# Patient Record
Sex: Male | Born: 1940
Health system: Southern US, Community
[De-identification: ages and names within clinical notes are randomized; demographics above are authoritative.]

## PROBLEM LIST (undated history)

## (undated) DIAGNOSIS — J189 Pneumonia, unspecified organism: Secondary | ICD-10-CM

## (undated) DIAGNOSIS — H9190 Unspecified hearing loss, unspecified ear: Secondary | ICD-10-CM

## (undated) DIAGNOSIS — C61 Malignant neoplasm of prostate: Secondary | ICD-10-CM

## (undated) DIAGNOSIS — G8929 Other chronic pain: Secondary | ICD-10-CM

## (undated) DIAGNOSIS — G4733 Obstructive sleep apnea (adult) (pediatric): Secondary | ICD-10-CM

## (undated) DIAGNOSIS — Z7901 Long term (current) use of anticoagulants: Secondary | ICD-10-CM

## (undated) DIAGNOSIS — I712 Thoracic aortic aneurysm, without rupture, unspecified: Secondary | ICD-10-CM

## (undated) DIAGNOSIS — Z9989 Dependence on other enabling machines and devices: Secondary | ICD-10-CM

## (undated) DIAGNOSIS — M1990 Unspecified osteoarthritis, unspecified site: Secondary | ICD-10-CM

## (undated) DIAGNOSIS — I10 Essential (primary) hypertension: Secondary | ICD-10-CM

## (undated) DIAGNOSIS — I5189 Other ill-defined heart diseases: Secondary | ICD-10-CM

## (undated) DIAGNOSIS — R519 Headache, unspecified: Secondary | ICD-10-CM

## (undated) DIAGNOSIS — J449 Chronic obstructive pulmonary disease, unspecified: Secondary | ICD-10-CM

## (undated) DIAGNOSIS — I509 Heart failure, unspecified: Secondary | ICD-10-CM

## (undated) DIAGNOSIS — K589 Irritable bowel syndrome without diarrhea: Secondary | ICD-10-CM

## (undated) DIAGNOSIS — I82409 Acute embolism and thrombosis of unspecified deep veins of unspecified lower extremity: Secondary | ICD-10-CM

## (undated) DIAGNOSIS — I2699 Other pulmonary embolism without acute cor pulmonale: Secondary | ICD-10-CM

## (undated) DIAGNOSIS — Z460 Encounter for fitting and adjustment of spectacles and contact lenses: Secondary | ICD-10-CM

## (undated) DIAGNOSIS — I480 Paroxysmal atrial fibrillation: Secondary | ICD-10-CM

## (undated) DIAGNOSIS — R0602 Shortness of breath: Secondary | ICD-10-CM

## (undated) DIAGNOSIS — K219 Gastro-esophageal reflux disease without esophagitis: Secondary | ICD-10-CM

## (undated) DIAGNOSIS — M549 Dorsalgia, unspecified: Secondary | ICD-10-CM

## (undated) DIAGNOSIS — Z8711 Personal history of peptic ulcer disease: Secondary | ICD-10-CM

## (undated) DIAGNOSIS — I2510 Atherosclerotic heart disease of native coronary artery without angina pectoris: Secondary | ICD-10-CM

## (undated) DIAGNOSIS — J4520 Mild intermittent asthma, uncomplicated: Secondary | ICD-10-CM

## (undated) DIAGNOSIS — I7121 Aneurysm of the ascending aorta, without rupture: Secondary | ICD-10-CM

## (undated) DIAGNOSIS — I517 Cardiomegaly: Secondary | ICD-10-CM

## (undated) DIAGNOSIS — K635 Polyp of colon: Secondary | ICD-10-CM

## (undated) DIAGNOSIS — E119 Type 2 diabetes mellitus without complications: Secondary | ICD-10-CM

## (undated) DIAGNOSIS — I7789 Other specified disorders of arteries and arterioles: Secondary | ICD-10-CM

## (undated) DIAGNOSIS — R51 Headache: Secondary | ICD-10-CM

## (undated) DIAGNOSIS — Z8719 Personal history of other diseases of the digestive system: Secondary | ICD-10-CM

## (undated) HISTORY — PX: TONSILLECTOMY AND ADENOIDECTOMY: SUR1326

## (undated) HISTORY — DX: Aneurysm of the ascending aorta, without rupture: I71.21

## (undated) HISTORY — DX: Gastro-esophageal reflux disease without esophagitis: K21.9

## (undated) HISTORY — DX: Other specified disorders of arteries and arterioles: I77.89

## (undated) HISTORY — DX: Malignant neoplasm of prostate: C61

## (undated) HISTORY — PX: SHOULDER OPEN ROTATOR CUFF REPAIR: SHX2407

## (undated) HISTORY — DX: Thoracic aortic aneurysm, without rupture, unspecified: I71.20

## (undated) HISTORY — PX: HEMORROIDECTOMY: SUR656

## (undated) HISTORY — PX: CARPAL TUNNEL RELEASE: SHX101

## (undated) HISTORY — DX: Long term (current) use of anticoagulants: Z79.01

## (undated) HISTORY — DX: Other ill-defined heart diseases: I51.89

## (undated) HISTORY — DX: Atherosclerotic heart disease of native coronary artery without angina pectoris: I25.10

## (undated) HISTORY — PX: CATARACT EXTRACTION W/ INTRAOCULAR LENS  IMPLANT, BILATERAL: SHX1307

## (undated) HISTORY — DX: Unspecified osteoarthritis, unspecified site: M19.90

## (undated) HISTORY — PX: BACK SURGERY: SHX140

## (undated) HISTORY — PX: UVULOPALATOPHARYNGOPLASTY: SHX827

## (undated) HISTORY — PX: ACHILLES TENDON REPAIR: SUR1153

## (undated) HISTORY — PX: TRIGGER FINGER RELEASE: SHX641

## (undated) HISTORY — DX: Obstructive sleep apnea (adult) (pediatric): G47.33

## (undated) HISTORY — DX: Thoracic aortic aneurysm, without rupture: I71.2

## (undated) HISTORY — DX: Headache, unspecified: R51.9

## (undated) HISTORY — PX: LAPAROSCOPIC CHOLECYSTECTOMY: SUR755

## (undated) HISTORY — DX: Morbid (severe) obesity due to excess calories: E66.01

## (undated) HISTORY — DX: Mild intermittent asthma, uncomplicated: J45.20

## (undated) HISTORY — DX: Irritable bowel syndrome, unspecified: K58.9

## (undated) HISTORY — DX: Headache: R51

## (undated) HISTORY — DX: Paroxysmal atrial fibrillation: I48.0

## (undated) HISTORY — DX: Other pulmonary embolism without acute cor pulmonale: I26.99

## (undated) HISTORY — DX: Polyp of colon: K63.5

## (undated) HISTORY — DX: Cardiomegaly: I51.7

## (undated) HISTORY — PX: APPENDECTOMY: SHX54

## (undated) HISTORY — PX: VASECTOMY: SHX75

## (undated) HISTORY — DX: Shortness of breath: R06.02

## (undated) HISTORY — DX: Encounter for fitting and adjustment of spectacles and contact lenses: Z46.0

## (undated) HISTORY — PX: LAPAROSCOPIC GASTRIC BANDING: SHX1100

---

## 1998-07-02 ENCOUNTER — Ambulatory Visit (HOSPITAL_COMMUNITY): Admission: RE | Admit: 1998-07-02 | Discharge: 1998-07-02 | Payer: Self-pay | Admitting: Neurosurgery

## 1998-07-02 ENCOUNTER — Encounter: Payer: Self-pay | Admitting: Neurosurgery

## 1998-07-13 ENCOUNTER — Ambulatory Visit (HOSPITAL_COMMUNITY): Admission: RE | Admit: 1998-07-13 | Discharge: 1998-07-13 | Payer: Self-pay | Admitting: Neurosurgery

## 1998-07-13 ENCOUNTER — Encounter: Payer: Self-pay | Admitting: Neurosurgery

## 1998-07-28 ENCOUNTER — Ambulatory Visit (HOSPITAL_COMMUNITY): Admission: RE | Admit: 1998-07-28 | Discharge: 1998-07-28 | Payer: Self-pay | Admitting: Neurosurgery

## 1998-07-28 ENCOUNTER — Encounter: Payer: Self-pay | Admitting: Neurosurgery

## 1998-08-11 ENCOUNTER — Ambulatory Visit (HOSPITAL_COMMUNITY): Admission: RE | Admit: 1998-08-11 | Discharge: 1998-08-11 | Payer: Self-pay | Admitting: Neurosurgery

## 1998-08-11 ENCOUNTER — Encounter: Payer: Self-pay | Admitting: Neurosurgery

## 1998-09-20 ENCOUNTER — Inpatient Hospital Stay (HOSPITAL_COMMUNITY): Admission: EM | Admit: 1998-09-20 | Discharge: 1998-09-25 | Payer: Self-pay | Admitting: Emergency Medicine

## 1998-09-20 ENCOUNTER — Encounter: Payer: Self-pay | Admitting: Emergency Medicine

## 1998-09-21 ENCOUNTER — Encounter: Payer: Self-pay | Admitting: Internal Medicine

## 1998-09-22 ENCOUNTER — Encounter: Payer: Self-pay | Admitting: Internal Medicine

## 1998-10-25 ENCOUNTER — Encounter: Admission: RE | Admit: 1998-10-25 | Discharge: 1999-01-23 | Payer: Self-pay | Admitting: Emergency Medicine

## 1998-12-21 ENCOUNTER — Inpatient Hospital Stay (HOSPITAL_COMMUNITY): Admission: RE | Admit: 1998-12-21 | Discharge: 1998-12-23 | Payer: Self-pay | Admitting: Neurosurgery

## 1998-12-21 ENCOUNTER — Encounter: Payer: Self-pay | Admitting: Neurosurgery

## 1999-07-07 ENCOUNTER — Observation Stay (HOSPITAL_COMMUNITY): Admission: EM | Admit: 1999-07-07 | Discharge: 1999-07-08 | Payer: Self-pay | Admitting: Emergency Medicine

## 1999-07-07 ENCOUNTER — Encounter: Payer: Self-pay | Admitting: Emergency Medicine

## 1999-07-08 ENCOUNTER — Encounter: Payer: Self-pay | Admitting: Internal Medicine

## 1999-07-13 ENCOUNTER — Ambulatory Visit (HOSPITAL_COMMUNITY): Admission: RE | Admit: 1999-07-13 | Discharge: 1999-07-13 | Payer: Self-pay | Admitting: Gastroenterology

## 1999-10-06 ENCOUNTER — Encounter: Admission: RE | Admit: 1999-10-06 | Discharge: 1999-10-06 | Payer: Self-pay | Admitting: Neurosurgery

## 1999-10-06 ENCOUNTER — Encounter: Payer: Self-pay | Admitting: Neurosurgery

## 2000-01-30 ENCOUNTER — Encounter: Payer: Self-pay | Admitting: Neurosurgery

## 2000-01-30 ENCOUNTER — Encounter: Admission: RE | Admit: 2000-01-30 | Discharge: 2000-01-30 | Payer: Self-pay | Admitting: Neurosurgery

## 2000-02-02 ENCOUNTER — Encounter: Admission: RE | Admit: 2000-02-02 | Discharge: 2000-02-02 | Payer: Self-pay | Admitting: Neurosurgery

## 2000-02-02 ENCOUNTER — Encounter: Payer: Self-pay | Admitting: Neurosurgery

## 2000-02-03 ENCOUNTER — Encounter: Payer: Self-pay | Admitting: Neurosurgery

## 2000-02-07 ENCOUNTER — Inpatient Hospital Stay (HOSPITAL_COMMUNITY): Admission: RE | Admit: 2000-02-07 | Discharge: 2000-02-13 | Payer: Self-pay | Admitting: Neurosurgery

## 2000-02-07 ENCOUNTER — Encounter: Payer: Self-pay | Admitting: Neurosurgery

## 2000-02-08 ENCOUNTER — Encounter: Payer: Self-pay | Admitting: Neurosurgery

## 2000-03-13 ENCOUNTER — Encounter: Payer: Self-pay | Admitting: Emergency Medicine

## 2000-03-13 ENCOUNTER — Encounter: Admission: RE | Admit: 2000-03-13 | Discharge: 2000-03-13 | Payer: Self-pay | Admitting: Emergency Medicine

## 2000-06-19 ENCOUNTER — Encounter: Admission: RE | Admit: 2000-06-19 | Discharge: 2000-08-22 | Payer: Self-pay | Admitting: Emergency Medicine

## 2000-12-24 ENCOUNTER — Encounter (HOSPITAL_COMMUNITY): Admission: RE | Admit: 2000-12-24 | Discharge: 2001-03-24 | Payer: Self-pay | Admitting: Cardiology

## 2001-02-19 ENCOUNTER — Encounter: Payer: Self-pay | Admitting: Orthopedic Surgery

## 2001-02-19 ENCOUNTER — Encounter: Admission: RE | Admit: 2001-02-19 | Discharge: 2001-02-19 | Payer: Self-pay | Admitting: Orthopedic Surgery

## 2001-10-31 ENCOUNTER — Ambulatory Visit (HOSPITAL_COMMUNITY): Admission: RE | Admit: 2001-10-31 | Discharge: 2001-10-31 | Payer: Self-pay | Admitting: Gastroenterology

## 2001-10-31 ENCOUNTER — Encounter (INDEPENDENT_AMBULATORY_CARE_PROVIDER_SITE_OTHER): Payer: Self-pay | Admitting: Specialist

## 2002-03-06 ENCOUNTER — Ambulatory Visit (HOSPITAL_COMMUNITY): Admission: RE | Admit: 2002-03-06 | Discharge: 2002-03-06 | Payer: Self-pay | Admitting: Gastroenterology

## 2002-03-06 ENCOUNTER — Encounter (INDEPENDENT_AMBULATORY_CARE_PROVIDER_SITE_OTHER): Payer: Self-pay | Admitting: *Deleted

## 2002-06-06 ENCOUNTER — Encounter: Payer: Self-pay | Admitting: Emergency Medicine

## 2002-06-06 ENCOUNTER — Emergency Department (HOSPITAL_COMMUNITY): Admission: EM | Admit: 2002-06-06 | Discharge: 2002-06-06 | Payer: Self-pay | Admitting: Podiatry

## 2002-07-15 ENCOUNTER — Encounter: Payer: Self-pay | Admitting: Orthopedic Surgery

## 2002-07-15 ENCOUNTER — Encounter: Admission: RE | Admit: 2002-07-15 | Discharge: 2002-07-15 | Payer: Self-pay | Admitting: Orthopedic Surgery

## 2003-01-19 ENCOUNTER — Encounter: Admission: RE | Admit: 2003-01-19 | Discharge: 2003-01-19 | Payer: Self-pay | Admitting: Orthopedic Surgery

## 2003-05-11 ENCOUNTER — Encounter: Admission: RE | Admit: 2003-05-11 | Discharge: 2003-05-11 | Payer: Self-pay | Admitting: Neurosurgery

## 2003-06-01 ENCOUNTER — Encounter: Admission: RE | Admit: 2003-06-01 | Discharge: 2003-06-01 | Payer: Self-pay | Admitting: Neurosurgery

## 2003-06-24 ENCOUNTER — Encounter: Admission: RE | Admit: 2003-06-24 | Discharge: 2003-07-22 | Payer: Self-pay | Admitting: Neurosurgery

## 2004-01-24 HISTORY — PX: CARDIAC CATHETERIZATION: SHX172

## 2004-02-12 ENCOUNTER — Inpatient Hospital Stay (HOSPITAL_BASED_OUTPATIENT_CLINIC_OR_DEPARTMENT_OTHER): Admission: RE | Admit: 2004-02-12 | Discharge: 2004-02-12 | Payer: Self-pay | Admitting: Cardiology

## 2004-12-23 ENCOUNTER — Encounter: Admission: RE | Admit: 2004-12-23 | Discharge: 2004-12-23 | Payer: Self-pay | Admitting: Cardiology

## 2005-01-13 ENCOUNTER — Encounter: Admission: RE | Admit: 2005-01-13 | Discharge: 2005-01-13 | Payer: Self-pay | Admitting: Orthopedic Surgery

## 2005-01-24 ENCOUNTER — Encounter: Admission: RE | Admit: 2005-01-24 | Discharge: 2005-01-24 | Payer: Self-pay | Admitting: Orthopedic Surgery

## 2005-07-04 ENCOUNTER — Ambulatory Visit (HOSPITAL_COMMUNITY): Admission: RE | Admit: 2005-07-04 | Discharge: 2005-07-04 | Payer: Self-pay | Admitting: Neurosurgery

## 2005-07-13 ENCOUNTER — Encounter: Admission: RE | Admit: 2005-07-13 | Discharge: 2005-07-13 | Payer: Self-pay | Admitting: Neurosurgery

## 2005-08-03 ENCOUNTER — Encounter: Admission: RE | Admit: 2005-08-03 | Discharge: 2005-08-03 | Payer: Self-pay | Admitting: Neurosurgery

## 2006-01-23 DIAGNOSIS — I2699 Other pulmonary embolism without acute cor pulmonale: Secondary | ICD-10-CM

## 2006-01-23 HISTORY — DX: Other pulmonary embolism without acute cor pulmonale: I26.99

## 2006-03-08 ENCOUNTER — Inpatient Hospital Stay (HOSPITAL_COMMUNITY): Admission: EM | Admit: 2006-03-08 | Discharge: 2006-03-14 | Payer: Self-pay | Admitting: Emergency Medicine

## 2006-05-05 ENCOUNTER — Encounter: Admission: RE | Admit: 2006-05-05 | Discharge: 2006-05-05 | Payer: Self-pay | Admitting: Orthopedic Surgery

## 2006-08-17 ENCOUNTER — Inpatient Hospital Stay (HOSPITAL_COMMUNITY): Admission: EM | Admit: 2006-08-17 | Discharge: 2006-08-25 | Payer: Self-pay | Admitting: Emergency Medicine

## 2006-08-24 ENCOUNTER — Encounter (INDEPENDENT_AMBULATORY_CARE_PROVIDER_SITE_OTHER): Payer: Self-pay | Admitting: Gastroenterology

## 2006-08-31 ENCOUNTER — Observation Stay (HOSPITAL_COMMUNITY): Admission: EM | Admit: 2006-08-31 | Discharge: 2006-08-31 | Payer: Self-pay | Admitting: Emergency Medicine

## 2006-11-08 ENCOUNTER — Encounter: Admission: RE | Admit: 2006-11-08 | Discharge: 2006-11-08 | Payer: Self-pay | Admitting: Cardiology

## 2006-11-21 ENCOUNTER — Ambulatory Visit (HOSPITAL_COMMUNITY): Admission: RE | Admit: 2006-11-21 | Discharge: 2006-11-21 | Payer: Self-pay | Admitting: Surgery

## 2006-11-27 ENCOUNTER — Encounter: Admission: RE | Admit: 2006-11-27 | Discharge: 2006-11-27 | Payer: Self-pay | Admitting: Surgery

## 2007-02-11 ENCOUNTER — Encounter: Admission: RE | Admit: 2007-02-11 | Discharge: 2007-02-11 | Payer: Self-pay | Admitting: Cardiology

## 2007-05-16 ENCOUNTER — Encounter: Admission: RE | Admit: 2007-05-16 | Discharge: 2007-08-14 | Payer: Self-pay | Admitting: Surgery

## 2007-05-24 ENCOUNTER — Ambulatory Visit: Payer: Self-pay | Admitting: Surgery

## 2007-05-24 ENCOUNTER — Ambulatory Visit (HOSPITAL_COMMUNITY): Admission: RE | Admit: 2007-05-24 | Discharge: 2007-05-24 | Payer: Self-pay | Admitting: Surgery

## 2007-05-27 ENCOUNTER — Ambulatory Visit (HOSPITAL_COMMUNITY): Admission: RE | Admit: 2007-05-27 | Discharge: 2007-05-28 | Payer: Self-pay | Admitting: Surgery

## 2007-07-01 ENCOUNTER — Ambulatory Visit: Payer: Self-pay | Admitting: Surgery

## 2007-07-14 ENCOUNTER — Inpatient Hospital Stay (HOSPITAL_COMMUNITY): Admission: EM | Admit: 2007-07-14 | Discharge: 2007-07-16 | Payer: Self-pay | Admitting: Emergency Medicine

## 2007-08-15 ENCOUNTER — Ambulatory Visit (HOSPITAL_COMMUNITY): Admission: RE | Admit: 2007-08-15 | Discharge: 2007-08-15 | Payer: Self-pay | Admitting: Surgery

## 2007-08-15 ENCOUNTER — Ambulatory Visit: Payer: Self-pay | Admitting: Surgery

## 2007-11-27 ENCOUNTER — Encounter: Admission: RE | Admit: 2007-11-27 | Discharge: 2007-11-27 | Payer: Self-pay | Admitting: Neurosurgery

## 2007-12-11 ENCOUNTER — Inpatient Hospital Stay (HOSPITAL_COMMUNITY): Admission: RE | Admit: 2007-12-11 | Discharge: 2007-12-15 | Payer: Self-pay | Admitting: Neurosurgery

## 2008-08-06 ENCOUNTER — Ambulatory Visit: Payer: Self-pay | Admitting: Pulmonary Disease

## 2008-08-06 DIAGNOSIS — Z8679 Personal history of other diseases of the circulatory system: Secondary | ICD-10-CM | POA: Insufficient documentation

## 2008-08-06 DIAGNOSIS — Z8672 Personal history of thrombophlebitis: Secondary | ICD-10-CM | POA: Insufficient documentation

## 2008-08-06 DIAGNOSIS — I509 Heart failure, unspecified: Secondary | ICD-10-CM | POA: Insufficient documentation

## 2008-08-06 DIAGNOSIS — R51 Headache: Secondary | ICD-10-CM | POA: Insufficient documentation

## 2008-08-06 DIAGNOSIS — G4733 Obstructive sleep apnea (adult) (pediatric): Secondary | ICD-10-CM | POA: Insufficient documentation

## 2008-08-06 DIAGNOSIS — I10 Essential (primary) hypertension: Secondary | ICD-10-CM | POA: Insufficient documentation

## 2008-08-06 DIAGNOSIS — Z86718 Personal history of other venous thrombosis and embolism: Secondary | ICD-10-CM | POA: Insufficient documentation

## 2008-08-06 DIAGNOSIS — R519 Headache, unspecified: Secondary | ICD-10-CM | POA: Insufficient documentation

## 2008-08-11 ENCOUNTER — Encounter: Payer: Self-pay | Admitting: Pulmonary Disease

## 2008-08-24 ENCOUNTER — Encounter: Payer: Self-pay | Admitting: Pulmonary Disease

## 2008-09-11 ENCOUNTER — Encounter: Payer: Self-pay | Admitting: Pulmonary Disease

## 2008-09-16 ENCOUNTER — Encounter: Payer: Self-pay | Admitting: Pulmonary Disease

## 2008-10-14 ENCOUNTER — Encounter: Payer: Self-pay | Admitting: Pulmonary Disease

## 2008-10-19 ENCOUNTER — Encounter: Payer: Self-pay | Admitting: Pulmonary Disease

## 2008-10-21 ENCOUNTER — Ambulatory Visit: Payer: Self-pay | Admitting: Pulmonary Disease

## 2008-10-21 DIAGNOSIS — J4530 Mild persistent asthma, uncomplicated: Secondary | ICD-10-CM | POA: Insufficient documentation

## 2009-04-19 ENCOUNTER — Ambulatory Visit: Payer: Self-pay | Admitting: Pulmonary Disease

## 2009-05-26 ENCOUNTER — Encounter: Admission: RE | Admit: 2009-05-26 | Discharge: 2009-05-26 | Payer: Self-pay | Admitting: Cardiology

## 2009-05-27 ENCOUNTER — Encounter: Admission: RE | Admit: 2009-05-27 | Discharge: 2009-05-27 | Payer: Self-pay | Admitting: Neurosurgery

## 2009-06-01 ENCOUNTER — Encounter: Admission: RE | Admit: 2009-06-01 | Discharge: 2009-06-01 | Payer: Self-pay | Admitting: Neurosurgery

## 2009-06-17 ENCOUNTER — Ambulatory Visit: Payer: Self-pay | Admitting: Cardiothoracic Surgery

## 2009-06-29 ENCOUNTER — Encounter: Admission: RE | Admit: 2009-06-29 | Discharge: 2009-06-29 | Payer: Self-pay | Admitting: Internal Medicine

## 2009-08-10 ENCOUNTER — Encounter: Admission: RE | Admit: 2009-08-10 | Discharge: 2009-08-10 | Payer: Self-pay | Admitting: Neurosurgery

## 2009-08-31 ENCOUNTER — Encounter: Admission: RE | Admit: 2009-08-31 | Discharge: 2009-08-31 | Payer: Self-pay | Admitting: Neurosurgery

## 2009-09-06 ENCOUNTER — Ambulatory Visit: Payer: Self-pay | Admitting: Cardiology

## 2009-09-29 ENCOUNTER — Ambulatory Visit: Payer: Self-pay | Admitting: Cardiology

## 2009-09-29 ENCOUNTER — Ambulatory Visit (HOSPITAL_COMMUNITY): Admission: RE | Admit: 2009-09-29 | Discharge: 2009-09-29 | Payer: Self-pay | Admitting: Cardiology

## 2009-10-01 ENCOUNTER — Encounter: Admission: RE | Admit: 2009-10-01 | Discharge: 2009-10-01 | Payer: Self-pay | Admitting: Cardiology

## 2009-10-11 ENCOUNTER — Ambulatory Visit: Payer: Self-pay | Admitting: Cardiology

## 2009-10-19 ENCOUNTER — Ambulatory Visit: Payer: Self-pay | Admitting: Pulmonary Disease

## 2009-10-22 ENCOUNTER — Encounter: Admission: RE | Admit: 2009-10-22 | Discharge: 2009-10-22 | Payer: Self-pay | Admitting: Surgery

## 2009-10-29 ENCOUNTER — Ambulatory Visit: Payer: Self-pay | Admitting: Cardiology

## 2009-11-15 ENCOUNTER — Encounter: Payer: Self-pay | Admitting: Pulmonary Disease

## 2009-11-16 ENCOUNTER — Encounter
Admission: RE | Admit: 2009-11-16 | Discharge: 2009-11-16 | Payer: Self-pay | Source: Home / Self Care | Attending: Surgery | Admitting: Surgery

## 2009-12-01 ENCOUNTER — Encounter: Payer: Self-pay | Admitting: Pulmonary Disease

## 2009-12-02 ENCOUNTER — Ambulatory Visit: Payer: Self-pay | Admitting: Cardiothoracic Surgery

## 2009-12-07 ENCOUNTER — Ambulatory Visit: Payer: Self-pay | Admitting: Pulmonary Disease

## 2009-12-20 ENCOUNTER — Encounter: Admission: RE | Admit: 2009-12-20 | Discharge: 2009-12-20 | Payer: Self-pay | Admitting: Neurosurgery

## 2010-02-13 ENCOUNTER — Encounter: Payer: Self-pay | Admitting: Neurosurgery

## 2010-02-24 NOTE — Assessment & Plan Note (Signed)
Summary: 6 months/apc   Copy to:  Self Referral- Re- Establish Primary Provider/Referring Provider:  [name]  CC:  OSA follow-up.  The patient states he is doing well on CPAP. He has not been able to wear it since having chest and nasal congestion. He c/o cough with thick yellow mucus..  History of Present Illness: 70 yo male f/u for OSA on BPAP 12/9, and obesity s/l lap band surgery in 2009.  He went to [place] on March 4 for a church mission.  He developed an upper respiratory infection while there, and has not fully recovered.  He has been on two different courses of antibiotics.  He does feel like he is slowly improving.  He continues to have sinus drainage.  He has been using flonase.  He has also been having some wheeze, and chest congestion.  He denies fever, sweats, or hemoptysis.  He has not been able to use his BPAP for the past several weeks due to his sinus problems.  He is sleeping okay.  He is not sure if he is snoring at night.  He feels okay during the day.  He has lost about 10 lbs since his last visit.  Current Medications (verified): 1)  Atenolol 25 Mg Tabs (Atenolol) .... Take 1 Tablet By Mouth Once A Day 2)  Diovan 80 Mg Tabs (Valsartan) .... Take 1 Tablet By Mouth Once A Day 3)  Lasix 80 Mg Tabs (Furosemide) .... Take 2 Tabs By Mouth Daily 4)  Potassium Chloride .... Take 2 Tabs By Mouth Daily 5)  Flomax 0.4 Mg Xr24h-Cap (Tamsulosin Hcl) .... Take 1 Tablet By Mouth Once A Day 6)  Cipro 500 Mg Tabs (Ciprofloxacin Hcl) .Marland Kitchen.. 1 By Mouth Two Times A Day 7)  Claritin-D 12 Hour 5-120 Mg Xr12h-Tab (Loratadine-Pseudoephedrine) .Marland Kitchen.. 1 By Mouth Daily  Allergies (verified): 1)  ! Morphine 2)  ! Adhesive Tape  Past History:  Past Medical History: Reviewed history from 10/21/2008 and no changes required. CURRENT PROBLEMS:  Obstructive sleep apnea      - PSG 03/30/97 AHI 21      - BPAP 12/9 Chronic rhinitis  Chronic hoarseness Coronary artery  disease Hypertension Diastolic heart failure DVT, PE Pneumonia GERD Peptic ulcer disease Esophageal stricture Hiatal hernia Irritable bowel syndrome Chronic headaches Osteoarthritis Cervical spine disease  Past Surgical History: Reviewed history from 08/06/2008 and no changes required. hemorrhoidectomy in 1963 appendectomy in 1967 tonsillectomy at age 40 Posterior cervical diskectomy 1989 bilateral rotator cuff repair 1995 Left elbow spur 1995 carpal tunnel repair, right hand Cholecystectomy 1994 status post vasectomy Uvulopharygoplasty in 1990's L4-L5 laminectomy 2000 L3-S1 surgery 2002, 2009 Lap band 2009  Vital Signs:  Patient profile:   70 year old male Height:      67 inches (170.18 cm) Weight:      270 pounds (122.73 kg) BMI:     42.44 O2 Sat:      97 % on Room air Temp:     97.7 degrees F (36.50 degrees C) oral Pulse rate:   74 / minute BP sitting:   122 / 76  (left arm) Cuff size:   large  Vitals Entered By: Michel Bickers CMA (April 19, 2009 1:28 PM)  O2 Sat at Rest %:  97 O2 Flow:  Room air  Physical Exam  Nose:  boggy, clear, non tender Mouth:  MP 3, no oral lesion, triangular uvula Neck:  no JVD, no thyromegaly Lungs:  b/l wheeze Lt > Rt at bases  clear partially with cough Heart:  regular rate and rhythm, S1, S2 without murmurs, rubs, gallops, or clicks Extremities:  no clubbing, cyanosis, edema, or deformity noted Cervical Nodes:  no significant adenopathy   Impression & Recommendations:  Problem # 1:  OBSTRUCTIVE SLEEP APNEA (ICD-327.23)  He is to resume BPAP once his sinus problems improve.  I have advised him to continue with his weight loss.  He may need to have this re-evaluated if he continues to lose weight.   Problem # 2:  DYSPNEA (ICD-786.05)  He may have a component of asthma exacerbated by recent upper airway infection.  I have given him a sample and script for xopenex.  He may need additional therapy for asthma, depending on his  response.  Advised him to follow up with his primary physician, or he could call for further pulmonary assessment of this.  Medications Added to Medication List This Visit: 1)  Xopenex Hfa 45 Mcg/act Aero (Levalbuterol tartrate) .... Two puffs up to four times per day as needed 2)  Claritin-d 12 Hour 5-120 Mg Xr12h-tab (Loratadine-pseudoephedrine) .Marland Kitchen.. 1 by mouth daily 3)  Flonase 50 Mcg/act Susp (Fluticasone propionate) .... Two sprays once daily as needed 4)  Cipro 500 Mg Tabs (Ciprofloxacin hcl) .Marland Kitchen.. 1 by mouth two times a day  Complete Medication List: 1)  Atenolol 25 Mg Tabs (Atenolol) .... Take 1 tablet by mouth once a day 2)  Diovan 80 Mg Tabs (Valsartan) .... Take 1 tablet by mouth once a day 3)  Lasix 80 Mg Tabs (Furosemide) .... Take 2 tabs by mouth daily 4)  Potassium Chloride  .... Take 2 tabs by mouth daily 5)  Flomax 0.4 Mg Xr24h-cap (Tamsulosin hcl) .... Take 1 tablet by mouth once a day 6)  Xopenex Hfa 45 Mcg/act Aero (Levalbuterol tartrate) .... Two puffs up to four times per day as needed 7)  Claritin-d 12 Hour 5-120 Mg Xr12h-tab (Loratadine-pseudoephedrine) .Marland Kitchen.. 1 by mouth daily 8)  Flonase 50 Mcg/act Susp (Fluticasone propionate) .... Two sprays once daily as needed 9)  Cipro 500 Mg Tabs (Ciprofloxacin hcl) .Marland Kitchen.. 1 by mouth two times a day  Other Orders: Est. Patient Level III (04540)  Patient Instructions: 1)  Xopenex two puffs up to four times per day as needed  2)  Follow up in 6 months Prescriptions: XOPENEX HFA 45 MCG/ACT AERO (LEVALBUTEROL TARTRATE) two puffs up to four times per day as needed  #1 x 2   Entered and Authorized by:   Coralyn Helling MD   Signed by:   Coralyn Helling MD on 04/19/2009   Method used:   Print then Give to Patient   RxID:   [id number]

## 2010-02-24 NOTE — Assessment & Plan Note (Signed)
Summary: 2-3 months/apc   Copy to:    Primary Provider/Referring Provider:  Lajean Manes  CC:  2-3 month OSA follow-up.  Pt says he is doing well on Bipap.  Wakes up feeling fatigued on rare occasions..  History of Present Illness: 70 yo male f/u for OSA on BPAP 12/9, and obesity s/l lap band surgery in 2009.  He has been doing well with BPAP.  He has a full face mask.  He gets mouth dryness at night, but this was present even before he started BPAP.  He sleeps well, and has more energy level.  He is continuing with his weight loss regimen.  [place]  He was at about 4000 [place] elevation.  He has some trouble with his breathing while walking up stairs.  He did not have this problem after returning to lower elevation.    Current Medications (verified): 1)  Atenolol 25 Mg Tabs (Atenolol) .... Take 1 Tablet By Mouth Once A Day 2)  Diovan 80 Mg Tabs (Valsartan) .... Take 1 Tablet By Mouth Once A Day 3)  Lasix 80 Mg Tabs (Furosemide) .... Take 2 Tabs By Mouth Daily 4)  Potassium Chloride .... Take 2 Tabs By Mouth Daily 5)  Flomax 0.4 Mg Xr24h-Cap (Tamsulosin Hcl) .... Take 1 Tablet By Mouth Once A Day  Allergies (verified): 1)  ! Morphine 2)  ! Adhesive Tape  Past History:  Past Medical History: CURRENT PROBLEMS:  Obstructive sleep apnea      - PSG 03/30/97 AHI 21      - BPAP 12/9 Chronic rhinitis  Chronic hoarseness Coronary artery disease Hypertension Diastolic heart failure DVT, PE Pneumonia GERD Peptic ulcer disease Esophageal stricture Hiatal hernia Irritable bowel syndrome Chronic headaches Osteoarthritis Cervical spine disease  Past Surgical History: Reviewed history from 08/06/2008 and no changes required. hemorrhoidectomy in 1963 appendectomy in 1967 tonsillectomy at age 32 Posterior cervical diskectomy 1989 bilateral rotator cuff repair 1995 Left elbow spur 1995 carpal tunnel repair, right hand Cholecystectomy 1994  status post vasectomy Uvulopharygoplasty in 1990's L4-L5 laminectomy 2000 L3-S1 surgery 2002, 2009 Lap band 2009  Family History: Reviewed history from 08/06/2008 and no changes required. emphysema-father (also had brown lung)  heart disease-sister  cancer-brother(started in bladder) sister (unsure what kind)   Social History: Reviewed history from 08/06/2008 and no changes required. Married.  Has 3 sons.  Retired from [employer].  Smoked 2.5 to 3 packs per day, started at age 80 and quit in 89.  Vital Signs:  Patient profile:   70 year old male Height:      67 inches (170.18 cm) Weight:      276 pounds (125.45 kg) BMI:     43.38 O2 Sat:      94 % on Room air Temp:     97.7 degrees F (36.50 degrees C) oral Pulse rate:   58 / minute BP sitting:   118 / 66  (left arm) Cuff size:   large  Vitals Entered By: Michel Bickers CMA (October 21, 2008 1:45 PM)  O2 Flow:  Room air  Physical Exam  General:  obese.   Nose:  no deformity, discharge, inflammation, or lesions Mouth:  MP 3, no oral lesion Neck:  no JVD, no thyromegaly Lungs:  clear bilaterally to auscultation and percussion Heart:  regular rate and rhythm, S1, S2 without murmurs, rubs, gallops, or clicks Abdomen:  bowel sounds positive; abdomen soft and non-tender without masses, or organomegaly Extremities:  no  clubbing, cyanosis, edema, or deformity noted Cervical Nodes:  no significant adenopathy   Impression & Recommendations:  Problem # 1:  OBSTRUCTIVE SLEEP APNEA (ICD-327.23)  Continue on BPAP.  He is to continue with his weight loss regimen  Problem # 2:  DYSPNEA (ICD-786.05) My suspicion is this was from being at elevation coupled with a degree of deconditioning.  I have advised him to contact his PCP if this recurs.  Complete Medication List: 1)  Atenolol 25 Mg Tabs (Atenolol) .... Take 1 tablet by mouth once a day 2)  Diovan 80 Mg Tabs (Valsartan) .... Take 1 tablet by mouth once a day 3)   Lasix 80 Mg Tabs (Furosemide) .... Take 2 tabs by mouth daily 4)  Potassium Chloride  .... Take 2 tabs by mouth daily 5)  Flomax 0.4 Mg Xr24h-cap (Tamsulosin hcl) .... Take 1 tablet by mouth once a day  Other Orders: Est. Patient Level II (29562)  Patient Instructions: 1)  Follow up in 6 months

## 2010-02-24 NOTE — Miscellaneous (Signed)
Summary: BPAP 12/9 download 10/27/09 to 11/15/09  Clinical Lists Changes Used on 19 of 20 nights with average 7hrs 12 min.    Average AHI 4.3 with minimal airleak with BPAP 12/9.    Will have my nurse call to inform pt that BPAP report looked good, and sleep apnea is controlled well with BPAP.  He should continue to use BPAP on a regular basis.  Appended Document: BPAP 12/9 download 10/27/09 to 11/15/09 LMOMTCB.  Appended Document: BPAP 12/9 download 10/27/09 to 11/15/09 PAtient is aware bpap report was good and he should continue wearing on a regular basis per VS.

## 2010-02-24 NOTE — Miscellaneous (Signed)
Summary: Auto-BPAP download 08/11/08 to 08/24/08.  Clinical Lists Changes Used on 13 of 14 days with average 6 hrs 9 min.  Optimal pressure 12/9 cm H2O.  Will set at this pressure, get repeat download on set pressure, and have ONO done. Orders: Added new Referral order of DME Referral (DME) - Signed

## 2010-02-24 NOTE — Procedures (Signed)
Summary: Oximetry/[facility]  Oximetry/[facility]   Imported By: Sherian Rein 10/07/2008 09:12:24  _____________________________________________________________________  External Attachment:    Type:   Image     Comment:   External Document

## 2010-02-24 NOTE — Assessment & Plan Note (Signed)
Summary: 2 MONTHS/ MBW   Visit Type:  Follow-up Copy to:  Hilda Lias, Roger Shelter, Sheliah Plane Primary Provider/Referring Provider:  Johnella Moloney  CC:  OSA follow-up...review vpap download...pt c/o moisture inside his mask.  History of Present Illness: 70 yo male f/u for OSA on BPAP 13/9, and obesity s/l lap band surgery in 2009.  He has been sleeping better.  He is using his BPAP about 6 hours per night.  He goes to bed at 11pm, and wakes up at 5am.  He stays in bed until 6am.  He gets sleepy in the afternoon and will take a brief nap.  He has been getting trouble with water build up in his mask, but has been working with his DME.  He is using a full face mask and this has been working okay.  BPAP download from 11/16/09 to 12/01/09: Used on 11 of 16 nights.  Average 4hrs 28 min per night.  Optimal pressure 13/9 with average AHI 6.  Minimal airleak.  Current Medications (verified): 1)  Atenolol 25 Mg Tabs (Atenolol) .... Take 1 Tablet By Mouth Once A Day 2)  Valturna 150-160 Mg Tabs (Aliskiren-Valsartan) .Marland Kitchen.. 1 By Mouth Two Times A Day 3)  Lasix 80 Mg Tabs (Furosemide) .... Take 2 Tabs By Mouth Daily 4)  Klor-Con M20 20 Meq Cr-Tabs (Potassium Chloride Crys Cr) .... 2 By Mouth Two Times A Day 5)  Flomax 0.4 Mg Xr24h-Cap (Tamsulosin Hcl) .... Take 1 Tablet By Mouth Once A Day 6)  Xopenex Hfa 45 Mcg/act Aero (Levalbuterol Tartrate) .... Two Puffs Up To Four Times Per Day As Needed 7)  Flonase 50 Mcg/act Susp (Fluticasone Propionate) .... Two Sprays Once Daily As Needed 8)  Amlodipine Besylate 10 Mg Tabs (Amlodipine Besylate) .Marland Kitchen.. 1 By Mouth Daily 9)  Pantoprazole Sodium 40 Mg Tbec (Pantoprazole Sodium) .Marland Kitchen.. 1 By Mouth Daily As Needed 10)  Finasteride 5 Mg Tabs (Finasteride) .Marland Kitchen.. 1 By Mouth Daily  Allergies (verified): 1)  ! Morphine 2)  ! Adhesive Tape  Past History:  Past Medical History: CURRENT PROBLEMS:  Obstructive sleep apnea      - PSG 03/30/97 AHI 21      -  BPAP 13/9 Chronic rhinitis  Chronic hoarseness Coronary artery disease Hypertension Diastolic heart failure DVT, PE Ascending thoracic aortic aneurysm>>Dr. [name] Pneumonia GERD Peptic ulcer disease Esophageal stricture Hiatal hernia Irritable bowel syndrome Chronic headaches Osteoarthritis Cervical spine disease  Past Surgical History: Reviewed history from 08/06/2008 and no changes required. hemorrhoidectomy in 1963 appendectomy in 1967 tonsillectomy at age 12 Posterior cervical diskectomy 1989 bilateral rotator cuff repair 1995 Left elbow spur 1995 carpal tunnel repair, right hand Cholecystectomy 1994 status post vasectomy Uvulopharygoplasty in 1990's L4-L5 laminectomy 2000 L3-S1 surgery 2002, 2009 Lap band 2009  Vital Signs:  Patient profile:   70 year old male Height:      67 inches (170.18 cm) Weight:      276 pounds (125.45 kg) BMI:     43.38 O2 Sat:      95 % on Room air Temp:     98.1 degrees F (36.72 degrees C) oral Pulse rate:   56 / minute BP sitting:   120 / 76  (left arm) Cuff size:   large  Vitals Entered By: Michel Bickers CMA (December 07, 2009 3:33 PM)  O2 Sat at Rest %:  95 O2 Flow:  Room air CC: OSA follow-up...review vpap download...pt c/o moisture inside his mask Comments Medications reviewed with patient Michel Bickers  CMA  December 07, 2009 3:34 PM   Physical Exam  General:  obese.   Nose:  clear, non tender Mouth:  MP 3, no oral lesion, triangular uvula Neck:  no JVD.   Lungs:  clear bilaterally to auscultation and percussion Heart:  regular rate and rhythm, S1, S2 without murmurs, rubs, gallops, or clicks Extremities:  no clubbing, cyanosis, edema, or deformity noted Neurologic:  normal CN II-XII and strength normal.   Cervical Nodes:  no significant adenopathy Psych:  alert and cooperative; normal mood and affect; normal attention span and concentration   Impression & Recommendations:  Problem # 1:  OBSTRUCTIVE SLEEP APNEA  (ICD-327.23)  He has done better with BPAP.  His recent download shows good control.  Will increase his settings to 13/9.  He has been working with his DME about adjusting rain out in his mask.  He is being evaluated for back surgery.  I don't think his sleep apnea would prohibit him from having surgery.  He will need to continue BPAP during the post-operative period.  He will need to have close monitoring of his oxygen during the post-operative period.  Pulmonary service can be available as needed in the post-operative period.  Medications Added to Medication List This Visit: 1)  Klor-con M20 20 Meq Cr-tabs (Potassium chloride crys cr) .... 2 by mouth two times a day 2)  Pantoprazole Sodium 40 Mg Tbec (Pantoprazole sodium) .Marland Kitchen.. 1 by mouth daily as needed 3)  Finasteride 5 Mg Tabs (Finasteride) .Marland Kitchen.. 1 by mouth daily  Complete Medication List: 1)  Atenolol 25 Mg Tabs (Atenolol) .... Take 1 tablet by mouth once a day 2)  Valturna 150-160 Mg Tabs (Aliskiren-valsartan) .Marland Kitchen.. 1 by mouth two times a day 3)  Lasix 80 Mg Tabs (Furosemide) .... Take 2 tabs by mouth daily 4)  Klor-con M20 20 Meq Cr-tabs (Potassium chloride crys cr) .... 2 by mouth two times a day 5)  Flomax 0.4 Mg Xr24h-cap (Tamsulosin hcl) .... Take 1 tablet by mouth once a day 6)  Xopenex Hfa 45 Mcg/act Aero (Levalbuterol tartrate) .... Two puffs up to four times per day as needed 7)  Flonase 50 Mcg/act Susp (Fluticasone propionate) .... Two sprays once daily as needed 8)  Amlodipine Besylate 10 Mg Tabs (Amlodipine besylate) .Marland Kitchen.. 1 by mouth daily 9)  Pantoprazole Sodium 40 Mg Tbec (Pantoprazole sodium) .Marland Kitchen.. 1 by mouth daily as needed 10)  Finasteride 5 Mg Tabs (Finasteride) .Marland Kitchen.. 1 by mouth daily  Other Orders: Est. Patient Level III (11914) DME Referral (DME)  Patient Instructions: 1)  Follow up in 6 months   Immunization History:  Influenza Immunization History:    Influenza:  historical (09/23/2009)

## 2010-02-24 NOTE — Assessment & Plan Note (Signed)
Summary: rov 6 months///kp   Visit Type:  Follow-up Copy to:  Roger Shelter, Sheliah Plane Primary Provider/Referring Provider:  Johnella Moloney  CC:  OSA 6 month follow-up...has not worn cpap in 2 months...he says it dries him out too bad...c/o SOB.  History of Present Illness: 70 yo male f/u for OSA on BPAP 12/9, and obesity s/l lap band surgery in 2009.  He has not used his BPAP for the past two months.  He was getting dryness in his mouth.  He has a full face mask, and has tried adjusting the humidity on his machine.  He did not notice problem with air leak.  His sleep has gotten worse and he is more tired during the day since he stopped using BPAP.  He was told that he has an aneurysm in his chest, and he was started on extra blood pressure medications recently.  Current Medications (verified): 1)  Atenolol 25 Mg Tabs (Atenolol) .... Take 1 Tablet By Mouth Once A Day 2)  Valturna 150-160 Mg Tabs (Aliskiren-Valsartan) .Marland Kitchen.. 1 By Mouth Two Times A Day 3)  Lasix 80 Mg Tabs (Furosemide) .... Take 2 Tabs By Mouth Daily 4)  Potassium Chloride .... Take 2 Tabs By Mouth Daily 5)  Flomax 0.4 Mg Xr24h-Cap (Tamsulosin Hcl) .... Take 1 Tablet By Mouth Once A Day 6)  Xopenex Hfa 45 Mcg/act Aero (Levalbuterol Tartrate) .... Two Puffs Up To Four Times Per Day As Needed 7)  Flonase 50 Mcg/act Susp (Fluticasone Propionate) .... Two Sprays Once Daily As Needed 8)  Amlodipine Besylate 10 Mg Tabs (Amlodipine Besylate) .Marland Kitchen.. 1 By Mouth Daily  Allergies (verified): 1)  ! Morphine 2)  ! Adhesive Tape  Past History:  Past Medical History: CURRENT PROBLEMS:  Obstructive sleep apnea      - PSG 03/30/97 AHI 21      - BPAP 12/9 Chronic rhinitis  Chronic hoarseness Coronary artery disease Hypertension Diastolic heart failure DVT, PE Ascending thoracic aortic aneurysm>>Dr. [name] Pneumonia GERD Peptic ulcer disease Esophageal stricture Hiatal hernia Irritable bowel syndrome Chronic  headaches Osteoarthritis Cervical spine disease  Past Surgical History: Reviewed history from 08/06/2008 and no changes required. hemorrhoidectomy in 1963 appendectomy in 1967 tonsillectomy at age 44 Posterior cervical diskectomy 1989 bilateral rotator cuff repair 1995 Left elbow spur 1995 carpal tunnel repair, right hand Cholecystectomy 1994 status post vasectomy Uvulopharygoplasty in 1990's L4-L5 laminectomy 2000 L3-S1 surgery 2002, 2009 Lap band 2009  Vital Signs:  Patient profile:   70 year old male Height:      67 inches (170.18 cm) Weight:      275 pounds (125.00 kg) BMI:     43.23 O2 Sat:      93 % on Room air Temp:     97.6 degrees F (36.44 degrees C) oral Pulse rate:   71 / minute BP sitting:   122 / 80  (left arm) Cuff size:   large  Vitals Entered By: Michel Bickers CMA (October 19, 2009 1:28 PM)  O2 Sat at Rest %:  93 O2 Flow:  Room air CC: OSA 6 month follow-up...has not worn cpap in 2 months...he says it dries him out too bad...c/o SOB Comments Medications reviewed with patient Daytime phone verified. Michel Bickers [facility]  October 19, 2009 1:29 PM   Physical Exam  General:  obese.   Nose:  boggy, clear, non tender Mouth:  MP 3, no oral lesion, triangular uvula Neck:  no JVD.   Lungs:  clear bilaterally to auscultation  and percussion Heart:  regular rate and rhythm, S1, S2 without murmurs, rubs, gallops, or clicks Extremities:  no clubbing, cyanosis, edema, or deformity noted Neurologic:  normal CN II-XII and strength normal.   Cervical Nodes:  no significant adenopathy Psych:  alert and cooperative; normal mood and affect; normal attention span and concentration   Impression & Recommendations:  Problem # 1:  OBSTRUCTIVE SLEEP APNEA (ICD-327.23) I would like to try him again on BPAP.  Will arrange for auto titration at home to see if he requires different pressure setting or if he has mask leak.  If he is not able to tolerate PAP therapy, then may  need to consider oral appliance.  I explained how untreated OSA can affect his hypertension and thoracic aortic aneurysm.  Medications Added to Medication List This Visit: 1)  Valturna 150-160 Mg Tabs (Aliskiren-valsartan) .Marland Kitchen.. 1 by mouth two times a day 2)  Amlodipine Besylate 10 Mg Tabs (Amlodipine besylate) .Marland Kitchen.. 1 by mouth daily  Complete Medication List: 1)  Atenolol 25 Mg Tabs (Atenolol) .... Take 1 tablet by mouth once a day 2)  Valturna 150-160 Mg Tabs (Aliskiren-valsartan) .Marland Kitchen.. 1 by mouth two times a day 3)  Lasix 80 Mg Tabs (Furosemide) .... Take 2 tabs by mouth daily 4)  Potassium Chloride  .... Take 2 tabs by mouth daily 5)  Flomax 0.4 Mg Xr24h-cap (Tamsulosin hcl) .... Take 1 tablet by mouth once a day 6)  Xopenex Hfa 45 Mcg/act Aero (Levalbuterol tartrate) .... Two puffs up to four times per day as needed 7)  Flonase 50 Mcg/act Susp (Fluticasone propionate) .... Two sprays once daily as needed 8)  Amlodipine Besylate 10 Mg Tabs (Amlodipine besylate) .Marland Kitchen.. 1 by mouth daily  Other Orders: Est. Patient Level III (10272) DME Referral (DME)  Patient Instructions: 1)  Will set up test for BPAP machine at home 2)  Follow up in 2 to 3 months

## 2010-02-26 ENCOUNTER — Emergency Department (HOSPITAL_COMMUNITY): Payer: 59

## 2010-02-26 ENCOUNTER — Inpatient Hospital Stay (HOSPITAL_COMMUNITY)
Admission: EM | Admit: 2010-02-26 | Discharge: 2010-02-28 | DRG: 310 | Disposition: A | Discharge status: Home / Self Care | Payer: 59 | Attending: Cardiology | Admitting: Cardiology

## 2010-02-26 DIAGNOSIS — I10 Essential (primary) hypertension: Secondary | ICD-10-CM | POA: Diagnosis present

## 2010-02-26 DIAGNOSIS — Z86718 Personal history of other venous thrombosis and embolism: Secondary | ICD-10-CM

## 2010-02-26 DIAGNOSIS — I714 Abdominal aortic aneurysm, without rupture, unspecified: Secondary | ICD-10-CM | POA: Diagnosis present

## 2010-02-26 DIAGNOSIS — Z886 Allergy status to analgesic agent status: Secondary | ICD-10-CM

## 2010-02-26 DIAGNOSIS — Z860100 Personal history of colon polyps, unspecified: Secondary | ICD-10-CM

## 2010-02-26 DIAGNOSIS — G4730 Sleep apnea, unspecified: Secondary | ICD-10-CM | POA: Diagnosis present

## 2010-02-26 DIAGNOSIS — R0789 Other chest pain: Secondary | ICD-10-CM

## 2010-02-26 DIAGNOSIS — I4891 Unspecified atrial fibrillation: Principal | ICD-10-CM | POA: Diagnosis present

## 2010-02-26 DIAGNOSIS — Z9884 Bariatric surgery status: Secondary | ICD-10-CM

## 2010-02-26 DIAGNOSIS — R002 Palpitations: Secondary | ICD-10-CM

## 2010-02-26 DIAGNOSIS — Z7901 Long term (current) use of anticoagulants: Secondary | ICD-10-CM

## 2010-02-26 HISTORY — DX: Essential (primary) hypertension: I10

## 2010-02-26 LAB — POCT I-STAT, CHEM 8
BUN: 14 mg/dL (ref 6–23)
Calcium, Ion: 1.09 mmol/L — ABNORMAL LOW (ref 1.12–1.32)
Chloride: 106 mEq/L (ref 96–112)
Creatinine, Ser: 0.9 mg/dL (ref 0.4–1.5)
Glucose, Bld: 114 mg/dL — ABNORMAL HIGH (ref 70–99)
HCT: 45 % (ref 39.0–52.0)
Hemoglobin: 15.3 g/dL (ref 13.0–17.0)
Potassium: 3.3 mEq/L — ABNORMAL LOW (ref 3.5–5.1)
Sodium: 142 mEq/L (ref 135–145)
TCO2: 25 mmol/L (ref 0–100)

## 2010-02-27 ENCOUNTER — Emergency Department (HOSPITAL_COMMUNITY): Payer: 59

## 2010-02-27 ENCOUNTER — Encounter (HOSPITAL_COMMUNITY): Payer: Self-pay | Admitting: Radiology

## 2010-02-27 LAB — DIFFERENTIAL
Basophils Absolute: 0.1 10*3/uL (ref 0.0–0.1)
Basophils Relative: 1 % (ref 0–1)
Eosinophils Absolute: 0.2 10*3/uL (ref 0.0–0.7)
Eosinophils Relative: 2 % (ref 0–5)
Lymphocytes Relative: 33 % (ref 12–46)
Lymphs Abs: 2.7 10*3/uL (ref 0.7–4.0)
Monocytes Absolute: 0.7 10*3/uL (ref 0.1–1.0)
Monocytes Relative: 9 % (ref 3–12)
Neutro Abs: 4.4 10*3/uL (ref 1.7–7.7)
Neutrophils Relative %: 55 % (ref 43–77)

## 2010-02-27 LAB — PROTIME-INR
INR: 1.02 (ref 0.00–1.49)
Prothrombin Time: 13.6 seconds (ref 11.6–15.2)

## 2010-02-27 LAB — CARDIAC PANEL(CRET KIN+CKTOT+MB+TROPI)
CK, MB: 2.4 ng/mL (ref 0.3–4.0)
CK, MB: 2.6 ng/mL (ref 0.3–4.0)
Relative Index: 2.2 (ref 0.0–2.5)
Relative Index: 1.9 (ref 0.0–2.5)
Total CK: 111 U/L (ref 7–232)
Total CK: 134 U/L (ref 7–232)
Troponin I: 0.01 ng/mL (ref 0.00–0.06)
Troponin I: 0.01 ng/mL (ref 0.00–0.06)

## 2010-02-27 LAB — CBC
HCT: 43.8 % (ref 39.0–52.0)
Hemoglobin: 15 g/dL (ref 13.0–17.0)
MCH: 29.1 pg (ref 26.0–34.0)
MCHC: 34.2 g/dL (ref 30.0–36.0)
MCV: 84.9 fL (ref 78.0–100.0)
Platelets: 157 10*3/uL (ref 150–400)
RBC: 5.16 MIL/uL (ref 4.22–5.81)
RDW: 12.9 % (ref 11.5–15.5)
WBC: 8.1 10*3/uL (ref 4.0–10.5)

## 2010-02-27 LAB — CK TOTAL AND CKMB (NOT AT ARMC)
CK, MB: 2.7 ng/mL (ref 0.3–4.0)
Relative Index: 2.1 (ref 0.0–2.5)
Total CK: 130 U/L (ref 7–232)

## 2010-02-27 LAB — ABO/RH: ABO/RH(D): O POS

## 2010-02-27 LAB — TYPE AND SCREEN
ABO/RH(D): O POS
Antibody Screen: NEGATIVE

## 2010-02-27 LAB — POCT CARDIAC MARKERS
CKMB, poc: 1.6 ng/mL (ref 1.0–8.0)
Myoglobin, poc: 102 ng/mL (ref 12–200)
Troponin i, poc: <0.05 ng/mL (ref 0.00–0.09)

## 2010-02-27 LAB — BASIC METABOLIC PANEL
BUN: 11 mg/dL (ref 6–23)
BUN: 12 mg/dL (ref 6–23)
CO2: 25 mEq/L (ref 19–32)
CO2: 25 mEq/L (ref 19–32)
Calcium: 8.8 mg/dL (ref 8.4–10.5)
Calcium: 9 mg/dL (ref 8.4–10.5)
Chloride: 103 mEq/L (ref 96–112)
Chloride: 104 mEq/L (ref 96–112)
Creatinine, Ser: 0.87 mg/dL (ref 0.4–1.5)
Creatinine, Ser: 0.88 mg/dL (ref 0.4–1.5)
GFR calc Af Amer: >60 mL/min (ref >60)
GFR calc Af Amer: >60 mL/min (ref >60)
GFR calc non Af Amer: >60 mL/min (ref >60)
GFR calc non Af Amer: >60 mL/min (ref >60)
Glucose, Bld: 115 mg/dL — ABNORMAL HIGH (ref 70–99)
Glucose, Bld: 116 mg/dL — ABNORMAL HIGH (ref 70–99)
Potassium: 3.5 mEq/L (ref 3.5–5.1)
Potassium: 3.5 mEq/L (ref 3.5–5.1)
Sodium: 139 mEq/L (ref 135–145)
Sodium: 140 mEq/L (ref 135–145)

## 2010-02-27 LAB — TSH
TSH: 3.556 u[IU]/mL (ref 0.350–4.500)
TSH: 1.356 u[IU]/mL (ref 0.350–4.500)

## 2010-02-27 LAB — HEPARIN LEVEL (UNFRACTIONATED)
Heparin Unfractionated: 0.11 IU/mL — ABNORMAL LOW (ref 0.30–0.70)
Heparin Unfractionated: 0.28 IU/mL — ABNORMAL LOW (ref 0.30–0.70)

## 2010-02-27 LAB — TROPONIN I: Troponin I: 0.02 ng/mL (ref 0.00–0.06)

## 2010-02-27 MED ORDER — IOHEXOL 350 MG/ML SOLN
100.0000 mL | Freq: Once | INTRAVENOUS | Status: AC | PRN
  Administered 2010-02-27: 100 mL via INTRAVENOUS

## 2010-02-28 DIAGNOSIS — I4891 Unspecified atrial fibrillation: Secondary | ICD-10-CM

## 2010-02-28 LAB — CBC
HCT: 42.2 % (ref 39.0–52.0)
Hemoglobin: 13.9 g/dL (ref 13.0–17.0)
MCH: 28.1 pg (ref 26.0–34.0)
MCHC: 32.9 g/dL (ref 30.0–36.0)
MCV: 85.3 fL (ref 78.0–100.0)
Platelets: 136 10*3/uL — ABNORMAL LOW (ref 150–400)
RBC: 4.95 MIL/uL (ref 4.22–5.81)
RDW: 13 % (ref 11.5–15.5)
WBC: 6.8 10*3/uL (ref 4.0–10.5)

## 2010-02-28 LAB — BASIC METABOLIC PANEL
BUN: 11 mg/dL (ref 6–23)
CO2: 29 mEq/L (ref 19–32)
Calcium: 9.1 mg/dL (ref 8.4–10.5)
Chloride: 104 mEq/L (ref 96–112)
Creatinine, Ser: 0.94 mg/dL (ref 0.4–1.5)
GFR calc Af Amer: >60 mL/min (ref >60)
GFR calc non Af Amer: >60 mL/min (ref >60)
Glucose, Bld: 149 mg/dL — ABNORMAL HIGH (ref 70–99)
Potassium: 4 mEq/L (ref 3.5–5.1)
Sodium: 141 mEq/L (ref 135–145)

## 2010-02-28 LAB — CARDIAC PANEL(CRET KIN+CKTOT+MB+TROPI)
CK, MB: 2.2 ng/mL (ref 0.3–4.0)
Relative Index: 1.5 (ref 0.0–2.5)
Total CK: 149 U/L (ref 7–232)
Troponin I: <0.01 ng/mL (ref 0.00–0.06)

## 2010-02-28 LAB — HEPARIN LEVEL (UNFRACTIONATED): Heparin Unfractionated: 0.36 IU/mL (ref 0.30–0.70)

## 2010-02-28 LAB — PROTIME-INR
INR: 1.04 (ref 0.00–1.49)
Prothrombin Time: 13.8 seconds (ref 11.6–15.2)

## 2010-03-02 ENCOUNTER — Other Ambulatory Visit (INDEPENDENT_AMBULATORY_CARE_PROVIDER_SITE_OTHER): Payer: 59

## 2010-03-02 DIAGNOSIS — I712 Thoracic aortic aneurysm, without rupture, unspecified: Secondary | ICD-10-CM

## 2010-03-02 DIAGNOSIS — Z7901 Long term (current) use of anticoagulants: Secondary | ICD-10-CM

## 2010-03-04 ENCOUNTER — Other Ambulatory Visit (INDEPENDENT_AMBULATORY_CARE_PROVIDER_SITE_OTHER): Payer: 59

## 2010-03-04 DIAGNOSIS — Z7901 Long term (current) use of anticoagulants: Secondary | ICD-10-CM

## 2010-03-04 DIAGNOSIS — I712 Thoracic aortic aneurysm, without rupture: Secondary | ICD-10-CM

## 2010-03-08 ENCOUNTER — Other Ambulatory Visit (INDEPENDENT_AMBULATORY_CARE_PROVIDER_SITE_OTHER): Payer: 59

## 2010-03-08 DIAGNOSIS — Z7901 Long term (current) use of anticoagulants: Secondary | ICD-10-CM

## 2010-03-08 DIAGNOSIS — I712 Thoracic aortic aneurysm, without rupture: Secondary | ICD-10-CM

## 2010-03-14 NOTE — Discharge Summary (Signed)
NAME:  Carlos Dawson, SHEVCHENKO NO.:  [id number]  MEDICAL RECORD NO.:  [id number]           PATIENT TYPE:  I  LOCATION:  3731                         FACILITY:  [facility]  PHYSICIAN:  Duke Salvia, MD, FACCDATE OF BIRTH:  10/05/1940  DATE OF ADMISSION:  02/26/2010 DATE OF DISCHARGE:  02/28/2010                              DISCHARGE SUMMARY   PRIMARY CARDIOLOGIST:  Colleen Can. Deborah Chalk, MD  PRIMARY CARE PROVIDER:  Candyce Churn, MD  DISCHARGE DIAGNOSIS:  Atrial fibrillation/flutter with rapid ventricular response.  SECONDARY DIAGNOSES: 1. History of atypical chest pain with negative Myoview. 2. Stable abdominal aortic aneurysm, followed by Dr. [name]. 3. Hypertension. 4. History of pulmonary embolus. 5. History of colonic polyps. 6. Sleep apnea, treated with BiPAP. 7. Small soft tissue attenuating nodule in the small bowel mesentery     noted on CT scan this admission, may represent a lymph node. 8. Status post gastric bypass surgery. 9. Status post lumbar laminectomy. 10.Status post cervical fusion. 11.Status post bilateral shoulder surgeries.  ALLERGIES:  MORPHINE.  PROCEDURES:  None.  HISTORY OF PRESENT ILLNESS:  A 70 year old male with above problem list. He does have a history of chest pain with prior nonobstructive catheterization in 2006 and more recently negative Myoview performed in the outpatient setting about 6 months ago.  The patient was in his usual state of health until the evening of February 27, 2010, when he was sitting and watching TV and had sudden onset of tachy palpitations associated with moderate chest discomfort, radiating to his back, associated with nausea and mild dyspnea.  He presented to the [facility] ED where he was felt to be in 2:1 atrial flutter with rapid response in the 130s which subsequently converted spontaneously.  The patient's point-of-care markers were negative x1.  Given prior history of PE, a CT of his  chest was performed which showed no evidence of PE along with a stable thoracic aneurysm.  There was a new 1.5-cm soft tissue attenuation nodule in the small bowel mesentery which was felt to potentially represent a lymph node.  The patient was admitted for the evaluation.  HOSPITAL COURSE:  Cardiac enzymes have been negative.  The patient has had no recurrence of atrial arrhythmias or chest pain.  With a CHADS VASC score of 2 for age and hypertension, with newly diagnosed atrial arrhythmias, it was felt to be appropriate to initiate anticoagulation with Coumadin.  The patient had mild dyspnea and volume overload on exam and was treated with IV Lasix this morning.  His home dose of Lasix will be resumed and he will likely be discharged home later this afternoon.  DISCHARGE LABS:  Hemoglobin 13.0, hematocrit 42.2, WBC 6.8, platelets 136.  Sodium 141, potassium 4.0, chloride 104, CO2 29, BUN 11, creatinine 0.94, glucose 149, calcium 9.1.  CK 129, MB 2.2, troponin I less than 0.01.  TSH 1.356.  DISPOSITION:  The patient will be discharged home today in good condition.  FOLLOWUP PLANS AND APPOINTMENTS:  We have arranged followup in the Coumadin Clinic at [facility] on March 03, 2010, at 11 a.m. Follow up with Dr. Deborah Chalk  on March 21, 2010, 10:45 a.m.  DISCHARGE MEDICATIONS: 1. Coumadin 7.5 mg at bedtime. 2. Atenolol 25 mg daily. 3. Finasteride 5 mg daily. 4. Flomax 0.4 mg at bedtime. 5. Fluticasone (nasal) 50 mcg 2 sprays at bedtime. 6. Furosemide 80 mg b.i.d. 7. Klor-Con 20 mEq 2 tabs b.i.d. 8. Protonix 40 mg daily. 9. Systane over the counter 2 drops both eyes daily p.r.n. 10.Valturna 150/160 mg b.i.d. 11.Vitamin B12 daily. 12.Vitamin D 2000 units 2 tabs daily.  OUTSTANDING LAB STUDIES:  Follow up INR on March 03, 2010.  DURATION OF DISCHARGE ENCOUNTER:  60 minutes including physician time.     Nicolasa Ducking,  ANP   ______________________________ Duke Salvia, MD, [facility]    CB/MEDQ  D:  02/28/2010  T:  03/01/2010  Job:  [id number]  cc:   Candyce Churn, M.D.  Electronically Signed by Nicolasa Ducking ANP on 03/07/2010 12:03:58 PM Electronically Signed by Sherryl Manges MD [facility] on 03/14/2010 09:58:14 PM

## 2010-03-16 ENCOUNTER — Other Ambulatory Visit (INDEPENDENT_AMBULATORY_CARE_PROVIDER_SITE_OTHER): Payer: 59

## 2010-03-16 DIAGNOSIS — Z7901 Long term (current) use of anticoagulants: Secondary | ICD-10-CM

## 2010-03-16 DIAGNOSIS — I4891 Unspecified atrial fibrillation: Secondary | ICD-10-CM

## 2010-03-16 DIAGNOSIS — I712 Thoracic aortic aneurysm, without rupture: Secondary | ICD-10-CM

## 2010-03-21 ENCOUNTER — Ambulatory Visit (INDEPENDENT_AMBULATORY_CARE_PROVIDER_SITE_OTHER): Payer: 59 | Admitting: Cardiology

## 2010-03-21 DIAGNOSIS — Z7901 Long term (current) use of anticoagulants: Secondary | ICD-10-CM

## 2010-03-21 DIAGNOSIS — I4891 Unspecified atrial fibrillation: Secondary | ICD-10-CM

## 2010-03-28 ENCOUNTER — Encounter (INDEPENDENT_AMBULATORY_CARE_PROVIDER_SITE_OTHER): Payer: 59

## 2010-03-28 DIAGNOSIS — Z7901 Long term (current) use of anticoagulants: Secondary | ICD-10-CM

## 2010-03-28 DIAGNOSIS — I4891 Unspecified atrial fibrillation: Secondary | ICD-10-CM

## 2010-04-04 ENCOUNTER — Ambulatory Visit: Payer: 59 | Admitting: Nurse Practitioner

## 2010-04-06 ENCOUNTER — Other Ambulatory Visit: Payer: 59

## 2010-04-07 ENCOUNTER — Other Ambulatory Visit (INDEPENDENT_AMBULATORY_CARE_PROVIDER_SITE_OTHER): Payer: 59

## 2010-04-07 DIAGNOSIS — Z7901 Long term (current) use of anticoagulants: Secondary | ICD-10-CM

## 2010-04-07 DIAGNOSIS — I4891 Unspecified atrial fibrillation: Secondary | ICD-10-CM

## 2010-04-07 NOTE — H&P (Signed)
NAME:  Carlos Dawson, Carlos Dawson NO.:  [id number]  MEDICAL RECORD NO.:  [id number]           PATIENT TYPE:  LOCATION:                                 FACILITY:  PHYSICIAN:  Rollene Rotunda, MD, FACCDATE OF BIRTH:  [birth year]  DATE OF ADMISSION: DATE OF DISCHARGE:                             HISTORY & PHYSICAL   PRIMARY CARE PROVIDER:  Candyce Churn, MD  CARDIOLOGIST:  Colleen Can. Deborah Chalk, MD  REASON FOR PRESENTATION:  Evaluate the patient with chest discomfort and palpitations.  HISTORY OF PRESENT ILLNESS:  The patient is a 70 year old white gentleman with a past history of chest discomfort.  He reports two cardiac catheterizations in the past, by his report normal coronaries. He says he has had stress tests with Dr. Deborah Chalk, last one being about 6 months ago, that were negative.  He has had no arrhythmias.  Yesterday evening, he was sitting, watching TV with his wife.  He developed some chest discomfort.  He also felt his heart beating rapidly in his chest. He took his heart rate and it was in the 130s.  The chest discomfort was similar to previous pain that he has had.  It was somewhat moderate, radiated through to his back, it was substernal.  He was slightly nauseated and short of breath.  He presented to the emergency room via private vehicle.  He had been noted to have 2:1 atrial flutter which converted spontaneously.  He is currently pain free.  He has, otherwise, been relatively sedentary though he does some light house chores.  With this, he does not get any chest pressure, neck, or arm discomfort.  He has had some dyspnea with exertion but this has been chronic and related to his sedentary lifestyle and his weight.  He has had no PND or orthopnea.  He has, otherwise, had no palpitations, presyncope, or syncope.  He has had no weight gain or edema.  He has had no fevers, chills, or cough. Of note, the patient's point-of-care markers x1 were negative.   He did have a CT of his chest that demonstrated a stable thoracic aneurysm, no dissection, no pulmonary embolism.  There was a new 1.5-cm mesenteric lymph node noted.  PAST MEDICAL HISTORY: 1. Ascending thoracic aortic aneurysm (followed by Thoracic Surgery     and stable at 4.7 x 4.8). 2. Hypertension. 3. Pulmonary embolism in the past. 4. Colonic polyps. 5. Sleep apnea, treated with BiPAP.  PAST SURGICAL HISTORY: 1. Gastric bypass surgery. 2. Lumbar laminectomy. 3. Cervical fusion. 4. Bilateral shoulder surgery.  ALLERGIES/INTOLERANCES:  MORPHINE.  MEDICATIONS:  Fluticasone, Systane, vitamin B12, vitamin D, finasteride 5 mg nightly, Flomax 0.4 mg nightly, pantoprazole 40 mg daily, Valturna 150/160 b.i.d., atenolol 25 mg q.a.m., furosemide 80 mg b.i.d., Klor-Con 40 mEq b.i.d.  SOCIAL HISTORY:  The patient is married.  He quit smoking 19 years ago. He does not drink alcohol.  FAMILY HISTORY:  Contributory for his sister dying suddenly of myocardial infarction at age 59.  He has a son with an ASD repair as achild and a bicuspid aortic valve.  REVIEW OF SYSTEMS:  Positive for chronic  back pain.  Otherwise, as stated in the HPI, negative for all other systems.  PHYSICAL EXAMINATION:  GENERAL:  The patient is in no distress. VITAL SIGNS:  Blood pressure 118/83, heart rate 70 and regular, respiratory rate 16, afebrile. HEENT:  Eyelids unremarkable, pupils equal, round, and reactive to light.  Fundi not visualized.  Oral mucosa unremarkable. NECK:  No jugular venous distention at 45 degrees, carotid upstroke brisk and symmetric, no bruits, no thyromegaly. LYMPHATICS:  No cervical, axillary, or inguinal adenopathy. LUNGS:  Clear to auscultation bilaterally. BACK:  No costovertebral angle tenderness. CHEST:  Unremarkable. HEART:  PMI not displaced or sustained, S1 and S2 within normal limits, no S3, no S4, no clicks, no rubs, no murmurs. ABDOMEN:  Obese, positive bowel sounds,  normal in frequency and pitch, no bruits, no rebound, no guarding, no midline pulsatile mass, no hepatomegaly, no splenomegaly. SKIN:  No rashes, no nodules. EXTREMITIES:  A 2+ pulses throughout, no edema, no cyanosis, no clubbing. NEUROLOGIC:  Oriented to person, place, and time, cranial nerves II through XII grossly intact, motor grossly intact.  LABORATORY DATA:  Sodium 139, potassium 3.5, BUN 11, creatinine 0.88. Point-of-care markers negative x1.  WBC 8.1, hemoglobin 15.0, platelets 157.  EKG; atrial flutter with 2:1 conduction, axis within normal, intervals within normal limits, no acute ST-T wave changes.  ASSESSMENT AND PLAN: 1. Atrial flutter.  The patient had 2:1 atrial flutter that was self-     limited.  For now, I will start him on heparin.  He will continue     his low-dose beta-blocker and this might be titrated if his blood     pressure allows.  I would like to check an echocardiogram.  With     the chest discomfort he had, I will cycle cardiac enzymes as well.     If these are negative and he truly has had a recent negative stress     test, I would not pursue further coronary artery disease workup.  I     will check a TSH. 2. Mesenteric lymph node.  The patient will need followup of this as     this is a new finding. 3. Sleep apnea.  I will have him continue his home BiPAP. 4. Thoracic aneurysm.  This was followed today and is stable.     Rollene Rotunda, MD, [facility]     JH/MEDQ  D:  02/27/2010  T:  02/27/2010  Job:  [id number]  Electronically Signed by Rollene Rotunda MD [facility] on 04/07/2010 09:54:08 AM

## 2010-04-08 ENCOUNTER — Other Ambulatory Visit: Payer: Self-pay | Admitting: Neurosurgery

## 2010-04-08 DIAGNOSIS — M549 Dorsalgia, unspecified: Secondary | ICD-10-CM

## 2010-04-11 ENCOUNTER — Encounter: Payer: Self-pay | Admitting: Nurse Practitioner

## 2010-04-11 ENCOUNTER — Ambulatory Visit
Admission: RE | Admit: 2010-04-11 | Discharge: 2010-04-11 | Disposition: A | Discharge status: Home / Self Care | Payer: 59 | Source: Ambulatory Visit | Attending: Neurosurgery | Admitting: Neurosurgery

## 2010-04-11 ENCOUNTER — Ambulatory Visit (INDEPENDENT_AMBULATORY_CARE_PROVIDER_SITE_OTHER): Payer: 59 | Admitting: Nurse Practitioner

## 2010-04-11 DIAGNOSIS — M169 Osteoarthritis of hip, unspecified: Secondary | ICD-10-CM | POA: Insufficient documentation

## 2010-04-11 DIAGNOSIS — R079 Chest pain, unspecified: Secondary | ICD-10-CM

## 2010-04-11 DIAGNOSIS — I712 Thoracic aortic aneurysm, without rupture: Secondary | ICD-10-CM | POA: Insufficient documentation

## 2010-04-11 DIAGNOSIS — I10 Essential (primary) hypertension: Secondary | ICD-10-CM | POA: Insufficient documentation

## 2010-04-11 DIAGNOSIS — I2699 Other pulmonary embolism without acute cor pulmonale: Secondary | ICD-10-CM

## 2010-04-11 DIAGNOSIS — M549 Dorsalgia, unspecified: Secondary | ICD-10-CM

## 2010-04-11 DIAGNOSIS — I517 Cardiomegaly: Secondary | ICD-10-CM

## 2010-04-11 DIAGNOSIS — I480 Paroxysmal atrial fibrillation: Secondary | ICD-10-CM | POA: Insufficient documentation

## 2010-04-11 DIAGNOSIS — Z7901 Long term (current) use of anticoagulants: Secondary | ICD-10-CM

## 2010-04-11 DIAGNOSIS — R0602 Shortness of breath: Secondary | ICD-10-CM

## 2010-04-11 DIAGNOSIS — G4730 Sleep apnea, unspecified: Secondary | ICD-10-CM

## 2010-04-11 DIAGNOSIS — M1990 Unspecified osteoarthritis, unspecified site: Secondary | ICD-10-CM | POA: Insufficient documentation

## 2010-04-11 DIAGNOSIS — I2510 Atherosclerotic heart disease of native coronary artery without angina pectoris: Secondary | ICD-10-CM | POA: Insufficient documentation

## 2010-04-22 ENCOUNTER — Other Ambulatory Visit: Payer: Self-pay | Admitting: Cardiology

## 2010-04-25 ENCOUNTER — Encounter: Payer: 59 | Admitting: *Deleted

## 2010-05-09 ENCOUNTER — Ambulatory Visit (INDEPENDENT_AMBULATORY_CARE_PROVIDER_SITE_OTHER): Payer: 59 | Admitting: *Deleted

## 2010-05-09 DIAGNOSIS — Z7901 Long term (current) use of anticoagulants: Secondary | ICD-10-CM

## 2010-05-09 DIAGNOSIS — I2699 Other pulmonary embolism without acute cor pulmonale: Secondary | ICD-10-CM

## 2010-05-09 LAB — POCT INR: INR: 3.2

## 2010-05-11 ENCOUNTER — Telehealth: Payer: Self-pay | Admitting: Cardiology

## 2010-05-11 NOTE — Telephone Encounter (Signed)
RN set pt up for INR.  Pt aware.

## 2010-05-11 NOTE — Telephone Encounter (Signed)
Needs to get INR checked due to having surg. Out of town on 5/10. When to stop coumadin

## 2010-05-18 ENCOUNTER — Ambulatory Visit: Payer: 59 | Admitting: Cardiology

## 2010-05-20 ENCOUNTER — Other Ambulatory Visit (INDEPENDENT_AMBULATORY_CARE_PROVIDER_SITE_OTHER): Payer: 59 | Admitting: *Deleted

## 2010-05-20 ENCOUNTER — Ambulatory Visit (INDEPENDENT_AMBULATORY_CARE_PROVIDER_SITE_OTHER): Payer: 59 | Admitting: *Deleted

## 2010-05-20 DIAGNOSIS — D689 Coagulation defect, unspecified: Secondary | ICD-10-CM

## 2010-05-20 DIAGNOSIS — Z01812 Encounter for preprocedural laboratory examination: Secondary | ICD-10-CM

## 2010-05-20 DIAGNOSIS — Z7901 Long term (current) use of anticoagulants: Secondary | ICD-10-CM

## 2010-05-20 DIAGNOSIS — I2699 Other pulmonary embolism without acute cor pulmonale: Secondary | ICD-10-CM

## 2010-05-20 DIAGNOSIS — Z79899 Other long term (current) drug therapy: Secondary | ICD-10-CM

## 2010-05-20 LAB — CBC WITH DIFFERENTIAL/PLATELET
Basophils Absolute: 0 10*3/uL (ref 0.0–0.1)
Basophils Relative: 0.4 % (ref 0.0–3.0)
Eosinophils Absolute: 0.1 10*3/uL (ref 0.0–0.7)
Eosinophils Relative: 1.6 % (ref 0.0–5.0)
HCT: 41.2 % (ref 39.0–52.0)
Hemoglobin: 14.3 g/dL (ref 13.0–17.0)
Lymphocytes Relative: 27.7 % (ref 12.0–46.0)
Lymphs Abs: 1.8 10*3/uL (ref 0.7–4.0)
MCHC: 34.5 g/dL (ref 30.0–36.0)
MCV: 88.5 fl (ref 78.0–100.0)
Monocytes Absolute: 0.6 10*3/uL (ref 0.1–1.0)
Monocytes Relative: 8.4 % (ref 3.0–12.0)
Neutro Abs: 4.1 10*3/uL (ref 1.4–7.7)
Neutrophils Relative %: 61.9 % (ref 43.0–77.0)
Platelets: 157 10*3/uL (ref 150.0–400.0)
RBC: 4.66 Mil/uL (ref 4.22–5.81)
RDW: 13.9 % (ref 11.5–14.6)
WBC: 6.6 10*3/uL (ref 4.5–10.5)

## 2010-05-20 LAB — HEPATIC FUNCTION PANEL
ALT: 20 U/L (ref 0–53)
AST: 20 U/L (ref 0–37)
Albumin: 3.8 g/dL (ref 3.5–5.2)
Alkaline Phosphatase: 95 U/L (ref 39–117)
Bilirubin, Direct: 0.1 mg/dL (ref 0.0–0.3)
Total Bilirubin: 0.8 mg/dL (ref 0.3–1.2)
Total Protein: 6.5 g/dL (ref 6.0–8.3)

## 2010-05-20 LAB — POCT INR: INR: 3

## 2010-05-20 LAB — BASIC METABOLIC PANEL
BUN: 15 mg/dL (ref 6–23)
CO2: 29 mEq/L (ref 19–32)
Calcium: 8.8 mg/dL (ref 8.4–10.5)
Chloride: 107 mEq/L (ref 96–112)
Creatinine, Ser: 0.7 mg/dL (ref 0.4–1.5)
GFR: 111.59 mL/min (ref >60.00)
Glucose, Bld: 113 mg/dL — ABNORMAL HIGH (ref 70–99)
Potassium: 3.7 mEq/L (ref 3.5–5.1)
Sodium: 144 mEq/L (ref 135–145)

## 2010-05-20 LAB — APTT: aPTT: 63.2 s — ABNORMAL HIGH (ref 21.7–28.8)

## 2010-05-24 ENCOUNTER — Telehealth: Payer: Self-pay | Admitting: Cardiology

## 2010-05-24 NOTE — Telephone Encounter (Signed)
Pt notified of lab results and to continue same medications.  RN faxed labs to [facility] at fax #[phone] per pt's request.

## 2010-05-24 NOTE — Telephone Encounter (Signed)
Wanted to know if you faxed his test results to someone in [place].  He would not go into any detail.

## 2010-05-24 NOTE — Telephone Encounter (Signed)
Message copied by Barnetta Hammersmith on Tue May 24, 2010  1:14 PM ------      Message from: Roger Shelter      Created: Fri May 20, 2010  3:04 PM       OK and      And

## 2010-05-25 ENCOUNTER — Other Ambulatory Visit (INDEPENDENT_AMBULATORY_CARE_PROVIDER_SITE_OTHER): Payer: 59 | Admitting: *Deleted

## 2010-05-25 DIAGNOSIS — Z7901 Long term (current) use of anticoagulants: Secondary | ICD-10-CM

## 2010-05-25 DIAGNOSIS — I2699 Other pulmonary embolism without acute cor pulmonale: Secondary | ICD-10-CM

## 2010-05-25 LAB — POCT INR: INR: 3.1

## 2010-05-26 ENCOUNTER — Ambulatory Visit: Payer: Self-pay | Admitting: *Deleted

## 2010-05-26 NOTE — Patient Instructions (Signed)
Pt notified of Coumadin instructions and verbalized to RN understanding of instructions.  Pt will call back after back surgery to set up day and time for next INR.

## 2010-05-30 ENCOUNTER — Encounter: Payer: 59 | Admitting: *Deleted

## 2010-06-02 HISTORY — PX: CERVICAL SPINE SURGERY: SHX589

## 2010-06-06 ENCOUNTER — Telehealth: Payer: Self-pay | Admitting: *Deleted

## 2010-06-06 ENCOUNTER — Ambulatory Visit: Payer: 59 | Admitting: Cardiology

## 2010-06-06 NOTE — Telephone Encounter (Signed)
Pt called, would like to know when to resume Coumadin after back surgery on 06/02/10.  Pt states he had complications which caused significant spinal fluid to leak out.   Pt would like to know when to resume Coumadin.  Norma Fredrickson NP notified and instructed RN to have pt ask his back surgeon when to resume Coumadin.  Pt notified of Norma Fredrickson NP instructions and will call his Surgeon's office ASAP.

## 2010-06-07 NOTE — Consult Note (Signed)
NAME:  Carlos Dawson, Carlos Dawson NO.:  [id number]   MEDICAL RECORD NO.:  [id number]          PATIENT TYPE:  INP   LOCATION:  2013                         FACILITY:  [facility]   PHYSICIAN:  Graylin Shiver, M.D.   DATE OF BIRTH:  04-Mar-1940   DATE OF CONSULTATION:  08/22/2006  DATE OF DISCHARGE:                                 CONSULTATION   REASON FOR CONSULTATION:  The patient is a 70 year old male with a  history of recent bilateral pulmonary emboli.  He was treated for that  and was put on Coumadin.   HISTORY OF PRESENT ILLNESS:  His pulmonary embolus occurred in February  of this year and he had been taking Coumadin up until about 10 days  prior to admission.  The Coumadin at that time was stopped for him to  have a prostate biopsy.  The patient reports that he did well after his  prostate biopsy and the tissue was benign.   He came into the hospital on August 17, 2006 with shortness of breath.  There was concern that he might have recurrence of pulmonary emboli, but  this did not pan out.  The patient was put back on Coumadin two days  ago; but last night he had a couple of episodes of rectal bleeding.  Dr.  Dartha Lodge called me in consultation because of the rectal bleeding.  He  wanted to make sure there was nothing significant in the colon to keep  the patient from being on chronic anticoagulation.   PAST MEDICAL HISTORY:  1. Obstructive sleep apnea.  2. Congestive heart failure.  3. Hypertension.  4. Spinal stenosis.  5. GE reflux.  6. Pulmonary embolus.  7. History of esophageal stricture with dilation in the past.  8. Hiatal hernia.  9. Depression.  10.Allergic rhinitis.   PAST SURGICAL HISTORY:  1. Appendectomy.  2. Cholecystectomy.  3. Cervical diskectomy.  4. L4-L5 laminectomy.  5. Rotator cuff.  6. Foot surgery.  7. Elbow surgery.   SOCIAL HISTORY:  He does not smoke, or drink alcohol.   REVIEW OF SYSTEMS:  Currently no chest pain, shortness of  breath.   PHYSICAL EXAMINATION:  GENERAL APPEARANCE:  In no distress, nonicteric.  HEART:  Regular rhythm.  LUNGS:  Clear.  ABDOMEN:  Soft, nontender.  No obvious hepatosplenomegaly.   IMPRESSION:  Recent rectal bleeding; rule out colon lesion.  The patient  could have bled from the prostate biopsy site.   PLAN:  I will schedule him for a colonoscopy a couple of days after  preparation.  We want to make sure that there is nothing significant in  the colon to keep the patient from being on his Coumadin.           ______________________________  Graylin Shiver, M.D.     SFG/MEDQ  D:  08/22/2006  T:  08/23/2006  Job:  [id number]   cc:   Barnetta Chapel, MD

## 2010-06-07 NOTE — H&P (Signed)
NAME:  Carlos Dawson, Carlos Dawson NO.:  [id number]   MEDICAL RECORD NO.:  [id number]          PATIENT TYPE:  INP   LOCATION:  3302                         FACILITY:  [facility]   PHYSICIAN:  Corinna L. Lendell Caprice, MDDATE OF BIRTH:  02/15/1940   DATE OF ADMISSION:  08/17/2006  DATE OF DISCHARGE:                              HISTORY & PHYSICAL   CHIEF COMPLAINT:  Shortness of breath.   HISTORY OF PRESENT ILLNESS:  [name] is a pleasant 70 year old white  male patient of Dr. Georgina Pillion who presented to the emergency room with  respiratory distress.  Apparently he was initially put on BiPAP.  He  does take BiPAP nightly for obstructive sleep apnea.  He denied any  cough. In the emergency room, he was found to have a temperature of 103.  He had a CAT scan which was negative for pulmonary embolus, and they  gave him a dose of Cipro. The ED physician was going to send the patient  home.  However, he got up to use the restroom and became acutely short  of breath again and very weak.  Therefore, I am asked to admit for  further evaluation and management. Currently the patient denies any  shortness of breath.  He had a prostate biopsy yesterday.  He denies any  dysuria or hematuria.  He has had a rash in the groin area for the past  month, but he has no other new rashes.  He has had no sick contacts.  He  has had no nausea, vomiting or diarrhea.  He has a history of congestive  heart failure secondary to diastolic dysfunction.  There is some mention  on previous dictations that he has a history of coronary artery disease.  His last cardiac catheterization in January 2006 shows no significant  coronary artery disease but a markedly enlarged aortic root with a  deformed aortic valve but no aortic insufficiency.   PAST MEDICAL HISTORY:  1. Obstructive sleep apnea.  2. History of congestive heart failure secondary to diastolic      dysfunction.  3. Hypertension.  4. Spinal stenosis for which  he received a steroid injection a month      ago by Dr. Jeral Fruit.  5. History of left foot surgery with subsequent infection requiring IV      antibiotics.  6. Gastroesophageal reflux disease.  7. Pulmonary embolus in February 2006.  His Coumadin had been stopped      10 days ago prior to the prostate biopsy,  8. History of esophageal strictures with dilatation.  9. Hiatal hernia.  10.History of depression.  11.History of allergic rhinitis   PAST SURGICAL HISTORY:  1. Appendectomy.  2. Cholecystectomy.  3. Cervical diskectomy.  4. L4-L5 laminectomy.  5. Bilateral rotator cuff surgery.  6. Foot surgery.  7. Elbow surgery.   SOCIAL HISTORY:  Patient is married.  He does not smoke or drink.   MEDICATIONS:  1. BiPAP nightly.  2. Atenolol 50 mg twice a day.  3. Lasix 160 mg twice a day.  4. Prilosec 20 mg a day.  5. KCl 60 mEq  twice a day.  6. Coumadin was stopped 10 days ago but had been taking 5 mg a day.   ALLERGIES:  Reports an allergy to MORPHINE which causes itching and  rash.   FAMILY HISTORY:  Is noncontributory.   REVIEW OF SYSTEMS:  CONSTITUTIONAL:  He had pills today.  No weight  loss, no weight gain.  HEENT:  No headache, no sinus congestion or pain.  No sore throat.  No rhinorrhea.  RESPIRATORY:  As above.  CARDIOVASCULAR:  No chest pain or palpitations.  GI:  As above.  GU:  As  above.  MUSCULOSKELETAL:  He reports that over the past few days he has  had aches in his thighs and proximal arms with no joint pains.  SKIN:  As above.  Apparently he has been treated for what sounds like  intertrigo with Nystatin cream, but apparently this has failed to clear  up the rash in his groin.  Apparently he had some rash in his axillae,  and this has cleared.  This is being followed by Dr. Terri Piedra.  HEMATOLOGIC:  As above.  PSYCHIATRIC:  As above.  NEUROLOGIC:  No  history of stroke or seizure.  ENDOCRINE:  No diabetes or thyroid  issues.   PHYSICAL EXAMINATION:  VITAL  SIGNS:  Temperature was 103.2, blood  pressure 120/57, pulse 91.  Respiratory rate 25, oxygen saturation 100%.  GENERAL:  The patient is an obese white male in no acute distress on  nasal cannula oxygen.  He was able to speak in full sentences.  HEENT:  Normocephalic, atraumatic.  Pupils equal, round, and reactive to  light.  Moist mucous membranes.  Oropharynx is without erythema or  exudate.  Tympanic membranes clear.  NECK:  Thick, supple.  No lymphadenopathy.  LUNGS:  Clear to auscultation bilaterally without wheezes, rhonchi or  rales.  CARDIOVASCULAR:  Regular rate and rhythm without murmurs, gallops or  rubs.  ABDOMEN:  Obese, soft, nontender.  GU AND RECTAL:  Were deferred.  EXTREMITIES:  No clubbing or cyanosis.  He has 1+ pitting edema.  Pulses  are intact.  NEUROLOGIC:  Alert and oriented.  Cranial nerves and sensorimotor exam  intact.  PSYCHIATRIC:  Normal affect.  SKIN: He has erythematous plaques in the groin area.  MUSCULOSKELETAL:  Joint exam unremarkable.  BACK:  He has no point tenderness along his spine.  No CVA tenderness.   LABORATORY DATA:  White blood cell count is 10.9 with 90% neutrophils,  8% lymphocytes, otherwise unremarkable.  INR is 1.1. D-dimer 6.9.  Basic  metabolic panel significant for glucose of 178, otherwise unremarkable.  Myoglobin 245 with CPK-MB and troponin normal.  BNP is 76.  Urinalysis  shows urine glucose of 250, specific gravity 1.011, pH 5.5, negative  bilirubin, negative ketones, small blood, negative protein, negative  nitrite, negative leukocyte esterase, 0-2 white cells, 3-6 red cells.   Chest x-ray shows cardiomegaly with bibasilar edema or infiltrate.   CT angiogram of the chest showed no acute pulmonary embolus but  suboptimal evaluation for peripheral emboli due to early bolus timing,  left greater than right lower lobe atelectasis versus scarring, stable  ascending aortic aneurysm without dissection.   EKG shows normal  sinus rhythm and nonspecific changes.   ASSESSMENT/PLAN:  1. Dyspnea:  This has resolved.  The etiology is not entirely clear.      He has been started on a heparin drip.  I will convert to Lovenox      and will  resume his Coumadin. Will continue oxygen as needed.      Certainly he appears to be in no respiratory distress at this time.      Continue Lasix.Marland Kitchen  He does not appear to have significant heart      failure at this time.  2. Fever:  Etiology is unclear.  I will start empiric antibiotics and      follow up culture results.  3. Obesity.  4. Obstructive sleep apnea.  5. Hypertension.  6. Recent prostate biopsy.  7. Gastroesophageal reflux disease.  8. Intertrigo.  9. Spinal stenosis.      Corinna L. Lendell Caprice, MD  Electronically Signed     CLS/MEDQ  D:  08/18/2006  T:  08/19/2006  Job:  [id number]   cc:   Oley Balm. Georgina Pillion, M.D.  Ronald L. Earlene Plater, M.D.  Colleen Can. Deborah Chalk, M.D.  Barbaraann Share, MD,FCCP  Hilda Lias, M.D.  Frederick A. Worthy Rancher, M.D.

## 2010-06-07 NOTE — Discharge Summary (Signed)
NAME:  Carlos Dawson, Carlos Dawson              ACCOUNT NO.:  [id number]   MEDICAL RECORD NO.:  [id number]          PATIENT TYPE:  INP   LOCATION:  1611                         FACILITY:  [facility]   PHYSICIAN:  Corinna L. Lendell Caprice, MDDATE OF BIRTH:  1940/05/29   DATE OF ADMISSION:  08/30/2006  DATE OF DISCHARGE:  08/31/2006                               DISCHARGE SUMMARY   DISCHARGE DIAGNOSES:  1. Prostatitis.  2. Hypertension.  3. Obesity  4. History of pulmonary embolus in February 2008, suspect may be able      to discontinue Coumadin soon but defer to primary care physician.  5. Obstructive sleep apnea  6. Recent admission for fever and systemic inflammatory response      syndrome.  7. History of congestive heart failure secondary to diastolic      dysfunction.  8. Spinal stenosis.  9. Gastroesophageal reflux disease.  10.History of esophageal stricture with dilatation.  11.History of depression.  12.History of apparently severe intertrigo.  13.Prostate biopsy on August 16, 2006.  Results not in the computer but,      according to Dr. Earlene Plater, no neoplasm  14.History of hematochezia.  Colonoscopy on August 24, 2006, by Dr.      Evette Cristal showed polyps, redness, and ecchymoses at the rectum from      prior prostate biopsy. No evidence of active bleeding.   DISCHARGE MEDICATIONS:  1. Flomax 0.4 mg nightly.  2. Ciprofloxacin 500 mg p.o. b.i.d. for 2 weeks or as per Dr. Earlene Plater.  3. Peridium 200 mg 3 times a day after meals for 6 doses.  Continue the rest of his outpatient medications which include.  1. Coumadin 5 mg nightly.  2. Omeprazole 20 mg nightly.  3. Nefazodone.  4. Potassium chloride 60 mEq p.o.3 times a day.  5. Valsartan 80 mg a day.  6. Lasix 160 mg twice a day.  7. Atenolol 25 mg a day.  8. Nystatin cream and powder to groin twice a day.  9. Dilaudid 2-4 mg every 4 hours as needed p.o. p.r.n. pain, 10      dispensed.   Follow up with Dr. Earlene Plater next week.  Follow up with Dr. Georgina Pillion  next week to monitor Coumadin and INR as he is  on antibiotic, and this may affect his INR; also to check blood pressure  as the patient reports that he had been on the higher dose of his  antihypertensives in the past. Please note that his blood pressure  currently is 112/60 on his current regimen.   DIET:  Should be low-salt.   ACTIVITY:  No heavy exertion.   CONDITION:  Stable.   CONSULTATIONS:  None.   PROCEDURES:  None.   PERTINENT LABORATORY DATA:  Urine culture is at this time pending.  INR  is 1.7.  Basic metabolic panel significant for a glucose of 148.  CBC  significant for white blood cell count of 10.6 with 83% neutrophils, 10%  lymphocytes, otherwise unremarkable.  Urinalysis showed trace blood,  negative protein, negative ketone, negative bilirubin, small leukocyte  esterase, negative nitrite, 0-2 white cells, 0-2 red cells,  rare  bacteria.   SPECIAL STUDIES IN RADIOLOGY:  Bladder scan showed 15 mL of urine.   HISTORY AND HOSPITAL COURSE:  Mr. Kullman is a very pleasant 70 year old  white male with multiple medical problems including a prostate biopsy a  few weeks ago after which time he required admission to the hospital for  febrile illness and systemic inflammatory response syndrome.  The cause  of his fever and other symptoms at that time were unclear, but he did  have a course of empiric antibiotics.  He presented last night with  severe suprapubic pain and dysuria.  He felt that he had to urinate, but  he was unable as there was a lot of pressure. He denied any fevers or  chills.  Apparently a Foley catheter was placed, and there was no urine  flow; but apparently a bladder scan was done which did not show any  significant residual.  The patient was placed on observation.  His Foley  catheter is at this time out, and he is urinating fine.  His pain has  improved.  He was started on Cipro to and IV pain medications. He feels  better, and his vital signs have  been stable.  He has some suprapubic  tenderness.  Otherwise, please see H&P for complete admission details.Marland Kitchen   He was afebrile and had a heart rate of 106, blood pressure 142/92,  otherwise unremarkable vital signs.  He is feeling better and has stable  vital signs.  I have discussed the case with Dr. Gaynelle Arabian, his  urologist, who recommends discharging home on Flomax, Cipro, and  outpatient followup.  I have also given him a prescription for peridium  and Dilaudid due to the significant pain he is experiencing   The patient's INR was slightly subtherapeutic during his  hospitalization. In my review of the records, it looks as if his  pulmonary embolus was diagnosed March 08, 2006, which makes this  almost 6 months of Coumadin.  have asked that he follow up with Dr.  Georgina Pillion for INR check, as he is on Cipro, and also to evaluate the length  of treatment of Coumadin, and I will defer to his primary care  physician.   The patient also had concerns about his blood pressure medication being  lower upon his last discharge than what he normally takes at home. At  this point, I have recommended that he not increase his dose as he has  remained  normotensive on his current regimen; but, again, this will be need to be  checked by his primary care physician and adjustments made if necessary.   His other medical problems remained stable.      Corinna L. Lendell Caprice, MD  Electronically Signed     CLS/MEDQ  D:  08/31/2006  T:  08/31/2006  Job:  [id number]   cc:   Windy Fast L. Earlene Plater, M.D.  Fax: [phone]   Oley Balm. Georgina Pillion, M.D.  Fax: [phone]

## 2010-06-07 NOTE — H&P (Signed)
NAME:  RONDA, KAZMI NO.:  [id number]   MEDICAL RECORD NO.:  [id number]          PATIENT TYPE:  INP   LOCATION:  1611                         FACILITY:  [facility]   PHYSICIAN:  Hollice Espy, M.D.DATE OF BIRTH:  12-01-1940   DATE OF ADMISSION:  08/30/2006  DATE OF DISCHARGE:                              HISTORY & PHYSICAL   PRIMARY CARE PHYSICIAN:  Oley Balm. Georgina Pillion, M.D.  Urologist, Dr. Earlene Plater.   CHIEF COMPLAINT:  Suprapubic pain.   HISTORY OF PRESENT ILLNESS:  The patient is a 70 year old, white male  who was recently discharged from the [facility] on August 25, 2006, after a 1 week stay for fever and systemic illness response for  unknown cause.  However, prior to his previous hospitalization, he had a  prostate biopsy of which ended up being negative.  He had an indwelling  Foley catheter during his hospitalization and was discharged home on  outpatient antibiotics.  He had been doing relatively well adhering to  his medicine and completed the antibiotic home medicine regimen, but  then on August 30, 2006, started having severe suprapubic pain,  nonradiating.  He had no hematuria, but also had a feeling of pressure  and felt as if he could not urinate.  He came into the emergency room  and had a Foley placed.  After placement of Foley, while it was unable  to elicit any urine despite being irrigated several times, he started  having severe pain and spasms without relief.  These symptoms persisted  and the ER attending felt likely the patient had prostatitis.  The  patient was given Dilaudid glycopyrrolate and Cipro.  Currently, he is  feeling a little bit better, although feels the pressure in his  suprapubic area is returning.  He denies any other complaints such as  headaches, vision changes, dysphagia, chest pain, palpitations,  shortness of breath, wheeze or cough.  He does complain of suprapubic  pain, but denies any other type of abdominal  pain.  He feels like he has  to void, but is unable to actually void.  Denies any hematuria.  He  denies any constipation or diarrhea.  No focal extremity numbness,  weakness or pain.  Review of systems otherwise negative.   PAST MEDICAL HISTORY:  1. PE on chronic anticoagulation.  2. Recent fever and systemic inflammatory response syndrome.  3. Obstructive sleep apnea.  4. History of CHF.  5. Hypertension.  6. Morbid obesity.  7. Colonic polyps, status post resection.   MEDICATIONS:  1. Valsartan 80 p.o. daily.  2. Coumadin 5 p.o. daily.  3. Atenolol 25 p.o. daily.  4. Lasix 160 p.o. b.i.d.  5. Omeprazole 20 p.o. daily.  6. KCl 60 mEq p.o. t.i.d.  7. Just finished a course of Flagyl yesterday.   ALLERGIES:  MORPHINE.   SOCIAL HISTORY:  No tobacco, alcohol or drug use.   FAMILY HISTORY:  Noncontributory.   PHYSICAL EXAMINATION:  VITAL SIGNS:  In the emergency room on admission,  temperature 97.9, heart rate 106, blood pressure 142/92, respirations  18, O2 saturations 95% on  room air.  GENERAL:  The patient appears to be in some mild distress secondary to  the pain of the Foley catheter being placed as well as a generalized  discomfort.  HEENT:  Normocephalic, atraumatic.  Mucous membranes are moist.  NECK:  No carotid bruits.  HEART:  Regular rate and rhythm, S1, S2.  LUNGS:  Clear to auscultation bilaterally.  ABDOMEN:  Soft, obese.  Minimal tenderness in the suprapubic area,  otherwise unremarkable.  EXTREMITIES:  No clubbing, cyanosis with trace pitting edema.   LABORATORY DATA AND X-RAY FINDINGS:  UA just notes a small leukocyte  esterase and trace hemoglobin with 0-2 wbc's and rbc's.  A BMET has been  ordered, but for some reason is still pending in the computer.  His INR  is 1.7.  White count 10.6, H&H 13.1/30, platelet count 264, MCV 85, 83%  neutrophils.   ASSESSMENT/PLAN:  1. Prostatitis.  Will put the patient on intravenous Cipro as well as      pain  control.  Will also check a bladder scan.  If he is shown to      have some significant urinary retention, will ask urology to see      for possible suprapubic placement or further recommendations.  2. Recent systemic inflammatory response and appears otherwise stable.      Put on intravenous Coumadin.  3. Hypertension.  Continue Valsartan and atenolol.  4. History of pulmonary embolus, slightly subtherapeutic on Coumadin.      Will continue.      Hollice Espy, M.D.  Electronically Signed     SKK/MEDQ  D:  08/31/2006  T:  08/31/2006  Job:  [id number]   cc:   Oley Balm. Georgina Pillion, M.D.  Fax: [phone]

## 2010-06-07 NOTE — H&P (Signed)
NAME:  Carlos Dawson, Carlos Dawson NO.:  [id number]   MEDICAL RECORD NO.:  [id number]          PATIENT TYPE:  INP   LOCATION:                               FACILITY:  [facility]   PHYSICIAN:  Hilda Lias, M.D.   DATE OF BIRTH:  Jul 04, 1940   DATE OF ADMISSION:  12/11/2007  DATE OF DISCHARGE:                              HISTORY & PHYSICAL   [name] is a gentleman who in the past underwent lumbar laminectomy  because of lumbar stenosis.  I have been following him in my office on  several occasions, and he underwent fusion of the L5-6.  Now, he  complains of back pain with radiation to both legs.  Since I saw him, he  has had a bypass surgery and now he has lost quite a bit of weight.  He  feels the pain in the lower back, although it is getting worse  especially with walking.  He had failed conservative treatment.  The  pain is getting worse up to the point that walking makes its quite  difficult.   PAST MEDICAL HISTORY:  1. Gastric bypass surgery.  2. Lumbar laminectomy.  3. Cervical fusion.   FAMILY HISTORY:  Positive for high blood pressure.   REVIEW OF SYSTEMS:  Positive for high blood pressure, swelling of the  feet, shallow breath, nasal congestion, lower back pain.   PHYSICAL EXAMINATION:  HEAD, EARS, NOSE, AND THROAT:  Normal.  NECK:  There is a scar anteriorly with some decreased flexibility.  LUNGS:  There is some mild rhonchi bilaterally.  CARDIOVASCULAR:  Normal.  ABDOMEN:  I can feel a mass.  EXTREMITIES:  Normal.  NEUROLOGIC:  He has weakness of dorsiflexion of both legs, straight leg  raising is positive 60 degrees.  He has lumbar scar.  Femoral stretch  maneuver is positive bilaterally.  The lumbar spine myelogram showed  that he has a severe stenosis at the level of at the level of L2-3 down  to L4-5.   IMPRESSION:  Lumbar stenosis.   RECOMMENDATIONS:  The patient is being admitted for surgery.  Procedure  will be bilateral 3, 4, 5 laminectomy to  decompress the thecal sac.  We  are going to use the above for postop arthrodesis.  The patient knows  about the risks such as infection, CSF leak, worsening pain, paralysis,  need of further surgery.           ______________________________  Hilda Lias, M.D.     EB/MEDQ  D:  12/11/2007  T:  12/12/2007  Job:  [id number]

## 2010-06-07 NOTE — Op Note (Signed)
NAME:  Carlos Dawson, Carlos Dawson              ACCOUNT NO.:  [id number]   MEDICAL RECORD NO.:  [id number]          PATIENT TYPE:  AMB   LOCATION:  SDS                          FACILITY:  [facility]   PHYSICIAN:  Juleen China IV, MDDATE OF BIRTH:  May 08, 1940   DATE OF PROCEDURE:  08/15/2007  DATE OF DISCHARGE:                               OPERATIVE REPORT   PREOPERATIVE DIAGNOSIS:  Inferior vena cava filter.   PROCEDURES PERFORMED:  1. Ultrasound access, right internal jugular vein.  2. Catheter in inferior vena cava.  3. Inferior vena cavogram.  4. Removal of inferior vena cava filter.   INDICATIONS:  This is a 70 year old gentleman with history of pulmonary  emboli who had a filter placed prior to morbid obesity surgery.  He  comes back in for filter removal.  He has had an ultrasound in the  office that showed no evidence of DVT.  Risks and benefits were  discussed, informed consent was signed.   PROCEDURE:  The patient was identified in the holding area, taken to  room 8, and he was placed supine on the table.  Right neck was prepped  and draped in a standard sterile fashion.  Using ultrasound, the right  internal jugular vein was evaluated, found to be easily compressible and  widely patent.  Lidocaine 1% was used for local anesthesia.  Using  ultrasound, the right internal jugular vein was accessed with a  micropuncture needle.  A mandrel wire was then advanced into the right  atrium under fluoroscopic visualization.  Next, a micropuncture sheath  was placed.  Through the sheath, a Rosen wire was advanced into the  inferior vena cava.  Over the wire, Omni flush catheter was placed to  the IVC bifurcation.  An inferior vena cavogram was obtained.  This  showed no evidence of IVC thrombus.  Next, a 7-French sheath was placed.  A 15-mm snare was then advanced through the sheath and used to grasp the  hook of the G2X filter.  The sheath was then advanced down over the top  of the filter  and the filter was then removed.  It was inspected on the  back table.  All the filter was intact.  Sheath was removed.  Pressure  was held for hemostasis.  There were no complications.   IMPRESSION:  Successful removal of inferior vena cava filter.           ______________________________  V. Charlena Cross, MD  Electronically Signed     VWB/MEDQ  D:  08/15/2007  T:  08/16/2007  Job:  [id number]

## 2010-06-07 NOTE — Assessment & Plan Note (Signed)
OFFICE VISIT   Carlos Dawson, Carlos Dawson  DOB:  1940/06/17                                       07/01/2007  EAVWU#:[id number]   REASON FOR VISIT:  IVC filter.   HISTORY:  This is a 70 year old gentleman with a history of DVT as well  as pulmonary embolism who was scheduled to undergo morbid obesity  surgery on May 4th, 2009.  Due to his history, it was recommended that  an IVC filter be placed prior to his operation.  On 05/24/2007, the  patient was taken to the angiogram suite and a Bard G2X filter was  placed, at the level of the renal vein confluence from the left common  femoral vein approach.  His IVC measures 15 mm.  This was done without  incident.  The patient was discharged home.  He subsequently had his  morbid obesity surgery and comes back in today for a possible filter  removal.  He still continues to be on his Coumadin, although his recent  lab checks revealed his INR was subtherapeutic at 1.4.  He says he lost  approximately 40 pounds.   PHYSICAL EXAMINATION:  Blood pressure 122/78, pulse 52.  General:  Well-  appearing in no acute distress.  His abdomen is soft and nontender.  His  incisions are healing nicely.   DIAGNOSTIC STUDIES:  The patient had duplex evaluation today that shows  no evidence of DVT bilaterally.   ASSESSMENT/PLAN:  History of deep vein thrombosis/pulmonary embolism  with an IVC filter.   PLAN:  At this point it is suspected that the patient's DVT developed in  the setting of a prolonged immobility following operation.  To my  knowledge he did not have a hypercoagulable state.  I feel it would be  safe to proceed with removing his filter.  This will be scheduled to be  done June 16th or 18th.  He will need to be off his Coumadin for 5 days  prior.  The details of the procedure were discussed with the patient and  he understands that there is a possibility that we may not be able to  retrieve it.  There is a small  possibility of bleeding.  Again, this  will be scheduled for June 16th or June 18th.   Jorge Ny, MD  Electronically Signed   VWB/MEDQ  D:  07/01/2007  T:  07/02/2007  Job:  [id number]   cc:   Colleen Can. Deborah Chalk, M.D.  [place] Daphine Deutscher, MD

## 2010-06-07 NOTE — Discharge Summary (Signed)
NAME:  Carlos Dawson, Carlos Dawson NO.:  [id number]   MEDICAL RECORD NO.:  [id number]          PATIENT TYPE:  INP   LOCATION:  2013                         FACILITY:  [facility]   PHYSICIAN:  Barnetta Chapel, MDDATE OF BIRTH:  [birth year]   DATE OF ADMISSION:  08/17/2006  DATE OF DISCHARGE:  08/25/2006                               DISCHARGE SUMMARY   PRIMARY CARE Aveah Castell:  Dr. Lajean Manes   DISCHARGE DIAGNOSES:  1. Pyrexia of unknown origin, resolved.  2. Systemic inflammatory response syndrome.  3. Diarrhea, resolved.  4. Hematochezia, resolved.  5. Colonic polyps status post resection.  6. Obstructive sleep apnea.  7. History of congestive heart failure.  8. Hypertension, controlled.  9. History of pulmonary embolism.  10.Morbid obesity.   DISCHARGE MEDICATIONS:  1. Flagyl 500 mg p.o. q. 8 hourly x5 days.  2. Valsartan 80 mg p.o. once daily.  3. Coumadin 5 mg p.o. once daily.  4. Atenolol 25 mg p.o. once daily.  5. Lasix 160 mg p.o. twice daily.  6. Omeprazole 20 mg p.o. once daily.  7. KCl 60 mEq p.o. t.i.d.  8. Nefazodone 100 mg p.o. t.i.d.   CONSULTATIONS:  GI consult.  The patient was seen by Dr. Evette Cristal for blood  in the stool.  The patient eventually had a colonoscopy which revealed  redness from the rectum and 3 polyps.  The polyps were resected and  biopsied.  The plan is for the primary care Dabney Dever to please follow up  biopsy results.   BRIEF HISTORY AND HOSPITAL COURSE:  Please refer to the history and  physical done on August 17, 2006.  The patient is a 70 year old male with  multiple medical problems.  The patient presented to the ER with a  temperature of 103 after having had the prostate biopsy.  He developed  sudden onset of shortness of breath while in the ER.   The patient was admitted to ICU for further evaluation and management.  CT of the chest was negative for PE.  The patient was worked up for  possible sepsis, but all the cultures came  back negative.  He was  initially treated with IV vancomycin and Zosyn.  During his stay in the  hospital, he developed diarrhea.  He was worked up for C. difficile by  the culture.  The C. difficile test came back negative.  The patient was  managed with Cipro and Flagyl.  The fever resolved.  The diarrhea  resolved.  However, he developed blood in his stool.  Considering a  prior history of PE a few months ago, blood in the stool, a GI consult  was called.  He had a colonoscopy (please see above).  The patient has  remained stable.  The blood in his stool has resolved.  He is no longer  having fever.  His shortness of breath has also resolved.  The patient  has done well and will be discharged back home today to the care of the  primary care Tallia Moehring.   DISCHARGE PLANS:  1. Discharge the patient home today.  2. Follow up  with the primary care Ashle Stief, Dr. Lajean Manes in a      week.  3. Check INR in 2 days in Dr. Ronnald Nian office (his cardiologist).  4. Follow up with the cardiologist also.  5. Cardiac diet.  6. Activity as tolerated.      Barnetta Chapel, MD  Electronically Signed     SIO/MEDQ  D:  08/25/2006  T:  08/25/2006  Job:  [id number]   cc:   Oley Balm. Georgina Pillion, M.D.  Colleen Can. Deborah Chalk, M.D.

## 2010-06-07 NOTE — Op Note (Signed)
NAME:  Carlos Dawson, Carlos Dawson NO.:  [id number]   MEDICAL RECORD NO.:  [id number]          PATIENT TYPE:  INP   LOCATION:  2013                         FACILITY:  [facility]   PHYSICIAN:  Graylin Shiver, M.D.   DATE OF BIRTH:  1940/02/16   DATE OF PROCEDURE:  DATE OF DISCHARGE:                               OPERATIVE REPORT   COLONOSCOPY WITH POLYPECTOMY AND BIOPSY:   INDICATION:  Rectal bleeding.   Informed consent was obtained after explanation of the risks of  bleeding, infection and perforation.   PREMEDICATION:  See medication sheet on chart.   PROCEDURE:  With the patient in the left lateral decubitus position,a  rectal exam was performed.  No masses were felt.  The Pentax colonoscope  was inserted into the rectum and advanced around the colon to the cecum.  Cecal landmarks were identified.  In the cecum there were three polyps.  One was 3 mm in size,biopsied off.  The other was 4 mm in size, biopsied  off, and one was 1 cm in size, which was sessile and snared and removed  by snare cautery technique.  The ascending colon looked normal.  The  transverse colon looked normal.  The descending colon and sigmoid looked  normal.  In the distal rectum there was redness and ecchymosis focally,  where I suspect the patient had his prostate biopsy done last week.  There was no evidence of active bleeding at this time.  He tolerated the  procedure well without complications.   IMPRESSION:  1. Three colon polyps in the cecum, which were removed.  2. Redness and ecchymosis focally in the rectum from prior prostate      biopsy.  There is no evidence currently of active bleeding.           ______________________________  Graylin Shiver, M.D.     SFG/MEDQ  D:  08/24/2006  T:  08/25/2006  Job:  [id number]   cc:   Corinna L. Lendell Caprice, MD

## 2010-06-07 NOTE — Consult Note (Signed)
NEW PATIENT CONSULTATION   VEER, ELAMIN  DOB:  [birth year]                                        Jun 17, 2009  CHART #:  [id number]   PRIMARY CARE PHYSICIAN:  Candyce Churn, MD   Cardiology, Colleen Can.Deborah Chalk M.D.   REASON FOR CONSULTATION:  Ascending thoracic aneurysm.   HISTORY OF PRESENT ILLNESS:  The patient is a 70 year old male who had  previous history of a dilated ascending aorta initially found on a CT  scan in 2008, most immediate concern was that the patient was seen in  the Cardiology office when he awoke one morning noted that he felt bad  but no more specific than that, somewhat flushed and checked his blood  pressure and found that it was 195/110.  He was seen by Cardiology and  had his blood pressure medications adjusted and now notes that he is  under better blood pressure control.  At the same time, because of his  previous history of a dilated ascending aorta, a repeat CT scan was  performed and he is referred for cardiac surgery to review his CT scan  and make recommendations as far as his known dilated ascending aorta.   Initially because of episode of chest pain and shortness of breath, he  had a CT scan of the chest done in 2008.  At that time, he was diagnosed  with pulmonary embolus and was maintained on Coumadin until 2009.  Previously he had a cardiac catheterization in January 2006.  His most  recent stress test was in 2009 and showed no ischemia, because a repeat  CT scan was performed in 2009, which showed cardiac enlargement with an  ascending aorta 4.7 to 4.8 cm in the mid ascending aorta.  Cardiac risk  factors include hypertension and hyperlipidemia.  He denies diabetes.  He is a remote smoker, quit in 1983.  He has had no previous stroke.  No  claudication.  No renal insufficiency.   PAST MEDICAL HISTORY:  Significant for history of pulmonary embolus, off  Coumadin since July 2009, history of colon polyps.   PREVIOUS SURGICAL HISTORY:  1. Gastric bypass 2009 for obesity.  2. Inferior vena caval filter placed.  This was placed prior to his      gastric bypass surgery and several months after his gastric bypass      surgery was removed.  By his history, he no longer has a caval      filter in place.  3. History of appendectomy.  4. History of hemorrhoidectomy.  5. History of vasectomy.  6. History of operation on his foot 2008, was unable to ambulate for 6      months probably the inciting event causing the pulmonary embolus.  7. History of cholecystectomy.  8. Sleep apnea study.  9. Bilateral rotator cuff repairs.  10.Three previous back surgeries.  11.One previous neck surgery.   SOCIAL HISTORY:  The patient is married, retired as an Careers adviser.   FAMILY HISTORY:  The patient's father died at age 65 of emphysema.  The  patient's mother is 20 years old and alive.  He had one sister who died  suddenly at age 30 of what the family was told a myocardial infarction,  though there is no documentation of this and prior to her death, there  was no history of coronary artery disease.  He had one brother died at  age 44 of bladder cancer.  His son had a spinal stenosis, ASD, repaired  at age 62 and has a known bicuspid aortic valve, size of his aorta is  unknown.  In 1965, he had one uncle who had sudden death in his 45s.  He  has one sister who is 29 and alive without any known cardiac disease.   CURRENT MEDICATIONS:  Potassium 6 mEq twice a day, Lasix 80 mg b.i.d.,  atenolol 25 mg a day, Flomax 0.4 mg a day, tramadol p.r.n., Norvasc 5 mg  twice a day, valsartan 80 mg twice a day.   ALLERGIES:  Morphine causes itching, paper tape causes itching.   REVIEW OF SYSTEMS:  CARDIAC:  Negative for chest pain, resting shortness  of breath.  He does have exertional shortness of breath.  Denies  syncope.  Denies rare palpitations.  Does have lower extremity edema  especially when he skips doses of  Lasix.  Denies presyncope, orthopnea.  GENERAL:  Currently has no constitutional symptoms.  Denies fever,  chills.  He does note several weeks ago with feeling bad which  precipitated his visit to Cardiology.  Denies amaurosis or TIA's.  Has  noticed definite visual difficulties.  He does have arthritis involves  her joints.  Does note some slow urinary flow.  No nocturia.  No  hematuria.  Notes that his PSA is elevated to 16 currently.  Being  evaluated by Urology for this.  Denies polyuria, polydipsia.   PHYSICAL EXAMINATION:  Vital Signs:  Today blood pressures 129/82, pulse  64, respiratory rate is 18, O2 sats 92%.  He is 5 feet 7 inches tall,  weighs 260 pounds.    DICTATION ENDS HERE.   Sheliah Plane, MD  Electronically Signed   EG/MEDQ  D:  06/17/2009  T:  06/18/2009  Job:  [id number]   cc:   Colleen Can. Deborah Chalk, M.D.  Candyce Churn, M.D.

## 2010-06-07 NOTE — Op Note (Signed)
NAME:  Carlos Dawson, Carlos Dawson              ACCOUNT NO.:  [id number]   MEDICAL RECORD NO.:  [id number]          PATIENT TYPE:  AMB   LOCATION:  SDS                          FACILITY:  [facility]   PHYSICIAN:  Juleen China IV, MDDATE OF BIRTH:  [birth year]   DATE OF PROCEDURE:  05/24/2007  DATE OF DISCHARGE:                               OPERATIVE REPORT   PREOPERATIVE DIAGNOSES:  1. Deep vein thrombosis.  2. Pulmonary embolism.  3. Preoperative morbid obesity surgery.   PROCEDURES PERFORMED:  1. Ultrasound access of the left common femoral vein.  2. Catheter in inferior vena cava.  3. Inferior venacavogram.  4. Inferior vena cava filter placement.   INDICATIONS:  This is a 70 year old gentleman who is scheduled to  undergo morbid obesity surgery on Monday, May 27, 2007.  He has a history  of a DVT, as well as pulmonary embolism for which he is currently  anticoagulated for.  He comes in for a preoperative placement of the IVC  filter.  Risks and benefits were discussed and informed consent was  signed.   PROCEDURE:  The patient was identified in the holding area and taken to  room #8.  He was placed supine on the table.  Bilateral groins were  prepped and draped in standard sterile fashion.  Using ultrasound, the  left common femoral vein was evaluated, found to be easily compressible  and widely patent.  Lidocaine 1% was used for local anesthesia.  Using  an 18-gauge needle, the left common femoral vein was accessed under  ultrasound guidance.  An 0.35 wire was then advanced in the retrograde  fashion into the inferior vena cava.  Next day, the filter introducing  sheath was advanced to the caval bifurcation.  At this point in time,  the inferior venacavogram was performed.  There were no aberrant renal  veins.  There was no IVC thrombus.  The diameter of the IVC measured 15  mm.  Next, a Bard G2X removable filter was advanced through the sheath.  It was successfully deployed below the  renal vein confluence.  A  fluoroscopic spot image was then performed which revealed a tubular  filter at the bottom of the L2 vertebral body.  At this point, the  sheath was removed and manual compression was held for hemostasis.  The  patient tolerated the procedure well.  There are no complications.   IMPRESSION:  1. Successful placement of a Bard G2X removable inferior vena cava      filter.  Should removal of this filter be necessary, it should be      done within the next 3 months.  2. Normal inferior venacavogram.          ______________________________  V. Charlena Cross, MD  Electronically Signed    VWB/MEDQ  D:  05/25/2007  T:  05/26/2007  Job:  [id number]

## 2010-06-07 NOTE — Discharge Summary (Signed)
NAME:  Carlos Dawson, PURSIFULL NO.:  [id number]   MEDICAL RECORD NO.:  [id number]          PATIENT TYPE:  INP   LOCATION:  1536                         FACILITY:  [facility]   PHYSICIAN:  Wilmon Arms. Corliss Skains, M.D. DATE OF BIRTH:  24-Jun-1940   DATE OF ADMISSION:  07/14/2007  DATE OF DISCHARGE:  07/16/2007                               DISCHARGE SUMMARY   ADMISSION DIAGNOSES:  1. Abdominal pain, distention.  2. Diarrhea.   DISCHARGE DIAGNOSES:  1. Chronic diarrhea, resolved.  2. Abdominal distention.   BRIEF HISTORY:  The patient is a 70 year old male who is recently postop  from a lap band procedure.  He has had diarrhea for some time, even  before surgery.  He presented on July 14, 2007, with abdominal  distention, pain and nausea.  He continues to have diarrhea.  Plain  films showed dilated small bowel and colon with air-fluid levels.  It  was admitted to the hospital for symptomatic control.  CT scan showed no  clear sign of obstruction, but did show some dilated loops of distal  small bowel.  The patient's pain resolved over the first couple days.  Clostridium difficile studies were negative.  Repeat plain films showed  that contrast had entered the colon so there is no bowel obstruction.  Today the patient feels much better.  He has been tolerating a regular  diet.  He continues to have diarrhea.   DISCHARGE INSTRUCTIONS:  The patient is to follow up with Dr. Luretha Murphy, his bariatric surgeon, within the next couple of weeks.  We will  refer him to Dr. Danise Edge, GI, for evaluation of his chronic  diarrhea.  Resume all this preop medications.  No new medications.      Wilmon Arms. Tsuei, M.D.  Electronically Signed     MKT/MEDQ  D:  07/16/2007  T:  07/16/2007  Job:  [id number]

## 2010-06-07 NOTE — Discharge Summary (Signed)
NAME:  BALDO, HUFNAGLE NO.:  [id number]   MEDICAL RECORD NO.:  [id number]          PATIENT TYPE:  INP   LOCATION:  3041                         FACILITY:  [facility]   PHYSICIAN:  Hilda Lias, M.D.   DATE OF BIRTH:  May 13, 1940   DATE OF ADMISSION:  12/11/2007  DATE OF DISCHARGE:  12/15/2007                               DISCHARGE SUMMARY   ADMISSION DIAGNOSIS:  Lumbar stenosis, L2-L3, L4-L5.   FINAL DIAGNOSIS:  Lumbar stenosis, L2-L3, L4-L5.   CLINICAL HISTORY:  Mr. Sheek is an obese gentleman who underwent  gastric bypass.  In the past, he has lumbar decompression.  Now, he had  been complaining of back pain radiation to both legs.  X-ray showed  severe stenosis at the level of L2-L3 and L4-L5.  The patient want to  proceed with surgery.   LABORATORY DATA:  Normal.   COURSE IN THE HOSPITAL:  The patient was taken to surgery and partial L2  and partial L5 plus total L3-L4 laminectomy with arthrodesis was done.  Today, he is ambulating.  He has minimal discomfort and he feel that he  is ready to go home.   CONDITION ON DISCHARGE:  Improving.   MEDICATIONS:  Percocet and diazepam.   DIET:  He is going to continue with a weight loss diet.   ACTIVITY:  Not to drive for at least 2 weeks.   FOLLOWUP:  To be seen by me in 3 weeks to have the staples out.           ______________________________  Hilda Lias, M.D.     EB/MEDQ  D:  12/15/2007  T:  12/15/2007  Job:  [id number]

## 2010-06-07 NOTE — Op Note (Signed)
NAME:  Carlos Dawson, Carlos Dawson NO.:  [id number]   MEDICAL RECORD NO.:  [id number]          PATIENT TYPE:  AMB   LOCATION:  DAY                          FACILITY:  [facility]   PHYSICIAN:  Thornton Park. Daphine Deutscher, MD  DATE OF BIRTH:  07-Nov-1940   DATE OF PROCEDURE:  05/27/2007  DATE OF DISCHARGE:                               OPERATIVE REPORT   PREOPERATIVE DIAGNOSES:  Morbid obesity, body mass index 46.   POSTOPERATIVE DIAGNOSES:  Morbid obesity, body mass index 46.   PROCEDURE:  Lap band APL.   SURGEON:  Thornton Park. Daphine Deutscher, MD   ASSISTANT:  Jaclynn Guarneri, MD_________   ANESTHESIA:  General endotracheal.   DESCRIPTION OF PROCEDURE:  [name] was taken to room 11 on May 27, 2007 at [facility], given general anesthesia.  The abdomen was prepped  with Techni-Care and draped sterilely.  Access to the abdomen was gained  through the left upper quadrant using OptiVu technique without  difficulty.  Abdomen was insufflated and standard trocars were placed  with the exception of an extra five on the left side.  With the trocars  in and the liver retractor in place, the patient was placed up in head  up.  Because of the amount of fat in his foregut and the gastrohepatic  window was quite impregnated with fat, we decided put an APL band in  him.  The APL tubing was passed, blown up to 15 mL and pulled back and  there was restriction indicating that it was not able to slide above the  hiatus.  I felt like we would not need to do a hiatal hernia repair.  Upper GI showed maybe a small sliding hiatal hernia with minimal reflux.  I went ahead and passed the band passer from the right crus stripe and  it came through easily.  The APL band was then threaded on the band  passer and brought around, snapped in place.  Anterior plication was  performed with three sutures, the stomach to the proximal pouch with tie  knots using three sutures of 2-0 Surgitek.   The band was then brought out  through the lower port on the right which  was expanded about a two inch in length incision and because the depth  of the fat was so still I was able to suture the fascia and to inject  that with Marcaine  and then irrigate and close with 4-0 Vicryl with Benzoin and Steri-  Strips.  Remaining portion of the incisions were also injected with  Marcaine and closed with 4-0 Vicryl.  The patient tolerated the  procedure well, was taken to recovery room in satisfactory condition.      Thornton Park Daphine Deutscher, MD  Electronically Signed     MBM/MEDQ  D:  05/27/2007  T:  05/27/2007  Job:  [id number]   cc:   Oley Balm. Georgina Pillion, M.D.  Fax: [phone]   Colleen Can. Deborah Chalk, M.D.  Fax: [phone]   V. Charlena Cross, MD  [address]  [place] [address]

## 2010-06-07 NOTE — Procedures (Signed)
DUPLEX DEEP VENOUS EXAM - LOWER EXTREMITY   INDICATION:  History of DVT and PE.   HISTORY:  Edema:  No.  Trauma/Surgery:  Lap band surgery on 05/27/07.  IVC placement.  Pain:  No.  PE:  Patient had PE after Achilles tendon surgery approximately one year  ago.  Previous DVT:  History of DVT, status post surgery one year ago.  Anticoagulants:  Coumadin.  Other:   DUPLEX EXAM:                CFV   SFV   PopV  PTV    GSV                R  L  R  L  R  L  R   L  R  L  Thrombosis    o  o  o  o  o  o  o   o  o  o  Spontaneous   +  +  +  +  +  +  +   +  +  +  Phasic        +  +  +  +  +  +  +   +  +  +  Augmentation  +  +  +  +  +  +  +   +  +  +  Compressible  +  +  +  +  +  +  +   +  +  +  Competent   Legend:  + - yes  o - no  p - partial  D - decreased   IMPRESSION:  1. No evidence of deep venous thrombosis or superficial venous      thrombosis bilaterally.  2. Bifid system of SFA noted bilaterally, which shows no evidence of      deep venous thrombosis.    _____________________________  V. Charlena Cross, MD   PB/MEDQ  D:  07/01/2007  T:  07/01/2007  Job:  [id number]

## 2010-06-07 NOTE — Op Note (Signed)
NAME:  ELLWYN, ERGLE NO.:  [id number]   MEDICAL RECORD NO.:  [id number]          PATIENT TYPE:  INP   LOCATION:  3041                         FACILITY:  [facility]   PHYSICIAN:  Hilda Lias, M.D.   DATE OF BIRTH:  Jun 03, 1940   DATE OF PROCEDURE:  12/11/2007  DATE OF DISCHARGE:                               OPERATIVE REPORT   PREOPERATIVE DIAGNOSIS:  Lumbar stenosis with neurogenic claudication L2-  3, L4-5 borderline L3-4.  Status post previous laminectomy.   POSTOPERATIVE DIAGNOSIS:  Lumbar stenosis with neurogenic claudication  L2-3, L4-5 borderline L3-4.  Status post previous laminectomy.   PROCEDURE:  Bilateral L3-L4 laminectomy, partial L2-L5 laminectomy,  posterolateral arthrodesis, foraminotomy.   SURGEON:  Hilda Lias, MD   ASSISTANT:  Hewitt Shorts, MD   CLINICAL HISTORY:  Mr. Kersh is a gentleman who is 70 years old,  morbidly obese who had gastric bypass.  Lately, he had been complaining  of back pain worse to both legs.  He had failed with every single  conservative treatment.  X-rays showed stenosis at the level of L4-5 and  at the level of L2-3, which he has never had surgery at that level.  He  was borderline between L3-4.  Surgery was advised.  The risks were  explained to him with the history and physical including possibility of  no improvement whatsoever, CSF leak, infection, and all the risk  associated with his obesity.   PROCEDURE:  The patient was taken to the OR and he was positioned in  prone manner.  The back was cleaned with DuraPrep.  Midline incision  from L2-3 down to the second area was done.  Incision was carried out  through the thick adipose tissue, fat tissue down to the spinous process  of L3.  We retracted the muscle at the level of L2-3 and then we went  down the midline.  We started at the level of L2-3 where we removed the  spinous process 3 and partial L2.  With the drill we did. Bilateral  laminectomy at  the level of 3 and partial at the level of 2.  The  ligament was quite thick.  Decompression of the thecal sac was done.  Then with working our way down into the L5 space.  That area has quite a  bit of adhesion where the most difficult part of the procedure was doing  lysis of adhesions to mobilize the thecal sac.  At the end, we had good  decompression of the thecal sac at the level of L3-4 and partial at the  level of L5.  Nerve probe on the L5 showed that there was space below.  Foraminotomy was achieved bilaterally.  From then on, we went laterally  and we drilled the transverse process and lateral aspect of the facet of  L2-3, L3-4, L4-5, and autograft was used for arthrodesis.  The area was  irrigated.  Hemostasis was accomplished.  Gelfoam was attached to the  dura matter to prevent any slippage of the bone into the canal.  Then,  the wound was closed with different  layers using a 2-0 Vicryl and staple  to the skin.          ______________________________  Hilda Lias, M.D.    EB/MEDQ  D:  12/11/2007  T:  12/12/2007  Job:  [id number]

## 2010-06-07 NOTE — Assessment & Plan Note (Signed)
OFFICE VISIT   PRESSLEY, TADESSE  DOB:  [birth year]                                        December 02, 2009  CHART #:  [id number]   The patient returns to the office in follow up for his ascending  thoracic aneurysm.  The patient is a 70 year old male with known dilated  ascending aorta found initially on a CT scan in 2008.  Subsequent CT  scans in 2009 and a CT scan in May 2011 shows ascending aorta dilated  from 4.7 to 4.8.  He again had a CT scan done in September 2011 that was  unchanged from 6 months previously.  He comes in today for a followup  appointment.  Since he is last seen, his blood pressure appears better  controlled.  He has noted some blood pressure medicine changes.  He  still has shortness of breath when he climbs stairs.  He has had a  recent gastric bypass in 2009 but does not appear to have lost much  weight.  His past medical history is noted above.  He has history of  pulmonary embolus, off Coumadin since 2009, history of colon polyps,  history of appendectomy, history of hypertension, sleep apnea, multiple  back surgeries.  Family history is significant for his father dying of  emphysema at 52, sister died suddenly at age 49 of myocardial  infarction, the details of this are unknown, she had no history of  dilated aorta.  He has a son with ASD repaired at age 61 and known  bicuspid aortic valve.   On exam, today his blood pressure is much better than his previous visit  122/75, pulse 52, respiratory rate 16, O2 sats 94%.  His lungs are  clear.  Cardiac exam reveals a regular rate and rhythm.  I do not  appreciate any murmur of aortic insufficiency.  He remains with  significant obesity.  He has no calf tenderness or swelling.   His medications include potassium 60 mEq b.i.d., Lasix 80 mg b.i.d.,  atenolol 25 a day, Flomax, tramadol p.r.n., Norvasc 5 mg b.i.d.,  finasteride 5 mg a day, and Valturna 150/160 once a day.   A CT  scan from September 2011 is reviewed and appears unchanged from 6  months previously.   At this point with the aorta slightly less than 5 cm in a patient with  history of pulmonary emboli and morbid obesity, I would be reluctant to  recommend elective resection of his ascending aorta.  At this point, I  have discussed the risks of surgery and the risks of dissection with  him.  The usual criteria would be elective replacement at 5.5 cm.  We  will plan to see the patient back in 9-10 months with a followup CT scan  which will be about a year after his last CT.   Sheliah Plane, MD  Electronically Signed   EG/MEDQ  D:  12/02/2009  T:  12/03/2009  Job:  [id number]   cc:   Candyce Churn, M.D.  Colleen Can. Deborah Chalk, M.D.

## 2010-06-07 NOTE — Consult Note (Signed)
NEW PATIENT CONSULTATION   Carlos Dawson, Carlos Dawson  DOB:  [birth year]                                        Jun 17, 2009  CHART #:  [id number]   ADDENDUM   PHYSICAL EXAMINATION:  General:  The patient's BMI is 41, obesity scale  3.  Patient is awake, alert, and neurologically intact.  HEENT:  Pupils  are equal, round, and reactive to light.  Neck:  Without carotid bruits.  He has no jugular venous distention.  He has a 2/6 early systolic  murmur.  Abdomen:  Obese abdomen with small port incisions from his  previous surgery and is unable to palpate any abdominal aneurysm.  Extremities:  He has 2+ pedal edema bilaterally, +1 DP and PT pulses  bilaterally.   DIAGNOSTIC TESTS:  The patient's CT scans are reviewed.  He has a CT  scan in 2008, a CT scan in 2009, and a CT scan in 2011.  The most recent  scan May 26, 2009 shows by my measurement approximately 4.7-4.8 cm  ascending aortic aneurysm.  This maybe very slightly increased, less  than 2 mm enlargement over 2 years.  I have not reviewed the full  report, but he has an echo in August 2009 that noted to have mild aortic  root enlargement.  No mention of aortic insufficiency or aortic  stenosis.   IMPRESSION:  The patient with significant hypertension, morbid obesity,  and aortic root enlargement without other manifestations of Marfan's.  I  have discussed with the patient in detail the need for good blood  pressure control and to avoid heavy physical exertion especially  involving Valsalva maneuver.  We discussed the risks and options of  ascending aorta replacement with or without aortic valve replacement.  At this point, with the patient's other comorbidities and lack of  definite connective tissue disorder, I have recommended to the patient  that usual increased risk of acute problem is after the aorta reaches  5.5.  In this particular patient,  I think that is an appropriate thought.  We will plan on continued  tight  blood pressure control and we will obtain a repeat CT scan in 8 months.   Sheliah Plane, MD    EG/MEDQ  D:  06/17/2009  T:  06/18/2009  Job:  [id number]   cc:   Colleen Can. Deborah Chalk, M.D.  Candyce Churn, M.D.  Hilda Lias, M.D.  Ronald L. Earlene Plater, M.D.

## 2010-06-08 ENCOUNTER — Inpatient Hospital Stay (HOSPITAL_COMMUNITY)
Admission: EM | Admit: 2010-06-08 | Discharge: 2010-06-10 | DRG: 552 | Disposition: A | Discharge status: Home / Self Care | Payer: 59 | Attending: Internal Medicine | Admitting: Internal Medicine

## 2010-06-08 ENCOUNTER — Emergency Department (HOSPITAL_COMMUNITY): Payer: 59

## 2010-06-08 DIAGNOSIS — Z7901 Long term (current) use of anticoagulants: Secondary | ICD-10-CM

## 2010-06-08 DIAGNOSIS — M51379 Other intervertebral disc degeneration, lumbosacral region without mention of lumbar back pain or lower extremity pain: Principal | ICD-10-CM | POA: Diagnosis present

## 2010-06-08 DIAGNOSIS — Z86718 Personal history of other venous thrombosis and embolism: Secondary | ICD-10-CM

## 2010-06-08 DIAGNOSIS — I4891 Unspecified atrial fibrillation: Secondary | ICD-10-CM | POA: Diagnosis present

## 2010-06-08 DIAGNOSIS — G4733 Obstructive sleep apnea (adult) (pediatric): Secondary | ICD-10-CM | POA: Diagnosis present

## 2010-06-08 DIAGNOSIS — I4892 Unspecified atrial flutter: Secondary | ICD-10-CM | POA: Diagnosis present

## 2010-06-08 DIAGNOSIS — K219 Gastro-esophageal reflux disease without esophagitis: Secondary | ICD-10-CM | POA: Diagnosis present

## 2010-06-08 DIAGNOSIS — Z981 Arthrodesis status: Secondary | ICD-10-CM

## 2010-06-08 DIAGNOSIS — M5137 Other intervertebral disc degeneration, lumbosacral region: Principal | ICD-10-CM | POA: Diagnosis present

## 2010-06-08 DIAGNOSIS — I10 Essential (primary) hypertension: Secondary | ICD-10-CM | POA: Diagnosis present

## 2010-06-08 DIAGNOSIS — I509 Heart failure, unspecified: Secondary | ICD-10-CM | POA: Diagnosis present

## 2010-06-08 LAB — DIFFERENTIAL
Basophils Absolute: 0 10*3/uL (ref 0.0–0.1)
Basophils Relative: 1 % (ref 0–1)
Eosinophils Absolute: 0.2 10*3/uL (ref 0.0–0.7)
Eosinophils Relative: 3 % (ref 0–5)
Lymphocytes Relative: 27 % (ref 12–46)
Lymphs Abs: 1.6 10*3/uL (ref 0.7–4.0)
Monocytes Absolute: 0.4 10*3/uL (ref 0.1–1.0)
Monocytes Relative: 7 % (ref 3–12)
Neutro Abs: 3.7 10*3/uL (ref 1.7–7.7)
Neutrophils Relative %: 62 % (ref 43–77)

## 2010-06-08 LAB — CBC
HCT: 40.2 % (ref 39.0–52.0)
Hemoglobin: 13.6 g/dL (ref 13.0–17.0)
MCH: 29.2 pg (ref 26.0–34.0)
MCHC: 33.8 g/dL (ref 30.0–36.0)
MCV: 86.3 fL (ref 78.0–100.0)
Platelets: 152 10*3/uL (ref 150–400)
RBC: 4.66 MIL/uL (ref 4.22–5.81)
RDW: 13.4 % (ref 11.5–15.5)
WBC: 5.9 10*3/uL (ref 4.0–10.5)

## 2010-06-08 LAB — BASIC METABOLIC PANEL
BUN: 13 mg/dL (ref 6–23)
CO2: 29 mEq/L (ref 19–32)
Calcium: 9.1 mg/dL (ref 8.4–10.5)
Chloride: 102 mEq/L (ref 96–112)
Creatinine, Ser: 0.98 mg/dL (ref 0.4–1.5)
GFR calc Af Amer: >60 mL/min (ref >60)
GFR calc non Af Amer: >60 mL/min (ref >60)
Glucose, Bld: 108 mg/dL — ABNORMAL HIGH (ref 70–99)
Potassium: 3.6 mEq/L (ref 3.5–5.1)
Sodium: 140 mEq/L (ref 135–145)

## 2010-06-08 MED ORDER — GADOBENATE DIMEGLUMINE 529 MG/ML IV SOLN
20.0000 mL | Freq: Once | INTRAVENOUS | Status: AC | PRN
  Administered 2010-06-08: 20 mL via INTRAVENOUS

## 2010-06-09 LAB — CK TOTAL AND CKMB (NOT AT ARMC)
CK, MB: 1.8 ng/mL (ref 0.3–4.0)
CK, MB: 1.8 ng/mL (ref 0.3–4.0)
CK, MB: 2.1 ng/mL (ref 0.3–4.0)
Relative Index: INVALID (ref 0.0–2.5)
Relative Index: INVALID (ref 0.0–2.5)
Relative Index: 2.1 (ref 0.0–2.5)
Total CK: 90 U/L (ref 7–232)
Total CK: 92 U/L (ref 7–232)
Total CK: 100 U/L (ref 7–232)

## 2010-06-09 LAB — TROPONIN I
Troponin I: <0.3 ng/mL (ref <0.30)
Troponin I: <0.3 ng/mL (ref <0.30)
Troponin I: <0.3 ng/mL (ref <0.30)

## 2010-06-09 LAB — TSH: TSH: 1.731 u[IU]/mL (ref 0.350–4.500)

## 2010-06-10 LAB — CBC
HCT: 35.6 % — ABNORMAL LOW (ref 39.0–52.0)
Hemoglobin: 11.7 g/dL — ABNORMAL LOW (ref 13.0–17.0)
MCH: 28.7 pg (ref 26.0–34.0)
MCHC: 32.9 g/dL (ref 30.0–36.0)
MCV: 87.3 fL (ref 78.0–100.0)
Platelets: 128 10*3/uL — ABNORMAL LOW (ref 150–400)
RBC: 4.08 MIL/uL — ABNORMAL LOW (ref 4.22–5.81)
RDW: 13.1 % (ref 11.5–15.5)
WBC: 4.6 10*3/uL (ref 4.0–10.5)

## 2010-06-10 LAB — BASIC METABOLIC PANEL
BUN: 9 mg/dL (ref 6–23)
CO2: 32 mEq/L (ref 19–32)
Calcium: 8.5 mg/dL (ref 8.4–10.5)
Chloride: 105 mEq/L (ref 96–112)
Creatinine, Ser: 0.84 mg/dL (ref 0.4–1.5)
GFR calc Af Amer: >60 mL/min (ref >60)
GFR calc non Af Amer: >60 mL/min (ref >60)
Glucose, Bld: 106 mg/dL — ABNORMAL HIGH (ref 70–99)
Potassium: 3.9 mEq/L (ref 3.5–5.1)
Sodium: 142 mEq/L (ref 135–145)

## 2010-06-10 NOTE — Consult Note (Signed)
[place]. [facility]  Patient:    Carlos Dawson, Carlos Dawson                     MRN: [id number] Proc. Date: 02/10/00 Adm. Date:  [id number] Attending:  Danella Penton                          Consultation Report  DATE OF BIRTH:  24-Apr-1940  PRIMARY CARE PHYSICIAN:  Oley Balm. Georgina Pillion, M.D.  CARDIOLOGIST:  Colleen Can. Deborah Chalk, M.D.  ADMITTING DOCTOR AND NEUROSURGEON:  Tanya Nones. Jeral Fruit, M.D.  REASON FOR CONSULTATION:  Postoperative fever.  HISTORY OF PRESENT ILLNESS:  [name] is a 70 year old Caucasian male with a detailed medical history as listed below who was admitted to [facility] on February 07, 2000 by Dr. Hilda Lias to undergo an L3-L4 diskectomy with an L3/L4 and L4/L5 foraminotomy.  The procedure was carried out without difficulty on February 07, 2000 and the patient for the most part has progressed well in the postoperative period.  He has been ambulating without difficulty lately.  ______ suffered, however, has been a postoperative fever.  First elevated temperature was appreciated on February 08, 2000 at 102.5.  Subsequently, the patient has two additional measurements intermittently on the level of 101.0 and 102.2.  Additionally, the patient is noted to have a moderate degree of hypoxia with O2 saturations intermittently ranging into the 80-85 range on room air and returning to the mid to low 90s on 1-2 L of nasal cannula oxygen.  Patient does report that he has had a significant degree of green sputum production and intermittent cough.  REVIEW OF SYSTEMS:  Patient denies nasal congestion or postnasal drip.  He denies chest pain or pleuritic type pain.  He denies unilateral lower extremity edema or calf pain.  Positive respiratory symptoms are as per history of present illness.  Denies palpitations, decreased appetite, abdominal pain, nausea, vomiting, dysuria, polyuria, hematuria, hematochezia, diarrhea.  He does endure  some mild amount of constipation in the postoperative period.  There has been no focal weakness, paralysis, or paraesthesia.  There has been no abrupt mood swings or severe headaches.  PAST MEDICAL HISTORY:  1. History of esophageal stricture status post dilatation 1996.  2. Gastroesophageal reflux disease with hiatal hernia.  3. Diastolic dysfunction, left ventricular hypertrophy.     a. Cardiac catheterization September 2000 showing 50% mid LAD lesion, but        otherwise unremarkable.     b. Cardiolite in June 2001 without evidence of ischemia with ejection        fraction of 68%.  4. Obesity hypoventilation syndrome/sleep apnea status post palate surgery     with nightly BiPAP at 12/7.  5. Morbid obesity with negative workup for Cushings disease in August 2000.  6. Hypertension.  7. Irritable bowel syndrome.  8. Allergic rhinitis.  9. Hypertriglyceridemia. 10. Depression. 11. Status post appendectomy 1969. 12. Status post cholecystectomy 1994. 13. Status post prior L4/L5 laminectomy November 2000. 14. Status post bilateral rotator cuff repairs 1995. 15. Status post left elbow spur removal 1995. 16. Status post intolerance to MORPHINE secondary to itching. 17. Status post flu shot 2001. 18. Status post pneumovax three to four years prior to this admission.  INPATIENT MEDICATIONS:  1. Restoril 30 mg q.h.s.  2. Valium 5 mg t.i.d.  3. Cozaar 50 mg b.i.d.  4. Tenormin 50 mg b.i.d.  5. Lasix  80 mg q.d.  6. Isosorbide mononitrate 15 mg q.d.  7. Lasix 40 mg q.d.  8. Serzone 100 mg b.i.d.  9. Cardura 1 mg q.h.s. 10. Protonix 40 mg q.h.s. 11. Potassium chloride 20 mEq q.d. 12. Albuterol 2.5 mg nebulizer q.4h. 13. Restoril 30 mg q.h.s. 14. Ancef 1 g IV q.8h. 15. Tylenol p.r.n. 16. Percocet p.r.n.  ALLERGIES:  Patient has an intolerance to MORPHINE due to itching.  FAMILY HISTORY:  Noncontributory.  SOCIAL HISTORY:  Patient lives in [place].  He is married.  He has  three children.  He has a history of smoking one and a half packs per day x 37 years, but discontinued smoking years ago.  He denies use of alcohol.  He is unemployed but he does have a prescription plan that provides assistance with him obtaining his medications.  PHYSICAL EXAMINATION:  GENERAL:  Morbidly obese 70 year old male who appears stated age lying in bed in no acute distress.  VITAL SIGNS:  Temperature currently 98.1, temperature max 102.2, pulse 94, blood pressure 123/72, respiratory rate 20, O2 saturation 91% on 1 L via nasal cannula.  HEENT:  Normocephalic, atraumatic.  Pupils are equal, round and reactive to light and accommodation.  Extraocular movements are intact bilaterally. OC/OP:  Clear.  NECK:  No lymphadenopathy or thyromegaly.  LUNGS:  Decreased breath sounds bilateral bases left greater than right with no wheezes or crackles appreciated.  CARDIOVASCULAR:  Heart sounds distant, but regular rate and rhythm with 2/6 systolic murmur, but no S3 or S4.  ABDOMEN:  Morbidly obese, nontender, nondistended, soft.  Bowel sounds present.  No hepatosplenomegaly.  No ascites appreciable.  EXTREMITIES:  Trace edema bilateral lower extremity with 1+ dorsalis pedis pulses bilaterally.  No cyanosis.  SKIN:  Postoperative scar in midline of lumbar region back without surrounding erythema or discharge.  NEUROLOGIC:  Cranial nerves 2-12 intact bilaterally.  Intact sensation to touch throughout.  Strength 5/5 throughout bilateral upper and lower extremities.  Alert and oriented x 4.  LABORATORIES:  From February 08, 2000:  Urinalysis which is negative.  Urine culture which is without growth at this point.  Hemoglobin 11.2, platelets 189,000, MCV 87.4, white blood cell count 16.2, ANC 12.5.  From February 03, 2000:  Sodium 143, potassium 3.8, chloride 103, CO2 37, BUN 21, creatinine 1.1, glucose 159, calcium 8.9.  From February 08, 2000:  Chest x-ray showing increased  markings at bilateral bases, most prominent in the left base, atelectasis versus infiltrate.  IMPRESSION AND PLAN:  1. Postoperative fever.  The most likely etiology at this time is atelectasis    versus bronchitis or pneumonia.  Given the patients elevated white count    as well as his sputum production, I favor the latter.  It is unclear as to    whether this represents a community acquired pneumonia that has simply    progressed with the acute stress of surgery or a true nosocomial pneumonia.    I will begin coverage with Rocephin and follow symptoms clinically.  I will    attempt to obtain sputum cultures to identify the exact pathogen.  Chest    x-ray as well as O2 saturation and clinical examination will be followed    for signs of improvement.  I doubt this represents a direct complication of    the procedure as the patients wound is intact without erythema or    discharge.  I will encourage ongoing incentive spirometry to address the    issue of atelectasis.  Additionally, given the patients audible murmur, I    will obtain blood cultures to assure that there is no element of    bacteremia.  My service will continue to follow. 2. Sleep apnea.  I will resume the patients BiPAP regimen as dosed at home    with 12 cm of water on inspiration and 7 on expiration.  We will follow O2    saturations closely. 3. Hypertension.  Continue current medicines as listed above and follow.  At    this time blood pressure is well controlled. 4. History of diastolic dysfunction.  At this time there appears to be no    element of exacerbation of the patients diastolic dysfunction.  Will    continue to follow and treat exacerbations as necessary. 5. Systolic murmur.  Review of previous records reveals a history of a    systolic murmur and with echocardiogram previously performed there has been    no evidence of valvular disorder and therefore I will simply continue to    follow and not pursue further  workup at this time.  Thank you for the consultation on this pleasant gentleman.  We will continue to follow and leave further recommendations as necessary. DD:  02/10/00 TD:  02/11/00 Job: [id number]

## 2010-06-10 NOTE — H&P (Signed)
NAME:  Carlos Dawson, LIMBACH NO.:  [id number]   MEDICAL RECORD NO.:  [id number]          PATIENT TYPE:  AMB   LOCATION:                               FACILITY:  [facility]   PHYSICIAN:  Colleen Can. Deborah Chalk, M.D.DATE OF BIRTH:  17-Jun-1940   DATE OF ADMISSION:  02/12/2004  DATE OF DISCHARGE:                                HISTORY & PHYSICAL   CHIEF COMPLAINT:  Chest pain.   HISTORY OF PRESENT ILLNESS:  Mr. Steinborn is a pleasant, 70 year old white  male who has a known history of ischemic heart disease with known single  vessel disease.  His other problems include morbid obesity, diastolic  dysfunction, hypertension as well as sleep apnea.  He presented to the  office as a work-in appointment on January 13th with complaints of chest  tightness that began last Thursday.  It seemingly is related to his  activities and is relieved with rest.   However, his most concerning symptom was that of worsening shortness of  breath over the course of the weekend while he was visiting and [place]  He has had no real symptoms at rest.  His last Cardiolite study dates back to May 2005 at which time it was  satisfactory.  Unfortunately, he has not been successful with weight loss.  In light of his symptomatology, as well as known history of coronary  disease, he is now referred for repeat cardiac catheterization.   PAST MEDICAL HISTORY:  1.  Known atherosclerotic cardiovascular disease.  He has a history of      cardiac catheterization dating back to September 2000, showing normal LV      function, elevated left ventricular filling pressures, moderate 40-50%      stenosis in the mid-portion of the LAD with a large dominant left      circumflex, and a known diminutive right coronary artery that was not      able to be cannulated.  2.  Hypertensive heart disease.  3.  Known diastolic dysfunction.  Last 2-D echocardiogram was in December      '02.  4.   Morbid obesity.  5.  History of sleep apnea.  6.  History of headaches.  7.  History of hypokalemia.  8.  Gastroesophageal reflux disease.  9.  History for esophageal strictures with previous dilatation.  10. Hiatal hernia.  11. Irritable bowel syndrome.  12. Allergic rhinitis.  13. History of depression.   PAST SURGICAL HISTORY:  Appendectomy, cholecystectomy, posterior cervical  diskectomy, L4-5 laminectomy, bilateral rotator cuff surgery, previous left  elbow surgery for spurs.   ALLERGIES:  None, however, he notes that morphine causes itching.   FAMILY HISTORY:  His sister died at 63 with a heart attack. Mother died at  age 52 with a heart attack as well.   SOCIAL HISTORY:  He is married.  He has a remote history of tobacco use. He  has no alcohol use.   REVIEW OF SYSTEMS:  Basically as noted above and is otherwise unremarkable.   PHYSICAL EXAMINATION:  VITAL SIGNS:  Blood pressure 130/80 sitting, 150/90  standing, heart rate 56 and regular, respirations 18, afebrile, weight 291  pounds.  GENERAL:  He is a pleasant, middle-aged white male who is obese.  He is in  no acute distress.  SKIN:  Warm and dry, color unremarkable.  LUNGS:  Basically clear.  CARDIOVASCULAR:  Regular rhythm.  Heart tones are somewhat distant due to  his body habitus.  ABDOMEN:  Obese, soft, positive bowel sounds, nontender.  EXTREMITIES:  Without edema.  NEUROLOGIC:  Intact.  There are no gross focal deficits.   LABORATORY DATA:  Pertinent labs are pending.   OVERALL IMPRESSION:  1.  Chest pain.  2.  Known ischemic heart disease with single vessel disease in the left      anterior descending artery.  3.  Morbid obesity.  4.  Hypertension.  5.  History of sleep apnea.   PLAN:  Will proceed on with repeat cardiac catheterization.  The procedure  has been reviewed in full detail, including the risks and benefits and he is  willing to proceed on Friday, February 12, 2004.        ________________________________________  Sharlee Blew, N.P.  ___________________________________________  Colleen Can. Deborah Chalk, M.D.    LC/MEDQ  D:  02/05/2004  T:  02/05/2004  Job:  [id number]

## 2010-06-10 NOTE — H&P (Signed)
[place]. [facility]  Patient:    Carlos Dawson, Carlos Dawson                     MRN: [id number] Adm. Date:  [id number] Attending:  Lorre Nick Dictator:   Rosanne Sack, M.D. CC:         Oley Balm. Georgina Pillion, M.D.             Colleen Can. Deborah Chalk, M.D.             Charolett Bumpers III, M.D.             Barbaraann Share, M.D. [facility]             Tanya Nones. Jeral Fruit, M.D.                         History and Physical  DATE OF BIRTH:  June 29, 2040  PROBLEM LIST:  1. Chest pain, rule out myocardial infarction.  2. Progressive weight gain with lower extremity edema.  Question right-sided     heart failure.  3. Progressive dysphagia.  History of esophageal strictures, last dilation     about five years ago.  Gastroesophageal reflux disease.  Hiatal hernia.  4. History of congestive heart failure, diastolic dysfunction with     hypertensive heart disease.  A 2-D echocardiogram (8/00) showed ejection     fraction 50 to 60%, moderate left ventricular hypertrophy, no signs of     pulmonary hypertension.  Cardiac catheterization (09/24/98) normal left     main, left anterior descending artery with a mid 50% lesion, normal     circumflex, small right coronary artery, unable to be cannulated.  5. Obesity/sleep apnea.  Status post palate surgery.  CPAP q.h.s.     noncompliance due to intolerance to mask.  6. Central morbid obesity, moon face, buffalo hump.  Negative workup for     Cushings in 8/00.  7. Hypertension.  8. Slight prerenal azotemia.  9. Irritable bowel syndrome. 10. History of Giardia infection in the past. 11. Allergic rhinitis. 12. Elevated triglycerides. 13. Depression. 14. Status post appendectomy in 1969, status post cholecystectomy in 1994,     status post posterior cervical diskectomy in 1989, status post L4-L5     laminectomy in 11/00, status post bilateral rotator cuff surgery in 1995,     status post left elbow spur removal in 1995.  CHIEF COMPLAINT:  Chest  pain, shortness of breath.  HISTORY OF PRESENT ILLNESS:  Carlos Dawson is a 70 year old male who woke up this morning with substernal chest pressure and shortness of breath.  This pain was radiating to the left side of the neck.  When he arrived to the emergency department, this pain was about 7/10.  After his first nitroglycerin sublingually, this pain resolved.  The patient denies diaphoresis, nausea, vomiting, palpitations associated with the chest symptoms.  He took about four baby aspirin this morning.  The patient describes somewhat similar symptoms for at least a year.  This morning the symptoms were more intense, and that was the reason for which he came to the emergency department.  In 9/00, the patient was cathed by Dr. Roger Shelter, showing just a 50% mid left anterior descending artery, otherwise no significant findings.  The patient denies cough, hemoptysis, hematemesis, orthopnea, paroxysmal nocturnal dyspnea.  He describes rhinorrhea, increased weight about 50 pounds over the last two months.  His Lasix was increased to double from 80  to 160 by Dr. Lajean Manes without success.  He also describes somewhat chronic lower extremity edema.  No leg pain, no claudication syndrome.  No syncope.  No fever, no chills, no diarrhea, no constipation.  No melena, no tarry stools, bright red blood per rectum.  No urinary symptoms.  No focal weakness.  The patient describes progressive dysphagia, mostly for solids.  PAST MEDICAL HISTORY:  As problem list.  ALLERGIES:  No known drug allergies.  MEDICATIONS:  1. Lasix 160 mg p.o. q.d.  2. Cozaar 50 mg b.i.d.  3. Atenolol 50 mg b.i.d.  4. Baby aspirin one tablet p.o. q.d.  5. Serzone 100 mg b.i.d.  6. Claritin 10 mg p.o. q.d.  7. Vioxx 25 mg p.o. q.d.  8. K-Dur 20 mEq p.o. b.i.d.  9. Occasional Prevacid as needed. 10. Rhinocort one puff to each nostril b.i.d.  FAMILY HISTORY:  Significant for early coronary artery disease.  The  patients sister died at age 39 from an acute myocardial infarction.  His mother had an myocardial infarction at the age of 36.  His mother also had diabetes and hypertension.  His sister has hypertension.  No strokes in the family.  His brother died of bladder cancer recently.  SOCIAL HISTORY:  The patient is married.  He works as a Medical illustrator.  He quit smoking about seven years ago.  He does not drink.  He has three children, ages of 90, 63, and 48.  REVIEW OF SYSTEMS:  As HPI.  No synovitis, no skin rash, no focal weakness, no amaurosis fugax, no abdominal cramping or diarrhea.  PHYSICAL EXAMINATION:  VITAL SIGNS:  Temperature 98.0, heart rate 53, respirations 20, blood pressure 151/90, follow up blood pressure 86/49 after nitroglycerin sublingual.  Oxygen saturation 95% on room air.  HEENT:  Normocephalic, atraumatic.  Nonicteric sclerae.  Conjunctivae within normal limits.  PERRLA.  Extraocular movements intact.  Funduscopic examination negative for papal edema or hemorrhages.  Tympanic membranes within normal limits.  Slight dry mucus membranes.  Some signs of postnasal drip, otherwise clear oropharynx.  NECK:  Supple, no JVD, no bruits, no adenopathy, no thyromegaly.  LUNGS:  Clear to auscultation bilaterally without crackles or wheezes.  Fair to poor air movement bilaterally.  CARDIAC:  Regular rate and rhythm, slightly bradycardic though.  A 1/6 systolic ejection murmur at the left lower sternal border.  No S3, no S4, no rubs.  ABDOMEN:  Obese, nontender, nondistended, bowel sounds were present, no hepatosplenomegaly.  No masses, no rebound, no bruits, no guarding.  GASTROURINARY:  Within normal limits.  RECTAL:  Normal prostatic size.  Empty vault.  Normal sphincter tone.  EXTREMITIES:  There is 2+ pitting edema of both lower extremities.  Pulses 1+ bilaterally.  No cyanosis or clubbing.   NEUROLOGIC:  Alert and oriented x 3.  Strength 5/5 in all extremities.   Deep tendon reflexes 3/5 in all extremities.  Cranial nerves II-XII grossly intact. Sensory intact.  Plantar reflexes downgoing bilaterally.  LABORATORY DATA:  EKG shows sinus bradycardia with a heart rate of 48. Unchanged EKG since 9/00.  No acute findings.  Chest x-ray showed cardiomegaly, no pulmonary edema, no infiltrates.  CK 135, CK-MB 1.1, troponin-I pending.  Sodium 140, potassium 3.6, chloride 105, CO2 27, BUN 22, creatinine 0.9, glucose 135.  Liver function tests within normal limits. Hemoglobin 14.0, MCV 82, white blood cell count 6.6, platelets 188.  ASSESSMENT AND PLAN: 1. Chest pain, rule out myocardial infarction.  The presentation of this chest  pain episodes seems to be somewhat atypical for a cardiac source.  Given    the patients cardiac risk factors, we are to rule out myocardial    infarction.  The EKG is unchanged since 9/00.  The first set of cardiac    enzymes are negative.  Currently, the patient is chest pain free.  Noted is    the borderline hypertension induced by the nitroglycerin given    sublingually.  No evidence of posterior wall ischemia by EKG, though    hypertension induced by nitroglycerin and bradycardia are typical symptoms    of right coronary artery ischemia.  Noticed that back in 9/00, the cardiac    catheterization performed by Dr. Deborah Chalk was unable to cannulate the right    coronary artery due to its small size.  I spoke with Dr. [name], on call    for Dr. Deborah Chalk, who agrees with the current assessment and treatment plan.    The patient is being admitted to a telemetry bed.  Aspirin, beta blocker,    oxygen will be provided for cardiac protection.  For now, we will hold the    heparin, though we will use Lovenox for deep venous thrombosis prophylaxis.    The cardiac enzymes and EKG series will be obtained throughout this    hospital stay.  An Adenosine Cardiolite was scheduled for tomorrow morning.    If the cardiac workup were to be  negative, an esophagogastroduodenoscopy as    an outpatient will be arranged to rule out recurrent esophageal strictures. 2. Progressive weight gain with lower extremity edema.  Noted that despite    Lasix was doubled about two months ago, the patients weight continues to    increase.  There was no evidence of Cushings by the workup performed in    8/00.  Also noted the lower extremity edema.  The patients albumin is    normal at 3.9. Of significance is the fact that Carlos Dawson is not using his    CPAP at night because he is not able to tolerate the mask.  Despite the    lack of evidence of right-sided heart failure by the 2-D echocardiogram    obtained in 8/00, this weight gain and lower extremity edema could be    related to right-sided heart failure due to secondary pulmonary    hypertension.  We will go ahead and check lower extremity venous Dopplers    to rule out deep venous thrombosis.  The arterial blood gases is at    baseline, the arterial blood gas on room air is 7.44, 43, and 76.    Pulmonary emboli is less likely.  A 2-D echocardiogram should be considered    in the near future to re-evaluate the right-sided chambers and right-sided    heart pressures. 3. Hypertension.  Noted a borderline hypertension after the nitroglycerin    given sublingually.  Also of remark is the prerenal azotemia.  I believe    that this patient may have some degree of intravascular volume depletion.    For now, we will decrease the patients Lasix back to 80 mg q.d.  The fluid    balance will be monitored.  Also, daily weights will be obtained.  We will    follow up the patients blood pressure response to these new changes. 4. Obesity/sleep apnea.  We will ask the respiratory tech to use a new type    of face mask that Carlos Dawson may tolerate better than  the one given about    1-1/2 years ago.  If any problems were to arise from the sleep apnea    standpoint, we will obtain a consult with Dr. Marcelyn Bruins. DD:  07/07/99 TD:  07/07/99 Job: [id number]

## 2010-06-10 NOTE — H&P (Signed)
NAME:  Carlos Dawson, Carlos Dawson NO.:  [id number]   MEDICAL RECORD NO.:  [id number]          PATIENT TYPE:  INP   LOCATION:  1424                         FACILITY:  [facility]   PHYSICIAN:  Jackie Plum, M.D.DATE OF BIRTH:  29-Mar-1940   DATE OF ADMISSION:  03/07/2006  DATE OF DISCHARGE:                              HISTORY & PHYSICAL   CHIEF COMPLAINT:  Back pain.   HISTORY OF PRESENT ILLNESS:  A 70 year old gentleman who presented with  a 2-day history of back pain involving the right upper area.  Pain was  said to be severe, worsened by coughing and deep breathing.  He has had  some associated chest pain with shortness of breath, but denied any  history of fever, chills, nausea or vomiting.   __________  workup revealed elevated D-dimer.  Followup CT scan of the  chest revealed bilateral PE.  Patient was admitted for further  evaluation and management of her pulmonary embolism.   PAST MEDICAL HISTORY:  Patient recently had a left ankle surgery.  He  has history of hypertension.   SOCIAL HISTORY:  Patient does not smoke cigarettes, nor drink alcohol.   ALLERGIES:  PATIENT IS ALLERGIC TO MORPHINE.   CURRENT MEDICATIONS:  1. Atenolol.  2. Lasix.  3. Omeprazole.  4. Potassium chloride.   __________  negative for thromboembolism.   PHYSICAL EXAMINATION:  VITAL SIGNS:  Stable.  GENERAL:  Obese Caucasian gentleman, lying in bed, not in acute  cardiopulmonary distress.  HEENT:  Normocephalic, atraumatic.  Pupils equal, round, reactive to  light.  Extraocular movements intact.  Oropharynx moist.  LUNGS:  Clear to auscultation.  No crackles or wheezes.  CARDIAC:  Regular, no gallops.  ABDOMEN:  Soft, nontender, bowel sounds present.  EXTREMITIES:  No cyanosis.  CNS:  Patient is alert and appropriate.   LABORATORY:  CBC:  Patient had a white count of 10,600, otherwise CBC  was unremarkable.  D-dimer was elevated as listed above, with D-dimer of  1.6, sodium of 136,  potassium 3.5, glucose 135.  Otherwise, basic  metabolic panel was unremarkable.  Point of care cardiac markers  negative.   IMPRESSION:  Bilateral pulmonary edema.  Patient admitted for  anticoagulation.  Patient has had recent surgery, and this recent  surgery may be __________ .  I have opted not to pursue any  hypercoagulable workup.  At this time,  patient __________ for about 6 months.  __________ platelet count and  hematologic indices.  Patient will need education on Coumadin treatment.  Patient with known history of atherosclerotic cardiovascular disease.  Will need to keep his hemoglobin around 10.0.      Jackie Plum, M.D.  Electronically Signed     GO/MEDQ  D:  03/08/2006  T:  03/08/2006  Job:  [id number]

## 2010-06-10 NOTE — Cardiovascular Report (Signed)
NAME:  JAWAUN, CELMER NO.:  [id number]   MEDICAL RECORD NO.:  [id number]          PATIENT TYPE:  OIB   LOCATION:  6501                         FACILITY:  [facility]   PHYSICIAN:  Colleen Can. Deborah Chalk, M.D.DATE OF BIRTH:  1940-05-29   DATE OF PROCEDURE:  02/12/2004  DATE OF DISCHARGE:                              CARDIAC CATHETERIZATION   Mr. Fouse presents with atypical chest pain.  He has known hypertensive  heart disease and known mild coronary atherosclerosis in the left anterior  descending with a dominant left circumflex coronary anatomy.  He is referred  now for cardiac catheterization and coronary arteriograms.   PROCEDURES:  1.  Left heart catheterization with selective coronary angiogram.  2.  Attempted left ventricular angiography.  3.  Aortic root angiography with multiple attempts to cross the aortic      valve.   TYPE AND SITE OF ENTRY:  Percutaneous right femoral artery.   CATHETERS:  1.  A 6 curved Judkins 5 French left coronary catheter.  2.  A 4 French Judkins right coronary catheter 4 curved.  3.  A 5 French pigtail ventriculographic catheter.   CONTRAST MATERIAL:  Omnipaque.   MEDICATIONS GIVEN PRIOR TO PROCEDURE:  Valium 7 mg p.o.   MEDICATIONS GIVEN DURING PROCEDURE:  versed 2 mg IV.   COMMENTS:  The procedure was difficult because of a very large aortic root.  We subsequently had to use 6 French catheters in order to engage the left  coronary ostium.  It was a dual ostium, which also made the procedure  difficult.  The aortic root was enlarged.  While it appeared that there was  satisfactory opening of the aortic valve, we were unable to cross the aortic  valve with a pigtail catheter, with the J-tipped guide wire or the straight  guide wire or using the Amplatz guide.  It was felt that followup  echocardiograms would be most helpful, especially in light of the relatively  normal coronary anatomy.   HEMODYNAMIC DATA:  To aortic  pressure was 145/88.   ANGIOGRAPHIC DATA:  1.  The right coronary artery was small and nondominant.  2.  The left main coronary artery is very short and essentially a dual      ostium.  3.  The left circumflex is a very large vessel.  There are minimal      irregularities.  It is dominant.  It is normal.  4.  The left anterior descending has a large septal perforating branch.  It      is of only moderate size and ends prior to the apex.  The apical vessels      come from the left circumflex.  The left anterior descending has minimal      atherosclerosis of less than 20%.  5.  The aortic root is markedly enlarged.  There is no aortic insufficiency      noted.   OVERALL IMPRESSION:  1.  Marked enlargement of the aortic root with deformed aortic valve without      aortic insufficiency.  Unable to cross the aortic valve on multiple  attempts.  2.  Left dominant coronary system, but with minimal to no significant      coronary artery disease.   DISCUSSION:  Will need to arrange for Mr. Earll to have a 2-D  echocardiogram for evaluation of left ventricular function and possible  gradient across the aortic valve since we were unable to cross with multiple  guide wires and catheters.  Weight loss and management of his blood pressure  will be imperative.       SNT/MEDQ  D:  02/12/2004  T:  02/12/2004  Job:  [id number]

## 2010-06-10 NOTE — Discharge Summary (Signed)
[place]. [facility]  Patient:    ARISTOTLE, LIEB                     MRN: [id number] Adm. Date:  [id number] Disc. Date: [id number] Attending:  Danella Penton                           Discharge Summary  DISCHARGE DIAGNOSES:   HISTORY OF PRESENT ILLNESS:  The patient is a 70 year old gentleman who was admitted to the hospital by Dr. Hilda Lias for treatment of left L3-4 herniated disk and a 4-5 foraminal stenosis.  HOSPITAL COURSE:  He was admitted and underwent a left L3-4 and 4-5 foraminotomy and diskectomy, and L5-S1 foraminotomy and decompression of the L4-5 and S1 nerve roots on the left side. Postoperatively, the patient started to have problems with fever. It was felt he had a left lower lobe pneumonia. He was seen by the hospitalist for this reason. He will be discharged home with a prescription for erythromycin, Azithromycin and with Percocet for his back pain. At the time of discharge he has a small amount of drainage from a portion of his wound. He is tolerating a regular diet without difficulty and is afebrile at discharge.  FOLLOWUP: I will see him on Friday for wound check. DD:  02/13/00 TD:  02/14/00 Job: [id number]

## 2010-06-10 NOTE — Discharge Summary (Signed)
[name]. [facility]  Patient:    Carlos Dawson                      MRN: [id number] Adm. Date:  [id number] Disc. Date: 12/23/98 Attending:  Lonell Face                           Discharge Summary  ADMITTING DIAGNOSES: 1. Lumbar stenosis. 2. Cervical spondylosis. 3. Obesity. 4. Diabetes. 5. Hypertension.  FINAL DIAGNOSES: 1. Lumbar stenosis. 2. Cervical spondylosis. 3. Obesity. 4. Diabetes. 5. Hypertension.  HISTORY OF PRESENT ILLNESS:  The patient in the past underwent posterior cervical diskectomy.  Right now, I have been following this patient for several months because of back and bilateral leg pain.  We did a myelogram back in July 2000, which showed stenosis at the level 4-5.  Surgery was advised.  LABORATORY DATA:  Grossly normal except for increase in PT and PTT.  The patient had been taking aspirin.  HOSPITAL COURSE:  The patient was taken to surgery, and bilateral L4-5 laminectomy and decompression as well as foraminotomy were achieved.  After surgery, the gentleman needed a Hemovac because of the bleeding secondary to aspirin. Nevertheless, it was removed 24 hours later, and today he is doing great; walking, ambulating, no weakness, and the pain has improved.  He is being discharged to e followed by me.  CONDITION ON DISCHARGE:  Improvement.  DISCHARGE MEDICATIONS:  Demerol and diazepam.  DIET:  He is to continue with diabetic and low salt diet.  ACTIVITY:  He is not to drive until I see him.  FOLLOW-UP:  He is to be seen by me in a week. DD:  12/23/98 TD:  12/24/98 Job: [id number]

## 2010-06-10 NOTE — Discharge Summary (Signed)
NAME:  Carlos Dawson, Carlos Dawson              ACCOUNT NO.:  [id number]   MEDICAL RECORD NO.:  [id number]          PATIENT TYPE:  INP   LOCATION:  1424                         FACILITY:  [facility]   PHYSICIAN:  Kela Millin, M.D.DATE OF BIRTH:  1940/05/03   DATE OF ADMISSION:  03/08/2006  DATE OF DISCHARGE:  03/14/2006                               DISCHARGE SUMMARY   DISCHARGE DIAGNOSES:  1. Pulmonary embolus, bilateral.  2. Hypertension.  3. History of left ankle surgery.   PROCEDURES AND STUDIES:  CT angiogram of chest - bilateral acute  pulmonary embolism localizing to the lower lobe with higher clot burden  on the right.  Stable aneurysmal disease of the ascending thoracic aorta  reaching the maximal caliber of 4.5 to 4.6 cm.  No aortic dissection.   HISTORY:  The patient is a 70 year old white male who presented with a  complaint of upper back pain, right side.  He reported that the pain was  severe, worsened by cough and deep breathing.  He also stated that he  had had some associated chest pain with shortness of breath but denied  fevers, chills, nausea and no vomiting.  In the ER, workup revealed an  elevated D-dimer and a CT angiogram of the chest was done and the  results as stated above - bilateral pulmonary embolus.  The patient was  admitted for further evaluation and management.   PHYSICAL EXAM UPON ADMISSION:  As per Dr. Julio Sicks.  GENERAL:  An obese white male in no acute respiratory distress.  VITAL SIGNS:  Reported to be stable.  LUNGS:  Clear to auscultation bilaterally.  CARDIOVASCULAR:  Regular rate and rhythm.  The rest of the physical exam was also reported to be within normal  limits.   LABORATORY DATA:  White cell count 10.6, D-dimer 1.6, sodium of 136,  potassium 3.5, glucose 135.   HOSPITAL COURSE:  1. Bilateral pulmonary emboli - Upon admission the patient was started      on anticoagulation with Lovenox and Coumadin subsequently added.      His PT, INR  was monitored and once the INR was therapeutic it was      overlapped with Lovenox and it remained therapeutic again the next      hospital day.  Following that, the patient was discharged home on      Coumadin.  Patient's INR upon discharge 2.7 and he was discharged      home on Coumadin 12.5 mg q.p.m. and he was to follow up with Dr.      Deborah Chalk, as per his preference, for monitoring of his PT, INR and      the Coumadin dose to be adjusted as appropriate.  2. Hypertension - Patient was maintained on his outpatient      antihypertensive during his hospital stay.   DISCHARGE MEDICATIONS:  1. Coumadin 12.5 mg q.p.m.  2. Patient to continue atenolol, Lasix, Prilosec and KCL.   FOLLOWUP CARE:  1. Dr. Deborah Chalk as scheduled for PT, INR.  2. Primary care physician.      Kela Millin, M.D.  Electronically Signed  ACV/MEDQ  D:  05/11/2006  T:  05/11/2006  Job:  [id number]   cc:   Colleen Can. Deborah Chalk, M.D.  Fax: [phone]   Oley Balm. Georgina Pillion, M.D.  Fax: [phone]

## 2010-06-10 NOTE — Consult Note (Signed)
[place]. [facility]  Patient:    Carlos Dawson, Carlos Dawson                       MRN: [id number] Proc. Date: 02/07/00 Adm. Date:  02/07/00 Attending:  Tanya Nones. Jeral Fruit, M.D.                          Consultation Report  HISTORY OF PRESENT ILLNESS:  Carlos Dawson is a gentleman who underwent bilateral 4-5 laminectomy almost 1-1/2 years ago because of lumbar stenosis and radiculopathy.  Now he is complaining of back pain with radiation down to the left side all the way down to the left foot.  Conservative treatment has not been helpful.  We did an MRI and flexion/extension lumbar spine and because of the pain he wants to go ahead with surgery as soon as possible.  PAST MEDICAL HISTORY:  This gentleman has a history of difficulty swallowing, weight gain, chest pain, history of congestive heart failure, hypertension, irritable bowel syndrome, rhinitis, increase of triglyceride depression, appendectomy, cholecystectomy, cervical diskectomy, and 4-5 laminectomy, rotator cuff injury and a spur removed from the elbow in 1995.  FAMILY HISTORY:  Unremarkable.  ALLERGIES:  No known drug allergies.  MEDICATIONS: 1. Lasix. 2. Some medication for high blood pressure. 3. Some anti-inflammatories.  SOCIAL HISTORY:  The patient does not smoke or drink.  PHYSICAL EXAMINATION:  HEENT:  Normal.  NECK:  There is a scar from previous surgery.  He has ______ lumbar spine.  LUNGS:  Clear.  HEART SOUNDS:  Normal.  ABDOMEN:  No masses.  EXTREMITY:  Normal pulses, no edema.  NEUROLOGIC:  Mental status:  Normal.  Cranial nerves:  Normal.  Reflexes 2+, no Babinski, sensation normal.  Strength:  He is weak in the left foot on dorsiflexion and some mild weakness of the left quadriceps.  ______ is positive at the left side at about 45 degrees and on the right side about 80. Fabere testing and McMurray was positive on the left side and negative on the right side.  The MRI showed  that he has a herniated disk at the level of 3-4 on the left side, and foraminal stenosis at the level of 4-5.  He has an incidental degenerative disk disease at the level of right 5-1, which is completely asymptomatic.  CLINICAL IMPRESSION:  Left 3-4 herniated disk with a 4-5 foraminal stenosis.  RECOMMENDATION:  The patient is going to be admitted for surgery.  We are going to proceed with a 3-4 diskectomy and foraminotomy at the level of 3-4 and 4-5.  He knows about the risks such as infection, CSF leak, worsening of pain, paralysis, need for further surgery.  The patients lumbar spine showed some mild movement between 3-4, 4-5, with flexion extension but since the procedure is going to be unilateral there is no need for fusion.  There is no question and the the risks of the surgery are secondary to his obesity and the previous cardiovascular injury.  He knows that the risks are infection, CSF leak, worsening pain, need for further surgery which might require fusion. DD:  02/07/00 TD:  02/07/00 Job: [id number]

## 2010-06-10 NOTE — Op Note (Signed)
   NAME:  Carlos Dawson, Carlos Dawson NO.:  [id number]   MEDICAL RECORD NO.:  [id number]                   PATIENT TYPE:  AMB   LOCATION:  ENDO                                 FACILITY:  [facility]   PHYSICIAN:  Danise Edge, M.D.                DATE OF BIRTH:  September 10, 1940   DATE OF PROCEDURE:  10/31/2001  DATE OF DISCHARGE:                                 OPERATIVE REPORT   PROCEDURE INDICATION:  The patient is a 70 year old male born October 02, 1940.  He underwent a colonoscopy approximately 10 years ago.  He is  scheduled to undergo surveillance colonoscopy with polypectomy to prevent  colon cancer.  He is experiencing left lower quadrant abdominal discomfort,  unassociated with gastrointestinal bleeding.   ENDOSCOPIST:  Danise Edge, M.D.   PREMEDICATION:  Versed 10 mg, fentanyl 50 mcg.   ENDOSCOPE:  Olympus pediatric colonoscope.   PROCEDURE:  After obtaining an informed consent, the patient was placed in  the left lateral decubitus position.  I administered intravenous Versed and  intravenous fentanyl to achieve conscious sedation for the procedure.  The  patient's blood pressure, oxygen saturation, and cardiac rhythm were  monitored throughout the procedure and documented in the medical record.   Anal inspection was normal.  Digital rectal exam revealed a non-nodular  prostate. The Olympus pediatric video colonoscope was introduced into the  rectum and easily advanced to the cecum.  Colonic preparation for the exam  today was satisfactory.   RECTUM:  Normal.  SIGMOID COLON AND DESCENDING COLON:  Normal.  SPLENIC FLEXURE:  Normal.  TRANSVERSE COLON:  Normal.  HEPATIC FLEXURE:  Normal.  ASCENDING COLON:  Normal.  CECUM AND ILEOCECAL VALVE:  From the proximal cecum, a 2-3 mm sessile polyp  was removed with the electrocautery snare.  Only a portion of this polyp  could be retrieved for pathological evaluation.    ASSESSMENT:  A 2-3 mm sessile polyp  was removed from the proximal cecum;  otherwise, normal proctocolonoscopy to the cecum.  I see no pathology in the  left colon to account for the patient's left lower quadrant abdominal  discomfort.                                               Danise Edge, M.D.    MJ/MEDQ  D:  10/31/2001  T:  10/31/2001  Job:  [id number]   cc:   Oley Balm. Georgina Pillion, M.D.  [address]  [place]  [place] [address]  Fax: [phone]   Alfonse Ras, MD  [facility] [address], [place], [place]

## 2010-06-10 NOTE — Consult Note (Signed)
[place]. [facility]  Patient:    Carlos Dawson, Carlos Dawson                     MRN: [id number] Adm. Date:  [id number] Attending:  Lorre Nick CC:         Rosanne Sack, M.D.             Oley Balm Georgina Pillion, M.D.             Colleen Can. Deborah Chalk, M.D.             Charolett Bumpers III, M.D.             Barbaraann Share, M.D. [facility]                          Consultation Report  HISTORY OF PRESENT ILLNESS:  Mr. Applegate is a 70 year old white male who has multiple medical problems. He presented for evaluation of substernal chest pressure and shortness of breath this morning. The patient states he has had these symptoms off and on for several weeks. His symptoms are typically worse in the early morning. Once he gets up and moving around during the day, it improves but then gets worse again at night. His pain is described as midsternal fullness or tightness. Sometimes he has radiation into his neck. He does have difficulty swallowing. He denies any diaphoresis, nausea, vomiting, or palpitations. The patient has also noted that he tends to retain fluid more easily. The patient did undergo cardiac evaluation September of 2000. An echocardiogram showed an ejection fraction of 50 to 60% with moderate LVH. There were no signs of pulmonary hypertension at that time. He underwent cardiac catheterization which showed a 50% stenosis in the mid LAD. The right coronary is described as small but could not be directly cannulated. Recommendations were made for a follow-up Cardiolite study as an outpatient; but due to intervening back surgery, this was never completed; and he has had not follow-up since then. The patient does describe that he was able to lose approximately 30 pounds but has been steadily gaining weight back about 8 to 10 pounds. He does describe some increased lower extremity edema.  PAST MEDICAL HISTORY:  Significant for:  1. Esophageal strictures. Status post  dilatation with chronic     gastroesophageal reflux disease and hiatal hernia.  2. History of congestive heart failure due to diastolic dysfunction with LVH.  3. Obstructive sleep apnea.  4. Obesity.  5. Hypertension.  6. Irritable bowel syndrome.  7. Allergic rhinitis.  8. Elevated triglycerides.  9. Depression.  PAST SURGICAL HISTORY:  Include:  1. Appendectomy.  2. Cholecystectomy.  3. Posterior cervical diskectomy.  4. L4, L5 laminectomy.  5. Bilateral rotator cuff surgery.  6. Previous left elbow surgery for spur.  PREVIOUS MEDICATIONS:  1. Lasix 160 mg per day.  2. Cozaar 50 mg b.i.d.  3. Atenolol 50 mg b.i.d.  4. Baby aspirin daily.  5. Serzone 100 mg b.i.d.  6. Claritin 10 mg per day.  7. Vioxx 25 mg daily.  8. Potassium 20 mEq b.i.d.  9. Prevacid p.r.n. 10. Rhinocort b.i.d.  FAMILY HISTORY:  Significant for early coronary disease. His sister died at age 26 from a myocardial infarction. Mother died at age 36 from a myocardial infarction.  SOCIAL HISTORY:  He is married. He is a Medical illustrator. He quit smoking seven years ago. He denies alcohol use.  PHYSICAL EXAMINATION:  GENERAL:  The patient is a morbidly obese, white male in no apparent distress.  VITAL SIGNS:  He is afebrile, heart rate is 60, respirations 20, blood pressure is 150/90.  HEENT:  Unremarkable.  NECK:  He has a very thick neck with no obvious JVD or bruits.  LUNGS:  Clear.  CARDIAC:  Reveals regular rate and rhythm without murmurs, rubs, clicks, or gallops.  ABDOMEN:  Morbidly obese, soft, and nontender without masses or bruits.  EXTREMITIES:  Revealed 1+ pitting edema. Pulses are 2+ and symmetric. He has no cyanosis.  NEUROLOGICAL:  Grossly intact.  LABORATORY DATA:  ECG shows sinus bradycardia with no acute findings.  Chest x-ray shows cardiomegaly with no edema.  CPK, MB, and troponin are negative. Potassium is 3.6, other chemistries were normal. CBC is normal.  IMPRESSION:   1. Chest pain. I doubt this is cardiac related, but it does need to be     evaluated. The patient had borderline coronary disease by catheterization     in September of 2000.  2. Obstructive sleep apnea.  3. Morbid obesity.  4. Hypertension.  5. History of congestive heart failure related to diastolic dysfunction.  PLAN:  The patient is scheduled for a two-day rest/stress adenosine Cardiolite. If this shows no ischemia, would assume that his pain is not cardiac. DD:  07/07/99 TD:  07/07/99 Job: [id number]

## 2010-06-10 NOTE — Op Note (Signed)
NAME:  Carlos Dawson, Carlos Dawson NO.:  [id number]   MEDICAL RECORD NO.:  [id number]                   PATIENT TYPE:  AMB   LOCATION:  ENDO                                 FACILITY:  [facility]   PHYSICIAN:  Danise Edge, M.D.                DATE OF BIRTH:  [birth year]   DATE OF PROCEDURE:  03/06/2002  DATE OF DISCHARGE:                                 OPERATIVE REPORT   PROCEDURE INDICATION:  Mr. Nell Schrack is a 70 year old male, born  1940/03/31.  Mr. Mahon has chronic gastroesophageal reflux.  He is  undergoing esophagogastroduodenoscopy to evaluate a globus type sensation in  his throat and mild odynophagia, localized to the upper esophagus.  Mr.  Fulgham reminds me that I have performed two esophageal dilations in the  past.  Unfortunately, I don't have those records.  I do have a record from  his colonoscopy performed October 01, 2001 which resulted in the removal of  a 2 mm polyp from the cecum.   Mr. Xiang has also seen Dr. Flo Shanks.  When he was on Protonix twice a  day, his heart burn was controlled.  When he was switched from Protonix to  Aciphex once a day, he had breakthrough heart burn.  He is now on Prilosec  once a day and has breakthrough heart burn.   ENDOSCOPIST:  Dr. Danise Edge.   PREMEDICATION:  1. Versed 5 mg.  2. Demerol 50 mg.   ENDOSCOPE:  Olympus gastroscope.   DESCRIPTION OF PROCEDURE:  After obtaining informed consent, Mr. Chizmar was  placed in the left lateral decubitus position.  I administered intravenous  Demerol and intravenous Versed to achieve conscious sedation for the  procedure.  The patient's blood pressure, oxygen saturation and cardiac  rhythm were monitored throughout the procedure and documented in the medical  record.   The Olympus gastroscope was passed through the posterior hypopharynx into  the proximal esophagus without difficulty.  I did not visualize the vocal  cords.   Esophagoscopy:  The proximal, mid and lower segments of the esophagus  appeared completely normal.  There is no esophageal mucosal scarring,  esophageal mucosal stricture formation, esophageal obstruction, erosive  esophagitis, or signs of Barrett's esophagus.   Gastroscopy:  Retroflexed view of the gastric cardia and fundus was normal.  In the mid gastric body, anterior greater curvature aspect, there is a 2 mm  sessile polyp which was removed with the cold biopsy forceps.  There are  scattered erosions in the gastric antrum with a normal appearing pylorus.   Duodenoscopy:  The duodenal bulb and the descending duodenum appeared  normal.   ASSESSMENT:  1. Non-erosive gastroesophageal reflux without Barrett's esophagus and     without esophageal stricture formation.  I suspect Mr. Mcnab symptoms     are due to his gastroesophageal reflux with possibly an associated     esophageal  motility disorder.  2. A small polyp was removed from the gastric body with the cold biopsy     forceps.   RECOMMENDATIONS:  Prilosec 20 mg before breakfast and 20 mg before supper.                                               Danise Edge, M.D.    MJ/MEDQ  D:  03/06/2002  T:  03/06/2002  Job:  [id number]   cc:   Oley Balm. Georgina Pillion, M.D.  [address]  [place]  [place] [address]  Fax: [phone]

## 2010-06-10 NOTE — Op Note (Signed)
[place]  Patient:    Carlos Dawson, Carlos Dawson                     MRN: [id number] Proc. Date: 02/07/00 Adm. Date:  [id number] Attending:  Danella Penton                           Operative Report  PREOPERATIVE DIAGNOSES: 1. Left L4 and L5 radiculopathy secondary to a left 3-4 herniated disc and    foraminal stenosis of L4-5.  Incidental right L5-S1 herniated disc. 2. Obesity.  POSTOPERATIVE DIAGNOSES: 1. Left L4 and L5 radiculopathy secondary to a left 3-4 herniated disc and    foraminal stenosis of L4-5.  Incidental right L5-S1 herniated disc. 2. Obesity.  OPERATION:  Left L3-4 and L4-5 as well as L5-S1 foraminotomy, decompression of the L4, L5 and S1 nerve root.  Microscope.  SURGEON:  Tanya Nones. Jeral Fruit, M.D.  ASSISTANTMena Goes. Franky Macho, M.D.  CLINICAL HISTORY: Carlos Dawson is a gentleman who in the past underwent bilateral L4-5 laminectomy.  The patient had been complaining of back and left leg pain for several weeks which has failed conservative treatment.  MRI showed the possibility of a herniated disc at the level of L3-4 and foraminal stenosis at L4-5.  The patient wanted to go ahead with surgery.  The patient knew about the risks such as infection, CSF leak, worsening of pain, paralysis and need for further surgery.  The worry about Carlos Dawson is that he is quite obese and already the plain x-ray shows there are some changes of degenerative disc disease with the possibility of osteoporosis.  He had been encouraged in the past to lose weight as one way for him to prevent from having repeat surgical procedure.  DESCRIPTION OF PROCEDURE:  The patient was taken to the OR and he was positioned in a prone manner.  Because of the obesity, it was quite difficult to put any sand bags to prevent the increase of abdominal pressure to minimize the pressure.  After the previous scar was resected, we went through the skin, through a deep layer  of fat tissue until we were able to see the tip of the spinous process of L3 that we knew was intact.  Decompression and retraction of the muscle was done on the left side.  X-ray showed that indeed the tip of the probe was at the level of L2-3.  Using the drill, we the drilled the lamina of L3 until we did a hemilaminectomy at that level.  We removed scar tissue at the level of L4-5.  We brought the microscope into the area and we started doing dissection with quite a bit of lysis of adhesions.  At the end, we were able to visualize the L3-L4 interspace.  This gentlemans bone were really soft and quite friable up to the point that he had quite a bit of bleeding from the bone itself.  Hemostasis was done.  Because of the intra-abdominal pressure we had quite a bit of increase of the lumbar venous plexus and we had to do quite a bit of hemostasis.  We visualized the L3-L4 disc and indeed there was not any herniated disc.  What we found was thickening of the ligament and facet hypertrophy.  Foraminotomy was accomplished.  Foraminotomy at the L4-5 and L5-S1 was done.  Repeat x-rays indeed showed that we were in the right area.  Having good decompression and having no need to do any diskectomy, the area was irrigated.  Fentanyl was left in the dural space and the wound was closed with Vicryl and nylon.  The patient did well. DD:  02/08/00 TD:  02/08/00 Job: [id number]

## 2010-06-16 NOTE — Discharge Summary (Signed)
NAME:  Carlos Dawson, Carlos Dawson NO.:  [id number]  MEDICAL RECORD NO.:  [id number]           PATIENT TYPE:  O  LOCATION:  5532                         FACILITY:  [facility]  PHYSICIAN:  [name], M.D.DATE OF BIRTH:  [birth year]  DATE OF ADMISSION:  06/08/2010 DATE OF DISCHARGE:  06/10/2010                              DISCHARGE SUMMARY   DIAGNOSES: 1. Left lower extremity sciatica following neurosurgery last week in     [place] with apparent laminectomy via laser surgery in the lumbar     area.  He has a left laminectomy at L2 and a 3 cm fluid collection     on MRI scanning performed on Jun 08, 2010.  There was some mass     effect on the central canal.  Other changes revealed residual     central canal stenosis with lateral recess narrowing bilaterally at     L2-3 secondary to facet hypertrophy and disk bulging.  There was     also a stable central disk protrusion with lateral recess and     foraminal stenosis at L1-2.  Other findings are as per MRI from Jun 08, 2010. 2. History of cervical degenerative joint disease with cervical fusion     in the past. 3. History of gastric bypass surgery. 4. Hypertension. 5. History of congestive heart failure with echocardiogram performed     in February 2012 with an ejection fraction of 55%. 6. History of obstructive sleep apnea on BiPAP. 7. Morbid obesity. 8. Gastroesophageal reflux disease. 9. History of ascending thoracic aneurysm.  CONSULTATIONS:  Hilda Lias, MD, Jun 10, 2010.  ALLERGIES:  MORPHINE.  DISCHARGE MEDICATIONS: 1. Finasteride 5 mg daily. 2. MiraLax 17 grams in 8 ounces of water or juice daily. 3. Senokot-S 1 p.o. b.i.d. 4. Toradol 10 mg by mouth every 8 hours as needed for up to 5 days. 5. Warfarin 5 mg daily starting on Jun 13, 2010. 6. Amlodipine 5 mg daily. 7. Atenolol 25 mg daily. 8. Flomax 0.4 mg daily at bedtime. 9. Fluticasone nasal spray 50 mcg, 2 sprays in each nostril daily at  bedtime as needed. 10.Lasix 80 mg b.i.d. 11.Potassium chloride 20 mEq b.i.d. 12.Protonix 40 mg daily. 13.Percocet 5/325, 1 p.o. q.4 h. p.r.n. 14.Ranexa 500 mg b.i.d. 15.Valium 5 mg as needed q.4 h. 16.Valturna 150/160 mg 1 p.o. b.i.d. 17.Vitamin B12 over-the-counter daily as taken prior to admission and     vitamin D 2000 units, 2 tablets or gel caps daily.  DISCHARGE LABORATORY DATA: 1. MRI of the lumbar spine performed on Jun 09, 2010 revealed 3 cm     fluid collection at the left laminectomy site at L2 extending into     the epidural service and appeared to create some mass effect on the     central canal.  There was also residual central canal stenosis with     lateral recess narrowing bilaterally at L2-3.  There was stable     central disk protrusion with lateral recess and foraminal stenosis     at L1-2 and there was mild-to-moderate foraminal stenosis  bilaterally at L4-5 and moderate foraminal narrowing at L3-4.     There was also right foraminal stenosis at L5-S1 that was mild. 2. Head CT performed on Jun 08, 2010 revealed a 5-mm hypodensity in     the left putamen.  This is compatible with infarct of indeterminate     age.  Negative for hemorrhage.  DISCHARGE LABORATORIES:  From Jun 10, 2010, white count was 4600, hemoglobin 11.7, platelet count 128,000, and sodium 142, potassium 3.9, chloride 105, bicarb 32, glucose 106, BUN 9, creatinine 0.84, calcium 8.5.  Serial cardiac enzymes while admitted were all within normal limits with normal CK-MB and troponin-I.  HOSPITAL COURSE:  Carlos Dawson is a pleasant 70 year old male with a long history of cervical and lumbar degenerative joint disease.  He has had surgeries in the past in the lumbar spine per Dr. Hilda Lias. Because of persistent radicular symptoms of back pain, Carlos Dawson was operated in [place], [place] last week via a laser surgery procedure at the L2-3 level.  He apparently had a CSF leak of almost 200 mL  after the procedure and was discharged home just 2 days later.  He has had some headache that is actually worse when he is lying down.  He has had some persistent back pain and some left lower extremity radicular pain.  He has 4/5 strength in both lower extremities bilaterally.  His biggest complaint is that of radicular pain.  It has improved with Toradol therapy while admitted.  He was seen in consultation with by Dr. Jeral Fruit who mentioned that he would be happy to help him with his pain syndrome, but he also felt the need for the patient to be back in touch with his surgeons in [facility] for postoperative care for the first 90 days.  He does take Coumadin for history of atrial fibrillation and atrial flutter.  Followed by Dr. Roger Shelter and Dr. Sherryl Manges.  He is also followed by Dr. [name] his ascending thoracic aneurysm.  After a 24-hour admission with treatment with IV Dilaudid and then subsequently with Toradol, pain has improved.  He is able to ambulate without assistance in the hallways using  a rolling walker.  The plans will be to discharge home with Toradol for the next 4-5 days and to resume Coumadin therapy in 48 hours.  He does not have any symptoms to suggest severe neurologic compromise, hopefully he will continue to improve as an outpatient.  We will plan to refer to Pain Management through Dr. Harrell Gave and if he needs acute neurosurgical assistance in the [facility] area over the next 90 days, he will have to see a different neurosurgeon other than Dr. Jeral Fruit.  He was prompted to be in touch with his neurosurgeon in [place] as well after discharge and he will contact either me or Dr. Jeral Fruit for any further pain that is uncontrolled and be seen in consultation in the emergency room for any progressive weakness.  Again, he should also be in consultation with his physicians from [place], [place] as well.  CONDITION ON DISCHARGE:  Improved.  Time spent discussing  discharge with the patient reviewing the chart, performing discharge summary was 35 minutes.     [name], M.D.     RNG/MEDQ  D:  06/10/2010  T:  06/11/2010  Job:  [id number]  Electronically Signed by Marden Noble M.D. on 06/16/2010 06:09:38 PM

## 2010-06-22 NOTE — Consult Note (Signed)
NAME:  Carlos Dawson, FRISTOE NO.:  [id number]  MEDICAL RECORD NO.:  [id number]           PATIENT TYPE:  LOCATION:                                 FACILITY:  PHYSICIAN:  Hilda Lias, M.D.   DATE OF BIRTH:  1940/11/04  DATE OF CONSULTATION:  06/10/2010 DATE OF DISCHARGE:                                CONSULTATION   Carlos Dawson is a 70 year old gentleman, obese, status post gastric bypass with a history of a lumbar laminectomy and cervical fusion.  I have been following him in my office for several years.  Last time he came to my office and we talked about some surgery, we talked about the multiple- level degenerative disk disease and the possibility that he might need decompression and probably fusion.  Nevertheless, unknown to me he ended up in [place], where he went to a back clinic.  He had evaluation last week for 48 hours.  After that, he had surgery last Thursday.  According to him, the surgery lasted 2 hours.  There was like 200 mL of spinal fluid leak and he was discharged like 3 hours after the surgery.  He was followed the following Friday and he was sent home on Saturday because according to the patient they told him he was doing really well.  He developed quite a bit of back pain and left leg pain.  He called the back clinic in [place], and he was given another phone number and the advice was to take more pain medication.  According to Mr. Bordon, they told him that his cure, he has no follow up with them.  Since he came, he started having a lot of pain down to the left leg, discomfort.  He has some tingling sensation in both feet.  There is no evidence of any headache, although he was having some ear pain.  He came through the emergency room.  He had an MRI and he was admitted by the Hospitalist. Right now, he is lying in bed.  He was just finished seeing by physical therapy.  His blood pressure is 160/78 with a pulse of 66 and temperature 97.9.   Mentally, he is oriented.  The lumbar wound incision looks normal.  There is no evidence of any infection.  There is no fluid collection.  Reflexes are symmetrical.  Sensation, he complains of hyperesthesia in the left thigh with the pain going to the left leg. Although he has some restriction secondary to pain with the left leg, I cannot find a weakness.  The temperature in both the legs are normal.  I reviewed the MRI and this had shown subcutaneous fluid collection but there is no evidence of any CSF leak.  He has multiple-level degenerative disk disease with stenosis at the level of L1-L2, stenosis at the level of 2-3, 3-4, 4-5 with facet arthropathy.  CLINICAL IMPRESSION:  Status post left leg radiculopathy.  Surgical procedure in [place], [place], at the level of left L2-L3 eight days ago.  RECOMMENDATIONS:  I talked to Mr. Piper at length.  I told him that I will be more than happy to help  him right now his acute condition, but I do not have any formation whatsoever about what type of surgery he had done at [facility], what kind of tests, anything whatsoever.  I told him that once he is out of the hospital he needs to get hold of his insurance company or the surgeon in [place] in [place] for a followup appointment.  As I mentioned to him, I am more than happy to help him right now in his acute situation, but the problem needs to be corrected by the surgeon who did his procedure last week.          ______________________________ Hilda Lias, M.D.     EB/MEDQ  D:  06/10/2010  T:  06/10/2010  Job:  [id number]  Electronically Signed by Hilda Lias M.D. on 06/22/2010 07:10:01 PM

## 2010-06-23 NOTE — H&P (Signed)
NAME:  Carlos Dawson, Carlos Dawson NO.:  [id number]  MEDICAL RECORD NO.:  [id number]           PATIENT TYPE:  E  LOCATION:  [facility]                         FACILITY:  [facility]  PHYSICIAN:  Lonia Blood, M.D.      DATE OF BIRTH:  01-19-1941  DATE OF ADMISSION:  06/08/2010 DATE OF DISCHARGE:                             HISTORY & PHYSICAL   PRIMARY CARE PHYSICIAN:  [name], MD  PRESENTING COMPLAINT:  Low back pain.  HISTORY OF PRESENT ILLNESS:  The patient is a 70 year old gentleman with known history of degenerative disk disease who has had multiple surgeries in the past that apparently was been operated by Dr. Jeral Fruit. The patient recently went to [place] and had another type of surgery. That surgery was said to be Laser type surgery apparently something to do worldwide new standard surgery.  Per the patient after the surgery, he was told that they touched his spinal cord and there was leakage of fluids.  Since then, he has been having severe low back pain and weakness in the left lower extremity.  He has had pain shooting down his left leg and he has been unable to stay at home quietly.  He has had some tingling sensation as well as tinnitus in his left ear.  All of those was symptom that he was told could happen from the new procedure that they had done.  At the same period, his wife was struggling with breast cancer, so he has not been able to call Dr. Jeral Fruit or his primary care physician until today when thing got out of control, so he came to the ED.  Per the patient, his pain is currently about 10/10, persistent, worsened with any slight movement, and not relieved by anything.  Only slight relief he got was from pain medicine in the ED.  PAST MEDICAL HISTORY:  Significant for: 1. Degenerative disk disease status post multiple surgeries including     lumbar laminectomy and cervical fusion. 2. History of gastric bypass surgery. 3. Hypertension. 4. CHF. 5.  History of obstructive sleep apnea, on BiPAP. 6. Morbid obesity. 7. GERD. 8. History of ascending thoracic aortic aneurysm.  ALLERGIES:  MORPHINE.  CURRENT MEDICATIONS:  Amlodipine, atenolol, finasteride, Klor-Con, Lasix, oxycodone, acetaminophen, Valium, and Valturna.  SOCIAL HISTORY:  The patient is married and lives here in [place]. He denies tobacco, alcohol, or IV drug use.  FAMILY HISTORY:  Denied any significant family history.  REVIEW OF SYSTEMS:  All systems reviewed, currently are negative except per HPI .  PHYSICAL EXAMINATION:  VITAL SIGNS:  His temperature is 98.7, blood pressure 136/77, pulse 60, respiratory rate 18, and sats 95% on room air. GENERAL:  He is awake, alert, and oriented.  He is in no acute distress. HEENT:  PERRL.  EOMI.  No pallor, no jaundice, no rhinorrhea. NECK:  Supple.  No JVD, no lymphadenopathy. RESPIRATORY:  The patient has good air entry bilaterally.  No wheezes, no rales, no crackles. CARDIOVASCULAR:  He has S1 and S2.  No audible murmur. ABDOMEN:  Obese, soft, and nontender with positive bowel sounds. EXTREMITIES:  No edema, cyanosis,  or clubbing. MUSCULOSKELETAL:  He has limitation of extension and lateral rotation of his left lower extremity.  His lower back has surgical scar that is healing.  Also, it is slightly tender to touch, but looks clean. NEUROLOGIC:  Cranial nerves II through XII seem to be intact.  The patient has positive straight leg raising exam on the left.  Reflexes still seem to be 2+ bilaterally.  Normal muscle tone.  LABORATORY DATA:  White count is 5.9, hemoglobin 13.6, and platelet of 152.  Sodium 140, potassium 3.6, chloride 102, CO2 of 29, glucose 108, BUN 13, and creatinine 0.98.  Head CT without contrast showed 5-mm hypodensity in the left putamen compatible with an infarct of indeterminate age, but negative hemorrhage.  ASSESSMENT:  This is a 70 year old gentleman with sciatica status post surgery.   More than likely the patient's surgery is still healing and that is why he is continued to have this pain.  There is no evidence of active infection in the area, so I suspect this is all mainly pain.  The patient wants to have review by Dr. Jeral Fruit and we will admit him for such.  PLAN: 1. Sciatica.  We will admit the patient for pain control.  We will get     Neurosurgery to see him in the morning and reevaluate him in view     of his recent surgery.  We will get PT/OT also involved.  We will     do further care based on neurosurgical recommendations.  We will     consider giving some steroids also to decompress lower back,     especially with a history of recent surgery, but I will defer that     to Neurosurgery later today if they consult on the patient. 2. Hypertension.  Blood pressure is reasonable.  We will get his full     medication list and resume home medicine. 3. CHF.  He seemed to be compensated at this point.  No evidence of     fluid overload.  We will continue to monitor him closely. 4. GERD.  Continue his PPIs. 5. Obstructive sleep apnea.  I will order BiPAP for the patient. 6. History of PE.  The patient has no recent sign of PE.  We will     watch him closely and put him on Lovenox for DVT prophylaxis. 7. Morbid obesity.  Again, he will get PT/OT involved in his care.     Lonia Blood, M.D.     Verlin Grills  D:  06/09/2010  T:  06/09/2010  Job:  [id number]  Electronically Signed by Lonia Blood M.D. on 06/23/2010 11:47:01 AM

## 2010-06-24 ENCOUNTER — Ambulatory Visit (INDEPENDENT_AMBULATORY_CARE_PROVIDER_SITE_OTHER): Payer: 59 | Admitting: *Deleted

## 2010-06-24 DIAGNOSIS — I2699 Other pulmonary embolism without acute cor pulmonale: Secondary | ICD-10-CM

## 2010-06-24 DIAGNOSIS — Z7901 Long term (current) use of anticoagulants: Secondary | ICD-10-CM

## 2010-06-24 LAB — POCT INR: INR: 3

## 2010-06-27 ENCOUNTER — Telehealth: Payer: Self-pay | Admitting: Cardiology

## 2010-06-27 NOTE — Telephone Encounter (Signed)
Called wanting to discuss his Warfin schedule. Please call back.

## 2010-06-28 NOTE — Telephone Encounter (Signed)
Pt called back to clarify his Coumadin dose.  This is noted in the anticoag. sched.

## 2010-07-04 ENCOUNTER — Other Ambulatory Visit: Payer: Self-pay | Admitting: Cardiology

## 2010-07-04 MED ORDER — WARFARIN SODIUM 5 MG PO TABS: ORAL_TABLET | ORAL | Status: DC

## 2010-07-04 NOTE — Telephone Encounter (Signed)
Called in today wanting a prescription refill of Warfin (prescribed at the hospital by Dr. Excell Seltzer) at [facility] on [address] [phone]. Please call back.

## 2010-07-07 ENCOUNTER — Encounter: Payer: 59 | Admitting: *Deleted

## 2010-07-11 ENCOUNTER — Ambulatory Visit: Payer: 59 | Admitting: Cardiology

## 2010-07-15 ENCOUNTER — Ambulatory Visit (INDEPENDENT_AMBULATORY_CARE_PROVIDER_SITE_OTHER): Payer: 59 | Admitting: *Deleted

## 2010-07-15 ENCOUNTER — Encounter: Payer: Self-pay | Admitting: Cardiology

## 2010-07-15 ENCOUNTER — Ambulatory Visit (INDEPENDENT_AMBULATORY_CARE_PROVIDER_SITE_OTHER): Payer: 59 | Admitting: Cardiology

## 2010-07-15 DIAGNOSIS — I712 Thoracic aortic aneurysm, without rupture, unspecified: Secondary | ICD-10-CM

## 2010-07-15 DIAGNOSIS — G4733 Obstructive sleep apnea (adult) (pediatric): Secondary | ICD-10-CM

## 2010-07-15 DIAGNOSIS — Z7901 Long term (current) use of anticoagulants: Secondary | ICD-10-CM

## 2010-07-15 DIAGNOSIS — I2510 Atherosclerotic heart disease of native coronary artery without angina pectoris: Secondary | ICD-10-CM

## 2010-07-15 DIAGNOSIS — I2699 Other pulmonary embolism without acute cor pulmonale: Secondary | ICD-10-CM

## 2010-07-15 DIAGNOSIS — Z86718 Personal history of other venous thrombosis and embolism: Secondary | ICD-10-CM

## 2010-07-15 DIAGNOSIS — R079 Chest pain, unspecified: Secondary | ICD-10-CM

## 2010-07-15 LAB — POCT INR: INR: 3.5

## 2010-07-15 NOTE — Patient Instructions (Signed)
Work on your weight loss.  You need to exercise daily.

## 2010-07-16 DIAGNOSIS — R079 Chest pain, unspecified: Secondary | ICD-10-CM | POA: Insufficient documentation

## 2010-07-16 NOTE — Assessment & Plan Note (Signed)
Last measured diameter was 50 mm. He will continue with serial followup CT scans per Dr. [name].

## 2010-07-16 NOTE — Assessment & Plan Note (Signed)
Continue risk factor modification 

## 2010-07-16 NOTE — Assessment & Plan Note (Signed)
He is on home BiPAP therapy and is followed by Dr. Craige Cotta in pulmonary.

## 2010-07-16 NOTE — Assessment & Plan Note (Signed)
Clinically responsive to Ranexa. We will continue with this therapy.

## 2010-07-16 NOTE — Progress Notes (Signed)
Carlos Dawson Date of Birth: December 11, 1940   History of Present Illness: Carlos Dawson is a 70 year old white male who I am seeing for the first time to assume care from Dr. Deborah Chalk. He has a complex medical history. He has a history of morbid obesity with obstructive sleep apnea. He is on home BiPAP therapy. He has a history of pulmonary emboli and has been on chronic Coumadin therapy. He has nonobstructive coronary disease by cardiac catheterization in 2006. He has a history of thoracic aortic aneurysm measuring 50 mm. This is followed by Dr. [name]. He states the only episode of chest pain he has had recently lasted only a few seconds. He does have spells where he becomes very short of breath. He had a nuclear stress test in June of 2011 which was normal. He has had prior lap band surgery for his morbid obesity and is planning to have this adjusted.  Current Outpatient Prescriptions on File Prior to Visit  Medication Sig Dispense Refill  . Aliskiren-Valsartan (VALTURNA) 150-160 MG TABS Take 150-160 mg/day by mouth 2 (two) times daily.        Marland Kitchen amLODipine (NORVASC) 5 MG tablet Take 5 mg by mouth daily.        Marland Kitchen atenolol (TENORMIN) 25 MG tablet Take 25 mg by mouth daily.        . finasteride (PROSCAR) 5 MG tablet Take 5 mg by mouth daily.        . furosemide (LASIX) 80 MG tablet Take 80 mg by mouth 2 (two) times daily.        . pantoprazole (PROTONIX) 40 MG tablet TAKE 1 TABLET BY MOUTH EVERY DAY  30 tablet  4  . potassium chloride SA (K-DUR,KLOR-CON) 20 MEQ tablet Take 20 mEq by mouth 2 (two) times daily.        . predniSONE (DELTASONE) 20 MG tablet Take 20 mg by mouth daily. 20 mg for 5 days, 10mg  for next 5 days       . ranolazine (RANEXA) 500 MG 12 hr tablet Take 500 mg by mouth 2 (two) times daily.        . Tamsulosin HCl (FLOMAX) 0.4 MG CAPS Take 0.4 mg by mouth daily.        . traMADol (ULTRAM) 50 MG tablet Take 50 mg by mouth every 6 (six) hours as needed.        Marland Kitchen VITAMIN D,  CHOLECALCIFEROL, PO Take 4,000 mg/day by mouth daily.        Marland Kitchen warfarin (COUMADIN) 5 MG tablet 1 tablet po four days a week, 1 1/2 tablet the other three days, or as directed.  60 tablet  6    Allergies  Allergen Reactions  . Morphine     REACTION: itch    Past Medical History  Diagnosis Date  . Hypertension   . Obesities, morbid   . Pulmonary embolism   . Anticoagulant long-term use   . CAD (coronary artery disease)     s/p cath 2006 with minimal disease  . Thoracic aortic aneurysm     followed by Dr. [name]  . OA (osteoarthritis)   . LVH (left ventricular hypertrophy)   . Paroxysmal atrial fibrillation   . Colonic polyp   . Sleep apnea     Past Surgical History  Procedure Date  . Hemorroidectomy   . Appendectomy   . Vasectomy   . Cervical spine surgery   . Gastric bypass     2009  History  Smoking status  . Former Smoker  Smokeless tobacco  . Not on file  Comment: Stopped in 1983    History  Alcohol Use No    Family History  Problem Relation Age of Onset  . Heart disease Mother   . Emphysema Father 67  . Heart attack Sister     Review of Systems: The review of systems is positive for sudden hearing loss in his left ear. He has been treated with steroids with resultant weight gain. He underwent complex back surgery in [place] earlier this year and apparently there was some injury to the dura. This resulted in headaches. He reports his blood pressure at home has been under good control. All other systems were reviewed and are negative.  Physical Exam: BP 124/72  Pulse 65  Ht 5\' 7"  (1.702 m)  Wt 287 lb 9.6 oz (130.455 kg)  BMI 45.04 kg/m2 He is a morbidly obese white male in no acute distress. Normocephalic, atraumatic. Pupils are equal round and reactive to light and accommodation. Sclera clear. Oropharynx is clear. Neck is supple without JVD, adenopathy, thyromegaly, or bruits. Lungs are clear. Cardiac exam reveals a regular rate and rhythm he has a  soft outflow murmur. Abdomen is morbidly obese, soft, nontender. He has no masses. Extremities are without edema. Pedal pulses are palpable. He is alert and oriented x3. Cranial nerves II through XII are intact. LABORATORY DATA: ECG today is normal. INR is 3.5.  Assessment / Plan:

## 2010-07-16 NOTE — Assessment & Plan Note (Signed)
His INR is supratherapeutic today. His dose will be adjusted down and we will recheck again.

## 2010-07-18 ENCOUNTER — Encounter: Payer: Self-pay | Admitting: Cardiology

## 2010-07-22 ENCOUNTER — Encounter: Payer: 59 | Admitting: *Deleted

## 2010-07-25 ENCOUNTER — Ambulatory Visit (INDEPENDENT_AMBULATORY_CARE_PROVIDER_SITE_OTHER): Payer: 59 | Admitting: *Deleted

## 2010-07-25 DIAGNOSIS — I2699 Other pulmonary embolism without acute cor pulmonale: Secondary | ICD-10-CM

## 2010-07-25 DIAGNOSIS — Z7901 Long term (current) use of anticoagulants: Secondary | ICD-10-CM

## 2010-07-25 LAB — POCT INR: INR: 2.3

## 2010-08-10 ENCOUNTER — Other Ambulatory Visit: Payer: Self-pay | Admitting: Cardiothoracic Surgery

## 2010-08-10 DIAGNOSIS — I712 Thoracic aortic aneurysm, without rupture, unspecified: Secondary | ICD-10-CM

## 2010-08-16 ENCOUNTER — Encounter: Payer: Self-pay | Admitting: Pulmonary Disease

## 2010-08-17 ENCOUNTER — Telehealth: Payer: Self-pay | Admitting: Cardiology

## 2010-08-17 DIAGNOSIS — I119 Hypertensive heart disease without heart failure: Secondary | ICD-10-CM

## 2010-08-17 MED ORDER — VALSARTAN 160 MG PO TABS: 160.0000 mg | ORAL_TABLET | Freq: Every day | ORAL | Status: DC

## 2010-08-17 MED ORDER — AMLODIPINE BESYLATE 5 MG PO TABS: 5.0000 mg | ORAL_TABLET | Freq: Two times a day (BID) | ORAL | Status: DC

## 2010-08-17 NOTE — Telephone Encounter (Signed)
Received fax from pharmacy as well as a call from patient.  Valturna 150/160 mg longer available from manufacturer.  Will change to Valsartan 160 mg twice daily and increase his Norvasc to 5 mg twice daily. Advised patient, will monitor blood pressure at home and call back if poorly controlled.

## 2010-08-17 NOTE — Telephone Encounter (Signed)
Agree with plan 

## 2010-08-17 NOTE — Telephone Encounter (Signed)
Called because he had a question about Gemma Payor being taken off the market and what he needs to take to replace it. Please call back. I could not find the chart.

## 2010-08-18 ENCOUNTER — Ambulatory Visit (INDEPENDENT_AMBULATORY_CARE_PROVIDER_SITE_OTHER): Payer: 59 | Admitting: Pulmonary Disease

## 2010-08-18 ENCOUNTER — Encounter: Payer: Self-pay | Admitting: Pulmonary Disease

## 2010-08-18 DIAGNOSIS — G4733 Obstructive sleep apnea (adult) (pediatric): Secondary | ICD-10-CM

## 2010-08-18 DIAGNOSIS — R0602 Shortness of breath: Secondary | ICD-10-CM

## 2010-08-18 MED ORDER — ALBUTEROL SULFATE HFA 108 (90 BASE) MCG/ACT IN AERS: 2.0000 | INHALATION_SPRAY | Freq: Four times a day (QID) | RESPIRATORY_TRACT | Status: DC | PRN

## 2010-08-18 NOTE — Patient Instructions (Signed)
Follow-up in one year.

## 2010-08-18 NOTE — Assessment & Plan Note (Signed)
He is doing well on BPAP.  He is compliant and feels benefit from therapy.

## 2010-08-18 NOTE — Progress Notes (Signed)
Subjective:    Patient ID: Carlos Dawson, male    DOB: 01-30-1940, 70 y.o.   MRN: [id number]  HPI CC: Peter [name], Edward Gerhardt  70 yo male f/u for OSA on BPAP 13/9, and mild/intermittent asthma.  He is doing well with BPAP.  He uses a full face mask.  He sleeps for 7 hours per night, and uses his BPAP for the whole night.  He is not having any trouble with snoring.  He has good energy during the day.  He has noticed occasional wheezing with allergy exposures.  He has used inhalers in the past that helped him get through the season.  Past Medical History  Diagnosis Date  . Hypertension   . Obesities, morbid   . Pulmonary embolism   . Anticoagulant long-term use   . CAD (coronary artery disease)     s/p cath 2006 with minimal disease  . Thoracic aortic aneurysm     followed by Dr. [name]  . OA (osteoarthritis)   . LVH (left ventricular hypertrophy)   . Paroxysmal atrial fibrillation   . Colonic polyp   . OSA (obstructive sleep apnea)     PSG 03/30/97 AHI 21, BPAP 13/9  . Mild intermittent asthma     Review of Systems     Objective:   Physical Exam  BP 130/78  Pulse 63  Temp(Src) 98 F (36.7 C) (Oral)  Ht 5\' 7"  (1.702 m)  Wt 291 lb (131.997 kg)  BMI 45.58 kg/m2  SpO2 94%  General: obese.  Nose: clear, non tender  Mouth: MP 3, no oral lesion, triangular uvula  Neck: no JVD.  Lungs: clear bilaterally to auscultation and percussion  Heart: regular rate and rhythm, S1, S2 without murmurs, rubs, gallops, or clicks  Extremities: no clubbing, cyanosis, edema, or deformity noted  Neurologic: normal CN II-XII and strength normal.  Cervical Nodes: no significant adenopathy  Psych: alert and cooperative; normal mood and affect; normal attention span and concentration     Assessment & Plan:   OBSTRUCTIVE SLEEP APNEA He is doing well on BPAP.  He is compliant and feels benefit from therapy.  Mild intermittent asthma He has intermittent symptoms of wheezing  mostly in Spring and Summer.  He had good response to inhaler therapy.  Will refill prn albuterol.    Updated Medication List Outpatient Encounter Prescriptions as of 08/18/2010  Medication Sig Dispense Refill  . amLODipine (NORVASC) 5 MG tablet Take 1 tablet (5 mg total) by mouth 2 (two) times daily.  60 tablet  11  . atenolol (TENORMIN) 25 MG tablet Take 25 mg by mouth daily.        . finasteride (PROSCAR) 5 MG tablet Take 5 mg by mouth daily.        . fluticasone (FLONASE) 50 MCG/ACT nasal spray Place 2 sprays into the nose daily.       . furosemide (LASIX) 80 MG tablet Take 80 mg by mouth 2 (two) times daily.        . pantoprazole (PROTONIX) 40 MG tablet TAKE 1 TABLET BY MOUTH EVERY DAY  30 tablet  4  . potassium chloride SA (K-DUR,KLOR-CON) 20 MEQ tablet Take 20 mEq by mouth 2 (two) times daily.       . ranolazine (RANEXA) 500 MG 12 hr tablet Take 500 mg by mouth 2 (two) times daily.        . Tamsulosin HCl (FLOMAX) 0.4 MG CAPS Take 0.4 mg by mouth daily.        Marland Kitchen  traMADol (ULTRAM) 50 MG tablet Take 50 mg by mouth every 6 (six) hours as needed.        . valsartan (DIOVAN) 160 MG tablet Take 160 mg by mouth 2 (two) times daily.        Marland Kitchen VITAMIN D, CHOLECALCIFEROL, PO Take 4,000 mg/day by mouth daily.        Marland Kitchen warfarin (COUMADIN) 5 MG tablet Take as directed       . DISCONTD: warfarin (COUMADIN) 5 MG tablet 1 tablet po four days a week, 1 1/2 tablet the other three days, or as directed.  60 tablet  6  . albuterol (VENTOLIN HFA) 108 (90 BASE) MCG/ACT inhaler Inhale 2 puffs into the lungs every 6 (six) hours as needed for wheezing.  1 Inhaler  2  . DISCONTD: predniSONE (DELTASONE) 20 MG tablet Take 20 mg by mouth daily. 20 mg for 5 days, 10mg  for next 5 days

## 2010-08-18 NOTE — Assessment & Plan Note (Signed)
He has intermittent symptoms of wheezing mostly in Spring and Summer.  He had good response to inhaler therapy.  Will refill prn albuterol.

## 2010-08-19 ENCOUNTER — Ambulatory Visit (INDEPENDENT_AMBULATORY_CARE_PROVIDER_SITE_OTHER): Payer: 59 | Admitting: Surgery

## 2010-08-19 ENCOUNTER — Encounter (INDEPENDENT_AMBULATORY_CARE_PROVIDER_SITE_OTHER): Payer: Self-pay | Admitting: Surgery

## 2010-08-19 VITALS — BP 136/88 | Wt 287.6 lb

## 2010-08-19 DIAGNOSIS — Z9884 Bariatric surgery status: Secondary | ICD-10-CM

## 2010-08-19 DIAGNOSIS — Z4651 Encounter for fitting and adjustment of gastric lap band: Secondary | ICD-10-CM

## 2010-08-19 NOTE — Progress Notes (Signed)
Carlos Dawson came in today for followup in LAP-BAND adjustment. We saw again the last in January he was having some reflux issues and pain with taking potassium tablets. He has many medications he takes and is on Coumadin for atrial fibrillation. I think this is frustrated his weight loss which currently is only about 22 pounds since his surgery done in May of 09. I went ahead and added 0.25 cc to his band today. He was able to drink fluids after that. I will see him back in 6 weeks.  He is eating some carbs think that this is undermining his weight loss. I asked him to stop eating mac and cheese and potatoes.  Plan: Return 6 weeks

## 2010-08-22 ENCOUNTER — Encounter: Payer: 59 | Admitting: *Deleted

## 2010-08-24 ENCOUNTER — Ambulatory Visit (INDEPENDENT_AMBULATORY_CARE_PROVIDER_SITE_OTHER): Payer: 59 | Admitting: *Deleted

## 2010-08-24 DIAGNOSIS — I2699 Other pulmonary embolism without acute cor pulmonale: Secondary | ICD-10-CM

## 2010-08-24 DIAGNOSIS — Z7901 Long term (current) use of anticoagulants: Secondary | ICD-10-CM

## 2010-08-24 LAB — POCT INR: INR: 2.5

## 2010-08-26 ENCOUNTER — Ambulatory Visit: Payer: 59 | Admitting: Nurse Practitioner

## 2010-09-08 ENCOUNTER — Encounter (INDEPENDENT_AMBULATORY_CARE_PROVIDER_SITE_OTHER): Payer: 59 | Admitting: Cardiothoracic Surgery

## 2010-09-08 ENCOUNTER — Encounter: Payer: 59 | Admitting: Cardiothoracic Surgery

## 2010-09-08 ENCOUNTER — Ambulatory Visit
Admission: RE | Admit: 2010-09-08 | Discharge: 2010-09-08 | Disposition: A | Discharge status: Home / Self Care | Payer: 59 | Source: Ambulatory Visit | Attending: Cardiothoracic Surgery | Admitting: Cardiothoracic Surgery

## 2010-09-08 DIAGNOSIS — I712 Thoracic aortic aneurysm, without rupture: Secondary | ICD-10-CM

## 2010-09-08 MED ORDER — IOHEXOL 350 MG/ML SOLN
100.0000 mL | Freq: Once | INTRAVENOUS | Status: AC | PRN
  Administered 2010-09-08: 100 mL via INTRAVENOUS

## 2010-09-08 NOTE — Assessment & Plan Note (Addendum)
OFFICE VISIT  Carlos Dawson, Carlos Dawson DOB:  06-Sep-1940                                        September 08, 2010 CHART #:  [id number]  The patient is a patient that has been followed here since May 2011 when he presented with incidental finding of a dilated ascending aorta that has been noted at least since 2009.  The patient returns today for followup CT scan.  He has no specific symptoms related to his aorta, but does complain of significant increasing dyspnea on exertion, substernal discomfort with exertion, problem with sleeping at night, waking up short of breath that has been gradually worsening since he saw Dr. [name], 2 months ago.  He continues on potassium 60 mEq b.i.d., Lasix 80 b.i.d., atenolol 25 a day, Flomax and Norvasc twice a day, finasteride 5 a day, he is now on Diovan 160 p.o. b.i.d. and Protonix.  Coumadin as monitored by [facility].  On physical exam, his blood pressure in the right upper arm is 65/35 in the left upper arm 124/80, pulse is 66, respiratory rate is 20, O2 sats 98% on room air, his temperature is 97.6.  On exam, the patient has a markedly elevated BMI and weight of 263 pounds.  His breath sounds are distant but without wheezing.  Cardiac exam reveals a regular rate and rhythm.  He has been in atrial fibrillation in the past and on Coumadin for this, but currently does not appear to be in atrial fibrillation.  I do not appreciate any murmur of aortic insufficiency.  He has mild-to- moderate pedal edema.  Followup CT scan shows essentially unchanged ascending aorta since of the scan a year ago at just 4.95 cm.  At this point, the patients increasing symptoms of angina and/or congestive heart failure are seemed to be worsening, would not recommend proceeding with elective replacement of his ascending aorta.  At this point especially without significant enlargement of his ascending aorta and having not reached 5.5  cm.  This has been discussed with the patient.  We have called Dr. Elvis Coil office today to make a followup appointment with Dr. [name], for him.  Sheliah Plane, MD Electronically Signed  EG/MEDQ  D:  09/08/2010  T:  09/08/2010  Job:  [id number]  cc:   [name], M.D. [name]. [name], M.D.

## 2010-09-12 ENCOUNTER — Ambulatory Visit: Payer: 59 | Admitting: Nurse Practitioner

## 2010-09-12 ENCOUNTER — Other Ambulatory Visit: Payer: Self-pay | Admitting: *Deleted

## 2010-09-12 MED ORDER — PANTOPRAZOLE SODIUM 40 MG PO TBEC: 40.0000 mg | DELAYED_RELEASE_TABLET | Freq: Every day | ORAL | Status: DC

## 2010-09-12 NOTE — Telephone Encounter (Signed)
Fax received from pharmacy. Refill completed. Jodette Mahaila Tischer RN  

## 2010-09-15 ENCOUNTER — Encounter: Payer: Self-pay | Admitting: Nurse Practitioner

## 2010-09-15 ENCOUNTER — Ambulatory Visit (INDEPENDENT_AMBULATORY_CARE_PROVIDER_SITE_OTHER): Payer: 59 | Admitting: Nurse Practitioner

## 2010-09-15 ENCOUNTER — Encounter: Payer: Self-pay | Admitting: *Deleted

## 2010-09-15 ENCOUNTER — Ambulatory Visit (INDEPENDENT_AMBULATORY_CARE_PROVIDER_SITE_OTHER): Payer: Self-pay | Admitting: Internal Medicine

## 2010-09-15 DIAGNOSIS — I2699 Other pulmonary embolism without acute cor pulmonale: Secondary | ICD-10-CM

## 2010-09-15 DIAGNOSIS — I2510 Atherosclerotic heart disease of native coronary artery without angina pectoris: Secondary | ICD-10-CM

## 2010-09-15 DIAGNOSIS — R0600 Dyspnea, unspecified: Secondary | ICD-10-CM

## 2010-09-15 DIAGNOSIS — R0989 Other specified symptoms and signs involving the circulatory and respiratory systems: Secondary | ICD-10-CM

## 2010-09-15 DIAGNOSIS — R0609 Other forms of dyspnea: Secondary | ICD-10-CM

## 2010-09-15 DIAGNOSIS — R079 Chest pain, unspecified: Secondary | ICD-10-CM

## 2010-09-15 LAB — BASIC METABOLIC PANEL WITH GFR
BUN: 15 mg/dL (ref 6–23)
CO2: 29 meq/L (ref 19–32)
Calcium: 8.5 mg/dL (ref 8.4–10.5)
Chloride: 104 meq/L (ref 96–112)
Creatinine, Ser: 0.8 mg/dL (ref 0.4–1.5)
GFR: 97.65 mL/min
Glucose, Bld: 104 mg/dL — ABNORMAL HIGH (ref 70–99)
Potassium: 4 meq/L (ref 3.5–5.1)
Sodium: 139 meq/L (ref 135–145)

## 2010-09-15 LAB — CBC WITH DIFFERENTIAL/PLATELET
Basophils Absolute: 0 10*3/uL (ref 0.0–0.1)
Basophils Relative: 0.5 % (ref 0.0–3.0)
Eosinophils Absolute: 0.2 10*3/uL (ref 0.0–0.7)
Eosinophils Relative: 2.6 % (ref 0.0–5.0)
HCT: 39.9 % (ref 39.0–52.0)
Hemoglobin: 13.6 g/dL (ref 13.0–17.0)
Lymphocytes Relative: 26.9 % (ref 12.0–46.0)
Lymphs Abs: 1.6 10*3/uL (ref 0.7–4.0)
MCHC: 34 g/dL (ref 30.0–36.0)
MCV: 89.4 fl (ref 78.0–100.0)
Monocytes Absolute: 0.5 10*3/uL (ref 0.1–1.0)
Monocytes Relative: 8.8 % (ref 3.0–12.0)
Neutro Abs: 3.6 10*3/uL (ref 1.4–7.7)
Neutrophils Relative %: 61.2 % (ref 43.0–77.0)
Platelets: 142 10*3/uL — ABNORMAL LOW (ref 150.0–400.0)
RBC: 4.46 Mil/uL (ref 4.22–5.81)
RDW: 13.9 % (ref 11.5–14.6)
WBC: 5.8 10*3/uL (ref 4.5–10.5)

## 2010-09-15 LAB — BRAIN NATRIURETIC PEPTIDE: Pro B Natriuretic peptide (BNP): 38 pg/mL (ref 0.0–100.0)

## 2010-09-15 LAB — POCT INR: INR: 3

## 2010-09-15 MED ORDER — FUROSEMIDE 80 MG PO TABS: ORAL_TABLET | ORAL | Status: DC

## 2010-09-15 MED ORDER — RANOLAZINE ER 1000 MG PO TB12: 1000.0000 mg | ORAL_TABLET | Freq: Two times a day (BID) | ORAL | Status: DC

## 2010-09-15 NOTE — Assessment & Plan Note (Signed)
He remains on coumadin anticoagulation. INR is 3.0 today.

## 2010-09-15 NOTE — Progress Notes (Signed)
Carlos Dawson Date of Birth: May 12, 1940   History of Present Illness: Carlos Dawson is seen today for a work in visit. He is seen for Dr. [name] and at the request of Dr. [name]. He has multiple and complex issues which include nonobstructive CAD, known thoracic aneurysm and morbid obesity. He remains on chronic coumadin for history of pulmonary emboli. He saw Dr. [name] to establish cardiology follow up since Dr. Deborah Chalk retired back in June. He seemed to be doing ok at that visit. He saw Dr. [name] last week and was complaining of chest pain and shortness of breath. He says he cannot go up stairs without getting totally out of breath and feeling like his chest was going to explode. No real symptoms at rest. He is not able to exercise due to chronic back pain. He remains morbidly obese and has gotten his lap band tightened to no avail. He is having more swelling. He notes less response to his diuretic therapy. He is frustrated. His last nuclear study was in June of 2011 and last echo was in September of 2011 with remote cath in 2006. He had very minimal disease.   Current Outpatient Prescriptions on File Prior to Visit  Medication Sig Dispense Refill  . albuterol (VENTOLIN HFA) 108 (90 BASE) MCG/ACT inhaler Inhale 2 puffs into the lungs every 6 (six) hours as needed for wheezing.  1 Inhaler  2  . amLODipine (NORVASC) 5 MG tablet Take 1 tablet (5 mg total) by mouth 2 (two) times daily.  60 tablet  11  . atenolol (TENORMIN) 25 MG tablet Take 25 mg by mouth daily.        . finasteride (PROSCAR) 5 MG tablet Take 5 mg by mouth daily.        . fluticasone (FLONASE) 50 MCG/ACT nasal spray Place 2 sprays into the nose as needed.       . pantoprazole (PROTONIX) 40 MG tablet Take 1 tablet (40 mg total) by mouth daily.  30 tablet  5  . potassium chloride SA (K-DUR,KLOR-CON) 20 MEQ tablet Take 20 mEq by mouth 2 (two) times daily.       . Tamsulosin HCl (FLOMAX) 0.4 MG CAPS Take 0.4 mg by mouth daily.          . traMADol (ULTRAM) 50 MG tablet Take 50 mg by mouth every 6 (six) hours as needed.        . valsartan (DIOVAN) 160 MG tablet Take 160 mg by mouth 2 (two) times daily.        Marland Kitchen VITAMIN D, CHOLECALCIFEROL, PO Take 4,000 mg/day by mouth daily.        Marland Kitchen warfarin (COUMADIN) 5 MG tablet 5 mg five days a week and 7 and 1/2 two days a week      . DISCONTD: furosemide (LASIX) 80 MG tablet Take 80 mg by mouth 2 (two) times daily.        Marland Kitchen DISCONTD: ranolazine (RANEXA) 500 MG 12 hr tablet Take 500 mg by mouth 2 (two) times daily.        Marland Kitchen DISCONTD: valsartan (DIOVAN) 160 MG tablet Take 1 tablet (160 mg total) by mouth daily.  60 tablet  11    Allergies  Allergen Reactions  . Adhesive (Tape)   . Morphine     REACTION: itch    Past Medical History  Diagnosis Date  . Hypertension   . Obesities, morbid   . Pulmonary embolism   . Anticoagulant long-term use   .  CAD (coronary artery disease)     s/p cath 2006 with minimal disease  . Thoracic aortic aneurysm     followed by Dr. [name]  . OA (osteoarthritis)   . LVH (left ventricular hypertrophy)   . Paroxysmal atrial fibrillation   . Colonic polyp   . OSA (obstructive sleep apnea)     PSG 03/30/97 AHI 21, BPAP 13/9  . Mild intermittent asthma   . Hemorrhoids   . Generalized headaches   . SOB (shortness of breath)   . Diastolic dysfunction   . ASCVD (arteriosclerotic cardiovascular disease)     post cath   . Sleep apnea   . GERD (gastroesophageal reflux disease)   . IBS (irritable bowel syndrome)   . Aortic root enlargement   . Ascending aortic aneurysm     recent scan showing no change  . PAF (paroxysmal atrial fibrillation)     treated with Coumadin    Past Surgical History  Procedure Date  . Hemorroidectomy   . Appendectomy   . Vasectomy   . Cervical spine surgery 06/02/2010    lower back and neck  . Gastric bypass     2009  . Cholecystectomy   . Rotator cuff repair     both  . Achilles tendon repair   . Cardiac  catheterization 2006    History  Smoking status  . Former Smoker -- 1.5 packs/day for 30 years  . Quit date: 01/23/1994  Smokeless tobacco  . Not on file  Comment: Stopped in 1983    History  Alcohol Use No    Family History  Problem Relation Age of Onset  . Heart disease Mother   . Emphysema Father 77  . Heart attack Sister     Review of Systems: The review of systems is positive for worsening edema, chest pain and shortness of breath.  All other systems were reviewed and are negative.  Physical Exam: BP 140/80  Ht 5\' 7"  (1.702 m)  Wt 289 lb 12.8 oz (131.452 kg)  BMI 45.39 kg/m2 Patient is pleasant and in no acute distress. He is obese.  Skin is warm and dry. Color is normal.  HEENT is unremarkable. Normocephalic/atraumatic. PERRL. Sclera are nonicteric. Neck is supple. No masses. No JVD. Lungs are clear. Cardiac exam shows a regular rate and rhythm.  Very soft outflow murmur noted. Abdomen is obese and soft. Extremities are with 1 to 2+ edema bilaterally. Gait and ROM are intact. No gross neurologic deficits noted.   LABORATORY DATA:  EKG shows sinus brady.   Assessment / Plan:

## 2010-09-15 NOTE — Assessment & Plan Note (Signed)
This is probably the crux of his problems. He is not able to exercise. He has had his lap band adjusted recently.

## 2010-09-15 NOTE — Patient Instructions (Signed)
We are going to check your labs today We are going to arrange for a repeat stress test  Increase your Ranexan to 1000 mg two times a day Increase your Lasix to 1 1/2 tablets (120 mg) two times a day I will see you back in about 3 weeks.

## 2010-09-15 NOTE — Assessment & Plan Note (Signed)
He presents with worsening chest pain and shortness of breath. I suspect that most of this is weight related and due to deconditioning. We will go ahead and update the nuclear study. Labs are checked today to include BMET and BNP. I have increased his Lasix and Ranexa today. I will see him back in about 3 weeks after his studies are complete. Patient is agreeable to this plan and will call if any problems develop in the interim.

## 2010-09-16 NOTE — Progress Notes (Signed)
lm

## 2010-09-19 ENCOUNTER — Telehealth: Payer: Self-pay | Admitting: *Deleted

## 2010-09-19 ENCOUNTER — Encounter: Payer: 59 | Admitting: *Deleted

## 2010-09-19 NOTE — Telephone Encounter (Signed)
Notified of lab results. Will send copy to Dr. Kevan Ny

## 2010-09-19 NOTE — Telephone Encounter (Signed)
Message copied by Lorayne Bender on Mon Sep 19, 2010 11:26 AM ------      Message from: Rosalio Macadamia      Created: Fri Sep 16, 2010  7:46 AM       Ok to report. Labs are satisfactory. BNP is low. We will see how he does with med changes and see what the stress test shows.

## 2010-09-21 ENCOUNTER — Encounter: Payer: 59 | Admitting: *Deleted

## 2010-09-23 ENCOUNTER — Encounter: Payer: Self-pay | Admitting: *Deleted

## 2010-09-24 HISTORY — PX: CORONARY STENT PLACEMENT: SHX1402

## 2010-09-28 ENCOUNTER — Ambulatory Visit (HOSPITAL_COMMUNITY): Payer: 59 | Attending: Nurse Practitioner | Admitting: Radiology

## 2010-09-28 DIAGNOSIS — E119 Type 2 diabetes mellitus without complications: Secondary | ICD-10-CM

## 2010-09-28 DIAGNOSIS — I4891 Unspecified atrial fibrillation: Secondary | ICD-10-CM

## 2010-09-28 DIAGNOSIS — R0602 Shortness of breath: Secondary | ICD-10-CM

## 2010-09-28 DIAGNOSIS — R079 Chest pain, unspecified: Secondary | ICD-10-CM | POA: Insufficient documentation

## 2010-09-28 MED ORDER — REGADENOSON 0.4 MG/5ML IV SOLN
0.4000 mg | Freq: Once | INTRAVENOUS | Status: AC
  Administered 2010-09-28: 0.4 mg via INTRAVENOUS

## 2010-09-28 MED ORDER — TECHNETIUM TC 99M TETROFOSMIN IV KIT
33.0000 | PACK | Freq: Once | INTRAVENOUS | Status: AC | PRN
  Administered 2010-09-28: 33 via INTRAVENOUS

## 2010-09-28 NOTE — Progress Notes (Signed)
[facility] SITE 3 NUCLEAR MED [address] [place] [address] [phone]  Cardiology Nuclear Med Study  Carlos Dawson is a 70 y.o. male [id number] 12-31-1940   Nuclear Med Background Indication for Stress Test:  Evaluation for Ischemia History:  Asthma, 02/12 Echo: EF 55%, '06 Heart Catheterization: minimal DZ and 07/19/09 Myocardial Perfusion Study: EF 77% NL Cardiac Risk Factors: Family History - CAD, History of Smoking, Hypertension and Obesity  Symptoms:  Chest Pain and DOE   Nuclear Pre-Procedure Caffeine/Decaff Intake:  None NPO After: 7:00pm   Lungs:  clear IV 0.9% NS with Angio Cath:  22g  IV Site: R Hand  IV Started by:  Stanton Kidney, EMT-P  Chest Size (in):  54 Cup Size: n/a  Height: 5\' 7"  (1.702 m)  Weight:  288 lb (130.636 kg)  BMI:  Body mass index is 45.11 kg/(m^2). Tech Comments:  Held atenolol this am, per patient.    Nuclear Med Study 1 or 2 day study: 2 day  Stress Test Type:  Eugenie Birks  Reading MD: Charlton Haws MD  Order Authorizing Provider:  P.Jordan/L.Gerhardt NP  Resting Radionuclide: Technetium 91m Tetrofosmin  Resting Radionuclide Dose: 33 mCi   Stress Radionuclide:  Technetium 74m Tetrofosmin  Stress Radionuclide Dose: 33 mCi           Stress Protocol Rest HR: 76 Stress HR: 82  Rest BP: 122/62 Stress BP: 139/65  Exercise Time (min): n/a METS: n/a   Predicted Max HR: 152 bpm % Max HR: 53.95 bpm Rate Pressure Product: 46962   Dose of Adenosine (mg):  n/a Dose of Lexiscan: 0.4 mg  Dose of Atropine (mg): n/a Dose of Dobutamine: n/a mcg/kg/min (at max HR)  Stress Test Technologist: Milana Na, EMT-P  Nuclear Technologist:  Domenic Polite, CNMT     Rest Procedure:  Myocardial perfusion imaging was performed at rest 45 minutes following the intravenous administration of Technetium 92m Tetrofosmin. Rest ECG: Atrial Fibrilliation  Stress Procedure:  The patient received IV Lexiscan 0.4 mg over 15-seconds.   Technetium 28m Tetrofosmin injected at 30-seconds.  There were no significant changes, pvcs (short run vtach) with Lexiscan.  Quantitative spect images were obtained after a 45 minute delay. Stress ECG: Short run of idioventricular rhythm and PVC;s  QPS Raw Data Images:  Normal; no motion artifact; normal heart/lung ratio. Stress Images:  There is decreased uptake in the inferior wall. Rest Images:  There is decreased uptake in the inferior wall. Subtraction (SDS):  There is a fixed defect that is most consistent with a previous infarction. Transient Ischemic Dilatation (Normal <1.22):  .99 Lung/Heart Ratio (Normal <0.45):  .36  Quantitative Gated Spect Images QGS EDV:  131 ml QGS ESV:  36 ml QGS cine images:  Normal Wall Motion QGS EF: 73%  Impression Exercise Capacity:  Lexiscan with no exercise. BP Response:  Normal blood pressure response. Clinical Symptoms:  Mild chest pain/dyspnea. ECG Impression:  No significant ST segment change suggestive of ischemia. Comparison with Prior Nuclear Study: No images to compare  Overall Impression:  Small inferior wall infarct at mid and basal level with mild peri-infarct ischemia    Charlton Haws

## 2010-09-29 ENCOUNTER — Ambulatory Visit (HOSPITAL_COMMUNITY): Payer: 59 | Attending: Internal Medicine | Admitting: Radiology

## 2010-09-29 DIAGNOSIS — R0989 Other specified symptoms and signs involving the circulatory and respiratory systems: Secondary | ICD-10-CM

## 2010-09-29 MED ORDER — TECHNETIUM TC 99M TETROFOSMIN IV KIT
33.0000 | PACK | Freq: Once | INTRAVENOUS | Status: AC | PRN
  Administered 2010-09-29: 33 via INTRAVENOUS

## 2010-10-03 ENCOUNTER — Telehealth: Payer: Self-pay | Admitting: *Deleted

## 2010-10-03 NOTE — Telephone Encounter (Signed)
Message copied by Lorayne Bender on Mon Oct 03, 2010 10:00 AM ------      Message from: [name], [name]      Created: Fri Sep 30, 2010 12:50 PM       Inferior attenuation ? Small infarct vs attenuation artifact. Nuclear study in 2011 also showed inferobasal attenuation. It will be helpful to review images.EF is normal.      Peter [name]

## 2010-10-03 NOTE — Telephone Encounter (Signed)
Notified of stress test results. Will see Lawson Fiscal on Thurs this week w/Dr. [name] to discuss possible cath.

## 2010-10-06 ENCOUNTER — Ambulatory Visit (INDEPENDENT_AMBULATORY_CARE_PROVIDER_SITE_OTHER): Payer: 59 | Admitting: Nurse Practitioner

## 2010-10-06 ENCOUNTER — Encounter: Payer: Self-pay | Admitting: Nurse Practitioner

## 2010-10-06 ENCOUNTER — Ambulatory Visit (INDEPENDENT_AMBULATORY_CARE_PROVIDER_SITE_OTHER): Payer: Self-pay | Admitting: *Deleted

## 2010-10-06 VITALS — BP 140/78 | HR 66 | Ht 67.0 in | Wt 287.0 lb

## 2010-10-06 DIAGNOSIS — Z7901 Long term (current) use of anticoagulants: Secondary | ICD-10-CM

## 2010-10-06 DIAGNOSIS — I712 Thoracic aortic aneurysm, without rupture: Secondary | ICD-10-CM

## 2010-10-06 DIAGNOSIS — I2510 Atherosclerotic heart disease of native coronary artery without angina pectoris: Secondary | ICD-10-CM

## 2010-10-06 DIAGNOSIS — I2699 Other pulmonary embolism without acute cor pulmonale: Secondary | ICD-10-CM

## 2010-10-06 DIAGNOSIS — Z01810 Encounter for preprocedural cardiovascular examination: Secondary | ICD-10-CM

## 2010-10-06 LAB — CBC WITH DIFFERENTIAL/PLATELET
Basophils Absolute: 0 10*3/uL (ref 0.0–0.1)
Basophils Relative: 0.5 % (ref 0.0–3.0)
Eosinophils Absolute: 0.1 10*3/uL (ref 0.0–0.7)
Eosinophils Relative: 2.7 % (ref 0.0–5.0)
HCT: 41.6 % (ref 39.0–52.0)
Hemoglobin: 13.8 g/dL (ref 13.0–17.0)
Lymphocytes Relative: 24.9 % (ref 12.0–46.0)
Lymphs Abs: 1.3 10*3/uL (ref 0.7–4.0)
MCHC: 33.2 g/dL (ref 30.0–36.0)
MCV: 88.6 fl (ref 78.0–100.0)
Monocytes Absolute: 0.5 10*3/uL (ref 0.1–1.0)
Monocytes Relative: 10.2 % (ref 3.0–12.0)
Neutro Abs: 3.2 10*3/uL (ref 1.4–7.7)
Neutrophils Relative %: 61.7 % (ref 43.0–77.0)
Platelets: 152 10*3/uL (ref 150.0–400.0)
RBC: 4.7 Mil/uL (ref 4.22–5.81)
RDW: 14.7 % — ABNORMAL HIGH (ref 11.5–14.6)
WBC: 5.1 10*3/uL (ref 4.5–10.5)

## 2010-10-06 LAB — BASIC METABOLIC PANEL
BUN: 15 mg/dL (ref 6–23)
CO2: 30 mEq/L (ref 19–32)
Calcium: 8.9 mg/dL (ref 8.4–10.5)
Chloride: 107 mEq/L (ref 96–112)
Creatinine, Ser: 0.9 mg/dL (ref 0.4–1.5)
GFR: 88.92 mL/min (ref >60.00)
Glucose, Bld: 151 mg/dL — ABNORMAL HIGH (ref 70–99)
Potassium: 3.6 mEq/L (ref 3.5–5.1)
Sodium: 145 mEq/L (ref 135–145)

## 2010-10-06 LAB — APTT: aPTT: 68.6 s — ABNORMAL HIGH (ref 21.7–28.8)

## 2010-10-06 LAB — PROTIME-INR: INR: 3.6 — AB (ref <=1.1)

## 2010-10-06 NOTE — Assessment & Plan Note (Signed)
Followed by Dr. [name] with yearly scans.

## 2010-10-06 NOTE — Progress Notes (Signed)
Carlos Dawson Date of Birth: 06-07-1940   History of Present Illness: Carlos Dawson is seen back today for a follow up visit. He is seen for Dr. [name]. He has had worsening shortness of breath, chest pain and edema. His nuclear study is abnormal. It showed small inferior wall infarct at mid and basal level with mild peri infarct ischemia. There is the question of inferior attenuation. Images have been reviewed by Dr. [name]. Cardiac cath is recommended. He continues to have symptoms. He saw no real improvement with the increase in his diuretic or Ranexa. He is anxious to proceed with cardiac cath. He has been to the beach. Salt use is not clearly defined. He has had more edema since his trip. He is not able to exercise.   Current Outpatient Prescriptions on File Prior to Visit  Medication Sig Dispense Refill  . albuterol (VENTOLIN HFA) 108 (90 BASE) MCG/ACT inhaler Inhale 2 puffs into the lungs every 6 (six) hours as needed for wheezing.  1 Inhaler  2  . amLODipine (NORVASC) 5 MG tablet Take 1 tablet (5 mg total) by mouth 2 (two) times daily.  60 tablet  11  . atenolol (TENORMIN) 25 MG tablet Take 25 mg by mouth daily.        . finasteride (PROSCAR) 5 MG tablet Take 5 mg by mouth daily.        . fluticasone (FLONASE) 50 MCG/ACT nasal spray Place 2 sprays into the nose as needed.       . furosemide (LASIX) 80 MG tablet Take 1 1/2 tablets two times a day  90 tablet  11  . pantoprazole (PROTONIX) 40 MG tablet Take 1 tablet (40 mg total) by mouth daily.  30 tablet  5  . potassium chloride SA (K-DUR,KLOR-CON) 20 MEQ tablet Take 20 mEq by mouth 2 (two) times daily.       . ranolazine (RANEXA) 1000 MG SR tablet Take 1 tablet (1,000 mg total) by mouth 2 (two) times daily.      . Tamsulosin HCl (FLOMAX) 0.4 MG CAPS Take 0.4 mg by mouth daily.        . traMADol (ULTRAM) 50 MG tablet Take 50 mg by mouth every 6 (six) hours as needed.        . valsartan (DIOVAN) 160 MG tablet Take 160 mg by mouth 2 (two)  times daily.        Marland Kitchen VITAMIN D, CHOLECALCIFEROL, PO Take 4,000 mg/day by mouth daily.        Marland Kitchen warfarin (COUMADIN) 5 MG tablet 5 mg five days a week and 7 and 1/2 two days a week      . DISCONTD: valsartan (DIOVAN) 160 MG tablet Take 1 tablet (160 mg total) by mouth daily.  60 tablet  11    Allergies  Allergen Reactions  . Adhesive (Tape)   . Morphine     REACTION: itch    Past Medical History  Diagnosis Date  . Hypertension   . Obesities, morbid   . Pulmonary embolism   . Anticoagulant long-term use   . CAD (coronary artery disease)     s/p cath 2006 with minimal disease  . Thoracic aortic aneurysm     followed by Dr. [name]  . OA (osteoarthritis)   . LVH (left ventricular hypertrophy)   . Paroxysmal atrial fibrillation   . Colonic polyp   . OSA (obstructive sleep apnea)     PSG 03/30/97 AHI 21, BPAP 13/9  .  Mild intermittent asthma   . Hemorrhoids   . Generalized headaches   . SOB (shortness of breath)   . Diastolic dysfunction   . ASCVD (arteriosclerotic cardiovascular disease)     post cath   . Sleep apnea   . GERD (gastroesophageal reflux disease)   . IBS (irritable bowel syndrome)   . Aortic root enlargement   . Ascending aortic aneurysm     recent scan showing no change  . PAF (paroxysmal atrial fibrillation)     treated with Coumadin    Past Surgical History  Procedure Date  . Hemorroidectomy   . Appendectomy   . Vasectomy   . Cervical spine surgery 06/02/2010    lower back and neck  . Gastric bypass     2009  . Cholecystectomy   . Rotator cuff repair     both  . Achilles tendon repair   . Cardiac catheterization 2006    History  Smoking status  . Former Smoker -- 1.5 packs/day for 30 years  . Quit date: 01/23/1994  Smokeless tobacco  . Not on file  Comment: Stopped in 1983    History  Alcohol Use No    Family History  Problem Relation Age of Onset  . Heart disease Mother   . Emphysema Father 36  . Heart attack Sister      Review of Systems: The review of systems is positive for continued chest pain and shortness of breath. Has had more swelling. Salt use is hard to define.  All other systems were reviewed and are negative.  Physical Exam: BP 140/78  Pulse 66  Ht 5\' 7"  (1.702 m)  Wt 287 lb (130.182 kg)  BMI 44.95 kg/m2 Patient is morbidly obese but in no acute distress. Skin is warm and dry. Color is normal.  HEENT is unremarkable. Normocephalic/atraumatic. PERRL. Sclera are nonicteric. Neck is supple. No masses. No JVD. Lungs are clear. Cardiac exam shows a regular rate and rhythm. Abdomen is obese but soft. Extremities are with 1+edema. Gait and ROM are intact. No gross neurologic deficits noted.  LABORATORY DATA: INR today is 3.6. Other labs are pending   Assessment / Plan:

## 2010-10-06 NOTE — Assessment & Plan Note (Signed)
Cardiac cath has been recommended. He is willing to proceed. He is scheduled for next Thursday with Dr. [name]. This will be a radial case. The procedure, risks and benefits are reviewed and he is willing to proceed. His medicines will be continued. Patient is agreeable to this plan and will call if any problems develop in the interim.

## 2010-10-06 NOTE — Patient Instructions (Addendum)
Stay on your current medicines We are going to arrange for a cardiac cath next Thursday Sept 20th  Go to [facility] Thursday Sept 20th at 7am Your procedure is scheduled for 9am 2nd Floor Short Stay No food or drink after midnight on Wednesday We will tell you when to stop your coumadin  INR is 3.6 today. Take no coumadin today, 5 mg Fri & Sat, then stop for procedure on Thursday  Angiography Angiography is a procedure used to look at the blood vessels (arteries) which carry the blood to different parts of your body. In this procedure a dye is injected through a catheter (a long, hollow tube about the size of a piece of cooked spaghetti) into an artery and x-rays are taken. The x-rays will show if there is a blockage or problem in a blood vessel.  PREPARATION FOR THE PROCEDURE  Let your caregiver know if you have had an allergy to dyes used in x-ray if you have ever had kidney problems or failure.   Do not eat or drink starting from midnight up to the time of the procedure, or as directed.   You may drink enough water to take your medications the morning of the procedure if you were instructed to do so.   You should be at the hospital or outpatient facility where the procedure is to be done 2 hours prior to the procedure or as directed.  PROCEDURE: 1. You may be given a medication to help you relax before and during the procedure through an IV in your hand or arm.  2. A local anesthetic to make the area numb may be used before inserting the catheter.  3. You will be prepared for the procedure by washing and shaving the area where the catheter will be inserted. This is usually done in the groin but may be done in the fold of your arm by your elbow.  4. A specially trained doctor will insert the catheter with a guide wire into an artery. This is guided under a special type of x-ray (fluoroscopy) to the blood vessel being examined.  5. Special dye is then injected and x-rays are taken.  These will show where any narrowing or blockages are located.  AFTER THE PROCEDURE  After the procedure you will be kept in bed for several hours.   The access site will be watched and you will be checked frequently.   Blood tests, other x-rays and an EKG may be done.   You may stay in the hospital overnight for observation.  SEEK IMMEDIATE MEDICAL CARE IF:  You develop chest pain, shortness of breath, feel faint, or pass out.   There is bleeding, swelling, or drainage from the catheter insertion site.   You develop pain, discoloration, coldness, or severe bruising in the leg or arm, or area where the catheter was inserted.   An oral temperature above 102 develops.  Document Released: 10/19/2004 Document Re-Released: 05/11/2007 [facility] Patient Information 2011 [place], [place].

## 2010-10-06 NOTE — Assessment & Plan Note (Signed)
INR is 3.6 today. He will take no coumadin today, 5 mg on Friday and Saturday and then stop. He will need a repeat INR next Thursday at the hospital.

## 2010-10-06 NOTE — Assessment & Plan Note (Signed)
This continues to be the crux of his problems in my opinion.

## 2010-10-07 ENCOUNTER — Telehealth: Payer: Self-pay | Admitting: *Deleted

## 2010-10-07 NOTE — Telephone Encounter (Signed)
Notified of lab results. Good for Cath on 9/20. Will send copy to Dr. Kevan Ny

## 2010-10-07 NOTE — Telephone Encounter (Signed)
Message copied by Lorayne Bender on Fri Oct 07, 2010  8:57 AM ------      Message from: Rosalio Macadamia      Created: Fri Oct 07, 2010  7:49 AM       Ok to report. Labs are satisfactory. Ok for cath next week.

## 2010-10-12 ENCOUNTER — Encounter: Payer: Self-pay | Admitting: *Deleted

## 2010-10-13 ENCOUNTER — Encounter: Payer: 59 | Admitting: *Deleted

## 2010-10-13 ENCOUNTER — Ambulatory Visit (HOSPITAL_COMMUNITY)
Admission: RE | Admit: 2010-10-13 | Discharge: 2010-10-14 | Disposition: A | Discharge status: Home / Self Care | Payer: 59 | Source: Ambulatory Visit | Attending: Cardiology | Admitting: Cardiology

## 2010-10-13 DIAGNOSIS — I2510 Atherosclerotic heart disease of native coronary artery without angina pectoris: Secondary | ICD-10-CM | POA: Insufficient documentation

## 2010-10-13 DIAGNOSIS — I359 Nonrheumatic aortic valve disorder, unspecified: Secondary | ICD-10-CM

## 2010-10-13 DIAGNOSIS — G4733 Obstructive sleep apnea (adult) (pediatric): Secondary | ICD-10-CM | POA: Insufficient documentation

## 2010-10-13 DIAGNOSIS — I4891 Unspecified atrial fibrillation: Secondary | ICD-10-CM | POA: Insufficient documentation

## 2010-10-13 DIAGNOSIS — K219 Gastro-esophageal reflux disease without esophagitis: Secondary | ICD-10-CM | POA: Insufficient documentation

## 2010-10-13 DIAGNOSIS — E669 Obesity, unspecified: Secondary | ICD-10-CM | POA: Insufficient documentation

## 2010-10-13 DIAGNOSIS — I10 Essential (primary) hypertension: Secondary | ICD-10-CM | POA: Insufficient documentation

## 2010-10-13 LAB — TROPONIN I
Troponin I: <0.3 ng/mL (ref <0.30)
Troponin I: <0.3 ng/mL (ref <0.30)

## 2010-10-13 LAB — PROTIME-INR
INR: 1.2 (ref 0.00–1.49)
Prothrombin Time: 15.5 seconds — ABNORMAL HIGH (ref 11.6–15.2)

## 2010-10-13 LAB — POCT ACTIVATED CLOTTING TIME: Activated Clotting Time: 584 seconds

## 2010-10-14 LAB — BASIC METABOLIC PANEL
BUN: 12 mg/dL (ref 6–23)
CO2: 30 mEq/L (ref 19–32)
Calcium: 8.4 mg/dL (ref 8.4–10.5)
Chloride: 103 mEq/L (ref 96–112)
Creatinine, Ser: 0.86 mg/dL (ref 0.50–1.35)
GFR calc Af Amer: >60 mL/min (ref >60)
GFR calc non Af Amer: >60 mL/min (ref >60)
Glucose, Bld: 125 mg/dL — ABNORMAL HIGH (ref 70–99)
Potassium: 3.3 mEq/L — ABNORMAL LOW (ref 3.5–5.1)
Sodium: 139 mEq/L (ref 135–145)

## 2010-10-14 LAB — CBC
HCT: 37.7 % — ABNORMAL LOW (ref 39.0–52.0)
Hemoglobin: 12.8 g/dL — ABNORMAL LOW (ref 13.0–17.0)
MCH: 28.2 pg (ref 26.0–34.0)
MCHC: 34 g/dL (ref 30.0–36.0)
MCV: 83 fL (ref 78.0–100.0)
Platelets: 121 10*3/uL — ABNORMAL LOW (ref 150–400)
RBC: 4.54 MIL/uL (ref 4.22–5.81)
RDW: 13.5 % (ref 11.5–15.5)
WBC: 5.5 10*3/uL (ref 4.0–10.5)

## 2010-10-14 LAB — PLATELET INHIBITION P2Y12: Platelet Function  P2Y12: 179 [PRU] — ABNORMAL LOW (ref 194–418)

## 2010-10-15 NOTE — Discharge Summary (Addendum)
NAME:  Carlos Dawson, Carlos Dawson NO.:  [id number]  MEDICAL RECORD NO.:  [id number]  LOCATION:  2504                         FACILITY:  [facility]  PHYSICIAN:  Doniqua Saxby M. [name], M.D.  DATE OF BIRTH:  03-17-1940  DATE OF ADMISSION:  10/13/2010 DATE OF DISCHARGE:  10/14/2010                              DISCHARGE SUMMARY   DISCHARGE DIAGNOSES: 1. Coronary artery disease, status post bare-metal stent placement to     the second obtuse marginal artery.     a.     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 2. Hypertension. 3. Paroxysmal atrial fibrillation.     a.     Coumadin discontinued, in lieu of aspirin and Plavix for      stenting for 4-6 weeks. Plan is to eventually resume after      discontinuation of Plavix. 4. Obesity. 5. History of pulmonary embolism per record. 6. Thoracic aortic aneurysm, followed by Dr. [name]. 7. Osteoarthritis. 8. Prior colon polyps. 9. Obstructive sleep apnea. 10.Mild intermittent asthma. 11.Hemorrhoids. 12.Gastroesophageal reflux disease. 13.Irritable bowel syndrome.  HOSPITAL COURSE:  Carlos Dawson is a 70 year old gentleman who was seen in the office for chest pain, shortness of breath, and edema.  He previously had nonobstructive CAD in 2006 and had a nuclear scan recently showing small inferior wall infarct in the mid basal area with mild peri-infarct ischemia, question of inferior attenuation.  The patient had no improvement in his symptoms with diuretics or Ranexa and cardiac catheterization was recommended.  Coumadin was held in anticipation for this procedure.  He was brought in the hospital on October 13, 2010, for his cath which demonstrated single vessel obstructive CAD.  There was a separate coronary ostia for the LAD and left circumflex with significant distortion of the anatomy based  on the aortic root enlargement.  He ultimately had successful intracoronary stenting of the second OM with a bare-metal stent.  The patient tolerated the procedure well.  Dr. [name] recommended to continue aspirin and Plavix for 4-6 weeks, then discontinuing Plavix and resume Coumadin.  Coumadin will be held for now.  P2Y12 was checked showing a PRU of 179 which was felt adequate.  Ranexa was discontinued.  The patient was seen and examined by Dr. [name] and felt stable for discharge.  His potassium was also repleted prior to discharge.  DISCHARGE LABS:  Sodium 139, potassium 3.6, chloride 103, CO2 of 30, glucose 125, BUN 12, and creatinine 0.86.  Troponins were negative x2. P2Y12 was 179.  WBC 5.5, hemoglobin 12.8, hematocrit 37.7, and platelet count 121.  STUDIES:  Cardiac catheterization on October 13, 2010, please see full report for details as well as HPI for summary.  DISCHARGE MEDICATIONS: 1. Aspirin 81 mg daily. 2. Plavix 75 mg daily. 3. Nitroglycerin sublingual 0.4 mg every 5 minutes as needed up to 3 doses for chest pain. 4. Albuterol inhaler 1 puff inhaled q.4 h. p.r.n. shortness of breath. 5. Amlodipine 5 mg daily. 6. Atenolol 25 mg every morning. 7. Flomax 0.4 mg nightly. 8. Fluticasone  60 mcg nasally 2 spays each nostril daily at bedtime as     needed for congestion. 9. Furosemide 80 mg 1/2 tablet b.i.d. 10.Finasteride 5 mg daily. 11.Klor-Con 20 mEq 2 tablets b.i.d. 12.Protonix 40 mg every morning. 13.Valsartan 160 mg b.i.d. 14.Vitamin D 2000 units 3 tablets every morning. 15. Crestor 10mg  po qhs.  Please see above for discussion regarding Coumadin.  Ranexa was also discontinued this admission.  DISPOSITION:  Carlos Dawson will be discharged in stable condition to home. He is not to lift anything over 5 pounds for 1 week, drive for 2 days, or participate in sexual activity for 1 week.  He should follow a low- sodium, heart-healthy diet and call or return if he  notices any pain, swelling, bleeding, or pus at the cath site.  He will follow with Norma Fredrickson, NP, on October 28, 2010, at 9:15 a.m.  Please see above for discussion regarding eventual resumption of Coumadin.  Duration of discharge encounter is greater than 30 minutes including physician and PA time.     Ronie Spies, P.A.C.   ______________________________ Denaly Gatling M. [name], M.D.    DD/MEDQ  D:  10/14/2010  T:  10/14/2010  Job:  [id number]  cc:   Dante Gang, N.P.  Electronically Signed by Ronie Spies  on 10/15/2010 08:54:38 AM Electronically Signed by Tarryn Bogdan [name] M.D. on 10/18/2010 01:32:15 PM

## 2010-10-18 LAB — BASIC METABOLIC PANEL
BUN: 16
CO2: 28
Calcium: 9
Chloride: 104
Creatinine, Ser: 0.99
GFR calc Af Amer: >60
GFR calc non Af Amer: >60
Glucose, Bld: 114 — ABNORMAL HIGH
Potassium: 3.7
Sodium: 141

## 2010-10-18 LAB — HEMOGLOBIN AND HEMATOCRIT, BLOOD
HCT: 40.6
Hemoglobin: 13.8

## 2010-10-18 NOTE — Cardiovascular Report (Signed)
NAME:  OSWELL, SAY NO.:  [id number]  MEDICAL RECORD NO.:  [id number]  LOCATION:  2504                         FACILITY:  [facility]  PHYSICIAN:  [name]. [name], M.D.  DATE OF BIRTH:  Oct 26, 1940  DATE OF PROCEDURE: DATE OF DISCHARGE:                           CARDIAC CATHETERIZATION   INDICATIONS FOR PROCEDURE:  A 70 year old white male with history of morbid obesity, paroxysmal atrial fibrillation, obstructive sleep apnea, who presents with symptoms of increasing chest pain and dyspnea on exertion.  Stress Myoview study indicated evidence of inferior wall ischemia.  PROCEDURE:  Coronary angiography, aortic root angiography, and intracoronary stenting of the second obtuse marginal branch.  ACCESS:  We initially attempted right radial access.  Despite having excellent arterial flow on at least 3 occasions, we were unable to thread the wire more than 5 cm above the access site.  We abandoned radial approach and proceeded with a right femoral access.  This was also very limited by his morbid obesity and poor pulse.  Access was difficult and we had to use a Doppler needle to access the right femoral artery.  The procedure was also made difficult by his aortic root enlargement with significantly distorted coronary anatomy.  The left coronary artery was engaged with a 5-French 6-cm left Judkins catheter. The right coronary was engaged with a 5-French left Amplatz 1 catheter. We also were never able to cross the aortic valve due to the markedly distorted aortic root anatomy.  This despite using a pigtail catheter, multipurpose catheter, left Amplatz catheter and both J and straight wires.  We therefore never were able to do a left ventricular angiography or measure left ventricular pressures.  MEDICATIONS: 1. Versed total of 2 mg IV. 2. Fentanyl 25 mcg IV. 3. Angiomax bolus of 0.75 mg/kg followed by continuous infusion of     1.75 mg/kg per hour.  The patient was  also enrolled in our champion     [place] study and given bolus drug and infusion per protocol.  He     was given 4 capsules of oral study medication for antiplatelet     therapy, nitroglycerin 200 mcg intracoronary x2, contrast 225 mL of     Omnipaque.  ANGIOGRAPHIC DATA:  The left coronary artery arises with 2 separate ostia.  There is a broad ostia for the left circumflex coronary, which is a large dominant vessel.  It gives rise to a central trunk which gives rise to 2 large obtuse marginal vessels.  The left circumflex continues in the AV groove and gives rise to the PDA and posterolateral branches.  The second obtuse marginal vessel was a very large branch which has a 90% stenosis with significant central ulceration.  The remainder of the left circumflex coronary demonstrates only minimal wall irregularities.  The left anterior descending artery is a relatively small branch.  Has 30% disease in the midvessel.  There was also 30% disease involving the first diagonal branch.  The right coronary arises somewhat anteriorly.  It is a small nondominant vessel.  There is separate ostia for both the right coronary artery and the conus branch.  These vessels appear normal.  Aortic angiography was performed in the  RAO view.  This demonstrates diffuse aortic enlargement of at least moderate severity involving the sinotubular junction.  There is no significant aortic insufficiency.  We proceeded at this point with percutaneous intervention of the large second obtuse marginal vessel.  Using a 6-French left Voda 5 guide and an Asahi medium wire, we were able to cross the lesion without difficulty.  We predilated the lesion using a 2.5 x 15 mm Trek balloon to 8 atmospheres.  We then placed a 3.0 x 20-mm  Veriflex bare metal stent deploying this at 10 atmospheres across the first marginal branch. This was then postdilated with a 3.5 x 15-mm [place] Trek balloon to 16 atmospheres x2.  The more  proximal portion of the stent was postdilated with a 3.75 x 15-mm [place] Trek to 15 atmospheres.  This yielded a good angiographic result with 0% residual stenosis.  There was still some evidence of the ulceration cavity exterior to the stent but there was TIMI grade 3 flow.  FINAL INTERPRETATION: 1. Single vessel obstructive atherosclerotic coronary artery disease. 2. Separate coronary ostia for the LAD and left circumflex coronary     with significant distortion of anatomy based on the aortic root     enlargement. 3. Moderate aortic root enlargement. 4. Successful intracoronary stenting of the second obtuse marginal     vessel with a bare metal stent.          ______________________________ [name]. [name], M.D.     PMJ/MEDQ  D:  10/13/2010  T:  10/13/2010  Job:  [id number]  cc:   Sheliah Plane, MD  Electronically Signed by PETER [name] M.D. on 10/18/2010 01:32:08 PM

## 2010-10-19 LAB — CBC
HCT: 39.4
Hemoglobin: 13.3
MCHC: 33.8
MCV: 82.9
Platelets: 170
RBC: 4.75
RDW: 15
WBC: 5.3

## 2010-10-19 LAB — BASIC METABOLIC PANEL
BUN: 15
CO2: 24
Calcium: 8.9
Chloride: 106
Creatinine, Ser: 1.11
GFR calc Af Amer: >60
GFR calc non Af Amer: >60
Glucose, Bld: 110 — ABNORMAL HIGH
Potassium: 3.8
Sodium: 138

## 2010-10-19 LAB — APTT: aPTT: 58 — ABNORMAL HIGH

## 2010-10-19 LAB — PROTIME-INR
INR: 1.5
Prothrombin Time: 18.6 — ABNORMAL HIGH

## 2010-10-20 ENCOUNTER — Encounter: Payer: 59 | Admitting: Nurse Practitioner

## 2010-10-20 LAB — CLOSTRIDIUM DIFFICILE EIA: C difficile Toxins A+B, EIA: NEGATIVE

## 2010-10-20 LAB — BASIC METABOLIC PANEL
BUN: 7
CO2: 30
Calcium: 8.3 — ABNORMAL LOW
Chloride: 104
Creatinine, Ser: 0.97
GFR calc Af Amer: >60
GFR calc non Af Amer: >60
Glucose, Bld: 119 — ABNORMAL HIGH
Potassium: 3.2 — ABNORMAL LOW
Sodium: 139

## 2010-10-20 LAB — CBC
HCT: 37.9 — ABNORMAL LOW
HCT: 42.4
Hemoglobin: 12.6 — ABNORMAL LOW
Hemoglobin: 14.1
MCHC: 33.2
MCHC: 33.3
MCV: 83.7
MCV: 84.4
Platelets: 127 — ABNORMAL LOW
Platelets: 162
RBC: 4.49
RBC: 5.07
RDW: 14.1
RDW: 14.9
WBC: 5.3
WBC: 6.6

## 2010-10-20 LAB — URINALYSIS, ROUTINE W REFLEX MICROSCOPIC
Glucose, UA: NEGATIVE
Hgb urine dipstick: NEGATIVE
Ketones, ur: NEGATIVE
Nitrite: NEGATIVE
Protein, ur: NEGATIVE
Specific Gravity, Urine: 1.029
Urobilinogen, UA: 1
pH: 6

## 2010-10-20 LAB — COMPREHENSIVE METABOLIC PANEL
ALT: 17
AST: 25
Albumin: 3.6
Alkaline Phosphatase: 90
BUN: 10
CO2: 22
Calcium: 9.1
Chloride: 107
Creatinine, Ser: 0.94
GFR calc Af Amer: >60
GFR calc non Af Amer: >60
Glucose, Bld: 114 — ABNORMAL HIGH
Potassium: 4.5
Sodium: 138
Total Bilirubin: 1.7 — ABNORMAL HIGH
Total Protein: 6.3

## 2010-10-20 LAB — URINE MICROSCOPIC-ADD ON

## 2010-10-20 LAB — DIFFERENTIAL
Basophils Absolute: 0.1
Basophils Relative: 2 — ABNORMAL HIGH
Eosinophils Absolute: 0.1
Eosinophils Relative: 2
Lymphocytes Relative: 18
Lymphs Abs: 1.2
Monocytes Absolute: 0.5
Monocytes Relative: 8
Neutro Abs: 4.7
Neutrophils Relative %: 70

## 2010-10-20 LAB — LIPASE, BLOOD: Lipase: 11

## 2010-10-20 LAB — PROTIME-INR
INR: 1.7 — ABNORMAL HIGH
Prothrombin Time: 20.5 — ABNORMAL HIGH

## 2010-10-21 LAB — CBC
HCT: 40
Hemoglobin: 13.4
MCHC: 33.4
MCV: 86
Platelets: 148 — ABNORMAL LOW
RBC: 4.66
RDW: 14.4
WBC: 5.8

## 2010-10-21 LAB — PROTIME-INR
INR: 1.1
Prothrombin Time: 14.8

## 2010-10-21 LAB — COMPREHENSIVE METABOLIC PANEL
ALT: 18
AST: 17
Albumin: 3.8
Alkaline Phosphatase: 89
BUN: 12
CO2: 25
Calcium: 8.9
Chloride: 107
Creatinine, Ser: 0.86
GFR calc Af Amer: >60
GFR calc non Af Amer: >60
Glucose, Bld: 95
Potassium: 4
Sodium: 139
Total Bilirubin: 1
Total Protein: 6.5

## 2010-10-21 LAB — APTT: aPTT: 49 — ABNORMAL HIGH

## 2010-10-24 HISTORY — PX: CORONARY ANGIOPLASTY WITH STENT PLACEMENT: SHX49

## 2010-10-25 LAB — CBC
HCT: 43.6
Hemoglobin: 14.6
MCHC: 33.6
MCV: 88.1
Platelets: 154
RBC: 4.95
RDW: 13.4
WBC: 7.7

## 2010-10-25 LAB — BASIC METABOLIC PANEL
BUN: 15
CO2: 27
Calcium: 9
Chloride: 104
Creatinine, Ser: 0.94
GFR calc Af Amer: >60
GFR calc non Af Amer: >60
Glucose, Bld: 203 — ABNORMAL HIGH
Potassium: 3.9
Sodium: 142

## 2010-10-25 LAB — APTT: aPTT: 44 — ABNORMAL HIGH

## 2010-10-25 LAB — GLUCOSE, CAPILLARY: Glucose-Capillary: 97

## 2010-10-25 LAB — PROTIME-INR
INR: 1.1
Prothrombin Time: 14

## 2010-10-26 ENCOUNTER — Telehealth: Payer: Self-pay | Admitting: Cardiology

## 2010-10-26 NOTE — Telephone Encounter (Signed)
Advised that Dr. [name] reviewed.

## 2010-10-26 NOTE — Telephone Encounter (Signed)
Byrd Hesselbach with [facility] calling re status of fax she sent to anita yesterday on this pt

## 2010-10-28 ENCOUNTER — Telehealth: Payer: Self-pay | Admitting: *Deleted

## 2010-10-28 ENCOUNTER — Ambulatory Visit (INDEPENDENT_AMBULATORY_CARE_PROVIDER_SITE_OTHER): Payer: 59 | Admitting: Nurse Practitioner

## 2010-10-28 ENCOUNTER — Encounter: Payer: Self-pay | Admitting: Nurse Practitioner

## 2010-10-28 VITALS — BP 104/68 | HR 62 | Ht 67.0 in | Wt 283.0 lb

## 2010-10-28 DIAGNOSIS — Z955 Presence of coronary angioplasty implant and graft: Secondary | ICD-10-CM

## 2010-10-28 DIAGNOSIS — I4891 Unspecified atrial fibrillation: Secondary | ICD-10-CM

## 2010-10-28 DIAGNOSIS — I2510 Atherosclerotic heart disease of native coronary artery without angina pectoris: Secondary | ICD-10-CM

## 2010-10-28 DIAGNOSIS — I480 Paroxysmal atrial fibrillation: Secondary | ICD-10-CM

## 2010-10-28 DIAGNOSIS — Z9861 Coronary angioplasty status: Secondary | ICD-10-CM

## 2010-10-28 LAB — BASIC METABOLIC PANEL
BUN: 14 mg/dL (ref 6–23)
CO2: 28 mEq/L (ref 19–32)
Calcium: 8.6 mg/dL (ref 8.4–10.5)
Chloride: 104 mEq/L (ref 96–112)
Creatinine, Ser: 0.9 mg/dL (ref 0.4–1.5)
GFR: 93.7 mL/min (ref >60.00)
Glucose, Bld: 185 mg/dL — ABNORMAL HIGH (ref 70–99)
Potassium: 3.2 mEq/L — ABNORMAL LOW (ref 3.5–5.1)
Sodium: 140 mEq/L (ref 135–145)

## 2010-10-28 MED ORDER — POTASSIUM CHLORIDE CRYS ER 20 MEQ PO TBCR: EXTENDED_RELEASE_TABLET | ORAL | Status: DC

## 2010-10-28 NOTE — Assessment & Plan Note (Signed)
No recurrence. He will restart coumadin after 30 days of Plavix therapy.

## 2010-10-28 NOTE — Telephone Encounter (Signed)
Pt unable to take k+ three times a day, evening dose causes indigestion. Told it was ok to take three tablets in am and three tablets in afternoon. Pt aware will have lab draw on next visit.

## 2010-10-28 NOTE — Patient Instructions (Addendum)
You will take the Plavix for one month total. When finished, restart your coumadin at your prior dose. You will need a protime 1 week after restarting. Diet, exercise and weight loss are encouraged.  We are going to recheck your kidney function OK to start cardiac rehab.  I will see you in 4 weeks. Call for any problems.

## 2010-10-28 NOTE — Assessment & Plan Note (Signed)
He says he is some better but not really dramatically improved. He will give it some time. He is to finish his 30 days of Plavix and then restart his coumadin. May need to restart the Ranexa. Will see what Dr. Elvis Coil input is. I will see him back in 4 weeks. Patient is agreeable to this plan and will call if any problems develop in the interim.

## 2010-10-28 NOTE — Progress Notes (Signed)
Carlos Dawson Date of Birth: 1940/10/15   History of Present Illness: Carlos Dawson is seen back today for a post hospital visit. He is seen for Dr. [name]. He has had BMS to the 2nd OM. He is on Plavix for one month. His coumadin was stopped while he is on Plavix. He says he is doing better but really only feels about 10 to 15% better overall. He still has some chest pain. He was taken off of his Ranexa. He is not sure it ever helped him. He does not tolerate nitrates. His shortness of breath is better. He thinks his rhythm has been ok. He will be starting rehab next week.   Current Outpatient Prescriptions on File Prior to Visit  Medication Sig Dispense Refill  . albuterol (VENTOLIN HFA) 108 (90 BASE) MCG/ACT inhaler Inhale 2 puffs into the lungs every 6 (six) hours as needed for wheezing.  1 Inhaler  2  . amLODipine (NORVASC) 5 MG tablet Take 1 tablet (5 mg total) by mouth 2 (two) times daily.  60 tablet  11  . aspirin 81 MG tablet Take 81 mg by mouth daily.        Marland Kitchen atenolol (TENORMIN) 25 MG tablet Take 25 mg by mouth daily.        . clopidogrel (PLAVIX) 75 MG tablet Take 75 mg by mouth daily.        . finasteride (PROSCAR) 5 MG tablet Take 5 mg by mouth daily.        . fluticasone (FLONASE) 50 MCG/ACT nasal spray Place 2 sprays into the nose as needed.       . furosemide (LASIX) 80 MG tablet Take 1 1/2 tablets two times a day  90 tablet  11  . nitroGLYCERIN (NITROSTAT) 0.4 MG SL tablet Place 0.4 mg under the tongue every 5 (five) minutes as needed.        . pantoprazole (PROTONIX) 40 MG tablet Take 1 tablet (40 mg total) by mouth daily.  30 tablet  5  . potassium chloride SA (K-DUR,KLOR-CON) 20 MEQ tablet Take 20 mEq by mouth 2 (two) times daily.       . Rosuvastatin Calcium (CRESTOR PO) Take by mouth daily.        . Tamsulosin HCl (FLOMAX) 0.4 MG CAPS Take 0.4 mg by mouth daily.        . traMADol (ULTRAM) 50 MG tablet Take 50 mg by mouth every 6 (six) hours as needed.        . valsartan  (DIOVAN) 160 MG tablet Take 160 mg by mouth 2 (two) times daily.        Marland Kitchen VITAMIN D, CHOLECALCIFEROL, PO Take 4,000 mg/day by mouth daily.        Marland Kitchen DISCONTD: valsartan (DIOVAN) 160 MG tablet Take 1 tablet (160 mg total) by mouth daily.  60 tablet  11    Allergies  Allergen Reactions  . Adhesive (Tape)   . Morphine     REACTION: itch    Past Medical History  Diagnosis Date  . Hypertension   . Obesities, morbid   . Pulmonary embolism   . Anticoagulant long-term use   . CAD (coronary artery disease)     s/p BMS to 2nd OM in Sept 2012  . Thoracic aortic aneurysm     followed by Dr. [name]  . OA (osteoarthritis)   . LVH (left ventricular hypertrophy)   . Paroxysmal atrial fibrillation   . Colonic polyp   .  OSA (obstructive sleep apnea)     PSG 03/30/97 AHI 21, BPAP 13/9  . Mild intermittent asthma   . Hemorrhoids   . Generalized headaches   . SOB (shortness of breath)   . Diastolic dysfunction   . ASCVD (arteriosclerotic cardiovascular disease)     post cath   . Sleep apnea   . GERD (gastroesophageal reflux disease)   . IBS (irritable bowel syndrome)   . Aortic root enlargement   . Ascending aortic aneurysm     recent scan showing no change  . PAF (paroxysmal atrial fibrillation)     treated with Coumadin    Past Surgical History  Procedure Date  . Hemorroidectomy   . Appendectomy   . Vasectomy   . Cervical spine surgery 06/02/2010    lower back and neck  . Gastric bypass     2009  . Cholecystectomy   . Rotator cuff repair     both  . Achilles tendon repair   . Cardiac catheterization 2006    History  Smoking status  . Former Smoker -- 1.5 packs/day for 30 years  . Quit date: 01/23/1994  Smokeless tobacco  . Not on file  Comment: Stopped in 1983    History  Alcohol Use No    Family History  Problem Relation Age of Onset  . Heart disease Mother   . Emphysema Father 67  . Heart attack Sister     Review of Systems: The review of systems is  positive for continued bouts of chest pain and shortness of breath. Weight is down a little.  He is quite bruised. He has some soreness in the left upper thigh. His cath site on the right is ok. All other systems were reviewed and are negative.  Physical Exam: BP 104/68  Pulse 62  Ht 5\' 7"  (1.702 m)  Wt 283 lb (128.368 kg)  BMI 44.32 kg/m2 Patient is pleasant and in no acute distress. He is morbidly obese. Skin is warm and dry. Color is normal.  He is very ecchymotic. HEENT is unremarkable. Normocephalic/atraumatic. PERRL. Sclera are nonicteric. Neck is supple. No masses. No JVD. Lungs are clear with decreased breath sounds. Cardiac exam shows a regular rate and rhythm.Heart tones are distant. Abdomen is obese and soft. Extremities are without edema. Gait and ROM are intact. No gross neurologic deficits noted.   LABORATORY DATA:   Assessment / Plan:

## 2010-10-28 NOTE — Assessment & Plan Note (Signed)
Once again, he is encouraged to really work on his weight. This is felt to be the crux of his issues.

## 2010-10-30 NOTE — Telephone Encounter (Signed)
Noted  

## 2010-11-07 ENCOUNTER — Ambulatory Visit (HOSPITAL_COMMUNITY): Payer: 59

## 2010-11-07 ENCOUNTER — Encounter (HOSPITAL_COMMUNITY)
Admission: RE | Admit: 2010-11-07 | Discharge: 2010-11-07 | Disposition: A | Discharge status: Home / Self Care | Payer: 59 | Source: Ambulatory Visit | Attending: Cardiology | Admitting: Cardiology

## 2010-11-07 DIAGNOSIS — Z9884 Bariatric surgery status: Secondary | ICD-10-CM | POA: Insufficient documentation

## 2010-11-07 DIAGNOSIS — I10 Essential (primary) hypertension: Secondary | ICD-10-CM | POA: Insufficient documentation

## 2010-11-07 DIAGNOSIS — Z9861 Coronary angioplasty status: Secondary | ICD-10-CM | POA: Insufficient documentation

## 2010-11-07 DIAGNOSIS — I4891 Unspecified atrial fibrillation: Secondary | ICD-10-CM | POA: Insufficient documentation

## 2010-11-07 DIAGNOSIS — Z5189 Encounter for other specified aftercare: Secondary | ICD-10-CM | POA: Insufficient documentation

## 2010-11-07 DIAGNOSIS — Z86718 Personal history of other venous thrombosis and embolism: Secondary | ICD-10-CM | POA: Insufficient documentation

## 2010-11-07 DIAGNOSIS — Z7901 Long term (current) use of anticoagulants: Secondary | ICD-10-CM | POA: Insufficient documentation

## 2010-11-07 DIAGNOSIS — I714 Abdominal aortic aneurysm, without rupture, unspecified: Secondary | ICD-10-CM | POA: Insufficient documentation

## 2010-11-07 DIAGNOSIS — G4733 Obstructive sleep apnea (adult) (pediatric): Secondary | ICD-10-CM | POA: Insufficient documentation

## 2010-11-07 DIAGNOSIS — I2510 Atherosclerotic heart disease of native coronary artery without angina pectoris: Secondary | ICD-10-CM | POA: Insufficient documentation

## 2010-11-07 DIAGNOSIS — K219 Gastro-esophageal reflux disease without esophagitis: Secondary | ICD-10-CM | POA: Insufficient documentation

## 2010-11-07 LAB — BASIC METABOLIC PANEL
BUN: 16
BUN: 13
BUN: 7
BUN: 7
BUN: 8
BUN: 9
CO2: 24
CO2: 22
CO2: 23
CO2: 23
CO2: 23
CO2: 28
Calcium: 8.8
Calcium: 7.9 — ABNORMAL LOW
Calcium: 8.2 — ABNORMAL LOW
Calcium: 8.2 — ABNORMAL LOW
Calcium: 8.2 — ABNORMAL LOW
Calcium: 8.5
Chloride: 106
Chloride: 104
Chloride: 106
Chloride: 107
Chloride: 108
Chloride: 110
Creatinine, Ser: 0.99
Creatinine, Ser: 0.99
Creatinine, Ser: 0.99
Creatinine, Ser: 1.02
Creatinine, Ser: 1.2
Creatinine, Ser: 1.24
GFR calc Af Amer: >60
GFR calc Af Amer: >60
GFR calc Af Amer: >60
GFR calc Af Amer: >60
GFR calc Af Amer: >60
GFR calc Af Amer: >60
GFR calc non Af Amer: >60
GFR calc non Af Amer: 59 — ABNORMAL LOW
GFR calc non Af Amer: >60
GFR calc non Af Amer: >60
GFR calc non Af Amer: >60
GFR calc non Af Amer: >60
Glucose, Bld: 148 — ABNORMAL HIGH
Glucose, Bld: 120 — ABNORMAL HIGH
Glucose, Bld: 135 — ABNORMAL HIGH
Glucose, Bld: 144 — ABNORMAL HIGH
Glucose, Bld: 145 — ABNORMAL HIGH
Glucose, Bld: 183 — ABNORMAL HIGH
Potassium: 3.9
Potassium: 3.4 — ABNORMAL LOW
Potassium: 3.8
Potassium: 3.8
Potassium: 4
Potassium: 4.6
Sodium: 137
Sodium: 136
Sodium: 136
Sodium: 138
Sodium: 138
Sodium: 141

## 2010-11-07 LAB — HEPARIN LEVEL (UNFRACTIONATED)
Heparin Unfractionated: 0.19 — ABNORMAL LOW
Heparin Unfractionated: 0.23 — ABNORMAL LOW
Heparin Unfractionated: 0.3
Heparin Unfractionated: <0.1 — ABNORMAL LOW
Heparin Unfractionated: 0.13 — ABNORMAL LOW
Heparin Unfractionated: 0.38
Heparin Unfractionated: 1.17 — ABNORMAL HIGH

## 2010-11-07 LAB — POCT I-STAT 3, ART BLOOD GAS (G3+)
Acid-Base Excess: 2
Bicarbonate: 26 — ABNORMAL HIGH
O2 Saturation: 100
Operator id: 135351
Patient temperature: 98.7
TCO2: 27
pCO2 arterial: 37.9
pH, Arterial: 7.445
pO2, Arterial: 174 — ABNORMAL HIGH

## 2010-11-07 LAB — POCT CARDIAC MARKERS
CKMB, poc: 1.2
Myoglobin, poc: 245
Operator id: 279831
Troponin i, poc: <0.05

## 2010-11-07 LAB — DIFFERENTIAL
Basophils Absolute: 0
Basophils Absolute: 0
Basophils Absolute: 0
Basophils Absolute: 0
Basophils Relative: 0
Basophils Relative: 0
Basophils Relative: 0
Basophils Relative: 0
Eosinophils Absolute: 0.1
Eosinophils Absolute: 0
Eosinophils Absolute: 0
Eosinophils Absolute: 0.2
Eosinophils Relative: 1
Eosinophils Relative: 0
Eosinophils Relative: 0
Eosinophils Relative: 3
Lymphocytes Relative: 10 — ABNORMAL LOW
Lymphocytes Relative: 13
Lymphocytes Relative: 20
Lymphocytes Relative: 8 — ABNORMAL LOW
Lymphs Abs: 1.1
Lymphs Abs: 0.7
Lymphs Abs: 0.9
Lymphs Abs: 1.2
Monocytes Absolute: 0.7
Monocytes Absolute: 0.2
Monocytes Absolute: 0.6
Monocytes Absolute: 0.8 — ABNORMAL HIGH
Monocytes Relative: 6
Monocytes Relative: 10
Monocytes Relative: 13 — ABNORMAL HIGH
Monocytes Relative: 2 — ABNORMAL LOW
Neutro Abs: 8.8 — ABNORMAL HIGH
Neutro Abs: 3.9
Neutro Abs: 4.4
Neutro Abs: 9.7 — ABNORMAL HIGH
Neutrophils Relative %: 83 — ABNORMAL HIGH
Neutrophils Relative %: 64
Neutrophils Relative %: 77
Neutrophils Relative %: 90 — ABNORMAL HIGH

## 2010-11-07 LAB — PROTIME-INR
INR: 1.7 — ABNORMAL HIGH
INR: 1.7 — ABNORMAL HIGH
INR: 1.1
INR: 1.1
INR: 1.3
INR: 1.3
INR: 1.4
INR: 1.7 — ABNORMAL HIGH
Prothrombin Time: 20.6 — ABNORMAL HIGH
Prothrombin Time: 20.9 — ABNORMAL HIGH
Prothrombin Time: 14
Prothrombin Time: 14.9
Prothrombin Time: 16.3 — ABNORMAL HIGH
Prothrombin Time: 17 — ABNORMAL HIGH
Prothrombin Time: 17.2 — ABNORMAL HIGH
Prothrombin Time: 20.9 — ABNORMAL HIGH

## 2010-11-07 LAB — CBC
HCT: 33.8 — ABNORMAL LOW
HCT: 34.8 — ABNORMAL LOW
HCT: 38.2 — ABNORMAL LOW
HCT: 33.4 — ABNORMAL LOW
HCT: 34.6 — ABNORMAL LOW
HCT: 34.7 — ABNORMAL LOW
HCT: 34.8 — ABNORMAL LOW
HCT: 35 — ABNORMAL LOW
HCT: 36 — ABNORMAL LOW
HCT: 37.2 — ABNORMAL LOW
HCT: 42.8
Hemoglobin: 11.5 — ABNORMAL LOW
Hemoglobin: 11.7 — ABNORMAL LOW
Hemoglobin: 13.1
Hemoglobin: 11.3 — ABNORMAL LOW
Hemoglobin: 11.7 — ABNORMAL LOW
Hemoglobin: 11.7 — ABNORMAL LOW
Hemoglobin: 11.7 — ABNORMAL LOW
Hemoglobin: 12.3 — ABNORMAL LOW
Hemoglobin: 12.4 — ABNORMAL LOW
Hemoglobin: 12.5 — ABNORMAL LOW
Hemoglobin: 14.4
MCHC: 33.7
MCHC: 33.9
MCHC: 34.1
MCHC: 33.5
MCHC: 33.8
MCHC: 33.8
MCHC: 33.8
MCHC: 33.9
MCHC: 33.9
MCHC: 34.2
MCHC: 35.4
MCV: 84.5
MCV: 84.6
MCV: 84.8
MCV: 84.9
MCV: 85.2
MCV: 85.3
MCV: 85.3
MCV: 85.4
MCV: 85.9
MCV: 86.7
MCV: 88.3
Platelets: 207
Platelets: 263
Platelets: 264
Platelets: 116 — ABNORMAL LOW
Platelets: 122 — ABNORMAL LOW
Platelets: 125 — ABNORMAL LOW
Platelets: 161
Platelets: 163
Platelets: 84 — ABNORMAL LOW
Platelets: 85 — ABNORMAL LOW
Platelets: 93 — ABNORMAL LOW
RBC: 4.01 — ABNORMAL LOW
RBC: 4.12 — ABNORMAL LOW
RBC: 4.51
RBC: 3.89 — ABNORMAL LOW
RBC: 3.96 — ABNORMAL LOW
RBC: 3.99 — ABNORMAL LOW
RBC: 4.07 — ABNORMAL LOW
RBC: 4.08 — ABNORMAL LOW
RBC: 4.21 — ABNORMAL LOW
RBC: 4.36
RBC: 5.04
RDW: 15.3 — ABNORMAL HIGH
RDW: 15.4 — ABNORMAL HIGH
RDW: 15.7 — ABNORMAL HIGH
RDW: 14.4 — ABNORMAL HIGH
RDW: 14.6 — ABNORMAL HIGH
RDW: 14.7 — ABNORMAL HIGH
RDW: 14.7 — ABNORMAL HIGH
RDW: 14.8 — ABNORMAL HIGH
RDW: 14.8 — ABNORMAL HIGH
RDW: 14.9 — ABNORMAL HIGH
RDW: 15.1 — ABNORMAL HIGH
WBC: 6.4
WBC: 7.2
WBC: 10.6 — ABNORMAL HIGH
WBC: 10.9 — ABNORMAL HIGH
WBC: 13.8 — ABNORMAL HIGH
WBC: 4.9
WBC: 5.7
WBC: 5.8
WBC: 6
WBC: 6.1
WBC: 7.1

## 2010-11-07 LAB — URINALYSIS, ROUTINE W REFLEX MICROSCOPIC
Bilirubin Urine: NEGATIVE
Bilirubin Urine: NEGATIVE
Glucose, UA: NEGATIVE
Glucose, UA: 250 — AB
Ketones, ur: NEGATIVE
Ketones, ur: NEGATIVE
Leukocytes, UA: NEGATIVE
Nitrite: NEGATIVE
Nitrite: NEGATIVE
Protein, ur: NEGATIVE
Protein, ur: NEGATIVE
Specific Gravity, Urine: 1.02
Specific Gravity, Urine: 1.011
Urobilinogen, UA: 0.2
Urobilinogen, UA: 0.2
pH: 5.5
pH: 5.5

## 2010-11-07 LAB — VANCOMYCIN, TROUGH: Vancomycin Tr: 23.3 — ABNORMAL HIGH

## 2010-11-07 LAB — CULTURE, BLOOD (ROUTINE X 2)
Culture: NO GROWTH
Culture: NO GROWTH

## 2010-11-07 LAB — I-STAT 8, (EC8 V) (CONVERTED LAB)
Acid-Base Excess: 3 — ABNORMAL HIGH
BUN: 15
Bicarbonate: 26.3 — ABNORMAL HIGH
Chloride: 106
Glucose, Bld: 178 — ABNORMAL HIGH
HCT: 46
Hemoglobin: 15.6
Operator id: 279831
Potassium: 3.5
Sodium: 141
TCO2: 27
pCO2, Ven: 36.2 — ABNORMAL LOW
pH, Ven: 7.469 — ABNORMAL HIGH

## 2010-11-07 LAB — CLOSTRIDIUM DIFFICILE EIA
C difficile Toxins A+B, EIA: NEGATIVE
C difficile Toxins A+B, EIA: NEGATIVE

## 2010-11-07 LAB — CK TOTAL AND CKMB (NOT AT ARMC)
CK, MB: 8.5 — ABNORMAL HIGH
Relative Index: 0.8
Total CK: 1092 — ABNORMAL HIGH

## 2010-11-07 LAB — CARDIAC PANEL(CRET KIN+CKTOT+MB+TROPI)
CK, MB: 4
Relative Index: 0.4
Total CK: 1036 — ABNORMAL HIGH
Troponin I: 0.04

## 2010-11-07 LAB — URINE CULTURE
Colony Count: NO GROWTH
Colony Count: NO GROWTH
Culture: NO GROWTH
Culture: NO GROWTH

## 2010-11-07 LAB — COMPREHENSIVE METABOLIC PANEL
ALT: 24
AST: 45 — ABNORMAL HIGH
Albumin: 3 — ABNORMAL LOW
Alkaline Phosphatase: 69
BUN: 21
CO2: 26
Calcium: 8.1 — ABNORMAL LOW
Chloride: 104
Creatinine, Ser: 1.53 — ABNORMAL HIGH
GFR calc Af Amer: 56 — ABNORMAL LOW
GFR calc non Af Amer: 46 — ABNORMAL LOW
Glucose, Bld: 188 — ABNORMAL HIGH
Potassium: 3.9
Sodium: 138
Total Bilirubin: 1.4 — ABNORMAL HIGH
Total Protein: 5.8 — ABNORMAL LOW

## 2010-11-07 LAB — TECHNOLOGIST SMEAR REVIEW

## 2010-11-07 LAB — FECAL LACTOFERRIN, QUANT: Fecal Lactoferrin: POSITIVE

## 2010-11-07 LAB — URINE MICROSCOPIC-ADD ON

## 2010-11-07 LAB — STOOL CULTURE

## 2010-11-07 LAB — LACTATE DEHYDROGENASE, ISOENZYMES
LDH 1: 19 %Total (ref 14–27)
LDH 2: 30 %Total (ref 29–42)
LDH 3: 25 %Total — ABNORMAL HIGH (ref 16–22)
LDH 4: 13 %Total (ref 8–15)
LDH 5: 13 %Total (ref 6–23)
LDH Isoenzymes, Total: 345 U/L — ABNORMAL HIGH (ref 105–230)

## 2010-11-07 LAB — D-DIMER, QUANTITATIVE: D-Dimer, Quant: 6.9 — ABNORMAL HIGH

## 2010-11-07 LAB — FIBRINOGEN: Fibrinogen: 613 — ABNORMAL HIGH

## 2010-11-07 LAB — POCT I-STAT CREATININE
Creatinine, Ser: 1
Operator id: 279831

## 2010-11-07 LAB — TSH: TSH: 3.317

## 2010-11-07 LAB — B-NATRIURETIC PEPTIDE (CONVERTED LAB): Pro B Natriuretic peptide (BNP): 76

## 2010-11-07 LAB — HEPARIN ANTIBODY SCREEN: Heparin Antibody Screen: NEGATIVE

## 2010-11-07 LAB — MAGNESIUM: Magnesium: 1.7

## 2010-11-07 LAB — EHEC TOXIN BY EIA, STOOL: EHEC Toxin by EIA: NEGATIVE

## 2010-11-07 LAB — TROPONIN I: Troponin I: 0.04

## 2010-11-07 LAB — SEDIMENTATION RATE: Sed Rate: 15

## 2010-11-07 LAB — HEMOGLOBIN A1C: Hgb A1c MFr Bld: 6.7 — ABNORMAL HIGH

## 2010-11-09 ENCOUNTER — Encounter (HOSPITAL_COMMUNITY): Payer: 59

## 2010-11-09 ENCOUNTER — Ambulatory Visit (HOSPITAL_COMMUNITY): Payer: 59

## 2010-11-11 ENCOUNTER — Encounter: Payer: Self-pay | Admitting: Nurse Practitioner

## 2010-11-11 ENCOUNTER — Other Ambulatory Visit: Payer: Self-pay | Admitting: *Deleted

## 2010-11-11 ENCOUNTER — Ambulatory Visit (INDEPENDENT_AMBULATORY_CARE_PROVIDER_SITE_OTHER)
Admission: RE | Admit: 2010-11-11 | Discharge: 2010-11-11 | Disposition: A | Discharge status: Home / Self Care | Payer: 59 | Source: Ambulatory Visit | Attending: Nurse Practitioner | Admitting: Nurse Practitioner

## 2010-11-11 ENCOUNTER — Ambulatory Visit (HOSPITAL_COMMUNITY): Payer: 59

## 2010-11-11 ENCOUNTER — Ambulatory Visit (INDEPENDENT_AMBULATORY_CARE_PROVIDER_SITE_OTHER): Payer: 59 | Admitting: Nurse Practitioner

## 2010-11-11 ENCOUNTER — Encounter (HOSPITAL_COMMUNITY): Payer: 59

## 2010-11-11 VITALS — BP 128/78 | HR 58 | Ht 67.0 in | Wt 282.4 lb

## 2010-11-11 DIAGNOSIS — R079 Chest pain, unspecified: Secondary | ICD-10-CM

## 2010-11-11 DIAGNOSIS — R0600 Dyspnea, unspecified: Secondary | ICD-10-CM

## 2010-11-11 DIAGNOSIS — R0602 Shortness of breath: Secondary | ICD-10-CM

## 2010-11-11 DIAGNOSIS — I2510 Atherosclerotic heart disease of native coronary artery without angina pectoris: Secondary | ICD-10-CM

## 2010-11-11 LAB — BASIC METABOLIC PANEL
BUN: 14 mg/dL (ref 6–23)
CO2: 31 mEq/L (ref 19–32)
Calcium: 8.3 mg/dL — ABNORMAL LOW (ref 8.4–10.5)
Chloride: 102 mEq/L (ref 96–112)
Creatinine, Ser: 0.9 mg/dL (ref 0.4–1.5)
GFR: 90.05 mL/min (ref >60.00)
Glucose, Bld: 90 mg/dL (ref 70–99)
Potassium: 3.2 mEq/L — ABNORMAL LOW (ref 3.5–5.1)
Sodium: 141 mEq/L (ref 135–145)

## 2010-11-11 LAB — BRAIN NATRIURETIC PEPTIDE: Pro B Natriuretic peptide (BNP): 82 pg/mL (ref 0.0–100.0)

## 2010-11-11 MED ORDER — IOHEXOL 350 MG/ML SOLN
100.0000 mL | Freq: Once | INTRAVENOUS | Status: AC | PRN
  Administered 2010-11-11: 100 mL via INTRAVENOUS

## 2010-11-11 NOTE — Progress Notes (Signed)
Carlos Dawson Date of Birth: [birth year] Medical Record #[id number]  History of Present Illness: Carlos Dawson is seen back today for a work in visit. He is seen for Dr. [name]. He is not feeling well. He continues to have chest tightness and shortness of breath. He says he is really no better since the PCI. We have had calls from rehab twice this week. He is not really able to exercise without symptoms. Today he had to stop. Oxygen sat was checked and was 92%. They put some oxygen on. With his history it was felt that he needed to have repeat CT of the chest. He has about 5 or 6 days of Plavix left before stopping and going back on coumadin. He has a BMS in place to the second OM. His cath was quite difficult. He does not tolerate nitrates. He has been off of his Ranexa since his PCI.   Current Outpatient Prescriptions on File Prior to Visit  Medication Sig Dispense Refill  . albuterol (VENTOLIN HFA) 108 (90 BASE) MCG/ACT inhaler Inhale 2 puffs into the lungs every 6 (six) hours as needed for wheezing.  1 Inhaler  2  . amLODipine (NORVASC) 5 MG tablet Take 1 tablet (5 mg total) by mouth 2 (two) times daily.  60 tablet  11  . aspirin 81 MG tablet Take 81 mg by mouth daily.        Marland Kitchen atenolol (TENORMIN) 25 MG tablet Take 25 mg by mouth daily.        . clopidogrel (PLAVIX) 75 MG tablet Take 75 mg by mouth daily.        . finasteride (PROSCAR) 5 MG tablet Take 5 mg by mouth daily.        . fluticasone (FLONASE) 50 MCG/ACT nasal spray Place 2 sprays into the nose as needed.       . furosemide (LASIX) 80 MG tablet Take 1 1/2 tablets two times a day  90 tablet  11  . nitroGLYCERIN (NITROSTAT) 0.4 MG SL tablet Place 0.4 mg under the tongue every 5 (five) minutes as needed.        . pantoprazole (PROTONIX) 40 MG tablet Take 1 tablet (40 mg total) by mouth daily.  30 tablet  5  . potassium chloride SA (K-DUR,KLOR-CON) 20 MEQ tablet Three tablets two times daily      . Rosuvastatin Calcium (CRESTOR PO) Take 10  mg by mouth daily.       . Tamsulosin HCl (FLOMAX) 0.4 MG CAPS Take 0.4 mg by mouth daily.        . traMADol (ULTRAM) 50 MG tablet Take 50 mg by mouth every 6 (six) hours as needed.        . valsartan (DIOVAN) 160 MG tablet Take 160 mg by mouth 2 (two) times daily.        Marland Kitchen VITAMIN D, CHOLECALCIFEROL, PO Take 4,000 mg/day by mouth daily.        Marland Kitchen DISCONTD: valsartan (DIOVAN) 160 MG tablet Take 1 tablet (160 mg total) by mouth daily.  60 tablet  11   No current facility-administered medications on file prior to visit.    Allergies  Allergen Reactions  . Adhesive (Tape)   . Morphine     REACTION: itch    Past Medical History  Diagnosis Date  . Hypertension   . Obesities, morbid   . Pulmonary embolism   . Anticoagulant long-term use   . CAD (coronary artery disease)  s/p BMS to 2nd OM in Sept 2012  . Thoracic aortic aneurysm     followed by Dr. [name]  . OA (osteoarthritis)   . LVH (left ventricular hypertrophy)   . Paroxysmal atrial fibrillation   . Colonic polyp   . OSA (obstructive sleep apnea)     PSG 03/30/97 AHI 21, BPAP 13/9  . Mild intermittent asthma   . Hemorrhoids   . Generalized headaches   . SOB (shortness of breath)   . Diastolic dysfunction   . ASCVD (arteriosclerotic cardiovascular disease)     post cath   . Sleep apnea   . GERD (gastroesophageal reflux disease)   . IBS (irritable bowel syndrome)   . Aortic root enlargement   . Ascending aortic aneurysm     recent scan showing no change  . PAF (paroxysmal atrial fibrillation)     treated with Coumadin    Past Surgical History  Procedure Date  . Hemorroidectomy   . Appendectomy   . Vasectomy   . Cervical spine surgery 06/02/2010    lower back and neck  . Gastric bypass     2009  . Cholecystectomy   . Rotator cuff repair     both  . Achilles tendon repair   . Cardiac catheterization 2006  . Coronary stent placement Sept 2012    2nd OM with BMS    History  Smoking status  . Former  Smoker -- 1.5 packs/day for 30 years  . Quit date: 01/23/1994  Smokeless tobacco  . Not on file  Comment: Stopped in 1983    History  Alcohol Use No    Family History  Problem Relation Age of Onset  . Heart disease Mother   . Emphysema Father 50  . Heart attack Sister     Review of Systems: The review of systems is positive for chest tightness and shortness of breath. He continues to have swelling despite high doses of Lasix.  All other systems were reviewed and are negative.  Physical Exam: BP 128/78  Pulse 58  Ht 5\' 7"  (1.702 m)  Wt 282 lb 6.4 oz (128.096 kg)  BMI 44.23 kg/m2 Patient is in no acute distress. He is morbidly obese. Skin is warm and dry. Color is normal.  HEENT is unremarkable. Normocephalic/atraumatic. PERRL. Sclera are nonicteric. Neck is supple. No masses. No JVD. Lungs are clear. Cardiac exam shows a regular rate and rhythm. Abdomen is soft. Extremities are full with 1+ edema. Gait and ROM are intact. No gross neurologic deficits noted.  LABORATORY DATA: CT angio of the chest shows no PE. His thoracic aorta remains dilated but unchanged. No dissection.    Assessment / Plan:

## 2010-11-11 NOTE — Assessment & Plan Note (Addendum)
This is a quite difficult situation. His CT is basically unchanged. I have restarted his Ranexa at 500 mg BID. Samples are given. I will check some labs today as well. He has about 5 to 6 days more of Plavix. I will see him back on Tuesday with Dr. [name]. After his visit today I did talk with Dr. [name] by phone. We may need to proceed with repeat cath prior to transitioning back to Coumadin.

## 2010-11-11 NOTE — Patient Instructions (Addendum)
We are going to restart the Ranexa at 500 mg BID.  I need to see you next Tuesday with Dr. [name].

## 2010-11-14 ENCOUNTER — Encounter (HOSPITAL_COMMUNITY): Payer: 59

## 2010-11-14 ENCOUNTER — Ambulatory Visit: Payer: 59 | Admitting: Nurse Practitioner

## 2010-11-14 ENCOUNTER — Ambulatory Visit (HOSPITAL_COMMUNITY): Payer: 59

## 2010-11-14 ENCOUNTER — Telehealth: Payer: Self-pay | Admitting: *Deleted

## 2010-11-14 MED ORDER — SPIRONOLACTONE 25 MG PO TABS: 25.0000 mg | ORAL_TABLET | Freq: Every day | ORAL | Status: DC

## 2010-11-14 NOTE — Telephone Encounter (Signed)
Lm w/lab results. Will send Rx for Aldactone to Pharmacy. Has an app w/Lori in AM

## 2010-11-14 NOTE — Telephone Encounter (Signed)
Message copied by Lorayne Bender on Mon Nov 14, 2010  5:39 PM ------      Message from: Rosalio Macadamia      Created: Mon Nov 14, 2010  1:14 PM       Ok to report. Labs are satisfactory but potassium remains low. Have discussed with Dr. [name]. Lets start Aldactone 25 mg daily. Will need close follow up of BMET. Recheck in one week (but may be having pre cath labs later this week).

## 2010-11-15 ENCOUNTER — Encounter: Payer: Self-pay | Admitting: Nurse Practitioner

## 2010-11-15 ENCOUNTER — Ambulatory Visit (INDEPENDENT_AMBULATORY_CARE_PROVIDER_SITE_OTHER): Payer: 59 | Admitting: Nurse Practitioner

## 2010-11-15 VITALS — BP 112/72 | HR 56 | Ht 67.0 in | Wt 281.0 lb

## 2010-11-15 DIAGNOSIS — I2510 Atherosclerotic heart disease of native coronary artery without angina pectoris: Secondary | ICD-10-CM

## 2010-11-15 LAB — CBC WITH DIFFERENTIAL/PLATELET
Basophils Absolute: 0 10*3/uL (ref 0.0–0.1)
Basophils Relative: 0.6 % (ref 0.0–3.0)
Eosinophils Absolute: 0.2 10*3/uL (ref 0.0–0.7)
Eosinophils Relative: 3.8 % (ref 0.0–5.0)
HCT: 39.3 % (ref 39.0–52.0)
Hemoglobin: 13.5 g/dL (ref 13.0–17.0)
Lymphocytes Relative: 21.4 % (ref 12.0–46.0)
Lymphs Abs: 1.3 10*3/uL (ref 0.7–4.0)
MCHC: 34.4 g/dL (ref 30.0–36.0)
MCV: 89.1 fl (ref 78.0–100.0)
Monocytes Absolute: 0.6 10*3/uL (ref 0.1–1.0)
Monocytes Relative: 9.1 % (ref 3.0–12.0)
Neutro Abs: 4 10*3/uL (ref 1.4–7.7)
Neutrophils Relative %: 65.1 % (ref 43.0–77.0)
Platelets: 148 10*3/uL — ABNORMAL LOW (ref 150.0–400.0)
RBC: 4.41 Mil/uL (ref 4.22–5.81)
RDW: 14.6 % (ref 11.5–14.6)
WBC: 6.1 10*3/uL (ref 4.5–10.5)

## 2010-11-15 LAB — BASIC METABOLIC PANEL
BUN: 16 mg/dL (ref 6–23)
CO2: 28 mEq/L (ref 19–32)
Calcium: 8.7 mg/dL (ref 8.4–10.5)
Chloride: 105 mEq/L (ref 96–112)
Creatinine, Ser: 1 mg/dL (ref 0.4–1.5)
GFR: 76.94 mL/min (ref >60.00)
Glucose, Bld: 157 mg/dL — ABNORMAL HIGH (ref 70–99)
Potassium: 3.5 mEq/L (ref 3.5–5.1)
Sodium: 141 mEq/L (ref 135–145)

## 2010-11-15 LAB — PROTIME-INR
INR: 1.3 ratio — ABNORMAL HIGH (ref 0.8–1.0)
Prothrombin Time: 14.1 s — ABNORMAL HIGH (ref 10.2–12.4)

## 2010-11-15 LAB — APTT: aPTT: 53 s — ABNORMAL HIGH (ref 21.7–28.8)

## 2010-11-15 NOTE — Assessment & Plan Note (Addendum)
He continues with chest pain and shortness of breath. His CT from Friday showed no PE and his aortic aneurysm was unchanged. He is back on Ranexa with no real improvement. We wiill proceed on with repeat catheterization per Dr. [name]. The procedure was discussed again and he is willing to proceed. I will see him back on November 2nd as already scheduled. Aldactone has been started and he will need close follow up of his potassium levels. Patient is agreeable to this plan and will call if any problems develop in the interim.

## 2010-11-15 NOTE — Progress Notes (Signed)
Carlos Dawson Date of Birth: 1940/03/23 Medical Record #[id number]  History of Present Illness: Carlos Dawson comes back in today for a follow up visit. He is seen with Dr. [name]. He had his BMS to the 2nd OM about one month ago. He continues to have chest pain and shortness of breath. He has seen very little change in his symptoms since his PCI. He does note that he had a good day on Sunday but did not feel good yesterday. He is almost finished up with his Plavix. He will have his last dose on Wednesday. We have rescanned him looking for PE which was negative. His thoracic aorta was unchanged. He has been placed back on Ranexa. Potasium level remains low and we started Aldactone last night.   Current Outpatient Prescriptions on File Prior to Visit  Medication Sig Dispense Refill  . albuterol (VENTOLIN HFA) 108 (90 BASE) MCG/ACT inhaler Inhale 2 puffs into the lungs every 6 (six) hours as needed for wheezing.  1 Inhaler  2  . amLODipine (NORVASC) 5 MG tablet Take 1 tablet (5 mg total) by mouth 2 (two) times daily.  60 tablet  11  . aspirin 81 MG tablet Take 81 mg by mouth daily.        Marland Kitchen atenolol (TENORMIN) 25 MG tablet Take 25 mg by mouth daily.        . clopidogrel (PLAVIX) 75 MG tablet Take 75 mg by mouth daily.        . finasteride (PROSCAR) 5 MG tablet Take 5 mg by mouth daily.        . fluticasone (FLONASE) 50 MCG/ACT nasal spray Place 2 sprays into the nose as needed.       . furosemide (LASIX) 80 MG tablet Take 1 1/2 tablets two times a day  90 tablet  11  . nitroGLYCERIN (NITROSTAT) 0.4 MG SL tablet Place 0.4 mg under the tongue every 5 (five) minutes as needed.        . pantoprazole (PROTONIX) 40 MG tablet Take 1 tablet (40 mg total) by mouth daily.  30 tablet  5  . potassium chloride SA (K-DUR,KLOR-CON) 20 MEQ tablet Three tablets two times daily      . ranolazine (RANEXA) 500 MG 12 hr tablet Take 500 mg by mouth 2 (two) times daily.        . Rosuvastatin Calcium (CRESTOR PO) Take 10 mg  by mouth daily.       Marland Kitchen spironolactone (ALDACTONE) 25 MG tablet Take 1 tablet (25 mg total) by mouth daily.  30 tablet  5  . Tamsulosin HCl (FLOMAX) 0.4 MG CAPS Take 0.4 mg by mouth daily.        . traMADol (ULTRAM) 50 MG tablet Take 50 mg by mouth every 6 (six) hours as needed.        . valsartan (DIOVAN) 160 MG tablet Take 160 mg by mouth 2 (two) times daily.        Marland Kitchen VITAMIN D, CHOLECALCIFEROL, PO Take 4,000 mg/day by mouth daily.        . WARFARIN SODIUM PO Take by mouth. Take as directed.       Marland Kitchen DISCONTD: valsartan (DIOVAN) 160 MG tablet Take 1 tablet (160 mg total) by mouth daily.  60 tablet  11    Allergies  Allergen Reactions  . Adhesive (Tape)   . Morphine     REACTION: itch    Past Medical History  Diagnosis Date  . Hypertension   .  Obesities, morbid   . Pulmonary embolism   . Anticoagulant long-term use   . CAD (coronary artery disease)     s/p BMS to 2nd OM in Sept 2012  . Thoracic aortic aneurysm     followed by Dr. [name]  . OA (osteoarthritis)   . LVH (left ventricular hypertrophy)   . Paroxysmal atrial fibrillation   . Colonic polyp   . OSA (obstructive sleep apnea)     PSG 03/30/97 AHI 21, BPAP 13/9  . Mild intermittent asthma   . Hemorrhoids   . Generalized headaches   . SOB (shortness of breath)   . Diastolic dysfunction   . ASCVD (arteriosclerotic cardiovascular disease)     post cath   . Sleep apnea   . GERD (gastroesophageal reflux disease)   . IBS (irritable bowel syndrome)   . Aortic root enlargement   . Ascending aortic aneurysm     recent scan showing no change  . PAF (paroxysmal atrial fibrillation)     treated with Coumadin    Past Surgical History  Procedure Date  . Hemorroidectomy   . Appendectomy   . Vasectomy   . Cervical spine surgery 06/02/2010    lower back and neck  . Gastric bypass     2009  . Cholecystectomy   . Rotator cuff repair     both  . Achilles tendon repair   . Cardiac catheterization 2006  . Coronary  stent placement Sept 2012    2nd OM with BMS    History  Smoking status  . Former Smoker -- 1.5 packs/day for 30 years  . Quit date: 01/23/1994  Smokeless tobacco  . Not on file  Comment: Stopped in 1983    History  Alcohol Use No    Family History  Problem Relation Age of Onset  . Heart disease Mother   . Emphysema Father 59  . Heart attack Sister     Review of Systems: The review of systems is positive for edema, shortness of breath and chest pain.  All other systems were reviewed and are negative.  Physical Exam: Ht 5\' 7"  (1.702 m)  Wt 281 lb (127.461 kg)  BMI 44.01 kg/m2 Patient is pleasant and in no acute distress. He is morbidly obese. Skin is warm and dry. Color is normal.  HEENT is unremarkable. Normocephalic/atraumatic. PERRL. Sclera are nonicteric. Neck is supple. No masses. No JVD. Lungs are clear. Cardiac exam shows a regular rate and rhythm. Abdomen is obese and soft. Extremities are with 1+ edema. Gait and ROM are intact. No gross neurologic deficits noted.    Lab Results  Component Value Date   WBC 5.5 10/14/2010   HGB 12.8* 10/14/2010   HCT 37.7* 10/14/2010   PLT 121* 10/14/2010   GLUCOSE 90 11/11/2010   ALT 20 05/20/2010   AST 20 05/20/2010   NA 141 11/11/2010   K 3.2* 11/11/2010   CL 102 11/11/2010   CREATININE 0.9 11/11/2010   BUN 14 11/11/2010   CO2 31 11/11/2010   TSH 1.731 06/09/2010   INR 1.20 10/13/2010   HGBA1C  Value: 6.7 (NOTE)   The ADA recommends the following therapeutic goals for glycemic   control related to Hgb A1C measurement:   Goal of Therapy:   < 7.0% Hgb A1C   Action Suggested:  > 8.0% Hgb A1C   Ref:  Diabetes Care, 22, Suppl. 1, 1999* 08/19/2006      Assessment / Plan:

## 2010-11-15 NOTE — Patient Instructions (Signed)
Stay on your current medicines.  We are going to plan for a repeat catheterization with Dr. [name] for Thursday.  You are scheduled for a cardiac catheterization on Thursday with Dr. [name].  Go to [facility]nd [facility] Short Stay on Thursday at 9:30am. No food or drink after midnight on Wednesday. You may take your medications with a sip of water on the day of your procedure.   Coronary Angiography Coronary angiography is an X-ray procedure used to look at the arteries in the heart. In this procedure, a dye is injected through a long, hollow tube (catheter). The catheter is about the size of a piece of cooked spaghetti. The catheter injects a dye into an artery in your groin. X-rays are then taken to show if there is a blockage in the arteries of your heart. BEFORE THE PROCEDURE   Let your caregiver know if you have allergies to shellfish or contrast dye. Also let your caregiver know if you have kidney problems or failure.   Do not eat or drink starting from midnight up to the time of the procedure, or as directed.   You may drink enough water to take your medications the morning of the procedure if you were instructed to do so.   You should be at the hospital or outpatient facility where the procedure is to be done 60 minutes prior to the procedure or as directed.  PROCEDURE  You may be given an IV medication to help you relax before the procedure.   You will be prepared for the procedure by washing and shaving the area where the catheter will be inserted. This is usually done in the groin but may be done in the fold of your arm by your elbow.   A medicine will be given to numb your groin where the catheter will be inserted.   A specially trained doctor will insert the catheter into an artery in your groin. The catheter is guided by using a special type of X-ray (fluoroscopy) to the blood vessel being examined.   A special dye is then injected into the catheter and X-rays are taken.  The dye helps to show where any narrowing or blockages are located in the heart arteries.  AFTER THE PROCEDURE   After the procedure you will be kept in bed lying flat for several hours. You will be instructed to not bend or cross your legs.   The groin insertion site will be watched and checked frequently.   The pulse in your feet will be checked frequently.   Additional blood tests, X-rays and an EKG may be done.   You may stay in the hospital overnight for observation.  SEEK IMMEDIATE MEDICAL CARE IF:   You develop chest pain, shortness of breath, feel faint, or pass out.   There is bleeding, swelling, or drainage from the catheter insertion site.   You develop pain, discoloration, coldness, or severe bruising in the leg or area where the catheter was inserted.   You have a fever.  Document Released: 07/16/2002 Document Revised: 09/21/2010 Document Reviewed: 09/04/2007 [facility] Patient Information 2012 [place], [place].

## 2010-11-16 ENCOUNTER — Ambulatory Visit (HOSPITAL_COMMUNITY): Payer: 59

## 2010-11-16 ENCOUNTER — Encounter (HOSPITAL_COMMUNITY): Payer: 59

## 2010-11-17 ENCOUNTER — Encounter: Payer: Self-pay | Admitting: *Deleted

## 2010-11-17 ENCOUNTER — Ambulatory Visit (HOSPITAL_COMMUNITY)
Admission: RE | Admit: 2010-11-17 | Discharge: 2010-11-17 | Disposition: A | Discharge status: Home / Self Care | Payer: 59 | Source: Ambulatory Visit | Attending: Cardiology | Admitting: Cardiology

## 2010-11-17 DIAGNOSIS — I2510 Atherosclerotic heart disease of native coronary artery without angina pectoris: Secondary | ICD-10-CM | POA: Insufficient documentation

## 2010-11-17 DIAGNOSIS — Z9861 Coronary angioplasty status: Secondary | ICD-10-CM | POA: Insufficient documentation

## 2010-11-17 DIAGNOSIS — I712 Thoracic aortic aneurysm, without rupture, unspecified: Secondary | ICD-10-CM | POA: Insufficient documentation

## 2010-11-17 DIAGNOSIS — R0609 Other forms of dyspnea: Secondary | ICD-10-CM | POA: Insufficient documentation

## 2010-11-17 DIAGNOSIS — R079 Chest pain, unspecified: Secondary | ICD-10-CM | POA: Insufficient documentation

## 2010-11-17 DIAGNOSIS — R0989 Other specified symptoms and signs involving the circulatory and respiratory systems: Secondary | ICD-10-CM | POA: Insufficient documentation

## 2010-11-17 LAB — PLATELET INHIBITION P2Y12: Platelet Function  P2Y12: 236 [PRU] (ref 194–418)

## 2010-11-18 ENCOUNTER — Encounter (HOSPITAL_COMMUNITY): Payer: 59

## 2010-11-18 ENCOUNTER — Ambulatory Visit (HOSPITAL_COMMUNITY): Payer: 59

## 2010-11-18 ENCOUNTER — Telehealth: Payer: Self-pay | Admitting: Cardiology

## 2010-11-18 NOTE — Telephone Encounter (Signed)
Had heart cath yesterday and was not sure what medication he was resume. Per Dr. Elvis Coil cath note was to continue medical therapy. He doesn't know if he is to take Plavix or Coumadin. Advised Dr. [name] is on vac and Lawson Fiscal also is not here today. Will check with Lawson Fiscal on Monday. Advised him to continue Plavix until speak with Lawson Fiscal on Monday.

## 2010-11-18 NOTE — Telephone Encounter (Signed)
He had a heart cath done yesterday and he had a question about his med please call

## 2010-11-19 NOTE — Cardiovascular Report (Signed)
  NAME:  Carlos Dawson, Carlos Dawson NO.:  [id number]  MEDICAL RECORD NO.:  [id number]  LOCATION:  [facility]                         FACILITY:  [facility]  PHYSICIAN:  Arline Ketter M. [name], M.D.  DATE OF BIRTH:  [birth year]  DATE OF PROCEDURE:  11/17/2010 DATE OF DISCHARGE:                           CARDIAC CATHETERIZATION   INDICATION FOR PROCEDURE:  The patient is a 70 year old white male who has a known thoracic aortic aneurysm measuring approximately 5 cm.  He has had recent increase in anginal symptoms and underwent stenting of large intermediate branch in September 2012.  He has had persistent symptoms of chest pain and dyspnea on exertion.  Followup CT scan showed no evidence of pulmonary emboli, stable aortic aneurysm.  Because of his ongoing symptoms, we recommend repeat cardiac catheterization.  PROCEDURES:  Coronary angiography only.  ACCESS:  Via the right femoral artery using standard Seldinger technique.  EQUIPMENT:  A 5-French 6 cm left Judkins catheter, 5-French left Amplatz 1 catheter.  CONTRAST:  100 mL of Omnipaque.  HEMODYNAMIC DATA:  Aortic pressure is 117/69 with a mean of 88 mmHg.  MEDICATIONS:  Local anesthesia, 1% Xylocaine, Versed 1 mg IV, fentanyl 25 mcg.  ANGIOGRAPHIC DATA:  The left coronary artery arises somewhat superiorly. There are essentially separate ostia for the left circumflex coronary and LAD.  The left 6 Judkins catheter selected the LAD.  We tried multiple catheters in an attempt to selectively engage the left circumflex coronary without success.  These included a left Amplatz 3 catheter and EZ RAD catheter, multipurpose catheter.  We were able to image the left circumflex distribution with the 6 cm left Judkins catheter by flush injection.  The left anterior descending artery is a small-caliber vessel that ends in the mid anterior wall.  It has minor irregularities.  At its terminus, it is very small in caliber.  Left circumflex  coronary is a large dominant vessel.  It gives rise to a large intermediate branch which supplies the apex of the myocardium. The stent in the intermediate branch is widely patent.  The remainder of the left circumflex coronary is without significant disease.  The right coronary arises anteriorly.  It is also small in caliber and is at most a codominant vessel.  It is normal.  FINAL INTERPRETATION:  No significant obstructive coronary artery disease.  There is continued excellent patency of the stent in the obtuse marginal vessel.  PLAN:  Would recommend continued medical therapy.          ______________________________ Ordean Fouts M. [name], M.D.     PMJ/MEDQ  D:  11/17/2010  T:  11/17/2010  Job:  [id number]  cc:   Sheliah Plane, MD  Electronically Signed by Aristotelis Vilardi [name] M.D. on 11/19/2010 02:26:14 PM

## 2010-11-21 ENCOUNTER — Ambulatory Visit: Payer: 59 | Admitting: Nurse Practitioner

## 2010-11-21 ENCOUNTER — Ambulatory Visit (HOSPITAL_COMMUNITY): Payer: 59

## 2010-11-21 ENCOUNTER — Other Ambulatory Visit: Payer: Self-pay | Admitting: *Deleted

## 2010-11-21 ENCOUNTER — Telehealth: Payer: Self-pay | Admitting: *Deleted

## 2010-11-21 DIAGNOSIS — I10 Essential (primary) hypertension: Secondary | ICD-10-CM

## 2010-11-21 NOTE — Telephone Encounter (Signed)
Spoke w/pt after talking w/Lori. She wants him to start Coumadin tonight and stop Plavix on Thurs. Made him a post cath app on Tues 11/6 w/Lori and to get INR checked.

## 2010-11-22 ENCOUNTER — Other Ambulatory Visit: Payer: Self-pay | Admitting: *Deleted

## 2010-11-22 ENCOUNTER — Other Ambulatory Visit (INDEPENDENT_AMBULATORY_CARE_PROVIDER_SITE_OTHER): Payer: 59 | Admitting: *Deleted

## 2010-11-22 DIAGNOSIS — I10 Essential (primary) hypertension: Secondary | ICD-10-CM

## 2010-11-22 DIAGNOSIS — I2510 Atherosclerotic heart disease of native coronary artery without angina pectoris: Secondary | ICD-10-CM

## 2010-11-22 LAB — BASIC METABOLIC PANEL
BUN: 19 mg/dL (ref 6–23)
CO2: 25 mEq/L (ref 19–32)
Calcium: 8.9 mg/dL (ref 8.4–10.5)
Chloride: 107 mEq/L (ref 96–112)
Creatinine, Ser: 1.1 mg/dL (ref 0.4–1.5)
GFR: 73.6 mL/min (ref >60.00)
Glucose, Bld: 222 mg/dL — ABNORMAL HIGH (ref 70–99)
Potassium: 3.9 mEq/L (ref 3.5–5.1)
Sodium: 139 mEq/L (ref 135–145)

## 2010-11-22 MED ORDER — RANOLAZINE ER 500 MG PO TB12: 500.0000 mg | ORAL_TABLET | Freq: Two times a day (BID) | ORAL | Status: DC

## 2010-11-23 ENCOUNTER — Ambulatory Visit (HOSPITAL_COMMUNITY): Payer: 59

## 2010-11-23 ENCOUNTER — Encounter: Payer: Self-pay | Admitting: *Deleted

## 2010-11-23 ENCOUNTER — Encounter (HOSPITAL_COMMUNITY): Payer: 59

## 2010-11-25 ENCOUNTER — Ambulatory Visit (HOSPITAL_COMMUNITY): Payer: 59

## 2010-11-25 ENCOUNTER — Encounter (HOSPITAL_COMMUNITY): Payer: 59

## 2010-11-25 ENCOUNTER — Ambulatory Visit: Payer: Medicare Other | Admitting: Nurse Practitioner

## 2010-11-28 ENCOUNTER — Ambulatory Visit (HOSPITAL_COMMUNITY): Payer: 59

## 2010-11-28 ENCOUNTER — Ambulatory Visit: Payer: 59 | Admitting: Nurse Practitioner

## 2010-11-28 ENCOUNTER — Encounter: Payer: 59 | Admitting: *Deleted

## 2010-11-28 ENCOUNTER — Encounter (HOSPITAL_COMMUNITY): Payer: 59

## 2010-11-28 DIAGNOSIS — N529 Male erectile dysfunction, unspecified: Secondary | ICD-10-CM | POA: Insufficient documentation

## 2010-11-28 DIAGNOSIS — R972 Elevated prostate specific antigen [PSA]: Secondary | ICD-10-CM | POA: Insufficient documentation

## 2010-11-29 ENCOUNTER — Ambulatory Visit (INDEPENDENT_AMBULATORY_CARE_PROVIDER_SITE_OTHER): Payer: 59 | Admitting: Nurse Practitioner

## 2010-11-29 ENCOUNTER — Encounter: Payer: Self-pay | Admitting: Nurse Practitioner

## 2010-11-29 ENCOUNTER — Ambulatory Visit (INDEPENDENT_AMBULATORY_CARE_PROVIDER_SITE_OTHER): Payer: 59 | Admitting: *Deleted

## 2010-11-29 VITALS — BP 122/72 | HR 66 | Ht 67.0 in | Wt 284.0 lb

## 2010-11-29 DIAGNOSIS — Z8672 Personal history of thrombophlebitis: Secondary | ICD-10-CM

## 2010-11-29 DIAGNOSIS — I10 Essential (primary) hypertension: Secondary | ICD-10-CM

## 2010-11-29 DIAGNOSIS — I2510 Atherosclerotic heart disease of native coronary artery without angina pectoris: Secondary | ICD-10-CM

## 2010-11-29 DIAGNOSIS — I2699 Other pulmonary embolism without acute cor pulmonale: Secondary | ICD-10-CM

## 2010-11-29 LAB — POCT INR: INR: 2.7

## 2010-11-29 NOTE — Progress Notes (Signed)
Carlos Dawson Date of Birth: 24-May-1940 Medical Record #[id number]  History of Present Illness: Carlos Dawson is seen back today for a post cath visit. He is seen for Dr. [name]. He has had repeat cath due to continued complaints of chest pain and shortness of breath. His cath showed patency. He is actually feeling better. Not sure why. We have put him back on his coumadin for his history of PAF. For some reason, he has not stopped his Plavix. He will be seeing pulmonary for follow up soon. He has apparently had some low oxygen sats only with walking on the treadmill, but not with the stepper. We have also got him on Aldactone and are titrating his potassium down. He has had BMET earlier today. Coumadin is back therapeutic. He has no real complaint.   Current Outpatient Prescriptions on File Prior to Visit  Medication Sig Dispense Refill  . albuterol (VENTOLIN HFA) 108 (90 BASE) MCG/ACT inhaler Inhale 2 puffs into the lungs every 6 (six) hours as needed for wheezing.  1 Inhaler  2  . amLODipine (NORVASC) 5 MG tablet Take 5 mg by mouth daily.        Marland Kitchen aspirin 81 MG tablet Take 81 mg by mouth daily.        Marland Kitchen atenolol (TENORMIN) 25 MG tablet Take 25 mg by mouth daily.        . finasteride (PROSCAR) 5 MG tablet Take 5 mg by mouth daily.        . fluticasone (FLONASE) 50 MCG/ACT nasal spray Place 2 sprays into the nose as needed.       . furosemide (LASIX) 80 MG tablet Take 1 1/2 tablets two times a day  90 tablet  11  . nitroGLYCERIN (NITROSTAT) 0.4 MG SL tablet Place 0.4 mg under the tongue every 5 (five) minutes as needed.        . pantoprazole (PROTONIX) 40 MG tablet Take 1 tablet (40 mg total) by mouth daily.  30 tablet  5  . ranolazine (RANEXA) 500 MG 12 hr tablet Take 1 tablet (500 mg total) by mouth 2 (two) times daily.  60 tablet  6  . Rosuvastatin Calcium (CRESTOR PO) Take 10 mg by mouth daily.       Marland Kitchen spironolactone (ALDACTONE) 25 MG tablet Take 1 tablet (25 mg total) by mouth daily.  30  tablet  5  . Tamsulosin HCl (FLOMAX) 0.4 MG CAPS Take 0.4 mg by mouth daily.        . traMADol (ULTRAM) 50 MG tablet Take 50 mg by mouth every 6 (six) hours as needed.        . valsartan (DIOVAN) 160 MG tablet Take 160 mg by mouth 2 (two) times daily.        Marland Kitchen VITAMIN D, CHOLECALCIFEROL, PO Take 4,000 mg/day by mouth daily.        Marland Kitchen DISCONTD: potassium chloride SA (K-DUR,KLOR-CON) 20 MEQ tablet Three tablets two times daily      . DISCONTD: valsartan (DIOVAN) 160 MG tablet Take 1 tablet (160 mg total) by mouth daily.  60 tablet  11    Allergies  Allergen Reactions  . Adhesive (Tape)   . Morphine     REACTION: itch    Past Medical History  Diagnosis Date  . Hypertension   . Obesities, morbid   . Pulmonary embolism   . Anticoagulant long-term use   . CAD (coronary artery disease)     s/p BMS to 2nd  OM in Sept 2012  . Thoracic aortic aneurysm     followed by Dr. [name]; last CT November 11, 2010 and unchanged  . OA (osteoarthritis)   . LVH (left ventricular hypertrophy)   . Paroxysmal atrial fibrillation   . Colonic polyp   . OSA (obstructive sleep apnea)     PSG 03/30/97 AHI 21, BPAP 13/9  . Mild intermittent asthma   . Hemorrhoids   . Generalized headaches   . SOB (shortness of breath)   . Diastolic dysfunction   . ASCVD (arteriosclerotic cardiovascular disease)     Prior BMS to the 2nd OM in September 2012; with repeat cath in October showing patency  . Sleep apnea   . GERD (gastroesophageal reflux disease)   . IBS (irritable bowel syndrome)   . Aortic root enlargement   . Ascending aortic aneurysm     recent scan showing no change  . PAF (paroxysmal atrial fibrillation)     treated with Coumadin    Past Surgical History  Procedure Date  . Hemorroidectomy   . Appendectomy   . Vasectomy   . Cervical spine surgery 06/02/2010    lower back and neck  . Gastric bypass     2009  . Cholecystectomy   . Rotator cuff repair     both  . Achilles tendon repair   .  Cardiac catheterization 2006  . Coronary stent placement Sept 2012    2nd OM with BMS  . Cardiac catheterization October 2012    Stent patent    History  Smoking status  . Former Smoker -- 1.5 packs/day for 30 years  . Quit date: 01/23/1994  Smokeless tobacco  . Not on file  Comment: Stopped in 1983    History  Alcohol Use No    Family History  Problem Relation Age of Onset  . Heart disease Mother   . Emphysema Father 49  . Heart attack Sister     Review of Systems: The review of systems is per the HPI. His shortness of breath and chest pain have improved. No problems with his groin.  All other systems were reviewed and are negative.  Physical Exam: BP 122/72  Pulse 66  Ht 5\' 7"  (1.702 m)  Wt 284 lb (128.822 kg)  BMI 44.48 kg/m2 Patient is morbidly obese and in no acute distress. Skin is warm and dry. Color is normal.  HEENT is unremarkable. Normocephalic/atraumatic. PERRL. Sclera are nonicteric. Neck is supple. No masses. No JVD. Lungs are clear. Cardiac exam shows a regular rate and rhythm. Abdomen is obese but soft. Extremities are without edema. Gait and ROM are intact. No gross neurologic deficits noted.  LABORATORY DATA: BMET is pending. INR was 2.7 today.    Assessment / Plan:

## 2010-11-29 NOTE — Patient Instructions (Addendum)
We will call you with your labs.  Stop the Plavix. Continue the coumadin.   We will see you back in about 3 months. You may return to rehab.   Call for any problems.

## 2010-11-29 NOTE — Assessment & Plan Note (Signed)
He is doing better. He will follow up with pulmonary as scheduled. We will see him back in 3 months. No change in his medicines. Labs are checked today. Will review the labs to see if potassium supplements can be cut back. Exercise and weight loss are encouraged. He may return to cardiac rehab. Patient is agreeable to this plan and will call if any problems develop in the interim.

## 2010-11-30 ENCOUNTER — Encounter (HOSPITAL_COMMUNITY): Payer: 59

## 2010-11-30 ENCOUNTER — Ambulatory Visit (HOSPITAL_COMMUNITY): Payer: 59

## 2010-11-30 ENCOUNTER — Telehealth: Payer: Self-pay | Admitting: *Deleted

## 2010-11-30 LAB — BASIC METABOLIC PANEL
BUN: 17 mg/dL (ref 6–23)
CO2: 29 mEq/L (ref 19–32)
Calcium: 8.7 mg/dL (ref 8.4–10.5)
Chloride: 105 mEq/L (ref 96–112)
Creatinine, Ser: 1.1 mg/dL (ref 0.4–1.5)
GFR: 70.51 mL/min (ref >60.00)
Glucose, Bld: 182 mg/dL — ABNORMAL HIGH (ref 70–99)
Potassium: 4 mEq/L (ref 3.5–5.1)
Sodium: 139 mEq/L (ref 135–145)

## 2010-11-30 NOTE — Telephone Encounter (Signed)
Notified of lab results. Will send copy to Dr. Jerelyn Scott

## 2010-12-01 ENCOUNTER — Telehealth (HOSPITAL_COMMUNITY): Payer: Self-pay | Admitting: *Deleted

## 2010-12-01 NOTE — Telephone Encounter (Signed)
Pt called and notified he can return to exercise per Norma Fredrickson NP.  Mr Carlos Dawson said he plans to return after his office visit with Dr Craige Cotta on 12/08/10.

## 2010-12-02 ENCOUNTER — Ambulatory Visit (HOSPITAL_COMMUNITY): Payer: 59

## 2010-12-02 ENCOUNTER — Encounter (HOSPITAL_COMMUNITY): Payer: 59

## 2010-12-05 ENCOUNTER — Encounter (HOSPITAL_COMMUNITY): Payer: 59

## 2010-12-05 ENCOUNTER — Ambulatory Visit (HOSPITAL_COMMUNITY): Payer: 59

## 2010-12-06 ENCOUNTER — Ambulatory Visit (INDEPENDENT_AMBULATORY_CARE_PROVIDER_SITE_OTHER): Payer: 59 | Admitting: *Deleted

## 2010-12-06 ENCOUNTER — Other Ambulatory Visit: Payer: Self-pay | Admitting: Nurse Practitioner

## 2010-12-06 DIAGNOSIS — Z8672 Personal history of thrombophlebitis: Secondary | ICD-10-CM

## 2010-12-06 DIAGNOSIS — Z7901 Long term (current) use of anticoagulants: Secondary | ICD-10-CM

## 2010-12-06 DIAGNOSIS — I2699 Other pulmonary embolism without acute cor pulmonale: Secondary | ICD-10-CM

## 2010-12-06 LAB — POCT INR: INR: 2.6

## 2010-12-06 MED ORDER — FUROSEMIDE 80 MG PO TABS: ORAL_TABLET | ORAL | Status: DC

## 2010-12-07 ENCOUNTER — Ambulatory Visit (HOSPITAL_COMMUNITY): Payer: 59

## 2010-12-07 ENCOUNTER — Encounter (HOSPITAL_COMMUNITY): Payer: 59

## 2010-12-08 ENCOUNTER — Ambulatory Visit (INDEPENDENT_AMBULATORY_CARE_PROVIDER_SITE_OTHER): Payer: 59 | Admitting: Pulmonary Disease

## 2010-12-08 ENCOUNTER — Telehealth: Payer: Self-pay | Admitting: Pulmonary Disease

## 2010-12-08 ENCOUNTER — Encounter: Payer: Self-pay | Admitting: Pulmonary Disease

## 2010-12-08 VITALS — BP 118/62 | HR 60 | Temp 98.1°F | Ht 67.0 in | Wt 284.6 lb

## 2010-12-08 DIAGNOSIS — R0609 Other forms of dyspnea: Secondary | ICD-10-CM

## 2010-12-08 DIAGNOSIS — J4520 Mild intermittent asthma, uncomplicated: Secondary | ICD-10-CM

## 2010-12-08 DIAGNOSIS — J45909 Unspecified asthma, uncomplicated: Secondary | ICD-10-CM

## 2010-12-08 DIAGNOSIS — I2699 Other pulmonary embolism without acute cor pulmonale: Secondary | ICD-10-CM

## 2010-12-08 DIAGNOSIS — Z23 Encounter for immunization: Secondary | ICD-10-CM

## 2010-12-08 DIAGNOSIS — R0989 Other specified symptoms and signs involving the circulatory and respiratory systems: Secondary | ICD-10-CM

## 2010-12-08 DIAGNOSIS — I712 Thoracic aortic aneurysm, without rupture: Secondary | ICD-10-CM

## 2010-12-08 DIAGNOSIS — R0902 Hypoxemia: Secondary | ICD-10-CM | POA: Insufficient documentation

## 2010-12-08 DIAGNOSIS — G4733 Obstructive sleep apnea (adult) (pediatric): Secondary | ICD-10-CM

## 2010-12-08 DIAGNOSIS — R0600 Dyspnea, unspecified: Secondary | ICD-10-CM | POA: Insufficient documentation

## 2010-12-08 NOTE — Progress Notes (Signed)
Chief Complaint  Patient presents with  . Shortness of Breath    Pt c/o increase SOB w/ exertion. Pt states he notice he was more SOB during his cardiac rehab whe he had his stent placed  1 1/5 month ago. pt states his sats would drop down into low 80's. Pt c/o wheezing and chest tightness when he is SOB. Pt would like flu shot today   CC: Peter [name], Sheliah Plane  History of Present Illness: Carlos Dawson is a 70 y.o. male former smoker with dyspnea, OSA on BPAP 13/9, and mild/intermittent asthma.  He has been getting more short of breath with exertion.  He does have intermittent episodes of wheezing.  He has been using his albuterol inhaler, and this helps.  He had trouble with his oxygen dipping into the mid 80's while walking at cardiac rehab.  He has been using his BPAP on a regular basis.  He is getting more trouble with dry mouth.  Since his last visit he had a coronary stent inserted.  He was also seen by Dr. [name] after CT chest in October.  Past Medical History  Diagnosis Date  . Hypertension   . Obesities, morbid   . Pulmonary embolism   . Anticoagulant long-term use   . CAD (coronary artery disease)     s/p BMS to 2nd OM in Sept 2012  . Thoracic aortic aneurysm     followed by Dr. [name]; last CT November 11, 2010 and unchanged  . OA (osteoarthritis)   . LVH (left ventricular hypertrophy)   . Paroxysmal atrial fibrillation   . Colonic polyp   . OSA (obstructive sleep apnea)     PSG 03/30/97 AHI 21, BPAP 13/9  . Mild intermittent asthma   . Hemorrhoids   . Generalized headaches   . SOB (shortness of breath)   . Diastolic dysfunction   . ASCVD (arteriosclerotic cardiovascular disease)     Prior BMS to the 2nd OM in September 2012; with repeat cath in October showing patency  . Sleep apnea   . GERD (gastroesophageal reflux disease)   . IBS (irritable bowel syndrome)   . Aortic root enlargement   . Ascending aortic aneurysm     recent scan showing no  change  . PAF (paroxysmal atrial fibrillation)     treated with Coumadin    Past Surgical History  Procedure Date  . Hemorroidectomy   . Appendectomy   . Vasectomy   . Cervical spine surgery 06/02/2010    lower back and neck  . Gastric bypass     2009  . Cholecystectomy   . Rotator cuff repair     both  . Achilles tendon repair   . Cardiac catheterization 2006  . Coronary stent placement Sept 2012    2nd OM with BMS  . Cardiac catheterization October 2012    Stent patent    Current Outpatient Prescriptions on File Prior to Visit  Medication Sig Dispense Refill  . albuterol (VENTOLIN HFA) 108 (90 BASE) MCG/ACT inhaler Inhale 2 puffs into the lungs every 6 (six) hours as needed for wheezing.  1 Inhaler  2  . amLODipine (NORVASC) 5 MG tablet Take 5 mg by mouth daily.        Marland Kitchen aspirin 81 MG tablet Take 81 mg by mouth daily.        Marland Kitchen atenolol (TENORMIN) 25 MG tablet Take 25 mg by mouth daily.        . finasteride (PROSCAR) 5  MG tablet Take 5 mg by mouth daily.        . fluticasone (FLONASE) 50 MCG/ACT nasal spray Place 2 sprays into the nose as needed.       . furosemide (LASIX) 80 MG tablet Take 1 1/2 tablets two times a day  270 tablet  3  . nitroGLYCERIN (NITROSTAT) 0.4 MG SL tablet Place 0.4 mg under the tongue every 5 (five) minutes as needed.        . pantoprazole (PROTONIX) 40 MG tablet Take 1 tablet (40 mg total) by mouth daily.  30 tablet  5  . potassium chloride SA (K-DUR,KLOR-CON) 20 MEQ tablet Take 40 mEq by mouth 2 (two) times daily. TWO tablets two times daily       . ranolazine (RANEXA) 500 MG 12 hr tablet Take 1 tablet (500 mg total) by mouth 2 (two) times daily.  60 tablet  6  . Rosuvastatin Calcium (CRESTOR PO) Take 10 mg by mouth daily.       Marland Kitchen spironolactone (ALDACTONE) 25 MG tablet Take 1 tablet (25 mg total) by mouth daily.  30 tablet  5  . Tamsulosin HCl (FLOMAX) 0.4 MG CAPS Take 0.4 mg by mouth daily.        . traMADol (ULTRAM) 50 MG tablet Take 50 mg by  mouth every 6 (six) hours as needed.        . valsartan (DIOVAN) 160 MG tablet Take 160 mg by mouth 2 (two) times daily.        Marland Kitchen VITAMIN D, CHOLECALCIFEROL, PO Take 4,000 mg/day by mouth daily.        Marland Kitchen DISCONTD: valsartan (DIOVAN) 160 MG tablet Take 1 tablet (160 mg total) by mouth daily.  60 tablet  11    Allergies  Allergen Reactions  . Adhesive (Tape)   . Morphine     REACTION: itch    Physical Exam:  Blood pressure 118/62, pulse 60, temperature 98.1 F (36.7 C), temperature source Oral, height 5\' 7"  (1.702 m), weight 284 lb 9.6 oz (129.094 kg), SpO2 93.00%.  General - Obese HEENT - No sinus tenderness, no oral exudate, no LAN Cardiac - s1s2 regular, no murmur Chest - normal respiratory excursion, no wheeze/rales/dullness Abdomen - obese, soft Extremities - +1 ankle edema Skin - no rashes Neurologic - normal strength Psychiatric - normal mood, behavior  Ct Angio Chest W/cm &/or Wo Cm  11/11/2010  *RADIOLOGY REPORT*  Clinical Data:  Chest pain, shortness of breath  CT ANGIOGRAPHY CHEST WITH CONTRAST  Technique:  Multidetector CT imaging of the chest was performed using the standard protocol during bolus administration of intravenous contrast.  Multiplanar CT image reconstructions including MIPs were obtained to evaluate the vascular anatomy.  Contrast: OMNIPAQUE IOHEXOL 350 MG/ML IV SOLN  Comparison:  09/08/2010  Findings: Aneurysmal dilatation ascending thoracic aorta, 5.0 x 4.9 cm image 39. No evidence of aortic dissection. Coronary arterial calcifications noted. Pulmonary arteries patent. No evidence of pulmonary embolism. No thoracic adenopathy. Visualized portion of upper abdomen unremarkable. Post gastric laparoscopic banding surgery. Scattered end plate spur formation thoracic spine. Minimal bibasilar atelectasis. Lungs otherwise clear. No pleural effusion or pneumothorax.  Review of the MIP images confirms the above findings.  IMPRESSION: No evidence of pulmonary  embolism. Persistent aneurysmal dilatation of ascending thoracic aorta, 5.0 x 4.9 cm in size, not significantly change since previous exam. Minimal dependent atelectasis in both lungs.  Original Report Authenticated By: Lollie Marrow, M.D.   Spirometry 12/08/10>> FEV1 1.95(62 ),  FEV1% 77.   Assessment/Plan:  OBSTRUCTIVE SLEEP APNEA Will get download from his BPAP machine to determine if he needs adjustment to his settings.  Hypoxemia He had oxygen desaturation with exertion.  Will arrange for home oxygen at 2 liters with exertion.  He will need to have ONO done with BPAP to determine if he needs oxygen at night also.  Thoracic aortic aneurysm He is followed by [facility].  Pulmonary embolism Recent CT chest did not show evidence for PE.  Mild intermittent asthma Will continue prn albuterol.  Will assess whether he maintenance inhaler therapy after review of his PFT.  Dyspnea He has increased dyspnea.  Some of this is likely related to exertional hypoxemia.  He also has a component of deconditioning, and encouraged him to keep up with cardiac rehab.  His spirometry showed restrictive defect likely related to his weight.  His recent CT chest was relatively unremarkable for a cause of his dyspnea.  He is to continue his prn asthma medications for now.  Will further assess with PFT.     Outpatient Encounter Prescriptions as of 12/08/2010  Medication Sig Dispense Refill  . albuterol (VENTOLIN HFA) 108 (90 BASE) MCG/ACT inhaler Inhale 2 puffs into the lungs every 6 (six) hours as needed for wheezing.  1 Inhaler  2  . amLODipine (NORVASC) 5 MG tablet Take 5 mg by mouth daily.        Marland Kitchen aspirin 81 MG tablet Take 81 mg by mouth daily.        Marland Kitchen atenolol (TENORMIN) 25 MG tablet Take 25 mg by mouth daily.        . finasteride (PROSCAR) 5 MG tablet Take 5 mg by mouth daily.        . fluticasone (FLONASE) 50 MCG/ACT nasal spray Place 2 sprays into the nose as needed.       . furosemide (LASIX) 80 MG  tablet Take 1 1/2 tablets two times a day  270 tablet  3  . nitroGLYCERIN (NITROSTAT) 0.4 MG SL tablet Place 0.4 mg under the tongue every 5 (five) minutes as needed.        . pantoprazole (PROTONIX) 40 MG tablet Take 1 tablet (40 mg total) by mouth daily.  30 tablet  5  . potassium chloride SA (K-DUR,KLOR-CON) 20 MEQ tablet Take 40 mEq by mouth 2 (two) times daily. TWO tablets two times daily       . ranolazine (RANEXA) 500 MG 12 hr tablet Take 1 tablet (500 mg total) by mouth 2 (two) times daily.  60 tablet  6  . Rosuvastatin Calcium (CRESTOR PO) Take 10 mg by mouth daily.       Marland Kitchen spironolactone (ALDACTONE) 25 MG tablet Take 1 tablet (25 mg total) by mouth daily.  30 tablet  5  . Tamsulosin HCl (FLOMAX) 0.4 MG CAPS Take 0.4 mg by mouth daily.        . traMADol (ULTRAM) 50 MG tablet Take 50 mg by mouth every 6 (six) hours as needed.        . valsartan (DIOVAN) 160 MG tablet Take 160 mg by mouth 2 (two) times daily.        Marland Kitchen VITAMIN D, CHOLECALCIFEROL, PO Take 4,000 mg/day by mouth daily.          Carlos Dawson Pager:  [phone] 12/08/2010, 3:44 PM

## 2010-12-08 NOTE — Progress Notes (Signed)
Addended by: Tommie Sams on: 12/08/2010 03:52 PM   Modules accepted: Orders

## 2010-12-08 NOTE — Assessment & Plan Note (Signed)
Will get download from his BPAP machine to determine if he needs adjustment to his settings.

## 2010-12-08 NOTE — Assessment & Plan Note (Signed)
He has increased dyspnea.  Some of this is likely related to exertional hypoxemia.  He also has a component of deconditioning, and encouraged him to keep up with cardiac rehab.  His spirometry showed restrictive defect likely related to his weight.  His recent CT chest was relatively unremarkable for a cause of his dyspnea.  He is to continue his prn asthma medications for now.  Will further assess with PFT.

## 2010-12-08 NOTE — Patient Instructions (Signed)
Will schedule breathing test (PFT) Will get report from BPAP machine Will set up 2 liters oxygen with exertion Will arrange for oxygen test at night with BPAP Flu shot today Continue ventolin two puffs as needed for cough, wheeze, chest congestion, or shortness of breath Follow up in 4 weeks

## 2010-12-08 NOTE — Assessment & Plan Note (Signed)
Recent CT chest did not show evidence for PE.

## 2010-12-08 NOTE — Assessment & Plan Note (Signed)
He is followed by [facility].

## 2010-12-08 NOTE — Assessment & Plan Note (Signed)
Will continue prn albuterol.  Will assess whether he maintenance inhaler therapy after review of his PFT.

## 2010-12-08 NOTE — Assessment & Plan Note (Signed)
He had oxygen desaturation with exertion.  Will arrange for home oxygen at 2 liters with exertion.  He will need to have ONO done with BPAP to determine if he needs oxygen at night also.

## 2010-12-08 NOTE — Telephone Encounter (Signed)
Will forward to [facility]. 

## 2010-12-09 ENCOUNTER — Encounter (HOSPITAL_COMMUNITY): Payer: 59

## 2010-12-09 ENCOUNTER — Ambulatory Visit (HOSPITAL_COMMUNITY): Payer: 59

## 2010-12-09 NOTE — Telephone Encounter (Signed)
Info given to [email or web address]  to set 02 qualified

## 2010-12-12 ENCOUNTER — Encounter (HOSPITAL_COMMUNITY): Payer: 59

## 2010-12-12 ENCOUNTER — Ambulatory Visit (HOSPITAL_COMMUNITY): Payer: 59

## 2010-12-13 ENCOUNTER — Other Ambulatory Visit: Payer: Self-pay | Admitting: Cardiology

## 2010-12-14 ENCOUNTER — Encounter (HOSPITAL_COMMUNITY): Payer: 59

## 2010-12-14 ENCOUNTER — Ambulatory Visit (HOSPITAL_COMMUNITY): Payer: 59

## 2010-12-16 ENCOUNTER — Ambulatory Visit (HOSPITAL_COMMUNITY): Payer: 59

## 2010-12-16 ENCOUNTER — Ambulatory Visit (INDEPENDENT_AMBULATORY_CARE_PROVIDER_SITE_OTHER): Payer: 59 | Admitting: *Deleted

## 2010-12-16 DIAGNOSIS — Z8672 Personal history of thrombophlebitis: Secondary | ICD-10-CM

## 2010-12-16 DIAGNOSIS — I2699 Other pulmonary embolism without acute cor pulmonale: Secondary | ICD-10-CM

## 2010-12-16 DIAGNOSIS — Z7901 Long term (current) use of anticoagulants: Secondary | ICD-10-CM

## 2010-12-16 LAB — POCT INR: INR: 2.6

## 2010-12-19 ENCOUNTER — Ambulatory Visit (HOSPITAL_COMMUNITY): Payer: 59

## 2010-12-19 ENCOUNTER — Encounter (HOSPITAL_COMMUNITY): Payer: 59

## 2010-12-20 ENCOUNTER — Telehealth: Payer: Self-pay | Admitting: Pulmonary Disease

## 2010-12-20 ENCOUNTER — Ambulatory Visit (INDEPENDENT_AMBULATORY_CARE_PROVIDER_SITE_OTHER): Payer: 59 | Admitting: Pulmonary Disease

## 2010-12-20 ENCOUNTER — Encounter: Payer: Self-pay | Admitting: Pulmonary Disease

## 2010-12-20 DIAGNOSIS — R0989 Other specified symptoms and signs involving the circulatory and respiratory systems: Secondary | ICD-10-CM

## 2010-12-20 DIAGNOSIS — R0600 Dyspnea, unspecified: Secondary | ICD-10-CM

## 2010-12-20 DIAGNOSIS — R0609 Other forms of dyspnea: Secondary | ICD-10-CM

## 2010-12-20 LAB — PULMONARY FUNCTION TEST

## 2010-12-20 NOTE — Telephone Encounter (Signed)
ONO with BPAP and room air 12/12/10>>Test time 6 hrs 56 min.  Basal SpO2 93.1%, low SpO2 86%.  Spent 2.8 min with SpO2 < 88%.  Will have my nurse inform patient that ONO looked good while using BPAP.  He is to continue using oxygen with exertion during the day, but does not need to use oxygen while asleep provided he uses his BPAP.

## 2010-12-20 NOTE — Progress Notes (Signed)
PFT done today. 

## 2010-12-20 NOTE — Telephone Encounter (Signed)
[email or web address]  

## 2010-12-20 NOTE — Telephone Encounter (Signed)
I spoke with patient about results and he verbalized understanding and had no questions 

## 2010-12-20 NOTE — Telephone Encounter (Signed)
Duplicate message. 

## 2010-12-21 ENCOUNTER — Ambulatory Visit (HOSPITAL_COMMUNITY): Payer: 59

## 2010-12-21 ENCOUNTER — Encounter (HOSPITAL_COMMUNITY): Payer: 59

## 2010-12-22 ENCOUNTER — Telehealth: Payer: Self-pay | Admitting: Pulmonary Disease

## 2010-12-22 ENCOUNTER — Encounter: Payer: Self-pay | Admitting: Pulmonary Disease

## 2010-12-22 NOTE — Telephone Encounter (Signed)
PFT 12/20/10>>FEV1 2.13(79%), FEV1% 71, TLC 5.80(100%), DLCO 57%, no BD.  Will have my nurse inform patient that PFT shows mild, expected changes from history of asthma.  No change to current inhaler regimen.

## 2010-12-23 ENCOUNTER — Encounter (HOSPITAL_COMMUNITY): Payer: 59

## 2010-12-23 ENCOUNTER — Ambulatory Visit (HOSPITAL_COMMUNITY): Payer: 59

## 2010-12-23 ENCOUNTER — Encounter (INDEPENDENT_AMBULATORY_CARE_PROVIDER_SITE_OTHER): Payer: 59 | Admitting: Surgery

## 2010-12-23 NOTE — Telephone Encounter (Signed)
[email or web address] x1 

## 2010-12-23 NOTE — Telephone Encounter (Signed)
I spoke with pt and he is aware of his PFT results. Pt verbalized understanding and had no questions

## 2010-12-26 ENCOUNTER — Encounter (HOSPITAL_COMMUNITY)
Admission: RE | Admit: 2010-12-26 | Discharge: 2010-12-26 | Disposition: A | Discharge status: Home / Self Care | Payer: 59 | Source: Ambulatory Visit | Attending: Cardiology | Admitting: Cardiology

## 2010-12-26 ENCOUNTER — Ambulatory Visit (HOSPITAL_COMMUNITY): Payer: 59

## 2010-12-26 DIAGNOSIS — Z5189 Encounter for other specified aftercare: Secondary | ICD-10-CM | POA: Insufficient documentation

## 2010-12-26 DIAGNOSIS — Z9861 Coronary angioplasty status: Secondary | ICD-10-CM | POA: Insufficient documentation

## 2010-12-26 DIAGNOSIS — I714 Abdominal aortic aneurysm, without rupture, unspecified: Secondary | ICD-10-CM | POA: Insufficient documentation

## 2010-12-26 DIAGNOSIS — Z9884 Bariatric surgery status: Secondary | ICD-10-CM | POA: Insufficient documentation

## 2010-12-26 DIAGNOSIS — I2510 Atherosclerotic heart disease of native coronary artery without angina pectoris: Secondary | ICD-10-CM | POA: Insufficient documentation

## 2010-12-26 DIAGNOSIS — I10 Essential (primary) hypertension: Secondary | ICD-10-CM | POA: Insufficient documentation

## 2010-12-26 DIAGNOSIS — K219 Gastro-esophageal reflux disease without esophagitis: Secondary | ICD-10-CM | POA: Insufficient documentation

## 2010-12-26 DIAGNOSIS — I4891 Unspecified atrial fibrillation: Secondary | ICD-10-CM | POA: Insufficient documentation

## 2010-12-26 DIAGNOSIS — Z7901 Long term (current) use of anticoagulants: Secondary | ICD-10-CM | POA: Insufficient documentation

## 2010-12-26 DIAGNOSIS — G4733 Obstructive sleep apnea (adult) (pediatric): Secondary | ICD-10-CM | POA: Insufficient documentation

## 2010-12-26 DIAGNOSIS — Z86718 Personal history of other venous thrombosis and embolism: Secondary | ICD-10-CM | POA: Insufficient documentation

## 2010-12-28 ENCOUNTER — Ambulatory Visit (HOSPITAL_COMMUNITY): Payer: 59

## 2010-12-28 ENCOUNTER — Encounter (HOSPITAL_COMMUNITY)
Admission: RE | Admit: 2010-12-28 | Discharge: 2010-12-28 | Disposition: A | Discharge status: Home / Self Care | Payer: 59 | Source: Ambulatory Visit | Attending: Cardiology | Admitting: Cardiology

## 2010-12-28 LAB — GLUCOSE, CAPILLARY: Glucose-Capillary: 118 mg/dL — ABNORMAL HIGH (ref 70–99)

## 2010-12-30 ENCOUNTER — Ambulatory Visit (HOSPITAL_COMMUNITY): Payer: 59

## 2010-12-30 ENCOUNTER — Encounter (HOSPITAL_COMMUNITY)
Admission: RE | Admit: 2010-12-30 | Discharge: 2010-12-30 | Disposition: A | Discharge status: Home / Self Care | Payer: 59 | Source: Ambulatory Visit | Attending: Cardiology | Admitting: Cardiology

## 2011-01-02 ENCOUNTER — Encounter (HOSPITAL_COMMUNITY)
Admission: RE | Admit: 2011-01-02 | Discharge: 2011-01-02 | Disposition: A | Discharge status: Home / Self Care | Payer: 59 | Source: Ambulatory Visit | Attending: Cardiology | Admitting: Cardiology

## 2011-01-02 ENCOUNTER — Encounter: Payer: Self-pay | Admitting: Pulmonary Disease

## 2011-01-02 ENCOUNTER — Ambulatory Visit (HOSPITAL_COMMUNITY): Payer: 59

## 2011-01-04 ENCOUNTER — Encounter (HOSPITAL_COMMUNITY): Payer: 59

## 2011-01-04 ENCOUNTER — Ambulatory Visit (HOSPITAL_COMMUNITY): Payer: 59

## 2011-01-05 ENCOUNTER — Encounter: Payer: Self-pay | Admitting: Pulmonary Disease

## 2011-01-05 ENCOUNTER — Ambulatory Visit (INDEPENDENT_AMBULATORY_CARE_PROVIDER_SITE_OTHER): Payer: 59 | Admitting: Pulmonary Disease

## 2011-01-05 VITALS — BP 112/64 | HR 66 | Temp 97.9°F | Ht 67.0 in | Wt 288.0 lb

## 2011-01-05 DIAGNOSIS — J4520 Mild intermittent asthma, uncomplicated: Secondary | ICD-10-CM

## 2011-01-05 DIAGNOSIS — R0902 Hypoxemia: Secondary | ICD-10-CM

## 2011-01-05 DIAGNOSIS — J45909 Unspecified asthma, uncomplicated: Secondary | ICD-10-CM

## 2011-01-05 DIAGNOSIS — G4733 Obstructive sleep apnea (adult) (pediatric): Secondary | ICD-10-CM

## 2011-01-05 MED ORDER — BECLOMETHASONE DIPROPIONATE 80 MCG/ACT IN AERS: 2.0000 | INHALATION_SPRAY | Freq: Two times a day (BID) | RESPIRATORY_TRACT | Status: DC

## 2011-01-05 NOTE — Assessment & Plan Note (Signed)
He is to continue 2 liters oxygen with exertion.  Will re-assess oxygen needs at next visit.

## 2011-01-05 NOTE — Assessment & Plan Note (Signed)
He has increase in his symptoms with recent URI.  I don't think he needs antibiotics, prednisone, or chest xray at this time.  Will have him use Qvar two puffs bid for one week, and continue ventolin prn.  He is to call if he feels he needs to use Qvar longer.

## 2011-01-05 NOTE — Progress Notes (Signed)
Chief Complaint  Patient presents with  . Follow-up    Pt states her breathing is getting better, pt still wheeze in the mornings when he wakes up. pt c/o cough w/ yellow phlem, chest tightness, nasal congestion, PND, runny nose, sore throat (worse at night) x 2 weeks  . Sleep Apnea    Pt states he wears his bpap everynight x 6 hrs a night. Pt denies any problems ith mask/machine    History of Present Illness: Carlos Dawson is a 70 y.o. male former smoker with dyspnea, OSA on BPAP 13/9, and mild/intermittent asthma  He has developed a URI over the past two weeks.  He has been getting more cough, wheeze, and chest congestion.  He has sinus congestion with post-nasal drip.  As a result he has been using his ventolin 2 to 3 times per day.    Prior to this his breathing was okay, and he was not needing to use inhalers much.  ONO with BPAP and room air 12/12/10>>Test time 6 hrs 56 min. Basal SpO2 93.1%, low SpO2 86%. Spent 2.8 min with SpO2 < 88%.  PFT 12/20/10>>FEV1 2.13(79%), FEV1% 71, TLC 5.80(100%), DLCO 57%, no BD.  Past Medical History  Diagnosis Date  . Hypertension   . Obesities, morbid   . Pulmonary embolism   . Anticoagulant long-term use   . CAD (coronary artery disease)     s/p BMS to 2nd OM in Sept 2012  . Thoracic aortic aneurysm     followed by Dr. [name]; last CT November 11, 2010 and unchanged  . OA (osteoarthritis)   . LVH (left ventricular hypertrophy)   . Paroxysmal atrial fibrillation   . Colonic polyp   . OSA (obstructive sleep apnea)     PSG 03/30/97 AHI 21, BPAP 13/9  . Mild intermittent asthma   . Hemorrhoids   . Generalized headaches   . SOB (shortness of breath)   . Diastolic dysfunction   . ASCVD (arteriosclerotic cardiovascular disease)     Prior BMS to the 2nd OM in September 2012; with repeat cath in October showing patency  . Sleep apnea   . GERD (gastroesophageal reflux disease)   . IBS (irritable bowel syndrome)   . Aortic root enlargement    . Ascending aortic aneurysm     recent scan showing no change  . PAF (paroxysmal atrial fibrillation)     treated with Coumadin    Past Surgical History  Procedure Date  . Hemorroidectomy   . Appendectomy   . Vasectomy   . Cervical spine surgery 06/02/2010    lower back and neck  . Gastric bypass     2009  . Cholecystectomy   . Rotator cuff repair     both  . Achilles tendon repair   . Cardiac catheterization 2006  . Coronary stent placement Sept 2012    2nd OM with BMS  . Cardiac catheterization October 2012    Stent patent    Current Outpatient Prescriptions on File Prior to Visit  Medication Sig Dispense Refill  . albuterol (VENTOLIN HFA) 108 (90 BASE) MCG/ACT inhaler Inhale 2 puffs into the lungs every 6 (six) hours as needed for wheezing.  1 Inhaler  2  . amLODipine (NORVASC) 5 MG tablet Take 5 mg by mouth daily.        Marland Kitchen aspirin 81 MG tablet Take 81 mg by mouth daily.        Marland Kitchen atenolol (TENORMIN) 25 MG tablet Take 25 mg by  mouth daily.        . finasteride (PROSCAR) 5 MG tablet Take 5 mg by mouth daily.        . fluticasone (FLONASE) 50 MCG/ACT nasal spray Place 2 sprays into the nose as needed.       . furosemide (LASIX) 80 MG tablet Take 1 1/2 tablets two times a day  270 tablet  3  . KLOR-CON M20 20 MEQ tablet TAKE 2 TABLETS BY MOUTH TWICE A DAY  120 tablet  5  . nitroGLYCERIN (NITROSTAT) 0.4 MG SL tablet Place 0.4 mg under the tongue every 5 (five) minutes as needed.        . pantoprazole (PROTONIX) 40 MG tablet Take 1 tablet (40 mg total) by mouth daily.  30 tablet  5  . ranolazine (RANEXA) 500 MG 12 hr tablet Take 1 tablet (500 mg total) by mouth 2 (two) times daily.  60 tablet  6  . Rosuvastatin Calcium (CRESTOR PO) Take 10 mg by mouth daily.       Marland Kitchen spironolactone (ALDACTONE) 25 MG tablet Take 1 tablet (25 mg total) by mouth daily.  30 tablet  5  . Tamsulosin HCl (FLOMAX) 0.4 MG CAPS Take 0.4 mg by mouth daily.        . traMADol (ULTRAM) 50 MG tablet Take 50  mg by mouth every 6 (six) hours as needed.        . valsartan (DIOVAN) 160 MG tablet Take 160 mg by mouth 2 (two) times daily.        Marland Kitchen VITAMIN D, CHOLECALCIFEROL, PO Take 4,000 mg/day by mouth daily.        Marland Kitchen DISCONTD: valsartan (DIOVAN) 160 MG tablet Take 1 tablet (160 mg total) by mouth daily.  60 tablet  11    Allergies  Allergen Reactions  . Adhesive (Tape)   . Morphine     REACTION: itch    Physical Exam:  Blood pressure 112/64, pulse 66, temperature 97.9 F (36.6 C), temperature source Oral, height 5\' 7"  (1.702 m), weight 288 lb (130.636 kg), SpO2 96.00%.  General - Obese  HEENT - No sinus tenderness, no oral exudate, no LAN  Cardiac - s1s2 regular, no murmur  Chest - normal respiratory excursion, no wheeze/rales/dullness  Abdomen - obese, soft  Extremities - +1 ankle edema  Skin - no rashes  Neurologic - normal strength  Psychiatric - normal mood, behavior  Assessment/Plan:  Outpatient Encounter Prescriptions as of 01/05/2011  Medication Sig Dispense Refill  . albuterol (VENTOLIN HFA) 108 (90 BASE) MCG/ACT inhaler Inhale 2 puffs into the lungs every 6 (six) hours as needed for wheezing.  1 Inhaler  2  . amLODipine (NORVASC) 5 MG tablet Take 5 mg by mouth daily.        Marland Kitchen aspirin 81 MG tablet Take 81 mg by mouth daily.        Marland Kitchen atenolol (TENORMIN) 25 MG tablet Take 25 mg by mouth daily.        . finasteride (PROSCAR) 5 MG tablet Take 5 mg by mouth daily.        . fluticasone (FLONASE) 50 MCG/ACT nasal spray Place 2 sprays into the nose as needed.       . furosemide (LASIX) 80 MG tablet Take 1 1/2 tablets two times a day  270 tablet  3  . KLOR-CON M20 20 MEQ tablet TAKE 2 TABLETS BY MOUTH TWICE A DAY  120 tablet  5  . nitroGLYCERIN (NITROSTAT) 0.4 MG  SL tablet Place 0.4 mg under the tongue every 5 (five) minutes as needed.        . NON FORMULARY Nettie Pot morning and night       . pantoprazole (PROTONIX) 40 MG tablet Take 1 tablet (40 mg total) by mouth daily.  30  tablet  5  . ranolazine (RANEXA) 500 MG 12 hr tablet Take 1 tablet (500 mg total) by mouth 2 (two) times daily.  60 tablet  6  . Rosuvastatin Calcium (CRESTOR PO) Take 10 mg by mouth daily.       Marland Kitchen spironolactone (ALDACTONE) 25 MG tablet Take 1 tablet (25 mg total) by mouth daily.  30 tablet  5  . Tamsulosin HCl (FLOMAX) 0.4 MG CAPS Take 0.4 mg by mouth daily.        . traMADol (ULTRAM) 50 MG tablet Take 50 mg by mouth every 6 (six) hours as needed.        . valsartan (DIOVAN) 160 MG tablet Take 160 mg by mouth 2 (two) times daily.        Marland Kitchen VITAMIN D, CHOLECALCIFEROL, PO Take 4,000 mg/day by mouth daily.          Salar Molden Pager:  [phone] 01/05/2011, 2:34 PM

## 2011-01-05 NOTE — Assessment & Plan Note (Signed)
He is doing well with BPAP.  He is compliant and reports benefit.

## 2011-01-05 NOTE — Patient Instructions (Signed)
Qvar two puffs twice per day for one week, then as needed.  Rinse mouth after using Qvar Follow up in 6 months

## 2011-01-06 ENCOUNTER — Ambulatory Visit (INDEPENDENT_AMBULATORY_CARE_PROVIDER_SITE_OTHER): Payer: 59 | Admitting: *Deleted

## 2011-01-06 ENCOUNTER — Ambulatory Visit (HOSPITAL_COMMUNITY): Payer: 59

## 2011-01-06 ENCOUNTER — Encounter (INDEPENDENT_AMBULATORY_CARE_PROVIDER_SITE_OTHER): Payer: Self-pay | Admitting: Surgery

## 2011-01-06 ENCOUNTER — Encounter (INDEPENDENT_AMBULATORY_CARE_PROVIDER_SITE_OTHER): Payer: 59

## 2011-01-06 ENCOUNTER — Encounter (HOSPITAL_COMMUNITY): Payer: 59

## 2011-01-06 DIAGNOSIS — I2699 Other pulmonary embolism without acute cor pulmonale: Secondary | ICD-10-CM

## 2011-01-06 DIAGNOSIS — Z7901 Long term (current) use of anticoagulants: Secondary | ICD-10-CM

## 2011-01-06 DIAGNOSIS — Z8672 Personal history of thrombophlebitis: Secondary | ICD-10-CM

## 2011-01-06 LAB — POCT INR: INR: 3.6

## 2011-01-09 ENCOUNTER — Ambulatory Visit (HOSPITAL_COMMUNITY): Payer: 59

## 2011-01-09 ENCOUNTER — Encounter (HOSPITAL_COMMUNITY): Payer: 59

## 2011-01-11 ENCOUNTER — Ambulatory Visit (HOSPITAL_COMMUNITY): Payer: 59

## 2011-01-11 ENCOUNTER — Encounter (HOSPITAL_COMMUNITY): Payer: 59

## 2011-01-13 ENCOUNTER — Ambulatory Visit (HOSPITAL_COMMUNITY): Payer: 59

## 2011-01-13 ENCOUNTER — Telehealth: Payer: Self-pay | Admitting: Cardiology

## 2011-01-13 ENCOUNTER — Encounter (HOSPITAL_COMMUNITY): Payer: 59

## 2011-01-13 NOTE — Telephone Encounter (Signed)
New Problem:   Patient would like to have his last few OV's faxed over to the [place] (Dr. Rondel Baton fax: [phone])so they can accurately refill his prescription of furosemide (LASIX) 80 MG tablet.

## 2011-01-16 ENCOUNTER — Ambulatory Visit (HOSPITAL_COMMUNITY): Payer: 59

## 2011-01-18 ENCOUNTER — Ambulatory Visit (HOSPITAL_COMMUNITY): Payer: 59

## 2011-01-18 ENCOUNTER — Encounter (HOSPITAL_COMMUNITY): Payer: 59

## 2011-01-20 ENCOUNTER — Ambulatory Visit (HOSPITAL_COMMUNITY): Payer: 59

## 2011-01-20 ENCOUNTER — Encounter (HOSPITAL_COMMUNITY): Payer: 59

## 2011-01-23 ENCOUNTER — Ambulatory Visit (HOSPITAL_COMMUNITY): Payer: 59

## 2011-01-23 ENCOUNTER — Encounter (HOSPITAL_COMMUNITY): Payer: 59

## 2011-01-25 ENCOUNTER — Encounter (HOSPITAL_COMMUNITY): Payer: 59

## 2011-01-25 ENCOUNTER — Ambulatory Visit (HOSPITAL_COMMUNITY): Payer: 59

## 2011-01-27 ENCOUNTER — Encounter (HOSPITAL_COMMUNITY): Payer: 59

## 2011-01-27 ENCOUNTER — Other Ambulatory Visit: Payer: Self-pay | Admitting: *Deleted

## 2011-01-27 ENCOUNTER — Ambulatory Visit (HOSPITAL_COMMUNITY): Payer: 59

## 2011-01-27 ENCOUNTER — Ambulatory Visit (INDEPENDENT_AMBULATORY_CARE_PROVIDER_SITE_OTHER): Payer: 59 | Admitting: *Deleted

## 2011-01-27 DIAGNOSIS — I2699 Other pulmonary embolism without acute cor pulmonale: Secondary | ICD-10-CM | POA: Diagnosis not present

## 2011-01-27 DIAGNOSIS — Z7901 Long term (current) use of anticoagulants: Secondary | ICD-10-CM | POA: Diagnosis not present

## 2011-01-27 DIAGNOSIS — Z8672 Personal history of thrombophlebitis: Secondary | ICD-10-CM | POA: Diagnosis not present

## 2011-01-27 LAB — POCT INR: INR: 3.9

## 2011-01-27 MED ORDER — FUROSEMIDE 80 MG PO TABS: ORAL_TABLET | ORAL | Status: DC

## 2011-01-27 NOTE — Telephone Encounter (Signed)
Patient came for labs and requested Lasix be sent to [facility] until he gets from [place]

## 2011-01-30 ENCOUNTER — Ambulatory Visit (HOSPITAL_COMMUNITY): Payer: 59

## 2011-01-30 ENCOUNTER — Encounter (HOSPITAL_COMMUNITY): Payer: 59

## 2011-02-01 ENCOUNTER — Encounter (HOSPITAL_COMMUNITY): Admission: RE | Admit: 2011-02-01 | Payer: 59 | Source: Ambulatory Visit

## 2011-02-01 ENCOUNTER — Ambulatory Visit (HOSPITAL_COMMUNITY): Payer: 59

## 2011-02-02 DIAGNOSIS — L259 Unspecified contact dermatitis, unspecified cause: Secondary | ICD-10-CM | POA: Diagnosis not present

## 2011-02-02 DIAGNOSIS — D235 Other benign neoplasm of skin of trunk: Secondary | ICD-10-CM | POA: Diagnosis not present

## 2011-02-03 ENCOUNTER — Encounter (HOSPITAL_COMMUNITY): Payer: 59

## 2011-02-03 ENCOUNTER — Encounter (INDEPENDENT_AMBULATORY_CARE_PROVIDER_SITE_OTHER): Payer: 59

## 2011-02-03 ENCOUNTER — Ambulatory Visit (HOSPITAL_COMMUNITY): Payer: 59

## 2011-02-06 ENCOUNTER — Ambulatory Visit (HOSPITAL_COMMUNITY): Payer: 59

## 2011-02-06 ENCOUNTER — Encounter (HOSPITAL_COMMUNITY)
Admission: RE | Admit: 2011-02-06 | Discharge: 2011-02-06 | Disposition: A | Discharge status: Home / Self Care | Payer: 59 | Source: Ambulatory Visit | Attending: Cardiology | Admitting: Cardiology

## 2011-02-06 DIAGNOSIS — Z9861 Coronary angioplasty status: Secondary | ICD-10-CM | POA: Insufficient documentation

## 2011-02-06 DIAGNOSIS — I714 Abdominal aortic aneurysm, without rupture, unspecified: Secondary | ICD-10-CM | POA: Insufficient documentation

## 2011-02-06 DIAGNOSIS — I4891 Unspecified atrial fibrillation: Secondary | ICD-10-CM | POA: Insufficient documentation

## 2011-02-06 DIAGNOSIS — G4733 Obstructive sleep apnea (adult) (pediatric): Secondary | ICD-10-CM | POA: Insufficient documentation

## 2011-02-06 DIAGNOSIS — Z5189 Encounter for other specified aftercare: Secondary | ICD-10-CM | POA: Insufficient documentation

## 2011-02-06 DIAGNOSIS — K219 Gastro-esophageal reflux disease without esophagitis: Secondary | ICD-10-CM | POA: Insufficient documentation

## 2011-02-06 DIAGNOSIS — I10 Essential (primary) hypertension: Secondary | ICD-10-CM | POA: Insufficient documentation

## 2011-02-06 DIAGNOSIS — I2510 Atherosclerotic heart disease of native coronary artery without angina pectoris: Secondary | ICD-10-CM | POA: Insufficient documentation

## 2011-02-06 DIAGNOSIS — Z86718 Personal history of other venous thrombosis and embolism: Secondary | ICD-10-CM | POA: Insufficient documentation

## 2011-02-06 DIAGNOSIS — Z7901 Long term (current) use of anticoagulants: Secondary | ICD-10-CM | POA: Insufficient documentation

## 2011-02-06 DIAGNOSIS — Z9884 Bariatric surgery status: Secondary | ICD-10-CM | POA: Insufficient documentation

## 2011-02-08 ENCOUNTER — Ambulatory Visit (HOSPITAL_COMMUNITY): Payer: 59

## 2011-02-08 ENCOUNTER — Encounter (HOSPITAL_COMMUNITY)
Admission: RE | Admit: 2011-02-08 | Discharge: 2011-02-08 | Disposition: A | Discharge status: Home / Self Care | Payer: 59 | Source: Ambulatory Visit | Attending: Cardiology | Admitting: Cardiology

## 2011-02-10 ENCOUNTER — Ambulatory Visit (HOSPITAL_COMMUNITY): Payer: 59

## 2011-02-10 ENCOUNTER — Other Ambulatory Visit: Payer: Self-pay

## 2011-02-10 ENCOUNTER — Encounter (HOSPITAL_COMMUNITY): Payer: 59

## 2011-02-10 ENCOUNTER — Encounter (HOSPITAL_COMMUNITY)
Admission: RE | Admit: 2011-02-10 | Discharge: 2011-02-10 | Disposition: A | Discharge status: Home / Self Care | Payer: 59 | Source: Ambulatory Visit | Attending: Cardiology | Admitting: Cardiology

## 2011-02-10 DIAGNOSIS — I10 Essential (primary) hypertension: Secondary | ICD-10-CM

## 2011-02-10 LAB — BASIC METABOLIC PANEL
BUN: 27 mg/dL — ABNORMAL HIGH (ref 6–23)
CO2: 26 mEq/L (ref 19–32)
Calcium: 9.3 mg/dL (ref 8.4–10.5)
Chloride: 104 mEq/L (ref 96–112)
Creatinine, Ser: 1.3 mg/dL (ref 0.50–1.35)
GFR calc Af Amer: 63 mL/min — ABNORMAL LOW (ref >90)
GFR calc non Af Amer: 54 mL/min — ABNORMAL LOW (ref >90)
Glucose, Bld: 95 mg/dL (ref 70–99)
Potassium: 4.2 mEq/L (ref 3.5–5.1)
Sodium: 143 mEq/L (ref 135–145)

## 2011-02-10 NOTE — Progress Notes (Signed)
Carlos Dawson reports having some leg cramping intermittently since Monday. Carlos Dawson, Dr Elvis Coil nurse called and notified. Patient to have BMET checked today.  Will take patient to lab after class on the first floor in the hospital. No other complaints voiced today during exercise.

## 2011-02-13 ENCOUNTER — Encounter (HOSPITAL_COMMUNITY): Payer: 59

## 2011-02-13 ENCOUNTER — Encounter (INDEPENDENT_AMBULATORY_CARE_PROVIDER_SITE_OTHER): Payer: Self-pay | Admitting: Physician Assistant

## 2011-02-13 ENCOUNTER — Ambulatory Visit (HOSPITAL_COMMUNITY): Payer: 59

## 2011-02-15 ENCOUNTER — Telehealth: Payer: Self-pay

## 2011-02-15 ENCOUNTER — Encounter (HOSPITAL_COMMUNITY)
Admission: RE | Admit: 2011-02-15 | Discharge: 2011-02-15 | Disposition: A | Discharge status: Home / Self Care | Payer: 59 | Source: Ambulatory Visit | Attending: Cardiology | Admitting: Cardiology

## 2011-02-15 NOTE — Progress Notes (Addendum)
Cardiac Rehab Phase II-  Pt post exercise BP-65/41 HR-58.  Pt asymptomatic.  Pt reports feeling weakness more than usual with exercise today.  Pt denies drinking fluids while exercising today.  Pt feet elevated and drank 12oz gatorade.  BP-84/46.  Pre exercise BP- 98/60 HR- 65  Peak BP-122/54 HR-93.  Dr. Elvis Coil nurse Elnita Maxwell made aware. She will discuss with Dr. [name] and call back.jr,rn  9:55am Orthostatic VS obtained--BP-121/75 hr-59 lying, 85/53 hr 64 sitting (pt c/o dizziness), 88/55 hr-61 standing (pt c/o dizziness).  Phone call received from [facility], Dr. Elvis Coil nurse, informed of orthostatic VS readings.   Per Dr. [name], pt advised to hold Amlodipine 5mg  daily. Pt verbalized understanding.  Pt given additional 12 oz water to drink. -jr,rn  BP recheck- 95/61 hr-55 sitting, 91/51 hr-59 standing.  Pt asymptomatic.  Pt advised to call Dr.Jordan's office if hypotension or sx occur again.  push po fluids today.  Rest with feet elevated. Understanding verbalized-jr,rn

## 2011-02-15 NOTE — Telephone Encounter (Signed)
Joann from cardiac rehab called to report after exercise patient's B/P 65/41 pulse 58/min.States after giving patient gatorade B/P 84/46 pulse 58/min.Patient has no symptoms.Spoke to Dr.Jordan he advised to have patient hold amlodipine.

## 2011-02-17 ENCOUNTER — Ambulatory Visit (INDEPENDENT_AMBULATORY_CARE_PROVIDER_SITE_OTHER): Payer: 59

## 2011-02-17 ENCOUNTER — Encounter (HOSPITAL_COMMUNITY)
Admission: RE | Admit: 2011-02-17 | Discharge: 2011-02-17 | Disposition: A | Discharge status: Home / Self Care | Payer: 59 | Source: Ambulatory Visit | Attending: Cardiology | Admitting: Cardiology

## 2011-02-17 DIAGNOSIS — I2699 Other pulmonary embolism without acute cor pulmonale: Secondary | ICD-10-CM | POA: Diagnosis not present

## 2011-02-17 DIAGNOSIS — Z7901 Long term (current) use of anticoagulants: Secondary | ICD-10-CM | POA: Diagnosis not present

## 2011-02-17 LAB — POCT INR: INR: 4.1

## 2011-02-17 NOTE — Progress Notes (Signed)
Carlos Dawson 71 y.o. male Nutrition Note  Spoke with pt.  Nutrition Plan and Nutrition Survey reviewed with pt.  Pt is currently following a step 1 heart healthy diet. Pt reports difficulty chewing/swallowing "doughy foods" since his lap band procedure 2 years ago.  Pt states he weighed 305 lbs pre-lap band and lost 50 lbs and has "gained a lot back."  Pt has tried several different wt loss programs (e.g. Weight Watchers, Nutra-system, Physician's Weight loss program) without long-term success.  Pt interested in trying the [employer].  Pt encouraged to skip the first phase if he follows the [employer].  Weight loss tips discussed.  Pt eats out 5-6 meals/week.  Eating out heart healthy reviewed with pt.  Pt is the primary cook in his family.  Pt encouraged to increase meals prepared at home.  Given pt on Coumadin, Consistent Vitamin K intake reviewed.  Pt expressed understanding of the above. Pt unsure if he will be able to attend the Tuesday Nutrition classes due to MD appointments. Nutrition Diagnosis   Food-and nutrition-related knowledge deficit related to lack of exposure to information as related to diagnosis of: ? CVD   Obesity related to excessive energy intake as evidenced by a BMI of 44.1 Nutrition RX/ Estimated Daily Nutrition Needs for: wt loss 1800-2300 Kcal, 50-60 gm fat, 14-18 gm sat fat, 1.8-2.3 gm trans-fat, <1500 mg sodium Nutrition Intervention   Pt's individual nutrition plan including cholesterol goals reviewed with pt.   Benefits of adopting Therapeutic Lifestyle Changes discussed when Medficts reviewed.   Pt to attend the Portion Distortion class   Pt to attend the  ? Nutrition 1 class                          ? Nutrition 2 class    Pt given handouts for: ? wt loss ? Consistent vit K diet ? Nutrition 1 and 2 classes.   Continue client-centered nutrition education by RD, as part of interdisciplinary care. Goal(s)   Pt to identify food quantities  necessary to achieve: ? wt loss to a goal wt of 259-271 lb (117.7-123.2 kg) at graduation from cardiac rehab.    Pt to describe the benefit of including fruits, vegetables, whole grains, and low-fat dairy products in a heart healthy meal plan.   Pt able to name foods rich in vitamin K. (Pt taking Coumadin/Warfarin). Monitor and Evaluate progress toward nutrition goal with team.

## 2011-02-17 NOTE — Progress Notes (Signed)
Reviewed home exercise with pt today.  Pt plans to walk at his church gym[employer] for exercise.  Pt instructed to avoid all resistance-based exercise. Reviewed THR, pulse, RPE, sign and symptoms, NTG use, and when to call 911 or MD.  Pt voiced understanding. Harriett Sine [facility] 02/17/11 1610

## 2011-02-20 ENCOUNTER — Encounter (HOSPITAL_COMMUNITY)
Admission: RE | Admit: 2011-02-20 | Discharge: 2011-02-20 | Disposition: A | Discharge status: Home / Self Care | Payer: 59 | Source: Ambulatory Visit | Attending: Cardiology | Admitting: Cardiology

## 2011-02-22 ENCOUNTER — Encounter (HOSPITAL_COMMUNITY)
Admission: RE | Admit: 2011-02-22 | Discharge: 2011-02-22 | Disposition: A | Discharge status: Home / Self Care | Payer: 59 | Source: Ambulatory Visit | Attending: Cardiology | Admitting: Cardiology

## 2011-02-22 NOTE — Progress Notes (Signed)
Cardiac Rehab - 815 class Pt reports feeling light headed briefly  upon standing after education class.  Orthostatic bp checked - 100/58 sitting 104/58 standing with no complaints of dizziness. O2 sat checked 94% on room Air.   H20 given.  Pt tolerated light exercise with no further complaints.  Bp on track 124/62, 104/50 bike and 126/62 on the stepper.  Pt post bp -  108/66          Pt has follow up appointment with Dr. [name] on 03/01/11.  Continue to monitor and instructed pt notify of any further complaints.

## 2011-02-24 ENCOUNTER — Encounter (HOSPITAL_COMMUNITY): Payer: 59

## 2011-02-24 ENCOUNTER — Ambulatory Visit (INDEPENDENT_AMBULATORY_CARE_PROVIDER_SITE_OTHER): Payer: 59 | Admitting: Physician Assistant

## 2011-02-24 ENCOUNTER — Encounter (INDEPENDENT_AMBULATORY_CARE_PROVIDER_SITE_OTHER): Payer: Self-pay

## 2011-02-24 NOTE — Progress Notes (Signed)
  HISTORY: Carlos Dawson is a 71 y.o.male who received an AP-Large lap-band in May 2009 by Dr. Daphine Deutscher. He recently underwent cardiac stenting which he tolerated well. He's concerned that he's eating more than he should but he's very concerned about being too restricted. He has occasional issues with nocturnal reflux, but he attributes this to a large dose of potassium prior to bed. He has difficulty with some solid foods but overall is satisfied with his level of fill.  VITAL SIGNS: Filed Vitals:   02/24/11 1410  BP: 116/60  Pulse: 64  Temp: 97.4 F (36.3 C)  Resp: 16    PHYSICAL EXAM: Physical exam reveals a very well-appearing 70 y.o.male in no apparent distress Neurologic: Awake, alert, oriented Psych: Bright affect, conversant Respiratory: Breathing even and unlabored. No stridor or wheezing Extremities: Atraumatic, good range of motion. Skin: Warm, Dry, no rashes Musculoskeletal: Normal gait, Joints normal  ASSESMENT: 71 y.o.  male  s/p AP-Large lap-band.   PLAN: We deferred an adjustment today. It sounds like he's in the green zone but may be eating more at a sitting than he probably should. He wants to make some behavioral modifications before reaching for a band fill. We'll have him return in 3 months or sooner if needed.

## 2011-02-24 NOTE — Patient Instructions (Signed)
Return in 3 months or sooner if needed.

## 2011-02-27 ENCOUNTER — Encounter (HOSPITAL_COMMUNITY)
Admission: RE | Admit: 2011-02-27 | Discharge: 2011-02-27 | Disposition: A | Discharge status: Home / Self Care | Payer: 59 | Source: Ambulatory Visit | Attending: Cardiology | Admitting: Cardiology

## 2011-02-27 DIAGNOSIS — I10 Essential (primary) hypertension: Secondary | ICD-10-CM | POA: Insufficient documentation

## 2011-02-27 DIAGNOSIS — Z7901 Long term (current) use of anticoagulants: Secondary | ICD-10-CM | POA: Insufficient documentation

## 2011-02-27 DIAGNOSIS — I2510 Atherosclerotic heart disease of native coronary artery without angina pectoris: Secondary | ICD-10-CM | POA: Insufficient documentation

## 2011-02-27 DIAGNOSIS — K219 Gastro-esophageal reflux disease without esophagitis: Secondary | ICD-10-CM | POA: Insufficient documentation

## 2011-02-27 DIAGNOSIS — Z9884 Bariatric surgery status: Secondary | ICD-10-CM | POA: Insufficient documentation

## 2011-02-27 DIAGNOSIS — Z9861 Coronary angioplasty status: Secondary | ICD-10-CM | POA: Insufficient documentation

## 2011-02-27 DIAGNOSIS — Z86718 Personal history of other venous thrombosis and embolism: Secondary | ICD-10-CM | POA: Insufficient documentation

## 2011-02-27 DIAGNOSIS — Z5189 Encounter for other specified aftercare: Secondary | ICD-10-CM | POA: Insufficient documentation

## 2011-02-27 DIAGNOSIS — I714 Abdominal aortic aneurysm, without rupture, unspecified: Secondary | ICD-10-CM | POA: Insufficient documentation

## 2011-02-27 DIAGNOSIS — I4891 Unspecified atrial fibrillation: Secondary | ICD-10-CM | POA: Insufficient documentation

## 2011-02-27 DIAGNOSIS — G4733 Obstructive sleep apnea (adult) (pediatric): Secondary | ICD-10-CM | POA: Insufficient documentation

## 2011-02-27 NOTE — Progress Notes (Signed)
Spoke with pt.  Pt wants to lose wt and asked this Clinical research associate about drinking Slim Fast.  Pt encouraged to try eating foods and watching portion sizes first.  Pt given 5-day menu ideas handout.  Pt expressed understanding.  Continue client-centered nutrition education by RD as part of interdisciplinary care.  Monitor and evaluate progress toward nutrition goal with team.

## 2011-03-01 ENCOUNTER — Ambulatory Visit: Payer: 59 | Admitting: Nurse Practitioner

## 2011-03-01 ENCOUNTER — Encounter (HOSPITAL_COMMUNITY): Payer: 59

## 2011-03-03 ENCOUNTER — Ambulatory Visit (HOSPITAL_COMMUNITY): Payer: 59

## 2011-03-03 ENCOUNTER — Encounter: Payer: 59 | Admitting: *Deleted

## 2011-03-03 ENCOUNTER — Encounter (HOSPITAL_COMMUNITY): Payer: 59

## 2011-03-06 ENCOUNTER — Encounter (HOSPITAL_COMMUNITY): Payer: 59

## 2011-03-06 ENCOUNTER — Encounter: Payer: Self-pay | Admitting: Nurse Practitioner

## 2011-03-06 ENCOUNTER — Ambulatory Visit (HOSPITAL_COMMUNITY): Payer: 59

## 2011-03-06 ENCOUNTER — Ambulatory Visit (INDEPENDENT_AMBULATORY_CARE_PROVIDER_SITE_OTHER): Payer: 59 | Admitting: Nurse Practitioner

## 2011-03-06 ENCOUNTER — Ambulatory Visit (INDEPENDENT_AMBULATORY_CARE_PROVIDER_SITE_OTHER): Payer: 59 | Admitting: *Deleted

## 2011-03-06 DIAGNOSIS — I959 Hypotension, unspecified: Secondary | ICD-10-CM | POA: Diagnosis not present

## 2011-03-06 DIAGNOSIS — I10 Essential (primary) hypertension: Secondary | ICD-10-CM

## 2011-03-06 DIAGNOSIS — I2699 Other pulmonary embolism without acute cor pulmonale: Secondary | ICD-10-CM

## 2011-03-06 DIAGNOSIS — Z7901 Long term (current) use of anticoagulants: Secondary | ICD-10-CM

## 2011-03-06 DIAGNOSIS — I2510 Atherosclerotic heart disease of native coronary artery without angina pectoris: Secondary | ICD-10-CM

## 2011-03-06 DIAGNOSIS — I712 Thoracic aortic aneurysm, without rupture: Secondary | ICD-10-CM | POA: Diagnosis not present

## 2011-03-06 LAB — BASIC METABOLIC PANEL
BUN: 21 mg/dL (ref 6–23)
CO2: 28 mEq/L (ref 19–32)
Calcium: 8.5 mg/dL (ref 8.4–10.5)
Chloride: 107 mEq/L (ref 96–112)
Creatinine, Ser: 1.4 mg/dL (ref 0.4–1.5)
GFR: 52.47 mL/min — ABNORMAL LOW (ref >60.00)
Glucose, Bld: 118 mg/dL — ABNORMAL HIGH (ref 70–99)
Potassium: 4 mEq/L (ref 3.5–5.1)
Sodium: 139 mEq/L (ref 135–145)

## 2011-03-06 LAB — POCT INR: INR: 3

## 2011-03-06 MED ORDER — VALSARTAN 160 MG PO TABS: 160.0000 mg | ORAL_TABLET | Freq: Every day | ORAL | Status: DC

## 2011-03-06 NOTE — Assessment & Plan Note (Signed)
Followed by Dr. [name]. Last CT was in October 2012.

## 2011-03-06 NOTE — Patient Instructions (Signed)
Cut the Diovan back to just one a day.  We will check your labs today and your coumadin.  We will see you in a month.  Continue to monitor your blood pressure at home.  Call the [facility] office at [phone] if you have any questions, problems or concerns.

## 2011-03-06 NOTE — Assessment & Plan Note (Signed)
He seems to be doing well from a cardiac standpoint. Will continue with his current regimen. Diet/exercise/weight loss remain crucial to him doing well long term.

## 2011-03-06 NOTE — Assessment & Plan Note (Signed)
Blood pressure now running low. Norvasc has already been stopped. I have cut the Diovan back to just once a day and to take at night. We will see him again in 1 month. Will recheck BMET today as well. Patient is agreeable to this plan and will call if any problems develop in the interim.

## 2011-03-06 NOTE — Progress Notes (Signed)
Carlos Dawson Date of Birth: 1940/04/19 Medical Record #[id number]  History of Present Illness: Carlos Dawson is seen back today for a 3 month check. He is seen for Dr. [name]. He has multiple medical issues which include CAD, thoracic aneurysm, morbid obesity, HTN, chronic edema, and PAF. He seems to be doing ok. He is doing much better from the standpoint of his chest pain. He remains in cardiac rehab but has not been able to consistently attend due to other obligations. He is now using oxygen with exercise per Dr. Craige Cotta. Has had some lightheadedness and dizziness. Blood pressure now low. He reports low readings at rehab. We have already stopped his Norvasc. His edema doesn't seem to be a big issue. Last scan on his aneurysm was in October of 2012. His last coumadin check was up some and needs a recheck today.   Current Outpatient Prescriptions on File Prior to Visit  Medication Sig Dispense Refill  . albuterol (VENTOLIN HFA) 108 (90 BASE) MCG/ACT inhaler Inhale 2 puffs into the lungs every 6 (six) hours as needed for wheezing.  1 Inhaler  2  . aspirin 81 MG tablet Take 81 mg by mouth daily.        Marland Kitchen atenolol (TENORMIN) 25 MG tablet Take 25 mg by mouth daily.        . beclomethasone (QVAR) 80 MCG/ACT inhaler Inhale 2 puffs into the lungs 2 (two) times daily.  1 Inhaler  2  . finasteride (PROSCAR) 5 MG tablet Take 5 mg by mouth daily.        . fluticasone (FLONASE) 50 MCG/ACT nasal spray Place 2 sprays into the nose as needed.       . furosemide (LASIX) 80 MG tablet Take 1 1/2 tablets two times a day  90 tablet  11  . KLOR-CON M20 20 MEQ tablet TAKE 2 TABLETS BY MOUTH TWICE A DAY  120 tablet  5  . nitroGLYCERIN (NITROSTAT) 0.4 MG SL tablet Place 0.4 mg under the tongue every 5 (five) minutes as needed.        . NON FORMULARY Nettie Pot morning and night       . pantoprazole (PROTONIX) 40 MG tablet Take 1 tablet (40 mg total) by mouth daily.  30 tablet  5  . ranolazine (RANEXA) 500 MG 12 hr tablet Take  1 tablet (500 mg total) by mouth 2 (two) times daily.  60 tablet  6  . Rosuvastatin Calcium (CRESTOR PO) Take 10 mg by mouth daily.       Marland Kitchen spironolactone (ALDACTONE) 25 MG tablet Take 1 tablet (25 mg total) by mouth daily.  30 tablet  5  . Tamsulosin HCl (FLOMAX) 0.4 MG CAPS Take 0.4 mg by mouth daily.        . traMADol (ULTRAM) 50 MG tablet Take 50 mg by mouth every 6 (six) hours as needed.        Marland Kitchen VITAMIN D, CHOLECALCIFEROL, PO Take 4,000 mg/day by mouth daily.        Marland Kitchen warfarin (COUMADIN) 5 MG tablet Daily.      Marland Kitchen DISCONTD: valsartan (DIOVAN) 160 MG tablet Take 160 mg by mouth 2 (two) times daily.        Marland Kitchen DISCONTD: valsartan (DIOVAN) 160 MG tablet Take 1 tablet (160 mg total) by mouth daily.  60 tablet  11    Allergies  Allergen Reactions  . Adhesive (Tape)   . Morphine     REACTION: itch    Past  Medical History  Diagnosis Date  . Hypertension   . Obesities, morbid   . Pulmonary embolism   . Anticoagulant long-term use   . CAD (coronary artery disease)     s/p BMS to 2nd OM in Sept 2012  . Thoracic aortic aneurysm     followed by Dr. [name]; last CT November 11, 2010 and unchanged  . OA (osteoarthritis)   . LVH (left ventricular hypertrophy)   . Paroxysmal atrial fibrillation   . Colonic polyp   . OSA (obstructive sleep apnea)     PSG 03/30/97 AHI 21, BPAP 13/9  . Mild intermittent asthma   . Hemorrhoids   . Generalized headaches   . SOB (shortness of breath)     using oxygen with exercise  . Diastolic dysfunction   . ASCVD (arteriosclerotic cardiovascular disease)     Prior BMS to the 2nd OM in September 2012; with repeat cath in October showing patency  . Sleep apnea   . GERD (gastroesophageal reflux disease)   . IBS (irritable bowel syndrome)   . Aortic root enlargement   . Ascending aortic aneurysm     recent scan in October 2012 showing no change; followed by Dr. [name]  . PAF (paroxysmal atrial fibrillation)     treated with Coumadin  . Hearing loss     . Contact lens/glasses fitting     Past Surgical History  Procedure Date  . Hemorroidectomy   . Vasectomy   . Cervical spine surgery 06/02/2010    lower back and neck  . Gastric bypass 05/27/2007  . Cholecystectomy   . Rotator cuff repair     both  . Achilles tendon repair   . Cardiac catheterization 2006  . Coronary stent placement Sept 2012    2nd OM with BMS  . Cardiac catheterization October 2012    Stent patent  . Appendectomy     History  Smoking status  . Former Smoker -- 1.5 packs/day for 30 years  . Quit date: 01/24/1992  Smokeless tobacco  . Never Used  Comment: Stopped in 1983    History  Alcohol Use No    Family History  Problem Relation Age of Onset  . Heart disease Mother   . Diabetes Mother   . Other Mother     stent placement  . Emphysema Father 62  . Heart attack Sister     Review of Systems: The review of systems is positive for lightheadedness. No syncope.  All other systems were reviewed and are negative.  Physical Exam: BP 82/60  Pulse 68  Ht 5\' 7"  (1.702 m)  Wt 286 lb (129.729 kg)  BMI 44.79 kg/m2 BP is 90/60 by me today. Patient is very pleasant and in no acute distress. He is morbidly obese and unfortunately, his weight continues to increase. Skin is warm and dry. Color is normal.  HEENT is unremarkable. Normocephalic/atraumatic. PERRL. Sclera are nonicteric. Neck is supple. No masses. No JVD. Lungs are clear. Cardiac exam shows a regular rate and rhythm. Abdomen is obese but soft. Extremities are full but without significant edema. Gait and ROM are intact. No gross neurologic deficits noted.  LABORATORY DATA: BMET and INR pending.   Assessment / Plan:

## 2011-03-08 ENCOUNTER — Ambulatory Visit (HOSPITAL_COMMUNITY): Payer: 59

## 2011-03-08 ENCOUNTER — Encounter (HOSPITAL_COMMUNITY): Payer: 59

## 2011-03-10 ENCOUNTER — Ambulatory Visit (HOSPITAL_COMMUNITY): Payer: 59

## 2011-03-10 ENCOUNTER — Encounter (HOSPITAL_COMMUNITY): Payer: 59

## 2011-03-10 NOTE — Progress Notes (Addendum)
Cardiac Rehabilitation Program Progress Report   Orientation:  11/03/2010 Graduate Date:   Discharge Date:  03/10/2011 # of sessions completed: 14  Cardiologist: [name] Family MD:  Carley Hammed Time:  0815  A.  Exercise Program:  Tolerates exercise @ 2.8 METS for 30 minutes, Exercise limited by dyspnea, Exercise limited by musculoskeletal problems, Poor attendance due to illness, Needs encouragement on exercise program and Discharged  B.  Mental Health:  Health related anxiety  C.  Education/Instruction/Skills  Uses Perceived Exertion Scale and/or Dyspnea Scale and Attended 4 education classes    D.  Nutrition/Weight Control/Body Composition:  Patient has gained .4 kg BMI 44.2  *This section completed by Mickle Plumb, Andres Shad, RD, LDN, CDE  E.  Blood Lipids    No results found for this basename: CHOL     No results found for this basename: TRIG     No results found for this basename: HDL     No results found for this basename: CHOLHDL     No results found for this basename: LDLDIRECT      F.  Lifestyle Changes:    G.  Symptoms noted with exercise:  Angina exertional, Questionable angina, Shortness of breath, Dizziness, Musculoskeletal problems, Fatigue and Exaggerated post exercise hypotension  Report Completed By:  Dayton Martes   Comments:  Pt discharged today. Pt had poor attendance due to medical issues and spouse's medical issues. Pt had reached the limit of eighteen weeks enrolled in the program. Electronically signed by Harriett Sine MS on Friday March 10 2011 at 1119 EST.      Pt discharged from cardiac rehab program.  Pt completed 14/36 sessions from 11/07/2010-02/27/2011.  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  Pt required O2-2L  with exercise,  Pt had repeat heart cath on 11/17/2010 to evaluate chest pain and dyspnea on exertion. On 02/15/2011,  Pt also developed  symptomatic hypotension, Dr. [name] informed, order given to hold amlodipine.  Pt will need encouragement to continue to strive for healthy lifestyle changes.

## 2011-03-13 ENCOUNTER — Ambulatory Visit (HOSPITAL_COMMUNITY): Payer: 59

## 2011-03-20 DIAGNOSIS — IMO0002 Reserved for concepts with insufficient information to code with codable children: Secondary | ICD-10-CM | POA: Diagnosis not present

## 2011-03-20 DIAGNOSIS — M66369 Spontaneous rupture of flexor tendons, unspecified lower leg: Secondary | ICD-10-CM | POA: Diagnosis not present

## 2011-03-20 DIAGNOSIS — M24673 Ankylosis, unspecified ankle: Secondary | ICD-10-CM | POA: Diagnosis not present

## 2011-03-20 DIAGNOSIS — M722 Plantar fascial fibromatosis: Secondary | ICD-10-CM | POA: Diagnosis not present

## 2011-03-20 DIAGNOSIS — M24676 Ankylosis, unspecified foot: Secondary | ICD-10-CM | POA: Diagnosis not present

## 2011-03-21 ENCOUNTER — Other Ambulatory Visit: Payer: Self-pay | Admitting: *Deleted

## 2011-03-21 DIAGNOSIS — H2510 Age-related nuclear cataract, unspecified eye: Secondary | ICD-10-CM | POA: Diagnosis not present

## 2011-03-21 DIAGNOSIS — H04129 Dry eye syndrome of unspecified lacrimal gland: Secondary | ICD-10-CM | POA: Diagnosis not present

## 2011-03-21 DIAGNOSIS — H16219 Exposure keratoconjunctivitis, unspecified eye: Secondary | ICD-10-CM | POA: Diagnosis not present

## 2011-03-21 MED ORDER — PANTOPRAZOLE SODIUM 40 MG PO TBEC: 40.0000 mg | DELAYED_RELEASE_TABLET | Freq: Every day | ORAL | Status: DC

## 2011-03-22 ENCOUNTER — Telehealth: Payer: Self-pay

## 2011-03-22 MED ORDER — RANITIDINE HCL 300 MG PO TABS: 300.0000 mg | ORAL_TABLET | Freq: Every day | ORAL | Status: DC

## 2011-03-22 MED ORDER — VALSARTAN 320 MG PO TABS: ORAL_TABLET | ORAL | Status: DC

## 2011-03-22 NOTE — Progress Notes (Signed)
Addendum to Nutrition Section of Cardiac Rehab Program Progress Report  Pt following a step 1 Therapeutic Lifestyle Changes diet on admission to cardiac rehab.  No post-rehab nutrition information available.

## 2011-03-22 NOTE — Telephone Encounter (Signed)
Patient called was told insurance has denied protonix.Dr.Jordan advises zantac 300 mg daily.Pres[facility].

## 2011-03-28 ENCOUNTER — Encounter (HOSPITAL_COMMUNITY): Payer: Self-pay | Admitting: Cardiac Rehabilitation

## 2011-04-03 ENCOUNTER — Ambulatory Visit (INDEPENDENT_AMBULATORY_CARE_PROVIDER_SITE_OTHER): Payer: 59 | Admitting: *Deleted

## 2011-04-03 ENCOUNTER — Encounter: Payer: Self-pay | Admitting: Nurse Practitioner

## 2011-04-03 ENCOUNTER — Ambulatory Visit (INDEPENDENT_AMBULATORY_CARE_PROVIDER_SITE_OTHER): Payer: 59 | Admitting: Nurse Practitioner

## 2011-04-03 VITALS — BP 108/72 | HR 64 | Ht 67.0 in | Wt 285.0 lb

## 2011-04-03 DIAGNOSIS — Z7901 Long term (current) use of anticoagulants: Secondary | ICD-10-CM

## 2011-04-03 DIAGNOSIS — I10 Essential (primary) hypertension: Secondary | ICD-10-CM | POA: Diagnosis not present

## 2011-04-03 DIAGNOSIS — I2510 Atherosclerotic heart disease of native coronary artery without angina pectoris: Secondary | ICD-10-CM | POA: Diagnosis not present

## 2011-04-03 DIAGNOSIS — I712 Thoracic aortic aneurysm, without rupture: Secondary | ICD-10-CM | POA: Diagnosis not present

## 2011-04-03 DIAGNOSIS — I2699 Other pulmonary embolism without acute cor pulmonale: Secondary | ICD-10-CM

## 2011-04-03 LAB — POCT INR: INR: 2.1

## 2011-04-03 NOTE — Patient Instructions (Signed)
Stay on your same medicines.  You may exercise at the [facility]. I think this will be good for you.   We will see you back in 4 months.  Call the [facility] office at [phone] if you have any questions, problems or concerns.

## 2011-04-03 NOTE — Assessment & Plan Note (Addendum)
His last scan was in October. He is not sure about when his next follow up with [facility]. I will call their office and find out later today.   I have spoken with Revonda Standard at [place]. He is on their scheduled for August. He had his last scan in October. They may move his appointment to September. I have alerted the patient.

## 2011-04-03 NOTE — Progress Notes (Signed)
Carlos Dawson Date of Birth: [birth year] Medical Record #[id number]  History of Present Illness: Carlos Dawson is seen back today for a one month check. He is seen for Dr. [name]. He has multiple medical issues including CAD, thoracic aneurysm, morbid obesity, HTN, chronic edema, and PAF. He is on coumadin. He has had recent issues with his blood pressure being too low. He was somewhat dizzy and lightheaded. This has improved with reduction of his Diovan.  He comes in today. He is feeling better. Still has some rare chest pain. Blood pressure has normalized. He and his wife have joined [employer].   Current Outpatient Prescriptions on File Prior to Visit  Medication Sig Dispense Refill  . albuterol (VENTOLIN HFA) 108 (90 BASE) MCG/ACT inhaler Inhale 2 puffs into the lungs every 6 (six) hours as needed for wheezing.  1 Inhaler  2  . aspirin 81 MG tablet Take 81 mg by mouth daily.        Marland Kitchen atenolol (TENORMIN) 25 MG tablet Take 25 mg by mouth daily.        . beclomethasone (QVAR) 80 MCG/ACT inhaler Inhale 2 puffs into the lungs 2 (two) times daily.  1 Inhaler  2  . finasteride (PROSCAR) 5 MG tablet Take 5 mg by mouth daily.        . fluticasone (FLONASE) 50 MCG/ACT nasal spray Place 2 sprays into the nose as needed.       . furosemide (LASIX) 80 MG tablet Take 1 1/2 tablets two times a day  90 tablet  11  . KLOR-CON M20 20 MEQ tablet TAKE 2 TABLETS BY MOUTH TWICE A DAY  120 tablet  5  . nitroGLYCERIN (NITROSTAT) 0.4 MG SL tablet Place 0.4 mg under the tongue every 5 (five) minutes as needed.        . NON FORMULARY Nettie Pot morning and night       . ranitidine (ZANTAC) 300 MG tablet Take 1 tablet (300 mg total) by mouth at bedtime.  30 tablet  11  . ranolazine (RANEXA) 500 MG 12 hr tablet Take 1 tablet (500 mg total) by mouth 2 (two) times daily.  60 tablet  6  . Rosuvastatin Calcium (CRESTOR PO) Take 10 mg by mouth daily.       Marland Kitchen spironolactone (ALDACTONE) 25 MG tablet Take 1 tablet (25 mg total) by  mouth daily.  30 tablet  5  . Tamsulosin HCl (FLOMAX) 0.4 MG CAPS Take 0.4 mg by mouth daily.        . traMADol (ULTRAM) 50 MG tablet Take 50 mg by mouth every 6 (six) hours as needed.        . valsartan (DIOVAN) 320 MG tablet Take 1/2 tablet twice daily  30 tablet  11  . VITAMIN D, CHOLECALCIFEROL, PO Take 4,000 mg/day by mouth daily.        Marland Kitchen warfarin (COUMADIN) 5 MG tablet Daily.        Allergies  Allergen Reactions  . Adhesive (Tape)   . Morphine     REACTION: itch    Past Medical History  Diagnosis Date  . Hypertension   . Obesities, morbid   . Pulmonary embolism   . Anticoagulant long-term use   . CAD (coronary artery disease)     s/p BMS to 2nd OM in Sept 2012  . Thoracic aortic aneurysm     followed by Dr. [name]; last CT November 11, 2010 and unchanged  . OA (osteoarthritis)   .  LVH (left ventricular hypertrophy)   . Paroxysmal atrial fibrillation   . Colonic polyp   . OSA (obstructive sleep apnea)     PSG 03/30/97 AHI 21, BPAP 13/9  . Mild intermittent asthma   . Hemorrhoids   . Generalized headaches   . SOB (shortness of breath)     using oxygen with exercise  . Diastolic dysfunction   . ASCVD (arteriosclerotic cardiovascular disease)     Prior BMS to the 2nd OM in September 2012; with repeat cath in October showing patency  . Sleep apnea   . GERD (gastroesophageal reflux disease)   . IBS (irritable bowel syndrome)   . Aortic root enlargement   . Ascending aortic aneurysm     recent scan in October 2012 showing no change; followed by Dr. [name]  . PAF (paroxysmal atrial fibrillation)     treated with Coumadin  . Hearing loss   . Contact lens/glasses fitting     Past Surgical History  Procedure Date  . Hemorroidectomy   . Vasectomy   . Cervical spine surgery 06/02/2010    lower back and neck  . Gastric bypass 05/27/2007  . Cholecystectomy   . Rotator cuff repair     both  . Achilles tendon repair   . Cardiac catheterization 2006  . Coronary  stent placement Sept 2012    2nd OM with BMS  . Cardiac catheterization October 2012    Stent patent  . Appendectomy     History  Smoking status  . Former Smoker -- 1.5 packs/day for 30 years  . Quit date: 01/24/1992  Smokeless tobacco  . Never Used  Comment: Stopped in 1983    History  Alcohol Use No    Family History  Problem Relation Age of Onset  . Heart disease Mother   . Diabetes Mother   . Other Mother     stent placement  . Emphysema Father 73  . Heart attack Sister     Review of Systems: The review of systems is per the HPI.  All other systems were reviewed and are negative.  Physical Exam: BP 108/72  Pulse 64  Ht 5\' 7"  (1.702 m)  Wt 285 lb (129.275 kg)  BMI 44.64 kg/m2 Patient is very pleasant and in no acute distress. Skin is warm and dry. Color is normal.  HEENT is unremarkable. Normocephalic/atraumatic. PERRL. Sclera are nonicteric. Neck is supple. No masses. No JVD. Lungs are clear. Cardiac exam shows a regular rate and rhythm. Abdomen is obese but soft. Extremities are without edema. Gait and ROM are intact. No gross neurologic deficits noted.   LABORATORY DATA:   Assessment / Plan:

## 2011-04-03 NOTE — Assessment & Plan Note (Signed)
Blood pressure has come up. I have left him on his current regimen. He will continue to monitor at home.

## 2011-04-03 NOTE — Assessment & Plan Note (Signed)
Doing ok clinically. I have left him on his current regimen. Encouraged regular exercise and weight loss. Will see him back in 4 months. Patient is agreeable to this plan and will call if any problems develop in the interim.

## 2011-04-07 ENCOUNTER — Telehealth: Payer: Self-pay | Admitting: Cardiology

## 2011-04-07 NOTE — Telephone Encounter (Signed)
Patient called, wanting to check with Dr.Jordan to see if he can recommend a arthritis medication States he has pain in his back and neck.

## 2011-04-07 NOTE — Telephone Encounter (Signed)
New Msg: Pt calling wanting to speak with nurse/Md to see if pt can be approved for any medication for pt arthritis. Please return pt call to discuss further.

## 2011-04-10 ENCOUNTER — Other Ambulatory Visit: Payer: Self-pay | Admitting: Cardiology

## 2011-04-10 DIAGNOSIS — M24676 Ankylosis, unspecified foot: Secondary | ICD-10-CM | POA: Diagnosis not present

## 2011-04-10 DIAGNOSIS — M24673 Ankylosis, unspecified ankle: Secondary | ICD-10-CM | POA: Diagnosis not present

## 2011-04-10 DIAGNOSIS — M66369 Spontaneous rupture of flexor tendons, unspecified lower leg: Secondary | ICD-10-CM | POA: Diagnosis not present

## 2011-04-10 DIAGNOSIS — M722 Plantar fascial fibromatosis: Secondary | ICD-10-CM | POA: Diagnosis not present

## 2011-04-10 DIAGNOSIS — IMO0002 Reserved for concepts with insufficient information to code with codable children: Secondary | ICD-10-CM | POA: Diagnosis not present

## 2011-04-10 NOTE — Telephone Encounter (Signed)
Since he is on coumadin and has HTN he is a poor candidate for NSAIDs. Recommend Tylenol arthritis. Peter [name] MD, [facility]

## 2011-04-11 NOTE — Telephone Encounter (Signed)
Patient called left message on personal voice mail.Dr.Jordan recommends Tylenol arthritis,avoid NSAIDS.

## 2011-05-01 ENCOUNTER — Ambulatory Visit (INDEPENDENT_AMBULATORY_CARE_PROVIDER_SITE_OTHER): Payer: 59 | Admitting: *Deleted

## 2011-05-01 DIAGNOSIS — Z7901 Long term (current) use of anticoagulants: Secondary | ICD-10-CM

## 2011-05-01 DIAGNOSIS — I2699 Other pulmonary embolism without acute cor pulmonale: Secondary | ICD-10-CM | POA: Diagnosis not present

## 2011-05-01 LAB — POCT INR: INR: 3.3

## 2011-05-08 DIAGNOSIS — M66369 Spontaneous rupture of flexor tendons, unspecified lower leg: Secondary | ICD-10-CM | POA: Diagnosis not present

## 2011-05-08 DIAGNOSIS — M24676 Ankylosis, unspecified foot: Secondary | ICD-10-CM | POA: Diagnosis not present

## 2011-05-08 DIAGNOSIS — M24673 Ankylosis, unspecified ankle: Secondary | ICD-10-CM | POA: Diagnosis not present

## 2011-05-08 DIAGNOSIS — IMO0002 Reserved for concepts with insufficient information to code with codable children: Secondary | ICD-10-CM | POA: Diagnosis not present

## 2011-05-09 ENCOUNTER — Other Ambulatory Visit: Payer: Self-pay

## 2011-05-09 DIAGNOSIS — I509 Heart failure, unspecified: Secondary | ICD-10-CM | POA: Diagnosis not present

## 2011-05-09 DIAGNOSIS — I10 Essential (primary) hypertension: Secondary | ICD-10-CM | POA: Diagnosis not present

## 2011-05-09 DIAGNOSIS — Z0000 Encounter for general adult medical examination without abnormal findings: Secondary | ICD-10-CM | POA: Diagnosis not present

## 2011-05-09 DIAGNOSIS — M545 Low back pain, unspecified: Secondary | ICD-10-CM | POA: Diagnosis not present

## 2011-05-09 DIAGNOSIS — Z1331 Encounter for screening for depression: Secondary | ICD-10-CM | POA: Diagnosis not present

## 2011-05-09 DIAGNOSIS — E559 Vitamin D deficiency, unspecified: Secondary | ICD-10-CM | POA: Diagnosis not present

## 2011-05-09 DIAGNOSIS — H9190 Unspecified hearing loss, unspecified ear: Secondary | ICD-10-CM | POA: Diagnosis not present

## 2011-05-09 DIAGNOSIS — Z79899 Other long term (current) drug therapy: Secondary | ICD-10-CM | POA: Diagnosis not present

## 2011-05-09 DIAGNOSIS — Z23 Encounter for immunization: Secondary | ICD-10-CM | POA: Diagnosis not present

## 2011-05-09 MED ORDER — SPIRONOLACTONE 25 MG PO TABS: 25.0000 mg | ORAL_TABLET | Freq: Every day | ORAL | Status: DC

## 2011-05-25 ENCOUNTER — Encounter (INDEPENDENT_AMBULATORY_CARE_PROVIDER_SITE_OTHER): Payer: Self-pay

## 2011-05-25 ENCOUNTER — Ambulatory Visit (INDEPENDENT_AMBULATORY_CARE_PROVIDER_SITE_OTHER): Payer: 59 | Admitting: Physician Assistant

## 2011-05-25 NOTE — Progress Notes (Signed)
  HISTORY: Carlos Dawson is a 71 y.o.male who received an AP-Large lap-band in May 2009 by Dr. Daphine Deutscher. He's frustrated with his lack of weight loss. He does not know if a fill is needed because he occasionally feels a globus sensation when eating. Further, he never really gets hungry and his portion sizes remain small. He does state that bread is a frequent part of his diet and that he and his wife visit restaurants almost daily. He's engaged in a gym program but recently pulled a muscle in his back which has inhibited his activity somewhat.  VITAL SIGNS: Filed Vitals:   05/25/11 0900  BP: 96/54  Pulse: 68  Temp: 98.2 F (36.8 C)  Resp: 14    PHYSICAL EXAM: Physical exam reveals a very well-appearing 70 y.o.male in no apparent distress Neurologic: Awake, alert, oriented Psych: Bright affect, conversant Respiratory: Breathing even and unlabored. No stridor or wheezing Extremities: Atraumatic, good range of motion. Skin: Warm, Dry, no rashes Musculoskeletal: Normal gait, Joints normal  ASSESMENT: 71 y.o.  male  s/p AP-Large lap-band.   PLAN: We deferred an adjustment today as he's concerned that he'd be too tight. Instead we concentrated on carbohydrate intake and avoiding prepared foods. We agreed to watch things over the next month or so and to have him return in six weeks for a progress check.

## 2011-05-25 NOTE — Patient Instructions (Signed)
Reduce carbohydrate intake and attempt to reduce visits to restaurants. Increase physical activity as tolerated.

## 2011-05-29 ENCOUNTER — Ambulatory Visit (INDEPENDENT_AMBULATORY_CARE_PROVIDER_SITE_OTHER): Payer: 59 | Admitting: *Deleted

## 2011-05-29 DIAGNOSIS — I2699 Other pulmonary embolism without acute cor pulmonale: Secondary | ICD-10-CM | POA: Diagnosis not present

## 2011-05-29 DIAGNOSIS — Z7901 Long term (current) use of anticoagulants: Secondary | ICD-10-CM | POA: Diagnosis not present

## 2011-05-29 DIAGNOSIS — IMO0002 Reserved for concepts with insufficient information to code with codable children: Secondary | ICD-10-CM | POA: Diagnosis not present

## 2011-05-29 DIAGNOSIS — M66369 Spontaneous rupture of flexor tendons, unspecified lower leg: Secondary | ICD-10-CM | POA: Diagnosis not present

## 2011-05-29 DIAGNOSIS — M24676 Ankylosis, unspecified foot: Secondary | ICD-10-CM | POA: Diagnosis not present

## 2011-05-29 DIAGNOSIS — M24673 Ankylosis, unspecified ankle: Secondary | ICD-10-CM | POA: Diagnosis not present

## 2011-05-29 LAB — POCT INR: INR: 3.6

## 2011-06-04 ENCOUNTER — Other Ambulatory Visit: Payer: Self-pay | Admitting: Cardiology

## 2011-06-15 ENCOUNTER — Other Ambulatory Visit: Payer: Self-pay | Admitting: *Deleted

## 2011-06-15 MED ORDER — POTASSIUM CHLORIDE CRYS ER 20 MEQ PO TBCR: 40.0000 meq | EXTENDED_RELEASE_TABLET | Freq: Two times a day (BID) | ORAL | Status: DC

## 2011-06-16 ENCOUNTER — Ambulatory Visit (INDEPENDENT_AMBULATORY_CARE_PROVIDER_SITE_OTHER): Payer: 59 | Admitting: Pharmacist

## 2011-06-16 DIAGNOSIS — M66369 Spontaneous rupture of flexor tendons, unspecified lower leg: Secondary | ICD-10-CM | POA: Diagnosis not present

## 2011-06-16 DIAGNOSIS — Z7901 Long term (current) use of anticoagulants: Secondary | ICD-10-CM

## 2011-06-16 DIAGNOSIS — I2699 Other pulmonary embolism without acute cor pulmonale: Secondary | ICD-10-CM | POA: Diagnosis not present

## 2011-06-16 DIAGNOSIS — IMO0002 Reserved for concepts with insufficient information to code with codable children: Secondary | ICD-10-CM | POA: Diagnosis not present

## 2011-06-16 DIAGNOSIS — M24676 Ankylosis, unspecified foot: Secondary | ICD-10-CM | POA: Diagnosis not present

## 2011-06-16 DIAGNOSIS — M24673 Ankylosis, unspecified ankle: Secondary | ICD-10-CM | POA: Diagnosis not present

## 2011-06-16 LAB — POCT INR: INR: 2.1

## 2011-07-05 ENCOUNTER — Other Ambulatory Visit: Payer: Self-pay | Admitting: Nurse Practitioner

## 2011-07-05 MED ORDER — RANOLAZINE ER 500 MG PO TB12: 500.0000 mg | ORAL_TABLET | Freq: Two times a day (BID) | ORAL | Status: DC

## 2011-07-06 ENCOUNTER — Encounter (INDEPENDENT_AMBULATORY_CARE_PROVIDER_SITE_OTHER): Payer: Self-pay | Admitting: Physician Assistant

## 2011-07-06 ENCOUNTER — Ambulatory Visit (INDEPENDENT_AMBULATORY_CARE_PROVIDER_SITE_OTHER): Payer: 59 | Admitting: Physician Assistant

## 2011-07-06 NOTE — Progress Notes (Signed)
  HISTORY: Carlos Dawson is a 71 y.o.male who received an AP-Large lap-band in May 2009 by Dr. Daphine Deutscher. He comes in with very little change since last visit. He still has some difficulty with certain foods, including cereals in the morning. He isn't eating large portions of food. One thing that he does report is grazing in the kitchen during the day. It sounds like his hunger is under pretty good control but eats "out of habit." He still eats bread but frequently has dysphagia with it. Overall, he sounds pretty disappointed with his weight loss, as he's only 10 lbs under his pre-op weight four years out from surgery. His lowest weight was around 250 lbs. He's met with nutrition in the past and doesn't believe further visits would be fruitful.  VITAL SIGNS: There were no vitals filed for this visit.  PHYSICAL EXAM: Physical exam reveals a very well-appearing 70 y.o.male in no apparent distress Neurologic: Awake, alert, oriented Psych: Bright affect, conversant Respiratory: Breathing even and unlabored. No stridor or wheezing Extremities: Atraumatic, good range of motion. Skin: Warm, Dry, no rashes Musculoskeletal: Normal gait, Joints normal  ASSESMENT: 71 y.o.  male  s/p AP-Large lap-band.   PLAN: An upper GI has been ordered as his solid food dysphagia is a bit concerning. He reported that until Dr. Daphine Deutscher removed a small amount of fluid, his potassium tablet would get 'stuck' and cause trouble. I've asked him to be scheduled with Dr. Daphine Deutscher in the next month or two to more closely investigate his weight loss issues and his options.

## 2011-07-06 NOTE — Patient Instructions (Signed)
Obtain Upper GI. Return in 1-2 months with Dr. Daphine Deutscher.

## 2011-07-11 ENCOUNTER — Ambulatory Visit (INDEPENDENT_AMBULATORY_CARE_PROVIDER_SITE_OTHER): Payer: 59 | Admitting: *Deleted

## 2011-07-11 ENCOUNTER — Ambulatory Visit (INDEPENDENT_AMBULATORY_CARE_PROVIDER_SITE_OTHER): Payer: 59 | Admitting: Pulmonary Disease

## 2011-07-11 ENCOUNTER — Encounter: Payer: Self-pay | Admitting: Pulmonary Disease

## 2011-07-11 VITALS — BP 100/60 | HR 54 | Temp 97.6°F | Ht 67.0 in | Wt 286.2 lb

## 2011-07-11 DIAGNOSIS — J45909 Unspecified asthma, uncomplicated: Secondary | ICD-10-CM

## 2011-07-11 DIAGNOSIS — Z01811 Encounter for preprocedural respiratory examination: Secondary | ICD-10-CM

## 2011-07-11 DIAGNOSIS — Z7901 Long term (current) use of anticoagulants: Secondary | ICD-10-CM | POA: Diagnosis not present

## 2011-07-11 DIAGNOSIS — J4530 Mild persistent asthma, uncomplicated: Secondary | ICD-10-CM

## 2011-07-11 DIAGNOSIS — G4733 Obstructive sleep apnea (adult) (pediatric): Secondary | ICD-10-CM

## 2011-07-11 DIAGNOSIS — I2699 Other pulmonary embolism without acute cor pulmonale: Secondary | ICD-10-CM

## 2011-07-11 LAB — POCT INR: INR: 2.2

## 2011-07-11 NOTE — Patient Instructions (Signed)
Qvar two puffs twice per day, and rinse mouth after using Ventolin two puffs as needed for cough, wheeze, or chest congestion Follow up in 6 months (Will try to schedule for same day as your wife's follow up)

## 2011-07-11 NOTE — Progress Notes (Signed)
Chief Complaint  Patient presents with  . Follow-up    breathing has been fine. little wheezing in the AM's when he wakes up. denies any cough, chest ts  . Sleep Apnea    pt states he wears his bpap machine 4-5 night a week x 6.5 hrs a night. denies any problems w/ mask/machine--he just doesn't like wearng it    History of Present Illness: Carlos Dawson is a 71 y.o. male former smoker with dyspnea, OSA on BPAP 13/9, and mild/persistent asthma.  He has been using Qvar once per week.  However, he uses ventolin twice per day.  He gets wheezing in the morning and late evening.  This is better when he uses ventolin.  He is not having much cough, chest congestion, or sputum.  He uses BPAP on most nights.  He does not have any trouble with pressure from the machine.  He uses a full face mask.  His main difficulty is with his mask fit.  He is planning to have heel surgery with Dr. Lajoyce Corners in August.  Past Medical History  Diagnosis Date  . Hypertension   . Obesities, morbid   . Pulmonary embolism   . Anticoagulant long-term use   . CAD (coronary artery disease)     s/p BMS to 2nd OM in Sept 2012  . Thoracic aortic aneurysm     followed by Dr. [name]; last CT November 11, 2010 and unchanged  . OA (osteoarthritis)   . LVH (left ventricular hypertrophy)   . Paroxysmal atrial fibrillation   . Colonic polyp   . OSA (obstructive sleep apnea)     PSG 03/30/97 AHI 21, BPAP 13/9  . Mild intermittent asthma   . Hemorrhoids   . Generalized headaches   . SOB (shortness of breath)     using oxygen with exercise  . Diastolic dysfunction   . ASCVD (arteriosclerotic cardiovascular disease)     Prior BMS to the 2nd OM in September 2012; with repeat cath in October showing patency  . Sleep apnea   . GERD (gastroesophageal reflux disease)   . IBS (irritable bowel syndrome)   . Aortic root enlargement   . Ascending aortic aneurysm     recent scan in October 2012 showing no change; followed by Dr.  [name]  . PAF (paroxysmal atrial fibrillation)     treated with Coumadin  . Hearing loss   . Contact lens/glasses fitting     Past Surgical History  Procedure Date  . Hemorroidectomy   . Vasectomy   . Cervical spine surgery 06/02/2010    lower back and neck  . Gastric bypass 05/27/2007  . Cholecystectomy   . Rotator cuff repair     both  . Achilles tendon repair   . Cardiac catheterization 2006  . Coronary stent placement Sept 2012    2nd OM with BMS  . Cardiac catheterization October 2012    Stent patent  . Appendectomy     Current Outpatient Prescriptions on File Prior to Visit  Medication Sig Dispense Refill  . albuterol (VENTOLIN HFA) 108 (90 BASE) MCG/ACT inhaler Inhale 2 puffs into the lungs every 6 (six) hours as needed for wheezing.  1 Inhaler  2  . aspirin 81 MG tablet Take 81 mg by mouth daily.        Marland Kitchen atenolol (TENORMIN) 25 MG tablet Take 25 mg by mouth daily.        . beclomethasone (QVAR) 80 MCG/ACT inhaler Inhale 2 puffs into  the lungs 2 (two) times daily.  1 Inhaler  2  . CRESTOR 10 MG tablet TAKE 1 TABLET BY MOUTH AT BEDTIME  30 tablet  6  . finasteride (PROSCAR) 5 MG tablet Take 5 mg by mouth daily.        . fluticasone (FLONASE) 50 MCG/ACT nasal spray Place 2 sprays into the nose as needed.       . furosemide (LASIX) 80 MG tablet Take 1 1/2 tablets two times a day  90 tablet  11  . nitroGLYCERIN (NITROSTAT) 0.4 MG SL tablet Place 0.4 mg under the tongue every 5 (five) minutes as needed.        . NON FORMULARY Nettie Pot as needed      . pantoprazole (PROTONIX) 40 MG tablet Take 40 mg by mouth daily.       . potassium chloride SA (KLOR-CON M20) 20 MEQ tablet Take 2 tablets (40 mEq total) by mouth 2 (two) times daily.  120 tablet  5  . ranolazine (RANEXA) 500 MG 12 hr tablet Take 1 tablet (500 mg total) by mouth 2 (two) times daily.  60 tablet  5  . spironolactone (ALDACTONE) 25 MG tablet Take 1 tablet (25 mg total) by mouth daily.  30 tablet  5  . Tamsulosin  HCl (FLOMAX) 0.4 MG CAPS Take 0.4 mg by mouth daily.        . traMADol (ULTRAM) 50 MG tablet Take 50 mg by mouth every 6 (six) hours as needed.        Marland Kitchen VITAMIN D, CHOLECALCIFEROL, PO Take 4,000 mg/day by mouth daily.        Marland Kitchen warfarin (COUMADIN) 5 MG tablet Take as directed by Anticoagulation clinic.  30 tablet  3  . DISCONTD: valsartan (DIOVAN) 320 MG tablet Take 1/2 tablet twice daily  30 tablet  11  . DISCONTD: Rosuvastatin Calcium (CRESTOR PO) Take 10 mg by mouth daily.       Marland Kitchen DISCONTD: valsartan (DIOVAN) 160 MG tablet Take 1 tablet (160 mg total) by mouth daily.  60 tablet  11    Allergies  Allergen Reactions  . Morphine Itching  . Adhesive (Tape) Itching and Rash    Where applied.   History   Social History  . Marital Status: Married    Spouse Name: N/A    Number of Children: 3  . Years of Education: N/A   Occupational History  . Retired from [employer]    Social History Main Topics  . Smoking status: Former Smoker -- 1.5 packs/day for 30 years    Quit date: 01/24/1992  . Smokeless tobacco: Never Used   Comment: Stopped in 1983  . Alcohol Use: No  . Drug Use: No  . Sexually Active: Yes   Other Topics Concern  . Not on file   Social History Narrative  . No narrative on file   Family History  Problem Relation Age of Onset  . Heart disease Mother   . Diabetes Mother   . Other Mother     stent placement  . Emphysema Father 60  . Heart attack Sister      Physical Exam:  Blood pressure 100/60, pulse 54, temperature 97.6 F (36.4 C), temperature source Oral, height 5\' 7"  (1.702 m), weight 286 lb 3.2 oz (129.819 kg), SpO2 95.00%.  General - Obese  HEENT - No sinus tenderness, no oral exudate, no LAN  Cardiac - s1s2 regular, no murmur  Chest - normal respiratory excursion, no wheeze/rales/dullness  Abdomen - obese, soft  Extremities - +1 ankle edema  Skin - no rashes  Neurologic - normal strength  Psychiatric - normal mood, behavior  Lab Results    Component Value Date   WBC 6.1 11/15/2010   HGB 13.5 11/15/2010   HCT 39.3 11/15/2010   MCV 89.1 11/15/2010   PLT 148.0* 11/15/2010   Lab Results  Component Value Date   CREATININE 1.4 03/06/2011   BUN 21 03/06/2011   NA 139 03/06/2011   K 4.0 03/06/2011   CL 107 03/06/2011   CO2 28 03/06/2011   Lab Results  Component Value Date   ALT 20 05/20/2010   AST 20 05/20/2010   ALKPHOS 95 05/20/2010   BILITOT 0.8 05/20/2010   CT ANGIOGRAPHY CHEST WITH CONTRAST 11/11/10 Technique: Multidetector CT imaging of the chest was performed  using the standard protocol during bolus administration of  intravenous contrast. Multiplanar CT image reconstructions  including MIPs were obtained to evaluate the vascular anatomy.  Contrast: OMNIPAQUE IOHEXOL 350 MG/ML IV SOLN  Comparison: 09/08/2010  Findings:  Aneurysmal dilatation ascending thoracic aorta, 5.0 x 4.9 cm image  39.  No evidence of aortic dissection.  Coronary arterial calcifications noted.  Pulmonary arteries patent.  No evidence of pulmonary embolism.  No thoracic adenopathy.  Visualized portion of upper abdomen unremarkable.  Post gastric laparoscopic banding surgery.  Scattered end plate spur formation thoracic spine.  Minimal bibasilar atelectasis.  Lungs otherwise clear.  No pleural effusion or pneumothorax.  Review of the MIP images confirms the above findings.  IMPRESSION:  No evidence of pulmonary embolism.  Persistent aneurysmal dilatation of ascending thoracic aorta, 5.0 x  4.9 cm in size, not significantly change since previous exam.  Minimal dependent atelectasis in both lungs.  Original Report Authenticated By: Lollie Marrow, M.D.  PFT 12/20/10>>FEV1 2.13(79%), FEV1% 71, TLC 5.80(100%), DLCO 57%, no BD.  PSG 03/30/97 AHI 21  Assessment/Plan:  Outpatient Encounter Prescriptions as of 07/11/2011  Medication Sig Dispense Refill  . albuterol (VENTOLIN HFA) 108 (90 BASE) MCG/ACT inhaler Inhale 2 puffs into the  lungs every 6 (six) hours as needed for wheezing.  1 Inhaler  2  . aspirin 81 MG tablet Take 81 mg by mouth daily.        Marland Kitchen atenolol (TENORMIN) 25 MG tablet Take 25 mg by mouth daily.        . beclomethasone (QVAR) 80 MCG/ACT inhaler Inhale 2 puffs into the lungs 2 (two) times daily.  1 Inhaler  2  . CRESTOR 10 MG tablet TAKE 1 TABLET BY MOUTH AT BEDTIME  30 tablet  6  . finasteride (PROSCAR) 5 MG tablet Take 5 mg by mouth daily.        . fluticasone (FLONASE) 50 MCG/ACT nasal spray Place 2 sprays into the nose as needed.       . furosemide (LASIX) 80 MG tablet Take 1 1/2 tablets two times a day  90 tablet  11  . nitroGLYCERIN (NITROSTAT) 0.4 MG SL tablet Place 0.4 mg under the tongue every 5 (five) minutes as needed.        . NON FORMULARY Nettie Pot as needed      . pantoprazole (PROTONIX) 40 MG tablet Take 40 mg by mouth daily.       . potassium chloride SA (KLOR-CON M20) 20 MEQ tablet Take 2 tablets (40 mEq total) by mouth 2 (two) times daily.  120 tablet  5  . ranolazine (RANEXA) 500 MG 12 hr  tablet Take 1 tablet (500 mg total) by mouth 2 (two) times daily.  60 tablet  5  . spironolactone (ALDACTONE) 25 MG tablet Take 1 tablet (25 mg total) by mouth daily.  30 tablet  5  . Tamsulosin HCl (FLOMAX) 0.4 MG CAPS Take 0.4 mg by mouth daily.        . traMADol (ULTRAM) 50 MG tablet Take 50 mg by mouth every 6 (six) hours as needed.        . valsartan (DIOVAN) 320 MG tablet Once a day      . VITAMIN D, CHOLECALCIFEROL, PO Take 4,000 mg/day by mouth daily.        Marland Kitchen warfarin (COUMADIN) 5 MG tablet Take as directed by Anticoagulation clinic.  30 tablet  3  . DISCONTD: valsartan (DIOVAN) 320 MG tablet Take 1/2 tablet twice daily  30 tablet  11  . DISCONTD: ranitidine (ZANTAC) 300 MG tablet Take 1 tablet (300 mg total) by mouth at bedtime.  30 tablet  11  . DISCONTD: Rosuvastatin Calcium (CRESTOR PO) Take 10 mg by mouth daily.         Katora Fini Pager:  [phone] 07/11/2011, 9:53 AM

## 2011-07-11 NOTE — Assessment & Plan Note (Signed)
He was confused about his inhaler use.  Advised him to use Qvar two puffs bid, and ventolin as needed.

## 2011-07-11 NOTE — Assessment & Plan Note (Signed)
He is being evaluated for heel surgery.  I think he would be at low risk for peri-operative respiratory complications, and can proceed with surgery.  He should continue Qvar and Ventolin during the peri-operative period.  He will need to continue BPAP in the post-operative period.  He should have close monitoring of his oxygenation post-operatively until he is fully recovered from effects of sedation.  Pulmonary service can be available as needed after surgery.  Please call if there are additional questions.

## 2011-07-11 NOTE — Assessment & Plan Note (Signed)
He is compliant and reports benefit from therapy.  He is having trouble with his mask.  Advised him to look on line for options of alternative full face masks.  He is to call if he finds an alternative he is interested in.

## 2011-07-12 ENCOUNTER — Other Ambulatory Visit (INDEPENDENT_AMBULATORY_CARE_PROVIDER_SITE_OTHER): Payer: Self-pay | Admitting: Physician Assistant

## 2011-07-12 ENCOUNTER — Ambulatory Visit
Admission: RE | Admit: 2011-07-12 | Discharge: 2011-07-12 | Disposition: A | Discharge status: Home / Self Care | Payer: 59 | Source: Ambulatory Visit | Attending: Physician Assistant | Admitting: Physician Assistant

## 2011-07-13 ENCOUNTER — Ambulatory Visit
Admission: RE | Admit: 2011-07-13 | Discharge: 2011-07-13 | Disposition: A | Discharge status: Home / Self Care | Payer: 59 | Source: Ambulatory Visit | Attending: Physician Assistant | Admitting: Physician Assistant

## 2011-07-13 ENCOUNTER — Inpatient Hospital Stay: Admission: RE | Admit: 2011-07-13 | Payer: 59 | Source: Ambulatory Visit

## 2011-07-13 DIAGNOSIS — K2289 Other specified disease of esophagus: Secondary | ICD-10-CM | POA: Diagnosis not present

## 2011-07-13 DIAGNOSIS — K228 Other specified diseases of esophagus: Secondary | ICD-10-CM | POA: Diagnosis not present

## 2011-08-07 ENCOUNTER — Other Ambulatory Visit: Payer: Self-pay | Admitting: Cardiothoracic Surgery

## 2011-08-07 DIAGNOSIS — I712 Thoracic aortic aneurysm, without rupture, unspecified: Secondary | ICD-10-CM

## 2011-08-08 ENCOUNTER — Ambulatory Visit (INDEPENDENT_AMBULATORY_CARE_PROVIDER_SITE_OTHER): Payer: 59 | Admitting: *Deleted

## 2011-08-08 DIAGNOSIS — Z7901 Long term (current) use of anticoagulants: Secondary | ICD-10-CM | POA: Diagnosis not present

## 2011-08-08 DIAGNOSIS — I2699 Other pulmonary embolism without acute cor pulmonale: Secondary | ICD-10-CM

## 2011-08-08 LAB — POCT INR: INR: 1.3

## 2011-08-09 DIAGNOSIS — Z86010 Personal history of colonic polyps: Secondary | ICD-10-CM | POA: Diagnosis not present

## 2011-08-09 DIAGNOSIS — Z09 Encounter for follow-up examination after completed treatment for conditions other than malignant neoplasm: Secondary | ICD-10-CM | POA: Diagnosis not present

## 2011-08-14 ENCOUNTER — Telehealth: Payer: Self-pay | Admitting: *Deleted

## 2011-08-14 NOTE — Telephone Encounter (Signed)
Message copied by Carmela Hurt on Mon Aug 14, 2011 11:04 AM ------      Message from: [name], [name]      Created: Thu Aug 10, 2011  9:21 AM      Regarding: RE: procedures       May hold coumadin for 5 days prior to procedure without Lovenox bridging.      Peter [name] MD, [facility]                  ----- Message -----         From: Carmela Hurt, RN         Sent: 08/08/2011  10:00 AM           To: [name] [name], MD, Carmela Hurt, RN, #      Subject: procedures                                               Patient here today and informs [place] he is having colonoscopy tomorrow and was instructed by Dr Janeece Fitting [facility]) to hold coumadin 4 days prior to procedure on 08/09/11. He has done this. He is having another procedure on 08/25/2011 by Dr Lajoyce Corners, foot surgery, I need clearance to hold coumadin 5 days prior to procedure on 08/25/2011, ?lovenox bridging.             Thanks, Addison Lank, RN

## 2011-08-14 NOTE — Telephone Encounter (Signed)
Called and left message on patient answering machine, informed patient Dr [name] responded and instructed to hold coumadin 5 days prior to procedure patient is having on 08/25/11 by Dr Lajoyce Corners, pt has a rov to [facility] 08/16/11.

## 2011-08-16 ENCOUNTER — Ambulatory Visit (INDEPENDENT_AMBULATORY_CARE_PROVIDER_SITE_OTHER): Payer: 59 | Admitting: *Deleted

## 2011-08-16 DIAGNOSIS — Z7901 Long term (current) use of anticoagulants: Secondary | ICD-10-CM | POA: Diagnosis not present

## 2011-08-16 DIAGNOSIS — I2699 Other pulmonary embolism without acute cor pulmonale: Secondary | ICD-10-CM | POA: Diagnosis not present

## 2011-08-16 LAB — POCT INR: INR: 1.8

## 2011-08-24 DIAGNOSIS — M7660 Achilles tendinitis, unspecified leg: Secondary | ICD-10-CM | POA: Diagnosis not present

## 2011-08-24 DIAGNOSIS — G8918 Other acute postprocedural pain: Secondary | ICD-10-CM | POA: Diagnosis not present

## 2011-08-24 DIAGNOSIS — M19079 Primary osteoarthritis, unspecified ankle and foot: Secondary | ICD-10-CM | POA: Diagnosis not present

## 2011-08-24 DIAGNOSIS — M6240 Contracture of muscle, unspecified site: Secondary | ICD-10-CM | POA: Diagnosis not present

## 2011-08-24 DIAGNOSIS — M928 Other specified juvenile osteochondrosis: Secondary | ICD-10-CM | POA: Diagnosis not present

## 2011-09-04 ENCOUNTER — Ambulatory Visit (INDEPENDENT_AMBULATORY_CARE_PROVIDER_SITE_OTHER): Payer: 59 | Admitting: Pharmacist

## 2011-09-04 DIAGNOSIS — I2699 Other pulmonary embolism without acute cor pulmonale: Secondary | ICD-10-CM

## 2011-09-04 DIAGNOSIS — Z7901 Long term (current) use of anticoagulants: Secondary | ICD-10-CM | POA: Diagnosis not present

## 2011-09-04 LAB — POCT INR: INR: 1.8

## 2011-09-10 ENCOUNTER — Emergency Department (HOSPITAL_COMMUNITY)
Admission: EM | Admit: 2011-09-10 | Discharge: 2011-09-10 | Disposition: A | Discharge status: Home / Self Care | Payer: 59 | Attending: Emergency Medicine | Admitting: Emergency Medicine

## 2011-09-10 ENCOUNTER — Encounter (HOSPITAL_COMMUNITY): Payer: Self-pay | Admitting: *Deleted

## 2011-09-10 DIAGNOSIS — I10 Essential (primary) hypertension: Secondary | ICD-10-CM | POA: Insufficient documentation

## 2011-09-10 DIAGNOSIS — M7989 Other specified soft tissue disorders: Secondary | ICD-10-CM | POA: Diagnosis not present

## 2011-09-10 DIAGNOSIS — K219 Gastro-esophageal reflux disease without esophagitis: Secondary | ICD-10-CM | POA: Diagnosis not present

## 2011-09-10 DIAGNOSIS — Z86718 Personal history of other venous thrombosis and embolism: Secondary | ICD-10-CM | POA: Insufficient documentation

## 2011-09-10 DIAGNOSIS — M79609 Pain in unspecified limb: Secondary | ICD-10-CM | POA: Diagnosis not present

## 2011-09-10 DIAGNOSIS — R791 Abnormal coagulation profile: Secondary | ICD-10-CM

## 2011-09-10 DIAGNOSIS — Z7901 Long term (current) use of anticoagulants: Secondary | ICD-10-CM | POA: Insufficient documentation

## 2011-09-10 DIAGNOSIS — Z9884 Bariatric surgery status: Secondary | ICD-10-CM | POA: Diagnosis not present

## 2011-09-10 DIAGNOSIS — Z86711 Personal history of pulmonary embolism: Secondary | ICD-10-CM | POA: Diagnosis not present

## 2011-09-10 DIAGNOSIS — Z79899 Other long term (current) drug therapy: Secondary | ICD-10-CM | POA: Insufficient documentation

## 2011-09-10 DIAGNOSIS — I2510 Atherosclerotic heart disease of native coronary artery without angina pectoris: Secondary | ICD-10-CM | POA: Diagnosis not present

## 2011-09-10 DIAGNOSIS — I4891 Unspecified atrial fibrillation: Secondary | ICD-10-CM | POA: Diagnosis not present

## 2011-09-10 DIAGNOSIS — Z9861 Coronary angioplasty status: Secondary | ICD-10-CM | POA: Insufficient documentation

## 2011-09-10 DIAGNOSIS — Z9119 Patient's noncompliance with other medical treatment and regimen: Secondary | ICD-10-CM | POA: Diagnosis not present

## 2011-09-10 DIAGNOSIS — M25469 Effusion, unspecified knee: Secondary | ICD-10-CM | POA: Diagnosis not present

## 2011-09-10 DIAGNOSIS — R509 Fever, unspecified: Secondary | ICD-10-CM | POA: Diagnosis not present

## 2011-09-10 DIAGNOSIS — G8918 Other acute postprocedural pain: Secondary | ICD-10-CM

## 2011-09-10 DIAGNOSIS — K589 Irritable bowel syndrome without diarrhea: Secondary | ICD-10-CM | POA: Insufficient documentation

## 2011-09-10 DIAGNOSIS — Z87891 Personal history of nicotine dependence: Secondary | ICD-10-CM | POA: Diagnosis not present

## 2011-09-10 LAB — BASIC METABOLIC PANEL
BUN: 16 mg/dL (ref 6–23)
CO2: 23 mEq/L (ref 19–32)
Calcium: 9.1 mg/dL (ref 8.4–10.5)
Chloride: 100 mEq/L (ref 96–112)
Creatinine, Ser: 1.12 mg/dL (ref 0.50–1.35)
GFR calc Af Amer: 76 mL/min — ABNORMAL LOW (ref >90)
GFR calc non Af Amer: 65 mL/min — ABNORMAL LOW (ref >90)
Glucose, Bld: 156 mg/dL — ABNORMAL HIGH (ref 70–99)
Potassium: 3.3 mEq/L — ABNORMAL LOW (ref 3.5–5.1)
Sodium: 136 mEq/L (ref 135–145)

## 2011-09-10 LAB — CBC WITH DIFFERENTIAL/PLATELET
Basophils Absolute: 0 10*3/uL (ref 0.0–0.1)
Basophils Relative: 0 % (ref 0–1)
Eosinophils Absolute: 0 10*3/uL (ref 0.0–0.7)
Eosinophils Relative: 0 % (ref 0–5)
HCT: 40 % (ref 39.0–52.0)
Hemoglobin: 13.6 g/dL (ref 13.0–17.0)
Lymphocytes Relative: 10 % — ABNORMAL LOW (ref 12–46)
Lymphs Abs: 1.1 10*3/uL (ref 0.7–4.0)
MCH: 29.6 pg (ref 26.0–34.0)
MCHC: 34 g/dL (ref 30.0–36.0)
MCV: 87.1 fL (ref 78.0–100.0)
Monocytes Absolute: 0.5 10*3/uL (ref 0.1–1.0)
Monocytes Relative: 5 % (ref 3–12)
Neutro Abs: 9.1 10*3/uL — ABNORMAL HIGH (ref 1.7–7.7)
Neutrophils Relative %: 84 % — ABNORMAL HIGH (ref 43–77)
Platelets: 156 10*3/uL (ref 150–400)
RBC: 4.59 MIL/uL (ref 4.22–5.81)
RDW: 13.7 % (ref 11.5–15.5)
WBC: 10.8 10*3/uL — ABNORMAL HIGH (ref 4.0–10.5)

## 2011-09-10 LAB — PROTIME-INR
INR: 1.51 — ABNORMAL HIGH (ref 0.00–1.49)
Prothrombin Time: 18.5 seconds — ABNORMAL HIGH (ref 11.6–15.2)

## 2011-09-10 MED ORDER — HYDROMORPHONE HCL PF 1 MG/ML IJ SOLN
1.0000 mg | Freq: Once | INTRAMUSCULAR | Status: DC
  Filled 2011-09-10: qty 1

## 2011-09-10 MED ORDER — ONDANSETRON HCL 4 MG/2ML IJ SOLN: 4.0000 mg | Freq: Once | INTRAMUSCULAR | Status: DC

## 2011-09-10 MED ORDER — ONDANSETRON HCL 4 MG/2ML IJ SOLN
4.0000 mg | Freq: Once | INTRAMUSCULAR | Status: AC
  Administered 2011-09-10: 4 mg via INTRAMUSCULAR
  Filled 2011-09-10: qty 2

## 2011-09-10 MED ORDER — ONDANSETRON HCL 4 MG/2ML IJ SOLN
4.0000 mg | Freq: Once | INTRAMUSCULAR | Status: DC
  Filled 2011-09-10: qty 2

## 2011-09-10 MED ORDER — CEPHALEXIN 500 MG PO CAPS: 500.0000 mg | ORAL_CAPSULE | Freq: Four times a day (QID) | ORAL | Status: AC

## 2011-09-10 MED ORDER — ACETAMINOPHEN 500 MG PO TABS
1000.0000 mg | ORAL_TABLET | Freq: Once | ORAL | Status: AC
  Administered 2011-09-10: 1000 mg via ORAL
  Filled 2011-09-10 (×2): qty 2

## 2011-09-10 MED ORDER — HYDROMORPHONE HCL PF 1 MG/ML IJ SOLN
1.0000 mg | Freq: Once | INTRAMUSCULAR | Status: AC
  Administered 2011-09-10: 1 mg via INTRAMUSCULAR
  Filled 2011-09-10: qty 1

## 2011-09-10 NOTE — Progress Notes (Signed)
VASCULAR LAB PRELIMINARY  PRELIMINARY  PRELIMINARY  PRELIMINARY  Right lower extremity venous Doppler completed.    Preliminary report:  There is no DVT or SVT noted in the right lower extremity.    Carlos Dawson, 09/10/2011, 2:25 PM

## 2011-09-10 NOTE — ED Notes (Signed)
Unable to gain iv access x 2, iv team paged

## 2011-09-10 NOTE — Discharge Instructions (Signed)
Return to the ED with any concerns including increased swelling, increased redness or drainage from wound, decreased level of alertness/lethargy, or any other alarming symptoms  You should also be sure to increase your coumadin dose to 5mg  by mouth daily (7 days a week), and arrange to have your INR rechecked

## 2011-09-10 NOTE — ED Notes (Addendum)
Pt had stretching of achilles tendon and removal of bone spur the first of the month. Pt reports increased swelling, pain, and redness to right lower calf. Surgery site noted and area is discolored, calf tight. Pt reports mild fever this am, and pt took tylenol this am. Was 101.2. Pt sent to ED for further eval.

## 2011-09-11 ENCOUNTER — Other Ambulatory Visit: Payer: Self-pay | Admitting: Cardiothoracic Surgery

## 2011-09-11 DIAGNOSIS — I712 Thoracic aortic aneurysm, without rupture: Secondary | ICD-10-CM | POA: Diagnosis not present

## 2011-09-11 NOTE — ED Provider Notes (Signed)
History     CSN: [id number]  Arrival date & time 09/10/11  1149   First MD Initiated Contact with Patient 09/10/11 1302      Chief Complaint  Patient presents with  . Leg Pain  . Leg Swelling    (Consider location/radiation/quality/duration/timing/severity/associated sxs/prior treatment) HPI Patient presents with complaint of pain in his right lower extremity. He had a surgical procedure approximately 3 weeks ago involving the stretching of his Achilles tendon and removal of a bone spur. He states that since the time of the surgery he has had pain and swelling of his lower extremity. He was seen by Dr. Lajoyce Corners in followup 3 days ago and had stitches removed. He states that on this rechecked he was told that his postsurgical course the been going normally. He states that there's been no increase in pain today and no increase in redness or swelling. However he was tired of feeling the pain which prompted evaluation today. He also reports mild fever this morning of 1 at 1.2 at home. Pain is worse with movement and palpation. He denies cough burning with urination or other localizing symptoms of fever. He has a history of DVT and is currently taking Coumadin.  There are no other associated systemic symptoms, there are no other alleviating or modifying factors.   Past Medical History  Diagnosis Date  . Hypertension   . Obesities, morbid   . Pulmonary embolism   . Anticoagulant long-term use   . CAD (coronary artery disease)     s/p BMS to 2nd OM in Sept 2012  . Thoracic aortic aneurysm     followed by Dr. [name]; last CT November 11, 2010 and unchanged  . OA (osteoarthritis)   . LVH (left ventricular hypertrophy)   . Paroxysmal atrial fibrillation   . Colonic polyp   . OSA (obstructive sleep apnea)     PSG 03/30/97 AHI 21, BPAP 13/9  . Mild intermittent asthma   . Hemorrhoids   . Generalized headaches   . SOB (shortness of breath)     using oxygen with exercise  . Diastolic  dysfunction   . ASCVD (arteriosclerotic cardiovascular disease)     Prior BMS to the 2nd OM in September 2012; with repeat cath in October showing patency  . Sleep apnea   . GERD (gastroesophageal reflux disease)   . IBS (irritable bowel syndrome)   . Aortic root enlargement   . Ascending aortic aneurysm     recent scan in October 2012 showing no change; followed by Dr. [name]  . PAF (paroxysmal atrial fibrillation)     treated with Coumadin  . Hearing loss   . Contact lens/glasses fitting     Past Surgical History  Procedure Date  . Hemorroidectomy   . Vasectomy   . Cervical spine surgery 06/02/2010    lower back and neck  . Gastric bypass 05/27/2007  . Cholecystectomy   . Rotator cuff repair     both  . Achilles tendon repair   . Cardiac catheterization 2006  . Coronary stent placement Sept 2012    2nd OM with BMS  . Cardiac catheterization October 2012    Stent patent  . Appendectomy     Family History  Problem Relation Age of Onset  . Heart disease Mother   . Diabetes Mother   . Other Mother     stent placement  . Emphysema Father 64  . Heart attack Sister     History  Substance Use Topics  .  Smoking status: Former Smoker -- 1.5 packs/day for 30 years    Quit date: 01/24/1992  . Smokeless tobacco: Never Used   Comment: Stopped in 1983  . Alcohol Use: No      Review of Systems ROS reviewed and all otherwise negative except for mentioned in HPI  Allergies  Morphine and Adhesive  Home Medications   Current Outpatient Rx  Name Route Sig Dispense Refill  . ALBUTEROL SULFATE HFA 108 (90 BASE) MCG/ACT IN AERS Inhalation Inhale 2 puffs into the lungs every 6 (six) hours as needed. For shortness of breath    . ASPIRIN 81 MG PO TABS Oral Take 81 mg by mouth daily.     . ATENOLOL 25 MG PO TABS Oral Take 25 mg by mouth daily.      . BECLOMETHASONE DIPROPIONATE 80 MCG/ACT IN AERS Inhalation Inhale 2 puffs into the lungs 2 (two) times daily. 1 Inhaler 2  .  FINASTERIDE 5 MG PO TABS Oral Take 5 mg by mouth daily.      Marland Kitchen FLUTICASONE PROPIONATE 50 MCG/ACT NA SUSP Nasal Place 2 sprays into the nose as needed. For allergies    . FUROSEMIDE 80 MG PO TABS  Take 1 1/2 tablets two times a day 90 tablet 11  . PANTOPRAZOLE SODIUM 40 MG PO TBEC Oral Take 40 mg by mouth daily.     Marland Kitchen POTASSIUM CHLORIDE CRYS ER 20 MEQ PO TBCR Oral Take 2 tablets (40 mEq total) by mouth 2 (two) times daily. 120 tablet 5  . RANOLAZINE ER 500 MG PO TB12 Oral Take 1 tablet (500 mg total) by mouth 2 (two) times daily. 60 tablet 5    90 day supply is acceptable  . ROSUVASTATIN CALCIUM 10 MG PO TABS Oral Take 10 mg by mouth at bedtime.    . SPIRONOLACTONE 25 MG PO TABS Oral Take 1 tablet (25 mg total) by mouth daily. 30 tablet 5  . TAMSULOSIN HCL 0.4 MG PO CAPS Oral Take 0.4 mg by mouth daily.      . TRAMADOL HCL 50 MG PO TABS Oral Take 50 mg by mouth every 6 (six) hours as needed.      Marland Kitchen VALSARTAN 160 MG PO TABS Oral Take 160 mg by mouth daily.    Marland Kitchen VITAMIN D (CHOLECALCIFEROL) PO Oral Take 4,000 mg/day by mouth daily.      . WARFARIN SODIUM 5 MG PO TABS Oral Take 2.5-5 mg by mouth at bedtime. 5mg  every day except take 2.5mg  on Friday    . CEPHALEXIN 500 MG PO CAPS Oral Take 1 capsule (500 mg total) by mouth 4 (four) times daily. 28 capsule 0  . NITROGLYCERIN 0.4 MG SL SUBL Sublingual Place 0.4 mg under the tongue every 5 (five) minutes as needed. For chest pain      BP 109/72  Pulse 77  Temp 100 F (37.8 C) (Oral)  Resp 20  SpO2 95% Vitals reviewed Physical Exam Physical Examination: General appearance - alert, well appearing, and in no distress Mental status - alert, oriented to person, place, and time Mouth - mucous membranes moist, pharynx normal without lesions Chest - clear to auscultation, no wheezes, rales or rhonchi, symmetric air entry Heart - normal rate, regular rhythm, normal S1, S2, no murmurs, rubs, clicks or gallops Abdomen - soft, nontender, nondistended, no  masses or organomegaly Musculoskeletal - no joint tenderness or deformity Extremities - RLE with area of erythema overlying right medial distal calf with tenderness to palpation, no  pitting edema, extremities otherwise peripheral pulses normal, no pedal edema, no clubbing or cyanosis Skin - normal coloration and turgor, no rashes, no suspicious skin lesions noted  ED Course  Procedures (including critical care time)  Labs Reviewed  CBC WITH DIFFERENTIAL - Abnormal; Notable for the following:    WBC 10.8 (*)     Neutrophils Relative 84 (*)     Neutro Abs 9.1 (*)     Lymphocytes Relative 10 (*)     All other components within normal limits  BASIC METABOLIC PANEL - Abnormal; Notable for the following:    Potassium 3.3 (*)     Glucose, Bld 156 (*)     GFR calc non Af Amer 65 (*)     GFR calc Af Amer 76 (*)     All other components within normal limits  PROTIME-INR - Abnormal; Notable for the following:    Prothrombin Time 18.5 (*)     INR 1.51 (*)     All other components within normal limits  LAB REPORT - SCANNED   No results found.   1. Postoperative pain   2. Subtherapeutic international normalized ratio (INR)       MDM  Patient presents approximately 3 weeks after her orthopedic surgical procedure on distal right lower extremity. He presents with pain swelling and redness over the surgical site. He states this has been present since the surgery however the pain is persistent which prompted ED evaluation today. An ultrasound of the right lower extremity did not show any evidence of DVT. Patient is on Coumadin but his INR is subtherapeutic. He was advised to increase his dose to 5 mg every day. I have started him on oral antibiotics in case of possible cellulitis overlying the surgical site. The patient is overall nontoxic and well-hydrated in appearance. He is discharged with strict return precautions and will arrange for followup with orthopedics.        Ethelda Chick,  MD 09/11/11 [phone]

## 2011-09-12 LAB — CREATININE, SERUM: Creat: 1.78 mg/dL — ABNORMAL HIGH (ref 0.50–1.35)

## 2011-09-12 LAB — BUN: BUN: 25 mg/dL — ABNORMAL HIGH (ref 6–23)

## 2011-09-13 ENCOUNTER — Encounter (INDEPENDENT_AMBULATORY_CARE_PROVIDER_SITE_OTHER): Payer: 59 | Admitting: Surgery

## 2011-09-14 ENCOUNTER — Encounter: Payer: Self-pay | Admitting: Cardiothoracic Surgery

## 2011-09-14 ENCOUNTER — Ambulatory Visit (INDEPENDENT_AMBULATORY_CARE_PROVIDER_SITE_OTHER): Payer: 59 | Admitting: Cardiothoracic Surgery

## 2011-09-14 ENCOUNTER — Ambulatory Visit
Admission: RE | Admit: 2011-09-14 | Discharge: 2011-09-14 | Disposition: A | Discharge status: Home / Self Care | Payer: 59 | Source: Ambulatory Visit | Attending: Cardiothoracic Surgery | Admitting: Cardiothoracic Surgery

## 2011-09-14 VITALS — BP 107/64 | HR 65 | Resp 20 | Ht 67.0 in | Wt 282.0 lb

## 2011-09-14 DIAGNOSIS — I712 Thoracic aortic aneurysm, without rupture, unspecified: Secondary | ICD-10-CM

## 2011-09-14 DIAGNOSIS — J984 Other disorders of lung: Secondary | ICD-10-CM | POA: Diagnosis not present

## 2011-09-14 MED ORDER — IOHEXOL 350 MG/ML SOLN
80.0000 mL | Freq: Once | INTRAVENOUS | Status: AC | PRN
  Administered 2011-09-14: 80 mL via INTRAVENOUS

## 2011-09-14 NOTE — Progress Notes (Signed)
[address]            [name] [address]          [phone]      Carlos Dawson [facility] Medical Record #[id number] Date of Birth: 16-Mar-1940  Referring: [name], [name], MD Primary Care: [name], MD  Chief Complaint:    Chief Complaint  Patient presents with  . Follow-up    1 year F/U with CTA Chest, surveillance of ascending aortic aneurysm     History of Present Illness:    Patient returns today for followup CT scan one year after previous scan for evaluation of dilated descending aorta. On his last visit a year ago he was having anginal chest pain and shortness of breath and was referred back to Dr. Peter [name]. A stent was placed in his circumflex coronary artery at that time. He denies any angina at this time but does have some exertional shortness of breath. Recently had orthopedic surgery on the right lower leg which is his primary complaint today      Current Activity/ Functional Status: Patient is not independent with mobility/ambulation, transfers, ADL's, IADL's.   Past Medical History  Diagnosis Date  . Hypertension   . Obesities, morbid   . Pulmonary embolism   . Anticoagulant long-term use   . CAD (coronary artery disease)     s/p BMS to 2nd OM in Sept 2012  . Thoracic aortic aneurysm     followed by Dr. [name]; last CT November 11, 2010 and unchanged  . OA (osteoarthritis)   . LVH (left ventricular hypertrophy)   . Paroxysmal atrial fibrillation   . Colonic polyp   . OSA (obstructive sleep apnea)     PSG 03/30/97 AHI 21, BPAP 13/9  . Mild intermittent asthma   . Hemorrhoids   . Generalized headaches   . SOB (shortness of breath)     using oxygen with exercise  . Diastolic dysfunction   . ASCVD (arteriosclerotic cardiovascular disease)     Prior BMS to the 2nd OM in September 2012; with repeat cath in October showing patency  . Sleep apnea   . GERD (gastroesophageal reflux disease)   . IBS  (irritable bowel syndrome)   . Aortic root enlargement   . Ascending aortic aneurysm     recent scan in October 2012 showing no change; followed by Dr. [name]  . PAF (paroxysmal atrial fibrillation)     treated with Coumadin  . Hearing loss   . Contact lens/glasses fitting     Past Surgical History  Procedure Date  . Hemorroidectomy   . Vasectomy   . Cervical spine surgery 06/02/2010    lower back and neck  . Gastric bypass 05/27/2007  . Cholecystectomy   . Rotator cuff repair     both  . Achilles tendon repair   . Cardiac catheterization 2006  . Coronary stent placement Sept 2012    2nd OM with BMS  . Cardiac catheterization October 2012    Stent patent  . Appendectomy     Family History  Problem Relation Age of Onset  . Heart disease Mother   . Diabetes Mother   . Other Mother     stent placement  . Emphysema Father 2  . Heart attack Sister     History   Social History  . Marital Status: Married  Spouse Name: N/A    Number of Children: 3  . Years of Education: N/A   Occupational History  . Retired from [employer]    Social History Main Topics  . Smoking status: Former Smoker -- 1.5 packs/day for 30 years    Quit date: 01/24/1992  . Smokeless tobacco: Never Used   Comment: Stopped in 1983  . Alcohol Use: No  . Drug Use: No  . Sexually Active: Yes   Other Topics Concern  . Not on file   Social History Narrative  . No narrative on file    History  Smoking status  . Former Smoker -- 1.5 packs/day for 30 years  . Quit date: 01/24/1992  Smokeless tobacco  . Never Used  Comment: Stopped in 1983    History  Alcohol Use No     Allergies  Allergen Reactions  . Morphine Itching  . Adhesive (Tape) Itching and Rash    Where applied.    Current Outpatient Prescriptions  Medication Sig Dispense Refill  . albuterol (PROVENTIL HFA;VENTOLIN HFA) 108 (90 BASE) MCG/ACT inhaler Inhale 2 puffs into the lungs every 6 (six) hours as needed. For shortness  of breath      . aspirin 81 MG tablet Take 81 mg by mouth daily.       Marland Kitchen atenolol (TENORMIN) 25 MG tablet Take 25 mg by mouth daily.        . beclomethasone (QVAR) 80 MCG/ACT inhaler Inhale 2 puffs into the lungs 2 (two) times daily.  1 Inhaler  2  . cephALEXin (KEFLEX) 500 MG capsule Take 1 capsule (500 mg total) by mouth 4 (four) times daily.  28 capsule  0  . finasteride (PROSCAR) 5 MG tablet Take 5 mg by mouth daily.        . fluticasone (FLONASE) 50 MCG/ACT nasal spray Place 2 sprays into the nose as needed. For allergies      . furosemide (LASIX) 80 MG tablet Take 1 1/2 tablets two times a day  90 tablet  11  . nitroGLYCERIN (NITROSTAT) 0.4 MG SL tablet Place 0.4 mg under the tongue every 5 (five) minutes as needed. For chest pain      . pantoprazole (PROTONIX) 40 MG tablet Take 40 mg by mouth daily.       . potassium chloride SA (KLOR-CON M20) 20 MEQ tablet Take 2 tablets (40 mEq total) by mouth 2 (two) times daily.  120 tablet  5  . ranolazine (RANEXA) 500 MG 12 hr tablet Take 1 tablet (500 mg total) by mouth 2 (two) times daily.  60 tablet  5  . rosuvastatin (CRESTOR) 10 MG tablet Take 10 mg by mouth at bedtime.      Marland Kitchen spironolactone (ALDACTONE) 25 MG tablet Take 1 tablet (25 mg total) by mouth daily.  30 tablet  5  . Tamsulosin HCl (FLOMAX) 0.4 MG CAPS Take 0.4 mg by mouth daily.        . traMADol (ULTRAM) 50 MG tablet Take 50 mg by mouth every 6 (six) hours as needed.        . valsartan (DIOVAN) 160 MG tablet Take 160 mg by mouth daily.      Marland Kitchen VITAMIN D, CHOLECALCIFEROL, PO Take 4,000 mg/day by mouth daily.        Marland Kitchen warfarin (COUMADIN) 5 MG tablet Take 2.5-5 mg by mouth at bedtime. 5mg  every day except take 2.5mg  on Friday       No current facility-administered medications for this  visit.   Facility-Administered Medications Ordered in Other Visits  Medication Dose Route Frequency Provider Last Rate Last Dose  . iohexol (OMNIPAQUE) 350 MG/ML injection 80 mL  80 mL Intravenous Once  PRN Juline Patch, MD   80 mL at 09/14/11 1140       Review of Systems:     Cardiac Review of Systems: Y or N  Chest Pain [  n  ]  Resting SOB [n   ] Exertional SOB  Cove.Etienne  ]  Orthopnea [ n ]   Pedal Edema [  n ]    Palpitations [n  ] Syncope  [ n]   Presyncope [ n  ]  General Review of Systems: [Y] = yes [  ]=no Constitional: recent weight change [ y]; anorexia [  ]; fatigue [ y ]; nausea [n  ]; night sweats [ n ]; fever [ n ]; or chills [n  ];                                                                                                                                          Dental: poor dentition[  ];   Eye : blurred vision [  ]; diplopia [   ]; vision changes [  ];  Amaurosis fugax[ n ]; Resp: cough [  ];  wheezing[  ];  hemoptysis[  ]; shortness of breath[  ]; paroxysmal nocturnal dyspnea[  ]; dyspnea on exertion[  ]; or orthopnea[  ];  GI:  gallstones[  ], vomiting[  ];  dysphagia[  ]; melena[  ];  hematochezia [ n ]; heartburn[  ];   Hx of  Colonoscopy[  ]; GU: kidney stones [  ]; hematuria[  ];   dysuria [  ];  nocturia[  ];  history of     obstruction [  ];             Skin: rash, swelling[  ];, hair loss[  ];  peripheral edema[  ];  or itching[  ]; Musculosketetal: myalgias[  ];  joint swelling[  ];  joint erythema[  ];  joint pain[  ];  back pain[  ];  Heme/Lymph: bruising[  ];  bleeding[  ];  anemia[  ];  Neuro: TIA[  ];  headaches[  ];  stroke[  ];  vertigo[  ];  seizures[  ];   paresthesias[  ];  difficulty walking[  ];  Psych:depression[  ]; anxiety[  ];  Endocrine: diabetes[  ];  thyroid dysfunction[  ];  Immunizations: Flu Cove.Etienne  ]; Pneumococcal[ n ];  Other:  Physical Exam: BP 107/64  Pulse 65  Resp 20  Ht 5\' 7"  (1.702 m)  Wt 282 lb (127.914 kg)  BMI 44.17 kg/m2  SpO2 95%  General appearance: alert, cooperative, appears older than stated age and fatigued Neurologic: intact Heart: regular rate and rhythm, S1, S2 normal,  no murmur, click, rub or gallop and normal  apical impulse Lungs: clear to auscultation bilaterally and normal percussion bilaterally Abdomen: soft, non-tender; bowel sounds normal; no masses,  no organomegaly Extremities: Patient's right lower leg is swollen tender with a healing posterior incision from recent orthopedic surgery    Diagnostic Studies & Laboratory data:     Recent Radiology Findings:   Ct Angio Chest W/cm &/or Wo Cm  09/14/2011  *RADIOLOGY REPORT*  Clinical Data: Follow up thoracic aortic aneurysm  CT ANGIOGRAPHY CHEST  Technique:  Multidetector CT imaging of the chest using the standard protocol during bolus administration of intravenous contrast. Multiplanar reconstructed images including MIPs were obtained and reviewed to evaluate the vascular anatomy.  Contrast: 80mL OMNIPAQUE IOHEXOL 350 MG/ML SOLN  Comparison: 11/11/2010  Findings:  There are no enlarged axillary or supraclavicular lymph nodes.  No mediastinal or hilar adenopathy.  No pericardial or pleural effusion identified.  The main pulmonary artery appears increased in caliber measuring 4.1 cm.  The proximal ascending thoracic aorta measures 4.7 cm, image 60.  This is stable from previous exam.  At the level of the aortic arch the thoracic aorta measures 3.7 cm, image 32.  This is unchanged from previous exam.  The descending thoracic aorta has a maximum transverse dimension of 2.9 cm, image 54.  Also stable from previous exam.  No pericardial or pleural effusion noted.  Stents are noted within the left circumflex coronary artery.  Mild changes of interstitial emphysema.  Scar is noted within the right upper lobe.  There is no airspace consolidation identified.  Review of the visualized osseous structures is significant for thoracic degenerative disc disease.  Limited imaging through the upper abdomen shows changes of prior cholecystectomy and gastric bypass surgery.  The adrenal glands are visualized and appears normal.  IMPRESSION:  1.  Stable aneurysmal dilatation of  the ascending thoracic aorta. 2.  Increased caliber of the pulmonary arteries suggesting PA hypertension.   Original Report Authenticated By: Rosealee Albee, M.D.       Recent Lab Findings: Lab Results  Component Value Date   WBC 10.8* 09/10/2011   HGB 13.6 09/10/2011   HCT 40.0 09/10/2011   PLT 156 09/10/2011   GLUCOSE 156* 09/10/2011   ALT 20 05/20/2010   AST 20 05/20/2010   NA 136 09/10/2011   K 3.3* 09/10/2011   CL 100 09/10/2011   CREATININE 1.78* 09/11/2011   BUN 25* 09/11/2011   CO2 23 09/10/2011   TSH 1.731 06/09/2010   INR 1.51* 09/10/2011   HGBA1C  Value: 6.7 (NOTE)   The ADA recommends the following therapeutic goals for glycemic   control related to Hgb A1C measurement:   Goal of Therapy:   < 7.0% Hgb A1C   Action Suggested:  > 8.0% Hgb A1C   Ref:  Diabetes Care, 22, Suppl. 1, 1999* 08/19/2006      Assessment / Plan:      Patient with known dilated ascending aorta without evidence of aortic insufficiency. A CT scan today is a sitting aorta appears unchanged from last year at 4.7 cm. He does have some mild dilatation of this pulmonary artery but there is no evidence of pulmonary emboli.  With his age and comorbidities I would not recommend replacement of his the ascending aorta electively until it reached 5.5 cm.  I'll plan to see the patient back in one year with a followup CT scan of the chest.      Delight Ovens MD  Beeper [phone] Office [phone] 09/14/2011 1:37 PM

## 2011-09-18 DIAGNOSIS — N400 Enlarged prostate without lower urinary tract symptoms: Secondary | ICD-10-CM | POA: Diagnosis not present

## 2011-09-18 DIAGNOSIS — N320 Bladder-neck obstruction: Secondary | ICD-10-CM | POA: Diagnosis not present

## 2011-09-18 DIAGNOSIS — R972 Elevated prostate specific antigen [PSA]: Secondary | ICD-10-CM | POA: Diagnosis not present

## 2011-09-19 ENCOUNTER — Ambulatory Visit (INDEPENDENT_AMBULATORY_CARE_PROVIDER_SITE_OTHER): Payer: 59

## 2011-09-19 DIAGNOSIS — I2699 Other pulmonary embolism without acute cor pulmonale: Secondary | ICD-10-CM | POA: Diagnosis not present

## 2011-09-19 DIAGNOSIS — Z7901 Long term (current) use of anticoagulants: Secondary | ICD-10-CM

## 2011-09-19 LAB — POCT INR: INR: 3.7

## 2011-10-03 ENCOUNTER — Ambulatory Visit (INDEPENDENT_AMBULATORY_CARE_PROVIDER_SITE_OTHER): Payer: 59

## 2011-10-03 DIAGNOSIS — I2699 Other pulmonary embolism without acute cor pulmonale: Secondary | ICD-10-CM | POA: Diagnosis not present

## 2011-10-03 DIAGNOSIS — Z7901 Long term (current) use of anticoagulants: Secondary | ICD-10-CM

## 2011-10-03 LAB — POCT INR: INR: 3

## 2011-10-13 DIAGNOSIS — H2510 Age-related nuclear cataract, unspecified eye: Secondary | ICD-10-CM | POA: Diagnosis not present

## 2011-10-13 DIAGNOSIS — H18419 Arcus senilis, unspecified eye: Secondary | ICD-10-CM | POA: Diagnosis not present

## 2011-10-19 ENCOUNTER — Telehealth: Payer: Self-pay

## 2011-10-19 MED ORDER — PANTOPRAZOLE SODIUM 40 MG PO TBEC: 40.0000 mg | DELAYED_RELEASE_TABLET | Freq: Every day | ORAL | Status: DC

## 2011-10-19 NOTE — Telephone Encounter (Signed)
Patient called was told received a fax refill request from [facility] for pantoprazole 40 mg daily.Refill sent to pharmacy.Also needs appointment .Appointment scheduled with Norma Fredrickson NP 11/05/11.

## 2011-10-24 ENCOUNTER — Ambulatory Visit (INDEPENDENT_AMBULATORY_CARE_PROVIDER_SITE_OTHER): Payer: 59

## 2011-10-24 DIAGNOSIS — I2699 Other pulmonary embolism without acute cor pulmonale: Secondary | ICD-10-CM

## 2011-10-24 DIAGNOSIS — I719 Aortic aneurysm of unspecified site, without rupture: Secondary | ICD-10-CM | POA: Diagnosis not present

## 2011-10-24 DIAGNOSIS — M545 Low back pain, unspecified: Secondary | ICD-10-CM | POA: Diagnosis not present

## 2011-10-24 DIAGNOSIS — I509 Heart failure, unspecified: Secondary | ICD-10-CM | POA: Diagnosis not present

## 2011-10-24 DIAGNOSIS — N400 Enlarged prostate without lower urinary tract symptoms: Secondary | ICD-10-CM | POA: Diagnosis not present

## 2011-10-24 DIAGNOSIS — Z7901 Long term (current) use of anticoagulants: Secondary | ICD-10-CM

## 2011-10-24 DIAGNOSIS — Z23 Encounter for immunization: Secondary | ICD-10-CM | POA: Diagnosis not present

## 2011-10-24 DIAGNOSIS — E559 Vitamin D deficiency, unspecified: Secondary | ICD-10-CM | POA: Diagnosis not present

## 2011-10-24 DIAGNOSIS — H9190 Unspecified hearing loss, unspecified ear: Secondary | ICD-10-CM | POA: Diagnosis not present

## 2011-10-24 DIAGNOSIS — J3489 Other specified disorders of nose and nasal sinuses: Secondary | ICD-10-CM | POA: Diagnosis not present

## 2011-10-24 DIAGNOSIS — T81718A Complication of other artery following a procedure, not elsewhere classified, initial encounter: Secondary | ICD-10-CM | POA: Diagnosis not present

## 2011-10-24 LAB — POCT INR: INR: 3.6

## 2011-10-25 ENCOUNTER — Ambulatory Visit (INDEPENDENT_AMBULATORY_CARE_PROVIDER_SITE_OTHER): Payer: 59 | Admitting: Surgery

## 2011-10-25 ENCOUNTER — Encounter (INDEPENDENT_AMBULATORY_CARE_PROVIDER_SITE_OTHER): Payer: Self-pay | Admitting: Surgery

## 2011-10-25 VITALS — BP 118/64 | HR 72 | Temp 97.6°F | Resp 16 | Ht 67.0 in | Wt 279.5 lb

## 2011-10-25 DIAGNOSIS — Z9884 Bariatric surgery status: Secondary | ICD-10-CM | POA: Insufficient documentation

## 2011-10-25 NOTE — Patient Instructions (Signed)
1. Stay on liquids for the next 2 days as you adapt to your new fill volume.  Then resume your previous diet. 2. Decreasing your carbohydrate intake will hasten you weight loss.  Rely more on proteins for your meals.  Avoid condiments that contain sweets such as Honey Mustard and sugary salad dressings.   3. Stay in the "green zone".  If you are regurgitating with meals, having night time reflux, and find yourself eating soft comfort foods (mashed potatoes, potato chips)...realize that you are developing "maladaptive eating".  You will not lose weight this way and may regain weight.  The GREEN ZONE is eating smaller portions and not regurgitating.  Hence we may need to withdraw fluid from your band. 4. Build exercise into your daily routine.  Walking is the best way to start but do something every day if you can.     

## 2011-10-25 NOTE — Progress Notes (Signed)
[name] Body mass index is 43.78 kg/(m^2).  Having regurgitation:  Not as much.  Recent UGI showed no hiatus hernia.  Reviewed  Nocturnal reflux?  no  Amount of fill  0.3  Seeing Ed [name] for thoracic aneurysm.  Has struggled with sustained weight loss.   Will recheck in 7-8 weeks.

## 2011-10-28 ENCOUNTER — Other Ambulatory Visit: Payer: Self-pay | Admitting: Cardiology

## 2011-11-06 ENCOUNTER — Ambulatory Visit (INDEPENDENT_AMBULATORY_CARE_PROVIDER_SITE_OTHER): Payer: 59 | Admitting: Pharmacist

## 2011-11-06 ENCOUNTER — Ambulatory Visit (INDEPENDENT_AMBULATORY_CARE_PROVIDER_SITE_OTHER): Payer: 59 | Admitting: Nurse Practitioner

## 2011-11-06 ENCOUNTER — Encounter: Payer: Self-pay | Admitting: Nurse Practitioner

## 2011-11-06 VITALS — BP 110/64 | HR 60 | Ht 67.0 in | Wt 279.4 lb

## 2011-11-06 DIAGNOSIS — Z7901 Long term (current) use of anticoagulants: Secondary | ICD-10-CM

## 2011-11-06 DIAGNOSIS — I259 Chronic ischemic heart disease, unspecified: Secondary | ICD-10-CM

## 2011-11-06 DIAGNOSIS — I10 Essential (primary) hypertension: Secondary | ICD-10-CM

## 2011-11-06 DIAGNOSIS — I2699 Other pulmonary embolism without acute cor pulmonale: Secondary | ICD-10-CM

## 2011-11-06 DIAGNOSIS — E785 Hyperlipidemia, unspecified: Secondary | ICD-10-CM | POA: Diagnosis not present

## 2011-11-06 DIAGNOSIS — R0989 Other specified symptoms and signs involving the circulatory and respiratory systems: Secondary | ICD-10-CM

## 2011-11-06 LAB — POCT INR: INR: 2

## 2011-11-06 NOTE — Patient Instructions (Addendum)
Let's check your labs with your next INR  Stay on your medicines  Stay active  We will see you back in 6 months  Call the [facility] office at [phone] if you have any questions, problems or concerns.

## 2011-11-06 NOTE — Progress Notes (Signed)
Carlos Dawson Date of Birth: April 29, 1940 Medical Record #[id number]  History of Present Illness: Carlos Dawson is seen back today for his 6 month check. He is seen for Dr. [name]. He has multiple medical issues including CAD, thoracic aneurysm, pulmonary emboli, morbid obesity with prior lap banding, HTN, choric edema and PAF. He he is on coumadin. He has not had great success with weight loss from his lap banding. His aneurysm is followed yearly by Dr. [name] and he was last seen in August of this year.   He comes in today. He is here alone. He is doing ok. Has had some foot surgery back in the summer which has limited his ability to exercise. Has had a recent fill for his lap band and is down 6 pounds. No chest pain. Still with some SOB if he climbs steps but I think this will be a chronic issue for him. He would like to get off some of his medicines but I do not think that is possible. Not lightheaded or dizzy. Some bruising with his coumadin but no bleeding.   Current Outpatient Prescriptions on File Prior to Visit  Medication Sig Dispense Refill  . albuterol (PROVENTIL HFA;VENTOLIN HFA) 108 (90 BASE) MCG/ACT inhaler Inhale 2 puffs into the lungs every 6 (six) hours as needed. For shortness of breath      . aspirin 81 MG tablet Take 81 mg by mouth daily.       Marland Kitchen atenolol (TENORMIN) 25 MG tablet Take 25 mg by mouth daily.        . beclomethasone (QVAR) 80 MCG/ACT inhaler Inhale 2 puffs into the lungs 2 (two) times daily.  1 Inhaler  2  . finasteride (PROSCAR) 5 MG tablet Take 5 mg by mouth daily.        . fluticasone (FLONASE) 50 MCG/ACT nasal spray Place 2 sprays into the nose as needed. For allergies      . furosemide (LASIX) 80 MG tablet Take 1 1/2 tablets two times a day  90 tablet  11  . nitroGLYCERIN (NITROSTAT) 0.4 MG SL tablet Place 0.4 mg under the tongue every 5 (five) minutes as needed. For chest pain      . pantoprazole (PROTONIX) 40 MG tablet Take 1 tablet (40 mg total) by mouth daily.   30 tablet  6  . potassium chloride SA (KLOR-CON M20) 20 MEQ tablet Take 2 tablets (40 mEq total) by mouth 2 (two) times daily.  120 tablet  5  . ranolazine (RANEXA) 500 MG 12 hr tablet Take 1 tablet (500 mg total) by mouth 2 (two) times daily.  60 tablet  5  . rosuvastatin (CRESTOR) 10 MG tablet Take 10 mg by mouth at bedtime.      Marland Kitchen spironolactone (ALDACTONE) 25 MG tablet Take 1 tablet (25 mg total) by mouth daily.  30 tablet  5  . Tamsulosin HCl (FLOMAX) 0.4 MG CAPS Take 0.4 mg by mouth daily.        . traMADol (ULTRAM) 50 MG tablet Take 50 mg by mouth every 6 (six) hours as needed.        . valsartan (DIOVAN) 160 MG tablet Take 160 mg by mouth daily.      Marland Kitchen VITAMIN D, CHOLECALCIFEROL, PO Take 5,000 mg/day by mouth daily.       Marland Kitchen warfarin (COUMADIN) 5 MG tablet Take 2.5-5 mg by mouth at bedtime. 5mg  every day except take 2.5mg  on Friday        Allergies  Allergen Reactions  . Morphine Itching  . Adhesive (Tape) Itching and Rash    Where applied.    Past Medical History  Diagnosis Date  . Hypertension   . Obesities, morbid   . Pulmonary embolism   . Anticoagulant long-term use   . CAD (coronary artery disease)     s/p BMS to 2nd OM in Sept 2012  . Thoracic aortic aneurysm     followed by Dr. [name]; last CT November 11, 2010 and unchanged  . OA (osteoarthritis)   . LVH (left ventricular hypertrophy)   . Colonic polyp   . OSA (obstructive sleep apnea)     PSG 03/30/97 AHI 21, BPAP 13/9  . Mild intermittent asthma   . Hemorrhoids   . Generalized headaches   . SOB (shortness of breath)     using oxygen with exercise  . Diastolic dysfunction   . ASCVD (arteriosclerotic cardiovascular disease)     Prior BMS to the 2nd OM in September 2012; with repeat cath in October showing patency  . Sleep apnea   . GERD (gastroesophageal reflux disease)   . IBS (irritable bowel syndrome)   . Aortic root enlargement   . Ascending aortic aneurysm     recent scan in October 2012 showing  no change; followed by Dr. [name]  . PAF (paroxysmal atrial fibrillation)     treated with Coumadin  . Hearing loss   . Contact lens/glasses fitting     Past Surgical History  Procedure Date  . Hemorroidectomy   . Vasectomy   . Cervical spine surgery 06/02/2010    lower back and neck  . Gastric bypass 05/27/2007  . Cholecystectomy   . Rotator cuff repair     both  . Achilles tendon repair   . Cardiac catheterization 2006  . Coronary stent placement Sept 2012    2nd OM with BMS  . Cardiac catheterization October 2012    Stent patent  . Appendectomy     History  Smoking status  . Former Smoker -- 1.5 packs/day for 30 years  . Quit date: 01/24/1992  Smokeless tobacco  . Never Used  Comment: Stopped in 1983    History  Alcohol Use No    Family History  Problem Relation Age of Onset  . Heart disease Mother   . Diabetes Mother   . Other Mother     stent placement  . Emphysema Father 25  . Heart attack Sister     Review of Systems: The review of systems is per the HPI. All other systems were reviewed and are negative.  Physical Exam: BP 110/64  Pulse 60  Ht 5\' 7"  (1.702 m)  Wt 279 lb 6.4 oz (126.735 kg)  BMI 43.76 kg/m2 Patient is very pleasant and in no acute distress. He is morbidly obese. Skin is warm and dry. Color is normal.  HEENT is unremarkable. Normocephalic/atraumatic. PERRL. Sclera are nonicteric. Neck is supple. No masses. No JVD. Lungs are clear. Cardiac exam shows a regular rate and rhythm. Abdomen is obese but soft. Extremities are full but without significant edema. Gait and ROM are intact. No gross neurologic deficits noted.   LABORATORY DATA:  Lab Results  Component Value Date   WBC 10.8* 09/10/2011   HGB 13.6 09/10/2011   HCT 40.0 09/10/2011   PLT 156 09/10/2011   GLUCOSE 156* 09/10/2011   ALT 20 05/20/2010   AST 20 05/20/2010   NA 136 09/10/2011   K 3.3* 09/10/2011   CL  100 09/10/2011   CREATININE 1.78* 09/11/2011   BUN 25* 09/11/2011   CO2  23 09/10/2011   TSH 1.731 06/09/2010   INR 2.0 11/06/2011   HGBA1C  Value: 6.7 (NOTE)   The ADA recommends the following therapeutic goals for glycemic   control related to Hgb A1C measurement:   Goal of Therapy:   < 7.0% Hgb A1C   Action Suggested:  > 8.0% Hgb A1C   Ref:  Diabetes Care, 22, Suppl. 1, 1999* 08/19/2006   No results found for this basename: CHOL,  HDL,  LDLCALC,  LDLDIRECT,  TRIG,  CHOLHDL    Assessment / Plan:  1. CAD - prior stent in 2012. No recurrent chest pain. Will continue to follow. Hopefully he can get into an exercise program.   2. Diastolic dysfunction -  Looks fairly compensated. No change in his therapy.   3. HTN -  Blood pressure looks good. No change in his medicines today.   4. Obesity -  Has had prior lap banding - he is down 6 pounds.   5. Thoracic aneurysm -followed by Dr. [name] annually.   6. PAF - on coumadin - in sinus on exam today.   7. HLD - needs lipids. He is not fasting today. Will check his labs with his next INR.   Patient is agreeable to this plan and will call if any problems develop in the interim.

## 2011-11-07 ENCOUNTER — Other Ambulatory Visit: Payer: Self-pay | Admitting: Cardiology

## 2011-11-14 ENCOUNTER — Encounter: Payer: Self-pay | Admitting: Cardiology

## 2011-11-14 DIAGNOSIS — L538 Other specified erythematous conditions: Secondary | ICD-10-CM | POA: Diagnosis not present

## 2011-11-14 DIAGNOSIS — D235 Other benign neoplasm of skin of trunk: Secondary | ICD-10-CM | POA: Diagnosis not present

## 2011-11-16 ENCOUNTER — Other Ambulatory Visit: Payer: Self-pay | Admitting: Cardiology

## 2011-11-20 ENCOUNTER — Ambulatory Visit (INDEPENDENT_AMBULATORY_CARE_PROVIDER_SITE_OTHER): Payer: 59

## 2011-11-20 ENCOUNTER — Other Ambulatory Visit (INDEPENDENT_AMBULATORY_CARE_PROVIDER_SITE_OTHER): Payer: 59

## 2011-11-20 DIAGNOSIS — E785 Hyperlipidemia, unspecified: Secondary | ICD-10-CM | POA: Diagnosis not present

## 2011-11-20 DIAGNOSIS — Z7901 Long term (current) use of anticoagulants: Secondary | ICD-10-CM

## 2011-11-20 DIAGNOSIS — I10 Essential (primary) hypertension: Secondary | ICD-10-CM | POA: Diagnosis not present

## 2011-11-20 DIAGNOSIS — I2699 Other pulmonary embolism without acute cor pulmonale: Secondary | ICD-10-CM | POA: Diagnosis not present

## 2011-11-20 LAB — CBC WITH DIFFERENTIAL/PLATELET
Basophils Absolute: 0.1 10*3/uL (ref 0.0–0.1)
Basophils Relative: 0.9 % (ref 0.0–3.0)
Eosinophils Absolute: 0.1 10*3/uL (ref 0.0–0.7)
Eosinophils Relative: 2.5 % (ref 0.0–5.0)
HCT: 39.9 % (ref 39.0–52.0)
Hemoglobin: 13 g/dL (ref 13.0–17.0)
Lymphocytes Relative: 23.1 % (ref 12.0–46.0)
Lymphs Abs: 1.3 10*3/uL (ref 0.7–4.0)
MCHC: 32.7 g/dL (ref 30.0–36.0)
MCV: 89.1 fl (ref 78.0–100.0)
Monocytes Absolute: 0.5 10*3/uL (ref 0.1–1.0)
Monocytes Relative: 9.2 % (ref 3.0–12.0)
Neutro Abs: 3.6 10*3/uL (ref 1.4–7.7)
Neutrophils Relative %: 64.3 % (ref 43.0–77.0)
Platelets: 148 10*3/uL — ABNORMAL LOW (ref 150.0–400.0)
RBC: 4.48 Mil/uL (ref 4.22–5.81)
RDW: 14.8 % — ABNORMAL HIGH (ref 11.5–14.6)
WBC: 5.7 10*3/uL (ref 4.5–10.5)

## 2011-11-20 LAB — BASIC METABOLIC PANEL
BUN: 18 mg/dL (ref 6–23)
CO2: 29 mEq/L (ref 19–32)
Calcium: 8.5 mg/dL (ref 8.4–10.5)
Chloride: 101 mEq/L (ref 96–112)
Creatinine, Ser: 1.1 mg/dL (ref 0.4–1.5)
GFR: 68.86 mL/min (ref >60.00)
Glucose, Bld: 116 mg/dL — ABNORMAL HIGH (ref 70–99)
Potassium: 3.9 mEq/L (ref 3.5–5.1)
Sodium: 137 mEq/L (ref 135–145)

## 2011-11-20 LAB — LIPID PANEL
Cholesterol: 124 mg/dL (ref 0–200)
HDL: 41.1 mg/dL (ref >39.00)
LDL Cholesterol: 62 mg/dL (ref 0–99)
Total CHOL/HDL Ratio: 3
Triglycerides: 104 mg/dL (ref 0.0–149.0)
VLDL: 20.8 mg/dL (ref 0.0–40.0)

## 2011-11-20 LAB — HEPATIC FUNCTION PANEL
ALT: 11 U/L (ref 0–53)
AST: 19 U/L (ref 0–37)
Albumin: 3.5 g/dL (ref 3.5–5.2)
Alkaline Phosphatase: 71 U/L (ref 39–117)
Bilirubin, Direct: 0.1 mg/dL (ref 0.0–0.3)
Total Bilirubin: 0.9 mg/dL (ref 0.3–1.2)
Total Protein: 6.7 g/dL (ref 6.0–8.3)

## 2011-11-20 LAB — POCT INR: INR: 1.9

## 2011-11-22 ENCOUNTER — Encounter: Payer: Self-pay | Admitting: Pulmonary Disease

## 2011-11-22 ENCOUNTER — Ambulatory Visit (INDEPENDENT_AMBULATORY_CARE_PROVIDER_SITE_OTHER): Payer: 59 | Admitting: Pulmonary Disease

## 2011-11-22 VITALS — BP 90/60 | HR 58 | Temp 97.9°F | Ht 67.0 in | Wt 281.4 lb

## 2011-11-22 DIAGNOSIS — J4520 Mild intermittent asthma, uncomplicated: Secondary | ICD-10-CM

## 2011-11-22 DIAGNOSIS — G4733 Obstructive sleep apnea (adult) (pediatric): Secondary | ICD-10-CM

## 2011-11-22 DIAGNOSIS — Z9884 Bariatric surgery status: Secondary | ICD-10-CM

## 2011-11-22 DIAGNOSIS — R0902 Hypoxemia: Secondary | ICD-10-CM | POA: Diagnosis not present

## 2011-11-22 DIAGNOSIS — R0600 Dyspnea, unspecified: Secondary | ICD-10-CM

## 2011-11-22 DIAGNOSIS — J45909 Unspecified asthma, uncomplicated: Secondary | ICD-10-CM

## 2011-11-22 DIAGNOSIS — R0989 Other specified symptoms and signs involving the circulatory and respiratory systems: Secondary | ICD-10-CM

## 2011-11-22 DIAGNOSIS — R0609 Other forms of dyspnea: Secondary | ICD-10-CM

## 2011-11-22 DIAGNOSIS — J4530 Mild persistent asthma, uncomplicated: Secondary | ICD-10-CM

## 2011-11-22 MED ORDER — BECLOMETHASONE DIPROPIONATE 80 MCG/ACT IN AERS: 2.0000 | INHALATION_SPRAY | Freq: Two times a day (BID) | RESPIRATORY_TRACT | Status: DC

## 2011-11-22 MED ORDER — MONTELUKAST SODIUM 10 MG PO TABS: 10.0000 mg | ORAL_TABLET | Freq: Every day | ORAL | Status: DC

## 2011-11-22 NOTE — Assessment & Plan Note (Signed)
He has frequent use of albuterol.  He has increased sinus symptoms with post-nasal drip and sneezing.  Will continue Qvar, and add singulair.  He is to continue albuterol as needed.

## 2011-11-22 NOTE — Patient Instructions (Signed)
Singulair 10 mg pill>>take 1 pill nightly Will get copy of BPAP report and call with results Follow up in 6 months

## 2011-11-22 NOTE — Progress Notes (Signed)
Chief Complaint  Patient presents with  . Follow-up    breathing has been good so far, wheezing in the mornings. no cough, chest tx  . Sleep Apnea    c/o waking up with dry mouth/terrible taste in his mouth after using bpap machine x 3 months--adjusting humidity. wears bpap 5/7 nights. denies any problems with mask    History of Present Illness: Carlos Dawson is a 71 y.o. male former smoker with dyspnea, OSA on BPAP 13/9, and asthma.  He has been getting occasional cough and wheeze.  He is using his albuterol 6 hours per week.  He has noticed more sinus congestion and sneezing.  He is getting dry mouth from his BiPAP machine.    He has been getting a bitter taste in his mouth more often.  He has been clearing his throat frequently.   Tests: PSG 03/30/97 >> AHI 21 BPAP 11/16/09 to 12/01/09 >> Used on 11 of 16 nights. Average 4hrs 28 min per night. Optimal pressure 13/9 with average AHI 6. Minimal airleak ONO with BPAP and room air 12/12/10 >> Test time 6 hrs 56 min.  Basal SpO2 93.1%, low SpO2 86%.  Spent 2.8 min with SpO2 < 88%. PFT 12/20/10 >> FEV1 2.13(79%), FEV1% 71, TLC 5.80(100%), DLCO 57%, no BD CT chest 09/14/11 >> Stable dilatation of ascending thoracic aorta 4.7 cm, PA 4.1 cm   Past Medical History  Diagnosis Date  . Hypertension   . Obesities, morbid   . Pulmonary embolism   . Anticoagulant long-term use   . CAD (coronary artery disease)     s/p BMS to 2nd OM in Sept 2012  . Thoracic aortic aneurysm     followed by Dr. [name]; last CT November 11, 2010 and unchanged  . OA (osteoarthritis)   . LVH (left ventricular hypertrophy)   . Colonic polyp   . OSA (obstructive sleep apnea)     PSG 03/30/97 AHI 21, BPAP 13/9  . Mild intermittent asthma   . Hemorrhoids   . Generalized headaches   . SOB (shortness of breath)     using oxygen with exercise  . Diastolic dysfunction   . ASCVD (arteriosclerotic cardiovascular disease)     Prior BMS to the 2nd OM in September  2012; with repeat cath in October showing patency  . Sleep apnea   . GERD (gastroesophageal reflux disease)   . IBS (irritable bowel syndrome)   . Aortic root enlargement   . Ascending aortic aneurysm     recent scan in October 2012 showing no change; followed by Dr. [name]  . PAF (paroxysmal atrial fibrillation)     treated with Coumadin  . Hearing loss   . Contact lens/glasses fitting     Past Surgical History  Procedure Date  . Hemorroidectomy   . Vasectomy   . Cervical spine surgery 06/02/2010    lower back and neck  . Gastric bypass 05/27/2007  . Cholecystectomy   . Rotator cuff repair     both  . Achilles tendon repair   . Cardiac catheterization 2006  . Coronary stent placement Sept 2012    2nd OM with BMS  . Cardiac catheterization October 2012    Stent patent  . Appendectomy     Current Outpatient Prescriptions on File Prior to Visit  Medication Sig Dispense Refill  . albuterol (PROVENTIL HFA;VENTOLIN HFA) 108 (90 BASE) MCG/ACT inhaler Inhale 2 puffs into the lungs every 6 (six) hours as needed. For shortness of breath      .  aspirin 81 MG tablet Take 81 mg by mouth daily.       Marland Kitchen atenolol (TENORMIN) 25 MG tablet Take 25 mg by mouth daily.        . beclomethasone (QVAR) 80 MCG/ACT inhaler Inhale 2 puffs into the lungs 2 (two) times daily.  1 Inhaler  2  . finasteride (PROSCAR) 5 MG tablet Take 5 mg by mouth daily.        . fluticasone (FLONASE) 50 MCG/ACT nasal spray Place 2 sprays into the nose as needed. For allergies      . furosemide (LASIX) 80 MG tablet Take 1 1/2 tablets two times a day  90 tablet  11  . NITROSTAT 0.4 MG SL tablet TAKE 1 TABLET UNDER THE TONGUE EVERY 5 MINUTES AS NEEDED UP TO 3 DOSES  25 tablet  1  . pantoprazole (PROTONIX) 40 MG tablet Take 1 tablet (40 mg total) by mouth daily.  30 tablet  6  . potassium chloride SA (KLOR-CON M20) 20 MEQ tablet Take 2 tablets (40 mEq total) by mouth 2 (two) times daily.  120 tablet  5  . ranolazine (RANEXA)  500 MG 12 hr tablet Take 1 tablet (500 mg total) by mouth 2 (two) times daily.  60 tablet  5  . rosuvastatin (CRESTOR) 10 MG tablet Take 10 mg by mouth at bedtime.      Marland Kitchen spironolactone (ALDACTONE) 25 MG tablet TAKE 1 TABLET BY MOUTH EVERY DAY  30 tablet  5  . Tamsulosin HCl (FLOMAX) 0.4 MG CAPS Take 0.4 mg by mouth daily.        . traMADol (ULTRAM) 50 MG tablet Take 50 mg by mouth every 6 (six) hours as needed.        . valsartan (DIOVAN) 160 MG tablet Take 160 mg by mouth daily.      Marland Kitchen VITAMIN D, CHOLECALCIFEROL, PO Take 5,000 mg/day by mouth daily.       Marland Kitchen warfarin (COUMADIN) 5 MG tablet Take 2.5-5 mg by mouth at bedtime. 5mg  every day except take 2.5mg  on Friday        Allergies  Allergen Reactions  . Morphine Itching  . Adhesive (Tape) Itching and Rash    Where applied.   History   Social History  . Marital Status: Married    Spouse Name: N/A    Number of Children: 3  . Years of Education: N/A   Occupational History  . Retired from [employer]    Social History Main Topics  . Smoking status: Former Smoker -- 1.5 packs/day for 30 years    Quit date: 01/24/1992  . Smokeless tobacco: Never Used   Comment: Stopped in 1983  . Alcohol Use: No  . Drug Use: No  . Sexually Active: Yes   Other Topics Concern  . Not on file   Social History Narrative  . No narrative on file   Family History  Problem Relation Age of Onset  . Heart disease Mother   . Diabetes Mother   . Other Mother     stent placement  . Emphysema Father 60  . Heart attack Sister      Physical Exam: Filed Vitals:   11/22/11 1621  BP: 90/60  Pulse: 58  Temp: 97.9 F (36.6 C)  TempSrc: Oral  Height: 5\' 7"  (1.702 m)  Weight: 281 lb 6.4 oz (127.642 kg)  SpO2: 90%    Wt Readings from Last 3 Encounters:  11/22/11 281 lb 6.4 oz (127.642 kg)  11/06/11 279 lb 6.4 oz (126.735 kg)  10/25/11 279 lb 8 oz (126.78 kg)    Body mass index is 44.07 kg/(m^2).   General - Obese  HEENT - No sinus tenderness,  no oral exudate, no LAN  Cardiac - s1s2 regular, no murmur  Chest - normal respiratory excursion, no wheeze/rales/dullness  Abdomen - obese, soft  Extremities - +1 ankle edema  Skin - no rashes  Neurologic - normal strength  Psychiatric - normal mood, behavior   Assessment/Plan:  Coralyn Helling, MD [facility] Pulmonary/Critical Care 11/22/2011, 5:10 PM Pager:  [phone] After 3pm call: [phone]

## 2011-11-22 NOTE — Assessment & Plan Note (Signed)
He reports increased reflux causing bitter taste.  Advised him to discuss with Dr. Daphine Deutscher and Dr. Kevan Ny.

## 2011-11-22 NOTE — Assessment & Plan Note (Signed)
He has increased mouth dryness.  Will get copy of his BiPAP download, and then determine if he needs change to his set up.

## 2011-11-22 NOTE — Assessment & Plan Note (Signed)
He is to continue 2 liters oxygen with exertion.  Will re-assess oxygen needs at next visit.   

## 2011-11-25 ENCOUNTER — Other Ambulatory Visit: Payer: Self-pay | Admitting: Cardiology

## 2011-12-04 DIAGNOSIS — H269 Unspecified cataract: Secondary | ICD-10-CM | POA: Diagnosis not present

## 2011-12-04 DIAGNOSIS — H2510 Age-related nuclear cataract, unspecified eye: Secondary | ICD-10-CM | POA: Diagnosis not present

## 2011-12-04 DIAGNOSIS — J352 Hypertrophy of adenoids: Secondary | ICD-10-CM | POA: Diagnosis not present

## 2011-12-05 DIAGNOSIS — H2510 Age-related nuclear cataract, unspecified eye: Secondary | ICD-10-CM | POA: Diagnosis not present

## 2011-12-06 ENCOUNTER — Telehealth: Payer: Self-pay | Admitting: Pulmonary Disease

## 2011-12-06 DIAGNOSIS — G4733 Obstructive sleep apnea (adult) (pediatric): Secondary | ICD-10-CM

## 2011-12-06 DIAGNOSIS — H269 Unspecified cataract: Secondary | ICD-10-CM | POA: Diagnosis not present

## 2011-12-06 NOTE — Telephone Encounter (Signed)
BPAP 09/30/11 to 11/28/11>>Used on 29 of 60 nights with average 3 hrs 21 min.  Average AHI 3 with BiPAP 13/9 cm H2O.  Discussed with pt. Will decrease BiPAP settings to 12/8 cm H2O.  Advised he should call back if he continues to have trouble with BiPAP.

## 2011-12-11 ENCOUNTER — Ambulatory Visit (INDEPENDENT_AMBULATORY_CARE_PROVIDER_SITE_OTHER): Payer: 59 | Admitting: *Deleted

## 2011-12-11 DIAGNOSIS — I2699 Other pulmonary embolism without acute cor pulmonale: Secondary | ICD-10-CM

## 2011-12-11 DIAGNOSIS — Z7901 Long term (current) use of anticoagulants: Secondary | ICD-10-CM | POA: Diagnosis not present

## 2011-12-11 LAB — POCT INR: INR: 2.8

## 2011-12-13 ENCOUNTER — Encounter (INDEPENDENT_AMBULATORY_CARE_PROVIDER_SITE_OTHER): Payer: 59 | Admitting: Surgery

## 2011-12-15 ENCOUNTER — Other Ambulatory Visit: Payer: Self-pay

## 2011-12-15 MED ORDER — ROSUVASTATIN CALCIUM 10 MG PO TABS: 10.0000 mg | ORAL_TABLET | Freq: Every day | ORAL | Status: DC

## 2011-12-18 ENCOUNTER — Other Ambulatory Visit: Payer: Self-pay

## 2011-12-25 DIAGNOSIS — H269 Unspecified cataract: Secondary | ICD-10-CM | POA: Diagnosis not present

## 2011-12-25 DIAGNOSIS — H2510 Age-related nuclear cataract, unspecified eye: Secondary | ICD-10-CM | POA: Diagnosis not present

## 2011-12-27 ENCOUNTER — Other Ambulatory Visit: Payer: Self-pay | Admitting: Nurse Practitioner

## 2011-12-28 ENCOUNTER — Other Ambulatory Visit: Payer: Self-pay

## 2011-12-28 MED ORDER — WARFARIN SODIUM 5 MG PO TABS: ORAL_TABLET | ORAL | Status: DC

## 2011-12-29 DIAGNOSIS — M79609 Pain in unspecified limb: Secondary | ICD-10-CM | POA: Diagnosis not present

## 2011-12-29 DIAGNOSIS — R609 Edema, unspecified: Secondary | ICD-10-CM | POA: Diagnosis not present

## 2011-12-29 DIAGNOSIS — M7989 Other specified soft tissue disorders: Secondary | ICD-10-CM | POA: Diagnosis not present

## 2012-01-08 ENCOUNTER — Ambulatory Visit (INDEPENDENT_AMBULATORY_CARE_PROVIDER_SITE_OTHER): Payer: 59 | Admitting: Pharmacist

## 2012-01-08 DIAGNOSIS — I2699 Other pulmonary embolism without acute cor pulmonale: Secondary | ICD-10-CM | POA: Diagnosis not present

## 2012-01-08 DIAGNOSIS — Z7901 Long term (current) use of anticoagulants: Secondary | ICD-10-CM | POA: Diagnosis not present

## 2012-01-08 LAB — POCT INR: INR: 1.4

## 2012-01-30 ENCOUNTER — Ambulatory Visit (INDEPENDENT_AMBULATORY_CARE_PROVIDER_SITE_OTHER): Payer: 59 | Admitting: *Deleted

## 2012-01-30 DIAGNOSIS — I2699 Other pulmonary embolism without acute cor pulmonale: Secondary | ICD-10-CM

## 2012-01-30 DIAGNOSIS — Z7901 Long term (current) use of anticoagulants: Secondary | ICD-10-CM

## 2012-01-30 LAB — POCT INR: INR: 2.9

## 2012-02-01 ENCOUNTER — Ambulatory Visit (INDEPENDENT_AMBULATORY_CARE_PROVIDER_SITE_OTHER): Payer: 59 | Admitting: Surgery

## 2012-02-01 ENCOUNTER — Encounter (INDEPENDENT_AMBULATORY_CARE_PROVIDER_SITE_OTHER): Payer: Self-pay | Admitting: Surgery

## 2012-02-01 VITALS — BP 118/50 | HR 60 | Temp 97.5°F | Resp 16 | Ht 67.0 in | Wt 280.0 lb

## 2012-02-01 DIAGNOSIS — K602 Anal fissure, unspecified: Secondary | ICD-10-CM

## 2012-02-01 MED ORDER — AMBULATORY NON FORMULARY MEDICATION: 1.0000 "application " | Freq: Four times a day (QID) | Status: DC

## 2012-02-01 NOTE — Progress Notes (Signed)
[name] Body mass index is 43.85 kg/(m^2).  Having regurgitation:  Yes particularly with taking medications in night  Nocturnal reflux?  yes  Amount of fill  -0.2 cc  Carlos Dawson also complains of an anal fissure. His bleeding is magnified by the Coumadin that he takes. We discussed this and it is bright red bleeding with painful large bowel movements.  A prescription for diltiazem rectal cream was provided. I will see him again in 3 months.

## 2012-02-01 NOTE — Patient Instructions (Signed)
Anal Fissure, Adult An anal fissure is a small tear or crack in the skin around the anus. Bleeding from a fissure usually stops on its own within a few minutes. However, bleeding will often reoccur with each bowel movement until the crack heals.  CAUSES   Passing large, hard stools.  Frequent diarrheal stools.  Constipation.  Inflammatory bowel disease (Crohn's disease or ulcerative colitis).  Infections.  Anal sex. SYMPTOMS   Small amounts of blood seen on your stools, on toilet paper, or in the toilet after a bowel movement.  Rectal bleeding.  Painful bowel movements.  Itching or irritation around the anus. DIAGNOSIS Your caregiver will examine the anal area. An anal fissure can usually be seen with careful inspection. A rectal exam may be performed and a short tube (anoscope) may be used to examine the anal canal. TREATMENT   You may be instructed to take fiber supplements. These supplements can soften your stool to help make bowel movements easier.  Sitz baths may be recommended to help heal the tear. Do not use soap in the sitz baths.  Use the Diltiazem 2% cream and apply to anus 4 x a day to heal fissure  A medicated cream or ointment may be prescribed to lessen discomfort. HOME CARE INSTRUCTIONS   Maintain a diet high in fruits, whole grains, and vegetables. Avoid constipating foods like bananas and dairy products.  Take sitz baths as directed by your caregiver.  Drink enough fluids to keep your urine clear or pale yellow.  Only take over-the-counter or prescription medicines for pain, discomfort, or fever as directed by your caregiver. Do not take aspirin as this may increase bleeding.  Do not use ointments containing numbing medications (anesthetics) or hydrocortisone. They could slow healing. SEEK MEDICAL CARE IF:   Your fissure is not completely healed within 3 days.  You have further bleeding.  You have a fever.  You have diarrhea mixed with  blood.  You have pain.  Your problem is getting worse rather than better. MAKE SURE YOU:   Understand these instructions.  Will watch your condition.  Will get help right away if you are not doing well or get worse. Document Released: 01/09/2005 Document Revised: 04/03/2011 Document Reviewed: 06/26/2010 [facility] Patient Information 2013 [place], [place].

## 2012-02-05 ENCOUNTER — Telehealth: Payer: Self-pay | Admitting: Cardiology

## 2012-02-05 NOTE — Telephone Encounter (Signed)
New Problem:    Patient called in needing a 90 day refill of his atenolol (TENORMIN) 25 MG tablet called into the [facility] [address] phone- [phone].

## 2012-02-06 DIAGNOSIS — L538 Other specified erythematous conditions: Secondary | ICD-10-CM | POA: Diagnosis not present

## 2012-02-07 ENCOUNTER — Other Ambulatory Visit: Payer: Self-pay | Admitting: *Deleted

## 2012-02-07 MED ORDER — ATENOLOL 25 MG PO TABS: 25.0000 mg | ORAL_TABLET | Freq: Every day | ORAL | Status: DC

## 2012-02-07 NOTE — Telephone Encounter (Signed)
F/u    Atenolol  25 mg .    [facility] [address] [phone].

## 2012-02-08 ENCOUNTER — Telehealth: Payer: Self-pay

## 2012-02-08 ENCOUNTER — Other Ambulatory Visit: Payer: Self-pay

## 2012-02-08 MED ORDER — VALSARTAN 160 MG PO TABS: 160.0000 mg | ORAL_TABLET | Freq: Every day | ORAL | Status: DC

## 2012-02-08 MED ORDER — TAMSULOSIN HCL 0.4 MG PO CAPS: 0.4000 mg | ORAL_CAPSULE | Freq: Every day | ORAL | Status: DC

## 2012-02-08 MED ORDER — PANTOPRAZOLE SODIUM 40 MG PO TBEC: 40.0000 mg | DELAYED_RELEASE_TABLET | Freq: Every day | ORAL | Status: DC

## 2012-02-08 MED ORDER — POTASSIUM CHLORIDE CRYS ER 20 MEQ PO TBCR: 40.0000 meq | EXTENDED_RELEASE_TABLET | Freq: Two times a day (BID) | ORAL | Status: DC

## 2012-02-08 MED ORDER — ROSUVASTATIN CALCIUM 10 MG PO TABS: 10.0000 mg | ORAL_TABLET | Freq: Every day | ORAL | Status: DC

## 2012-02-08 MED ORDER — ATENOLOL 25 MG PO TABS: 25.0000 mg | ORAL_TABLET | Freq: Every day | ORAL | Status: DC

## 2012-02-08 MED ORDER — FINASTERIDE 5 MG PO TABS: 5.0000 mg | ORAL_TABLET | Freq: Every day | ORAL | Status: DC

## 2012-02-08 MED ORDER — SPIRONOLACTONE 25 MG PO TABS: 25.0000 mg | ORAL_TABLET | Freq: Every day | ORAL | Status: DC

## 2012-02-08 NOTE — Telephone Encounter (Signed)
Called and spoke to patient about atenolol (TENORMIN) 25 MG tablet refill sent to [facility] [address]. Pt stated he did pick up prescription yesterday 02/07/12 but had a question about his other prescriptions. Pt wants his medication to be filled at the [place] because he is losing his health insurance soon and wants his medications written out and signed by Dr. [name]. Told pt Dr. [name] will be here Wednesday 02/14/12 and if he has not heard from [place] by Wednesday afternoon to give [place] a call back.

## 2012-02-09 NOTE — Telephone Encounter (Signed)
Told pt Dr. [name] will be here next week

## 2012-02-14 MED ORDER — POTASSIUM CHLORIDE CRYS ER 20 MEQ PO TBCR: 40.0000 meq | EXTENDED_RELEASE_TABLET | Freq: Two times a day (BID) | ORAL | Status: DC

## 2012-02-14 MED ORDER — FINASTERIDE 5 MG PO TABS: 5.0000 mg | ORAL_TABLET | Freq: Every day | ORAL | Status: DC

## 2012-02-14 MED ORDER — PANTOPRAZOLE SODIUM 40 MG PO TBEC: 40.0000 mg | DELAYED_RELEASE_TABLET | Freq: Every day | ORAL | Status: DC

## 2012-02-14 MED ORDER — SPIRONOLACTONE 25 MG PO TABS: 25.0000 mg | ORAL_TABLET | Freq: Every day | ORAL | Status: DC

## 2012-02-14 MED ORDER — TAMSULOSIN HCL 0.4 MG PO CAPS: 0.4000 mg | ORAL_CAPSULE | Freq: Every day | ORAL | Status: DC

## 2012-02-14 MED ORDER — FUROSEMIDE 80 MG PO TABS: ORAL_TABLET | ORAL | Status: DC

## 2012-02-14 MED ORDER — ROSUVASTATIN CALCIUM 10 MG PO TABS: 10.0000 mg | ORAL_TABLET | Freq: Every day | ORAL | Status: DC

## 2012-02-14 MED ORDER — ATENOLOL 25 MG PO TABS: 25.0000 mg | ORAL_TABLET | Freq: Every day | ORAL | Status: DC

## 2012-02-14 MED ORDER — VALSARTAN 160 MG PO TABS: 160.0000 mg | ORAL_TABLET | Freq: Every day | ORAL | Status: DC

## 2012-02-14 NOTE — Addendum Note (Signed)
Addended by: Meda Klinefelter D on: 02/14/2012 12:53 PM   Modules accepted: Orders

## 2012-02-15 ENCOUNTER — Telehealth: Payer: Self-pay

## 2012-02-15 NOTE — Telephone Encounter (Signed)
90 day written prescriptions for atenolol,proscar,lasix,protonix,klor con,crestor,aldactone,flomax,diovan signed by Dr.Jordan, mailed to patient.

## 2012-02-20 ENCOUNTER — Ambulatory Visit (INDEPENDENT_AMBULATORY_CARE_PROVIDER_SITE_OTHER): Payer: PRIVATE HEALTH INSURANCE | Admitting: *Deleted

## 2012-02-20 DIAGNOSIS — I2699 Other pulmonary embolism without acute cor pulmonale: Secondary | ICD-10-CM

## 2012-02-20 DIAGNOSIS — Z7901 Long term (current) use of anticoagulants: Secondary | ICD-10-CM

## 2012-02-20 LAB — POCT INR: INR: 2.1

## 2012-03-09 ENCOUNTER — Encounter (HOSPITAL_BASED_OUTPATIENT_CLINIC_OR_DEPARTMENT_OTHER): Payer: Self-pay

## 2012-03-09 ENCOUNTER — Emergency Department (HOSPITAL_BASED_OUTPATIENT_CLINIC_OR_DEPARTMENT_OTHER)
Admission: EM | Admit: 2012-03-09 | Discharge: 2012-03-09 | Disposition: A | Discharge status: Home / Self Care | Payer: 59 | Attending: Emergency Medicine | Admitting: Emergency Medicine

## 2012-03-09 DIAGNOSIS — Z951 Presence of aortocoronary bypass graft: Secondary | ICD-10-CM | POA: Insufficient documentation

## 2012-03-09 DIAGNOSIS — Z860100 Personal history of colon polyps, unspecified: Secondary | ICD-10-CM | POA: Insufficient documentation

## 2012-03-09 DIAGNOSIS — J45901 Unspecified asthma with (acute) exacerbation: Secondary | ICD-10-CM | POA: Insufficient documentation

## 2012-03-09 DIAGNOSIS — M545 Low back pain, unspecified: Secondary | ICD-10-CM | POA: Diagnosis not present

## 2012-03-09 DIAGNOSIS — I2510 Atherosclerotic heart disease of native coronary artery without angina pectoris: Secondary | ICD-10-CM | POA: Insufficient documentation

## 2012-03-09 DIAGNOSIS — Z7982 Long term (current) use of aspirin: Secondary | ICD-10-CM | POA: Insufficient documentation

## 2012-03-09 DIAGNOSIS — Z8679 Personal history of other diseases of the circulatory system: Secondary | ICD-10-CM | POA: Insufficient documentation

## 2012-03-09 DIAGNOSIS — IMO0002 Reserved for concepts with insufficient information to code with codable children: Secondary | ICD-10-CM | POA: Insufficient documentation

## 2012-03-09 DIAGNOSIS — G4733 Obstructive sleep apnea (adult) (pediatric): Secondary | ICD-10-CM | POA: Insufficient documentation

## 2012-03-09 DIAGNOSIS — Z86711 Personal history of pulmonary embolism: Secondary | ICD-10-CM | POA: Insufficient documentation

## 2012-03-09 DIAGNOSIS — I10 Essential (primary) hypertension: Secondary | ICD-10-CM | POA: Insufficient documentation

## 2012-03-09 DIAGNOSIS — Z8719 Personal history of other diseases of the digestive system: Secondary | ICD-10-CM | POA: Insufficient documentation

## 2012-03-09 DIAGNOSIS — Z79899 Other long term (current) drug therapy: Secondary | ICD-10-CM | POA: Insufficient documentation

## 2012-03-09 DIAGNOSIS — R791 Abnormal coagulation profile: Secondary | ICD-10-CM

## 2012-03-09 DIAGNOSIS — K219 Gastro-esophageal reflux disease without esophagitis: Secondary | ICD-10-CM | POA: Insufficient documentation

## 2012-03-09 DIAGNOSIS — Z87891 Personal history of nicotine dependence: Secondary | ICD-10-CM | POA: Insufficient documentation

## 2012-03-09 DIAGNOSIS — Z7901 Long term (current) use of anticoagulants: Secondary | ICD-10-CM | POA: Insufficient documentation

## 2012-03-09 DIAGNOSIS — Z8739 Personal history of other diseases of the musculoskeletal system and connective tissue: Secondary | ICD-10-CM | POA: Insufficient documentation

## 2012-03-09 DIAGNOSIS — M549 Dorsalgia, unspecified: Secondary | ICD-10-CM

## 2012-03-09 LAB — CBC WITH DIFFERENTIAL/PLATELET
Basophils Absolute: 0 10*3/uL (ref 0.0–0.1)
Basophils Relative: 0 % (ref 0–1)
Eosinophils Absolute: 0.2 10*3/uL (ref 0.0–0.7)
Eosinophils Relative: 2 % (ref 0–5)
HCT: 40.1 % (ref 39.0–52.0)
Hemoglobin: 13.4 g/dL (ref 13.0–17.0)
Lymphocytes Relative: 19 % (ref 12–46)
Lymphs Abs: 1.7 10*3/uL (ref 0.7–4.0)
MCH: 29.6 pg (ref 26.0–34.0)
MCHC: 33.4 g/dL (ref 30.0–36.0)
MCV: 88.7 fL (ref 78.0–100.0)
Monocytes Absolute: 0.8 10*3/uL (ref 0.1–1.0)
Monocytes Relative: 9 % (ref 3–12)
Neutro Abs: 6.2 10*3/uL (ref 1.7–7.7)
Neutrophils Relative %: 70 % (ref 43–77)
Platelets: 146 10*3/uL — ABNORMAL LOW (ref 150–400)
RBC: 4.52 MIL/uL (ref 4.22–5.81)
RDW: 13.7 % (ref 11.5–15.5)
WBC: 8.9 10*3/uL (ref 4.0–10.5)

## 2012-03-09 LAB — BASIC METABOLIC PANEL
BUN: 19 mg/dL (ref 6–23)
CO2: 31 mEq/L (ref 19–32)
Calcium: 9.4 mg/dL (ref 8.4–10.5)
Chloride: 102 mEq/L (ref 96–112)
Creatinine, Ser: 1.3 mg/dL (ref 0.50–1.35)
GFR calc Af Amer: 63 mL/min — ABNORMAL LOW (ref >90)
GFR calc non Af Amer: 54 mL/min — ABNORMAL LOW (ref >90)
Glucose, Bld: 157 mg/dL — ABNORMAL HIGH (ref 70–99)
Potassium: 4.9 mEq/L (ref 3.5–5.1)
Sodium: 140 mEq/L (ref 135–145)

## 2012-03-09 LAB — PROTIME-INR
INR: 1.57 — ABNORMAL HIGH (ref 0.00–1.49)
Prothrombin Time: 18.3 seconds — ABNORMAL HIGH (ref 11.6–15.2)

## 2012-03-09 MED ORDER — HYDROMORPHONE HCL PF 2 MG/ML IJ SOLN
2.0000 mg | Freq: Once | INTRAMUSCULAR | Status: AC
  Administered 2012-03-09: 2 mg via INTRAMUSCULAR
  Filled 2012-03-09: qty 1

## 2012-03-09 MED ORDER — OXYCODONE-ACETAMINOPHEN 5-325 MG PO TABS: 1.0000 | ORAL_TABLET | Freq: Four times a day (QID) | ORAL | Status: DC | PRN

## 2012-03-09 NOTE — ED Provider Notes (Signed)
History     CSN: [id number]  Arrival date & time 03/09/12  1053   First MD Initiated Contact with Patient 03/09/12 1138      Chief Complaint  Patient presents with  . Back Pain    Patient is a 72 y.o. male presenting with back pain. The history is provided by the patient and the spouse.  Back Pain Location:  Lumbar spine Quality:  Aching Radiates to: bilateral thighs. Pain severity:  Severe Onset quality:  Gradual Timing:  Constant Progression:  Worsening Chronicity:  Recurrent Context: not falling   Relieved by:  Nothing Worsened by:  Ambulation Associated symptoms: no abdominal pain, no bladder incontinence, no chest pain, no dysuria, no fever, no leg pain, no numbness and no weakness   pt reports h/o back pain and previous surgery to low back He reports return for pain several weeks ago that is gradually worsening.  No falls.  No new weakness in his leg.  He does report pain in his thighs.  He can ambulate but it hurts.  No abd pain or fever.   He reports that he has no urinary incontinence.  He does report one episode of loose stool while urinating but reports he had no perianal numbness and he felt bowel movement but due to urinating he could not stop the BM Past Medical History  Diagnosis Date  . Hypertension   . Obesities, morbid   . Pulmonary embolism   . Anticoagulant long-term use   . CAD (coronary artery disease)     s/p BMS to 2nd OM in Sept 2012  . Thoracic aortic aneurysm     followed by Dr. [name]; last CT November 11, 2010 and unchanged  . OA (osteoarthritis)   . LVH (left ventricular hypertrophy)   . Colonic polyp   . OSA (obstructive sleep apnea)     PSG 03/30/97 AHI 21, BPAP 13/9  . Mild intermittent asthma   . Hemorrhoids   . Generalized headaches   . SOB (shortness of breath)     using oxygen with exercise  . Diastolic dysfunction   . ASCVD (arteriosclerotic cardiovascular disease)     Prior BMS to the 2nd OM in September 2012; with repeat  cath in October showing patency  . Sleep apnea   . GERD (gastroesophageal reflux disease)   . IBS (irritable bowel syndrome)   . Aortic root enlargement   . Ascending aortic aneurysm     recent scan in October 2012 showing no change; followed by Dr. [name]  . PAF (paroxysmal atrial fibrillation)     treated with Coumadin  . Hearing loss   . Contact lens/glasses fitting     Past Surgical History  Procedure Laterality Date  . Hemorroidectomy    . Vasectomy    . Cervical spine surgery  06/02/2010    lower back and neck  . Gastric bypass  05/27/2007  . Cholecystectomy    . Rotator cuff repair      both  . Achilles tendon repair    . Cardiac catheterization  2006  . Coronary stent placement  Sept 2012    2nd OM with BMS  . Cardiac catheterization  October 2012    Stent patent  . Appendectomy      Family History  Problem Relation Age of Onset  . Heart disease Mother   . Diabetes Mother   . Other Mother     stent placement  . Emphysema Father 77  . Heart attack Sister  History  Substance Use Topics  . Smoking status: Former Smoker -- 1.50 packs/day for 30 years    Quit date: 01/24/1992  . Smokeless tobacco: Never Used     Comment: Stopped in 1983  . Alcohol Use: No      Review of Systems  Constitutional: Negative for fever.  Respiratory: Negative for shortness of breath.   Cardiovascular: Negative for chest pain.  Gastrointestinal: Negative for abdominal pain.  Genitourinary: Negative for bladder incontinence, dysuria and difficulty urinating.  Musculoskeletal: Positive for back pain.  Neurological: Negative for weakness and numbness.  Psychiatric/Behavioral: Negative for agitation.  All other systems reviewed and are negative.    Allergies  Morphine and Adhesive  Home Medications   Current Outpatient Rx  Name  Route  Sig  Dispense  Refill  . albuterol (PROVENTIL HFA;VENTOLIN HFA) 108 (90 BASE) MCG/ACT inhaler   Inhalation   Inhale 2 puffs into  the lungs every 6 (six) hours as needed. For shortness of breath         . AMBULATORY NON FORMULARY MEDICATION   Rectal   Place 1 application rectally 4 (four) times daily. Diltiazem 2% compounded suspension.   1 Tube   2     Apply around anus for 3 weeks.  Call surgeon if no ...   . aspirin 81 MG tablet   Oral   Take 81 mg by mouth daily.          Marland Kitchen atenolol (TENORMIN) 25 MG tablet   Oral   Take 1 tablet (25 mg total) by mouth daily.   90 tablet   3   . beclomethasone (QVAR) 80 MCG/ACT inhaler   Inhalation   Inhale 2 puffs into the lungs 2 (two) times daily.   1 Inhaler   5   . finasteride (PROSCAR) 5 MG tablet   Oral   Take 1 tablet (5 mg total) by mouth daily.   90 tablet   3   . fluticasone (FLONASE) 50 MCG/ACT nasal spray   Nasal   Place 2 sprays into the nose as needed. For allergies         . furosemide (LASIX) 80 MG tablet      Take 1 1/2 tablets two times a day   90 tablet   3   . gabapentin (NEURONTIN) 100 MG capsule   Oral   Take 100 mg by mouth 3 (three) times daily. 200mg  for days 1 to 4.  400mg  for days 5 through 7. 600mg  day 7 onward.         . montelukast (SINGULAIR) 10 MG tablet   Oral   Take 1 tablet (10 mg total) by mouth at bedtime.   30 tablet   5   . pantoprazole (PROTONIX) 40 MG tablet   Oral   Take 1 tablet (40 mg total) by mouth daily.   90 tablet   3   . potassium chloride SA (KLOR-CON M20) 20 MEQ tablet   Oral   Take 2 tablets (40 mEq total) by mouth 2 (two) times daily.   360 tablet   3   . RANEXA 500 MG 12 hr tablet      TAKE 1 TABLET BY MOUTH TWICE A DAY   60 tablet   5   . rosuvastatin (CRESTOR) 10 MG tablet   Oral   Take 1 tablet (10 mg total) by mouth at bedtime.   90 tablet   3   . spironolactone (ALDACTONE) 25 MG tablet  Oral   Take 1 tablet (25 mg total) by mouth daily.   90 tablet   3     NEEDS REFILLS   . Tamsulosin HCl (FLOMAX) 0.4 MG CAPS   Oral   Take 1 capsule (0.4 mg total) by  mouth daily.   90 capsule   3   . traMADol (ULTRAM) 50 MG tablet   Oral   Take 50 mg by mouth every 6 (six) hours as needed.           . valsartan (DIOVAN) 160 MG tablet   Oral   Take 1 tablet (160 mg total) by mouth daily.   90 tablet   3   . VITAMIN D, CHOLECALCIFEROL, PO   Oral   Take 5,000 mg/day by mouth daily.          Marland Kitchen warfarin (COUMADIN) 5 MG tablet      Take as directed by anticoagulation clinic   60 tablet   2     60 day supply   . NITROSTAT 0.4 MG SL tablet      TAKE 1 TABLET UNDER THE TONGUE EVERY 5 MINUTES AS NEEDED UP TO 3 DOSES   25 tablet   1     BP 109/62  Pulse 64  Temp(Src) 97.9 F (36.6 C) (Oral)  Resp 18  Ht 5\' 7"  (1.702 m)  Wt 277 lb (125.646 kg)  BMI 43.37 kg/m2  SpO2 96%  Physical Exam CONSTITUTIONAL: Well developed/well nourished HEAD AND FACE: Normocephalic/atraumatic EYES: EOMI/PERRL ENMT: Mucous membranes moist NECK: supple no meningeal signs SPINE:lumbar tenderness.  Well healed scar noted.  No drainage/erythema noted, No bruising/crepitance/stepoffs noted to spine CV: S1/S2 noted, no murmurs/rubs/gallops noted LUNGS: Lungs are clear to auscultation bilaterally, no apparent distress ABDOMEN: soft, nontender, no rebound or guarding. obese GU:no cva tenderness Rectal - stool color normal, chaperone present NEURO: Awake/alert, no saddle anesthesia, rectal tone present (chaperone present), equal distal motor: hip flexion/knee flexion/extension, ankle dorsi/plantar flexion, great toe extension intact bilaterally, plantar reflex appropriate, no apparent sensory deficit in any dermatome.  Equal patellar/achilles reflex noted.  Pt is able to ambulate. EXTREMITIES: pulses normal, full ROM SKIN: warm, color normal PSYCH: no abnormalities of mood noted    ED Course  Procedures (including critical care time)  Labs Reviewed  CBC WITH DIFFERENTIAL - Abnormal; Notable for the following:    Platelets 146 (*)    All other  components within normal limits  BASIC METABOLIC PANEL  PROTIME-INR   78:29 PM Pt with multiple medical problems who presents with worsening back pain He reports it is similar to episodes of low back pain - he denies fever/chills/abd pain.  He reports that it is not improving unlike prior episodes.  Will check his INR, treat pain and reassess.  He has nsgy f/u later this month.  He reports minimal relief from home gabapentin   Pt improved after pain meds.  His INR was subtherapeutic, he will call his providers that manage his dosing.  He is in no distress.  He has no focal neuro deficits.  Reports similar type BP in the past.  Do not feel emergent imaging or further workup need.  We discussed strict return precautions.  He is aware of sedating nature of percocet and other meds.  He will f/u with nsgy later this month MDM  Nursing notes including past medical history and social history reviewed and considered in documentation Labs/vital reviewed and considered  Joya Gaskins, MD 03/09/12 [phone]

## 2012-03-09 NOTE — ED Notes (Signed)
Pt with chronic back pain in the lower back[facility] no relief per pt.  Pt states that he has Appt to see Neurosurgeon 2/25

## 2012-03-12 ENCOUNTER — Ambulatory Visit: Payer: Self-pay | Admitting: Cardiology

## 2012-03-12 DIAGNOSIS — IMO0002 Reserved for concepts with insufficient information to code with codable children: Secondary | ICD-10-CM | POA: Diagnosis not present

## 2012-03-12 DIAGNOSIS — I2699 Other pulmonary embolism without acute cor pulmonale: Secondary | ICD-10-CM

## 2012-03-15 ENCOUNTER — Ambulatory Visit (INDEPENDENT_AMBULATORY_CARE_PROVIDER_SITE_OTHER): Payer: PRIVATE HEALTH INSURANCE

## 2012-03-15 DIAGNOSIS — I2699 Other pulmonary embolism without acute cor pulmonale: Secondary | ICD-10-CM | POA: Diagnosis not present

## 2012-03-15 DIAGNOSIS — Z7901 Long term (current) use of anticoagulants: Secondary | ICD-10-CM

## 2012-03-15 LAB — POCT INR: INR: 2.4

## 2012-03-19 ENCOUNTER — Other Ambulatory Visit: Payer: Self-pay | Admitting: Neurological Surgery

## 2012-03-19 DIAGNOSIS — M549 Dorsalgia, unspecified: Secondary | ICD-10-CM

## 2012-03-21 ENCOUNTER — Telehealth: Payer: Self-pay | Admitting: Cardiology

## 2012-03-21 NOTE — Telephone Encounter (Signed)
Spoke to patient was told ok with Dr.Jordan to hold coumadin 4 days prior to lumbar myelogram.Permission form faxed back to [facility] fax # [phone].

## 2012-03-21 NOTE — Telephone Encounter (Addendum)
Pt calling re [facility] on [address] [phone], sending a request for him to be off coumadin for 4 days prior to having a myelogram, can we get this info back to them today, pt waiting to have this test done,  pls call pt when done [phone]

## 2012-03-25 ENCOUNTER — Ambulatory Visit
Admission: RE | Admit: 2012-03-25 | Discharge: 2012-03-25 | Disposition: A | Discharge status: Home / Self Care | Payer: 59 | Source: Ambulatory Visit | Attending: Neurological Surgery | Admitting: Neurological Surgery

## 2012-03-25 ENCOUNTER — Ambulatory Visit (INDEPENDENT_AMBULATORY_CARE_PROVIDER_SITE_OTHER): Payer: 59

## 2012-03-25 ENCOUNTER — Ambulatory Visit: Payer: 59

## 2012-03-25 DIAGNOSIS — Z7901 Long term (current) use of anticoagulants: Secondary | ICD-10-CM

## 2012-03-25 DIAGNOSIS — I2699 Other pulmonary embolism without acute cor pulmonale: Secondary | ICD-10-CM

## 2012-03-25 LAB — POCT INR: INR: 1.7

## 2012-03-26 ENCOUNTER — Ambulatory Visit (INDEPENDENT_AMBULATORY_CARE_PROVIDER_SITE_OTHER): Payer: PRIVATE HEALTH INSURANCE | Admitting: *Deleted

## 2012-03-26 DIAGNOSIS — I2699 Other pulmonary embolism without acute cor pulmonale: Secondary | ICD-10-CM | POA: Diagnosis not present

## 2012-03-26 DIAGNOSIS — Z7901 Long term (current) use of anticoagulants: Secondary | ICD-10-CM

## 2012-03-26 LAB — POCT INR: INR: 1.4

## 2012-03-27 ENCOUNTER — Ambulatory Visit
Admission: RE | Admit: 2012-03-27 | Discharge: 2012-03-27 | Disposition: A | Discharge status: Home / Self Care | Payer: 59 | Source: Ambulatory Visit | Attending: Neurological Surgery | Admitting: Neurological Surgery

## 2012-03-27 VITALS — BP 104/63 | HR 63

## 2012-03-27 DIAGNOSIS — M4310 Spondylolisthesis, site unspecified: Secondary | ICD-10-CM | POA: Diagnosis not present

## 2012-03-27 DIAGNOSIS — M549 Dorsalgia, unspecified: Secondary | ICD-10-CM

## 2012-03-27 DIAGNOSIS — M5126 Other intervertebral disc displacement, lumbar region: Secondary | ICD-10-CM | POA: Diagnosis not present

## 2012-03-27 MED ORDER — HYDROMORPHONE HCL PF 2 MG/ML IJ SOLN
2.0000 mg | Freq: Once | INTRAMUSCULAR | Status: AC
  Administered 2012-03-27: 2 mg via INTRAMUSCULAR

## 2012-03-27 MED ORDER — DIAZEPAM 5 MG PO TABS
5.0000 mg | ORAL_TABLET | Freq: Once | ORAL | Status: AC
  Administered 2012-03-27: 5 mg via ORAL

## 2012-03-27 MED ORDER — ONDANSETRON HCL 4 MG/2ML IJ SOLN
4.0000 mg | Freq: Once | INTRAMUSCULAR | Status: AC
  Administered 2012-03-27: 4 mg via INTRAMUSCULAR

## 2012-03-27 MED ORDER — MEPERIDINE HCL 100 MG/ML IJ SOLN
100.0000 mg | Freq: Once | INTRAMUSCULAR | Status: AC
  Administered 2012-03-27: 100 mg via INTRAMUSCULAR

## 2012-03-27 MED ORDER — IOHEXOL 180 MG/ML  SOLN
18.0000 mL | Freq: Once | INTRAMUSCULAR | Status: AC | PRN
  Administered 2012-03-27: 18 mL via INTRATHECAL

## 2012-03-27 NOTE — Progress Notes (Signed)
Pt has been off coumadin for 7 days and off tramadol for the past 4 days.

## 2012-04-01 DIAGNOSIS — N320 Bladder-neck obstruction: Secondary | ICD-10-CM | POA: Diagnosis not present

## 2012-04-01 DIAGNOSIS — R972 Elevated prostate specific antigen [PSA]: Secondary | ICD-10-CM | POA: Diagnosis not present

## 2012-04-01 DIAGNOSIS — N529 Male erectile dysfunction, unspecified: Secondary | ICD-10-CM | POA: Diagnosis not present

## 2012-04-02 DIAGNOSIS — IMO0002 Reserved for concepts with insufficient information to code with codable children: Secondary | ICD-10-CM | POA: Diagnosis not present

## 2012-04-04 ENCOUNTER — Ambulatory Visit (INDEPENDENT_AMBULATORY_CARE_PROVIDER_SITE_OTHER): Payer: PRIVATE HEALTH INSURANCE | Admitting: *Deleted

## 2012-04-04 DIAGNOSIS — I2699 Other pulmonary embolism without acute cor pulmonale: Secondary | ICD-10-CM

## 2012-04-04 DIAGNOSIS — Z7901 Long term (current) use of anticoagulants: Secondary | ICD-10-CM

## 2012-04-04 LAB — POCT INR: INR: 2

## 2012-04-05 ENCOUNTER — Telehealth: Payer: Self-pay | Admitting: *Deleted

## 2012-04-05 NOTE — Telephone Encounter (Signed)
Message copied by Carmela Hurt on Fri Apr 05, 2012  8:15 AM ------      Message from: [name], [name]      Created: Thu Apr 04, 2012  4:39 PM      Regarding: RE: surgical clearance       I would not bridge him. I would just hold coumadin 4-5 days before surgery.            Peter [name] MD, [facility]                  ----- Message -----         From: Carmela Hurt, RN         Sent: 04/04/2012   8:15 AM           To: [name] [name], MD, Carmela Hurt, RN, #      Subject: surgical clearance                                       Pt is having back surgery either first or second week in April by Dr Yetta Barre, he has appt with you 04/18/2012, please let me know if we need to bridge this pt with lovenox.            Thanks, Selena Batten       ------

## 2012-04-18 ENCOUNTER — Ambulatory Visit (INDEPENDENT_AMBULATORY_CARE_PROVIDER_SITE_OTHER): Payer: 59 | Admitting: Cardiology

## 2012-04-18 ENCOUNTER — Encounter: Payer: Self-pay | Admitting: Cardiology

## 2012-04-18 VITALS — BP 106/76 | HR 70 | Ht 67.0 in | Wt 277.4 lb

## 2012-04-18 DIAGNOSIS — I4891 Unspecified atrial fibrillation: Secondary | ICD-10-CM

## 2012-04-18 DIAGNOSIS — I2510 Atherosclerotic heart disease of native coronary artery without angina pectoris: Secondary | ICD-10-CM | POA: Diagnosis not present

## 2012-04-18 DIAGNOSIS — I2699 Other pulmonary embolism without acute cor pulmonale: Secondary | ICD-10-CM | POA: Diagnosis not present

## 2012-04-18 DIAGNOSIS — I480 Paroxysmal atrial fibrillation: Secondary | ICD-10-CM

## 2012-04-18 DIAGNOSIS — I712 Thoracic aortic aneurysm, without rupture, unspecified: Secondary | ICD-10-CM

## 2012-04-18 DIAGNOSIS — I10 Essential (primary) hypertension: Secondary | ICD-10-CM

## 2012-04-18 DIAGNOSIS — Z7901 Long term (current) use of anticoagulants: Secondary | ICD-10-CM

## 2012-04-18 NOTE — Patient Instructions (Signed)
Reduce your Diovan to 80 mg daily  Reduce Lasix to 80 mg twice a day. Watch closely for increased swelling or weight gain.  You are cleared for your back surgery. Stop coumadin 5 days before. I would consider switching you to Xarelto post op.

## 2012-04-18 NOTE — Progress Notes (Signed)
Carlos Dawson Date of Birth: Jun 09, 1940 Medical Record #[id number]  History of Present Illness: Carlos Dawson is seen today for preoperative evaluation for lumbar surgery. He has multiple medical issues including CAD, thoracic aneurysm, pulmonary emboli, morbid obesity with prior lap banding, HTN, chronic edema and PAF. He he is on coumadin. He underwent stenting of the obtuse marginal vessel in September of 2012. His episode of pulmonary embolus was following an Achilles tendon repair when he was immobilized for a long time. He did have followup venous Dopplers this past summer which were normal. He reports that he now has a herniated disc in his back and lumbar stenosis. He has neuropathic symptoms. Surgery has been recommended. The patient is still on BiPAP for his sleep apnea. He denies any significant shortness of breath or chest pain. He's had no increase in edema. His blood pressure has been doing very well and he wants to try and reduce his medications.  Current Outpatient Prescriptions on File Prior to Visit  Medication Sig Dispense Refill  . albuterol (PROVENTIL HFA;VENTOLIN HFA) 108 (90 BASE) MCG/ACT inhaler Inhale 2 puffs into the lungs every 6 (six) hours as needed. For shortness of breath      . AMBULATORY NON FORMULARY MEDICATION Place 1 application rectally 4 (four) times daily. Diltiazem 2% compounded suspension.  1 Tube  2  . aspirin 81 MG tablet Take 81 mg by mouth daily.       Marland Kitchen atenolol (TENORMIN) 25 MG tablet Take 1 tablet (25 mg total) by mouth daily.  90 tablet  3  . beclomethasone (QVAR) 80 MCG/ACT inhaler Inhale 2 puffs into the lungs 2 (two) times daily.  1 Inhaler  5  . finasteride (PROSCAR) 5 MG tablet Take 1 tablet (5 mg total) by mouth daily.  90 tablet  3  . fluticasone (FLONASE) 50 MCG/ACT nasal spray Place 2 sprays into the nose as needed. For allergies      . furosemide (LASIX) 80 MG tablet Take 1 1/2 tablets two times a day  90 tablet  3  . gabapentin (NEURONTIN) 100  MG capsule Take 100 mg by mouth 3 (three) times daily. 200mg  for days 1 to 4.  400mg  for days 5 through 7. 600mg  day 7 onward.      . montelukast (SINGULAIR) 10 MG tablet Take 1 tablet (10 mg total) by mouth at bedtime.  30 tablet  5  . NITROSTAT 0.4 MG SL tablet TAKE 1 TABLET UNDER THE TONGUE EVERY 5 MINUTES AS NEEDED UP TO 3 DOSES  25 tablet  1  . oxyCODONE-acetaminophen (PERCOCET/ROXICET) 5-325 MG per tablet Take 1 tablet by mouth every 6 (six) hours as needed for pain.  15 tablet  0  . pantoprazole (PROTONIX) 40 MG tablet Take 1 tablet (40 mg total) by mouth daily.  90 tablet  3  . potassium chloride SA (KLOR-CON M20) 20 MEQ tablet Take 2 tablets (40 mEq total) by mouth 2 (two) times daily.  360 tablet  3  . RANEXA 500 MG 12 hr tablet TAKE 1 TABLET BY MOUTH TWICE A DAY  60 tablet  5  . rosuvastatin (CRESTOR) 10 MG tablet Take 1 tablet (10 mg total) by mouth at bedtime.  90 tablet  3  . spironolactone (ALDACTONE) 25 MG tablet Take 1 tablet (25 mg total) by mouth daily.  90 tablet  3  . Tamsulosin HCl (FLOMAX) 0.4 MG CAPS Take 1 capsule (0.4 mg total) by mouth daily.  90 capsule  3  .  traMADol (ULTRAM) 50 MG tablet Take 50 mg by mouth every 6 (six) hours as needed.        . valsartan (DIOVAN) 160 MG tablet Take 1 tablet (160 mg total) by mouth daily.  90 tablet  3  . VITAMIN D, CHOLECALCIFEROL, PO Take 5,000 mg/day by mouth daily.       Marland Kitchen warfarin (COUMADIN) 5 MG tablet Take as directed by anticoagulation clinic  60 tablet  2   No current facility-administered medications on file prior to visit.    Allergies  Allergen Reactions  . Morphine Itching  . Adhesive (Tape) Itching and Rash    Where applied.    Past Medical History  Diagnosis Date  . Hypertension   . Obesities, morbid   . Pulmonary embolism     2008  . Anticoagulant long-term use   . CAD (coronary artery disease)     s/p BMS to 2nd OM in Sept 2012  . Thoracic aortic aneurysm     followed by Dr. [name]; last CT November 11, 2010 and unchanged  . OA (osteoarthritis)   . LVH (left ventricular hypertrophy)   . Colonic polyp   . OSA (obstructive sleep apnea)     PSG 03/30/97 AHI 21, BPAP 13/9  . Mild intermittent asthma   . Hemorrhoids   . Generalized headaches   . SOB (shortness of breath)     using oxygen with exercise  . Diastolic dysfunction   . ASCVD (arteriosclerotic cardiovascular disease)     Prior BMS to the 2nd OM in September 2012; with repeat cath in October showing patency  . Sleep apnea   . GERD (gastroesophageal reflux disease)   . IBS (irritable bowel syndrome)   . Aortic root enlargement   . Ascending aortic aneurysm     recent scan in October 2012 showing no change; followed by Dr. [name]  . PAF (paroxysmal atrial fibrillation)     treated with Coumadin  . Hearing loss   . Contact lens/glasses fitting     Past Surgical History  Procedure Laterality Date  . Hemorroidectomy    . Vasectomy    . Cervical spine surgery  06/02/2010    lower back and neck  . Gastric bypass  05/27/2007  . Cholecystectomy    . Rotator cuff repair      both  . Achilles tendon repair    . Cardiac catheterization  2006  . Coronary stent placement  Sept 2012    2nd OM with BMS  . Cardiac catheterization  October 2012    Stent patent  . Appendectomy      History  Smoking status  . Former Smoker -- 1.50 packs/day for 30 years  . Quit date: 01/24/1992  Smokeless tobacco  . Never Used    Comment: Stopped in 1983    History  Alcohol Use No    Family History  Problem Relation Age of Onset  . Heart disease Mother   . Diabetes Mother   . Other Mother     stent placement  . Emphysema Father 10  . Heart attack Sister     Review of Systems: The review of systems is per the HPI. All other systems were reviewed and are negative.  Physical Exam: BP 106/76  Pulse 70  Ht 5\' 7"  (1.702 m)  Wt 277 lb 6.4 oz (125.828 kg)  BMI 43.44 kg/m2 Patient is a pleasant white male no acute distress. He  is morbidly obese. Skin is warm  and dry. Color is normal.  HEENT is unremarkable. Normocephalic/atraumatic. PERRL. Sclera are nonicteric. Neck is supple. No masses. No JVD. Lungs are clear. Cardiac exam shows a regular rate and rhythm. No gallop or murmur. Abdomen is obese but soft. Bowel sounds are positive. Extremities are full but without significant edema. Gait and ROM are intact. No gross neurologic deficits noted.   LABORATORY DATA:  Lab Results  Component Value Date   WBC 8.9 03/09/2012   HGB 13.4 03/09/2012   HCT 40.1 03/09/2012   PLT 146* 03/09/2012   GLUCOSE 157* 03/09/2012   CHOL 124 11/20/2011   TRIG 104.0 11/20/2011   HDL 41.10 11/20/2011   LDLCALC 62 11/20/2011   ALT 11 11/20/2011   AST 19 11/20/2011   NA 140 03/09/2012   K 4.9 03/09/2012   CL 102 03/09/2012   CREATININE 1.30 03/09/2012   BUN 19 03/09/2012   CO2 31 03/09/2012   TSH 1.731 06/09/2010   INR 2.0 04/04/2012   HGBA1C  Value: 6.7 (NOTE)   The ADA recommends the following therapeutic goals for glycemic   control related to Hgb A1C measurement:   Goal of Therapy:   < 7.0% Hgb A1C   Action Suggested:  > 8.0% Hgb A1C   Ref:  Diabetes Care, 22, Suppl. 1, 1999* 08/19/2006   Lab Results  Component Value Date   CHOL 124 11/20/2011   ECG today demonstrates normal sinus rhythm with a first degree AV block and occasional PACs. It is otherwise normal.  Assessment / Plan:  1. CAD - prior stent in 2012. No recurrent chest pain.   2. Diastolic dysfunction -  Looks well compensated. I have suggested a reduction in his Lasix dose to 80 mg twice a day. He will monitor closely for increased swelling or weight gain. If these occur he could increase his dose back again. Continue sodium restriction.  3. HTN -  Blood pressure looks good. We will reduce his Diovan to 80 mg daily.  4. Morbid obesity  5. Thoracic aneurysm -followed by Dr. [name] annually. Last CT scan in July of 2013 showed stable appearance.  6. PAF - on coumadin -  in sinus on exam today. Patient would like to consider switching to one of the novel anticoagulants. I think he would be a good candidate for this. Since he has upcoming surgery will just wait until after his surgery and begin Xarelto 20 mg daily postoperatively.  7. HLD  8. Lumbar stenosis/herniated disc. Patient is cleared for surgery from a cardiac standpoint. I would recommend stopping his Coumadin 5 days before. I do not think he needs bridging Lovenox. Postoperatively I would start him on Xarelto 20 mg per day. I will followup again in 3 months.

## 2012-04-26 ENCOUNTER — Ambulatory Visit: Payer: 59 | Admitting: Cardiology

## 2012-05-07 ENCOUNTER — Other Ambulatory Visit: Payer: Self-pay | Admitting: Neurological Surgery

## 2012-05-07 DIAGNOSIS — L538 Other specified erythematous conditions: Secondary | ICD-10-CM | POA: Diagnosis not present

## 2012-05-14 ENCOUNTER — Other Ambulatory Visit: Payer: Self-pay | Admitting: Cardiology

## 2012-05-14 ENCOUNTER — Other Ambulatory Visit: Payer: Self-pay | Admitting: Pulmonary Disease

## 2012-05-14 NOTE — Telephone Encounter (Signed)
Called and spoke with Carlos Dawson about his refill for Spironolactone and he stated he has not gotten it through the [place] yet and does need a refill at this time. Refill sent into [facility].

## 2012-05-18 ENCOUNTER — Other Ambulatory Visit: Payer: Self-pay | Admitting: Cardiology

## 2012-05-20 ENCOUNTER — Encounter (HOSPITAL_COMMUNITY): Payer: Self-pay | Admitting: Pharmacy Technician

## 2012-05-21 ENCOUNTER — Ambulatory Visit (INDEPENDENT_AMBULATORY_CARE_PROVIDER_SITE_OTHER): Payer: 59 | Admitting: Pharmacist

## 2012-05-21 ENCOUNTER — Encounter (HOSPITAL_COMMUNITY)
Admission: RE | Admit: 2012-05-21 | Discharge: 2012-05-21 | Disposition: A | Discharge status: Home / Self Care | Payer: 59 | Source: Ambulatory Visit | Attending: Neurological Surgery | Admitting: Neurological Surgery

## 2012-05-21 ENCOUNTER — Encounter (HOSPITAL_COMMUNITY): Payer: Self-pay

## 2012-05-21 DIAGNOSIS — Z01818 Encounter for other preprocedural examination: Secondary | ICD-10-CM | POA: Diagnosis not present

## 2012-05-21 DIAGNOSIS — I10 Essential (primary) hypertension: Secondary | ICD-10-CM | POA: Diagnosis not present

## 2012-05-21 DIAGNOSIS — M479 Spondylosis, unspecified: Secondary | ICD-10-CM | POA: Diagnosis not present

## 2012-05-21 DIAGNOSIS — Z87891 Personal history of nicotine dependence: Secondary | ICD-10-CM | POA: Diagnosis not present

## 2012-05-21 DIAGNOSIS — I712 Thoracic aortic aneurysm, without rupture, unspecified: Secondary | ICD-10-CM | POA: Insufficient documentation

## 2012-05-21 DIAGNOSIS — I517 Cardiomegaly: Secondary | ICD-10-CM | POA: Insufficient documentation

## 2012-05-21 DIAGNOSIS — K219 Gastro-esophageal reflux disease without esophagitis: Secondary | ICD-10-CM | POA: Insufficient documentation

## 2012-05-21 DIAGNOSIS — G4730 Sleep apnea, unspecified: Secondary | ICD-10-CM | POA: Insufficient documentation

## 2012-05-21 DIAGNOSIS — Z01812 Encounter for preprocedural laboratory examination: Secondary | ICD-10-CM | POA: Diagnosis not present

## 2012-05-21 DIAGNOSIS — I2510 Atherosclerotic heart disease of native coronary artery without angina pectoris: Secondary | ICD-10-CM | POA: Insufficient documentation

## 2012-05-21 DIAGNOSIS — I2699 Other pulmonary embolism without acute cor pulmonale: Secondary | ICD-10-CM

## 2012-05-21 LAB — CBC WITH DIFFERENTIAL/PLATELET
Basophils Absolute: 0 10*3/uL (ref 0.0–0.1)
Basophils Relative: 1 % (ref 0–1)
Eosinophils Absolute: 0.1 10*3/uL (ref 0.0–0.7)
Eosinophils Relative: 2 % (ref 0–5)
HCT: 39.8 % (ref 39.0–52.0)
Hemoglobin: 13.5 g/dL (ref 13.0–17.0)
Lymphocytes Relative: 24 % (ref 12–46)
Lymphs Abs: 1.6 10*3/uL (ref 0.7–4.0)
MCH: 29.6 pg (ref 26.0–34.0)
MCHC: 33.9 g/dL (ref 30.0–36.0)
MCV: 87.3 fL (ref 78.0–100.0)
Monocytes Absolute: 0.5 10*3/uL (ref 0.1–1.0)
Monocytes Relative: 7 % (ref 3–12)
Neutro Abs: 4.4 10*3/uL (ref 1.7–7.7)
Neutrophils Relative %: 66 % (ref 43–77)
Platelets: 137 10*3/uL — ABNORMAL LOW (ref 150–400)
RBC: 4.56 MIL/uL (ref 4.22–5.81)
RDW: 13.6 % (ref 11.5–15.5)
WBC: 6.7 10*3/uL (ref 4.0–10.5)

## 2012-05-21 LAB — BASIC METABOLIC PANEL
BUN: 18 mg/dL (ref 6–23)
CO2: 27 mEq/L (ref 19–32)
Calcium: 9.2 mg/dL (ref 8.4–10.5)
Chloride: 102 mEq/L (ref 96–112)
Creatinine, Ser: 1.07 mg/dL (ref 0.50–1.35)
GFR calc Af Amer: 79 mL/min — ABNORMAL LOW (ref >90)
GFR calc non Af Amer: 68 mL/min — ABNORMAL LOW (ref >90)
Glucose, Bld: 270 mg/dL — ABNORMAL HIGH (ref 70–99)
Potassium: 3.9 mEq/L (ref 3.5–5.1)
Sodium: 138 mEq/L (ref 135–145)

## 2012-05-21 LAB — TYPE AND SCREEN
ABO/RH(D): O POS
Antibody Screen: NEGATIVE

## 2012-05-21 LAB — SURGICAL PCR SCREEN
MRSA, PCR: NEGATIVE
Staphylococcus aureus: NEGATIVE

## 2012-05-21 LAB — PROTIME-INR
INR: 2.01 — ABNORMAL HIGH (ref 0.00–1.49)
Prothrombin Time: 22 seconds — ABNORMAL HIGH (ref 11.6–15.2)

## 2012-05-21 LAB — APTT: aPTT: 50 s — ABNORMAL HIGH (ref 24–37)

## 2012-05-21 NOTE — Pre-Procedure Instructions (Signed)
DEUNTA BENEKE  05/21/2012   Your procedure is scheduled on:  May 30, 2012  Report to [facility] Short Stay Center at 5:30 AM (Use entrance A)  Call this number if you have problems the morning of surgery: [phone]   Remember:   Do not eat food or drink liquids after midnight.   Take these medicines the morning of surgery with A SIP OF WATER: inhaler, atenolol, flonase, nitroglycerin as needed, protonix, flomax, tramadol  STOP COUMADIN 5 DAYS PRIOR TO SURGERY   Do not wear jewelry, make-up or nail polish.  Do not wear lotions, powders, or perfumes. You may wear deodorant.  Do not shave 48 hours prior to surgery. Men may shave face and neck.  Do not bring valuables to the hospital.  Contacts, dentures or bridgework may not be worn into surgery.  Leave suitcase in the car. After surgery it may be brought to your room.  For patients admitted to the hospital, checkout time is 11:00 AM the day of  discharge.   Patients discharged the day of surgery will not be allowed to drive  home.  Name and phone number of your driver:   Special Instructions: Shower using CHG 2 nights before surgery and the night before surgery.  If you shower the day of surgery use CHG.  Use special wash - you have one bottle of CHG for all showers.  You should use approximately 1/3 of the bottle for each shower.   Please read over the following fact sheets that you were given: Pain Booklet, Coughing and Deep Breathing, Blood Transfusion Information and Surgical Site Infection Prevention

## 2012-05-21 NOTE — Progress Notes (Addendum)
Anesthesia Chart Review:  Patient is a 72 year old male posted for L4-5 laminectomy with posterolateral fusion and pedicle screws, possible L4-5 transforaminal lumbar interbody fusion by Dr. Marikay Alar on 05/30/12.    History includes former smoker, morbid obesity with history of prior gastric bypass/lap banding, HTN, CAD s/p OM2 stent (BMS) 09/2010, LVH, diastolic dysfunction, paroxsymal afib, post-operative PE '08, thoracic aortic aneurysm, OSA on BiPAP (Dr. Craige Cotta), GERD, asthma, osteoarthritis, headaches. PCP is Dr. Lyna Poser at [facility].  He also receives some care at the [facility].  Cardiologist is Dr. [name].  Patient was seen for a preoperative visit on 04/18/12.  EKG then showed SR with first degree AVB, PACs.  By notes, "Patient is cleared for surgery from a cardiac standpoint. I would recommend stopping his Coumadin 5 days before. I do not think he needs bridging Lovenox. Postoperatively I would start him on Xarelto 20 mg per day."    Cardiac cath on 11/17/10 showed LAD with minor irregularities and is very small at its terminus, patent OM stent otherwise no significant disease of the LCx, normal RCA.  Continued medical therapy was recommended.   Echo on 09/29/09 showed marked LVH with impaired LV relaxation with normal LV systolic function, mild biatrial enlargement, mild AS, trace MR/TR, moderate aortic root dilation.  EF 55-60%.  CTA of the chest on 09/14/11 showed: 1. Stable aneurysmal dilatation of the ascending thoracic aorta (4.7 cm proximal ascending thoracic aorta, 3.7 cm at aortic arch, 2.9 cm descending thoracic aorta).  2. Increased caliber of the pulmonary arteries (4.1 cm) suggesting PA hypertension.  CXR on 05/21/12 showed stable cardiomegaly without acute process.  Preoperative labs noted.  Non-fasting glucose was 270.  I called and spoke with Mr. Reas who reported that he ate Frosted Flakes cereal for breakfast this morning.  There is no documented history of DM2,  although he says his glucose readings have been "borderline" high in the past.  CBC WNL except PLT count was 137K (previously 146K 03/09/12).  PT/PTT elevated. (He is not scheduled to hold his Coumadin until 05/24/12.)  I spoke with Dr. Kevan Ny regarding patient's planned surgery and hyperglycemia.  He will have his nurse contact Mr. Lundberg for further evaluation/recommendations before surgery.  Unless, plans for surgery change following his evaluation by Dr. Kevan Ny, I will plan to repeat PT/PTT and get fasting CBG on arrival. I updated Erie Noe at Dr. Yetta Barre' office.  Velna Ochs [facility] Short Stay Center/Anesthesiology Phone [phone] 05/21/2012 2:39 PM  Addendum: 05/27/12 1315 Patient was seen by Dr. Kevan Ny on 05/22/12 and started on metformin 500 mg 1/2 tab BID.  He instructed patient to hold it the day prior to surgery and resume 48 hours post-operatively.  Patient's glucose was 97 and A1C was 6.2 on 05/22/12.

## 2012-05-22 DIAGNOSIS — E119 Type 2 diabetes mellitus without complications: Secondary | ICD-10-CM | POA: Diagnosis not present

## 2012-05-28 ENCOUNTER — Ambulatory Visit (INDEPENDENT_AMBULATORY_CARE_PROVIDER_SITE_OTHER): Payer: 59 | Admitting: Pulmonary Disease

## 2012-05-28 ENCOUNTER — Encounter: Payer: Self-pay | Admitting: Pulmonary Disease

## 2012-05-28 VITALS — BP 126/54 | HR 81 | Temp 98.1°F | Ht 67.0 in | Wt 285.8 lb

## 2012-05-28 DIAGNOSIS — Z01811 Encounter for preprocedural respiratory examination: Secondary | ICD-10-CM | POA: Diagnosis not present

## 2012-05-28 DIAGNOSIS — G4733 Obstructive sleep apnea (adult) (pediatric): Secondary | ICD-10-CM | POA: Diagnosis not present

## 2012-05-28 DIAGNOSIS — J45909 Unspecified asthma, uncomplicated: Secondary | ICD-10-CM

## 2012-05-28 DIAGNOSIS — J4530 Mild persistent asthma, uncomplicated: Secondary | ICD-10-CM

## 2012-05-28 NOTE — Assessment & Plan Note (Signed)
Better since the additional of singulair.

## 2012-05-28 NOTE — Progress Notes (Signed)
Chief Complaint  Patient presents with  . Asthma    breathing has been good. denies any cough. slight wheezing but no chest tx. pt is going to have surgery on thursday on lower back  . Sleep Apnea    Pt states he wears his CPAP 4/5 nights out of the week. Pt states it is drying his mouth out even with the humidifier.     History of Present Illness: CLEBURNE SAVINI is a 72 y.o. male former smoker with dyspnea, OSA on BPAP 12/8, and asthma.  His breathing has been doing better.  He is not having much cough, wheeze, or sputum.    He feels his BiPAP settings are adequate.  His main problem with his is related to mouth dryness.  He is having debilitating back pain, and is scheduled for surgery with Dr. Yetta Barre latter this month.  Tests: PSG 03/30/97 >> AHI 21 ONO with BPAP and room air 12/12/10 >> Test time 6 hrs 56 min.  Basal SpO2 93.1%, low SpO2 86%.  Spent 2.8 min with SpO2 < 88%. PFT 12/20/10 >> FEV1 2.13(79%), FEV1% 71, TLC 5.80(100%), DLCO 57%, no BD CT chest 09/14/11 >> Stable dilatation of ascending thoracic aorta 4.7 cm, PA 4.1 cm BPAP 09/30/11 to 11/28/11>>Used on 29 of 60 nights with average 3 hrs 21 min. Average AHI 3 with BiPAP 13/9 cm H2O.  He  has a past medical history of Hypertension; Obesities, morbid; Pulmonary embolism; Anticoagulant long-term use; CAD (coronary artery disease); Thoracic aortic aneurysm; OA (osteoarthritis); LVH (left ventricular hypertrophy); Colonic polyp; Mild intermittent asthma; Hemorrhoids; Generalized headaches; SOB (shortness of breath); Diastolic dysfunction; ASCVD (arteriosclerotic cardiovascular disease); GERD (gastroesophageal reflux disease); IBS (irritable bowel syndrome); Aortic root enlargement; Ascending aortic aneurysm; PAF (paroxysmal atrial fibrillation); Hearing loss; Contact lens/glasses fitting; OSA (obstructive sleep apnea); and Sleep apnea.  He  has past surgical history that includes Hemorroidectomy; Vasectomy; Cervical spine surgery  (06/02/2010); Gastric bypass (05/27/2007); Cholecystectomy; Rotator cuff repair; Achilles tendon repair; Cardiac catheterization (2006); Coronary stent placement (Sept 2012); Cardiac catheterization (October 2012); Appendectomy; and Eye surgery.   Current Outpatient Prescriptions on File Prior to Visit  Medication Sig Dispense Refill  . albuterol (PROVENTIL HFA;VENTOLIN HFA) 108 (90 BASE) MCG/ACT inhaler Inhale 2 puffs into the lungs every 6 (six) hours as needed for wheezing.      Marland Kitchen aspirin EC 81 MG tablet Take 81 mg by mouth daily.      Marland Kitchen atenolol (TENORMIN) 25 MG tablet Take 25 mg by mouth daily.      . Cholecalciferol (VITAMIN D) 2000 UNITS CAPS Take by mouth 2 (two) times daily.      . finasteride (PROSCAR) 5 MG tablet Take 5 mg by mouth daily.      . fluticasone (FLONASE) 50 MCG/ACT nasal spray Place 2 sprays into the nose daily as needed for rhinitis.      . furosemide (LASIX) 80 MG tablet Take 80 mg by mouth 2 (two) times daily.      . montelukast (SINGULAIR) 10 MG tablet Take 10 mg by mouth at bedtime.      . nitroGLYCERIN (NITROSTAT) 0.4 MG SL tablet Place 0.4 mg under the tongue every 5 (five) minutes as needed for chest pain.      . pantoprazole (PROTONIX) 40 MG tablet Take 40 mg by mouth daily.      . potassium chloride SA (K-DUR,KLOR-CON) 20 MEQ tablet Take 40 mEq by mouth 2 (two) times daily.      . ranolazine (  RANEXA) 500 MG 12 hr tablet Take 500 mg by mouth 2 (two) times daily.      . rosuvastatin (CRESTOR) 10 MG tablet Take 10 mg by mouth daily.      Marland Kitchen spironolactone (ALDACTONE) 25 MG tablet Take 25 mg by mouth daily.      . tamsulosin (FLOMAX) 0.4 MG CAPS Take 0.4 mg by mouth daily.      . traMADol (ULTRAM) 50 MG tablet Take 50 mg by mouth every 6 (six) hours as needed for pain.      . valsartan (DIOVAN) 160 MG tablet Take 160 mg by mouth daily.      Marland Kitchen warfarin (COUMADIN) 5 MG tablet Take 2.5-5 mg by mouth daily. Takes 2.5mg  every Monday and Friday.  Takes 5mg  on all remaining  days.   curretnly on hold       No current facility-administered medications on file prior to visit.    Allergies  Allergen Reactions  . Morphine Itching  . Adhesive (Tape) Itching and Rash    Where applied.   Physical Exam:  General - Obese  HEENT - No sinus tenderness, no oral exudate, no LAN  Cardiac - s1s2 regular, no murmur  Chest - normal respiratory excursion, no wheeze/rales/dullness  Abdomen - obese, soft  Extremities - +1 ankle edema  Skin - no rashes  Neurologic - normal strength  Psychiatric - normal mood, behavior   Assessment/Plan:  Coralyn Helling, MD [facility] Pulmonary/Critical Care 05/28/2012, 4:19 PM Pager:  [phone] After 3pm call: [phone]

## 2012-05-28 NOTE — Patient Instructions (Signed)
Follow up in 6 months 

## 2012-05-28 NOTE — Assessment & Plan Note (Signed)
He is scheduled for lumbar spinal fusion.  His pulmonary conditions will not prevent him from being able to have surgery.  He will need close monitoring of his oxygenation during the post-operative period.  He will need to continue BiPAP after surgery.  He may need nebulized albuterol treatments after surgery.    Pulmonary assistance can be available if needed after surgery while he is in hospital.

## 2012-05-28 NOTE — Assessment & Plan Note (Signed)
He is compliant with BiPAP and reports benefit.  He is to continue BiPAP 12/8 cm H2O.  Advised him to try increasing his humidifier setting to alleviate mouth dryness, and he can also try using biotene.

## 2012-05-29 MED ORDER — DEXTROSE 5 % IV SOLN
3.0000 g | INTRAVENOUS | Status: AC
  Administered 2012-05-30: 3 g via INTRAVENOUS
  Filled 2012-05-29: qty 3000

## 2012-05-29 MED ORDER — DEXAMETHASONE SODIUM PHOSPHATE 10 MG/ML IJ SOLN
10.0000 mg | INTRAMUSCULAR | Status: AC
  Administered 2012-05-30: 10 mg via INTRAVENOUS

## 2012-05-30 ENCOUNTER — Inpatient Hospital Stay (HOSPITAL_COMMUNITY): Payer: 59 | Admitting: Anesthesiology

## 2012-05-30 ENCOUNTER — Encounter (HOSPITAL_COMMUNITY): Payer: Self-pay | Admitting: Vascular Surgery

## 2012-05-30 ENCOUNTER — Encounter (HOSPITAL_COMMUNITY)
Admission: RE | Disposition: A | Discharge status: Home / Self Care | Payer: Self-pay | Source: Ambulatory Visit | Attending: Neurological Surgery

## 2012-05-30 ENCOUNTER — Inpatient Hospital Stay (HOSPITAL_COMMUNITY)
Admission: RE | Admit: 2012-05-30 | Discharge: 2012-06-01 | DRG: 460 | Disposition: A | Discharge status: Home / Self Care | Payer: 59 | Source: Ambulatory Visit | Attending: Neurological Surgery | Admitting: Neurological Surgery

## 2012-05-30 ENCOUNTER — Encounter (HOSPITAL_COMMUNITY): Payer: Self-pay | Admitting: Neurological Surgery

## 2012-05-30 ENCOUNTER — Inpatient Hospital Stay (HOSPITAL_COMMUNITY): Payer: 59

## 2012-05-30 DIAGNOSIS — I10 Essential (primary) hypertension: Secondary | ICD-10-CM | POA: Diagnosis present

## 2012-05-30 DIAGNOSIS — IMO0002 Reserved for concepts with insufficient information to code with codable children: Secondary | ICD-10-CM

## 2012-05-30 DIAGNOSIS — Q762 Congenital spondylolisthesis: Secondary | ICD-10-CM | POA: Diagnosis not present

## 2012-05-30 DIAGNOSIS — I517 Cardiomegaly: Secondary | ICD-10-CM | POA: Diagnosis present

## 2012-05-30 DIAGNOSIS — K219 Gastro-esophageal reflux disease without esophagitis: Secondary | ICD-10-CM | POA: Diagnosis present

## 2012-05-30 DIAGNOSIS — J45909 Unspecified asthma, uncomplicated: Secondary | ICD-10-CM | POA: Diagnosis present

## 2012-05-30 DIAGNOSIS — I2510 Atherosclerotic heart disease of native coronary artery without angina pectoris: Secondary | ICD-10-CM | POA: Diagnosis not present

## 2012-05-30 DIAGNOSIS — Z87891 Personal history of nicotine dependence: Secondary | ICD-10-CM

## 2012-05-30 DIAGNOSIS — M47817 Spondylosis without myelopathy or radiculopathy, lumbosacral region: Secondary | ICD-10-CM | POA: Diagnosis not present

## 2012-05-30 DIAGNOSIS — I4891 Unspecified atrial fibrillation: Secondary | ICD-10-CM | POA: Diagnosis present

## 2012-05-30 DIAGNOSIS — I519 Heart disease, unspecified: Secondary | ICD-10-CM | POA: Diagnosis present

## 2012-05-30 DIAGNOSIS — Z7982 Long term (current) use of aspirin: Secondary | ICD-10-CM | POA: Diagnosis not present

## 2012-05-30 DIAGNOSIS — M1990 Unspecified osteoarthritis, unspecified site: Secondary | ICD-10-CM | POA: Diagnosis present

## 2012-05-30 DIAGNOSIS — G4733 Obstructive sleep apnea (adult) (pediatric): Secondary | ICD-10-CM | POA: Diagnosis present

## 2012-05-30 DIAGNOSIS — H9190 Unspecified hearing loss, unspecified ear: Secondary | ICD-10-CM | POA: Diagnosis present

## 2012-05-30 HISTORY — PX: LAMINECTOMY: SHX219

## 2012-05-30 LAB — APTT: aPTT: 45 seconds — ABNORMAL HIGH (ref 24–37)

## 2012-05-30 LAB — GLUCOSE, CAPILLARY
Glucose-Capillary: 139 mg/dL — ABNORMAL HIGH (ref 70–99)
Glucose-Capillary: 220 mg/dL — ABNORMAL HIGH (ref 70–99)
Glucose-Capillary: 159 mg/dL — ABNORMAL HIGH (ref 70–99)

## 2012-05-30 LAB — PROTIME-INR
INR: 1.24 (ref 0.00–1.49)
Prothrombin Time: 15.4 seconds — ABNORMAL HIGH (ref 11.6–15.2)

## 2012-05-30 SURGERY — POSTERIOR LUMBAR FUSION 1 LEVEL
Anesthesia: General | Site: Back | Wound class: Clean

## 2012-05-30 MED ORDER — SENNA 8.6 MG PO TABS
1.0000 | ORAL_TABLET | Freq: Two times a day (BID) | ORAL | Status: DC
  Administered 2012-05-30 – 2012-05-31 (×4): 8.6 mg via ORAL
  Filled 2012-05-30 (×9): qty 1

## 2012-05-30 MED ORDER — SODIUM CHLORIDE 0.9 % IJ SOLN: 3.0000 mL | INTRAMUSCULAR | Status: DC | PRN

## 2012-05-30 MED ORDER — NITROGLYCERIN 0.4 MG SL SUBL: 0.4000 mg | SUBLINGUAL_TABLET | SUBLINGUAL | Status: DC | PRN

## 2012-05-30 MED ORDER — SODIUM CHLORIDE 0.9 % IJ SOLN
3.0000 mL | Freq: Two times a day (BID) | INTRAMUSCULAR | Status: DC
  Administered 2012-05-30 – 2012-05-31 (×2): 3 mL via INTRAVENOUS

## 2012-05-30 MED ORDER — MENTHOL 3 MG MT LOZG: 1.0000 | LOZENGE | OROMUCOSAL | Status: DC | PRN

## 2012-05-30 MED ORDER — POTASSIUM CHLORIDE IN NACL 20-0.9 MEQ/L-% IV SOLN
INTRAVENOUS | Status: DC
  Administered 2012-05-30: 1000 mL via INTRAVENOUS
  Administered 2012-05-31 – 2012-06-01 (×3): via INTRAVENOUS
  Filled 2012-05-30 (×6): qty 1000

## 2012-05-30 MED ORDER — ASPIRIN EC 81 MG PO TBEC
81.0000 mg | DELAYED_RELEASE_TABLET | Freq: Every day | ORAL | Status: DC
Start: 2012-05-30 — End: 2012-06-01
  Administered 2012-05-30 – 2012-06-01 (×3): 81 mg via ORAL
  Filled 2012-05-30 (×4): qty 1

## 2012-05-30 MED ORDER — PHENOL 1.4 % MT LIQD: 1.0000 | OROMUCOSAL | Status: DC | PRN

## 2012-05-30 MED ORDER — THROMBIN 5000 UNITS EX SOLR
OROMUCOSAL | Status: DC | PRN
  Administered 2012-05-30: 10:00:00 via TOPICAL

## 2012-05-30 MED ORDER — ATENOLOL 25 MG PO TABS
25.0000 mg | ORAL_TABLET | Freq: Every day | ORAL | Status: DC
  Administered 2012-05-30 – 2012-06-01 (×3): 25 mg via ORAL
  Filled 2012-05-30 (×4): qty 1

## 2012-05-30 MED ORDER — CEFAZOLIN SODIUM 1-5 GM-% IV SOLN
1.0000 g | Freq: Once | INTRAVENOUS | Status: AC
  Administered 2012-05-30: 1 g via INTRAVENOUS

## 2012-05-30 MED ORDER — ACETAMINOPHEN 650 MG RE SUPP: 650.0000 mg | RECTAL | Status: DC | PRN

## 2012-05-30 MED ORDER — IRBESARTAN 75 MG PO TABS
75.0000 mg | ORAL_TABLET | Freq: Every day | ORAL | Status: DC
  Administered 2012-05-30 – 2012-06-01 (×3): 75 mg via ORAL
  Filled 2012-05-30 (×4): qty 1

## 2012-05-30 MED ORDER — RANOLAZINE ER 500 MG PO TB12
500.0000 mg | ORAL_TABLET | Freq: Two times a day (BID) | ORAL | Status: DC
  Administered 2012-05-30 – 2012-06-01 (×4): 500 mg via ORAL
  Filled 2012-05-30 (×7): qty 1

## 2012-05-30 MED ORDER — PHENYLEPHRINE HCL 10 MG/ML IJ SOLN
10.0000 mg | INTRAVENOUS | Status: DC | PRN
  Administered 2012-05-30: 25 ug/min via INTRAVENOUS

## 2012-05-30 MED ORDER — LACTATED RINGERS IV SOLN
INTRAVENOUS | Status: DC | PRN
  Administered 2012-05-30 (×4): via INTRAVENOUS

## 2012-05-30 MED ORDER — METFORMIN HCL 500 MG PO TABS
250.0000 mg | ORAL_TABLET | Freq: Two times a day (BID) | ORAL | Status: DC
  Administered 2012-05-30 – 2012-06-01 (×4): 250 mg via ORAL
  Filled 2012-05-30 (×9): qty 1

## 2012-05-30 MED ORDER — LIDOCAINE HCL (CARDIAC) 20 MG/ML IV SOLN
INTRAVENOUS | Status: DC | PRN
  Administered 2012-05-30: 50 mg via INTRAVENOUS

## 2012-05-30 MED ORDER — LORATADINE 10 MG PO TABS
10.0000 mg | ORAL_TABLET | Freq: Every day | ORAL | Status: DC
  Administered 2012-05-30 – 2012-06-01 (×3): 10 mg via ORAL
  Filled 2012-05-30 (×4): qty 1

## 2012-05-30 MED ORDER — METHOCARBAMOL 100 MG/ML IJ SOLN
500.0000 mg | Freq: Four times a day (QID) | INTRAVENOUS | Status: DC | PRN
  Filled 2012-05-30: qty 5

## 2012-05-30 MED ORDER — SODIUM CHLORIDE 0.9 % IV SOLN: 250.0000 mL | INTRAVENOUS | Status: DC

## 2012-05-30 MED ORDER — FINASTERIDE 5 MG PO TABS
5.0000 mg | ORAL_TABLET | Freq: Every day | ORAL | Status: DC
  Administered 2012-05-30 – 2012-06-01 (×3): 5 mg via ORAL
  Filled 2012-05-30 (×4): qty 1

## 2012-05-30 MED ORDER — HYDROMORPHONE HCL PF 1 MG/ML IJ SOLN: 0.2500 mg | INTRAMUSCULAR | Status: DC | PRN

## 2012-05-30 MED ORDER — ONDANSETRON HCL 4 MG/2ML IJ SOLN
INTRAMUSCULAR | Status: DC | PRN
  Administered 2012-05-30: 4 mg via INTRAVENOUS

## 2012-05-30 MED ORDER — THROMBIN 20000 UNITS EX SOLR
CUTANEOUS | Status: DC | PRN
  Administered 2012-05-30: 08:00:00 via TOPICAL

## 2012-05-30 MED ORDER — OXYCODONE-ACETAMINOPHEN 5-325 MG PO TABS
1.0000 | ORAL_TABLET | ORAL | Status: DC | PRN
  Administered 2012-05-30: 2 via ORAL
  Administered 2012-05-31: 1 via ORAL
  Filled 2012-05-30 (×2): qty 2
  Filled 2012-05-30: qty 1

## 2012-05-30 MED ORDER — CEFAZOLIN SODIUM 1-5 GM-% IV SOLN
INTRAVENOUS | Status: AC
  Filled 2012-05-30: qty 50

## 2012-05-30 MED ORDER — BACITRACIN 50000 UNITS IM SOLR
INTRAMUSCULAR | Status: AC
  Filled 2012-05-30: qty 1

## 2012-05-30 MED ORDER — FENTANYL CITRATE 0.05 MG/ML IJ SOLN
INTRAMUSCULAR | Status: DC | PRN
  Administered 2012-05-30 (×2): 25 ug via INTRAVENOUS
  Administered 2012-05-30 (×2): 50 ug via INTRAVENOUS
  Administered 2012-05-30: 25 ug via INTRAVENOUS
  Administered 2012-05-30: 100 ug via INTRAVENOUS
  Administered 2012-05-30 (×2): 50 ug via INTRAVENOUS
  Administered 2012-05-30 (×3): 25 ug via INTRAVENOUS

## 2012-05-30 MED ORDER — METHOCARBAMOL 500 MG PO TABS
500.0000 mg | ORAL_TABLET | Freq: Four times a day (QID) | ORAL | Status: DC | PRN
  Administered 2012-05-30 – 2012-05-31 (×2): 500 mg via ORAL
  Filled 2012-05-30 (×2): qty 1

## 2012-05-30 MED ORDER — PANTOPRAZOLE SODIUM 40 MG PO TBEC
40.0000 mg | DELAYED_RELEASE_TABLET | Freq: Every day | ORAL | Status: DC
  Administered 2012-05-30 – 2012-06-01 (×3): 40 mg via ORAL
  Filled 2012-05-30 (×4): qty 1

## 2012-05-30 MED ORDER — ACETAMINOPHEN 10 MG/ML IV SOLN
INTRAVENOUS | Status: AC
  Administered 2012-05-30: 1000 mg via INTRAVENOUS
  Filled 2012-05-30: qty 100

## 2012-05-30 MED ORDER — SODIUM CHLORIDE 0.9 % IR SOLN
Status: DC | PRN
  Administered 2012-05-30: 08:00:00

## 2012-05-30 MED ORDER — 0.9 % SODIUM CHLORIDE (POUR BTL) OPTIME
TOPICAL | Status: DC | PRN
  Administered 2012-05-30: 1000 mL

## 2012-05-30 MED ORDER — CELECOXIB 200 MG PO CAPS
200.0000 mg | ORAL_CAPSULE | Freq: Two times a day (BID) | ORAL | Status: DC
  Administered 2012-05-30 – 2012-06-01 (×4): 200 mg via ORAL
  Filled 2012-05-30 (×7): qty 1

## 2012-05-30 MED ORDER — MIDAZOLAM HCL 5 MG/5ML IJ SOLN
INTRAMUSCULAR | Status: DC | PRN
  Administered 2012-05-30: 2 mg via INTRAVENOUS

## 2012-05-30 MED ORDER — POTASSIUM CHLORIDE CRYS ER 20 MEQ PO TBCR
40.0000 meq | EXTENDED_RELEASE_TABLET | Freq: Two times a day (BID) | ORAL | Status: DC
  Administered 2012-05-30 – 2012-06-01 (×4): 40 meq via ORAL
  Filled 2012-05-30 (×7): qty 2

## 2012-05-30 MED ORDER — MONTELUKAST SODIUM 10 MG PO TABS
10.0000 mg | ORAL_TABLET | Freq: Every day | ORAL | Status: DC
  Administered 2012-05-30 – 2012-05-31 (×2): 10 mg via ORAL
  Filled 2012-05-30 (×4): qty 1

## 2012-05-30 MED ORDER — ARTIFICIAL TEARS OP OINT
TOPICAL_OINTMENT | OPHTHALMIC | Status: DC | PRN
  Administered 2012-05-30: 1 via OPHTHALMIC

## 2012-05-30 MED ORDER — SUCCINYLCHOLINE CHLORIDE 20 MG/ML IJ SOLN
INTRAMUSCULAR | Status: DC | PRN
  Administered 2012-05-30: 120 mg via INTRAVENOUS

## 2012-05-30 MED ORDER — HYDROMORPHONE HCL PF 1 MG/ML IJ SOLN
INTRAMUSCULAR | Status: AC
  Filled 2012-05-30: qty 1

## 2012-05-30 MED ORDER — ONDANSETRON HCL 4 MG/2ML IJ SOLN: 4.0000 mg | INTRAMUSCULAR | Status: DC | PRN

## 2012-05-30 MED ORDER — ALBUTEROL SULFATE HFA 108 (90 BASE) MCG/ACT IN AERS
2.0000 | INHALATION_SPRAY | Freq: Four times a day (QID) | RESPIRATORY_TRACT | Status: DC | PRN
  Filled 2012-05-30: qty 6.7

## 2012-05-30 MED ORDER — ATORVASTATIN CALCIUM 10 MG PO TABS
10.0000 mg | ORAL_TABLET | Freq: Every day | ORAL | Status: DC
  Administered 2012-05-30 – 2012-05-31 (×2): 10 mg via ORAL
  Filled 2012-05-30 (×4): qty 1

## 2012-05-30 MED ORDER — ACETAMINOPHEN 10 MG/ML IV SOLN
1000.0000 mg | Freq: Four times a day (QID) | INTRAVENOUS | Status: DC
  Administered 2012-05-30 – 2012-05-31 (×2): 1000 mg via INTRAVENOUS
  Filled 2012-05-30 (×4): qty 100

## 2012-05-30 MED ORDER — OXYCODONE HCL 5 MG/5ML PO SOLN: 5.0000 mg | Freq: Once | ORAL | Status: DC | PRN

## 2012-05-30 MED ORDER — OXYCODONE HCL 5 MG PO TABS: 5.0000 mg | ORAL_TABLET | Freq: Once | ORAL | Status: DC | PRN

## 2012-05-30 MED ORDER — CEFAZOLIN SODIUM 1-5 GM-% IV SOLN
1.0000 g | Freq: Three times a day (TID) | INTRAVENOUS | Status: AC
  Administered 2012-05-30 – 2012-05-31 (×2): 1 g via INTRAVENOUS
  Filled 2012-05-30 (×2): qty 50

## 2012-05-30 MED ORDER — SODIUM CHLORIDE 0.9 % IV SOLN
INTRAVENOUS | Status: AC
  Filled 2012-05-30: qty 500

## 2012-05-30 MED ORDER — PROPOFOL 10 MG/ML IV BOLUS
INTRAVENOUS | Status: DC | PRN
  Administered 2012-05-30: 200 mg via INTRAVENOUS

## 2012-05-30 MED ORDER — BUPIVACAINE HCL (PF) 0.25 % IJ SOLN
INTRAMUSCULAR | Status: DC | PRN
  Administered 2012-05-30: 20 mL

## 2012-05-30 MED ORDER — LIDOCAINE HCL 4 % MT SOLN
OROMUCOSAL | Status: DC | PRN
  Administered 2012-05-30: 4 mL via TOPICAL

## 2012-05-30 MED ORDER — FUROSEMIDE 80 MG PO TABS
80.0000 mg | ORAL_TABLET | Freq: Two times a day (BID) | ORAL | Status: DC
  Administered 2012-05-30 – 2012-06-01 (×4): 80 mg via ORAL
  Filled 2012-05-30 (×8): qty 1

## 2012-05-30 MED ORDER — EPHEDRINE SULFATE 50 MG/ML IJ SOLN
INTRAMUSCULAR | Status: DC | PRN
  Administered 2012-05-30 (×3): 10 mg via INTRAVENOUS
  Administered 2012-05-30 (×2): 5 mg via INTRAVENOUS
  Administered 2012-05-30: 10 mg via INTRAVENOUS

## 2012-05-30 MED ORDER — HYDROMORPHONE HCL PF 1 MG/ML IJ SOLN: 0.5000 mg | INTRAMUSCULAR | Status: DC | PRN

## 2012-05-30 MED ORDER — FLUTICASONE PROPIONATE 50 MCG/ACT NA SUSP
2.0000 | Freq: Every day | NASAL | Status: DC | PRN
  Filled 2012-05-30: qty 16

## 2012-05-30 MED ORDER — ACETAMINOPHEN 325 MG PO TABS: 650.0000 mg | ORAL_TABLET | ORAL | Status: DC | PRN

## 2012-05-30 MED ORDER — ALBUMIN HUMAN 5 % IV SOLN
INTRAVENOUS | Status: DC | PRN
  Administered 2012-05-30: 08:00:00 via INTRAVENOUS

## 2012-05-30 MED ORDER — ONDANSETRON HCL 4 MG/2ML IJ SOLN: 4.0000 mg | Freq: Four times a day (QID) | INTRAMUSCULAR | Status: DC | PRN

## 2012-05-30 MED ORDER — TAMSULOSIN HCL 0.4 MG PO CAPS
0.4000 mg | ORAL_CAPSULE | Freq: Every day | ORAL | Status: DC
  Administered 2012-05-31 – 2012-06-01 (×2): 0.4 mg via ORAL
  Filled 2012-05-30 (×3): qty 1

## 2012-05-30 MED ORDER — SPIRONOLACTONE 25 MG PO TABS
25.0000 mg | ORAL_TABLET | Freq: Every day | ORAL | Status: DC
  Administered 2012-05-30 – 2012-06-01 (×3): 25 mg via ORAL
  Filled 2012-05-30 (×4): qty 1

## 2012-05-30 SURGICAL SUPPLY — 68 items
BAG DECANTER FOR FLEXI CONT (MISCELLANEOUS) ×2 IMPLANT
BENZOIN TINCTURE PRP APPL 2/3 (GAUZE/BANDAGES/DRESSINGS) ×2 IMPLANT
BLADE SURG ROTATE 9660 (MISCELLANEOUS) IMPLANT
BONE MATRIX OSTEOCEL PRO LRG (Bone Implant) ×2 IMPLANT
BUR MATCHSTICK NEURO 3.0 LAGG (BURR) ×2 IMPLANT
CANISTER SUCTION 2500CC (MISCELLANEOUS) ×2 IMPLANT
CLIP NEUROVISION LG (CLIP) ×2 IMPLANT
CLOTH BEACON ORANGE TIMEOUT ST (SAFETY) ×2 IMPLANT
CONT SPEC 4OZ CLIKSEAL STRL BL (MISCELLANEOUS) ×4 IMPLANT
COROENT LO 10X10X25MM (Cage) ×2 IMPLANT
COVER BACK TABLE 24X17X13 BIG (DRAPES) IMPLANT
COVER TABLE BACK 60X90 (DRAPES) ×2 IMPLANT
DRAPE C-ARM 42X72 X-RAY (DRAPES) ×2 IMPLANT
DRAPE C-ARMOR (DRAPES) ×2 IMPLANT
DRAPE LAPAROTOMY 100X72X124 (DRAPES) ×2 IMPLANT
DRAPE POUCH INSTRU U-SHP 10X18 (DRAPES) ×2 IMPLANT
DRAPE SURG 17X23 STRL (DRAPES) ×2 IMPLANT
DRESSING TELFA 8X3 (GAUZE/BANDAGES/DRESSINGS) ×2 IMPLANT
DRSG OPSITE 4X5.5 SM (GAUZE/BANDAGES/DRESSINGS) ×2 IMPLANT
DURAPREP 26ML APPLICATOR (WOUND CARE) ×2 IMPLANT
ELECT BLADE 4.0 EZ CLEAN MEGAD (MISCELLANEOUS) ×2
ELECT REM PT RETURN 9FT ADLT (ELECTROSURGICAL) ×2
ELECTRODE BLDE 4.0 EZ CLN MEGD (MISCELLANEOUS) ×1 IMPLANT
ELECTRODE REM PT RTRN 9FT ADLT (ELECTROSURGICAL) ×1 IMPLANT
EVACUATOR 1/8 PVC DRAIN (DRAIN) ×2 IMPLANT
GAUZE SPONGE 4X4 16PLY XRAY LF (GAUZE/BANDAGES/DRESSINGS) IMPLANT
GLOVE BIO SURGEON STRL SZ 6.5 (GLOVE) ×4 IMPLANT
GLOVE BIO SURGEON STRL SZ8 (GLOVE) ×2 IMPLANT
GLOVE BIOGEL M 8.0 STRL (GLOVE) ×4 IMPLANT
GLOVE BIOGEL PI IND STRL 7.0 (GLOVE) ×4 IMPLANT
GLOVE BIOGEL PI IND STRL 8.5 (GLOVE) ×1 IMPLANT
GLOVE BIOGEL PI INDICATOR 7.0 (GLOVE) ×4
GLOVE BIOGEL PI INDICATOR 8.5 (GLOVE) ×1
GLOVE SS BIOGEL STRL SZ 6.5 (GLOVE) ×4 IMPLANT
GLOVE SUPERSENSE BIOGEL SZ 6.5 (GLOVE) ×4
GOWN BRE IMP SLV AUR LG STRL (GOWN DISPOSABLE) ×4 IMPLANT
GOWN BRE IMP SLV AUR XL STRL (GOWN DISPOSABLE) ×6 IMPLANT
GOWN STRL REIN 2XL LVL4 (GOWN DISPOSABLE) IMPLANT
HEMOSTAT POWDER KIT SURGIFOAM (HEMOSTASIS) IMPLANT
KIT BASIN OR (CUSTOM PROCEDURE TRAY) ×2 IMPLANT
KIT NEEDLE NVM5 EMG ELECT (KITS) ×1 IMPLANT
KIT NEEDLE NVM5 EMG ELECTRODE (KITS) ×1
KIT ROOM TURNOVER OR (KITS) ×2 IMPLANT
MILL MEDIUM DISP (BLADE) ×2 IMPLANT
NEEDLE HYPO 25X1 1.5 SAFETY (NEEDLE) ×2 IMPLANT
NS IRRIG 1000ML POUR BTL (IV SOLUTION) ×2 IMPLANT
PACK LAMINECTOMY NEURO (CUSTOM PROCEDURE TRAY) ×2 IMPLANT
PAD ARMBOARD 7.5X6 YLW CONV (MISCELLANEOUS) ×6 IMPLANT
ROD 35MM (Rod) ×4 IMPLANT
SCREW LOCK (Screw) ×4 IMPLANT
SCREW LOCK FXNS SPNE MAS PL (Screw) ×4 IMPLANT
SCREW PLIF MAS 5.5X35 LUMBAR (Screw) ×4 IMPLANT
SCREW SHANK 5.0X35 (Screw) ×4 IMPLANT
SCREW TULIP 5.5 (Screw) ×4 IMPLANT
SPONGE LAP 4X18 X RAY DECT (DISPOSABLE) IMPLANT
SPONGE SURGIFOAM ABS GEL 100 (HEMOSTASIS) ×2 IMPLANT
STRIP CLOSURE SKIN 1/2X4 (GAUZE/BANDAGES/DRESSINGS) ×2 IMPLANT
SUT VIC AB 0 CT1 18XCR BRD8 (SUTURE) ×1 IMPLANT
SUT VIC AB 0 CT1 8-18 (SUTURE) ×1
SUT VIC AB 2-0 CP2 18 (SUTURE) ×2 IMPLANT
SUT VIC AB 3-0 SH 8-18 (SUTURE) ×4 IMPLANT
SYR 20ML ECCENTRIC (SYRINGE) ×2 IMPLANT
TOWEL OR 17X24 6PK STRL BLUE (TOWEL DISPOSABLE) ×2 IMPLANT
TOWEL OR 17X26 10 PK STRL BLUE (TOWEL DISPOSABLE) ×2 IMPLANT
TRAP SPECIMEN MUCOUS 40CC (MISCELLANEOUS) ×2 IMPLANT
TRAY FOLEY CATH 14FRSI W/METER (CATHETERS) ×2 IMPLANT
TUBE CONNECTING 12X1/4 (SUCTIONS) ×2 IMPLANT
WATER STERILE IRR 1000ML POUR (IV SOLUTION) ×2 IMPLANT

## 2012-05-30 NOTE — Anesthesia Postprocedure Evaluation (Signed)
Anesthesia Post Note  Patient: Carlos Dawson  Procedure(s) Performed: Procedure(s) (LRB): POSTERIOR LUMBAR FUSION 1 LEVEL (N/A)  Anesthesia type: General  Patient location: PACU  Post pain: Pain level controlled and Adequate analgesia  Post assessment: Post-op Vital signs reviewed, Patient's Cardiovascular Status Stable, Respiratory Function Stable, Patent Airway and Pain level controlled  Last Vitals:  Filed Vitals:   05/30/12 1245  BP: 120/65  Pulse: 82  Temp:   Resp: 17    Post vital signs: Reviewed and stable  Level of consciousness: awake, alert  and oriented  Complications: No apparent anesthesia complications

## 2012-05-30 NOTE — Op Note (Signed)
05/30/2012  12:39 PM  PATIENT:  Carlos Dawson  72 y.o. 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 Decompressive lumbar laminectomy L4-5 requiring more work than would be required of the typical PLIF procedure in order to adequately decompress the neural elements. Both the L4 and L5 nerve roots were involved in stenosis and needed decompression in excess of simple laminectomy to expose the disc for fusion 2. transforaminal lumbar interbody fusion L4-5 using a PEEK interbody cage packed with morcellized allograft and autograft   3. Posterior fixation L4-5 using cortical pedicle screws.  4. Intertransverse arthrodesis L4-5 using morcellized autograft and allograft.  SURGEON:  Marikay Alar, MD  ASSISTANTS: Dr. Venetia Maxon  ANESTHESIA:  General  EBL: 500 ml  Total I/O In: 3120 [I.V.:2700; Blood:170; IV Piggyback:250] Out: 640 [Urine:140; Blood:500]  BLOOD ADMINISTERED:200 CC PRBC  DRAINS: Hemovac   INDICATION FOR PROCEDURE: This patient underwent previous decompressive laminectomy from L3-L5 in the remote past.. presented with recurrent back pain radiation into his left more than his right lower extremity. Myelogram showed instability at L4-5 as well as recurrent spinal stenosis. I recommended repeat decompression and instrumented fusion to address both the spinal stenosis and instability. Patient understood the risks, benefits, and alternatives and potential outcomes and wished to proceed.  PROCEDURE DETAILS:  The patient was brought to the operating room. After induction of generalized endotracheal anesthesia the patient was rolled into the prone position on chest rolls and all pressure points were padded. The patient's lumbar region was cleaned  and then prepped with DuraPrep and draped in the usual sterile fashion. Anesthesia was injected and then a dorsal midline incision was made and carried down to the lumbosacral fascia. The fascia was opened and the paraspinous musculature was taken down in a subperiosteal fashion to expose L4-5 and L3-4. He had a previous laminectomy and therefore there were no spinous processes. Identified the lamina and facet complexes at L3-4 and L4-5. Intraoperative fluoroscopy confirmed my level, and I started with placement L4 cortical pedicle screws. I localized the pedicle screw entry utilizing surface landmarks and AP and lateral fluoroscopy. I drilled in an upward and outward direction utilizing fluoroscopy and EMG monitoring. I then tapped with a 50 tap and then placed 50 by 35 mm pedicle screws at L4 bilaterally. I then checked these with AP lateral fluoroscopy. I then turned my attention to the repeat decompression and  complete lumbar laminectomies, hemi- facetectomies, and foraminotomies were performed at L4-5 bilaterally. Her was severe scarring from the previous surgeries. I took considerable time to dissect between scar tissue and the bone in order to perform a decompression. No time did I see a CSF leak . The yellow ligament was removed to expose the underlying dura and nerve roots, and generous foraminotomies were performed to adequately decompress the neural elements. 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 Therefore decided I would do TLIF from the patient's right side. Once the decompression was complete, I turned my attention to the posterior lower lumbar interbody fusion. The epidural venous vasculature was coagulated and cut sharply. Disc space was incised on the right and the initial discectomy was performed with pituitary rongeurs. The disc space was  distracted with sequential distractors to a height of 10 mm. We then used a series of  scrapers and shavers to prepare the endplates for fusion. The midline was prepared with Epstein curettes. Once the complete discectomy was finished, we packed an appropriate sized peek interbody cage with local autograft and morcellized allograft, gently retracted the nerve root, and tapped the cage into position at L4-5 from a transforaminal approach from the right. The midline was packed with morselized autograft and allograft. We then turned our attention to the posterior fixation At L5. The pedicle screw entry zones were identified utilizing surface landmarks and fluoroscopy. We probed each pedicle with the pedicle probe and tapped each pedicle with the appropriate tap. We palpated with a ball probe to assure no break in the cortex. We then placed 5 5 x 35 mm pedicle screws into the pedicles bilaterally at L5. We then decorticated the transverse processes and laid a mixture of morcellized autograft and allograft out over these to perform intertransverse arthrodesis at L4-5. We then placed lordotic rods into the multiaxial screw heads of the pedicle screws and locked these in position with the locking caps and anti-torque device.  We then checked our construct with AP and lateral fluoroscopy. Irrigated with copious amounts of bacitracin-containing saline solution. Placed a medium Hemovac drain through separate stab incision. Inspected the nerve roots once again to assure adequate decompression, lined to the dura with Gelfoam, and closed the muscle and the fascia with 0 Vicryl. Closed the subcutaneous tissues with 2-0 Vicryl and subcuticular tissues with 3-0 Vicryl. The skin was closed with benzoin and Steri-Strips. Dressing was then applied, the patient was awakened from general anesthesia and transported to the recovery room in stable condition. At the end of the procedure all sponge, needle and instrument counts were correct.   PLAN OF CARE: Admit to inpatient   PATIENT DISPOSITION:  PACU - hemodynamically  stable.   Delay start of Pharmacological VTE agent (>24hrs) due to surgical blood loss or risk of bleeding:  yes

## 2012-05-30 NOTE — Transfer of Care (Signed)
Immediate Anesthesia Transfer of Care Note  Patient: Carlos Dawson  Procedure(s) Performed: Procedure(s) with comments: POSTERIOR LUMBAR FUSION 1 LEVEL (N/A) - Lumbar four-five Maximum Access Surgery Transforaminal Lumbar Interbody Fusion  Patient Location: PACU  Anesthesia Type:General  Level of Consciousness: awake and alert   Airway & Oxygen Therapy: Patient Spontanous Breathing and Patient connected to face mask oxygen  Post-op Assessment: Report given to PACU RN and Post -op Vital signs reviewed and stable  Post vital signs: Reviewed and stable  Complications: No apparent anesthesia complications

## 2012-05-30 NOTE — Anesthesia Preprocedure Evaluation (Addendum)
Anesthesia Evaluation  Patient identified by MRN, date of birth, ID band Patient awake    Reviewed: Allergy & Precautions, H&P , NPO status , Patient's Chart, lab work & pertinent test results  Airway Mallampati: II  Neck ROM: full    Dental  (+) Teeth Intact and Dental Advisory Given   Pulmonary shortness of breath, asthma , sleep apnea , former smoker,          Cardiovascular hypertension, + CAD and + Peripheral Vascular Disease + dysrhythmias Atrial Fibrillation     Neuro/Psych  Headaches,    GI/Hepatic GERD-  ,  Endo/Other  Morbid obesity  Renal/GU      Musculoskeletal   Abdominal   Peds  Hematology   Anesthesia Other Findings   Reproductive/Obstetrics                          Anesthesia Physical Anesthesia Plan  ASA: III  Anesthesia Plan: General   Post-op Pain Management:    Induction: Intravenous  Airway Management Planned: Oral ETT  Additional Equipment:   Intra-op Plan:   Post-operative Plan: Extubation in OR  Informed Consent: I have reviewed the patients History and Physical, chart, labs and discussed the procedure including the risks, benefits and alternatives for the proposed anesthesia with the patient or authorized representative who has indicated his/her understanding and acceptance.     Plan Discussed with: CRNA, Anesthesiologist and Surgeon  Anesthesia Plan Comments:         Anesthesia Quick Evaluation

## 2012-05-30 NOTE — Anesthesia Procedure Notes (Signed)
Procedure Name: Intubation Date/Time: 05/30/2012 7:52 AM Performed by: Luster Landsberg Pre-anesthesia Checklist: Patient identified, Emergency Drugs available, Suction available and Patient being monitored Patient Re-evaluated:Patient Re-evaluated prior to inductionOxygen Delivery Method: Circle system utilized Preoxygenation: Pre-oxygenation with 100% oxygen Intubation Type: IV induction Grade View: Grade I Tube type: Oral Tube size: 7.5 mm Number of attempts: 1 Airway Equipment and Method: Stylet,  Video-laryngoscopy and LTA kit utilized Placement Confirmation: ETT inserted through vocal cords under direct vision,  positive ETCO2 and breath sounds checked- equal and bilateral Secured at: 23 cm Tube secured with: Tape Dental Injury: Teeth and Oropharynx as per pre-operative assessment  Difficulty Due To: Difficulty was anticipated, Difficult Airway- due to large tongue, Difficult Airway- due to reduced neck mobility and Difficult Airway- due to limited oral opening Comments: Anectine used r/t potential for difficult airway and for monitoring of pt soon after induction.

## 2012-05-30 NOTE — Progress Notes (Signed)
UR COMPLETED  

## 2012-05-30 NOTE — H&P (Signed)
Subjective: Patient is a 72 y.o. male admitted for PLIF L4-5 for stenosis/ instability. Has had multiple previous back surgeries. Onset of symptoms was many months ago, gradually worse since that time.  The pain is rated severe, and is located at the low back and radiates to legs. The pain is described as aching and occurs all day. The symptoms have been progressive. Symptoms are exacerbated by activity. MRI or CT showed stenosis with instability at L4-5.   Past Medical History  Diagnosis Date  . Hypertension   . Obesities, morbid   . Pulmonary embolism     2008  . Anticoagulant long-term use   . CAD (coronary artery disease)     s/p BMS to 2nd OM in Sept 2012  . Thoracic aortic aneurysm     followed by Dr. [name]; last CT November 11, 2010 and unchanged  . OA (osteoarthritis)   . LVH (left ventricular hypertrophy)   . Colonic polyp   . Mild intermittent asthma   . Hemorrhoids   . Generalized headaches   . SOB (shortness of breath)     using oxygen with exercise  . Diastolic dysfunction   . ASCVD (arteriosclerotic cardiovascular disease)     Prior BMS to the 2nd OM in September 2012; with repeat cath in October showing patency  . GERD (gastroesophageal reflux disease)   . IBS (irritable bowel syndrome)   . Aortic root enlargement   . Ascending aortic aneurysm     recent scan in October 2012 showing no change; followed by Dr. [name]  . PAF (paroxysmal atrial fibrillation)     treated with Coumadin  . Hearing loss   . Contact lens/glasses fitting   . OSA (obstructive sleep apnea)     PSG 03/30/97 AHI 21, BPAP 13/9  . Sleep apnea     Past Surgical History  Procedure Laterality Date  . Hemorroidectomy    . Vasectomy    . Cervical spine surgery  06/02/2010    lower back and neck  . Gastric bypass  05/27/2007  . Cholecystectomy    . Rotator cuff repair      both  . Achilles tendon repair    . Cardiac catheterization  2006  . Coronary stent placement  Sept 2012    2nd OM  with BMS  . Cardiac catheterization  October 2012    Stent patent  . Appendectomy    . Eye surgery      bilateral cataract    Prior to Admission medications   Medication Sig Start Date End Date Taking? Authorizing Provider  albuterol (PROVENTIL HFA;VENTOLIN HFA) 108 (90 BASE) MCG/ACT inhaler Inhale 2 puffs into the lungs every 6 (six) hours as needed for wheezing.   Yes Historical Provider, MD  aspirin EC 81 MG tablet Take 81 mg by mouth daily.   Yes Historical Provider, MD  atenolol (TENORMIN) 25 MG tablet Take 25 mg by mouth daily.   Yes Historical Provider, MD  cetirizine (ZYRTEC) 10 MG tablet Take 10 mg by mouth daily.   Yes Historical Provider, MD  Cholecalciferol (VITAMIN D) 2000 UNITS CAPS Take by mouth 2 (two) times daily.   Yes Historical Provider, MD  finasteride (PROSCAR) 5 MG tablet Take 5 mg by mouth daily.   Yes Historical Provider, MD  fluticasone (FLONASE) 50 MCG/ACT nasal spray Place 2 sprays into the nose daily as needed for rhinitis.   Yes Historical Provider, MD  furosemide (LASIX) 80 MG tablet Take 80 mg by mouth 2 (  two) times daily.   Yes Historical Provider, MD  metFORMIN (GLUCOPHAGE) 500 MG tablet Take 250 mg by mouth 2 (two) times daily.   Yes Historical Provider, MD  montelukast (SINGULAIR) 10 MG tablet Take 10 mg by mouth at bedtime.   Yes Historical Provider, MD  nitroGLYCERIN (NITROSTAT) 0.4 MG SL tablet Place 0.4 mg under the tongue every 5 (five) minutes as needed for chest pain.   Yes Historical Provider, MD  pantoprazole (PROTONIX) 40 MG tablet Take 40 mg by mouth daily.   Yes Historical Provider, MD  potassium chloride SA (K-DUR,KLOR-CON) 20 MEQ tablet Take 40 mEq by mouth 2 (two) times daily.   Yes Historical Provider, MD  ranolazine (RANEXA) 500 MG 12 hr tablet Take 500 mg by mouth 2 (two) times daily.   Yes Historical Provider, MD  rosuvastatin (CRESTOR) 10 MG tablet Take 10 mg by mouth daily.   Yes Historical Provider, MD  spironolactone (ALDACTONE) 25 MG  tablet Take 25 mg by mouth daily.   Yes Historical Provider, MD  tamsulosin (FLOMAX) 0.4 MG CAPS Take 0.4 mg by mouth daily.   Yes Historical Provider, MD  traMADol (ULTRAM) 50 MG tablet Take 50 mg by mouth every 6 (six) hours as needed for pain.   Yes Historical Provider, MD  valsartan (DIOVAN) 160 MG tablet Take 160 mg by mouth daily.   Yes Historical Provider, MD  warfarin (COUMADIN) 5 MG tablet Take 2.5-5 mg by mouth daily. Takes 2.5mg  every Monday and Friday.  Takes 5mg  on all remaining days.   curretnly on hold   Yes Historical Provider, MD   Allergies  Allergen Reactions  . Morphine Itching  . Adhesive (Tape) Itching and Rash    Where applied.    History  Substance Use Topics  . Smoking status: Former Smoker -- 1.50 packs/day for 30 years    Quit date: 01/24/1992  . Smokeless tobacco: Never Used     Comment: Stopped in 1983  . Alcohol Use: No    Family History  Problem Relation Age of Onset  . Heart disease Mother   . Diabetes Mother   . Other Mother     stent placement  . Emphysema Father 55  . Heart attack Sister      Review of Systems  Positive ROS: neg  All other systems have been reviewed and were otherwise negative with the exception of those mentioned in the HPI and as above.  Objective: Vital signs in last 24 hours: Temp:  [97.3 F (36.3 C)] 97.3 F (36.3 C) (05/08 0605) Pulse Rate:  [61] 61 (05/08 0605) Resp:  [18] 18 (05/08 0605) BP: (108)/(69) 108/69 mmHg (05/08 0605) SpO2:  [96 %] 96 % (05/08 0605)  General Appearance: Alert, cooperative, no distress, appears stated age Head: Normocephalic, without obvious abnormality, atraumatic Eyes: PERRL, conjunctiva/corneas clear, EOM's intact     Neck: Supple, symmetrical, trachea midline Back: Symmetric, well healed  scars Lungs: respirations unlabored Heart: reg rhythm Abdomen: Soft, non-tender Extremities: Extremities normal, atraumatic, no cyanosis or edema Pulses: 2+ and symmetric all  extremities Skin: Skin color, texture, turgor normal, no rashes or lesions  NEUROLOGIC:   Mental status: Alert and oriented x4,  no aphasia, good attention span, fund of knowledge, and memory Motor Exam - grossly normal Sensory Exam - grossly normal Reflexes: 1+ Coordination - grossly normal Gait - not tested Balance - grossly normal Cranial Nerves: I: smell Not tested  II: visual acuity  OS: nl    OD: nl  II: visual fields Full to confrontation  II: pupils Equal, round, reactive to light  III,VII: ptosis None  III,IV,VI: extraocular muscles  Full ROM  V: mastication Normal  V: facial light touch sensation  Normal  V,VII: corneal reflex  Present  VII: facial muscle function - upper  Normal  VII: facial muscle function - lower Normal  VIII: hearing Not tested  IX: soft palate elevation  Normal  IX,X: gag reflex Present  XI: trapezius strength  5/5  XI: sternocleidomastoid strength 5/5  XI: neck flexion strength  5/5  XII: tongue strength  Normal    Data Review Lab Results  Component Value Date   WBC 6.7 05/21/2012   HGB 13.5 05/21/2012   HCT 39.8 05/21/2012   MCV 87.3 05/21/2012   PLT 137* 05/21/2012   Lab Results  Component Value Date   NA 138 05/21/2012   K 3.9 05/21/2012   CL 102 05/21/2012   CO2 27 05/21/2012   BUN 18 05/21/2012   CREATININE 1.07 05/21/2012   GLUCOSE 270* 05/21/2012   Lab Results  Component Value Date   INR 2.01* 05/21/2012    Assessment/Plan: Patient admitted for PLIF L4-5. Patient has failed conservative therapy. Plan early mobilization and resume anti-coagulation because of history of PE.   I explained the condition and procedure to the patient and answered any questions.  Patient wishes to proceed with procedure as planned. Understands risks/ benefits and typical outcomes of procedure.   Omarion Minnehan S 05/30/2012 6:28 AM

## 2012-05-31 LAB — GLUCOSE, CAPILLARY
Glucose-Capillary: 145 mg/dL — ABNORMAL HIGH (ref 70–99)
Glucose-Capillary: 181 mg/dL — ABNORMAL HIGH (ref 70–99)
Glucose-Capillary: 179 mg/dL — ABNORMAL HIGH (ref 70–99)
Glucose-Capillary: 154 mg/dL — ABNORMAL HIGH (ref 70–99)
Glucose-Capillary: 144 mg/dL — ABNORMAL HIGH (ref 70–99)

## 2012-05-31 MED ORDER — OXYCODONE HCL 5 MG PO TABS
5.0000 mg | ORAL_TABLET | ORAL | Status: DC | PRN
  Administered 2012-05-31: 10 mg via ORAL
  Administered 2012-06-01: 5 mg via ORAL
  Filled 2012-05-31: qty 2
  Filled 2012-05-31: qty 1

## 2012-05-31 MED FILL — Hydromorphone HCl Inj 1 MG/ML: INTRAMUSCULAR | Qty: 1 | Status: AC

## 2012-05-31 NOTE — Evaluation (Signed)
Occupational Therapy Evaluation Patient Details Name: Carlos Dawson MRN: [id number] DOB: 09-06-1940 Today's Date: 05/31/2012 Time: 8295-6213 OT Time Calculation (min): 35 min  OT Assessment / Plan / Recommendation Clinical Impression  Pt s/p posterior lumbar fusion 1 level.  Pt is overall supervision level with ADLs and functional mobility. Education completed. No further acute OT needs.    OT Assessment  Patient does not need any further OT services    Follow Up Recommendations  No OT follow up;Supervision/Assistance - 24 hour    Barriers to Discharge      Equipment Recommendations  None recommended by OT    Recommendations for Other Services    Frequency       Precautions / Restrictions Precautions Precautions: Back Precaution Comments: Educated pt and wife on back precautions. Required Braces or Orthoses: Spinal Brace Spinal Brace: Lumbar corset   Pertinent Vitals/Pain See vitals    ADL  Eating/Feeding: Performed;Independent Where Assessed - Eating/Feeding: Chair Upper Body Bathing: Simulated;Supervision/safety Where Assessed - Upper Body Bathing: Unsupported sitting Lower Body Bathing: Simulated;Supervision/safety Where Assessed - Lower Body Bathing: Unsupported sit to stand Upper Body Dressing: Performed;Supervision/safety Where Assessed - Upper Body Dressing: Unsupported sitting Lower Body Dressing: Performed;Supervision/safety Where Assessed - Lower Body Dressing: Unsupported sit to stand Toilet Transfer: Simulated;Supervision/safety Toilet Transfer Method: Sit to Barista:  (bed, ambulated in hallway, returned to chair) Toileting - Architect and Hygiene: Simulated;Supervision/safety Where Assessed - Engineer, mining and Hygiene: Standing Equipment Used: Back brace Transfers/Ambulation Related to ADLs: supervision ambulating in hallway ADL Comments: Pt and wife are familiar with AE as wife has had multiple  back sx.  Pt reports they have several reachers but no longer have a sock aid.  Pt also concerned about how to perform toileting hygiene after a BM.  Educated pt and wife on use of toilet aid (curved handle).  Educated pt and wife on acquisition and pricing of AE.  Wife plans to purchase AE from hospital gift shop.  Pt sitting EOB on OT arrival but able to verbalize log roll technique for bed mobility.      OT Diagnosis:    OT Problem List:   OT Treatment Interventions:     OT Goals    Visit Information  Last OT Received On: 05/31/12 Assistance Needed: +1    Subjective Data  Subjective: "I feel pretty good."   Prior Functioning     Home Living Lives With: Spouse Available Help at Discharge: Family Type of Home: House (townhouse) Home Access: Stairs to enter Secretary/administrator of Steps: 2 Home Layout: Two level Alternate Level Stairs-Number of Steps: has Engineer, maintenance (IT) Shower/Tub: Health visitor: Standard Home Adaptive Equipment: Reacher;Shower chair with back;Raised toilet seat with rails Prior Function Level of Independence: Independent Communication Communication: No difficulties Dominant Hand: Right         Vision/Perception     Cognition  Cognition Arousal/Alertness: Awake/alert Behavior During Therapy: WFL for tasks assessed/performed Overall Cognitive Status: Within Functional Limits for tasks assessed    Extremity/Trunk Assessment Right Upper Extremity Assessment RUE ROM/Strength/Tone: WFL for tasks assessed Left Upper Extremity Assessment LUE ROM/Strength/Tone: WFL for tasks assessed     Mobility Bed Mobility Bed Mobility: Not assessed (pt sitting EOB) Transfers Transfers: Sit to Stand;Stand to Sit Sit to Stand: 5: Supervision;From bed Stand to Sit: 5: Supervision;To chair/3-in-1 Details for Transfer Assistance: demo'd good safety awareness     Exercise     Balance     End of Session  OT - End of Session Equipment  Utilized During Treatment: Back brace Activity Tolerance: Patient tolerated treatment well Patient left: in chair;with call bell/phone within reach;with family/visitor present Nurse Communication: Mobility status  GO    05/31/2012 Cipriano Mile OTR/L Pager [phone] Office [phone]  Cipriano Mile 05/31/2012, 10:33 AM

## 2012-05-31 NOTE — Progress Notes (Signed)
   CARE MANAGEMENT NOTE 05/31/2012  Patient:  Carlos Dawson, Carlos Dawson   Account Number:  [id number]  Date Initiated:  05/31/2012  Documentation initiated by:  [facility]  Subjective/Objective Assessment:   POSTERIOR LUMBAR FUSION 1 LEVEL     Action/Plan:   lives at home with wife   Anticipated DC Date:  05/31/2012   Anticipated DC Plan:  HOME/SELF CARE      DC Planning Services  CM consult      Choice offered to / List presented to:             Status of service:  Completed, signed off Medicare Important Message given?   (If response is "NO", the following Medicare IM given date fields will be blank) Date Medicare IM given:   Date Additional Medicare IM given:    Discharge Disposition:  HOME/SELF CARE  Per UR Regulation:    If discussed at Long Length of Stay Meetings, dates discussed:    Comments:  05/31/2012 1500 NCM spoke to pt and has RW, cane and reacher at home. No PT/OT needs identified. Isidoro Donning RN CCM Case Mgmt phone [phone]

## 2012-05-31 NOTE — Progress Notes (Signed)
Patient ID: Carlos Dawson, male   DOB: 03/18/1940, 72 y.o.   MRN: [id number] Subjective: Patient reports appropriate back soreness, NO LEG PAIN or N/T/W.  Objective: Vital signs in last 24 hours: Temp:  [97.4 F (36.3 C)-99 F (37.2 C)] 97.8 F (36.6 C) (05/09 0600) Pulse Rate:  [57-109] 109 (05/09 0600) Resp:  [17-20] 18 (05/09 0600) BP: (92-138)/(42-105) 112/60 mmHg (05/09 0600) SpO2:  [96 %-100 %] 99 % (05/09 0600) Weight:  [134.5 kg (296 lb 8.3 oz)] 134.5 kg (296 lb 8.3 oz) (05/09 0100)  Intake/Output from previous day: 05/08 0701 - 05/09 0700 In: 3520 [I.V.:3000; Blood:170; IV Piggyback:250] Out: 2600 [Urine:1920; Drains:180; Blood:500] Intake/Output this shift:    Neurologic: Grossly normal  Lab Results: Lab Results  Component Value Date   WBC 6.7 05/21/2012   HGB 13.5 05/21/2012   HCT 39.8 05/21/2012   MCV 87.3 05/21/2012   PLT 137* 05/21/2012   Lab Results  Component Value Date   INR 1.24 05/30/2012   BMET Lab Results  Component Value Date   NA 138 05/21/2012   K 3.9 05/21/2012   CL 102 05/21/2012   CO2 27 05/21/2012   GLUCOSE 270* 05/21/2012   BUN 18 05/21/2012   CREATININE 1.07 05/21/2012   CALCIUM 9.2 05/21/2012    Studies/Results: Dg Lumbar Spine 2-3 Views  05/30/2012  *RADIOLOGY REPORT*  Clinical Data: L4-L5 fusion.  DG C-ARM GT 120 MIN,LUMBAR SPINE - 2-3 VIEW  Technique:  C-arm fluoroscopic images were obtained intraoperatively and submitted for postoperative interpretation. Please see the performing provider's procedural report for the fluoroscopy time utilized.  Comparison:  03/27/2012  Findings: Two intraoperative images were submitted.  Posterior fixation of L4-L5 with pedicle screws and rods.  Interbody fusion at L4-L5.  IMPRESSION: L4-L5 fusion.   Original Report Authenticated By: Richarda Overlie, M.D.    Dg C-arm Gt 120 Min  05/30/2012  *RADIOLOGY REPORT*  Clinical Data: L4-L5 fusion.  DG C-ARM GT 120 MIN,LUMBAR SPINE - 2-3 VIEW  Technique:  C-arm fluoroscopic  images were obtained intraoperatively and submitted for postoperative interpretation. Please see the performing provider's procedural report for the fluoroscopy time utilized.  Comparison:  03/27/2012  Findings: Two intraoperative images were submitted.  Posterior fixation of L4-L5 with pedicle screws and rods.  Interbody fusion at L4-L5.  IMPRESSION: L4-L5 fusion.   Original Report Authenticated By: Richarda Overlie, M.D.     Assessment/Plan: Doing great, home tomorrow   LOS: 1 day    Adreana Coull S 05/31/2012, 7:40 AM

## 2012-05-31 NOTE — Evaluation (Signed)
Agree with evaluation and plan for d/c from PT.  05/31/2012 Veda Canning, PT Pager: [phone]

## 2012-05-31 NOTE — Evaluation (Signed)
Physical Therapy Evaluation Patient Details Name: Carlos Dawson MRN: [id number] DOB: November 29, 1940 Today's Date: 05/31/2012 Time: 4098-1191 PT Time Calculation (min): 19 min  PT Assessment / Plan / Recommendation Clinical Impression  Pt admitted s/p Lumbar fusion. Pt able to perform transfers and ambulation demonstrating proper back precautions. With no assistive device, pt able to demonstrate balance WFL during dynamic gait activities. Educated on back precautions, timing of position changes, and ADLs. No further Acute PT recommended.     PT Assessment  Patent does not need any further PT services    Follow Up Recommendations  No PT follow up       Precautions / Restrictions Precautions Precautions: Back Precaution Booklet Issued: Yes (comment) Precaution Comments: Pt able to recall 3/3 precautions without cues. Required Braces or Orthoses: Spinal Brace Spinal Brace: Lumbar corset Restrictions Weight Bearing Restrictions: No   Pertinent Vitals/Pain 2/10 pain at rest localized to surgery site.  Slight increase with prolonged ambulation.      Mobility  Bed Mobility Bed Mobility: Not assessed (pt seated when entering room) Transfers Transfers: Sit to Stand;Stand to Sit Sit to Stand: 6: Modified independent (Device/Increase time);From chair/3-in-1 Stand to Sit: 6: Modified independent (Device/Increase time);To chair/3-in-1 Details for Transfer Assistance: Pt followed all back precautions appropriately and demonstrated strength and balance WFL.  Ambulation/Gait Ambulation/Gait Assistance: 7: Independent Ambulation Distance (Feet): 600 Feet Assistive device: None Ambulation/Gait Assistance Details: Pt complaining of mild shortness of breath at the end of ambulation. Gait Pattern: Decreased stance time - right Gait velocity: WFL General Gait Details: Otherwise WFL Stairs: Yes (Modified independent) Stair Management Technique: Two rails;Forwards;Step to pattern Number of  Stairs: 5       Visit Information  Last PT Received On: 05/31/12 Assistance Needed: +1    Subjective Data  Subjective: "I'm feeling pretty good" Patient Stated Goal: Go home   Prior Functioning  Home Living Lives With: Spouse Available Help at Discharge: Family Type of Home: House Home Access: Stairs to enter Secretary/administrator of Steps: 2 Entrance Stairs-Rails: Can reach both Home Layout: Two level Alternate Level Stairs-Number of Steps: has Engineer, maintenance (IT) Shower/Tub: Health visitor: Standard Home Adaptive Equipment: Reacher;Shower chair with back;Raised toilet seat with rails Prior Function Level of Independence: Independent Able to Take Stairs?: Yes Communication Communication: No difficulties Dominant Hand: Right    Cognition  Cognition Arousal/Alertness: Awake/alert Behavior During Therapy: WFL for tasks assessed/performed Overall Cognitive Status: Within Functional Limits for tasks assessed    Extremity/Trunk Assessment Right Upper Extremity Assessment RUE ROM/Strength/Tone: [facility] for tasks assessed Left Upper Extremity Assessment LUE ROM/Strength/Tone: WFL for tasks assessed Right Lower Extremity Assessment RLE ROM/Strength/Tone: [facility] for tasks assessed Left Lower Extremity Assessment LLE ROM/Strength/Tone: WFL for tasks assessed   Balance Balance Balance Assessed: Yes Standardized Balance Assessment Standardized Balance Assessment: Dynamic Gait Index Dynamic Gait Index Level Surface: Normal Change in Gait Speed: Normal Gait with Horizontal Head Turns: Normal Gait with Vertical Head Turns: Normal Gait and Pivot Turn: Mild Impairment Step Over Obstacle: Normal Step Around Obstacles: Normal Steps: Moderate Impairment Total Score: 21  End of Session PT - End of Session Equipment Utilized During Treatment: Gait belt;Back brace Activity Tolerance: Patient tolerated treatment well Patient left: in chair;with call bell/phone within  reach;with family/visitor present (educated on limited sitting time s/p surgery) Nurse Communication: Mobility status  GP     Payton Doughty 05/31/2012, 11:48 AM Payton Doughty, PT Student

## 2012-06-01 LAB — GLUCOSE, CAPILLARY: Glucose-Capillary: 97 mg/dL (ref 70–99)

## 2012-06-01 MED ORDER — OXYCODONE HCL 5 MG PO TABS: 5.0000 mg | ORAL_TABLET | Freq: Four times a day (QID) | ORAL | Status: DC | PRN

## 2012-06-01 NOTE — Discharge Summary (Signed)
Physician Discharge Summary  Patient ID: Carlos Dawson MRN: [id number] DOB/AGE: [birth year] 71 y.o.  Admit date: 05/30/2012 Discharge date: 06/01/2012  Admission Diagnoses:Spondylolisthesis with instability and recurrent stenosis L4-5 with compression of both L4 and the L5 nerve roots bilaterally, back and leg pain   Discharge Diagnoses: Spondylolisthesis with instability and recurrent stenosis L4-5 with compression of both L4 and the L5 nerve roots bilaterally, back and leg pain  Active Problems:   * No active hospital problems. *   Discharged Condition: good  Hospital Course: Carlos Dawson was admitted to the hospital and underwent 1. Decompressive lumbar laminectomy L4-5 requiring more work than would be required of the typical PLIF procedure in order to adequately decompress the neural elements. Both the L4 and L5 nerve roots were involved in stenosis and needed decompression in excess of simple laminectomy to expose the disc for fusion 2. transforaminal lumbar interbody fusion L4-5 using a PEEK interbody cage packed with morcellized allograft and autograft    3. Posterior fixation L4-5 using cortical pedicle screws.   4. Intertransverse arthrodesis L4-5 using morcellized autograft and allograft. Post op he has had no significant problems. Only complains of incisional pain. Moving lower extremities well. Wound is clean, dry, and without signs of infection. He is ambulating and voiding without difficulty.   Consults: None  Significant Diagnostic Studies: none  Treatments: surgery: 1. Decompressive lumbar laminectomy L4-5 requiring more work than would be required of the typical PLIF procedure in order to adequately decompress the neural elements. Both the L4 and L5 nerve roots were involved in stenosis and needed decompression in excess of simple laminectomy to expose the disc for fusion 2. transforaminal lumbar interbody fusion L4-5 using a PEEK interbody cage packed with morcellized  allograft and autograft    3. Posterior fixation L4-5 using cortical pedicle screws.   4. Intertransverse arthrodesis L4-5 using morcellized autograft and allograft.   Discharge Exam: Blood pressure 123/68, pulse 106, temperature 97.5 F (36.4 C), temperature source Oral, resp. rate 18, height 5\' 7"  (1.702 m), weight 134.5 kg (296 lb 8.3 oz), SpO2 95.00%. General appearance: alert, cooperative, appears stated age and no distress Neurologic: Alert and oriented X 3, normal strength and tone. Normal symmetric reflexes. Normal coordination and gait  Disposition: 01-Home or Self Care   Future Appointments Provider Department Dept Phone   07/19/2012 8:45 AM [name] [name], MD [facility] Main Office [place]) [phone]       Medication List    TAKE these medications       albuterol 108 (90 BASE) MCG/ACT inhaler  Commonly known as:  PROVENTIL HFA;VENTOLIN HFA  Inhale 2 puffs into the lungs every 6 (six) hours as needed for wheezing.     aspirin EC 81 MG tablet  Take 81 mg by mouth daily.     atenolol 25 MG tablet  Commonly known as:  TENORMIN  Take 25 mg by mouth daily.     cetirizine 10 MG tablet  Commonly known as:  ZYRTEC  Take 10 mg by mouth daily.     finasteride 5 MG tablet  Commonly known as:  PROSCAR  Take 5 mg by mouth daily.     fluticasone 50 MCG/ACT nasal spray  Commonly known as:  FLONASE  Place 2 sprays into the nose daily as needed for rhinitis.     furosemide 80 MG tablet  Commonly known as:  LASIX  Take 80 mg by mouth 2 (two) times daily.     metFORMIN 500 MG  tablet  Commonly known as:  GLUCOPHAGE  Take 250 mg by mouth 2 (two) times daily.     montelukast 10 MG tablet  Commonly known as:  SINGULAIR  Take 10 mg by mouth at bedtime.     nitroGLYCERIN 0.4 MG SL tablet  Commonly known as:  NITROSTAT  Place 0.4 mg under the tongue every 5 (five) minutes as needed for chest pain.     oxyCODONE 5 MG immediate release tablet  Commonly known as:   Oxy IR/ROXICODONE  Take 1-2 tablets (5-10 mg total) by mouth every 6 (six) hours as needed for pain.     pantoprazole 40 MG tablet  Commonly known as:  PROTONIX  Take 40 mg by mouth daily.     potassium chloride SA 20 MEQ tablet  Commonly known as:  K-DUR,KLOR-CON  Take 40 mEq by mouth 2 (two) times daily.     ranolazine 500 MG 12 hr tablet  Commonly known as:  RANEXA  Take 500 mg by mouth 2 (two) times daily.     rosuvastatin 10 MG tablet  Commonly known as:  CRESTOR  Take 10 mg by mouth daily.     spironolactone 25 MG tablet  Commonly known as:  ALDACTONE  Take 25 mg by mouth daily.     tamsulosin 0.4 MG Caps  Commonly known as:  FLOMAX  Take 0.4 mg by mouth daily.     traMADol 50 MG tablet  Commonly known as:  ULTRAM  Take 50 mg by mouth every 6 (six) hours as needed for pain.     valsartan 160 MG tablet  Commonly known as:  DIOVAN  Take 160 mg by mouth daily.     Vitamin D 2000 UNITS Caps  Take by mouth 2 (two) times daily.     warfarin 5 MG tablet  Commonly known as:  COUMADIN  Take 2.5-5 mg by mouth daily. Takes 2.5mg  every Monday and Friday.  Takes 5mg  on all remaining days.      curretnly on hold         Signed: Nadeem Romanoski L 06/01/2012, 9:11 AM

## 2012-06-03 MED FILL — Sodium Chloride IV Soln 0.9%: INTRAVENOUS | Qty: 2000 | Status: AC

## 2012-06-03 MED FILL — Heparin Sodium (Porcine) Inj 1000 Unit/ML: INTRAMUSCULAR | Qty: 30 | Status: AC

## 2012-06-06 ENCOUNTER — Encounter (INDEPENDENT_AMBULATORY_CARE_PROVIDER_SITE_OTHER): Payer: 59 | Admitting: Surgery

## 2012-06-06 ENCOUNTER — Telehealth: Payer: Self-pay

## 2012-06-06 MED ORDER — RIVAROXABAN 20 MG PO TABS: 20.0000 mg | ORAL_TABLET | Freq: Every day | ORAL | Status: DC

## 2012-06-06 NOTE — Telephone Encounter (Signed)
Spoke to patient he stated he is on xarelto 20 mg daily and needs a refill.Refill sent to pharmacy.

## 2012-06-16 ENCOUNTER — Other Ambulatory Visit: Payer: Self-pay | Admitting: Cardiology

## 2012-06-18 NOTE — Telephone Encounter (Signed)
pantoprazole (PROTONIX) 40 MG tablet  Take 1 tablet (40 mg total) by mouth daily.   90 tablet   3   Patient Instructions  Reduce your Diovan to 80 mg daily Reduce Lasix to 80 mg twice a day. Watch closely for increased swelling or weight gain. You are cleared for your back surgery. Stop coumadin 5 days before. I would consider switching you to Xarelto post op. Chart Reviewed By  Charna Elizabeth, LPN  on 1/61/0960  1:35 PM  Previous Visit  Provider Department Encounter #  04/05/2012  8:15 AM Peter [name], MD [facility] [id number]

## 2012-06-28 ENCOUNTER — Other Ambulatory Visit: Payer: Self-pay | Admitting: Cardiology

## 2012-07-04 ENCOUNTER — Other Ambulatory Visit: Payer: Self-pay | Admitting: Cardiology

## 2012-07-17 ENCOUNTER — Other Ambulatory Visit: Payer: Self-pay | Admitting: Nurse Practitioner

## 2012-07-19 ENCOUNTER — Encounter: Payer: Self-pay | Admitting: Cardiology

## 2012-07-19 ENCOUNTER — Ambulatory Visit (INDEPENDENT_AMBULATORY_CARE_PROVIDER_SITE_OTHER): Payer: 59 | Admitting: Cardiology

## 2012-07-19 VITALS — BP 112/78 | HR 68 | Ht 67.0 in | Wt 284.0 lb

## 2012-07-19 DIAGNOSIS — I2699 Other pulmonary embolism without acute cor pulmonale: Secondary | ICD-10-CM | POA: Diagnosis not present

## 2012-07-19 DIAGNOSIS — I4891 Unspecified atrial fibrillation: Secondary | ICD-10-CM

## 2012-07-19 DIAGNOSIS — I480 Paroxysmal atrial fibrillation: Secondary | ICD-10-CM

## 2012-07-19 DIAGNOSIS — Z7901 Long term (current) use of anticoagulants: Secondary | ICD-10-CM

## 2012-07-19 DIAGNOSIS — I2510 Atherosclerotic heart disease of native coronary artery without angina pectoris: Secondary | ICD-10-CM | POA: Diagnosis not present

## 2012-07-19 NOTE — Progress Notes (Signed)
Carlos Dawson Date of Birth: [birth year] Medical Record #[id number]  History of Present Illness: Carlos Dawson is seen today for followup. He has multiple medical issues including CAD, thoracic aneurysm, pulmonary emboli, morbid obesity with prior lap banding, HTN, chronic edema and PAF. He he is on coumadin. He underwent stenting of the obtuse marginal vessel in September of 2012. His episode of pulmonary embolus was following an Achilles tendon repair when he was immobilized for a long time. He did have followup venous Dopplers this past summer which were normal. He had extensive lumbar surgery last month and this went very well. He has no significant pain now. The patient is still on BiPAP for his sleep apnea. He denies any significant shortness of breath or chest pain. He's had no increase in edema. His blood pressure has been under good control even since we reduced his Diovan dose. He has had no increase in edema since we reduced his Lasix dose. He does note easy bruisability.  Current Outpatient Prescriptions on File Prior to Visit  Medication Sig Dispense Refill  . albuterol (PROVENTIL HFA;VENTOLIN HFA) 108 (90 BASE) MCG/ACT inhaler Inhale 2 puffs into the lungs every 6 (six) hours as needed for wheezing.      Marland Kitchen atenolol (TENORMIN) 25 MG tablet Take 25 mg by mouth daily.      . cetirizine (ZYRTEC) 10 MG tablet Take 10 mg by mouth daily.      . Cholecalciferol (VITAMIN D) 2000 UNITS CAPS Take by mouth 2 (two) times daily.      . finasteride (PROSCAR) 5 MG tablet Take 5 mg by mouth daily.      . fluticasone (FLONASE) 50 MCG/ACT nasal spray Place 2 sprays into the nose daily as needed for rhinitis.      . furosemide (LASIX) 80 MG tablet Take 80 mg by mouth 2 (two) times daily.      Marland Kitchen KLOR-CON M20 20 MEQ tablet TAKE 2 TABLETS BY MOUTH TWICE A DAY  120 tablet  5  . montelukast (SINGULAIR) 10 MG tablet Take 10 mg by mouth at bedtime.      . nitroGLYCERIN (NITROSTAT) 0.4 MG SL tablet Place 0.4 mg under  the tongue every 5 (five) minutes as needed for chest pain.      Marland Kitchen oxyCODONE (OXY IR/ROXICODONE) 5 MG immediate release tablet Take 1-2 tablets (5-10 mg total) by mouth every 6 (six) hours as needed for pain.  70 tablet  0  . pantoprazole (PROTONIX) 40 MG tablet TAKE 1 TABLET EVERY DAY  30 tablet  6  . ranolazine (RANEXA) 500 MG 12 hr tablet Take 500 mg by mouth 2 (two) times daily.      . Rivaroxaban (XARELTO) 20 MG TABS Take 1 tablet (20 mg total) by mouth daily.  90 tablet  3  . rosuvastatin (CRESTOR) 10 MG tablet Take 10 mg by mouth daily.      Marland Kitchen spironolactone (ALDACTONE) 25 MG tablet Take 25 mg by mouth daily.      . tamsulosin (FLOMAX) 0.4 MG CAPS Take 0.4 mg by mouth daily.      . traMADol (ULTRAM) 50 MG tablet Take 50 mg by mouth every 6 (six) hours as needed for pain.      . valsartan (DIOVAN) 160 MG tablet Take 160 mg by mouth daily.       No current facility-administered medications on file prior to visit.    Allergies  Allergen Reactions  . Morphine Itching  . Adhesive (Tape)  Itching and Rash    Where applied.    Past Medical History  Diagnosis Date  . Hypertension   . Obesities, morbid   . Pulmonary embolism     2008  . Anticoagulant long-term use   . CAD (coronary artery disease)     s/p BMS to 2nd OM in Sept 2012  . Thoracic aortic aneurysm     followed by Dr. [name]; last CT November 11, 2010 and unchanged  . OA (osteoarthritis)   . LVH (left ventricular hypertrophy)   . Colonic polyp   . Mild intermittent asthma   . Hemorrhoids   . Generalized headaches   . SOB (shortness of breath)     using oxygen with exercise  . Diastolic dysfunction   . ASCVD (arteriosclerotic cardiovascular disease)     Prior BMS to the 2nd OM in September 2012; with repeat cath in October showing patency  . GERD (gastroesophageal reflux disease)   . IBS (irritable bowel syndrome)   . Aortic root enlargement   . Ascending aortic aneurysm     recent scan in October 2012 showing  no change; followed by Dr. [name]  . PAF (paroxysmal atrial fibrillation)     treated with Coumadin  . Hearing loss   . Contact lens/glasses fitting   . OSA (obstructive sleep apnea)     PSG 03/30/97 AHI 21, BPAP 13/9  . Sleep apnea     Past Surgical History  Procedure Laterality Date  . Hemorroidectomy    . Vasectomy    . Cervical spine surgery  06/02/2010    lower back and neck  . Gastric bypass  05/27/2007  . Cholecystectomy    . Rotator cuff repair      both  . Achilles tendon repair    . Cardiac catheterization  2006  . Coronary stent placement  Sept 2012    2nd OM with BMS  . Cardiac catheterization  October 2012    Stent patent  . Appendectomy    . Eye surgery      bilateral cataract  . Laminectomy  05/30/2012    L 4 L5    History  Smoking status  . Former Smoker -- 1.50 packs/day for 30 years  . Quit date: 01/24/1992  Smokeless tobacco  . Never Used    Comment: Stopped in 1983    History  Alcohol Use No    Family History  Problem Relation Age of Onset  . Heart disease Mother   . Diabetes Mother   . Other Mother     stent placement  . Emphysema Father 61  . Heart attack Sister     Review of Systems: The review of systems is per the HPI. All other systems were reviewed and are negative.  Physical Exam: BP 112/78  Pulse 68  Ht 5\' 7"  (1.702 m)  Wt 284 lb (128.822 kg)  BMI 44.47 kg/m2  SpO2 95% Patient is a pleasant obese white male no acute distress. He is morbidly obese. Skin is warm and dry. Color is normal.  HEENT is unremarkable. Normocephalic/atraumatic. PERRL. Sclera are nonicteric. Neck is supple. No masses. No JVD. Lungs are clear. Cardiac exam shows a regular rate and rhythm. No gallop or murmur. Abdomen is obese but soft. Bowel sounds are positive. Extremities are full but without significant edema. Gait and ROM are intact. No gross neurologic deficits noted.   LABORATORY DATA:  Lab Results  Component Value Date   WBC 6.7 05/21/2012  HGB 13.5 05/21/2012   HCT 39.8 05/21/2012   PLT 137* 05/21/2012   GLUCOSE 270* 05/21/2012   CHOL 124 11/20/2011   TRIG 104.0 11/20/2011   HDL 41.10 11/20/2011   LDLCALC 62 11/20/2011   ALT 11 11/20/2011   AST 19 11/20/2011   NA 138 05/21/2012   K 3.9 05/21/2012   CL 102 05/21/2012   CREATININE 1.07 05/21/2012   BUN 18 05/21/2012   CO2 27 05/21/2012   TSH 1.731 06/09/2010   INR 1.24 05/30/2012   HGBA1C  Value: 6.7 (NOTE)   The ADA recommends the following therapeutic goals for glycemic   control related to Hgb A1C measurement:   Goal of Therapy:   < 7.0% Hgb A1C   Action Suggested:  > 8.0% Hgb A1C   Ref:  Diabetes Care, 22, Suppl. 1, 1999* 08/19/2006   Lab Results  Component Value Date   CHOL 124 11/20/2011     Assessment / Plan:  1. CAD - prior bare-metal stent of the OM in 2012. No recurrent chest pain.   2. Diastolic dysfunction -  Looks well compensated. Doing well on Lasix 80 mg twice a day. Continue sodium restriction.  3. HTN -  Blood pressure is well controlled.  4. Morbid obesity  5. Thoracic aneurysm -followed by Dr. [name] annually. Last CT scan in July of 2013 showed stable appearance. Planned followup later this summer.  6. PAF - now on Xarelto. Patient does note increased bruisability. We'll stop his aspirin therapy.  7. HLD  8. Lumbar stenosis/herniated disc. Status post successful surgery.

## 2012-07-19 NOTE — Patient Instructions (Signed)
Stop taking ASA  Continue your other therapy  I will see you in 6 months. 

## 2012-07-25 ENCOUNTER — Other Ambulatory Visit: Payer: Self-pay | Admitting: Cardiology

## 2012-07-30 NOTE — Telephone Encounter (Signed)
rosuvastatin (CRESTOR) 10 MG tablet  Take 10 mg by mouth daily.

## 2012-08-10 ENCOUNTER — Other Ambulatory Visit: Payer: Self-pay | Admitting: Nurse Practitioner

## 2012-08-16 ENCOUNTER — Telehealth: Payer: Self-pay | Admitting: Cardiology

## 2012-08-16 NOTE — Telephone Encounter (Signed)
New prob  Pt would like to speak with you regarding an appt.

## 2012-08-16 NOTE — Telephone Encounter (Signed)
Returned call to patient he stated since he restarted Xarelto he is having joint pain,lower back pain,lower abdominal pain.Stated would like to go back on Coumadin.Message sent to Dr.Jordan for advice.

## 2012-08-19 NOTE — Telephone Encounter (Signed)
OK to resume coumadin. Will need to coordinate this with whoever follows his coumadin.  Peter [name] MD, [facility]

## 2012-08-19 NOTE — Telephone Encounter (Signed)
Returned call to patient Dr.Jordan advised to resume coumadin.Message sent to coumadin clinic to manage.

## 2012-08-20 ENCOUNTER — Telehealth: Payer: Self-pay | Admitting: *Deleted

## 2012-08-20 NOTE — Telephone Encounter (Signed)
Talked with pt and instructed to restart coumadin today at same dose of Coumadin 5mg  daily except 2.5mg  on Mondays and  Fridays and instructed for next 2 days take extra 1/2 tablet ( total of 7.5mg ) and appt made to be seen in clinic on next Thursday and pt states understanding of these instructions

## 2012-08-20 NOTE — Telephone Encounter (Signed)
Message copied by Jeannine Kitten on Tue Aug 20, 2012 12:28 PM ------      Message from: [name], [name]      Created: Tue Aug 20, 2012 11:05 AM       No I don't think he needs bridging            Peter [name] MD, [facility]                  ----- Message -----         From: Jeannine Kitten, RN         Sent: 08/20/2012   8:43 AM           To: [name] [name], MD            Dr [name]       Do you want Mr Bobier bridged with Lovenox to return to coumadin       Thanks       Lelon Perla        ------

## 2012-08-26 ENCOUNTER — Other Ambulatory Visit: Payer: Self-pay | Admitting: *Deleted

## 2012-08-26 DIAGNOSIS — I712 Thoracic aortic aneurysm, without rupture: Secondary | ICD-10-CM

## 2012-08-29 ENCOUNTER — Ambulatory Visit (INDEPENDENT_AMBULATORY_CARE_PROVIDER_SITE_OTHER): Payer: 59

## 2012-08-29 DIAGNOSIS — I2699 Other pulmonary embolism without acute cor pulmonale: Secondary | ICD-10-CM

## 2012-08-29 DIAGNOSIS — Z7901 Long term (current) use of anticoagulants: Secondary | ICD-10-CM

## 2012-08-29 LAB — POCT INR: INR: 1.4

## 2012-09-02 ENCOUNTER — Other Ambulatory Visit: Payer: Self-pay | Admitting: Cardiothoracic Surgery

## 2012-09-02 LAB — BUN: BUN: 18 mg/dL (ref 6–23)

## 2012-09-02 LAB — CREATININE, SERUM: Creat: 1.04 mg/dL (ref 0.50–1.35)

## 2012-09-03 ENCOUNTER — Ambulatory Visit: Payer: 59 | Admitting: Cardiothoracic Surgery

## 2012-09-03 ENCOUNTER — Encounter: Payer: Self-pay | Admitting: Cardiothoracic Surgery

## 2012-09-03 ENCOUNTER — Ambulatory Visit (INDEPENDENT_AMBULATORY_CARE_PROVIDER_SITE_OTHER): Payer: 59 | Admitting: Cardiothoracic Surgery

## 2012-09-03 ENCOUNTER — Other Ambulatory Visit: Payer: Medicare Other

## 2012-09-03 ENCOUNTER — Ambulatory Visit
Admission: RE | Admit: 2012-09-03 | Discharge: 2012-09-03 | Disposition: A | Discharge status: Home / Self Care | Payer: 59 | Source: Ambulatory Visit | Attending: Cardiothoracic Surgery | Admitting: Cardiothoracic Surgery

## 2012-09-03 VITALS — BP 108/65 | HR 64 | Resp 16 | Ht 67.0 in | Wt 276.0 lb

## 2012-09-03 DIAGNOSIS — I712 Thoracic aortic aneurysm, without rupture, unspecified: Secondary | ICD-10-CM

## 2012-09-03 DIAGNOSIS — Z7189 Other specified counseling: Secondary | ICD-10-CM | POA: Diagnosis not present

## 2012-09-03 DIAGNOSIS — Z712 Person consulting for explanation of examination or test findings: Secondary | ICD-10-CM

## 2012-09-03 MED ORDER — IOHEXOL 350 MG/ML SOLN
100.0000 mL | Freq: Once | INTRAVENOUS | Status: AC | PRN
  Administered 2012-09-03: 100 mL via INTRAVENOUS

## 2012-09-03 NOTE — Progress Notes (Signed)
[address]       [place] [address]             [phone]                   Carlos Dawson [facility] Medical Record #[id number] Date of Birth: Jan 19, 1941  Referring: [name], [name], MD Primary Care: [name], MD  Chief Complaint:    Chief Complaint  Patient presents with  . Follow-up    1 yr with CTA CHEST for TAA surveillance    History of Present Illness:    Patient returns today for followup CT scan one year after previous scan for evaluation of dilated ascending  Aorta. He  has known CAD with  a stent was placed in his circumflex coronary artery in the past. He denies any angina at this time but does have some exertional shortness of breath. He has no angina as he had before coronary stent. Since last seen the patient has had back surgery which he recovered from well.    Current Activity/ Functional Status: Patient is not independent with mobility/ambulation, transfers, ADL's, IADL's.   Past Medical History  Diagnosis Date  . Hypertension   . Obesities, morbid   . Pulmonary embolism     2008  . Anticoagulant long-term use   . CAD (coronary artery disease)     s/p BMS to 2nd OM in Sept 2012  . Thoracic aortic aneurysm     followed by Dr. [name]; last CT November 11, 2010 and unchanged  . OA (osteoarthritis)   . LVH (left ventricular hypertrophy)   . Colonic polyp   . Mild intermittent asthma   . Hemorrhoids   . Generalized headaches   . SOB (shortness of breath)     using oxygen with exercise  . Diastolic dysfunction   . ASCVD (arteriosclerotic cardiovascular disease)     Prior BMS to the 2nd OM in September 2012; with repeat cath in October showing patency  . GERD (gastroesophageal reflux disease)   . IBS (irritable bowel syndrome)   . Aortic root enlargement   . Ascending aortic aneurysm     recent scan in October 2012 showing no change; followed by Dr. [name]  . PAF (paroxysmal atrial fibrillation)    treated with Coumadin  . Hearing loss   . Contact lens/glasses fitting   . OSA (obstructive sleep apnea)     PSG 03/30/97 AHI 21, BPAP 13/9  . Sleep apnea     Past Surgical History  Procedure Laterality Date  . Hemorroidectomy    . Vasectomy    . Cervical spine surgery  06/02/2010    lower back and neck  . Gastric bypass  05/27/2007  . Cholecystectomy    . Rotator cuff repair      both  . Achilles tendon repair    . Cardiac catheterization  2006  . Coronary stent placement  Sept 2012    2nd OM with BMS  . Cardiac catheterization  October 2012    Stent patent  . Appendectomy    . Eye surgery      bilateral cataract  . Laminectomy  05/30/2012    L 4 L5    Family History  Problem Relation Age of Onset  . Heart disease Mother   . Diabetes Mother   . Other Mother     stent placement  . Emphysema Father 23  . Heart attack  Sister     History   Social History  . Marital Status: Married    Spouse Name: N/A    Number of Children: 3  . Years of Education: N/A   Occupational History  . Retired from [employer]    Social History Main Topics  . Smoking status: Former Smoker -- 1.50 packs/day for 30 years    Quit date: 01/24/1992  . Smokeless tobacco: Never Used     Comment: Stopped in 1983  . Alcohol Use: No  . Drug Use: No  . Sexually Active: Yes   Other Topics Concern  . Not on file   Social History Narrative  . No narrative on file    History  Smoking status  . Former Smoker -- 1.50 packs/day for 30 years  . Quit date: 01/24/1992  Smokeless tobacco  . Never Used    Comment: Stopped in 1983    History  Alcohol Use No     Allergies  Allergen Reactions  . Morphine Itching  . Adhesive (Tape) Itching and Rash    Where applied.    Current Outpatient Prescriptions  Medication Sig Dispense Refill  . albuterol (PROVENTIL HFA;VENTOLIN HFA) 108 (90 BASE) MCG/ACT inhaler Inhale 2 puffs into the lungs every 6 (six) hours as needed for wheezing.      Marland Kitchen atenolol  (TENORMIN) 25 MG tablet Take 25 mg by mouth daily.      . cetirizine (ZYRTEC) 10 MG tablet Take 10 mg by mouth daily.      . Cholecalciferol (VITAMIN D) 2000 UNITS CAPS Take by mouth 2 (two) times daily.      . CRESTOR 10 MG tablet TAKE 1 TABLET BY MOUTH AT BEDTIME  90 tablet  3  . finasteride (PROSCAR) 5 MG tablet Take 5 mg by mouth daily.      . fluticasone (FLONASE) 50 MCG/ACT nasal spray Place 2 sprays into the nose daily as needed for rhinitis.      . furosemide (LASIX) 80 MG tablet Take 80 mg by mouth 2 (two) times daily.      Marland Kitchen KLOR-CON M20 20 MEQ tablet TAKE 2 TABLETS BY MOUTH TWICE A DAY  120 tablet  5  . montelukast (SINGULAIR) 10 MG tablet Take 10 mg by mouth at bedtime.      . nitroGLYCERIN (NITROSTAT) 0.4 MG SL tablet Place 0.4 mg under the tongue every 5 (five) minutes as needed for chest pain.      Marland Kitchen oxyCODONE (OXY IR/ROXICODONE) 5 MG immediate release tablet Take 1-2 tablets (5-10 mg total) by mouth every 6 (six) hours as needed for pain.  70 tablet  0  . pantoprazole (PROTONIX) 40 MG tablet TAKE 1 TABLET EVERY DAY  30 tablet  6  . RANEXA 500 MG 12 hr tablet TAKE 1 TABLET BY MOUTH TWICE A DAY  60 tablet  5  . rosuvastatin (CRESTOR) 10 MG tablet Take 10 mg by mouth daily.      Marland Kitchen spironolactone (ALDACTONE) 25 MG tablet Take 25 mg by mouth daily.      . tamsulosin (FLOMAX) 0.4 MG CAPS Take 0.4 mg by mouth daily.      . traMADol (ULTRAM) 50 MG tablet Take 50 mg by mouth every 6 (six) hours as needed for pain.      . valsartan (DIOVAN) 160 MG tablet Take 160 mg by mouth daily.      Marland Kitchen warfarin (COUMADIN) 5 MG tablet Take as directed by coumadin clinic  No current facility-administered medications for this visit.       Review of Systems:     Cardiac Review of Systems: Y or N  Chest Pain [  n  ]  Resting SOB [n   ] Exertional SOB  Cove.Etienne  ]  Orthopnea [ n ]   Pedal Edema [  n ]    Palpitations [n  ] Syncope  [ n]   Presyncope [ n  ]  General Review of Systems: [Y] = yes [   ]=no Constitional: recent weight change [ y]; anorexia [  ]; fatigue [ y ]; nausea [n  ]; night sweats [ n ]; fever [ n ]; or chills [n  ];                                                                                                                                          Dental: poor dentition[  ];   Eye : blurred vision [  ]; diplopia [   ]; vision changes [  ];  Amaurosis fugax[ n ]; Resp: cough [ n ];  wheezing[n  ];  hemoptysis[n  ]; shortness of breath[ y ]; paroxysmal nocturnal dyspnea[  ]; dyspnea on exertion[ y ]; or orthopnea[  ];  GI:  gallstones[  ], vomiting[  ];  dysphagia[  ]; melena[  ];  hematochezia [ n ]; heartburn[  ];   Hx of  Colonoscopy[ y ]; GU: kidney stones [  ]; hematuria[  ];   dysuria [  ];  nocturia[  ];  history of     obstruction [  ];             Skin: rash, swelling[  ];, hair loss[  ];  peripheral edema[  ];  or itching[  ]; Musculosketetal: myalgias[  ];  joint swelling[  ];  joint erythema[  ];  joint pain[  ];  back pain[  ];  Heme/Lymph: bruising[  ];  bleeding[  ];  anemia[  ];  Neuro: TIA[  ];  headaches[  ];  stroke[  ];  vertigo[  ];  seizures[  ];   paresthesias[  ];  difficulty walking[n  ];  Psych:depression[  ]; anxiety[  ];  Endocrine: diabetes[  ];  thyroid dysfunction[  ];  Immunizations: Flu Cove.Etienne  ]; Pneumococcal[ y];  Other:  Physical Exam: BP 108/65  Pulse 64  Resp 16  Ht 5\' 7"  (1.702 m)  Wt 276 lb (125.193 kg)  BMI 43.22 kg/m2  SpO2 97%  General appearance: alert, cooperative, appears older than stated age and fatigued Neurologic: intact Heart: Irregular irregular pulse -afib, S1, S2 normal, no murmur, click, rub or gallop and normal apical impulse Lungs: clear to auscultation bilaterally and normal percussion bilaterally Abdomen: soft, non-tender; bowel sounds normal; no masses,  no organomegaly Extremities: no edema at ankles currently  Diagnostic Studies & Laboratory data:     Recent Radiology Findings:   Ct Angio  Chest Aorta W/cm &/or Wo/cm  09/03/2012   *RADIOLOGY REPORT*  Clinical Data: Evaluate aortic aneurysm, history of smoking  CT ANGIOGRAPHY CHEST  Technique:  Multidetector CT imaging of the chest using the standard protocol during bolus administration of intravenous contrast. Multiplanar reconstructed images including MIPs were obtained and reviewed to evaluate the vascular anatomy.  Contrast: OMNIPAQUE IOHEXOL 350 MG/ML SOLN  Comparison: Chest CT - 09/14/2011; 11/11/2010; 02/27/2010  Vascular Findings:  Grossly unchanged fusiform aneurysmal dilatation of the descending thoracic aorta with measurements as follows.  The thoracic aorta tapers to a normal caliber at the level of the posterior aspect of the aortic arch.  The descending thoracic aorta is mildly tortuous but of normal caliber.  No thoracic aortic dissection or periaortic stranding.  Review of the precontrast images are negative for the development of an intramural hematoma.  Bovine configuration of the aortic arch is again incidentally noted. The major branch vessels of the aortic arch are widely patent throughout their imaged course.  Cardiomegaly.  Coronary artery calcifications.  No pericardial effusion.  There is unchanged enlargement the caliber of the main pulmonary artery, nonspecific but could be seen in the setting of pulmonary arterial hypertension.  Although this examination was not tailored for evaluation of the pulmonary arteries, there are no discrete filling defects within the pulmonary arterial tree to the level of the bilateral subsegmental pulmonary arteries to suggest pulmonary embolism.  Thoracic aortic measurements:  Sinotubular junction: 45 mm as measured in greatest oblique coronal dimension.  Proximal ascending aorta: 50 mm as measured in greatest oblique axial dimension at the level of the main pulmonary artery. Approximately 50 mm in greatest oblique coronal dimension.  Aortic arch aorta: 33 mm as measured in greatest  oblique sagittal dimension.  Proximal descending thoracic aorta: 32 mm as measured in greatest oblique axial dimension at the level of the main pulmonary artery.  Distal descending thoracic aorta: 25 mm as measured in greatest oblique axial dimension at the level of the diaphragmatic hiatus.  -----------------------------------------------------------  Nonvascular findings:  The approximately 4 mm noncalcified nodule within the right middle lobe (image 36) appears grossly unchanged since most recently performed chest CT to minimally enlarged since the 02/27/2010 examination at which time the nodule measured approximately 2 mm in diameter.  No new discrete pulmonary nodules.  There is persistent subsegmental atelectasis with the medial aspect the right upper lobe.  Grossly symmetric subpleural ground-glass atelectasis.  No focal airspace opacity.  No pleural effusion or pneumothorax.  The central pulmonary airways are patent.  No mediastinal, hilar or axillary lymphadenopathy.  Limited evaluation of the upper abdomen demonstrates the sequela of prior gastric banding surgery.  Post cholecystectomy.   Punctate calcification within the dome of the left lobe of the liver (image 99, series four), favored to be the sequela of prior granulomatous infection.  An isolated enlarged mesenteric lymph node has minimally increased in size in the interval, currently measuring 1.9 cm in greatest short axis diameter (image 150, series 4), previously 1.5 cm on the 02/27/2010 examination.  No acute or aggressive osseous abnormalities.  IMPRESSION:  1.  Grossly unchanged fusiform aneurysmal dilatation of the ascending thoracic aorta measuring 50 mm.  2.  Cardiomegaly.  Coronary artery calcifications.  Stable enlargement caliber of the main pulmonary artery, nonspecific but could be seen in the setting of pulmonary arterial hypertension.  3.  Interval increase in size  of the now approximately 4 mm noncalcified nodule within the right  middle lobe, unchanged since recently performed chest CTs, though minimally increased since the 02/2010 examination (at which time the nodule measured approximately 2 mm).  Continued attention on follow-up is recommended.  4.  Post cholecystectomy and gastric banding.  5.  Minimal increase in the now approximately 1.9 cm isolated enlarged mesenteric lymph node, previously measuring 1.5 cm on chest CT performed 02/2010 examination.  Given relatively slow interval growth this is most likely to be of benign etiology though continued attention on follow-up is recommended.   Original Report Authenticated By: Tacey Ruiz, MD      Recent Lab Findings: Lab Results  Component Value Date   WBC 6.7 05/21/2012   HGB 13.5 05/21/2012   HCT 39.8 05/21/2012   PLT 137* 05/21/2012   GLUCOSE 270* 05/21/2012   CHOL 124 11/20/2011   TRIG 104.0 11/20/2011   HDL 41.10 11/20/2011   LDLCALC 62 11/20/2011   ALT 11 11/20/2011   AST 19 11/20/2011   NA 138 05/21/2012   K 3.9 05/21/2012   CL 102 05/21/2012   CREATININE 1.04 09/02/2012   BUN 18 09/02/2012   CO2 27 05/21/2012   TSH 1.731 06/09/2010   INR 1.4 08/29/2012   HGBA1C  Value: 6.7 (NOTE)   The ADA recommends the following therapeutic goals for glycemic   control related to Hgb A1C measurement:   Goal of Therapy:   < 7.0% Hgb A1C   Action Suggested:  > 8.0% Hgb A1C   Ref:  Diabetes Care, 22, Suppl. 1, 1999* 08/19/2006   Aortic Size Index=     5.0    /Body surface area is 2.43 meters squared. = 2.05  < 2.75 cm/m2      4% risk per year 2.75 to 4.25          8% risk per year > 4.25 cm/m2    20% risk per year  cross sectional area of aorta cm2/height in meters  19.6/ 1.7= 11.5   Assessment / Plan:     The status of the patient's dilated ascending aorta appears stable at just under 5 cm in size. With his multiple comorbidities and current risk because of a dilated aorta at 4% per  Year I have discussed with him continued observation. He continues with a good blood  pressure control is monitored by Dr. [name]. We'll plan to see him back in one year with a followup CT A of the chest.     Delight Ovens MD  Beeper [phone] Office [phone] 09/03/2012 3:26 PM

## 2012-09-07 ENCOUNTER — Other Ambulatory Visit: Payer: Self-pay | Admitting: Pulmonary Disease

## 2012-09-09 ENCOUNTER — Ambulatory Visit (INDEPENDENT_AMBULATORY_CARE_PROVIDER_SITE_OTHER): Payer: 59 | Admitting: *Deleted

## 2012-09-09 DIAGNOSIS — Z7901 Long term (current) use of anticoagulants: Secondary | ICD-10-CM

## 2012-09-09 DIAGNOSIS — I2699 Other pulmonary embolism without acute cor pulmonale: Secondary | ICD-10-CM

## 2012-09-09 LAB — POCT INR: INR: 2.3

## 2012-09-17 ENCOUNTER — Other Ambulatory Visit: Payer: Self-pay | Admitting: Pulmonary Disease

## 2012-09-26 ENCOUNTER — Ambulatory Visit: Payer: 59 | Admitting: Cardiothoracic Surgery

## 2012-09-30 ENCOUNTER — Ambulatory Visit (INDEPENDENT_AMBULATORY_CARE_PROVIDER_SITE_OTHER): Payer: 59

## 2012-09-30 DIAGNOSIS — Z7901 Long term (current) use of anticoagulants: Secondary | ICD-10-CM

## 2012-09-30 DIAGNOSIS — I2699 Other pulmonary embolism without acute cor pulmonale: Secondary | ICD-10-CM | POA: Diagnosis not present

## 2012-09-30 LAB — POCT INR: INR: 1.6

## 2012-10-07 DIAGNOSIS — N3941 Urge incontinence: Secondary | ICD-10-CM | POA: Insufficient documentation

## 2012-10-21 ENCOUNTER — Ambulatory Visit (INDEPENDENT_AMBULATORY_CARE_PROVIDER_SITE_OTHER): Payer: 59

## 2012-10-21 DIAGNOSIS — Z7901 Long term (current) use of anticoagulants: Secondary | ICD-10-CM | POA: Diagnosis not present

## 2012-10-21 DIAGNOSIS — I2699 Other pulmonary embolism without acute cor pulmonale: Secondary | ICD-10-CM | POA: Diagnosis not present

## 2012-10-21 LAB — POCT INR: INR: 2.5

## 2012-11-05 ENCOUNTER — Other Ambulatory Visit: Payer: Self-pay | Admitting: Cardiology

## 2012-11-06 ENCOUNTER — Other Ambulatory Visit: Payer: Self-pay | Admitting: *Deleted

## 2012-11-08 ENCOUNTER — Telehealth: Payer: Self-pay | Admitting: Cardiology

## 2012-11-08 NOTE — Telephone Encounter (Signed)
Returned call to Lauren at [facility] Failure no answer.LMTC.

## 2012-11-08 NOTE — Telephone Encounter (Signed)
New problem   Want information on pt's last EF. Please call

## 2012-11-18 ENCOUNTER — Telehealth: Payer: Self-pay | Admitting: Cardiology

## 2012-11-18 ENCOUNTER — Ambulatory Visit (INDEPENDENT_AMBULATORY_CARE_PROVIDER_SITE_OTHER): Payer: 59 | Admitting: General Practice

## 2012-11-18 DIAGNOSIS — Z7901 Long term (current) use of anticoagulants: Secondary | ICD-10-CM

## 2012-11-18 DIAGNOSIS — I2699 Other pulmonary embolism without acute cor pulmonale: Secondary | ICD-10-CM

## 2012-11-18 LAB — POCT INR: INR: 2.5

## 2012-11-18 NOTE — Telephone Encounter (Signed)
New Patient  Carlos Dawson with [facility] heart failure program, she needs a copy of the last echo that was done

## 2012-11-18 NOTE — Telephone Encounter (Signed)
See previous 11/18/12 message.

## 2012-11-18 NOTE — Telephone Encounter (Signed)
Returned call to [employer] at [facility] no answer.LMTC.

## 2012-11-25 NOTE — Telephone Encounter (Signed)
Returned call to Lauren at [facility] no answer.LMTC. 

## 2012-11-26 NOTE — Telephone Encounter (Signed)
Lauren with [facility] never returned call.

## 2012-12-17 ENCOUNTER — Ambulatory Visit (INDEPENDENT_AMBULATORY_CARE_PROVIDER_SITE_OTHER): Payer: 59 | Admitting: *Deleted

## 2012-12-17 DIAGNOSIS — I2699 Other pulmonary embolism without acute cor pulmonale: Secondary | ICD-10-CM

## 2012-12-17 DIAGNOSIS — Z7901 Long term (current) use of anticoagulants: Secondary | ICD-10-CM

## 2012-12-17 LAB — POCT INR: INR: 3.4

## 2012-12-24 ENCOUNTER — Encounter: Payer: Self-pay | Admitting: Pulmonary Disease

## 2012-12-24 ENCOUNTER — Ambulatory Visit (INDEPENDENT_AMBULATORY_CARE_PROVIDER_SITE_OTHER): Payer: 59 | Admitting: Pulmonary Disease

## 2012-12-24 ENCOUNTER — Ambulatory Visit: Payer: 59 | Admitting: Pulmonary Disease

## 2012-12-24 VITALS — BP 120/78 | HR 59 | Ht 67.0 in | Wt 285.0 lb

## 2012-12-24 DIAGNOSIS — G4733 Obstructive sleep apnea (adult) (pediatric): Secondary | ICD-10-CM | POA: Diagnosis not present

## 2012-12-24 DIAGNOSIS — J309 Allergic rhinitis, unspecified: Secondary | ICD-10-CM | POA: Diagnosis not present

## 2012-12-24 DIAGNOSIS — J45909 Unspecified asthma, uncomplicated: Secondary | ICD-10-CM | POA: Diagnosis not present

## 2012-12-24 DIAGNOSIS — J4530 Mild persistent asthma, uncomplicated: Secondary | ICD-10-CM

## 2012-12-24 NOTE — Assessment & Plan Note (Signed)
Stable.  He is to continue flonase, zyrtec, and singulair.

## 2012-12-24 NOTE — Progress Notes (Signed)
Chief Complaint  Patient presents with  . Follow-up    Not wearing CPAP x2 months.  States in uncomfortable and stinks and leave bad taste in mouth    History of Present Illness: Carlos Dawson is a 72 y.o. male former smoker with dyspnea, OSA on BPAP 12/8, and asthma.  His BiPAP machine is > 20 years old.  He has full face mask, and this fits well.  He still has dry mouth.  He increases his humidifier temperature, but then gets water build up in his tube.  He has tube hugger.  He gets bad taste in his mouth when his mouth stays dry.  His breathing is doing well otherwise.  He is not longer using Qvar.  He still uses singulair, and flonase.    Tests: PSG 03/30/97 >> AHI 21 ONO with BPAP and room air 12/12/10 >> Test time 6 hrs 56 min.  Basal SpO2 93.1%, low SpO2 86%.  Spent 2.8 min with SpO2 < 88%. PFT 12/20/10 >> FEV1 2.13(79%), FEV1% 71, TLC 5.80(100%), DLCO 57%, no BD CT chest 09/14/11 >> Stable dilatation of ascending thoracic aorta 4.7 cm, PA 4.1 cm BPAP 09/30/11 to 11/28/11>>Used on 29 of 60 nights with average 3 hrs 21 min. Average AHI 3 with BiPAP 13/9 cm H2O.  He  has a past medical history of Hypertension; Obesities, morbid; Pulmonary embolism; Anticoagulant long-term use; CAD (coronary artery disease); Thoracic aortic aneurysm; OA (osteoarthritis); LVH (left ventricular hypertrophy); Colonic polyp; Mild intermittent asthma; Hemorrhoids; Generalized headaches; SOB (shortness of breath); Diastolic dysfunction; ASCVD (arteriosclerotic cardiovascular disease); GERD (gastroesophageal reflux disease); IBS (irritable bowel syndrome); Aortic root enlargement; Ascending aortic aneurysm; PAF (paroxysmal atrial fibrillation); Hearing loss; Contact lens/glasses fitting; OSA (obstructive sleep apnea); and Sleep apnea.  He  has past surgical history that includes Hemorroidectomy; Vasectomy; Cervical spine surgery (06/02/2010); Gastric bypass (05/27/2007); Cholecystectomy; Rotator cuff repair; Achilles  tendon repair; Cardiac catheterization (2006); Coronary stent placement (Sept 2012); Cardiac catheterization (October 2012); Appendectomy; Eye surgery; and Laminectomy (05/30/2012).   Current Outpatient Prescriptions on File Prior to Visit  Medication Sig Dispense Refill  . albuterol (PROVENTIL HFA;VENTOLIN HFA) 108 (90 BASE) MCG/ACT inhaler Inhale 2 puffs into the lungs every 6 (six) hours as needed for wheezing.      Marland Kitchen atenolol (TENORMIN) 25 MG tablet Take 25 mg by mouth daily.      . cetirizine (ZYRTEC) 10 MG tablet Take 10 mg by mouth daily.      . Cholecalciferol (VITAMIN D) 2000 UNITS CAPS Take by mouth 2 (two) times daily.      . CRESTOR 10 MG tablet TAKE 1 TABLET BY MOUTH AT BEDTIME  90 tablet  3  . finasteride (PROSCAR) 5 MG tablet Take 5 mg by mouth daily.      . fluticasone (FLONASE) 50 MCG/ACT nasal spray Place 2 sprays into the nose daily as needed for rhinitis.      . furosemide (LASIX) 80 MG tablet Take 80 mg by mouth 2 (two) times daily.      Marland Kitchen KLOR-CON M20 20 MEQ tablet TAKE 2 TABLETS BY MOUTH TWICE A DAY  120 tablet  5  . montelukast (SINGULAIR) 10 MG tablet Take 10 mg by mouth at bedtime.      . nitroGLYCERIN (NITROSTAT) 0.4 MG SL tablet Place 0.4 mg under the tongue every 5 (five) minutes as needed for chest pain.      . pantoprazole (PROTONIX) 40 MG tablet TAKE 1 TABLET EVERY DAY  30 tablet  6  . RANEXA 500 MG 12 hr tablet TAKE 1 TABLET BY MOUTH TWICE A DAY  60 tablet  5  . rosuvastatin (CRESTOR) 10 MG tablet Take 10 mg by mouth daily.      Marland Kitchen spironolactone (ALDACTONE) 25 MG tablet Take 25 mg by mouth daily.      . tamsulosin (FLOMAX) 0.4 MG CAPS Take 0.4 mg by mouth daily.      . traMADol (ULTRAM) 50 MG tablet Take 50 mg by mouth every 6 (six) hours as needed for pain.      . valsartan (DIOVAN) 160 MG tablet Take 160 mg by mouth daily.      . VENTOLIN HFA 108 (90 BASE) MCG/ACT inhaler INHALE 2 PUFFS INTO LUNGS EVERY 6 HOURS AS NEEDED FOR WHEEZING  18 each  2  . warfarin  (COUMADIN) 5 MG tablet TAKE AS DIRECTED BY ANTICOAGULATION CLINIC  60 tablet  2   No current facility-administered medications on file prior to visit.    Allergies  Allergen Reactions  . Morphine Itching  . Adhesive [Tape] Itching and Rash    Where applied.   Physical Exam:  General - Obese  HEENT - No sinus tenderness, no oral exudate, no LAN  Cardiac - s1s2 regular, no murmur  Chest - normal respiratory excursion, no wheeze/rales/dullness  Abdomen - obese, soft  Extremities - +1 ankle edema  Skin - no rashes  Neurologic - normal strength  Psychiatric - normal mood, behavior   Assessment/Plan:  Coralyn Helling, MD [facility] Pulmonary/Critical Care 12/24/2012, 4:27 PM Pager:  [phone] After 3pm call: [phone]

## 2012-12-24 NOTE — Assessment & Plan Note (Signed)
Will arrange for new BiPAP machine at 12/8 cm H2O.  Will get download after he uses his new machine for several weeks, and then determine if he needs additional changes to his set up.  Discussed techniques to limit development of rain out.

## 2012-12-24 NOTE — Patient Instructions (Signed)
Will arrange for new BiPAP machine Will call with results of BiPAP download report Follow up in 6 months

## 2012-12-24 NOTE — Assessment & Plan Note (Signed)
He is to continue singulair and prn albuterol

## 2012-12-25 ENCOUNTER — Telehealth: Payer: Self-pay | Admitting: Pulmonary Disease

## 2012-12-25 DIAGNOSIS — G4733 Obstructive sleep apnea (adult) (pediatric): Secondary | ICD-10-CM

## 2012-12-25 NOTE — Telephone Encounter (Signed)
lmomtcb x1 for [name].  

## 2012-12-26 ENCOUNTER — Encounter: Payer: Self-pay | Admitting: Physician Assistant

## 2012-12-26 ENCOUNTER — Other Ambulatory Visit: Payer: Self-pay | Admitting: Cardiology

## 2012-12-26 ENCOUNTER — Ambulatory Visit (INDEPENDENT_AMBULATORY_CARE_PROVIDER_SITE_OTHER): Payer: Medicare Other | Admitting: Physician Assistant

## 2012-12-26 VITALS — BP 122/70 | HR 57 | Ht 67.0 in | Wt 275.0 lb

## 2012-12-26 DIAGNOSIS — I2699 Other pulmonary embolism without acute cor pulmonale: Secondary | ICD-10-CM

## 2012-12-26 DIAGNOSIS — I480 Paroxysmal atrial fibrillation: Secondary | ICD-10-CM

## 2012-12-26 DIAGNOSIS — E785 Hyperlipidemia, unspecified: Secondary | ICD-10-CM | POA: Insufficient documentation

## 2012-12-26 DIAGNOSIS — I712 Thoracic aortic aneurysm, without rupture: Secondary | ICD-10-CM

## 2012-12-26 DIAGNOSIS — I4891 Unspecified atrial fibrillation: Secondary | ICD-10-CM

## 2012-12-26 DIAGNOSIS — R079 Chest pain, unspecified: Secondary | ICD-10-CM

## 2012-12-26 DIAGNOSIS — I10 Essential (primary) hypertension: Secondary | ICD-10-CM

## 2012-12-26 DIAGNOSIS — I5032 Chronic diastolic (congestive) heart failure: Secondary | ICD-10-CM | POA: Insufficient documentation

## 2012-12-26 DIAGNOSIS — I2510 Atherosclerotic heart disease of native coronary artery without angina pectoris: Secondary | ICD-10-CM

## 2012-12-26 NOTE — Telephone Encounter (Signed)
Carlos Dawson is aware that we will place the order for this. Order has been placed.

## 2012-12-26 NOTE — Telephone Encounter (Signed)
Okay to place order to assess and repair current machine.  Please arrange for ROV 2 months after machine repaired.

## 2012-12-26 NOTE — Telephone Encounter (Signed)
bipap order for replacement unit. Pt won't be in his 5 mark year until next year. He has medicare. In last OV it was documented he was not wearing the machine x 2 months. Efraim Kaufmann is wanting to know if we can send an order to see if his current CPAP machine can repaired or assess this? Please advise dr. Craige Cotta thanks

## 2012-12-26 NOTE — Progress Notes (Signed)
[address], [address] [place], [place]  [address] Phone: [phone] Fax:  [phone]  Date:  12/26/2012   ID:  DIALLO PONDER, DOB 09-Oct-1940, MRN [id number]  PCP:  [name], MD  Cardiologist:  Dr. Peter [name]     History of Present Illness: Carlos Dawson is a 72 y.o. male with a hx of CAD, s/p BMS to the OM2 in 09/2010, diastolic CHF, AFib, thoracic aortic aneurysm, prior pulmonary emboli (in setting of Achilles tendon repair and period of immobilization), morbid obesity s/p prior lap banding, HTN, HL, OSA.  Echo (02/2010):  Limited study, severe LVH, EF 55%, Ao root 5cm. LHC (10/2010):  Separate ostia for CFX and LAD, Int Br (OM) stent patent, no significant CAD elsewhere.  Chest CTA (08/2012):  Ascending thoracic aortic aneurysm 5 cm, RML 4 mm nodule, 1.9 cm mesenteric lymph node.  Last seen by Dr. [name] for f/u on thoracic aneurysm in 08/2012.  Last seen by Dr. Peter [name] 06/2012.    He continues to note chronic DOE.  This is fairly stable.  He is NYHA Class 2b.  He has noted exertional chest pressure over the last 3-4 mos.  It is not getting any worse.  No assoc symptoms aside from dyspnea.  He notes prompt resolution with rest.  He has not tried NTG.  He notes symptoms with more mod to extreme activities (probable CCS Class 2).  He denies syncope, orthopnea, PND.  LE edema is stable to improved.    Recent Labs: 05/21/2012: Hemoglobin 13.5; Potassium 3.9  09/02/2012: Creatinine 1.04   Wt Readings from Last 3 Encounters:  12/26/12 275 lb (124.739 kg)  12/24/12 285 lb (129.275 kg)  09/03/12 276 lb (125.193 kg)     Past Medical History  Diagnosis Date  . Hypertension   . Obesities, morbid   . Pulmonary embolism     2008  . Anticoagulant long-term use   . CAD (coronary artery disease)     s/p BMS to 2nd OM in Sept 2012  . Thoracic aortic aneurysm     followed by Dr. [name]; last CT November 11, 2010 and unchanged  . OA (osteoarthritis)   . LVH (left  ventricular hypertrophy)   . Colonic polyp   . Mild intermittent asthma   . Hemorrhoids   . Generalized headaches   . SOB (shortness of breath)     using oxygen with exercise  . Diastolic dysfunction   . ASCVD (arteriosclerotic cardiovascular disease)     Prior BMS to the 2nd OM in September 2012; with repeat cath in October showing patency  . GERD (gastroesophageal reflux disease)   . IBS (irritable bowel syndrome)   . Aortic root enlargement   . Ascending aortic aneurysm     recent scan in October 2012 showing no change; followed by Dr. [name]  . PAF (paroxysmal atrial fibrillation)     treated with Coumadin  . Hearing loss   . Contact lens/glasses fitting   . OSA (obstructive sleep apnea)     PSG 03/30/97 AHI 21, BPAP 13/9  . Sleep apnea     Current Outpatient Prescriptions  Medication Sig Dispense Refill  . albuterol (PROVENTIL HFA;VENTOLIN HFA) 108 (90 BASE) MCG/ACT inhaler Inhale 2 puffs into the lungs every 6 (six) hours as needed for wheezing.      Marland Kitchen atenolol (TENORMIN) 25 MG tablet Take 25 mg by mouth daily.      . cetirizine (ZYRTEC) 10 MG tablet Take 10  mg by mouth daily.      . Cholecalciferol (VITAMIN D) 2000 UNITS CAPS Take by mouth 2 (two) times daily.      . CRESTOR 10 MG tablet TAKE 1 TABLET BY MOUTH AT BEDTIME  90 tablet  3  . finasteride (PROSCAR) 5 MG tablet Take 5 mg by mouth daily.      . fluticasone (FLONASE) 50 MCG/ACT nasal spray Place 2 sprays into the nose daily as needed for rhinitis.      . furosemide (LASIX) 80 MG tablet Take 80 mg by mouth 2 (two) times daily.      Marland Kitchen KLOR-CON M20 20 MEQ tablet TAKE 2 TABLETS BY MOUTH TWICE A DAY  120 tablet  5  . montelukast (SINGULAIR) 10 MG tablet Take 10 mg by mouth at bedtime.      . nitroGLYCERIN (NITROSTAT) 0.4 MG SL tablet Place 0.4 mg under the tongue every 5 (five) minutes as needed for chest pain.      . pantoprazole (PROTONIX) 40 MG tablet TAKE 1 TABLET EVERY DAY  30 tablet  6  . RANEXA 500 MG 12 hr  tablet TAKE 1 TABLET BY MOUTH TWICE A DAY  60 tablet  5  . rosuvastatin (CRESTOR) 10 MG tablet Take 10 mg by mouth daily.      Marland Kitchen spironolactone (ALDACTONE) 25 MG tablet Take 25 mg by mouth daily.      . tamsulosin (FLOMAX) 0.4 MG CAPS Take 0.4 mg by mouth daily.      . traMADol (ULTRAM) 50 MG tablet Take 50 mg by mouth every 6 (six) hours as needed for pain.      . valsartan (DIOVAN) 160 MG tablet Take 160 mg by mouth daily.      . VENTOLIN HFA 108 (90 BASE) MCG/ACT inhaler INHALE 2 PUFFS INTO LUNGS EVERY 6 HOURS AS NEEDED FOR WHEEZING  18 each  2  . warfarin (COUMADIN) 5 MG tablet TAKE AS DIRECTED BY ANTICOAGULATION CLINIC  60 tablet  2   No current facility-administered medications for this visit.    Allergies:   Morphine and Adhesive   Social History:  The patient  reports that he quit smoking about 20 years ago. He has never used smokeless tobacco. He reports that he does not drink alcohol or use illicit drugs.   Family History:  The patient's family history includes Diabetes in his mother; Emphysema (age of onset: 25) in his father; Heart attack in his sister; Heart disease in his mother; Other in his mother.   ROS:  Please see the history of present illness.      All other systems reviewed and negative.   PHYSICAL EXAM: VS:  BP 122/70  Pulse 57  Ht 5\' 7"  (1.702 m)  Wt 275 lb (124.739 kg)  BMI 43.06 kg/m2 Well nourished, well developed, in no acute distress HEENT: normal Neck: no JVD Cardiac:  normal S1, S2; RRR; no murmur Lungs:  clear to auscultation bilaterally, no wheezing, rhonchi or rales Abd: soft, nontender, no hepatomegaly Ext: trace bilateral LE edema Skin: warm and dry Neuro:  CNs 2-12 intact, no focal abnormalities noted  EKG:  Sinus brady, HR 57, PACs, no change from prior tracing     ASSESSMENT AND PLAN:  1. CAD:  He has noted exertional chest pain recently.  He probably describes CCS Class 2 symptoms.  His DOE is overall stable.  It has been 2 years since  his last assessment for ischemia.  ECG is unchanged.  Continue statin.  He is not on ASA as he is on warfarin.  I will arrange a Lexiscan Myoview.  He has an up to date Rx for NTG. 2. Dyspnea:  This is chronic and stable.  It is likely multifactorial and related to obesity, diastolic dysfunction, asthma and deconditioning.   3. Chronic Diastolic CHF:  Volume stable.  Continue current.  Will request [facility] done this week at the [facility].  4. Paroxysmal Atrial Fibrillation:  Maintaining NSR.  He is tolerating coumadin.  This is managed at [facility]. 5. Ascending Thoracic Aortic Aneurysm:  F/u with Dr. [name] in 08/2013. 6. Hypertension:  Controlled. 7. Hyperlipidemia:  Continue statin. As noted, will get labs from [facility]  8. Disposition:  F/u with Dr. Peter [name] in 6 mos or sooner if myoview abnormal.    Signed, Tereso Newcomer, PA-C  12/26/2012 9:00 AM

## 2012-12-26 NOTE — Telephone Encounter (Signed)
[id number] with [name]. Carron Curie, CMA

## 2012-12-26 NOTE — Patient Instructions (Signed)
.  Your physician has requested that you have a lexiscan myoview. For further information please visit [email or web address] Please follow instruction sheet, as given.  Your physician wants you to follow-up in: 6 MONTHS WITH DR [name]  You will receive a reminder letter in the mail two months in advance. If you don't receive a letter, please call our office to schedule the follow-up appointment.  Send a copy of labs done this week at [facility] to our office [phone] Fax )

## 2012-12-26 NOTE — Telephone Encounter (Signed)
Also if we do this, pt will have to have an OV after wearing the CPAP for a while documenting he is compliant per medicare guidelines. Please advise thanks

## 2012-12-26 NOTE — Telephone Encounter (Signed)
[name] returned triage's call.  Holly D Pryor ° °

## 2013-01-03 ENCOUNTER — Telehealth: Payer: Self-pay | Admitting: Physician Assistant

## 2013-01-03 NOTE — Telephone Encounter (Signed)
pt states went to [facility] and had cxr which he states dr said CHF getting worse. pt states he was told to call his cardiologist to have lasix increased which pt is already on 81 mg bid or to be seen. I sch pt for 12/15 w/SW. PA. asked pt if sob, edema. Pt said these are ok. Pt states [place]. I advised pt to go to the hospital if he is getting worse. Pt said ok and thank you. I did state I will call [facility] to get cxr report faxed over for 12/15 appt. Pt also said he had recent lab work with the [place], I asked for pt to bring the lab work to 12/15 appt. Pt  Verbalized understanding to all information.

## 2013-01-03 NOTE — Telephone Encounter (Signed)
pt states went to [facility] and had cxr which he states dr said CHF getting worse. pt states he was told to call his cardiologist to have lasix increased which pt is already on 81 mg bid or to be seen. I sch pt for 12/15 w/SW. PA. asked pt if sob, edema. Pt said these are ok. Pt states he is [place] his son. I advised pt to go to the hospital if he is getting worse. Pt said ok and thank you. I did state I will call [facility] to get cxr report faxed over for 12/15 appt. Pt also said [facility], I asked for pt to bring the lab work to 12/15 appt. Pt  Verbalized understanding to all information. 

## 2013-01-03 NOTE — Telephone Encounter (Signed)
New Problem:  Pt states he had an x-ray done at [facility] today and it showed his CHF was getting worse. Pt states the doctor said he needed to contact his cardiologist to have them increase his lasix or either be seen. Pt would like a call back from the [facility] or his assistant.

## 2013-01-06 ENCOUNTER — Observation Stay (HOSPITAL_COMMUNITY)
Admission: AD | Admit: 2013-01-06 | Discharge: 2013-01-07 | Disposition: A | Discharge status: Home / Self Care | Payer: 59 | Source: Ambulatory Visit | Attending: Cardiology | Admitting: Cardiology

## 2013-01-06 ENCOUNTER — Encounter (HOSPITAL_COMMUNITY): Payer: Self-pay | Admitting: General Practice

## 2013-01-06 ENCOUNTER — Encounter: Payer: Self-pay | Admitting: Physician Assistant

## 2013-01-06 ENCOUNTER — Ambulatory Visit (INDEPENDENT_AMBULATORY_CARE_PROVIDER_SITE_OTHER): Payer: 59 | Admitting: General Practice

## 2013-01-06 ENCOUNTER — Observation Stay (HOSPITAL_COMMUNITY): Payer: 59

## 2013-01-06 ENCOUNTER — Ambulatory Visit (INDEPENDENT_AMBULATORY_CARE_PROVIDER_SITE_OTHER): Payer: 59 | Admitting: Physician Assistant

## 2013-01-06 VITALS — BP 142/74 | HR 65 | Ht 67.0 in | Wt 277.1 lb

## 2013-01-06 DIAGNOSIS — Z9861 Coronary angioplasty status: Secondary | ICD-10-CM | POA: Insufficient documentation

## 2013-01-06 DIAGNOSIS — K219 Gastro-esophageal reflux disease without esophagitis: Secondary | ICD-10-CM | POA: Insufficient documentation

## 2013-01-06 DIAGNOSIS — R079 Chest pain, unspecified: Principal | ICD-10-CM

## 2013-01-06 DIAGNOSIS — E785 Hyperlipidemia, unspecified: Secondary | ICD-10-CM

## 2013-01-06 DIAGNOSIS — Z860100 Personal history of colon polyps, unspecified: Secondary | ICD-10-CM | POA: Insufficient documentation

## 2013-01-06 DIAGNOSIS — Z9884 Bariatric surgery status: Secondary | ICD-10-CM

## 2013-01-06 DIAGNOSIS — Z7901 Long term (current) use of anticoagulants: Secondary | ICD-10-CM

## 2013-01-06 DIAGNOSIS — I2510 Atherosclerotic heart disease of native coronary artery without angina pectoris: Secondary | ICD-10-CM

## 2013-01-06 DIAGNOSIS — Z79899 Other long term (current) drug therapy: Secondary | ICD-10-CM | POA: Insufficient documentation

## 2013-01-06 DIAGNOSIS — I712 Thoracic aortic aneurysm, without rupture, unspecified: Secondary | ICD-10-CM | POA: Insufficient documentation

## 2013-01-06 DIAGNOSIS — I517 Cardiomegaly: Secondary | ICD-10-CM | POA: Insufficient documentation

## 2013-01-06 DIAGNOSIS — I5032 Chronic diastolic (congestive) heart failure: Secondary | ICD-10-CM

## 2013-01-06 DIAGNOSIS — Z9981 Dependence on supplemental oxygen: Secondary | ICD-10-CM | POA: Insufficient documentation

## 2013-01-06 DIAGNOSIS — J45909 Unspecified asthma, uncomplicated: Secondary | ICD-10-CM | POA: Insufficient documentation

## 2013-01-06 DIAGNOSIS — M1990 Unspecified osteoarthritis, unspecified site: Secondary | ICD-10-CM | POA: Insufficient documentation

## 2013-01-06 DIAGNOSIS — I5030 Unspecified diastolic (congestive) heart failure: Secondary | ICD-10-CM | POA: Insufficient documentation

## 2013-01-06 DIAGNOSIS — I4891 Unspecified atrial fibrillation: Secondary | ICD-10-CM | POA: Insufficient documentation

## 2013-01-06 DIAGNOSIS — I10 Essential (primary) hypertension: Secondary | ICD-10-CM

## 2013-01-06 DIAGNOSIS — H9190 Unspecified hearing loss, unspecified ear: Secondary | ICD-10-CM | POA: Insufficient documentation

## 2013-01-06 DIAGNOSIS — R0602 Shortness of breath: Secondary | ICD-10-CM | POA: Diagnosis not present

## 2013-01-06 DIAGNOSIS — IMO0002 Reserved for concepts with insufficient information to code with codable children: Secondary | ICD-10-CM | POA: Insufficient documentation

## 2013-01-06 DIAGNOSIS — Z87891 Personal history of nicotine dependence: Secondary | ICD-10-CM | POA: Insufficient documentation

## 2013-01-06 DIAGNOSIS — I480 Paroxysmal atrial fibrillation: Secondary | ICD-10-CM | POA: Diagnosis present

## 2013-01-06 DIAGNOSIS — G4733 Obstructive sleep apnea (adult) (pediatric): Secondary | ICD-10-CM | POA: Insufficient documentation

## 2013-01-06 DIAGNOSIS — I2699 Other pulmonary embolism without acute cor pulmonale: Secondary | ICD-10-CM

## 2013-01-06 LAB — CBC WITH DIFFERENTIAL/PLATELET
Basophils Absolute: 0 10*3/uL (ref 0.0–0.1)
Basophils Relative: 1 % (ref 0–1)
Eosinophils Absolute: 0.1 10*3/uL (ref 0.0–0.7)
Eosinophils Relative: 2 % (ref 0–5)
HCT: 41.8 % (ref 39.0–52.0)
Hemoglobin: 13.9 g/dL (ref 13.0–17.0)
Lymphocytes Relative: 30 % (ref 12–46)
Lymphs Abs: 2 10*3/uL (ref 0.7–4.0)
MCH: 28.7 pg (ref 26.0–34.0)
MCHC: 33.3 g/dL (ref 30.0–36.0)
MCV: 86.4 fL (ref 78.0–100.0)
Monocytes Absolute: 0.6 10*3/uL (ref 0.1–1.0)
Monocytes Relative: 9 % (ref 3–12)
Neutro Abs: 3.9 10*3/uL (ref 1.7–7.7)
Neutrophils Relative %: 58 % (ref 43–77)
Platelets: 172 10*3/uL (ref 150–400)
RBC: 4.84 MIL/uL (ref 4.22–5.81)
RDW: 15 % (ref 11.5–15.5)
WBC: 6.7 10*3/uL (ref 4.0–10.5)

## 2013-01-06 LAB — COMPREHENSIVE METABOLIC PANEL
ALT: 13 U/L (ref 0–53)
AST: 18 U/L (ref 0–37)
Albumin: 4 g/dL (ref 3.5–5.2)
Alkaline Phosphatase: 87 U/L (ref 39–117)
BUN: 18 mg/dL (ref 6–23)
CO2: 27 mEq/L (ref 19–32)
Calcium: 9.1 mg/dL (ref 8.4–10.5)
Chloride: 102 mEq/L (ref 96–112)
Creatinine, Ser: 1.16 mg/dL (ref 0.50–1.35)
GFR calc Af Amer: 71 mL/min — ABNORMAL LOW (ref >90)
GFR calc non Af Amer: 62 mL/min — ABNORMAL LOW (ref >90)
Glucose, Bld: 110 mg/dL — ABNORMAL HIGH (ref 70–99)
Potassium: 4 mEq/L (ref 3.5–5.1)
Sodium: 142 mEq/L (ref 135–145)
Total Bilirubin: 0.7 mg/dL (ref 0.3–1.2)
Total Protein: 7.4 g/dL (ref 6.0–8.3)

## 2013-01-06 LAB — TSH: TSH: 1.686 u[IU]/mL (ref 0.350–4.500)

## 2013-01-06 LAB — PROTIME-INR
INR: 2.04 — ABNORMAL HIGH (ref 0.00–1.49)
Prothrombin Time: 22.4 seconds — ABNORMAL HIGH (ref 11.6–15.2)

## 2013-01-06 LAB — PRO B NATRIURETIC PEPTIDE: Pro B Natriuretic peptide (BNP): 92 pg/mL (ref 0–125)

## 2013-01-06 LAB — POCT INR: INR: 2.4

## 2013-01-06 LAB — TROPONIN I
Troponin I: <0.3 ng/mL (ref <0.30)
Troponin I: <0.3 ng/mL (ref <0.30)

## 2013-01-06 LAB — APTT: aPTT: 57 seconds — ABNORMAL HIGH (ref 24–37)

## 2013-01-06 MED ORDER — POTASSIUM CHLORIDE CRYS ER 20 MEQ PO TBCR
40.0000 meq | EXTENDED_RELEASE_TABLET | Freq: Two times a day (BID) | ORAL | Status: DC
  Administered 2013-01-06 – 2013-01-07 (×2): 40 meq via ORAL
  Filled 2013-01-06 (×3): qty 2

## 2013-01-06 MED ORDER — ALBUTEROL SULFATE HFA 108 (90 BASE) MCG/ACT IN AERS: 2.0000 | INHALATION_SPRAY | Freq: Four times a day (QID) | RESPIRATORY_TRACT | Status: DC | PRN

## 2013-01-06 MED ORDER — ACETAMINOPHEN 325 MG PO TABS: 650.0000 mg | ORAL_TABLET | ORAL | Status: DC | PRN

## 2013-01-06 MED ORDER — ONDANSETRON HCL 4 MG/2ML IJ SOLN: 4.0000 mg | Freq: Four times a day (QID) | INTRAMUSCULAR | Status: DC | PRN

## 2013-01-06 MED ORDER — LORATADINE 10 MG PO TABS
10.0000 mg | ORAL_TABLET | Freq: Every day | ORAL | Status: DC | PRN
  Filled 2013-01-06: qty 1

## 2013-01-06 MED ORDER — SPIRONOLACTONE 25 MG PO TABS
25.0000 mg | ORAL_TABLET | Freq: Every day | ORAL | Status: DC
  Filled 2013-01-06: qty 1

## 2013-01-06 MED ORDER — ATENOLOL 25 MG PO TABS
25.0000 mg | ORAL_TABLET | Freq: Every day | ORAL | Status: DC
  Administered 2013-01-07: 25 mg via ORAL
  Filled 2013-01-06: qty 1

## 2013-01-06 MED ORDER — IOHEXOL 350 MG/ML SOLN
100.0000 mL | Freq: Once | INTRAVENOUS | Status: AC | PRN
  Administered 2013-01-06: 100 mL via INTRAVENOUS

## 2013-01-06 MED ORDER — ATORVASTATIN CALCIUM 20 MG PO TABS
20.0000 mg | ORAL_TABLET | Freq: Every day | ORAL | Status: DC
  Administered 2013-01-06: 20 mg via ORAL
  Filled 2013-01-06 (×2): qty 1

## 2013-01-06 MED ORDER — FLUTICASONE PROPIONATE 50 MCG/ACT NA SUSP: 2.0000 | Freq: Every day | NASAL | Status: DC | PRN

## 2013-01-06 MED ORDER — TAMSULOSIN HCL 0.4 MG PO CAPS
0.4000 mg | ORAL_CAPSULE | Freq: Every day | ORAL | Status: DC
  Filled 2013-01-06: qty 1

## 2013-01-06 MED ORDER — PANTOPRAZOLE SODIUM 40 MG PO TBEC
40.0000 mg | DELAYED_RELEASE_TABLET | Freq: Two times a day (BID) | ORAL | Status: DC
  Administered 2013-01-06 – 2013-01-07 (×2): 40 mg via ORAL
  Filled 2013-01-06 (×2): qty 1

## 2013-01-06 MED ORDER — RANOLAZINE ER 500 MG PO TB12
500.0000 mg | ORAL_TABLET | Freq: Two times a day (BID) | ORAL | Status: DC
  Administered 2013-01-06 – 2013-01-07 (×2): 500 mg via ORAL
  Filled 2013-01-06 (×3): qty 1

## 2013-01-06 MED ORDER — SODIUM CHLORIDE 0.9 % IJ SOLN
3.0000 mL | Freq: Two times a day (BID) | INTRAMUSCULAR | Status: DC
  Administered 2013-01-06 – 2013-01-07 (×2): 3 mL via INTRAVENOUS

## 2013-01-06 MED ORDER — NITROGLYCERIN 0.4 MG SL SUBL: 0.4000 mg | SUBLINGUAL_TABLET | SUBLINGUAL | Status: DC | PRN

## 2013-01-06 MED ORDER — MONTELUKAST SODIUM 10 MG PO TABS
10.0000 mg | ORAL_TABLET | Freq: Every day | ORAL | Status: DC
  Administered 2013-01-06: 10 mg via ORAL
  Filled 2013-01-06 (×2): qty 1

## 2013-01-06 MED ORDER — OXYCODONE-ACETAMINOPHEN 5-325 MG PO TABS
1.0000 | ORAL_TABLET | Freq: Four times a day (QID) | ORAL | Status: DC | PRN
  Administered 2013-01-06 – 2013-01-07 (×2): 2 via ORAL
  Filled 2013-01-06 (×2): qty 2

## 2013-01-06 MED ORDER — IRBESARTAN 150 MG PO TABS
150.0000 mg | ORAL_TABLET | Freq: Every day | ORAL | Status: DC
  Filled 2013-01-06: qty 1

## 2013-01-06 MED ORDER — SODIUM CHLORIDE 0.9 % IV SOLN: 250.0000 mL | INTRAVENOUS | Status: DC | PRN

## 2013-01-06 MED ORDER — SODIUM CHLORIDE 0.9 % IJ SOLN: 3.0000 mL | INTRAMUSCULAR | Status: DC | PRN

## 2013-01-06 MED ORDER — TRAMADOL HCL 50 MG PO TABS: 50.0000 mg | ORAL_TABLET | Freq: Four times a day (QID) | ORAL | Status: DC | PRN

## 2013-01-06 MED ORDER — FINASTERIDE 5 MG PO TABS
5.0000 mg | ORAL_TABLET | Freq: Every day | ORAL | Status: DC
  Administered 2013-01-07: 5 mg via ORAL
  Filled 2013-01-06: qty 1

## 2013-01-06 MED ORDER — FUROSEMIDE 80 MG PO TABS
80.0000 mg | ORAL_TABLET | Freq: Two times a day (BID) | ORAL | Status: DC
  Administered 2013-01-06 – 2013-01-07 (×2): 80 mg via ORAL
  Filled 2013-01-06 (×4): qty 1

## 2013-01-06 NOTE — Progress Notes (Signed)
[address] [place], [place]  [address] Phone: [phone] Fax:  [phone]  Date:  01/06/2013   ID:  GEORGI NAVARRETE, DOB 10/17/1940, MRN [id number]  PCP:  [name], MD  Cardiologist:  Dr. Peter [name]     History of Present Illness: Carlos Dawson is a 72 y.o. male with a hx of CAD, s/p BMS to the OM2 in 09/2010, diastolic CHF, AFib, thoracic aortic aneurysm, prior pulmonary emboli (in setting of Achilles tendon repair and period of immobilization), morbid obesity s/p prior lap banding, HTN, HL, OSA.  Echo (02/2010):  Limited study, severe LVH, EF 55%, Ao root 5cm. LHC (10/2010):  Separate ostia for CFX and LAD, Int Br (OM) stent patent, no significant CAD elsewhere.  Chest CTA (08/2012):  Ascending thoracic aortic aneurysm 5 cm, RML 4 mm nodule, 1.9 cm mesenteric lymph node.  Last seen by Dr. [name] for f/u on thoracic aneurysm in 08/2012.   I saw him earlier this month for follow up.  He complained of exertional chest pain and I recommended a LexiScan Myoview.  This has not been done yet.  He complained of fairly chronic and stable dyspnea.  Six month f/u was recommended.  He went to urgent care recently and was told he had evidence of CHF on a CXR.   His chest discomfort has gotten worse since last seen. He tells me that his CP became more frequent with less activity.  It is now present at rest.  It is made worse with certain movements, sneezing or deep breaths.  His pain radiates around under his right breast to his back.  He notes significant dyspnea with minimal activity.  He is now NYHA Class 3.  He denies syncope.  He denies orthopnea, PND.  His pedal edema is stable.  His weights are stable.  As noted, he went to urgent care last week.  There was a question of worsening CHF on a CXR.  We are waiting on those records.    Recent Labs: 05/21/2012: Hemoglobin 13.5; Potassium 3.9  09/02/2012: Creatinine 1.04 12/25/2012:  LDL 45, Hgb 13.1, K 4, creatinine 1.1, ALT  19 01/06/2013:  INR 2.5  Wt Readings from Last 3 Encounters:  01/06/13 277 lb 1.9 oz (125.701 kg)  12/26/12 275 lb (124.739 kg)  12/24/12 285 lb (129.275 kg)     Past Medical History  Diagnosis Date  . Hypertension   . Obesities, morbid   . Pulmonary embolism     2008  . Anticoagulant long-term use   . CAD (coronary artery disease)     s/p BMS to 2nd OM in Sept 2012  . Thoracic aortic aneurysm     followed by Dr. [name]; last CT November 11, 2010 and unchanged  . OA (osteoarthritis)   . LVH (left ventricular hypertrophy)   . Colonic polyp   . Mild intermittent asthma   . Hemorrhoids   . Generalized headaches   . SOB (shortness of breath)     using oxygen with exercise  . Diastolic dysfunction   . ASCVD (arteriosclerotic cardiovascular disease)     Prior BMS to the 2nd OM in September 2012; with repeat cath in October showing patency  . GERD (gastroesophageal reflux disease)   . IBS (irritable bowel syndrome)   . Aortic root enlargement   . Ascending aortic aneurysm     recent scan in October 2012 showing no change; followed by Dr. [name]  . PAF (paroxysmal atrial fibrillation)  treated with Coumadin  . Hearing loss   . Contact lens/glasses fitting   . OSA (obstructive sleep apnea)     PSG 03/30/97 AHI 21, BPAP 13/9  . Sleep apnea     Past Surgical History  Procedure Laterality Date  . Hemorroidectomy    . Vasectomy    . Cervical spine surgery  06/02/2010    lower back and neck  . Gastric bypass  05/27/2007  . Cholecystectomy    . Rotator cuff repair      both  . Achilles tendon repair    . Cardiac catheterization  2006  . Coronary stent placement  Sept 2012    2nd OM with BMS  . Cardiac catheterization  October 2012    Stent patent  . Appendectomy    . Eye surgery      bilateral cataract  . Laminectomy  05/30/2012    L 4 L5     Current Outpatient Prescriptions  Medication Sig Dispense Refill  . albuterol (PROVENTIL HFA;VENTOLIN HFA) 108 (90  BASE) MCG/ACT inhaler Inhale 2 puffs into the lungs every 6 (six) hours as needed for wheezing.      Marland Kitchen atenolol (TENORMIN) 25 MG tablet Take 25 mg by mouth daily.      . cetirizine (ZYRTEC) 10 MG tablet Take 10 mg by mouth daily.      . Cholecalciferol (VITAMIN D) 2000 UNITS CAPS Take by mouth 2 (two) times daily.      . finasteride (PROSCAR) 5 MG tablet Take 5 mg by mouth daily.      . fluticasone (FLONASE) 50 MCG/ACT nasal spray Place 2 sprays into the nose daily as needed for rhinitis.      . furosemide (LASIX) 80 MG tablet Take 80 mg by mouth 2 (two) times daily.      Marland Kitchen KLOR-CON M20 20 MEQ tablet TAKE 2 TABLETS BY MOUTH TWICE A DAY  120 tablet  5  . montelukast (SINGULAIR) 10 MG tablet Take 10 mg by mouth at bedtime.      . nitroGLYCERIN (NITROSTAT) 0.4 MG SL tablet Place 0.4 mg under the tongue every 5 (five) minutes as needed for chest pain.      . pantoprazole (PROTONIX) 40 MG tablet TAKE 1 TABLET BY MOUTH EVERY DAY  30 tablet  6  . RANEXA 500 MG 12 hr tablet TAKE 1 TABLET BY MOUTH TWICE A DAY  60 tablet  5  . rosuvastatin (CRESTOR) 10 MG tablet Take 10 mg by mouth daily.      Marland Kitchen spironolactone (ALDACTONE) 25 MG tablet Take 25 mg by mouth daily.      Marland Kitchen spironolactone (ALDACTONE) 25 MG tablet TAKE 1 TABLET BY MOUTH EVERY DAY  30 tablet  5  . tamsulosin (FLOMAX) 0.4 MG CAPS Take 0.4 mg by mouth daily.      . traMADol (ULTRAM) 50 MG tablet Take 50 mg by mouth every 6 (six) hours as needed for pain.      . valsartan (DIOVAN) 160 MG tablet Take 160 mg by mouth daily.      . VENTOLIN HFA 108 (90 BASE) MCG/ACT inhaler INHALE 2 PUFFS INTO LUNGS EVERY 6 HOURS AS NEEDED FOR WHEEZING  18 each  2  . warfarin (COUMADIN) 5 MG tablet TAKE AS DIRECTED BY ANTICOAGULATION CLINIC  60 tablet  2   No current facility-administered medications for this visit.    Allergies:   Morphine and Adhesive   Social History:  The patient  reports  that he quit smoking about 20 years ago. He has never used smokeless  tobacco. He reports that he does not drink alcohol or use illicit drugs.   Family History:  The patient's family history includes Diabetes in his mother; Emphysema (age of onset: 79) in his father; Heart attack in his sister; Heart disease in his mother; Other in his mother.   ROS:  Please see the history of present illness.  He has had some pain with swallowing. He's had difficulty with dysphagia since his LapBand procedure. There has been no change. He has been nauseated but denies vomiting. He's had some loose stools. He denies fevers, chills, cough.  All other systems reviewed and negative.   PHYSICAL EXAM: VS:  BP 142/74  Pulse 65  Ht 5\' 7"  (1.702 m)  Wt 277 lb 1.9 oz (125.701 kg)  BMI 43.39 kg/m2  SpO2 94% Well nourished, well developed, in no acute distress HEENT: normal Neck: no JVD Cardiac:  normal S1, S2; RRR; no murmur Chest:  No tenderness to palpation Lungs:  clear to auscultation bilaterally, no wheezing, rhonchi or rales Abd: soft, nontender, no hepatomegaly Ext: trace bilateral LE edema Skin: warm and dry; no rash on chest Neuro:  CNs 2-12 intact, no focal abnormalities noted  EKG:  NSR, HR 65, normal axis, PACs, no change from prior tracing  ASSESSMENT AND PLAN:  1. Chest Pain:  The patient presents to the office with constant 7-8/10 chest pain with typical and atypical features.  Possible etiologies for his symptoms include worsening CAD/ACS, pulmonary embolism, thoracic aneurysm leak, complications with LapBand, GERD, pneumonia, chest wall pain.  His INR is 2.5 today making it less likely he has had a recurrent PE.  He does not appear volume overloaded and therefore a/c CHF also seems less likely.  I have reviewed his case with Dr. Zackery Barefoot (DOD) who also saw the patient.  We will place him in observation.  Will plan on stat CTA of the chest and abdomen.  Hold coumadin in case he needs cardiac cath.  Obtain serial CEs.  Check BNP along with labs.  Increase PPI to  bid.  Consider asking general surgery to see him for his LapBand if cardiac workup is unremarkable.   2. CAD:  Admit as noted above and check serial CEs.  If CTA is negative for aneurysm leak, he will need IV heparin once INR < 2.  Further recommendations to follow. 3. Chronic Diastolic CHF:  Volume appears stable.  Will check BNP and give IV Lasix if needed.   4. Paroxysmal Atrial Fibrillation:  Maintaining NSR.  He is tolerating coumadin.  The coumadin will be held.  If CTA does not show leaking aneurysm, start IV heparin once INR < 2.   5. Ascending Thoracic Aortic Aneurysm:  Plan CTA of Chest and Abdomen as noted. 6. Hypertension:  Controlled. 7. Hyperlipidemia:  Continue statin. He gets labs from [facility].  8. Disposition:  He will be admitted to [facility] today.    Signed, Tereso Newcomer, PA-C  01/06/2013 9:50 AM

## 2013-01-06 NOTE — Telephone Encounter (Signed)
Admitted from office today. Tereso Newcomer, PA-C   01/06/2013 11:36 AM

## 2013-01-06 NOTE — H&P (Signed)
History and Physical   Date:  01/06/2013   ID:  Carlos Dawson, DOB [birth year], MRN [id number]  PCP:  [name], MD  Cardiologist:  Dr. Peter [name]     History of Present Illness: Carlos Dawson is a 72 y.o. male with a hx of CAD, s/p BMS to the OM2 in 09/2010, diastolic CHF, AFib, thoracic aortic aneurysm, prior pulmonary emboli (in setting of Achilles tendon repair and period of immobilization), morbid obesity s/p prior lap banding, HTN, HL, OSA.  Echo (02/2010):  Limited study, severe LVH, EF 55%, Ao root 5cm. LHC (10/2010):  Separate ostia for CFX and LAD, Int Br (OM) stent patent, no significant CAD elsewhere.  Chest CTA (08/2012):  Ascending thoracic aortic aneurysm 5 cm, RML 4 mm nodule, 1.9 cm mesenteric lymph node.  Last seen by Dr. [name] for f/u on thoracic aneurysm in 08/2012.   I saw him earlier this month for follow up.  He complained of exertional chest pain and I recommended a LexiScan Myoview.  This has not been done yet.  He complained of fairly chronic and stable dyspnea.  Six month f/u was recommended.  He went to urgent care recently and was told he had evidence of CHF on a CXR.   His chest discomfort has gotten worse since last seen. He tells me that his CP became more frequent with less activity.  It is now present at rest.  It is made worse with certain movements, sneezing or deep breaths.  His pain radiates around under his right breast to his back.  He notes significant dyspnea with minimal activity.  He is now NYHA Class 3.  He denies syncope.  He denies orthopnea, PND.  His pedal edema is stable.  His weights are stable.  As noted, he went to urgent care last week.  There was a question of worsening CHF on a CXR.  We are waiting on those records.    Recent Labs: 05/21/2012: Hemoglobin 13.5; Potassium 3.9  09/02/2012: Creatinine 1.04 12/25/2012:  LDL 45, Hgb 13.1, K 4, creatinine 1.1, ALT 19 01/06/2013:  INR 2.5  Wt Readings from Last 3 Encounters:   01/06/13 277 lb 1.9 oz (125.701 kg)  12/26/12 275 lb (124.739 kg)  12/24/12 285 lb (129.275 kg)     Past Medical History  Diagnosis Date  . Hypertension   . Obesities, morbid   . Pulmonary embolism     2008  . Anticoagulant long-term use   . CAD (coronary artery disease)     s/p BMS to 2nd OM in Sept 2012  . Thoracic aortic aneurysm     followed by Dr. [name]; last CT November 11, 2010 and unchanged  . OA (osteoarthritis)   . LVH (left ventricular hypertrophy)   . Colonic polyp   . Mild intermittent asthma   . Hemorrhoids   . Generalized headaches   . SOB (shortness of breath)     using oxygen with exercise  . Diastolic dysfunction   . ASCVD (arteriosclerotic cardiovascular disease)     Prior BMS to the 2nd OM in September 2012; with repeat cath in October showing patency  . GERD (gastroesophageal reflux disease)   . IBS (irritable bowel syndrome)   . Aortic root enlargement   . Ascending aortic aneurysm     recent scan in October 2012 showing no change; followed by Dr. [name]  . PAF (paroxysmal atrial fibrillation)     treated with Coumadin  . Hearing loss   .  Contact lens/glasses fitting   . OSA (obstructive sleep apnea)     PSG 03/30/97 AHI 21, BPAP 13/9  . Sleep apnea     Past Surgical History  Procedure Laterality Date  . Hemorroidectomy    . Vasectomy    . Cervical spine surgery  06/02/2010    lower back and neck  . Gastric bypass  05/27/2007  . Cholecystectomy    . Rotator cuff repair      both  . Achilles tendon repair    . Cardiac catheterization  2006  . Coronary stent placement  Sept 2012    2nd OM with BMS  . Cardiac catheterization  October 2012    Stent patent  . Appendectomy    . Eye surgery      bilateral cataract  . Laminectomy  05/30/2012    L 4 L5     Current Outpatient Prescriptions  Medication Sig Dispense Refill  . albuterol (PROVENTIL HFA;VENTOLIN HFA) 108 (90 BASE) MCG/ACT inhaler Inhale 2 puffs into the lungs every 6 (six)  hours as needed for wheezing.      Marland Kitchen atenolol (TENORMIN) 25 MG tablet Take 25 mg by mouth daily.      . cetirizine (ZYRTEC) 10 MG tablet Take 10 mg by mouth daily.      . Cholecalciferol (VITAMIN D) 2000 UNITS CAPS Take by mouth 2 (two) times daily.      . finasteride (PROSCAR) 5 MG tablet Take 5 mg by mouth daily.      . fluticasone (FLONASE) 50 MCG/ACT nasal spray Place 2 sprays into the nose daily as needed for rhinitis.      . furosemide (LASIX) 80 MG tablet Take 80 mg by mouth 2 (two) times daily.      Marland Kitchen KLOR-CON M20 20 MEQ tablet TAKE 2 TABLETS BY MOUTH TWICE A DAY  120 tablet  5  . montelukast (SINGULAIR) 10 MG tablet Take 10 mg by mouth at bedtime.      . nitroGLYCERIN (NITROSTAT) 0.4 MG SL tablet Place 0.4 mg under the tongue every 5 (five) minutes as needed for chest pain.      . pantoprazole (PROTONIX) 40 MG tablet TAKE 1 TABLET BY MOUTH EVERY DAY  30 tablet  6  . RANEXA 500 MG 12 hr tablet TAKE 1 TABLET BY MOUTH TWICE A DAY  60 tablet  5  . rosuvastatin (CRESTOR) 10 MG tablet Take 10 mg by mouth daily.      Marland Kitchen spironolactone (ALDACTONE) 25 MG tablet Take 25 mg by mouth daily.      Marland Kitchen spironolactone (ALDACTONE) 25 MG tablet TAKE 1 TABLET BY MOUTH EVERY DAY  30 tablet  5  . tamsulosin (FLOMAX) 0.4 MG CAPS Take 0.4 mg by mouth daily.      . traMADol (ULTRAM) 50 MG tablet Take 50 mg by mouth every 6 (six) hours as needed for pain.      . valsartan (DIOVAN) 160 MG tablet Take 160 mg by mouth daily.      . VENTOLIN HFA 108 (90 BASE) MCG/ACT inhaler INHALE 2 PUFFS INTO LUNGS EVERY 6 HOURS AS NEEDED FOR WHEEZING  18 each  2  . warfarin (COUMADIN) 5 MG tablet TAKE AS DIRECTED BY ANTICOAGULATION CLINIC  60 tablet  2   No current facility-administered medications for this visit.    Allergies:   Morphine and Adhesive   Social History:  The patient  reports that he quit smoking about 20 years ago. He has  never used smokeless tobacco. He reports that he does not drink alcohol or use illicit  drugs.   Family History:  The patient's family history includes Diabetes in his mother; Emphysema (age of onset: 69) in his father; Heart attack in his sister; Heart disease in his mother; Other in his mother.   ROS:  Please see the history of present illness.  He has had some pain with swallowing. He's had difficulty with dysphagia since his LapBand procedure. There has been no change. He has been nauseated but denies vomiting. He's had some loose stools. He denies fevers, chills, cough.  All other systems reviewed and negative.   PHYSICAL EXAM: VS:  BP 142/74  Pulse 65  Ht 5\' 7"  (1.702 m)  Wt 277 lb 1.9 oz (125.701 kg)  BMI 43.39 kg/m2  SpO2 94% Well nourished, well developed, in no acute distress HEENT: normal Neck: no JVD Cardiac:  normal S1, S2; RRR; no murmur Chest:  No tenderness to palpation Lungs:  clear to auscultation bilaterally, no wheezing, rhonchi or rales Abd: soft, nontender, no hepatomegaly Ext: trace bilateral LE edema Skin: warm and dry; no rash on chest Neuro:  CNs 2-12 intact, no focal abnormalities noted  EKG:  NSR, HR 65, normal axis, PACs, no change from prior tracing  ASSESSMENT AND PLAN:  1. Chest Pain:  The patient presents to the office with constant 7-8/10 chest pain with typical and atypical features.  Possible etiologies for his symptoms include worsening CAD/ACS, pulmonary embolism, thoracic aneurysm leak, complications with LapBand, GERD, pneumonia, chest wall pain.  His INR is 2.5 today making it less likely he has had a recurrent PE.  He does not appear volume overloaded and therefore a/c CHF also seems less likely.  I have reviewed his case with Dr. Zackery Barefoot (DOD) who also saw the patient.  We will place him in observation.  Will plan on stat CTA of the chest and abdomen.  Hold coumadin in case he needs cardiac cath.  Obtain serial CEs.  Check BNP along with labs.  Increase PPI to bid.  Consider asking general surgery to see him for his LapBand if  cardiac workup is unremarkable.   2. CAD:  Admit as noted above and check serial CEs.  If CTA is negative for aneurysm leak, he will need IV heparin once INR < 2.  Further recommendations to follow. 3. Chronic Diastolic CHF:  Volume appears stable.  Will check BNP and give IV Lasix if needed.   4. Paroxysmal Atrial Fibrillation:  Maintaining NSR.  He is tolerating coumadin.  The coumadin will be held.  If CTA does not show leaking aneurysm, start IV heparin once INR < 2.   5. Ascending Thoracic Aortic Aneurysm:  Plan CTA of Chest and Abdomen as noted. 6. Hypertension:  Controlled. 7. Hyperlipidemia:  Continue statin. He gets labs from [facility].  8. Disposition:  He will be admitted to [facility] today.    Signed, Tereso Newcomer, PA-C  01/06/2013 9:50 AM   Patient seen and examined. I agree with the assessment and plan as detailed above. See also my additional thoughts below.   I reviewed all of the information carefully with Carlos Dawson. I'm concerned about the patient's current symptoms. However it is not clear what is causing his major symptom. We need to be sure that there is no evidence of a leaking aneurysm. We have to consider whether his coronary disease is unstable. We have to decide if his lap band is  causing symptoms. It seems unlikely that he would have recurrent pulmonary emboli with full anticoagulation. He is fully anticoagulated with Coumadin at this time. It will be held on admission. He needs to be fully re\re anticoagulated unless we find some evidence of bleeding. His volume status will be watched carefully.   Willa Rough, MD, [facility] 01/06/2013 1:45 PM

## 2013-01-07 ENCOUNTER — Other Ambulatory Visit: Payer: Self-pay | Admitting: Physician Assistant

## 2013-01-07 DIAGNOSIS — Z7901 Long term (current) use of anticoagulants: Secondary | ICD-10-CM

## 2013-01-07 DIAGNOSIS — R935 Abnormal findings on diagnostic imaging of other abdominal regions, including retroperitoneum: Secondary | ICD-10-CM

## 2013-01-07 DIAGNOSIS — I712 Thoracic aortic aneurysm, without rupture, unspecified: Secondary | ICD-10-CM

## 2013-01-07 DIAGNOSIS — I2510 Atherosclerotic heart disease of native coronary artery without angina pectoris: Secondary | ICD-10-CM

## 2013-01-07 LAB — BASIC METABOLIC PANEL
BUN: 19 mg/dL (ref 6–23)
CO2: 28 mEq/L (ref 19–32)
Calcium: 8.6 mg/dL (ref 8.4–10.5)
Chloride: 104 mEq/L (ref 96–112)
Creatinine, Ser: 1.22 mg/dL (ref 0.50–1.35)
GFR calc Af Amer: 67 mL/min — ABNORMAL LOW (ref >90)
GFR calc non Af Amer: 58 mL/min — ABNORMAL LOW (ref >90)
Glucose, Bld: 99 mg/dL (ref 70–99)
Potassium: 3.8 mEq/L (ref 3.5–5.1)
Sodium: 140 mEq/L (ref 135–145)

## 2013-01-07 LAB — LIPID PANEL
Cholesterol: 118 mg/dL (ref 0–200)
HDL: 40 mg/dL (ref >39)
LDL Cholesterol: 51 mg/dL (ref 0–99)
Total CHOL/HDL Ratio: 3 RATIO
Triglycerides: 133 mg/dL (ref <150)
VLDL: 27 mg/dL (ref 0–40)

## 2013-01-07 LAB — TROPONIN I: Troponin I: <0.3 ng/mL (ref <0.30)

## 2013-01-07 MED ORDER — OXYCODONE-ACETAMINOPHEN 5-325 MG PO TABS: 1.0000 | ORAL_TABLET | Freq: Four times a day (QID) | ORAL | Status: DC | PRN

## 2013-01-07 MED ORDER — WARFARIN SODIUM 5 MG PO TABS
5.0000 mg | ORAL_TABLET | Freq: Once | ORAL | Status: DC
  Filled 2013-01-07: qty 1

## 2013-01-07 NOTE — Progress Notes (Addendum)
Patient Name: Carlos Dawson Date of Encounter: 01/07/2013  Active Problems:   Hypertension   Anticoagulant long-term use   CAD (coronary artery disease)   Thoracic aortic aneurysm   Paroxysmal atrial fibrillation   Lapband APL May 2009   Chronic diastolic heart failure   Chest pain at rest    SUBJECTIVE: Still c/o right chest pain. Has tenderness over the posterior/lateral right ribs. Breathing OK.  OBJECTIVE Filed Vitals:   01/06/13 1300 01/06/13 1911 01/06/13 1923 01/07/13 0517  BP: 128/69 84/54 114/72 102/72  Pulse: 49 65  69  Temp: 98.2 F (36.8 C) 97.9 F (36.6 C)  97.8 F (36.6 C)  TempSrc: Oral Oral  Oral  Resp: 15 16  18   Height: 5\' 7"  (1.702 m)     Weight: 276 lb (125.193 kg)   272 lb 11.2 oz (123.696 kg)  SpO2: 99% 97%  97%    Intake/Output Summary (Last 24 hours) at 01/07/13 0941 Last data filed at 01/07/13 0524  Gross per 24 hour  Intake    240 ml  Output   1150 ml  Net   -910 ml   Filed Weights   01/06/13 1300 01/07/13 0517  Weight: 276 lb (125.193 kg) 272 lb 11.2 oz (123.696 kg)    PHYSICAL EXAM General: Well developed, well nourished, male in no acute distress. Head: Normocephalic, atraumatic.  Neck: Supple without bruits, JVD. Lungs:  Resp regular and unlabored, CTA except for a few rales. Heart: RRR, S1, S2, no S3, S4, 2/6 murmur; no rub. Abdomen: Soft, non-tender, non-distended, BS + x 4.  Extremities: No clubbing, cyanosis, no edema.  Neuro: Alert and oriented X 3. Moves all extremities spontaneously. Psych: Normal affect.  LABS: CBC: Recent Labs  01/06/13 1215  WBC 6.7  NEUTROABS 3.9  HGB 13.9  HCT 41.8  MCV 86.4  PLT 172   INR: Recent Labs  01/06/13 1215  INR 2.04*   Basic Metabolic Panel: Recent Labs  01/06/13 1215 01/07/13 0410  NA 142 140  K 4.0 3.8  CL 102 104  CO2 27 28  GLUCOSE 110* 99  BUN 18 19  CREATININE 1.16 1.22  CALCIUM 9.1 8.6   Liver Function Tests: Recent Labs  01/06/13 1215    AST 18  ALT 13  ALKPHOS 87  BILITOT 0.7  PROT 7.4  ALBUMIN 4.0   Cardiac Enzymes: Recent Labs  01/06/13 1143 01/06/13 1723 01/06/13 2340  TROPONINI <0.30 <0.30 <0.30   BNP: Pro B Natriuretic peptide (BNP)  Date/Time Value Range Status  01/06/2013 11:43 AM 92.0  0 - 125 pg/mL Final  11/11/2010  3:12 PM 82.0  0.0 - 100.0 pg/mL Final   Fasting Lipid Panel: Recent Labs  01/07/13 0410  CHOL 118  HDL 40  LDLCALC 51  TRIG 133  CHOLHDL 3.0   Thyroid Function Tests: Recent Labs  01/06/13 1215  TSH 1.686   TELE:  SR, PACs      Radiology/Studies: Ct Angio Chest Aorta W/cm &/or Wo/cm 01/06/2013   CLINICAL DATA:  Aneurysm.  Pain.  EXAM: CT ANGIOGRAPHY CHEST, ABDOMEN AND PELVIS  TECHNIQUE: Multidetector CT imaging through the chest, abdomen and pelvis was performed using the standard protocol during bolus administration of intravenous contrast. Multiplanar reconstructed images including MIPs were obtained and reviewed to evaluate the vascular anatomy.  CONTRAST:  OMNIPAQUE IOHEXOL 350 MG/ML SOLN  COMPARISON:  CT 09/03/2012.  FINDINGS: CTA CHEST FINDINGS  Ascending aorta is again noted to be mildly  aneurysmal and measures 5 cm in maximum diameter. This is unchanged. Aortic arch and descending thoracic aorta are stable. No evidence of dissection. Great vessels are patent. There is a bovine arch. Heart size is stable. Coronary artery disease present. Pulmonary arteries are unremarkable. The mediastinum and hilar structures are normal with shotty lymph nodes. Thoracic esophagus is unremarkable. Large airways are patent. No focal pulmonary mass lesion noted. Interstitial changes noted most consistent with fibrosis. Pleural thickening is noted most consistent with scarring.  Review of the MIP images confirms the above findings.  CTA ABDOMEN AND PELVIS FINDINGS  Abdominal aorta is normal in caliber. Visceral vessels are patent. Iliac vasculature patent. No focal hepatic abnormality. The  spleen is normal. Pancreas is normal. Cholecystectomy. Adrenals are normal. 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 No hydronephrosis.Prior gastric banding surgery. No bowel distention. Previously identified 18.6 mm soft tissue density in the mid mesenteric region appears to have slightly increased to approximately 22 mm in diameter, image 176/series 4. To exclude a malignancy a PET-CT may prove useful. Degenerative changes noted thoracolumbar spine with mild degenerative retrolisthesis L1 on L2. Postsurgical changes lower lumbar spine.  Review of the MIP images confirms the above findings.  IMPRESSION: 1. Stable 5 cm ascending aortic aneurysm. This is stable from prior CT of 09/03/2012. No dissection. Coronary artery disease. 2. Previously identified soft tissue density in the mid mesenteric region may have increased slightly in size. A PET-CT may prove useful for further evaluation to exclude a malignant mesenteric lymph node or other etiology of mesenteric mass.   Electronically Signed   By: Maisie Fus  Register   On: 01/06/2013 16:42    Current Medications:  . atenolol  25 mg Oral Daily  . atorvastatin  20 mg Oral q1800  . finasteride  5 mg Oral Daily  . furosemide  80 mg Oral BID  . irbesartan  150 mg Oral Daily  . montelukast  10 mg Oral QHS  . pantoprazole  40 mg Oral BID  . potassium chloride SA  40 mEq Oral BID  . ranolazine  500 mg Oral BID  . sodium chloride  3 mL Intravenous Q12H  . spironolactone  25 mg Oral Daily  . tamsulosin  0.4 mg Oral Daily    ASSESSMENT AND PLAN:   Chest pain at rest - Atypical, ez negative for MI, rib tenderness noted. CTA without mention of PE and aortic size is stable. Reviewed with Dr. Register, no rib fractures noted, rib films not needed. Pt has a great deal of degenerative disease in his back. Add Voltaren gel and continue pain Rx. MD advise on NSAIDS.  Active Problems:   Hypertension - OK on current Rx    Anticoagulant long-term use - coumadin held last pm in case  invasive workup needed. ?resume?    CAD (coronary artery disease) - no acute ischemic symptoms    Thoracic aortic aneurysm - size unchanged    Paroxysmal atrial fibrillation - maintaining SR with ectopy, possible WAP, do not think afib.    Lapband APL May 2009 - no complication     Chronic diastolic heart failure - volume status is good, weight lower than previous values.        Hx PE  - reviewed images with Dr. Register, no PE    Mid-mesenteric soft tissue density - larger than on previous scan, Dr. Register strongly recommends further eval with a PET-CT   Signed, Theodore Demark , PA-C 9:41 AM 01/07/2013 Patient seen and examined and history reviewed.  Agree with above findings and plan. Patient continues to have pain in right posterior thorax and ribs. Relieved with Percocet. Was taking tramadol at home without relief. No anterior chest pain. I think his pain is musculoskeletal related to significant spinal arthritis. ? Radicular pain. Cardiac enzymes are all negative. CT results as noted. Aneurysm is unchanged. Mesenteric density needs further evaluation with PET CT as outpatient. He has had colonoscopy in last 2 years.  Will DC home today. He has a Myoview already scheduled on the 30th. Will give Percocet Rx for pain. I pain persists may need referral to pain clinic for ? Nerve block.  Theron Arista [facility] 01/07/2013 11:31 AM

## 2013-01-07 NOTE — Discharge Summary (Signed)
CARDIOLOGY DISCHARGE SUMMARY   Patient ID: Carlos Dawson MRN: [id number] DOB/AGE: 1940/04/29 72 y.o.  Admit date: 01/06/2013 Discharge date: 01/07/2013  PCP: [name], MD Primary Cardiologist: PJ  Primary Discharge Diagnosis:    Chest pain at rest  Secondary Discharge Diagnosis:  Active Problems:   Hypertension   Anticoagulant long-term use   CAD (coronary artery disease)   Thoracic aortic aneurysm   Paroxysmal atrial fibrillation   Lapband APL May 2009   Chronic diastolic heart failure  Procedures:  CTA chest  Hospital Course: Carlos Dawson is a 72 y.o. male with a hx of CAD, s/p BMS to the OM2 in 09/2010, diastolic CHF, AFib, thoracic aortic aneurysm, prior pulmonary emboli (in setting of Achilles tendon repair and period of immobilization), morbid obesity s/p prior lap banding, HTN, HL, OSA. He had chest pain and came to the hospital where he was admitted for further evaluation and treatment.  His cardiac enzymes were negative for MI. He has a history of diastolic CHF, but his volume status was good. Because of his aneurysm, a CTA of the chest/abdomen was performed. It showed no PE and the aneurysm was unchanged. There was a soft tissue density in the mesenteric area, with concern for enlargement. Dr. [name] discussed the results with the patient and his wife. A PET-CT will be performed as an outpatient for further evaluation.  On 12/16, [name] was seen by Dr. [name]. His pain was more consistent with rib pain, helped by narcotic pain medications. His CT showed a great deal of degenerative disease in his back, this may be responsible for his symptoms. [name] has a stress test scheduled in the office. His symptoms are low-risk and he is considered stable for discharge, to follow up as an outpatient.    Labs:   Lab Results  Component Value Date   WBC 6.7 01/06/2013   HGB 13.9 01/06/2013   HCT 41.8 01/06/2013   MCV 86.4 01/06/2013   PLT 172  01/06/2013     Recent Labs Lab 01/06/13 1215 01/07/13 0410  NA 142 140  K 4.0 3.8  CL 102 104  CO2 27 28  BUN 18 19  CREATININE 1.16 1.22  CALCIUM 9.1 8.6  PROT 7.4  --   BILITOT 0.7  --   ALKPHOS 87  --   ALT 13  --   AST 18  --   GLUCOSE 110* 99    Recent Labs  01/06/13 1143 01/06/13 1723 01/06/13 2340  TROPONINI <0.30 <0.30 <0.30   Lipid Panel     Component Value Date/Time   CHOL 118 01/07/2013 0410   TRIG 133 01/07/2013 0410   HDL 40 01/07/2013 0410   CHOLHDL 3.0 01/07/2013 0410   VLDL 27 01/07/2013 0410   LDLCALC 51 01/07/2013 0410    Pro B Natriuretic peptide (BNP)  Date/Time Value Range Status  01/06/2013 11:43 AM 92.0  0 - 125 pg/mL Final  11/11/2010  3:12 PM 82.0  0.0 - 100.0 pg/mL Final    Recent Labs  01/06/13 1215  INR 2.04*      Radiology: Ct Angio Chest Aorta W/cm &/or Wo/cm 01/06/2013 CLINICAL DATA: Aneurysm. Pain. EXAM: CT ANGIOGRAPHY CHEST, ABDOMEN AND PELVIS TECHNIQUE: Multidetector CT imaging through the chest, abdomen and pelvis was performed using the standard protocol during bolus administration of intravenous contrast. Multiplanar reconstructed images including MIPs were obtained and reviewed to evaluate the vascular anatomy. CONTRAST: OMNIPAQUE IOHEXOL 350 MG/ML SOLN COMPARISON: CT  09/03/2012. FINDINGS: CTA CHEST FINDINGS Ascending aorta is again noted to be mildly aneurysmal and measures 5 cm in maximum diameter. This is unchanged. Aortic arch and descending thoracic aorta are stable. No evidence of dissection. Great vessels are patent. There is a bovine arch. Heart size is stable. Coronary artery disease present. Pulmonary arteries are unremarkable. The mediastinum and hilar structures are normal with shotty lymph nodes. Thoracic esophagus is unremarkable. Large airways are patent. No focal pulmonary mass lesion noted. Interstitial changes noted most consistent with fibrosis. Pleural thickening is noted most consistent with  scarring. Review of the MIP images confirms the above findings. CTA ABDOMEN AND PELVIS FINDINGS Abdominal aorta is normal in caliber. Visceral vessels are patent. Iliac vasculature patent. No focal hepatic abnormality. The spleen is normal. Pancreas is normal. Cholecystectomy. Adrenals are normal. 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 No hydronephrosis.Prior gastric banding surgery. No bowel distention. Previously identified 18.6 mm soft tissue density in the mid mesenteric region appears to have slightly increased to approximately 22 mm in diameter, image 176/series 4. To exclude a malignancy a PET-CT may prove useful. Degenerative changes noted thoracolumbar spine with mild degenerative retrolisthesis L1 on L2. Postsurgical changes lower lumbar spine. Review of the MIP images confirms the above findings. IMPRESSION: 1. Stable 5 cm ascending aortic aneurysm. This is stable from prior CT of 09/03/2012. No dissection. Coronary artery disease. 2. Previously identified soft tissue density in the mid mesenteric region may have increased slightly in size. A PET-CT may prove useful for further evaluation to exclude a malignant mesenteric lymph node or other etiology of mesenteric mass. Electronically Signed By: Maisie Fus Register On: 01/06/2013 16:42   EKG:  01/07/2013 Sinus Bradycardia Vent. rate 59 BPM PR interval 248 ms QRS duration 118 ms QT/QTc 446/441 ms P-R-T axes * 19 52  FOLLOW UP PLANS AND APPOINTMENTS Allergies  Allergen Reactions  . Morphine Itching  . Adhesive [Tape] Itching and Rash    Where applied.     Medication List         albuterol 108 (90 BASE) MCG/ACT inhaler  Commonly known as:  PROVENTIL HFA;VENTOLIN HFA  Inhale 2 puffs into the lungs every 6 (six) hours as needed for wheezing.     atenolol 25 MG tablet  Commonly known as:  TENORMIN  Take 25 mg by mouth daily.     cetirizine 10 MG tablet  Commonly known as:  ZYRTEC  Take 10 mg by mouth daily as needed for allergies.      finasteride 5 MG tablet  Commonly known as:  PROSCAR  Take 5 mg by mouth daily.     fluticasone 50 MCG/ACT nasal spray  Commonly known as:  FLONASE  Place 2 sprays into the nose daily as needed for rhinitis.     furosemide 80 MG tablet  Commonly known as:  LASIX  Take 80 mg by mouth 2 (two) times daily.     montelukast 10 MG tablet  Commonly known as:  SINGULAIR  Take 10 mg by mouth at bedtime.     nitroGLYCERIN 0.4 MG SL tablet  Commonly known as:  NITROSTAT  Place 0.4 mg under the tongue every 5 (five) minutes as needed for chest pain.     oxyCODONE-acetaminophen 5-325 MG per tablet  Commonly known as:  PERCOCET/ROXICET  Take 1-2 tablets by mouth every 6 (six) hours as needed for severe pain.     pantoprazole 40 MG tablet  Commonly known as:  PROTONIX  TAKE 1 TABLET BY MOUTH EVERY DAY  potassium chloride SA 20 MEQ tablet  Commonly known as:  K-DUR,KLOR-CON  Take 40 mEq by mouth daily at 12 noon.     RANEXA 500 MG 12 hr tablet  Generic drug:  ranolazine  TAKE 1 TABLET BY MOUTH TWICE A DAY     rosuvastatin 10 MG tablet  Commonly known as:  CRESTOR  Take 10 mg by mouth daily.     spironolactone 25 MG tablet  Commonly known as:  ALDACTONE  Take 25 mg by mouth daily.     tamsulosin 0.4 MG Caps capsule  Commonly known as:  FLOMAX  Take 0.4 mg by mouth daily.     traMADol 50 MG tablet  Commonly known as:  ULTRAM  Take 50 mg by mouth every 6 (six) hours as needed for pain.     valsartan 160 MG tablet  Commonly known as:  DIOVAN  Take 160 mg by mouth daily.     Vitamin D-3 5000 UNITS Tabs  Take 5,000 Units by mouth daily.     warfarin 5 MG tablet  Commonly known as:  COUMADIN  Take 2.5-5 mg by mouth daily at 6 PM. 5 mg every day except 2.5 mg on fridays        Discharge Orders   Future Appointments Provider Department Dept Phone   01/21/2013 7:30 AM [facility] 2 Irven Shelling) [facility] SITE 3 NUCLEAR MED [phone]   01/27/2013  2:30 PM [name] [name], MD [facility] [place] Office [phone]   02/03/2013 1:00 PM [facility] [place] Office [phone]   Future Orders Complete By Expires   Diet - low sodium heart healthy  As directed    Increase activity slowly  As directed      Follow-up Information   Follow up with [facility] On 01/21/2013. (Stress test at 7:30 am. )    Contact information:   [address] [place] [address]       Follow up with Peter [name], MD On 01/27/2013. (at 2:30 pm)    Specialty:  Cardiology   Contact information:   [address] [place] [address] [phone]       BRING ALL MEDICATIONS WITH YOU TO FOLLOW UP APPOINTMENTS  Time spent with patient to include physician time: 35 min Signed: Theodore Demark, PA-C 01/07/2013, 12:08 PM Co-Sign MD

## 2013-01-07 NOTE — Progress Notes (Signed)
Reviewed discharge instructions with patient and wife, they stated their understanding.  Prescription for percocet given to patient.  Colman Cater

## 2013-01-07 NOTE — Discharge Summary (Signed)
Patient seen and examined and history reviewed. Agree with above findings and plan. See my earlier rounding note.  Theron Arista JordanMD 01/07/2013 1:02 PM

## 2013-01-09 ENCOUNTER — Telehealth: Payer: Self-pay | Admitting: Cardiology

## 2013-01-09 DIAGNOSIS — K6389 Other specified diseases of intestine: Secondary | ICD-10-CM

## 2013-01-09 NOTE — Telephone Encounter (Signed)
New Problem:  Pt is calling in to check on the status of his PET appt. Pt states Dr. [name] will know what he is talking about. Pt would like a call back regarding his appt time.

## 2013-01-09 NOTE — Telephone Encounter (Signed)
New Message  Per after hours Carlos Dawson) pt needs a PET CT scheduled// Orders are in the system... Please assist.

## 2013-01-10 NOTE — Telephone Encounter (Signed)
Returned call to patient schedulers will call back to schedule Pet CT of abdomen.

## 2013-01-15 ENCOUNTER — Other Ambulatory Visit: Payer: Self-pay | Admitting: Cardiology

## 2013-01-21 ENCOUNTER — Ambulatory Visit (HOSPITAL_COMMUNITY): Payer: 59 | Attending: Cardiovascular Disease | Admitting: Radiology

## 2013-01-21 ENCOUNTER — Other Ambulatory Visit: Payer: Self-pay | Admitting: Pulmonary Disease

## 2013-01-21 ENCOUNTER — Telehealth: Payer: Self-pay | Admitting: Cardiology

## 2013-01-21 ENCOUNTER — Other Ambulatory Visit: Payer: Self-pay | Admitting: Cardiology

## 2013-01-21 ENCOUNTER — Encounter: Payer: Self-pay | Admitting: Cardiovascular Disease

## 2013-01-21 VITALS — BP 104/69 | HR 56 | Ht 67.0 in | Wt 278.0 lb

## 2013-01-21 DIAGNOSIS — R0989 Other specified symptoms and signs involving the circulatory and respiratory systems: Secondary | ICD-10-CM | POA: Insufficient documentation

## 2013-01-21 DIAGNOSIS — Z9861 Coronary angioplasty status: Secondary | ICD-10-CM | POA: Insufficient documentation

## 2013-01-21 DIAGNOSIS — I712 Thoracic aortic aneurysm, without rupture, unspecified: Secondary | ICD-10-CM | POA: Insufficient documentation

## 2013-01-21 DIAGNOSIS — I10 Essential (primary) hypertension: Secondary | ICD-10-CM | POA: Insufficient documentation

## 2013-01-21 DIAGNOSIS — I4891 Unspecified atrial fibrillation: Secondary | ICD-10-CM | POA: Insufficient documentation

## 2013-01-21 DIAGNOSIS — R42 Dizziness and giddiness: Secondary | ICD-10-CM | POA: Insufficient documentation

## 2013-01-21 DIAGNOSIS — R079 Chest pain, unspecified: Secondary | ICD-10-CM | POA: Insufficient documentation

## 2013-01-21 DIAGNOSIS — I509 Heart failure, unspecified: Secondary | ICD-10-CM | POA: Insufficient documentation

## 2013-01-21 DIAGNOSIS — I2510 Atherosclerotic heart disease of native coronary artery without angina pectoris: Secondary | ICD-10-CM

## 2013-01-21 DIAGNOSIS — Z8249 Family history of ischemic heart disease and other diseases of the circulatory system: Secondary | ICD-10-CM | POA: Insufficient documentation

## 2013-01-21 DIAGNOSIS — Z87891 Personal history of nicotine dependence: Secondary | ICD-10-CM | POA: Insufficient documentation

## 2013-01-21 DIAGNOSIS — R0609 Other forms of dyspnea: Secondary | ICD-10-CM | POA: Insufficient documentation

## 2013-01-21 MED ORDER — REGADENOSON 0.4 MG/5ML IV SOLN
0.4000 mg | Freq: Once | INTRAVENOUS | Status: AC
  Administered 2013-01-21: 0.4 mg via INTRAVENOUS

## 2013-01-21 MED ORDER — TECHNETIUM TC 99M SESTAMIBI GENERIC - CARDIOLITE
30.0000 | Freq: Once | INTRAVENOUS | Status: AC | PRN
  Administered 2013-01-21: 30 via INTRAVENOUS

## 2013-01-21 MED ORDER — TECHNETIUM TC 99M SESTAMIBI GENERIC - CARDIOLITE
10.0000 | Freq: Once | INTRAVENOUS | Status: AC | PRN
  Administered 2013-01-21: 10 via INTRAVENOUS

## 2013-01-21 NOTE — Telephone Encounter (Signed)
New message   Pt did not want to disclose any info will speak to nurse when she calls back

## 2013-01-21 NOTE — Telephone Encounter (Signed)
Returned call to patient he stated he is scheduled for Pet Scan 01/24/13 and he wanted to make sure precert was done with his new insurance.Checked with Charmaine in our insurance dept.and she stated no precert required for Medicare.

## 2013-01-21 NOTE — Progress Notes (Signed)
[facility] SITE 3 NUCLEAR MED [address] [place], [place] [address] [phone]    Cardiology Nuclear Med Study  Carlos Dawson is a 72 y.o. male     MRN : [id number]     DOB: July 25, 1940  Procedure Date: 01/21/2013  Nuclear Med Background Indication for Stress Test:  Evaluation for Ischemia and Stent Patency History:  CAD; Stent-OM2; Cath 2012-patent stent; Afib; Echo 2012-EF 55%, severe LVH; MPI 2012-?small inferior infarct, EF 73%; CHF Cardiac Risk Factors: Family History - CAD, History of Smoking, Hypertension, Lipids and PVD (5.0cm thoracic aortic aneurysm)  Symptoms:  Chest Pain with Exertion, DOE and Light-Headedness   Nuclear Pre-Procedure Caffeine/Decaff Intake:  2:00pm NPO After: 7:00pm   Lungs:  clear O2 Sat: 97% on room air. IV 0.9% NS with Angio Cath:  20g  IV Site: R Hand  IV Started by:  Doyne Keel, CNMT  Chest Size (in):  52 Cup Size: n/a  Height: 5\' 7"  (1.702 m)  Weight:  278 lb (126.1 kg)  BMI:  Body mass index is 43.53 kg/(m^2). Tech Comments:  Took all am meds    Nuclear Med Study 1 or 2 day study: 1 day  Stress Test Type:  Lexiscan  Reading MD: Charlton Haws, MD  Order Authorizing Provider:  P. [name], MD  Resting Radionuclide: Technetium 69m Sestamibi  Resting Radionuclide Dose: 11.0 mCi   Stress Radionuclide:  Technetium 55m Sestamibi  Stress Radionuclide Dose: 33.0 mCi           Stress Protocol Rest HR: 56 Stress HR: 74  Rest BP: 104/69 Stress BP: 98/72  Exercise Time (min): n/a METS: n/a           Dose of Adenosine (mg):  n/a Dose of Lexiscan: 0.4 mg  Dose of Atropine (mg): n/a Dose of Dobutamine: n/a mcg/kg/min (at max HR)  Stress Test Technologist: Nelson Chimes, BS-ES  Nuclear Technologist:  Domenic Polite, CNMT     Rest Procedure:  Myocardial perfusion imaging was performed at rest 45 minutes following the intravenous administration of Technetium 87m Sestamibi. Rest ECG: No acute changes and NSR PAC;s   Stress  Procedure:  The patient received IV Lexiscan 0.4 mg over 15-seconds.  Technetium 52m Sestamibi injected at 30-seconds.  Quantitative spect images were obtained after a 45 minute delay.  During the infusion of Lexiscan, the patient became very SOB and had some chest tightness.  This all resolved in recovery.  Stress ECG: No significant change from baseline ECG  QPS Raw Data Images:  Bowel artifact and motion Stress Images:  There is decreased uptake in the inferior wall. Rest Images:  There is decreased uptake in the inferior wall. Subtraction (SDS):  Consistant with infarct/artifact Transient Ischemic Dilatation (Normal <1.22):  1.17 Lung/Heart Ratio (Normal <0.45):  0.46  Quantitative Gated Spect Images QGS EDV:  n/a QGS ESV:  n/a  Impression Exercise Capacity:  Lexiscan with no exercise. BP Response:  Normal blood pressure response. Clinical Symptoms:  Atypical chest pain. ECG Impression:  No significant ST segment change suggestive of ischemia. Comparison with Prior Nuclear Study: No images to compare  Overall Impression:  Low risk stress nuclear study Probable small inferior wall infarct. Bowel and motion make images difficult to interpret No ischemia  Images not gated due to PAC;s .  LV Ejection Fraction: Study not gated.  LV Wall Motion:  Study not gated  [employer]

## 2013-01-22 ENCOUNTER — Encounter: Payer: Self-pay | Admitting: Physician Assistant

## 2013-01-24 ENCOUNTER — Ambulatory Visit (HOSPITAL_COMMUNITY)
Admission: RE | Admit: 2013-01-24 | Discharge: 2013-01-24 | Disposition: A | Discharge status: Home / Self Care | Payer: Medicare Other | Source: Ambulatory Visit | Attending: Cardiology | Admitting: Cardiology

## 2013-01-24 DIAGNOSIS — K6389 Other specified diseases of intestine: Secondary | ICD-10-CM

## 2013-01-27 ENCOUNTER — Encounter: Payer: Self-pay | Admitting: Cardiology

## 2013-01-27 ENCOUNTER — Ambulatory Visit (INDEPENDENT_AMBULATORY_CARE_PROVIDER_SITE_OTHER): Payer: Medicare Other | Admitting: Cardiology

## 2013-01-27 VITALS — BP 123/65 | HR 60 | Ht 67.0 in | Wt 281.0 lb

## 2013-01-27 DIAGNOSIS — I10 Essential (primary) hypertension: Secondary | ICD-10-CM

## 2013-01-27 DIAGNOSIS — I712 Thoracic aortic aneurysm, without rupture, unspecified: Secondary | ICD-10-CM

## 2013-01-27 DIAGNOSIS — I2510 Atherosclerotic heart disease of native coronary artery without angina pectoris: Secondary | ICD-10-CM

## 2013-01-27 DIAGNOSIS — Z7901 Long term (current) use of anticoagulants: Secondary | ICD-10-CM

## 2013-01-27 NOTE — Progress Notes (Signed)
Carlos MondayKenneth C Dawson Date of Birth: [birth year] Medical Record #[id number]  History of Present Illness: Carlos LinkKen is seen today for followup. He has multiple medical issues including CAD, thoracic aneurysm, pulmonary emboli, morbid obesity with prior lap banding, HTN, chronic edema and PAF. He he is on coumadin. He underwent stenting of the obtuse marginal vessel in September of 2012. His episode of pulmonary embolus was following an Achilles tendon repair when he was immobilized for a long time. He was seen in December with atypical chest pain. His pain is more in the right rib cage and back. He was admitted and cardiac enzymes were all normal. CT of the chest showed no change in thoracic aortic aneurysm at 5 cm. There was an 18 mm soft tissue mass noted in the mid mesentary and he is scheduled for a PET scan.   Current Outpatient Prescriptions on File Prior to Visit  Medication Sig Dispense Refill  . albuterol (PROVENTIL HFA;VENTOLIN HFA) 108 (90 BASE) MCG/ACT inhaler Inhale 2 puffs into the lungs every 6 (six) hours as needed for wheezing.      Marland Kitchen. atenolol (TENORMIN) 25 MG tablet Take 25 mg by mouth daily.      . cetirizine (ZYRTEC) 10 MG tablet Take 10 mg by mouth daily as needed for allergies.      . Cholecalciferol (VITAMIN D-3) 5000 UNITS TABS Take 5,000 Units by mouth daily.      Marland Kitchen. DIOVAN 160 MG tablet TAKE 1 TABLET BY MOUTH TWICE A DAY  180 tablet  1  . finasteride (PROSCAR) 5 MG tablet Take 5 mg by mouth daily.      . fluticasone (FLONASE) 50 MCG/ACT nasal spray Place 2 sprays into the nose daily as needed for rhinitis.      . furosemide (LASIX) 80 MG tablet Take 80 mg by mouth 2 (two) times daily.      Marland Kitchen. KLOR-CON M20 20 MEQ tablet TAKE 2 TABLETS BY MOUTH TWICE A DAY  120 tablet  5  . montelukast (SINGULAIR) 10 MG tablet Take 10 mg by mouth at bedtime.      . nitroGLYCERIN (NITROSTAT) 0.4 MG SL tablet Place 0.4 mg under the tongue every 5 (five) minutes as needed for chest pain.      .  pantoprazole (PROTONIX) 40 MG tablet TAKE 1 TABLET BY MOUTH EVERY DAY  30 tablet  6  . RANEXA 500 MG 12 hr tablet TAKE 1 TABLET BY MOUTH TWICE A DAY  60 tablet  5  . rosuvastatin (CRESTOR) 10 MG tablet Take 10 mg by mouth daily.      . tamsulosin (FLOMAX) 0.4 MG CAPS Take 0.4 mg by mouth daily.      . traMADol (ULTRAM) 50 MG tablet Take 50 mg by mouth every 6 (six) hours as needed for pain.      Marland Kitchen. warfarin (COUMADIN) 5 MG tablet TAKE AS DIRECTED BY ANTICOAGULATION CLINIC  60 tablet  2   No current facility-administered medications on file prior to visit.    Allergies  Allergen Reactions  . Morphine Itching  . Adhesive [Tape] Itching and Rash    Where applied.    Past Medical History  Diagnosis Date  . Hypertension   . Obesities, morbid   . Pulmonary embolism     2008  . Anticoagulant long-term use   . CAD (coronary artery disease)     a. s/p BMS to 2nd OM in Sept 2012; b. LexiScan Myoview (12/2012):  Inf infarct; bowel and  motion artifact make study difficult to interpret; no ischemia; not gated; Low Risk  . Thoracic aortic aneurysm     followed by Dr. Servando Snare; last CT November 11, 2010 and unchanged  . OA (osteoarthritis)   . LVH (left ventricular hypertrophy)   . Colonic polyp   . Mild intermittent asthma   . Hemorrhoids   . Generalized headaches   . SOB (shortness of breath)     using oxygen with exercise  . Diastolic dysfunction   . ASCVD (arteriosclerotic cardiovascular disease)     Prior BMS to the 2nd OM in September 2012; with repeat cath in October showing patency  . GERD (gastroesophageal reflux disease)   . IBS (irritable bowel syndrome)   . Aortic root enlargement   . Ascending aortic aneurysm     recent scan in October 2012 showing no change; followed by Dr. Servando Snare  . PAF (paroxysmal atrial fibrillation)     treated with Coumadin  . Hearing loss   . Contact lens/glasses fitting   . OSA (obstructive sleep apnea)     PSG 03/30/97 AHI 21, BPAP 13/9  .  Sleep apnea     Past Surgical History  Procedure Laterality Date  . Hemorroidectomy    . Vasectomy    . Cervical spine surgery  06/02/2010    lower back and neck  . Gastric bypass  05/27/2007  . Cholecystectomy    . Rotator cuff repair      both  . Achilles tendon repair    . Cardiac catheterization  2006  . Coronary stent placement  Sept 2012    2nd OM with BMS  . Cardiac catheterization  October 2012    Stent patent  . Appendectomy    . Eye surgery      bilateral cataract  . Laminectomy  05/30/2012    L 4 L5    History  Smoking status  . Former Smoker -- 1.50 packs/day for 30 years  . Quit date: 01/24/1992  Smokeless tobacco  . Never Used    Comment: Stopped in 1983    History  Alcohol Use No    Family History  Problem Relation Age of Onset  . Heart disease Mother   . Diabetes Mother   . Other Mother     stent placement  . Emphysema Father 2  . Heart attack Sister     Review of Systems: The review of systems is per the HPI. All other systems were reviewed and are negative.  Physical Exam: BP 123/65  Pulse 60  Ht 5\' 7"  (1.702 m)  Wt 281 lb (127.461 kg)  BMI 44.00 kg/m2 Patient is a pleasant obese white male no acute distress. He is morbidly obese. Skin is warm and dry. Color is normal.  HEENT is unremarkable. Normocephalic/atraumatic. PERRL. Sclera are nonicteric. Neck is supple. No masses. No JVD. Lungs are clear. Cardiac exam shows a regular rate and rhythm. No gallop or murmur. Abdomen is obese but soft. Bowel sounds are positive. Extremities are full but without significant edema. Gait and ROM are intact. No gross neurologic deficits noted.   LABORATORY DATA:  Lab Results  Component Value Date   WBC 6.7 01/06/2013   HGB 13.9 01/06/2013   HCT 41.8 01/06/2013   PLT 172 01/06/2013   GLUCOSE 99 01/07/2013   CHOL 118 01/07/2013   TRIG 133 01/07/2013   HDL 40 01/07/2013   LDLCALC 51 01/07/2013   ALT 13 01/06/2013   AST 18 01/06/2013  NA 140  01/07/2013   K 3.8 01/07/2013   CL 104 01/07/2013   CREATININE 1.22 01/07/2013   BUN 19 01/07/2013   CO2 28 01/07/2013   TSH 1.686 01/06/2013   INR 2.04* 01/06/2013   HGBA1C  Value: 6.7 (NOTE)   The ADA recommends the following therapeutic goals for glycemic   control related to Hgb A1C measurement:   Goal of Therapy:   < 7.0% Hgb A1C   Action Suggested:  > 8.0% Hgb A1C   Ref:  Diabetes Care, 22, Suppl. 1, 1999* 08/19/2006   Lab Results  Component Value Date   CHOL 118 01/07/2013     Assessment / Plan:  1. CAD - prior bare-metal stent of the OM in 2012. No recurrent anginal pain.   2. Diastolic dysfunction -  Looks well compensated. Doing well on Lasix 80 mg twice a day. Continue sodium restriction.  3. HTN -  Blood pressure is well controlled.  4. Morbid obesity  5. Thoracic aneurysm -followed by Dr. Servando Snare annually. Last CT scan in Dec 2014 showed stable appearance.   6. PAF - now on Xarelto.  7. HLD  8. Right rib/back pain. I suspect this is more musculoskeletal. ? Radicular. Will follow up with Dr. Ronnald Ramp.  9. Mesenteric soft tissue mass- PET scan pending.

## 2013-01-27 NOTE — Patient Instructions (Signed)
Continue your current therapy  We will await the results of your PET scan.  I will see you  In 6 months.

## 2013-01-28 ENCOUNTER — Encounter (HOSPITAL_COMMUNITY)
Admission: RE | Admit: 2013-01-28 | Discharge: 2013-01-28 | Disposition: A | Discharge status: Home / Self Care | Payer: Medicare Other | Source: Ambulatory Visit | Attending: Cardiology | Admitting: Cardiology

## 2013-01-28 DIAGNOSIS — I700 Atherosclerosis of aorta: Secondary | ICD-10-CM | POA: Insufficient documentation

## 2013-01-28 DIAGNOSIS — J398 Other specified diseases of upper respiratory tract: Secondary | ICD-10-CM | POA: Insufficient documentation

## 2013-01-28 DIAGNOSIS — R1904 Left lower quadrant abdominal swelling, mass and lump: Secondary | ICD-10-CM | POA: Insufficient documentation

## 2013-01-28 DIAGNOSIS — K668 Other specified disorders of peritoneum: Secondary | ICD-10-CM | POA: Diagnosis not present

## 2013-01-28 DIAGNOSIS — I712 Thoracic aortic aneurysm, without rupture, unspecified: Secondary | ICD-10-CM | POA: Insufficient documentation

## 2013-01-28 DIAGNOSIS — J988 Other specified respiratory disorders: Secondary | ICD-10-CM

## 2013-01-28 DIAGNOSIS — I517 Cardiomegaly: Secondary | ICD-10-CM | POA: Insufficient documentation

## 2013-01-28 DIAGNOSIS — D490 Neoplasm of unspecified behavior of digestive system: Secondary | ICD-10-CM | POA: Insufficient documentation

## 2013-01-28 DIAGNOSIS — I2510 Atherosclerotic heart disease of native coronary artery without angina pectoris: Secondary | ICD-10-CM | POA: Insufficient documentation

## 2013-01-28 DIAGNOSIS — L989 Disorder of the skin and subcutaneous tissue, unspecified: Secondary | ICD-10-CM | POA: Insufficient documentation

## 2013-01-28 LAB — GLUCOSE, CAPILLARY: Glucose-Capillary: 95 mg/dL (ref 70–99)

## 2013-01-29 ENCOUNTER — Encounter (HOSPITAL_COMMUNITY): Payer: Self-pay

## 2013-01-29 DIAGNOSIS — I712 Thoracic aortic aneurysm, without rupture, unspecified: Secondary | ICD-10-CM | POA: Diagnosis not present

## 2013-01-29 MED ORDER — FLUDEOXYGLUCOSE F - 18 (FDG) INJECTION
17.2000 | Freq: Once | INTRAVENOUS | Status: AC | PRN
  Administered 2013-01-29: 17.2 via INTRAVENOUS

## 2013-01-31 DIAGNOSIS — J069 Acute upper respiratory infection, unspecified: Secondary | ICD-10-CM | POA: Diagnosis not present

## 2013-02-03 ENCOUNTER — Ambulatory Visit (INDEPENDENT_AMBULATORY_CARE_PROVIDER_SITE_OTHER): Payer: Medicare Other | Admitting: Pharmacist

## 2013-02-03 DIAGNOSIS — Z5181 Encounter for therapeutic drug level monitoring: Secondary | ICD-10-CM | POA: Diagnosis not present

## 2013-02-03 DIAGNOSIS — Z7901 Long term (current) use of anticoagulants: Secondary | ICD-10-CM | POA: Diagnosis not present

## 2013-02-03 DIAGNOSIS — I2699 Other pulmonary embolism without acute cor pulmonale: Secondary | ICD-10-CM | POA: Diagnosis not present

## 2013-02-03 LAB — POCT INR: INR: 2.6

## 2013-02-05 DIAGNOSIS — M76899 Other specified enthesopathies of unspecified lower limb, excluding foot: Secondary | ICD-10-CM | POA: Diagnosis not present

## 2013-02-17 ENCOUNTER — Other Ambulatory Visit: Payer: Self-pay | Admitting: Cardiology

## 2013-02-26 ENCOUNTER — Encounter (INDEPENDENT_AMBULATORY_CARE_PROVIDER_SITE_OTHER): Payer: Self-pay | Admitting: Surgery

## 2013-02-26 ENCOUNTER — Ambulatory Visit (INDEPENDENT_AMBULATORY_CARE_PROVIDER_SITE_OTHER): Payer: Medicare Other | Admitting: Surgery

## 2013-02-26 VITALS — BP 110/74 | HR 72 | Resp 14 | Ht 67.0 in | Wt 276.4 lb

## 2013-02-26 DIAGNOSIS — Z9884 Bariatric surgery status: Secondary | ICD-10-CM

## 2013-02-26 NOTE — Patient Instructions (Signed)
Thanks for your patience.  If you need further assistance after leaving the office, please call our office and speak with a [facility] nurse.  [phone].  If you want to leave a message for Dr. Ortha Metts, please call his office phone at [phone]. 

## 2013-02-26 NOTE — Progress Notes (Signed)
Lapband Fill Encounter Problem List:   Patient Active Problem List   Diagnosis Date Noted  . Chest pain at rest 01/06/2013  . Chronic diastolic heart failure 76/22/6333  . HLD (hyperlipidemia) 12/26/2012  . Allergic rhinitis 12/24/2012  . Long term (current) use of anticoagulants 08/29/2012  . Lapband APL May 2009 10/25/2011  . Hypoxemia 12/08/2010  . Dyspnea 12/08/2010  . Hypertension   . Obesities, morbid   . Pulmonary embolism   . Anticoagulant long-term use   . CAD (coronary artery disease)   . Thoracic aortic aneurysm   . OA (osteoarthritis)   . LVH (left ventricular hypertrophy)   . Paroxysmal atrial fibrillation   . Mild persistent asthma, well controlled 10/21/2008  . OBSTRUCTIVE SLEEP APNEA 08/06/2008    [name] Body mass index is 43.28 kg/(m^2). Weight loss since surgery  13.8  Having regurgitation?:  no  Feel that they need a fill?  no  Nocturnal reflux?  no  Amount of fill  0     Instructions given and weight loss goals discussed.    Mr. Rinck has not been seen in the year. When he checked out before he was asked to be seen in 6 months her office never got back with him. He was up here with his wife seeing Dr. Lucia Gaskins about her breast cancer undermined R. Staff that he needed to see me and so am seeing him and he year.  He is in a situation where I can't find him anymore or he will get reflux and obstruction. We talked about the things he should he and the things he should avoid. Opened anesthesia in 6 months and hopefully will see him on about July or August. I made him a no charge today because 1 he waited late to see me and I was late and also because we did not make this appointment in a timely fashion.  Matt B. Hassell Done, MD, FACS

## 2013-03-03 ENCOUNTER — Ambulatory Visit (INDEPENDENT_AMBULATORY_CARE_PROVIDER_SITE_OTHER): Payer: Medicare Other | Admitting: Pharmacist

## 2013-03-03 DIAGNOSIS — I2699 Other pulmonary embolism without acute cor pulmonale: Secondary | ICD-10-CM

## 2013-03-03 DIAGNOSIS — Z7901 Long term (current) use of anticoagulants: Secondary | ICD-10-CM | POA: Diagnosis not present

## 2013-03-03 DIAGNOSIS — Z5181 Encounter for therapeutic drug level monitoring: Secondary | ICD-10-CM | POA: Diagnosis not present

## 2013-03-03 LAB — POCT INR: INR: 2.9

## 2013-03-06 DIAGNOSIS — M25559 Pain in unspecified hip: Secondary | ICD-10-CM | POA: Diagnosis not present

## 2013-03-13 ENCOUNTER — Telehealth: Payer: Self-pay | Admitting: Pharmacist

## 2013-03-13 NOTE — Telephone Encounter (Signed)
Message copied by [name] on Thu Mar 13, 2013  4:15 PM ------      Message from: Dawson, Carlos M      Created: Mon Mar 03, 2013  1:28 PM       Yes thank you.            Carlos Martinique MD, [facility]                  ----- Message -----         From: [name], [facility]         Sent: 03/03/2013   1:10 PM           To: Carlos M Martinique, MD            Pt would like to switch from Coumadin back to Xarelto.  He stopped Xarelto due to ? Increase in muscle pains but this has not changed and he thinks more related to arthritis than medication.  Are you okay with this change?       ------

## 2013-03-13 NOTE — Telephone Encounter (Signed)
Spoke with pt.  He is aware okay to change back to Xarelto.  He already has a prescription at home.

## 2013-03-18 DIAGNOSIS — M4804 Spinal stenosis, thoracic region: Secondary | ICD-10-CM | POA: Diagnosis not present

## 2013-03-19 ENCOUNTER — Emergency Department (HOSPITAL_COMMUNITY): Payer: No Typology Code available for payment source

## 2013-03-19 ENCOUNTER — Emergency Department (HOSPITAL_COMMUNITY)
Admission: EM | Admit: 2013-03-19 | Discharge: 2013-03-19 | Disposition: A | Discharge status: Home / Self Care | Payer: No Typology Code available for payment source | Attending: Emergency Medicine | Admitting: Emergency Medicine

## 2013-03-19 ENCOUNTER — Encounter (HOSPITAL_COMMUNITY): Payer: Self-pay | Admitting: Emergency Medicine

## 2013-03-19 DIAGNOSIS — Z860100 Personal history of colon polyps, unspecified: Secondary | ICD-10-CM | POA: Insufficient documentation

## 2013-03-19 DIAGNOSIS — E785 Hyperlipidemia, unspecified: Secondary | ICD-10-CM | POA: Diagnosis not present

## 2013-03-19 DIAGNOSIS — S0180XA Unspecified open wound of other part of head, initial encounter: Secondary | ICD-10-CM | POA: Diagnosis not present

## 2013-03-19 DIAGNOSIS — Z9981 Dependence on supplemental oxygen: Secondary | ICD-10-CM | POA: Diagnosis not present

## 2013-03-19 DIAGNOSIS — Z86711 Personal history of pulmonary embolism: Secondary | ICD-10-CM | POA: Diagnosis not present

## 2013-03-19 DIAGNOSIS — M545 Low back pain, unspecified: Secondary | ICD-10-CM | POA: Diagnosis not present

## 2013-03-19 DIAGNOSIS — Z9884 Bariatric surgery status: Secondary | ICD-10-CM | POA: Insufficient documentation

## 2013-03-19 DIAGNOSIS — I10 Essential (primary) hypertension: Secondary | ICD-10-CM | POA: Diagnosis not present

## 2013-03-19 DIAGNOSIS — S139XXA Sprain of joints and ligaments of unspecified parts of neck, initial encounter: Secondary | ICD-10-CM | POA: Diagnosis not present

## 2013-03-19 DIAGNOSIS — E119 Type 2 diabetes mellitus without complications: Secondary | ICD-10-CM | POA: Diagnosis not present

## 2013-03-19 DIAGNOSIS — I4891 Unspecified atrial fibrillation: Secondary | ICD-10-CM | POA: Diagnosis not present

## 2013-03-19 DIAGNOSIS — Z79899 Other long term (current) drug therapy: Secondary | ICD-10-CM | POA: Insufficient documentation

## 2013-03-19 DIAGNOSIS — T1490XA Injury, unspecified, initial encounter: Secondary | ICD-10-CM | POA: Diagnosis not present

## 2013-03-19 DIAGNOSIS — Z87891 Personal history of nicotine dependence: Secondary | ICD-10-CM | POA: Insufficient documentation

## 2013-03-19 DIAGNOSIS — I2510 Atherosclerotic heart disease of native coronary artery without angina pectoris: Secondary | ICD-10-CM | POA: Diagnosis not present

## 2013-03-19 DIAGNOSIS — S0181XA Laceration without foreign body of other part of head, initial encounter: Secondary | ICD-10-CM

## 2013-03-19 DIAGNOSIS — Z789 Other specified health status: Secondary | ICD-10-CM | POA: Diagnosis not present

## 2013-03-19 DIAGNOSIS — IMO0002 Reserved for concepts with insufficient information to code with codable children: Secondary | ICD-10-CM | POA: Insufficient documentation

## 2013-03-19 DIAGNOSIS — S0990XA Unspecified injury of head, initial encounter: Secondary | ICD-10-CM | POA: Insufficient documentation

## 2013-03-19 DIAGNOSIS — S199XXA Unspecified injury of neck, initial encounter: Secondary | ICD-10-CM | POA: Diagnosis present

## 2013-03-19 DIAGNOSIS — M1990 Unspecified osteoarthritis, unspecified site: Secondary | ICD-10-CM | POA: Diagnosis not present

## 2013-03-19 DIAGNOSIS — H9190 Unspecified hearing loss, unspecified ear: Secondary | ICD-10-CM | POA: Insufficient documentation

## 2013-03-19 DIAGNOSIS — Y92410 Unspecified street and highway as the place of occurrence of the external cause: Secondary | ICD-10-CM | POA: Diagnosis not present

## 2013-03-19 DIAGNOSIS — Y9389 Activity, other specified: Secondary | ICD-10-CM | POA: Insufficient documentation

## 2013-03-19 DIAGNOSIS — Z7901 Long term (current) use of anticoagulants: Secondary | ICD-10-CM | POA: Diagnosis not present

## 2013-03-19 DIAGNOSIS — S0993XA Unspecified injury of face, initial encounter: Secondary | ICD-10-CM | POA: Diagnosis not present

## 2013-03-19 DIAGNOSIS — Z9889 Other specified postprocedural states: Secondary | ICD-10-CM | POA: Insufficient documentation

## 2013-03-19 DIAGNOSIS — K219 Gastro-esophageal reflux disease without esophagitis: Secondary | ICD-10-CM | POA: Insufficient documentation

## 2013-03-19 DIAGNOSIS — G4733 Obstructive sleep apnea (adult) (pediatric): Secondary | ICD-10-CM | POA: Insufficient documentation

## 2013-03-19 DIAGNOSIS — J45909 Unspecified asthma, uncomplicated: Secondary | ICD-10-CM | POA: Insufficient documentation

## 2013-03-19 DIAGNOSIS — I719 Aortic aneurysm of unspecified site, without rupture: Secondary | ICD-10-CM | POA: Diagnosis not present

## 2013-03-19 DIAGNOSIS — S0120XA Unspecified open wound of nose, initial encounter: Secondary | ICD-10-CM | POA: Diagnosis not present

## 2013-03-19 DIAGNOSIS — M542 Cervicalgia: Secondary | ICD-10-CM | POA: Diagnosis not present

## 2013-03-19 DIAGNOSIS — S161XXA Strain of muscle, fascia and tendon at neck level, initial encounter: Secondary | ICD-10-CM

## 2013-03-19 DIAGNOSIS — I509 Heart failure, unspecified: Secondary | ICD-10-CM | POA: Diagnosis not present

## 2013-03-19 DIAGNOSIS — Z9861 Coronary angioplasty status: Secondary | ICD-10-CM | POA: Insufficient documentation

## 2013-03-19 DIAGNOSIS — E559 Vitamin D deficiency, unspecified: Secondary | ICD-10-CM | POA: Diagnosis not present

## 2013-03-19 MED ORDER — DIAZEPAM 5 MG PO TABS: 5.0000 mg | ORAL_TABLET | Freq: Four times a day (QID) | ORAL | Status: DC | PRN

## 2013-03-19 MED ORDER — OXYCODONE-ACETAMINOPHEN 5-325 MG PO TABS
1.0000 | ORAL_TABLET | Freq: Once | ORAL | Status: AC
  Administered 2013-03-19: 1 via ORAL
  Filled 2013-03-19: qty 1

## 2013-03-19 MED ORDER — OXYCODONE-ACETAMINOPHEN 5-325 MG PO TABS: 1.0000 | ORAL_TABLET | Freq: Four times a day (QID) | ORAL | Status: DC | PRN
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
## 2013-03-19 NOTE — Discharge Instructions (Signed)
Cervical Sprain A cervical sprain is an injury in the neck in which the strong, fibrous tissues (ligaments) that connect your neck bones stretch or tear. Cervical sprains can range from mild to severe. Severe cervical sprains can cause the neck vertebrae to be unstable. This can lead to damage of the spinal cord and can result in serious nervous system problems. The amount of time it takes for a cervical sprain to get better depends on the cause and extent of the injury. Most cervical sprains heal in 1 to 3 weeks. CAUSES  Severe cervical sprains may be caused by:   Contact sport injuries (such as from football, rugby, wrestling, hockey, auto racing, gymnastics, diving, martial arts, or boxing).   Motor vehicle collisions.   Whiplash injuries. This is an injury from a sudden forward-and backward whipping movement of the head and neck.  Falls.  Mild cervical sprains may be caused by:   Being in an awkward position, such as while cradling a telephone between your ear and shoulder.   Sitting in a chair that does not offer proper support.   Working at a poorly designed computer station.   Looking up or down for long periods of time.  SYMPTOMS   Pain, soreness, stiffness, or a burning sensation in the front, back, or sides of the neck. This discomfort may develop immediately after the injury or slowly, 24 hours or more after the injury.   Pain or tenderness directly in the middle of the back of the neck.   Shoulder or upper back pain.   Limited ability to move the neck.   Headache.   Dizziness.   Weakness, numbness, or tingling in the hands or arms.   Muscle spasms.   Difficulty swallowing or chewing.   Tenderness and swelling of the neck.  DIAGNOSIS  Most of the time your health care provider can diagnose a cervical sprain by taking your history and doing a physical exam. Your health care provider will ask about previous neck injuries and any known neck  problems, such as arthritis in the neck. X-rays may be taken to find out if there are any other problems, such as with the bones of the neck. Other tests, such as a CT scan or MRI, may also be needed.  TREATMENT  Treatment depends on the severity of the cervical sprain. Mild sprains can be treated with rest, keeping the neck in place (immobilization), and pain medicines. Severe cervical sprains are immediately immobilized. Further treatment is done to help with pain, muscle spasms, and other symptoms and may include:  Medicines, such as pain relievers, numbing medicines, or muscle relaxants.   Physical therapy. This may involve stretching exercises, strengthening exercises, and posture training. Exercises and improved posture can help stabilize the neck, strengthen muscles, and help stop symptoms from returning.  HOME CARE INSTRUCTIONS   Put ice on the injured area.   Put ice in a plastic bag.   Place a towel between your skin and the bag.   Leave the ice on for 15 20 minutes, 3 4 times a day.   If your injury was severe, you may have been given a cervical collar to wear. A cervical collar is a two-piece collar designed to keep your neck from moving while it heals.  Do not remove the collar unless instructed by your health care provider.  If you have long hair, keep it outside of the collar.  Ask your health care provider before making any adjustments to your collar.   Minor adjustments may be required over time to improve comfort and reduce pressure on your chin or on the back of your head.  Ifyou are allowed to remove the collar for cleaning or bathing, follow your health care provider's instructions on how to do so safely.  Keep your collar clean by wiping it with mild soap and water and drying it completely. If the collar you have been given includes removable pads, remove them every 1 2 days and hand wash them with soap and water. Allow them to air dry. They should be completely  dry before you wear them in the collar.  If you are allowed to remove the collar for cleaning and bathing, wash and dry the skin of your neck. Check your skin for irritation or sores. If you see any, tell your health care provider.  Do not drive while wearing the collar.   Only take over-the-counter or prescription medicines for pain, discomfort, or fever as directed by your health care provider.   Keep all follow-up appointments as directed by your health care provider.   Keep all physical therapy appointments as directed by your health care provider.   Make any needed adjustments to your workstation to promote good posture.   Avoid positions and activities that make your symptoms worse.   Warm up and stretch before being active to help prevent problems.  SEEK MEDICAL CARE IF:   Your pain is not controlled with medicine.   You are unable to decrease your pain medicine over time as planned.   Your activity level is not improving as expected.  SEEK IMMEDIATE MEDICAL CARE IF:   You develop any bleeding.  You develop stomach upset.  You have signs of an allergic reaction to your medicine.   Your symptoms get worse.   You develop new, unexplained symptoms.   You have numbness, tingling, weakness, or paralysis in any part of your body.  MAKE SURE YOU:   Understand these instructions.  Will watch your condition.  Will get help right away if you are not doing well or get worse. Document Released: 11/06/2006 Document Revised: 10/30/2012 Document Reviewed: 07/17/2012 [employer] Patient Information 2014 [employer].  

## 2013-03-21 ENCOUNTER — Telehealth: Payer: Self-pay | Admitting: Cardiology

## 2013-03-21 NOTE — Telephone Encounter (Signed)
New messsage   Fax request x 2  Over to the office. Need to stop xarleto x 1 day for myelogram.

## 2013-03-24 ENCOUNTER — Other Ambulatory Visit: Payer: Self-pay | Admitting: Neurological Surgery

## 2013-03-24 DIAGNOSIS — M4800 Spinal stenosis, site unspecified: Secondary | ICD-10-CM

## 2013-03-24 NOTE — Telephone Encounter (Signed)
Returned call to [place] at [facility] no answer.Left message on voice mail Dr.Jordan advised ok to hold Xarelto 48 hours prior to myelogram.Message also faxed to [place] at fax # [phone].

## 2013-03-24 NOTE — Telephone Encounter (Signed)
Will route to Dr. Martinique and Malachy Mood Pugh,LPN

## 2013-03-24 NOTE — Telephone Encounter (Signed)
OK to hold Xarelto for myelogram. Would hold for 48 hours.

## 2013-03-26 ENCOUNTER — Ambulatory Visit
Admission: RE | Admit: 2013-03-26 | Discharge: 2013-03-26 | Disposition: A | Discharge status: Home / Self Care | Payer: Medicare Other | Source: Ambulatory Visit | Attending: Neurological Surgery | Admitting: Neurological Surgery

## 2013-03-26 VITALS — BP 158/76 | HR 84 | Ht 67.0 in | Wt 275.0 lb

## 2013-03-26 DIAGNOSIS — M4800 Spinal stenosis, site unspecified: Secondary | ICD-10-CM

## 2013-03-26 MED ORDER — DIAZEPAM 5 MG PO TABS
5.0000 mg | ORAL_TABLET | Freq: Once | ORAL | Status: AC
  Administered 2013-03-26: 5 mg via ORAL

## 2013-03-26 NOTE — Discharge Instructions (Signed)
Myelogram Discharge Instructions  1. Go home and rest quietly for the next 24 hours.  It is important to lie flat for the next 24 hours.  Get up only to go to the restroom.  You may lie in the bed or on a couch on your back, your stomach, your left side or your right side.  You may have one pillow under your head.  You may have pillows between your knees while you are on your side or under your knees while you are on your back.  2. DO NOT drive today.  Recline the seat as far back as it will go, while still wearing your seat belt, on the way home.  3. You may get up to go to the bathroom as needed.  You may sit up for 10 minutes to eat.  You may resume your normal diet and medications unless otherwise indicated.  Drink lots of extra fluids today and tomorrow.  4. The incidence of headache, nausea, or vomiting is about 5% (one in 20 patients).  If you develop a headache, lie flat and drink plenty of fluids until the headache goes away.  Caffeinated beverages may be helpful.  If you develop severe nausea and vomiting or a headache that does not go away with flat bed rest, call [phone].  5. You may resume normal activities after your 24 hours of bed rest is over; however, do not exert yourself strongly or do any heavy lifting tomorrow. If when you get up you have a headache when standing, go back to bed and force fluids for another 24 hours.  6. Call your physician for a follow-up appointment.  The results of your myelogram will be sent directly to your physician by the following day.  7. If you have any questions or if complications develop after you arrive home, please call [phone].  Discharge instructions have been explained to the patient.  The patient, or the person responsible for the patient, fully understands these instructions.      May resume Xarelto today.  May resume tramadol on March 27, 2013, after 11:00 am.

## 2013-03-26 NOTE — Progress Notes (Signed)
Patient states he has been off Xarelto and Tramadol for at least the past two days.  Brita Romp, RN

## 2013-03-26 NOTE — Progress Notes (Signed)
Pt will come back tomorrow at 8:00 am. since CT is down.

## 2013-03-27 ENCOUNTER — Ambulatory Visit
Admission: RE | Admit: 2013-03-27 | Discharge: 2013-03-27 | Disposition: A | Discharge status: Home / Self Care | Payer: Medicare Other | Source: Ambulatory Visit | Attending: Neurological Surgery | Admitting: Neurological Surgery

## 2013-03-27 VITALS — BP 104/56 | HR 52

## 2013-03-27 DIAGNOSIS — M5020 Other cervical disc displacement, unspecified cervical region: Secondary | ICD-10-CM | POA: Diagnosis not present

## 2013-03-27 DIAGNOSIS — M542 Cervicalgia: Secondary | ICD-10-CM | POA: Diagnosis not present

## 2013-03-27 DIAGNOSIS — M549 Dorsalgia, unspecified: Secondary | ICD-10-CM

## 2013-03-27 DIAGNOSIS — M546 Pain in thoracic spine: Secondary | ICD-10-CM | POA: Diagnosis not present

## 2013-03-27 DIAGNOSIS — M4802 Spinal stenosis, cervical region: Secondary | ICD-10-CM | POA: Diagnosis not present

## 2013-03-27 DIAGNOSIS — M4804 Spinal stenosis, thoracic region: Secondary | ICD-10-CM | POA: Diagnosis not present

## 2013-03-27 DIAGNOSIS — M545 Low back pain, unspecified: Secondary | ICD-10-CM | POA: Diagnosis not present

## 2013-03-27 DIAGNOSIS — M5124 Other intervertebral disc displacement, thoracic region: Secondary | ICD-10-CM | POA: Diagnosis not present

## 2013-03-27 DIAGNOSIS — M48061 Spinal stenosis, lumbar region without neurogenic claudication: Secondary | ICD-10-CM | POA: Diagnosis not present

## 2013-03-27 MED ORDER — IOHEXOL 300 MG/ML  SOLN
10.0000 mL | Freq: Once | INTRAMUSCULAR | Status: AC | PRN
  Administered 2013-03-27: 10 mL via INTRATHECAL

## 2013-03-27 MED ORDER — DIAZEPAM 5 MG PO TABS
5.0000 mg | ORAL_TABLET | Freq: Once | ORAL | Status: AC
  Administered 2013-03-27: 5 mg via ORAL

## 2013-03-27 NOTE — Progress Notes (Signed)
Pt is back today to have myelo since CT is now up and running.

## 2013-04-07 DIAGNOSIS — R350 Frequency of micturition: Secondary | ICD-10-CM | POA: Diagnosis not present

## 2013-04-07 DIAGNOSIS — R42 Dizziness and giddiness: Secondary | ICD-10-CM | POA: Diagnosis not present

## 2013-04-08 DIAGNOSIS — Z6841 Body Mass Index (BMI) 40.0 and over, adult: Secondary | ICD-10-CM | POA: Diagnosis not present

## 2013-04-08 DIAGNOSIS — M549 Dorsalgia, unspecified: Secondary | ICD-10-CM | POA: Diagnosis not present

## 2013-04-10 DIAGNOSIS — H905 Unspecified sensorineural hearing loss: Secondary | ICD-10-CM | POA: Diagnosis not present

## 2013-04-10 DIAGNOSIS — H8110 Benign paroxysmal vertigo, unspecified ear: Secondary | ICD-10-CM | POA: Diagnosis not present

## 2013-04-10 DIAGNOSIS — I4891 Unspecified atrial fibrillation: Secondary | ICD-10-CM | POA: Diagnosis not present

## 2013-04-15 DIAGNOSIS — L259 Unspecified contact dermatitis, unspecified cause: Secondary | ICD-10-CM | POA: Diagnosis not present

## 2013-04-15 DIAGNOSIS — L538 Other specified erythematous conditions: Secondary | ICD-10-CM | POA: Diagnosis not present

## 2013-04-17 ENCOUNTER — Ambulatory Visit: Payer: Medicare Other | Attending: Otolaryngology | Admitting: Physical Therapy

## 2013-04-17 DIAGNOSIS — R42 Dizziness and giddiness: Secondary | ICD-10-CM | POA: Insufficient documentation

## 2013-04-17 DIAGNOSIS — IMO0001 Reserved for inherently not codable concepts without codable children: Secondary | ICD-10-CM | POA: Insufficient documentation

## 2013-04-17 DIAGNOSIS — R269 Unspecified abnormalities of gait and mobility: Secondary | ICD-10-CM | POA: Insufficient documentation

## 2013-04-18 ENCOUNTER — Ambulatory Visit: Payer: Medicare Other | Admitting: Rehabilitative and Restorative Service Providers"

## 2013-04-18 ENCOUNTER — Telehealth: Payer: Self-pay | Admitting: Cardiology

## 2013-04-18 NOTE — Telephone Encounter (Signed)
Returned call to patient he stated he needs crestor changed to atorvastatin.Stated he can get atorvastatin at the [place].Stated he will need written prescriptions for atorvastatin and ranexa.Dr.Jordan will be in office on Monday 04/21/13, will get him to sign prescriptions and mail them to patient.

## 2013-04-18 NOTE — Telephone Encounter (Signed)
New message     Patient wants to discuss changing medication he on - atorvastatin.    Patient get crestor from [place] . They do not offer that medication only atorvastatin.

## 2013-04-21 MED ORDER — ATORVASTATIN CALCIUM 20 MG PO TABS
20.0000 mg | ORAL_TABLET | Freq: Every day | ORAL | Status: DC
Start: 2013-04-21 — End: 2015-12-03

## 2013-04-21 MED ORDER — RANOLAZINE ER 500 MG PO TB12: 500.0000 mg | ORAL_TABLET | Freq: Two times a day (BID) | ORAL | Status: DC

## 2013-04-21 NOTE — Telephone Encounter (Signed)
Received call from patient he stated he would like me to mail prescriptions to his home address.Prescriptions will be mailed 04/22/13.

## 2013-04-21 NOTE — Telephone Encounter (Signed)
Returned call to patient no answer.Left message on personal voice mail spoke to [place] he advised ok to stop crestor and start atorvastatin 20 mg daily.90 day prescriptions for atorvastatin and ranexa will be left at 3rd floor front desk for pick up 04/22/13.

## 2013-05-02 ENCOUNTER — Ambulatory Visit: Payer: Medicare Other | Admitting: Physical Therapy

## 2013-05-20 DIAGNOSIS — Z6841 Body Mass Index (BMI) 40.0 and over, adult: Secondary | ICD-10-CM | POA: Diagnosis not present

## 2013-05-20 DIAGNOSIS — M549 Dorsalgia, unspecified: Secondary | ICD-10-CM | POA: Diagnosis not present

## 2013-05-26 DIAGNOSIS — N320 Bladder-neck obstruction: Secondary | ICD-10-CM | POA: Diagnosis not present

## 2013-05-26 DIAGNOSIS — N400 Enlarged prostate without lower urinary tract symptoms: Secondary | ICD-10-CM | POA: Diagnosis not present

## 2013-05-26 DIAGNOSIS — N529 Male erectile dysfunction, unspecified: Secondary | ICD-10-CM | POA: Diagnosis not present

## 2013-05-26 DIAGNOSIS — R972 Elevated prostate specific antigen [PSA]: Secondary | ICD-10-CM | POA: Diagnosis not present

## 2013-06-10 ENCOUNTER — Telehealth: Payer: Self-pay | Admitting: *Deleted

## 2013-06-10 NOTE — Telephone Encounter (Signed)
error 

## 2013-06-11 ENCOUNTER — Ambulatory Visit: Payer: Medicare Other | Admitting: Pulmonary Disease

## 2013-06-30 ENCOUNTER — Ambulatory Visit: Payer: Medicare Other | Attending: Otolaryngology | Admitting: Physical Therapy

## 2013-06-30 DIAGNOSIS — IMO0001 Reserved for inherently not codable concepts without codable children: Secondary | ICD-10-CM | POA: Diagnosis not present

## 2013-06-30 DIAGNOSIS — R42 Dizziness and giddiness: Secondary | ICD-10-CM | POA: Insufficient documentation

## 2013-06-30 DIAGNOSIS — R269 Unspecified abnormalities of gait and mobility: Secondary | ICD-10-CM | POA: Diagnosis not present

## 2013-07-02 ENCOUNTER — Other Ambulatory Visit: Payer: Self-pay | Admitting: Cardiology

## 2013-07-03 ENCOUNTER — Ambulatory Visit: Payer: Medicare Other | Admitting: Physical Therapy

## 2013-07-03 ENCOUNTER — Other Ambulatory Visit: Payer: Self-pay

## 2013-07-03 DIAGNOSIS — IMO0001 Reserved for inherently not codable concepts without codable children: Secondary | ICD-10-CM | POA: Diagnosis not present

## 2013-07-03 DIAGNOSIS — R269 Unspecified abnormalities of gait and mobility: Secondary | ICD-10-CM | POA: Diagnosis not present

## 2013-07-03 DIAGNOSIS — R42 Dizziness and giddiness: Secondary | ICD-10-CM | POA: Diagnosis not present

## 2013-07-08 ENCOUNTER — Ambulatory Visit: Payer: Medicare Other | Admitting: Physical Therapy

## 2013-07-08 DIAGNOSIS — IMO0001 Reserved for inherently not codable concepts without codable children: Secondary | ICD-10-CM | POA: Diagnosis not present

## 2013-07-08 DIAGNOSIS — R269 Unspecified abnormalities of gait and mobility: Secondary | ICD-10-CM | POA: Diagnosis not present

## 2013-07-08 DIAGNOSIS — R42 Dizziness and giddiness: Secondary | ICD-10-CM | POA: Diagnosis not present

## 2013-07-14 ENCOUNTER — Other Ambulatory Visit: Payer: Self-pay | Admitting: *Deleted

## 2013-07-14 MED ORDER — RIVAROXABAN 20 MG PO TABS: ORAL_TABLET | ORAL | Status: DC

## 2013-07-14 NOTE — Telephone Encounter (Signed)
Patient requests printed rx for xarelto to pick up at the office.

## 2013-08-08 ENCOUNTER — Other Ambulatory Visit: Payer: Self-pay | Admitting: Cardiology

## 2013-08-11 ENCOUNTER — Other Ambulatory Visit: Payer: Self-pay

## 2013-08-11 DIAGNOSIS — I712 Thoracic aortic aneurysm, without rupture, unspecified: Secondary | ICD-10-CM

## 2013-08-12 ENCOUNTER — Encounter: Payer: Self-pay | Admitting: Adult Health

## 2013-08-12 ENCOUNTER — Ambulatory Visit (INDEPENDENT_AMBULATORY_CARE_PROVIDER_SITE_OTHER)
Admission: RE | Admit: 2013-08-12 | Discharge: 2013-08-12 | Disposition: A | Discharge status: Home / Self Care | Payer: Medicare Other | Source: Ambulatory Visit | Attending: Adult Health | Admitting: Adult Health

## 2013-08-12 ENCOUNTER — Ambulatory Visit: Payer: Medicare Other | Admitting: Adult Health

## 2013-08-12 VITALS — BP 102/58 | HR 59 | Temp 97.8°F | Ht 67.0 in | Wt 283.4 lb

## 2013-08-12 DIAGNOSIS — R0600 Dyspnea, unspecified: Secondary | ICD-10-CM

## 2013-08-12 DIAGNOSIS — J189 Pneumonia, unspecified organism: Secondary | ICD-10-CM

## 2013-08-12 DIAGNOSIS — R0609 Other forms of dyspnea: Secondary | ICD-10-CM

## 2013-08-12 DIAGNOSIS — J4530 Mild persistent asthma, uncomplicated: Secondary | ICD-10-CM

## 2013-08-12 DIAGNOSIS — R0989 Other specified symptoms and signs involving the circulatory and respiratory systems: Secondary | ICD-10-CM

## 2013-08-12 MED ORDER — LEVALBUTEROL HCL 0.63 MG/3ML IN NEBU
0.6300 mg | INHALATION_SOLUTION | Freq: Once | RESPIRATORY_TRACT | Status: AC
  Administered 2013-08-12: 0.63 mg via RESPIRATORY_TRACT

## 2013-08-12 MED ORDER — PREDNISONE 10 MG PO TABS: ORAL_TABLET | ORAL | Status: DC

## 2013-08-12 MED ORDER — HYDROCODONE-HOMATROPINE 5-1.5 MG/5ML PO SYRP: ORAL_SOLUTION | ORAL | Status: DC

## 2013-08-12 MED ORDER — MOXIFLOXACIN HCL 400 MG PO TABS: 400.0000 mg | ORAL_TABLET | Freq: Every day | ORAL | Status: DC

## 2013-08-12 MED ORDER — AZITHROMYCIN 250 MG PO TABS: ORAL_TABLET | ORAL | Status: DC

## 2013-08-12 NOTE — Progress Notes (Signed)
Reviewed and agree with assessment/plan. 

## 2013-08-12 NOTE — Assessment & Plan Note (Signed)
CXR returned with RLL PNA   Plan  Do not take zpack  Begin Avelox x 7 d  1 week follow up with CXR w/ Dr. Halford Chessman   Please contact office for sooner follow up if symptoms do not improve or worsen or seek emergency care  -pt called and aware of CXR results. And need for 1 week follow up and abx changes

## 2013-08-12 NOTE — Assessment & Plan Note (Signed)
Exacerbation w/ URI   Plan  Z-Pak, take as directed. Prednisone taper for the next week. Mucinex DM twice daily as needed. For cough and congestion. Fluids and rest. Tylenol as needed. For fever. Hydromet 1-2 teaspoons every 6 hours as needed. For cough, may make you sleepy Chest xray today  Follow Dr. Halford Chessman in 2 weeks and as needed  Please contact office for sooner follow up if symptoms do not improve or worsen or seek emergency care

## 2013-08-12 NOTE — Patient Instructions (Addendum)
Z-Pak, take as directed. Prednisone taper for the next week. Mucinex DM twice daily as needed. For cough and congestion. Fluids and rest. Tylenol as needed. For fever. Hydromet 1-2 teaspoons every 6 hours as needed. For cough, may make you sleepy Chest xray today  Follow Dr. Halford Chessman in 2 weeks and as needed  Please contact office for sooner follow up if symptoms do not improve or worsen or seek emergency care    Late Add : CXR shows RLL PNA  Void zpack  BEgin Avelox 400mg  daily for 7 days  1 week with follow up with cxr with Dr. Halford Chessman   Please contact office for sooner follow up if symptoms do not improve or worsen or seek emergency care  Pt aware

## 2013-08-12 NOTE — Progress Notes (Signed)
   Subjective:    Patient ID: Carlos Dawson, male    DOB: 1940-05-10, 73 y.o.   MRN: [id number]  HPI 73 yo former smoker with dyspnea, OSA on BPAP 12/8, and asthma.  08/12/2013 Acute OV  Complains of 3 days of productive cough, with thick, white yellow mucus, fever, up to 100.3, wheezing, chest tightness, and general malaise with body aches. Patient denies any hemoptysis, orthopnea, PND, or leg swelling. No recent antibiotics. Patient has traveled to the beach in the last few weeks. He denies any calf pain, or increased leg swelling. Patient has taken Tylenol with some help. Unable to wear BiPAP.  Due to cough    Tests: PSG 03/30/97 >> AHI 21 ONO with BPAP and room air 12/12/10 >> Test time 6 hrs 56 min.  Basal SpO2 93.1%, low SpO2 86%.  Spent 2.8 min with SpO2 < 88%. PFT 12/20/10 >> FEV1 2.13(79%), FEV1% 71, TLC 5.80(100%), DLCO 57%, no BD CT chest 09/14/11 >> Stable dilatation of ascending thoracic aorta 4.7 cm, PA 4.1 cm BPAP 09/30/11 to 11/28/11>>Used on 29 of 60 nights with average 3 hrs 21 min. Average AHI 3 with BiPAP 13/9 cm H2O.    Review of Systems Constitutional:   No  weight loss, night sweats,   +Fevers, chills, fatigue, or  lassitude.  HEENT:   No headaches,  Difficulty swallowing,  Tooth/dental problems, or  Sore throat,                No sneezing, itching, ear ache,  ++nasal congestion, post nasal drip,   CV:  No chest pain,  Orthopnea, PND, swelling in lower extremities, anasarca, dizziness, palpitations, syncope.   GI  No heartburn, indigestion, abdominal pain, nausea, vomiting, diarrhea, change in bowel habits, loss of appetite, bloody stools.   Resp:   No chest wall deformity  Skin: no rash or lesions.  GU: no dysuria, change in color of urine, no urgency or frequency.  No flank pain, no hematuria   MS:  No joint pain or swelling.  No decreased range of motion.  No back pain.  Psych:  No change in mood or affect. No depression or anxiety.  No memory  loss.          Objective:   Physical Exam GEN: A/Ox3; pleasant , NAD, elderly , w/ barking cough  HEENT:  [place]/AT,  EACs-clear, TMs-wnl, NOSE-clear, THROAT-clear, no lesions, no postnasal drip or exudate noted.   NECK:  Supple w/ fair ROM; no JVD; normal carotid impulses w/o bruits; no thyromegaly or nodules palpated; no lymphadenopathy.  RESP  Exp wheezing  no accessory muscle use, no dullness to percussion  CARD:  RRR, no m/r/g  , no peripheral edema, pulses intact, no cyanosis or clubbing.  GI:   Soft & nt; nml bowel sounds; no organomegaly or masses detected.  Musco: Warm bil, no deformities or joint swelling noted.   Neuro: alert, no focal deficits noted.    Skin: Warm, no lesions or rashes         Assessment & Plan:

## 2013-08-13 ENCOUNTER — Ambulatory Visit: Payer: Medicare Other | Admitting: Pulmonary Disease

## 2013-08-18 ENCOUNTER — Other Ambulatory Visit: Payer: Self-pay | Admitting: Adult Health

## 2013-08-21 ENCOUNTER — Ambulatory Visit (INDEPENDENT_AMBULATORY_CARE_PROVIDER_SITE_OTHER)
Admission: RE | Admit: 2013-08-21 | Discharge: 2013-08-21 | Disposition: A | Discharge status: Home / Self Care | Payer: Medicare Other | Source: Ambulatory Visit | Attending: Adult Health | Admitting: Adult Health

## 2013-08-21 ENCOUNTER — Encounter: Payer: Self-pay | Admitting: Adult Health

## 2013-08-21 ENCOUNTER — Ambulatory Visit: Payer: Medicare Other | Admitting: Adult Health

## 2013-08-21 VITALS — BP 94/64 | HR 61 | Temp 98.0°F | Ht 67.0 in | Wt 278.0 lb

## 2013-08-21 DIAGNOSIS — G4733 Obstructive sleep apnea (adult) (pediatric): Secondary | ICD-10-CM

## 2013-08-21 DIAGNOSIS — J189 Pneumonia, unspecified organism: Secondary | ICD-10-CM

## 2013-08-21 DIAGNOSIS — J4530 Mild persistent asthma, uncomplicated: Secondary | ICD-10-CM

## 2013-08-21 DIAGNOSIS — J45909 Unspecified asthma, uncomplicated: Secondary | ICD-10-CM | POA: Diagnosis not present

## 2013-08-21 MED ORDER — LEVALBUTEROL HCL 0.63 MG/3ML IN NEBU
0.6300 mg | INHALATION_SOLUTION | Freq: Once | RESPIRATORY_TRACT | Status: AC
  Administered 2013-08-21: 0.63 mg via RESPIRATORY_TRACT

## 2013-08-21 NOTE — Progress Notes (Signed)
Reviewed and agree with assessment/plan. 

## 2013-08-21 NOTE — Assessment & Plan Note (Signed)
Slow to resolve flare complicated by CAP -slowly improved  Hold on additional steroids  Xopenex neb given in office x 1   Plan   Begin Dulera 2 puffs Twice daily  Until sample is gone-brush/rinse and gargle after use.  Mucinex DM twice daily as needed. For cough and congestion. Fluids and rest. Saline nasal rinses As needed   Follow up Dr. Halford Chessman  In 3 months and .As needed   Please contact office for sooner follow up if symptoms do not improve or worsen or seek emergency care

## 2013-08-21 NOTE — Addendum Note (Signed)
Addended by: Parke Poisson E on: 08/21/2013 12:14 PM   Modules accepted: Orders

## 2013-08-21 NOTE — Patient Instructions (Signed)
Begin Dulera 2 puffs Twice daily  Until sample is gone-brush/rinse and gargle after use.  Mucinex DM twice daily as needed. For cough and congestion. Fluids and rest. Saline nasal rinses As needed   Wear  BIPAP At bedtime  .  Order sent for new BIPAP . follow up Dr. Halford Chessman  In 3 months and .As needed   Please contact office for sooner follow up if symptoms do not improve or worsen or seek emergency care

## 2013-08-21 NOTE — Assessment & Plan Note (Signed)
RLL PNA improved w/ clearance on cxr   Plan  Mucinex DM twice daily as needed. For cough and congestion. Fluids and rest. Saline nasal rinses As needed   follow up Dr. Halford Chessman  In 3 months and .As needed   Please contact office for sooner follow up if symptoms do not improve or worsen or seek emergency care

## 2013-08-21 NOTE — Progress Notes (Signed)
Subjective:    Patient ID: Carlos Dawson, male    DOB: 1940/03/15, 73 y.o.   MRN: [id number]  HPI  73 yo former smoker with dyspnea, OSA on BPAP 12/8, and asthma.  08/21/2013 Follow up  Patient returns for a one-week followup for pneumonia and asthma flare .  He was seen last week, with fever, cough, and congestion. Chest x-ray showed a right lower lobe pneumonia. He was treated with Avelox for 7 day and pred pack.  Patient returns today feeling better w/ less wheezing and cough . Patient still has some congestion. That is clear to yellow  With postnasal drip. Chest x-ray today shows a clearing of his right lower lobe pneumonia. Patient denies any hemoptysis, orthopnea, PND, or leg swelling. Patient has not been able to wear BIPAP as much d/t cough . We discussed using Mucinex DM and cough medicines, to help control. Cough symptoms. Patient encouraged to wear BiPAP. Each night.    Tests: PSG 03/30/97 >> AHI 21 ONO with BPAP and room air 12/12/10 >> Test time 6 hrs 56 min.  Basal SpO2 93.1%, low SpO2 86%.  Spent 2.8 min with SpO2 < 88%. PFT 12/20/10 >> FEV1 2.13(79%), FEV1% 71, TLC 5.80(100%), DLCO 57%, no BD CT chest 09/14/11 >> Stable dilatation of ascending thoracic aorta 4.7 cm, PA 4.1 cm BPAP 09/30/11 to 11/28/11>>Used on 29 of 60 nights with average 3 hrs 21 min. Average AHI 3 with BiPAP 13/9 cm H2O.    Review of Systems  Constitutional:   No  weight loss, night sweats,  Fevers, chills, fatigue, or  lassitude.  HEENT:   No headaches,  Difficulty swallowing,  Tooth/dental problems, or  Sore throat,                No sneezing, itching, ear ache,  ++nasal congestion, post nasal drip,   CV:  No chest pain,  Orthopnea, PND, swelling in lower extremities, anasarca, dizziness, palpitations, syncope.   GI  No heartburn, indigestion, abdominal pain, nausea, vomiting, diarrhea, change in bowel habits, loss of appetite, bloody stools.   Resp:   No chest wall deformity  Skin: no  rash or lesions.  GU: no dysuria, change in color of urine, no urgency or frequency.  No flank pain, no hematuria   MS:  No joint pain or swelling.  No decreased range of motion.  No back pain.  Psych:  No change in mood or affect. No depression or anxiety.  No memory loss.          Objective:   Physical Exam  GEN: A/Ox3; pleasant , NAD, elderly , w/ barking cough  HEENT:  [employer]/AT,  EACs-clear, TMs-wnl, NOSE-clear, THROAT-clear, no lesions, no postnasal drip or exudate noted.   NECK:  Supple w/ fair ROM; no JVD; normal carotid impulses w/o bruits; no thyromegaly or nodules palpated; no lymphadenopathy.  RESP  Improved aeration w/ less wheezing , few faint exp wheezes.  no accessory muscle use, no dullness to percussion  CARD:  RRR, no m/r/g  , no peripheral edema, pulses intact, no cyanosis or clubbing.  GI:   Soft & nt; nml bowel sounds; no organomegaly or masses detected.  Musco: Warm bil, no deformities or joint swelling noted.   Neuro: alert, no focal deficits noted.    Skin: Warm, no lesions or rashes   08/21/2013 CXR >Clearing of right lower lobe airspace disease/pneumonia. Cardiomegaly with chronic interstitial thickening, likely related to smoking or chronic bronchitis.  Assessment & Plan:

## 2013-08-26 ENCOUNTER — Ambulatory Visit: Payer: Medicare Other | Admitting: Adult Health

## 2013-09-04 ENCOUNTER — Other Ambulatory Visit: Payer: Self-pay | Admitting: Cardiology

## 2013-09-04 ENCOUNTER — Other Ambulatory Visit: Payer: Medicare Other

## 2013-09-04 ENCOUNTER — Ambulatory Visit: Payer: Medicare Other | Admitting: Cardiothoracic Surgery

## 2013-09-04 NOTE — Telephone Encounter (Signed)
E sentrx 

## 2013-09-11 ENCOUNTER — Ambulatory Visit
Admission: RE | Admit: 2013-09-11 | Discharge: 2013-09-11 | Disposition: A | Discharge status: Home / Self Care | Payer: Medicare Other | Source: Ambulatory Visit | Attending: Cardiothoracic Surgery | Admitting: Cardiothoracic Surgery

## 2013-09-11 ENCOUNTER — Telehealth: Payer: Self-pay | Admitting: Adult Health

## 2013-09-11 ENCOUNTER — Encounter: Payer: Self-pay | Admitting: Cardiothoracic Surgery

## 2013-09-11 ENCOUNTER — Ambulatory Visit (INDEPENDENT_AMBULATORY_CARE_PROVIDER_SITE_OTHER): Payer: Medicare Other | Admitting: Cardiothoracic Surgery

## 2013-09-11 VITALS — BP 122/73 | HR 69 | Resp 20 | Ht 67.0 in | Wt 287.0 lb

## 2013-09-11 DIAGNOSIS — I2510 Atherosclerotic heart disease of native coronary artery without angina pectoris: Secondary | ICD-10-CM

## 2013-09-11 DIAGNOSIS — I712 Thoracic aortic aneurysm, without rupture, unspecified: Secondary | ICD-10-CM

## 2013-09-11 LAB — CREATININE, SERUM: Creat: 1.1 mg/dL (ref 0.50–1.35)

## 2013-09-11 LAB — BUN: BUN: 18 mg/dL (ref 6–23)

## 2013-09-11 MED ORDER — IOHEXOL 350 MG/ML SOLN
80.0000 mL | Freq: Once | INTRAVENOUS | Status: AC | PRN
  Administered 2013-09-11: 80 mL via INTRAVENOUS

## 2013-09-11 NOTE — Telephone Encounter (Signed)
I sent a message to [facility] to see what the issue is with the new bipap order that was sent on 08-21-13. Pt aware we will let him know what we find out. [place] Bing, CMA

## 2013-09-11 NOTE — Progress Notes (Signed)
[address]       [place] [address]             [phone]                   Torie C Vanriper [place] Medical Record #[id number] Date of Birth: [birth year]  Referring: [place], [name], MD Primary Care: Henrine Screws, MD  Chief Complaint:    Chief Complaint  Patient presents with  . Follow-up    1 year f/u with CTA Chest    History of Present Illness:    Patient returns today for followup CT scan one year after previous scan for evaluation of dilated ascending  Aorta. He  has known CAD with  a stent was placed in his circumflex coronary artery in the past. He denies any angina at this time but does have some exertional shortness of breath. He has no angina as he had before coronary stent. The patient's respiratory status compromise is his overall physical activity. He was just seen by pulmonary several weeks ago for COPD exacerbation/pneumonia. He still might shortness of breath with activity , at times he gets short of breath laying down.    Current Activity/ Functional Status: Patient is independent with mobility/ambulation, transfers, ADL's, IADL's.   Past Medical History  Diagnosis Date  . Hypertension   . Obesities, morbid   . Pulmonary embolism     2008  . Anticoagulant long-term use   . CAD (coronary artery disease)     a. s/p BMS to 2nd OM in Sept 2012; b. LexiScan Myoview (12/2012):  Inf infarct; bowel and motion artifact make study difficult to interpret; no ischemia; not gated; Low Risk  . Thoracic aortic aneurysm     followed by Dr. Servando Snare; last CT November 11, 2010 and unchanged  . OA (osteoarthritis)   . LVH (left ventricular hypertrophy)   . Colonic polyp   . Mild intermittent asthma   . Hemorrhoids   . Generalized headaches   . SOB (shortness of breath)     using oxygen with exercise  . Diastolic dysfunction   . ASCVD (arteriosclerotic cardiovascular disease)     Prior BMS to the 2nd OM in September 2012; with  repeat cath in October showing patency  . GERD (gastroesophageal reflux disease)   . IBS (irritable bowel syndrome)   . Aortic root enlargement   . Ascending aortic aneurysm     recent scan in October 2012 showing no change; followed by Dr. Servando Snare  . PAF (paroxysmal atrial fibrillation)     treated with Coumadin  . Hearing loss   . Contact lens/glasses fitting   . OSA (obstructive sleep apnea)     PSG 03/30/97 AHI 21, BPAP 13/9  . Sleep apnea     Past Surgical History  Procedure Laterality Date  . Hemorroidectomy    . Vasectomy    . Cervical spine surgery  06/02/2010    lower back and neck  . Gastric bypass  05/27/2007  . Cholecystectomy    . Rotator cuff repair      both  . Achilles tendon repair    . Cardiac catheterization  2006  . Coronary stent placement  Sept 2012    2nd OM with BMS  . Cardiac catheterization  October 2012    Stent patent  . Appendectomy    . Eye surgery      bilateral cataract  . Laminectomy  05/30/2012    L 4 L5    Family History  Problem Relation Age of Onset  . Heart disease Mother   . Diabetes Mother   . Other Mother     stent placement  . Emphysema Father 44  . Heart attack Sister     History   Social History  . Marital Status: Married    Spouse Name: N/A    Number of Children: 3  . Years of Education: N/A   Occupational History  . Retired from [employer]    Social History Main Topics  . Smoking status: Former Smoker -- 1.50 packs/day for 30 years    Quit date: 01/24/1992  . Smokeless tobacco: Never Used     Comment: Stopped in 1983  . Alcohol Use: No  . Drug Use: No  . Sexual Activity: Yes   Other Topics Concern  . Not on file   Social History Narrative  . No narrative on file    History  Smoking status  . Former Smoker -- 1.50 packs/day for 30 years  . Quit date: 01/24/1992  Smokeless tobacco  . Never Used    Comment: Stopped in 1983    History  Alcohol Use No     Allergies  Allergen Reactions  .  Morphine Itching  . Adhesive [Tape] Itching and Rash    Where applied.    Current Outpatient Prescriptions  Medication Sig Dispense Refill  . acetaminophen (TYLENOL) 325 MG tablet Take 650 mg by mouth every 6 (six) hours as needed.      Marland Kitchen albuterol (PROVENTIL HFA;VENTOLIN HFA) 108 (90 BASE) MCG/ACT inhaler Inhale 2 puffs into the lungs every 6 (six) hours as needed for wheezing.      Marland Kitchen atenolol (TENORMIN) 25 MG tablet Take 25 mg by mouth daily.      Marland Kitchen atorvastatin (LIPITOR) 20 MG tablet Take 1 tablet (20 mg total) by mouth daily.  90 tablet  3  . cetirizine (ZYRTEC) 10 MG tablet Take 10 mg by mouth daily as needed for allergies.      . Cholecalciferol (VITAMIN D-3) 5000 UNITS TABS Take 5,000 Units by mouth daily.      . finasteride (PROSCAR) 5 MG tablet Take 5 mg by mouth daily.      . fluticasone (FLONASE) 50 MCG/ACT nasal spray Place 2 sprays into the nose daily as needed for rhinitis.      . furosemide (LASIX) 80 MG tablet Take 80 mg by mouth 2 (two) times daily.      Marland Kitchen HYDROcodone-homatropine (HYCODAN) 5-1.5 MG/5ML syrup 1-2 teaspoons every 6 hours as needed. For cough and congestion.  240 mL  0  . hydrOXYzine (VISTARIL) 25 MG capsule Take 25 mg by mouth as needed for itching. As needed      . KLOR-CON M20 20 MEQ tablet TAKE 2 TABLETS BY MOUTH TWICE DAILY  120 tablet  6  . montelukast (SINGULAIR) 10 MG tablet Take 10 mg by mouth at bedtime.      . nitroGLYCERIN (NITROSTAT) 0.4 MG SL tablet Place 0.4 mg under the tongue every 5 (five) minutes as needed for chest pain.      . pantoprazole (PROTONIX) 40 MG tablet Take 40 mg by mouth daily.      . ranolazine (RANEXA) 500 MG 12 hr tablet Take 1 tablet (500 mg total) by mouth 2 (two) times daily.  180 tablet  3  . rivaroxaban (XARELTO) 20 MG TABS tablet TAKE 1 TABLET BY MOUTH EVERY  DAY  90 tablet  1  . spironolactone (ALDACTONE) 25 MG tablet Take 1 tab daily      . tamsulosin (FLOMAX) 0.4 MG CAPS Take 0.4 mg by mouth daily.      . traMADol  (ULTRAM) 50 MG tablet Take 50 mg by mouth every 6 (six) hours as needed for pain.      . valsartan (DIOVAN) 80 MG tablet Take 80 mg by mouth every evening.       No current facility-administered medications for this visit.       Review of Systems:     Cardiac Review of Systems: Y or N  Chest Pain [  n  ]  Resting SOB [n   ] Exertional SOB  Blue.Reese  ]  Orthopnea [ n ]   Pedal Edema [  n ]    Palpitations [n  ] Syncope  [ n]   Presyncope [ n  ]  General Review of Systems: [Y] = yes [  ]=no Constitional: recent weight change [ y]; anorexia [  ]; fatigue [ y ]; nausea [n  ]; night sweats [ n ]; fever [ n ]; or chills [n  ];                                                                                                                                          Dental: poor dentition[  ];   Eye : blurred vision [  ]; diplopia [   ]; vision changes [  ];  Amaurosis fugax[ n ]; Resp: cough [ n ];  wheezing[n  ];  hemoptysis[n  ]; shortness of breath[ y ]; paroxysmal nocturnal dyspnea[  ]; dyspnea on exertion[ y ]; or orthopnea[  ];  GI:  gallstones[  ], vomiting[  ];  dysphagia[  ]; melena[  ];  hematochezia [ n ]; heartburn[  ];   Hx of  Colonoscopy[ y ]; GU: kidney stones [  ]; hematuria[  ];   dysuria [  ];  nocturia[  ];  history of     obstruction [  ];             Skin: rash, swelling[  ];, hair loss[  ];  peripheral edema[  ];  or itching[  ]; Musculosketetal: myalgias[  ];  joint swelling[  ];  joint erythema[  ];  joint pain[  ];  back pain[  ];  Heme/Lymph: bruising[  ];  bleeding[  ];  anemia[  ];  Neuro: TIA[  ];  headaches[  ];  stroke[  ];  vertigo[  ];  seizures[  ];   paresthesias[  ];  difficulty walking[n  ];  Psych:depression[  ]; anxiety[  ];  Endocrine: diabetes[  ];  thyroid dysfunction[  ];  Immunizations: Flu Blue.Reese  ]; Pneumococcal[ y];  Other:  Physical Exam: BP 122/73  Pulse 69  Resp 20  Ht 5\' 7"  (1.702 m)  Wt 287 lb (130.182 kg)  BMI 44.94 kg/m2  SpO2 96%  General  appearance: alert, cooperative, appears older than stated age and fatigued Neurologic: intact Heart: Irregular irregular pulse -afib, S1, S2 normal, no murmur, click, rub or gallop and normal apical impulse Lungs: clear to auscultation bilaterally and normal percussion bilaterally Abdomen: soft, non-tender; bowel sounds normal; no masses,  no organomegaly Extremities: no edema at ankles currently    Diagnostic Studies & Laboratory data:     Recent Radiology Findings:  Dg Chest 2 View  08/21/2013   CLINICAL DATA:  Followup of pneumonia. Productive cough. Congestion. Asthma. Ex-smoker.  EXAM: CHEST  2 VIEW  COMPARISON:  08/12/2013 and baseline radiograph of 05/21/2013  FINDINGS: Moderate thoracic spondylosis. Midline trachea. Moderate cardiomegaly. Pulmonary artery enlargement. Mediastinal contours otherwise within normal limits. No pleural effusion or pneumothorax. Lower lobe predominant interstitial thickening. Clearing of right lower lobe pneumonia.  IMPRESSION: Clearing of right lower lobe airspace disease/pneumonia.  Cardiomegaly with chronic interstitial thickening, likely related to smoking or chronic bronchitis.  Pulmonary artery enlargement suggests pulmonary arterial hypertension.   Electronically Signed   By: Abigail Miyamoto M.D.   On: 08/21/2013 09:47   Ct Angio Chest Aorta W/cm &/or Wo/cm  09/11/2013   CLINICAL DATA:  Thoracic aortic aneurysm.  EXAM: CT ANGIOGRAPHY CHEST WITH CONTRAST  TECHNIQUE: Multidetector CT imaging of the chest was performed using the standard protocol during bolus administration of intravenous contrast. Multiplanar CT image reconstructions and MIPs were obtained to evaluate the vascular anatomy.  CONTRAST:  98mL OMNIPAQUE IOHEXOL 350 MG/ML SOLN  COMPARISON:  01/06/2013 and 09/14/2011  FINDINGS: Lungs are adequately inflated without consolidation effusion. There is minimal linear scarring over the anterior right upper lobe unchanged. 4 mm nodular density adjacent the  minor fissure unchanged from 2013. There is borderline cardiomegaly. Continued minimal calcification over the left main, lateral circumflex and anterior descending coronary arteries. No change in a right subcarinal lymph node measuring 1 cm by short axis.  There is continued evidence of aneurysmal dilatation of the ascending thoracic aorta which measures 4.8 cm in greatest AP diameter proximally which is unchanged. The thoracic aorta measures 4 cm in diameter at the level of the proximal arch without significant change. There is a relative area of narrowing of the posterior aortic arch 1.9 cm distal to the take-off of the left subclavian artery as the aorta measures 2.1 cm in diameter focally and increases in diameter to 3.2 cm immediately distal to this narrowing. Main pulmonary artery measures 4 cm in transverse diameter unchanged. Remaining mediastinal structures are unremarkable.  Images through the upper abdomen demonstrate evidence of prior cholecystectomy as well as surgical change over the region of the gastroesophageal junction with lap band apparatus in place. There are degenerative changes of the spine.  Review of the MIP images confirms the above findings.  IMPRESSION: Stable aneurysmal dilatation of the ascending thoracic aorta measuring 4.8 cm in AP diameter. Note that there is a focal relative area of narrowing of the aortic distal to the take-off of the left subclavian artery measuring 2.1 cm in diameter likely representing a degree of coarctation unchanged. No evidence of dissection.  Stable borderline cardiomegaly.  Postsurgical changes as described.   Electronically Signed   By: Marin Olp M.D.   On: 09/11/2013 11:07    Recent Lab Findings: Lab Results  Component Value Date   WBC 6.7 01/06/2013   HGB 13.9 01/06/2013  HCT 41.8 01/06/2013   PLT 172 01/06/2013   GLUCOSE 99 01/07/2013   CHOL 118 01/07/2013   TRIG 133 01/07/2013   HDL 40 01/07/2013   LDLCALC 51 01/07/2013   ALT 13  01/06/2013   AST 18 01/06/2013   NA 140 01/07/2013   K 3.8 01/07/2013   CL 104 01/07/2013   CREATININE 1.10 09/11/2013   BUN 18 08/11/2013   CO2 28 01/07/2013   TSH 1.686 01/06/2013   INR 2.9 03/03/2013   HGBA1C  Value: 6.7 (NOTE)   The ADA recommends the following therapeutic goals for glycemic   control related to Hgb A1C measurement:   Goal of Therapy:   < 7.0% Hgb A1C   Action Suggested:  > 8.0% Hgb A1C   Ref:  Diabetes Care, 22, Suppl. 1, 1999* 08/19/2006   Aortic Size Index=     5.0    /Body surface area is 2.48 meters squared. = 2.05  < 2.75 cm/m2      4% risk per year 2.75 to 4.25          8% risk per year > 4.25 cm/m2    20% risk per year   Assessment / Plan:     Stable appearance of the ascending aorta dilatation. Still 4.7-5 cm. With the patient's overall poor medical condition I would not recommend elective repair at this point. We'll plan to see the patient back in one year with a followup CTA of the chest Grace Isaac MD  Beeper [phone] Office [phone] 09/11/2013 10:53 AM

## 2013-09-12 ENCOUNTER — Encounter (INDEPENDENT_AMBULATORY_CARE_PROVIDER_SITE_OTHER): Payer: Medicare Other | Admitting: Surgery

## 2013-09-12 DIAGNOSIS — E559 Vitamin D deficiency, unspecified: Secondary | ICD-10-CM | POA: Diagnosis not present

## 2013-09-12 DIAGNOSIS — Z0000 Encounter for general adult medical examination without abnormal findings: Secondary | ICD-10-CM | POA: Diagnosis not present

## 2013-09-12 DIAGNOSIS — E119 Type 2 diabetes mellitus without complications: Secondary | ICD-10-CM | POA: Diagnosis not present

## 2013-09-12 DIAGNOSIS — I10 Essential (primary) hypertension: Secondary | ICD-10-CM | POA: Diagnosis not present

## 2013-09-12 DIAGNOSIS — M545 Low back pain, unspecified: Secondary | ICD-10-CM | POA: Diagnosis not present

## 2013-09-12 DIAGNOSIS — I509 Heart failure, unspecified: Secondary | ICD-10-CM | POA: Diagnosis not present

## 2013-09-12 DIAGNOSIS — E785 Hyperlipidemia, unspecified: Secondary | ICD-10-CM | POA: Diagnosis not present

## 2013-09-12 DIAGNOSIS — Z1331 Encounter for screening for depression: Secondary | ICD-10-CM | POA: Diagnosis not present

## 2013-09-12 DIAGNOSIS — R350 Frequency of micturition: Secondary | ICD-10-CM | POA: Diagnosis not present

## 2013-09-12 DIAGNOSIS — I4891 Unspecified atrial fibrillation: Secondary | ICD-10-CM | POA: Diagnosis not present

## 2013-09-12 DIAGNOSIS — R195 Other fecal abnormalities: Secondary | ICD-10-CM | POA: Diagnosis not present

## 2013-09-12 NOTE — Telephone Encounter (Signed)
[name] called back. She reports she is working on pt order. She reports they are having trouble locating all of pt documentation. They can't find his sleep study showing he failed CPAP. She is needing me to order pt chart to see if it is it. Chart has been ordered Pt aware we are still working on this.

## 2013-09-15 NOTE — Telephone Encounter (Signed)
Chart received.  Sleep study copied and placed to be scanned into pt chart.  Spoke with Rolena Infante that this to be faxed  Fax # [phone] ATTN: Jiles Crocker  Pt aware as well. Nothing further needed.

## 2013-09-16 ENCOUNTER — Telehealth: Payer: Self-pay | Admitting: Pulmonary Disease

## 2013-09-16 DIAGNOSIS — G4733 Obstructive sleep apnea (adult) (pediatric): Secondary | ICD-10-CM

## 2013-09-16 NOTE — Telephone Encounter (Signed)
lmomtcb x 1 for [place] from [facility]

## 2013-09-16 NOTE — Telephone Encounter (Signed)
I spoke with the pt and he states he thought yesterday when he was speaking with a nurse he thought she said cpap and he uses a bipap. I advised that the order has been placed for a Bipap. Nothing further needed. [place] Bing, CMA

## 2013-09-17 NOTE — Telephone Encounter (Signed)
Per phone msg from 09/11/13, paper chart was pulled for pt's original sleep study.  [name] states they did receive these results, but the original study was for cpap not bipap.  [facility] will need documentation showing where pt failed cpap and was changed to bipap.  [facility] was unable to find this information in Epic and is requesting we pull pt's paper chart again to see if we can find this documentation.  If unable to find, pt will need to have the titration study.  Chart requested --- will await.

## 2013-09-19 ENCOUNTER — Encounter: Payer: Self-pay | Admitting: Cardiology

## 2013-09-19 ENCOUNTER — Ambulatory Visit (INDEPENDENT_AMBULATORY_CARE_PROVIDER_SITE_OTHER): Payer: Medicare Other | Admitting: Cardiology

## 2013-09-19 VITALS — BP 94/60 | HR 62 | Ht 67.0 in | Wt 277.5 lb

## 2013-09-19 DIAGNOSIS — I2510 Atherosclerotic heart disease of native coronary artery without angina pectoris: Secondary | ICD-10-CM | POA: Diagnosis not present

## 2013-09-19 DIAGNOSIS — I712 Thoracic aortic aneurysm, without rupture, unspecified: Secondary | ICD-10-CM | POA: Diagnosis not present

## 2013-09-19 DIAGNOSIS — I10 Essential (primary) hypertension: Secondary | ICD-10-CM | POA: Diagnosis not present

## 2013-09-19 DIAGNOSIS — I4891 Unspecified atrial fibrillation: Secondary | ICD-10-CM

## 2013-09-19 DIAGNOSIS — I480 Paroxysmal atrial fibrillation: Secondary | ICD-10-CM

## 2013-09-19 DIAGNOSIS — I5032 Chronic diastolic (congestive) heart failure: Secondary | ICD-10-CM

## 2013-09-19 NOTE — Patient Instructions (Signed)
Stop Diovan   Reduce Lasix to 80 mg daily  Monitor your blood pressure at home.  I will see you in 6 months.

## 2013-09-19 NOTE — Progress Notes (Signed)
Carlos Dawson Date of Birth: 05/22/1940 Medical Record #[id number]  History of Present Illness: Carlos Dawson is seen today for followup. He has multiple medical issues including CAD, thoracic aneurysm, pulmonary emboli, morbid obesity with prior lap banding, HTN, chronic edema and PAF. He he is on chronic Xarelto. He underwent stenting of the obtuse marginal vessel in September of 2012. On follow up today he reports he was treated for RLL PNA in July. Still has mild cough and SOB. No increase in edema. Denies dizziness or chest pain but does feel weak.  Current Outpatient Prescriptions on File Prior to Visit  Medication Sig Dispense Refill  . acetaminophen (TYLENOL) 325 MG tablet Take 650 mg by mouth every 6 (six) hours as needed.      Marland Kitchen albuterol (PROVENTIL HFA;VENTOLIN HFA) 108 (90 BASE) MCG/ACT inhaler Inhale 2 puffs into the lungs every 6 (six) hours as needed for wheezing.      Marland Kitchen atenolol (TENORMIN) 25 MG tablet Take 25 mg by mouth daily.      Marland Kitchen atorvastatin (LIPITOR) 20 MG tablet Take 1 tablet (20 mg total) by mouth daily.  90 tablet  3  . cetirizine (ZYRTEC) 10 MG tablet Take 10 mg by mouth daily as needed for allergies.      . Cholecalciferol (VITAMIN D-3) 5000 UNITS TABS Take 5,000 Units by mouth daily.      . finasteride (PROSCAR) 5 MG tablet Take 5 mg by mouth daily.      . fluticasone (FLONASE) 50 MCG/ACT nasal spray Place 2 sprays into the nose daily as needed for rhinitis.      . furosemide (LASIX) 80 MG tablet Take 80 mg by mouth 2 (two) times daily.      Marland Kitchen HYDROcodone-homatropine (HYCODAN) 5-1.5 MG/5ML syrup 1-2 teaspoons every 6 hours as needed. For cough and congestion.  240 mL  0  . hydrOXYzine (VISTARIL) 25 MG capsule Take 25 mg by mouth as needed for itching. As needed      . KLOR-CON M20 20 MEQ tablet TAKE 2 TABLETS BY MOUTH TWICE DAILY  120 tablet  6  . montelukast (SINGULAIR) 10 MG tablet Take 10 mg by mouth at bedtime.      . nitroGLYCERIN (NITROSTAT) 0.4 MG SL tablet Place  0.4 mg under the tongue every 5 (five) minutes as needed for chest pain.      . pantoprazole (PROTONIX) 40 MG tablet Take 40 mg by mouth daily.      . ranolazine (RANEXA) 500 MG 12 hr tablet Take 1 tablet (500 mg total) by mouth 2 (two) times daily.  180 tablet  3  . rivaroxaban (XARELTO) 20 MG TABS tablet TAKE 1 TABLET BY MOUTH EVERY DAY  90 tablet  1  . spironolactone (ALDACTONE) 25 MG tablet Take 1 tab daily      . tamsulosin (FLOMAX) 0.4 MG CAPS Take 0.4 mg by mouth daily.      . traMADol (ULTRAM) 50 MG tablet Take 50 mg by mouth every 6 (six) hours as needed for pain.       No current facility-administered medications on file prior to visit.    Allergies  Allergen Reactions  . Morphine Itching  . Adhesive [Tape] Itching and Rash    Where applied.    Past Medical History  Diagnosis Date  . Hypertension   . Obesities, morbid   . Pulmonary embolism     2008  . Anticoagulant long-term use   . CAD (coronary artery disease)  a. s/p BMS to 2nd OM in Sept 2012; b. LexiScan Myoview (12/2012):  Inf infarct; bowel and motion artifact make study difficult to interpret; no ischemia; not gated; Low Risk  . Thoracic aortic aneurysm     followed by Carlos Dawson; last CT November 11, 2010 and unchanged  . OA (osteoarthritis)   . LVH (left ventricular hypertrophy)   . Colonic polyp   . Mild intermittent asthma   . Hemorrhoids   . Generalized headaches   . SOB (shortness of breath)     using oxygen with exercise  . Diastolic dysfunction   . ASCVD (arteriosclerotic cardiovascular disease)     Prior BMS to the 2nd OM in September 2012; with repeat cath in October showing patency  . GERD (gastroesophageal reflux disease)   . IBS (irritable bowel syndrome)   . Aortic root enlargement   . Ascending aortic aneurysm     recent scan in October 2012 showing no change; followed by Carlos Dawson  . PAF (paroxysmal atrial fibrillation)     treated with Coumadin  . Hearing loss   . Contact  lens/glasses fitting   . OSA (obstructive sleep apnea)     PSG 03/30/97 AHI 21, BPAP 13/9  . Sleep apnea     Past Surgical History  Procedure Laterality Date  . Hemorroidectomy    . Vasectomy    . Cervical spine surgery  06/02/2010    lower back and neck  . Gastric bypass  05/27/2007  . Cholecystectomy    . Rotator cuff repair      both  . Achilles tendon repair    . Cardiac catheterization  2006  . Coronary stent placement  Sept 2012    2nd OM with BMS  . Cardiac catheterization  October 2012    Stent patent  . Appendectomy    . Eye surgery      bilateral cataract  . Laminectomy  05/30/2012    L 4 L5    History  Smoking status  . Former Smoker -- 1.50 packs/day for 30 years  . Quit date: 01/24/1992  Smokeless tobacco  . Never Used    Comment: Stopped in 1983    History  Alcohol Use No    Family History  Problem Relation Age of Onset  . Heart disease Mother   . Diabetes Mother   . Other Mother     stent placement  . Emphysema Father 76  . Heart attack Sister     Review of Systems: The review of systems is per the HPI. All other systems were reviewed and are negative.  Physical Exam: BP 94/60  Pulse 62  Ht 5\' 7"  (1.702 m)  Wt 277 lb 8 oz (125.873 kg)  BMI 43.45 kg/m2 I checked BP and it was 84/50 in the left arm Patient is a pleasant obese white male no acute distress. He is morbidly obese. Skin is warm and dry. Color is normal.  HEENT is unremarkable. Normocephalic/atraumatic. PERRL. Sclera are nonicteric. Neck is supple. No masses. No JVD. Lungs are clear. Cardiac exam shows a regular rate and rhythm. No gallop or murmur. Abdomen is obese but soft. Bowel sounds are positive. Extremities are full but without significant edema. Gait and ROM are intact. No gross neurologic deficits noted.   LABORATORY DATA:  Lab Results  Component Value Date   WBC 6.7 01/06/2013   HGB 13.9 01/06/2013   HCT 41.8 01/06/2013   PLT 172 01/06/2013   GLUCOSE 99 01/07/2013  CHOL 118 01/07/2013   TRIG 133 01/07/2013   HDL 40 01/07/2013   LDLCALC 51 01/07/2013   ALT 13 01/06/2013   AST 18 01/06/2013   NA 140 01/07/2013   K 3.8 01/07/2013   CL 104 01/07/2013   CREATININE 1.10 09/11/2013   BUN 18 08/11/2013   CO2 28 01/07/2013   TSH 1.686 01/06/2013   INR 2.9 03/03/2013   HGBA1C  Value: 6.7 (NOTE)   The ADA recommends the following therapeutic goals for glycemic   control related to Hgb A1C measurement:   Goal of Therapy:   < 7.0% Hgb A1C   Action Suggested:  > 8.0% Hgb A1C   Ref:  Diabetes Care, 22, Suppl. 1, 1999* 08/19/2006   Lab Results  Component Value Date   CHOL 118 01/07/2013    CT ANGIOGRAPHY CHEST WITH CONTRAST  TECHNIQUE:  Multidetector CT imaging of the chest was performed using the  standard protocol during bolus administration of intravenous  contrast. Multiplanar CT image reconstructions and MIPs were  obtained to evaluate the vascular anatomy.  CONTRAST: 5mL OMNIPAQUE IOHEXOL 350 MG/ML SOLN  COMPARISON: 01/06/2013 and 09/14/2011  FINDINGS:  Lungs are adequately inflated without consolidation effusion. There  is minimal linear scarring over the anterior right upper lobe  unchanged. 4 mm nodular density adjacent the minor fissure unchanged  from 2013. There is borderline cardiomegaly. Continued minimal  calcification over the left main, lateral circumflex and anterior  descending coronary arteries. No change in a right subcarinal lymph  node measuring 1 cm by short axis.  There is continued evidence of aneurysmal dilatation of the  ascending thoracic aorta which measures 4.8 cm in greatest AP  diameter proximally which is unchanged. The thoracic aorta measures  4 cm in diameter at the level of the proximal arch without  significant change. There is a relative area of narrowing of the  posterior aortic arch 1.9 cm distal to the take-off of the left  subclavian artery as the aorta measures 2.1 cm in diameter focally  and increases in  diameter to 3.2 cm immediately distal to this  narrowing. Main pulmonary artery measures 4 cm in transverse  diameter unchanged. Remaining mediastinal structures are  unremarkable.  Images through the upper abdomen demonstrate evidence of prior  cholecystectomy as well as surgical change over the region of the  gastroesophageal junction with lap band apparatus in place. There  are degenerative changes of the spine.  Review of the MIP images confirms the above findings.  IMPRESSION:  Stable aneurysmal dilatation of the ascending thoracic aorta  measuring 4.8 cm in AP diameter. Note that there is a focal relative  area of narrowing of the aortic distal to the take-off of the left  subclavian artery measuring 2.1 cm in diameter likely representing a  degree of coarctation unchanged. No evidence of dissection.  Stable borderline cardiomegaly.  Postsurgical changes as described.  Electronically Signed  By: Marin Olp M.D.  On: 09/11/2013 11:07  Assessment / Plan:  1. CAD - prior bare-metal stent of the OM in 2012. No recurrent anginal pain.   2. Diastolic dysfunction -  Looks well compensated. Will reduce Lasix 80 mg daily. Continue sodium restriction.  3. HTN -  Blood pressure is very low today. Will reduce lasix dose and hold Diovan. Patient to monitor BP at home.   4. Morbid obesity  5. Thoracic aneurysm -followed by Carlos Dawson annually. Recent CT showed stable appearance.  6. PAF - now on Xarelto.  7.  HLD

## 2013-09-24 NOTE — Telephone Encounter (Signed)
i have called and lmomtcb for melissa to discuss pt with her.

## 2013-09-25 NOTE — Telephone Encounter (Signed)
Spoke with [name] from [facility] and she stated that she spoke with the pt and he will be out of town from 62th -11th.  He will be back on 12th.  She stated that the pt confirmed that while he was in the hospital they changed him from cpap to bipap and sent him home with this machine.  He now has medicare for his insurance and they require that he be tested on the bipap.  [name] stated that he will have to go to the sleep lab for this testing.  VS please advise thanks.

## 2013-09-25 NOTE — Telephone Encounter (Signed)
[facility], can you please help with this? Is this a medicare requirement or an [facility] requirement? Has this pt been on bipap for years? Please help. Thanks. [place] Bing, CMA

## 2013-09-25 NOTE — Telephone Encounter (Signed)
I am not sure what [facility] is referring to.  According to my information pt should already be using BiPAP and has been using BiPAP for years.  As such I do not understand why they feel he needs a new sleep study.  Please confirm this is a real requirement to have repeat sleep study done, and not some made up rule from his DME.

## 2013-10-01 NOTE — Telephone Encounter (Signed)
Just do whatever the insurance company wants to pay for to get him set up for BiPAP.

## 2013-10-01 NOTE — Telephone Encounter (Signed)
[facility] closed (5:30pm) Will call back tomorrow

## 2013-10-01 NOTE — Telephone Encounter (Signed)
Pt had split night study back in 2010 per [facility], but did not meet the criteria and therefore, pt was not placed on cpap.  Pt stated that also back in 2010 when pt was in the hospital, the hospital placed him on a bipap at that time b/c the hospital didn't have a cpap available. Pt noted at that time, that he did better with the two pressures, however, patient never actually qualified for the bipap. Pt went home with the bipap from the hospital and was never billed for this per [facility].  Therefore for patient to obtain a bipap, he would need another sleep study to qualify for a bipap.  Please advise. Rhonda J Cobb

## 2013-10-02 NOTE — Telephone Encounter (Signed)
Spoke with Suanne Marker, [facility] who was misinformed by Lynnae Sandhoff. The pt never had a sleep study in 2010. The only study the pt had was in 1999, Lenna Sciara has this study already. Called and spoke to [facility] and informed her of the confusion. Per Suanne Marker the order will need to be placed as a split night and "if/when the pt fails cpap quickly transition pt to Bipap". Order placed. [name] aware. Pt aware.

## 2013-10-03 DIAGNOSIS — R946 Abnormal results of thyroid function studies: Secondary | ICD-10-CM | POA: Diagnosis not present

## 2013-10-09 ENCOUNTER — Encounter (INDEPENDENT_AMBULATORY_CARE_PROVIDER_SITE_OTHER): Payer: Medicare Other | Admitting: Surgery

## 2013-10-09 DIAGNOSIS — Z9884 Bariatric surgery status: Secondary | ICD-10-CM | POA: Diagnosis not present

## 2013-10-17 DIAGNOSIS — I10 Essential (primary) hypertension: Secondary | ICD-10-CM | POA: Diagnosis not present

## 2013-10-17 DIAGNOSIS — Z6841 Body Mass Index (BMI) 40.0 and over, adult: Secondary | ICD-10-CM | POA: Diagnosis not present

## 2013-10-17 DIAGNOSIS — M549 Dorsalgia, unspecified: Secondary | ICD-10-CM | POA: Diagnosis not present

## 2013-10-20 ENCOUNTER — Other Ambulatory Visit: Payer: Self-pay | Admitting: Neurological Surgery

## 2013-10-20 DIAGNOSIS — M545 Low back pain, unspecified: Secondary | ICD-10-CM

## 2013-10-21 ENCOUNTER — Ambulatory Visit (HOSPITAL_BASED_OUTPATIENT_CLINIC_OR_DEPARTMENT_OTHER): Payer: Medicare Other | Attending: Pulmonary Disease | Admitting: Radiology

## 2013-10-21 VITALS — Ht 67.0 in | Wt 275.0 lb

## 2013-10-21 DIAGNOSIS — G4733 Obstructive sleep apnea (adult) (pediatric): Secondary | ICD-10-CM | POA: Diagnosis not present

## 2013-10-21 DIAGNOSIS — I4729 Other ventricular tachycardia: Secondary | ICD-10-CM | POA: Insufficient documentation

## 2013-10-21 DIAGNOSIS — I472 Ventricular tachycardia, unspecified: Secondary | ICD-10-CM | POA: Insufficient documentation

## 2013-10-21 DIAGNOSIS — R0989 Other specified symptoms and signs involving the circulatory and respiratory systems: Secondary | ICD-10-CM | POA: Diagnosis present

## 2013-10-21 DIAGNOSIS — R0609 Other forms of dyspnea: Secondary | ICD-10-CM | POA: Diagnosis present

## 2013-10-21 DIAGNOSIS — G4710 Hypersomnia, unspecified: Secondary | ICD-10-CM | POA: Diagnosis present

## 2013-10-22 ENCOUNTER — Telehealth: Payer: Self-pay | Admitting: Pulmonary Disease

## 2013-10-22 DIAGNOSIS — G4733 Obstructive sleep apnea (adult) (pediatric): Secondary | ICD-10-CM

## 2013-10-22 NOTE — Telephone Encounter (Signed)
PSG 10/21/13 >> AHI 40.1, SaO2 80%, PLMI 75.6, 7 beat VT   Will have my nurse inform pt that sleep study confirms that he has severe sleep apnea.  He will need full night in lab titration study to determine if he needs BiPAP set up again or if he needs to change to CPAP.  This additional testing will be required for insurance coverage of sleep apnea therapy supplies.  Please order CPAP titration study if patient is agreeable to plan.

## 2013-10-22 NOTE — Sleep Study (Signed)
[place]  NAME: Carlos Dawson DATE OF BIRTH:  [birth year] MEDICAL RECORD NUMBER [id number]  LOCATION: [facility]  PHYSICIAN: Chesley Mires, M.D. DATE OF STUDY: 10/21/2013  SLEEP STUDY TYPE: Nocturnal polysomnogram               REFERRING PHYSICIAN: Chesley Mires, MD  INDICATION FOR STUDY:  Carlos Dawson is a 73 y.o. male who presents to the sleep lab for evaluation of hypersomnia with obstructive sleep apnea.  He reports snoring, sleep disruption, apnea, and daytime sleepiness.  He has history of obstructive sleep apnea (sleep study from 03/30/97 with AHI 21).  He had been on BiPAP therapy for more than five years.  He was informed by insurance provider and DME that he would need new sleep study prior to qualifying for new CPAP or BiPAP machine.  EPWORTH SLEEPINESS SCORE: 12. HEIGHT: 5\' 7"  (170.2 cm)  WEIGHT: 275 lb (124.739 kg)    Body mass index is 43.06 kg/(m^2).  NECK SIZE: 19 in.  MEDICATIONS:  Current Outpatient Prescriptions on File Prior to Visit  Medication Sig Dispense Refill  . acetaminophen (TYLENOL) 325 MG tablet Take 650 mg by mouth every 6 (six) hours as needed.      Marland Kitchen albuterol (PROVENTIL HFA;VENTOLIN HFA) 108 (90 BASE) MCG/ACT inhaler Inhale 2 puffs into the lungs every 6 (six) hours as needed for wheezing.      Marland Kitchen atenolol (TENORMIN) 25 MG tablet Take 25 mg by mouth daily.      Marland Kitchen atorvastatin (LIPITOR) 20 MG tablet Take 1 tablet (20 mg total) by mouth daily.  90 tablet  3  . cetirizine (ZYRTEC) 10 MG tablet Take 10 mg by mouth daily as needed for allergies.      . Cholecalciferol (VITAMIN D-3) 5000 UNITS TABS Take 5,000 Units by mouth daily.      . finasteride (PROSCAR) 5 MG tablet Take 5 mg by mouth daily.      . fluticasone (FLONASE) 50 MCG/ACT nasal spray Place 2 sprays into the nose daily as needed for rhinitis.      . furosemide (LASIX) 80 MG tablet Take 80 mg by mouth 2 (two) times daily.      Marland Kitchen  HYDROcodone-homatropine (HYCODAN) 5-1.5 MG/5ML syrup 1-2 teaspoons every 6 hours as needed. For cough and congestion.  240 mL  0  . hydrOXYzine (VISTARIL) 25 MG capsule Take 25 mg by mouth as needed for itching. As needed      . KLOR-CON M20 20 MEQ tablet TAKE 2 TABLETS BY MOUTH TWICE DAILY  120 tablet  6  . montelukast (SINGULAIR) 10 MG tablet Take 10 mg by mouth at bedtime.      . nitroGLYCERIN (NITROSTAT) 0.4 MG SL tablet Place 0.4 mg under the tongue every 5 (five) minutes as needed for chest pain.      . pantoprazole (PROTONIX) 40 MG tablet Take 40 mg by mouth daily.      . ranolazine (RANEXA) 500 MG 12 hr tablet Take 1 tablet (500 mg total) by mouth 2 (two) times daily.  180 tablet  3  . rivaroxaban (XARELTO) 20 MG TABS tablet TAKE 1 TABLET BY MOUTH EVERY DAY  90 tablet  1  . spironolactone (ALDACTONE) 25 MG tablet Take 1 tab daily      . tamsulosin (FLOMAX) 0.4 MG CAPS Take 0.4 mg by mouth daily.      . traMADol (ULTRAM) 50 MG tablet Take 50 mg by mouth  every 6 (six) hours as needed for pain.       No current facility-administered medications on file prior to visit.    SLEEP ARCHITECTURE:  Total recording time: 388 minutes.  Total sleep time was: 250 minutes.  Sleep efficiency: 64.4%.  Sleep latency: 21 minutes.  REM latency: 277.5 minutes.  Stage N1: 36%.  Stage N2: 52.4%.  Stage N3: 0%.  Stage R:  11.6%.  Supine sleep: 0 minutes.  Non-supine sleep: 250 minutes.  CARDIAC DATA:  Average heart rate: 67 beats per minute. Rhythm strip: sinus rhythm with PVC's and PAC's.  He also had 7 beat run of ventricular tachycardia in Epoch 385.  RESPIRATORY DATA: Average respiratory rate: 18. Snoring: loud. Average AHI: 40.1.   Apnea index: 4.6.  Hypopnea index: 35.5. Obstructive apnea index: 4.6.  Central apnea index: 0.  Mixed apnea index: 0. REM AHI: 35.2.  NREM AHI: 40.7. Supine AHI: N/A. Non-supine AHI: 40.1.  MOVEMENT/PARASOMNIA:  Periodic limb movement: 75.6.  Period limb movements  with arousals: 6.5. Restroom trips: none.  OXYGEN DATA:  Baseline oxygenation: 98%. Lowest SaO2: 80%. Time spent below SaO2 90%: 19.8 minutes. Supplemental oxygen used: none.  IMPRESSION/ RECOMMENDATION:   This study shows severe obstructive sleep apnea with an AHI of 40.1 and SaO2 low of 80%.  He will need in lab titration study starting with CPAP, and then transition to BiPAP +/- supplemental oxygen as needed.  He had increased periodic limb movement index.  Clinical correlation needed to determine significance of this.  He had short run of ventricular tachycardia noted in Epoch 385.  He is followed in cardiology clinic.  Chesley Mires, M.D. Diplomate, Tax adviser of Sleep Medicine  ELECTRONICALLY SIGNED ON:  10/22/2013, 10:13 AM [place] PH: [phone]   FX: [phone] [place]

## 2013-10-23 ENCOUNTER — Ambulatory Visit
Admission: RE | Admit: 2013-10-23 | Discharge: 2013-10-23 | Disposition: A | Discharge status: Home / Self Care | Payer: Medicare Other | Source: Ambulatory Visit | Attending: Neurological Surgery | Admitting: Neurological Surgery

## 2013-10-23 DIAGNOSIS — M545 Low back pain, unspecified: Secondary | ICD-10-CM

## 2013-10-23 MED ORDER — IOHEXOL 180 MG/ML  SOLN
1.0000 mL | Freq: Once | INTRAMUSCULAR | Status: AC | PRN
  Administered 2013-10-23: 1 mL via INTRA_ARTICULAR

## 2013-10-23 MED ORDER — METHYLPREDNISOLONE ACETATE 40 MG/ML INJ SUSP (RADIOLOG
120.0000 mg | Freq: Once | INTRAMUSCULAR | Status: AC
  Administered 2013-10-23: 120 mg via INTRA_ARTICULAR

## 2013-10-23 NOTE — Telephone Encounter (Signed)
Results have been explained to patient, pt expressed understanding.  Order placed for CPAP titration study. Nothing further needed.  

## 2013-10-23 NOTE — Telephone Encounter (Signed)
[email or web address] x1 

## 2013-10-23 NOTE — Addendum Note (Signed)
Addended by: Virl Cagey on: 10/23/2013 05:03 PM   Modules accepted: Orders

## 2013-10-23 NOTE — Telephone Encounter (Signed)
Pt returning call pt can be reached @ [phone].Carlos Dawson

## 2013-11-16 ENCOUNTER — Encounter (HOSPITAL_BASED_OUTPATIENT_CLINIC_OR_DEPARTMENT_OTHER): Payer: Medicare Other

## 2013-11-19 ENCOUNTER — Other Ambulatory Visit: Payer: Self-pay | Admitting: Neurological Surgery

## 2013-11-19 DIAGNOSIS — M545 Low back pain: Secondary | ICD-10-CM

## 2013-11-24 ENCOUNTER — Ambulatory Visit
Admission: RE | Admit: 2013-11-24 | Discharge: 2013-11-24 | Disposition: A | Discharge status: Home / Self Care | Payer: Medicare Other | Source: Ambulatory Visit | Attending: Neurological Surgery | Admitting: Neurological Surgery

## 2013-11-24 ENCOUNTER — Telehealth: Payer: Self-pay | Admitting: Cardiology

## 2013-11-24 ENCOUNTER — Other Ambulatory Visit: Payer: Self-pay | Admitting: Neurological Surgery

## 2013-11-24 DIAGNOSIS — N528 Other male erectile dysfunction: Secondary | ICD-10-CM | POA: Diagnosis not present

## 2013-11-24 DIAGNOSIS — R972 Elevated prostate specific antigen [PSA]: Secondary | ICD-10-CM | POA: Diagnosis not present

## 2013-11-24 DIAGNOSIS — N401 Enlarged prostate with lower urinary tract symptoms: Secondary | ICD-10-CM | POA: Diagnosis not present

## 2013-11-24 DIAGNOSIS — M545 Low back pain: Secondary | ICD-10-CM

## 2013-11-24 MED ORDER — VALSARTAN 80 MG PO TABS: ORAL_TABLET | ORAL | Status: DC

## 2013-11-24 MED ORDER — IOHEXOL 180 MG/ML  SOLN
1.0000 mL | Freq: Once | INTRAMUSCULAR | Status: AC | PRN
  Administered 2013-11-24: 1 mL via INTRA_ARTICULAR

## 2013-11-24 MED ORDER — METHYLPREDNISOLONE ACETATE 40 MG/ML INJ SUSP (RADIOLOG
120.0000 mg | Freq: Once | INTRAMUSCULAR | Status: AC
  Administered 2013-11-24: 120 mg via INTRA_ARTICULAR

## 2013-11-24 NOTE — Telephone Encounter (Signed)
New message          Pt bp is rising / should pt go back on bp medication he was on before?

## 2013-11-24 NOTE — Telephone Encounter (Signed)
Returned call to patient he stated his B/P has been elevated today 147/90.B/P ranging 130 to 140/80,pulse in 60's bpm.Stated last time he saw Dr.Jordan B/P too low and diovan was stopped.Stated he has a thoracic aneurysm and Dr.Jordan told him need to keep B/P down.Dr.Jordan out of office this week,spoke to DOD Dr.Harding he advised to take diovan 40 mg daily,continue all other medications.Advised to continue to monitor B/P and call back if continues to be elevated.

## 2013-12-04 ENCOUNTER — Other Ambulatory Visit: Payer: Self-pay | Admitting: Internal Medicine

## 2013-12-04 DIAGNOSIS — N644 Mastodynia: Secondary | ICD-10-CM | POA: Diagnosis not present

## 2013-12-04 DIAGNOSIS — Z23 Encounter for immunization: Secondary | ICD-10-CM | POA: Diagnosis not present

## 2013-12-22 ENCOUNTER — Ambulatory Visit
Admission: RE | Admit: 2013-12-22 | Discharge: 2013-12-22 | Disposition: A | Discharge status: Home / Self Care | Payer: Medicare Other | Source: Ambulatory Visit | Attending: Internal Medicine | Admitting: Internal Medicine

## 2013-12-22 ENCOUNTER — Other Ambulatory Visit: Payer: Self-pay | Admitting: Internal Medicine

## 2013-12-22 DIAGNOSIS — N62 Hypertrophy of breast: Secondary | ICD-10-CM | POA: Diagnosis not present

## 2013-12-22 DIAGNOSIS — Z6841 Body Mass Index (BMI) 40.0 and over, adult: Secondary | ICD-10-CM | POA: Diagnosis not present

## 2013-12-22 DIAGNOSIS — N644 Mastodynia: Secondary | ICD-10-CM

## 2013-12-22 DIAGNOSIS — M545 Low back pain: Secondary | ICD-10-CM | POA: Diagnosis not present

## 2013-12-23 DIAGNOSIS — M5416 Radiculopathy, lumbar region: Secondary | ICD-10-CM | POA: Diagnosis not present

## 2013-12-23 DIAGNOSIS — M545 Low back pain: Secondary | ICD-10-CM | POA: Diagnosis not present

## 2013-12-23 DIAGNOSIS — M2560 Stiffness of unspecified joint, not elsewhere classified: Secondary | ICD-10-CM | POA: Diagnosis not present

## 2013-12-23 DIAGNOSIS — R293 Abnormal posture: Secondary | ICD-10-CM | POA: Diagnosis not present

## 2013-12-24 DIAGNOSIS — M2560 Stiffness of unspecified joint, not elsewhere classified: Secondary | ICD-10-CM | POA: Diagnosis not present

## 2013-12-24 DIAGNOSIS — R293 Abnormal posture: Secondary | ICD-10-CM | POA: Diagnosis not present

## 2013-12-24 DIAGNOSIS — M5416 Radiculopathy, lumbar region: Secondary | ICD-10-CM | POA: Diagnosis not present

## 2013-12-24 DIAGNOSIS — M545 Low back pain: Secondary | ICD-10-CM | POA: Diagnosis not present

## 2013-12-28 ENCOUNTER — Ambulatory Visit (HOSPITAL_BASED_OUTPATIENT_CLINIC_OR_DEPARTMENT_OTHER): Payer: Medicare Other | Attending: Pulmonary Disease

## 2013-12-28 DIAGNOSIS — G4730 Sleep apnea, unspecified: Secondary | ICD-10-CM | POA: Diagnosis not present

## 2013-12-28 DIAGNOSIS — Z7901 Long term (current) use of anticoagulants: Secondary | ICD-10-CM | POA: Diagnosis not present

## 2013-12-28 DIAGNOSIS — Z6841 Body Mass Index (BMI) 40.0 and over, adult: Secondary | ICD-10-CM | POA: Diagnosis not present

## 2013-12-28 DIAGNOSIS — Z79899 Other long term (current) drug therapy: Secondary | ICD-10-CM | POA: Insufficient documentation

## 2013-12-28 DIAGNOSIS — G4733 Obstructive sleep apnea (adult) (pediatric): Secondary | ICD-10-CM

## 2014-01-01 ENCOUNTER — Telehealth: Payer: Self-pay | Admitting: Pulmonary Disease

## 2014-01-01 DIAGNOSIS — R0600 Dyspnea, unspecified: Secondary | ICD-10-CM

## 2014-01-01 DIAGNOSIS — G4733 Obstructive sleep apnea (adult) (pediatric): Secondary | ICD-10-CM | POA: Diagnosis not present

## 2014-01-01 NOTE — Telephone Encounter (Signed)
CPAP 12/28/13 >> CPAP 10 cm H2O >> AHI 1.5, +R   Will have my nurse inform pt that he did very well in CPAP study.  He did not need BiPAP or supplemental oxygen.  Please set him up with CPAP 10 cm H2O with heated humidity and mask of choice.  He will need ROV 2 months after CPAP set up.

## 2014-01-01 NOTE — Sleep Study (Signed)
[place]  NAME: BRAXTYN BOJARSKI DATE OF BIRTH:  1940-01-29 MEDICAL RECORD NUMBER [id number]  LOCATION: [facility]  PHYSICIAN: Chesley Mires, M.D. DATE OF STUDY: 12/28/2013  SLEEP STUDY TYPE: CPAP titration.               REFERRING PHYSICIAN: Chesley Mires, MD  INDICATION FOR STUDY:  Carlos Dawson is a 73 y.o. male with hx of severe sleep apnea.  He presents for a CPAP titration study.  EPWORTH SLEEPINESS SCORE: 12. HEIGHT: 5\' 7"  (170.2 cm)  WEIGHT: 274 lb (124.286 kg)    Body mass index is 42.9 kg/(m^2).  NECK SIZE: 19 in.  MEDICATIONS:  Current Outpatient Prescriptions on File Prior to Visit  Medication Sig Dispense Refill  . acetaminophen (TYLENOL) 325 MG tablet Take 650 mg by mouth every 6 (six) hours as needed.    Marland Kitchen albuterol (PROVENTIL HFA;VENTOLIN HFA) 108 (90 BASE) MCG/ACT inhaler Inhale 2 puffs into the lungs every 6 (six) hours as needed for wheezing.    Marland Kitchen atenolol (TENORMIN) 25 MG tablet Take 25 mg by mouth daily.    Marland Kitchen atorvastatin (LIPITOR) 20 MG tablet Take 1 tablet (20 mg total) by mouth daily. 90 tablet 3  . cetirizine (ZYRTEC) 10 MG tablet Take 10 mg by mouth daily as needed for allergies.    . Cholecalciferol (VITAMIN D-3) 5000 UNITS TABS Take 5,000 Units by mouth daily.    . finasteride (PROSCAR) 5 MG tablet Take 5 mg by mouth daily.    . fluticasone (FLONASE) 50 MCG/ACT nasal spray Place 2 sprays into the nose daily as needed for rhinitis.    . furosemide (LASIX) 80 MG tablet Take 80 mg by mouth 2 (two) times daily.    Marland Kitchen HYDROcodone-homatropine (HYCODAN) 5-1.5 MG/5ML syrup 1-2 teaspoons every 6 hours as needed. For cough and congestion. 240 mL 0  . hydrOXYzine (VISTARIL) 25 MG capsule Take 25 mg by mouth as needed for itching. As needed    . KLOR-CON M20 20 MEQ tablet TAKE 2 TABLETS BY MOUTH TWICE DAILY 120 tablet 6  . montelukast (SINGULAIR) 10 MG tablet Take 10 mg by mouth at bedtime.    . nitroGLYCERIN (NITROSTAT)  0.4 MG SL tablet Place 0.4 mg under the tongue every 5 (five) minutes as needed for chest pain.    . pantoprazole (PROTONIX) 40 MG tablet Take 40 mg by mouth daily.    . ranolazine (RANEXA) 500 MG 12 hr tablet Take 1 tablet (500 mg total) by mouth 2 (two) times daily. 180 tablet 3  . rivaroxaban (XARELTO) 20 MG TABS tablet TAKE 1 TABLET BY MOUTH EVERY DAY 90 tablet 1  . spironolactone (ALDACTONE) 25 MG tablet Take 1 tab daily    . tamsulosin (FLOMAX) 0.4 MG CAPS Take 0.4 mg by mouth daily.    . traMADol (ULTRAM) 50 MG tablet Take 50 mg by mouth every 6 (six) hours as needed for pain.    . valsartan (DIOVAN) 80 MG tablet Take 1/2 tablet daily 30 tablet 6   No current facility-administered medications on file prior to visit.    SLEEP ARCHITECTURE:  Total recording time: 366 minutes.  Total sleep time was: 303.5 minutes.  Sleep efficiency: 82.9%.  Sleep latency: 22 minutes.  REM latency: 256 minutes.  Stage N1: 10.7%.  Stage N2: 83.4%.  Stage N3: 0%.  Stage R:  5.9%.  Supine sleep: 0 minutes.  Non-supine sleep: 303.5 minutes.  CARDIAC DATA:  Average heart  rate: 64 beats per minute. Rhythm strip: sinus rhythm with PVCs.  RESPIRATORY DATA: Average respiratory rate: 14.  He was started on CPAP 5 and increased to 12 cm H2O.  With CPAP at 10 cm H2O his AHI was 1.5.  At this pressure he was observed in REM sleep.  MOVEMENT/PARASOMNIA:  Periodic limb movement: 147.5.  Period limb movements with arousals: 14.4. Restroom trips: none.  OXYGEN DATA:  Baseline oxygenation: 97%. Lowest SaO2: 89%. Time spent below SaO2 90%: 0.6 minutes. Supplemental oxygen used: none.  IMPRESSION/ RECOMMENDATION:   This was a successful titration study.  He did well with CPAP 10 cm H2O.  He was fitted with a Fisher Paykel medium size Simplus full face mask.  Additional therapies include weight loss, CPAP, oral appliance, or surgical evaluation.   Chesley Mires, M.D. Diplomate, Tax adviser of Sleep  Medicine  ELECTRONICALLY SIGNED ON:  01/01/2014, 4:13 PM [place] PH: [phone]   FX: [phone] [place]

## 2014-01-05 ENCOUNTER — Encounter (HOSPITAL_BASED_OUTPATIENT_CLINIC_OR_DEPARTMENT_OTHER): Payer: Medicare Other

## 2014-01-08 NOTE — Telephone Encounter (Signed)
lmtcb x1 

## 2014-01-09 NOTE — Telephone Encounter (Signed)
Per Janett Billow, pt aware of results. Pt aware that he can stop O2 once he receives CPAP and that order or O2 D/C will be sent. ROV recall entered.  Nothing further needed.

## 2014-01-12 DIAGNOSIS — M545 Low back pain: Secondary | ICD-10-CM | POA: Diagnosis not present

## 2014-01-12 DIAGNOSIS — R293 Abnormal posture: Secondary | ICD-10-CM | POA: Diagnosis not present

## 2014-01-12 DIAGNOSIS — M2560 Stiffness of unspecified joint, not elsewhere classified: Secondary | ICD-10-CM | POA: Diagnosis not present

## 2014-01-12 DIAGNOSIS — M5416 Radiculopathy, lumbar region: Secondary | ICD-10-CM | POA: Diagnosis not present

## 2014-01-13 DIAGNOSIS — M545 Low back pain: Secondary | ICD-10-CM | POA: Diagnosis not present

## 2014-01-13 DIAGNOSIS — R293 Abnormal posture: Secondary | ICD-10-CM | POA: Diagnosis not present

## 2014-01-13 DIAGNOSIS — M5416 Radiculopathy, lumbar region: Secondary | ICD-10-CM | POA: Diagnosis not present

## 2014-01-13 DIAGNOSIS — M2560 Stiffness of unspecified joint, not elsewhere classified: Secondary | ICD-10-CM | POA: Diagnosis not present

## 2014-01-22 ENCOUNTER — Ambulatory Visit: Payer: Medicare Other | Admitting: Pulmonary Disease

## 2014-01-26 DIAGNOSIS — M545 Low back pain: Secondary | ICD-10-CM | POA: Diagnosis not present

## 2014-01-26 DIAGNOSIS — M2560 Stiffness of unspecified joint, not elsewhere classified: Secondary | ICD-10-CM | POA: Diagnosis not present

## 2014-01-26 DIAGNOSIS — R293 Abnormal posture: Secondary | ICD-10-CM | POA: Diagnosis not present

## 2014-01-26 DIAGNOSIS — M5416 Radiculopathy, lumbar region: Secondary | ICD-10-CM | POA: Diagnosis not present

## 2014-01-27 DIAGNOSIS — R293 Abnormal posture: Secondary | ICD-10-CM | POA: Diagnosis not present

## 2014-01-27 DIAGNOSIS — M5416 Radiculopathy, lumbar region: Secondary | ICD-10-CM | POA: Diagnosis not present

## 2014-01-27 DIAGNOSIS — M2560 Stiffness of unspecified joint, not elsewhere classified: Secondary | ICD-10-CM | POA: Diagnosis not present

## 2014-01-27 DIAGNOSIS — M545 Low back pain: Secondary | ICD-10-CM | POA: Diagnosis not present

## 2014-01-29 DIAGNOSIS — M2560 Stiffness of unspecified joint, not elsewhere classified: Secondary | ICD-10-CM | POA: Diagnosis not present

## 2014-01-29 DIAGNOSIS — M545 Low back pain: Secondary | ICD-10-CM | POA: Diagnosis not present

## 2014-01-29 DIAGNOSIS — M5416 Radiculopathy, lumbar region: Secondary | ICD-10-CM | POA: Diagnosis not present

## 2014-01-29 DIAGNOSIS — R293 Abnormal posture: Secondary | ICD-10-CM | POA: Diagnosis not present

## 2014-02-02 DIAGNOSIS — M2560 Stiffness of unspecified joint, not elsewhere classified: Secondary | ICD-10-CM | POA: Diagnosis not present

## 2014-02-02 DIAGNOSIS — M545 Low back pain: Secondary | ICD-10-CM | POA: Diagnosis not present

## 2014-02-02 DIAGNOSIS — M5416 Radiculopathy, lumbar region: Secondary | ICD-10-CM | POA: Diagnosis not present

## 2014-02-02 DIAGNOSIS — R293 Abnormal posture: Secondary | ICD-10-CM | POA: Diagnosis not present

## 2014-02-03 DIAGNOSIS — M5416 Radiculopathy, lumbar region: Secondary | ICD-10-CM | POA: Diagnosis not present

## 2014-02-03 DIAGNOSIS — R293 Abnormal posture: Secondary | ICD-10-CM | POA: Diagnosis not present

## 2014-02-03 DIAGNOSIS — M545 Low back pain: Secondary | ICD-10-CM | POA: Diagnosis not present

## 2014-02-03 DIAGNOSIS — M2560 Stiffness of unspecified joint, not elsewhere classified: Secondary | ICD-10-CM | POA: Diagnosis not present

## 2014-02-05 DIAGNOSIS — M545 Low back pain: Secondary | ICD-10-CM | POA: Diagnosis not present

## 2014-02-05 DIAGNOSIS — M5416 Radiculopathy, lumbar region: Secondary | ICD-10-CM | POA: Diagnosis not present

## 2014-02-05 DIAGNOSIS — M2560 Stiffness of unspecified joint, not elsewhere classified: Secondary | ICD-10-CM | POA: Diagnosis not present

## 2014-02-05 DIAGNOSIS — R293 Abnormal posture: Secondary | ICD-10-CM | POA: Diagnosis not present

## 2014-02-09 DIAGNOSIS — M545 Low back pain: Secondary | ICD-10-CM | POA: Diagnosis not present

## 2014-02-09 DIAGNOSIS — M5416 Radiculopathy, lumbar region: Secondary | ICD-10-CM | POA: Diagnosis not present

## 2014-02-09 DIAGNOSIS — R293 Abnormal posture: Secondary | ICD-10-CM | POA: Diagnosis not present

## 2014-02-09 DIAGNOSIS — M2560 Stiffness of unspecified joint, not elsewhere classified: Secondary | ICD-10-CM | POA: Diagnosis not present

## 2014-02-10 DIAGNOSIS — R293 Abnormal posture: Secondary | ICD-10-CM | POA: Diagnosis not present

## 2014-02-10 DIAGNOSIS — M2560 Stiffness of unspecified joint, not elsewhere classified: Secondary | ICD-10-CM | POA: Diagnosis not present

## 2014-02-10 DIAGNOSIS — M5416 Radiculopathy, lumbar region: Secondary | ICD-10-CM | POA: Diagnosis not present

## 2014-02-10 DIAGNOSIS — M545 Low back pain: Secondary | ICD-10-CM | POA: Diagnosis not present

## 2014-02-16 DIAGNOSIS — R293 Abnormal posture: Secondary | ICD-10-CM | POA: Diagnosis not present

## 2014-02-16 DIAGNOSIS — M2560 Stiffness of unspecified joint, not elsewhere classified: Secondary | ICD-10-CM | POA: Diagnosis not present

## 2014-02-16 DIAGNOSIS — M5416 Radiculopathy, lumbar region: Secondary | ICD-10-CM | POA: Diagnosis not present

## 2014-02-16 DIAGNOSIS — M545 Low back pain: Secondary | ICD-10-CM | POA: Diagnosis not present

## 2014-02-17 DIAGNOSIS — R293 Abnormal posture: Secondary | ICD-10-CM | POA: Diagnosis not present

## 2014-02-17 DIAGNOSIS — M5416 Radiculopathy, lumbar region: Secondary | ICD-10-CM | POA: Diagnosis not present

## 2014-02-17 DIAGNOSIS — M545 Low back pain: Secondary | ICD-10-CM | POA: Diagnosis not present

## 2014-02-17 DIAGNOSIS — M2560 Stiffness of unspecified joint, not elsewhere classified: Secondary | ICD-10-CM | POA: Diagnosis not present

## 2014-02-23 DIAGNOSIS — M5416 Radiculopathy, lumbar region: Secondary | ICD-10-CM | POA: Diagnosis not present

## 2014-02-23 DIAGNOSIS — M545 Low back pain: Secondary | ICD-10-CM | POA: Diagnosis not present

## 2014-02-23 DIAGNOSIS — M2560 Stiffness of unspecified joint, not elsewhere classified: Secondary | ICD-10-CM | POA: Diagnosis not present

## 2014-02-23 DIAGNOSIS — R293 Abnormal posture: Secondary | ICD-10-CM | POA: Diagnosis not present

## 2014-02-25 DIAGNOSIS — R293 Abnormal posture: Secondary | ICD-10-CM | POA: Diagnosis not present

## 2014-02-25 DIAGNOSIS — M545 Low back pain: Secondary | ICD-10-CM | POA: Diagnosis not present

## 2014-02-25 DIAGNOSIS — M2560 Stiffness of unspecified joint, not elsewhere classified: Secondary | ICD-10-CM | POA: Diagnosis not present

## 2014-02-25 DIAGNOSIS — M5416 Radiculopathy, lumbar region: Secondary | ICD-10-CM | POA: Diagnosis not present

## 2014-03-01 ENCOUNTER — Other Ambulatory Visit: Payer: Self-pay

## 2014-03-01 MED ORDER — POTASSIUM CHLORIDE CRYS ER 20 MEQ PO TBCR: 40.0000 meq | EXTENDED_RELEASE_TABLET | Freq: Two times a day (BID) | ORAL | Status: DC

## 2014-03-02 DIAGNOSIS — R293 Abnormal posture: Secondary | ICD-10-CM | POA: Diagnosis not present

## 2014-03-02 DIAGNOSIS — M5416 Radiculopathy, lumbar region: Secondary | ICD-10-CM | POA: Diagnosis not present

## 2014-03-02 DIAGNOSIS — M2560 Stiffness of unspecified joint, not elsewhere classified: Secondary | ICD-10-CM | POA: Diagnosis not present

## 2014-03-02 DIAGNOSIS — M545 Low back pain: Secondary | ICD-10-CM | POA: Diagnosis not present

## 2014-03-04 ENCOUNTER — Encounter: Payer: Self-pay | Admitting: Pulmonary Disease

## 2014-03-04 ENCOUNTER — Ambulatory Visit (INDEPENDENT_AMBULATORY_CARE_PROVIDER_SITE_OTHER): Payer: Medicare Other | Admitting: Pulmonary Disease

## 2014-03-04 VITALS — BP 116/60 | HR 66 | Ht 67.0 in | Wt 283.2 lb

## 2014-03-04 DIAGNOSIS — G4733 Obstructive sleep apnea (adult) (pediatric): Secondary | ICD-10-CM

## 2014-03-04 DIAGNOSIS — M545 Low back pain: Secondary | ICD-10-CM | POA: Diagnosis not present

## 2014-03-04 DIAGNOSIS — J309 Allergic rhinitis, unspecified: Secondary | ICD-10-CM | POA: Diagnosis not present

## 2014-03-04 DIAGNOSIS — R0600 Dyspnea, unspecified: Secondary | ICD-10-CM | POA: Diagnosis not present

## 2014-03-04 DIAGNOSIS — J4530 Mild persistent asthma, uncomplicated: Secondary | ICD-10-CM | POA: Diagnosis not present

## 2014-03-04 DIAGNOSIS — M5416 Radiculopathy, lumbar region: Secondary | ICD-10-CM | POA: Diagnosis not present

## 2014-03-04 DIAGNOSIS — R293 Abnormal posture: Secondary | ICD-10-CM | POA: Diagnosis not present

## 2014-03-04 DIAGNOSIS — M2560 Stiffness of unspecified joint, not elsewhere classified: Secondary | ICD-10-CM | POA: Diagnosis not present

## 2014-03-04 NOTE — Patient Instructions (Signed)
Talk to your home care company about CPAP mask fit  Can look up following companies for different CPAP masks: [employer], [employer], [place]  Call to let us know which inhaler you got from the [place]  Follow up in 6 months

## 2014-03-04 NOTE — Progress Notes (Signed)
Chief Complaint  Patient presents with  . Follow-up    Wears CPAP nightly. Denies problems with mask. Pt feels pressure setting might be too low.     History of Present Illness: Carlos Dawson is a 74 y.o. male former smoker with dyspnea, OSA on BPAP 12/8, and asthma.  He had titration study done in December.  He was started on CPAP.  He has been doing well with CPAP, especially after he had his mask adjusted.  He is sleeping better, and feels more energy during the day.  He gets his inhalers from the [place].  He does not recall which inhaler he has.  He has not been using Qvar.  He has been using singulair.  He feels his breathing is okay overall, but does get trouble with cough and wheeze occasionally.  He has noticed feeling more full and like food is getting stuck in his esophagus.  He will make appointment with surgery to assess his gastric bypass.  Tests: PSG 03/30/97 >> AHI 21 ONO with BPAP and room air 12/12/10 >> Test time 6 hrs 56 min.  Basal SpO2 93.1%, low SpO2 86%.  Spent 2.8 min with SpO2 < 88%. PFT 12/20/10 >> FEV1 2.13(79%), FEV1% 71, TLC 5.80(100%), DLCO 57%, no BD CT chest 09/14/11 >> Stable dilatation of ascending thoracic aorta 4.7 cm, PA 4.1 cm PSG 10/21/13 >> AHI 40.1, SaO2 80%, PLMI 75.6, 7 beat VT CPAP 12/28/13 >> CPAP 10 cm H2O >> AHI 1.5, +R  CPAP 02/02/14 to 03/03/14 >> used on 28 of 30 nights with average 7 hrs and 30 min.  Average AHI is 2.1 with CPAP 10 cm H2O.  PMHx >> HTN, diastolic dysfx, CAD s/p stent, PAF, HLD, TAA, PE in 2008, HA, GERD, IBS, Obesity s/p gastric bypass in 2009  PSHx, Medications, Allergies, Fhx, Shx reviewed.  Physical Exam: Blood pressure 116/60, pulse 66, height 5\' 7"  (1.702 m), weight 283 lb 3.2 oz (128.459 kg), SpO2 97 %. Body mass index is 44.35 kg/(m^2).  General - Obese  HEENT - No sinus tenderness, no oral exudate, no LAN  Cardiac - s1s2 regular, no murmur  Chest - normal respiratory excursion, no wheeze/rales/dullness  Abdomen  - obese, soft  Extremities - +1 ankle edema  Skin - no rashes  Neurologic - normal strength  Psychiatric - normal mood, behavior   Assessment/Plan:  Obstructive sleep apnea. He is compliant with therapy and reports benefit from CPAP. Plan: - continue CPAP 10 cm H2O  Mild, persistent asthma. Plan: - he is to check which inhaler he is getting from the [place] and call office back - he might benefit from restarting maintenance inhaler therapy >> explained how asthma control can fluctuate, and as such his medical regimen can change also - continue singulair  Allergic rhinitis. Stable. Plan: - continue flonase, singulair, and claritin  Obesity s/p lap band surgery. Plan: - he is to f/u with surgery to assess whether he needs his lap band adjusted   Chesley Mires, MD [place] 03/04/2014, 2:59 PM Pager:  [phone] After 3pm call: [phone]

## 2014-03-05 ENCOUNTER — Telehealth: Payer: Self-pay | Admitting: Pulmonary Disease

## 2014-03-05 NOTE — Telephone Encounter (Signed)
Spoke with the pt  He states that the name of the inhaler he has from the [place] is combivent respimat  Please advise if you want him to start taking this, thanks

## 2014-03-05 NOTE — Telephone Encounter (Signed)
ATC pt, phone line did not ring. Tried calling again and same. WCB

## 2014-03-06 ENCOUNTER — Other Ambulatory Visit: Payer: Self-pay | Admitting: Pulmonary Disease

## 2014-03-06 ENCOUNTER — Telehealth: Payer: Self-pay | Admitting: Pulmonary Disease

## 2014-03-06 MED ORDER — BECLOMETHASONE DIPROPIONATE 80 MCG/ACT IN AERS: 2.0000 | INHALATION_SPRAY | Freq: Two times a day (BID) | RESPIRATORY_TRACT | Status: DC

## 2014-03-06 NOTE — Telephone Encounter (Signed)
Called and spoke to pt. Pt is requesting rx of QVAR sent to preferred pharmacy. Per phone note from 03/05/14 VS wanted to restart Qvar if symptoms are not being controlled. Rx sent to preferred pharmacy. Pt verbalized understanding and denied any further questions or concerns at this time.

## 2014-03-06 NOTE — Telephone Encounter (Signed)
Called spoke with pt. He reports he called his insurance and was told no alternative to QVAR but this is not covered under insurance. Pt wants a diff inhaler called in. Please advise VS thanks

## 2014-03-06 NOTE — Telephone Encounter (Signed)
Please explain to him that combivent is a rescue inhaler.  If he is using this more than 3 times per week, then he need would need to be on maintenance inhaler like Qvar.  He can call back to get script for Qvar if he feels like his symptoms are not controlled with intermittent use of combivent.  If he feels like he would want to restart Qvar, then send order for Qvar 80 mcg 2 puffs bid, with 5 refills.

## 2014-03-06 NOTE — Telephone Encounter (Signed)
Spoke with pt regarding below.  He verbalized understanding of this.  Pt feels symptoms are controlled now and has not needed to use the combivent respimat.  He would like to call office back for qvar rx if symptoms become uncontrollable with intermittent use of combivent or if he is needing to use it > 3 x per wk.  Pt verbalized understanding of discussion, is in agreement with plan, and voiced no further questions or concerns at this time.

## 2014-03-09 MED ORDER — FLUTICASONE PROPIONATE HFA 110 MCG/ACT IN AERO: 2.0000 | INHALATION_SPRAY | Freq: Two times a day (BID) | RESPIRATORY_TRACT | Status: DC

## 2014-03-09 NOTE — Telephone Encounter (Signed)
Spoke with patient-he is aware of change with inhalers and sent to [facility]. I have updated EPIC med list as well. Nothing more needed at this time.

## 2014-03-09 NOTE — Telephone Encounter (Signed)
Call in flovent 110 mcg two puffs bid.  Give one inhaler with 5 refills.

## 2014-03-17 DIAGNOSIS — R51 Headache: Secondary | ICD-10-CM | POA: Diagnosis not present

## 2014-03-17 DIAGNOSIS — R1032 Left lower quadrant pain: Secondary | ICD-10-CM | POA: Diagnosis not present

## 2014-03-24 ENCOUNTER — Ambulatory Visit: Payer: Self-pay | Admitting: Cardiology

## 2014-03-24 DIAGNOSIS — Z6841 Body Mass Index (BMI) 40.0 and over, adult: Secondary | ICD-10-CM | POA: Diagnosis not present

## 2014-03-24 DIAGNOSIS — Z5181 Encounter for therapeutic drug level monitoring: Secondary | ICD-10-CM

## 2014-03-24 DIAGNOSIS — I2699 Other pulmonary embolism without acute cor pulmonale: Secondary | ICD-10-CM

## 2014-03-24 DIAGNOSIS — M4804 Spinal stenosis, thoracic region: Secondary | ICD-10-CM | POA: Diagnosis not present

## 2014-03-24 DIAGNOSIS — M545 Low back pain: Secondary | ICD-10-CM | POA: Diagnosis not present

## 2014-04-01 ENCOUNTER — Other Ambulatory Visit: Payer: Self-pay | Admitting: Internal Medicine

## 2014-04-01 ENCOUNTER — Ambulatory Visit
Admission: RE | Admit: 2014-04-01 | Discharge: 2014-04-01 | Disposition: A | Discharge status: Home / Self Care | Payer: Medicare Other | Source: Ambulatory Visit | Attending: Internal Medicine | Admitting: Internal Medicine

## 2014-04-01 DIAGNOSIS — R1032 Left lower quadrant pain: Secondary | ICD-10-CM | POA: Diagnosis not present

## 2014-04-01 DIAGNOSIS — R109 Unspecified abdominal pain: Secondary | ICD-10-CM

## 2014-04-08 ENCOUNTER — Telehealth: Payer: Self-pay

## 2014-04-08 NOTE — Telephone Encounter (Signed)
Received call from patient he stated Ranexa 500 mg causing headaches.Stated he stopped for 1 week and did not have headache.Restarted back and headaches are back.Spoke to [place] he advised to stop Ranexa.Advised to keep appointment with Dr.Jordan 04/17/14 at 2:00 pm.

## 2014-04-17 ENCOUNTER — Encounter: Payer: Self-pay | Admitting: Cardiology

## 2014-04-17 ENCOUNTER — Ambulatory Visit (INDEPENDENT_AMBULATORY_CARE_PROVIDER_SITE_OTHER): Payer: Medicare Other | Admitting: Cardiology

## 2014-04-17 VITALS — BP 100/54 | Ht 67.0 in | Wt 284.4 lb

## 2014-04-17 DIAGNOSIS — I712 Thoracic aortic aneurysm, without rupture, unspecified: Secondary | ICD-10-CM

## 2014-04-17 DIAGNOSIS — Z7901 Long term (current) use of anticoagulants: Secondary | ICD-10-CM

## 2014-04-17 DIAGNOSIS — I5032 Chronic diastolic (congestive) heart failure: Secondary | ICD-10-CM | POA: Diagnosis not present

## 2014-04-17 DIAGNOSIS — I480 Paroxysmal atrial fibrillation: Secondary | ICD-10-CM

## 2014-04-17 DIAGNOSIS — G4733 Obstructive sleep apnea (adult) (pediatric): Secondary | ICD-10-CM

## 2014-04-17 DIAGNOSIS — I2510 Atherosclerotic heart disease of native coronary artery without angina pectoris: Secondary | ICD-10-CM

## 2014-04-17 NOTE — Progress Notes (Signed)
Carlos Dawson Date of Birth: 03-Mar-1940 Medical Record #[id number]  History of Present Illness: Carlos Dawson is seen today for evaluation of dyspnea and chest pain. He has multiple medical issues including CAD, thoracic aneurysm, history of pulmonary emboli, morbid obesity with prior lap banding, HTN, chronic edema and PAF. He he is on chronic Xarelto. He underwent stenting of the obtuse marginal vessel in September of 2012. On follow up today he reports a 4 month history of increased dyspnea on exertion associated with symptoms of chest pressure. It feels like someone stepping on his chest. Pulse ox has been 96-98%. HR has been normal. When he gets SOB he feels like he hyperventilates and almost like he cant get any air. He just completed a coarse of steroids for back pain. Last CT of the chest in August 2015 showed stable 4.8 cm aneurysm. He states he uses his inhalers very little. No increased swelling.   Current Outpatient Prescriptions on File Prior to Visit  Medication Sig Dispense Refill  . acetaminophen (TYLENOL) 325 MG tablet Take 650 mg by mouth every 6 (six) hours as needed.    Marland Kitchen atenolol (TENORMIN) 25 MG tablet Take 25 mg by mouth daily.    Marland Kitchen atorvastatin (LIPITOR) 20 MG tablet Take 1 tablet (20 mg total) by mouth daily. 90 tablet 3  . Cholecalciferol (VITAMIN D-3) 5000 UNITS TABS Take 5,000 Units by mouth daily.    . finasteride (PROSCAR) 5 MG tablet Take 5 mg by mouth daily.    . fluticasone (FLONASE) 50 MCG/ACT nasal spray Place 2 sprays into the nose daily as needed for rhinitis.    . fluticasone (FLOVENT HFA) 110 MCG/ACT inhaler Inhale 2 puffs into the lungs 2 (two) times daily. 1 Inhaler 5  . furosemide (LASIX) 80 MG tablet Take 80 mg by mouth 2 (two) times daily.    . hydrOXYzine (VISTARIL) 25 MG capsule Take 25 mg by mouth as needed for itching. As needed    . loratadine-pseudoephedrine (CLARITIN-D 12-HOUR) 5-120 MG per tablet Take 1 tablet by mouth 2 (two) times daily.    .  montelukast (SINGULAIR) 10 MG tablet Take 10 mg by mouth at bedtime.    . nitroGLYCERIN (NITROSTAT) 0.4 MG SL tablet Place 0.4 mg under the tongue every 5 (five) minutes as needed for chest pain.    . pantoprazole (PROTONIX) 40 MG tablet Take 40 mg by mouth daily.    . potassium chloride SA (KLOR-CON M20) 20 MEQ tablet Take 2 tablets (40 mEq total) by mouth 2 (two) times daily. 360 tablet 0  . PROAIR HFA 108 (90 BASE) MCG/ACT inhaler INHALE 2 PUFFS INTO LUNGS EVERY 6 HOURS AS NEEDED FOR WHEEZING 8.5 Inhaler 2  . rivaroxaban (XARELTO) 20 MG TABS tablet TAKE 1 TABLET BY MOUTH EVERY DAY 90 tablet 1  . spironolactone (ALDACTONE) 25 MG tablet Take 1 tab daily    . tamsulosin (FLOMAX) 0.4 MG CAPS Take 0.4 mg by mouth daily.    . traMADol (ULTRAM) 50 MG tablet Take 50 mg by mouth every 6 (six) hours as needed for pain.     No current facility-administered medications on file prior to visit.    Allergies  Allergen Reactions  . Morphine Itching  . Adhesive [Tape] Itching and Rash    Where applied.    Past Medical History  Diagnosis Date  . Hypertension   . Obesities, morbid   . Pulmonary embolism     2008  . Anticoagulant long-term use   .  CAD (coronary artery disease)     a. s/p BMS to 2nd OM in Sept 2012; b. LexiScan Myoview (12/2012):  Inf infarct; bowel and motion artifact make study difficult to interpret; no ischemia; not gated; Low Risk  . Thoracic aortic aneurysm     followed by Dr. Servando Snare; last CT November 11, 2010 and unchanged  . OA (osteoarthritis)   . LVH (left ventricular hypertrophy)   . Colonic polyp   . Mild intermittent asthma   . Hemorrhoids   . Generalized headaches   . SOB (shortness of breath)     using oxygen with exercise  . Diastolic dysfunction   . ASCVD (arteriosclerotic cardiovascular disease)     Prior BMS to the 2nd OM in September 2012; with repeat cath in October showing patency  . GERD (gastroesophageal reflux disease)   . IBS (irritable bowel  syndrome)   . Aortic root enlargement   . Ascending aortic aneurysm     recent scan in October 2012 showing no change; followed by Dr. Servando Snare  . PAF (paroxysmal atrial fibrillation)     treated with Coumadin  . Hearing loss   . Contact lens/glasses fitting   . OSA (obstructive sleep apnea)     PSG 03/30/97 AHI 21, BPAP 13/9  . Sleep apnea     Past Surgical History  Procedure Laterality Date  . Hemorroidectomy    . Vasectomy    . Cervical spine surgery  06/02/2010    lower back and neck  . Gastric bypass  05/27/2007  . Cholecystectomy    . Rotator cuff repair      both  . Achilles tendon repair    . Cardiac catheterization  2006  . Coronary stent placement  Sept 2012    2nd OM with BMS  . Cardiac catheterization  October 2012    Stent patent  . Appendectomy    . Eye surgery      bilateral cataract  . Laminectomy  05/30/2012    L 4 L5    History  Smoking status  . Former Smoker -- 1.50 packs/day for 30 years  . Quit date: 01/24/1992  Smokeless tobacco  . Never Used    Comment: Stopped in 1983    History  Alcohol Use No    Family History  Problem Relation Age of Onset  . Heart disease Mother   . Diabetes Mother   . Other Mother     stent placement  . Emphysema Father 40  . Heart attack Sister     Review of Systems: The review of systems is per the HPI. All other systems were reviewed and are negative.  Physical Exam: BP 100/54 mmHg  Ht 5\' 7"  (1.702 m)  Wt 284 lb 6.4 oz (129.003 kg)  BMI 44.53 kg/m2   Patient is a pleasant obese white male no acute distress. He is morbidly obese. Skin is warm and dry. Color is normal.  HEENT is unremarkable. Normocephalic/atraumatic. PERRL. Sclera are nonicteric. Neck is supple. No masses. No JVD. Lungs are clear. Cardiac exam shows a regular rate and rhythm. No gallop or murmur. Abdomen is obese but soft. Bowel sounds are positive. Extremities are full but with tr- 1+ edema. Gait and ROM are intact. No gross neurologic  deficits noted.   LABORATORY DATA:  Lab Results  Component Value Date   WBC 6.7 01/06/2013   HGB 13.9 01/06/2013   HCT 41.8 01/06/2013   PLT 172 01/06/2013   GLUCOSE 99 01/07/2013  CHOL 118 01/07/2013   TRIG 133 01/07/2013   HDL 40 01/07/2013   LDLCALC 51 01/07/2013   ALT 13 01/06/2013   AST 18 01/06/2013   NA 140 01/07/2013   K 3.8 01/07/2013   CL 104 01/07/2013   CREATININE 1.10 09/11/2013   BUN 18 08/11/2013   CO2 28 01/07/2013   TSH 1.686 01/06/2013   INR 2.9 03/03/2013   HGBA1C * 08/19/2006    6.7 (NOTE)   The ADA recommends the following therapeutic goals for glycemic   control related to Hgb A1C measurement:   Goal of Therapy:   < 7.0% Hgb A1C   Action Suggested:  > 8.0% Hgb A1C   Ref:  Diabetes Care, 22, Suppl. 1, 1999   Lab Results  Component Value Date   CHOL 118 01/07/2013    Ecg today shows NSR with frequent PACs with short 4 beat run. Nonspecific TWA. I have personally reviewed and interpreted this study.  Assessment / Plan:  1. CAD - prior bare-metal stent of the OM in 2012. Symptoms now of chest pressure and dyspnea on exertion concerning for angina. Myoview study in Dec 2014 was low risk. Multiple other potential etiologies including diastolic CHF, pulmonary HTN, asthma, OSA, obesity. Will check Echo today. I anticipate that he will need a right and left heart cath. Prior cardiac cath was difficult due to access and thoracic aneurysm. May consider a left radial approach if needed.   2. Diastolic dysfunction -  Looks well compensated. Continue lasix 80 mg bid. Continue sodium restriction.  3. HTN -  Controlled.  4. Morbid obesity with OSA on CPAP  5. Thoracic aneurysm -followed by Dr. Servando Snare annually. Last CT showed stable appearance.  6. PAF - now on Xarelto. No sustained episodes.   7. HLD  8. Asthma.

## 2014-04-17 NOTE — Patient Instructions (Signed)
We will schedule you for an Echocardiogram  I will call you with the results and we will determine whether or not to do a heart cath.

## 2014-04-24 DIAGNOSIS — I509 Heart failure, unspecified: Secondary | ICD-10-CM | POA: Diagnosis not present

## 2014-04-24 DIAGNOSIS — R1032 Left lower quadrant pain: Secondary | ICD-10-CM | POA: Diagnosis not present

## 2014-04-24 DIAGNOSIS — E785 Hyperlipidemia, unspecified: Secondary | ICD-10-CM | POA: Diagnosis not present

## 2014-04-24 DIAGNOSIS — E119 Type 2 diabetes mellitus without complications: Secondary | ICD-10-CM | POA: Diagnosis not present

## 2014-04-24 DIAGNOSIS — I10 Essential (primary) hypertension: Secondary | ICD-10-CM | POA: Diagnosis not present

## 2014-04-27 ENCOUNTER — Ambulatory Visit (HOSPITAL_COMMUNITY)
Admission: RE | Admit: 2014-04-27 | Discharge: 2014-04-27 | Disposition: A | Discharge status: Home / Self Care | Payer: Medicare Other | Source: Ambulatory Visit | Attending: Cardiology | Admitting: Cardiology

## 2014-04-27 DIAGNOSIS — G4733 Obstructive sleep apnea (adult) (pediatric): Secondary | ICD-10-CM | POA: Insufficient documentation

## 2014-04-27 DIAGNOSIS — I712 Thoracic aortic aneurysm, without rupture, unspecified: Secondary | ICD-10-CM

## 2014-04-27 DIAGNOSIS — I480 Paroxysmal atrial fibrillation: Secondary | ICD-10-CM | POA: Diagnosis not present

## 2014-04-27 DIAGNOSIS — Z7901 Long term (current) use of anticoagulants: Secondary | ICD-10-CM | POA: Insufficient documentation

## 2014-04-27 DIAGNOSIS — I5032 Chronic diastolic (congestive) heart failure: Secondary | ICD-10-CM | POA: Insufficient documentation

## 2014-04-27 DIAGNOSIS — I2510 Atherosclerotic heart disease of native coronary artery without angina pectoris: Secondary | ICD-10-CM | POA: Diagnosis not present

## 2014-04-27 NOTE — Progress Notes (Addendum)
2D Echocardiogram Complete.  04/27/2014   Lunetta Marina, RDCS  Technically Difficult study due to body habitus.

## 2014-04-28 ENCOUNTER — Other Ambulatory Visit: Payer: Self-pay

## 2014-04-28 DIAGNOSIS — R0789 Other chest pain: Secondary | ICD-10-CM

## 2014-04-28 DIAGNOSIS — R0602 Shortness of breath: Secondary | ICD-10-CM

## 2014-04-30 LAB — BASIC METABOLIC PANEL
BUN: 18 mg/dL (ref 6–23)
CO2: 23 mEq/L (ref 19–32)
Calcium: 8.6 mg/dL (ref 8.4–10.5)
Chloride: 103 mEq/L (ref 96–112)
Creat: 0.9 mg/dL (ref 0.50–1.35)
Glucose, Bld: 147 mg/dL — ABNORMAL HIGH (ref 70–99)
Potassium: 4.5 mEq/L (ref 3.5–5.3)
Sodium: 138 mEq/L (ref 135–145)

## 2014-04-30 LAB — CBC WITH DIFFERENTIAL/PLATELET
Basophils Absolute: 0.1 10*3/uL (ref 0.0–0.1)
Basophils Relative: 1 % (ref 0–1)
Eosinophils Absolute: 0.1 10*3/uL (ref 0.0–0.7)
Eosinophils Relative: 2 % (ref 0–5)
HCT: 38.4 % — ABNORMAL LOW (ref 39.0–52.0)
Hemoglobin: 13.2 g/dL (ref 13.0–17.0)
Lymphocytes Relative: 30 % (ref 12–46)
Lymphs Abs: 2 10*3/uL (ref 0.7–4.0)
MCH: 30.9 pg (ref 26.0–34.0)
MCHC: 34.4 g/dL (ref 30.0–36.0)
MCV: 89.9 fL (ref 78.0–100.0)
MPV: 9.9 fL (ref 8.6–12.4)
Monocytes Absolute: 0.6 10*3/uL (ref 0.1–1.0)
Monocytes Relative: 9 % (ref 3–12)
Neutro Abs: 3.8 10*3/uL (ref 1.7–7.7)
Neutrophils Relative %: 58 % (ref 43–77)
Platelets: 168 10*3/uL (ref 150–400)
RBC: 4.27 MIL/uL (ref 4.22–5.81)
RDW: 14.4 % (ref 11.5–15.5)
WBC: 6.6 10*3/uL (ref 4.0–10.5)

## 2014-05-01 ENCOUNTER — Other Ambulatory Visit: Payer: Self-pay | Admitting: Cardiology

## 2014-05-01 DIAGNOSIS — R0600 Dyspnea, unspecified: Secondary | ICD-10-CM

## 2014-05-01 LAB — PROTIME-INR
INR: 2.21 — ABNORMAL HIGH (ref <1.50)
Prothrombin Time: 24.5 seconds — ABNORMAL HIGH (ref 11.6–15.2)

## 2014-05-04 ENCOUNTER — Other Ambulatory Visit: Payer: Self-pay | Admitting: Neurological Surgery

## 2014-05-04 DIAGNOSIS — M4802 Spinal stenosis, cervical region: Secondary | ICD-10-CM

## 2014-05-05 ENCOUNTER — Telehealth: Payer: Self-pay

## 2014-05-05 NOTE — Telephone Encounter (Signed)
Received a call from [place] with [facility] 05/04/14 wanting to know if ok for patient to hold xarelto day before neck steroid injection.Spoke to [place] he advised patient scheduled for cardiac cath 05/07/14 and would be ok to hold xarelto, but would be better to receive injection before cath.Left message on Danielle's personal voice mail with Dr.Jordan's recommendations.  Patient called advised of Dr.Jordan's recommendations.Stated he will call Danielle 05/06/14 to schedule steroid injection.

## 2014-05-07 ENCOUNTER — Ambulatory Visit (HOSPITAL_COMMUNITY)
Admission: RE | Admit: 2014-05-07 | Discharge: 2014-05-07 | Disposition: A | Discharge status: Home / Self Care | Payer: Medicare Other | Attending: Cardiology | Admitting: Cardiology

## 2014-05-07 ENCOUNTER — Encounter (HOSPITAL_COMMUNITY)
Admission: RE | Disposition: A | Discharge status: Home / Self Care | Payer: Self-pay | Source: Home / Self Care | Attending: Cardiology

## 2014-05-07 ENCOUNTER — Encounter (HOSPITAL_COMMUNITY): Payer: Self-pay | Admitting: Cardiology

## 2014-05-07 DIAGNOSIS — Z87891 Personal history of nicotine dependence: Secondary | ICD-10-CM | POA: Insufficient documentation

## 2014-05-07 DIAGNOSIS — I5032 Chronic diastolic (congestive) heart failure: Secondary | ICD-10-CM | POA: Diagnosis present

## 2014-05-07 DIAGNOSIS — I272 Other secondary pulmonary hypertension: Secondary | ICD-10-CM | POA: Insufficient documentation

## 2014-05-07 DIAGNOSIS — Z9049 Acquired absence of other specified parts of digestive tract: Secondary | ICD-10-CM | POA: Diagnosis not present

## 2014-05-07 DIAGNOSIS — K219 Gastro-esophageal reflux disease without esophagitis: Secondary | ICD-10-CM | POA: Insufficient documentation

## 2014-05-07 DIAGNOSIS — J45909 Unspecified asthma, uncomplicated: Secondary | ICD-10-CM | POA: Insufficient documentation

## 2014-05-07 DIAGNOSIS — I480 Paroxysmal atrial fibrillation: Secondary | ICD-10-CM | POA: Insufficient documentation

## 2014-05-07 DIAGNOSIS — Z7901 Long term (current) use of anticoagulants: Secondary | ICD-10-CM | POA: Insufficient documentation

## 2014-05-07 DIAGNOSIS — Z86711 Personal history of pulmonary embolism: Secondary | ICD-10-CM | POA: Diagnosis not present

## 2014-05-07 DIAGNOSIS — E785 Hyperlipidemia, unspecified: Secondary | ICD-10-CM | POA: Diagnosis not present

## 2014-05-07 DIAGNOSIS — Z91048 Other nonmedicinal substance allergy status: Secondary | ICD-10-CM | POA: Insufficient documentation

## 2014-05-07 DIAGNOSIS — I712 Thoracic aortic aneurysm, without rupture, unspecified: Secondary | ICD-10-CM | POA: Diagnosis present

## 2014-05-07 DIAGNOSIS — I2510 Atherosclerotic heart disease of native coronary artery without angina pectoris: Secondary | ICD-10-CM | POA: Insufficient documentation

## 2014-05-07 DIAGNOSIS — R079 Chest pain, unspecified: Secondary | ICD-10-CM | POA: Diagnosis not present

## 2014-05-07 DIAGNOSIS — Z955 Presence of coronary angioplasty implant and graft: Secondary | ICD-10-CM | POA: Insufficient documentation

## 2014-05-07 DIAGNOSIS — Z86010 Personal history of colonic polyps: Secondary | ICD-10-CM | POA: Diagnosis not present

## 2014-05-07 DIAGNOSIS — M1990 Unspecified osteoarthritis, unspecified site: Secondary | ICD-10-CM | POA: Diagnosis not present

## 2014-05-07 DIAGNOSIS — I5030 Unspecified diastolic (congestive) heart failure: Secondary | ICD-10-CM | POA: Insufficient documentation

## 2014-05-07 DIAGNOSIS — G4733 Obstructive sleep apnea (adult) (pediatric): Secondary | ICD-10-CM | POA: Diagnosis not present

## 2014-05-07 DIAGNOSIS — I10 Essential (primary) hypertension: Secondary | ICD-10-CM | POA: Diagnosis not present

## 2014-05-07 DIAGNOSIS — R0600 Dyspnea, unspecified: Secondary | ICD-10-CM | POA: Diagnosis present

## 2014-05-07 HISTORY — PX: LEFT AND RIGHT HEART CATHETERIZATION WITH CORONARY ANGIOGRAM: SHX5449

## 2014-05-07 LAB — POCT I-STAT 3, VENOUS BLOOD GAS (G3P V)
Acid-base deficit: 3 mmol/L — ABNORMAL HIGH (ref 0.0–2.0)
Bicarbonate: 23 mEq/L (ref 20.0–24.0)
O2 Saturation: 61 %
TCO2: 24 mmol/L (ref 0–100)
pCO2, Ven: 44.8 mmHg — ABNORMAL LOW (ref 45.0–50.0)
pH, Ven: 7.318 — ABNORMAL HIGH (ref 7.250–7.300)
pO2, Ven: 34 mmHg (ref 30.0–45.0)

## 2014-05-07 LAB — POCT I-STAT 3, ART BLOOD GAS (G3+)
Acid-base deficit: 3 mmol/L — ABNORMAL HIGH (ref 0.0–2.0)
Bicarbonate: 22.6 mEq/L (ref 20.0–24.0)
O2 Saturation: 91 %
TCO2: 24 mmol/L (ref 0–100)
pCO2 arterial: 42.3 mmHg (ref 35.0–45.0)
pH, Arterial: 7.336 — ABNORMAL LOW (ref 7.350–7.450)
pO2, Arterial: 66 mmHg — ABNORMAL LOW (ref 80.0–100.0)

## 2014-05-07 LAB — GLUCOSE, CAPILLARY
Glucose-Capillary: 135 mg/dL — ABNORMAL HIGH (ref 70–99)
Glucose-Capillary: 113 mg/dL — ABNORMAL HIGH (ref 70–99)

## 2014-05-07 SURGERY — LEFT AND RIGHT HEART CATHETERIZATION WITH CORONARY ANGIOGRAM
Anesthesia: LOCAL

## 2014-05-07 MED ORDER — FUROSEMIDE 80 MG PO TABS
80.0000 mg | ORAL_TABLET | Freq: Every day | ORAL | Status: DC
Start: 2014-05-07 — End: 2016-06-06

## 2014-05-07 MED ORDER — SODIUM CHLORIDE 0.9 % IV SOLN: 250.0000 mL | INTRAVENOUS | Status: DC | PRN

## 2014-05-07 MED ORDER — ASPIRIN 81 MG PO CHEW
81.0000 mg | CHEWABLE_TABLET | ORAL | Status: AC
  Administered 2014-05-07: 81 mg via ORAL

## 2014-05-07 MED ORDER — LIDOCAINE HCL (PF) 1 % IJ SOLN
INTRAMUSCULAR | Status: AC
  Filled 2014-05-07: qty 30

## 2014-05-07 MED ORDER — SODIUM CHLORIDE 0.9 % IV SOLN
INTRAVENOUS | Status: DC
  Administered 2014-05-07: 08:00:00 via INTRAVENOUS

## 2014-05-07 MED ORDER — SODIUM CHLORIDE 0.9 % IJ SOLN: 3.0000 mL | Freq: Two times a day (BID) | INTRAMUSCULAR | Status: DC

## 2014-05-07 MED ORDER — MIDAZOLAM HCL 2 MG/2ML IJ SOLN
INTRAMUSCULAR | Status: AC
  Filled 2014-05-07: qty 2

## 2014-05-07 MED ORDER — HEPARIN SODIUM (PORCINE) 1000 UNIT/ML IJ SOLN
INTRAMUSCULAR | Status: AC
  Filled 2014-05-07: qty 1

## 2014-05-07 MED ORDER — VERAPAMIL HCL 2.5 MG/ML IV SOLN
INTRAVENOUS | Status: AC
  Filled 2014-05-07: qty 2

## 2014-05-07 MED ORDER — SODIUM CHLORIDE 0.9 % IV SOLN: 1.0000 mL/kg/h | INTRAVENOUS | Status: DC

## 2014-05-07 MED ORDER — ASPIRIN 81 MG PO CHEW
CHEWABLE_TABLET | ORAL | Status: AC
  Filled 2014-05-07: qty 1

## 2014-05-07 MED ORDER — HEPARIN (PORCINE) IN NACL 2-0.9 UNIT/ML-% IJ SOLN
INTRAMUSCULAR | Status: AC
  Filled 2014-05-07: qty 1000

## 2014-05-07 MED ORDER — FENTANYL CITRATE 0.05 MG/ML IJ SOLN
INTRAMUSCULAR | Status: AC
  Filled 2014-05-07: qty 2

## 2014-05-07 MED ORDER — SODIUM CHLORIDE 0.9 % IJ SOLN: 3.0000 mL | INTRAMUSCULAR | Status: DC | PRN

## 2014-05-07 NOTE — Interval H&P Note (Signed)
History and Physical Interval Note:  05/07/2014 8:44 AM  Carlos Dawson  has presented today for surgery, with the diagnosis of pulmonary pressure assessment, sob  The various methods of treatment have been discussed with the patient and family. After consideration of risks, benefits and other options for treatment, the patient has consented to  Procedure(s): LEFT AND RIGHT HEART CATHETERIZATION WITH CORONARY ANGIOGRAM (N/A) as a surgical intervention .  The patient's history has been reviewed, patient examined, no change in status, stable for surgery.  I have reviewed the patient's chart and labs.  Questions were answered to the patient's satisfaction.   Cath Lab Visit (complete for each Cath Lab visit)  Clinical Evaluation Leading to the Procedure:   ACS: No.  Non-ACS:    Anginal Classification: CCS III  Anti-ischemic medical therapy: Minimal Therapy (1 class of medications)  Non-Invasive Test Results: No non-invasive testing performed  Prior CABG: No previous CABG        Collier Salina [facility] 05/07/2014 8:46 AM

## 2014-05-07 NOTE — H&P (View-Only) (Signed)
Carlos Dawson Date of Birth: [birth year] Medical Record #[id number]  History of Present Illness: Carlos Dawson is seen today for evaluation of dyspnea and chest pain. He has multiple medical issues including CAD, thoracic aneurysm, history of pulmonary emboli, morbid obesity with prior lap banding, HTN, chronic edema and PAF. He he is on chronic Xarelto. He underwent stenting of the obtuse marginal vessel in September of 2012. On follow up today he reports a 4 month history of increased dyspnea on exertion associated with symptoms of chest pressure. It feels like someone stepping on his chest. Pulse ox has been 96-98%. HR has been normal. When he gets SOB he feels like he hyperventilates and almost like he cant get any air. He just completed a coarse of steroids for back pain. Last CT of the chest in August 2015 showed stable 4.8 cm aneurysm. He states he uses his inhalers very little. No increased swelling.   Current Outpatient Prescriptions on File Prior to Visit  Medication Sig Dispense Refill  . acetaminophen (TYLENOL) 325 MG tablet Take 650 mg by mouth every 6 (six) hours as needed.    Marland Kitchen atenolol (TENORMIN) 25 MG tablet Take 25 mg by mouth daily.    Marland Kitchen atorvastatin (LIPITOR) 20 MG tablet Take 1 tablet (20 mg total) by mouth daily. 90 tablet 3  . Cholecalciferol (VITAMIN D-3) 5000 UNITS TABS Take 5,000 Units by mouth daily.    . finasteride (PROSCAR) 5 MG tablet Take 5 mg by mouth daily.    . fluticasone (FLONASE) 50 MCG/ACT nasal spray Place 2 sprays into the nose daily as needed for rhinitis.    . fluticasone (FLOVENT HFA) 110 MCG/ACT inhaler Inhale 2 puffs into the lungs 2 (two) times daily. 1 Inhaler 5  . furosemide (LASIX) 80 MG tablet Take 80 mg by mouth 2 (two) times daily.    . hydrOXYzine (VISTARIL) 25 MG capsule Take 25 mg by mouth as needed for itching. As needed    . loratadine-pseudoephedrine (CLARITIN-D 12-HOUR) 5-120 MG per tablet Take 1 tablet by mouth 2 (two) times daily.    .  montelukast (SINGULAIR) 10 MG tablet Take 10 mg by mouth at bedtime.    . nitroGLYCERIN (NITROSTAT) 0.4 MG SL tablet Place 0.4 mg under the tongue every 5 (five) minutes as needed for chest pain.    . pantoprazole (PROTONIX) 40 MG tablet Take 40 mg by mouth daily.    . potassium chloride SA (KLOR-CON M20) 20 MEQ tablet Take 2 tablets (40 mEq total) by mouth 2 (two) times daily. 360 tablet 0  . PROAIR HFA 108 (90 BASE) MCG/ACT inhaler INHALE 2 PUFFS INTO LUNGS EVERY 6 HOURS AS NEEDED FOR WHEEZING 8.5 Inhaler 2  . rivaroxaban (XARELTO) 20 MG TABS tablet TAKE 1 TABLET BY MOUTH EVERY DAY 90 tablet 1  . spironolactone (ALDACTONE) 25 MG tablet Take 1 tab daily    . tamsulosin (FLOMAX) 0.4 MG CAPS Take 0.4 mg by mouth daily.    . traMADol (ULTRAM) 50 MG tablet Take 50 mg by mouth every 6 (six) hours as needed for pain.     No current facility-administered medications on file prior to visit.    Allergies  Allergen Reactions  . Morphine Itching  . Adhesive [Tape] Itching and Rash    Where applied.    Past Medical History  Diagnosis Date  . Hypertension   . Obesities, morbid   . Pulmonary embolism     2008  . Anticoagulant long-term use   .  CAD (coronary artery disease)     a. s/p BMS to 2nd OM in Sept 2012; b. LexiScan Myoview (12/2012):  Inf infarct; bowel and motion artifact make study difficult to interpret; no ischemia; not gated; Low Risk  . Thoracic aortic aneurysm     followed by Dr. Servando Snare; last CT November 11, 2010 and unchanged  . OA (osteoarthritis)   . LVH (left ventricular hypertrophy)   . Colonic polyp   . Mild intermittent asthma   . Hemorrhoids   . Generalized headaches   . SOB (shortness of breath)     using oxygen with exercise  . Diastolic dysfunction   . ASCVD (arteriosclerotic cardiovascular disease)     Prior BMS to the 2nd OM in September 2012; with repeat cath in October showing patency  . GERD (gastroesophageal reflux disease)   . IBS (irritable bowel  syndrome)   . Aortic root enlargement   . Ascending aortic aneurysm     recent scan in October 2012 showing no change; followed by Dr. Servando Snare  . PAF (paroxysmal atrial fibrillation)     treated with Coumadin  . Hearing loss   . Contact lens/glasses fitting   . OSA (obstructive sleep apnea)     PSG 03/30/97 AHI 21, BPAP 13/9  . Sleep apnea     Past Surgical History  Procedure Laterality Date  . Hemorroidectomy    . Vasectomy    . Cervical spine surgery  06/02/2010    lower back and neck  . Gastric bypass  05/27/2007  . Cholecystectomy    . Rotator cuff repair      both  . Achilles tendon repair    . Cardiac catheterization  2006  . Coronary stent placement  Sept 2012    2nd OM with BMS  . Cardiac catheterization  October 2012    Stent patent  . Appendectomy    . Eye surgery      bilateral cataract  . Laminectomy  05/30/2012    L 4 L5    History  Smoking status  . Former Smoker -- 1.50 packs/day for 30 years  . Quit date: 01/24/1992  Smokeless tobacco  . Never Used    Comment: Stopped in 1983    History  Alcohol Use No    Family History  Problem Relation Age of Onset  . Heart disease Mother   . Diabetes Mother   . Other Mother     stent placement  . Emphysema Father 29  . Heart attack Sister     Review of Systems: The review of systems is per the HPI. All other systems were reviewed and are negative.  Physical Exam: BP 100/54 mmHg  Ht 5\' 7"  (1.702 m)  Wt 284 lb 6.4 oz (129.003 kg)  BMI 44.53 kg/m2   Patient is a pleasant obese white male no acute distress. He is morbidly obese. Skin is warm and dry. Color is normal.  HEENT is unremarkable. Normocephalic/atraumatic. PERRL. Sclera are nonicteric. Neck is supple. No masses. No JVD. Lungs are clear. Cardiac exam shows a regular rate and rhythm. No gallop or murmur. Abdomen is obese but soft. Bowel sounds are positive. Extremities are full but with tr- 1+ edema. Gait and ROM are intact. No gross neurologic  deficits noted.   LABORATORY DATA:  Lab Results  Component Value Date   WBC 6.7 01/06/2013   HGB 13.9 01/06/2013   HCT 41.8 01/06/2013   PLT 172 01/06/2013   GLUCOSE 99 01/07/2013  CHOL 118 01/07/2013   TRIG 133 01/07/2013   HDL 40 01/07/2013   LDLCALC 51 01/07/2013   ALT 13 01/06/2013   AST 18 01/06/2013   NA 140 01/07/2013   K 3.8 01/07/2013   CL 104 01/07/2013   CREATININE 1.10 09/11/2013   BUN 18 08/11/2013   CO2 28 01/07/2013   TSH 1.686 01/06/2013   INR 2.9 03/03/2013   HGBA1C * 08/19/2006    6.7 (NOTE)   The ADA recommends the following therapeutic goals for glycemic   control related to Hgb A1C measurement:   Goal of Therapy:   < 7.0% Hgb A1C   Action Suggested:  > 8.0% Hgb A1C   Ref:  Diabetes Care, 22, Suppl. 1, 1999   Lab Results  Component Value Date   CHOL 118 01/07/2013    Ecg today shows NSR with frequent PACs with short 4 beat run. Nonspecific TWA. I have personally reviewed and interpreted this study.  Assessment / Plan:  1. CAD - prior bare-metal stent of the OM in 2012. Symptoms now of chest pressure and dyspnea on exertion concerning for angina. Myoview study in Dec 2014 was low risk. Multiple other potential etiologies including diastolic CHF, pulmonary HTN, asthma, OSA, obesity. Will check Echo today. I anticipate that he will need a right and left heart cath. Prior cardiac cath was difficult due to access and thoracic aneurysm. May consider a left radial approach if needed.   2. Diastolic dysfunction -  Looks well compensated. Continue lasix 80 mg bid. Continue sodium restriction.  3. HTN -  Controlled.  4. Morbid obesity with OSA on CPAP  5. Thoracic aneurysm -followed by Dr. Servando Snare annually. Last CT showed stable appearance.  6. PAF - now on Xarelto. No sustained episodes.   7. HLD  8. Asthma.

## 2014-05-07 NOTE — Progress Notes (Signed)
TR BAND REMOVAL  LOCATION:    left radial  DEFLATED PER PROTOCOL:    Yes.    TIME BAND OFF / DRESSING APPLIED:    1215   SITE UPON ARRIVAL:    Level 0  SITE AFTER BAND REMOVAL:    Level 0  CIRCULATION SENSATION AND MOVEMENT:    Within Normal Limits   Yes.    COMMENTS:   TRB REMOVED/ TEGADERM DSG APPLIED.

## 2014-05-07 NOTE — Discharge Instructions (Addendum)
Hold metformin for 48 hours. Resume 05/10/14.   Increase lasix to 80 mg in the am and 40 mg in the afternoon.  You may resume Xarelto this evening if no bleeding or bruising.    Radial Site Care Refer to this sheet in the next few weeks. These instructions provide you with information on caring for yourself after your procedure. Your caregiver may also give you more specific instructions. Your treatment has been planned according to current medical practices, but problems sometimes occur. Call your caregiver if you have any problems or questions after your procedure. HOME CARE INSTRUCTIONS  You may shower the day after the procedure.Remove the bandage (dressing) and gently wash the site with plain soap and water.Gently pat the site dry.  Do not apply powder or lotion to the site.  Do not submerge the affected site in water for 3 to 5 days.  Inspect the site at least twice daily.  Do not flex or bend the affected arm for 24 hours.  No lifting over 5 pounds (2.3 kg) for 5 days after your procedure.  Do not drive home if you are discharged the same day of the procedure. Have someone else drive you.  You may drive 24 hours after the procedure unless otherwise instructed by your caregiver.  Do not operate machinery or power tools for 24 hours.  A responsible adult should be with you for the first 24 hours after you arrive home. What to expect:  Any bruising will usually fade within 1 to 2 weeks.  Blood that collects in the tissue (hematoma) may be painful to the touch. It should usually decrease in size and tenderness within 1 to 2 weeks. SEEK IMMEDIATE MEDICAL CARE IF:  You have unusual pain at the radial site.  You have redness, warmth, swelling, or pain at the radial site.  You have drainage (other than a small amount of blood on the dressing).  You have chills.  You have a fever or persistent symptoms for more than 72 hours.  You have a fever and your symptoms suddenly  get worse.  Your arm becomes pale, cool, tingly, or numb.  You have heavy bleeding from the site. Hold pressure on the site. Document Released: 02/11/2010 Document Revised: 04/03/2011 Document Reviewed: 02/11/2010 [facility] Patient Information 2015 [place], [place]. This information is not intended to replace advice given to you by your health care provider. Make sure you discuss any questions you have with your health care provider.

## 2014-05-07 NOTE — CV Procedure (Signed)
    Cardiac Catheterization Procedure Note  Name: Carlos Dawson MRN: [id number] DOB: August 16, 1940  Procedure: Right Heart Cath, Left Heart Cath, Selective Coronary Angiography  Indication: 74 yo WM with obesity, diastolic CHF, Afib and prior stenting of the OM1 presents with increased dyspnea on exertion and chest pain.    Procedural Details: The left wrist was prepped, draped, and anesthetized with 1% lidocaine. Using the modified Seldinger technique a 6 Fr slender sheath was placed in the left radial artery. A 5 French sheath was placed in the right brachial vein. A Swan-Ganz catheter was used for the right heart catheterization. Standard protocol was followed for recording of right heart pressures and sampling of oxygen saturations. Fick cardiac output was calculated. A JL6 catheter was used to engage the LCA. A LA2 catheter was used to engage the RCA.  Due to distortion of the aortic root we were unable to cross the AV despite using a pigtail and LA2 catheters and J and straight wires.  There were no immediate procedural complications. The patient was transferred to the post catheterization recovery area for further monitoring.  Procedural Findings: Hemodynamics RA 16/17 mean 14 mm Hg RV 47/12/17 mm Hg PA 48/15 mean 31 mm Hg PCWP 25/24 mean 22 mm Hg LV N/A AO 103/63 mean 80 mm Hg  Oxygen saturations: PA 61% AO 91%  Cardiac Output (Fick) 5.7 L/min  Cardiac Index (Fick) 2.47 L/min/meter squared.   Coronary angiography: Coronary dominance: left  Left mainstem: The left main is a shared ostia for the LAD and LCx. It is normal.  Left anterior descending (LAD): The LAD is a relatively small branch giving off one diagonal branch. It is normal.  Left circumflex (LCx): The LCx is a large dominant vessel without significant disease. The stented segment of the first OM is widely patent.  Right coronary artery (RCA): The RCA is small and nondominant with a large conus branch. It is  normal.   Left ventriculography: Not done  Final Conclusions:   1. No obstructive CAD. The OM stent is widely patent.  2. Mild pulmonary HTN with elevated LV filling pressures.   Recommendations: will increase diuretic therapy. Otherwise continue medication. Resume anticoagulant therapy.    Sholonda Jobst [place], [place] 05/07/2014, 9:58 AM

## 2014-05-07 NOTE — Progress Notes (Signed)
Site area: right brachial  Site Prior to Removal:  Level 0  Pressure Applied For 10 MINUTES    Minutes Beginning at 1025  Manual:   Yes.    Patient Status During Pull:  stable  Post Pull Brachial Site:  Level 0  Post Pull Instructions Given:  Yes.    Post Pull Pulses Present:  Yes.    Dressing Applied:  Yes.     Bedrest Begins: 9390  Comments:  Pt tolerated right venous brachial sheath pull well.

## 2014-05-18 ENCOUNTER — Other Ambulatory Visit: Payer: Self-pay | Admitting: Gastroenterology

## 2014-05-18 DIAGNOSIS — M7581 Other shoulder lesions, right shoulder: Secondary | ICD-10-CM | POA: Diagnosis not present

## 2014-05-21 ENCOUNTER — Ambulatory Visit
Admission: RE | Admit: 2014-05-21 | Discharge: 2014-05-21 | Disposition: A | Discharge status: Home / Self Care | Payer: Medicare Other | Source: Ambulatory Visit | Attending: Neurological Surgery | Admitting: Neurological Surgery

## 2014-05-21 DIAGNOSIS — M4802 Spinal stenosis, cervical region: Secondary | ICD-10-CM

## 2014-05-21 DIAGNOSIS — M47812 Spondylosis without myelopathy or radiculopathy, cervical region: Secondary | ICD-10-CM | POA: Diagnosis not present

## 2014-05-21 DIAGNOSIS — M9971 Connective tissue and disc stenosis of intervertebral foramina of cervical region: Secondary | ICD-10-CM | POA: Diagnosis not present

## 2014-05-21 MED ORDER — TRIAMCINOLONE ACETONIDE 40 MG/ML IJ SUSP (RADIOLOGY)
60.0000 mg | Freq: Once | INTRAMUSCULAR | Status: AC
  Administered 2014-05-21: 60 mg via EPIDURAL

## 2014-05-21 MED ORDER — IOHEXOL 300 MG/ML  SOLN
1.0000 mL | Freq: Once | INTRAMUSCULAR | Status: AC | PRN
  Administered 2014-05-21: 1 mL via EPIDURAL

## 2014-05-21 NOTE — Progress Notes (Signed)
Patient states he has been off Xarelto for the past two days.

## 2014-05-22 ENCOUNTER — Telehealth: Payer: Self-pay

## 2014-05-22 NOTE — Telephone Encounter (Signed)
Returned call to [employer] with [place] left message on her personal voice mail Dr.Jordan advised ok for steroid injection if spine injection hold Xarelto 48 hours prior,if neck injection hold Xarelto 72 hours prior. Note faxed to [facility] at fax # [phone].

## 2014-05-25 DIAGNOSIS — R3912 Poor urinary stream: Secondary | ICD-10-CM | POA: Diagnosis not present

## 2014-05-25 DIAGNOSIS — N3941 Urge incontinence: Secondary | ICD-10-CM | POA: Diagnosis not present

## 2014-05-25 DIAGNOSIS — R350 Frequency of micturition: Secondary | ICD-10-CM | POA: Diagnosis not present

## 2014-05-25 DIAGNOSIS — R3915 Urgency of urination: Secondary | ICD-10-CM | POA: Diagnosis not present

## 2014-05-25 DIAGNOSIS — N528 Other male erectile dysfunction: Secondary | ICD-10-CM | POA: Diagnosis not present

## 2014-05-25 DIAGNOSIS — N401 Enlarged prostate with lower urinary tract symptoms: Secondary | ICD-10-CM | POA: Diagnosis not present

## 2014-05-25 DIAGNOSIS — R972 Elevated prostate specific antigen [PSA]: Secondary | ICD-10-CM | POA: Diagnosis not present

## 2014-06-01 ENCOUNTER — Ambulatory Visit: Payer: Medicare Other | Admitting: Cardiology

## 2014-06-08 ENCOUNTER — Ambulatory Visit (INDEPENDENT_AMBULATORY_CARE_PROVIDER_SITE_OTHER): Payer: Medicare Other | Admitting: Cardiology

## 2014-06-08 ENCOUNTER — Encounter: Payer: Self-pay | Admitting: Cardiology

## 2014-06-08 VITALS — BP 106/70 | HR 84 | Ht 67.0 in | Wt 281.9 lb

## 2014-06-08 DIAGNOSIS — I2510 Atherosclerotic heart disease of native coronary artery without angina pectoris: Secondary | ICD-10-CM | POA: Diagnosis not present

## 2014-06-08 DIAGNOSIS — I5032 Chronic diastolic (congestive) heart failure: Secondary | ICD-10-CM | POA: Diagnosis not present

## 2014-06-08 DIAGNOSIS — I10 Essential (primary) hypertension: Secondary | ICD-10-CM

## 2014-06-08 DIAGNOSIS — Z7901 Long term (current) use of anticoagulants: Secondary | ICD-10-CM

## 2014-06-08 DIAGNOSIS — I712 Thoracic aortic aneurysm, without rupture, unspecified: Secondary | ICD-10-CM

## 2014-06-08 DIAGNOSIS — I480 Paroxysmal atrial fibrillation: Secondary | ICD-10-CM

## 2014-06-08 NOTE — Progress Notes (Signed)
Carlos Dawson Date of Birth: January 16, 1941 Medical Record #[id number]  History of Present Illness: Carlos Dawson is seen today for follow up post cath. He has multiple medical issues including CAD, thoracic aneurysm, history of pulmonary emboli, morbid obesity with prior lap banding, HTN, chronic edema and PAF. He he is on chronic Xarelto. He underwent stenting of the obtuse marginal vessel in September of 2012.  Last CT of the chest in August 2015 showed stable 4.8 cm aneurysm. He was seen earlier this year with symptoms of increased dyspnea and chest pressure. Cardiac cath showed no significant CAD. LV function was good. LV filling pressures were high and he had mild pulmonary HTN. Lasix dose was increased to 80/40 mg and since then he has lost 3 lbs. Breathing is a little better. He is walking more. He had an epidural injection in his neck as well as an injection in his shoulder. He eats a high salt diet.  Current Outpatient Prescriptions on File Prior to Visit  Medication Sig Dispense Refill  . acetaminophen (TYLENOL) 325 MG tablet Take 650 mg by mouth every 6 (six) hours as needed.    Marland Kitchen atenolol (TENORMIN) 25 MG tablet Take 25 mg by mouth daily.    Marland Kitchen atorvastatin (LIPITOR) 20 MG tablet Take 1 tablet (20 mg total) by mouth daily. 90 tablet 3  . Cholecalciferol (VITAMIN D-3) 5000 UNITS TABS Take 5,000 Units by mouth daily.    . finasteride (PROSCAR) 5 MG tablet Take 5 mg by mouth daily.    . fluticasone (FLONASE) 50 MCG/ACT nasal spray Place 2 sprays into the nose daily as needed for rhinitis.    . fluticasone (FLOVENT HFA) 110 MCG/ACT inhaler Inhale 2 puffs into the lungs 2 (two) times daily. 1 Inhaler 5  . furosemide (LASIX) 80 MG tablet Take 1 tablet (80 mg total) by mouth daily. Take 80 mg in the morning and 40 mg in the afternoon.    . hydrOXYzine (VISTARIL) 25 MG capsule Take 25 mg by mouth as needed for itching. As needed    . loratadine-pseudoephedrine (CLARITIN-D 12-HOUR) 5-120 MG per tablet  Take 1 tablet by mouth 2 (two) times daily.    Marland Kitchen losartan (COZAAR) 100 MG tablet Take 50 mg by mouth daily.    . montelukast (SINGULAIR) 10 MG tablet Take 10 mg by mouth at bedtime.    . nitroGLYCERIN (NITROSTAT) 0.4 MG SL tablet Place 0.4 mg under the tongue every 5 (five) minutes as needed for chest pain.    . pantoprazole (PROTONIX) 40 MG tablet Take 40 mg by mouth daily.    . potassium chloride SA (KLOR-CON M20) 20 MEQ tablet Take 2 tablets (40 mEq total) by mouth 2 (two) times daily. 360 tablet 0  . PROAIR HFA 108 (90 BASE) MCG/ACT inhaler INHALE 2 PUFFS INTO LUNGS EVERY 6 HOURS AS NEEDED FOR WHEEZING 8.5 Inhaler 2  . rivaroxaban (XARELTO) 20 MG TABS tablet TAKE 1 TABLET BY MOUTH EVERY DAY 90 tablet 1  . spironolactone (ALDACTONE) 25 MG tablet Take 1 tab daily    . tamsulosin (FLOMAX) 0.4 MG CAPS Take 0.4 mg by mouth daily.    . traMADol (ULTRAM) 50 MG tablet Take 50 mg by mouth every 6 (six) hours as needed for pain.     No current facility-administered medications on file prior to visit.    Allergies  Allergen Reactions  . Morphine Itching  . Adhesive [Tape] Itching and Rash    Where applied.    Past  Medical History  Diagnosis Date  . Hypertension   . Obesities, morbid   . Pulmonary embolism     2008  . Anticoagulant long-term use   . CAD (coronary artery disease)     a. s/p BMS to 2nd OM in Sept 2012; b. LexiScan Myoview (12/2012):  Inf infarct; bowel and motion artifact make study difficult to interpret; no ischemia; not gated; Low Risk  . Thoracic aortic aneurysm     followed by Dr. Servando Snare; last CT November 11, 2010 and unchanged  . OA (osteoarthritis)   . LVH (left ventricular hypertrophy)   . Colonic polyp   . Mild intermittent asthma   . Hemorrhoids   . Generalized headaches   . SOB (shortness of breath)     using oxygen with exercise  . Diastolic dysfunction   . ASCVD (arteriosclerotic cardiovascular disease)     Prior BMS to the 2nd OM in September 2012;  with repeat cath in October showing patency  . GERD (gastroesophageal reflux disease)   . IBS (irritable bowel syndrome)   . Aortic root enlargement   . Ascending aortic aneurysm     recent scan in October 2012 showing no change; followed by Dr. Servando Snare  . PAF (paroxysmal atrial fibrillation)     treated with Coumadin  . Hearing loss   . Contact lens/glasses fitting   . OSA (obstructive sleep apnea)     PSG 03/30/97 AHI 21, BPAP 13/9  . Sleep apnea     Past Surgical History  Procedure Laterality Date  . Hemorroidectomy    . Vasectomy    . Cervical spine surgery  06/02/2010    lower back and neck  . Gastric bypass  05/27/2007  . Cholecystectomy    . Rotator cuff repair      both  . Achilles tendon repair    . Cardiac catheterization  2006  . Coronary stent placement  Sept 2012    2nd OM with BMS  . Cardiac catheterization  October 2012    Stent patent  . Appendectomy    . Eye surgery      bilateral cataract  . Laminectomy  05/30/2012    L 4 L5  . Left and right heart catheterization with coronary angiogram N/A 05/07/2014    Procedure: LEFT AND RIGHT HEART CATHETERIZATION WITH CORONARY ANGIOGRAM;  Surgeon: Legrand Lasser M [place], MD;  Location: [facility] CATH LAB;  Service: Cardiovascular;  Laterality: N/A;    History  Smoking status  . Former Smoker -- 1.50 packs/day for 30 years  . Quit date: 01/24/1992  Smokeless tobacco  . Never Used    Comment: Stopped in 1983    History  Alcohol Use No    Family History  Problem Relation Age of Onset  . Heart disease Mother   . Diabetes Mother   . Other Mother     stent placement  . Emphysema Father 84  . Heart attack Sister     Review of Systems: The review of systems is per the HPI. All other systems were reviewed and are negative.  Physical Exam: BP 106/70 mmHg  Pulse 84  Ht 5\' 7"  (1.702 m)  Wt 281 lb 14.4 oz (127.869 kg)  BMI 44.14 kg/m2   Patient is a pleasant obese white male no acute distress. He is morbidly obese.  Skin is warm and dry. Color is normal.  HEENT is unremarkable. Normocephalic/atraumatic. PERRL. Sclera are nonicteric. Neck is supple. No masses. No JVD. Lungs are clear. Cardiac exam  shows a regular rate and rhythm. No gallop or murmur. Abdomen is obese but soft. Bowel sounds are positive. Extremities are full but with tr- 1+ edema. Gait and ROM are intact. No gross neurologic deficits noted.   LABORATORY DATA:  Lab Results  Component Value Date   WBC 6.6 04/28/2014   HGB 13.2 04/28/2014   HCT 38.4* 04/28/2014   PLT 168 04/28/2014   GLUCOSE 147* 04/28/2014   CHOL 118 01/07/2013   TRIG 133 01/07/2013   HDL 40 01/07/2013   LDLCALC 51 01/07/2013   ALT 13 01/06/2013   AST 18 01/06/2013   NA 138 04/28/2014   K 4.5 04/28/2014   CL 103 04/28/2014   CREATININE 0.90 04/28/2014   BUN 18 04/28/2014   CO2 23 04/28/2014   TSH 1.686 01/06/2013   INR 2.21* 04/28/2014   HGBA1C * 08/19/2006    6.7 (NOTE)   The ADA recommends the following therapeutic goals for glycemic   control related to Hgb A1C measurement:   Goal of Therapy:   < 7.0% Hgb A1C   Action Suggested:  > 8.0% Hgb A1C   Ref:  Diabetes Care, 22, Suppl. 1, 1999   Lab Results  Component Value Date   CHOL 118 01/07/2013      Assessment / Plan:  1. CAD - prior bare-metal stent of the OM in 2012.  Myoview study in Dec 2014 was low risk. Recent Cath showed no obstructive disease. Continue risk factor modification.  2. Chronic Diastolic CHF -  Recent cath showed elevated LV filling pressures and mild pulmonary HTN. Lasix increased.  Continue lasix at current dose. Needs to restrict sodium better.  Encourage increased aerobic activity and weight loss.   3. HTN -  Controlled.  4. Morbid obesity with OSA on CPAP  5. Thoracic aneurysm -followed by Dr. Servando Snare annually. Last CT showed stable appearance.  6. PAF - now on Xarelto. No sustained episodes.   7. HLD  8. Asthma.

## 2014-06-08 NOTE — Patient Instructions (Signed)
You need to restrict your sodium intake to less than 2000 mg daily  Continue your current therapy  I will see you in 4 months

## 2014-06-10 DIAGNOSIS — Z9884 Bariatric surgery status: Secondary | ICD-10-CM | POA: Diagnosis not present

## 2014-06-30 ENCOUNTER — Other Ambulatory Visit: Payer: Self-pay | Admitting: Orthopedic Surgery

## 2014-06-30 DIAGNOSIS — M25511 Pain in right shoulder: Secondary | ICD-10-CM

## 2014-07-01 ENCOUNTER — Other Ambulatory Visit: Payer: Self-pay | Admitting: Gastroenterology

## 2014-07-06 ENCOUNTER — Other Ambulatory Visit: Payer: Self-pay | Admitting: Orthopedic Surgery

## 2014-07-06 DIAGNOSIS — Z6841 Body Mass Index (BMI) 40.0 and over, adult: Secondary | ICD-10-CM | POA: Diagnosis not present

## 2014-07-06 DIAGNOSIS — M4802 Spinal stenosis, cervical region: Secondary | ICD-10-CM | POA: Diagnosis not present

## 2014-07-06 DIAGNOSIS — M25511 Pain in right shoulder: Secondary | ICD-10-CM

## 2014-07-11 ENCOUNTER — Other Ambulatory Visit: Payer: Self-pay | Admitting: Cardiology

## 2014-07-13 ENCOUNTER — Other Ambulatory Visit: Payer: Self-pay

## 2014-07-13 MED ORDER — POTASSIUM CHLORIDE CRYS ER 20 MEQ PO TBCR: 40.0000 meq | EXTENDED_RELEASE_TABLET | Freq: Two times a day (BID) | ORAL | Status: DC

## 2014-07-14 ENCOUNTER — Other Ambulatory Visit: Payer: Medicare Other

## 2014-07-15 ENCOUNTER — Other Ambulatory Visit: Payer: Self-pay | Admitting: Neurological Surgery

## 2014-07-15 DIAGNOSIS — M4802 Spinal stenosis, cervical region: Secondary | ICD-10-CM

## 2014-07-15 DIAGNOSIS — M25511 Pain in right shoulder: Secondary | ICD-10-CM

## 2014-07-16 ENCOUNTER — Other Ambulatory Visit: Payer: Medicare Other

## 2014-07-16 ENCOUNTER — Ambulatory Visit
Admission: RE | Admit: 2014-07-16 | Discharge: 2014-07-16 | Disposition: A | Discharge status: Home / Self Care | Payer: Medicare Other | Source: Ambulatory Visit | Attending: Orthopedic Surgery | Admitting: Orthopedic Surgery

## 2014-07-16 DIAGNOSIS — M4802 Spinal stenosis, cervical region: Secondary | ICD-10-CM

## 2014-07-16 DIAGNOSIS — M5033 Other cervical disc degeneration, cervicothoracic region: Secondary | ICD-10-CM | POA: Diagnosis not present

## 2014-07-16 DIAGNOSIS — M25511 Pain in right shoulder: Secondary | ICD-10-CM

## 2014-07-16 DIAGNOSIS — M5032 Other cervical disc degeneration, mid-cervical region: Secondary | ICD-10-CM | POA: Diagnosis not present

## 2014-07-16 DIAGNOSIS — M47812 Spondylosis without myelopathy or radiculopathy, cervical region: Secondary | ICD-10-CM | POA: Diagnosis not present

## 2014-07-16 DIAGNOSIS — M7581 Other shoulder lesions, right shoulder: Secondary | ICD-10-CM | POA: Diagnosis not present

## 2014-07-16 DIAGNOSIS — M19011 Primary osteoarthritis, right shoulder: Secondary | ICD-10-CM | POA: Diagnosis not present

## 2014-07-16 DIAGNOSIS — Z9884 Bariatric surgery status: Secondary | ICD-10-CM | POA: Diagnosis not present

## 2014-08-03 DIAGNOSIS — R972 Elevated prostate specific antigen [PSA]: Secondary | ICD-10-CM | POA: Diagnosis not present

## 2014-08-03 DIAGNOSIS — R3915 Urgency of urination: Secondary | ICD-10-CM | POA: Diagnosis not present

## 2014-08-03 DIAGNOSIS — R350 Frequency of micturition: Secondary | ICD-10-CM | POA: Diagnosis not present

## 2014-08-03 DIAGNOSIS — N528 Other male erectile dysfunction: Secondary | ICD-10-CM | POA: Diagnosis not present

## 2014-08-03 DIAGNOSIS — N3941 Urge incontinence: Secondary | ICD-10-CM | POA: Diagnosis not present

## 2014-08-03 DIAGNOSIS — N401 Enlarged prostate with lower urinary tract symptoms: Secondary | ICD-10-CM | POA: Diagnosis not present

## 2014-08-06 ENCOUNTER — Other Ambulatory Visit: Payer: Medicare Other

## 2014-08-10 DIAGNOSIS — M25511 Pain in right shoulder: Secondary | ICD-10-CM | POA: Diagnosis not present

## 2014-08-25 DIAGNOSIS — Z6841 Body Mass Index (BMI) 40.0 and over, adult: Secondary | ICD-10-CM | POA: Diagnosis not present

## 2014-08-25 DIAGNOSIS — M4802 Spinal stenosis, cervical region: Secondary | ICD-10-CM | POA: Diagnosis not present

## 2014-08-27 ENCOUNTER — Other Ambulatory Visit: Payer: Self-pay | Admitting: *Deleted

## 2014-08-27 DIAGNOSIS — I712 Thoracic aortic aneurysm, without rupture, unspecified: Secondary | ICD-10-CM

## 2014-09-11 DIAGNOSIS — Z86711 Personal history of pulmonary embolism: Secondary | ICD-10-CM | POA: Diagnosis not present

## 2014-09-11 DIAGNOSIS — Z87891 Personal history of nicotine dependence: Secondary | ICD-10-CM | POA: Diagnosis not present

## 2014-09-11 DIAGNOSIS — I10 Essential (primary) hypertension: Secondary | ICD-10-CM | POA: Diagnosis not present

## 2014-09-11 DIAGNOSIS — Z7901 Long term (current) use of anticoagulants: Secondary | ICD-10-CM | POA: Diagnosis not present

## 2014-09-11 DIAGNOSIS — Z01810 Encounter for preprocedural cardiovascular examination: Secondary | ICD-10-CM | POA: Diagnosis not present

## 2014-09-11 DIAGNOSIS — E669 Obesity, unspecified: Secondary | ICD-10-CM | POA: Diagnosis not present

## 2014-09-11 DIAGNOSIS — E119 Type 2 diabetes mellitus without complications: Secondary | ICD-10-CM | POA: Diagnosis not present

## 2014-09-11 DIAGNOSIS — I2510 Atherosclerotic heart disease of native coronary artery without angina pectoris: Secondary | ICD-10-CM | POA: Diagnosis not present

## 2014-09-11 DIAGNOSIS — Z6841 Body Mass Index (BMI) 40.0 and over, adult: Secondary | ICD-10-CM | POA: Diagnosis not present

## 2014-09-11 DIAGNOSIS — I712 Thoracic aortic aneurysm, without rupture: Secondary | ICD-10-CM | POA: Diagnosis not present

## 2014-09-11 DIAGNOSIS — G4733 Obstructive sleep apnea (adult) (pediatric): Secondary | ICD-10-CM | POA: Diagnosis not present

## 2014-09-11 DIAGNOSIS — I480 Paroxysmal atrial fibrillation: Secondary | ICD-10-CM | POA: Diagnosis not present

## 2014-09-11 DIAGNOSIS — J4520 Mild intermittent asthma, uncomplicated: Secondary | ICD-10-CM | POA: Diagnosis not present

## 2014-09-11 DIAGNOSIS — R972 Elevated prostate specific antigen [PSA]: Secondary | ICD-10-CM | POA: Diagnosis not present

## 2014-09-11 DIAGNOSIS — I493 Ventricular premature depolarization: Secondary | ICD-10-CM | POA: Diagnosis not present

## 2014-09-15 DIAGNOSIS — D225 Melanocytic nevi of trunk: Secondary | ICD-10-CM | POA: Diagnosis not present

## 2014-09-15 DIAGNOSIS — I872 Venous insufficiency (chronic) (peripheral): Secondary | ICD-10-CM | POA: Diagnosis not present

## 2014-09-15 DIAGNOSIS — L820 Inflamed seborrheic keratosis: Secondary | ICD-10-CM | POA: Diagnosis not present

## 2014-09-15 DIAGNOSIS — L304 Erythema intertrigo: Secondary | ICD-10-CM | POA: Diagnosis not present

## 2014-09-17 ENCOUNTER — Other Ambulatory Visit: Payer: Medicare Other

## 2014-09-17 ENCOUNTER — Encounter: Payer: Medicare Other | Admitting: Cardiothoracic Surgery

## 2014-09-22 DIAGNOSIS — Z6841 Body Mass Index (BMI) 40.0 and over, adult: Secondary | ICD-10-CM | POA: Diagnosis not present

## 2014-09-22 DIAGNOSIS — R972 Elevated prostate specific antigen [PSA]: Secondary | ICD-10-CM | POA: Diagnosis not present

## 2014-09-22 DIAGNOSIS — C61 Malignant neoplasm of prostate: Secondary | ICD-10-CM | POA: Diagnosis not present

## 2014-09-22 DIAGNOSIS — I10 Essential (primary) hypertension: Secondary | ICD-10-CM | POA: Diagnosis not present

## 2014-09-22 DIAGNOSIS — I2510 Atherosclerotic heart disease of native coronary artery without angina pectoris: Secondary | ICD-10-CM | POA: Diagnosis not present

## 2014-09-22 DIAGNOSIS — G4733 Obstructive sleep apnea (adult) (pediatric): Secondary | ICD-10-CM | POA: Diagnosis not present

## 2014-09-22 DIAGNOSIS — E669 Obesity, unspecified: Secondary | ICD-10-CM | POA: Diagnosis not present

## 2014-09-29 DIAGNOSIS — I712 Thoracic aortic aneurysm, without rupture: Secondary | ICD-10-CM | POA: Diagnosis not present

## 2014-09-29 LAB — CREATININE, SERUM: Creat: 1 mg/dL (ref 0.70–1.18)

## 2014-10-01 ENCOUNTER — Encounter: Payer: Self-pay | Admitting: Cardiothoracic Surgery

## 2014-10-01 ENCOUNTER — Ambulatory Visit
Admission: RE | Admit: 2014-10-01 | Discharge: 2014-10-01 | Disposition: A | Discharge status: Home / Self Care | Payer: Medicare Other | Source: Ambulatory Visit | Attending: Cardiothoracic Surgery | Admitting: Cardiothoracic Surgery

## 2014-10-01 ENCOUNTER — Ambulatory Visit (INDEPENDENT_AMBULATORY_CARE_PROVIDER_SITE_OTHER): Payer: Medicare Other | Admitting: Cardiology

## 2014-10-01 ENCOUNTER — Ambulatory Visit (INDEPENDENT_AMBULATORY_CARE_PROVIDER_SITE_OTHER): Payer: Medicare Other | Admitting: Cardiothoracic Surgery

## 2014-10-01 ENCOUNTER — Encounter: Payer: Self-pay | Admitting: Cardiology

## 2014-10-01 VITALS — BP 113/67 | HR 76 | Resp 20 | Ht 67.0 in | Wt 280.0 lb

## 2014-10-01 VITALS — BP 130/76 | HR 62 | Ht 67.0 in | Wt 281.1 lb

## 2014-10-01 DIAGNOSIS — I712 Thoracic aortic aneurysm, without rupture, unspecified: Secondary | ICD-10-CM

## 2014-10-01 DIAGNOSIS — Z7901 Long term (current) use of anticoagulants: Secondary | ICD-10-CM | POA: Diagnosis not present

## 2014-10-01 DIAGNOSIS — I711 Thoracic aortic aneurysm, ruptured: Secondary | ICD-10-CM | POA: Diagnosis not present

## 2014-10-01 DIAGNOSIS — I5032 Chronic diastolic (congestive) heart failure: Secondary | ICD-10-CM

## 2014-10-01 DIAGNOSIS — I2510 Atherosclerotic heart disease of native coronary artery without angina pectoris: Secondary | ICD-10-CM | POA: Diagnosis not present

## 2014-10-01 DIAGNOSIS — I10 Essential (primary) hypertension: Secondary | ICD-10-CM

## 2014-10-01 DIAGNOSIS — I480 Paroxysmal atrial fibrillation: Secondary | ICD-10-CM

## 2014-10-01 DIAGNOSIS — I2699 Other pulmonary embolism without acute cor pulmonale: Secondary | ICD-10-CM

## 2014-10-01 MED ORDER — NITROGLYCERIN 0.4 MG SL SUBL: 0.4000 mg | SUBLINGUAL_TABLET | SUBLINGUAL | Status: DC | PRN

## 2014-10-01 MED ORDER — IOPAMIDOL (ISOVUE-370) INJECTION 76%
75.0000 mL | Freq: Once | INTRAVENOUS | Status: AC | PRN
  Administered 2014-10-01: 75 mL via INTRAVENOUS

## 2014-10-01 NOTE — Patient Instructions (Signed)
Continue your current therapy  I will see you in 6 months.   

## 2014-10-01 NOTE — Progress Notes (Signed)
[address]       [place] [address]             [phone]                   Bronislaw C Tobey [place] Medical Record #[id number] Date of Birth: Jun 04, 1940  Referring: Martinique, [name], MD Primary Care: Henrine Screws, MD  Chief Complaint:    Chief Complaint  Patient presents with  . Thoracic Aortic Aneurysm    1 year f/u with CTA Chest    History of Present Illness:    Patient returns today for followup CT scan one year after previous scan for evaluation of dilated ascending  Aorta. He  has known CAD with  a stent was placed in his circumflex coronary artery in the past. He denies any angina at this time but does have some exertional shortness of breath. He has no angina as he had before coronary stent. The patient's respiratory status compromise is his overall physical activity. He was just seen by pulmonary several weeks ago for COPD exacerbation/pneumonia. He still has shortness of breath with activity , at times he gets short of breath laying down.  Since last seen the patient's been diagnosed with prostate cancer in his currently undergoing evaluation at [facility].    Current Activity/ Functional Status: Patient is independent with mobility/ambulation, transfers, ADL's, IADL's.   Past Medical History  Diagnosis Date  . Hypertension   . Obesities, morbid   . Pulmonary embolism     2008  . Anticoagulant long-term use   . CAD (coronary artery disease)     a. s/p BMS to 2nd OM in Sept 2012; b. LexiScan Myoview (12/2012):  Inf infarct; bowel and motion artifact make study difficult to interpret; no ischemia; not gated; Low Risk  . OA (osteoarthritis)   . LVH (left ventricular hypertrophy)   . Colonic polyp   . Mild intermittent asthma   . Hemorrhoids   . Generalized headaches   . SOB (shortness of breath)     using oxygen with exercise  . Diastolic dysfunction   . ASCVD (arteriosclerotic cardiovascular disease)     Prior BMS  to the 2nd OM in September 2012; with repeat cath in October showing patency  . GERD (gastroesophageal reflux disease)   . IBS (irritable bowel syndrome)   . Aortic root enlargement   .        Marland Kitchen PAF (paroxysmal atrial fibrillation)     treated with Coumadin  . Hearing loss   . Contact lens/glasses fitting   . OSA (obstructive sleep apnea)     PSG 03/30/97 AHI 21, BPAP 13/9  . Sleep apnea   . Prostate ca   . Thoracic aortic aneurysm     Aortic Size Index=     5.0    /Body surface area is 2.43 meters squared. = 2.05  < 2.75 cm/m2      4% risk per year 2.75 to 4.25          8% risk per year > 4.25 cm/m2    20% risk per year   Stable aneurysmal dilation of the ascending aorta with maximum AP diameter of 4.8 cm. Stable area of narrowing of the proximal most portion of the descending aorta measuring 2 cm., previously identified as an area of coarctation. No evidence of aortic dissection.  Coronary artery disease.  Normal appearance of the lungs.  Electronically Signed   By: Fidela Salisbury M.D.   On: 10/01/2014 08:50      Past Surgical History  Procedure Laterality Date  . Hemorroidectomy    . Vasectomy    . Cervical spine surgery  06/02/2010    lower back and neck  . Gastric bypass  05/27/2007  . Cholecystectomy    . Rotator cuff repair      both  . Achilles tendon repair    . Cardiac catheterization  2006  . Coronary stent placement  Sept 2012    2nd OM with BMS  . Cardiac catheterization  October 2012    Stent patent  . Appendectomy    . Eye surgery      bilateral cataract  . Laminectomy  05/30/2012    L 4 L5  . Left and right heart catheterization with coronary angiogram N/A 05/07/2014    Procedure: LEFT AND RIGHT HEART CATHETERIZATION WITH CORONARY ANGIOGRAM;  Surgeon: [name] Martinique, MD;  Location: [facility] CATH LAB;  Service: Cardiovascular;  Laterality: N/A;    Family History  Problem Relation Age of Onset  . Heart disease Mother   . Diabetes Mother   . Other Mother      stent placement  . Emphysema Father 17  . Heart attack Sister     Social History   Social History  . Marital Status: Married    Spouse Name: N/A  . Number of Children: 3  . Years of Education: N/A   Occupational History  . Retired from [employer]    Social History Main Topics  . Smoking status: Former Smoker -- 1.50 packs/day for 30 years    Quit date: 01/24/1992  . Smokeless tobacco: Never Used     Comment: Stopped in 1983  . Alcohol Use: No  . Drug Use: No  . Sexual Activity: Yes   Other Topics Concern  . Not on file   Social History Narrative    History  Smoking status  . Former Smoker -- 1.50 packs/day for 30 years  . Quit date: 01/24/1992  Smokeless tobacco  . Never Used    Comment: Stopped in 1983    History  Alcohol Use No     Allergies  Allergen Reactions  . Morphine Itching  . Adhesive [Tape] Itching and Rash    Where applied.    Current Outpatient Prescriptions  Medication Sig Dispense Refill  . acetaminophen (TYLENOL) 325 MG tablet Take 650 mg by mouth every 6 (six) hours as needed.    Marland Kitchen atenolol (TENORMIN) 25 MG tablet Take 25 mg by mouth daily.    Marland Kitchen atorvastatin (LIPITOR) 20 MG tablet Take 1 tablet (20 mg total) by mouth daily. 90 tablet 3  . Cholecalciferol (VITAMIN D-3) 5000 UNITS TABS Take 5,000 Units by mouth daily.    . finasteride (PROSCAR) 5 MG tablet Take 5 mg by mouth daily.    . fluticasone (FLONASE) 50 MCG/ACT nasal spray Place 2 sprays into the nose daily as needed for rhinitis.    . fluticasone (FLOVENT HFA) 110 MCG/ACT inhaler Inhale 2 puffs into the lungs 2 (two) times daily. 1 Inhaler 5  . furosemide (LASIX) 80 MG tablet Take 1 tablet (80 mg total) by mouth daily. Take 80 mg in the morning and 40 mg in the afternoon.    Marland Kitchen losartan (COZAAR) 100 MG tablet Take 50 mg by mouth daily.    . metFORMIN (GLUCOPHAGE) 500 MG tablet Take 1 tablet by mouth daily.    Marland Kitchen  montelukast (SINGULAIR) 10 MG tablet Take 10 mg by mouth at bedtime.    .  nitroGLYCERIN (NITROSTAT) 0.4 MG SL tablet Place 1 tablet (0.4 mg total) under the tongue every 5 (five) minutes as needed for chest pain. 25 tablet 6  . pantoprazole (PROTONIX) 40 MG tablet Take 40 mg by mouth daily.    . potassium chloride SA (KLOR-CON M20) 20 MEQ tablet Take 2 tablets (40 mEq total) by mouth 2 (two) times daily. 360 tablet 0  . PROAIR HFA 108 (90 BASE) MCG/ACT inhaler INHALE 2 PUFFS INTO LUNGS EVERY 6 HOURS AS NEEDED FOR WHEEZING 8.5 Inhaler 2  . rivaroxaban (XARELTO) 20 MG TABS tablet TAKE 1 TABLET BY MOUTH EVERY DAY 90 tablet 1  . spironolactone (ALDACTONE) 25 MG tablet Take 1 tab daily    . tamsulosin (FLOMAX) 0.4 MG CAPS Take 0.4 mg by mouth daily.    . traMADol (ULTRAM) 50 MG tablet Take 50 mg by mouth every 6 (six) hours as needed for pain.     No current facility-administered medications for this visit.       Review of Systems:     Cardiac Review of Systems: Y or N  Chest Pain [  n  ]  Resting SOB [n   ] Exertional SOB  Blue.Reese  ]  Orthopnea [ n ]   Pedal Edema [  n ]    Palpitations [n  ] Syncope  [ n]   Presyncope [ n  ]  General Review of Systems: [Y] = yes [  ]=no Constitional: recent weight change [ y]; anorexia [  ]; fatigue [ y ]; nausea [n  ]; night sweats [ n ]; fever [ n ]; or chills [n  ];                                                                                                                                          Dental: poor dentition[  ];   Eye : blurred vision [  ]; diplopia [   ]; vision changes [  ];  Amaurosis fugax[ n ]; Resp: cough [ n ];  wheezing[n  ];  hemoptysis[n  ]; shortness of breath[ y ]; paroxysmal nocturnal dyspnea[  ]; dyspnea on exertion[ y ]; or orthopnea[  ];  GI:  gallstones[  ], vomiting[  ];  dysphagia[  ]; melena[  ];  hematochezia [ n ]; heartburn[  ];   Hx of  Colonoscopy[ y ]; GU: kidney stones [  ]; hematuria[  ];   dysuria [  ];  nocturia[  ];  history of     obstruction [  ];             Skin: rash, swelling[  ];,  hair loss[  ];  peripheral edema[  ];  or itching[  ]; Musculosketetal: myalgias[  ];  joint swelling[  ];  joint erythema[  ];  joint pain[  ];  back pain[  ];  Heme/Lymph: bruising[  ];  bleeding[  ];  anemia[  ];  Neuro: TIA[  ];  headaches[  ];  stroke[  ];  vertigo[  ];  seizures[  ];   paresthesias[  ];  difficulty walking[n  ];  Psych:depression[  ]; anxiety[  ];  Endocrine: diabetes[  ];  thyroid dysfunction[  ];  Immunizations: Flu Blue.Reese  ]; Pneumococcal[ y];  Other:  Physical Exam: BP 113/67 mmHg  Pulse 76  Resp 20  Ht 5\' 7"  (1.702 m)  Wt 280 lb (127.007 kg)  BMI 43.84 kg/m2  SpO2 97%  General appearance: alert, cooperative, appears older than stated age and fatigued Neurologic: intact Heart: Irregular irregular pulse -afib, S1, S2 normal, no murmur, click, rub or gallop and normal apical impulse Lungs: clear to auscultation bilaterally and normal percussion bilaterally Abdomen: soft, non-tender; bowel sounds normal; no masses,  no organomegaly Extremities: Moderate bilateral edema at ankles currently    Diagnostic Studies & Laboratory data:     Recent Radiology Findings:  Ct Angio Chest Aorta W/cm &/or Wo/cm  10/01/2014   CLINICAL DATA:  Follow up thoracic aortic aneurysm. History of stent for blockage. Recently diagnosed prostate cancer.  EXAM: CT ANGIOGRAPHY CHEST WITH CONTRAST  TECHNIQUE: Multidetector CT imaging of the chest was performed using the standard protocol during bolus administration of intravenous contrast. Multiplanar CT image reconstructions and MIPs were obtained to evaluate the vascular anatomy.  CONTRAST:  75 cc Isovue 370.  COMPARISON:  09/11/2013  FINDINGS: There stable anulus mild dilation of the ascending aorta with maximum AP diameter of 4.8 cm. The diameter of the thoracic aorta at the arch is 4.4 cm. There is also a stable narrowing of the proximal descending aorta, downstream to the takeoff of the left subclavian artery, measuring 2 cm., as  previously identified as possible area of coarctation. The remaining of the descending thoracic aorta is torturous but normal in caliber. There is bovine arch anatomic variation.  There is no evidence of pulmonary embolus.  The heart is normal in size. Conventional coronary artery anatomy is seen with atherosclerotic disease involving mostly the left system. There is no evidence of significant focal lung parenchymal consolidation, pleural effusion or pneumothorax. No masses are identified ; no abnormal focal contrast enhancement is seen.  No mediastinal or axillary lymphadenopathy is seen. The visualized portions of the thyroid gland are unremarkable in appearance.  The visualized portions of the liver and spleen are unremarkable.  The visualized portions of the pancreas, stomach, adrenal glands and kidneys are within normal limits. There has been a prior cholecystectomy.  No acute osseous abnormalities are seen. Thoracic spine diffuse idiopathic skeletal hyperostosis is noted.  Review of the MIP images confirms the above findings.  IMPRESSION: Stable aneurysmal dilation of the ascending aorta with maximum AP diameter of 4.8 cm. Stable area of narrowing of the proximal most portion of the descending aorta measuring 2 cm., previously identified as an area of coarctation. No evidence of aortic dissection.  Coronary artery disease.  Normal appearance of the lungs.   Electronically Signed   By: Fidela Salisbury M.D.   On: 10/01/2014 08:50   Dg Chest 2 View  08/21/2013   CLINICAL DATA:  Followup of pneumonia. Productive cough. Congestion. Asthma. Ex-smoker.  EXAM: CHEST  2 VIEW  COMPARISON:  08/12/2013 and baseline radiograph of 05/21/2013  FINDINGS: Moderate thoracic spondylosis. Midline trachea. Moderate cardiomegaly. Pulmonary artery  enlargement. Mediastinal contours otherwise within normal limits. No pleural effusion or pneumothorax. Lower lobe predominant interstitial thickening. Clearing of right lower lobe  pneumonia.  IMPRESSION: Clearing of right lower lobe airspace disease/pneumonia.  Cardiomegaly with chronic interstitial thickening, likely related to smoking or chronic bronchitis.  Pulmonary artery enlargement suggests pulmonary arterial hypertension.   Electronically Signed   By: Abigail Miyamoto M.D.   On: 08/21/2013 09:47   Ct Angio Chest Aorta W/cm &/or Wo/cm  09/11/2013   CLINICAL DATA:  Thoracic aortic aneurysm.  EXAM: CT ANGIOGRAPHY CHEST WITH CONTRAST  TECHNIQUE: Multidetector CT imaging of the chest was performed using the standard protocol during bolus administration of intravenous contrast. Multiplanar CT image reconstructions and MIPs were obtained to evaluate the vascular anatomy.  CONTRAST:  56mL OMNIPAQUE IOHEXOL 350 MG/ML SOLN  COMPARISON:  01/06/2013 and 09/14/2011  FINDINGS: Lungs are adequately inflated without consolidation effusion. There is minimal linear scarring over the anterior right upper lobe unchanged. 4 mm nodular density adjacent the minor fissure unchanged from 2013. There is borderline cardiomegaly. Continued minimal calcification over the left main, lateral circumflex and anterior descending coronary arteries. No change in a right subcarinal lymph node measuring 1 cm by short axis.  There is continued evidence of aneurysmal dilatation of the ascending thoracic aorta which measures 4.8 cm in greatest AP diameter proximally which is unchanged. The thoracic aorta measures 4 cm in diameter at the level of the proximal arch without significant change. There is a relative area of narrowing of the posterior aortic arch 1.9 cm distal to the take-off of the left subclavian artery as the aorta measures 2.1 cm in diameter focally and increases in diameter to 3.2 cm immediately distal to this narrowing. Main pulmonary artery measures 4 cm in transverse diameter unchanged. Remaining mediastinal structures are unremarkable.  Images through the upper abdomen demonstrate evidence of prior  cholecystectomy as well as surgical change over the region of the gastroesophageal junction with lap band apparatus in place. There are degenerative changes of the spine.  Review of the MIP images confirms the above findings.  IMPRESSION: Stable aneurysmal dilatation of the ascending thoracic aorta measuring 4.8 cm in AP diameter. Note that there is a focal relative area of narrowing of the aortic distal to the take-off of the left subclavian artery measuring 2.1 cm in diameter likely representing a degree of coarctation unchanged. No evidence of dissection.  Stable borderline cardiomegaly.  Postsurgical changes as described.   Electronically Signed   By: Marin Olp M.D.   On: 09/11/2013 11:07    Recent Lab Findings: Lab Results  Component Value Date   WBC 6.6 04/28/2014   HGB 13.2 04/28/2014   HCT 38.4* 04/28/2014   PLT 168 04/28/2014   GLUCOSE 147* 04/28/2014   CHOL 118 01/07/2013   TRIG 133 01/07/2013   HDL 40 01/07/2013   LDLCALC 51 01/07/2013   ALT 13 01/06/2013   AST 18 01/06/2013   NA 138 04/28/2014   K 4.5 04/28/2014   CL 103 04/28/2014   CREATININE 1.00 09/29/2014   BUN 18 04/28/2014   CO2 23 04/28/2014   TSH 1.686 01/06/2013   INR 2.21* 04/28/2014   HGBA1C * 08/19/2006    6.7 (NOTE)   The ADA recommends the following therapeutic goals for glycemic   control related to Hgb A1C measurement:   Goal of Therapy:   < 7.0% Hgb A1C   Action Suggested:  > 8.0% Hgb A1C   Ref:  Diabetes Care, 22, Suppl.  1, 1999   Aortic Size Index=    4.8    /Body surface area is 2.45 meters squared. = 1.95  < 2.75 cm/m2      4% risk per year 2.75 to 4.25          8% risk per year > 4.25 cm/m2    20% risk per year   Assessment / Plan:    Stable appearance of the ascending aorta dilatation. Still 4.8 cm. With the patient's overall poor medical condition I would not recommend elective repair at this point. We'll plan to see the patient back in one year with a followup CTA of the chest  Grace Isaac MD  Beeper [phone] Office [phone] 10/01/2014 1:42 PM

## 2014-10-01 NOTE — Progress Notes (Signed)
Carlos Dawson Date of Birth: [birth year] Medical Record #[id number]  History of Present Illness: Carlos Dawson is seen today for follow up CAD and diastolic CHF. He has multiple medical issues including CAD, thoracic aneurysm, history of pulmonary emboli, morbid obesity with prior lap banding, HTN, chronic edema and PAF. He he is on chronic Xarelto. He underwent stenting of the obtuse marginal vessel in September of 2012.  Last CT of the chest this am showed stable 4.8 cm aneurysm. He was seen earlier this year with symptoms of increased dyspnea and chest pressure. Cardiac cath showed no significant CAD. LV function was good. LV filling pressures were high and he had mild pulmonary HTN. Lasix dose was increased.  On follow up today he reports his breathing is doing better. Still notes it when climbing stairs or carrying weight.  He is monitoring his salt intake more. Lost some weight on Adkin's diet but then put it back on. Recently diagnosed with Prostate CA Gleason stage 7. Still being worked up and treatment not yet decided.   Current Outpatient Prescriptions on File Prior to Visit  Medication Sig Dispense Refill  . acetaminophen (TYLENOL) 325 MG tablet Take 650 mg by mouth every 6 (six) hours as needed.    Marland Kitchen atenolol (TENORMIN) 25 MG tablet Take 25 mg by mouth daily.    Marland Kitchen atorvastatin (LIPITOR) 20 MG tablet Take 1 tablet (20 mg total) by mouth daily. 90 tablet 3  . Cholecalciferol (VITAMIN D-3) 5000 UNITS TABS Take 5,000 Units by mouth daily.    . finasteride (PROSCAR) 5 MG tablet Take 5 mg by mouth daily.    . fluticasone (FLONASE) 50 MCG/ACT nasal spray Place 2 sprays into the nose daily as needed for rhinitis.    . fluticasone (FLOVENT HFA) 110 MCG/ACT inhaler Inhale 2 puffs into the lungs 2 (two) times daily. 1 Inhaler 5  . furosemide (LASIX) 80 MG tablet Take 1 tablet (80 mg total) by mouth daily. Take 80 mg in the morning and 40 mg in the afternoon.    Marland Kitchen losartan (COZAAR) 100 MG tablet Take 50  mg by mouth daily.    . metFORMIN (GLUCOPHAGE) 500 MG tablet Take 1 tablet by mouth daily.    . montelukast (SINGULAIR) 10 MG tablet Take 10 mg by mouth at bedtime.    . pantoprazole (PROTONIX) 40 MG tablet Take 40 mg by mouth daily.    . potassium chloride SA (KLOR-CON M20) 20 MEQ tablet Take 2 tablets (40 mEq total) by mouth 2 (two) times daily. 360 tablet 0  . PROAIR HFA 108 (90 BASE) MCG/ACT inhaler INHALE 2 PUFFS INTO LUNGS EVERY 6 HOURS AS NEEDED FOR WHEEZING 8.5 Inhaler 2  . rivaroxaban (XARELTO) 20 MG TABS tablet TAKE 1 TABLET BY MOUTH EVERY DAY 90 tablet 1  . spironolactone (ALDACTONE) 25 MG tablet Take 1 tab daily    . tamsulosin (FLOMAX) 0.4 MG CAPS Take 0.4 mg by mouth daily.    . traMADol (ULTRAM) 50 MG tablet Take 50 mg by mouth every 6 (six) hours as needed for pain.    . hydrOXYzine (VISTARIL) 25 MG capsule Take 25 mg by mouth as needed for itching. As needed    . loratadine-pseudoephedrine (CLARITIN-D 12-HOUR) 5-120 MG per tablet Take 1 tablet by mouth 2 (two) times daily.     No current facility-administered medications on file prior to visit.    Allergies  Allergen Reactions  . Morphine Itching  . Adhesive [Tape] Itching and Rash  Where applied.    Past Medical History  Diagnosis Date  . Hypertension   . Obesities, morbid   . Pulmonary embolism     2008  . Anticoagulant long-term use   . CAD (coronary artery disease)     a. s/p BMS to 2nd OM in Sept 2012; b. LexiScan Myoview (12/2012):  Inf infarct; bowel and motion artifact make study difficult to interpret; no ischemia; not gated; Low Risk  . Thoracic aortic aneurysm     followed by Dr. Servando Snare; last CT November 11, 2010 and unchanged  . OA (osteoarthritis)   . LVH (left ventricular hypertrophy)   . Colonic polyp   . Mild intermittent asthma   . Hemorrhoids   . Generalized headaches   . SOB (shortness of breath)     using oxygen with exercise  . Diastolic dysfunction   . ASCVD (arteriosclerotic  cardiovascular disease)     Prior BMS to the 2nd OM in September 2012; with repeat cath in October showing patency  . GERD (gastroesophageal reflux disease)   . IBS (irritable bowel syndrome)   . Aortic root enlargement   . Ascending aortic aneurysm     recent scan in October 2012 showing no change; followed by Dr. Servando Snare  . PAF (paroxysmal atrial fibrillation)     treated with Coumadin  . Hearing loss   . Contact lens/glasses fitting   . OSA (obstructive sleep apnea)     PSG 03/30/97 AHI 21, BPAP 13/9  . Sleep apnea   . Prostate ca     Past Surgical History  Procedure Laterality Date  . Hemorroidectomy    . Vasectomy    . Cervical spine surgery  06/02/2010    lower back and neck  . Gastric bypass  05/27/2007  . Cholecystectomy    . Rotator cuff repair      both  . Achilles tendon repair    . Cardiac catheterization  2006  . Coronary stent placement  Sept 2012    2nd OM with BMS  . Cardiac catheterization  October 2012    Stent patent  . Appendectomy    . Eye surgery      bilateral cataract  . Laminectomy  05/30/2012    L 4 L5  . Left and right heart catheterization with coronary angiogram N/A 05/07/2014    Procedure: LEFT AND RIGHT HEART CATHETERIZATION WITH CORONARY ANGIOGRAM;  Surgeon: [name] Martinique, MD;  Location: [facility] CATH LAB;  Service: Cardiovascular;  Laterality: N/A;    History  Smoking status  . Former Smoker -- 1.50 packs/day for 30 years  . Quit date: 01/24/1992  Smokeless tobacco  . Never Used    Comment: Stopped in 1983    History  Alcohol Use No    Family History  Problem Relation Age of Onset  . Heart disease Mother   . Diabetes Mother   . Other Mother     stent placement  . Emphysema Father 24  . Heart attack Sister     Review of Systems: The review of systems is per the HPI. All other systems were reviewed and are negative.  Physical Exam: BP 130/76 mmHg  Pulse 62  Ht 5\' 7"  (1.702 m)  Wt 127.489 kg (281 lb 1 oz)  BMI 44.01 kg/m2    Patient is a pleasant obese white male no acute distress. He is morbidly obese. Skin is warm and dry. Color is normal.  HEENT is unremarkable. Normocephalic/atraumatic. PERRL. Sclera are nonicteric. Neck  is supple. No masses. No JVD. Lungs are clear. Cardiac exam shows a regular rate and rhythm. No gallop or murmur. Abdomen is obese but soft. Bowel sounds are positive. Extremities are full but with tr edema. Gait and ROM are intact. No gross neurologic deficits noted.   LABORATORY DATA:  Lab Results  Component Value Date   WBC 6.6 04/28/2014   HGB 13.2 04/28/2014   HCT 38.4* 04/28/2014   PLT 168 04/28/2014   GLUCOSE 147* 04/28/2014   CHOL 118 01/07/2013   TRIG 133 01/07/2013   HDL 40 01/07/2013   LDLCALC 51 01/07/2013   ALT 13 01/06/2013   AST 18 01/06/2013   NA 138 04/28/2014   K 4.5 04/28/2014   CL 103 04/28/2014   CREATININE 1.00 09/29/2014   BUN 18 04/28/2014   CO2 23 04/28/2014   TSH 1.686 01/06/2013   INR 2.21* 04/28/2014   HGBA1C * 08/19/2006    6.7 (NOTE)   The ADA recommends the following therapeutic goals for glycemic   control related to Hgb A1C measurement:   Goal of Therapy:   < 7.0% Hgb A1C   Action Suggested:  > 8.0% Hgb A1C   Ref:  Diabetes Care, 22, Suppl. 1, 1999   Lab Results  Component Value Date   CHOL 118 01/07/2013      Assessment / Plan:  1. CAD - prior bare-metal stent of the OM in 2012.  Myoview study in Dec 2014 was low risk. Recent Cath April 2016 showed no obstructive disease. Continue current therapy and risk factor modification.  2. Chronic Diastolic CHF -  Recent cath showed elevated LV filling pressures and mild pulmonary HTN. Lasix increased.  Continue lasix at current dose. Needs to restrict sodium. Encourage increased aerobic activity and weight loss.   3. HTN -  Controlled.  4. Morbid obesity with OSA on CPAP  5. Thoracic aneurysm -followed by Dr. Servando Snare annually.  CT today showed stable appearance.  6. PAF - now on Xarelto.  No sustained episodes.   7. HLD  8. Asthma.  9. Prostate CA

## 2014-10-02 DIAGNOSIS — C61 Malignant neoplasm of prostate: Secondary | ICD-10-CM | POA: Diagnosis not present

## 2014-10-02 DIAGNOSIS — R948 Abnormal results of function studies of other organs and systems: Secondary | ICD-10-CM | POA: Diagnosis not present

## 2014-10-05 ENCOUNTER — Other Ambulatory Visit: Payer: Self-pay | Admitting: Cardiology

## 2014-10-05 MED ORDER — POTASSIUM CHLORIDE CRYS ER 20 MEQ PO TBCR: 40.0000 meq | EXTENDED_RELEASE_TABLET | Freq: Two times a day (BID) | ORAL | Status: DC

## 2014-10-07 DIAGNOSIS — C61 Malignant neoplasm of prostate: Secondary | ICD-10-CM | POA: Diagnosis not present

## 2014-10-08 DIAGNOSIS — E119 Type 2 diabetes mellitus without complications: Secondary | ICD-10-CM | POA: Diagnosis not present

## 2014-10-08 DIAGNOSIS — R972 Elevated prostate specific antigen [PSA]: Secondary | ICD-10-CM | POA: Diagnosis not present

## 2014-10-08 DIAGNOSIS — Z86711 Personal history of pulmonary embolism: Secondary | ICD-10-CM | POA: Diagnosis not present

## 2014-10-08 DIAGNOSIS — I712 Thoracic aortic aneurysm, without rupture: Secondary | ICD-10-CM | POA: Diagnosis not present

## 2014-10-08 DIAGNOSIS — I10 Essential (primary) hypertension: Secondary | ICD-10-CM | POA: Diagnosis not present

## 2014-10-08 DIAGNOSIS — E669 Obesity, unspecified: Secondary | ICD-10-CM | POA: Diagnosis not present

## 2014-10-08 DIAGNOSIS — I2510 Atherosclerotic heart disease of native coronary artery without angina pectoris: Secondary | ICD-10-CM | POA: Diagnosis not present

## 2014-10-08 DIAGNOSIS — I480 Paroxysmal atrial fibrillation: Secondary | ICD-10-CM | POA: Diagnosis not present

## 2014-10-08 DIAGNOSIS — C61 Malignant neoplasm of prostate: Secondary | ICD-10-CM | POA: Diagnosis not present

## 2014-10-13 ENCOUNTER — Encounter (HOSPITAL_COMMUNITY): Payer: Self-pay | Admitting: *Deleted

## 2014-10-16 ENCOUNTER — Other Ambulatory Visit: Payer: Self-pay | Admitting: Gastroenterology

## 2014-10-26 ENCOUNTER — Ambulatory Visit (HOSPITAL_COMMUNITY): Admit: 2014-10-26 | Payer: Self-pay | Admitting: Gastroenterology

## 2014-10-26 ENCOUNTER — Ambulatory Visit (HOSPITAL_COMMUNITY): Admission: RE | Admit: 2014-10-26 | Payer: Medicare Other | Source: Ambulatory Visit | Admitting: Gastroenterology

## 2014-10-26 ENCOUNTER — Encounter (HOSPITAL_COMMUNITY): Payer: Self-pay

## 2014-10-26 DIAGNOSIS — R972 Elevated prostate specific antigen [PSA]: Secondary | ICD-10-CM | POA: Diagnosis not present

## 2014-10-26 DIAGNOSIS — C61 Malignant neoplasm of prostate: Secondary | ICD-10-CM | POA: Diagnosis not present

## 2014-10-26 HISTORY — DX: Heart failure, unspecified: I50.9

## 2014-10-26 SURGERY — COLONOSCOPY WITH PROPOFOL
Anesthesia: Monitor Anesthesia Care

## 2014-11-02 DIAGNOSIS — C61 Malignant neoplasm of prostate: Secondary | ICD-10-CM | POA: Diagnosis not present

## 2014-11-20 DIAGNOSIS — I2510 Atherosclerotic heart disease of native coronary artery without angina pectoris: Secondary | ICD-10-CM | POA: Diagnosis not present

## 2014-11-20 DIAGNOSIS — M7580 Other shoulder lesions, unspecified shoulder: Secondary | ICD-10-CM | POA: Diagnosis not present

## 2014-11-20 DIAGNOSIS — E785 Hyperlipidemia, unspecified: Secondary | ICD-10-CM | POA: Diagnosis not present

## 2014-11-20 DIAGNOSIS — R1032 Left lower quadrant pain: Secondary | ICD-10-CM | POA: Diagnosis not present

## 2014-11-20 DIAGNOSIS — E559 Vitamin D deficiency, unspecified: Secondary | ICD-10-CM | POA: Diagnosis not present

## 2014-11-20 DIAGNOSIS — E119 Type 2 diabetes mellitus without complications: Secondary | ICD-10-CM | POA: Diagnosis not present

## 2014-11-20 DIAGNOSIS — I10 Essential (primary) hypertension: Secondary | ICD-10-CM | POA: Diagnosis not present

## 2014-11-20 DIAGNOSIS — I509 Heart failure, unspecified: Secondary | ICD-10-CM | POA: Diagnosis not present

## 2014-11-23 DIAGNOSIS — Z6841 Body Mass Index (BMI) 40.0 and over, adult: Secondary | ICD-10-CM | POA: Diagnosis not present

## 2014-11-23 DIAGNOSIS — M4802 Spinal stenosis, cervical region: Secondary | ICD-10-CM | POA: Diagnosis not present

## 2014-11-23 DIAGNOSIS — Z23 Encounter for immunization: Secondary | ICD-10-CM | POA: Diagnosis not present

## 2014-11-24 ENCOUNTER — Ambulatory Visit: Payer: Medicare Other | Admitting: Pulmonary Disease

## 2014-11-26 ENCOUNTER — Telehealth: Payer: Self-pay | Admitting: Cardiology

## 2014-11-26 DIAGNOSIS — M25511 Pain in right shoulder: Secondary | ICD-10-CM | POA: Diagnosis not present

## 2014-11-26 NOTE — Telephone Encounter (Signed)
New Message    Pt calling stating that he needs to tell Malachy Mood that a surgical clearance form is coming to our office from [employer] and it needs to be filled out and sent back to them as soon as possible. Please call back and advise.

## 2014-11-26 NOTE — Telephone Encounter (Signed)
Spoke with pt, aware paperwork has not been received yet. He is needing shoulder surgery and the okay to hold his xarelto for the procedure. He is needing asap because he starts radiation treatment for prostate cancer at the end of the month. Will forward for dr jordan's review.

## 2014-11-26 NOTE — Telephone Encounter (Signed)
Returned call to patient.He is scheduled for right shoulder surgery 12/09/14.He needs to know if ok to hold xarelto and how many days to hold before surgery.Advised Dr.Jordan out of office this week, will send message to him for advice.

## 2014-11-30 NOTE — Telephone Encounter (Signed)
Returned call to patient Dr.Jordan advised cleared for shoulder surgery.Advised to hold Xarelto 48 hours prior to surgery.Copy of note faxed to [facility] at fax # [phone].

## 2014-11-30 NOTE — Telephone Encounter (Signed)
Carlos Dawson is cleared for shoulder surgery. Should hold Xarelto for 48 hours prior to surgery.  Valin Massie Martinique MD, [facility]

## 2014-12-01 NOTE — Telephone Encounter (Signed)
Received surgical clearance form from Dr.Caffrey's office.Form signed by Dr.Jordan and faxed back to fax # [phone].

## 2014-12-09 DIAGNOSIS — M19011 Primary osteoarthritis, right shoulder: Secondary | ICD-10-CM | POA: Diagnosis not present

## 2014-12-09 DIAGNOSIS — M75111 Incomplete rotator cuff tear or rupture of right shoulder, not specified as traumatic: Secondary | ICD-10-CM | POA: Diagnosis not present

## 2014-12-09 DIAGNOSIS — M7551 Bursitis of right shoulder: Secondary | ICD-10-CM | POA: Diagnosis not present

## 2014-12-09 DIAGNOSIS — M7541 Impingement syndrome of right shoulder: Secondary | ICD-10-CM | POA: Diagnosis not present

## 2014-12-09 DIAGNOSIS — G8918 Other acute postprocedural pain: Secondary | ICD-10-CM | POA: Diagnosis not present

## 2014-12-09 DIAGNOSIS — M24111 Other articular cartilage disorders, right shoulder: Secondary | ICD-10-CM | POA: Diagnosis not present

## 2014-12-14 DIAGNOSIS — M19011 Primary osteoarthritis, right shoulder: Secondary | ICD-10-CM | POA: Diagnosis not present

## 2014-12-14 DIAGNOSIS — M25511 Pain in right shoulder: Secondary | ICD-10-CM | POA: Diagnosis not present

## 2014-12-14 DIAGNOSIS — M25611 Stiffness of right shoulder, not elsewhere classified: Secondary | ICD-10-CM | POA: Diagnosis not present

## 2014-12-14 DIAGNOSIS — M6281 Muscle weakness (generalized): Secondary | ICD-10-CM | POA: Diagnosis not present

## 2014-12-16 DIAGNOSIS — M19011 Primary osteoarthritis, right shoulder: Secondary | ICD-10-CM | POA: Diagnosis not present

## 2014-12-16 DIAGNOSIS — M25511 Pain in right shoulder: Secondary | ICD-10-CM | POA: Diagnosis not present

## 2014-12-16 DIAGNOSIS — M6281 Muscle weakness (generalized): Secondary | ICD-10-CM | POA: Diagnosis not present

## 2014-12-16 DIAGNOSIS — C61 Malignant neoplasm of prostate: Secondary | ICD-10-CM | POA: Diagnosis not present

## 2014-12-16 DIAGNOSIS — M25611 Stiffness of right shoulder, not elsewhere classified: Secondary | ICD-10-CM | POA: Diagnosis not present

## 2014-12-21 DIAGNOSIS — M25511 Pain in right shoulder: Secondary | ICD-10-CM | POA: Diagnosis not present

## 2014-12-21 DIAGNOSIS — C61 Malignant neoplasm of prostate: Secondary | ICD-10-CM | POA: Diagnosis not present

## 2014-12-21 DIAGNOSIS — Z510 Encounter for antineoplastic radiation therapy: Secondary | ICD-10-CM | POA: Diagnosis not present

## 2014-12-21 DIAGNOSIS — M19011 Primary osteoarthritis, right shoulder: Secondary | ICD-10-CM | POA: Diagnosis not present

## 2014-12-22 DIAGNOSIS — C61 Malignant neoplasm of prostate: Secondary | ICD-10-CM | POA: Diagnosis not present

## 2014-12-22 DIAGNOSIS — Z510 Encounter for antineoplastic radiation therapy: Secondary | ICD-10-CM | POA: Diagnosis not present

## 2014-12-23 DIAGNOSIS — M19011 Primary osteoarthritis, right shoulder: Secondary | ICD-10-CM | POA: Diagnosis not present

## 2014-12-23 DIAGNOSIS — M25611 Stiffness of right shoulder, not elsewhere classified: Secondary | ICD-10-CM | POA: Diagnosis not present

## 2014-12-23 DIAGNOSIS — M6281 Muscle weakness (generalized): Secondary | ICD-10-CM | POA: Diagnosis not present

## 2014-12-23 DIAGNOSIS — M25511 Pain in right shoulder: Secondary | ICD-10-CM | POA: Diagnosis not present

## 2014-12-24 NOTE — Anesthesia Preprocedure Evaluation (Addendum)
Anesthesia Evaluation  Patient identified by MRN, date of birth, ID band Patient awake    Reviewed: Allergy & Precautions, NPO status , Patient's Chart, lab work & pertinent test results  Airway Mallampati: II       Dental  (+) Teeth Intact, Dental Advisory Given   Pulmonary sleep apnea , former smoker (quit 1994 45 pack year),    breath sounds clear to auscultation       Cardiovascular hypertension, Pt. on medications + CAD and +CHF  + Valvular Problems/Murmurs AS  Rhythm:Regular  CATH 04/2014 no significant blockage, STENT from 2012 patent, ECHO 04/4014 EF [id number], Grade 1 diastolic dys., HX PAF, Mild AS, Stable thoracic aneurysm     Neuro/Psych  Headaches,    GI/Hepatic GERD  Medicated,  Endo/Other  diabetes, Poorly Controlled, Oral Hypoglycemic Agents  Renal/GU      Musculoskeletal   Abdominal (+)  Abdomen: soft.    Peds  Hematology   Anesthesia Other Findings   Reproductive/Obstetrics                           Anesthesia Physical Anesthesia Plan  ASA: III  Anesthesia Plan: MAC   Post-op Pain Management:    Induction: Intravenous  Airway Management Planned: Nasal Cannula  Additional Equipment:   Intra-op Plan:   Post-operative Plan:   Informed Consent: I have reviewed the patients History and Physical, chart, labs and discussed the procedure including the risks, benefits and alternatives for the proposed anesthesia with the patient or authorized representative who has indicated his/her understanding and acceptance.     Plan Discussed with:   Anesthesia Plan Comments:         Anesthesia Quick Evaluation

## 2014-12-25 DIAGNOSIS — M25611 Stiffness of right shoulder, not elsewhere classified: Secondary | ICD-10-CM | POA: Diagnosis not present

## 2014-12-25 DIAGNOSIS — M25511 Pain in right shoulder: Secondary | ICD-10-CM | POA: Diagnosis not present

## 2014-12-25 DIAGNOSIS — M19011 Primary osteoarthritis, right shoulder: Secondary | ICD-10-CM | POA: Diagnosis not present

## 2014-12-25 DIAGNOSIS — M6281 Muscle weakness (generalized): Secondary | ICD-10-CM | POA: Diagnosis not present

## 2014-12-28 DIAGNOSIS — C61 Malignant neoplasm of prostate: Secondary | ICD-10-CM | POA: Diagnosis not present

## 2014-12-28 DIAGNOSIS — N3289 Other specified disorders of bladder: Secondary | ICD-10-CM | POA: Diagnosis not present

## 2014-12-28 DIAGNOSIS — K6389 Other specified diseases of intestine: Secondary | ICD-10-CM | POA: Diagnosis not present

## 2014-12-28 DIAGNOSIS — Z510 Encounter for antineoplastic radiation therapy: Secondary | ICD-10-CM | POA: Diagnosis not present

## 2014-12-28 DIAGNOSIS — R300 Dysuria: Secondary | ICD-10-CM | POA: Diagnosis not present

## 2014-12-28 DIAGNOSIS — R5383 Other fatigue: Secondary | ICD-10-CM | POA: Diagnosis not present

## 2014-12-29 ENCOUNTER — Encounter (HOSPITAL_COMMUNITY)
Admission: RE | Disposition: A | Discharge status: Home / Self Care | Payer: Self-pay | Source: Ambulatory Visit | Attending: Gastroenterology

## 2014-12-29 ENCOUNTER — Ambulatory Visit (HOSPITAL_COMMUNITY)
Admission: RE | Admit: 2014-12-29 | Discharge: 2014-12-29 | Disposition: A | Discharge status: Home / Self Care | Payer: Medicare Other | Source: Ambulatory Visit | Attending: Gastroenterology | Admitting: Gastroenterology

## 2014-12-29 ENCOUNTER — Encounter (HOSPITAL_COMMUNITY): Payer: Self-pay

## 2014-12-29 ENCOUNTER — Ambulatory Visit (HOSPITAL_COMMUNITY): Payer: Medicare Other | Admitting: Anesthesiology

## 2014-12-29 DIAGNOSIS — I4891 Unspecified atrial fibrillation: Secondary | ICD-10-CM | POA: Insufficient documentation

## 2014-12-29 DIAGNOSIS — K219 Gastro-esophageal reflux disease without esophagitis: Secondary | ICD-10-CM | POA: Insufficient documentation

## 2014-12-29 DIAGNOSIS — Z510 Encounter for antineoplastic radiation therapy: Secondary | ICD-10-CM | POA: Diagnosis not present

## 2014-12-29 DIAGNOSIS — Z79899 Other long term (current) drug therapy: Secondary | ICD-10-CM | POA: Diagnosis not present

## 2014-12-29 DIAGNOSIS — C61 Malignant neoplasm of prostate: Secondary | ICD-10-CM | POA: Diagnosis not present

## 2014-12-29 DIAGNOSIS — N400 Enlarged prostate without lower urinary tract symptoms: Secondary | ICD-10-CM | POA: Insufficient documentation

## 2014-12-29 DIAGNOSIS — I509 Heart failure, unspecified: Secondary | ICD-10-CM | POA: Diagnosis not present

## 2014-12-29 DIAGNOSIS — Z1211 Encounter for screening for malignant neoplasm of colon: Secondary | ICD-10-CM | POA: Insufficient documentation

## 2014-12-29 DIAGNOSIS — I10 Essential (primary) hypertension: Secondary | ICD-10-CM | POA: Diagnosis not present

## 2014-12-29 DIAGNOSIS — G4733 Obstructive sleep apnea (adult) (pediatric): Secondary | ICD-10-CM | POA: Diagnosis not present

## 2014-12-29 DIAGNOSIS — J45909 Unspecified asthma, uncomplicated: Secondary | ICD-10-CM | POA: Diagnosis not present

## 2014-12-29 DIAGNOSIS — R5383 Other fatigue: Secondary | ICD-10-CM | POA: Diagnosis not present

## 2014-12-29 DIAGNOSIS — I2510 Atherosclerotic heart disease of native coronary artery without angina pectoris: Secondary | ICD-10-CM | POA: Diagnosis not present

## 2014-12-29 DIAGNOSIS — D124 Benign neoplasm of descending colon: Secondary | ICD-10-CM | POA: Diagnosis not present

## 2014-12-29 DIAGNOSIS — Z9884 Bariatric surgery status: Secondary | ICD-10-CM | POA: Insufficient documentation

## 2014-12-29 DIAGNOSIS — Z86010 Personal history of colonic polyps: Secondary | ICD-10-CM | POA: Diagnosis not present

## 2014-12-29 DIAGNOSIS — Z86711 Personal history of pulmonary embolism: Secondary | ICD-10-CM | POA: Insufficient documentation

## 2014-12-29 DIAGNOSIS — K635 Polyp of colon: Secondary | ICD-10-CM | POA: Diagnosis not present

## 2014-12-29 DIAGNOSIS — K6389 Other specified diseases of intestine: Secondary | ICD-10-CM | POA: Diagnosis not present

## 2014-12-29 DIAGNOSIS — I719 Aortic aneurysm of unspecified site, without rupture: Secondary | ICD-10-CM | POA: Diagnosis not present

## 2014-12-29 DIAGNOSIS — D122 Benign neoplasm of ascending colon: Secondary | ICD-10-CM | POA: Diagnosis not present

## 2014-12-29 DIAGNOSIS — R300 Dysuria: Secondary | ICD-10-CM | POA: Diagnosis not present

## 2014-12-29 DIAGNOSIS — F410 Panic disorder [episodic paroxysmal anxiety] without agoraphobia: Secondary | ICD-10-CM | POA: Insufficient documentation

## 2014-12-29 DIAGNOSIS — Z7901 Long term (current) use of anticoagulants: Secondary | ICD-10-CM | POA: Diagnosis not present

## 2014-12-29 DIAGNOSIS — N3289 Other specified disorders of bladder: Secondary | ICD-10-CM | POA: Diagnosis not present

## 2014-12-29 HISTORY — PX: COLONOSCOPY WITH PROPOFOL: SHX5780

## 2014-12-29 LAB — GLUCOSE, CAPILLARY: Glucose-Capillary: 116 mg/dL — ABNORMAL HIGH (ref 65–99)

## 2014-12-29 SURGERY — COLONOSCOPY WITH PROPOFOL
Anesthesia: Monitor Anesthesia Care

## 2014-12-29 MED ORDER — MEPERIDINE HCL 100 MG/ML IJ SOLN: 6.2500 mg | INTRAMUSCULAR | Status: DC | PRN

## 2014-12-29 MED ORDER — PHENYLEPHRINE HCL 10 MG/ML IJ SOLN
INTRAMUSCULAR | Status: DC | PRN
  Administered 2014-12-29: 80 ug via INTRAVENOUS

## 2014-12-29 MED ORDER — EPHEDRINE SULFATE 50 MG/ML IJ SOLN
INTRAMUSCULAR | Status: DC | PRN
  Administered 2014-12-29 (×2): 10 mg via INTRAVENOUS

## 2014-12-29 MED ORDER — LACTATED RINGERS IV SOLN
INTRAVENOUS | Status: DC
  Administered 2014-12-29: 10:00:00 via INTRAVENOUS

## 2014-12-29 MED ORDER — PROMETHAZINE HCL 25 MG/ML IJ SOLN: 6.2500 mg | INTRAMUSCULAR | Status: DC | PRN

## 2014-12-29 MED ORDER — FENTANYL CITRATE (PF) 100 MCG/2ML IJ SOLN: 25.0000 ug | INTRAMUSCULAR | Status: DC | PRN

## 2014-12-29 MED ORDER — PROPOFOL 500 MG/50ML IV EMUL
INTRAVENOUS | Status: DC | PRN
  Administered 2014-12-29: 125 ug/kg/min via INTRAVENOUS

## 2014-12-29 MED ORDER — SODIUM CHLORIDE 0.9 % IV SOLN: INTRAVENOUS | Status: DC

## 2014-12-29 MED ORDER — PROPOFOL 500 MG/50ML IV EMUL
INTRAVENOUS | Status: DC | PRN
Start: 2014-12-29 — End: 2014-12-29
  Administered 2014-12-29: 30 mg via INTRAVENOUS

## 2014-12-29 SURGICAL SUPPLY — 21 items

## 2014-12-29 NOTE — Discharge Instructions (Signed)
Colonoscopy, Care After These instructions give you information on caring for yourself after your procedure. Your doctor may also give you more specific instructions. Call your doctor if you have any problems or questions after your procedure. HOME CARE  Do not drive for 24 hours.  Do not sign important papers or use machinery for 24 hours.  You may shower.  You may go back to your usual activities, but go slower for the first 24 hours.  Take rest breaks often during the first 24 hours.  Walk around or use warm packs on your belly (abdomen) if you have belly cramping or gas.  Drink enough fluids to keep your pee (urine) clear or pale yellow.  Resume your normal diet. Avoid heavy or fried foods.  Avoid drinking alcohol for 24 hours or as told by your doctor.  Only take medicines as told by your doctor. If a tissue sample (biopsy) was taken during the procedure:   Do not take aspirin or blood thinners for 7 days, or as told by your doctor.Restart your blood thinner today  Eat soft foods for the first 24 hours. GET HELP IF: You still have a small amount of blood in your poop (stool) 2-3 days after the procedure. GET HELP RIGHT AWAY IF:  You have more than a small amount of blood in your poop.  You see clumps of tissue (blood clots) in your poop.  Your belly is puffy (swollen).  You feel sick to your stomach (nauseous) or throw up (vomit).  You have a fever.  You have belly pain that gets worse and medicine does not help. MAKE SURE YOU:  Understand these instructions.  Will watch your condition.  Will get help right away if you are not doing well or get worse.   This information is not intended to replace advice given to you by your health care provider. Make sure you discuss any questions you have with your health care provider.   Document Released: 02/11/2010 Document Revised: 01/14/2013 Document Reviewed: 09/16/2012 Elsevier Interactive Patient Education [employer]

## 2014-12-29 NOTE — H&P (Signed)
  Procedure: Surveillance colonoscopy. 03/17/2014 ciprofloxacin and metronidazole prescribed for left lower quadrant abdominal pain. 08/09/2011 normal surveillance colonoscopy performed but colon prep was poor. History of adenomatous colon polyps removed colonoscopically in the past  History: The patient is a 74 year old male born 04-Apr-1940. He is scheduled to undergo a surveillance colonoscopy today. He stopped taking xarelto 5 days ago.  Past medical history: Hypertension. Congestive heart failure. Gastroesophageal reflux. Obstructive sleep apnea syndrome. Seasonal asthma. Benign prostatic hypertrophy with elevated PSA. Non-obstructive coronary artery disease. Panic disorder. History of bilateral pulmonary embolism. Atrial fibrillation. Cervical disc disease. Ascending aortic aneurysm. Bilateral cataracts area benign positional vertigo. Pulmonary hypertension. Lap band surgery. Cholecystectomy. Uvulectomy. Multiple back surgeries. Achilles tendon surgery. Right shoulder surgery. Bilateral elbow surgery. Bilateral rotator cuff surgery.  Medication allergies: Morphine. ACE inhibitors. Codeine.  Exam: The patient is alert and lying comfortably on the endoscopy stretcher. Abdomen is soft and nontender to palpation. Lungs are clear to auscultation. Cardiac exam reveals a regular rhythm. Patient has paroxysmal atrial fibrillation.  Plan: Proceed with surveillance colonoscopy

## 2014-12-29 NOTE — Anesthesia Postprocedure Evaluation (Signed)
Anesthesia Post Note  Patient: Carlos Dawson  Procedure(s) Performed: Procedure(s) (LRB): COLONOSCOPY WITH PROPOFOL (N/A)  Patient location during evaluation: PACU Anesthesia Type: MAC Level of consciousness: awake and alert Pain management: pain level controlled Vital Signs Assessment: post-procedure vital signs reviewed and stable Respiratory status: spontaneous breathing, nonlabored ventilation, respiratory function stable and patient connected to nasal cannula oxygen Cardiovascular status: stable and blood pressure returned to baseline Anesthetic complications: no    Last Vitals:  Filed Vitals:   12/29/14 0951 12/29/14 1111  BP: 103/69   Pulse: 65 68  Temp: 37.1 C   Resp: 14 16    Last Pain:  Filed Vitals:   12/29/14 1112  PainSc: 3                  [name]

## 2014-12-29 NOTE — Op Note (Signed)
Procedure: Surveillance colonoscopy. Adenomatous colon polyps removed colonoscopically in the past  Endoscopist: Earle Gell  Premedication: Propofol administered by anesthesia  Procedure: The patient was placed in the left lateral decubitus position. Anal inspection and digital rectal exam were normal. The patient was recently diagnosed with prostate cancer and has started external beam radiation therapy to the prostate. The Pentax pediatric colonoscope was introduced into the rectum and advanced to the cecum. A normal-appearing appendiceal orifice and ileocecal valve were identified. Colonic preparation for the exam today was good. Withdrawal time was 31 minutes  Rectum. Normal. Retroflexed view of the distal rectum was normal  Sigmoid colon. Normal  Descending colon. Two 5 mm sessile polyps were removed with the cold snare. Endoclips were applied to the polypectomy site to prevent bleeding when the patient resumes taking his anticoagulant therapy.  Splenic flexure. Normal  Transverse colon. Normal  Hepatic flexure. Normal  Ascending colon. Two 5 mm sessile polyps were removed with the cold snare and endoclips applied to the polypectomy site to prevent bleeding when the patient resumes taking his anticoagulant therapy. A 6 mm sessile polyp was removed with the electrocautery snare and an Endo Clip applied to the polypectomy site to prevent bleeding. This polyp was not retrieved for pathological evaluation.  Cecum and ileocecal valve. Normal  Assessment: Two small polyps were removed from the descending colon and three small polyps were removed from the ascending colon. One polyp was not retrieved for pathological evaluation.  Recommendation: The patient will probably not require a repeat surveillance colonoscopy in the future.

## 2014-12-29 NOTE — Transfer of Care (Signed)
Immediate Anesthesia Transfer of Care Note  Patient: Carlos Dawson  Procedure(s) Performed: Procedure(s): COLONOSCOPY WITH PROPOFOL (N/A)  Patient Location: PACU  Anesthesia Type:MAC  Level of Consciousness: awake, alert  and oriented  Airway & Oxygen Therapy: Patient Spontanous Breathing and Patient connected to face mask oxygen  Post-op Assessment: Report given to RN and Post -op Vital signs reviewed and stable  Post vital signs: Reviewed and stable  Last Vitals:  Filed Vitals:   12/29/14 0951  BP: 103/69  Pulse: 65  Temp: 37.1 C  Resp: 14    Complications: No apparent anesthesia complications

## 2014-12-30 ENCOUNTER — Encounter (HOSPITAL_COMMUNITY): Payer: Self-pay | Admitting: Gastroenterology

## 2014-12-30 DIAGNOSIS — M19011 Primary osteoarthritis, right shoulder: Secondary | ICD-10-CM | POA: Diagnosis not present

## 2014-12-30 DIAGNOSIS — N3289 Other specified disorders of bladder: Secondary | ICD-10-CM | POA: Diagnosis not present

## 2014-12-30 DIAGNOSIS — M6281 Muscle weakness (generalized): Secondary | ICD-10-CM | POA: Diagnosis not present

## 2014-12-30 DIAGNOSIS — K6389 Other specified diseases of intestine: Secondary | ICD-10-CM | POA: Diagnosis not present

## 2014-12-30 DIAGNOSIS — R5383 Other fatigue: Secondary | ICD-10-CM | POA: Diagnosis not present

## 2014-12-30 DIAGNOSIS — C61 Malignant neoplasm of prostate: Secondary | ICD-10-CM | POA: Diagnosis not present

## 2014-12-30 DIAGNOSIS — M25611 Stiffness of right shoulder, not elsewhere classified: Secondary | ICD-10-CM | POA: Diagnosis not present

## 2014-12-30 DIAGNOSIS — M25511 Pain in right shoulder: Secondary | ICD-10-CM | POA: Diagnosis not present

## 2014-12-30 DIAGNOSIS — R300 Dysuria: Secondary | ICD-10-CM | POA: Diagnosis not present

## 2014-12-30 DIAGNOSIS — Z510 Encounter for antineoplastic radiation therapy: Secondary | ICD-10-CM | POA: Diagnosis not present

## 2014-12-31 DIAGNOSIS — K6389 Other specified diseases of intestine: Secondary | ICD-10-CM | POA: Diagnosis not present

## 2014-12-31 DIAGNOSIS — R300 Dysuria: Secondary | ICD-10-CM | POA: Diagnosis not present

## 2014-12-31 DIAGNOSIS — Z510 Encounter for antineoplastic radiation therapy: Secondary | ICD-10-CM | POA: Diagnosis not present

## 2014-12-31 DIAGNOSIS — R5383 Other fatigue: Secondary | ICD-10-CM | POA: Diagnosis not present

## 2014-12-31 DIAGNOSIS — N3289 Other specified disorders of bladder: Secondary | ICD-10-CM | POA: Diagnosis not present

## 2014-12-31 DIAGNOSIS — C61 Malignant neoplasm of prostate: Secondary | ICD-10-CM | POA: Diagnosis not present

## 2015-01-01 DIAGNOSIS — C61 Malignant neoplasm of prostate: Secondary | ICD-10-CM | POA: Diagnosis not present

## 2015-01-01 DIAGNOSIS — K6389 Other specified diseases of intestine: Secondary | ICD-10-CM | POA: Diagnosis not present

## 2015-01-01 DIAGNOSIS — N3289 Other specified disorders of bladder: Secondary | ICD-10-CM | POA: Diagnosis not present

## 2015-01-01 DIAGNOSIS — R300 Dysuria: Secondary | ICD-10-CM | POA: Diagnosis not present

## 2015-01-01 DIAGNOSIS — R5383 Other fatigue: Secondary | ICD-10-CM | POA: Diagnosis not present

## 2015-01-01 DIAGNOSIS — Z510 Encounter for antineoplastic radiation therapy: Secondary | ICD-10-CM | POA: Diagnosis not present

## 2015-01-04 DIAGNOSIS — M25511 Pain in right shoulder: Secondary | ICD-10-CM | POA: Diagnosis not present

## 2015-01-04 DIAGNOSIS — M6281 Muscle weakness (generalized): Secondary | ICD-10-CM | POA: Diagnosis not present

## 2015-01-04 DIAGNOSIS — R300 Dysuria: Secondary | ICD-10-CM | POA: Diagnosis not present

## 2015-01-04 DIAGNOSIS — N3289 Other specified disorders of bladder: Secondary | ICD-10-CM | POA: Diagnosis not present

## 2015-01-04 DIAGNOSIS — M25611 Stiffness of right shoulder, not elsewhere classified: Secondary | ICD-10-CM | POA: Diagnosis not present

## 2015-01-04 DIAGNOSIS — C61 Malignant neoplasm of prostate: Secondary | ICD-10-CM | POA: Diagnosis not present

## 2015-01-04 DIAGNOSIS — Z510 Encounter for antineoplastic radiation therapy: Secondary | ICD-10-CM | POA: Diagnosis not present

## 2015-01-04 DIAGNOSIS — K6389 Other specified diseases of intestine: Secondary | ICD-10-CM | POA: Diagnosis not present

## 2015-01-04 DIAGNOSIS — R5383 Other fatigue: Secondary | ICD-10-CM | POA: Diagnosis not present

## 2015-01-04 DIAGNOSIS — M19011 Primary osteoarthritis, right shoulder: Secondary | ICD-10-CM | POA: Diagnosis not present

## 2015-01-05 ENCOUNTER — Other Ambulatory Visit: Payer: Self-pay | Admitting: Cardiology

## 2015-01-05 DIAGNOSIS — C61 Malignant neoplasm of prostate: Secondary | ICD-10-CM | POA: Diagnosis not present

## 2015-01-05 DIAGNOSIS — Z510 Encounter for antineoplastic radiation therapy: Secondary | ICD-10-CM | POA: Diagnosis not present

## 2015-01-05 DIAGNOSIS — M4802 Spinal stenosis, cervical region: Secondary | ICD-10-CM | POA: Diagnosis not present

## 2015-01-05 DIAGNOSIS — R300 Dysuria: Secondary | ICD-10-CM | POA: Diagnosis not present

## 2015-01-05 DIAGNOSIS — R5383 Other fatigue: Secondary | ICD-10-CM | POA: Diagnosis not present

## 2015-01-05 DIAGNOSIS — N3289 Other specified disorders of bladder: Secondary | ICD-10-CM | POA: Diagnosis not present

## 2015-01-05 DIAGNOSIS — M4804 Spinal stenosis, thoracic region: Secondary | ICD-10-CM | POA: Diagnosis not present

## 2015-01-05 DIAGNOSIS — K6389 Other specified diseases of intestine: Secondary | ICD-10-CM | POA: Diagnosis not present

## 2015-01-06 DIAGNOSIS — Z510 Encounter for antineoplastic radiation therapy: Secondary | ICD-10-CM | POA: Diagnosis not present

## 2015-01-06 DIAGNOSIS — N3289 Other specified disorders of bladder: Secondary | ICD-10-CM | POA: Diagnosis not present

## 2015-01-06 DIAGNOSIS — K6389 Other specified diseases of intestine: Secondary | ICD-10-CM | POA: Diagnosis not present

## 2015-01-06 DIAGNOSIS — R300 Dysuria: Secondary | ICD-10-CM | POA: Diagnosis not present

## 2015-01-06 DIAGNOSIS — C61 Malignant neoplasm of prostate: Secondary | ICD-10-CM | POA: Diagnosis not present

## 2015-01-06 DIAGNOSIS — R5383 Other fatigue: Secondary | ICD-10-CM | POA: Diagnosis not present

## 2015-01-06 NOTE — Telephone Encounter (Signed)
Rx has been sent to the pharmacy electronically. ° °

## 2015-01-07 DIAGNOSIS — C61 Malignant neoplasm of prostate: Secondary | ICD-10-CM | POA: Diagnosis not present

## 2015-01-07 DIAGNOSIS — K6389 Other specified diseases of intestine: Secondary | ICD-10-CM | POA: Diagnosis not present

## 2015-01-07 DIAGNOSIS — N3289 Other specified disorders of bladder: Secondary | ICD-10-CM | POA: Diagnosis not present

## 2015-01-07 DIAGNOSIS — Z510 Encounter for antineoplastic radiation therapy: Secondary | ICD-10-CM | POA: Diagnosis not present

## 2015-01-07 DIAGNOSIS — R300 Dysuria: Secondary | ICD-10-CM | POA: Diagnosis not present

## 2015-01-07 DIAGNOSIS — R5383 Other fatigue: Secondary | ICD-10-CM | POA: Diagnosis not present

## 2015-01-08 DIAGNOSIS — Z510 Encounter for antineoplastic radiation therapy: Secondary | ICD-10-CM | POA: Diagnosis not present

## 2015-01-08 DIAGNOSIS — R300 Dysuria: Secondary | ICD-10-CM | POA: Diagnosis not present

## 2015-01-08 DIAGNOSIS — R5383 Other fatigue: Secondary | ICD-10-CM | POA: Diagnosis not present

## 2015-01-08 DIAGNOSIS — C61 Malignant neoplasm of prostate: Secondary | ICD-10-CM | POA: Diagnosis not present

## 2015-01-08 DIAGNOSIS — N3289 Other specified disorders of bladder: Secondary | ICD-10-CM | POA: Diagnosis not present

## 2015-01-08 DIAGNOSIS — K6389 Other specified diseases of intestine: Secondary | ICD-10-CM | POA: Diagnosis not present

## 2015-01-11 DIAGNOSIS — R300 Dysuria: Secondary | ICD-10-CM | POA: Diagnosis not present

## 2015-01-11 DIAGNOSIS — M4802 Spinal stenosis, cervical region: Secondary | ICD-10-CM | POA: Diagnosis not present

## 2015-01-11 DIAGNOSIS — N3289 Other specified disorders of bladder: Secondary | ICD-10-CM | POA: Diagnosis not present

## 2015-01-11 DIAGNOSIS — Z510 Encounter for antineoplastic radiation therapy: Secondary | ICD-10-CM | POA: Diagnosis not present

## 2015-01-11 DIAGNOSIS — C61 Malignant neoplasm of prostate: Secondary | ICD-10-CM | POA: Diagnosis not present

## 2015-01-11 DIAGNOSIS — R5383 Other fatigue: Secondary | ICD-10-CM | POA: Diagnosis not present

## 2015-01-11 DIAGNOSIS — K6389 Other specified diseases of intestine: Secondary | ICD-10-CM | POA: Diagnosis not present

## 2015-01-12 DIAGNOSIS — Z510 Encounter for antineoplastic radiation therapy: Secondary | ICD-10-CM | POA: Diagnosis not present

## 2015-01-12 DIAGNOSIS — R300 Dysuria: Secondary | ICD-10-CM | POA: Diagnosis not present

## 2015-01-12 DIAGNOSIS — N3289 Other specified disorders of bladder: Secondary | ICD-10-CM | POA: Diagnosis not present

## 2015-01-12 DIAGNOSIS — K6389 Other specified diseases of intestine: Secondary | ICD-10-CM | POA: Diagnosis not present

## 2015-01-12 DIAGNOSIS — C61 Malignant neoplasm of prostate: Secondary | ICD-10-CM | POA: Diagnosis not present

## 2015-01-12 DIAGNOSIS — R5383 Other fatigue: Secondary | ICD-10-CM | POA: Diagnosis not present

## 2015-01-13 DIAGNOSIS — R300 Dysuria: Secondary | ICD-10-CM | POA: Diagnosis not present

## 2015-01-13 DIAGNOSIS — C61 Malignant neoplasm of prostate: Secondary | ICD-10-CM | POA: Diagnosis not present

## 2015-01-13 DIAGNOSIS — R5383 Other fatigue: Secondary | ICD-10-CM | POA: Diagnosis not present

## 2015-01-13 DIAGNOSIS — Z510 Encounter for antineoplastic radiation therapy: Secondary | ICD-10-CM | POA: Diagnosis not present

## 2015-01-13 DIAGNOSIS — N3289 Other specified disorders of bladder: Secondary | ICD-10-CM | POA: Diagnosis not present

## 2015-01-13 DIAGNOSIS — K6389 Other specified diseases of intestine: Secondary | ICD-10-CM | POA: Diagnosis not present

## 2015-01-14 DIAGNOSIS — M25611 Stiffness of right shoulder, not elsewhere classified: Secondary | ICD-10-CM | POA: Diagnosis not present

## 2015-01-14 DIAGNOSIS — M25511 Pain in right shoulder: Secondary | ICD-10-CM | POA: Diagnosis not present

## 2015-01-14 DIAGNOSIS — K6389 Other specified diseases of intestine: Secondary | ICD-10-CM | POA: Diagnosis not present

## 2015-01-14 DIAGNOSIS — M19011 Primary osteoarthritis, right shoulder: Secondary | ICD-10-CM | POA: Diagnosis not present

## 2015-01-14 DIAGNOSIS — M6281 Muscle weakness (generalized): Secondary | ICD-10-CM | POA: Diagnosis not present

## 2015-01-14 DIAGNOSIS — R300 Dysuria: Secondary | ICD-10-CM | POA: Diagnosis not present

## 2015-01-14 DIAGNOSIS — C61 Malignant neoplasm of prostate: Secondary | ICD-10-CM | POA: Diagnosis not present

## 2015-01-14 DIAGNOSIS — N3289 Other specified disorders of bladder: Secondary | ICD-10-CM | POA: Diagnosis not present

## 2015-01-14 DIAGNOSIS — R5383 Other fatigue: Secondary | ICD-10-CM | POA: Diagnosis not present

## 2015-01-14 DIAGNOSIS — Z510 Encounter for antineoplastic radiation therapy: Secondary | ICD-10-CM | POA: Diagnosis not present

## 2015-01-15 DIAGNOSIS — R300 Dysuria: Secondary | ICD-10-CM | POA: Diagnosis not present

## 2015-01-15 DIAGNOSIS — R5383 Other fatigue: Secondary | ICD-10-CM | POA: Diagnosis not present

## 2015-01-15 DIAGNOSIS — Z510 Encounter for antineoplastic radiation therapy: Secondary | ICD-10-CM | POA: Diagnosis not present

## 2015-01-15 DIAGNOSIS — K6389 Other specified diseases of intestine: Secondary | ICD-10-CM | POA: Diagnosis not present

## 2015-01-15 DIAGNOSIS — N3289 Other specified disorders of bladder: Secondary | ICD-10-CM | POA: Diagnosis not present

## 2015-01-15 DIAGNOSIS — C61 Malignant neoplasm of prostate: Secondary | ICD-10-CM | POA: Diagnosis not present

## 2015-01-19 DIAGNOSIS — N3289 Other specified disorders of bladder: Secondary | ICD-10-CM | POA: Diagnosis not present

## 2015-01-19 DIAGNOSIS — M19011 Primary osteoarthritis, right shoulder: Secondary | ICD-10-CM | POA: Diagnosis not present

## 2015-01-19 DIAGNOSIS — R300 Dysuria: Secondary | ICD-10-CM | POA: Diagnosis not present

## 2015-01-19 DIAGNOSIS — C61 Malignant neoplasm of prostate: Secondary | ICD-10-CM | POA: Diagnosis not present

## 2015-01-19 DIAGNOSIS — K6389 Other specified diseases of intestine: Secondary | ICD-10-CM | POA: Diagnosis not present

## 2015-01-19 DIAGNOSIS — R5383 Other fatigue: Secondary | ICD-10-CM | POA: Diagnosis not present

## 2015-01-19 DIAGNOSIS — Z510 Encounter for antineoplastic radiation therapy: Secondary | ICD-10-CM | POA: Diagnosis not present

## 2015-01-20 DIAGNOSIS — R5383 Other fatigue: Secondary | ICD-10-CM | POA: Diagnosis not present

## 2015-01-20 DIAGNOSIS — N3289 Other specified disorders of bladder: Secondary | ICD-10-CM | POA: Diagnosis not present

## 2015-01-20 DIAGNOSIS — C61 Malignant neoplasm of prostate: Secondary | ICD-10-CM | POA: Diagnosis not present

## 2015-01-20 DIAGNOSIS — K6389 Other specified diseases of intestine: Secondary | ICD-10-CM | POA: Diagnosis not present

## 2015-01-20 DIAGNOSIS — Z510 Encounter for antineoplastic radiation therapy: Secondary | ICD-10-CM | POA: Diagnosis not present

## 2015-01-20 DIAGNOSIS — R300 Dysuria: Secondary | ICD-10-CM | POA: Diagnosis not present

## 2015-01-21 DIAGNOSIS — R5383 Other fatigue: Secondary | ICD-10-CM | POA: Diagnosis not present

## 2015-01-21 DIAGNOSIS — C61 Malignant neoplasm of prostate: Secondary | ICD-10-CM | POA: Diagnosis not present

## 2015-01-21 DIAGNOSIS — Z510 Encounter for antineoplastic radiation therapy: Secondary | ICD-10-CM | POA: Diagnosis not present

## 2015-01-21 DIAGNOSIS — K6389 Other specified diseases of intestine: Secondary | ICD-10-CM | POA: Diagnosis not present

## 2015-01-21 DIAGNOSIS — R300 Dysuria: Secondary | ICD-10-CM | POA: Diagnosis not present

## 2015-01-21 DIAGNOSIS — N3289 Other specified disorders of bladder: Secondary | ICD-10-CM | POA: Diagnosis not present

## 2015-01-22 DIAGNOSIS — R300 Dysuria: Secondary | ICD-10-CM | POA: Diagnosis not present

## 2015-01-22 DIAGNOSIS — K6389 Other specified diseases of intestine: Secondary | ICD-10-CM | POA: Diagnosis not present

## 2015-01-22 DIAGNOSIS — R5383 Other fatigue: Secondary | ICD-10-CM | POA: Diagnosis not present

## 2015-01-22 DIAGNOSIS — Z510 Encounter for antineoplastic radiation therapy: Secondary | ICD-10-CM | POA: Diagnosis not present

## 2015-01-22 DIAGNOSIS — C61 Malignant neoplasm of prostate: Secondary | ICD-10-CM | POA: Diagnosis not present

## 2015-01-22 DIAGNOSIS — N3289 Other specified disorders of bladder: Secondary | ICD-10-CM | POA: Diagnosis not present

## 2015-01-26 DIAGNOSIS — Z510 Encounter for antineoplastic radiation therapy: Secondary | ICD-10-CM | POA: Diagnosis not present

## 2015-01-26 DIAGNOSIS — C61 Malignant neoplasm of prostate: Secondary | ICD-10-CM | POA: Diagnosis not present

## 2015-01-27 DIAGNOSIS — C61 Malignant neoplasm of prostate: Secondary | ICD-10-CM | POA: Diagnosis not present

## 2015-01-27 DIAGNOSIS — Z510 Encounter for antineoplastic radiation therapy: Secondary | ICD-10-CM | POA: Diagnosis not present

## 2015-01-28 DIAGNOSIS — C61 Malignant neoplasm of prostate: Secondary | ICD-10-CM | POA: Diagnosis not present

## 2015-01-28 DIAGNOSIS — Z510 Encounter for antineoplastic radiation therapy: Secondary | ICD-10-CM | POA: Diagnosis not present

## 2015-01-29 DIAGNOSIS — Z510 Encounter for antineoplastic radiation therapy: Secondary | ICD-10-CM | POA: Diagnosis not present

## 2015-01-29 DIAGNOSIS — C61 Malignant neoplasm of prostate: Secondary | ICD-10-CM | POA: Diagnosis not present

## 2015-02-01 DIAGNOSIS — M545 Low back pain: Secondary | ICD-10-CM | POA: Diagnosis not present

## 2015-02-01 DIAGNOSIS — M4804 Spinal stenosis, thoracic region: Secondary | ICD-10-CM | POA: Diagnosis not present

## 2015-02-01 DIAGNOSIS — M4802 Spinal stenosis, cervical region: Secondary | ICD-10-CM | POA: Diagnosis not present

## 2015-02-01 DIAGNOSIS — C61 Malignant neoplasm of prostate: Secondary | ICD-10-CM | POA: Diagnosis not present

## 2015-02-01 DIAGNOSIS — Z510 Encounter for antineoplastic radiation therapy: Secondary | ICD-10-CM | POA: Diagnosis not present

## 2015-02-02 DIAGNOSIS — Z510 Encounter for antineoplastic radiation therapy: Secondary | ICD-10-CM | POA: Diagnosis not present

## 2015-02-02 DIAGNOSIS — C61 Malignant neoplasm of prostate: Secondary | ICD-10-CM | POA: Diagnosis not present

## 2015-02-03 DIAGNOSIS — Z510 Encounter for antineoplastic radiation therapy: Secondary | ICD-10-CM | POA: Diagnosis not present

## 2015-02-03 DIAGNOSIS — C61 Malignant neoplasm of prostate: Secondary | ICD-10-CM | POA: Diagnosis not present

## 2015-02-04 DIAGNOSIS — C61 Malignant neoplasm of prostate: Secondary | ICD-10-CM | POA: Diagnosis not present

## 2015-02-04 DIAGNOSIS — Z510 Encounter for antineoplastic radiation therapy: Secondary | ICD-10-CM | POA: Diagnosis not present

## 2015-02-05 DIAGNOSIS — C61 Malignant neoplasm of prostate: Secondary | ICD-10-CM | POA: Diagnosis not present

## 2015-02-05 DIAGNOSIS — Z510 Encounter for antineoplastic radiation therapy: Secondary | ICD-10-CM | POA: Diagnosis not present

## 2015-02-09 DIAGNOSIS — Z510 Encounter for antineoplastic radiation therapy: Secondary | ICD-10-CM | POA: Diagnosis not present

## 2015-02-09 DIAGNOSIS — C61 Malignant neoplasm of prostate: Secondary | ICD-10-CM | POA: Diagnosis not present

## 2015-03-01 DIAGNOSIS — M4804 Spinal stenosis, thoracic region: Secondary | ICD-10-CM | POA: Diagnosis not present

## 2015-03-08 DIAGNOSIS — S8001XA Contusion of right knee, initial encounter: Secondary | ICD-10-CM | POA: Diagnosis not present

## 2015-03-08 DIAGNOSIS — S59801A Other specified injuries of right elbow, initial encounter: Secondary | ICD-10-CM | POA: Diagnosis not present

## 2015-03-08 DIAGNOSIS — S50812A Abrasion of left forearm, initial encounter: Secondary | ICD-10-CM | POA: Diagnosis not present

## 2015-03-08 DIAGNOSIS — S5000XA Contusion of unspecified elbow, initial encounter: Secondary | ICD-10-CM | POA: Diagnosis not present

## 2015-03-08 DIAGNOSIS — M25521 Pain in right elbow: Secondary | ICD-10-CM | POA: Diagnosis not present

## 2015-03-11 ENCOUNTER — Encounter: Payer: Self-pay | Admitting: Pulmonary Disease

## 2015-03-11 ENCOUNTER — Ambulatory Visit (INDEPENDENT_AMBULATORY_CARE_PROVIDER_SITE_OTHER): Payer: Medicare Other | Admitting: Pulmonary Disease

## 2015-03-11 VITALS — BP 112/84 | HR 71 | Ht 67.0 in | Wt 272.0 lb

## 2015-03-11 DIAGNOSIS — J309 Allergic rhinitis, unspecified: Secondary | ICD-10-CM

## 2015-03-11 DIAGNOSIS — J4530 Mild persistent asthma, uncomplicated: Secondary | ICD-10-CM | POA: Diagnosis not present

## 2015-03-11 DIAGNOSIS — G4733 Obstructive sleep apnea (adult) (pediatric): Secondary | ICD-10-CM | POA: Diagnosis not present

## 2015-03-11 NOTE — Progress Notes (Signed)
Current Outpatient Prescriptions on File Prior to Visit  Medication Sig  . acetaminophen (TYLENOL) 325 MG tablet Take 325-650 mg by mouth every 6 (six) hours as needed for headache.   Marland Kitchen atenolol (TENORMIN) 25 MG tablet Take 25 mg by mouth daily.  Marland Kitchen atorvastatin (LIPITOR) 20 MG tablet Take 1 tablet (20 mg total) by mouth daily. (Patient taking differently: Take 10 mg by mouth daily. )  . calcium carbonate (OS-CAL - DOSED IN MG OF ELEMENTAL CALCIUM) 1250 (500 CA) MG tablet Take 1 tablet by mouth daily with breakfast.  . Cholecalciferol (VITAMIN D-3) 5000 UNITS TABS Take 5,000 Units by mouth daily.  . clobetasol cream (TEMOVATE) 0.05 % APPLY TO AFFECTED AREA ON LEGS UP TO TWICE DAILY AS NEEDED *NOT TO FACE, GROIN OR UNDERARMS*  . fluticasone (FLOVENT HFA) 110 MCG/ACT inhaler Inhale 2 puffs into the lungs 2 (two) times daily. (Patient taking differently: Inhale 2 puffs into the lungs 2 (two) times daily as needed (shortness of breath.). )  . furosemide (LASIX) 80 MG tablet Take 1 tablet (80 mg total) by mouth daily. Take 80 mg in the morning and 40 mg in the afternoon.  Marland Kitchen ipratropium (ATROVENT) 0.03 % nasal spray Place 2 sprays into both nostrils every 12 (twelve) hours as needed for rhinitis.  Marland Kitchen KLOR-CON M20 20 MEQ tablet TAKE 2 TABLETS (40 MEQ TOTAL) BY MOUTH 2 (TWO) TIMES DAILY.  Marland Kitchen losartan (COZAAR) 100 MG tablet Take 50 mg by mouth at bedtime.   . metFORMIN (GLUCOPHAGE) 500 MG tablet Take 500 mg by mouth daily with breakfast.   . montelukast (SINGULAIR) 10 MG tablet Take 10 mg by mouth at bedtime.  . nitroGLYCERIN (NITROSTAT) 0.4 MG SL tablet Place 1 tablet (0.4 mg total) under the tongue every 5 (five) minutes as needed for chest pain.  . pantoprazole (PROTONIX) 40 MG tablet Take 40 mg by mouth daily.  Marland Kitchen PROAIR HFA 108 (90 BASE) MCG/ACT inhaler INHALE 2 PUFFS INTO LUNGS EVERY 6 HOURS AS NEEDED FOR WHEEZING  . rivaroxaban (XARELTO) 20 MG TABS tablet TAKE 1 TABLET BY MOUTH EVERY DAY  . sildenafil  (VIAGRA) 100 MG tablet Take 100 mg by mouth daily as needed for erectile dysfunction.  Marland Kitchen spironolactone (ALDACTONE) 25 MG tablet Take 25 mg by mouth daily.   . tamsulosin (FLOMAX) 0.4 MG CAPS Take 0.4 mg by mouth daily.  . traMADol (ULTRAM) 50 MG tablet Take 100 mg by mouth at bedtime as needed for moderate pain.    No current facility-administered medications on file prior to visit.     Chief Complaint  Patient presents with  . Follow-up    Wears CPAP nightly x 7-8 hours. Denies problems with mask or pressure.  Pt reports having vertigo this morning.     Tests PSG 03/30/97 >> AHI 21 ONO with BPAP and room air 12/12/10 >> Test time 6 hrs 56 min. Basal SpO2 93.1%, low SpO2 86%. Spent 2.8 min with SpO2 < 88%. PFT 12/20/10 >> FEV1 2.13(79%), FEV1% 71, TLC 5.80(100%), DLCO 57%, no BD CT chest 09/14/11 >> Stable dilatation of ascending thoracic aorta 4.7 cm, PA 4.1 cm PSG 10/21/13 >> AHI 40.1, SaO2 80%, PLMI 75.6, 7 beat VT CPAP 12/28/13 >> CPAP 10 cm H2O >> AHI 1.5, +R  CPAP 12/10/14 to 03/09/15 >> used on 88 of 90 nights with average 7 hrs and 39 min. Average AHI is 3.3 with CPAP 10 cm H2O.  Past medical hx HTN, diastolic dysfx, CAD s/p stent, PAF,  HLD, TAA, PE in 2008, HA, GERD, IBS, Obesity s/p gastric bypass in 2009, benign positional vertigo  Past surgical hx, Allergies, Family hx, Social hx all reviewed.  Vital Signs BP 112/84 mmHg  Pulse 71  Ht 5\' 7"  (1.702 m)  Wt 272 lb (123.378 kg)  BMI 42.59 kg/m2  SpO2 95%  History of Present Illness Carlos Dawson is a 75 y.o. male with OSA, and asthma.  He [place] while [place] the beach and roughed up his arm and leg.  He has noticed more problems with vertigo.  This happens intermittently for yrs.  He has exercises to do for BPV, but hasn't started them yet.  He is using CPAP w/o difficulty.  His breathing has been stable.  He is not having cough, wheeze, or sputum.  He gets discomfort in his Rt chest and sternum.  This is  intermittent and has been happening for at least a year.  He had CT chest in September 2016 that was unremarkable.    Physical Exam  General - No distress ENT - No sinus tenderness, no oral exudate, no LAN Cardiac - s1s2 regular, no murmur Chest - No wheeze/rales/dullness Back - No focal tenderness Abd - Soft, non-tender Ext - No edema, bandage on Lt arm Neuro - Normal strength Skin - No rashes Psych - normal mood, and behavior   Assessment/Plan  Obstructive sleep apnea. He is compliant with therapy and reports benefit from CPAP. Plan: - continue CPAP 10 cm H2O  Mild, persistent asthma. Plan: - continue flovent, singulair and prn albuterol   Patient Instructions  Follow up in 1 year     Chesley Mires, MD [place] Pager:  [phone]

## 2015-03-11 NOTE — Patient Instructions (Signed)
Follow up in 1 year.

## 2015-03-29 DIAGNOSIS — M4802 Spinal stenosis, cervical region: Secondary | ICD-10-CM | POA: Diagnosis not present

## 2015-03-29 DIAGNOSIS — M545 Low back pain: Secondary | ICD-10-CM | POA: Diagnosis not present

## 2015-03-29 DIAGNOSIS — Z6841 Body Mass Index (BMI) 40.0 and over, adult: Secondary | ICD-10-CM | POA: Diagnosis not present

## 2015-03-29 DIAGNOSIS — M4804 Spinal stenosis, thoracic region: Secondary | ICD-10-CM | POA: Diagnosis not present

## 2015-03-30 ENCOUNTER — Ambulatory Visit (INDEPENDENT_AMBULATORY_CARE_PROVIDER_SITE_OTHER): Payer: Medicare Other | Admitting: Cardiology

## 2015-03-30 ENCOUNTER — Encounter: Payer: Self-pay | Admitting: Cardiology

## 2015-03-30 VITALS — BP 100/60 | HR 58 | Wt 271.2 lb

## 2015-03-30 DIAGNOSIS — Z7901 Long term (current) use of anticoagulants: Secondary | ICD-10-CM | POA: Diagnosis not present

## 2015-03-30 DIAGNOSIS — I5032 Chronic diastolic (congestive) heart failure: Secondary | ICD-10-CM | POA: Diagnosis not present

## 2015-03-30 DIAGNOSIS — Z9884 Bariatric surgery status: Secondary | ICD-10-CM | POA: Diagnosis not present

## 2015-03-30 DIAGNOSIS — I2510 Atherosclerotic heart disease of native coronary artery without angina pectoris: Secondary | ICD-10-CM | POA: Diagnosis not present

## 2015-03-30 DIAGNOSIS — I712 Thoracic aortic aneurysm, without rupture, unspecified: Secondary | ICD-10-CM

## 2015-03-30 DIAGNOSIS — I10 Essential (primary) hypertension: Secondary | ICD-10-CM

## 2015-03-30 DIAGNOSIS — I480 Paroxysmal atrial fibrillation: Secondary | ICD-10-CM

## 2015-03-30 NOTE — Patient Instructions (Signed)
Continue your current therapy  I will see you in 6 months.   

## 2015-03-31 NOTE — Progress Notes (Signed)
Carlos Dawson Date of Birth: 08-26-1940 Medical Record [id number]  History of Present Illness: Carlos Dawson is seen today for follow up CAD and diastolic CHF. He has multiple medical issues including CAD, thoracic aneurysm, history of pulmonary emboli, morbid obesity with prior lap banding, HTN, chronic edema and PAF. He he is on chronic Xarelto. He underwent stenting of the obtuse marginal vessel in September of 2012.  Last CT of the chest September 2016 showed stable 4.8 cm aneurysm. This is followed by Dr. Servando Snare.   Cardiac in April 2016 cath showed no significant CAD. LV function was good. LV filling pressures were high and he had mild pulmonary HTN. Lasix dose was increased.  On follow up today he reports his breathing is doing OK. Still notes SOB  when climbing stairs or walking up incline.   He is monitoring his salt intake.  Since last visit was treated for  Prostate CA Gleason stage 7 with RT treatments x 29. He is still using CPAP and followed by Dr. Halford Chessman.   Current Outpatient Prescriptions on File Prior to Visit  Medication Sig Dispense Refill  . acetaminophen (TYLENOL) 325 MG tablet Take 325-650 mg by mouth every 6 (six) hours as needed for headache.     Marland Kitchen atenolol (TENORMIN) 25 MG tablet Take 25 mg by mouth daily.    Marland Kitchen atorvastatin (LIPITOR) 20 MG tablet Take 1 tablet (20 mg total) by mouth daily. (Patient taking differently: Take 10 mg by mouth daily. ) 90 tablet 3  . calcium carbonate (OS-CAL - DOSED IN MG OF ELEMENTAL CALCIUM) 1250 (500 CA) MG tablet Take 1 tablet by mouth daily with breakfast.    . Cholecalciferol (VITAMIN D-3) 5000 UNITS TABS Take 5,000 Units by mouth daily.    . clobetasol cream (TEMOVATE) 0.05 % APPLY TO AFFECTED AREA ON LEGS UP TO TWICE DAILY AS NEEDED *NOT TO FACE, GROIN OR UNDERARMS*  3  . fluticasone (FLOVENT HFA) 110 MCG/ACT inhaler Inhale 2 puffs into the lungs 2 (two) times daily. (Patient taking differently: Inhale 2 puffs into the lungs 2 (two) times daily  as needed (shortness of breath.). ) 1 Inhaler 5  . furosemide (LASIX) 80 MG tablet Take 1 tablet (80 mg total) by mouth daily. Take 80 mg in the morning and 40 mg in the afternoon.    Marland Kitchen ipratropium (ATROVENT) 0.03 % nasal spray Place 2 sprays into both nostrils every 12 (twelve) hours as needed for rhinitis.    Marland Kitchen KLOR-CON M20 20 MEQ tablet TAKE 2 TABLETS (40 MEQ TOTAL) BY MOUTH 2 (TWO) TIMES DAILY. 360 tablet 1  . losartan (COZAAR) 100 MG tablet Take 50 mg by mouth at bedtime.     . metFORMIN (GLUCOPHAGE) 500 MG tablet Take 500 mg by mouth daily with breakfast.     . montelukast (SINGULAIR) 10 MG tablet Take 10 mg by mouth at bedtime.    . nitroGLYCERIN (NITROSTAT) 0.4 MG SL tablet Place 1 tablet (0.4 mg total) under the tongue every 5 (five) minutes as needed for chest pain. 25 tablet 6  . pantoprazole (PROTONIX) 40 MG tablet Take 40 mg by mouth daily.    Marland Kitchen PROAIR HFA 108 (90 BASE) MCG/ACT inhaler INHALE 2 PUFFS INTO LUNGS EVERY 6 HOURS AS NEEDED FOR WHEEZING 8.5 Inhaler 2  . rivaroxaban (XARELTO) 20 MG TABS tablet TAKE 1 TABLET BY MOUTH EVERY DAY 90 tablet 1  . sildenafil (VIAGRA) 100 MG tablet Take 100 mg by mouth daily as needed for erectile  dysfunction.    Marland Kitchen spironolactone (ALDACTONE) 25 MG tablet Take 25 mg by mouth daily.     . tamsulosin (FLOMAX) 0.4 MG CAPS Take 0.4 mg by mouth daily.    . traMADol (ULTRAM) 50 MG tablet Take 100 mg by mouth at bedtime as needed for moderate pain.      No current facility-administered medications on file prior to visit.    Allergies  Allergen Reactions  . Morphine Itching  . Adhesive [Tape] Itching and Rash    Past Medical History  Diagnosis Date  . Hypertension   . Obesities, morbid ([place])   . Pulmonary embolism ([place])     2008  . Anticoagulant long-term use   . OA (osteoarthritis)   . LVH (left ventricular hypertrophy)   . Colonic polyp   . Mild intermittent asthma   . Hemorrhoids   . Diastolic dysfunction   . ASCVD (arteriosclerotic  cardiovascular disease)     Prior BMS to the 2nd OM in September 2012; with repeat cath in October showing patency  . GERD (gastroesophageal reflux disease)   . IBS (irritable bowel syndrome)   . Aortic root enlargement ([place])   . Ascending aortic aneurysm [facility])     recent scan in October 2012 showing no change; followed by Dr. Servando Snare  . PAF (paroxysmal atrial fibrillation) ([place])     treated with Coumadin  . Hearing loss   . Contact lens/glasses fitting   . OSA (obstructive sleep apnea)     PSG 03/30/97 AHI 21, BPAP 13/9  . Sleep apnea   . Thoracic aortic aneurysm (H[facility])     Aortic Size Index=     5.0    /Body surface area is 2.43 meters squared. = 2.05  < 2.75 cm/m2      4% risk per year 2.75 to 4.25          8% risk per year > 4.25 cm/m2    20% risk per year   Stable aneurysmal dilation of the ascending aorta with maximum AP diameter of 4.8 cm. Stable area of narrowing of the proximal most portion of the descending aorta measuring 2 cm., previously identified as an area of coarctation. No evidence of aortic dissection.  Coronary artery disease.  Normal appearance of the lungs.   Electronically Signed   By: Fidela Salisbury M.D.   On: 10/01/2014 08:50    . CAD (coronary artery disease)     a. s/p BMS to 2nd OM in Sept 2012; b. LexiScan Myoview (12/2012):  Inf infarct; bowel and motion artifact make study difficult to interpret; no ischemia; not gated; Low Risk  . CHF (congestive heart failure) ([place])     no recent issues 10/13/14  . SOB (shortness of breath)     on excertion  . Diabetes mellitus without complication (HCC)     metphormin, average 154  . Generalized headaches     neck stenosis  . Prostate CA [facility])     Oncologist  DR. Daralene Milch [facility] 09/24/14, undetermined tx     Past Surgical History  Procedure Laterality Date  . Hemorroidectomy    . Vasectomy    . Cervical spine surgery  06/02/2010    lower back and neck  . Cholecystectomy    . Rotator cuff repair      both  .  Achilles tendon repair    . Cardiac catheterization  2006  . Coronary stent placement  Sept 2012    2nd OM with BMS  .  Cardiac catheterization  October 2012    Stent patent  . Appendectomy    . Eye surgery      bilateral cataract  . Laminectomy  05/30/2012    L 4 L5  . Left and right heart catheterization with coronary angiogram N/A 05/07/2014    Procedure: LEFT AND RIGHT HEART CATHETERIZATION WITH CORONARY ANGIOGRAM;  Surgeon: Mekaila Tarnow M Martinique, MD;  Location: [facility] CATH LAB;  Service: Cardiovascular;  Laterality: N/A;  . Laparoscopic gastric banding    . Uvulopalatopharyngoplasty    . Colonoscopy with propofol N/A 12/29/2014    Procedure: COLONOSCOPY WITH PROPOFOL;  Surgeon: Garlan Fair, MD;  Location: [facility] ENDOSCOPY;  Service: Endoscopy;  Laterality: N/A;    History  Smoking status  . Former Smoker -- 1.50 packs/day for 30 years  . Quit date: 01/24/1992  Smokeless tobacco  . Never Used    Comment: Stopped in 1983    History  Alcohol Use No    Family History  Problem Relation Age of Onset  . Heart disease Mother   . Diabetes Mother   . Other Mother     stent placement  . Emphysema Father 14  . Heart attack Sister     Review of Systems: The review of systems is per the HPI. All other systems were reviewed and are negative.  Physical Exam: BP 100/60 mmHg  Pulse 58  Wt 123.016 kg (271 lb 3.2 oz)   Patient is a pleasant obese white male no acute distress. He is morbidly obese. Skin is warm and dry. Color is normal.  HEENT is unremarkable. Normocephalic/atraumatic. PERRL. Sclera are nonicteric. Neck is supple. No masses. No JVD. Lungs are clear. Cardiac exam shows a regular rate and rhythm. No gallop or murmur. Normal S1-2. Abdomen is obese but soft. Bowel sounds are positive. Extremities are full but with no edema. Gait and ROM are intact. No gross neurologic deficits noted.   LABORATORY DATA:  Lab Results  Component Value Date   WBC 6.6 04/28/2014   HGB 13.2  04/28/2014   HCT 38.4* 04/28/2014   PLT 168 04/28/2014   GLUCOSE 147* 04/28/2014   CHOL 118 01/07/2013   TRIG 133 01/07/2013   HDL 40 01/07/2013   LDLCALC 51 01/07/2013   ALT 13 01/06/2013   AST 18 01/06/2013   NA 138 04/28/2014   K 4.5 04/28/2014   CL 103 04/28/2014   CREATININE 1.00 09/29/2014   BUN 18 04/28/2014   CO2 23 04/28/2014   TSH 1.686 01/06/2013   INR 2.21* 04/28/2014   HGBA1C * 08/19/2006    6.7 (NOTE)   The ADA recommends the following therapeutic goals for glycemic   control related to Hgb A1C measurement:   Goal of Therapy:   < 7.0% Hgb A1C   Action Suggested:  > 8.0% Hgb A1C   Ref:  Diabetes Care, 22, Suppl. 1, 1999   Lab Results  Component Value Date   CHOL 118 01/07/2013    Ecg today shows NSR rate 58. PACs with pattern of bigeminy. Nonspecific TWA. I have personally reviewed and interpreted this study.   Assessment / Plan:  1. CAD - prior bare-metal stent of the OM in 2012.  Myoview study in Dec 2014 was low risk.  Cath April 2016 showed no obstructive disease. Continue current therapy and risk factor modification.  2. Chronic Diastolic CHF -  Prior cath showed elevated LV filling pressures and mild pulmonary HTN. Lasix increased.  Continue lasix at current dose. Needs  to restrict sodium. Encourage increased aerobic activity and weight loss.   3. HTN -  Well controlled.  4. Morbid obesity with OSA on CPAP  5. Thoracic aneurysm -followed by Dr. Servando Snare annually.  CT September 2016 showed stable appearance.  6. PAF - now on Xarelto. No sustained episodes. Frequent PACs noted on Ecg but asymptomatic.   7. HLD  8. Asthma.  9. Prostate CA s/p RT

## 2015-04-19 ENCOUNTER — Encounter: Payer: Self-pay | Admitting: Adult Health

## 2015-04-19 ENCOUNTER — Ambulatory Visit (INDEPENDENT_AMBULATORY_CARE_PROVIDER_SITE_OTHER): Payer: Medicare Other | Admitting: Adult Health

## 2015-04-19 VITALS — BP 100/74 | HR 58 | Temp 98.7°F | Ht 67.0 in | Wt 276.0 lb

## 2015-04-19 DIAGNOSIS — J4530 Mild persistent asthma, uncomplicated: Secondary | ICD-10-CM | POA: Diagnosis not present

## 2015-04-19 DIAGNOSIS — I2510 Atherosclerotic heart disease of native coronary artery without angina pectoris: Secondary | ICD-10-CM | POA: Diagnosis not present

## 2015-04-19 MED ORDER — PREDNISONE 10 MG PO TABS: ORAL_TABLET | ORAL | Status: DC

## 2015-04-19 MED ORDER — CEFDINIR 300 MG PO CAPS: 300.0000 mg | ORAL_CAPSULE | Freq: Two times a day (BID) | ORAL | Status: DC

## 2015-04-19 MED ORDER — LEVALBUTEROL HCL 0.63 MG/3ML IN NEBU
0.6300 mg | INHALATION_SOLUTION | Freq: Once | RESPIRATORY_TRACT | Status: AC
Start: 2015-04-19 — End: 2015-04-19
  Administered 2015-04-19: 0.63 mg via RESPIRATORY_TRACT

## 2015-04-19 NOTE — Progress Notes (Signed)
Reviewed and agree with assessment/plan.  Chesley Mires, MD [facility] Pulmonary/Critical Care 04/19/2015, 11:09 AM Pager:  [phone]

## 2015-04-19 NOTE — Patient Instructions (Signed)
Omnicef 300mg  Twice daily  For 7 days -take with food.  Mucinex DM Twice daily  As needed  Cough/congestion  Prednisone taper over next week.  Fluids and rest  Please contact office for sooner follow up if symptoms do not improve or worsen or seek emergency care  follow up Dr. Halford Chessman  As planned and As needed

## 2015-04-19 NOTE — Assessment & Plan Note (Addendum)
Flare with bronchitis  xopenex neb x 1   Plan  Omnicef 300mg  Twice daily  For 7 days -take with food.  Mucinex DM Twice daily  As needed  Cough/congestion  Prednisone taper over next week.  Fluids and rest  Please contact office for sooner follow up if symptoms do not improve or worsen or seek emergency care  follow up Dr. Halford Chessman  As planned and As needed

## 2015-04-19 NOTE — Progress Notes (Signed)
Subjective:    Patient ID: Carlos Dawson, male    DOB: Jun 17, 1940, 76 y.o.   MRN: [id number]  HPI 75 yo male with OSA and Asthma followed by Dr. Halford Chessman    TEST  Tests PSG 03/30/97 >> AHI 21 ONO with BPAP and room air 12/12/10 >> Test time 6 hrs 56 min. Basal SpO2 93.1%, low SpO2 86%. Spent 2.8 min with SpO2 < 88%. PFT 12/20/10 >> FEV1 2.13(79%), FEV1% 71, TLC 5.80(100%), DLCO 57%, no BD CT chest 09/14/11 >> Stable dilatation of ascending thoracic aorta 4.7 cm, PA 4.1 cm PSG 10/21/13 >> AHI 40.1, SaO2 80%, PLMI 75.6, 7 beat VT CPAP 12/28/13 >> CPAP 10 cm H2O >> AHI 1.5, +R  CPAP 12/10/14 to 03/09/15 >> used on 88 of 90 nights with average 7 hrs and 39 min. Average AHI is 3.3 with CPAP 10 cm H2O.   04/19/2015 Acute OV  Pt presents for an acute office visit. Complains of non prod cough, chest tightness/congestion, watery eyes and ear pain. SOB, wheezing, fever of 100.0, and sinus headaches x 1 week. Denies sinus drainage, nausea or vomiting, hemoptysis , or chest pain.  Remains on Flovent Twice daily  And Singular.  Wife is sick .     Past Medical History  Diagnosis Date  . Hypertension   . Obesities, morbid ([place])   . Pulmonary embolism ([place])     2008  . Anticoagulant long-term use   . OA (osteoarthritis)   . LVH (left ventricular hypertrophy)   . Colonic polyp   . Mild intermittent asthma   . Hemorrhoids   . Diastolic dysfunction   . ASCVD (arteriosclerotic cardiovascular disease)     Prior BMS to the 2nd OM in September 2012; with repeat cath in October showing patency  . GERD (gastroesophageal reflux disease)   . IBS (irritable bowel syndrome)   . Aortic root enlargement ([place])   . Ascending aortic aneurysm [facility])     recent scan in October 2012 showing no change; followed by Dr. Servando Snare  . PAF (paroxysmal atrial fibrillation) ([place])     treated with Coumadin  . Hearing loss   . Contact lens/glasses fitting   . OSA (obstructive sleep apnea)     PSG 03/30/97 AHI 21,  BPAP 13/9  . Sleep apnea   . Thoracic aortic aneurysm (HCC)     Aortic Size Index=     5.0    /Body surface area is 2.43 meters squared. = 2.05  < 2.75 cm/m2      4% risk per year 2.75 to 4.25          8% risk per year > 4.25 cm/m2    20% risk per year   Stable aneurysmal dilation of the ascending aorta with maximum AP diameter of 4.8 cm. Stable area of narrowing of the proximal most portion of the descending aorta measuring 2 cm., previously identified as an area of coarctation. No evidence of aortic dissection.  Coronary artery disease.  Normal appearance of the lungs.   Electronically Signed   By: Fidela Salisbury M.D.   On: 10/01/2014 08:50    . CAD (coronary artery disease)     a. s/p BMS to 2nd OM in Sept 2012; b. LexiScan Myoview (12/2012):  Inf infarct; bowel and motion artifact make study difficult to interpret; no ischemia; not gated; Low Risk  . CHF (congestive heart failure) ([place])     no recent issues 10/13/14  . SOB (shortness of breath)  on excertion  . Diabetes mellitus without complication (HCC)     metphormin, average 154  . Generalized headaches     neck stenosis  . Prostate CA [facility])     Oncologist  DR. Daralene Milch [facility] dx 09/24/14, undetermined tx    Current Outpatient Prescriptions on File Prior to Visit  Medication Sig Dispense Refill  . acetaminophen (TYLENOL) 325 MG tablet Take 325-650 mg by mouth every 6 (six) hours as needed for headache.     Marland Kitchen atenolol (TENORMIN) 25 MG tablet Take 25 mg by mouth daily.    Marland Kitchen atorvastatin (LIPITOR) 20 MG tablet Take 1 tablet (20 mg total) by mouth daily. (Patient taking differently: Take 10 mg by mouth daily. ) 90 tablet 3  . calcium carbonate (OS-CAL - DOSED IN MG OF ELEMENTAL CALCIUM) 1250 (500 CA) MG tablet Take 1 tablet by mouth daily with breakfast.    . celecoxib (CELEBREX) 200 MG capsule Take 200 mg by mouth 2 (two) times daily.    . Cholecalciferol (VITAMIN D-3) 5000 UNITS TABS Take 5,000 Units by mouth daily.    .  clobetasol cream (TEMOVATE) 0.05 % APPLY TO AFFECTED AREA ON LEGS UP TO TWICE DAILY AS NEEDED *NOT TO FACE, GROIN OR UNDERARMS*  3  . fluticasone (FLOVENT HFA) 110 MCG/ACT inhaler Inhale 2 puffs into the lungs 2 (two) times daily. (Patient taking differently: Inhale 2 puffs into the lungs 2 (two) times daily as needed (shortness of breath.). ) 1 Inhaler 5  . furosemide (LASIX) 80 MG tablet Take 1 tablet (80 mg total) by mouth daily. Take 80 mg in the morning and 40 mg in the afternoon.    Marland Kitchen ipratropium (ATROVENT) 0.03 % nasal spray Place 2 sprays into both nostrils every 12 (twelve) hours as needed for rhinitis.    Marland Kitchen KLOR-CON M20 20 MEQ tablet TAKE 2 TABLETS (40 MEQ TOTAL) BY MOUTH 2 (TWO) TIMES DAILY. 360 tablet 1  . losartan (COZAAR) 100 MG tablet Take 50 mg by mouth at bedtime.     . metFORMIN (GLUCOPHAGE) 500 MG tablet Take 500 mg by mouth daily with breakfast.     . montelukast (SINGULAIR) 10 MG tablet Take 10 mg by mouth at bedtime.    . nitroGLYCERIN (NITROSTAT) 0.4 MG SL tablet Place 1 tablet (0.4 mg total) under the tongue every 5 (five) minutes as needed for chest pain. 25 tablet 6  . pantoprazole (PROTONIX) 40 MG tablet Take 40 mg by mouth daily.    Marland Kitchen PROAIR HFA 108 (90 BASE) MCG/ACT inhaler INHALE 2 PUFFS INTO LUNGS EVERY 6 HOURS AS NEEDED FOR WHEEZING 8.5 Inhaler 2  . rivaroxaban (XARELTO) 20 MG TABS tablet TAKE 1 TABLET BY MOUTH EVERY DAY 90 tablet 1  . sildenafil (VIAGRA) 100 MG tablet Take 100 mg by mouth daily as needed for erectile dysfunction.    Marland Kitchen spironolactone (ALDACTONE) 25 MG tablet Take 25 mg by mouth daily.     . tamsulosin (FLOMAX) 0.4 MG CAPS Take 0.4 mg by mouth daily.    Marland Kitchen topiramate (TOPAMAX) 25 MG capsule Take 25 mg by mouth daily.    . traMADol (ULTRAM) 50 MG tablet Take 100 mg by mouth at bedtime as needed for moderate pain.      No current facility-administered medications on file prior to visit.     Review of Systems Constitutional:   No  weight loss, night  sweats,  Fevers, chills, fatigue, or  lassitude.  HEENT:   No headaches,  Difficulty  swallowing,  Tooth/dental problems, or  Sore throat,                No sneezing, itching, ear ache, nasal congestion, post nasal drip,   CV:  No chest pain,  Orthopnea, PND, swelling in lower extremities, anasarca, dizziness, palpitations, syncope.   GI  No heartburn, indigestion, abdominal pain, nausea, vomiting, diarrhea, change in bowel habits, loss of appetite, bloody stools.   Resp: No shortness of breath with exertion or at rest.  No excess mucus, no productive cough,  No non-productive cough,  No coughing up of blood.  No change in color of mucus.  No wheezing.  No chest wall deformity  Skin: no rash or lesions.  GU: no dysuria, change in color of urine, no urgency or frequency.  No flank pain, no hematuria   MS:  No joint pain or swelling.  No decreased range of motion.  No back pain.  Psych:  No change in mood or affect. No depression or anxiety.  No memory loss.         Objective:   Physical Exam Filed Vitals:   04/19/15 0950  BP: 100/74  Pulse: 58  Temp: 98.7 F (37.1 C)  TempSrc: Oral  Height: 5\' 7"  (1.702 m)  Weight: 276 lb (125.193 kg)  SpO2: 95%   GEN: A/Ox3; pleasant , NAD, obese   HEENT:  [place]/AT,  EACs-clear, TMs-wnl, NOSE-clear drainage  THROAT-clear, no lesions, no postnasal drip or exudate noted.   NECK:  Supple w/ fair ROM; no JVD; normal carotid impulses w/o bruits; no thyromegaly or nodules palpated; no lymphadenopathy.  RESP  Few trace wheezes , .no accessory muscle use, no dullness to percussion  CARD:  RRR, no m/r/g  , tr  peripheral edema, pulses intact, no cyanosis or clubbing.  GI:   Soft & nt; nml bowel sounds; no organomegaly or masses detected.  Musco: Warm bil, no deformities or joint swelling noted.   Neuro: alert, no focal deficits noted.    Skin: Warm, no lesions or rashes  Mairely Foxworth NP-C   Pulmonary and Critical Care    04/19/2015        Assessment & Plan:

## 2015-04-19 NOTE — Addendum Note (Signed)
Addended by: Osa Craver on: 04/19/2015 11:04 AM   Modules accepted: Orders

## 2015-04-21 ENCOUNTER — Telehealth: Payer: Self-pay | Admitting: Pulmonary Disease

## 2015-04-21 NOTE — Telephone Encounter (Signed)
If not comfortable at rest p using up to 2 pffs of proair every 4 hours will need to go to ER or I could see in AM as add on at 830 am - whichever he prefers

## 2015-04-21 NOTE — Telephone Encounter (Signed)
Routing to MW. 

## 2015-04-21 NOTE — Telephone Encounter (Signed)
Patient notified of Dr. Gustavus Bryant recommendations. Scheduled to see Dr. Melvyn Novas in the morning at 8:30am. Nothing further needed.

## 2015-04-21 NOTE — Telephone Encounter (Signed)
Spoke with pt. He was here on Monday and saw TP. TP diagnosed him with bronchitis. Pt was given an antibiotic and prednisone taper. Reports that he is having chest tightness and chest congestion. Would like to come in for a breathing treatment if possible. We do not have any available appointments today. Pt would like VS's recommendations on what to do from here.  VS - please advise. Thanks.

## 2015-04-22 ENCOUNTER — Encounter: Payer: Self-pay | Admitting: Internal Medicine

## 2015-04-22 ENCOUNTER — Ambulatory Visit (INDEPENDENT_AMBULATORY_CARE_PROVIDER_SITE_OTHER)
Admission: RE | Admit: 2015-04-22 | Discharge: 2015-04-22 | Disposition: A | Discharge status: Home / Self Care | Payer: Medicare Other | Source: Ambulatory Visit | Attending: Internal Medicine | Admitting: Internal Medicine

## 2015-04-22 ENCOUNTER — Ambulatory Visit (INDEPENDENT_AMBULATORY_CARE_PROVIDER_SITE_OTHER): Payer: Medicare Other | Admitting: Internal Medicine

## 2015-04-22 VITALS — BP 112/60 | HR 66 | Temp 98.0°F | Ht 67.0 in | Wt 276.0 lb

## 2015-04-22 DIAGNOSIS — I2510 Atherosclerotic heart disease of native coronary artery without angina pectoris: Secondary | ICD-10-CM

## 2015-04-22 DIAGNOSIS — J4531 Mild persistent asthma with (acute) exacerbation: Secondary | ICD-10-CM

## 2015-04-22 DIAGNOSIS — R05 Cough: Secondary | ICD-10-CM | POA: Diagnosis not present

## 2015-04-22 DIAGNOSIS — J45901 Unspecified asthma with (acute) exacerbation: Secondary | ICD-10-CM | POA: Insufficient documentation

## 2015-04-22 MED ORDER — MOMETASONE FURO-FORMOTEROL FUM 100-5 MCG/ACT IN AERO: INHALATION_SPRAY | RESPIRATORY_TRACT | Status: DC

## 2015-04-22 MED ORDER — PREDNISONE 10 MG PO TABS: ORAL_TABLET | ORAL | Status: DC

## 2015-04-22 MED ORDER — ALBUTEROL SULFATE HFA 108 (90 BASE) MCG/ACT IN AERS: INHALATION_SPRAY | RESPIRATORY_TRACT | Status: DC

## 2015-04-22 NOTE — Patient Instructions (Addendum)
I emphasized that nasal steroids (flonase) have no immediate benefit in terms of improving symptoms.  To help them reached the target tissue, the patient should use Afrin two puffs every 12 hours applied one min before using the nasal steroids.  Afrin should be stopped after no more than 5 days.  If the symptoms worsen, Afrin can be restarted after 5 days off of therapy to prevent rebound congestion from overuse of Afrin.  I also emphasized that in no way are nasal steroids a concern in terms of "addiction".   Plan A = Automatic = Dulera 100 Take 2 puffs first thing in am and then another 2 puffs about 12 hours later and Flovent   Plan B = Backup Only use your albuterol (proair) as a rescue medication to be used if you can't catch your breath by resting or doing a relaxed purse lip breathing pattern.  - The less you use it, the better it will work when you need it. - Ok to use up to 2 puffs  every 4 hours if you must but call for appointment if use goes up over your usual need - Don't leave home without it !!  (think of it like the spare tire for your car)   For cough > mucinex dm up to 1200 mg every 12 hours and supplement with tramadol up every 4 hours   Try omeprazole 40 mg  Take 30-60 min before first meal of the day and Pepcid ac (famotidine) 20 mg one @  bedtime until return (over the counter)  Please remember to go to the x-ray department downstairs for your tests - we will call you with the results when they are available.  See Tammy NP w/in 2 weeks with all your medications, even over the counter meds to be sure med list is accurate

## 2015-04-22 NOTE — Progress Notes (Signed)
Subjective:     Patient ID: Carlos Dawson, male   DOB: 09-16-1940   MRN: [id number]  HPI  75 yo male with OSA and Asthma followed by Dr. Halford Chessman   TEST  Tests PSG 03/30/97 >> AHI 21 ONO with BPAP and room air 12/12/10 >> Test time 6 hrs 56 min. Basal SpO2 93.1%, low SpO2 86%. Spent 2.8 min with SpO2 < 88%. PFT 12/20/10 >> FEV1 2.13(79%), FEV1% 71, TLC 5.80(100%), DLCO 57%, no BD CT chest 09/14/11 >> Stable dilatation of ascending thoracic aorta 4.7 cm, PA 4.1 cm PSG 10/21/13 >> AHI 40.1, SaO2 80%, PLMI 75.6, 7 beat VT CPAP 12/28/13 >> CPAP 10 cm H2O >> AHI 1.5, +R  CPAP 12/10/14 to 03/09/15 >> used on 88 of 90 nights with average 7 hrs and 39 min. Average AHI is 3.3 with CPAP 10 cm H2O.   04/19/2015 Acute OV NP Pt presents for an acute office visit. Complains of non prod cough, chest tightness/congestion, watery eyes and ear pain. SOB, wheezing, fever of 100.0, and sinus headaches x 1 week.   Remains on Flovent Twice daily And Singular.  Wife is sick . rec Omnicef 300mg  Twice daily  For 7 days -take with food.  Mucinex DM Twice daily  As needed  Cough/congestion  Prednisone taper over next week.  Fluids and rest    04/22/2015 acute extended ov/Roshawna Colclasure re: ? aeAB on flovent 110/ singulair  maint / usually no need for saba at all / on ppi but not ac    Chief Complaint  Patient presents with  . Acute Visit    Pt seen 04/19/15 with cough, wheezing, chest tightness and SOB- not doing much better at all since then and now c/o sinus pressure/drainage. His cough is non prod. He was unable to sleep with BIPAP last night.   developed dry hacky cough while at beach some better initially p last ov then much worse last 24 h assoc with severe nasal congestion and now can't use his bipap at all, very poor insight into meds/ otcs/prns  No resting sob p saba   No obvious day to day or daytime variability or assoc  cp or  Or hb symptoms. No unusual exp hx or h/o childhood pna/ asthma or knowledge  of premature birth.   Also denies any obvious fluctuation of symptoms with weather or environmental changes or other aggravating or alleviating factors except as outlined above   Current Medications, Allergies, Complete Past Medical History, Past Surgical History, Family History, and Social History were reviewed in [employer] record.  ROS  The following are not active complaints unless bolded sore throat, dysphagia, dental problems, itching, sneezing,  nasal congestion or excess/ purulent secretions, ear ache,   fever, chills, sweats, unintended wt loss, classically pleuritic or exertional cp, hemoptysis,  orthopnea pnd or leg swelling, presyncope, palpitations, abdominal pain, anorexia, nausea, vomiting, diarrhea  or change in bowel or bladder habits, change in stools or urine, dysuria,hematuria,  rash, arthralgias, visual complaints, headache, numbness, weakness or ataxia or problems with walking or coordination,  change in mood/affect or memory.           Review of Systems     Objective:   Physical Exam    amb obese wm nad   Wt Readings from Last 3 Encounters:  04/22/15 276 lb (125.193 kg)  04/19/15 276 lb (125.193 kg)  03/30/15 271 lb 3.2 oz (123.016 kg)    Vital signs reviewed  HEENT: nl  dentition, and oropharynx. Nl external ear canals without cough reflex - moderate bilateral non-specific turbinate edema    NECK :  without JVD/Nodes/TM/ nl carotid upstrokes bilaterally   LUNGS: no acc muscle use,  Nl contour chest   With insp and exp rhonchi/ component of pseudowheeze as well   CV:  RRR  no s3 or murmur or increase in P2, no edema   ABD:  soft and nontender with nl inspiratory excursion in the supine position. No bruits or organomegaly, bowel sounds nl  MS:  Nl gait/ ext warm without deformities, calf tenderness, cyanosis or clubbing No obvious joint restrictions   SKIN: warm and dry without lesions    NEURO:  alert, approp, nl sensorium  with  no motor deficits     CXR PA and Lateral:   04/22/2015 :    I personally reviewed images and agree with radiology impression as follows:   1. Stable cardiomegaly. 2. Low lung volumes with mild bibasilar atelectasis and/or infiltrates.    Assessment:

## 2015-04-23 DIAGNOSIS — C61 Malignant neoplasm of prostate: Secondary | ICD-10-CM | POA: Diagnosis not present

## 2015-04-26 ENCOUNTER — Encounter: Payer: Self-pay | Admitting: Internal Medicine

## 2015-04-26 NOTE — Assessment & Plan Note (Addendum)
   DDX of  difficult airways management almost all start with A and  include Adherence, Ace Inhibitors, Acid Reflux, Active Sinus Disease, Alpha 1 Antitripsin deficiency, Anxiety masquerading as Airways dz,  ABPA,  Allergy(esp in young), Aspiration (esp in elderly), Adverse effects of meds,  Active smokers, A bunch of PE's (a small clot burden can't cause this syndrome unless there is already severe underlying pulm or vascular dz with poor reserve) plus two Bs  = Bronchiectasis and Beta blocker use..and one C= CHF   Adherence is always the initial "prime suspect" and is a multilayered concern that requires a "trust but verify" approach in every patient - starting with knowing how to use medications, especially inhalers, correctly, keeping up with refills and understanding the fundamental difference between maintenance and prns vs those medications only taken for a very short course and then stopped and not refilled.  - very poor insight into meds/ names - - The proper method of use, as well as anticipated side effects, of a metered-dose inhaler are discussed and demonstrated to the patient. Improved effectiveness after extensive coaching during this visit to a level of approximately 75 % from a baseline of 50  % > try duler a100 2bid instead of flovent since actively wheezing  ? Active / acute sinus dz > complete the omnicef then ct sinus if not better and rx for rhinitis more aggressively with topicals - see avs  ? Allergy > finish Pred rx / continue singulair  I had an extended discussion with the patient reviewing all relevant studies completed to date and  lasting 25 minutes of a 40 minute acute extended office  visit    Each maintenance medication was reviewed in detail including most importantly the difference between maintenance and prns and under what circumstances the prns are to be triggered using an action plan format that is not reflected in the computer generated alphabetically organized  AVS.    Please see instructions for details which were reviewed in writing and the patient given a copy highlighting the part that I personally wrote and discussed at today's ov.

## 2015-04-26 NOTE — Assessment & Plan Note (Signed)
Complicated by osa  Body mass index is 43.2   Lab Results  Component Value Date   TSH 1.686 01/06/2013     Contributing to gerd tendency/ doe/reviewed the need and the process to achieve and maintain neg calorie balance > defer f/u primary care including intermittently monitoring thyroid status

## 2015-05-11 ENCOUNTER — Other Ambulatory Visit (INDEPENDENT_AMBULATORY_CARE_PROVIDER_SITE_OTHER): Payer: Medicare Other

## 2015-05-11 ENCOUNTER — Encounter: Payer: Self-pay | Admitting: Adult Health

## 2015-05-11 ENCOUNTER — Ambulatory Visit (INDEPENDENT_AMBULATORY_CARE_PROVIDER_SITE_OTHER): Payer: Medicare Other | Admitting: Adult Health

## 2015-05-11 VITALS — BP 112/62 | HR 56 | Temp 97.8°F | Ht 67.0 in | Wt 280.0 lb

## 2015-05-11 DIAGNOSIS — J4541 Moderate persistent asthma with (acute) exacerbation: Secondary | ICD-10-CM | POA: Diagnosis not present

## 2015-05-11 DIAGNOSIS — R0600 Dyspnea, unspecified: Secondary | ICD-10-CM

## 2015-05-11 LAB — CBC WITH DIFFERENTIAL/PLATELET
Basophils Absolute: 0 10*3/uL (ref 0.0–0.1)
Basophils Relative: 0.5 % (ref 0.0–3.0)
Eosinophils Absolute: 0.1 10*3/uL (ref 0.0–0.7)
Eosinophils Relative: 2 % (ref 0.0–5.0)
HCT: 35.1 % — ABNORMAL LOW (ref 39.0–52.0)
Hemoglobin: 11.9 g/dL — ABNORMAL LOW (ref 13.0–17.0)
Lymphocytes Relative: 25.1 % (ref 12.0–46.0)
Lymphs Abs: 1.3 10*3/uL (ref 0.7–4.0)
MCHC: 34 g/dL (ref 30.0–36.0)
MCV: 91.2 fl (ref 78.0–100.0)
Monocytes Absolute: 0.5 10*3/uL (ref 0.1–1.0)
Monocytes Relative: 9.5 % (ref 3.0–12.0)
Neutro Abs: 3.2 10*3/uL (ref 1.4–7.7)
Neutrophils Relative %: 62.9 % (ref 43.0–77.0)
Platelets: 138 10*3/uL — ABNORMAL LOW (ref 150.0–400.0)
RBC: 3.85 Mil/uL — ABNORMAL LOW (ref 4.22–5.81)
RDW: 14.5 % (ref 11.5–15.5)
WBC: 5.1 10*3/uL (ref 4.0–10.5)

## 2015-05-11 LAB — BASIC METABOLIC PANEL
BUN: 21 mg/dL (ref 6–23)
CO2: 28 mEq/L (ref 19–32)
Calcium: 9 mg/dL (ref 8.4–10.5)
Chloride: 108 mEq/L (ref 96–112)
Creatinine, Ser: 1.1 mg/dL (ref 0.40–1.50)
GFR: 69.63 mL/min (ref >60.00)
Glucose, Bld: 212 mg/dL — ABNORMAL HIGH (ref 70–99)
Potassium: 3.8 mEq/L (ref 3.5–5.1)
Sodium: 142 mEq/L (ref 135–145)

## 2015-05-11 LAB — TSH: TSH: 1.29 u[IU]/mL (ref 0.35–4.50)

## 2015-05-11 LAB — BRAIN NATRIURETIC PEPTIDE: Pro B Natriuretic peptide (BNP): 96 pg/mL (ref 0.0–100.0)

## 2015-05-11 MED ORDER — MOMETASONE FURO-FORMOTEROL FUM 100-5 MCG/ACT IN AERO: INHALATION_SPRAY | RESPIRATORY_TRACT | Status: DC

## 2015-05-11 NOTE — Assessment & Plan Note (Signed)
Progressive DOE, suspect is multifactoral with recent AB flare, multiple comorbidities /polypharmacy, and morbid obesity in very sedentary lifestyle Spirometry shows drop in FEV1 , Dulera added last ov .  No desats with ambulation  Recent cxr w/ nad  Check labs with cbc /tsh/bmet /bnp   Plan  Continue on Dulera 2 puffs Twice daily  , rinse after use.  Saline nasal rinses As needed   Use flonase As needed  For nasal congestion .  Follow med calendar closely and bring to each visit.  Follow up with Dr. Halford Chessman  In 2-3 months and As needed   Please contact office for sooner follow up if symptoms do not improve or worsen or seek emergency care

## 2015-05-11 NOTE — Addendum Note (Signed)
Addended by: Osa Craver on: 05/11/2015 02:15 PM   Modules accepted: Orders, Medications

## 2015-05-11 NOTE — Patient Instructions (Addendum)
Continue on Dulera 2 puffs Twice daily  , rinse after use.  Saline nasal rinses As needed   Use flonase As needed  For nasal congestion .  Follow med calendar closely and bring to each visit.  Follow up with Dr. Halford Chessman  In 2-3 months and As needed   Please contact office for sooner follow up if symptoms do not improve or worsen or seek emergency care

## 2015-05-11 NOTE — Progress Notes (Signed)
Reviewed and agree with assessment/plan.  Chesley Mires, MD [facility] Pulmonary/Critical Care 05/11/2015, 3:48 PM Pager:  [phone]

## 2015-05-11 NOTE — Assessment & Plan Note (Addendum)
Slowly resolving AB flare  Spirometry does show a drop in FEV1 -would stay on Dulera .  No desat with walking.  Recent cxr with no acute process.  Control for triggers.  Patient's medications were reviewed today and patient education was given. Computerized medication calendar was adjusted/completed  Can consider repeat spirometry on return if needed.   Plan Continue on Dulera 2 puffs Twice daily  , rinse after use.  Saline nasal rinses As needed   Use flonase As needed  For nasal congestion .  Follow med calendar closely and bring to each visit.  Follow up with Dr. Halford Chessman  In 2-3 months and As needed   Please contact office for sooner follow up if symptoms do not improve or worsen or seek emergency care

## 2015-05-11 NOTE — Progress Notes (Signed)
Subjective:    Patient ID: Carlos Dawson, male    DOB: 25-Aug-1940, 75 y.o.   MRN: [id number]  HPI 75 yo male with OSA and Asthma followed by Dr. Halford Chessman    TEST  Tests PSG 03/30/97 >> AHI 21 ONO with BPAP and room air 12/12/10 >> Test time 6 hrs 56 min. Basal SpO2 93.1%, low SpO2 86%. Spent 2.8 min with SpO2 < 88%. PFT 12/20/10 >> FEV1 2.13(79%), FEV1% 71, TLC 5.80(100%), DLCO 57%, no BD CT chest 09/14/11 >> Stable dilatation of ascending thoracic aorta 4.7 cm, PA 4.1 cm PSG 10/21/13 >> AHI 40.1, SaO2 80%, PLMI 75.6, 7 beat VT CPAP 12/28/13 >> CPAP 10 cm H2O >> AHI 1.5, +R  CPAP 12/10/14 to 03/09/15 >> used on 88 of 90 nights with average 7 hrs and 39 min. Average AHI is 3.3 with CPAP 10 cm H2O.   05/11/2015 Follow up : Asthma  Pt presents for  follow up . Recently had a slow to resolve AB flare . Tx w/ Omnicef and pred taper 3 weeks ago. He did not feel much better and came back and seen Dr. Melvyn Novas  . Started on Kincaid.(from flovent) CXR last ov with no acute process.  He is feeling better but has some lingering drainage, and congestion .  Complains that he gets winded with actiivity especially with steps. This has been progressive over the years. Frustrated that he can not do as much as he did 33yrs ago.  Had a CT chest in Sept to follow TAA , there was no PE , adenopathy, or lung parenchymal abnormalities.  Walk test in office with no desaturations on room air.  Spirometry today shows decreased FEV1 from 2012 at 55%, ratio 69 , FVC 61%.  He is followed by cardiology with recent visit last month , previous Coronary stent in 2012 , low risk myoview in 2014, Cath in 2016 with no obstructive dz. Echo 216 with EF 55-60%, Gr 1 DD  He denies fever, chest pain, orthopnea, increased edema or n//v.d  He is very sedentary and does not exercise -none for >8-10 yr . Does not do yard work. Does light house work only.  We reviewed  all his meds and organized them into a med calendar. Appears to be  taking them correctly     Past Medical History  Diagnosis Date  . Hypertension   . Obesities, morbid ([place])   . Pulmonary embolism ([place])     2008  . Anticoagulant long-term use   . OA (osteoarthritis)   . LVH (left ventricular hypertrophy)   . Colonic polyp   . Mild intermittent asthma   . Hemorrhoids   . Diastolic dysfunction   . ASCVD (arteriosclerotic cardiovascular disease)     Prior BMS to the 2nd OM in September 2012; with repeat cath in October showing patency  . GERD (gastroesophageal reflux disease)   . IBS (irritable bowel syndrome)   . Aortic root enlargement ([place])   . Ascending aortic aneurysm [facility])     recent scan in October 2012 showing no change; followed by Dr. Servando Snare  . PAF (paroxysmal atrial fibrillation) ([place])     treated with Coumadin  . Hearing loss   . Contact lens/glasses fitting   . OSA (obstructive sleep apnea)     PSG 03/30/97 AHI 21, BPAP 13/9  . Sleep apnea   . Thoracic aortic aneurysm (HCC)     Aortic Size Index=     5.0    /  Body surface area is 2.43 meters squared. = 2.05  < 2.75 cm/m2      4% risk per year 2.75 to 4.25          8% risk per year > 4.25 cm/m2    20% risk per year   Stable aneurysmal dilation of the ascending aorta with maximum AP diameter of 4.8 cm. Stable area of narrowing of the proximal most portion of the descending aorta measuring 2 cm., previously identified as an area of coarctation. No evidence of aortic dissection.  Coronary artery disease.  Normal appearance of the lungs.   Electronically Signed   By: Fidela Salisbury M.D.   On: 10/01/2014 08:50    . CAD (coronary artery disease)     a. s/p BMS to 2nd OM in Sept 2012; b. LexiScan Myoview (12/2012):  Inf infarct; bowel and motion artifact make study difficult to interpret; no ischemia; not gated; Low Risk  . CHF (congestive heart failure) ([place])     no recent issues 10/13/14  . SOB (shortness of breath)     on excertion  . Diabetes mellitus without complication (HCC)      metphormin, average 154  . Generalized headaches     neck stenosis  . Prostate CA [facility])     Oncologist  DR. Daralene Milch [facility] dx 09/24/14, undetermined tx    Current Outpatient Prescriptions on File Prior to Visit  Medication Sig Dispense Refill  . acetaminophen (TYLENOL) 325 MG tablet Take 325-650 mg by mouth every 6 (six) hours as needed for headache.     . albuterol (PROAIR HFA) 108 (90 Base) MCG/ACT inhaler 2 puffs every 4 hours as needed only  if your can't catch your breath 1 Inhaler 11  . atenolol (TENORMIN) 25 MG tablet Take 25 mg by mouth daily.    Marland Kitchen atorvastatin (LIPITOR) 20 MG tablet Take 1 tablet (20 mg total) by mouth daily. (Patient taking differently: Take 10 mg by mouth daily. ) 90 tablet 3  . calcium carbonate (OS-CAL - DOSED IN MG OF ELEMENTAL CALCIUM) 1250 (500 CA) MG tablet Take 1 tablet by mouth daily with breakfast.    . cefdinir (OMNICEF) 300 MG capsule Take 1 capsule (300 mg total) by mouth 2 (two) times daily. 14 capsule 0  . celecoxib (CELEBREX) 200 MG capsule Take 200 mg by mouth 2 (two) times daily.    . Cholecalciferol (VITAMIN D-3) 5000 UNITS TABS Take 5,000 Units by mouth daily.    . clobetasol cream (TEMOVATE) 0.05 % APPLY TO AFFECTED AREA ON LEGS UP TO TWICE DAILY AS NEEDED *NOT TO FACE, GROIN OR UNDERARMS*  3  . dextromethorphan-guaiFENesin (MUCINEX DM) 30-600 MG 12hr tablet Take 2 tablets by mouth 2 (two) times daily as needed for cough.    . furosemide (LASIX) 80 MG tablet Take 1 tablet (80 mg total) by mouth daily. Take 80 mg in the morning and 40 mg in the afternoon.    Marland Kitchen ipratropium (ATROVENT) 0.03 % nasal spray Place 2 sprays into both nostrils every 12 (twelve) hours as needed for rhinitis.    Marland Kitchen KLOR-CON M20 20 MEQ tablet TAKE 2 TABLETS (40 MEQ TOTAL) BY MOUTH 2 (TWO) TIMES DAILY. 360 tablet 1  . Loratadine (CLARITIN) 10 MG CAPS Take 1 capsule by mouth daily as needed.    Marland Kitchen losartan (COZAAR) 100 MG tablet Take 50 mg by mouth at bedtime.     . metFORMIN  (GLUCOPHAGE) 500 MG tablet Take 500 mg by mouth daily  with breakfast.     . mometasone-formoterol (DULERA) 100-5 MCG/ACT AERO Take 2 puffs first thing in am and then another 2 puffs about 12 hours later. 1 Inhaler   . montelukast (SINGULAIR) 10 MG tablet Take 10 mg by mouth at bedtime.    . nitroGLYCERIN (NITROSTAT) 0.4 MG SL tablet Place 1 tablet (0.4 mg total) under the tongue every 5 (five) minutes as needed for chest pain. 25 tablet 6  . predniSONE (DELTASONE) 10 MG tablet 4 tabs for 2 days, then 3 tabs for 2 days, 2 tabs for 2 days, then 1 tab for 2 days, then stop 20 tablet 0  . predniSONE (DELTASONE) 10 MG tablet Take  4 each am x 2 days,   2 each am x 2 days,  1 each am x 2 days and stop 14 tablet 0  . rivaroxaban (XARELTO) 20 MG TABS tablet TAKE 1 TABLET BY MOUTH EVERY DAY 90 tablet 1  . sildenafil (VIAGRA) 100 MG tablet Take 100 mg by mouth daily as needed for erectile dysfunction.    Marland Kitchen spironolactone (ALDACTONE) 25 MG tablet Take 25 mg by mouth daily.     . tamsulosin (FLOMAX) 0.4 MG CAPS Take 0.4 mg by mouth daily.    Marland Kitchen topiramate (TOPAMAX) 25 MG capsule Take 25 mg by mouth daily.    . traMADol (ULTRAM) 50 MG tablet Take 100 mg by mouth at bedtime as needed for moderate pain.      No current facility-administered medications on file prior to visit.     Review of Systems Constitutional:   No  weight loss, night sweats,  Fevers, chills, + fatigue, or  lassitude.  HEENT:   No headaches,  Difficulty swallowing,  Tooth/dental problems, or  Sore throat,                No sneezing, itching, ear ache,  +nasal congestion, post nasal drip,   CV:  No chest pain,  Orthopnea, PND, swelling in lower extremities, anasarca, dizziness, palpitations, syncope.   GI  No heartburn, indigestion, abdominal pain, nausea, vomiting, diarrhea, change in bowel habits, loss of appetite, bloody stools.   Resp: No shortness of breath with exertion or at rest.  No excess mucus, no productive cough,  No  non-productive cough,  No coughing up of blood.  No change in color of mucus.  No wheezing.  No chest wall deformity  Skin: no rash or lesions.  GU: no dysuria, change in color of urine, no urgency or frequency.  No flank pain, no hematuria   MS:  No joint pain or swelling.  No decreased range of motion.  No back pain.  Psych:  No change in mood or affect. No depression or anxiety.  No memory loss.         Objective:   Physical Exam Filed Vitals:   05/11/15 1057  BP: 112/62  Pulse: 56  Temp: 97.8 F (36.6 C)  TempSrc: Oral  Height: 5\' 7"  (1.702 m)  Weight: 280 lb (127.007 kg)  SpO2: 96%  Body mass index is 43.84 kg/(m^2).   GEN: A/Ox3; pleasant , NAD, obese   HEENT:  [place]/AT,  EACs-clear, TMs-wnl, NOSE-clear drainage  THROAT-clear, no lesions, no postnasal drip or exudate noted.   NECK:  Supple w/ fair ROM; no JVD; normal carotid impulses w/o bruits; no thyromegaly or nodules palpated; no lymphadenopathy.  RESP  CTA w/ no wheezes , .no accessory muscle use, no dullness to percussion  CARD:  RRR, no  m/r/g  , tr  peripheral edema, pulses intact, no cyanosis or clubbing.  GI:   Soft & nt; nml bowel sounds; no organomegaly or masses detected.  Musco: Warm bil, no deformities or joint swelling noted.   Neuro: alert, no focal deficits noted.    Skin: Warm, no lesions or rashes  Evellyn Tuff NP-C  [facility]   05/11/2015        Assessment & Plan:

## 2015-05-12 ENCOUNTER — Other Ambulatory Visit: Payer: Self-pay | Admitting: Adult Health

## 2015-05-12 MED ORDER — MOMETASONE FURO-FORMOTEROL FUM 100-5 MCG/ACT IN AERO: INHALATION_SPRAY | RESPIRATORY_TRACT | Status: DC

## 2015-05-13 NOTE — Progress Notes (Signed)
Quick Note:  Called spoke with pt. Reviewed results and recs. Pt voiced understanding and had no further questions. ______ 

## 2015-05-17 DIAGNOSIS — M4802 Spinal stenosis, cervical region: Secondary | ICD-10-CM | POA: Diagnosis not present

## 2015-05-18 DIAGNOSIS — M65312 Trigger thumb, left thumb: Secondary | ICD-10-CM | POA: Diagnosis not present

## 2015-05-19 DIAGNOSIS — Z6841 Body Mass Index (BMI) 40.0 and over, adult: Secondary | ICD-10-CM | POA: Diagnosis not present

## 2015-05-19 DIAGNOSIS — M4802 Spinal stenosis, cervical region: Secondary | ICD-10-CM | POA: Diagnosis not present

## 2015-05-19 DIAGNOSIS — M545 Low back pain: Secondary | ICD-10-CM | POA: Diagnosis not present

## 2015-05-19 DIAGNOSIS — M4804 Spinal stenosis, thoracic region: Secondary | ICD-10-CM | POA: Diagnosis not present

## 2015-06-03 DIAGNOSIS — I10 Essential (primary) hypertension: Secondary | ICD-10-CM | POA: Diagnosis not present

## 2015-06-03 DIAGNOSIS — I480 Paroxysmal atrial fibrillation: Secondary | ICD-10-CM | POA: Diagnosis not present

## 2015-06-03 DIAGNOSIS — M7580 Other shoulder lesions, unspecified shoulder: Secondary | ICD-10-CM | POA: Diagnosis not present

## 2015-06-03 DIAGNOSIS — E559 Vitamin D deficiency, unspecified: Secondary | ICD-10-CM | POA: Diagnosis not present

## 2015-06-03 DIAGNOSIS — E785 Hyperlipidemia, unspecified: Secondary | ICD-10-CM | POA: Diagnosis not present

## 2015-06-03 DIAGNOSIS — Z1389 Encounter for screening for other disorder: Secondary | ICD-10-CM | POA: Diagnosis not present

## 2015-06-03 DIAGNOSIS — Z0000 Encounter for general adult medical examination without abnormal findings: Secondary | ICD-10-CM | POA: Diagnosis not present

## 2015-06-03 DIAGNOSIS — I509 Heart failure, unspecified: Secondary | ICD-10-CM | POA: Diagnosis not present

## 2015-06-03 DIAGNOSIS — Z79899 Other long term (current) drug therapy: Secondary | ICD-10-CM | POA: Diagnosis not present

## 2015-06-03 DIAGNOSIS — Z0001 Encounter for general adult medical examination with abnormal findings: Secondary | ICD-10-CM | POA: Diagnosis not present

## 2015-06-03 DIAGNOSIS — E119 Type 2 diabetes mellitus without complications: Secondary | ICD-10-CM | POA: Diagnosis not present

## 2015-06-03 DIAGNOSIS — Z6841 Body Mass Index (BMI) 40.0 and over, adult: Secondary | ICD-10-CM | POA: Diagnosis not present

## 2015-06-17 DIAGNOSIS — H52223 Regular astigmatism, bilateral: Secondary | ICD-10-CM | POA: Diagnosis not present

## 2015-06-17 DIAGNOSIS — E119 Type 2 diabetes mellitus without complications: Secondary | ICD-10-CM | POA: Diagnosis not present

## 2015-06-17 DIAGNOSIS — H524 Presbyopia: Secondary | ICD-10-CM | POA: Diagnosis not present

## 2015-06-18 ENCOUNTER — Other Ambulatory Visit: Payer: Self-pay | Admitting: Neurological Surgery

## 2015-06-18 DIAGNOSIS — M4802 Spinal stenosis, cervical region: Secondary | ICD-10-CM

## 2015-07-05 ENCOUNTER — Ambulatory Visit
Admission: RE | Admit: 2015-07-05 | Discharge: 2015-07-05 | Disposition: A | Discharge status: Home / Self Care | Payer: Medicare Other | Source: Ambulatory Visit | Attending: Neurological Surgery | Admitting: Neurological Surgery

## 2015-07-05 DIAGNOSIS — M47812 Spondylosis without myelopathy or radiculopathy, cervical region: Secondary | ICD-10-CM | POA: Diagnosis not present

## 2015-07-05 DIAGNOSIS — M4802 Spinal stenosis, cervical region: Secondary | ICD-10-CM

## 2015-07-10 ENCOUNTER — Other Ambulatory Visit: Payer: Self-pay | Admitting: Cardiology

## 2015-07-12 DIAGNOSIS — M4802 Spinal stenosis, cervical region: Secondary | ICD-10-CM | POA: Diagnosis not present

## 2015-07-12 DIAGNOSIS — Z6841 Body Mass Index (BMI) 40.0 and over, adult: Secondary | ICD-10-CM | POA: Diagnosis not present

## 2015-07-12 NOTE — Telephone Encounter (Signed)
Rx(s) sent to pharmacy electronically.  

## 2015-07-29 DIAGNOSIS — M4802 Spinal stenosis, cervical region: Secondary | ICD-10-CM | POA: Diagnosis not present

## 2015-07-29 DIAGNOSIS — G5600 Carpal tunnel syndrome, unspecified upper limb: Secondary | ICD-10-CM | POA: Insufficient documentation

## 2015-07-29 DIAGNOSIS — G5603 Carpal tunnel syndrome, bilateral upper limbs: Secondary | ICD-10-CM | POA: Diagnosis not present

## 2015-08-13 ENCOUNTER — Encounter: Payer: Self-pay | Admitting: Pulmonary Disease

## 2015-08-13 ENCOUNTER — Ambulatory Visit (INDEPENDENT_AMBULATORY_CARE_PROVIDER_SITE_OTHER): Payer: Medicare Other | Admitting: Pulmonary Disease

## 2015-08-13 VITALS — BP 118/64 | HR 60 | Ht 67.0 in | Wt 278.0 lb

## 2015-08-13 DIAGNOSIS — R5381 Other malaise: Secondary | ICD-10-CM | POA: Diagnosis not present

## 2015-08-13 DIAGNOSIS — J9611 Chronic respiratory failure with hypoxia: Secondary | ICD-10-CM

## 2015-08-13 DIAGNOSIS — G4733 Obstructive sleep apnea (adult) (pediatric): Secondary | ICD-10-CM | POA: Diagnosis not present

## 2015-08-13 DIAGNOSIS — Z923 Personal history of irradiation: Secondary | ICD-10-CM | POA: Diagnosis not present

## 2015-08-13 DIAGNOSIS — R0609 Other forms of dyspnea: Secondary | ICD-10-CM

## 2015-08-13 DIAGNOSIS — Z8546 Personal history of malignant neoplasm of prostate: Secondary | ICD-10-CM | POA: Diagnosis not present

## 2015-08-13 DIAGNOSIS — Z08 Encounter for follow-up examination after completed treatment for malignant neoplasm: Secondary | ICD-10-CM | POA: Diagnosis not present

## 2015-08-13 DIAGNOSIS — J4530 Mild persistent asthma, uncomplicated: Secondary | ICD-10-CM

## 2015-08-13 DIAGNOSIS — I2510 Atherosclerotic heart disease of native coronary artery without angina pectoris: Secondary | ICD-10-CM | POA: Diagnosis not present

## 2015-08-13 DIAGNOSIS — R0600 Dyspnea, unspecified: Secondary | ICD-10-CM

## 2015-08-13 DIAGNOSIS — Z6841 Body Mass Index (BMI) 40.0 and over, adult: Secondary | ICD-10-CM

## 2015-08-13 DIAGNOSIS — C61 Malignant neoplasm of prostate: Secondary | ICD-10-CM | POA: Diagnosis not present

## 2015-08-13 LAB — NITRIC OXIDE: Nitric Oxide: 13

## 2015-08-13 MED ORDER — BUDESONIDE-FORMOTEROL FUMARATE 160-4.5 MCG/ACT IN AERO: 2.0000 | INHALATION_SPRAY | Freq: Two times a day (BID) | RESPIRATORY_TRACT | Status: DC

## 2015-08-13 NOTE — Addendum Note (Signed)
Addended by: Virl Cagey on: 08/13/2015 04:49 PM   Modules accepted: Orders

## 2015-08-13 NOTE — Progress Notes (Signed)
Current Outpatient Prescriptions on File Prior to Visit  Medication Sig  . albuterol (PROAIR HFA) 108 (90 Base) MCG/ACT inhaler 2 puffs every 4 hours as needed only  if your can't catch your breath (Patient taking differently: 2 puffs every 4 hours as needed for wheezing and shortness of breath)  . atenolol (TENORMIN) 25 MG tablet Take 25 mg by mouth every morning.   Marland Kitchen atorvastatin (LIPITOR) 20 MG tablet Take 1 tablet (20 mg total) by mouth daily. (Patient taking differently: Take 10 mg by mouth daily. )  . celecoxib (CELEBREX) 200 MG capsule Take 200 mg by mouth 2 (two) times daily.  . fluticasone (FLONASE) 50 MCG/ACT nasal spray Place into both nostrils daily as needed (nasal congestion).  . furosemide (LASIX) 80 MG tablet Take 1 tablet (80 mg total) by mouth daily. Take 80 mg in the morning and 40 mg in the afternoon.  Marland Kitchen KLOR-CON M20 20 MEQ tablet TAKE 2 TABLETS (40 MEQ TOTAL) BY MOUTH 2 (TWO) TIMES DAILY.  Marland Kitchen losartan (COZAAR) 100 MG tablet Take 50 mg by mouth at bedtime.   . metFORMIN (GLUCOPHAGE) 500 MG tablet Take 500 mg by mouth daily with breakfast.   . montelukast (SINGULAIR) 10 MG tablet Take 10 mg by mouth at bedtime.  . nitroGLYCERIN (NITROSTAT) 0.4 MG SL tablet Place 1 tablet (0.4 mg total) under the tongue every 5 (five) minutes as needed for chest pain.  Marland Kitchen omeprazole (PRILOSEC) 20 MG capsule Take 20 mg by mouth daily before breakfast.  . OXYGEN Inhale into the lungs. Use CPAP at bedtime  . spironolactone (ALDACTONE) 25 MG tablet Take 25 mg by mouth at bedtime.   . tamsulosin (FLOMAX) 0.4 MG CAPS Take 0.4 mg by mouth daily.  Marland Kitchen topiramate (TOPAMAX) 25 MG capsule Take 2 tablets by mouth twice a day   No current facility-administered medications on file prior to visit.     Chief Complaint  Patient presents with  . Follow-up    Pt states that he is having SOB, muscle weakness and decreased endurance. Pt states that his has been having some muscle soreness which could be coming  from hormone injection taken with radiation. Pt states that he gives out with exertion.  Wears CPAP nightly.       Pulmonary tests PFT 12/20/10 >> FEV1 2.13(79%), FEV1% 71, TLC 5.80(100%), DLCO 57%, no BD CT chest 09/14/11 >> Stable dilatation of ascending thoracic aorta 4.7 cm, PA 4.1 cm Spirometry 4/181/7 >> FEV1 1.68 (55%), FEV1% 69 CT angio chest 10/01/14 >> aortic dilation FeNO 08/13/15 >> 13 Room air SpO2 with exertion 08/13/15 >> 87% - improved with 2 liters oxygen  Sleep tests PSG 03/30/97 >> AHI 21 ONO with BPAP and room air 12/12/10 >> Test time 6 hrs 56 min. Basal SpO2 93.1%, low SpO2 86%. Spent 2.8 min with SpO2 < 88%. PSG 10/21/13 >> AHI 40.1, SaO2 80%, PLMI 75.6, 7 beat VT CPAP 12/28/13 >> CPAP 10 cm H2O >> AHI 1.5, +R  CPAP 07/13/15 to 08/11/15 >> used on 20 of 30 nights with average 7 hrs 29 min.  Average AHI 3 with CPAP 10 cm H2O  Cardiac tests Echo 04/27/14 >> EF 55 to [id number], grade 1 diastolic CHF, dilated aortic root  Past medical hx HTN, diastolic dysfx, CAD s/p stent, PAF, HLD, TAA, PE in 2008, HA, GERD, IBS, Obesity s/p gastric bypass in 2009, benign positional vertigo, Prostate cancer  Past surgical hx, Allergies, Family hx, Social hx all reviewed.  Vital Signs  BP 118/64 mmHg  Pulse 60  Ht 5\' 7"  (1.702 m)  Wt 278 lb (126.1 kg)  BMI 43.53 kg/m2  SpO2 97%  History of Present Illness Carlos Dawson is a 75 y.o. male former smoker with OSA, and asthma.  He has been getting hormone and radiation therapy for prostate cancer at [facility].  He has finished hormone therapy.  He has noticed more trouble with dyspnea with exertion and muscle fatigue since being on therapy.  He is okay at rest, and recovers his breathing after resting for a few minutes.  He is not having cough, wheeze, sputum, or chest pain.  He denies leg swelling.  He has been using his CPAP.  He would like to switch from [place] to symbicort >> better coverage by insurance.  Physical  Exam  General - No distress ENT - No sinus tenderness, no oral exudate, no LAN Cardiac - s1s2 regular, no murmur Chest - No wheeze/rales/dullness Back - No focal tenderness Abd - Soft, non-tender Ext - No edema, bandage on Lt arm Neuro - Normal strength Skin - No rashes Psych - normal mood, and behavior   CBC Latest Ref Rng 05/11/2015 04/28/2014 01/06/2013  WBC 4.0 - 10.5 K/uL 5.1 6.6 6.7  Hemoglobin 13.0 - 17.0 g/dL 11.9(L) 13.2 13.9  Hematocrit 39.0 - 52.0 % 35.1(L) 38.4(L) 41.8  Platelets 150.0 - 400.0 K/uL 138.0(L) 168 172    CMP Latest Ref Rng 05/11/2015 09/29/2014 04/28/2014  Glucose 70 - 99 mg/dL 212(H) - 147(H)  BUN 6 - 23 mg/dL 21 - 18  Creatinine 0.40 - 1.50 mg/dL 1.10 1.00 0.90  Sodium 135 - 145 mEq/L 142 - 138  Potassium 3.5 - 5.1 mEq/L 3.8 - 4.5  Chloride 96 - 112 mEq/L 108 - 103  CO2 19 - 32 mEq/L 28 - 23  Calcium 8.4 - 10.5 mg/dL 9.0 - 8.6  Total Protein 6.0 - 8.3 g/dL - - -  Total Bilirubin 0.3 - 1.2 mg/dL - - -  Alkaline Phos 39 - 117 U/L - - -  AST 0 - 37 U/L - - -  ALT 0 - 53 U/L - - -    ABG    Component Value Date/Time   PHART 7.336* 05/07/2014 0923   PCO2ART 42.3 05/07/2014 0923   PO2ART 66.0* 05/07/2014 0923   HCO3 22.6 05/07/2014 0923   TCO2 24 05/07/2014 0923   ACIDBASEDEF 3.0* 05/07/2014 0923   O2SAT 91.0 05/07/2014 0923    Chest xray 04/22/15 - cardiomegaly, atelectasis  Assessment/Plan  Obstructive sleep apnea. - He is compliant with therapy and reports benefit from CPAP - continue CPAP 10 cm H2O  Mild, persistent asthma. - change to symbicort in place of dulera due to insurance coverage - continue singulair, and prn albuterol  Dyspnea on exertion. - likely related to exertional hypoxia, and deconditioning  Chronic hypoxic respiratory failure. - will have him use 2 liters oxygen during the day with exertion - will arrange for ONO with CPAP   Patient Instructions  Symbicort two puffs twice per day  Will arrange for 2 liters  oxygen during the day with exertion  Will arrange for overnight oxygen test with CPAP  Will arrange for referral to pulmonary rehab  Follow up in 6 to 8 weeks with Dr. Halford Chessman or Nurse Practitioner     Chesley Mires, MD [place] Pager:  [phone] 08/13/2015, 4:23 PM

## 2015-08-13 NOTE — Patient Instructions (Addendum)
Symbicort two puffs twice per day  Will arrange for 2 liters oxygen during the day with exertion  Will arrange for overnight oxygen test with CPAP  Will arrange for referral to pulmonary rehab  Follow up in 6 to 8 weeks with Dr. Halford Chessman or Nurse Practitioner

## 2015-08-13 NOTE — Addendum Note (Signed)
Addended by: Virl Cagey on: 08/13/2015 04:45 PM   Modules accepted: Orders

## 2015-08-19 ENCOUNTER — Telehealth: Payer: Self-pay | Admitting: Pulmonary Disease

## 2015-08-20 ENCOUNTER — Telehealth: Payer: Self-pay | Admitting: Pulmonary Disease

## 2015-08-20 NOTE — Telephone Encounter (Signed)
Pt requesting last OV note to be sent to Dr. Arletha Grippe at the [facility] at below verified fax #.  This has been sent.  Nothing further needed.

## 2015-08-20 NOTE — Telephone Encounter (Signed)
Spoke with pt. He received a letter from pulmonary rehab that he has questions about. Advised him that he would need to call pulmonary rehab directly about this. Pulmonary rehab's number has been given to the pt. Nothing further was needed.

## 2015-08-24 ENCOUNTER — Telehealth: Payer: Self-pay | Admitting: Pulmonary Disease

## 2015-08-24 NOTE — Telephone Encounter (Signed)
ONO with CPAP 08/19/15 >> test time 8 hrs 40 min.  Basal SpO2 92.3%, low SpO2 86%.  Spent 1.3 min with SpO2 < 88%.  Will have my nurse inform pt that ONO looked good.  He should continue CPAP while asleep, but doesn't need oxygen connected to CPAP.

## 2015-08-25 NOTE — Telephone Encounter (Signed)
Results have been explained to patient, pt expressed understanding. Nothing further needed.  

## 2015-09-13 ENCOUNTER — Encounter (HOSPITAL_COMMUNITY): Payer: Self-pay

## 2015-09-13 ENCOUNTER — Encounter: Payer: Self-pay | Admitting: Pulmonary Disease

## 2015-09-13 ENCOUNTER — Other Ambulatory Visit: Payer: Self-pay | Admitting: Pulmonary Disease

## 2015-09-13 ENCOUNTER — Encounter (HOSPITAL_COMMUNITY)
Admission: RE | Admit: 2015-09-13 | Discharge: 2015-09-13 | Disposition: A | Discharge status: Home / Self Care | Payer: Medicare Other | Source: Ambulatory Visit | Attending: Pulmonary Disease | Admitting: Pulmonary Disease

## 2015-09-13 VITALS — BP 109/54 | HR 61 | Resp 18 | Ht 66.5 in | Wt 275.6 lb

## 2015-09-13 DIAGNOSIS — R5381 Other malaise: Secondary | ICD-10-CM | POA: Diagnosis not present

## 2015-09-13 DIAGNOSIS — G4733 Obstructive sleep apnea (adult) (pediatric): Secondary | ICD-10-CM | POA: Diagnosis not present

## 2015-09-13 DIAGNOSIS — J9611 Chronic respiratory failure with hypoxia: Secondary | ICD-10-CM | POA: Diagnosis not present

## 2015-09-13 DIAGNOSIS — R0609 Other forms of dyspnea: Secondary | ICD-10-CM | POA: Diagnosis not present

## 2015-09-13 DIAGNOSIS — Z6841 Body Mass Index (BMI) 40.0 and over, adult: Secondary | ICD-10-CM | POA: Diagnosis not present

## 2015-09-13 DIAGNOSIS — J4530 Mild persistent asthma, uncomplicated: Secondary | ICD-10-CM | POA: Insufficient documentation

## 2015-09-13 NOTE — Progress Notes (Signed)
Carlos Dawson 75 y.o. male Pulmonary Rehab Orientation Note Patient arrived today in Card[facility] Pul[facility]hab for orientation to Pulmonary Rehab. He was transported from [employer] via wheel chair. He does carry portable oxygen. Per pt, he has just been prescribed oxygen to use on exertion and has just received his POC. He wears a CPAP at HS and his O2 "bleed-in" has been discontinued. Color good, skin warm and dry. Patient is oriented to time and place. Patient's medical history, psychosocial health, and medications reviewed. Psychosocial assessment reveals pt lives with their spouse. Pt is currently retired. Pt hobbies include traveling with his wife now that she is retired. The have a beach home and enjoy spending time there. Pt reports his stress level is low. Pt does not exhibit signs of depression. PHQ2/9 score 0/na. Pt shows good  coping skills with positive outlook. He was offered emotional support and reassurance. Physical assessment reveals heart rate is normal, irregular rhythm. Patient has a history of Afib. breath sounds clear to auscultation, no wheezes, rales, or rhonchi. Grip strength equal, strong. Distal pulses palpable. Patient reports he does take medications as prescribed. Patient states he follows a diabetic, low carb diet. The patient has been trying to lose weight through a healthy diet and exercise program.. Patient's weight will be monitored closely. Demonstration and practice of PLB using pulse oximeter. Patient able to return demonstration satisfactorily. Safety and hand hygiene in the exercise area reviewed with patient. Patient voices understanding of the information reviewed. Department expectations discussed with patient and achievable goals were set. The patient shows enthusiasm about attending the program and we look forward to working with this nice gentlemen. The patient is scheduled for a 6 min walk test on 8/22 and to begin exercise on 8/29.   45 minutes was spent  on a variety of activities such as assessment of the patient, obtaining baseline data including height, weight, BMI, and grip strength, verifying medical history, allergies, and current medications, and teaching patient strategies for performing tasks with less respiratory effort with emphasis on pursed lip breathing.

## 2015-09-14 ENCOUNTER — Encounter (HOSPITAL_COMMUNITY)
Admission: RE | Admit: 2015-09-14 | Discharge: 2015-09-14 | Disposition: A | Discharge status: Home / Self Care | Payer: Medicare Other | Source: Ambulatory Visit | Attending: Pulmonary Disease | Admitting: Pulmonary Disease

## 2015-09-14 DIAGNOSIS — G4733 Obstructive sleep apnea (adult) (pediatric): Secondary | ICD-10-CM | POA: Diagnosis not present

## 2015-09-14 DIAGNOSIS — Z6841 Body Mass Index (BMI) 40.0 and over, adult: Secondary | ICD-10-CM | POA: Diagnosis not present

## 2015-09-14 DIAGNOSIS — R0609 Other forms of dyspnea: Secondary | ICD-10-CM | POA: Diagnosis not present

## 2015-09-14 DIAGNOSIS — J4530 Mild persistent asthma, uncomplicated: Secondary | ICD-10-CM | POA: Diagnosis not present

## 2015-09-14 DIAGNOSIS — R5381 Other malaise: Secondary | ICD-10-CM | POA: Diagnosis not present

## 2015-09-14 DIAGNOSIS — J9611 Chronic respiratory failure with hypoxia: Secondary | ICD-10-CM

## 2015-09-14 NOTE — Progress Notes (Signed)
Pulmonary Individual Treatment Plan  Patient Details  Name: Carlos Dawson MRN: [id number] Date of Birth: 05-29-1940 Referring Provider:   [name] Pulmonary Rehab Walk Test from 09/14/2015 in [place]  Referring Provider  Dr. Halford Chessman      Initial Encounter Date:  Flowsheet Row Pulmonary Rehab Walk Test from 09/14/2015 in [place]  Date  09/14/15  Referring Provider  Dr. Halford Chessman      Visit Diagnosis: Chronic respiratory failure with hypoxia ([place])  Patient's Home Medications on Admission:   Current Outpatient Prescriptions:  .  acetaminophen (TYLENOL) 500 MG tablet, Take 500 mg by mouth as needed., Disp: , Rfl:  .  albuterol (PROAIR HFA) 108 (90 Base) MCG/ACT inhaler, 2 puffs every 4 hours as needed only  if your can't catch your breath (Patient taking differently: 2 puffs every 4 hours as needed for wheezing and shortness of breath), Disp: 1 Inhaler, Rfl: 11 .  atenolol (TENORMIN) 25 MG tablet, Take 25 mg by mouth every morning. , Disp: , Rfl:  .  atorvastatin (LIPITOR) 20 MG tablet, Take 1 tablet (20 mg total) by mouth daily. (Patient taking differently: Take 10 mg by mouth daily. ), Disp: 90 tablet, Rfl: 3 .  budesonide-formoterol (SYMBICORT) 160-4.5 MCG/ACT inhaler, Inhale 2 puffs into the lungs 2 (two) times daily., Disp: 1 Inhaler, Rfl: 6 .  celecoxib (CELEBREX) 200 MG capsule, Take 200 mg by mouth 2 (two) times daily., Disp: , Rfl:  .  dextromethorphan-guaiFENesin (MUCINEX DM) 30-600 MG 12hr tablet, Take by mouth as needed., Disp: , Rfl:  .  fluticasone (FLONASE) 50 MCG/ACT nasal spray, Place into both nostrils daily as needed (nasal congestion)., Disp: , Rfl:  .  fluticasone (FLOVENT HFA) 110 MCG/ACT inhaler, Inhale into the lungs., Disp: , Rfl:  .  furosemide (LASIX) 80 MG tablet, Take 1 tablet (80 mg total) by mouth daily. Take 80 mg in the morning and 40 mg in the afternoon., Disp: , Rfl:  .  KLOR-CON M20 20  MEQ tablet, TAKE 2 TABLETS (40 MEQ TOTAL) BY MOUTH 2 (TWO) TIMES DAILY., Disp: 360 tablet, Rfl: 2 .  losartan (COZAAR) 100 MG tablet, Take 50 mg by mouth at bedtime. , Disp: , Rfl:  .  metFORMIN (GLUCOPHAGE) 500 MG tablet, Take 500 mg by mouth daily with breakfast. , Disp: , Rfl:  .  montelukast (SINGULAIR) 10 MG tablet, Take 10 mg by mouth at bedtime., Disp: , Rfl:  .  nitroGLYCERIN (NITROSTAT) 0.4 MG SL tablet, Place 1 tablet (0.4 mg total) under the tongue every 5 (five) minutes as needed for chest pain., Disp: 25 tablet, Rfl: 6 .  omeprazole (PRILOSEC) 20 MG capsule, Take 20 mg by mouth daily before breakfast., Disp: , Rfl:  .  OXYGEN, Inhale into the lungs. Use CPAP at bedtime, Disp: , Rfl:  .  rivaroxaban (XARELTO) 20 MG TABS tablet, Take 20 mg by mouth., Disp: , Rfl:  .  sildenafil (VIAGRA) 100 MG tablet, Take 100 mg by mouth., Disp: , Rfl:  .  spironolactone (ALDACTONE) 25 MG tablet, Take 25 mg by mouth at bedtime. , Disp: , Rfl:  .  tamsulosin (FLOMAX) 0.4 MG CAPS, Take 0.4 mg by mouth daily., Disp: , Rfl:  .  tiZANidine (ZANAFLEX) 4 MG tablet, Take 4 mg by mouth 3 (three) times daily as needed., Disp: , Rfl: 2 .  topiramate (TOPAMAX) 25 MG capsule, Take 2 tablets by mouth twice a day, Disp: ,  Rfl:  .  traMADol (ULTRAM) 50 MG tablet, Take 50 mg by mouth., Disp: , Rfl:   Past Medical History: Past Medical History:  Diagnosis Date  . Anticoagulant long-term use   . Aortic root enlargement ([place])   . Ascending aortic aneurysm [facility])    recent scan in October 2012 showing no change; followed by Dr. Servando Snare  . ASCVD (arteriosclerotic cardiovascular disease)    Prior BMS to the 2nd OM in September 2012; with repeat cath in October showing patency  . CAD (coronary artery disease)    a. s/p BMS to 2nd OM in Sept 2012; b. LexiScan Myoview (12/2012):  Inf infarct; bowel and motion artifact make study difficult to interpret; no ischemia; not gated; Low Risk  . CHF (congestive heart failure)  ([place])    no recent issues 10/13/14  . Colonic polyp   . Contact lens/glasses fitting   . Diabetes mellitus without complication (HCC)    metphormin, average 154  . Diastolic dysfunction   . Generalized headaches    neck stenosis  . GERD (gastroesophageal reflux disease)   . Hearing loss   . Hemorrhoids   . Hypertension   . IBS (irritable bowel syndrome)   . LVH (left ventricular hypertrophy)   . Mild intermittent asthma   . OA (osteoarthritis)   . Obesities, morbid ([place])   . OSA (obstructive sleep apnea)    PSG 03/30/97 AHI 21, BPAP 13/9  . PAF (paroxysmal atrial fibrillation) ([place])    treated with Coumadin  . Prostate [facility])    Oncologist  DR. Daralene Mil[facility] 09/24/14, undetermined tx   . Pulmonary embolism ([place])    2008  . Sleep apnea   . SOB (shortness of breath)    on excertion  . Thoracic aortic aneurysm (HCC)    Aortic Size Index=     5.0    /Body surface area is 2.43 meters squared. = 2.05  < 2.75 cm/m2      4% risk per year 2.75 to 4.25          8% risk per year > 4.25 cm/m2    20% risk per year   Stable aneurysmal dilation of the ascending aorta with maximum AP diameter of 4.8 cm. Stable area of narrowing of the proximal most portion of the descending aorta measuring 2 cm., previously identified as an area of coarctation. No evidence of aortic dissection.  Coronary artery disease.  Normal appearance of the lungs.   Electronically Signed   By: Fidela Salisbury M.D.   On: 10/01/2014 08:50      Tobacco Use: History  Smoking Status  . Former Smoker  . Packs/day: 1.50  . Years: 30.00  . Quit date: 01/24/1992  Smokeless Tobacco  . Never Used    Comment: Stopped in 1983    Labs: Recent Review Flowsheet Data    Labs for ITP Cardiac and Pulmonary Rehab Latest Ref Rng & Units 02/26/2010 11/20/2011 01/07/2013 05/07/2014 05/07/2014   Cholestrol 0 - 200 mg/dL - 124 118 - -   LDLCALC 0 - 99 mg/dL - 62 51 - -   HDL >39 mg/dL - 41.10 40 - -   Trlycerides <150 mg/dL - 104.0  133 - -   PHART 7.350 - 7.450 - - - - 7.336(L)   PCO2ART 35.0 - 45.0 mmHg - - - - 42.3   HCO3 20.0 - 24.0 mEq/L - - - 23.0 22.6   TCO2 0 - 100 mmol/L 25 - -  24 24   ACIDBASEDEF 0.0 - 2.0 mmol/L - - - 3.0(H) 3.0(H)   O2SAT % - - - 61.0 91.0      Capillary Blood Glucose: Lab Results  Component Value Date   GLUCAP 116 (H) 12/29/2014   GLUCAP 113 (H) 05/07/2014   GLUCAP 135 (H) 05/07/2014   GLUCAP 95 01/28/2013   GLUCAP 97 06/01/2012     ADL UCSD:   Pulmonary Function Assessment:     Pulmonary Function Assessment - 09/13/15 1106      Breath   Bilateral Breath Sounds Clear      Exercise Target Goals: Date: 09/14/15  Exercise Program Goal: Individual exercise prescription set with THRR, safety & activity barriers. Participant demonstrates ability to understand and report RPE using BORG scale, to self-measure pulse accurately, and to acknowledge the importance of the exercise prescription.  Exercise Prescription Goal: Starting with aerobic activity 30 plus minutes a day, 3 days per week for initial exercise prescription. Provide home exercise prescription and guidelines that participant acknowledges understanding prior to discharge.  Activity Barriers & Risk Stratification:     Activity Barriers & Cardiac Risk Stratification - 09/13/15 1104      Activity Barriers & Cardiac Risk Stratification   Activity Barriers Deconditioning;Shortness of Breath;Back Problems;Neck/Spine Problems      6 Minute Walk:     6 Minute Walk    Row Name 09/14/15 1626         6 Minute Walk   Phase Initial     Distance 1210 feet     Walk Time 6 minutes     # of Rest Breaks 0     MPH 2.29     METS 2.68     RPE 13     Perceived Dyspnea  2     Symptoms Yes (comment)     Comments leg fatigue     Resting HR 75 bpm     Resting BP 120/60     Max Ex. HR 104 bpm     Max Ex. BP 148/62     2 Minute Post BP 126/60       Interval HR   Baseline HR 75     1 Minute HR 85     2 Minute  HR 92     3 Minute HR 93     4 Minute HR 97     5 Minute HR 104     6 Minute HR 94     2 Minute Post HR 85     Interval Heart Rate? Yes       Interval Oxygen   Interval Oxygen? Yes     Baseline Oxygen Saturation % 96 %     Baseline Liters of Oxygen 2 L     1 Minute Oxygen Saturation % 95 %     1 Minute Liters of Oxygen 2 L     2 Minute Oxygen Saturation % 93 %     2 Minute Liters of Oxygen 2 L     3 Minute Oxygen Saturation % 91 %     3 Minute Liters of Oxygen 2 L     4 Minute Oxygen Saturation % 91 %     4 Minute Liters of Oxygen 2 L     5 Minute Oxygen Saturation % 90 %     5 Minute Liters of Oxygen 2 L     6 Minute Oxygen Saturation % 90 %     6 Minute  Liters of Oxygen 2 L     2 Minute Post Oxygen Saturation % 93 %     2 Minute Post Liters of Oxygen 2 L        Initial Exercise Prescription:     Initial Exercise Prescription - 09/14/15 1600      Date of Initial Exercise RX and Referring Provider   Date 09/14/15   Referring Provider Dr. Halford Chessman     Oxygen   Oxygen Continuous   Liters 2     NuStep   Level 1   Minutes 17   METs 1.5     Arm Ergometer   Level 1   Minutes 17     Track   Laps 5   Minutes 17     Prescription Details   Frequency (times per week) 2   Duration Progress to 45 minutes of aerobic exercise without signs/symptoms of physical distress     Intensity   THRR 40-80% of Max Heartrate 59-118   Ratings of Perceived Exertion 11-13   Perceived Dyspnea 0-4     Progression   Progression Continue progressive overload as per policy without signs/symptoms or physical distress.     Resistance Training   Training Prescription Yes   Weight blue bands   Reps 10-12      Perform Capillary Blood Glucose checks as needed.  Exercise Prescription Changes:   Exercise Comments:   Discharge Exercise Prescription (Final Exercise Prescription Changes):    Nutrition:  Target Goals: Understanding of nutrition guidelines, daily intake of sodium  1500mg , cholesterol 200mg , calories 30% from fat and 7% or less from saturated fats, daily to have 5 or more servings of fruits and vegetables.  Biometrics:     Pre Biometrics - 09/13/15 1112      Pre Biometrics   Grip Strength 40 kg       Nutrition Therapy Plan and Nutrition Goals:   Nutrition Discharge: Rate Your Plate Scores:   Psychosocial: Target Goals: Acknowledge presence or absence of depression, maximize coping skills, provide positive support system. Participant is able to verbalize types and ability to use techniques and skills needed for reducing stress and depression.  Initial Review & Psychosocial Screening:     Initial Psych Review & Screening - 09/13/15 [place]? Yes     Barriers   Psychosocial barriers to participate in program There are no identifiable barriers or psychosocial needs.     Screening Interventions   Interventions Encouraged to exercise      Quality of Life Scores:   PHQ-9: Recent Review Flowsheet Data    Depression screen [facility] 2/9 09/13/2015   Decreased Interest 0   Down, Depressed, Hopeless 0   PHQ - 2 Score 0      Psychosocial Evaluation and Intervention:     Psychosocial Evaluation - 09/13/15 1110      Psychosocial Evaluation & Interventions   Interventions Encouraged to exercise with the program and follow exercise prescription      Psychosocial Re-Evaluation:  Education: Education Goals: Education classes will be provided on a weekly basis, covering required topics. Participant will state understanding/return demonstration of topics presented.  Learning Barriers/Preferences:     Learning Barriers/Preferences - 09/13/15 1105      Learning Barriers/Preferences   Learning Barriers None   Learning Preferences Written Material;Individual Instruction;Group Instruction;Computer/Internet      Education Topics: Risk Factor Reduction:  -Group instruction that is supported by  a  PowerPoint presentation. Instructor discusses the definition of a risk factor, different risk factors for pulmonary disease, and how the heart and lungs work together.     Nutrition for Pulmonary Patient:  -Group instruction provided by PowerPoint slides, verbal discussion, and written materials to support subject matter. The instructor gives an explanation and review of healthy diet recommendations, which includes a discussion on weight management, recommendations for fruit and vegetable consumption, as well as protein, fluid, caffeine, fiber, sodium, sugar, and alcohol. Tips for eating when patients are short of breath are discussed.   Pursed Lip Breathing:  -Group instruction that is supported by demonstration and informational handouts. Instructor discusses the benefits of pursed lip and diaphragmatic breathing and detailed demonstration on how to preform both.     Oxygen Safety:  -Group instruction provided by PowerPoint, verbal discussion, and written material to support subject matter. There is an overview of "What is Oxygen" and "Why do we need it".  Instructor also reviews how to create a safe environment for oxygen use, the importance of using oxygen as prescribed, and the risks of noncompliance. There is a brief discussion on traveling with oxygen and resources the patient may utilize.   Oxygen Equipment:  -Group instruction provided by [facility] Staff utilizing handouts, written materials, and equipment demonstrations.   Signs and Symptoms:  -Group instruction provided by written material and verbal discussion to support subject matter. Warning signs and symptoms of infection, stroke, and heart attack are reviewed and when to call the physician/911 reinforced. Tips for preventing the spread of infection discussed.   Advanced Directives:  -Group instruction provided by verbal instruction and written material to support subject matter. Instructor reviews Advanced Directive laws  and proper instruction for filling out document.   Pulmonary Video:  -Group video education that reviews the importance of medication and oxygen compliance, exercise, good nutrition, pulmonary hygiene, and pursed lip and diaphragmatic breathing for the pulmonary patient.   Exercise for the Pulmonary Patient:  -Group instruction that is supported by a PowerPoint presentation. Instructor discusses benefits of exercise, core components of exercise, frequency, duration, and intensity of an exercise routine, importance of utilizing pulse oximetry during exercise, safety while exercising, and options of places to exercise outside of rehab.     Pulmonary Medications:  -Verbally interactive group education provided by instructor with focus on inhaled medications and proper administration.   Anatomy and Physiology of the Respiratory System and Intimacy:  -Group instruction provided by PowerPoint, verbal discussion, and written material to support subject matter. Instructor reviews respiratory cycle and anatomical components of the respiratory system and their functions. Instructor also reviews differences in obstructive and restrictive respiratory diseases with examples of each. Intimacy, Sex, and Sexuality differences are reviewed with a discussion on how relationships can change when diagnosed with pulmonary disease. Common sexual concerns are reviewed.   Knowledge Questionnaire Score:   Core Components/Risk Factors/Patient Goals at Admission:     Personal Goals and Risk Factors at Admission - 09/13/15 1108      Core Components/Risk Factors/Patient Goals on Admission    Weight Management Yes;Obesity   Intervention Weight Management: Develop a combined nutrition and exercise program designed to reach desired caloric intake, while maintaining appropriate intake of nutrient and fiber, sodium and fats, and appropriate energy expenditure required for the weight goal.;Weight Management: Provide  education and appropriate resources to help participant work on and attain dietary goals.;Weight Management/Obesity: Establish reasonable short term and long term weight goals.;Obesity: Provide education and appropriate resources to help  participant work on and attain dietary goals.   Expected Outcomes Short Term: Continue to assess and modify interventions until short term weight is achieved;Weight Loss: Understanding of general recommendations for a balanced deficit meal plan, which promotes 1-2 lb weight loss per week and includes a negative energy balance of [phone] kcal/d;Understanding recommendations for meals to include 15-35% energy as protein, 25-35% energy from fat, 35-60% energy from carbohydrates, less than 200mg  of dietary cholesterol, 20-35 gm of total fiber daily;Understanding of distribution of calorie intake throughout the day with the consumption of 4-5 meals/snacks   Increase Strength and Stamina Yes   Intervention Provide advice, education, support and counseling about physical activity/exercise needs.;Develop an individualized exercise prescription for aerobic and resistive training based on initial evaluation findings, risk stratification, comorbidities and participant's personal goals.   Expected Outcomes Achievement of increased cardiorespiratory fitness and enhanced flexibility, muscular endurance and strength shown through measurements of functional capacity and personal statement of participant.   Improve shortness of breath with ADL's Yes   Intervention Provide education, individualized exercise plan and daily activity instruction to help decrease symptoms of SOB with activities of daily living.   Expected Outcomes Short Term: Achieves a reduction of symptoms when performing activities of daily living.   Develop more efficient breathing techniques such as purse lipped breathing and diaphragmatic breathing; and practicing self-pacing with activity Yes   Intervention Provide  education, demonstration and support about specific breathing techniuqes utilized for more efficient breathing. Include techniques such as pursed lipped breathing, diaphragmatic breathing and self-pacing activity.   Expected Outcomes Short Term: Participant will be able to demonstrate and use breathing techniques as needed throughout daily activities.      Core Components/Risk Factors/Patient Goals Review:    Core Components/Risk Factors/Patient Goals at Discharge (Final Review):    ITP Comments:   Comments:

## 2015-09-15 ENCOUNTER — Encounter (HOSPITAL_COMMUNITY): Payer: Self-pay | Admitting: *Deleted

## 2015-09-20 ENCOUNTER — Other Ambulatory Visit: Payer: Self-pay | Admitting: Cardiothoracic Surgery

## 2015-09-20 DIAGNOSIS — I712 Thoracic aortic aneurysm, without rupture, unspecified: Secondary | ICD-10-CM

## 2015-09-21 ENCOUNTER — Encounter (HOSPITAL_COMMUNITY)
Admission: RE | Admit: 2015-09-21 | Discharge: 2015-09-21 | Disposition: A | Discharge status: Home / Self Care | Payer: Medicare Other | Source: Ambulatory Visit | Attending: Pulmonary Disease | Admitting: Pulmonary Disease

## 2015-09-21 VITALS — Wt 277.1 lb

## 2015-09-21 DIAGNOSIS — G4733 Obstructive sleep apnea (adult) (pediatric): Secondary | ICD-10-CM | POA: Diagnosis not present

## 2015-09-21 DIAGNOSIS — J9611 Chronic respiratory failure with hypoxia: Secondary | ICD-10-CM

## 2015-09-21 DIAGNOSIS — R5381 Other malaise: Secondary | ICD-10-CM | POA: Diagnosis not present

## 2015-09-21 DIAGNOSIS — J4530 Mild persistent asthma, uncomplicated: Secondary | ICD-10-CM | POA: Diagnosis not present

## 2015-09-21 DIAGNOSIS — Z6841 Body Mass Index (BMI) 40.0 and over, adult: Secondary | ICD-10-CM | POA: Diagnosis not present

## 2015-09-21 DIAGNOSIS — R0609 Other forms of dyspnea: Secondary | ICD-10-CM | POA: Diagnosis not present

## 2015-09-21 NOTE — Progress Notes (Signed)
Daily Session Note  Patient Details  Name: KODEE RAVERT MRN: [id number] Date of Birth: 1940/07/15 Referring Provider:   [name] Pulmonary Rehab Walk Test from 09/14/2015 in [place]  Referring Provider  Dr. Halford Chessman      Encounter Date: 09/21/2015  Check In:     Session Check In - 09/21/15 1055      Check-In   Location [facility]   Staff Present Rosebud Poles, RN, BSN;Molly diVincenzo, MS, ACSM RCEP, Exercise Physiologist;Lisa Ysidro Evert, Felipe Drone, RN, MHA;Russie Gulledge Rollene Rotunda, RN, BSN   Supervising physician immediately available to respond to emergencies [facility] immediately available   Physician(s) Dr. Marily Memos   Medication changes reported     No   Fall or balance concerns reported    No   Warm-up and Cool-down Performed as group-led instruction   Resistance Training Performed Yes   VAD Patient? No     Pain Assessment   Currently in Pain? No/denies   Multiple Pain Sites No      Capillary Blood Glucose: No results found for this or any previous visit (from the past 24 hour(s)).     POCT Glucose - 09/21/15 1212      POCT Blood Glucose   Pre-Exercise 176 mg/dL   Post-Exercise 137 mg/dL         Exercise Prescription Changes - 09/21/15 1200      Response to Exercise   Blood Pressure (Admit) 102/66   Blood Pressure (Exercise) 132/100   Blood Pressure (Exit) 110/60   Heart Rate (Admit) 62 bpm   Heart Rate (Exercise) 89 bpm   Heart Rate (Exit) 70 bpm   Oxygen Saturation (Admit) 93 %   Oxygen Saturation (Exercise) 88 %   Oxygen Saturation (Exit) 95 %   Rating of Perceived Exertion (Exercise) 12   Perceived Dyspnea (Exercise) 1   Duration Progress to 45 minutes of aerobic exercise without signs/symptoms of physical distress   Intensity Other (comment)  HRR 40-80%     Progression   Progression Continue to progress workloads to maintain intensity without signs/symptoms of physical distress.      Resistance Training   Training Prescription Yes   Weight blue bands   Reps 10-12     Oxygen   Oxygen Continuous   Liters 2     NuStep   Level 2   Minutes 17   METs 1.7     Arm Ergometer   Level 1   Minutes 17     Track   Laps 13   Minutes 17     Goals Met:  Exercise tolerated well Queuing for purse lip breathing No report of cardiac concerns or symptoms Strength training completed today  Goals Unmet:  Not Applicable  Comments: Service time is from 1030 to 1200   Dr. Rush Farmer is Medical Director for Pulmonary Rehab at [facility]

## 2015-09-23 ENCOUNTER — Encounter (HOSPITAL_COMMUNITY)
Admission: RE | Admit: 2015-09-23 | Discharge: 2015-09-23 | Disposition: A | Discharge status: Home / Self Care | Payer: Medicare Other | Source: Ambulatory Visit | Attending: Pulmonary Disease | Admitting: Pulmonary Disease

## 2015-09-23 VITALS — Wt 279.8 lb

## 2015-09-23 DIAGNOSIS — R5381 Other malaise: Secondary | ICD-10-CM | POA: Diagnosis not present

## 2015-09-23 DIAGNOSIS — R0609 Other forms of dyspnea: Secondary | ICD-10-CM | POA: Diagnosis not present

## 2015-09-23 DIAGNOSIS — G4733 Obstructive sleep apnea (adult) (pediatric): Secondary | ICD-10-CM | POA: Diagnosis not present

## 2015-09-23 DIAGNOSIS — J9611 Chronic respiratory failure with hypoxia: Secondary | ICD-10-CM

## 2015-09-23 DIAGNOSIS — J4530 Mild persistent asthma, uncomplicated: Secondary | ICD-10-CM | POA: Diagnosis not present

## 2015-09-23 DIAGNOSIS — Z6841 Body Mass Index (BMI) 40.0 and over, adult: Secondary | ICD-10-CM | POA: Diagnosis not present

## 2015-09-23 NOTE — Progress Notes (Signed)
Daily Session Note  Patient Details  Name: Carlos Dawson MRN: [id number] Date of Birth: 26-Nov-1940 Referring Provider:   [name] Pulmonary Rehab Walk Test from 09/14/2015 in [place]  Referring Provider  Dr. Halford Chessman      Encounter Date: 09/23/2015  Check In:     Session Check In - 09/23/15 1018      Check-In   Location [facility]   Staff Present Su Hilt, MS, ACSM RCEP, Exercise Physiologist;Annedrea Stackhouse, RN, MHA;Portia Rollene Rotunda, RN, Maxcine Ham, RN, Roque Cash, RN   Supervising physician immediately available to respond to emergencies [facility] immediately available   Physician(s) Dr. Marily Memos   Medication changes reported     No   Fall or balance concerns reported    No   Warm-up and Cool-down Performed as group-led instruction   Resistance Training Performed Yes   VAD Patient? No     Pain Assessment   Currently in Pain? No/denies   Multiple Pain Sites No      Capillary Blood Glucose: No results found for this or any previous visit (from the past 24 hour(s)).     POCT Glucose - 09/23/15 1211      POCT Blood Glucose   Pre-Exercise 218 mg/dL   Post-Exercise 159 mg/dL         Exercise Prescription Changes - 09/23/15 1200      Exercise Review   Progression Yes     Response to Exercise   Blood Pressure (Admit) 110/52   Blood Pressure (Exercise) 112/54   Blood Pressure (Exit) 104/50   Heart Rate (Admit) 59 bpm   Heart Rate (Exercise) 79 bpm   Heart Rate (Exit) 65 bpm   Oxygen Saturation (Admit) 97 %   Oxygen Saturation (Exercise) 93 %   Oxygen Saturation (Exit) 93 %   Duration Progress to 45 minutes of aerobic exercise without signs/symptoms of physical distress   Intensity THRR unchanged     Progression   Progression Continue to progress workloads to maintain intensity without signs/symptoms of physical distress.     Resistance Training   Training Prescription Yes   Weight blue bands   Reps 10-12  10 minutes of strength training     Oxygen   Oxygen Continuous   Liters 2     Arm Ergometer   Level 2   Minutes 17     Track   Laps 11   Minutes 17     Goals Met:  Exercise tolerated well No report of cardiac concerns or symptoms Strength training completed today  Goals Unmet:  Not Applicable  Comments: Service time is from 1030 to 1200    Dr. Rush Farmer is Medical Director for Pulmonary Rehab at [facility].

## 2015-09-28 ENCOUNTER — Encounter (HOSPITAL_COMMUNITY)
Admission: RE | Admit: 2015-09-28 | Discharge: 2015-09-28 | Disposition: A | Discharge status: Home / Self Care | Payer: Medicare Other | Source: Ambulatory Visit | Attending: Pulmonary Disease | Admitting: Pulmonary Disease

## 2015-09-28 VITALS — Wt 278.2 lb

## 2015-09-28 DIAGNOSIS — J9611 Chronic respiratory failure with hypoxia: Secondary | ICD-10-CM | POA: Insufficient documentation

## 2015-09-28 DIAGNOSIS — J4530 Mild persistent asthma, uncomplicated: Secondary | ICD-10-CM | POA: Insufficient documentation

## 2015-09-28 DIAGNOSIS — R5381 Other malaise: Secondary | ICD-10-CM | POA: Insufficient documentation

## 2015-09-28 DIAGNOSIS — R0609 Other forms of dyspnea: Secondary | ICD-10-CM | POA: Insufficient documentation

## 2015-09-28 DIAGNOSIS — Z6841 Body Mass Index (BMI) 40.0 and over, adult: Secondary | ICD-10-CM | POA: Insufficient documentation

## 2015-09-28 DIAGNOSIS — G4733 Obstructive sleep apnea (adult) (pediatric): Secondary | ICD-10-CM | POA: Diagnosis not present

## 2015-09-28 NOTE — Progress Notes (Signed)
Daily Session Note  Patient Details  Name: Carlos Dawson MRN: [id number] Date of Birth: 1940/12/19 Referring Provider:   [name] Pulmonary Rehab Walk Test from 09/14/2015 in [place]  Referring Provider  Dr. Halford Chessman      Encounter Date: 09/28/2015  Check In:     Session Check In - 09/28/15 1031      Check-In   Location [facility]   Staff Present Rosebud Poles, RN, BSN;Lisa Ysidro Evert, Felipe Drone, RN, MHA;Portia Rollene Rotunda, RN, BSN   Supervising physician immediately available to respond to emergencies [facility] immediately available   Physician(s) Dr. Marily Memos   Medication changes reported     No   Fall or balance concerns reported    No   Warm-up and Cool-down Performed as group-led instruction   Resistance Training Performed Yes   VAD Patient? No     Pain Assessment   Currently in Pain? No/denies   Multiple Pain Sites No      Capillary Blood Glucose: No results found for this or any previous visit (from the past 24 hour(s)).     POCT Glucose - 09/28/15 1227      POCT Blood Glucose   Pre-Exercise 172 mg/dL   Post-Exercise 144 mg/dL         Exercise Prescription Changes - 09/28/15 1200      Response to Exercise   Blood Pressure (Admit) 104/56   Blood Pressure (Exercise) 120/62   Blood Pressure (Exit) 100/58   Heart Rate (Admit) 58 bpm   Heart Rate (Exercise) 88 bpm   Heart Rate (Exit) 66 bpm   Oxygen Saturation (Admit) 95 %   Oxygen Saturation (Exercise) 91 %   Oxygen Saturation (Exit) 92 %   Duration Progress to 45 minutes of aerobic exercise without signs/symptoms of physical distress   Intensity THRR unchanged     Progression   Progression Continue to progress workloads to maintain intensity without signs/symptoms of physical distress.     Resistance Training   Training Prescription Yes   Weight blue bands   Reps 10-12  10 minutes of strength training     Interval Training   Interval Training No     Oxygen   Oxygen Continuous   Liters 2     NuStep   Level 2   Minutes 17   METs 1.8     Arm Ergometer   Level 1   Minutes 17     Track   Laps 16   Minutes 17     Goals Met:  Exercise tolerated well Strength training completed today  Goals Unmet:  Not Applicable  Comments: Service time is from 1030 to 1215    Dr. Rush Farmer is Medical Director for Pulmonary Rehab at [facility].

## 2015-09-30 ENCOUNTER — Encounter (HOSPITAL_COMMUNITY)
Admission: RE | Admit: 2015-09-30 | Discharge: 2015-09-30 | Disposition: A | Discharge status: Home / Self Care | Payer: Medicare Other | Source: Ambulatory Visit | Attending: Pulmonary Disease | Admitting: Pulmonary Disease

## 2015-09-30 VITALS — Wt 279.5 lb

## 2015-09-30 DIAGNOSIS — R5381 Other malaise: Secondary | ICD-10-CM | POA: Diagnosis not present

## 2015-09-30 DIAGNOSIS — Z6841 Body Mass Index (BMI) 40.0 and over, adult: Secondary | ICD-10-CM | POA: Diagnosis not present

## 2015-09-30 DIAGNOSIS — R0609 Other forms of dyspnea: Secondary | ICD-10-CM | POA: Diagnosis not present

## 2015-09-30 DIAGNOSIS — J4530 Mild persistent asthma, uncomplicated: Secondary | ICD-10-CM | POA: Diagnosis not present

## 2015-09-30 DIAGNOSIS — J9611 Chronic respiratory failure with hypoxia: Secondary | ICD-10-CM

## 2015-09-30 DIAGNOSIS — G4733 Obstructive sleep apnea (adult) (pediatric): Secondary | ICD-10-CM | POA: Diagnosis not present

## 2015-09-30 NOTE — Progress Notes (Signed)
Daily Session Note  Patient Details  Name: Carlos Dawson MRN: [id number] Date of Birth: Mar 27, 1940 Referring Provider:   [name] Pulmonary Rehab Walk Test from 09/14/2015 in [place]  Referring Provider  Dr. Halford Chessman      Encounter Date: 09/30/2015  Check In:     Session Check In - 09/30/15 1027      Check-In   Location [facility]   Staff Present Su Hilt, MS, ACSM RCEP, Exercise Physiologist;Joan Leonia Reeves, Therapist, sports, BSN;Ramon Dredge, RN, MHA;Roxan Yamamoto Rollene Rotunda, RN, BSN   Supervising physician immediately available to respond to emergencies [facility] immediately available   Physician(s) Dr. Allyson Sabal   Medication changes reported     No   Fall or balance concerns reported    No   Warm-up and Cool-down Performed as group-led instruction   Resistance Training Performed Yes   VAD Patient? No     Pain Assessment   Currently in Pain? No/denies   Multiple Pain Sites No      Capillary Blood Glucose: No results found for this or any previous visit (from the past 24 hour(s)).      Exercise Prescription Changes - 09/30/15 1224      Response to Exercise   Blood Pressure (Admit) 100/60   Blood Pressure (Exercise) 110/60   Blood Pressure (Exit) 98/50   Heart Rate (Admit) 60 bpm   Heart Rate (Exercise) 78 bpm   Heart Rate (Exit) 62 bpm   Oxygen Saturation (Admit) 96 %   Oxygen Saturation (Exercise) 91 %   Oxygen Saturation (Exit) 95 %   Rating of Perceived Exertion (Exercise) 13   Perceived Dyspnea (Exercise) 1   Duration Progress to 45 minutes of aerobic exercise without signs/symptoms of physical distress   Intensity THRR unchanged     Progression   Progression Continue to progress workloads to maintain intensity without signs/symptoms of physical distress.     Resistance Training   Training Prescription Yes   Weight blue bands   Reps 10-12  10 minutes of strength training     Interval Training   Interval Training No     Oxygen   Oxygen Continuous   Liters 2     NuStep   Level 3   Minutes 17   METs 2     Track   Laps 15   Minutes 17     Goals Met:  Improved SOB with ADL's Using PLB without cueing & demonstrates good technique Exercise tolerated well No report of cardiac concerns or symptoms Strength training completed today  Goals Unmet:  Not Applicable  Comments: Service time is from 1030 to 1210   Dr. Rush Farmer is Medical Director for Pulmonary Rehab at [facility].

## 2015-10-05 ENCOUNTER — Telehealth: Payer: Self-pay | Admitting: Cardiology

## 2015-10-05 ENCOUNTER — Encounter (HOSPITAL_COMMUNITY)
Admission: RE | Admit: 2015-10-05 | Discharge: 2015-10-05 | Disposition: A | Discharge status: Home / Self Care | Payer: Medicare Other | Source: Ambulatory Visit | Attending: Pulmonary Disease | Admitting: Pulmonary Disease

## 2015-10-05 VITALS — Wt 283.1 lb

## 2015-10-05 DIAGNOSIS — Z6841 Body Mass Index (BMI) 40.0 and over, adult: Secondary | ICD-10-CM | POA: Diagnosis not present

## 2015-10-05 DIAGNOSIS — J9611 Chronic respiratory failure with hypoxia: Secondary | ICD-10-CM

## 2015-10-05 DIAGNOSIS — R0609 Other forms of dyspnea: Secondary | ICD-10-CM | POA: Diagnosis not present

## 2015-10-05 DIAGNOSIS — G4733 Obstructive sleep apnea (adult) (pediatric): Secondary | ICD-10-CM | POA: Diagnosis not present

## 2015-10-05 DIAGNOSIS — J4530 Mild persistent asthma, uncomplicated: Secondary | ICD-10-CM | POA: Diagnosis not present

## 2015-10-05 DIAGNOSIS — I5032 Chronic diastolic (congestive) heart failure: Secondary | ICD-10-CM

## 2015-10-05 DIAGNOSIS — R5381 Other malaise: Secondary | ICD-10-CM | POA: Diagnosis not present

## 2015-10-05 NOTE — Telephone Encounter (Signed)
New Message  Porsha from Cardiac Rehab voiced pt is in [facility] rehab with 2.5 kilo weght gain increase since thurs.  Porsha voiced pt has 3 crakles in bases and more sever sob around sat during lunch time.   Porsha voiced pt is taking meds as prescrib. 80 mg lasix morn. and 40 mg afternoon. 25 mg aldactone daily.   Porsha voiced pt is not going to continue exercising.  Porsha voiced to reach back out to her at [phone].  Please f/u with Porsha.

## 2015-10-05 NOTE — Telephone Encounter (Signed)
Late entry, call time of 12:00pm  Spoke to [facility] at [place]. Notes she saw patient in Pulmonary Rehab today, pt of Dr. Martinique. Pt experienced wt gain of 2.5kg since Thursday. On Saturday, he had a birthday, reports he ate hot dog, hamburger, vegetable soup (high sodium intake), but had noted to [facility] that he'd been experiencing some shortness of breat prior to that. She noted swelling, 3+ pitting edema in lower extremities, as well as non-pitting edema. Pt takes 80mg  lasix daily in AM, 40mg  in PM, and 25mg  aldactone.  She notes he is stable for discharge from [facility] today. Ambulatory, no DOE or fatigue noted. She has given patient instructions on when to seek emergent care.  I advised to make use of on-call services in hospital if needed, but if patient declined/wished to go home and she felt stable, OK to send home. Last BMET stable.  Advised additionally that patient should take extra 40mg  lasix this afternoon, repeat dose increase tomorrow, c Have patient call office tomorrow or Thursday. Will route to [facility] for additional consultation today, if required. O/w will get update from patient when he calls this week. Porsha to assess patient when he returns for scheduled Pulm Rehab visit for appropriate resumption of services.

## 2015-10-05 NOTE — Progress Notes (Signed)
Daily Session Note  Patient Details  Name: Carlos Dawson MRN: [id number] Date of Birth: 09/07/1940 Referring Provider:   Flowsheet Row Pulmonary Rehab Walk Test from 09/14/2015 in [facility]  Referring Provider  Dr. Sood      Encounter Date: 10/05/2015  Check In:     Session Check In - 10/05/15 1025      Check-In   Location [facility]   Staff Present Molly diVincenzo, MS, ACSM RCEP, Exercise Physiologist;Lisa Hughes, RN;Portia Payne, RN, BSN;Annedrea Stackhouse, RN, MHA   Supervising physician immediately available to respond to emergencies [facility] immediately available   Physician(s) Dr. Merrell   Medication changes reported     No   Fall or balance concerns reported    No   Warm-up and Cool-down Performed as group-led instruction   Resistance Training Performed Yes   VAD Patient? No     Pain Assessment   Currently in Pain? No/denies   Multiple Pain Sites No      Capillary Blood Glucose: No results found for this or any previous visit (from the past 24 hour(s)).      Exercise Prescription Changes - 10/05/15 1200      Response to Exercise   Blood Pressure (Admit) 110/60   Blood Pressure (Exit) 98/64   Heart Rate (Admit) 68 bpm   Heart Rate (Exit) 59 bpm   Oxygen Saturation (Admit) 97 %   Oxygen Saturation (Exit) 95 %   Rating of Perceived Exertion (Exercise) 13   Perceived Dyspnea (Exercise) 3     Oxygen   Oxygen Continuous   Liters 2     Track   Laps 11   Minutes 17     Goals Met:  Independence with exercise equipment Strength training completed today  Goals Unmet:  RPE PD  Comments: Service time is from 1030 to 1200. Patient complained of more severe dyspnea with walking today. Stated onset of worsening SOB began Saturday. Patient described high sodium diet since picnic Saturday. Compliant with all medications. Faint, fine crackles noted in base of lungs bilat. Exp wheeze left lower. 3+  pitting edema to left lower leg, non pitting to right. Spoke with Nathan, RN with cardiology. Orders given for patient to take an additional 40 mg of lasix today and tomorrow. Patient also instructed to check in with cards office on Thursday. HF education completed with patient and wife. Patient given low NA diet handout in addition to common foods with NA content. Patient verbalized understanding. Patient also instructed to take BP and if low or patient has symptoms of low BP he is to call MD tomorrow before he takes additional lasix. Will follow up with patient at Thursday's appointment.   Dr. Wesam G. Yacoub is Medical Director for Pulmonary Rehab at [facility]. 

## 2015-10-07 ENCOUNTER — Ambulatory Visit: Payer: Medicare Other | Admitting: Cardiothoracic Surgery

## 2015-10-07 ENCOUNTER — Encounter (HOSPITAL_COMMUNITY)
Admission: RE | Admit: 2015-10-07 | Discharge: 2015-10-07 | Disposition: A | Discharge status: Home / Self Care | Payer: Medicare Other | Source: Ambulatory Visit | Attending: Pulmonary Disease | Admitting: Pulmonary Disease

## 2015-10-07 VITALS — Wt 280.2 lb

## 2015-10-07 DIAGNOSIS — G4733 Obstructive sleep apnea (adult) (pediatric): Secondary | ICD-10-CM | POA: Diagnosis not present

## 2015-10-07 DIAGNOSIS — J4530 Mild persistent asthma, uncomplicated: Secondary | ICD-10-CM | POA: Diagnosis not present

## 2015-10-07 DIAGNOSIS — R0609 Other forms of dyspnea: Secondary | ICD-10-CM | POA: Diagnosis not present

## 2015-10-07 DIAGNOSIS — R5381 Other malaise: Secondary | ICD-10-CM | POA: Diagnosis not present

## 2015-10-07 DIAGNOSIS — J9611 Chronic respiratory failure with hypoxia: Secondary | ICD-10-CM

## 2015-10-07 DIAGNOSIS — Z6841 Body Mass Index (BMI) 40.0 and over, adult: Secondary | ICD-10-CM | POA: Diagnosis not present

## 2015-10-07 NOTE — Telephone Encounter (Signed)
Returned call to patient no answer.LMTC. 

## 2015-10-07 NOTE — Progress Notes (Signed)
Daily Session Note  Patient Details  Name: Carlos Dawson MRN: [id number] Date of Birth: 1940/04/23 Referring Provider:   [name] Pulmonary Rehab Walk Test from 09/14/2015 in [place]  Referring Provider  Dr. Halford Chessman      Encounter Date: 10/07/2015  Check In:     Session Check In - 10/07/15 1022      Check-In   Location [facility]   Staff Present Rosebud Poles, RN, BSN;Dimond Crotty, MS, ACSM RCEP, Exercise Physiologist;Lisa Ysidro Evert, Felipe Drone, RN, MHA;Portia Rollene Rotunda, RN, BSN   Supervising physician immediately available to respond to emergencies [facility] immediately available   Physician(s) Dr. Waldron Labs   Medication changes reported     No   Fall or balance concerns reported    No   Warm-up and Cool-down Performed as group-led instruction   Resistance Training Performed Yes   VAD Patient? No     Pain Assessment   Currently in Pain? No/denies   Multiple Pain Sites No      Capillary Blood Glucose: No results found for this or any previous visit (from the past 24 hour(s)).     POCT Glucose - 10/07/15 1210      POCT Blood Glucose   Pre-Exercise 231 mg/dL   Post-Exercise 208 mg/dL         Exercise Prescription Changes - 10/07/15 1200      Exercise Review   Progression Yes     Response to Exercise   Blood Pressure (Admit) 94/50   Blood Pressure (Exercise) 110/64   Blood Pressure (Exit) 98/64   Heart Rate (Admit) 57 bpm   Heart Rate (Exercise) 64 bpm   Heart Rate (Exit) 61 bpm   Oxygen Saturation (Admit) 96 %   Oxygen Saturation (Exercise) 89 %   Oxygen Saturation (Exit) 93 %   Rating of Perceived Exertion (Exercise) 12   Perceived Dyspnea (Exercise) 1   Duration Progress to 45 minutes of aerobic exercise without signs/symptoms of physical distress   Intensity THRR unchanged     Progression   Progression Continue progressive overload as per policy without signs/symptoms or  physical distress.     Resistance Training   Training Prescription Yes   Weight blue bands   Reps 10-12     Interval Training   Interval Training No     Oxygen   Oxygen Continuous   Liters 2     Arm Ergometer   Level 3   Minutes 17     Track   Laps 10   Minutes 17     Goals Met:  Exercise tolerated well No report of cardiac concerns or symptoms Strength training completed today  Goals Unmet:  Not Applicable  Comments: Service time is from 10:30am to 12:05pm    Dr. Rush Farmer is Medical Director for Pulmonary Rehab at [facility]

## 2015-10-07 NOTE — Telephone Encounter (Signed)
Received call back from patient.He stated he has took a extra 40 mg lasix for the past 2 days.Stated he lost 2 lbs.He still has some swelling in ankles and feet.Advised he can take another extra lasix 40 mg this afternoon and then resume normal dose.Stated sob better today.Advised to avoid salt.Advised needs to have bmet in 1 week.Stated he is on the way to the beach he will have done when he returns 10/14/15.

## 2015-10-07 NOTE — Telephone Encounter (Signed)
F/U  Patient calling for response to his call from yesterday

## 2015-10-07 NOTE — Progress Notes (Signed)
Pulmonary Individual Treatment Plan  Patient Details  Name: Carlos Dawson MRN: [id number] Date of Birth: 05-29-1940 Referring Provider:   [name] Pulmonary Rehab Walk Test from 09/14/2015 in [place]  Referring Provider  Dr. Halford Chessman      Initial Encounter Date:  Flowsheet Row Pulmonary Rehab Walk Test from 09/14/2015 in [place]  Date  09/14/15  Referring Provider  Dr. Halford Chessman      Visit Diagnosis: Chronic respiratory failure with hypoxia ([place])  Patient's Home Medications on Admission:   Current Outpatient Prescriptions:  .  acetaminophen (TYLENOL) 500 MG tablet, Take 500 mg by mouth as needed., Disp: , Rfl:  .  albuterol (PROAIR HFA) 108 (90 Base) MCG/ACT inhaler, 2 puffs every 4 hours as needed only  if your can't catch your breath (Patient taking differently: 2 puffs every 4 hours as needed for wheezing and shortness of breath), Disp: 1 Inhaler, Rfl: 11 .  atenolol (TENORMIN) 25 MG tablet, Take 25 mg by mouth every morning. , Disp: , Rfl:  .  atorvastatin (LIPITOR) 20 MG tablet, Take 1 tablet (20 mg total) by mouth daily. (Patient taking differently: Take 10 mg by mouth daily. ), Disp: 90 tablet, Rfl: 3 .  budesonide-formoterol (SYMBICORT) 160-4.5 MCG/ACT inhaler, Inhale 2 puffs into the lungs 2 (two) times daily., Disp: 1 Inhaler, Rfl: 6 .  celecoxib (CELEBREX) 200 MG capsule, Take 200 mg by mouth 2 (two) times daily., Disp: , Rfl:  .  dextromethorphan-guaiFENesin (MUCINEX DM) 30-600 MG 12hr tablet, Take by mouth as needed., Disp: , Rfl:  .  fluticasone (FLONASE) 50 MCG/ACT nasal spray, Place into both nostrils daily as needed (nasal congestion)., Disp: , Rfl:  .  fluticasone (FLOVENT HFA) 110 MCG/ACT inhaler, Inhale into the lungs., Disp: , Rfl:  .  furosemide (LASIX) 80 MG tablet, Take 1 tablet (80 mg total) by mouth daily. Take 80 mg in the morning and 40 mg in the afternoon., Disp: , Rfl:  .  KLOR-CON M20 20  MEQ tablet, TAKE 2 TABLETS (40 MEQ TOTAL) BY MOUTH 2 (TWO) TIMES DAILY., Disp: 360 tablet, Rfl: 2 .  losartan (COZAAR) 100 MG tablet, Take 50 mg by mouth at bedtime. , Disp: , Rfl:  .  metFORMIN (GLUCOPHAGE) 500 MG tablet, Take 500 mg by mouth daily with breakfast. , Disp: , Rfl:  .  montelukast (SINGULAIR) 10 MG tablet, Take 10 mg by mouth at bedtime., Disp: , Rfl:  .  nitroGLYCERIN (NITROSTAT) 0.4 MG SL tablet, Place 1 tablet (0.4 mg total) under the tongue every 5 (five) minutes as needed for chest pain., Disp: 25 tablet, Rfl: 6 .  omeprazole (PRILOSEC) 20 MG capsule, Take 20 mg by mouth daily before breakfast., Disp: , Rfl:  .  OXYGEN, Inhale into the lungs. Use CPAP at bedtime, Disp: , Rfl:  .  rivaroxaban (XARELTO) 20 MG TABS tablet, Take 20 mg by mouth., Disp: , Rfl:  .  sildenafil (VIAGRA) 100 MG tablet, Take 100 mg by mouth., Disp: , Rfl:  .  spironolactone (ALDACTONE) 25 MG tablet, Take 25 mg by mouth at bedtime. , Disp: , Rfl:  .  tamsulosin (FLOMAX) 0.4 MG CAPS, Take 0.4 mg by mouth daily., Disp: , Rfl:  .  tiZANidine (ZANAFLEX) 4 MG tablet, Take 4 mg by mouth 3 (three) times daily as needed., Disp: , Rfl: 2 .  topiramate (TOPAMAX) 25 MG capsule, Take 2 tablets by mouth twice a day, Disp: ,  Rfl:  .  traMADol (ULTRAM) 50 MG tablet, Take 50 mg by mouth., Disp: , Rfl:   Past Medical History: Past Medical History:  Diagnosis Date  . Anticoagulant long-term use   . Aortic root enlargement ([place])   . Ascending aortic aneurysm [facility])    recent scan in October 2012 showing no change; followed by Dr. Servando Snare  . ASCVD (arteriosclerotic cardiovascular disease)    Prior BMS to the 2nd OM in September 2012; with repeat cath in October showing patency  . CAD (coronary artery disease)    a. s/p BMS to 2nd OM in Sept 2012; b. LexiScan Myoview (12/2012):  Inf infarct; bowel and motion artifact make study difficult to interpret; no ischemia; not gated; Low Risk  . CHF (congestive heart failure)  ([place])    no recent issues 10/13/14  . Colonic polyp   . Contact lens/glasses fitting   . Diabetes mellitus without complication (HCC)    metphormin, average 154  . Diastolic dysfunction   . Generalized headaches    neck stenosis  . GERD (gastroesophageal reflux disease)   . Hearing loss   . Hemorrhoids   . Hypertension   . IBS (irritable bowel syndrome)   . LVH (left ventricular hypertrophy)   . Mild intermittent asthma   . OA (osteoarthritis)   . Obesities, morbid ([place])   . OSA (obstructive sleep apnea)    PSG 03/30/97 AHI 21, BPAP 13/9  . PAF (paroxysmal atrial fibrillation) ([place])    treated with Coumadin  . Prostate [facility])    Oncologist  DR. Daralene Mil[facility] 09/24/14, undetermined tx   . Pulmonary embolism ([place])    2008  . Sleep apnea   . SOB (shortness of breath)    on excertion  . Thoracic aortic aneurysm (HCC)    Aortic Size Index=     5.0    /Body surface area is 2.43 meters squared. = 2.05  < 2.75 cm/m2      4% risk per year 2.75 to 4.25          8% risk per year > 4.25 cm/m2    20% risk per year   Stable aneurysmal dilation of the ascending aorta with maximum AP diameter of 4.8 cm. Stable area of narrowing of the proximal most portion of the descending aorta measuring 2 cm., previously identified as an area of coarctation. No evidence of aortic dissection.  Coronary artery disease.  Normal appearance of the lungs.   Electronically Signed   By: Fidela Salisbury M.D.   On: 10/01/2014 08:50      Tobacco Use: History  Smoking Status  . Former Smoker  . Packs/day: 1.50  . Years: 30.00  . Quit date: 01/24/1992  Smokeless Tobacco  . Never Used    Comment: Stopped in 1983    Labs: Recent Review Flowsheet Data    Labs for ITP Cardiac and Pulmonary Rehab Latest Ref Rng & Units 02/26/2010 11/20/2011 01/07/2013 05/07/2014 05/07/2014   Cholestrol 0 - 200 mg/dL - 124 118 - -   LDLCALC 0 - 99 mg/dL - 62 51 - -   HDL >39 mg/dL - 41.10 40 - -   Trlycerides <150 mg/dL - 104.0  133 - -   PHART 7.350 - 7.450 - - - - 7.336(L)   PCO2ART 35.0 - 45.0 mmHg - - - - 42.3   HCO3 20.0 - 24.0 mEq/L - - - 23.0 22.6   TCO2 0 - 100 mmol/L 25 - -  24 24   ACIDBASEDEF 0.0 - 2.0 mmol/L - - - 3.0(H) 3.0(H)   O2SAT % - - - 61.0 91.0      Capillary Blood Glucose: Lab Results  Component Value Date   GLUCAP 116 (H) 12/29/2014   GLUCAP 113 (H) 05/07/2014   GLUCAP 135 (H) 05/07/2014   GLUCAP 95 01/28/2013   GLUCAP 97 06/01/2012       POCT Glucose    Row Name 09/21/15 1212 09/23/15 1211 09/28/15 1227 09/30/15 1227       POCT Blood Glucose   Pre-Exercise 176 mg/dL 218 mg/dL 172 mg/dL 212 mg/dL    Post-Exercise 137 mg/dL 159 mg/dL 144 mg/dL 148 mg/dL       ADL UCSD:     Pulmonary Assessment Scores    Row Name 09/15/15 1600         ADL UCSD   ADL Phase Entry     SOB Score total 41        Pulmonary Function Assessment:     Pulmonary Function Assessment - 09/13/15 1106      Breath   Bilateral Breath Sounds Clear      Exercise Target Goals:    Exercise Program Goal: Individual exercise prescription set with THRR, safety & activity barriers. Participant demonstrates ability to understand and report RPE using BORG scale, to self-measure pulse accurately, and to acknowledge the importance of the exercise prescription.  Exercise Prescription Goal: Starting with aerobic activity 30 plus minutes a day, 3 days per week for initial exercise prescription. Provide home exercise prescription and guidelines that participant acknowledges understanding prior to discharge.  Activity Barriers & Risk Stratification:     Activity Barriers & Cardiac Risk Stratification - 09/13/15 1104      Activity Barriers & Cardiac Risk Stratification   Activity Barriers Deconditioning;Shortness of Breath;Back Problems;Neck/Spine Problems      6 Minute Walk:     6 Minute Walk    Row Name 09/14/15 1626         6 Minute Walk   Phase Initial     Distance 1210 feet     Walk  Time 6 minutes     # of Rest Breaks 0     MPH 2.29     METS 2.68     RPE 13     Perceived Dyspnea  2     Symptoms Yes (comment)     Comments leg fatigue     Resting HR 75 bpm     Resting BP 120/60     Max Ex. HR 104 bpm     Max Ex. BP 148/62     2 Minute Post BP 126/60       Interval HR   Baseline HR 75     1 Minute HR 85     2 Minute HR 92     3 Minute HR 93     4 Minute HR 97     5 Minute HR 104     6 Minute HR 94     2 Minute Post HR 85     Interval Heart Rate? Yes       Interval Oxygen   Interval Oxygen? Yes     Baseline Oxygen Saturation % 96 %     Baseline Liters of Oxygen 2 L     1 Minute Oxygen Saturation % 95 %     1 Minute Liters of Oxygen 2 L     2 Minute Oxygen Saturation %  93 %     2 Minute Liters of Oxygen 2 L     3 Minute Oxygen Saturation % 91 %     3 Minute Liters of Oxygen 2 L     4 Minute Oxygen Saturation % 91 %     4 Minute Liters of Oxygen 2 L     5 Minute Oxygen Saturation % 90 %     5 Minute Liters of Oxygen 2 L     6 Minute Oxygen Saturation % 90 %     6 Minute Liters of Oxygen 2 L     2 Minute Post Oxygen Saturation % 93 %     2 Minute Post Liters of Oxygen 2 L        Initial Exercise Prescription:     Initial Exercise Prescription - 09/14/15 1600      Date of Initial Exercise RX and Referring Provider   Date 09/14/15   Referring Provider Dr. Halford Chessman     Oxygen   Oxygen Continuous   Liters 2     NuStep   Level 1   Minutes 17   METs 1.5     Arm Ergometer   Level 1   Minutes 17     Track   Laps 5   Minutes 17     Prescription Details   Frequency (times per week) 2   Duration Progress to 45 minutes of aerobic exercise without signs/symptoms of physical distress     Intensity   THRR 40-80% of Max Heartrate 59-118   Ratings of Perceived Exertion 11-13   Perceived Dyspnea 0-4     Progression   Progression Continue progressive overload as per policy without signs/symptoms or physical distress.     Resistance Training    Training Prescription Yes   Weight blue bands   Reps 10-12      Perform Capillary Blood Glucose checks as needed.  Exercise Prescription Changes:     Exercise Prescription Changes    Row Name 09/21/15 1200 09/23/15 1200 09/28/15 1200 09/30/15 1224 10/05/15 1200     Exercise Review   Progression  - Yes  -  -  -     Response to Exercise   Blood Pressure (Admit) 102/66 110/52 104/56 100/60 110/60   Blood Pressure (Exercise) 132/100 112/54 120/62 110/60  -   Blood Pressure (Exit) 110/60 104/50 1'00/58 98/50 98/64 '   Heart Rate (Admit) 62 bpm 59 bpm 58 bpm 60 bpm 68 bpm   Heart Rate (Exercise) 89 bpm 79 bpm 88 bpm 78 bpm  -   Heart Rate (Exit) 70 bpm 65 bpm 66 bpm 62 bpm 59 bpm   Oxygen Saturation (Admit) 93 % 97 % 95 % 96 % 97 %   Oxygen Saturation (Exercise) 88 % 93 % 91 % 91 %  -   Oxygen Saturation (Exit) 95 % 93 % 92 % 95 % 95 %   Rating of Perceived Exertion (Exercise) 12  -  - 13 13   Perceived Dyspnea (Exercise) 1  -  - 1 3   Duration Progress to 45 minutes of aerobic exercise without signs/symptoms of physical distress Progress to 45 minutes of aerobic exercise without signs/symptoms of physical distress Progress to 45 minutes of aerobic exercise without signs/symptoms of physical distress Progress to 45 minutes of aerobic exercise without signs/symptoms of physical distress  -   Intensity Other (comment)  HRR 40-80% THRR unchanged THRR unchanged THRR unchanged  -  Progression   Progression Continue to progress workloads to maintain intensity without signs/symptoms of physical distress. Continue to progress workloads to maintain intensity without signs/symptoms of physical distress. Continue to progress workloads to maintain intensity without signs/symptoms of physical distress. Continue to progress workloads to maintain intensity without signs/symptoms of physical distress.  -     Resistance Training   Training Prescription Yes Yes Yes Yes  -   Weight blue bands blue  bands blue bands blue bands  -   Reps 10-12 10-12  10 minutes of strength training 10-12  10 minutes of strength training 10-12  10 minutes of strength training  -     Interval Training   Interval Training  -  - No No  -     Oxygen   Oxygen Continuous Continuous Continuous Continuous Continuous   Liters '2 2 2 2 2     ' NuStep   Level 2  - 2 3  -   Minutes 17  - 17 17  -   METs 1.7  - 1.8 2  -     Arm Ergometer   Level '1 2 1  ' -  -   Minutes '17 17 17  ' -  -     Track   Laps '13 11 16 15 11   ' Minutes '17 17 17 17 17      ' Exercise Comments:     Exercise Comments    Row Name 10/04/15 1137           Exercise Comments Patient has completed 4 sessons. Two workload increases. Will cont. to monitor.           Discharge Exercise Prescription (Final Exercise Prescription Changes):     Exercise Prescription Changes - 10/05/15 1200      Response to Exercise   Blood Pressure (Admit) 110/60   Blood Pressure (Exit) 98/64   Heart Rate (Admit) 68 bpm   Heart Rate (Exit) 59 bpm   Oxygen Saturation (Admit) 97 %   Oxygen Saturation (Exit) 95 %   Rating of Perceived Exertion (Exercise) 13   Perceived Dyspnea (Exercise) 3     Oxygen   Oxygen Continuous   Liters 2     Track   Laps 11   Minutes 17       Nutrition:  Target Goals: Understanding of nutrition guidelines, daily intake of sodium <1536m, cholesterol <2045m calories 30% from fat and 7% or less from saturated fats, daily to have 5 or more servings of fruits and vegetables.  Biometrics:     Pre Biometrics - 09/13/15 1112      Pre Biometrics   Grip Strength 40 kg       Nutrition Therapy Plan and Nutrition Goals:   Nutrition Discharge: Rate Your Plate Scores:   Psychosocial: Target Goals: Acknowledge presence or absence of depression, maximize coping skills, provide positive support system. Participant is able to verbalize types and ability to use techniques and skills needed for reducing stress and  depression.  Initial Review & Psychosocial Screening:     Initial Psych Review & Screening - 09/13/15 [id number]     Barriers   Psychosocial barriers to participate in program There are no identifiable barriers or psychosocial needs.     Screening Interventions   Interventions Encouraged to exercise      Quality of Life Scores:     Quality of Life - 09/15/15 1601  Quality of Life Scores   Health/Function Pre 22 %   Socioeconomic Pre 28.29 %   Psych/Spiritual Pre 27.43 %   Family Pre 24 %   GLOBAL Pre 24.73 %      PHQ-9: Recent Review Flowsheet Data    Depression screen [facility] 2/9 09/13/2015   Decreased Interest 0   Down, Depressed, Hopeless 0   PHQ - 2 Score 0      Psychosocial Evaluation and Intervention:     Psychosocial Evaluation - 09/13/15 1110      Psychosocial Evaluation & Interventions   Interventions Encouraged to exercise with the program and follow exercise prescription      Psychosocial Re-Evaluation:     Psychosocial Re-Evaluation    [place] Name 10/05/15 0768             Psychosocial Re-Evaluation   Interventions Encouraged to attend Pulmonary Rehabilitation for the exercise       Comments there are no psychosocial barriers identified in the last 30 days         Education: Education Goals: Education classes will be provided on a weekly basis, covering required topics. Participant will state understanding/return demonstration of topics presented.  Learning Barriers/Preferences:     Learning Barriers/Preferences - 09/13/15 1105      Learning Barriers/Preferences   Learning Barriers None   Learning Preferences Written Material;Individual Instruction;Group Instruction;Computer/Internet      Education Topics: Risk Factor Reduction:  -Group instruction that is supported by a PowerPoint presentation. Instructor discusses the definition of a risk factor, different risk factors for pulmonary  disease, and how the heart and lungs work together.     Nutrition for Pulmonary Patient:  -Group instruction provided by PowerPoint slides, verbal discussion, and written materials to support subject matter. The instructor gives an explanation and review of healthy diet recommendations, which includes a discussion on weight management, recommendations for fruit and vegetable consumption, as well as protein, fluid, caffeine, fiber, sodium, sugar, and alcohol. Tips for eating when patients are short of breath are discussed.   Pursed Lip Breathing:  -Group instruction that is supported by demonstration and informational handouts. Instructor discusses the benefits of pursed lip and diaphragmatic breathing and detailed demonstration on how to preform both.   Flowsheet Row PULMONARY REHAB OTHER RESPIRATORY from 09/30/2015 in [place]  Date  09/30/15  Educator  RT  Instruction Review Code  2- meets goals/outcomes      Oxygen Safety:  -Group instruction provided by PowerPoint, verbal discussion, and written material to support subject matter. There is an overview of "What is Oxygen" and "Why do we need it".  Instructor also reviews how to create a safe environment for oxygen use, the importance of using oxygen as prescribed, and the risks of noncompliance. There is a brief discussion on traveling with oxygen and resources the patient may utilize.   Oxygen Equipment:  -Group instruction provided by [facility] Staff utilizing handouts, written materials, and equipment demonstrations.   Signs and Symptoms:  -Group instruction provided by written material and verbal discussion to support subject matter. Warning signs and symptoms of infection, stroke, and heart attack are reviewed and when to call the physician/911 reinforced. Tips for preventing the spread of infection discussed.   Advanced Directives:  -Group instruction provided by verbal instruction and written  material to support subject matter. Instructor reviews Advanced Directive laws and proper instruction for filling out document.   Pulmonary Video:  -Group video education that reviews the importance  of medication and oxygen compliance, exercise, good nutrition, pulmonary hygiene, and pursed lip and diaphragmatic breathing for the pulmonary patient. Flowsheet Row PULMONARY REHAB OTHER RESPIRATORY from 09/30/2015 in [place]  Date  09/23/15  Instruction Review Code  2- meets goals/outcomes      Exercise for the Pulmonary Patient:  -Group instruction that is supported by a PowerPoint presentation. Instructor discusses benefits of exercise, core components of exercise, frequency, duration, and intensity of an exercise routine, importance of utilizing pulse oximetry during exercise, safety while exercising, and options of places to exercise outside of rehab.     Pulmonary Medications:  -Verbally interactive group education provided by instructor with focus on inhaled medications and proper administration.   Anatomy and Physiology of the Respiratory System and Intimacy:  -Group instruction provided by PowerPoint, verbal discussion, and written material to support subject matter. Instructor reviews respiratory cycle and anatomical components of the respiratory system and their functions. Instructor also reviews differences in obstructive and restrictive respiratory diseases with examples of each. Intimacy, Sex, and Sexuality differences are reviewed with a discussion on how relationships can change when diagnosed with pulmonary disease. Common sexual concerns are reviewed.   Knowledge Questionnaire Score:     Knowledge Questionnaire Score - 09/15/15 1600      Knowledge Questionnaire Score   Pre Score 6/13      Core Components/Risk Factors/Patient Goals at Admission:     Personal Goals and Risk Factors at Admission - 09/13/15 1108      Core  Components/Risk Factors/Patient Goals on Admission    Weight Management Yes;Obesity   Intervention Weight Management: Develop a combined nutrition and exercise program designed to reach desired caloric intake, while maintaining appropriate intake of nutrient and fiber, sodium and fats, and appropriate energy expenditure required for the weight goal.;Weight Management: Provide education and appropriate resources to help participant work on and attain dietary goals.;Weight Management/Obesity: Establish reasonable short term and long term weight goals.;Obesity: Provide education and appropriate resources to help participant work on and attain dietary goals.   Expected Outcomes Short Term: Continue to assess and modify interventions until short term weight is achieved;Weight Loss: Understanding of general recommendations for a balanced deficit meal plan, which promotes 1-2 lb weight loss per week and includes a negative energy balance of [phone] kcal/d;Understanding recommendations for meals to include 15-35% energy as protein, 25-35% energy from fat, 35-60% energy from carbohydrates, less than 215m of dietary cholesterol, 20-35 gm of total fiber daily;Understanding of distribution of calorie intake throughout the day with the consumption of 4-5 meals/snacks   Increase Strength and Stamina Yes   Intervention Provide advice, education, support and counseling about physical activity/exercise needs.;Develop an individualized exercise prescription for aerobic and resistive training based on initial evaluation findings, risk stratification, comorbidities and participant's personal goals.   Expected Outcomes Achievement of increased cardiorespiratory fitness and enhanced flexibility, muscular endurance and strength shown through measurements of functional capacity and personal statement of participant.   Improve shortness of breath with ADL's Yes   Intervention Provide education, individualized exercise plan and  daily activity instruction to help decrease symptoms of SOB with activities of daily living.   Expected Outcomes Short Term: Achieves a reduction of symptoms when performing activities of daily living.   Develop more efficient breathing techniques such as purse lipped breathing and diaphragmatic breathing; and practicing self-pacing with activity Yes   Intervention Provide education, demonstration and support about specific breathing techniuqes utilized for more efficient breathing. Include techniques such  as pursed lipped breathing, diaphragmatic breathing and self-pacing activity.   Expected Outcomes Short Term: Participant will be able to demonstrate and use breathing techniques as needed throughout daily activities.      Core Components/Risk Factors/Patient Goals Review:      Goals and Risk Factor Review    Row Name 10/05/15 0831             Core Components/Risk Factors/Patient Goals Review   Personal Goals Review Weight Management/Obesity;Increase Strength and Stamina;Develop more efficient breathing techniques such as purse lipped breathing and diaphragmatic breathing and practicing self-pacing with activity.;Improve shortness of breath with ADL's       Review see comments section on ITP       Expected Outcomes see admission expected outcomes          Core Components/Risk Factors/Patient Goals at Discharge (Final Review):      Goals and Risk Factor Review - 10/05/15 0831      Core Components/Risk Factors/Patient Goals Review   Personal Goals Review Weight Management/Obesity;Increase Strength and Stamina;Develop more efficient breathing techniques such as purse lipped breathing and diaphragmatic breathing and practicing self-pacing with activity.;Improve shortness of breath with ADL's   Review see comments section on ITP   Expected Outcomes see admission expected outcomes      ITP Comments:   Comments: ITP REVIEW Pt is making expected progress toward pulmonary rehab  goals after completing 5 sessions. Recommend continued exercise, life style modification, education, and utilization of breathing techniques to increase stamina and strength and decrease shortness of breath with exertion.

## 2015-10-08 NOTE — Telephone Encounter (Signed)
agree

## 2015-10-12 ENCOUNTER — Encounter (HOSPITAL_COMMUNITY): Payer: Medicare Other

## 2015-10-14 ENCOUNTER — Ambulatory Visit
Admission: RE | Admit: 2015-10-14 | Discharge: 2015-10-14 | Disposition: A | Discharge status: Home / Self Care | Payer: Medicare Other | Source: Ambulatory Visit | Attending: Cardiothoracic Surgery | Admitting: Cardiothoracic Surgery

## 2015-10-14 ENCOUNTER — Encounter (HOSPITAL_COMMUNITY)
Admission: RE | Admit: 2015-10-14 | Discharge: 2015-10-14 | Disposition: A | Discharge status: Home / Self Care | Payer: Medicare Other | Source: Ambulatory Visit | Attending: Pulmonary Disease | Admitting: Pulmonary Disease

## 2015-10-14 ENCOUNTER — Ambulatory Visit: Payer: Medicare Other | Admitting: Cardiothoracic Surgery

## 2015-10-14 DIAGNOSIS — I712 Thoracic aortic aneurysm, without rupture, unspecified: Secondary | ICD-10-CM

## 2015-10-14 DIAGNOSIS — J9611 Chronic respiratory failure with hypoxia: Secondary | ICD-10-CM

## 2015-10-14 DIAGNOSIS — I5032 Chronic diastolic (congestive) heart failure: Secondary | ICD-10-CM | POA: Diagnosis not present

## 2015-10-14 MED ORDER — IOPAMIDOL (ISOVUE-370) INJECTION 76%
75.0000 mL | Freq: Once | INTRAVENOUS | Status: AC | PRN
  Administered 2015-10-14: 75 mL via INTRAVENOUS

## 2015-10-15 LAB — BASIC METABOLIC PANEL
BUN: 21 mg/dL (ref 7–25)
CO2: 22 mmol/L (ref 20–31)
Calcium: 9 mg/dL (ref 8.6–10.3)
Chloride: 107 mmol/L (ref 98–110)
Creat: 1.11 mg/dL (ref 0.70–1.18)
Glucose, Bld: 129 mg/dL — ABNORMAL HIGH (ref 65–99)
Potassium: 4 mmol/L (ref 3.5–5.3)
Sodium: 141 mmol/L (ref 135–146)

## 2015-10-18 DIAGNOSIS — R200 Anesthesia of skin: Secondary | ICD-10-CM | POA: Diagnosis not present

## 2015-10-18 DIAGNOSIS — G5603 Carpal tunnel syndrome, bilateral upper limbs: Secondary | ICD-10-CM | POA: Diagnosis not present

## 2015-10-18 DIAGNOSIS — M79641 Pain in right hand: Secondary | ICD-10-CM | POA: Diagnosis not present

## 2015-10-18 DIAGNOSIS — M79642 Pain in left hand: Secondary | ICD-10-CM | POA: Diagnosis not present

## 2015-10-19 ENCOUNTER — Encounter (HOSPITAL_COMMUNITY)
Admission: RE | Admit: 2015-10-19 | Discharge: 2015-10-19 | Disposition: A | Discharge status: Home / Self Care | Payer: Medicare Other | Source: Ambulatory Visit | Attending: Pulmonary Disease | Admitting: Pulmonary Disease

## 2015-10-19 VITALS — Wt 278.2 lb

## 2015-10-19 DIAGNOSIS — R5381 Other malaise: Secondary | ICD-10-CM | POA: Diagnosis not present

## 2015-10-19 DIAGNOSIS — R0609 Other forms of dyspnea: Secondary | ICD-10-CM | POA: Diagnosis not present

## 2015-10-19 DIAGNOSIS — G4733 Obstructive sleep apnea (adult) (pediatric): Secondary | ICD-10-CM | POA: Diagnosis not present

## 2015-10-19 DIAGNOSIS — Z6841 Body Mass Index (BMI) 40.0 and over, adult: Secondary | ICD-10-CM | POA: Diagnosis not present

## 2015-10-19 DIAGNOSIS — J9611 Chronic respiratory failure with hypoxia: Secondary | ICD-10-CM

## 2015-10-19 DIAGNOSIS — J4530 Mild persistent asthma, uncomplicated: Secondary | ICD-10-CM | POA: Diagnosis not present

## 2015-10-19 NOTE — Progress Notes (Signed)
Carlos Dawson 75 y.o. male Nutrition Note Spoke with pt. Pt is obese and wants to lose wt. Pt had lap band surgery in 2009. Pt reports his highest wt was 302 lb and lowest wt after lap band was ~250 lb. There are some ways the pt can make his eating habits healthier. Pt's Rate Your Plate results reviewed with pt. Pt does not avoid salty food; eats out 6-8 times/week and uses canned/ convenience food. The role of sodium in heart failure reviewed with pt. Per pt, he is a diet-controlled diabetic. Pt states he was taking Metformin but he had too many hypoglycemic episodes so Metformin discontinued.  Pt checks CBG's 1-2 times a day.  Fasting CBG's reportedly 138 -139 mg/dL before meals. Pt expressed understanding of the information reviewed via feedback method.    Nutrition Diagnosis ? Excessive sodium intake related to over consumption of processed food as evidenced by frequent consumption of convenience food/ canned vegetables and eating out frequently. ? Obesity related to excessive energy intake as evidenced by a BMI of 43.9 ?  Nutrition Intervention ? Pt's individual nutrition plan and goals reviewed with pt. ? Benefits of adopting healthy eating habits discussed when pt's Rate Your Plate reviewed. ? Handout given for Nutrition II: Making a Lifestyle Change class ? Pt to attend the Nutrition and Lung Disease class ? Continual client-centered nutrition education by RD, as part of interdisciplinary care. Goal(s) 1. Pt to identify and limit food sources of sodium. 2. Identify food quantities necessary to achieve wt loss of  -2# per week to a goal wt loss of 2.7-10.9 kg (6-24 lb) at graduation from pulmonary rehab. Monitor and Evaluate progress toward nutrition goal with team.   Derek Mound, M.Ed, RD, LDN, CDE 10/19/2015 12:03 PM

## 2015-10-19 NOTE — Progress Notes (Signed)
I have reviewed a Home Exercise Prescription with [name] . Carlos Dawson is not currently exercising at home.  The patient was advised to walk 2-3 days a week for 30 minutes.  [name] and I discussed how to progress their exercise prescription.  The patient stated that their goals were lose weight and have better oxygen saturations.  The patient stated that they understand the exercise prescription.  We reviewed exercise guidelines, target heart rate during exercise, oxygen use, weather, home pulse oximeter, endpoints for exercise, and goals.  Patient is encouraged to come to me with any questions. I will continue to follow up with the patient to assist them with progression and safety.

## 2015-10-19 NOTE — Progress Notes (Signed)
Daily Session Note  Patient Details  Name: Carlos Dawson MRN: [id number] Date of Birth: 06/12/1940 Referring Provider:   Flowsheet Row Pulmonary Rehab Walk Test from 09/14/2015 in [facility]  Referring Provider  Dr. Sood      Encounter Date: 10/19/2015  Check In:     Session Check In - 10/19/15 1020      Check-In   Location [facility]   Staff Present Molly diVincenzo, MS, ACSM RCEP, Exercise Physiologist;Joan Behrens, RN, BSN;Lisa Hughes, RN;Portia Payne, RN, BSN   Supervising physician immediately available to respond to emergencies [facility] immediately available   Physician(s) Dr. Madera   Medication changes reported     No   Fall or balance concerns reported    No   Warm-up and Cool-down Performed as group-led instruction   Resistance Training Performed Yes   VAD Patient? No     Pain Assessment   Currently in Pain? No/denies   Multiple Pain Sites No      Capillary Blood Glucose: No results found for this or any previous visit (from the past 24 hour(s)).     POCT Glucose - 10/19/15 1226      POCT Blood Glucose   Pre-Exercise 159 mg/dL   Post-Exercise 158 mg/dL         Exercise Prescription Changes - 10/19/15 1200      Exercise Review   Progression Yes     Response to Exercise   Blood Pressure (Admit) 88/50   Blood Pressure (Exercise) 98/68   Blood Pressure (Exit) 100/58   Heart Rate (Admit) 61 bpm   Heart Rate (Exercise) 70 bpm   Heart Rate (Exit) 57 bpm   Oxygen Saturation (Admit) 96 %   Oxygen Saturation (Exercise) 96 %   Oxygen Saturation (Exit) 57 %   Rating of Perceived Exertion (Exercise) 13   Perceived Dyspnea (Exercise) 3   Duration Progress to 45 minutes of aerobic exercise without signs/symptoms of physical distress   Intensity THRR unchanged     Progression   Progression Continue to progress workloads to maintain intensity without signs/symptoms of physical distress.     Resistance Training   Training Prescription Yes   Weight blue bands   Reps 10-12  10 min     Interval Training   Interval Training No     Oxygen   Oxygen Continuous   Liters 2     NuStep   Level 4   Minutes 17   METs 1.9     Arm Ergometer   Level 4   Minutes 17     Track   Laps 7  RD consult for 5 min   Minutes 10     Goals Met:  [place] with exercise equipment Improved SOB with ADL's Using PLB without cueing & demonstrates good technique Exercise tolerated well No report of cardiac concerns or symptoms Strength training completed today  Goals Unmet:  Not Applicable  Comments: Service time is from 1030 to 1210   Dr. Wesam G. Yacoub is Medical Director for Pulmonary Rehab at [facility]. 

## 2015-10-20 DIAGNOSIS — Z23 Encounter for immunization: Secondary | ICD-10-CM | POA: Diagnosis not present

## 2015-10-21 ENCOUNTER — Encounter (HOSPITAL_COMMUNITY)
Admission: RE | Admit: 2015-10-21 | Discharge: 2015-10-21 | Disposition: A | Discharge status: Home / Self Care | Payer: Medicare Other | Source: Ambulatory Visit | Attending: Pulmonary Disease | Admitting: Pulmonary Disease

## 2015-10-21 VITALS — Wt 279.1 lb

## 2015-10-21 DIAGNOSIS — Z6841 Body Mass Index (BMI) 40.0 and over, adult: Secondary | ICD-10-CM | POA: Diagnosis not present

## 2015-10-21 DIAGNOSIS — G4733 Obstructive sleep apnea (adult) (pediatric): Secondary | ICD-10-CM | POA: Diagnosis not present

## 2015-10-21 DIAGNOSIS — J4530 Mild persistent asthma, uncomplicated: Secondary | ICD-10-CM | POA: Diagnosis not present

## 2015-10-21 DIAGNOSIS — R0609 Other forms of dyspnea: Secondary | ICD-10-CM | POA: Diagnosis not present

## 2015-10-21 DIAGNOSIS — R5381 Other malaise: Secondary | ICD-10-CM | POA: Diagnosis not present

## 2015-10-21 DIAGNOSIS — J9611 Chronic respiratory failure with hypoxia: Secondary | ICD-10-CM

## 2015-10-21 NOTE — Progress Notes (Signed)
Daily Session Note  Patient Details  Name: Carlos Dawson MRN: [id number] Date of Birth: 12-31-1940 Referring Provider:   [name] Pulmonary Rehab Walk Test from 09/14/2015 in [place]  Referring Provider  Dr. Halford Chessman      Encounter Date: 10/21/2015  Check In:     Session Check In - 10/21/15 1227      Check-In   Location [facility]      Capillary Blood Glucose: No results found for this or any previous visit (from the past 24 hour(s)).      Exercise Prescription Changes - 10/21/15 1200      Response to Exercise   Blood Pressure (Admit) 90/46   Blood Pressure (Exercise) 132/74   Blood Pressure (Exit) 100/60   Heart Rate (Admit) 58 bpm   Heart Rate (Exercise) 74 bpm   Heart Rate (Exit) 58 bpm   Oxygen Saturation (Admit) 97 %   Oxygen Saturation (Exercise) 94 %   Oxygen Saturation (Exit) 94 %   Rating of Perceived Exertion (Exercise) 12   Perceived Dyspnea (Exercise) 3   Duration Progress to 45 minutes of aerobic exercise without signs/symptoms of physical distress   Intensity THRR unchanged     Progression   Progression Continue to progress workloads to maintain intensity without signs/symptoms of physical distress.     Resistance Training   Training Prescription Yes   Weight blue bands   Reps 10-12  10 min     Interval Training   Interval Training No     Oxygen   Oxygen Continuous   Liters 2     NuStep   Level 4   Minutes 17   METs 2     Track   Laps 12  RD consult for 5 min   Minutes 17     Goals Met:  Exercise tolerated well No report of cardiac concerns or symptoms Strength training completed today  Goals Unmet:  Not Applicable  Comments: Service time is from 10:30am to 12:20pm    Dr. Rush Farmer is Medical Director for Pulmonary Rehab at [facility].

## 2015-10-22 ENCOUNTER — Ambulatory Visit (INDEPENDENT_AMBULATORY_CARE_PROVIDER_SITE_OTHER): Payer: Medicare Other | Admitting: Pulmonary Disease

## 2015-10-22 ENCOUNTER — Encounter: Payer: Self-pay | Admitting: Pulmonary Disease

## 2015-10-22 ENCOUNTER — Telehealth: Payer: Self-pay

## 2015-10-22 VITALS — BP 102/52 | HR 49 | Ht 66.0 in | Wt 279.6 lb

## 2015-10-22 DIAGNOSIS — G4733 Obstructive sleep apnea (adult) (pediatric): Secondary | ICD-10-CM | POA: Diagnosis not present

## 2015-10-22 DIAGNOSIS — J9611 Chronic respiratory failure with hypoxia: Secondary | ICD-10-CM | POA: Diagnosis not present

## 2015-10-22 DIAGNOSIS — R0600 Dyspnea, unspecified: Secondary | ICD-10-CM

## 2015-10-22 DIAGNOSIS — Z6841 Body Mass Index (BMI) 40.0 and over, adult: Secondary | ICD-10-CM

## 2015-10-22 DIAGNOSIS — J4530 Mild persistent asthma, uncomplicated: Secondary | ICD-10-CM | POA: Diagnosis not present

## 2015-10-22 DIAGNOSIS — R0609 Other forms of dyspnea: Secondary | ICD-10-CM

## 2015-10-22 DIAGNOSIS — I2510 Atherosclerotic heart disease of native coronary artery without angina pectoris: Secondary | ICD-10-CM

## 2015-10-22 MED ORDER — LOSARTAN POTASSIUM 25 MG PO TABS: 25.0000 mg | ORAL_TABLET | Freq: Every day | ORAL | 3 refills | Status: DC

## 2015-10-22 NOTE — Telephone Encounter (Signed)
Received a call from patient.He stated he has been going to respiratory rehab and B/P has been low 88/50,90/58,100/60,105/60 pulse 50 to 60.Stated he was advised to call Dr.Jordan and see if medications need to be decreased.Spoke to [place] he advised to decrease losartan to 25 mg daily.Advised to call back if B/P continues to be low or if B/P becomes too high.

## 2015-10-22 NOTE — Progress Notes (Signed)
Current Outpatient Prescriptions on File Prior to Visit  Medication Sig  . acetaminophen (TYLENOL) 500 MG tablet Take 500 mg by mouth as needed.  Marland Kitchen albuterol (PROAIR HFA) 108 (90 Base) MCG/ACT inhaler 2 puffs every 4 hours as needed only  if your can't catch your breath (Patient taking differently: 2 puffs every 4 hours as needed for wheezing and shortness of breath)  . atenolol (TENORMIN) 25 MG tablet Take 25 mg by mouth every morning.   Marland Kitchen atorvastatin (LIPITOR) 20 MG tablet Take 1 tablet (20 mg total) by mouth daily. (Patient taking differently: Take 10 mg by mouth daily. )  . budesonide-formoterol (SYMBICORT) 160-4.5 MCG/ACT inhaler Inhale 2 puffs into the lungs 2 (two) times daily.  . celecoxib (CELEBREX) 200 MG capsule Take 200 mg by mouth 2 (two) times daily.  Marland Kitchen dextromethorphan-guaiFENesin (MUCINEX DM) 30-600 MG 12hr tablet Take by mouth as needed.  . fluticasone (FLONASE) 50 MCG/ACT nasal spray Place into both nostrils daily as needed (nasal congestion).  . fluticasone (FLOVENT HFA) 110 MCG/ACT inhaler Inhale into the lungs.  . furosemide (LASIX) 80 MG tablet Take 1 tablet (80 mg total) by mouth daily. Take 80 mg in the morning and 40 mg in the afternoon.  Marland Kitchen KLOR-CON M20 20 MEQ tablet TAKE 2 TABLETS (40 MEQ TOTAL) BY MOUTH 2 (TWO) TIMES DAILY.  Marland Kitchen losartan (COZAAR) 100 MG tablet Take 50 mg by mouth at bedtime.   . metFORMIN (GLUCOPHAGE) 500 MG tablet Take 500 mg by mouth daily with breakfast.   . montelukast (SINGULAIR) 10 MG tablet Take 10 mg by mouth at bedtime.  . nitroGLYCERIN (NITROSTAT) 0.4 MG SL tablet Place 1 tablet (0.4 mg total) under the tongue every 5 (five) minutes as needed for chest pain.  Marland Kitchen omeprazole (PRILOSEC) 20 MG capsule Take 20 mg by mouth daily before breakfast.  . OXYGEN Inhale into the lungs. Use CPAP at bedtime  . rivaroxaban (XARELTO) 20 MG TABS tablet Take 20 mg by mouth.  . sildenafil (VIAGRA) 100 MG tablet Take 100 mg by mouth.  . spironolactone (ALDACTONE)  25 MG tablet Take 25 mg by mouth at bedtime.   . tamsulosin (FLOMAX) 0.4 MG CAPS Take 0.4 mg by mouth daily.  Marland Kitchen tiZANidine (ZANAFLEX) 4 MG tablet Take 4 mg by mouth 3 (three) times daily as needed.  . topiramate (TOPAMAX) 25 MG capsule Take 2 tablets by mouth twice a day  . traMADol (ULTRAM) 50 MG tablet Take 50 mg by mouth.   No current facility-administered medications on file prior to visit.      Chief Complaint  Patient presents with  . Follow-up    Wears nightly. Denies problems with mask/pressure. DME: [facility] ; Still going to [facility] rehab. Semes to be benfiting from rehab. Discuss O2.      Pulmonary tests PFT 12/20/10 >> FEV1 2.13(79%), FEV1% 71, TLC 5.80(100%), DLCO 57%, no BD CT chest 09/14/11 >> Stable dilatation of ascending thoracic aorta 4.7 cm, PA 4.1 cm Spirometry 4/181/7 >> FEV1 1.68 (55%), FEV1% 69 CT angio chest 10/01/14 >> aortic dilation FeNO 08/13/15 >> 13 Room air SpO2 with exertion 08/13/15 >> 87% - improved with 2 liters oxygen  Sleep tests PSG 03/30/97 >> AHI 21 ONO with BPAP and room air 12/12/10 >> Test time 6 hrs 56 min. Basal SpO2 93.1%, low SpO2 86%. Spent 2.8 min with SpO2 < 88%. PSG 10/21/13 >> AHI 40.1, SaO2 80%, PLMI 75.6, 7 beat VT CPAP 12/28/13 >> CPAP 10 cm H2O >> AHI  1.5, +R  CPAP 09/21/15 to 10/20/15 >> used on 30 of 30 nights with average 7 hrs 49 min.  Average AHI 2.3 with CPAP 10 cm H2O ONO with CPAP 08/19/15 >> test time 8 hrs 40 min.  Basal SpO2 92.3%, low SpO2 86%.  Spent 1.3 min with SpO2 < 88%.  Cardiac tests Echo 04/27/14 >> EF 55 to 123456, grade 1 diastolic CHF, dilated aortic root  Past medical hx HTN, diastolic dysfx, CAD s/p stent, PAF, HLD, TAA, PE in 2008, HA, GERD, IBS, Obesity s/p gastric bypass in 2009, benign positional vertigo, Prostate cancer  Past surgical hx, Allergies, Family hx, Social hx all reviewed.  Vital Signs BP (!) 102/52 (BP Location: Left Arm, Cuff Size: Normal)   Pulse (!) 49   Ht 5\' 6"  (1.676 m)   Wt 279 lb 9.6  oz (126.8 kg)   SpO2 96%   BMI 45.13 kg/m   History of Present Illness Carlos Dawson is a 75 y.o. male former smoker with OSA, and asthma.  He has been doing better.  Going to pulmonary rehab.  Only issue is when he walks on treadmill his thighs burn, he feels more short of breath, and tight in his chest >> no wheeze, and told his oxygen level okay.  He does not get these symptoms when he does other exercises.  He is using CPAP at night w/o difficulty.  He uses oxygen with exertion and this helps.  He got his flu shot earlier this month.  He was told on two occasions that his blood pressure was in the 80's at rehab >> he was told to drink fluid, and then blood pressure increased.  Physical Exam  General - No distress ENT - No sinus tenderness, no oral exudate, no LAN Cardiac - s1s2 regular, no murmur Chest - No wheeze/rales/dullness Back - No focal tenderness Abd - Soft, non-tender Ext - No edema, bandage on Lt arm Neuro - Normal strength Skin - No rashes Psych - normal mood, and behavior  Assessment/Plan  Obstructive sleep apnea. - He is compliant with therapy and reports benefit from CPAP - continue CPAP 10 cm H2O  Mild, persistent asthma. - continue symbicort, singulair, and prn albuterol  Dyspnea on exertion. - likely related to exertional hypoxia, and deconditioning - advised him to contact PCP and cardiology about chest and leg discomfort while walking on treadmill, and to discuss about intermittent hypotension  Chronic hypoxic respiratory failure. - will have him use 2 liters oxygen during the day with exertion   Patient Instructions  Will have stationary oxygen concentrator removed from your home, but continue with portable oxygen concentrator  Follow up in 5 months    Chesley Mires, MD [place] [facility]/Sleep Pager:  [phone] 10/22/2015, 3:00 PM

## 2015-10-22 NOTE — Patient Instructions (Signed)
Will have stationary oxygen concentrator removed from your home, but continue with portable oxygen concentrator  Follow up in 5 months

## 2015-10-26 ENCOUNTER — Encounter (HOSPITAL_COMMUNITY)
Admission: RE | Admit: 2015-10-26 | Discharge: 2015-10-26 | Disposition: A | Discharge status: Home / Self Care | Payer: Medicare Other | Source: Ambulatory Visit | Attending: Pulmonary Disease | Admitting: Pulmonary Disease

## 2015-10-26 VITALS — Wt 277.8 lb

## 2015-10-26 DIAGNOSIS — G4733 Obstructive sleep apnea (adult) (pediatric): Secondary | ICD-10-CM | POA: Diagnosis not present

## 2015-10-26 DIAGNOSIS — R5381 Other malaise: Secondary | ICD-10-CM | POA: Diagnosis not present

## 2015-10-26 DIAGNOSIS — Z6841 Body Mass Index (BMI) 40.0 and over, adult: Secondary | ICD-10-CM | POA: Diagnosis not present

## 2015-10-26 DIAGNOSIS — J4530 Mild persistent asthma, uncomplicated: Secondary | ICD-10-CM | POA: Diagnosis not present

## 2015-10-26 DIAGNOSIS — R0609 Other forms of dyspnea: Secondary | ICD-10-CM | POA: Insufficient documentation

## 2015-10-26 DIAGNOSIS — J9611 Chronic respiratory failure with hypoxia: Secondary | ICD-10-CM | POA: Diagnosis not present

## 2015-10-26 NOTE — Progress Notes (Signed)
Daily Session Note  Patient Details  Name: RAYANSH HERBST MRN: [id number] Date of Birth: 1940-10-22 Referring Provider:   [name] Pulmonary Rehab Walk Test from 09/14/2015 in [place]  Referring Provider  Dr. Halford Chessman      Encounter Date: 10/26/2015  Check In:     Session Check In - 10/26/15 1032      Check-In   Location [facility]   Staff Present Su Hilt, MS, ACSM RCEP, Exercise Physiologist;Joan Leonia Reeves, RN, BSN;Sherise Geerdes, RN;Portia Rollene Rotunda, RN, BSN   Supervising physician immediately available to respond to emergencies [facility] immediately available   Physician(s) Dr. Maylene Roes   Medication changes reported     No   Fall or balance concerns reported    No   Warm-up and Cool-down Performed as group-led instruction   Resistance Training Performed Yes   VAD Patient? No     Pain Assessment   Currently in Pain? No/denies   Multiple Pain Sites No      Capillary Blood Glucose: No results found for this or any previous visit (from the past 24 hour(s)).     POCT Glucose - 10/26/15 1222      POCT Blood Glucose   Pre-Exercise 249 mg/dL   Post-Exercise 230 mg/dL         Exercise Prescription Changes - 10/26/15 1200      Exercise Review   Progression Yes     Response to Exercise   Blood Pressure (Admit) 110/56   Blood Pressure (Exercise) 104/60   Blood Pressure (Exit) 90/60  recheck BP after water 106/64   Heart Rate (Admit) 54 bpm   Heart Rate (Exercise) 84 bpm   Heart Rate (Exit) 60 bpm   Oxygen Saturation (Admit) 97 %   Oxygen Saturation (Exercise) 91 %   Oxygen Saturation (Exit) 94 %   Rating of Perceived Exertion (Exercise) 13   Perceived Dyspnea (Exercise) 2   Duration Progress to 45 minutes of aerobic exercise without signs/symptoms of physical distress   Intensity THRR unchanged     Progression   Progression Continue to progress workloads to maintain intensity without signs/symptoms  of physical distress.     Resistance Training   Training Prescription Yes   Weight blue bands   Reps 10-12  10 minutes of strength training     Interval Training   Interval Training No     Oxygen   Oxygen Continuous   Liters 2     NuStep   Level 4   Minutes 17   METs 2.2     Arm Ergometer   Level 5   Minutes 17     Track   Laps 11   Minutes 17     Goals Met:  Exercise tolerated well No report of cardiac concerns or symptoms Strength training completed today  Goals Unmet:  Not Applicable  Comments: Service time is from 1030 to 1210     Dr. Rush Farmer is Medical Director for Pulmonary Rehab at [facility].

## 2015-10-28 ENCOUNTER — Encounter (HOSPITAL_COMMUNITY)
Admission: RE | Admit: 2015-10-28 | Discharge: 2015-10-28 | Disposition: A | Discharge status: Home / Self Care | Payer: Medicare Other | Source: Ambulatory Visit | Attending: Pulmonary Disease | Admitting: Pulmonary Disease

## 2015-10-28 VITALS — Wt 279.8 lb

## 2015-10-28 DIAGNOSIS — Z6841 Body Mass Index (BMI) 40.0 and over, adult: Secondary | ICD-10-CM | POA: Diagnosis not present

## 2015-10-28 DIAGNOSIS — J9611 Chronic respiratory failure with hypoxia: Secondary | ICD-10-CM

## 2015-10-28 DIAGNOSIS — G4733 Obstructive sleep apnea (adult) (pediatric): Secondary | ICD-10-CM | POA: Diagnosis not present

## 2015-10-28 DIAGNOSIS — K521 Toxic gastroenteritis and colitis: Secondary | ICD-10-CM | POA: Diagnosis not present

## 2015-10-28 DIAGNOSIS — Z7189 Other specified counseling: Secondary | ICD-10-CM | POA: Diagnosis not present

## 2015-10-28 DIAGNOSIS — R0609 Other forms of dyspnea: Secondary | ICD-10-CM | POA: Diagnosis not present

## 2015-10-28 DIAGNOSIS — R5381 Other malaise: Secondary | ICD-10-CM | POA: Diagnosis not present

## 2015-10-28 DIAGNOSIS — J4530 Mild persistent asthma, uncomplicated: Secondary | ICD-10-CM | POA: Diagnosis not present

## 2015-10-28 NOTE — Progress Notes (Signed)
Daily Session Note  Patient Details  Name: Carlos Dawson MRN: [id number] Date of Birth: 05/15/1940 Referring Provider:   [name] Pulmonary Rehab Walk Test from 09/14/2015 in [place]  Referring Provider  Dr. Halford Chessman      Encounter Date: 10/28/2015  Check In:     Session Check In - 10/28/15 1021      Check-In   Location [facility]   Staff Present Su Hilt, MS, ACSM RCEP, Exercise Physiologist;Tomara Youngberg Leonia Reeves, RN, BSN;Lisa Hughes, RN;Portia Rollene Rotunda, RN, BSN   Supervising physician immediately available to respond to emergencies [facility] immediately available   Physician(s) Dr. [place] Pink   Medication changes reported     No   Fall or balance concerns reported    No   Warm-up and Cool-down Performed as group-led instruction   Resistance Training Performed Yes   VAD Patient? No     Pain Assessment   Currently in Pain? No/denies   Multiple Pain Sites No      Capillary Blood Glucose: No results found for this or any previous visit (from the past 24 hour(s)).     POCT Glucose - 10/28/15 1234      POCT Blood Glucose   Pre-Exercise 244 mg/dL   Post-Exercise 136 mg/dL         Exercise Prescription Changes - 10/28/15 1200      Response to Exercise   Blood Pressure (Admit) 100/56   Blood Pressure (Exercise) 142/70   Blood Pressure (Exit) 92/80   Heart Rate (Admit) 55 bpm   Heart Rate (Exercise) 88 bpm   Heart Rate (Exit) 62 bpm   Oxygen Saturation (Admit) 98 %   Oxygen Saturation (Exercise) 90 %   Oxygen Saturation (Exit) 94 %   Rating of Perceived Exertion (Exercise) 12   Perceived Dyspnea (Exercise) 1   Duration Progress to 45 minutes of aerobic exercise without signs/symptoms of physical distress   Intensity THRR unchanged     Progression   Progression Continue to progress workloads to maintain intensity without signs/symptoms of physical distress.     Resistance Training   Training  Prescription Yes   Weight blue bands   Reps 10-12  10 minutes of strength training     Interval Training   Interval Training No     Oxygen   Oxygen Continuous   Liters 2     Arm Ergometer   Level 5   Minutes 17     Track   Laps 14   Minutes 17     Goals Met:  Exercise tolerated well Strength training completed today  Goals Unmet:  Not Applicable  Comments: Service time is from 1030 to 1220    Dr. Rush Farmer is Medical Director for Pulmonary Rehab at [facility].

## 2015-11-01 DIAGNOSIS — C61 Malignant neoplasm of prostate: Secondary | ICD-10-CM | POA: Diagnosis not present

## 2015-11-02 ENCOUNTER — Encounter (HOSPITAL_COMMUNITY)
Admission: RE | Admit: 2015-11-02 | Discharge: 2015-11-02 | Disposition: A | Discharge status: Home / Self Care | Payer: Medicare Other | Source: Ambulatory Visit | Attending: Pulmonary Disease | Admitting: Pulmonary Disease

## 2015-11-02 DIAGNOSIS — J9611 Chronic respiratory failure with hypoxia: Secondary | ICD-10-CM

## 2015-11-02 NOTE — Progress Notes (Signed)
Pulmonary Individual Treatment Plan  Patient Details  Name: Carlos Dawson MRN: [id number] Date of Birth: Sep 17, 1940 Referring Provider:   [name] Pulmonary Rehab Walk Test from 09/14/2015 in [place]  Referring Provider  Dr. Halford Chessman      Initial Encounter Date:  Flowsheet Row Pulmonary Rehab Walk Test from 09/14/2015 in [place]  Date  09/14/15  Referring Provider  Dr. Halford Chessman      Visit Diagnosis: Chronic respiratory failure with hypoxia ([place])  Patient's Home Medications on Admission:   Current Outpatient Prescriptions:  .  acetaminophen (TYLENOL) 500 MG tablet, Take 500 mg by mouth as needed., Disp: , Rfl:  .  albuterol (PROAIR HFA) 108 (90 Base) MCG/ACT inhaler, 2 puffs every 4 hours as needed only  if your can't catch your breath (Patient taking differently: 2 puffs every 4 hours as needed for wheezing and shortness of breath), Disp: 1 Inhaler, Rfl: 11 .  atenolol (TENORMIN) 25 MG tablet, Take 25 mg by mouth every morning. , Disp: , Rfl:  .  atorvastatin (LIPITOR) 20 MG tablet, Take 1 tablet (20 mg total) by mouth daily. (Patient taking differently: Take 10 mg by mouth daily. ), Disp: 90 tablet, Rfl: 3 .  budesonide-formoterol (SYMBICORT) 160-4.5 MCG/ACT inhaler, Inhale 2 puffs into the lungs 2 (two) times daily., Disp: 1 Inhaler, Rfl: 6 .  celecoxib (CELEBREX) 200 MG capsule, Take 200 mg by mouth 2 (two) times daily., Disp: , Rfl:  .  dextromethorphan-guaiFENesin (MUCINEX DM) 30-600 MG 12hr tablet, Take by mouth as needed., Disp: , Rfl:  .  fluticasone (FLONASE) 50 MCG/ACT nasal spray, Place into both nostrils daily as needed (nasal congestion)., Disp: , Rfl:  .  fluticasone (FLOVENT HFA) 110 MCG/ACT inhaler, Inhale into the lungs., Disp: , Rfl:  .  furosemide (LASIX) 80 MG tablet, Take 1 tablet (80 mg total) by mouth daily. Take 80 mg in the morning and 40 mg in the afternoon., Disp: , Rfl:  .  KLOR-CON M20 20  MEQ tablet, TAKE 2 TABLETS (40 MEQ TOTAL) BY MOUTH 2 (TWO) TIMES DAILY., Disp: 360 tablet, Rfl: 2 .  losartan (COZAAR) 25 MG tablet, Take 1 tablet (25 mg total) by mouth daily., Disp: 90 tablet, Rfl: 3 .  metFORMIN (GLUCOPHAGE) 500 MG tablet, Take 500 mg by mouth daily with breakfast. , Disp: , Rfl:  .  montelukast (SINGULAIR) 10 MG tablet, Take 10 mg by mouth at bedtime., Disp: , Rfl:  .  nitroGLYCERIN (NITROSTAT) 0.4 MG SL tablet, Place 1 tablet (0.4 mg total) under the tongue every 5 (five) minutes as needed for chest pain., Disp: 25 tablet, Rfl: 6 .  omeprazole (PRILOSEC) 20 MG capsule, Take 20 mg by mouth daily before breakfast., Disp: , Rfl:  .  OXYGEN, Inhale into the lungs. Use CPAP at bedtime, Disp: , Rfl:  .  rivaroxaban (XARELTO) 20 MG TABS tablet, Take 20 mg by mouth., Disp: , Rfl:  .  sildenafil (VIAGRA) 100 MG tablet, Take 100 mg by mouth., Disp: , Rfl:  .  spironolactone (ALDACTONE) 25 MG tablet, Take 25 mg by mouth at bedtime. , Disp: , Rfl:  .  tamsulosin (FLOMAX) 0.4 MG CAPS, Take 0.4 mg by mouth daily., Disp: , Rfl:  .  tiZANidine (ZANAFLEX) 4 MG tablet, Take 4 mg by mouth 3 (three) times daily as needed., Disp: , Rfl: 2 .  topiramate (TOPAMAX) 25 MG capsule, Take 2 tablets by mouth twice a day,  Disp: , Rfl:  .  traMADol (ULTRAM) 50 MG tablet, Take 50 mg by mouth., Disp: , Rfl:   Past Medical History: Past Medical History:  Diagnosis Date  . Anticoagulant long-term use   . Aortic root enlargement ([place])   . Ascending aortic aneurysm [facility])    recent scan in October 2012 showing no change; followed by Dr. Servando Snare  . ASCVD (arteriosclerotic cardiovascular disease)    Prior BMS to the 2nd OM in September 2012; with repeat cath in October showing patency  . CAD (coronary artery disease)    a. s/p BMS to 2nd OM in Sept 2012; b. LexiScan Myoview (12/2012):  Inf infarct; bowel and motion artifact make study difficult to interpret; no ischemia; not gated; Low Risk  . CHF  (congestive heart failure) ([place])    no recent issues 10/13/14  . Colonic polyp   . Contact lens/glasses fitting   . Diabetes mellitus without complication (HCC)    metphormin, average 154  . Diastolic dysfunction   . Generalized headaches    neck stenosis  . GERD (gastroesophageal reflux disease)   . Hearing loss   . Hemorrhoids   . Hypertension   . IBS (irritable bowel syndrome)   . LVH (left ventricular hypertrophy)   . Mild intermittent asthma   . OA (osteoarthritis)   . Obesities, morbid ([place])   . OSA (obstructive sleep apnea)    PSG 03/30/97 AHI 21, BPAP 13/9  . PAF (paroxysmal atrial fibrillation) ([place])    treated with Coumadin  . Prostate CA [facility])    Oncologist  DR. Daralene Mil[facility] 09/24/14, undetermined tx   . Pulmonary embolism ([place])    2008  . Sleep apnea   . SOB (shortness of breath)    on excertion  . Thoracic aortic aneurysm (HCC)    Aortic Size Index=     5.0    /Body surface area is 2.43 meters squared. = 2.05  < 2.75 cm/m2      4% risk per year 2.75 to 4.25          8% risk per year > 4.25 cm/m2    20% risk per year   Stable aneurysmal dilation of the ascending aorta with maximum AP diameter of 4.8 cm. Stable area of narrowing of the proximal most portion of the descending aorta measuring 2 cm., previously identified as an area of coarctation. No evidence of aortic dissection.  Coronary artery disease.  Normal appearance of the lungs.   Electronically Signed   By: Fidela Salisbury M.D.   On: 10/01/2014 08:50      Tobacco Use: History  Smoking Status  . Former Smoker  . Packs/day: 1.50  . Years: 30.00  . Quit date: 01/24/1992  Smokeless Tobacco  . Never Used    Comment: Stopped in 1983    Labs: Recent Review Flowsheet Data    Labs for ITP Cardiac and Pulmonary Rehab Latest Ref Rng & Units 02/26/2010 11/20/2011 01/07/2013 05/07/2014 05/07/2014   Cholestrol 0 - 200 mg/dL - 124 118 - -   LDLCALC 0 - 99 mg/dL - 62 51 - -   HDL >39 mg/dL - 41.10 40 - -    Trlycerides <150 mg/dL - 104.0 133 - -   PHART 7.350 - 7.450 - - - - 7.336(L)   PCO2ART 35.0 - 45.0 mmHg - - - - 42.3   HCO3 20.0 - 24.0 mEq/L - - - 23.0 22.6   TCO2 0 - 100 mmol/L 25 - -  24 24   ACIDBASEDEF 0.0 - 2.0 mmol/L - - - 3.0(H) 3.0(H)   O2SAT % - - - 61.0 91.0      Capillary Blood Glucose: Lab Results  Component Value Date   GLUCAP 116 (H) 12/29/2014   GLUCAP 113 (H) 05/07/2014   GLUCAP 135 (H) 05/07/2014   GLUCAP 95 01/28/2013   GLUCAP 97 06/01/2012       POCT Glucose    Row Name 09/21/15 1212 09/23/15 1211 09/28/15 1227 09/30/15 1227 10/07/15 1210     POCT Blood Glucose   Pre-Exercise 176 mg/dL 218 mg/dL 172 mg/dL 212 mg/dL 231 mg/dL   Post-Exercise 137 mg/dL 159 mg/dL 144 mg/dL 148 mg/dL 208 mg/dL   Row Name 10/19/15 1226 10/26/15 1222 10/28/15 1234         POCT Blood Glucose   Pre-Exercise 159 mg/dL 249 mg/dL 244 mg/dL     Post-Exercise 158 mg/dL 230 mg/dL 136 mg/dL        ADL UCSD:     Pulmonary Assessment Scores    Row Name 09/15/15 1600         ADL UCSD   ADL Phase Entry     SOB Score total 41        Pulmonary Function Assessment:     Pulmonary Function Assessment - 09/13/15 1106      Breath   Bilateral Breath Sounds Clear      Exercise Target Goals:    Exercise Program Goal: Individual exercise prescription set with THRR, safety & activity barriers. Participant demonstrates ability to understand and report RPE using BORG scale, to self-measure pulse accurately, and to acknowledge the importance of the exercise prescription.  Exercise Prescription Goal: Starting with aerobic activity 30 plus minutes a day, 3 days per week for initial exercise prescription. Provide home exercise prescription and guidelines that participant acknowledges understanding prior to discharge.  Activity Barriers & Risk Stratification:     Activity Barriers & Cardiac Risk Stratification - 09/13/15 1104      Activity Barriers & Cardiac Risk  Stratification   Activity Barriers Deconditioning;Shortness of Breath;Back Problems;Neck/Spine Problems      6 Minute Walk:     6 Minute Walk    Row Name 09/14/15 1626         6 Minute Walk   Phase Initial     Distance 1210 feet     Walk Time 6 minutes     # of Rest Breaks 0     MPH 2.29     METS 2.68     RPE 13     Perceived Dyspnea  2     Symptoms Yes (comment)     Comments leg fatigue     Resting HR 75 bpm     Resting BP 120/60     Max Ex. HR 104 bpm     Max Ex. BP 148/62     2 Minute Post BP 126/60       Interval HR   Baseline HR 75     1 Minute HR 85     2 Minute HR 92     3 Minute HR 93     4 Minute HR 97     5 Minute HR 104     6 Minute HR 94     2 Minute Post HR 85     Interval Heart Rate? Yes       Interval Oxygen   Interval Oxygen? Yes     Baseline  Oxygen Saturation % 96 %     Baseline Liters of Oxygen 2 L     1 Minute Oxygen Saturation % 95 %     1 Minute Liters of Oxygen 2 L     2 Minute Oxygen Saturation % 93 %     2 Minute Liters of Oxygen 2 L     3 Minute Oxygen Saturation % 91 %     3 Minute Liters of Oxygen 2 L     4 Minute Oxygen Saturation % 91 %     4 Minute Liters of Oxygen 2 L     5 Minute Oxygen Saturation % 90 %     5 Minute Liters of Oxygen 2 L     6 Minute Oxygen Saturation % 90 %     6 Minute Liters of Oxygen 2 L     2 Minute Post Oxygen Saturation % 93 %     2 Minute Post Liters of Oxygen 2 L        Initial Exercise Prescription:     Initial Exercise Prescription - 09/14/15 1600      Date of Initial Exercise RX and Referring Provider   Date 09/14/15   Referring Provider Dr. Halford Chessman     Oxygen   Oxygen Continuous   Liters 2     NuStep   Level 1   Minutes 17   METs 1.5     Arm Ergometer   Level 1   Minutes 17     Track   Laps 5   Minutes 17     Prescription Details   Frequency (times per week) 2   Duration Progress to 45 minutes of aerobic exercise without signs/symptoms of physical distress      Intensity   THRR 40-80% of Max Heartrate 59-118   Ratings of Perceived Exertion 11-13   Perceived Dyspnea 0-4     Progression   Progression Continue progressive overload as per policy without signs/symptoms or physical distress.     Resistance Training   Training Prescription Yes   Weight blue bands   Reps 10-12      Perform Capillary Blood Glucose checks as needed.  Exercise Prescription Changes:     Exercise Prescription Changes    Row Name 09/21/15 1200 09/23/15 1200 09/28/15 1200 09/30/15 1224 10/05/15 1200     Exercise Review   Progression  - Yes  -  -  -     Response to Exercise   Blood Pressure (Admit) 102/66 110/52 104/56 100/60 110/60   Blood Pressure (Exercise) 132/100 112/54 120/62 110/60  -   Blood Pressure (Exit) 110/60 104/50 100/58 98/50 98/64   Heart Rate (Admit) 62 bpm 59 bpm 58 bpm 60 bpm 68 bpm   Heart Rate (Exercise) 89 bpm 79 bpm 88 bpm 78 bpm  -   Heart Rate (Exit) 70 bpm 65 bpm 66 bpm 62 bpm 59 bpm   Oxygen Saturation (Admit) 93 % 97 % 95 % 96 % 97 %   Oxygen Saturation (Exercise) 88 % 93 % 91 % 91 %  -   Oxygen Saturation (Exit) 95 % 93 % 92 % 95 % 95 %   Rating of Perceived Exertion (Exercise) 12  -  - 13 13   Perceived Dyspnea (Exercise) 1  -  - 1 3   Duration Progress to 45 minutes of aerobic exercise without signs/symptoms of physical distress Progress to 45 minutes of aerobic exercise without signs/symptoms of physical  distress Progress to 45 minutes of aerobic exercise without signs/symptoms of physical distress Progress to 45 minutes of aerobic exercise without signs/symptoms of physical distress  -   Intensity Other (comment)  HRR 40-80% THRR unchanged THRR unchanged THRR unchanged  -     Progression   Progression Continue to progress workloads to maintain intensity without signs/symptoms of physical distress. Continue to progress workloads to maintain intensity without signs/symptoms of physical distress. Continue to progress workloads to  maintain intensity without signs/symptoms of physical distress. Continue to progress workloads to maintain intensity without signs/symptoms of physical distress.  -     Resistance Training   Training Prescription Yes Yes Yes Yes  -   Weight blue bands blue bands blue bands blue bands  -   Reps 10-12 10-12  10 minutes of strength training 10-12  10 minutes of strength training 10-12  10 minutes of strength training  -     Interval Training   Interval Training  -  - No No  -     Oxygen   Oxygen _0    Liters _1 NuStep   Level 2  - 2 3  -   Minutes 17  - 17 17  -   METs 1.7  - 1.8 2  -     Arm Ergometer   Level _2 -  -   Minutes _3 -  -     Track   Laps _4 Minutes _5 Row Name 10/07/15 1200 10/19/15 1200 10/21/15 1200 10/26/15 1200 10/28/15 1200     Exercise Review   Progression Yes Yes  - Yes  -     Response to Exercise   Blood Pressure (Admit) 94/50 88/50 90/46 110/56 100/56   Blood Pressure (Exercise) 110/64 98/68 132/74 104/60 142/70   Blood Pressure (Exit) 98/64 100/58 100/60 90/60  recheck BP after water 106/64 92/80   Heart Rate (Admit) 57 bpm 61 bpm 58 bpm 54 bpm 55 bpm   Heart Rate (Exercise) 64 bpm 70 bpm 74 bpm 84 bpm 88 bpm   Heart Rate (Exit) 61 bpm 57 bpm 58 bpm 60 bpm 62 bpm   Oxygen Saturation (Admit) 96 % 96 % 97 % 97 % 98 %   Oxygen Saturation (Exercise) 89 % 96 % 94 % 91 % 90 %   Oxygen Saturation (Exit) 93 % 57 % 94 % 94 % 94 %   Rating of Perceived Exertion (Exercise) _6 Perceived Dyspnea (Exercise) _7 Duration Progress to 45 minutes of aerobic exercise without signs/symptoms of physical distress Progress to 45 minutes of aerobic exercise without signs/symptoms of physical distress Progress to 45 minutes of aerobic exercise without signs/symptoms of physical distress Progress to 45 minutes of aerobic exercise without signs/symptoms  of physical distress Progress to 45 minutes of aerobic exercise without signs/symptoms of physical distress   Intensity _8      Progression   Progression Continue progressive overload as per policy without signs/symptoms or physical distress. Continue to progress workloads to maintain intensity without signs/symptoms of physical distress. Continue to progress workloads to maintain intensity without signs/symptoms of physical distress. Continue to progress workloads to maintain intensity without signs/symptoms of physical distress. Continue to progress workloads  to maintain intensity without signs/symptoms of physical distress.     Resistance Training   Training Prescription _0    Weight _1    Reps 10-12 10-12  10 min 10-12  10 min 10-12  10 minutes of strength training 10-12  10 minutes of strength training     Interval Training   Interval Training _2      Oxygen   Oxygen _3    Liters _4 NuStep   Level  - _5 -   Minutes  - _6 -   METs  - 1.9 2 2.2  -     Arm Ergometer   Level 3 4  - 5 5   Minutes 17 17  - 17 17     Track   Laps 10 7  RD consult for 5 min 12  RD consult for 5 min 11 14   Minutes _7 Home Exercise Plan   Plans to continue exercise at  - Home  -  -  -   Frequency  - Add 3 additional days to program exercise sessions.  -  -  -      Exercise Comments:     Exercise Comments    Row Name 10/04/15 1137 10/19/15 1707 11/01/15 0940       Exercise Comments Patient has completed 4 sessons. Two workload increases. Will cont. to monitor.  home exercise completed After I completed home exercise with patient and discussed goals, I can tell a difference in how hard the patient works each time he comes to class. He has really stepped up  his intensity. Will cont. to monitor.         Discharge Exercise Prescription (Final Exercise Prescription Changes):     Exercise Prescription Changes - 10/28/15 1200      Response to Exercise   Blood Pressure (Admit) 100/56   Blood Pressure (Exercise) 142/70   Blood Pressure (Exit) 92/80   Heart Rate (Admit) 55 bpm   Heart Rate (Exercise) 88 bpm   Heart Rate (Exit) 62 bpm   Oxygen Saturation (Admit) 98 %   Oxygen Saturation (Exercise) 90 %   Oxygen Saturation (Exit) 94 %   Rating of Perceived Exertion (Exercise) 12   Perceived Dyspnea (Exercise) 1   Duration Progress to 45 minutes of aerobic exercise without signs/symptoms of physical distress   Intensity THRR unchanged     Progression   Progression Continue to progress workloads to maintain intensity without signs/symptoms of physical distress.     Resistance Training   Training Prescription Yes   Weight blue bands   Reps 10-12  10 minutes of strength training     Interval Training   Interval Training No     Oxygen   Oxygen Continuous   Liters 2     Arm Ergometer   Level 5   Minutes 17     Track   Laps 14   Minutes 17       Nutrition:  Target Goals: Understanding of nutrition guidelines, daily intake of sodium <1521m, cholesterol <2049m calories 30% from fat and 7% or less from saturated fats, daily to have 5 or more servings of fruits and vegetables.  Biometrics:     Pre Biometrics - 09/13/15 1112  Pre Biometrics   Grip Strength 40 kg       Nutrition Therapy Plan and Nutrition Goals:     Nutrition Therapy & Goals - 10/07/15 1504      Nutrition Therapy   Diet Carb Modified, Therapeutic Lifestyle Changes     Personal Nutrition Goals   Personal Goal #1 1-2 lb wt loss/week to a wt loss goal of 6-24 lb at graduation     [place], educate and counsel regarding individualized specific dietary modifications aiming towards targeted core components such as  weight, hypertension, lipid management, diabetes, heart failure and other comorbidities.   Expected Outcomes Short Term Goal: Understand basic principles of dietary content, such as calories, fat, sodium, cholesterol and nutrients.;Long Term Goal: Adherence to prescribed nutrition plan.      Nutrition Discharge: Rate Your Plate Scores:     Nutrition Assessments - 10/19/15 1202      Rate Your Plate Scores   Pre Score 46      Psychosocial: Target Goals: Acknowledge presence or absence of depression, maximize coping skills, provide positive support system. Participant is able to verbalize types and ability to use techniques and skills needed for reducing stress and depression.  Initial Review & Psychosocial Screening:     Initial Psych Review & Screening - 09/13/15 [place]? Yes     Barriers   Psychosocial barriers to participate in program There are no identifiable barriers or psychosocial needs.     Screening Interventions   Interventions Encouraged to exercise      Quality of Life Scores:     Quality of Life - 09/15/15 1601      Quality of Life Scores   Health/Function Pre 22 %   Socioeconomic Pre 28.29 %   Psych/Spiritual Pre 27.43 %   Family Pre 24 %   GLOBAL Pre 24.73 %      PHQ-9: Recent Review Flowsheet Data    Depression screen [facility] 2/9 09/13/2015   Decreased Interest 0   Down, Depressed, Hopeless 0   PHQ - 2 Score 0      Psychosocial Evaluation and Intervention:     Psychosocial Evaluation - 09/13/15 1110      Psychosocial Evaluation & Interventions   Interventions Encouraged to exercise with the program and follow exercise prescription      Psychosocial Re-Evaluation:     Psychosocial Re-Evaluation    [place] Name 10/05/15 0832 11/02/15 1008           Psychosocial Re-Evaluation   Interventions Encouraged to attend Pulmonary Rehabilitation for the exercise Encouraged to attend Pulmonary Rehabilitation  for the exercise      Comments there are no psychosocial barriers identified in the last 30 days there are no psychosocial barriers identified in the last 30 days        Education: Education Goals: Education classes will be provided on a weekly basis, covering required topics. Participant will state understanding/return demonstration of topics presented.  Learning Barriers/Preferences:     Learning Barriers/Preferences - 09/13/15 1105      Learning Barriers/Preferences   Learning Barriers None   Learning Preferences Written Material;Individual Instruction;Group Instruction;Computer/Internet      Education Topics: Risk Factor Reduction:  -Group instruction that is supported by a PowerPoint presentation. Instructor discusses the definition of a risk factor, different risk factors for pulmonary disease, and how the heart and lungs work together.     Nutrition for  Pulmonary Patient:  -Group instruction provided by PowerPoint slides, verbal discussion, and written materials to support subject matter. The instructor gives an explanation and review of healthy diet recommendations, which includes a discussion on weight management, recommendations for fruit and vegetable consumption, as well as protein, fluid, caffeine, fiber, sodium, sugar, and alcohol. Tips for eating when patients are short of breath are discussed.   Pursed Lip Breathing:  -Group instruction that is supported by demonstration and informational handouts. Instructor discusses the benefits of pursed lip and diaphragmatic breathing and detailed demonstration on how to preform both.   Flowsheet Row PULMONARY REHAB OTHER RESPIRATORY from 10/28/2015 in [place]  Date  09/30/15  Educator  RT  Instruction Review Code  2- meets goals/outcomes      Oxygen Safety:  -Group instruction provided by PowerPoint, verbal discussion, and written material to support subject matter. There is an overview of  "What is Oxygen" and "Why do we need it".  Instructor also reviews how to create a safe environment for oxygen use, the importance of using oxygen as prescribed, and the risks of noncompliance. There is a brief discussion on traveling with oxygen and resources the patient may utilize.   Oxygen Equipment:  -Group instruction provided by [facility] Staff utilizing handouts, written materials, and equipment demonstrations.   Signs and Symptoms:  -Group instruction provided by written material and verbal discussion to support subject matter. Warning signs and symptoms of infection, stroke, and heart attack are reviewed and when to call the physician/911 reinforced. Tips for preventing the spread of infection discussed. Flowsheet Row PULMONARY REHAB OTHER RESPIRATORY from 10/28/2015 in [place]  Date  10/21/15  Educator  rn  Instruction Review Code  2- meets goals/outcomes      Advanced Directives:  -Group instruction provided by verbal instruction and written material to support subject matter. Instructor reviews Advanced Directive laws and proper instruction for filling out document.   Pulmonary Video:  -Group video education that reviews the importance of medication and oxygen compliance, exercise, good nutrition, pulmonary hygiene, and pursed lip and diaphragmatic breathing for the pulmonary patient. Flowsheet Row PULMONARY REHAB OTHER RESPIRATORY from 10/28/2015 in [place]  Date  09/23/15  Instruction Review Code  2- meets goals/outcomes      Exercise for the Pulmonary Patient:  -Group instruction that is supported by a PowerPoint presentation. Instructor discusses benefits of exercise, core components of exercise, frequency, duration, and intensity of an exercise routine, importance of utilizing pulse oximetry during exercise, safety while exercising, and options of places to exercise outside of rehab.   Flowsheet Row  PULMONARY REHAB OTHER RESPIRATORY from 10/28/2015 in [place]  Date  10/14/15  Educator  ep  Instruction Review Code  2- meets goals/outcomes      Pulmonary Medications:  -Verbally interactive group education provided by instructor with focus on inhaled medications and proper administration.   Anatomy and Physiology of the Respiratory System and Intimacy:  -Group instruction provided by PowerPoint, verbal discussion, and written material to support subject matter. Instructor reviews respiratory cycle and anatomical components of the respiratory system and their functions. Instructor also reviews differences in obstructive and restrictive respiratory diseases with examples of each. Intimacy, Sex, and Sexuality differences are reviewed with a discussion on how relationships can change when diagnosed with pulmonary disease. Common sexual concerns are reviewed.   Knowledge Questionnaire Score:     Knowledge Questionnaire Score -  09/15/15 1600      Knowledge Questionnaire Score   Pre Score 6/13      Core Components/Risk Factors/Patient Goals at Admission:     Personal Goals and Risk Factors at Admission - 09/13/15 1108      Core Components/Risk Factors/Patient Goals on Admission    Weight Management Yes;Obesity   Intervention Weight Management: Develop a combined nutrition and exercise program designed to reach desired caloric intake, while maintaining appropriate intake of nutrient and fiber, sodium and fats, and appropriate energy expenditure required for the weight goal.;Weight Management: Provide education and appropriate resources to help participant work on and attain dietary goals.;Weight Management/Obesity: Establish reasonable short term and long term weight goals.;Obesity: Provide education and appropriate resources to help participant work on and attain dietary goals.   Expected Outcomes Short Term: Continue to assess and modify interventions  until short term weight is achieved;Weight Loss: Understanding of general recommendations for a balanced deficit meal plan, which promotes 1-2 lb weight loss per week and includes a negative energy balance of [phone] kcal/d;Understanding recommendations for meals to include 15-35% energy as protein, 25-35% energy from fat, 35-60% energy from carbohydrates, less than 232m of dietary cholesterol, 20-35 gm of total fiber daily;Understanding of distribution of calorie intake throughout the day with the consumption of 4-5 meals/snacks   Increase Strength and Stamina Yes   Intervention Provide advice, education, support and counseling about physical activity/exercise needs.;Develop an individualized exercise prescription for aerobic and resistive training based on initial evaluation findings, risk stratification, comorbidities and participant's personal goals.   Expected Outcomes Achievement of increased cardiorespiratory fitness and enhanced flexibility, muscular endurance and strength shown through measurements of functional capacity and personal statement of participant.   Improve shortness of breath with ADL's Yes   Intervention Provide education, individualized exercise plan and daily activity instruction to help decrease symptoms of SOB with activities of daily living.   Expected Outcomes Short Term: Achieves a reduction of symptoms when performing activities of daily living.   Develop more efficient breathing techniques such as purse lipped breathing and diaphragmatic breathing; and practicing self-pacing with activity Yes   Intervention Provide education, demonstration and support about specific breathing techniuqes utilized for more efficient breathing. Include techniques such as pursed lipped breathing, diaphragmatic breathing and self-pacing activity.   Expected Outcomes Short Term: Participant will be able to demonstrate and use breathing techniques as needed throughout daily activities.       Core Components/Risk Factors/Patient Goals Review:      Goals and Risk Factor Review    Row Name 10/05/15 0831 11/02/15 1008           Core Components/Risk Factors/Patient Goals Review   Personal Goals Review Weight Management/Obesity;Increase Strength and Stamina;Develop more efficient breathing techniques such as purse lipped breathing and diaphragmatic breathing and practicing self-pacing with activity.;Improve shortness of breath with ADL's Weight Management/Obesity;Increase Strength and Stamina;Develop more efficient breathing techniques such as purse lipped breathing and diaphragmatic breathing and practicing self-pacing with activity.;Improve shortness of breath with ADL's      Review see comments section on ITP see comments section on ITP      Expected Outcomes see admission expected outcomes see admission expected outcomes         Core Components/Risk Factors/Patient Goals at Discharge (Final Review):      Goals and Risk Factor Review - 11/02/15 1008      Core Components/Risk Factors/Patient Goals Review   Personal Goals Review Weight Management/Obesity;Increase Strength and Stamina;Develop more efficient breathing techniques  such as purse lipped breathing and diaphragmatic breathing and practicing self-pacing with activity.;Improve shortness of breath with ADL's   Review see comments section on ITP   Expected Outcomes see admission expected outcomes      ITP Comments:   Comments: ITP REVIEW Pt is making expected progress toward pulmonary rehab goals after completing 11 sessions. Recommend continued exercise, life style modification, education, and utilization of breathing techniques to increase stamina and strength and decrease shortness of breath with exertion.

## 2015-11-04 ENCOUNTER — Encounter (HOSPITAL_COMMUNITY)
Admission: RE | Admit: 2015-11-04 | Discharge: 2015-11-04 | Disposition: A | Discharge status: Home / Self Care | Payer: Medicare Other | Source: Ambulatory Visit | Attending: Pulmonary Disease | Admitting: Pulmonary Disease

## 2015-11-04 ENCOUNTER — Encounter: Payer: Self-pay | Admitting: Cardiothoracic Surgery

## 2015-11-04 ENCOUNTER — Ambulatory Visit (INDEPENDENT_AMBULATORY_CARE_PROVIDER_SITE_OTHER): Payer: Medicare Other | Admitting: Cardiothoracic Surgery

## 2015-11-04 VITALS — BP 101/70 | HR 62 | Resp 20 | Ht 66.0 in | Wt 279.8 lb

## 2015-11-04 DIAGNOSIS — I712 Thoracic aortic aneurysm, without rupture, unspecified: Secondary | ICD-10-CM

## 2015-11-04 DIAGNOSIS — I2510 Atherosclerotic heart disease of native coronary artery without angina pectoris: Secondary | ICD-10-CM

## 2015-11-04 DIAGNOSIS — J9611 Chronic respiratory failure with hypoxia: Secondary | ICD-10-CM

## 2015-11-04 NOTE — Progress Notes (Signed)
Incomplete Session Note  Patient Details  Name: Carlos Dawson MRN: [id number] Date of Birth: 1940/12/09 Referring Provider:   [name] Pulmonary Rehab Walk Test from 09/14/2015 in [place]  Referring Provider  Dr. Bethann Goo did not complete his rehab session. He attended education but left before his exercise sessions began related to an MD appointment.

## 2015-11-04 NOTE — Progress Notes (Signed)
Carlos Dawson       [place] [address]             [phone]                   Carlos Dawson [name] Date of Birth: 20-Jun-1940  Referring: Martinique, [name], MD Primary Care: Henrine Screws, MD  Chief Complaint:    Chief Complaint  Patient presents with  . Thoracic Aortic Aneurysm    1 yr f/u on TAA, CTA chest tone 10/14/15  . Follow-up    History of Present Illness:    Patient returns today for followup CT scan one year after previous scan for evaluation of dilated ascending  Aorta. He  has known CAD with  a stent was placed in his circumflex coronary artery in the past. He had a cardiac catheterization in April 2016  He denies any angina at this time but does have some exertional shortness of breath. He is currently in pulmonary rehabilitation .  patient's been diagnosed with prostate cancer in his currently undergoing treatment at [facility].    Current Activity/ Functional Status: Patient is independent with mobility/ambulation, transfers, ADL's, IADL's.   Past Medical History  Diagnosis Date  . Hypertension   . Obesities, morbid   . Pulmonary embolism     2008  . Anticoagulant long-term use   . CAD (coronary artery disease)     a. s/p BMS to 2nd OM in Sept 2012; b. LexiScan Myoview (12/2012):  Inf infarct; bowel and motion artifact make study difficult to interpret; no ischemia; not gated; Low Risk  . OA (osteoarthritis)   . LVH (left ventricular hypertrophy)   . Colonic polyp   . Mild intermittent asthma   . Hemorrhoids   . Generalized headaches   . SOB (shortness of breath)     using oxygen with exercise  . Diastolic dysfunction   . ASCVD (arteriosclerotic cardiovascular disease)     Prior BMS to the 2nd OM in September 2012; with repeat cath in October showing patency  . GERD (gastroesophageal reflux disease)   . IBS (irritable bowel syndrome)   . Aortic root enlargement   .        Marland Kitchen PAF  (paroxysmal atrial fibrillation)     treated with Coumadin  . Hearing loss   . Contact lens/glasses fitting   . OSA (obstructive sleep apnea)     PSG 03/30/97 AHI 21, BPAP 13/9  . Sleep apnea   . Prostate ca   . Thoracic aortic aneurysm     Aortic Size Index=     5.0    /Body surface area is 2.43 meters squared. = 2.05  < 2.75 cm/m2      4% risk per year 2.75 to 4.25          8% risk per year > 4.25 cm/m2    20% risk per year   Stable aneurysmal dilation of the ascending aorta with maximum AP diameter of 4.8 cm. Stable area of narrowing of the proximal most portion of the descending aorta measuring 2 cm., previously identified as an area of coarctation. No evidence of aortic dissection.  Coronary artery disease.  Normal appearance of the lungs.   Electronically Signed   By: Fidela Salisbury M.D.   On: 10/01/2014 08:50      Past Surgical History:  Procedure Laterality Date  . ACHILLES TENDON REPAIR    .  APPENDECTOMY    . CARDIAC CATHETERIZATION  2006  . CARDIAC CATHETERIZATION  October 2012   Stent patent  . CERVICAL SPINE SURGERY  06/02/2010   lower back and neck  . CHOLECYSTECTOMY    . COLONOSCOPY WITH PROPOFOL N/A 12/29/2014   Procedure: COLONOSCOPY WITH PROPOFOL;  Surgeon: Garlan Fair, MD;  Location: [facility] END[facility]COPY;  Service: Endoscopy;  Laterality: N/A;  . CORONARY STENT PLACEMENT  Sept 2012   2nd OM with BMS  . EYE SURGERY     bilateral cataract  . HEMORROIDECTOMY    . LAMINECTOMY  05/30/2012   L 4 L5  . LAPAROSCOPIC GASTRIC BANDING    . LEFT AND RIGHT HEART CATHETERIZATION WITH CORONARY ANGIOGRAM N/A 05/07/2014   Procedure: LEFT AND RIGHT HEART CATHETERIZATION WITH CORONARY ANGIOGRAM;  Surgeon: [name] Martinique, MD;  Location: [facility] CATH LAB;  Service: Cardiovascular;  Laterality: N/A;  . ROTATOR CUFF REPAIR     both  . UVULOPALATOPHARYNGOPLASTY    . VASECTOMY      Family History  Problem Relation Age of Onset  . Heart disease Mother   . Diabetes Mother   . Other Mother      stent placement  . Emphysema Father 104  . Heart attack Sister     Social History   Social History  . Marital status: Married    Spouse name: N/A  . Number of children: 3  . Years of education: N/A   Occupational History  . Retired from [employer] Retired   Social History Main Topics  . Smoking status: Former Smoker    Packs/day: 1.50    Years: 30.00    Quit date: 01/24/1992  . Smokeless tobacco: Never Used     Comment: Stopped in 1983  . Alcohol use No  . Drug use: No  . Sexual activity: Yes   Other Topics Concern  . Not on file   Social History Narrative  . No narrative on file    History  Smoking Status  . Former Smoker  . Packs/day: 1.50  . Years: 30.00  . Quit date: 01/24/1992  Smokeless Tobacco  . Never Used    Comment: Stopped in 1983    History  Alcohol Use No     Allergies  Allergen Reactions  . Morphine Itching  . Adhesive [Tape] Itching and Rash  . Latex Itching and Rash    Bandaids    Current Outpatient Prescriptions  Medication Sig Dispense Refill  . acetaminophen (TYLENOL) 500 MG tablet Take 500 mg by mouth as needed.    Marland Kitchen albuterol (PROAIR HFA) 108 (90 Base) MCG/ACT inhaler 2 puffs every 4 hours as needed only  if your can't catch your breath (Patient taking differently: 2 puffs every 4 hours as needed for wheezing and shortness of breath) 1 Inhaler 11  . atenolol (TENORMIN) 25 MG tablet Take 25 mg by mouth every morning.     Marland Kitchen atorvastatin (LIPITOR) 20 MG tablet Take 1 tablet (20 mg total) by mouth daily. (Patient taking differently: Take 10 mg by mouth daily. ) 90 tablet 3  . budesonide-formoterol (SYMBICORT) 160-4.5 MCG/ACT inhaler Inhale 2 puffs into the lungs 2 (two) times daily. 1 Inhaler 6  . celecoxib (CELEBREX) 200 MG capsule Take 200 mg by mouth 2 (two) times daily.    Marland Kitchen dextromethorphan-guaiFENesin (MUCINEX DM) 30-600 MG 12hr tablet Take by mouth as needed.    . fluticasone (FLONASE) 50 MCG/ACT nasal spray Place into both nostrils  daily as needed (nasal  congestion).    . fluticasone (FLOVENT HFA) 110 MCG/ACT inhaler Inhale into the lungs.    . furosemide (LASIX) 80 MG tablet Take 1 tablet (80 mg total) by mouth daily. Take 80 mg in the morning and 40 mg in the afternoon.    Marland Kitchen KLOR-CON M20 20 MEQ tablet TAKE 2 TABLETS (40 MEQ TOTAL) BY MOUTH 2 (TWO) TIMES DAILY. 360 tablet 2  . losartan (COZAAR) 25 MG tablet Take 1 tablet (25 mg total) by mouth daily. 90 tablet 3  . metFORMIN (GLUCOPHAGE) 500 MG tablet Take 500 mg by mouth daily with breakfast.     . montelukast (SINGULAIR) 10 MG tablet Take 10 mg by mouth at bedtime.    . nitroGLYCERIN (NITROSTAT) 0.4 MG SL tablet Place 1 tablet (0.4 mg total) under the tongue every 5 (five) minutes as needed for chest pain. 25 tablet 6  . omeprazole (PRILOSEC) 20 MG capsule Take 20 mg by mouth daily before breakfast.    . OXYGEN Inhale into the lungs. Use CPAP at bedtime    . rivaroxaban (XARELTO) 20 MG TABS tablet Take 20 mg by mouth.    . sildenafil (VIAGRA) 100 MG tablet Take 100 mg by mouth.    . spironolactone (ALDACTONE) 25 MG tablet Take 25 mg by mouth at bedtime.     . tamsulosin (FLOMAX) 0.4 MG CAPS Take 0.4 mg by mouth daily.    Marland Kitchen tiZANidine (ZANAFLEX) 4 MG tablet Take 4 mg by mouth 3 (three) times daily as needed.  2  . topiramate (TOPAMAX) 25 MG capsule Take 2 tablets by mouth twice a day    . traMADol (ULTRAM) 50 MG tablet Take 50 mg by mouth.     No current facility-administered medications for this visit.        Review of Systems:     Cardiac Review of Systems: Y or N  Chest Pain [  n  ]  Resting SOB [n   ] Exertional SOB  Blue.Reese  ]  Orthopnea [ n ]   Pedal Edema [  n ]    Palpitations [n  ] Syncope  [ n]   Presyncope [ n  ]  General Review of Systems: [Y] = yes [  ]=no Constitional: recent weight change [ y]; anorexia [  ]; fatigue [ y ]; nausea [n  ]; night sweats [ n ]; fever [ n ]; or chills [n  ];                                                                                                                                           Dental: poor dentition[  ];   Eye : blurred vision [  ]; diplopia [   ]; vision changes [  ];  Amaurosis fugax[ n ]; Resp: cough [ n ];  wheezing[n  ];  hemoptysis[n  ]; shortness of  breath[ y ]; paroxysmal nocturnal dyspnea[  ]; dyspnea on exertion[ y ]; or orthopnea[  ];  GI:  gallstones[  ], vomiting[  ];  dysphagia[  ]; melena[  ];  hematochezia [ n ]; heartburn[  ];   Hx of  Colonoscopy[ y ]; GU: kidney stones [  ]; hematuria[  ];   dysuria [  ];  nocturia[  ];  history of     obstruction [  ];             Skin: rash, swelling[  ];, hair loss[  ];  peripheral edema[  ];  or itching[  ]; Musculosketetal: myalgias[  ];  joint swelling[  ];  joint erythema[  ];  joint pain[  ];  back pain[  ];  Heme/Lymph: bruising[  ];  bleeding[  ];  anemia[  ];  Neuro: TIA[  ];  headaches[  ];  stroke[  ];  vertigo[  ];  seizures[  ];   paresthesias[  ];  difficulty walking[n  ];  Psych:depression[  ]; anxiety[  ];  Endocrine: diabetes[  ];  thyroid dysfunction[  ];  Immunizations: Flu Blue.Reese  ]; Pneumococcal[ y];  Other:  Physical Exam: BP 101/70 (BP Location: Left Arm, Cuff Size: Normal)   Pulse 62   Resp 20   Ht 5\' 6"  (1.676 m)   Wt 279 lb 12.2 oz (126.9 kg)   SpO2 97% Comment: on RA  BMI 45.16 kg/m   General appearance: alert, cooperative, appears older than stated age and fatigued Neurologic: intact Heart: Irregular irregular pulse -afib, S1, S2 normal, no murmur, click, rub or gallop and normal apical impulse Lungs: clear to auscultation bilaterally and normal percussion bilaterally Abdomen: soft, non-tender; bowel sounds normal; no masses,  no organomegaly Extremities: Moderate bilateral edema at ankles currently Patient has no cervical or supraclavicular adenopathy   Diagnostic Studies & Laboratory data:     Recent Radiology Findings:   Ct Angio Chest Aorta W/cm &/or Wo/cm  Result Date: 10/14/2015 CLINICAL DATA:   Followup ascending aortic dilatation EXAM: CT ANGIOGRAPHY CHEST WITH CONTRAST TECHNIQUE: Multidetector CT imaging of the chest was performed using the standard protocol during bolus administration of intravenous contrast. Multiplanar CT image reconstructions and MIPs were obtained to evaluate the vascular anatomy. CONTRAST:  75 mL Isovue 370. Creatinine was obtained on site at [place] at [address]Results: Creatinine 1.2 mg/dL. COMPARISON:  09/01/2014 FINDINGS: Cardiovascular: Mild aortic calcifications are noted. Dilatation of the ascending aorta is again identified to 5 cm. Measurements are somewhat limited by patient motion artifact. Sino-tubular junction measures 4.6 cm. The aorta at the level of sinus Valsalva measures 5.5 cm. These measurements are stable from the prior exam. Normal distal tapering is seen. No dissection is identified. Bovine arch is again identified. Coronary calcifications are seen similar to that seen on the prior exam. The pulmonary artery as visualized is within normal limits. Mediastinum/Nodes: Thoracic inlet is within normal limits. No significant hilar or mediastinal adenopathy is identified. No axillary adenopathy is seen. The esophagus as visualize is within normal limits. Lungs/Pleura: Mild dependent atelectatic changes are noted. No focal infiltrate or focal nodules are seen. No sizable effusion or pneumothorax is noted. Upper Abdomen: Gastric lap band is seen in satisfactory position. The remainder the upper abdomen is within normal limits. Musculoskeletal: Degenerative changes of the thoracic spine are noted. Review of the MIP images confirms the above findings. IMPRESSION: Dilatation of the ascending aorta to 5 cm. Given some  slight variation in the imaging technique, this is felt to be relatively stable from the prior exam. Ascending thoracic aortic aneurysm. Recommend semi-annual imaging followup by CTA or MRA and referral to cardiothoracic surgery if not  already obtained. This recommendation follows 2010 ACCF/AHA/AATS/ACR/ASA/SCA/SCAI/SIR/STS/SVM Guidelines for the Diagnosis and Management of Patients With Thoracic Aortic Disease. Circulation. 2010; [id number] Otherwise stable appearance of the chest when compared with the prior exam. Electronically Signed   By: Inez Catalina M.D.   On: 10/14/2015 14:11     Ct Angio Chest Aorta W/cm &/or Wo/cm  10/01/2014   CLINICAL DATA:  Follow up thoracic aortic aneurysm. History of stent for blockage. Recently diagnosed prostate cancer.  EXAM: CT ANGIOGRAPHY CHEST WITH CONTRAST  TECHNIQUE: Multidetector CT imaging of the chest was performed using the standard protocol during bolus administration of intravenous contrast. Multiplanar CT image reconstructions and MIPs were obtained to evaluate the vascular anatomy.  CONTRAST:  75 cc Isovue 370.  COMPARISON:  09/11/2013  FINDINGS: There stable anulus mild dilation of the ascending aorta with maximum AP diameter of 4.8 cm. The diameter of the thoracic aorta at the arch is 4.4 cm. There is also a stable narrowing of the proximal descending aorta, downstream to the takeoff of the left subclavian artery, measuring 2 cm., as previously identified as possible area of coarctation. The remaining of the descending thoracic aorta is torturous but normal in caliber. There is bovine arch anatomic variation.  There is no evidence of pulmonary embolus.  The heart is normal in size. Conventional coronary artery anatomy is seen with atherosclerotic disease involving mostly the left system. There is no evidence of significant focal lung parenchymal consolidation, pleural effusion or pneumothorax. No masses are identified ; no abnormal focal contrast enhancement is seen.  No mediastinal or axillary lymphadenopathy is seen. The visualized portions of the thyroid gland are unremarkable in appearance.  The visualized portions of the liver and spleen are unremarkable.  The visualized portions of  the pancreas, stomach, adrenal glands and kidneys are within normal limits. There has been a prior cholecystectomy.  No acute osseous abnormalities are seen. Thoracic spine diffuse idiopathic skeletal hyperostosis is noted.  Review of the MIP images confirms the above findings.  IMPRESSION: Stable aneurysmal dilation of the ascending aorta with maximum AP diameter of 4.8 cm. Stable area of narrowing of the proximal most portion of the descending aorta measuring 2 cm., previously identified as an area of coarctation. No evidence of aortic dissection.  Coronary artery disease.  Normal appearance of the lungs.   Electronically Signed   By: Fidela Salisbury M.D.   On: 10/01/2014 08:50   Dg Chest 2 View  08/21/2013   CLINICAL DATA:  Followup of pneumonia. Productive cough. Congestion. Asthma. Ex-smoker.  EXAM: CHEST  2 VIEW  COMPARISON:  08/12/2013 and baseline radiograph of 05/21/2013  FINDINGS: Moderate thoracic spondylosis. Midline trachea. Moderate cardiomegaly. Pulmonary artery enlargement. Mediastinal contours otherwise within normal limits. No pleural effusion or pneumothorax. Lower lobe predominant interstitial thickening. Clearing of right lower lobe pneumonia.  IMPRESSION: Clearing of right lower lobe airspace disease/pneumonia.  Cardiomegaly with chronic interstitial thickening, likely related to smoking or chronic bronchitis.  Pulmonary artery enlargement suggests pulmonary arterial hypertension.   Electronically Signed   By: Abigail Miyamoto M.D.   On: 08/21/2013 09:47   Ct Angio Chest Aorta W/cm &/or Wo/cm  09/11/2013   CLINICAL DATA:  Thoracic aortic aneurysm.  EXAM: CT ANGIOGRAPHY CHEST WITH CONTRAST  TECHNIQUE: Multidetector CT imaging of  the chest was performed using the standard protocol during bolus administration of intravenous contrast. Multiplanar CT image reconstructions and MIPs were obtained to evaluate the vascular anatomy.  CONTRAST:  21mL OMNIPAQUE IOHEXOL 350 MG/ML SOLN  COMPARISON:   01/06/2013 and 09/14/2011  FINDINGS: Lungs are adequately inflated without consolidation effusion. There is minimal linear scarring over the anterior right upper lobe unchanged. 4 mm nodular density adjacent the minor fissure unchanged from 2013. There is borderline cardiomegaly. Continued minimal calcification over the left main, lateral circumflex and anterior descending coronary arteries. No change in a right subcarinal lymph node measuring 1 cm by short axis.  There is continued evidence of aneurysmal dilatation of the ascending thoracic aorta which measures 4.8 cm in greatest AP diameter proximally which is unchanged. The thoracic aorta measures 4 cm in diameter at the level of the proximal arch without significant change. There is a relative area of narrowing of the posterior aortic arch 1.9 cm distal to the take-off of the left subclavian artery as the aorta measures 2.1 cm in diameter focally and increases in diameter to 3.2 cm immediately distal to this narrowing. Main pulmonary artery measures 4 cm in transverse diameter unchanged. Remaining mediastinal structures are unremarkable.  Images through the upper abdomen demonstrate evidence of prior cholecystectomy as well as surgical change over the region of the gastroesophageal junction with lap band apparatus in place. There are degenerative changes of the spine.  Review of the MIP images confirms the above findings.  IMPRESSION: Stable aneurysmal dilatation of the ascending thoracic aorta measuring 4.8 cm in AP diameter. Note that there is a focal relative area of narrowing of the aortic distal to the take-off of the left subclavian artery measuring 2.1 cm in diameter likely representing a degree of coarctation unchanged. No evidence of dissection.  Stable borderline cardiomegaly.  Postsurgical changes as described.   Electronically Signed   By: Marin Olp M.D.   On: 09/11/2013 11:07    Recent Lab Findings: Lab Results  Component Value Date   WBC  5.1 05/11/2015   HGB 11.9 (L) 05/11/2015   HCT 35.1 (L) 05/11/2015   PLT 138.0 (L) 05/11/2015   GLUCOSE 129 (H) 10/14/2015   CHOL 118 01/07/2013   TRIG 133 01/07/2013   HDL 40 01/07/2013   LDLCALC 51 01/07/2013   ALT 13 01/06/2013   AST 18 01/06/2013   NA 141 10/14/2015   K 4.0 10/14/2015   CL 107 10/14/2015   CREATININE 1.11 10/14/2015   BUN 21 10/14/2015   CO2 22 10/14/2015   TSH 1.29 05/11/2015   INR 2.21 (H) 04/28/2014   HGBA1C (H) 08/19/2006    6.7 (NOTE)   The ADA recommends the following therapeutic goals for glycemic   control related to Hgb A1C measurement:   Goal of Therapy:   < 7.0% Hgb A1C   Action Suggested:  > 8.0% Hgb A1C   Ref:  Diabetes Care, 22, Suppl. 1, 1999   Aortic Size Index=    4.8    /Body surface area is 2.43 meters squared. = 1.95  < 2.75 cm/m2      4% risk per year 2.75 to 4.25          8% risk per year > 4.25 cm/m2    20% risk per year   Assessment / Plan:    Stable appearance of the ascending aorta dilatation. Still 4.5cm mid ascending aorta and sinotubular ridge the sinuses of Valsalva proximally 5.3-5.4 cm. . With the patient's  overall poor medical condition I would not recommend elective repair at this point. We'll plan to see the patient back in one year with a followup CTA of the chest  Grace Isaac MD  Beeper [phone] Office [phone] 11/04/2015 12:26 PM

## 2015-11-09 ENCOUNTER — Encounter (HOSPITAL_COMMUNITY)
Admission: RE | Admit: 2015-11-09 | Discharge: 2015-11-09 | Disposition: A | Discharge status: Home / Self Care | Payer: Medicare Other | Source: Ambulatory Visit | Attending: Pulmonary Disease | Admitting: Pulmonary Disease

## 2015-11-09 VITALS — Wt 278.9 lb

## 2015-11-09 DIAGNOSIS — J9611 Chronic respiratory failure with hypoxia: Secondary | ICD-10-CM

## 2015-11-09 DIAGNOSIS — G4733 Obstructive sleep apnea (adult) (pediatric): Secondary | ICD-10-CM | POA: Diagnosis not present

## 2015-11-09 DIAGNOSIS — R0609 Other forms of dyspnea: Secondary | ICD-10-CM | POA: Diagnosis not present

## 2015-11-09 DIAGNOSIS — Z6841 Body Mass Index (BMI) 40.0 and over, adult: Secondary | ICD-10-CM | POA: Diagnosis not present

## 2015-11-09 DIAGNOSIS — J4530 Mild persistent asthma, uncomplicated: Secondary | ICD-10-CM | POA: Diagnosis not present

## 2015-11-09 DIAGNOSIS — R5381 Other malaise: Secondary | ICD-10-CM | POA: Diagnosis not present

## 2015-11-09 NOTE — Progress Notes (Signed)
Daily Session Note  Patient Details  Name: Carlos Dawson MRN: [id number] Date of Birth: 1940-03-04 Referring Provider:   [name] Pulmonary Rehab Walk Test from 09/14/2015 in [place]  Referring Provider  Dr. Halford Chessman      Encounter Date: 11/09/2015  Check In:     Session Check In - 11/09/15 1030      Check-In   Location [facility]   Staff Present Trish Fountain, RN, BSN;Ramon Dredge, RN, MHA;Molly diVincenzo, MS, ACSM RCEP, Exercise Physiologist;Keshawn Sundberg Leonia Reeves, RN, BSN   Supervising physician immediately available to respond to emergencies [facility] immediately available   Physician(s) Dr. Alfredia Ferguson   Medication changes reported     No   Fall or balance concerns reported    No   Warm-up and Cool-down Performed as group-led instruction   Resistance Training Performed Yes   VAD Patient? No     Pain Assessment   Currently in Pain? No/denies   Multiple Pain Sites No      Capillary Blood Glucose: No results found for this or any previous visit (from the past 24 hour(s)).      Exercise Prescription Changes - 11/09/15 1200      Response to Exercise   Blood Pressure (Admit) 106/60   Blood Pressure (Exercise) 120/60   Blood Pressure (Exit) 106/60   Heart Rate (Admit) 55 bpm   Heart Rate (Exercise) 81 bpm   Heart Rate (Exit) 58 bpm   Oxygen Saturation (Admit) 97 %   Oxygen Saturation (Exercise) 93 %   Oxygen Saturation (Exit) 93 %   Rating of Perceived Exertion (Exercise) 12   Perceived Dyspnea (Exercise) 2   Duration Progress to 45 minutes of aerobic exercise without signs/symptoms of physical distress   Intensity THRR unchanged     Progression   Progression Continue to progress workloads to maintain intensity without signs/symptoms of physical distress.     Resistance Training   Training Prescription Yes   Weight blue bands   Reps 10-12  10 minutes of strength training     Interval Training   Interval Training No     Oxygen   Oxygen Continuous     NuStep   Level 4   Minutes 17   METs 2.3     Arm Ergometer   Level 5   Minutes 17     Track   Laps 12   Minutes 17     Goals Met:  Exercise tolerated well Strength training completed today  Goals Unmet:  Not Applicable  Comments: Service time is from 1030 to 1205    Dr. Rush Farmer is Medical Director for Pulmonary Rehab at [facility].

## 2015-11-11 ENCOUNTER — Encounter (HOSPITAL_COMMUNITY)
Admission: RE | Admit: 2015-11-11 | Discharge: 2015-11-11 | Disposition: A | Discharge status: Home / Self Care | Payer: Medicare Other | Source: Ambulatory Visit | Attending: Pulmonary Disease | Admitting: Pulmonary Disease

## 2015-11-11 VITALS — Wt 280.4 lb

## 2015-11-11 DIAGNOSIS — G4733 Obstructive sleep apnea (adult) (pediatric): Secondary | ICD-10-CM | POA: Diagnosis not present

## 2015-11-11 DIAGNOSIS — Z6841 Body Mass Index (BMI) 40.0 and over, adult: Secondary | ICD-10-CM | POA: Diagnosis not present

## 2015-11-11 DIAGNOSIS — J4530 Mild persistent asthma, uncomplicated: Secondary | ICD-10-CM | POA: Diagnosis not present

## 2015-11-11 DIAGNOSIS — R5381 Other malaise: Secondary | ICD-10-CM | POA: Diagnosis not present

## 2015-11-11 DIAGNOSIS — J9611 Chronic respiratory failure with hypoxia: Secondary | ICD-10-CM

## 2015-11-11 DIAGNOSIS — R0609 Other forms of dyspnea: Secondary | ICD-10-CM | POA: Diagnosis not present

## 2015-11-11 NOTE — Progress Notes (Signed)
Daily Session Note  Patient Details  Name: Carlos Dawson MRN: [id number] Date of Birth: [birth year] Referring Provider:   [name] Pulmonary Rehab Walk Test from 09/14/2015 in [place]  Referring Provider  Dr. Halford Chessman      Encounter Date: 11/11/2015  Check In:     Session Check In - 11/11/15 1030      Check-In   Location [facility]   Staff Present Rosebud Poles, RN, BSN;Carlson Belland, MS, ACSM RCEP, Exercise Physiologist;Portia Rollene Rotunda, Therapist, sports, BSN;Ramon Dredge, RN, [facility]   Supervising physician immediately available to respond to emergencies [facility] immediately available   Physician(s) Dr. Rockne Menghini   Medication changes reported     No   Fall or balance concerns reported    No   Warm-up and Cool-down Performed as group-led instruction   Resistance Training Performed Yes   VAD Patient? No     Pain Assessment   Currently in Pain? No/denies   Multiple Pain Sites No      Capillary Blood Glucose: No results found for this or any previous visit (from the past 24 hour(s)).      Exercise Prescription Changes - 11/11/15 1200      Response to Exercise   Blood Pressure (Admit) 100/60   Blood Pressure (Exercise) 122/70   Blood Pressure (Exit) 98/80   Heart Rate (Admit) 58 bpm   Heart Rate (Exercise) 76 bpm   Heart Rate (Exit) 51 bpm   Oxygen Saturation (Admit) 96 %   Oxygen Saturation (Exercise) 91 %   Oxygen Saturation (Exit) 94 %   Rating of Perceived Exertion (Exercise) 12   Perceived Dyspnea (Exercise) 1   Duration Progress to 45 minutes of aerobic exercise without signs/symptoms of physical distress   Intensity THRR unchanged     Progression   Progression Continue to progress workloads to maintain intensity without signs/symptoms of physical distress.     Resistance Training   Training Prescription Yes   Weight blue bands   Reps 10-12  10 minutes of strength training     Interval Training   Interval Training No     Oxygen   Oxygen Continuous     NuStep   Level 4   Minutes 17   METs 2.5     Track   Laps 15   Minutes 17     Goals Met:  Exercise tolerated well No report of cardiac concerns or symptoms Strength training completed today  Goals Unmet:  Not Applicable  Comments: Service time is from 10:30am to 12:00pm    Dr. Rush Farmer is Medical Director for Pulmonary Rehab at [facility].

## 2015-11-16 ENCOUNTER — Encounter (HOSPITAL_COMMUNITY): Payer: Medicare Other

## 2015-11-18 ENCOUNTER — Encounter (HOSPITAL_COMMUNITY)
Admission: RE | Admit: 2015-11-18 | Discharge: 2015-11-18 | Disposition: A | Discharge status: Home / Self Care | Payer: Medicare Other | Source: Ambulatory Visit | Attending: Pulmonary Disease | Admitting: Pulmonary Disease

## 2015-11-18 VITALS — Wt 281.1 lb

## 2015-11-18 DIAGNOSIS — R0609 Other forms of dyspnea: Secondary | ICD-10-CM | POA: Diagnosis not present

## 2015-11-18 DIAGNOSIS — G4733 Obstructive sleep apnea (adult) (pediatric): Secondary | ICD-10-CM | POA: Diagnosis not present

## 2015-11-18 DIAGNOSIS — Z6841 Body Mass Index (BMI) 40.0 and over, adult: Secondary | ICD-10-CM | POA: Diagnosis not present

## 2015-11-18 DIAGNOSIS — R5381 Other malaise: Secondary | ICD-10-CM | POA: Diagnosis not present

## 2015-11-18 DIAGNOSIS — J9611 Chronic respiratory failure with hypoxia: Secondary | ICD-10-CM

## 2015-11-18 DIAGNOSIS — J4530 Mild persistent asthma, uncomplicated: Secondary | ICD-10-CM | POA: Diagnosis not present

## 2015-11-18 NOTE — Progress Notes (Signed)
Daily Session Note  Patient Details  Name: Carlos Dawson MRN: [id number] Date of Birth: 04-30-1940 Referring Provider:   [name] Pulmonary Rehab Walk Test from 09/14/2015 in [place]  Referring Provider  Dr. Halford Chessman      Encounter Date: 11/18/2015  Check In:     Session Check In - 11/18/15 1017      Check-In   Location [facility]  Simultaneous filing. User may not have seen previous data.   Staff Present Rosebud Poles, RN, BSN;Molly diVincenzo, MS, ACSM RCEP, Exercise Physiologist;Lisa Ysidro Evert, RN;Keilynn Marano Rollene Rotunda, RN, Diplomatic Services operational officer. User may not have seen previous data.   Supervising physician immediately available to respond to emergencies [facility] immediately available  Simultaneous filing. User may not have seen previous data.   Physician(s) Dr. Alfredia Ferguson  Simultaneous filing. User may not have seen previous data.   Medication changes reported     No  Simultaneous filing. User may not have seen previous data.   Fall or balance concerns reported    No  Simultaneous filing. User may not have seen previous data.   Warm-up and Cool-down Performed as group-led instruction  Simultaneous filing. User may not have seen previous data.   Resistance Training Performed Yes  Simultaneous filing. User may not have seen previous data.   VAD Patient? No  Simultaneous filing. User may not have seen previous data.     Pain Assessment   Currently in Pain? No/denies  Simultaneous filing. User may not have seen previous data.   Multiple Pain Sites No  Simultaneous filing. User may not have seen previous data.      Capillary Blood Glucose: No results found for this or any previous visit (from the past 24 hour(s)).     POCT Glucose - 11/18/15 1259      POCT Blood Glucose   Pre-Exercise 191 mg/dL   Post-Exercise 129 mg/dL         Exercise Prescription Changes - 11/18/15 1257      Exercise Review   Progression  Yes     Response to Exercise   Blood Pressure (Admit) 102/54   Blood Pressure (Exercise) 144/64   Blood Pressure (Exit) 112/60   Heart Rate (Admit) 53 bpm   Heart Rate (Exercise) 81 bpm   Heart Rate (Exit) 87 bpm   Oxygen Saturation (Admit) 98 %   Oxygen Saturation (Exercise) 88 %   Oxygen Saturation (Exit) 94 %   Rating of Perceived Exertion (Exercise) 12   Perceived Dyspnea (Exercise) 1   Duration Progress to 45 minutes of aerobic exercise without signs/symptoms of physical distress   Intensity THRR unchanged     Progression   Progression Continue to progress workloads to maintain intensity without signs/symptoms of physical distress.     Resistance Training   Training Prescription Yes   Weight blue bands   Reps 10-12  10 minutes of strength training     Interval Training   Interval Training No     Oxygen   Oxygen Continuous     NuStep   Level 5   Minutes 17   METs 1.9     Track   Laps 17   Minutes 17     Goals Met:  Independence with exercise equipment Improved SOB with ADL's Using PLB without cueing & demonstrates good technique Exercise tolerated well No report of cardiac concerns or symptoms Strength training completed today  Goals Unmet:  Not Applicable  Comments:  Service time is from 1030 to 1230. Patient also attended a question and answer session with the pulmonary rehab medical director, Dr. Ellin Goodie   Dr. Rush Farmer is Medical Director for Pulmonary Rehab at [facility].

## 2015-11-23 ENCOUNTER — Encounter (HOSPITAL_COMMUNITY): Payer: Medicare Other

## 2015-11-25 ENCOUNTER — Encounter (HOSPITAL_COMMUNITY): Payer: Medicare Other

## 2015-11-30 ENCOUNTER — Encounter (HOSPITAL_COMMUNITY)
Admission: RE | Admit: 2015-11-30 | Discharge: 2015-11-30 | Disposition: A | Discharge status: Home / Self Care | Payer: Medicare Other | Source: Ambulatory Visit | Attending: Pulmonary Disease | Admitting: Pulmonary Disease

## 2015-11-30 DIAGNOSIS — J4530 Mild persistent asthma, uncomplicated: Secondary | ICD-10-CM | POA: Insufficient documentation

## 2015-11-30 DIAGNOSIS — J9611 Chronic respiratory failure with hypoxia: Secondary | ICD-10-CM | POA: Insufficient documentation

## 2015-11-30 DIAGNOSIS — Z6841 Body Mass Index (BMI) 40.0 and over, adult: Secondary | ICD-10-CM | POA: Insufficient documentation

## 2015-11-30 DIAGNOSIS — R5381 Other malaise: Secondary | ICD-10-CM | POA: Insufficient documentation

## 2015-11-30 DIAGNOSIS — R0609 Other forms of dyspnea: Secondary | ICD-10-CM | POA: Insufficient documentation

## 2015-11-30 DIAGNOSIS — G4733 Obstructive sleep apnea (adult) (pediatric): Secondary | ICD-10-CM | POA: Insufficient documentation

## 2015-11-30 NOTE — Progress Notes (Signed)
Pulmonary Individual Treatment Plan  Patient Details  Name: Carlos Dawson MRN: [id number] Date of Birth: Sep 17, 1940 Referring Provider:   [name] Pulmonary Rehab Walk Test from 09/14/2015 in [place]  Referring Provider  Dr. Halford Chessman      Initial Encounter Date:  Flowsheet Row Pulmonary Rehab Walk Test from 09/14/2015 in [place]  Date  09/14/15  Referring Provider  Dr. Halford Chessman      Visit Diagnosis: Chronic respiratory failure with hypoxia ([place])  Patient's Home Medications on Admission:   Current Outpatient Prescriptions:  .  acetaminophen (TYLENOL) 500 MG tablet, Take 500 mg by mouth as needed., Disp: , Rfl:  .  albuterol (PROAIR HFA) 108 (90 Base) MCG/ACT inhaler, 2 puffs every 4 hours as needed only  if your can't catch your breath (Patient taking differently: 2 puffs every 4 hours as needed for wheezing and shortness of breath), Disp: 1 Inhaler, Rfl: 11 .  atenolol (TENORMIN) 25 MG tablet, Take 25 mg by mouth every morning. , Disp: , Rfl:  .  atorvastatin (LIPITOR) 20 MG tablet, Take 1 tablet (20 mg total) by mouth daily. (Patient taking differently: Take 10 mg by mouth daily. ), Disp: 90 tablet, Rfl: 3 .  budesonide-formoterol (SYMBICORT) 160-4.5 MCG/ACT inhaler, Inhale 2 puffs into the lungs 2 (two) times daily., Disp: 1 Inhaler, Rfl: 6 .  celecoxib (CELEBREX) 200 MG capsule, Take 200 mg by mouth 2 (two) times daily., Disp: , Rfl:  .  dextromethorphan-guaiFENesin (MUCINEX DM) 30-600 MG 12hr tablet, Take by mouth as needed., Disp: , Rfl:  .  fluticasone (FLONASE) 50 MCG/ACT nasal spray, Place into both nostrils daily as needed (nasal congestion)., Disp: , Rfl:  .  fluticasone (FLOVENT HFA) 110 MCG/ACT inhaler, Inhale into the lungs., Disp: , Rfl:  .  furosemide (LASIX) 80 MG tablet, Take 1 tablet (80 mg total) by mouth daily. Take 80 mg in the morning and 40 mg in the afternoon., Disp: , Rfl:  .  KLOR-CON M20 20  MEQ tablet, TAKE 2 TABLETS (40 MEQ TOTAL) BY MOUTH 2 (TWO) TIMES DAILY., Disp: 360 tablet, Rfl: 2 .  losartan (COZAAR) 25 MG tablet, Take 1 tablet (25 mg total) by mouth daily., Disp: 90 tablet, Rfl: 3 .  metFORMIN (GLUCOPHAGE) 500 MG tablet, Take 500 mg by mouth daily with breakfast. , Disp: , Rfl:  .  montelukast (SINGULAIR) 10 MG tablet, Take 10 mg by mouth at bedtime., Disp: , Rfl:  .  nitroGLYCERIN (NITROSTAT) 0.4 MG SL tablet, Place 1 tablet (0.4 mg total) under the tongue every 5 (five) minutes as needed for chest pain., Disp: 25 tablet, Rfl: 6 .  omeprazole (PRILOSEC) 20 MG capsule, Take 20 mg by mouth daily before breakfast., Disp: , Rfl:  .  OXYGEN, Inhale into the lungs. Use CPAP at bedtime, Disp: , Rfl:  .  rivaroxaban (XARELTO) 20 MG TABS tablet, Take 20 mg by mouth., Disp: , Rfl:  .  sildenafil (VIAGRA) 100 MG tablet, Take 100 mg by mouth., Disp: , Rfl:  .  spironolactone (ALDACTONE) 25 MG tablet, Take 25 mg by mouth at bedtime. , Disp: , Rfl:  .  tamsulosin (FLOMAX) 0.4 MG CAPS, Take 0.4 mg by mouth daily., Disp: , Rfl:  .  tiZANidine (ZANAFLEX) 4 MG tablet, Take 4 mg by mouth 3 (three) times daily as needed., Disp: , Rfl: 2 .  topiramate (TOPAMAX) 25 MG capsule, Take 2 tablets by mouth twice a day,  Disp: , Rfl:  .  traMADol (ULTRAM) 50 MG tablet, Take 50 mg by mouth., Disp: , Rfl:   Past Medical History: Past Medical History:  Diagnosis Date  . Anticoagulant long-term use   . Aortic root enlargement ([place])   . Ascending aortic aneurysm [facility])    recent scan in October 2012 showing no change; followed by Dr. Servando Snare  . ASCVD (arteriosclerotic cardiovascular disease)    Prior BMS to the 2nd OM in September 2012; with repeat cath in October showing patency  . CAD (coronary artery disease)    a. s/p BMS to 2nd OM in Sept 2012; b. LexiScan Myoview (12/2012):  Inf infarct; bowel and motion artifact make study difficult to interpret; no ischemia; not gated; Low Risk  . CHF  (congestive heart failure) ([place])    no recent issues 10/13/14  . Colonic polyp   . Contact lens/glasses fitting   . Diabetes mellitus without complication (HCC)    metphormin, average 154  . Diastolic dysfunction   . Generalized headaches    neck stenosis  . GERD (gastroesophageal reflux disease)   . Hearing loss   . Hemorrhoids   . Hypertension   . IBS (irritable bowel syndrome)   . LVH (left ventricular hypertrophy)   . Mild intermittent asthma   . OA (osteoarthritis)   . Obesities, morbid ([place])   . OSA (obstructive sleep apnea)    PSG 03/30/97 AHI 21, BPAP 13/9  . PAF (paroxysmal atrial fibrillation) ([place])    treated with Coumadin  . Prostate CA [facility])    Oncologist  DR. Daralene [facility] 09/24/14, undetermined tx   . Pulmonary embolism ([place])    2008  . Sleep apnea   . SOB (shortness of breath)    on excertion  . Thoracic aortic aneurysm (HCC)    Aortic Size Index=     5.0    /Body surface area is 2.43 meters squared. = 2.05  < 2.75 cm/m2      4% risk per year 2.75 to 4.25          8% risk per year > 4.25 cm/m2    20% risk per year   Stable aneurysmal dilation of the ascending aorta with maximum AP diameter of 4.8 cm. Stable area of narrowing of the proximal most portion of the descending aorta measuring 2 cm., previously identified as an area of coarctation. No evidence of aortic dissection.  Coronary artery disease.  Normal appearance of the lungs.   Electronically Signed   By: Fidela Salisbury M.D.   On: 10/01/2014 08:50      Tobacco Use: History  Smoking Status  . Former Smoker  . Packs/day: 1.50  . Years: 30.00  . Quit date: 01/24/1992  Smokeless Tobacco  . Never Used    Comment: Stopped in 1983    Labs: Recent Review Flowsheet Data    Labs for ITP Cardiac and Pulmonary Rehab Latest Ref Rng & Units 02/26/2010 11/20/2011 01/07/2013 05/07/2014 05/07/2014   Cholestrol 0 - 200 mg/dL - 124 118 - -   LDLCALC 0 - 99 mg/dL - 62 51 - -   HDL >39 mg/dL - 41.10 40 - -    Trlycerides <150 mg/dL - 104.0 133 - -   PHART 7.350 - 7.450 - - - - 7.336(L)   PCO2ART 35.0 - 45.0 mmHg - - - - 42.3   HCO3 20.0 - 24.0 mEq/L - - - 23.0 22.6   TCO2 0 - 100 mmol/L 25 - -  24 24   ACIDBASEDEF 0.0 - 2.0 mmol/L - - - 3.0(H) 3.0(H)   O2SAT % - - - 61.0 91.0      Capillary Blood Glucose: Lab Results  Component Value Date   GLUCAP 116 (H) 12/29/2014   GLUCAP 113 (H) 05/07/2014   GLUCAP 135 (H) 05/07/2014   GLUCAP 95 01/28/2013   GLUCAP 97 06/01/2012       POCT Glucose    Row Name 09/21/15 1212 09/23/15 1211 09/28/15 1227 09/30/15 1227 10/07/15 1210     POCT Blood Glucose   Pre-Exercise 176 mg/dL 218 mg/dL 172 mg/dL 212 mg/dL 231 mg/dL   Post-Exercise 137 mg/dL 159 mg/dL 144 mg/dL 148 mg/dL 208 mg/dL   Row Name 10/19/15 1226 10/26/15 1222 10/28/15 1234 11/11/15 1210 11/18/15 1259     POCT Blood Glucose   Pre-Exercise 159 mg/dL 249 mg/dL 244 mg/dL 170 mg/dL 191 mg/dL   Post-Exercise 158 mg/dL 230 mg/dL 136 mg/dL 131 mg/dL 129 mg/dL      ADL UCSD:   Pulmonary Function Assessment:     Pulmonary Function Assessment - 09/13/15 1106      Breath   Bilateral Breath Sounds Clear      Exercise Target Goals:    Exercise Program Goal: Individual exercise prescription set with THRR, safety & activity barriers. Participant demonstrates ability to understand and report RPE using BORG scale, to self-measure pulse accurately, and to acknowledge the importance of the exercise prescription.  Exercise Prescription Goal: Starting with aerobic activity 30 plus minutes a day, 3 days per week for initial exercise prescription. Provide home exercise prescription and guidelines that participant acknowledges understanding prior to discharge.  Activity Barriers & Risk Stratification:     Activity Barriers & Cardiac Risk Stratification - 09/13/15 1104      Activity Barriers & Cardiac Risk Stratification   Activity Barriers Deconditioning;Shortness of Breath;Back  Problems;Neck/Spine Problems      6 Minute Walk:     6 Minute Walk    Row Name 09/14/15 1626         6 Minute Walk   Phase Initial     Distance 1210 feet     Walk Time 6 minutes     # of Rest Breaks 0     MPH 2.29     METS 2.68     RPE 13     Perceived Dyspnea  2     Symptoms Yes (comment)     Comments leg fatigue     Resting HR 75 bpm     Resting BP 120/60     Max Ex. HR 104 bpm     Max Ex. BP 148/62     2 Minute Post BP 126/60       Interval HR   Baseline HR 75     1 Minute HR 85     2 Minute HR 92     3 Minute HR 93     4 Minute HR 97     5 Minute HR 104     6 Minute HR 94     2 Minute Post HR 85     Interval Heart Rate? Yes       Interval Oxygen   Interval Oxygen? Yes     Baseline Oxygen Saturation % 96 %     Baseline Liters of Oxygen 2 L     1 Minute Oxygen Saturation % 95 %     1 Minute Liters of Oxygen 2 L  2 Minute Oxygen Saturation % 93 %     2 Minute Liters of Oxygen 2 L     3 Minute Oxygen Saturation % 91 %     3 Minute Liters of Oxygen 2 L     4 Minute Oxygen Saturation % 91 %     4 Minute Liters of Oxygen 2 L     5 Minute Oxygen Saturation % 90 %     5 Minute Liters of Oxygen 2 L     6 Minute Oxygen Saturation % 90 %     6 Minute Liters of Oxygen 2 L     2 Minute Post Oxygen Saturation % 93 %     2 Minute Post Liters of Oxygen 2 L        Initial Exercise Prescription:     Initial Exercise Prescription - 09/14/15 1600      Date of Initial Exercise RX and Referring Provider   Date 09/14/15   Referring Provider Dr. Halford Chessman     Oxygen   Oxygen Continuous   Liters 2     NuStep   Level 1   Minutes 17   METs 1.5     Arm Ergometer   Level 1   Minutes 17     Track   Laps 5   Minutes 17     Prescription Details   Frequency (times per week) 2   Duration Progress to 45 minutes of aerobic exercise without signs/symptoms of physical distress     Intensity   THRR 40-80% of Max Heartrate 59-118   Ratings of Perceived Exertion  11-13   Perceived Dyspnea 0-4     Progression   Progression Continue progressive overload as per policy without signs/symptoms or physical distress.     Resistance Training   Training Prescription Yes   Weight blue bands   Reps 10-12      Perform Capillary Blood Glucose checks as needed.  Exercise Prescription Changes:     Exercise Prescription Changes    Row Name 09/21/15 1200 09/23/15 1200 09/28/15 1200 09/30/15 1224 10/05/15 1200     Exercise Review   Progression  - Yes  -  -  -     Response to Exercise   Blood Pressure (Admit) 102/66 110/52 104/56 100/60 110/60   Blood Pressure (Exercise) 132/100 112/54 120/62 110/60  -   Blood Pressure (Exit) 110/60 104/50 1'00/58 98/50 98/64 '$   Heart Rate (Admit) 62 bpm 59 bpm 58 bpm 60 bpm 68 bpm   Heart Rate (Exercise) 89 bpm 79 bpm 88 bpm 78 bpm  -   Heart Rate (Exit) 70 bpm 65 bpm 66 bpm 62 bpm 59 bpm   Oxygen Saturation (Admit) 93 % 97 % 95 % 96 % 97 %   Oxygen Saturation (Exercise) 88 % 93 % 91 % 91 %  -   Oxygen Saturation (Exit) 95 % 93 % 92 % 95 % 95 %   Rating of Perceived Exertion (Exercise) 12  -  - 13 13   Perceived Dyspnea (Exercise) 1  -  - 1 3   Duration Progress to 45 minutes of aerobic exercise without signs/symptoms of physical distress Progress to 45 minutes of aerobic exercise without signs/symptoms of physical distress Progress to 45 minutes of aerobic exercise without signs/symptoms of physical distress Progress to 45 minutes of aerobic exercise without signs/symptoms of physical distress  -   Intensity Other (comment)  HRR 40-80% THRR unchanged THRR unchanged THRR unchanged  -  Progression   Progression Continue to progress workloads to maintain intensity without signs/symptoms of physical distress. Continue to progress workloads to maintain intensity without signs/symptoms of physical distress. Continue to progress workloads to maintain intensity without signs/symptoms of physical distress. Continue to progress  workloads to maintain intensity without signs/symptoms of physical distress.  -     Resistance Training   Training Prescription Yes Yes Yes Yes  -   Weight blue bands blue bands blue bands blue bands  -   Reps 10-12 10-12  10 minutes of strength training 10-12  10 minutes of strength training 10-12  10 minutes of strength training  -     Interval Training   Interval Training  -  - No No  -     Oxygen   Oxygen Continuous Continuous Continuous Continuous Continuous   Liters '2 2 2 2 2     '$ NuStep   Level 2  - 2 3  -   Minutes 17  - 17 17  -   METs 1.7  - 1.8 2  -     Arm Ergometer   Level '1 2 1  '$ -  -   Minutes '17 17 17  '$ -  -     Track   Laps '13 11 16 15 11   '$ Minutes '17 17 17 17 17   '$ Row Name 10/07/15 1200 10/19/15 1200 10/21/15 1200 10/26/15 1200 10/28/15 1200     Exercise Review   Progression Yes Yes  - Yes  -     Response to Exercise   Blood Pressure (Admit) '94/50 88/50 90/46 '$ 110/56 100/56   Blood Pressure (Exercise) 110/64 98/68 132/74 104/60 142/70   Blood Pressure (Exit) 98/64 100/58 100/60 90/60  recheck BP after water 106/64 92/80   Heart Rate (Admit) 57 bpm 61 bpm 58 bpm 54 bpm 55 bpm   Heart Rate (Exercise) 64 bpm 70 bpm 74 bpm 84 bpm 88 bpm   Heart Rate (Exit) 61 bpm 57 bpm 58 bpm 60 bpm 62 bpm   Oxygen Saturation (Admit) 96 % 96 % 97 % 97 % 98 %   Oxygen Saturation (Exercise) 89 % 96 % 94 % 91 % 90 %   Oxygen Saturation (Exit) 93 % 57 % 94 % 94 % 94 %   Rating of Perceived Exertion (Exercise) '12 13 12 13 12   '$ Perceived Dyspnea (Exercise) '1 3 3 2 1   '$ Duration Progress to 45 minutes of aerobic exercise without signs/symptoms of physical distress Progress to 45 minutes of aerobic exercise without signs/symptoms of physical distress Progress to 45 minutes of aerobic exercise without signs/symptoms of physical distress Progress to 45 minutes of aerobic exercise without signs/symptoms of physical distress Progress to 45 minutes of aerobic exercise without  signs/symptoms of physical distress   Intensity THRR unchanged THRR unchanged THRR unchanged THRR unchanged THRR unchanged     Progression   Progression Continue progressive overload as per policy without signs/symptoms or physical distress. Continue to progress workloads to maintain intensity without signs/symptoms of physical distress. Continue to progress workloads to maintain intensity without signs/symptoms of physical distress. Continue to progress workloads to maintain intensity without signs/symptoms of physical distress. Continue to progress workloads to maintain intensity without signs/symptoms of physical distress.     Resistance Training   Training Prescription Yes Yes Yes Yes Yes   Weight blue bands blue bands blue bands blue bands blue bands   Reps 10-12 10-12  10 min 10-12  10  min 10-12  10 minutes of strength training 10-12  10 minutes of strength training     Interval Training   Interval Training No No No No No     Oxygen   Oxygen Continuous Continuous Continuous Continuous Continuous   Liters '2 2 2 2 2     '$ NuStep   Level  - '4 4 4  '$ -   Minutes  - '17 17 17  '$ -   METs  - 1.9 2 2.2  -     Arm Ergometer   Level 3 4  - 5 5   Minutes 17 17  - 17 17     Track   Laps 10 7  RD consult for 5 min 12  RD consult for 5 min 11 14   Minutes '17 10 17 17 17     '$ Home Exercise Plan   Plans to continue exercise at  - Home  -  -  -   Frequency  - Add 3 additional days to program exercise sessions.  -  -  -   Row Name 11/09/15 1200 11/11/15 1200 11/18/15 1257         Exercise Review   Progression  -  - Yes       Response to Exercise   Blood Pressure (Admit) 106/60 100/60 102/54     Blood Pressure (Exercise) 120/60 122/70 144/64     Blood Pressure (Exit) 106/60 98/80 112/60     Heart Rate (Admit) 55 bpm 58 bpm 53 bpm     Heart Rate (Exercise) 81 bpm 76 bpm 81 bpm     Heart Rate (Exit) 58 bpm 51 bpm 87 bpm     Oxygen Saturation (Admit) 97 % 96 % 98 %     Oxygen  Saturation (Exercise) 93 % 91 % 88 %     Oxygen Saturation (Exit) 93 % 94 % 94 %     Rating of Perceived Exertion (Exercise) '12 12 12     '$ Perceived Dyspnea (Exercise) '2 1 1     '$ Duration Progress to 45 minutes of aerobic exercise without signs/symptoms of physical distress Progress to 45 minutes of aerobic exercise without signs/symptoms of physical distress Progress to 45 minutes of aerobic exercise without signs/symptoms of physical distress     Intensity THRR unchanged THRR unchanged THRR unchanged       Progression   Progression Continue to progress workloads to maintain intensity without signs/symptoms of physical distress. Continue to progress workloads to maintain intensity without signs/symptoms of physical distress. Continue to progress workloads to maintain intensity without signs/symptoms of physical distress.       Resistance Training   Training Prescription Yes Yes Yes     Weight blue bands blue bands blue bands     Reps 10-12  10 minutes of strength training 10-12  10 minutes of strength training 10-12  10 minutes of strength training       Interval Training   Interval Training No No No       Oxygen   Oxygen Continuous Continuous Continuous       NuStep   Level '4 4 5     '$ Minutes '17 17 17     '$ METs 2.3 2.5 1.9       Arm Ergometer   Level 5  -  -     Minutes 17  -  -       Track   Laps 12 15 17  Minutes '17 17 17        '$ Exercise Comments:     Exercise Comments    Row Name 10/04/15 1137 10/19/15 1707 11/01/15 0940 11/30/15 0714     Exercise Comments Patient has completed 4 sessons. Two workload increases. Will cont. to monitor.  home exercise completed After I completed home exercise with patient and discussed goals, I can tell a difference in how hard the patient works each time he comes to class. He has really stepped up his intensity. Will cont. to monitor.  Patient is not exercising at home. Only performing ADL's. Discussed with patient how important home  exercise is. Patient stated understanding. Patient is in [place] for 2 weeks. Will f/u when patient returns.        Discharge Exercise Prescription (Final Exercise Prescription Changes):     Exercise Prescription Changes - 11/18/15 1257      Exercise Review   Progression Yes     Response to Exercise   Blood Pressure (Admit) 102/54   Blood Pressure (Exercise) 144/64   Blood Pressure (Exit) 112/60   Heart Rate (Admit) 53 bpm   Heart Rate (Exercise) 81 bpm   Heart Rate (Exit) 87 bpm   Oxygen Saturation (Admit) 98 %   Oxygen Saturation (Exercise) 88 %   Oxygen Saturation (Exit) 94 %   Rating of Perceived Exertion (Exercise) 12   Perceived Dyspnea (Exercise) 1   Duration Progress to 45 minutes of aerobic exercise without signs/symptoms of physical distress   Intensity THRR unchanged     Progression   Progression Continue to progress workloads to maintain intensity without signs/symptoms of physical distress.     Resistance Training   Training Prescription Yes   Weight blue bands   Reps 10-12  10 minutes of strength training     Interval Training   Interval Training No     Oxygen   Oxygen Continuous     NuStep   Level 5   Minutes 17   METs 1.9     Track   Laps 17   Minutes 17       Nutrition:  Target Goals: Understanding of nutrition guidelines, daily intake of sodium '1500mg'$ , cholesterol '200mg'$ , calories 30% from fat and 7% or less from saturated fats, daily to have 5 or more servings of fruits and vegetables.  Biometrics:     Pre Biometrics - 09/13/15 1112      Pre Biometrics   Grip Strength 40 kg       Nutrition Therapy Plan and Nutrition Goals:     Nutrition Therapy & Goals - 10/07/15 1504      Nutrition Therapy   Diet Carb Modified, Therapeutic Lifestyle Changes     Personal Nutrition Goals   Personal Goal #1 1-2 lb wt loss/week to a wt loss goal of 6-24 lb at graduation     [place], educate and counsel  regarding individualized specific dietary modifications aiming towards targeted core components such as weight, hypertension, lipid management, diabetes, heart failure and other comorbidities.   Expected Outcomes Short Term Goal: Understand basic principles of dietary content, such as calories, fat, sodium, cholesterol and nutrients.;Long Term Goal: Adherence to prescribed nutrition plan.      Nutrition Discharge: Rate Your Plate Scores:     Nutrition Assessments - 10/19/15 1202      Rate Your Plate Scores   Pre Score 46      Psychosocial: Target Goals: Acknowledge presence or absence of  depression, maximize coping skills, provide positive support system. Participant is able to verbalize types and ability to use techniques and skills needed for reducing stress and depression.  Initial Review & Psychosocial Screening:     Initial Psych Review & Screening - 09/13/15 [place]? Yes     Barriers   Psychosocial barriers to participate in program There are no identifiable barriers or psychosocial needs.     Screening Interventions   Interventions Encouraged to exercise      Quality of Life Scores:   PHQ-9: Recent Review Flowsheet Data    Depression screen [facility] 2/9 09/13/2015   Decreased Interest 0   Down, Depressed, Hopeless 0   PHQ - 2 Score 0      Psychosocial Evaluation and Intervention:     Psychosocial Evaluation - 09/13/15 1110      Psychosocial Evaluation & Interventions   Interventions Encouraged to exercise with the program and follow exercise prescription      Psychosocial Re-Evaluation:     Psychosocial Re-Evaluation    [place] Name 10/05/15 3614 11/02/15 1008 11/30/15 0654         Psychosocial Re-Evaluation   Interventions Encouraged to attend Pulmonary Rehabilitation for the exercise Encouraged to attend Pulmonary Rehabilitation for the exercise Encouraged to attend Pulmonary Rehabilitation for the exercise      Comments there are no psychosocial barriers identified in the last 30 days there are no psychosocial barriers identified in the last 30 days there are no psychosocial barriers identified in the last 30 days       Education: Education Goals: Education classes will be provided on a weekly basis, covering required topics. Participant will state understanding/return demonstration of topics presented.  Learning Barriers/Preferences:     Learning Barriers/Preferences - 09/13/15 1105      Learning Barriers/Preferences   Learning Barriers None   Learning Preferences Written Material;Individual Instruction;Group Instruction;Computer/Internet      Education Topics: Risk Factor Reduction:  -Group instruction that is supported by a PowerPoint presentation. Instructor discusses the definition of a risk factor, different risk factors for pulmonary disease, and how the heart and lungs work together.     Nutrition for Pulmonary Patient:  -Group instruction provided by PowerPoint slides, verbal discussion, and written materials to support subject matter. The instructor gives an explanation and review of healthy diet recommendations, which includes a discussion on weight management, recommendations for fruit and vegetable consumption, as well as protein, fluid, caffeine, fiber, sodium, sugar, and alcohol. Tips for eating when patients are short of breath are discussed.   Pursed Lip Breathing:  -Group instruction that is supported by demonstration and informational handouts. Instructor discusses the benefits of pursed lip and diaphragmatic breathing and detailed demonstration on how to preform both.   Flowsheet Row PULMONARY REHAB OTHER RESPIRATORY from 11/11/2015 in [place]  Date  11/04/15  Educator  EP  Instruction Review Code  R- Review/reinforce      Oxygen Safety:  -Group instruction provided by PowerPoint, verbal discussion, and written material to support  subject matter. There is an overview of "What is Oxygen" and "Why do we need it".  Instructor also reviews how to create a safe environment for oxygen use, the importance of using oxygen as prescribed, and the risks of noncompliance. There is a brief discussion on traveling with oxygen and resources the patient may utilize.   Oxygen Equipment:  -Group instruction provided by [facility]  Staff utilizing handouts, written materials, and Insurance underwriter.   Signs and Symptoms:  -Group instruction provided by written material and verbal discussion to support subject matter. Warning signs and symptoms of infection, stroke, and heart attack are reviewed and when to call the physician/911 reinforced. Tips for preventing the spread of infection discussed. Flowsheet Row PULMONARY REHAB OTHER RESPIRATORY from 11/11/2015 in [place]  Date  10/21/15  Educator  rn  Instruction Review Code  2- meets goals/outcomes      Advanced Directives:  -Group instruction provided by verbal instruction and written material to support subject matter. Instructor reviews Advanced Directive laws and proper instruction for filling out document.   Pulmonary Video:  -Group video education that reviews the importance of medication and oxygen compliance, exercise, good nutrition, pulmonary hygiene, and pursed lip and diaphragmatic breathing for the pulmonary patient. Flowsheet Row PULMONARY REHAB OTHER RESPIRATORY from 11/11/2015 in [place]  Date  11/11/15  Educator  video  Instruction Review Code  2- meets goals/outcomes      Exercise for the Pulmonary Patient:  -Group instruction that is supported by a PowerPoint presentation. Instructor discusses benefits of exercise, core components of exercise, frequency, duration, and intensity of an exercise routine, importance of utilizing pulse oximetry during exercise, safety while exercising, and  options of places to exercise outside of rehab.   Flowsheet Row PULMONARY REHAB OTHER RESPIRATORY from 11/11/2015 in [place]  Date  10/14/15  Educator  ep  Instruction Review Code  2- meets goals/outcomes      Pulmonary Medications:  -Verbally interactive group education provided by instructor with focus on inhaled medications and proper administration.   Anatomy and Physiology of the Respiratory System and Intimacy:  -Group instruction provided by PowerPoint, verbal discussion, and written material to support subject matter. Instructor reviews respiratory cycle and anatomical components of the respiratory system and their functions. Instructor also reviews differences in obstructive and restrictive respiratory diseases with examples of each. Intimacy, Sex, and Sexuality differences are reviewed with a discussion on how relationships can change when diagnosed with pulmonary disease. Common sexual concerns are reviewed.   Knowledge Questionnaire Score:   Core Components/Risk Factors/Patient Goals at Admission:     Personal Goals and Risk Factors at Admission - 09/13/15 1108      Core Components/Risk Factors/Patient Goals on Admission    Weight Management Yes;Obesity   Intervention Weight Management: Develop a combined nutrition and exercise program designed to reach desired caloric intake, while maintaining appropriate intake of nutrient and fiber, sodium and fats, and appropriate energy expenditure required for the weight goal.;Weight Management: Provide education and appropriate resources to help participant work on and attain dietary goals.;Weight Management/Obesity: Establish reasonable short term and long term weight goals.;Obesity: Provide education and appropriate resources to help participant work on and attain dietary goals.   Expected Outcomes Short Term: Continue to assess and modify interventions until short term weight is achieved;Weight Loss:  Understanding of general recommendations for a balanced deficit meal plan, which promotes 1-2 lb weight loss per week and includes a negative energy balance of [phone] kcal/d;Understanding recommendations for meals to include 15-35% energy as protein, 25-35% energy from fat, 35-60% energy from carbohydrates, less than '200mg'$  of dietary cholesterol, 20-35 gm of total fiber daily;Understanding of distribution of calorie intake throughout the day with the consumption of 4-5 meals/snacks   Increase Strength and Stamina Yes   Intervention Provide advice, education, support and counseling  about physical activity/exercise needs.;Develop an individualized exercise prescription for aerobic and resistive training based on initial evaluation findings, risk stratification, comorbidities and participant's personal goals.   Expected Outcomes Achievement of increased cardiorespiratory fitness and enhanced flexibility, muscular endurance and strength shown through measurements of functional capacity and personal statement of participant.   Improve shortness of breath with ADL's Yes   Intervention Provide education, individualized exercise plan and daily activity instruction to help decrease symptoms of SOB with activities of daily living.   Expected Outcomes Short Term: Achieves a reduction of symptoms when performing activities of daily living.   Develop more efficient breathing techniques such as purse lipped breathing and diaphragmatic breathing; and practicing self-pacing with activity Yes   Intervention Provide education, demonstration and support about specific breathing techniuqes utilized for more efficient breathing. Include techniques such as pursed lipped breathing, diaphragmatic breathing and self-pacing activity.   Expected Outcomes Short Term: Participant will be able to demonstrate and use breathing techniques as needed throughout daily activities.      Core Components/Risk Factors/Patient Goals Review:       Goals and Risk Factor Review    Row Name 10/05/15 0831 11/02/15 1008 11/30/15 0654         Core Components/Risk Factors/Patient Goals Review   Personal Goals Review Weight Management/Obesity;Increase Strength and Stamina;Develop more efficient breathing techniques such as purse lipped breathing and diaphragmatic breathing and practicing self-pacing with activity.;Improve shortness of breath with ADL's Weight Management/Obesity;Increase Strength and Stamina;Develop more efficient breathing techniques such as purse lipped breathing and diaphragmatic breathing and practicing self-pacing with activity.;Improve shortness of breath with ADL's Weight Management/Obesity;Increase Strength and Stamina;Develop more efficient breathing techniques such as purse lipped breathing and diaphragmatic breathing and practicing self-pacing with activity.;Improve shortness of breath with ADL's     Review see comments section on ITP see comments section on ITP see comments section on ITP     Expected Outcomes see admission expected outcomes see admission expected outcomes see admission expected outcomes        Core Components/Risk Factors/Patient Goals at Discharge (Final Review):      Goals and Risk Factor Review - 11/30/15 0654      Core Components/Risk Factors/Patient Goals Review   Personal Goals Review Weight Management/Obesity;Increase Strength and Stamina;Develop more efficient breathing techniques such as purse lipped breathing and diaphragmatic breathing and practicing self-pacing with activity.;Improve shortness of breath with ADL's   Review see comments section on ITP   Expected Outcomes see admission expected outcomes      ITP Comments:   Comments: ITP REVIEW Pt is making expected progress toward pulmonary rehab goals after completing 15 sessions. He has missed several session over the last 30 days related to MD appointments. Most recently he is out of town for the next 2 weeks on a mission  trip to [place]. He plans to walk for exercise while there. Recommend continued exercise, life style modification, education, and utilization of breathing techniques to increase stamina and strength and decrease shortness of breath with exertion.

## 2015-12-02 ENCOUNTER — Encounter (HOSPITAL_COMMUNITY): Payer: Medicare Other

## 2015-12-02 NOTE — Progress Notes (Signed)
Carlos Dawson Date of Birth: September 01, 1940  Medical Record [id number]  History of Present Illness: Carlos Dawson is seen today for follow up CAD and diastolic CHF. He has multiple medical issues including CAD, thoracic aneurysm, history of pulmonary emboli, morbid obesity with prior lap banding, HTN, chronic edema and PAF. He he is on chronic Xarelto. He underwent stenting of the obtuse marginal vessel in September of 2012.  Last CT of the chest September 2016 showed stable 4.8 cm aneurysm. This is followed by Dr. Servando Snare. He was seen in October and CT showed stable aneurysm size.  Cardiac in April 2016 cath showed no significant CAD. LV function was good. LV filling pressures were high and he had mild pulmonary HTN. Lasix dose was increased.  On follow up today he reports his breathing is still his biggest problem. Still notes SOB  when climbing stairs or walking up incline. Has sporadic chest pressure.  He is monitoring his salt intake but did just return from a trip to [place] this am and had a lot of salt there. He has been treated for  Prostate CA Gleason stage 7 with RT treatments x 29. Last PSA 0.02. He is still using CPAP and followed by Dr. Halford Chessman and has been participating in pulmonary Rehab.   Current Outpatient Prescriptions on File Prior to Visit  Medication Sig Dispense Refill  . acetaminophen (TYLENOL) 500 MG tablet Take 500 mg by mouth as needed.    Marland Kitchen albuterol (PROAIR HFA) 108 (90 Base) MCG/ACT inhaler 2 puffs every 4 hours as needed only  if your can't catch your breath (Patient taking differently: 2 puffs every 4 hours as needed for wheezing and shortness of breath) 1 Inhaler 11  . atenolol (TENORMIN) 25 MG tablet Take 25 mg by mouth every morning.     . budesonide-formoterol (SYMBICORT) 160-4.5 MCG/ACT inhaler Inhale 2 puffs into the lungs 2 (two) times daily. 1 Inhaler 6  . celecoxib (CELEBREX) 200 MG capsule Take 200 mg by mouth 2 (two) times daily.    . fluticasone (FLONASE) 50  MCG/ACT nasal spray Place into both nostrils daily as needed (nasal congestion).    . fluticasone (FLOVENT HFA) 110 MCG/ACT inhaler Inhale into the lungs.    . furosemide (LASIX) 80 MG tablet Take 1 tablet (80 mg total) by mouth daily. Take 80 mg in the morning and 40 mg in the afternoon.    Marland Kitchen KLOR-CON M20 20 MEQ tablet TAKE 2 TABLETS (40 MEQ TOTAL) BY MOUTH 2 (TWO) TIMES DAILY. 360 tablet 2  . losartan (COZAAR) 25 MG tablet Take 1 tablet (25 mg total) by mouth daily. 90 tablet 3  . metFORMIN (GLUCOPHAGE) 500 MG tablet Take 500 mg by mouth daily with breakfast.     . montelukast (SINGULAIR) 10 MG tablet Take 10 mg by mouth at bedtime.    . nitroGLYCERIN (NITROSTAT) 0.4 MG SL tablet Place 1 tablet (0.4 mg total) under the tongue every 5 (five) minutes as needed for chest pain. 25 tablet 6  . omeprazole (PRILOSEC) 20 MG capsule Take 20 mg by mouth daily before breakfast.    . OXYGEN Inhale into the lungs. Use CPAP at bedtime    . rivaroxaban (XARELTO) 20 MG TABS tablet Take 20 mg by mouth.    . sildenafil (VIAGRA) 100 MG tablet Take 100 mg by mouth.    . spironolactone (ALDACTONE) 25 MG tablet Take 25 mg by mouth at bedtime.     . tamsulosin (FLOMAX) 0.4 MG CAPS  Take 0.4 mg by mouth daily.    Marland Kitchen tiZANidine (ZANAFLEX) 4 MG tablet Take 4 mg by mouth 3 (three) times daily as needed.  2  . topiramate (TOPAMAX) 25 MG capsule Take 2 tablets by mouth twice a day    . traMADol (ULTRAM) 50 MG tablet Take 50 mg by mouth.     No current facility-administered medications on file prior to visit.     Allergies  Allergen Reactions  . Morphine Itching  . Adhesive [Tape] Itching and Rash  . Latex Itching and Rash    Bandaids    Past Medical History:  Diagnosis Date  . Anticoagulant long-term use   . Aortic root enlargement ([place])   . Ascending aortic aneurysm [facility])    recent scan in October 2012 showing no change; followed by Dr. Servando Snare  . ASCVD (arteriosclerotic cardiovascular disease)    Prior BMS  to the 2nd OM in September 2012; with repeat cath in October showing patency  . CAD (coronary artery disease)    a. s/p BMS to 2nd OM in Sept 2012; b. LexiScan Myoview (12/2012):  Inf infarct; bowel and motion artifact make study difficult to interpret; no ischemia; not gated; Low Risk  . CHF (congestive heart failure) ([place])    no recent issues 10/13/14  . Colonic polyp   . Contact lens/glasses fitting   . Diabetes mellitus without complication (HCC)    metphormin, average 154  . Diastolic dysfunction   . Generalized headaches    neck stenosis  . GERD (gastroesophageal reflux disease)   . Hearing loss   . Hemorrhoids   . Hypertension   . IBS (irritable bowel syndrome)   . LVH (left ventricular hypertrophy)   . Mild intermittent asthma   . OA (osteoarthritis)   . Obesities, morbid ([place])   . OSA (obstructive sleep apnea)    PSG 03/30/97 AHI 21, BPAP 13/9  . PAF (paroxysmal atrial fibrillation) ([place])    treated with Coumadin  . Prostate CA [facility])    Oncologist  DR. Daralene Mil[facility] 09/24/14, undetermined tx   . Pulmonary embolism ([place])    2008  . Sleep apnea   . SOB (shortness of breath)    on excertion  . Thoracic aortic aneurysm (HCC)    Aortic Size Index=     5.0    /Body surface area is 2.43 meters squared. = 2.05  < 2.75 cm/m2      4% risk per year 2.75 to 4.25          8% risk per year > 4.25 cm/m2    20% risk per year   Stable aneurysmal dilation of the ascending aorta with maximum AP diameter of 4.8 cm. Stable area of narrowing of the proximal most portion of the descending aorta measuring 2 cm., previously identified as an area of coarctation. No evidence of aortic dissection.  Coronary artery disease.  Normal appearance of the lungs.   Electronically Signed   By: Fidela Salisbury M.D.   On: 10/01/2014 08:50      Past Surgical History:  Procedure Laterality Date  . ACHILLES TENDON REPAIR    . APPENDECTOMY    . CARDIAC CATHETERIZATION  2006  . CARDIAC CATHETERIZATION   October 2012   Stent patent  . CERVICAL SPINE SURGERY  06/02/2010   lower back and neck  . CHOLECYSTECTOMY    . COLONOSCOPY WITH PROPOFOL N/A 12/29/2014   Procedure: COLONOSCOPY WITH PROPOFOL;  Surgeon: Garlan Fair, MD;  Location: [facility] ENDOSCOPY;  Service: Endoscopy;  Laterality: N/A;  . CORONARY STENT PLACEMENT  Sept 2012   2nd OM with BMS  . EYE SURGERY     bilateral cataract  . HEMORROIDECTOMY    . LAMINECTOMY  05/30/2012   L 4 L5  . LAPAROSCOPIC GASTRIC BANDING    . LEFT AND RIGHT HEART CATHETERIZATION WITH CORONARY ANGIOGRAM N/A 05/07/2014   Procedure: LEFT AND RIGHT HEART CATHETERIZATION WITH CORONARY ANGIOGRAM;  Surgeon: Howell Groesbeck M Martinique, MD;  Location: [facility] CATH LAB;  Service: Cardiovascular;  Laterality: N/A;  . ROTATOR CUFF REPAIR     both  . UVULOPALATOPHARYNGOPLASTY    . VASECTOMY      History  Smoking Status  . Former Smoker  . Packs/day: 1.50  . Years: 30.00  . Quit date: 01/24/1992  Smokeless Tobacco  . Never Used    Comment: Stopped in 1983    History  Alcohol Use No    Family History  Problem Relation Age of Onset  . Heart disease Mother   . Diabetes Mother   . Other Mother     stent placement  . Emphysema Father 4  . Heart attack Sister     Review of Systems: The review of systems is per the HPI. All other systems were reviewed and are negative.  Physical Exam: BP 138/82   Pulse (!) 56   Ht 5\' 6"  (1.676 m)   Wt 283 lb 3.2 oz (128.5 kg)   BMI 45.71 kg/m    Patient is a pleasant obese white male no acute distress. He is morbidly obese. Skin is warm and dry. Color is normal.  HEENT is unremarkable. Normocephalic/atraumatic. PERRL. Sclera are nonicteric. Neck is supple. No masses. No JVD. Lungs are clear. Cardiac exam shows a regular rate and rhythm. No gallop or murmur. Normal S1-2. Abdomen is obese but soft. Bowel sounds are positive. Extremities are full but with no edema. Gait and ROM are intact. No gross neurologic deficits  noted.   LABORATORY DATA:  Lab Results  Component Value Date   WBC 5.1 05/11/2015   HGB 11.9 (L) 05/11/2015   HCT 35.1 (L) 05/11/2015   PLT 138.0 (L) 05/11/2015   GLUCOSE 129 (H) 10/14/2015   CHOL 118 01/07/2013   TRIG 133 01/07/2013   HDL 40 01/07/2013   LDLCALC 51 01/07/2013   ALT 13 01/06/2013   AST 18 01/06/2013   NA 141 10/14/2015   K 4.0 10/14/2015   CL 107 10/14/2015   CREATININE 1.11 10/14/2015   BUN 21 10/14/2015   CO2 22 10/14/2015   TSH 1.29 05/11/2015   INR 2.21 (H) 04/28/2014   HGBA1C (H) 08/19/2006    6.7 (NOTE)   The ADA recommends the following therapeutic goals for glycemic   control related to Hgb A1C measurement:   Goal of Therapy:   < 7.0% Hgb A1C   Action Suggested:  > 8.0% Hgb A1C   Ref:  Diabetes Care, 22, Suppl. 1, 1999   Lab Results  Component Value Date   CHOL 118 01/07/2013   Labs from 06/03/15: cholesterol 123, LDL 101, HDL 47, LDL 56. BUN 21, creatinine 1.11.   Assessment / Plan:  1. CAD - prior bare-metal stent of the OM in 2012.  Myoview study in Dec 2014 was low risk.  Cath April 2016 showed no obstructive disease. Continue current therapy and risk factor modification.  2. Chronic Diastolic CHF -  Prior cath showed elevated LV filling pressures and mild pulmonary  HTN. Lasix increased.  Continue lasix at current dose. Needs to restrict sodium. Elevate feet this weekend. Encourage increased aerobic activity and weight loss.   3. HTN -  Well controlled.  4. Morbid obesity with OSA on CPAP  5. Thoracic aneurysm -followed by Dr. Servando Snare annually.  CT October showed stable appearance.  6. PAF - now on Xarelto. No sustained episodes. Frequent PACs noted on Ecg but asymptomatic.   7. HLD  8. Asthma.  9. Prostate CA s/p RT

## 2015-12-03 ENCOUNTER — Ambulatory Visit (INDEPENDENT_AMBULATORY_CARE_PROVIDER_SITE_OTHER): Payer: Medicare Other | Admitting: Cardiology

## 2015-12-03 VITALS — BP 138/82 | HR 56 | Ht 66.0 in | Wt 283.2 lb

## 2015-12-03 DIAGNOSIS — I10 Essential (primary) hypertension: Secondary | ICD-10-CM | POA: Diagnosis not present

## 2015-12-03 DIAGNOSIS — I5032 Chronic diastolic (congestive) heart failure: Secondary | ICD-10-CM

## 2015-12-03 DIAGNOSIS — I2510 Atherosclerotic heart disease of native coronary artery without angina pectoris: Secondary | ICD-10-CM | POA: Diagnosis not present

## 2015-12-03 DIAGNOSIS — I712 Thoracic aortic aneurysm, without rupture, unspecified: Secondary | ICD-10-CM

## 2015-12-03 DIAGNOSIS — E7800 Pure hypercholesterolemia, unspecified: Secondary | ICD-10-CM | POA: Diagnosis not present

## 2015-12-03 NOTE — Patient Instructions (Signed)
Continue your current therapy  I will see you in 6 months.   

## 2015-12-07 ENCOUNTER — Encounter (HOSPITAL_COMMUNITY)
Admission: RE | Admit: 2015-12-07 | Discharge: 2015-12-07 | Disposition: A | Discharge status: Home / Self Care | Payer: Medicare Other | Source: Ambulatory Visit | Attending: Pulmonary Disease | Admitting: Pulmonary Disease

## 2015-12-07 VITALS — Wt 276.2 lb

## 2015-12-07 DIAGNOSIS — J4530 Mild persistent asthma, uncomplicated: Secondary | ICD-10-CM | POA: Diagnosis not present

## 2015-12-07 DIAGNOSIS — R5381 Other malaise: Secondary | ICD-10-CM | POA: Diagnosis not present

## 2015-12-07 DIAGNOSIS — Z6841 Body Mass Index (BMI) 40.0 and over, adult: Secondary | ICD-10-CM | POA: Diagnosis not present

## 2015-12-07 DIAGNOSIS — G4733 Obstructive sleep apnea (adult) (pediatric): Secondary | ICD-10-CM | POA: Diagnosis not present

## 2015-12-07 DIAGNOSIS — J9611 Chronic respiratory failure with hypoxia: Secondary | ICD-10-CM | POA: Diagnosis not present

## 2015-12-07 DIAGNOSIS — R0609 Other forms of dyspnea: Secondary | ICD-10-CM | POA: Diagnosis not present

## 2015-12-07 NOTE — Progress Notes (Signed)
Daily Session Note  Patient Details  Name: Carlos Dawson MRN: [id number] Date of Birth: 1941-01-01 Referring Provider:   [name] Pulmonary Rehab Walk Test from 09/14/2015 in [place]  Referring Provider  Dr. Halford Chessman      Encounter Date: 12/07/2015  Check In:     Session Check In - 12/07/15 1028      Check-In   Location [facility]   Staff Present Rosebud Poles, RN, BSN;Molly diVincenzo, MS, ACSM RCEP, Exercise Physiologist;Deundra Bard Ysidro Evert, RN   Supervising physician immediately available to respond to emergencies [facility] immediately available   Physician(s) Dr. Tana Coast   Medication changes reported     No   Fall or balance concerns reported    No   Warm-up and Cool-down Performed as group-led instruction   Resistance Training Performed Yes   VAD Patient? No     Pain Assessment   Currently in Pain? No/denies   Multiple Pain Sites No      Capillary Blood Glucose: No results found for this or any previous visit (from the past 24 hour(s)).     POCT Glucose - 12/07/15 1211      POCT Blood Glucose   Pre-Exercise 182 mg/dL   Post-Exercise 130 mg/dL         Exercise Prescription Changes - 12/07/15 1200      Response to Exercise   Blood Pressure (Admit) 100/60   Blood Pressure (Exercise) 120/74   Blood Pressure (Exit) 102/60   Heart Rate (Admit) 53 bpm   Heart Rate (Exercise) 72 bpm   Heart Rate (Exit) 57 bpm   Oxygen Saturation (Admit) 99 %   Oxygen Saturation (Exercise) 93 %   Oxygen Saturation (Exit) 94 %   Rating of Perceived Exertion (Exercise) 12   Perceived Dyspnea (Exercise) 1   Duration Progress to 45 minutes of aerobic exercise without signs/symptoms of physical distress   Intensity THRR unchanged     Progression   Progression Continue to progress workloads to maintain intensity without signs/symptoms of physical distress.     Resistance Training   Training Prescription Yes   Weight blue  bands   Reps 10-12  10 minutes of strength training     Interval Training   Interval Training No     Oxygen   Oxygen Continuous   Liters 2     NuStep   Level 5   Minutes 17   METs 2.6     Track   Laps 27   Minutes 34     Goals Met:  Exercise tolerated well No report of cardiac concerns or symptoms Strength training completed today  Goals Unmet:  Not Applicable  Comments: Service time is from 1030 to 1205    Dr. Rush Farmer is Medical Director for Pulmonary Rehab at [facility].

## 2015-12-08 DIAGNOSIS — I10 Essential (primary) hypertension: Secondary | ICD-10-CM | POA: Diagnosis not present

## 2015-12-08 DIAGNOSIS — Z6841 Body Mass Index (BMI) 40.0 and over, adult: Secondary | ICD-10-CM | POA: Diagnosis not present

## 2015-12-08 DIAGNOSIS — E119 Type 2 diabetes mellitus without complications: Secondary | ICD-10-CM | POA: Diagnosis not present

## 2015-12-08 DIAGNOSIS — Z7984 Long term (current) use of oral hypoglycemic drugs: Secondary | ICD-10-CM | POA: Diagnosis not present

## 2015-12-08 DIAGNOSIS — K521 Toxic gastroenteritis and colitis: Secondary | ICD-10-CM | POA: Diagnosis not present

## 2015-12-08 DIAGNOSIS — I509 Heart failure, unspecified: Secondary | ICD-10-CM | POA: Diagnosis not present

## 2015-12-09 ENCOUNTER — Encounter (HOSPITAL_COMMUNITY)
Admission: RE | Admit: 2015-12-09 | Discharge: 2015-12-09 | Disposition: A | Discharge status: Home / Self Care | Payer: Medicare Other | Source: Ambulatory Visit | Attending: Pulmonary Disease | Admitting: Pulmonary Disease

## 2015-12-09 DIAGNOSIS — J4530 Mild persistent asthma, uncomplicated: Secondary | ICD-10-CM | POA: Diagnosis not present

## 2015-12-09 DIAGNOSIS — R5381 Other malaise: Secondary | ICD-10-CM | POA: Diagnosis not present

## 2015-12-09 DIAGNOSIS — R0609 Other forms of dyspnea: Secondary | ICD-10-CM | POA: Diagnosis not present

## 2015-12-09 DIAGNOSIS — G4733 Obstructive sleep apnea (adult) (pediatric): Secondary | ICD-10-CM | POA: Diagnosis not present

## 2015-12-09 DIAGNOSIS — J9611 Chronic respiratory failure with hypoxia: Secondary | ICD-10-CM

## 2015-12-09 DIAGNOSIS — Z6841 Body Mass Index (BMI) 40.0 and over, adult: Secondary | ICD-10-CM | POA: Diagnosis not present

## 2015-12-09 NOTE — Progress Notes (Signed)
Daily Session Note  Patient Details  Name: Carlos Dawson MRN: [id number] Date of Birth: 02/28/1940 Referring Provider:   [name] Pulmonary Rehab Walk Test from 09/14/2015 in [place]  Referring Provider  Dr. Halford Chessman      Encounter Date: 12/09/2015  Check In:     Session Check In - 12/09/15 1024      Check-In   Location [facility]   Staff Present Rosebud Poles, RN, BSN;Leota Maka, MS, ACSM RCEP, Exercise Physiologist;Lisa Ysidro Evert, RN;Portia Rollene Rotunda, RN, BSN   Supervising physician immediately available to respond to emergencies [facility] immediately available   Physician(s) Dr. Grandville Silos   Medication changes reported     No   Fall or balance concerns reported    No   Warm-up and Cool-down Performed as group-led instruction   Resistance Training Performed Yes   VAD Patient? No     Pain Assessment   Currently in Pain? No/denies   Multiple Pain Sites No      Capillary Blood Glucose: No results found for this or any previous visit (from the past 24 hour(s)).     POCT Glucose - 12/09/15 1235      POCT Blood Glucose   Pre-Exercise 200 mg/dL   Post-Exercise 138 mg/dL         Exercise Prescription Changes - 12/09/15 1200      Response to Exercise   Blood Pressure (Admit) 106/58   Blood Pressure (Exercise) 124/60   Blood Pressure (Exit) 104/54   Heart Rate (Admit) 52 bpm   Heart Rate (Exercise) 81 bpm   Heart Rate (Exit) 66 bpm   Oxygen Saturation (Admit) 97 %   Oxygen Saturation (Exercise) 92 %   Oxygen Saturation (Exit) 92 %   Rating of Perceived Exertion (Exercise) 11   Perceived Dyspnea (Exercise) 1   Duration Progress to 45 minutes of aerobic exercise without signs/symptoms of physical distress   Intensity THRR unchanged     Progression   Progression Continue to progress workloads to maintain intensity without signs/symptoms of physical distress.     Resistance Training   Training  Prescription Yes   Weight blue bands   Reps 10-12  10 minutes of strength training     Interval Training   Interval Training No     Oxygen   Oxygen Continuous   Liters 2     NuStep   Level 5   Minutes 17   METs 2.7     Track   Laps 13   Minutes 17     Goals Met:  Exercise tolerated well No report of cardiac concerns or symptoms Strength training completed today  Goals Unmet:  Not Applicable  Comments: Service time is from 10:30am to 12:20pm    Dr. Rush Farmer is Medical Director for Pulmonary Rehab at [facility].

## 2015-12-14 ENCOUNTER — Encounter (HOSPITAL_COMMUNITY)
Admission: RE | Admit: 2015-12-14 | Discharge: 2015-12-14 | Disposition: A | Discharge status: Home / Self Care | Payer: Medicare Other | Source: Ambulatory Visit | Attending: Pulmonary Disease | Admitting: Pulmonary Disease

## 2015-12-14 VITALS — Wt 276.9 lb

## 2015-12-14 DIAGNOSIS — G4733 Obstructive sleep apnea (adult) (pediatric): Secondary | ICD-10-CM | POA: Diagnosis not present

## 2015-12-14 DIAGNOSIS — J9611 Chronic respiratory failure with hypoxia: Secondary | ICD-10-CM

## 2015-12-14 DIAGNOSIS — Z6841 Body Mass Index (BMI) 40.0 and over, adult: Secondary | ICD-10-CM | POA: Diagnosis not present

## 2015-12-14 DIAGNOSIS — R5381 Other malaise: Secondary | ICD-10-CM | POA: Diagnosis not present

## 2015-12-14 DIAGNOSIS — R0609 Other forms of dyspnea: Secondary | ICD-10-CM | POA: Diagnosis not present

## 2015-12-14 DIAGNOSIS — J4530 Mild persistent asthma, uncomplicated: Secondary | ICD-10-CM | POA: Diagnosis not present

## 2015-12-14 NOTE — Progress Notes (Signed)
Daily Session Note  Patient Details  Name: Carlos Dawson MRN: [id number] Date of Birth: [birth year] Referring Provider:   [name] Pulmonary Rehab Walk Test from 09/14/2015 in [place]  Referring Provider  Dr. Halford Chessman      Encounter Date: 12/14/2015  Check In:     Session Check In - 12/14/15 1030      Check-In   Location [facility]   Staff Present Rosebud Poles, RN, BSN;Molly diVincenzo, MS, ACSM RCEP, Exercise Physiologist;Lisa Ysidro Evert, RN;Blakleigh Straw Rollene Rotunda, RN, BSN   Supervising physician immediately available to respond to emergencies [facility] immediately available   Physician(s) Dr. Grandville Silos   Medication changes reported     No   Fall or balance concerns reported    No   Warm-up and Cool-down Performed as group-led instruction   Resistance Training Performed Yes   VAD Patient? No     Pain Assessment   Currently in Pain? No/denies   Multiple Pain Sites No      Capillary Blood Glucose: No results found for this or any previous visit (from the past 24 hour(s)).      Exercise Prescription Changes - 12/14/15 1203      Exercise Review   Progression Yes     Response to Exercise   Blood Pressure (Admit) 106/60   Blood Pressure (Exercise) 96/60   Blood Pressure (Exit) 100/60   Heart Rate (Admit) 54 bpm   Heart Rate (Exercise) 76 bpm   Heart Rate (Exit) 58 bpm   Oxygen Saturation (Admit) 98 %   Oxygen Saturation (Exercise) 91 %   Oxygen Saturation (Exit) 94 %   Rating of Perceived Exertion (Exercise) 11   Perceived Dyspnea (Exercise) 0   Duration Progress to 45 minutes of aerobic exercise without signs/symptoms of physical distress   Intensity THRR unchanged     Progression   Progression Continue to progress workloads to maintain intensity without signs/symptoms of physical distress.     Resistance Training   Training Prescription Yes   Weight blue bands   Reps 10-12  10 minutes of strength training      Interval Training   Interval Training No     Oxygen   Oxygen Continuous   Liters 2     NuStep   Level 6   Minutes 34   METs 2.5     Track   Laps 11   Minutes 17     Goals Met:  Improved SOB with ADL's Using PLB without cueing & demonstrates good technique Exercise tolerated well No report of cardiac concerns or symptoms Strength training completed today  Goals Unmet:  Not Applicable  Comments: Service time is from 1030 to 1200   Dr. Rush Farmer is Medical Director for Pulmonary Rehab at [facility].

## 2015-12-16 ENCOUNTER — Encounter (HOSPITAL_COMMUNITY): Payer: Medicare Other

## 2015-12-21 ENCOUNTER — Encounter (HOSPITAL_COMMUNITY)
Admission: RE | Admit: 2015-12-21 | Discharge: 2015-12-21 | Disposition: A | Discharge status: Home / Self Care | Payer: Medicare Other | Source: Ambulatory Visit | Attending: Pulmonary Disease | Admitting: Pulmonary Disease

## 2015-12-21 VITALS — Wt 278.4 lb

## 2015-12-21 DIAGNOSIS — R5381 Other malaise: Secondary | ICD-10-CM | POA: Diagnosis not present

## 2015-12-21 DIAGNOSIS — G4733 Obstructive sleep apnea (adult) (pediatric): Secondary | ICD-10-CM | POA: Diagnosis not present

## 2015-12-21 DIAGNOSIS — J4530 Mild persistent asthma, uncomplicated: Secondary | ICD-10-CM | POA: Diagnosis not present

## 2015-12-21 DIAGNOSIS — R0609 Other forms of dyspnea: Secondary | ICD-10-CM | POA: Diagnosis not present

## 2015-12-21 DIAGNOSIS — J9611 Chronic respiratory failure with hypoxia: Secondary | ICD-10-CM

## 2015-12-21 DIAGNOSIS — Z6841 Body Mass Index (BMI) 40.0 and over, adult: Secondary | ICD-10-CM | POA: Diagnosis not present

## 2015-12-21 NOTE — Progress Notes (Signed)
Daily Session Note  Patient Details  Name: Carlos Dawson MRN: [id number] Date of Birth: [birth year] Referring Provider:   [name] Pulmonary Rehab Walk Test from 09/14/2015 in [place]  Referring Provider  Dr. Halford Chessman      Encounter Date: 12/21/2015  Check In:     Session Check In - 12/21/15 1023      Check-In   Location [facility]   Staff Present Rosebud Poles, RN, BSN;Neville Pauls, MS, ACSM RCEP, Exercise Physiologist;Lisa Ysidro Evert, RN;Portia Rollene Rotunda, RN, BSN   Supervising physician immediately available to respond to emergencies [facility] immediately available   Physician(s) Dr. Cathlean Sauer   Medication changes reported     No   Fall or balance concerns reported    No   Warm-up and Cool-down Performed as group-led instruction   Resistance Training Performed Yes   VAD Patient? No     Pain Assessment   Currently in Pain? No/denies   Multiple Pain Sites No      Capillary Blood Glucose: No results found for this or any previous visit (from the past 24 hour(s)).      Exercise Prescription Changes - 12/21/15 1200      Response to Exercise   Blood Pressure (Admit) 124/58   Blood Pressure (Exercise) 122/70   Blood Pressure (Exit) 100/60   Heart Rate (Admit) 76 bpm   Heart Rate (Exercise) 107 bpm   Heart Rate (Exit) 69 bpm   Oxygen Saturation (Admit) 99 %   Oxygen Saturation (Exercise) 93 %   Oxygen Saturation (Exit) 92 %   Rating of Perceived Exertion (Exercise) 13   Perceived Dyspnea (Exercise) 3   Duration Progress to 45 minutes of aerobic exercise without signs/symptoms of physical distress   Intensity THRR unchanged     Progression   Progression Continue to progress workloads to maintain intensity without signs/symptoms of physical distress.     Resistance Training   Training Prescription Yes   Weight blue bands   Reps 10-12  10 minutes of strength training     Interval Training   Interval  Training No     Oxygen   Oxygen Continuous   Liters 2     NuStep   Level 6   Minutes 17   METs 2.5     Track   Laps 24   Minutes 34     Goals Met:  Exercise tolerated well No report of cardiac concerns or symptoms Strength training completed today  Goals Unmet:  Not Applicable  Comments: Service time is from 10:30am to 12:00pm    Dr. Rush Farmer is Medical Director for Pulmonary Rehab at [facility].

## 2015-12-23 ENCOUNTER — Encounter (HOSPITAL_COMMUNITY)
Admission: RE | Admit: 2015-12-23 | Discharge: 2015-12-23 | Disposition: A | Discharge status: Home / Self Care | Payer: Medicare Other | Source: Ambulatory Visit | Attending: Pulmonary Disease | Admitting: Pulmonary Disease

## 2015-12-23 VITALS — Wt 280.9 lb

## 2015-12-23 DIAGNOSIS — Z6841 Body Mass Index (BMI) 40.0 and over, adult: Secondary | ICD-10-CM | POA: Diagnosis not present

## 2015-12-23 DIAGNOSIS — J9611 Chronic respiratory failure with hypoxia: Secondary | ICD-10-CM

## 2015-12-23 DIAGNOSIS — R5381 Other malaise: Secondary | ICD-10-CM | POA: Diagnosis not present

## 2015-12-23 DIAGNOSIS — G4733 Obstructive sleep apnea (adult) (pediatric): Secondary | ICD-10-CM | POA: Diagnosis not present

## 2015-12-23 DIAGNOSIS — R0609 Other forms of dyspnea: Secondary | ICD-10-CM | POA: Diagnosis not present

## 2015-12-23 DIAGNOSIS — J4530 Mild persistent asthma, uncomplicated: Secondary | ICD-10-CM | POA: Diagnosis not present

## 2015-12-23 NOTE — Progress Notes (Signed)
Daily Session Note  Patient Details  Name: Carlos Dawson MRN: [id number] Date of Birth: 06-Feb-1940 Referring Provider:   [name] Pulmonary Rehab Walk Test from 09/14/2015 in [place]  Referring Provider  Dr. Halford Chessman      Encounter Date: 12/23/2015  Check In:     Session Check In - 12/23/15 1022      Check-In   Location [facility]   Staff Present Rosebud Poles, RN, BSN;Lisa Ysidro Evert, RN;Steffany Schoenfelder, MS, ACSM RCEP, Exercise Physiologist;Portia Rollene Rotunda, RN, BSN   Supervising physician immediately available to respond to emergencies [facility] immediately available   Physician(s) Dr. Ree Kida   Medication changes reported     No   Fall or balance concerns reported    No   Warm-up and Cool-down Performed as group-led instruction   Resistance Training Performed Yes   VAD Patient? No     Pain Assessment   Currently in Pain? No/denies   Multiple Pain Sites No      Capillary Blood Glucose: No results found for this or any previous visit (from the past 24 hour(s)).      Exercise Prescription Changes - 12/23/15 1200      Response to Exercise   Blood Pressure (Admit) 114/70   Blood Pressure (Exercise) 114/60   Blood Pressure (Exit) 102/60   Heart Rate (Admit) 60 bpm   Heart Rate (Exercise) 83 bpm   Heart Rate (Exit) 63 bpm   Oxygen Saturation (Admit) 99 %   Oxygen Saturation (Exercise) 94 %   Oxygen Saturation (Exit) 94 %   Rating of Perceived Exertion (Exercise) 13   Perceived Dyspnea (Exercise) 3   Duration Progress to 45 minutes of aerobic exercise without signs/symptoms of physical distress   Intensity THRR unchanged     Progression   Progression Continue to progress workloads to maintain intensity without signs/symptoms of physical distress.     Resistance Training   Training Prescription Yes   Weight blue bands   Reps 10-12  10 minutes of strength training     Interval Training   Interval  Training No     Oxygen   Oxygen Continuous   Liters 2     NuStep   Level 6   Minutes 17   METs 2.5     Track   Laps 15   Minutes 17     Goals Met:  Exercise tolerated well No report of cardiac concerns or symptoms Strength training completed today  Goals Unmet:  Not Applicable  Comments: Service time is from 10:30am to 12:15pm    Dr. Rush Farmer is Medical Director for Pulmonary Rehab at [facility].

## 2015-12-28 ENCOUNTER — Telehealth: Payer: Self-pay

## 2015-12-28 ENCOUNTER — Encounter (HOSPITAL_COMMUNITY)
Admission: RE | Admit: 2015-12-28 | Discharge: 2015-12-28 | Disposition: A | Discharge status: Home / Self Care | Payer: Medicare Other | Source: Ambulatory Visit | Attending: Pulmonary Disease | Admitting: Pulmonary Disease

## 2015-12-28 VITALS — Wt 282.6 lb

## 2015-12-28 DIAGNOSIS — R0609 Other forms of dyspnea: Secondary | ICD-10-CM | POA: Insufficient documentation

## 2015-12-28 DIAGNOSIS — J4530 Mild persistent asthma, uncomplicated: Secondary | ICD-10-CM | POA: Diagnosis not present

## 2015-12-28 DIAGNOSIS — Z6841 Body Mass Index (BMI) 40.0 and over, adult: Secondary | ICD-10-CM | POA: Diagnosis not present

## 2015-12-28 DIAGNOSIS — R5381 Other malaise: Secondary | ICD-10-CM | POA: Insufficient documentation

## 2015-12-28 DIAGNOSIS — G4733 Obstructive sleep apnea (adult) (pediatric): Secondary | ICD-10-CM | POA: Insufficient documentation

## 2015-12-28 DIAGNOSIS — J9611 Chronic respiratory failure with hypoxia: Secondary | ICD-10-CM | POA: Insufficient documentation

## 2015-12-28 NOTE — Progress Notes (Signed)
Daily Session Note  Patient Details  Name: Carlos Dawson MRN: [id number] Date of Birth: Jun 05, 1940 Referring Provider:   [name] Pulmonary Rehab Walk Test from 09/14/2015 in [place]  Referring Provider  Dr. Halford Chessman      Encounter Date: 12/28/2015  Check In:     Session Check In - 12/28/15 1008      Check-In   Location [facility]   Staff Present Rosebud Poles, RN, BSN;Lisa Ysidro Evert, RN;Taygen Acklin, MS, ACSM RCEP, Exercise Physiologist;Portia Rollene Rotunda, RN, BSN   Supervising physician immediately available to respond to emergencies [facility] immediately available   Physician(s) Dr. Ree Kida   Medication changes reported     No   Fall or balance concerns reported    No   Warm-up and Cool-down Performed as group-led instruction   Resistance Training Performed Yes   VAD Patient? No     Pain Assessment   Currently in Pain? No/denies   Multiple Pain Sites No      Capillary Blood Glucose: No results found for this or any previous visit (from the past 24 hour(s)).      Exercise Prescription Changes - 12/28/15 1200      Exercise Review   Progression Yes     Response to Exercise   Blood Pressure (Admit) 104/60   Blood Pressure (Exercise) 132/70   Blood Pressure (Exit) 100/50   Heart Rate (Admit) 52 bpm   Heart Rate (Exercise) 81 bpm   Heart Rate (Exit) 65 bpm   Oxygen Saturation (Admit) 96 %   Oxygen Saturation (Exercise) 93 %   Oxygen Saturation (Exit) 94 %   Rating of Perceived Exertion (Exercise) 13   Perceived Dyspnea (Exercise) 2   Duration Progress to 45 minutes of aerobic exercise without signs/symptoms of physical distress   Intensity THRR unchanged     Progression   Progression Continue to progress workloads to maintain intensity without signs/symptoms of physical distress.     Resistance Training   Training Prescription Yes   Weight blue bands   Reps 10-12  10 minutes of strength training     Interval Training   Interval Training No     Oxygen   Oxygen Continuous   Liters 2     NuStep   Level 7   Minutes 17   METs 2.6     Track   Laps 30   Minutes 34     Goals Met:  Exercise tolerated well No report of cardiac concerns or symptoms Strength training completed today  Goals Unmet:  Not Applicable  Comments: Service time is from 10:30am to 12:05pm    Dr. Rush Farmer is Medical Director for Pulmonary Rehab at [facility]

## 2015-12-28 NOTE — Progress Notes (Signed)
Pulmonary Individual Treatment Plan  Patient Details  Name: Carlos Dawson MRN: [id number] Date of Birth: 10-16-1940 Referring Provider:   [name] Pulmonary Rehab Walk Test from 09/14/2015 in [place]  Referring Provider  Dr. Halford Chessman      Initial Encounter Date:  Flowsheet Row Pulmonary Rehab Walk Test from 09/14/2015 in [place]  Date  09/14/15  Referring Provider  Dr. Halford Chessman      Visit Diagnosis: Chronic respiratory failure with hypoxia ([place])  Patient's Home Medications on Admission:   Current Outpatient Prescriptions:  .  acetaminophen (TYLENOL) 500 MG tablet, Take 500 mg by mouth as needed., Disp: , Rfl:  .  albuterol (PROAIR HFA) 108 (90 Base) MCG/ACT inhaler, 2 puffs every 4 hours as needed only  if your can't catch your breath (Patient taking differently: 2 puffs every 4 hours as needed for wheezing and shortness of breath), Disp: 1 Inhaler, Rfl: 11 .  atenolol (TENORMIN) 25 MG tablet, Take 25 mg by mouth every morning. , Disp: , Rfl:  .  atorvastatin (LIPITOR) 10 MG tablet, Take 10 mg by mouth daily., Disp: , Rfl:  .  budesonide-formoterol (SYMBICORT) 160-4.5 MCG/ACT inhaler, Inhale 2 puffs into the lungs 2 (two) times daily., Disp: 1 Inhaler, Rfl: 6 .  celecoxib (CELEBREX) 200 MG capsule, Take 200 mg by mouth 2 (two) times daily., Disp: , Rfl:  .  fluticasone (FLONASE) 50 MCG/ACT nasal spray, Place into both nostrils daily as needed (nasal congestion)., Disp: , Rfl:  .  fluticasone (FLOVENT HFA) 110 MCG/ACT inhaler, Inhale into the lungs., Disp: , Rfl:  .  furosemide (LASIX) 80 MG tablet, Take 1 tablet (80 mg total) by mouth daily. Take 80 mg in the morning and 40 mg in the afternoon., Disp: , Rfl:  .  KLOR-CON M20 20 MEQ tablet, TAKE 2 TABLETS (40 MEQ TOTAL) BY MOUTH 2 (TWO) TIMES DAILY., Disp: 360 tablet, Rfl: 2 .  losartan (COZAAR) 25 MG tablet, Take 1 tablet (25 mg total) by mouth daily., Disp: 90  tablet, Rfl: 3 .  metFORMIN (GLUCOPHAGE) 500 MG tablet, Take 500 mg by mouth daily with breakfast. , Disp: , Rfl:  .  montelukast (SINGULAIR) 10 MG tablet, Take 10 mg by mouth at bedtime., Disp: , Rfl:  .  nitroGLYCERIN (NITROSTAT) 0.4 MG SL tablet, Place 1 tablet (0.4 mg total) under the tongue every 5 (five) minutes as needed for chest pain., Disp: 25 tablet, Rfl: 6 .  omeprazole (PRILOSEC) 20 MG capsule, Take 20 mg by mouth daily before breakfast., Disp: , Rfl:  .  OXYGEN, Inhale into the lungs. Use CPAP at bedtime, Disp: , Rfl:  .  rivaroxaban (XARELTO) 20 MG TABS tablet, Take 20 mg by mouth., Disp: , Rfl:  .  sildenafil (VIAGRA) 100 MG tablet, Take 100 mg by mouth., Disp: , Rfl:  .  spironolactone (ALDACTONE) 25 MG tablet, Take 25 mg by mouth at bedtime. , Disp: , Rfl:  .  tamsulosin (FLOMAX) 0.4 MG CAPS, Take 0.4 mg by mouth daily., Disp: , Rfl:  .  tiZANidine (ZANAFLEX) 4 MG tablet, Take 4 mg by mouth 3 (three) times daily as needed., Disp: , Rfl: 2 .  topiramate (TOPAMAX) 25 MG capsule, Take 2 tablets by mouth twice a day, Disp: , Rfl:  .  traMADol (ULTRAM) 50 MG tablet, Take 50 mg by mouth., Disp: , Rfl:   Past Medical History: Past Medical History:  Diagnosis Date  .  Anticoagulant long-term use   . Aortic root enlargement ([place])   . Ascending aortic aneurysm [facility])    recent scan in October 2012 showing no change; followed by Dr. Servando Snare  . ASCVD (arteriosclerotic cardiovascular disease)    Prior BMS to the 2nd OM in September 2012; with repeat cath in October showing patency  . CAD (coronary artery disease)    a. s/p BMS to 2nd OM in Sept 2012; b. LexiScan Myoview (12/2012):  Inf infarct; bowel and motion artifact make study difficult to interpret; no ischemia; not gated; Low Risk  . CHF (congestive heart failure) ([place])    no recent issues 10/13/14  . Colonic polyp   . Contact lens/glasses fitting   . Diabetes mellitus without complication (HCC)    metphormin, average 154  .  Diastolic dysfunction   . Generalized headaches    neck stenosis  . GERD (gastroesophageal reflux disease)   . Hearing loss   . Hemorrhoids   . Hypertension   . IBS (irritable bowel syndrome)   . LVH (left ventricular hypertrophy)   . Mild intermittent asthma   . OA (osteoarthritis)   . Obesities, morbid ([place])   . OSA (obstructive sleep apnea)    PSG 03/30/97 AHI 21, BPAP 13/9  . PAF (paroxysmal atrial fibrillation) ([place])    treated with Coumadin  . Prostate CA [facility])    Oncologist  DR. Daralene Mil[facility] 09/24/14, undetermined tx   . Pulmonary embolism ([place])    2008  . Sleep apnea   . SOB (shortness of breath)    on excertion  . Thoracic aortic aneurysm (HCC)    Aortic Size Index=     5.0    /Body surface area is 2.43 meters squared. = 2.05  < 2.75 cm/m2      4% risk per year 2.75 to 4.25          8% risk per year > 4.25 cm/m2    20% risk per year   Stable aneurysmal dilation of the ascending aorta with maximum AP diameter of 4.8 cm. Stable area of narrowing of the proximal most portion of the descending aorta measuring 2 cm., previously identified as an area of coarctation. No evidence of aortic dissection.  Coronary artery disease.  Normal appearance of the lungs.   Electronically Signed   By: Fidela Salisbury M.D.   On: 10/01/2014 08:50      Tobacco Use: History  Smoking Status  . Former Smoker  . Packs/day: 1.50  . Years: 30.00  . Quit date: 01/24/1992  Smokeless Tobacco  . Never Used    Comment: Stopped in 1983    Labs: Recent Review Flowsheet Data    Labs for ITP Cardiac and Pulmonary Rehab Latest Ref Rng & Units 02/26/2010 11/20/2011 01/07/2013 05/07/2014 05/07/2014   Cholestrol 0 - 200 mg/dL - 124 118 - -   LDLCALC 0 - 99 mg/dL - 62 51 - -   HDL >39 mg/dL - 41.10 40 - -   Trlycerides <150 mg/dL - 104.0 133 - -   PHART 7.350 - 7.450 - - - - 7.336(L)   PCO2ART 35.0 - 45.0 mmHg - - - - 42.3   HCO3 20.0 - 24.0 mEq/L - - - 23.0 22.6   TCO2 0 - 100 mmol/L 25 - - 24 24    ACIDBASEDEF 0.0 - 2.0 mmol/L - - - 3.0(H) 3.0(H)   O2SAT % - - - 61.0 91.0      Capillary Blood  Glucose: Lab Results  Component Value Date   GLUCAP 116 (H) 12/29/2014   GLUCAP 113 (H) 05/07/2014   GLUCAP 135 (H) 05/07/2014   GLUCAP 95 01/28/2013   GLUCAP 97 06/01/2012       POCT Glucose    Row Name 09/21/15 1212 09/23/15 1211 09/28/15 1227 09/30/15 1227 10/07/15 1210     POCT Blood Glucose   Pre-Exercise 176 mg/dL 218 mg/dL 172 mg/dL 212 mg/dL 231 mg/dL   Post-Exercise 137 mg/dL 159 mg/dL 144 mg/dL 148 mg/dL 208 mg/dL   Row Name 10/19/15 1226 10/26/15 1222 10/28/15 1234 11/11/15 1210 11/18/15 1259     POCT Blood Glucose   Pre-Exercise 159 mg/dL 249 mg/dL 244 mg/dL 170 mg/dL 191 mg/dL   Post-Exercise 158 mg/dL 230 mg/dL 136 mg/dL 131 mg/dL 129 mg/dL   Row Name 12/07/15 1211 12/09/15 1235 12/21/15 1211 12/23/15 1223       POCT Blood Glucose   Pre-Exercise 182 mg/dL 200 mg/dL 199 mg/dL 228 mg/dL    Post-Exercise 130 mg/dL 138 mg/dL 132 mg/dL 129 mg/dL       ADL UCSD:   Pulmonary Function Assessment:     Pulmonary Function Assessment - 09/13/15 1106      Breath   Bilateral Breath Sounds Clear      Exercise Target Goals:    Exercise Program Goal: Individual exercise prescription set with THRR, safety & activity barriers. Participant demonstrates ability to understand and report RPE using BORG scale, to self-measure pulse accurately, and to acknowledge the importance of the exercise prescription.  Exercise Prescription Goal: Starting with aerobic activity 30 plus minutes a day, 3 days per week for initial exercise prescription. Provide home exercise prescription and guidelines that participant acknowledges understanding prior to discharge.  Activity Barriers & Risk Stratification:     Activity Barriers & Cardiac Risk Stratification - 09/13/15 1104      Activity Barriers & Cardiac Risk Stratification   Activity Barriers Deconditioning;Shortness of Breath;Back  Problems;Neck/Spine Problems      6 Minute Walk:     6 Minute Walk    Row Name 09/14/15 1626         6 Minute Walk   Phase Initial     Distance 1210 feet     Walk Time 6 minutes     # of Rest Breaks 0     MPH 2.29     METS 2.68     RPE 13     Perceived Dyspnea  2     Symptoms Yes (comment)     Comments leg fatigue     Resting HR 75 bpm     Resting BP 120/60     Max Ex. HR 104 bpm     Max Ex. BP 148/62     2 Minute Post BP 126/60       Interval HR   Baseline HR 75     1 Minute HR 85     2 Minute HR 92     3 Minute HR 93     4 Minute HR 97     5 Minute HR 104     6 Minute HR 94     2 Minute Post HR 85     Interval Heart Rate? Yes       Interval Oxygen   Interval Oxygen? Yes     Baseline Oxygen Saturation % 96 %     Baseline Liters of Oxygen 2 L     1 Minute Oxygen Saturation %  95 %     1 Minute Liters of Oxygen 2 L     2 Minute Oxygen Saturation % 93 %     2 Minute Liters of Oxygen 2 L     3 Minute Oxygen Saturation % 91 %     3 Minute Liters of Oxygen 2 L     4 Minute Oxygen Saturation % 91 %     4 Minute Liters of Oxygen 2 L     5 Minute Oxygen Saturation % 90 %     5 Minute Liters of Oxygen 2 L     6 Minute Oxygen Saturation % 90 %     6 Minute Liters of Oxygen 2 L     2 Minute Post Oxygen Saturation % 93 %     2 Minute Post Liters of Oxygen 2 L        Initial Exercise Prescription:     Initial Exercise Prescription - 09/14/15 1600      Date of Initial Exercise RX and Referring Provider   Date 09/14/15   Referring Provider Dr. Halford Chessman     Oxygen   Oxygen Continuous   Liters 2     NuStep   Level 1   Minutes 17   METs 1.5     Arm Ergometer   Level 1   Minutes 17     Track   Laps 5   Minutes 17     Prescription Details   Frequency (times per week) 2   Duration Progress to 45 minutes of aerobic exercise without signs/symptoms of physical distress     Intensity   THRR 40-80% of Max Heartrate 59-118   Ratings of Perceived Exertion  11-13   Perceived Dyspnea 0-4     Progression   Progression Continue progressive overload as per policy without signs/symptoms or physical distress.     Resistance Training   Training Prescription Yes   Weight blue bands   Reps 10-12      Perform Capillary Blood Glucose checks as needed.  Exercise Prescription Changes:     Exercise Prescription Changes    Row Name 09/21/15 1200 09/23/15 1200 09/28/15 1200 09/30/15 1224 10/05/15 1200     Exercise Review   Progression  - Yes  -  -  -     Response to Exercise   Blood Pressure (Admit) 102/66 110/52 104/56 100/60 110/60   Blood Pressure (Exercise) 132/100 112/54 120/62 110/60  -   Blood Pressure (Exit) 110/60 104/50 100/58 98/50 98/64   Heart Rate (Admit) 62 bpm 59 bpm 58 bpm 60 bpm 68 bpm   Heart Rate (Exercise) 89 bpm 79 bpm 88 bpm 78 bpm  -   Heart Rate (Exit) 70 bpm 65 bpm 66 bpm 62 bpm 59 bpm   Oxygen Saturation (Admit) 93 % 97 % 95 % 96 % 97 %   Oxygen Saturation (Exercise) 88 % 93 % 91 % 91 %  -   Oxygen Saturation (Exit) 95 % 93 % 92 % 95 % 95 %   Rating of Perceived Exertion (Exercise) 12  -  - 13 13   Perceived Dyspnea (Exercise) 1  -  - 1 3   Duration Progress to 45 minutes of aerobic exercise without signs/symptoms of physical distress Progress to 45 minutes of aerobic exercise without signs/symptoms of physical distress Progress to 45 minutes of aerobic exercise without signs/symptoms of physical distress Progress to 45 minutes of aerobic exercise without signs/symptoms of physical  distress  -   Intensity Other (comment)  HRR 40-80% THRR unchanged THRR unchanged THRR unchanged  -     Progression   Progression Continue to progress workloads to maintain intensity without signs/symptoms of physical distress. Continue to progress workloads to maintain intensity without signs/symptoms of physical distress. Continue to progress workloads to maintain intensity without signs/symptoms of physical distress. Continue to progress  workloads to maintain intensity without signs/symptoms of physical distress.  -     Resistance Training   Training Prescription Yes Yes Yes Yes  -   Weight blue bands blue bands blue bands blue bands  -   Reps 10-12 10-12  10 minutes of strength training 10-12  10 minutes of strength training 10-12  10 minutes of strength training  -     Interval Training   Interval Training  -  - No No  -     Oxygen   Oxygen _0    Liters _1 NuStep   Level 2  - 2 3  -   Minutes 17  - 17 17  -   METs 1.7  - 1.8 2  -     Arm Ergometer   Level _2 -  -   Minutes _3 -  -     Track   Laps _4 Minutes _5 Row Name 10/07/15 1200 10/19/15 1200 10/21/15 1200 10/26/15 1200 10/28/15 1200     Exercise Review   Progression Yes Yes  - Yes  -     Response to Exercise   Blood Pressure (Admit) 94/50 88/50 90/46 110/56 100/56   Blood Pressure (Exercise) 110/64 98/68 132/74 104/60 142/70   Blood Pressure (Exit) 98/64 100/58 100/60 90/60  recheck BP after water 106/64 92/80   Heart Rate (Admit) 57 bpm 61 bpm 58 bpm 54 bpm 55 bpm   Heart Rate (Exercise) 64 bpm 70 bpm 74 bpm 84 bpm 88 bpm   Heart Rate (Exit) 61 bpm 57 bpm 58 bpm 60 bpm 62 bpm   Oxygen Saturation (Admit) 96 % 96 % 97 % 97 % 98 %   Oxygen Saturation (Exercise) 89 % 96 % 94 % 91 % 90 %   Oxygen Saturation (Exit) 93 % 57 % 94 % 94 % 94 %   Rating of Perceived Exertion (Exercise) _6 Perceived Dyspnea (Exercise) _7 Duration Progress to 45 minutes of aerobic exercise without signs/symptoms of physical distress Progress to 45 minutes of aerobic exercise without signs/symptoms of physical distress Progress to 45 minutes of aerobic exercise without signs/symptoms of physical distress Progress to 45 minutes of aerobic exercise without signs/symptoms of physical distress Progress to 45 minutes of aerobic exercise without  signs/symptoms of physical distress   Intensity _8      Progression   Progression Continue progressive overload as per policy without signs/symptoms or physical distress. Continue to progress workloads to maintain intensity without signs/symptoms of physical distress. Continue to progress workloads to maintain intensity without signs/symptoms of physical distress. Continue to progress workloads to maintain intensity without signs/symptoms of physical distress. Continue to progress workloads to maintain intensity without signs/symptoms of physical distress.     Resistance Training   Training Prescription _9   Weight _0    Reps 10-12 10-12  10 min 10-12  10 min 10-12  10 minutes of strength training 10-12  10 minutes of strength training     Interval Training   Interval Training _1      Oxygen   Oxygen _2    Liters _3 NuStep   Level  - _4 -   Minutes  - _5 -   METs  - 1.9 2 2.2  -     Arm Ergometer   Level 3 4  - 5 5   Minutes 17 17  - 17 17     Track   Laps 10 7  RD consult for 5 min 12  RD consult for 5 min 11 14   Minutes _6 Home Exercise Plan   Plans to continue exercise at  - Home  -  -  -   Frequency  - Add 3 additional days to program exercise sessions.  -  -  -   Row Name 11/09/15 1200 11/11/15 1200 11/18/15 1257 12/07/15 1200 12/09/15 1200     Exercise Review   Progression  -  - Yes  -  -     Response to Exercise   Blood Pressure (Admit) 106/60 100/60 102/54 100/60 106/58   Blood Pressure (Exercise) 120/60 122/70 144/64 120/74 124/60   Blood Pressure (Exit) 106/60 98/80 112/60 102/60 104/54   Heart Rate (Admit) 55 bpm 58 bpm 53 bpm 53 bpm 52 bpm   Heart Rate (Exercise) 81 bpm 76 bpm 81 bpm 72 bpm 81 bpm   Heart Rate (Exit)  58 bpm 51 bpm 87 bpm 57 bpm 66 bpm   Oxygen Saturation (Admit) 97 % 96 % 98 % 99 % 97 %   Oxygen Saturation (Exercise) 93 % 91 % 88 % 93 % 92 %   Oxygen Saturation (Exit) 93 % 94 % 94 % 94 % 92 %   Rating of Perceived Exertion (Exercise) _7 Perceived Dyspnea (Exercise) _8 Duration Progress to 45 minutes of aerobic exercise without signs/symptoms of physical distress Progress to 45 minutes of aerobic exercise without signs/symptoms of physical distress Progress to 45 minutes of aerobic exercise without signs/symptoms of physical distress Progress to 45 minutes of aerobic exercise without signs/symptoms of physical distress Progress to 45 minutes of aerobic exercise without signs/symptoms of physical distress   Intensity _9      Progression   Progression Continue to progress workloads to maintain intensity without signs/symptoms of physical distress. Continue to progress workloads to maintain intensity without signs/symptoms of physical distress. Continue to progress workloads to maintain intensity without signs/symptoms of physical distress. Continue to progress workloads to maintain intensity without signs/symptoms of physical distress. Continue to progress workloads to maintain intensity without signs/symptoms of physical distress.     Resistance Training   Training Prescription _10    Weight _11    Reps 10-12  10 minutes of strength training 10-12  10 minutes of strength training 10-12  10 minutes of strength training 10-12  10 minutes of strength training 10-12  10 minutes of strength training  Interval Training   Interval Training _0      Oxygen   Oxygen _1    Liters  -  -  - 2 2     NuStep   Level _2 Minutes _3 METs 2.3 2.5 1.9 2.6 2.7      Arm Ergometer   Level 5  -  -  -  -   Minutes 17  -  -  -  -     Track   Laps _4 Minutes _5 34 17   Row Name 12/14/15 1203 12/21/15 1200 12/23/15 1200         Exercise Review   Progression Yes  -  -       Response to Exercise   Blood Pressure (Admit) 106/60 124/58 114/70     Blood Pressure (Exercise) 96/60 122/70 114/60     Blood Pressure (Exit) 100/60 100/60 102/60     Heart Rate (Admit) 54 bpm 76 bpm 60 bpm     Heart Rate (Exercise) 76 bpm 107 bpm 83 bpm     Heart Rate (Exit) 58 bpm 69 bpm 63 bpm     Oxygen Saturation (Admit) 98 % 99 % 99 %     Oxygen Saturation (Exercise) 91 % 93 % 94 %     Oxygen Saturation (Exit) 94 % 92 % 94 %     Rating of Perceived Exertion (Exercise) _6 Perceived Dyspnea (Exercise) 0 3 3     Duration Progress to 45 minutes of aerobic exercise without signs/symptoms of physical distress Progress to 45 minutes of aerobic exercise without signs/symptoms of physical distress Progress to 45 minutes of aerobic exercise without signs/symptoms of physical distress     Intensity THRR unchanged THRR unchanged THRR unchanged       Progression   Progression Continue to progress workloads to maintain intensity without signs/symptoms of physical distress. Continue to progress workloads to maintain intensity without signs/symptoms of physical distress. Continue to progress workloads to maintain intensity without signs/symptoms of physical distress.       Resistance Training   Training Prescription Yes Yes Yes     Weight blue bands blue bands blue bands     Reps 10-12  10 minutes of strength training 10-12  10 minutes of strength training 10-12  10 minutes of strength training       Interval Training   Interval Training No No No       Oxygen   Oxygen Continuous Continuous Continuous     Liters _7 NuStep   Level _8 Minutes 34 17 17     METs 2.5 2.5 2.5       Track   Laps _9 Minutes 17 34 17         Exercise Comments:     Exercise Comments    Row Name 10/04/15 1137 10/19/15 1707 11/01/15 0940 11/30/15 0714 12/27/15 1023   Exercise Comments Patient has completed 4 sessons. Two workload increases. Will cont. to monitor.  home exercise completed After I completed home exercise with patient and discussed goals, I can tell a difference in how hard the patient works each time he comes to class. He has really stepped up  his intensity. Will cont. to monitor.  Patient is not exercising at home. Only performing ADL's. Discussed with patient how important home exercise is. Patient stated understanding. Patient is in [place] for 2 weeks. Will f/u when patient returns.  Patient is slowly progressing in program. Encouraged to exercise at home. up to 15 laps in 15 minutes. Will cont. to monitor and progress.       Discharge Exercise Prescription (Final Exercise Prescription Changes):     Exercise Prescription Changes - 12/23/15 1200      Response to Exercise   Blood Pressure (Admit) 114/70   Blood Pressure (Exercise) 114/60   Blood Pressure (Exit) 102/60   Heart Rate (Admit) 60 bpm   Heart Rate (Exercise) 83 bpm   Heart Rate (Exit) 63 bpm   Oxygen Saturation (Admit) 99 %   Oxygen Saturation (Exercise) 94 %   Oxygen Saturation (Exit) 94 %   Rating of Perceived Exertion (Exercise) 13   Perceived Dyspnea (Exercise) 3   Duration Progress to 45 minutes of aerobic exercise without signs/symptoms of physical distress   Intensity THRR unchanged     Progression   Progression Continue to progress workloads to maintain intensity without signs/symptoms of physical distress.     Resistance Training   Training Prescription Yes   Weight blue bands   Reps 10-12  10 minutes of strength training     Interval Training   Interval Training No     Oxygen   Oxygen Continuous   Liters 2     NuStep   Level 6   Minutes 17   METs 2.5     Track   Laps 15   Minutes 17       Nutrition:   Target Goals: Understanding of nutrition guidelines, daily intake of sodium <1566m, cholesterol <202m calories 30% from fat and 7% or less from saturated fats, daily to have 5 or more servings of fruits and vegetables.  Biometrics:     Pre Biometrics - 09/13/15 1112      Pre Biometrics   Grip Strength 40 kg       Nutrition Therapy Plan and Nutrition Goals:     Nutrition Therapy & Goals - 10/07/15 1504      Nutrition Therapy   Diet Carb Modified, Therapeutic Lifestyle Changes     Personal Nutrition Goals   Personal Goal #1 1-2 lb wt loss/week to a wt loss goal of 6-24 lb at graduation     [place] and counsel regarding individualized specific dietary modifications aiming towards targeted core components such as weight, hypertension, lipid management, diabetes, heart failure and other comorbidities.   Expected Outcomes Short Term Goal: Understand basic principles of dietary content, such as calories, fat, sodium, cholesterol and nutrients.;Long Term Goal: Adherence to prescribed nutrition plan.      Nutrition Discharge: Rate Your Plate Scores:     Nutrition Assessments - 10/19/15 1202      Rate Your Plate Scores   Pre Score 46      Psychosocial: Target Goals: Acknowledge presence or absence of depression, maximize coping skills, provide positive support system. Participant is able to verbalize types and ability to use techniques and skills needed for reducing stress and depression.  Initial Review & Psychosocial Screening:     Initial Psych Review & Screening - 09/13/15 [id number]     Barriers   Psychosocial barriers to participate  in program There are no identifiable barriers or psychosocial needs.     Screening Interventions   Interventions Encouraged to exercise      Quality of Life Scores:   PHQ-9: Recent Review Flowsheet Data    Depression screen [facility] 2/9  09/13/2015   Decreased Interest 0   Down, Depressed, Hopeless 0   PHQ - 2 Score 0      Psychosocial Evaluation and Intervention:     Psychosocial Evaluation - 09/13/15 1110      Psychosocial Evaluation & Interventions   Interventions Encouraged to exercise with the program and follow exercise prescription      Psychosocial Re-Evaluation:     Psychosocial Re-Evaluation    [place] Name 10/05/15 1610 11/02/15 1008 11/30/15 0654 12/28/15 0900       Psychosocial Re-Evaluation   Interventions Encouraged to attend Pulmonary Rehabilitation for the exercise Encouraged to attend Pulmonary Rehabilitation for the exercise Encouraged to attend Pulmonary Rehabilitation for the exercise Encouraged to attend Pulmonary Rehabilitation for the exercise    Comments there are no psychosocial barriers identified in the last 30 days there are no psychosocial barriers identified in the last 30 days there are no psychosocial barriers identified in the last 30 days there are no psychosocial barriers identified in the last 30 days      Education: Education Goals: Education classes will be provided on a weekly basis, covering required topics. Participant will state understanding/return demonstration of topics presented.  Learning Barriers/Preferences:     Learning Barriers/Preferences - 09/13/15 1105      Learning Barriers/Preferences   Learning Barriers None   Learning Preferences Written Material;Individual Instruction;Group Instruction;Computer/Internet      Education Topics: Risk Factor Reduction:  -Group instruction that is supported by a PowerPoint presentation. Instructor discusses the definition of a risk factor, different risk factors for pulmonary disease, and how the heart and lungs work together.     Nutrition for Pulmonary Patient:  -Group instruction provided by PowerPoint slides, verbal discussion, and written materials to support subject matter. The instructor gives an explanation and  review of healthy diet recommendations, which includes a discussion on weight management, recommendations for fruit and vegetable consumption, as well as protein, fluid, caffeine, fiber, sodium, sugar, and alcohol. Tips for eating when patients are short of breath are discussed.   Pursed Lip Breathing:  -Group instruction that is supported by demonstration and informational handouts. Instructor discusses the benefits of pursed lip and diaphragmatic breathing and detailed demonstration on how to preform both.   Flowsheet Row PULMONARY REHAB OTHER RESPIRATORY from 12/23/2015 in [place]  Date  11/04/15  Educator  EP  Instruction Review Code  R- Review/reinforce      Oxygen Safety:  -Group instruction provided by PowerPoint, verbal discussion, and written material to support subject matter. There is an overview of "What is Oxygen" and "Why do we need it".  Instructor also reviews how to create a safe environment for oxygen use, the importance of using oxygen as prescribed, and the risks of noncompliance. There is a brief discussion on traveling with oxygen and resources the patient may utilize.   Oxygen Equipment:  -Group instruction provided by [facility] Staff utilizing handouts, written materials, and equipment demonstrations. Flowsheet Row PULMONARY REHAB OTHER RESPIRATORY from 12/23/2015 in [place]  Date  12/09/15  Educator  rep  Instruction Review Code  2- meets goals/outcomes      Signs and Symptoms:  -Group instruction provided by  written material and verbal discussion to support subject matter. Warning signs and symptoms of infection, stroke, and heart attack are reviewed and when to call the physician/911 reinforced. Tips for preventing the spread of infection discussed. Flowsheet Row PULMONARY REHAB OTHER RESPIRATORY from 12/23/2015 in [place]  Date  10/21/15  Educator  rn   Instruction Review Code  2- meets goals/outcomes      Advanced Directives:  -Group instruction provided by verbal instruction and written material to support subject matter. Instructor reviews Advanced Directive laws and proper instruction for filling out document.   Pulmonary Video:  -Group video education that reviews the importance of medication and oxygen compliance, exercise, good nutrition, pulmonary hygiene, and pursed lip and diaphragmatic breathing for the pulmonary patient. Flowsheet Row PULMONARY REHAB OTHER RESPIRATORY from 12/23/2015 in [place]  Date  11/11/15  Educator  video  Instruction Review Code  2- meets goals/outcomes      Exercise for the Pulmonary Patient:  -Group instruction that is supported by a PowerPoint presentation. Instructor discusses benefits of exercise, core components of exercise, frequency, duration, and intensity of an exercise routine, importance of utilizing pulse oximetry during exercise, safety while exercising, and options of places to exercise outside of rehab.   Flowsheet Row PULMONARY REHAB OTHER RESPIRATORY from 12/23/2015 in [place]  Date  10/14/15  Educator  ep  Instruction Review Code  R- Review/reinforce      Pulmonary Medications:  -Verbally interactive group education provided by instructor with focus on inhaled medications and proper administration.   Anatomy and Physiology of the Respiratory System and Intimacy:  -Group instruction provided by PowerPoint, verbal discussion, and written material to support subject matter. Instructor reviews respiratory cycle and anatomical components of the respiratory system and their functions. Instructor also reviews differences in obstructive and restrictive respiratory diseases with examples of each. Intimacy, Sex, and Sexuality differences are reviewed with a discussion on how relationships can change when diagnosed with  pulmonary disease. Common sexual concerns are reviewed.   Knowledge Questionnaire Score:   Core Components/Risk Factors/Patient Goals at Admission:     Personal Goals and Risk Factors at Admission - 09/13/15 1108      Core Components/Risk Factors/Patient Goals on Admission    Weight Management Yes;Obesity   Intervention Weight Management: Develop a combined nutrition and exercise program designed to reach desired caloric intake, while maintaining appropriate intake of nutrient and fiber, sodium and fats, and appropriate energy expenditure required for the weight goal.;Weight Management: Provide education and appropriate resources to help participant work on and attain dietary goals.;Weight Management/Obesity: Establish reasonable short term and long term weight goals.;Obesity: Provide education and appropriate resources to help participant work on and attain dietary goals.   Expected Outcomes Short Term: Continue to assess and modify interventions until short term weight is achieved;Weight Loss: Understanding of general recommendations for a balanced deficit meal plan, which promotes 1-2 lb weight loss per week and includes a negative energy balance of [phone] kcal/d;Understanding recommendations for meals to include 15-35% energy as protein, 25-35% energy from fat, 35-60% energy from carbohydrates, less than 250m of dietary cholesterol, 20-35 gm of total fiber daily;Understanding of distribution of calorie intake throughout the day with the consumption of 4-5 meals/snacks   Increase Strength and Stamina Yes   Intervention Provide advice, education, support and counseling about physical activity/exercise needs.;Develop an individualized exercise prescription for aerobic and resistive training based on initial evaluation findings, risk  stratification, comorbidities and participant's personal goals.   Expected Outcomes Achievement of increased cardiorespiratory fitness and enhanced flexibility,  muscular endurance and strength shown through measurements of functional capacity and personal statement of participant.   Improve shortness of breath with ADL's Yes   Intervention Provide education, individualized exercise plan and daily activity instruction to help decrease symptoms of SOB with activities of daily living.   Expected Outcomes Short Term: Achieves a reduction of symptoms when performing activities of daily living.   Develop more efficient breathing techniques such as purse lipped breathing and diaphragmatic breathing; and practicing self-pacing with activity Yes   Intervention Provide education, demonstration and support about specific breathing techniuqes utilized for more efficient breathing. Include techniques such as pursed lipped breathing, diaphragmatic breathing and self-pacing activity.   Expected Outcomes Short Term: Participant will be able to demonstrate and use breathing techniques as needed throughout daily activities.      Core Components/Risk Factors/Patient Goals Review:      Goals and Risk Factor Review    Row Name 10/05/15 0831 11/02/15 1008 11/30/15 0654 12/28/15 0900       Core Components/Risk Factors/Patient Goals Review   Personal Goals Review Weight Management/Obesity;Increase Strength and Stamina;Develop more efficient breathing techniques such as purse lipped breathing and diaphragmatic breathing and practicing self-pacing with activity.;Improve shortness of breath with ADL's Weight Management/Obesity;Increase Strength and Stamina;Develop more efficient breathing techniques such as purse lipped breathing and diaphragmatic breathing and practicing self-pacing with activity.;Improve shortness of breath with ADL's Weight Management/Obesity;Increase Strength and Stamina;Develop more efficient breathing techniques such as purse lipped breathing and diaphragmatic breathing and practicing self-pacing with activity.;Improve shortness of breath with ADL's Weight  Management/Obesity;Increase Strength and Stamina;Develop more efficient breathing techniques such as purse lipped breathing and diaphragmatic breathing and practicing self-pacing with activity.;Improve shortness of breath with ADL's    Review see comments section on ITP see comments section on ITP see comments section on ITP see comments section on ITP    Expected Outcomes see admission expected outcomes see admission expected outcomes see admission expected outcomes see admission expected outcomes       Core Components/Risk Factors/Patient Goals at Discharge (Final Review):      Goals and Risk Factor Review - 12/28/15 0900      Core Components/Risk Factors/Patient Goals Review   Personal Goals Review Weight Management/Obesity;Increase Strength and Stamina;Develop more efficient breathing techniques such as purse lipped breathing and diaphragmatic breathing and practicing self-pacing with activity.;Improve shortness of breath with ADL's   Review see comments section on ITP   Expected Outcomes see admission expected outcomes      ITP Comments:   Comments: ITP REVIEW Pt is making expected progress towards all pulmonary rehab goals except weight loss after completing 20 sessions. Recommend continued exercise, life style modification, education, and utilization of breathing techniques to increase stamina and strength and decrease shortness of breath with exertion.

## 2015-12-28 NOTE — Telephone Encounter (Signed)
Received a handi capp parking form.Dr.Jordan signed form.Called patient advised form will be left at [facility] office front desk for pick up.

## 2015-12-29 ENCOUNTER — Telehealth: Payer: Self-pay | Admitting: Cardiology

## 2015-12-29 NOTE — Telephone Encounter (Signed)
Request for surgical clearance:  1. What type of surgery is being performed? Left CTR, left thumb and middle A1 pul;ley release and tenosynovectomy, Left Thumb   2. When is this surgery scheduled? 02-08-2016   3. Are there any medications that need to be held prior to surgery and how long? xarelto   4. Name of physician performing surgery? Roseanne Kaufman Md; [facility]   5. What is your office phone and fax number? P: [phone] F:[phone] Attn: Orson Slick

## 2015-12-30 ENCOUNTER — Encounter (HOSPITAL_COMMUNITY)
Admission: RE | Admit: 2015-12-30 | Discharge: 2015-12-30 | Disposition: A | Discharge status: Home / Self Care | Payer: Medicare Other | Source: Ambulatory Visit | Attending: Pulmonary Disease | Admitting: Pulmonary Disease

## 2015-12-30 VITALS — Wt 277.8 lb

## 2015-12-30 DIAGNOSIS — R0609 Other forms of dyspnea: Secondary | ICD-10-CM | POA: Diagnosis not present

## 2015-12-30 DIAGNOSIS — G4733 Obstructive sleep apnea (adult) (pediatric): Secondary | ICD-10-CM | POA: Diagnosis not present

## 2015-12-30 DIAGNOSIS — R5381 Other malaise: Secondary | ICD-10-CM | POA: Diagnosis not present

## 2015-12-30 DIAGNOSIS — Z6841 Body Mass Index (BMI) 40.0 and over, adult: Secondary | ICD-10-CM | POA: Diagnosis not present

## 2015-12-30 DIAGNOSIS — J4530 Mild persistent asthma, uncomplicated: Secondary | ICD-10-CM | POA: Diagnosis not present

## 2015-12-30 DIAGNOSIS — J9611 Chronic respiratory failure with hypoxia: Secondary | ICD-10-CM

## 2015-12-30 NOTE — Telephone Encounter (Signed)
He is cleared for hand surgery. Hold Xarelto 48 hours prior.  Carime Dinkel Martinique MD, [facility]

## 2015-12-30 NOTE — Progress Notes (Signed)
Daily Session Note  Patient Details  Name: Carlos Dawson MRN: [id number] Date of Birth: 1940-05-16 Referring Provider:   [name] Pulmonary Rehab Walk Test from 09/14/2015 in [place]  Referring Provider  Dr. Halford Chessman      Encounter Date: 12/30/2015  Check In:     Session Check In - 12/30/15 1104      Check-In   Location [facility]   Staff Present Rosebud Poles, RN, BSN;Lisa Ysidro Evert, RN;Zemira Zehring, MS, ACSM RCEP, Exercise Physiologist;Portia Rollene Rotunda, RN, BSN   Supervising physician immediately available to respond to emergencies [facility] immediately available   Physician(s) Dr. Allyson Sabal   Medication changes reported     No   Fall or balance concerns reported    No   Warm-up and Cool-down Performed as group-led instruction   Resistance Training Performed Yes   VAD Patient? No     Pain Assessment   Currently in Pain? No/denies   Multiple Pain Sites No      Capillary Blood Glucose: No results found for this or any previous visit (from the past 24 hour(s)).     POCT Glucose - 12/30/15 1214      POCT Blood Glucose   Pre-Exercise 245 mg/dL   Post-Exercise 110 mg/dL         Exercise Prescription Changes - 12/30/15 1200      Response to Exercise   Blood Pressure (Admit) 106/60   Blood Pressure (Exercise) 134/80   Blood Pressure (Exit) 104/60   Heart Rate (Admit) 52 bpm   Heart Rate (Exercise) 71 bpm   Heart Rate (Exit) 57 bpm   Oxygen Saturation (Admit) 90 %   Oxygen Saturation (Exercise) 92 %   Oxygen Saturation (Exit) 93 %   Rating of Perceived Exertion (Exercise) 13   Perceived Dyspnea (Exercise) 1   Duration Progress to 45 minutes of aerobic exercise without signs/symptoms of physical distress   Intensity THRR unchanged     Progression   Progression Continue to progress workloads to maintain intensity without signs/symptoms of physical distress.     Resistance Training   Training Prescription  Yes   Weight blue bands   Reps 10-12  10 minutes of strength training     Interval Training   Interval Training No     Oxygen   Oxygen Continuous   Liters 2     Track   Laps 28   Minutes 34     Goals Met:  Exercise tolerated well No report of cardiac concerns or symptoms Strength training completed today  Goals Unmet:  Not Applicable  Comments: Service time is from 10:30am to 12:15pm    Dr. Rush Farmer is Medical Director for Pulmonary Rehab at [facility].

## 2016-01-03 ENCOUNTER — Encounter: Payer: Self-pay | Admitting: Pulmonary Disease

## 2016-01-03 ENCOUNTER — Ambulatory Visit (INDEPENDENT_AMBULATORY_CARE_PROVIDER_SITE_OTHER): Payer: Medicare Other | Admitting: Pulmonary Disease

## 2016-01-03 VITALS — BP 110/60 | HR 57

## 2016-01-03 DIAGNOSIS — I2510 Atherosclerotic heart disease of native coronary artery without angina pectoris: Secondary | ICD-10-CM

## 2016-01-03 DIAGNOSIS — J4530 Mild persistent asthma, uncomplicated: Secondary | ICD-10-CM | POA: Diagnosis not present

## 2016-01-03 DIAGNOSIS — R0600 Dyspnea, unspecified: Secondary | ICD-10-CM

## 2016-01-03 DIAGNOSIS — G4733 Obstructive sleep apnea (adult) (pediatric): Secondary | ICD-10-CM | POA: Diagnosis not present

## 2016-01-03 DIAGNOSIS — M4802 Spinal stenosis, cervical region: Secondary | ICD-10-CM | POA: Diagnosis not present

## 2016-01-03 MED ORDER — AZITHROMYCIN 250 MG PO TABS: ORAL_TABLET | ORAL | 0 refills | Status: DC

## 2016-01-03 NOTE — Progress Notes (Signed)
Carlos Dawson    VM:[id number]    24-Nov-1940  Primary Care Physician:GATES,ROBERT NEVILL, MD  Referring Physician: Josetta Huddle, MD [address], [place] [address]  Chief complaint:   Acute visit for cough, congestion.  HPI: Carlos Dawson is a 75 year old with past medical history of mild persistent asthma, OSA, A. fib, PE. He is maintained on Symbicort and albuterol rescue inhaler. He complains of cough with chest congestion for the past 2 days. His mucus is green in color. He denies any fevers, chills. Does not have any worsening of his dyspnea, wheezing. He is not using his albuterol rescue inhaler.   Outpatient Encounter Prescriptions as of 01/03/2016  Medication Sig  . acetaminophen (TYLENOL) 500 MG tablet Take 500 mg by mouth as needed.  Marland Kitchen albuterol (PROAIR HFA) 108 (90 Base) MCG/ACT inhaler 2 puffs every 4 hours as needed only  if your can't catch your breath (Patient taking differently: 2 puffs every 4 hours as needed for wheezing and shortness of breath)  . atenolol (TENORMIN) 25 MG tablet Take 25 mg by mouth every morning.   Marland Kitchen atorvastatin (LIPITOR) 10 MG tablet Take 10 mg by mouth daily.  . budesonide-formoterol (SYMBICORT) 160-4.5 MCG/ACT inhaler Inhale 2 puffs into the lungs 2 (two) times daily.  . celecoxib (CELEBREX) 200 MG capsule Take 200 mg by mouth 2 (two) times daily.  . fluticasone (FLONASE) 50 MCG/ACT nasal spray Place into both nostrils daily as needed (nasal congestion).  . fluticasone (FLOVENT HFA) 110 MCG/ACT inhaler Inhale into the lungs.  . furosemide (LASIX) 80 MG tablet Take 1 tablet (80 mg total) by mouth daily. Take 80 mg in the morning and 40 mg in the afternoon.  Marland Kitchen KLOR-CON M20 20 MEQ tablet TAKE 2 TABLETS (40 MEQ TOTAL) BY MOUTH 2 (TWO) TIMES DAILY.  Marland Kitchen losartan (COZAAR) 25 MG tablet Take 1 tablet (25 mg total) by mouth daily.  . metFORMIN (GLUCOPHAGE) 500 MG tablet Take 500 mg by mouth daily with breakfast.   .  montelukast (SINGULAIR) 10 MG tablet Take 10 mg by mouth at bedtime.  . nitroGLYCERIN (NITROSTAT) 0.4 MG SL tablet Place 1 tablet (0.4 mg total) under the tongue every 5 (five) minutes as needed for chest pain.  Marland Kitchen omeprazole (PRILOSEC) 20 MG capsule Take 20 mg by mouth daily before breakfast.  . OXYGEN Inhale into the lungs. Use CPAP at bedtime  . rivaroxaban (XARELTO) 20 MG TABS tablet Take 20 mg by mouth.  . sildenafil (VIAGRA) 100 MG tablet Take 100 mg by mouth.  . spironolactone (ALDACTONE) 25 MG tablet Take 25 mg by mouth at bedtime.   . tamsulosin (FLOMAX) 0.4 MG CAPS Take 0.4 mg by mouth daily.  Marland Kitchen tiZANidine (ZANAFLEX) 4 MG tablet Take 4 mg by mouth 3 (three) times daily as needed.  . topiramate (TOPAMAX) 25 MG capsule Take 2 tablets by mouth twice a day  . traMADol (ULTRAM) 50 MG tablet Take 50 mg by mouth.   No facility-administered encounter medications on file as of 01/03/2016.     Allergies as of 01/03/2016 - Review Complete 01/03/2016  Allergen Reaction Noted  . Morphine Itching   . Adhesive [tape] Itching and Rash 09/15/2010  . Latex Itching and Rash 09/11/2014    Past Medical History:  Diagnosis Date  . Anticoagulant long-term use   . Aortic root enlargement ([place])   . Ascending aortic aneurysm [facility])    recent scan in October 2012  showing no change; followed by Dr. Servando Snare  . ASCVD (arteriosclerotic cardiovascular disease)    Prior BMS to the 2nd OM in September 2012; with repeat cath in October showing patency  . CAD (coronary artery disease)    a. s/p BMS to 2nd OM in Sept 2012; b. LexiScan Myoview (12/2012):  Inf infarct; bowel and motion artifact make study difficult to interpret; no ischemia; not gated; Low Risk  . CHF (congestive heart failure) ([place])    no recent issues 10/13/14  . Colonic polyp   . Contact lens/glasses fitting   . Diabetes mellitus without complication (HCC)    metphormin, average 154  . Diastolic dysfunction   . Generalized headaches     neck stenosis  . GERD (gastroesophageal reflux disease)   . Hearing loss   . Hemorrhoids   . Hypertension   . IBS (irritable bowel syndrome)   . LVH (left ventricular hypertrophy)   . Mild intermittent asthma   . OA (osteoarthritis)   . Obesities, morbid ([place])   . OSA (obstructive sleep apnea)    PSG 03/30/97 AHI 21, BPAP 13/9  . PAF (paroxysmal atrial fibrillation) ([place])    treated with Coumadin  . Prostate CA [facility])    Oncologist  DR. Daralene Milch [facility] 09/24/14, undetermined tx   . Pulmonary embolism ([place])    2008  . Sleep apnea   . SOB (shortness of breath)    on excertion  . Thoracic aortic aneurysm (HCC)    Aortic Size Index=     5.0    /Body surface area is 2.43 meters squared. = 2.05  < 2.75 cm/m2      4% risk per year 2.75 to 4.25          8% risk per year > 4.25 cm/m2    20% risk per year   Stable aneurysmal dilation of the ascending aorta with maximum AP diameter of 4.8 cm. Stable area of narrowing of the proximal most portion of the descending aorta measuring 2 cm., previously identified as an area of coarctation. No evidence of aortic dissection.  Coronary artery disease.  Normal appearance of the lungs.   Electronically Signed   By: Fidela Salisbury M.D.   On: 10/01/2014 08:50      Past Surgical History:  Procedure Laterality Date  . ACHILLES TENDON REPAIR    . APPENDECTOMY    . CARDIAC CATHETERIZATION  2006  . CARDIAC CATHETERIZATION  October 2012   Stent patent  . CERVICAL SPINE SURGERY  06/02/2010   lower back and neck  . CHOLECYSTECTOMY    . COLONOSCOPY WITH PROPOFOL N/A 12/29/2014   Procedure: COLONOSCOPY WITH PROPOFOL;  Surgeon: Garlan Fair, MD;  Location: [facility];  Service: Endoscopy;  Laterality: N/A;  . CORONARY STENT PLACEMENT  Sept 2012   2nd OM with BMS  . EYE SURGERY     bilateral cataract  . HEMORROIDECTOMY    . LAMINECTOMY  05/30/2012   L 4 L5  . LAPAROSCOPIC GASTRIC BANDING    . LEFT AND RIGHT HEART CATHETERIZATION WITH CORONARY  ANGIOGRAM N/A 05/07/2014   Procedure: LEFT AND RIGHT HEART CATHETERIZATION WITH CORONARY ANGIOGRAM;  Surgeon: [name] Martinique, MD;  Location: [facility] CATH LAB;  Service: Cardiovascular;  Laterality: N/A;  . ROTATOR CUFF REPAIR     both  . UVULOPALATOPHARYNGOPLASTY    . VASECTOMY      Family History  Problem Relation Age of Onset  . Heart disease Mother   . Diabetes  Mother   . Other Mother     stent placement  . Emphysema Father 36  . Heart attack Sister     Social History   Social History  . Marital status: Married    Spouse name: N/A  . Number of children: 3  . Years of education: N/A   Occupational History  . Retired from [employer] Retired   Social History Main Topics  . Smoking status: Former Smoker    Packs/day: 1.50    Years: 30.00    Quit date: 01/24/1992  . Smokeless tobacco: Never Used     Comment: Stopped in 1983  . Alcohol use No  . Drug use: No  . Sexual activity: Yes   Other Topics Concern  . Not on file   Social History Narrative  . No narrative on file     Review of systems: Review of Systems  Constitutional: Negative for fever and chills.  HENT: Negative.   Eyes: Negative for blurred vision.  Respiratory: as per HPI  Cardiovascular: Negative for chest pain and palpitations.  Gastrointestinal: Negative for vomiting, diarrhea, blood per rectum. Genitourinary: Negative for dysuria, urgency, frequency and hematuria.  Musculoskeletal: Negative for myalgias, back pain and joint pain.  Skin: Negative for itching and rash.  Neurological: Negative for dizziness, tremors, focal weakness, seizures and loss of consciousness.  Endo/Heme/Allergies: Negative for environmental allergies.  Psychiatric/Behavioral: Negative for depression, suicidal ideas and hallucinations.  All other systems reviewed and are negative.   Physical Exam: Blood pressure 110/60, pulse (!) 57, SpO2 95 %. Gen:      No acute distress HEENT:  EOMI, sclera anicteric Neck:     No masses; no  thyromegaly Lungs:    Clear to auscultation bilaterally; normal respiratory effort CV:         Regular rate and rhythm; no murmurs Abd:      + bowel sounds; soft, non-tender; no palpable masses, no distension Ext:    No edema; adequate peripheral perfusion Skin:      Warm and dry; no rash Neuro: alert and oriented x 3 Psych: normal mood and affect  Data Reviewed: CXR 04/22/15- No acute cardio pulmonary abnormality CT scan 10/14/15-stable ascending aorta aneurysm. No lung abnormalities. All images reviewed  PFTs 4/80/17 FVC 2.43 (61%] FEV1 1.68 [55 %) F/F 69 Moderate obstruction.  Assessment:  Mild persistent asthma. Acute bronchitis.  Carlos Dawson is here with acute bronchitis. He is not wheezing in the office today and I don't believe he'll require prednisone. I'll treat him with a Z-Pak. He'll use Mucinex DM over-the-counter for cough and congestion.  Plan/Recommendations: - Z-Pak - Mucinex DM - Continue using Symbicort and albuterol when necessary.  Marshell Garfinkel MD [facility] Pager [phone] 01/03/2016, 1:41 PM  CC: Josetta Huddle, MD

## 2016-01-03 NOTE — Patient Instructions (Addendum)
We'll prescribe a Z-Pak. Azithromycin 500 mg on day 1 and then 250mg /day for total 5 days. Please use Mucinex DM over-the-counter for cough.  Follow-up in 3 months.

## 2016-01-04 ENCOUNTER — Encounter (HOSPITAL_COMMUNITY)
Admission: RE | Admit: 2016-01-04 | Discharge: 2016-01-04 | Disposition: A | Discharge status: Home / Self Care | Payer: Medicare Other | Source: Ambulatory Visit | Attending: Pulmonary Disease | Admitting: Pulmonary Disease

## 2016-01-04 VITALS — Wt 278.9 lb

## 2016-01-04 DIAGNOSIS — J9611 Chronic respiratory failure with hypoxia: Secondary | ICD-10-CM

## 2016-01-04 DIAGNOSIS — Z6841 Body Mass Index (BMI) 40.0 and over, adult: Secondary | ICD-10-CM | POA: Diagnosis not present

## 2016-01-04 DIAGNOSIS — J4530 Mild persistent asthma, uncomplicated: Secondary | ICD-10-CM | POA: Diagnosis not present

## 2016-01-04 DIAGNOSIS — G4733 Obstructive sleep apnea (adult) (pediatric): Secondary | ICD-10-CM | POA: Diagnosis not present

## 2016-01-04 DIAGNOSIS — R5381 Other malaise: Secondary | ICD-10-CM | POA: Diagnosis not present

## 2016-01-04 DIAGNOSIS — R0609 Other forms of dyspnea: Secondary | ICD-10-CM | POA: Diagnosis not present

## 2016-01-04 NOTE — Telephone Encounter (Signed)
Returned call to patient.Dr.Jordan cleared you for upcoming surgery.Advised ok to hold Xarelto 48 hours prior to surgery.Note faxed to [facility] at [facility] at fax # [phone].

## 2016-01-04 NOTE — Progress Notes (Signed)
Daily Session Note  Patient Details  Name: Carlos Dawson MRN: [id number] Date of Birth: 06-08-1940 Referring Provider:   [name] Pulmonary Rehab Walk Test from 09/14/2015 in [place]  Referring Provider  Dr. Halford Chessman      Encounter Date: 01/04/2016  Check In:     Session Check In - 01/04/16 1030      Check-In   Location [facility]   Staff Present Rosebud Poles, RN, BSN;Molly diVincenzo, MS, ACSM RCEP, Exercise Physiologist;Lisa Ysidro Evert, RN;Ngai Parcell Rollene Rotunda, RN, BSN   Supervising physician immediately available to respond to emergencies [facility] immediately available   Physician(s) Dr. Nada Maclachlan   Medication changes reported     No   Fall or balance concerns reported    No   Warm-up and Cool-down Performed as group-led instruction   Resistance Training Performed Yes   VAD Patient? No     Pain Assessment   Currently in Pain? No/denies   Multiple Pain Sites No      Capillary Blood Glucose: No results found for this or any previous visit (from the past 24 hour(s)).      Exercise Prescription Changes - 01/04/16 1206      Response to Exercise   Blood Pressure (Admit) 102/60   Blood Pressure (Exercise) 118/60   Blood Pressure (Exit) 102/54   Heart Rate (Admit) 53 bpm   Heart Rate (Exercise) 84 bpm   Heart Rate (Exit) 68 bpm   Oxygen Saturation (Admit) 98 %   Oxygen Saturation (Exercise) 91 %   Oxygen Saturation (Exit) 92 %   Rating of Perceived Exertion (Exercise) 12   Perceived Dyspnea (Exercise) 2   Duration Progress to 45 minutes of aerobic exercise without signs/symptoms of physical distress   Intensity THRR unchanged     Progression   Progression Continue to progress workloads to maintain intensity without signs/symptoms of physical distress.     Resistance Training   Training Prescription Yes   Weight blue bands   Reps 10-12  10 minutes of strength training     Interval Training   Interval Training  No     Oxygen   Oxygen Continuous   Liters 2     NuStep   Level 7   Minutes 17   METs 4     Track   Laps 29   Minutes 34     Goals Met:  Independence with exercise equipment Improved SOB with ADL's Using PLB without cueing & demonstrates good technique Exercise tolerated well No report of cardiac concerns or symptoms Strength training completed today  Goals Unmet:  Not Applicable  Comments: Service time is from 1030 to 1200   Dr. Rush Farmer is Medical Director for Pulmonary Rehab at [facility].

## 2016-01-06 ENCOUNTER — Encounter (HOSPITAL_COMMUNITY)
Admission: RE | Admit: 2016-01-06 | Discharge: 2016-01-06 | Disposition: A | Discharge status: Home / Self Care | Payer: Medicare Other | Source: Ambulatory Visit | Attending: Pulmonary Disease | Admitting: Pulmonary Disease

## 2016-01-06 DIAGNOSIS — R5381 Other malaise: Secondary | ICD-10-CM | POA: Diagnosis not present

## 2016-01-06 DIAGNOSIS — J4530 Mild persistent asthma, uncomplicated: Secondary | ICD-10-CM | POA: Diagnosis not present

## 2016-01-06 DIAGNOSIS — Z6841 Body Mass Index (BMI) 40.0 and over, adult: Secondary | ICD-10-CM | POA: Diagnosis not present

## 2016-01-06 DIAGNOSIS — G4733 Obstructive sleep apnea (adult) (pediatric): Secondary | ICD-10-CM | POA: Diagnosis not present

## 2016-01-06 DIAGNOSIS — R0609 Other forms of dyspnea: Secondary | ICD-10-CM | POA: Diagnosis not present

## 2016-01-06 DIAGNOSIS — J9611 Chronic respiratory failure with hypoxia: Secondary | ICD-10-CM

## 2016-01-11 ENCOUNTER — Encounter (HOSPITAL_COMMUNITY): Payer: Medicare Other

## 2016-01-13 ENCOUNTER — Encounter (HOSPITAL_COMMUNITY): Admission: RE | Admit: 2016-01-13 | Payer: Medicare Other | Source: Ambulatory Visit

## 2016-01-18 ENCOUNTER — Encounter (HOSPITAL_COMMUNITY): Payer: Medicare Other

## 2016-01-20 ENCOUNTER — Encounter (HOSPITAL_COMMUNITY): Payer: Medicare Other

## 2016-01-25 ENCOUNTER — Encounter (HOSPITAL_COMMUNITY): Payer: Medicare Other

## 2016-01-27 ENCOUNTER — Encounter (HOSPITAL_COMMUNITY): Payer: Medicare Other

## 2016-02-01 ENCOUNTER — Encounter (HOSPITAL_COMMUNITY): Payer: Medicare Other

## 2016-02-03 ENCOUNTER — Encounter (HOSPITAL_COMMUNITY): Payer: Medicare Other

## 2016-02-04 NOTE — Addendum Note (Signed)
Encounter addended by: Jewel Baize, RD on: 02/04/2016  9:20 AM<BR>    Actions taken: Flowsheet data copied forward, Visit Navigator Flowsheet section accepted

## 2016-02-08 ENCOUNTER — Encounter (HOSPITAL_COMMUNITY): Payer: Self-pay

## 2016-02-08 ENCOUNTER — Encounter (HOSPITAL_COMMUNITY): Payer: Medicare Other

## 2016-02-08 DIAGNOSIS — G5602 Carpal tunnel syndrome, left upper limb: Secondary | ICD-10-CM | POA: Diagnosis not present

## 2016-02-08 DIAGNOSIS — M65312 Trigger thumb, left thumb: Secondary | ICD-10-CM | POA: Diagnosis not present

## 2016-02-08 DIAGNOSIS — M65332 Trigger finger, left middle finger: Secondary | ICD-10-CM | POA: Diagnosis not present

## 2016-02-08 DIAGNOSIS — M65842 Other synovitis and tenosynovitis, left hand: Secondary | ICD-10-CM | POA: Diagnosis not present

## 2016-02-08 NOTE — Progress Notes (Signed)
Discharge Summary  Patient Details  Name: Carlos Dawson MRN: [id number] Date of Birth: Jun 10, 1940 Referring Provider:   [name] Pulmonary Rehab Walk Test from 09/14/2015 in [place]  Referring Provider  Dr. Halford Chessman       Number of Visits: 24  Reason for Discharge:  Patient reached a stable level of exercise. Patient independent in their exercise.  Smoking History:  History  Smoking Status  . Former Smoker  . Packs/day: 1.50  . Years: 30.00  . Quit date: 01/24/1992  Smokeless Tobacco  . Never Used    Comment: Stopped in 1983    Diagnosis:  No diagnosis found.  ADL UCSD:     Pulmonary Assessment Scores    Row Name 01/06/16 1613         ADL UCSD   ADL Phase Exit     SOB Score total 37        Initial Exercise Prescription:     Initial Exercise Prescription - 09/14/15 1600      Date of Initial Exercise RX and Referring Provider   Date 09/14/15   Referring Provider Dr. Halford Chessman     Oxygen   Oxygen Continuous   Liters 2     NuStep   Level 1   Minutes 17   METs 1.5     Arm Ergometer   Level 1   Minutes 17     Track   Laps 5   Minutes 17     Prescription Details   Frequency (times per week) 2   Duration Progress to 45 minutes of aerobic exercise without signs/symptoms of physical distress     Intensity   THRR 40-80% of Max Heartrate 59-118   Ratings of Perceived Exertion 11-13   Perceived Dyspnea 0-4     Progression   Progression Continue progressive overload as per policy without signs/symptoms or physical distress.     Resistance Training   Training Prescription Yes   Weight blue bands   Reps 10-12      Discharge Exercise Prescription (Final Exercise Prescription Changes):     Exercise Prescription Changes - 01/04/16 1206      Response to Exercise   Blood Pressure (Admit) 102/60   Blood Pressure (Exercise) 118/60   Blood Pressure (Exit) 102/54   Heart Rate (Admit) 53 bpm   Heart Rate  (Exercise) 84 bpm   Heart Rate (Exit) 68 bpm   Oxygen Saturation (Admit) 98 %   Oxygen Saturation (Exercise) 91 %   Oxygen Saturation (Exit) 92 %   Rating of Perceived Exertion (Exercise) 12   Perceived Dyspnea (Exercise) 2   Duration Progress to 45 minutes of aerobic exercise without signs/symptoms of physical distress   Intensity THRR unchanged     Progression   Progression Continue to progress workloads to maintain intensity without signs/symptoms of physical distress.     Resistance Training   Training Prescription Yes   Weight blue bands   Reps 10-12  10 minutes of strength training     Interval Training   Interval Training No     Oxygen   Oxygen Continuous   Liters 2     NuStep   Level 7   Minutes 17   METs 4     Track   Laps 29   Minutes 34      Functional Capacity:     6 Minute Walk    Row Name 09/14/15 1626 01/06/16 1404  6 Minute Walk   Phase Initial Discharge    Distance 1210 feet 1268 feet    Walk Time 6 minutes 6 minutes    # of Rest Breaks 0 0    MPH 2.29 2.4    METS 2.68 2.84    RPE 13 13    Perceived Dyspnea  2 2    Symptoms Yes (comment) No    Comments leg fatigue  -    Resting HR 75 bpm 75 bpm    Resting BP 120/60 110/58    Max Ex. HR 104 bpm 87 bpm    Max Ex. BP 148/62 132/70    2 Minute Post BP 126/60  -      Interval HR   Baseline HR 75 75    1 Minute HR 85 69    2 Minute HR 92 81    3 Minute HR 93 81    4 Minute HR 97 -  no measurement    5 Minute HR 104 87    6 Minute HR 94 87    2 Minute Post HR 85 70    Interval Heart Rate? Yes Yes      Interval Oxygen   Interval Oxygen? Yes Yes    Baseline Oxygen Saturation % 96 % 94 %    Baseline Liters of Oxygen 2 L 2 L    1 Minute Oxygen Saturation % 95 % 92 %    1 Minute Liters of Oxygen 2 L 2 L    2 Minute Oxygen Saturation % 93 % 87 %    2 Minute Liters of Oxygen 2 L 2 L    3 Minute Oxygen Saturation % 91 % 87 %    3 Minute Liters of Oxygen 2 L 2 L    4 Minute  Oxygen Saturation % 91 % 90 %    4 Minute Liters of Oxygen 2 L 2 L    5 Minute Oxygen Saturation % 90 % 91 %    5 Minute Liters of Oxygen 2 L 2 L    6 Minute Oxygen Saturation % 90 % 91 %    6 Minute Liters of Oxygen 2 L 2 L    2 Minute Post Oxygen Saturation % 93 % 96 %    2 Minute Post Liters of Oxygen 2 L 2 L       Psychological, QOL, Others - Outcomes: PHQ 2/9: Depression screen [facility] 2/9 01/06/2016 09/13/2015  Decreased Interest 0 0  Down, Depressed, Hopeless 0 0  PHQ - 2 Score 0 0  Some recent data might be hidden    Quality of Life:     Quality of Life - 01/06/16 1613      Quality of Life Scores   Health/Function Post 24.8 %   Socioeconomic Post 21.33 %   Psych/Spiritual Post 30 %   Family Post 25.5 %   GLOBAL Post 25.03 %      Personal Goals: Goals established at orientation with interventions provided to work toward goal.     Personal Goals and Risk Factors at Admission - 09/13/15 1108      Core Components/Risk Factors/Patient Goals on Admission    Weight Management Yes;Obesity   Intervention Weight Management: Develop a combined nutrition and exercise program designed to reach desired caloric intake, while maintaining appropriate intake of nutrient and fiber, sodium and fats, and appropriate energy expenditure required for the weight goal.;Weight Management: Provide education and appropriate resources to help  participant work on and attain dietary goals.;Weight Management/Obesity: Establish reasonable short term and long term weight goals.;Obesity: Provide education and appropriate resources to help participant work on and attain dietary goals.   Expected Outcomes Short Term: Continue to assess and modify interventions until short term weight is achieved;Weight Loss: Understanding of general recommendations for a balanced deficit meal plan, which promotes 1-2 lb weight loss per week and includes a negative energy balance of [phone] kcal/d;Understanding  recommendations for meals to include 15-35% energy as protein, 25-35% energy from fat, 35-60% energy from carbohydrates, less than 264m of dietary cholesterol, 20-35 gm of total fiber daily;Understanding of distribution of calorie intake throughout the day with the consumption of 4-5 meals/snacks   Increase Strength and Stamina Yes   Intervention Provide advice, education, support and counseling about physical activity/exercise needs.;Develop an individualized exercise prescription for aerobic and resistive training based on initial evaluation findings, risk stratification, comorbidities and participant's personal goals.   Expected Outcomes Achievement of increased cardiorespiratory fitness and enhanced flexibility, muscular endurance and strength shown through measurements of functional capacity and personal statement of participant.   Improve shortness of breath with ADL's Yes   Intervention Provide education, individualized exercise plan and daily activity instruction to help decrease symptoms of SOB with activities of daily living.   Expected Outcomes Short Term: Achieves a reduction of symptoms when performing activities of daily living.   Develop more efficient breathing techniques such as purse lipped breathing and diaphragmatic breathing; and practicing self-pacing with activity Yes   Intervention Provide education, demonstration and support about specific breathing techniuqes utilized for more efficient breathing. Include techniques such as pursed lipped breathing, diaphragmatic breathing and self-pacing activity.   Expected Outcomes Short Term: Participant will be able to demonstrate and use breathing techniques as needed throughout daily activities.       Personal Goals Discharge:     Goals and Risk Factor Review    Row Name 10/05/15 0831 11/02/15 1008 11/30/15 0654 12/28/15 0900       Core Components/Risk Factors/Patient Goals Review   Personal Goals Review Weight  Management/Obesity;Increase Strength and Stamina;Develop more efficient breathing techniques such as purse lipped breathing and diaphragmatic breathing and practicing self-pacing with activity.;Improve shortness of breath with ADL's Weight Management/Obesity;Increase Strength and Stamina;Develop more efficient breathing techniques such as purse lipped breathing and diaphragmatic breathing and practicing self-pacing with activity.;Improve shortness of breath with ADL's Weight Management/Obesity;Increase Strength and Stamina;Develop more efficient breathing techniques such as purse lipped breathing and diaphragmatic breathing and practicing self-pacing with activity.;Improve shortness of breath with ADL's Weight Management/Obesity;Increase Strength and Stamina;Develop more efficient breathing techniques such as purse lipped breathing and diaphragmatic breathing and practicing self-pacing with activity.;Improve shortness of breath with ADL's    Review see comments section on ITP see comments section on ITP see comments section on ITP see comments section on ITP    Expected Outcomes see admission expected outcomes see admission expected outcomes see admission expected outcomes see admission expected outcomes       Nutrition & Weight - Outcomes:     Pre Biometrics - 09/13/15 1112      Pre Biometrics   Grip Strength 40 kg       Nutrition:     Nutrition Therapy & Goals - 10/07/15 1504      Nutrition Therapy   Diet Carb Modified, Therapeutic Lifestyle Changes     Personal Nutrition Goals   Personal Goal #1 1-2 lb wt loss/week to a wt loss goal of 6-24 lb  at graduation     [place], educate and counsel regarding individualized specific dietary modifications aiming towards targeted core components such as weight, hypertension, lipid management, diabetes, heart failure and other comorbidities.   Expected Outcomes Short Term Goal: Understand basic principles of  dietary content, such as calories, fat, sodium, cholesterol and nutrients.;Long Term Goal: Adherence to prescribed nutrition plan.      Nutrition Discharge:     Nutrition Assessments - 02/04/16 0918      Rate Your Plate Scores   Pre Score 46   Post Score 52      Education Questionnaire Score:     Knowledge Questionnaire Score - 01/06/16 1613      Knowledge Questionnaire Score   Post Score 10/13      Patient met most of his program/personal goals while in pulmonary rehab. He continues to struggle with weight loss. He actually gained weight while in the program. He feels his stamina and strength has increased. He stated he did better than he has expected recently while traveling out of the country. He plans to continue his home exercise by walking.

## 2016-02-14 DIAGNOSIS — G5603 Carpal tunnel syndrome, bilateral upper limbs: Secondary | ICD-10-CM | POA: Diagnosis not present

## 2016-02-14 DIAGNOSIS — M65312 Trigger thumb, left thumb: Secondary | ICD-10-CM | POA: Diagnosis not present

## 2016-02-14 DIAGNOSIS — M65332 Trigger finger, left middle finger: Secondary | ICD-10-CM | POA: Diagnosis not present

## 2016-02-14 DIAGNOSIS — Z4789 Encounter for other orthopedic aftercare: Secondary | ICD-10-CM | POA: Diagnosis not present

## 2016-02-15 DIAGNOSIS — R5383 Other fatigue: Secondary | ICD-10-CM | POA: Diagnosis not present

## 2016-02-15 DIAGNOSIS — R3981 Functional urinary incontinence: Secondary | ICD-10-CM | POA: Diagnosis not present

## 2016-02-15 DIAGNOSIS — Z08 Encounter for follow-up examination after completed treatment for malignant neoplasm: Secondary | ICD-10-CM | POA: Diagnosis not present

## 2016-02-15 DIAGNOSIS — Z9981 Dependence on supplemental oxygen: Secondary | ICD-10-CM | POA: Diagnosis not present

## 2016-02-15 DIAGNOSIS — I739 Peripheral vascular disease, unspecified: Secondary | ICD-10-CM | POA: Diagnosis not present

## 2016-02-15 DIAGNOSIS — J449 Chronic obstructive pulmonary disease, unspecified: Secondary | ICD-10-CM | POA: Diagnosis not present

## 2016-02-15 DIAGNOSIS — C61 Malignant neoplasm of prostate: Secondary | ICD-10-CM | POA: Diagnosis not present

## 2016-02-15 DIAGNOSIS — Z923 Personal history of irradiation: Secondary | ICD-10-CM | POA: Diagnosis not present

## 2016-02-15 DIAGNOSIS — Z8546 Personal history of malignant neoplasm of prostate: Secondary | ICD-10-CM | POA: Diagnosis not present

## 2016-02-19 DIAGNOSIS — J029 Acute pharyngitis, unspecified: Secondary | ICD-10-CM | POA: Diagnosis not present

## 2016-02-19 DIAGNOSIS — J329 Chronic sinusitis, unspecified: Secondary | ICD-10-CM | POA: Diagnosis not present

## 2016-02-19 DIAGNOSIS — H6592 Unspecified nonsuppurative otitis media, left ear: Secondary | ICD-10-CM | POA: Diagnosis not present

## 2016-02-22 DIAGNOSIS — R200 Anesthesia of skin: Secondary | ICD-10-CM | POA: Diagnosis not present

## 2016-02-25 ENCOUNTER — Telehealth: Payer: Self-pay | Admitting: Cardiology

## 2016-02-25 NOTE — Telephone Encounter (Signed)
New message      Calling to see if he can bring a paper by today for Dr Martinique to sign so that he can start an exercise program on monday

## 2016-02-25 NOTE — Telephone Encounter (Signed)
Spoke to patient. Notes this request is actually for his wife. Separate encounter made.

## 2016-02-28 ENCOUNTER — Encounter (HOSPITAL_COMMUNITY): Payer: Self-pay

## 2016-02-28 DIAGNOSIS — G4730 Sleep apnea, unspecified: Secondary | ICD-10-CM | POA: Diagnosis not present

## 2016-02-28 DIAGNOSIS — J029 Acute pharyngitis, unspecified: Secondary | ICD-10-CM | POA: Diagnosis not present

## 2016-02-28 DIAGNOSIS — E119 Type 2 diabetes mellitus without complications: Secondary | ICD-10-CM | POA: Diagnosis not present

## 2016-02-28 DIAGNOSIS — E049 Nontoxic goiter, unspecified: Secondary | ICD-10-CM | POA: Diagnosis not present

## 2016-03-02 DIAGNOSIS — E049 Nontoxic goiter, unspecified: Secondary | ICD-10-CM | POA: Diagnosis not present

## 2016-03-03 NOTE — Progress Notes (Signed)
[facility] [employer] PREP Weekly Session   Patient Details  Name: Carlos Dawson MRN: [id number] Date of Birth: 04/27/1940 Age: 76 y.o. PCP: Henrine Screws, MD  Vitals:   03/03/16 1014  Weight: 282 lb 12.8 oz (128.3 kg)    First class attendance.  Hasn't yet met with a trainer for exercise instruction.   Vanita Ingles 03/03/2016, 10:14 AM

## 2016-03-06 DIAGNOSIS — Z4789 Encounter for other orthopedic aftercare: Secondary | ICD-10-CM | POA: Diagnosis not present

## 2016-03-06 DIAGNOSIS — M65312 Trigger thumb, left thumb: Secondary | ICD-10-CM | POA: Diagnosis not present

## 2016-03-08 NOTE — Progress Notes (Signed)
[facility] PRE[facility] Weekly Session   Patient Details  Name: NICOLAOS LASHWAY MRN: [id number] Date of Birth: [birth year] Age: 76 y.o. PCP: Henrine Screws, MD  Vitals:   03/08/16 1119  Weight: 281 lb 3.2 oz (127.6 kg)        [facility] Weekly seesion - 03/08/16 1100      Weekly Session   Topic Discussed Other  guest speaker   Minutes exercised this week 30 minutes  cardio   Classes attended to date 2     Fun things you've done since last meeting: "valentine dinner last night" Things you are grateful for:"Family and life. God above all" Barriers: "breathing"  Vanita Ingles 03/08/2016, 11:19 AM

## 2016-03-09 ENCOUNTER — Ambulatory Visit (INDEPENDENT_AMBULATORY_CARE_PROVIDER_SITE_OTHER): Payer: Medicare Other | Admitting: Orthopaedic Surgery

## 2016-03-09 ENCOUNTER — Ambulatory Visit (INDEPENDENT_AMBULATORY_CARE_PROVIDER_SITE_OTHER): Payer: Self-pay

## 2016-03-09 ENCOUNTER — Encounter (INDEPENDENT_AMBULATORY_CARE_PROVIDER_SITE_OTHER): Payer: Self-pay | Admitting: Orthopaedic Surgery

## 2016-03-09 ENCOUNTER — Ambulatory Visit (INDEPENDENT_AMBULATORY_CARE_PROVIDER_SITE_OTHER): Payer: Medicare Other

## 2016-03-09 VITALS — BP 122/74 | HR 56 | Ht 66.0 in | Wt 268.0 lb

## 2016-03-09 DIAGNOSIS — M25551 Pain in right hip: Secondary | ICD-10-CM | POA: Diagnosis not present

## 2016-03-09 NOTE — Progress Notes (Signed)
Office Visit Note   Patient: Carlos Dawson           Date of Birth: [birth year]           MRN: [id number] Visit Date: 03/09/2016              Requested by: Josetta Huddle, MD [address] [place], [place] [address] PCP: Henrine Screws, MD   Assessment & Plan: Visit Diagnoses: Low back, right buttock and right groin pain that shoulder could be a combination of several factors. I believe some pain is referred from the lumbar spine. There is a good chance that some of the discomfort is referred from an arthritic right hip   Plan: Consult Dr. Ernestina Patches to consider cortisone injection right hip and monitor the response. Long discussion with Mr. and Mrs. Damita Dunnings regarding diagnostic possibilities. Might want to consider an MRI scan of the pelvis if no improvement with a hip injection might consider  referral back to neurosurgery  Follow-Up Instructions: No Follow-up on file.   Orders:  No orders of the defined types were placed in this encounter.  No orders of the defined types were placed in this encounter.     Procedures: No procedures performed   Clinical Data: No additional findings.   Subjective: No chief complaint on file.   Pt presents with right hip pain for 6 months, worse last 3 months, and now his Right hip/pelvic area pain radiates from hip to shin. Pt goes to his neurosurgeon, Dr. Ronnald Ramp every 6 months for a checkup. He has mentioned this to him, never had any xrays but did mention he may need an MRI.   Mr. Ruley is experiencing pain in the sacral area to the right. He also experiencing buttock pain lateral right hip pain and referred discomfort into the groin with occasional right thigh pain. He is not experiencing numbness or tingling  Review of Systems   Objective: Vital Signs: There were no vitals taken for this visit.  Physical Exam  Ortho Exam Mr. Schierer is a large man. Straight leg raise is negative bilaterally no localized tenderness  over either greater trochanter. Certainly has some pain with internal/external rotation of his right hip on extremes of internal and external rotation. Pain is experienced in the groin and anterior thigh. No localized tenderness over the greater trochanter. No percussible tenderness of the lumbar spine or sacrum.  Specialty Comments:  No specialty comments available.  Imaging: No results found.   PMFS History: Patient Active Problem List   Diagnosis Date Noted  . Severe obesity (BMI >= 40) ([place]) 04/26/2015  . Asthma with acute exacerbation 04/22/2015  . CAP (community acquired pneumonia) 08/12/2013  . Encounter for therapeutic drug monitoring 03/03/2013  . Chest pain at rest 01/06/2013  . Chronic diastolic heart failure ([place]) 12/26/2012  . HLD (hyperlipidemia) 12/26/2012  . Allergic rhinitis 12/24/2012  . Long term (current) use of anticoagulants 08/29/2012  . Lapband APL May 2009 10/25/2011  . Hypoxemia 12/08/2010  . Dyspnea 12/08/2010  . Hypertension   . Obesities, morbid ([place])   . Pulmonary embolism ([place])   . Anticoagulant long-term use   . CAD (coronary artery disease)   . Thoracic aortic aneurysm ([place])   . OA (osteoarthritis)   . LVH (left ventricular hypertrophy)   . Paroxysmal atrial fibrillation (HCC)   . Mild persistent asthma, well controlled 10/21/2008  . OSA (obstructive sleep apnea) 08/06/2008   Past Medical History:  Diagnosis Date  . Anticoagulant  long-term use   . Aortic root enlargement ([place])   . Ascending aortic aneurysm [facility])    recent scan in October 2012 showing no change; followed by Dr. Servando Snare  . ASCVD (arteriosclerotic cardiovascular disease)    Prior BMS to the 2nd OM in September 2012; with repeat cath in October showing patency  . CAD (coronary artery disease)    a. s/p BMS to 2nd OM in Sept 2012; b. LexiScan Myoview (12/2012):  Inf infarct; bowel and motion artifact make study difficult to interpret; no ischemia; not gated; Low Risk  . CHF  (congestive heart failure) ([place])    no recent issues 10/13/14  . Colonic polyp   . Contact lens/glasses fitting   . Diabetes mellitus without complication (HCC)    metphormin, average 154  . Diastolic dysfunction   . Generalized headaches    neck stenosis  . GERD (gastroesophageal reflux disease)   . Hearing loss   . Hemorrhoids   . Hypertension   . IBS (irritable bowel syndrome)   . LVH (left ventricular hypertrophy)   . Mild intermittent asthma   . OA (osteoarthritis)   . Obesities, morbid ([place])   . OSA (obstructive sleep apnea)    PSG 03/30/97 AHI 21, BPAP 13/9  . PAF (paroxysmal atrial fibrillation) ([place])    treated with Coumadin  . Prostate CA [facility])    Oncologist  DR. Daralene Milch [facility] 09/24/14, undetermined tx   . Pulmonary embolism ([place])    2008  . Sleep apnea   . SOB (shortness of breath)    on excertion  . Thoracic aortic aneurysm (HCC)    Aortic Size Index=     5.0    /Body surface area is 2.43 meters squared. = 2.05  < 2.75 cm/m2      4% risk per year 2.75 to 4.25          8% risk per year > 4.25 cm/m2    20% risk per year   Stable aneurysmal dilation of the ascending aorta with maximum AP diameter of 4.8 cm. Stable area of narrowing of the proximal most portion of the descending aorta measuring 2 cm., previously identified as an area of coarctation. No evidence of aortic dissection.  Coronary artery disease.  Normal appearance of the lungs.   Electronically Signed   By: Fidela Salisbury M.D.   On: 10/01/2014 08:50      Family History  Problem Relation Age of Onset  . Heart disease Mother   . Diabetes Mother   . Other Mother     stent placement  . Emphysema Father 19  . Heart attack Sister     Past Surgical History:  Procedure Laterality Date  . ACHILLES TENDON REPAIR    . APPENDECTOMY    . CARDIAC CATHETERIZATION  2006  . CARDIAC CATHETERIZATION  October 2012   Stent patent  . CERVICAL SPINE SURGERY  06/02/2010   lower back and neck  . CHOLECYSTECTOMY      . COLONOSCOPY WITH PROPOFOL N/A 12/29/2014   Procedure: COLONOSCOPY WITH PROPOFOL;  Surgeon: Garlan Fair, MD;  Location: [facility] ENDOSCOPY;  Service: Endoscopy;  Laterality: N/A;  . CORONARY STENT PLACEMENT  Sept 2012   2nd OM with BMS  . EYE SURGERY     bilateral cataract  . HEMORROIDECTOMY    . LAMINECTOMY  05/30/2012   L 4 L5  . LAPAROSCOPIC GASTRIC BANDING    . LEFT AND RIGHT HEART CATHETERIZATION WITH CORONARY ANGIOGRAM N/A 05/07/2014  Procedure: LEFT AND RIGHT HEART CATHETERIZATION WITH CORONARY ANGIOGRAM;  Surgeon: Kebron Pulse M Martinique, MD;  Location: [facility] C[facility] LAB;  Service: Cardiovascular;  Laterality: N/A;  . ROTATOR CUFF REPAIR     both  . UVULOPALATOPHARYNGOPLASTY    . VASECTOMY     Social History   Occupational History  . Retired from [employer]   Social History Main Topics  . Smoking status: Former Smoker    Packs/day: 1.50    Years: 30.00    Quit date: 01/24/1992  . Smokeless tobacco: Never Used     Comment: Stopped in 1983  . Alcohol use No  . Drug use: No  . Sexual activity: Yes

## 2016-03-15 ENCOUNTER — Encounter (HOSPITAL_COMMUNITY): Payer: Self-pay | Admitting: Emergency Medicine

## 2016-03-15 ENCOUNTER — Emergency Department (HOSPITAL_COMMUNITY)
Admission: EM | Admit: 2016-03-15 | Discharge: 2016-03-15 | Disposition: A | Discharge status: Home / Self Care | Payer: Medicare Other | Attending: Emergency Medicine | Admitting: Emergency Medicine

## 2016-03-15 ENCOUNTER — Emergency Department (HOSPITAL_COMMUNITY): Payer: Medicare Other

## 2016-03-15 DIAGNOSIS — Z955 Presence of coronary angioplasty implant and graft: Secondary | ICD-10-CM | POA: Diagnosis not present

## 2016-03-15 DIAGNOSIS — M549 Dorsalgia, unspecified: Secondary | ICD-10-CM

## 2016-03-15 DIAGNOSIS — I110 Hypertensive heart disease with heart failure: Secondary | ICD-10-CM | POA: Insufficient documentation

## 2016-03-15 DIAGNOSIS — E119 Type 2 diabetes mellitus without complications: Secondary | ICD-10-CM | POA: Diagnosis not present

## 2016-03-15 DIAGNOSIS — Z7984 Long term (current) use of oral hypoglycemic drugs: Secondary | ICD-10-CM | POA: Insufficient documentation

## 2016-03-15 DIAGNOSIS — Z87891 Personal history of nicotine dependence: Secondary | ICD-10-CM | POA: Diagnosis not present

## 2016-03-15 DIAGNOSIS — Z7901 Long term (current) use of anticoagulants: Secondary | ICD-10-CM | POA: Insufficient documentation

## 2016-03-15 DIAGNOSIS — M545 Low back pain: Secondary | ICD-10-CM | POA: Diagnosis present

## 2016-03-15 DIAGNOSIS — Z79899 Other long term (current) drug therapy: Secondary | ICD-10-CM | POA: Diagnosis not present

## 2016-03-15 DIAGNOSIS — I2510 Atherosclerotic heart disease of native coronary artery without angina pectoris: Secondary | ICD-10-CM | POA: Insufficient documentation

## 2016-03-15 DIAGNOSIS — I5032 Chronic diastolic (congestive) heart failure: Secondary | ICD-10-CM | POA: Insufficient documentation

## 2016-03-15 DIAGNOSIS — Z91040 Latex allergy status: Secondary | ICD-10-CM | POA: Insufficient documentation

## 2016-03-15 LAB — PROTIME-INR
INR: 1.57
Prothrombin Time: 19 seconds — ABNORMAL HIGH (ref 11.4–15.2)

## 2016-03-15 LAB — COMPREHENSIVE METABOLIC PANEL
ALT: 19 U/L (ref 17–63)
AST: 25 U/L (ref 15–41)
Albumin: 3.9 g/dL (ref 3.5–5.0)
Alkaline Phosphatase: 89 U/L (ref 38–126)
Anion gap: 10 (ref 5–15)
BUN: 20 mg/dL (ref 6–20)
CO2: 25 mmol/L (ref 22–32)
Calcium: 9.2 mg/dL (ref 8.9–10.3)
Chloride: 105 mmol/L (ref 101–111)
Creatinine, Ser: 1.09 mg/dL (ref 0.61–1.24)
GFR calc Af Amer: >60 mL/min (ref >60)
GFR calc non Af Amer: >60 mL/min (ref >60)
Glucose, Bld: 132 mg/dL — ABNORMAL HIGH (ref 65–99)
Potassium: 3.8 mmol/L (ref 3.5–5.1)
Sodium: 140 mmol/L (ref 135–145)
Total Bilirubin: 1.3 mg/dL — ABNORMAL HIGH (ref 0.3–1.2)
Total Protein: 6.9 g/dL (ref 6.5–8.1)

## 2016-03-15 LAB — CBC WITH DIFFERENTIAL/PLATELET
Basophils Absolute: 0 10*3/uL (ref 0.0–0.1)
Basophils Relative: 0 %
Eosinophils Absolute: 0.1 10*3/uL (ref 0.0–0.7)
Eosinophils Relative: 2 %
HCT: 39.8 % (ref 39.0–52.0)
Hemoglobin: 12.8 g/dL — ABNORMAL LOW (ref 13.0–17.0)
Lymphocytes Relative: 26 %
Lymphs Abs: 1.8 10*3/uL (ref 0.7–4.0)
MCH: 29.2 pg (ref 26.0–34.0)
MCHC: 32.2 g/dL (ref 30.0–36.0)
MCV: 90.9 fL (ref 78.0–100.0)
Monocytes Absolute: 0.5 10*3/uL (ref 0.1–1.0)
Monocytes Relative: 8 %
Neutro Abs: 4.5 10*3/uL (ref 1.7–7.7)
Neutrophils Relative %: 64 %
Platelets: 144 10*3/uL — ABNORMAL LOW (ref 150–400)
RBC: 4.38 MIL/uL (ref 4.22–5.81)
RDW: 14.2 % (ref 11.5–15.5)
WBC: 6.9 10*3/uL (ref 4.0–10.5)

## 2016-03-15 MED ORDER — DEXAMETHASONE SODIUM PHOSPHATE 10 MG/ML IJ SOLN
10.0000 mg | Freq: Once | INTRAMUSCULAR | Status: AC
  Administered 2016-03-15: 10 mg via INTRAVENOUS
  Filled 2016-03-15: qty 1

## 2016-03-15 MED ORDER — METHOCARBAMOL 500 MG PO TABS: 500.0000 mg | ORAL_TABLET | Freq: Two times a day (BID) | ORAL | 0 refills | Status: DC | PRN

## 2016-03-15 MED ORDER — PREDNISONE 20 MG PO TABS: 40.0000 mg | ORAL_TABLET | Freq: Every day | ORAL | 0 refills | Status: DC

## 2016-03-15 MED ORDER — METHOCARBAMOL 500 MG PO TABS
500.0000 mg | ORAL_TABLET | Freq: Once | ORAL | Status: AC
  Administered 2016-03-15: 500 mg via ORAL
  Filled 2016-03-15: qty 1

## 2016-03-15 MED ORDER — OXYCODONE-ACETAMINOPHEN 7.5-325 MG PO TABS: 1.0000 | ORAL_TABLET | Freq: Four times a day (QID) | ORAL | 0 refills | Status: DC | PRN

## 2016-03-15 MED ORDER — HYDROMORPHONE HCL 2 MG/ML IJ SOLN
0.5000 mg | Freq: Once | INTRAMUSCULAR | Status: AC
  Administered 2016-03-15: 0.5 mg via INTRAVENOUS
  Filled 2016-03-15: qty 1

## 2016-03-15 MED ORDER — GADOBENATE DIMEGLUMINE 529 MG/ML IV SOLN
20.0000 mL | Freq: Once | INTRAVENOUS | Status: AC | PRN
  Administered 2016-03-15: 20 mL via INTRAVENOUS

## 2016-03-15 MED ORDER — HYDROMORPHONE HCL 2 MG/ML IJ SOLN
1.0000 mg | Freq: Once | INTRAMUSCULAR | Status: AC
  Administered 2016-03-15: 1 mg via INTRAVENOUS
  Filled 2016-03-15: qty 1

## 2016-03-15 NOTE — ED Triage Notes (Addendum)
Back pain  X 3 months , this am pain is different while in showerit got worse pain goes down rt leg and groin hurts to move and walk, has hx of surgeries, has hx of AAA last checked last AUG

## 2016-03-15 NOTE — Discharge Instructions (Signed)
Please obtain all of your results from medical records or have your doctors office obtain the results - share them with your doctor - you should be seen at your doctors office in the next 2 days. Call today to arrange your follow up. Take the medications as prescribed. Please review all of the medicines and only take them if you do not have an allergy to them. Please be aware that if you are taking birth control pills, taking other prescriptions, ESPECIALLY ANTIBIOTICS may make the birth control ineffective - if this is the case, either do not engage in sexual activity or use alternative methods of birth control such as condoms until you have finished the medicine and your family doctor says it is OK to restart them. If you are on a blood thinner such as COUMADIN, be aware that any other medicine that you take may cause the coumadin to either work too much, or not enough - you should have your coumadin level rechecked in next 7 days if this is the case.  ?  It is also a possibility that you have an allergic reaction to any of the medicines that you have been prescribed - Everybody reacts differently to medications and while MOST people have no trouble with most medicines, you may have a reaction such as nausea, vomiting, rash, swelling, shortness of breath. If this is the case, please stop taking the medicine immediately and contact your physician.  ?  You should return to the ER if you develop severe or worsening symptoms.    Oxycodone for severe pain, one tablet every 6 hours Robaxin every 6 hours as needed for muscle spasm Prednisone daily for 5 days Call Dr. Ronnald Ramp for follow up appointment within 1 week

## 2016-03-15 NOTE — ED Provider Notes (Signed)
[facility] DEPT Provider Note   CSN: [id number] Arrival date & time: 03/15/16  1137  By signing my name below, I, Ethelle Lyon Long, attest that this documentation has been prepared under the direction and in the presence of Noemi Chapel, MD . Electronically Signed: Ethelle Lyon Long, Scribe. 03/15/2016. 2:01 PM.   History   Chief Complaint Chief Complaint  Patient presents with  . Back Pain    The history is provided by the patient. No language interpreter was used.    HPI Comments:  Carlos Dawson is a morbidly obese 76 y.o. male with a PMHx CAD, Ascending thoracic aneurysm (5.0 cm at last check 9/17) , CHF, Dm, HTN, PE, and prostate CA and PSHx of cholecystectomy, five back surgeries, and 1 neck surgery, who presents to the Emergency Department complaining of gradually worsening, severe, right-sided, lateral/lower back pain onset this morning. Pt reports he has had months of back pain but this morning it worsened as he went to take a shower and could not get out without assistance d/t the onset of this acute back pain. He states the pain radiates down his right buttock, right leg, and over to his right testicle. He states he is unsteady when ambulating d/t pain.  Has seen orthopedist in the past [facility]) - imaging of hip was benign - told might be back and now he has severe back pain.  No pacemaker present. Pt is currently on Xarelto for afib. Pt states flexion/extension and walking position of his left leg exacerbates his pain. Pt denies numbness, weakness, fever, and any other associated symptoms at this time.   Past Medical History:  Diagnosis Date  . Anticoagulant long-term use   . Aortic root enlargement ([place])   . Ascending aortic aneurysm [facility])    recent scan in October 2012 showing no change; followed by Dr. Servando Snare  . ASCVD (arteriosclerotic cardiovascular disease)    Prior BMS to the 2nd OM in September 2012; with repeat cath in October showing patency  . CAD  (coronary artery disease)    a. s/p BMS to 2nd OM in Sept 2012; b. LexiScan Myoview (12/2012):  Inf infarct; bowel and motion artifact make study difficult to interpret; no ischemia; not gated; Low Risk  . CHF (congestive heart failure) ([place])    no recent issues 10/13/14  . Colonic polyp   . Contact lens/glasses fitting   . Diabetes mellitus without complication (HCC)    metphormin, average 154  . Diastolic dysfunction   . Generalized headaches    neck stenosis  . GERD (gastroesophageal reflux disease)   . Hearing loss   . Hemorrhoids   . Hypertension   . IBS (irritable bowel syndrome)   . LVH (left ventricular hypertrophy)   . Mild intermittent asthma   . OA (osteoarthritis)   . Obesities, morbid ([place])   . OSA (obstructive sleep apnea)    PSG 03/30/97 AHI 21, BPAP 13/9  . PAF (paroxysmal atrial fibrillation) ([place])    treated with Coumadin  . Prostate CA [facility])    Oncologist  DR. Daralene Milch [facility] 09/24/14, undetermined tx   . Pulmonary embolism ([place])    2008  . Sleep apnea   . SOB (shortness of breath)    on excertion  . Thoracic aortic aneurysm (HCC)    Aortic Size Index=     5.0    /Body surface area is 2.43 meters squared. = 2.05  < 2.75 cm/m2      4% risk per year  2.75 to 4.25          8% risk per year > 4.25 cm/m2    20% risk per year   Stable aneurysmal dilation of the ascending aorta with maximum AP diameter of 4.8 cm. Stable area of narrowing of the proximal most portion of the descending aorta measuring 2 cm., previously identified as an area of coarctation. No evidence of aortic dissection.  Coronary artery disease.  Normal appearance of the lungs.   Electronically Signed   By: Fidela Salisbury M.D.   On: 10/01/2014 08:50      Patient Active Problem List   Diagnosis Date Noted  . Severe obesity (BMI >= 40) ([place]) 04/26/2015  . Asthma with acute exacerbation 04/22/2015  . CAP (community acquired pneumonia) 08/12/2013  . Encounter for therapeutic drug monitoring  03/03/2013  . Chest pain at rest 01/06/2013  . Chronic diastolic heart failure ([place]) 12/26/2012  . HLD (hyperlipidemia) 12/26/2012  . Allergic rhinitis 12/24/2012  . Long term (current) use of anticoagulants 08/29/2012  . Lapband APL May 2009 10/25/2011  . Hypoxemia 12/08/2010  . Dyspnea 12/08/2010  . Hypertension   . Obesities, morbid ([place])   . Pulmonary embolism ([place])   . Anticoagulant long-term use   . CAD (coronary artery disease)   . Thoracic aortic aneurysm ([place])   . OA (osteoarthritis)   . LVH (left ventricular hypertrophy)   . Paroxysmal atrial fibrillation (HCC)   . Mild persistent asthma, well controlled 10/21/2008  . OSA (obstructive sleep apnea) 08/06/2008    Past Surgical History:  Procedure Laterality Date  . ACHILLES TENDON REPAIR    . APPENDECTOMY    . CARDIAC CATHETERIZATION  2006  . CARDIAC CATHETERIZATION  October 2012   Stent patent  . CERVICAL SPINE SURGERY  06/02/2010   lower back and neck  . CHOLECYSTECTOMY    . COLONOSCOPY WITH PROPOFOL N/A 12/29/2014   Procedure: COLONOSCOPY WITH PROPOFOL;  Surgeon: Garlan Fair, MD;  Location: [facility] ENDOSCOPY;  Service: Endoscopy;  Laterality: N/A;  . CORONARY STENT PLACEMENT  Sept 2012   2nd OM with BMS  . EYE SURGERY     bilateral cataract  . HEMORROIDECTOMY    . LAMINECTOMY  05/30/2012   L 4 L5  . LAPAROSCOPIC GASTRIC BANDING    . LEFT AND RIGHT HEART CATHETERIZATION WITH CORONARY ANGIOGRAM N/A 05/07/2014   Procedure: LEFT AND RIGHT HEART CATHETERIZATION WITH CORONARY ANGIOGRAM;  Surgeon: [name] Martinique, MD;  Location: [facility] CATH LAB;  Service: Cardiovascular;  Laterality: N/A;  . ROTATOR CUFF REPAIR     both  . UVULOPALATOPHARYNGOPLASTY    . VASECTOMY         Home Medications    Prior to Admission medications   Medication Sig Start Date End Date Taking? Authorizing Provider  acetaminophen (TYLENOL) 500 MG tablet Take 500 mg by mouth as needed.    Historical Provider, MD  albuterol (PROAIR HFA) 108 (90  Base) MCG/ACT inhaler 2 puffs every 4 hours as needed only  if your can't catch your breath Patient taking differently: 2 puffs every 4 hours as needed for wheezing and shortness of breath 04/22/15   Tanda Rockers, MD  atenolol (TENORMIN) 25 MG tablet Take 25 mg by mouth every morning.     Historical Provider, MD  atorvastatin (LIPITOR) 10 MG tablet Take 10 mg by mouth daily.    Historical Provider, MD  azithromycin (ZITHROMAX) 250 MG tablet Take 2 pills today then one a day for 4  additional days Patient not taking: Reported on 03/09/2016 01/03/16   Marshell Garfinkel, MD  budesonide-formoterol (SYMBICORT) 160-4.5 MCG/ACT inhaler Inhale 2 puffs into the lungs 2 (two) times daily. 08/13/15   Chesley Mires, MD  celecoxib (CELEBREX) 200 MG capsule Take 200 mg by mouth 2 (two) times daily.    Historical Provider, MD  fluticasone (FLONASE) 50 MCG/ACT nasal spray Place into both nostrils daily as needed (nasal congestion).    Historical Provider, MD  fluticasone (FLOVENT HFA) 110 MCG/ACT inhaler Inhale into the lungs. 03/09/14   Historical Provider, MD  furosemide (LASIX) 80 MG tablet Take 1 tablet (80 mg total) by mouth daily. Take 80 mg in the morning and 40 mg in the afternoon. 05/07/14   [name] Martinique, MD  KLOR-CON M20 20 MEQ tablet TAKE 2 TABLETS (40 MEQ TOTAL) BY MOUTH 2 (TWO) TIMES DAILY. 07/12/15   [name] Martinique, MD  losartan (COZAAR) 25 MG tablet Take 1 tablet (25 mg total) by mouth daily. 10/22/15 01/20/16  [name] Martinique, MD  metFORMIN (GLUCOPHAGE) 500 MG tablet Take 500 mg by mouth daily with breakfast.  06/05/14   Historical Provider, MD  montelukast (SINGULAIR) 10 MG tablet Take 10 mg by mouth at bedtime.    Historical Provider, MD  nitroGLYCERIN (NITROSTAT) 0.4 MG SL tablet Place 1 tablet (0.4 mg total) under the tongue every 5 (five) minutes as needed for chest pain. 10/01/14   [name] Martinique, MD  omeprazole (PRILOSEC) 20 MG capsule Take 20 mg by mouth daily before breakfast.    Historical Provider, MD    OXYGEN Inhale into the lungs. Use CPAP at bedtime    Historical Provider, MD  rivaroxaban (XARELTO) 20 MG TABS tablet Take 20 mg by mouth.    Historical Provider, MD  sildenafil (VIAGRA) 100 MG tablet Take 100 mg by mouth. 05/23/10   Historical Provider, MD  spironolactone (ALDACTONE) 25 MG tablet Take 25 mg by mouth at bedtime.  12/27/12   Historical Provider, MD  tamsulosin (FLOMAX) 0.4 MG CAPS Take 0.4 mg by mouth daily.    Historical Provider, MD  tiZANidine (ZANAFLEX) 4 MG tablet Take 4 mg by mouth 3 (three) times daily as needed. 07/18/15   Historical Provider, MD  topiramate (TOPAMAX) 25 MG capsule Take 2 tablets by mouth twice a day    Historical Provider, MD  traMADol (ULTRAM) 50 MG tablet Take 50 mg by mouth. 09/22/14   Historical Provider, MD    Family History Family History  Problem Relation Age of Onset  . Heart disease Mother   . Diabetes Mother   . Other Mother     stent placement  . Emphysema Father 96  . Heart attack Sister     Social History Social History  Substance Use Topics  . Smoking status: Former Smoker    Packs/day: 1.50    Years: 30.00    Quit date: 01/24/1992  . Smokeless tobacco: Never Used     Comment: Stopped in 1983  . Alcohol use No     Allergies   Morphine; Adhesive [tape]; and Latex   Review of Systems Review of Systems  Constitutional: Negative for fever.  Musculoskeletal: Positive for back pain.  Neurological: Negative for weakness and numbness.  All other systems reviewed and are negative.    Physical Exam Updated Vital Signs BP 118/73   Pulse (!) 50   Temp 98.2 F (36.8 C) (Oral)   Resp 20   SpO2 98%   Physical Exam  Constitutional:  He is oriented to person, place, and time. He appears well-developed and well-nourished.  HENT:  Head: Normocephalic and atraumatic.  Eyes: Conjunctivae are normal. Right eye exhibits no discharge. Left eye exhibits no discharge. No scleral icterus.  Neck: Neck supple.  Cardiovascular: Normal  rate.   Pulmonary/Chest: Effort normal.  Lungs sounds are clear, no wheezing or rales, no difficulty breathing  Abdominal: He exhibits no distension.  Soft nontender abdomen, no distention, no guarding, no masses, obesity present  Musculoskeletal: Normal range of motion.  Reproducible pain with movement of legs and palpation.  Mild tenderness over the right lower back, no spinal tenderness  Neurological: He is alert and oriented to person, place, and time.  Significant pain in the right lower back, this is worsened significantly with trying to straight leg raise especially with left lower extremity straight leg raise. Normal sensation to the bilateral lower extremities, normal strength at the knees hips and ankles.  Skin: Skin is warm and dry.  No rashes  Psychiatric: He has a normal mood and affect.  Nursing note and vitals reviewed.    ED Treatments / Results  DIAGNOSTIC STUDIES:  Oxygen Saturation is 97% on RA, normal by my interpretation.    COORDINATION OF CARE:  12:57 PM Discussed treatment plan with pt at bedside including pain medication and MRI and pt agreed to plan.  Labs (all labs ordered are listed, but only abnormal results are displayed) Labs Reviewed  CBC WITH DIFFERENTIAL/PLATELET - Abnormal; Notable for the following:       Result Value   Hemoglobin 12.8 (*)    Platelets 144 (*)    All other components within normal limits  COMPREHENSIVE METABOLIC PANEL - Abnormal; Notable for the following:    Glucose, Bld 132 (*)    Total Bilirubin 1.3 (*)    All other components within normal limits  PROTIME-INR - Abnormal; Notable for the following:    Prothrombin Time 19.0 (*)    All other components within normal limits    Radiology Mr Lumbar Spine W Wo Contrast  Result Date: 03/15/2016 CLINICAL DATA:  Low back pain.  RIGHT leg pain. EXAM: MRI LUMBAR SPINE WITHOUT AND WITH CONTRAST TECHNIQUE: Multiplanar and multiecho pulse sequences of the lumbar spine were  obtained without and with intravenous contrast. CONTRAST:  45mL MULTIHANCE GADOBENATE DIMEGLUMINE 529 MG/ML IV SOLN COMPARISON:  Lumbar myelogram 03/27/2013. FINDINGS: Segmentation:  Standard Alignment: 2 mm anterolisthesis L4-5. Compensatory retrolisthesis L3-4 and L1-2 of a similar degree. Vertebrae: No worrisome osseous lesion. Pseudarthrosis at L4-5 with endplate reactive change, subchondral enhancement of the endplates, as well as anterior L4-5 discal enhancement. Conus medullaris: Extends to the L2-L3 level and appears normal. Paraspinal and other soft tissues: Atrophic change of the paravertebral musculature at L3 and below. Disc levels: L1-L2: Severe stenosis related to 2 mm retrolisthesis, osseous spurring, and central protrusion. Posterior element hypertrophy. BILATERAL subarticular zone narrowing likely affects the L2 nerve roots. L2-L3: Unremarkable disc space. Prior posterior decompression. Regrowth of posterior elements contributes to moderate stenosis. BILATERAL subarticular zone narrowing could affect either L3 nerve root. L3-L4: Prior posterior decompression with partial regrowth of posterior elements. Annular bulging with osseous spurring. 2 mm retrolisthesis. Mild stenosis. Subarticular zone narrowing not clearly compressive. BILATERAL foraminal narrowing due to bony overgrowth could affect either L3 nerve root. L4-L5: 2 mm anterolisthesis. Pseudarthrosis with cage subsidence. Adequate posterior decompression. LEFT-sided foraminal narrowing could affect the LEFT L4 nerve root. Residual subarticular zone and foraminal zone narrowing is suspected affecting the RIGHT  L4 and L5 nerve roots. L5-S1: Good preservation of interspace height. Annular bulge. Facet arthropathy. Foraminal protrusion on the RIGHT. Correlate clinically for RIGHT L5 nerve root impingement. Compared with prior CT myelogram from 2015, a similar appearance is noted. IMPRESSION: Pseudarthrosis L4-5.  No solid interbody bridging.   Cage subsidence. Potentially symptomatic residual neural impingement at L4-5 on the RIGHT. Correlate clinically for RIGHT L4 and/or RIGHT L5 nerve root radicular symptoms. Multifactorial foraminal narrowing at L3-4 on the RIGHT and at L5-S1 on the RIGHT, related to bony overgrowth and/or disc material, could additionally affect the RIGHT L3 or RIGHT L5 nerve roots. Severe multifactorial stenosis at L1-2 related to osseous spurring and retrolisthesis in addition of posterior element hypertrophy. Either L2 nerve root could be affected. Electronically Signed   By: Staci Righter M.D.   On: 03/15/2016 15:39    Procedures Procedures (including critical care time)  Medications Ordered in ED Medications  dexamethasone (DECADRON) injection 10 mg (not administered)  methocarbamol (ROBAXIN) tablet 500 mg (not administered)  HYDROmorphone (DILAUDID) injection 1 mg (1 mg Intravenous Given 03/15/16 1318)  gadobenate dimeglumine (MULTIHANCE) injection 20 mL (20 mLs Intravenous Contrast Given 03/15/16 1509)  HYDROmorphone (DILAUDID) injection 0.5 mg (0.5 mg Intravenous Given 03/15/16 1537)    Initial Impression / Assessment and Plan / ED Course  I have reviewed the triage vital signs and the nursing notes.  Pertinent labs & imaging results that were available during my care of the patient were reviewed by me and considered in my medical decision making (see chart for details).  Has had acutely worsening back pain - there is no focal numbness or weakness but pain is significantly worsened - concern for pathology given hx of prostate CA and worsening pani - he has had 5 surgical operations on the lumbar spine in the past and follows with Dr. Sherley Bounds - dilaudid given and improvement recorded,  MRI has been done and shows:   IMPRESSION: Pseudarthrosis L4-5.  No solid interbody bridging.  Cage subsidence. Potentially symptomatic residual neural impingement at L4-5 on the RIGHT. Correlate clinically for RIGHT L4  and/or RIGHT L5 nerve root radicular symptoms. Multifactorial foraminal narrowing at L3-4 on the RIGHT and at L5-S1 on the RIGHT, related to bony overgrowth and/or disc material, could additionally affect the RIGHT L3 or RIGHT L5 nerve roots. Severe multifactorial stenosis at L1-2 related to osseous spurring and retrolisthesis in addition of posterior element hypertrophy. Either L2 nerve root could be affected.   Will discuss with Dr. Ronnald Ramp for management recommendations  I discussed the care with Dr. Sherley Bounds, he recommends pain control, muscle relaxer, steroids, home with follow-up next week in his office. At this time the patient does not have any focal neurologic deficits. He is in agreement with the plan. Both he and his wife are able to repeat the instructions for return including signs of focal neurologic abnormalities  Final Clinical Impressions(s) / ED Diagnoses   Final diagnoses:  Back pain   New Prescriptions New Prescriptions   No medications on file   I personally performed the services described in this documentation, which was scribed in my presence. The recorded information has been reviewed and is accurate.     Noemi Chapel, MD 03/15/16 [phone]

## 2016-03-16 ENCOUNTER — Ambulatory Visit: Payer: Medicare Other | Admitting: Pulmonary Disease

## 2016-03-17 DIAGNOSIS — Z6841 Body Mass Index (BMI) 40.0 and over, adult: Secondary | ICD-10-CM | POA: Diagnosis not present

## 2016-03-17 DIAGNOSIS — I10 Essential (primary) hypertension: Secondary | ICD-10-CM | POA: Diagnosis not present

## 2016-03-17 DIAGNOSIS — M545 Low back pain: Secondary | ICD-10-CM | POA: Diagnosis not present

## 2016-03-20 ENCOUNTER — Ambulatory Visit (INDEPENDENT_AMBULATORY_CARE_PROVIDER_SITE_OTHER): Payer: Medicare Other | Admitting: Physical Medicine and Rehabilitation

## 2016-03-20 DIAGNOSIS — M545 Low back pain: Secondary | ICD-10-CM | POA: Diagnosis not present

## 2016-03-20 DIAGNOSIS — Z6841 Body Mass Index (BMI) 40.0 and over, adult: Secondary | ICD-10-CM | POA: Diagnosis not present

## 2016-03-22 ENCOUNTER — Other Ambulatory Visit: Payer: Self-pay | Admitting: Cardiology

## 2016-03-24 NOTE — Progress Notes (Signed)
[facility] [employer] PREP Weekly Session   Patient Details  Name: Carlos Dawson MRN: [id number] Date of Birth: 1940-06-19 Age: 76 y.o. PCP: Henrine Screws, MD  Vitals:   03/24/16 0953  Weight: 277 lb 3.2 oz (125.7 kg)        [facility] [employer] Weekly seesion - 03/24/16 0900      Weekly Session   Topic Discussed Healthy eating tips   Minutes exercised this week 15 minutes  cardio   Classes attended to date 3      Fun things you did since last meeting:"birthday party" Things you are grateful for:"God and family" Nutrition celebrations for the week:"Try to eat right" Barriers/struggles:"back problems & one of our dogs had major surgery"  Vanita Ingles 03/24/2016, 9:54 AM

## 2016-03-27 ENCOUNTER — Telehealth: Payer: Self-pay | Admitting: Cardiology

## 2016-03-27 ENCOUNTER — Other Ambulatory Visit: Payer: Self-pay

## 2016-03-27 NOTE — Telephone Encounter (Signed)
Returned call to discuss recommendations. Pt advised no need to interrupt Xarelto and to continue as prescribed - he voiced understanding and thanks for call back.

## 2016-03-27 NOTE — Telephone Encounter (Signed)
Doesn't need to hold for a hip injection- just for spine  Peter Martinique MD, [facility]

## 2016-03-27 NOTE — Telephone Encounter (Signed)
Pt of Dr. Martinique On Xarelto for A Fib.  Returned call. Pt getting injection in hip on Wednesday. Notes Dr. Ernestina Patches didn't give him specific instructions to hold Xarelto, but that he usually holds for 2 days prior to procedure. Pt aware I'll seek review by Dr. Martinique to verify [place] for hold & will call back w instruction.

## 2016-03-27 NOTE — Telephone Encounter (Signed)
New Message   Pt c/o medication issue:  1. Name of Medication: rivaroxaban (XARELTO) 20 MG TABS tablet  2. How are you currently taking this medication (dosage and times per day)? 20mg  1 time  3. Are you having a reaction (difficulty breathing--STAT)? no  4. What is your medication issue? Pt taking a blood thinner and states he is getting an injection on Wednesday and would liek to know how many days he needs to be off.

## 2016-03-29 ENCOUNTER — Ambulatory Visit (INDEPENDENT_AMBULATORY_CARE_PROVIDER_SITE_OTHER): Payer: Medicare Other | Admitting: Physical Medicine and Rehabilitation

## 2016-03-29 ENCOUNTER — Encounter (INDEPENDENT_AMBULATORY_CARE_PROVIDER_SITE_OTHER): Payer: Self-pay | Admitting: Physical Medicine and Rehabilitation

## 2016-03-29 VITALS — BP 122/75 | HR 39

## 2016-03-29 DIAGNOSIS — M25551 Pain in right hip: Secondary | ICD-10-CM | POA: Diagnosis not present

## 2016-03-29 DIAGNOSIS — M961 Postlaminectomy syndrome, not elsewhere classified: Secondary | ICD-10-CM | POA: Diagnosis not present

## 2016-03-29 DIAGNOSIS — M5416 Radiculopathy, lumbar region: Secondary | ICD-10-CM

## 2016-03-29 NOTE — Progress Notes (Signed)
Carlos Dawson - 76 y.o. male MRN [id number]  Date of birth: 03-29-1940  Office Visit Note: Visit Date: 03/29/2016 PCP: Henrine Screws, MD Referred by: Josetta Huddle, MD  Subjective: Chief Complaint  Patient presents with  . Right Hip - Pain  . Lower Back - Pain  . Spine - Pain   HPI: Carlos Dawson is a 76 year old gentleman that I had seen in the remote past but not recently. He reports chronic Low back, right hip,and groin pain for several months. The brief history is that he was referred to [place] for diagnostic and hopefully therapeutic hip injection after seeing Dr. Durward Fortes. Unfortunately or maybe fortunately my fluoroscopic unit went down for repairs and the patient had to be rescheduled. His pain was so severe that he had an episode of trying to get out of the shower or his hip just basically locked up and he couldn't do anything. He went to the emergency room and those notes are reviewed today. He was evaluated there and they did consult Dr. Sherley Bounds who operated on his back in the past. A new MRI of the lumbar spine was performed during that visit and this is reviewed below as well. He followed up with Dr. Ronnald Ramp in the office and an epidural injection at L1 was performed by Dr. Maryjean Ka. This injection was around a week ago and  He reports hehas had "right much relief with it." Can't sleep on the right side. Burning into right thigh with standing any length of time. Denies numbness and tingling. He feels overall he is doing quite well and is very happy with his results at this point. He did say he spoke with Dr. Ronnald Ramp who said to keep the appointment here to try to figure out if there is any other component to his pain. He has had no focal weakness or bowel or bladder changes. He is having minimal groin pain at this point and can really do most of the things he would like to do. Looking at Dr. Rudene Anda notes, the patient had pain only at really at extreme end ranges of motion of the  hip.    Review of Systems  Constitutional: Negative for chills, fever, malaise/fatigue and weight loss.  HENT: Negative for hearing loss and sinus pain.   Eyes: Negative for blurred vision, double vision and photophobia.  Respiratory: Negative for cough and shortness of breath.   Cardiovascular: Negative for chest pain, palpitations and leg swelling.  Gastrointestinal: Negative for abdominal pain, nausea and vomiting.  Genitourinary: Negative for flank pain.  Musculoskeletal: Positive for back pain and joint pain. Negative for myalgias.  Skin: Negative for itching and rash.  Neurological: Negative for tremors, focal weakness and weakness.  Endo/Heme/Allergies: Negative.   Psychiatric/Behavioral: Negative for depression.  All other systems reviewed and are negative.  Otherwise per HPI.  Assessment & Plan: Visit Diagnoses:  1. Pain in right hip   2. Lumbar radiculopathy   3. Post laminectomy syndrome     Plan: Findings:  Chronic history of low back pain and stenosis with prior lumbar laminectomy and fusion by Dr. Ronnald Ramp. Now MRI with pretty severe stenosis at the L1-2 level. He has had successful epidural injection at this level with quite a bit relief from his symptoms that radiate into the groin. He has essentially count of a double crush type situation with the L1 nerve root can obviously refer to the groin. He also has some degenerative changes on x-ray of the hips. On exam  today what I arranged his hip he gets a little bit of pain over the greater trochanter but not in the groin. He does have tenderness over the greater trochanter and his symptoms right now that her leftover seem to be a little bit more trochanteric bursitis although they're not severe at all. I think at this point I had a long talk with the patient and his wife that we really don't need to perform a hip injection because it would tell us anything. He is doing so well I tried to explain that at length. I did tell him I  would get a note to Dr. Ronnald Ramp about this. If his symptoms flareup and they do think is more hip related and be happy to do the injection or I'm sure Dr. Maryjean Ka could do that as well. Conversely if his bursitis symptoms persist one of the physicians sitting and at this point could perform a greater trochanteric injection. We did not do that today based on the fact that he was doing fairly well. He'll continue to follow up with Dr. Ronnald Ramp and I think they have a scheduled follow-up. This seems clearly to be more spine related. I spent more than 20 minutes speaking face-to-face with the patient with 50% of the time in counseling.    Meds & Orders: No orders of the defined types were placed in this encounter.  No orders of the defined types were placed in this encounter.   Follow-up: Return if symptoms worsen or fail to improve.   Procedures: No procedures performed  No notes on file   Clinical History: Lumbar spine MRI 03/15/2016  IMPRESSION: Pseudarthrosis L4-5.  No solid interbody bridging.  Cage subsidence.  Potentially symptomatic residual neural impingement at L4-5 on the RIGHT. Correlate clinically for RIGHT L4 and/or RIGHT L5 nerve root radicular symptoms.  Multifactorial foraminal narrowing at L3-4 on the RIGHT and at L5-S1 on the RIGHT, related to bony overgrowth and/or disc material, could additionally affect the RIGHT L3 or RIGHT L5 nerve roots.  Severe multifactorial stenosis at L1-2 related to osseous spurring and retrolisthesis in addition of posterior element hypertrophy. Either L2 nerve root could be affected.   AP x-ray of the pelvis dated 03/09/2016 shows degenerative changes of both hips probably right more than left with superior osteophytes and some medial joint line narrowing.  He reports that he quit smoking about 24 years ago. He has a 45.00 pack-year smoking history. He has never used smokeless tobacco. No results for input(s): HGBA1C, LABURIC in the last 8760  hours.  Objective:  VS:  HT:    WT:   BMI:     BP:122/75  HR:(!) 39bpm  TEMP: ( )  RESP:  Physical Exam  Constitutional: He is oriented to person, place, and time. He appears well-developed and well-nourished. No distress.  Obese  HENT:  Head: Normocephalic and atraumatic.  Eyes: Conjunctivae are normal. Pupils are equal, round, and reactive to light.  Neck: Normal range of motion. Neck supple.  Cardiovascular: Regular rhythm and intact distal pulses.   Pulmonary/Chest: Effort normal. No respiratory distress.  Musculoskeletal:  Patient has concordant low back pain with extension of the lumbar spine. He is slow to rise from a seated position. He is a little bit unsteady on his feet in general. He does feel weakness at times. On exam however he has good strength with hip flexion and dorsiflexion plantarflexion EHL. He has no clonus bilaterally. Hip rotation is actually fairly full bilaterally with mild lateral  pain with rotation internally but no groin pain. He does have some tenderness over the greater trochanter.  Neurological: He is alert and oriented to person, place, and time. He exhibits normal muscle tone.  Skin: Skin is warm and dry. No rash noted. No erythema.  Psychiatric: He has a normal mood and affect.  Nursing note and vitals reviewed.   Ortho Exam Imaging: No results found.  Past Medical/Family/Surgical/Social History: Medications & Allergies reviewed per EMR Patient Active Problem List   Diagnosis Date Noted  . Pain in right hip 03/29/2016  . Lumbar radiculopathy 03/29/2016  . Post laminectomy syndrome 03/29/2016  . Severe obesity (BMI >= 40) ([place]) 04/26/2015  . Asthma with acute exacerbation 04/22/2015  . CAP (community acquired pneumonia) 08/12/2013  . Encounter for therapeutic drug monitoring 03/03/2013  . Chest pain at rest 01/06/2013  . Chronic diastolic heart failure ([place]) 12/26/2012  . HLD (hyperlipidemia) 12/26/2012  . Allergic rhinitis 12/24/2012  .  Long term (current) use of anticoagulants 08/29/2012  . Lapband APL May 2009 10/25/2011  . Hypoxemia 12/08/2010  . Dyspnea 12/08/2010  . Hypertension   . Obesities, morbid ([place])   . Pulmonary embolism ([place])   . Anticoagulant long-term use   . CAD (coronary artery disease)   . Thoracic aortic aneurysm ([place])   . OA (osteoarthritis)   . LVH (left ventricular hypertrophy)   . Paroxysmal atrial fibrillation (H[facility])   . Mild persistent asthma, well controlled 10/21/2008  . OSA (obstructive sleep apnea) 08/06/2008   Past Medical History:  Diagnosis Date  . Anticoagulant long-term use   . Aortic root enlargement ([place])   . Ascending aortic aneurysm [facility])    recent scan in October 2012 showing no change; followed by Dr. Servando Snare  . ASCVD (arteriosclerotic cardiovascular disease)    Prior BMS to the 2nd OM in September 2012; with repeat cath in October showing patency  . CAD (coronary artery disease)    a. s/p BMS to 2nd OM in Sept 2012; b. LexiScan Myoview (12/2012):  Inf infarct; bowel and motion artifact make study difficult to interpret; no ischemia; not gated; Low Risk  . CHF (congestive heart failure) ([place])    no recent issues 10/13/14  . Colonic polyp   . Contact lens/glasses fitting   . Diabetes mellitus without complication (HCC)    metphormin, average 154  . Diastolic dysfunction   . Generalized headaches    neck stenosis  . GERD (gastroesophageal reflux disease)   . Hearing loss   . Hemorrhoids   . Hypertension   . IBS (irritable bowel syndrome)   . LVH (left ventricular hypertrophy)   . Mild intermittent asthma   . OA (osteoarthritis)   . Obesities, morbid ([place])   . OSA (obstructive sleep apnea)    PSG 03/30/97 AHI 21, BPAP 13/9  . PAF (paroxysmal atrial fibrillation) ([place])    treated with Coumadin  . Prostate CA [facility])    Oncologist  DR. Daralene [facility] 09/24/14, undetermined tx   . Pulmonary embolism ([place])    2008  . Sleep apnea   . SOB (shortness of breath)     on excertion  . Thoracic aortic aneurysm (HCC)    Aortic Size Index=     5.0    /Body surface area is 2.43 meters squared. = 2.05  < 2.75 cm/m2      4% risk per year 2.75 to 4.25          8% risk per year > 4.25 cm/m2  20% risk per year   Stable aneurysmal dilation of the ascending aorta with maximum AP diameter of 4.8 cm. Stable area of narrowing of the proximal most portion of the descending aorta measuring 2 cm., previously identified as an area of coarctation. No evidence of aortic dissection.  Coronary artery disease.  Normal appearance of the lungs.   Electronically Signed   By: Fidela Salisbury M.D.   On: 10/01/2014 08:50     Family History  Problem Relation Age of Onset  . Heart disease Mother   . Diabetes Mother   . Other Mother     stent placement  . Emphysema Father 73  . Heart attack Sister    Past Surgical History:  Procedure Laterality Date  . ACHILLES TENDON REPAIR    . APPENDECTOMY    . CARDIAC CATHETERIZATION  2006  . CARDIAC CATHETERIZATION  October 2012   Stent patent  . CERVICAL SPINE SURGERY  06/02/2010   lower back and neck  . CHOLECYSTECTOMY    . COLONOSCOPY WITH PROPOFOL N/A 12/29/2014   Procedure: COLONOSCOPY WITH PROPOFOL;  Surgeon: Garlan Fair, MD;  Location: [facility] ENDOSCOPY;  Service: Endoscopy;  Laterality: N/A;  . CORONARY STENT PLACEMENT  Sept 2012   2nd OM with BMS  . EYE SURGERY     bilateral cataract  . HEMORROIDECTOMY    . LAMINECTOMY  05/30/2012   L 4 L5  . LAPAROSCOPIC GASTRIC BANDING    . LEFT AND RIGHT HEART CATHETERIZATION WITH CORONARY ANGIOGRAM N/A 05/07/2014   Procedure: LEFT AND RIGHT HEART CATHETERIZATION WITH CORONARY ANGIOGRAM;  Surgeon: [name] Martinique, MD;  Location: [facility] CATH LAB;  Service: Cardiovascular;  Laterality: N/A;  . ROTATOR CUFF REPAIR     both  . UVULOPALATOPHARYNGOPLASTY    . VASECTOMY     Social History   Occupational History  . Retired from [employer]   Social History Main Topics  . Smoking status:  Former Smoker    Packs/day: 1.50    Years: 30.00    Quit date: 01/24/1992  . Smokeless tobacco: Never Used     Comment: Stopped in 1983  . Alcohol use No  . Drug use: No  . Sexual activity: Yes

## 2016-03-30 ENCOUNTER — Encounter: Payer: Self-pay | Admitting: Adult Health

## 2016-03-30 ENCOUNTER — Ambulatory Visit (INDEPENDENT_AMBULATORY_CARE_PROVIDER_SITE_OTHER): Payer: Medicare Other | Admitting: Adult Health

## 2016-03-30 ENCOUNTER — Ambulatory Visit (INDEPENDENT_AMBULATORY_CARE_PROVIDER_SITE_OTHER)
Admission: RE | Admit: 2016-03-30 | Discharge: 2016-03-30 | Disposition: A | Discharge status: Home / Self Care | Payer: Medicare Other | Source: Ambulatory Visit | Attending: Adult Health | Admitting: Adult Health

## 2016-03-30 DIAGNOSIS — B370 Candidal stomatitis: Secondary | ICD-10-CM | POA: Insufficient documentation

## 2016-03-30 DIAGNOSIS — J4541 Moderate persistent asthma with (acute) exacerbation: Secondary | ICD-10-CM

## 2016-03-30 DIAGNOSIS — R05 Cough: Secondary | ICD-10-CM | POA: Diagnosis not present

## 2016-03-30 MED ORDER — AZELASTINE-FLUTICASONE 137-50 MCG/ACT NA SUSP
2.0000 | Freq: Every day | NASAL | 0 refills | Status: DC
Start: 2016-03-30 — End: 2016-05-03

## 2016-03-30 MED ORDER — HYDROCODONE-HOMATROPINE 5-1.5 MG/5ML PO SYRP: 5.0000 mL | ORAL_SOLUTION | Freq: Four times a day (QID) | ORAL | 0 refills | Status: DC | PRN

## 2016-03-30 MED ORDER — CLOTRIMAZOLE 10 MG MT TROC: 10.0000 mg | Freq: Every day | OROMUCOSAL | 0 refills | Status: DC

## 2016-03-30 MED ORDER — AZELASTINE-FLUTICASONE 137-50 MCG/ACT NA SUSP: 2.0000 | Freq: Every day | NASAL | 3 refills | Status: DC

## 2016-03-30 MED ORDER — LEVALBUTEROL HCL 0.63 MG/3ML IN NEBU
0.6300 mg | INHALATION_SOLUTION | Freq: Once | RESPIRATORY_TRACT | Status: AC
  Administered 2016-03-30: 0.63 mg via RESPIRATORY_TRACT

## 2016-03-30 NOTE — Patient Instructions (Addendum)
Begin Zyrtec 10mg  At bedtime  .  Saline nasal rinses As needed   Begin Dysmista 2 puffs daily .  Delsym 2 tsp Twice daily  As needed  Cough/congestion .  Hydromet 1 tsp every 6h As needed  Cough , may make you sleepy  Mycelex troche five times a day for 1 week.  Chest xray today .  Follow up with Dr. Halford Chessman  In 3 months and As needed   Please contact office for sooner follow up if symptoms do not improve or worsen or seek emergency care

## 2016-03-30 NOTE — Addendum Note (Signed)
Addended by: Parke Poisson E on: 03/30/2016 05:06 PM   Modules accepted: Orders

## 2016-03-30 NOTE — Addendum Note (Signed)
Addended by: Parke Poisson E on: 03/30/2016 04:00 PM   Modules accepted: Orders

## 2016-03-30 NOTE — Assessment & Plan Note (Signed)
mycelex x 1 week  Rinse after inhaler

## 2016-03-30 NOTE — Progress Notes (Signed)
@Patient  ID: Carlos Dawson, male    DOB: May 19, 1940, 76 y.o.   MRN: [id number]  Chief Complaint  Patient presents with  . Acute Visit    Asthma     Referring provider: Josetta Huddle, MD  HPI: 76 year old male for mild persistent asthma History of obstructive sleep apnea, A. fib, and PE  03/30/2016 Acute OV : Asthma  Patient presents for an acute office visit He complains of 1 month of cough and congestion . Initially had sinus congestion , drainage with thick mucus . Went to urgent care , tx w/ abx . Sx got some better with decreased congestion but dry hacking cough has persistented. Has chest tightness, drainage , hoarseness, and tickling in throat. Not taking anything for cough .  Remains on Symbicort .  No fever, chest pain , orthopnea, edema,   Has had low back pain , found spinal stenosis , DDD/DJD. tx w/ prednisone pack.  Has ov with ortho in 1 week.        Allergies  Allergen Reactions  . Morphine Itching  . Adhesive [Tape] Itching and Rash  . Latex Itching and Rash    Bandaids    Immunization History  Administered Date(s) Administered  . Influenza Split 12/08/2010, 10/23/2012  . Influenza Whole 09/23/2009, 10/23/2011  . Influenza, High Dose Seasonal PF 10/19/2015  . Influenza,inj,Quad PF,36+ Mos 10/23/2013, 10/24/2014  . Pneumococcal Conjugate-13 10/23/2013  . Pneumococcal Polysaccharide-23 11/22/2010  . Zoster 10/24/2014    Past Medical History:  Diagnosis Date  . Anticoagulant long-term use   . Aortic root enlargement ([place])   . Ascending aortic aneurysm [facility])    recent scan in October 2012 showing no change; followed by Dr. Servando Snare  . ASCVD (arteriosclerotic cardiovascular disease)    Prior BMS to the 2nd OM in September 2012; with repeat cath in October showing patency  . CAD (coronary artery disease)    a. s/p BMS to 2nd OM in Sept 2012; b. LexiScan Myoview (12/2012):  Inf infarct; bowel and motion artifact make study difficult to interpret; no  ischemia; not gated; Low Risk  . CHF (congestive heart failure) ([place])    no recent issues 10/13/14  . Colonic polyp   . Contact lens/glasses fitting   . Diabetes mellitus without complication (HCC)    metphormin, average 154  . Diastolic dysfunction   . Generalized headaches    neck stenosis  . GERD (gastroesophageal reflux disease)   . Hearing loss   . Hemorrhoids   . Hypertension   . IBS (irritable bowel syndrome)   . LVH (left ventricular hypertrophy)   . Mild intermittent asthma   . OA (osteoarthritis)   . Obesities, morbid ([place])   . OSA (obstructive sleep apnea)    PSG 03/30/97 AHI 21, BPAP 13/9  . PAF (paroxysmal atrial fibrillation) ([place])    treated with Coumadin  . Prostate CA [facility])    Oncologist  DR. [facility] 09/24/[facility], undetermined tx   . Pulmonary embolism ([place])    2008  . Sleep apnea   . SOB (shortness of breath)    on excertion  . Thoracic aortic aneurysm (HCC)    Aortic Size Index=     5.0    /Body surface area is 2.43 meters squared. = 2.05  < 2.75 cm/m2      4% risk per year 2.75 to 4.25          8% risk per year > 4.25 cm/m2    20% risk  per year   Stable aneurysmal dilation of the ascending aorta with maximum AP diameter of 4.8 cm. Stable area of narrowing of the proximal most portion of the descending aorta measuring 2 cm., previously identified as an area of coarctation. No evidence of aortic dissection.  Coronary artery disease.  Normal appearance of the lungs.   Electronically Signed   By: Fidela Salisbury M.D.   On: 10/01/2014 08:50      Tobacco History: History  Smoking Status  . Former Smoker  . Packs/day: 1.50  . Years: 30.00  . Quit date: 01/24/1992  Smokeless Tobacco  . Never Used    Comment: Stopped in 1983   Counseling given: Not Answered   Outpatient Encounter Prescriptions as of 03/30/2016  Medication Sig  . acetaminophen (TYLENOL) 500 MG tablet Take 500 mg by mouth as needed.  Marland Kitchen albuterol (PROAIR HFA) 108 (90 Base) MCG/ACT  inhaler 2 puffs every 4 hours as needed only  if your can't catch your breath (Patient taking differently: 2 puffs every 4 hours as needed for wheezing and shortness of breath)  . atenolol (TENORMIN) 25 MG tablet Take 25 mg by mouth every morning.   Marland Kitchen atorvastatin (LIPITOR) 10 MG tablet Take 10 mg by mouth daily.  . budesonide-formoterol (SYMBICORT) 160-4.5 MCG/ACT inhaler Inhale 2 puffs into the lungs 2 (two) times daily.  . celecoxib (CELEBREX) 200 MG capsule Take 200 mg by mouth 2 (two) times daily.  . fluticasone (FLOVENT HFA) 110 MCG/ACT inhaler Inhale into the lungs.  . furosemide (LASIX) 80 MG tablet Take 1 tablet (80 mg total) by mouth daily. Take 80 mg in the morning and 40 mg in the afternoon.  . metFORMIN (GLUCOPHAGE) 500 MG tablet Take 500 mg by mouth daily with breakfast.   . methocarbamol (ROBAXIN) 500 MG tablet Take 1 tablet (500 mg total) by mouth 2 (two) times daily as needed for muscle spasms.  . montelukast (SINGULAIR) 10 MG tablet Take 10 mg by mouth at bedtime.  . nitroGLYCERIN (NITROSTAT) 0.4 MG SL tablet Place 1 tablet (0.4 mg total) under the tongue every 5 (five) minutes as needed for chest pain.  Marland Kitchen omeprazole (PRILOSEC) 20 MG capsule Take 20 mg by mouth daily before breakfast.  . OXYGEN Inhale into the lungs. Use CPAP at bedtime  . potassium chloride SA (KLOR-CON M20) 20 MEQ tablet Take 2 tablets (40 mEq total) by mouth 2 (two) times daily.  . rivaroxaban (XARELTO) 20 MG TABS tablet Take 20 mg by mouth.  . sildenafil (VIAGRA) 100 MG tablet Take 100 mg by mouth.  . spironolactone (ALDACTONE) 25 MG tablet Take 25 mg by mouth at bedtime.   . tamsulosin (FLOMAX) 0.4 MG CAPS Take 0.4 mg by mouth daily.  Marland Kitchen tiZANidine (ZANAFLEX) 4 MG tablet Take 4 mg by mouth 3 (three) times daily as needed.  . topiramate (TOPAMAX) 25 MG capsule Take 2 tablets by mouth twice a day  . traMADol (ULTRAM) 50 MG tablet Take 50 mg by mouth.  Marland Kitchen azithromycin (ZITHROMAX) 250 MG tablet Take 2 pills  today then one a day for 4 additional days (Patient not taking: Reported on 03/30/2016)  . clotrimazole (MYCELEX) 10 MG troche Take 1 tablet (10 mg total) by mouth 5 (five) times daily.  . fluticasone (FLONASE) 50 MCG/ACT nasal spray Place into both nostrils daily as needed (nasal congestion).  Marland Kitchen losartan (COZAAR) 25 MG tablet Take 1 tablet (25 mg total) by mouth daily.  Marland Kitchen oxyCODONE-acetaminophen (PERCOCET) 7.5-325 MG tablet Take  1 tablet by mouth every 6 (six) hours as needed for severe pain. (Patient not taking: Reported on 03/30/2016)  . predniSONE (DELTASONE) 20 MG tablet Take 2 tablets (40 mg total) by mouth daily. (Patient not taking: Reported on 03/30/2016)   No facility-administered encounter medications on file as of 03/30/2016.      Review of Systems  Constitutional:   No  weight loss, night sweats,  Fevers, chills,  +fatigue, or  lassitude.  HEENT:   No headaches,  Difficulty swallowing,  Tooth/dental problems, or   +Sore throat,                No sneezing, itching, ear ache,  +nasal congestion, post nasal drip,   CV:  No chest pain,  Orthopnea, PND, swelling in lower extremities, anasarca, dizziness, palpitations, syncope.   GI  No heartburn, indigestion, abdominal pain, nausea, vomiting, diarrhea, change in bowel habits, loss of appetite, bloody stools.   Resp:   No chest wall deformity  Skin: no rash or lesions.  GU: no dysuria, change in color of urine, no urgency or frequency.  No flank pain, no hematuria   MS:  No joint pain or swelling.  No decreased range of motion.  No back pain.    Physical Exam  BP 108/72 (BP Location: Right Arm, Patient Position: Sitting, Cuff Size: Large)   Pulse (!) 50   Ht 5\' 6"  (1.676 m)   Wt 274 lb 12.8 oz (124.6 kg)   SpO2 96%   BMI 44.35 kg/m   GEN: A/Ox3; pleasant , NAD, obese    HEENT:  [place]/AT,  EACs-clear, TMs-wnl, NOSE-clear drainage THROAT-white patches , , no lesions, no postnasal drip or exudate noted.   NECK:  Supple w/  fair ROM; no JVD; normal carotid impulses w/o bruits; no thyromegaly or nodules palpated; no lymphadenopathy.    RESP  Clear  P & A; w/o, wheezes/ rales/ or rhonchi. no accessory muscle use, no dullness to percussion  CARD:  RRR, no m/r/g, no peripheral edema, pulses intact, no cyanosis or clubbing.  GI:   Soft & nt; nml bowel sounds; no organomegaly or masses detected.   Musco: Warm bil, no deformities or joint swelling noted.   Neuro: alert, no focal deficits noted.    Skin: Warm, no lesions or rashes   Lab Results:  CBC    Component Value Date/Time   WBC 6.9 03/15/2016 1315   RBC 4.38 03/15/2016 1315   HGB 12.8 (L) 03/15/2016 1315   HCT 39.8 03/15/2016 1315   PLT 144 (L) 03/15/2016 1315   MCV 90.9 03/15/2016 1315   MCH 29.2 03/15/2016 1315   MCHC 32.2 03/15/2016 1315   RDW 14.2 03/15/2016 1315   LYMPHSABS 1.8 03/15/2016 1315   MONOABS 0.5 03/15/2016 1315   EOSABS 0.1 03/15/2016 1315   BASOSABS 0.0 03/15/2016 1315    BMET    Component Value Date/Time   NA 140 03/15/2016 1315   K 3.8 03/15/2016 1315   CL 105 03/15/2016 1315   CO2 25 03/15/2016 1315   GLUCOSE 132 (H) 03/15/2016 1315   BUN 20 03/15/2016 1315   CREATININE 1.09 03/15/2016 1315   CREATININE 1.11 10/14/2015 0929   CALCIUM 9.2 03/15/2016 1315   GFRNONAA >60 03/15/2016 1315   GFRAA >60 03/15/2016 1315    BNP No results found for: BNP  ProBNP    Component Value Date/Time   PROBNP 96.0 05/11/2015 1443    Imaging: Mr Lumbar Spine W Wo Contrast  Result  Date: 03/15/2016 CLINICAL DATA:  Low back pain.  RIGHT leg pain. EXAM: MRI LUMBAR SPINE WITHOUT AND WITH CONTRAST TECHNIQUE: Multiplanar and multiecho pulse sequences of the lumbar spine were obtained without and with intravenous contrast. CONTRAST:  58mL MULTIHANCE GADOBENATE DIMEGLUMINE 529 MG/ML IV SOLN COMPARISON:  Lumbar myelogram 03/27/2013. FINDINGS: Segmentation:  Standard Alignment: 2 mm anterolisthesis L4-5. Compensatory retrolisthesis L3-4  and L1-2 of a similar degree. Vertebrae: No worrisome osseous lesion. Pseudarthrosis at L4-5 with endplate reactive change, subchondral enhancement of the endplates, as well as anterior L4-5 discal enhancement. Conus medullaris: Extends to the L2-L3 level and appears normal. Paraspinal and other soft tissues: Atrophic change of the paravertebral musculature at L3 and below. Disc levels: L1-L2: Severe stenosis related to 2 mm retrolisthesis, osseous spurring, and central protrusion. Posterior element hypertrophy. BILATERAL subarticular zone narrowing likely affects the L2 nerve roots. L2-L3: Unremarkable disc space. Prior posterior decompression. Regrowth of posterior elements contributes to moderate stenosis. BILATERAL subarticular zone narrowing could affect either L3 nerve root. L3-L4: Prior posterior decompression with partial regrowth of posterior elements. Annular bulging with osseous spurring. 2 mm retrolisthesis. Mild stenosis. Subarticular zone narrowing not clearly compressive. BILATERAL foraminal narrowing due to bony overgrowth could affect either L3 nerve root. L4-L5: 2 mm anterolisthesis. Pseudarthrosis with cage subsidence. Adequate posterior decompression. LEFT-sided foraminal narrowing could affect the LEFT L4 nerve root. Residual subarticular zone and foraminal zone narrowing is suspected affecting the RIGHT L4 and L5 nerve roots. L5-S1: Good preservation of interspace height. Annular bulge. Facet arthropathy. Foraminal protrusion on the RIGHT. Correlate clinically for RIGHT L5 nerve root impingement. Compared with prior CT myelogram from 2015, a similar appearance is noted. IMPRESSION: Pseudarthrosis L4-5.  No solid interbody bridging.  Cage subsidence. Potentially symptomatic residual neural impingement at L4-5 on the RIGHT. Correlate clinically for RIGHT L4 and/or RIGHT L5 nerve root radicular symptoms. Multifactorial foraminal narrowing at L3-4 on the RIGHT and at L5-S1 on the RIGHT, related to  bony overgrowth and/or disc material, could additionally affect the RIGHT L3 or RIGHT L5 nerve roots. Severe multifactorial stenosis at L1-2 related to osseous spurring and retrolisthesis in addition of posterior element hypertrophy. Either L2 nerve root could be affected. Electronically Signed   By: Staci Righter M.D.   On: 03/15/2016 15:39   Xr Lumbar Spine 2-3 Views  Result Date: 03/09/2016 Films of the lumbar spine obtained in the AP and lateral projection. There appears to be an old fusion between L4 and L5. There is complete collapse of L5 on S1. There is some calcification along the abdominal  aorta but without aneurysmal dilatation. Degenerative changes are seen throughout the lumbar spine. Decreased disc height at L1-2 with slight retrolisthesis of L1 on 2. Reversal of the normal lumbar lordosis    Assessment & Plan:   Asthma with acute exacerbation Mild flare with AB w/ post nasal drainage  Check cxr  xopenex .neb x 1   Plan  Patient Instructions  Begin Zyrtec 10mg  At bedtime  .  Saline nasal rinses As needed   Begin Dysmista 2 puffs daily .  Delsym 2 tsp Twice daily  As needed  Cough/congestion .  Hydromet 1 tsp every 6h As needed  Cough , may make you sleepy  Mycelex troche five times a day for 1 week.  Chest xray today .  Follow up with Dr. Halford Chessman  In 3 months and As needed   Please contact office for sooner follow up if symptoms do not improve or worsen or seek emergency  care       Oral candidiasis mycelex x 1 week  Rinse after inhaler       Rexene Edison, NP 03/30/2016

## 2016-03-30 NOTE — Assessment & Plan Note (Signed)
Mild flare with AB w/ post nasal drainage  Check cxr  xopenex .neb x 1   Plan  Patient Instructions  Begin Zyrtec 10mg  At bedtime  .  Saline nasal rinses As needed   Begin Dysmista 2 puffs daily .  Delsym 2 tsp Twice daily  As needed  Cough/congestion .  Hydromet 1 tsp every 6h As needed  Cough , may make you sleepy  Mycelex troche five times a day for 1 week.  Chest xray today .  Follow up with Dr. Halford Chessman  In 3 months and As needed   Please contact office for sooner follow up if symptoms do not improve or worsen or seek emergency care

## 2016-03-31 NOTE — Progress Notes (Signed)
I have reviewed and agree with assessment/plan.  Chesley Mires, MD [facility] Pulmonary/Critical Care 03/31/2016, 4:22 PM Pager:  [phone]

## 2016-03-31 NOTE — Progress Notes (Signed)
Called spoke with patient, advised of cxr results / recs as stated by TP.  Pt verbalized his understanding and denied any questions. 

## 2016-03-31 NOTE — Progress Notes (Signed)
[facility] [employer] PREP Weekly Session   Patient Details  Name: Carlos Dawson MRN: [id number] Date of Birth: 11/22/1940 Age: 76 y.o. PCP: Henrine Screws, MD  Vitals:   03/31/16 1124  Weight: 274 lb 6.4 oz (124.5 kg)        [employer] Weekly seesion - 03/31/16 1100      Weekly Session   Topic Discussed Restaurant Eating   Minutes exercised this week 0 minutes  was sick this past week   Classes attended to date 4      Things you are grateful for: "family" Vanita Ingles 03/31/2016, 11:24 AM

## 2016-04-03 DIAGNOSIS — Z4789 Encounter for other orthopedic aftercare: Secondary | ICD-10-CM | POA: Diagnosis not present

## 2016-04-05 NOTE — Progress Notes (Signed)
[facility] [employer] PREP Weekly Session   Patient Details  Name: Carlos Dawson MRN: [id number] Date of Birth: [birth year] Age: 76 y.o. PCP: Henrine Screws, MD  There were no vitals filed for this visit.      [employer] Weekly seesion - 04/05/16 1300      Weekly Session   Minutes exercised this week 20 minutes  cardio   Classes attended to date 5      Fun things you did since last meeting:"senior luncheon at church" Nutrition celebration for the week:"trying to eat better" Things you are grateful for:"God, family, life" Barriers:"back problems and breathing"  Vanita Ingles 04/05/2016, 1:32 PM

## 2016-04-06 NOTE — Progress Notes (Signed)
Thank you, Josph Macho. I figured it was mostly his back, but before we got too aggressive I wanted you guys to see him. I hope he can avoid more surgery.

## 2016-04-10 ENCOUNTER — Other Ambulatory Visit: Payer: Self-pay | Admitting: Neurological Surgery

## 2016-04-10 DIAGNOSIS — I10 Essential (primary) hypertension: Secondary | ICD-10-CM | POA: Diagnosis not present

## 2016-04-10 DIAGNOSIS — Z6841 Body Mass Index (BMI) 40.0 and over, adult: Secondary | ICD-10-CM | POA: Diagnosis not present

## 2016-04-10 DIAGNOSIS — M48062 Spinal stenosis, lumbar region with neurogenic claudication: Secondary | ICD-10-CM | POA: Diagnosis not present

## 2016-04-14 NOTE — Progress Notes (Signed)
[facility] [employer] PREP Weekly Session   Patient Details  Name: SHAYDON LEASE MRN: [id number] Date of Birth: 16-Jan-1941 Age: 76 y.o. PCP: Henrine Screws, MD  Vitals:   04/14/16 1148  Weight: 272 lb 3.2 oz (123.5 kg)        [employer] Weekly seesion - 04/14/16 1100      Weekly Session   Topic Discussed Importance of resistance training   Minutes exercised this week 60 minutes  20cardio/40strength   Classes attended to date 6     Things you are grateful for:"family, friends" Nutrition celebrations for the week:"cutting back on food size" Barriers:" Back problems and breathing"  Vanita Ingles 04/14/2016, 11:50 AM

## 2016-04-14 NOTE — Progress Notes (Signed)
[place] Report   Patient Details  Name: Carlos Dawson MRN: [id number] Date of Birth: 24-Jan-1940 Age: 76 y.o. PCP: Henrine Screws, MD  Vitals:   02/28/16 1403  BP: 114/62  Resp: 18  SpO2: 98%  Weight: 282 lb 9.6 oz (128.2 kg)  Height: 5\' 6"  (1.676 m)      Past Medical History:  Diagnosis Date  . Anticoagulant long-term use   . Aortic root enlargement ([place])   . Ascending aortic aneurysm [facility])    recent scan in October 2012 showing no change; followed by Dr. Servando Snare  . ASCVD (arteriosclerotic cardiovascular disease)    Prior BMS to the 2nd OM in September 2012; with repeat cath in October showing patency  . CAD (coronary artery disease)    a. s/p BMS to 2nd OM in Sept 2012; b. LexiScan Myoview (12/2012):  Inf infarct; bowel and motion artifact make study difficult to interpret; no ischemia; not gated; Low Risk  . CHF (congestive heart failure) ([place])    no recent issues 10/13/14  . Colonic polyp   . Contact lens/glasses fitting   . Diabetes mellitus without complication (HCC)    metphormin, average 154  . Diastolic dysfunction   . Generalized headaches    neck stenosis  . GERD (gastroesophageal reflux disease)   . Hearing loss   . Hemorrhoids   . Hypertension   . IBS (irritable bowel syndrome)   . LVH (left ventricular hypertrophy)   . Mild intermittent asthma   . OA (osteoarthritis)   . Obesities, morbid ([place])   . OSA (obstructive sleep apnea)    PSG 03/30/97 AHI 21, BPAP 13/9  . PAF (paroxysmal atrial fibrillation) ([place])    treated with Coumadin  . Prostate CA [facility])    Oncologist  DR. [facility] 09/24/[facility], undetermined tx   . Pulmonary embolism ([place])    2008  . Sleep apnea   . SOB (shortness of breath)    on excertion  . Thoracic aortic aneurysm (HCC)    Aortic Size Index=     5.0    /Body surface area is 2.43 meters squared. = 2.05  < 2.75 cm/m2      4% risk per year 2.75 to 4.25          8% risk per year > 4.25 cm/m2    20%  risk per year   Stable aneurysmal dilation of the ascending aorta with maximum AP diameter of 4.8 cm. Stable area of narrowing of the proximal most portion of the descending aorta measuring 2 cm., previously identified as an area of coarctation. No evidence of aortic dissection.  Coronary artery disease.  Normal appearance of the lungs.   Electronically Signed   By: Fidela Salisbury M.D.   On: 10/01/2014 08:50     Past Surgical History:  Procedure Laterality Date  . ACHILLES TENDON REPAIR    . APPENDECTOMY    . CARDIAC CATHETERIZATION  2006  . CARDIAC CATHETERIZATION  October 2012   Stent patent  . CERVICAL SPINE SURGERY  06/02/2010   lower back and neck  . CHOLECYSTECTOMY    . COLONOSCOPY WITH PROPOFOL N/A 12/29/2014   Procedure: COLONOSCOPY WITH PROPOFOL;  Surgeon: Garlan Fair, MD;  Location: [facility] ENDOSCOPY;  Service: Endoscopy;  Laterality: N/A;  . CORONARY STENT PLACEMENT  Sept 2012   2nd OM with BMS  . EYE SURGERY     bilateral cataract  . HEMORROIDECTOMY    . LAMINECTOMY  05/30/2012  L 4 L5  . LAPAROSCOPIC GASTRIC BANDING    . LEFT AND RIGHT HEART CATHETERIZATION WITH CORONARY ANGIOGRAM N/A 05/07/2014   Procedure: LEFT AND RIGHT HEART CATHETERIZATION WITH CORONARY ANGIOGRAM;  Surgeon: [name] Martinique, MD;  Location: [facility] CATH LAB;  Service: Cardiovascular;  Laterality: N/A;  . ROTATOR CUFF REPAIR     both  . UVULOPALATOPHARYNGOPLASTY    . VASECTOMY     History  Smoking Status  . Former Smoker  . Packs/day: 1.50  . Years: 30.00  . Quit date: 01/24/1992  Smokeless Tobacco  . Never Used    Comment: Stopped in 1983    This documentation was done today d/t the chart being unsigned.  Also initially documented under the [facility] which is inaccurate.  The documentation was actually done at the [facility] but the undersigned logged in under the incorrect cost center.    Vanita Ingles 04/14/2016, 11:53 AM

## 2016-04-21 ENCOUNTER — Encounter (HOSPITAL_COMMUNITY)
Admission: RE | Admit: 2016-04-21 | Discharge: 2016-04-21 | Disposition: A | Discharge status: Home / Self Care | Payer: Medicare Other | Source: Ambulatory Visit | Attending: Neurological Surgery | Admitting: Neurological Surgery

## 2016-04-21 ENCOUNTER — Encounter (HOSPITAL_COMMUNITY): Payer: Self-pay

## 2016-04-21 DIAGNOSIS — I517 Cardiomegaly: Secondary | ICD-10-CM | POA: Insufficient documentation

## 2016-04-21 DIAGNOSIS — Z01810 Encounter for preprocedural cardiovascular examination: Secondary | ICD-10-CM | POA: Insufficient documentation

## 2016-04-21 DIAGNOSIS — Z7901 Long term (current) use of anticoagulants: Secondary | ICD-10-CM | POA: Insufficient documentation

## 2016-04-21 DIAGNOSIS — R001 Bradycardia, unspecified: Secondary | ICD-10-CM | POA: Insufficient documentation

## 2016-04-21 DIAGNOSIS — I10 Essential (primary) hypertension: Secondary | ICD-10-CM | POA: Diagnosis not present

## 2016-04-21 DIAGNOSIS — M1990 Unspecified osteoarthritis, unspecified site: Secondary | ICD-10-CM | POA: Insufficient documentation

## 2016-04-21 DIAGNOSIS — Z01812 Encounter for preprocedural laboratory examination: Secondary | ICD-10-CM | POA: Diagnosis not present

## 2016-04-21 DIAGNOSIS — I2510 Atherosclerotic heart disease of native coronary artery without angina pectoris: Secondary | ICD-10-CM | POA: Diagnosis not present

## 2016-04-21 DIAGNOSIS — E119 Type 2 diabetes mellitus without complications: Secondary | ICD-10-CM | POA: Diagnosis not present

## 2016-04-21 DIAGNOSIS — I2699 Other pulmonary embolism without acute cor pulmonale: Secondary | ICD-10-CM | POA: Insufficient documentation

## 2016-04-21 DIAGNOSIS — I480 Paroxysmal atrial fibrillation: Secondary | ICD-10-CM | POA: Diagnosis not present

## 2016-04-21 HISTORY — DX: Unspecified hearing loss, unspecified ear: H91.90

## 2016-04-21 HISTORY — DX: Pneumonia, unspecified organism: J18.9

## 2016-04-21 LAB — BASIC METABOLIC PANEL
Anion gap: 7 (ref 5–15)
BUN: 19 mg/dL (ref 6–20)
CO2: 26 mmol/L (ref 22–32)
Calcium: 9 mg/dL (ref 8.9–10.3)
Chloride: 106 mmol/L (ref 101–111)
Creatinine, Ser: 1.03 mg/dL (ref 0.61–1.24)
GFR calc Af Amer: >60 mL/min (ref >60)
GFR calc non Af Amer: >60 mL/min (ref >60)
Glucose, Bld: 163 mg/dL — ABNORMAL HIGH (ref 65–99)
Potassium: 3.9 mmol/L (ref 3.5–5.1)
Sodium: 139 mmol/L (ref 135–145)

## 2016-04-21 LAB — CBC WITH DIFFERENTIAL/PLATELET
Basophils Absolute: 0 10*3/uL (ref 0.0–0.1)
Basophils Relative: 0 %
Eosinophils Absolute: 0 10*3/uL (ref 0.0–0.7)
Eosinophils Relative: 1 %
HCT: 39.5 % (ref 39.0–52.0)
Hemoglobin: 12.8 g/dL — ABNORMAL LOW (ref 13.0–17.0)
Lymphocytes Relative: 23 %
Lymphs Abs: 1.4 10*3/uL (ref 0.7–4.0)
MCH: 29.6 pg (ref 26.0–34.0)
MCHC: 32.4 g/dL (ref 30.0–36.0)
MCV: 91.2 fL (ref 78.0–100.0)
Monocytes Absolute: 0.6 10*3/uL (ref 0.1–1.0)
Monocytes Relative: 10 %
Neutro Abs: 4.2 10*3/uL (ref 1.7–7.7)
Neutrophils Relative %: 66 %
Platelets: 135 10*3/uL — ABNORMAL LOW (ref 150–400)
RBC: 4.33 MIL/uL (ref 4.22–5.81)
RDW: 15.1 % (ref 11.5–15.5)
WBC: 6.3 10*3/uL (ref 4.0–10.5)

## 2016-04-21 LAB — SURGICAL PCR SCREEN
MRSA, PCR: NEGATIVE
Staphylococcus aureus: NEGATIVE

## 2016-04-21 LAB — PROTIME-INR
INR: 1.56
Prothrombin Time: 18.8 seconds — ABNORMAL HIGH (ref 11.4–15.2)

## 2016-04-21 LAB — GLUCOSE, CAPILLARY: Glucose-Capillary: 158 mg/dL — ABNORMAL HIGH (ref 65–99)

## 2016-04-21 NOTE — Pre-Procedure Instructions (Addendum)
Carlos Dawson  04/21/2016      [id number] [place], [place] - [address] AT [address] [place] [address] Phone: [phone] Fax: [phone]  [facility], [address] AT [place] [address] [place] [address] [place] [address] Phone: [phone] Fax: [phone]    Your procedure is scheduled on    Thursday  05/04/16  Report to [facility] Admitting at 1100 A.M.  Call this number if you have problems the morning of surgery:  [phone]   Remember:  Do not eat food or drink liquids after midnight Wednesday.   Take these medicines the morning of surgery with A SIP OF WATER   ALBUTEROL INHALER, ATENOLOL (TENORMIN), SYMBICORT IF NEEDED , OMEPRAZOLE, OXYCODONE OR TRAMADOL  IF NEEDED, TOPIRAMATE (TOPAMAX)        STOP  7 DAYS PRIOR TO SURGERY  ASPIRIN, IBUPROFEN/ ADVIL/ MOTRIN, GOODY POWDERS, HERBAL MEDICINES, CELEBREX/ CELECOXIB)    How to Manage Your Diabetes Before and After Surgery  Why is it important to control my blood sugar before and after surgery? . Improving blood sugar levels before and after surgery helps healing and can limit problems. . A way of improving blood sugar control is eating a healthy diet by: o  Eating less sugar and carbohydrates o  Increasing activity/exercise o  Talking with your doctor about reaching your blood sugar goals . High blood sugars (greater than 180 mg/dL) can raise your risk of infections and slow your recovery, so you will need to focus on controlling your diabetes during the weeks before surgery. . Make sure that the doctor who takes care of your diabetes knows about your planned surgery including the date and location.  How do I manage my blood sugar before surgery? . Check your blood sugar at least 4 times a day, starting 2 days before surgery, to make sure that the level is not too high or  low. o Check your blood sugar the morning of your surgery when you wake up and every 2 hours until you get to the Short Stay unit. o  . If your blood sugar is less than 70 mg/dL, you will need to treat for low blood sugar: o Do not take insulin. o Treat a low blood sugar (less than 70 mg/dL) with  cup of clear juice (cranberry or apple), 4 glucose tablets, OR glucose gel. o  o Recheck blood sugar in 15 minutes after treatment (to make sure it is greater than 70 mg/dL). If your blood sugar is not greater than 70 mg/dL on recheck, call [phone] for further instructions. . Report your blood sugar to the short stay nurse when you get to Short Stay.  . If you are admitted to the hospital after surgery: o Your blood sugar will be checked by the staff and you will probably be given insulin after surgery (instead of oral diabetes medicines) to make sure you have good blood sugar levels. o The goal for blood sugar control after surgery is 80-180 mg/dL.   WHAT DO I DO ABOUT MY DIABETES MEDICATION?   Marland Kitchen Do not take oral diabetes medicines (pills) the morning of surgery.              Do not wear jewelry - no rings or watches.  Do not wear lotions, colognes or deoderant.  Men may shave face and neck.   Do not bring valuables to the hospital.  [facility] is not responsible for any belongings or valuables.  Contacts, dentures or bridgework may not be worn into surgery.  Leave your suitcase in the car.  After surgery it may be brought to your room.  For patients admitted to the hospital, discharge time will be determined by your treatment team.   Please read over the following fact sheets that you were given. Pain Booklet, MRSA Information and Surgical Site Infection Prevention

## 2016-04-21 NOTE — Progress Notes (Signed)
[facility] PREP Weekly Session   Patient Details  Name: Carlos Dawson MRN: [id number] Date of Birth: 1941-01-04 Age: 76 y.o. PCP: Henrine Screws, MD  Vitals:   04/21/16 1406  Weight: 278 lb (126.1 kg)        [facility] [employer] Weekly seesion - 04/21/16 1400      Weekly Session   Topic Discussed Expectations and non-scale victories   Classes attended to date 7     Fun things you did since last meeting:"go to movie" Things you are grateful for:"wife & family"  Vanita Ingles 04/21/2016, 2:06 PM

## 2016-04-21 NOTE — Progress Notes (Signed)
PCP is Dr. America Brown LOV 02/2016  He also manages the diabetes meds (borderline) Hardly ever checks his blood sugars, but did state that it may run between 149-159 in the am. He can't remember last A1C, which in epic was 6.7 in 2008.  Cardio is Dr. P Martinique  LOV 11/2015  Has been instructed to hold xarelto 2-5 days prior to surgery.   Jones said 5, Martinique said 2 days. Denies any current heart problems or complaints Sleep Study he believes was done in 2016. uses mask, doesn't know settings, but Dr. Halford Chessman regulates that.

## 2016-04-22 LAB — HEMOGLOBIN A1C
Hgb A1c MFr Bld: 7.6 % — ABNORMAL HIGH (ref 4.8–5.6)
Mean Plasma Glucose: 171 mg/dL

## 2016-04-24 NOTE — Progress Notes (Addendum)
Anesthesia Chart Review:  Pt is a 76 year old male scheduled for T12-L1, L1-L2 laminectomy and foraminotomy on 05/04/2016 with Sherley Bounds, M.D.  - PCP is Josetta Huddle, MD - Pulmonologist is Chesley Mires, MD - Cardiologist is Peter Martinique, MD, last office visit 12/03/15  PMH includes: CAD (BMS to OM 2 2012), CHF, thoracic aortic aneurysm, PAF, HTN, DM, PE, OSA, prostate cancer, GERD. Former smoker. BMI 45. S/p lumbar fusion 05/30/12.  Medications include: Albuterol, atenolol, Lipitor, Symbicort, Lasix, losartan, metformin, Prilosec proximal, potassium, xarelto, sildenafil, spironolactone. Per Dr. Ronnald Ramp' office, pt to hold xarelto 4 days prior to surgery, pt aware.   Preoperative labs reviewed.   - HbA1c 7.6, glucose 163 - PT 18.8. Will recheck DOS  CXR 03/30/16:  1. Chronic bronchitic changes. 2. Thoracic spondylosis.  EKG 04/21/16: Sinus bradycardia with 1st degree A-V block with PACs. Nonspecific T wave abnormality. ST change in V3 is new  Echo 04/27/14:  - Left ventricle: The cavity size was normal. Systolic function wasnormal. The estimated ejection fraction was in the range of 55%to 60%. Wall motion was normal; there were no regional wallmotion abnormalities. There was an increased relative contribution of atrial contraction to ventricular filling. Doppler parameters are consistent with abnormal left ventricularrelaxation (grade 1 diastolic dysfunction). - Aortic valve: Poorly visualized. Valve area (VTI): 1.53 cm^2.Valve area (Vmax): 1.6 cm^2. Valve area (Vmean): 1.88 cm^2. - Aorta: Aortic root dimension: 47 mm (ED). - Aortic root: The aortic root was mild to moderately dilated. - Tricuspid valve: There was trivial regurgitation.  Cardiac cath 05/07/14:  1. No obstructive CAD. The OM stent is widely patent.  2. Mild pulmonary HTN with elevated LV filling pressures.   Willeen Cass, FNP-BC [facility]/Anesthesiology Phone: [phone] 04/24/2016 4:50  PM  Addendum:  Pt saw Rexene Edison, NP in pulmonology 04/28/16 for acute illness, tmax 102, green drainage from nose. Tx for sinusitis. I notified Lorriane Shire in Dr. Ronnald Ramp' office of acute illness.   Willeen Cass, FNP-BC [facility]/Anesthesiology Phone: [phone] 04/28/2016 4:37 PM

## 2016-04-26 ENCOUNTER — Telehealth: Payer: Self-pay

## 2016-04-26 NOTE — Telephone Encounter (Signed)
That if fine to change to Flovent 2 puffs daily and Astelin 2 puffs daily .

## 2016-04-26 NOTE — Telephone Encounter (Signed)
Received a fax from [place] stating they provided a temporary supply of Dymista but it is not on the pt's formulary list. Did you want to change it to the 2 separate medications or proceed with PA?

## 2016-04-27 ENCOUNTER — Telehealth: Payer: Self-pay

## 2016-04-27 MED ORDER — FLUTICASONE PROPIONATE 50 MCG/ACT NA SUSP: 2.0000 | Freq: Every day | NASAL | 2 refills | Status: DC

## 2016-04-27 MED ORDER — AZELASTINE HCL 0.1 % NA SOLN: 2.0000 | Freq: Every day | NASAL | 2 refills | Status: DC

## 2016-04-27 NOTE — Telephone Encounter (Signed)
Received surgical clearance from [facility].Dr.Jordan advised ok to hold Xarelto 48 hours prior to surgery.Form faxed to fax # [phone].

## 2016-04-27 NOTE — Telephone Encounter (Signed)
flonase and astelin--verified with Janett Billow. Rx sent to preferred pharmacy. Pt aware and voiced his understanding. Nothing further needed

## 2016-04-28 ENCOUNTER — Encounter: Payer: Self-pay | Admitting: Adult Health

## 2016-04-28 ENCOUNTER — Ambulatory Visit (INDEPENDENT_AMBULATORY_CARE_PROVIDER_SITE_OTHER): Payer: Medicare Other | Admitting: Adult Health

## 2016-04-28 DIAGNOSIS — J4530 Mild persistent asthma, uncomplicated: Secondary | ICD-10-CM | POA: Diagnosis not present

## 2016-04-28 DIAGNOSIS — J309 Allergic rhinitis, unspecified: Secondary | ICD-10-CM | POA: Diagnosis not present

## 2016-04-28 MED ORDER — DOXYCYCLINE HYCLATE 100 MG PO TABS: 100.0000 mg | ORAL_TABLET | Freq: Two times a day (BID) | ORAL | 0 refills | Status: DC

## 2016-04-28 MED ORDER — PREDNISONE 10 MG PO TABS: ORAL_TABLET | ORAL | 0 refills | Status: DC

## 2016-04-28 NOTE — Assessment & Plan Note (Signed)
Flare ? sinusiits   Plan  Patient Instructions  Extend Doxycycline for 7 days .  Mucinex DM Twice daily  As needed  Cough/congestion  Prednisone 40mg  daily for 3 days and stop.  Fluids and rest  Please contact office for sooner follow up if symptoms do not improve or worsen or seek emergency care  Follow up Dr. Halford Chessman  As planned and As needed

## 2016-04-28 NOTE — Patient Instructions (Addendum)
Extend Doxycycline for 7 days .  Mucinex DM Twice daily  As needed  Cough/congestion  Prednisone 40mg  daily for 3 days and stop.  Fluids and rest  Please contact office for sooner follow up if symptoms do not improve or worsen or seek emergency care  Follow up Dr. Halford Chessman  As planned and As needed

## 2016-04-28 NOTE — Assessment & Plan Note (Signed)
Flare with bronchitis   Plan  Patient Instructions  Extend Doxycycline for 7 days .  Mucinex DM Twice daily  As needed  Cough/congestion  Prednisone 40mg  daily for 3 days and stop.  Fluids and rest  Please contact office for sooner follow up if symptoms do not improve or worsen or seek emergency care  Follow up Dr. Halford Chessman  As planned and As needed

## 2016-04-28 NOTE — Progress Notes (Signed)
@Patient  ID: Carlos Dawson, male    DOB: 1940/04/03, 76 y.o.   MRN: [id number]  Chief Complaint  Patient presents with  . Acute Visit    Asthma    Referring provider: Josetta Huddle, MD  HPI: 76 year old male for mild persistent asthma History of obstructive sleep apnea, A. fib, and PE  04/28/2016 Acute OV  Pt presents for an acute office visit. Complains of 1 week of cough , congestion , drainage and sinus pressure. Has thick green sinus and chest congestion . No sign wheezing . No body aches.  Had initial fever for 1 days ,tmax 102. Taken saline rinse and zyrtec. Tooks some left over doxycycline for last 2 days . Wife is sick today .  Having back surgery next week.     Allergies  Allergen Reactions  . Morphine Itching  . Adhesive [Tape] Itching and Rash  . Latex Itching and Rash    Bandaids    Immunization History  Administered Date(s) Administered  . Influenza Split 12/08/2010, 10/23/2012  . Influenza Whole 09/23/2009, 10/23/2011  . Influenza, High Dose Seasonal PF 10/19/2015  . Influenza,inj,Quad PF,36+ Mos 10/23/2013, 10/24/2014  . Pneumococcal Conjugate-13 10/23/2013  . Pneumococcal Polysaccharide-23 11/22/2010  . Zoster 10/24/2014    Past Medical History:  Diagnosis Date  . Anticoagulant long-term use   . Aortic root enlargement ([place])   . Ascending aortic aneurysm [facility])    recent scan in October 2012 showing no change; followed by Dr. Servando Snare  . ASCVD (arteriosclerotic cardiovascular disease)    Prior BMS to the 2nd OM in September 2012; with repeat cath in October showing patency  . CAD (coronary artery disease)    a. s/p BMS to 2nd OM in Sept 2012; b. LexiScan Myoview (12/2012):  Inf infarct; bowel and motion artifact make study difficult to interpret; no ischemia; not gated; Low Risk  . CHF (congestive heart failure) ([place])    no recent issues 10/13/14  . Colonic polyp   . Contact lens/glasses fitting   . Diabetes mellitus without complication (HCC)      metphormin, average 154 dx 2017  . Diastolic dysfunction   . Generalized headaches    neck stenosis  . GERD (gastroesophageal reflux disease)   . Hearing loss   . Hearing loss    more so on left  . Hemorrhoids   . Hypertension   . IBS (irritable bowel syndrome)   . LVH (left ventricular hypertrophy)   . Mild intermittent asthma   . OA (osteoarthritis)   . Obesities, morbid ([place])   . OSA (obstructive sleep apnea)    PSG 03/30/97 AHI 21, BPAP 13/9  . PAF (paroxysmal atrial fibrillation) ([place])    treated with Coumadin  . Pneumonia   . Prostate CA [facility])    Oncologist  DR. Daralene Milch [facility] dx 09/24/14, undetermined tx   prostate  . Pulmonary embolism ([place])    2008  . Sleep apnea   . SOB (shortness of breath)    on excertion  . Thoracic aortic aneurysm (HCC)    Aortic Size Index=     5.0    /Body surface area is 2.43 meters squared. = 2.05  < 2.75 cm/m2      4% risk per year 2.75 to 4.25          8% risk per year > 4.25 cm/m2    20% risk per year   Stable aneurysmal dilation of the ascending aorta with maximum AP diameter of 4.8 cm. Stable  area of narrowing of the proximal most portion of the descending aorta measuring 2 cm., previously identified as an area of coarctation. No evidence of aortic dissection.  Coronary artery disease.  Normal appearance of the lungs.   Electronically Signed   By: Fidela Salisbury M.D.   On: 10/01/2014 08:50      Tobacco History: History  Smoking Status  . Former Smoker  . Packs/day: 1.50  . Years: 30.00  . Quit date: 01/24/1992  Smokeless Tobacco  . Never Used    Comment: Stopped in 1983   Counseling given: Not Answered   Outpatient Encounter Prescriptions as of 04/28/2016  Medication Sig  . acetaminophen (TYLENOL) 500 MG tablet Take 1,000 mg by mouth every 8 (eight) hours as needed for mild pain.   Marland Kitchen albuterol (PROAIR HFA) 108 (90 Base) MCG/ACT inhaler 2 puffs every 4 hours as needed only  if your can't catch your breath (Patient taking  differently: Inhale 2 puffs into the lungs every 4 (four) hours as needed for wheezing or shortness of breath. )  . atenolol (TENORMIN) 25 MG tablet Take 25 mg by mouth every morning.   Marland Kitchen atorvastatin (LIPITOR) 10 MG tablet Take 10 mg by mouth daily at 6 PM.   . azelastine (ASTELIN) 0.1 % nasal spray Place 2 sprays into both nostrils daily. Use in each nostril as directed  . Azelastine-Fluticasone 137-50 MCG/ACT SUSP Place 2 sprays into both nostrils daily.  Marland Kitchen azithromycin (ZITHROMAX) 250 MG tablet Take 2 pills today then one a day for 4 additional days  . budesonide-formoterol (SYMBICORT) 160-4.5 MCG/ACT inhaler Inhale 2 puffs into the lungs 2 (two) times daily. (Patient taking differently: Inhale 2 puffs into the lungs 2 (two) times daily as needed (SHORTNESS OF BREATH). )  . celecoxib (CELEBREX) 200 MG capsule Take 200 mg by mouth 2 (two) times daily.  . Cholecalciferol (VITAMIN D-3) 5000 units TABS Take 5,000 Units by mouth daily.  . clotrimazole (MYCELEX) 10 MG troche Take 1 tablet (10 mg total) by mouth 5 (five) times daily.  Marland Kitchen dicyclomine (BENTYL) 20 MG tablet Take 20 mg by mouth 2 (two) times daily as needed for spasms.  . fluticasone (FLONASE) 50 MCG/ACT nasal spray Place 2 sprays into both nostrils daily.  . furosemide (LASIX) 80 MG tablet Take 1 tablet (80 mg total) by mouth daily. Take 80 mg in the morning and 40 mg in the afternoon. (Patient taking differently: Take 40-80 mg by mouth daily. Take 80 mg in the morning and 40 mg in the afternoon.)  . HYDROcodone-homatropine (HYDROMET) 5-1.5 MG/5ML syrup Take 5 mLs by mouth every 6 (six) hours as needed.  . metFORMIN (GLUCOPHAGE-XR) 500 MG 24 hr tablet Take 500 mg by mouth daily before supper.  . methocarbamol (ROBAXIN) 500 MG tablet Take 1 tablet (500 mg total) by mouth 2 (two) times daily as needed for muscle spasms.  . montelukast (SINGULAIR) 10 MG tablet Take 10 mg by mouth at bedtime.  . nitroGLYCERIN (NITROSTAT) 0.4 MG SL tablet Place  1 tablet (0.4 mg total) under the tongue every 5 (five) minutes as needed for chest pain.  Marland Kitchen omeprazole (PRILOSEC) 20 MG capsule Take 20 mg by mouth daily before breakfast.  . oxyCODONE-acetaminophen (PERCOCET) 7.5-325 MG tablet Take 1 tablet by mouth every 6 (six) hours as needed for severe pain.  . potassium chloride SA (KLOR-CON M20) 20 MEQ tablet Take 2 tablets (40 mEq total) by mouth 2 (two) times daily.  . predniSONE (DELTASONE) 20 MG  tablet Take 2 tablets (40 mg total) by mouth daily.  . Pyridoxine HCl (B-6 PO) Take 2 tablets by mouth daily.  . rivaroxaban (XARELTO) 20 MG TABS tablet Take 20 mg by mouth daily with supper.   . sildenafil (VIAGRA) 100 MG tablet Take 100 mg by mouth as needed for erectile dysfunction.   . Skin Protectants, Misc. (EUCERIN) cream Apply 1 application topically as needed for dry skin.  Marland Kitchen spironolactone (ALDACTONE) 25 MG tablet Take 25 mg by mouth at bedtime.   . tamsulosin (FLOMAX) 0.4 MG CAPS Take 0.4 mg by mouth every evening.   . topiramate (TOPAMAX) 25 MG capsule Take 50 mg by mouth 2 (two) times daily.   . traMADol (ULTRAM) 50 MG tablet Take 50 mg by mouth every 12 (twelve) hours as needed for moderate pain.   . Azelastine-Fluticasone 137-50 MCG/ACT SUSP Place 2 sprays into both nostrils daily. (Patient not taking: Reported on 04/18/2016)  . doxycycline (VIBRA-TABS) 100 MG tablet Take 1 tablet (100 mg total) by mouth 2 (two) times daily.  Marland Kitchen losartan (COZAAR) 25 MG tablet Take 1 tablet (25 mg total) by mouth daily. (Patient taking differently: Take 25 mg by mouth at bedtime. )  . predniSONE (DELTASONE) 10 MG tablet 4 tabs daily for 3 days and stop .   No facility-administered encounter medications on file as of 04/28/2016.      Review of Systems  Constitutional:   No  weight loss, night sweats,  Fevers, chills, fatigue, or  lassitude.  HEENT:   No headaches,  Difficulty swallowing,  Tooth/dental problems, or  Sore throat,                No sneezing,  itching, ear ache, + nasal congestion, post nasal drip,   CV:  No chest pain,  Orthopnea, PND, swelling in lower extremities, anasarca, dizziness, palpitations, syncope.   GI  No heartburn, indigestion, abdominal pain, nausea, vomiting, diarrhea, change in bowel habits, loss of appetite, bloody stools.   Resp: No shortness of breath with exertion or at rest.  No excess mucus, no productive cough,  No non-productive cough,  No coughing up of blood.  No change in color of mucus.  No wheezing.  No chest wall deformity  Skin: no rash or lesions.  GU: no dysuria, change in color of urine, no urgency or frequency.  No flank pain, no hematuria   MS:  No joint pain or swelling.  No decreased range of motion.  No back pain.    Physical Exam  BP (!) 104/58 (BP Location: Left Arm, Cuff Size: Large)   Pulse (!) 58   Ht 5\' 6"  (1.676 m)   Wt 256 lb 12.8 oz (116.5 kg)   SpO2 97%   BMI 41.45 kg/m   GEN: A/Ox3; pleasant , NAD, well nourished    HEENT:  [place]/AT,  EACs-clear, TMs-wnl, NOSE-clear drainage , THROAT-clear, no lesions, no postnasal drip or exudate noted.   NECK:  Supple w/ fair ROM; no JVD; normal carotid impulses w/o bruits; no thyromegaly or nodules palpated; no lymphadenopathy.    RESP  Few trace rhonchi ,  no accessory muscle use, no dullness to percussion  CARD:  RRR, no m/r/g, no peripheral edema, pulses intact, no cyanosis or clubbing.  GI:   Soft & nt; nml bowel sounds; no organomegaly or masses detected.   Musco: Warm bil, no deformities or joint swelling noted.   Neuro: alert, no focal deficits noted.    Skin: Warm, no  lesions or rashes   Lab Results:  CBC    Component Value Date/Time   WBC 6.3 04/21/2016 0930   RBC 4.33 04/21/2016 0930   HGB 12.8 (L) 04/21/2016 0930   HCT 39.5 04/21/2016 0930   PLT 135 (L) 04/21/2016 0930   MCV 91.2 04/21/2016 0930   MCH 29.6 04/21/2016 0930   MCHC 32.4 04/21/2016 0930   RDW 15.1 04/21/2016 0930   LYMPHSABS 1.4  04/21/2016 0930   MONOABS 0.6 04/21/2016 0930   EOSABS 0.0 04/21/2016 0930   BASOSABS 0.0 04/21/2016 0930    BMET    Component Value Date/Time   NA 139 04/21/2016 0930   K 3.9 04/21/2016 0930   CL 106 04/21/2016 0930   CO2 26 04/21/2016 0930   GLUCOSE 163 (H) 04/21/2016 0930   BUN 19 04/21/2016 0930   CREATININE 1.03 04/21/2016 0930   CREATININE 1.11 10/14/2015 0929   CALCIUM 9.0 04/21/2016 0930   GFRNONAA >60 04/21/2016 0930   GFRAA >60 04/21/2016 0930    BNP No results found for: BNP  ProBNP    Component Value Date/Time   PROBNP 96.0 05/11/2015 1443    Imaging: Dg Chest 2 View  Result Date: 03/31/2016 CLINICAL DATA:  Tightness.  Cough for 1 month. EXAM: CHEST  2 VIEW COMPARISON:  04/22/2015 FINDINGS: Normal heart size. No pleural effusion or edema. Chronic bronchitic changes are identified bilaterally. There is degenerative disc disease within the thoracic spine. IMPRESSION: 1. Chronic bronchitic changes. 2. Thoracic spondylosis. Electronically Signed   By: Kerby Moors M.D.   On: 03/31/2016 08:37     Assessment & Plan:   Mild persistent asthma, well controlled Flare with bronchitis   Plan  Patient Instructions  Extend Doxycycline for 7 days .  Mucinex DM Twice daily  As needed  Cough/congestion  Prednisone 40mg  daily for 3 days and stop.  Fluids and rest  Please contact office for sooner follow up if symptoms do not improve or worsen or seek emergency care  Follow up Dr. Halford Chessman  As planned and As needed        Allergic rhinitis Flare ? sinusiits   Plan  Patient Instructions  Extend Doxycycline for 7 days .  Mucinex DM Twice daily  As needed  Cough/congestion  Prednisone 40mg  daily for 3 days and stop.  Fluids and rest  Please contact office for sooner follow up if symptoms do not improve or worsen or seek emergency care  Follow up Dr. Halford Chessman  As planned and As needed           Rexene Edison, NP 04/28/2016

## 2016-04-30 ENCOUNTER — Telehealth: Payer: Self-pay | Admitting: Pulmonary Disease

## 2016-04-30 NOTE — Telephone Encounter (Signed)
Pt is scheduled to have T12 to L2 laminectomy with Dr. Sherley Bounds on 05/04/16.  He was seen by TP on 04/28/16.  He needs to have ROV with me or NP prior to 05/04/16 to assess whether his asthma exacerbation has resolved prior to him undergoing surgery.

## 2016-04-30 NOTE — Progress Notes (Signed)
I have reviewed and agree with assessment/plan.  Chesley Mires, MD [facility]/Critical Care 04/30/2016, 11:40 AM Pager:  [phone]

## 2016-05-01 DIAGNOSIS — C61 Malignant neoplasm of prostate: Secondary | ICD-10-CM | POA: Diagnosis not present

## 2016-05-01 NOTE — Telephone Encounter (Signed)
Yes that is fine tomorrow morning

## 2016-05-01 NOTE — Telephone Encounter (Signed)
Spoke with pt and informed him of TP's message, but pt states he already has an appointment this week. Checked he has an appointment with VS on 05/03/16. Spoke with Ashtyn to make sure this was ok for pt to go over this during this visit, she gave the ok. Pt is aware. Nothing further is needed.

## 2016-05-01 NOTE — Telephone Encounter (Signed)
lmtcb x1 pt 

## 2016-05-01 NOTE — Telephone Encounter (Signed)
lmom tcb x1 to pt  Per JJ we can use TP's 11am appointment slot tomorrow

## 2016-05-01 NOTE — Telephone Encounter (Signed)
Please advise TP is you are able to work patient in tomorrow or Wednesday for follow up of Asthma Exacerbation prior to upcoming surgery. Thanks.

## 2016-05-01 NOTE — Telephone Encounter (Signed)
Patient returned phone call, patient contact # [phone].Carlos Dawson

## 2016-05-03 ENCOUNTER — Encounter: Payer: Self-pay | Admitting: Pulmonary Disease

## 2016-05-03 ENCOUNTER — Ambulatory Visit (INDEPENDENT_AMBULATORY_CARE_PROVIDER_SITE_OTHER): Payer: Medicare Other | Admitting: Pulmonary Disease

## 2016-05-03 VITALS — BP 98/62 | HR 62 | Ht 66.0 in | Wt 273.0 lb

## 2016-05-03 DIAGNOSIS — G4733 Obstructive sleep apnea (adult) (pediatric): Secondary | ICD-10-CM

## 2016-05-03 DIAGNOSIS — Z01811 Encounter for preprocedural respiratory examination: Secondary | ICD-10-CM | POA: Diagnosis not present

## 2016-05-03 DIAGNOSIS — J309 Allergic rhinitis, unspecified: Secondary | ICD-10-CM | POA: Diagnosis not present

## 2016-05-03 DIAGNOSIS — Z6841 Body Mass Index (BMI) 40.0 and over, adult: Secondary | ICD-10-CM | POA: Diagnosis not present

## 2016-05-03 DIAGNOSIS — J4530 Mild persistent asthma, uncomplicated: Secondary | ICD-10-CM

## 2016-05-03 DIAGNOSIS — J9611 Chronic respiratory failure with hypoxia: Secondary | ICD-10-CM

## 2016-05-03 MED ORDER — DEXAMETHASONE SODIUM PHOSPHATE 10 MG/ML IJ SOLN
10.0000 mg | INTRAMUSCULAR | Status: AC
  Administered 2016-05-04: 10 mg via INTRAVENOUS
  Filled 2016-05-03: qty 1

## 2016-05-03 MED ORDER — DEXTROSE 5 % IV SOLN
3.0000 g | INTRAVENOUS | Status: AC
  Administered 2016-05-04: 3 g via INTRAVENOUS
  Filled 2016-05-03: qty 3000

## 2016-05-03 NOTE — Patient Instructions (Signed)
Follow up in 6 months 

## 2016-05-03 NOTE — Progress Notes (Signed)
Current Outpatient Prescriptions on File Prior to Visit  Medication Sig  . acetaminophen (TYLENOL) 500 MG tablet Take 1,000 mg by mouth every 8 (eight) hours as needed for mild pain.   Marland Kitchen albuterol (PROAIR HFA) 108 (90 Base) MCG/ACT inhaler 2 puffs every 4 hours as needed only  if your can't catch your breath (Patient taking differently: Inhale 2 puffs into the lungs every 4 (four) hours as needed for wheezing or shortness of breath. )  . atenolol (TENORMIN) 25 MG tablet Take 25 mg by mouth every morning.   Marland Kitchen atorvastatin (LIPITOR) 10 MG tablet Take 10 mg by mouth daily at 6 PM.   . azelastine (ASTELIN) 0.1 % nasal spray Place 2 sprays into both nostrils daily. Use in each nostril as directed  . Azelastine-Fluticasone 137-50 MCG/ACT SUSP Place 2 sprays into both nostrils daily.  . budesonide-formoterol (SYMBICORT) 160-4.5 MCG/ACT inhaler Inhale 2 puffs into the lungs 2 (two) times daily. (Patient taking differently: Inhale 2 puffs into the lungs 2 (two) times daily as needed (SHORTNESS OF BREATH). )  . celecoxib (CELEBREX) 200 MG capsule Take 200 mg by mouth 2 (two) times daily.  . Cholecalciferol (VITAMIN D-3) 5000 units TABS Take 5,000 Units by mouth daily.  Marland Kitchen dicyclomine (BENTYL) 20 MG tablet Take 20 mg by mouth 2 (two) times daily as needed for spasms.  Marland Kitchen doxycycline (VIBRA-TABS) 100 MG tablet Take 1 tablet (100 mg total) by mouth 2 (two) times daily.  . fluticasone (FLONASE) 50 MCG/ACT nasal spray Place 2 sprays into both nostrils daily.  . furosemide (LASIX) 80 MG tablet Take 1 tablet (80 mg total) by mouth daily. Take 80 mg in the morning and 40 mg in the afternoon. (Patient taking differently: Take 40-80 mg by mouth daily. Take 80 mg in the morning and 40 mg in the afternoon.)  . HYDROcodone-homatropine (HYDROMET) 5-1.5 MG/5ML syrup Take 5 mLs by mouth every 6 (six) hours as needed.  . metFORMIN (GLUCOPHAGE-XR) 500 MG 24 hr tablet Take 500 mg by mouth daily before supper.  . methocarbamol  (ROBAXIN) 500 MG tablet Take 1 tablet (500 mg total) by mouth 2 (two) times daily as needed for muscle spasms.  . montelukast (SINGULAIR) 10 MG tablet Take 10 mg by mouth at bedtime.  . nitroGLYCERIN (NITROSTAT) 0.4 MG SL tablet Place 1 tablet (0.4 mg total) under the tongue every 5 (five) minutes as needed for chest pain.  Marland Kitchen omeprazole (PRILOSEC) 20 MG capsule Take 20 mg by mouth daily before breakfast.  . potassium chloride SA (KLOR-CON M20) 20 MEQ tablet Take 2 tablets (40 mEq total) by mouth 2 (two) times daily.  . Pyridoxine HCl (B-6 PO) Take 2 tablets by mouth as needed.   . rivaroxaban (XARELTO) 20 MG TABS tablet Take 20 mg by mouth daily with supper.   . sildenafil (VIAGRA) 100 MG tablet Take 100 mg by mouth as needed for erectile dysfunction.   . Skin Protectants, Misc. (EUCERIN) cream Apply 1 application topically as needed for dry skin.  Marland Kitchen spironolactone (ALDACTONE) 25 MG tablet Take 25 mg by mouth at bedtime.   . tamsulosin (FLOMAX) 0.4 MG CAPS Take 0.4 mg by mouth every evening.   . topiramate (TOPAMAX) 25 MG capsule Take 50 mg by mouth 2 (two) times daily.   . traMADol (ULTRAM) 50 MG tablet Take 50 mg by mouth every 12 (twelve) hours as needed for moderate pain.   Marland Kitchen losartan (COZAAR) 25 MG tablet Take 1 tablet (25 mg total) by  mouth daily. (Patient taking differently: Take 25 mg by mouth at bedtime. )   Current Facility-Administered Medications on File Prior to Visit  Medication  . [START ON 05/04/2016] ceFAZolin (ANCEF) 3 g in dextrose 5 % 50 mL IVPB  . [START ON 05/04/2016] dexamethasone (DECADRON) injection 10 mg     Chief Complaint  Patient presents with  . Follow-up    Surgery Clearance. Pt states that his breathing has been doing okay since last OV. Pt having some nasal congestion on the right side of his sinuses. Pt scheduled for surgery 05/04/16 at 1pm for back surgery. ; Stil using CPAP nightly. Denies any issues with mask/pressure. DME: [facility]     Pulmonary tests PFT  12/20/10 >> FEV1 2.13(79%), FEV1% 71, TLC 5.80(100%), DLCO 57%, no BD CT chest 09/14/11 >> Stable dilatation of ascending thoracic aorta 4.7 cm, PA 4.1 cm Spirometry 05/11/15 >> FEV1 1.68 (55%), FEV1% 69 CT angio chest 10/01/14 >> aortic dilation FeNO 08/13/15 >> 13 Room air SpO2 with exertion 08/13/15 >> 87% - improved with 2 liters oxygen  Sleep tests PSG 03/30/97 >> AHI 21 ONO with BPAP and room air 12/12/10 >> Test time 6 hrs 56 min. Basal SpO2 93.1%, low SpO2 86%. Spent 2.8 min with SpO2 < 88%. PSG 10/21/13 >> AHI 40.1, SaO2 80%, PLMI 75.6, 7 beat VT CPAP 12/28/13 >> CPAP 10 cm H2O >> AHI 1.5, +R  ONO with CPAP 08/19/15 >> test time 8 hrs 40 min.  Basal SpO2 92.3%, low SpO2 86%.  Spent 1.3 min with SpO2 < 88%. CPAP 01/30/16 to 02/28/16 >> used on 28 of 30 nights with average 7 hrs 30 min.  Average AHI 2.1 with CPAP 10 cm H2O  Cardiac tests Echo 04/27/14 >> EF 55 to 13%, grade 1 diastolic CHF, dilated aortic root  Past medical history HTN, diastolic dysfx, CAD s/p stent, PAF, HLD, TAA, PE in 2008, HA, GERD, IBS, Obesity s/p gastric bypass in 2009, benign positional vertigo, Prostate cancer  Past surgical history, Family history, Social history, Allergies reviewed  Vital Signs BP 98/62 (BP Location: Left Arm, Cuff Size: Normal)   Pulse 62   Ht 5\' 6"  (1.676 m)   Wt 273 lb (123.8 kg)   SpO2 95%   BMI 44.06 kg/m   History of Present Illness Carlos Dawson is a 76 y.o. male former smoker with OSA, and asthma.  He was treated for an asthma exacerbation earlier this month.  He has been feeling better.  Currently denies cough, wheeze, chest pain, sputum, fever.  Still has some sinus congestion, mostly on right side.  Denies abdominal pain or leg swelling.  He is schedule for lower back surgery with Dr. Ronnald Ramp on 05/04/16.  He uses his CPAP and oxygen at night.  Physical Exam  General - pleasant Eyes - wears glasses ENT - no sinus tenderness, clear nasal drainage, no oral exudate, no  LAN Cardiac - regular, no murmur Chest - no wheeze Back - no tenderness Abd - soft, non tender Ext - no edema Neuro - normal strength Skin - no rashes Psych - normal mood   CXR 03/31/16 - chronic bronchitic changes   CBC Latest Ref Rng & Units 04/21/2016 03/15/2016 05/11/2015  WBC 4.0 - 10.5 K/uL 6.3 6.9 5.1  Hemoglobin 13.0 - 17.0 g/dL 12.8(L) 12.8(L) 11.9(L)  Hematocrit 39.0 - 52.0 % 39.5 39.8 35.1(L)  Platelets 150 - 400 K/uL 135(L) 144(L) 138.0(L)    CMP Latest Ref Rng & Units 04/21/2016 03/15/2016  10/14/2015  Glucose 65 - 99 mg/dL 163(H) 132(H) 129(H)  BUN 6 - 20 mg/dL 19 20 21   Creatinine 0.61 - 1.24 mg/dL 1.03 1.09 1.11  Sodium 135 - 145 mmol/L 139 140 141  Potassium 3.5 - 5.1 mmol/L 3.9 3.8 4.0  Chloride 101 - 111 mmol/L 106 105 107  CO2 22 - 32 mmol/L 26 25 22   Calcium 8.9 - 10.3 mg/dL 9.0 9.2 9.0  Total Protein 6.5 - 8.1 g/dL - 6.9 -  Total Bilirubin 0.3 - 1.2 mg/dL - 1.3(H) -  Alkaline Phos 38 - 126 U/L - 89 -  AST 15 - 41 U/L - 25 -  ALT 17 - 63 U/L - 19 -     Assessment/Plan  Obstructive sleep apnea. - he is compliant with CPAP and reports benefit - continue CPAP 10 cm H2O  Mild, persistent asthma. - he has improved from recent exacerbation - continue symbicort, singulair, and prn albuterol  Allergic rhinitis with upper airway cough syndrome. - continue dymista, singulair  Chronic hypoxic respiratory failure. - continue 2 liters oxygen at night and prn during the day  Preoperative respiratory assessment. - he can proceed with back surgery   Patient Instructions  Follow up in 6 months   Time spent 27 minutes face to face time  Chesley Mires, MD  Pulmonary/Critical Care/Sleep Pager:  [phone] 05/03/2016, 5:50 PM

## 2016-05-04 ENCOUNTER — Encounter (HOSPITAL_COMMUNITY)
Admission: RE | Disposition: A | Discharge status: Home / Self Care | Payer: Self-pay | Source: Ambulatory Visit | Attending: Neurological Surgery

## 2016-05-04 ENCOUNTER — Encounter (HOSPITAL_COMMUNITY): Payer: Self-pay | Admitting: Urology

## 2016-05-04 ENCOUNTER — Inpatient Hospital Stay (HOSPITAL_COMMUNITY): Payer: Medicare Other | Admitting: Vascular Surgery

## 2016-05-04 ENCOUNTER — Observation Stay (HOSPITAL_COMMUNITY)
Admission: RE | Admit: 2016-05-04 | Discharge: 2016-05-05 | Disposition: A | Discharge status: Home / Self Care | Payer: Medicare Other | Source: Ambulatory Visit | Attending: Neurological Surgery | Admitting: Neurological Surgery

## 2016-05-04 ENCOUNTER — Inpatient Hospital Stay (HOSPITAL_COMMUNITY): Payer: Medicare Other | Admitting: Anesthesiology

## 2016-05-04 ENCOUNTER — Inpatient Hospital Stay (HOSPITAL_COMMUNITY): Payer: Medicare Other

## 2016-05-04 DIAGNOSIS — I739 Peripheral vascular disease, unspecified: Secondary | ICD-10-CM | POA: Diagnosis not present

## 2016-05-04 DIAGNOSIS — M48062 Spinal stenosis, lumbar region with neurogenic claudication: Secondary | ICD-10-CM | POA: Diagnosis not present

## 2016-05-04 DIAGNOSIS — K589 Irritable bowel syndrome without diarrhea: Secondary | ICD-10-CM | POA: Diagnosis not present

## 2016-05-04 DIAGNOSIS — Z7901 Long term (current) use of anticoagulants: Secondary | ICD-10-CM | POA: Insufficient documentation

## 2016-05-04 DIAGNOSIS — G4733 Obstructive sleep apnea (adult) (pediatric): Secondary | ICD-10-CM | POA: Diagnosis not present

## 2016-05-04 DIAGNOSIS — I2510 Atherosclerotic heart disease of native coronary artery without angina pectoris: Secondary | ICD-10-CM | POA: Diagnosis not present

## 2016-05-04 DIAGNOSIS — Z7951 Long term (current) use of inhaled steroids: Secondary | ICD-10-CM | POA: Insufficient documentation

## 2016-05-04 DIAGNOSIS — I480 Paroxysmal atrial fibrillation: Secondary | ICD-10-CM | POA: Insufficient documentation

## 2016-05-04 DIAGNOSIS — I110 Hypertensive heart disease with heart failure: Secondary | ICD-10-CM | POA: Diagnosis not present

## 2016-05-04 DIAGNOSIS — J4520 Mild intermittent asthma, uncomplicated: Secondary | ICD-10-CM | POA: Diagnosis not present

## 2016-05-04 DIAGNOSIS — Z955 Presence of coronary angioplasty implant and graft: Secondary | ICD-10-CM | POA: Diagnosis not present

## 2016-05-04 DIAGNOSIS — M4805 Spinal stenosis, thoracolumbar region: Principal | ICD-10-CM | POA: Insufficient documentation

## 2016-05-04 DIAGNOSIS — I712 Thoracic aortic aneurysm, without rupture: Secondary | ICD-10-CM | POA: Insufficient documentation

## 2016-05-04 DIAGNOSIS — M79604 Pain in right leg: Secondary | ICD-10-CM | POA: Diagnosis present

## 2016-05-04 DIAGNOSIS — Z6841 Body Mass Index (BMI) 40.0 and over, adult: Secondary | ICD-10-CM | POA: Diagnosis not present

## 2016-05-04 DIAGNOSIS — Z87891 Personal history of nicotine dependence: Secondary | ICD-10-CM | POA: Diagnosis not present

## 2016-05-04 DIAGNOSIS — M5415 Radiculopathy, thoracolumbar region: Secondary | ICD-10-CM | POA: Diagnosis not present

## 2016-05-04 DIAGNOSIS — E785 Hyperlipidemia, unspecified: Secondary | ICD-10-CM | POA: Diagnosis not present

## 2016-05-04 DIAGNOSIS — E119 Type 2 diabetes mellitus without complications: Secondary | ICD-10-CM | POA: Insufficient documentation

## 2016-05-04 DIAGNOSIS — Z86711 Personal history of pulmonary embolism: Secondary | ICD-10-CM | POA: Diagnosis not present

## 2016-05-04 DIAGNOSIS — Z419 Encounter for procedure for purposes other than remedying health state, unspecified: Secondary | ICD-10-CM

## 2016-05-04 DIAGNOSIS — Z9889 Other specified postprocedural states: Secondary | ICD-10-CM

## 2016-05-04 DIAGNOSIS — Z791 Long term (current) use of non-steroidal anti-inflammatories (NSAID): Secondary | ICD-10-CM | POA: Diagnosis not present

## 2016-05-04 DIAGNOSIS — Z7984 Long term (current) use of oral hypoglycemic drugs: Secondary | ICD-10-CM | POA: Insufficient documentation

## 2016-05-04 DIAGNOSIS — Z79899 Other long term (current) drug therapy: Secondary | ICD-10-CM | POA: Diagnosis not present

## 2016-05-04 DIAGNOSIS — M961 Postlaminectomy syndrome, not elsewhere classified: Secondary | ICD-10-CM | POA: Diagnosis not present

## 2016-05-04 DIAGNOSIS — I5030 Unspecified diastolic (congestive) heart failure: Secondary | ICD-10-CM | POA: Diagnosis not present

## 2016-05-04 DIAGNOSIS — M48061 Spinal stenosis, lumbar region without neurogenic claudication: Secondary | ICD-10-CM | POA: Insufficient documentation

## 2016-05-04 DIAGNOSIS — K219 Gastro-esophageal reflux disease without esophagitis: Secondary | ICD-10-CM | POA: Insufficient documentation

## 2016-05-04 DIAGNOSIS — Z9989 Dependence on other enabling machines and devices: Secondary | ICD-10-CM | POA: Insufficient documentation

## 2016-05-04 HISTORY — PX: LUMBAR LAMINECTOMY/DECOMPRESSION MICRODISCECTOMY: SHX5026

## 2016-05-04 LAB — GLUCOSE, CAPILLARY
Glucose-Capillary: 145 mg/dL — ABNORMAL HIGH (ref 65–99)
Glucose-Capillary: 128 mg/dL — ABNORMAL HIGH (ref 65–99)
Glucose-Capillary: 148 mg/dL — ABNORMAL HIGH (ref 65–99)

## 2016-05-04 LAB — PROTIME-INR
INR: 1.15
Prothrombin Time: 14.8 seconds (ref 11.4–15.2)

## 2016-05-04 SURGERY — LUMBAR LAMINECTOMY/DECOMPRESSION MICRODISCECTOMY 2 LEVELS
Anesthesia: General | Site: Back

## 2016-05-04 MED ORDER — MENTHOL 3 MG MT LOZG
1.0000 | LOZENGE | OROMUCOSAL | Status: DC | PRN
Start: 2016-05-04 — End: 2016-05-05

## 2016-05-04 MED ORDER — MOMETASONE FURO-FORMOTEROL FUM 200-5 MCG/ACT IN AERO
2.0000 | INHALATION_SPRAY | Freq: Two times a day (BID) | RESPIRATORY_TRACT | Status: DC
  Administered 2016-05-05: 2 via RESPIRATORY_TRACT
  Filled 2016-05-04: qty 8.8

## 2016-05-04 MED ORDER — AZELASTINE HCL 0.1 % NA SOLN
2.0000 | Freq: Every day | NASAL | Status: DC
  Administered 2016-05-05: 2 via NASAL
  Filled 2016-05-04: qty 30

## 2016-05-04 MED ORDER — HEMOSTATIC AGENTS (NO CHARGE) OPTIME
TOPICAL | Status: DC | PRN
  Administered 2016-05-04: 1 via TOPICAL

## 2016-05-04 MED ORDER — METHOCARBAMOL 500 MG PO TABS
500.0000 mg | ORAL_TABLET | Freq: Four times a day (QID) | ORAL | Status: DC | PRN
  Administered 2016-05-04: 500 mg via ORAL

## 2016-05-04 MED ORDER — AZELASTINE-FLUTICASONE 137-50 MCG/ACT NA SUSP: 2.0000 | Freq: Every day | NASAL | Status: DC

## 2016-05-04 MED ORDER — ONDANSETRON HCL 4 MG/2ML IJ SOLN: 4.0000 mg | Freq: Four times a day (QID) | INTRAMUSCULAR | Status: DC | PRN

## 2016-05-04 MED ORDER — SPIRONOLACTONE 25 MG PO TABS
25.0000 mg | ORAL_TABLET | Freq: Every day | ORAL | Status: DC
  Administered 2016-05-04: 25 mg via ORAL
  Filled 2016-05-04: qty 1

## 2016-05-04 MED ORDER — BACITRACIN 50000 UNITS IM SOLR
INTRAMUSCULAR | Status: DC | PRN
  Administered 2016-05-04: 500 mL

## 2016-05-04 MED ORDER — FUROSEMIDE 80 MG PO TABS
80.0000 mg | ORAL_TABLET | Freq: Every day | ORAL | Status: DC
  Administered 2016-05-05: 80 mg via ORAL
  Filled 2016-05-04 (×2): qty 1

## 2016-05-04 MED ORDER — LOSARTAN POTASSIUM 25 MG PO TABS
25.0000 mg | ORAL_TABLET | Freq: Every day | ORAL | Status: DC
  Administered 2016-05-04: 25 mg via ORAL
  Filled 2016-05-04: qty 1

## 2016-05-04 MED ORDER — POTASSIUM CHLORIDE IN NACL 20-0.9 MEQ/L-% IV SOLN
INTRAVENOUS | Status: DC
  Administered 2016-05-04 – 2016-05-05 (×2): via INTRAVENOUS
  Filled 2016-05-04: qty 1000

## 2016-05-04 MED ORDER — FENTANYL CITRATE (PF) 100 MCG/2ML IJ SOLN
INTRAMUSCULAR | Status: AC
  Administered 2016-05-04: 50 ug via INTRAVENOUS
  Filled 2016-05-04: qty 2

## 2016-05-04 MED ORDER — FENTANYL CITRATE (PF) 250 MCG/5ML IJ SOLN
INTRAMUSCULAR | Status: AC
  Filled 2016-05-04: qty 5

## 2016-05-04 MED ORDER — LACTATED RINGERS IV SOLN
INTRAVENOUS | Status: DC
  Administered 2016-05-04 (×2): via INTRAVENOUS

## 2016-05-04 MED ORDER — LIDOCAINE 2% (20 MG/ML) 5 ML SYRINGE
INTRAMUSCULAR | Status: AC
  Filled 2016-05-04: qty 5

## 2016-05-04 MED ORDER — THROMBIN 5000 UNITS EX SOLR
CUTANEOUS | Status: AC
  Filled 2016-05-04: qty 5000

## 2016-05-04 MED ORDER — SENNA 8.6 MG PO TABS
1.0000 | ORAL_TABLET | Freq: Two times a day (BID) | ORAL | Status: DC
  Administered 2016-05-04 – 2016-05-05 (×2): 8.6 mg via ORAL
  Filled 2016-05-04 (×2): qty 1

## 2016-05-04 MED ORDER — PHENYLEPHRINE HCL 10 MG/ML IJ SOLN
INTRAMUSCULAR | Status: DC | PRN
  Administered 2016-05-04: 25 ug/min via INTRAVENOUS

## 2016-05-04 MED ORDER — SODIUM CHLORIDE 0.9 % IV SOLN: 250.0000 mL | INTRAVENOUS | Status: DC

## 2016-05-04 MED ORDER — HYDROCODONE-ACETAMINOPHEN 7.5-325 MG PO TABS
ORAL_TABLET | ORAL | Status: AC
  Administered 2016-05-04: 1 via ORAL
  Filled 2016-05-04: qty 1

## 2016-05-04 MED ORDER — THROMBIN 5000 UNITS EX SOLR
CUTANEOUS | Status: DC | PRN
  Administered 2016-05-04 (×2): 5000 [IU] via TOPICAL

## 2016-05-04 MED ORDER — PROPOFOL 10 MG/ML IV BOLUS
INTRAVENOUS | Status: AC
  Filled 2016-05-04: qty 20

## 2016-05-04 MED ORDER — FUROSEMIDE 40 MG PO TABS
40.0000 mg | ORAL_TABLET | Freq: Every day | ORAL | Status: DC
  Administered 2016-05-04: 40 mg via ORAL
  Filled 2016-05-04: qty 1

## 2016-05-04 MED ORDER — BUPIVACAINE HCL (PF) 0.25 % IJ SOLN
INTRAMUSCULAR | Status: DC | PRN
  Administered 2016-05-04: 5 mL

## 2016-05-04 MED ORDER — ALBUTEROL SULFATE (2.5 MG/3ML) 0.083% IN NEBU: 2.5000 mg | INHALATION_SOLUTION | RESPIRATORY_TRACT | Status: DC | PRN

## 2016-05-04 MED ORDER — DICYCLOMINE HCL 20 MG PO TABS: 20.0000 mg | ORAL_TABLET | Freq: Two times a day (BID) | ORAL | Status: DC | PRN

## 2016-05-04 MED ORDER — FENTANYL CITRATE (PF) 100 MCG/2ML IJ SOLN
25.0000 ug | INTRAMUSCULAR | Status: DC | PRN
  Administered 2016-05-04 (×3): 50 ug via INTRAVENOUS

## 2016-05-04 MED ORDER — ONDANSETRON HCL 4 MG PO TABS: 4.0000 mg | ORAL_TABLET | Freq: Four times a day (QID) | ORAL | Status: DC | PRN

## 2016-05-04 MED ORDER — CHLORHEXIDINE GLUCONATE CLOTH 2 % EX PADS: 6.0000 | MEDICATED_PAD | Freq: Once | CUTANEOUS | Status: DC

## 2016-05-04 MED ORDER — ONDANSETRON HCL 4 MG/2ML IJ SOLN
INTRAMUSCULAR | Status: DC | PRN
  Administered 2016-05-04: 4 mg via INTRAVENOUS

## 2016-05-04 MED ORDER — TOPIRAMATE 25 MG PO TABS
50.0000 mg | ORAL_TABLET | Freq: Two times a day (BID) | ORAL | Status: DC
  Administered 2016-05-04 – 2016-05-05 (×2): 50 mg via ORAL
  Filled 2016-05-04 (×2): qty 2

## 2016-05-04 MED ORDER — PHENOL 1.4 % MT LIQD
1.0000 | OROMUCOSAL | Status: DC | PRN
Start: 2016-05-04 — End: 2016-05-05
  Administered 2016-05-04: 1 via OROMUCOSAL
  Filled 2016-05-04: qty 177

## 2016-05-04 MED ORDER — THROMBIN 5000 UNITS EX SOLR
CUTANEOUS | Status: AC
  Filled 2016-05-04: qty 10000

## 2016-05-04 MED ORDER — METHOCARBAMOL 1000 MG/10ML IJ SOLN
500.0000 mg | Freq: Four times a day (QID) | INTRAVENOUS | Status: DC | PRN
  Filled 2016-05-04: qty 5

## 2016-05-04 MED ORDER — SODIUM CHLORIDE 0.9% FLUSH: 3.0000 mL | Freq: Two times a day (BID) | INTRAVENOUS | Status: DC

## 2016-05-04 MED ORDER — TAMSULOSIN HCL 0.4 MG PO CAPS
0.4000 mg | ORAL_CAPSULE | Freq: Every evening | ORAL | Status: DC
Start: 2016-05-04 — End: 2016-05-05
  Administered 2016-05-04: 0.4 mg via ORAL
  Filled 2016-05-04: qty 1

## 2016-05-04 MED ORDER — 0.9 % SODIUM CHLORIDE (POUR BTL) OPTIME
TOPICAL | Status: DC | PRN
  Administered 2016-05-04: 1000 mL

## 2016-05-04 MED ORDER — MONTELUKAST SODIUM 10 MG PO TABS
10.0000 mg | ORAL_TABLET | Freq: Every day | ORAL | Status: DC
  Administered 2016-05-04: 10 mg via ORAL
  Filled 2016-05-04 (×2): qty 1

## 2016-05-04 MED ORDER — ATENOLOL 25 MG PO TABS
25.0000 mg | ORAL_TABLET | ORAL | Status: DC
  Administered 2016-05-05: 25 mg via ORAL
  Filled 2016-05-04: qty 1

## 2016-05-04 MED ORDER — PROPOFOL 10 MG/ML IV BOLUS
INTRAVENOUS | Status: DC | PRN
  Administered 2016-05-04: 20 mg via INTRAVENOUS
  Administered 2016-05-04: 30 mg via INTRAVENOUS
  Administered 2016-05-04: 120 mg via INTRAVENOUS
  Administered 2016-05-04: 30 mg via INTRAVENOUS

## 2016-05-04 MED ORDER — OXYCODONE HCL 5 MG/5ML PO SOLN: 5.0000 mg | Freq: Once | ORAL | Status: AC | PRN

## 2016-05-04 MED ORDER — THROMBIN 20000 UNITS EX SOLR
CUTANEOUS | Status: DC | PRN
  Administered 2016-05-04: 20 mL via TOPICAL

## 2016-05-04 MED ORDER — HYDROMORPHONE HCL 1 MG/ML IJ SOLN
1.0000 mg | INTRAMUSCULAR | Status: DC | PRN
  Administered 2016-05-04: 1 mg via INTRAVENOUS

## 2016-05-04 MED ORDER — ROCURONIUM BROMIDE 100 MG/10ML IV SOLN
INTRAVENOUS | Status: DC | PRN
  Administered 2016-05-04: 30 mg via INTRAVENOUS

## 2016-05-04 MED ORDER — CELECOXIB 200 MG PO CAPS
200.0000 mg | ORAL_CAPSULE | Freq: Two times a day (BID) | ORAL | Status: DC
  Filled 2016-05-04 (×2): qty 1

## 2016-05-04 MED ORDER — FENTANYL CITRATE (PF) 100 MCG/2ML IJ SOLN
INTRAMUSCULAR | Status: DC | PRN
  Administered 2016-05-04 (×3): 50 ug via INTRAVENOUS
  Administered 2016-05-04: 100 ug via INTRAVENOUS

## 2016-05-04 MED ORDER — METHOCARBAMOL 500 MG PO TABS
ORAL_TABLET | ORAL | Status: AC
Start: 2016-05-04 — End: 2016-05-04
  Administered 2016-05-04: 500 mg via ORAL
  Filled 2016-05-04: qty 1

## 2016-05-04 MED ORDER — ONDANSETRON HCL 4 MG/2ML IJ SOLN
INTRAMUSCULAR | Status: AC
  Filled 2016-05-04: qty 2

## 2016-05-04 MED ORDER — CELECOXIB 200 MG PO CAPS: 200.0000 mg | ORAL_CAPSULE | Freq: Two times a day (BID) | ORAL | Status: DC

## 2016-05-04 MED ORDER — OXYCODONE HCL 5 MG PO TABS
ORAL_TABLET | ORAL | Status: AC
  Administered 2016-05-04: 5 mg via ORAL
  Filled 2016-05-04: qty 1

## 2016-05-04 MED ORDER — PANTOPRAZOLE SODIUM 40 MG PO TBEC
40.0000 mg | DELAYED_RELEASE_TABLET | Freq: Every day | ORAL | Status: DC
  Administered 2016-05-05: 40 mg via ORAL
  Filled 2016-05-04: qty 1

## 2016-05-04 MED ORDER — ACETAMINOPHEN 325 MG PO TABS: 650.0000 mg | ORAL_TABLET | ORAL | Status: DC | PRN

## 2016-05-04 MED ORDER — NITROGLYCERIN 0.4 MG SL SUBL
0.4000 mg | SUBLINGUAL_TABLET | SUBLINGUAL | Status: DC | PRN
Start: 2016-05-04 — End: 2016-05-05

## 2016-05-04 MED ORDER — FENTANYL CITRATE (PF) 100 MCG/2ML IJ SOLN
INTRAMUSCULAR | Status: AC
  Filled 2016-05-04: qty 2

## 2016-05-04 MED ORDER — OXYCODONE HCL 5 MG PO TABS
5.0000 mg | ORAL_TABLET | Freq: Once | ORAL | Status: AC | PRN
  Administered 2016-05-04: 5 mg via ORAL

## 2016-05-04 MED ORDER — BUPIVACAINE HCL (PF) 0.25 % IJ SOLN
INTRAMUSCULAR | Status: AC
  Filled 2016-05-04: qty 30

## 2016-05-04 MED ORDER — MIDAZOLAM HCL 2 MG/2ML IJ SOLN
INTRAMUSCULAR | Status: AC
  Filled 2016-05-04: qty 2

## 2016-05-04 MED ORDER — CEFAZOLIN SODIUM-DEXTROSE 2-4 GM/100ML-% IV SOLN
2.0000 g | Freq: Three times a day (TID) | INTRAVENOUS | Status: AC
Start: 2016-05-04 — End: 2016-05-05
  Administered 2016-05-04 – 2016-05-05 (×2): 2 g via INTRAVENOUS
  Filled 2016-05-04 (×2): qty 100

## 2016-05-04 MED ORDER — SODIUM CHLORIDE 0.9% FLUSH: 3.0000 mL | INTRAVENOUS | Status: DC | PRN

## 2016-05-04 MED ORDER — HYDROCODONE-ACETAMINOPHEN 7.5-325 MG PO TABS
1.0000 | ORAL_TABLET | Freq: Four times a day (QID) | ORAL | Status: DC
  Administered 2016-05-04 – 2016-05-05 (×4): 1 via ORAL
  Filled 2016-05-04 (×3): qty 1

## 2016-05-04 MED ORDER — TOPIRAMATE 25 MG PO CPSP: 50.0000 mg | ORAL_CAPSULE | Freq: Two times a day (BID) | ORAL | Status: DC

## 2016-05-04 MED ORDER — LIDOCAINE HCL (CARDIAC) 20 MG/ML IV SOLN
INTRAVENOUS | Status: DC | PRN
  Administered 2016-05-04: 60 mg via INTRAVENOUS

## 2016-05-04 MED ORDER — ACETAMINOPHEN 650 MG RE SUPP: 650.0000 mg | RECTAL | Status: DC | PRN

## 2016-05-04 MED ORDER — PHENYLEPHRINE HCL 10 MG/ML IJ SOLN
INTRAMUSCULAR | Status: DC | PRN
  Administered 2016-05-04 (×2): 80 ug via INTRAVENOUS
  Administered 2016-05-04: 40 ug via INTRAVENOUS

## 2016-05-04 MED ORDER — SUGAMMADEX SODIUM 500 MG/5ML IV SOLN
INTRAVENOUS | Status: AC
  Filled 2016-05-04: qty 5

## 2016-05-04 MED ORDER — HYDROMORPHONE HCL 1 MG/ML IJ SOLN
INTRAMUSCULAR | Status: AC
  Filled 2016-05-04: qty 1

## 2016-05-04 MED ORDER — EPHEDRINE SULFATE 50 MG/ML IJ SOLN
INTRAMUSCULAR | Status: DC | PRN
  Administered 2016-05-04 (×3): 10 mg via INTRAVENOUS

## 2016-05-04 MED ORDER — ALBUTEROL SULFATE HFA 108 (90 BASE) MCG/ACT IN AERS: 2.0000 | INHALATION_SPRAY | RESPIRATORY_TRACT | Status: DC | PRN

## 2016-05-04 MED ORDER — METFORMIN HCL ER 500 MG PO TB24: 500.0000 mg | ORAL_TABLET | Freq: Every day | ORAL | Status: DC

## 2016-05-04 MED ORDER — ACETAMINOPHEN 10 MG/ML IV SOLN
1000.0000 mg | INTRAVENOUS | Status: AC
  Administered 2016-05-04: 1000 mg via INTRAVENOUS
  Filled 2016-05-04: qty 100

## 2016-05-04 SURGICAL SUPPLY — 48 items
BAG DECANTER FOR FLEXI CONT (MISCELLANEOUS) ×2 IMPLANT
BASKET BONE COLLECTION (BASKET) ×2 IMPLANT
BENZOIN TINCTURE PRP APPL 2/3 (GAUZE/BANDAGES/DRESSINGS) ×2 IMPLANT
BUR MATCHSTICK NEURO 3.0 LAGG (BURR) ×2 IMPLANT
CANISTER SUCT 3000ML PPV (MISCELLANEOUS) ×2 IMPLANT
CARTRIDGE OIL MAESTRO DRILL (MISCELLANEOUS) ×1 IMPLANT
DERMABOND ADVANCED (GAUZE/BANDAGES/DRESSINGS) ×1
DERMABOND ADVANCED .7 DNX12 (GAUZE/BANDAGES/DRESSINGS) ×1 IMPLANT
DIFFUSER DRILL AIR PNEUMATIC (MISCELLANEOUS) ×2 IMPLANT
DRAPE LAPAROTOMY 100X72X124 (DRAPES) ×2 IMPLANT
DRAPE MICROSCOPE LEICA (MISCELLANEOUS) IMPLANT
DRAPE POUCH INSTRU U-SHP 10X18 (DRAPES) ×2 IMPLANT
DRAPE SURG 17X23 STRL (DRAPES) ×2 IMPLANT
DURAPREP 26ML APPLICATOR (WOUND CARE) ×2 IMPLANT
ELECT REM PT RETURN 9FT ADLT (ELECTROSURGICAL) ×2
ELECTRODE REM PT RTRN 9FT ADLT (ELECTROSURGICAL) ×1 IMPLANT
GAUZE SPONGE 4X4 16PLY XRAY LF (GAUZE/BANDAGES/DRESSINGS) IMPLANT
GLOVE BIO SURGEON STRL SZ7 (GLOVE) IMPLANT
GLOVE BIO SURGEON STRL SZ8 (GLOVE) ×2 IMPLANT
GLOVE BIOGEL PI IND STRL 7.0 (GLOVE) IMPLANT
GLOVE BIOGEL PI INDICATOR 7.0 (GLOVE)
GLOVE INDICATOR 7.5 STRL GRN (GLOVE) ×2 IMPLANT
GLOVE SURG SS PI 7.0 STRL IVOR (GLOVE) ×2 IMPLANT
GLOVE SURG SS PI 8.0 STRL IVOR (GLOVE) ×4 IMPLANT
GOWN STRL REUS W/ TWL LRG LVL3 (GOWN DISPOSABLE) IMPLANT
GOWN STRL REUS W/ TWL XL LVL3 (GOWN DISPOSABLE) ×2 IMPLANT
GOWN STRL REUS W/TWL 2XL LVL3 (GOWN DISPOSABLE) ×4 IMPLANT
GOWN STRL REUS W/TWL LRG LVL3 (GOWN DISPOSABLE)
GOWN STRL REUS W/TWL XL LVL3 (GOWN DISPOSABLE) ×2
HEMOSTAT POWDER KIT SURGIFOAM (HEMOSTASIS) ×2 IMPLANT
KIT BASIN OR (CUSTOM PROCEDURE TRAY) ×2 IMPLANT
KIT ROOM TURNOVER OR (KITS) ×2 IMPLANT
NEEDLE HYPO 25X1 1.5 SAFETY (NEEDLE) ×2 IMPLANT
NEEDLE SPNL 20GX3.5 QUINCKE YW (NEEDLE) IMPLANT
NS IRRIG 1000ML POUR BTL (IV SOLUTION) ×2 IMPLANT
OIL CARTRIDGE MAESTRO DRILL (MISCELLANEOUS) ×2
PACK LAMINECTOMY NEURO (CUSTOM PROCEDURE TRAY) ×2 IMPLANT
PAD ARMBOARD 7.5X6 YLW CONV (MISCELLANEOUS) ×6 IMPLANT
RUBBERBAND STERILE (MISCELLANEOUS) IMPLANT
SPONGE SURGIFOAM ABS GEL SZ50 (HEMOSTASIS) ×2 IMPLANT
STRIP CLOSURE SKIN 1/2X4 (GAUZE/BANDAGES/DRESSINGS) ×2 IMPLANT
SUT VIC AB 0 CT1 18XCR BRD8 (SUTURE) ×1 IMPLANT
SUT VIC AB 0 CT1 8-18 (SUTURE) ×1
SUT VIC AB 2-0 CP2 18 (SUTURE) ×2 IMPLANT
SUT VIC AB 3-0 SH 8-18 (SUTURE) ×2 IMPLANT
TOWEL GREEN STERILE (TOWEL DISPOSABLE) ×2 IMPLANT
TOWEL GREEN STERILE FF (TOWEL DISPOSABLE) ×2 IMPLANT
WATER STERILE IRR 1000ML POUR (IV SOLUTION) ×2 IMPLANT

## 2016-05-04 NOTE — H&P (Signed)
Subjective: Patient is a 76 y.o. male admitted for back and R leg pain. Onset of symptoms was several months ago, rapidly worsening since that time.  The pain is rated severe, and is located at the across the lower back and radiates to R leg. The pain is described as aching and occurs intermittently. The symptoms have been progressive. Symptoms are exacerbated by exercise. MRI or CT showed stenosis   Past Medical History:  Diagnosis Date  . Anticoagulant long-term use   . Aortic root enlargement ([place])   . Ascending aortic aneurysm [facility])    recent scan in October 2012 showing no change; followed by Dr. Servando Snare  . ASCVD (arteriosclerotic cardiovascular disease)    Prior BMS to the 2nd OM in September 2012; with repeat cath in October showing patency  . CAD (coronary artery disease)    a. s/p BMS to 2nd OM in Sept 2012; b. LexiScan Myoview (12/2012):  Inf infarct; bowel and motion artifact make study difficult to interpret; no ischemia; not gated; Low Risk  . CHF (congestive heart failure) ([place])    no recent issues 10/13/14  . Colonic polyp   . Contact lens/glasses fitting   . Diabetes mellitus without complication (HCC)    metphormin, average 154 dx 2017  . Diastolic dysfunction   . Generalized headaches    neck stenosis  . GERD (gastroesophageal reflux disease)   . Hearing loss   . Hearing loss    more so on left  . Hemorrhoids   . Hypertension   . IBS (irritable bowel syndrome)   . LVH (left ventricular hypertrophy)   . Mild intermittent asthma   . OA (osteoarthritis)   . Obesities, morbid ([place])   . OSA (obstructive sleep apnea)    PSG 03/30/97 AHI 21, BPAP 13/9  . PAF (paroxysmal atrial fibrillation) ([place])    treated with Coumadin  . Pneumonia   . Prostate CA [facility])    Oncologist  DR. Daralene Milch [facility] dx 09/24/14, undetermined tx   prostate  . Pulmonary embolism ([place])    2008  . Sleep apnea   . SOB (shortness of breath)    on excertion  . Thoracic aortic aneurysm (HCC)     Aortic Size Index=     5.0    /Body surface area is 2.43 meters squared. = 2.05  < 2.75 cm/m2      4% risk per year 2.75 to 4.25          8% risk per year > 4.25 cm/m2    20% risk per year   Stable aneurysmal dilation of the ascending aorta with maximum AP diameter of 4.8 cm. Stable area of narrowing of the proximal most portion of the descending aorta measuring 2 cm., previously identified as an area of coarctation. No evidence of aortic dissection.  Coronary artery disease.  Normal appearance of the lungs.   Electronically Signed   By: Fidela Salisbury M.D.   On: 10/01/2014 08:50      Past Surgical History:  Procedure Laterality Date  . ACHILLES TENDON REPAIR    . APPENDECTOMY    . CARDIAC CATHETERIZATION  2006  . CARDIAC CATHETERIZATION  October 2012   Stent patent  . CARPAL TUNNEL RELEASE     LEFT  . CERVICAL SPINE SURGERY  06/02/2010   lower back and neck  . CHOLECYSTECTOMY    . COLONOSCOPY WITH PROPOFOL N/A 12/29/2014   Procedure: COLONOSCOPY WITH PROPOFOL;  Surgeon: Garlan Fair, MD;  Location: [facility]  END[facility];  Service: Endoscopy;  Laterality: N/A;  . CORONARY STENT PLACEMENT  Sept 2012   2nd OM with BMS  . EYE SURGERY     bilateral cataract  . HEMORROIDECTOMY    . LAMINECTOMY  05/30/2012   L 4 L5  . LAPAROSCOPIC GASTRIC BANDING    . LEFT AND RIGHT HEART CATHETERIZATION WITH CORONARY ANGIOGRAM N/A 05/07/2014   Procedure: LEFT AND RIGHT HEART CATHETERIZATION WITH CORONARY ANGIOGRAM;  Surgeon: [name] Martinique, MD;  Location: [facility] CATH LAB;  Service: Cardiovascular;  Laterality: N/A;  . ROTATOR CUFF REPAIR     both  . TONSILLECTOMY    . TRIGGER FINGER RELEASE     LEFT  . UVULOPALATOPHARYNGOPLASTY    . VASECTOMY      Prior to Admission medications   Medication Sig Start Date End Date Taking? Authorizing Provider  acetaminophen (TYLENOL) 500 MG tablet Take 1,000 mg by mouth every 8 (eight) hours as needed for mild pain.    Yes Historical Provider, MD  atenolol (TENORMIN) 25 MG  tablet Take 25 mg by mouth every morning.    Yes Historical Provider, MD  atorvastatin (LIPITOR) 10 MG tablet Take 10 mg by mouth daily at 6 PM.    Yes Historical Provider, MD  azelastine (ASTELIN) 0.1 % nasal spray Place 2 sprays into both nostrils daily. Use in each nostril as directed 04/27/16  Yes Tammy S Parrett, NP  budesonide-formoterol (SYMBICORT) 160-4.5 MCG/ACT inhaler Inhale 2 puffs into the lungs 2 (two) times daily. Patient taking differently: Inhale 2 puffs into the lungs 2 (two) times daily as needed (SHORTNESS OF BREATH).  08/13/15  Yes Chesley Mires, MD  celecoxib (CELEBREX) 200 MG capsule Take 200 mg by mouth 2 (two) times daily.   Yes Historical Provider, MD  Cholecalciferol (VITAMIN D-3) 5000 units TABS Take 5,000 Units by mouth daily.   Yes Historical Provider, MD  dicyclomine (BENTYL) 20 MG tablet Take 20 mg by mouth 2 (two) times daily as needed for spasms.   Yes Historical Provider, MD  doxycycline (VIBRA-TABS) 100 MG tablet Take 1 tablet (100 mg total) by mouth 2 (two) times daily. 04/28/16  Yes Tammy S Parrett, NP  fluticasone (FLONASE) 50 MCG/ACT nasal spray Place 2 sprays into both nostrils daily. 04/27/16  Yes Tammy S Parrett, NP  furosemide (LASIX) 80 MG tablet Take 1 tablet (80 mg total) by mouth daily. Take 80 mg in the morning and 40 mg in the afternoon. Patient taking differently: Take 40-80 mg by mouth daily. Take 80 mg in the morning and 40 mg in the afternoon. 05/07/14  Yes [name] Martinique, MD  losartan (COZAAR) 25 MG tablet Take 1 tablet (25 mg total) by mouth daily. Patient taking differently: Take 25 mg by mouth at bedtime.  10/22/15 05/04/16 Yes [name] Martinique, MD  metFORMIN (GLUCOPHAGE-XR) 500 MG 24 hr tablet Take 500 mg by mouth daily before supper.   Yes Historical Provider, MD  montelukast (SINGULAIR) 10 MG tablet Take 10 mg by mouth at bedtime.   Yes Historical Provider, MD  nitroGLYCERIN (NITROSTAT) 0.4 MG SL tablet Place 1 tablet (0.4 mg total) under the tongue  every 5 (five) minutes as needed for chest pain. 10/01/14  Yes [name] Martinique, MD  omeprazole (PRILOSEC) 20 MG capsule Take 20 mg by mouth daily before breakfast.   Yes Historical Provider, MD  potassium chloride SA (KLOR-CON M20) 20 MEQ tablet Take 2 tablets (40 mEq total) by mouth 2 (two) times daily. 03/22/16  Yes [name] Martinique, MD  Pyridoxine HCl (B-6 PO) Take 2 tablets by mouth as needed.    Yes Historical Provider, MD  rivaroxaban (XARELTO) 20 MG TABS tablet Take 20 mg by mouth daily with supper.    Yes Historical Provider, MD  Skin Protectants, Misc. (EUCERIN) cream Apply 1 application topically as needed for dry skin.   Yes Historical Provider, MD  spironolactone (ALDACTONE) 25 MG tablet Take 25 mg by mouth at bedtime.  12/27/12  Yes Historical Provider, MD  tamsulosin (FLOMAX) 0.4 MG CAPS Take 0.4 mg by mouth every evening.    Yes Historical Provider, MD  topiramate (TOPAMAX) 25 MG capsule Take 50 mg by mouth 2 (two) times daily.    Yes Historical Provider, MD  traMADol (ULTRAM) 50 MG tablet Take 50 mg by mouth every 12 (twelve) hours as needed for moderate pain.  09/22/14  Yes Historical Provider, MD  albuterol (PROAIR HFA) 108 (90 Base) MCG/ACT inhaler 2 puffs every 4 hours as needed only  if your can't catch your breath Patient taking differently: Inhale 2 puffs into the lungs every 4 (four) hours as needed for wheezing or shortness of breath.  04/22/15   Tanda Rockers, MD  Azelastine-Fluticasone [phone] MCG/ACT SUSP Place 2 sprays into both nostrils daily. 03/30/16   Tammy S Parrett, NP  HYDROcodone-homatropine (HYDROMET) 5-1.5 MG/5ML syrup Take 5 mLs by mouth every 6 (six) hours as needed. 03/30/16   Tammy S Parrett, NP  methocarbamol (ROBAXIN) 500 MG tablet Take 1 tablet (500 mg total) by mouth 2 (two) times daily as needed for muscle spasms. 03/15/16   Noemi Chapel, MD  sildenafil (VIAGRA) 100 MG tablet Take 100 mg by mouth as needed for erectile dysfunction.  05/23/10   Historical Provider, MD    Allergies  Allergen Reactions  . Adhesive [Tape] Itching and Rash  . Latex Itching and Rash    Bandaids  . Morphine Itching    Social History  Substance Use Topics  . Smoking status: Former Smoker    Packs/day: 1.50    Years: 30.00    Quit date: 01/24/1992  . Smokeless tobacco: Never Used     Comment: Stopped in 1983  . Alcohol use No    Family History  Problem Relation Age of Onset  . Heart disease Mother   . Diabetes Mother   . Other Mother     stent placement  . Emphysema Father 45  . Heart attack Sister      Review of Systems  Positive ROS: neg  All other systems have been reviewed and were otherwise negative with the exception of those mentioned in the HPI and as above.  Objective: Vital signs in last 24 hours: Temp:  [98.3 F (36.8 C)] 98.3 F (36.8 C) (04/12 1124) Pulse Rate:  [61-62] 61 (04/12 1124) Resp:  [18] 18 (04/12 1124) BP: (98-109)/(62-71) 109/71 (04/12 1124) SpO2:  [95 %-96 %] 96 % (04/12 1124) Weight:  [123.8 kg (273 lb)] 123.8 kg (273 lb) (04/11 1613)  General Appearance: Alert, cooperative, no distress, appears stated age Head: Normocephalic, without obvious abnormality, atraumatic Eyes: PERRL, conjunctiva/corneas clear, EOM's intact    Neck: Supple, symmetrical, trachea midline Back: Symmetric, no curvature, ROM normal, no CVA tenderness Lungs:  respirations unlabored Heart: Regular rate and rhythm Abdomen: Soft, non-tender Extremities: Extremities normal, atraumatic, no cyanosis or edema Pulses: 2+ and symmetric all extremities Skin: Skin color, texture, turgor normal, no rashes or lesions  NEUROLOGIC:   Mental status: Alert  and oriented x4,  no aphasia, good attention span, fund of knowledge, and memory Motor Exam - grossly normal Sensory Exam - grossly normal Reflexes: absent Coordination - grossly normal Gait - grossly normal Balance - grossly normal Cranial Nerves: I: smell Not tested  II: visual acuity  OS: nl    OD: nl   II: visual fields Full to confrontation  II: pupils Equal, round, reactive to light  III,VII: ptosis None  III,IV,VI: extraocular muscles  Full ROM  V: mastication Normal  V: facial light touch sensation  Normal  V,VII: corneal reflex  Present  VII: facial muscle function - upper  Normal  VII: facial muscle function - lower Normal  VIII: hearing Not tested  IX: soft palate elevation  Normal  IX,X: gag reflex Present  XI: trapezius strength  5/5  XI: sternocleidomastoid strength 5/5  XI: neck flexion strength  5/5  XII: tongue strength  Normal    Data Review Lab Results  Component Value Date   WBC 6.3 04/21/2016   HGB 12.8 (L) 04/21/2016   HCT 39.5 04/21/2016   MCV 91.2 04/21/2016   PLT 135 (L) 04/21/2016   Lab Results  Component Value Date   NA 139 04/21/2016   K 3.9 04/21/2016   CL 106 04/21/2016   CO2 26 04/21/2016   BUN 19 04/21/2016   CREATININE 1.03 04/21/2016   GLUCOSE 163 (H) 04/21/2016   Lab Results  Component Value Date   INR 1.15 05/04/2016    Assessment/Plan: Patient admitted for T12- L1 and L1-2 lami for stenosis. Patient has failed a reasonable attempt at conservative therapy.  I explained the condition and procedure to the patient and answered any questions.  Patient wishes to proceed with procedure as planned. Understands risks/ benefits and typical outcomes of procedure.   Nocole Zammit S 05/04/2016 1:23 PM

## 2016-05-04 NOTE — Op Note (Signed)
05/04/2016  3:54 PM  PATIENT:  Carlos Dawson  76 y.o. male  PRE-OPERATIVE DIAGNOSIS:  Thoracic and lumbar spinal stenosis T12-L1 and L1-L2, retrolisthesis L1 on L2, back and right leg pain  POST-OPERATIVE DIAGNOSIS:  same  PROCEDURE:  1. Decompressive laminectomy, medial facetectomy and foraminotomies at T12-L1 and L1-L2, 2. Posterior lateral fusion L1-2 utilizing locally harvested morselized autologous bone graft  SURGEON:  Sherley Bounds, MD  ASSISTANTS: Dr Saintclair Halsted  ANESTHESIA:   General  EBL: 150 ml  Total I/O In: 1000 [I.V.:1000] Out: 150 [Blood:150]  BLOOD ADMINISTERED: none  DRAINS: none  SPECIMEN:  none  INDICATION FOR PROCEDURE: This patient presented with back and right leg pain. Imaging showed significant retrolisthesis of L1 on L2 degenerative disc disease and spinal stenosis with right lateral recess stenosis T12-L1. The patient tried conservative measures without relief. Pain was debilitating. Recommended decompressive laminectomy and posterior lateral non-instrumented fusion. I recommended fusion because of the significant retrolisthesis and the chance of destabilization with full laminectomy at that level. Patient understood the risks, benefits, and alternatives and potential outcomes and wished to proceed.  PROCEDURE DETAILS: The patient was taken to the operating room and after induction of adequate generalized endotracheal anesthesia, the patient was rolled into the prone position on the Wilson frame and all pressure points were padded. The lumbar region was cleaned and then prepped with DuraPrep and draped in the usual sterile fashion. 5 cc of local anesthesia was injected and then a dorsal midline incision was made and carried down to the lumbo sacral fascia. The fascia was opened and the paraspinous musculature was taken down in a subperiosteal fashion to expose T12-L1 and L1-L2. Intraoperative x-ray confirmed my level, and then I used a Leksell rongeur to remove the  spinous process of T12 and L1 and then a combination of the high-speed drill and the Kerrison punches to perform a laminectomy, medial facetectomy, and foraminotomy at T12-L1 and L1-L2 bilaterally but pain more tension to the right-hand side because of his right-sided symptoms. The underlying yellow ligament was opened and removed in a piecemeal fashion to expose the underlying dura and exiting nerve root. I undercut the lateral recess and dissected down until I was medial to and distal to the pedicle. The nerve root was well decompressed and thecal sac. . I then palpated with a coronary dilator along the nerve root and into the foramen to assure adequate decompression. I felt no more compression of the nerve root. I exposed the transverse processes of L1 and L2 decorticated these and then placed the bone had removed into the posterior lateral space to perform arthrodesis. I irrigated with saline solution containing bacitracin. Achieved hemostasis with bipolar cautery, lined the dura with Gelfoam, and then closed the fascia with 0 Vicryl. I closed the subcutaneous tissues with 2-0 Vicryl and the subcuticular tissues with 3-0 Vicryl. The skin was then closed with Dermabond. The drapes were removed. The patient was awakened from general anesthesia and transferred to the recovery room in stable condition. At the end of the procedure all sponge, needle and instrument counts were correct.    PLAN OF CARE: Admit to inpatient   PATIENT DISPOSITION:  PACU - hemodynamically stable.   Delay start of Pharmacological VTE agent (>24hrs) due to surgical blood loss or risk of bleeding:  yes

## 2016-05-04 NOTE — Anesthesia Procedure Notes (Signed)
Procedure Name: Intubation Date/Time: 05/04/2016 1:45 PM Performed by: Jenne Campus Pre-anesthesia Checklist: Patient identified, Emergency Drugs available, Suction available and Patient being monitored Patient Re-evaluated:Patient Re-evaluated prior to inductionOxygen Delivery Method: Circle System Utilized Preoxygenation: Pre-oxygenation with 100% oxygen Intubation Type: IV induction Ventilation: Mask ventilation without difficulty, Oral airway inserted - appropriate to patient size and Two handed mask ventilation required Grade View: Grade I Tube type: Oral Tube size: 7.5 mm Number of attempts: 1 Airway Equipment and Method: Stylet,  Oral airway and Video-laryngoscopy Placement Confirmation: ETT inserted through vocal cords under direct vision,  positive ETCO2 and breath sounds checked- equal and bilateral Secured at: 22 cm Tube secured with: Tape Dental Injury: Teeth and Oropharynx as per pre-operative assessment  Difficulty Due To: Difficulty was anticipated, Difficult Airway- due to reduced neck mobility and Difficult Airway- due to limited oral opening Future Recommendations: Recommend- induction with short-acting agent, and alternative techniques readily available

## 2016-05-04 NOTE — Anesthesia Preprocedure Evaluation (Signed)
Anesthesia Evaluation  Patient identified by MRN, date of birth, ID band Patient awake    Reviewed: Allergy & Precautions, H&P , NPO status , Patient's Chart, lab work & pertinent test results, reviewed documented beta blocker date and time   Airway Mallampati: III  TM Distance: >3 FB Neck ROM: Full    Dental no notable dental hx. (+) Teeth Intact, Dental Advisory Given   Pulmonary asthma , sleep apnea and Continuous Positive Airway Pressure Ventilation , former smoker, PE   Pulmonary exam normal breath sounds clear to auscultation       Cardiovascular hypertension, Pt. on medications and Pt. on home beta blockers + CAD, + Peripheral Vascular Disease and +CHF   Rhythm:Regular Rate:Normal     Neuro/Psych  Headaches, negative psych ROS   GI/Hepatic Neg liver ROS, GERD  Medicated and Controlled,  Endo/Other  diabetes, Type 2, Oral Hypoglycemic AgentsMorbid obesity  Renal/GU negative Renal ROS  negative genitourinary   Musculoskeletal  (+) Arthritis , Osteoarthritis,    Abdominal   Peds  Hematology negative hematology ROS (+)   Anesthesia Other Findings   Reproductive/Obstetrics negative OB ROS                             Anesthesia Physical Anesthesia Plan  ASA: III  Anesthesia Plan: General   Post-op Pain Management:    Induction: Intravenous  Airway Management Planned: Oral ETT  Additional Equipment:   Intra-op Plan:   Post-operative Plan: Extubation in OR  Informed Consent: I have reviewed the patients History and Physical, chart, labs and discussed the procedure including the risks, benefits and alternatives for the proposed anesthesia with the patient or authorized representative who has indicated his/her understanding and acceptance.   Dental advisory given  Plan Discussed with: CRNA  Anesthesia Plan Comments:         Anesthesia Quick Evaluation

## 2016-05-04 NOTE — Transfer of Care (Signed)
Immediate Anesthesia Transfer of Care Note  Patient: Carlos Dawson  Procedure(s) Performed: Procedure(s): Laminectomy and Foraminotomy - Thoracic twelve-Lumbar one -Posterior Fusion Lumbar one-two (N/A)  Patient Location: PACU  Anesthesia Type:General  Level of Consciousness: awake, oriented and patient cooperative  Airway & Oxygen Therapy: Patient Spontanous Breathing and Patient connected to face mask oxygen  Post-op Assessment: Report given to RN and Post -op Vital signs reviewed and stable  Post vital signs: Reviewed and stable  Last Vitals:  Vitals:   05/04/16 1124 05/04/16 1601  BP: 109/71 123/71  Pulse: 61 81  Resp: 18 16  Temp: 36.8 C     Last Pain:  Vitals:   05/04/16 1159  TempSrc:   PainSc: 4          Complications: No apparent anesthesia complications

## 2016-05-05 ENCOUNTER — Encounter (HOSPITAL_COMMUNITY): Payer: Self-pay | Admitting: Neurological Surgery

## 2016-05-05 DIAGNOSIS — I2510 Atherosclerotic heart disease of native coronary artery without angina pectoris: Secondary | ICD-10-CM | POA: Diagnosis not present

## 2016-05-05 DIAGNOSIS — E119 Type 2 diabetes mellitus without complications: Secondary | ICD-10-CM | POA: Diagnosis not present

## 2016-05-05 DIAGNOSIS — M48061 Spinal stenosis, lumbar region without neurogenic claudication: Secondary | ICD-10-CM | POA: Diagnosis not present

## 2016-05-05 DIAGNOSIS — M4805 Spinal stenosis, thoracolumbar region: Secondary | ICD-10-CM | POA: Diagnosis not present

## 2016-05-05 DIAGNOSIS — I712 Thoracic aortic aneurysm, without rupture: Secondary | ICD-10-CM | POA: Diagnosis not present

## 2016-05-05 DIAGNOSIS — G4733 Obstructive sleep apnea (adult) (pediatric): Secondary | ICD-10-CM | POA: Diagnosis not present

## 2016-05-05 MED ORDER — HYDROCODONE-HOMATROPINE 5-1.5 MG/5ML PO SYRP: 5.0000 mL | ORAL_SOLUTION | Freq: Four times a day (QID) | ORAL | 0 refills | Status: DC | PRN

## 2016-05-05 NOTE — Discharge Summary (Signed)
Physician Discharge Summary  Patient ID: Carlos Dawson MRN: [id number] DOB/AGE: Nov 24, 1940 76 y.o.  Admit date: 05/04/2016 Discharge date: 05/05/2016  Admission Diagnoses: stenosis    Discharge Diagnoses: same   Discharged Condition: good  Hospital Course: The patient was admitted on 05/04/2016 and taken to the operating room where the patient underwent T12-L1, l1-2 lam and on-lay fusion. The patient tolerated the procedure well and was taken to the recovery room and then to the floor in stable condition. The hospital course was routine. There were no complications. The wound remained clean dry and intact. Pt had appropriate back soreness. No complaints of leg pain or new N/T/W. The patient remained afebrile with stable vital signs, and tolerated a regular diet. The patient continued to increase activities, and pain was well controlled with oral pain medications.   Consults: None  Significant Diagnostic Studies:  Results for orders placed or performed during the hospital encounter of 05/04/16  Glucose, capillary  Result Value Ref Range   Glucose-Capillary 145 (H) 65 - 99 mg/dL  Protime-INR  Result Value Ref Range   Prothrombin Time 14.8 11.4 - 15.2 seconds   INR 1.15   Glucose, capillary  Result Value Ref Range   Glucose-Capillary 128 (H) 65 - 99 mg/dL  Glucose, capillary  Result Value Ref Range   Glucose-Capillary 148 (H) 65 - 99 mg/dL   Comment 1 Notify RN     Dg Lumbar Spine 2-3 Views  Result Date: 05/04/2016 CLINICAL DATA:  Laminectomy and foraminotomy, T12-L1. EXAM: LUMBAR SPINE - 2-3 VIEW COMPARISON:  MRI 03/15/2016 FINDINGS: Lumbar numbering as on preoperative MRI. Intraoperative localization shows first a spinal needle over the T12 spinous process. Next a retractor was placed over the L1 spinous process, with probe more superior at the level of the T12-L1 facets. IMPRESSION: Intraoperative localization as described. Electronically Signed   By: Monte Fantasia M.D.    On: 05/04/2016 15:03    Antibiotics:  Anti-infectives    Start     Dose/Rate Route Frequency Ordered Stop   05/04/16 2130  ceFAZolin (ANCEF) IVPB 2g/100 mL premix     2 g 200 mL/hr over 30 Minutes Intravenous Every 8 hours 05/04/16 1822 05/05/16 0650   05/04/16 1409  bacitracin 50,000 Units in sodium chloride irrigation 0.9 % 500 mL irrigation  Status:  Discontinued       As needed 05/04/16 1409 05/04/16 1558   05/04/16 1230  ceFAZolin (ANCEF) 3 g in dextrose 5 % 50 mL IVPB     3 g 130 mL/hr over 30 Minutes Intravenous To [facility] 05/03/16 1031 05/04/16 1404      Discharge Exam: Blood pressure (!) 102/52, pulse 63, temperature 97.7 F (36.5 C), temperature source Oral, resp. rate 16, SpO2 96 %. Neurologic: Grossly normal Incision good  Discharge Medications:   Allergies as of 05/05/2016      Reactions   Adhesive [tape] Itching, Rash   Latex Itching, Rash   Bandaids   Morphine Itching      Medication List    TAKE these medications   acetaminophen 500 MG tablet Commonly known as:  TYLENOL Take 1,000 mg by mouth every 8 (eight) hours as needed for mild pain.   albuterol 108 (90 Base) MCG/ACT inhaler Commonly known as:  PROAIR HFA 2 puffs every 4 hours as needed only  if your can't catch your breath What changed:  how much to take  how to take this  when to take this  reasons to take this  additional instructions   atenolol 25 MG tablet Commonly known as:  TENORMIN Take 25 mg by mouth every morning.   atorvastatin 10 MG tablet Commonly known as:  LIPITOR Take 10 mg by mouth daily at 6 PM.   azelastine 0.1 % nasal spray Commonly known as:  ASTELIN Place 2 sprays into both nostrils daily. Use in each nostril as directed   Azelastine-Fluticasone 137-50 MCG/ACT Susp Place 2 sprays into both nostrils daily.   B-6 PO Take 2 tablets by mouth as needed.   budesonide-formoterol 160-4.5 MCG/ACT inhaler Commonly known as:  SYMBICORT Inhale 2 puffs  into the lungs 2 (two) times daily. What changed:  when to take this  reasons to take this   celecoxib 200 MG capsule Commonly known as:  CELEBREX Take 200 mg by mouth 2 (two) times daily.   dicyclomine 20 MG tablet Commonly known as:  BENTYL Take 20 mg by mouth 2 (two) times daily as needed for spasms.   doxycycline 100 MG tablet Commonly known as:  VIBRA-TABS Take 1 tablet (100 mg total) by mouth 2 (two) times daily.   eucerin cream Apply 1 application topically as needed for dry skin.   fluticasone 50 MCG/ACT nasal spray Commonly known as:  FLONASE Place 2 sprays into both nostrils daily.   furosemide 80 MG tablet Commonly known as:  LASIX Take 1 tablet (80 mg total) by mouth daily. Take 80 mg in the morning and 40 mg in the afternoon. What changed:  how much to take  additional instructions   HYDROcodone-homatropine 5-1.5 MG/5ML syrup Commonly known as:  HYDROMET Take 5 mLs by mouth every 6 (six) hours as needed.   losartan 25 MG tablet Commonly known as:  COZAAR Take 1 tablet (25 mg total) by mouth daily. What changed:  when to take this   metFORMIN 500 MG 24 hr tablet Commonly known as:  GLUCOPHAGE-XR Take 500 mg by mouth daily before supper.   methocarbamol 500 MG tablet Commonly known as:  ROBAXIN Take 1 tablet (500 mg total) by mouth 2 (two) times daily as needed for muscle spasms.   montelukast 10 MG tablet Commonly known as:  SINGULAIR Take 10 mg by mouth at bedtime.   nitroGLYCERIN 0.4 MG SL tablet Commonly known as:  NITROSTAT Place 1 tablet (0.4 mg total) under the tongue every 5 (five) minutes as needed for chest pain.   omeprazole 20 MG capsule Commonly known as:  PRILOSEC Take 20 mg by mouth daily before breakfast.   potassium chloride SA 20 MEQ tablet Commonly known as:  KLOR-CON M20 Take 2 tablets (40 mEq total) by mouth 2 (two) times daily.   rivaroxaban 20 MG Tabs tablet Commonly known as:  XARELTO Take 20 mg by mouth daily  with supper.   sildenafil 100 MG tablet Commonly known as:  VIAGRA Take 100 mg by mouth as needed for erectile dysfunction.   spironolactone 25 MG tablet Commonly known as:  ALDACTONE Take 25 mg by mouth at bedtime.   tamsulosin 0.4 MG Caps capsule Commonly known as:  FLOMAX Take 0.4 mg by mouth every evening.   topiramate 25 MG capsule Commonly known as:  TOPAMAX Take 50 mg by mouth 2 (two) times daily.   traMADol 50 MG tablet Commonly known as:  ULTRAM Take 50 mg by mouth every 12 (twelve) hours as needed for moderate pain.   Vitamin D-3 5000 units Tabs Take 5,000 Units by mouth daily.       Disposition: home  Final Dx: TL lam/ fusion  Discharge Instructions    Call MD for:  difficulty breathing, headache or visual disturbances    Complete by:  As directed    Call MD for:  persistant nausea and vomiting    Complete by:  As directed    Call MD for:  redness, tenderness, or signs of infection (pain, swelling, redness, odor or green/yellow discharge around incision site)    Complete by:  As directed    Call MD for:  severe uncontrolled pain    Complete by:  As directed    Call MD for:  temperature >100.4    Complete by:  As directed    Diet - low sodium heart healthy    Complete by:  As directed    Increase activity slowly    Complete by:  As directed       Follow-up Information    Devun Anna S, MD. Schedule an appointment as soon as possible for a visit in 2 week(s).   Specialty:  Neurosurgery Contact information: [address] [place] [address] [phone]            Signed: Eustace Moore 05/05/2016, 9:38 AM

## 2016-05-05 NOTE — Care Management Note (Signed)
Case Management Note  Patient Details  Name: Carlos Dawson MRN: [id number] Date of Birth: 02/03/1940  Subjective/Objective:                 Patient admitted from home with wife. T12-L1, l1-2 lam and on-lay fusion. The patient tolerated the procedure well and was taken to the recovery room and then to the floor in stable condition. The hospital course was routine. There were no complications. The wound remained clean dry and intact. No CM orders, needs, consult identified at this time.    Action/Plan:  Patient with order to DC to home.    Expected Discharge Date:  05/05/16               Expected Discharge Plan:  Home/Self Care  In-House Referral:     Discharge planning Services  CM Consult  Post Acute Care Choice:    Choice offered to:     DME Arranged:    DME Agency:     HH Arranged:    HH Agency:     Status of Service:  Completed, signed off  If discussed at [employer] of Stay Meetings, dates discussed:    Additional Comments:  Carles Collet, RN 05/05/2016, 10:07 AM

## 2016-05-05 NOTE — Anesthesia Postprocedure Evaluation (Addendum)
Anesthesia Post Note  Patient: JAMICHAEL KNOTTS  Procedure(s) Performed: Procedure(s) (LRB): Laminectomy and Foraminotomy - Thoracic twelve-Lumbar one -Posterior Fusion Lumbar one-two (N/A)  Patient location during evaluation: PACU Anesthesia Type: General Level of consciousness: awake and alert Pain management: pain level controlled Vital Signs Assessment: post-procedure vital signs reviewed and stable Respiratory status: spontaneous breathing, nonlabored ventilation, respiratory function stable and patient connected to nasal cannula oxygen Cardiovascular status: blood pressure returned to baseline and stable Postop Assessment: no signs of nausea or vomiting Anesthetic complications: no       Last Vitals:  Vitals:   05/05/16 0042 05/05/16 0629  BP: (!) 121/42 (!) 102/52  Pulse: 85 63  Resp: 18 16  Temp: 36.8 C 36.5 C    Last Pain:  Vitals:   05/05/16 0706  TempSrc:   PainSc: 3                  Benedicto Capozzi

## 2016-05-05 NOTE — Progress Notes (Signed)
Pt discharge home with spouse. Discharge instructions were reviewed with pt and wife. Pt & wife verbalized understanding.

## 2016-05-12 DIAGNOSIS — Z09 Encounter for follow-up examination after completed treatment for conditions other than malignant neoplasm: Secondary | ICD-10-CM | POA: Diagnosis not present

## 2016-05-17 ENCOUNTER — Encounter: Payer: Self-pay | Admitting: *Deleted

## 2016-05-29 ENCOUNTER — Encounter: Payer: Self-pay | Admitting: Pulmonary Disease

## 2016-05-29 ENCOUNTER — Ambulatory Visit (INDEPENDENT_AMBULATORY_CARE_PROVIDER_SITE_OTHER): Payer: Medicare Other | Admitting: Pulmonary Disease

## 2016-05-29 VITALS — BP 118/62 | HR 84 | Ht 66.0 in | Wt 274.0 lb

## 2016-05-29 DIAGNOSIS — J4530 Mild persistent asthma, uncomplicated: Secondary | ICD-10-CM

## 2016-05-29 DIAGNOSIS — G4733 Obstructive sleep apnea (adult) (pediatric): Secondary | ICD-10-CM

## 2016-05-29 DIAGNOSIS — J9611 Chronic respiratory failure with hypoxia: Secondary | ICD-10-CM

## 2016-05-30 NOTE — Progress Notes (Signed)
Pt brought in for ambulatory oximetry.  He is still recovering from surgery.  Will reschedule when he has fully recovered from surgery.

## 2016-06-02 NOTE — Progress Notes (Signed)
[employer] [employer] PREP Weekly Session   Patient Details  Name: [name] MRN: [id number] Date of Birth: February 25, 1940 Age: 76 y.o. PCP: Josetta Huddle, MD  Vitals:   06/02/16 0855  Weight: 268 lb (121.6 kg)        [facility] [employer] Weekly seesion - 06/02/16 0800      Weekly Session   Topic Discussed Other  fat calories   Classes attended to date 8     Fun things you did since the last meeting:"surgery" Things you are grateful for:"wife and family" Barriers:"back surgery"  Vanita Ingles 06/02/2016, 8:56 AM

## 2016-06-03 NOTE — Progress Notes (Signed)
Carlos Dawson Date of Birth: 02-10-1940  Medical Record #[id number]  History of Present Illness: Carlos Dawson is seen today for follow up CAD and diastolic CHF. He has multiple medical issues including CAD, thoracic aneurysm, history of pulmonary emboli, morbid obesity with prior lap banding, HTN, chronic edema and PAF. He he is on chronic Xarelto. He underwent stenting of the obtuse marginal vessel in September of 2012.  Last CT of the chest September 2017 showed stable 4.6 cm aneurysm. This is followed by Dr. Servando Snare.  Cardiac in April 2016 cath showed no significant CAD. LV function was good. LV filling pressures were high and he had mild pulmonary HTN. Lasix dose was increased.   He is followed by pulmonary for asthma and OSA.  He has been treated for  Prostate CA Gleason stage 7 with RT treatments x 29. Last PSA 0.02. He is still using CPAP and followed by Dr. Halford Chessman and had been participating in pulmonary Rehab. He underwent lumbar surgery for spinal stenosis in April by Dr. Ronnald Ramp.   Over the last 2 days he has noted increased chest congestion and orthopnea. Weight is up 6-7 lbs. He has a cough productive of clear phlegm. He has recently been to the beach and eating out a lot there. Does note increased edema.   Current Outpatient Prescriptions on File Prior to Visit  Medication Sig Dispense Refill  . acetaminophen (TYLENOL) 500 MG tablet Take 1,000 mg by mouth every 8 (eight) hours as needed for mild pain.     Marland Kitchen atenolol (TENORMIN) 25 MG tablet Take 25 mg by mouth every morning.     Marland Kitchen atorvastatin (LIPITOR) 10 MG tablet Take 10 mg by mouth daily at 6 PM.     . azelastine (ASTELIN) 0.1 % nasal spray Place 2 sprays into both nostrils daily. Use in each nostril as directed 30 mL 2  . Azelastine-Fluticasone 137-50 MCG/ACT SUSP Place 2 sprays into both nostrils daily. 23 g 3  . budesonide-formoterol (SYMBICORT) 160-4.5 MCG/ACT inhaler Inhale 2 puffs into the lungs 2 (two) times daily. 1 Inhaler 6  .  celecoxib (CELEBREX) 200 MG capsule Take 200 mg by mouth 2 (two) times daily.    . Cholecalciferol (VITAMIN D-3) 5000 units TABS Take 5,000 Units by mouth daily.    Marland Kitchen dicyclomine (BENTYL) 20 MG tablet Take 20 mg by mouth 2 (two) times daily as needed for spasms.    Marland Kitchen HYDROcodone-homatropine (HYDROMET) 5-1.5 MG/5ML syrup Take 5 mLs by mouth every 6 (six) hours as needed. 240 mL 0  . metFORMIN (GLUCOPHAGE-XR) 500 MG 24 hr tablet Take 500 mg by mouth daily before supper.    . methocarbamol (ROBAXIN) 500 MG tablet Take 1 tablet (500 mg total) by mouth 2 (two) times daily as needed for muscle spasms. 20 tablet 0  . montelukast (SINGULAIR) 10 MG tablet Take 10 mg by mouth at bedtime.    . nitroGLYCERIN (NITROSTAT) 0.4 MG SL tablet Place 1 tablet (0.4 mg total) under the tongue every 5 (five) minutes as needed for chest pain. 25 tablet 6  . omeprazole (PRILOSEC) 20 MG capsule Take 20 mg by mouth daily before breakfast.    . potassium chloride SA (KLOR-CON M20) 20 MEQ tablet Take 2 tablets (40 mEq total) by mouth 2 (two) times daily. 360 tablet 2  . Pyridoxine HCl (B-6 PO) Take 2 tablets by mouth as needed.     . rivaroxaban (XARELTO) 20 MG TABS tablet Take 20 mg by mouth daily with  supper.     . sildenafil (VIAGRA) 100 MG tablet Take 100 mg by mouth as needed for erectile dysfunction.     . Skin Protectants, Misc. (EUCERIN) cream Apply 1 application topically as needed for dry skin.    Marland Kitchen spironolactone (ALDACTONE) 25 MG tablet Take 25 mg by mouth at bedtime.     . tamsulosin (FLOMAX) 0.4 MG CAPS Take 0.4 mg by mouth every evening.     . topiramate (TOPAMAX) 25 MG capsule Take 50 mg by mouth 2 (two) times daily.     . traMADol (ULTRAM) 50 MG tablet Take 50 mg by mouth every 12 (twelve) hours as needed for moderate pain.     Marland Kitchen losartan (COZAAR) 25 MG tablet Take 1 tablet (25 mg total) by mouth daily. 90 tablet 3   No current facility-administered medications on file prior to visit.     Allergies   Allergen Reactions  . Adhesive [Tape] Itching and Rash  . Latex Itching and Rash    Bandaids  . Morphine Itching    Past Medical History:  Diagnosis Date  . Anticoagulant long-term use   . Aortic root enlargement ([place])   . Ascending aortic aneurysm [facility])    recent scan in October 2012 showing no change; followed by Dr. Servando Snare  . ASCVD (arteriosclerotic cardiovascular disease)    Prior BMS to the 2nd OM in September 2012; with repeat cath in October showing patency  . CAD (coronary artery disease)    a. s/p BMS to 2nd OM in Sept 2012; b. LexiScan Myoview (12/2012):  Inf infarct; bowel and motion artifact make study difficult to interpret; no ischemia; not gated; Low Risk  . CHF (congestive heart failure) ([place])    no recent issues 10/13/14  . Colonic polyp   . Contact lens/glasses fitting   . Diabetes mellitus without complication (HCC)    metphormin, average 154 dx 2017  . Diastolic dysfunction   . Generalized headaches    neck stenosis  . GERD (gastroesophageal reflux disease)   . Hearing loss   . Hearing loss    more so on left  . Hemorrhoids   . Hypertension   . IBS (irritable bowel syndrome)   . LVH (left ventricular hypertrophy)   . Mild intermittent asthma   . OA (osteoarthritis)   . Obesities, morbid ([place])   . OSA (obstructive sleep apnea)    PSG 03/30/97 AHI 21, BPAP 13/9  . PAF (paroxysmal atrial fibrillation) ([place])    treated with Coumadin  . Pneumonia   . Prostate CA [facility])    Oncologist  DR. Daralene Milch [facility] dx 09/24/14, undetermined tx   prostate  . Pulmonary embolism ([place])    2008  . Sleep apnea   . SOB (shortness of breath)    on excertion  . Thoracic aortic aneurysm (HCC)    Aortic Size Index=     5.0    /Body surface area is 2.43 meters squared. = 2.05  < 2.75 cm/m2      4% risk per year 2.75 to 4.25          8% risk per year > 4.25 cm/m2    20% risk per year   Stable aneurysmal dilation of the ascending aorta with maximum AP diameter of 4.8 cm. Stable  area of narrowing of the proximal most portion of the descending aorta measuring 2 cm., previously identified as an area of coarctation. No evidence of aortic dissection.  Coronary artery disease.  Normal appearance  of the lungs.   Electronically Signed   By: Fidela Salisbury M.D.   On: 10/01/2014 08:50      Past Surgical History:  Procedure Laterality Date  . ACHILLES TENDON REPAIR    . APPENDECTOMY    . CARDIAC CATHETERIZATION  2006  . CARDIAC CATHETERIZATION  October 2012   Stent patent  . CARPAL TUNNEL RELEASE     LEFT  . CERVICAL SPINE SURGERY  06/02/2010   lower back and neck  . CHOLECYSTECTOMY    . COLONOSCOPY WITH PROPOFOL N/A 12/29/2014   Procedure: COLONOSCOPY WITH PROPOFOL;  Surgeon: Garlan Fair, MD;  Location: [facility] END[facility];  Service: Endoscopy;  Laterality: N/A;  . CORONARY STENT PLACEMENT  Sept 2012   2nd OM with BMS  . EYE SURGERY     bilateral cataract  . HEMORROIDECTOMY    . LAMINECTOMY  05/30/2012   L 4 L5  . LAPAROSCOPIC GASTRIC BANDING    . LEFT AND RIGHT HEART CATHETERIZATION WITH CORONARY ANGIOGRAM N/A 05/07/2014   Procedure: LEFT AND RIGHT HEART CATHETERIZATION WITH CORONARY ANGIOGRAM;  Surgeon: Pierson Vantol M Martinique, MD;  Location: [facility] CATH LAB;  Service: Cardiovascular;  Laterality: N/A;  . LUMBAR LAMINECTOMY/DECOMPRESSION MICRODISCECTOMY N/A 05/04/2016   Procedure: Laminectomy and Foraminotomy - Thoracic twelve-Lumbar one -Posterior Fusion Lumbar one-two;  Surgeon: Eustace Moore, MD;  Location: [place];  Service: Neurosurgery;  Laterality: N/A;  . ROTATOR CUFF REPAIR     both  . TONSILLECTOMY    . TRIGGER FINGER RELEASE     LEFT  . UVULOPALATOPHARYNGOPLASTY    . VASECTOMY      History  Smoking Status  . Former Smoker  . Packs/day: 1.50  . Years: 30.00  . Quit date: 01/24/1992  Smokeless Tobacco  . Never Used    Comment: Stopped in 1983    History  Alcohol Use No    Family History  Problem Relation Age of Onset  . Heart disease Mother   .  Diabetes Mother   . Other Mother        stent placement  . Emphysema Father 48  . Heart attack Sister     Review of Systems: The review of systems is per the HPI. All other systems were reviewed and are negative.  Physical Exam: BP 116/70   Pulse 62   Ht 5\' 6"  (1.676 m)   Wt 279 lb (126.6 kg)   BMI 45.03 kg/m    Patient is a pleasant obese white male no acute distress. He is morbidly obese. Skin is warm and dry. Color is normal.  HEENT is unremarkable. Normocephalic/atraumatic. PERRL. Sclera are nonicteric. Neck is supple. No masses. Hard to assess JVD with thick neck.  Lungs show crackles left base. Cardiac exam shows a regular rate and rhythm. No gallop or murmur. Normal S1-2. Abdomen is obese but soft. Bowel sounds are positive. Extremities with 2+ edema. Gait and ROM are intact. No gross neurologic deficits noted.   LABORATORY DATA:  Lab Results  Component Value Date   WBC 6.3 04/21/2016   HGB 12.8 (L) 04/21/2016   HCT 39.5 04/21/2016   PLT 135 (L) 04/21/2016   GLUCOSE 163 (H) 04/21/2016   CHOL 118 01/07/2013   TRIG 133 01/07/2013   HDL 40 01/07/2013   LDLCALC 51 01/07/2013   ALT 19 03/15/2016   AST 25 03/15/2016   NA 139 04/21/2016   K 3.9 04/21/2016   CL 106 04/21/2016   CREATININE 1.03 04/21/2016   BUN 19  04/21/2016   CO2 26 04/21/2016   TSH 1.29 05/11/2015   INR 1.15 05/04/2016   HGBA1C 7.6 (H) 04/21/2016   Lab Results  Component Value Date   CHOL 118 01/07/2013   Labs from 06/03/15: cholesterol 123, LDL 101, HDL 47, LDL 56. BUN 21, creatinine 1.11.  CLINICAL DATA:  Followup ascending aortic dilatation  EXAM: CT ANGIOGRAPHY CHEST WITH CONTRAST  TECHNIQUE: Multidetector CT imaging of the chest was performed using the standard protocol during bolus administration of intravenous contrast. Multiplanar CT image reconstructions and MIPs were obtained to evaluate the vascular anatomy.  CONTRAST:  75 mL Isovue 370.  Creatinine was obtained on site  at [place] at [address]Results: Creatinine 1.2 mg/dL.  COMPARISON:  09/01/2014  FINDINGS: Cardiovascular: Mild aortic calcifications are noted. Dilatation of the ascending aorta is again identified to 5 cm. Measurements are somewhat limited by patient motion artifact. Sino-tubular junction measures 4.6 cm. The aorta at the level of sinus Valsalva measures 5.5 cm. These measurements are stable from the prior exam. Normal distal tapering is seen. No dissection is identified. Bovine arch is again identified.  Coronary calcifications are seen similar to that seen on the prior exam. The pulmonary artery as visualized is within normal limits.  Mediastinum/Nodes: Thoracic inlet is within normal limits. No significant hilar or mediastinal adenopathy is identified. No axillary adenopathy is seen. The esophagus as visualize is within normal limits.  Lungs/Pleura: Mild dependent atelectatic changes are noted. No focal infiltrate or focal nodules are seen. No sizable effusion or pneumothorax is noted.  Upper Abdomen: Gastric lap band is seen in satisfactory position. The remainder the upper abdomen is within normal limits.  Musculoskeletal: Degenerative changes of the thoracic spine are noted.  Review of the MIP images confirms the above findings.  IMPRESSION: Dilatation of the ascending aorta to 5 cm. Given some slight variation in the imaging technique, this is felt to be relatively stable from the prior exam. Ascending thoracic aortic aneurysm. Recommend semi-annual imaging followup by CTA or MRA and referral to cardiothoracic surgery if not already obtained. This recommendation follows 2010 ACCF/AHA/AATS/ACR/ASA/SCA/SCAI/SIR/STS/SVM Guidelines for the Diagnosis and Management of Patients With Thoracic Aortic Disease. Circulation. 2010; 121: E675-Q492  Otherwise stable appearance of the chest when compared with the prior exam.   Electronically  Signed   By: Inez Catalina M.D.   On: 10/14/2015 14:11  Assessment / Plan:  1. CAD - prior bare-metal stent of the OM in 2012.  Myoview study in Dec 2014 was low risk.  Cath April 2016 showed no obstructive disease. Continue current therapy and risk factor modification.  2. Acute on Chronic Diastolic CHF -  Prior cath showed elevated LV filling pressures and mild pulmonary HTN. Recent increase in edema, weight, orthopnea, dyspnea due to sodium indiscretion.  Needs to restrict sodium intake better. Will increase lasix to 80 mg bid. Monitor weight daily.   3. HTN -  controlled.  4. Morbid obesity with OSA on CPAP  5. Thoracic aneurysm -followed by Dr. Servando Snare annually.  CT September showed stable appearance. Increased risk of surgery due to multiple comorbidities.   6. PAF - now on Xarelto. No sustained episodes.   7. HLD  8. Asthma.  9. Prostate CA s/p RT  10. s/p Surgery for lumbar stenosis.

## 2016-06-06 ENCOUNTER — Ambulatory Visit (INDEPENDENT_AMBULATORY_CARE_PROVIDER_SITE_OTHER): Payer: Medicare Other | Admitting: Cardiology

## 2016-06-06 ENCOUNTER — Encounter: Payer: Self-pay | Admitting: Cardiology

## 2016-06-06 ENCOUNTER — Ambulatory Visit: Payer: Medicare Other | Admitting: Cardiology

## 2016-06-06 VITALS — BP 116/70 | HR 62 | Ht 66.0 in | Wt 279.0 lb

## 2016-06-06 DIAGNOSIS — I2510 Atherosclerotic heart disease of native coronary artery without angina pectoris: Secondary | ICD-10-CM | POA: Diagnosis not present

## 2016-06-06 DIAGNOSIS — I480 Paroxysmal atrial fibrillation: Secondary | ICD-10-CM

## 2016-06-06 DIAGNOSIS — I712 Thoracic aortic aneurysm, without rupture, unspecified: Secondary | ICD-10-CM

## 2016-06-06 DIAGNOSIS — I5033 Acute on chronic diastolic (congestive) heart failure: Secondary | ICD-10-CM

## 2016-06-06 NOTE — Patient Instructions (Addendum)
Increase lasix to 2 tablets twice a day  Continue your other therapy  I will see you in 6 months  Weigh yourself daily. Restrict your sodium.

## 2016-06-07 ENCOUNTER — Telehealth: Payer: Self-pay | Admitting: Cardiology

## 2016-06-07 NOTE — Telephone Encounter (Signed)
New Message     *STAT* If patient is at the pharmacy, call can be transferred to refill team.   1. Which medications need to be refilled? (please list name of each medication and dose if known)  furosemide (LASIX) 40 MG tablet Take 80 mg by mouth 2 (two) times daily.     2. Which pharmacy/location (including street and city if local pharmacy) is medication to be sent to? [facility]   3. Do they need a 30 day or 90 day supply?  30  To cover the increase the Martinique told him to do yesterday

## 2016-06-08 ENCOUNTER — Telehealth: Payer: Self-pay

## 2016-06-08 MED ORDER — FUROSEMIDE 40 MG PO TABS: 80.0000 mg | ORAL_TABLET | Freq: Two times a day (BID) | ORAL | 0 refills | Status: DC

## 2016-06-08 NOTE — Telephone Encounter (Signed)
Per refill request, and Per Dr. Martinique increasing the amount for a few days lasix 30 day supply has been sent to pharmacy.

## 2016-06-08 NOTE — Telephone Encounter (Signed)
This has been done.

## 2016-06-26 NOTE — Addendum Note (Signed)
Addendum  created 06/26/16 1348 by Oleta Mouse, MD   Sign clinical note

## 2016-07-11 DIAGNOSIS — Z1389 Encounter for screening for other disorder: Secondary | ICD-10-CM | POA: Diagnosis not present

## 2016-07-11 DIAGNOSIS — I509 Heart failure, unspecified: Secondary | ICD-10-CM | POA: Diagnosis not present

## 2016-07-11 DIAGNOSIS — G4730 Sleep apnea, unspecified: Secondary | ICD-10-CM | POA: Diagnosis not present

## 2016-07-11 DIAGNOSIS — I480 Paroxysmal atrial fibrillation: Secondary | ICD-10-CM | POA: Diagnosis not present

## 2016-07-11 DIAGNOSIS — I2510 Atherosclerotic heart disease of native coronary artery without angina pectoris: Secondary | ICD-10-CM | POA: Diagnosis not present

## 2016-07-11 DIAGNOSIS — E559 Vitamin D deficiency, unspecified: Secondary | ICD-10-CM | POA: Diagnosis not present

## 2016-07-11 DIAGNOSIS — I10 Essential (primary) hypertension: Secondary | ICD-10-CM | POA: Diagnosis not present

## 2016-07-11 DIAGNOSIS — M791 Myalgia: Secondary | ICD-10-CM | POA: Diagnosis not present

## 2016-07-11 DIAGNOSIS — Z7984 Long term (current) use of oral hypoglycemic drugs: Secondary | ICD-10-CM | POA: Diagnosis not present

## 2016-07-11 DIAGNOSIS — C61 Malignant neoplasm of prostate: Secondary | ICD-10-CM | POA: Diagnosis not present

## 2016-07-11 DIAGNOSIS — E119 Type 2 diabetes mellitus without complications: Secondary | ICD-10-CM | POA: Diagnosis not present

## 2016-07-11 DIAGNOSIS — Z6841 Body Mass Index (BMI) 40.0 and over, adult: Secondary | ICD-10-CM | POA: Diagnosis not present

## 2016-07-11 DIAGNOSIS — Z0001 Encounter for general adult medical examination with abnormal findings: Secondary | ICD-10-CM | POA: Diagnosis not present

## 2016-07-11 DIAGNOSIS — E785 Hyperlipidemia, unspecified: Secondary | ICD-10-CM | POA: Diagnosis not present

## 2016-07-11 DIAGNOSIS — Z79899 Other long term (current) drug therapy: Secondary | ICD-10-CM | POA: Diagnosis not present

## 2016-07-12 ENCOUNTER — Ambulatory Visit (INDEPENDENT_AMBULATORY_CARE_PROVIDER_SITE_OTHER): Payer: Medicare Other | Admitting: Pulmonary Disease

## 2016-07-12 ENCOUNTER — Encounter: Payer: Self-pay | Admitting: Pulmonary Disease

## 2016-07-12 VITALS — BP 118/64 | HR 67 | Ht 66.0 in | Wt 273.2 lb

## 2016-07-12 DIAGNOSIS — Z6841 Body Mass Index (BMI) 40.0 and over, adult: Secondary | ICD-10-CM

## 2016-07-12 DIAGNOSIS — R0609 Other forms of dyspnea: Secondary | ICD-10-CM

## 2016-07-12 DIAGNOSIS — G4733 Obstructive sleep apnea (adult) (pediatric): Secondary | ICD-10-CM | POA: Diagnosis not present

## 2016-07-12 DIAGNOSIS — J4530 Mild persistent asthma, uncomplicated: Secondary | ICD-10-CM | POA: Diagnosis not present

## 2016-07-12 DIAGNOSIS — R0600 Dyspnea, unspecified: Secondary | ICD-10-CM

## 2016-07-12 DIAGNOSIS — I2510 Atherosclerotic heart disease of native coronary artery without angina pectoris: Secondary | ICD-10-CM | POA: Diagnosis not present

## 2016-07-12 NOTE — Patient Instructions (Signed)
Will have home oxygen set up removed  Follow up in 6 months

## 2016-07-12 NOTE — Progress Notes (Signed)
Current Outpatient Prescriptions on File Prior to Visit  Medication Sig  . acetaminophen (TYLENOL) 500 MG tablet Take 1,000 mg by mouth every 8 (eight) hours as needed for mild pain.   Marland Kitchen albuterol (PROVENTIL HFA;VENTOLIN HFA) 108 (90 Base) MCG/ACT inhaler Inhale 2 puffs into the lungs every 4 (four) hours as needed for wheezing or shortness of breath.  Marland Kitchen atenolol (TENORMIN) 25 MG tablet Take 25 mg by mouth every morning.   Marland Kitchen atorvastatin (LIPITOR) 10 MG tablet Take 10 mg by mouth daily at 6 PM.   . budesonide-formoterol (SYMBICORT) 160-4.5 MCG/ACT inhaler Inhale 2 puffs into the lungs 2 (two) times daily.  . celecoxib (CELEBREX) 200 MG capsule Take 200 mg by mouth 2 (two) times daily.  . Cholecalciferol (VITAMIN D-3) 5000 units TABS Take 5,000 Units by mouth daily.  Marland Kitchen dicyclomine (BENTYL) 20 MG tablet Take 20 mg by mouth 2 (two) times daily as needed for spasms.  . furosemide (LASIX) 40 MG tablet Take 2 tablets (80 mg total) by mouth 2 (two) times daily.  Marland Kitchen HYDROcodone-homatropine (HYDROMET) 5-1.5 MG/5ML syrup Take 5 mLs by mouth every 6 (six) hours as needed.  . metFORMIN (GLUCOPHAGE-XR) 500 MG 24 hr tablet Take 500 mg by mouth daily before supper.  . methocarbamol (ROBAXIN) 500 MG tablet Take 1 tablet (500 mg total) by mouth 2 (two) times daily as needed for muscle spasms.  . montelukast (SINGULAIR) 10 MG tablet Take 10 mg by mouth at bedtime.  . nitroGLYCERIN (NITROSTAT) 0.4 MG SL tablet Place 1 tablet (0.4 mg total) under the tongue every 5 (five) minutes as needed for chest pain.  Marland Kitchen omeprazole (PRILOSEC) 20 MG capsule Take 20 mg by mouth daily before breakfast.  . potassium chloride SA (KLOR-CON M20) 20 MEQ tablet Take 2 tablets (40 mEq total) by mouth 2 (two) times daily.  . Pyridoxine HCl (B-6 PO) Take 2 tablets by mouth as needed.   . rivaroxaban (XARELTO) 20 MG TABS tablet Take 20 mg by mouth daily with supper.   . sildenafil (VIAGRA) 100 MG tablet Take 100 mg by mouth as needed for  erectile dysfunction.   . Skin Protectants, Misc. (EUCERIN) cream Apply 1 application topically as needed for dry skin.  Marland Kitchen spironolactone (ALDACTONE) 25 MG tablet Take 25 mg by mouth at bedtime.   . tamsulosin (FLOMAX) 0.4 MG CAPS Take 0.4 mg by mouth every evening.   . topiramate (TOPAMAX) 25 MG capsule Take 50 mg by mouth 2 (two) times daily.   . traMADol (ULTRAM) 50 MG tablet Take 50 mg by mouth every 12 (twelve) hours as needed for moderate pain.   Marland Kitchen losartan (COZAAR) 25 MG tablet Take 1 tablet (25 mg total) by mouth daily.   No current facility-administered medications on file prior to visit.      Chief Complaint  Patient presents with  . Follow-up    1 month follow up for asthma. Pt states he needs a walk test for his oxygen.      Pulmonary tests PFT 12/20/10 >> FEV1 2.13(79%), FEV1% 71, TLC 5.80(100%), DLCO 57%, no BD CT chest 09/14/11 >> Stable dilatation of ascending thoracic aorta 4.7 cm, PA 4.1 cm Spirometry 05/11/15 >> FEV1 1.68 (55%), FEV1% 69 CT angio chest 10/01/14 >> aortic dilation FeNO 08/13/15 >> 13 Room air SpO2 with exertion 08/13/15 >> 87% - improved with 2 liters oxygen  Sleep tests PSG 03/30/97 >> AHI 21 ONO with BPAP and room air 12/12/10 >> Test time 6 hrs 56  min. Basal SpO2 93.1%, low SpO2 86%. Spent 2.8 min with SpO2 < 88%. PSG 10/21/13 >> AHI 40.1, SaO2 80%, PLMI 75.6, 7 beat VT CPAP 12/28/13 >> CPAP 10 cm H2O >> AHI 1.5, +R  ONO with CPAP 08/19/15 >> test time 8 hrs 40 min.  Basal SpO2 92.3%, low SpO2 86%.  Spent 1.3 min with SpO2 < 88%. CPAP 06/11/16 to 07/10/16 >> used on 28 of 30 nights with average 7 hrs 57 min.  Average AHI 2.1 with CPAP 10 cm H2O  Cardiac tests Echo 04/27/14 >> EF 55 to 08%, grade 1 diastolic CHF, dilated aortic root  Past medical history HTN, diastolic dysfx, CAD s/p stent, PAF, HLD, TAA, PE in 2008, HA, GERD, IBS, Obesity s/p gastric bypass in 2009, benign positional vertigo, Prostate cancer  Past surgical history, Family  history, Social history, Allergies reviewed  Vital Signs BP 118/64 (BP Location: Left Arm, Patient Position: Sitting, Cuff Size: Normal)   Pulse 67   Ht 5\' 6"  (1.676 m)   Wt 273 lb 3.2 oz (123.9 kg)   SpO2 93%   BMI 44.10 kg/m   History of Present Illness Carlos Dawson is a 76 y.o. male former smoker with OSA, and asthma.  He had back surgery in April.  He is worried he might need to have additional surgery.  He is having lots of trouble with his breathing.  He gets winded after walking 200 feet or carrying things.    He is not having cough, wheeze, or sputum.  He uses CPAP nightly.  Physical Exam  General - pleasant Eyes - pupils reactive, wears glasses ENT - no sinus tenderness, no oral exudate, no LAN Cardiac - regular, no murmur Chest - no wheeze, rales Abd - soft, non tender Ext - 1+ edema Skin - no rashes Neuro - normal strength Psych - normal mood   Assessment/Plan  Obstructive sleep apnea. - he is compliant with CPAP and reports benefit - continue CPAP 10 cm H2O  Mild, persistent asthma. - continue symbicort, singulair, and prn albuterol  Allergic rhinitis with upper airway cough syndrome. - continue dymista and singulair  Chronic hypoxic respiratory failure. - he did not have significant oxygen desaturation with exertion today - will have his home oxygen set up d/c'ed  Dyspnea on exertion. - likely combination of deconditioning, asthma, and diastolic CHF - encouraged him to maintain his activity level as able  Preop respiratory assessment. - if he needs to have additional back surgery, then he should be okay to proceed with similar recommendations from last surgery   Patient Instructions  Will have home oxygen set up removed  Follow up in 6 months  Time spent 26 minutes  Chesley Mires, MD [facility] Pager:  [phone] 07/12/2016, 2:36 PM

## 2016-08-01 ENCOUNTER — Other Ambulatory Visit: Payer: Self-pay | Admitting: Neurological Surgery

## 2016-08-01 DIAGNOSIS — M48062 Spinal stenosis, lumbar region with neurogenic claudication: Secondary | ICD-10-CM | POA: Diagnosis not present

## 2016-08-01 DIAGNOSIS — Z6841 Body Mass Index (BMI) 40.0 and over, adult: Secondary | ICD-10-CM | POA: Diagnosis not present

## 2016-08-03 ENCOUNTER — Telehealth: Payer: Self-pay | Admitting: *Deleted

## 2016-08-03 NOTE — Telephone Encounter (Signed)
OK to hold Xarelto for 24-48 hours for myelogram  Carlos Bacha Martinique MD, [facility]

## 2016-08-03 NOTE — Telephone Encounter (Signed)
Requesting surgical clearance:  1. Type of surgery: lumbar myelogram  2. Surgeon: N/A  3.Surgical Date:TBD  4. Medications that need to be held: Xarelto for 1 day   5. CAD: Yes  6. I will defer to:  Dr. Martinique    Contact Information: Fax [phone] Attention: Angelita Ingles

## 2016-08-03 NOTE — Telephone Encounter (Signed)
Clearance faxed via Epic.  

## 2016-08-22 ENCOUNTER — Ambulatory Visit
Admission: RE | Admit: 2016-08-22 | Discharge: 2016-08-22 | Disposition: A | Discharge status: Home / Self Care | Payer: Medicare Other | Source: Ambulatory Visit | Attending: Neurological Surgery | Admitting: Neurological Surgery

## 2016-08-22 VITALS — BP 98/57 | HR 54

## 2016-08-22 DIAGNOSIS — Z9889 Other specified postprocedural states: Secondary | ICD-10-CM

## 2016-08-22 DIAGNOSIS — M961 Postlaminectomy syndrome, not elsewhere classified: Secondary | ICD-10-CM

## 2016-08-22 DIAGNOSIS — M48061 Spinal stenosis, lumbar region without neurogenic claudication: Secondary | ICD-10-CM | POA: Diagnosis not present

## 2016-08-22 DIAGNOSIS — M5416 Radiculopathy, lumbar region: Secondary | ICD-10-CM

## 2016-08-22 DIAGNOSIS — M48062 Spinal stenosis, lumbar region with neurogenic claudication: Secondary | ICD-10-CM

## 2016-08-22 MED ORDER — IOPAMIDOL (ISOVUE-M 200) INJECTION 41%
15.0000 mL | Freq: Once | INTRAMUSCULAR | Status: AC
  Administered 2016-08-22: 15 mL via INTRATHECAL

## 2016-08-22 MED ORDER — DIAZEPAM 5 MG PO TABS
5.0000 mg | ORAL_TABLET | Freq: Once | ORAL | Status: AC
  Administered 2016-08-22: 5 mg via ORAL

## 2016-08-22 NOTE — Discharge Instructions (Addendum)
Myelogram Discharge Instructions  1. Go home and rest quietly for the next 24 hours.  It is important to lie flat for the next 24 hours.  Get up only to go to the restroom.  You may lie in the bed or on a couch on your back, your stomach, your left side or your right side.  You may have one pillow under your head.  You may have pillows between your knees while you are on your side or under your knees while you are on your back.  2. DO NOT drive today.  Recline the seat as far back as it will go, while still wearing your seat belt, on the way home.  3. You may get up to go to the bathroom as needed.  You may sit up for 10 minutes to eat.  You may resume your normal diet and medications unless otherwise indicated.  Drink lots of extra fluids today and tomorrow.  4. The incidence of headache, nausea, or vomiting is about 5% (one in 20 patients).  If you develop a headache, lie flat and drink plenty of fluids until the headache goes away.  Caffeinated beverages may be helpful.  If you develop severe nausea and vomiting or a headache that does not go away with flat bed rest, call [phone].  5. You may resume normal activities after your 24 hours of bed rest is over; however, do not exert yourself strongly or do any heavy lifting tomorrow. If when you get up you have a headache when standing, go back to bed and force fluids for another 24 hours.  6. Call your physician for a follow-up appointment.  The results of your myelogram will be sent directly to your physician by the following day.  7. If you have any questions or if complications develop after you arrive home, please call [phone].  Discharge instructions have been explained to the patient.  The patient, or the person responsible for the patient, fully understands these instructions.    Myelogram Discharge Instructions  8. Go home and rest quietly for the next 24 hours.  It is important to lie flat for the next 24 hours.  Get up only to  go to the restroom.  You may lie in the bed or on a couch on your back, your stomach, your left side or your right side.  You may have one pillow under your head.  You may have pillows between your knees while you are on your side or under your knees while you are on your back.  9. DO NOT drive today.  Recline the seat as far back as it will go, while still wearing your seat belt, on the way home.  10. You may get up to go to the bathroom as needed.  You may sit up for 10 minutes to eat.  You may resume your normal diet and medications unless otherwise indicated.  Drink lots of extra fluids today and tomorrow.  11. The incidence of headache, nausea, or vomiting is about 5% (one in 20 patients).  If you develop a headache, lie flat and drink plenty of fluids until the headache goes away.  Caffeinated beverages may be helpful.  If you develop severe nausea and vomiting or a headache that does not go away with flat bed rest, call [phone].  12. You may resume normal activities after your 24 hours of bed rest is over; however, do not exert yourself strongly or do any heavy lifting tomorrow. If  when you get up you have a headache when standing, go back to bed and force fluids for another 24 hours.  13. Call your physician for a follow-up appointment.  The results of your myelogram will be sent directly to your physician by the following day.  14. If you have any questions or if complications develop after you arrive home, please call [phone].  Discharge instructions have been explained to the patient.  The patient, or the person responsible for the patient, fully understands these instructions.    Myelogram Discharge Instructions  15. Go home and rest quietly for the next 24 hours.  It is important to lie flat for the next 24 hours.  Get up only to go to the restroom.  You may lie in the bed or on a couch on your back, your stomach, your left side or your right side.  You may have one pillow  under your head.  You may have pillows between your knees while you are on your side or under your knees while you are on your back.  16. DO NOT drive today.  Recline the seat as far back as it will go, while still wearing your seat belt, on the way home.  17. You may get up to go to the bathroom as needed.  You may sit up for 10 minutes to eat.  You may resume your normal diet and medications unless otherwise indicated.  Drink lots of extra fluids today and tomorrow.  18. The incidence of headache, nausea, or vomiting is about 5% (one in 20 patients).  If you develop a headache, lie flat and drink plenty of fluids until the headache goes away.  Caffeinated beverages may be helpful.  If you develop severe nausea and vomiting or a headache that does not go away with flat bed rest, call [phone].  19. You may resume normal activities after your 24 hours of bed rest is over; however, do not exert yourself strongly or do any heavy lifting tomorrow. If when you get up you have a headache when standing, go back to bed and force fluids for another 24 hours.  20. Call your physician for a follow-up appointment.  The results of your myelogram will be sent directly to your physician by the following day.  21. If you have any questions or if complications develop after you arrive home, please call [phone].  Discharge instructions have been explained to the patient.  The patient, or the person responsible for the patient, fully understands these instructions.     You may resume Xarelto today.  You may resume Tramadol on Wednesday, August 23, 2016 after 9:30 am.

## 2016-08-22 NOTE — Progress Notes (Signed)
Patient states he has been off Xarelto for at least the past day and the Tramadol for at least the past two days.  Brita Romp, RN

## 2016-08-23 DIAGNOSIS — Z8546 Personal history of malignant neoplasm of prostate: Secondary | ICD-10-CM | POA: Diagnosis not present

## 2016-08-23 DIAGNOSIS — C61 Malignant neoplasm of prostate: Secondary | ICD-10-CM | POA: Diagnosis not present

## 2016-08-23 DIAGNOSIS — Z87891 Personal history of nicotine dependence: Secondary | ICD-10-CM | POA: Diagnosis not present

## 2016-08-23 DIAGNOSIS — Z08 Encounter for follow-up examination after completed treatment for malignant neoplasm: Secondary | ICD-10-CM | POA: Diagnosis not present

## 2016-08-23 DIAGNOSIS — Z9221 Personal history of antineoplastic chemotherapy: Secondary | ICD-10-CM | POA: Diagnosis not present

## 2016-08-23 DIAGNOSIS — Z923 Personal history of irradiation: Secondary | ICD-10-CM | POA: Diagnosis not present

## 2016-08-29 DIAGNOSIS — M48062 Spinal stenosis, lumbar region with neurogenic claudication: Secondary | ICD-10-CM | POA: Diagnosis not present

## 2016-09-11 DIAGNOSIS — E119 Type 2 diabetes mellitus without complications: Secondary | ICD-10-CM | POA: Diagnosis not present

## 2016-09-12 ENCOUNTER — Other Ambulatory Visit: Payer: Self-pay | Admitting: Cardiology

## 2016-09-14 ENCOUNTER — Emergency Department (HOSPITAL_COMMUNITY): Payer: Medicare Other

## 2016-09-14 ENCOUNTER — Observation Stay (HOSPITAL_COMMUNITY)
Admission: EM | Admit: 2016-09-14 | Discharge: 2016-09-15 | Disposition: A | Discharge status: Home / Self Care | Payer: Medicare Other | Attending: Cardiology | Admitting: Cardiology

## 2016-09-14 ENCOUNTER — Encounter (HOSPITAL_COMMUNITY): Payer: Self-pay

## 2016-09-14 DIAGNOSIS — I959 Hypotension, unspecified: Secondary | ICD-10-CM

## 2016-09-14 DIAGNOSIS — Z8546 Personal history of malignant neoplasm of prostate: Secondary | ICD-10-CM | POA: Insufficient documentation

## 2016-09-14 DIAGNOSIS — R079 Chest pain, unspecified: Secondary | ICD-10-CM | POA: Diagnosis not present

## 2016-09-14 DIAGNOSIS — Z86711 Personal history of pulmonary embolism: Secondary | ICD-10-CM | POA: Insufficient documentation

## 2016-09-14 DIAGNOSIS — Z6841 Body Mass Index (BMI) 40.0 and over, adult: Secondary | ICD-10-CM | POA: Diagnosis not present

## 2016-09-14 DIAGNOSIS — Z7901 Long term (current) use of anticoagulants: Secondary | ICD-10-CM | POA: Insufficient documentation

## 2016-09-14 DIAGNOSIS — I700 Atherosclerosis of aorta: Secondary | ICD-10-CM | POA: Diagnosis not present

## 2016-09-14 DIAGNOSIS — G8929 Other chronic pain: Secondary | ICD-10-CM | POA: Insufficient documentation

## 2016-09-14 DIAGNOSIS — I4891 Unspecified atrial fibrillation: Secondary | ICD-10-CM | POA: Diagnosis not present

## 2016-09-14 DIAGNOSIS — I2510 Atherosclerotic heart disease of native coronary artery without angina pectoris: Secondary | ICD-10-CM | POA: Diagnosis not present

## 2016-09-14 DIAGNOSIS — M1990 Unspecified osteoarthritis, unspecified site: Secondary | ICD-10-CM | POA: Diagnosis not present

## 2016-09-14 DIAGNOSIS — K589 Irritable bowel syndrome without diarrhea: Secondary | ICD-10-CM | POA: Diagnosis not present

## 2016-09-14 DIAGNOSIS — I509 Heart failure, unspecified: Secondary | ICD-10-CM | POA: Diagnosis not present

## 2016-09-14 DIAGNOSIS — M4316 Spondylolisthesis, lumbar region: Secondary | ICD-10-CM | POA: Diagnosis not present

## 2016-09-14 DIAGNOSIS — K219 Gastro-esophageal reflux disease without esophagitis: Secondary | ICD-10-CM | POA: Diagnosis not present

## 2016-09-14 DIAGNOSIS — E119 Type 2 diabetes mellitus without complications: Secondary | ICD-10-CM | POA: Insufficient documentation

## 2016-09-14 DIAGNOSIS — I110 Hypertensive heart disease with heart failure: Secondary | ICD-10-CM | POA: Insufficient documentation

## 2016-09-14 DIAGNOSIS — Z7984 Long term (current) use of oral hypoglycemic drugs: Secondary | ICD-10-CM | POA: Insufficient documentation

## 2016-09-14 DIAGNOSIS — Z87891 Personal history of nicotine dependence: Secondary | ICD-10-CM | POA: Insufficient documentation

## 2016-09-14 DIAGNOSIS — I9589 Other hypotension: Secondary | ICD-10-CM

## 2016-09-14 DIAGNOSIS — I712 Thoracic aortic aneurysm, without rupture, unspecified: Secondary | ICD-10-CM | POA: Diagnosis present

## 2016-09-14 DIAGNOSIS — M48061 Spinal stenosis, lumbar region without neurogenic claudication: Secondary | ICD-10-CM | POA: Insufficient documentation

## 2016-09-14 DIAGNOSIS — G4733 Obstructive sleep apnea (adult) (pediatric): Secondary | ICD-10-CM | POA: Insufficient documentation

## 2016-09-14 DIAGNOSIS — I480 Paroxysmal atrial fibrillation: Secondary | ICD-10-CM | POA: Diagnosis not present

## 2016-09-14 DIAGNOSIS — Z79899 Other long term (current) drug therapy: Secondary | ICD-10-CM | POA: Insufficient documentation

## 2016-09-14 LAB — BASIC METABOLIC PANEL
Anion gap: 11 (ref 5–15)
BUN: 17 mg/dL (ref 6–20)
CO2: 22 mmol/L (ref 22–32)
Calcium: 8.8 mg/dL — ABNORMAL LOW (ref 8.9–10.3)
Chloride: 102 mmol/L (ref 101–111)
Creatinine, Ser: 1.35 mg/dL — ABNORMAL HIGH (ref 0.61–1.24)
GFR calc Af Amer: 58 mL/min — ABNORMAL LOW (ref >60)
GFR calc non Af Amer: 50 mL/min — ABNORMAL LOW (ref >60)
Glucose, Bld: 233 mg/dL — ABNORMAL HIGH (ref 65–99)
Potassium: 3.7 mmol/L (ref 3.5–5.1)
Sodium: 135 mmol/L (ref 135–145)

## 2016-09-14 LAB — CBC
HCT: 38.6 % — ABNORMAL LOW (ref 39.0–52.0)
Hemoglobin: 12.5 g/dL — ABNORMAL LOW (ref 13.0–17.0)
MCH: 27.7 pg (ref 26.0–34.0)
MCHC: 32.4 g/dL (ref 30.0–36.0)
MCV: 85.6 fL (ref 78.0–100.0)
Platelets: 184 10*3/uL (ref 150–400)
RBC: 4.51 MIL/uL (ref 4.22–5.81)
RDW: 14.5 % (ref 11.5–15.5)
WBC: 9.1 10*3/uL (ref 4.0–10.5)

## 2016-09-14 LAB — APTT: aPTT: 71 seconds — ABNORMAL HIGH (ref 24–36)

## 2016-09-14 LAB — TROPONIN I: Troponin I: <0.03 ng/mL (ref <0.03)

## 2016-09-14 LAB — PROTIME-INR
INR: 2.57
Prothrombin Time: 28.1 seconds — ABNORMAL HIGH (ref 11.4–15.2)

## 2016-09-14 LAB — BRAIN NATRIURETIC PEPTIDE: B Natriuretic Peptide: 476.3 pg/mL — ABNORMAL HIGH (ref 0.0–100.0)

## 2016-09-14 LAB — MAGNESIUM: Magnesium: 2 mg/dL (ref 1.7–2.4)

## 2016-09-14 MED ORDER — IOPAMIDOL (ISOVUE-370) INJECTION 76%
INTRAVENOUS | Status: AC
  Administered 2016-09-15: 100 mL
  Filled 2016-09-14: qty 100

## 2016-09-14 MED ORDER — ETOMIDATE 2 MG/ML IV SOLN
INTRAVENOUS | Status: DC | PRN
  Administered 2016-09-14: 16 mg via INTRAVENOUS

## 2016-09-14 MED ORDER — SODIUM CHLORIDE 0.9 % IV BOLUS (SEPSIS)
500.0000 mL | Freq: Once | INTRAVENOUS | Status: AC
  Administered 2016-09-14: 500 mL via INTRAVENOUS

## 2016-09-14 MED ORDER — ETOMIDATE 2 MG/ML IV SOLN
16.0000 mg | Freq: Once | INTRAVENOUS | Status: DC
  Filled 2016-09-14: qty 10

## 2016-09-14 NOTE — ED Notes (Signed)
Pt placed on ZOL, Per Dr. Mariane Masters.

## 2016-09-14 NOTE — ED Triage Notes (Addendum)
Pt endorses central chest pain x 2 days and tachycardia that began tonight. Pt tachy in triage at 140. Has hx of stint placement and throracic aortic aneurysm. BP stable. Pt also has hx of a-fib and is on xarelto.

## 2016-09-15 ENCOUNTER — Observation Stay (HOSPITAL_COMMUNITY): Payer: Medicare Other

## 2016-09-15 DIAGNOSIS — I480 Paroxysmal atrial fibrillation: Secondary | ICD-10-CM | POA: Diagnosis not present

## 2016-09-15 DIAGNOSIS — R079 Chest pain, unspecified: Secondary | ICD-10-CM | POA: Diagnosis present

## 2016-09-15 DIAGNOSIS — I712 Thoracic aortic aneurysm, without rupture: Secondary | ICD-10-CM | POA: Diagnosis not present

## 2016-09-15 DIAGNOSIS — I959 Hypotension, unspecified: Secondary | ICD-10-CM | POA: Diagnosis not present

## 2016-09-15 LAB — BASIC METABOLIC PANEL
Anion gap: 9 (ref 5–15)
BUN: 18 mg/dL (ref 6–20)
CO2: 24 mmol/L (ref 22–32)
Calcium: 8.6 mg/dL — ABNORMAL LOW (ref 8.9–10.3)
Chloride: 105 mmol/L (ref 101–111)
Creatinine, Ser: 1.13 mg/dL (ref 0.61–1.24)
GFR calc Af Amer: >60 mL/min (ref >60)
GFR calc non Af Amer: >60 mL/min (ref >60)
Glucose, Bld: 140 mg/dL — ABNORMAL HIGH (ref 65–99)
Potassium: 4 mmol/L (ref 3.5–5.1)
Sodium: 138 mmol/L (ref 135–145)

## 2016-09-15 LAB — TROPONIN I: Troponin I: <0.03 ng/mL (ref <0.03)

## 2016-09-15 MED ORDER — PANTOPRAZOLE SODIUM 40 MG PO TBEC: 40.0000 mg | DELAYED_RELEASE_TABLET | Freq: Every day | ORAL | 11 refills | Status: DC

## 2016-09-15 MED ORDER — MONTELUKAST SODIUM 10 MG PO TABS: 10.0000 mg | ORAL_TABLET | Freq: Every day | ORAL | Status: DC

## 2016-09-15 MED ORDER — TRAMADOL HCL 50 MG PO TABS: 50.0000 mg | ORAL_TABLET | Freq: Two times a day (BID) | ORAL | Status: DC | PRN

## 2016-09-15 MED ORDER — ACETAMINOPHEN 500 MG PO TABS: 1000.0000 mg | ORAL_TABLET | Freq: Three times a day (TID) | ORAL | Status: DC | PRN

## 2016-09-15 MED ORDER — ACETAMINOPHEN 325 MG PO TABS: 650.0000 mg | ORAL_TABLET | ORAL | Status: DC | PRN

## 2016-09-15 MED ORDER — METHOCARBAMOL 500 MG PO TABS: 500.0000 mg | ORAL_TABLET | Freq: Two times a day (BID) | ORAL | Status: DC | PRN

## 2016-09-15 MED ORDER — NITROGLYCERIN 0.4 MG SL SUBL: 0.4000 mg | SUBLINGUAL_TABLET | SUBLINGUAL | 5 refills | Status: DC | PRN

## 2016-09-15 MED ORDER — ONDANSETRON HCL 4 MG/2ML IJ SOLN
4.0000 mg | Freq: Four times a day (QID) | INTRAMUSCULAR | Status: DC | PRN
Start: 2016-09-15 — End: 2016-09-15

## 2016-09-15 MED ORDER — PANTOPRAZOLE SODIUM 40 MG PO TBEC
40.0000 mg | DELAYED_RELEASE_TABLET | Freq: Every day | ORAL | Status: DC
  Administered 2016-09-15: 40 mg via ORAL
  Filled 2016-09-15: qty 1

## 2016-09-15 MED ORDER — RIVAROXABAN 20 MG PO TABS: 20.0000 mg | ORAL_TABLET | Freq: Every day | ORAL | Status: DC

## 2016-09-15 MED ORDER — TOPIRAMATE 25 MG PO TABS
50.0000 mg | ORAL_TABLET | Freq: Two times a day (BID) | ORAL | Status: DC
  Administered 2016-09-15: 50 mg via ORAL
  Filled 2016-09-15: qty 2

## 2016-09-15 MED ORDER — MOMETASONE FURO-FORMOTEROL FUM 200-5 MCG/ACT IN AERO
2.0000 | INHALATION_SPRAY | Freq: Two times a day (BID) | RESPIRATORY_TRACT | Status: DC
Start: 2016-09-15 — End: 2016-09-15

## 2016-09-15 MED ORDER — ATORVASTATIN CALCIUM 10 MG PO TABS: 10.0000 mg | ORAL_TABLET | Freq: Every day | ORAL | Status: DC

## 2016-09-15 MED ORDER — TOPIRAMATE 25 MG PO CPSP: 50.0000 mg | ORAL_CAPSULE | Freq: Two times a day (BID) | ORAL | Status: DC

## 2016-09-15 MED ORDER — ALBUTEROL SULFATE (2.5 MG/3ML) 0.083% IN NEBU: 3.0000 mL | INHALATION_SOLUTION | RESPIRATORY_TRACT | Status: DC | PRN

## 2016-09-15 NOTE — ED Provider Notes (Signed)
[facility] DEPT Provider Note   CSN: [id number] Arrival date & time: 09/14/16  2123     History   Chief Complaint Chief Complaint  Patient presents with  . Tachycardia  . Chest Pain    HPI Carlos Dawson is a 76 y.o. male.  HPI Pt comes in with cc of chest pain, palpitations. Pt has hx of TAA, CAD, DM, AF on xarelto and PE. Pt reports that he started having central chest pain 2 days ago. Pt has been having chest tightness, shortness of breath with exertion. Pt had similar symptoms with his coronary artery disease.  Past Medical History:  Diagnosis Date  . Anticoagulant long-term use   . Aortic root enlargement ([place])   . Ascending aortic aneurysm [facility])    recent scan in October 2012 showing no change; followed by Dr. Servando Snare  . ASCVD (arteriosclerotic cardiovascular disease)    Prior BMS to the 2nd OM in September 2012; with repeat cath in October showing patency  . CAD (coronary artery disease)    a. s/p BMS to 2nd OM in Sept 2012; b. LexiScan Myoview (12/2012):  Inf infarct; bowel and motion artifact make study difficult to interpret; no ischemia; not gated; Low Risk  . CHF (congestive heart failure) ([place])    no recent issues 10/13/14  . Colonic polyp   . Contact lens/glasses fitting   . Diabetes mellitus without complication (HCC)    metphormin, average 154 dx 2017  . Diastolic dysfunction   . Generalized headaches    neck stenosis  . GERD (gastroesophageal reflux disease)   . Hearing loss   . Hearing loss    more so on left  . Hemorrhoids   . Hypertension   . IBS (irritable bowel syndrome)   . LVH (left ventricular hypertrophy)   . Mild intermittent asthma   . OA (osteoarthritis)   . Obesities, morbid ([place])   . OSA (obstructive sleep apnea)    PSG 03/30/97 AHI 21, BPAP 13/9  . PAF (paroxysmal atrial fibrillation) ([place])    treated with Coumadin  . Pneumonia   . Prostate CA [facility])    Oncologist  DR. Daralene Milch [facility] dx 09/24/14, undetermined tx    prostate  . Pulmonary embolism ([place])    2008  . Sleep apnea   . SOB (shortness of breath)    on excertion  . Thoracic aortic aneurysm (HCC)    Aortic Size Index=     5.0    /Body surface area is 2.43 meters squared. = 2.05  < 2.75 cm/m2      4% risk per year 2.75 to 4.25          8% risk per year > 4.25 cm/m2    20% risk per year   Stable aneurysmal dilation of the ascending aorta with maximum AP diameter of 4.8 cm. Stable area of narrowing of the proximal most portion of the descending aorta measuring 2 cm., previously identified as an area of coarctation. No evidence of aortic dissection.  Coronary artery disease.  Normal appearance of the lungs.   Electronically Signed   By: Fidela Salisbury M.D.   On: 10/01/2014 08:50      Patient Active Problem List   Diagnosis Date Noted  . S/P lumbar laminectomy 05/04/2016  . Oral candidiasis 03/30/2016  . Pain in right hip 03/29/2016  . Lumbar radiculopathy 03/29/2016  . Post laminectomy syndrome 03/29/2016  . Severe obesity (BMI >= 40) ([place]) 04/26/2015  . Asthma with acute exacerbation  04/22/2015  . CAP (community acquired pneumonia) 08/12/2013  . Encounter for therapeutic drug monitoring 03/03/2013  . Chest pain at rest 01/06/2013  . Chronic diastolic heart failure ([place]) 12/26/2012  . HLD (hyperlipidemia) 12/26/2012  . Allergic rhinitis 12/24/2012  . Long term (current) use of anticoagulants 08/29/2012  . Lapband APL May 2009 10/25/2011  . Hypoxemia 12/08/2010  . Dyspnea 12/08/2010  . Hypertension   . Obesities, morbid ([place])   . Pulmonary embolism ([place])   . Anticoagulant long-term use   . CAD (coronary artery disease)   . Thoracic aortic aneurysm ([place])   . OA (osteoarthritis)   . LVH (left ventricular hypertrophy)   . Paroxysmal atrial fibrillation (HCC)   . Mild persistent asthma, well controlled 10/21/2008  . OSA (obstructive sleep apnea) 08/06/2008    Past Surgical History:  Procedure Laterality Date  . ACHILLES TENDON  REPAIR    . APPENDECTOMY    . CARDIAC CATHETERIZATION  2006  . CARDIAC CATHETERIZATION  October 2012   Stent patent  . CARPAL TUNNEL RELEASE     LEFT  . CERVICAL SPINE SURGERY  06/02/2010   lower back and neck  . CHOLECYSTECTOMY    . COLONOSCOPY WITH PROPOFOL N/A 12/29/2014   Procedure: COLONOSCOPY WITH PROPOFOL;  Surgeon: Garlan Fair, MD;  Location: [facility] ENDOSCOPY;  Service: Endoscopy;  Laterality: N/A;  . CORONARY STENT PLACEMENT  Sept 2012   2nd OM with BMS  . EYE SURGERY     bilateral cataract  . HEMORROIDECTOMY    . LAMINECTOMY  05/30/2012   L 4 L5  . LAPAROSCOPIC GASTRIC BANDING    . LEFT AND RIGHT HEART CATHETERIZATION WITH CORONARY ANGIOGRAM N/A 05/07/2014   Procedure: LEFT AND RIGHT HEART CATHETERIZATION WITH CORONARY ANGIOGRAM;  Surgeon: [name] Martinique, MD;  Location[facility] C[facility] LAB;  Service: Cardiovascular;  Laterality: N/A;  . LUMBAR LAMINECTOMY/DECOMPRESSION MICRODISCECTOMY N/A 05/04/2016   Procedure: Laminectomy and Foraminotomy - Thoracic twelve-Lumbar one -Posterior Fusion Lumbar one-two;  Surgeon: Eustace Moore, MD;  Location: [facility];  Service: Neurosurgery;  Laterality: N/A;  . ROTATOR CUFF REPAIR     both  . TONSILLECTOMY    . TRIGGER FINGER RELEASE     LEFT  . UVULOPALATOPHARYNGOPLASTY    . VASECTOMY         Home Medications    Prior to Admission medications   Medication Sig Start Date End Date Taking? Authorizing Provider  acetaminophen (TYLENOL) 500 MG tablet Take 1,000 mg by mouth every 8 (eight) hours as needed for mild pain.    Yes [provider]  albuterol (PROVENTIL HFA;VENTOLIN HFA) 108 (90 Base) MCG/ACT inhaler Inhale 2 puffs into the lungs every 4 (four) hours as needed for wheezing or shortness of breath.   Yes [provider]  atenolol (TENORMIN) 25 MG tablet Take 25 mg by mouth every morning.    Yes [provider]  atorvastatin (LIPITOR) 10 MG tablet Take 10 mg by mouth daily at 6 PM.    Yes [provider]    celecoxib (CELEBREX) 200 MG capsule Take 200 mg by mouth 2 (two) times daily.   Yes [provider]  Cholecalciferol (VITAMIN D-3) 5000 units TABS Take 5,000 Units by mouth daily.   Yes [provider]  dicyclomine (BENTYL) 20 MG tablet Take 20 mg by mouth 2 (two) times daily as needed for spasms.   Yes [provider]  furosemide (LASIX) 40 MG tablet Take 2 tablets (80 mg total) by mouth  2 (two) times daily. 06/08/16  Yes Martinique, [name], MD  losartan (COZAAR) 25 MG tablet Take 1 tablet (25 mg total) by mouth daily. 10/22/15 09/14/16 Yes Martinique, [name], MD  metFORMIN (GLUCOPHAGE-XR) 500 MG 24 hr tablet Take 500 mg by mouth daily before supper.   Yes [provider]  methocarbamol (ROBAXIN) 500 MG tablet Take 1 tablet (500 mg total) by mouth 2 (two) times daily as needed for muscle spasms. 03/15/16  Yes Noemi Chapel, MD  montelukast (SINGULAIR) 10 MG tablet Take 10 mg by mouth at bedtime.   Yes [provider]  omeprazole (PRILOSEC) 20 MG capsule Take 20 mg by mouth daily before breakfast.   Yes [provider]  potassium chloride SA (KLOR-CON M20) 20 MEQ tablet Take 2 tablets (40 mEq total) by mouth 2 (two) times daily. 03/22/16  Yes Martinique, [name], MD  Pyridoxine HCl (B-6 PO) Take 2 tablets by mouth as needed.    Yes [provider]  rivaroxaban (XARELTO) 20 MG TABS tablet Take 20 mg by mouth daily with supper.    Yes [provider]  Skin Protectants, Misc. (EUCERIN) cream Apply 1 application topically as needed for dry skin.   Yes [provider]  spironolactone (ALDACTONE) 25 MG tablet Take 25 mg by mouth at bedtime.  12/27/12  Yes [provider]  tamsulosin (FLOMAX) 0.4 MG CAPS Take 0.4 mg by mouth every evening.    Yes [provider]  topiramate (TOPAMAX) 25 MG capsule Take 50 mg by mouth 2 (two) times daily.    Yes [provider]  traMADol (ULTRAM) 50 MG tablet Take 50 mg by mouth  every 12 (twelve) hours as needed for moderate pain.  09/22/14  Yes [provider]  budesonide-formoterol (SYMBICORT) 160-4.5 MCG/ACT inhaler Inhale 2 puffs into the lungs 2 (two) times daily. Patient not taking: Reported on 09/14/2016 08/13/15   Chesley Mires, MD  HYDROcodone-homatropine (HYDROMET) 5-1.5 MG/5ML syrup Take 5 mLs by mouth every 6 (six) hours as needed. Patient not taking: Reported on 09/14/2016 05/05/16   Eustace Moore, MD  NITROSTAT 0.4 MG SL tablet PLACE 1 TABLET (0.4 MG TOTAL) UNDER THE TONGUE EVERY 5 (FIVE) MINUTES AS NEEDED FOR CHEST PAIN. 09/12/16   Martinique, [name], MD  sildenafil (VIAGRA) 100 MG tablet Take 100 mg by mouth as needed for erectile dysfunction.  05/23/10   [provider]    Family History Family History  Problem Relation Age of Onset  . Heart disease Mother   . Diabetes Mother   . Other Mother        stent placement  . Emphysema Father 29  . Heart attack Sister     Social History Social History  Substance Use Topics  . Smoking status: Former Smoker    Packs/day: 1.50    Years: 30.00    Quit date: 01/24/1992  . Smokeless tobacco: Never Used     Comment: Stopped in 1983  . Alcohol use No     Allergies   Adhesive [tape]; Latex; and Morphine   Review of Systems Review of Systems  Constitutional: Positive for activity change.  Respiratory: Positive for chest tightness and shortness of breath.   Cardiovascular: Positive for chest pain and palpitations.  Hematological: Bruises/bleeds easily.  All other systems reviewed and are negative.    Physical Exam Updated Vital Signs BP (!) 93/49   Pulse 68   Temp 98 F (36.7 C) (Oral)   Resp 19  Ht 5\' 7"  (1.702 m)   Wt 120.2 kg (265 lb)   SpO2 98%   BMI 41.50 kg/m   Physical Exam  Constitutional: He is oriented to person, place, and time. He appears well-developed.  HENT:  Head: Atraumatic.  Neck: Neck supple.  Cardiovascular: Intact distal pulses.   Murmur  heard. Irregularly irregular, tachycardia  Pulmonary/Chest: Effort normal.  Abdominal: Soft. There is no guarding.  Neurological: He is alert and oriented to person, place, and time. No cranial nerve deficit.  Skin: Skin is warm.  Nursing note and vitals reviewed.    ED Treatments / Results  Labs (all labs ordered are listed, but only abnormal results are displayed) Labs Reviewed  BASIC METABOLIC PANEL - Abnormal; Notable for the following:       Result Value   Glucose, Bld 233 (*)    Creatinine, Ser 1.35 (*)    Calcium 8.8 (*)    GFR calc non Af Amer 50 (*)    GFR calc Af Amer 58 (*)    All other components within normal limits  CBC - Abnormal; Notable for the following:    Hemoglobin 12.5 (*)    HCT 38.6 (*)    All other components within normal limits  PROTIME-INR - Abnormal; Notable for the following:    Prothrombin Time 28.1 (*)    All other components within normal limits  APTT - Abnormal; Notable for the following:    aPTT 71 (*)    All other components within normal limits  BRAIN NATRIURETIC PEPTIDE - Abnormal; Notable for the following:    B Natriuretic Peptide 476.3 (*)    All other components within normal limits  TROPONIN I  MAGNESIUM    EKG  EKG Interpretation  Date/Time:  Thursday September 14 2016 21:29:20 EDT Ventricular Rate:  140 PR Interval:    QRS Duration: 98 QT Interval:  346 QTC Calculation: 528 R Axis:   52 Text Interpretation:  Supraventricular tachycardia Incomplete right bundle branch block T wave abnormality, consider inferior ischemia Abnormal ECG twi in the inferior leads is new Confirmed by Varney Biles [phone]) on 09/14/2016 10:12:23 PM       Radiology Dg Chest Portable 1 View  Result Date: 09/14/2016 CLINICAL DATA:  Central chest pain for 2 days.  Tachycardia. EXAM: PORTABLE CHEST 1 VIEW COMPARISON:  Radiographs 03/30/2016, CT 10/14/2015 FINDINGS: Stable cardiomegaly and tortuous thoracic aorta. Mediastinal contours are  unchanged. No pulmonary edema. Streaky bibasilar atelectasis. No confluent airspace disease. No pleural fluid or pneumothorax. Postsurgical change about the right shoulder. IMPRESSION: Stable cardiomegaly.  Streaky bibasilar atelectasis. Electronically Signed   By: Jeb Levering M.D.   On: 09/14/2016 22:51    Procedures .Sedation Date/Time: 09/15/2016 12:11 AM Performed by: Varney Biles Authorized by: Varney Biles   Consent:    Consent obtained:  Written   Consent given by:  Patient   Risks discussed:  Dysrhythmia, inadequate sedation, prolonged sedation necessitating reversal, prolonged hypoxia resulting in organ damage, respiratory compromise necessitating ventilatory assistance and intubation, nausea and vomiting Indications:    Procedure performed:  Cardioversion   Procedure necessitating sedation performed by:  Physician performing sedation   Intended level of sedation:  Moderate (conscious sedation) Pre-sedation assessment:    Time since last food or drink:  4   ASA classification: class 4 - patient with severe systemic disease that is a constant threat to life     Neck mobility: normal     Mouth opening:  3 or more finger  widths   Mallampati score:  II - soft palate, uvula, fauces visible   Pre-sedation assessments completed and reviewed: airway patency, cardiovascular function, hydration status, mental status, nausea/vomiting, respiratory function and temperature     History of difficult intubation: no     Pre-sedation assessment completed:  09/14/2016 11:00 PM Immediate pre-procedure details:    Reassessment: Patient reassessed immediately prior to procedure     Reviewed: vital signs, relevant labs/tests and NPO status     Verified: bag valve mask available, emergency equipment available, intubation equipment available, IV patency confirmed, oxygen available and suction available   Procedure details (see MAR for exact dosages):    Sedation start time:  09/14/2016 11:20  PM   Preoxygenation:  Nasal cannula   Sedation:  Etomidate   Intra-procedure monitoring:  Blood pressure monitoring, cardiac monitor, continuous capnometry, continuous pulse oximetry, frequent LOC assessments and frequent vital sign checks   Intra-procedure events: none     Sedation end time:  09/14/2016 11:50 PM Post-procedure details:    Post-sedation assessment completed:  09/15/2016 12:14 AM   Attendance: Constant attendance by certified staff until patient recovered     Recovery: Patient returned to pre-procedure baseline     Estimated blood loss (see I/O flowsheets): no     Post-sedation assessments completed and reviewed: airway patency, cardiovascular function, mental status, nausea/vomiting and respiratory function     Patient is stable for discharge or admission: yes     Patient tolerance:  Tolerated well, no immediate complications Comments:     Patient became hypotensive few minutes after the procedure was completed. .Cardioversion Date/Time: 09/15/2016 12:16 AM Performed by: Varney Biles Authorized by: Varney Biles   Consent:    Consent obtained:  Written   Consent given by:  Patient   Risks discussed:  Death and induced arrhythmia   Alternatives discussed:  Rate-control medication Pre-procedure details:    Cardioversion basis:  Emergent   Rhythm:  Atrial fibrillation   Electrode placement:  Anterior-posterior Attempt one:    Cardioversion mode:  Synchronous   Waveform:  Biphasic   Shock (Joules):  200   Shock outcome:  Conversion to normal sinus rhythm Post-procedure details:    Patient status:  Alert   Patient tolerance of procedure:  Tolerated well, no immediate complications     CRITICAL CARE Performed by: Varney Biles   Total critical care time: 48 minutes  Critical care time was exclusive of separately billable procedures and treating other patients.  Critical care was necessary to treat or prevent imminent or life-threatening  deterioration.  Critical care was time spent personally by me on the following activities: development of treatment plan with patient and/or surrogate as well as nursing, discussions with consultants, evaluation of patient's response to treatment, examination of patient, obtaining history from patient or surrogate, ordering and performing treatments and interventions, ordering and review of laboratory studies, ordering and review of radiographic studies, pulse oximetry and re-evaluation of patient's condition.   (including critical care time)  Medications Ordered in ED Medications  etomidate (AMIDATE) injection 16 mg (16 mg Intravenous Not Given 09/14/16 2339)  iopamidol (ISOVUE-370) 76 % injection (not administered)  etomidate (AMIDATE) injection (16 mg Intravenous Given 09/14/16 2316)  sodium chloride 0.9 % bolus 500 mL (0 mLs Intravenous Stopped 09/14/16 2320)     Initial Impression / Assessment and Plan / ED Course  I have reviewed the triage vital signs and the nursing notes.  Pertinent labs & imaging results that were available during my care of  the patient were reviewed by me and considered in my medical decision making (see chart for details).     Pt comes in with cc of chest tightness. Pt also has palpitations and shortness of breath. He has exertional dyspnea. Pt is noted to be in AF with RVR, and essentially he is in symptomatic AF. Unsure what would have triggered his AF. Pt is taking his meds as prescribed and denies any infection like symptoms. I doubt pt is having dissection right now, but if the thoracic artery aneurysm is getting worse - it could be  Provoking the AF with RVR. Notes reviewed, and pt has PAF hx, so it is not typical for him to be in AF.  Pt agreed to getting cardioverted, risk and benefits discussed. Electric cardioversion was successful. Pt's BP however dropped post procedure- which is rather odd. We will give him a bolus. It is possible that the catecholamine  effects were blunted by etomidate. We will give him bolus and reassess. Pt is moving all 4 extremities and  Is ao x 3 post cardioversion.  Cards consulted for admission.  This patients CHA2DS2-VASc Score and unadjusted Ischemic Stroke Rate (% per year) is equal to 6.  Above score calculated as 1 point each if present [CHF, HTN, DM, Vascular=MI/PAD/Aortic Plaque, Age if 65-74, or Male] Above score calculated as 2 points each if present [Age > 75, or Stroke/TIA/TE]    Final Clinical Impressions(s) / ED Diagnoses   Final diagnoses:  Atrial fibrillation with rapid ventricular response ([place])  Other hypotension    New Prescriptions New Prescriptions   No medications on file     Varney Biles, MD 09/15/16 [phone]

## 2016-09-15 NOTE — ED Notes (Signed)
Patient sleeping in bed.  No current distress noted.  Family member going home at this time.

## 2016-09-15 NOTE — ED Notes (Signed)
After getting dressed and ambulating in room pt's BP increased to 125/45.  Burr Medico, PA, made aware. Pt cleared for discharge.

## 2016-09-15 NOTE — Discharge Instructions (Signed)
Atrial Fibrillation Atrial fibrillation is a type of irregular or rapid heartbeat (arrhythmia). In atrial fibrillation, the heart quivers continuously in a chaotic pattern. This occurs when parts of the heart receive disorganized signals that make the heart unable to pump blood normally. This can increase the risk for stroke, heart failure, and other heart-related conditions. There are different types of atrial fibrillation, including:  Paroxysmal atrial fibrillation. This type starts suddenly, and it usually stops on its own shortly after it starts.  Persistent atrial fibrillation. This type often lasts longer than a week. It may stop on its own or with treatment.  Long-lasting persistent atrial fibrillation. This type lasts longer than 12 months.  Permanent atrial fibrillation. This type does not go away.  Talk with your health care provider to learn about the type of atrial fibrillation that you have. What are the causes? This condition is caused by some heart-related conditions or procedures, including:  A heart attack.  Coronary artery disease.  Heart failure.  Heart valve conditions.  High blood pressure.  Inflammation of the sac that surrounds the heart (pericarditis).  Heart surgery.  Certain heart rhythm disorders, such as Wolf-Parkinson-White syndrome.  Other causes include:  Pneumonia.  Obstructive sleep apnea.  Blockage of an artery in the lungs (pulmonary embolism, or PE).  Lung cancer.  Chronic lung disease.  Thyroid problems, especially if the thyroid is overactive (hyperthyroidism).  Caffeine.  Excessive alcohol use or illegal drug use.  Use of some medicines, including certain decongestants and diet pills.  Sometimes, the cause cannot be found. What increases the risk? This condition is more likely to develop in:  People who are older in age.  People who smoke.  People who have diabetes mellitus.  People who are overweight  (obese).  Athletes who exercise vigorously.  What are the signs or symptoms? Symptoms of this condition include:  A feeling that your heart is beating rapidly or irregularly.  A feeling of discomfort or pain in your chest.  Shortness of breath.  Sudden light-headedness or weakness.  Getting tired easily during exercise.  In some cases, there are no symptoms. How is this diagnosed? Your health care provider may be able to detect atrial fibrillation when taking your pulse. If detected, this condition may be diagnosed with:  An electrocardiogram (ECG).  A Holter monitor test that records your heartbeat patterns over a 24-hour period.  Transthoracic echocardiogram (TTE) to evaluate how blood flows through your heart.  Transesophageal echocardiogram (TEE) to view more detailed images of your heart.  A stress test.  Imaging tests, such as a CT scan or chest X-ray.  Blood tests.  How is this treated? The main goals of treatment are to prevent blood clots from forming and to keep your heart beating at a normal rate and rhythm. The type of treatment that you receive depends on many factors, such as your underlying medical conditions and how you feel when you are experiencing atrial fibrillation. This condition may be treated with:  Medicine to slow down the heart rate, bring the heart's rhythm back to normal, or prevent clots from forming.  Electrical cardioversion. This is a procedure that resets your heart's rhythm by delivering a controlled, low-energy shock to the heart through your skin.  Different types of ablation, such as catheter ablation, catheter ablation with pacemaker, or surgical ablation. These procedures destroy the heart tissues that send abnormal signals. When the pacemaker is used, it is placed under your skin to help your heart beat in   a regular rhythm.  Follow these instructions at home:  Take over-the counter and prescription medicines only as told by your  health care provider.  If your health care provider prescribed a blood-thinning medicine (anticoagulant), take it exactly as told. Taking too much blood-thinning medicine can cause bleeding. If you do not take enough blood-thinning medicine, you will not have the protection that you need against stroke and other problems.  Do not use tobacco products, including cigarettes, chewing tobacco, and e-cigarettes. If you need help quitting, ask your health care provider.  If you have obstructive sleep apnea, manage your condition as told by your health care provider.  Do not drink alcohol.  Do not drink beverages that contain caffeine, such as coffee, soda, and tea.  Maintain a healthy weight. Do not use diet pills unless your health care provider approves. Diet pills may make heart problems worse.  Follow diet instructions as told by your health care provider.  Exercise regularly as told by your health care provider.  Keep all follow-up visits as told by your health care provider. This is important. How is this prevented?  Avoid drinking beverages that contain caffeine or alcohol.  Avoid certain medicines, especially medicines that are used for breathing problems.  Avoid certain herbs and herbal medicines, such as those that contain ephedra or ginseng.  Do not use illegal drugs, such as cocaine and amphetamines.  Do not smoke.  Manage your high blood pressure. Contact a health care provider if:  You notice a change in the rate, rhythm, or strength of your heartbeat.  You are taking an anticoagulant and you notice increased bruising.  You tire more easily when you exercise or exert yourself. Get help right away if:  You have chest pain, abdominal pain, sweating, or weakness.  You feel nauseous.  You notice blood in your vomit, bowel movement, or urine.  You have shortness of breath.  You suddenly have swollen feet and ankles.  You feel dizzy.  You have sudden weakness or  numbness of the face, arm, or leg, especially on one side of the body.  You have trouble speaking, trouble understanding, or both (aphasia).  Your face or your eyelid droops on one side. These symptoms may represent a serious problem that is an emergency. Do not wait to see if the symptoms will go away. Get medical help right away. Call your local emergency services (911 in the [place] Do not drive yourself to the hospital. This information is not intended to replace advice given to you by your health care provider. Make sure you discuss any questions you have with your health care provider. Document Released: 01/09/2005 Document Revised: 05/19/2015 Document Reviewed: 05/06/2014 Elsevier Interactive Patient Education  2017 [employer]  

## 2016-09-15 NOTE — Progress Notes (Signed)
Progress Note  Patient Name: Carlos Dawson Date of Encounter: 09/15/2016  Primary Cardiologist: [place]  Subjective   No chest pain, no shortness of breath, no dizziness  Inpatient Medications    Scheduled Meds: . atorvastatin  10 mg Oral q1800  . etomidate  16 mg Intravenous Once  . montelukast  10 mg Oral QHS  . pantoprazole  40 mg Oral Daily  . rivaroxaban  20 mg Oral Q supper  . topiramate  50 mg Oral BID   Continuous Infusions:  PRN Meds: albuterol, etomidate, methocarbamol, ondansetron (ZOFRAN) IV, traMADol   Vital Signs    Vitals:   09/15/16 0310 09/15/16 0330 09/15/16 0400 09/15/16 0600  BP: 99/67 95/62 (!) 95/47 111/69  Pulse: 67 64 64 64  Resp: 17 15 17 18   Temp:      TempSrc:      SpO2: 96% 98% 94% 97%  Weight:      Height:        Intake/Output Summary (Last 24 hours) at 09/15/16 0917 Last data filed at 09/14/16 2320  Gross per 24 hour  Intake              500 ml  Output                0 ml  Net              500 ml   Filed Weights   09/14/16 2139  Weight: 265 lb (120.2 kg)    Telemetry    Sinus rhythm/sinus bradycardia currently. Previous atrial fibrillation - Personally Reviewed  ECG    Atrial fibrillation - Personally Reviewed  Physical Exam   GEN: No acute distress.   Neck: No JVD Cardiac: RRR, no murmurs, rubs, or gallops.  Respiratory: Clear to auscultation bilaterally. GI: Soft, nontender, non-distended  MS: No edema; No deformity. Neuro:  Nonfocal  Psych: Normal affect   Labs    Chemistry Recent Labs Lab 09/14/16 2139 09/15/16 0513  NA 135 138  K 3.7 4.0  CL 102 105  CO2 22 24  GLUCOSE 233* 140*  BUN 17 18  CREATININE 1.35* 1.13  CALCIUM 8.8* 8.6*  GFRNONAA 50* >60  GFRAA 58* >60  ANIONGAP 11 9     Hematology Recent Labs Lab 09/14/16 2139  WBC 9.1  RBC 4.51  HGB 12.5*  HCT 38.6*  MCV 85.6  MCH 27.7  MCHC 32.4  RDW 14.5  PLT 184    Cardiac Enzymes Recent Labs Lab 09/14/16 2139  09/15/16 0513  TROPONINI <0.03 <0.03   No results for input(s): TROPIPOC in the last 168 hours.   BNP Recent Labs Lab 09/14/16 2310  BNP 476.3*     DDimer No results for input(s): DDIMER in the last 168 hours.   Radiology    Dg Chest Portable 1 View  Result Date: 09/14/2016 CLINICAL DATA:  Central chest pain for 2 days.  Tachycardia. EXAM: PORTABLE CHEST 1 VIEW COMPARISON:  Radiographs 03/30/2016, CT 10/14/2015 FINDINGS: Stable cardiomegaly and tortuous thoracic aorta. Mediastinal contours are unchanged. No pulmonary edema. Streaky bibasilar atelectasis. No confluent airspace disease. No pleural fluid or pneumothorax. Postsurgical change about the right shoulder. IMPRESSION: Stable cardiomegaly.  Streaky bibasilar atelectasis. Electronically Signed   By: Jeb Levering M.D.   On: 09/14/2016 22:51   Ct Angio Chest Aorta W And/or Wo Contrast  Result Date: 09/15/2016 CLINICAL DATA:  Chest pain x2 days, follow-up known thoracic aortic aneurysm EXAM: CT ANGIOGRAPHY CHEST WITH CONTRAST TECHNIQUE:  Multidetector CT imaging of the chest was performed using the standard protocol during bolus administration of intravenous contrast. Multiplanar CT image reconstructions and MIPs were obtained to evaluate the vascular anatomy. CONTRAST:  90 mL Isovue 370 IV COMPARISON:  10/14/2015 FINDINGS: Cardiovascular: Preferential opacification of the thoracic aorta. No evidence of aortic dissection. Stable ascending thoracic aortic aneurysm, measuring 5.0 cm (series 5/ image 53). Mild atherosclerotic calcifications. Although not tailored for evaluation of the pulmonary arteries, there is no evidence of pulmonary embolism to the lobar level. Heart is top-normal in size.  No pericardial effusion. Mild coronary atherosclerosis of the LAD and left circumflex. Mediastinum/Nodes: No suspicious mediastinal lymphadenopathy. Visualized thyroid is unremarkable. Lungs/Pleura: No suspicious pulmonary nodules. No focal  consolidation. Mild dependent atelectasis in the bilateral lower lobes. No pleural effusion or pneumothorax. Upper Abdomen: Visualized upper abdomen is notable for mild hepatic steatosis and cholecystectomy clips. Musculoskeletal: Degenerative changes of the visualized thoracolumbar spine. Review of the MIP images confirms the above findings. IMPRESSION: Stable 5.0 cm ascending thoracic aortic aneurysm. No evidence of aortic dissection. Recommend semi-annual imaging followup by CTA or MRA and referral to cardiothoracic surgery if not already obtained. This recommendation follows 2010 ACCF/AHA/AATS/ACR/ASA/SCA/SCAI/SIR/STS/SVM Guidelines for the Diagnosis and Management of Patients With Thoracic Aortic Disease. Circulation. 2010; 121: I325-Q982 No evidence of acute cardiopulmonary disease. Aortic Atherosclerosis (ICD10-I70.0). Electronically Signed   By: Julian Hy M.D.   On: 09/15/2016 01:33    Cardiac Studies   2016 echocardiogram EF 55%. CT scan of chest-stable 5 cm aorta  Patient Profile     76 year old male with paroxysmal atrial fibrillation status post successful cardioversion in the emergency department on chronic anticoagulation with history of thoracic aortic aneurysm, pulmonary emboli, morbid obesity, lap band, hypertension, Xarelto.  Assessment & Plan    Paroxysmal atrial fibrillation  - Multiple risk factors including obesity obstructive sleep apnea  - Successful cardioversion in the emergency department on full anticoagulation with Xarelto.  - Mild hypotension post cardioversion, currently asymptomatic.  Hypotension  - Post cardioversion. Holding spironolactone, Lasix, losartan 25 mg as he is taking at home.  - Reevaluation as outpatient to initiate diuretics and antihypertensive once again.  - He is comfortable, not dizzy, ambulating well. Received IV fluids. Okay for discharge.  Thoracic aneurysm  - Dr. Servando Snare monitoring  - CT scan stable 5 cm.  Okay for  discharge  Signed, Candee Furbish, MD  09/15/2016, 9:17 AM

## 2016-09-15 NOTE — H&P (Addendum)
Cardiology History & Physical    Patient ID: Carlos Dawson MRN: [id number], DOB: 01/01/1941 Date of Encounter: 09/15/2016, 12:35 AM Primary Physician: Josetta Huddle, MD  Chief Complaint: Chest pain   HPI: Carlos Dawson is a 76 y.o. male with history of CAD, thoracic aortic aneurysm , PAF, PE on Xarelto, who presents with chest discomfort for approximately 2 weeks.  The pain is substernal, nonradiating, and is associated primarily with SOB.  He has mild LE edema, for which his diuretics had been uptitrated recently.  He denies pre-syncope or syncope.  Given progression of his symptoms and tachycardia on home monitoring, pt presented to the ED for evaluation.  There, he was found to be in rapid AF, with rates in the 140s.  Initial labs, including troponin were unremarkable, save for mild AKI, with Cr. 1.35.  He had been taking his Xarelto daily without any missed doses.  As such, he was sedated with etomidate and successfully cardioverted.  After DCCV, his Bps were in the 01V systolic, for which he was receiving an NS fluid bolus.  A chest CTA was ordered and pending to evaluate the aorta.  Upon my interview, pt felt well, and his CP had resolved following cardioversion.  He had no other acute complaints.  Past Medical History:  Diagnosis Date  . Anticoagulant long-term use   . Aortic root enlargement ([place])   . Ascending aortic aneurysm [facility])    recent scan in October 2012 showing no change; followed by Dr. Servando Snare  . ASCVD (arteriosclerotic cardiovascular disease)    Prior BMS to the 2nd OM in September 2012; with repeat cath in October showing patency  . CAD (coronary artery disease)    a. s/p BMS to 2nd OM in Sept 2012; b. LexiScan Myoview (12/2012):  Inf infarct; bowel and motion artifact make study difficult to interpret; no ischemia; not gated; Low Risk  . CHF (congestive heart failure) ([place])    no recent issues 10/13/14  . Colonic polyp   . Contact lens/glasses fitting   .  Diabetes mellitus without complication (HCC)    metphormin, average 154 dx 2017  . Diastolic dysfunction   . Generalized headaches    neck stenosis  . GERD (gastroesophageal reflux disease)   . Hearing loss   . Hearing loss    more so on left  . Hemorrhoids   . Hypertension   . IBS (irritable bowel syndrome)   . LVH (left ventricular hypertrophy)   . Mild intermittent asthma   . OA (osteoarthritis)   . Obesities, morbid ([place])   . OSA (obstructive sleep apnea)    PSG 03/30/97 AHI 21, BPAP 13/9  . PAF (paroxysmal atrial fibrillation) ([place])    treated with Coumadin  . Pneumonia   . Prostate CA [facility])    Oncologist  DR. Daralene Milch [facility] dx 09/24/14, undetermined tx   prostate  . Pulmonary embolism ([address])    2008  . Sleep apnea   . SOB (shortness of breath)    on excertion  . Thoracic aortic aneurysm (HCC)    Aortic Size Index=     5.0    /Body surface area is 2.43 meters squared. = 2.05  < 2.75 cm/m2      4% risk per year 2.75 to 4.25          8% risk per year > 4.25 cm/m2    20% risk per year   Stable aneurysmal dilation of the ascending aorta with maximum AP  diameter of 4.8 cm. Stable area of narrowing of the proximal most portion of the descending aorta measuring 2 cm., previously identified as an area of coarctation. No evidence of aortic dissection.  Coronary artery disease.  Normal appearance of the lungs.   Electronically Signed   By: Fidela Salisbury M.D.   On: 10/01/2014 08:50       Surgical History:  Past Surgical History:  Procedure Laterality Date  . ACHILLES TENDON REPAIR    . APPENDECTOMY    . CARDIAC CATHETERIZATION  2006  . CARDIAC CATHETERIZATION  October 2012   Stent patent  . CARPAL TUNNEL RELEASE     LEFT  . CERVICAL SPINE SURGERY  06/02/2010   lower back and neck  . CHOLECYSTECTOMY    . COLONOSCOPY WITH PROPOFOL N/A 12/29/2014   Procedure: COLONOSCOPY WITH PROPOFOL;  Surgeon: Garlan Fair, MD;  Location: [facility];  Service: Endoscopy;  Laterality:  N/A;  . CORONARY STENT PLACEMENT  Sept 2012   2nd OM with BMS  . EYE SURGERY     bilateral cataract  . HEMORROIDECTOMY    . LAMINECTOMY  05/30/2012   L 4 L5  . LAPAROSCOPIC GASTRIC BANDING    . LEFT AND RIGHT HEART CATHETERIZATION WITH CORONARY ANGIOGRAM N/A 05/07/2014   Procedure: LEFT AND RIGHT HEART CATHETERIZATION WITH CORONARY ANGIOGRAM;  Surgeon: [name] Martinique, MD;  Location: [facility] CATH LAB;  Service: Cardiovascular;  Laterality: N/A;  . LUMBAR LAMINECTOMY/DECOMPRESSION MICRODISCECTOMY N/A 05/04/2016   Procedure: Laminectomy and Foraminotomy - Thoracic twelve-Lumbar one -Posterior Fusion Lumbar one-two;  Surgeon: Eustace Moore, MD;  Location: [place];  Service: Neurosurgery;  Laterality: N/A;  . ROTATOR CUFF REPAIR     both  . TONSILLECTOMY    . TRIGGER FINGER RELEASE     LEFT  . UVULOPALATOPHARYNGOPLASTY    . VASECTOMY       Home Meds: Prior to Admission medications   Medication Sig Start Date End Date Taking? Authorizing Provider  acetaminophen (TYLENOL) 500 MG tablet Take 1,000 mg by mouth every 8 (eight) hours as needed for mild pain.    Yes [provider]  albuterol (PROVENTIL HFA;VENTOLIN HFA) 108 (90 Base) MCG/ACT inhaler Inhale 2 puffs into the lungs every 4 (four) hours as needed for wheezing or shortness of breath.   Yes [provider]  atenolol (TENORMIN) 25 MG tablet Take 25 mg by mouth every morning.    Yes [provider]  atorvastatin (LIPITOR) 10 MG tablet Take 10 mg by mouth daily at 6 PM.    Yes [provider]  celecoxib (CELEBREX) 200 MG capsule Take 200 mg by mouth 2 (two) times daily.   Yes [provider]  Cholecalciferol (VITAMIN D-3) 5000 units TABS Take 5,000 Units by mouth daily.   Yes [provider]  dicyclomine (BENTYL) 20 MG tablet Take 20 mg by mouth 2 (two) times daily as needed for spasms.   Yes [provider]  furosemide (LASIX) 40 MG tablet Take 2 tablets (80 mg total) by mouth 2 (two)  times daily. 06/08/16  Yes Martinique, [name], MD  losartan (COZAAR) 25 MG tablet Take 1 tablet (25 mg total) by mouth daily. 10/22/15 09/14/16 Yes Martinique, [name], MD  metFORMIN (GLUCOPHAGE-XR) 500 MG 24 hr tablet Take 500 mg by mouth daily before supper.   Yes [provider]  methocarbamol (ROBAXIN) 500 MG tablet Take 1 tablet (500 mg total) by mouth 2 (two) times daily as needed for muscle  spasms. 03/15/16  Yes Noemi Chapel, MD  montelukast (SINGULAIR) 10 MG tablet Take 10 mg by mouth at bedtime.   Yes [provider]  omeprazole (PRILOSEC) 20 MG capsule Take 20 mg by mouth daily before breakfast.   Yes [provider]  potassium chloride SA (KLOR-CON M20) 20 MEQ tablet Take 2 tablets (40 mEq total) by mouth 2 (two) times daily. 03/22/16  Yes Martinique, [name], MD  Pyridoxine HCl (B-6 PO) Take 2 tablets by mouth as needed.    Yes [provider]  rivaroxaban (XARELTO) 20 MG TABS tablet Take 20 mg by mouth daily with supper.    Yes [provider]  Skin Protectants, Misc. (EUCERIN) cream Apply 1 application topically as needed for dry skin.   Yes [provider]  spironolactone (ALDACTONE) 25 MG tablet Take 25 mg by mouth at bedtime.  12/27/12  Yes [provider]  tamsulosin (FLOMAX) 0.4 MG CAPS Take 0.4 mg by mouth every evening.    Yes [provider]  topiramate (TOPAMAX) 25 MG capsule Take 50 mg by mouth 2 (two) times daily.    Yes [provider]  traMADol (ULTRAM) 50 MG tablet Take 50 mg by mouth every 12 (twelve) hours as needed for moderate pain.  09/22/14  Yes [provider]  budesonide-formoterol (SYMBICORT) 160-4.5 MCG/ACT inhaler Inhale 2 puffs into the lungs 2 (two) times daily. Patient not taking: Reported on 09/14/2016 08/13/15   Chesley Mires, MD  HYDROcodone-homatropine (HYDROMET) 5-1.5 MG/5ML syrup Take 5 mLs by mouth every 6 (six) hours as needed. Patient not taking: Reported on 09/14/2016 05/05/16    Eustace Moore, MD  NITROSTAT 0.4 MG SL tablet PLACE 1 TABLET (0.4 MG TOTAL) UNDER THE TONGUE EVERY 5 (FIVE) MINUTES AS NEEDED FOR CHEST PAIN. 09/12/16   Martinique, [name], MD  sildenafil (VIAGRA) 100 MG tablet Take 100 mg by mouth as needed for erectile dysfunction.  05/23/10   [provider]    Allergies:  Allergies  Allergen Reactions  . Adhesive [Tape] Itching and Rash  . Latex Itching and Rash    Bandaids  . Morphine Itching    Social History   Social History  . Marital status: Married    Spouse name: N/A  . Number of children: 3  . Years of education: N/A   Occupational History  . Retired from [employer]   Social History Main Topics  . Smoking status: Former Smoker    Packs/day: 1.50    Years: 30.00    Quit date: 01/24/1992  . Smokeless tobacco: Never Used     Comment: Stopped in 1983  . Alcohol use No  . Drug use: No  . Sexual activity: Yes   Other Topics Concern  . Not on file   Social History Narrative  . No narrative on file     Family History  Problem Relation Age of Onset  . Heart disease Mother   . Diabetes Mother   . Other Mother        stent placement  . Emphysema Father 68  . Heart attack Sister     Review of Systems: All other systems reviewed and are otherwise negative except as noted above.  Labs:   Lab Results  Component Value Date   WBC 9.1 09/14/2016   HGB 12.5 (L) 09/14/2016   HCT 38.6 (L) 09/14/2016   MCV 85.6 09/14/2016   PLT 184 09/14/2016    Recent Labs Lab 09/14/16 2139  NA  135  K 3.7  CL 102  CO2 22  BUN 17  CREATININE 1.35*  CALCIUM 8.8*  GLUCOSE 233*    Recent Labs  09/14/16 2139  TROPONINI <0.03   Lab Results  Component Value Date   CHOL 118 01/07/2013   HDL 40 01/07/2013   LDLCALC 51 01/07/2013   TRIG 133 01/07/2013   Lab Results  Component Value Date   DDIMER (H) 08/17/2006    6.90        AT THE INHOUSE ESTABLISHED CUTOFF VALUE OF 0.48 ug/mL FEU, THIS ASSAY HAS BEEN DOCUMENTED IN  THE LITERATURE TO HAVE    Radiology/Studies:  Ct Lumbar Spine W Contrast  Result Date: 08/22/2016 CLINICAL DATA:  Low back pain.  BILATERAL leg pain. EXAM: LUMBAR MYELOGRAM FLUOROSCOPY TIME:  60 seconds corresponding to a Dose Area Product of 484.38 Gy*m2 PROCEDURE: After thorough discussion of risks and benefits of the procedure including bleeding, infection, injury to nerves, blood vessels, adjacent structures as well as headache and CSF leak, written and oral informed consent was obtained. Consent was obtained by Dr. Rolla Flatten. Time out form was completed. Patient was positioned prone on the fluoroscopy table. Local anesthesia was provided with 1% lidocaine without epinephrine after prepped and draped in the usual sterile fashion. Puncture was performed at L3-L4 using a 3 1/2 inch 20 gauge spinal needle via midline approach. Using a single pass through the dura, the needle was placed within the thecal sac, with return of clear CSF. 15 mL of Isovue-M 200 was injected into the thecal sac, with normal opacification of the nerve roots and cauda equina consistent with free flow within the subarachnoid space. I personally performed the lumbar puncture and administered the intrathecal contrast. I also personally supervised acquisition of the myelogram images. TECHNIQUE: Contiguous axial images were obtained through the Lumbar spine after the intrathecal infusion of infusion. Coronal and sagittal reconstructions were obtained of the axial image sets. COMPARISON:  MRI lumbar spine 03/15/2016. Lumbar myelogram 03/27/2013. FINDINGS: LUMBAR MYELOGRAM FINDINGS: Good opacification lumbar subarachnoid space. Previous L4-5 PLIF. No impingement at the fusion site. Moderate stenosis at L2-3 with BILATERAL L3 nerve root effacement. Mild stenosis at L1-2, asymmetric loss of interspace height, and retrolisthesis, with BILATERAL L2 nerve root impingement. No significant impingement at L3-4, L4-5, or L5-S1. With the patient  recumbent for myelography, there is 3 mm retrolisthesis L3-4, 2 mm retrolisthesis L2-3, and 5 mm retrolisthesis L1-2. With patient upright, these measurements are unchanged throughout neutral, flexion, and extension. CT LUMBAR MYELOGRAM FINDINGS: Segmentation: Normal. Alignment:  Normal. Vertebrae: No worrisome osseous lesion. Conus medullaris: Normal in size.  Low termination, L2-3. Paraspinal tissues: No evidence for hydronephrosis or paravertebral mass. Aortic atherosclerosis. Gastric banding device. Disc levels: L1-L2: Previous laminectomy. Adequate posterior decompression. 5 mm retrolisthesis. Moderate osseous spurring. Uncovering of the disc. Posterior displacement of the conus, with mild stenosis. BILATERAL subarticular zone and foraminal zone narrowing affect the L1 and L2 nerve roots. L2-L3: Low conus terminating at L2-3. Partial regrowth of posterior elements. 2 mm retrolisthesis. Facet arthropathy. No significant disc pathology. Moderate to severe stenosis. BILATERAL L2 and L3 nerve root impingement. L3-L4: Partial regrowth of posterior elements. Facet arthropathy. 4 mm retrolisthesis. No soft disc protrusion. Mild stenosis. Uncovering of the disc. LEFT greater than RIGHT L4 nerve root impingement. LEFT greater than RIGHT L3 nerve root impingement. L4-L5: Posterior element hypertrophy. Solid posterior arthrodesis. Adequate posterior decompression. Incomplete interbody arthrodesis. No definite subarticular zone narrowing, but RIGHT greater than LEFT foraminal narrowing affecting  the L4 nerve roots. L5-S1: Unremarkable disc space. Facet arthropathy. Adequate posterior decompression. No definite subarticular zone or foraminal zone narrowing. IMPRESSION: LUMBAR MYELOGRAM IMPRESSION: Status post L4-5 PLIF. No residual impingement. There is no movement at this level with flexion extension. Adjacent segment disease at L1-2 and L2-3, with moderate stenosis most prominent at the L2-3 level. Retrolisthesis at L1-2  5 mm, L2-3 of 2 mm, and L3-4 3 mm. No dynamic instability, but the combined effect of retrolisthesis at these three levels is likely to contribute to sagittal imbalance. CT LUMBAR MYELOGRAM IMPRESSION: Moderate to severe stenosis at L2-L3. Partial regrowth of posterior elements with 2 mm retrolisthesis. BILATERAL L2 and L3 nerve root impingement. Similar less severe changes at L1-2, with 5 mm retrolisthesis, uncovering of the disc, osseous spurring, and posterior displacement of the conus. BILATERAL L1 and L2 nerve root impingement. Solid posterior arthrodesis at L4-5. Incomplete adequate interbody arthrodesis. RIGHT greater than LEFT foraminal narrowing affecting the L4 nerve roots. Mild adjacent segment disease at L3-4, with LEFT greater than RIGHT subarticular zone and foraminal zone narrowing. No significant stenosis, but 4 mm of retrolisthesis is observed. Electronically Signed   By: Staci Righter M.D.   On: 08/22/2016 11:36   Dg Chest Portable 1 View  Result Date: 09/14/2016 CLINICAL DATA:  Central chest pain for 2 days.  Tachycardia. EXAM: PORTABLE CHEST 1 VIEW COMPARISON:  Radiographs 03/30/2016, CT 10/14/2015 FINDINGS: Stable cardiomegaly and tortuous thoracic aorta. Mediastinal contours are unchanged. No pulmonary edema. Streaky bibasilar atelectasis. No confluent airspace disease. No pleural fluid or pneumothorax. Postsurgical change about the right shoulder. IMPRESSION: Stable cardiomegaly.  Streaky bibasilar atelectasis. Electronically Signed   By: Jeb Levering M.D.   On: 09/14/2016 22:51   Dg Myelography Lumbar Inj Lumbosacral  Result Date: 08/22/2016 CLINICAL DATA:  Low back pain.  BILATERAL leg pain. EXAM: LUMBAR MYELOGRAM FLUOROSCOPY TIME:  60 seconds corresponding to a Dose Area Product of 484.38 Gy*m2 PROCEDURE: After thorough discussion of risks and benefits of the procedure including bleeding, infection, injury to nerves, blood vessels, adjacent structures as well as headache and  CSF leak, written and oral informed consent was obtained. Consent was obtained by Dr. Rolla Flatten. Time out form was completed. Patient was positioned prone on the fluoroscopy table. Local anesthesia was provided with 1% lidocaine without epinephrine after prepped and draped in the usual sterile fashion. Puncture was performed at L3-L4 using a 3 1/2 inch 20 gauge spinal needle via midline approach. Using a single pass through the dura, the needle was placed within the thecal sac, with return of clear CSF. 15 mL of Isovue-M 200 was injected into the thecal sac, with normal opacification of the nerve roots and cauda equina consistent with free flow within the subarachnoid space. I personally performed the lumbar puncture and administered the intrathecal contrast. I also personally supervised acquisition of the myelogram images. TECHNIQUE: Contiguous axial images were obtained through the Lumbar spine after the intrathecal infusion of infusion. Coronal and sagittal reconstructions were obtained of the axial image sets. COMPARISON:  MRI lumbar spine 03/15/2016. Lumbar myelogram 03/27/2013. FINDINGS: LUMBAR MYELOGRAM FINDINGS: Good opacification lumbar subarachnoid space. Previous L4-5 PLIF. No impingement at the fusion site. Moderate stenosis at L2-3 with BILATERAL L3 nerve root effacement. Mild stenosis at L1-2, asymmetric loss of interspace height, and retrolisthesis, with BILATERAL L2 nerve root impingement. No significant impingement at L3-4, L4-5, or L5-S1. With the patient recumbent for myelography, there is 3 mm retrolisthesis L3-4, 2 mm retrolisthesis L2-3, and 5 mm  retrolisthesis L1-2. With patient upright, these measurements are unchanged throughout neutral, flexion, and extension. CT LUMBAR MYELOGRAM FINDINGS: Segmentation: Normal. Alignment:  Normal. Vertebrae: No worrisome osseous lesion. Conus medullaris: Normal in size.  Low termination, L2-3. Paraspinal tissues: No evidence for hydronephrosis or  paravertebral mass. Aortic atherosclerosis. Gastric banding device. Disc levels: L1-L2: Previous laminectomy. Adequate posterior decompression. 5 mm retrolisthesis. Moderate osseous spurring. Uncovering of the disc. Posterior displacement of the conus, with mild stenosis. BILATERAL subarticular zone and foraminal zone narrowing affect the L1 and L2 nerve roots. L2-L3: Low conus terminating at L2-3. Partial regrowth of posterior elements. 2 mm retrolisthesis. Facet arthropathy. No significant disc pathology. Moderate to severe stenosis. BILATERAL L2 and L3 nerve root impingement. L3-L4: Partial regrowth of posterior elements. Facet arthropathy. 4 mm retrolisthesis. No soft disc protrusion. Mild stenosis. Uncovering of the disc. LEFT greater than RIGHT L4 nerve root impingement. LEFT greater than RIGHT L3 nerve root impingement. L4-L5: Posterior element hypertrophy. Solid posterior arthrodesis. Adequate posterior decompression. Incomplete interbody arthrodesis. No definite subarticular zone narrowing, but RIGHT greater than LEFT foraminal narrowing affecting the L4 nerve roots. L5-S1: Unremarkable disc space. Facet arthropathy. Adequate posterior decompression. No definite subarticular zone or foraminal zone narrowing. IMPRESSION: LUMBAR MYELOGRAM IMPRESSION: Status post L4-5 PLIF. No residual impingement. There is no movement at this level with flexion extension. Adjacent segment disease at L1-2 and L2-3, with moderate stenosis most prominent at the L2-3 level. Retrolisthesis at L1-2 5 mm, L2-3 of 2 mm, and L3-4 3 mm. No dynamic instability, but the combined effect of retrolisthesis at these three levels is likely to contribute to sagittal imbalance. CT LUMBAR MYELOGRAM IMPRESSION: Moderate to severe stenosis at L2-L3. Partial regrowth of posterior elements with 2 mm retrolisthesis. BILATERAL L2 and L3 nerve root impingement. Similar less severe changes at L1-2, with 5 mm retrolisthesis, uncovering of the disc,  osseous spurring, and posterior displacement of the conus. BILATERAL L1 and L2 nerve root impingement. Solid posterior arthrodesis at L4-5. Incomplete adequate interbody arthrodesis. RIGHT greater than LEFT foraminal narrowing affecting the L4 nerve roots. Mild adjacent segment disease at L3-4, with LEFT greater than RIGHT subarticular zone and foraminal zone narrowing. No significant stenosis, but 4 mm of retrolisthesis is observed. Electronically Signed   By: Staci Righter M.D.   On: 08/22/2016 11:36   Wt Readings from Last 3 Encounters:  09/14/16 120.2 kg (265 lb)  07/12/16 123.9 kg (273 lb 3.2 oz)  06/06/16 126.6 kg (279 lb)    EKG (post DCCV): NSR, 1st degree AVB.  Physical Exam: Blood pressure (!) 93/49, pulse 68, temperature 98 F (36.7 C), temperature source Oral, resp. rate 19, height 5\' 7"  (1.702 m), weight 120.2 kg (265 lb), SpO2 98 %. Body mass index is 41.5 kg/m. General: Obese, in no acute distress. Head: Normocephalic, atraumatic, sclera non-icteric, no xanthomas, nares are without discharge.  Neck: Negative for carotid bruits. JVD not elevated. Lungs: Clear bilaterally to auscultation without wheezes, rales, or rhonchi. Breathing is unlabored. Heart: RRR with S1 S2. No murmurs, rubs, or gallops appreciated. Abdomen: Soft, non-tender, non-distended with normoactive bowel sounds. No hepatomegaly. No rebound/guarding. No obvious abdominal masses. Msk:  Strength and tone appear normal for age. Extremities: No clubbing or cyanosis. No edema.  Distal pedal pulses are 2+ and equal bilaterally. Neuro: Alert and oriented X 3. No focal deficit. No facial asymmetry. Moves all extremities spontaneously. Psych:  Responds to questions appropriately with a normal affect.    Assessment and Plan   76 y.o. male with  history of CAD, thoracic aortic aneurysm s/p stenting, PAF, PE on Xarelto, who presents with chest discomfort, found to be in AF with RVR, now s/p successful DCCV in ED.  1.   Chest pain: Likely 2/2 AF with RVR, as Sx resolved following DCCV.  Will cycle one more set of enzymes, and await Chest CTA ordered in the ED.  2.  Hypotension following DCCV:  Related to decreased HR and possibly sedation.  Pt asymptomatic, receiving IVF bolus now.  Will hold all home antihypertensives/diuretics for the time being.  3.  AF/PE history: Continue home Xarelto.  4.  HTN: Holding home antihypertensives as above.  5.  DM2:  Holding home metformin, monitor FSBG, sliding scale coverage if needed.  6.  Chronic back pain:  Continue home agents.  Merrilee Seashore MD 09/15/2016, 12:35 AM

## 2016-09-15 NOTE — Discharge Summary (Signed)
Discharge Summary    Patient ID: Carlos Dawson,  MRN: [id number], DOB/AGE: [id number] y.o.  Admit date: 09/14/2016 Discharge date: 09/15/2016  Primary Care Provider: Josetta Huddle Primary Cardiologist: Dr. Martinique   Discharge Diagnoses    Principal Problem:   Paroxysmal atrial fibrillation with [facility]) Active Problems:   Thoracic aortic aneurysm (HCC)   Chest pain   Hypotension   Allergies Allergies  Allergen Reactions  . Adhesive [Tape] Itching and Rash  . Latex Itching and Rash    Bandaids  . Morphine Itching     History of Present Illness     76 year old male with history of CAD, thoracic aortic aneurysm, PAF, PE on xarelto, morbid obesity-lap band, hypertension who presented to ER with chest discomfort for approximately 2 weeks. The pain was substernal, non radiating, and was associated primarily with SOB. He had mild le edema and had recently had his diuretics increased. He had not had presyncope or syncope.  On arrival to the ER he was found to be in rapid atrial fibrillation with rates in the 140s, labs were unremarkable aside from mild AKI with sCr 1.35. CTA was ordered to evaluate his aorta, which showed stable thoracic aneurysm. He denied missing any doses of his Xarelto so Dr. Kathrynn Humble (Emergency Physician) sedated the patient and performed a  successful DCCV in the ER.  He did have some associated hypotension after the cardioversion -- systolic 09G -- which recovered after a liter fluid bolus.   We were called and asked to admit.  Hospital Course     Consultants: None  The patient was seen in the ER at 12:00am and admitted by the Cardiology Fellow by 12:30am. Due to lack of hospital beds the patient stayed in the ER throughout his admission.  Dr. Marlou Porch went to the ER early this morning (same day) to evaluate patient and feels he is ready for discharge home. He was having no chest pains, shortness of breath or dizziness. His blood pressure had improved  to systolic 283.  His EKG now shows sinus rhythm.   The patient has multiple risk factors for pAF including obesity obstructive sleep apnea. He was successfully cardioverted in the ER and is on full anticoagulation with Xarelto. He experienced some mild hypotension post cardioversion and has since recovered but is currently asymptomatic. We will hold his home medications of Spironolactone, Lasix and Losartan at discharge. At his outpatient follow-up appointment please re-evaluate initiating his diuretics and antihypertensives once again. His Prilosec was changed to Protonix and I refilled his nitroglycerin. His thoracic aneurysm on CTA noted to be stable (5 cm), Dr. Servando Snare is monitoring.  The patient has had an uncomplicated hospital course and is recovering well.  Discharge medications are listed below.  _____________  Discharge Vitals Blood pressure 117/77, pulse 65, temperature 97.7 F (36.5 C), temperature source Oral, resp. rate 17, height 5\' 7"  (1.702 m), weight 265 lb (120.2 kg), SpO2 100 %.  Filed Weights   09/14/16 2139  Weight: 265 lb (120.2 kg)    Labs & Radiologic Studies     CBC  Recent Labs  09/14/16 2139  WBC 9.1  HGB 12.5*  HCT 38.6*  MCV 85.6  PLT 662   Basic Metabolic Panel  Recent Labs  09/14/16 2139 09/14/16 2310 09/15/16 0513  NA 135  --  138  K 3.7  --  4.0  CL 102  --  105  CO2 22  --  24  GLUCOSE  233*  --  140*  BUN 17  --  18  CREATININE 1.35*  --  1.13  CALCIUM 8.8*  --  8.6*  MG  --  2.0  --    Liver Function Tests No results for input(s): AST, ALT, ALKPHOS, BILITOT, PROT, ALBUMIN in the last 72 hours. No results for input(s): LIPASE, AMYLASE in the last 72 hours. Cardiac Enzymes  Recent Labs  09/14/16 2139 09/15/16 0513  TROPONINI <0.03 <0.03   BNP Invalid input(s): POCBNP D-Dimer No results for input(s): DDIMER in the last 72 hours. Hemoglobin A1C No results for input(s): HGBA1C in the last 72 hours. Fasting Lipid  Panel No results for input(s): CHOL, HDL, LDLCALC, TRIG, CHOLHDL, LDLDIRECT in the last 72 hours. Thyroid Function Tests No results for input(s): TSH, T4TOTAL, T3FREE, THYROIDAB in the last 72 hours.  Invalid input(s): FREET3  Ct Lumbar Spine W Contrast  Result Date: 08/22/2016 CLINICAL DATA:  Low back pain.  BILATERAL leg pain. EXAM: LUMBAR MYELOGRAM FLUOROSCOPY TIME:  60 seconds corresponding to a Dose Area Product of 484.38 Gy*m2 PROCEDURE: After thorough discussion of risks and benefits of the procedure including bleeding, infection, injury to nerves, blood vessels, adjacent structures as well as headache and CSF leak, written and oral informed consent was obtained. Consent was obtained by Dr. Rolla Flatten. Time out form was completed. Patient was positioned prone on the fluoroscopy table. Local anesthesia was provided with 1% lidocaine without epinephrine after prepped and draped in the usual sterile fashion. Puncture was performed at L3-L4 using a 3 1/2 inch 20 gauge spinal needle via midline approach. Using a single pass through the dura, the needle was placed within the thecal sac, with return of clear CSF. 15 mL of Isovue-M 200 was injected into the thecal sac, with normal opacification of the nerve roots and cauda equina consistent with free flow within the subarachnoid space. I personally performed the lumbar puncture and administered the intrathecal contrast. I also personally supervised acquisition of the myelogram images. TECHNIQUE: Contiguous axial images were obtained through the Lumbar spine after the intrathecal infusion of infusion. Coronal and sagittal reconstructions were obtained of the axial image sets. COMPARISON:  MRI lumbar spine 03/15/2016. Lumbar myelogram 03/27/2013. FINDINGS: LUMBAR MYELOGRAM FINDINGS: Good opacification lumbar subarachnoid space. Previous L4-5 PLIF. No impingement at the fusion site. Moderate stenosis at L2-3 with BILATERAL L3 nerve root effacement. Mild  stenosis at L1-2, asymmetric loss of interspace height, and retrolisthesis, with BILATERAL L2 nerve root impingement. No significant impingement at L3-4, L4-5, or L5-S1. With the patient recumbent for myelography, there is 3 mm retrolisthesis L3-4, 2 mm retrolisthesis L2-3, and 5 mm retrolisthesis L1-2. With patient upright, these measurements are unchanged throughout neutral, flexion, and extension. CT LUMBAR MYELOGRAM FINDINGS: Segmentation: Normal. Alignment:  Normal. Vertebrae: No worrisome osseous lesion. Conus medullaris: Normal in size.  Low termination, L2-3. Paraspinal tissues: No evidence for hydronephrosis or paravertebral mass. Aortic atherosclerosis. Gastric banding device. Disc levels: L1-L2: Previous laminectomy. Adequate posterior decompression. 5 mm retrolisthesis. Moderate osseous spurring. Uncovering of the disc. Posterior displacement of the conus, with mild stenosis. BILATERAL subarticular zone and foraminal zone narrowing affect the L1 and L2 nerve roots. L2-L3: Low conus terminating at L2-3. Partial regrowth of posterior elements. 2 mm retrolisthesis. Facet arthropathy. No significant disc pathology. Moderate to severe stenosis. BILATERAL L2 and L3 nerve root impingement. L3-L4: Partial regrowth of posterior elements. Facet arthropathy. 4 mm retrolisthesis. No soft disc protrusion. Mild stenosis. Uncovering of the disc. LEFT greater than RIGHT  L4 nerve root impingement. LEFT greater than RIGHT L3 nerve root impingement. L4-L5: Posterior element hypertrophy. Solid posterior arthrodesis. Adequate posterior decompression. Incomplete interbody arthrodesis. No definite subarticular zone narrowing, but RIGHT greater than LEFT foraminal narrowing affecting the L4 nerve roots. L5-S1: Unremarkable disc space. Facet arthropathy. Adequate posterior decompression. No definite subarticular zone or foraminal zone narrowing. IMPRESSION: LUMBAR MYELOGRAM IMPRESSION: Status post L4-5 PLIF. No residual  impingement. There is no movement at this level with flexion extension. Adjacent segment disease at L1-2 and L2-3, with moderate stenosis most prominent at the L2-3 level. Retrolisthesis at L1-2 5 mm, L2-3 of 2 mm, and L3-4 3 mm. No dynamic instability, but the combined effect of retrolisthesis at these three levels is likely to contribute to sagittal imbalance. CT LUMBAR MYELOGRAM IMPRESSION: Moderate to severe stenosis at L2-L3. Partial regrowth of posterior elements with 2 mm retrolisthesis. BILATERAL L2 and L3 nerve root impingement. Similar less severe changes at L1-2, with 5 mm retrolisthesis, uncovering of the disc, osseous spurring, and posterior displacement of the conus. BILATERAL L1 and L2 nerve root impingement. Solid posterior arthrodesis at L4-5. Incomplete adequate interbody arthrodesis. RIGHT greater than LEFT foraminal narrowing affecting the L4 nerve roots. Mild adjacent segment disease at L3-4, with LEFT greater than RIGHT subarticular zone and foraminal zone narrowing. No significant stenosis, but 4 mm of retrolisthesis is observed. Electronically Signed   By: Staci Righter M.D.   On: 08/22/2016 11:36   Dg Chest Portable 1 View  Result Date: 09/14/2016 CLINICAL DATA:  Central chest pain for 2 days.  Tachycardia. EXAM: PORTABLE CHEST 1 VIEW COMPARISON:  Radiographs 03/30/2016, CT 10/14/2015 FINDINGS: Stable cardiomegaly and tortuous thoracic aorta. Mediastinal contours are unchanged. No pulmonary edema. Streaky bibasilar atelectasis. No confluent airspace disease. No pleural fluid or pneumothorax. Postsurgical change about the right shoulder. IMPRESSION: Stable cardiomegaly.  Streaky bibasilar atelectasis. Electronically Signed   By: Jeb Levering M.D.   On: 09/14/2016 22:51   Dg Myelography Lumbar Inj Lumbosacral  Result Date: 08/22/2016 CLINICAL DATA:  Low back pain.  BILATERAL leg pain. EXAM: LUMBAR MYELOGRAM FLUOROSCOPY TIME:  60 seconds corresponding to a Dose Area Product of  484.38 Gy*m2 PROCEDURE: After thorough discussion of risks and benefits of the procedure including bleeding, infection, injury to nerves, blood vessels, adjacent structures as well as headache and CSF leak, written and oral informed consent was obtained. Consent was obtained by Dr. Rolla Flatten. Time out form was completed. Patient was positioned prone on the fluoroscopy table. Local anesthesia was provided with 1% lidocaine without epinephrine after prepped and draped in the usual sterile fashion. Puncture was performed at L3-L4 using a 3 1/2 inch 20 gauge spinal needle via midline approach. Using a single pass through the dura, the needle was placed within the thecal sac, with return of clear CSF. 15 mL of Isovue-M 200 was injected into the thecal sac, with normal opacification of the nerve roots and cauda equina consistent with free flow within the subarachnoid space. I personally performed the lumbar puncture and administered the intrathecal contrast. I also personally supervised acquisition of the myelogram images. TECHNIQUE: Contiguous axial images were obtained through the Lumbar spine after the intrathecal infusion of infusion. Coronal and sagittal reconstructions were obtained of the axial image sets. COMPARISON:  MRI lumbar spine 03/15/2016. Lumbar myelogram 03/27/2013. FINDINGS: LUMBAR MYELOGRAM FINDINGS: Good opacification lumbar subarachnoid space. Previous L4-5 PLIF. No impingement at the fusion site. Moderate stenosis at L2-3 with BILATERAL L3 nerve root effacement. Mild stenosis at L1-2, asymmetric loss  of interspace height, and retrolisthesis, with BILATERAL L2 nerve root impingement. No significant impingement at L3-4, L4-5, or L5-S1. With the patient recumbent for myelography, there is 3 mm retrolisthesis L3-4, 2 mm retrolisthesis L2-3, and 5 mm retrolisthesis L1-2. With patient upright, these measurements are unchanged throughout neutral, flexion, and extension. CT LUMBAR MYELOGRAM FINDINGS:  Segmentation: Normal. Alignment:  Normal. Vertebrae: No worrisome osseous lesion. Conus medullaris: Normal in size.  Low termination, L2-3. Paraspinal tissues: No evidence for hydronephrosis or paravertebral mass. Aortic atherosclerosis. Gastric banding device. Disc levels: L1-L2: Previous laminectomy. Adequate posterior decompression. 5 mm retrolisthesis. Moderate osseous spurring. Uncovering of the disc. Posterior displacement of the conus, with mild stenosis. BILATERAL subarticular zone and foraminal zone narrowing affect the L1 and L2 nerve roots. L2-L3: Low conus terminating at L2-3. Partial regrowth of posterior elements. 2 mm retrolisthesis. Facet arthropathy. No significant disc pathology. Moderate to severe stenosis. BILATERAL L2 and L3 nerve root impingement. L3-L4: Partial regrowth of posterior elements. Facet arthropathy. 4 mm retrolisthesis. No soft disc protrusion. Mild stenosis. Uncovering of the disc. LEFT greater than RIGHT L4 nerve root impingement. LEFT greater than RIGHT L3 nerve root impingement. L4-L5: Posterior element hypertrophy. Solid posterior arthrodesis. Adequate posterior decompression. Incomplete interbody arthrodesis. No definite subarticular zone narrowing, but RIGHT greater than LEFT foraminal narrowing affecting the L4 nerve roots. L5-S1: Unremarkable disc space. Facet arthropathy. Adequate posterior decompression. No definite subarticular zone or foraminal zone narrowing. IMPRESSION: LUMBAR MYELOGRAM IMPRESSION: Status post L4-5 PLIF. No residual impingement. There is no movement at this level with flexion extension. Adjacent segment disease at L1-2 and L2-3, with moderate stenosis most prominent at the L2-3 level. Retrolisthesis at L1-2 5 mm, L2-3 of 2 mm, and L3-4 3 mm. No dynamic instability, but the combined effect of retrolisthesis at these three levels is likely to contribute to sagittal imbalance. CT LUMBAR MYELOGRAM IMPRESSION: Moderate to severe stenosis at L2-L3. Partial  regrowth of posterior elements with 2 mm retrolisthesis. BILATERAL L2 and L3 nerve root impingement. Similar less severe changes at L1-2, with 5 mm retrolisthesis, uncovering of the disc, osseous spurring, and posterior displacement of the conus. BILATERAL L1 and L2 nerve root impingement. Solid posterior arthrodesis at L4-5. Incomplete adequate interbody arthrodesis. RIGHT greater than LEFT foraminal narrowing affecting the L4 nerve roots. Mild adjacent segment disease at L3-4, with LEFT greater than RIGHT subarticular zone and foraminal zone narrowing. No significant stenosis, but 4 mm of retrolisthesis is observed. Electronically Signed   By: Staci Righter M.D.   On: 08/22/2016 11:36   Ct Angio Chest Aorta W And/or Wo Contrast  Result Date: 09/15/2016 CLINICAL DATA:  Chest pain x2 days, follow-up known thoracic aortic aneurysm EXAM: CT ANGIOGRAPHY CHEST WITH CONTRAST TECHNIQUE: Multidetector CT imaging of the chest was performed using the standard protocol during bolus administration of intravenous contrast. Multiplanar CT image reconstructions and MIPs were obtained to evaluate the vascular anatomy. CONTRAST:  90 mL Isovue 370 IV COMPARISON:  10/14/2015 FINDINGS: Cardiovascular: Preferential opacification of the thoracic aorta. No evidence of aortic dissection. Stable ascending thoracic aortic aneurysm, measuring 5.0 cm (series 5/ image 53). Mild atherosclerotic calcifications. Although not tailored for evaluation of the pulmonary arteries, there is no evidence of pulmonary embolism to the lobar level. Heart is top-normal in size.  No pericardial effusion. Mild coronary atherosclerosis of the LAD and left circumflex. Mediastinum/Nodes: No suspicious mediastinal lymphadenopathy. Visualized thyroid is unremarkable. Lungs/Pleura: No suspicious pulmonary nodules. No focal consolidation. Mild dependent atelectasis in the bilateral lower lobes. No pleural effusion  or pneumothorax. Upper Abdomen: Visualized upper  abdomen is notable for mild hepatic steatosis and cholecystectomy clips. Musculoskeletal: Degenerative changes of the visualized thoracolumbar spine. Review of the MIP images confirms the above findings. IMPRESSION: Stable 5.0 cm ascending thoracic aortic aneurysm. No evidence of aortic dissection. Recommend semi-annual imaging followup by CTA or MRA and referral to cardiothoracic surgery if not already obtained. This recommendation follows 2010 ACCF/AHA/AATS/ACR/ASA/SCA/SCAI/SIR/STS/SVM Guidelines for the Diagnosis and Management of Patients With Thoracic Aortic Disease. Circulation. 2010; 121: D408-X448 No evidence of acute cardiopulmonary disease. Aortic Atherosclerosis (ICD10-I70.0). Electronically Signed   By: Julian Hy M.D.   On: 09/15/2016 01:33     Diagnostic Studies/Procedures    Echocardiogram 04/27/2014 Study Conclusions  - Left ventricle: The cavity size was normal. Systolic function was normal. The estimated ejection fraction was in the range of 55% to 60%. Wall motion was normal; there were no regional wall motion abnormalities. There was an increased relative contribution of atrial contraction to ventricular filling. Doppler parameters are consistent with abnormal left ventricular relaxation (grade 1 diastolic dysfunction). - Aortic valve: Poorly visualized. Valve area (VTI): 1.53 cm^2. Valve area (Vmax): 1.6 cm^2. Valve area (Vmean): 1.88 cm^2. - Aorta: Aortic root dimension: 47 mm (ED). - Aortic root: The aortic root was mild to moderately dilated. - Tricuspid valve: There was trivial regurgitation.   _____________    Disposition   Pt is being discharged home today in good condition.  Follow-up Plans & Appointments    Follow-up Information    Erma [facility], [place]. Go on 09/22/2016.   Specialties:  Physician Assistant, Cardiology Why:  Your appointment is at 9:30am, please arrive 10-15 minutes early. Contact information: [address] [place] [address] [phone]          Discharge Instructions    Amb referral to [facility]    Complete by:  As directed    Diet - low sodium heart healthy    Complete by:  As directed    Increase activity slowly    Complete by:  As directed    May shower / Bathe    Complete by:  As directed       Discharge Medications   Allergies as of 09/15/2016      Reactions   Adhesive [tape] Itching, Rash   Latex Itching, Rash   Bandaids   Morphine Itching      Medication List    STOP taking these medications   furosemide 40 MG tablet Commonly known as:  LASIX   HYDROcodone-homatropine 5-1.5 MG/5ML syrup Commonly known as:  HYDROMET   losartan 25 MG tablet Commonly known as:  COZAAR   omeprazole 20 MG capsule Commonly known as:  PRILOSEC Replaced by:  pantoprazole 40 MG tablet   spironolactone 25 MG tablet Commonly known as:  ALDACTONE     TAKE these medications   acetaminophen 500 MG tablet Commonly known as:  TYLENOL Take 1,000 mg by mouth every 8 (eight) hours as needed for mild pain.   albuterol 108 (90 Base) MCG/ACT inhaler Commonly known as:  PROVENTIL HFA;VENTOLIN HFA Inhale 2 puffs into the lungs every 4 (four) hours as needed for wheezing or shortness of breath.   atenolol 25 MG tablet Commonly known as:  TENORMIN Take 25 mg by mouth every morning.   atorvastatin 10 MG tablet Commonly known as:  LIPITOR Take 10 mg by mouth daily at 6 PM.   B-6 PO Take 2 tablets by mouth as needed.  budesonide-formoterol 160-4.5 MCG/ACT inhaler Commonly known as:  SYMBICORT Inhale 2 puffs into the lungs 2 (two) times daily.   celecoxib 200 MG capsule Commonly known as:  CELEBREX Take 200 mg by mouth 2 (two) times daily.   dicyclomine 20 MG tablet Commonly known as:  BENTYL Take 20 mg by mouth 2 (two) times daily as needed for spasms.   eucerin cream Apply 1 application topically as needed for dry skin.   metFORMIN 500 MG 24 hr  tablet Commonly known as:  GLUCOPHAGE-XR Take 500 mg by mouth daily before supper.   methocarbamol 500 MG tablet Commonly known as:  ROBAXIN Take 1 tablet (500 mg total) by mouth 2 (two) times daily as needed for muscle spasms.   montelukast 10 MG tablet Commonly known as:  SINGULAIR Take 10 mg by mouth at bedtime.   nitroGLYCERIN 0.4 MG SL tablet Commonly known as:  NITROSTAT Place 1 tablet (0.4 mg total) under the tongue every 5 (five) minutes as needed for chest pain. What changed:  medication strength   pantoprazole 40 MG tablet Commonly known as:  PROTONIX Take 1 tablet (40 mg total) by mouth daily. Replaces:  omeprazole 20 MG capsule   potassium chloride SA 20 MEQ tablet Commonly known as:  KLOR-CON M20 Take 2 tablets (40 mEq total) by mouth 2 (two) times daily.   rivaroxaban 20 MG Tabs tablet Commonly known as:  XARELTO Take 20 mg by mouth daily with supper.   sildenafil 100 MG tablet Commonly known as:  VIAGRA Take 100 mg by mouth as needed for erectile dysfunction.   tamsulosin 0.4 MG Caps capsule Commonly known as:  FLOMAX Take 0.4 mg by mouth every evening.   topiramate 25 MG capsule Commonly known as:  TOPAMAX Take 50 mg by mouth 2 (two) times daily.   traMADol 50 MG tablet Commonly known as:  ULTRAM Take 50 mg by mouth every 12 (twelve) hours as needed for moderate pain.   Vitamin D-3 5000 units Tabs Take 5,000 Units by mouth daily.            Discharge Care Instructions        Start     Ordered   09/16/16 0000  pantoprazole (PROTONIX) 40 MG tablet  Daily     09/15/16 0942   09/15/16 0000  nitroGLYCERIN (NITROSTAT) 0.4 MG SL tablet  Every 5 min PRN     09/15/16 0942   09/15/16 0000  Increase activity slowly     09/15/16 0942   09/15/16 0000  May shower / Bathe     09/15/16 0942   09/15/16 0000  Diet - low sodium heart healthy     09/15/16 0942   09/14/16 0000  Amb referral to [facility]     09/14/16 2245        Outstanding  Labs/Studies    Duration of Discharge Encounter   Greater than 30 minutes including physician time.  Signed, Linus Mako PA-C 09/15/2016, 10:10 AM  Subjective   No chest pain, no shortness of breath, no dizziness  Inpatient Medications    Scheduled Meds: . atorvastatin  10 mg Oral q1800  . etomidate  16 mg Intravenous Once  . montelukast  10 mg Oral QHS  . pantoprazole  40 mg Oral Daily  . rivaroxaban  20 mg Oral Q supper  . topiramate  50 mg Oral BID   Continuous Infusions: PRN Meds: albuterol, etomidate, methocarbamol, ondansetron (ZOFRAN) IV, traMADol   Vital Signs  Vitals:   09/15/16 0310 09/15/16 0330 09/15/16 0400 09/15/16 0600  BP: 99/67 95/62 (!) 95/47 111/69  Pulse: 67 64 64 64  Resp: 17 15 17 18   Temp:      TempSrc:      SpO2: 96% 98% 94% 97%  Weight:      Height:        Intake/Output Summary (Last 24 hours) at 09/15/16 0917 Last data filed at 09/14/16 2320  Gross per 24 hour  Intake              500 ml  Output                0 ml  Net              500 ml      Filed Weights   09/14/16 2139  Weight: 265 lb (120.2 kg)    Telemetry    Sinus rhythm/sinus bradycardia currently. Previous atrial fibrillation - Personally Reviewed  ECG    Atrial fibrillation - Personally Reviewed  Physical Exam   GEN:No acute distress.   Neck:No JVD Cardiac:RRR, no murmurs, rubs, or gallops.  Respiratory:Clear to auscultation bilaterally. IO:EVOJ, nontender, non-distended  MS:No edema; No deformity. Neuro:Nonfocal  Psych: Normal affect   Labs    Chemistry Last Labs    Recent Labs Lab 09/14/16 2139 09/15/16 0513  NA 135 138  K 3.7 4.0  CL 102 105  CO2 22 24  GLUCOSE 233* 140*  BUN 17 18  CREATININE 1.35* 1.13  CALCIUM 8.8* 8.6*  GFRNONAA 50* >60  GFRAA 58* >60  ANIONGAP 11 9       Hematology Last Labs    Recent Labs Lab 09/14/16 2139  WBC 9.1  RBC 4.51  HGB 12.5*  HCT 38.6*   MCV 85.6  MCH 27.7  MCHC 32.4  RDW 14.5  PLT 184      Cardiac Enzymes Last Labs    Recent Labs Lab 09/14/16 2139 09/15/16 0513  TROPONINI <0.03 <0.03      Last Labs   No results for input(s): TROPIPOC in the last 168 hours.     BNP Last Labs    Recent Labs Lab 09/14/16 2310  BNP 476.3*       DDimer  Last Labs   No results for input(s): DDIMER in the last 168 hours.     Radiology     Imaging Results (Last 48 hours)  Dg Chest Portable 1 View  Result Date: 09/14/2016 CLINICAL DATA:  Central chest pain for 2 days.  Tachycardia. EXAM: PORTABLE CHEST 1 VIEW COMPARISON:  Radiographs 03/30/2016, CT 10/14/2015 FINDINGS: Stable cardiomegaly and tortuous thoracic aorta. Mediastinal contours are unchanged. No pulmonary edema. Streaky bibasilar atelectasis. No confluent airspace disease. No pleural fluid or pneumothorax. Postsurgical change about the right shoulder. IMPRESSION: Stable cardiomegaly.  Streaky bibasilar atelectasis. Electronically Signed   By: Jeb Levering M.D.   On: 09/14/2016 22:51   Ct Angio Chest Aorta W And/or Wo Contrast  Result Date: 09/15/2016 CLINICAL DATA:  Chest pain x2 days, follow-up known thoracic aortic aneurysm EXAM: CT ANGIOGRAPHY CHEST WITH CONTRAST TECHNIQUE: Multidetector CT imaging of the chest was performed using the standard protocol during bolus administration of intravenous contrast. Multiplanar CT image reconstructions and MIPs were obtained to evaluate the vascular anatomy. CONTRAST:  90 mL Isovue 370 IV COMPARISON:  10/14/2015 FINDINGS: Cardiovascular: Preferential opacification of the thoracic aorta. No evidence of aortic dissection. Stable ascending thoracic aortic aneurysm, measuring 5.0 cm (series  5/ image 53). Mild atherosclerotic calcifications. Although not tailored for evaluation of the pulmonary arteries, there is no evidence of pulmonary embolism to the lobar level. Heart is top-normal in size.  No pericardial  effusion. Mild coronary atherosclerosis of the LAD and left circumflex. Mediastinum/Nodes: No suspicious mediastinal lymphadenopathy. Visualized thyroid is unremarkable. Lungs/Pleura: No suspicious pulmonary nodules. No focal consolidation. Mild dependent atelectasis in the bilateral lower lobes. No pleural effusion or pneumothorax. Upper Abdomen: Visualized upper abdomen is notable for mild hepatic steatosis and cholecystectomy clips. Musculoskeletal: Degenerative changes of the visualized thoracolumbar spine. Review of the MIP images confirms the above findings. IMPRESSION: Stable 5.0 cm ascending thoracic aortic aneurysm. No evidence of aortic dissection. Recommend semi-annual imaging followup by CTA or MRA and referral to cardiothoracic surgery if not already obtained. This recommendation follows 2010 ACCF/AHA/AATS/ACR/ASA/SCA/SCAI/SIR/STS/SVM Guidelines for the Diagnosis and Management of Patients With Thoracic Aortic Disease. Circulation. 2010; 121: L381-O175 No evidence of acute cardiopulmonary disease. Aortic Atherosclerosis (ICD10-I70.0). Electronically Signed   By: Julian Hy M.D.   On: 09/15/2016 01:33     Cardiac Studies   2016 echocardiogram EF 55%. CT scan of chest-stable 5 cm aorta  Patient Profile     76 year old male with paroxysmal atrial fibrillation status post successful cardioversion in the emergency department on chronic anticoagulation with history of thoracic aortic aneurysm, pulmonary emboli, morbid obesity, lap band, hypertension, Xarelto.  Assessment & Plan    Paroxysmal atrial fibrillation  - Multiple risk factors including obesity obstructive sleep apnea  - Successful cardioversion in the emergency department on full anticoagulation with Xarelto.  - Mild hypotension post cardioversion, currently asymptomatic.  Hypotension  - Post cardioversion. Holding spironolactone, Lasix, losartan 25 mg as he is taking at home.  - Reevaluation as outpatient to  initiate diuretics and antihypertensive once again.  - He is comfortable, not dizzy, ambulating well. Received IV fluids. Okay for discharge.  Thoracic aneurysm  - Dr. Servando Snare monitoring  - CT scan stable 5 cm.  Okay for discharge  Signed, Candee Furbish, MD  09/15/2016, 9:17 AM

## 2016-09-15 NOTE — ED Notes (Signed)
Patient ambulated to the restroom.  No distress noted at this time. Patient resting in chair upon return

## 2016-09-18 DIAGNOSIS — Z6841 Body Mass Index (BMI) 40.0 and over, adult: Secondary | ICD-10-CM | POA: Diagnosis not present

## 2016-09-18 DIAGNOSIS — G629 Polyneuropathy, unspecified: Secondary | ICD-10-CM | POA: Insufficient documentation

## 2016-09-18 DIAGNOSIS — M48062 Spinal stenosis, lumbar region with neurogenic claudication: Secondary | ICD-10-CM | POA: Diagnosis not present

## 2016-09-18 DIAGNOSIS — M5416 Radiculopathy, lumbar region: Secondary | ICD-10-CM | POA: Diagnosis not present

## 2016-09-21 NOTE — Progress Notes (Signed)
Cardiology Office Note    Date:  09/22/2016   ID:  Carlos Dawson, DOB [birth year], MRN [id number]  PCP:  Josetta Huddle, MD  Cardiologist: Dr. Martinique   Chief Complaint  Patient presents with  . Hospitalization Follow-up    History of Present Illness:    Carlos Dawson is a 76 y.o. male with past medical history of CAD (s/p BMS to 2nd OM in 2012, low-risk NST in 12/2012), PAF (on Xarelto), chronic diastolic CHF, prior PE, HTN, HLD, Type 2 DM, and known thoracic aortic aneurysm who presents to the office today for hospital follow-up.   He was recently admitted from 8/23 - 09/15/2016 for evaluation of chest discomfort and palpitations. His initial EKG showed that he was in atrial fibrillation with RVR. He reported good compliance with his Xarelto and denied missing any doses, therefore a successful DCCV was performed in the ED at [place].  He became hypotensive with SBP in the 80's following the procedure, therefore he was admitted overnight for further observation. His Losartan, Spironolactone, and Lasix were held at the time of discharge with plans to resume at follow-up if BP allowed. A CTA was also obtained during admission to rule out a recurrent PE and was negative for a PE but did show a stable 5.0 cm ascending thoracic aortic aneurysm.    In talking with the patient today, he reports doing well since his recent hospitalization. He denies any recurrent chest pain, palpitations, or dyspnea. No recent orthopnea or PND. Has chronic lower extremity edema. He reports good compliance with CPAP.  Denies missing any doses of Xarelto. No recent melena or hematochezia. He does not check blood pressure regularly but this is well-controlled at 108/66 during today's visit. Has has already restarted his Losartan, Spironolactone, and Lasix.    Past Medical History:  Diagnosis Date  . Anticoagulant long-term use   . Aortic root enlargement ([place])   . Ascending aortic aneurysm [facility])    recent scan  in October 2012 showing no change; followed by Dr. Servando Snare  . ASCVD (arteriosclerotic cardiovascular disease)    Prior BMS to the 2nd OM in September 2012; with repeat cath in October showing patency  . CAD (coronary artery disease)    a. s/p BMS to 2nd OM in Sept 2012; b. LexiScan Myoview (12/2012):  Inf infarct; bowel and motion artifact make study difficult to interpret; no ischemia; not gated; Low Risk  . CHF (congestive heart failure) ([place])    no recent issues 10/13/14  . Colonic polyp   . Contact lens/glasses fitting   . Diabetes mellitus without complication (HCC)    metphormin, average 154 dx 2017  . Diastolic dysfunction   . Generalized headaches    neck stenosis  . GERD (gastroesophageal reflux disease)   . Hearing loss   . Hearing loss    more so on left  . Hemorrhoids   . Hypertension   . IBS (irritable bowel syndrome)   . LVH (left ventricular hypertrophy)   . Mild intermittent asthma   . OA (osteoarthritis)   . Obesities, morbid ([place])   . OSA (obstructive sleep apnea)    PSG 03/30/97 AHI 21, BPAP 13/9  . PAF (paroxysmal atrial fibrillation) ([place])    a. on Xarelto b. s/p DCCV in 08/2016  . Pneumonia   . Prostate CA [facility])    Oncologist  DR. Daralene [facility] 09/24/14, undetermined tx   prostate  . Pulmonary embolism ([place])    2008  .  Sleep apnea   . SOB (shortness of breath)    on excertion  . Thoracic aortic aneurysm (HCC)    Aortic Size Index=     5.0    /Body surface area is 2.43 meters squared. = 2.05  < 2.75 cm/m2      4% risk per year 2.75 to 4.25          8% risk per year > 4.25 cm/m2    20% risk per year   Stable aneurysmal dilation of the ascending aorta with maximum AP diameter of 4.8 cm. Stable area of narrowing of the proximal most portion of the descending aorta measuring 2 cm., previously identified as an area of coarctation. No evidence of aortic dissection.  Coronary artery disease.  Normal appearance of the lungs.   Electronically Signed   By: Fidela Salisbury M.D.   On: 10/01/2014 08:50      Past Surgical History:  Procedure Laterality Date  . ACHILLES TENDON REPAIR    . APPENDECTOMY    . CARDIAC CATHETERIZATION  2006  . CARDIAC CATHETERIZATION  October 2012   Stent patent  . CARPAL TUNNEL RELEASE     LEFT  . CERVICAL SPINE SURGERY  06/02/2010   lower back and neck  . CHOLECYSTECTOMY    . COLONOSCOPY WITH PROPOFOL N/A 12/29/2014   Procedure: COLONOSCOPY WITH PROPOFOL;  Surgeon: Garlan Fair, MD;  Location: [facility] END[facility]COPY;  Service: Endoscopy;  Laterality: N/A;  . CORONARY STENT PLACEMENT  Sept 2012   2nd OM with BMS  . EYE SURGERY     bilateral cataract  . HEMORROIDECTOMY    . LAMINECTOMY  05/30/2012   L 4 L5  . LAPAROSCOPIC GASTRIC BANDING    . LEFT AND RIGHT HEART CATHETERIZATION WITH CORONARY ANGIOGRAM N/A 05/07/2014   Procedure: LEFT AND RIGHT HEART CATHETERIZATION WITH CORONARY ANGIOGRAM;  Surgeon: [name] Martinique, MD;  Location: [facility] CATH LAB;  Service: Cardiovascular;  Laterality: N/A;  . LUMBAR LAMINECTOMY/DECOMPRESSION MICRODISCECTOMY N/A 05/04/2016   Procedure: Laminectomy and Foraminotomy - Thoracic twelve-Lumbar one -Posterior Fusion Lumbar one-two;  Surgeon: Eustace Moore, MD;  Location: [facility];  Service: Neurosurgery;  Laterality: N/A;  . ROTATOR CUFF REPAIR     both  . TONSILLECTOMY    . TRIGGER FINGER RELEASE     LEFT  . UVULOPALATOPHARYNGOPLASTY    . VASECTOMY      Current Medications: Outpatient Medications Prior to Visit  Medication Sig Dispense Refill  . acetaminophen (TYLENOL) 500 MG tablet Take 1,000 mg by mouth every 8 (eight) hours as needed for mild pain.     Marland Kitchen albuterol (PROVENTIL HFA;VENTOLIN HFA) 108 (90 Base) MCG/ACT inhaler Inhale 2 puffs into the lungs every 4 (four) hours as needed for wheezing or shortness of breath.    Marland Kitchen atenolol (TENORMIN) 25 MG tablet Take 25 mg by mouth every morning.     Marland Kitchen atorvastatin (LIPITOR) 10 MG tablet Take 10 mg by mouth daily at 6 PM.     . celecoxib (CELEBREX)  200 MG capsule Take 200 mg by mouth 2 (two) times daily.    . Cholecalciferol (VITAMIN D-3) 5000 units TABS Take 5,000 Units by mouth daily.    Marland Kitchen dicyclomine (BENTYL) 20 MG tablet Take 20 mg by mouth 2 (two) times daily as needed for spasms.    . furosemide (LASIX) 40 MG tablet Take 2 tablets (80 mg total) by mouth 2 (two) times daily. 30 tablet 0  . metFORMIN (GLUCOPHAGE-XR) 500 MG  24 hr tablet Take 500 mg by mouth daily before supper.    . methocarbamol (ROBAXIN) 500 MG tablet Take 1 tablet (500 mg total) by mouth 2 (two) times daily as needed for muscle spasms. 20 tablet 0  . montelukast (SINGULAIR) 10 MG tablet Take 10 mg by mouth at bedtime.    . nitroGLYCERIN (NITROSTAT) 0.4 MG SL tablet Place 1 tablet (0.4 mg total) under the tongue every 5 (five) minutes as needed for chest pain. 25 tablet 5  . omeprazole (PRILOSEC) 20 MG capsule Take 20 mg by mouth daily before breakfast.    . pantoprazole (PROTONIX) 40 MG tablet Take 1 tablet (40 mg total) by mouth daily. 30 tablet 11  . potassium chloride SA (KLOR-CON M20) 20 MEQ tablet Take 2 tablets (40 mEq total) by mouth 2 (two) times daily. 360 tablet 2  . Pyridoxine HCl (B-6 PO) Take 2 tablets by mouth as needed.     . rivaroxaban (XARELTO) 20 MG TABS tablet Take 20 mg by mouth daily with supper.     . sildenafil (VIAGRA) 100 MG tablet Take 100 mg by mouth as needed for erectile dysfunction.     . Skin Protectants, Misc. (EUCERIN) cream Apply 1 application topically as needed for dry skin.    Marland Kitchen spironolactone (ALDACTONE) 25 MG tablet Take 25 mg by mouth at bedtime.     . tamsulosin (FLOMAX) 0.4 MG CAPS Take 0.4 mg by mouth every evening.     . topiramate (TOPAMAX) 25 MG capsule Take 50 mg by mouth 2 (two) times daily.     . traMADol (ULTRAM) 50 MG tablet Take 50 mg by mouth every 12 (twelve) hours as needed for moderate pain.     Marland Kitchen losartan (COZAAR) 25 MG tablet Take 1 tablet (25 mg total) by mouth daily. 90 tablet 3  . budesonide-formoterol  (SYMBICORT) 160-4.5 MCG/ACT inhaler Inhale 2 puffs into the lungs 2 (two) times daily. (Patient not taking: Reported on 09/14/2016) 1 Inhaler 6  . HYDROcodone-homatropine (HYDROMET) 5-1.5 MG/5ML syrup Take 5 mLs by mouth every 6 (six) hours as needed. (Patient not taking: Reported on 09/14/2016) 240 mL 0   No facility-administered medications prior to visit.      Allergies:   Adhesive [tape]; Latex; and Morphine   Social History   Social History  . Marital status: Married    Spouse name: N/A  . Number of children: 3  . Years of education: N/A   Occupational History  . Retired from [employer]ired   Social History Main Topics  . Smoking status: Former Smoker    Packs/day: 1.50    Years: 30.00    Quit date: 01/24/1992  . Smokeless tobacco: Never Used     Comment: Stopped in 1983  . Alcohol use No  . Drug use: No  . Sexual activity: Yes   Other Topics Concern  . None   Social History Narrative  . None     Family History:  The patient's family history includes Diabetes in his mother; Emphysema (age of onset: 87) in his father; Heart attack in his sister; Heart disease in his mother; Other in his mother.   Review of Systems:   Please see the history of present illness.     General:  No chills, fever, night sweats or weight changes.  Cardiovascular:  No chest pain, dyspnea on exertion, orthopnea, palpitations, paroxysmal nocturnal dyspnea. Positive for edema.  Dermatological: No rash, lesions/masses Respiratory: No cough, dyspnea Urologic: No hematuria, dysuria  Abdominal:   No nausea, vomiting, diarrhea, bright red blood per rectum, melena, or hematemesis Neurologic:  No visual changes, wkns, changes in mental status. All other systems reviewed and are otherwise negative except as noted above.   Physical Exam:    VS:  BP 108/66   Pulse 64   Ht 5' 0.75" (1.543 m)   Wt 274 lb (124.3 kg)   BMI 52.20 kg/m     General: Well developed, obese Caucasian male appearing in no  acute distress. Head: Normocephalic, atraumatic, sclera non-icteric, no xanthomas, nares are without discharge.  Neck: No carotid bruits. JVD not elevated.  Lungs: Respirations regular and unlabored, without wheezes or rales.  Heart: Regular rate and rhythm. No S3 or S4.  No murmur, no rubs, or gallops appreciated. Abdomen: Soft, non-tender, non-distended with normoactive bowel sounds. No hepatomegaly. No rebound/guarding. No obvious abdominal masses. Msk:  Strength and tone appear normal for age. No joint deformities or effusions. Extremities: No clubbing or cyanosis. Trace lower extremity edema.  Distal pedal pulses are 2+ bilaterally. Neuro: Alert and oriented X 3. Moves all extremities spontaneously. No focal deficits noted. Psych:  Responds to questions appropriately with a normal affect. Skin: No rashes or lesions noted  Wt Readings from Last 3 Encounters:  09/22/16 274 lb (124.3 kg)  09/14/16 265 lb (120.2 kg)  07/12/16 273 lb 3.2 oz (123.9 kg)      Studies/Labs Reviewed:   EKG:  EKG is ordered today.  The ekg ordered today demonstrates NSR with PAC's, HR 64. No acute ST or T-wave changes.   Recent Labs: 03/15/2016: ALT 19 09/14/2016: B Natriuretic Peptide 476.3; Hemoglobin 12.5; Magnesium 2.0; Platelets 184 09/15/2016: BUN 18; Creatinine, Ser 1.13; Potassium 4.0; Sodium 138   Lipid Panel    Component Value Date/Time   CHOL 118 01/07/2013 0410   TRIG 133 01/07/2013 0410   HDL 40 01/07/2013 0410   CHOLHDL 3.0 01/07/2013 0410   VLDL 27 01/07/2013 0410   LDLCALC 51 01/07/2013 0410    Additional studies/ records that were reviewed today include:   Echocardiogram: 04/2014 Study Conclusions  - Left ventricle: The cavity size was normal. Systolic function was normal. The estimated ejection fraction was in the range of 55% to 60%. Wall motion was normal; there were no regional wall motion abnormalities. There was an increased relative contribution of atrial  contraction to ventricular filling. Doppler parameters are consistent with abnormal left ventricular relaxation (grade 1 diastolic dysfunction). - Aortic valve: Poorly visualized. Valve area (VTI): 1.53 cm^2. Valve area (Vmax): 1.6 cm^2. Valve area (Vmean): 1.88 cm^2. - Aorta: Aortic root dimension: 47 mm (ED). - Aortic root: The aortic root was mild to moderately dilated. - Tricuspid valve: There was trivial regurgitation.  Assessment:    1. Paroxysmal atrial fibrillation (HCC)   2. Anticoagulant long-term use   3. Coronary artery disease involving native coronary artery of native heart without angina pectoris   4. Chronic diastolic heart failure ([place])   5. Essential hypertension   6. Hyperlipidemia LDL goal <70   7. Thoracic aortic aneurysm without rupture ([place])      Plan:   In order of problems listed above:  1. Paroxysmal Atrial Fibrillation/ Long-term Anticoagulation - he has a known history of PAF and was recently admitted for dyspnea and palpitations, found to be in atrial fibrillation with RVR. Underwent successful DCCV with return to NSR.  - he denies any recurrent chest pain, palpitations or dyspnea. Maintaining NSR by EKG today. - This patients  CHA2DS2-VASc Score and unadjusted Ischemic Stroke Rate (% per year) is equal to 9.7 % stroke rate/year from a score of 6 (HTN, DM, Vascular, Age, TE (2)). He denies any evidence of active bleeding. Continue Xarelto for anticoagulation.  - will continue Atenolol 25mg  daily for rate-control. I instructed the patient that if he has recurrent palpitations, he can take an additional tablet.   2. CAD - s/p BMS to 2nd OM in 2012, low-risk NST in 12/2012.  - he denies any recurrent chest pain or dyspnea on exertion since his DCCV. - Continue beta blocker and statin therapy. No ASA secondary to need for Xarelto.  3. Chronic diastolic CHF - echo in 1610 showed a preserved EF of 55-60%. He has trace lower extremity edema on  examination but lungs are clear and weight has been stable on his home scales. - continue current Lasix dosing of 80mg  BID. Creatinine was stable at 1.13 when recently checked on 09/15/2016.  4. HTN - BP is well-controlled at 108/66 during today's visit. - continue Atenolol 25mg  daily, Losartan 25mg  daily, and Spironolactone 25mg  daily.  5. HLD - followed by PCP. Goal LDL is < 70 with known CAD. - continue Atorvastatin 10mg  daily.   6. Thoracic aortic aneurysm  - stable at 5.0 cm by recent CT Scan. - followed by Dr. Servando Snare with CT Surgery.    Medication Adjustments/Labs and Tests Ordered: Current medicines are reviewed at length with the patient today.  Concerns regarding medicines are outlined above.  Medication changes, Labs and Tests ordered today are listed in the Patient Instructions below. Patient Instructions  Medication Instructions:  If you experience palpitations-ok to take additional atenolol (25mg  tablet)  Follow-Up: Your physician recommends that you schedule a follow-up appointment in: November with Dr. Martinique.   Any Other Special Instructions Will Be Listed Below (If Applicable).   If you need a refill on your cardiac medications before your next appointment, please call your pharmacy.    Signed, Erma Heritage, PA-C  09/22/2016 12:44 PM    [place], [place], [place]  [address] Phone: [phone]; Fax: [phone]  [address], [place], [place] [address] Phone: [phone]

## 2016-09-22 ENCOUNTER — Ambulatory Visit (INDEPENDENT_AMBULATORY_CARE_PROVIDER_SITE_OTHER): Payer: Medicare Other | Admitting: Student

## 2016-09-22 ENCOUNTER — Encounter: Payer: Self-pay | Admitting: Student

## 2016-09-22 VITALS — BP 108/66 | HR 64 | Ht 60.75 in | Wt 274.0 lb

## 2016-09-22 DIAGNOSIS — I5032 Chronic diastolic (congestive) heart failure: Secondary | ICD-10-CM

## 2016-09-22 DIAGNOSIS — I10 Essential (primary) hypertension: Secondary | ICD-10-CM

## 2016-09-22 DIAGNOSIS — I712 Thoracic aortic aneurysm, without rupture, unspecified: Secondary | ICD-10-CM

## 2016-09-22 DIAGNOSIS — Z7901 Long term (current) use of anticoagulants: Secondary | ICD-10-CM

## 2016-09-22 DIAGNOSIS — I480 Paroxysmal atrial fibrillation: Secondary | ICD-10-CM | POA: Diagnosis not present

## 2016-09-22 DIAGNOSIS — I2510 Atherosclerotic heart disease of native coronary artery without angina pectoris: Secondary | ICD-10-CM

## 2016-09-22 DIAGNOSIS — E785 Hyperlipidemia, unspecified: Secondary | ICD-10-CM

## 2016-09-22 NOTE — Patient Instructions (Signed)
Medication Instructions:  If you experience palpitations-ok to take additional atenolol (25mg  tablet)  Follow-Up: Your physician recommends that you schedule a follow-up appointment in: November with Dr. Martinique.   Any Other Special Instructions Will Be Listed Below (If Applicable).     If you need a refill on your cardiac medications before your next appointment, please call your pharmacy.

## 2016-09-29 ENCOUNTER — Other Ambulatory Visit: Payer: Self-pay | Admitting: Neurological Surgery

## 2016-09-29 ENCOUNTER — Telehealth: Payer: Self-pay | Admitting: Cardiology

## 2016-09-29 ENCOUNTER — Encounter: Payer: Self-pay | Admitting: Cardiovascular Disease

## 2016-09-29 DIAGNOSIS — M48062 Spinal stenosis, lumbar region with neurogenic claudication: Secondary | ICD-10-CM | POA: Diagnosis not present

## 2016-09-29 NOTE — Telephone Encounter (Signed)
New Message    Would like this back today if possible    [facility] Pre-operative Risk Assessment    Request for surgical clearance:  1. What type of surgery is being performed?  Lumbar Fusion  2. When is this surgery scheduled?  9/12 ?  3. Are there any medications that need to be held prior to surgery and how long? Xarelto - how soon can he stop them and needs surgical clearance   4. Name of physician performing surgery?  Dr Sherley Bounds   5. What is your office phone and fax number?  Fax [phone] ofc [phone] ext [phone]  Carlos Dawson 09/29/2016, 4:03 PM  _________________________________________________________________   (provider comments below)

## 2016-09-29 NOTE — Telephone Encounter (Signed)
He is cleared for surgery and may hold Xarelto for 48 hours prior to surgery.  Sallie Maker Martinique MD, [facility]

## 2016-09-30 ENCOUNTER — Other Ambulatory Visit: Payer: Self-pay | Admitting: Cardiology

## 2016-10-03 NOTE — Telephone Encounter (Signed)
Faxed via Epic to # provided.

## 2016-10-10 ENCOUNTER — Telehealth: Payer: Self-pay | Admitting: Cardiology

## 2016-10-10 NOTE — Telephone Encounter (Signed)
Returned call to patient of Dr. Martinique HR was 139bpm and decreases w/rest His HR gets out of rhythm and HR goes up with minimal exertion/movement He had a DCCV on 8/24 and saw B. Strader on 8/31 for follow up (NSR w/PACs) BP 107/65 He also states has CAD and is wondering if symptoms are r/t another potential blockage He c/o shortness of breath  Offered PA OV 9/19 @ 8am but he cannot make this. Instead scheduled for 10/13/16 @ 11am w/Rhonda, PA

## 2016-10-10 NOTE — Telephone Encounter (Signed)
New message     Pt thinks his heart is out of rhythm again, he just had his heart shocked 3 weeks ago , is this normal please call  Pt states all his vitals are normal , he gets a little pressure when it goes out of rhythm but it comes and goes

## 2016-10-11 ENCOUNTER — Ambulatory Visit: Payer: Medicare Other | Admitting: Physician Assistant

## 2016-10-13 ENCOUNTER — Encounter: Payer: Self-pay | Admitting: Physician Assistant

## 2016-10-13 ENCOUNTER — Ambulatory Visit (INDEPENDENT_AMBULATORY_CARE_PROVIDER_SITE_OTHER): Payer: Medicare Other | Admitting: Physician Assistant

## 2016-10-13 VITALS — BP 96/62 | HR 60 | Ht 66.0 in | Wt 277.6 lb

## 2016-10-13 DIAGNOSIS — Z7901 Long term (current) use of anticoagulants: Secondary | ICD-10-CM

## 2016-10-13 DIAGNOSIS — I5032 Chronic diastolic (congestive) heart failure: Secondary | ICD-10-CM | POA: Diagnosis not present

## 2016-10-13 DIAGNOSIS — I2510 Atherosclerotic heart disease of native coronary artery without angina pectoris: Secondary | ICD-10-CM

## 2016-10-13 DIAGNOSIS — I480 Paroxysmal atrial fibrillation: Secondary | ICD-10-CM | POA: Diagnosis not present

## 2016-10-13 MED ORDER — METOPROLOL TARTRATE 25 MG PO TABS: 25.0000 mg | ORAL_TABLET | Freq: Two times a day (BID) | ORAL | 3 refills | Status: DC

## 2016-10-13 NOTE — Patient Instructions (Signed)
Medication Instructions:  STOP ATENOLOL  START METOPROLOL 25MG  TWICE DAILY- START WITH 1/2 TAB TWICE DAILY X3DAYS THEN WHOLE TAB TWICE DAILY. MAY TAKE AN EXTRA 1/2 TABLET IF BP IS >110   If you need a refill on your cardiac medications before your next appointment, please call your pharmacy.  Follow-Up: Your physician wants you to follow-up: KEEP SCHEDULED APPOINTMENT WITH DR Martinique 12-12-2016 AT 940AM.   Special Instructions: CALL IF YOUR AFIB CONTINUES AND IF HEARTRATE GOES UP  START 2,000MG  LOW SODIUM DIET  LIMIT LIQUID INTAKE TO 2 LITERS (89 1/2 OZ) DAILY  Thank you for choosing [facility] at [facility]!!     Low-Sodium Eating Plan Sodium, which is an element that makes up salt, helps you maintain a healthy balance of fluids in your body. Too much sodium can increase your blood pressure and cause fluid and waste to be held in your body. Your health care provider or dietitian may recommend following this plan if you have high blood pressure (hypertension), kidney disease, liver disease, or heart failure. Eating less sodium can help lower your blood pressure, reduce swelling, and protect your heart, liver, and kidneys. What are tips for following this plan? General guidelines  Most people on this plan should limit their sodium intake to 1,500-2,000 mg (milligrams) of sodium each day. Reading food labels  The Nutrition Facts label lists the amount of sodium in one serving of the food. If you eat more than one serving, you must multiply the listed amount of sodium by the number of servings.  Choose foods with less than 140 mg of sodium per serving.  Avoid foods with 300 mg of sodium or more per serving. Shopping  Look for lower-sodium products, often labeled as "low-sodium" or "no salt added."  Always check the sodium content even if foods are labeled as "unsalted" or "no salt added".  Buy fresh foods. ? Avoid canned foods and premade or frozen meals. ? Avoid canned,  cured, or processed meats  Buy breads that have less than 80 mg of sodium per slice. Cooking  Eat more home-cooked food and less restaurant, buffet, and fast food.  Avoid adding salt when cooking. Use salt-free seasonings or herbs instead of table salt or sea salt. Check with your health care provider or pharmacist before using salt substitutes.  Cook with plant-based oils, such as canola, sunflower, or olive oil. Meal planning  When eating at a restaurant, ask that your food be prepared with less salt or no salt, if possible.  Avoid foods that contain MSG (monosodium glutamate). MSG is sometimes added to [place] food, bouillon, and some canned foods. What foods are recommended? The items listed may not be a complete list. Talk with your dietitian about what dietary choices are best for you. Grains Low-sodium cereals, including oats, puffed wheat and rice, and shredded wheat. Low-sodium crackers. Unsalted rice. Unsalted pasta. Low-sodium bread. Whole-grain breads and whole-grain pasta. Vegetables Fresh or frozen vegetables. "No salt added" canned vegetables. "No salt added" tomato sauce and paste. Low-sodium or reduced-sodium tomato and vegetable juice. Fruits Fresh, frozen, or canned fruit. Fruit juice. Meats and other protein foods Fresh or frozen (no salt added) meat, poultry, seafood, and fish. Low-sodium canned tuna and salmon. Unsalted nuts. Dried peas, beans, and lentils without added salt. Unsalted canned beans. Eggs. Unsalted nut butters. Dairy Milk. Soy milk. Cheese that is naturally low in sodium, such as ricotta cheese, fresh mozzarella, or Swiss cheese Low-sodium or reduced-sodium cheese. Cream cheese. Yogurt. Fats and  oils Unsalted butter. Unsalted margarine with no trans fat. Vegetable oils such as canola or olive oils. Seasonings and other foods Fresh and dried herbs and spices. Salt-free seasonings. Low-sodium mustard and ketchup. Sodium-free salad dressing. Sodium-free  light mayonnaise. Fresh or refrigerated horseradish. Lemon juice. Vinegar. Homemade, reduced-sodium, or low-sodium soups. Unsalted popcorn and pretzels. Low-salt or salt-free chips. What foods are not recommended? The items listed may not be a complete list. Talk with your dietitian about what dietary choices are best for you. Grains Instant hot cereals. Bread stuffing, pancake, and biscuit mixes. Croutons. Seasoned rice or pasta mixes. Noodle soup cups. Boxed or frozen macaroni and cheese. Regular salted crackers. Self-rising flour. Vegetables Sauerkraut, pickled vegetables, and relishes. Olives. [place] fries. Onion rings. Regular canned vegetables (not low-sodium or reduced-sodium). Regular canned tomato sauce and paste (not low-sodium or reduced-sodium). Regular tomato and vegetable juice (not low-sodium or reduced-sodium). Frozen vegetables in sauces. Meats and other protein foods Meat or fish that is salted, canned, smoked, spiced, or pickled. Bacon, ham, sausage, hotdogs, corned beef, chipped beef, packaged lunch meats, salt pork, jerky, pickled herring, anchovies, regular canned tuna, sardines, salted nuts. Dairy Processed cheese and cheese spreads. Cheese curds. Blue cheese. Feta cheese. String cheese. Regular cottage cheese. Buttermilk. Canned milk. Fats and oils Salted butter. Regular margarine. Ghee. Bacon fat. Seasonings and other foods Onion salt, garlic salt, seasoned salt, table salt, and sea salt. Canned and packaged gravies. Worcestershire sauce. Tartar sauce. Barbecue sauce. Teriyaki sauce. Soy sauce, including reduced-sodium. Steak sauce. Fish sauce. Oyster sauce. Cocktail sauce. Horseradish that you find on the shelf. Regular ketchup and mustard. Meat flavorings and tenderizers. Bouillon cubes. Hot sauce and Tabasco sauce. Premade or packaged marinades. Premade or packaged taco seasonings. Relishes. Regular salad dressings. Salsa. Potato and tortilla chips. Corn chips and puffs.  Salted popcorn and pretzels. Canned or dried soups. Pizza. Frozen entrees and pot pies. Summary  Eating less sodium can help lower your blood pressure, reduce swelling, and protect your heart, liver, and kidneys.  Most people on this plan should limit their sodium intake to 1,500-2,000 mg (milligrams) of sodium each day.  Canned, boxed, and frozen foods are high in sodium. Restaurant foods, fast foods, and pizza are also very high in sodium. You also get sodium by adding salt to food.  Try to cook at home, eat more fresh fruits and vegetables, and eat less fast food, canned, processed, or prepared foods. This information is not intended to replace advice given to you by your health care provider. Make sure you discuss any questions you have with your health care provider. Document Released: 07/01/2001 Document Revised: 01/03/2016 Document Reviewed: 01/03/2016 Elsevier Interactive Patient Education  2017 [employer].

## 2016-10-13 NOTE — Progress Notes (Signed)
Cardiology Office Note   Date:  10/13/2016   ID:  Carlos Dawson, DOB [birth year], MRN [id number]  PCP:  Josetta Huddle, MD  Cardiologist:  Dr Martinique,  06/06/2016 B. Strader 09/22/2016 Rosaria Ferries, PA-C   Chief Complaint  Patient presents with  . Chest Pain    pt states some tightness   . Shortness of Breath    some     History of Present Illness: Carlos Dawson is a 76 y.o. male with a history of PAF s/p DCCV on Xarelto, BMS OM2 2012, DM, HTN, IBS, GERD, OSA on CPAP, thoracic Ao aneurysm 5.0 cm, PE 2008, D-CHF, low-risk NST in 12/2012, CHA2DS2-VASc Score and unadjusted Ischemic Stroke Rate (% per year) is equal to 9.7 % stroke rate/year from a score of 7 (HTN, DM, Vascular, Age x 2, PE x 2).   Dr Martinique cleared pt for lumbar fusion 09/07>>he is to have this next week.  09/18 Phone notes regarding palpitations and CP>>appt made  Carlos Dawson presents for cardiology follow up.  He has had PAF for a long time, but stays in [place]. He got up earlier this week with chest tightness, his pulse ox showed a HR 135. Later that day, his HR finally went to the 60s. Over the last couple of days, his HR/BP have been normal.  Today, they went out to eat and when he stood up, had brief near-syncope. He came to the appointment and his BP was 96/52. He did not lose consciousness. He has no history of near-syncope or syncope. He was not having palpitations at the time and does not think he was in atrial fib.  His weight has been between 269-272. Did not weigh today but was 271 yesterday.   He uses some salt and eats out at times. He drinks unsweet tea (2 glasses), at least 48 oz water, and other things.   He feels his DOE is worsening. He wonders if he needs some testing to further evaluate this. He does not exercise. He has not had chest pain except in the setting of rapid atrial fibrillation.  He takes several of his blood pressure medications at bedtime. He takes his am medications  about 7 AM, significantly before he eats. He and his wife usually have brunch at 9:30 or 10. Today, they want breakfast before the appointment. He had taken his morning medications earlier. It is quite possible he did not have as much water to drink as he usually does in the mornings before breakfast.   Past Medical History:  Diagnosis Date  . Anticoagulant long-term use   . Aortic root enlargement ([place])   . Ascending aortic aneurysm [facility])    recent scan in October 2012 showing no change; followed by Dr. Servando Snare  . ASCVD (arteriosclerotic cardiovascular disease)    Prior BMS to the 2nd OM in September 2012; with repeat cath in October showing patency  . CAD (coronary artery disease)    a. s/p BMS to 2nd OM in Sept 2012; b. LexiScan Myoview (12/2012):  Inf infarct; bowel and motion artifact make study difficult to interpret; no ischemia; not gated; Low Risk  . CHF (congestive heart failure) ([place])    no recent issues 10/13/14  . Colonic polyp   . Contact lens/glasses fitting   . Diabetes mellitus without complication (HCC)    metphormin, average 154 dx 2017  . Diastolic dysfunction   . Generalized headaches    neck stenosis  . GERD (gastroesophageal reflux  disease)   . Hearing loss   . Hearing loss    more so on left  . Hemorrhoids   . Hypertension   . IBS (irritable bowel syndrome)   . LVH (left ventricular hypertrophy)   . Mild intermittent asthma   . OA (osteoarthritis)   . Obesities, morbid ([place])   . OSA (obstructive sleep apnea)    PSG 03/30/97 AHI 21, BPAP 13/9  . PAF (paroxysmal atrial fibrillation) ([place])    a. on Xarelto b. s/p DCCV in 08/2016  . Pneumonia   . Prostate CA [facility])    Oncologist  DR. Daralene [facility] 09/24/14, undetermined tx   prostate  . Pulmonary embolism ([place])    2008  . Sleep apnea   . SOB (shortness of breath)    on excertion  . Thoracic aortic aneurysm (HCC)    Aortic Size Index=     5.0    /Body surface area is 2.43 meters squared. = 2.05  <  2.75 cm/m2      4% risk per year 2.75 to 4.25          8% risk per year > 4.25 cm/m2    20% risk per year   Stable aneurysmal dilation of the ascending aorta with maximum AP diameter of 4.8 cm. Stable area of narrowing of the proximal most portion of the descending aorta measuring 2 cm., previously identified as an area of coarctation. No evidence of aortic dissection.  Coronary artery disease.  Normal appearance of the lungs.   Electronically Signed   By: Fidela Salisbury M.D.   On: 10/01/2014 08:50      Past Surgical History:  Procedure Laterality Date  . ACHILLES TENDON REPAIR    . APPENDECTOMY    . CARDIAC CATHETERIZATION  2006  . CARDIAC CATHETERIZATION  October 2012   Stent patent  . CARPAL TUNNEL RELEASE     LEFT  . CERVICAL SPINE SURGERY  06/02/2010   lower back and neck  . CHOLECYSTECTOMY    . COLONOSCOPY WITH PROPOFOL N/A 12/29/2014   Procedure: COLONOSCOPY WITH PROPOFOL;  Surgeon: Garlan Fair, MD;  Location: [facility];  Service: Endoscopy;  Laterality: N/A;  . CORONARY STENT PLACEMENT  Sept 2012   2nd OM with BMS  . EYE SURGERY     bilateral cataract  . HEMORROIDECTOMY    . LAMINECTOMY  05/30/2012   L 4 L5  . LAPAROSCOPIC GASTRIC BANDING    . LEFT AND RIGHT HEART CATHETERIZATION WITH CORONARY ANGIOGRAM N/A 05/07/2014   Procedure: LEFT AND RIGHT HEART CATHETERIZATION WITH CORONARY ANGIOGRAM;  Surgeon: [name] Martinique, MD;  Location: [facility] CATH LAB;  Service: Cardiovascular;  Laterality: N/A;  . LUMBAR LAMINECTOMY/DECOMPRESSION MICRODISCECTOMY N/A 05/04/2016   Procedure: Laminectomy and Foraminotomy - Thoracic twelve-Lumbar one -Posterior Fusion Lumbar one-two;  Surgeon: Eustace Moore, MD;  Location: [place];  Service: Neurosurgery;  Laterality: N/A;  . ROTATOR CUFF REPAIR     both  . TONSILLECTOMY    . TRIGGER FINGER RELEASE     LEFT  . UVULOPALATOPHARYNGOPLASTY    . VASECTOMY      Current Outpatient Prescriptions  Medication Sig Dispense Refill  . acetaminophen  (TYLENOL) 500 MG tablet Take 1,000 mg by mouth every 8 (eight) hours as needed for mild pain.     Marland Kitchen albuterol (PROVENTIL HFA;VENTOLIN HFA) 108 (90 Base) MCG/ACT inhaler Inhale 2 puffs into the lungs every 4 (four) hours as needed for wheezing or shortness of breath.    Marland Kitchen  atenolol (TENORMIN) 25 MG tablet Take 25 mg by mouth every morning.     Marland Kitchen atorvastatin (LIPITOR) 10 MG tablet Take 10 mg by mouth daily at 6 PM.     . budesonide-formoterol (SYMBICORT) 160-4.5 MCG/ACT inhaler Inhale 2 puffs into the lungs 2 (two) times daily as needed (for shortness of breath).     . celecoxib (CELEBREX) 200 MG capsule Take 200 mg by mouth 2 (two) times daily.    . Cholecalciferol (VITAMIN D-3) 5000 units TABS Take 5,000 Units by mouth daily.    . fluticasone (FLONASE) 50 MCG/ACT nasal spray Place 2 sprays into both nostrils daily as needed for allergies or rhinitis.    . furosemide (LASIX) 40 MG tablet Take 2 tablets (80 mg total) by mouth 2 (two) times daily. 30 tablet 0  . losartan (COZAAR) 25 MG tablet Take 25 mg by mouth daily.    . metFORMIN (GLUCOPHAGE-XR) 500 MG 24 hr tablet Take 500 mg by mouth daily before supper.    . methocarbamol (ROBAXIN) 500 MG tablet Take 1 tablet (500 mg total) by mouth 2 (two) times daily as needed for muscle spasms. 20 tablet 0  . montelukast (SINGULAIR) 10 MG tablet Take 10 mg by mouth at bedtime.    . nitroGLYCERIN (NITROSTAT) 0.4 MG SL tablet Place 1 tablet (0.4 mg total) under the tongue every 5 (five) minutes as needed for chest pain. 25 tablet 5  . pantoprazole (PROTONIX) 40 MG tablet Take 1 tablet (40 mg total) by mouth daily. 30 tablet 11  . Polyethyl Glycol-Propyl Glycol (SYSTANE OP) Place 1 drop into both eyes as needed (for dry eyes).    . potassium chloride SA (KLOR-CON M20) 20 MEQ tablet Take 2 tablets (40 mEq total) by mouth 2 (two) times daily. 360 tablet 2  . psyllium (METAMUCIL) 58.6 % packet Take 1 packet by mouth daily.    . rivaroxaban (XARELTO) 20 MG TABS  tablet Take 20 mg by mouth daily with supper.     . sildenafil (VIAGRA) 100 MG tablet Take 100 mg by mouth as needed for erectile dysfunction.     . Skin Protectants, Misc. (EUCERIN) cream Apply 1 application topically as needed for dry skin.    Marland Kitchen spironolactone (ALDACTONE) 25 MG tablet Take 25 mg by mouth at bedtime.     . tamsulosin (FLOMAX) 0.4 MG CAPS Take 0.4 mg by mouth every evening.     . topiramate (TOPAMAX) 25 MG capsule Take 50 mg by mouth 2 (two) times daily.     . traMADol (ULTRAM) 50 MG tablet Take 50 mg by mouth every 12 (twelve) hours as needed for moderate pain.      No current facility-administered medications for this visit.     Allergies:   Adhesive [tape]; Latex; and Morphine    Social History:  The patient  reports that he quit smoking about 24 years ago. He has a 45.00 pack-year smoking history. He has never used smokeless tobacco. He reports that he does not drink alcohol or use drugs.   Family History:  The patient's family history includes Diabetes in his mother; Emphysema (age of onset: 42) in his father; Heart attack in his sister; Heart disease in his mother; Other in his mother.    ROS:  Please see the history of present illness. All other systems are reviewed and negative.    PHYSICAL EXAM: VS:  BP (!) 96/52   Pulse 60   Ht 5\' 6"  (1.676 m)  Wt 277 lb 9.6 oz (125.9 kg)   BMI 44.81 kg/m  , BMI Body mass index is 44.81 kg/m. GEN: Well nourished, well developed, male in no acute distress  HEENT: normal for age  Neck: no JVD seen but difficult to assess secondary to body habitus, no carotid bruit, no masses Cardiac: RRR; 2/6 murmur, no rubs, or gallops Respiratory:  clear to auscultation bilaterally, normal work of breathing GI: soft, nontender, nondistended, + BS MS: no deformity or atrophy; trace edema; distal pulses are 2+ in all 4 extremities   Skin: warm and dry, no rash Neuro:  Strength and sensation are intact Psych: euthymic mood, full  affect   EKG:  EKG is ordered today. The ekg ordered today demonstrates sinus rhythm, sinus bradycardia with a heart rate of 59, occasional PVCs, minimal change from 09/22/2016 ECG  ECHO: 04/27/2014 - Left ventricle: The cavity size was normal. Systolic function was normal. The estimated ejection fraction was in the range of 55% to 60%. Wall motion was normal; there were no regional wall motion abnormalities. There was an increased relative contribution of atrial contraction to ventricular filling. Doppler parameters are consistent with abnormal left ventricular relaxation (grade 1 diastolic dysfunction). - Aortic valve: Poorly visualized. Valve area (VTI): 1.53 cm^2. Valve area (Vmax): 1.6 cm^2. Valve area (Vmean): 1.88 cm^2. - Aorta: Aortic root dimension: 47 mm (ED). - Aortic root: The aortic root was mild to moderately dilated. - Tricuspid valve: There was trivial regurgitation.  RIGHT/LEFT CARDIAC CATH: 05/07/2014 Procedural Findings: Hemodynamics RA 16/17 mean 14 mm Hg RV 47/12/17 mm Hg PA 48/15 mean 31 mm Hg PCWP 25/24 mean 22 mm Hg LV N/A AO 103/63 mean 80 mm Hg Oxygen saturations: PA 61% AO 91% Cardiac Output (Fick) 5.7 L/min  Cardiac Index (Fick) 2.47 L/min/meter squared.          Coronary angiography: Coronary dominance: left Left mainstem: The left main is a shared ostia for the LAD and LCx. It is normal. Left anterior descending (LAD): The LAD is a relatively small branch giving off one diagonal branch. It is normal. Left circumflex (LCx): The LCx is a large dominant vessel without significant disease. The stented segment of the first OM is widely patent. Right coronary artery (RCA): The RCA is small and nondominant with a large conus branch. It is normal.  Left ventriculography: Not done Final Conclusions:   1. No obstructive CAD. The OM stent is widely patent.  2. Mild pulmonary HTN with elevated LV filling  pressures. Recommendations: will increase diuretic therapy. Otherwise continue medication. Resume anticoagulant therapy.   Recent Labs: 03/15/2016: ALT 19 09/14/2016: B Natriuretic Peptide 476.3; Hemoglobin 12.5; Magnesium 2.0; Platelets 184 09/15/2016: BUN 18; Creatinine, Ser 1.13; Potassium 4.0; Sodium 138    Lipid Panel    Component Value Date/Time   CHOL 118 01/07/2013 0410   TRIG 133 01/07/2013 0410   HDL 40 01/07/2013 0410   CHOLHDL 3.0 01/07/2013 0410   VLDL 27 01/07/2013 0410   LDLCALC 51 01/07/2013 0410     Wt Readings from Last 3 Encounters:  10/13/16 277 lb 9.6 oz (125.9 kg)  09/22/16 274 lb (124.3 kg)  09/14/16 265 lb (120.2 kg)     Other studies Reviewed: Additional studies/ records that were reviewed today include: Office notes, hospital records and testing.  ASSESSMENT AND PLAN:  1.  Chronic diastolic CHF: His weight is at baseline. His respiratory status is good. He is on a beta blocker, but the atenolol the short acting and  his blood pressures low today. I will change him to metoprolol 25 mg bid and have him start with half tablet bid. If this can be up titrated, it should be. If not, I would prefer to keep his blood pressure approximately 100 or better.  He is encouraged to limit his sodium intake as well has do some limitations on his liquid intake. His diuretic dosage has not changed recently and his labs were good last month. He has labs ordered by his PCP in the system.  2. PAF: Continue beta blocker as tolerated. He is not having symptoms of A. fib today, his heart rate is slightly irregular, but it is sinus on ECG. He was given information on the [employer] to her and may get this. This would help [place] determine his atrial fib burden.  3. Chronic anticoagulation: He is not having any signs or symptoms of bleeding on the Xarelto.  4. CAD: His last heart catheterization in 2016 showed nonobstructive disease and mild pulmonary hypertension. His exertion level is  poor. He has significant deconditioning and some chronic dyspnea on exertion because of his CHF. Really time he gets chest pain is when his heart rate goes over 130. At this time, I do not feel strongly that stress testing or repeat catheterization is indicated.   Current medicines are reviewed at length with the patient today.  The patient does not have concerns regarding medicines.  The following changes have been made:  Change atenolol to metoprolol  Labs/ tests ordered today include:  No orders of the defined types were placed in this encounter.   Disposition:   FU with Dr Martinique  Signed, Rosaria Ferries, PA-C  10/13/2016 11:16 AM    [place] Phone: [phone]; Fax: [phone]  This note was written with the assistance of speech recognition software. Please excuse any transcriptional errors.

## 2016-10-13 NOTE — Pre-Procedure Instructions (Signed)
Carlos Dawson  10/13/2016      [id number] - [place], [place] [address] AT [address] [place]. [place] [address] Phone: [phone] Fax: [phone]  [facility] Carlos Dawson, Carlos [address] AT [facility] [address] [place] [address] Phone: [phone] Fax: [phone]    Your procedure is scheduled on Sept 26  Report to [place] at [phone].  Call this number if you have problems the morning of surgery:  [phone]   Remember:  Do not eat food or drink liquids after midnight.  Take these medicines the morning of surgery with A SIP OF WATER  Albuterol inhaler if needed, Symbicort inhaler if needed, Flonase nasal spray if needed, Methocarbamol (Robaxin) if needed, metoprolol tartrate (Lopressor), Nitrostat if needed, pantoprazole (Protonix), Systane eye drops if needed, Topiramate (Topamax) Tramadol (Ultram) if needed  Bring your inhalers with you on the day of surgery  Stop taking Xarelto as directed by your Dr.   Stop taking aspirin, BC's, Goody's, herbal medications, Fish Oil, vitamins, Ibuprofen, Advil, Motrin, Aleve, Celebrex    How to Manage Your Diabetes Before and After Surgery  Why is it important to control my blood sugar before and after surgery? . Improving blood sugar levels before and after surgery helps healing and can limit problems. . A way of improving blood sugar control is eating a healthy diet by: o  Eating less sugar and carbohydrates o  Increasing activity/exercise o  Talking with your doctor about reaching your blood sugar goals . High blood sugars (greater than 180 mg/dL) can raise your risk of infections and slow your recovery, so you will need to focus on controlling your diabetes during the weeks before surgery. . Make sure that the doctor who takes care of your diabetes knows about your planned surgery  including the date and location.  How do I manage my blood sugar before surgery? . Check your blood sugar at least 4 times a day, starting 2 days before surgery, to make sure that the level is not too high or low. o Check your blood sugar the morning of your surgery when you wake up and every 2 hours until you get to the Short Stay unit. . If your blood sugar is less than 70 mg/dL, you will need to treat for low blood sugar: o Do not take insulin. o Treat a low blood sugar (less than 70 mg/dL) with  cup of clear juice (cranberry or apple), 4 glucose tablets, OR glucose gel. o Recheck blood sugar in 15 minutes after treatment (to make sure it is greater than 70 mg/dL). If your blood sugar is not greater than 70 mg/dL on recheck, call [phone] for further instructions. . Report your blood sugar to the short stay nurse when you get to Short Stay.  . If you are admitted to the hospital after surgery: o Your blood sugar will be checked by the staff and you will probably be given insulin after surgery (instead of oral diabetes medicines) to make sure you have good blood sugar levels. o The goal for blood sugar control after surgery is 80-180 mg/dL.              WHAT DO I DO ABOUT MY DIABETES MEDICATION?   Marland Kitchen Do not take oral diabetes medicines (pills) the morning of surgery. Metformin (Glucophage)       . The  day of surgery, do not take other diabetes injectables, including Byetta (exenatide), Bydureon (exenatide ER), Victoza (liraglutide), or Trulicity (dulaglutide).  . If your CBG is greater than 220 mg/dL, you may take  of your sliding scale (correction) dose of insulin.  Other Instructions:          Patient Signature:  Date:   Nurse Signature:  Date:   Reviewed and Endorsed by [facility] Patient Education Committee, August 2015  Do not wear jewelry, make-up or nail polish.  Do not wear lotions, powders, or perfumes, or deoderant.  Do not shave 48 hours prior to  surgery.  Men may shave face and neck.  Do not bring valuables to the hospital.  [facility] is not responsible for any belongings or valuables.  Contacts, dentures or bridgework may not be worn into surgery.  Leave your suitcase in the car.  After surgery it may be brought to your room.  For patients admitted to the hospital, discharge time will be determined by your treatment team.  Patients discharged the day of surgery will not be allowed to drive home.    Special instructions:  [place] - Preparing for Surgery  Before surgery, you can play an important role.  Because skin is not sterile, your skin needs to be as free of germs as possible.  You can reduce the number of germs on you skin by washing with CHG (chlorahexidine gluconate) soap before surgery.  CHG is an antiseptic cleaner which kills germs and bonds with the skin to continue killing germs even after washing.  Please DO NOT use if you have an allergy to CHG or antibacterial soaps.  If your skin becomes reddened/irritated stop using the CHG and inform your nurse when you arrive at Short Stay.  Do not shave (including legs and underarms) for at least 48 hours prior to the first CHG shower.  You may shave your face.  Please follow these instructions carefully:   1.  Shower with CHG Soap the night before surgery and the  morning of Surgery.  2.  If you choose to wash your hair, wash your hair first as usual with your  normal shampoo.  3.  After you shampoo, rinse your hair and body thoroughly to remove the  Shampoo.  4.  Use CHG as you would any other liquid soap.  You can apply chg directly  to the skin and wash gently with scrungie or a clean washcloth.  5.  Apply the CHG Soap to your body ONLY FROM THE NECK DOWN.   Do not use on open wounds or open sores.  Avoid contact with your eyes, ears, mouth and genitals (private parts).  Wash genitals (private parts)  with your normal soap.  6.  Wash thoroughly, paying special  attention to the area where your surgery will be performed.  7.  Thoroughly rinse your body with warm water from the neck down.  8.  DO NOT shower/wash with your normal soap after using and rinsing off  the CHG Soap.  9.  Pat yourself dry with a clean towel.            10.  Wear clean pajamas.            11.  Place clean sheets on your bed the night of your first shower and do not  sleep with pets.  Day of Surgery  Do not apply any lotions/deoderants the morning of surgery.  Please wear clean clothes to the  hospital/surgery center.     Please read over the following fact sheets that you were given. Pain Booklet, Coughing and Deep Breathing, MRSA Information and Surgical Site Infection Prevention

## 2016-10-16 ENCOUNTER — Ambulatory Visit (HOSPITAL_COMMUNITY)
Admission: RE | Admit: 2016-10-16 | Discharge: 2016-10-16 | Disposition: A | Discharge status: Home / Self Care | Payer: Medicare Other | Source: Ambulatory Visit | Attending: Neurological Surgery | Admitting: Neurological Surgery

## 2016-10-16 ENCOUNTER — Encounter (HOSPITAL_COMMUNITY)
Admission: RE | Admit: 2016-10-16 | Discharge: 2016-10-16 | Disposition: A | Discharge status: Home / Self Care | Payer: Medicare Other | Source: Ambulatory Visit | Attending: Neurological Surgery | Admitting: Neurological Surgery

## 2016-10-16 ENCOUNTER — Encounter (HOSPITAL_COMMUNITY): Payer: Self-pay

## 2016-10-16 DIAGNOSIS — M48061 Spinal stenosis, lumbar region without neurogenic claudication: Secondary | ICD-10-CM

## 2016-10-16 DIAGNOSIS — Z01818 Encounter for other preprocedural examination: Secondary | ICD-10-CM

## 2016-10-16 DIAGNOSIS — I10 Essential (primary) hypertension: Secondary | ICD-10-CM | POA: Diagnosis not present

## 2016-10-16 LAB — CBC WITH DIFFERENTIAL/PLATELET
Basophils Absolute: 0 10*3/uL (ref 0.0–0.1)
Basophils Relative: 1 %
Eosinophils Absolute: 0.1 10*3/uL (ref 0.0–0.7)
Eosinophils Relative: 2 %
HCT: 38.6 % — ABNORMAL LOW (ref 39.0–52.0)
Hemoglobin: 12.3 g/dL — ABNORMAL LOW (ref 13.0–17.0)
Lymphocytes Relative: 30 %
Lymphs Abs: 2.3 10*3/uL (ref 0.7–4.0)
MCH: 27.8 pg (ref 26.0–34.0)
MCHC: 31.9 g/dL (ref 30.0–36.0)
MCV: 87.3 fL (ref 78.0–100.0)
Monocytes Absolute: 0.9 10*3/uL (ref 0.1–1.0)
Monocytes Relative: 11 %
Neutro Abs: 4.2 10*3/uL (ref 1.7–7.7)
Neutrophils Relative %: 56 %
Platelets: 162 10*3/uL (ref 150–400)
RBC: 4.42 MIL/uL (ref 4.22–5.81)
RDW: 15.4 % (ref 11.5–15.5)
WBC: 7.5 10*3/uL (ref 4.0–10.5)

## 2016-10-16 LAB — BASIC METABOLIC PANEL
Anion gap: 9 (ref 5–15)
BUN: 18 mg/dL (ref 6–20)
CO2: 23 mmol/L (ref 22–32)
Calcium: 8.8 mg/dL — ABNORMAL LOW (ref 8.9–10.3)
Chloride: 109 mmol/L (ref 101–111)
Creatinine, Ser: 1.19 mg/dL (ref 0.61–1.24)
GFR calc Af Amer: >60 mL/min (ref >60)
GFR calc non Af Amer: 58 mL/min — ABNORMAL LOW (ref >60)
Glucose, Bld: 152 mg/dL — ABNORMAL HIGH (ref 65–99)
Potassium: 3.6 mmol/L (ref 3.5–5.1)
Sodium: 141 mmol/L (ref 135–145)

## 2016-10-16 LAB — TYPE AND SCREEN
ABO/RH(D): O POS
Antibody Screen: NEGATIVE

## 2016-10-16 LAB — HEMOGLOBIN A1C
Hgb A1c MFr Bld: 7.4 % — ABNORMAL HIGH (ref 4.8–5.6)
Mean Plasma Glucose: 165.68 mg/dL

## 2016-10-16 LAB — GLUCOSE, CAPILLARY: Glucose-Capillary: 145 mg/dL — ABNORMAL HIGH (ref 65–99)

## 2016-10-16 LAB — PROTIME-INR
INR: 1.16
Prothrombin Time: 14.7 seconds (ref 11.4–15.2)

## 2016-10-16 LAB — SURGICAL PCR SCREEN
MRSA, PCR: NEGATIVE
Staphylococcus aureus: NEGATIVE

## 2016-10-17 MED ORDER — DEXTROSE 5 % IV SOLN
3.0000 g | INTRAVENOUS | Status: AC
  Administered 2016-10-18 (×2): 3 g via INTRAVENOUS
  Filled 2016-10-17: qty 3000

## 2016-10-18 ENCOUNTER — Inpatient Hospital Stay (HOSPITAL_COMMUNITY): Payer: Medicare Other | Admitting: Anesthesiology

## 2016-10-18 ENCOUNTER — Encounter (HOSPITAL_COMMUNITY): Payer: Self-pay | Admitting: General Practice

## 2016-10-18 ENCOUNTER — Inpatient Hospital Stay (HOSPITAL_COMMUNITY): Payer: Medicare Other

## 2016-10-18 ENCOUNTER — Inpatient Hospital Stay (HOSPITAL_COMMUNITY)
Admission: RE | Admit: 2016-10-18 | Discharge: 2016-10-20 | DRG: 460 | Disposition: A | Discharge status: Home / Self Care | Payer: Medicare Other | Source: Ambulatory Visit | Attending: Neurological Surgery | Admitting: Neurological Surgery

## 2016-10-18 ENCOUNTER — Encounter (HOSPITAL_COMMUNITY)
Admission: RE | Disposition: A | Discharge status: Home / Self Care | Payer: Self-pay | Source: Ambulatory Visit | Attending: Neurological Surgery

## 2016-10-18 DIAGNOSIS — Z8546 Personal history of malignant neoplasm of prostate: Secondary | ICD-10-CM | POA: Diagnosis not present

## 2016-10-18 DIAGNOSIS — I714 Abdominal aortic aneurysm, without rupture: Secondary | ICD-10-CM | POA: Diagnosis present

## 2016-10-18 DIAGNOSIS — G4733 Obstructive sleep apnea (adult) (pediatric): Secondary | ICD-10-CM | POA: Diagnosis present

## 2016-10-18 DIAGNOSIS — Z9884 Bariatric surgery status: Secondary | ICD-10-CM

## 2016-10-18 DIAGNOSIS — Z6841 Body Mass Index (BMI) 40.0 and over, adult: Secondary | ICD-10-CM | POA: Diagnosis not present

## 2016-10-18 DIAGNOSIS — M4326 Fusion of spine, lumbar region: Secondary | ICD-10-CM | POA: Diagnosis not present

## 2016-10-18 DIAGNOSIS — R2689 Other abnormalities of gait and mobility: Secondary | ICD-10-CM | POA: Diagnosis present

## 2016-10-18 DIAGNOSIS — Z87891 Personal history of nicotine dependence: Secondary | ICD-10-CM | POA: Diagnosis not present

## 2016-10-18 DIAGNOSIS — Z981 Arthrodesis status: Secondary | ICD-10-CM

## 2016-10-18 DIAGNOSIS — I110 Hypertensive heart disease with heart failure: Secondary | ICD-10-CM | POA: Diagnosis present

## 2016-10-18 DIAGNOSIS — M79606 Pain in leg, unspecified: Secondary | ICD-10-CM | POA: Diagnosis present

## 2016-10-18 DIAGNOSIS — Z955 Presence of coronary angioplasty implant and graft: Secondary | ICD-10-CM

## 2016-10-18 DIAGNOSIS — I480 Paroxysmal atrial fibrillation: Secondary | ICD-10-CM | POA: Diagnosis not present

## 2016-10-18 DIAGNOSIS — J4520 Mild intermittent asthma, uncomplicated: Secondary | ICD-10-CM | POA: Diagnosis not present

## 2016-10-18 DIAGNOSIS — M48061 Spinal stenosis, lumbar region without neurogenic claudication: Secondary | ICD-10-CM | POA: Diagnosis not present

## 2016-10-18 DIAGNOSIS — I2510 Atherosclerotic heart disease of native coronary artery without angina pectoris: Secondary | ICD-10-CM | POA: Diagnosis present

## 2016-10-18 DIAGNOSIS — Z91048 Other nonmedicinal substance allergy status: Secondary | ICD-10-CM

## 2016-10-18 DIAGNOSIS — Z79899 Other long term (current) drug therapy: Secondary | ICD-10-CM

## 2016-10-18 DIAGNOSIS — Z7984 Long term (current) use of oral hypoglycemic drugs: Secondary | ICD-10-CM

## 2016-10-18 DIAGNOSIS — I509 Heart failure, unspecified: Secondary | ICD-10-CM | POA: Diagnosis present

## 2016-10-18 DIAGNOSIS — Z7901 Long term (current) use of anticoagulants: Secondary | ICD-10-CM

## 2016-10-18 DIAGNOSIS — M47816 Spondylosis without myelopathy or radiculopathy, lumbar region: Secondary | ICD-10-CM | POA: Diagnosis not present

## 2016-10-18 DIAGNOSIS — Z7951 Long term (current) use of inhaled steroids: Secondary | ICD-10-CM

## 2016-10-18 DIAGNOSIS — Z419 Encounter for procedure for purposes other than remedying health state, unspecified: Secondary | ICD-10-CM

## 2016-10-18 DIAGNOSIS — I712 Thoracic aortic aneurysm, without rupture: Secondary | ICD-10-CM | POA: Diagnosis present

## 2016-10-18 DIAGNOSIS — I739 Peripheral vascular disease, unspecified: Secondary | ICD-10-CM | POA: Diagnosis present

## 2016-10-18 DIAGNOSIS — E1151 Type 2 diabetes mellitus with diabetic peripheral angiopathy without gangrene: Secondary | ICD-10-CM | POA: Diagnosis present

## 2016-10-18 DIAGNOSIS — K219 Gastro-esophageal reflux disease without esophagitis: Secondary | ICD-10-CM | POA: Diagnosis present

## 2016-10-18 DIAGNOSIS — Z885 Allergy status to narcotic agent status: Secondary | ICD-10-CM

## 2016-10-18 DIAGNOSIS — M1990 Unspecified osteoarthritis, unspecified site: Secondary | ICD-10-CM | POA: Diagnosis present

## 2016-10-18 DIAGNOSIS — M4802 Spinal stenosis, cervical region: Secondary | ICD-10-CM | POA: Diagnosis present

## 2016-10-18 DIAGNOSIS — R51 Headache: Secondary | ICD-10-CM | POA: Diagnosis present

## 2016-10-18 DIAGNOSIS — M545 Low back pain: Secondary | ICD-10-CM | POA: Diagnosis not present

## 2016-10-18 DIAGNOSIS — H9190 Unspecified hearing loss, unspecified ear: Secondary | ICD-10-CM | POA: Diagnosis present

## 2016-10-18 DIAGNOSIS — Z91040 Latex allergy status: Secondary | ICD-10-CM

## 2016-10-18 DIAGNOSIS — Z791 Long term (current) use of non-steroidal anti-inflammatories (NSAID): Secondary | ICD-10-CM

## 2016-10-18 DIAGNOSIS — Z86711 Personal history of pulmonary embolism: Secondary | ICD-10-CM

## 2016-10-18 HISTORY — PX: POSTERIOR LUMBAR FUSION: SHX6036

## 2016-10-18 HISTORY — PX: LAMINECTOMY WITH POSTERIOR LATERAL ARTHRODESIS LEVEL 3: SHX6337

## 2016-10-18 LAB — GLUCOSE, CAPILLARY
Glucose-Capillary: 165 mg/dL — ABNORMAL HIGH (ref 65–99)
Glucose-Capillary: 167 mg/dL — ABNORMAL HIGH (ref 65–99)
Glucose-Capillary: 230 mg/dL — ABNORMAL HIGH (ref 65–99)

## 2016-10-18 SURGERY — LAMINECTOMY WITH POSTERIOR LATERAL ARTHRODESIS LEVEL 3
Anesthesia: General | Site: Spine Lumbar

## 2016-10-18 MED ORDER — METHOCARBAMOL 500 MG PO TABS
ORAL_TABLET | ORAL | Status: AC
  Filled 2016-10-18: qty 1

## 2016-10-18 MED ORDER — CELECOXIB 200 MG PO CAPS
200.0000 mg | ORAL_CAPSULE | Freq: Two times a day (BID) | ORAL | Status: DC
  Administered 2016-10-18 – 2016-10-20 (×3): 200 mg via ORAL
  Filled 2016-10-18 (×4): qty 1

## 2016-10-18 MED ORDER — THROMBIN 5000 UNITS EX SOLR
CUTANEOUS | Status: AC
  Filled 2016-10-18: qty 5000

## 2016-10-18 MED ORDER — VANCOMYCIN HCL 1000 MG IV SOLR
INTRAVENOUS | Status: AC
  Filled 2016-10-18: qty 1000

## 2016-10-18 MED ORDER — METFORMIN HCL ER 500 MG PO TB24
500.0000 mg | ORAL_TABLET | Freq: Every day | ORAL | Status: DC
  Administered 2016-10-18 – 2016-10-19 (×2): 500 mg via ORAL
  Filled 2016-10-18 (×2): qty 1

## 2016-10-18 MED ORDER — OXYCODONE HCL 5 MG PO TABS
5.0000 mg | ORAL_TABLET | ORAL | Status: DC | PRN
  Administered 2016-10-18 – 2016-10-19 (×3): 5 mg via ORAL
  Administered 2016-10-19: 10 mg via ORAL
  Filled 2016-10-18 (×2): qty 2
  Filled 2016-10-18: qty 1

## 2016-10-18 MED ORDER — CHLORHEXIDINE GLUCONATE CLOTH 2 % EX PADS: 6.0000 | MEDICATED_PAD | Freq: Once | CUTANEOUS | Status: DC

## 2016-10-18 MED ORDER — BUPIVACAINE HCL (PF) 0.5 % IJ SOLN
INTRAMUSCULAR | Status: DC | PRN
  Administered 2016-10-18: 7 mL

## 2016-10-18 MED ORDER — MIDAZOLAM HCL 5 MG/5ML IJ SOLN
INTRAMUSCULAR | Status: DC | PRN
  Administered 2016-10-18: 1.5 mg via INTRAVENOUS

## 2016-10-18 MED ORDER — SUGAMMADEX SODIUM 500 MG/5ML IV SOLN
INTRAVENOUS | Status: DC | PRN
  Administered 2016-10-18: 250 mg via INTRAVENOUS

## 2016-10-18 MED ORDER — METOPROLOL TARTRATE 25 MG PO TABS
25.0000 mg | ORAL_TABLET | Freq: Two times a day (BID) | ORAL | Status: DC
  Administered 2016-10-18 – 2016-10-20 (×2): 25 mg via ORAL
  Filled 2016-10-18 (×4): qty 1

## 2016-10-18 MED ORDER — NITROGLYCERIN 0.4 MG SL SUBL: 0.4000 mg | SUBLINGUAL_TABLET | SUBLINGUAL | Status: DC | PRN

## 2016-10-18 MED ORDER — OXYCODONE HCL 5 MG/5ML PO SOLN: 5.0000 mg | Freq: Once | ORAL | Status: AC | PRN

## 2016-10-18 MED ORDER — MOMETASONE FURO-FORMOTEROL FUM 200-5 MCG/ACT IN AERO
2.0000 | INHALATION_SPRAY | Freq: Two times a day (BID) | RESPIRATORY_TRACT | Status: DC
  Filled 2016-10-18: qty 8.8

## 2016-10-18 MED ORDER — PHENYLEPHRINE HCL 10 MG/ML IJ SOLN
INTRAMUSCULAR | Status: DC | PRN
  Administered 2016-10-18: 80 ug via INTRAVENOUS
  Administered 2016-10-18: 40 ug via INTRAVENOUS

## 2016-10-18 MED ORDER — MONTELUKAST SODIUM 10 MG PO TABS
10.0000 mg | ORAL_TABLET | Freq: Every day | ORAL | Status: DC
  Administered 2016-10-18 – 2016-10-19 (×2): 10 mg via ORAL
  Filled 2016-10-18 (×2): qty 1

## 2016-10-18 MED ORDER — PROPOFOL 10 MG/ML IV BOLUS
INTRAVENOUS | Status: AC
  Filled 2016-10-18: qty 20

## 2016-10-18 MED ORDER — 0.9 % SODIUM CHLORIDE (POUR BTL) OPTIME
TOPICAL | Status: DC | PRN
  Administered 2016-10-18: 1000 mL

## 2016-10-18 MED ORDER — LIDOCAINE HCL (CARDIAC) 20 MG/ML IV SOLN
INTRAVENOUS | Status: DC | PRN
  Administered 2016-10-18: 100 mg via INTRAVENOUS
  Administered 2016-10-18 (×3): 50 mg via INTRAVENOUS

## 2016-10-18 MED ORDER — SUCCINYLCHOLINE CHLORIDE 200 MG/10ML IV SOSY
PREFILLED_SYRINGE | INTRAVENOUS | Status: AC
  Filled 2016-10-18: qty 10

## 2016-10-18 MED ORDER — ACETAMINOPHEN 650 MG RE SUPP: 650.0000 mg | RECTAL | Status: DC | PRN

## 2016-10-18 MED ORDER — LIDOCAINE 2% (20 MG/ML) 5 ML SYRINGE
INTRAMUSCULAR | Status: AC
  Filled 2016-10-18: qty 5

## 2016-10-18 MED ORDER — HYDROMORPHONE HCL 1 MG/ML IJ SOLN: 0.2500 mg | INTRAMUSCULAR | Status: DC | PRN

## 2016-10-18 MED ORDER — SODIUM CHLORIDE 0.9% FLUSH: 3.0000 mL | Freq: Two times a day (BID) | INTRAVENOUS | Status: DC

## 2016-10-18 MED ORDER — PROPOFOL 10 MG/ML IV BOLUS
INTRAVENOUS | Status: DC | PRN
  Administered 2016-10-18: 170 mg via INTRAVENOUS

## 2016-10-18 MED ORDER — OXYCODONE HCL 5 MG PO TABS
ORAL_TABLET | ORAL | Status: AC
Start: 2016-10-18 — End: 2016-10-19
  Filled 2016-10-18: qty 2

## 2016-10-18 MED ORDER — FENTANYL CITRATE (PF) 100 MCG/2ML IJ SOLN
INTRAMUSCULAR | Status: DC | PRN
  Administered 2016-10-18: 100 ug via INTRAVENOUS

## 2016-10-18 MED ORDER — THROMBIN 5000 UNITS EX SOLR
OROMUCOSAL | Status: DC | PRN
  Administered 2016-10-18: 5 mL via TOPICAL

## 2016-10-18 MED ORDER — SODIUM CHLORIDE 0.9 % IR SOLN
Status: DC | PRN
  Administered 2016-10-18: 500 mL

## 2016-10-18 MED ORDER — ONDANSETRON HCL 4 MG/2ML IJ SOLN: 4.0000 mg | Freq: Four times a day (QID) | INTRAMUSCULAR | Status: DC | PRN

## 2016-10-18 MED ORDER — HYDROMORPHONE HCL 1 MG/ML IJ SOLN: 1.0000 mg | INTRAMUSCULAR | Status: DC | PRN

## 2016-10-18 MED ORDER — TAMSULOSIN HCL 0.4 MG PO CAPS
0.4000 mg | ORAL_CAPSULE | Freq: Every evening | ORAL | Status: DC
  Administered 2016-10-18 – 2016-10-19 (×2): 0.4 mg via ORAL
  Filled 2016-10-18 (×2): qty 1

## 2016-10-18 MED ORDER — ALBUMIN HUMAN 5 % IV SOLN
INTRAVENOUS | Status: DC | PRN
  Administered 2016-10-18: 11:00:00 via INTRAVENOUS

## 2016-10-18 MED ORDER — SUGAMMADEX SODIUM 500 MG/5ML IV SOLN
INTRAVENOUS | Status: AC
  Filled 2016-10-18: qty 5

## 2016-10-18 MED ORDER — FENTANYL CITRATE (PF) 250 MCG/5ML IJ SOLN
INTRAMUSCULAR | Status: AC
  Filled 2016-10-18: qty 5

## 2016-10-18 MED ORDER — ROCURONIUM BROMIDE 10 MG/ML (PF) SYRINGE
PREFILLED_SYRINGE | INTRAVENOUS | Status: AC
  Filled 2016-10-18: qty 5

## 2016-10-18 MED ORDER — TOPIRAMATE 25 MG PO TABS
50.0000 mg | ORAL_TABLET | Freq: Two times a day (BID) | ORAL | Status: DC
  Administered 2016-10-18 – 2016-10-20 (×4): 50 mg via ORAL
  Filled 2016-10-18 (×4): qty 2

## 2016-10-18 MED ORDER — LACTATED RINGERS IV SOLN
INTRAVENOUS | Status: DC | PRN
  Administered 2016-10-18 (×2): via INTRAVENOUS

## 2016-10-18 MED ORDER — MENTHOL 3 MG MT LOZG: 1.0000 | LOZENGE | OROMUCOSAL | Status: DC | PRN

## 2016-10-18 MED ORDER — PANTOPRAZOLE SODIUM 40 MG PO TBEC
40.0000 mg | DELAYED_RELEASE_TABLET | Freq: Every day | ORAL | Status: DC
  Administered 2016-10-19 – 2016-10-20 (×2): 40 mg via ORAL
  Filled 2016-10-18 (×2): qty 1

## 2016-10-18 MED ORDER — HYDROMORPHONE HCL 1 MG/ML IJ SOLN
INTRAMUSCULAR | Status: AC
  Filled 2016-10-18: qty 1

## 2016-10-18 MED ORDER — ACETAMINOPHEN 325 MG PO TABS: 650.0000 mg | ORAL_TABLET | ORAL | Status: DC | PRN

## 2016-10-18 MED ORDER — BUPIVACAINE HCL (PF) 0.5 % IJ SOLN
INTRAMUSCULAR | Status: AC
  Filled 2016-10-18: qty 30

## 2016-10-18 MED ORDER — SUCCINYLCHOLINE CHLORIDE 20 MG/ML IJ SOLN
INTRAMUSCULAR | Status: DC | PRN
  Administered 2016-10-18: 100 mg via INTRAVENOUS

## 2016-10-18 MED ORDER — ONDANSETRON HCL 4 MG/2ML IJ SOLN
INTRAMUSCULAR | Status: AC
  Filled 2016-10-18: qty 2

## 2016-10-18 MED ORDER — SENNA 8.6 MG PO TABS
1.0000 | ORAL_TABLET | Freq: Two times a day (BID) | ORAL | Status: DC
  Administered 2016-10-18 – 2016-10-20 (×4): 8.6 mg via ORAL
  Filled 2016-10-18 (×4): qty 1

## 2016-10-18 MED ORDER — CELECOXIB 200 MG PO CAPS: 200.0000 mg | ORAL_CAPSULE | Freq: Two times a day (BID) | ORAL | Status: DC

## 2016-10-18 MED ORDER — POTASSIUM CHLORIDE IN NACL 20-0.9 MEQ/L-% IV SOLN
INTRAVENOUS | Status: DC
  Administered 2016-10-18: 21:00:00 via INTRAVENOUS
  Filled 2016-10-18 (×2): qty 1000

## 2016-10-18 MED ORDER — DEXAMETHASONE SODIUM PHOSPHATE 10 MG/ML IJ SOLN
10.0000 mg | INTRAMUSCULAR | Status: AC
  Administered 2016-10-18: 10 mg via INTRAVENOUS
  Filled 2016-10-18: qty 1

## 2016-10-18 MED ORDER — METHOCARBAMOL 500 MG PO TABS
500.0000 mg | ORAL_TABLET | Freq: Four times a day (QID) | ORAL | Status: DC | PRN
  Administered 2016-10-18 – 2016-10-19 (×4): 500 mg via ORAL
  Filled 2016-10-18 (×3): qty 1

## 2016-10-18 MED ORDER — OXYCODONE HCL 5 MG PO TABS
5.0000 mg | ORAL_TABLET | Freq: Once | ORAL | Status: AC | PRN
  Administered 2016-10-18: 5 mg via ORAL

## 2016-10-18 MED ORDER — INSULIN ASPART 100 UNIT/ML ~~LOC~~ SOLN
0.0000 [IU] | Freq: Three times a day (TID) | SUBCUTANEOUS | Status: DC
  Administered 2016-10-19 (×2): 2 [IU] via SUBCUTANEOUS
  Administered 2016-10-19: 3 [IU] via SUBCUTANEOUS
  Administered 2016-10-20: 2 [IU] via SUBCUTANEOUS

## 2016-10-18 MED ORDER — MIDAZOLAM HCL 2 MG/2ML IJ SOLN
INTRAMUSCULAR | Status: AC
  Filled 2016-10-18: qty 2

## 2016-10-18 MED ORDER — EPHEDRINE SULFATE 50 MG/ML IJ SOLN
INTRAMUSCULAR | Status: AC
  Filled 2016-10-18: qty 1

## 2016-10-18 MED ORDER — PHENOL 1.4 % MT LIQD: 1.0000 | OROMUCOSAL | Status: DC | PRN

## 2016-10-18 MED ORDER — THROMBIN 20000 UNITS EX SOLR
CUTANEOUS | Status: DC | PRN
  Administered 2016-10-18: 20 mL via TOPICAL

## 2016-10-18 MED ORDER — EPHEDRINE SULFATE-NACL 50-0.9 MG/10ML-% IV SOSY
PREFILLED_SYRINGE | INTRAVENOUS | Status: DC | PRN
  Administered 2016-10-18: 5 mg via INTRAVENOUS

## 2016-10-18 MED ORDER — VANCOMYCIN HCL 1000 MG IV SOLR
INTRAVENOUS | Status: DC | PRN
Start: 2016-10-18 — End: 2016-10-18
  Administered 2016-10-18: 1000 mg

## 2016-10-18 MED ORDER — ACETAMINOPHEN 10 MG/ML IV SOLN
1000.0000 mg | INTRAVENOUS | Status: AC
  Administered 2016-10-18: 1000 mg via INTRAVENOUS
  Filled 2016-10-18: qty 100

## 2016-10-18 MED ORDER — LOSARTAN POTASSIUM 50 MG PO TABS
25.0000 mg | ORAL_TABLET | Freq: Every day | ORAL | Status: DC
  Administered 2016-10-18 – 2016-10-20 (×2): 25 mg via ORAL
  Filled 2016-10-18 (×3): qty 1

## 2016-10-18 MED ORDER — METHOCARBAMOL 1000 MG/10ML IJ SOLN
500.0000 mg | Freq: Four times a day (QID) | INTRAVENOUS | Status: DC | PRN
  Filled 2016-10-18: qty 5

## 2016-10-18 MED ORDER — ALBUTEROL SULFATE (2.5 MG/3ML) 0.083% IN NEBU: 3.0000 mL | INHALATION_SOLUTION | RESPIRATORY_TRACT | Status: DC | PRN

## 2016-10-18 MED ORDER — ONDANSETRON HCL 4 MG/2ML IJ SOLN
INTRAMUSCULAR | Status: DC | PRN
  Administered 2016-10-18: 4 mg via INTRAVENOUS

## 2016-10-18 MED ORDER — DEXTROSE 5 % IV SOLN
3.0000 g | INTRAVENOUS | Status: DC
  Filled 2016-10-18: qty 3000

## 2016-10-18 MED ORDER — ONDANSETRON HCL 4 MG PO TABS: 4.0000 mg | ORAL_TABLET | Freq: Four times a day (QID) | ORAL | Status: DC | PRN

## 2016-10-18 MED ORDER — THROMBIN 20000 UNITS EX SOLR
CUTANEOUS | Status: AC
  Filled 2016-10-18: qty 20000

## 2016-10-18 MED ORDER — POTASSIUM CHLORIDE CRYS ER 20 MEQ PO TBCR
40.0000 meq | EXTENDED_RELEASE_TABLET | Freq: Two times a day (BID) | ORAL | Status: DC
  Administered 2016-10-18 – 2016-10-20 (×4): 40 meq via ORAL
  Filled 2016-10-18 (×4): qty 2

## 2016-10-18 MED ORDER — SODIUM CHLORIDE 0.9 % IV SOLN: 250.0000 mL | INTRAVENOUS | Status: DC

## 2016-10-18 MED ORDER — SODIUM CHLORIDE 0.9% FLUSH: 3.0000 mL | INTRAVENOUS | Status: DC | PRN

## 2016-10-18 MED ORDER — CEFAZOLIN SODIUM-DEXTROSE 2-4 GM/100ML-% IV SOLN
2.0000 g | Freq: Three times a day (TID) | INTRAVENOUS | Status: AC
  Administered 2016-10-18 – 2016-10-19 (×2): 2 g via INTRAVENOUS
  Filled 2016-10-18 (×2): qty 100

## 2016-10-18 MED ORDER — HYDROMORPHONE HCL 1 MG/ML IJ SOLN
0.2500 mg | INTRAMUSCULAR | Status: AC | PRN
  Administered 2016-10-18 (×4): 0.5 mg via INTRAVENOUS

## 2016-10-18 MED ORDER — PHENYLEPHRINE 40 MCG/ML (10ML) SYRINGE FOR IV PUSH (FOR BLOOD PRESSURE SUPPORT)
PREFILLED_SYRINGE | INTRAVENOUS | Status: AC
  Filled 2016-10-18: qty 10

## 2016-10-18 MED ORDER — ROCURONIUM BROMIDE 100 MG/10ML IV SOLN
INTRAVENOUS | Status: DC | PRN
  Administered 2016-10-18: 50 mg via INTRAVENOUS

## 2016-10-18 MED ORDER — PHENYLEPHRINE HCL 10 MG/ML IJ SOLN
INTRAVENOUS | Status: DC | PRN
  Administered 2016-10-18: 11:00:00 via INTRAVENOUS
  Administered 2016-10-18: 25 ug/min via INTRAVENOUS

## 2016-10-18 MED ORDER — LACTATED RINGERS IV SOLN
INTRAVENOUS | Status: DC | PRN
  Administered 2016-10-18 (×2): via INTRAVENOUS

## 2016-10-18 SURGICAL SUPPLY — 77 items
BAG DECANTER FOR FLEXI CONT (MISCELLANEOUS) ×2 IMPLANT
BASKET BONE COLLECTION (BASKET) ×2 IMPLANT
BENZOIN TINCTURE PRP APPL 2/3 (GAUZE/BANDAGES/DRESSINGS) ×2 IMPLANT
BIT DRILL PLIF MAS DISP 5.5MM (DRILL) ×1 IMPLANT
BLADE CLIPPER SURG (BLADE) IMPLANT
BLADE SURG 10 STRL SS (BLADE) ×2 IMPLANT
BONE CANC CHIPS 40CC CAN1/2 (Bone Implant) ×2 IMPLANT
BUR MATCHSTICK NEURO 3.0 LAGG (BURR) ×2 IMPLANT
CANISTER SUCT 3000ML PPV (MISCELLANEOUS) ×2 IMPLANT
CARTRIDGE OIL MAESTRO DRILL (MISCELLANEOUS) ×1 IMPLANT
CATH FOLEY LATEX FREE 16FR (CATHETERS) ×1
CATH FOLEY LF 16FR (CATHETERS) ×1 IMPLANT
CHIPS CANC BONE 40CC CAN1/2 (Bone Implant) ×1 IMPLANT
CONT SPEC 4OZ CLIKSEAL STRL BL (MISCELLANEOUS) ×2 IMPLANT
CORD BI POLAR (MISCELLANEOUS) ×2 IMPLANT
COVER BACK TABLE 60X90IN (DRAPES) ×2 IMPLANT
DERMABOND ADVANCED (GAUZE/BANDAGES/DRESSINGS) ×1
DERMABOND ADVANCED .7 DNX12 (GAUZE/BANDAGES/DRESSINGS) ×1 IMPLANT
DIFFUSER DRILL AIR PNEUMATIC (MISCELLANEOUS) ×2 IMPLANT
DRAPE C-ARM 42X72 X-RAY (DRAPES) ×2 IMPLANT
DRAPE LAPAROTOMY 100X72X124 (DRAPES) ×2 IMPLANT
DRAPE POUCH INSTRU U-SHP 10X18 (DRAPES) ×2 IMPLANT
DRAPE SURG 17X23 STRL (DRAPES) ×2 IMPLANT
DRILL PLIF MAS DISP 5.5MM (DRILL) ×2
DRSG OPSITE 4X5.5 SM (GAUZE/BANDAGES/DRESSINGS) ×2 IMPLANT
DRSG OPSITE POSTOP 4X10 (GAUZE/BANDAGES/DRESSINGS) ×2 IMPLANT
DURAPREP 26ML APPLICATOR (WOUND CARE) ×2 IMPLANT
ELECT CAUTERY BLADE 6.4 (BLADE) ×2 IMPLANT
ELECT REM PT RETURN 9FT ADLT (ELECTROSURGICAL) ×2
ELECTRODE REM PT RTRN 9FT ADLT (ELECTROSURGICAL) ×1 IMPLANT
EVACUATOR 1/8 PVC DRAIN (DRAIN) ×2 IMPLANT
GAUZE SPONGE 4X4 16PLY XRAY LF (GAUZE/BANDAGES/DRESSINGS) ×2 IMPLANT
GLOVE BIO SURGEON STRL SZ7 (GLOVE) IMPLANT
GLOVE BIO SURGEON STRL SZ8 (GLOVE) IMPLANT
GLOVE BIOGEL PI IND STRL 7.0 (GLOVE) ×1 IMPLANT
GLOVE BIOGEL PI IND STRL 7.5 (GLOVE) ×1 IMPLANT
GLOVE BIOGEL PI INDICATOR 7.0 (GLOVE) ×1
GLOVE BIOGEL PI INDICATOR 7.5 (GLOVE) ×1
GLOVE SURG SS PI 6.5 STRL IVOR (GLOVE) ×2 IMPLANT
GLOVE SURG SS PI 7.0 STRL IVOR (GLOVE) ×4 IMPLANT
GLOVE SURG SS PI 7.5 STRL IVOR (GLOVE) ×6 IMPLANT
GLOVE SURG SS PI 8.0 STRL IVOR (GLOVE) ×4 IMPLANT
GOWN STRL REUS W/ TWL LRG LVL3 (GOWN DISPOSABLE) ×2 IMPLANT
GOWN STRL REUS W/ TWL XL LVL3 (GOWN DISPOSABLE) ×2 IMPLANT
GOWN STRL REUS W/TWL 2XL LVL3 (GOWN DISPOSABLE) IMPLANT
GOWN STRL REUS W/TWL LRG LVL3 (GOWN DISPOSABLE) ×2
GOWN STRL REUS W/TWL XL LVL3 (GOWN DISPOSABLE) ×2
HEMOSTAT POWDER KIT SURGIFOAM (HEMOSTASIS) ×2 IMPLANT
KIT BASIN OR (CUSTOM PROCEDURE TRAY) ×2 IMPLANT
KIT ROOM TURNOVER OR (KITS) ×2 IMPLANT
MILL MEDIUM DISP (BLADE) ×2 IMPLANT
NEEDLE ASP BONE MRW 8GX15 (NEEDLE) ×2 IMPLANT
NEEDLE HYPO 25X1 1.5 SAFETY (NEEDLE) ×2 IMPLANT
NS IRRIG 1000ML POUR BTL (IV SOLUTION) ×2 IMPLANT
OIL CARTRIDGE MAESTRO DRILL (MISCELLANEOUS) ×2
PACK LAMINECTOMY NEURO (CUSTOM PROCEDURE TRAY) ×2 IMPLANT
PAD ARMBOARD 7.5X6 YLW CONV (MISCELLANEOUS) ×10 IMPLANT
SCREW LOCK (Screw) ×11 IMPLANT
SCREW LOCK FXNS SPNE MAS PL (Screw) ×11 IMPLANT
SCREW SHANK 5.5X40MM (Screw) ×2 IMPLANT
SCREW SHANKS 5.5X35 (Screw) ×10 IMPLANT
SCREW TULIP 5.5 (Screw) ×12 IMPLANT
SPONGE LAP 4X18 X RAY DECT (DISPOSABLE) IMPLANT
SPONGE SURGIFOAM ABS GEL 100 (HEMOSTASIS) ×2 IMPLANT
STEM HUMERAL SZ 3B LONG 98 (Rod) ×2 IMPLANT
STEM HUMERAL SZ 3B LONG 98MM (Rod) ×2 IMPLANT
STRIP BIOACTIVE 20CC 25X100X8 (Miscellaneous) ×2 IMPLANT
STRIP CLOSURE SKIN 1/2X4 (GAUZE/BANDAGES/DRESSINGS) ×2 IMPLANT
SUT VIC AB 0 CT1 18XCR BRD8 (SUTURE) ×3 IMPLANT
SUT VIC AB 0 CT1 8-18 (SUTURE) ×3
SUT VIC AB 2-0 CP2 18 (SUTURE) ×4 IMPLANT
SUT VIC AB 2-0 CT1 18 (SUTURE) IMPLANT
SUT VIC AB 3-0 SH 8-18 (SUTURE) ×6 IMPLANT
TOWEL GREEN STERILE (TOWEL DISPOSABLE) ×2 IMPLANT
TOWEL GREEN STERILE FF (TOWEL DISPOSABLE) ×2 IMPLANT
TRAY FOLEY BAG SILVER LF 16FR (SET/KITS/TRAYS/PACK) IMPLANT
WATER STERILE IRR 1000ML POUR (IV SOLUTION) ×2 IMPLANT

## 2016-10-18 NOTE — Progress Notes (Signed)
Patient ID: Carlos Dawson, male   DOB: [birth year], 76 y.o.   MRN: [id number] Looks and feels good post-op, pain controlled for the most part

## 2016-10-18 NOTE — Anesthesia Procedure Notes (Signed)
Procedure Name: Intubation Date/Time: 10/18/2016 7:51 AM Performed by: Jenne Campus Pre-anesthesia Checklist: Patient identified, Emergency Drugs available, Suction available and Patient being monitored Patient Re-evaluated:Patient Re-evaluated prior to induction Oxygen Delivery Method: Circle System Utilized Preoxygenation: Pre-oxygenation with 100% oxygen Induction Type: IV induction Ventilation: Mask ventilation without difficulty Grade View: Grade I Tube type: Oral Tube size: 7.5 mm Number of attempts: 1 Airway Equipment and Method: Stylet,  Oral airway and Video-laryngoscopy Placement Confirmation: ETT inserted through vocal cords under direct vision,  positive ETCO2 and breath sounds checked- equal and bilateral Secured at: 22 cm Tube secured with: Tape Dental Injury: Teeth and Oropharynx as per pre-operative assessment  Difficulty Due To: Difficulty was anticipated, Difficult Airway- due to large tongue, Difficult Airway- due to reduced neck mobility and Difficult Airway- due to anterior larynx

## 2016-10-18 NOTE — Anesthesia Procedure Notes (Signed)
Arterial Line Insertion Start/End9/26/2018 7:05 AM, 10/18/2016 7:10 AM Performed by: Luciana Axe K  Patient location: Pre-op. Preanesthetic checklist: patient identified, IV checked, site marked, risks and benefits discussed, surgical consent, monitors and equipment checked, pre-op evaluation and timeout performed Lidocaine 1% used for infiltration Left, radial was placed Catheter size: 20 G Hand hygiene performed  and maximum sterile barriers used   Attempts: 1 Following insertion, dressing applied and Biopatch. Patient tolerated the procedure well with no immediate complications.

## 2016-10-18 NOTE — Transfer of Care (Signed)
Immediate Anesthesia Transfer of Care Note  Patient: Carlos Dawson  Procedure(s) Performed: Procedure(s): Posterior Lateral Fusion - Lumbar One-Four, segmental instrumentation Lumbar One-Five,  decompression, (N/A)  Patient Location: PACU  Anesthesia Type:General  Level of Consciousness: awake, oriented and patient cooperative  Airway & Oxygen Therapy: Patient Spontanous Breathing and Patient connected to face mask oxygen  Post-op Assessment: Report given to RN and Post -op Vital signs reviewed and stable  Post vital signs: Reviewed  Last Vitals:  Vitals:   10/18/16 0611 10/18/16 1231  BP: (!) 120/59   Pulse: 62 75  Resp: 20 18  Temp: 37 C 37 C  SpO2: 96% 99%    Last Pain:  Vitals:   10/18/16 0653  TempSrc:   PainSc: 5       Patients Stated Pain Goal: 3 ([id number])  Complications: No apparent anesthesia complications

## 2016-10-18 NOTE — Op Note (Addendum)
10/18/2016  12:34 PM  PATIENT:  Carlos Dawson  76 y.o. male  PRE-OPERATIVE DIAGNOSIS:  Recurrent spinal stenosis L2-3, severe lumbar spondylosis L1-L4, previous lumbar fusion L4-5, back and leg pain  POST-OPERATIVE DIAGNOSIS:  same  PROCEDURE:   1. Redo Decompressive lumbar laminectomy and hemi-facetectomy L2-3 in order to adequately decompress the neural elements and address the spinal stenosis 2. Posterior fixation L1-L5 using nuvasive cortical pedicle screws.  3. Intertransverse arthrodesis L1-L5 using morcellized autograft and allograft soaked with bone marrow aspirate obtained from a separate fascial incision over the right iliac crest.  SURGEON:  Sherley Bounds, MD  ASSISTANTS: Meyran FNP  ANESTHESIA:  General  EBL: 400 ml  Total I/O In: 3250 [I.V.:3000; IV Piggyback:250] Out: 600 [Urine:200; Blood:400]  BLOOD ADMINISTERED:none  DRAINS: Medium Hemovac   INDICATION FOR PROCEDURE: This patient presented with back and leg pain with difficulty with gait. Imaging revealed recurrent spinal stenosis L2-3 with significant spondylosis L1-L4 above previous L4-5 fusion. The patient tried a reasonable attempt at conservative medical measures without relief. I recommended decompression and instrumented fusion to address the stenosis and spondylosis. Patient understood the risks, benefits, and alternatives and potential outcomes and wished to proceed.  PROCEDURE DETAILS:  The patient was brought to the operating room. After induction of generalized endotracheal anesthesia the patient was rolled into the prone position on chest rolls and all pressure points were padded. The patient's lumbar region was cleaned and then prepped with DuraPrep and draped in the usual sterile fashion. Anesthesia was injected and then a dorsal midline incision was made and carried down to the lumbosacral fascia. The fascia was opened and the paraspinous musculature was taken down in a subperiosteal fashion to  expose L1-L5 as well as the previously placed hardware at L4-5. The locking caps were removed from the old tulip heads and the rods were removed. Scar tissue was removed from around the L4-5 pedicle screws which still had good purchase. A self-retaining retractor was placed. Intraoperative fluoroscopy confirmed my level, and I started with placement of the L1-L3 cortical pedicle screws. The pedicle screw entry zones were identified utilizing surface landmarks and  AP and lateral fluoroscopy. I scored the cortex with the high-speed drill and then used the hand drill to drill an upward and outward direction into the pedicle. I then tapped line to line. I then placed a 5.5 x 35 mm cortical pedicle screw into the pedicles of L1-L3 inclusive bilaterally. I then turned my attention to the decompression and redo complete lumbar laminectomies, hemi- facetectomies, and foraminotomies were performed at L2-3. Great care was taken because of the [facility] he had suffered a CSF leak. He had recurrent stenosis at this level. There was significant thecal sac compression bilaterally.  Much more generous decompression and generous foraminotomy was undertaken in order to adequately decompress the neural elements and address the patient's leg pain. The yellow ligament was removed to expose the underlying dura and nerve roots, and generous foraminotomies were performed to adequately decompress the neural elements. Both the exiting and traversing nerve roots were decompressed on both sides until a coronary dilator passed easily along the nerve roots.  I dissected in a supra-fascial plane to identify the R iliac crest, opened the fascia and extracted 20 cc of BM aspirate from the crest and soaked the allograft, I then dried the hole with surgifoam and closd the fascia with 0 vicryl. We then decorticated the transverse processes from  L1-L5 bilaterally and laid a mixture  of morcellized autograft and allograft out over these to perform intertransverse arthrodesis at L1-L5. We then placed lordotic rods into the multiaxial screw heads of the pedicle screws from L1-L5 and locked these in position with the locking caps and anti-torque device. We then checked our construct with AP and lateral fluoroscopy. Irrigated with copious amounts of bacitracin-containing saline solution. Inspected the nerve roots once again to assure adequate decompression, lined to the dura with Gelfoam, placed powdered vancomycin into the wound, and closed the muscle and the fascia with 0 Vicryl. Closed the subcutaneous tissues with 2-0 Vicryl and subcuticular tissues with 3-0 Vicryl. The skin was closed with benzoin and Steri-Strips. Dressing was then applied, the patient was awakened from general anesthesia and transported to the recovery room in stable condition. At the end of the procedure all sponge, needle and instrument counts were correct.   PLAN OF CARE: admit to inpatient  PATIENT DISPOSITION:  PACU - hemodynamically stable.   Delay start of Pharmacological VTE agent (>24hrs) due to surgical blood loss or risk of bleeding:  yes

## 2016-10-18 NOTE — Progress Notes (Signed)
Pt received at this from PACU alert, verbal with no noted distress. Surgical dressing site dry and intact. Hemovac in place. Pt oriented to room. Safety measures in place. Call bell within reach. Will continue to monitor.

## 2016-10-18 NOTE — Progress Notes (Signed)
Pt set up with CPAP for the night.  Pt has a mask from home. No complications noted at time of setting up. Pt stated that he uses a pressure of 10 at home.  Settings to match pt home settings.

## 2016-10-18 NOTE — Anesthesia Preprocedure Evaluation (Addendum)
Anesthesia Evaluation  Patient identified by MRN, date of birth, ID band Patient awake    Reviewed: Allergy & Precautions, NPO status , Patient's Chart, lab work & pertinent test results  Airway Mallampati: III  TM Distance: <3 FB Neck ROM: Full    Dental no notable dental hx. (+) Caps, Dental Advisory Given, Teeth Intact   Pulmonary asthma , sleep apnea and Continuous Positive Airway Pressure Ventilation , former smoker,    breath sounds clear to auscultation       Cardiovascular hypertension, Pt. on medications and Pt. on home beta blockers + CAD, + Peripheral Vascular Disease and +CHF  + dysrhythmias + Valvular Problems/Murmurs  Rhythm:Regular Rate:Normal  Known aortic aneurysm   Neuro/Psych    GI/Hepatic GERD  Medicated and Controlled,  Endo/Other  diabetes, Type 2, Oral Hypoglycemic AgentsMorbid obesity  Renal/GU      Musculoskeletal   Abdominal (+) + obese,   Peds  Hematology   Anesthesia Other Findings   Reproductive/Obstetrics                           Anesthesia Physical Anesthesia Plan  ASA: III  Anesthesia Plan: General   Post-op Pain Management:    Induction: Intravenous  PONV Risk Score and Plan: 3 and Ondansetron, Dexamethasone, Midazolam, Propofol infusion and Treatment may vary due to age or medical condition  Airway Management Planned: Oral ETT and Video Laryngoscope Planned  Additional Equipment: Arterial line  Intra-op Plan:   Post-operative Plan: Extubation in OR  Informed Consent:   Plan Discussed with:   Anesthesia Plan Comments:         Anesthesia Quick Evaluation

## 2016-10-18 NOTE — Anesthesia Postprocedure Evaluation (Signed)
Anesthesia Post Note  Patient: Carlos Dawson  Procedure(s) Performed: Procedure(s) (LRB): Posterior Lateral Fusion - Lumbar One-Four, segmental instrumentation Lumbar One-Five,  decompression, (N/A)     Patient location during evaluation: PACU Anesthesia Type: General Level of consciousness: awake and sedated Pain management: pain level controlled Vital Signs Assessment: post-procedure vital signs reviewed and stable Respiratory status: spontaneous breathing, nonlabored ventilation, respiratory function stable and patient connected to nasal cannula oxygen Cardiovascular status: blood pressure returned to baseline and stable Postop Assessment: no apparent nausea or vomiting Anesthetic complications: no    Last Vitals:  Vitals:   10/18/16 1355 10/18/16 1420  BP: 112/74 120/83  Pulse: 75 70  Resp: 17 13  Temp:    SpO2: 93% 99%    Last Pain:  Vitals:   10/18/16 1420  TempSrc:   PainSc: Asleep                 Juell Radney,JAMES TERRILL

## 2016-10-18 NOTE — H&P (Signed)
Subjective: Patient is a 76 y.o. male admitted for back pain and leg weakness. Onset of symptoms was several months ago, unchanged since that time.  The pain is rated moderate, and is located at the across the lower back and radiates to legs. The pain is described as aching and occurs intermittently. The symptoms have been progressive. Symptoms are exacerbated by exercise. MRI or CT showed stenosis and multilevel spondylosis, previous surgeries   Past Medical History:  Diagnosis Date  . Anticoagulant long-term use   . Aortic root enlargement ([place])   . Ascending aortic aneurysm [facility])    recent scan in October 2012 showing no change; followed by Dr. Servando Snare  . ASCVD (arteriosclerotic cardiovascular disease)    Prior BMS to the 2nd OM in September 2012; with repeat cath in October showing patency  . CAD (coronary artery disease)    a. s/p BMS to 2nd OM in Sept 2012; b. LexiScan Myoview (12/2012):  Inf infarct; bowel and motion artifact make study difficult to interpret; no ischemia; not gated; Low Risk  . CHF (congestive heart failure) ([place])    no recent issues 10/13/14  . Colonic polyp   . Contact lens/glasses fitting   . Diabetes mellitus without complication (HCC)    metphormin, average 154 dx 2017  . Diastolic dysfunction   . Generalized headaches    neck stenosis  . GERD (gastroesophageal reflux disease)   . Hearing loss   . Hearing loss    more so on left  . Hemorrhoids   . Hypertension   . IBS (irritable bowel syndrome)   . LVH (left ventricular hypertrophy)   . Mild intermittent asthma   . OA (osteoarthritis)   . Obesities, morbid ([place])   . OSA (obstructive sleep apnea)    PSG 03/30/97 AHI 21, BPAP 13/9  . PAF (paroxysmal atrial fibrillation) ([place])    a. on Xarelto b. s/p DCCV in 08/2016  . Pneumonia   . Prostate CA [facility])    Oncologist  DR. Daral[facility] 09/24/14, undetermined tx   prostate  . Pulmonary embolism ([place])    2008  . Sleep apnea   . SOB (shortness of  breath)    on excertion  . Thoracic aortic aneurysm (HCC)    Aortic Size Index=     5.0    /Body surface area is 2.43 meters squared. = 2.05  < 2.75 cm/m2      4% risk per year 2.75 to 4.25          8% risk per year > 4.25 cm/m2    20% risk per year   Stable aneurysmal dilation of the ascending aorta with maximum AP diameter of 4.8 cm. Stable area of narrowing of the proximal most portion of the descending aorta measuring 2 cm., previously identified as an area of coarctation. No evidence of aortic dissection.  Coronary artery disease.  Normal appearance of the lungs.   Electronically Signed   By: Fidela Salisbury M.D.   On: 10/01/2014 08:50      Past Surgical History:  Procedure Laterality Date  . ACHILLES TENDON REPAIR    . APPENDECTOMY    . CARDIAC CATHETERIZATION  2006  . CARDIAC CATHETERIZATION  October 2012   Stent patent  . CARPAL TUNNEL RELEASE     LEFT  . CERVICAL SPINE SURGERY  06/02/2010   lower back and neck  . CHOLECYSTECTOMY    . COLONOSCOPY WITH PROPOFOL N/A 12/29/2014   Procedure: COLONOSCOPY WITH PROPOFOL;  Surgeon: Garlan Fair, MD;  Location: [facility];  Service: Endoscopy;  Laterality: N/A;  . CORONARY STENT PLACEMENT  Sept 2012   2nd OM with BMS  . EYE SURGERY     bilateral cataract  . HEMORROIDECTOMY    . LAMINECTOMY  05/30/2012   L 4 L5  . LAPAROSCOPIC GASTRIC BANDING    . LEFT AND RIGHT HEART CATHETERIZATION WITH CORONARY ANGIOGRAM N/A 05/07/2014   Procedure: LEFT AND RIGHT HEART CATHETERIZATION WITH CORONARY ANGIOGRAM;  Surgeon: [name] Martinique, MD;  Location: [facility] CATH LAB;  Service: Cardiovascular;  Laterality: N/A;  . LUMBAR LAMINECTOMY/DECOMPRESSION MICRODISCECTOMY N/A 05/04/2016   Procedure: Laminectomy and Foraminotomy - Thoracic twelve-Lumbar one -Posterior Fusion Lumbar one-two;  Surgeon: Eustace Moore, MD;  Location: [facility];  Service: Neurosurgery;  Laterality: N/A;  . ROTATOR CUFF REPAIR     both  . TONSILLECTOMY    . TRIGGER FINGER RELEASE      LEFT  . UVULOPALATOPHARYNGOPLASTY    . VASECTOMY      Prior to Admission medications   Medication Sig Start Date End Date Taking? Authorizing Provider  acetaminophen (TYLENOL) 500 MG tablet Take 1,000 mg by mouth every 8 (eight) hours as needed for mild pain.    Yes [provider]  albuterol (PROVENTIL HFA;VENTOLIN HFA) 108 (90 Base) MCG/ACT inhaler Inhale 2 puffs into the lungs every 4 (four) hours as needed for wheezing or shortness of breath.   Yes [provider]  atorvastatin (LIPITOR) 10 MG tablet Take 10 mg by mouth daily at 6 PM.    Yes [provider]  budesonide-formoterol (SYMBICORT) 160-4.5 MCG/ACT inhaler Inhale 2 puffs into the lungs 2 (two) times daily as needed (for shortness of breath).    Yes [provider]  Cholecalciferol (VITAMIN D-3) 5000 units TABS Take 5,000 Units by mouth daily.   Yes [provider]  losartan (COZAAR) 25 MG tablet Take 25 mg by mouth daily.   Yes [provider]  metFORMIN (GLUCOPHAGE-XR) 500 MG 24 hr tablet Take 500 mg by mouth daily before supper.   Yes [provider]  metoprolol tartrate (LOPRESSOR) 25 MG tablet Take 1 tablet (25 mg total) by mouth 2 (two) times daily. START WITH 1/2 TAB X3DAYS THEN WHOLE TAB,MAY TAKE EXTRA 1/2 TAB IF SBP>110 10/13/16 01/11/17 Yes Barrett, Rhonda G, PA-C  montelukast (SINGULAIR) 10 MG tablet Take 10 mg by mouth at bedtime.   Yes [provider]  nitroGLYCERIN (NITROSTAT) 0.4 MG SL tablet Place 1 tablet (0.4 mg total) under the tongue every 5 (five) minutes as needed for chest pain. 09/15/16  Yes Carlota Raspberry, Tiffany, PA-C  pantoprazole (PROTONIX) 40 MG tablet Take 1 tablet (40 mg total) by mouth daily. 09/16/16  Yes Carlota Raspberry, Tiffany, PA-C  Polyethyl Glycol-Propyl Glycol (SYSTANE OP) Place 1 drop into both eyes as needed (for dry eyes).   Yes [provider]  potassium chloride SA (KLOR-CON M20) 20 MEQ tablet Take 2 tablets (40 mEq total) by mouth  2 (two) times daily. 03/22/16  Yes Martinique, [name], MD  psyllium (METAMUCIL) 58.6 % packet Take 1 packet by mouth daily.   Yes [provider]  rivaroxaban (XARELTO) 20 MG TABS tablet Take 20 mg by mouth daily with supper.    Yes [provider]  Skin Protectants, Misc. (EUCERIN) cream Apply 1 application topically as needed for dry skin.   Yes [provider]  tamsulosin (FLOMAX) 0.4 MG CAPS Take 0.4 mg by mouth every evening.  Yes [provider]  topiramate (TOPAMAX) 25 MG capsule Take 50 mg by mouth 2 (two) times daily.    Yes [provider]  traMADol (ULTRAM) 50 MG tablet Take 50 mg by mouth every 12 (twelve) hours as needed for moderate pain.  09/22/14  Yes [provider]  celecoxib (CELEBREX) 200 MG capsule Take 200 mg by mouth 2 (two) times daily.    [provider]  fluticasone (FLONASE) 50 MCG/ACT nasal spray Place 2 sprays into both nostrils daily as needed for allergies or rhinitis.    [provider]  methocarbamol (ROBAXIN) 500 MG tablet Take 1 tablet (500 mg total) by mouth 2 (two) times daily as needed for muscle spasms. 03/15/16   Noemi Chapel, MD  sildenafil (VIAGRA) 100 MG tablet Take 100 mg by mouth as needed for erectile dysfunction.  05/23/10   [provider]   Allergies  Allergen Reactions  . Adhesive [Tape] Itching and Rash  . Latex Itching, Rash and Other (See Comments)    Bandaids  . Morphine Itching    Social History  Substance Use Topics  . Smoking status: Former Smoker    Packs/day: 1.50    Years: 30.00    Quit date: 01/24/1992  . Smokeless tobacco: Never Used     Comment: Stopped in 1983  . Alcohol use No    Family History  Problem Relation Age of Onset  . Heart disease Mother   . Diabetes Mother   . Other Mother        stent placement  . Emphysema Father 22  . Heart attack Sister      Review of Systems  Positive ROS: neg  All other systems have been reviewed and  were otherwise negative with the exception of those mentioned in the HPI and as above.  Objective: Vital signs in last 24 hours: Temp:  [98.6 F (37 C)] 98.6 F (37 C) (09/26 1610) Pulse Rate:  [62] 62 (09/26 0611) Resp:  [20] 20 (09/26 0611) BP: (120)/(59) 120/59 (09/26 0611) SpO2:  [96 %] 96 % (09/26 0611) Weight:  [122.8 kg (270 lb 11.6 oz)] 122.8 kg (270 lb 11.6 oz) (09/26 0653)  General Appearance: Alert, cooperative, no distress, appears stated age Head: Normocephalic, without obvious abnormality, atraumatic Eyes: PERRL, conjunctiva/corneas clear, EOM's intact    Neck: Supple, symmetrical, trachea midline Back: Symmetric, no curvature, ROM normal, no CVA tenderness Lungs:  respirations unlabored Heart: Regular rate and rhythm Abdomen: Soft, non-tender Extremities: Extremities normal, atraumatic, no cyanosis or edema Pulses: 2+ and symmetric all extremities Skin: Skin color, texture, turgor normal, no rashes or lesions  NEUROLOGIC:   Mental status: Alert and oriented x4,  no aphasia, good attention span, fund of knowledge, and memory Motor Exam - grossly normal Sensory Exam - grossly normal Reflexes: trace Coordination - grossly normal Gait - grossly normal Balance - grossly normal Cranial Nerves: I: smell Not tested  II: visual acuity  OS: nl    OD: nl  II: visual fields Full to confrontation  II: pupils Equal, round, reactive to light  III,VII: ptosis None  III,IV,VI: extraocular muscles  Full ROM  V: mastication Normal  V: facial light touch sensation  Normal  V,VII: corneal reflex  Present  VII: facial muscle function - upper  Normal  VII: facial muscle function - lower Normal  VIII: hearing Not tested  IX: soft palate elevation  Normal  IX,X: gag reflex Present  XI: trapezius strength  5/5  XI: sternocleidomastoid  strength 5/5  XI: neck flexion strength  5/5  XII: tongue strength  Normal    Data Review Lab Results  Component Value Date   WBC 7.5  10/16/2016   HGB 12.3 (L) 10/16/2016   HCT 38.6 (L) 10/16/2016   MCV 87.3 10/16/2016   PLT 162 10/16/2016   Lab Results  Component Value Date   NA 141 10/16/2016   K 3.6 10/16/2016   CL 109 10/16/2016   CO2 23 10/16/2016   BUN 18 10/16/2016   CREATININE 1.19 10/16/2016   GLUCOSE 152 (H) 10/16/2016   Lab Results  Component Value Date   INR 1.16 10/16/2016    Assessment/Plan: Patient admitted for L1-5 fusion. Patient has failed a reasonable attempt at conservative therapy.  I explained the condition and procedure to the patient and answered any questions.  Patient wishes to proceed with procedure as planned. Understands risks/ benefits and typical outcomes of procedure.   Jadie Comas S 10/18/2016 7:34 AM

## 2016-10-19 ENCOUNTER — Encounter (HOSPITAL_COMMUNITY): Payer: Self-pay | Admitting: General Practice

## 2016-10-19 LAB — GLUCOSE, CAPILLARY
Glucose-Capillary: 171 mg/dL — ABNORMAL HIGH (ref 65–99)
Glucose-Capillary: 147 mg/dL — ABNORMAL HIGH (ref 65–99)
Glucose-Capillary: 154 mg/dL — ABNORMAL HIGH (ref 65–99)
Glucose-Capillary: 162 mg/dL — ABNORMAL HIGH (ref 65–99)

## 2016-10-19 NOTE — Evaluation (Signed)
Physical Therapy Evaluation Patient Details Name: Carlos Dawson MRN: [id number] DOB: July 28, 1940 Today's Date: 10/19/2016   History of Present Illness  Pt is a 76 y/o male s/p PLF L1-L4 with segmental fixation at L1-L5 and decompression. PMH includes, but not limited to ascending aortic aneurysm, CAD, CHF, HTN, DM, thoracic aortic aneurysm. H/o previous back surgery in April 2018.  Clinical Impression  Pt presented sitting OOB in recliner, awake and willing to participate in therapy session. Prior to admission, pt reported that he was independent with all functional mobility and ADLs. Pt lives with his spouse and will have 24/7 supervision/assistance upon d/c. Pt ambulated in the hallway with min guard to supervision for safety with use of RW. PT reviewed 3/3 back precautions with pt throughout session. PT will continue to follow acutely for mobility progression and plan for stair training next session.     Follow Up Recommendations No PT follow up;Supervision - Intermittent    Equipment Recommendations  None recommended by PT    Recommendations for Other Services       Precautions / Restrictions Precautions Precautions: Back Precaution Booklet Issued: No (OT provided) Precaution Comments: PT reviewed 3/3 back precautions with pt Required Braces or Orthoses: Spinal Brace Spinal Brace: Lumbar corset;Applied in sitting position Restrictions Weight Bearing Restrictions: No      Mobility  Bed Mobility               General bed mobility comments: pt OOB in recliner chair upon arrival  Transfers Overall transfer level: Needs assistance Equipment used: Rolling walker (2 wheeled) Transfers: Sit to/from Stand Sit to Stand: Min guard         General transfer comment: increased time, good technique, min guard for safety  Ambulation/Gait Ambulation/Gait assistance: Min guard;Supervision Ambulation Distance (Feet): 200 Feet (200' x2 with standing rest break) Assistive  device: Rolling walker (2 wheeled) Gait Pattern/deviations: Step-through pattern;Decreased step length - right;Decreased step length - left Gait velocity: decreased Gait velocity interpretation: Below normal speed for age/gender General Gait Details: cautious, steady gait pattern. Pt required one standing rest break secondary to fatigue  Stairs            Wheelchair Mobility    Modified Rankin (Stroke Patients Only)       Balance Overall balance assessment: Needs assistance Sitting-balance support: Feet supported Sitting balance-Leahy Scale: Good     Standing balance support: During functional activity;Bilateral upper extremity supported Standing balance-Leahy Scale: Poor Standing balance comment: reliant on UEs on RW                             Pertinent Vitals/Pain Pain Assessment: 0-10 Pain Score: 3  Faces Pain Scale: Hurts little more Pain Location: back Pain Descriptors / Indicators: Aching;Sore Pain Intervention(s): Monitored during session;Repositioned    Home Living Family/patient expects to be discharged to:: Private residence Living Arrangements: Spouse/significant other Available Help at Discharge: Family;Available 24 hours/day Type of Home: House Home Access: Stairs to enter Entrance Stairs-Rails: Can reach both Entrance Stairs-Number of Steps: 2 Home Layout: Multi-level;Other (Comment) (elevator) Home Equipment: Bedside commode;Walker - 2 wheels;Cane - single point      Prior Function Level of Independence: Independent         Comments: difficulty with LB ADLs, has reacher     Hand Dominance   Dominant Hand: Right    Extremity/Trunk Assessment   Upper Extremity Assessment Upper Extremity Assessment: Overall WFL for tasks assessed  Lower Extremity Assessment Lower Extremity Assessment: Overall WFL for tasks assessed    Cervical / Trunk Assessment Cervical / Trunk Assessment: Other exceptions Cervical / Trunk  Exceptions: s/p lumbar sx and hx of multiple back sxs  Communication   Communication: No difficulties  Cognition Arousal/Alertness: Awake/alert Behavior During Therapy: WFL for tasks assessed/performed Overall Cognitive Status: Within Functional Limits for tasks assessed                                        General Comments      Exercises     Assessment/Plan    PT Assessment Patient needs continued PT services  PT Problem List Decreased balance;Decreased mobility;Decreased coordination;Decreased knowledge of use of DME;Decreased safety awareness;Decreased knowledge of precautions;Pain       PT Treatment Interventions DME instruction;Gait training;Stair training;Functional mobility training;Therapeutic activities;Balance training;Therapeutic exercise;Neuromuscular re-education;Patient/family education    PT Goals (Current goals can be found in the Care Plan section)  Acute Rehab PT Goals Patient Stated Goal: return home, decrease pain PT Goal Formulation: With patient Time For Goal Achievement: 11/02/16 Potential to Achieve Goals: Good    Frequency Min 5X/week   Barriers to discharge        Co-evaluation               AM-PAC PT "6 Clicks" Daily Activity  Outcome Measure Difficulty turning over in bed (including adjusting bedclothes, sheets and blankets)?: A Little Difficulty moving from lying on back to sitting on the side of the bed? : A Little Difficulty sitting down on and standing up from a chair with arms (e.g., wheelchair, bedside commode, etc,.)?: A Little Help needed moving to and from a bed to chair (including a wheelchair)?: A Little Help needed walking in hospital room?: A Little Help needed climbing 3-5 steps with a railing? : A Little 6 Click Score: 18    End of Session Equipment Utilized During Treatment: Gait belt;Back brace Activity Tolerance: Patient tolerated treatment well Patient left: in chair;with call bell/phone within  reach;with chair alarm set Nurse Communication: Mobility status PT Visit Diagnosis: Other abnormalities of gait and mobility (R26.89);Pain Pain - part of body:  (back)    Time: 1007-1219 PT Time Calculation (min) (ACUTE ONLY): 12 min   Charges:   PT Evaluation $PT Eval Moderate Complexity: 1 Mod     PT G Codes:        [place], PT, DPT [place] 10/19/2016, 11:04 AM

## 2016-10-19 NOTE — Progress Notes (Signed)
Patient ID: TRIGGER FRASIER, male   DOB: 1940-06-30, 76 y.o.   MRN: [id number] Subjective: Patient reports mild back soreness, no leg pain or NTW, walking better  Objective: Vital signs in last 24 hours: Temp:  [97.6 F (36.4 C)-98.4 F (36.9 C)] 97.6 F (36.4 C) (09/27 0441) Pulse Rate:  [34-155] 65 (09/27 0900) Resp:  [8-22] 16 (09/27 0900) BP: (106-138)/(54-84) 110/65 (09/27 0900) SpO2:  [90 %-99 %] 98 % (09/27 0441) Arterial Line BP: (115-133)/(49-56) 115/53 (09/26 1325) Weight:  [129.1 kg (284 lb 11.2 oz)] 129.1 kg (284 lb 11.2 oz) (09/26 1821)  Intake/Output from previous day: 09/26 0701 - 09/27 0700 In: 4050 [I.V.:3000; IV Piggyback:250] Out: 1420 [Urine:500; Drains:520; Blood:400] Intake/Output this shift: Total I/O In: 240 [P.O.:240] Out: -   Neurologic: Grossly normal  Lab Results: Lab Results  Component Value Date   WBC 7.5 10/16/2016   HGB 12.3 (L) 10/16/2016   HCT 38.6 (L) 10/16/2016   MCV 87.3 10/16/2016   PLT 162 10/16/2016   Lab Results  Component Value Date   INR 1.16 10/16/2016   BMET Lab Results  Component Value Date   NA 141 10/16/2016   K 3.6 10/16/2016   CL 109 10/16/2016   CO2 23 10/16/2016   GLUCOSE 152 (H) 10/16/2016   BUN 18 10/16/2016   CREATININE 1.19 10/16/2016   CALCIUM 8.8 (L) 10/16/2016    Studies/Results: Dg Lumbar Spine 2-3 Views  Result Date: 10/18/2016 CLINICAL DATA:  Status post surgical posterior fusion of lumbar spine. EXAM: DG C-ARM 61-120 MIN; LUMBAR SPINE - 2-3 VIEW FLUOROSCOPY TIME:  1 minutes 28 seconds. COMPARISON:  CT scan of August 22, 2016. FINDINGS: Three intraoperative fluoroscopic images were obtained of the lower lumbar spine. These demonstrate the patient be status post surgical posterior fusion of L1, L2, L3, L4 and L5 with bilateral intrapedicular screw placement interbody fusion is noted at L4-5. Good alignment of vertebral bodies is noted. IMPRESSION: Status post surgical posterior fusion as described  above. Electronically Signed   By: Marijo Conception, M.D.   On: 10/18/2016 11:58   Dg C-arm 1-60 Min  Result Date: 10/18/2016 CLINICAL DATA:  Status post surgical posterior fusion of lumbar spine. EXAM: DG C-ARM 61-120 MIN; LUMBAR SPINE - 2-3 VIEW FLUOROSCOPY TIME:  1 minutes 28 seconds. COMPARISON:  CT scan of August 22, 2016. FINDINGS: Three intraoperative fluoroscopic images were obtained of the lower lumbar spine. These demonstrate the patient be status post surgical posterior fusion of L1, L2, L3, L4 and L5 with bilateral intrapedicular screw placement interbody fusion is noted at L4-5. Good alignment of vertebral bodies is noted. IMPRESSION: Status post surgical posterior fusion as described above. Electronically Signed   By: Marijo Conception, M.D.   On: 10/18/2016 11:58    Assessment/Plan: Doing great, likely home tomorrow   LOS: 1 day    Lee Kalt S 10/19/2016, 12:43 PM

## 2016-10-19 NOTE — Evaluation (Signed)
Occupational Therapy Evaluation Patient Details Name: Carlos Dawson MRN: [id number] DOB: [birth year] Today's Date: 10/19/2016    History of Present Illness Pt is s/p Laminectomy with posterior arthodesis level 3. PMH includes, but not limited to ascending aortic aneurysm, CAD, CHF, HTN, DM, thoracic aortic aneurysm. H/o previous back surgery in April 2018.   Clinical Impression   Pt admitted with the above diagnoses and presents with below problem list. Pt will benefit from continued acute OT to address the below listed deficits and maximize independence with basic ADLs prior to d/c home with family. PTA pt was mod I to independent with ADLs. Pt is currently min guard for functional mobility and transfers, min A for LB ADLs and bed mobility. Reviewed ADL education and back precautions.       Follow Up Recommendations  No OT follow up    Equipment Recommendations  None recommended by OT    Recommendations for Other Services PT consult     Precautions / Restrictions Precautions Precautions: Back Precaution Booklet Issued: Yes (comment) Restrictions Weight Bearing Restrictions: No      Mobility Bed Mobility               General bed mobility comments: up in chair on OT arrival. reports needing some help to power up sidelying>EOB. Pt reports he has adjustable bed at home  Transfers Overall transfer level: Needs assistance Equipment used: Rolling walker (2 wheeled) Transfers: Sit to/from Stand Sit to Stand: Min guard         General transfer comment: from recliner and comfort height toilet.     Balance Overall balance assessment: Needs assistance         Standing balance support: Bilateral upper extremity supported Standing balance-Leahy Scale: Fair                             ADL either performed or assessed with clinical judgement   ADL Overall ADL's : Needs assistance/impaired Eating/Feeding: Set up;Sitting   Grooming: Set up;Sitting    Upper Body Bathing: Set up;Sitting   Lower Body Bathing: Minimal assistance;Sit to/from stand   Upper Body Dressing : Set up;Sitting   Lower Body Dressing: Minimal assistance;Sit to/from stand   Toilet Transfer: Min guard;Ambulation;Comfort height toilet;RW;Grab bars Toilet Transfer Details (indicate cue type and reason): cues for hand placement.  Toileting- Clothing Manipulation and Hygiene: Set up;Minimal assistance;Sit to/from stand   Tub/ Shower Transfer: Min guard;Ambulation;3 in 1;Rolling walker   Functional mobility during ADLs: Min guard;Rolling walker General ADL Comments: Pt up in recliner on OT arrival. Completed in-room functional mobility and toilet transfer. Reviewed precautions and ADL education     Vision         Perception     Praxis      Pertinent Vitals/Pain Pain Assessment: Faces Faces Pain Scale: Hurts little more Pain Location: back Pain Descriptors / Indicators: Aching;Sore Pain Intervention(s): Monitored during session;Repositioned     Hand Dominance Right   Extremity/Trunk Assessment Upper Extremity Assessment Upper Extremity Assessment: Overall WFL for tasks assessed   Lower Extremity Assessment Lower Extremity Assessment: Defer to PT evaluation       Communication Communication Communication: No difficulties   Cognition Arousal/Alertness: Awake/alert Behavior During Therapy: WFL for tasks assessed/performed Overall Cognitive Status: Within Functional Limits for tasks assessed  General Comments       Exercises     Shoulder Instructions      Home Living Family/patient expects to be discharged to:: Private residence Living Arrangements: Spouse/significant other Available Help at Discharge: Family;Available 24 hours/day Type of Home: House Home Access: Stairs to enter [employer] of Steps: 2 Entrance Stairs-Rails: Can reach both Home Layout: Multi-level;Other  (Comment) Management consultant)     Bathroom Shower/Tub: Walk-in shower         Home Equipment: Bedside commode;Walker - 2 wheels;Cane - single point          Prior Functioning/Environment Level of Independence: Independent;Independent with assistive device(s)        Comments: difficulty with LB ADLs, has reacher        OT Problem List: Impaired balance (sitting and/or standing);Decreased knowledge of use of DME or AE;Decreased knowledge of precautions;Pain      OT Treatment/Interventions: Self-care/ADL training;DME and/or AE instruction;Therapeutic activities;Patient/family education;Balance training    OT Goals(Current goals can be found in the care plan section) Acute Rehab OT Goals Patient Stated Goal: not stated OT Goal Formulation: With patient Time For Goal Achievement: 10/26/16 Potential to Achieve Goals: Good ADL Goals Pt Will Perform Lower Body Bathing: with modified independence;sit to/from stand Pt Will Perform Lower Body Dressing: with modified independence;sit to/from stand Pt Will Transfer to Toilet: with modified independence;ambulating Pt Will Perform Toileting - Clothing Manipulation and hygiene: with modified independence;sit to/from stand;sitting/lateral leans Pt Will Perform Tub/Shower Transfer: Shower transfer;with supervision;ambulating;3 in 1;rolling walker Additional ADL Goal #1: Pt will complete bed mobility at supervision level to prepare for OOB ADLs.   OT Frequency: Min 2X/week   Barriers to D/C:            Co-evaluation              AM-PAC PT "6 Clicks" Daily Activity     Outcome Measure Help from another person eating meals?: None Help from another person taking care of personal grooming?: None Help from another person toileting, which includes using toliet, bedpan, or urinal?: A Little Help from another person bathing (including washing, rinsing, drying)?: A Little Help from another person to put on and taking off regular upper body  clothing?: None Help from another person to put on and taking off regular lower body clothing?: A Little 6 Click Score: 21   End of Session Equipment Utilized During Treatment: Rolling walker;Back brace  Activity Tolerance: Patient tolerated treatment well Patient left: in chair;with call bell/phone within reach  OT Visit Diagnosis: Unsteadiness on feet (R26.81);Pain                Time: 2671-2458 OT Time Calculation (min): 16 min Charges:  OT General Charges $OT Visit: 1 Visit OT Evaluation $OT Eval Low Complexity: 1 Low G-Codes:       Hortencia Pilar 10/19/2016, 10:50 AM

## 2016-10-20 ENCOUNTER — Telehealth: Payer: Self-pay

## 2016-10-20 LAB — GLUCOSE, CAPILLARY: Glucose-Capillary: 139 mg/dL — ABNORMAL HIGH (ref 65–99)

## 2016-10-20 MED ORDER — OXYCODONE HCL 5 MG PO TABS: 5.0000 mg | ORAL_TABLET | Freq: Four times a day (QID) | ORAL | 0 refills | Status: DC | PRN

## 2016-10-20 MED ORDER — METHOCARBAMOL 500 MG PO TABS: 500.0000 mg | ORAL_TABLET | Freq: Three times a day (TID) | ORAL | 1 refills | Status: DC | PRN

## 2016-10-20 NOTE — Care Management Note (Signed)
Case Management Note  Patient Details  Name: Carlos Dawson MRN: [id number] Date of Birth: 1940/12/23  Subjective/Objective:    Pt s/p lumbar fusion.  He is from home with his spouse.          Action/Plan: No f/u and no DME per PT/OT. Pt with orders for walker and 3 in1 . Pt states he has this equipment at home. Pt has PCP, insurance and transportation home. No further needs per CM.   Expected Discharge Date:  10/20/16               Expected Discharge Plan:  Home/Self Care  In-House Referral:     Discharge planning Services  CM Consult  Post Acute Care Choice:  Durable Medical Equipment (pt refused) Choice offered to:     DME Arranged:    DME Agency:     HH Arranged:    HH Agency:     Status of Service:  Completed, signed off  If discussed at [employer], dates discussed:    Additional Comments:  Pollie Friar, RN 10/20/2016, 8:17 AM

## 2016-10-20 NOTE — Progress Notes (Signed)
Physical Therapy Treatment Patient Details Name: Carlos Dawson MRN: [id number] DOB: 1940-08-05 Today's Date: 10/20/2016    History of Present Illness Pt is a 76 y/o male s/p PLF L1-L4 with segmental fixation at L1-L5 and decompression. PMH includes, but not limited to ascending aortic aneurysm, CAD, CHF, HTN, DM, thoracic aortic aneurysm. H/o previous back surgery in April 2018.    PT Comments    Pt making good progress and successfully completed stair training this session. PT will continue to follow acutely. Pt reported the plan is to d/c home today.  Follow Up Recommendations  No PT follow up;Supervision - Intermittent     Equipment Recommendations  None recommended by PT    Recommendations for Other Services       Precautions / Restrictions Precautions Precautions: Back Precaution Comments: PT reviewed 3/3 back precautions with pt Required Braces or Orthoses: Spinal Brace Spinal Brace: Lumbar corset;Applied in sitting position Restrictions Weight Bearing Restrictions: No    Mobility  Bed Mobility               General bed mobility comments: pt OOB in recliner chair upon arrival  Transfers Overall transfer level: Needs assistance Equipment used: Rolling walker (2 wheeled) Transfers: Sit to/from Stand Sit to Stand: Supervision         General transfer comment: increased time, good technique, supervision for safety  Ambulation/Gait Ambulation/Gait assistance: Supervision Ambulation Distance (Feet): 300 Feet Assistive device: Rolling walker (2 wheeled) Gait Pattern/deviations: Step-through pattern;Decreased step length - right;Decreased step length - left Gait velocity: decreased Gait velocity interpretation: Below normal speed for age/gender General Gait Details: steady gait with use of RW and supervision for safety   Stairs Stairs: Yes   Stair Management: Two rails;Alternating pattern;Forwards Number of Stairs: 2 General stair comments: no  instability or LOB  Wheelchair Mobility    Modified Rankin (Stroke Patients Only)       Balance Overall balance assessment: Needs assistance Sitting-balance support: Feet supported Sitting balance-Leahy Scale: Good     Standing balance support: During functional activity;Bilateral upper extremity supported Standing balance-Leahy Scale: Poor Standing balance comment: reliant on UEs on RW                            Cognition Arousal/Alertness: Awake/alert Behavior During Therapy: WFL for tasks assessed/performed Overall Cognitive Status: Within Functional Limits for tasks assessed                                        Exercises      General Comments        Pertinent Vitals/Pain Pain Assessment: 0-10 Pain Score: 3  Pain Location: back Pain Descriptors / Indicators: Aching;Sore Pain Intervention(s): Monitored during session;Repositioned    Home Living                      Prior Function            PT Goals (current goals can now be found in the care plan section) Acute Rehab PT Goals PT Goal Formulation: With patient Time For Goal Achievement: 11/02/16 Potential to Achieve Goals: Good Progress towards PT goals: Progressing toward goals    Frequency    Min 5X/week      PT Plan Current plan remains appropriate    Co-evaluation  AM-PAC PT "6 Clicks" Daily Activity  Outcome Measure  Difficulty turning over in bed (including adjusting bedclothes, sheets and blankets)?: A Little Difficulty moving from lying on back to sitting on the side of the bed? : A Little Difficulty sitting down on and standing up from a chair with arms (e.g., wheelchair, bedside commode, etc,.)?: A Little Help needed moving to and from a bed to chair (including a wheelchair)?: None Help needed walking in hospital room?: None Help needed climbing 3-5 steps with a railing? : None 6 Click Score: 21    End of Session Equipment  Utilized During Treatment: Gait belt;Back brace Activity Tolerance: Patient tolerated treatment well Patient left: in chair;with call bell/phone within reach Nurse Communication: Mobility status PT Visit Diagnosis: Other abnormalities of gait and mobility (R26.89);Pain Pain - part of body:  (back)     Time: 9024-0973 PT Time Calculation (min) (ACUTE ONLY): 12 min  Charges:  $Gait Training: 8-22 mins                    G Codes:       Stephan, [place], [place] 10/20/2016, 9:01 AM

## 2016-10-20 NOTE — Telephone Encounter (Signed)
Received surgical clearance from [facility].Dr.Jordan advised ok to hold Xarelto 48 to 72 hours prior to surgery.Clearance faxed back to fax # [phone].

## 2016-10-20 NOTE — Discharge Summary (Signed)
Physician Discharge Summary  Patient ID: Carlos Dawson MRN: [id number] DOB/AGE: June 27, 1940 76 y.o.  Admit date: 10/18/2016 Discharge date: 10/20/2016  Admission Diagnoses: lumbar spondylosis/ stenosis    Discharge Diagnoses: same   Discharged Condition: good  Hospital Course: The patient was admitted on 10/18/2016 and taken to the operating room where the patient underwent L1-5 fusion. The patient tolerated the procedure well and was taken to the recovery room and then to the floor in stable condition. The hospital course was routine. There were no complications. The wound remained clean dry and intact. Pt had appropriate back soreness. No complaints of leg pain or new N/T/W. The patient remained afebrile with stable vital signs, and tolerated a regular diet. The patient continued to increase activities, and pain was well controlled with oral pain medications.   Consults: None  Significant Diagnostic Studies:  Results for orders placed or performed during the hospital encounter of 10/18/16  Glucose, capillary  Result Value Ref Range   Glucose-Capillary 165 (H) 65 - 99 mg/dL  Glucose, capillary  Result Value Ref Range   Glucose-Capillary 167 (H) 65 - 99 mg/dL   Comment 1 Notify RN    Comment 2 Document in Chart   Glucose, capillary  Result Value Ref Range   Glucose-Capillary 230 (H) 65 - 99 mg/dL   Comment 1 Notify RN    Comment 2 Document in Chart   Glucose, capillary  Result Value Ref Range   Glucose-Capillary 171 (H) 65 - 99 mg/dL   Comment 1 Notify RN    Comment 2 Document in Chart   Glucose, capillary  Result Value Ref Range   Glucose-Capillary 147 (H) 65 - 99 mg/dL   Comment 1 Notify RN    Comment 2 Document in Chart   Glucose, capillary  Result Value Ref Range   Glucose-Capillary 154 (H) 65 - 99 mg/dL  Glucose, capillary  Result Value Ref Range   Glucose-Capillary 162 (H) 65 - 99 mg/dL   Comment 1 Notify RN    Comment 2 Document in Chart   Glucose,  capillary  Result Value Ref Range   Glucose-Capillary 139 (H) 65 - 99 mg/dL   Comment 1 Notify RN    Comment 2 Document in Chart     Chest 2 View  Result Date: 10/16/2016 CLINICAL DATA:  Preop lumbar surgery.  Diabetes, hypertension EXAM: CHEST  2 VIEW COMPARISON:  09/14/2016 FINDINGS: Heart is upper limits normal in size. Lingular scarring. No confluent opacity on the right. No effusions or acute bony abnormality. IMPRESSION: Lingular scarring.  No active disease. Electronically Signed   By: Rolm Baptise M.D.   On: 10/16/2016 11:31   Dg Lumbar Spine 2-3 Views  Result Date: 10/18/2016 CLINICAL DATA:  Status post surgical posterior fusion of lumbar spine. EXAM: DG C-ARM 61-120 MIN; LUMBAR SPINE - 2-3 VIEW FLUOROSCOPY TIME:  1 minutes 28 seconds. COMPARISON:  CT scan of August 22, 2016. FINDINGS: Three intraoperative fluoroscopic images were obtained of the lower lumbar spine. These demonstrate the patient be status post surgical posterior fusion of L1, L2, L3, L4 and L5 with bilateral intrapedicular screw placement interbody fusion is noted at L4-5. Good alignment of vertebral bodies is noted. IMPRESSION: Status post surgical posterior fusion as described above. Electronically Signed   By: Marijo Conception, M.D.   On: 10/18/2016 11:58   Dg C-arm 1-60 Min  Result Date: 10/18/2016 CLINICAL DATA:  Status post surgical posterior fusion of lumbar spine. EXAM: DG C-ARM 61-120 MIN;  LUMBAR SPINE - 2-3 VIEW FLUOROSCOPY TIME:  1 minutes 28 seconds. COMPARISON:  CT scan of August 22, 2016. FINDINGS: Three intraoperative fluoroscopic images were obtained of the lower lumbar spine. These demonstrate the patient be status post surgical posterior fusion of L1, L2, L3, L4 and L5 with bilateral intrapedicular screw placement interbody fusion is noted at L4-5. Good alignment of vertebral bodies is noted. IMPRESSION: Status post surgical posterior fusion as described above. Electronically Signed   By: Marijo Conception, M.D.    On: 10/18/2016 11:58    Antibiotics:  Anti-infectives    Start     Dose/Rate Route Frequency Ordered Stop   10/18/16 2000  ceFAZolin (ANCEF) IVPB 2g/100 mL premix     2 g 200 mL/hr over 30 Minutes Intravenous Every 8 hours 10/18/16 1527 10/19/16 0434   10/18/16 1130  ceFAZolin (ANCEF) 3 g in dextrose 5 % 50 mL IVPB  Status:  Discontinued     3 g 130 mL/hr over 30 Minutes Intravenous To Surgery 10/18/16 1123 10/18/16 2031   10/18/16 1125  vancomycin (VANCOCIN) powder  Status:  Discontinued       As needed 10/18/16 1113 10/18/16 1223   10/18/16 0846  bacitracin 50,000 Units in sodium chloride irrigation 0.9 % 500 mL irrigation  Status:  Discontinued       As needed 10/18/16 0846 10/18/16 1223   10/18/16 0700  ceFAZolin (ANCEF) 3 g in dextrose 5 % 50 mL IVPB     3 g 130 mL/hr over 30 Minutes Intravenous To Surgery 10/17/16 1337 10/18/16 1209      Discharge Exam: Blood pressure (!) 116/57, pulse 91, temperature 98.1 F (36.7 C), temperature source Oral, resp. rate 18, height 5\' 6"  (1.676 m), weight 129.1 kg (284 lb 11.2 oz), SpO2 94 %. Neurologic: Grossly normal Dressing dry  Discharge Medications:   Allergies as of 10/20/2016      Reactions   Adhesive [tape] Itching, Rash   Latex Itching, Rash, Other (See Comments)   Bandaids   Morphine Itching      Medication List    TAKE these medications   acetaminophen 500 MG tablet Commonly known as:  TYLENOL Take 1,000 mg by mouth every 8 (eight) hours as needed for mild pain.   albuterol 108 (90 Base) MCG/ACT inhaler Commonly known as:  PROVENTIL HFA;VENTOLIN HFA Inhale 2 puffs into the lungs every 4 (four) hours as needed for wheezing or shortness of breath.   atorvastatin 10 MG tablet Commonly known as:  LIPITOR Take 10 mg by mouth daily at 6 PM.   budesonide-formoterol 160-4.5 MCG/ACT inhaler Commonly known as:  SYMBICORT Inhale 2 puffs into the lungs 2 (two) times daily as needed (for shortness of breath).   celecoxib  200 MG capsule Commonly known as:  CELEBREX Take 200 mg by mouth 2 (two) times daily.   eucerin cream Apply 1 application topically as needed for dry skin.   fluticasone 50 MCG/ACT nasal spray Commonly known as:  FLONASE Place 2 sprays into both nostrils daily as needed for allergies or rhinitis.   losartan 25 MG tablet Commonly known as:  COZAAR Take 25 mg by mouth daily.   metFORMIN 500 MG 24 hr tablet Commonly known as:  GLUCOPHAGE-XR Take 500 mg by mouth daily before supper.   methocarbamol 500 MG tablet Commonly known as:  ROBAXIN Take 1 tablet (500 mg total) by mouth every 8 (eight) hours as needed for muscle spasms. What changed:  when to take this  metoprolol tartrate 25 MG tablet Commonly known as:  LOPRESSOR Take 1 tablet (25 mg total) by mouth 2 (two) times daily. START WITH 1/2 TAB X3DAYS THEN WHOLE TAB,MAY TAKE EXTRA 1/2 TAB IF SBP>110   montelukast 10 MG tablet Commonly known as:  SINGULAIR Take 10 mg by mouth at bedtime.   nitroGLYCERIN 0.4 MG SL tablet Commonly known as:  NITROSTAT Place 1 tablet (0.4 mg total) under the tongue every 5 (five) minutes as needed for chest pain.   oxyCODONE 5 MG immediate release tablet Commonly known as:  Oxy IR/ROXICODONE Take 1 tablet (5 mg total) by mouth every 6 (six) hours as needed for breakthrough pain.   pantoprazole 40 MG tablet Commonly known as:  PROTONIX Take 1 tablet (40 mg total) by mouth daily.   potassium chloride SA 20 MEQ tablet Commonly known as:  KLOR-CON M20 Take 2 tablets (40 mEq total) by mouth 2 (two) times daily.   psyllium 58.6 % packet Commonly known as:  METAMUCIL Take 1 packet by mouth daily.   rivaroxaban 20 MG Tabs tablet Commonly known as:  XARELTO Take 20 mg by mouth daily with supper.   sildenafil 100 MG tablet Commonly known as:  VIAGRA Take 100 mg by mouth as needed for erectile dysfunction.   SYSTANE OP Place 1 drop into both eyes as needed (for dry eyes).   tamsulosin  0.4 MG Caps capsule Commonly known as:  FLOMAX Take 0.4 mg by mouth every evening.   topiramate 25 MG capsule Commonly known as:  TOPAMAX Take 50 mg by mouth 2 (two) times daily.   traMADol 50 MG tablet Commonly known as:  ULTRAM Take 50 mg by mouth every 12 (twelve) hours as needed for moderate pain.   Vitamin D-3 5000 units Tabs Take 5,000 Units by mouth daily.            Durable Medical Equipment        Start     Ordered   10/18/16 1834  DME Walker rolling  Once    Question:  Patient needs a walker to treat with the following condition  Answer:  S/P lumbar fusion   10/18/16 1833   10/18/16 1834  DME 3 n 1  Once     10/18/16 1833       Discharge Care Instructions        Start     Ordered   10/20/16 0000  methocarbamol (ROBAXIN) 500 MG tablet  Every 8 hours PRN     10/20/16 0742   10/20/16 0000  oxyCODONE (OXY IR/ROXICODONE) 5 MG immediate release tablet  Every 6 hours PRN     10/20/16 0742   10/20/16 0000  Increase activity slowly     10/20/16 0742   10/20/16 0000  Diet - low sodium heart healthy     10/20/16 0742   10/20/16 0000  Discharge instructions    Comments:  Start xarelto in 3 days   10/20/16 0742   10/20/16 0000  Call MD for:  temperature >100.4     10/20/16 0742   10/20/16 0000  Call MD for:  persistant nausea and vomiting     10/20/16 0742   10/20/16 0000  Call MD for:  severe uncontrolled pain     10/20/16 0742   10/20/16 0000  Call MD for:  redness, tenderness, or signs of infection (pain, swelling, redness, odor or green/yellow discharge around incision site)     10/20/16 0742   10/20/16 0000  Call MD for:  difficulty breathing, headache or visual disturbances     10/20/16 0742   10/20/16 0000  Remove dressing in 48 hours     10/20/16 0742      Disposition: home   Final Dx: L1-5 fusion  Discharge Instructions    Call MD for:  difficulty breathing, headache or visual disturbances    Complete by:  As directed    Call MD for:   persistant nausea and vomiting    Complete by:  As directed    Call MD for:  redness, tenderness, or signs of infection (pain, swelling, redness, odor or green/yellow discharge around incision site)    Complete by:  As directed    Call MD for:  severe uncontrolled pain    Complete by:  As directed    Call MD for:  temperature >100.4    Complete by:  As directed    Diet - low sodium heart healthy    Complete by:  As directed    Discharge instructions    Complete by:  As directed    Start xarelto in 3 days   Increase activity slowly    Complete by:  As directed    Remove dressing in 48 hours    Complete by:  As directed          Signed: Paytyn Mesta S 10/20/2016, 7:42 AM

## 2016-10-20 NOTE — Progress Notes (Signed)
Pt being discharged per orders from MD. Pt and spouse educated on discharge instructions. All questions and concerns were addressed. Pt's IV was already removed prior to discharge. Pt exited hospital via wheelchair.

## 2016-11-04 ENCOUNTER — Other Ambulatory Visit: Payer: Self-pay | Admitting: Cardiology

## 2016-11-06 DIAGNOSIS — C61 Malignant neoplasm of prostate: Secondary | ICD-10-CM | POA: Diagnosis not present

## 2016-11-06 DIAGNOSIS — R9721 Rising PSA following treatment for malignant neoplasm of prostate: Secondary | ICD-10-CM | POA: Diagnosis not present

## 2016-11-06 DIAGNOSIS — N529 Male erectile dysfunction, unspecified: Secondary | ICD-10-CM | POA: Diagnosis not present

## 2016-11-06 DIAGNOSIS — N3941 Urge incontinence: Secondary | ICD-10-CM | POA: Diagnosis not present

## 2016-11-06 DIAGNOSIS — Z23 Encounter for immunization: Secondary | ICD-10-CM | POA: Diagnosis not present

## 2016-11-08 ENCOUNTER — Other Ambulatory Visit: Payer: Self-pay

## 2016-11-08 MED ORDER — METOPROLOL TARTRATE 25 MG PO TABS: 25.0000 mg | ORAL_TABLET | Freq: Two times a day (BID) | ORAL | 3 refills | Status: DC

## 2016-11-08 NOTE — Telephone Encounter (Signed)
Resent Metoprolol to Pharmacy and he stated the they lost some of the rx's when the power went out. Rx resent to pharmacy.

## 2016-11-16 ENCOUNTER — Ambulatory Visit (INDEPENDENT_AMBULATORY_CARE_PROVIDER_SITE_OTHER): Payer: Medicare Other | Admitting: Cardiothoracic Surgery

## 2016-11-16 ENCOUNTER — Encounter: Payer: Self-pay | Admitting: Cardiothoracic Surgery

## 2016-11-16 VITALS — BP 105/59 | HR 76 | Resp 18 | Ht 66.0 in | Wt 262.0 lb

## 2016-11-16 DIAGNOSIS — I712 Thoracic aortic aneurysm, without rupture, unspecified: Secondary | ICD-10-CM

## 2016-11-16 DIAGNOSIS — I2510 Atherosclerotic heart disease of native coronary artery without angina pectoris: Secondary | ICD-10-CM

## 2016-11-16 NOTE — Progress Notes (Signed)
[address]       [place] [address]             [phone]                   Ran C Svehla Palmas Medical Record #[id number] Date of Birth: [birth year]  Referring: Martinique, [name], MD Primary Care: Josetta Huddle, MD  Chief Complaint:    Chief Complaint  Patient presents with  . TAA    1 yr f/u with CTA CHEST on 09/15/16    History of Present Illness:    Patient returns today for followup CT scan one year after previous scan for evaluation of dilated ascending  Aorta. He  has known CAD with  a stent was placed in his circumflex coronary artery in the past. He had a cardiac catheterization in April 2016 .  patient's been diagnosed with prostate cancer in his currently undergoing treatment at [facility].  Patient has significant underlying pulmonary disease.  He has had difficulty ambulating because of chronic back pain.  He is also on anticoagulation for atrial fibrillation.  He came to the emergency room in September with rapid atrial fibrillation was cardioverted.  He is also had debilitating back pain and underwent back fusion several weeks ago.  During his evaluation in the emergency room a CT scan of the chest was done so we did not repeat it on his visit today.    Current Activity/ Functional Status: Patient is independent with mobility/ambulation, transfers, ADL's, IADL's.   Past Medical History  Diagnosis Date  . Hypertension   . Obesities, morbid   . Pulmonary embolism     2008  . Anticoagulant long-term use   . CAD (coronary artery disease)     a. s/p BMS to 2nd OM in Sept 2012; b. LexiScan Myoview (12/2012):  Inf infarct; bowel and motion artifact make study difficult to interpret; no ischemia; not gated; Low Risk  . OA (osteoarthritis)   . LVH (left ventricular hypertrophy)   . Colonic polyp   . Mild intermittent asthma   . Hemorrhoids   . Generalized headaches   . SOB (shortness of breath)     using oxygen with exercise   . Diastolic dysfunction   . ASCVD (arteriosclerotic cardiovascular disease)     Prior BMS to the 2nd OM in September 2012; with repeat cath in October showing patency  . GERD (gastroesophageal reflux disease)   . IBS (irritable bowel syndrome)   . Aortic root enlargement   .        Marland Kitchen PAF (paroxysmal atrial fibrillation)     treated with Coumadin  . Hearing loss   . Contact lens/glasses fitting   . OSA (obstructive sleep apnea)     PSG 03/30/97 AHI 21, BPAP 13/9  . Sleep apnea   . Prostate ca   . Thoracic aortic aneurysm     Aortic Size Index=     5.0    /Body surface area is 2.43 meters squared. = 2.05  < 2.75 cm/m2      4% risk per year 2.75 to 4.25          8% risk per year > 4.25 cm/m2    20% risk per year   Stable aneurysmal dilation of the ascending aorta with maximum AP diameter of 4.8 cm. Stable area of narrowing of the proximal most portion of the descending aorta measuring 2 cm.,  previously identified as an area of coarctation. No evidence of aortic dissection.  Coronary artery disease.  Normal appearance of the lungs.   Electronically Signed   By: Fidela Salisbury M.D.   On: 10/01/2014 08:50      Past Surgical History:  Procedure Laterality Date  . ACHILLES TENDON REPAIR    . APPENDECTOMY    . BACK SURGERY    . CARDIAC CATHETERIZATION  2006  . CARDIAC CATHETERIZATION  October 2012   Stent patent  . CARPAL TUNNEL RELEASE     LEFT  . CERVICAL SPINE SURGERY  06/02/2010   lower back and neck  . CHOLECYSTECTOMY    . COLONOSCOPY WITH PROPOFOL N/A 12/29/2014   Procedure: COLONOSCOPY WITH PROPOFOL;  Surgeon: Garlan Fair, MD;  Location: [facility];  Service: Endoscopy;  Laterality: N/A;  . CORONARY STENT PLACEMENT  Sept 2012   2nd OM with BMS  . EYE SURGERY     bilateral cataract  . HEMORROIDECTOMY    . LAMINECTOMY  05/30/2012   L 4 L5  . LAMINECTOMY WITH POSTERIOR LATERAL ARTHRODESIS LEVEL 3 N/A 10/18/2016   Procedure: Posterior Lateral Fusion - Lumbar One-Four,  segmental instrumentation Lumbar One-Five,  decompression,;  Surgeon: Eustace Moore, MD;  Location: [facility] OR;  Service: Neurosurgery;  Laterality: N/A;  . LAPAROSCOPIC GASTRIC BANDING    . LEFT AND RIGHT HEART CATHETERIZATION WITH CORONARY ANGIOGRAM N/A 05/07/2014   Procedure: LEFT AND RIGHT HEART CATHETERIZATION WITH CORONARY ANGIOGRAM;  Surgeon: [name] Martinique, MD;  Location: [facility] CATH LAB;  Service: Cardiovascular;  Laterality: N/A;  . LUMBAR LAMINECTOMY/DECOMPRESSION MICRODISCECTOMY N/A 05/04/2016   Procedure: Laminectomy and Foraminotomy - Thoracic twelve-Lumbar one -Posterior Fusion Lumbar one-two;  Surgeon: Eustace Moore, MD;  Location: [place];  Service: Neurosurgery;  Laterality: N/A;  . POSTERIOR LUMBAR FUSION  10/18/2016  . ROTATOR CUFF REPAIR     both  . TONSILLECTOMY    . TRIGGER FINGER RELEASE     LEFT  . UVULOPALATOPHARYNGOPLASTY    . VASECTOMY      Family History  Problem Relation Age of Onset  . Heart disease Mother   . Diabetes Mother   . Other Mother        stent placement  . Emphysema Father 33  . Heart attack Sister     Social History   Social History  . Marital status: Married    Spouse name: N/A  . Number of children: 3  . Years of education: N/A   Occupational History  . Retired from [employer] Retired   Social History Main Topics  . Smoking status: Former Smoker    Packs/day: 1.50    Years: 30.00    Quit date: 01/24/1992  . Smokeless tobacco: Never Used     Comment: Stopped in 1983  . Alcohol use No  . Drug use: No  . Sexual activity: Yes   Other Topics Concern  . Not on file   Social History Narrative  . No narrative on file    History  Smoking Status  . Former Smoker  . Packs/day: 1.50  . Years: 30.00  . Quit date: 01/24/1992  Smokeless Tobacco  . Never Used    Comment: Stopped in 1983    History  Alcohol Use No     Allergies  Allergen Reactions  . Adhesive [Tape] Itching and Rash  . Latex Itching, Rash and Other (See Comments)     Bandaids  . Morphine Itching    Current Outpatient  Prescriptions  Medication Sig Dispense Refill  . acetaminophen (TYLENOL) 500 MG tablet Take 1,000 mg by mouth every 8 (eight) hours as needed for mild pain.     Marland Kitchen albuterol (PROVENTIL HFA;VENTOLIN HFA) 108 (90 Base) MCG/ACT inhaler Inhale 2 puffs into the lungs every 4 (four) hours as needed for wheezing or shortness of breath.    Marland Kitchen atorvastatin (LIPITOR) 10 MG tablet Take 10 mg by mouth daily at 6 PM.     . budesonide-formoterol (SYMBICORT) 160-4.5 MCG/ACT inhaler Inhale 2 puffs into the lungs 2 (two) times daily as needed (for shortness of breath).     . celecoxib (CELEBREX) 200 MG capsule Take 200 mg by mouth 2 (two) times daily.    . Cholecalciferol (VITAMIN D-3) 5000 units TABS Take 5,000 Units by mouth daily.    . fluticasone (FLONASE) 50 MCG/ACT nasal spray Place 2 sprays into both nostrils daily as needed for allergies or rhinitis.    Marland Kitchen losartan (COZAAR) 25 MG tablet TAKE 1 TABLET BY MOUTH EVERY DAY 90 tablet 1  . metFORMIN (GLUCOPHAGE-XR) 500 MG 24 hr tablet Take 500 mg by mouth daily before supper.    . methocarbamol (ROBAXIN) 500 MG tablet Take 1 tablet (500 mg total) by mouth every 8 (eight) hours as needed for muscle spasms. 60 tablet 1  . metoprolol tartrate (LOPRESSOR) 25 MG tablet Take 1 tablet (25 mg total) by mouth 2 (two) times daily. MAY TAKE EXTRA 1/2 TAB IF SBP>110 60 tablet 3  . montelukast (SINGULAIR) 10 MG tablet Take 10 mg by mouth at bedtime.    . nitroGLYCERIN (NITROSTAT) 0.4 MG SL tablet Place 1 tablet (0.4 mg total) under the tongue every 5 (five) minutes as needed for chest pain. 25 tablet 5  . pantoprazole (PROTONIX) 40 MG tablet Take 1 tablet (40 mg total) by mouth daily. 30 tablet 11  . Polyethyl Glycol-Propyl Glycol (SYSTANE OP) Place 1 drop into both eyes as needed (for dry eyes).    . potassium chloride SA (KLOR-CON M20) 20 MEQ tablet Take 2 tablets (40 mEq total) by mouth 2 (two) times daily. 360 tablet 2    . psyllium (METAMUCIL) 58.6 % packet Take 1 packet by mouth daily.    . rivaroxaban (XARELTO) 20 MG TABS tablet Take 20 mg by mouth daily with supper.     . sildenafil (VIAGRA) 100 MG tablet Take 100 mg by mouth as needed for erectile dysfunction.     . Skin Protectants, Misc. (EUCERIN) cream Apply 1 application topically as needed for dry skin.    . tamsulosin (FLOMAX) 0.4 MG CAPS Take 0.4 mg by mouth every evening.     . topiramate (TOPAMAX) 25 MG capsule Take 50 mg by mouth 2 (two) times daily.     . traMADol (ULTRAM) 50 MG tablet Take 50 mg by mouth every 12 (twelve) hours as needed for moderate pain.      No current facility-administered medications for this visit.        Review of Systems:     Cardiac Review of Systems: Y or N  Chest Pain [  n  ]  Resting SOB [n   ] Exertional SOB  Blue.Reese  ]  Orthopnea [ n ]   Pedal Edema [  n ]    Palpitations [n  ] Syncope  [ n]   Presyncope [ n  ]  General Review of Systems: [Y] = yes [  ]=no Constitional: recent weight change [ y]; anorexia [  ];  fatigue [ y ]; nausea [n  ]; night sweats [ n ]; fever [ n ]; or chills [n  ];                                                                                                                                          Dental: poor dentition[  ];   Eye : blurred vision [  ]; diplopia [   ]; vision changes [  ];  Amaurosis fugax[ n ]; Resp: cough [ n ];  wheezing[n  ];  hemoptysis[n  ]; shortness of breath[ y ]; paroxysmal nocturnal dyspnea[  ]; dyspnea on exertion[ y ]; or orthopnea[  ];  GI:  gallstones[  ], vomiting[  ];  dysphagia[  ]; melena[  ];  hematochezia [ n ]; heartburn[  ];   Hx of  Colonoscopy[ y ]; GU: kidney stones [  ]; hematuria[  ];   dysuria [  ];  nocturia[  ];  history of     obstruction [  ];             Skin: rash, swelling[  ];, hair loss[  ];  peripheral edema[  ];  or itching[  ]; Musculosketetal: myalgias[  ];  joint swelling[  ];  joint erythema[  ];  joint pain[  ];  back pain[   ];  Heme/Lymph: bruising[  ];  bleeding[  ];  anemia[  ];  Neuro: TIA[  ];  headaches[  ];  stroke[  ];  vertigo[  ];  seizures[  ];   paresthesias[  ];  difficulty walking[n  ];  Psych:depression[  ]; anxiety[  ];  Endocrine: diabetes[  ];  thyroid dysfunction[  ];  Immunizations: Flu Blue.Reese  ]; Pneumococcal[ y];  Other:  Physical Exam: BP (!) 105/59 (BP Location: Left Arm, Patient Position: Sitting, Cuff Size: Large)   Pulse 76   Resp 18   Ht 5\' 6"  (1.676 m)   Wt 262 lb (118.8 kg)   SpO2 94% Comment: ON RA  BMI 42.29 kg/m   General appearance: alert, cooperative, appears older than stated age and no distress Head: Normocephalic, without obvious abnormality, atraumatic Neck: no adenopathy, no carotid bruit, no JVD, supple, symmetrical, trachea midline and thyroid not enlarged, symmetric, no tenderness/mass/nodules Lymph nodes: Cervical, supraclavicular, and axillary nodes normal. Resp: diminished breath sounds bilaterally Back: symmetric, no curvature. ROM normal. No CVA tenderness. Cardio: irregularly irregular rhythm GI: soft, non-tender; bowel sounds normal; no masses,  no organomegaly Extremities: extremities normal, atraumatic, no cyanosis or edema and Homans sign is negative, no sign of DVT Neurologic: Grossly normal  Diagnostic Studies & Laboratory data:     Recent Radiology Findings:   CLINICAL DATA:  Chest pain x2 days, follow-up known thoracic aortic aneurysm  EXAM: CT ANGIOGRAPHY CHEST WITH CONTRAST  TECHNIQUE: Multidetector CT imaging of the chest was performed using the  standard protocol during bolus administration of intravenous contrast. Multiplanar CT image reconstructions and MIPs were obtained to evaluate the vascular anatomy.  CONTRAST:  90 mL Isovue 370 IV  COMPARISON:  10/14/2015  FINDINGS: Cardiovascular: Preferential opacification of the thoracic aorta. No evidence of aortic dissection.  Stable ascending thoracic aortic aneurysm,  measuring 5.0 cm (series 5/ image 53). Mild atherosclerotic calcifications.  Although not tailored for evaluation of the pulmonary arteries, there is no evidence of pulmonary embolism to the lobar level.  Heart is top-normal in size.  No pericardial effusion.  Mild coronary atherosclerosis of the LAD and left circumflex.  Mediastinum/Nodes: No suspicious mediastinal lymphadenopathy.  Visualized thyroid is unremarkable.  Lungs/Pleura: No suspicious pulmonary nodules.  No focal consolidation.  Mild dependent atelectasis in the bilateral lower lobes.  No pleural effusion or pneumothorax.  Upper Abdomen: Visualized upper abdomen is notable for mild hepatic steatosis and cholecystectomy clips.  Musculoskeletal: Degenerative changes of the visualized thoracolumbar spine.  Review of the MIP images confirms the above findings.  IMPRESSION:Stable 5.0 cm ascending thoracic aortic aneurysm. No evidence of aortic dissection.  Recommend semi-annual imaging followup by CTA or MRA and referral to cardiothoracic surgery if not already obtained. This recommendation follows 2010 ACCF/AHA/AATS/ACR/ASA/SCA/SCAI/SIR/STS/SVM Guidelines for the Diagnosis and Management of Patients With Thoracic Aortic Disease. Circulation. 2010; 121: H371-I967  No evidence of acute cardiopulmonary disease.  Aortic Atherosclerosis (ICD10-I70.0).   Electronically Signed   By: Julian Hy M.D.   On: 09/15/2016 01:33  I have independently reviewed the above radiology studies  and reviewed the findings with the patient.   CLINICAL DATA:  Followup ascending aortic dilatation  EXAM: CT ANGIOGRAPHY CHEST WITH CONTRAST  TECHNIQUE: Multidetector CT imaging of the chest was performed using the standard protocol during bolus administration of intravenous contrast. Multiplanar CT image reconstructions and MIPs were obtained to evaluate the vascular anatomy.  CONTRAST:  75 mL  Isovue 370.  Creatinine was obtained on site at [facility] at [address]Results: Creatinine 1.2 mg/dL.  COMPARISON:  09/01/2014  FINDINGS: Cardiovascular: Mild aortic calcifications are noted. Dilatation of the ascending aorta is again identified to 5 cm. Measurements are somewhat limited by patient motion artifact. Sino-tubular junction measures 4.6 cm. The aorta at the level of sinus Valsalva measures 5.5 cm. These measurements are stable from the prior exam. Normal distal tapering is seen. No dissection is identified. Bovine arch is again identified.  Coronary calcifications are seen similar to that seen on the prior exam. The pulmonary artery as visualized is within normal limits.  Mediastinum/Nodes: Thoracic inlet is within normal limits. No significant hilar or mediastinal adenopathy is identified. No axillary adenopathy is seen. The esophagus as visualize is within normal limits.  Lungs/Pleura: Mild dependent atelectatic changes are noted. No focal infiltrate or focal nodules are seen. No sizable effusion or pneumothorax is noted.  Upper Abdomen: Gastric lap band is seen in satisfactory position. The remainder the upper abdomen is within normal limits.  Musculoskeletal: Degenerative changes of the thoracic spine are noted.  Review of the MIP images confirms the above findings.  IMPRESSION: Dilatation of the ascending aorta to 5 cm. Given some slight variation in the imaging technique, this is felt to be relatively stable from the prior exam. Ascending thoracic aortic aneurysm. Recommend semi-annual imaging followup by CTA or MRA and referral to cardiothoracic surgery if not already obtained. This recommendation follows 2010 ACCF/AHA/AATS/ACR/ASA/SCA/SCAI/SIR/STS/SVM Guidelines for the Diagnosis and Management of Patients With Thoracic Aortic Disease. Circulation. 2010; 121: E938-B017  Otherwise stable appearance of the chest when  compared with the prior exam.   Electronically Signed   By: Inez Catalina M.D.   On: 10/14/2015 14:11  Ct Angio Chest Aorta W/cm &/or Wo/cm  10/01/2014   CLINICAL DATA:  Follow up thoracic aortic aneurysm. History of stent for blockage. Recently diagnosed prostate cancer.  EXAM: CT ANGIOGRAPHY CHEST WITH CONTRAST  TECHNIQUE: Multidetector CT imaging of the chest was performed using the standard protocol during bolus administration of intravenous contrast. Multiplanar CT image reconstructions and MIPs were obtained to evaluate the vascular anatomy.  CONTRAST:  75 cc Isovue 370.  COMPARISON:  09/11/2013  FINDINGS: There stable anulus mild dilation of the ascending aorta with maximum AP diameter of 4.8 cm. The diameter of the thoracic aorta at the arch is 4.4 cm. There is also a stable narrowing of the proximal descending aorta, downstream to the takeoff of the left subclavian artery, measuring 2 cm., as previously identified as possible area of coarctation. The remaining of the descending thoracic aorta is torturous but normal in caliber. There is bovine arch anatomic variation.  There is no evidence of pulmonary embolus.  The heart is normal in size. Conventional coronary artery anatomy is seen with atherosclerotic disease involving mostly the left system. There is no evidence of significant focal lung parenchymal consolidation, pleural effusion or pneumothorax. No masses are identified ; no abnormal focal contrast enhancement is seen.  No mediastinal or axillary lymphadenopathy is seen. The visualized portions of the thyroid gland are unremarkable in appearance.  The visualized portions of the liver and spleen are unremarkable.  The visualized portions of the pancreas, stomach, adrenal glands and kidneys are within normal limits. There has been a prior cholecystectomy.  No acute osseous abnormalities are seen. Thoracic spine diffuse idiopathic skeletal hyperostosis is noted.  Review of the MIP images  confirms the above findings.  IMPRESSION: Stable aneurysmal dilation of the ascending aorta with maximum AP diameter of 4.8 cm. Stable area of narrowing of the proximal most portion of the descending aorta measuring 2 cm., previously identified as an area of coarctation. No evidence of aortic dissection.  Coronary artery disease.  Normal appearance of the lungs.   Electronically Signed   By: Fidela Salisbury M.D.   On: 10/01/2014 08:50   Dg Chest 2 View  08/21/2013   CLINICAL DATA:  Followup of pneumonia. Productive cough. Congestion. Asthma. Ex-smoker.  EXAM: CHEST  2 VIEW  COMPARISON:  08/12/2013 and baseline radiograph of 05/21/2013  FINDINGS: Moderate thoracic spondylosis. Midline trachea. Moderate cardiomegaly. Pulmonary artery enlargement. Mediastinal contours otherwise within normal limits. No pleural effusion or pneumothorax. Lower lobe predominant interstitial thickening. Clearing of right lower lobe pneumonia.  IMPRESSION: Clearing of right lower lobe airspace disease/pneumonia.  Cardiomegaly with chronic interstitial thickening, likely related to smoking or chronic bronchitis.  Pulmonary artery enlargement suggests pulmonary arterial hypertension.   Electronically Signed   By: Abigail Miyamoto M.D.   On: 08/21/2013 09:47   Ct Angio Chest Aorta W/cm &/or Wo/cm  09/11/2013   CLINICAL DATA:  Thoracic aortic aneurysm.  EXAM: CT ANGIOGRAPHY CHEST WITH CONTRAST  TECHNIQUE: Multidetector CT imaging of the chest was performed using the standard protocol during bolus administration of intravenous contrast. Multiplanar CT image reconstructions and MIPs were obtained to evaluate the vascular anatomy.  CONTRAST:  41mL OMNIPAQUE IOHEXOL 350 MG/ML SOLN  COMPARISON:  01/06/2013 and 09/14/2011  FINDINGS: Lungs are adequately inflated without consolidation effusion. There is minimal linear scarring over the anterior right  upper lobe unchanged. 4 mm nodular density adjacent the minor fissure unchanged from 2013. There  is borderline cardiomegaly. Continued minimal calcification over the left main, lateral circumflex and anterior descending coronary arteries. No change in a right subcarinal lymph node measuring 1 cm by short axis.  There is continued evidence of aneurysmal dilatation of the ascending thoracic aorta which measures 4.8 cm in greatest AP diameter proximally which is unchanged. The thoracic aorta measures 4 cm in diameter at the level of the proximal arch without significant change. There is a relative area of narrowing of the posterior aortic arch 1.9 cm distal to the take-off of the left subclavian artery as the aorta measures 2.1 cm in diameter focally and increases in diameter to 3.2 cm immediately distal to this narrowing. Main pulmonary artery measures 4 cm in transverse diameter unchanged. Remaining mediastinal structures are unremarkable.  Images through the upper abdomen demonstrate evidence of prior cholecystectomy as well as surgical change over the region of the gastroesophageal junction with lap band apparatus in place. There are degenerative changes of the spine.  Review of the MIP images confirms the above findings.  IMPRESSION: Stable aneurysmal dilatation of the ascending thoracic aorta measuring 4.8 cm in AP diameter. Note that there is a focal relative area of narrowing of the aortic distal to the take-off of the left subclavian artery measuring 2.1 cm in diameter likely representing a degree of coarctation unchanged. No evidence of dissection.  Stable borderline cardiomegaly.  Postsurgical changes as described.   Electronically Signed   By: Marin Olp M.D.   On: 09/11/2013 11:07    Recent Lab Findings: Lab Results  Component Value Date   WBC 7.5 10/16/2016   HGB 12.3 (L) 10/16/2016   HCT 38.6 (L) 10/16/2016   PLT 162 10/16/2016   GLUCOSE 152 (H) 10/16/2016   CHOL 118 01/07/2013   TRIG 133 01/07/2013   HDL 40 01/07/2013   LDLCALC 51 01/07/2013   ALT 19 03/15/2016   AST 25 03/15/2016    NA 141 10/16/2016   K 3.6 10/16/2016   CL 109 10/16/2016   CREATININE 1.19 10/16/2016   BUN 18 10/16/2016   CO2 23 10/16/2016   TSH 1.29 05/11/2015   INR 1.16 10/16/2016   HGBA1C 7.4 (H) 10/16/2016   Aortic Size Index=    5.0   /Body surface area is 2.35 meters squared. =2.12 < 2.75 cm/m2      4% risk per year 2.75 to 4.25          8% risk per year > 4.25 cm/m2    20% risk per year   Assessment / Plan:   Stable appearance of the ascending aorta dilatation. Still 4.8cm mid ascending aorta and sinotubular ridge the sinuses of Valsalva proximally 5.0 cm. . With the patient's overall poor medical condition I would not recommend elective repair at this point. We'll plan to see the patient back in 6 months r with a followup CTA of the chest Most recent echocardiogram was in 2016 leave to the discretion of cardiology to follow-up on this.  Grace Isaac MD  Beeper [phone] Office [phone] 11/16/2016 2:42 PM

## 2016-11-16 NOTE — Patient Instructions (Signed)

## 2016-11-17 ENCOUNTER — Telehealth: Payer: Self-pay | Admitting: Cardiology

## 2016-11-17 NOTE — Telephone Encounter (Addendum)
Patient  States the symptoms are becoming for frequent.  Cramps in  The left thigh  , fore arms  And then it will radiate to the left side of the chest  Under left breast. Patient states not actually chest pain ,but  Pain are the cramping sesnations.   patient states  He has been well hydrated , but not eating much potassium-enrich foods.   patient states he is  taking  80 mg twice a day ( total 160 mg of furosemide ) And potassium 40 meq ( 2 tablets of 20 meq)  Twice a day   Furosemide on medication list   -per last ov 09/22/16 and 10/13/16  patient was taking medication can not tell when medication was taken off list possible while having elective surgery. Will place back on medication list.    Patient thinks he need potassium level checked. Patient aware will defer to Dr Martinique. suggest to patient to take teaspoon of mustard - to see it helps with cramps. Patient aware will defer to Dr Martinique contact him back

## 2016-11-17 NOTE — Telephone Encounter (Signed)
New Message     Pt is having a lot of cramping in his leg and it ran all the way up to his chest.  Pt states it is not chest pain it is like muscle cramp radiating from leg all the way up to chest.

## 2016-11-17 NOTE — Telephone Encounter (Signed)
I would check a BMET and magnesium level on him  Peter Martinique MD, [facility]

## 2016-11-20 DIAGNOSIS — I10 Essential (primary) hypertension: Secondary | ICD-10-CM | POA: Diagnosis not present

## 2016-11-20 DIAGNOSIS — E559 Vitamin D deficiency, unspecified: Secondary | ICD-10-CM | POA: Diagnosis not present

## 2016-11-20 DIAGNOSIS — R252 Cramp and spasm: Secondary | ICD-10-CM | POA: Diagnosis not present

## 2016-11-20 DIAGNOSIS — N400 Enlarged prostate without lower urinary tract symptoms: Secondary | ICD-10-CM | POA: Diagnosis not present

## 2016-11-20 DIAGNOSIS — I2510 Atherosclerotic heart disease of native coronary artery without angina pectoris: Secondary | ICD-10-CM | POA: Diagnosis not present

## 2016-11-20 DIAGNOSIS — E785 Hyperlipidemia, unspecified: Secondary | ICD-10-CM | POA: Diagnosis not present

## 2016-11-20 DIAGNOSIS — J4530 Mild persistent asthma, uncomplicated: Secondary | ICD-10-CM | POA: Diagnosis not present

## 2016-11-20 DIAGNOSIS — E119 Type 2 diabetes mellitus without complications: Secondary | ICD-10-CM | POA: Diagnosis not present

## 2016-11-20 DIAGNOSIS — I509 Heart failure, unspecified: Secondary | ICD-10-CM | POA: Diagnosis not present

## 2016-11-20 DIAGNOSIS — I480 Paroxysmal atrial fibrillation: Secondary | ICD-10-CM | POA: Diagnosis not present

## 2016-11-20 DIAGNOSIS — K219 Gastro-esophageal reflux disease without esophagitis: Secondary | ICD-10-CM | POA: Diagnosis not present

## 2016-11-20 DIAGNOSIS — I5030 Unspecified diastolic (congestive) heart failure: Secondary | ICD-10-CM | POA: Diagnosis not present

## 2016-11-20 NOTE — Telephone Encounter (Signed)
SPOKE TO PATIENT. PATIENT STATES HIS PRIMARY ORDER LABS THIS MORNING. WILL HAVE LABS RESULTS SENT TO OUR OFFICE.

## 2016-11-22 DIAGNOSIS — C61 Malignant neoplasm of prostate: Secondary | ICD-10-CM | POA: Diagnosis not present

## 2016-11-22 DIAGNOSIS — I5030 Unspecified diastolic (congestive) heart failure: Secondary | ICD-10-CM | POA: Diagnosis not present

## 2016-11-22 DIAGNOSIS — I480 Paroxysmal atrial fibrillation: Secondary | ICD-10-CM | POA: Diagnosis not present

## 2016-11-22 DIAGNOSIS — I509 Heart failure, unspecified: Secondary | ICD-10-CM | POA: Diagnosis not present

## 2016-11-22 DIAGNOSIS — N400 Enlarged prostate without lower urinary tract symptoms: Secondary | ICD-10-CM | POA: Diagnosis not present

## 2016-11-22 DIAGNOSIS — E785 Hyperlipidemia, unspecified: Secondary | ICD-10-CM | POA: Diagnosis not present

## 2016-11-22 DIAGNOSIS — J4530 Mild persistent asthma, uncomplicated: Secondary | ICD-10-CM | POA: Diagnosis not present

## 2016-11-22 DIAGNOSIS — E119 Type 2 diabetes mellitus without complications: Secondary | ICD-10-CM | POA: Diagnosis not present

## 2016-11-22 DIAGNOSIS — J45909 Unspecified asthma, uncomplicated: Secondary | ICD-10-CM | POA: Diagnosis not present

## 2016-11-22 DIAGNOSIS — I10 Essential (primary) hypertension: Secondary | ICD-10-CM | POA: Diagnosis not present

## 2016-11-22 DIAGNOSIS — I2510 Atherosclerotic heart disease of native coronary artery without angina pectoris: Secondary | ICD-10-CM | POA: Diagnosis not present

## 2016-11-27 DIAGNOSIS — M48062 Spinal stenosis, lumbar region with neurogenic claudication: Secondary | ICD-10-CM | POA: Diagnosis not present

## 2016-12-11 DIAGNOSIS — I2510 Atherosclerotic heart disease of native coronary artery without angina pectoris: Secondary | ICD-10-CM | POA: Insufficient documentation

## 2016-12-11 DIAGNOSIS — R519 Headache, unspecified: Secondary | ICD-10-CM | POA: Insufficient documentation

## 2016-12-11 DIAGNOSIS — I7789 Other specified disorders of arteries and arterioles: Secondary | ICD-10-CM | POA: Insufficient documentation

## 2016-12-11 DIAGNOSIS — H9190 Unspecified hearing loss, unspecified ear: Secondary | ICD-10-CM | POA: Insufficient documentation

## 2016-12-11 DIAGNOSIS — K219 Gastro-esophageal reflux disease without esophagitis: Secondary | ICD-10-CM | POA: Insufficient documentation

## 2016-12-11 DIAGNOSIS — K589 Irritable bowel syndrome without diarrhea: Secondary | ICD-10-CM | POA: Insufficient documentation

## 2016-12-11 DIAGNOSIS — C61 Malignant neoplasm of prostate: Secondary | ICD-10-CM | POA: Insufficient documentation

## 2016-12-11 DIAGNOSIS — Z5181 Encounter for therapeutic drug level monitoring: Secondary | ICD-10-CM | POA: Insufficient documentation

## 2016-12-11 DIAGNOSIS — I712 Thoracic aortic aneurysm, without rupture: Secondary | ICD-10-CM | POA: Insufficient documentation

## 2016-12-11 DIAGNOSIS — I480 Paroxysmal atrial fibrillation: Secondary | ICD-10-CM | POA: Insufficient documentation

## 2016-12-11 DIAGNOSIS — R51 Headache: Secondary | ICD-10-CM

## 2016-12-11 DIAGNOSIS — R0602 Shortness of breath: Secondary | ICD-10-CM | POA: Insufficient documentation

## 2016-12-11 DIAGNOSIS — K635 Polyp of colon: Secondary | ICD-10-CM | POA: Insufficient documentation

## 2016-12-11 DIAGNOSIS — I509 Heart failure, unspecified: Secondary | ICD-10-CM | POA: Insufficient documentation

## 2016-12-11 DIAGNOSIS — I7121 Aneurysm of the ascending aorta, without rupture: Secondary | ICD-10-CM | POA: Insufficient documentation

## 2016-12-11 DIAGNOSIS — Z460 Encounter for fitting and adjustment of spectacles and contact lenses: Secondary | ICD-10-CM | POA: Insufficient documentation

## 2016-12-11 DIAGNOSIS — J189 Pneumonia, unspecified organism: Secondary | ICD-10-CM | POA: Insufficient documentation

## 2016-12-11 DIAGNOSIS — I5189 Other ill-defined heart diseases: Secondary | ICD-10-CM | POA: Insufficient documentation

## 2016-12-11 DIAGNOSIS — J4520 Mild intermittent asthma, uncomplicated: Secondary | ICD-10-CM | POA: Insufficient documentation

## 2016-12-11 DIAGNOSIS — E119 Type 2 diabetes mellitus without complications: Secondary | ICD-10-CM | POA: Insufficient documentation

## 2016-12-11 DIAGNOSIS — G4730 Sleep apnea, unspecified: Secondary | ICD-10-CM | POA: Insufficient documentation

## 2016-12-19 NOTE — Progress Notes (Signed)
Cardiology Office Note    Date:  12/22/2016   ID:  MATTIX IMHOF, DOB 1940-06-02, MRN [id number]  PCP:  Josetta Huddle, MD  Cardiologist: Dr. Martinique   Chief Complaint  Patient presents with  . Shortness of Breath    History of Present Illness:    Carlos Dawson is a 76 y.o. male with past medical history of CAD (s/p BMS to 2nd OM in 2012, low-risk NST in 12/2012), PAF (on Xarelto), chronic diastolic CHF, prior PE, HTN, HLD, Type 2 DM, and known thoracic aortic aneurysm who is seen for follow up.   He was recently admitted from 8/23 - 09/15/2016 for evaluation of chest discomfort and palpitations. His initial EKG showed that he was in atrial fibrillation with RVR. He reported good compliance with his Xarelto and denied missing any doses, therefore a successful DCCV was performed in the ED at [facility].  He became hypotensive with SBP in the 80's following the procedure, therefore he was admitted overnight for further observation. His Losartan, Spironolactone, and Lasix were held at the time of discharge with plans to resume at follow-up if BP allowed. A CTA was also obtained during admission to rule out a recurrent PE and was negative for a PE but did show a stable 5.0 cm ascending thoracic aortic aneurysm. Antihypertensive therapy was resumed. On 10/20/16 he underwent L1-5 fusion by Dr. Ronnald Ramp.   On follow up today he notes that since his back surgery he does not get the thigh pain and fatigue with exertion that he did. He still has a pain in the right posterior back and flank that is chronic. He continues to note dyspnea with exertion. No increase in edema. No chest pain. Oxygen levels have been good. Is still wearing CPAP. Notes dyspnea is worse when he is carrying something.    Past Medical History:  Diagnosis Date  . Anticoagulant long-term use   . Aortic root enlargement ([place])   . Ascending aortic aneurysm [facility])    recent scan in October 2012 showing no change; followed by Dr.  Servando Snare  . ASCVD (arteriosclerotic cardiovascular disease)    Prior BMS to the 2nd OM in September 2012; with repeat cath in October showing patency  . CAD (coronary artery disease)    a. s/p BMS to 2nd OM in Sept 2012; b. LexiScan Myoview (12/2012):  Inf infarct; bowel and motion artifact make study difficult to interpret; no ischemia; not gated; Low Risk  . CHF (congestive heart failure) ([place])    no recent issues 10/13/14  . Colonic polyp   . Contact lens/glasses fitting   . Diabetes mellitus without complication (HCC)    metphormin, average 154 dx 2017  . Diastolic dysfunction   . Generalized headaches    neck stenosis  . GERD (gastroesophageal reflux disease)   . Hearing loss   . Hearing loss    more so on left  . Hemorrhoids   . Hypertension   . IBS (irritable bowel syndrome)   . LVH (left ventricular hypertrophy)   . Mild intermittent asthma   . OA (osteoarthritis)   . Obesities, morbid ([place])   . OSA (obstructive sleep apnea)    PSG 03/30/97 AHI 21, BPAP 13/9  . PAF (paroxysmal atrial fibrillation) ([place])    a. on Xarelto b. s/p DCCV in 08/2016  . Pneumonia   . Prostate CA [facility])    Oncologist  DR. Daralene Milch [facility] 09/24/14, undetermined tx   prostate  . Pulmonary embolism ([place])  2008  . Sleep apnea   . SOB (shortness of breath)    on excertion  . Thoracic aortic aneurysm (HCC)    Aortic Size Index=     5.0    /Body surface area is 2.43 meters squared. = 2.05  < 2.75 cm/m2      4% risk per year 2.75 to 4.25          8% risk per year > 4.25 cm/m2    20% risk per year   Stable aneurysmal dilation of the ascending aorta with maximum AP diameter of 4.8 cm. Stable area of narrowing of the proximal most portion of the descending aorta measuring 2 cm., previously identified as an area of coarctation. No evidence of aortic dissection.  Coronary artery disease.  Normal appearance of the lungs.   Electronically Signed   By: Fidela Salisbury M.D.   On: 10/01/2014 08:50       Past Surgical History:  Procedure Laterality Date  . ACHILLES TENDON REPAIR    . APPENDECTOMY    . BACK SURGERY    . CARDIAC CATHETERIZATION  2006  . CARDIAC CATHETERIZATION  October 2012   Stent patent  . CARPAL TUNNEL RELEASE     LEFT  . CERVICAL SPINE SURGERY  06/02/2010   lower back and neck  . CHOLECYSTECTOMY    . COLONOSCOPY WITH PROPOFOL N/A 12/29/2014   Procedure: COLONOSCOPY WITH PROPOFOL;  Surgeon: Garlan Fair, MD;  Location: [facility] ENDOSCOPY;  Service: Endoscopy;  Laterality: N/A;  . CORONARY STENT PLACEMENT  Sept 2012   2nd OM with BMS  . EYE SURGERY     bilateral cataract  . HEMORROIDECTOMY    . LAMINECTOMY  05/30/2012   L 4 L5  . LAMINECTOMY WITH POSTERIOR LATERAL ARTHRODESIS LEVEL 3 N/A 10/18/2016   Procedure: Posterior Lateral Fusion - Lumbar One-Four, segmental instrumentation Lumbar One-Five,  decompression,;  Surgeon: Eustace Moore, MD;  Location: [facility];  Service: Neurosurgery;  Laterality: N/A;  . LAPAROSCOPIC GASTRIC BANDING    . LEFT AND RIGHT HEART CATHETERIZATION WITH CORONARY ANGIOGRAM N/A 05/07/2014   Procedure: LEFT AND RIGHT HEART CATHETERIZATION WITH CORONARY ANGIOGRAM;  Surgeon: [name] Martinique, MD;  Location: [facility] CATH LAB;  Service: Cardiovascular;  Laterality: N/A;  . LUMBAR LAMINECTOMY/DECOMPRESSION MICRODISCECTOMY N/A 05/04/2016   Procedure: Laminectomy and Foraminotomy - Thoracic twelve-Lumbar one -Posterior Fusion Lumbar one-two;  Surgeon: Eustace Moore, MD;  Location: [place];  Service: Neurosurgery;  Laterality: N/A;  . POSTERIOR LUMBAR FUSION  10/18/2016  . ROTATOR CUFF REPAIR     both  . TONSILLECTOMY    . TRIGGER FINGER RELEASE     LEFT  . UVULOPALATOPHARYNGOPLASTY    . VASECTOMY      Current Medications: Outpatient Medications Prior to Visit  Medication Sig Dispense Refill  . acetaminophen (TYLENOL) 500 MG tablet Take 1,000 mg by mouth every 8 (eight) hours as needed for mild pain.     Marland Kitchen albuterol (PROVENTIL HFA;VENTOLIN HFA) 108 (90  Base) MCG/ACT inhaler Inhale 2 puffs into the lungs every 4 (four) hours as needed for wheezing or shortness of breath.    Marland Kitchen atorvastatin (LIPITOR) 10 MG tablet Take 10 mg by mouth daily at 6 PM.     . budesonide-formoterol (SYMBICORT) 160-4.5 MCG/ACT inhaler Inhale 2 puffs into the lungs 2 (two) times daily as needed (for shortness of breath).     . celecoxib (CELEBREX) 200 MG capsule Take 200 mg by mouth 2 (two) times daily.    Marland Kitchen  Cholecalciferol (VITAMIN D-3) 5000 units TABS Take 5,000 Units by mouth daily.    . fluticasone (FLONASE) 50 MCG/ACT nasal spray Place 2 sprays into both nostrils daily. 48 g 1  . furosemide (LASIX) 40 MG tablet Take 80 mg by mouth 2 (two) times daily.    Marland Kitchen losartan (COZAAR) 25 MG tablet TAKE 1 TABLET BY MOUTH EVERY DAY 90 tablet 1  . methocarbamol (ROBAXIN) 500 MG tablet Take 1 tablet (500 mg total) by mouth every 8 (eight) hours as needed for muscle spasms. 60 tablet 1  . metoprolol tartrate (LOPRESSOR) 25 MG tablet Take 1 tablet (25 mg total) by mouth 2 (two) times daily. MAY TAKE EXTRA 1/2 TAB IF SBP>110 60 tablet 3  . montelukast (SINGULAIR) 10 MG tablet Take 10 mg by mouth at bedtime.    . nitroGLYCERIN (NITROSTAT) 0.4 MG SL tablet Place 1 tablet (0.4 mg total) under the tongue every 5 (five) minutes as needed for chest pain. 25 tablet 5  . pantoprazole (PROTONIX) 40 MG tablet Take 1 tablet (40 mg total) by mouth daily. 30 tablet 11  . Polyethyl Glycol-Propyl Glycol (SYSTANE OP) Place 1 drop into both eyes as needed (for dry eyes).    . potassium chloride SA (KLOR-CON M20) 20 MEQ tablet Take 2 tablets (40 mEq total) by mouth 2 (two) times daily. 360 tablet 2  . psyllium (METAMUCIL) 58.6 % packet Take 1 packet by mouth daily.    . rivaroxaban (XARELTO) 20 MG TABS tablet Take 20 mg by mouth daily with supper.     . sildenafil (VIAGRA) 100 MG tablet Take 100 mg by mouth as needed for erectile dysfunction.     . Skin Protectants, Misc. (EUCERIN) cream Apply 1  application topically as needed for dry skin.    . tamsulosin (FLOMAX) 0.4 MG CAPS Take 0.4 mg by mouth every evening.     . topiramate (TOPAMAX) 25 MG capsule Take 50 mg by mouth 2 (two) times daily.     . traMADol (ULTRAM) 50 MG tablet Take 50 mg by mouth every 12 (twelve) hours as needed for moderate pain.     . metFORMIN (GLUCOPHAGE-XR) 500 MG 24 hr tablet Take 500 mg by mouth daily before supper.     No facility-administered medications prior to visit.      Allergies:   Adhesive [tape]; Latex; and Morphine   Social History   Socioeconomic History  . Marital status: Married    Spouse name: None  . Number of children: 3  . Years of education: None  . Highest education level: None  Social Needs  . Financial resource strain: None  . Food insecurity - worry: None  . Food insecurity - inability: None  . Transportation needs - medical: None  . Transportation needs - non-medical: None  Occupational History  . Occupation: Retired from Scientist, clinical (histocompatibility and immunogenetics): RETIRED  Tobacco Use  . Smoking status: Former Smoker    Packs/day: 1.50    Years: 30.00    Pack years: 45.00    Last attempt to quit: 01/24/1992    Years since quitting: 24.9  . Smokeless tobacco: Never Used  . Tobacco comment: Stopped in 1983  Substance and Sexual Activity  . Alcohol use: No  . Drug use: No  . Sexual activity: Yes  Other Topics Concern  . None  Social History Narrative  . None     Family History:  The patient's family history includes Diabetes in his mother; Emphysema (age of  onset: 9) in his father; Heart attack in his sister; Heart disease in his mother; Other in his mother.   Review of Systems:   Please see the history of present illness.    All other systems reviewed and are otherwise negative except as noted above.   Physical Exam:    VS:  BP (!) 92/58   Pulse 60   Ht 5\' 6"  (1.676 m)   Wt 268 lb 12.8 oz (121.9 kg)   SpO2 95%   BMI 43.39 kg/m    GENERAL:  Well appearing, morbidly obese  WM in NAD HEENT:  PERRL, EOMI, sclera are clear. Oropharynx is clear. NECK:  No jugular venous distention, carotid upstroke brisk and symmetric, no bruits, no thyromegaly or adenopathy LUNGS:  Clear to auscultation bilaterally CHEST:  Unremarkable HEART:  RRR,  PMI not displaced or sustained,S1 and S2 within normal limits, no S3, no S4: no clicks, no rubs, no murmurs ABD:  Soft, nontender. BS +, no masses or bruits. No hepatomegaly, no splenomegaly EXT:  2 + pulses throughout, tr-1+ edema, no cyanosis no clubbing SKIN:  Warm and dry.  No rashes NEURO:  Alert and oriented x 3. Cranial nerves II through XII intact. PSYCH:  Cognitively intact    Wt Readings from Last 3 Encounters:  12/22/16 268 lb 12.8 oz (121.9 kg)  11/16/16 262 lb (118.8 kg)  10/18/16 284 lb 11.2 oz (129.1 kg)      Studies/Labs Reviewed:   EKG:  EKG is not ordered today.   Recent Labs: 03/15/2016: ALT 19 09/14/2016: B Natriuretic Peptide 476.3; Magnesium 2.0 10/16/2016: BUN 18; Creatinine, Ser 1.19; Hemoglobin 12.3; Platelets 162; Potassium 3.6; Sodium 141   Lipid Panel    Component Value Date/Time   CHOL 118 01/07/2013 0410   TRIG 133 01/07/2013 0410   HDL 40 01/07/2013 0410   CHOLHDL 3.0 01/07/2013 0410   VLDL 27 01/07/2013 0410   LDLCALC 51 01/07/2013 0410   Labs dated 07/11/16: cholesterol 114, triglycerides 80, HDL 46, LDL 52. A1c 7.1%.   Additional studies/ records that were reviewed today include:   Echocardiogram: 04/2014 Study Conclusions  - Left ventricle: The cavity size was normal. Systolic function was normal. The estimated ejection fraction was in the range of 55% to 60%. Wall motion was normal; there were no regional wall motion abnormalities. There was an increased relative contribution of atrial contraction to ventricular filling. Doppler parameters are consistent with abnormal left ventricular relaxation (grade 1 diastolic dysfunction). - Aortic valve: Poorly visualized.  Valve area (VTI): 1.53 cm^2. Valve area (Vmax): 1.6 cm^2. Valve area (Vmean): 1.88 cm^2. - Aorta: Aortic root dimension: 47 mm (ED). - Aortic root: The aortic root was mild to moderately dilated. - Tricuspid valve: There was trivial regurgitation.  Assessment:    1. Chronic diastolic heart failure ([place])   2. Coronary artery disease involving native coronary artery of native heart without angina pectoris   3. Paroxysmal atrial fibrillation (HCC)      Plan:   In order of problems listed above:  1. Paroxysmal Atrial Fibrillation/ Long-term Anticoagulation - s/p DCCV in August 2018. Appears to be maintaining NSR well.  - This patients CHA2DS2-VASc Score and unadjusted Ischemic Stroke Rate (% per year) is equal to 9.7 % stroke rate/year from a score of 6 (HTN, DM, Vascular, Age, TE (2)). He denies any evidence of active bleeding. Continue Xarelto for anticoagulation.  - will continue Atenolol 25mg  daily for rate-control.  2. CAD - s/p BMS to 2nd  OM in 2012, low-risk NST in 12/2012.  - he denies any recurrent chest pain - Continue beta blocker and statin therapy. No ASA secondary to need for Xarelto.  3. Chronic diastolic CHF - echo in 1031 showed a preserved EF of 55-60%. He has trace lower extremity edema on examination but lungs are clear and weight has been stable on his home scales. Minimal edema on exam. Looks stable to me.  - continue current Lasix dosing of 80mg  BID. Creatinine was stable at 1.13 when recently checked on 09/15/2016.  4. HTN - BP is well-controlled  - continue Atenolol 25mg  daily, Losartan 25mg  daily, and Spironolactone 25mg  daily.  5. HLD - followed by PCP. LDL 52.  - continue Atorvastatin 10mg  daily.   6. Thoracic aortic aneurysm  - stable at 5.0 cm by recent CT Scan. - followed by Dr. Servando Snare with CT Surgery.   7. Chronic respiratory failure due to morbid obesity and OSA. Followed by pulmonary. Oxygen level good today.      Signed, Peter  Martinique, [place] 12/22/2016 9:41 AM    [facility] [address] [place], [place] [address] Phone: [phone]

## 2016-12-21 ENCOUNTER — Telehealth: Payer: Self-pay | Admitting: Adult Health

## 2016-12-21 MED ORDER — FLUTICASONE PROPIONATE 50 MCG/ACT NA SUSP: 2.0000 | Freq: Every day | NASAL | 1 refills | Status: DC

## 2016-12-21 NOTE — Telephone Encounter (Signed)
Received fax [facility] requesting Rx for Flonase be changed to 90 day supply Flonase rx'd per the 4.4.18 phone note >> the Dymista from the 3.8.18 was changed to Flonase and Astelin per insurance coverage  Last ov 6.20.18 w/ VS, follow up in 6 months >> recall placed for December w/ VS Rx sent to pharmacy Nothing further needed; will sign off

## 2016-12-22 ENCOUNTER — Ambulatory Visit (INDEPENDENT_AMBULATORY_CARE_PROVIDER_SITE_OTHER): Payer: Medicare Other | Admitting: Cardiology

## 2016-12-22 ENCOUNTER — Encounter: Payer: Self-pay | Admitting: Cardiology

## 2016-12-22 VITALS — BP 92/58 | HR 60 | Ht 66.0 in | Wt 268.8 lb

## 2016-12-22 DIAGNOSIS — I2510 Atherosclerotic heart disease of native coronary artery without angina pectoris: Secondary | ICD-10-CM

## 2016-12-22 DIAGNOSIS — I480 Paroxysmal atrial fibrillation: Secondary | ICD-10-CM | POA: Diagnosis not present

## 2016-12-22 DIAGNOSIS — I5032 Chronic diastolic (congestive) heart failure: Secondary | ICD-10-CM

## 2016-12-22 NOTE — Patient Instructions (Signed)
Continue your current therapy  Restrict your salt intake  I will see you in 6 months.   

## 2017-01-16 ENCOUNTER — Other Ambulatory Visit: Payer: Self-pay | Admitting: Cardiology

## 2017-01-24 DIAGNOSIS — G4730 Sleep apnea, unspecified: Secondary | ICD-10-CM | POA: Diagnosis not present

## 2017-01-24 DIAGNOSIS — E1165 Type 2 diabetes mellitus with hyperglycemia: Secondary | ICD-10-CM | POA: Diagnosis not present

## 2017-01-24 DIAGNOSIS — N400 Enlarged prostate without lower urinary tract symptoms: Secondary | ICD-10-CM | POA: Diagnosis not present

## 2017-01-24 DIAGNOSIS — E559 Vitamin D deficiency, unspecified: Secondary | ICD-10-CM | POA: Diagnosis not present

## 2017-01-24 DIAGNOSIS — I2510 Atherosclerotic heart disease of native coronary artery without angina pectoris: Secondary | ICD-10-CM | POA: Diagnosis not present

## 2017-01-24 DIAGNOSIS — E119 Type 2 diabetes mellitus without complications: Secondary | ICD-10-CM | POA: Diagnosis not present

## 2017-01-24 DIAGNOSIS — I480 Paroxysmal atrial fibrillation: Secondary | ICD-10-CM | POA: Diagnosis not present

## 2017-01-24 DIAGNOSIS — I509 Heart failure, unspecified: Secondary | ICD-10-CM | POA: Diagnosis not present

## 2017-01-24 DIAGNOSIS — C61 Malignant neoplasm of prostate: Secondary | ICD-10-CM | POA: Diagnosis not present

## 2017-01-24 DIAGNOSIS — I10 Essential (primary) hypertension: Secondary | ICD-10-CM | POA: Diagnosis not present

## 2017-01-25 ENCOUNTER — Other Ambulatory Visit: Payer: Self-pay | Admitting: Cardiology

## 2017-01-29 DIAGNOSIS — M48062 Spinal stenosis, lumbar region with neurogenic claudication: Secondary | ICD-10-CM | POA: Diagnosis not present

## 2017-01-31 DIAGNOSIS — L308 Other specified dermatitis: Secondary | ICD-10-CM | POA: Diagnosis not present

## 2017-02-26 DIAGNOSIS — Z923 Personal history of irradiation: Secondary | ICD-10-CM | POA: Diagnosis not present

## 2017-02-26 DIAGNOSIS — C61 Malignant neoplasm of prostate: Secondary | ICD-10-CM | POA: Diagnosis not present

## 2017-02-27 ENCOUNTER — Other Ambulatory Visit: Payer: Self-pay

## 2017-02-27 ENCOUNTER — Telehealth: Payer: Self-pay

## 2017-02-27 MED ORDER — METOPROLOL TARTRATE 25 MG PO TABS: ORAL_TABLET | ORAL | 3 refills | Status: DC

## 2017-02-27 NOTE — Telephone Encounter (Signed)
Received a call from patient.He stated he has been having sob and chest pressure since he last saw Dr.Jordan 12/22/16.Stated hard to walk from room to room,getting worse.No weight gain.No swelling.Stated he also has been dizzy.Spoke to [place] he advised to schedule appointment.Appointment scheduled with Dr.Jordan 03/01/17 at 11:40 am.

## 2017-03-01 ENCOUNTER — Telehealth (HOSPITAL_COMMUNITY): Payer: Self-pay

## 2017-03-01 ENCOUNTER — Encounter: Payer: Self-pay | Admitting: Adult Health

## 2017-03-01 ENCOUNTER — Ambulatory Visit (INDEPENDENT_AMBULATORY_CARE_PROVIDER_SITE_OTHER): Payer: Medicare Other | Admitting: Adult Health

## 2017-03-01 ENCOUNTER — Encounter: Payer: Self-pay | Admitting: Cardiology

## 2017-03-01 ENCOUNTER — Ambulatory Visit: Payer: Medicare Other | Admitting: Cardiology

## 2017-03-01 VITALS — BP 102/56 | HR 57 | Ht 67.0 in | Wt 271.0 lb

## 2017-03-01 DIAGNOSIS — I10 Essential (primary) hypertension: Secondary | ICD-10-CM | POA: Diagnosis not present

## 2017-03-01 DIAGNOSIS — I2510 Atherosclerotic heart disease of native coronary artery without angina pectoris: Secondary | ICD-10-CM

## 2017-03-01 DIAGNOSIS — I517 Cardiomegaly: Secondary | ICD-10-CM | POA: Diagnosis not present

## 2017-03-01 DIAGNOSIS — R0600 Dyspnea, unspecified: Secondary | ICD-10-CM | POA: Diagnosis not present

## 2017-03-01 MED ORDER — METOPROLOL TARTRATE 25 MG PO TABS: ORAL_TABLET | ORAL | 3 refills | Status: DC

## 2017-03-01 NOTE — Progress Notes (Signed)
Cardiology Office Note   Date:  03/01/2017   ID:  Carlos Dawson, DOB 1940-07-15, MRN [id number]  PCP:  Josetta Huddle, MD  Cardiologist:  Dr. Martinique  Chief Complaint  Patient presents with  . Follow-up    Pt c/o SOB--constant lately on mild exertion; CP--has been regular, happens randomly whether laying down, sitting, or walking--feels like someone is sitting on his chest--no pain, just pressure; c/o dizziness when he stands up; cramping in legs, arms, and one occasion of chest cramping on left side--potassium helped  . Shortness of Breath     History of Present Illness: Carlos Dawson is a 77 y.o. male who presents for ongoing assessment and management of CAD,(s/p BMS to 2nd OM in 2012, low-risk NST in 12/2012), PAF (on Xarelto), chronic diastolic CHF, prior PE, HTN, HLD, Type 2 DM, and known thoracic aortic aneurysm He also has recent back surgery with improvement in symptoms of fatigue and pain in his thighs.  He was last seen by Dr. Martinique on 12/22/2017, he was continued on beta-blocker, statin therapy, Lasix, ARB.  He called our office 02/27/2017 complaining of worsening shortness of breath and chest pressure, he stated that it was hard to walk from room to room without getting short of breath.  Patient states that his dyspnea has worsened over the last 3 months. He states that during minimal activity will cause him to stop and rest that he can breathe. Inhalers which are helpful, resting normalizes his breathing status. Ending over to pick something up causes worsening breathing.  He has an O2 saturation monitor at home, when he is feeling as if he is scheduled for breath of oxygen 95%. Also is complaining of cramps in his hands and legs. He took a dose of potassium which did help.   Past Medical History:  Diagnosis Date  . Anticoagulant long-term use   . Aortic root enlargement ([place])   . Ascending aortic aneurysm [facility])    recent scan in October 2012 showing no change; followed by  Dr. Servando Snare  . ASCVD (arteriosclerotic cardiovascular disease)    Prior BMS to the 2nd OM in September 2012; with repeat cath in October showing patency  . CAD (coronary artery disease)    a. s/p BMS to 2nd OM in Sept 2012; b. LexiScan Myoview (12/2012):  Inf infarct; bowel and motion artifact make study difficult to interpret; no ischemia; not gated; Low Risk  . CHF (congestive heart failure) ([place])    no recent issues 10/13/14  . Colonic polyp   . Contact lens/glasses fitting   . Diabetes mellitus without complication (HCC)    metphormin, average 154 dx 2017  . Diastolic dysfunction   . Generalized headaches    neck stenosis  . GERD (gastroesophageal reflux disease)   . Hearing loss   . Hearing loss    more so on left  . Hemorrhoids   . Hypertension   . IBS (irritable bowel syndrome)   . LVH (left ventricular hypertrophy)   . Mild intermittent asthma   . OA (osteoarthritis)   . Obesities, morbid ([place])   . OSA (obstructive sleep apnea)    PSG 03/30/97 AHI 21, BPAP 13/9  . PAF (paroxysmal atrial fibrillation) ([place])    a. on Xarelto b. s/p DCCV in 08/2016  . Pneumonia   . Prostate CA [facility])    Oncologist  DR. Daralene [facility] 09/24/14, undetermined tx   prostate  . Pulmonary embolism ([place])    2008  . Sleep  apnea   . SOB (shortness of breath)    on excertion  . Thoracic aortic aneurysm (HCC)    Aortic Size Index=     5.0    /Body surface area is 2.43 meters squared. = 2.05  < 2.75 cm/m2      4% risk per year 2.75 to 4.25          8% risk per year > 4.25 cm/m2    20% risk per year   Stable aneurysmal dilation of the ascending aorta with maximum AP diameter of 4.8 cm. Stable area of narrowing of the proximal most portion of the descending aorta measuring 2 cm., previously identified as an area of coarctation. No evidence of aortic dissection.  Coronary artery disease.  Normal appearance of the lungs.   Electronically Signed   By: Fidela Salisbury M.D.   On: 10/01/2014 08:50       Past Surgical History:  Procedure Laterality Date  . ACHILLES TENDON REPAIR    . APPENDECTOMY    . BACK SURGERY    . CARDIAC CATHETERIZATION  2006  . CARDIAC CATHETERIZATION  October 2012   Stent patent  . CARPAL TUNNEL RELEASE     LEFT  . CERVICAL SPINE SURGERY  06/02/2010   lower back and neck  . CHOLECYSTECTOMY    . COLONOSCOPY WITH PROPOFOL N/A 12/29/2014   Procedure: COLONOSCOPY WITH PROPOFOL;  Surgeon: Garlan Fair, MD;  Location: [facility] ENDOSCOPY;  Service: Endoscopy;  Laterality: N/A;  . CORONARY STENT PLACEMENT  Sept 2012   2nd OM with BMS  . EYE SURGERY     bilateral cataract  . HEMORROIDECTOMY    . LAMINECTOMY  05/30/2012   L 4 L5  . LAMINECTOMY WITH POSTERIOR LATERAL ARTHRODESIS LEVEL 3 N/A 10/18/2016   Procedure: Posterior Lateral Fusion - Lumbar One-Four, segmental instrumentation Lumbar One-Five,  decompression,;  Surgeon: Eustace Moore, MD;  Location: [facility] OR;  Service: Neurosurgery;  Laterality: N/A;  . LAPAROSCOPIC GASTRIC BANDING    . LEFT AND RIGHT HEART CATHETERIZATION WITH CORONARY ANGIOGRAM N/A 05/07/2014   Procedure: LEFT AND RIGHT HEART CATHETERIZATION WITH CORONARY ANGIOGRAM;  Surgeon: [name] Martinique, MD;  Location: [facility] CATH LAB;  Service: Cardiovascular;  Laterality: N/A;  . LUMBAR LAMINECTOMY/DECOMPRESSION MICRODISCECTOMY N/A 05/04/2016   Procedure: Laminectomy and Foraminotomy - Thoracic twelve-Lumbar one -Posterior Fusion Lumbar one-two;  Surgeon: Eustace Moore, MD;  Location: [facility];  Service: Neurosurgery;  Laterality: N/A;  . POSTERIOR LUMBAR FUSION  10/18/2016  . ROTATOR CUFF REPAIR     both  . TONSILLECTOMY    . TRIGGER FINGER RELEASE     LEFT  . UVULOPALATOPHARYNGOPLASTY    . VASECTOMY       Current Outpatient Medications  Medication Sig Dispense Refill  . acetaminophen (TYLENOL) 500 MG tablet Take 1,000 mg by mouth every 8 (eight) hours as needed for mild pain.     Marland Kitchen albuterol (PROVENTIL HFA;VENTOLIN HFA) 108 (90 Base) MCG/ACT inhaler  Inhale 2 puffs into the lungs every 4 (four) hours as needed for wheezing or shortness of breath.    Marland Kitchen atorvastatin (LIPITOR) 10 MG tablet Take 10 mg by mouth daily at 6 PM.     . budesonide-formoterol (SYMBICORT) 160-4.5 MCG/ACT inhaler Inhale 2 puffs into the lungs 2 (two) times daily as needed (for shortness of breath).     . celecoxib (CELEBREX) 200 MG capsule Take 200 mg by mouth 2 (two) times daily.    . Cholecalciferol (VITAMIN D-3) 5000  units TABS Take 5,000 Units by mouth daily.    Marland Kitchen dicyclomine (BENTYL) 10 MG capsule Take 1 capsule by mouth 2 (two) times daily as needed.    . fluticasone (FLONASE) 50 MCG/ACT nasal spray Place 2 sprays into both nostrils daily. 48 g 1  . furosemide (LASIX) 40 MG tablet Take 80 mg by mouth 2 (two) times daily.    Marland Kitchen KLOR-CON M20 20 MEQ tablet TAKE 2 TABLETS BY MOUTH TWICE A DAY 360 tablet 1  . losartan (COZAAR) 25 MG tablet TAKE 1 TABLET BY MOUTH EVERY DAY 90 tablet 1  . methocarbamol (ROBAXIN) 500 MG tablet Take 1 tablet (500 mg total) by mouth every 8 (eight) hours as needed for muscle spasms. 60 tablet 1  . metoprolol tartrate (LOPRESSOR) 25 MG tablet TAKE 1 TABLET BY MOUTH TWICE A DAY.TAKE EXTRA 1/2 TAB IF SBP>110 180 tablet 3  . montelukast (SINGULAIR) 10 MG tablet Take 10 mg by mouth at bedtime.    . nitroGLYCERIN (NITROSTAT) 0.4 MG SL tablet Place 1 tablet (0.4 mg total) under the tongue every 5 (five) minutes as needed for chest pain. 25 tablet 5  . pantoprazole (PROTONIX) 40 MG tablet Take 1 tablet (40 mg total) by mouth daily. 30 tablet 11  . Polyethyl Glycol-Propyl Glycol (SYSTANE OP) Place 1 drop into both eyes as needed (for dry eyes).    . psyllium (METAMUCIL) 58.6 % packet Take 1 packet by mouth daily.    . rivaroxaban (XARELTO) 20 MG TABS tablet Take 20 mg by mouth daily with supper.     . sildenafil (VIAGRA) 100 MG tablet Take 100 mg by mouth as needed for erectile dysfunction.     . Skin Protectants, Misc. (EUCERIN) cream Apply 1  application topically as needed for dry skin.    . tamsulosin (FLOMAX) 0.4 MG CAPS Take 0.4 mg by mouth every evening.     . topiramate (TOPAMAX) 25 MG capsule Take 50 mg by mouth 2 (two) times daily.     . traMADol (ULTRAM) 50 MG tablet Take 50 mg by mouth every 12 (twelve) hours as needed for moderate pain.      No current facility-administered medications for this visit.     Allergies:   Adhesive [tape]; Latex; and Morphine    Social History:  The patient  reports that he quit smoking about 25 years ago. He has a 45.00 pack-year smoking history. he has never used smokeless tobacco. He reports that he does not drink alcohol or use drugs.   Family History:  The patient's family history includes Diabetes in his mother; Emphysema (age of onset: 64) in his father; Heart attack in his sister; Heart disease in his mother; Other in his mother.    ROS: All other systems are reviewed and negative. Unless otherwise mentioned in H&P    PHYSICAL EXAM: VS:  BP (!) 102/56 (BP Location: Left Arm, Patient Position: Sitting, Cuff Size: Large)   Pulse (!) 57   Ht 5\' 7"  (1.702 m)   Wt 271 lb (122.9 kg)   BMI 42.44 kg/m  , BMI Body mass index is 42.44 kg/m. GEN: Well nourished, well developed, in no acute distress  HEENT: normal  Neck: no JVD, carotid bruits, or masses Cardiac: RRR; no murmurs, rubs, or gallops,no edema  Respiratory:  clear to auscultation bilaterally, normal work of breathing GI: soft, nontender, nondistended, + BS. Obese  MS: no deformity or atrophy  Skin: warm and dry, no rash Neuro:  Strength and  sensation are intact Psych: euthymic mood, full affect   EKG: Sinus bradycardia heart rate 57 bpm, first-degree AV block with PR interval 0.234 ms QT interval is normal at 432 ms, occasional PACs. (Unchanged from prior EKG on 10/13/2016).  Recent Labs: 03/15/2016: ALT 19 09/14/2016: B Natriuretic Peptide 476.3; Magnesium 2.0 10/16/2016: BUN 18; Creatinine, Ser 1.19; Hemoglobin  12.3; Platelets 162; Potassium 3.6; Sodium 141    Lipid Panel    Component Value Date/Time   CHOL 118 01/07/2013 0410   TRIG 133 01/07/2013 0410   HDL 40 01/07/2013 0410   CHOLHDL 3.0 01/07/2013 0410   VLDL 27 01/07/2013 0410   LDLCALC 51 01/07/2013 0410      Wt Readings from Last 3 Encounters:  03/01/17 271 lb (122.9 kg)  12/22/16 268 lb 12.8 oz (121.9 kg)  11/16/16 262 lb (118.8 kg)      Other studies Reviewed: Cardiac Cath 05/07/2014 Procedure: Right Heart Cath, Left Heart Cath, Selective Coronary Angiography  Indication: 77 yo WM with obesity, diastolic CHF, Afib and prior stenting of the OM1 presents with increased dyspnea on exertion and chest pain.       Procedural Findings: Hemodynamics RA 16/17 mean 14 mm Hg RV 47/12/17 mm Hg PA 48/15 mean 31 mm Hg PCWP 25/24 mean 22 mm Hg LV N/A AO 103/63 mean 80 mm Hg  Oxygen saturations: PA 61% AO 91%  Cardiac Output (Fick) 5.7 L/min  Cardiac Index (Fick) 2.47 L/min/meter squared.          Coronary angiography: Coronary dominance: left  Left mainstem: The left main is a shared ostia for the LAD and LCx. It is normal.  Left anterior descending (LAD): The LAD is a relatively small branch giving off one diagonal branch. It is normal.  Left circumflex (LCx): The LCx is a large dominant vessel without significant disease. The stented segment of the first OM is widely patent.  Right coronary artery (RCA): The RCA is small and nondominant with a large conus branch. It is normal.   Left ventriculography: Not done  Final Conclusions:   1. No obstructive CAD. The OM stent is widely patent.  2. Mild pulmonary HTN with elevated LV filling pressures.   Recommendations: will increase diuretic therapy. Otherwise continue medication. Resume anticoagulant therapy.        ASSESSMENT AND PLAN:  1.  Dyspnea: Worsening, minimal exertion, sometimes help with inhalers, sometimes helped with rest. The patient does have  morbid obesity, and pulmonary hypertension. He may need to be seen by pulmonologist for ongoing evaluation and management.  I will plan an echocardiogram for changes in LV function. If abnormal will plan for cardiac catheterization.  The patient may need to be changed from beta blocker in symptoms persist as well. Possibly changed to bisoprolol as tolerated in patients with breathing difficulties.   2. Coronary artery disease: Cardiac catheterization 2016, and OM. Will plan Lexiscan Myoview for reevaluation progression of CAD or new areas of ischemia. He will hold Lasix until morning of his stress test.  Ifthe patient's dyspnea becomes progressively worse from today. He is advised to come to the emergency room. May need to proceed with cardiac catheterization at that point as this may be ischemic equivalent. He states his breathing status was just like this prior to stent placement.  3. Hypercholesterolemia: Continue statin therapy. He has had recent labs drawn by primary care we are requesting copies.  Current medicines are reviewed at length with the patient today.    Labs/ tests  ordered today include: Echo, Lexiscan Myoview.   Phill Myron. West Pugh, ANP, AACC   03/01/2017 9:39 AM    [facility] [address], [place], [place] [address] Phone: [phone]; Fax: [phone]

## 2017-03-01 NOTE — Telephone Encounter (Signed)
Encounter complete. 

## 2017-03-01 NOTE — H&P (View-Only) (Signed)
Cardiology Office Note   Date:  03/01/2017   ID:  Carlos Dawson, DOB 08-Jul-1940, MRN [id number]  PCP:  Josetta Huddle, MD  Cardiologist:  Dr. Martinique  Chief Complaint  Patient presents with  . Follow-up    Pt c/o SOB--constant lately on mild exertion; CP--has been regular, happens randomly whether laying down, sitting, or walking--feels like someone is sitting on his chest--no pain, just pressure; c/o dizziness when he stands up; cramping in legs, arms, and one occasion of chest cramping on left side--potassium helped  . Shortness of Breath     History of Present Illness: Carlos Dawson is a 77 y.o. male who presents for ongoing assessment and management of CAD,(s/p BMS to 2nd OM in 2012, low-risk NST in 12/2012), PAF (on Xarelto), chronic diastolic CHF, prior PE, HTN, HLD, Type 2 DM, and known thoracic aortic aneurysm He also has recent back surgery with improvement in symptoms of fatigue and pain in his thighs.  He was last seen by Dr. Martinique on 12/22/2017, he was continued on beta-blocker, statin therapy, Lasix, ARB.  He called our office 02/27/2017 complaining of worsening shortness of breath and chest pressure, he stated that it was hard to walk from room to room without getting short of breath.  Patient states that his dyspnea has worsened over the last 3 months. He states that during minimal activity will cause him to stop and rest that he can breathe. Inhalers which are helpful, resting normalizes his breathing status. Ending over to pick something up causes worsening breathing.  He has an O2 saturation monitor at home, when he is feeling as if he is scheduled for breath of oxygen 95%. Also is complaining of cramps in his hands and legs. He took a dose of potassium which did help.   Past Medical History:  Diagnosis Date  . Anticoagulant long-term use   . Aortic root enlargement ([place])   . Ascending aortic aneurysm [facility])    recent scan in October 2012 showing no change; followed by  Dr. Servando Snare  . ASCVD (arteriosclerotic cardiovascular disease)    Prior BMS to the 2nd OM in September 2012; with repeat cath in October showing patency  . CAD (coronary artery disease)    a. s/p BMS to 2nd OM in Sept 2012; b. LexiScan Myoview (12/2012):  Inf infarct; bowel and motion artifact make study difficult to interpret; no ischemia; not gated; Low Risk  . CHF (congestive heart failure) ([place])    no recent issues 10/13/14  . Colonic polyp   . Contact lens/glasses fitting   . Diabetes mellitus without complication (HCC)    metphormin, average 154 dx 2017  . Diastolic dysfunction   . Generalized headaches    neck stenosis  . GERD (gastroesophageal reflux disease)   . Hearing loss   . Hearing loss    more so on left  . Hemorrhoids   . Hypertension   . IBS (irritable bowel syndrome)   . LVH (left ventricular hypertrophy)   . Mild intermittent asthma   . OA (osteoarthritis)   . Obesities, morbid ([place])   . OSA (obstructive sleep apnea)    PSG 03/30/97 AHI 21, BPAP 13/9  . PAF (paroxysmal atrial fibrillation) ([place])    a. on Xarelto b. s/p DCCV in 08/2016  . Pneumonia   . Prostate CA [facility])    Oncologist  DR. Daralene [facility] 09/24/14, undetermined tx   prostate  . Pulmonary embolism ([place])    2008  . Sleep  apnea   . SOB (shortness of breath)    on excertion  . Thoracic aortic aneurysm (HCC)    Aortic Size Index=     5.0    /Body surface area is 2.43 meters squared. = 2.05  < 2.75 cm/m2      4% risk per year 2.75 to 4.25          8% risk per year > 4.25 cm/m2    20% risk per year   Stable aneurysmal dilation of the ascending aorta with maximum AP diameter of 4.8 cm. Stable area of narrowing of the proximal most portion of the descending aorta measuring 2 cm., previously identified as an area of coarctation. No evidence of aortic dissection.  Coronary artery disease.  Normal appearance of the lungs.   Electronically Signed   By: Fidela Salisbury M.D.   On: 10/01/2014 08:50       Past Surgical History:  Procedure Laterality Date  . ACHILLES TENDON REPAIR    . APPENDECTOMY    . BACK SURGERY    . CARDIAC CATHETERIZATION  2006  . CARDIAC CATHETERIZATION  October 2012   Stent patent  . CARPAL TUNNEL RELEASE     LEFT  . CERVICAL SPINE SURGERY  06/02/2010   lower back and neck  . CHOLECYSTECTOMY    . COLONOSCOPY WITH PROPOFOL N/A 12/29/2014   Procedure: COLONOSCOPY WITH PROPOFOL;  Surgeon: Garlan Fair, MD;  Location: [facility];  Service: Endoscopy;  Laterality: N/A;  . CORONARY STENT PLACEMENT  Sept 2012   2nd OM with BMS  . EYE SURGERY     bilateral cataract  . HEMORROIDECTOMY    . LAMINECTOMY  05/30/2012   L 4 L5  . LAMINECTOMY WITH POSTERIOR LATERAL ARTHRODESIS LEVEL 3 N/A 10/18/2016   Procedure: Posterior Lateral Fusion - Lumbar One-Four, segmental instrumentation Lumbar One-Five,  decompression,;  Surgeon: Eustace Moore, MD;  Location: [facility] OR;  Service: Neurosurgery;  Laterality: N/A;  . LAPAROSCOPIC GASTRIC BANDING    . LEFT AND RIGHT HEART CATHETERIZATION WITH CORONARY ANGIOGRAM N/A 05/07/2014   Procedure: LEFT AND RIGHT HEART CATHETERIZATION WITH CORONARY ANGIOGRAM;  Surgeon: [name] Martinique, MD;  Location: [facility] CATH LAB;  Service: Cardiovascular;  Laterality: N/A;  . LUMBAR LAMINECTOMY/DECOMPRESSION MICRODISCECTOMY N/A 05/04/2016   Procedure: Laminectomy and Foraminotomy - Thoracic twelve-Lumbar one -Posterior Fusion Lumbar one-two;  Surgeon: Eustace Moore, MD;  Location: [facility];  Service: Neurosurgery;  Laterality: N/A;  . POSTERIOR LUMBAR FUSION  10/18/2016  . ROTATOR CUFF REPAIR     both  . TONSILLECTOMY    . TRIGGER FINGER RELEASE     LEFT  . UVULOPALATOPHARYNGOPLASTY    . VASECTOMY       Current Outpatient Medications  Medication Sig Dispense Refill  . acetaminophen (TYLENOL) 500 MG tablet Take 1,000 mg by mouth every 8 (eight) hours as needed for mild pain.     Marland Kitchen albuterol (PROVENTIL HFA;VENTOLIN HFA) 108 (90 Base) MCG/ACT inhaler  Inhale 2 puffs into the lungs every 4 (four) hours as needed for wheezing or shortness of breath.    Marland Kitchen atorvastatin (LIPITOR) 10 MG tablet Take 10 mg by mouth daily at 6 PM.     . budesonide-formoterol (SYMBICORT) 160-4.5 MCG/ACT inhaler Inhale 2 puffs into the lungs 2 (two) times daily as needed (for shortness of breath).     . celecoxib (CELEBREX) 200 MG capsule Take 200 mg by mouth 2 (two) times daily.    . Cholecalciferol (VITAMIN D-3) 5000  units TABS Take 5,000 Units by mouth daily.    Marland Kitchen dicyclomine (BENTYL) 10 MG capsule Take 1 capsule by mouth 2 (two) times daily as needed.    . fluticasone (FLONASE) 50 MCG/ACT nasal spray Place 2 sprays into both nostrils daily. 48 g 1  . furosemide (LASIX) 40 MG tablet Take 80 mg by mouth 2 (two) times daily.    Marland Kitchen KLOR-CON M20 20 MEQ tablet TAKE 2 TABLETS BY MOUTH TWICE A DAY 360 tablet 1  . losartan (COZAAR) 25 MG tablet TAKE 1 TABLET BY MOUTH EVERY DAY 90 tablet 1  . methocarbamol (ROBAXIN) 500 MG tablet Take 1 tablet (500 mg total) by mouth every 8 (eight) hours as needed for muscle spasms. 60 tablet 1  . metoprolol tartrate (LOPRESSOR) 25 MG tablet TAKE 1 TABLET BY MOUTH TWICE A DAY.TAKE EXTRA 1/2 TAB IF SBP>110 180 tablet 3  . montelukast (SINGULAIR) 10 MG tablet Take 10 mg by mouth at bedtime.    . nitroGLYCERIN (NITROSTAT) 0.4 MG SL tablet Place 1 tablet (0.4 mg total) under the tongue every 5 (five) minutes as needed for chest pain. 25 tablet 5  . pantoprazole (PROTONIX) 40 MG tablet Take 1 tablet (40 mg total) by mouth daily. 30 tablet 11  . Polyethyl Glycol-Propyl Glycol (SYSTANE OP) Place 1 drop into both eyes as needed (for dry eyes).    . psyllium (METAMUCIL) 58.6 % packet Take 1 packet by mouth daily.    . rivaroxaban (XARELTO) 20 MG TABS tablet Take 20 mg by mouth daily with supper.     . sildenafil (VIAGRA) 100 MG tablet Take 100 mg by mouth as needed for erectile dysfunction.     . Skin Protectants, Misc. (EUCERIN) cream Apply 1  application topically as needed for dry skin.    . tamsulosin (FLOMAX) 0.4 MG CAPS Take 0.4 mg by mouth every evening.     . topiramate (TOPAMAX) 25 MG capsule Take 50 mg by mouth 2 (two) times daily.     . traMADol (ULTRAM) 50 MG tablet Take 50 mg by mouth every 12 (twelve) hours as needed for moderate pain.      No current facility-administered medications for this visit.     Allergies:   Adhesive [tape]; Latex; and Morphine    Social History:  The patient  reports that he quit smoking about 25 years ago. He has a 45.00 pack-year smoking history. he has never used smokeless tobacco. He reports that he does not drink alcohol or use drugs.   Family History:  The patient's family history includes Diabetes in his mother; Emphysema (age of onset: 51) in his father; Heart attack in his sister; Heart disease in his mother; Other in his mother.    ROS: All other systems are reviewed and negative. Unless otherwise mentioned in H&P    PHYSICAL EXAM: VS:  BP (!) 102/56 (BP Location: Left Arm, Patient Position: Sitting, Cuff Size: Large)   Pulse (!) 57   Ht 5\' 7"  (1.702 m)   Wt 271 lb (122.9 kg)   BMI 42.44 kg/m  , BMI Body mass index is 42.44 kg/m. GEN: Well nourished, well developed, in no acute distress  HEENT: normal  Neck: no JVD, carotid bruits, or masses Cardiac: RRR; no murmurs, rubs, or gallops,no edema  Respiratory:  clear to auscultation bilaterally, normal work of breathing GI: soft, nontender, nondistended, + BS. Obese  MS: no deformity or atrophy  Skin: warm and dry, no rash Neuro:  Strength and  sensation are intact Psych: euthymic mood, full affect   EKG: Sinus bradycardia heart rate 57 bpm, first-degree AV block with PR interval 0.234 ms QT interval is normal at 432 ms, occasional PACs. (Unchanged from prior EKG on 10/13/2016).  Recent Labs: 03/15/2016: ALT 19 09/14/2016: B Natriuretic Peptide 476.3; Magnesium 2.0 10/16/2016: BUN 18; Creatinine, Ser 1.19; Hemoglobin  12.3; Platelets 162; Potassium 3.6; Sodium 141    Lipid Panel    Component Value Date/Time   CHOL 118 01/07/2013 0410   TRIG 133 01/07/2013 0410   HDL 40 01/07/2013 0410   CHOLHDL 3.0 01/07/2013 0410   VLDL 27 01/07/2013 0410   LDLCALC 51 01/07/2013 0410      Wt Readings from Last 3 Encounters:  03/01/17 271 lb (122.9 kg)  12/22/16 268 lb 12.8 oz (121.9 kg)  11/16/16 262 lb (118.8 kg)      Other studies Reviewed: Cardiac Cath 05/07/2014 Procedure: Right Heart Cath, Left Heart Cath, Selective Coronary Angiography  Indication: 76 yo WM with obesity, diastolic CHF, Afib and prior stenting of the OM1 presents with increased dyspnea on exertion and chest pain.       Procedural Findings: Hemodynamics RA 16/17 mean 14 mm Hg RV 47/12/17 mm Hg PA 48/15 mean 31 mm Hg PCWP 25/24 mean 22 mm Hg LV N/A AO 103/63 mean 80 mm Hg  Oxygen saturations: PA 61% AO 91%  Cardiac Output (Fick) 5.7 L/min  Cardiac Index (Fick) 2.47 L/min/meter squared.          Coronary angiography: Coronary dominance: left  Left mainstem: The left main is a shared ostia for the LAD and LCx. It is normal.  Left anterior descending (LAD): The LAD is a relatively small branch giving off one diagonal branch. It is normal.  Left circumflex (LCx): The LCx is a large dominant vessel without significant disease. The stented segment of the first OM is widely patent.  Right coronary artery (RCA): The RCA is small and nondominant with a large conus branch. It is normal.   Left ventriculography: Not done  Final Conclusions:   1. No obstructive CAD. The OM stent is widely patent.  2. Mild pulmonary HTN with elevated LV filling pressures.   Recommendations: will increase diuretic therapy. Otherwise continue medication. Resume anticoagulant therapy.        ASSESSMENT AND PLAN:  1.  Dyspnea: Worsening, minimal exertion, sometimes help with inhalers, sometimes helped with rest. The patient does have  morbid obesity, and pulmonary hypertension. He may need to be seen by pulmonologist for ongoing evaluation and management.  I will plan an echocardiogram for changes in LV function. If abnormal will plan for cardiac catheterization.  The patient may need to be changed from beta blocker in symptoms persist as well. Possibly changed to bisoprolol as tolerated in patients with breathing difficulties.   2. Coronary artery disease: Cardiac catheterization 2016, and OM. Will plan Lexiscan Myoview for reevaluation progression of CAD or new areas of ischemia. He will hold Lasix until morning of his stress test.  Ifthe patient's dyspnea becomes progressively worse from today. He is advised to come to the emergency room. May need to proceed with cardiac catheterization at that point as this may be ischemic equivalent. He states his breathing status was just like this prior to stent placement.  3. Hypercholesterolemia: Continue statin therapy. He has had recent labs drawn by primary care we are requesting copies.  Current medicines are reviewed at length with the patient today.    Labs/ tests  ordered today include: Echo, Lexiscan Myoview.   Phill Myron. [place] Pugh, ANP, AACC   03/01/2017 9:39 AM    [facility] [address] [place], [place] [address] Phone: [phone]; Fax: [phone]

## 2017-03-01 NOTE — Patient Instructions (Signed)
Medication Instructions:  NO CHANGES-Your physician recommends that you continue on your current medications as directed. Please refer to the Current Medication list given to you today.,  If you need a refill on your cardiac medications before your next appointment, please call your pharmacy.  Labwork: PLEASE HAVE DR Eliezer Champagne RECENT LAB RESULTS TO [phone]  Testing/Procedures: Echocardiogram - Your physician has requested that you have an echocardiogram. Echocardiography is a painless test that uses sound waves to create images of your heart. It provides your doctor with information about the size and shape of your heart and how well your heart's chambers and valves are working. This procedure takes approximately one hour. There are no restrictions for this procedure. This will be performed at our [facility] location - [address], [address].  Your physician has requested that you have a lexiscan myoview. For further information please visit [email or web address]. Please follow instruction sheet, as given.  Follow-Up: Your physician wants you to follow-up: AFTER TESTING WITH DR Martinique -OR- Jory Sims, DNP.    Thank you for choosing [facility] at [facility]!!

## 2017-03-06 ENCOUNTER — Ambulatory Visit (HOSPITAL_COMMUNITY)
Admission: RE | Admit: 2017-03-06 | Discharge: 2017-03-06 | Disposition: A | Discharge status: Home / Self Care | Payer: Medicare Other | Source: Ambulatory Visit | Attending: Internal Medicine | Admitting: Internal Medicine

## 2017-03-06 DIAGNOSIS — I2510 Atherosclerotic heart disease of native coronary artery without angina pectoris: Secondary | ICD-10-CM | POA: Diagnosis not present

## 2017-03-06 DIAGNOSIS — I4891 Unspecified atrial fibrillation: Secondary | ICD-10-CM | POA: Diagnosis not present

## 2017-03-06 DIAGNOSIS — I509 Heart failure, unspecified: Secondary | ICD-10-CM | POA: Insufficient documentation

## 2017-03-06 DIAGNOSIS — J45909 Unspecified asthma, uncomplicated: Secondary | ICD-10-CM | POA: Diagnosis not present

## 2017-03-06 DIAGNOSIS — R0600 Dyspnea, unspecified: Secondary | ICD-10-CM | POA: Diagnosis not present

## 2017-03-06 DIAGNOSIS — I110 Hypertensive heart disease with heart failure: Secondary | ICD-10-CM | POA: Diagnosis not present

## 2017-03-06 DIAGNOSIS — R931 Abnormal findings on diagnostic imaging of heart and coronary circulation: Secondary | ICD-10-CM | POA: Diagnosis not present

## 2017-03-06 DIAGNOSIS — I480 Paroxysmal atrial fibrillation: Secondary | ICD-10-CM | POA: Insufficient documentation

## 2017-03-06 DIAGNOSIS — I712 Thoracic aortic aneurysm, without rupture: Secondary | ICD-10-CM | POA: Insufficient documentation

## 2017-03-06 DIAGNOSIS — I517 Cardiomegaly: Secondary | ICD-10-CM | POA: Diagnosis not present

## 2017-03-06 DIAGNOSIS — I10 Essential (primary) hypertension: Secondary | ICD-10-CM | POA: Diagnosis not present

## 2017-03-06 LAB — MYOCARDIAL PERFUSION IMAGING
LV dias vol: 133 mL (ref 62–150)
LV sys vol: 52 mL
Peak HR: 66 {beats}/min
Rest HR: 51 {beats}/min
SDS: 2
SRS: 3
SSS: 5
TID: 1.27

## 2017-03-06 MED ORDER — TECHNETIUM TC 99M TETROFOSMIN IV KIT
10.3000 | PACK | Freq: Once | INTRAVENOUS | Status: AC | PRN
  Administered 2017-03-06: 10.3 via INTRAVENOUS
  Filled 2017-03-06: qty 11

## 2017-03-06 MED ORDER — REGADENOSON 0.4 MG/5ML IV SOLN
0.4000 mg | Freq: Once | INTRAVENOUS | Status: AC
  Administered 2017-03-06: 0.4 mg via INTRAVENOUS

## 2017-03-06 MED ORDER — TECHNETIUM TC 99M TETROFOSMIN IV KIT
30.6000 | PACK | Freq: Once | INTRAVENOUS | Status: AC | PRN
  Administered 2017-03-06: 30.6 via INTRAVENOUS
  Filled 2017-03-06: qty 31

## 2017-03-07 ENCOUNTER — Telehealth: Payer: Self-pay

## 2017-03-07 ENCOUNTER — Other Ambulatory Visit: Payer: Self-pay

## 2017-03-07 ENCOUNTER — Ambulatory Visit (HOSPITAL_COMMUNITY): Payer: Medicare Other | Attending: Cardiovascular Disease

## 2017-03-07 DIAGNOSIS — I517 Cardiomegaly: Secondary | ICD-10-CM | POA: Diagnosis not present

## 2017-03-07 DIAGNOSIS — I10 Essential (primary) hypertension: Secondary | ICD-10-CM | POA: Diagnosis not present

## 2017-03-07 DIAGNOSIS — I2510 Atherosclerotic heart disease of native coronary artery without angina pectoris: Secondary | ICD-10-CM

## 2017-03-07 DIAGNOSIS — I060 Rheumatic aortic stenosis: Secondary | ICD-10-CM | POA: Insufficient documentation

## 2017-03-07 DIAGNOSIS — I420 Dilated cardiomyopathy: Secondary | ICD-10-CM | POA: Diagnosis not present

## 2017-03-07 DIAGNOSIS — R0600 Dyspnea, unspecified: Secondary | ICD-10-CM | POA: Diagnosis not present

## 2017-03-07 DIAGNOSIS — I5032 Chronic diastolic (congestive) heart failure: Secondary | ICD-10-CM

## 2017-03-07 NOTE — Telephone Encounter (Signed)
@  [facility] [address] [place] [address] Dept: [phone] Loc: [phone]  NAVEN GIAMBALVO  03/07/2017  You are scheduled for a Cardiac Cath Tuesday 03/13/17 on  with Dr.Jordan.  1. Please arrive at the [facility] (Main Entrance A) at [facility]: [address] [place], [place] [address] at 8:00 am. (two hours before your procedure to ensure your preparation). Free valet parking service is available.   Special note: Every effort is made to have your procedure done on time. Please understand that emergencies sometimes delay scheduled procedures.  2. Diet: Nothing to eat or drink after midnight Monday 03/12/17.  3. Labs: to be done 03/08/17 ( bmet,cbc,pt )  4. Medication instructions in preparation for your procedure:    HOLD XARELTO 2 DAYS BEFORE CATH     HOLD LASIX MORNING OF CATH     On the morning of your procedure, take a 81 mg ASPIRIN and any morning medicines NOT listed above.  You may use sips of water.  5. Plan for one night stay--bring personal belongings. 6. Bring a current list of your medications and current insurance cards. 7. You MUST have a responsible person to drive you home. 8. Someone MUST be with you the first 24 hours after you arrive home or your discharge will be delayed. 9. Please wear clothes that are easy to get on and off and wear slip-on shoes.  Thank you for allowing [place] to care for you!   -- [facility]

## 2017-03-08 DIAGNOSIS — I5032 Chronic diastolic (congestive) heart failure: Secondary | ICD-10-CM | POA: Diagnosis not present

## 2017-03-08 DIAGNOSIS — I2510 Atherosclerotic heart disease of native coronary artery without angina pectoris: Secondary | ICD-10-CM | POA: Diagnosis not present

## 2017-03-08 LAB — PT AND PTT
INR: 1.3 — ABNORMAL HIGH (ref 0.8–1.2)
Prothrombin Time: 13.7 s — ABNORMAL HIGH (ref 9.1–12.0)
aPTT: 48 s — ABNORMAL HIGH (ref 24–33)

## 2017-03-08 LAB — BASIC METABOLIC PANEL
BUN/Creatinine Ratio: 22 (ref 10–24)
BUN: 27 mg/dL (ref 8–27)
CO2: 22 mmol/L (ref 20–29)
Calcium: 8.6 mg/dL (ref 8.6–10.2)
Chloride: 106 mmol/L (ref 96–106)
Creatinine, Ser: 1.21 mg/dL (ref 0.76–1.27)
GFR calc Af Amer: 67 mL/min/{1.73_m2} (ref >59)
GFR calc non Af Amer: 58 mL/min/{1.73_m2} — ABNORMAL LOW (ref >59)
Glucose: 164 mg/dL — ABNORMAL HIGH (ref 65–99)
Potassium: 4.7 mmol/L (ref 3.5–5.2)
Sodium: 140 mmol/L (ref 134–144)

## 2017-03-09 ENCOUNTER — Other Ambulatory Visit: Payer: Self-pay | Admitting: Cardiology

## 2017-03-09 ENCOUNTER — Telehealth: Payer: Self-pay | Admitting: Cardiology

## 2017-03-09 DIAGNOSIS — R0600 Dyspnea, unspecified: Secondary | ICD-10-CM

## 2017-03-09 DIAGNOSIS — R0609 Other forms of dyspnea: Principal | ICD-10-CM

## 2017-03-09 LAB — CBC WITH DIFFERENTIAL/PLATELET
Basophils Absolute: 0 10*3/uL (ref 0.0–0.2)
Basos: 1 %
EOS (ABSOLUTE): 0.1 10*3/uL (ref 0.0–0.4)
Eos: 2 %
Hematocrit: 29.8 % — ABNORMAL LOW (ref 37.5–51.0)
Hemoglobin: 9.3 g/dL — ABNORMAL LOW (ref 13.0–17.7)
Immature Grans (Abs): 0 10*3/uL (ref 0.0–0.1)
Immature Granulocytes: 0 %
Lymphocytes Absolute: 1.5 10*3/uL (ref 0.7–3.1)
Lymphs: 27 %
MCH: 24.2 pg — ABNORMAL LOW (ref 26.6–33.0)
MCHC: 31.2 g/dL — ABNORMAL LOW (ref 31.5–35.7)
MCV: 78 fL — ABNORMAL LOW (ref 79–97)
Monocytes Absolute: 0.5 10*3/uL (ref 0.1–0.9)
Monocytes: 9 %
Neutrophils Absolute: 3.3 10*3/uL (ref 1.4–7.0)
Neutrophils: 61 %
Platelets: 162 10*3/uL (ref 150–379)
RBC: 3.84 x10E6/uL — ABNORMAL LOW (ref 4.14–5.80)
RDW: 18.6 % — ABNORMAL HIGH (ref 12.3–15.4)
WBC: 5.4 10*3/uL (ref 3.4–10.8)

## 2017-03-09 NOTE — Telephone Encounter (Signed)
Patient called in requesting a copy of his recent labs. They have been placed at the for him. He verbalized his understanding.

## 2017-03-09 NOTE — Telephone Encounter (Signed)
MRs.Sunga is returning a call . Thanks

## 2017-03-09 NOTE — Telephone Encounter (Signed)
Returned call to patient lab results given. 

## 2017-03-09 NOTE — Telephone Encounter (Signed)
New Message  Pt says that he has additional question about his lab results. Please call

## 2017-03-12 ENCOUNTER — Telehealth: Payer: Self-pay | Admitting: *Deleted

## 2017-03-12 ENCOUNTER — Ambulatory Visit: Payer: Medicare Other | Admitting: Adult Health

## 2017-03-12 NOTE — Telephone Encounter (Signed)
Pt contacted pre-catheterization scheduled at [facility] for: Tuesday March 13, 2017 at 10:30 AM Verified arrival time and place: [place] at: 8 AM  Verified allergies listed in Epic. Verified no diabetes medications.  HOLD: Xarelto none 03/11/17, 03/12/17 until after cath. Furosemide AM of cath Viagra    Except hold medications  AM meds can be  taken pre-cath with sip of water including: ASA 81 mg am of cath   Confirmed patient has responsible person to drive home post procedure and observe patient for 24 hours: yes

## 2017-03-13 ENCOUNTER — Ambulatory Visit (HOSPITAL_COMMUNITY)
Admission: RE | Admit: 2017-03-13 | Discharge: 2017-03-13 | Disposition: A | Discharge status: Home / Self Care | Payer: Medicare Other | Source: Ambulatory Visit | Attending: Cardiology | Admitting: Cardiology

## 2017-03-13 ENCOUNTER — Encounter (HOSPITAL_COMMUNITY): Payer: Self-pay | Admitting: Cardiology

## 2017-03-13 ENCOUNTER — Ambulatory Visit (HOSPITAL_COMMUNITY)
Admission: RE | Disposition: A | Discharge status: Home / Self Care | Payer: Self-pay | Source: Ambulatory Visit | Attending: Cardiology

## 2017-03-13 DIAGNOSIS — I110 Hypertensive heart disease with heart failure: Secondary | ICD-10-CM | POA: Diagnosis not present

## 2017-03-13 DIAGNOSIS — Z79899 Other long term (current) drug therapy: Secondary | ICD-10-CM | POA: Insufficient documentation

## 2017-03-13 DIAGNOSIS — Z86711 Personal history of pulmonary embolism: Secondary | ICD-10-CM | POA: Insufficient documentation

## 2017-03-13 DIAGNOSIS — Z6841 Body Mass Index (BMI) 40.0 and over, adult: Secondary | ICD-10-CM | POA: Diagnosis not present

## 2017-03-13 DIAGNOSIS — E119 Type 2 diabetes mellitus without complications: Secondary | ICD-10-CM | POA: Diagnosis not present

## 2017-03-13 DIAGNOSIS — I10 Essential (primary) hypertension: Secondary | ICD-10-CM | POA: Diagnosis present

## 2017-03-13 DIAGNOSIS — Z888 Allergy status to other drugs, medicaments and biological substances status: Secondary | ICD-10-CM | POA: Diagnosis not present

## 2017-03-13 DIAGNOSIS — Z87891 Personal history of nicotine dependence: Secondary | ICD-10-CM | POA: Insufficient documentation

## 2017-03-13 DIAGNOSIS — I2510 Atherosclerotic heart disease of native coronary artery without angina pectoris: Secondary | ICD-10-CM | POA: Diagnosis not present

## 2017-03-13 DIAGNOSIS — G4733 Obstructive sleep apnea (adult) (pediatric): Secondary | ICD-10-CM | POA: Diagnosis present

## 2017-03-13 DIAGNOSIS — I272 Pulmonary hypertension, unspecified: Secondary | ICD-10-CM | POA: Insufficient documentation

## 2017-03-13 DIAGNOSIS — I480 Paroxysmal atrial fibrillation: Secondary | ICD-10-CM | POA: Insufficient documentation

## 2017-03-13 DIAGNOSIS — R0789 Other chest pain: Secondary | ICD-10-CM | POA: Insufficient documentation

## 2017-03-13 DIAGNOSIS — R0609 Other forms of dyspnea: Secondary | ICD-10-CM

## 2017-03-13 DIAGNOSIS — I5032 Chronic diastolic (congestive) heart failure: Secondary | ICD-10-CM | POA: Diagnosis present

## 2017-03-13 DIAGNOSIS — E7800 Pure hypercholesterolemia, unspecified: Secondary | ICD-10-CM | POA: Insufficient documentation

## 2017-03-13 DIAGNOSIS — Z7901 Long term (current) use of anticoagulants: Secondary | ICD-10-CM | POA: Insufficient documentation

## 2017-03-13 DIAGNOSIS — Z8546 Personal history of malignant neoplasm of prostate: Secondary | ICD-10-CM | POA: Insufficient documentation

## 2017-03-13 DIAGNOSIS — Z7951 Long term (current) use of inhaled steroids: Secondary | ICD-10-CM | POA: Insufficient documentation

## 2017-03-13 DIAGNOSIS — R079 Chest pain, unspecified: Secondary | ICD-10-CM | POA: Diagnosis present

## 2017-03-13 DIAGNOSIS — R0600 Dyspnea, unspecified: Secondary | ICD-10-CM

## 2017-03-13 DIAGNOSIS — I712 Thoracic aortic aneurysm, without rupture, unspecified: Secondary | ICD-10-CM | POA: Diagnosis present

## 2017-03-13 HISTORY — PX: LEFT HEART CATH AND CORONARY ANGIOGRAPHY: CATH118249

## 2017-03-13 HISTORY — PX: AORTIC ARCH ANGIOGRAPHY: CATH118224

## 2017-03-13 LAB — GLUCOSE, CAPILLARY: Glucose-Capillary: 143 mg/dL — ABNORMAL HIGH (ref 65–99)

## 2017-03-13 SURGERY — LEFT HEART CATH AND CORONARY ANGIOGRAPHY
Anesthesia: LOCAL

## 2017-03-13 MED ORDER — HEPARIN SODIUM (PORCINE) 1000 UNIT/ML IJ SOLN
INTRAMUSCULAR | Status: DC | PRN
  Administered 2017-03-13: 6000 [IU] via INTRAVENOUS

## 2017-03-13 MED ORDER — SODIUM CHLORIDE 0.9% FLUSH: 3.0000 mL | INTRAVENOUS | Status: DC | PRN

## 2017-03-13 MED ORDER — FENTANYL CITRATE (PF) 100 MCG/2ML IJ SOLN
INTRAMUSCULAR | Status: AC
  Filled 2017-03-13: qty 2

## 2017-03-13 MED ORDER — SODIUM CHLORIDE 0.9 % IV SOLN
INTRAVENOUS | Status: DC
  Administered 2017-03-13: 09:00:00 via INTRAVENOUS

## 2017-03-13 MED ORDER — IOPAMIDOL (ISOVUE-370) INJECTION 76%
INTRAVENOUS | Status: AC
  Filled 2017-03-13: qty 100

## 2017-03-13 MED ORDER — SODIUM CHLORIDE 0.9 % IV SOLN: 250.0000 mL | INTRAVENOUS | Status: DC | PRN

## 2017-03-13 MED ORDER — MIDAZOLAM HCL 2 MG/2ML IJ SOLN
INTRAMUSCULAR | Status: DC | PRN
  Administered 2017-03-13: 1 mg via INTRAVENOUS

## 2017-03-13 MED ORDER — RIVAROXABAN 20 MG PO TABS: 20.0000 mg | ORAL_TABLET | Freq: Every day | ORAL | Status: DC

## 2017-03-13 MED ORDER — VERAPAMIL HCL 2.5 MG/ML IV SOLN
INTRAVENOUS | Status: DC | PRN
  Administered 2017-03-13: 10 mL via INTRA_ARTERIAL

## 2017-03-13 MED ORDER — HEPARIN (PORCINE) IN NACL 2-0.9 UNIT/ML-% IJ SOLN: INTRAMUSCULAR | Status: AC | PRN

## 2017-03-13 MED ORDER — MIDAZOLAM HCL 2 MG/2ML IJ SOLN
INTRAMUSCULAR | Status: AC
  Filled 2017-03-13: qty 2

## 2017-03-13 MED ORDER — IOPAMIDOL (ISOVUE-370) INJECTION 76%
INTRAVENOUS | Status: DC | PRN
  Administered 2017-03-13: 97 mL via INTRA_ARTERIAL

## 2017-03-13 MED ORDER — SODIUM CHLORIDE 0.9% FLUSH: 3.0000 mL | Freq: Two times a day (BID) | INTRAVENOUS | Status: DC

## 2017-03-13 MED ORDER — SODIUM CHLORIDE 0.9 % WEIGHT BASED INFUSION: 1.0000 mL/kg/h | INTRAVENOUS | Status: AC

## 2017-03-13 MED ORDER — ASPIRIN 81 MG PO CHEW: 81.0000 mg | CHEWABLE_TABLET | ORAL | Status: DC

## 2017-03-13 MED ORDER — HEPARIN (PORCINE) IN NACL 2-0.9 UNIT/ML-% IJ SOLN
INTRAMUSCULAR | Status: AC
  Filled 2017-03-13: qty 1000

## 2017-03-13 MED ORDER — VERAPAMIL HCL 2.5 MG/ML IV SOLN
INTRAVENOUS | Status: AC
  Filled 2017-03-13: qty 2

## 2017-03-13 MED ORDER — HEPARIN SODIUM (PORCINE) 1000 UNIT/ML IJ SOLN
INTRAMUSCULAR | Status: AC
  Filled 2017-03-13: qty 1

## 2017-03-13 MED ORDER — IOPAMIDOL (ISOVUE-370) INJECTION 76%
INTRAVENOUS | Status: AC
  Filled 2017-03-13: qty 50

## 2017-03-13 MED ORDER — LIDOCAINE HCL (PF) 1 % IJ SOLN
INTRAMUSCULAR | Status: DC | PRN
  Administered 2017-03-13: 2 mL

## 2017-03-13 MED ORDER — LIDOCAINE HCL (PF) 1 % IJ SOLN
INTRAMUSCULAR | Status: AC
  Filled 2017-03-13: qty 30

## 2017-03-13 MED ORDER — FENTANYL CITRATE (PF) 100 MCG/2ML IJ SOLN
INTRAMUSCULAR | Status: DC | PRN
  Administered 2017-03-13: 25 ug via INTRAVENOUS

## 2017-03-13 MED ORDER — HEPARIN (PORCINE) IN NACL 2-0.9 UNIT/ML-% IJ SOLN
INTRAMUSCULAR | Status: AC | PRN
  Administered 2017-03-13 (×2): 500 mL via INTRA_ARTERIAL

## 2017-03-13 SURGICAL SUPPLY — 12 items
CATH INFINITI 5 FR JL6.0 (CATHETERS) ×2 IMPLANT
CATH INFINITI 5FR AL1 (CATHETERS) ×2 IMPLANT
CATH INFINITI 5FR ANG PIGTAIL (CATHETERS) ×2 IMPLANT
DEVICE RAD COMP TR BAND LRG (VASCULAR PRODUCTS) ×2 IMPLANT
GLIDESHEATH SLEND SS 6F .021 (SHEATH) ×2 IMPLANT
GUIDEWIRE INQWIRE 1.5J.035X260 (WIRE) ×1 IMPLANT
INQWIRE 1.5J .035X260CM (WIRE) ×2
KIT HEART LEFT (KITS) ×2 IMPLANT
KIT PREMIUM HAND CONTROLLER (KITS) ×2 IMPLANT
KIT SINGLE USE MANIFOLD (KITS) ×2 IMPLANT
PACK CARDIAC CATHETERIZATION (CUSTOM PROCEDURE TRAY) ×2 IMPLANT
TRANSDUCER W/STOPCOCK (MISCELLANEOUS) ×2 IMPLANT

## 2017-03-13 NOTE — Discharge Instructions (Signed)

## 2017-03-13 NOTE — Interval H&P Note (Signed)
History and Physical Interval Note:  03/13/2017 10:23 AM  Carlos Dawson  has presented today for surgery, with the diagnosis of abnormal mioview - shortness of breath  The various methods of treatment have been discussed with the patient and family. After consideration of risks, benefits and other options for treatment, the patient has consented to  Procedure(s): LEFT HEART CATH AND CORONARY ANGIOGRAPHY (N/A) as a surgical intervention .  The patient's history has been reviewed, patient examined, no change in status, stable for surgery.  I have reviewed the patient's chart and labs.  Questions were answered to the patient's satisfaction.    Cath Lab Visit (complete for each Cath Lab visit)  Clinical Evaluation Leading to the Procedure:   ACS: No.  Non-ACS:    Anginal Classification: CCS III  Anti-ischemic medical therapy: Minimal Therapy (1 class of medications)  Non-Invasive Test Results: Intermediate-risk stress test findings: cardiac mortality 1-3%/year  Prior CABG: No Previous CABG       Collier Salina [facility] 03/13/2017 10:25 AM

## 2017-03-14 ENCOUNTER — Telehealth: Payer: Self-pay | Admitting: Cardiology

## 2017-03-14 MED FILL — Heparin Sodium (Porcine) 2 Unit/ML in Sodium Chloride 0.9%: INTRAMUSCULAR | Qty: 1000 | Status: AC

## 2017-03-14 NOTE — Telephone Encounter (Signed)
New message ° °Pt verbalized that he is calling for the RN °

## 2017-03-14 NOTE — Telephone Encounter (Signed)
Spoke with patient and he had a cardiac cath yesterday. He was given discharge instructions to stop his Pantoprazole and had Omeprazole on medication list. Per patient his Omeprazole had been changed to Pantoprazole at recent ED visit. No new medications started at discharge. Discussed with Claiborne Billings D and ok to take Pantoprazole. Advised patient, verbalized understanding. He did call his PCP regarding his labs, seeing in the morning. Advised to stop Celebrex until he low hgb addressed, verbalized understanding.

## 2017-03-15 ENCOUNTER — Telehealth: Payer: Self-pay

## 2017-03-15 DIAGNOSIS — D509 Iron deficiency anemia, unspecified: Secondary | ICD-10-CM | POA: Diagnosis not present

## 2017-03-15 NOTE — Telephone Encounter (Signed)
Patient contacted regarding discharge from 03/13/2017 (7 hours) [place]  Patient understands to follow up with provider Martinique on 03-30-17 at 11am at [place] office. Patient understands discharge instructions? YES Patient understands medications and regiment? YES Patient understands to bring all medications to this visit? YES  PT STATES THAT HE IS FEELING FINE AND WILL BE AT HIS FOLLOW UP APPT

## 2017-03-29 DIAGNOSIS — D649 Anemia, unspecified: Secondary | ICD-10-CM | POA: Diagnosis not present

## 2017-03-29 NOTE — Progress Notes (Signed)
Cardiology Office Note    Date:  03/30/2017   ID:  Carlos Dawson, DOB 1940-05-27, MRN [id number]  PCP:  Josetta Huddle, MD  Cardiologist: Dr. Martinique   No chief complaint on file.   History of Present Illness:    Carlos Dawson is a 77 y.o. male with past medical history of CAD (s/p BMS to 2nd OM in 2012, low-risk NST in 12/2012), PAF (on Xarelto), chronic diastolic CHF, prior PE, HTN, HLD, Type 2 DM, and known thoracic aortic aneurysm who is seen for follow up dyspnea.   He was admitted from 8/23 - 09/15/2016 for evaluation of chest discomfort and palpitations. His initial EKG showed that he was in atrial fibrillation with RVR. He reported good compliance with his Xarelto and denied missing any doses, therefore a successful DCCV was performed in the ED at [facility].  He became hypotensive with SBP in the 80's following the procedure, therefore he was admitted overnight for further observation. His Losartan, Spironolactone, and Lasix were held at the time of discharge with plans to resume at follow-up if BP allowed. A CTA was also obtained during admission to rule out a recurrent PE and was negative for a PE but did show a stable 5.0 cm ascending thoracic aortic aneurysm. Antihypertensive therapy was resumed. On 10/20/16 he underwent L1-5 fusion by Dr. Ronnald Ramp.   On follow up in February  he noted increased symptoms of dyspnea on exertion and chest pressure for 3 months. Some improvement with inhalers.  No increase in edema or weight.  Oxygen levels have been good. Is still wearing CPAP. Myoview study was mildly abnormal with inferoapical reversible defect. EF 61%. Echo showed normal EF with moderate pulmonary HTN. Subsequent cardiac cath showed no obstructive CAD and no AI. Unable to cross AV due to distortion of the aortic root.  On follow up today still very frustrated with symptoms of dyspnea. Unable to exercise due to dyspnea at low level. He is being treated for iron deficiency anemia. Hgb  around 10. States he can pedal a bike ok but if he uses his arms or does any walking or goes upstairs can't get his breath. Weight stable.     Past Medical History:  Diagnosis Date  . Anticoagulant long-term use   . Aortic root enlargement ([place])   . Ascending aortic aneurysm [facility])    recent scan in October 2012 showing no change; followed by Dr. Servando Snare  . ASCVD (arteriosclerotic cardiovascular disease)    Prior BMS to the 2nd OM in September 2012; with repeat cath in October showing patency  . CAD (coronary artery disease)    a. s/p BMS to 2nd OM in Sept 2012; b. LexiScan Myoview (12/2012):  Inf infarct; bowel and motion artifact make study difficult to interpret; no ischemia; not gated; Low Risk  . CHF (congestive heart failure) ([place])    no recent issues 10/13/14  . Colonic polyp   . Contact lens/glasses fitting   . Diabetes mellitus without complication (HCC)    metphormin, average 154 dx 2017  . Diastolic dysfunction   . Generalized headaches    neck stenosis  . GERD (gastroesophageal reflux disease)   . Hearing loss   . Hearing loss    more so on left  . Hemorrhoids   . Hypertension   . IBS (irritable bowel syndrome)   . LVH (left ventricular hypertrophy)   . Mild intermittent asthma   . OA (osteoarthritis)   . Obesities, morbid ([place])   .  OSA (obstructive sleep apnea)    PSG 03/30/97 AHI 21, BPAP 13/9  . PAF (paroxysmal atrial fibrillation) ([place])    a. on Xarelto b. s/p DCCV in 08/2016  . Pneumonia   . Prostate CA [facility]    Oncologist  DR. Daralene Milch [facility] 09/24/14, undetermined tx   prostate  . Pulmonary embolism ([place])    2008  . Sleep apnea   . SOB (shortness of breath)    on excertion  . Thoracic aortic aneurysm (HCC)    Aortic Size Index=     5.0    /Body surface area is 2.43 meters squared. = 2.05  < 2.75 cm/m2      4% risk per year 2.75 to 4.25          8% risk per year > 4.25 cm/m2    20% risk per year   Stable aneurysmal dilation of the ascending aorta with  maximum AP diameter of 4.8 cm. Stable area of narrowing of the proximal most portion of the descending aorta measuring 2 cm., previously identified as an area of coarctation. No evidence of aortic dissection.  Coronary artery disease.  Normal appearance of the lungs.   Electronically Signed   By: Fidela Salisbury M.D.   On: 10/01/2014 08:50      Past Surgical History:  Procedure Laterality Date  . ACHILLES TENDON REPAIR    . AORTIC ARCH ANGIOGRAPHY N/A 03/13/2017   Procedure: AORTIC ARCH ANGIOGRAPHY;  Surgeon: Martinique, Shell Yandow M, MD;  Location: [facility];  Service: Cardiovascular;  Laterality: N/A;  . APPENDECTOMY    . BACK SURGERY    . CARDIAC CATHETERIZATION  2006  . CARDIAC CATHETERIZATION  October 2012   Stent patent  . CARPAL TUNNEL RELEASE     LEFT  . CERVICAL SPINE SURGERY  06/02/2010   lower back and neck  . CHOLECYSTECTOMY    . COLONOSCOPY WITH PROPOFOL N/A 12/29/2014   Procedure: COLONOSCOPY WITH PROPOFOL;  Surgeon: Garlan Fair, MD;  Location: [facility];  Service: Endoscopy;  Laterality: N/A;  . CORONARY STENT PLACEMENT  Sept 2012   2nd OM with BMS  . EYE SURGERY     bilateral cataract  . HEMORROIDECTOMY    . LAMINECTOMY  05/30/2012   L 4 L5  . LAMINECTOMY WITH POSTERIOR LATERAL ARTHRODESIS LEVEL 3 N/A 10/18/2016   Procedure: Posterior Lateral Fusion - Lumbar One-Four, segmental instrumentation Lumbar One-Five,  decompression,;  Surgeon: Eustace Moore, MD;  Location: [facility] OR;  Service: Neurosurgery;  Laterality: N/A;  . LAPAROSCOPIC GASTRIC BANDING    . LEFT AND RIGHT HEART CATHETERIZATION WITH CORONARY ANGIOGRAM N/A 05/07/2014   Procedure: LEFT AND RIGHT HEART CATHETERIZATION WITH CORONARY ANGIOGRAM;  Surgeon: Dajanay Northrup M Martinique, MD;  Location: [facility] CATH LAB;  Service: Cardiovascular;  Laterality: N/A;  . LEFT HEART CATH AND CORONARY ANGIOGRAPHY N/A 03/13/2017   Procedure: LEFT HEART CATH AND CORONARY ANGIOGRAPHY;  Surgeon: Martinique, Kire Ferg M, MD;  Location: [facility];  Service: Cardiovascular;  Laterality: N/A;  . LUMBAR LAMINECTOMY/DECOMPRESSION MICRODISCECTOMY N/A 05/04/2016   Procedure: Laminectomy and Foraminotomy - Thoracic twelve-Lumbar one -Posterior Fusion Lumbar one-two;  Surgeon: Eustace Moore, MD;  Location: [facility];  Service: Neurosurgery;  Laterality: N/A;  . POSTERIOR LUMBAR FUSION  10/18/2016  . ROTATOR CUFF REPAIR     both  . TONSILLECTOMY    . TRIGGER FINGER RELEASE     LEFT  . UVULOPALATOPHARYNGOPLASTY    . VASECTOMY  Current Medications: Outpatient Medications Prior to Visit  Medication Sig Dispense Refill  . acetaminophen (TYLENOL) 500 MG tablet Take 1,000 mg by mouth every 8 (eight) hours as needed for mild pain or headache.     . albuterol (PROVENTIL HFA;VENTOLIN HFA) 108 (90 Base) MCG/ACT inhaler Inhale 2 puffs into the lungs every 4 (four) hours as needed for wheezing or shortness of breath.    Marland Kitchen atorvastatin (LIPITOR) 10 MG tablet Take 5 mg by mouth daily at 6 PM.     . augmented betamethasone dipropionate (DIPROLENE-AF) 0.05 % cream Apply 1 application topically 2 (two) times daily as needed for itching.  3  . budesonide-formoterol (SYMBICORT) 80-4.5 MCG/ACT inhaler Inhale 2 puffs into the lungs 2 (two) times daily.     . Cholecalciferol (VITAMIN D-3) 5000 units TABS Take 5,000 Units by mouth daily.    Marland Kitchen dicyclomine (BENTYL) 20 MG tablet Take 20 mg by mouth 2 (two) times daily as needed for spasms.     . furosemide (LASIX) 40 MG tablet Take 80 mg by mouth 2 (two) times daily.    Marland Kitchen KLOR-CON M20 20 MEQ tablet TAKE 2 TABLETS BY MOUTH TWICE A DAY (Patient taking differently: Take 40 MEQ by mouth twice daily) 360 tablet 1  . losartan (COZAAR) 25 MG tablet TAKE 1 TABLET BY MOUTH EVERY DAY (Patient taking differently: Take 25 mg by mouth once daily) 90 tablet 1  . methocarbamol (ROBAXIN) 500 MG tablet Take 1 tablet (500 mg total) by mouth every 8 (eight) hours as needed for muscle spasms. 60 tablet 1  . metoprolol tartrate  (LOPRESSOR) 25 MG tablet TAKE 1 TABLET BY MOUTH TWICE A DAY.TAKE EXTRA 1/2 TAB IF SBP>110 (Patient taking differently: Take 25 mg by mouth 2 (two) times daily. TAKE EXTRA 12.5 MG IF SBP>110) 180 tablet 3  . montelukast (SINGULAIR) 10 MG tablet Take 10 mg by mouth at bedtime.    . nitroGLYCERIN (NITROSTAT) 0.4 MG SL tablet Place 1 tablet (0.4 mg total) under the tongue every 5 (five) minutes as needed for chest pain. 25 tablet 5  . pantoprazole (PROTONIX) 40 MG tablet Take 40 mg by mouth daily.    Vladimir Faster Glycol-Propyl Glycol (SYSTANE OP) Place 1 drop into both eyes daily as needed (for dry eyes).     . psyllium (METAMUCIL) 58.6 % packet Take 1 packet by mouth daily.    . rivaroxaban (XARELTO) 20 MG TABS tablet Take 1 tablet (20 mg total) by mouth daily with supper. 30 tablet   . sildenafil (VIAGRA) 100 MG tablet Take 100 mg by mouth as needed for erectile dysfunction.     . Skin Protectants, Misc. (EUCERIN) cream Apply 1 application topically as needed for dry skin.    Marland Kitchen spironolactone (ALDACTONE) 25 MG tablet Take 25 mg by mouth daily.    . tamsulosin (FLOMAX) 0.4 MG CAPS Take 0.4 mg by mouth every evening.     . topiramate (TOPAMAX) 25 MG capsule Take 50 mg by mouth 2 (two) times daily.     . traMADol (ULTRAM) 50 MG tablet Take 50 mg by mouth every 12 (twelve) hours as needed for moderate pain.      No facility-administered medications prior to visit.      Allergies:   Adhesive [tape]; Latex; and Morphine   Social History   Socioeconomic History  . Marital status: Married    Spouse name: None  . Number of children: 3  . Years of education: None  . Highest  education level: None  Social Needs  . Financial resource strain: None  . Food insecurity - worry: None  . Food insecurity - inability: None  . Transportation needs - medical: None  . Transportation needs - non-medical: None  Occupational History  . Occupation: Retired from Scientist, clinical (histocompatibility and immunogenetics): RETIRED  Tobacco Use  .  Smoking status: Former Smoker    Packs/day: 1.50    Years: 30.00    Pack years: 45.00    Last attempt to quit: 01/24/1992    Years since quitting: 25.1  . Smokeless tobacco: Never Used  . Tobacco comment: Stopped in 1983  Substance and Sexual Activity  . Alcohol use: No  . Drug use: No  . Sexual activity: Yes  Other Topics Concern  . None  Social History Narrative  . None     Family History:  The patient's family history includes Diabetes in his mother; Emphysema (age of onset: 37) in his father; Heart attack in his sister; Heart disease in his mother; Other in his mother.   Review of Systems:   Please see the history of present illness.    All other systems reviewed and are otherwise negative except as noted above.   Physical Exam:    VS:  BP 114/62   Pulse 62   Ht 5\' 7"  (1.702 m)   Wt 274 lb (124.3 kg)   BMI 42.91 kg/m    GENERAL:  Well appearing, morbidly obese WM in NAD HEENT:  PERRL, EOMI, sclera are clear. Oropharynx is clear. NECK:  No jugular venous distention, carotid upstroke brisk and symmetric, no bruits, no thyromegaly or adenopathy LUNGS:  Clear to auscultation bilaterally CHEST:  Unremarkable HEART:  RRR,  PMI not displaced or sustained,S1 and S2 within normal limits, no S3, no S4: no clicks, no rubs, no murmurs ABD:  Soft, nontender. BS +, no masses or bruits. No hepatomegaly, no splenomegaly EXT:  2 + pulses throughout, no edema, no cyanosis no clubbing SKIN:  Warm and dry.  No rashes NEURO:  Alert and oriented x 3. Cranial nerves II through XII intact. PSYCH:  Cognitively intact      Wt Readings from Last 3 Encounters:  03/30/17 274 lb (124.3 kg)  03/13/17 270 lb (122.5 kg)  03/06/17 271 lb (122.9 kg)      Studies/Labs Reviewed:   EKG:  EKG is not ordered today.   Recent Labs: 09/14/2016: B Natriuretic Peptide 476.3; Magnesium 2.0 03/08/2017: BUN 27; Creatinine, Ser 1.21; Hemoglobin 9.3; Platelets 162; Potassium 4.7; Sodium 140   Lipid  Panel    Component Value Date/Time   CHOL 118 01/07/2013 0410   TRIG 133 01/07/2013 0410   HDL 40 01/07/2013 0410   CHOLHDL 3.0 01/07/2013 0410   VLDL 27 01/07/2013 0410   LDLCALC 51 01/07/2013 0410   Labs dated 07/11/16: cholesterol 114, triglycerides 80, HDL 46, LDL 52. A1c 7.1%.  Dated 01/24/17: cholesterol 110, triglycerides 50, HDL 52, LDL 48. A1c 7.8%.    Additional studies/ records that were reviewed today include:   Myoview 03/06/17: Study Highlights    The left ventricular ejection fraction is normal (55-65%).  Nuclear stress EF: 61%.  There is a small defect of mild severity present in the basal inferior, mid inferior and apex location. The defect is partially reversible. Overall image quality is poor due to soft tissue attenuation. Cannot rule out a small area of ischemia.  There is evidence of transient ischemic dilatation with a TID of 1.27.  This is an intermediate risk study.  There was no ST segment deviation noted during stress    Echo 03/07/17: Study Conclusions  - Left ventricle: The cavity size was normal. Wall thickness was   increased in a pattern of mild LVH. Systolic function was normal.   The estimated ejection fraction was in the range of 55% to 60%.   Wall motion was normal; there were no regional wall motion   abnormalities. Left ventricular diastolic function parameters   were normal. - Aortic valve: There was very mild stenosis. Valve area (VTI):   1.81 cm^2. Valve area (Vmax): 1.94 cm^2. Valve area (Vmean): 1.85   cm^2. - Right atrium: The atrium was mildly dilated. - Atrial septum: No defect or patent foramen ovale was identified. - Pulmonary arteries: PA peak pressure: 54 mm Hg (S).  Cardiac cath 03/15/17: Conclusion     Previously placed Ost 2nd Mrg to 2nd Mrg stent (unknown type) is widely patent.  There is no aortic valve regurgitation.   1. No significant obstructive CAD 2. Aneurysmal thoracic aorta.   Plan: continue medical  management . Consider pulmonary evaluation for symptoms of dyspnea.      Assessment:    1. Coronary artery disease involving native coronary artery of native heart without angina pectoris   2. Chronic diastolic heart failure ([place])   3. Thoracic aortic aneurysm without rupture (HCC)   4. Pulmonary HTN ([place])      Plan:   In order of problems listed above:  1. Paroxysmal Atrial Fibrillation/ Long-term Anticoagulation - s/p DCCV in August 2018. Appears to be maintaining NSR well.  - This patients CHA2DS2-VASc Score and unadjusted Ischemic Stroke Rate (% per year) is equal to 9.7 % stroke rate/year from a score of 6 (HTN, DM, Vascular, Age, TE (2)). He denies any evidence of active bleeding. Continue Xarelto for anticoagulation.  - will continue Atenolol 25mg  daily for rate-control.  2. CAD - s/p BMS to 2nd OM in 2012, recent cardiac cath showed continued patency with nonobstructive disease. - Continue beta blocker and statin therapy. No ASA secondary to need for Xarelto.  3. Chronic diastolic CHF - echo in 1610 showed a preserved EF of 55-60%. He has no significant increase in  edema on examination and weight is stable.  - continue current Lasix dosing of 80mg  BID.   4. HTN - BP is well-controlled  - continue Atenolol 25mg  daily, Losartan 25mg  daily, and Spironolactone 25mg  daily.  5. HLD -  LDL 52.  - continue Atorvastatin 10mg  daily.   6. Thoracic aortic aneurysm  - stable at 5.0 cm by recent CT Scan. - followed by Dr. Servando Snare with CT Surgery.  - patient is very anxious about his aneurysm rupturing. Understands that he would be very high risk for surgery with increased risk of major mortality and morbidity. He states he is willing to undertake this risk to prevent his aorta from "ripping". Explained that his aortic size really hasn't changed from 2000 so it is hard to justify such a major intervention.   7. Chronic respiratory failure due to morbid obesity and OSA. Followed  by pulmonary. I am concerned about his increasing dyspnea on exertion. I really do not find evidence of overt CHF. Recent cardiac cath showed no significant CAD. Prior right heart cath in 2016 showed only mild pulmonary HTN. Will ask Dr. Halford Chessman to reevaluate on upcoming visit. ? If repeat PFTs would be helpful. Patient feels oxygen would be helpful but so far he  hasn't qualified. If pulmonary evaluation is unrevealing consider cardiopulmonary stress test.   8. Pulmonary HTN secondary to #3 and #7.  Cardiac cath in 2016 showed mild pulmonary venous HTN.       Signed, Anastasha Ortez Martinique, Violet 03/30/2017 6:11 PM    [facility] [address] [place], [place] [address] Phone: [phone]

## 2017-03-30 ENCOUNTER — Ambulatory Visit (INDEPENDENT_AMBULATORY_CARE_PROVIDER_SITE_OTHER): Payer: Medicare Other | Admitting: Cardiology

## 2017-03-30 ENCOUNTER — Encounter: Payer: Self-pay | Admitting: Cardiology

## 2017-03-30 VITALS — BP 114/62 | HR 62 | Ht 67.0 in | Wt 274.0 lb

## 2017-03-30 DIAGNOSIS — I712 Thoracic aortic aneurysm, without rupture, unspecified: Secondary | ICD-10-CM

## 2017-03-30 DIAGNOSIS — I5032 Chronic diastolic (congestive) heart failure: Secondary | ICD-10-CM

## 2017-03-30 DIAGNOSIS — I272 Pulmonary hypertension, unspecified: Secondary | ICD-10-CM

## 2017-03-30 DIAGNOSIS — I2510 Atherosclerotic heart disease of native coronary artery without angina pectoris: Secondary | ICD-10-CM

## 2017-03-30 NOTE — Patient Instructions (Signed)
Continue your current therapy   

## 2017-04-04 ENCOUNTER — Encounter: Payer: Self-pay | Admitting: Pulmonary Disease

## 2017-04-04 ENCOUNTER — Ambulatory Visit (INDEPENDENT_AMBULATORY_CARE_PROVIDER_SITE_OTHER): Payer: Medicare Other | Admitting: Pulmonary Disease

## 2017-04-04 VITALS — BP 114/62 | HR 58 | Ht 67.0 in | Wt 275.2 lb

## 2017-04-04 DIAGNOSIS — I2510 Atherosclerotic heart disease of native coronary artery without angina pectoris: Secondary | ICD-10-CM | POA: Diagnosis not present

## 2017-04-04 DIAGNOSIS — R0600 Dyspnea, unspecified: Secondary | ICD-10-CM

## 2017-04-04 DIAGNOSIS — G4733 Obstructive sleep apnea (adult) (pediatric): Secondary | ICD-10-CM

## 2017-04-04 DIAGNOSIS — J4530 Mild persistent asthma, uncomplicated: Secondary | ICD-10-CM | POA: Diagnosis not present

## 2017-04-04 DIAGNOSIS — Z6841 Body Mass Index (BMI) 40.0 and over, adult: Secondary | ICD-10-CM | POA: Diagnosis not present

## 2017-04-04 DIAGNOSIS — R0609 Other forms of dyspnea: Secondary | ICD-10-CM | POA: Diagnosis not present

## 2017-04-04 NOTE — Patient Instructions (Addendum)
Will arrange for overnight oxygen test with CPAP  Follow up in 3 months

## 2017-04-04 NOTE — Progress Notes (Signed)
Pulmonary, Critical Care, and Sleep Medicine  Chief Complaint  Patient presents with  . Follow-up    Pt stable with asthma, pt is very weak and tired, and chest tightness. Pt is doing well overall with cpap machine    Vital signs: BP 114/62 (BP Location: Left Arm, Cuff Size: Normal)   Pulse (!) 58   Ht 5\' 7"  (1.702 m)   Wt 275 lb 3.2 oz (124.8 kg)   SpO2 96%   BMI 43.10 kg/m   History of Present Illness: Carlos Dawson is a 77 y.o. male former smoker with OSA, and asthma.  He has been feeling weaker.  He is getting more short of breath with activity and gets tired more quickly.  He isn't having cough, wheeze or sputum.  His leg swelling isn't any worse.  He is not having fever, sinus congestion, sore throat, or hemoptysis.  Physical Exam:  General - pleasant Eyes - pupils reactive ENT - no sinus tenderness, no oral exudate, no LAN Cardiac - regular, no murmur Chest - no wheeze, rales Abd - soft, non tender Ext - no edema Skin - no rashes Neuro - normal strength Psych - normal mood   Assessment/Plan:  Obstructive sleep apnea. - he reports compliance with CPAP and benefit from therapy - continue CPAP 10 cm H2O  Mild, persistent asthma. - continue singulair, symbicort, and prn albuterol  Allergic rhinitis with upper airway cough syndrome. - singulair and prn dymista  Chronic hypoxic respiratory failure. - will arrange for ONO with CPAP to determine if he might need to resume oxygen at night - he would likely need in lab titration study prior to restarting nocturnal oxygen  Dyspnea on exertion. - likely combination of deconditioning, asthma, and diastolic CHF - encouraged him to maintain his activity level as able   Time spent 27 minutes.   Patient Instructions  Will arrange for overnight oxygen test with CPAP  Follow up in 3 months   Chesley Mires, MD [place] 04/04/2017, 3:06 PM Pager:  [phone]  Flow  Sheet  Pulmonary tests: PFT 12/20/10 >> FEV1 2.13(79%), FEV1% 71, TLC 5.80(100%), DLCO 57%, no BD CT chest 09/14/11 >> Stable dilatation of ascending thoracic aorta 4.7 cm, PA 4.1 cm Spirometry 05/11/15 >> FEV1 1.68 (55%), FEV1% 69 CT angio chest 10/01/14 >> aortic dilation FeNO 08/13/15 >> 13 Room air SpO2 with exertion 08/13/15 >> 87% - improved with 2 liters oxygen  Sleep tests PSG 03/30/97 >> AHI 21 ONO with BPAP and room air 12/12/10 >> Test time 6 hrs 56 min. Basal SpO2 93.1%, low SpO2 86%. Spent 2.8 min with SpO2 < 88%. PSG 10/21/13 >> AHI 40.1, SaO2 80%, PLMI 75.6, 7 beat VT CPAP 12/28/13 >> CPAP 10 cm H2O >> AHI 1.5, +R  ONO with CPAP 08/19/15 >>test time 8 hrs 40 min. Basal SpO2 92.3%, low SpO2 86%. Spent 1.3 min with SpO2 <88%. CPAP 06/11/16 to 07/10/16 >> used on 28 of 30 nights with average 7 hrs 57 min.  Average AHI 2.1 with CPAP 10 cm H2O  Cardiac tests Echo 03/07/17 >> mild LVH, EF 55 to 60%, mild AS, PAS 54 mmHg  Past Medical History: He  has a past medical history of Anticoagulant long-term use, Aortic root enlargement (HCC), Ascending aortic aneurysm (HCC), ASCVD (arteriosclerotic cardiovascular disease), CAD (coronary artery disease), CHF (congestive heart failure) ([place]), Colonic polyp, Contact lens/glasses fitting, Diabetes mellitus without complication ([place]), Diastolic dysfunction, Generalized headaches, GERD (gastroesophageal reflux disease), Hearing loss, Hearing  loss, Hemorrhoids, Hypertension, IBS (irritable bowel syndrome), LVH (left ventricular hypertrophy), Mild intermittent asthma, OA (osteoarthritis), Obesities, morbid ([place]), OSA (obstructive sleep apnea), PAF (paroxysmal atrial fibrillation) ([place]), Pneumonia, Prostate CA ([place]), Pulmonary embolism ([place]), Sleep apnea, SOB (shortness of breath), and Thoracic aortic aneurysm ([place]).  Past Surgical History: He  has a past surgical history that includes Hemorroidectomy; Vasectomy; Cervical spine surgery (06/02/2010);  Cholecystectomy; Rotator cuff repair; Achilles tendon repair; Cardiac catheterization (2006); Coronary stent placement (Sept 2012); Cardiac catheterization (October 2012); Appendectomy; Eye surgery; Laminectomy (05/30/2012); left and right heart catheterization with coronary angiogram (N/A, 05/07/2014); Laparoscopic gastric banding; Uvulopalatopharyngoplasty; Colonoscopy with propofol (N/A, 12/29/2014); Tonsillectomy; Carpal tunnel release; Trigger finger release; Lumbar laminectomy/decompression microdiscectomy (N/A, 05/04/2016); Back surgery; Posterior lumbar fusion (10/18/2016); Laminectomy with posterior lateral arthrodesis level 3 (N/A, 10/18/2016); LEFT HEART CATH AND CORONARY ANGIOGRAPHY (N/A, 03/13/2017); and AORTIC ARCH ANGIOGRAPHY (N/A, 03/13/2017).  Family History: His family history includes Diabetes in his mother; Emphysema (age of onset: 1) in his father; Heart attack in his sister; Heart disease in his mother; Other in his mother.  Social History: He  reports that he quit smoking about 25 years ago. He has a 45.00 pack-year smoking history. he has never used smokeless tobacco. He reports that he does not drink alcohol or use drugs.  Medications: Allergies as of 04/04/2017      Reactions   Adhesive [tape] Itching, Rash   Latex Itching, Rash, Other (See Comments)   Bandaids   Morphine Itching      Medication List        Accurate as of 04/04/17  3:06 PM. Always use your most recent med list.          acetaminophen 500 MG tablet Commonly known as:  TYLENOL Take 1,000 mg by mouth every 8 (eight) hours as needed for mild pain or headache.   albuterol 108 (90 Base) MCG/ACT inhaler Commonly known as:  PROVENTIL HFA;VENTOLIN HFA Inhale 2 puffs into the lungs every 4 (four) hours as needed for wheezing or shortness of breath.   atorvastatin 10 MG tablet Commonly known as:  LIPITOR Take 5 mg by mouth daily at 6 PM.   augmented betamethasone dipropionate 0.05 % cream Commonly known  as:  DIPROLENE-AF Apply 1 application topically 2 (two) times daily as needed for itching.   budesonide-formoterol 80-4.5 MCG/ACT inhaler Commonly known as:  SYMBICORT Inhale 2 puffs into the lungs 2 (two) times daily.   dicyclomine 20 MG tablet Commonly known as:  BENTYL Take 20 mg by mouth 2 (two) times daily as needed for spasms.   eucerin cream Apply 1 application topically as needed for dry skin.   furosemide 40 MG tablet Commonly known as:  LASIX Take 80 mg by mouth 2 (two) times daily.   KLOR-CON M20 20 MEQ tablet Generic drug:  potassium chloride SA TAKE 2 TABLETS BY MOUTH TWICE A DAY   losartan 25 MG tablet Commonly known as:  COZAAR TAKE 1 TABLET BY MOUTH EVERY DAY   methocarbamol 500 MG tablet Commonly known as:  ROBAXIN Take 1 tablet (500 mg total) by mouth every 8 (eight) hours as needed for muscle spasms.   metoprolol tartrate 25 MG tablet Commonly known as:  LOPRESSOR TAKE 1 TABLET BY MOUTH TWICE A DAY.TAKE EXTRA 1/2 TAB IF SBP>110   montelukast 10 MG tablet Commonly known as:  SINGULAIR Take 10 mg by mouth at bedtime.   nitroGLYCERIN 0.4 MG SL tablet Commonly known as:  NITROSTAT Place 1 tablet (0.4 mg  total) under the tongue every 5 (five) minutes as needed for chest pain.   pantoprazole 40 MG tablet Commonly known as:  PROTONIX Take 40 mg by mouth daily.   psyllium 58.6 % packet Commonly known as:  METAMUCIL Take 1 packet by mouth daily.   rivaroxaban 20 MG Tabs tablet Commonly known as:  XARELTO Take 1 tablet (20 mg total) by mouth daily with supper.   sildenafil 100 MG tablet Commonly known as:  VIAGRA Take 100 mg by mouth as needed for erectile dysfunction.   spironolactone 25 MG tablet Commonly known as:  ALDACTONE Take 25 mg by mouth daily.   SYSTANE OP Place 1 drop into both eyes daily as needed (for dry eyes).   tamsulosin 0.4 MG Caps capsule Commonly known as:  FLOMAX Take 0.4 mg by mouth every evening.   topiramate 25 MG  capsule Commonly known as:  TOPAMAX Take 50 mg by mouth 2 (two) times daily.   traMADol 50 MG tablet Commonly known as:  ULTRAM Take 50 mg by mouth every 12 (twelve) hours as needed for moderate pain.   Vitamin D-3 5000 units Tabs Take 5,000 Units by mouth daily.

## 2017-04-05 ENCOUNTER — Other Ambulatory Visit: Payer: Self-pay | Admitting: Gastroenterology

## 2017-04-05 DIAGNOSIS — D509 Iron deficiency anemia, unspecified: Secondary | ICD-10-CM | POA: Diagnosis not present

## 2017-04-06 ENCOUNTER — Telehealth: Payer: Self-pay | Admitting: Pulmonary Disease

## 2017-04-06 NOTE — Telephone Encounter (Signed)
Spoke with pt's wife. States that pt is having issues logging in to [employer]. Advised her that we have no way of helping with that. She states that [facility] told her to call her if she had any problems. Pt's wife wants to speak to [facility].

## 2017-04-06 NOTE — Telephone Encounter (Signed)
Called and spoke with patient and pt's wife regarding mychart message Pt's wife stated that she was able to active both accts with no problem Nothing further needed at this time

## 2017-04-12 ENCOUNTER — Other Ambulatory Visit: Payer: Self-pay | Admitting: *Deleted

## 2017-04-12 DIAGNOSIS — I712 Thoracic aortic aneurysm, without rupture, unspecified: Secondary | ICD-10-CM

## 2017-04-12 NOTE — Progress Notes (Unsigned)
Ct a 

## 2017-04-15 ENCOUNTER — Emergency Department (HOSPITAL_COMMUNITY): Payer: Medicare Other

## 2017-04-15 ENCOUNTER — Encounter (HOSPITAL_COMMUNITY): Payer: Self-pay | Admitting: Emergency Medicine

## 2017-04-15 ENCOUNTER — Inpatient Hospital Stay (HOSPITAL_COMMUNITY)
Admission: EM | Admit: 2017-04-15 | Discharge: 2017-04-18 | DRG: 309 | Disposition: A | Discharge status: Home / Self Care | Payer: Medicare Other | Attending: Cardiovascular Disease | Admitting: Cardiovascular Disease

## 2017-04-15 ENCOUNTER — Other Ambulatory Visit: Payer: Self-pay

## 2017-04-15 DIAGNOSIS — Z6841 Body Mass Index (BMI) 40.0 and over, adult: Secondary | ICD-10-CM

## 2017-04-15 DIAGNOSIS — Z7901 Long term (current) use of anticoagulants: Secondary | ICD-10-CM | POA: Diagnosis not present

## 2017-04-15 DIAGNOSIS — R0602 Shortness of breath: Secondary | ICD-10-CM | POA: Diagnosis not present

## 2017-04-15 DIAGNOSIS — K589 Irritable bowel syndrome without diarrhea: Secondary | ICD-10-CM | POA: Diagnosis present

## 2017-04-15 DIAGNOSIS — I4892 Unspecified atrial flutter: Secondary | ICD-10-CM | POA: Diagnosis present

## 2017-04-15 DIAGNOSIS — I5031 Acute diastolic (congestive) heart failure: Secondary | ICD-10-CM | POA: Diagnosis not present

## 2017-04-15 DIAGNOSIS — Z8546 Personal history of malignant neoplasm of prostate: Secondary | ICD-10-CM

## 2017-04-15 DIAGNOSIS — G4733 Obstructive sleep apnea (adult) (pediatric): Secondary | ICD-10-CM | POA: Diagnosis not present

## 2017-04-15 DIAGNOSIS — E669 Obesity, unspecified: Secondary | ICD-10-CM

## 2017-04-15 DIAGNOSIS — Z8249 Family history of ischemic heart disease and other diseases of the circulatory system: Secondary | ICD-10-CM

## 2017-04-15 DIAGNOSIS — I712 Thoracic aortic aneurysm, without rupture, unspecified: Secondary | ICD-10-CM | POA: Diagnosis present

## 2017-04-15 DIAGNOSIS — Z79899 Other long term (current) drug therapy: Secondary | ICD-10-CM | POA: Diagnosis not present

## 2017-04-15 DIAGNOSIS — H9190 Unspecified hearing loss, unspecified ear: Secondary | ICD-10-CM | POA: Diagnosis present

## 2017-04-15 DIAGNOSIS — Z955 Presence of coronary angioplasty implant and graft: Secondary | ICD-10-CM

## 2017-04-15 DIAGNOSIS — R079 Chest pain, unspecified: Secondary | ICD-10-CM | POA: Diagnosis not present

## 2017-04-15 DIAGNOSIS — Z9884 Bariatric surgery status: Secondary | ICD-10-CM | POA: Diagnosis not present

## 2017-04-15 DIAGNOSIS — Z9989 Dependence on other enabling machines and devices: Secondary | ICD-10-CM

## 2017-04-15 DIAGNOSIS — I480 Paroxysmal atrial fibrillation: Principal | ICD-10-CM | POA: Diagnosis present

## 2017-04-15 DIAGNOSIS — I272 Pulmonary hypertension, unspecified: Secondary | ICD-10-CM | POA: Diagnosis present

## 2017-04-15 DIAGNOSIS — I25810 Atherosclerosis of coronary artery bypass graft(s) without angina pectoris: Secondary | ICD-10-CM | POA: Diagnosis not present

## 2017-04-15 DIAGNOSIS — Z86711 Personal history of pulmonary embolism: Secondary | ICD-10-CM | POA: Diagnosis not present

## 2017-04-15 DIAGNOSIS — I7789 Other specified disorders of arteries and arterioles: Secondary | ICD-10-CM | POA: Diagnosis present

## 2017-04-15 DIAGNOSIS — G4730 Sleep apnea, unspecified: Secondary | ICD-10-CM | POA: Diagnosis present

## 2017-04-15 DIAGNOSIS — K219 Gastro-esophageal reflux disease without esophagitis: Secondary | ICD-10-CM | POA: Diagnosis present

## 2017-04-15 DIAGNOSIS — I4891 Unspecified atrial fibrillation: Secondary | ICD-10-CM | POA: Diagnosis present

## 2017-04-15 DIAGNOSIS — Z91048 Other nonmedicinal substance allergy status: Secondary | ICD-10-CM

## 2017-04-15 DIAGNOSIS — J4520 Mild intermittent asthma, uncomplicated: Secondary | ICD-10-CM | POA: Diagnosis not present

## 2017-04-15 DIAGNOSIS — E785 Hyperlipidemia, unspecified: Secondary | ICD-10-CM | POA: Diagnosis present

## 2017-04-15 DIAGNOSIS — I5032 Chronic diastolic (congestive) heart failure: Secondary | ICD-10-CM | POA: Diagnosis present

## 2017-04-15 DIAGNOSIS — Z981 Arthrodesis status: Secondary | ICD-10-CM

## 2017-04-15 DIAGNOSIS — Z87891 Personal history of nicotine dependence: Secondary | ICD-10-CM

## 2017-04-15 DIAGNOSIS — I509 Heart failure, unspecified: Secondary | ICD-10-CM | POA: Diagnosis not present

## 2017-04-15 DIAGNOSIS — I2510 Atherosclerotic heart disease of native coronary artery without angina pectoris: Secondary | ICD-10-CM | POA: Diagnosis present

## 2017-04-15 DIAGNOSIS — I10 Essential (primary) hypertension: Secondary | ICD-10-CM | POA: Diagnosis present

## 2017-04-15 DIAGNOSIS — E119 Type 2 diabetes mellitus without complications: Secondary | ICD-10-CM

## 2017-04-15 DIAGNOSIS — Z7951 Long term (current) use of inhaled steroids: Secondary | ICD-10-CM | POA: Diagnosis not present

## 2017-04-15 DIAGNOSIS — Z91040 Latex allergy status: Secondary | ICD-10-CM

## 2017-04-15 DIAGNOSIS — Z5181 Encounter for therapeutic drug level monitoring: Secondary | ICD-10-CM | POA: Diagnosis not present

## 2017-04-15 DIAGNOSIS — I110 Hypertensive heart disease with heart failure: Secondary | ICD-10-CM | POA: Diagnosis present

## 2017-04-15 DIAGNOSIS — E1169 Type 2 diabetes mellitus with other specified complication: Secondary | ICD-10-CM | POA: Diagnosis not present

## 2017-04-15 DIAGNOSIS — I4581 Long QT syndrome: Secondary | ICD-10-CM | POA: Diagnosis present

## 2017-04-15 DIAGNOSIS — Z885 Allergy status to narcotic agent status: Secondary | ICD-10-CM

## 2017-04-15 DIAGNOSIS — I25119 Atherosclerotic heart disease of native coronary artery with unspecified angina pectoris: Secondary | ICD-10-CM | POA: Diagnosis present

## 2017-04-15 LAB — CBC
HCT: 38.9 % — ABNORMAL LOW (ref 39.0–52.0)
Hemoglobin: 12 g/dL — ABNORMAL LOW (ref 13.0–17.0)
MCH: 25.5 pg — ABNORMAL LOW (ref 26.0–34.0)
MCHC: 30.8 g/dL (ref 30.0–36.0)
MCV: 82.6 fL (ref 78.0–100.0)
Platelets: 178 10*3/uL (ref 150–400)
RBC: 4.71 MIL/uL (ref 4.22–5.81)
RDW: 23.2 % — ABNORMAL HIGH (ref 11.5–15.5)
WBC: 6.4 10*3/uL (ref 4.0–10.5)

## 2017-04-15 LAB — BASIC METABOLIC PANEL
Anion gap: 10 (ref 5–15)
Anion gap: 9 (ref 5–15)
BUN: 19 mg/dL (ref 6–20)
BUN: 18 mg/dL (ref 6–20)
CO2: 21 mmol/L — ABNORMAL LOW (ref 22–32)
CO2: 24 mmol/L (ref 22–32)
Calcium: 8.8 mg/dL — ABNORMAL LOW (ref 8.9–10.3)
Calcium: 8.6 mg/dL — ABNORMAL LOW (ref 8.9–10.3)
Chloride: 107 mmol/L (ref 101–111)
Chloride: 107 mmol/L (ref 101–111)
Creatinine, Ser: 1.2 mg/dL (ref 0.61–1.24)
Creatinine, Ser: 1.15 mg/dL (ref 0.61–1.24)
GFR calc Af Amer: >60 mL/min (ref >60)
GFR calc Af Amer: >60 mL/min (ref >60)
GFR calc non Af Amer: 57 mL/min — ABNORMAL LOW (ref >60)
GFR calc non Af Amer: >60 mL/min (ref >60)
Glucose, Bld: 181 mg/dL — ABNORMAL HIGH (ref 65–99)
Glucose, Bld: 136 mg/dL — ABNORMAL HIGH (ref 65–99)
Potassium: 3.9 mmol/L (ref 3.5–5.1)
Potassium: 3.6 mmol/L (ref 3.5–5.1)
Sodium: 138 mmol/L (ref 135–145)
Sodium: 140 mmol/L (ref 135–145)

## 2017-04-15 LAB — MRSA PCR SCREENING: MRSA by PCR: NEGATIVE

## 2017-04-15 LAB — I-STAT TROPONIN, ED
Troponin i, poc: 0 ng/mL (ref 0.00–0.08)
Troponin i, poc: 0 ng/mL (ref 0.00–0.08)

## 2017-04-15 LAB — BRAIN NATRIURETIC PEPTIDE: B Natriuretic Peptide: 191.1 pg/mL — ABNORMAL HIGH (ref 0.0–100.0)

## 2017-04-15 LAB — MAGNESIUM: Magnesium: 2.2 mg/dL (ref 1.7–2.4)

## 2017-04-15 MED ORDER — ETOMIDATE 2 MG/ML IV SOLN
INTRAVENOUS | Status: AC | PRN
  Administered 2017-04-15: 10 mg via INTRAVENOUS

## 2017-04-15 MED ORDER — SPIRONOLACTONE 25 MG PO TABS
25.0000 mg | ORAL_TABLET | Freq: Every day | ORAL | Status: DC
  Administered 2017-04-15 – 2017-04-18 (×4): 25 mg via ORAL
  Filled 2017-04-15 (×4): qty 1

## 2017-04-15 MED ORDER — DICYCLOMINE HCL 20 MG PO TABS: 20.0000 mg | ORAL_TABLET | Freq: Two times a day (BID) | ORAL | Status: DC | PRN

## 2017-04-15 MED ORDER — SODIUM CHLORIDE 0.9 % IV SOLN: 250.0000 mL | INTRAVENOUS | Status: DC | PRN

## 2017-04-15 MED ORDER — TAMSULOSIN HCL 0.4 MG PO CAPS
0.4000 mg | ORAL_CAPSULE | Freq: Every evening | ORAL | Status: DC
  Administered 2017-04-15 – 2017-04-17 (×3): 0.4 mg via ORAL
  Filled 2017-04-15 (×3): qty 1

## 2017-04-15 MED ORDER — POTASSIUM CHLORIDE CRYS ER 20 MEQ PO TBCR
40.0000 meq | EXTENDED_RELEASE_TABLET | ORAL | Status: AC
  Administered 2017-04-15 – 2017-04-16 (×2): 40 meq via ORAL
  Filled 2017-04-15: qty 2

## 2017-04-15 MED ORDER — ACETAMINOPHEN 325 MG PO TABS
650.0000 mg | ORAL_TABLET | ORAL | Status: DC | PRN
  Administered 2017-04-15: 650 mg via ORAL
  Filled 2017-04-15: qty 2

## 2017-04-15 MED ORDER — TRAMADOL HCL 50 MG PO TABS
50.0000 mg | ORAL_TABLET | Freq: Two times a day (BID) | ORAL | Status: DC | PRN
  Administered 2017-04-17 – 2017-04-18 (×2): 50 mg via ORAL
  Filled 2017-04-15 (×2): qty 1

## 2017-04-15 MED ORDER — METHOCARBAMOL 500 MG PO TABS: 500.0000 mg | ORAL_TABLET | Freq: Three times a day (TID) | ORAL | Status: DC | PRN

## 2017-04-15 MED ORDER — PANTOPRAZOLE SODIUM 40 MG PO TBEC: 40.0000 mg | DELAYED_RELEASE_TABLET | Freq: Every day | ORAL | Status: DC

## 2017-04-15 MED ORDER — TOPIRAMATE 25 MG PO TABS
50.0000 mg | ORAL_TABLET | Freq: Two times a day (BID) | ORAL | Status: DC
  Administered 2017-04-15 – 2017-04-18 (×6): 50 mg via ORAL
  Filled 2017-04-15 (×6): qty 2

## 2017-04-15 MED ORDER — ETOMIDATE 2 MG/ML IV SOLN
10.0000 mg | Freq: Once | INTRAVENOUS | Status: DC
  Filled 2017-04-15: qty 10

## 2017-04-15 MED ORDER — ONDANSETRON HCL 4 MG/2ML IJ SOLN: 4.0000 mg | Freq: Four times a day (QID) | INTRAMUSCULAR | Status: DC | PRN

## 2017-04-15 MED ORDER — FUROSEMIDE 80 MG PO TABS
80.0000 mg | ORAL_TABLET | Freq: Two times a day (BID) | ORAL | Status: DC
  Administered 2017-04-15 – 2017-04-18 (×6): 80 mg via ORAL
  Filled 2017-04-15 (×6): qty 1

## 2017-04-15 MED ORDER — SODIUM CHLORIDE 0.9% FLUSH: 3.0000 mL | INTRAVENOUS | Status: DC | PRN

## 2017-04-15 MED ORDER — NITROGLYCERIN 0.4 MG SL SUBL
0.4000 mg | SUBLINGUAL_TABLET | SUBLINGUAL | Status: DC | PRN
Start: 2017-04-15 — End: 2017-04-18

## 2017-04-15 MED ORDER — RIVAROXABAN 20 MG PO TABS
20.0000 mg | ORAL_TABLET | Freq: Every day | ORAL | Status: DC
  Administered 2017-04-15 – 2017-04-17 (×3): 20 mg via ORAL
  Filled 2017-04-15 (×4): qty 1

## 2017-04-15 MED ORDER — LOSARTAN POTASSIUM 25 MG PO TABS
25.0000 mg | ORAL_TABLET | Freq: Every day | ORAL | Status: DC
  Administered 2017-04-16 – 2017-04-18 (×3): 25 mg via ORAL
  Filled 2017-04-15 (×3): qty 1

## 2017-04-15 MED ORDER — ACETAMINOPHEN 500 MG PO TABS: 1000.0000 mg | ORAL_TABLET | Freq: Three times a day (TID) | ORAL | Status: DC | PRN

## 2017-04-15 MED ORDER — SODIUM CHLORIDE 0.9% FLUSH
3.0000 mL | Freq: Two times a day (BID) | INTRAVENOUS | Status: DC
  Administered 2017-04-15 – 2017-04-18 (×5): 3 mL via INTRAVENOUS

## 2017-04-15 MED ORDER — ATORVASTATIN CALCIUM 10 MG PO TABS
5.0000 mg | ORAL_TABLET | Freq: Every day | ORAL | Status: DC
  Administered 2017-04-15 – 2017-04-17 (×3): 5 mg via ORAL
  Filled 2017-04-15 (×3): qty 1

## 2017-04-15 MED ORDER — ALBUTEROL SULFATE (2.5 MG/3ML) 0.083% IN NEBU: 3.0000 mL | INHALATION_SOLUTION | RESPIRATORY_TRACT | Status: DC | PRN

## 2017-04-15 MED ORDER — MIDAZOLAM HCL 2 MG/2ML IJ SOLN
2.0000 mg | Freq: Once | INTRAMUSCULAR | Status: DC
  Filled 2017-04-15: qty 2

## 2017-04-15 MED ORDER — MONTELUKAST SODIUM 10 MG PO TABS
10.0000 mg | ORAL_TABLET | Freq: Every day | ORAL | Status: DC
  Administered 2017-04-15 – 2017-04-17 (×3): 10 mg via ORAL
  Filled 2017-04-15 (×3): qty 1

## 2017-04-15 MED ORDER — HYDROCERIN EX CREA
1.0000 "application " | TOPICAL_CREAM | CUTANEOUS | Status: DC | PRN
  Filled 2017-04-15: qty 113

## 2017-04-15 MED ORDER — POTASSIUM CHLORIDE CRYS ER 20 MEQ PO TBCR
40.0000 meq | EXTENDED_RELEASE_TABLET | Freq: Two times a day (BID) | ORAL | Status: DC
  Administered 2017-04-16 – 2017-04-18 (×5): 40 meq via ORAL
  Filled 2017-04-15 (×5): qty 2

## 2017-04-15 MED ORDER — MIDAZOLAM HCL 2 MG/2ML IJ SOLN
INTRAMUSCULAR | Status: AC | PRN
  Administered 2017-04-15: 1 mg via INTRAVENOUS

## 2017-04-15 MED ORDER — MOMETASONE FURO-FORMOTEROL FUM 100-5 MCG/ACT IN AERO
2.0000 | INHALATION_SPRAY | Freq: Two times a day (BID) | RESPIRATORY_TRACT | Status: DC
  Filled 2017-04-15: qty 8.8

## 2017-04-15 MED ORDER — DOFETILIDE 500 MCG PO CAPS
500.0000 ug | ORAL_CAPSULE | Freq: Two times a day (BID) | ORAL | Status: DC
  Administered 2017-04-15 – 2017-04-16 (×2): 500 ug via ORAL
  Filled 2017-04-15 (×3): qty 1

## 2017-04-15 MED ORDER — METOPROLOL TARTRATE 25 MG PO TABS
25.0000 mg | ORAL_TABLET | Freq: Two times a day (BID) | ORAL | Status: DC
  Administered 2017-04-15 – 2017-04-18 (×6): 25 mg via ORAL
  Filled 2017-04-15 (×6): qty 1

## 2017-04-15 MED ORDER — METOPROLOL TARTRATE 5 MG/5ML IV SOLN
2.5000 mg | Freq: Once | INTRAVENOUS | Status: AC
  Administered 2017-04-15: 2.5 mg via INTRAVENOUS
  Filled 2017-04-15: qty 5

## 2017-04-15 MED ORDER — FUROSEMIDE 10 MG/ML IJ SOLN
40.0000 mg | Freq: Once | INTRAMUSCULAR | Status: AC
  Administered 2017-04-15: 40 mg via INTRAVENOUS
  Filled 2017-04-15: qty 4

## 2017-04-15 NOTE — ED Provider Notes (Signed)
  Physical Exam  BP 105/68   Pulse 64   Temp 97.8 F (36.6 C) (Oral)   Resp 17   Ht 5\' 7"  (1.702 m)   Wt 124.7 kg (275 lb)   SpO2 96%   BMI 43.07 kg/m   Physical Exam Presedation patient is alert and appropriate.  He is aware of procedure and understands for consent.  Mental status clear Airway patent Heart irregularly irregular Breath sounds symmetric ED Course/Procedures     .Sedation Date/Time: 04/15/2017 12:19 PM Performed by: Charlesetta Shanks, MD Authorized by: Charlesetta Shanks, MD   Consent:    Consent obtained:  Written   Consent given by:  Patient   Risks discussed:  Allergic reaction, dysrhythmia, inadequate sedation, nausea, prolonged hypoxia resulting in organ damage, prolonged sedation necessitating reversal, respiratory compromise necessitating ventilatory assistance and intubation and vomiting Universal protocol:    Immediately prior to procedure a time out was called: yes   Indications:    Procedure performed:  Cardioversion   Procedure necessitating sedation performed by:  Different physician   Intended level of sedation:  Moderate (conscious sedation) Pre-sedation assessment:    Time since last food or drink:  MIDNIGHT   ASA classification: class 3 - patient with severe systemic disease     Neck mobility: normal     Mouth opening:  3 or more finger widths   Thyromental distance:  3 finger widths   Mallampati score:  II - soft palate, uvula, fauces visible   Pre-sedation assessments completed and reviewed: airway patency, cardiovascular function, hydration status, mental status, nausea/vomiting, pain level and respiratory function     Pre-sedation assessment completed:  04/15/2017 11:00 AM Procedure details (see MAR for exact dosages):    Preoxygenation:  Nasal cannula   Sedation:  Midazolam and etomidate   Intra-procedure monitoring:  Blood pressure monitoring, cardiac monitor, continuous capnometry, continuous pulse oximetry, frequent LOC assessments and  frequent vital sign checks   Intra-procedure events: none     Intra-procedure management:  Airway repositioning and airway suctioning   Total Provider sedation time (minutes):  20 Post-procedure details:    Post-sedation assessment completed:  04/15/2017 12:21 PM   Attendance: Constant attendance by certified staff until patient recovered     Recovery: Patient returned to pre-procedure baseline     Post-sedation assessments completed and reviewed: airway patency, cardiovascular function, hydration status, mental status, nausea/vomiting, pain level and respiratory function     Patient is stable for discharge or admission: yes     Patient tolerance:  Tolerated well, no immediate complications  Cardioversion was performed by Dr. Sallyanne Kuster  MDM    Patient tolerated the procedure well.  He has been reassessed and is oriented to person place and time.  Vital signs stable.     Charlesetta Shanks, MD 04/15/17 1225

## 2017-04-15 NOTE — ED Triage Notes (Signed)
Pt. Stated it  Started yesterday with jaw pain and heartbeat up and down and SOB anytime AI did anything.

## 2017-04-15 NOTE — Progress Notes (Signed)
Pharmacy Review for Dofetilide (Tikosyn) Initiation  Admit Complaint: 77 y.o. male admitted 04/15/2017 with atrial fibrillation to be initiated on dofetilide.   Assessment:  Patient Exclusion Criteria: If any screening criteria checked as "Yes", then  patient  should NOT receive dofetilide until criteria item is corrected. If "Yes" please indicate correction plan.  YES  NO Patient  Exclusion Criteria Correction Plan  []  [x]  Baseline QTc interval is greater than or equal to 440 msec. IF above YES box checked dofetilide contraindicated unless patient has ICD; then may proceed if QTc 500-550 msec or with known ventricular conduction abnormalities may proceed with QTc 550-600 msec. QTc = 461ms   []  [x]  Magnesium level is less than 1.8 mEq/l : Last magnesium:  Lab Results  Component Value Date   MG 2.2 04/15/2017         [x]  []  Potassium level is less than 4 mEq/l : Last potassium:  Lab Results  Component Value Date   K 3.6 04/15/2017       KCL 62mEq PO x 2 doses, BMET in AM  []  [x]  Patient is known or suspected to have a digoxin level greater than 2 ng/ml: No results found for: DIGOXIN    []  [x]  Creatinine clearance less than 20 ml/min (calculated using Cockcroft-Gault, actual body weight and serum creatinine): Estimated Creatinine Clearance: 67.3 mL/min (by C-G formula based on SCr of 1.2 mg/dL).    []  [x]  Patient has received drugs known to prolong the QT intervals within the last 48 hours (phenothiazines, tricyclics or tetracyclic antidepressants, erythromycin, H-1 antihistamines, cisapride, fluoroquinolones, azithromycin). Drugs not listed above may have an, as yet, undetected potential to prolong the QT interval, updated information on QT prolonging agents is available at this website:QT prolonging agents   []  [x]  Patient received a dose of hydrochlorothiazide (Oretic) alone or in any combination including triamterene (Dyazide, Maxzide) in the last 48 hours.   []  [x]  Patient  received a medication known to increase dofetilide plasma concentrations prior to initial dofetilide dose:  . Trimethoprim (Primsol, Proloprim) in the last 36 hours . Verapamil (Calan, Verelan) in the last 36 hours or a sustained release dose in the last 72 hours . Megestrol (Megace) in the last 5 days  . Cimetidine (Tagamet) in the last 6 hours . Ketoconazole (Nizoral) in the last 24 hours . Itraconazole (Sporanox) in the last 48 hours  . Prochlorperazine (Compazine) in the last 36 hours    []  [x]  Patient is known to have a history of torsades de pointes; congenital or acquired long QT syndromes.   []  [x]  Patient has received a Class 1 antiarrhythmic with less than 2 half-lives since last dose. (Disopyramide, Quinidine, Procainamide, Lidocaine, Mexiletine, Flecainide, Propafenone)   []  [x]  Patient has received amiodarone therapy in the past 3 months or amiodarone level is greater than 0.3 ng/ml.    Patient has been appropriately anticoagulated with Xarelto.  Ordering provider was confirmed at [email or web address] if they are not listed on the [facility] Prescribers list.  Goal of Therapy: Follow renal function, electrolytes, potential drug interactions, and dose adjustment. Provide education and 1 week supply at discharge.  Plan:  []   Physician selected initial dose within range recommended for patients level of renal function - will monitor for response.  [x]   Physician selected initial dose outside of range recommended for patients level of renal function - will discuss if the dose should be altered at this time.   Select One Calculated CrCl  Dose q12h  [  x] > 60 ml/min 500 mcg  []  40-60 ml/min 250 mcg  []  20-40 ml/min 125 mcg   2. Follow up QTc after the first 5 doses, renal function, electrolytes (K & Mg) daily x 3     days, dose adjustment, success of initiation and facilitate 1 week discharge supply as     clinically indicated.  3. Initiate Tikosyn education video (Call  [phone] and ask for Tikosyn Video # 116).  4. Place Enrollment Form on the chart for discharge supply of dofetilide.   Marcina Kinnison D. Mina Marble, PharmD, BCPS Pager:  [phone] 04/15/2017, 3:18 PM

## 2017-04-15 NOTE — ED Provider Notes (Addendum)
Medical screening examination/treatment/procedure(s) were conducted as a shared visit with non-physician practitioner(s) and myself.  I personally evaluated the patient during the encounter.   EKG Interpretation  Date/Time:  Sunday April 15 2017 07:35:19 EDT Ventricular Rate:  131 PR Interval:    QRS Duration: 96 QT Interval:  324 QTC Calculation: 478 R Axis:   70 Text Interpretation:  Atrial flutter with 2:1 A-V conduction Incomplete right bundle branch block ST & T wave abnormality, consider inferolateral ischemia Abnormal ECG QRS morphology unchanged from previous. rate related T wave inversions. Flutter vs Sinus Tach Confirmed by Charlesetta Shanks [phone]) on 04/15/2017 8:38:16 AM     Ports his symptoms started about 2 AM in the morning.  He awakened feeling short of breath and like his heart was racing and pounding.  Patient reports he had some pressure on his chest and got a little aching in his jaw.  The symptoms continued through most of the night.  He was up and down.  He reports at about 5 he used his monitor to check his heart rate and it was in the 130s.  He reports he did take his morning metoprolol dose but his heart rate continued to go up into the 100s.  He reports he still feels some pressure on his chest.  Patient is alert and appropriate.  No respiratory distress at rest.  Lungs with soft breath sounds.  No gross wheeze or rale.  Heart irregularly irregular.  Tachycardic.  Patient presents with atrial fibrillation RVR.  Patient has prior history of atrial fibrillation and takes Xarelto.  He is compliant with medications.  He reports usually his A. fib is for short periods of time and returns to sinus.  This has persisted since the early mornings of the hour and patient has associated chest pressure and shortness of breath.  He is mentally alert and does not have respiratory distress at rest.  Patient's initial EKG consistent with atrial flutter rate of 130.  Rate then decreased to  around 100 atrial fib.  Will use Lopressor as tolerated by BP for rate control and start Lasix for CHF identified on chest x-ray and consistent with patient's dyspnea overnight.  At this time, patient is clinically stable in terms of respiratory status and rate controlled around 90-100.  Does not have immediate indication for emergent cardioversion.  First troponin negative.  PA-C to consult cardiology.   CRITICAL CARE Performed by: Si Gaul   Total critical care time: 30  minutes  Critical care time was exclusive of separately billable procedures and treating other patients.  Critical care was necessary to treat or prevent imminent or life-threatening deterioration.  Critical care was time spent personally by me on the following activities: development of treatment plan with patient and/or surrogate as well as nursing, discussions with consultants, evaluation of patient's response to treatment, examination of patient, obtaining history from patient or surrogate, ordering and performing treatments and interventions, ordering and review of laboratory studies, ordering and review of radiographic studies, pulse oximetry and re-evaluation of patient's condition.   Charlesetta Shanks, MD 04/15/17 1036  After Dr.Croitoru reassessed the patient, wished to proceed with cardioversion.  Presedation assessment performed and consent obtained.  See separate note for my sedation procedure.  Dr. Sallyanne Kuster performed cardioversion at bedside.    Charlesetta Shanks, MD 04/15/17 1228

## 2017-04-15 NOTE — CV Procedure (Signed)
Procedure: Electrical Cardioversion Indications:  Atrial Fibrillation  Procedure Details:  Consent: Risks of procedure as well as the alternatives and risks of each were explained to the (patient/caregiver).  Consent for procedure obtained.  Time Out: Verified patient identification, verified procedure, site/side was marked, verified correct patient position, special equipment/implants available, medications/allergies/relevent history reviewed, required imaging and test results available.  Performed  Patient placed on cardiac monitor, pulse oximetry, supplemental oxygen as necessary.  Sedation given: Etomidate, Dr. Johnney Killian Pacer pads placed anterior chest.  Cardioverted 2 time(s).  Cardioversion with synchronized biphasic 120J shock (unsuccessful) and then 200J shock.  Evaluation: Findings: Post procedure EKG shows: NSR  With frequent PACs. QTc 009 ms Complications: None Patient did tolerate procedure well.  Time Spent Directly with the Patient:  30 minutes   Ronie Barnhart 04/15/2017, 12:26 PM

## 2017-04-15 NOTE — H&P (Signed)
Cardiology Admission History and Physical:   Patient ID: CON ARGANBRIGHT; MRN: [id number]; DOB: [birth year]   Admission date: 04/15/2017  Primary Care Provider: Josetta Huddle, MD Primary Cardiologist: Peter Martinique, MD Primary Electrophysiologist:  n/a  Chief Complaint: Shortness of breath and rapid palpitations  Patient Profile:   Carlos Dawson is a 77 y.o. male with a history of paroxysmal atrial fibrillation requiring cardioversion in August 2018, moderate to large ascending aortic aneurysm, coronary artery disease (2012 bare-metal stent to OM 2), diabetes mellitus, chronic diastolic heart failure, hypertension, obstructive sleep apnea and remote history of acute pulmonary embolism, presenting with symptomatic atrial fibrillation rapid ventricular response.  History of Present Illness:   Carlos Dawson is a 77 y.o. male with a history of paroxysmal atrial fibrillation requiring cardioversion in August 2018, moderate to large ascending aortic aneurysm, coronary artery disease (2012 bare-metal stent to OM 2), diabetes mellitus, chronic diastolic heart failure, hypertension, obstructive sleep apnea and remote history of acute pulmonary embolism.  He woke from sleep around 2 AM with rapid palpitations and shortness of breath that slowly worsened.  He did not have angina.  He presented to the emergency room with atrial fibrillation rapid ventricular response.  He has had some improvement in symptoms with rate control, but remains short of breath and is still aware of palpitations with a heart rate around 90.  He has not had any recent respiratory illnesses or cardiac complaints.  He felt pretty good the day before these events.  He denies exertional angina, but has chronic NYHA functional class II exertional dyspnea.  He has been compliant with daily oral anticoagulant and has not had any bleeding complications.  Palpitations have occurred occasionally in the 8 months since his previous  presentation with atrial fibrillation.  They usually last for about 35-40 minutes and resolved spontaneously and are not associated with shortness of breath as they are today.  His has had a very recent functional studies in February 2019 with a [employer] with a small inferoapical reversible defect and EF of 61% and an echocardiogram with normal left ventricular systolic and "normal" diastolic function (although in my opinion there is clear evidence of diastolic dysfunction with elevated filling pressures), very mild aortic stenosis, and moderate pulmonary hypertension, PA pressure 54 mmHg).  The left atrium is described as normal in size, but the right atrium is mildly dilated.  Due to the abnormality on the nuclear stress test he underwent cardiac catheterization that showed no significant coronary stenoses, a widely patent old stent in the OM 2 and confirm the presence of his aneurysmal thoracic aorta.  Left ventricular end-diastolic pressure was not recorded.   Past Medical History:  Diagnosis Date  . Anticoagulant long-term use   . Aortic root enlargement ([place])   . Ascending aortic aneurysm [facility])    recent scan in October 2012 showing no change; followed by Dr. Servando Snare  . ASCVD (arteriosclerotic cardiovascular disease)    Prior BMS to the 2nd OM in September 2012; with repeat cath in October showing patency  . CAD (coronary artery disease)    a. s/p BMS to 2nd OM in Sept 2012; b. LexiScan Myoview (12/2012):  Inf infarct; bowel and motion artifact make study difficult to interpret; no ischemia; not gated; Low Risk  . CHF (congestive heart failure) ([place])    no recent issues 10/13/14  . Colonic polyp   . Contact lens/glasses fitting   . Diabetes mellitus without complication (HCC)    metphormin, average 154 dx  7341  . Diastolic dysfunction   . Generalized headaches    neck stenosis  . GERD (gastroesophageal reflux disease)   . Hearing loss   . Hearing loss    more so on left  .  Hemorrhoids   . Hypertension   . IBS (irritable bowel syndrome)   . LVH (left ventricular hypertrophy)   . Mild intermittent asthma   . OA (osteoarthritis)   . Obesities, morbid ([place])   . OSA (obstructive sleep apnea)    PSG 03/30/97 AHI 21, BPAP 13/9  . PAF (paroxysmal atrial fibrillation) ([place])    a. on Xarelto b. s/p DCCV in 08/2016  . Pneumonia   . Prostate CA [facility])    Oncologist  DR. Daralene Milch [facility] 09/24/14, undetermined tx   prostate  . Pulmonary embolism ([place])    2008  . Sleep apnea   . SOB (shortness of breath)    on excertion  . Thoracic aortic aneurysm (HCC)    Aortic Size Index=     5.0    /Body surface area is 2.43 meters squared. = 2.05  < 2.75 cm/m2      4% risk per year 2.75 to 4.25          8% risk per year > 4.25 cm/m2    20% risk per year   Stable aneurysmal dilation of the ascending aorta with maximum AP diameter of 4.8 cm. Stable area of narrowing of the proximal most portion of the descending aorta measuring 2 cm., previously identified as an area of coarctation. No evidence of aortic dissection.  Coronary artery disease.  Normal appearance of the lungs.   Electronically Signed   By: Fidela Salisbury M.D.   On: 10/01/2014 08:50      Past Surgical History:  Procedure Laterality Date  . ACHILLES TENDON REPAIR    . AORTIC ARCH ANGIOGRAPHY N/A 03/13/2017   Procedure: AORTIC ARCH ANGIOGRAPHY;  Surgeon: Martinique, [name], MD;  Location: [place] CV LAB;  Service: Cardiovascular;  Laterality: N/A;  . APPENDECTOMY    . BACK SURGERY    . CARDIAC CATHETERIZATION  2006  . CARDIAC CATHETERIZATION  October 2012   Stent patent  . CARPAL TUNNEL RELEASE     LEFT  . CERVICAL SPINE SURGERY  06/02/2010   lower back and neck  . CHOLECYSTECTOMY    . COLONOSCOPY WITH PROPOFOL N/A 12/29/2014   Procedure: COLONOSCOPY WITH PROPOFOL;  Surgeon: Garlan Fair, MD;  Location: [facility] ENDOS[facility];  Service: Endoscopy;  Laterality: N/A;  . CORONARY STENT PLACEMENT  Sept 2012   2nd OM  with BMS  . EYE SURGERY     bilateral cataract  . HEMORROIDECTOMY    . LAMINECTOMY  05/30/2012   L 4 L5  . LAMINECTOMY WITH POSTERIOR LATERAL ARTHRODESIS LEVEL 3 N/A 10/18/2016   Procedure: Posterior Lateral Fusion - Lumbar One-Four, segmental instrumentation Lumbar One-Five,  decompression,;  Surgeon: Eustace Moore, MD;  Location: [facility] OR;  Service: Neurosurgery;  Laterality: N/A;  . LAPAROSCOPIC GASTRIC BANDING    . LEFT AND RIGHT HEART CATHETERIZATION WITH CORONARY ANGIOGRAM N/A 05/07/2014   Procedure: LEFT AND RIGHT HEART CATHETERIZATION WITH CORONARY ANGIOGRAM;  Surgeon: [name] Martinique, MD;  Location: [facility] C[facility] LAB;  Service: Cardiovascular;  Laterality: N/A;  . LEFT HEART CATH AND CORONARY ANGIOGRAPHY N/A 03/13/2017   Procedure: LEFT HEART CATH AND CORONARY ANGIOGRAPHY;  Surgeon: Martinique, [name], MD;  Location: [place] CV LAB;  Service: Cardiovascular;  Laterality: N/A;  .  LUMBAR LAMINECTOMY/DECOMPRESSION MICRODISCECTOMY N/A 05/04/2016   Procedure: Laminectomy and Foraminotomy - Thoracic twelve-Lumbar one -Posterior Fusion Lumbar one-two;  Surgeon: Eustace Moore, MD;  Location: [place];  Service: Neurosurgery;  Laterality: N/A;  . POSTERIOR LUMBAR FUSION  10/18/2016  . ROTATOR CUFF REPAIR     both  . TONSILLECTOMY    . TRIGGER FINGER RELEASE     LEFT  . UVULOPALATOPHARYNGOPLASTY    . VASECTOMY       Medications Prior to Admission: Prior to Admission medications   Medication Sig Start Date End Date Taking? Authorizing Provider  acetaminophen (TYLENOL) 500 MG tablet Take 1,000 mg by mouth every 8 (eight) hours as needed for mild pain or headache.     [provider]  albuterol (PROVENTIL HFA;VENTOLIN HFA) 108 (90 Base) MCG/ACT inhaler Inhale 2 puffs into the lungs every 4 (four) hours as needed for wheezing or shortness of breath.    [provider]  atorvastatin (LIPITOR) 10 MG tablet Take 5 mg by mouth daily at 6 PM.     [provider]  augmented  betamethasone dipropionate (DIPROLENE-AF) 0.05 % cream Apply 1 application topically 2 (two) times daily as needed for itching. 01/31/17   [provider]  budesonide-formoterol (SYMBICORT) 80-4.5 MCG/ACT inhaler Inhale 2 puffs into the lungs 2 (two) times daily.     [provider]  Cholecalciferol (VITAMIN D-3) 5000 units TABS Take 5,000 Units by mouth daily.    [provider]  dicyclomine (BENTYL) 20 MG tablet Take 20 mg by mouth 2 (two) times daily as needed for spasms.     [provider]  furosemide (LASIX) 40 MG tablet Take 80 mg by mouth 2 (two) times daily.    [provider]  KLOR-CON M20 20 MEQ tablet TAKE 2 TABLETS BY MOUTH TWICE A DAY Patient taking differently: Take 40 MEQ by mouth twice daily 01/17/17   Martinique, [name], MD  losartan (COZAAR) 25 MG tablet TAKE 1 TABLET BY MOUTH EVERY DAY Patient taking differently: Take 25 mg by mouth once daily 11/06/16   Martinique, [name], MD  methocarbamol (ROBAXIN) 500 MG tablet Take 1 tablet (500 mg total) by mouth every 8 (eight) hours as needed for muscle spasms. 10/20/16   Eustace Moore, MD  metoprolol tartrate (LOPRESSOR) 25 MG tablet TAKE 1 TABLET BY MOUTH TWICE A DAY.TAKE EXTRA 1/2 TAB IF SBP>110 Patient taking differently: Take 25 mg by mouth 2 (two) times daily. TAKE EXTRA 12.5 MG IF SBP>110 03/01/17   Lendon Colonel, NP  montelukast (SINGULAIR) 10 MG tablet Take 10 mg by mouth at bedtime.    [provider]  nitroGLYCERIN (NITROSTAT) 0.4 MG SL tablet Place 1 tablet (0.4 mg total) under the tongue every 5 (five) minutes as needed for chest pain. 09/15/16   Delos Haring, PA-C  pantoprazole (PROTONIX) 40 MG tablet Take 40 mg by mouth daily.    [provider]  Polyethyl Glycol-Propyl Glycol (SYSTANE OP) Place 1 drop into both eyes daily as needed (for dry eyes).     [provider]  psyllium (METAMUCIL) 58.6 % packet Take 1 packet by mouth daily.    [provider]  rivaroxaban (XARELTO) 20 MG TABS tablet Take 1 tablet (20 mg total) by mouth daily with supper. 03/14/17   Martinique, [name], MD  sildenafil (VIAGRA) 100 MG tablet Take 100 mg by mouth as needed for erectile dysfunction.  05/23/10   [provider]  Skin Protectants,  Misc. (EUCERIN) cream Apply 1 application topically as needed for dry skin.    [provider]  spironolactone (ALDACTONE) 25 MG tablet Take 25 mg by mouth daily.    [provider]  tamsulosin (FLOMAX) 0.4 MG CAPS Take 0.4 mg by mouth every evening.     [provider]  topiramate (TOPAMAX) 25 MG capsule Take 50 mg by mouth 2 (two) times daily.     [provider]  traMADol (ULTRAM) 50 MG tablet Take 50 mg by mouth every 12 (twelve) hours as needed for moderate pain.  09/22/14   [provider]     Allergies:    Allergies  Allergen Reactions  . Adhesive [Tape] Itching and Rash  . Latex Itching, Rash and Other (See Comments)    Bandaids  . Morphine Itching    Social History:   Social History   Socioeconomic History  . Marital status: Married    Spouse name: Not on file  . Number of children: 3  . Years of education: Not on file  . Highest education level: Not on file  Occupational History  . Occupation: Retired from Scientist, clinical (histocompatibility and immunogenetics): RETIRED  Social Needs  . Financial resource strain: Not on file  . Food insecurity:    Worry: Not on file    Inability: Not on file  . Transportation needs:    Medical: Not on file    Non-medical: Not on file  Tobacco Use  . Smoking status: Former Smoker    Packs/day: 1.50    Years: 30.00    Pack years: 45.00    Last attempt to quit: 01/24/1992    Years since quitting: 25.2  . Smokeless tobacco: Never Used  . Tobacco comment: Stopped in 1983  Substance and Sexual Activity  . Alcohol use: No  . Drug use: No  . Sexual activity: Yes  Lifestyle  . Physical activity:    Days per week: Not on file    Minutes per  session: Not on file  . Stress: Not on file  Relationships  . Social connections:    Talks on phone: Not on file    Gets together: Not on file    Attends religious service: Not on file    Active member of club or organization: Not on file    Attends meetings of clubs or organizations: Not on file    Relationship status: Not on file  . Intimate partner violence:    Fear of current or ex partner: Not on file    Emotionally abused: Not on file    Physically abused: Not on file    Forced sexual activity: Not on file  Other Topics Concern  . Not on file  Social History Narrative  . Not on file    Family History:   The patient's family history includes Diabetes in his mother; Emphysema (age of onset: 61) in his father; Heart attack in his sister; Heart disease in his mother; Other in his mother.    ROS:  Please see the history of present illness.  All other ROS reviewed and negative.     Physical Exam/Data:   Vitals:   04/15/17 0745 04/15/17 0838 04/15/17 0948 04/15/17 1017  BP: 95/82 127/76 106/63 111/61  Pulse: (!) 129 95 98 88  Resp:  18 18 18   Temp: 97.8 F (36.6 C)     TempSrc: Oral     SpO2: 97% 96% 97% 96%  Weight:  Height:       No intake or output data in the 24 hours ending 04/15/17 1136 Filed Weights   04/15/17 0741  Weight: 275 lb (124.7 kg)   Body mass index is 43.07 kg/m.  General:  Well nourished, well developed, in no acute distress HEENT: normal Lymph: no adenopathy Neck: no clear evidence of JVD, but exam is limited by obesity Endocrine:  No thryomegaly Vascular: No carotid bruits; FA pulses 2+ bilaterally without bruits  Cardiac:  normal S1, S2; irregular; 1/6 early peaking systolic ejection murmur in the aortic focus, no diastolic Lungs:  clear to auscultation bilaterally, no wheezing, rhonchi or rales  Abd: soft, nontender, no hepatomegaly  Ext: no edema Musculoskeletal:  No deformities, BUE and BLE strength normal and equal Skin: warm and  dry  Neuro:  CNs 2-12 intact, no focal abnormalities noted Psych:  Normal affect    EKG:  The ECG that was done  was personally reviewed and demonstrates atrial fibrillation with very coarse fibrillatory waves, almost resembling atrial flutter with 2: 1 AV block, but irregular.  QTc recorded as 478 ms but this is likely an overestimation due to the prominent flutter-like atrial activity.  ECG on February 7 showed mild sinus bradycardia at 57 bpm with a normal QRS complex and normal repolarization, QT interval 432 ms  Relevant CV Studies: reviewed recent echo, nuclear stress test, recent coronary angiogram  Laboratory Data:  Chemistry Recent Labs  Lab 04/15/17 0743  NA 138  K 3.9  CL 107  CO2 21*  GLUCOSE 181*  BUN 19  CREATININE 1.20  CALCIUM 8.8*  GFRNONAA 57*  GFRAA >60  ANIONGAP 10    No results for input(s): PROT, ALBUMIN, AST, ALT, ALKPHOS, BILITOT in the last 168 hours. Hematology Recent Labs  Lab 04/15/17 0743  WBC 6.4  RBC 4.71  HGB 12.0*  HCT 38.9*  MCV 82.6  MCH 25.5*  MCHC 30.8  RDW 23.2*  PLT 178   Cardiac EnzymesNo results for input(s): TROPONINI in the last 168 hours.  Recent Labs  Lab 04/15/17 0759 04/15/17 1051  TROPIPOC 0.00 0.00    BNPNo results for input(s): BNP, PROBNP in the last 168 hours.  DDimer No results for input(s): DDIMER in the last 168 hours.  Radiology/Studies:  Dg Chest 2 View  Result Date: 04/15/2017 CLINICAL DATA:  Chest pain, shortness of breath and rapid heart palpitations last night and this morning, history coronary artery disease, diabetes mellitus, hypertension, prostate cancer, thoracic aortic aneurysm EXAM: CHEST - 2 VIEW COMPARISON:  10/16/2016 FINDINGS: Enlargement of cardiac silhouette. Mediastinal contours and pulmonary vascularity normal. Pulmonary vascular congestion. Bibasilar atelectasis. No acute infiltrate, pleural effusion or pneumothorax. Scattered endplate spur formation thoracic spine. IMPRESSION:  Enlargement of cardiac silhouette with pulmonary vascular congestion. Bibasilar atelectasis. Electronically Signed   By: Lavonia Dana M.D.   On: 04/15/2017 09:39    Assessment and Plan:   1. AFib: Symptomatic atrial fibrillation, despite what is now adequate rate control.  Note that on current dose of rate control medications his resting heart rate was in the 50s when he was in normal rhythm, but he presented with a ventricular rate of 140 when in atrial fibrillation.  I think he would be best served by early cardioversion.  He has been compliant with anticoagulation for the last 4-5 weeks since his cardiac catheterization.  Discussed with Dr. Johnney Killian in the emergency room and we plan to do the cardioversion later this morning.  This procedure has been  fully reviewed with the patient and written informed consent has been obtained. However, this is his second symptomatic event in the last 6 months and I think he will benefit from antiarrhythmic therapy.  Since he does have coronary artery disease and acute diastolic heart failure as well as previously documented bradycardia, the appropriate medication list is short, pretty much limited to amiodarone (and discontinuation of the beta-blocker) and dofetilide.  QT interval was long on his most recent ECG, but this is an exaggeration due to the flutter-like atrial activity.  If after cardioversion his QT is normal, is as expected will be, dofetilide seems to be the best choice.  This is discussed with Mr. Mrs. Fallin and they understand that initiation of this antiarrhythmic requires a mandatory 72-hour in hospital admission with observation on telemetry. 2. CHF: Mild heart failure exacerbation likely related to atrial fibrillation with RVR, may still benefit from additional diuresis after cardioversion.  His echocardiogram in February showed elevated E/e' ratio consistent with elevated filling pressures, despite preserved left ventricular systolic function 3. CAD:  With recent angiogram without evidence of new stenoses.  Asymptomatic. 4. OSA: Difficult to know the relative contribution of obstructive sleep apnea versus diastolic heart failure to his pulmonary hypertension.  Notes that he had a dilated right atrium suggesting chronic right heart overload.  Sleep apnea may actually be the dominant driver of his atrial fibrillation and compliance with CPAP is important. 5. DM: Fair if not perfect control with most recent A1c 7.4% 6. HTN: Blood pressure is quite soft right now, does not allow higher doses of rate control medications (rate control medications are also limited by relative bradycardia when in normal rhythm). 7. Chronic anticoagulation: Compliant with medications.  No bleeding complications.  Note that he also has a history of remote acute pulmonary embolism in 2008.  Severity of Illness: The appropriate patient status for this patient is INPATIENT. Inpatient status is judged to be reasonable and necessary in order to provide the required intensity of service to ensure the patient's safety. The patient's presenting symptoms, physical exam findings, and initial radiographic and laboratory data in the context of their chronic comorbidities is felt to place them at high risk for further clinical deterioration. Furthermore, it is not anticipated that the patient will be medically stable for discharge from the hospital within 2 midnights of admission. The following factors support the patient status of inpatient.   " The patient's presenting symptoms include shortness of breath at rest, rapid palpitations. " The worrisome physical exam findings include acute congestive heart failure, atrial fibrillation rapid ventricular response. " The initial radiographic and laboratory data are worrisome because of congestive heart failure on chest x-ray. " The chronic co-morbidities include diabetes mellitus, coronary artery disease, obstructive sleep apnea.   * I  certify that at the point of admission it is my clinical judgment that the patient will require inpatient hospital care spanning beyond 2 midnights from the point of admission due to high intensity of service, high risk for further deterioration and high frequency of surveillance required.*    For questions or updates, please contact [place] Please consult [email or web address] for contact info under Cardiology/STEMI.    Signed, Sanda Klein, MD  04/15/2017 11:36 AM

## 2017-04-15 NOTE — ED Provider Notes (Signed)
[place] EMERGENCY DEPARTMENT Provider Note   CSN: [id number] Arrival date & time: 04/15/17  9390   History   Chief Complaint Chief Complaint  Patient presents with  . Chest Pain  . Palpitations  . Shortness of Breath    HPI Carlos Dawson is a 77 y.o. male with a hx of ascending aortic aneurysm, HF, DM, HTN, asthma, OSA, PAF anticoagulated on Xarelto, pneumonia, pulmonary embolism, CAD s/p stent  who presents to the emergency department today for chest pain and palpitations that started this AM at 01:30.  Patient states he woke from sleep generally not feeling well.  Patient states that he had a sensation of pressure in the central chest, as well as some jaw pain.  States that he felt like he was having palpitations described as his heart racing.  Symptoms have been constant since onset. No specific alleviating/aggravating factors. States that the discomfort and palpitations have eased off since onset.  Reports he has had been having some dyspnea with exertion for the past several weeks, this is being followed by his pulmonologist, no significant change over the past 24 hours.  Denies fever, chills, cough, orthopnea, leg pain/swelling, hemoptysis, recent long travel/trauma/surgery, or abdominal pain.   Patient states that he has a history of paroxysmal atrial fibrillation on Xarelto and metoprolol, states that typically he has atrial fibrillation more commonly in the evening, the atrial fibrillation typically does not persist for this extended period of time.   HPI  Past Medical History:  Diagnosis Date  . Anticoagulant long-term use   . Aortic root enlargement ([place])   . Ascending aortic aneurysm [facility])    recent scan in October 2012 showing no change; followed by Dr. Servando Snare  . ASCVD (arteriosclerotic cardiovascular disease)    Prior BMS to the 2nd OM in September 2012; with repeat cath in October showing patency  . CAD (coronary artery disease)    a. s/p BMS  to 2nd OM in Sept 2012; b. LexiScan Myoview (12/2012):  Inf infarct; bowel and motion artifact make study difficult to interpret; no ischemia; not gated; Low Risk  . CHF (congestive heart failure) ([place])    no recent issues 10/13/14  . Colonic polyp   . Contact lens/glasses fitting   . Diabetes mellitus without complication (HCC)    metphormin, average 154 dx 2017  . Diastolic dysfunction   . Generalized headaches    neck stenosis  . GERD (gastroesophageal reflux disease)   . Hearing loss   . Hearing loss    more so on left  . Hemorrhoids   . Hypertension   . IBS (irritable bowel syndrome)   . LVH (left ventricular hypertrophy)   . Mild intermittent asthma   . OA (osteoarthritis)   . Obesities, morbid ([place])   . OSA (obstructive sleep apnea)    PSG 03/30/97 AHI 21, BPAP 13/9  . PAF (paroxysmal atrial fibrillation) ([place])    a. on Xarelto b. s/p DCCV in 08/2016  . Pneumonia   . Prostate CA [facility])    Oncologist  DR. Daralene Milch [facility] 09/24/14, undetermined tx   prostate  . Pulmonary embolism ([place])    2008  . Sleep apnea   . SOB (shortness of breath)    on excertion  . Thoracic aortic aneurysm (HCC)    Aortic Size Index=     5.0    /Body surface area is 2.43 meters squared. = 2.05  < 2.75 cm/m2      4%  risk per year 2.75 to 4.25          8% risk per year > 4.25 cm/m2    20% risk per year   Stable aneurysmal dilation of the ascending aorta with maximum AP diameter of 4.8 cm. Stable area of narrowing of the proximal most portion of the descending aorta measuring 2 cm., previously identified as an area of coarctation. No evidence of aortic dissection.  Coronary artery disease.  Normal appearance of the lungs.   Electronically Signed   By: Fidela Salisbury M.D.   On: 10/01/2014 08:50      Patient Active Problem List   Diagnosis Date Noted  . SOB (shortness of breath)   . Sleep apnea   . Prostate CA ([place])   . Pneumonia   . PAF (paroxysmal atrial fibrillation) ([place])   . Mild  intermittent asthma   . IBS (irritable bowel syndrome)   . Hearing loss   . GERD (gastroesophageal reflux disease)   . Generalized headaches   . Diastolic dysfunction   . Contact lens/glasses fitting   . Diabetes mellitus without complication ([place])   . Colonic polyp   . CHF (congestive heart failure) ([place])   . ASCVD (arteriosclerotic cardiovascular disease)   . Ascending aortic aneurysm ([place])   . Aortic root enlargement ([place])   . S/P lumbar spinal fusion 10/18/2016  . Chest pain 09/15/2016  . Paroxysmal atrial fibrillation with RVR ([place]) 09/15/2016  . Hypotension 09/15/2016  . S/P lumbar laminectomy 05/04/2016  . Oral candidiasis 03/30/2016  . Pain in right hip 03/29/2016  . Lumbar radiculopathy 03/29/2016  . Post laminectomy syndrome 03/29/2016  . Severe obesity (BMI >= 40) ([place]) 04/26/2015  . Asthma with acute exacerbation 04/22/2015  . CAP (community acquired pneumonia) 08/12/2013  . Encounter for therapeutic drug monitoring 03/03/2013  . Chest pain at rest 01/06/2013  . Chronic diastolic heart failure ([place]) 12/26/2012  . HLD (hyperlipidemia) 12/26/2012  . Allergic rhinitis 12/24/2012  . Long term (current) use of anticoagulants 08/29/2012  . Lapband APL May 2009 10/25/2011  . Hypoxemia 12/08/2010  . Dyspnea 12/08/2010  . Hypertension   . Obesities, morbid ([place])   . Pulmonary embolism ([place])   . Anticoagulant long-term use   . CAD (coronary artery disease)   . Thoracic aortic aneurysm ([place])   . OA (osteoarthritis)   . LVH (left ventricular hypertrophy)   . Paroxysmal atrial fibrillation (HCC)   . Mild persistent asthma, well controlled 10/21/2008  . OSA (obstructive sleep apnea) 08/06/2008    Past Surgical History:  Procedure Laterality Date  . ACHILLES TENDON REPAIR    . AORTIC ARCH ANGIOGRAPHY N/A 03/13/2017   Procedure: AORTIC ARCH ANGIOGRAPHY;  Surgeon: Martinique, [name], MD;  Location: [place] CV LAB;  Service: Cardiovascular;  Laterality: N/A;  .  APPENDECTOMY    . BACK SURGERY    . CARDIAC CATHETERIZATION  2006  . CARDIAC CATHETERIZATION  October 2012   Stent patent  . CARPAL TUNNEL RELEASE     LEFT  . CERVICAL SPINE SURGERY  06/02/2010   lower back and neck  . CHOLECYSTECTOMY    . COLONOSCOPY WITH PROPOFOL N/A 12/29/2014   Procedure: COLONOSCOPY WITH PROPOFOL;  Surgeon: Garlan Fair, MD;  Location: [facility] ENDOSCOPY;  Service: Endoscopy;  Laterality: N/A;  . CORONARY STENT PLACEMENT  Sept 2012   2nd OM with BMS  . EYE SURGERY     bilateral cataract  . HEMORROIDECTOMY    . LAMINECTOMY  05/30/2012  L 4 L5  . LAMINECTOMY WITH POSTERIOR LATERAL ARTHRODESIS LEVEL 3 N/A 10/18/2016   Procedure: Posterior Lateral Fusion - Lumbar One-Four, segmental instrumentation Lumbar One-Five,  decompression,;  Surgeon: Eustace Moore, MD;  Location: [facility] OR;  Service: Neurosurgery;  Laterality: N/A;  . LAPAROSCOPIC GASTRIC BANDING    . LEFT AND RIGHT HEART CATHETERIZATION WITH CORONARY ANGIOGRAM N/A 05/07/2014   Procedure: LEFT AND RIGHT HEART CATHETERIZATION WITH CORONARY ANGIOGRAM;  Surgeon: [name] Martinique, MD;  Location: [facility] CATH LAB;  Service: Cardiovascular;  Laterality: N/A;  . LEFT HEART CATH AND CORONARY ANGIOGRAPHY N/A 03/13/2017   Procedure: LEFT HEART CATH AND CORONARY ANGIOGRAPHY;  Surgeon: Martinique, [name], MD;  Location: [place] CV LAB;  Service: Cardiovascular;  Laterality: N/A;  . LUMBAR LAMINECTOMY/DECOMPRESSION MICRODISCECTOMY N/A 05/04/2016   Procedure: Laminectomy and Foraminotomy - Thoracic twelve-Lumbar one -Posterior Fusion Lumbar one-two;  Surgeon: Eustace Moore, MD;  Location: [place];  Service: Neurosurgery;  Laterality: N/A;  . POSTERIOR LUMBAR FUSION  10/18/2016  . ROTATOR CUFF REPAIR     both  . TONSILLECTOMY    . TRIGGER FINGER RELEASE     LEFT  . UVULOPALATOPHARYNGOPLASTY    . VASECTOMY          Home Medications    Prior to Admission medications   Medication Sig Start Date End Date Taking? Authorizing Provider    acetaminophen (TYLENOL) 500 MG tablet Take 1,000 mg by mouth every 8 (eight) hours as needed for mild pain or headache.     [provider]  albuterol (PROVENTIL HFA;VENTOLIN HFA) 108 (90 Base) MCG/ACT inhaler Inhale 2 puffs into the lungs every 4 (four) hours as needed for wheezing or shortness of breath.    [provider]  atorvastatin (LIPITOR) 10 MG tablet Take 5 mg by mouth daily at 6 PM.     [provider]  augmented betamethasone dipropionate (DIPROLENE-AF) 0.05 % cream Apply 1 application topically 2 (two) times daily as needed for itching. 01/31/17   [provider]  budesonide-formoterol (SYMBICORT) 80-4.5 MCG/ACT inhaler Inhale 2 puffs into the lungs 2 (two) times daily.     [provider]  Cholecalciferol (VITAMIN D-3) 5000 units TABS Take 5,000 Units by mouth daily.    [provider]  dicyclomine (BENTYL) 20 MG tablet Take 20 mg by mouth 2 (two) times daily as needed for spasms.     [provider]  furosemide (LASIX) 40 MG tablet Take 80 mg by mouth 2 (two) times daily.    [provider]  KLOR-CON M20 20 MEQ tablet TAKE 2 TABLETS BY MOUTH TWICE A DAY Patient taking differently: Take 40 MEQ by mouth twice daily 01/17/17   Martinique, [name], MD  losartan (COZAAR) 25 MG tablet TAKE 1 TABLET BY MOUTH EVERY DAY Patient taking differently: Take 25 mg by mouth once daily 11/06/16   Martinique, [name], MD  methocarbamol (ROBAXIN) 500 MG tablet Take 1 tablet (500 mg total) by mouth every 8 (eight) hours as needed for muscle spasms. 10/20/16   Eustace Moore, MD  metoprolol tartrate (LOPRESSOR) 25 MG tablet TAKE 1 TABLET BY MOUTH TWICE A DAY.TAKE EXTRA 1/2 TAB IF SBP>110 Patient taking differently: Take 25 mg by mouth 2 (two) times daily. TAKE EXTRA 12.5 MG IF SBP>110 03/01/17   Lendon Colonel, NP  montelukast (SINGULAIR) 10 MG tablet Take 10 mg by mouth at bedtime.    [provider]  nitroGLYCERIN (NITROSTAT) 0.4  MG SL tablet  Place 1 tablet (0.4 mg total) under the tongue every 5 (five) minutes as needed for chest pain. 09/15/16   Delos Haring, PA-C  pantoprazole (PROTONIX) 40 MG tablet Take 40 mg by mouth daily.    [provider]  Polyethyl Glycol-Propyl Glycol (SYSTANE OP) Place 1 drop into both eyes daily as needed (for dry eyes).     [provider]  psyllium (METAMUCIL) 58.6 % packet Take 1 packet by mouth daily.    [provider]  rivaroxaban (XARELTO) 20 MG TABS tablet Take 1 tablet (20 mg total) by mouth daily with supper. 03/14/17   Martinique, [name], MD  sildenafil (VIAGRA) 100 MG tablet Take 100 mg by mouth as needed for erectile dysfunction.  05/23/10   [provider]  Skin Protectants, Misc. (EUCERIN) cream Apply 1 application topically as needed for dry skin.    [provider]  spironolactone (ALDACTONE) 25 MG tablet Take 25 mg by mouth daily.    [provider]  tamsulosin (FLOMAX) 0.4 MG CAPS Take 0.4 mg by mouth every evening.     [provider]  topiramate (TOPAMAX) 25 MG capsule Take 50 mg by mouth 2 (two) times daily.     [provider]  traMADol (ULTRAM) 50 MG tablet Take 50 mg by mouth every 12 (twelve) hours as needed for moderate pain.  09/22/14   [provider]    Family History Family History  Problem Relation Age of Onset  . Heart disease Mother   . Diabetes Mother   . Other Mother        stent placement  . Emphysema Father 38  . Heart attack Sister     Social History Social History   Tobacco Use  . Smoking status: Former Smoker    Packs/day: 1.50    Years: 30.00    Pack years: 45.00    Last attempt to quit: 01/24/1992    Years since quitting: 25.2  . Smokeless tobacco: Never Used  . Tobacco comment: Stopped in 1983  Substance Use Topics  . Alcohol use: No  . Drug use: No     Allergies   Adhesive [tape]; Latex; and Morphine   Review of Systems Review of Systems    Constitutional: Negative for chills and fever.  Respiratory: Positive for shortness of breath (w/ exertion, unchanged). Negative for cough.   Cardiovascular: Positive for chest pain and palpitations. Negative for leg swelling.  Gastrointestinal: Negative for abdominal pain, blood in stool, diarrhea, nausea and vomiting.  Neurological: Negative for dizziness and syncope.  All other systems reviewed and are negative.  Physical Exam Updated Vital Signs BP 127/76   Pulse 95   Temp 97.8 F (36.6 C) (Oral)   Resp 18   Ht 5\' 7"  (1.702 m)   Wt 124.7 kg (275 lb)   SpO2 96%   BMI 43.07 kg/m   Physical Exam  Constitutional: He appears well-developed and well-nourished. No distress.  HENT:  Head: Normocephalic and atraumatic.  Eyes: Conjunctivae are normal. Right eye exhibits no discharge. Left eye exhibits no discharge.  Cardiovascular: An irregularly irregular rhythm present. Tachycardia present.  No murmur heard. Pulses:      Radial pulses are 2+ on the right side, and 2+ on the left side.  Pulmonary/Chest: Breath sounds normal. No respiratory distress. He has no wheezes. He has no rales.  Abdominal: Soft. He exhibits no distension. There is no tenderness.  Musculoskeletal:       Right lower leg:  He exhibits no tenderness.       Left lower leg: He exhibits no tenderness.  Neurological: He is alert.  Clear speech.   Skin: Skin is warm and dry. No rash noted.  Psychiatric: He has a normal mood and affect. His behavior is normal.  Nursing note and vitals reviewed.  ED Treatments / Results  Labs Results for orders placed or performed during the hospital encounter of 82/42/35  Basic metabolic panel  Result Value Ref Range   Sodium 138 135 - 145 mmol/L   Potassium 3.9 3.5 - 5.1 mmol/L   Chloride 107 101 - 111 mmol/L   CO2 21 (L) 22 - 32 mmol/L   Glucose, Bld 181 (H) 65 - 99 mg/dL   BUN 19 6 - 20 mg/dL   Creatinine, Ser 1.20 0.61 - 1.24 mg/dL   Calcium 8.8 (L) 8.9 - 10.3 mg/dL    GFR calc non Af Amer 57 (L) >60 mL/min   GFR calc Af Amer >60 >60 mL/min   Anion gap 10 5 - 15  CBC  Result Value Ref Range   WBC 6.4 4.0 - 10.5 K/uL   RBC 4.71 4.22 - 5.81 MIL/uL   Hemoglobin 12.0 (L) 13.0 - 17.0 g/dL   HCT 38.9 (L) 39.0 - 52.0 %   MCV 82.6 78.0 - 100.0 fL   MCH 25.5 (L) 26.0 - 34.0 pg   MCHC 30.8 30.0 - 36.0 g/dL   RDW 23.2 (H) 11.5 - 15.5 %   Platelets 178 150 - 400 K/uL  I-stat troponin, ED  Result Value Ref Range   Troponin i, poc 0.00 0.00 - 0.08 ng/mL   Comment 3           EKG EKG Interpretation  Date/Time:  Sunday April 15 2017 07:35:19 EDT Ventricular Rate:  131 PR Interval:    QRS Duration: 96 QT Interval:  324 QTC Calculation: 478 R Axis:   70 Text Interpretation:  Atrial flutter with 2:1 A-V conduction Incomplete right bundle branch block ST & T wave abnormality, consider inferolateral ischemia Abnormal ECG QRS morphology unchanged from previous. rate related T wave inversions. Flutter vs Sinus Tach Confirmed by Charlesetta Shanks [phone]) on 04/15/2017 8:38:16 AM  Radiology Dg Chest 2 View  Result Date: 04/15/2017 CLINICAL DATA:  Chest pain, shortness of breath and rapid heart palpitations last night and this morning, history coronary artery disease, diabetes mellitus, hypertension, prostate cancer, thoracic aortic aneurysm EXAM: CHEST - 2 VIEW COMPARISON:  10/16/2016 FINDINGS: Enlargement of cardiac silhouette. Mediastinal contours and pulmonary vascularity normal. Pulmonary vascular congestion. Bibasilar atelectasis. No acute infiltrate, pleural effusion or pneumothorax. Scattered endplate spur formation thoracic spine. IMPRESSION: Enlargement of cardiac silhouette with pulmonary vascular congestion. Bibasilar atelectasis. Electronically Signed   By: Lavonia Dana M.D.   On: 04/15/2017 09:39   Prior results:  03/06/17: Gated Spect Myo Perf w/ Lexiscan Study:  Overall Study Impression Myocardial perfusion is abnormal. There is a small defect of mild  severity present in the basal inferior, mid inferior and apex location. The defect is partially reversible. Overall image quality is poor due to soft tissue attenuation. Cannot rule out a small area of ischemia. This is an intermediate risk study. Overall left ventricular systolic function was normal. LV cavity size is normal. Nuclear stress EF: 61%. The left ventricular ejection fraction is normal (55-65%). There are no significant changes in comparison to the prior study.    Procedures Procedures (including critical care time) CRITICAL CARE Performed by: Kennith Maes  Total critical care time: 35 minutes  Critical care time was exclusive of separately billable procedures and treating other patients.  Critical care was necessary to treat or prevent imminent or life-threatening deterioration.  Critical care was time spent personally by me on the following activities: development of treatment plan with patient and/or surrogate as well as nursing, discussions with consultants, evaluation of patient's response to treatment, examination of patient, obtaining history from patient or surrogate, ordering and performing treatments and interventions, ordering and review of laboratory studies, ordering and review of radiographic studies, pulse oximetry and re-evaluation of patient's condition.  Medications Ordered in ED Medications  etomidate (AMIDATE) injection 10 mg (10 mg Intravenous See Procedure Record 04/15/17 1215)  midazolam (VERSED) injection 2 mg (2 mg Intravenous See Procedure Record 04/15/17 1206)  midazolam (VERSED) injection (1 mg Intravenous Given 04/15/17 1206)  etomidate (AMIDATE) injection (10 mg Intravenous Given 04/15/17 1207)  metoprolol tartrate (LOPRESSOR) injection 2.5 mg (2.5 mg Intravenous Given 04/15/17 0947)  furosemide (LASIX) injection 40 mg (40 mg Intravenous Given 04/15/17 1038)     Initial Impression / Assessment and Plan / ED Course  I have reviewed the triage  vital signs and the nursing notes.  Pertinent labs & imaging results that were available during my care of the patient were reviewed by me and considered in my medical decision making (see chart for details).  Patient presents with chest pressure and palpitations noted to be in atrial flutter vs. Atrial fibrillation with initial rate of 131. Patient is nontoxic appearing, noted to be tachycardic, vitals otherwise WNL. EKG with flutter vs. sinus tach, rate related T wave inversions, no obvious findings of ischemia. Will give patient 2.5 mg IV metoprolol. Labs notable for initial troponin WNL, no obvious ischemia findings on EKG, doubt ACS at this time. No significant electrolyte abnormalities. Hgb of 12.0 is improved from previous. No leukocytosis.  CXR findings appear consistent with heart failure.   Given patient with persistent arrhythmia which is atypical for him and findings of HF on CXR, will add on BNP, obtain repeat troponin, and administer Lasix. Will plan for cardiology consult and admission.   Findings and plan of care discussed with supervising physician Dr. Johnney Killian who personally evaluated and examined this patient and is in agreement.   10:40: CONSULT: Discussed case with cardiologist Dr. Sallyanne Kuster, will come to ED to evaluate the patient.   11:00: Dr. Sallyanne Kuster accepts admission. Requesting cardioversion in the ED.     12:20: Conscious sedation and successful cardioversion performed per Dr. Johnney Killian and Dr. Sallyanne Kuster procedure notes.    Final Clinical Impressions(s) / ED Diagnoses   Final diagnoses:  Atrial fibrillation, unspecified type ([place])  Heart failure, unspecified HF chronicity, unspecified heart failure type [facility])    ED Discharge Orders    None       Amaryllis Dyke, PA-C 04/15/17 1534    Charlesetta Shanks, MD 04/19/17 [phone]

## 2017-04-16 ENCOUNTER — Telehealth: Payer: Self-pay | Admitting: Pulmonary Disease

## 2017-04-16 DIAGNOSIS — Z5181 Encounter for therapeutic drug level monitoring: Secondary | ICD-10-CM

## 2017-04-16 DIAGNOSIS — I4891 Unspecified atrial fibrillation: Secondary | ICD-10-CM

## 2017-04-16 DIAGNOSIS — Z79899 Other long term (current) drug therapy: Secondary | ICD-10-CM

## 2017-04-16 LAB — BASIC METABOLIC PANEL
Anion gap: 9 (ref 5–15)
BUN: 19 mg/dL (ref 6–20)
CO2: 22 mmol/L (ref 22–32)
Calcium: 8.9 mg/dL (ref 8.9–10.3)
Chloride: 108 mmol/L (ref 101–111)
Creatinine, Ser: 1.13 mg/dL (ref 0.61–1.24)
GFR calc Af Amer: >60 mL/min (ref >60)
GFR calc non Af Amer: >60 mL/min (ref >60)
Glucose, Bld: 140 mg/dL — ABNORMAL HIGH (ref 65–99)
Potassium: 3.6 mmol/L (ref 3.5–5.1)
Sodium: 139 mmol/L (ref 135–145)

## 2017-04-16 LAB — MAGNESIUM: Magnesium: 2.1 mg/dL (ref 1.7–2.4)

## 2017-04-16 MED ORDER — DOFETILIDE 250 MCG PO CAPS
250.0000 ug | ORAL_CAPSULE | Freq: Two times a day (BID) | ORAL | Status: DC
  Administered 2017-04-16: 250 ug via ORAL
  Filled 2017-04-16 (×2): qty 1

## 2017-04-16 NOTE — Care Management (Addendum)
Tikosyn Benefit Check Copay amount reviewed with pt, [facility] [address] [facility] has both 250 mcg and 500 mcg in inventory of generic, if pt needs name brand pharmacy can not give ETA until prescription is received  TIKOSYN 125 MCG  250 MCG  500 MCG BID  [place] # [phone]   2.DOFETILIDE 1250 MCG BID  COVER- YES  CO-PAY- $ 139.83  PRIOR APPROVAL- NO   3 DOFETILIDE 250 MCG BID  COVER- YES  CO-PAY- $ 139.83  PRIOR APPROVAL- NO   4 . DOFETILIDE 500 MCG BID  COVER- YES  C-PAY- $ 139.83  PRIOR APPROVAL- NO    PREFERRED PHARMACY : YES - [facility] AND [facility]

## 2017-04-16 NOTE — Telephone Encounter (Signed)
Spoke with pt's wife, Hassan Rowan. States that pt is to have an ONO done tonight. He is currently in the hospital and will not be able to do this test. I have contacted [facility] and Corene Cornea is going to put this order on hold. Nothing further is needed at this time.

## 2017-04-16 NOTE — Progress Notes (Signed)
12 lead EKG performed at 2029.  QTC 460, Tikosyn dose administered at 2034.  Repeat 12 lead EKG performed at 2218, QTC 506.  Will continue to monitor.

## 2017-04-16 NOTE — Progress Notes (Addendum)
The patient has been seen in conjunction with Kathyrn Drown, NP-C. All aspects of care have been considered and discussed. The patient has been personally interviewed, examined, and all clinical data has been reviewed.   Paroxysmal atrial fibrillation converted to normal sinus rhythm within the past 36 hours.  Dofetilide was started yesterday at 500 mcg twice daily.  QT this morning greater than 500 ms after a.m. dose of dofetilide.  Measure QTc interval prior to next dose from a 12-lead EKG.  If QTC less than 470, will resume dofetilide at 250 mcg daily.  If greater than 470 will hold dose until QTC is within an acceptable range.   Progress Note  Patient Name: Carlos Dawson Date of Encounter: 04/16/2017  Primary Cardiologist: Dr. Peter Martinique  Subjective   Pt feeling better today. Denies chest pain, shortness of breath better. Tikosyn initiated over the weekend s/p DCCV to NSR. First dose 04/15/17. EKG performed this AM with prolonged QTc. EP curb-sided for assistance. Will decrease dose to 250 mg for this evening dose. RN instructed for EKG 1-2 hours post administration for follow up.   Inpatient Medications    Scheduled Meds: . atorvastatin  5 mg Oral q1800  . dofetilide  500 mcg Oral BID  . furosemide  80 mg Oral BID  . losartan  25 mg Oral Daily  . metoprolol tartrate  25 mg Oral BID  . midazolam  2 mg Intravenous Once  . mometasone-formoterol  2 puff Inhalation BID  . montelukast  10 mg Oral QHS  . potassium chloride SA  40 mEq Oral BID  . rivaroxaban  20 mg Oral Q supper  . sodium chloride flush  3 mL Intravenous Q12H  . spironolactone  25 mg Oral Daily  . tamsulosin  0.4 mg Oral QPM  . topiramate  50 mg Oral BID   Continuous Infusions: . sodium chloride     PRN Meds: sodium chloride, acetaminophen, albuterol, hydrocerin, methocarbamol, nitroGLYCERIN, ondansetron (ZOFRAN) IV, sodium chloride flush, traMADol   Vital Signs    Vitals:   04/15/17 2321  04/16/17 0355 04/16/17 0718 04/16/17 0725  BP: (!) 106/46 (!) 106/59 110/65   Pulse: 63 (!) 55 (!) 59 (!) 55  Resp: 17 19 17 20   Temp: 98.3 F (36.8 C) (!) 97.5 F (36.4 C) (!) 97.5 F (36.4 C)   TempSrc: Oral Oral Oral   SpO2: 96% 96% 96% 98%  Weight:  263 lb 3.7 oz (119.4 kg)    Height:        Intake/Output Summary (Last 24 hours) at 04/16/2017 1001 Last data filed at 04/16/2017 0918 Gross per 24 hour  Intake 360 ml  Output 450 ml  Net -90 ml   Filed Weights   04/15/17 0741 04/16/17 0355  Weight: 275 lb (124.7 kg) 263 lb 3.7 oz (119.4 kg)    Physical Exam   General: Well developed, well nourished, NAD Skin: Warm, dry, intact  Head: Normocephalic, atraumatic, clear, moist mucus membranes. Neck: Negative for carotid bruits. No JVD Lungs:Clear to ausculation bilaterally. No wheezes, rales, or rhonchi. Breathing is unlabored. Cardiovascular: RRR with S1 S2. No murmurs, rubs, or gallops Abdomen: Soft, non-tender, non-distended with normoactive bowel sounds. No obvious abdominal masses. MSK: Strength and tone appear normal for age. 5/5 in all extremities Extremities: No edema. No clubbing or cyanosis. DP/PT pulses 2+ bilaterally Neuro: Alert and oriented. No focal deficits. No facial asymmetry. MAE spontaneously. Psych: Responds to questions appropriately with normal affect.  Labs    Chemistry Recent Labs  Lab 04/15/17 0743 04/15/17 1434 04/16/17 0234  NA 138 140 139  K 3.9 3.6 3.6  CL 107 107 108  CO2 21* 24 22  GLUCOSE 181* 136* 140*  BUN 19 18 19   CREATININE 1.20 1.15 1.13  CALCIUM 8.8* 8.6* 8.9  GFRNONAA 57* >60 >60  GFRAA >60 >60 >60  ANIONGAP 10 9 9      Hematology Recent Labs  Lab 04/15/17 0743  WBC 6.4  RBC 4.71  HGB 12.0*  HCT 38.9*  MCV 82.6  MCH 25.5*  MCHC 30.8  RDW 23.2*  PLT 178    Cardiac EnzymesNo results for input(s): TROPONINI in the last 168 hours.  Recent Labs  Lab 04/15/17 0759 04/15/17 1051  TROPIPOC 0.00 0.00      BNP Recent Labs  Lab 04/15/17 1038  BNP 191.1*     DDimer No results for input(s): DDIMER in the last 168 hours.   Radiology    Dg Chest 2 View  Result Date: 04/15/2017 CLINICAL DATA:  Chest pain, shortness of breath and rapid heart palpitations last night and this morning, history coronary artery disease, diabetes mellitus, hypertension, prostate cancer, thoracic aortic aneurysm EXAM: CHEST - 2 VIEW COMPARISON:  10/16/2016 FINDINGS: Enlargement of cardiac silhouette. Mediastinal contours and pulmonary vascularity normal. Pulmonary vascular congestion. Bibasilar atelectasis. No acute infiltrate, pleural effusion or pneumothorax. Scattered endplate spur formation thoracic spine. IMPRESSION: Enlargement of cardiac silhouette with pulmonary vascular congestion. Bibasilar atelectasis. Electronically Signed   By: Lavonia Dana M.D.   On: 04/15/2017 09:39   Telemetry    04/16/17 NSR with frequent PAC's. - Personally Reviewed  ECG    04/15/17 NSR with PACs HR 62;  QTC 520 ms- Personally Reviewed  Cardiac Studies   Cardiac cath 03/13/2017:  Previously placed Ost 2nd Mrg to 2nd Mrg stent (unknown type) is widely patent.  There is no aortic valve regurgitation.   1. No significant obstructive CAD 2. Aneurysmal thoracic aorta.   Echocardiogram 03/07/2017: Study Conclusions  - Left ventricle: The cavity size was normal. Wall thickness was   increased in a pattern of mild LVH. Systolic function was normal.   The estimated ejection fraction was in the range of 55% to 60%.   Wall motion was normal; there were no regional wall motion   abnormalities. Left ventricular diastolic function parameters   were normal. - Aortic valve: There was very mild stenosis. Valve area (VTI):   1.81 cm^2. Valve area (Vmax): 1.94 cm^2. Valve area (Vmean): 1.85   cm^2. - Right atrium: The atrium was mildly dilated. - Atrial septum: No defect or patent foramen ovale was identified. - Pulmonary arteries:  PA peak pressure: 54 mm Hg (S).  LexiScan Myoview perfusion study 03/06/2017:   The left ventricular ejection fraction is normal (55-65%).  Nuclear stress EF: 61%.  There is a small defect of mild severity present in the basal inferior, mid inferior and apex location. The defect is partially reversible. Overall image quality is poor due to soft tissue attenuation. Cannot rule out a small area of ischemia.  There is evidence of transient ischemic dilatation with a TID of 1.27.  This is an intermediate risk study.  There was no ST segment deviation noted during stress.   Patient Profile     78 y.o. male with a history of paroxysmal atrial fibrillation requiring cardioversion in August 2018, moderate to large ascending aortic aneurysm, coronary artery disease (2012 bare-metal stent to OM 2), diabetes  mellitus, chronic diastolic heart failure, hypertension, obstructive sleep apnea and remote history of acute pulmonary embolism, presenting with symptomatic atrial fibrillation rapid ventricular response.  Assessment & Plan    1.  Symptomatic paroxysmal atrial fibrillation with RVR: -On chronic anticoagulation>> Xarelto, compliant -Cardioversion 04/15/2017 x 2, synchronized biphasic 120 J unsuccessful, 200 J shock with successful conversion to NSR with frequent PACs.  -Tikosyn 500mg  PO BID, initiated this admission on 04/15/17. QTC 428 per EKG post cardioversion. Mg+ 2.1 and K+ 3.6 this AM. K+ replaced with 34meq K BID. Will continue to monitor with BMET -Repeat EKG this AM with calculated QTc at 520 ms per MD -Will most likely need to decrease dose to no more than 250 mg>> EP curb-sided for assistance  -Will need to get 12-lead EKG prior to each dose from here on out  -Metoprolol 25 mg PO BID -Xarelto 20 mg PO QD -CHA2DS2VASc =6  2.  Chronic diastolic congestive heart failure: -Exacerbated by atrial fibrillation with RVR -Echocardiogram 02/2017  -Losartan, Lasix, spironolactone -Weight,  263lb>>>275lb on admission  -I&O, net negative 450 mL, total output 450  3.  Obstructive CAD s/p BMS 2012: -Denies chest pain -Troponin negative -Most recent coronary evaluation per cardiac cath 02/2017 with no significant coronary stenosis, widely patent old stent to the OM 2 and confirmed presence of his known aneurysmal thoracic aorta. -Status post BMS to second OM in 2012, low risk NST in 12/2012 -Pending echocardiogram -Statin, BB  4.  OSA: -Home CPAP   5.  DM 2: -Hemoglobin A1c, 7.4 -Will need to follow with PCP once discharged    6.  HTN: -Stable, 110/65, 106/59, 106/46, 105/41 -Continue current regimen   Signed, Kathyrn Drown NP-C [facility] Pager: [phone] 04/16/2017, 10:01 AM     For questions or updates, please contact   Please consult [email or web address] for contact info under Cardiology/STEMI.

## 2017-04-17 LAB — BASIC METABOLIC PANEL
Anion gap: 10 (ref 5–15)
BUN: 21 mg/dL — ABNORMAL HIGH (ref 6–20)
CO2: 24 mmol/L (ref 22–32)
Calcium: 8.8 mg/dL — ABNORMAL LOW (ref 8.9–10.3)
Chloride: 104 mmol/L (ref 101–111)
Creatinine, Ser: 1.19 mg/dL (ref 0.61–1.24)
GFR calc Af Amer: >60 mL/min (ref >60)
GFR calc non Af Amer: 58 mL/min — ABNORMAL LOW (ref >60)
Glucose, Bld: 148 mg/dL — ABNORMAL HIGH (ref 65–99)
Potassium: 3.7 mmol/L (ref 3.5–5.1)
Sodium: 138 mmol/L (ref 135–145)

## 2017-04-17 LAB — MAGNESIUM: Magnesium: 2 mg/dL (ref 1.7–2.4)

## 2017-04-17 MED ORDER — POLYETHYLENE GLYCOL 3350 17 G PO PACK
17.0000 g | PACK | Freq: Every day | ORAL | Status: DC | PRN
Start: 2017-04-17 — End: 2017-04-18

## 2017-04-17 MED ORDER — OFF THE BEAT BOOK
Freq: Once | Status: DC
  Filled 2017-04-17: qty 1

## 2017-04-17 NOTE — Progress Notes (Signed)
Progress Note  Patient Name: Carlos Dawson Date of Encounter: 04/17/2017  Primary Cardiologist: Peter Martinique  Subjective   Patient is frustrated.  He feels palpitations and gets frightened that he is back in atrial fibrillation.  He wants to avoid ever having another cardioversion.  Inpatient Medications    Scheduled Meds: . atorvastatin  5 mg Oral q1800  . dofetilide  250 mcg Oral BID  . furosemide  80 mg Oral BID  . losartan  25 mg Oral Daily  . metoprolol tartrate  25 mg Oral BID  . midazolam  2 mg Intravenous Once  . mometasone-formoterol  2 puff Inhalation BID  . montelukast  10 mg Oral QHS  . potassium chloride SA  40 mEq Oral BID  . rivaroxaban  20 mg Oral Q supper  . sodium chloride flush  3 mL Intravenous Q12H  . spironolactone  25 mg Oral Daily  . tamsulosin  0.4 mg Oral QPM  . topiramate  50 mg Oral BID   Continuous Infusions: . sodium chloride     PRN Meds: sodium chloride, acetaminophen, albuterol, hydrocerin, methocarbamol, nitroGLYCERIN, ondansetron (ZOFRAN) IV, sodium chloride flush, traMADol   Vital Signs    Vitals:   04/16/17 2311 04/17/17 0430 04/17/17 0433 04/17/17 0718  BP: 94/68  (!) 100/54 93/62  Pulse:    61  Resp:  18  18  Temp: 97.8 F (36.6 C)  97.9 F (36.6 C) (!) 97.5 F (36.4 C)  TempSrc: Axillary  Oral Oral  SpO2: 94% 92% 97% 98%  Weight:   261 lb 7.5 oz (118.6 kg)   Height:        Intake/Output Summary (Last 24 hours) at 04/17/2017 1015 Last data filed at 04/17/2017 0819 Gross per 24 hour  Intake 845 ml  Output 2200 ml  Net -1355 ml   Filed Weights   04/15/17 0741 04/16/17 0355 04/17/17 0433  Weight: 275 lb (124.7 kg) 263 lb 3.7 oz (119.4 kg) 261 lb 7.5 oz (118.6 kg)    Telemetry    Normal sinus rhythm with frequent PACs and bursts of SVT.- Personally Reviewed  ECG    QTC this a.m. from EKG is 551 ms.- Personally Reviewed  Physical Exam  Appears anxious. GEN: No acute distress.   Neck: No JVD Cardiac: RRR,  1/6 right upper sternal border systolic murmur, no rubs, or gallops.  Respiratory: Clear to auscultation bilaterally. GI: Soft, nontender, non-distended  MS: No edema; No deformity. Neuro:  Nonfocal  Psych: Normal affect   Labs    Chemistry Recent Labs  Lab 04/15/17 1434 04/16/17 0234 04/17/17 0253  NA 140 139 138  K 3.6 3.6 3.7  CL 107 108 104  CO2 24 22 24   GLUCOSE 136* 140* 148*  BUN 18 19 21*  CREATININE 1.15 1.13 1.19  CALCIUM 8.6* 8.9 8.8*  GFRNONAA >60 >60 58*  GFRAA >60 >60 >60  ANIONGAP 9 9 10      Hematology Recent Labs  Lab 04/15/17 0743  WBC 6.4  RBC 4.71  HGB 12.0*  HCT 38.9*  MCV 82.6  MCH 25.5*  MCHC 30.8  RDW 23.2*  PLT 178    Cardiac EnzymesNo results for input(s): TROPONINI in the last 168 hours.  Recent Labs  Lab 04/15/17 0759 04/15/17 1051  TROPIPOC 0.00 0.00     BNP Recent Labs  Lab 04/15/17 1038  BNP 191.1*     DDimer No results for input(s): DDIMER in the last 168 hours.   Radiology  No results found.  Cardiac Studies   February 2019 cardiac catheterization revealed nonobstructive coronary disease.  Echo from 02/2017 revealed EF of 55%.  Patient Profile     77 y.o. male with a history of paroxysmal atrial fibrillation requiring cardioversion in August 2018, moderate to large ascending aortic aneurysm, coronary artery disease (2012 bare-metal stent to OM 2), diabetes mellitus, chronic diastolic heart failure, hypertension, obstructive sleep apnea and remote history of acute pulmonary embolism, presenting with symptomatic atrial fibrillation rapid ventricular response.  Dofetilide has been started but complicated by prolonged QTC of 500 mcg and 250 mcg doses.   Assessment & Plan    1.  Symptomatic recurrent paroxysmal atrial fibrillation.  Rhythm control has been recommended because of severe symptoms when in atrial fibrillation.  Had cardioversion this admission and attempted to initiate dofetilide to stabilize rhythm  however QTC has been prolonged on doses of 250 and 500 mcg.  QTC this morning pre-therapy after 250 mcg of dofetilide last evening was 551 ms. 2.  Coronary artery disease with angina when in atrial fib with rapid ventricular response.  Recent catheterization did not reveal significant obstructive disease. 3.  Sleep apnea  Plan EP consultation with recommendations to help with rhythm control.  Perhaps amiodarone or sotalol could be considered.  Will repeat EKG at 2 PM and if QTC less than 470 ms will give a dose of 125 mcg of dofetilide.  This plan could change depending upon recommendations from EP.  For questions or updates, please contact [place] Please consult [email or web address] for contact info under Cardiology/STEMI.      Signed, Sinclair Grooms, MD  04/17/2017, 10:15 AM

## 2017-04-17 NOTE — Consult Note (Addendum)
Cardiology Consultation:   Patient ID: Carlos Dawson; [id number]; July 26, 1940   Admit date: 04/15/2017 Date of Consult: 04/17/2017  Primary Care Provider: Josetta Huddle, MD Primary Cardiologist: Dr. Martinique   Patient Profile:   Carlos Dawson is a 77 y.o. male with a hx of CAD (PCI w/BMS in 2012 to OM), DM, HTN, chronic CHF (diastolic), OSA, remote pulm embolism, ascending Aortic aneurysm (last 5.0cm) follows with Dr. Servando Snare), and paroxysmal Afib who is being seen today for the evaluation of AAD/Tikosyn  at the request of Dr. Tamala Julian.  History of Present Illness:   [name] was woken from sleep with rapid palpitations and SOB, noted in ER w/AFib RVR, felt less symptomatic with rate control though remained aware of his palpitations.  He had no CP.  Cardiology was consulted, he reported compliance with his a/c and, noted sinus rates tend to be 50's and AF rate this admission was 140's on arrival and underwent DCCV with Dr. Sallyanne Kuster and planned for AAD with either amiodarone w/o BB given hx of brady or dofetilide post DCCV if QT allowed.  He was started on Tiksoyn 575mcg, had some QT prolongation and dose reduced to 281mcg. His AM dose not given today with QTC >500 and EP is asked to aid in management.  In review of record patient has been particularly frustrated of late with worsening or at least persistent DOE (I see this as a complaint though it seems for a coupl years), undergoing cardia w/u noting abn stress that led to [facility] with no significant disease and patent stent, LVEF was 55-60% with very mild AS, no DD was described though Dr. Sallyanne Kuster in his review felt there was clear evidence of this.  Patient reports for 2 years he has been unable to do even the simplest of things that previously were easy, of late even bring the trash cans down and walking back makes his feel very SOB.  No CP.  He reports a couple weeks ago a near syncopal spell upon standing after eating out.  Otherwise no  dizziness or syncope.    s/p IV >> PO lasix Fluid Neg cumulatively -2174ml LABS K+ 3.7 (has been getting replacement) Mag 2.0 BUN/Creat 21/1.19 (Calc CrCl is 90) WBC 6.4 H/H 12/38 Plts 178 BNP 191    AFib Hx: Onset uncertain, mentioned in record going back to 2012 [facility] 08/2016 A/c with xarelto  Past Medical History:  Diagnosis Date  . Anticoagulant long-term use   . Aortic root enlargement ([place])   . Ascending aortic aneurysm [facility])    recent scan in October 2012 showing no change; followed by Dr. Servando Snare  . ASCVD (arteriosclerotic cardiovascular disease)    Prior BMS to the 2nd OM in September 2012; with repeat cath in October showing patency  . CAD (coronary artery disease)    a. s/p BMS to 2nd OM in Sept 2012; b. LexiScan Myoview (12/2012):  Inf infarct; bowel and motion artifact make study difficult to interpret; no ischemia; not gated; Low Risk  . CHF (congestive heart failure) ([place])    no recent issues 10/13/14  . Colonic polyp   . Contact lens/glasses fitting   . Diabetes mellitus without complication (HCC)    metphormin, average 154 dx 2017  . Diastolic dysfunction   . Generalized headaches    neck stenosis  . GERD (gastroesophageal reflux disease)   . Hearing loss   . Hearing loss    more so on left  . Hemorrhoids   . Hypertension   .  IBS (irritable bowel syndrome)   . LVH (left ventricular hypertrophy)   . Mild intermittent asthma   . OA (osteoarthritis)   . Obesities, morbid ([place])   . OSA (obstructive sleep apnea)    PSG 03/30/97 AHI 21, BPAP 13/9  . PAF (paroxysmal atrial fibrillation) ([place])    a. on Xarelto b. s/p DCCV in 08/2016  . Pneumonia   . Prostate CA [facility])    Oncologist  DR. Daral[facility] 09/24/[facility], undetermined tx   prostate  . Pulmonary embolism ([place])    2008  . Sleep apnea   . SOB (shortness of breath)    on excertion  . Thoracic aortic aneurysm (HCC)    Aortic Size Index=     5.0    /Body surface area is 2.43 meters squared. = 2.05   < 2.75 cm/m2      4% risk per year 2.75 to 4.25          8% risk per year > 4.25 cm/m2    20% risk per year   Stable aneurysmal dilation of the ascending aorta with maximum AP diameter of 4.8 cm. Stable area of narrowing of the proximal most portion of the descending aorta measuring 2 cm., previously identified as an area of coarctation. No evidence of aortic dissection.  Coronary artery disease.  Normal appearance of the lungs.   Electronically Signed   By: Fidela Salisbury M.D.   On: 10/01/2014 08:50      Past Surgical History:  Procedure Laterality Date  . ACHILLES TENDON REPAIR    . AORTIC ARCH ANGIOGRAPHY N/A 03/13/2017   Procedure: AORTIC ARCH ANGIOGRAPHY;  Surgeon: Martinique, [name], MD;  Location: [place] CV LAB;  Service: Cardiovascular;  Laterality: N/A;  . APPENDECTOMY    . BACK SURGERY    . CARDIAC CATHETERIZATION  2006  . CARDIAC CATHETERIZATION  October 2012   Stent patent  . CARPAL TUNNEL RELEASE     LEFT  . CERVICAL SPINE SURGERY  06/02/2010   lower back and neck  . CHOLECYSTECTOMY    . COLONOSCOPY WITH PROPOFOL N/A 12/29/2014   Procedure: COLONOSCOPY WITH PROPOFOL;  Surgeon: Garlan Fair, MD;  Location: [facility] ENDOSCOPY;  Service: Endoscopy;  Laterality: N/A;  . CORONARY STENT PLACEMENT  Sept 2012   2nd OM with BMS  . EYE SURGERY     bilateral cataract  . HEMORROIDECTOMY    . LAMINECTOMY  05/30/2012   L 4 L5  . LAMINECTOMY WITH POSTERIOR LATERAL ARTHRODESIS LEVEL 3 N/A 10/18/2016   Procedure: Posterior Lateral Fusion - Lumbar One-Four, segmental instrumentation Lumbar One-Five,  decompression,;  Surgeon: Eustace Moore, MD;  Location: [facility] OR;  Service: Neurosurgery;  Laterality: N/A;  . LAPAROSCOPIC GASTRIC BANDING    . LEFT AND RIGHT HEART CATHETERIZATION WITH CORONARY ANGIOGRAM N/A 05/07/2014   Procedure: LEFT AND RIGHT HEART CATHETERIZATION WITH CORONARY ANGIOGRAM;  Surgeon: [name] Martinique, MD;  Location: [facility] CATH LAB;  Service: Cardiovascular;  Laterality: N/A;  .  LEFT HEART CATH AND CORONARY ANGIOGRAPHY N/A 03/13/2017   Procedure: LEFT HEART CATH AND CORONARY ANGIOGRAPHY;  Surgeon: Martinique, [name], MD;  Location: [place] CV LAB;  Service: Cardiovascular;  Laterality: N/A;  . LUMBAR LAMINECTOMY/DECOMPRESSION MICRODISCECTOMY N/A 05/04/2016   Procedure: Laminectomy and Foraminotomy - Thoracic twelve-Lumbar one -Posterior Fusion Lumbar one-two;  Surgeon: Eustace Moore, MD;  Location: [place];  Service: Neurosurgery;  Laterality: N/A;  . POSTERIOR LUMBAR FUSION  10/18/2016  . ROTATOR CUFF REPAIR  both  . TONSILLECTOMY    . TRIGGER FINGER RELEASE     LEFT  . UVULOPALATOPHARYNGOPLASTY    . VASECTOMY         Inpatient Medications: Scheduled Meds: . atorvastatin  5 mg Oral q1800  . dofetilide  250 mcg Oral BID  . furosemide  80 mg Oral BID  . losartan  25 mg Oral Daily  . metoprolol tartrate  25 mg Oral BID  . midazolam  2 mg Intravenous Once  . mometasone-formoterol  2 puff Inhalation BID  . montelukast  10 mg Oral QHS  . potassium chloride SA  40 mEq Oral BID  . rivaroxaban  20 mg Oral Q supper  . sodium chloride flush  3 mL Intravenous Q12H  . spironolactone  25 mg Oral Daily  . tamsulosin  0.4 mg Oral QPM  . topiramate  50 mg Oral BID   Continuous Infusions: . sodium chloride     PRN Meds: sodium chloride, acetaminophen, albuterol, hydrocerin, methocarbamol, nitroGLYCERIN, ondansetron (ZOFRAN) IV, sodium chloride flush, traMADol  Allergies:    Allergies  Allergen Reactions  . Adhesive [Tape] Itching and Rash  . Latex Itching, Rash and Other (See Comments)    Bandaids  . Morphine Itching    Social History:   Social History   Socioeconomic History  . Marital status: Married    Spouse name: Not on file  . Number of children: 3  . Years of education: Not on file  . Highest education level: Not on file  Occupational History  . Occupation: Retired from Scientist, clinical (histocompatibility and immunogenetics): RETIRED  Social Needs  . Financial resource strain:  Not on file  . Food insecurity:    Worry: Not on file    Inability: Not on file  . Transportation needs:    Medical: Not on file    Non-medical: Not on file  Tobacco Use  . Smoking status: Former Smoker    Packs/day: 1.50    Years: 30.00    Pack years: 45.00    Last attempt to quit: 01/24/1992    Years since quitting: 25.2  . Smokeless tobacco: Never Used  . Tobacco comment: Stopped in 1983  Substance and Sexual Activity  . Alcohol use: No  . Drug use: No  . Sexual activity: Yes  Lifestyle  . Physical activity:    Days per week: Not on file    Minutes per session: Not on file  . Stress: Not on file  Relationships  . Social connections:    Talks on phone: Not on file    Gets together: Not on file    Attends religious service: Not on file    Active member of club or organization: Not on file    Attends meetings of clubs or organizations: Not on file    Relationship status: Not on file  . Intimate partner violence:    Fear of current or ex partner: Not on file    Emotionally abused: Not on file    Physically abused: Not on file    Forced sexual activity: Not on file  Other Topics Concern  . Not on file  Social History Narrative  . Not on file    Family History:   Family History  Problem Relation Age of Onset  . Heart disease Mother   . Diabetes Mother   . Other Mother        stent placement  . Emphysema Father 11  . Heart attack Sister  ROS:  Please see the history of present illness.  All other ROS reviewed and negative.     Physical Exam/Data:   Vitals:   04/16/17 2311 04/17/17 0430 04/17/17 0433 04/17/17 0718  BP: 94/68  (!) 100/54 93/62  Pulse:    61  Resp:  18  18  Temp: 97.8 F (36.6 C)  97.9 F (36.6 C) (!) 97.5 F (36.4 C)  TempSrc: Axillary  Oral Oral  SpO2: 94% 92% 97% 98%  Weight:   261 lb 7.5 oz (118.6 kg)   Height:        Intake/Output Summary (Last 24 hours) at 04/17/2017 1037 Last data filed at 04/17/2017 0819 Gross per 24 hour    Intake 845 ml  Output 2200 ml  Net -1355 ml   Filed Weights   04/15/17 0741 04/16/17 0355 04/17/17 0433  Weight: 275 lb (124.7 kg) 263 lb 3.7 oz (119.4 kg) 261 lb 7.5 oz (118.6 kg)   Body mass index is 40.95 kg/m.  General:  Well nourished, well developed, in no acute distress HEENT: normal Lymph: no adenopathy Neck: no JVD Endocrine:  No thryomegaly Vascular: No carotid bruits Cardiac:  RRR; no murmur, no significant murmurs, no gallops or rubs Lungs:  CTA b/l, no wheezing, rhonchi or rales  Abd: soft, nontender, no hepatomegaly  Ext: no edema Musculoskeletal:  No deformities Skin: warm and dry  Neuro:  No gross focal abnormalities noted Psych:  Normal affect   EKG:  The EKG was personally reviewed and demonstrates:   AFlutter 131bpm, ICRBBB, QRS 70ms >> 04/15/17 post DCCV SR 62bpm, 1st degree AVblock, PR 243ms, QRS 180ms, Qtc 467ms Unable to locate 04/15/17 post 1st dose EKG 04/16/17 post #2 558mcg, SR, PACs, 76bpm, QTc 543ms F/u EKG pre #3 dose QTc was 433ms dose adjusted to 226mcg Post #4 dose (219mcg) EKG SR PACs, 75bpm, measured 506-57ms This AM pre Tiksoyn EKG was reported 585ms and dose held, I measure 470ms corrects to 537ms  Telemetry:  Telemetry was personally reviewed and demonstrates:   SR 60's generally, increasing frequency of PAC's, often grouped  Relevant CV Studies:  03/07/17: TTE Study Conclusions - Left ventricle: The cavity size was normal. Wall thickness was   increased in a pattern of mild LVH. Systolic function was normal.   The estimated ejection fraction was in the range of 55% to 60%.   Wall motion was normal; there were no regional wall motion   abnormalities. Left ventricular diastolic function parameters   were normal. - Aortic valve: There was very mild stenosis. Valve area (VTI):   1.81 cm^2. Valve area (Vmax): 1.94 cm^2. Valve area (Vmean): 1.85   cm^2. - Right atrium: The atrium was mildly dilated. - Atrial septum: No defect or  patent foramen ovale was identified. - Pulmonary arteries: PA peak pressure: 54 mm Hg (S).    03/13/17: LHC (done 2/2 abn stress and c/o DOE)  Previously placed Ost 2nd Mrg to 2nd Mrg stent (unknown type) is widely patent.  There is no aortic valve regurgitation.   1. No significant obstructive CAD 2. Aneurysmal thoracic aorta.   Plan: continue medical management . Consider pulmonary evaluation for symptoms of dyspnea.   Laboratory Data:  Chemistry Recent Labs  Lab 04/15/17 1434 04/16/17 0234 04/17/17 0253  NA 140 139 138  K 3.6 3.6 3.7  CL 107 108 104  CO2 24 22 24   GLUCOSE 136* 140* 148*  BUN 18 19 21*  CREATININE 1.15 1.13 1.19  CALCIUM 8.6*  8.9 8.8*  GFRNONAA >60 >60 58*  GFRAA >60 >60 >60  ANIONGAP 9 9 10     No results for input(s): PROT, ALBUMIN, AST, ALT, ALKPHOS, BILITOT in the last 168 hours. Hematology Recent Labs  Lab 04/15/17 0743  WBC 6.4  RBC 4.71  HGB 12.0*  HCT 38.9*  MCV 82.6  MCH 25.5*  MCHC 30.8  RDW 23.2*  PLT 178   Cardiac EnzymesNo results for input(s): TROPONINI in the last 168 hours.  Recent Labs  Lab 04/15/17 0759 04/15/17 1051  TROPIPOC 0.00 0.00    BNP Recent Labs  Lab 04/15/17 1038  BNP 191.1*    DDimer No results for input(s): DDIMER in the last 168 hours.  Radiology/Studies:   Dg Chest 2 View Result Date: 04/15/2017 CLINICAL DATA:  Chest pain, shortness of breath and rapid heart palpitations last night and this morning, history coronary artery disease, diabetes mellitus, hypertension, prostate cancer, thoracic aortic aneurysm EXAM: CHEST - 2 VIEW COMPARISON:  10/16/2016 FINDINGS: Enlargement of cardiac silhouette. Mediastinal contours and pulmonary vascularity normal. Pulmonary vascular congestion. Bibasilar atelectasis. No acute infiltrate, pleural effusion or pneumothorax. Scattered endplate spur formation thoracic spine. IMPRESSION: Enlargement of cardiac silhouette with pulmonary vascular congestion. Bibasilar  atelectasis. Electronically Signed   By: Lavonia Dana M.D.   On: 04/15/2017 09:39    Assessment and Plan:   1. Paroxysmal AFib     CHA2DS2 Vasc is 4, on xarelto, appropriately dosed  Persistent QT lengthening despite lowered dose of Tikosyn, no dose this AM EKG here looks to be a flutter, Aug 2018 is more difficult, though also looks like flutter  Will rever with Dr. Rayann Heman, he will see later today Consider amiodarone vs perhaps ablation Patient reports good compliance with CPAP Exertional intolerance limits his ability to exercise His new Apple watch suggests more Af frequency  The patient very much feels his AF is the limiting factor though difficult to make a clear connection Saw pulmonology earlier this month suspected multifactorial with deconditioning, asthma, and diastolic CHF, planned for "ONO with CPAP to determine if he might need to resume oxygen at night - he would likely need in lab titration study prior to restarting nocturnal oxygen"   2. CHF      Felt to have diastolic dysfunction by Dr. Sallyanne Kuster  3. CAD     Stable by last cath in feb   Dr. Rayann Heman has seen the patient.  Recommend keeping to tomorrow to allow Tikosyn washout and improvement in QT.  In d/w the patient, medicine options as well as ablation, Recommend starting Multaq 48 hours after last dose of Tikosyn with plans for AFib ablation when scheduled allows.  I have sent a staff messag/and reached out to his RN to make arrangements for CT/ablation and to f/u with the patient.  Pt reports scheduled in a couple weeks for EGD/colonoscopy for anemia, reports tested negative for blood, has been taking iron.  His H/H here looks OK and much like his previous with the exception of Feb, ? spurious result.  He would prefer to hold off on GI w/u preferring to go forward with ablation, understanding need for uninterrupted xarelto prior to and for 3 months following his procedure.   For questions or updates, please contact  [place] Please consult [email or web address] for contact info under Cardiology/STEMI.   Signed, Baldwin Jamaica, PA-C  04/17/2017 10:37 AM   I have seen, examined the patient, and reviewed the above assessment and plan.  Changes to above are  made where necessary.  Pt with symptomatic recurrent afib and typical appearing atrial flutter.  Though mostly atrial flutter has been documented here, he has also had afib on review of his Apple Watch today.  He has failed medical therapy with tikosyn due to qt prolongation.  Would washout over 48 hours and then start multaq 400mg  BID.  Follow-up in AF clinic in 3-5 days for EKG and to schedule afib ablation with me.  Will require cardiac CT prior to ablation.  Lifestyle modification discussed at length today.  Co Sign: [name], MD 04/17/2017 8:45 PM

## 2017-04-17 NOTE — Progress Notes (Signed)
12 lead obtained prior to AM dose of Tiksoyn per order, Qtc measured at 551. Dr. Tamala Julian called and confirmed to hold AM dose and repeat 12 lead this afternoon. Will continue to monitor.

## 2017-04-17 NOTE — H&P (View-Only) (Signed)
Cardiology Consultation:   Patient ID: HANSEN CARINO; [id number]; Feb 15, 1940   Admit date: 04/15/2017 Date of Consult: 04/17/2017  Primary Care Provider: Josetta Huddle, MD Primary Cardiologist: Dr. Martinique   Patient Profile:   DERRAN SEAR is a 77 y.o. male with a hx of CAD (PCI w/BMS in 2012 to OM), DM, HTN, chronic CHF (diastolic), OSA, remote pulm embolism, ascending Aortic aneurysm (last 5.0cm) follows with Dr. Servando Snare), and paroxysmal Afib who is being seen today for the evaluation of AAD/Tikosyn  at the request of Dr. Tamala Julian.  History of Present Illness:   Mr. Pursell was woken from sleep with rapid palpitations and SOB, noted in ER w/AFib RVR, felt less symptomatic with rate control though remained aware of his palpitations.  He had no CP.  Cardiology was consulted, he reported compliance with his a/c and, noted sinus rates tend to be 50's and AF rate this admission was 140's on arrival and underwent DCCV with Dr. Sallyanne Kuster and planned for AAD with either amiodarone w/o BB given hx of brady or dofetilide post DCCV if QT allowed.  He was started on Tiksoyn 559mcg, had some QT prolongation and dose reduced to 237mcg. His AM dose not given today with QTC >500 and EP is asked to aid in management.  In review of record patient has been particularly frustrated of late with worsening or at least persistent DOE (I see this as a complaint though it seems for a coupl years), undergoing cardia w/u noting abn stress that led to [facility] with no significant disease and patent stent, LVEF was 55-60% with very mild AS, no DD was described though Dr. Sallyanne Kuster in his review felt there was clear evidence of this.  Patient reports for 2 years he has been unable to do even the simplest of things that previously were easy, of late even bring the trash cans down and walking back makes his feel very SOB.  No CP.  He reports a couple weeks ago a near syncopal spell upon standing after eating out.  Otherwise no  dizziness or syncope.    s/p IV >> PO lasix Fluid Neg cumulatively -2138ml LABS K+ 3.7 (has been getting replacement) Mag 2.0 BUN/Creat 21/1.19 (Calc CrCl is 90) WBC 6.4 H/H 12/38 Plts 178 BNP 191    AFib Hx: Onset uncertain, mentioned in record going back to 2012 [facility] 08/2016 A/c with xarelto  Past Medical History:  Diagnosis Date  . Anticoagulant long-term use   . Aortic root enlargement ([place])   . Ascending aortic aneurysm [facility])    recent scan in October 2012 showing no change; followed by Dr. Servando Snare  . ASCVD (arteriosclerotic cardiovascular disease)    Prior BMS to the 2nd OM in September 2012; with repeat cath in October showing patency  . CAD (coronary artery disease)    a. s/p BMS to 2nd OM in Sept 2012; b. LexiScan Myoview (12/2012):  Inf infarct; bowel and motion artifact make study difficult to interpret; no ischemia; not gated; Low Risk  . CHF (congestive heart failure) ([place])    no recent issues 10/13/14  . Colonic polyp   . Contact lens/glasses fitting   . Diabetes mellitus without complication (HCC)    metphormin, average 154 dx 2017  . Diastolic dysfunction   . Generalized headaches    neck stenosis  . GERD (gastroesophageal reflux disease)   . Hearing loss   . Hearing loss    more so on left  . Hemorrhoids   . Hypertension   .  IBS (irritable bowel syndrome)   . LVH (left ventricular hypertrophy)   . Mild intermittent asthma   . OA (osteoarthritis)   . Obesities, morbid ([place])   . OSA (obstructive sleep apnea)    PSG 03/30/97 AHI 21, BPAP 13/9  . PAF (paroxysmal atrial fibrillation) ([place])    a. on Xarelto b. s/p DCCV in 08/2016  . Pneumonia   . Prostate CA [facility])    Oncologist  DR. Daral[facility] 09/24/14, undetermined tx   prostate  . Pulmonary embolism ([place])    2008  . Sleep apnea   . SOB (shortness of breath)    on excertion  . Thoracic aortic aneurysm (HCC)    Aortic Size Index=     5.0    /Body surface area is 2.43 meters squared. = 2.05   < 2.75 cm/m2      4% risk per year 2.75 to 4.25          8% risk per year > 4.25 cm/m2    20% risk per year   Stable aneurysmal dilation of the ascending aorta with maximum AP diameter of 4.8 cm. Stable area of narrowing of the proximal most portion of the descending aorta measuring 2 cm., previously identified as an area of coarctation. No evidence of aortic dissection.  Coronary artery disease.  Normal appearance of the lungs.   Electronically Signed   By: Fidela Salisbury M.D.   On: 10/01/2014 08:50      Past Surgical History:  Procedure Laterality Date  . ACHILLES TENDON REPAIR    . AORTIC ARCH ANGIOGRAPHY N/A 03/13/2017   Procedure: AORTIC ARCH ANGIOGRAPHY;  Surgeon: Martinique, [name], MD;  Location: [place] [facility];  Service: Cardiovascular;  Laterality: N/A;  . APPENDECTOMY    . BACK SURGERY    . CARDIAC CATHETERIZATION  2006  . CARDIAC CATHETERIZATION  October 2012   Stent patent  . CARPAL TUNNEL RELEASE     LEFT  . CERVICAL SPINE SURGERY  06/02/2010   lower back and neck  . CHOLECYSTECTOMY    . COLONOSCOPY WITH PROPOFOL N/A 12/29/2014   Procedure: COLONOSCOPY WITH PROPOFOL;  Surgeon: Garlan Fair, MD;  Location: [facility];  Service: Endoscopy;  Laterality: N/A;  . CORONARY STENT PLACEMENT  Sept 2012   2nd OM with BMS  . EYE SURGERY     bilateral cataract  . HEMORROIDECTOMY    . LAMINECTOMY  05/30/2012   L 4 L5  . LAMINECTOMY WITH POSTERIOR LATERAL ARTHRODESIS LEVEL 3 N/A 10/18/2016   Procedure: Posterior Lateral Fusion - Lumbar One-Four, segmental instrumentation Lumbar One-Five,  decompression,;  Surgeon: Eustace Moore, MD;  Location: [facility] OR;  Service: Neurosurgery;  Laterality: N/A;  . LAPAROSCOPIC GASTRIC BANDING    . LEFT AND RIGHT HEART CATHETERIZATION WITH CORONARY ANGIOGRAM N/A 05/07/2014   Procedure: LEFT AND RIGHT HEART CATHETERIZATION WITH CORONARY ANGIOGRAM;  Surgeon: [name] Martinique, MD;  Location: [facility] CATH LAB;  Service: Cardiovascular;  Laterality: N/A;  .  LEFT HEART CATH AND CORONARY ANGIOGRAPHY N/A 03/13/2017   Procedure: LEFT HEART CATH AND CORONARY ANGIOGRAPHY;  Surgeon: Martinique, [name], MD;  Location: [place] [facility];  Service: Cardiovascular;  Laterality: N/A;  . LUMBAR LAMINECTOMY/DECOMPRESSION MICRODISCECTOMY N/A 05/04/2016   Procedure: Laminectomy and Foraminotomy - Thoracic twelve-Lumbar one -Posterior Fusion Lumbar one-two;  Surgeon: Eustace Moore, MD;  Location: [place];  Service: Neurosurgery;  Laterality: N/A;  . POSTERIOR LUMBAR FUSION  10/18/2016  . ROTATOR CUFF REPAIR  both  . TONSILLECTOMY    . TRIGGER FINGER RELEASE     LEFT  . UVULOPALATOPHARYNGOPLASTY    . VASECTOMY         Inpatient Medications: Scheduled Meds: . atorvastatin  5 mg Oral q1800  . dofetilide  250 mcg Oral BID  . furosemide  80 mg Oral BID  . losartan  25 mg Oral Daily  . metoprolol tartrate  25 mg Oral BID  . midazolam  2 mg Intravenous Once  . mometasone-formoterol  2 puff Inhalation BID  . montelukast  10 mg Oral QHS  . potassium chloride SA  40 mEq Oral BID  . rivaroxaban  20 mg Oral Q supper  . sodium chloride flush  3 mL Intravenous Q12H  . spironolactone  25 mg Oral Daily  . tamsulosin  0.4 mg Oral QPM  . topiramate  50 mg Oral BID   Continuous Infusions: . sodium chloride     PRN Meds: sodium chloride, acetaminophen, albuterol, hydrocerin, methocarbamol, nitroGLYCERIN, ondansetron (ZOFRAN) IV, sodium chloride flush, traMADol  Allergies:    Allergies  Allergen Reactions  . Adhesive [Tape] Itching and Rash  . Latex Itching, Rash and Other (See Comments)    Bandaids  . Morphine Itching    Social History:   Social History   Socioeconomic History  . Marital status: Married    Spouse name: Not on file  . Number of children: 3  . Years of education: Not on file  . Highest education level: Not on file  Occupational History  . Occupation: Retired from Scientist, clinical (histocompatibility and immunogenetics): RETIRED  Social Needs  . Financial resource strain:  Not on file  . Food insecurity:    Worry: Not on file    Inability: Not on file  . Transportation needs:    Medical: Not on file    Non-medical: Not on file  Tobacco Use  . Smoking status: Former Smoker    Packs/day: 1.50    Years: 30.00    Pack years: 45.00    Last attempt to quit: 01/24/1992    Years since quitting: 25.2  . Smokeless tobacco: Never Used  . Tobacco comment: Stopped in 1983  Substance and Sexual Activity  . Alcohol use: No  . Drug use: No  . Sexual activity: Yes  Lifestyle  . Physical activity:    Days per week: Not on file    Minutes per session: Not on file  . Stress: Not on file  Relationships  . Social connections:    Talks on phone: Not on file    Gets together: Not on file    Attends religious service: Not on file    Active member of club or organization: Not on file    Attends meetings of clubs or organizations: Not on file    Relationship status: Not on file  . Intimate partner violence:    Fear of current or ex partner: Not on file    Emotionally abused: Not on file    Physically abused: Not on file    Forced sexual activity: Not on file  Other Topics Concern  . Not on file  Social History Narrative  . Not on file    Family History:   Family History  Problem Relation Age of Onset  . Heart disease Mother   . Diabetes Mother   . Other Mother        stent placement  . Emphysema Father 4  . Heart attack Sister  ROS:  Please see the history of present illness.  All other ROS reviewed and negative.     Physical Exam/Data:   Vitals:   04/16/17 2311 04/17/17 0430 04/17/17 0433 04/17/17 0718  BP: 94/68  (!) 100/54 93/62  Pulse:    61  Resp:  18  18  Temp: 97.8 F (36.6 C)  97.9 F (36.6 C) (!) 97.5 F (36.4 C)  TempSrc: Axillary  Oral Oral  SpO2: 94% 92% 97% 98%  Weight:   261 lb 7.5 oz (118.6 kg)   Height:        Intake/Output Summary (Last 24 hours) at 04/17/2017 1037 Last data filed at 04/17/2017 0819 Gross per 24 hour    Intake 845 ml  Output 2200 ml  Net -1355 ml   Filed Weights   04/15/17 0741 04/16/17 0355 04/17/17 0433  Weight: 275 lb (124.7 kg) 263 lb 3.7 oz (119.4 kg) 261 lb 7.5 oz (118.6 kg)   Body mass index is 40.95 kg/m.  General:  Well nourished, well developed, in no acute distress HEENT: normal Lymph: no adenopathy Neck: no JVD Endocrine:  No thryomegaly Vascular: No carotid bruits Cardiac:  RRR; no murmur, no significant murmurs, no gallops or rubs Lungs:  CTA b/l, no wheezing, rhonchi or rales  Abd: soft, nontender, no hepatomegaly  Ext: no edema Musculoskeletal:  No deformities Skin: warm and dry  Neuro:  No gross focal abnormalities noted Psych:  Normal affect   EKG:  The EKG was personally reviewed and demonstrates:   AFlutter 131bpm, ICRBBB, QRS 67ms >> 04/15/17 post DCCV SR 62bpm, 1st degree AVblock, PR 271ms, QRS 135ms, Qtc 443ms Unable to locate 04/15/17 post 1st dose EKG 04/16/17 post #2 532mcg, SR, PACs, 76bpm, QTc 512ms F/u EKG pre #3 dose QTc was 484ms dose adjusted to 260mcg Post #4 dose (212mcg) EKG SR PACs, 75bpm, measured 506-537ms This AM pre Tiksoyn EKG was reported 544ms and dose held, I measure 456ms corrects to 568ms  Telemetry:  Telemetry was personally reviewed and demonstrates:   SR 60's generally, increasing frequency of PAC's, often grouped  Relevant CV Studies:  03/07/17: TTE Study Conclusions - Left ventricle: The cavity size was normal. Wall thickness was   increased in a pattern of mild LVH. Systolic function was normal.   The estimated ejection fraction was in the range of 55% to 60%.   Wall motion was normal; there were no regional wall motion   abnormalities. Left ventricular diastolic function parameters   were normal. - Aortic valve: There was very mild stenosis. Valve area (VTI):   1.81 cm^2. Valve area (Vmax): 1.94 cm^2. Valve area (Vmean): 1.85   cm^2. - Right atrium: The atrium was mildly dilated. - Atrial septum: No defect or  patent foramen ovale was identified. - Pulmonary arteries: PA peak pressure: 54 mm Hg (S).    03/13/17: LHC (done 2/2 abn stress and c/o DOE)  Previously placed Ost 2nd Mrg to 2nd Mrg stent (unknown type) is widely patent.  There is no aortic valve regurgitation.   1. No significant obstructive CAD 2. Aneurysmal thoracic aorta.   Plan: continue medical management . Consider pulmonary evaluation for symptoms of dyspnea.   Laboratory Data:  Chemistry Recent Labs  Lab 04/15/17 1434 04/16/17 0234 04/17/17 0253  NA 140 139 138  K 3.6 3.6 3.7  CL 107 108 104  CO2 24 22 24   GLUCOSE 136* 140* 148*  BUN 18 19 21*  CREATININE 1.15 1.13 1.19  CALCIUM 8.6*  8.9 8.8*  GFRNONAA >60 >60 58*  GFRAA >60 >60 >60  ANIONGAP 9 9 10     No results for input(s): PROT, ALBUMIN, AST, ALT, ALKPHOS, BILITOT in the last 168 hours. Hematology Recent Labs  Lab 04/15/17 0743  WBC 6.4  RBC 4.71  HGB 12.0*  HCT 38.9*  MCV 82.6  MCH 25.5*  MCHC 30.8  RDW 23.2*  PLT 178   Cardiac EnzymesNo results for input(s): TROPONINI in the last 168 hours.  Recent Labs  Lab 04/15/17 0759 04/15/17 1051  TROPIPOC 0.00 0.00    BNP Recent Labs  Lab 04/15/17 1038  BNP 191.1*    DDimer No results for input(s): DDIMER in the last 168 hours.  Radiology/Studies:   Dg Chest 2 View Result Date: 04/15/2017 CLINICAL DATA:  Chest pain, shortness of breath and rapid heart palpitations last night and this morning, history coronary artery disease, diabetes mellitus, hypertension, prostate cancer, thoracic aortic aneurysm EXAM: CHEST - 2 VIEW COMPARISON:  10/16/2016 FINDINGS: Enlargement of cardiac silhouette. Mediastinal contours and pulmonary vascularity normal. Pulmonary vascular congestion. Bibasilar atelectasis. No acute infiltrate, pleural effusion or pneumothorax. Scattered endplate spur formation thoracic spine. IMPRESSION: Enlargement of cardiac silhouette with pulmonary vascular congestion. Bibasilar  atelectasis. Electronically Signed   By: Lavonia Dana M.D.   On: 04/15/2017 09:39    Assessment and Plan:   1. Paroxysmal AFib     CHA2DS2 Vasc is 4, on xarelto, appropriately dosed  Persistent QT lengthening despite lowered dose of Tikosyn, no dose this AM EKG here looks to be a flutter, Aug 2018 is more difficult, though also looks like flutter  Will rever with Dr. Rayann Heman, he will see later today Consider amiodarone vs perhaps ablation Patient reports good compliance with CPAP Exertional intolerance limits his ability to exercise His new Apple watch suggests more Af frequency  The patient very much feels his AF is the limiting factor though difficult to make a clear connection Saw pulmonology earlier this month suspected multifactorial with deconditioning, asthma, and diastolic CHF, planned for "ONO with CPAP to determine if he might need to resume oxygen at night - he would likely need in lab titration study prior to restarting nocturnal oxygen"   2. CHF      Felt to have diastolic dysfunction by Dr. Sallyanne Kuster  3. CAD     Stable by last cath in feb   Dr. Rayann Heman has seen the patient.  Recommend keeping to tomorrow to allow Tikosyn washout and improvement in QT.  In d/w the patient, medicine options as well as ablation, Recommend starting Multaq 48 hours after last dose of Tikosyn with plans for AFib ablation when scheduled allows.  I have sent a staff messag/and reached out to his RN to make arrangements for CT/ablation and to f/u with the patient.  Pt reports scheduled in a couple weeks for EGD/colonoscopy for anemia, reports tested negative for blood, has been taking iron.  His H/H here looks OK and much like his previous with the exception of Feb, ? spurious result.  He would prefer to hold off on GI w/u preferring to go forward with ablation, understanding need for uninterrupted xarelto prior to and for 3 months following his procedure.   For questions or updates, please contact  [place] Please consult [email or web address] for contact info under Cardiology/STEMI.   Signed, Baldwin Jamaica, PA-C  04/17/2017 10:37 AM   I have seen, examined the patient, and reviewed the above assessment and plan.  Changes to above are  made where necessary.  Pt with symptomatic recurrent afib and typical appearing atrial flutter.  Though mostly atrial flutter has been documented here, he has also had afib on review of his Apple Watch today.  He has failed medical therapy with tikosyn due to qt prolongation.  Would washout over 48 hours and then start multaq 400mg  BID.  Follow-up in AF clinic in 3-5 days for EKG and to schedule afib ablation with me.  Will require cardiac CT prior to ablation.  Lifestyle modification discussed at length today.  Co Sign: [name], MD 04/17/2017 8:45 PM

## 2017-04-18 ENCOUNTER — Telehealth: Payer: Self-pay | Admitting: Cardiology

## 2017-04-18 ENCOUNTER — Encounter (HOSPITAL_COMMUNITY): Payer: Self-pay | Admitting: Cardiology

## 2017-04-18 DIAGNOSIS — I509 Heart failure, unspecified: Secondary | ICD-10-CM

## 2017-04-18 LAB — BASIC METABOLIC PANEL
Anion gap: 11 (ref 5–15)
BUN: 23 mg/dL — ABNORMAL HIGH (ref 6–20)
CO2: 23 mmol/L (ref 22–32)
Calcium: 8.7 mg/dL — ABNORMAL LOW (ref 8.9–10.3)
Chloride: 103 mmol/L (ref 101–111)
Creatinine, Ser: 1.15 mg/dL (ref 0.61–1.24)
GFR calc Af Amer: >60 mL/min (ref >60)
GFR calc non Af Amer: >60 mL/min (ref >60)
Glucose, Bld: 161 mg/dL — ABNORMAL HIGH (ref 65–99)
Potassium: 3.7 mmol/L (ref 3.5–5.1)
Sodium: 137 mmol/L (ref 135–145)

## 2017-04-18 LAB — MAGNESIUM: Magnesium: 2.2 mg/dL (ref 1.7–2.4)

## 2017-04-18 MED ORDER — DRONEDARONE HCL 400 MG PO TABS: 400.0000 mg | ORAL_TABLET | Freq: Two times a day (BID) | ORAL | 0 refills | Status: DC

## 2017-04-18 MED ORDER — OFF THE BEAT BOOK: 1.0000 | Freq: Once | 0 refills | Status: AC

## 2017-04-18 NOTE — Progress Notes (Signed)
Patient converted to NSR/SB 1st AVB with PAC'S approximately 0030 and maintained that for the rest of the night. EKG done this morning and QTC 454. Patient stable and I will continue to monitor.

## 2017-04-18 NOTE — Telephone Encounter (Signed)
   [facility] Pre-operative Risk Assessment    Request for surgical clearance:  1. What type of surgery is being performed? Colonoscopy/endoscopy for iron deficiency anemia   2. When is this surgery scheduled? 05/04/17   3. What type of clearance is required (medical clearance vs. Pharmacy clearance to hold med vs. Both)? Both   4. Are there any medications that need to be held prior to surgery and how long? Xarelto - 3 days prior   5. Practice name and name of physician performing surgery? Dr. Therisa Doyne @ [facility]    6. What is your office phone and fax number? (p) [phone] (f) [phone]   7. Anesthesia type (None, local, MAC, general) ? Not specified    Fidel Levy 04/18/2017, 10:58 AM  _________________________________________________________________   (provider comments below)

## 2017-04-18 NOTE — Discharge Summary (Addendum)
The patient has been seen in conjunction with Kathyrn Drown, NP-C. All aspects of care have been considered and discussed. The patient has been personally interviewed, examined, and all clinical data has been reviewed.   Please see my note from earlier in the day.  He is in sinus rhythm currently and feels well.  Multaq will be started tomorrow morning.  Plan ablation in mid April per EP service, Dr. Rayann Heman.  Discharge Summary    Patient ID: Carlos Dawson,  MRN: [id number], DOB/AGE: May 06, 1940 77 y.o.  Admit date: 04/15/2017 Discharge date: 04/18/2017  Primary Care Provider: Josetta Huddle Primary Cardiologist: No primary care provider on file.  Discharge Diagnoses    Active Problems:   OSA (obstructive sleep apnea)   Hypertension   Anticoagulant long-term use   CAD (coronary artery disease)   Thoracic aortic aneurysm (HCC)   Paroxysmal atrial fibrillation (HCC)   Chronic diastolic heart failure (HCC)   HLD (hyperlipidemia)   Sleep apnea   Diabetes mellitus without complication (HCC)   Heart failure (HCC)   Aortic root enlargement (HCC)   Atrial fibrillation with rapid ventricular response (HCC)  Allergies Allergies  Allergen Reactions  . Adhesive [Tape] Itching and Rash  . Latex Itching, Rash and Other (See Comments)    Bandaids  . Morphine Itching   Diagnostic Studies/Procedures    None   History of Present Illness     Carlos Dawson is a 77 y.o. male with a hx of CAD (PCI w/BMS in 2012 to OM), DM, HTN, chronic CHF (diastolic), OSA, remote pulm embolism, ascending Aortic aneurysm (last 5.0 cm followed by Dr. Servando Snare) and paroxysmal Afib who is being followed by Cardiology and EP for the evaluation and management of AAD/Tikosyn/Multaq and possible Afib ablation.   On 04/15/17 the patient woke from sleep around 2am with rapid palpitations and shortness of breath that slowly worsened throughout the night and into the morning.  His symptoms were not  associated with chest pain. Around 5am the patient checked his HR on his Apple watch which showed that he had rates in the 130's.  Due to symptom progression and know hx of AFib, he presented to [facility] emergency room for further evaluation.   Hospital Course    In the ED, an EKG was performed which revealed that he was in atrial fibrillation with rapid ventricular response. He had some improvement in symptoms with rate control, but remained short of breath and was still aware of palpitations with a heart rate around 90 bpm at consultation.  IV Lopressor was given in the ED for rate control and IV Lasix for fluid volume overload. A CXR was performed which showed cardiac enlargement with pulmonary vascular congestion. Troponin levels were negative x 2. He denied  recent respiratory illnesses or cardiac complaints. He reported feeling well prior to admission but has chronic NYHA functional class II exertional dyspnea. He reported compliance with daily oral anticoagulant with no reports of  bleeding complications.His palpitations were reported as occurring intermittently over the last 8 months since his previous presentation with atrial fibrillation and typically last for approximately 35-40 minutes, resolving spontaneously on its own.   Cardiology was called to the ED for consultation. MD assessed pt and made the decision to proceed with DCCV at bedside given compliance with anticoagulation therapy and symptomatic presentation. Pt was successfully cardioverted to NSR with frequent PAC's on 04/15/17 with initial unsuccessful shock of 120J then subsequent 200J successful shock. His Qtc per MD note  at that time was 415ms with plans to start pt on Tikosyn therapy at 500 mg. Per MD note, his QT interval was long on his most recent ECG, but this is an exaggeration due to the flutter-like atrial activity. If after cardioversion his QT is normal, is as expected will be, dofetilide seems to be the best medicaiton choice.  This was discussed with Carlos Dawson and they understand that initiation of this antiarrhythmic requires a mandatory 72-hour in hospital admission with observation on telemetry.  Unfortunately on 04/16/17 the patient was noted to have QT prolongation >565ms on EKG. The dose was reduced to 250 mg per protocol with strict QTc observation. On 04/17/17 the pt was back in atrial fibrillation and given his failed attempt on Tikosyn, EP was consulted for further medication management.   Dr. Rayann Heman reviewed the case on 04/17/17 with recommendations to stop the Tikosyn (last dose noted to be 04/16/17 at 20340 with appropriate 48H washout and the start Multaq 400 mg PO BID). This will need to be started on 04/19/17 in the AM. The patient has follow up appointment with Afib clinic Roderic Palau, NP) on 04/23/17 at 1330. Pt to be seen in Afib clinic with EKG to be performed. Afib ablation to be scheduled at that time with Dr. Rayann Heman (per MD note).   Of note, he has had recent ischemic evaluation in 02/2017 with a Lexiscan Myoview with a small inferoapical reversible defect and EF of 61% and an echocardiogram with normal left ventricular systolic and "normal" diastolic function (although per Dr. Sallyanne Kuster, there is clear evidence of diastolic dysfunction with elevated filling pressures), very mild aortic stenosis, and moderate pulmonary hypertension, PA pressure 54 mmHg).  Due to the abnormality on the nuclear stress test he underwent cardiac catheterization that showed no significant coronary stenoses, a widely patent old stent in the OM 2 and confirm the presence of his aneurysmal thoracic aorta.  Left ventricular end-diastolic pressure was not recorded.   Medication plan: Multaq 400 mg BID on 04/19/17 in the morning. His last dose of Tikosyn is documented to be 250 mg on 04/16/17 at 2034  Consultants: EP  The patient was seen and examined by Dr. Tamala Julian who feels that he is stable and ready for discharge today,  04/18/17 after final recommendations from EP have been made and reviewed.  _____________  Discharge Vitals Blood pressure (!) 100/53, pulse (!) 58, temperature 97.6 F (36.4 C), temperature source Oral, resp. rate 13, height 5\' 7"  (1.702 m), weight 262 lb 5.6 oz (119 kg), SpO2 96 %.  Filed Weights   04/16/17 0355 04/17/17 0433 04/18/17 0341  Weight: 263 lb 3.7 oz (119.4 kg) 261 lb 7.5 oz (118.6 kg) 262 lb 5.6 oz (119 kg)   Labs & Radiologic Studies    Basic Metabolic Panel Recent Labs    04/17/17 0253 04/18/17 0234  NA 138 137  K 3.7 3.7  CL 104 103  CO2 24 23  GLUCOSE 148* 161*  BUN 21* 23*  CREATININE 1.19 1.15  CALCIUM 8.8* 8.7*  MG 2.0 2.2   _____________  Dg Chest 2 View  Result Date: 04/15/2017 CLINICAL DATA:  Chest pain, shortness of breath and rapid heart palpitations last night and this morning, history coronary artery disease, diabetes mellitus, hypertension, prostate cancer, thoracic aortic aneurysm EXAM: CHEST - 2 VIEW COMPARISON:  10/16/2016 FINDINGS: Enlargement of cardiac silhouette. Mediastinal contours and pulmonary vascularity normal. Pulmonary vascular congestion. Bibasilar atelectasis. No acute infiltrate, pleural effusion or pneumothorax. Scattered  endplate spur formation thoracic spine. IMPRESSION: Enlargement of cardiac silhouette with pulmonary vascular congestion. Bibasilar atelectasis. Electronically Signed   By: Lavonia Dana M.D.   On: 04/15/2017 09:39   Disposition   Pt is being discharged home today in good condition.  Follow-up Plans & Appointments    Follow-up Information    [facility] up on 04/23/2017.   Specialty:  Cardiology Why:  at 1:30PM  Contact information: [address] [id number] [place] [phone]         Discharge Instructions    Call MD for:  difficulty breathing, headache or visual disturbances   Complete by:  As directed    Call MD for:  redness,  tenderness, or signs of infection (pain, swelling, redness, odor or green/yellow discharge around incision site)   Complete by:  As directed    Call MD for:  severe uncontrolled pain   Complete by:  As directed    Diet - low sodium heart healthy   Complete by:  As directed    Increase activity slowly   Complete by:  As directed      Discharge Medications   Allergies as of 04/18/2017      Reactions   Adhesive [tape] Itching, Rash   Latex Itching, Rash, Other (See Comments)   Bandaids   Morphine Itching      Medication List    TAKE these medications   acetaminophen 500 MG tablet Commonly known as:  TYLENOL Take 1,000 mg by mouth every 8 (eight) hours as needed for mild pain or headache.   albuterol 108 (90 Base) MCG/ACT inhaler Commonly known as:  PROVENTIL HFA;VENTOLIN HFA Inhale 2 puffs into the lungs every 4 (four) hours as needed for wheezing or shortness of breath.   atorvastatin 10 MG tablet Commonly known as:  LIPITOR Take 5 mg by mouth daily at 6 PM.   augmented betamethasone dipropionate 0.05 % cream Commonly known as:  DIPROLENE-AF Apply 1 application topically 2 (two) times daily as needed for itching.   budesonide-formoterol 80-4.5 MCG/ACT inhaler Commonly known as:  SYMBICORT Inhale 2 puffs into the lungs 2 (two) times daily.   dronedarone 400 MG tablet Commonly known as:  MULTAQ Take 1 tablet (400 mg total) by mouth 2 (two) times daily with a meal. Start taking on:  04/19/2017   eucerin cream Apply 1 application topically as needed for dry skin.   ferrous gluconate 324 MG tablet Commonly known as:  FERGON Take 324 mg by mouth 2 (two) times daily.   furosemide 40 MG tablet Commonly known as:  LASIX Take 80 mg by mouth 2 (two) times daily.   KLOR-CON M20 20 MEQ tablet Generic drug:  potassium chloride SA TAKE 2 TABLETS BY MOUTH TWICE A DAY What changed:    how much to take  how to take this  when to take this   losartan 25 MG  tablet Commonly known as:  COZAAR TAKE 1 TABLET BY MOUTH EVERY DAY What changed:    how much to take  how to take this  when to take this   methocarbamol 500 MG tablet Commonly known as:  ROBAXIN Take 1 tablet (500 mg total) by mouth every 8 (eight) hours as needed for muscle spasms.   metoprolol tartrate 25 MG tablet Commonly known as:  LOPRESSOR TAKE 1 TABLET BY MOUTH TWICE A DAY.TAKE EXTRA 1/2 TAB IF SBP>110 What changed:    how much to take  how to take this  when to take this  additional instructions   montelukast 10 MG tablet Commonly known as:  SINGULAIR Take 10 mg by mouth at bedtime.   nitroGLYCERIN 0.4 MG SL tablet Commonly known as:  NITROSTAT Place 1 tablet (0.4 mg total) under the tongue every 5 (five) minutes as needed for chest pain.   off the beat book Misc 1 each by Does not apply route once for 1 dose.   omeprazole 20 MG capsule Commonly known as:  PRILOSEC Take 20 mg by mouth daily.   psyllium 58.6 % packet Commonly known as:  METAMUCIL Take 1 packet by mouth daily.   rivaroxaban 20 MG Tabs tablet Commonly known as:  XARELTO Take 1 tablet (20 mg total) by mouth daily with supper.   sildenafil 100 MG tablet Commonly known as:  VIAGRA Take 100 mg by mouth as needed for erectile dysfunction.   spironolactone 25 MG tablet Commonly known as:  ALDACTONE Take 25 mg by mouth daily.   SYSTANE OP Place 1 drop into both eyes daily as needed (for dry eyes).   tamsulosin 0.4 MG Caps capsule Commonly known as:  FLOMAX Take 0.4 mg by mouth every evening.   topiramate 25 MG capsule Commonly known as:  TOPAMAX Take 50 mg by mouth 2 (two) times daily.   traMADol 50 MG tablet Commonly known as:  ULTRAM Take 50 mg by mouth every 12 (twelve) hours as needed for moderate pain.   Vitamin D-3 5000 units Tabs Take 5,000 Units by mouth daily.       Outstanding Labs/Studies   Will require cardiac CT prior to ablation per Dr. Rayann Heman  note   Duration of Discharge Encounter   Greater than 30 minutes including physician time.  SignedKathyrn Drown NP 04/18/2017, 12:31 PM

## 2017-04-18 NOTE — Discharge Instructions (Signed)

## 2017-04-18 NOTE — Progress Notes (Signed)
Cardiology Note  Greatly appreciate the electrophysiology consultation and recommendations.  We will need to wait 48 hours before starting Multaq.  Multaq can be started as an outpatient without monitoring.  If no objection from EP, the patient will be discharged today.

## 2017-04-19 ENCOUNTER — Telehealth: Payer: Self-pay

## 2017-04-19 DIAGNOSIS — I480 Paroxysmal atrial fibrillation: Secondary | ICD-10-CM

## 2017-04-19 NOTE — Telephone Encounter (Signed)
Returned call to Pt.  Pt notifying this nurse that his appt with Dr. Servando Snare had been moved out to May.  Thanked Pt for call.

## 2017-04-19 NOTE — Telephone Encounter (Signed)
Outreach made to Pt and wife.  Pt would like to schedule afib ablation for April 19th, 2nd case, arrival time of 8:00 am.   Will need cardiac CT prior to procedure. Pt also follows with Servando Snare and needs a aorta CT with him soon.  Will discuss with Dr. Meda Coffee. Pt will come to office 04/30/2017 for lab work.  Pt and family on MyChart, will send instructions via that route.

## 2017-04-19 NOTE — Telephone Encounter (Signed)
New message ° °Pt verbalized that he is returning call for RN °

## 2017-04-20 NOTE — Telephone Encounter (Signed)
Pt has a post hospital visit scheduled for 04/23/17- Clearance for Endoscopy/ colonoscopy and hold Xarelto will be determined then  Kerin Ransom PA-C 04/20/2017 4:43 PM

## 2017-04-23 ENCOUNTER — Encounter (HOSPITAL_COMMUNITY): Payer: Self-pay | Admitting: Nurse Practitioner

## 2017-04-23 ENCOUNTER — Ambulatory Visit (HOSPITAL_COMMUNITY)
Admit: 2017-04-23 | Discharge: 2017-04-23 | Disposition: A | Discharge status: Home / Self Care | Payer: Medicare Other | Attending: Nurse Practitioner | Admitting: Nurse Practitioner

## 2017-04-23 VITALS — BP 114/60 | HR 57 | Ht 67.0 in | Wt 270.6 lb

## 2017-04-23 DIAGNOSIS — I491 Atrial premature depolarization: Secondary | ICD-10-CM | POA: Insufficient documentation

## 2017-04-23 DIAGNOSIS — K219 Gastro-esophageal reflux disease without esophagitis: Secondary | ICD-10-CM | POA: Insufficient documentation

## 2017-04-23 DIAGNOSIS — I712 Thoracic aortic aneurysm, without rupture: Secondary | ICD-10-CM | POA: Insufficient documentation

## 2017-04-23 DIAGNOSIS — I110 Hypertensive heart disease with heart failure: Secondary | ICD-10-CM | POA: Insufficient documentation

## 2017-04-23 DIAGNOSIS — I4581 Long QT syndrome: Secondary | ICD-10-CM | POA: Diagnosis not present

## 2017-04-23 DIAGNOSIS — I509 Heart failure, unspecified: Secondary | ICD-10-CM | POA: Insufficient documentation

## 2017-04-23 DIAGNOSIS — I480 Paroxysmal atrial fibrillation: Secondary | ICD-10-CM | POA: Diagnosis not present

## 2017-04-23 DIAGNOSIS — Z7901 Long term (current) use of anticoagulants: Secondary | ICD-10-CM | POA: Insufficient documentation

## 2017-04-23 DIAGNOSIS — E119 Type 2 diabetes mellitus without complications: Secondary | ICD-10-CM | POA: Insufficient documentation

## 2017-04-23 DIAGNOSIS — G4733 Obstructive sleep apnea (adult) (pediatric): Secondary | ICD-10-CM | POA: Diagnosis not present

## 2017-04-23 DIAGNOSIS — K589 Irritable bowel syndrome without diarrhea: Secondary | ICD-10-CM | POA: Insufficient documentation

## 2017-04-23 DIAGNOSIS — Z981 Arthrodesis status: Secondary | ICD-10-CM | POA: Insufficient documentation

## 2017-04-23 DIAGNOSIS — Z87891 Personal history of nicotine dependence: Secondary | ICD-10-CM | POA: Insufficient documentation

## 2017-04-23 DIAGNOSIS — M1990 Unspecified osteoarthritis, unspecified site: Secondary | ICD-10-CM | POA: Diagnosis not present

## 2017-04-23 DIAGNOSIS — Z79899 Other long term (current) drug therapy: Secondary | ICD-10-CM | POA: Diagnosis not present

## 2017-04-23 DIAGNOSIS — R0602 Shortness of breath: Secondary | ICD-10-CM | POA: Insufficient documentation

## 2017-04-23 DIAGNOSIS — I2510 Atherosclerotic heart disease of native coronary artery without angina pectoris: Secondary | ICD-10-CM | POA: Diagnosis not present

## 2017-04-23 DIAGNOSIS — R001 Bradycardia, unspecified: Secondary | ICD-10-CM | POA: Diagnosis not present

## 2017-04-23 DIAGNOSIS — Z8546 Personal history of malignant neoplasm of prostate: Secondary | ICD-10-CM | POA: Diagnosis not present

## 2017-04-23 NOTE — Progress Notes (Signed)
Primary Care Physician: Josetta Huddle, MD Referring Physician: [facility] F/u EP: Dr. Rayann Heman Cardiologist: Dr. Martinique   Carlos Dawson is a 77 y.o. male with a h/o CAD,CHF, ascending aortic aneurysm, DM, that is in the afib clinic for f/u start of multaq, pending ablation 4/19. On 04/15/17 the patient woke from sleep around 2am with rapid palpitations and shortness of breath that slowly worsened throughout the night and into the morning.  His symptoms were not associated with chest pain. Around 5am the patient checked his HR on his Apple watch which showed that he had rates in the 130's. Due to symptom progression and  hx of AFib, he presented to [place] emergency room for further evaluation. He was tried on tikosyn but qtc lengthened over 500 ms, so drug was not used and it was decided to use Multaq after 48 hour washout. Today, he remains in SR, on Multaq. Even though he remains in SR, he is still short of breath with exertion. States shortness of breath with exertion has been more of an issue for the last year. He feels his weight is stable at home since d/c from the hospital.  Today, he denies symptoms of palpitations, chest pain, shortness of breath, orthopnea, PND, lower extremity edema, dizziness, presyncope, syncope, or neurologic sequela. The patient is tolerating medications without difficulties and is otherwise without complaint today.   Past Medical History:  Diagnosis Date  . Anticoagulant long-term use   . Aortic root enlargement ([place])   . Ascending aortic aneurysm [facility])    recent scan in October 2012 showing no change; followed by Dr. Servando Snare  . ASCVD (arteriosclerotic cardiovascular disease)    Prior BMS to the 2nd OM in September 2012; with repeat cath in October showing patency  . CAD (coronary artery disease)    a. s/p BMS to 2nd OM in Sept 2012; b. LexiScan Myoview (12/2012):  Inf infarct; bowel and motion artifact make study difficult to interpret; no ischemia; not gated; Low  Risk  . CHF (congestive heart failure) ([place])    no recent issues 10/13/14  . Colonic polyp   . Contact lens/glasses fitting   . Diabetes mellitus without complication (HCC)    metphormin, average 154 dx 2017  . Diastolic dysfunction   . Generalized headaches    neck stenosis  . GERD (gastroesophageal reflux disease)   . Hearing loss   . Hearing loss    more so on left  . Hemorrhoids   . Hypertension   . IBS (irritable bowel syndrome)   . LVH (left ventricular hypertrophy)   . Mild intermittent asthma   . OA (osteoarthritis)   . Obesities, morbid ([place])   . OSA (obstructive sleep apnea)    PSG 03/30/97 AHI 21, BPAP 13/9  . PAF (paroxysmal atrial fibrillation) ([employer])    a. on Xarelto b. s/p DCCV in 08/2016; b. Tikosyn failed 04/16/17 with plans for Multaq and possible Afib ablation with Dr. Rayann Heman  . Pneumonia   . Prostate CA [facility])    Oncologist  DR. Daralene Mil[facility] 09/24/14, undetermined tx   prostate  . Pulmonary embolism ([place])    2008  . Sleep apnea   . SOB (shortness of breath)    on excertion  . Thoracic aortic aneurysm (HCC)    Aortic Size Index=     5.0    /Body surface area is 2.43 meters squared. = 2.05  < 2.75 cm/m2      4% risk per year 2.75 to  4.25          8% risk per year > 4.25 cm/m2    20% risk per year   Stable aneurysmal dilation of the ascending aorta with maximum AP diameter of 4.8 cm. Stable area of narrowing of the proximal most portion of the descending aorta measuring 2 cm., previously identified as an area of coarctation. No evidence of aortic dissection.  Coronary artery disease.  Normal appearance of the lungs.   Electronically Signed   By: Fidela Salisbury M.D.   On: 10/01/2014 08:50     Past Surgical History:  Procedure Laterality Date  . ACHILLES TENDON REPAIR    . AORTIC ARCH ANGIOGRAPHY N/A 03/13/2017   Procedure: AORTIC ARCH ANGIOGRAPHY;  Surgeon: Martinique, [name], MD;  Location: [facility];  Service: Cardiovascular;  Laterality: N/A;  .  APPENDECTOMY    . BACK SURGERY    . CARDIAC CATHETERIZATION  2006  . CARDIAC CATHETERIZATION  October 2012   Stent patent  . CARPAL TUNNEL RELEASE     LEFT  . CERVICAL SPINE SURGERY  06/02/2010   lower back and neck  . CHOLECYSTECTOMY    . COLONOSCOPY WITH PROPOFOL N/A 12/29/2014   Procedure: COLONOSCOPY WITH PROPOFOL;  Surgeon: Garlan Fair, MD;  Location: [facility];  Service: Endoscopy;  Laterality: N/A;  . CORONARY STENT PLACEMENT  Sept 2012   2nd OM with BMS  . EYE SURGERY     bilateral cataract  . HEMORROIDECTOMY    . LAMINECTOMY  05/30/2012   L 4 L5  . LAMINECTOMY WITH POSTERIOR LATERAL ARTHRODESIS LEVEL 3 N/A 10/18/2016   Procedure: Posterior Lateral Fusion - Lumbar One-Four, segmental instrumentation Lumbar One-Five,  decompression,;  Surgeon: Eustace Moore, MD;  [facility]: [facility] OR;  Service: Neurosurgery;  Laterality: N/A;  . LAPAROSCOPIC GASTRIC BANDING    . LEFT AND RIGHT HEART CATHETERIZATION WITH CORONARY ANGIOGRAM N/A 05/07/2014   Procedure: LEFT AND RIGHT HEART CATHETERIZATION WITH CORONARY ANGIOGRAM;  Surgeon: [name] Martinique, MD;  Location: [facility] CATH LAB;  Service: Cardiovascular;  Laterality: N/A;  . LEFT HEART CATH AND CORONARY ANGIOGRAPHY N/A 03/13/2017   Procedure: LEFT HEART CATH AND CORONARY ANGIOGRAPHY;  Surgeon: Martinique, [name], MD;  Location: [place] [facility];  Service: Cardiovascular;  Laterality: N/A;  . LUMBAR LAMINECTOMY/DECOMPRESSION MICRODISCECTOMY N/A 05/04/2016   Procedure: Laminectomy and Foraminotomy - Thoracic twelve-Lumbar one -Posterior Fusion Lumbar one-two;  Surgeon: Eustace Moore, MD;  Location: [place];  Service: Neurosurgery;  Laterality: N/A;  . POSTERIOR LUMBAR FUSION  10/18/2016  . ROTATOR CUFF REPAIR     both  . TONSILLECTOMY    . TRIGGER FINGER RELEASE     LEFT  . UVULOPALATOPHARYNGOPLASTY    . VASECTOMY      Current Outpatient Medications  Medication Sig Dispense Refill  . acetaminophen (TYLENOL) 500 MG tablet Take 1,000 mg by mouth  every 8 (eight) hours as needed for mild pain or headache.     . albuterol (PROVENTIL HFA;VENTOLIN HFA) 108 (90 Base) MCG/ACT inhaler Inhale 2 puffs into the lungs every 4 (four) hours as needed for wheezing or shortness of breath.    Marland Kitchen atorvastatin (LIPITOR) 10 MG tablet Take 5 mg by mouth daily at 6 PM.     . augmented betamethasone dipropionate (DIPROLENE-AF) 0.05 % cream Apply 1 application topically 2 (two) times daily as needed for itching.  3  . budesonide-formoterol (SYMBICORT) 80-4.5 MCG/ACT inhaler Inhale 2 puffs into the lungs 2 (two) times  daily.     . Cholecalciferol (VITAMIN D-3) 5000 units TABS Take 5,000 Units by mouth daily.    Marland Kitchen dronedarone (MULTAQ) 400 MG tablet Take 1 tablet (400 mg total) by mouth 2 (two) times daily with a meal. 60 tablet 0  . ferrous gluconate (FERGON) 324 MG tablet Take 324 mg by mouth 2 (two) times daily.  1  . furosemide (LASIX) 40 MG tablet Take 80 mg by mouth 2 (two) times daily.    Marland Kitchen KLOR-CON M20 20 MEQ tablet TAKE 2 TABLETS BY MOUTH TWICE A DAY (Patient taking differently: Take 40 MEQ by mouth twice daily) 360 tablet 1  . losartan (COZAAR) 25 MG tablet TAKE 1 TABLET BY MOUTH EVERY DAY (Patient taking differently: Take 25 mg by mouth once daily) 90 tablet 1  . methocarbamol (ROBAXIN) 500 MG tablet Take 1 tablet (500 mg total) by mouth every 8 (eight) hours as needed for muscle spasms. 60 tablet 1  . metoprolol tartrate (LOPRESSOR) 25 MG tablet TAKE 1 TABLET BY MOUTH TWICE A DAY.TAKE EXTRA 1/2 TAB IF SBP>110 (Patient taking differently: Take 25 mg by mouth 2 (two) times daily. TAKE EXTRA 12.5 MG IF SBP>110) 180 tablet 3  . montelukast (SINGULAIR) 10 MG tablet Take 10 mg by mouth at bedtime.    . nitroGLYCERIN (NITROSTAT) 0.4 MG SL tablet Place 1 tablet (0.4 mg total) under the tongue every 5 (five) minutes as needed for chest pain. 25 tablet 5  . omeprazole (PRILOSEC) 20 MG capsule Take 20 mg by mouth daily.    Vladimir Faster Glycol-Propyl Glycol (SYSTANE OP)  Place 1 drop into both eyes daily as needed (for dry eyes).     . psyllium (METAMUCIL) 58.6 % packet Take 1 packet by mouth daily.    . rivaroxaban (XARELTO) 20 MG TABS tablet Take 1 tablet (20 mg total) by mouth daily with supper. 30 tablet   . sildenafil (VIAGRA) 100 MG tablet Take 100 mg by mouth as needed for erectile dysfunction.     . Skin Protectants, Misc. (EUCERIN) cream Apply 1 application topically as needed for dry skin.    Marland Kitchen spironolactone (ALDACTONE) 25 MG tablet Take 25 mg by mouth daily.    . tamsulosin (FLOMAX) 0.4 MG CAPS Take 0.4 mg by mouth every evening.     . topiramate (TOPAMAX) 25 MG capsule Take 50 mg by mouth 2 (two) times daily.     . traMADol (ULTRAM) 50 MG tablet Take 50 mg by mouth every 12 (twelve) hours as needed for moderate pain.      No current facility-administered medications for this encounter.     Allergies  Allergen Reactions  . Adhesive [Tape] Itching and Rash  . Latex Itching, Rash and Other (See Comments)    Bandaids  . Morphine Itching    Social History   Socioeconomic History  . Marital status: Married    Spouse name: Not on file  . Number of children: 3  . Years of education: Not on file  . Highest education level: Not on file  Occupational History  . Occupation: Retired from Scientist, clinical (histocompatibility and immunogenetics): RETIRED  Social Needs  . Financial resource strain: Not on file  . Food insecurity:    Worry: Not on file    Inability: Not on file  . Transportation needs:    Medical: Not on file    Non-medical: Not on file  Tobacco Use  . Smoking status: Former Smoker    Packs/day: 1.50  Years: 30.00    Pack years: 45.00    Last attempt to quit: 01/24/1992    Years since quitting: 25.2  . Smokeless tobacco: Never Used  . Tobacco comment: Stopped in 1983  Substance and Sexual Activity  . Alcohol use: No  . Drug use: No  . Sexual activity: Yes  Lifestyle  . Physical activity:    Days per week: Not on file    Minutes per session: Not on file    . Stress: Not on file  Relationships  . Social connections:    Talks on phone: Not on file    Gets together: Not on file    Attends religious service: Not on file    Active member of club or organization: Not on file    Attends meetings of clubs or organizations: Not on file    Relationship status: Not on file  . Intimate partner violence:    Fear of current or ex partner: Not on file    Emotionally abused: Not on file    Physically abused: Not on file    Forced sexual activity: Not on file  Other Topics Concern  . Not on file  Social History Narrative  . Not on file    Family History  Problem Relation Age of Onset  . Heart disease Mother   . Diabetes Mother   . Other Mother        stent placement  . Emphysema Father 50  . Heart attack Sister     ROS- All systems are reviewed and negative except as per the HPI above  Physical Exam: Vitals:   04/23/17 1335  BP: 114/60  Pulse: (!) 57  SpO2: 97%  Weight: 270 lb 9.6 oz (122.7 kg)  Height: 5\' 7"  (1.702 m)   Wt Readings from Last 3 Encounters:  04/23/17 270 lb 9.6 oz (122.7 kg)  04/18/17 262 lb 5.6 oz (119 kg)  04/04/17 275 lb 3.2 oz (124.8 kg)    Labs: Lab Results  Component Value Date   NA 137 04/18/2017   K 3.7 04/18/2017   CL 103 04/18/2017   CO2 23 04/18/2017   GLUCOSE 161 (H) 04/18/2017   BUN 23 (H) 04/18/2017   CREATININE 1.15 04/18/2017   CALCIUM 8.7 (L) 04/18/2017   MG 2.2 04/18/2017   Lab Results  Component Value Date   INR 1.3 (H) 03/08/2017   Lab Results  Component Value Date   CHOL 118 01/07/2013   HDL 40 01/07/2013   LDLCALC 51 01/07/2013   TRIG 133 01/07/2013     GEN- The patient is well appearing, alert and oriented x 3 today.   Head- normocephalic, atraumatic Eyes-  Sclera clear, conjunctiva pink Ears- hearing intact Oropharynx- clear Neck- supple, no JVP Lymph- no cervical lymphadenopathy Lungs- Clear to ausculation bilaterally, normal work of breathing Heart- Regular rate  and rhythm, no murmurs, rubs or gallops, PMI not laterally displaced GI- soft, NT, ND, + BS Extremities- no clubbing, cyanosis, or edema MS- no significant deformity or atrophy Skin- no rash or lesion Psych- euthymic mood, full affect Neuro- strength and sensation are intact  EKG- Sinus brady at 57 bpm, PR int 238 ms, qrs int 98 ms, qtc 414 ms Epic records reviewed    Assessment and Plan: 1. Paroxysmal symptomatic afib Failed Tikosyn 2/2 prolonged qtc Now in SR on Multaq 400 mg bid Continue metoprolol at 25 mg bid Continue xarelto 20 mg qd for chadsvasc score of at least 6 Pt is  pending ablation 4/19 with lab on 4/8 and  CT, date pending  Carlos Dawson. Carlos Dawson, [facility] [address] [place], [place] [address] [phone]

## 2017-04-24 ENCOUNTER — Ambulatory Visit: Payer: Medicare Other | Admitting: Cardiothoracic Surgery

## 2017-04-26 ENCOUNTER — Telehealth: Payer: Self-pay

## 2017-04-26 NOTE — Telephone Encounter (Signed)
Call placed to Dr. Everrett Coombe office per Pt request.  Requested appt with Dr. Servando Snare be moved to October d/t Pt with ablation and many f/u's coming up.   Office states that should be fine. Notified Pt.  No needs at this time.

## 2017-04-27 DIAGNOSIS — I4891 Unspecified atrial fibrillation: Secondary | ICD-10-CM | POA: Diagnosis not present

## 2017-04-27 DIAGNOSIS — D509 Iron deficiency anemia, unspecified: Secondary | ICD-10-CM | POA: Diagnosis not present

## 2017-04-27 NOTE — Telephone Encounter (Signed)
   Primary Cardiologist: Peter Martinique, MD  Chart reviewed as part of pre-operative protocol coverage. Patient was contacted 04/27/2017 in reference to pre-operative risk assessment for pending surgery as outlined below.  BARRON VANLOAN was last seen on 04/23/2017 by Roderic Palau, NP.    He has an atrial fibrillation ablation scheduled for 05/11/2017.  He may not come off anticoagulation for 3 weeks before that, over 3 months afterwards.  The colonoscopy will need to be rescheduled.  Pre-op covering staff: - Please call patient to inform them. - Please contact requesting surgeon's office via preferred method (i.e, phone, fax) to inform them of need to postpone the procedure.  Rosaria Ferries, PA-C 04/27/2017, 1:57 PM

## 2017-04-27 NOTE — Telephone Encounter (Signed)
Called Dr Encarnacion Slates office to cancel colonoscopy and was informed that patient called and canceled surgery already.

## 2017-04-30 ENCOUNTER — Other Ambulatory Visit: Payer: Medicare Other | Admitting: *Deleted

## 2017-04-30 DIAGNOSIS — I480 Paroxysmal atrial fibrillation: Secondary | ICD-10-CM

## 2017-05-01 DIAGNOSIS — M48062 Spinal stenosis, lumbar region with neurogenic claudication: Secondary | ICD-10-CM | POA: Diagnosis not present

## 2017-05-01 LAB — CBC WITH DIFFERENTIAL/PLATELET
Basophils Absolute: 0 10*3/uL (ref 0.0–0.2)
Basos: 0 %
EOS (ABSOLUTE): 0.2 10*3/uL (ref 0.0–0.4)
Eos: 3 %
Hematocrit: 33.4 % — ABNORMAL LOW (ref 37.5–51.0)
Hemoglobin: 10.6 g/dL — ABNORMAL LOW (ref 13.0–17.7)
Immature Grans (Abs): 0 10*3/uL (ref 0.0–0.1)
Immature Granulocytes: 0 %
Lymphocytes Absolute: 2 10*3/uL (ref 0.7–3.1)
Lymphs: 32 %
MCH: 27.7 pg (ref 26.6–33.0)
MCHC: 31.7 g/dL (ref 31.5–35.7)
MCV: 87 fL (ref 79–97)
Monocytes Absolute: 0.7 10*3/uL (ref 0.1–0.9)
Monocytes: 11 %
Neutrophils Absolute: 3.5 10*3/uL (ref 1.4–7.0)
Neutrophils: 54 %
Platelets: 128 10*3/uL — ABNORMAL LOW (ref 150–379)
RBC: 3.82 x10E6/uL — ABNORMAL LOW (ref 4.14–5.80)
RDW: 24.2 % — ABNORMAL HIGH (ref 12.3–15.4)
WBC: 6.5 10*3/uL (ref 3.4–10.8)

## 2017-05-01 LAB — BASIC METABOLIC PANEL
BUN/Creatinine Ratio: 15 (ref 10–24)
BUN: 23 mg/dL (ref 8–27)
CO2: 24 mmol/L (ref 20–29)
Calcium: 8.5 mg/dL — ABNORMAL LOW (ref 8.6–10.2)
Chloride: 105 mmol/L (ref 96–106)
Creatinine, Ser: 1.54 mg/dL — ABNORMAL HIGH (ref 0.76–1.27)
GFR calc Af Amer: 50 mL/min/{1.73_m2} — ABNORMAL LOW (ref >59)
GFR calc non Af Amer: 43 mL/min/{1.73_m2} — ABNORMAL LOW (ref >59)
Glucose: 154 mg/dL — ABNORMAL HIGH (ref 65–99)
Potassium: 4.2 mmol/L (ref 3.5–5.2)
Sodium: 143 mmol/L (ref 134–144)

## 2017-05-03 ENCOUNTER — Encounter (HOSPITAL_COMMUNITY): Payer: Self-pay | Admitting: Nurse Practitioner

## 2017-05-04 ENCOUNTER — Ambulatory Visit (HOSPITAL_COMMUNITY): Admit: 2017-05-04 | Payer: Medicare Other | Admitting: Gastroenterology

## 2017-05-04 ENCOUNTER — Encounter (HOSPITAL_COMMUNITY): Payer: Self-pay

## 2017-05-04 SURGERY — COLONOSCOPY
Anesthesia: Monitor Anesthesia Care

## 2017-05-07 ENCOUNTER — Encounter (HOSPITAL_COMMUNITY): Payer: Self-pay | Admitting: Nurse Practitioner

## 2017-05-07 ENCOUNTER — Ambulatory Visit (HOSPITAL_COMMUNITY): Payer: Medicare Other

## 2017-05-07 ENCOUNTER — Ambulatory Visit (HOSPITAL_COMMUNITY)
Admission: RE | Admit: 2017-05-07 | Discharge: 2017-05-07 | Disposition: A | Discharge status: Home / Self Care | Payer: Medicare Other | Source: Ambulatory Visit | Attending: Internal Medicine | Admitting: Internal Medicine

## 2017-05-07 DIAGNOSIS — I480 Paroxysmal atrial fibrillation: Secondary | ICD-10-CM | POA: Insufficient documentation

## 2017-05-07 DIAGNOSIS — I712 Thoracic aortic aneurysm, without rupture: Secondary | ICD-10-CM | POA: Diagnosis not present

## 2017-05-07 DIAGNOSIS — I4891 Unspecified atrial fibrillation: Secondary | ICD-10-CM | POA: Diagnosis not present

## 2017-05-07 MED ORDER — IOPAMIDOL (ISOVUE-370) INJECTION 76%
INTRAVENOUS | Status: AC
  Administered 2017-05-07: 100 mL
  Filled 2017-05-07: qty 100

## 2017-05-07 MED ORDER — IOPAMIDOL (ISOVUE-300) INJECTION 61%: 100.0000 mL | Freq: Once | INTRAVENOUS | Status: DC | PRN

## 2017-05-08 ENCOUNTER — Telehealth: Payer: Self-pay | Admitting: Internal Medicine

## 2017-05-08 NOTE — Telephone Encounter (Signed)
New Message  Pt states he has been having test done and would like to speak to a nurse about it. Please call

## 2017-05-08 NOTE — Telephone Encounter (Signed)
I spoke with pt who is asking if he should continue Multaq prior to ablation.  I told pt he should continue this and not take any medications the morning of the procedure. He is asking  how Dr. Rayann Heman would know what is causing the afib problem when doing the procedure and I advised him he would be monitored during the ablation procedure.

## 2017-05-10 ENCOUNTER — Encounter (HOSPITAL_COMMUNITY): Payer: Self-pay | Admitting: Nurse Practitioner

## 2017-05-11 ENCOUNTER — Ambulatory Visit (HOSPITAL_COMMUNITY): Payer: Medicare Other | Admitting: Certified Registered Nurse Anesthetist

## 2017-05-11 ENCOUNTER — Other Ambulatory Visit: Payer: Self-pay

## 2017-05-11 ENCOUNTER — Encounter (HOSPITAL_COMMUNITY): Payer: Self-pay | Admitting: General Practice

## 2017-05-11 ENCOUNTER — Ambulatory Visit (HOSPITAL_COMMUNITY)
Admission: RE | Admit: 2017-05-11 | Discharge: 2017-05-11 | Disposition: A | Discharge status: Home / Self Care | Payer: Medicare Other | Source: Ambulatory Visit | Attending: Internal Medicine | Admitting: Internal Medicine

## 2017-05-11 ENCOUNTER — Encounter (HOSPITAL_COMMUNITY)
Admission: RE | Disposition: A | Discharge status: Home / Self Care | Payer: Self-pay | Source: Ambulatory Visit | Attending: Internal Medicine

## 2017-05-11 DIAGNOSIS — Z885 Allergy status to narcotic agent status: Secondary | ICD-10-CM | POA: Diagnosis not present

## 2017-05-11 DIAGNOSIS — I480 Paroxysmal atrial fibrillation: Secondary | ICD-10-CM | POA: Diagnosis present

## 2017-05-11 DIAGNOSIS — Z9884 Bariatric surgery status: Secondary | ICD-10-CM | POA: Insufficient documentation

## 2017-05-11 DIAGNOSIS — M1990 Unspecified osteoarthritis, unspecified site: Secondary | ICD-10-CM | POA: Insufficient documentation

## 2017-05-11 DIAGNOSIS — Z86711 Personal history of pulmonary embolism: Secondary | ICD-10-CM | POA: Diagnosis not present

## 2017-05-11 DIAGNOSIS — I2510 Atherosclerotic heart disease of native coronary artery without angina pectoris: Secondary | ICD-10-CM | POA: Insufficient documentation

## 2017-05-11 DIAGNOSIS — Z6841 Body Mass Index (BMI) 40.0 and over, adult: Secondary | ICD-10-CM | POA: Diagnosis not present

## 2017-05-11 DIAGNOSIS — I5032 Chronic diastolic (congestive) heart failure: Secondary | ICD-10-CM | POA: Diagnosis not present

## 2017-05-11 DIAGNOSIS — I110 Hypertensive heart disease with heart failure: Secondary | ICD-10-CM | POA: Insufficient documentation

## 2017-05-11 DIAGNOSIS — J4520 Mild intermittent asthma, uncomplicated: Secondary | ICD-10-CM | POA: Insufficient documentation

## 2017-05-11 DIAGNOSIS — Z87891 Personal history of nicotine dependence: Secondary | ICD-10-CM | POA: Insufficient documentation

## 2017-05-11 DIAGNOSIS — E1151 Type 2 diabetes mellitus with diabetic peripheral angiopathy without gangrene: Secondary | ICD-10-CM | POA: Diagnosis not present

## 2017-05-11 DIAGNOSIS — I4891 Unspecified atrial fibrillation: Secondary | ICD-10-CM | POA: Diagnosis not present

## 2017-05-11 DIAGNOSIS — I4581 Long QT syndrome: Secondary | ICD-10-CM | POA: Insufficient documentation

## 2017-05-11 DIAGNOSIS — I712 Thoracic aortic aneurysm, without rupture: Secondary | ICD-10-CM | POA: Diagnosis not present

## 2017-05-11 DIAGNOSIS — I509 Heart failure, unspecified: Secondary | ICD-10-CM | POA: Diagnosis not present

## 2017-05-11 DIAGNOSIS — Z8249 Family history of ischemic heart disease and other diseases of the circulatory system: Secondary | ICD-10-CM | POA: Insufficient documentation

## 2017-05-11 DIAGNOSIS — Z7901 Long term (current) use of anticoagulants: Secondary | ICD-10-CM | POA: Insufficient documentation

## 2017-05-11 DIAGNOSIS — I483 Typical atrial flutter: Secondary | ICD-10-CM | POA: Diagnosis not present

## 2017-05-11 DIAGNOSIS — G4733 Obstructive sleep apnea (adult) (pediatric): Secondary | ICD-10-CM | POA: Diagnosis not present

## 2017-05-11 DIAGNOSIS — Z955 Presence of coronary angioplasty implant and graft: Secondary | ICD-10-CM | POA: Diagnosis not present

## 2017-05-11 DIAGNOSIS — K589 Irritable bowel syndrome without diarrhea: Secondary | ICD-10-CM | POA: Insufficient documentation

## 2017-05-11 DIAGNOSIS — K219 Gastro-esophageal reflux disease without esophagitis: Secondary | ICD-10-CM | POA: Insufficient documentation

## 2017-05-11 HISTORY — DX: Type 2 diabetes mellitus without complications: E11.9

## 2017-05-11 HISTORY — DX: Personal history of other diseases of the digestive system: Z87.19

## 2017-05-11 HISTORY — DX: Dorsalgia, unspecified: M54.9

## 2017-05-11 HISTORY — DX: Other chronic pain: G89.29

## 2017-05-11 HISTORY — DX: Dependence on other enabling machines and devices: Z99.89

## 2017-05-11 HISTORY — DX: Headache: R51

## 2017-05-11 HISTORY — DX: Acute embolism and thrombosis of unspecified deep veins of unspecified lower extremity: I82.409

## 2017-05-11 HISTORY — DX: Personal history of peptic ulcer disease: Z87.11

## 2017-05-11 HISTORY — DX: Headache, unspecified: R51.9

## 2017-05-11 HISTORY — DX: Obstructive sleep apnea (adult) (pediatric): G47.33

## 2017-05-11 HISTORY — PX: ATRIAL FIBRILLATION ABLATION: EP1191

## 2017-05-11 LAB — POCT ACTIVATED CLOTTING TIME
Activated Clotting Time: 335 seconds
Activated Clotting Time: 334 seconds
Activated Clotting Time: 373 seconds
Activated Clotting Time: 208 seconds
Activated Clotting Time: 202 seconds

## 2017-05-11 LAB — GLUCOSE, CAPILLARY
Glucose-Capillary: 130 mg/dL — ABNORMAL HIGH (ref 65–99)
Glucose-Capillary: 102 mg/dL — ABNORMAL HIGH (ref 65–99)
Glucose-Capillary: 110 mg/dL — ABNORMAL HIGH (ref 65–99)

## 2017-05-11 SURGERY — ATRIAL FIBRILLATION ABLATION
Anesthesia: General

## 2017-05-11 MED ORDER — FUROSEMIDE 80 MG PO TABS: 80.0000 mg | ORAL_TABLET | Freq: Two times a day (BID) | ORAL | Status: DC

## 2017-05-11 MED ORDER — ISOPROTERENOL HCL 0.2 MG/ML IJ SOLN
INTRAMUSCULAR | Status: AC
  Filled 2017-05-11: qty 5

## 2017-05-11 MED ORDER — PHENYLEPHRINE HCL 10 MG/ML IJ SOLN
INTRAMUSCULAR | Status: DC | PRN
  Administered 2017-05-11: 20 ug/min via INTRAVENOUS

## 2017-05-11 MED ORDER — BUPIVACAINE HCL (PF) 0.25 % IJ SOLN
INTRAMUSCULAR | Status: DC | PRN
  Administered 2017-05-11: 25 mL

## 2017-05-11 MED ORDER — PHENYLEPHRINE HCL 10 MG/ML IJ SOLN
INTRAMUSCULAR | Status: DC | PRN
  Administered 2017-05-11 (×2): 80 ug via INTRAVENOUS
  Administered 2017-05-11 (×2): 120 ug via INTRAVENOUS

## 2017-05-11 MED ORDER — SODIUM CHLORIDE 0.9% FLUSH: 3.0000 mL | Freq: Two times a day (BID) | INTRAVENOUS | Status: DC

## 2017-05-11 MED ORDER — LOSARTAN POTASSIUM 50 MG PO TABS: 25.0000 mg | ORAL_TABLET | Freq: Every day | ORAL | Status: DC

## 2017-05-11 MED ORDER — TAMSULOSIN HCL 0.4 MG PO CAPS
0.4000 mg | ORAL_CAPSULE | Freq: Every evening | ORAL | Status: DC
  Administered 2017-05-11: 0.4 mg via ORAL
  Filled 2017-05-11: qty 1

## 2017-05-11 MED ORDER — HEPARIN SODIUM (PORCINE) 1000 UNIT/ML IJ SOLN
INTRAMUSCULAR | Status: DC | PRN
  Administered 2017-05-11 (×2): 1000 [IU] via INTRAVENOUS
  Administered 2017-05-11: 12000 [IU] via INTRAVENOUS

## 2017-05-11 MED ORDER — ROCURONIUM BROMIDE 100 MG/10ML IV SOLN
INTRAVENOUS | Status: DC | PRN
  Administered 2017-05-11: 70 mg via INTRAVENOUS

## 2017-05-11 MED ORDER — HEPARIN (PORCINE) IN NACL 1000-0.9 UT/500ML-% IV SOLN
INTRAVENOUS | Status: AC
  Filled 2017-05-11: qty 500

## 2017-05-11 MED ORDER — HYDROCODONE-ACETAMINOPHEN 5-325 MG PO TABS: 1.0000 | ORAL_TABLET | ORAL | Status: DC | PRN

## 2017-05-11 MED ORDER — ONDANSETRON HCL 4 MG/2ML IJ SOLN: 4.0000 mg | Freq: Four times a day (QID) | INTRAMUSCULAR | Status: DC | PRN

## 2017-05-11 MED ORDER — FENTANYL CITRATE (PF) 100 MCG/2ML IJ SOLN
INTRAMUSCULAR | Status: DC | PRN
  Administered 2017-05-11: 100 ug via INTRAVENOUS

## 2017-05-11 MED ORDER — RIVAROXABAN 20 MG PO TABS
20.0000 mg | ORAL_TABLET | Freq: Every day | ORAL | Status: DC
  Administered 2017-05-11: 20 mg via ORAL
  Filled 2017-05-11: qty 1

## 2017-05-11 MED ORDER — SPIRONOLACTONE 25 MG PO TABS: 25.0000 mg | ORAL_TABLET | Freq: Every day | ORAL | Status: DC

## 2017-05-11 MED ORDER — EPHEDRINE SULFATE 50 MG/ML IJ SOLN: INTRAMUSCULAR | Status: DC | PRN

## 2017-05-11 MED ORDER — METOPROLOL TARTRATE 25 MG PO TABS: 25.0000 mg | ORAL_TABLET | Freq: Two times a day (BID) | ORAL | Status: DC

## 2017-05-11 MED ORDER — HEPARIN SODIUM (PORCINE) 1000 UNIT/ML IJ SOLN
INTRAMUSCULAR | Status: AC
  Filled 2017-05-11: qty 1

## 2017-05-11 MED ORDER — ACETAMINOPHEN 325 MG PO TABS
650.0000 mg | ORAL_TABLET | ORAL | Status: DC | PRN
  Administered 2017-05-11: 20:00:00 650 mg via ORAL
  Filled 2017-05-11: qty 2

## 2017-05-11 MED ORDER — ISOPROTERENOL HCL 0.2 MG/ML IJ SOLN
INTRAVENOUS | Status: DC | PRN
  Administered 2017-05-11: 10 ug/min via INTRAVENOUS

## 2017-05-11 MED ORDER — HEPARIN SODIUM (PORCINE) 1000 UNIT/ML IJ SOLN
INTRAMUSCULAR | Status: DC | PRN
  Administered 2017-05-11: 1000 [IU] via INTRAVENOUS
  Administered 2017-05-11: 2000 [IU] via INTRAVENOUS

## 2017-05-11 MED ORDER — IOPAMIDOL (ISOVUE-370) INJECTION 76%
INTRAVENOUS | Status: AC
  Filled 2017-05-11: qty 50

## 2017-05-11 MED ORDER — SODIUM CHLORIDE 0.9 % IV SOLN
INTRAVENOUS | Status: DC
  Administered 2017-05-11 (×2): via INTRAVENOUS

## 2017-05-11 MED ORDER — LIDOCAINE HCL (CARDIAC) PF 100 MG/5ML IV SOSY
PREFILLED_SYRINGE | INTRAVENOUS | Status: DC | PRN
  Administered 2017-05-11: 60 mg via INTRAVENOUS

## 2017-05-11 MED ORDER — MONTELUKAST SODIUM 10 MG PO TABS
10.0000 mg | ORAL_TABLET | Freq: Every day | ORAL | Status: DC
  Filled 2017-05-11: qty 1

## 2017-05-11 MED ORDER — METHOCARBAMOL 500 MG PO TABS: 500.0000 mg | ORAL_TABLET | Freq: Three times a day (TID) | ORAL | Status: DC | PRN

## 2017-05-11 MED ORDER — EPHEDRINE SULFATE 50 MG/ML IJ SOLN
INTRAMUSCULAR | Status: DC | PRN
  Administered 2017-05-11: 10 mg via INTRAVENOUS
  Administered 2017-05-11: 5 mg via INTRAVENOUS
  Administered 2017-05-11 (×2): 10 mg via INTRAVENOUS

## 2017-05-11 MED ORDER — HEPARIN (PORCINE) IN NACL 2-0.9 UNITS/ML
INTRAMUSCULAR | Status: AC | PRN
  Administered 2017-05-11: 500 mL

## 2017-05-11 MED ORDER — TOPIRAMATE 25 MG PO TABS: 50.0000 mg | ORAL_TABLET | Freq: Two times a day (BID) | ORAL | Status: DC

## 2017-05-11 MED ORDER — ONDANSETRON HCL 4 MG/2ML IJ SOLN
INTRAMUSCULAR | Status: DC | PRN
  Administered 2017-05-11: 4 mg via INTRAVENOUS

## 2017-05-11 MED ORDER — SUCCINYLCHOLINE CHLORIDE 20 MG/ML IJ SOLN
INTRAMUSCULAR | Status: DC | PRN
  Administered 2017-05-11: 100 mg via INTRAVENOUS

## 2017-05-11 MED ORDER — SUGAMMADEX SODIUM 200 MG/2ML IV SOLN
INTRAVENOUS | Status: DC | PRN
  Administered 2017-05-11: 200 mg via INTRAVENOUS

## 2017-05-11 MED ORDER — SODIUM CHLORIDE 0.9% FLUSH: 3.0000 mL | INTRAVENOUS | Status: DC | PRN

## 2017-05-11 MED ORDER — POTASSIUM CHLORIDE CRYS ER 20 MEQ PO TBCR: 40.0000 meq | EXTENDED_RELEASE_TABLET | Freq: Two times a day (BID) | ORAL | Status: DC

## 2017-05-11 MED ORDER — PROTAMINE SULFATE 10 MG/ML IV SOLN
INTRAVENOUS | Status: DC | PRN
  Administered 2017-05-11: 20 mg via INTRAVENOUS
  Administered 2017-05-11: 10 mg via INTRAVENOUS

## 2017-05-11 MED ORDER — SODIUM CHLORIDE 0.9 % IV SOLN: 250.0000 mL | INTRAVENOUS | Status: DC | PRN

## 2017-05-11 MED ORDER — BUPIVACAINE HCL (PF) 0.25 % IJ SOLN
INTRAMUSCULAR | Status: AC
  Filled 2017-05-11: qty 30

## 2017-05-11 MED ORDER — PROPOFOL 10 MG/ML IV BOLUS
INTRAVENOUS | Status: DC | PRN
  Administered 2017-05-11: 120 mg via INTRAVENOUS

## 2017-05-11 MED ORDER — OFF THE BEAT BOOK
Freq: Once | Status: DC
  Filled 2017-05-11: qty 1

## 2017-05-11 MED ORDER — TRAMADOL HCL 50 MG PO TABS: 50.0000 mg | ORAL_TABLET | Freq: Two times a day (BID) | ORAL | Status: DC | PRN

## 2017-05-11 MED ORDER — IOPAMIDOL (ISOVUE-370) INJECTION 76%
INTRAVENOUS | Status: DC | PRN
  Administered 2017-05-11: 3 mL via INTRAVENOUS

## 2017-05-11 MED ORDER — DEXAMETHASONE SODIUM PHOSPHATE 10 MG/ML IJ SOLN
INTRAMUSCULAR | Status: DC | PRN
  Administered 2017-05-11: 10 mg via INTRAVENOUS

## 2017-05-11 SURGICAL SUPPLY — 18 items
BLANKET WARM UNDERBOD FULL ACC (MISCELLANEOUS) ×2 IMPLANT
CATH MAPPNG PENTARAY F 2-6-2MM (CATHETERS) ×1 IMPLANT
CATH NAVISTAR SMARTTOUCH DF (ABLATOR) ×2 IMPLANT
CATH SOUNDSTAR 3D IMAGING (CATHETERS) ×2 IMPLANT
CATH WEBSTER BI DIR CS D-F CRV (CATHETERS) ×2 IMPLANT
COVER SWIFTLINK CONNECTOR (BAG) ×2 IMPLANT
NEEDLE BAYLIS TRANSSEPTAL 71CM (NEEDLE) ×2 IMPLANT
NEEDLE TRANSEP BRK 71CM 407200 (NEEDLE) ×2 IMPLANT
PACK EP LATEX FREE (CUSTOM PROCEDURE TRAY) ×1
PACK EP LF (CUSTOM PROCEDURE TRAY) ×1 IMPLANT
PAD DEFIB LIFELINK (PAD) ×2 IMPLANT
PATCH CARTO3 (PAD) ×2 IMPLANT
PENTARAY F 2-6-2MM (CATHETERS) ×2
SHEATH AVANTI 11CM 7FR (SHEATH) ×4 IMPLANT
SHEATH AVANTI 11CM 9FR (SHEATH) ×2 IMPLANT
SHEATH AVANTI 11F 11CM (SHEATH) ×2 IMPLANT
SHEATH SWARTZ TS SL2 63CM 8.5F (SHEATH) ×2 IMPLANT
TUBING SMART ABLATE COOLFLOW (TUBING) ×2 IMPLANT

## 2017-05-11 NOTE — Progress Notes (Addendum)
Pt with order to discharge today. Walked in the hallway approximately 300 ft  without difficulty. Right femoral site remains level "0". V/S stable. Discharged instructions & MD appointments  given. Right femoral site care instructed. IV removed.Discharged @21 :30 via wheelchair. Wife at bedside.

## 2017-05-11 NOTE — Progress Notes (Signed)
Site area: rt groin fv sheaths x3 Site Prior to Removal:  Level 0 Pressure Applied For:  25 minutes Manual:   yes Patient Status During Pull:  stable Post Pull Site:  Level  0 Post Pull Instructions Given:  yes Post Pull Pulses Present: palpable Dressing Applied:  Gauze and tegaderm Bedrest begins @ 1600 Comments:  IV saline locked

## 2017-05-11 NOTE — Anesthesia Preprocedure Evaluation (Addendum)
Anesthesia Evaluation  Patient identified by MRN, date of birth, ID band Patient awake    Reviewed: Allergy & Precautions, H&P , NPO status , Patient's Chart, lab work & pertinent test results, reviewed documented beta blocker date and time   Airway Mallampati: III  TM Distance: >3 FB Neck ROM: Full    Dental no notable dental hx. (+) Teeth Intact, Dental Advisory Given   Pulmonary asthma , sleep apnea and Continuous Positive Airway Pressure Ventilation , former smoker,    Pulmonary exam normal breath sounds clear to auscultation       Cardiovascular hypertension, Pt. on medications and Pt. on home beta blockers + CAD, + Cardiac Stents, + Peripheral Vascular Disease and +CHF   Rhythm:Regular Rate:Normal     Neuro/Psych  Headaches, negative psych ROS   GI/Hepatic Neg liver ROS, GERD  Medicated and Controlled,  Endo/Other  diabetesMorbid obesity  Renal/GU negative Renal ROS  negative genitourinary   Musculoskeletal  (+) Arthritis , Osteoarthritis,    Abdominal   Peds  Hematology negative hematology ROS (+)   Anesthesia Other Findings   Reproductive/Obstetrics negative OB ROS                            Anesthesia Physical Anesthesia Plan  ASA: III  Anesthesia Plan: General   Post-op Pain Management:    Induction: Intravenous  PONV Risk Score and Plan: 3 and Ondansetron, Dexamethasone and Treatment may vary due to age or medical condition  Airway Management Planned: Oral ETT  Additional Equipment:   Intra-op Plan:   Post-operative Plan: Extubation in OR  Informed Consent: I have reviewed the patients History and Physical, chart, labs and discussed the procedure including the risks, benefits and alternatives for the proposed anesthesia with the patient or authorized representative who has indicated his/her understanding and acceptance.   Dental advisory given  Plan Discussed  with: CRNA  Anesthesia Plan Comments:         Anesthesia Quick Evaluation

## 2017-05-11 NOTE — Transfer of Care (Signed)
Immediate Anesthesia Transfer of Care Note  Patient: Carlos Dawson  Procedure(s) Performed: ATRIAL FIBRILLATION ABLATION (N/A )  Patient Location: Cath Lab  Anesthesia Type:General  Level of Consciousness: awake, alert , oriented and patient cooperative  Airway & Oxygen Therapy: Patient Spontanous Breathing and Patient connected to nasal cannula oxygen  Post-op Assessment: Report given to RN and Post -op Vital signs reviewed and stable  Post vital signs: Reviewed and stable  Last Vitals:  Vitals Value Taken Time  BP 127/58 05/11/2017  3:45 PM  Temp    Pulse 70 05/11/2017  3:48 PM  Resp 16 05/11/2017  3:48 PM  SpO2 93 % 05/11/2017  3:48 PM  Vitals shown include unvalidated device data.  Last Pain:  Vitals:   05/11/17 1526  TempSrc: Temporal  PainSc: 2          Complications: No apparent anesthesia complications and note per Juanda Chance, CRNA

## 2017-05-11 NOTE — Progress Notes (Signed)
The patient's nurse stated that he is being discharged and will not need the CPAP tonight.

## 2017-05-11 NOTE — Discharge Summary (Signed)
ELECTROPHYSIOLOGY PROCEDURE DISCHARGE SUMMARY    Patient ID: Carlos Dawson,  MRN: [id number], DOB/AGE: 76-Dec-1942 76 y.o.  Admit date: 05/11/2017 Discharge date: 05/11/2017   Electrophysiologist: [name], MD  Primary Discharge Diagnosis:  1. paroxysmal atrial fibrillation 2. Atrial flutter  Secondary Discharge Diagnosis:  1. Obesity 2. CAD 3. Diabetes 4. Obstructive sleep apnea  Procedures This Admission:  1.  Electrophysiology study and radiofrequency catheter ablation on 05/11/17 by Dr [name].  This study demonstrated successful PVI and CTI ablations.    Brief HPI: Carlos Dawson is a 77 y.o. male with a history of paroxysmal  atrial fibrillation.  They have failed medical therapy with tikosyn. Risks, benefits, and alternatives to catheter ablation of atrial fibrillation were reviewed with the patient who wished to proceed.  The patient underwent Cardiac CT rior to the procedure which demonstrated no LAA thrombus.    Hospital Course:  The patient was admitted and underwent EPS/RFCA of atrial fibrillation with details as outlined above.  They were monitored on telemetry which demonstrated sinus rhythm with PACs.  Groin was without complication on the day of discharge.  The patient was examined and considered to be stable for discharge.  Wound care and restrictions were reviewed with the patient.  The patient will be seen back by Roderic Palau, NP in 4 weeks and Dr Rayann Heman in 12 weeks for post ablation follow up.   This patients CHA2DS2-VASc Score and unadjusted Ischemic Stroke Rate (% per year) is equal to 9.7 % stroke rate/year from a score of 6 Above score calculated as 1 point each if present [CHF, HTN, DM, Vascular=MI/PAD/Aortic Plaque, Age if 65-74, or Male] Above score calculated as 2 points each if present [Age > 75, or Stroke/TIA/TE]   Physical Exam: Vitals:   05/11/17 1550 05/11/17 1555 05/11/17 1645 05/11/17 1700  BP: 124/66 (!) 109/59 (!) 125/56  (!) 116/59  Pulse: 69 68    Resp: 15 18 17 16   Temp:    98.3 F (36.8 C)  TempSrc:    Oral  SpO2: 97% 98%    Weight:      Height:        GEN- The patient is well appearing, alert and oriented x 3 today.   HEENT: normocephalic, atraumatic; sclera clear, conjunctiva pink; hearing intact; oropharynx clear; neck supple  Lungs- Clear to ausculation bilaterally, normal work of breathing.  No wheezes, rales, rhonchi Heart- Regular rate and rhythm, no murmurs, rubs or gallops  GI- soft, non-tender, non-distended, bowel sounds present  Extremities- no clubbing, cyanosis, or edema; DP/PT/radial pulses 2+ bilaterally, groin without hematoma/bruit MS- no significant deformity or atrophy Skin- warm and dry, no rash or lesion Psych- euthymic mood, full affect Neuro- strength and sensation are intact   Labs:   Lab Results  Component Value Date   WBC 6.5 04/30/2017   HGB 10.6 (L) 04/30/2017   HCT 33.4 (L) 04/30/2017   MCV 87 04/30/2017   PLT 128 (L) 04/30/2017   No results for input(s): NA, K, CL, CO2, BUN, CREATININE, CALCIUM, PROT, BILITOT, ALKPHOS, ALT, AST, GLUCOSE in the last 168 hours.  Invalid input(s): LABALBU   Discharge Medications:  Allergies as of 05/11/2017      Reactions   Adhesive [tape] Itching, Rash   Latex Itching, Rash, Other (See Comments)   Bandaids   Morphine Itching      Medication List    STOP taking these medications   dronedarone 400 MG tablet Commonly known as:  MULTAQ     TAKE these medications   acetaminophen 500 MG tablet Commonly known as:  TYLENOL Take 1,000 mg by mouth every 8 (eight) hours as needed for mild pain or headache.   albuterol 108 (90 Base) MCG/ACT inhaler Commonly known as:  PROVENTIL HFA;VENTOLIN HFA Inhale 2 puffs into the lungs every 4 (four) hours as needed for wheezing or shortness of breath.   atorvastatin 10 MG tablet Commonly known as:  LIPITOR Take 5 mg by mouth daily at 6 PM.   augmented betamethasone  dipropionate 0.05 % cream Commonly known as:  DIPROLENE-AF Apply 1 application topically 2 (two) times daily as needed for itching.   budesonide-formoterol 80-4.5 MCG/ACT inhaler Commonly known as:  SYMBICORT Inhale 2 puffs into the lungs 2 (two) times daily as needed (for shortness of breath).   celecoxib 200 MG capsule Commonly known as:  CELEBREX Take 200 mg by mouth 2 (two) times daily as needed for moderate pain.   eucerin cream Apply 1 application topically as needed for dry skin.   ferrous gluconate 324 MG tablet Commonly known as:  FERGON Take 324 mg by mouth every evening.   furosemide 80 MG tablet Commonly known as:  LASIX Take 80 mg by mouth 2 (two) times daily.   KLOR-CON M20 20 MEQ tablet Generic drug:  potassium chloride SA TAKE 2 TABLETS BY MOUTH TWICE A DAY What changed:    how much to take  how to take this  when to take this   losartan 25 MG tablet Commonly known as:  COZAAR TAKE 1 TABLET BY MOUTH EVERY DAY What changed:    how much to take  how to take this  when to take this   methocarbamol 500 MG tablet Commonly known as:  ROBAXIN Take 1 tablet (500 mg total) by mouth every 8 (eight) hours as needed for muscle spasms.   metoprolol tartrate 25 MG tablet Commonly known as:  LOPRESSOR TAKE 1 TABLET BY MOUTH TWICE A DAY.TAKE EXTRA 1/2 TAB IF SBP>110 What changed:    how much to take  how to take this  when to take this  additional instructions   montelukast 10 MG tablet Commonly known as:  SINGULAIR Take 10 mg by mouth at bedtime.   nitroGLYCERIN 0.4 MG SL tablet Commonly known as:  NITROSTAT Place 1 tablet (0.4 mg total) under the tongue every 5 (five) minutes as needed for chest pain.   omeprazole 20 MG capsule Commonly known as:  PRILOSEC Take 20 mg by mouth daily.   psyllium 58.6 % packet Commonly known as:  METAMUCIL Take 1 packet by mouth daily.   rivaroxaban 20 MG Tabs tablet Commonly known as:  XARELTO Take 1  tablet (20 mg total) by mouth daily with supper.   sildenafil 100 MG tablet Commonly known as:  VIAGRA Take 100 mg by mouth as needed for erectile dysfunction.   spironolactone 25 MG tablet Commonly known as:  ALDACTONE Take 25 mg by mouth daily.   SYSTANE OP Place 1 drop into both eyes daily as needed (for dry eyes).   tamsulosin 0.4 MG Caps capsule Commonly known as:  FLOMAX Take 0.4 mg by mouth every evening.   topiramate 25 MG capsule Commonly known as:  TOPAMAX Take 50 mg by mouth 2 (two) times daily.   traMADol 50 MG tablet Commonly known as:  ULTRAM Take 50 mg by mouth every 12 (twelve) hours as needed for moderate pain.   Vitamin D-3 5000 units  Tabs Take 5,000 Units by mouth daily.       Disposition:   Follow-up Information    [facility] Follow up on 06/06/2017.   Specialty:  Cardiology Why:  10:00AM Contact information: [address] [id number] [place] [address] [place] [phone]       [name], MD Follow up on 08/20/2017.   Specialty:  Cardiology Why:  10:00AM Contact information: [place] [address] [phone]           Duration of Discharge Encounter: Greater than 30 minutes including physician time.  Army Fossa MD 05/11/2017 6:14 PM

## 2017-05-11 NOTE — Anesthesia Procedure Notes (Signed)
Procedure Name: Intubation Date/Time: 05/11/2017 11:00 AM Performed by: Shirlyn Goltz, CRNA Pre-anesthesia Checklist: Patient identified, Emergency Drugs available, Suction available and Patient being monitored Patient Re-evaluated:Patient Re-evaluated prior to induction Oxygen Delivery Method: Circle system utilized Preoxygenation: Pre-oxygenation with 100% oxygen Induction Type: IV induction Ventilation: Mask ventilation without difficulty and Oral airway inserted - appropriate to patient size Laryngoscope Size: McGraph and 4 Grade View: Grade II Tube type: Oral Tube size: 7.5 mm Number of attempts: 1 Airway Equipment and Method: Stylet Placement Confirmation: ETT inserted through vocal cords under direct vision,  positive ETCO2 and breath sounds checked- equal and bilateral Secured at: 23 cm Tube secured with: Tape Dental Injury: Teeth and Oropharynx as per pre-operative assessment

## 2017-05-11 NOTE — Anesthesia Postprocedure Evaluation (Signed)
Anesthesia Post Note  Patient: Carlos Dawson  Procedure(s) Performed: ATRIAL FIBRILLATION ABLATION (N/A )     Patient location during evaluation: PACU Anesthesia Type: General Level of consciousness: awake and alert Pain management: pain level controlled Vital Signs Assessment: post-procedure vital signs reviewed and stable Respiratory status: spontaneous breathing, nonlabored ventilation, respiratory function stable and patient connected to nasal cannula oxygen Cardiovascular status: blood pressure returned to baseline and stable Postop Assessment: no apparent nausea or vomiting Anesthetic complications: no    Last Vitals:  Vitals:   05/11/17 1505 05/11/17 1526  BP: 116/69   Pulse: 66   Resp: 15   Temp:  36.5 C  SpO2: 100%     Last Pain:  Vitals:   05/11/17 1526  TempSrc: Temporal  PainSc: 2                  Treyon Wymore,W. EDMOND

## 2017-05-11 NOTE — Discharge Instructions (Signed)
Post procedure care instructions No driving for 4 days. No lifting over 5 lbs for 1 week. No vigorous or sexual activity for 1 week. You may return to work on 05/18/17. Keep procedure site clean & dry. If you notice increased pain, swelling, bleeding or pus, call/return!  You may shower, but no soaking baths/hot tubs/pools for 1 week.    You have an appointment set up with the [facility].  Multiple studies have shown that being followed by a dedicated atrial fibrillation clinic in addition to the standard care you receive from your other physicians improves health. We believe that enrollment in the atrial fibrillation clinic will allow [place] to better care for you.   The phone number to the [facility] is [phone]. The clinic is staffed Monday through Friday from 8:30am to 5pm.  Parking Directions: The clinic is located in the Heart and Vascular Building connected to [facility]. 1)From [address] turn on to [address] and go to the 3rd entrance  (Heart and Vascular entrance) on the right. 2)Look to the right for Heart &Vascular Parking Garage. 3)A code for the entrance is required please call the clinic to receive this.   4)Take the elevators to the 1st floor. Registration is in the room with the glass walls at the end of the hallway.  If you have any trouble parking or locating the clinic, please dont hesitate to call [phone].

## 2017-05-11 NOTE — Interval H&P Note (Signed)
History and Physical Interval Note:  05/11/2017 10:26 AM  Carlos Dawson  has presented today for surgery, with the diagnosis of afib  The various methods of treatment have been discussed with the patient and family. After consideration of risks, benefits and other options for treatment, the patient has consented to  Procedure(s): ATRIAL FIBRILLATION ABLATION (N/A) as a surgical intervention .  The patient's history has been reviewed, patient examined, no change in status, stable for surgery.  I have reviewed the patient's chart and labs.  Questions were answered to the patient's satisfaction.    Labs and cardiac CT reviewed at length with patient and his wife.  [name]

## 2017-05-11 NOTE — Progress Notes (Signed)
Dr. Rayann Heman by to see patient. OK to pull sheaths w/ACT 202. Left shoulder feeling better

## 2017-05-14 ENCOUNTER — Encounter (HOSPITAL_COMMUNITY): Payer: Self-pay | Admitting: Internal Medicine

## 2017-05-30 ENCOUNTER — Other Ambulatory Visit: Payer: Self-pay | Admitting: Cardiology

## 2017-06-04 DIAGNOSIS — N529 Male erectile dysfunction, unspecified: Secondary | ICD-10-CM | POA: Diagnosis not present

## 2017-06-04 DIAGNOSIS — R972 Elevated prostate specific antigen [PSA]: Secondary | ICD-10-CM | POA: Diagnosis not present

## 2017-06-04 DIAGNOSIS — C61 Malignant neoplasm of prostate: Secondary | ICD-10-CM | POA: Diagnosis not present

## 2017-06-04 DIAGNOSIS — N3941 Urge incontinence: Secondary | ICD-10-CM | POA: Diagnosis not present

## 2017-06-06 ENCOUNTER — Encounter (HOSPITAL_COMMUNITY): Payer: Self-pay | Admitting: Nurse Practitioner

## 2017-06-06 ENCOUNTER — Ambulatory Visit (HOSPITAL_COMMUNITY)
Admission: RE | Admit: 2017-06-06 | Discharge: 2017-06-06 | Disposition: A | Discharge status: Home / Self Care | Payer: Medicare Other | Source: Ambulatory Visit | Attending: Nurse Practitioner | Admitting: Nurse Practitioner

## 2017-06-06 VITALS — BP 106/62 | HR 53 | Ht 67.0 in | Wt 271.4 lb

## 2017-06-06 DIAGNOSIS — I509 Heart failure, unspecified: Secondary | ICD-10-CM | POA: Diagnosis not present

## 2017-06-06 DIAGNOSIS — I712 Thoracic aortic aneurysm, without rupture: Secondary | ICD-10-CM | POA: Insufficient documentation

## 2017-06-06 DIAGNOSIS — Z7901 Long term (current) use of anticoagulants: Secondary | ICD-10-CM | POA: Insufficient documentation

## 2017-06-06 DIAGNOSIS — Z825 Family history of asthma and other chronic lower respiratory diseases: Secondary | ICD-10-CM | POA: Insufficient documentation

## 2017-06-06 DIAGNOSIS — Z79899 Other long term (current) drug therapy: Secondary | ICD-10-CM | POA: Insufficient documentation

## 2017-06-06 DIAGNOSIS — E119 Type 2 diabetes mellitus without complications: Secondary | ICD-10-CM | POA: Diagnosis not present

## 2017-06-06 DIAGNOSIS — G8929 Other chronic pain: Secondary | ICD-10-CM | POA: Insufficient documentation

## 2017-06-06 DIAGNOSIS — I480 Paroxysmal atrial fibrillation: Secondary | ICD-10-CM | POA: Diagnosis not present

## 2017-06-06 DIAGNOSIS — I110 Hypertensive heart disease with heart failure: Secondary | ICD-10-CM | POA: Diagnosis not present

## 2017-06-06 DIAGNOSIS — Z955 Presence of coronary angioplasty implant and graft: Secondary | ICD-10-CM | POA: Diagnosis not present

## 2017-06-06 DIAGNOSIS — Z8719 Personal history of other diseases of the digestive system: Secondary | ICD-10-CM | POA: Insufficient documentation

## 2017-06-06 DIAGNOSIS — Z79891 Long term (current) use of opiate analgesic: Secondary | ICD-10-CM | POA: Insufficient documentation

## 2017-06-06 DIAGNOSIS — Z8546 Personal history of malignant neoplasm of prostate: Secondary | ICD-10-CM | POA: Diagnosis not present

## 2017-06-06 DIAGNOSIS — R9431 Abnormal electrocardiogram [ECG] [EKG]: Secondary | ICD-10-CM | POA: Insufficient documentation

## 2017-06-06 DIAGNOSIS — Z86711 Personal history of pulmonary embolism: Secondary | ICD-10-CM | POA: Diagnosis not present

## 2017-06-06 DIAGNOSIS — Z9842 Cataract extraction status, left eye: Secondary | ICD-10-CM | POA: Insufficient documentation

## 2017-06-06 DIAGNOSIS — Z9841 Cataract extraction status, right eye: Secondary | ICD-10-CM | POA: Insufficient documentation

## 2017-06-06 DIAGNOSIS — K219 Gastro-esophageal reflux disease without esophagitis: Secondary | ICD-10-CM | POA: Insufficient documentation

## 2017-06-06 DIAGNOSIS — K589 Irritable bowel syndrome without diarrhea: Secondary | ICD-10-CM | POA: Diagnosis not present

## 2017-06-06 DIAGNOSIS — G4733 Obstructive sleep apnea (adult) (pediatric): Secondary | ICD-10-CM | POA: Diagnosis not present

## 2017-06-06 DIAGNOSIS — R001 Bradycardia, unspecified: Secondary | ICD-10-CM | POA: Insufficient documentation

## 2017-06-06 DIAGNOSIS — Z91040 Latex allergy status: Secondary | ICD-10-CM | POA: Insufficient documentation

## 2017-06-06 DIAGNOSIS — Z833 Family history of diabetes mellitus: Secondary | ICD-10-CM | POA: Insufficient documentation

## 2017-06-06 DIAGNOSIS — M1990 Unspecified osteoarthritis, unspecified site: Secondary | ICD-10-CM | POA: Insufficient documentation

## 2017-06-06 DIAGNOSIS — Z9889 Other specified postprocedural states: Secondary | ICD-10-CM | POA: Insufficient documentation

## 2017-06-06 DIAGNOSIS — M549 Dorsalgia, unspecified: Secondary | ICD-10-CM | POA: Insufficient documentation

## 2017-06-06 DIAGNOSIS — I2510 Atherosclerotic heart disease of native coronary artery without angina pectoris: Secondary | ICD-10-CM | POA: Diagnosis not present

## 2017-06-06 DIAGNOSIS — Z888 Allergy status to other drugs, medicaments and biological substances status: Secondary | ICD-10-CM | POA: Diagnosis not present

## 2017-06-06 DIAGNOSIS — Z8249 Family history of ischemic heart disease and other diseases of the circulatory system: Secondary | ICD-10-CM | POA: Insufficient documentation

## 2017-06-06 DIAGNOSIS — Z885 Allergy status to narcotic agent status: Secondary | ICD-10-CM | POA: Insufficient documentation

## 2017-06-06 DIAGNOSIS — Z87891 Personal history of nicotine dependence: Secondary | ICD-10-CM | POA: Insufficient documentation

## 2017-06-06 NOTE — Progress Notes (Signed)
Primary Care Physician: Josetta Huddle, MD Referring Physician: Dr. Rayann Heman Cardiologist: Dr. Martinique   Carlos Dawson is a 77 y.o. male with a h/o CAD, CHF, thorasic aneurysm, paroxysmal afib s/p ablation, 4/19, with Dr. Rayann Heman.  He had been on Multaq before the ablation but was stopped at time of procedure. He feels that he may have had more afib going recently. Has a apple 4 watch, so discussed sending strips for the next week for review, may have to restart multaq. He has noted a fullness in chest when he awakes but as soon as he gets up, it resolves. He had a cath in February with stable disease. No swallowing or groin issues.  Today, he denies symptoms of palpitations, chest pain, shortness of breath, orthopnea, PND, lower extremity edema, dizziness, presyncope, syncope, or neurologic sequela. The patient is tolerating medications without difficulties and is otherwise without complaint today.   Past Medical History:  Diagnosis Date  . Anticoagulant long-term use   . Aortic root enlargement ([place])   . Ascending aortic aneurysm [facility])    recent scan in October 2012 showing no change; followed by Dr. Servando Snare  . ASCVD (arteriosclerotic cardiovascular disease)    Prior BMS to the 2nd OM in September 2012; with repeat cath in October showing patency  . CAD (coronary artery disease)    a. s/p BMS to 2nd OM in Sept 2012; b. LexiScan Myoview (12/2012):  Inf infarct; bowel and motion artifact make study difficult to interpret; no ischemia; not gated; Low Risk  . CHF (congestive heart failure) ([place])    no recent issues 10/13/14  . Chronic back pain    "all over my back" (05/11/2017)  . Colonic polyp   . Contact lens/glasses fitting   . Diastolic dysfunction   . DVT (deep venous thrombosis) ([place])    ?LLE  . Frequent headaches    "probably weekly" (05/11/2017)  . Generalized headaches    neck stenosis  . GERD (gastroesophageal reflux disease)   . Hearing loss   . Hearing loss    more so on  left  . Hemorrhoids   . History of stomach ulcers   . Hypertension   . IBS (irritable bowel syndrome)   . LVH (left ventricular hypertrophy)   . Mild intermittent asthma   . OA (osteoarthritis)    "all over" (05/11/2017)  . Obesities, morbid ([place])   . OSA (obstructive sleep apnea)    PSG 03/30/97 AHI 21, BPAP 13/9  . OSA on CPAP   . PAF (paroxysmal atrial fibrillation) ([place])    a. on Xarelto b. s/p DCCV in 08/2016; b. Tikosyn failed 04/16/17 with plans for Multaq and possible Afib ablation with Dr. Rayann Heman  . Pneumonia    'several times" (05/11/2017)  . Prostate CA [facility])    Oncologist  DR. Daralene Milch [facility] 09/24/14, undetermined tx   prostate; S/P "radiation and hormone injections"  . Pulmonary embolism ([place]) 2008   "both lungs"  . SOB (shortness of breath)    on excertion  . Thoracic aortic aneurysm ([facility])    Aortic Size Index=     5.0    /Body surface area is 2.43 meters squared. = 2.05  < 2.75 cm/m2      4% risk per year 2.75 to 4.25          8% risk per year > 4.25 cm/m2    20% risk per year   Stable aneurysmal dilation of the ascending aorta with maximum AP diameter  of 4.8 cm. Stable area of narrowing of the proximal most portion of the descending aorta measuring 2 cm., previously identified as an area of coarctation. No evidence of aortic dissection.  Coronary artery disease.  Normal appearance of the lungs.   Electronically Signed   By: Fidela Salisbury M.D.   On: 10/01/2014 08:50    . Type II diabetes mellitus ([place])    metphormin, average 154 dx 2017   Past Surgical History:  Procedure Laterality Date  . ACHILLES TENDON REPAIR Bilateral   . AORTIC ARCH ANGIOGRAPHY N/A 03/13/2017   Procedure: AORTIC ARCH ANGIOGRAPHY;  Surgeon: Martinique, [name], MD;  Location: [place] CV LAB;  Service: Cardiovascular;  Laterality: N/A;  . APPENDECTOMY    . ATRIAL FIBRILLATION ABLATION  05/11/2017  . ATRIAL FIBRILLATION ABLATION N/A 05/11/2017   Procedure: ATRIAL FIBRILLATION ABLATION;   Surgeon: [name], MD;  Location: [place] CV LAB;  Service: Cardiovascular;  Laterality: N/A;  . BACK SURGERY     "I've had 7 back and 1 neck ORs" (05/11/2017)  . CARDIAC CATHETERIZATION  2006  . CARPAL TUNNEL RELEASE Bilateral    LEFT  . CATARACT EXTRACTION W/ INTRAOCULAR LENS  IMPLANT, BILATERAL Bilateral   . CERVICAL SPINE SURGERY  06/02/2010   lower back and neck  . COLONOSCOPY WITH PROPOFOL N/A 12/29/2014   Procedure: COLONOSCOPY WITH PROPOFOL;  Surgeon: Garlan Fair, MD;  Location: [facility] ENDOSCOPY;  Service: Endoscopy;  Laterality: N/A;  . CORONARY ANGIOPLASTY WITH STENT PLACEMENT  October 2012  . CORONARY STENT PLACEMENT  Sept 2012   2nd OM with BMS  . HEMORROIDECTOMY    . LAMINECTOMY  05/30/2012   L 4 L5  . LAMINECTOMY WITH POSTERIOR LATERAL ARTHRODESIS LEVEL 3 N/A 10/18/2016   Procedure: Posterior Lateral Fusion - Lumbar One-Four, segmental instrumentation Lumbar One-Five,  decompression,;  Surgeon: Eustace Moore, MD;  Location: [facility] OR;  Service: Neurosurgery;  Laterality: N/A;  . LAPAROSCOPIC CHOLECYSTECTOMY    . LAPAROSCOPIC GASTRIC BANDING    . LEFT AND RIGHT HEART CATHETERIZATION WITH CORONARY ANGIOGRAM N/A 05/07/2014   Procedure: LEFT AND RIGHT HEART CATHETERIZATION WITH CORONARY ANGIOGRAM;  Surgeon: [name] Martinique, MD;  Location: [facility] CATH LAB;  Service: Cardiovascular;  Laterality: N/A;  . LEFT HEART CATH AND CORONARY ANGIOGRAPHY N/A 03/13/2017   Procedure: LEFT HEART CATH AND CORONARY ANGIOGRAPHY;  Surgeon: Martinique, [name], MD;  Location: [place] CV LAB;  Service: Cardiovascular;  Laterality: N/A;  . LUMBAR LAMINECTOMY/DECOMPRESSION MICRODISCECTOMY N/A 05/04/2016   Procedure: Laminectomy and Foraminotomy - Thoracic twelve-Lumbar one -Posterior Fusion Lumbar one-two;  Surgeon: Eustace Moore, MD;  Location: [place];  Service: Neurosurgery;  Laterality: N/A;  . POSTERIOR LUMBAR FUSION  10/18/2016  . SHOULDER OPEN ROTATOR CUFF REPAIR Bilateral   . TONSILLECTOMY AND  ADENOIDECTOMY    . TRIGGER FINGER RELEASE     LEFT  . UVULOPALATOPHARYNGOPLASTY    . VASECTOMY      Current Outpatient Medications  Medication Sig Dispense Refill  . acetaminophen (TYLENOL) 500 MG tablet Take 1,000 mg by mouth every 8 (eight) hours as needed for mild pain or headache.     . albuterol (PROVENTIL HFA;VENTOLIN HFA) 108 (90 Base) MCG/ACT inhaler Inhale 2 puffs into the lungs every 4 (four) hours as needed for wheezing or shortness of breath.    Marland Kitchen atorvastatin (LIPITOR) 10 MG tablet Take 5 mg by mouth daily at 6 PM.     . augmented betamethasone dipropionate (DIPROLENE-AF) 0.05 % cream Apply  1 application topically 2 (two) times daily as needed for itching.  3  . budesonide-formoterol (SYMBICORT) 80-4.5 MCG/ACT inhaler Inhale 2 puffs into the lungs 2 (two) times daily as needed (for shortness of breath).     . celecoxib (CELEBREX) 200 MG capsule Take 200 mg by mouth 2 (two) times daily as needed for moderate pain.  0  . Cholecalciferol (VITAMIN D-3) 5000 units TABS Take 5,000 Units by mouth daily.    . ferrous gluconate (FERGON) 324 MG tablet Take 324 mg by mouth every evening.   1  . furosemide (LASIX) 80 MG tablet Take 80 mg by mouth 2 (two) times daily.    Marland Kitchen KLOR-CON M20 20 MEQ tablet TAKE 2 TABLETS BY MOUTH TWICE A DAY (Patient taking differently: Take 40 MEQ by mouth twice daily) 360 tablet 1  . losartan (COZAAR) 25 MG tablet TAKE 1 TABLET BY MOUTH EVERY DAY (Patient taking differently: Take 25 mg by mouth once daily in the evening) 90 tablet 1  . methocarbamol (ROBAXIN) 500 MG tablet Take 1 tablet (500 mg total) by mouth every 8 (eight) hours as needed for muscle spasms. 60 tablet 1  . metoprolol tartrate (LOPRESSOR) 25 MG tablet TAKE 1 TABLET BY MOUTH TWICE A DAY.TAKE EXTRA 1/2 TABLET IF SBP>110 60 tablet 9  . montelukast (SINGULAIR) 10 MG tablet Take 10 mg by mouth at bedtime.    . nitroGLYCERIN (NITROSTAT) 0.4 MG SL tablet Place 1 tablet (0.4 mg total) under the tongue  every 5 (five) minutes as needed for chest pain. 25 tablet 5  . omeprazole (PRILOSEC) 20 MG capsule Take 20 mg by mouth daily.    Vladimir Faster Glycol-Propyl Glycol (SYSTANE OP) Place 1 drop into both eyes daily as needed (for dry eyes).     . psyllium (METAMUCIL) 58.6 % packet Take 1 packet by mouth daily.    . rivaroxaban (XARELTO) 20 MG TABS tablet Take 1 tablet (20 mg total) by mouth daily with supper. 30 tablet   . sildenafil (VIAGRA) 100 MG tablet Take 100 mg by mouth as needed for erectile dysfunction.     . Skin Protectants, Misc. (EUCERIN) cream Apply 1 application topically as needed for dry skin.    Marland Kitchen spironolactone (ALDACTONE) 25 MG tablet Take 25 mg by mouth daily.    . tamsulosin (FLOMAX) 0.4 MG CAPS Take 0.4 mg by mouth every evening.     . topiramate (TOPAMAX) 25 MG capsule Take 50 mg by mouth 2 (two) times daily.     . traMADol (ULTRAM) 50 MG tablet Take 50 mg by mouth every 12 (twelve) hours as needed for moderate pain.      No current facility-administered medications for this encounter.     Allergies  Allergen Reactions  . Adhesive [Tape] Itching and Rash  . Latex Itching, Rash and Other (See Comments)    Bandaids  . Morphine Itching    Social History   Socioeconomic History  . Marital status: Married    Spouse name: Not on file  . Number of children: 3  . Years of education: Not on file  . Highest education level: Not on file  Occupational History  . Occupation: Retired from Scientist, clinical (histocompatibility and immunogenetics): RETIRED  Social Needs  . Financial resource strain: Not on file  . Food insecurity:    Worry: Not on file    Inability: Not on file  . Transportation needs:    Medical: Not on file    Non-medical: Not  on file  Tobacco Use  . Smoking status: Former Smoker    Packs/day: 1.50    Years: 30.00    Pack years: 45.00    Last attempt to quit: 01/24/1992    Years since quitting: 25.3  . Smokeless tobacco: Never Used  Substance and Sexual Activity  . Alcohol use: No  .  Drug use: No  . Sexual activity: Not Currently  Lifestyle  . Physical activity:    Days per week: Not on file    Minutes per session: Not on file  . Stress: Not on file  Relationships  . Social connections:    Talks on phone: Not on file    Gets together: Not on file    Attends religious service: Not on file    Active member of club or organization: Not on file    Attends meetings of clubs or organizations: Not on file    Relationship status: Not on file  . Intimate partner violence:    Fear of current or ex partner: Not on file    Emotionally abused: Not on file    Physically abused: Not on file    Forced sexual activity: Not on file  Other Topics Concern  . Not on file  Social History Narrative  . Not on file    Family History  Problem Relation Age of Onset  . Heart disease Mother   . Diabetes Mother   . Other Mother        stent placement  . Emphysema Father 75  . Heart attack Sister     ROS- All systems are reviewed and negative except as per the HPI above  Physical Exam: Vitals:   06/06/17 1004  BP: 106/62  Pulse: (!) 53  SpO2: 96%  Weight: 271 lb 6.4 oz (123.1 kg)  Height: 5\' 7"  (1.702 m)   Wt Readings from Last 3 Encounters:  06/06/17 271 lb 6.4 oz (123.1 kg)  05/11/17 269 lb (122 kg)  04/23/17 270 lb 9.6 oz (122.7 kg)    Labs: Lab Results  Component Value Date   NA 143 04/30/2017   K 4.2 04/30/2017   CL 105 04/30/2017   CO2 24 04/30/2017   GLUCOSE 154 (H) 04/30/2017   BUN 23 04/30/2017   CREATININE 1.54 (H) 04/30/2017   CALCIUM 8.5 (L) 04/30/2017   MG 2.2 04/18/2017   Lab Results  Component Value Date   INR 1.3 (H) 03/08/2017   Lab Results  Component Value Date   CHOL 118 01/07/2013   HDL 40 01/07/2013   LDLCALC 51 01/07/2013   TRIG 133 01/07/2013     GEN- The patient is well appearing, alert and oriented x 3 today.   Head- normocephalic, atraumatic Eyes-  Sclera clear, conjunctiva pink Ears- hearing intact Oropharynx-  clear Neck- supple, no JVP Lymph- no cervical lymphadenopathy Lungs- Clear to ausculation bilaterally, normal work of breathing Heart- Regular rate and rhythm, no murmurs, rubs or gallops, PMI not laterally displaced GI- soft, NT, ND, + BS Extremities- no clubbing, cyanosis, or edema MS- no significant deformity or atrophy Skin- no rash or lesion Psych- euthymic mood, full affect Neuro- strength and sensation are intact  EKG- Sinus brady at 53 bpm, pr int 248 ms, qrs int 96 ms, qtc 459 ms Epic records reviewed    Assessment and Plan: 1. Paroxysmal afib  S/p ablation In SR today He feels afib burden has been higher recently He will transmit strips of afib to office next week  from Apple 4 watch  If indicated, will restart Multaq 400 mg bid Continue xarelto 20 mg qd for [id number] score of at least 5, not to interrupt therapy  2. CAD Per Dr. Martinique Recent cath in 02/2017 with stable disease Fullness in chest in am that improves with position change does not sound typical for anginal symptoms, nor is it typical for what I hear associated with ablation Inform office if worsens  F/u with Dr. Rayann Heman July 29 th   Butch Penny C. Kathe Wirick, [facility] [address] [place], [place] [address] [phone]

## 2017-06-08 ENCOUNTER — Other Ambulatory Visit (HOSPITAL_COMMUNITY): Payer: Self-pay | Admitting: *Deleted

## 2017-06-08 ENCOUNTER — Telehealth (HOSPITAL_COMMUNITY): Payer: Self-pay | Admitting: *Deleted

## 2017-06-08 ENCOUNTER — Encounter (INDEPENDENT_AMBULATORY_CARE_PROVIDER_SITE_OTHER): Payer: Self-pay

## 2017-06-08 MED ORDER — DRONEDARONE HCL 400 MG PO TABS: 400.0000 mg | ORAL_TABLET | Freq: Two times a day (BID) | ORAL | 3 refills | Status: DC

## 2017-06-08 NOTE — Telephone Encounter (Signed)
Pt sent in multiple strips of intermittent atrial fibrillation via apple watch montioring. HR between 62-98. Discussed with Roderic Palau NP -- will resume multaq 400mg  BID and check EKG next week.

## 2017-06-11 DIAGNOSIS — M545 Low back pain: Secondary | ICD-10-CM | POA: Diagnosis not present

## 2017-06-11 DIAGNOSIS — Z79899 Other long term (current) drug therapy: Secondary | ICD-10-CM | POA: Diagnosis not present

## 2017-06-11 DIAGNOSIS — M542 Cervicalgia: Secondary | ICD-10-CM | POA: Diagnosis not present

## 2017-06-11 DIAGNOSIS — Z79891 Long term (current) use of opiate analgesic: Secondary | ICD-10-CM | POA: Diagnosis not present

## 2017-06-11 DIAGNOSIS — Z5181 Encounter for therapeutic drug level monitoring: Secondary | ICD-10-CM | POA: Diagnosis not present

## 2017-06-14 ENCOUNTER — Ambulatory Visit (INDEPENDENT_AMBULATORY_CARE_PROVIDER_SITE_OTHER): Payer: Medicare Other | Admitting: Cardiothoracic Surgery

## 2017-06-14 VITALS — BP 112/60 | HR 66 | Resp 20 | Ht 67.0 in | Wt 275.0 lb

## 2017-06-14 DIAGNOSIS — I2510 Atherosclerotic heart disease of native coronary artery without angina pectoris: Secondary | ICD-10-CM

## 2017-06-14 DIAGNOSIS — I712 Thoracic aortic aneurysm, without rupture, unspecified: Secondary | ICD-10-CM

## 2017-06-14 NOTE — Progress Notes (Signed)
[address]       [place] [address]             [phone]                   Ob[facility] Medical Record #[id number] Date of Birth: 11/15/1940  Referring: Martinique, [name], MD Primary Care: Josetta Huddle, MD  Chief Complaint:    Chief Complaint  Patient presents with  . Thoracic Aortic Aneurysm    6 month f/u. CT Cardiac 05/08/17     History of Present Illness:    Patient returns today for followup CT scan 6 months after previous scan for evaluation of dilated ascending  Aorta. He  has known CAD with  a stent was placed in his circumflex coronary artery in the past.   His previous diagnosis of prostate cancer i treatment at [facility].  Patient has significant underlying pulmonary disease.  He has had difficulty ambulating because of chronic back pain.  He is also on anticoagulation for atrial fibrillation.    Since last seen the patient is undergone a cardiac catheterization in February 2019, no significant CAD was noted, no aortic valve insufficiency  1 month ago a cardiac CT was done in preparation for atrial flutter ablation.   Currently the patient primary complaint is significant dyspnea with exertion.  Shortness of breath is his primary complaint, he notes that it continues to get worse.  He becomes dyspneic with very little activity.   In office today appears to be in atrial fibrillation   Current Activity/ Functional Status: Patient is independent with mobility/ambulation, transfers, ADL's, IADL's.   Past Medical History  Diagnosis Date  . Hypertension   . Obesities, morbid   . Pulmonary embolism     2008  . Anticoagulant long-term use   . CAD (coronary artery disease)     a. s/p BMS to 2nd OM in Sept 2012; b. LexiScan Myoview (12/2012):  Inf infarct; bowel and motion artifact make study difficult to interpret; no ischemia; not gated; Low Risk  . OA (osteoarthritis)   . LVH (left ventricular hypertrophy)   .  Colonic polyp   . Mild intermittent asthma   . Hemorrhoids   . Generalized headaches   . SOB (shortness of breath)     using oxygen with exercise  . Diastolic dysfunction   . ASCVD (arteriosclerotic cardiovascular disease)     Prior BMS to the 2nd OM in September 2012; with repeat cath in October showing patency  . GERD (gastroesophageal reflux disease)   . IBS (irritable bowel syndrome)   . Aortic root enlargement   .        Marland Kitchen PAF (paroxysmal atrial fibrillation)     treated with Coumadin  . Hearing loss   . Contact lens/glasses fitting   . OSA (obstructive sleep apnea)     PSG 03/30/97 AHI 21, BPAP 13/9  . Sleep apnea   . Prostate ca   . Thoracic aortic aneurysm     Aortic Size Index=     5.0    /Body surface area is 2.43 meters squared. = 2.05  < 2.75 cm/m2      4% risk per year 2.75 to 4.25          8% risk per year > 4.25 cm/m2    20% risk per year   Stable aneurysmal dilation of the ascending aorta with maximum AP  diameter of 4.8 cm. Stable area of narrowing of the proximal most portion of the descending aorta measuring 2 cm., previously identified as an area of coarctation. No evidence of aortic dissection.  Coronary artery disease.  Normal appearance of the lungs.   Electronically Signed   By: Fidela Salisbury M.D.   On: 10/01/2014 08:50      Past Surgical History:  Procedure Laterality Date  . ACHILLES TENDON REPAIR Bilateral   . AORTIC ARCH ANGIOGRAPHY N/A 03/13/2017   Procedure: AORTIC ARCH ANGIOGRAPHY;  Surgeon: Martinique, [name], MD;  Location: [facility];  Service: Cardiovascular;  Laterality: N/A;  . APPENDECTOMY    . ATRIAL FIBRILLATION ABLATION  05/11/2017  . ATRIAL FIBRILLATION ABLATION N/A 05/11/2017   Procedure: ATRIAL FIBRILLATION ABLATION;  Surgeon: [name], MD;  Location: [facility];  Service: Cardiovascular;  Laterality: N/A;  . BACK SURGERY     "I've had 7 back and 1 neck ORs" (05/11/2017)  . CARDIAC CATHETERIZATION  2006  . CARPAL  TUNNEL RELEASE Bilateral    LEFT  . CATARACT EXTRACTION W/ INTRAOCULAR LENS  IMPLANT, BILATERAL Bilateral   . CERVICAL SPINE SURGERY  06/02/2010   lower back and neck  . COLONOSCOPY WITH PROPOFOL N/A 12/29/2014   Procedure: COLONOSCOPY WITH PROPOFOL;  Surgeon: Garlan Fair, MD;  Location: [facility];  Service: Endoscopy;  Laterality: N/A;  . CORONARY ANGIOPLASTY WITH STENT PLACEMENT  October 2012  . CORONARY STENT PLACEMENT  Sept 2012   2nd OM with BMS  . HEMORROIDECTOMY    . LAMINECTOMY  05/30/2012   L 4 L5  . LAMINECTOMY WITH POSTERIOR LATERAL ARTHRODESIS LEVEL 3 N/A 10/18/2016   Procedure: Posterior Lateral Fusion - Lumbar One-Four, segmental instrumentation Lumbar One-Five,  decompression,;  Surgeon: Eustace Moore, MD;  Location: [facility];  Service: Neurosurgery;  Laterality: N/A;  . LAPAROSCOPIC CHOLECYSTECTOMY    . LAPAROSCOPIC GASTRIC BANDING    . LEFT AND RIGHT HEART CATHETERIZATION WITH CORONARY ANGIOGRAM N/A 05/07/2014   Procedure: LEFT AND RIGHT HEART CATHETERIZATION WITH CORONARY ANGIOGRAM;  Surgeon: [name] Martinique, MD;  Location: [facility];  Service: Cardiovascular;  Laterality: N/A;  . LEFT HEART CATH AND CORONARY ANGIOGRAPHY N/A 03/13/2017   Procedure: LEFT HEART CATH AND CORONARY ANGIOGRAPHY;  Surgeon: Martinique, [name], MD;  Location: [facility];  Service: Cardiovascular;  Laterality: N/A;  . LUMBAR LAMINECTOMY/DECOMPRESSION MICRODISCECTOMY N/A 05/04/2016   Procedure: Laminectomy and Foraminotomy - Thoracic twelve-Lumbar one -Posterior Fusion Lumbar one-two;  Surgeon: Eustace Moore, MD;  Location: [facility];  Service: Neurosurgery;  Laterality: N/A;  . POSTERIOR LUMBAR FUSION  10/18/2016  . SHOULDER OPEN ROTATOR CUFF REPAIR Bilateral   . TONSILLECTOMY AND ADENOIDECTOMY    . TRIGGER FINGER RELEASE     LEFT  . UVULOPALATOPHARYNGOPLASTY    . VASECTOMY      Family History  Problem Relation Age of Onset  . Heart disease Mother   . Diabetes Mother   . Other Mother         stent placement  . Emphysema Father 79  . Heart attack Sister     Social History   Socioeconomic History  . Marital status: Married    Spouse name: Not on file  . Number of children: 3  . Years of education: Not on file  . Highest education level: Not on file  Occupational History  . Occupation: Retired from Scientist, clinical (histocompatibility and immunogenetics): RETIRED  Social Needs  . Financial  resource strain: Not on file  . Food insecurity:    Worry: Not on file    Inability: Not on file  . Transportation needs:    Medical: Not on file    Non-medical: Not on file  Tobacco Use  . Smoking status: Former Smoker    Packs/day: 1.50    Years: 30.00    Pack years: 45.00    Last attempt to quit: 01/24/1992    Years since quitting: 25.4  . Smokeless tobacco: Never Used  Substance and Sexual Activity  . Alcohol use: No  . Drug use: No  . Sexual activity: Not Currently  Lifestyle  . Physical activity:    Days per week: Not on file    Minutes per session: Not on file  . Stress: Not on file  Relationships  . Social connections:    Talks on phone: Not on file    Gets together: Not on file    Attends religious service: Not on file    Active member of club or organization: Not on file    Attends meetings of clubs or organizations: Not on file    Relationship status: Not on file  . Intimate partner violence:    Fear of current or ex partner: Not on file    Emotionally abused: Not on file    Physically abused: Not on file    Forced sexual activity: Not on file  Other Topics Concern  . Not on file  Social History Narrative  . Not on file    Social History   Tobacco Use  Smoking Status Former Smoker  . Packs/day: 1.50  . Years: 30.00  . Pack years: 45.00  . Last attempt to quit: 01/24/1992  . Years since quitting: 25.4  Smokeless Tobacco Never Used    Social History   Substance and Sexual Activity  Alcohol Use No     Allergies  Allergen Reactions  . Adhesive [Tape] Itching and Rash  .  Latex Itching, Rash and Other (See Comments)    Bandaids  . Morphine Itching    Current Outpatient Medications  Medication Sig Dispense Refill  . acetaminophen (TYLENOL) 500 MG tablet Take 1,000 mg by mouth every 8 (eight) hours as needed for mild pain or headache.     . albuterol (PROVENTIL HFA;VENTOLIN HFA) 108 (90 Base) MCG/ACT inhaler Inhale 2 puffs into the lungs every 4 (four) hours as needed for wheezing or shortness of breath.    Marland Kitchen atorvastatin (LIPITOR) 10 MG tablet Take 5 mg by mouth daily at 6 PM.     . augmented betamethasone dipropionate (DIPROLENE-AF) 0.05 % cream Apply 1 application topically 2 (two) times daily as needed for itching.  3  . budesonide-formoterol (SYMBICORT) 80-4.5 MCG/ACT inhaler Inhale 2 puffs into the lungs 2 (two) times daily as needed (for shortness of breath).     . celecoxib (CELEBREX) 200 MG capsule Take 200 mg by mouth 2 (two) times daily as needed for moderate pain.  0  . Cholecalciferol (VITAMIN D-3) 5000 units TABS Take 5,000 Units by mouth daily.    Marland Kitchen dronedarone (MULTAQ) 400 MG tablet Take 1 tablet (400 mg total) by mouth 2 (two) times daily with a meal. 60 tablet 3  . ferrous gluconate (FERGON) 324 MG tablet Take 324 mg by mouth every evening.   1  . furosemide (LASIX) 80 MG tablet Take 80 mg by mouth 2 (two) times daily.    Marland Kitchen KLOR-CON M20 20 MEQ tablet TAKE 2  TABLETS BY MOUTH TWICE A DAY (Patient taking differently: Take 40 MEQ by mouth twice daily) 360 tablet 1  . losartan (COZAAR) 25 MG tablet TAKE 1 TABLET BY MOUTH EVERY DAY (Patient taking differently: Take 25 mg by mouth once daily in the evening) 90 tablet 1  . methocarbamol (ROBAXIN) 500 MG tablet Take 1 tablet (500 mg total) by mouth every 8 (eight) hours as needed for muscle spasms. 60 tablet 1  . metoprolol tartrate (LOPRESSOR) 25 MG tablet TAKE 1 TABLET BY MOUTH TWICE A DAY.TAKE EXTRA 1/2 TABLET IF SBP>110 60 tablet 9  . montelukast (SINGULAIR) 10 MG tablet Take 10 mg by mouth at bedtime.     . nitroGLYCERIN (NITROSTAT) 0.4 MG SL tablet Place 1 tablet (0.4 mg total) under the tongue every 5 (five) minutes as needed for chest pain. 25 tablet 5  . omeprazole (PRILOSEC) 20 MG capsule Take 20 mg by mouth daily.    Carlos Dawson Glycol-Propyl Glycol (SYSTANE OP) Place 1 drop into both eyes daily as needed (for dry eyes).     . psyllium (METAMUCIL) 58.6 % packet Take 1 packet by mouth daily.    . rivaroxaban (XARELTO) 20 MG TABS tablet Take 1 tablet (20 mg total) by mouth daily with supper. 30 tablet   . sildenafil (VIAGRA) 100 MG tablet Take 100 mg by mouth as needed for erectile dysfunction.     . Skin Protectants, Misc. (EUCERIN) cream Apply 1 application topically as needed for dry skin.    Marland Kitchen spironolactone (ALDACTONE) 25 MG tablet Take 25 mg by mouth daily.    . tamsulosin (FLOMAX) 0.4 MG CAPS Take 0.4 mg by mouth every evening.     . topiramate (TOPAMAX) 25 MG capsule Take 50 mg by mouth 2 (two) times daily.     . traMADol (ULTRAM) 50 MG tablet Take 50 mg by mouth every 12 (twelve) hours as needed for moderate pain.      No current facility-administered medications for this visit.        Review of Systems:     Review of Systems:     Cardiac Review of Systems: Y or N  Chest Pain [   y ]  Resting SOB Blue.Reese   ] Exertional SOB  Blue.Reese  ]  Orthopnea Blue.Reese  ]   Pedal Edema [ y  ]    Palpitations [ y ] Syncope  [ n ]   Presyncope [ n  ]  General Review of Systems: [Y] = yes [  ]=no Constitional: recent weight change [  ]; anorexia [  ]; fatigue [  ]; nausea [  ]; night sweats [  ]; fever [  ]; or chills [  ];  Dental: poor dentition[  ];   Eye : blurred vision [  ]; diplopia [   ]; vision changes [  ];  Amaurosis fugax[  ]; Resp: cough [  ];  wheezing[  ];  hemoptysis[  ]; shortness of breath[  ]; paroxysmal nocturnal dyspnea[y  ]; dyspnea on exertion[y  ];  or orthopnea[  ];  GI:  gallstones[  ], vomiting[  ];  dysphagia[  ]; melena[  ];  hematochezia [  ]; heartburn[  ];   Hx of  Colonoscopy[  ]; GU: kidney stones [  ]; hematuria[  ];   dysuria [  ];  nocturia[  ];  history of     obstruction [  ];                 Skin: rash, swelling[  ];, hair loss[  ];  peripheral edema[  ];  or itching[  ]; Musculosketetal: myalgias[  ];  joint swelling[  ];  joint erythema[  ];  joint pain[  ];  back pain[  ];  Heme/Lymph: bruising[  ];  bleeding[  ];  anemia[  ];  Neuro: TIA[  ];  headaches[  ];  stroke[  ];  vertigo[  ];  seizures[  ];   paresthesias[  ];  difficulty walking[y  ];  Psych:depression[  ]; anxiety[  ];  Endocrine: diabetes[  ];  thyroid dysfunction[  ];  Immunizations: Flu [  y]; Pneumococcal[y  ];  Other:    Physical Exam: BP 112/60   Pulse 66   Resp 20   Ht 5\' 7"  (1.702 m)   Wt 275 lb (124.7 kg)   SpO2 96% Comment: RA  BMI 43.07 kg/m   General appearance: alert, cooperative, appears older than stated age and no distress Head: Normocephalic, without obvious abnormality, atraumatic Neck: no adenopathy, no carotid bruit, no JVD, supple, symmetrical, trachea midline and thyroid not enlarged, symmetric, no tenderness/mass/nodules Lymph nodes: Cervical, supraclavicular, and axillary nodes normal. Resp: clear to auscultation bilaterally Back: symmetric, no curvature. ROM normal. No CVA tenderness. Cardio: irregularly irregular rhythm GI: soft, non-tender; bowel sounds normal; no masses,  no organomegaly Extremities: extremities normal, atraumatic, no cyanosis or edema and Homans sign is negative, no sign of DVT Neurologic: Grossly normal  Diagnostic Studies & Laboratory data:     Recent Radiology Findings:  ADDENDUM REPORT: 05/08/2017 07:11  EXAM: OVER-READ INTERPRETATION  CT CHEST  The following report is an over-read performed by radiologist Dr. Collene Leyden [facility] on 05/08/2017. This over-read does  not include interpretation of cardiac or coronary anatomy or pathology. The coronary CTA interpretation by the cardiologist is attached.  COMPARISON:  09/15/2016  FINDINGS: Aneurysmal dilatation of the ascending thoracic aorta, 5 cm maximally, stable since prior study. Mild cardiomegaly. No adenopathy in the lower mediastinum or hila. Areas of scarring in the lingula, right middle lobe and right lower lobe. No effusions.  Imaging into the upper abdomen shows no acute findings. Bilateral gynecomastia. No acute bony abnormality.  IMPRESSION: 5 cm ascending thoracic aortic aneurysm. This is stable since August 2018. Recommend semi-annual imaging followup by CTA or MRA and referral to cardiothoracic surgery if not already obtained. This recommendation follows 2010 ACCF/AHA/AATS/ACR/ASA/SCA/SCAI/SIR/STS/SVM Guidelines for the Diagnosis and Management of Patients With Thoracic Aortic Disease. Circulation. 2010; 121: L875-I433   Electronically Signed   By: Rolm Baptise M.D.   On: 05/08/2017 07:11    CLINICAL DATA:  Chest pain x2 days, follow-up known thoracic aortic aneurysm  EXAM: CT ANGIOGRAPHY CHEST WITH CONTRAST  TECHNIQUE: Multidetector CT imaging of the chest was performed using the standard protocol during bolus administration of intravenous contrast. Multiplanar CT image reconstructions and MIPs were obtained to evaluate the vascular anatomy.  CONTRAST:  90 mL Isovue 370 IV  COMPARISON:  10/14/2015  FINDINGS: Cardiovascular: Preferential opacification of the thoracic aorta. No evidence of aortic dissection.  Stable ascending thoracic aortic aneurysm, measuring 5.0 cm (series 5/ image 53). Mild atherosclerotic calcifications.  Although not tailored for evaluation of the pulmonary arteries, there is no evidence of pulmonary embolism to the lobar level.  Heart is top-normal in size.  No pericardial effusion.  Mild coronary atherosclerosis of the  LAD and left circumflex.  Mediastinum/Nodes: No suspicious mediastinal lymphadenopathy.  Visualized thyroid is unremarkable.  Lungs/Pleura: No suspicious pulmonary nodules.  No focal consolidation.  Mild dependent atelectasis in the bilateral lower lobes.  No pleural effusion or pneumothorax.  Upper Abdomen: Visualized upper abdomen is notable for mild hepatic steatosis and cholecystectomy clips.  Musculoskeletal: Degenerative changes of the visualized thoracolumbar spine.  Review of the MIP images confirms the above findings.  IMPRESSION:Stable 5.0 cm ascending thoracic aortic aneurysm. No evidence of aortic dissection.  Recommend semi-annual imaging followup by CTA or MRA and referral to cardiothoracic surgery if not already obtained. This recommendation follows 2010 ACCF/AHA/AATS/ACR/ASA/SCA/SCAI/SIR/STS/SVM Guidelines for the Diagnosis and Management of Patients With Thoracic Aortic Disease. Circulation. 2010; 121: V425-Z563  No evidence of acute cardiopulmonary disease.  Aortic Atherosclerosis (ICD10-I70.0).   Electronically Signed   By: Julian Hy M.D.   On: 09/15/2016 01:33     CLINICAL DATA:  Followup ascending aortic dilatation  EXAM: CT ANGIOGRAPHY CHEST WITH CONTRAST  TECHNIQUE: Multidetector CT imaging of the chest was performed using the standard protocol during bolus administration of intravenous contrast. Multiplanar CT image reconstructions and MIPs were obtained to evaluate the vascular anatomy.  CONTRAST:  75 mL Isovue 370.  Creatinine was obtained on site at [facility] at [address]Results: Creatinine 1.2 mg/dL.  COMPARISON:  09/01/2014  FINDINGS: Cardiovascular: Mild aortic calcifications are noted. Dilatation of the ascending aorta is again identified to 5 cm. Measurements are somewhat limited by patient motion artifact. Sino-tubular junction measures 4.6 cm. The aorta at the level of  sinus Valsalva measures 5.5 cm. These measurements are stable from the prior exam. Normal distal tapering is seen. No dissection is identified. Bovine arch is again identified.  Coronary calcifications are seen similar to that seen on the prior exam. The pulmonary artery as visualized is within normal limits.  Mediastinum/Nodes: Thoracic inlet is within normal limits. No significant hilar or mediastinal adenopathy is identified. No axillary adenopathy is seen. The esophagus as visualize is within normal limits.  Lungs/Pleura: Mild dependent atelectatic changes are noted. No focal infiltrate or focal nodules are seen. No sizable effusion or pneumothorax is noted.  Upper Abdomen: Gastric lap band is seen in satisfactory position. The remainder the upper abdomen is within normal limits.  Musculoskeletal: Degenerative changes of the thoracic spine are noted.  Review of the MIP images confirms the above findings.  IMPRESSION: Dilatation of the ascending aorta to 5 cm. Given some slight variation in the imaging technique, this is felt to be relatively stable from the prior exam. Ascending thoracic aortic aneurysm. Recommend semi-annual imaging followup by CTA or MRA and referral to cardiothoracic surgery if not already obtained. This recommendation follows 2010 ACCF/AHA/AATS/ACR/ASA/SCA/SCAI/SIR/STS/SVM Guidelines for the Diagnosis and Management of Patients With Thoracic Aortic Disease. Circulation. 2010;  121: [id number]  Otherwise stable appearance of the chest when compared with the prior exam.   Electronically Signed   By: Inez Catalina M.D.   On: 10/14/2015 14:11  Ct Angio Chest Aorta W/cm &/or Wo/cm  10/01/2014   CLINICAL DATA:  Follow up thoracic aortic aneurysm. History of stent for blockage. Recently diagnosed prostate cancer.  EXAM: CT ANGIOGRAPHY CHEST WITH CONTRAST  TECHNIQUE: Multidetector CT imaging of the chest was performed using the standard protocol  during bolus administration of intravenous contrast. Multiplanar CT image reconstructions and MIPs were obtained to evaluate the vascular anatomy.  CONTRAST:  75 cc Isovue 370.  COMPARISON:  09/11/2013  FINDINGS: There stable anulus mild dilation of the ascending aorta with maximum AP diameter of 4.8 cm. The diameter of the thoracic aorta at the arch is 4.4 cm. There is also a stable narrowing of the proximal descending aorta, downstream to the takeoff of the left subclavian artery, measuring 2 cm., as previously identified as possible area of coarctation. The remaining of the descending thoracic aorta is torturous but normal in caliber. There is bovine arch anatomic variation.  There is no evidence of pulmonary embolus.  The heart is normal in size. Conventional coronary artery anatomy is seen with atherosclerotic disease involving mostly the left system. There is no evidence of significant focal lung parenchymal consolidation, pleural effusion or pneumothorax. No masses are identified ; no abnormal focal contrast enhancement is seen.  No mediastinal or axillary lymphadenopathy is seen. The visualized portions of the thyroid gland are unremarkable in appearance.  The visualized portions of the liver and spleen are unremarkable.  The visualized portions of the pancreas, stomach, adrenal glands and kidneys are within normal limits. There has been a prior cholecystectomy.  No acute osseous abnormalities are seen. Thoracic spine diffuse idiopathic skeletal hyperostosis is noted.  Review of the MIP images confirms the above findings.  IMPRESSION: Stable aneurysmal dilation of the ascending aorta with maximum AP diameter of 4.8 cm. Stable area of narrowing of the proximal most portion of the descending aorta measuring 2 cm., previously identified as an area of coarctation. No evidence of aortic dissection.  Coronary artery disease.  Normal appearance of the lungs.   Electronically Signed   By: Fidela Salisbury M.D.    On: 10/01/2014 08:50   Dg Chest 2 View  08/21/2013   CLINICAL DATA:  Followup of pneumonia. Productive cough. Congestion. Asthma. Ex-smoker.  EXAM: CHEST  2 VIEW  COMPARISON:  08/12/2013 and baseline radiograph of 05/21/2013  FINDINGS: Moderate thoracic spondylosis. Midline trachea. Moderate cardiomegaly. Pulmonary artery enlargement. Mediastinal contours otherwise within normal limits. No pleural effusion or pneumothorax. Lower lobe predominant interstitial thickening. Clearing of right lower lobe pneumonia.  IMPRESSION: Clearing of right lower lobe airspace disease/pneumonia.  Cardiomegaly with chronic interstitial thickening, likely related to smoking or chronic bronchitis.  Pulmonary artery enlargement suggests pulmonary arterial hypertension.   Electronically Signed   By: Abigail Miyamoto M.D.   On: 08/21/2013 09:47   Ct Angio Chest Aorta W/cm &/or Wo/cm  09/11/2013   CLINICAL DATA:  Thoracic aortic aneurysm.  EXAM: CT ANGIOGRAPHY CHEST WITH CONTRAST  TECHNIQUE: Multidetector CT imaging of the chest was performed using the standard protocol during bolus administration of intravenous contrast. Multiplanar CT image reconstructions and MIPs were obtained to evaluate the vascular anatomy.  CONTRAST:  71mL OMNIPAQUE IOHEXOL 350 MG/ML SOLN  COMPARISON:  01/06/2013 and 09/14/2011  FINDINGS: Lungs are adequately inflated without consolidation effusion. There is minimal linear scarring over  the anterior right upper lobe unchanged. 4 mm nodular density adjacent the minor fissure unchanged from 2013. There is borderline cardiomegaly. Continued minimal calcification over the left main, lateral circumflex and anterior descending coronary arteries. No change in a right subcarinal lymph node measuring 1 cm by short axis.  There is continued evidence of aneurysmal dilatation of the ascending thoracic aorta which measures 4.8 cm in greatest AP diameter proximally which is unchanged. The thoracic aorta measures 4 cm in  diameter at the level of the proximal arch without significant change. There is a relative area of narrowing of the posterior aortic arch 1.9 cm distal to the take-off of the left subclavian artery as the aorta measures 2.1 cm in diameter focally and increases in diameter to 3.2 cm immediately distal to this narrowing. Main pulmonary artery measures 4 cm in transverse diameter unchanged. Remaining mediastinal structures are unremarkable.  Images through the upper abdomen demonstrate evidence of prior cholecystectomy as well as surgical change over the region of the gastroesophageal junction with lap band apparatus in place. There are degenerative changes of the spine.  Review of the MIP images confirms the above findings.  IMPRESSION: Stable aneurysmal dilatation of the ascending thoracic aorta measuring 4.8 cm in AP diameter. Note that there is a focal relative area of narrowing of the aortic distal to the take-off of the left subclavian artery measuring 2.1 cm in diameter likely representing a degree of coarctation unchanged. No evidence of dissection.  Stable borderline cardiomegaly.  Postsurgical changes as described.   Electronically Signed   By: Marin Olp M.D.   On: 09/11/2013 11:07    Recent Lab Findings: Lab Results  Component Value Date   WBC 6.5 04/30/2017   HGB 10.6 (L) 04/30/2017   HCT 33.4 (L) 04/30/2017   PLT 128 (L) 04/30/2017   GLUCOSE 154 (H) 04/30/2017   CHOL 118 01/07/2013   TRIG 133 01/07/2013   HDL 40 01/07/2013   LDLCALC 51 01/07/2013   ALT 19 03/15/2016   AST 25 03/15/2016   NA 143 04/30/2017   K 4.2 04/30/2017   CL 105 04/30/2017   CREATININE 1.54 (H) 04/30/2017   BUN 23 04/30/2017   CO2 24 04/30/2017   TSH 1.29 05/11/2015   INR 1.3 (H) 03/08/2017   HGBA1C 7.4 (H) 10/16/2016   Aortic Size Index=    5.0   /Body surface area is 2.43 meters squared. =2.12 < 2.75 cm/m2      4% risk per year 2.75 to 4.25          8% risk per year > 4.25 cm/m2    20% risk per  year  Cardiac cath February 2019: Procedures   AORTIC ARCH ANGIOGRAPHY  LEFT HEART CATH AND CORONARY ANGIOGRAPHY  Conclusion     Previously placed Ost 2nd Mrg to 2nd Mrg stent (unknown type) is widely patent.  There is no aortic valve regurgitation.   1. No significant obstructive CAD 2. Aneurysmal thoracic aorta.   Plan: continue medical management . Consider pulmonary evaluation for symptoms of dyspnea.    Indications   Chest pain, unspecified type [R07.9 (ICD-10-CM)]  Procedural Details/Technique   Technical Details Indication: 77 yo WM S/p stenting of OM with BMS in 2012. Presents with atypical chest pain and dyspnea. Abnormal Myoview with inferior ischemia  Procedural Details: The left wrist was prepped, draped, and anesthetized with 1% lidocaine. Using the modified Seldinger technique, a 6 French slender sheath was introduced into the left radial artery. 3 mg  of verapamil was administered through the sheath, weight-based unfractionated heparin was administered intravenously. Standard Judkins catheters were used for selective coronary angiography and aortography. Unable to cross the AV due to distortion of the aorta. A LA 1 catheter was used to engage the RCA. A JL6 catheter was used to engage the LCA. Catheter exchanges were performed over an exchange length guidewire. There were no immediate procedural complications. A TR band was used for radial hemostasis at the completion of the procedure. The patient was transferred to the post catheterization recovery area for further monitoring.  Contrast: 97 cc   Estimated blood loss <50 mL.  During this procedure the patient was administered the following to achieve and maintain moderate conscious sedation: Versed 1 mg, Fentanyl 25 mcg, while the patient's heart rate, blood pressure, and oxygen saturation were continuously monitored. The period of conscious sedation was 24 minutes, of which I was present face-to-face 100% of this  time.  Complications   Complications documented before study signed (03/13/2017 11:35 AM EST)    No complications were associated with this study.  Documented by Martinique, [name], MD - 03/13/2017 11:33 AM EST    Coronary Findings   Diagnostic  Dominance: Left  Left Main  Vessel was injected. Vessel is normal in caliber. Vessel is angiographically normal. The vessel originates from a separate ostium.  Left Anterior Descending  Vessel was injected. Vessel is small. Vessel is angiographically normal. There is mild focal disease in the vessel.  Second Septal Branch  Left Circumflex  Vessel was injected. Vessel is large. Vessel is angiographically normal.  Second Obtuse Marginal Branch  Ost 2nd Mrg to 2nd Mrg lesion 0% stenosed  Previously placed Ost 2nd Mrg to 2nd Mrg stent (unknown type) is widely patent.  Right Coronary Artery  Vessel was injected. Vessel is small. Vessel is angiographically normal.  Intervention   No interventions have been documented.  Left Heart   Aorta Aortic Root: The aortic root is aneurysmal and is enlarged. There is no aortic valve regurgitation.  Coronary Diagrams   Diagnostic Diagram        Echocardiogram February 2019: Echocardiography  Patient:    Carlos, Dawson MR #:       [id number] Study Date: 03/07/2017 Gender:     M Age:        32 Height:     170.2 cm Weight:     122.9 kg BSA:        2.47 m^2 Pt. Status: Room:   SONOGRAPHER  Victorio Palm, RDCS  ATTENDING    Jory Sims M  ORDERING     Jory Sims M  REFERRING    Jory Sims M  PERFORMING   [facility] Outpatient  cc:  ------------------------------------------------------------------- LV EF: 55% -   60%  ------------------------------------------------------------------- Indications:      (I25.10). GLS -21.5  ------------------------------------------------------------------- History:   PMH:  Acquired from the patient and from the patient&'s chart.   Chest pressure.  Dyspnea.  Paroxysmal atrial fibrillation. Coronary artery disease.  Congestive heart failure.  Aortic aneurysm.  Risk factors:  Hypertension. Diabetes mellitus. Morbidly obese. Dyslipidemia.  ------------------------------------------------------------------- Study Conclusions  - Left ventricle: The cavity size was normal. Wall thickness was   increased in a pattern of mild LVH. Systolic function was normal.   The estimated ejection fraction was in the range of 55% to 60%.   Wall motion was normal; there were no regional wall motion   abnormalities. Left ventricular diastolic function parameters   were  normal. - Aortic valve: There was very mild stenosis. Valve area (VTI):   1.81 cm^2. Valve area (Vmax): 1.94 cm^2. Valve area (Vmean): 1.85   cm^2. - Right atrium: The atrium was mildly dilated. - Atrial septum: No defect or patent foramen ovale was identified. - Pulmonary arteries: PA peak pressure: 54 mm Hg (S).  ------------------------------------------------------------------- Labs, prior tests, procedures, and surgery: Echocardiography (April 2016).     EF was 55%.  Catheterization (2012).    The study demonstrated coronary artery disease. There was a stenosis in the 2nd obtuse marginal artery which was treated with a bare-metal stent. Cardioversion.  ------------------------------------------------------------------- Study data:  Comparison was made to the study of 2016.  Study status:  Routine.  Procedure:  The patient reported no pain pre or post test. Transthoracic echocardiography for diagnosis, for left ventricular function evaluation, for right ventricular function evaluation, and for assessment of valvular function. Image quality was adequate.  Study completion:  There were no complications.     Echocardiography.  M-mode, complete 2D, spectral Doppler, and color Doppler.  Birthdate:  Patient birthdate: 1941/01/17.  Age: Patient is 77 yr old.   Sex:  Gender: male.    BMI: 42.4 kg/m^2. Blood pressure:     102/56  Patient status:  Outpatient.  Study date:  Study date: 03/07/2017. Study time: 08:39 AM.  Location: [facility]  -------------------------------------------------------------------  ------------------------------------------------------------------- Left ventricle:  The cavity size was normal. Wall thickness was increased in a pattern of mild LVH. Systolic function was normal. The estimated ejection fraction was in the range of 55% to 60%. Wall motion was normal; there were no regional wall motion abnormalities. The transmitral flow pattern was normal. The deceleration time of the early transmitral flow velocity was normal. The pulmonary vein flow pattern was normal. The tissue Doppler parameters were normal. Left ventricular diastolic function parameters were normal.  ------------------------------------------------------------------- Aortic valve:   Probably trileaflet; mildly thickened, mildly calcified leaflets. Mobility was not restricted.  Doppler:   There was very mild stenosis.   There was no regurgitation.    VTI ratio of LVOT to aortic valve: 0.48. Valve area (VTI): 1.81 cm^2. Indexed valve area (VTI): 0.73 cm^2/m^2. Peak velocity ratio of LVOT to aortic valve: 0.51. Valve area (Vmax): 1.94 cm^2. Indexed valve area (Vmax): 0.78 cm^2/m^2. Mean velocity ratio of LVOT to aortic valve: 0.49. Valve area (Vmean): 1.85 cm^2. Indexed valve area (Vmean): 0.75 cm^2/m^2.    Mean gradient (S): 9 mm Hg. Peak gradient (S): 17 mm Hg.  ------------------------------------------------------------------- Aorta:  The aorta was moderately dilated. Aortic root: The aortic root was normal in size. Ascending aorta: The ascending aorta was mildly dilated.  ------------------------------------------------------------------- Mitral valve:   Mildly thickened leaflets . Mobility was not restricted.  Doppler:   Transvalvular velocity was within the normal range. There was no evidence for stenosis. There was trivial regurgitation.    Peak gradient (D): 3 mm Hg.  ------------------------------------------------------------------- Left atrium:  The atrium was normal in size.  ------------------------------------------------------------------- Atrial septum:  No defect or patent foramen ovale was identified.   ------------------------------------------------------------------- Right ventricle:  The cavity size was normal. Wall thickness was normal. Systolic function was normal.  ------------------------------------------------------------------- Pulmonic valve:    Doppler:  Transvalvular velocity was within the normal range. There was no evidence for stenosis. There was mild regurgitation.  ------------------------------------------------------------------- Tricuspid valve:   Structurally normal valve.    Doppler: Transvalvular velocity was within the normal range. There was mild regurgitation.  ------------------------------------------------------------------- Pulmonary artery:   The  main pulmonary artery was normal-sized. Systolic pressure was within the normal range.  ------------------------------------------------------------------- Right atrium:  The atrium was mildly dilated.  ------------------------------------------------------------------- Pericardium:  The pericardium was normal in appearance. There was no pericardial effusion.  ------------------------------------------------------------------- Systemic veins: Inferior vena cava: The vessel was dilated. The respirophasic diameter changes were blunted (< 50%), consistent with elevated central venous pressure.  ------------------------------------------------------------------- Post procedure conclusions Ascending Aorta:  - The aorta was moderately  dilated.  ------------------------------------------------------------------- Measurements   Left ventricle                            Value          Reference  LV ID, ED, PLAX chordal                   50    mm       43 - 52  LV ID, ES, PLAX chordal                   37    mm       23 - 38  LV fx shortening, PLAX chordal    (L)     26    %        >=29  LV PW thickness, ED                       14    mm       ---------  IVS/LV PW ratio, ED                       0.93           <=1.3  Stroke volume, 2D                         93    ml       ---------  Stroke volume/bsa, 2D                     38    ml/m^2   ---------  LV e&', lateral                            5.44  cm/s     ---------  LV E/e&', lateral                          14.61          ---------  LV e&', medial                             5.87  cm/s     ---------  LV E/e&', medial                           13.54          ---------  LV e&', average                            5.66  cm/s     ---------  LV E/e&', average  14.06          ---------    Ventricular septum                        Value          Reference  IVS thickness, ED                         13    mm       ---------    LVOT                                      Value          Reference  LVOT ID, S                                22    mm       ---------  LVOT area                                 3.8   cm^2     ---------  LVOT ID                                   22    mm       ---------  LVOT peak velocity, S                     105   cm/s     ---------  LVOT mean velocity, S                     66.1  cm/s     ---------  LVOT VTI, S                               24.4  cm       ---------  Stroke volume (SV), LVOT DP               92.8  ml       ---------  Stroke index (SV/bsa), LVOT DP            37.5  ml/m^2   ---------    Aortic valve                              Value          Reference  Aortic valve peak velocity, S             206   cm/s      ---------  Aortic valve mean velocity, S             136   cm/s     ---------  Aortic valve VTI, S                       51.2  cm       ---------  Aortic mean gradient, S  9     mm Hg    ---------  Aortic peak gradient, S                   17    mm Hg    ---------  VTI ratio, LVOT/AV                        0.48           ---------  Aortic valve area, VTI                    1.81  cm^2     ---------  Aortic valve area/bsa, VTI                0.73  cm^2/m^2 ---------  Velocity ratio, peak, LVOT/AV             0.51           ---------  Aortic valve area, peak velocity          1.94  cm^2     ---------  Aortic valve area/bsa, peak               0.78  cm^2/m^2 ---------  velocity  Velocity ratio, mean, LVOT/AV             0.49           ---------  Aortic valve area, mean velocity          1.85  cm^2     ---------  Aortic valve area/bsa, mean               0.75  cm^2/m^2 ---------  velocity    Aorta                                     Value          Reference  Aortic root ID, ED                        42    mm       ---------  Ascending aorta ID, A-P, S                49    mm       ---------    Left atrium                               Value          Reference  LA ID, A-P, ES                            32    mm       ---------  LA ID/bsa, A-P                            1.29  cm/m^2   <=2.2  LA volume, S                              25.9  ml       ---------  LA volume/bsa, S  10.5  ml/m^2   ---------  LA volume, ES, 1-p A4C                    25.9  ml       ---------  LA volume/bsa, ES, 1-p A4C                10.5  ml/m^2   ---------  LA volume, ES, 1-p A2C                    24.9  ml       ---------  LA volume/bsa, ES, 1-p A2C                10.1  ml/m^2   ---------    Mitral valve                              Value          Reference  Mitral E-wave peak velocity               79.5  cm/s     ---------  Mitral A-wave peak velocity                105   cm/s     ---------  Mitral deceleration time                  229   ms       150 - 230  Mitral peak gradient, D                   3     mm Hg    ---------  Mitral E/A ratio, peak                    0.8            ---------    Pulmonary arteries                        Value          Reference  PA pressure, S, DP                (H)     54    mm Hg    <=30    Tricuspid valve                           Value          Reference  Tricuspid regurg peak velocity            338   cm/s     ---------  Tricuspid peak RV-RA gradient             46    mm Hg    ---------    Right atrium                              Value          Reference  RA ID, S-I, ES                            48    mm       ---------  RA ID, M-L, ES, A4C  44    mm       30 - 46    Systemic veins                            Value          Reference  Estimated CVP                             8     mm Hg    ---------    Right ventricle                           Value          Reference  RV ID, minor axis, ED, A4C base           46    mm       ---------  RV ID, minor axis, ED, A4C mid            26    mm       ---------  RV ID, major axis, ED, A4C                57    mm       55 - 91  RV pressure, S, DP                (H)     54    mm Hg    <=30  Legend: (L)  and  (H)  mark values outside specified reference range.  ------------------------------------------------------------------- Prepared and Electronically Authenticated by  Jenkins Rouge, M.D. 2019-02-13T11:50:02  Assessment / Plan:   Patient with no dilated ascending aorta at approximately 5 cm, cardiac CT suggests a bileaflet valve echocardiogram suggest a trileaflet valve No coronary disease of significance noted on cardiac cath February 2019 Patient severely limited by dyspnea on exertion-he is frustrated by no known diagnosis of the cause.  Could consider CPX testing to help sort out the cause of the patient's symptoms-however will defer  this to Dr. Martinique , Dr. Rayann Heman, Dr. Halford Chessman   With the patient's overall poor medical condition I would not recommend elective repair at this point. We'll plan to see the patient back in 6 months r with a followup CTA of the chest   Grace Isaac MD  [place] [facility] [phone] 06/14/2017 1:09 PM

## 2017-06-15 ENCOUNTER — Encounter (HOSPITAL_COMMUNITY): Payer: Self-pay | Admitting: Nurse Practitioner

## 2017-06-15 ENCOUNTER — Ambulatory Visit (HOSPITAL_COMMUNITY)
Admission: RE | Admit: 2017-06-15 | Discharge: 2017-06-15 | Disposition: A | Discharge status: Home / Self Care | Payer: Medicare Other | Source: Ambulatory Visit | Attending: Nurse Practitioner | Admitting: Nurse Practitioner

## 2017-06-15 DIAGNOSIS — Z9889 Other specified postprocedural states: Secondary | ICD-10-CM | POA: Insufficient documentation

## 2017-06-15 DIAGNOSIS — R001 Bradycardia, unspecified: Secondary | ICD-10-CM | POA: Insufficient documentation

## 2017-06-15 DIAGNOSIS — Z79899 Other long term (current) drug therapy: Secondary | ICD-10-CM | POA: Diagnosis not present

## 2017-06-15 DIAGNOSIS — I480 Paroxysmal atrial fibrillation: Secondary | ICD-10-CM | POA: Insufficient documentation

## 2017-06-15 NOTE — Progress Notes (Signed)
Pt in for f/u EKG for restart of Multaq for paroxsymal  afib s/p afib ablation one month ago. S brady  today with qtc of 397 ms.  He has sent strips from his apple watch to confirm. Usually happening about once a day, less than one hour. He did restart  multaq and feels than afib burden is less but still occurring. We did discuss short  term amiodarone as he tried Tikosyn in the past and it was stopped 2/2 long qtc. He wants to continue with the plan for now, will continue to monitor and has f/u with Dr. Rayann Heman in [place] [facility] 6/10, will call and cancel if afib burden is less.He will watch HR and reduce metoprolol to 25 mg 1/2 tab bid if HR is consistently in the 40's.

## 2017-06-19 NOTE — Addendum Note (Signed)
Addended by: Georjean Mode on: 06/19/2017 04:24 PM   Modules accepted: Orders

## 2017-06-26 ENCOUNTER — Encounter (HOSPITAL_COMMUNITY): Payer: Medicare Other

## 2017-07-02 ENCOUNTER — Encounter (HOSPITAL_COMMUNITY): Payer: Self-pay | Admitting: Nurse Practitioner

## 2017-07-02 ENCOUNTER — Ambulatory Visit (HOSPITAL_COMMUNITY)
Admission: RE | Admit: 2017-07-02 | Discharge: 2017-07-02 | Disposition: A | Discharge status: Home / Self Care | Payer: Medicare Other | Source: Ambulatory Visit | Attending: Nurse Practitioner | Admitting: Nurse Practitioner

## 2017-07-02 VITALS — BP 116/64 | HR 61 | Ht 67.0 in | Wt 278.0 lb

## 2017-07-02 DIAGNOSIS — I2510 Atherosclerotic heart disease of native coronary artery without angina pectoris: Secondary | ICD-10-CM | POA: Diagnosis not present

## 2017-07-02 DIAGNOSIS — Z8546 Personal history of malignant neoplasm of prostate: Secondary | ICD-10-CM | POA: Diagnosis not present

## 2017-07-02 DIAGNOSIS — Z79899 Other long term (current) drug therapy: Secondary | ICD-10-CM | POA: Insufficient documentation

## 2017-07-02 DIAGNOSIS — Z6841 Body Mass Index (BMI) 40.0 and over, adult: Secondary | ICD-10-CM | POA: Diagnosis not present

## 2017-07-02 DIAGNOSIS — R0600 Dyspnea, unspecified: Secondary | ICD-10-CM | POA: Diagnosis not present

## 2017-07-02 DIAGNOSIS — R0602 Shortness of breath: Secondary | ICD-10-CM | POA: Insufficient documentation

## 2017-07-02 DIAGNOSIS — I10 Essential (primary) hypertension: Secondary | ICD-10-CM

## 2017-07-02 DIAGNOSIS — Z7901 Long term (current) use of anticoagulants: Secondary | ICD-10-CM | POA: Insufficient documentation

## 2017-07-02 DIAGNOSIS — E119 Type 2 diabetes mellitus without complications: Secondary | ICD-10-CM | POA: Diagnosis not present

## 2017-07-02 DIAGNOSIS — I509 Heart failure, unspecified: Secondary | ICD-10-CM | POA: Insufficient documentation

## 2017-07-02 DIAGNOSIS — I5032 Chronic diastolic (congestive) heart failure: Secondary | ICD-10-CM | POA: Diagnosis not present

## 2017-07-02 DIAGNOSIS — I712 Thoracic aortic aneurysm, without rupture: Secondary | ICD-10-CM | POA: Insufficient documentation

## 2017-07-02 DIAGNOSIS — E669 Obesity, unspecified: Secondary | ICD-10-CM | POA: Insufficient documentation

## 2017-07-02 DIAGNOSIS — G4733 Obstructive sleep apnea (adult) (pediatric): Secondary | ICD-10-CM | POA: Insufficient documentation

## 2017-07-02 DIAGNOSIS — Z9889 Other specified postprocedural states: Secondary | ICD-10-CM | POA: Insufficient documentation

## 2017-07-02 DIAGNOSIS — I110 Hypertensive heart disease with heart failure: Secondary | ICD-10-CM | POA: Diagnosis not present

## 2017-07-02 DIAGNOSIS — I480 Paroxysmal atrial fibrillation: Secondary | ICD-10-CM | POA: Diagnosis not present

## 2017-07-02 DIAGNOSIS — G8929 Other chronic pain: Secondary | ICD-10-CM | POA: Insufficient documentation

## 2017-07-02 DIAGNOSIS — Z86711 Personal history of pulmonary embolism: Secondary | ICD-10-CM | POA: Insufficient documentation

## 2017-07-02 MED ORDER — METOPROLOL TARTRATE 25 MG PO TABS: 12.5000 mg | ORAL_TABLET | Freq: Two times a day (BID) | ORAL | 9 refills | Status: DC

## 2017-07-02 NOTE — Patient Instructions (Signed)
Decrease metoprolol to 1/2 tablet twice a day  

## 2017-07-02 NOTE — Progress Notes (Signed)
PCP: Josetta Huddle, MD Primary Cardiologist: [place] Primary EP: Dr Rubie Maid is a 77 y.o. male who presents today for routine electrophysiology followup.  Since his recent ablation, the patient reports doing reasonably well. He has SOB which is chronic and proceeds his ablation.  He denies procedure related complications.  He is not active.  He is fatigued.   Today, he denies symptoms of palpitations, chest pain, shortness of breath,  lower extremity edema, dizziness, presyncope, or syncope.  The patient is otherwise without complaint today.   Past Medical History:  Diagnosis Date  . Anticoagulant long-term use   . Aortic root enlargement ([place])   . Ascending aortic aneurysm [facility])    recent scan in October 2012 showing no change; followed by Dr. Servando Snare  . ASCVD (arteriosclerotic cardiovascular disease)    Prior BMS to the 2nd OM in September 2012; with repeat cath in October showing patency  . CAD (coronary artery disease)    a. s/p BMS to 2nd OM in Sept 2012; b. LexiScan Myoview (12/2012):  Inf infarct; bowel and motion artifact make study difficult to interpret; no ischemia; not gated; Low Risk  . CHF (congestive heart failure) ([place])    no recent issues 10/13/14  . Chronic back pain    "all over my back" (05/11/2017)  . Colonic polyp   . Contact lens/glasses fitting   . Diastolic dysfunction   . DVT (deep venous thrombosis) ([place])    ?LLE  . Frequent headaches    "probably weekly" (05/11/2017)  . Generalized headaches    neck stenosis  . GERD (gastroesophageal reflux disease)   . Hearing loss   . Hearing loss    more so on left  . Hemorrhoids   . History of stomach ulcers   . Hypertension   . IBS (irritable bowel syndrome)   . LVH (left ventricular hypertrophy)   . Mild intermittent asthma   . OA (osteoarthritis)    "all over" (05/11/2017)  . Obesities, morbid ([place])   . OSA (obstructive sleep apnea)    PSG 03/30/97 AHI 21, BPAP 13/9  . OSA on CPAP   . PAF  (paroxysmal atrial fibrillation) ([place])    a. on Xarelto b. s/p DCCV in 08/2016; b. Tikosyn failed 04/16/17 with plans for Multaq and possible Afib ablation with Dr. Rayann Heman  . Pneumonia    'several times" (05/11/2017)  . Prostate CA [facility])    Oncologist  DR. Daralene Milch [facility] 09/24/14, undetermined tx   prostate; S/P "radiation and hormone injections"  . Pulmonary embolism ([place]) 2008   "both lungs"  . SOB (shortness of breath)    on excertion  . Thoracic aortic aneurysm (HCC)    Aortic Size Index=     5.0    /Body surface area is 2.43 meters squared. = 2.05  < 2.75 cm/m2      4% risk per year 2.75 to 4.25          8% risk per year > 4.25 cm/m2    20% risk per year   Stable aneurysmal dilation of the ascending aorta with maximum AP diameter of 4.8 cm. Stable area of narrowing of the proximal most portion of the descending aorta measuring 2 cm., previously identified as an area of coarctation. No evidence of aortic dissection.  Coronary artery disease.  Normal appearance of the lungs.   Electronically Signed   By: Fidela Salisbury M.D.   On: 10/01/2014 08:50    . Type  II diabetes mellitus ([place])    metphormin, average 154 dx 2017   Past Surgical History:  Procedure Laterality Date  . ACHILLES TENDON REPAIR Bilateral   . AORTIC ARCH ANGIOGRAPHY N/A 03/13/2017   Procedure: AORTIC ARCH ANGIOGRAPHY;  Surgeon: Martinique, [name], MD;  Location: [place] [facility];  Service: Cardiovascular;  Laterality: N/A;  . APPENDECTOMY    . ATRIAL FIBRILLATION ABLATION  05/11/2017  . ATRIAL FIBRILLATION ABLATION N/A 05/11/2017   Procedure: ATRIAL FIBRILLATION ABLATION;  Surgeon: [name], MD;  Location: [place] CV LAB;  Service: Cardiovascular;  Laterality: N/A;  . BACK SURGERY     "I've had 7 back and 1 neck ORs" (05/11/2017)  . CARDIAC CATHETERIZATION  2006  . CARPAL TUNNEL RELEASE Bilateral    LEFT  . CATARACT EXTRACTION W/ INTRAOCULAR LENS  IMPLANT, BILATERAL Bilateral   . CERVICAL SPINE SURGERY   06/02/2010   lower back and neck  . COLONOSCOPY WITH PROPOFOL N/A 12/29/2014   Procedure: COLONOSCOPY WITH PROPOFOL;  Surgeon: Garlan Fair, MD;  Location: [facility];  Service: Endoscopy;  Laterality: N/A;  . CORONARY ANGIOPLASTY WITH STENT PLACEMENT  October 2012  . CORONARY STENT PLACEMENT  Sept 2012   2nd OM with BMS  . HEMORROIDECTOMY    . LAMINECTOMY  05/30/2012   L 4 L5  . LAMINECTOMY WITH POSTERIOR LATERAL ARTHRODESIS LEVEL 3 N/A 10/18/2016   Procedure: Posterior Lateral Fusion - Lumbar One-Four, segmental instrumentation Lumbar One-Five,  decompression,;  Surgeon: Eustace Moore, MD;  Location: [facility] OR;  Service: Neurosurgery;  Laterality: N/A;  . LAPAROSCOPIC CHOLECYSTECTOMY    . LAPAROSCOPIC GASTRIC BANDING    . LEFT AND RIGHT HEART CATHETERIZATION WITH CORONARY ANGIOGRAM N/A 05/07/2014   Procedure: LEFT AND RIGHT HEART CATHETERIZATION WITH CORONARY ANGIOGRAM;  Surgeon: [name] Martinique, MD;  Location: [facility] CATH LAB;  Service: Cardiovascular;  Laterality: N/A;  . LEFT HEART CATH AND CORONARY ANGIOGRAPHY N/A 03/13/2017   Procedure: LEFT HEART CATH AND CORONARY ANGIOGRAPHY;  Surgeon: Martinique, [name], MD;  Location: [place] CV LAB;  Service: Cardiovascular;  Laterality: N/A;  . LUMBAR LAMINECTOMY/DECOMPRESSION MICRODISCECTOMY N/A 05/04/2016   Procedure: Laminectomy and Foraminotomy - Thoracic twelve-Lumbar one -Posterior Fusion Lumbar one-two;  Surgeon: Eustace Moore, MD;  Location: [facility];  Service: Neurosurgery;  Laterality: N/A;  . POSTERIOR LUMBAR FUSION  10/18/2016  . SHOULDER OPEN ROTATOR CUFF REPAIR Bilateral   . TONSILLECTOMY AND ADENOIDECTOMY    . TRIGGER FINGER RELEASE     LEFT  . UVULOPALATOPHARYNGOPLASTY    . VASECTOMY      ROS- all systems are reviewed and negatives except as per HPI above  Current Outpatient Medications  Medication Sig Dispense Refill  . acetaminophen (TYLENOL) 500 MG tablet Take 1,000 mg by mouth every 8 (eight) hours as needed for mild pain or  headache.     . albuterol (PROVENTIL HFA;VENTOLIN HFA) 108 (90 Base) MCG/ACT inhaler Inhale 2 puffs into the lungs every 4 (four) hours as needed for wheezing or shortness of breath.    Marland Kitchen atorvastatin (LIPITOR) 10 MG tablet Take 5 mg by mouth daily at 6 PM.     . augmented betamethasone dipropionate (DIPROLENE-AF) 0.05 % cream Apply 1 application topically 2 (two) times daily as needed for itching.  3  . budesonide-formoterol (SYMBICORT) 80-4.5 MCG/ACT inhaler Inhale 2 puffs into the lungs 2 (two) times daily as needed (for shortness of breath).     . celecoxib (CELEBREX) 200 MG capsule Take 200 mg  by mouth 2 (two) times daily as needed for moderate pain.  0  . Cholecalciferol (VITAMIN D-3) 5000 units TABS Take 5,000 Units by mouth daily.    Marland Kitchen dronedarone (MULTAQ) 400 MG tablet Take 1 tablet (400 mg total) by mouth 2 (two) times daily with a meal. 60 tablet 3  . ferrous gluconate (FERGON) 324 MG tablet Take 324 mg by mouth every evening.   1  . furosemide (LASIX) 80 MG tablet Take 80 mg by mouth 2 (two) times daily.    Marland Kitchen KLOR-CON M20 20 MEQ tablet TAKE 2 TABLETS BY MOUTH TWICE A DAY (Patient taking differently: Take 40 MEQ by mouth twice daily) 360 tablet 1  . losartan (COZAAR) 25 MG tablet TAKE 1 TABLET BY MOUTH EVERY DAY (Patient taking differently: Take 25 mg by mouth once daily in the evening) 90 tablet 1  . methocarbamol (ROBAXIN) 500 MG tablet Take 1 tablet (500 mg total) by mouth every 8 (eight) hours as needed for muscle spasms. 60 tablet 1  . metoprolol tartrate (LOPRESSOR) 25 MG tablet TAKE 1 TABLET BY MOUTH TWICE A DAY.TAKE EXTRA 1/2 TABLET IF SBP>110 60 tablet 9  . montelukast (SINGULAIR) 10 MG tablet Take 10 mg by mouth at bedtime.    . nitroGLYCERIN (NITROSTAT) 0.4 MG SL tablet Place 1 tablet (0.4 mg total) under the tongue every 5 (five) minutes as needed for chest pain. 25 tablet 5  . omeprazole (PRILOSEC) 20 MG capsule Take 20 mg by mouth daily.    Vladimir Faster Glycol-Propyl Glycol  (SYSTANE OP) Place 1 drop into both eyes daily as needed (for dry eyes).     . psyllium (METAMUCIL) 58.6 % packet Take 1 packet by mouth daily.    . rivaroxaban (XARELTO) 20 MG TABS tablet Take 1 tablet (20 mg total) by mouth daily with supper. 30 tablet   . sildenafil (VIAGRA) 100 MG tablet Take 100 mg by mouth as needed for erectile dysfunction.     . Skin Protectants, Misc. (EUCERIN) cream Apply 1 application topically as needed for dry skin.    Marland Kitchen spironolactone (ALDACTONE) 25 MG tablet Take 25 mg by mouth daily.    . tamsulosin (FLOMAX) 0.4 MG CAPS Take 0.4 mg by mouth every evening.     . topiramate (TOPAMAX) 25 MG capsule Take 50 mg by mouth 2 (two) times daily.     . traMADol (ULTRAM) 50 MG tablet Take 50 mg by mouth every 12 (twelve) hours as needed for moderate pain.      No current facility-administered medications for this encounter.     Physical Exam: Vitals:   07/02/17 1359  BP: 116/64  Pulse: 61  Weight: 278 lb (126.1 kg)  Height: 5\' 7"  (1.702 m)    GEN- The patient is overweight appearing, alert and oriented x 3 today.   Head- normocephalic, atraumatic Eyes-  Sclera clear, conjunctiva pink Ears- hearing intact Oropharynx- clear Lungs- Clear to ausculation bilaterally, normal work of breathing Heart- Regular rate and rhythm, no murmurs, rubs or gallops, PMI not laterally displaced GI- soft, NT, ND, + BS Extremities- no clubbing, cyanosis, or edema  Wt Readings from Last 3 Encounters:  07/02/17 278 lb (126.1 kg)  06/15/17 278 lb 6.4 oz (126.3 kg)  06/14/17 275 lb (124.7 kg)    EKG tracing ordered today is personally reviewed and shows sinus 61 bpm, PACs, qtc 434  Assessment and Plan:  1. Paroxysmal atrial fibrillation Continues to improve post ablation In sinus rhythm today Denies  procedure related complications [id number] score is 4.  On xarelto Reduce metoprolol to 12.5mg  BID due to fatigue.  Hopefully we can wean this to off.  2. Chronic diastolic  dysfunciton Stable No change required today  3. HTN Stable No change required today  4. SOB Unclear etiology CPX has been ordered.  I have advised that we wait until he is 3 months post ablation to proceed (August) Hopefully will continue to improve.  He is only 2 months post ablation  Reduce metoprolol Obesity/ deconditioning is certainly a factor  5. OSA Compliant with CPAP Follow-up with Dr Halford Chessman for CPAP optimization  6. Obesity Body mass index is 43.54 kg/m. Wt Readings from Last 3 Encounters:  07/02/17 278 lb (126.1 kg)  06/15/17 278 lb 6.4 oz (126.3 kg)  06/14/17 275 lb (124.7 kg)  I have advised that he participate in regular exercise at the [facility] (he has a membership already)   Follow-up with me as scheduled in July  [name] MD, [facility] 07/02/2017 2:15 PM

## 2017-07-05 ENCOUNTER — Telehealth (HOSPITAL_COMMUNITY): Payer: Self-pay | Admitting: *Deleted

## 2017-07-05 MED ORDER — METOPROLOL TARTRATE 25 MG PO TABS: 25.0000 mg | ORAL_TABLET | Freq: Two times a day (BID) | ORAL | 9 refills | Status: DC

## 2017-07-05 NOTE — Telephone Encounter (Signed)
OK 

## 2017-07-05 NOTE — Telephone Encounter (Signed)
Patient called in stating since decreasing metoprolol he has felt no different but his BP has been elevated and would like to go back on normal dosing of metoprolol. Instructed pt I would let Dr. Rayann Heman know pt felt no improvement of symptoms with reduction and went back to full tab BID for BP control.

## 2017-07-06 ENCOUNTER — Ambulatory Visit: Payer: Medicare Other | Admitting: Pulmonary Disease

## 2017-07-09 ENCOUNTER — Ambulatory Visit (INDEPENDENT_AMBULATORY_CARE_PROVIDER_SITE_OTHER): Payer: Medicare Other | Admitting: Pulmonary Disease

## 2017-07-09 ENCOUNTER — Encounter: Payer: Self-pay | Admitting: Pulmonary Disease

## 2017-07-09 VITALS — BP 126/64 | HR 67 | Ht 67.0 in | Wt 275.6 lb

## 2017-07-09 DIAGNOSIS — R0609 Other forms of dyspnea: Secondary | ICD-10-CM

## 2017-07-09 DIAGNOSIS — G4733 Obstructive sleep apnea (adult) (pediatric): Secondary | ICD-10-CM

## 2017-07-09 DIAGNOSIS — J4530 Mild persistent asthma, uncomplicated: Secondary | ICD-10-CM | POA: Diagnosis not present

## 2017-07-09 DIAGNOSIS — I2510 Atherosclerotic heart disease of native coronary artery without angina pectoris: Secondary | ICD-10-CM | POA: Diagnosis not present

## 2017-07-09 DIAGNOSIS — R0600 Dyspnea, unspecified: Secondary | ICD-10-CM

## 2017-07-09 DIAGNOSIS — Z6841 Body Mass Index (BMI) 40.0 and over, adult: Secondary | ICD-10-CM | POA: Diagnosis not present

## 2017-07-09 NOTE — Patient Instructions (Addendum)
Follow up in 3 months

## 2017-07-09 NOTE — Progress Notes (Signed)
[facility]  Chief Complaint  Patient presents with  . Follow-up    OSA follow up, complaining about dry mouth, even with humidifier    Vital signs: BP 126/64 (BP Location: Left Arm, Cuff Size: Normal)   Pulse 67   Ht 5\' 7"  (1.702 m)   Wt 275 lb 9.6 oz (125 kg)   SpO2 93%   BMI 43.17 kg/m   History of Present Illness: Carlos Dawson is a 77 y.o. male former smoker with OSA, and asthma.  He continues to have trouble with his breathing.  This seems situational.  He gets winded when carrying something in front of him.  If he carries that same thing on his side or over his shoulder, then he does okay.  He also gets short of breath bending over.  He is slowly getting back into an exercise regimen.  He rides a recumbent bike several days per week for about 30 minutes.  He does okay with this.  He has more trouble with his breathing when he is walking.  He isn't having cough, wheeze, sputum, chest pain.  He feels like his asthma is not much of an issue, and stopped using symbicort.  He hasn't noticed a difference with or without symbicort.  He had Echo and [facility] in February that were unremarkable.  He had cardiac ablation in April.  He has maintained sinus rhythm since.  He does well with CPAP.  Only issue relates to dry mouth, but this isn't too bad.    Physical Exam:  General - pleasant Eyes - pupils reactive ENT - no sinus tenderness, no oral exudate, no LAN Cardiac - regular, no murmur Chest - no wheeze, rales Abd - soft, non tender Ext - no edema Skin - no rashes Neuro - normal strength Psych - normal mood   CBC Latest Ref Rng & Units 04/30/2017 04/15/2017 03/08/2017  WBC 3.4 - 10.8 x10E3/uL 6.5 6.4 5.4  Hemoglobin 13.0 - 17.7 g/dL 10.6(L) 12.0(L) 9.3(L)  Hematocrit 37.5 - 51.0 % 33.4(L) 38.9(L) 29.8(L)  Platelets 150 - 379 x10E3/uL 128(L) 178 162     BMP Latest Ref Rng & Units 04/30/2017 04/18/2017 04/17/2017  Glucose 65 - 99 mg/dL  154(H) 161(H) 148(H)  BUN 8 - 27 mg/dL 23 23(H) 21(H)  Creatinine 0.76 - 1.27 mg/dL 1.54(H) 1.15 1.19  BUN/Creat Ratio 10 - 24 15 - -  Sodium 134 - 144 mmol/L 143 137 138  Potassium 3.5 - 5.2 mmol/L 4.2 3.7 3.7  Chloride 96 - 106 mmol/L 105 103 104  CO2 20 - 29 mmol/L 24 23 24   Calcium 8.6 - 10.2 mg/dL 8.5(L) 8.7(L) 8.8(L)    Discussion: He has persistent dyspnea on exertion.  This is likely multifactorial.  He has a fib s/p ablation with diastolic CHF, asthma, obesity with deconditioning, anemia, and elevated PA pressures on Echo from February 2019.   Assessment/Plan:  Dyspnea. - continue monitored exercise program - has cardiopulmonary exercise test scheduled for August 2019 - might need further assessment with cardiology for pulmonary hypertension with right heart cath if f/u Echo after ablation shows persistent elevation in PA pressures - f/u with PCP to monitor anemia  Obstructive sleep apnea. - he is compliant with CPAP and reports benefit - continue CPAP 10 cm H2O - discussed options to improve mask fit  Mild, persistent asthma. - this seems stable at present - continue singulair and prn albuterol - he can resume symbicort if his symptoms  progress  Allergic rhinitis with upper airway cough syndrome. - stable - singulair and prn dymista  A fib s/p ablation, chronic diastolic CHF. - f/u with cardiology  Ascending thoracic aortic aneurysm. - f/u with Dr. Servando Snare   Time spent 29 minutes   Patient Instructions  Follow up in 3 months    Chesley Mires, MD [place] 07/09/2017, 4:31 PM Pager:  [phone]  Flow Sheet  Pulmonary tests: PFT 12/20/10 >> FEV1 2.13(79%), FEV1% 71, TLC 5.80(100%), DLCO 57%, no BD Spirometry 05/11/15 >> FEV1 1.68 (55%), FEV1% 69 FeNO 08/13/15 >> 13 Room air SpO2 with exertion 08/13/15 >> 87% - improved with 2 liters oxygen  Chest imaging: CT chest 09/14/11 >> Stable dilatation of ascending thoracic aorta 4.7  cm, PA 4.1 cm CT angio chest 10/01/14 >> aortic dilation CT angio chest 09/15/16 >> 5 cm ascending TAA  Sleep tests PSG 03/30/97 >> AHI 21 PSG 10/21/13 >> AHI 40.1, SaO2 80%, PLMI 75.6 CPAP 12/28/13 >> CPAP 10 cm H2O >> AHI 1.5, +R  ONO with CPAP 08/19/15 >>test time 8 hrs 40 min. Basal SpO2 92.3%, low SpO2 86%. Spent 1.3 min with SpO2 <88%. CPAP 06/09/17 to 07/08/17 >> used on 30 of 30 nights with average 7 hs 35 min.  Average AHI 3.1 with CPAP 10 cm H2O  Cardiac tests Echo 03/07/17 >> mild LVH, EF 55 to 60%, mild AS, PAS 54 mmHg [facility] 03/13/17 >> patent Ost 2nd Mrg stent  Past Medical History: He  has a past medical history of Anticoagulant long-term use, Aortic root enlargement (HCC), Ascending aortic aneurysm (HCC), ASCVD (arteriosclerotic cardiovascular disease), CAD (coronary artery disease), CHF (congestive heart failure) ([place]), Chronic back pain, Colonic polyp, Contact lens/glasses fitting, Diastolic dysfunction, DVT (deep venous thrombosis) ([place]), Frequent headaches, Generalized headaches, GERD (gastroesophageal reflux disease), Hearing loss, Hearing loss, Hemorrhoids, History of stomach ulcers, Hypertension, IBS (irritable bowel syndrome), LVH (left ventricular hypertrophy), Mild intermittent asthma, OA (osteoarthritis), Obesities, morbid ([place]), OSA (obstructive sleep apnea), OSA on CPAP, PAF (paroxysmal atrial fibrillation) ([place]), Pneumonia, Prostate CA ([place]), Pulmonary embolism ([place]) (2008), SOB (shortness of breath), Thoracic aortic aneurysm ([place]), and Type II diabetes mellitus ([place]).  Past Surgical History: He  has a past surgical history that includes Hemorroidectomy; Vasectomy; Cervical spine surgery (06/02/2010); Achilles tendon repair (Bilateral); Coronary stent placement (Sept 2012); Appendectomy; Laminectomy (05/30/2012); left and right heart catheterization with coronary angiogram (N/A, 05/07/2014); Laparoscopic gastric banding; Uvulopalatopharyngoplasty; Colonoscopy with propofol  (N/A, 12/29/2014); Carpal tunnel release (Bilateral); Trigger finger release; Lumbar laminectomy/decompression microdiscectomy (N/A, 05/04/2016); Posterior lumbar fusion (10/18/2016); Laminectomy with posterior lateral arthrodesis level 3 (N/A, 10/18/2016); LEFT HEART CATH AND CORONARY ANGIOGRAPHY (N/A, 03/13/2017); AORTIC ARCH ANGIOGRAPHY (N/A, 03/13/2017); ATRIAL FIBRILLATION ABLATION (05/11/2017); Laparoscopic cholecystectomy; Shoulder open rotator cuff repair (Bilateral); Back surgery; Cataract extraction w/ intraocular lens  implant, bilateral (Bilateral); Tonsillectomy and adenoidectomy; Cardiac catheterization (2006); Coronary angioplasty with stent (October 2012); and ATRIAL FIBRILLATION ABLATION (N/A, 05/11/2017).  Family History: His family history includes Diabetes in his mother; Emphysema (age of onset: 74) in his father; Heart attack in his sister; Heart disease in his mother; Other in his mother.  Social History: He  reports that he quit smoking about 25 years ago. He has a 45.00 pack-year smoking history. He has never used smokeless tobacco. He reports that he does not drink alcohol or use drugs.  Medications: Allergies as of 07/09/2017      Reactions   Adhesive [tape] Itching, Rash   Latex Itching, Rash, Other (See Comments)   Bandaids  Morphine Itching      Medication List        Accurate as of 07/09/17  4:31 PM. Always use your most recent med list.          acetaminophen 500 MG tablet Commonly known as:  TYLENOL Take 1,000 mg by mouth every 8 (eight) hours as needed for mild pain or headache.   albuterol 108 (90 Base) MCG/ACT inhaler Commonly known as:  PROVENTIL HFA;VENTOLIN HFA Inhale 2 puffs into the lungs every 4 (four) hours as needed for wheezing or shortness of breath.   atorvastatin 10 MG tablet Commonly known as:  LIPITOR Take 5 mg by mouth daily at 6 PM.   augmented betamethasone dipropionate 0.05 % cream Commonly known as:  DIPROLENE-AF Apply 1  application topically 2 (two) times daily as needed for itching.   budesonide-formoterol 80-4.5 MCG/ACT inhaler Commonly known as:  SYMBICORT Inhale 2 puffs into the lungs 2 (two) times daily as needed (for shortness of breath).   celecoxib 200 MG capsule Commonly known as:  CELEBREX Take 200 mg by mouth 2 (two) times daily as needed for moderate pain.   dronedarone 400 MG tablet Commonly known as:  MULTAQ Take 1 tablet (400 mg total) by mouth 2 (two) times daily with a meal.   eucerin cream Apply 1 application topically as needed for dry skin.   ferrous gluconate 324 MG tablet Commonly known as:  FERGON Take 324 mg by mouth every evening.   furosemide 80 MG tablet Commonly known as:  LASIX Take 80 mg by mouth 2 (two) times daily.   KLOR-CON M20 20 MEQ tablet Generic drug:  potassium chloride SA TAKE 2 TABLETS BY MOUTH TWICE A DAY   losartan 25 MG tablet Commonly known as:  COZAAR TAKE 1 TABLET BY MOUTH EVERY DAY   methocarbamol 500 MG tablet Commonly known as:  ROBAXIN Take 1 tablet (500 mg total) by mouth every 8 (eight) hours as needed for muscle spasms.   metoprolol tartrate 25 MG tablet Commonly known as:  LOPRESSOR Take 1 tablet (25 mg total) by mouth 2 (two) times daily.   montelukast 10 MG tablet Commonly known as:  SINGULAIR Take 10 mg by mouth at bedtime.   nitroGLYCERIN 0.4 MG SL tablet Commonly known as:  NITROSTAT Place 1 tablet (0.4 mg total) under the tongue every 5 (five) minutes as needed for chest pain.   omeprazole 20 MG capsule Commonly known as:  PRILOSEC Take 20 mg by mouth daily.   psyllium 58.6 % packet Commonly known as:  METAMUCIL Take 1 packet by mouth daily.   rivaroxaban 20 MG Tabs tablet Commonly known as:  XARELTO Take 1 tablet (20 mg total) by mouth daily with supper.   sildenafil 100 MG tablet Commonly known as:  VIAGRA Take 100 mg by mouth as needed for erectile dysfunction.   spironolactone 25 MG tablet Commonly  known as:  ALDACTONE Take 25 mg by mouth daily.   SYSTANE OP Place 1 drop into both eyes daily as needed (for dry eyes).   tamsulosin 0.4 MG Caps capsule Commonly known as:  FLOMAX Take 0.4 mg by mouth every evening.   topiramate 25 MG capsule Commonly known as:  TOPAMAX Take 50 mg by mouth 2 (two) times daily.   traMADol 50 MG tablet Commonly known as:  ULTRAM Take 50 mg by mouth every 12 (twelve) hours as needed for moderate pain.   Vitamin D-3 5000 units Tabs Take 5,000 Units by mouth  daily.

## 2017-07-16 DIAGNOSIS — I2510 Atherosclerotic heart disease of native coronary artery without angina pectoris: Secondary | ICD-10-CM | POA: Diagnosis not present

## 2017-07-16 DIAGNOSIS — I719 Aortic aneurysm of unspecified site, without rupture: Secondary | ICD-10-CM | POA: Diagnosis not present

## 2017-07-16 DIAGNOSIS — I5030 Unspecified diastolic (congestive) heart failure: Secondary | ICD-10-CM | POA: Diagnosis not present

## 2017-07-16 DIAGNOSIS — I4891 Unspecified atrial fibrillation: Secondary | ICD-10-CM | POA: Diagnosis not present

## 2017-07-16 DIAGNOSIS — E559 Vitamin D deficiency, unspecified: Secondary | ICD-10-CM | POA: Diagnosis not present

## 2017-07-16 DIAGNOSIS — E119 Type 2 diabetes mellitus without complications: Secondary | ICD-10-CM | POA: Diagnosis not present

## 2017-07-16 DIAGNOSIS — E785 Hyperlipidemia, unspecified: Secondary | ICD-10-CM | POA: Diagnosis not present

## 2017-07-16 DIAGNOSIS — Z79899 Other long term (current) drug therapy: Secondary | ICD-10-CM | POA: Diagnosis not present

## 2017-07-16 DIAGNOSIS — I10 Essential (primary) hypertension: Secondary | ICD-10-CM | POA: Diagnosis not present

## 2017-07-16 DIAGNOSIS — N183 Chronic kidney disease, stage 3 (moderate): Secondary | ICD-10-CM | POA: Diagnosis not present

## 2017-07-16 DIAGNOSIS — N400 Enlarged prostate without lower urinary tract symptoms: Secondary | ICD-10-CM | POA: Diagnosis not present

## 2017-07-16 DIAGNOSIS — Z1389 Encounter for screening for other disorder: Secondary | ICD-10-CM | POA: Diagnosis not present

## 2017-07-16 DIAGNOSIS — Z0000 Encounter for general adult medical examination without abnormal findings: Secondary | ICD-10-CM | POA: Diagnosis not present

## 2017-07-16 DIAGNOSIS — D509 Iron deficiency anemia, unspecified: Secondary | ICD-10-CM | POA: Diagnosis not present

## 2017-07-17 ENCOUNTER — Other Ambulatory Visit: Payer: Self-pay | Admitting: Cardiology

## 2017-07-19 NOTE — Addendum Note (Signed)
Encounter addended by: [name], MD on: 07/19/2017 8:55 AM  Actions taken: LOS modified, Charge Capture section accepted

## 2017-07-23 ENCOUNTER — Telehealth (HOSPITAL_COMMUNITY): Payer: Self-pay | Admitting: *Deleted

## 2017-07-23 MED ORDER — LOSARTAN POTASSIUM 25 MG PO TABS: 50.0000 mg | ORAL_TABLET | Freq: Every day | ORAL | 1 refills | Status: DC

## 2017-07-23 MED ORDER — METOPROLOL TARTRATE 25 MG PO TABS: 12.5000 mg | ORAL_TABLET | Freq: Two times a day (BID) | ORAL | 9 refills | Status: DC

## 2017-07-23 NOTE — Telephone Encounter (Signed)
Pt called in stating his HR is ranging between 45-55. Staying in [place]. Discussed with Roderic Palau NP - will decrease metoprolol to 12.5mg  twice a day and increase losartan to 50mg  daily -- will need BMET/BP check in 1 week. Pt verbalized understanding.

## 2017-07-28 ENCOUNTER — Other Ambulatory Visit: Payer: Self-pay | Admitting: Cardiology

## 2017-07-30 NOTE — Telephone Encounter (Signed)
Rx sent to pharmacy   

## 2017-07-31 DIAGNOSIS — L218 Other seborrheic dermatitis: Secondary | ICD-10-CM | POA: Diagnosis not present

## 2017-07-31 DIAGNOSIS — L304 Erythema intertrigo: Secondary | ICD-10-CM | POA: Diagnosis not present

## 2017-08-07 DIAGNOSIS — M48062 Spinal stenosis, lumbar region with neurogenic claudication: Secondary | ICD-10-CM | POA: Diagnosis not present

## 2017-08-07 DIAGNOSIS — M545 Low back pain: Secondary | ICD-10-CM | POA: Diagnosis not present

## 2017-08-13 DIAGNOSIS — M545 Low back pain: Secondary | ICD-10-CM | POA: Diagnosis not present

## 2017-08-17 DIAGNOSIS — C61 Malignant neoplasm of prostate: Secondary | ICD-10-CM | POA: Diagnosis not present

## 2017-08-17 DIAGNOSIS — Z8546 Personal history of malignant neoplasm of prostate: Secondary | ICD-10-CM | POA: Diagnosis not present

## 2017-08-17 DIAGNOSIS — Z87891 Personal history of nicotine dependence: Secondary | ICD-10-CM | POA: Diagnosis not present

## 2017-08-17 DIAGNOSIS — N529 Male erectile dysfunction, unspecified: Secondary | ICD-10-CM | POA: Diagnosis not present

## 2017-08-17 DIAGNOSIS — Z08 Encounter for follow-up examination after completed treatment for malignant neoplasm: Secondary | ICD-10-CM | POA: Diagnosis not present

## 2017-08-20 ENCOUNTER — Ambulatory Visit (INDEPENDENT_AMBULATORY_CARE_PROVIDER_SITE_OTHER): Payer: Medicare Other | Admitting: Internal Medicine

## 2017-08-20 ENCOUNTER — Encounter: Payer: Self-pay | Admitting: Internal Medicine

## 2017-08-20 VITALS — BP 120/62 | HR 54 | Ht 67.0 in | Wt 276.0 lb

## 2017-08-20 DIAGNOSIS — I5032 Chronic diastolic (congestive) heart failure: Secondary | ICD-10-CM

## 2017-08-20 DIAGNOSIS — I10 Essential (primary) hypertension: Secondary | ICD-10-CM

## 2017-08-20 DIAGNOSIS — G4733 Obstructive sleep apnea (adult) (pediatric): Secondary | ICD-10-CM

## 2017-08-20 DIAGNOSIS — I2510 Atherosclerotic heart disease of native coronary artery without angina pectoris: Secondary | ICD-10-CM | POA: Diagnosis not present

## 2017-08-20 DIAGNOSIS — I480 Paroxysmal atrial fibrillation: Secondary | ICD-10-CM | POA: Diagnosis not present

## 2017-08-20 NOTE — Progress Notes (Signed)
PCP: Josetta Huddle, MD Primary Cardiologist: Dr Martinique  Carlos Dawson is a 77 y.o. male who presents today for routine electrophysiology followup.  Since his recent afib ablation, the patient reports doing very well.  he denies procedure related complications and is pleased with the results of the procedure. SOB is improved.  No symptoms of afib. Today, he denies symptoms of palpitations, chest pain, shortness of breath,  lower extremity edema, dizziness, presyncope, or syncope.  The patient is otherwise without complaint today.   Past Medical History:  Diagnosis Date  . Anticoagulant long-term use   . Aortic root enlargement ([place])   . Ascending aortic aneurysm [facility])    recent scan in October 2012 showing no change; followed by Dr. Servando Snare  . ASCVD (arteriosclerotic cardiovascular disease)    Prior BMS to the 2nd OM in September 2012; with repeat cath in October showing patency  . CAD (coronary artery disease)    a. s/p BMS to 2nd OM in Sept 2012; b. LexiScan Myoview (12/2012):  Inf infarct; bowel and motion artifact make study difficult to interpret; no ischemia; not gated; Low Risk  . CHF (congestive heart failure) ([place])    no recent issues 10/13/14  . Chronic back pain    "all over my back" (05/11/2017)  . Colonic polyp   . Contact lens/glasses fitting   . Diastolic dysfunction   . DVT (deep venous thrombosis) ([place])    ?LLE  . Frequent headaches    "probably weekly" (05/11/2017)  . Generalized headaches    neck stenosis  . GERD (gastroesophageal reflux disease)   . Hearing loss   . Hearing loss    more so on left  . Hemorrhoids   . History of stomach ulcers   . Hypertension   . IBS (irritable bowel syndrome)   . LVH (left ventricular hypertrophy)   . Mild intermittent asthma   . OA (osteoarthritis)    "all over" (05/11/2017)  . Obesities, morbid ([place])   . OSA (obstructive sleep apnea)    PSG 03/30/97 AHI 21, BPAP 13/9  . OSA on CPAP   . PAF (paroxysmal atrial  fibrillation) ([place])    a. on Xarelto b. s/p DCCV in 08/2016; b. Tikosyn failed 04/16/17 with plans for Multaq and possible Afib ablation with Dr. Rayann Heman  . Pneumonia    'several times" (05/11/2017)  . Prostate CA [facility])    Oncologist  DR. [facility]ene [facility] 09/24/[facility], undetermined tx   prostate; S/P "radiation and hormone injections"  . Pulmonary embolism ([place]) 2008   "both lungs"  . SOB (shortness of breath)    on excertion  . Thoracic aortic aneurysm (H[facility])    Aortic Size Index=     5.0    /Body surface area is 2.43 meters squared. = 2.05  < 2.75 cm/m2      4% risk per year 2.75 to 4.25          8% risk per year > 4.25 cm/m2    20% risk per year   Stable aneurysmal dilation of the ascending aorta with maximum AP diameter of 4.8 cm. Stable area of narrowing of the proximal most portion of the descending aorta measuring 2 cm., previously identified as an area of coarctation. No evidence of aortic dissection.  Coronary artery disease.  Normal appearance of the lungs.   Electronically Signed   By: Fidela Salisbury M.D.   On: 10/01/2014 08:50    . Type II diabetes mellitus ([place])  metphormin, average 154 dx 2017   Past Surgical History:  Procedure Laterality Date  . ACHILLES TENDON REPAIR Bilateral   . AORTIC ARCH ANGIOGRAPHY N/A 03/13/2017   Procedure: AORTIC ARCH ANGIOGRAPHY;  Surgeon: Martinique, [name], MD;  Location: [place] CV LAB;  Service: Cardiovascular;  Laterality: N/A;  . APPENDECTOMY    . ATRIAL FIBRILLATION ABLATION  05/11/2017  . ATRIAL FIBRILLATION ABLATION N/A 05/11/2017   Procedure: ATRIAL FIBRILLATION ABLATION;  Surgeon: [name], MD;  Location: [place] CV LAB;  Service: Cardiovascular;  Laterality: N/A;  . BACK SURGERY     "I've had 7 back and 1 neck ORs" (05/11/2017)  . CARDIAC CATHETERIZATION  2006  . CARPAL TUNNEL RELEASE Bilateral    LEFT  . CATARACT EXTRACTION W/ INTRAOCULAR LENS  IMPLANT, BILATERAL Bilateral   . CERVICAL SPINE SURGERY  06/02/2010   lower back  and neck  . COLONOSCOPY WITH PROPOFOL N/A 12/29/2014   Procedure: COLONOSCOPY WITH PROPOFOL;  Surgeon: Garlan Fair, MD;  Location: [facility];  Service: Endoscopy;  Laterality: N/A;  . CORONARY ANGIOPLASTY WITH STENT PLACEMENT  October 2012  . CORONARY STENT PLACEMENT  Sept 2012   2nd OM with BMS  . HEMORROIDECTOMY    . LAMINECTOMY  05/30/2012   L 4 L5  . LAMINECTOMY WITH POSTERIOR LATERAL ARTHRODESIS LEVEL 3 N/A 10/18/2016   Procedure: Posterior Lateral Fusion - Lumbar One-Four, segmental instrumentation Lumbar One-Five,  decompression,;  Surgeon: Eustace Moore, MD;  Location: [facility] OR;  Service: Neurosurgery;  Laterality: N/A;  . LAPAROSCOPIC CHOLECYSTECTOMY    . LAPAROSCOPIC GASTRIC BANDING    . LEFT AND RIGHT HEART CATHETERIZATION WITH CORONARY ANGIOGRAM N/A 05/07/2014   Procedure: LEFT AND RIGHT HEART CATHETERIZATION WITH CORONARY ANGIOGRAM;  Surgeon: [name] Martinique, MD;  Location: [facility] CATH LAB;  Service: Cardiovascular;  Laterality: N/A;  . LEFT HEART CATH AND CORONARY ANGIOGRAPHY N/A 03/13/2017   Procedure: LEFT HEART CATH AND CORONARY ANGIOGRAPHY;  Surgeon: Martinique, [name], MD;  Location: [place] CV LAB;  Service: Cardiovascular;  Laterality: N/A;  . LUMBAR LAMINECTOMY/DECOMPRESSION MICRODISCECTOMY N/A 05/04/2016   Procedure: Laminectomy and Foraminotomy - Thoracic twelve-Lumbar one -Posterior Fusion Lumbar one-two;  Surgeon: Eustace Moore, MD;  Location: [place];  Service: Neurosurgery;  Laterality: N/A;  . POSTERIOR LUMBAR FUSION  10/18/2016  . SHOULDER OPEN ROTATOR CUFF REPAIR Bilateral   . TONSILLECTOMY AND ADENOIDECTOMY    . TRIGGER FINGER RELEASE     LEFT  . UVULOPALATOPHARYNGOPLASTY    . VASECTOMY      ROS- all systems are personally reviewed and negatives except as per HPI above  Current Outpatient Medications  Medication Sig Dispense Refill  . acetaminophen (TYLENOL) 500 MG tablet Take 1,000 mg by mouth every 8 (eight) hours as needed for mild pain or headache.     .  albuterol (PROVENTIL HFA;VENTOLIN HFA) 108 (90 Base) MCG/ACT inhaler Inhale 2 puffs into the lungs every 4 (four) hours as needed for wheezing or shortness of breath.    Marland Kitchen atorvastatin (LIPITOR) 10 MG tablet Take 5 mg by mouth daily at 6 PM.     . augmented betamethasone dipropionate (DIPROLENE-AF) 0.05 % cream Apply 1 application topically 2 (two) times daily as needed for itching.  3  . budesonide-formoterol (SYMBICORT) 80-4.5 MCG/ACT inhaler Inhale 2 puffs into the lungs 2 (two) times daily as needed (for shortness of breath).     . celecoxib (CELEBREX) 200 MG capsule Take 200 mg by mouth 2 (two) times daily  as needed for moderate pain.  0  . Cholecalciferol (VITAMIN D-3) 5000 units TABS Take 5,000 Units by mouth daily.    Marland Kitchen dronedarone (MULTAQ) 400 MG tablet Take 1 tablet (400 mg total) by mouth 2 (two) times daily with a meal. 60 tablet 3  . ferrous gluconate (FERGON) 324 MG tablet Take 324 mg by mouth every evening.   1  . furosemide (LASIX) 80 MG tablet Take 80 mg by mouth 2 (two) times daily.    Marland Kitchen KLOR-CON M20 20 MEQ tablet TAKE 2 TABLETS BY MOUTH TWICE A DAY 360 tablet 1  . losartan (COZAAR) 25 MG tablet Take 2 tablets (50 mg total) by mouth daily. 90 tablet 1  . methocarbamol (ROBAXIN) 500 MG tablet Take 1 tablet (500 mg total) by mouth every 8 (eight) hours as needed for muscle spasms. 60 tablet 1  . metoprolol tartrate (LOPRESSOR) 25 MG tablet Take 0.5 tablets (12.5 mg total) by mouth 2 (two) times daily. (Patient taking differently: Take 12.5 mg by mouth 2 (two) times daily. TAke another 1/2 tablet as needed as directed) 60 tablet 9  . montelukast (SINGULAIR) 10 MG tablet Take 10 mg by mouth at bedtime.    . nitroGLYCERIN (NITROSTAT) 0.4 MG SL tablet Place 1 tablet (0.4 mg total) under the tongue every 5 (five) minutes as needed for chest pain. 25 tablet 5  . omeprazole (PRILOSEC) 20 MG capsule Take 20 mg by mouth daily.    Vladimir Faster Glycol-Propyl Glycol (SYSTANE OP) Place 1 drop into  both eyes daily as needed (for dry eyes).     . psyllium (METAMUCIL) 58.6 % packet Take 1 packet by mouth daily.    . rivaroxaban (XARELTO) 20 MG TABS tablet Take 1 tablet (20 mg total) by mouth daily with supper. 30 tablet   . sildenafil (VIAGRA) 100 MG tablet Take 100 mg by mouth as needed for erectile dysfunction.     . Skin Protectants, Misc. (EUCERIN) cream Apply 1 application topically as needed for dry skin.    Marland Kitchen spironolactone (ALDACTONE) 25 MG tablet Take 25 mg by mouth daily.    . tamsulosin (FLOMAX) 0.4 MG CAPS Take 0.4 mg by mouth every evening.     . topiramate (TOPAMAX) 25 MG capsule Take 50 mg by mouth 2 (two) times daily.     . traMADol (ULTRAM) 50 MG tablet Take 50 mg by mouth every 12 (twelve) hours as needed for moderate pain.      No current facility-administered medications for this visit.     Physical Exam: Vitals:   08/20/17 1033  BP: 120/62  Pulse: (!) 54  Weight: 276 lb (125.2 kg)  Height: 5\' 7"  (1.702 m)    GEN- The patient is overweight appearing, alert and oriented x 3 today.   Head- normocephalic, atraumatic Eyes-  Sclera clear, conjunctiva pink Ears- hearing intact Oropharynx- clear Lungs- Clear to ausculation bilaterally, normal work of breathing Heart- Regular rate and rhythm, no murmurs, rubs or gallops, PMI not laterally displaced GI- soft, NT, ND, + BS Extremities- no clubbing, cyanosis, or edema  EKG tracing ordered today is personally reviewed and shows sinus rhythm 54 bpm, PR 258 msec, QRS 106 msec, Qtc 388 msec, nonspecific St/T changes  Assessment and Plan:  1. Persisten atrial fibrillation and atrial flutter Doing well s/p ablation chads2vasc score is 4.  Continue on xarelto Stop multaq In 2 weeks, stop metoprolol Hopefully his heart rate will increase and SOB improve off of these medicines.  I would advise that we wait on CPX until meds are washed out.  2. Chronic diastolic dysfunction Stable No change required today 2 gram  sodium restriction advised Hopefully will improve off of mulaq  3. HTN Stable No change required today  4. OSA Compliant with CPAP  5. Obesity Body mass index is 43.23 kg/m. Lifestyle modification again encouraged Wt Readings from Last 3 Encounters:  08/20/17 276 lb (125.2 kg)  08/16/17 275 lb 8 oz (125 kg)  07/09/17 275 lb 9.6 oz (125 kg)   6. SOB Chronic CPX planned.  I would like for him to be off of metoprolol before he has this done (as above) Dr Halford Chessman following  Return to see Butch Penny in AF clinic in 3 months I will see in 6 months  [name] MD, [facility] 08/20/2017 11:11 AM

## 2017-08-20 NOTE — Patient Instructions (Addendum)
Medication Instructions:  Your physician has recommended you make the following change in your medication:  1.  Stop taking Multaq 2.  Stop taking metoprolol in 2 weeks September 03, 2017  Labwork: None ordered.  Testing/Procedures: None ordered.  Follow-Up: Your physician wants you to follow-up in: 3 months with Roderic Palau NP at the afib clinic.  Your physician wants you to follow-up in: 6 months with Dr. Rayann Heman.      Any Other Special Instructions Will Be Listed Below (If Applicable).  If you need a refill on your cardiac medications before your next appointment, please call your pharmacy.

## 2017-08-20 NOTE — Progress Notes (Signed)
[place] Report   Patient Details  Name: Carlos Dawson MRN: [id number] Date of Birth: [birth year] Age: 77 y.o. PCP: Josetta Huddle, MD  Vitals:   08/16/17 1003  BP: 126/70  Pulse: (!) 48  Resp: 18  SpO2: 96%  Weight: 275 lb 8 oz (125 kg)     [facility] Eval - 08/20/17 1000      Referral    Referring Provider  Pulmonary Rehab    Reason for referral  Obesitity/Overweight;Hypertension;Inactivity;Cancer;Orthopedic;Diabetes    Program Start Date  08/20/17      Measurement   Neck measurement  19.8 Inches    Waist Circumference  49 inches    Body fat  41 percent      Information for Trainer   Goals  "to build endurance,to walk 1/2-2/3 mile w/o stopping. To lose 12-15lbs."    Current Exercise  "nonthing for last few weeks"    Orthopedic Concerns  "back has been fixed and better but still has issues"    Pertinent Medical History  "ablation 04/2017"    Current Barriers  "back pain/issues & MD appts"      Timed Up and Go (TUGS)   Timed Up and Go  High risk >13 seconds 14.05secs   14.05secs     Mobility and Daily Activities   I find it easy to walk up or down two or more flights of stairs.  1    I have no trouble taking out the trash.  2    I do housework such as vacuuming and dusting on my own without difficulty.  2    I can easily lift a gallon of milk (8lbs).  4    I can easily walk a mile.  1    I have no trouble reaching into high cupboards or reaching down to pick up something from the floor.  1    I do not have trouble doing out-door work such as Armed forces logistics/support/administrative officer, raking leaves, or gardening.  1      Mobility and Daily Activities   I feel younger than my age.  2    I feel independent.  4    I feel energetic.  2    I live an active life.   4    I feel strong.  4    I feel healthy.  2    I feel active as other people my age.  1      How fit and strong are you.   Fit and Strong Total Score  31      Past Medical History:  Diagnosis Date  .  Anticoagulant long-term use   . Aortic root enlargement ([place])   . Ascending aortic aneurysm [facility])    recent scan in October 2012 showing no change; followed by Dr. Servando Snare  . ASCVD (arteriosclerotic cardiovascular disease)    Prior BMS to the 2nd OM in September 2012; with repeat cath in October showing patency  . CAD (coronary artery disease)    a. s/p BMS to 2nd OM in Sept 2012; b. LexiScan Myoview (12/2012):  Inf infarct; bowel and motion artifact make study difficult to interpret; no ischemia; not gated; Low Risk  . CHF (congestive heart failure) ([place])    no recent issues 10/13/14  . Chronic back pain    "all over my back" (05/11/2017)  . Colonic polyp   . Contact lens/glasses fitting   . Diastolic dysfunction   . DVT (deep venous  thrombosis) ([place])    ?LLE  . Frequent headaches    "probably weekly" (05/11/2017)  . Generalized headaches    neck stenosis  . GERD (gastroesophageal reflux disease)   . Hearing loss   . Hearing loss    more so on left  . Hemorrhoids   . History of stomach ulcers   . Hypertension   . IBS (irritable bowel syndrome)   . LVH (left ventricular hypertrophy)   . Mild intermittent asthma   . OA (osteoarthritis)    "all over" (05/11/2017)  . Obesities, morbid ([place])   . OSA (obstructive sleep apnea)    PSG 03/30/97 AHI 21, BPAP 13/9  . OSA on CPAP   . PAF (paroxysmal atrial fibrillation) ([place])    a. on Xarelto b. s/p DCCV in 08/2016; b. Tikosyn failed 04/16/17 with plans for Multaq and possible Afib ablation with Dr. Rayann Heman  . Pneumonia    'several times" (05/11/2017)  . Prostate CA [facility])    Oncologist  DR. [facility] 09/24/[facility], undetermined tx   prostate; S/P "radiation and hormone injections"  . Pulmonary embolism ([place]) 2008   "both lungs"  . SOB (shortness of breath)    on excertion  . Thoracic aortic aneurysm ([facility])    Aortic Size Index=     5.0    /Body surface area is 2.43 meters squared. = 2.05  < 2.75 cm/m2      4% risk per year 2.75 to  4.25          8% risk per year > 4.25 cm/m2    20% risk per year   Stable aneurysmal dilation of the ascending aorta with maximum AP diameter of 4.8 cm. Stable area of narrowing of the proximal most portion of the descending aorta measuring 2 cm., previously identified as an area of coarctation. No evidence of aortic dissection.  Coronary artery disease.  Normal appearance of the lungs.   Electronically Signed   By: Fidela Salisbury M.D.   On: 10/01/2014 08:50    . Type II diabetes mellitus ([place])    metphormin, average 154 dx 2017   Past Surgical History:  Procedure Laterality Date  . ACHILLES TENDON REPAIR Bilateral   . AORTIC ARCH ANGIOGRAPHY N/A 03/13/2017   Procedure: AORTIC ARCH ANGIOGRAPHY;  Surgeon: Martinique, [name], MD;  Location: [place] CV LAB;  Service: Cardiovascular;  Laterality: N/A;  . APPENDECTOMY    . ATRIAL FIBRILLATION ABLATION  05/11/2017  . ATRIAL FIBRILLATION ABLATION N/A 05/11/2017   Procedure: ATRIAL FIBRILLATION ABLATION;  Surgeon: [name], MD;  Location: [place] CV LAB;  Service: Cardiovascular;  Laterality: N/A;  . BACK SURGERY     "I've had 7 back and 1 neck ORs" (05/11/2017)  . CARDIAC CATHETERIZATION  2006  . CARPAL TUNNEL RELEASE Bilateral    LEFT  . CATARACT EXTRACTION W/ INTRAOCULAR LENS  IMPLANT, BILATERAL Bilateral   . CERVICAL SPINE SURGERY  06/02/2010   lower back and neck  . COLONOSCOPY WITH PROPOFOL N/A 12/29/2014   Procedure: COLONOSCOPY WITH PROPOFOL;  Surgeon: Garlan Fair, MD;  [facility]: [facility];  Service: Endoscopy;  Laterality: N/A;  . CORONARY ANGIOPLASTY WITH STENT PLACEMENT  October 2012  . CORONARY STENT PLACEMENT  Sept 2012   2nd OM with BMS  . HEMORROIDECTOMY    . LAMINECTOMY  05/30/2012   L 4 L5  . LAMINECTOMY WITH POSTERIOR LATERAL ARTHRODESIS LEVEL 3 N/A 10/18/2016   Procedure: Posterior Lateral Fusion - Lumbar One-Four, segmental  instrumentation Lumbar One-Five,  decompression,;  Surgeon: Eustace Moore, MD;   Location: [place];  Service: Neurosurgery;  Laterality: N/A;  . LAPAROSCOPIC CHOLECYSTECTOMY    . LAPAROSCOPIC GASTRIC BANDING    . LEFT AND RIGHT HEART CATHETERIZATION WITH CORONARY ANGIOGRAM N/A 05/07/2014   Procedure: LEFT AND RIGHT HEART CATHETERIZATION WITH CORONARY ANGIOGRAM;  Surgeon: [name] Martinique, MD;  Location: [facility] CATH LAB;  Service: Cardiovascular;  Laterality: N/A;  . LEFT HEART CATH AND CORONARY ANGIOGRAPHY N/A 03/13/2017   Procedure: LEFT HEART CATH AND CORONARY ANGIOGRAPHY;  Surgeon: Martinique, [name], MD;  Location: [place] CV LAB;  Service: Cardiovascular;  Laterality: N/A;  . LUMBAR LAMINECTOMY/DECOMPRESSION MICRODISCECTOMY N/A 05/04/2016   Procedure: Laminectomy and Foraminotomy - Thoracic twelve-Lumbar one -Posterior Fusion Lumbar one-two;  Surgeon: Eustace Moore, MD;  Location: [place];  Service: Neurosurgery;  Laterality: N/A;  . POSTERIOR LUMBAR FUSION  10/18/2016  . SHOULDER OPEN ROTATOR CUFF REPAIR Bilateral   . TONSILLECTOMY AND ADENOIDECTOMY    . TRIGGER FINGER RELEASE     LEFT  . UVULOPALATOPHARYNGOPLASTY    . VASECTOMY     Social History   Tobacco Use  Smoking Status Former Smoker  . Packs/day: 1.50  . Years: 30.00  . Pack years: 45.00  . Last attempt to quit: 01/24/1992  . Years since quitting: 25.5  Smokeless Tobacco Never Used    Mr. Whiteford will start the PREP w/his wife on 08/20/17 on Mon/Thurs 6pm-7pm x 12 weeks.    Vanita Ingles 08/20/2017, 10:10 AM

## 2017-08-23 ENCOUNTER — Telehealth: Payer: Self-pay | Admitting: Pulmonary Disease

## 2017-08-23 DIAGNOSIS — R0609 Other forms of dyspnea: Principal | ICD-10-CM

## 2017-08-23 DIAGNOSIS — R0600 Dyspnea, unspecified: Secondary | ICD-10-CM

## 2017-08-23 NOTE — Telephone Encounter (Signed)
Called and spoke with pt regarding cardiopulmonary stress test to assess dyspnea on exertion per VS Pt advised that Dr. Levander Campion is requesting him not to do this test for another 3-4 weeks  due to needing to be off few meds prior to test.  Ordered schedule a cardiopulmonary stress test to assess dyspnea on exertion today Advised VS of info

## 2017-08-27 ENCOUNTER — Encounter (HOSPITAL_COMMUNITY): Payer: Medicare Other

## 2017-08-29 NOTE — Progress Notes (Signed)
[facility] [employer] PREP Weekly Session   Patient Details  Name: Carlos Dawson MRN: [id number] Date of Birth: 1940-03-23 Age: 77 y.o. PCP: Josetta Huddle, MD  Vitals:   08/27/17 1446  Weight: 271 lb (122.9 kg)    [facility] [employer] Weekly seesion - 08/29/17 1400      Weekly Session   Topic Discussed  Other ways to be active      Fun things you did since last meeting:"went to the beach" Things you are grateful for:"my family and church" Barriers:"on going back problem, afib, aortic aneurysm"   Vanita Ingles 08/29/2017, 2:46 PM

## 2017-09-03 DIAGNOSIS — M533 Sacrococcygeal disorders, not elsewhere classified: Secondary | ICD-10-CM | POA: Diagnosis not present

## 2017-09-05 NOTE — Progress Notes (Signed)
[facility] PREP Weekly Session   Patient Details  Name: Carlos Dawson MRN: [id number] Date of Birth: Jul 22, 1940 Age: 77 y.o. PCP: Josetta Huddle, MD  Vitals:   09/05/17 1242  Weight: 269 lb 8 oz (122.2 kg)    [facility] [employer] Weekly seesion - 09/05/17 1200      Weekly Session   Topic Discussed  Healthy eating tips      Fun things you did since last meeting:"Visit family out of town" Things you are grateful for:"family, church, friends, 62 years of marriage" Barriers:"back problems"   Vanita Ingles 09/05/2017, 12:43 PM

## 2017-09-18 NOTE — Telephone Encounter (Signed)
Routing message to [facility] pool to check on status of appointment for patient to have cardiopulmonary stress test Please advise

## 2017-09-18 NOTE — Telephone Encounter (Signed)
Spoke with Judeen Hammans in [facility], appt is made for 09/26/17 Nothing further needed

## 2017-09-21 NOTE — Progress Notes (Signed)
[facility] PREP Weekly Session   Patient Details  Name: Carlos Dawson MRN: [id number] Date of Birth: 21-Mar-1940 Age: 77 y.o. PCP: Josetta Huddle, MD  Vitals:   09/10/17 1328  Weight: 266 lb 9.6 oz (120.9 kg)    [facility] [employer] Weekly seesion - 09/21/17 1300      Weekly Session   Minutes exercised this week  50 minutes   15cardio/35strength     Fun things you did since last meeting:"went to visit sone, daughter in law and granddaughter" Things you are grateful for:"family, church, friends" Barriers:"back problems, flexibility"  Vanita Ingles 09/21/2017, 1:32 PM

## 2017-09-26 ENCOUNTER — Ambulatory Visit (HOSPITAL_COMMUNITY): Payer: Medicare Other | Attending: Pulmonary Disease

## 2017-09-26 DIAGNOSIS — I712 Thoracic aortic aneurysm, without rupture, unspecified: Secondary | ICD-10-CM

## 2017-09-26 DIAGNOSIS — R0600 Dyspnea, unspecified: Secondary | ICD-10-CM | POA: Diagnosis not present

## 2017-10-01 ENCOUNTER — Encounter: Payer: Self-pay | Admitting: Cardiology

## 2017-10-02 NOTE — Telephone Encounter (Signed)
Please let him know preliminary results from cardiopulmonary exercise test shows limitation from obesity and deconditioning.  Will discuss in more detail at [facility] on 10/10/17.

## 2017-10-03 NOTE — Progress Notes (Signed)
[facility] PREP Weekly Session   Patient Details  Name: Carlos Dawson MRN: [id number] Date of Birth: December 15, 1940 Age: 77 y.o. PCP: Josetta Huddle, MD  Vitals:   10/01/17 1448  Weight: 272 lb 9.6 oz (123.7 kg)    [facility] [employer] Weekly seesion - 10/03/17 1400      Weekly Session   Topic Discussed  Stress management and problem solving      Fun things you did since last meeting:"spent day w/granddaughter" Things you are grateful for:"family" Barriers:"food"  Vanita Ingles 10/03/2017, 2:48 PM

## 2017-10-08 DIAGNOSIS — I712 Thoracic aortic aneurysm, without rupture: Secondary | ICD-10-CM | POA: Diagnosis not present

## 2017-10-10 ENCOUNTER — Encounter: Payer: Self-pay | Admitting: Pulmonary Disease

## 2017-10-10 ENCOUNTER — Ambulatory Visit (INDEPENDENT_AMBULATORY_CARE_PROVIDER_SITE_OTHER): Payer: Medicare Other | Admitting: Pulmonary Disease

## 2017-10-10 VITALS — BP 116/70 | HR 62 | Ht 66.0 in | Wt 273.8 lb

## 2017-10-10 DIAGNOSIS — R0600 Dyspnea, unspecified: Secondary | ICD-10-CM

## 2017-10-10 DIAGNOSIS — I2510 Atherosclerotic heart disease of native coronary artery without angina pectoris: Secondary | ICD-10-CM | POA: Diagnosis not present

## 2017-10-10 DIAGNOSIS — Z6841 Body Mass Index (BMI) 40.0 and over, adult: Secondary | ICD-10-CM | POA: Diagnosis not present

## 2017-10-10 DIAGNOSIS — R0609 Other forms of dyspnea: Secondary | ICD-10-CM

## 2017-10-10 DIAGNOSIS — J4530 Mild persistent asthma, uncomplicated: Secondary | ICD-10-CM | POA: Diagnosis not present

## 2017-10-10 DIAGNOSIS — G4733 Obstructive sleep apnea (adult) (pediatric): Secondary | ICD-10-CM

## 2017-10-10 NOTE — Patient Instructions (Signed)
Follow up in 6 months 

## 2017-10-10 NOTE — Progress Notes (Signed)
[facility]  Chief Complaint  Patient presents with  . Follow-up    feeling about the same.  not much improvement since last visit.      Constitutional: BP 116/70 (BP Location: Left Arm, Patient Position: Sitting)   Pulse 62   Ht 5\' 6"  (1.676 m)   Wt 273 lb 12.8 oz (124.2 kg)   SpO2 93%   BMI 44.19 kg/m   History of Present Illness: Carlos Dawson is a 77 y.o. male former smoker with dyspnea, OSA, and asthma.  He had cardiopulmonary stress test.  Main issue was deconditioning.  He is exercising at the [facility].  His wife feels he is slowing making progress.  He still gets winded and fatigued if he does too much.  Not having cough, wheeze, or chest congestion.  Only uses inhalers prn.  Has sneezing.  Remains on singulair.  Not having fever, hemoptysis, sore throat, or leg swelling.  Uses CPAP nightly w/o issue.  Comprehensive Respiratory Exam:  Appearance - well kempt  ENMT - nasal mucosa moist, turbinates clear, midline nasal septum, no dental lesions, no gingival bleeding, no oral exudates, no tonsillar hypertrophy Neck - no masses, trachea midline, no thyromegaly, no elevation in JVP Respiratory - normal appearance of chest wall, normal respiratory effort w/o accessory muscle use, no dullness on percussion, no wheezing or rales CV - s1s2 regular rate and rhythm, no murmurs, no peripheral edema, radial pulses symmetric GI - soft, non tender, no masses Lymph - no adenopathy noted in neck and axillary areas MSK - normal muscle strength and tone, normal gait Ext - no cyanosis, clubbing, or joint inflammation noted Skin - no rashes, lesions, or ulcers Neuro - oriented to person, place, and time Psych - normal mood and affect   Discussion: His recent cardiopulmonary stress showed main limitation related to body habitus and deconditioning  His Echo prior to cardiac ablation showed elevated pulmonary artery pressures.  If his PA pressures  are still elevated, then this could also be contributing to his dyspnea.  His asthma is not an issue at present, and he is compliant with CPAP.  Assessment/Plan:  Mild, persistent asthma. - continue singulair - prn symbicort, albuterol - he will get flu shot in November  Obstructive sleep apnea. - he is compliant with CPAP and reports benefit - continue CPAP 10 cm H2O  Allergic rhinitis with upper airway cough syndrome. - singulair with prn dymista  Deconditioning. - continue exercise regimen  A fib s/p ablation, chronic diastolic CHF. - he had elevated PA pressures on Echo prior to ablation - he has f/u with cardiology later this month - might need to repeat Echo, and if PA pressures still elevated then consider right heart cath  Ascending thoracic aortic aneurysm. - f/u with Dr. Servando Snare   Patient Instructions  Follow up in 6 months    Chesley Mires, MD [place] 10/10/2017, 12:54 PM Pager:  [phone]  Flow Sheet  Pulmonary tests: PFT 12/20/10 >> FEV1 2.13(79%), FEV1% 71, TLC 5.80(100%), DLCO 57%, no BD Spirometry 05/11/15 >> FEV1 1.68 (55%), FEV1% 69 FeNO 08/13/15 >> 13 Room air SpO2 with exertion 08/13/15 >> 87% - improved with 2 liters oxygen  Chest imaging: CT chest 09/14/11 >> Stable dilatation of ascending thoracic aorta 4.7 cm, PA 4.1 cm CT angio chest 10/01/14 >> aortic dilation CT angio chest 09/15/16 >> 5 cm ascending TAA  Sleep tests PSG 03/30/97 >> AHI 21 PSG 10/21/13 >> AHI 40.1,  SaO2 80%, PLMI 75.6 CPAP 12/28/13 >> CPAP 10 cm H2O >> AHI 1.5, +R  ONO with CPAP 08/19/15 >>test time 8 hrs 40 min. Basal SpO2 92.3%, low SpO2 86%. Spent 1.3 min with SpO2 <88%. CPAP 06/09/17 to 07/08/17 >> used on 30 of 30 nights with average 7 hs 35 min.  Average AHI 3.1 with CPAP 10 cm H2O  Cardiac tests Echo 03/07/17 >> mild LVH, EF 55 to 60%, mild AS, PAS 54 mmHg [facility] 03/13/17 >> patent Ost 2nd Mrg stent Cardiopulmonary exercise test 09/26/17 >>  deconditioning  Past Medical History: He  has a past medical history of Anticoagulant long-term use, Aortic root enlargement (HCC), Ascending aortic aneurysm (HCC), ASCVD (arteriosclerotic cardiovascular disease), CAD (coronary artery disease), CHF (congestive heart failure) ([place]), Chronic back pain, Colonic polyp, Contact lens/glasses fitting, Diastolic dysfunction, DVT (deep venous thrombosis) ([place]), Frequent headaches, Generalized headaches, GERD (gastroesophageal reflux disease), Hearing loss, Hearing loss, Hemorrhoids, History of stomach ulcers, Hypertension, IBS (irritable bowel syndrome), LVH (left ventricular hypertrophy), Mild intermittent asthma, OA (osteoarthritis), Obesities, morbid ([place]), OSA (obstructive sleep apnea), OSA on CPAP, PAF (paroxysmal atrial fibrillation) ([place]), Pneumonia, Prostate CA ([place]), Pulmonary embolism ([place]) (2008), SOB (shortness of breath), Thoracic aortic aneurysm ([place]), and Type II diabetes mellitus ([place]).  Past Surgical History: He  has a past surgical history that includes Hemorroidectomy; Vasectomy; Cervical spine surgery (06/02/2010); Achilles tendon repair (Bilateral); Coronary stent placement (Sept 2012); Appendectomy; Laminectomy (05/30/2012); left and right heart catheterization with coronary angiogram (N/A, 05/07/2014); Laparoscopic gastric banding; Uvulopalatopharyngoplasty; Colonoscopy with propofol (N/A, 12/29/2014); Carpal tunnel release (Bilateral); Trigger finger release; Lumbar laminectomy/decompression microdiscectomy (N/A, 05/04/2016); Posterior lumbar fusion (10/18/2016); Laminectomy with posterior lateral arthrodesis level 3 (N/A, 10/18/2016); LEFT HEART CATH AND CORONARY ANGIOGRAPHY (N/A, 03/13/2017); AORTIC ARCH ANGIOGRAPHY (N/A, 03/13/2017); ATRIAL FIBRILLATION ABLATION (05/11/2017); Laparoscopic cholecystectomy; Shoulder open rotator cuff repair (Bilateral); Back surgery; Cataract extraction w/ intraocular lens  implant, bilateral (Bilateral);  Tonsillectomy and adenoidectomy; Cardiac catheterization (2006); Coronary angioplasty with stent (October 2012); and ATRIAL FIBRILLATION ABLATION (N/A, 05/11/2017).  Family History: His family history includes Diabetes in his mother; Emphysema (age of onset: 85) in his father; Heart attack in his sister; Heart disease in his mother; Other in his mother.  Social History: He  reports that he quit smoking about 25 years ago. He has a 45.00 pack-year smoking history. He has never used smokeless tobacco. He reports that he does not drink alcohol or use drugs.  Medications: Allergies as of 10/10/2017      Reactions   Adhesive [tape] Itching, Rash   Latex Itching, Rash, Other (See Comments)   Bandaids   Morphine Itching      Medication List        Accurate as of 10/10/17 12:54 PM. Always use your most recent med list.          acetaminophen 500 MG tablet Commonly known as:  TYLENOL Take 1,000 mg by mouth every 8 (eight) hours as needed for mild pain or headache.   albuterol 108 (90 Base) MCG/ACT inhaler Commonly known as:  PROVENTIL HFA;VENTOLIN HFA Inhale 2 puffs into the lungs every 4 (four) hours as needed for wheezing or shortness of breath.   atorvastatin 10 MG tablet Commonly known as:  LIPITOR Take 5 mg by mouth daily at 6 PM.   augmented betamethasone dipropionate 0.05 % cream Commonly known as:  DIPROLENE-AF Apply 1 application topically 2 (two) times daily as needed for itching.   budesonide-formoterol 80-4.5 MCG/ACT inhaler Commonly known as:  SYMBICORT Inhale  2 puffs into the lungs 2 (two) times daily as needed (for shortness of breath).   celecoxib 200 MG capsule Commonly known as:  CELEBREX Take 200 mg by mouth 2 (two) times daily as needed for moderate pain.   eucerin cream Apply 1 application topically as needed for dry skin.   ferrous gluconate 324 MG tablet Commonly known as:  FERGON Take 324 mg by mouth every evening.   furosemide 80 MG  tablet Commonly known as:  LASIX Take 80 mg by mouth 2 (two) times daily.   KLOR-CON M20 20 MEQ tablet Generic drug:  potassium chloride SA TAKE 2 TABLETS BY MOUTH TWICE A DAY   losartan 25 MG tablet Commonly known as:  COZAAR Take 2 tablets (50 mg total) by mouth daily.   methocarbamol 500 MG tablet Commonly known as:  ROBAXIN Take 1 tablet (500 mg total) by mouth every 8 (eight) hours as needed for muscle spasms.   metoprolol tartrate 25 MG tablet Commonly known as:  LOPRESSOR Take 0.5 tablets (12.5 mg total) by mouth 2 (two) times daily.   montelukast 10 MG tablet Commonly known as:  SINGULAIR Take 10 mg by mouth at bedtime.   nitroGLYCERIN 0.4 MG SL tablet Commonly known as:  NITROSTAT Place 1 tablet (0.4 mg total) under the tongue every 5 (five) minutes as needed for chest pain.   omeprazole 20 MG capsule Commonly known as:  PRILOSEC Take 20 mg by mouth daily.   psyllium 58.6 % packet Commonly known as:  METAMUCIL Take 1 packet by mouth daily.   rivaroxaban 20 MG Tabs tablet Commonly known as:  XARELTO Take 1 tablet (20 mg total) by mouth daily with supper.   sildenafil 100 MG tablet Commonly known as:  VIAGRA Take 100 mg by mouth as needed for erectile dysfunction.   spironolactone 25 MG tablet Commonly known as:  ALDACTONE Take 25 mg by mouth daily.   SYSTANE OP Place 1 drop into both eyes daily as needed (for dry eyes).   tamsulosin 0.4 MG Caps capsule Commonly known as:  FLOMAX Take 0.4 mg by mouth every evening.   topiramate 25 MG capsule Commonly known as:  TOPAMAX Take 50 mg by mouth 2 (two) times daily.   traMADol 50 MG tablet Commonly known as:  ULTRAM Take 50 mg by mouth every 12 (twelve) hours as needed for moderate pain.   Vitamin D-3 5000 units Tabs Take 5,000 Units by mouth daily.

## 2017-10-12 NOTE — Progress Notes (Signed)
[facility] PRE[facility] Weekly Session   Patient Details  Name: TRAVELLE MCCLIMANS MRN: [id number] Date of Birth: [birth year] Age: 77 y.o. PCP: Josetta Huddle, MD  Vitals:   10/08/17 1437  Weight: 273 lb (123.8 kg)    [facility] Weekly seesion - 10/12/17 1400      Weekly Session   Topic Discussed  Importance of resistance training      Fun things you did since last meeting:"went to movie" Things you are grateful for:"family & church" Barriers:"weight"  Vanita Ingles 10/12/2017, 2:37 PM

## 2017-10-13 NOTE — Progress Notes (Signed)
Cardiology Office Note    Date:  10/15/2017   ID:  Carlos Dawson, DOB [birth year], MRN [id number]  PCP:  Josetta Huddle, MD  Cardiologist: Dr. Saylah Ketner [place]ique   Chief Complaint  Patient presents with  . Congestive Heart Failure  . Atrial Fibrillation  . Shortness of Breath    History of Present Illness:    Carlos Dawson is a 77 y.o. male with past medical history of CAD (s/p BMS to 2nd OM in 2012, low-risk NST in 12/2012), PAF (on Xarelto), chronic diastolic CHF, prior PE, HTN, HLD, Type 2 DM, and known thoracic aortic aneurysm who is seen for follow up dyspnea.   He was admitted from 8/23 - 09/15/2016 for evaluation of chest discomfort and palpitations. His initial EKG showed that he was in atrial fibrillation with RVR. He reported good compliance with his Xarelto and denied missing any doses, therefore a successful DCCV was performed in the ED at [place].  He became hypotensive with SBP in the 80's following the procedure, therefore he was admitted overnight for further observation. His Losartan, Spironolactone, and Lasix were held at the time of discharge with plans to resume at follow-up if BP allowed. A CTA was also obtained during admission to rule out a recurrent PE and was negative for a PE but did show a stable 5.0 cm ascending thoracic aortic aneurysm. Antihypertensive therapy was resumed. On 10/20/16 he underwent L1-5 fusion by Dr. Ronnald Ramp.   On follow up in February 2019   he noted increased symptoms of dyspnea on exertion and chest pressure for 3 months. Some improvement with inhalers.  No increase in edema or weight.  Oxygen levels were good. He does use CPAP. Myoview study was mildly abnormal with inferoapical reversible defect. EF 61%. Echo showed normal EF with moderate pulmonary HTN. Subsequent cardiac cath showed no obstructive CAD and no AI. Unable to cross AV due to distortion of the aortic root.  This spring he developed increased symptoms of arrhythmia despite Tikosyn. He  underwent EPS by Dr. Rayann Heman on 05/11/17. He had Afib and atrial flutter ablations. When seen by Dr. Rayann Heman on July 29 he had significant improvement in Afib symptoms and some improvement in dyspnea. Multaq was discontinued. Metoprolol was decreased. HR will go up with exercise to 110-130.   On 09/26/17 he had CPX to evaluate his dyspnea. This showed predominant limitation by obesity and deconditioning. He was seen by Dr. Halford Chessman who recommended repeat Echo and if pulmonary pressures still elevated to consider right heart cath. Of note right heart cath in 2016 demonstrated mild pulmonary HTN with elevated PCWP c/w diastolic dysfunction. Diuretic therapy was increased at that time without significant clinical improvement.  On follow up today [facility] a little improvement in breathing. Reports 5 lb weight loss at home. No cough. Thinks he still has some Afib 2-3 times a week based on his Apple watch. He notes some chest tightness with this.    Past Medical History:  Diagnosis Date  . Anticoagulant long-term use   . Aortic root enlargement ([place])   . Ascending aortic aneurysm [facility])    recent scan in October 2012 showing no change; followed by Dr. Servando Snare  . ASCVD (arteriosclerotic cardiovascular disease)    Prior BMS to the 2nd OM in September 2012; with repeat cath in October showing patency  . CAD (coronary artery disease)    a. s/p BMS to 2nd OM in Sept  2012; b. LexiScan Myoview (12/2012):  Inf infarct; bowel and motion artifact make study difficult to interpret; no ischemia; not gated; Low Risk  . CHF (congestive heart failure) ([place])    no recent issues 10/13/14  . Chronic back pain    "all over my back" (05/11/2017)  . Colonic polyp   . Contact lens/glasses fitting   . Diastolic dysfunction   . DVT (deep venous thrombosis) ([place])    ?LLE  . Frequent headaches    "probably weekly" (05/11/2017)  . Generalized headaches    neck stenosis  .  GERD (gastroesophageal reflux disease)   . Hearing loss   . Hearing loss    more so on left  . Hemorrhoids   . History of stomach ulcers   . Hypertension   . IBS (irritable bowel syndrome)   . LVH (left ventricular hypertrophy)   . Mild intermittent asthma   . OA (osteoarthritis)    "all over" (05/11/2017)  . Obesities, morbid ([place])   . OSA (obstructive sleep apnea)    PSG 03/30/97 AHI 21, BPAP 13/9  . OSA on CPAP   . PAF (paroxysmal atrial fibrillation) ([place])    a. on Xarelto b. s/p DCCV in 08/2016; b. Tikosyn failed 04/16/17 with plans for Multaq and possible Afib ablation with Dr. Rayann Heman  . Pneumonia    'several times" (05/11/2017)  . Prostate CA [facility])    Oncologist  DR. Dar[facility] 09/24/14, undetermined tx   prostate; S/P "radiation and hormone injections"  . Pulmonary embolism ([place]) 2008   "both lungs"  . SOB (shortness of breath)    on excertion  . Thoracic aortic aneurysm ([facility])    Aortic Size Index=     5.0    /Body surface area is 2.43 meters squared. = 2.05  < 2.75 cm/m2      4% risk per year 2.75 to 4.25          8% risk per year > 4.25 cm/m2    20% risk per year   Stable aneurysmal dilation of the ascending aorta with maximum AP diameter of 4.8 cm. Stable area of narrowing of the proximal most portion of the descending aorta measuring 2 cm., previously identified as an area of coarctation. No evidence of aortic dissection.  Coronary artery disease.  Normal appearance of the lungs.   Electronically Signed   By: Fidela Salisbury M.D.   On: 10/01/2014 08:50    . Type II diabetes mellitus ([place])    metphormin, average 154 dx 2017    Past Surgical History:  Procedure Laterality Date  . ACHILLES TENDON REPAIR Bilateral   . AORTIC ARCH ANGIOGRAPHY N/A 03/13/2017   Procedure: AORTIC ARCH ANGIOGRAPHY;  Surgeon: Martinique, Kashara Blocher M, MD;  Location: [facility];  Service: Cardiovascular;  Laterality: N/A;  . APPENDECTOMY    . ATRIAL FIBRILLATION ABLATION  05/11/2017  .  ATRIAL FIBRILLATION ABLATION N/A 05/11/2017   Procedure: ATRIAL FIBRILLATION ABLATION;  Surgeon: [name], MD;  Location: [place] [facility];  Service: Cardiovascular;  Laterality: N/A;  . BACK SURGERY     "I've had 7 back and 1 neck ORs" (05/11/2017)  . CARDIAC CATHETERIZATION  2006  . CARPAL TUNNEL RELEASE Bilateral    LEFT  . CATARACT EXTRACTION W/ INTRAOCULAR LENS  IMPLANT, BILATERAL Bilateral   . CERVICAL SPINE SURGERY  06/02/2010   lower back and neck  . COLONOSCOPY WITH PROPOFOL N/A 12/29/2014   Procedure: COLONOSCOPY WITH PROPOFOL;  Surgeon: Hassell Done  Sandria Senter, MD;  Location: [facility];  Service: Endoscopy;  Laterality: N/A;  . CORONARY ANGIOPLASTY WITH STENT PLACEMENT  October 2012  . CORONARY STENT PLACEMENT  Sept 2012   2nd OM with BMS  . HEMORROIDECTOMY    . LAMINECTOMY  05/30/2012   L 4 L5  . LAMINECTOMY WITH POSTERIOR LATERAL ARTHRODESIS LEVEL 3 N/A 10/18/2016   Procedure: Posterior Lateral Fusion - Lumbar One-Four, segmental instrumentation Lumbar One-Five,  decompression,;  Surgeon: Eustace Moore, MD;  Location: [facility] OR;  Service: Neurosurgery;  Laterality: N/A;  . LAPAROSCOPIC CHOLECYSTECTOMY    . LAPAROSCOPIC GASTRIC BANDING    . LEFT AND RIGHT HEART CATHETERIZATION WITH CORONARY ANGIOGRAM N/A 05/07/2014   Procedure: LEFT AND RIGHT HEART CATHETERIZATION WITH CORONARY ANGIOGRAM;  Surgeon: Raziah Funnell M Martinique, MD;  Location: [facility] CATH LAB;  Service: Cardiovascular;  Laterality: N/A;  . LEFT HEART CATH AND CORONARY ANGIOGRAPHY N/A 03/13/2017   Procedure: LEFT HEART CATH AND CORONARY ANGIOGRAPHY;  Surgeon: Martinique, Maggie Dworkin M, MD;  Location: [place] [facility];  Service: Cardiovascular;  Laterality: N/A;  . LUMBAR LAMINECTOMY/DECOMPRESSION MICRODISCECTOMY N/A 05/04/2016   Procedure: Laminectomy and Foraminotomy - Thoracic twelve-Lumbar one -Posterior Fusion Lumbar one-two;  Surgeon: Eustace Moore, MD;  Location: [facility];  Service: Neurosurgery;  Laterality: N/A;  . POSTERIOR LUMBAR FUSION   10/18/2016  . SHOULDER OPEN ROTATOR CUFF REPAIR Bilateral   . TONSILLECTOMY AND ADENOIDECTOMY    . TRIGGER FINGER RELEASE     LEFT  . UVULOPALATOPHARYNGOPLASTY    . VASECTOMY      Current Medications: Outpatient Medications Prior to Visit  Medication Sig Dispense Refill  . acetaminophen (TYLENOL) 500 MG tablet Take 1,000 mg by mouth every 8 (eight) hours as needed for mild pain or headache.     . albuterol (PROVENTIL HFA;VENTOLIN HFA) 108 (90 Base) MCG/ACT inhaler Inhale 2 puffs into the lungs every 4 (four) hours as needed for wheezing or shortness of breath.    Marland Kitchen atorvastatin (LIPITOR) 10 MG tablet Take 5 mg by mouth daily at 6 PM.     . augmented betamethasone dipropionate (DIPROLENE-AF) 0.05 % cream Apply 1 application topically 2 (two) times daily as needed for itching.  3  . budesonide-formoterol (SYMBICORT) 80-4.5 MCG/ACT inhaler Inhale 2 puffs into the lungs 2 (two) times daily as needed (for shortness of breath).     . celecoxib (CELEBREX) 200 MG capsule Take 200 mg by mouth 2 (two) times daily as needed for moderate pain.  0  . Cholecalciferol (VITAMIN D-3) 5000 units TABS Take 5,000 Units by mouth daily.    . furosemide (LASIX) 80 MG tablet Take 80 mg by mouth 2 (two) times daily.    Marland Kitchen KLOR-CON M20 20 MEQ tablet TAKE 2 TABLETS BY MOUTH TWICE A DAY 360 tablet 1  . losartan (COZAAR) 25 MG tablet Take 2 tablets (50 mg total) by mouth daily. 90 tablet 1  . methocarbamol (ROBAXIN) 500 MG tablet Take 1 tablet (500 mg total) by mouth every 8 (eight) hours as needed for muscle spasms. 60 tablet 1  . metoprolol tartrate (LOPRESSOR) 25 MG tablet Take 12.5 mg by mouth 2 (two) times daily.    . montelukast (SINGULAIR) 10 MG tablet Take 10 mg by mouth at bedtime.    . nitroGLYCERIN (NITROSTAT) 0.4 MG SL tablet Place 1 tablet (0.4 mg total) under the tongue every 5 (five) minutes as needed for chest pain. 25 tablet 5  . omeprazole (PRILOSEC) 20 MG capsule Take 20 mg  by mouth daily.    Vladimir Faster Glycol-Propyl Glycol (SYSTANE OP) Place 1 drop into both eyes daily as needed (for dry eyes).     . psyllium (METAMUCIL) 58.6 % packet Take 1 packet by mouth daily.    . rivaroxaban (XARELTO) 20 MG TABS tablet Take 1 tablet (20 mg total) by mouth daily with supper. 30 tablet   . sildenafil (VIAGRA) 100 MG tablet Take 100 mg by mouth as needed for erectile dysfunction.     . Skin Protectants, Misc. (EUCERIN) cream Apply 1 application topically as needed for dry skin.    Marland Kitchen spironolactone (ALDACTONE) 25 MG tablet Take 25 mg by mouth daily.    . tamsulosin (FLOMAX) 0.4 MG CAPS Take 0.4 mg by mouth every evening.     . topiramate (TOPAMAX) 25 MG capsule Take 50 mg by mouth 2 (two) times daily.     . traMADol (ULTRAM) 50 MG tablet Take 50 mg by mouth every 12 (twelve) hours as needed for moderate pain.     . ferrous gluconate (FERGON) 324 MG tablet Take 324 mg by mouth every evening.   1  . metoprolol tartrate (LOPRESSOR) 25 MG tablet Take 0.5 tablets (12.5 mg total) by mouth 2 (two) times daily. (Patient taking differently: Take 12.5 mg by mouth 2 (two) times daily. TAke another 1/2 tablet as needed as directed) 60 tablet 9   No facility-administered medications prior to visit.      Allergies:   Ace inhibitors; Amoxicillin-pot clavulanate; Adhesive [tape]; Latex; and Morphine   Social History   Socioeconomic History  . Marital status: Married    Spouse name: Not on file  . Number of children: 3  . Years of education: Not on file  . Highest education level: Not on file  Occupational History  . Occupation: Retired from Scientist, clinical (histocompatibility and immunogenetics): RETIRED  Social Needs  . Financial resource strain: Not on file  . Food insecurity:    Worry: Not on file    Inability: Not on file  . Transportation needs:    Medical: Not on file    Non-medical: Not on file  Tobacco Use  . Smoking status: Former Smoker    Packs/day: 1.50    Years: 30.00    Pack years: 45.00    Last attempt to quit:  01/24/1992    Years since quitting: 25.7  . Smokeless tobacco: Never Used  Substance and Sexual Activity  . Alcohol use: No  . Drug use: No  . Sexual activity: Not Currently  Lifestyle  . Physical activity:    Days per week: Not on file    Minutes per session: Not on file  . Stress: Not on file  Relationships  . Social connections:    Talks on phone: Not on file    Gets together: Not on file    Attends religious service: Not on file    Active member of club or organization: Not on file    Attends meetings of clubs or organizations: Not on file    Relationship status: Not on file  Other Topics Concern  . Not on file  Social History Narrative  . Not on file     Family History:  The patient's family history includes Diabetes in his mother; Emphysema (age of onset: 51) in his father; Heart attack in his sister; Heart disease in his mother; Other in his mother.   Review of Systems:   Please see the history of present illness.  All other systems reviewed and are otherwise negative except as noted above.   Physical Exam:    VS:  BP 112/62   Pulse (!) 58   Ht 5\' 6"  (1.676 m)   Wt 271 lb (122.9 kg)   BMI 43.74 kg/m    GENERAL:  Well appearing, morbidly obese WM in NAD HEENT:  PERRL, EOMI, sclera are clear. Oropharynx is clear. NECK:  No jugular venous distention, carotid upstroke brisk and symmetric, no bruits, no thyromegaly or adenopathy LUNGS:  Clear to auscultation bilaterally CHEST:  Unremarkable HEART:  RRR,  PMI not displaced or sustained,S1 and S2 within normal limits, no S3, no S4: no clicks, no rubs, no murmurs ABD:  Soft, nontender. BS +, no masses or bruits. No hepatomegaly, no splenomegaly EXT:  2 + pulses throughout, no edema, no cyanosis no clubbing SKIN:  Warm and dry.  No rashes NEURO:  Alert and oriented x 3. Cranial nerves II through XII intact. PSYCH:  Cognitively intact        Wt Readings from Last 3 Encounters:  10/15/17 271 lb (122.9 kg)    10/10/17 273 lb 12.8 oz (124.2 kg)  10/08/17 273 lb (123.8 kg)      Studies/Labs Reviewed:   EKG:  EKG is not ordered today.   Recent Labs: 04/15/2017: B Natriuretic Peptide 191.1 04/18/2017: Magnesium 2.2 04/30/2017: BUN 23; Creatinine, Ser 1.54; Hemoglobin 10.6; Platelets 128; Potassium 4.2; Sodium 143   Lipid Panel    Component Value Date/Time   CHOL 118 01/07/2013 0410   TRIG 133 01/07/2013 0410   HDL 40 01/07/2013 0410   CHOLHDL 3.0 01/07/2013 0410   VLDL 27 01/07/2013 0410   LDLCALC 51 01/07/2013 0410   Labs dated 07/11/16: cholesterol 114, triglycerides 80, HDL 46, LDL 52. A1c 7.1%.  Dated 01/24/17: cholesterol 110, triglycerides 50, HDL 52, LDL 48. A1c 7.8%.  Dated 07/16/17: cholesterol 106, triglycerides 72, HDL 50, LDL 41. Creatinine 1.36. Hgb 12.8. Other chemistries and TSH normal.    Additional studies/ records that were reviewed today include:   Myoview 03/06/17: Study Highlights    The left ventricular ejection fraction is normal (55-65%).  Nuclear stress EF: 61%.  There is a small defect of mild severity present in the basal inferior, mid inferior and apex location. The defect is partially reversible. Overall image quality is poor due to soft tissue attenuation. Cannot rule out a small area of ischemia.  There is evidence of transient ischemic dilatation with a TID of 1.27.  This is an intermediate risk study.  There was no ST segment deviation noted during stress    Echo 03/07/17: Study Conclusions  - Left ventricle: The cavity size was normal. Wall thickness was   increased in a pattern of mild LVH. Systolic function was normal.   The estimated ejection fraction was in the range of 55% to 60%.   Wall motion was normal; there were no regional wall motion   abnormalities. Left ventricular diastolic function parameters   were normal. - Aortic valve: There was very mild stenosis. Valve area (VTI):   1.81 cm^2. Valve area (Vmax): 1.94 cm^2. Valve area  (Vmean): 1.85   cm^2. - Right atrium: The atrium was mildly dilated. - Atrial septum: No defect or patent foramen ovale was identified. - Pulmonary arteries: PA peak pressure: 54 mm Hg (S).  Cardiac cath 03/15/17: Conclusion     Previously placed Ost 2nd Mrg to 2nd Mrg stent (unknown type) is widely patent.  There is no  aortic valve regurgitation.   1. No significant obstructive CAD 2. Aneurysmal thoracic aorta.   Plan: continue medical management . Consider pulmonary evaluation for symptoms of dyspnea.      Referred for: Dyspnea on exertion   Procedure: This patient underwent staged symptom-limited exercise testing using an individualized bike protocol with expired gas analysis metabolic evaluation during exercise.  Demographics  Age: 15 Ht. (in.) 67.0 Wt. (lb) 272.4 BMI: 42.7   Predicted Peak VO2: 16.0  Gender: Male Ht (cm) 170.2 Wt. (kg) 123.6    Results  Pre-Exercise PFTs   FVC 2.01 (54%)     FEV1 1.21 (43%)      FEV1/FVC 60      MVV 68 (60%)      Exercise Time:  7:00   Watts: 60  RPE: 15  Reason stopped: patient ended test due to dyspnea (9/10)  Additional symptoms: chest tightness (6/10) and lightheaded (5/10)  Resting HR: 55 Peak HR: 107  (49% age predicted max HR)  BP rest: 122/70 BP peak: 170/82  Peak VO2: 11.4 (71% predicted peak VO2)  VE/VCO2 slope: 40  OUES: 1.82  Peak RER: 0.96  Ventilatory Threshold: 8.7 (54% predicted and 76% measured peak VO2)  Peak RR 36  Peak Ventilation: 52.2  VE/MVV: 77%  PETCO2 at peak: 30  O2pulse: 13  (93% predicted O2pulse)   Interpretation  Notes: Patient gave a good effort although he was dyspneic. Pulse-oximetry at rest 97% with a low of 93% at peak exercise. Exercise was performed on a cycle ergometer starting at [facility] and increasing by 10W/min.   ECG:  Resting ECG in sinus bradycardia with 1st degree AV block and incomplete right bundle branch block. HR response  blunted. There were frequent PVCs at rest with suppression during exercise with no sustained arrhythmias and no ST-T changes. BP response appropriate.   PFT: Pre-exercise spirometry demonstrates severe obstruction. MVV below normal range.   CPX: Exercise Capacity- Exercise testing with gas exchange demonstrates a mildly reduced peak VO2 of 11.4 ml/kg/min (71% of the age/gender/weight matched sedentary norms). The RER of 0.96 indicates a submaximal effort. When adjusted to the patient's ideal body weight of 162.6 lb (73.7 kg) the peak VO2 is 16.0 ml/kg (ibw)/min (70% of the ibw-adjusted predicted).   Cardiovascular response- The O2pulse (a surrogate for strokevolume) increased with incremental exercise reaching peak of 13 ml/beat (93% predicted). DeltaVO2/Delta WR is 8.4 Indicating no clear evidence of cardiovascular impairment.  Ventilatory response- The VE/VCO2 slope is elevated and indicates Increased dead space ventilation. The oxygen uptake efficiency slope (OUES) is 1.82. The VO2 at the ventilatory threshold was normal at 54% of the predicted peak VO2 and 76% measured PVO2. At peak exercise, the ventilation reached 54% of the measured MVV and breathing reserve was 16 indicating ventilatory reserve was nearly depleted. PETCO2 was normal at 83mmHg during exercise.   Conclusion: The interpretation of this test is limited due to submaximal effort during the exercise. Based on available data, exercise testing with gas exchange demonstrates mild functional impairment when compared to matched sedentary norms. Suspect dyspnea is multifactorial, there is evidence of effect of deconditioning and primary limitation due to body habitus.   In addition he had inadequate heart rate response to exercise and a state of hyperventilation at peak exercise, which induced an elevated VE/VCO2 slope. He approached depletion of ventilatory reserve which may be due to his obstructive lung disease.  Test, report and  preliminary impression by: Landis Martins, MS, ACSM-RCEP 09/26/2017 3:03 PM  FINALIZED Marshell Garfinkel MD [facility] 10/08/2017, 10:04 AM  Assessment:    No diagnosis found.   Plan:   In order of problems listed above:  1. Paroxysmal Atrial Fibrillation/ Long-term Anticoagulation - s/p DCCV in August 2018.  - s/p Afib and Aflutter ablations in April 2019. Now off antiarrhythmic therapy. - still reports some break through based on his watch but much improved.  - This patients CHA2DS2-VASc Score and unadjusted Ischemic Stroke Rate (% per year) is equal to 9.7 % stroke rate/year from a score of 6 (HTN, DM, Vascular, Age, TE (2)). He denies any evidence of active bleeding. Continue Xarelto for anticoagulation.   2. CAD - s/p BMS to 2nd OM in 2012, cardiac cath Feb 2019 showed continued patency with nonobstructive disease. - Continue  statin therapy. No ASA secondary to need for Xarelto.  3. Chronic diastolic CHF - echo in 9201 showed a preserved EF of 55-60%. He has no significant increase in  edema on examination and weight is down.  -. Note right heart findings in 2016.  - continue current Lasix dosing of 80mg  BID.   4. HTN - BP is well-controlled  - continue  Losartan 25mg  daily, and Spironolactone 25mg  daily.  5. HLD -  LDL 52.  - continue Atorvastatin 10mg  daily.   6. Thoracic aortic aneurysm  - stable at 5.0 cm by recent CT Scan. - followed by Dr. Servando Snare with CT Surgery.  -  Understands that he would be very high risk for surgery with increased risk of major mortality and morbidity.  Explained that his aortic size really hasn't changed from 2000 so it is hard to justify such a major intervention.   7. Chronic respiratory failure due to morbid obesity and OSA. Followed by pulmonary. - recent CPX suggests major limitation is deconditioning and body habitus.  8. Pulmonary HTN secondary to #3 and #7.  Cardiac cath in 2016 showed mild pulmonary  venous HTN. Last Echo in February suggested higher pulmonary pressures. This was when he was having more difficulty with Afib. Will repeat Echo. Unless pulmonary pressures are quite high I am not sure if right heart cath really helpful but we will see.       Signed, Maciel Kegg [place], [place] 10/15/2017 8:23 AM    [facility] [address] [place], [place] [address] Phone: [phone]

## 2017-10-15 ENCOUNTER — Ambulatory Visit (INDEPENDENT_AMBULATORY_CARE_PROVIDER_SITE_OTHER): Payer: Medicare Other | Admitting: Cardiology

## 2017-10-15 ENCOUNTER — Encounter: Payer: Self-pay | Admitting: Cardiology

## 2017-10-15 VITALS — BP 112/62 | HR 58 | Ht 66.0 in | Wt 271.0 lb

## 2017-10-15 DIAGNOSIS — I712 Thoracic aortic aneurysm, without rupture, unspecified: Secondary | ICD-10-CM

## 2017-10-15 DIAGNOSIS — I272 Pulmonary hypertension, unspecified: Secondary | ICD-10-CM | POA: Diagnosis not present

## 2017-10-15 DIAGNOSIS — I5032 Chronic diastolic (congestive) heart failure: Secondary | ICD-10-CM

## 2017-10-15 DIAGNOSIS — I10 Essential (primary) hypertension: Secondary | ICD-10-CM

## 2017-10-15 DIAGNOSIS — I2510 Atherosclerotic heart disease of native coronary artery without angina pectoris: Secondary | ICD-10-CM

## 2017-10-15 DIAGNOSIS — I480 Paroxysmal atrial fibrillation: Secondary | ICD-10-CM | POA: Diagnosis not present

## 2017-10-15 NOTE — Patient Instructions (Signed)
Continue your current therapy  We will schedule you for repeat Echo  I will see you in 6 months

## 2017-10-16 ENCOUNTER — Other Ambulatory Visit: Payer: Self-pay | Admitting: Cardiology

## 2017-10-17 ENCOUNTER — Ambulatory Visit (HOSPITAL_COMMUNITY): Payer: Medicare Other | Attending: Cardiology

## 2017-10-17 ENCOUNTER — Other Ambulatory Visit: Payer: Self-pay

## 2017-10-17 DIAGNOSIS — I272 Pulmonary hypertension, unspecified: Secondary | ICD-10-CM

## 2017-10-17 DIAGNOSIS — I480 Paroxysmal atrial fibrillation: Secondary | ICD-10-CM

## 2017-10-17 DIAGNOSIS — I712 Thoracic aortic aneurysm, without rupture, unspecified: Secondary | ICD-10-CM

## 2017-10-17 DIAGNOSIS — I2510 Atherosclerotic heart disease of native coronary artery without angina pectoris: Secondary | ICD-10-CM | POA: Diagnosis not present

## 2017-10-17 DIAGNOSIS — I071 Rheumatic tricuspid insufficiency: Secondary | ICD-10-CM | POA: Insufficient documentation

## 2017-10-17 DIAGNOSIS — I270 Primary pulmonary hypertension: Secondary | ICD-10-CM | POA: Diagnosis not present

## 2017-10-17 DIAGNOSIS — E119 Type 2 diabetes mellitus without complications: Secondary | ICD-10-CM | POA: Diagnosis not present

## 2017-10-17 DIAGNOSIS — Z23 Encounter for immunization: Secondary | ICD-10-CM | POA: Diagnosis not present

## 2017-10-17 DIAGNOSIS — I5032 Chronic diastolic (congestive) heart failure: Secondary | ICD-10-CM | POA: Insufficient documentation

## 2017-10-17 DIAGNOSIS — Z87891 Personal history of nicotine dependence: Secondary | ICD-10-CM | POA: Diagnosis not present

## 2017-10-17 DIAGNOSIS — I4891 Unspecified atrial fibrillation: Secondary | ICD-10-CM | POA: Insufficient documentation

## 2017-10-17 DIAGNOSIS — M533 Sacrococcygeal disorders, not elsewhere classified: Secondary | ICD-10-CM | POA: Diagnosis not present

## 2017-10-17 DIAGNOSIS — I110 Hypertensive heart disease with heart failure: Secondary | ICD-10-CM | POA: Diagnosis not present

## 2017-10-17 DIAGNOSIS — M542 Cervicalgia: Secondary | ICD-10-CM | POA: Diagnosis not present

## 2017-10-17 DIAGNOSIS — I82409 Acute embolism and thrombosis of unspecified deep veins of unspecified lower extremity: Secondary | ICD-10-CM | POA: Insufficient documentation

## 2017-10-17 DIAGNOSIS — Z8249 Family history of ischemic heart disease and other diseases of the circulatory system: Secondary | ICD-10-CM | POA: Diagnosis not present

## 2017-10-17 DIAGNOSIS — G4733 Obstructive sleep apnea (adult) (pediatric): Secondary | ICD-10-CM | POA: Diagnosis not present

## 2017-10-17 DIAGNOSIS — I2699 Other pulmonary embolism without acute cor pulmonale: Secondary | ICD-10-CM | POA: Insufficient documentation

## 2017-10-17 DIAGNOSIS — M545 Low back pain: Secondary | ICD-10-CM | POA: Diagnosis not present

## 2017-10-17 DIAGNOSIS — E669 Obesity, unspecified: Secondary | ICD-10-CM | POA: Insufficient documentation

## 2017-10-17 DIAGNOSIS — I10 Essential (primary) hypertension: Secondary | ICD-10-CM

## 2017-10-17 DIAGNOSIS — Z6841 Body Mass Index (BMI) 40.0 and over, adult: Secondary | ICD-10-CM | POA: Diagnosis not present

## 2017-10-18 ENCOUNTER — Telehealth: Payer: Self-pay | Admitting: *Deleted

## 2017-10-18 NOTE — Telephone Encounter (Signed)
Pharm 

## 2017-10-18 NOTE — Telephone Encounter (Signed)
PRIMARY CARDIOLOGIST- DR P. Martinique       [facility] Pre-operative Risk Assessment    Request for surgical clearance:  1. What type of surgery is being performed? NOV 21,2019  2. When is this surgery scheduled? SI JOINT INJECTION  3. What type of clearance is required (medical clearance vs. Pharmacy clearance to hold med vs. Both)? BOTH  4. Are there any medications that need to be held prior to surgery and how long? XARELTO  FOR 3 DAYS PRIOR  5. Practice name and name of physician performing surgery? [facility]  ; DR Clydell Hakim   6. What is your office phone number [phone]    7.   What is your office fax number [phone]   8.   Anesthesia type (None, local, MAC, general) ? [place].   Carlos Dawson 10/18/2017, 10:50 AM  _________________________________________________________________   (provider comments below)

## 2017-10-19 NOTE — Telephone Encounter (Signed)
   Primary Cardiologist: Peter Martinique, MD  Chart reviewed as part of pre-operative protocol coverage. Patient was contacted 10/19/2017 in reference to pre-operative risk assessment for pending surgery as outlined below.  Carlos Dawson was last seen on 10/15/17 by Dr. Martinique. Pt was stable. With hx CAD, PAD abd cgribuc diastolic HF along with HTN.   Therefore, based on ACC/AHA guidelines, the patient would be at acceptable risk for the planned procedure without further cardiovascular testing.   See note from pharmacy for [facility].  I will route this recommendation to the requesting party via Epic fax function and remove from pre-op pool.  Please call with questions.  Cecilie Kicks, NP 10/19/2017, 2:27 PM

## 2017-10-19 NOTE — Telephone Encounter (Signed)
Patient with diagnosis of atrial fibrillation on Xarelto for anticoagulation.    Procedure: SI Joint injection Date of procedure: 12/13/17  CHADS2-VASc score of  7 (CHF, HTN, AGE, DM2, stroke/tia x 2 - pt with prior PE 2008, CAD, AGE,   CrCl 70.9 Platelet count 128  Per office protocol, patient can hold Xarelto for 3 days prior to procedure.  (was held x 2 days previously for lumbar fusion)   Patient should restart Xarelto on the evening of procedure or day after, at discretion of procedure MD

## 2017-10-23 NOTE — Progress Notes (Signed)
[facility] PREP Weekly Session   Patient Details  Name: Carlos Dawson MRN: [id number] Date of Birth: Jan 19, 1941 Age: 77 y.o. PCP: Josetta Huddle, MD  Vitals:   10/22/17 1347  Weight: 266 lb (120.7 kg)    [facility] Weekly seesion - 10/23/17 1300      Weekly Session   Topic Discussed  Other    Minutes exercised this week  --   SS class/PREP class workouts     Fun things you did since last meeting:"Face time w/granddaughter" Things you are grateful for:"family, friends, church" Barriers:"back, afib"  Vanita Ingles 10/23/2017, 1:47 PM

## 2017-10-25 ENCOUNTER — Telehealth: Payer: Self-pay

## 2017-10-25 NOTE — Telephone Encounter (Signed)
  Patient will be getting SI joint right injection per Dr office  They have refaxed the paperwork

## 2017-10-25 NOTE — Telephone Encounter (Signed)
   [facility] Pre-operative Risk Assessment    Opened in error

## 2017-10-26 ENCOUNTER — Other Ambulatory Visit: Payer: Self-pay | Admitting: *Deleted

## 2017-10-26 ENCOUNTER — Telehealth: Payer: Self-pay

## 2017-10-26 DIAGNOSIS — I712 Thoracic aortic aneurysm, without rupture, unspecified: Secondary | ICD-10-CM

## 2017-10-26 NOTE — Telephone Encounter (Signed)
   [facility] Pre-operative Risk Assessment    Request for surgical clearance:  1. What type of surgery is being performed? Epidural injection  2. When is this surgery scheduled? 12/13/17  3. What type of clearance is required (medical clearance vs. Pharmacy clearance to hold med vs. Both)? Pharmacy  4. Are there any medications that need to be held prior to surgery and how long? Xarelto 3 days prior   5. Practice name and name of physician performing surgery? [facility]  6. What is your office phone number [phone]   7.   What is your office fax number [phone]  8.   Anesthesia type (None, local, MAC, general) ? Not listed   Kathyrn Lass 10/26/2017, 1:33 PM  _________________________________________________________________   (provider comments below)

## 2017-10-26 NOTE — Progress Notes (Unsigned)
ct 

## 2017-10-26 NOTE — Telephone Encounter (Signed)
Clearance request was already addressed in 9/26 encounter.

## 2017-10-26 NOTE — Telephone Encounter (Signed)
   Primary Cardiologist: Peter Martinique, MD  Chart reviewed as part of pre-operative protocol coverage. Given past medical history and time since last visit, based on ACC/AHA guidelines, SHEEHAN STACEY would be at acceptable risk for the planned procedure without further cardiovascular testing.   OK to hold Xarelto 3 days pre op, resume ASAP post op.   I will route this recommendation to the requesting party via Epic fax function and remove from pre-op pool.  Please call with questions.  Kerin Ransom, PA-C 10/26/2017, 2:12 PM

## 2017-11-05 NOTE — Progress Notes (Signed)
[facility] PREP Weekly Session   Patient Details  Name: KAYLUM SHRUM MRN: [id number] Date of Birth: 24-Jul-1940 Age: 77 y.o. PCP: Josetta Huddle, MD  Vitals:   11/05/17 1317  Weight: 271 lb (122.9 kg)    [facility] [employer] Weekly seesion - 11/05/17 1300      Weekly Session   Topic Discussed  Water;Eating for the season      Fun things you did since last meeting:"went to granddaughters volleyball game" Things you are grateful for:"family & church" Barriers:"back"   Vanita Ingles 11/05/2017, 1:18 PM

## 2017-11-21 ENCOUNTER — Encounter (HOSPITAL_COMMUNITY): Payer: Self-pay | Admitting: Nurse Practitioner

## 2017-11-21 ENCOUNTER — Ambulatory Visit (HOSPITAL_COMMUNITY)
Admission: RE | Admit: 2017-11-21 | Discharge: 2017-11-21 | Disposition: A | Discharge status: Home / Self Care | Payer: Medicare Other | Source: Ambulatory Visit | Attending: Nurse Practitioner | Admitting: Nurse Practitioner

## 2017-11-21 VITALS — BP 118/64 | HR 65 | Ht 66.0 in | Wt 272.0 lb

## 2017-11-21 DIAGNOSIS — Z888 Allergy status to other drugs, medicaments and biological substances status: Secondary | ICD-10-CM | POA: Insufficient documentation

## 2017-11-21 DIAGNOSIS — Z91040 Latex allergy status: Secondary | ICD-10-CM | POA: Insufficient documentation

## 2017-11-21 DIAGNOSIS — Z6841 Body Mass Index (BMI) 40.0 and over, adult: Secondary | ICD-10-CM | POA: Diagnosis not present

## 2017-11-21 DIAGNOSIS — Z923 Personal history of irradiation: Secondary | ICD-10-CM | POA: Diagnosis not present

## 2017-11-21 DIAGNOSIS — Z8249 Family history of ischemic heart disease and other diseases of the circulatory system: Secondary | ICD-10-CM | POA: Insufficient documentation

## 2017-11-21 DIAGNOSIS — Z833 Family history of diabetes mellitus: Secondary | ICD-10-CM | POA: Diagnosis not present

## 2017-11-21 DIAGNOSIS — Z885 Allergy status to narcotic agent status: Secondary | ICD-10-CM | POA: Insufficient documentation

## 2017-11-21 DIAGNOSIS — Z9049 Acquired absence of other specified parts of digestive tract: Secondary | ICD-10-CM | POA: Insufficient documentation

## 2017-11-21 DIAGNOSIS — H9193 Unspecified hearing loss, bilateral: Secondary | ICD-10-CM | POA: Insufficient documentation

## 2017-11-21 DIAGNOSIS — Z9842 Cataract extraction status, left eye: Secondary | ICD-10-CM | POA: Insufficient documentation

## 2017-11-21 DIAGNOSIS — Z955 Presence of coronary angioplasty implant and graft: Secondary | ICD-10-CM | POA: Insufficient documentation

## 2017-11-21 DIAGNOSIS — Z8546 Personal history of malignant neoplasm of prostate: Secondary | ICD-10-CM | POA: Diagnosis not present

## 2017-11-21 DIAGNOSIS — G4733 Obstructive sleep apnea (adult) (pediatric): Secondary | ICD-10-CM | POA: Insufficient documentation

## 2017-11-21 DIAGNOSIS — Z8711 Personal history of peptic ulcer disease: Secondary | ICD-10-CM | POA: Insufficient documentation

## 2017-11-21 DIAGNOSIS — Z961 Presence of intraocular lens: Secondary | ICD-10-CM | POA: Insufficient documentation

## 2017-11-21 DIAGNOSIS — Z86718 Personal history of other venous thrombosis and embolism: Secondary | ICD-10-CM | POA: Insufficient documentation

## 2017-11-21 DIAGNOSIS — Z981 Arthrodesis status: Secondary | ICD-10-CM | POA: Insufficient documentation

## 2017-11-21 DIAGNOSIS — Z9884 Bariatric surgery status: Secondary | ICD-10-CM | POA: Insufficient documentation

## 2017-11-21 DIAGNOSIS — M1990 Unspecified osteoarthritis, unspecified site: Secondary | ICD-10-CM | POA: Insufficient documentation

## 2017-11-21 DIAGNOSIS — E119 Type 2 diabetes mellitus without complications: Secondary | ICD-10-CM | POA: Insufficient documentation

## 2017-11-21 DIAGNOSIS — I2510 Atherosclerotic heart disease of native coronary artery without angina pectoris: Secondary | ICD-10-CM | POA: Diagnosis not present

## 2017-11-21 DIAGNOSIS — Z7901 Long term (current) use of anticoagulants: Secondary | ICD-10-CM | POA: Diagnosis not present

## 2017-11-21 DIAGNOSIS — Z87891 Personal history of nicotine dependence: Secondary | ICD-10-CM | POA: Insufficient documentation

## 2017-11-21 DIAGNOSIS — I509 Heart failure, unspecified: Secondary | ICD-10-CM | POA: Diagnosis not present

## 2017-11-21 DIAGNOSIS — I712 Thoracic aortic aneurysm, without rupture: Secondary | ICD-10-CM | POA: Insufficient documentation

## 2017-11-21 DIAGNOSIS — Z86711 Personal history of pulmonary embolism: Secondary | ICD-10-CM | POA: Diagnosis not present

## 2017-11-21 DIAGNOSIS — Z9841 Cataract extraction status, right eye: Secondary | ICD-10-CM | POA: Insufficient documentation

## 2017-11-21 DIAGNOSIS — K219 Gastro-esophageal reflux disease without esophagitis: Secondary | ICD-10-CM | POA: Diagnosis not present

## 2017-11-21 DIAGNOSIS — I480 Paroxysmal atrial fibrillation: Secondary | ICD-10-CM | POA: Insufficient documentation

## 2017-11-21 DIAGNOSIS — Z880 Allergy status to penicillin: Secondary | ICD-10-CM | POA: Insufficient documentation

## 2017-11-21 DIAGNOSIS — I110 Hypertensive heart disease with heart failure: Secondary | ICD-10-CM | POA: Insufficient documentation

## 2017-11-21 DIAGNOSIS — Z79899 Other long term (current) drug therapy: Secondary | ICD-10-CM | POA: Insufficient documentation

## 2017-11-21 DIAGNOSIS — K589 Irritable bowel syndrome without diarrhea: Secondary | ICD-10-CM | POA: Diagnosis not present

## 2017-11-21 DIAGNOSIS — Z825 Family history of asthma and other chronic lower respiratory diseases: Secondary | ICD-10-CM | POA: Diagnosis not present

## 2017-11-21 NOTE — Progress Notes (Signed)
Primary Care Physician: Josetta Huddle, MD Referring Physician: Dr. Rayann Heman Cardiologist: Dr. Martinique   Carlos Dawson is a 77 y.o. male with a h/o CAD, CHF, thoracic  aneurysm, paroxysmal afib s/p ablation, 4/19, with Dr. Rayann Heman.  He had been on Multaq but but was stopped after ablation for c/o shortness of breath. He feels that the shortness of breath is still there but improved off drug. CXP done in September showed shortness of breath 2/2 deconditioning and obesity. He has some breakthrough afib but is acceptable. He just finished [facility] sponsored exercise program and plans to continue there 2x a week.   Today, he denies symptoms of palpitations, chest pain, shortness of breath, orthopnea, PND, lower extremity edema, dizziness, presyncope, syncope, or neurologic sequela. The patient is tolerating medications without difficulties and is otherwise without complaint today.   Past Medical History:  Diagnosis Date  . Anticoagulant long-term use   . Aortic root enlargement ([place])   . Ascending aortic aneurysm [facility])    recent scan in October 2012 showing no change; followed by Dr. Servando Snare  . ASCVD (arteriosclerotic cardiovascular disease)    Prior BMS to the 2nd OM in September 2012; with repeat cath in October showing patency  . CAD (coronary artery disease)    a. s/p BMS to 2nd OM in Sept 2012; b. LexiScan Myoview (12/2012):  Inf infarct; bowel and motion artifact make study difficult to interpret; no ischemia; not gated; Low Risk  . CHF (congestive heart failure) ([place])    no recent issues 10/13/14  . Chronic back pain    "all over my back" (05/11/2017)  . Colonic polyp   . Contact lens/glasses fitting   . Diastolic dysfunction   . DVT (deep venous thrombosis) ([place])    ?LLE  . Frequent headaches    "probably weekly" (05/11/2017)  . Generalized headaches    neck stenosis  . GERD (gastroesophageal reflux disease)   . Hearing loss   . Hearing loss    more so on left  . Hemorrhoids   .  History of stomach ulcers   . Hypertension   . IBS (irritable bowel syndrome)   . LVH (left ventricular hypertrophy)   . Mild intermittent asthma   . OA (osteoarthritis)    "all over" (05/11/2017)  . Obesities, morbid ([place])   . OSA (obstructive sleep apnea)    PSG 03/30/97 AHI 21, BPAP 13/9  . OSA on CPAP   . PAF (paroxysmal atrial fibrillation) ([place])    a. on Xarelto b. s/p DCCV in 08/2016; b. Tikosyn failed 04/16/17 with plans for Multaq and possible Afib ablation with Dr. Rayann Heman  . Pneumonia    'several times" (05/11/2017)  . Prostate CA [facility])    Oncologist  DR[facility] 09/24/14, undetermined tx   prostate; S/P "radiation and hormone injections"  . Pulmonary embolism ([place]) 2008   "both lungs"  . SOB (shortness of breath)    on excertion  . Thoracic aortic aneurysm (HCC)    Aortic Size Index=     5.0    /Body surface area is 2.43 meters squared. = 2.05  < 2.75 cm/m2      4% risk per year 2.75 to 4.25          8% risk per year > 4.25 cm/m2    20% risk per year   Stable aneurysmal dilation of the ascending aorta with maximum AP diameter of 4.8 cm. Stable area of narrowing of the proximal most portion  of the descending aorta measuring 2 cm., previously identified as an area of coarctation. No evidence of aortic dissection.  Coronary artery disease.  Normal appearance of the lungs.   Electronically Signed   By: Fidela Salisbury M.D.   On: 10/01/2014 08:50    . Type II diabetes mellitus ([place])    metphormin, average 154 dx 2017   Past Surgical History:  Procedure Laterality Date  . ACHILLES TENDON REPAIR Bilateral   . AORTIC ARCH ANGIOGRAPHY N/A 03/13/2017   Procedure: AORTIC ARCH ANGIOGRAPHY;  Surgeon: Martinique, [name], MD;  Location: [place] CV LAB;  Service: Cardiovascular;  Laterality: N/A;  . APPENDECTOMY    . ATRIAL FIBRILLATION ABLATION  05/11/2017  . ATRIAL FIBRILLATION ABLATION N/A 05/11/2017   Procedure: ATRIAL FIBRILLATION ABLATION;  Surgeon: [name], MD;   Location: [place] CV LAB;  Service: Cardiovascular;  Laterality: N/A;  . BACK SURGERY     "I've had 7 back and 1 neck ORs" (05/11/2017)  . CARDIAC CATHETERIZATION  2006  . CARPAL TUNNEL RELEASE Bilateral    LEFT  . CATARACT EXTRACTION W/ INTRAOCULAR LENS  IMPLANT, BILATERAL Bilateral   . CERVICAL SPINE SURGERY  06/02/2010   lower back and neck  . COLONOSCOPY WITH PROPOFOL N/A 12/29/2014   Procedure: COLONOSCOPY WITH PROPOFOL;  Surgeon: Garlan Fair, MD;  Location: [facility];  Service: Endoscopy;  Laterality: N/A;  . CORONARY ANGIOPLASTY WITH STENT PLACEMENT  October 2012  . CORONARY STENT PLACEMENT  Sept 2012   2nd OM with BMS  . HEMORROIDECTOMY    . LAMINECTOMY  05/30/2012   L 4 L5  . LAMINECTOMY WITH POSTERIOR LATERAL ARTHRODESIS LEVEL 3 N/A 10/18/2016   Procedure: Posterior Lateral Fusion - Lumbar One-Four, segmental instrumentation Lumbar One-Five,  decompression,;  Surgeon: Eustace Moore, MD;  Location: [facility] OR;  Service: Neurosurgery;  Laterality: N/A;  . LAPAROSCOPIC CHOLECYSTECTOMY    . LAPAROSCOPIC GASTRIC BANDING    . LEFT AND RIGHT HEART CATHETERIZATION WITH CORONARY ANGIOGRAM N/A 05/07/2014   Procedure: LEFT AND RIGHT HEART CATHETERIZATION WITH CORONARY ANGIOGRAM;  Surgeon: [name] Martinique, MD;  Location: [facility] CATH LAB;  Service: Cardiovascular;  Laterality: N/A;  . LEFT HEART CATH AND CORONARY ANGIOGRAPHY N/A 03/13/2017   Procedure: LEFT HEART CATH AND CORONARY ANGIOGRAPHY;  Surgeon: Martinique, [name], MD;  Location: [place] CV LAB;  Service: Cardiovascular;  Laterality: N/A;  . LUMBAR LAMINECTOMY/DECOMPRESSION MICRODISCECTOMY N/A 05/04/2016   Procedure: Laminectomy and Foraminotomy - Thoracic twelve-Lumbar one -Posterior Fusion Lumbar one-two;  Surgeon: Eustace Moore, MD;  Location: [place];  Service: Neurosurgery;  Laterality: N/A;  . POSTERIOR LUMBAR FUSION  10/18/2016  . SHOULDER OPEN ROTATOR CUFF REPAIR Bilateral   . TONSILLECTOMY AND ADENOIDECTOMY    . TRIGGER FINGER  RELEASE     LEFT  . UVULOPALATOPHARYNGOPLASTY    . VASECTOMY      Current Outpatient Medications  Medication Sig Dispense Refill  . acetaminophen (TYLENOL) 500 MG tablet Take 1,000 mg by mouth every 8 (eight) hours as needed for mild pain or headache.     . albuterol (PROVENTIL HFA;VENTOLIN HFA) 108 (90 Base) MCG/ACT inhaler Inhale 2 puffs into the lungs every 4 (four) hours as needed for wheezing or shortness of breath.    Marland Kitchen atorvastatin (LIPITOR) 10 MG tablet Take 5 mg by mouth daily at 6 PM.     . augmented betamethasone dipropionate (DIPROLENE-AF) 0.05 % cream Apply 1 application topically 2 (two) times daily as needed for itching.  3  . budesonide-formoterol (SYMBICORT) 80-4.5 MCG/ACT inhaler Inhale 2 puffs into the lungs 2 (two) times daily as needed (for shortness of breath).     . Cholecalciferol (VITAMIN D-3) 5000 units TABS Take 5,000 Units by mouth daily.    . furosemide (LASIX) 80 MG tablet Take 80 mg by mouth 2 (two) times daily.    Marland Kitchen KLOR-CON M20 20 MEQ tablet TAKE 2 TABLETS BY MOUTH TWICE A DAY 360 tablet 1  . losartan (COZAAR) 25 MG tablet Take 25 mg by mouth daily.    . methocarbamol (ROBAXIN) 500 MG tablet Take 1 tablet (500 mg total) by mouth every 8 (eight) hours as needed for muscle spasms. 60 tablet 1  . metoprolol tartrate (LOPRESSOR) 25 MG tablet Take 12.5 mg by mouth 2 (two) times daily.    . montelukast (SINGULAIR) 10 MG tablet Take 10 mg by mouth at bedtime.    Marland Kitchen omeprazole (PRILOSEC) 20 MG capsule Take 20 mg by mouth daily.    Vladimir Faster Glycol-Propyl Glycol (SYSTANE OP) Place 1 drop into both eyes daily as needed (for dry eyes).     . psyllium (METAMUCIL) 58.6 % packet Take 1 packet by mouth daily.    . rivaroxaban (XARELTO) 20 MG TABS tablet Take 1 tablet (20 mg total) by mouth daily with supper. 30 tablet   . sildenafil (VIAGRA) 100 MG tablet Take 100 mg by mouth as needed for erectile dysfunction.     . Skin Protectants, Misc. (EUCERIN) cream Apply 1  application topically as needed for dry skin.    Marland Kitchen spironolactone (ALDACTONE) 25 MG tablet Take 25 mg by mouth daily.    . tamsulosin (FLOMAX) 0.4 MG CAPS Take 0.4 mg by mouth every evening.     . topiramate (TOPAMAX) 25 MG capsule Take 50 mg by mouth 2 (two) times daily.     . traMADol (ULTRAM) 50 MG tablet Take 50 mg by mouth every 12 (twelve) hours as needed for moderate pain.     . celecoxib (CELEBREX) 200 MG capsule Take 200 mg by mouth 2 (two) times daily as needed for moderate pain.  0  . nitroGLYCERIN (NITROSTAT) 0.4 MG SL tablet Place 1 tablet (0.4 mg total) under the tongue every 5 (five) minutes as needed for chest pain. (Patient not taking: Reported on 11/21/2017) 25 tablet 5   No current facility-administered medications for this encounter.     Allergies  Allergen Reactions  . Ace Inhibitors     Other reaction(s): Cough (ALLERGY/intolerance), Cough (ALLERGY/intolerance)  . Amoxicillin-Pot Clavulanate     Other reaction(s): GI Upset (intolerance)  . Adhesive [Tape] Itching and Rash  . Latex Itching, Rash and Other (See Comments)    Bandaids  . Morphine Itching    Social History   Socioeconomic History  . Marital status: Married    Spouse name: Not on file  . Number of children: 3  . Years of education: Not on file  . Highest education level: Not on file  Occupational History  . Occupation: Retired from Scientist, clinical (histocompatibility and immunogenetics): RETIRED  Social Needs  . Financial resource strain: Not on file  . Food insecurity:    Worry: Not on file    Inability: Not on file  . Transportation needs:    Medical: Not on file    Non-medical: Not on file  Tobacco Use  . Smoking status: Former Smoker    Packs/day: 1.50    Years: 30.00    Pack years: 45.00  Last attempt to quit: 01/24/1992    Years since quitting: 25.8  . Smokeless tobacco: Never Used  Substance and Sexual Activity  . Alcohol use: No  . Drug use: No  . Sexual activity: Not Currently  Lifestyle  . Physical  activity:    Days per week: Not on file    Minutes per session: Not on file  . Stress: Not on file  Relationships  . Social connections:    Talks on phone: Not on file    Gets together: Not on file    Attends religious service: Not on file    Active member of club or organization: Not on file    Attends meetings of clubs or organizations: Not on file    Relationship status: Not on file  . Intimate partner violence:    Fear of current or ex partner: Not on file    Emotionally abused: Not on file    Physically abused: Not on file    Forced sexual activity: Not on file  Other Topics Concern  . Not on file  Social History Narrative  . Not on file    Family History  Problem Relation Age of Onset  . Heart disease Mother   . Diabetes Mother   . Other Mother        stent placement  . Emphysema Father 43  . Heart attack Sister     ROS- All systems are reviewed and negative except as per the HPI above  Physical Exam: Vitals:   11/21/17 1002  BP: 118/64  Pulse: 65  Weight: 123.4 kg  Height: 5\' 6"  (1.676 m)   Wt Readings from Last 3 Encounters:  11/21/17 123.4 kg  11/05/17 122.9 kg  10/22/17 120.7 kg    Labs: Lab Results  Component Value Date   NA 143 04/30/2017   K 4.2 04/30/2017   CL 105 04/30/2017   CO2 24 04/30/2017   GLUCOSE 154 (H) 04/30/2017   BUN 23 04/30/2017   CREATININE 1.54 (H) 04/30/2017   CALCIUM 8.5 (L) 04/30/2017   MG 2.2 04/18/2017   Lab Results  Component Value Date   INR 1.3 (H) 03/08/2017   Lab Results  Component Value Date   CHOL 118 01/07/2013   HDL 40 01/07/2013   LDLCALC 51 01/07/2013   TRIG 133 01/07/2013     GEN- The patient is well appearing, alert and oriented x 3 today.   Head- normocephalic, atraumatic Eyes-  Sclera clear, conjunctiva pink Ears- hearing intact Oropharynx- clear Neck- supple, no JVP Lymph- no cervical lymphadenopathy Lungs- Clear to ausculation bilaterally, normal work of breathing Heart- Regular rate  and rhythm, no murmurs, rubs or gallops, PMI not laterally displaced GI- soft, NT, ND, + BS Extremities- no clubbing, cyanosis, or edema MS- no significant deformity or atrophy Skin- no rash or lesion Psych- euthymic mood, full affect Neuro- strength and sensation are intact  EKG- Sinus rhythm  at 65 bpm, pr int 234 ms, qrs int 96 ms, qtc 434 ms Epic records reviewed    Assessment and Plan: 1. Paroxysmal afib  S/p ablation In SR today He feels afib burden has been stable, acceptable Off multaq Continue xarelto 20 mg qd for CHA2DS2VASc score of at least 5   2. CAD Per Dr. Martinique Stable  F/u with Dr. Rayann Heman February 2020  Butch Penny C. Jonnelle Lawniczak, [facility] [address] [place], [place] [address] [phone]

## 2017-11-23 NOTE — Progress Notes (Signed)
[place] Report   Patient Details  Name: KALI AMBLER MRN: [id number] Date of Birth: 04-18-1940 Age: 77 y.o. PCP: Josetta Huddle, MD  Vitals:   11/23/17 1459  Weight: 270 lb (122.5 kg)     [facility] Eval - 11/23/17 1500      Measurement   Neck measurement  19 Inches    Waist Circumference  49 inches      Information for Trainer   Goals  States he can walk 1/2 mile, which was one of his goals.      Current Exercise  He is now exercising 2-3x/wk     Orthopedic Concerns  back pain has improved but still hurts    Current Barriers  goes into AFIB at times.     Medications that affect exercise  --   states no longer on O2 at bedtime     Timed Up and Go (TUGS)   Timed Up and Go  Moderate risk 10-12 seconds   9.38     Mobility and Daily Activities   I find it easy to walk up or down two or more flights of stairs.  1    I have no trouble taking out the trash.  4    I do housework such as vacuuming and dusting on my own without difficulty.  2    I can easily lift a gallon of milk (8lbs).  4    I can easily walk a mile.  1    I have no trouble reaching into high cupboards or reaching down to pick up something from the floor.  2    I do not have trouble doing out-door work such as Armed forces logistics/support/administrative officer, raking leaves, or gardening.  2      Mobility and Daily Activities   I feel younger than my age.  3    I feel independent.  4    I feel energetic.  3    I live an active life.   4    I feel strong.  4    I feel healthy.  3    I feel active as other people my age.  3      How fit and strong are you.   Fit and Strong Total Score  40      Past Medical History:  Diagnosis Date  . Anticoagulant long-term use   . Aortic root enlargement ([place])   . Ascending aortic aneurysm [facility])    recent scan in October 2012 showing no change; followed by Dr. Servando Snare  . ASCVD (arteriosclerotic cardiovascular disease)    Prior BMS to the 2nd OM in September 2012; with repeat  cath in October showing patency  . CAD (coronary artery disease)    a. s/p BMS to 2nd OM in Sept 2012; b. LexiScan Myoview (12/2012):  Inf infarct; bowel and motion artifact make study difficult to interpret; no ischemia; not gated; Low Risk  . CHF (congestive heart failure) ([place])    no recent issues 10/13/14  . Chronic back pain    "all over my back" (05/11/2017)  . Colonic polyp   . Contact lens/glasses fitting   . Diastolic dysfunction   . DVT (deep venous thrombosis) ([place])    ?LLE  . Frequent headaches    "probably weekly" (05/11/2017)  . Generalized headaches    neck stenosis  . GERD (gastroesophageal reflux disease)   . Hearing loss   . Hearing loss  more so on left  . Hemorrhoids   . History of stomach ulcers   . Hypertension   . IBS (irritable bowel syndrome)   . LVH (left ventricular hypertrophy)   . Mild intermittent asthma   . OA (osteoarthritis)    "all over" (05/11/2017)  . Obesities, morbid ([place])   . OSA (obstructive sleep apnea)    PSG 03/30/97 AHI 21, BPAP 13/9  . OSA on CPAP   . PAF (paroxysmal atrial fibrillation) ([place])    a. on Xarelto b. s/p DCCV in 08/2016; b. Tikosyn failed 04/16/17 with plans for Multaq and possible Afib ablation with Dr. Rayann Heman  . Pneumonia    'several times" (05/11/2017)  . Prostate CA [facility])    Oncologist  DR[facility] 09[facility], undetermined tx   prostate; S/P "radiation and hormone injections"  . Pulmonary embolism ([place]) 2008   "both lungs"  . SOB (shortness of breath)    on excertion  . Thoracic aortic aneurysm (HCC)    Aortic Size Index=     5.0    /Body surface area is 2.43 meters squared. = 2.05  < 2.75 cm/m2      4% risk per year 2.75 to 4.25          8% risk per year > 4.25 cm/m2    20% risk per year   Stable aneurysmal dilation of the ascending aorta with maximum AP diameter of 4.8 cm. Stable area of narrowing of the proximal most portion of the descending aorta measuring 2 cm., previously identified as an area of  coarctation. No evidence of aortic dissection.  Coronary artery disease.  Normal appearance of the lungs.   Electronically Signed   By: Fidela Salisbury M.D.   On: 10/01/2014 08:50    . Type II diabetes mellitus ([place])    metphormin, average 154 dx 2017   Past Surgical History:  Procedure Laterality Date  . ACHILLES TENDON REPAIR Bilateral   . AORTIC ARCH ANGIOGRAPHY N/A 03/13/2017   Procedure: AORTIC ARCH ANGIOGRAPHY;  Surgeon: Martinique, [name], MD;  Location: [place] CV LAB;  Service: Cardiovascular;  Laterality: N/A;  . APPENDECTOMY    . ATRIAL FIBRILLATION ABLATION  05/11/2017  . ATRIAL FIBRILLATION ABLATION N/A 05/11/2017   Procedure: ATRIAL FIBRILLATION ABLATION;  Surgeon: [name], MD;  Location: [place] CV LAB;  Service: Cardiovascular;  Laterality: N/A;  . BACK SURGERY     "I've had 7 back and 1 neck ORs" (05/11/2017)  . CARDIAC CATHETERIZATION  2006  . CARPAL TUNNEL RELEASE Bilateral    LEFT  . CATARACT EXTRACTION W/ INTRAOCULAR LENS  IMPLANT, BILATERAL Bilateral   . CERVICAL SPINE SURGERY  06/02/2010   lower back and neck  . COLONOSCOPY WITH PROPOFOL N/A 12/29/2014   Procedure: COLONOSCOPY WITH PROPOFOL;  Surgeon: Garlan Fair, MD;  [facility];  Service: Endoscopy;  Laterality: N/A;  . CORONARY ANGIOPLASTY WITH STENT PLACEMENT  October 2012  . CORONARY STENT PLACEMENT  Sept 2012   2nd OM with BMS  . HEMORROIDECTOMY    . LAMINECTOMY  05/30/2012   L 4 L5  . LAMINECTOMY WITH POSTERIOR LATERAL ARTHRODESIS LEVEL 3 N/A 10/18/2016   Procedure: Posterior Lateral Fusion - Lumbar One-Four, segmental instrumentation Lumbar One-Five,  decompression,;  Surgeon: Eustace Moore, MD;  Location: [facility] OR;  Service: Neurosurgery;  Laterality: N/A;  . LAPAROSCOPIC CHOLECYSTECTOMY    . LAPAROSCOPIC GASTRIC BANDING    . LEFT AND RIGHT HEART CATHETERIZATION WITH CORONARY ANGIOGRAM  N/A 05/07/2014   Procedure: LEFT AND RIGHT HEART CATHETERIZATION WITH CORONARY ANGIOGRAM;   Surgeon: [name] Martinique, MD;  [facility] C[facility] LAB;  Service: Cardiovascular;  Laterality: N/A;  . LEFT HEART CATH AND CORONARY ANGIOGRAPHY N/A 03/13/2017   Procedure: LEFT HEART CATH AND CORONARY ANGIOGRAPHY;  Surgeon: Martinique, [name], MD;  Location: [place] CV LAB;  Service: Cardiovascular;  Laterality: N/A;  . LUMBAR LAMINECTOMY/DECOMPRESSION MICRODISCECTOMY N/A 05/04/2016   Procedure: Laminectomy and Foraminotomy - Thoracic twelve-Lumbar one -Posterior Fusion Lumbar one-two;  Surgeon: Eustace Moore, MD;  Location: [place];  Service: Neurosurgery;  Laterality: N/A;  . POSTERIOR LUMBAR FUSION  10/18/2016  . SHOULDER OPEN ROTATOR CUFF REPAIR Bilateral   . TONSILLECTOMY AND ADENOIDECTOMY    . TRIGGER FINGER RELEASE     LEFT  . UVULOPALATOPHARYNGOPLASTY    . VASECTOMY     Social History   Tobacco Use  Smoking Status Former Smoker  . Packs/day: 1.50  . Years: 30.00  . Pack years: 45.00  . Last attempt to quit: 01/24/1992  . Years since quitting: 25.8  Smokeless Tobacco Never Used    Mr. Dapolito and his wife had very good attendance in their 2x/week, 12-week PREP program.  They both had impressive assessment outcomes.  Below are those of Mr. Stemmer:      PRE-program   POST-program 2 min. March test  166    200 30 sec. Chair stand  14    Passed d/t back pain 30 sec. Arm curl  12    27  Mr. Skibinski expressed he would like to be able to regain normal breathing as a long-term goal. He plans to exercise 2-3x/wk as well as walk the stairs at home on a daily basis.     Vanita Ingles 11/23/2017, 3:03 PM

## 2017-11-26 DIAGNOSIS — H52223 Regular astigmatism, bilateral: Secondary | ICD-10-CM | POA: Diagnosis not present

## 2017-11-26 DIAGNOSIS — E119 Type 2 diabetes mellitus without complications: Secondary | ICD-10-CM | POA: Diagnosis not present

## 2017-11-26 DIAGNOSIS — H524 Presbyopia: Secondary | ICD-10-CM | POA: Diagnosis not present

## 2017-11-26 DIAGNOSIS — H5203 Hypermetropia, bilateral: Secondary | ICD-10-CM | POA: Diagnosis not present

## 2017-11-26 DIAGNOSIS — Z961 Presence of intraocular lens: Secondary | ICD-10-CM | POA: Diagnosis not present

## 2017-12-06 ENCOUNTER — Ambulatory Visit (INDEPENDENT_AMBULATORY_CARE_PROVIDER_SITE_OTHER): Payer: Medicare Other | Admitting: Cardiothoracic Surgery

## 2017-12-06 ENCOUNTER — Ambulatory Visit
Admission: RE | Admit: 2017-12-06 | Discharge: 2017-12-06 | Disposition: A | Discharge status: Home / Self Care | Payer: Medicare Other | Source: Ambulatory Visit | Attending: Cardiothoracic Surgery | Admitting: Cardiothoracic Surgery

## 2017-12-06 VITALS — BP 101/53 | HR 60 | Resp 20 | Ht 66.0 in | Wt 270.0 lb

## 2017-12-06 DIAGNOSIS — I712 Thoracic aortic aneurysm, without rupture, unspecified: Secondary | ICD-10-CM

## 2017-12-06 DIAGNOSIS — I2510 Atherosclerotic heart disease of native coronary artery without angina pectoris: Secondary | ICD-10-CM

## 2017-12-06 MED ORDER — IOPAMIDOL (ISOVUE-370) INJECTION 76%
75.0000 mL | Freq: Once | INTRAVENOUS | Status: AC | PRN
  Administered 2017-12-06: 75 mL via INTRAVENOUS

## 2017-12-06 NOTE — Progress Notes (Signed)
Carlos Dawson       ,[place] [address]             [id number]                   [name] [place] Medical Record #[id number] Date of Birth: [birth year]  Referring: Martinique, [name], MD Primary Care: Carlos Huddle, MD  Chief Complaint:    Chief Complaint  Patient presents with  . Thoracic Aortic Aneurysm    6 month f/u with Chest CTA    History of Present Illness:    Patient returns today for followup CT scan 6 months after previous scan for evaluation of dilated ascending  Aorta. He  has known CAD with  a stent was placed in his circumflex coronary artery in the past.   His previous diagnosis of prostate cancer treatment at [facility].  Patient has significant underlying pulmonary disease.  He has had difficulty ambulating because of chronic back pain.  He is also on anticoagulation for atrial fibrillation.  He notes that he has had an A. fib ablation recently.  In office today he was in sinus rhythm.  Over the past 6 months he has been using his apple watch and has documented multiple episodes of atrial fibrillation.  Since last seen a cardiopulmonary stress test was done, which demonstrated reduced O2 consumption, low maximal exercise, severely limited FEV1 Patient does [facility], has lost some weight and knows overall feels like his physical endurance has improved over the past 6 months  Since last seen the patient is undergone a cardiac catheterization in February 2019, no significant CAD was noted, no aortic valve insufficiency    Current Activity/ Functional Status: Patient is independent with mobility/ambulation, transfers, ADL's, IADL's.   Past Medical History  Diagnosis Date  . Hypertension   . Obesities, morbid   . Pulmonary embolism     2008  . Anticoagulant long-term use   . CAD (coronary artery disease)     a. s/p BMS to 2nd OM in Sept 2012; b. LexiScan Myoview (12/2012):  Inf  infarct; bowel and motion artifact make study difficult to interpret; no ischemia; not gated; Low Risk  . OA (osteoarthritis)   . LVH (left ventricular hypertrophy)   . Colonic polyp   . Mild intermittent asthma   . Hemorrhoids   . Generalized headaches   . SOB (shortness of breath)     using oxygen with exercise  . Diastolic dysfunction   . ASCVD (arteriosclerotic cardiovascular disease)     Prior BMS to the 2nd OM in September 2012; with repeat cath in October showing patency  . GERD (gastroesophageal reflux disease)   . IBS (irritable bowel syndrome)   . Aortic root enlargement   .        Marland Kitchen PAF (paroxysmal atrial fibrillation)     treated with Coumadin  . Hearing loss   . Contact lens/glasses fitting   . OSA (obstructive sleep apnea)     PSG 03/30/97 AHI 21, BPAP 13/9  . Sleep apnea   . Prostate ca   . Thoracic aortic aneurysm     Aortic Size Index=     5.0    /Body surface area is 2.43 meters squared. = 2.05  < 2.75 cm/m2      4% risk per year 2.75 to 4.25  8% risk per year > 4.25 cm/m2    20% risk per year   Stable aneurysmal dilation of the ascending aorta with maximum AP diameter of 4.8 cm. Stable area of narrowing of the proximal most portion of the descending aorta measuring 2 cm., previously identified as an area of coarctation. No evidence of aortic dissection.  Coronary artery disease.  Normal appearance of the lungs.   Electronically Signed   By: Fidela Salisbury Dawson.D.   On: 10/01/2014 08:50      Past Surgical History:  Procedure Laterality Date  . ACHILLES TENDON REPAIR Bilateral   . AORTIC ARCH ANGIOGRAPHY N/A 03/13/2017   Procedure: AORTIC ARCH ANGIOGRAPHY;  Surgeon: Martinique, [name], MD;  Location: [place] [facility];  Service: Cardiovascular;  Laterality: N/A;  . APPENDECTOMY    . ATRIAL FIBRILLATION ABLATION  05/11/2017  . ATRIAL FIBRILLATION ABLATION N/A 05/11/2017   Procedure: ATRIAL FIBRILLATION ABLATION;  Surgeon: [name], MD;  Location: [place] [facility];  Service: Cardiovascular;  Laterality: N/A;  . BACK SURGERY     "I've had 7 back and 1 neck ORs" (05/11/2017)  . CARDIAC CATHETERIZATION  2006  . CARPAL TUNNEL RELEASE Bilateral    LEFT  . CATARACT EXTRACTION W/ INTRAOCULAR LENS  IMPLANT, BILATERAL Bilateral   . CERVICAL SPINE SURGERY  06/02/2010   lower back and neck  . COLONOSCOPY WITH PROPOFOL N/A 12/29/2014   Procedure: COLONOSCOPY WITH PROPOFOL;  Surgeon: Carlos Fair, MD;  Location: [facility];  Service: Endoscopy;  Laterality: N/A;  . CORONARY ANGIOPLASTY WITH STENT PLACEMENT  October 2012  . CORONARY STENT PLACEMENT  Sept 2012   2nd OM with BMS  . HEMORROIDECTOMY    . LAMINECTOMY  05/30/2012   L 4 L5  . LAMINECTOMY WITH POSTERIOR LATERAL ARTHRODESIS LEVEL 3 N/A 10/18/2016   Procedure: Posterior Lateral Fusion - Lumbar One-Four, segmental instrumentation Lumbar One-Five,  decompression,;  Surgeon: Carlos Moore, MD;  Location: [facility] OR;  Service: Neurosurgery;  Laterality: N/A;  . LAPAROSCOPIC CHOLECYSTECTOMY    . LAPAROSCOPIC GASTRIC BANDING    . LEFT AND RIGHT HEART CATHETERIZATION WITH CORONARY ANGIOGRAM N/A 05/07/2014   Procedure: LEFT AND RIGHT HEART CATHETERIZATION WITH CORONARY ANGIOGRAM;  Surgeon: Peter Dawson Martinique, MD;  Location: [facility];  Service: Cardiovascular;  Laterality: N/A;  . LEFT HEART CATH AND CORONARY ANGIOGRAPHY N/A 03/13/2017   Procedure: LEFT HEART CATH AND CORONARY ANGIOGRAPHY;  Surgeon: Martinique, [name], MD;  Location: [place] [facility];  Service: Cardiovascular;  Laterality: N/A;  . LUMBAR LAMINECTOMY/DECOMPRESSION MICRODISCECTOMY N/A 05/04/2016   Procedure: Laminectomy and Foraminotomy - Thoracic twelve-Lumbar one -Posterior Fusion Lumbar one-two;  Surgeon: Carlos Moore, MD;  Location: [place];  Service: Neurosurgery;  Laterality: N/A;  . POSTERIOR LUMBAR FUSION  10/18/2016  . SHOULDER OPEN ROTATOR CUFF REPAIR Bilateral   . TONSILLECTOMY AND ADENOIDECTOMY    . TRIGGER FINGER RELEASE      LEFT  . UVULOPALATOPHARYNGOPLASTY    . VASECTOMY      Family History  Problem Relation Age of Onset  . Heart disease Mother   . Diabetes Mother   . Other Mother        stent placement  . Emphysema Father 55  . Heart attack Sister     Social History   Socioeconomic History  . Marital status: Married    Spouse name: Not on file  . Number of children: 3  . Years of education: Not on file  .  Highest education level: Not on file  Occupational History  . Occupation: Retired from Scientist, clinical (histocompatibility and immunogenetics): RETIRED  Social Needs  . Financial resource strain: Not on file  . Food insecurity:    Worry: Not on file    Inability: Not on file  . Transportation needs:    Medical: Not on file    Non-medical: Not on file  Tobacco Use  . Smoking status: Former Smoker    Packs/day: 1.50    Years: 30.00    Pack years: 45.00    Last attempt to quit: 01/24/1992    Years since quitting: 25.8  . Smokeless tobacco: Never Used  Substance and Sexual Activity  . Alcohol use: No  . Drug use: No  . Sexual activity: Not Currently  Lifestyle  . Physical activity:    Days per week: Not on file    Minutes per session: Not on file  . Stress: Not on file  Relationships  . Social connections:    Talks on phone: Not on file    Gets together: Not on file    Attends religious service: Not on file    Active member of club or organization: Not on file    Attends meetings of clubs or organizations: Not on file    Relationship status: Not on file  . Intimate partner violence:    Fear of current or ex partner: Not on file    Emotionally abused: Not on file    Physically abused: Not on file    Forced sexual activity: Not on file  Other Topics Concern  . Not on file  Social History Narrative  . Not on file    Social History   Tobacco Use  Smoking Status Former Smoker  . Packs/day: 1.50  . Years: 30.00  . Pack years: 45.00  . Last attempt to quit: 01/24/1992  . Years since quitting: 25.8    Smokeless Tobacco Never Used    Social History   Substance and Sexual Activity  Alcohol Use No     Allergies  Allergen Reactions  . Ace Inhibitors     Other reaction(s): Cough (ALLERGY/intolerance), Cough (ALLERGY/intolerance)  . Amoxicillin-Pot Clavulanate     Other reaction(s): GI Upset (intolerance)  . Quinolones     Patient was warned about not using Cipro and similar antibiotics. Recent studies have raised concern that fluoroquinolone antibiotics could be associated with an increased risk of aortic aneurysm Fluoroquinolones have non-antimicrobial properties that might jeopardise the integrity of the extracellular matrix of the vascular wall In a  propensity score matched cohort study in [place], there was a 66% increased rate of aortic aneurysm or dissection associated with oral fluoroquinolone use, compared wit  . Adhesive [Tape] Itching and Rash  . Latex Itching, Rash and Other (See Comments)    Bandaids  . Morphine Itching    Current Outpatient Medications  Medication Sig Dispense Refill  . acetaminophen (TYLENOL) 500 MG tablet Take 1,000 mg by mouth every 8 (eight) hours as needed for mild pain or headache.     . albuterol (PROVENTIL HFA;VENTOLIN HFA) 108 (90 Base) MCG/ACT inhaler Inhale 2 puffs into the lungs every 4 (four) hours as needed for wheezing or shortness of breath.    Marland Kitchen atorvastatin (LIPITOR) 10 MG tablet Take 5 mg by mouth daily at 6 PM.     . augmented betamethasone dipropionate (DIPROLENE-AF) 0.05 % cream Apply 1 application topically 2 (two) times daily as needed for itching.  3  .  budesonide-formoterol (SYMBICORT) 80-4.5 MCG/ACT inhaler Inhale 2 puffs into the lungs 2 (two) times daily as needed (for shortness of breath).     . celecoxib (CELEBREX) 200 MG capsule Take 200 mg by mouth 2 (two) times daily as needed for moderate pain.  0  . Cholecalciferol (VITAMIN D-3) 5000 units TABS Take 5,000 Units by mouth daily.    . furosemide (LASIX) 80 MG tablet  Take 80 mg by mouth 2 (two) times daily.    Marland Kitchen KLOR-CON M20 20 MEQ tablet TAKE 2 TABLETS BY MOUTH TWICE A DAY 360 tablet 1  . losartan (COZAAR) 25 MG tablet Take 25 mg by mouth daily.    . methocarbamol (ROBAXIN) 500 MG tablet Take 1 tablet (500 mg total) by mouth every 8 (eight) hours as needed for muscle spasms. 60 tablet 1  . metoprolol tartrate (LOPRESSOR) 25 MG tablet Take 12.5 mg by mouth 2 (two) times daily.    . montelukast (SINGULAIR) 10 MG tablet Take 10 mg by mouth at bedtime.    . nitroGLYCERIN (NITROSTAT) 0.4 MG SL tablet Place 1 tablet (0.4 mg total) under the tongue every 5 (five) minutes as needed for chest pain. 25 tablet 5  . omeprazole (PRILOSEC) 20 MG capsule Take 20 mg by mouth daily.    Vladimir Faster Glycol-Propyl Glycol (SYSTANE OP) Place 1 drop into both eyes daily as needed (for dry eyes).     . psyllium (METAMUCIL) 58.6 % packet Take 1 packet by mouth daily.    . rivaroxaban (XARELTO) 20 MG TABS tablet Take 1 tablet (20 mg total) by mouth daily with supper. 30 tablet   . sildenafil (VIAGRA) 100 MG tablet Take 100 mg by mouth as needed for erectile dysfunction.     . Skin Protectants, Misc. (EUCERIN) cream Apply 1 application topically as needed for dry skin.    Marland Kitchen spironolactone (ALDACTONE) 25 MG tablet Take 25 mg by mouth daily.    . tamsulosin (FLOMAX) 0.4 MG CAPS Take 0.4 mg by mouth every evening.     . topiramate (TOPAMAX) 25 MG capsule Take 50 mg by mouth 2 (two) times daily.     . traMADol (ULTRAM) 50 MG tablet Take 50 mg by mouth every 12 (twelve) hours as needed for moderate pain.      No current facility-administered medications for this visit.        Review of Systems:     Review of Systems:     Cardiac Review of Systems: Y or N  Chest Pain [  Y ]  Resting SOB [Y   ] Exertional SOB  [Y]  Orthopnea [Y  ]   Pedal Edema [ Y ]    Palpitations [ Y ] Syncope  [ N ]   Presyncope [ N  ]  General Review of Systems: [Y] = yes [  ]=no Constitional: recent weight  change [  ]; anorexia [  ]; fatigue [  ]; nausea [  ]; night sweats [  ]; fever [  ]; or chills [  ];  Dental: poor dentition[  ];   Eye : blurred vision [  ]; diplopia [   ]; vision changes [  ];  Amaurosis fugax[  ]; Resp: cough [  ];  wheezing[  ];  hemoptysis[  ]; shortness of breath[Y  ]; paroxysmal nocturnal dyspnea[Y  ]; dyspnea on exertion[Y ]; or orthopnea[  ];  GI:  gallstones[  ], vomiting[  ];  dysphagia[  ]; melena[  ];  hematochezia [  ]; heartburn[  ];   Hx of  Colonoscopy[  ]; GU: kidney stones [  ]; hematuria[  ];   dysuria [  ];  nocturia[  ];  history of     obstruction [  ];                 Skin: rash, swelling[  ];, hair loss[  ];  peripheral edema[  ];  or itching[  ]; Musculosketetal: myalgias[  ];  joint swelling[  ];  joint erythema[  ];  joint pain[  ];  back pain[  ];  Heme/Lymph: bruising[  ];  bleeding[  ];  anemia[  ];  Neuro: TIA[  ];  headaches[  ];  stroke[  ];  vertigo[  ];  seizures[  ];   paresthesias[  ];  difficulty walking[Y ];  Psych:depression[  ]; anxiety[  ];  Endocrine: diabetes[  ];  thyroid dysfunction[  ];  Immunizations: Flu [  Y]; Pneumococcal[Y  ];  Other:    Physical Exam: BP (!) 101/53   Pulse 60   Resp 20   Ht 5\' 6"  (1.676 Dawson)   Wt 270 lb (122.5 kg)   SpO2 95% Comment: RA  BMI 43.58 kg/Dawson   General appearance: alert and cooperative Head: Normocephalic, without obvious abnormality, atraumatic Neck: no adenopathy, no carotid bruit, no JVD, supple, symmetrical, trachea midline and thyroid not enlarged, symmetric, no tenderness/mass/nodules Lymph nodes: Cervical, supraclavicular, and axillary nodes normal. Resp: clear to auscultation bilaterally Back: symmetric, no curvature. ROM normal. No CVA tenderness. Cardio: regular rate and rhythm, S1, S2 normal, no murmur, click, rub or gallop GI: soft,  non-tender; bowel sounds normal; no masses,  no organomegaly Extremities: extremities normal, atraumatic, no cyanosis or edema and Homans sign is negative, no sign of DVT Neurologic: Grossly normal  Diagnostic Studies & Laboratory data:     Recent Radiology Findings:    Ct Angio Chest Aorta W &/or Wo Contrast  Result Date: 12/06/2017 CLINICAL DATA:  Thoracic aortic aneurysm follow-up EXAM: CT ANGIOGRAPHY CHEST WITH CONTRAST TECHNIQUE: Multidetector CT imaging of the chest was performed using the standard protocol during bolus administration of intravenous contrast. Multiplanar CT image reconstructions and MIPs were obtained to evaluate the vascular anatomy. CONTRAST:  83mL ISOVUE-370 IOPAMIDOL (ISOVUE-370) INJECTION 76% COMPARISON:  05/07/2017 FINDINGS: Cardiovascular: Maximal diameter of the ascending aorta is 4.9 cm. Previously, diameter was 5.0 cm. There is no evidence of aortic dissection or acute intramural hematoma. Mild LAD and circumflex coronary artery calcification. Mild atherosclerotic calcification of the aortic arch and descending thoracic aorta. Great vessels are patent. There is no obvious evidence of acute pulmonary thromboembolism. Mediastinum/Nodes: There is no abnormal mediastinal adenopathy. Visualized thyroid and esophagus are within normal limits. No pericardial effusion. Lungs/Pleura: No pneumothorax, pleural effusion, or pulmonary nodule. Upper Abdomen: Gastric banding apparatus is in place. Postcholecystectomy. Musculoskeletal: No vertebral compression deformity. Status post L2-3 decompression and posterior fusion. Review of the MIP images confirms the above findings. IMPRESSION: Stable aneurysmal dilatation of the ascending aorta at 4.9  cm. No evidence of dissection or intramural hematoma. Ascending thoracic aortic aneurysm. Recommend semi-annual imaging followup by CTA or MRA and referral to cardiothoracic surgery if not already obtained. This recommendation follows 2010  ACCF/AHA/AATS/ACR/ASA/SCA/SCAI/SIR/STS/SVM Guidelines for the Diagnosis and Management of Patients With Thoracic Aortic Disease. Circulation. 2010; 121: O175-Z025 Aortic Atherosclerosis (ICD10-I70.0). Electronically Signed   By: Marybelle Killings Dawson.D.   On: 12/06/2017 10:17   ADDENDUM REPORT: 05/08/2017 07:11  EXAM: OVER-READ INTERPRETATION  CT CHEST  The following report is an over-read performed by radiologist Dr. Collene Leyden [facility] on 05/08/2017. This over-read does not include interpretation of cardiac or coronary anatomy or pathology. The coronary CTA interpretation by the cardiologist is attached.  COMPARISON:  09/15/2016  FINDINGS: Aneurysmal dilatation of the ascending thoracic aorta, 5 cm maximally, stable since prior study. Mild cardiomegaly. No adenopathy in the lower mediastinum or hila. Areas of scarring in the lingula, right middle lobe and right lower lobe. No effusions.  Imaging into the upper abdomen shows no acute findings. Bilateral gynecomastia. No acute bony abnormality.  IMPRESSION: 5 cm ascending thoracic aortic aneurysm. This is stable since August 2018. Recommend semi-annual imaging followup by CTA or MRA and referral to cardiothoracic surgery if not already obtained. This recommendation follows 2010 ACCF/AHA/AATS/ACR/ASA/SCA/SCAI/SIR/STS/SVM Guidelines for the Diagnosis and Management of Patients With Thoracic Aortic Disease. Circulation. 2010; 121: E527-P824   Electronically Signed   By: Rolm Baptise Dawson.D.   On: 05/08/2017 07:11    CLINICAL DATA:  Chest pain x2 days, follow-up known thoracic aortic aneurysm  EXAM: CT ANGIOGRAPHY CHEST WITH CONTRAST  TECHNIQUE: Multidetector CT imaging of the chest was performed using the standard protocol during bolus administration of intravenous contrast. Multiplanar CT image reconstructions and MIPs were obtained to evaluate the vascular anatomy.  CONTRAST:  90 mL Isovue 370  IV  COMPARISON:  10/14/2015  FINDINGS: Cardiovascular: Preferential opacification of the thoracic aorta. No evidence of aortic dissection.  Stable ascending thoracic aortic aneurysm, measuring 5.0 cm (series 5/ image 53). Mild atherosclerotic calcifications.  Although not tailored for evaluation of the pulmonary arteries, there is no evidence of pulmonary embolism to the lobar level.  Heart is top-normal in size.  No pericardial effusion.  Mild coronary atherosclerosis of the LAD and left circumflex.  Mediastinum/Nodes: No suspicious mediastinal lymphadenopathy.  Visualized thyroid is unremarkable.  Lungs/Pleura: No suspicious pulmonary nodules.  No focal consolidation.  Mild dependent atelectasis in the bilateral lower lobes.  No pleural effusion or pneumothorax.  Upper Abdomen: Visualized upper abdomen is notable for mild hepatic steatosis and cholecystectomy clips.  Musculoskeletal: Degenerative changes of the visualized thoracolumbar spine.  Review of the MIP images confirms the above findings.  IMPRESSION:Stable 5.0 cm ascending thoracic aortic aneurysm. No evidence of aortic dissection.  Recommend semi-annual imaging followup by CTA or MRA and referral to cardiothoracic surgery if not already obtained. This recommendation follows 2010 ACCF/AHA/AATS/ACR/ASA/SCA/SCAI/SIR/STS/SVM Guidelines for the Diagnosis and Management of Patients With Thoracic Aortic Disease. Circulation. 2010; 121: M353-I144  No evidence of acute cardiopulmonary disease.  Aortic Atherosclerosis (ICD10-I70.0).   Electronically Signed   By: Julian Hy Dawson.D.   On: 09/15/2016 01:33     CLINICAL DATA:  Followup ascending aortic dilatation  EXAM: CT ANGIOGRAPHY CHEST WITH CONTRAST  TECHNIQUE: Multidetector CT imaging of the chest was performed using the standard protocol during bolus administration of intravenous contrast. Multiplanar CT image  reconstructions and MIPs were obtained to evaluate the vascular anatomy.  CONTRAST:  75 mL Isovue 370.  Creatinine was  obtained on site at [place] at [address]Results: Creatinine 1.2 mg/dL.  COMPARISON:  09/01/2014  FINDINGS: Cardiovascular: Mild aortic calcifications are noted. Dilatation of the ascending aorta is again identified to 5 cm. Measurements are somewhat limited by patient motion artifact. Sino-tubular junction measures 4.6 cm. The aorta at the level of sinus Valsalva measures 5.5 cm. These measurements are stable from the prior exam. Normal distal tapering is seen. No dissection is identified. Bovine arch is again identified.  Coronary calcifications are seen similar to that seen on the prior exam. The pulmonary artery as visualized is within normal limits.  Mediastinum/Nodes: Thoracic inlet is within normal limits. No significant hilar or mediastinal adenopathy is identified. No axillary adenopathy is seen. The esophagus as visualize is within normal limits.  Lungs/Pleura: Mild dependent atelectatic changes are noted. No focal infiltrate or focal nodules are seen. No sizable effusion or pneumothorax is noted.  Upper Abdomen: Gastric lap band is seen in satisfactory position. The remainder the upper abdomen is within normal limits.  Musculoskeletal: Degenerative changes of the thoracic spine are noted.  Review of the MIP images confirms the above findings.  IMPRESSION: Dilatation of the ascending aorta to 5 cm. Given some slight variation in the imaging technique, this is felt to be relatively stable from the prior exam. Ascending thoracic aortic aneurysm. Recommend semi-annual imaging followup by CTA or MRA and referral to cardiothoracic surgery if not already obtained. This recommendation follows 2010 ACCF/AHA/AATS/ACR/ASA/SCA/SCAI/SIR/STS/SVM Guidelines for the Diagnosis and Management of Patients With Thoracic  Aortic Disease. Circulation. 2010; 121: W258-N277  Otherwise stable appearance of the chest when compared with the prior exam.   Electronically Signed   By: Inez Catalina Dawson.D.   On: 10/14/2015 14:11  Ct Angio Chest Aorta W/cm &/or Wo/cm  10/01/2014   CLINICAL DATA:  Follow up thoracic aortic aneurysm. History of stent for blockage. Recently diagnosed prostate cancer.  EXAM: CT ANGIOGRAPHY CHEST WITH CONTRAST  TECHNIQUE: Multidetector CT imaging of the chest was performed using the standard protocol during bolus administration of intravenous contrast. Multiplanar CT image reconstructions and MIPs were obtained to evaluate the vascular anatomy.  CONTRAST:  75 cc Isovue 370.  COMPARISON:  09/11/2013  FINDINGS: There stable anulus mild dilation of the ascending aorta with maximum AP diameter of 4.8 cm. The diameter of the thoracic aorta at the arch is 4.4 cm. There is also a stable narrowing of the proximal descending aorta, downstream to the takeoff of the left subclavian artery, measuring 2 cm., as previously identified as possible area of coarctation. The remaining of the descending thoracic aorta is torturous but normal in caliber. There is bovine arch anatomic variation.  There is no evidence of pulmonary embolus.  The heart is normal in size. Conventional coronary artery anatomy is seen with atherosclerotic disease involving mostly the left system. There is no evidence of significant focal lung parenchymal consolidation, pleural effusion or pneumothorax. No masses are identified ; no abnormal focal contrast enhancement is seen.  No mediastinal or axillary lymphadenopathy is seen. The visualized portions of the thyroid gland are unremarkable in appearance.  The visualized portions of the liver and spleen are unremarkable.  The visualized portions of the pancreas, stomach, adrenal glands and kidneys are within normal limits. There has been a prior cholecystectomy.  No acute osseous abnormalities are  seen. Thoracic spine diffuse idiopathic skeletal hyperostosis is noted.  Review of the MIP images confirms the above findings.  IMPRESSION: Stable aneurysmal dilation of the ascending aorta  with maximum AP diameter of 4.8 cm. Stable area of narrowing of the proximal most portion of the descending aorta measuring 2 cm., previously identified as an area of coarctation. No evidence of aortic dissection.  Coronary artery disease.  Normal appearance of the lungs.   Electronically Signed   By: Fidela Salisbury Dawson.D.   On: 10/01/2014 08:50   Dg Chest 2 View  08/21/2013   CLINICAL DATA:  Followup of pneumonia. Productive cough. Congestion. Asthma. Ex-smoker.  EXAM: CHEST  2 VIEW  COMPARISON:  08/12/2013 and baseline radiograph of 05/21/2013  FINDINGS: Moderate thoracic spondylosis. Midline trachea. Moderate cardiomegaly. Pulmonary artery enlargement. Mediastinal contours otherwise within normal limits. No pleural effusion or pneumothorax. Lower lobe predominant interstitial thickening. Clearing of right lower lobe pneumonia.  IMPRESSION: Clearing of right lower lobe airspace disease/pneumonia.  Cardiomegaly with chronic interstitial thickening, likely related to smoking or chronic bronchitis.  Pulmonary artery enlargement suggests pulmonary arterial hypertension.   Electronically Signed   By: Abigail Miyamoto Dawson.D.   On: 08/21/2013 09:47   Ct Angio Chest Aorta W/cm &/or Wo/cm  09/11/2013   CLINICAL DATA:  Thoracic aortic aneurysm.  EXAM: CT ANGIOGRAPHY CHEST WITH CONTRAST  TECHNIQUE: Multidetector CT imaging of the chest was performed using the standard protocol during bolus administration of intravenous contrast. Multiplanar CT image reconstructions and MIPs were obtained to evaluate the vascular anatomy.  CONTRAST:  68mL OMNIPAQUE IOHEXOL 350 MG/ML SOLN  COMPARISON:  01/06/2013 and 09/14/2011  FINDINGS: Lungs are adequately inflated without consolidation effusion. There is minimal linear scarring over the anterior  right upper lobe unchanged. 4 mm nodular density adjacent the minor fissure unchanged from 2013. There is borderline cardiomegaly. Continued minimal calcification over the left main, lateral circumflex and anterior descending coronary arteries. No change in a right subcarinal lymph node measuring 1 cm by short axis.  There is continued evidence of aneurysmal dilatation of the ascending thoracic aorta which measures 4.8 cm in greatest AP diameter proximally which is unchanged. The thoracic aorta measures 4 cm in diameter at the level of the proximal arch without significant change. There is a relative area of narrowing of the posterior aortic arch 1.9 cm distal to the take-off of the left subclavian artery as the aorta measures 2.1 cm in diameter focally and increases in diameter to 3.2 cm immediately distal to this narrowing. Main pulmonary artery measures 4 cm in transverse diameter unchanged. Remaining mediastinal structures are unremarkable.  Images through the upper abdomen demonstrate evidence of prior cholecystectomy as well as surgical change over the region of the gastroesophageal junction with lap band apparatus in place. There are degenerative changes of the spine.  Review of the MIP images confirms the above findings.  IMPRESSION: Stable aneurysmal dilatation of the ascending thoracic aorta measuring 4.8 cm in AP diameter. Note that there is a focal relative area of narrowing of the aortic distal to the take-off of the left subclavian artery measuring 2.1 cm in diameter likely representing a degree of coarctation unchanged. No evidence of dissection.  Stable borderline cardiomegaly.  Postsurgical changes as described.   Electronically Signed   By: Marin Olp Dawson.D.   On: 09/11/2013 11:07    Recent Lab Findings: Lab Results  Component Value Date   WBC 6.5 04/30/2017   HGB 10.6 (L) 04/30/2017   HCT 33.4 (L) 04/30/2017   PLT 128 (L) 04/30/2017   GLUCOSE 154 (H) 04/30/2017   CHOL 118 01/07/2013    TRIG 133 01/07/2013   HDL 40 01/07/2013  LDLCALC 51 01/07/2013   ALT 19 03/15/2016   AST 25 03/15/2016   NA 143 04/30/2017   K 4.2 04/30/2017   CL 105 04/30/2017   CREATININE 1.54 (H) 04/30/2017   BUN 23 04/30/2017   CO2 24 04/30/2017   TSH 1.29 05/11/2015   INR 1.3 (H) 03/08/2017   HGBA1C 7.4 (H) 10/16/2016   Aortic Size Index=    5.0   /Body surface area is 2.39 meters squared. =2.12 < 2.75 cm/m2      4% risk per year 2.75 to 4.25          8% risk per year > 4.25 cm/m2    20% risk per year  Cardiac cath February 2019: Procedures   AORTIC ARCH ANGIOGRAPHY  LEFT HEART CATH AND CORONARY ANGIOGRAPHY  Conclusion     Previously placed Ost 2nd Mrg to 2nd Mrg stent (unknown type) is widely patent.  There is no aortic valve regurgitation.   1. No significant obstructive CAD 2. Aneurysmal thoracic aorta.   Plan: continue medical management . Consider pulmonary evaluation for symptoms of dyspnea.    Indications   Chest pain, unspecified type [R07.9 (ICD-10-CM)]  Procedural Details/Technique   Technical Details Indication: 77 yo WM S/p stenting of OM with BMS in 2012. Presents with atypical chest pain and dyspnea. Abnormal Myoview with inferior ischemia  Procedural Details: The left wrist was prepped, draped, and anesthetized with 1% lidocaine. Using the modified Seldinger technique, a 6 French slender sheath was introduced into the left radial artery. 3 mg of verapamil was administered through the sheath, weight-based unfractionated heparin was administered intravenously. Standard Judkins catheters were used for selective coronary angiography and aortography. Unable to cross the AV due to distortion of the aorta. A LA 1 catheter was used to engage the RCA. A JL6 catheter was used to engage the LCA. Catheter exchanges were performed over an exchange length guidewire. There were no immediate procedural complications. A TR band was used for radial hemostasis at the completion of  the procedure. The patient was transferred to the post catheterization recovery area for further monitoring.  Contrast: 97 cc   Estimated blood loss <50 mL.  During this procedure the patient was administered the following to achieve and maintain moderate conscious sedation: Versed 1 mg, Fentanyl 25 mcg, while the patient's heart rate, blood pressure, and oxygen saturation were continuously monitored. The period of conscious sedation was 24 minutes, of which I was present face-to-face 100% of this time.  Complications   Complications documented before study signed (03/13/2017 11:35 AM EST)    No complications were associated with this study.  Documented by Martinique, [name], MD - 03/13/2017 11:33 AM EST    Coronary Findings   Diagnostic  Dominance: Left  Left Main  Vessel was injected. Vessel is normal in caliber. Vessel is angiographically normal. The vessel originates from a separate ostium.  Left Anterior Descending  Vessel was injected. Vessel is small. Vessel is angiographically normal. There is mild focal disease in the vessel.  Second Septal Branch  Left Circumflex  Vessel was injected. Vessel is large. Vessel is angiographically normal.  Second Obtuse Marginal Branch  Ost 2nd Mrg to 2nd Mrg lesion 0% stenosed  Previously placed Ost 2nd Mrg to 2nd Mrg stent (unknown type) is widely patent.  Right Coronary Artery  Vessel was injected. Vessel is small. Vessel is angiographically normal.  Intervention   No interventions have been documented.  Left Heart   Aorta Aortic Root: The aortic  root is aneurysmal and is enlarged. There is no aortic valve regurgitation.  Coronary Diagrams   Diagnostic Diagram        Echocardiogram February 2019: Echocardiography  Patient:    Carlos Dawson, Carlos Dawson MR #:       [id number] Study Date: 03/07/2017 Gender:     Dawson Age:        72 Height:     170.2 cm Weight:     122.9 kg BSA:        2.47 Dawson^2 Pt. Status: Room:   SONOGRAPHER  Victorio Palm, RDCS  ATTENDING    Jory Sims Dawson  ORDERING     Jory Sims Dawson  REFERRING    Jory Sims Dawson  PERFORMING   [facility] Outpatient  cc:  ------------------------------------------------------------------- LV EF: 55% -   60%  ------------------------------------------------------------------- Indications:      (I25.10). GLS -21.5  ------------------------------------------------------------------- History:   PMH:  Acquired from the patient and from the patient&'s chart.  Chest pressure.  Dyspnea.  Paroxysmal atrial fibrillation. Coronary artery disease.  Congestive heart failure.  Aortic aneurysm.  Risk factors:  Hypertension. Diabetes mellitus. Morbidly obese. Dyslipidemia.  ------------------------------------------------------------------- Study Conclusions  - Left ventricle: The cavity size was normal. Wall thickness was   increased in a pattern of mild LVH. Systolic function was normal.   The estimated ejection fraction was in the range of 55% to 60%.   Wall motion was normal; there were no regional wall motion   abnormalities. Left ventricular diastolic function parameters   were normal. - Aortic valve: There was very mild stenosis. Valve area (VTI):   1.81 cm^2. Valve area (Vmax): 1.94 cm^2. Valve area (Vmean): 1.85   cm^2. - Right atrium: The atrium was mildly dilated. - Atrial septum: No defect or patent foramen ovale was identified. - Pulmonary arteries: PA peak pressure: 54 mm Hg (S).  ------------------------------------------------------------------- Labs, prior tests, procedures, and surgery: Echocardiography (April 2016).     EF was 55%.  Catheterization (2012).    The study demonstrated coronary artery disease. There was a stenosis in the 2nd obtuse marginal artery which was treated with a bare-metal stent. Cardioversion.  ------------------------------------------------------------------- Study data:  Comparison was made to the  study of 2016.  Study status:  Routine.  Procedure:  The patient reported no pain pre or post test. Transthoracic echocardiography for diagnosis, for left ventricular function evaluation, for right ventricular function evaluation, and for assessment of valvular function. Image quality was adequate.  Study completion:  There were no complications.     Echocardiography.  Dawson-mode, complete 2D, spectral Doppler, and color Doppler.  Birthdate:  Patient birthdate: 19-Sep-1940.  Age: Patient is 77 yr old.  Sex:  Gender: male.    BMI: 42.4 kg/Dawson^2. Blood pressure:     102/56  Patient status:  Outpatient.  Study date:  Study date: 03/07/2017. Study time: 08:39 AM.  Location: [facility]  -------------------------------------------------------------------  ------------------------------------------------------------------- Left ventricle:  The cavity size was normal. Wall thickness was increased in a pattern of mild LVH. Systolic function was normal. The estimated ejection fraction was in the range of 55% to 60%. Wall motion was normal; there were no regional wall motion abnormalities. The transmitral flow pattern was normal. The deceleration time of the early transmitral flow velocity was normal. The pulmonary vein flow pattern was normal. The tissue Doppler parameters were normal. Left ventricular diastolic function parameters were normal.  ------------------------------------------------------------------- Aortic valve:   Probably trileaflet; mildly thickened, mildly calcified leaflets. Mobility was  not restricted.  Doppler:   There was very mild stenosis.   There was no regurgitation.    VTI ratio of LVOT to aortic valve: 0.48. Valve area (VTI): 1.81 cm^2. Indexed valve area (VTI): 0.73 cm^2/Dawson^2. Peak velocity ratio of LVOT to aortic valve: 0.51. Valve area (Vmax): 1.94 cm^2. Indexed valve area (Vmax): 0.78 cm^2/Dawson^2. Mean velocity ratio of LVOT to aortic valve: 0.49. Valve area  (Vmean): 1.85 cm^2. Indexed valve area (Vmean): 0.75 cm^2/Dawson^2.    Mean gradient (S): 9 mm Hg. Peak gradient (S): 17 mm Hg.  ------------------------------------------------------------------- Aorta:  The aorta was moderately dilated. Aortic root: The aortic root was normal in size. Ascending aorta: The ascending aorta was mildly dilated.  ------------------------------------------------------------------- Mitral valve:   Mildly thickened leaflets . Mobility was not restricted.  Doppler:  Transvalvular velocity was within the normal range. There was no evidence for stenosis. There was trivial regurgitation.    Peak gradient (D): 3 mm Hg.  ------------------------------------------------------------------- Left atrium:  The atrium was normal in size.  ------------------------------------------------------------------- Atrial septum:  No defect or patent foramen ovale was identified.   ------------------------------------------------------------------- Right ventricle:  The cavity size was normal. Wall thickness was normal. Systolic function was normal.  ------------------------------------------------------------------- Pulmonic valve:    Doppler:  Transvalvular velocity was within the normal range. There was no evidence for stenosis. There was mild regurgitation.  ------------------------------------------------------------------- Tricuspid valve:   Structurally normal valve.    Doppler: Transvalvular velocity was within the normal range. There was mild regurgitation.  ------------------------------------------------------------------- Pulmonary artery:   The main pulmonary artery was normal-sized. Systolic pressure was within the normal range.  ------------------------------------------------------------------- Right atrium:  The atrium was mildly dilated.  ------------------------------------------------------------------- Pericardium:  The pericardium was normal  in appearance. There was no pericardial effusion.  ------------------------------------------------------------------- Systemic veins: Inferior vena cava: The vessel was dilated. The respirophasic diameter changes were blunted (< 50%), consistent with elevated central venous pressure.  ------------------------------------------------------------------- Post procedure conclusions Ascending Aorta:  - The aorta was moderately dilated.  ------------------------------------------------------------------- Measurements   Left ventricle                            Value          Reference  LV ID, ED, PLAX chordal                   50    mm       43 - 52  LV ID, ES, PLAX chordal                   37    mm       23 - 38  LV fx shortening, PLAX chordal    (L)     26    %        >=29  LV PW thickness, ED                       14    mm       ---------  IVS/LV PW ratio, ED                       0.93           <=1.3  Stroke volume, 2D                         93    ml       ---------  Stroke volume/bsa, 2D                     38    ml/Dawson^2   ---------  LV e&', lateral                            5.44  cm/s     ---------  LV E/e&', lateral                          14.61          ---------  LV e&', medial                             5.87  cm/s     ---------  LV E/e&', medial                           13.54          ---------  LV e&', average                            5.66  cm/s     ---------  LV E/e&', average                          14.06          ---------    Ventricular septum                        Value          Reference  IVS thickness, ED                         13    mm       ---------    LVOT                                      Value          Reference  LVOT ID, S                                22    mm       ---------  LVOT area                                 3.8   cm^2     ---------  LVOT ID                                   22    mm       ---------  LVOT peak velocity, S                      105   cm/s     ---------  LVOT mean velocity, S  66.1  cm/s     ---------  LVOT VTI, S                               24.4  cm       ---------  Stroke volume (SV), LVOT DP               92.8  ml       ---------  Stroke index (SV/bsa), LVOT DP            37.5  ml/Dawson^2   ---------    Aortic valve                              Value          Reference  Aortic valve peak velocity, S             206   cm/s     ---------  Aortic valve mean velocity, S             136   cm/s     ---------  Aortic valve VTI, S                       51.2  cm       ---------  Aortic mean gradient, S                   9     mm Hg    ---------  Aortic peak gradient, S                   17    mm Hg    ---------  VTI ratio, LVOT/AV                        0.48           ---------  Aortic valve area, VTI                    1.81  cm^2     ---------  Aortic valve area/bsa, VTI                0.73  cm^2/Dawson^2 ---------  Velocity ratio, peak, LVOT/AV             0.51           ---------  Aortic valve area, peak velocity          1.94  cm^2     ---------  Aortic valve area/bsa, peak               0.78  cm^2/Dawson^2 ---------  velocity  Velocity ratio, mean, LVOT/AV             0.49           ---------  Aortic valve area, mean velocity          1.85  cm^2     ---------  Aortic valve area/bsa, mean               0.75  cm^2/Dawson^2 ---------  velocity    Aorta                                     Value  Reference  Aortic root ID, ED                        42    mm       ---------  Ascending aorta ID, A-P, S                49    mm       ---------    Left atrium                               Value          Reference  LA ID, A-P, ES                            32    mm       ---------  LA ID/bsa, A-P                            1.29  cm/Dawson^2   <=2.2  LA volume, S                              25.9  ml       ---------  LA volume/bsa, S                          10.5  ml/Dawson^2   ---------  LA volume, ES, 1-p  A4C                    25.9  ml       ---------  LA volume/bsa, ES, 1-p A4C                10.5  ml/Dawson^2   ---------  LA volume, ES, 1-p A2C                    24.9  ml       ---------  LA volume/bsa, ES, 1-p A2C                10.1  ml/Dawson^2   ---------    Mitral valve                              Value          Reference  Mitral E-wave peak velocity               79.5  cm/s     ---------  Mitral A-wave peak velocity               105   cm/s     ---------  Mitral deceleration time                  229   ms       150 - 230  Mitral peak gradient, D                   3     mm Hg    ---------  Mitral E/A ratio, peak                    0.8            ---------  Pulmonary arteries                        Value          Reference  PA pressure, S, DP                (H)     54    mm Hg    <=30    Tricuspid valve                           Value          Reference  Tricuspid regurg peak velocity            338   cm/s     ---------  Tricuspid peak RV-RA gradient             46    mm Hg    ---------    Right atrium                              Value          Reference  RA ID, S-I, ES                            48    mm       ---------  RA ID, Dawson-L, ES, A4C                       44    mm       30 - 46    Systemic veins                            Value          Reference  Estimated CVP                             8     mm Hg    ---------    Right ventricle                           Value          Reference  RV ID, minor axis, ED, A4C base           46    mm       ---------  RV ID, minor axis, ED, A4C mid            26    mm       ---------  RV ID, major axis, ED, A4C                57    mm       55 - 91  RV pressure, S, DP                (H)     54    mm Hg    <=30  Legend: (L)  and  (H)  mark values outside specified reference range.  ------------------------------------------------------------------- Prepared and Electronically Authenticated by  Jenkins Rouge,  Dawson.D. 2019-02-13T11:50:02  Assessment / Plan:   Stable 4.9 cm ascending aortic dilatation.  Cardiac CT suggests a bileaflet  valve echocardiogram suggest a trileaflet valve No coronary disease of significance noted on cardiac cath February 2019 Patient remains limited by his shortness of breath, though some [facility] has improved his symptoms over the past 6 months  With the patient's overall poor medical condition I would not recommend elective repair at this point. We'll plan to see the patient back in 8  months with a followup CTA of the chest   Grace Isaac MD  [place] Office [phone] 12/06/2017 10:58 AM

## 2017-12-06 NOTE — Patient Instructions (Signed)

## 2017-12-13 DIAGNOSIS — M533 Sacrococcygeal disorders, not elsewhere classified: Secondary | ICD-10-CM | POA: Diagnosis not present

## 2017-12-17 DIAGNOSIS — N3941 Urge incontinence: Secondary | ICD-10-CM | POA: Diagnosis not present

## 2017-12-17 DIAGNOSIS — N528 Other male erectile dysfunction: Secondary | ICD-10-CM | POA: Diagnosis not present

## 2017-12-17 DIAGNOSIS — C61 Malignant neoplasm of prostate: Secondary | ICD-10-CM | POA: Diagnosis not present

## 2017-12-17 DIAGNOSIS — Z8546 Personal history of malignant neoplasm of prostate: Secondary | ICD-10-CM | POA: Diagnosis not present

## 2017-12-24 ENCOUNTER — Encounter (HOSPITAL_COMMUNITY): Payer: Self-pay

## 2018-01-09 ENCOUNTER — Telehealth: Payer: Self-pay

## 2018-01-09 DIAGNOSIS — I4891 Unspecified atrial fibrillation: Secondary | ICD-10-CM | POA: Diagnosis not present

## 2018-01-09 DIAGNOSIS — G4733 Obstructive sleep apnea (adult) (pediatric): Secondary | ICD-10-CM | POA: Diagnosis not present

## 2018-01-09 DIAGNOSIS — I10 Essential (primary) hypertension: Secondary | ICD-10-CM | POA: Diagnosis not present

## 2018-01-09 DIAGNOSIS — D509 Iron deficiency anemia, unspecified: Secondary | ICD-10-CM | POA: Diagnosis not present

## 2018-01-09 DIAGNOSIS — I2510 Atherosclerotic heart disease of native coronary artery without angina pectoris: Secondary | ICD-10-CM | POA: Diagnosis not present

## 2018-01-09 DIAGNOSIS — I5030 Unspecified diastolic (congestive) heart failure: Secondary | ICD-10-CM | POA: Diagnosis not present

## 2018-01-09 DIAGNOSIS — E119 Type 2 diabetes mellitus without complications: Secondary | ICD-10-CM | POA: Diagnosis not present

## 2018-01-09 DIAGNOSIS — H9190 Unspecified hearing loss, unspecified ear: Secondary | ICD-10-CM | POA: Diagnosis not present

## 2018-01-09 DIAGNOSIS — J45909 Unspecified asthma, uncomplicated: Secondary | ICD-10-CM | POA: Diagnosis not present

## 2018-01-09 DIAGNOSIS — I719 Aortic aneurysm of unspecified site, without rupture: Secondary | ICD-10-CM | POA: Diagnosis not present

## 2018-01-09 DIAGNOSIS — E785 Hyperlipidemia, unspecified: Secondary | ICD-10-CM | POA: Diagnosis not present

## 2018-01-09 NOTE — Telephone Encounter (Signed)
Received a call from patient.He stated he needs clearance from Dr.Jordan to have colonoscopy and endoscopy.Advised I will check with Dr.Jordan 12/19 and call you back.

## 2018-01-10 ENCOUNTER — Telehealth: Payer: Self-pay

## 2018-01-10 NOTE — Telephone Encounter (Signed)
Spoke to patient he stated he needed clearance from Dr.Jordan to have colonoscopy and endoscopy.I spoke to Dr.Jordan he advised ok may hold Xarelto 48 hours prior to procedures.Note faxed to Dr.Karki at fax # [phone].

## 2018-01-11 ENCOUNTER — Other Ambulatory Visit: Payer: Self-pay | Admitting: Cardiology

## 2018-01-29 DIAGNOSIS — I10 Essential (primary) hypertension: Secondary | ICD-10-CM | POA: Diagnosis not present

## 2018-01-29 DIAGNOSIS — M533 Sacrococcygeal disorders, not elsewhere classified: Secondary | ICD-10-CM | POA: Diagnosis not present

## 2018-01-29 DIAGNOSIS — M542 Cervicalgia: Secondary | ICD-10-CM | POA: Diagnosis not present

## 2018-01-29 DIAGNOSIS — M545 Low back pain: Secondary | ICD-10-CM | POA: Diagnosis not present

## 2018-01-29 DIAGNOSIS — Z6841 Body Mass Index (BMI) 40.0 and over, adult: Secondary | ICD-10-CM | POA: Diagnosis not present

## 2018-01-30 ENCOUNTER — Other Ambulatory Visit: Payer: Self-pay | Admitting: Cardiology

## 2018-01-31 NOTE — Telephone Encounter (Signed)
Rx has been sent to the pharmacy electronically. ° °

## 2018-02-05 ENCOUNTER — Other Ambulatory Visit: Payer: Self-pay | Admitting: Gastroenterology

## 2018-02-05 DIAGNOSIS — R042 Hemoptysis: Secondary | ICD-10-CM | POA: Diagnosis not present

## 2018-02-08 ENCOUNTER — Other Ambulatory Visit: Payer: Self-pay | Admitting: Gastroenterology

## 2018-02-08 DIAGNOSIS — R109 Unspecified abdominal pain: Secondary | ICD-10-CM

## 2018-02-08 DIAGNOSIS — R042 Hemoptysis: Secondary | ICD-10-CM | POA: Insufficient documentation

## 2018-02-14 ENCOUNTER — Ambulatory Visit
Admission: RE | Admit: 2018-02-14 | Discharge: 2018-02-14 | Disposition: A | Discharge status: Home / Self Care | Payer: Medicare Other | Source: Ambulatory Visit | Attending: Gastroenterology | Admitting: Gastroenterology

## 2018-02-14 DIAGNOSIS — R109 Unspecified abdominal pain: Secondary | ICD-10-CM

## 2018-02-14 MED ORDER — IOPAMIDOL (ISOVUE-300) INJECTION 61%
125.0000 mL | Freq: Once | INTRAVENOUS | Status: AC | PRN
  Administered 2018-02-14: 125 mL via INTRAVENOUS

## 2018-02-18 DIAGNOSIS — Z08 Encounter for follow-up examination after completed treatment for malignant neoplasm: Secondary | ICD-10-CM | POA: Diagnosis not present

## 2018-02-18 DIAGNOSIS — Z87891 Personal history of nicotine dependence: Secondary | ICD-10-CM | POA: Diagnosis not present

## 2018-02-18 DIAGNOSIS — R3912 Poor urinary stream: Secondary | ICD-10-CM | POA: Diagnosis not present

## 2018-02-18 DIAGNOSIS — Z923 Personal history of irradiation: Secondary | ICD-10-CM | POA: Diagnosis not present

## 2018-02-18 DIAGNOSIS — Z8546 Personal history of malignant neoplasm of prostate: Secondary | ICD-10-CM | POA: Diagnosis not present

## 2018-02-18 DIAGNOSIS — C61 Malignant neoplasm of prostate: Secondary | ICD-10-CM | POA: Diagnosis not present

## 2018-02-25 ENCOUNTER — Encounter: Payer: Self-pay | Admitting: Internal Medicine

## 2018-02-25 ENCOUNTER — Ambulatory Visit (INDEPENDENT_AMBULATORY_CARE_PROVIDER_SITE_OTHER): Payer: Medicare Other | Admitting: Internal Medicine

## 2018-02-25 VITALS — BP 116/68 | HR 60 | Ht 67.0 in | Wt 264.0 lb

## 2018-02-25 DIAGNOSIS — G4733 Obstructive sleep apnea (adult) (pediatric): Secondary | ICD-10-CM

## 2018-02-25 DIAGNOSIS — I480 Paroxysmal atrial fibrillation: Secondary | ICD-10-CM | POA: Diagnosis not present

## 2018-02-25 DIAGNOSIS — I10 Essential (primary) hypertension: Secondary | ICD-10-CM | POA: Diagnosis not present

## 2018-02-25 DIAGNOSIS — I5032 Chronic diastolic (congestive) heart failure: Secondary | ICD-10-CM

## 2018-02-25 DIAGNOSIS — R0600 Dyspnea, unspecified: Secondary | ICD-10-CM

## 2018-02-25 NOTE — Progress Notes (Signed)
PCP: Josetta Huddle, MD Primary Cardiologist: Dr Martinique Primary EP: Dr Rayann Heman  Carlos Dawson is a 78 y.o. male who presents today for routine electrophysiology followup.  Since last being seen in our clinic, the patient reports doing very well.  Today, he denies symptoms of palpitations, chest pain, shortness of breath,  lower extremity edema, dizziness, presyncope, or syncope.  The patient is otherwise without complaint today.   Past Medical History:  Diagnosis Date  . Anticoagulant long-term use   . Aortic root enlargement ([place])   . Ascending aortic aneurysm [facility])    recent scan in October 2012 showing no change; followed by Dr. Servando Snare  . ASCVD (arteriosclerotic cardiovascular disease)    Prior BMS to the 2nd OM in September 2012; with repeat cath in October showing patency  . CAD (coronary artery disease)    a. s/p BMS to 2nd OM in Sept 2012; b. LexiScan Myoview (12/2012):  Inf infarct; bowel and motion artifact make study difficult to interpret; no ischemia; not gated; Low Risk  . CHF (congestive heart failure) ([place])    no recent issues 10/13/14  . Chronic back pain    "all over my back" (05/11/2017)  . Colonic polyp   . Contact lens/glasses fitting   . Diastolic dysfunction   . DVT (deep venous thrombosis) ([place])    ?LLE  . Frequent headaches    "probably weekly" (05/11/2017)  . Generalized headaches    neck stenosis  . GERD (gastroesophageal reflux disease)   . Hearing loss   . Hearing loss    more so on left  . Hemorrhoids   . History of stomach ulcers   . Hypertension   . IBS (irritable bowel syndrome)   . LVH (left ventricular hypertrophy)   . Mild intermittent asthma   . OA (osteoarthritis)    "all over" (05/11/2017)  . Obesities, morbid ([place])   . OSA (obstructive sleep apnea)    PSG 03/30/97 AHI 21, BPAP 13/9  . OSA on CPAP   . PAF (paroxysmal atrial fibrillation) ([place])    a. on Xarelto b. s/p DCCV in 08/2016; b. Tikosyn failed 04/16/17 with plans for  Multaq and possible Afib ablation with Dr. Rayann Heman  . Pneumonia    'several times" (05/11/2017)  . Prostate CA [facility])    Oncologist  DR. Daralene Milch [facility] 09/24/14, undetermined tx   prostate; S/P "radiation and hormone injections"  . Pulmonary embolism ([place]) 2008   "both lungs"  . SOB (shortness of breath)    on excertion  . Thoracic aortic aneurysm ([facility])    Aortic Size Index=     5.0    /Body surface area is 2.43 meters squared. = 2.05  < 2.75 cm/m2      4% risk per year 2.75 to 4.25          8% risk per year > 4.25 cm/m2    20% risk per year   Stable aneurysmal dilation of the ascending aorta with maximum AP diameter of 4.8 cm. Stable area of narrowing of the proximal most portion of the descending aorta measuring 2 cm., previously identified as an area of coarctation. No evidence of aortic dissection.  Coronary artery disease.  Normal appearance of the lungs.   Electronically Signed   By: Fidela Salisbury M.D.   On: 10/01/2014 08:50    . Type II diabetes mellitus ([place])    metphormin, average 154 dx 2017   Past Surgical History:  Procedure Laterality Date  .  ACHILLES TENDON REPAIR Bilateral   . AORTIC ARCH ANGIOGRAPHY N/A 03/13/2017   Procedure: AORTIC ARCH ANGIOGRAPHY;  Surgeon: Martinique, [name], MD;  Location: [place] CV LAB;  Service: Cardiovascular;  Laterality: N/A;  . APPENDECTOMY    . ATRIAL FIBRILLATION ABLATION  05/11/2017  . ATRIAL FIBRILLATION ABLATION N/A 05/11/2017   Procedure: ATRIAL FIBRILLATION ABLATION;  Surgeon: [name], MD;  Location: [place] CV LAB;  Service: Cardiovascular;  Laterality: N/A;  . BACK SURGERY     "I've had 7 back and 1 neck ORs" (05/11/2017)  . CARDIAC CATHETERIZATION  2006  . CARPAL TUNNEL RELEASE Bilateral    LEFT  . CATARACT EXTRACTION W/ INTRAOCULAR LENS  IMPLANT, BILATERAL Bilateral   . CERVICAL SPINE SURGERY  06/02/2010   lower back and neck  . COLONOSCOPY WITH PROPOFOL N/A 12/29/2014   Procedure: COLONOSCOPY WITH PROPOFOL;  Surgeon:  Garlan Fair, MD;  Location: [facility] ENDOSCOPY;  Service: Endoscopy;  Laterality: N/A;  . CORONARY ANGIOPLASTY WITH STENT PLACEMENT  October 2012  . CORONARY STENT PLACEMENT  Sept 2012   2nd OM with BMS  . HEMORROIDECTOMY    . LAMINECTOMY  05/30/2012   L 4 L5  . LAMINECTOMY WITH POSTERIOR LATERAL ARTHRODESIS LEVEL 3 N/A 10/18/2016   Procedure: Posterior Lateral Fusion - Lumbar One-Four, segmental instrumentation Lumbar One-Five,  decompression,;  Surgeon: Eustace Moore, MD;  Location: [facility] OR;  Service: Neurosurgery;  Laterality: N/A;  . LAPAROSCOPIC CHOLECYSTECTOMY    . LAPAROSCOPIC GASTRIC BANDING    . LEFT AND RIGHT HEART CATHETERIZATION WITH CORONARY ANGIOGRAM N/A 05/07/2014   Procedure: LEFT AND RIGHT HEART CATHETERIZATION WITH CORONARY ANGIOGRAM;  Surgeon: [name] Martinique, MD;  Location: [facility] CATH LAB;  Service: Cardiovascular;  Laterality: N/A;  . LEFT HEART CATH AND CORONARY ANGIOGRAPHY N/A 03/13/2017   Procedure: LEFT HEART CATH AND CORONARY ANGIOGRAPHY;  Surgeon: Martinique, [name], MD;  Location: [place] CV LAB;  Service: Cardiovascular;  Laterality: N/A;  . LUMBAR LAMINECTOMY/DECOMPRESSION MICRODISCECTOMY N/A 05/04/2016   Procedure: Laminectomy and Foraminotomy - Thoracic twelve-Lumbar one -Posterior Fusion Lumbar one-two;  Surgeon: Eustace Moore, MD;  Location: [place];  Service: Neurosurgery;  Laterality: N/A;  . POSTERIOR LUMBAR FUSION  10/18/2016  . SHOULDER OPEN ROTATOR CUFF REPAIR Bilateral   . TONSILLECTOMY AND ADENOIDECTOMY    . TRIGGER FINGER RELEASE     LEFT  . UVULOPALATOPHARYNGOPLASTY    . VASECTOMY      ROS- all systems are reviewed and negatives except as per HPI above  Current Outpatient Medications  Medication Sig Dispense Refill  . acetaminophen (TYLENOL) 500 MG tablet Take 1,000 mg by mouth every 8 (eight) hours as needed for mild pain or headache.     . albuterol (PROVENTIL HFA;VENTOLIN HFA) 108 (90 Base) MCG/ACT inhaler Inhale 2 puffs into the lungs every 4 (four)  hours as needed for wheezing or shortness of breath.    Marland Kitchen atorvastatin (LIPITOR) 10 MG tablet Take 5 mg by mouth daily at 6 PM.     . augmented betamethasone dipropionate (DIPROLENE-AF) 0.05 % cream Apply 1 application topically 2 (two) times daily as needed for itching.  3  . budesonide-formoterol (SYMBICORT) 80-4.5 MCG/ACT inhaler Inhale 2 puffs into the lungs 2 (two) times daily as needed (for shortness of breath).     . celecoxib (CELEBREX) 200 MG capsule Take 200 mg by mouth 2 (two) times daily as needed for moderate pain.  0  . Cholecalciferol (VITAMIN D-3) 5000 units TABS Take 5,000  Units by mouth daily.    . furosemide (LASIX) 80 MG tablet Take 80 mg by mouth 2 (two) times daily.    Marland Kitchen KLOR-CON M20 20 MEQ tablet TAKE 2 TABLETS BY MOUTH TWICE A DAY 360 tablet 2  . losartan (COZAAR) 25 MG tablet TAKE 1 TABLET BY MOUTH EVERY DAY 90 tablet 0  . methocarbamol (ROBAXIN) 500 MG tablet Take 1 tablet (500 mg total) by mouth every 8 (eight) hours as needed for muscle spasms. 60 tablet 1  . metoprolol tartrate (LOPRESSOR) 25 MG tablet Take 12.5 mg by mouth 2 (two) times daily.    . montelukast (SINGULAIR) 10 MG tablet Take 10 mg by mouth at bedtime.    . nitroGLYCERIN (NITROSTAT) 0.4 MG SL tablet Place 1 tablet (0.4 mg total) under the tongue every 5 (five) minutes as needed for chest pain. 25 tablet 5  . omeprazole (PRILOSEC) 20 MG capsule Take 20 mg by mouth daily.    Vladimir Faster Glycol-Propyl Glycol (SYSTANE OP) Place 1 drop into both eyes daily as needed (for dry eyes).     . psyllium (METAMUCIL) 58.6 % packet Take 1 packet by mouth daily.    . rivaroxaban (XARELTO) 20 MG TABS tablet Take 1 tablet (20 mg total) by mouth daily with supper. 30 tablet   . sildenafil (VIAGRA) 100 MG tablet Take 100 mg by mouth as needed for erectile dysfunction.     . Skin Protectants, Misc. (EUCERIN) cream Apply 1 application topically as needed for dry skin.    Marland Kitchen spironolactone (ALDACTONE) 25 MG tablet Take 25 mg by  mouth daily.    . tamsulosin (FLOMAX) 0.4 MG CAPS Take 0.4 mg by mouth every evening.     . topiramate (TOPAMAX) 25 MG capsule Take 50 mg by mouth 2 (two) times daily.     . traMADol (ULTRAM) 50 MG tablet Take 50 mg by mouth every 12 (twelve) hours as needed for moderate pain.      No current facility-administered medications for this visit.     Physical Exam: Vitals:   02/25/18 0952  BP: 116/68  Pulse: 60  SpO2: 99%  Weight: 264 lb (119.7 kg)  Height: 5\' 7"  (1.702 m)    GEN- The patient is well appearing, alert and oriented x 3 today.   Head- normocephalic, atraumatic Eyes-  Sclera clear, conjunctiva pink Ears- hearing intact Oropharynx- clear Lungs- Clear to ausculation bilaterally, normal work of breathing Heart- Regular rate and rhythm, no murmurs, rubs or gallops, PMI not laterally displaced GI- soft, NT, ND, + BS Extremities- no clubbing, cyanosis, or edema  Wt Readings from Last 3 Encounters:  02/25/18 264 lb (119.7 kg)  12/06/17 270 lb (122.5 kg)  11/23/17 270 lb (122.5 kg)    EKG tracing ordered today is personally reviewed and shows sinus rhythm with PACs  Assessment and Plan:  1. Persistent afib/ atrial flutter Continues to do very well post ablation off AAD therapy chads2vasc sore is 4.  On xarelto  2. Chronic diastolic dysfunction Stable No change required today  3. HTN Stable No change required today  4. OSA Compliant with CPAP  5. Obesity Body mass index is 41.35 kg/m. Lifestyle modification again encouraged He was very active in the [facility] program but since it ended in October, has not been exercising.  He has a Forensic scientist.  6. SOB multifactoral CPX reveals due to obesity and deconditioning He is exercising  AF clinic in 4 months  [name] MD, [facility] 02/25/2018  10:03 AM

## 2018-02-25 NOTE — Patient Instructions (Addendum)
Medication Instructions:  Your physician recommends that you continue on your current medications as directed. Please refer to the Current Medication list given to you today.  Labwork: None ordered.  Testing/Procedures: None ordered.  Follow-Up: Your physician wants you to follow-up in: 4 months with AFIB clinic.      Any Other Special Instructions Will Be Listed Below (If Applicable).  If you need a refill on your cardiac medications before your next appointment, please call your pharmacy.

## 2018-03-07 DIAGNOSIS — Z23 Encounter for immunization: Secondary | ICD-10-CM | POA: Diagnosis not present

## 2018-03-07 DIAGNOSIS — L738 Other specified follicular disorders: Secondary | ICD-10-CM | POA: Diagnosis not present

## 2018-03-07 DIAGNOSIS — L570 Actinic keratosis: Secondary | ICD-10-CM | POA: Diagnosis not present

## 2018-03-12 ENCOUNTER — Other Ambulatory Visit: Payer: Self-pay

## 2018-03-12 ENCOUNTER — Encounter (HOSPITAL_COMMUNITY): Payer: Self-pay | Admitting: Emergency Medicine

## 2018-03-13 ENCOUNTER — Other Ambulatory Visit: Payer: Self-pay | Admitting: Cardiology

## 2018-03-13 MED ORDER — FUROSEMIDE 80 MG PO TABS: 80.0000 mg | ORAL_TABLET | Freq: Two times a day (BID) | ORAL | 3 refills | Status: DC

## 2018-03-13 NOTE — Telephone Encounter (Signed)
Rx(s) sent to pharmacy electronically.  

## 2018-03-13 NOTE — H&P (Signed)
History of Present Illness  General:  76/male was referred for IDA< iron saturation of 17% and elevated TIBC with Hb aournd 10 and low MCV of 79. Iron panel from 2/19 shoewd iron saturation of 8% and TIBC was 544. Colonoscopy from 2017 was reported as normal but prep was fair and a repeat colonoscopy was recommended in 3 yers. In 2016 tubular adenomas were removed from ascending colon and descending colon. In 2008 adenoma was removed from cecum In 2003 adenoma was removed from cecum. In 2004 he has H pylori gatritis noted on EGD. Patient reports being in his usual state of health until the last few months when he has noticed progressively worsening shortness of breath on exertion, fatigue and lack of energy. His labs from June 2018 show a normal hemoglobin of 12.4 but recent labs revealed anemia with a hemoglobin around 10. Patient states he has had anal fissures and may see bright red blood minimal in amount on wiping, almost on a daily basis for the last 4 years. He denies black stools or vomiting blood. He denies hemetemesis, gum bleeds, bruises or hematuria. He had lap banding performed several years ago and it is still inflated and he does have difficulty swallowing solids more than liquids. Denies recent weight loss or loss of appetite, denies acid reflux.   Current Medications  Taking   Spironolactone 25 MG Tablet 1 tablet Orally Once a day   Furosemide 40 MG Tablet 2 tablets Orally twice a day   K-Dur 20 meq Tablet 2 tablets Orally twice a day   Topiramate 25 MG Tablet 2 tablets Orally Twice a day   Tamsulosin HCl 0.4 MG Capsule 1 capsule 30 minutes after the same meal each day Orally Once a day   Vitamin D3 Ultra Strength(Cholecalciferol) 5000 UNIT Capsule 1 capsule Orally Once a day   Xarelto 20 mg . tablet 1 tablet orally once a day   Nitroglycerin 0.4 mg 0.4 mg tablet 1 tablet as directed SL as directed prn chest pain   Atorvastatin Calcium 20 MG Tablet 2 tablets Orally three  times a week   Eucerin - Cream Externally   Tramadol HCl 50 MG Tablet 1 tablet as needed for moderate pain Orally every 12 hours   Losartan Potassium 25 MG Tablet 1 tablet Orally Once a day   Singulair(Montelukast Sodium) 10 MG Tablet 1 tablet in the evening Orally Once a day   Methocarbamol 500 MG Tablet 1 tablet Orally every 8 hrs as needed for muscle spasms   Albuterol Sulfate HFA 108 (90 Base) MCG/ACT Aerosol Solution 2 puffs as needed Inhalation every 6 hrs   Symbicort(Budesonide-Formoterol Fumarate) 160-4.5 MCG/ACT Aerosol 2 puffs Inhalation Twice a day   Systane(Polyethyl Glycol-Propyl Glycol) 0.4-0.3 % Solution 1 drop into affected eye as needed Ophthalmic 24 time(s) a day   Metamucil(Psyllium) 48.57 % Powder Orally   Sildenafil Citrate 100 MG Tablet 1 tablet as needed Orally Once a day   Metoprolol Tartrate 25 MG Tablet 1 tablet with food Orally Twice a day   Ferrous Gluconate 324 (38 Fe) MG Tablet 1 tablet Orally twice a day   Not-Taking   Atenolol 25 MG Tablet 1 tablet Orally Once a day   Medication List reviewed and reconciled with the patient    Past Medical History  Hypertension.   GERD.   Low back pain.   Obstructive sleep apnea - on BiPAP- 13/9 - seasonal asthma - Dr. Halford Chessman - on repeat 09/2013 - severe - sleep titration  study December 28, 2013. CPAP of 10 recommended - chronic respiratory failure, last OV 06/2016.   Insomnia.   BPH and elevated PSA - history of prostate cancer dx'd 09/2014 - Radiation tx 12/2014-01/2015 - Dr. Lawerance Bach biopsies/ Dr. Tharon Aquas - [facility] - Rad [place].   CAD - non obstructive 50% LAD - Dr. Peter Martinique - 09/2014 - 11/2016 - last OV 02/2017.   Panic disorder.   history of bilateral pulmonary embolism February, 2008.   2-3 mm colon polyp, October 31, 2001 - Dr. Earle Gell.   cervical DJD, followed by Dr. Leeroy Cha, May, 2011.   ascending aortic aneurysm - 5.0 cm - Dr. Lanelle Bal - stable, 11/16/2016 - follow q 6 months.   bilateral  cataracts - Dr. Katy Fitch.   paroxysmal atrial fibrillation and diastolic congestive heart failure - Dr. Peter Martinique - [address] [place], [place], last OV - 09/22/2016.   recurrent left lower extremity radicular pain, 02/2012 - Dr. Sherley Bounds.   Hypokalemia.   hospitalized for chest pain, December, 2014.   2.9 x 2 cm mass in jejunal mesentery felt to be a lymph node, January 29, 2013 on PET scanning - slightly larger than 05-06-2010 - may need followup with PET scan or CT scan.   benign positional vertigo and atrial fibrillation, Dr.l [facility] - March, 7829.   Mild pulmonary hypertension - 05-05-13.   living will, July, 2016.   cervical thoracic and lumbar DJD, Dr. Leeroy Cha - Pain 2/10 at April, 2017 OV.Marland Kitchen   asthma - Dr. Halford Chessman - 04/22/2015 - 09/2015 - 05-May-2016 - [facility] 04/2016 - Carolynn Serve, NP.   left trigger finger and thumb and bilat CTS - Dr. Laurelyn Sickle - 09/2015.   postlaminectomy syndrome, Dr. Vonzella Nipple, May 05, 2016.   persistent lumbar radiculopathy, July, 2018, Dr. Sherley Bounds, recommended lumbar myelogram - ? planned L1-L5 fusion, September, 2018.   chest pain, August, 2018, admission to [facility].    Surgical History  Lap Band Surgery - Dr. Johnathan Hausen 05/06/2007  appy   cholecystectomy   uvulectomy - Dr. Ernesto Rutherford   3 back surgeries - Dr. Joya Salm   cervical spurring - Dr. Joya Salm   vasectomy   Achilles tendon surgery   bilateral elbow surgery   rotator cuff surgery, bilateral   BMS to om2 - 09/2010 - Dr. Peter Martinique   colonoscopy with polyp resection   lumbar surgery with laminectomy, L4-5 - Dr. Sherley Bounds and Dr. Erline Levine 05/2012  T12-L2 fusion, Dr. Sherley Bounds 05/04/2016  L2-3 lumbar laminectomy and hemifacetectomy, L2-3 with posterior fixation L1-L5 - Dr. Sherley Bounds 09/2016  angiogram 02/2017   Family History  Father: deceased, COPD/Brown Lung died at 53, diagnosed with COPD (chronic obstructive pulmonary disease)  Mother: deceased, 32  in 2009-05-05  Brother 1: deceased, 73 years old died with bladder cancer  Sister 1: alive, 67 years old - cancer -ok. lower extremity crippled at birth  Sister 2: deceased, 49 yo died of MI  sister had colon cancer.   Social History  General:  Tobacco use  cigarettes: Former smoker Quit in year 1992/05/05 Pack-year Hx: 25 Additional Findings: Tobacco Non-User Ex-moderate cigarette smoker (10-19/day) Tobacco history last updated 03/15/2017 no EXPOSURE TO PASSIVE SMOKE.  no Alcohol.  Caffeine: yes.  no Recreational drug use.  Marital Status: married.  Children: 3 boys.  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    Allergies  Morphine Sulfate: itching - Allergy  lotensin: cough -  Side Effects  ace inhibitors: cough - Side Effects  augmentin: GI upset - Side Effects  procardia: leg edema - Side Effects  imiprimine: wt gain - Side Effects  codeine: nausea   Hospitalization/Major Diagnostic Procedure  ER visit for back pain. Treated medically 02/2016  childbirth    Review of Systems  CONSTITUTIONAL:  Chills No. Fatigue YES. Fever No. Insomnia No. Night sweats No. Weight change No.  HEENT:  Change in vision No. Double vision No . Hoarseness No. Nose bleeds No. sore throat YES. Sinus Problems No. Glaucoma No.  CARDIOLOGY:  ByPass Surgery No. Poor Circulation No. Artificial Heart Valves No. High blood pressure YES. History of Heart attack No. Irregular heart beat YES. Known coronary artery disease YES. Pacemaker/Defibrillator No.  RESPIRATORY:  Shortness of breath YES. Cough No. Excessive Sputum No. Using Oxygen No. Asthma YES. Bronchitis No. Pneumonia No. Sleep Apnea YES.  UROLOGY:  Interstitials Cystitis No. Incontinence No. Prostate problems No. Blood in urine No. Difficulty urinating No. Kidney disease No. Kidney stones No.  GASTROENTEROLOGY:  Abdominal pain YES. Acid reflux No. Black stools No. Bloating/belching YES. Change in bowel habits No. Constipation No. Dark tarry  stools No. Diarrhea No. Difficulty swallowing YES. Heartburn No. Incontinence of stool No. Indigestion: No. Lactose intolerance No. Nausea No. Rectal bleeding No. Vomiting No. Hepatitis/yellow jaundice No. History of Ulcers No. Use of Pain Medication No. Previous Colonoscopy No.  MUSCULOSKELETAL:  joint pain No. Arthritis YES. Joint Replacement No.  NEUROLOGY:  Dizziness YES. Fainting No. Headache YES. Paralysis No. Seizures No. Strokes No. Weakness No. Alzheimer's No.  SKIN:  Rash No. Bruises easily No.  ENDOCRINOLOGY:  Diabetes YES. High cholesterol No. Thyroid disorder No.  HEMATOLOGY/LYMPH:  Abnormal bleeding No. Anemia YES. Enlarged lymph nodes No. Past blood transfusion No. Swollen glands No. Blood Clots YES. Using Blood Thinners YES.  INFECTIOUS DISEASE:  HIV/AIDS No. Tuberculosis No . Hepatitis No. Sexually Transmitted Disease No. MRSA No.  GI PROCEDURE:  no Pacemaker/ AICD, no. no Artificial heart valves. no MI/heart attack. Abnormal heart rhythm YES. Angina YES. no CVA. Hypertension YES. no Hypotension. Asthma, COPD YES, asthma. Sleep apnea YES. no Seizure disorders. no Artificial joints. no Severe DJD. Diabetes YES, YES, type II. Significant headaches YES. Vertigo YES. no Depression/anxiety. Abnormal bleeding YES. no Kidney Disease. no Liver disease, no. no Blood transfusion.     Vital Signs  Wt 276.0, Wt change 8.6 lb, Ht 67, BMI 43.22, Temp 96.6, Pulse sitting 66, BP sitting 113/43.   Examination  Gastroenterology:: GENERAL APPEARANCE: Well developed, obese, no active distress, pleasant, no acute distress.  EYES: prominent pallor.  ORAL CAVITY: Lips, teeth and gums are normal. Pharynx, tongue, mucosa normal .  SCLERA: anicteric .  NECK Full ROM, trachea midline, no thyromegaly or masses .  CARDIOVASCULAR Irregular rhythm, no murmers or gallops. No peripheral edema .  RESPIRATORY Breath sounds normal. Respiration even and unlabored .  ABDOMEN No masses palpated. Liver and  spleen not palpated, normal. Bowel sounds normal, Abdomen not distended .  EXTREMITIES: No edema, pulses intact .  NEURO: normal strength and reflexes, cranial nerves II-XII grossly intact, normal gait .  PSYCH: mood/affect normal .     Assessments   1. Iron deficiency anemia, unspecified iron deficiency anemia type - D50.9 (Primary)   Treatment  1. Iron deficiency anemia, unspecified iron deficiency anemia type  IMAGING: Colonoscopy    Whitfield,Dia 04/05/2017 04:53:13 PM > scheduled for 05/04/17-[facility]-8am-prep instructions reviewed with pt.  IMAGING: Esophagoscopy    Maurie Boettcher  04/05/2017 04:53:13 PM > scheduled for 05/04/17-[facility]-8am-prep instructions reviewed with pt.   Notes: Will need to hold Xarelto for at least 3 days, will get an approval from his cardiologist Dr.Peter Martinique. If EGD(including SB biopsies) and colonoscopy are unrevealing will need a small bowel pillcam. ddx: PUD, AVMs, erosions, less likely malignancy, celiac disease causing decreased iron absorption.  Referral To: Reason:endo-colon-propofol-spoke with [phone]

## 2018-03-14 ENCOUNTER — Ambulatory Visit (HOSPITAL_COMMUNITY): Payer: Medicare Other | Admitting: Certified Registered Nurse Anesthetist

## 2018-03-14 ENCOUNTER — Encounter (HOSPITAL_COMMUNITY): Payer: Self-pay | Admitting: *Deleted

## 2018-03-14 ENCOUNTER — Other Ambulatory Visit: Payer: Self-pay

## 2018-03-14 ENCOUNTER — Encounter (HOSPITAL_COMMUNITY)
Admission: RE | Disposition: A | Discharge status: Home / Self Care | Payer: Self-pay | Source: Home / Self Care | Attending: Gastroenterology

## 2018-03-14 ENCOUNTER — Ambulatory Visit (HOSPITAL_COMMUNITY)
Admission: RE | Admit: 2018-03-14 | Discharge: 2018-03-14 | Disposition: A | Discharge status: Home / Self Care | Payer: Medicare Other | Attending: Gastroenterology | Admitting: Gastroenterology

## 2018-03-14 DIAGNOSIS — D509 Iron deficiency anemia, unspecified: Secondary | ICD-10-CM | POA: Insufficient documentation

## 2018-03-14 DIAGNOSIS — Z79899 Other long term (current) drug therapy: Secondary | ICD-10-CM | POA: Insufficient documentation

## 2018-03-14 DIAGNOSIS — I5030 Unspecified diastolic (congestive) heart failure: Secondary | ICD-10-CM | POA: Insufficient documentation

## 2018-03-14 DIAGNOSIS — E1151 Type 2 diabetes mellitus with diabetic peripheral angiopathy without gangrene: Secondary | ICD-10-CM | POA: Insufficient documentation

## 2018-03-14 DIAGNOSIS — Z880 Allergy status to penicillin: Secondary | ICD-10-CM | POA: Insufficient documentation

## 2018-03-14 DIAGNOSIS — Z888 Allergy status to other drugs, medicaments and biological substances status: Secondary | ICD-10-CM | POA: Insufficient documentation

## 2018-03-14 DIAGNOSIS — D175 Benign lipomatous neoplasm of intra-abdominal organs: Secondary | ICD-10-CM | POA: Diagnosis not present

## 2018-03-14 DIAGNOSIS — I110 Hypertensive heart disease with heart failure: Secondary | ICD-10-CM | POA: Insufficient documentation

## 2018-03-14 DIAGNOSIS — Z8546 Personal history of malignant neoplasm of prostate: Secondary | ICD-10-CM | POA: Insufficient documentation

## 2018-03-14 DIAGNOSIS — J4520 Mild intermittent asthma, uncomplicated: Secondary | ICD-10-CM | POA: Insufficient documentation

## 2018-03-14 DIAGNOSIS — Z86711 Personal history of pulmonary embolism: Secondary | ICD-10-CM | POA: Insufficient documentation

## 2018-03-14 DIAGNOSIS — I2510 Atherosclerotic heart disease of native coronary artery without angina pectoris: Secondary | ICD-10-CM | POA: Insufficient documentation

## 2018-03-14 DIAGNOSIS — I480 Paroxysmal atrial fibrillation: Secondary | ICD-10-CM | POA: Diagnosis not present

## 2018-03-14 DIAGNOSIS — K219 Gastro-esophageal reflux disease without esophagitis: Secondary | ICD-10-CM | POA: Diagnosis not present

## 2018-03-14 DIAGNOSIS — Z7951 Long term (current) use of inhaled steroids: Secondary | ICD-10-CM | POA: Diagnosis not present

## 2018-03-14 DIAGNOSIS — Z9884 Bariatric surgery status: Secondary | ICD-10-CM | POA: Insufficient documentation

## 2018-03-14 DIAGNOSIS — G4733 Obstructive sleep apnea (adult) (pediatric): Secondary | ICD-10-CM | POA: Diagnosis not present

## 2018-03-14 DIAGNOSIS — Z87891 Personal history of nicotine dependence: Secondary | ICD-10-CM | POA: Diagnosis not present

## 2018-03-14 DIAGNOSIS — K648 Other hemorrhoids: Secondary | ICD-10-CM | POA: Insufficient documentation

## 2018-03-14 DIAGNOSIS — Z885 Allergy status to narcotic agent status: Secondary | ICD-10-CM | POA: Insufficient documentation

## 2018-03-14 DIAGNOSIS — I712 Thoracic aortic aneurysm, without rupture: Secondary | ICD-10-CM | POA: Diagnosis not present

## 2018-03-14 DIAGNOSIS — K635 Polyp of colon: Secondary | ICD-10-CM | POA: Diagnosis not present

## 2018-03-14 DIAGNOSIS — D125 Benign neoplasm of sigmoid colon: Secondary | ICD-10-CM | POA: Diagnosis not present

## 2018-03-14 DIAGNOSIS — Z955 Presence of coronary angioplasty implant and graft: Secondary | ICD-10-CM | POA: Insufficient documentation

## 2018-03-14 DIAGNOSIS — Z6841 Body Mass Index (BMI) 40.0 and over, adult: Secondary | ICD-10-CM | POA: Insufficient documentation

## 2018-03-14 DIAGNOSIS — Z7901 Long term (current) use of anticoagulants: Secondary | ICD-10-CM | POA: Insufficient documentation

## 2018-03-14 DIAGNOSIS — K649 Unspecified hemorrhoids: Secondary | ICD-10-CM | POA: Diagnosis not present

## 2018-03-14 DIAGNOSIS — E119 Type 2 diabetes mellitus without complications: Secondary | ICD-10-CM | POA: Diagnosis not present

## 2018-03-14 DIAGNOSIS — D122 Benign neoplasm of ascending colon: Secondary | ICD-10-CM | POA: Insufficient documentation

## 2018-03-14 HISTORY — PX: POLYPECTOMY: SHX5525

## 2018-03-14 HISTORY — PX: BIOPSY: SHX5522

## 2018-03-14 HISTORY — PX: ESOPHAGOGASTRODUODENOSCOPY: SHX5428

## 2018-03-14 HISTORY — PX: COLONOSCOPY: SHX5424

## 2018-03-14 LAB — GLUCOSE, CAPILLARY: Glucose-Capillary: 135 mg/dL — ABNORMAL HIGH (ref 70–99)

## 2018-03-14 SURGERY — EGD (ESOPHAGOGASTRODUODENOSCOPY)
Anesthesia: Monitor Anesthesia Care

## 2018-03-14 MED ORDER — PANTOPRAZOLE SODIUM 40 MG PO TBEC: 40.0000 mg | DELAYED_RELEASE_TABLET | Freq: Every day | ORAL | Status: DC

## 2018-03-14 MED ORDER — PROPOFOL 10 MG/ML IV BOLUS
INTRAVENOUS | Status: AC
  Filled 2018-03-14: qty 60

## 2018-03-14 MED ORDER — ACETAMINOPHEN 500 MG PO TABS: 1000.0000 mg | ORAL_TABLET | Freq: Three times a day (TID) | ORAL | Status: DC | PRN

## 2018-03-14 MED ORDER — EUCERIN EX CREA: 1.0000 "application " | TOPICAL_CREAM | CUTANEOUS | Status: DC | PRN

## 2018-03-14 MED ORDER — PROPOFOL 500 MG/50ML IV EMUL
INTRAVENOUS | Status: DC | PRN
  Administered 2018-03-14: 125 ug/kg/min via INTRAVENOUS

## 2018-03-14 MED ORDER — MOMETASONE FURO-FORMOTEROL FUM 100-5 MCG/ACT IN AERO: 2.0000 | INHALATION_SPRAY | Freq: Two times a day (BID) | RESPIRATORY_TRACT | Status: DC

## 2018-03-14 MED ORDER — LOSARTAN POTASSIUM 25 MG PO TABS: 25.0000 mg | ORAL_TABLET | Freq: Every day | ORAL | Status: DC

## 2018-03-14 MED ORDER — NITROGLYCERIN 0.4 MG SL SUBL: 0.4000 mg | SUBLINGUAL_TABLET | SUBLINGUAL | Status: DC | PRN

## 2018-03-14 MED ORDER — PROPOFOL 10 MG/ML IV BOLUS
INTRAVENOUS | Status: DC | PRN
  Administered 2018-03-14: 40 mg via INTRAVENOUS

## 2018-03-14 MED ORDER — PSYLLIUM 95 % PO PACK: 1.0000 | PACK | Freq: Every day | ORAL | Status: DC

## 2018-03-14 MED ORDER — METOPROLOL TARTRATE 12.5 MG HALF TABLET: 12.5000 mg | ORAL_TABLET | Freq: Two times a day (BID) | ORAL | Status: DC

## 2018-03-14 MED ORDER — POTASSIUM CHLORIDE CRYS ER 20 MEQ PO TBCR: 40.0000 meq | EXTENDED_RELEASE_TABLET | Freq: Two times a day (BID) | ORAL | Status: DC

## 2018-03-14 MED ORDER — MONTELUKAST SODIUM 10 MG PO TABS: 10.0000 mg | ORAL_TABLET | Freq: Every day | ORAL | Status: DC

## 2018-03-14 MED ORDER — PROPOFOL 10 MG/ML IV BOLUS
INTRAVENOUS | Status: AC
  Filled 2018-03-14: qty 40

## 2018-03-14 MED ORDER — TRAMADOL HCL 50 MG PO TABS: 50.0000 mg | ORAL_TABLET | Freq: Two times a day (BID) | ORAL | Status: DC | PRN

## 2018-03-14 MED ORDER — RIVAROXABAN 20 MG PO TABS: 20.0000 mg | ORAL_TABLET | Freq: Every day | ORAL | Status: DC

## 2018-03-14 MED ORDER — GLYCOPYRROLATE 0.2 MG/ML IJ SOLN
INTRAMUSCULAR | Status: DC | PRN
  Administered 2018-03-14: 0.2 mg via INTRAVENOUS

## 2018-03-14 MED ORDER — LACTATED RINGERS IV SOLN
INTRAVENOUS | Status: DC
  Administered 2018-03-14: 09:00:00 via INTRAVENOUS

## 2018-03-14 MED ORDER — SODIUM CHLORIDE 0.9 % IV SOLN: INTRAVENOUS | Status: DC

## 2018-03-14 MED ORDER — TOPIRAMATE 25 MG PO CPSP: 50.0000 mg | ORAL_CAPSULE | Freq: Two times a day (BID) | ORAL | Status: DC

## 2018-03-14 MED ORDER — TAMSULOSIN HCL 0.4 MG PO CAPS: 0.4000 mg | ORAL_CAPSULE | Freq: Every evening | ORAL | Status: DC

## 2018-03-14 MED ORDER — ATORVASTATIN CALCIUM 10 MG PO TABS: 5.0000 mg | ORAL_TABLET | Freq: Every day | ORAL | Status: DC

## 2018-03-14 MED ORDER — TRIAMCINOLONE ACETONIDE 0.5 % EX CREA: TOPICAL_CREAM | Freq: Two times a day (BID) | CUTANEOUS | Status: DC

## 2018-03-14 MED ORDER — CELECOXIB 200 MG PO CAPS: 200.0000 mg | ORAL_CAPSULE | Freq: Two times a day (BID) | ORAL | Status: DC | PRN

## 2018-03-14 MED ORDER — SPIRONOLACTONE 25 MG PO TABS: 25.0000 mg | ORAL_TABLET | Freq: Every day | ORAL | Status: DC

## 2018-03-14 MED ORDER — METHOCARBAMOL 500 MG PO TABS: 500.0000 mg | ORAL_TABLET | Freq: Three times a day (TID) | ORAL | Status: DC | PRN

## 2018-03-14 MED ORDER — VITAMIN D-3 125 MCG (5000 UT) PO TABS: 5000.0000 [IU] | ORAL_TABLET | Freq: Every day | ORAL | Status: DC

## 2018-03-14 MED ORDER — POLYETHYL GLYCOL-PROPYL GLYCOL 0.4-0.3 % OP GEL: Freq: Every day | OPHTHALMIC | Status: DC | PRN

## 2018-03-14 MED ORDER — SILDENAFIL CITRATE 100 MG PO TABS: 100.0000 mg | ORAL_TABLET | ORAL | Status: DC | PRN

## 2018-03-14 MED ORDER — FUROSEMIDE 40 MG PO TABS: 80.0000 mg | ORAL_TABLET | Freq: Two times a day (BID) | ORAL | Status: DC

## 2018-03-14 MED ORDER — ALBUTEROL SULFATE HFA 108 (90 BASE) MCG/ACT IN AERS: 2.0000 | INHALATION_SPRAY | RESPIRATORY_TRACT | Status: DC | PRN

## 2018-03-14 MED ORDER — LIDOCAINE 2% (20 MG/ML) 5 ML SYRINGE
INTRAMUSCULAR | Status: DC | PRN
  Administered 2018-03-14: 100 mg via INTRAVENOUS

## 2018-03-14 NOTE — Anesthesia Postprocedure Evaluation (Signed)
Anesthesia Post Note  Patient: BHARGAV BARBARO  Procedure(s) Performed: ESOPHAGOGASTRODUODENOSCOPY (EGD) (N/A ) COLONOSCOPY (N/A ) BIOPSY POLYPECTOMY     Patient location during evaluation: PACU Anesthesia Type: MAC Level of consciousness: awake and alert Pain management: pain level controlled Vital Signs Assessment: post-procedure vital signs reviewed and stable Respiratory status: spontaneous breathing, nonlabored ventilation, respiratory function stable and patient connected to nasal cannula oxygen Cardiovascular status: stable and blood pressure returned to baseline Postop Assessment: no apparent nausea or vomiting Anesthetic complications: no    Last Vitals:  Vitals:   03/14/18 1032 03/14/18 1042  BP: 112/66 128/71  Pulse: (!) 59 (!) 56  Resp: 13 13  Temp: 36.5 C   SpO2: 100% 100%    Last Pain:  Vitals:   03/14/18 1042  TempSrc:   PainSc: 0-No pain                 Torrell Krutz S

## 2018-03-14 NOTE — Transfer of Care (Signed)
Immediate Anesthesia Transfer of Care Note  Patient: Carlos Dawson  Procedure(s) Performed: ESOPHAGOGASTRODUODENOSCOPY (EGD) (N/A ) COLONOSCOPY (N/A ) BIOPSY POLYPECTOMY  Patient Location: PACU and Endoscopy Unit  Anesthesia Type:MAC  Level of Consciousness: awake and drowsy  Airway & Oxygen Therapy: Patient Spontanous Breathing and Patient connected to nasal cannula oxygen  Post-op Assessment: Report given to RN and Post -op Vital signs reviewed and stable  Post vital signs: Reviewed and stable  Last Vitals:  Vitals Value Taken Time  BP    Temp    Pulse 59 03/14/2018 10:31 AM  Resp 13 03/14/2018 10:31 AM  SpO2 100 % 03/14/2018 10:31 AM  Vitals shown include unvalidated device data.  Last Pain:  Vitals:   03/14/18 0900  TempSrc: Oral  PainSc: 0-No pain         Complications: No apparent anesthesia complications

## 2018-03-14 NOTE — Discharge Instructions (Signed)

## 2018-03-14 NOTE — Op Note (Signed)
[facility] Patient Name: Carlos Dawson Procedure Date: 03/14/2018 MRN: [id number] Attending MD: Ronnette Juniper , MD Date of Birth: Apr 09, 1940 CSN: [id number] Age: 78 Admit Type: Outpatient Procedure:                Colonoscopy Indications:              Last colonoscopy: 2017, Unexplained iron deficiency                            anemia Providers:                Ronnette Juniper, MD, Raynelle Bring, RN, Cletis Athens,                            Technician, Stephanie [place] CRNA Referring MD:              Medicines:                Monitored Anesthesia Care Complications:            No immediate complications. Estimated blood loss:                            Minimal. Estimated Blood Loss:     Estimated blood loss was minimal. Procedure:                Pre-Anesthesia Assessment:                           - Prior to the procedure, a History and Physical                            was performed, and patient medications and                            allergies were reviewed. The patient's tolerance of                            previous anesthesia was also reviewed. The risks                            and benefits of the procedure and the sedation                            options and risks were discussed with the patient.                            All questions were answered, and informed consent                            was obtained. Prior Anticoagulants: The patient has                            taken Xarelto (rivaroxaban), last dose was 3 days                            prior to procedure. ASA  Grade Assessment: III - A                            patient with severe systemic disease. After                            reviewing the risks and benefits, the patient was                            deemed in satisfactory condition to undergo the                            procedure.                           - Prior to the procedure, a History and Physical     was performed, and patient medications and                            allergies were reviewed. The patient's tolerance of                            previous anesthesia was also reviewed. The risks                            and benefits of the procedure and the sedation                            options and risks were discussed with the patient.                            All questions were answered, and informed consent                            was obtained. Prior Anticoagulants: The patient has                            taken Xarelto (rivaroxaban), last dose was 3 days                            prior to procedure. ASA Grade Assessment: III - A                            patient with severe systemic disease. After                            reviewing the risks and benefits, the patient was                            deemed in satisfactory condition to undergo the                            procedure.  After obtaining informed consent, the colonoscope                            was passed under direct vision. Throughout the                            procedure, the patient's blood pressure, pulse, and                            oxygen saturations were monitored continuously. The                            PCF-H190DL ([id number] Olympus pediatric colonscope                            was introduced through the anus and advanced to the                            the cecum, identified by appendiceal orifice and                            ileocecal valve. The colonoscopy was performed                            without difficulty. The patient tolerated the                            procedure well. The quality of the bowel                            preparation was adequate to identify polyps 6 mm                            and larger in size. Scope In: 10:07:11 AM Scope Out: 10:25:03 AM Scope Withdrawal Time: 0 hours 15 minutes 36 seconds  Total Procedure  Duration: 0 hours 17 minutes 52 seconds  Findings:      Hemorrhoids were found on perianal exam.      Non-bleeding internal hemorrhoids were found during retroflexion and       during perianal exam. The hemorrhoids were large.      Three sessile polyps were found in the ascending colon. The polyps were       5 to 6 mm in size. These polyps were removed with a piecemeal technique       using a cold biopsy forceps. Resection and retrieval were complete.      Three sessile polyps were found in the sigmoid colon. The polyps were 5       to 8 mm in size. These polyps were removed with a cold biopsy forceps       and hot snare. Resection and retrieval were complete.      The exam was otherwise without abnormality.      There was a medium-sized lipoma, in the ascending colon. Impression:               - Hemorrhoids found on perianal exam.                           -  Non-bleeding internal hemorrhoids.                           - Three 5 to 6 mm polyps in the ascending colon,                            removed piecemeal using a cold biopsy forceps.                            Resected and retrieved.                           - Three 5 to 8 mm polyps in the sigmoid colon,                            removed with a hot snare. Resected and retrieved.                           - The examination was otherwise normal.                           - Medium-sized lipoma in the ascending colon. Moderate Sedation:      Patient did not receive moderate sedation for this procedure, but       instead received monitored anesthesia care. Recommendation:           - Patient has a contact number available for                            emergencies. The signs and symptoms of potential                            delayed complications were discussed with the                            patient. Return to normal activities tomorrow.                            Written discharge instructions were provided to the                             patient.                           - Resume regular diet.                           - Continue present medications.                           - Await pathology results.                           - Repeat colonoscopy for surveillance based on  pathology results.                           - Resume Xarelto (rivaroxaban) at prior dose                            tomorrow. Procedure Code(s):        --- Professional ---                           [phone], Colonoscopy, flexible; with removal of                            tumor(s), polyp(s), or other lesion(s) by snare                            technique                           45380, 64, Colonoscopy, flexible; with biopsy,                            single or multiple Diagnosis Code(s):        --- Professional ---                           K64.8, Other hemorrhoids                           D12.2, Benign neoplasm of ascending colon                           D12.5, Benign neoplasm of sigmoid colon                           D17.5, Benign lipomatous neoplasm of                            intra-abdominal organs                           D50.9, Iron deficiency anemia, unspecified CPT copyright 2018 American [employer] Association. All rights reserved. The codes documented in this report are preliminary and upon coder review may  be revised to meet current compliance requirements. Ronnette Juniper, MD 03/14/2018 10:35:48 AM This report has been signed electronically. Number of Addenda: 0

## 2018-03-14 NOTE — Brief Op Note (Signed)
03/14/2018  10:36 AM  PATIENT:  Carlos Dawson  78 y.o. male  PRE-OPERATIVE DIAGNOSIS:  Iron deficiency anemia, unspecified iron deficiency anemia type  POST-OPERATIVE DIAGNOSIS:  duodenal biopsies r/o celiac, ascending polyp biopsies, sigmoid biopsies and polypectomy, internal hemrohhids   PROCEDURE:  Procedure(s): ESOPHAGOGASTRODUODENOSCOPY (EGD) (N/A) COLONOSCOPY (N/A) BIOPSY POLYPECTOMY  SURGEON:  Surgeon(s) and Role:    Ronnette Juniper, MD - Primary  PHYSICIAN ASSISTANT:   ASSISTANTS: Raynelle Bring, RN, Cletis Athens, Tech   ANESTHESIA:   MAC  EBL:  Minimal  BLOOD ADMINISTERED:none  DRAINS: none   LOCAL MEDICATIONS USED:  NONE  SPECIMEN:  Biopsy / Limited Resection  DISPOSITION OF SPECIMEN:  PATHOLOGY  COUNTS:  YES  TOURNIQUET:  * No tourniquets in log *  DICTATION: .Dragon Dictation  PLAN OF CARE: Discharge to home after PACU  PATIENT DISPOSITION:  PACU - hemodynamically stable.   Delay start of Pharmacological VTE agent (>24hrs) due to surgical blood loss or risk of bleeding: yes

## 2018-03-14 NOTE — Anesthesia Preprocedure Evaluation (Signed)
Anesthesia Evaluation  Patient identified by MRN, date of birth, ID band Patient awake    Reviewed: Allergy & Precautions, NPO status , Patient's Chart, lab work & pertinent test results  Airway Mallampati: II  TM Distance: >3 FB Neck ROM: Full    Dental no notable dental hx.    Pulmonary asthma , sleep apnea , former smoker,    Pulmonary exam normal breath sounds clear to auscultation       Cardiovascular hypertension, + CAD, + Cardiac Stents, + Peripheral Vascular Disease and + DVT  Normal cardiovascular exam+ dysrhythmias Atrial Fibrillation  Rhythm:Regular Rate:Normal  Left ventricle: The cavity size was severely dilated. There was   moderate focal basal hypertrophy. Systolic function was normal.   The estimated ejection fraction was in the range of 60% to 65%.   Wall motion was normal; there were no regional wall motion   abnormalities. Features are consistent with a pseudonormal left   ventricular filling pattern, with concomitant abnormal relaxation   and increased filling pressure (grade 2 diastolic dysfunction).   Doppler parameters are consistent with high ventricular filling   pressure. - Aortic valve: Valve area (VTI): 2.79 cm^2. Valve area (Vmax): 2.8   cm^2. Valve area (Vmean): 2.57 cm^2. - Mitral valve: Valve area by pressure half-time: 2.1 cm^2. Valve   area by continuity equation (using LVOT flow): 3.12 cm^2. - Left atrium: The atrium was mildly dilated. - Tricuspid valve: There was mild regurgitation. - Pulmonic valve: Peak gradient (S): 12 mm Hg.  Impressions:  - Normal LVF with mild BSH, grade 2 DD, mild LAE, mild TR.    Neuro/Psych negative neurological ROS  negative psych ROS   GI/Hepatic negative GI ROS, Neg liver ROS,   Endo/Other  diabetesMorbid obesity  Renal/GU negative Renal ROS  negative genitourinary   Musculoskeletal negative musculoskeletal ROS (+)   Abdominal   Peds negative  pediatric ROS (+)  Hematology negative hematology ROS (+)   Anesthesia Other Findings   Reproductive/Obstetrics negative OB ROS                             Anesthesia Physical Anesthesia Plan  ASA: III  Anesthesia Plan: MAC   Post-op Pain Management:    Induction: Intravenous  PONV Risk Score and Plan:   Airway Management Planned: Nasal Cannula  Additional Equipment:   Intra-op Plan:   Post-operative Plan:   Informed Consent: I have reviewed the patients History and Physical, chart, labs and discussed the procedure including the risks, benefits and alternatives for the proposed anesthesia with the patient or authorized representative who has indicated his/her understanding and acceptance.     Dental advisory given  Plan Discussed with: CRNA and Surgeon  Anesthesia Plan Comments:         Anesthesia Quick Evaluation

## 2018-03-14 NOTE — Interval H&P Note (Signed)
History and Physical Interval Note: 77/male with IDA for an EGD and colonoscopy, last dose of Xarelto was 3 days ago..  03/14/2018 9:00 AM  Carlos Dawson  has presented today for EGD and colonoscopy, with the diagnosis of Iron deficiency anemia, unspecified iron deficiency anemia type  The various methods of treatment have been discussed with the patient and family. After consideration of risks, benefits and other options for treatment, the patient has consented to  Procedure(s): ESOPHAGOGASTRODUODENOSCOPY (EGD) (N/A) COLONOSCOPY (N/A) as a surgical intervention .  The patient's history has been reviewed, patient examined, no change in status, stable for surgery.  I have reviewed the patient's chart and labs.  Questions were answered to the patient's satisfaction.     Ronnette Juniper

## 2018-03-14 NOTE — Op Note (Signed)
[facility] Patient Name: Carlos Dawson Procedure Date: 03/14/2018 MRN: [id number] Attending MD: Ronnette Juniper , MD Date of Birth: 29-Apr-1940 CSN: [id number] Age: 78 Admit Type: Outpatient Procedure:                Upper GI endoscopy Indications:              Unexplained iron deficiency anemia Providers:                Ronnette Juniper, MD, Raynelle Bring, RN, Cletis [place],                            Technician, Stephanie [place] CRNA Referring MD:              Medicines:                Monitored Anesthesia Care Complications:            No immediate complications. Estimated blood loss:                            Minimal. Estimated Blood Loss:     Estimated blood loss was minimal. Procedure:                Pre-Anesthesia Assessment:                           - Prior to the procedure, a History and Physical                            was performed, and patient medications and                            allergies were reviewed. The patient's tolerance of                            previous anesthesia was also reviewed. The risks                            and benefits of the procedure and the sedation                            options and risks were discussed with the patient.                            All questions were answered, and informed consent                            was obtained. Prior Anticoagulants: The patient has                            taken Xarelto (rivaroxaban), last dose was 3 days                            prior to procedure. ASA Grade Assessment: III - A  patient with severe systemic disease. After                            reviewing the risks and benefits, the patient was                            deemed in satisfactory condition to undergo the                            procedure.                           After obtaining informed consent, the endoscope was                            passed under direct vision. Throughout  the                            procedure, the patient's blood pressure, pulse, and                            oxygen saturations were monitored continuously. The                            [id number]-[id number] ([id number] Olympus gastroscope was                            introduced through the mouth, and advanced to the                            second part of duodenum. The upper GI endoscopy was                            accomplished without difficulty. The patient                            tolerated the procedure well. Findings:      The examined esophagus was normal.      The Z-line was regular and was found 40 cm from the incisors.      The entire examined stomach was normal.      The examined duodenum was normal. Biopsies for histology were taken with       a cold forceps for evaluation of celiac disease.      Evidence of a banded gastroplasty was found on retroflexion and appeared       tight. This was traversed. Impression:               - Normal esophagus.                           - Z-line regular, 40 cm from the incisors.                           - Normal stomach. Evidence of prior gastric banding.                           -  Normal examined duodenum. Biopsied. Moderate Sedation:      Patient did not receive moderate sedation for this procedure, but       instead received monitored anesthesia care. Recommendation:           - Patient has a contact number available for                            emergencies. The signs and symptoms of potential                            delayed complications were discussed with the                            patient. Return to normal activities tomorrow.                            Written discharge instructions were provided to the                            patient.                           - Resume regular diet.                           - Continue present medications.                           - Await pathology results. Procedure Code(s):         --- Professional ---                           [phone], Esophagogastroduodenoscopy, flexible,                            transoral; with biopsy, single or multiple Diagnosis Code(s):        --- Professional ---                           D50.9, Iron deficiency anemia, unspecified CPT copyright 2018 American Medical Association. All rights reserved. The codes documented in this report are preliminary and upon coder review may  be revised to meet current compliance requirements. Ronnette Juniper, MD 03/14/2018 10:31:45 AM This report has been signed electronically. Number of Addenda: 0

## 2018-03-15 ENCOUNTER — Other Ambulatory Visit: Payer: Self-pay | Admitting: Surgery

## 2018-03-15 ENCOUNTER — Ambulatory Visit
Admission: RE | Admit: 2018-03-15 | Discharge: 2018-03-15 | Disposition: A | Discharge status: Home / Self Care | Payer: Medicare Other | Source: Ambulatory Visit | Attending: Surgery | Admitting: Surgery

## 2018-03-15 ENCOUNTER — Encounter (HOSPITAL_COMMUNITY): Payer: Self-pay | Admitting: Gastroenterology

## 2018-03-15 DIAGNOSIS — M1990 Unspecified osteoarthritis, unspecified site: Secondary | ICD-10-CM

## 2018-03-15 DIAGNOSIS — Z9884 Bariatric surgery status: Secondary | ICD-10-CM | POA: Diagnosis not present

## 2018-03-15 DIAGNOSIS — M1611 Unilateral primary osteoarthritis, right hip: Secondary | ICD-10-CM | POA: Diagnosis not present

## 2018-03-15 DIAGNOSIS — M160 Bilateral primary osteoarthritis of hip: Secondary | ICD-10-CM | POA: Diagnosis not present

## 2018-03-26 ENCOUNTER — Encounter (INDEPENDENT_AMBULATORY_CARE_PROVIDER_SITE_OTHER): Payer: Self-pay | Admitting: Orthopaedic Surgery

## 2018-03-26 ENCOUNTER — Ambulatory Visit (INDEPENDENT_AMBULATORY_CARE_PROVIDER_SITE_OTHER): Payer: Medicare Other | Admitting: Orthopaedic Surgery

## 2018-03-26 VITALS — Ht 67.25 in | Wt 258.0 lb

## 2018-03-26 DIAGNOSIS — M1612 Unilateral primary osteoarthritis, left hip: Secondary | ICD-10-CM

## 2018-03-26 DIAGNOSIS — M1611 Unilateral primary osteoarthritis, right hip: Secondary | ICD-10-CM | POA: Diagnosis not present

## 2018-03-26 DIAGNOSIS — M160 Bilateral primary osteoarthritis of hip: Secondary | ICD-10-CM | POA: Diagnosis not present

## 2018-03-26 NOTE — Progress Notes (Addendum)
Office Visit Note   Patient: Carlos Dawson           Date of Birth: Sep 25, 1940           MRN: [id number] Visit Date: 03/26/2018              Requested by: Carlos Huddle, MD [address] [place], [place] [address] PCP: Carlos Huddle, MD   Assessment & Plan: Visit Diagnoses:  1. Unilateral primary osteoarthritis, left hip   2. Unilateral primary osteoarthritis, right hip     Plan: I did speak with him in detail about hip replacement surgery as well as the risk and benefits involved.  He understands that his risk of acute blood loss anemia infection dislocation and implant failure are all heightened given his obesity as well as skin wound issues.  He understands her goals are decrease pain improve mobility and overall poor quality of life.  We talked about his intraoperative and postoperative course.  We went over his x-rays in detail and I showed him a hip model and explained what the surgery involves clinically and mechanically.  I talked about his intraoperative and postoperative course in terms of therapy.  All questions concerns were answered and addressed.  He stable work on activity modification and weight loss in the interim and good blood glucose control.  We will work on getting the surgery scheduled.  When we did weigh him in the office, his BMI is 40.  It actually needs to be below this before we can actually schedule his surgery because his insurance is part of a bundle payment system but does not allow [place] to schedule any surgeries until the BMI is below 39.4.  With that being said we will see him back in a month after he works on activity modification continue weight loss so we can weigh him again.  His hemoglobin A1c in December was 7.3 so he certainly had in the right direction but we still cannot schedule the surgery because that is a hard stop on trying to schedule this based on insurance and based on credible research showing that the risk of infection as well as  implant failure and other complications is heightened with a BMI of 40 or above.  Follow-Up Instructions: Return for 2 weeks post-op.   Orders:  No orders of the defined types were placed in this encounter.  No orders of the defined types were placed in this encounter.     Procedures: No procedures performed   Clinical Data: No additional findings.   Subjective: Chief Complaint  Patient presents with  . Left Hip - Pain  . Right Hip - Pain  The patient is sent from his general surgeon Carlos Dawson to evaluate and treat bilateral hip osteoarthritis with really severe right hip pain.  He does have a BMI that was calculated at 41 and is a diabetic but under good control.  He does take Xarelto as a blood thinner but can come off of this without being bridged for surgery.  His pain is daily and it is detrimental effect his mobility, his quality of life and his activities daily living.  He has had significant back surgery over the years in his lumbar spine and cervical spine.  At this point his pain is severe enough and 10 out of 10 that he would like to proceed with hip replacement surgery if you are comfortable doing this.  He understands that his hemoglobin A1c does need  to be below 7.5 and his BMI needs to be below 40 however he is lost weight and he is close to that number with a BMI of 41.  His pain is in the groin on the right side.  He does have some pain and stiffness on the left side but is nothing like it is on the right side.  He has had steroid injections in the past but it has elevated his blood sugar.  He is work on activity modification and weight loss.  He cannot take anti-inflammatories due to being on a blood thinner.  He has been through therapy as well.  HPI  Review of Systems He currently denies any headache, chest pain, shortness of breath, fever, chills, nausea, vomiting  Objective: Vital Signs: Ht 5\' 7"  (1.702 m)   Wt 263 lb (119.3 kg)   BMI 41.19 kg/m    Physical Exam He is alert and orient x3 and in no acute distress Ortho Exam Examination of both hips show to have pain in the groin with internal X rotation and significant limitations with internal X rotation due to stiffness.  His leg lengths are equal. Specialty Comments:  No specialty comments available.  Imaging: No results found. X-rays on the canopy system are independently reviewed of both hips.  He does show end-stage arthritis with large particular osteophytes around both hips and joint space narrowing that is significant.  PMFS History: Patient Active Problem List   Diagnosis Date Noted  . Unilateral primary osteoarthritis, right hip 03/26/2018  . Atrial fibrillation with rapid ventricular response ([place]) 04/15/2017  . SOB (shortness of breath)   . Sleep apnea   . Prostate CA ([place])   . Pneumonia   . PAF (paroxysmal atrial fibrillation) ([place])   . Mild intermittent asthma   . IBS (irritable bowel syndrome)   . Hearing loss   . GERD (gastroesophageal reflux disease)   . Generalized headaches   . Diastolic dysfunction   . Contact lens/glasses fitting   . Diabetes mellitus without complication ([place])   . Colonic polyp   . Heart failure ([place])   . ASCVD (arteriosclerotic cardiovascular disease)   . Ascending aortic aneurysm ([place])   . Aortic root enlargement ([place])   . S/P lumbar spinal fusion 10/18/2016  . Chest pain 09/15/2016  . Paroxysmal atrial fibrillation with RVR ([place]) 09/15/2016  . Hypotension 09/15/2016  . S/P lumbar laminectomy 05/04/2016  . Oral candidiasis 03/30/2016  . Pain in right hip 03/29/2016  . Lumbar radiculopathy 03/29/2016  . Post laminectomy syndrome 03/29/2016  . Severe obesity (BMI >= 40) ([place]) 04/26/2015  . Asthma with acute exacerbation 04/22/2015  . CAP (community acquired pneumonia) 08/12/2013  . Encounter for therapeutic drug monitoring 03/03/2013  . Chest pain at rest 01/06/2013  . Chronic diastolic heart failure ([place]) 12/26/2012  .  HLD (hyperlipidemia) 12/26/2012  . Allergic rhinitis 12/24/2012  . Long term (current) use of anticoagulants 08/29/2012  . Lapband APL May 2009 10/25/2011  . Hypoxemia 12/08/2010  . Dyspnea 12/08/2010  . Hypertension   . Obesities, morbid ([place])   . Pulmonary embolism ([place])   . Anticoagulant long-term use   . CAD (coronary artery disease)   . Thoracic aortic aneurysm ([place])   . OA (osteoarthritis)   . LVH (left ventricular hypertrophy)   . Paroxysmal atrial fibrillation ([facility])   . Mild persistent asthma, well controlled 10/21/2008  . OSA (obstructive sleep apnea) 08/06/2008   Past Medical History:  Diagnosis Date  . Anticoagulant long-term use   .  Aortic root enlargement ([place])   . Ascending aortic aneurysm [facility])    recent scan in October 2012 showing no change; followed by Dr. Servando Snare  . ASCVD (arteriosclerotic cardiovascular disease)    Prior BMS to the 2nd OM in September 2012; with repeat cath in October showing patency  . CAD (coronary artery disease)    a. s/p BMS to 2nd OM in Sept 2012; b. LexiScan Myoview (12/2012):  Inf infarct; bowel and motion artifact make study difficult to interpret; no ischemia; not gated; Low Risk  . CHF (congestive heart failure) ([place])    no recent issues 10/13/14  . Chronic back pain    "all over my back" (05/11/2017)  . Colonic polyp   . Contact lens/glasses fitting   . Diastolic dysfunction   . DVT (deep venous thrombosis) ([place])    ?LLE  . Frequent headaches    "probably weekly" (05/11/2017)  . Generalized headaches    neck stenosis  . GERD (gastroesophageal reflux disease)   . Hearing loss   . Hearing loss    more so on left  . Hemorrhoids   . History of stomach ulcers   . Hypertension   . IBS (irritable bowel syndrome)   . LVH (left ventricular hypertrophy)   . Mild intermittent asthma   . OA (osteoarthritis)    "all over" (05/11/2017)  . Obesities, morbid ([place])   . OSA (obstructive sleep apnea)    PSG 03/30/97 AHI 21, BPAP 13/9    . OSA on CPAP   . PAF (paroxysmal atrial fibrillation) ([place])    a. on Xarelto b. s/p DCCV in 08/2016; b. Tikosyn failed 04/16/17 with plans for Multaq and possible Afib ablation with Dr. Rayann Heman  . Pneumonia    'several times" (05/11/2017)  . Prostate CA [facility])    Oncologist  DR. Dar[facility] Milch [facility] 09/24/14, undetermined tx   prostate; S/P "radiation and hormone injections"  . Pulmonary embolism ([place]) 2008   "both lungs"  . SOB (shortness of breath)    on excertion  . Thoracic aortic aneurysm (H[facility])    Aortic Size Index=     5.0    /Body surface area is 2.43 meters squared. = 2.05  < 2.75 cm/m2      4% risk per year 2.75 to 4.25          8% risk per year > 4.25 cm/m2    20% risk per year   Stable aneurysmal dilation of the ascending aorta with maximum AP diameter of 4.8 cm. Stable area of narrowing of the proximal most portion of the descending aorta measuring 2 cm., previously identified as an area of coarctation. No evidence of aortic dissection.  Coronary artery disease.  Normal appearance of the lungs.   Electronically Signed   By: Fidela Salisbury M.D.   On: 10/01/2014 08:50    . Type II diabetes mellitus (HCC)    metphormin, average 154 dx 2017    Family History  Problem Relation Age of Onset  . Heart disease Mother   . Diabetes Mother   . Other Mother        stent placement  . Emphysema Father 71  . Heart attack Sister     Past Surgical History:  Procedure Laterality Date  . ACHILLES TENDON REPAIR Bilateral   . AORTIC ARCH ANGIOGRAPHY N/A 03/13/2017   Procedure: AORTIC ARCH ANGIOGRAPHY;  Surgeon: Martinique, [name], MD;  Location: [facility];  Service: Cardiovascular;  Laterality: N/A;  .  APPENDECTOMY    . ATRIAL FIBRILLATION ABLATION  05/11/2017  . ATRIAL FIBRILLATION ABLATION N/A 05/11/2017   Procedure: ATRIAL FIBRILLATION ABLATION;  Surgeon: [name], MD;  Location: [facility];  Service: Cardiovascular;  Laterality: N/A;  . BACK SURGERY     "I've had 7 back  and 1 neck ORs" (05/11/2017)  . BIOPSY  03/14/2018   Procedure: BIOPSY;  Surgeon: Ronnette Juniper, MD;  Location: [facility];  Service: Gastroenterology;;  EGD and Colon  . CARDIAC CATHETERIZATION  2006  . CARPAL TUNNEL RELEASE Bilateral    LEFT  . CATARACT EXTRACTION W/ INTRAOCULAR LENS  IMPLANT, BILATERAL Bilateral   . CERVICAL SPINE SURGERY  06/02/2010   lower back and neck  . COLONOSCOPY N/A 03/14/2018   Procedure: COLONOSCOPY;  Surgeon: Ronnette Juniper, MD;  Location: [facility];  Service: Gastroenterology;  Laterality: N/A;  . COLONOSCOPY WITH PROPOFOL N/A 12/29/2014   Procedure: COLONOSCOPY WITH PROPOFOL;  Surgeon: Garlan Fair, MD;  Location: [facility];  Service: Endoscopy;  Laterality: N/A;  . CORONARY ANGIOPLASTY WITH STENT PLACEMENT  October 2012  . CORONARY STENT PLACEMENT  Sept 2012   2nd OM with BMS  . ESOPHAGOGASTRODUODENOSCOPY N/A 03/14/2018   Procedure: ESOPHAGOGASTRODUODENOSCOPY (EGD);  Surgeon: Ronnette Juniper, MD;  Location: [facility];  Service: Gastroenterology;  Laterality: N/A;  . HEMORROIDECTOMY    . LAMINECTOMY  05/30/2012   L 4 L5  . LAMINECTOMY WITH POSTERIOR LATERAL ARTHRODESIS LEVEL 3 N/A 10/18/2016   Procedure: Posterior Lateral Fusion - Lumbar One-Four, segmental instrumentation Lumbar One-Five,  decompression,;  Surgeon: Eustace Moore, MD;  [facility];  Service: Neurosurgery;  Laterality: N/A;  . LAPAROSCOPIC CHOLECYSTECTOMY    . LAPAROSCOPIC GASTRIC BANDING    . LEFT AND RIGHT HEART CATHETERIZATION WITH CORONARY ANGIOGRAM N/A 05/07/2014   Procedure: LEFT AND RIGHT HEART CATHETERIZATION WITH CORONARY ANGIOGRAM;  Surgeon: [name] Martinique, MD;  Location: [facility];  Service: Cardiovascular;  Laterality: N/A;  . LEFT HEART CATH AND CORONARY ANGIOGRAPHY N/A 03/13/2017   Procedure: LEFT HEART CATH AND CORONARY ANGIOGRAPHY;  Surgeon: Martinique, [name], MD;  Location: [place] [facility];  Service: Cardiovascular;  Laterality: N/A;  . LUMBAR LAMINECTOMY/DECOMPRESSION  MICRODISCECTOMY N/A 05/04/2016   Procedure: Laminectomy and Foraminotomy - Thoracic twelve-Lumbar one -Posterior Fusion Lumbar one-two;  Surgeon: Eustace Moore, MD;  Location: [place];  Service: Neurosurgery;  Laterality: N/A;  . POLYPECTOMY  03/14/2018   Procedure: POLYPECTOMY;  Surgeon: Ronnette Juniper, MD;  Location: [facility];  Service: Gastroenterology;;  . POSTERIOR LUMBAR FUSION  10/18/2016  . SHOULDER OPEN ROTATOR CUFF REPAIR Bilateral   . TONSILLECTOMY AND ADENOIDECTOMY    . TRIGGER FINGER RELEASE     LEFT  . UVULOPALATOPHARYNGOPLASTY    . VASECTOMY     Social History   Occupational History  . Occupation: Retired from Scientist, clinical (histocompatibility and immunogenetics): RETIRED  Tobacco Use  . Smoking status: Former Smoker    Packs/day: 1.50    Years: 30.00    Pack years: 45.00    Last attempt to quit: 01/24/1992    Years since quitting: 26.1  . Smokeless tobacco: Never Used  Substance and Sexual Activity  . Alcohol use: No  . Drug use: No  . Sexual activity: Not Currently

## 2018-04-01 ENCOUNTER — Telehealth: Payer: Self-pay | Admitting: Cardiology

## 2018-04-01 DIAGNOSIS — I480 Paroxysmal atrial fibrillation: Secondary | ICD-10-CM | POA: Diagnosis not present

## 2018-04-01 DIAGNOSIS — E119 Type 2 diabetes mellitus without complications: Secondary | ICD-10-CM | POA: Diagnosis not present

## 2018-04-01 DIAGNOSIS — E785 Hyperlipidemia, unspecified: Secondary | ICD-10-CM | POA: Diagnosis not present

## 2018-04-01 DIAGNOSIS — N400 Enlarged prostate without lower urinary tract symptoms: Secondary | ICD-10-CM | POA: Diagnosis not present

## 2018-04-01 DIAGNOSIS — J4530 Mild persistent asthma, uncomplicated: Secondary | ICD-10-CM | POA: Diagnosis not present

## 2018-04-01 DIAGNOSIS — D649 Anemia, unspecified: Secondary | ICD-10-CM | POA: Diagnosis not present

## 2018-04-01 DIAGNOSIS — I10 Essential (primary) hypertension: Secondary | ICD-10-CM | POA: Diagnosis not present

## 2018-04-01 DIAGNOSIS — I5030 Unspecified diastolic (congestive) heart failure: Secondary | ICD-10-CM | POA: Diagnosis not present

## 2018-04-01 DIAGNOSIS — I2510 Atherosclerotic heart disease of native coronary artery without angina pectoris: Secondary | ICD-10-CM | POA: Diagnosis not present

## 2018-04-01 NOTE — Telephone Encounter (Signed)
Carlos Dawson, from Dr. Inda Merlin office called because they are going to start he patient on gardiance 25 mg, patient is currently taking furosemide (LASIX) 80 MG tablet 2x a day.   They are wanting to lower his lasix to 80mg  in the morning and 40mg  in the evening, when they start him on gardiance.  They want to know if Dr. Martinique is in agreement to this.

## 2018-04-01 NOTE — Telephone Encounter (Signed)
Carlos Dawson called and message was left that Dr Martinique agreed to the plan of starting gardiance 25 mg and decreasing Lasix to 80 mg in the am and 40 mg in the PM

## 2018-04-01 NOTE — Telephone Encounter (Signed)
Agree this is a good idea  Peter Martinique MD, [facility]

## 2018-04-02 DIAGNOSIS — Z6841 Body Mass Index (BMI) 40.0 and over, adult: Secondary | ICD-10-CM | POA: Diagnosis not present

## 2018-04-02 DIAGNOSIS — I10 Essential (primary) hypertension: Secondary | ICD-10-CM | POA: Diagnosis not present

## 2018-04-02 DIAGNOSIS — M4802 Spinal stenosis, cervical region: Secondary | ICD-10-CM | POA: Diagnosis not present

## 2018-04-04 ENCOUNTER — Other Ambulatory Visit: Payer: Self-pay | Admitting: Neurological Surgery

## 2018-04-04 DIAGNOSIS — M4802 Spinal stenosis, cervical region: Secondary | ICD-10-CM

## 2018-04-05 ENCOUNTER — Other Ambulatory Visit: Payer: Self-pay | Admitting: Cardiology

## 2018-04-10 DIAGNOSIS — M25559 Pain in unspecified hip: Secondary | ICD-10-CM | POA: Diagnosis not present

## 2018-04-10 DIAGNOSIS — I5030 Unspecified diastolic (congestive) heart failure: Secondary | ICD-10-CM | POA: Diagnosis not present

## 2018-04-10 DIAGNOSIS — E785 Hyperlipidemia, unspecified: Secondary | ICD-10-CM | POA: Diagnosis not present

## 2018-04-10 DIAGNOSIS — E119 Type 2 diabetes mellitus without complications: Secondary | ICD-10-CM | POA: Diagnosis not present

## 2018-04-10 DIAGNOSIS — I2510 Atherosclerotic heart disease of native coronary artery without angina pectoris: Secondary | ICD-10-CM | POA: Diagnosis not present

## 2018-04-10 DIAGNOSIS — I10 Essential (primary) hypertension: Secondary | ICD-10-CM | POA: Diagnosis not present

## 2018-04-16 ENCOUNTER — Telehealth (INDEPENDENT_AMBULATORY_CARE_PROVIDER_SITE_OTHER): Payer: Self-pay | Admitting: *Deleted

## 2018-04-16 NOTE — Telephone Encounter (Signed)
Telephone call to patient

## 2018-04-18 DIAGNOSIS — C61 Malignant neoplasm of prostate: Secondary | ICD-10-CM | POA: Diagnosis not present

## 2018-04-18 DIAGNOSIS — D509 Iron deficiency anemia, unspecified: Secondary | ICD-10-CM | POA: Diagnosis not present

## 2018-04-18 DIAGNOSIS — D649 Anemia, unspecified: Secondary | ICD-10-CM | POA: Diagnosis not present

## 2018-04-18 DIAGNOSIS — I2510 Atherosclerotic heart disease of native coronary artery without angina pectoris: Secondary | ICD-10-CM | POA: Diagnosis not present

## 2018-04-18 DIAGNOSIS — E785 Hyperlipidemia, unspecified: Secondary | ICD-10-CM | POA: Diagnosis not present

## 2018-04-18 DIAGNOSIS — N183 Chronic kidney disease, stage 3 (moderate): Secondary | ICD-10-CM | POA: Diagnosis not present

## 2018-04-18 DIAGNOSIS — E119 Type 2 diabetes mellitus without complications: Secondary | ICD-10-CM | POA: Diagnosis not present

## 2018-04-18 DIAGNOSIS — I10 Essential (primary) hypertension: Secondary | ICD-10-CM | POA: Diagnosis not present

## 2018-04-18 DIAGNOSIS — I4891 Unspecified atrial fibrillation: Secondary | ICD-10-CM | POA: Diagnosis not present

## 2018-04-18 DIAGNOSIS — N400 Enlarged prostate without lower urinary tract symptoms: Secondary | ICD-10-CM | POA: Diagnosis not present

## 2018-04-18 DIAGNOSIS — I480 Paroxysmal atrial fibrillation: Secondary | ICD-10-CM | POA: Diagnosis not present

## 2018-04-22 DIAGNOSIS — D509 Iron deficiency anemia, unspecified: Secondary | ICD-10-CM | POA: Diagnosis not present

## 2018-04-23 ENCOUNTER — Ambulatory Visit (INDEPENDENT_AMBULATORY_CARE_PROVIDER_SITE_OTHER): Payer: Medicare Other | Admitting: Orthopaedic Surgery

## 2018-04-26 ENCOUNTER — Other Ambulatory Visit: Payer: Self-pay

## 2018-04-26 MED ORDER — POTASSIUM CHLORIDE CRYS ER 20 MEQ PO TBCR: 40.0000 meq | EXTENDED_RELEASE_TABLET | Freq: Two times a day (BID) | ORAL | 2 refills | Status: DC

## 2018-04-29 ENCOUNTER — Other Ambulatory Visit: Payer: Medicare Other

## 2018-04-29 ENCOUNTER — Telehealth: Payer: Self-pay | Admitting: Cardiology

## 2018-04-29 NOTE — Telephone Encounter (Signed)
Patient okay to cancel his appointment and move it, I offered virtual visit patient denied. Appointment is cancelled  He is doing fine no issues.   I will route to nurse for priority of recall, and placed in recall pool.      Cardiac Questionnaire:    Since your last visit or hospitalization:    1. Have you been having new or worsening chest pain? No   2. Have you been having new or worsening shortness of breath? No 3. Have you been having new or worsening leg swelling, wt gain, or increase in abdominal girth (pants fitting more tightly)? No   4. Have you had any passing out spells? No

## 2018-04-29 NOTE — Telephone Encounter (Signed)
New Message    Pt is calling and wondering if he is still needing to come into the office for his appt  Please call

## 2018-04-29 NOTE — Telephone Encounter (Signed)
Spoke to patient appointment with Dr.Jordan rescheduled to 09/16/18 at 10:20 am.Advised to call sooner if needed.

## 2018-05-02 ENCOUNTER — Ambulatory Visit: Payer: Medicare Other | Admitting: Cardiology

## 2018-05-17 DIAGNOSIS — J984 Other disorders of lung: Secondary | ICD-10-CM | POA: Diagnosis not present

## 2018-05-17 DIAGNOSIS — I2510 Atherosclerotic heart disease of native coronary artery without angina pectoris: Secondary | ICD-10-CM | POA: Diagnosis present

## 2018-05-17 DIAGNOSIS — Z9884 Bariatric surgery status: Secondary | ICD-10-CM | POA: Diagnosis not present

## 2018-05-17 DIAGNOSIS — R198 Other specified symptoms and signs involving the digestive system and abdomen: Secondary | ICD-10-CM | POA: Diagnosis not present

## 2018-05-17 DIAGNOSIS — I482 Chronic atrial fibrillation, unspecified: Secondary | ICD-10-CM | POA: Diagnosis present

## 2018-05-17 DIAGNOSIS — R109 Unspecified abdominal pain: Secondary | ICD-10-CM | POA: Diagnosis not present

## 2018-05-17 DIAGNOSIS — Z885 Allergy status to narcotic agent status: Secondary | ICD-10-CM | POA: Diagnosis not present

## 2018-05-17 DIAGNOSIS — K529 Noninfective gastroenteritis and colitis, unspecified: Secondary | ICD-10-CM | POA: Diagnosis present

## 2018-05-17 DIAGNOSIS — I10 Essential (primary) hypertension: Secondary | ICD-10-CM | POA: Diagnosis present

## 2018-05-17 DIAGNOSIS — R1084 Generalized abdominal pain: Secondary | ICD-10-CM | POA: Diagnosis not present

## 2018-05-17 DIAGNOSIS — E785 Hyperlipidemia, unspecified: Secondary | ICD-10-CM | POA: Diagnosis present

## 2018-05-17 DIAGNOSIS — D649 Anemia, unspecified: Secondary | ICD-10-CM | POA: Diagnosis present

## 2018-05-17 DIAGNOSIS — Z7901 Long term (current) use of anticoagulants: Secondary | ICD-10-CM | POA: Diagnosis not present

## 2018-05-17 DIAGNOSIS — K56609 Unspecified intestinal obstruction, unspecified as to partial versus complete obstruction: Secondary | ICD-10-CM | POA: Diagnosis not present

## 2018-05-17 DIAGNOSIS — Z87891 Personal history of nicotine dependence: Secondary | ICD-10-CM | POA: Diagnosis not present

## 2018-05-17 DIAGNOSIS — Z9049 Acquired absence of other specified parts of digestive tract: Secondary | ICD-10-CM | POA: Diagnosis not present

## 2018-05-17 DIAGNOSIS — Z8546 Personal history of malignant neoplasm of prostate: Secondary | ICD-10-CM | POA: Diagnosis not present

## 2018-05-17 DIAGNOSIS — Z8711 Personal history of peptic ulcer disease: Secondary | ICD-10-CM | POA: Diagnosis not present

## 2018-05-17 DIAGNOSIS — R17 Unspecified jaundice: Secondary | ICD-10-CM | POA: Diagnosis not present

## 2018-05-17 DIAGNOSIS — Z955 Presence of coronary angioplasty implant and graft: Secondary | ICD-10-CM | POA: Diagnosis not present

## 2018-05-20 ENCOUNTER — Ambulatory Visit (INDEPENDENT_AMBULATORY_CARE_PROVIDER_SITE_OTHER): Payer: Medicare Other | Admitting: Orthopaedic Surgery

## 2018-05-20 DIAGNOSIS — I10 Essential (primary) hypertension: Secondary | ICD-10-CM | POA: Diagnosis not present

## 2018-05-20 DIAGNOSIS — E119 Type 2 diabetes mellitus without complications: Secondary | ICD-10-CM | POA: Diagnosis not present

## 2018-05-20 DIAGNOSIS — D649 Anemia, unspecified: Secondary | ICD-10-CM | POA: Diagnosis not present

## 2018-05-20 DIAGNOSIS — I4891 Unspecified atrial fibrillation: Secondary | ICD-10-CM | POA: Diagnosis not present

## 2018-05-20 DIAGNOSIS — N183 Chronic kidney disease, stage 3 (moderate): Secondary | ICD-10-CM | POA: Diagnosis not present

## 2018-05-20 DIAGNOSIS — I480 Paroxysmal atrial fibrillation: Secondary | ICD-10-CM | POA: Diagnosis not present

## 2018-05-20 DIAGNOSIS — D509 Iron deficiency anemia, unspecified: Secondary | ICD-10-CM | POA: Diagnosis not present

## 2018-05-20 DIAGNOSIS — C61 Malignant neoplasm of prostate: Secondary | ICD-10-CM | POA: Diagnosis not present

## 2018-05-20 DIAGNOSIS — N400 Enlarged prostate without lower urinary tract symptoms: Secondary | ICD-10-CM | POA: Diagnosis not present

## 2018-05-20 DIAGNOSIS — E785 Hyperlipidemia, unspecified: Secondary | ICD-10-CM | POA: Diagnosis not present

## 2018-05-20 DIAGNOSIS — I2510 Atherosclerotic heart disease of native coronary artery without angina pectoris: Secondary | ICD-10-CM | POA: Diagnosis not present

## 2018-06-05 ENCOUNTER — Other Ambulatory Visit: Payer: Self-pay | Admitting: *Deleted

## 2018-06-14 ENCOUNTER — Ambulatory Visit
Admission: RE | Admit: 2018-06-14 | Discharge: 2018-06-14 | Disposition: A | Discharge status: Home / Self Care | Payer: Medicare Other | Source: Ambulatory Visit | Attending: Neurological Surgery | Admitting: Neurological Surgery

## 2018-06-14 ENCOUNTER — Other Ambulatory Visit: Payer: Self-pay

## 2018-06-14 DIAGNOSIS — M4802 Spinal stenosis, cervical region: Secondary | ICD-10-CM | POA: Diagnosis not present

## 2018-06-18 ENCOUNTER — Other Ambulatory Visit: Payer: Self-pay

## 2018-06-18 ENCOUNTER — Encounter: Payer: Self-pay | Admitting: Orthopaedic Surgery

## 2018-06-18 ENCOUNTER — Ambulatory Visit (INDEPENDENT_AMBULATORY_CARE_PROVIDER_SITE_OTHER): Payer: Medicare Other | Admitting: Orthopaedic Surgery

## 2018-06-18 VITALS — Wt 248.0 lb

## 2018-06-18 DIAGNOSIS — M19021 Primary osteoarthritis, right elbow: Secondary | ICD-10-CM | POA: Diagnosis not present

## 2018-06-18 DIAGNOSIS — M1612 Unilateral primary osteoarthritis, left hip: Secondary | ICD-10-CM

## 2018-06-18 DIAGNOSIS — M1611 Unilateral primary osteoarthritis, right hip: Secondary | ICD-10-CM | POA: Diagnosis not present

## 2018-06-18 NOTE — Progress Notes (Signed)
Office Visit Note   Patient: Carlos Dawson           Date of Birth: [birth year]           MRN: [id number] Visit Date: 06/18/2018              Requested by: Josetta Huddle, MD [address] [place], [place] [address] PCP: Josetta Huddle, MD   Assessment & Plan: Visit Diagnoses:  1. Unilateral primary osteoarthritis, left hip   2. Unilateral primary osteoarthritis, right hip     Plan: At this point we are comfortable with proceeding with a total hip arthroplasty for his right hip given now that his BMI is only 38.55 on today's visit.  He is going to continue to work on blood glucose control.  He will stop Xarelto 4 days before surgery.  He does not need to be bridged.  He does follow-up with his cardiologist regularly.  He currently has no active issues otherwise.  We had a long and thorough discussion about what the surgery involves including the risk and benefits of surgery.  We talked about his intraoperative and postoperative course and what to expect.  He understands that right now it is hard to tell him a definitive surgery date due to the coronavirus pandemic and were slowly getting back to elective surgeries.  We will be in touch about surgery dates.  All question concerns were answered and addressed.  Follow-Up Instructions: Return for 2 weeks post-op.   Orders:  No orders of the defined types were placed in this encounter.  No orders of the defined types were placed in this encounter.     Procedures: No procedures performed   Clinical Data: No additional findings.   Subjective: Chief Complaint  Patient presents with   Left Hip - Pain   Right Hip - Pain  The patient is being seen in follow-up.  He has known severe osteoarthritis in both his hips with the right being worse than the left.  He is someone who is on chronic Xarelto but can come off of this without being bridged for surgical purposes.  We have been wanting to schedule him for a right total  hip arthroplasty but his BMI was over 40.  He is worked on weight loss and we last saw him 3 months ago.  He is also work to try to keep his blood glucose under control.  A previous hemoglobin A1c that I can see in the chart was 7.3.  He has reported weight loss.  He reports daily right hip pain.  He also has low back pain and a history of 3 previous lumbar spine surgeries.  He still reports 10 of 10 pain in his right hip and his left hip but much worse on the right side.  He is tried and failed conservative treatment for well over 12 months.  This includes weight loss, activity modification, hip strengthening exercises, and using assistive device to walk.  He cannot take anti-inflammatories due to being on a blood thinner.  HPI  Review of Systems He currently denies any headache, chest pain, shortness of breath, fever, chills, nausea, vomiting  Objective: Vital Signs: Wt 248 lb (112.5 kg)    BMI 38.55 kg/m   Physical Exam He is alert and orient x3 and in no acute distress Ortho Exam Examination of his right and left hip shows significant pain with attempts of internal and external rotation.  There are no blocks rotation but  he is very stiff and very painful. Specialty Comments:  No specialty comments available.  Imaging: No results found. Previous films show severe end-stage arthritis of both hips with joint space narrowing, flattening of the femoral head, particular osteophytes, cystic changes and sclerotic changes.  PMFS History: Patient Active Problem List   Diagnosis Date Noted   Unilateral primary osteoarthritis, right hip 03/26/2018   Atrial fibrillation with rapid ventricular response ([place]) 04/15/2017   SOB (shortness of breath)    Sleep apnea    Prostate CA (HCC)    Pneumonia    PAF (paroxysmal atrial fibrillation) (HCC)    Mild intermittent asthma    IBS (irritable bowel syndrome)    Hearing loss    GERD (gastroesophageal reflux disease)    Generalized  headaches    Diastolic dysfunction    Contact lens/glasses fitting    Diabetes mellitus without complication ([place])    Colonic polyp    Heart failure (HCC)    ASCVD (arteriosclerotic cardiovascular disease)    Ascending aortic aneurysm (HCC)    Aortic root enlargement (HCC)    S/P lumbar spinal fusion 10/18/2016   Chest pain 09/15/2016   Paroxysmal atrial fibrillation with RVR (HCC) 09/15/2016   Hypotension 09/15/2016   S/P lumbar laminectomy 05/04/2016   Oral candidiasis 03/30/2016   Pain in right hip 03/29/2016   Lumbar radiculopathy 03/29/2016   Post laminectomy syndrome 03/29/2016   Severe obesity (BMI >= 40) ([place]) 04/26/2015   Asthma with acute exacerbation 04/22/2015   CAP (community acquired pneumonia) 08/12/2013   Encounter for therapeutic drug monitoring 03/03/2013   Chest pain at rest 01/06/2013   Chronic diastolic heart failure ([place]) 12/26/2012   HLD (hyperlipidemia) 12/26/2012   Allergic rhinitis 12/24/2012   Long term (current) use of anticoagulants 08/29/2012   Lapband APL May 2009 10/25/2011   Hypoxemia 12/08/2010   Dyspnea 12/08/2010   Hypertension    Obesities, morbid ([place])    Pulmonary embolism (HCC)    Anticoagulant long-term use    CAD (coronary artery disease)    Thoracic aortic aneurysm (HCC)    OA (osteoarthritis)    LVH (left ventricular hypertrophy)    Paroxysmal atrial fibrillation (HCC)    Mild persistent asthma, well controlled 10/21/2008   OSA (obstructive sleep apnea) 08/06/2008   Past Medical History:  Diagnosis Date   Anticoagulant long-term use    Aortic root enlargement ([place])    Ascending aortic aneurysm ([place])    recent scan in October 2012 showing no change; followed by Dr. Servando Snare   ASCVD (arteriosclerotic cardiovascular disease)    Prior BMS to the 2nd OM in September 2012; with repeat cath in October showing patency   CAD (coronary artery disease)    a. s/p BMS to 2nd OM in Sept 2012;  b. LexiScan Myoview (12/2012):  Inf infarct; bowel and motion artifact make study difficult to interpret; no ischemia; not gated; Low Risk   CHF (congestive heart failure) (HCC)    no recent issues 10/13/14   Chronic back pain    "all over my back" (05/11/2017)   Colonic polyp    Contact lens/glasses fitting    Diastolic dysfunction    DVT (deep venous thrombosis) ([place])    ?LLE   Frequent headaches    "probably weekly" (05/11/2017)   Generalized headaches    neck stenosis   GERD (gastroesophageal reflux disease)    Hearing loss    Hearing loss    more so on left   Hemorrhoids  History of stomach ulcers    Hypertension    IBS (irritable bowel syndrome)    LVH (left ventricular hypertrophy)    Mild intermittent asthma    OA (osteoarthritis)    "all over" (05/11/2017)   Obesities, morbid (HCC)    OSA (obstructive sleep apnea)    PSG 03/30/97 AHI 21, BPAP 13/9   OSA on CPAP    PAF (paroxysmal atrial fibrillation) ([place])    a. on Xarelto b. s/p DCCV in 08/2016; b. Tikosyn failed 04/16/17 with plans for Multaq and possible Afib ablation with Dr. Rayann Heman   Pneumonia    'several times" (05/11/2017)   Prostate CA [facility])    Oncologist  DR. Daralene Milch [facility] 09/24/14, undetermined tx   prostate; S/P "radiation and hormone injections"   Pulmonary embolism ([place]) 2008   "both lungs"   SOB (shortness of breath)    on excertion   Thoracic aortic aneurysm (HCC)    Aortic Size Index=     5.0    /Body surface area is 2.43 meters squared. = 2.05  < 2.75 cm/m2      4% risk per year 2.75 to 4.25          8% risk per year > 4.25 cm/m2    20% risk per year   Stable aneurysmal dilation of the ascending aorta with maximum AP diameter of 4.8 cm. Stable area of narrowing of the proximal most portion of the descending aorta measuring 2 cm., previously identified as an area of coarctation. No evidence of aortic dissection.  Coronary artery disease.  Normal appearance of the lungs.    Electronically Signed   By: Fidela Salisbury M.D.   On: 10/01/2014 08:50     Type II diabetes mellitus ([place])    metphormin, average 154 dx 2017    Family History  Problem Relation Age of Onset   Heart disease Mother    Diabetes Mother    Other Mother        stent placement   Emphysema Father 31   Heart attack Sister     Past Surgical History:  Procedure Laterality Date   ACHILLES TENDON REPAIR Bilateral    AORTIC ARCH ANGIOGRAPHY N/A 03/13/2017   Procedure: AORTIC ARCH ANGIOGRAPHY;  Surgeon: Martinique, [name], MD;  Location: [facility];  Service: Cardiovascular;  Laterality: N/A;   APPENDECTOMY     ATRIAL FIBRILLATION ABLATION  05/11/2017   ATRIAL FIBRILLATION ABLATION N/A 05/11/2017   Procedure: ATRIAL FIBRILLATION ABLATION;  Surgeon: [name], MD;  Location: [facility];  Service: Cardiovascular;  Laterality: N/A;   BACK SURGERY     "I've had 7 back and 1 neck ORs" (05/11/2017)   BIOPSY  03/14/2018   Procedure: BIOPSY;  Surgeon: Ronnette Juniper, MD;  Location: [facility];  Service: Gastroenterology;;  EGD and Colon   CARDIAC CATHETERIZATION  2006   CARPAL TUNNEL RELEASE Bilateral    LEFT   CATARACT EXTRACTION W/ INTRAOCULAR LENS  IMPLANT, BILATERAL Bilateral    CERVICAL SPINE SURGERY  06/02/2010   lower back and neck   COLONOSCOPY N/A 03/14/2018   Procedure: COLONOSCOPY;  Surgeon: Ronnette Juniper, MD;  Location: [facility];  Service: Gastroenterology;  Laterality: N/A;   COLONOSCOPY WITH PROPOFOL N/A 12/29/2014   Procedure: COLONOSCOPY WITH PROPOFOL;  Surgeon: Garlan Fair, MD;  Location: [facility];  Service: Endoscopy;  Laterality: N/A;   CORONARY ANGIOPLASTY WITH STENT PLACEMENT  October 2012   CORONARY STENT PLACEMENT  Sept 2012  2nd OM with BMS   ESOPHAGOGASTRODUODENOSCOPY N/A 03/14/2018   Procedure: ESOPHAGOGASTRODUODENOSCOPY (EGD);  Surgeon: Ronnette Juniper, MD;  Location: [facility];  Service: Gastroenterology;  Laterality: N/A;    HEMORROIDECTOMY     LAMINECTOMY  05/30/2012   L 4 L5   LAMINECTOMY WITH POSTERIOR LATERAL ARTHRODESIS LEVEL 3 N/A 10/18/2016   Procedure: Posterior Lateral Fusion - Lumbar One-Four, segmental instrumentation Lumbar One-Five,  decompression,;  Surgeon: Eustace Moore, MD;  Location: [facility];  Service: Neurosurgery;  Laterality: N/A;   LAPAROSCOPIC CHOLECYSTECTOMY     LAPAROSCOPIC GASTRIC BANDING     LEFT AND RIGHT HEART CATHETERIZATION WITH CORONARY ANGIOGRAM N/A 05/07/2014   Procedure: LEFT AND RIGHT HEART CATHETERIZATION WITH CORONARY ANGIOGRAM;  Surgeon: [name] Martinique, MD;  Location: [facility];  Service: Cardiovascular;  Laterality: N/A;   LEFT HEART CATH AND CORONARY ANGIOGRAPHY N/A 03/13/2017   Procedure: LEFT HEART CATH AND CORONARY ANGIOGRAPHY;  Surgeon: Martinique, [name], MD;  Location: [facility];  Service: Cardiovascular;  Laterality: N/A;   LUMBAR LAMINECTOMY/DECOMPRESSION MICRODISCECTOMY N/A 05/04/2016   Procedure: Laminectomy and Foraminotomy - Thoracic twelve-Lumbar one -Posterior Fusion Lumbar one-two;  Surgeon: Eustace Moore, MD;  Location: [facility];  Service: Neurosurgery;  Laterality: N/A;   POLYPECTOMY  03/14/2018   Procedure: POLYPECTOMY;  Surgeon: Ronnette Juniper, MD;  Location: [facility];  Service: Gastroenterology;;   POSTERIOR LUMBAR FUSION  10/18/2016   SHOULDER OPEN ROTATOR CUFF REPAIR Bilateral    TONSILLECTOMY AND ADENOIDECTOMY     TRIGGER FINGER RELEASE     LEFT   UVULOPALATOPHARYNGOPLASTY     VASECTOMY     Social History   Occupational History   Occupation: Retired from Scientist, clinical (histocompatibility and immunogenetics): RETIRED  Tobacco Use   Smoking status: Former Smoker    Packs/day: 1.50    Years: 30.00    Pack years: 45.00    Last attempt to quit: 01/24/1992    Years since quitting: 26.4   Smokeless tobacco: Never Used  Substance and Sexual Activity   Alcohol use: No   Drug use: No   Sexual activity: Not Currently

## 2018-06-20 ENCOUNTER — Other Ambulatory Visit: Payer: Self-pay | Admitting: *Deleted

## 2018-06-20 DIAGNOSIS — I712 Thoracic aortic aneurysm, without rupture, unspecified: Secondary | ICD-10-CM

## 2018-06-20 NOTE — Progress Notes (Unsigned)
Ct a 

## 2018-06-21 DIAGNOSIS — C61 Malignant neoplasm of prostate: Secondary | ICD-10-CM | POA: Diagnosis not present

## 2018-06-21 DIAGNOSIS — Z8546 Personal history of malignant neoplasm of prostate: Secondary | ICD-10-CM | POA: Diagnosis not present

## 2018-06-21 DIAGNOSIS — R972 Elevated prostate specific antigen [PSA]: Secondary | ICD-10-CM | POA: Diagnosis not present

## 2018-06-21 DIAGNOSIS — N528 Other male erectile dysfunction: Secondary | ICD-10-CM | POA: Diagnosis not present

## 2018-06-22 ENCOUNTER — Other Ambulatory Visit: Payer: Medicare Other

## 2018-06-22 ENCOUNTER — Telehealth: Payer: Self-pay | Admitting: Cardiology

## 2018-06-22 DIAGNOSIS — Z20822 Contact with and (suspected) exposure to covid-19: Secondary | ICD-10-CM

## 2018-06-22 NOTE — Telephone Encounter (Signed)
Spoke to patient regarding possible exposure during recent office visit.  Order place for Covid test

## 2018-06-24 LAB — NOVEL CORONAVIRUS, NAA: SARS-CoV-2, NAA: NOT DETECTED

## 2018-06-26 ENCOUNTER — Other Ambulatory Visit: Payer: Self-pay

## 2018-06-26 ENCOUNTER — Encounter (HOSPITAL_COMMUNITY): Payer: Self-pay | Admitting: Nurse Practitioner

## 2018-06-26 ENCOUNTER — Ambulatory Visit (HOSPITAL_COMMUNITY)
Admission: RE | Admit: 2018-06-26 | Discharge: 2018-06-26 | Disposition: A | Discharge status: Home / Self Care | Payer: Medicare Other | Source: Ambulatory Visit | Attending: Nurse Practitioner | Admitting: Nurse Practitioner

## 2018-06-26 VITALS — BP 121/71 | HR 59 | Ht 67.25 in | Wt 247.0 lb

## 2018-06-26 DIAGNOSIS — I480 Paroxysmal atrial fibrillation: Secondary | ICD-10-CM

## 2018-06-26 NOTE — Progress Notes (Signed)
Electrophysiology TeleHealth Note   Due to national recommendations of social distancing due to [place] 19, Audio/video telehealth visit is felt to be most appropriate for this patient at this time.  See MyChart message/consent below from today for patient consent regarding telehealth for the [facility].    Date:  06/26/2018   ID:  Carlos Dawson, DOB Apr 08, 1940, MRN [id number]  Location: home Provider location: [address] [place], [place] [address] Evaluation Performed: Follow up  PCP:  Josetta Huddle, MD  Primary Cardiologist:  Dr. Martinique Primary Electrophysiologist: Dr. Rayann Heman  CC:F/u afib   History of Present Illness: Carlos Dawson is a 78 y.o. male who presents via audio/video conferencing for a telehealth visit today.   The patient is referred for  f/u regarding PAF  by Dr Rayann Heman.  Pt reports that he is doing well. No afib that he is aware of but will note that his watch will show intermittent afb of short duration from time to time. He was at [facility] in early May and was in the hospital for SBP which spontaneously resolved after 2 days. He had a covid test there and was negative. He was recently in an orthopedic office in [place] recently and had to be tested for covid as one of their employees was positive, this test as well was negative.  Today, he denies symptoms of palpitations, chest pain, shortness of breath, orthopnea, PND, lower extremity edema, claudication, dizziness, presyncope, syncope, bleeding, or neurologic sequela. The patient is tolerating medications without difficulties and is otherwise without complaint today.   he denies symptoms of cough, fevers, chills, or new SOB worrisome for COVID 19.    he has a BMI of Body mass index is 38.4 kg/m.Marland Kitchen Filed Weights   06/26/18 0935  Weight: 112 kg    Past Medical History:  Diagnosis Date  . Anticoagulant long-term use   . Aortic root enlargement ([place])   . Ascending aortic aneurysm [facility])    recent  scan in October 2012 showing no change; followed by Dr. Servando Snare  . ASCVD (arteriosclerotic cardiovascular disease)    Prior BMS to the 2nd OM in September 2012; with repeat cath in October showing patency  . CAD (coronary artery disease)    a. s/p BMS to 2nd OM in Sept 2012; b. LexiScan Myoview (12/2012):  Inf infarct; bowel and motion artifact make study difficult to interpret; no ischemia; not gated; Low Risk  . CHF (congestive heart failure) ([place])    no recent issues 10/13/14  . Chronic back pain    "all over my back" (05/11/2017)  . Colonic polyp   . Contact lens/glasses fitting   . Diastolic dysfunction   . DVT (deep venous thrombosis) ([place])    ?LLE  . Frequent headaches    "probably weekly" (05/11/2017)  . Generalized headaches    neck stenosis  . GERD (gastroesophageal reflux disease)   . Hearing loss   . Hearing loss    more so on left  . Hemorrhoids   . History of stomach ulcers   . Hypertension   . IBS (irritable bowel syndrome)   . LVH (left ventricular hypertrophy)   . Mild intermittent asthma   . OA (osteoarthritis)    "all over" (05/11/2017)  . Obesities, morbid ([place])   . OSA (obstructive sleep apnea)    PSG 03/30/97 AHI 21, BPAP 13/9  . OSA on CPAP   . PAF (paroxysmal atrial fibrillation) ([place])    a. on  Xarelto b. s/p DCCV in 08/2016; b. Tikosyn failed 04/16/17 with plans for Multaq and possible Afib ablation with Dr. Rayann Heman  . Pneumonia    'several times" (05/11/2017)  . Prostate [facility])    Oncologist  DR. [facility] 09/24/14, undetermined tx   prostate; S/P "radiation and hormone injections"  . Pulmonary embolism ([place]) 2008   "both lungs"  . SOB (shortness of breath)    on excertion  . Thoracic aortic aneurysm ([facility])    Aortic Size Index=     5.0    /Body surface area is 2.43 meters squared. = 2.05  < 2.75 cm/m2      4% risk per year 2.75 to 4.25          8% risk per year > 4.25 cm/m2    20% risk per year   Stable aneurysmal dilation of the ascending  aorta with maximum AP diameter of 4.8 cm. Stable area of narrowing of the proximal most portion of the descending aorta measuring 2 cm., previously identified as an area of coarctation. No evidence of aortic dissection.  Coronary artery disease.  Normal appearance of the lungs.   Electronically Signed   By: Fidela Salisbury M.D.   On: 10/01/2014 08:50    . Type II diabetes mellitus ([place])    metphormin, average 154 dx 2017   Past Surgical History:  Procedure Laterality Date  . ACHILLES TENDON REPAIR Bilateral   . AORTIC ARCH ANGIOGRAPHY N/A 03/13/2017   Procedure: AORTIC ARCH ANGIOGRAPHY;  Surgeon: Martinique, [name], MD;  Location: [place] CV LAB;  Service: Cardiovascular;  Laterality: N/A;  . APPENDECTOMY    . ATRIAL FIBRILLATION ABLATION  05/11/2017  . ATRIAL FIBRILLATION ABLATION N/A 05/11/2017   Procedure: ATRIAL FIBRILLATION ABLATION;  Surgeon: [name], MD;  Location: [place] CV LAB;  Service: Cardiovascular;  Laterality: N/A;  . BACK SURGERY     "I've had 7 back and 1 neck ORs" (05/11/2017)  . BIOPSY  03/14/2018   Procedure: BIOPSY;  Surgeon: Ronnette Juniper, MD;  Location: [facility];  Service: Gastroenterology;;  EGD and Colon  . CARDIAC CATHETERIZATION  2006  . CARPAL TUNNEL RELEASE Bilateral    LEFT  . CATARACT EXTRACTION W/ INTRAOCULAR LENS  IMPLANT, BILATERAL Bilateral   . CERVICAL SPINE SURGERY  06/02/2010   lower back and neck  . COLONOSCOPY N/A 03/14/2018   Procedure: COLONOSCOPY;  Surgeon: Ronnette Juniper, MD;  Location: [facility];  Service: Gastroenterology;  Laterality: N/A;  . COLONOSCOPY WITH PROPOFOL N/A 12/29/2014   Procedure: COLONOSCOPY WITH PROPOFOL;  Surgeon: Garlan Fair, MD;  Location: [facility];  Service: Endoscopy;  Laterality: N/A;  . CORONARY ANGIOPLASTY WITH STENT PLACEMENT  October 2012  . CORONARY STENT PLACEMENT  Sept 2012   2nd OM with BMS  . ESOPHAGOGASTRODUODENOSCOPY N/A 03/14/2018   Procedure: ESOPHAGOGASTRODUODENOSCOPY (EGD);  Surgeon:  Ronnette Juniper, MD;  Location: [facility];  Service: Gastroenterology;  Laterality: N/A;  . HEMORROIDECTOMY    . LAMINECTOMY  05/30/2012   L 4 L5  . LAMINECTOMY WITH POSTERIOR LATERAL ARTHRODESIS LEVEL 3 N/A 10/18/2016   Procedure: Posterior Lateral Fusion - Lumbar One-Four, segmental instrumentation Lumbar One-Five,  decompression,;  Surgeon: Eustace Moore, MD;  Location: [facility] OR;  Service: Neurosurgery;  Laterality: N/A;  . LAPAROSCOPIC CHOLECYSTECTOMY    . LAPAROSCOPIC GASTRIC BANDING    . LEFT AND RIGHT HEART CATHETERIZATION WITH CORONARY ANGIOGRAM N/A 05/07/2014   Procedure: LEFT AND RIGHT HEART CATHETERIZATION WITH CORONARY ANGIOGRAM;  Surgeon: [name] Martinique, MD;  Location: [facility] CATH LAB;  Service: Cardiovascular;  Laterality: N/A;  . LEFT HEART CATH AND CORONARY ANGIOGRAPHY N/A 03/13/2017   Procedure: LEFT HEART CATH AND CORONARY ANGIOGRAPHY;  Surgeon: Martinique, [name], MD;  Location: [place] CV LAB;  Service: Cardiovascular;  Laterality: N/A;  . LUMBAR LAMINECTOMY/DECOMPRESSION MICRODISCECTOMY N/A 05/04/2016   Procedure: Laminectomy and Foraminotomy - Thoracic twelve-Lumbar one -Posterior Fusion Lumbar one-two;  Surgeon: Eustace Moore, MD;  Location: [place];  Service: Neurosurgery;  Laterality: N/A;  . POLYPECTOMY  03/14/2018   Procedure: POLYPECTOMY;  Surgeon: Ronnette Juniper, MD;  Location: [facility];  Service: Gastroenterology;;  . POSTERIOR LUMBAR FUSION  10/18/2016  . SHOULDER OPEN ROTATOR CUFF REPAIR Bilateral   . TONSILLECTOMY AND ADENOIDECTOMY    . TRIGGER FINGER RELEASE     LEFT  . UVULOPALATOPHARYNGOPLASTY    . VASECTOMY       Current Outpatient Medications  Medication Sig Dispense Refill  . acetaminophen (TYLENOL) 500 MG tablet Take 1,000 mg by mouth every 8 (eight) hours as needed for mild pain or headache.     . albuterol (PROVENTIL HFA;VENTOLIN HFA) 108 (90 Base) MCG/ACT inhaler Inhale 2 puffs into the lungs every 4 (four) hours as needed for wheezing or shortness of  breath.    Marland Kitchen atorvastatin (LIPITOR) 10 MG tablet Take 5 mg by mouth daily at 6 PM.     . augmented betamethasone dipropionate (DIPROLENE-AF) 0.05 % cream Apply 1 application topically 2 (two) times daily as needed for itching.  3  . budesonide-formoterol (SYMBICORT) 80-4.5 MCG/ACT inhaler Inhale 2 puffs into the lungs 2 (two) times daily as needed (for shortness of breath).     . Cholecalciferol (VITAMIN D-3) 5000 units TABS Take 5,000 Units by mouth daily.    . furosemide (LASIX) 80 MG tablet Take by mouth. Take 80 mg in the morning and 40 mg in the evening    . JARDIANCE 25 MG TABS tablet Take 1 tablet by mouth daily.    Marland Kitchen losartan (COZAAR) 25 MG tablet TAKE 1 TABLET BY MOUTH EVERY DAY 90 tablet 1  . methocarbamol (ROBAXIN) 500 MG tablet Take 1 tablet (500 mg total) by mouth every 8 (eight) hours as needed for muscle spasms. 60 tablet 1  . metoprolol tartrate (LOPRESSOR) 25 MG tablet Take 12.5 mg by mouth 2 (two) times daily.    . montelukast (SINGULAIR) 10 MG tablet Take 10 mg by mouth at bedtime.    . nitroGLYCERIN (NITROSTAT) 0.4 MG SL tablet Place 1 tablet (0.4 mg total) under the tongue every 5 (five) minutes as needed for chest pain. 25 tablet 5  . omeprazole (PRILOSEC) 20 MG capsule Take 20 mg by mouth daily.    Vladimir Faster Glycol-Propyl Glycol (SYSTANE OP) Place 1 drop into both eyes daily as needed (for dry eyes).     . potassium chloride SA (KLOR-CON M20) 20 MEQ tablet Take 2 tablets (40 mEq total) by mouth 2 (two) times daily. 360 tablet 2  . psyllium (METAMUCIL) 58.6 % packet Take 1 packet by mouth daily.    . rivaroxaban (XARELTO) 20 MG TABS tablet Take 1 tablet (20 mg total) by mouth daily with supper. 30 tablet   . sildenafil (VIAGRA) 100 MG tablet Take 100 mg by mouth as needed for erectile dysfunction.     . Skin Protectants, Misc. (EUCERIN) cream Apply 1 application topically as needed for dry skin.    Marland Kitchen spironolactone (ALDACTONE) 25 MG tablet Take  25 mg by mouth daily.    .  tamsulosin (FLOMAX) 0.4 MG CAPS Take 0.4 mg by mouth every evening.     . topiramate (TOPAMAX) 25 MG capsule Take 50 mg by mouth 2 (two) times daily.     . traMADol (ULTRAM) 50 MG tablet Take 50 mg by mouth every 12 (twelve) hours as needed for moderate pain.     . celecoxib (CELEBREX) 200 MG capsule Take 200 mg by mouth 2 (two) times daily as needed for moderate pain.  0   No current facility-administered medications for this encounter.     Allergies:   Ace inhibitors; Amoxicillin-pot clavulanate; Quinolones; Adhesive [tape]; Latex; and Morphine   Social History:  The patient  reports that he quit smoking about 26 years ago. He has a 45.00 pack-year smoking history. He has never used smokeless tobacco. He reports that he does not drink alcohol or use drugs.   Family History:  The patient's  family history includes Diabetes in his mother; Emphysema (age of onset: 75) in his father; Heart attack in his sister; Heart disease in his mother; Other in his mother.    ROS:  Please see the history of present illness.   All other systems are personally reviewed and negative.   Exam: Well appearing, alert and conversant, regular work of breathing,  good skin colo. Started out on video but had to change to phone for issues with pt's connectivity.  Recent Labs: No results found for requested labs within last 8760 hours.  personally reviewed    Other studies personally reviewed: Additional studies/ records that were reviewed today include: epic records   The patient presents wearable device technology report for my review today. Pt will send in one of his tracings     ASSESSMENT AND PLAN:  1.  Paroxysmal  atrial fibrillation Doing well staying in SR  No change in approach Continue with cpap No issues with xarelto     This patients CHA2DS2-VASc Score and unadjusted Ischemic Stroke Rate (% per year) is equal to 7.2 % stroke rate/year from a score of 5  Above score calculated as 1 point  each if present [CHF, HTN, DM, Vascular=MI/PAD/Aortic Plaque, Age if 65-74, or Male] Above score calculated as 2 points each if present [Age > 75, or Stroke/TIA/TE]  2. HTN Stable  3. CAD No anginal SYMPTOMS  COVID screen The patient does not have any symptoms that suggest any further testing/ screening at this time.  Social distancing reinforced today.    Follow-up: 6 months with Dr. Rayann Heman Dr. Martinique as recall  Current medicines are reviewed at length with the patient today.   The patient does not have concerns regarding his medicines.  The following changes were made today:  none  Labs/ tests ordered today include: none No orders of the defined types were placed in this encounter.   Patient Risk:  after full review of this patients clinical status, I feel that they are at moderate risk at this time.   Today, I have spent 15  minutes with the patient with telehealth technology discussing above  Signed, Roderic Palau NP  06/26/2018 10:35 AM  Afib [facility] [address] [place], [place] [address] [phone]   I hereby voluntarily request, consent and authorize the [facility] and its employed or contracted physicians, physician assistants, nurse practitioners or other licensed health care professionals (the Practitioner), to provide me with telemedicine health care services (the "Services") as deemed necessary  by the treating Practitioner. I acknowledge and consent to receive the Services by the Practitioner via telemedicine. I understand that the telemedicine visit will involve communicating with the Practitioner through live audiovisual communication technology and the disclosure of certain medical information by electronic transmission. I acknowledge that I have been given the opportunity to request an in-person assessment or other available alternative prior to the telemedicine visit and am voluntarily participating in the telemedicine  visit.   I understand that I have the right to withhold or withdraw my consent to the use of telemedicine in the course of my care at any time, without affecting my right to future care or treatment, and that the Practitioner or I may terminate the telemedicine visit at any time. I understand that I have the right to inspect all information obtained and/or recorded in the course of the telemedicine visit and may receive copies of available information for a reasonable fee.  I understand that some of the potential risks of receiving the Services via telemedicine include:   Delay or interruption in medical evaluation due to technological equipment failure or disruption;  Information transmitted may not be sufficient (e.g. poor resolution of images) to allow for appropriate medical decision making by the Practitioner; and/or  In rare instances, security protocols could fail, causing a breach of personal health information.   Furthermore, I acknowledge that it is my responsibility to provide information about my medical history, conditions and care that is complete and accurate to the best of my ability. I acknowledge that Practitioner's advice, recommendations, and/or decision may be based on factors not within their control, such as incomplete or inaccurate data provided by me or distortions of diagnostic images or specimens that may result from electronic transmissions. I understand that the practice of medicine is not an exact science and that Practitioner makes no warranties or guarantees regarding treatment outcomes. I acknowledge that I will receive a copy of this consent concurrently upon execution via email to the email address I last provided but may also request a printed copy by calling the office of the [facility].  I understand that my insurance will be billed for this visit.   I have read or had this consent read to me.  I understand the contents of this consent, which  adequately explains the benefits and risks of the Services being provided via telemedicine.  I have been provided ample opportunity to ask questions regarding this consent and the Services and have had my questions answered to my satisfaction.  I give my informed consent for the services to be provided through the use of telemedicine in my medical care  By participating in this telemedicine visit I agree to the above.

## 2018-06-27 DIAGNOSIS — I2510 Atherosclerotic heart disease of native coronary artery without angina pectoris: Secondary | ICD-10-CM | POA: Diagnosis not present

## 2018-06-27 DIAGNOSIS — D649 Anemia, unspecified: Secondary | ICD-10-CM | POA: Diagnosis not present

## 2018-06-27 DIAGNOSIS — E119 Type 2 diabetes mellitus without complications: Secondary | ICD-10-CM | POA: Diagnosis not present

## 2018-06-27 DIAGNOSIS — E785 Hyperlipidemia, unspecified: Secondary | ICD-10-CM | POA: Diagnosis not present

## 2018-06-27 DIAGNOSIS — J45909 Unspecified asthma, uncomplicated: Secondary | ICD-10-CM | POA: Diagnosis not present

## 2018-06-27 DIAGNOSIS — I509 Heart failure, unspecified: Secondary | ICD-10-CM | POA: Diagnosis not present

## 2018-06-27 DIAGNOSIS — I4891 Unspecified atrial fibrillation: Secondary | ICD-10-CM | POA: Diagnosis not present

## 2018-06-27 DIAGNOSIS — C61 Malignant neoplasm of prostate: Secondary | ICD-10-CM | POA: Diagnosis not present

## 2018-06-27 DIAGNOSIS — I5030 Unspecified diastolic (congestive) heart failure: Secondary | ICD-10-CM | POA: Diagnosis not present

## 2018-06-27 DIAGNOSIS — J4530 Mild persistent asthma, uncomplicated: Secondary | ICD-10-CM | POA: Diagnosis not present

## 2018-06-27 DIAGNOSIS — I10 Essential (primary) hypertension: Secondary | ICD-10-CM | POA: Diagnosis not present

## 2018-06-27 DIAGNOSIS — I480 Paroxysmal atrial fibrillation: Secondary | ICD-10-CM | POA: Diagnosis not present

## 2018-07-01 DIAGNOSIS — E119 Type 2 diabetes mellitus without complications: Secondary | ICD-10-CM | POA: Diagnosis not present

## 2018-08-01 DIAGNOSIS — M4802 Spinal stenosis, cervical region: Secondary | ICD-10-CM | POA: Diagnosis not present

## 2018-08-14 NOTE — Progress Notes (Signed)
[address]       [place], [address]             [phone]                   [facility] C Menzer [facility] Medical Record #[id number] Date of Birth: Jul 11, 1940  Referring: Martinique, [name], MD Primary Care: Josetta Huddle, MD  Chief Complaint:    Chief Complaint  Patient presents with   Thoracic Aortic Aneurysm     f/u with CTA Chest    History of Present Illness:    Patient returns today for followup CT scan 6 months after previous scan for evaluation of dilated ascending  Aorta. He  has known CAD with  a stent was placed in his circumflex coronary artery in the past.   His previous diagnosis of prostate cancer treatment at [facility].  Patient has significant underlying pulmonary disease.  He has had difficulty ambulating because of chronic back pain.  He is also on anticoagulation for atrial fibrillation.  He notes that he has had an A. fib ablation    a cardiopulmonary stress test was done, which demonstrated reduced O2 consumption, low maximal exercise, severely limited FEV1  The patient's had increasing hip pain and was scheduled for right hip replacement which has been delayed due to the [place] pandemic.  In the meantime the patient has sold his house is planning to temporarily move to [place].   cardiac catheterization in February 2019, no significant CAD was noted, no aortic valve insufficiency    Current Activity/ Functional Status: Patient is independent with mobility/ambulation, transfers, ADL's, IADL's.   Past Medical History  Diagnosis Date   Hypertension    Obesities, morbid    Pulmonary embolism     2008   Anticoagulant long-term use    CAD (coronary artery disease)     a. s/p BMS to 2nd OM in Sept 2012; b. LexiScan Myoview (12/2012):  Inf infarct; bowel and motion artifact make study difficult to interpret; no ischemia; not gated; Low Risk   OA (osteoarthritis)    LVH (left ventricular hypertrophy)     Colonic polyp    Mild intermittent asthma    Hemorrhoids    Generalized headaches    SOB (shortness of breath)     using oxygen with exercise   Diastolic dysfunction    ASCVD (arteriosclerotic cardiovascular disease)     Prior BMS to the 2nd OM in September 2012; with repeat cath in October showing patency   GERD (gastroesophageal reflux disease)    IBS (irritable bowel syndrome)    Aortic root enlargement            PAF (paroxysmal atrial fibrillation)     treated with Coumadin   Hearing loss    Contact lens/glasses fitting    OSA (obstructive sleep apnea)     PSG 03/30/97 AHI 21, BPAP 13/9   Sleep apnea    Prostate ca    Thoracic aortic aneurysm     Aortic Size Index=     5.0    /Body surface area is 2.43 meters squared. = 2.05  < 2.75 cm/m2      4% risk per year 2.75 to 4.25          8% risk per year > 4.25 cm/m2    20% risk per year   Stable aneurysmal dilation of the ascending aorta with maximum AP diameter  of 4.8 cm. Stable area of narrowing of the proximal most portion of the descending aorta measuring 2 cm., previously identified as an area of coarctation. No evidence of aortic dissection.  Coronary artery disease.  Normal appearance of the lungs.   Electronically Signed   By: Fidela Salisbury M.D.   On: 10/01/2014 08:50      Past Surgical History:  Procedure Laterality Date   ACHILLES TENDON REPAIR Bilateral    AORTIC ARCH ANGIOGRAPHY N/A 03/13/2017   Procedure: AORTIC ARCH ANGIOGRAPHY;  Surgeon: Martinique, [name], MD;  Location: [place] [facility];  Service: Cardiovascular;  Laterality: N/A;   APPENDECTOMY     ATRIAL FIBRILLATION ABLATION  05/11/2017   ATRIAL FIBRILLATION ABLATION N/A 05/11/2017   Procedure: ATRIAL FIBRILLATION ABLATION;  Surgeon: [name], MD;  Location: [place] [facility];  Service: Cardiovascular;  Laterality: N/A;   BACK SURGERY     "I've had 7 back and 1 neck ORs" (05/11/2017)   BIOPSY  03/14/2018   Procedure: BIOPSY;   Surgeon: Ronnette Juniper, MD;  Location: [facility];  Service: Gastroenterology;;  EGD and Colon   CARDIAC CATHETERIZATION  2006   CARPAL TUNNEL RELEASE Bilateral    LEFT   CATARACT EXTRACTION W/ INTRAOCULAR LENS  IMPLANT, BILATERAL Bilateral    CERVICAL SPINE SURGERY  06/02/2010   lower back and neck   COLONOSCOPY N/A 03/14/2018   Procedure: COLONOSCOPY;  Surgeon: Ronnette Juniper, MD;  Location: [facility];  Service: Gastroenterology;  Laterality: N/A;   COLONOSCOPY WITH PROPOFOL N/A 12/29/2014   Procedure: COLONOSCOPY WITH PROPOFOL;  Surgeon: Garlan Fair, MD;  Location: [facility];  Service: Endoscopy;  Laterality: N/A;   CORONARY ANGIOPLASTY WITH STENT PLACEMENT  October 2012   CORONARY STENT PLACEMENT  Sept 2012   2nd OM with BMS   ESOPHAGOGASTRODUODENOSCOPY N/A 03/14/2018   Procedure: ESOPHAGOGASTRODUODENOSCOPY (EGD);  Surgeon: Ronnette Juniper, MD;  Location: [facility];  Service: Gastroenterology;  Laterality: N/A;   HEMORROIDECTOMY     LAMINECTOMY  05/30/2012   L 4 L5   LAMINECTOMY WITH POSTERIOR LATERAL ARTHRODESIS LEVEL 3 N/A 10/18/2016   Procedure: Posterior Lateral Fusion - Lumbar One-Four, segmental instrumentation Lumbar One-Five,  decompression,;  Surgeon: Eustace Moore, MD;  Location: [place];  Service: Neurosurgery;  Laterality: N/A;   LAPAROSCOPIC CHOLECYSTECTOMY     LAPAROSCOPIC GASTRIC BANDING     LEFT AND RIGHT HEART CATHETERIZATION WITH CORONARY ANGIOGRAM N/A 05/07/2014   Procedure: LEFT AND RIGHT HEART CATHETERIZATION WITH CORONARY ANGIOGRAM;  Surgeon: [name] Martinique, MD;  Location: [facility];  Service: Cardiovascular;  Laterality: N/A;   LEFT HEART CATH AND CORONARY ANGIOGRAPHY N/A 03/13/2017   Procedure: LEFT HEART CATH AND CORONARY ANGIOGRAPHY;  Surgeon: Martinique, [name], MD;  Location: [place] [facility];  Service: Cardiovascular;  Laterality: N/A;   LUMBAR LAMINECTOMY/DECOMPRESSION MICRODISCECTOMY N/A 05/04/2016   Procedure: Laminectomy and Foraminotomy  - Thoracic twelve-Lumbar one -Posterior Fusion Lumbar one-two;  Surgeon: Eustace Moore, MD;  Location: [place];  Service: Neurosurgery;  Laterality: N/A;   POLYPECTOMY  03/14/2018   Procedure: POLYPECTOMY;  Surgeon: Ronnette Juniper, MD;  Location: [facility];  Service: Gastroenterology;;   POSTERIOR LUMBAR FUSION  10/18/2016   SHOULDER OPEN ROTATOR CUFF REPAIR Bilateral    TONSILLECTOMY AND ADENOIDECTOMY     TRIGGER FINGER RELEASE     LEFT   UVULOPALATOPHARYNGOPLASTY     VASECTOMY      Family History  Problem Relation Age of Onset   Heart disease Mother  Diabetes Mother    Other Mother        stent placement   Emphysema Father 60   Heart attack Sister     Social History   Socioeconomic History   Marital status: Married    Spouse name: Not on file   Number of children: 3   Years of education: Not on file   Highest education level: Not on file  Occupational History   Occupation: Retired from Scientist, clinical (histocompatibility and immunogenetics): [place] resource strain: Not on file   Food insecurity    Worry: Not on file    Inability: Not on file   Transportation needs    Medical: Not on file    Non-medical: Not on file  Tobacco Use   Smoking status: Former Smoker    Packs/day: 1.50    Years: 30.00    Pack years: 45.00    Quit date: 01/24/1992    Years since quitting: 26.5   Smokeless tobacco: Never Used  Substance and Sexual Activity   Alcohol use: No   Drug use: No   Sexual activity: Not Currently  Lifestyle   Physical activity    Days per week: Not on file    Minutes per session: Not on file   Stress: Not on file  Relationships   Social connections    Talks on phone: Not on file    Gets together: Not on file    Attends religious service: Not on file    Active member of club or organization: Not on file    Attends meetings of clubs or organizations: Not on file    Relationship status: Not on file   Intimate partner violence    Fear of  current or ex partner: Not on file    Emotionally abused: Not on file    Physically abused: Not on file    Forced sexual activity: Not on file  Other Topics Concern   Not on file  Social History Narrative   Not on file    Social History   Tobacco Use  Smoking Status Former Smoker   Packs/day: 1.50   Years: 30.00   Pack years: 109.00   Quit date: 01/24/1992   Years since quitting: 26.5  Smokeless Tobacco Never Used    Social History   Substance and Sexual Activity  Alcohol Use No     Allergies  Allergen Reactions   Ace Inhibitors Cough   Amoxicillin-Pot Clavulanate     Other reaction(s): GI Upset (intolerance)   Quinolones     Patient was warned about not using Cipro and similar antibiotics. Recent studies have raised concern that fluoroquinolone antibiotics could be associated with an increased risk of aortic aneurysm Fluoroquinolones have non-antimicrobial properties that might jeopardise the integrity of the extracellular matrix of the vascular wall In a  propensity score matched cohort study in [place], there was a 66% increased rate of aortic aneurysm or dissection associated with oral fluoroquinolone use, compared wit   Adhesive [Tape] Itching and Rash   Latex Itching, Rash and Other (See Comments)    Bandaids   Morphine Itching    Current Outpatient Medications  Medication Sig Dispense Refill   acetaminophen (TYLENOL) 500 MG tablet Take 1,000 mg by mouth every 8 (eight) hours as needed for mild pain or headache.      albuterol (PROVENTIL HFA;VENTOLIN HFA) 108 (90 Base) MCG/ACT inhaler Inhale 2 puffs into the lungs every 4 (four) hours as needed  for wheezing or shortness of breath.     atorvastatin (LIPITOR) 10 MG tablet Take 5 mg by mouth daily at 6 PM.      augmented betamethasone dipropionate (DIPROLENE-AF) 0.05 % cream Apply 1 application topically 2 (two) times daily as needed for itching.  3   budesonide-formoterol (SYMBICORT) 80-4.5 MCG/ACT  inhaler Inhale 2 puffs into the lungs 2 (two) times daily as needed (for shortness of breath).      celecoxib (CELEBREX) 200 MG capsule Take 200 mg by mouth 2 (two) times daily as needed for moderate pain.  0   Cholecalciferol (VITAMIN D-3) 5000 units TABS Take 5,000 Units by mouth daily.     furosemide (LASIX) 80 MG tablet Take by mouth. Take 80 mg in the morning and 40 mg in the evening     JARDIANCE 25 MG TABS tablet Take 1 tablet by mouth daily.     losartan (COZAAR) 25 MG tablet TAKE 1 TABLET BY MOUTH EVERY DAY 90 tablet 1   methocarbamol (ROBAXIN) 500 MG tablet Take 1 tablet (500 mg total) by mouth every 8 (eight) hours as needed for muscle spasms. 60 tablet 1   metoprolol tartrate (LOPRESSOR) 25 MG tablet Take 12.5 mg by mouth 2 (two) times daily.     montelukast (SINGULAIR) 10 MG tablet Take 10 mg by mouth at bedtime.     nitroGLYCERIN (NITROSTAT) 0.4 MG SL tablet Place 1 tablet (0.4 mg total) under the tongue every 5 (five) minutes as needed for chest pain. 25 tablet 5   omeprazole (PRILOSEC) 20 MG capsule Take 20 mg by mouth daily.     Polyethyl Glycol-Propyl Glycol (SYSTANE OP) Place 1 drop into both eyes daily as needed (for dry eyes).      potassium chloride SA (KLOR-CON M20) 20 MEQ tablet Take 2 tablets (40 mEq total) by mouth 2 (two) times daily. 360 tablet 2   psyllium (METAMUCIL) 58.6 % packet Take 1 packet by mouth daily.     rivaroxaban (XARELTO) 20 MG TABS tablet Take 1 tablet (20 mg total) by mouth daily with supper. 30 tablet    sildenafil (VIAGRA) 100 MG tablet Take 100 mg by mouth as needed for erectile dysfunction.      Skin Protectants, Misc. (EUCERIN) cream Apply 1 application topically as needed for dry skin.     spironolactone (ALDACTONE) 25 MG tablet Take 25 mg by mouth daily.     tamsulosin (FLOMAX) 0.4 MG CAPS Take 0.4 mg by mouth every evening.      topiramate (TOPAMAX) 25 MG capsule Take 50 mg by mouth 2 (two) times daily.      traMADol  (ULTRAM) 50 MG tablet Take 50 mg by mouth every 12 (twelve) hours as needed for moderate pain.      No current facility-administered medications for this visit.        Review of Systems:     Review of Systems:     Cardiac Review of Systems: Y or N  Chest Pain [  y ]  Resting SOB Blue.Reese  ] Exertional SOB  [y]  Orthopnea Blue.Reese ]   Pedal Edema [ y    Palpitations [ y ] Syncope  [ n ]   Presyncope [n ]  General Review of Systems: [Y] = yes [  ]=no Constitional: recent weight change [  ]; anorexia [  ]; fatigue [  ]; nausea [  ]; night sweats [  ]; fever [  ]; or chills [  ];  Dental: poor dentition[  ];   Eye : blurred vision [  ]; diplopia [   ]; vision changes [  ];  Amaurosis fugax[  ]; Resp: cough [  ];  wheezing[  ];  hemoptysis[  ]; shortness of breath[Y  ]; paroxysmal nocturnal dyspnea[Y  ]; dyspnea on exertion[y ]; or orthopnea[  ];  GI:  gallstones[  ], vomiting[  ];  dysphagia[  ]; melena[  ];  hematochezia [  ]; heartburn[  ];   Hx of  Colonoscopy[  ]; GU: kidney stones [  ]; hematuria[  ];   dysuria [  ];  nocturia[  ];  history of     obstruction [  ];                 Skin: rash, swelling[  ];, hair loss[  ];  peripheral edema[  ];  or itching[  ]; Musculosketetal: myalgias[  ];  joint swelling[  ];  joint erythema[  ];  joint pain[  ];  back pain[  ];  Heme/Lymph: bruising[  ];  bleeding[  ];  anemia[  ];  Neuro: TIA[  ];  headaches[  ];  stroke[  ];  vertigo[  ];  seizures[  ];   paresthesias[  ];  difficulty walking[Y ];  Psych:depression[  ]; anxiety[  ];  Endocrine: diabetes[  ];  thyroid dysfunction[  ];  Immunizations: Flu [  Y]; Pneumococcal[Y  ];  Other:    Physical Exam: BP 122/66    Pulse 69    Temp 97.6 F (36.4 C) (Skin)    Resp 20    Ht 5' 7.25" (1.708 m)    Wt 243 lb (110.2 kg)    SpO2 92% Comment: RA   BMI 37.78 kg/m   General  appearance: alert, cooperative, appears older than stated age and no distress Neck: no adenopathy, no carotid bruit, no JVD, supple, symmetrical, trachea midline and thyroid not enlarged, symmetric, no tenderness/mass/nodules Lymph nodes: Cervical, supraclavicular, and axillary nodes normal. Resp: diminished breath sounds bibasilar Cardio: regular rate and rhythm, S1, S2 normal, no murmur, click, rub or gallop GI: soft, non-tender; bowel sounds normal; no masses,  no organomegaly Extremities: edema Mild bilateral lower extremity edema mild bilateral lower extremity edema Neurologic: Grossly normal  Diagnostic Studies & Laboratory data:     Recent Radiology Findings:   Ct Angio Chest Aorta W &/or Wo Contrast  Result Date: 08/15/2018 CLINICAL DATA:  Thoracic aortic aneurysm without rupture. EXAM: CT ANGIOGRAPHY CHEST WITH CONTRAST TECHNIQUE: Multidetector CT imaging of the chest was performed using the standard protocol during bolus administration of intravenous contrast. Multiplanar CT image reconstructions and MIPs were obtained to evaluate the vascular anatomy. CONTRAST:  65mL ISOVUE-370 IOPAMIDOL (ISOVUE-370) INJECTION 76% COMPARISON:  CT scan of December 06, 2017. FINDINGS: Cardiovascular: Grossly stable 4.8 cm ascending thoracic aortic aneurysm is noted. No dissection is noted. Great vessels are widely patent without significant stenosis. Mild cardiomegaly. No pericardial effusion. Mediastinum/Nodes: No enlarged mediastinal, hilar, or axillary lymph nodes. Thyroid gland, trachea, and esophagus demonstrate no significant findings. Lungs/Pleura: Lungs are clear. No pleural effusion or pneumothorax. Upper Abdomen: No acute abnormality. Musculoskeletal: No chest wall abnormality. No acute or significant osseous findings. Review of the MIP images confirms the above findings. IMPRESSION: Grossly stable 4.8 cm ascending thoracic aortic aneurysm. Ascending thoracic aortic aneurysm. Recommend semi-annual  imaging followup by CTA or MRA and referral to cardiothoracic surgery if not already obtained. This recommendation follows 2010 ACCF/AHA/AATS/ACR/ASA/SCA/SCAI/SIR/STS/SVM Guidelines  for the Diagnosis and Management of Patients With Thoracic Aortic Disease. Circulation. 2010; 121: E703-J009. Aortic aneurysm NOS (ICD10-I71.9). Electronically Signed   By: Marijo Conception M.D.   On: 08/15/2018 09:18     I have independently reviewed the above radiology studies  and reviewed the findings with the patient.   Ct Angio Chest Aorta W &/or Wo Contrast  Result Date: 12/06/2017 CLINICAL DATA:  Thoracic aortic aneurysm follow-up EXAM: CT ANGIOGRAPHY CHEST WITH CONTRAST TECHNIQUE: Multidetector CT imaging of the chest was performed using the standard protocol during bolus administration of intravenous contrast. Multiplanar CT image reconstructions and MIPs were obtained to evaluate the vascular anatomy. CONTRAST:  70mL ISOVUE-370 IOPAMIDOL (ISOVUE-370) INJECTION 76% COMPARISON:  05/07/2017 FINDINGS: Cardiovascular: Maximal diameter of the ascending aorta is 4.9 cm. Previously, diameter was 5.0 cm. There is no evidence of aortic dissection or acute intramural hematoma. Mild LAD and circumflex coronary artery calcification. Mild atherosclerotic calcification of the aortic arch and descending thoracic aorta. Great vessels are patent. There is no obvious evidence of acute pulmonary thromboembolism. Mediastinum/Nodes: There is no abnormal mediastinal adenopathy. Visualized thyroid and esophagus are within normal limits. No pericardial effusion. Lungs/Pleura: No pneumothorax, pleural effusion, or pulmonary nodule. Upper Abdomen: Gastric banding apparatus is in place. Postcholecystectomy. Musculoskeletal: No vertebral compression deformity. Status post L2-3 decompression and posterior fusion. Review of the MIP images confirms the above findings. IMPRESSION: Stable aneurysmal dilatation of the ascending aorta at 4.9 cm. No  evidence of dissection or intramural hematoma. Ascending thoracic aortic aneurysm. Recommend semi-annual imaging followup by CTA or MRA and referral to cardiothoracic surgery if not already obtained. This recommendation follows 2010 ACCF/AHA/AATS/ACR/ASA/SCA/SCAI/SIR/STS/SVM Guidelines for the Diagnosis and Management of Patients With Thoracic Aortic Disease. Circulation. 2010; 121: F818-E993 Aortic Atherosclerosis (ICD10-I70.0). Electronically Signed   By: Marybelle Killings M.D.   On: 12/06/2017 10:17   ADDENDUM REPORT: 05/08/2017 07:11  EXAM: OVER-READ INTERPRETATION  CT CHEST  The following report is an over-read performed by radiologist Dr. Collene Leyden [facility] on 05/08/2017. This over-read does not include interpretation of cardiac or coronary anatomy or pathology. The coronary CTA interpretation by the cardiologist is attached.  COMPARISON:  09/15/2016  FINDINGS: Aneurysmal dilatation of the ascending thoracic aorta, 5 cm maximally, stable since prior study. Mild cardiomegaly. No adenopathy in the lower mediastinum or hila. Areas of scarring in the lingula, right middle lobe and right lower lobe. No effusions.  Imaging into the upper abdomen shows no acute findings. Bilateral gynecomastia. No acute bony abnormality.  IMPRESSION: 5 cm ascending thoracic aortic aneurysm. This is stable since August 2018. Recommend semi-annual imaging followup by CTA or MRA and referral to cardiothoracic surgery if not already obtained. This recommendation follows 2010 ACCF/AHA/AATS/ACR/ASA/SCA/SCAI/SIR/STS/SVM Guidelines for the Diagnosis and Management of Patients With Thoracic Aortic Disease. Circulation. 2010; 121: Z169-C789   Electronically Signed   By: Rolm Baptise M.D.   On: 05/08/2017 07:11    CLINICAL DATA:  Chest pain x2 days, follow-up known thoracic aortic aneurysm  EXAM: CT ANGIOGRAPHY CHEST WITH CONTRAST  TECHNIQUE: Multidetector CT imaging of the  chest was performed using the standard protocol during bolus administration of intravenous contrast. Multiplanar CT image reconstructions and MIPs were obtained to evaluate the vascular anatomy.  CONTRAST:  90 mL Isovue 370 IV  COMPARISON:  10/14/2015  FINDINGS: Cardiovascular: Preferential opacification of the thoracic aorta. No evidence of aortic dissection.  Stable ascending thoracic aortic aneurysm, measuring 5.0 cm (series 5/ image 53). Mild atherosclerotic calcifications.  Although not tailored  for evaluation of the pulmonary arteries, there is no evidence of pulmonary embolism to the lobar level.  Heart is top-normal in size.  No pericardial effusion.  Mild coronary atherosclerosis of the LAD and left circumflex.  Mediastinum/Nodes: No suspicious mediastinal lymphadenopathy.  Visualized thyroid is unremarkable.  Lungs/Pleura: No suspicious pulmonary nodules.  No focal consolidation.  Mild dependent atelectasis in the bilateral lower lobes.  No pleural effusion or pneumothorax.  Upper Abdomen: Visualized upper abdomen is notable for mild hepatic steatosis and cholecystectomy clips.  Musculoskeletal: Degenerative changes of the visualized thoracolumbar spine.  Review of the MIP images confirms the above findings.  IMPRESSION:Stable 5.0 cm ascending thoracic aortic aneurysm. No evidence of aortic dissection.  Recommend semi-annual imaging followup by CTA or MRA and referral to cardiothoracic surgery if not already obtained. This recommendation follows 2010 ACCF/AHA/AATS/ACR/ASA/SCA/SCAI/SIR/STS/SVM Guidelines for the Diagnosis and Management of Patients With Thoracic Aortic Disease. Circulation. 2010; 121: O242-P536  No evidence of acute cardiopulmonary disease.  Aortic Atherosclerosis (ICD10-I70.0).   Electronically Signed   By: Julian Hy M.D.   On: 09/15/2016 01:33     CLINICAL DATA:  Followup ascending aortic  dilatation  EXAM: CT ANGIOGRAPHY CHEST WITH CONTRAST  TECHNIQUE: Multidetector CT imaging of the chest was performed using the standard protocol during bolus administration of intravenous contrast. Multiplanar CT image reconstructions and MIPs were obtained to evaluate the vascular anatomy.  CONTRAST:  75 mL Isovue 370.  Creatinine was obtained on site at [facility] at [address]Results: Creatinine 1.2 mg/dL.  COMPARISON:  09/01/2014  FINDINGS: Cardiovascular: Mild aortic calcifications are noted. Dilatation of the ascending aorta is again identified to 5 cm. Measurements are somewhat limited by patient motion artifact. Sino-tubular junction measures 4.6 cm. The aorta at the level of sinus Valsalva measures 5.5 cm. These measurements are stable from the prior exam. Normal distal tapering is seen. No dissection is identified. Bovine arch is again identified.  Coronary calcifications are seen similar to that seen on the prior exam. The pulmonary artery as visualized is within normal limits.  Mediastinum/Nodes: Thoracic inlet is within normal limits. No significant hilar or mediastinal adenopathy is identified. No axillary adenopathy is seen. The esophagus as visualize is within normal limits.  Lungs/Pleura: Mild dependent atelectatic changes are noted. No focal infiltrate or focal nodules are seen. No sizable effusion or pneumothorax is noted.  Upper Abdomen: Gastric lap band is seen in satisfactory position. The remainder the upper abdomen is within normal limits.  Musculoskeletal: Degenerative changes of the thoracic spine are noted.  Review of the MIP images confirms the above findings.  IMPRESSION: Dilatation of the ascending aorta to 5 cm. Given some slight variation in the imaging technique, this is felt to be relatively stable from the prior exam. Ascending thoracic aortic aneurysm. Recommend semi-annual imaging followup by CTA or  MRA and referral to cardiothoracic surgery if not already obtained. This recommendation follows 2010 ACCF/AHA/AATS/ACR/ASA/SCA/SCAI/SIR/STS/SVM Guidelines for the Diagnosis and Management of Patients With Thoracic Aortic Disease. Circulation. 2010; 121: R443-X540  Otherwise stable appearance of the chest when compared with the prior exam.   Electronically Signed   By: Inez Catalina M.D.   On: 10/14/2015 14:11  Ct Angio Chest Aorta W/cm &/or Wo/cm  10/01/2014   CLINICAL DATA:  Follow up thoracic aortic aneurysm. History of stent for blockage. Recently diagnosed prostate cancer.  EXAM: CT ANGIOGRAPHY CHEST WITH CONTRAST  TECHNIQUE: Multidetector CT imaging of the chest was performed using the standard protocol during bolus administration of  intravenous contrast. Multiplanar CT image reconstructions and MIPs were obtained to evaluate the vascular anatomy.  CONTRAST:  75 cc Isovue 370.  COMPARISON:  09/11/2013  FINDINGS: There stable anulus mild dilation of the ascending aorta with maximum AP diameter of 4.8 cm. The diameter of the thoracic aorta at the arch is 4.4 cm. There is also a stable narrowing of the proximal descending aorta, downstream to the takeoff of the left subclavian artery, measuring 2 cm., as previously identified as possible area of coarctation. The remaining of the descending thoracic aorta is torturous but normal in caliber. There is bovine arch anatomic variation.  There is no evidence of pulmonary embolus.  The heart is normal in size. Conventional coronary artery anatomy is seen with atherosclerotic disease involving mostly the left system. There is no evidence of significant focal lung parenchymal consolidation, pleural effusion or pneumothorax. No masses are identified ; no abnormal focal contrast enhancement is seen.  No mediastinal or axillary lymphadenopathy is seen. The visualized portions of the thyroid gland are unremarkable in appearance.  The visualized portions of the  liver and spleen are unremarkable.  The visualized portions of the pancreas, stomach, adrenal glands and kidneys are within normal limits. There has been a prior cholecystectomy.  No acute osseous abnormalities are seen. Thoracic spine diffuse idiopathic skeletal hyperostosis is noted.  Review of the MIP images confirms the above findings.  IMPRESSION: Stable aneurysmal dilation of the ascending aorta with maximum AP diameter of 4.8 cm. Stable area of narrowing of the proximal most portion of the descending aorta measuring 2 cm., previously identified as an area of coarctation. No evidence of aortic dissection.  Coronary artery disease.  Normal appearance of the lungs.   Electronically Signed   By: Fidela Salisbury M.D.   On: 10/01/2014 08:50   Dg Chest 2 View  08/21/2013   CLINICAL DATA:  Followup of pneumonia. Productive cough. Congestion. Asthma. Ex-smoker.  EXAM: CHEST  2 VIEW  COMPARISON:  08/12/2013 and baseline radiograph of 05/21/2013  FINDINGS: Moderate thoracic spondylosis. Midline trachea. Moderate cardiomegaly. Pulmonary artery enlargement. Mediastinal contours otherwise within normal limits. No pleural effusion or pneumothorax. Lower lobe predominant interstitial thickening. Clearing of right lower lobe pneumonia.  IMPRESSION: Clearing of right lower lobe airspace disease/pneumonia.  Cardiomegaly with chronic interstitial thickening, likely related to smoking or chronic bronchitis.  Pulmonary artery enlargement suggests pulmonary arterial hypertension.   Electronically Signed   By: Abigail Miyamoto M.D.   On: 08/21/2013 09:47   Ct Angio Chest Aorta W/cm &/or Wo/cm  09/11/2013   CLINICAL DATA:  Thoracic aortic aneurysm.  EXAM: CT ANGIOGRAPHY CHEST WITH CONTRAST  TECHNIQUE: Multidetector CT imaging of the chest was performed using the standard protocol during bolus administration of intravenous contrast. Multiplanar CT image reconstructions and MIPs were obtained to evaluate the vascular anatomy.   CONTRAST:  52mL OMNIPAQUE IOHEXOL 350 MG/ML SOLN  COMPARISON:  01/06/2013 and 09/14/2011  FINDINGS: Lungs are adequately inflated without consolidation effusion. There is minimal linear scarring over the anterior right upper lobe unchanged. 4 mm nodular density adjacent the minor fissure unchanged from 2013. There is borderline cardiomegaly. Continued minimal calcification over the left main, lateral circumflex and anterior descending coronary arteries. No change in a right subcarinal lymph node measuring 1 cm by short axis.  There is continued evidence of aneurysmal dilatation of the ascending thoracic aorta which measures 4.8 cm in greatest AP diameter proximally which is unchanged. The thoracic aorta measures 4 cm in diameter at the level  of the proximal arch without significant change. There is a relative area of narrowing of the posterior aortic arch 1.9 cm distal to the take-off of the left subclavian artery as the aorta measures 2.1 cm in diameter focally and increases in diameter to 3.2 cm immediately distal to this narrowing. Main pulmonary artery measures 4 cm in transverse diameter unchanged. Remaining mediastinal structures are unremarkable.  Images through the upper abdomen demonstrate evidence of prior cholecystectomy as well as surgical change over the region of the gastroesophageal junction with lap band apparatus in place. There are degenerative changes of the spine.  Review of the MIP images confirms the above findings.  IMPRESSION: Stable aneurysmal dilatation of the ascending thoracic aorta measuring 4.8 cm in AP diameter. Note that there is a focal relative area of narrowing of the aortic distal to the take-off of the left subclavian artery measuring 2.1 cm in diameter likely representing a degree of coarctation unchanged. No evidence of dissection.  Stable borderline cardiomegaly.  Postsurgical changes as described.   Electronically Signed   By: Marin Olp M.D.   On: 09/11/2013 11:07     Recent Lab Findings: Lab Results  Component Value Date   WBC 6.5 04/30/2017   HGB 10.6 (L) 04/30/2017   HCT 33.4 (L) 04/30/2017   PLT 128 (L) 04/30/2017   GLUCOSE 154 (H) 04/30/2017   CHOL 118 01/07/2013   TRIG 133 01/07/2013   HDL 40 01/07/2013   LDLCALC 51 01/07/2013   ALT 19 03/15/2016   AST 25 03/15/2016   NA 143 04/30/2017   K 4.2 04/30/2017   CL 105 04/30/2017   CREATININE 1.54 (H) 04/30/2017   BUN 23 04/30/2017   CO2 24 04/30/2017   TSH 1.29 05/11/2015   INR 1.3 (H) 03/08/2017   HGBA1C 7.4 (H) 10/16/2016   Aortic Size Index=    5.0   /Body surface area is 2.29 meters squared. =2.12 < 2.75 cm/m2      4% risk per year 2.75 to 4.25          8% risk per year > 4.25 cm/m2    20% risk per year  Cardiac cath February 2019: Procedures   AORTIC ARCH ANGIOGRAPHY  LEFT HEART CATH AND CORONARY ANGIOGRAPHY  Conclusion     Previously placed Ost 2nd Mrg to 2nd Mrg stent (unknown type) is widely patent.  There is no aortic valve regurgitation.   1. No significant obstructive CAD 2. Aneurysmal thoracic aorta.   Plan: continue medical management . Consider pulmonary evaluation for symptoms of dyspnea.    Indications   Chest pain, unspecified type [R07.9 (ICD-10-CM)]  Procedural Details/Technique   Technical Details Indication: 78 yo WM S/p stenting of OM with BMS in 2012. Presents with atypical chest pain and dyspnea. Abnormal Myoview with inferior ischemia  Procedural Details: The left wrist was prepped, draped, and anesthetized with 1% lidocaine. Using the modified Seldinger technique, a 6 French slender sheath was introduced into the left radial artery. 3 mg of verapamil was administered through the sheath, weight-based unfractionated heparin was administered intravenously. Standard Judkins catheters were used for selective coronary angiography and aortography. Unable to cross the AV due to distortion of the aorta. A LA 1 catheter was used to engage the RCA. A JL6  catheter was used to engage the LCA. Catheter exchanges were performed over an exchange length guidewire. There were no immediate procedural complications. A TR band was used for radial hemostasis at the completion of the procedure. The patient  was transferred to the post catheterization recovery area for further monitoring.  Contrast: 97 cc   Estimated blood loss <50 mL.  During this procedure the patient was administered the following to achieve and maintain moderate conscious sedation: Versed 1 mg, Fentanyl 25 mcg, while the patient's heart rate, blood pressure, and oxygen saturation were continuously monitored. The period of conscious sedation was 24 minutes, of which I was present face-to-face 100% of this time.  Complications   Complications documented before study signed (03/13/2017 11:35 AM EST)    No complications were associated with this study.  Documented by Martinique, [name], MD - 03/13/2017 11:33 AM EST    Coronary Findings   Diagnostic  Dominance: Left  Left Main  Vessel was injected. Vessel is normal in caliber. Vessel is angiographically normal. The vessel originates from a separate ostium.  Left Anterior Descending  Vessel was injected. Vessel is small. Vessel is angiographically normal. There is mild focal disease in the vessel.  Second Septal Branch  Left Circumflex  Vessel was injected. Vessel is large. Vessel is angiographically normal.  Second Obtuse Marginal Branch  Ost 2nd Mrg to 2nd Mrg lesion 0% stenosed  Previously placed Ost 2nd Mrg to 2nd Mrg stent (unknown type) is widely patent.  Right Coronary Artery  Vessel was injected. Vessel is small. Vessel is angiographically normal.  Intervention   No interventions have been documented.  Left Heart   Aorta Aortic Root: The aortic root is aneurysmal and is enlarged. There is no aortic valve regurgitation.  Coronary Diagrams   Diagnostic Diagram        Echocardiogram February  2019: Echocardiography  Patient:    Garrison, Michie MR #:       [id number] Study Date: 03/07/2017 Gender:     M Age:        29 Height:     170.2 cm Weight:     122.9 kg BSA:        2.47 m^2 Pt. Status: Room:   SONOGRAPHER  Victorio Palm, RDCS  ATTENDING    Jory Sims M  ORDERING     Jory Sims M  REFERRING    Jory Sims M  PERFORMING   [facility] Outpatient  cc:  ------------------------------------------------------------------- LV EF: 55% -   60%  ------------------------------------------------------------------- Indications:      (I25.10). GLS -21.5  ------------------------------------------------------------------- History:   PMH:  Acquired from the patient and from the patient&'s chart.  Chest pressure.  Dyspnea.  Paroxysmal atrial fibrillation. Coronary artery disease.  Congestive heart failure.  Aortic aneurysm.  Risk factors:  Hypertension. Diabetes mellitus. Morbidly obese. Dyslipidemia.  ------------------------------------------------------------------- Study Conclusions  - Left ventricle: The cavity size was normal. Wall thickness was   increased in a pattern of mild LVH. Systolic function was normal.   The estimated ejection fraction was in the range of 55% to 60%.   Wall motion was normal; there were no regional wall motion   abnormalities. Left ventricular diastolic function parameters   were normal. - Aortic valve: There was very mild stenosis. Valve area (VTI):   1.81 cm^2. Valve area (Vmax): 1.94 cm^2. Valve area (Vmean): 1.85   cm^2. - Right atrium: The atrium was mildly dilated. - Atrial septum: No defect or patent foramen ovale was identified. - Pulmonary arteries: PA peak pressure: 54 mm Hg (S).  ------------------------------------------------------------------- Labs, prior tests, procedures, and surgery: Echocardiography (April 2016).     EF was 55%.  Catheterization (2012).    The study demonstrated coronary  artery disease. There was a stenosis in the 2nd obtuse marginal artery which was treated with a bare-metal stent. Cardioversion.  ------------------------------------------------------------------- Study data:  Comparison was made to the study of 2016.  Study status:  Routine.  Procedure:  The patient reported no pain pre or post test. Transthoracic echocardiography for diagnosis, for left ventricular function evaluation, for right ventricular function evaluation, and for assessment of valvular function. Image quality was adequate.  Study completion:  There were no complications.     Echocardiography.  M-mode, complete 2D, spectral Doppler, and color Doppler.  Birthdate:  Patient birthdate: [birth year].  Age: Patient is 78 yr old.  Sex:  Gender: male.    BMI: 42.4 kg/m^2. Blood pressure:     102/56  Patient status:  Outpatient.  Study date:  Study date: 03/07/2017. Study time: 08:39 AM.  Location: [facility]  -------------------------------------------------------------------  ------------------------------------------------------------------- Left ventricle:  The cavity size was normal. Wall thickness was increased in a pattern of mild LVH. Systolic function was normal. The estimated ejection fraction was in the range of 55% to 60%. Wall motion was normal; there were no regional wall motion abnormalities. The transmitral flow pattern was normal. The deceleration time of the early transmitral flow velocity was normal. The pulmonary vein flow pattern was normal. The tissue Doppler parameters were normal. Left ventricular diastolic function parameters were normal.  ------------------------------------------------------------------- Aortic valve:   Probably trileaflet; mildly thickened, mildly calcified leaflets. Mobility was not restricted.  Doppler:   There was very mild stenosis.   There was no regurgitation.    VTI ratio of LVOT to aortic valve: 0.48. Valve area (VTI):  1.81 cm^2. Indexed valve area (VTI): 0.73 cm^2/m^2. Peak velocity ratio of LVOT to aortic valve: 0.51. Valve area (Vmax): 1.94 cm^2. Indexed valve area (Vmax): 0.78 cm^2/m^2. Mean velocity ratio of LVOT to aortic valve: 0.49. Valve area (Vmean): 1.85 cm^2. Indexed valve area (Vmean): 0.75 cm^2/m^2.    Mean gradient (S): 9 mm Hg. Peak gradient (S): 17 mm Hg.  ------------------------------------------------------------------- Aorta:  The aorta was moderately dilated. Aortic root: The aortic root was normal in size. Ascending aorta: The ascending aorta was mildly dilated.  ------------------------------------------------------------------- Mitral valve:   Mildly thickened leaflets . Mobility was not restricted.  Doppler:  Transvalvular velocity was within the normal range. There was no evidence for stenosis. There was trivial regurgitation.    Peak gradient (D): 3 mm Hg.  ------------------------------------------------------------------- Left atrium:  The atrium was normal in size.  ------------------------------------------------------------------- Atrial septum:  No defect or patent foramen ovale was identified.   ------------------------------------------------------------------- Right ventricle:  The cavity size was normal. Wall thickness was normal. Systolic function was normal.  ------------------------------------------------------------------- Pulmonic valve:    Doppler:  Transvalvular velocity was within the normal range. There was no evidence for stenosis. There was mild regurgitation.  ------------------------------------------------------------------- Tricuspid valve:   Structurally normal valve.    Doppler: Transvalvular velocity was within the normal range. There was mild regurgitation.  ------------------------------------------------------------------- Pulmonary artery:   The main pulmonary artery was normal-sized. Systolic pressure was within the normal  range.  ------------------------------------------------------------------- Right atrium:  The atrium was mildly dilated.  ------------------------------------------------------------------- Pericardium:  The pericardium was normal in appearance. There was no pericardial effusion.  ------------------------------------------------------------------- Systemic veins: Inferior vena cava: The vessel was dilated. The respirophasic diameter changes were blunted (< 50%), consistent with elevated central venous pressure.  ------------------------------------------------------------------- Post procedure conclusions Ascending Aorta:  - The aorta was moderately dilated.  ------------------------------------------------------------------- Measurements   Left ventricle  Value          Reference  LV ID, ED, PLAX chordal                   50    mm       43 - 52  LV ID, ES, PLAX chordal                   37    mm       23 - 38  LV fx shortening, PLAX chordal    (L)     26    %        >=29  LV PW thickness, ED                       14    mm       ---------  IVS/LV PW ratio, ED                       0.93           <=1.3  Stroke volume, 2D                         93    ml       ---------  Stroke volume/bsa, 2D                     38    ml/m^2   ---------  LV e&', lateral                            5.44  cm/s     ---------  LV E/e&', lateral                          14.61          ---------  LV e&', medial                             5.87  cm/s     ---------  LV E/e&', medial                           13.54          ---------  LV e&', average                            5.66  cm/s     ---------  LV E/e&', average                          14.06          ---------    Ventricular septum                        Value          Reference  IVS thickness, ED                         13    mm       ---------    LVOT  Value           Reference  LVOT ID, S                                22    mm       ---------  LVOT area                                 3.8   cm^2     ---------  LVOT ID                                   22    mm       ---------  LVOT peak velocity, S                     105   cm/s     ---------  LVOT mean velocity, S                     66.1  cm/s     ---------  LVOT VTI, S                               24.4  cm       ---------  Stroke volume (SV), LVOT DP               92.8  ml       ---------  Stroke index (SV/bsa), LVOT DP            37.5  ml/m^2   ---------    Aortic valve                              Value          Reference  Aortic valve peak velocity, S             206   cm/s     ---------  Aortic valve mean velocity, S             136   cm/s     ---------  Aortic valve VTI, S                       51.2  cm       ---------  Aortic mean gradient, S                   9     mm Hg    ---------  Aortic peak gradient, S                   17    mm Hg    ---------  VTI ratio, LVOT/AV                        0.48           ---------  Aortic valve area, VTI                    1.81  cm^2     ---------  Aortic valve area/bsa, VTI  0.73  cm^2/m^2 ---------  Velocity ratio, peak, LVOT/AV             0.51           ---------  Aortic valve area, peak velocity          1.94  cm^2     ---------  Aortic valve area/bsa, peak               0.78  cm^2/m^2 ---------  velocity  Velocity ratio, mean, LVOT/AV             0.49           ---------  Aortic valve area, mean velocity          1.85  cm^2     ---------  Aortic valve area/bsa, mean               0.75  cm^2/m^2 ---------  velocity    Aorta                                     Value          Reference  Aortic root ID, ED                        42    mm       ---------  Ascending aorta ID, A-P, S                49    mm       ---------    Left atrium                               Value          Reference  LA ID, A-P, ES                             32    mm       ---------  LA ID/bsa, A-P                            1.29  cm/m^2   <=2.2  LA volume, S                              25.9  ml       ---------  LA volume/bsa, S                          10.5  ml/m^2   ---------  LA volume, ES, 1-p A4C                    25.9  ml       ---------  LA volume/bsa, ES, 1-p A4C                10.5  ml/m^2   ---------  LA volume, ES, 1-p A2C                    24.9  ml       ---------  LA volume/bsa, ES, 1-p A2C  10.1  ml/m^2   ---------    Mitral valve                              Value          Reference  Mitral E-wave peak velocity               79.5  cm/s     ---------  Mitral A-wave peak velocity               105   cm/s     ---------  Mitral deceleration time                  229   ms       150 - 230  Mitral peak gradient, D                   3     mm Hg    ---------  Mitral E/A ratio, peak                    0.8            ---------    Pulmonary arteries                        Value          Reference  PA pressure, S, DP                (H)     54    mm Hg    <=30    Tricuspid valve                           Value          Reference  Tricuspid regurg peak velocity            338   cm/s     ---------  Tricuspid peak RV-RA gradient             46    mm Hg    ---------    Right atrium                              Value          Reference  RA ID, S-I, ES                            48    mm       ---------  RA ID, M-L, ES, A4C                       44    mm       30 - 46    Systemic veins                            Value          Reference  Estimated CVP                             8     mm Hg    ---------    Right ventricle  Value          Reference  RV ID, minor axis, ED, A4C base           46    mm       ---------  RV ID, minor axis, ED, A4C mid            26    mm       ---------  RV ID, major axis, ED, A4C                57    mm       55 - 91  RV pressure, S, DP                (H)     54    mm Hg     <=30  Legend: (L)  and  (H)  mark values outside specified reference range.  ------------------------------------------------------------------- Prepared and Electronically Authenticated by  Jenkins Rouge, M.D. 2019-02-13T11:50:02  Assessment / Plan:   Stable 4.9 cm ascending aortic dilatation.  Cardiac CT suggests a bileaflet valve echocardiogram suggest a trileaflet valve  No coronary disease of significance noted on cardiac cath February 2019 Patient remains limited by his shortness of breath, t  With the patient's overall poor medical condition I would not recommend elective repair of his ascending aorta at this point.  We'll plan to see the patient back in 8  months with a followup CTA of the chest   Grace Isaac MD  [place] Office [phone] 08/15/2018 3:14 PM

## 2018-08-15 ENCOUNTER — Other Ambulatory Visit: Payer: Self-pay

## 2018-08-15 ENCOUNTER — Ambulatory Visit (INDEPENDENT_AMBULATORY_CARE_PROVIDER_SITE_OTHER): Payer: Medicare Other | Admitting: Cardiothoracic Surgery

## 2018-08-15 ENCOUNTER — Encounter: Payer: Self-pay | Admitting: Cardiothoracic Surgery

## 2018-08-15 ENCOUNTER — Ambulatory Visit
Admission: RE | Admit: 2018-08-15 | Discharge: 2018-08-15 | Disposition: A | Discharge status: Home / Self Care | Payer: Medicare Other | Source: Ambulatory Visit | Attending: Cardiothoracic Surgery | Admitting: Cardiothoracic Surgery

## 2018-08-15 VITALS — BP 122/66 | HR 69 | Temp 97.6°F | Resp 20 | Ht 67.25 in | Wt 243.0 lb

## 2018-08-15 DIAGNOSIS — I712 Thoracic aortic aneurysm, without rupture, unspecified: Secondary | ICD-10-CM

## 2018-08-15 IMAGING — CT CT ANGIOGRAPHY CHEST
2 of 6 series · 13 of 36 positions shown · IV contrast (iopamidol)
Comparison: CT scan of [DATE].

CLINICAL DATA: Thoracic aortic aneurysm without rupture.

EXAM:
CT ANGIOGRAPHY CHEST WITH CONTRAST
TECHNIQUE: Multidetector CT imaging of the chest was performed using the
standard protocol during bolus administration of intravenous
contrast. Multiplanar CT image reconstructions and MIPs were
obtained to evaluate the vascular anatomy.
CONTRAST:  75mL [5E] IOPAMIDOL ([5E]) INJECTION 76%

[Series 5: cta thorax 2.00 bv36 s3 axial arterial · axial · arterial · 0.65mm/px · z∈[+1432,+1688]mm · 12 of 152 slices shown]
[im 12/152  lung]
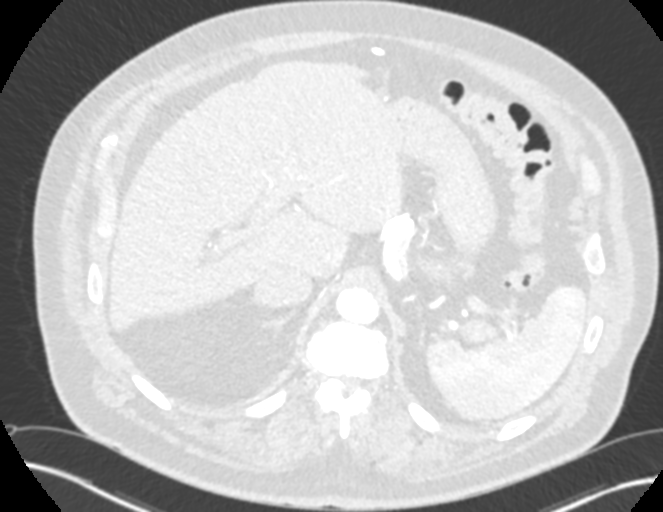
[im 24/152  mediastinal]
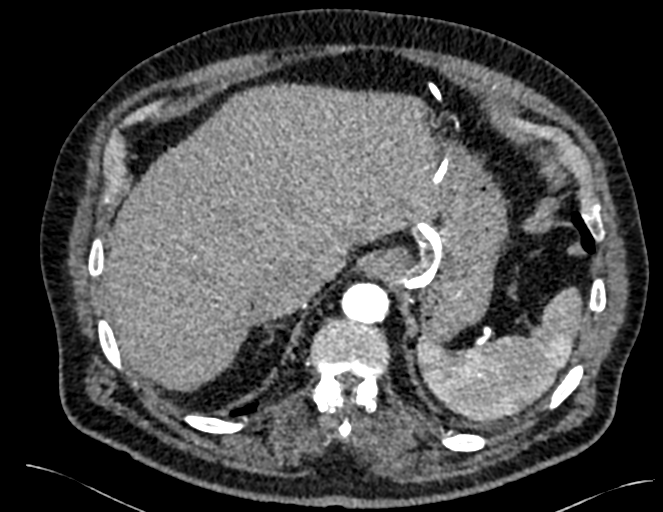
[im 35/152  lung]
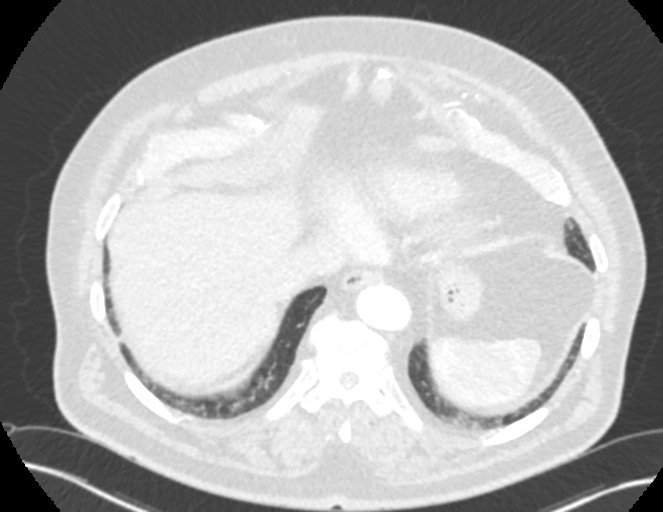
[im 47/152  mediastinal]
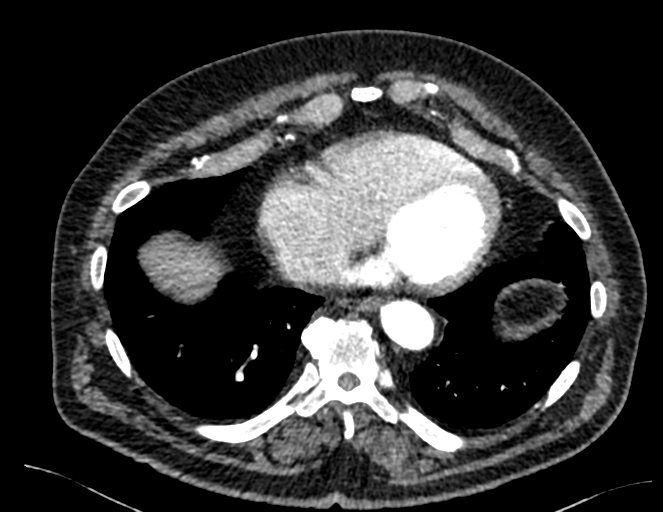
[im 59/152  lung]
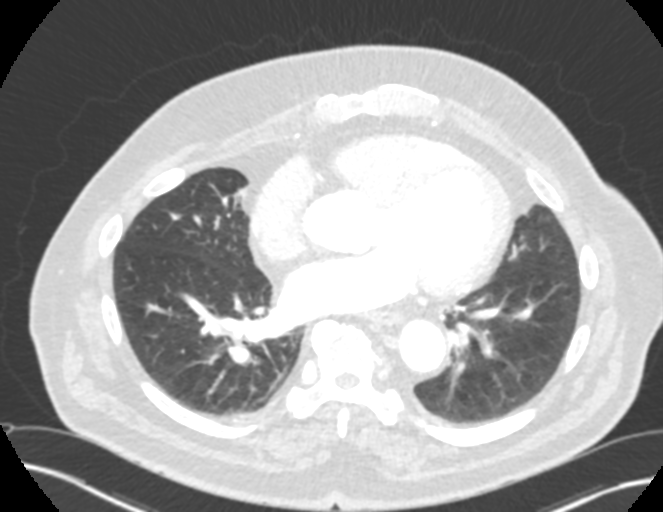
[im 70/152  mediastinal]
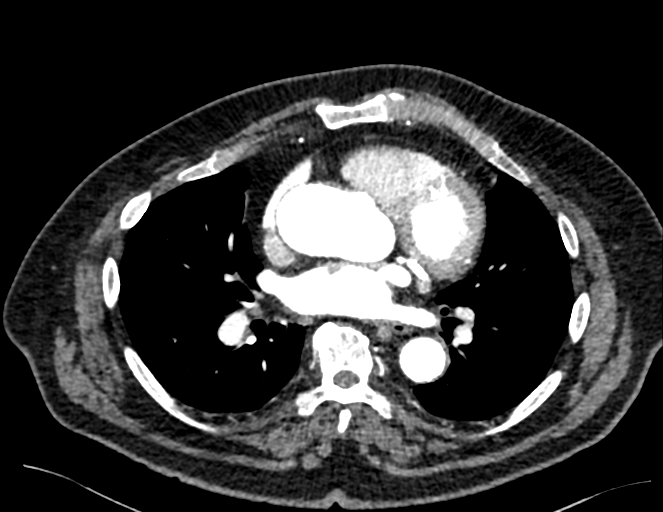
[im 82/152  lung]
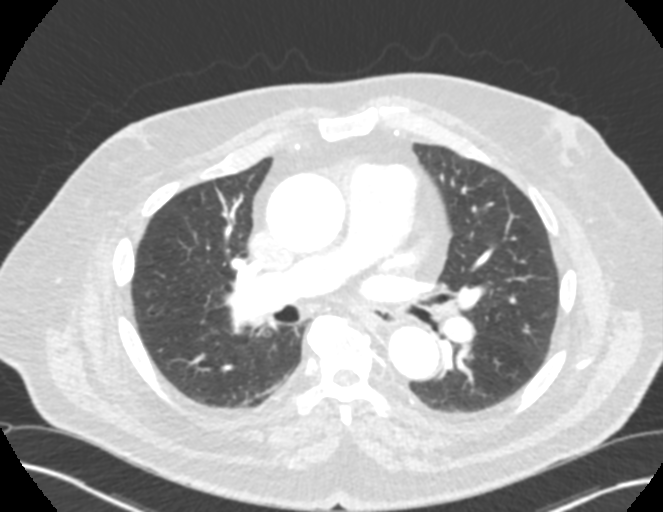
[im 93/152  mediastinal]
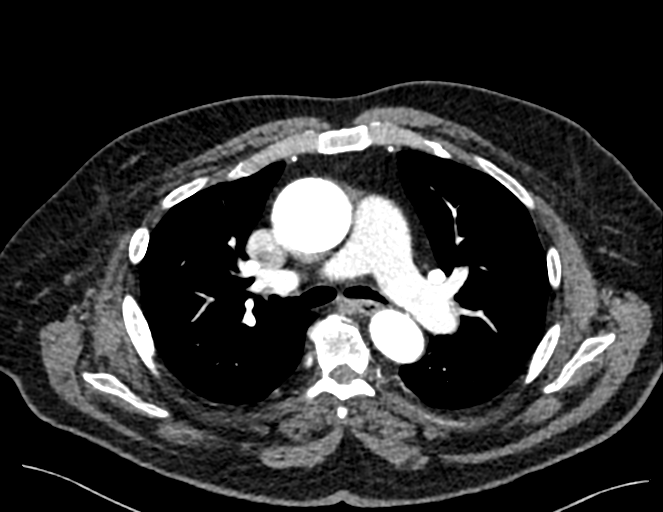
[im 105/152  lung]
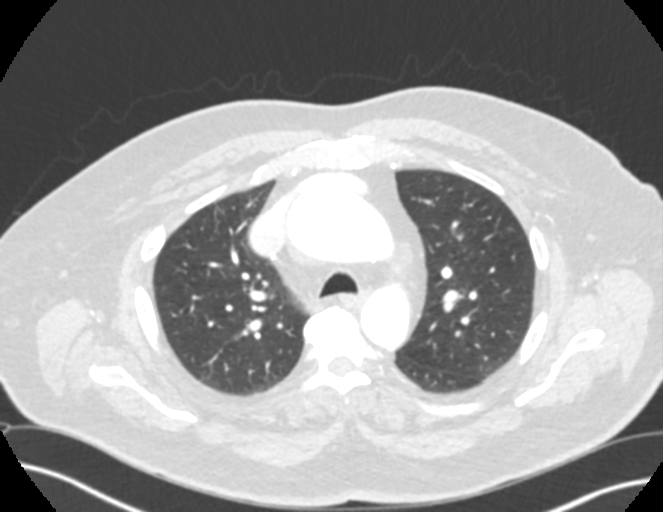
[im 117/152  mediastinal]
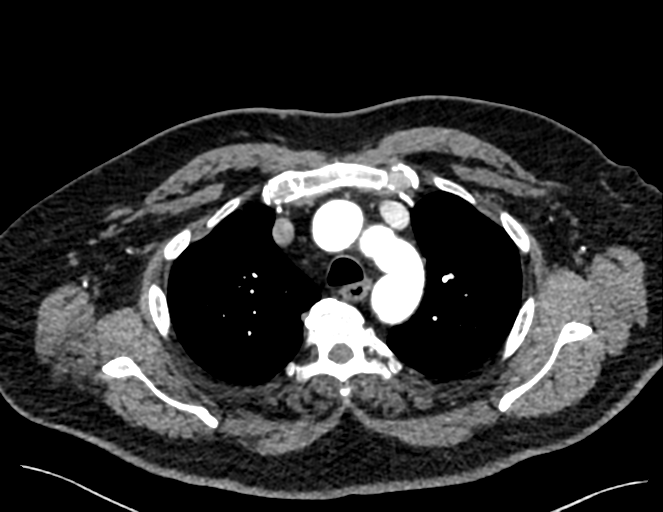
[im 128/152  lung]
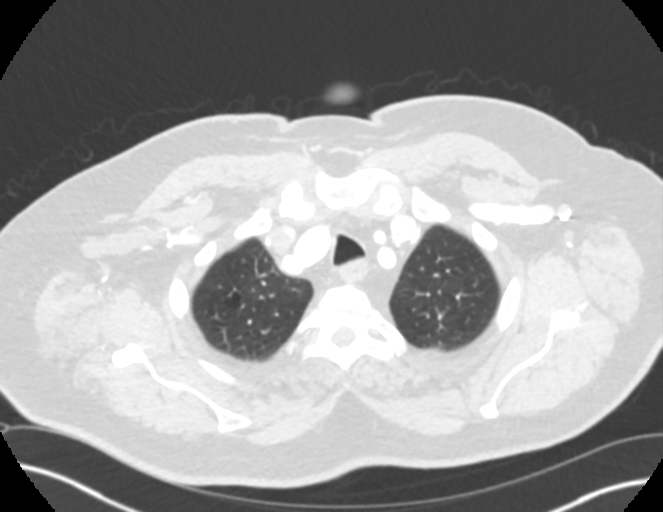
[im 140/152  mediastinal]
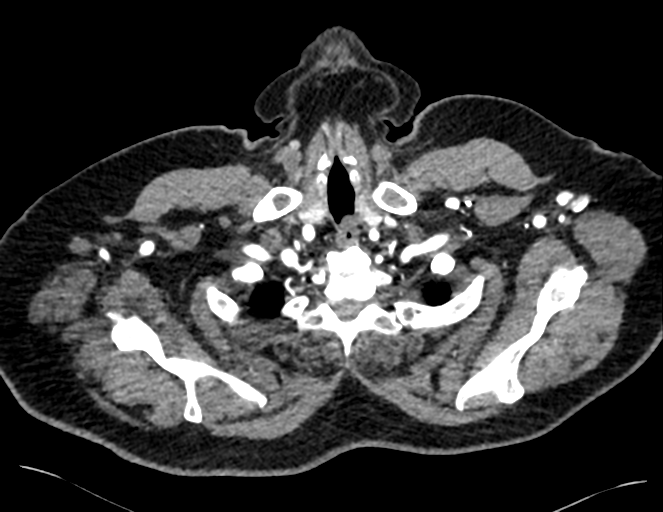

[Series 10: cta thorax 2.00 bv36 s3 cor st · coronal · 0.59mm/px · 1 of 167 slices shown]
[im 84/167  mediastinal]
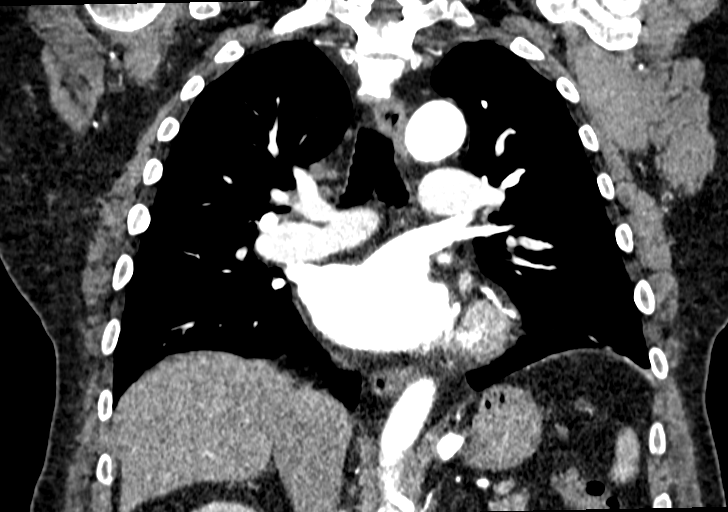

[13 of 36 positions shown; findings below may reference images not displayed]

FINDINGS: Cardiovascular: Grossly stable 4.8 cm ascending thoracic aortic
aneurysm is noted. No dissection is noted. Great vessels are widely
patent without significant stenosis. Mild cardiomegaly. No
pericardial effusion.

Mediastinum/Nodes: No enlarged mediastinal, hilar, or axillary lymph
nodes. Thyroid gland, trachea, and esophagus demonstrate no
significant findings.

Lungs/Pleura: Lungs are clear. No pleural effusion or pneumothorax.

Upper Abdomen: No acute abnormality.

Musculoskeletal: No chest wall abnormality. No acute or significant
osseous findings.

Review of the MIP images confirms the above findings.
IMPRESSION: Grossly stable 4.8 cm ascending thoracic aortic aneurysm. Ascending
thoracic aortic aneurysm. Recommend semi-annual imaging followup by
CTA or MRA and referral to cardiothoracic surgery if not already
obtained. This recommendation follows [5E]
ACCF/AHA/AATS/ACR/ASA/SCA/GENRI/GENRI/GENRI/GENRI Guidelines for the
Diagnosis and Management of Patients With Thoracic Aortic Disease.
Circulation. [5E]; 121: [id number]. Aortic aneurysm NOS
([5E]-[5E]).

## 2018-08-15 MED ORDER — IOPAMIDOL (ISOVUE-370) INJECTION 76%
75.0000 mL | Freq: Once | INTRAVENOUS | Status: AC | PRN
  Administered 2018-08-15: 75 mL via INTRAVENOUS

## 2018-08-19 DIAGNOSIS — E119 Type 2 diabetes mellitus without complications: Secondary | ICD-10-CM | POA: Diagnosis not present

## 2018-08-19 DIAGNOSIS — Z8546 Personal history of malignant neoplasm of prostate: Secondary | ICD-10-CM | POA: Diagnosis not present

## 2018-08-19 DIAGNOSIS — I2510 Atherosclerotic heart disease of native coronary artery without angina pectoris: Secondary | ICD-10-CM | POA: Diagnosis not present

## 2018-08-19 DIAGNOSIS — D649 Anemia, unspecified: Secondary | ICD-10-CM | POA: Diagnosis not present

## 2018-08-19 DIAGNOSIS — I10 Essential (primary) hypertension: Secondary | ICD-10-CM | POA: Diagnosis not present

## 2018-08-19 DIAGNOSIS — E785 Hyperlipidemia, unspecified: Secondary | ICD-10-CM | POA: Diagnosis not present

## 2018-08-19 DIAGNOSIS — Z87891 Personal history of nicotine dependence: Secondary | ICD-10-CM | POA: Diagnosis not present

## 2018-08-19 DIAGNOSIS — C61 Malignant neoplasm of prostate: Secondary | ICD-10-CM | POA: Diagnosis not present

## 2018-08-19 DIAGNOSIS — J45909 Unspecified asthma, uncomplicated: Secondary | ICD-10-CM | POA: Diagnosis not present

## 2018-08-19 DIAGNOSIS — I480 Paroxysmal atrial fibrillation: Secondary | ICD-10-CM | POA: Diagnosis not present

## 2018-08-19 DIAGNOSIS — I509 Heart failure, unspecified: Secondary | ICD-10-CM | POA: Diagnosis not present

## 2018-08-19 DIAGNOSIS — G8929 Other chronic pain: Secondary | ICD-10-CM | POA: Diagnosis not present

## 2018-08-19 DIAGNOSIS — M549 Dorsalgia, unspecified: Secondary | ICD-10-CM | POA: Diagnosis not present

## 2018-08-19 DIAGNOSIS — J4530 Mild persistent asthma, uncomplicated: Secondary | ICD-10-CM | POA: Diagnosis not present

## 2018-08-19 DIAGNOSIS — R001 Bradycardia, unspecified: Secondary | ICD-10-CM | POA: Diagnosis not present

## 2018-08-19 DIAGNOSIS — Z08 Encounter for follow-up examination after completed treatment for malignant neoplasm: Secondary | ICD-10-CM | POA: Diagnosis not present

## 2018-08-19 DIAGNOSIS — I4891 Unspecified atrial fibrillation: Secondary | ICD-10-CM | POA: Diagnosis not present

## 2018-08-19 DIAGNOSIS — Z923 Personal history of irradiation: Secondary | ICD-10-CM | POA: Diagnosis not present

## 2018-08-19 DIAGNOSIS — I5030 Unspecified diastolic (congestive) heart failure: Secondary | ICD-10-CM | POA: Diagnosis not present

## 2018-08-30 ENCOUNTER — Other Ambulatory Visit: Payer: Self-pay

## 2018-08-30 MED ORDER — LOSARTAN POTASSIUM 25 MG PO TABS: 25.0000 mg | ORAL_TABLET | Freq: Every day | ORAL | 1 refills | Status: DC

## 2018-09-03 ENCOUNTER — Other Ambulatory Visit: Payer: Self-pay

## 2018-09-03 MED ORDER — LOSARTAN POTASSIUM 25 MG PO TABS: 25.0000 mg | ORAL_TABLET | Freq: Every day | ORAL | 1 refills | Status: DC

## 2018-09-16 ENCOUNTER — Ambulatory Visit: Payer: Medicare Other | Admitting: Cardiology

## 2018-09-19 DIAGNOSIS — M5416 Radiculopathy, lumbar region: Secondary | ICD-10-CM | POA: Diagnosis not present

## 2018-10-07 ENCOUNTER — Other Ambulatory Visit: Payer: Self-pay | Admitting: Physician Assistant

## 2018-10-10 DIAGNOSIS — D509 Iron deficiency anemia, unspecified: Secondary | ICD-10-CM | POA: Diagnosis not present

## 2018-10-10 DIAGNOSIS — N400 Enlarged prostate without lower urinary tract symptoms: Secondary | ICD-10-CM | POA: Diagnosis not present

## 2018-10-10 DIAGNOSIS — I5030 Unspecified diastolic (congestive) heart failure: Secondary | ICD-10-CM | POA: Diagnosis not present

## 2018-10-10 DIAGNOSIS — E785 Hyperlipidemia, unspecified: Secondary | ICD-10-CM | POA: Diagnosis not present

## 2018-10-10 DIAGNOSIS — I2510 Atherosclerotic heart disease of native coronary artery without angina pectoris: Secondary | ICD-10-CM | POA: Diagnosis not present

## 2018-10-10 DIAGNOSIS — E1165 Type 2 diabetes mellitus with hyperglycemia: Secondary | ICD-10-CM | POA: Diagnosis not present

## 2018-10-10 DIAGNOSIS — I10 Essential (primary) hypertension: Secondary | ICD-10-CM | POA: Diagnosis not present

## 2018-10-10 DIAGNOSIS — D6869 Other thrombophilia: Secondary | ICD-10-CM | POA: Diagnosis not present

## 2018-10-10 DIAGNOSIS — Z1389 Encounter for screening for other disorder: Secondary | ICD-10-CM | POA: Diagnosis not present

## 2018-10-10 DIAGNOSIS — J4530 Mild persistent asthma, uncomplicated: Secondary | ICD-10-CM | POA: Diagnosis not present

## 2018-10-10 DIAGNOSIS — Z0001 Encounter for general adult medical examination with abnormal findings: Secondary | ICD-10-CM | POA: Diagnosis not present

## 2018-10-10 DIAGNOSIS — I4891 Unspecified atrial fibrillation: Secondary | ICD-10-CM | POA: Diagnosis not present

## 2018-10-10 DIAGNOSIS — N183 Chronic kidney disease, stage 3 (moderate): Secondary | ICD-10-CM | POA: Diagnosis not present

## 2018-10-11 ENCOUNTER — Telehealth: Payer: Self-pay | Admitting: Orthopaedic Surgery

## 2018-10-11 NOTE — Telephone Encounter (Signed)
Please advise 

## 2018-10-11 NOTE — Telephone Encounter (Signed)
Patient called asked when should he stop taking Xarelto? The number to contact patient is [phone]

## 2018-10-11 NOTE — Telephone Encounter (Signed)
He should stop Xarelto 4 days prior to surgery.

## 2018-10-11 NOTE — Telephone Encounter (Signed)
Patient aware of the message below

## 2018-10-14 NOTE — Patient Instructions (Addendum)
DUE TO COVID-19 ONLY ONE VISITOR IS ALLOWED TO COME WITH YOU AND STAY IN THE WAITING ROOM ONLY DURING PRE OP AND PROCEDURE DAY OF SURGERY. THE 1 VISITOR MAY VISIT WITH YOU AFTER SURGERY IN YOUR PRIVATE ROOM DURING VISITING HOURS ONLY!  YOU NEED TO HAVE A COVID 19 TEST ON 10-15-18  @ 2:45 PM. THIS TEST MUST BE DONE BEFORE SURGERY, COME  [place], [place] , [id number].  ([place]) ONCE YOUR COVID TEST IS COMPLETED, PLEASE BEGIN THE QUARANTINE INSTRUCTIONS AS OUTLINED IN YOUR HANDOUT.                Carlos Dawson  10/14/2018   Your procedure is scheduled on: 10-18-18    Report to [facility] Main  Entrance    Report to Admitting at 9:45 AM     Call this number if you have problems the morning of surgery [phone]    Remember: NO SOLID FOOD AFTER MIDNIGHT THE NIGHT PRIOR TO SURGERY. NOTHING BY MOUTH EXCEPT CLEAR LIQUIDS UNTIL . PLEASE FINISH ENSURE DRINK PER SURGEON ORDER  WHICH NEEDS TO BE COMPLETED AT 9:15 AM .   CLEAR LIQUID DIET   Foods Allowed                                                                     Foods Excluded  Coffee and tea, regular and decaf                             liquids that you cannot  Plain Jell-O any favor except red or purple                                           see through such as: Fruit ices (not with fruit pulp)                                     milk, soups, orange juice  Iced Popsicles                                    All solid food Carbonated beverages, regular and diet                                    Cranberry, grape and apple juices Sports drinks like Gatorade Lightly seasoned clear broth or consume(fat free) Sugar, honey syrup   _____________________________________________________________________       Take these medicines the morning of surgery with A SIP OF WATER: Metoprolol Tartrate (Lopressor), Topamax, and Omeprazole (Prilosec)    BRUSH YOUR TEETH MORNING OF SURGERY AND RINSE  YOUR MOUTH OUT, NO CHEWING GUM CANDY OR MINTS.  DO NOT TAKE ANY DIABETIC MEDICATIONS DAY OF YOUR SURGERY  You may not have any metal on your body including hair pins and              piercings     Do not wear jewelry, cologne, lotions, powders or deodorant                           Men may shave face and neck.   Do not bring valuables to the hospital. [place].  Contacts, dentures or bridgework may not be worn into surgery.  Leave suitcase in the car. After surgery it may be brought to your room.     Special Instructions: N/A              Please read over the following fact sheets you were given: _____________________________________________________________________   How to Manage Your Diabetes Before and After Surgery  Why is it important to control my blood sugar before and after surgery? . Improving blood sugar levels before and after surgery helps healing and can limit problems. . A way of improving blood sugar control is eating a healthy diet by: o  Eating less sugar and carbohydrates o  Increasing activity/exercise o  Talking with your doctor about reaching your blood sugar goals . High blood sugars (greater than 180 mg/dL) can raise your risk of infections and slow your recovery, so you will need to focus on controlling your diabetes during the weeks before surgery. . Make sure that the doctor who takes care of your diabetes knows about your planned surgery including the date and location.  How do I manage my blood sugar before surgery? . Check your blood sugar at least 4 times a day, starting 2 days before surgery, to make sure that the level is not too high or low. o Check your blood sugar the morning of your surgery when you wake up and every 2 hours until you get to the Short Stay unit. . If your blood sugar is less than 70 mg/dL, you will need to treat for low blood sugar: o Do not take  insulin. o Treat a low blood sugar (less than 70 mg/dL) with  cup of clear juice (cranberry or apple), 4 glucose tablets, OR glucose gel. o Recheck blood sugar in 15 minutes after treatment (to make sure it is greater than 70 mg/dL). If your blood sugar is not greater than 70 mg/dL on recheck, call [phone] for further instructions. . Report your blood sugar to the short stay nurse when you get to Short Stay.  . If you are admitted to the hospital after surgery: o Your blood sugar will be checked by the staff and you will probably be given insulin after surgery (instead of oral diabetes medicines) to make sure you have good blood sugar levels. o The goal for blood sugar control after surgery is 80-180 mg/dL.   WHAT DO I DO ABOUT MY DIABETES MEDICATION?  Marland Kitchen Do not take oral diabetes medicines (pills) the morning of surgery.  THE DAY BEFORE SURGERY, Do not take your [facility] - Preparing for Surgery Before surgery, you can play an important role.  Because skin is not sterile, your skin needs to be as free of germs as possible.  You can reduce the number of germs on your skin by washing with CHG (chlorahexidine gluconate) soap before surgery.  CHG is an antiseptic cleaner which kills germs and bonds with the skin to continue killing germs even after washing. Please DO NOT use if you have an allergy to CHG or antibacterial soaps.  If your skin becomes reddened/irritated stop using the CHG and inform your nurse when you arrive at Short Stay. Do not shave (including legs and underarms) for at least 48 hours prior to the first CHG shower.  You may shave your face/neck. Please follow these instructions carefully:  1.  Shower with CHG Soap the night before surgery and the  morning of Surgery.  2.  If you choose to wash your hair, wash your hair first as usual with your  normal  shampoo.  3.  After you shampoo, rinse your hair and body thoroughly to remove the  shampoo.                            4.  Use CHG as you would any other liquid soap.  You can apply chg directly  to the skin and wash                       Gently with a scrungie or clean washcloth.  5.  Apply the CHG Soap to your body ONLY FROM THE NECK DOWN.   Do not use on face/ open                           Wound or open sores. Avoid contact with eyes, ears mouth and genitals (private parts).                       Wash face,  Genitals (private parts) with your normal soap.             6.  Wash thoroughly, paying special attention to the area where your surgery  will be performed.  7.  Thoroughly rinse your body with warm water from the neck down.  8.  DO NOT shower/wash with your normal soap after using and rinsing off  the CHG Soap.                9.  Pat yourself dry with a clean towel.            10.  Wear clean pajamas.            11.  Place clean sheets on your bed the night of your first shower and do not  sleep with pets. Day of Surgery : Do not apply any lotions/deodorants the morning of surgery.  Please wear clean clothes to the hospital/surgery center.  FAILURE TO FOLLOW THESE INSTRUCTIONS MAY RESULT IN THE CANCELLATION OF YOUR SURGERY PATIENT SIGNATURE_________________________________  NURSE SIGNATURE__________________________________  ________________________________________________________________________

## 2018-10-15 ENCOUNTER — Other Ambulatory Visit: Payer: Self-pay

## 2018-10-15 ENCOUNTER — Encounter (HOSPITAL_COMMUNITY)
Admission: RE | Admit: 2018-10-15 | Discharge: 2018-10-15 | Disposition: A | Discharge status: Home / Self Care | Payer: Medicare Other | Source: Ambulatory Visit | Attending: Orthopaedic Surgery | Admitting: Orthopaedic Surgery

## 2018-10-15 ENCOUNTER — Encounter (HOSPITAL_COMMUNITY): Payer: Self-pay

## 2018-10-15 ENCOUNTER — Other Ambulatory Visit (HOSPITAL_COMMUNITY)
Admission: RE | Admit: 2018-10-15 | Discharge: 2018-10-15 | Disposition: A | Discharge status: Home / Self Care | Payer: Medicare Other | Source: Ambulatory Visit | Attending: Orthopaedic Surgery | Admitting: Orthopaedic Surgery

## 2018-10-15 DIAGNOSIS — Z20828 Contact with and (suspected) exposure to other viral communicable diseases: Secondary | ICD-10-CM | POA: Diagnosis not present

## 2018-10-15 DIAGNOSIS — Z01812 Encounter for preprocedural laboratory examination: Secondary | ICD-10-CM | POA: Insufficient documentation

## 2018-10-15 DIAGNOSIS — M1611 Unilateral primary osteoarthritis, right hip: Secondary | ICD-10-CM | POA: Insufficient documentation

## 2018-10-15 LAB — CBC
HCT: 35.8 % — ABNORMAL LOW (ref 39.0–52.0)
Hemoglobin: 11.8 g/dL — ABNORMAL LOW (ref 13.0–17.0)
MCH: 34.7 pg — ABNORMAL HIGH (ref 26.0–34.0)
MCHC: 33 g/dL (ref 30.0–36.0)
MCV: 105.3 fL — ABNORMAL HIGH (ref 80.0–100.0)
Platelets: 161 10*3/uL (ref 150–400)
RBC: 3.4 MIL/uL — ABNORMAL LOW (ref 4.22–5.81)
RDW: 14.1 % (ref 11.5–15.5)
WBC: 7.3 10*3/uL (ref 4.0–10.5)
nRBC: 0.3 % — ABNORMAL HIGH (ref 0.0–0.2)

## 2018-10-15 LAB — BASIC METABOLIC PANEL
Anion gap: 8 (ref 5–15)
BUN: 25 mg/dL — ABNORMAL HIGH (ref 8–23)
CO2: 21 mmol/L — ABNORMAL LOW (ref 22–32)
Calcium: 8.7 mg/dL — ABNORMAL LOW (ref 8.9–10.3)
Chloride: 111 mmol/L (ref 98–111)
Creatinine, Ser: 0.96 mg/dL (ref 0.61–1.24)
GFR calc Af Amer: >60 mL/min (ref >60)
GFR calc non Af Amer: >60 mL/min (ref >60)
Glucose, Bld: 111 mg/dL — ABNORMAL HIGH (ref 70–99)
Potassium: 3.9 mmol/L (ref 3.5–5.1)
Sodium: 140 mmol/L (ref 135–145)

## 2018-10-15 LAB — GLUCOSE, CAPILLARY: Glucose-Capillary: 113 mg/dL — ABNORMAL HIGH (ref 70–99)

## 2018-10-15 NOTE — Progress Notes (Signed)
Spoke to [facility] in Dr. Trevor Mace office to advise that cardiac clearance is required for patient, per Konrad Felix, PA. Sherri indicated that she would request cardiac clearance from Dr. Martinique.

## 2018-10-16 ENCOUNTER — Telehealth: Payer: Self-pay

## 2018-10-16 LAB — HEMOGLOBIN A1C
Hgb A1c MFr Bld: 6.7 % — ABNORMAL HIGH (ref 4.8–5.6)
Mean Plasma Glucose: 146 mg/dL

## 2018-10-16 LAB — NOVEL CORONAVIRUS, NAA (HOSP ORDER, SEND-OUT TO REF LAB; TAT 18-24 HRS): SARS-CoV-2, NAA: NOT DETECTED

## 2018-10-16 NOTE — Telephone Encounter (Signed)
   [facility] Pre-operative Risk Assessment    Request for surgical clearance:  1. What type of surgery is being performed?  Right total hip  2. When is this surgery scheduled? TBD  3. What type of clearance is required (medical clearance vs. Pharmacy clearance to hold med vs. Both)? Both  4. Are there any medications that need to be held prior to surgery and how long? Xarelto  5. Practice name and name of physician performing surgery?  [facility]    Dr.Christopher [place]  6. What is your office phone number [phone]   7.   What is your office fax number [phone]  8.   Anesthesia type  Spinal   Kathyrn Lass 10/16/2018, 5:14 PM  _________________________________________________________________   (provider comments below)

## 2018-10-17 ENCOUNTER — Telehealth: Payer: Self-pay | Admitting: *Deleted

## 2018-10-17 NOTE — Telephone Encounter (Signed)
Ortho bundle pre-op call and survey completed. 

## 2018-10-17 NOTE — Telephone Encounter (Signed)
   Primary Cardiologist: Peter Martinique, MD  Chart reviewed as part of pre-operative protocol coverage. Patient was contacted 10/17/2018 in reference to pre-operative risk assessment for pending surgery as outlined below.  Carlos Dawson was last seen on 06/26/18 by Roderic Palau NP.  Since that day, Carlos Dawson has done well.  He can complete more than 4.0 METS despite his hip pain.   He was instructed by the requesting office to hold his xarelto for 4 days. Given this, I will not request input from our clinical pharmacist. His surgery is tomorrow.    Therefore, based on ACC/AHA guidelines, the patient would be at acceptable risk for the planned procedure without further cardiovascular testing.   I will route this recommendation to the requesting party via Epic fax function and remove from pre-op pool.  Please call with questions.  Tami Lin Duke, PA 10/17/2018, 10:26 AM

## 2018-10-17 NOTE — Care Plan (Signed)
RNCM spoke with patient prior to his scheduled Right total hip arthroplasty to review the Ortho bundle program and review CM needs. He verbalized he is currently living in town in a hotel due to selling their house and staying at [facility] house up until surgery. He will be staying after surgery at [facility] by the [place] until his 2 week post op with Dr. Ninfa Linden. Reviewed address of hotel and room #. [address] [place], [place] [address]- Room [address]. He can be reached by cell number only- [phone]. He will need a FWW and 3 in 1/BSC. These will be ordered through [facility]. Also anticipate HHPT will be needed at discharge. Choice provided and [facility] referral made. Liaison made aware of post-discharge address and needs. Will continue to follow for any CM needs.

## 2018-10-17 NOTE — Progress Notes (Signed)
Anesthesia Chart Review   Case: [id number] Date/Time: 10/18/18 1200   Procedure: RIGHT TOTAL HIP ARTHROPLASTY ANTERIOR APPROACH (Right Hip)   Anesthesia type: Spinal   Pre-op diagnosis: osteoarthritis right hip   [facility] ROOM 09 / [facility]   Surgeon: Mcarthur Rossetti, MD      DISCUSSION:78 y.o. former smoker (45 pack years, quit 01/24/92) with h/o HTN, PE 2008, PAF (on Xarelto), CAD (BMS to 2nd OM 09/2010), CHF, OSA w/BPAP, DM II, prostate CA, thoracic aortic aneurysm (4.8 cm on CT 08/15/2018, stable, followed by Dr. Servando Snare), right hip OA scheduled for above procedure 10/18/2018 with Dr. Jean Rosenthal.   Cleared by cardiology 10/17/2018.  Per Fabian Sharp, PA-C, "Chart reviewed as part of pre-operative protocol coverage. Patient was contacted 10/17/2018 in reference to pre-operative risk assessment for pending surgery as outlined below.  Carlos Dawson was last seen on 06/26/18 by Roderic Palau NP.  Since that day, BRACH PITZEN has done well.  He can complete more than 4.0 METS despite his hip pain. He was instructed by the requesting office to hold his xarelto for 4 days. Given this, I will not request input from our clinical pharmacist. His surgery is tomorrow. Therefore, based on ACC/AHA guidelines, the patient would be at acceptable risk for the planned procedure without further cardiovascular testing."  Advised by Dr. Ninfa Linden to hold Xarelto 5 days prior to procedure.  Pt reports last dose 10/12/2018.   Anticipate pt can proceed with planned procedure barring acute status change.   VS: BP 109/62   Pulse 78   Temp 37 C (Oral)   Resp 18   Ht 5\' 5"  (1.651 m)   Wt 106.6 kg   SpO2 99%   BMI 39.11 kg/m   PROVIDERS: Josetta Huddle, MD is PCP    LABS: Labs reviewed: Acceptable for surgery. (all labs ordered are listed, but only abnormal results are displayed)  Labs Reviewed  HEMOGLOBIN A1C - Abnormal; Notable for the following components:      Result Value   Hgb A1c  MFr Bld 6.7 (*)    All other components within normal limits  BASIC METABOLIC PANEL - Abnormal; Notable for the following components:   CO2 21 (*)    Glucose, Bld 111 (*)    BUN 25 (*)    Calcium 8.7 (*)    All other components within normal limits  CBC - Abnormal; Notable for the following components:   RBC 3.40 (*)    Hemoglobin 11.8 (*)    HCT 35.8 (*)    MCV 105.3 (*)    MCH 34.7 (*)    nRBC 0.3 (*)    All other components within normal limits  GLUCOSE, CAPILLARY - Abnormal; Notable for the following components:   Glucose-Capillary 113 (*)    All other components within normal limits     IMAGES: CT Angio Chest 08/15/2018 IMPRESSION: Grossly stable 4.8 cm ascending thoracic aortic aneurysm. Ascending thoracic aortic aneurysm. Recommend semi-annual imaging followup by CTA or MRA and referral to cardiothoracic surgery if not already obtained. This recommendation follows 2010 ACCF/AHA/AATS/ACR/ASA/SCA/SCAI/SIR/STS/SVM Guidelines for the Diagnosis and Management of Patients With Thoracic Aortic Disease. Circulation. 2010; [id number]. Aortic aneurysm NOS (ICD10-I71.9).   EKG: 02/25/2018 Rate 60 bpm Sinus rhythm with 1st degree AV block with premature atrial complexes Nonspecific T wave abnormality   CV: Echo 10/17/2017 Study Conclusions  - Left ventricle: The cavity size was severely dilated. There was   moderate focal basal hypertrophy. Systolic function  was normal.   The estimated ejection fraction was in the range of 60% to 65%.   Wall motion was normal; there were no regional wall motion   abnormalities. Features are consistent with a pseudonormal left   ventricular filling pattern, with concomitant abnormal relaxation   and increased filling pressure (grade 2 diastolic dysfunction).   Doppler parameters are consistent with high ventricular filling   pressure. - Aortic valve: Valve area (VTI): 2.79 cm^2. Valve area (Vmax): 2.8   cm^2. Valve area (Vmean):  2.57 cm^2. - Mitral valve: Valve area by pressure half-time: 2.1 cm^2. Valve   area by continuity equation (using LVOT flow): 3.12 cm^2. - Left atrium: The atrium was mildly dilated. - Tricuspid valve: There was mild regurgitation. - Pulmonic valve: Peak gradient (S): 12 mm Hg.  Impressions:  - Normal LVF with mild BSH, grade 2 DD, mild LAE, mild TR.  Cardiac Cath 03/13/2017  Previously placed Ost 2nd Mrg to 2nd Mrg stent (unknown type) is widely patent.  There is no aortic valve regurgitation.   1. No significant obstructive CAD 2. Aneurysmal thoracic aorta.   Plan: continue medical management . Consider pulmonary evaluation for symptoms of dyspnea.   Past Medical History:  Diagnosis Date  . Anticoagulant long-term use   . Aortic root enlargement ([place])   . Ascending aortic aneurysm [facility])    recent scan in October 2012 showing no change; followed by Dr. Servando Snare  . ASCVD (arteriosclerotic cardiovascular disease)    Prior BMS to the 2nd OM in September 2012; with repeat cath in October showing patency  . CAD (coronary artery disease)    a. s/p BMS to 2nd OM in Sept 2012; b. LexiScan Myoview (12/2012):  Inf infarct; bowel and motion artifact make study difficult to interpret; no ischemia; not gated; Low Risk  . CHF (congestive heart failure) ([place])    no recent issues 10/13/14  . Chronic back pain    "all over my back" (05/11/2017)  . Colonic polyp   . Contact lens/glasses fitting   . Diastolic dysfunction   . DVT (deep venous thrombosis) ([place])    ?LLE  . Frequent headaches    "probably weekly" (05/11/2017)  . Generalized headaches    neck stenosis  . GERD (gastroesophageal reflux disease)   . Hearing loss   . Hearing loss    more so on left  . Hemorrhoids   . History of stomach ulcers   . Hypertension   . IBS (irritable bowel syndrome)   . LVH (left ventricular hypertrophy)   . Mild intermittent asthma   . OA (osteoarthritis)    "all over" (05/11/2017)  .  Obesities, morbid ([place])   . OSA (obstructive sleep apnea)    PSG 03/30/97 AHI 21, BPAP 13/9  . OSA on CPAP   . PAF (paroxysmal atrial fibrillation) ([place])    a. on Xarelto b. s/p DCCV in 08/2016; b. Tikosyn failed 04/16/17 with plans for Multaq and possible Afib ablation with Dr. Rayann Heman  . Pneumonia    'several times" (05/11/2017)  . Prostate CA [facility])    Oncologist  DR. Daralene Milch [facility] 09/24/14, undetermined tx   prostate; S/P "radiation and hormone injections"  . Pulmonary embolism ([place]) 2008   "both lungs"  . SOB (shortness of breath)    on excertion  . Thoracic aortic aneurysm ([facility])    Aortic Size Index=     5.0    /Body surface area is 2.43 meters squared. = 2.05  < 2.75  cm/m2      4% risk per year 2.75 to 4.25          8% risk per year > 4.25 cm/m2    20% risk per year   Stable aneurysmal dilation of the ascending aorta with maximum AP diameter of 4.8 cm. Stable area of narrowing of the proximal most portion of the descending aorta measuring 2 cm., previously identified as an area of coarctation. No evidence of aortic dissection.  Coronary artery disease.  Normal appearance of the lungs.   Electronically Signed   By: Fidela Salisbury M.D.   On: 10/01/2014 08:50    . Type II diabetes mellitus ([place])    metphormin, average 154 dx 2017    Past Surgical History:  Procedure Laterality Date  . ACHILLES TENDON REPAIR Bilateral   . AORTIC ARCH ANGIOGRAPHY N/A 03/13/2017   Procedure: AORTIC ARCH ANGIOGRAPHY;  Surgeon: Martinique, [name], MD;  Location: [place] [facility] LAB;  Service: Cardiovascular;  Laterality: N/A;  . APPENDECTOMY    . ATRIAL FIBRILLATION ABLATION  05/11/2017  . ATRIAL FIBRILLATION ABLATION N/A 05/11/2017   Procedure: ATRIAL FIBRILLATION ABLATION;  Surgeon: [name], MD;  Location: [place] [facility] LAB;  Service: Cardiovascular;  Laterality: N/A;  . BACK SURGERY     "I've had 7 back and 1 neck ORs" (05/11/2017)  . BIOPSY  03/14/2018   Procedure: BIOPSY;  Surgeon: Ronnette Juniper, MD;  [facility];  Service: Gastroenterology;;  EGD and Colon  . CARDIAC CATHETERIZATION  2006  . CARPAL TUNNEL RELEASE Bilateral    LEFT  . CATARACT EXTRACTION W/ INTRAOCULAR LENS  IMPLANT, BILATERAL Bilateral   . CERVICAL SPINE SURGERY  06/02/2010   lower back and neck  . COLONOSCOPY N/A 03/14/2018   Procedure: COLONOSCOPY;  Surgeon: Ronnette Juniper, MD;  [facility];  Service: Gastroenterology;  Laterality: N/A;  . COLONOSCOPY WITH PROPOFOL N/A 12/29/2014   Procedure: COLONOSCOPY WITH PROPOFOL;  Surgeon: Garlan Fair, MD;  [facility];  Service: Endoscopy;  Laterality: N/A;  . CORONARY ANGIOPLASTY WITH STENT PLACEMENT  October 2012  . CORONARY STENT PLACEMENT  Sept 2012   2nd OM with BMS  . ESOPHAGOGASTRODUODENOSCOPY N/A 03/14/2018   Procedure: ESOPHAGOGASTRODUODENOSCOPY (EGD);  Surgeon: Ronnette Juniper, MD;  Location: [facility];  Service: Gastroenterology;  Laterality: N/A;  . HEMORROIDECTOMY    . LAMINECTOMY  05/30/2012   L 4 L5  . LAMINECTOMY WITH POSTERIOR LATERAL ARTHRODESIS LEVEL 3 N/A 10/18/2016   Procedure: Posterior Lateral Fusion - Lumbar One-Four, segmental instrumentation Lumbar One-Five,  decompression,;  Surgeon: Eustace Moore, MD;  Location: [facility] OR;  Service: Neurosurgery;  Laterality: N/A;  . LAPAROSCOPIC CHOLECYSTECTOMY    . LAPAROSCOPIC GASTRIC BANDING    . LEFT AND RIGHT HEART CATHETERIZATION WITH CORONARY ANGIOGRAM N/A 05/07/2014   Procedure: LEFT AND RIGHT HEART CATHETERIZATION WITH CORONARY ANGIOGRAM;  Surgeon: [name] Martinique, MD;  Location: [facility] CATH LAB;  Service: Cardiovascular;  Laterality: N/A;  . LEFT HEART CATH AND CORONARY ANGIOGRAPHY N/A 03/13/2017   Procedure: LEFT HEART CATH AND CORONARY ANGIOGRAPHY;  Surgeon: Martinique, [name], MD;  Location: [place] CV LAB;  Service: Cardiovascular;  Laterality: N/A;  . LUMBAR LAMINECTOMY/DECOMPRESSION MICRODISCECTOMY N/A 05/04/2016   Procedure: Laminectomy and Foraminotomy - Thoracic  twelve-Lumbar one -Posterior Fusion Lumbar one-two;  Surgeon: Eustace Moore, MD;  Location: [place];  Service: Neurosurgery;  Laterality: N/A;  . POLYPECTOMY  03/14/2018   Procedure: POLYPECTOMY;  Surgeon: Ronnette Juniper, MD;  [facility];  Service: Gastroenterology;;  . POSTERIOR LUMBAR FUSION  10/18/2016  . SHOULDER OPEN ROTATOR CUFF REPAIR Bilateral   . TONSILLECTOMY AND ADENOIDECTOMY    . TRIGGER FINGER RELEASE     LEFT  . UVULOPALATOPHARYNGOPLASTY    . VASECTOMY      MEDICATIONS: . acetaminophen (TYLENOL) 500 MG tablet  . albuterol (PROVENTIL HFA;VENTOLIN HFA) 108 (90 Base) MCG/ACT inhaler  . atorvastatin (LIPITOR) 10 MG tablet  . augmented betamethasone dipropionate (DIPROLENE-AF) 0.05 % cream  . budesonide-formoterol (SYMBICORT) 80-4.5 MCG/ACT inhaler  . celecoxib (CELEBREX) 200 MG capsule  . Cholecalciferol (VITAMIN D-3) 5000 units TABS  . Coenzyme Q10 100 MG capsule  . furosemide (LASIX) 80 MG tablet  . JARDIANCE 25 MG TABS tablet  . losartan (COZAAR) 25 MG tablet  . methocarbamol (ROBAXIN) 500 MG tablet  . metoprolol tartrate (LOPRESSOR) 25 MG tablet  . montelukast (SINGULAIR) 10 MG tablet  . nitroGLYCERIN (NITROSTAT) 0.4 MG SL tablet  . omeprazole (PRILOSEC) 20 MG capsule  . Polyethyl Glycol-Propyl Glycol (SYSTANE OP)  . potassium chloride SA (KLOR-CON M20) 20 MEQ tablet  . psyllium (METAMUCIL) 58.6 % packet  . rivaroxaban (XARELTO) 20 MG TABS tablet  . sildenafil (VIAGRA) 100 MG tablet  . Skin Protectants, Misc. (EUCERIN) cream  . spironolactone (ALDACTONE) 25 MG tablet  . tamsulosin (FLOMAX) 0.4 MG CAPS  . topiramate (TOPAMAX) 25 MG capsule  . traMADol (ULTRAM) 50 MG tablet   No current facility-administered medications for this encounter.      Maia Plan [facility] Pre-Surgical Testing [phone] 10/17/18  12:29 PM

## 2018-10-17 NOTE — Anesthesia Preprocedure Evaluation (Addendum)
Anesthesia Evaluation  Patient identified by MRN, date of birth, ID band Patient awake    Reviewed: Allergy & Precautions, NPO status , Patient's Chart, lab work & pertinent test results  History of Anesthesia Complications Negative for: history of anesthetic complications  Airway Mallampati: IV  TM Distance: <3 FB Neck ROM: Full    Dental   Pulmonary asthma , sleep apnea and Continuous Positive Airway Pressure Ventilation , former smoker, PE (2008)   Pulmonary exam normal        Cardiovascular hypertension, + CAD and + Cardiac Stents (BMS to 2nd OM 2012)  Normal cardiovascular exam+ dysrhythmias Atrial Fibrillation   TAA 4.8 cm   Neuro/Psych negative neurological ROS  negative psych ROS   GI/Hepatic Neg liver ROS, PUD, GERD  ,  Endo/Other  diabetes, Type 2  Renal/GU negative Renal ROS  negative genitourinary   Musculoskeletal  (+) Arthritis , Osteoarthritis,    Abdominal   Peds  Hematology negative hematology ROS (+)   Anesthesia Other Findings Echo 10/17/17: Normal LVF (60-65%) with mild BSH, grade 2 DD, mild LAE, mild TR.  Cleared by cardiology 10/17/2018.  Per Fabian Sharp, PA-C, "Chart reviewed as part of pre-operative protocol coverage. Patient was contacted 10/17/2018 in reference to pre-operative risk assessment for pending surgery as outlined below.  ART MCGLOIN was last seen on 06/26/18 by Roderic Palau NP.  Since that day, LEDELL MOONEYHAN has done well.  He can complete more than 4.0 METS despite his hip pain. He was instructed by the requesting office to hold his xarelto for 4 days. Given this, I will not request input from our clinical pharmacist. His surgery is tomorrow. Therefore, based on ACC/AHA guidelines, the patient would be at acceptable risk for the planned procedure without further cardiovascular testing."  Multiple prior back surgeries  Reproductive/Obstetrics                            Anesthesia Physical Anesthesia Plan  ASA: III  Anesthesia Plan: Spinal   Post-op Pain Management:    Induction:   PONV Risk Score and Plan: 1 and Propofol infusion, Treatment may vary due to age or medical condition, Ondansetron and TIVA  Airway Management Planned: Nasal Cannula and Simple Face Mask  Additional Equipment: None  Intra-op Plan:   Post-operative Plan:   Informed Consent: I have reviewed the patients History and Physical, chart, labs and discussed the procedure including the risks, benefits and alternatives for the proposed anesthesia with the patient or authorized representative who has indicated his/her understanding and acceptance.       Plan Discussed with:   Anesthesia Plan Comments: (See PAT note 10/15/2018, Konrad Felix, PA-C)      Anesthesia Quick Evaluation

## 2018-10-17 NOTE — Telephone Encounter (Signed)
  Surgery is scheduled for 10/18/18, office is calling to follow up

## 2018-10-18 ENCOUNTER — Observation Stay (HOSPITAL_COMMUNITY): Payer: Medicare Other

## 2018-10-18 ENCOUNTER — Encounter (HOSPITAL_COMMUNITY): Payer: Self-pay | Admitting: *Deleted

## 2018-10-18 ENCOUNTER — Ambulatory Visit (HOSPITAL_COMMUNITY): Payer: Medicare Other | Admitting: Physician Assistant

## 2018-10-18 ENCOUNTER — Encounter (HOSPITAL_COMMUNITY)
Admission: RE | Disposition: A | Discharge status: Home / Self Care | Payer: Self-pay | Source: Home / Self Care | Attending: Orthopaedic Surgery

## 2018-10-18 ENCOUNTER — Inpatient Hospital Stay (HOSPITAL_COMMUNITY)
Admission: RE | Admit: 2018-10-18 | Discharge: 2018-10-23 | DRG: 470 | Disposition: A | Discharge status: Home / Self Care | Payer: Medicare Other | Attending: Internal Medicine | Admitting: Internal Medicine

## 2018-10-18 ENCOUNTER — Ambulatory Visit (HOSPITAL_COMMUNITY): Payer: Medicare Other | Admitting: Certified Registered Nurse Anesthetist

## 2018-10-18 ENCOUNTER — Other Ambulatory Visit: Payer: Self-pay

## 2018-10-18 ENCOUNTER — Ambulatory Visit (HOSPITAL_COMMUNITY): Payer: Medicare Other

## 2018-10-18 ENCOUNTER — Other Ambulatory Visit: Payer: Self-pay | Admitting: *Deleted

## 2018-10-18 DIAGNOSIS — Z961 Presence of intraocular lens: Secondary | ICD-10-CM | POA: Diagnosis present

## 2018-10-18 DIAGNOSIS — Z888 Allergy status to other drugs, medicaments and biological substances status: Secondary | ICD-10-CM

## 2018-10-18 DIAGNOSIS — Z9842 Cataract extraction status, left eye: Secondary | ICD-10-CM

## 2018-10-18 DIAGNOSIS — G8929 Other chronic pain: Secondary | ICD-10-CM | POA: Diagnosis present

## 2018-10-18 DIAGNOSIS — E785 Hyperlipidemia, unspecified: Secondary | ICD-10-CM | POA: Diagnosis not present

## 2018-10-18 DIAGNOSIS — K219 Gastro-esophageal reflux disease without esophagitis: Secondary | ICD-10-CM | POA: Diagnosis not present

## 2018-10-18 DIAGNOSIS — R0789 Other chest pain: Secondary | ICD-10-CM | POA: Diagnosis not present

## 2018-10-18 DIAGNOSIS — D62 Acute posthemorrhagic anemia: Secondary | ICD-10-CM | POA: Diagnosis not present

## 2018-10-18 DIAGNOSIS — Z86718 Personal history of other venous thrombosis and embolism: Secondary | ICD-10-CM

## 2018-10-18 DIAGNOSIS — Z955 Presence of coronary angioplasty implant and graft: Secondary | ICD-10-CM

## 2018-10-18 DIAGNOSIS — M549 Dorsalgia, unspecified: Secondary | ICD-10-CM | POA: Diagnosis present

## 2018-10-18 DIAGNOSIS — Z96641 Presence of right artificial hip joint: Secondary | ICD-10-CM

## 2018-10-18 DIAGNOSIS — Z923 Personal history of irradiation: Secondary | ICD-10-CM

## 2018-10-18 DIAGNOSIS — Z833 Family history of diabetes mellitus: Secondary | ICD-10-CM

## 2018-10-18 DIAGNOSIS — I5032 Chronic diastolic (congestive) heart failure: Secondary | ICD-10-CM | POA: Diagnosis not present

## 2018-10-18 DIAGNOSIS — M1611 Unilateral primary osteoarthritis, right hip: Secondary | ICD-10-CM | POA: Diagnosis not present

## 2018-10-18 DIAGNOSIS — I480 Paroxysmal atrial fibrillation: Secondary | ICD-10-CM | POA: Diagnosis not present

## 2018-10-18 DIAGNOSIS — E119 Type 2 diabetes mellitus without complications: Secondary | ICD-10-CM | POA: Diagnosis not present

## 2018-10-18 DIAGNOSIS — I110 Hypertensive heart disease with heart failure: Secondary | ICD-10-CM | POA: Diagnosis not present

## 2018-10-18 DIAGNOSIS — Z9841 Cataract extraction status, right eye: Secondary | ICD-10-CM

## 2018-10-18 DIAGNOSIS — Z8711 Personal history of peptic ulcer disease: Secondary | ICD-10-CM

## 2018-10-18 DIAGNOSIS — Z8249 Family history of ischemic heart disease and other diseases of the circulatory system: Secondary | ICD-10-CM

## 2018-10-18 DIAGNOSIS — Z91048 Other nonmedicinal substance allergy status: Secondary | ICD-10-CM

## 2018-10-18 DIAGNOSIS — I2510 Atherosclerotic heart disease of native coronary artery without angina pectoris: Secondary | ICD-10-CM | POA: Diagnosis not present

## 2018-10-18 DIAGNOSIS — Z419 Encounter for procedure for purposes other than remedying health state, unspecified: Secondary | ICD-10-CM

## 2018-10-18 DIAGNOSIS — Z7901 Long term (current) use of anticoagulants: Secondary | ICD-10-CM

## 2018-10-18 DIAGNOSIS — Z6839 Body mass index (BMI) 39.0-39.9, adult: Secondary | ICD-10-CM

## 2018-10-18 DIAGNOSIS — Z885 Allergy status to narcotic agent status: Secondary | ICD-10-CM

## 2018-10-18 DIAGNOSIS — Z8546 Personal history of malignant neoplasm of prostate: Secondary | ICD-10-CM

## 2018-10-18 DIAGNOSIS — E861 Hypovolemia: Secondary | ICD-10-CM | POA: Diagnosis not present

## 2018-10-18 DIAGNOSIS — Z7984 Long term (current) use of oral hypoglycemic drugs: Secondary | ICD-10-CM

## 2018-10-18 DIAGNOSIS — I712 Thoracic aortic aneurysm, without rupture: Secondary | ICD-10-CM | POA: Diagnosis not present

## 2018-10-18 DIAGNOSIS — Z9181 History of falling: Secondary | ICD-10-CM

## 2018-10-18 DIAGNOSIS — I9581 Postprocedural hypotension: Secondary | ICD-10-CM | POA: Diagnosis not present

## 2018-10-18 DIAGNOSIS — Z86711 Personal history of pulmonary embolism: Secondary | ICD-10-CM

## 2018-10-18 DIAGNOSIS — H9190 Unspecified hearing loss, unspecified ear: Secondary | ICD-10-CM | POA: Diagnosis present

## 2018-10-18 DIAGNOSIS — I440 Atrioventricular block, first degree: Secondary | ICD-10-CM | POA: Diagnosis not present

## 2018-10-18 DIAGNOSIS — Z880 Allergy status to penicillin: Secondary | ICD-10-CM

## 2018-10-18 DIAGNOSIS — Z87891 Personal history of nicotine dependence: Secondary | ICD-10-CM

## 2018-10-18 DIAGNOSIS — Z7951 Long term (current) use of inhaled steroids: Secondary | ICD-10-CM

## 2018-10-18 DIAGNOSIS — Z9852 Vasectomy status: Secondary | ICD-10-CM

## 2018-10-18 DIAGNOSIS — Z471 Aftercare following joint replacement surgery: Secondary | ICD-10-CM | POA: Diagnosis not present

## 2018-10-18 DIAGNOSIS — Z8701 Personal history of pneumonia (recurrent): Secondary | ICD-10-CM

## 2018-10-18 DIAGNOSIS — Z91040 Latex allergy status: Secondary | ICD-10-CM

## 2018-10-18 DIAGNOSIS — Z981 Arthrodesis status: Secondary | ICD-10-CM

## 2018-10-18 DIAGNOSIS — G4733 Obstructive sleep apnea (adult) (pediatric): Secondary | ICD-10-CM | POA: Diagnosis present

## 2018-10-18 DIAGNOSIS — Z79899 Other long term (current) drug therapy: Secondary | ICD-10-CM

## 2018-10-18 DIAGNOSIS — R001 Bradycardia, unspecified: Secondary | ICD-10-CM | POA: Diagnosis not present

## 2018-10-18 HISTORY — PX: TOTAL HIP ARTHROPLASTY: SHX124

## 2018-10-18 LAB — GLUCOSE, CAPILLARY
Glucose-Capillary: 135 mg/dL — ABNORMAL HIGH (ref 70–99)
Glucose-Capillary: 107 mg/dL — ABNORMAL HIGH (ref 70–99)
Glucose-Capillary: 94 mg/dL (ref 70–99)

## 2018-10-18 LAB — SURGICAL PCR SCREEN
MRSA, PCR: NEGATIVE
Staphylococcus aureus: NEGATIVE

## 2018-10-18 SURGERY — ARTHROPLASTY, HIP, TOTAL, ANTERIOR APPROACH
Anesthesia: Spinal | Site: Hip | Laterality: Right

## 2018-10-18 MED ORDER — EPHEDRINE SULFATE-NACL 50-0.9 MG/10ML-% IV SOSY
PREFILLED_SYRINGE | INTRAVENOUS | Status: DC | PRN
  Administered 2018-10-18 (×3): 5 mg via INTRAVENOUS

## 2018-10-18 MED ORDER — EPHEDRINE 5 MG/ML INJ
INTRAVENOUS | Status: AC
  Filled 2018-10-18: qty 10

## 2018-10-18 MED ORDER — LOSARTAN POTASSIUM 25 MG PO TABS
25.0000 mg | ORAL_TABLET | Freq: Every day | ORAL | Status: DC
  Administered 2018-10-18: 25 mg via ORAL
  Filled 2018-10-18 (×3): qty 1

## 2018-10-18 MED ORDER — DIPHENHYDRAMINE HCL 12.5 MG/5ML PO ELIX: 12.5000 mg | ORAL_SOLUTION | ORAL | Status: DC | PRN

## 2018-10-18 MED ORDER — ALBUTEROL SULFATE (2.5 MG/3ML) 0.083% IN NEBU: 3.0000 mL | INHALATION_SOLUTION | RESPIRATORY_TRACT | Status: DC | PRN

## 2018-10-18 MED ORDER — FENTANYL CITRATE (PF) 100 MCG/2ML IJ SOLN
INTRAMUSCULAR | Status: AC
  Filled 2018-10-18: qty 2

## 2018-10-18 MED ORDER — ONDANSETRON HCL 4 MG/2ML IJ SOLN
INTRAMUSCULAR | Status: DC | PRN
  Administered 2018-10-18: 4 mg via INTRAVENOUS

## 2018-10-18 MED ORDER — MENTHOL 3 MG MT LOZG: 1.0000 | LOZENGE | OROMUCOSAL | Status: DC | PRN

## 2018-10-18 MED ORDER — CLINDAMYCIN PHOSPHATE 600 MG/50ML IV SOLN
600.0000 mg | Freq: Four times a day (QID) | INTRAVENOUS | Status: AC
  Administered 2018-10-18 – 2018-10-19 (×2): 600 mg via INTRAVENOUS
  Filled 2018-10-18 (×2): qty 50

## 2018-10-18 MED ORDER — OXYCODONE HCL 5 MG PO TABS
5.0000 mg | ORAL_TABLET | ORAL | Status: DC | PRN
  Administered 2018-10-18: 10 mg via ORAL
  Administered 2018-10-21 – 2018-10-22 (×2): 5 mg via ORAL
  Administered 2018-10-22: 10 mg via ORAL
  Filled 2018-10-18: qty 2
  Filled 2018-10-18: qty 1
  Filled 2018-10-18 (×4): qty 2
  Filled 2018-10-18: qty 1
  Filled 2018-10-18: qty 2

## 2018-10-18 MED ORDER — TRANEXAMIC ACID-NACL 1000-0.7 MG/100ML-% IV SOLN
1000.0000 mg | INTRAVENOUS | Status: AC
  Administered 2018-10-18: 1000 mg via INTRAVENOUS
  Filled 2018-10-18: qty 100

## 2018-10-18 MED ORDER — POLYETHYLENE GLYCOL 3350 17 G PO PACK
17.0000 g | PACK | Freq: Every day | ORAL | Status: DC | PRN
  Administered 2018-10-19 – 2018-10-21 (×2): 17 g via ORAL
  Filled 2018-10-18 (×2): qty 1

## 2018-10-18 MED ORDER — METHOCARBAMOL 500 MG IVPB - SIMPLE MED
500.0000 mg | Freq: Four times a day (QID) | INTRAVENOUS | Status: DC | PRN
  Administered 2018-10-18: 500 mg via INTRAVENOUS
  Filled 2018-10-18: qty 50

## 2018-10-18 MED ORDER — PROPOFOL 10 MG/ML IV BOLUS
INTRAVENOUS | Status: AC
  Filled 2018-10-18: qty 20

## 2018-10-18 MED ORDER — TAMSULOSIN HCL 0.4 MG PO CAPS
0.4000 mg | ORAL_CAPSULE | Freq: Every evening | ORAL | Status: DC
  Administered 2018-10-18 – 2018-10-22 (×5): 0.4 mg via ORAL
  Filled 2018-10-18 (×5): qty 1

## 2018-10-18 MED ORDER — ALUM & MAG HYDROXIDE-SIMETH 200-200-20 MG/5ML PO SUSP
30.0000 mL | ORAL | Status: DC | PRN
  Filled 2018-10-18: qty 30

## 2018-10-18 MED ORDER — OXYCODONE HCL 5 MG/5ML PO SOLN: 5.0000 mg | Freq: Once | ORAL | Status: AC | PRN

## 2018-10-18 MED ORDER — BUPIVACAINE IN DEXTROSE 0.75-8.25 % IT SOLN
INTRATHECAL | Status: DC | PRN
  Administered 2018-10-18: 1.8 mL via INTRATHECAL

## 2018-10-18 MED ORDER — FUROSEMIDE 40 MG PO TABS
80.0000 mg | ORAL_TABLET | Freq: Every day | ORAL | Status: DC
  Administered 2018-10-19: 09:00:00 80 mg via ORAL
  Filled 2018-10-18: qty 2

## 2018-10-18 MED ORDER — CLINDAMYCIN PHOSPHATE 900 MG/50ML IV SOLN
900.0000 mg | INTRAVENOUS | Status: AC
  Administered 2018-10-18: 900 mg via INTRAVENOUS
  Filled 2018-10-18: qty 50

## 2018-10-18 MED ORDER — OXYCODONE HCL 5 MG PO TABS
5.0000 mg | ORAL_TABLET | Freq: Once | ORAL | Status: AC | PRN
  Administered 2018-10-18: 5 mg via ORAL

## 2018-10-18 MED ORDER — OXYCODONE HCL 5 MG PO TABS
10.0000 mg | ORAL_TABLET | ORAL | Status: DC | PRN
  Administered 2018-10-18 – 2018-10-19 (×3): 10 mg via ORAL
  Administered 2018-10-19: 15 mg via ORAL
  Administered 2018-10-23: 10 mg via ORAL
  Filled 2018-10-18: qty 3

## 2018-10-18 MED ORDER — VITAMIN D 25 MCG (1000 UNIT) PO TABS
5000.0000 [IU] | ORAL_TABLET | Freq: Every day | ORAL | Status: DC
  Administered 2018-10-18 – 2018-10-23 (×6): 5000 [IU] via ORAL
  Filled 2018-10-18 (×6): qty 5

## 2018-10-18 MED ORDER — DOCUSATE SODIUM 100 MG PO CAPS
100.0000 mg | ORAL_CAPSULE | Freq: Two times a day (BID) | ORAL | Status: DC
  Administered 2018-10-18 – 2018-10-23 (×8): 100 mg via ORAL
  Filled 2018-10-18 (×9): qty 1

## 2018-10-18 MED ORDER — SODIUM CHLORIDE 0.9 % IR SOLN
Status: DC | PRN
  Administered 2018-10-18: 1000 mL

## 2018-10-18 MED ORDER — METHOCARBAMOL 500 MG PO TABS
500.0000 mg | ORAL_TABLET | Freq: Four times a day (QID) | ORAL | Status: DC | PRN
  Administered 2018-10-19 – 2018-10-22 (×7): 500 mg via ORAL
  Filled 2018-10-18 (×7): qty 1

## 2018-10-18 MED ORDER — PROPOFOL 10 MG/ML IV BOLUS
INTRAVENOUS | Status: AC
  Filled 2018-10-18: qty 40

## 2018-10-18 MED ORDER — HYDROMORPHONE HCL 1 MG/ML IJ SOLN
0.2500 mg | INTRAMUSCULAR | Status: DC | PRN
  Administered 2018-10-18: 0.25 mg via INTRAVENOUS
  Administered 2018-10-18: 0.5 mg via INTRAVENOUS
  Administered 2018-10-18: 0.25 mg via INTRAVENOUS

## 2018-10-18 MED ORDER — COENZYME Q10 100 MG PO CAPS: 100.0000 mg | ORAL_CAPSULE | Freq: Three times a day (TID) | ORAL | Status: DC

## 2018-10-18 MED ORDER — METOPROLOL TARTRATE 25 MG PO TABS
12.5000 mg | ORAL_TABLET | Freq: Two times a day (BID) | ORAL | Status: DC
  Administered 2018-10-18 – 2018-10-23 (×8): 12.5 mg via ORAL
  Filled 2018-10-18 (×8): qty 1

## 2018-10-18 MED ORDER — TOPIRAMATE 25 MG PO TABS
50.0000 mg | ORAL_TABLET | Freq: Two times a day (BID) | ORAL | Status: DC
  Administered 2018-10-18 – 2018-10-23 (×10): 50 mg via ORAL
  Filled 2018-10-18 (×10): qty 2

## 2018-10-18 MED ORDER — METOCLOPRAMIDE HCL 5 MG PO TABS: 5.0000 mg | ORAL_TABLET | Freq: Three times a day (TID) | ORAL | Status: DC | PRN

## 2018-10-18 MED ORDER — ONDANSETRON HCL 4 MG/2ML IJ SOLN: 4.0000 mg | Freq: Once | INTRAMUSCULAR | Status: DC | PRN

## 2018-10-18 MED ORDER — HYDROMORPHONE HCL 1 MG/ML IJ SOLN: 0.5000 mg | INTRAMUSCULAR | Status: DC | PRN

## 2018-10-18 MED ORDER — MONTELUKAST SODIUM 10 MG PO TABS
10.0000 mg | ORAL_TABLET | Freq: Every day | ORAL | Status: DC
  Administered 2018-10-18 – 2018-10-22 (×5): 10 mg via ORAL
  Filled 2018-10-18 (×5): qty 1

## 2018-10-18 MED ORDER — ONDANSETRON HCL 4 MG/2ML IJ SOLN: 4.0000 mg | Freq: Four times a day (QID) | INTRAMUSCULAR | Status: DC | PRN

## 2018-10-18 MED ORDER — 0.9 % SODIUM CHLORIDE (POUR BTL) OPTIME
TOPICAL | Status: DC | PRN
  Administered 2018-10-18: 1000 mL

## 2018-10-18 MED ORDER — RIVAROXABAN 20 MG PO TABS
20.0000 mg | ORAL_TABLET | Freq: Every day | ORAL | Status: DC
  Administered 2018-10-19 – 2018-10-22 (×4): 20 mg via ORAL
  Filled 2018-10-18 (×2): qty 1
  Filled 2018-10-18 (×2): qty 2

## 2018-10-18 MED ORDER — STERILE WATER FOR IRRIGATION IR SOLN
Status: DC | PRN
  Administered 2018-10-18: 2000 mL

## 2018-10-18 MED ORDER — POVIDONE-IODINE 10 % EX SWAB
2.0000 "application " | Freq: Once | CUTANEOUS | Status: AC
  Administered 2018-10-18: 2 via TOPICAL

## 2018-10-18 MED ORDER — METOCLOPRAMIDE HCL 5 MG/ML IJ SOLN: 5.0000 mg | Freq: Three times a day (TID) | INTRAMUSCULAR | Status: DC | PRN

## 2018-10-18 MED ORDER — PROPOFOL 500 MG/50ML IV EMUL
INTRAVENOUS | Status: DC | PRN
  Administered 2018-10-18: 75 ug/kg/min via INTRAVENOUS

## 2018-10-18 MED ORDER — ACETAMINOPHEN 325 MG PO TABS
325.0000 mg | ORAL_TABLET | Freq: Four times a day (QID) | ORAL | Status: DC | PRN
  Administered 2018-10-20 – 2018-10-22 (×8): 650 mg via ORAL
  Filled 2018-10-18 (×9): qty 2

## 2018-10-18 MED ORDER — SPIRONOLACTONE 25 MG PO TABS
25.0000 mg | ORAL_TABLET | Freq: Every day | ORAL | Status: DC
  Administered 2018-10-18 – 2018-10-19 (×2): 25 mg via ORAL
  Filled 2018-10-18 (×3): qty 1

## 2018-10-18 MED ORDER — POTASSIUM CHLORIDE CRYS ER 20 MEQ PO TBCR
40.0000 meq | EXTENDED_RELEASE_TABLET | Freq: Every day | ORAL | Status: DC
  Administered 2018-10-19 – 2018-10-23 (×5): 40 meq via ORAL
  Filled 2018-10-18 (×5): qty 2

## 2018-10-18 MED ORDER — CHLORHEXIDINE GLUCONATE 4 % EX LIQD: 60.0000 mL | Freq: Once | CUTANEOUS | Status: DC

## 2018-10-18 MED ORDER — LACTATED RINGERS IV SOLN
INTRAVENOUS | Status: DC
  Administered 2018-10-18 (×2): via INTRAVENOUS

## 2018-10-18 MED ORDER — SODIUM CHLORIDE 0.9 % IV SOLN
INTRAVENOUS | Status: DC
  Administered 2018-10-18: 19:00:00 via INTRAVENOUS

## 2018-10-18 MED ORDER — FENTANYL CITRATE (PF) 100 MCG/2ML IJ SOLN
25.0000 ug | INTRAMUSCULAR | Status: DC | PRN
  Administered 2018-10-18 (×3): 50 ug via INTRAVENOUS

## 2018-10-18 MED ORDER — HYDROMORPHONE HCL 1 MG/ML IJ SOLN
INTRAMUSCULAR | Status: AC
  Filled 2018-10-18: qty 1

## 2018-10-18 MED ORDER — METHOCARBAMOL 500 MG IVPB - SIMPLE MED
INTRAVENOUS | Status: AC
  Filled 2018-10-18: qty 50

## 2018-10-18 MED ORDER — POTASSIUM CHLORIDE CRYS ER 20 MEQ PO TBCR
20.0000 meq | EXTENDED_RELEASE_TABLET | Freq: Every day | ORAL | Status: DC
  Administered 2018-10-18 – 2018-10-22 (×5): 20 meq via ORAL
  Filled 2018-10-18 (×5): qty 1

## 2018-10-18 MED ORDER — PANTOPRAZOLE SODIUM 40 MG PO TBEC
40.0000 mg | DELAYED_RELEASE_TABLET | Freq: Every day | ORAL | Status: DC
  Administered 2018-10-18 – 2018-10-23 (×6): 40 mg via ORAL
  Filled 2018-10-18 (×6): qty 1

## 2018-10-18 MED ORDER — ATORVASTATIN CALCIUM 10 MG PO TABS
5.0000 mg | ORAL_TABLET | Freq: Every day | ORAL | Status: DC
  Administered 2018-10-19 – 2018-10-22 (×4): 5 mg via ORAL
  Filled 2018-10-18 (×4): qty 1

## 2018-10-18 MED ORDER — CANAGLIFLOZIN 100 MG PO TABS
100.0000 mg | ORAL_TABLET | Freq: Every day | ORAL | Status: DC
  Administered 2018-10-19 – 2018-10-23 (×5): 100 mg via ORAL
  Filled 2018-10-18 (×5): qty 1

## 2018-10-18 MED ORDER — PHENOL 1.4 % MT LIQD: 1.0000 | OROMUCOSAL | Status: DC | PRN

## 2018-10-18 MED ORDER — FENTANYL CITRATE (PF) 100 MCG/2ML IJ SOLN
INTRAMUSCULAR | Status: DC | PRN
  Administered 2018-10-18: 50 ug via INTRAVENOUS

## 2018-10-18 MED ORDER — MOMETASONE FURO-FORMOTEROL FUM 100-5 MCG/ACT IN AERO: 2.0000 | INHALATION_SPRAY | Freq: Two times a day (BID) | RESPIRATORY_TRACT | Status: DC | PRN

## 2018-10-18 MED ORDER — OXYCODONE HCL 5 MG PO TABS
ORAL_TABLET | ORAL | Status: AC
  Filled 2018-10-18: qty 1

## 2018-10-18 MED ORDER — FUROSEMIDE 40 MG PO TABS: 40.0000 mg | ORAL_TABLET | ORAL | Status: DC

## 2018-10-18 MED ORDER — ONDANSETRON HCL 4 MG PO TABS: 4.0000 mg | ORAL_TABLET | Freq: Four times a day (QID) | ORAL | Status: DC | PRN

## 2018-10-18 MED ORDER — POTASSIUM CHLORIDE CRYS ER 20 MEQ PO TBCR: 20.0000 meq | EXTENDED_RELEASE_TABLET | ORAL | Status: DC

## 2018-10-18 MED ORDER — FUROSEMIDE 40 MG PO TABS
40.0000 mg | ORAL_TABLET | Freq: Every day | ORAL | Status: DC
  Administered 2018-10-18 – 2018-10-19 (×2): 40 mg via ORAL
  Filled 2018-10-18 (×2): qty 1

## 2018-10-18 MED ORDER — NITROGLYCERIN 0.4 MG SL SUBL: 0.4000 mg | SUBLINGUAL_TABLET | SUBLINGUAL | Status: DC | PRN

## 2018-10-18 SURGICAL SUPPLY — 39 items
BAG ZIPLOCK 12X15 (MISCELLANEOUS) ×1 IMPLANT
BALL HIP ARTICU EZE 36 8.5 (Hips) IMPLANT
BLADE SAW SGTL 18X1.27X75 (BLADE) ×2 IMPLANT
BLADE SURG SZ10 CARB STEEL (BLADE) ×4 IMPLANT
COVER PERINEAL POST (MISCELLANEOUS) ×2 IMPLANT
COVER SURGICAL LIGHT HANDLE (MISCELLANEOUS) ×2 IMPLANT
COVER WAND RF STERILE (DRAPES) IMPLANT
CUP ACET PNNCL SECTR W/GRIP 56 (Hips) IMPLANT
DRAPE STERI IOBAN 125X83 (DRAPES) ×2 IMPLANT
DRAPE U-SHAPE 47X51 STRL (DRAPES) ×4 IMPLANT
DRSG AQUACEL AG ADV 3.5X10 (GAUZE/BANDAGES/DRESSINGS) ×2 IMPLANT
DURAPREP 26ML APPLICATOR (WOUND CARE) ×2 IMPLANT
ELECT REM PT RETURN 15FT ADLT (MISCELLANEOUS) ×2 IMPLANT
GAUZE XEROFORM 1X8 LF (GAUZE/BANDAGES/DRESSINGS) ×2 IMPLANT
GLOVE BIO SURGEON STRL SZ7.5 (GLOVE) ×2 IMPLANT
GLOVE BIOGEL PI IND STRL 8 (GLOVE) ×2 IMPLANT
GLOVE BIOGEL PI INDICATOR 8 (GLOVE) ×2
GLOVE ECLIPSE 8.0 STRL XLNG CF (GLOVE) ×2 IMPLANT
GOWN STRL REUS W/TWL XL LVL3 (GOWN DISPOSABLE) ×4 IMPLANT
HANDPIECE INTERPULSE COAX TIP (DISPOSABLE) ×1
HIP BALL ARTICU EZE 36 8.5 (Hips) ×2 IMPLANT
HOLDER FOLEY CATH W/STRAP (MISCELLANEOUS) ×2 IMPLANT
KIT TURNOVER KIT A (KITS) IMPLANT
LINER NEUTRAL 52MMX36MMX56N (Liner) ×1 IMPLANT
PACK ANTERIOR HIP CUSTOM (KITS) ×2 IMPLANT
PINN SECTOR W/GRIP ACE CUP 56 (Hips) ×2 IMPLANT
SET HNDPC FAN SPRY TIP SCT (DISPOSABLE) ×1 IMPLANT
STAPLER VISISTAT 35W (STAPLE) ×1 IMPLANT
STEM FEMORAL SZ5 HIGH ACTIS (Stem) ×1 IMPLANT
STRIP CLOSURE SKIN 1/2X4 (GAUZE/BANDAGES/DRESSINGS) IMPLANT
SUT ETHIBOND NAB CT1 #1 30IN (SUTURE) ×2 IMPLANT
SUT ETHILON 2 0 PS N (SUTURE) IMPLANT
SUT MNCRL AB 4-0 PS2 18 (SUTURE) IMPLANT
SUT VIC AB 0 CT1 36 (SUTURE) ×2 IMPLANT
SUT VIC AB 1 CT1 36 (SUTURE) ×2 IMPLANT
SUT VIC AB 2-0 CT1 27 (SUTURE) ×2
SUT VIC AB 2-0 CT1 TAPERPNT 27 (SUTURE) ×2 IMPLANT
TRAY FOLEY MTR SLVR 16FR STAT (SET/KITS/TRAYS/PACK) ×1 IMPLANT
YANKAUER SUCT BULB TIP 10FT TU (MISCELLANEOUS) ×2 IMPLANT

## 2018-10-18 NOTE — H&P (Signed)
TOTAL HIP ADMISSION H&P  Patient is admitted for right total hip arthroplasty.  Subjective:  Chief Complaint: right hip pain  HPI: Carlos Dawson, 78 y.o. male, has a history of pain and functional disability in the right hip(s) due to arthritis and patient has failed non-surgical conservative treatments for greater than 12 weeks to include NSAID's and/or analgesics, corticosteriod injections, flexibility and strengthening excercises, supervised PT with diminished ADL's post treatment, use of assistive devices, weight reduction as appropriate and activity modification.  Onset of symptoms was gradual starting 3 years ago with gradually worsening course since that time.The patient noted no past surgery on the right hip(s).  Patient currently rates pain in the right hip at 10 out of 10 with activity. Patient has night pain, worsening of pain with activity and weight bearing, trendelenberg gait, pain that interfers with activities of daily living and pain with passive range of motion. Patient has evidence of subchondral cysts, subchondral sclerosis, periarticular osteophytes and joint space narrowing by imaging studies. This condition presents safety issues increasing the risk of falls.  There is no current active infection.  Patient Active Problem List   Diagnosis Date Noted  . Unilateral primary osteoarthritis, right hip 03/26/2018  . Atrial fibrillation with rapid ventricular response ([place]) 04/15/2017  . SOB (shortness of breath)   . Sleep apnea   . Prostate CA ([place])   . Pneumonia   . PAF (paroxysmal atrial fibrillation) ([place])   . Mild intermittent asthma   . IBS (irritable bowel syndrome)   . Hearing loss   . GERD (gastroesophageal reflux disease)   . Generalized headaches   . Diastolic dysfunction   . Contact lens/glasses fitting   . Diabetes mellitus without complication ([place])   . Colonic polyp   . Heart failure ([place])   . ASCVD (arteriosclerotic cardiovascular disease)   . Ascending  aortic aneurysm ([place])   . Aortic root enlargement ([place])   . S/P lumbar spinal fusion 10/18/2016  . Chest pain 09/15/2016  . Paroxysmal atrial fibrillation with RVR ([place]) 09/15/2016  . Hypotension 09/15/2016  . S/P lumbar laminectomy 05/04/2016  . Oral candidiasis 03/30/2016  . Pain in right hip 03/29/2016  . Lumbar radiculopathy 03/29/2016  . Post laminectomy syndrome 03/29/2016  . Severe obesity (BMI >= 40) ([place]) 04/26/2015  . Asthma with acute exacerbation 04/22/2015  . CAP (community acquired pneumonia) 08/12/2013  . Encounter for therapeutic drug monitoring 03/03/2013  . Chest pain at rest 01/06/2013  . Chronic diastolic heart failure ([place]) 12/26/2012  . HLD (hyperlipidemia) 12/26/2012  . Allergic rhinitis 12/24/2012  . Long term (current) use of anticoagulants 08/29/2012  . Lapband APL May 2009 10/25/2011  . Hypoxemia 12/08/2010  . Dyspnea 12/08/2010  . Hypertension   . Obesities, morbid ([place])   . Pulmonary embolism ([place])   . Anticoagulant long-term use   . CAD (coronary artery disease)   . Thoracic aortic aneurysm ([place])   . OA (osteoarthritis)   . LVH (left ventricular hypertrophy)   . Paroxysmal atrial fibrillation ([facility])   . Mild persistent asthma, well controlled 10/21/2008  . OSA (obstructive sleep apnea) 08/06/2008   Past Medical History:  Diagnosis Date  . Anticoagulant long-term use   . Aortic root enlargement ([place])   . Ascending aortic aneurysm [facility])    recent scan in October 2012 showing no change; followed by Dr. Servando Snare  . ASCVD (arteriosclerotic cardiovascular disease)    Prior BMS to the 2nd OM in September 2012; with repeat cath in October showing  patency  . CAD (coronary artery disease)    a. s/p BMS to 2nd OM in Sept 2012; b. LexiScan Myoview (12/2012):  Inf infarct; bowel and motion artifact make study difficult to interpret; no ischemia; not gated; Low Risk  . CHF (congestive heart failure) ([place])    no recent issues 10/13/14  . Chronic back pain     "all over my back" (05/11/2017)  . Colonic polyp   . Contact lens/glasses fitting   . Diastolic dysfunction   . DVT (deep venous thrombosis) ([place])    ?LLE  . Frequent headaches    "probably weekly" (05/11/2017)  . Generalized headaches    neck stenosis  . GERD (gastroesophageal reflux disease)   . Hearing loss   . Hearing loss    more so on left  . Hemorrhoids   . History of stomach ulcers   . Hypertension   . IBS (irritable bowel syndrome)   . LVH (left ventricular hypertrophy)   . Mild intermittent asthma   . OA (osteoarthritis)    "all over" (05/11/2017)  . Obesities, morbid ([place])   . OSA (obstructive sleep apnea)    PSG 03/30/97 AHI 21, BPAP 13/9  . OSA on CPAP   . PAF (paroxysmal atrial fibrillation) ([place])    a. on Xarelto b. s/p DCCV in 08/2016; b. Tikosyn failed 04/16/17 with plans for Multaq and possible Afib ablation with Dr. Rayann Heman  . Pneumonia    'several times" (05/11/2017)  . Prostate CA [facility])    Oncologist  DR. Daral[facility] Milch [facility] 09/24/14, undetermined tx   prostate; S/P "radiation and hormone injections"  . Pulmonary embolism ([place]) 2008   "both lungs"  . SOB (shortness of breath)    on excertion  . Thoracic aortic aneurysm ([facility])    Aortic Size Index=     5.0    /Body surface area is 2.43 meters squared. = 2.05  < 2.75 cm/m2      4% risk per year 2.75 to 4.25          8% risk per year > 4.25 cm/m2    20% risk per year   Stable aneurysmal dilation of the ascending aorta with maximum AP diameter of 4.8 cm. Stable area of narrowing of the proximal most portion of the descending aorta measuring 2 cm., previously identified as an area of coarctation. No evidence of aortic dissection.  Coronary artery disease.  Normal appearance of the lungs.   Electronically Signed   By: Fidela Salisbury M.D.   On: 10/01/2014 08:50    . Type II diabetes mellitus ([place])    metphormin, average 154 dx 2017    Past Surgical History:  Procedure Laterality Date  . ACHILLES TENDON REPAIR  Bilateral   . AORTIC ARCH ANGIOGRAPHY N/A 03/13/2017   Procedure: AORTIC ARCH ANGIOGRAPHY;  Surgeon: Martinique, [name], MD;  Location: [place] CV LAB;  Service: Cardiovascular;  Laterality: N/A;  . APPENDECTOMY    . ATRIAL FIBRILLATION ABLATION  05/11/2017  . ATRIAL FIBRILLATION ABLATION N/A 05/11/2017   Procedure: ATRIAL FIBRILLATION ABLATION;  Surgeon: [name], MD;  Location: [place] CV LAB;  Service: Cardiovascular;  Laterality: N/A;  . BACK SURGERY     "I've had 7 back and 1 neck ORs" (05/11/2017)  . BIOPSY  03/14/2018   Procedure: BIOPSY;  Surgeon: Ronnette Juniper, MD;  Location: [facility];  Service: Gastroenterology;;  EGD and Colon  . CARDIAC CATHETERIZATION  2006  . CARPAL TUNNEL RELEASE Bilateral  LEFT  . CATARACT EXTRACTION W/ INTRAOCULAR LENS  IMPLANT, BILATERAL Bilateral   . CERVICAL SPINE SURGERY  06/02/2010   lower back and neck  . COLONOSCOPY N/A 03/14/2018   Procedure: COLONOSCOPY;  Surgeon: Ronnette Juniper, MD;  Location: [facility];  Service: Gastroenterology;  Laterality: N/A;  . COLONOSCOPY WITH PROPOFOL N/A 12/29/2014   Procedure: COLONOSCOPY WITH PROPOFOL;  Surgeon: Garlan Fair, MD;  Location: [facility];  Service: Endoscopy;  Laterality: N/A;  . CORONARY ANGIOPLASTY WITH STENT PLACEMENT  October 2012  . CORONARY STENT PLACEMENT  Sept 2012   2nd OM with BMS  . ESOPHAGOGASTRODUODENOSCOPY N/A 03/14/2018   Procedure: ESOPHAGOGASTRODUODENOSCOPY (EGD);  Surgeon: Ronnette Juniper, MD;  Location[facility];  Service: Gastroenterology;  Laterality: N/A;  . HEMORROIDECTOMY    . LAMINECTOMY  05/30/2012   L 4 L5  . LAMINECTOMY WITH POSTERIOR LATERAL ARTHRODESIS LEVEL 3 N/A 10/18/2016   Procedure: Posterior Lateral Fusion - Lumbar One-Four, segmental instrumentation Lumbar One-Five,  decompression,;  Surgeon: Eustace Moore, MD;  Location: [facility];  Service: Neurosurgery;  Laterality: N/A;  . LAPAROSCOPIC CHOLECYSTECTOMY    . LAPAROSCOPIC GASTRIC BANDING    . LEFT AND  RIGHT HEART CATHETERIZATION WITH CORONARY ANGIOGRAM N/A 05/07/2014   Procedure: LEFT AND RIGHT HEART CATHETERIZATION WITH CORONARY ANGIOGRAM;  Surgeon: [name] Martinique, MD;  Location: [facility] C[facility] LAB;  Service: Cardiovascular;  Laterality: N/A;  . LEFT HEART CATH AND CORONARY ANGIOGRAPHY N/A 03/13/2017   Procedure: LEFT HEART CATH AND CORONARY ANGIOGRAPHY;  Surgeon: Martinique, [name], MD;  Location: [place] CV LAB;  Service: Cardiovascular;  Laterality: N/A;  . LUMBAR LAMINECTOMY/DECOMPRESSION MICRODISCECTOMY N/A 05/04/2016   Procedure: Laminectomy and Foraminotomy - Thoracic twelve-Lumbar one -Posterior Fusion Lumbar one-two;  Surgeon: Eustace Moore, MD;  Location: [place];  Service: Neurosurgery;  Laterality: N/A;  . POLYPECTOMY  03/14/2018   Procedure: POLYPECTOMY;  Surgeon: Ronnette Juniper, MD;  Location: [facility];  Service: Gastroenterology;;  . POSTERIOR LUMBAR FUSION  10/18/2016  . SHOULDER OPEN ROTATOR CUFF REPAIR Bilateral   . TONSILLECTOMY AND ADENOIDECTOMY    . TRIGGER FINGER RELEASE     LEFT  . UVULOPALATOPHARYNGOPLASTY    . VASECTOMY      No current facility-administered medications for this encounter.    Current Outpatient Medications  Medication Sig Dispense Refill Last Dose  . acetaminophen (TYLENOL) 500 MG tablet Take 1,000 mg by mouth every 8 (eight) hours as needed for mild pain or headache.      . albuterol (PROVENTIL HFA;VENTOLIN HFA) 108 (90 Base) MCG/ACT inhaler Inhale 2 puffs into the lungs every 4 (four) hours as needed for wheezing or shortness of breath.     Marland Kitchen atorvastatin (LIPITOR) 10 MG tablet Take 5 mg by mouth daily at 6 PM.      . augmented betamethasone dipropionate (DIPROLENE-AF) 0.05 % cream Apply 1 application topically 2 (two) times daily as needed for itching.  3   . budesonide-formoterol (SYMBICORT) 80-4.5 MCG/ACT inhaler Inhale 2 puffs into the lungs 2 (two) times daily as needed (for shortness of breath).      . celecoxib (CELEBREX) 200 MG capsule Take 200 mg  by mouth 2 (two) times daily as needed for moderate pain.  0   . Cholecalciferol (VITAMIN D-3) 5000 units TABS Take 5,000 Units by mouth daily.     . Coenzyme Q10 100 MG capsule Take 100 mg by mouth 3 (three) times daily.     . furosemide (LASIX) 80 MG tablet Take 40-80  mg by mouth See admin instructions. Take 80 mg in the morning and 40 mg in the evening      . JARDIANCE 25 MG TABS tablet Take 1 tablet by mouth daily.     Marland Kitchen losartan (COZAAR) 25 MG tablet Take 1 tablet (25 mg total) by mouth daily. 90 tablet 1   . methocarbamol (ROBAXIN) 500 MG tablet Take 1 tablet (500 mg total) by mouth every 8 (eight) hours as needed for muscle spasms. 60 tablet 1   . metoprolol tartrate (LOPRESSOR) 25 MG tablet Take 12.5 mg by mouth 2 (two) times daily.     . montelukast (SINGULAIR) 10 MG tablet Take 10 mg by mouth at bedtime.     . nitroGLYCERIN (NITROSTAT) 0.4 MG SL tablet Place 1 tablet (0.4 mg total) under the tongue every 5 (five) minutes as needed for chest pain. 25 tablet 5   . omeprazole (PRILOSEC) 20 MG capsule Take 20 mg by mouth daily.     Vladimir Faster Glycol-Propyl Glycol (SYSTANE OP) Place 1 drop into both eyes daily as needed (for dry eyes).      . potassium chloride SA (KLOR-CON M20) 20 MEQ tablet Take 2 tablets (40 mEq total) by mouth 2 (two) times daily. (Patient taking differently: Take 20-40 mEq by mouth See admin instructions. 2 tabs in the morning, 1 tab in the evening) 360 tablet 2   . psyllium (METAMUCIL) 58.6 % packet Take 1 packet by mouth daily.     . rivaroxaban (XARELTO) 20 MG TABS tablet Take 1 tablet (20 mg total) by mouth daily with supper. 30 tablet    . Skin Protectants, Misc. (EUCERIN) cream Apply 1 application topically as needed for dry skin.     Marland Kitchen spironolactone (ALDACTONE) 25 MG tablet Take 25 mg by mouth daily.     . tamsulosin (FLOMAX) 0.4 MG CAPS Take 0.4 mg by mouth every evening.      . topiramate (TOPAMAX) 25 MG capsule Take 50 mg by mouth 2 (two) times daily.      .  traMADol (ULTRAM) 50 MG tablet Take 50 mg by mouth every 12 (twelve) hours as needed for moderate pain.      . sildenafil (VIAGRA) 100 MG tablet Take 100 mg by mouth as needed for erectile dysfunction.       Allergies  Allergen Reactions  . Ace Inhibitors Cough  . Amoxicillin-Pot Clavulanate     Other reaction(s): GI Upset (intolerance)  . Quinolones     Patient was warned about not using Cipro and similar antibiotics. Recent studies have raised concern that fluoroquinolone antibiotics could be associated with an increased risk of aortic aneurysm Fluoroquinolones have non-antimicrobial properties that might jeopardise the integrity of the extracellular matrix of the vascular wall In a  propensity score matched cohort study in [place], there was a 66% increased rate of aortic aneurysm or dissection associated with oral fluoroquinolone use, compared wit  . Adhesive [Tape] Itching and Rash  . Latex Itching, Rash and Other (See Comments)    Bandaids  . Morphine Itching    Social History   Tobacco Use  . Smoking status: Former Smoker    Packs/day: 1.50    Years: 30.00    Pack years: 45.00    Quit date: 01/24/1992    Years since quitting: 26.7  . Smokeless tobacco: Never Used  Substance Use Topics  . Alcohol use: No    Family History  Problem Relation Age of Onset  . Heart disease  Mother   . Diabetes Mother   . Other Mother        stent placement  . Emphysema Father 46  . Heart attack Sister      Review of Systems  Musculoskeletal: Positive for back pain and joint pain.  All other systems reviewed and are negative.   Objective:  Physical Exam  Constitutional: He is oriented to person, place, and time. He appears well-developed and well-nourished.  HENT:  Head: Normocephalic and atraumatic.  Eyes: Pupils are equal, round, and reactive to light. EOM are normal.  Neck: Normal range of motion. Neck supple.  Cardiovascular: Normal rate.  Respiratory: Effort normal.  GI:  Soft.  Musculoskeletal:     Right hip: He exhibits decreased range of motion, decreased strength, tenderness and bony tenderness.  Neurological: He is alert and oriented to person, place, and time.  Skin: Skin is warm and dry.  Psychiatric: He has a normal mood and affect.    Vital signs in last 24 hours:    Labs:   Estimated body mass index is 39.11 kg/m as calculated from the following:   Height as of 10/15/18: 5\' 5"  (1.651 m).   Weight as of 10/15/18: 106.6 kg.   Imaging Review Plain radiographs demonstrate severe degenerative joint disease of the right hip(s). The bone quality appears to be good for age and reported activity level.      Assessment/Plan:  End stage arthritis, right hip(s)  The patient history, physical examination, clinical judgement of the provider and imaging studies are consistent with end stage degenerative joint disease of the right hip(s) and total hip arthroplasty is deemed medically necessary. The treatment options including medical management, injection therapy, arthroscopy and arthroplasty were discussed at length. The risks and benefits of total hip arthroplasty were presented and reviewed. The risks due to aseptic loosening, infection, stiffness, dislocation/subluxation,  thromboembolic complications and other imponderables were discussed.  The patient acknowledged the explanation, agreed to proceed with the plan and consent was signed. Patient is being admitted for inpatient treatment for surgery, pain control, PT, OT, prophylactic antibiotics, VTE prophylaxis, progressive ambulation and ADL's and discharge planning.The patient is planning to be discharged home with home health services

## 2018-10-18 NOTE — Care Plan (Signed)
Ortho Bundle Case Management Note  Patient Details  Name: Carlos Dawson MRN: [id number] Date of Birth: [birth year]   [facility] spoke with patient prior to surgery to review Ortho bundle information for his Right THA. He has a wife that will be assisting after discharge home. He currently is staying at a hotel due to his house being sold and [place]. He and wife staying at [facility]. [place]. [phone]. Room #122. They will be staying her after discharge from the hospital until his F/U with Dr. Ninfa Linden 2 weeks post-op on 10/31/18. Anticipate HHPT per [facility]. Choice provided and liaison made aware. Patient will need home DME (3in1 and FWW). Referral made to [facility]. Will continue to follow for further CM needs.                                DME Arranged:  Gilford Rile rolling, 3-N-1 DME Agency:  [facility]  HH Arranged:  PT [place] Agency:  [facility] (now [facility])  Additional Comments: Please contact me with any questions of if this plan should need to change.  Jamse Arn, RN, BSN, [employer]  [phone] 10/18/2018, 2:50 PM

## 2018-10-18 NOTE — Op Note (Signed)
NAME: Carlos Dawson, Carlos Dawson MEDICAL RECORD [id number] ACCOUNT [id number] DATE OF BIRTH:1940-06-08 FACILITY: [facility] LOCATION: [facility] PHYSICIAN:Pami Wool [place], MD  OPERATIVE REPORT  DATE OF PROCEDURE:  10/18/2018  PREOPERATIVE DIAGNOSIS:  Primary osteoarthritis and degenerative joint disease, right hip.  POSTOPERATIVE DIAGNOSIS:  Primary osteoarthritis and degenerative joint disease, right hip.  PROCEDURE:  Right total hip arthroplasty through direct anterior approach.  IMPLANTS:  DePuy Sector Gription acetabular component size 56, size 36+0 neutral polyethylene liner, size 5 Actis femoral component with high offset, size 36+8.5 metal hip ball.  SURGEON:  Lind Guest. Ninfa Linden, MD  ASSISTANT:  Erskine Emery, PA-C  ANESTHESIA:  Spinal.  ANTIBIOTICS:  Two grams IV Ancef.  ESTIMATED BLOOD LOSS:  300-350 mL.  COMPLICATIONS:  None.  INDICATIONS:  The patient is a 78 year old gentleman with debilitating arthritis involving his right hip.  He has tried and failed all forms of conservative treatment.  At this point, his x-rays do show significant arthritis in his right hip and his pain  is daily and it is detrimentally affecting his mobility, his quality of life and his activities of daily living to the point he does wish to proceed with total hip arthroplasty.  With that being said, he understands the risk of acute blood loss anemia,  nerve or vessel injury, fracture, infection, dislocation, DVT and implant failure.  He understands our goals are to decrease pain, improve mobility and overall improve quality of life.  DESCRIPTION OF PROCEDURE:  After informed consent was obtained and appropriate right hip was marked, he was brought to the operating room and sat up on the stretcher while spinal anesthesia was obtained.  He was then laid in the supine position on a  stretcher.  Foley catheter was placed and we were able to place traction boots on both his feet.  Next, he was placed  supine on the Hana fracture table, the perineal post in place and both legs in line skeletal traction device and no traction applied.   His right operative hip was prepped and draped with DuraPrep and sterile drapes.  A time-out was called.  He was identified as correct patient, correct right hip.  We then made an incision just inferior and posterior to the anterior superior iliac spine  and carried this obliquely down the leg.  Dissected down tensor fascia lata muscle.  Tensor fascia was then divided longitudinally to proceed with direct anterior approach to the hip.  We identified and cauterized circumflex vessels.  I then identified  the hip capsule, opened the hip capsule in an L-type format, finding moderate joint effusion and significant osteoarthritis around his right hip.  There were periarticular osteophytes and sclerotic changes.  We placed Cobra retractors around the medial  and lateral femoral neck and made our femoral neck cut just proximal to the lesser trochanter with an oscillating saw and completed this with an osteotome.  I placed a corkscrew guide in the femoral head and removed the femoral head in its entirety and  found it to be devoid of cartilage.  I then placed a bent Hohmann over the medial acetabular rim and removed remnants of the acetabular labrum and periarticular osteophytes from around the hip.  We then began reaming under direct visualization from a  size 44 reamer in stepwise increments up to a size 55 with all reamers under direct visualization, the last reamer under direct fluoroscopy, so we could obtain our depth in reaming our inclination and anteversion.  I then placed the real EchoStar  Gription acetabular component size 56 and a 36+0 neutral polyethylene because it did not seem we had medialized him a whole lot.  We then had attention turned to the femur.  With the leg externally rotated to 120 degrees, extended and adducted, we were  able to place a Mueller  retractor medially and Hohman retractor behind the greater trochanter, released the lateral joint capsule and used a box-cutting osteotome to enter the femoral canal and a rongeur to lateralize.  We then began broaching using the  Actis broaching system from the starter broach, then to zero up to a size 5.  With a size 5 in place, we tried a high offset femoral neck and went with a 36+5 hip ball because of the lower neck cut.  We reduced this in the acetabulum and definitely  needed a little bit more offset and leg length.  We dislocated the hip and removed the trial components.  We placed the real high offset femoral component, size 5 and then we went with a 36+8.5 metal hip ball.  We reduced this in the acetabulum and it  definitely felt stable throughout its arc of motion and we had improved his leg length and offset.  We then irrigated the soft tissue with normal saline solution using pulsatile lavage.  We closed the joint capsule with interrupted #1 Ethibond suture,  followed by closing the tensor fascia with #1 Vicryl, 0 Vicryl was used to close deep tissue, 2-0 Vicryl was used to close the subcutaneous tissue and interrupted staples were placed on the skin.  Xeroform and well-padded sterile dressing and an Aquacel  dressing was applied.  He was taken off the Hana table and taken to recovery room in stable condition.  All final counts were correct.  There were no complications noted.  Of note, Benita Stabile, PA-C, assisted in the entire case.  His assistance was crucial  for facilitating all aspects of this case.  TN/NUANCE  D:10/18/2018 T:10/18/2018 JOB:[id number]

## 2018-10-18 NOTE — Anesthesia Procedure Notes (Signed)
Procedure Name: MAC Date/Time: 10/18/2018 12:30 PM Performed by: Maxwell Caul, CRNA Pre-anesthesia Checklist: Patient identified, Emergency Drugs available, Suction available and Patient being monitored Patient Re-evaluated:Patient Re-evaluated prior to induction Oxygen Delivery Method: Simple face mask

## 2018-10-18 NOTE — Transfer of Care (Signed)
Immediate Anesthesia Transfer of Care Note  Patient: Carlos Dawson  Procedure(s) Performed: RIGHT TOTAL HIP ARTHROPLASTY ANTERIOR APPROACH (Right Hip)  Patient Location: PACU  Anesthesia Type:Spinal  Level of Consciousness: awake, alert  and oriented  Airway & Oxygen Therapy: Patient Spontanous Breathing and Patient connected to face mask oxygen  Post-op Assessment: Report given to RN and Post -op Vital signs reviewed and stable  Post vital signs: Reviewed and stable  Last Vitals:  Vitals Value Taken Time  BP    Temp    Pulse 78 10/18/18 1416  Resp 10 10/18/18 1416  SpO2 99 % 10/18/18 1416  Vitals shown include unvalidated device data.  Last Pain:  Vitals:   10/18/18 1016  TempSrc:   PainSc: 3       Patients Stated Pain Goal: 4 ([id number])  Complications: No apparent anesthesia complications

## 2018-10-18 NOTE — Care Plan (Signed)
Ortho Bundle Case Management Note  Patient Details  Name: Carlos Dawson MRN: [id number] Date of Birth: [birth year]  [facility] spoke with patient prior to surgery to review Ortho bundle information for his Right THA. He has a wife that will be assisting after discharge home. He currently is staying at a hotel due to his house being sold and [place]. He and wife staying at [facility] at [facility]. [place]. [phone]. Room #122. They will be staying her after discharge from the hospital until his F/U with Dr. Ninfa Linden 2 weeks post-op on 10/31/18. Anticipate HHPT per [facility]. Choice provided and liaison made aware. Patient will need home DME (3in1 and FWW). Referral made to [place]. Will continue to follow for further CM needs.                   DME Arranged:  3-N-1, Walker rolling DME Agency:  [facility]  HH Arranged:  PT [place] Agency:  [facility] (now [facility])  Additional Comments: Please contact me with any questions of if this plan should need to change.  Jamse Arn, RN, BSN, [employer]  [phone] 10/18/2018, 2:38 PM

## 2018-10-18 NOTE — Brief Op Note (Signed)
10/18/2018  2:01 PM  PATIENT:  [name]  78 y.o. male  PRE-OPERATIVE DIAGNOSIS:  osteoarthritis right hip  POST-OPERATIVE DIAGNOSIS:  osteoarthritis right hip  PROCEDURE:  Procedure(s): RIGHT TOTAL HIP ARTHROPLASTY ANTERIOR APPROACH (Right)  SURGEON:  Surgeon(s) and Role:    Mcarthur Rossetti, MD - Primary  PHYSICIAN ASSISTANT: Benita Stabile, PA-C  ANESTHESIA:   spinal  EBL:  200 mL   COUNTS:  YES  DICTATION: .Other Dictation: Dictation Number [id number]  PLAN OF CARE: Admit to inpatient   PATIENT DISPOSITION:  PACU - hemodynamically stable.   Delay start of Pharmacological VTE agent (>24hrs) due to surgical blood loss or risk of bleeding: no

## 2018-10-19 DIAGNOSIS — I110 Hypertensive heart disease with heart failure: Secondary | ICD-10-CM | POA: Diagnosis not present

## 2018-10-19 DIAGNOSIS — I9589 Other hypotension: Secondary | ICD-10-CM | POA: Diagnosis not present

## 2018-10-19 DIAGNOSIS — M1611 Unilateral primary osteoarthritis, right hip: Secondary | ICD-10-CM | POA: Diagnosis not present

## 2018-10-19 DIAGNOSIS — Z6839 Body mass index (BMI) 39.0-39.9, adult: Secondary | ICD-10-CM | POA: Diagnosis not present

## 2018-10-19 DIAGNOSIS — I10 Essential (primary) hypertension: Secondary | ICD-10-CM | POA: Diagnosis not present

## 2018-10-19 DIAGNOSIS — E861 Hypovolemia: Secondary | ICD-10-CM | POA: Diagnosis not present

## 2018-10-19 DIAGNOSIS — I2510 Atherosclerotic heart disease of native coronary artery without angina pectoris: Secondary | ICD-10-CM | POA: Diagnosis not present

## 2018-10-19 DIAGNOSIS — R079 Chest pain, unspecified: Secondary | ICD-10-CM | POA: Diagnosis not present

## 2018-10-19 DIAGNOSIS — R001 Bradycardia, unspecified: Secondary | ICD-10-CM | POA: Diagnosis not present

## 2018-10-19 DIAGNOSIS — I5032 Chronic diastolic (congestive) heart failure: Secondary | ICD-10-CM | POA: Diagnosis not present

## 2018-10-19 DIAGNOSIS — K219 Gastro-esophageal reflux disease without esophagitis: Secondary | ICD-10-CM | POA: Diagnosis not present

## 2018-10-19 DIAGNOSIS — I440 Atrioventricular block, first degree: Secondary | ICD-10-CM | POA: Diagnosis not present

## 2018-10-19 DIAGNOSIS — E119 Type 2 diabetes mellitus without complications: Secondary | ICD-10-CM | POA: Diagnosis not present

## 2018-10-19 DIAGNOSIS — Z9181 History of falling: Secondary | ICD-10-CM | POA: Diagnosis not present

## 2018-10-19 DIAGNOSIS — G8929 Other chronic pain: Secondary | ICD-10-CM | POA: Diagnosis present

## 2018-10-19 DIAGNOSIS — D62 Acute posthemorrhagic anemia: Secondary | ICD-10-CM | POA: Diagnosis not present

## 2018-10-19 DIAGNOSIS — Z96641 Presence of right artificial hip joint: Secondary | ICD-10-CM | POA: Diagnosis not present

## 2018-10-19 DIAGNOSIS — R0789 Other chest pain: Secondary | ICD-10-CM | POA: Diagnosis not present

## 2018-10-19 DIAGNOSIS — I959 Hypotension, unspecified: Secondary | ICD-10-CM | POA: Diagnosis not present

## 2018-10-19 DIAGNOSIS — E785 Hyperlipidemia, unspecified: Secondary | ICD-10-CM | POA: Diagnosis not present

## 2018-10-19 DIAGNOSIS — I25118 Atherosclerotic heart disease of native coronary artery with other forms of angina pectoris: Secondary | ICD-10-CM | POA: Diagnosis not present

## 2018-10-19 DIAGNOSIS — G4733 Obstructive sleep apnea (adult) (pediatric): Secondary | ICD-10-CM | POA: Diagnosis present

## 2018-10-19 DIAGNOSIS — Z981 Arthrodesis status: Secondary | ICD-10-CM | POA: Diagnosis not present

## 2018-10-19 DIAGNOSIS — I712 Thoracic aortic aneurysm, without rupture: Secondary | ICD-10-CM | POA: Diagnosis not present

## 2018-10-19 DIAGNOSIS — I4891 Unspecified atrial fibrillation: Secondary | ICD-10-CM | POA: Diagnosis not present

## 2018-10-19 DIAGNOSIS — Z9852 Vasectomy status: Secondary | ICD-10-CM | POA: Diagnosis not present

## 2018-10-19 DIAGNOSIS — H9190 Unspecified hearing loss, unspecified ear: Secondary | ICD-10-CM | POA: Diagnosis present

## 2018-10-19 DIAGNOSIS — I9581 Postprocedural hypotension: Secondary | ICD-10-CM | POA: Diagnosis not present

## 2018-10-19 DIAGNOSIS — M549 Dorsalgia, unspecified: Secondary | ICD-10-CM | POA: Diagnosis present

## 2018-10-19 DIAGNOSIS — I480 Paroxysmal atrial fibrillation: Secondary | ICD-10-CM | POA: Diagnosis not present

## 2018-10-19 LAB — BASIC METABOLIC PANEL
Anion gap: 5 (ref 5–15)
BUN: 18 mg/dL (ref 8–23)
CO2: 24 mmol/L (ref 22–32)
Calcium: 8 mg/dL — ABNORMAL LOW (ref 8.9–10.3)
Chloride: 107 mmol/L (ref 98–111)
Creatinine, Ser: 0.91 mg/dL (ref 0.61–1.24)
GFR calc Af Amer: >60 mL/min (ref >60)
GFR calc non Af Amer: >60 mL/min (ref >60)
Glucose, Bld: 160 mg/dL — ABNORMAL HIGH (ref 70–99)
Potassium: 3.7 mmol/L (ref 3.5–5.1)
Sodium: 136 mmol/L (ref 135–145)

## 2018-10-19 LAB — CBC
HCT: 32.8 % — ABNORMAL LOW (ref 39.0–52.0)
Hemoglobin: 10.3 g/dL — ABNORMAL LOW (ref 13.0–17.0)
MCH: 33.2 pg (ref 26.0–34.0)
MCHC: 31.4 g/dL (ref 30.0–36.0)
MCV: 105.8 fL — ABNORMAL HIGH (ref 80.0–100.0)
Platelets: 129 10*3/uL — ABNORMAL LOW (ref 150–400)
RBC: 3.1 MIL/uL — ABNORMAL LOW (ref 4.22–5.81)
RDW: 14.3 % (ref 11.5–15.5)
WBC: 7.2 10*3/uL (ref 4.0–10.5)
nRBC: 0 % (ref 0.0–0.2)

## 2018-10-19 LAB — GLUCOSE, CAPILLARY: Glucose-Capillary: 196 mg/dL — ABNORMAL HIGH (ref 70–99)

## 2018-10-19 LAB — CBC WITH DIFFERENTIAL/PLATELET
Abs Immature Granulocytes: 0.06 10*3/uL (ref 0.00–0.07)
Basophils Absolute: 0 10*3/uL (ref 0.0–0.1)
Basophils Relative: 0 %
Eosinophils Absolute: 0 10*3/uL (ref 0.0–0.5)
Eosinophils Relative: 0 %
HCT: 32 % — ABNORMAL LOW (ref 39.0–52.0)
Hemoglobin: 10 g/dL — ABNORMAL LOW (ref 13.0–17.0)
Immature Granulocytes: 1 %
Lymphocytes Relative: 17 %
Lymphs Abs: 1.6 10*3/uL (ref 0.7–4.0)
MCH: 32.7 pg (ref 26.0–34.0)
MCHC: 31.3 g/dL (ref 30.0–36.0)
MCV: 104.6 fL — ABNORMAL HIGH (ref 80.0–100.0)
Monocytes Absolute: 1.2 10*3/uL — ABNORMAL HIGH (ref 0.1–1.0)
Monocytes Relative: 12 %
Neutro Abs: 6.8 10*3/uL (ref 1.7–7.7)
Neutrophils Relative %: 70 %
Platelets: 129 10*3/uL — ABNORMAL LOW (ref 150–400)
RBC: 3.06 MIL/uL — ABNORMAL LOW (ref 4.22–5.81)
RDW: 14.4 % (ref 11.5–15.5)
WBC: 9.7 10*3/uL (ref 4.0–10.5)
nRBC: 0 % (ref 0.0–0.2)

## 2018-10-19 MED ORDER — SODIUM CHLORIDE 0.9 % IV BOLUS
250.0000 mL | Freq: Once | INTRAVENOUS | Status: AC
  Administered 2018-10-19: 250 mL via INTRAVENOUS

## 2018-10-19 MED ORDER — SODIUM CHLORIDE 0.9 % IV SOLN
INTRAVENOUS | Status: DC
  Administered 2018-10-19 – 2018-10-21 (×2): via INTRAVENOUS

## 2018-10-19 MED ORDER — FERROUS GLUCONATE 324 (38 FE) MG PO TABS
324.0000 mg | ORAL_TABLET | Freq: Two times a day (BID) | ORAL | Status: DC
  Administered 2018-10-22 – 2018-10-23 (×2): 324 mg via ORAL
  Filled 2018-10-19 (×9): qty 1

## 2018-10-19 NOTE — TOC Progression Note (Signed)
Transition of Care [facility]) - Progression Note    Patient Details  Name: Carlos Dawson MRN: [id number] Date of Birth: 06/20/1940  [facility]) CM/SW Contact  Joaquin Courts, RN Phone Number: 10/19/2018, 11:29 AM  Clinical Narrative:    [facility] arranged to deliver rolling walker and 3-in-1 to bedside for home use. [facility] to provide HHPT (patient staying at [facility] [address]).         Expected Discharge Plan and Services                           DME Arranged: Walker rolling, 3-N-1 DME Agency: [facility]       HH Arranged: PT [place] Agency: [facility] (now [facility])         Social Determinants of Health (SDOH) Interventions    Readmission Risk Interventions No flowsheet data found.

## 2018-10-19 NOTE — Plan of Care (Signed)
  Problem: Education: Goal: Knowledge of General Education information will improve Description Including pain rating scale, medication(s)/side effects and non-pharmacologic comfort measures Outcome: Progressing   Problem: Health Behavior/Discharge Planning: Goal: Ability to manage health-related needs will improve Outcome: Progressing   Problem: Clinical Measurements: Goal: Ability to maintain clinical measurements within normal limits will improve Outcome: Progressing Goal: Will remain free from infection Outcome: Progressing Goal: Diagnostic test results will improve Outcome: Progressing Goal: Respiratory complications will improve Outcome: Progressing Goal: Cardiovascular complication will be avoided Outcome: Progressing   Problem: Activity: Goal: Risk for activity intolerance will decrease Outcome: Progressing   Problem: Nutrition: Goal: Adequate nutrition will be maintained Outcome: Progressing   Problem: Coping: Goal: Level of anxiety will decrease Outcome: Progressing   Problem: Elimination: Goal: Will not experience complications related to bowel motility Outcome: Progressing Goal: Will not experience complications related to urinary retention Outcome: Progressing   Problem: Pain Managment: Goal: General experience of comfort will improve Outcome: Progressing   Problem: Safety: Goal: Ability to remain free from injury will improve Outcome: Progressing   Problem: Skin Integrity: Goal: Risk for impaired skin integrity will decrease Outcome: Progressing   Problem: Education: Goal: Knowledge of the prescribed therapeutic regimen will improve Outcome: Progressing Goal: Understanding of discharge needs will improve Outcome: Progressing Goal: Individualized Educational Video(s) Outcome: Progressing   Problem: Activity: Goal: Ability to avoid complications of mobility impairment will improve Outcome: Progressing Goal: Ability to tolerate increased  activity will improve Outcome: Progressing   Problem: Clinical Measurements: Goal: Postoperative complications will be avoided or minimized Outcome: Progressing   Problem: Pain Management: Goal: Pain level will decrease with appropriate interventions Outcome: Progressing   Problem: Skin Integrity: Goal: Will show signs of wound healing Outcome: Progressing   Problem: Education: Goal: Knowledge of General Education information will improve Description Including pain rating scale, medication(s)/side effects and non-pharmacologic comfort measures Outcome: Progressing   Problem: Health Behavior/Discharge Planning: Goal: Ability to manage health-related needs will improve Outcome: Progressing   Problem: Clinical Measurements: Goal: Ability to maintain clinical measurements within normal limits will improve Outcome: Progressing Goal: Will remain free from infection Outcome: Progressing Goal: Diagnostic test results will improve Outcome: Progressing Goal: Respiratory complications will improve Outcome: Progressing Goal: Cardiovascular complication will be avoided Outcome: Progressing   Problem: Activity: Goal: Risk for activity intolerance will decrease Outcome: Progressing   Problem: Nutrition: Goal: Adequate nutrition will be maintained Outcome: Progressing   Problem: Coping: Goal: Level of anxiety will decrease Outcome: Progressing   Problem: Elimination: Goal: Will not experience complications related to bowel motility Outcome: Progressing Goal: Will not experience complications related to urinary retention Outcome: Progressing   Problem: Pain Managment: Goal: General experience of comfort will improve Outcome: Progressing   Problem: Safety: Goal: Ability to remain free from injury will improve Outcome: Progressing   Problem: Skin Integrity: Goal: Risk for impaired skin integrity will decrease Outcome: Progressing   Problem: Education: Goal: Knowledge  of the prescribed therapeutic regimen will improve Outcome: Progressing Goal: Understanding of discharge needs will improve Outcome: Progressing Goal: Individualized Educational Video(s) Outcome: Progressing   Problem: Activity: Goal: Ability to avoid complications of mobility impairment will improve Outcome: Progressing Goal: Ability to tolerate increased activity will improve Outcome: Progressing   Problem: Clinical Measurements: Goal: Postoperative complications will be avoided or minimized Outcome: Progressing   Problem: Pain Management: Goal: Pain level will decrease with appropriate interventions Outcome: Progressing   Problem: Skin Integrity: Goal: Will show signs of wound healing Outcome: Progressing   

## 2018-10-19 NOTE — Evaluation (Signed)
Physical Therapy Evaluation Patient Details Name: Carlos Dawson MRN: [id number] DOB: May 03, 1940 Today's Date: 10/19/2018   History of Present Illness  78 yo male s/p R THA-direct anterior 9/25. Hx of multiple back surgeries, DM, Af ib  Clinical Impression  On eval, pt required Min assist for mobility. He walked ~60 feet with a RW. Moderate pain with activity. Will follow and progress activity as tolerated. D/C plan is for home with HHPT f/u.     Follow Up Recommendations Follow surgeon's recommendation for DC plan and follow-up therapies    Equipment Recommendations  Rolling walker with 5" wheels;3in1 (PT)    Recommendations for Other Services       Precautions / Restrictions Precautions Precautions: Fall Restrictions Weight Bearing Restrictions: No Other Position/Activity Restrictions: WBAT      Mobility  Bed Mobility               General bed mobility comments: oob in recliner  Transfers Overall transfer level: Needs assistance Equipment used: Rolling walker (2 wheeled) Transfers: Sit to/from Stand Sit to Stand: Min assist         General transfer comment: Increased time. VCs safety, technique, hand placement. Small amount of assist to rise, steady, control descent.  Ambulation/Gait Ambulation/Gait assistance: Min assist Gait Distance (Feet): 60 Feet Assistive device: Rolling walker (2 wheeled) Gait Pattern/deviations: Step-to pattern;Step-through pattern;Decreased stride length     General Gait Details: Slow, antalgic gait pattern that improved some as distance increased. Vcs safety, technique, sequence. Followed with recliner for safety.  Stairs            Wheelchair Mobility    Modified Rankin (Stroke Patients Only)       Balance Overall balance assessment: Needs assistance         Standing balance support: Bilateral upper extremity supported Standing balance-Leahy Scale: Poor                                Pertinent Vitals/Pain Pain Assessment: 0-10 Pain Score: 8  Pain Location: R hip/thigh Pain Descriptors / Indicators: Discomfort;Grimacing;Guarding;Burning Pain Intervention(s): Limited activity within patient's tolerance;Monitored during session;Ice applied;Repositioned    Home Living Family/patient expects to be discharged to:: Other (Comment)(hotel) Living Arrangements: Spouse/significant other               Additional Comments: in between home (selling/buying)-staying in a hotel after d/c from hospital    Prior Function Level of Independence: Independent               Hand Dominance        Extremity/Trunk Assessment   Upper Extremity Assessment Upper Extremity Assessment: Overall WFL for tasks assessed    Lower Extremity Assessment Lower Extremity Assessment: Generalized weakness(post op THA weakness)    Cervical / Trunk Assessment Cervical / Trunk Assessment: Normal  Communication   Communication: No difficulties  Cognition Arousal/Alertness: Awake/alert Behavior During Therapy: WFL for tasks assessed/performed Overall Cognitive Status: Within Functional Limits for tasks assessed                                        General Comments      Exercises Total Joint Exercises Ankle Circles/Pumps: AROM;Both;10 reps;Seated Quad Sets: AROM;Both;10 reps Heel Slides: AAROM;Right;10 reps;Supine Hip ABduction/ADduction: AAROM;Right;10 reps;Seated   Assessment/Plan    PT Assessment Patient needs continued PT services  PT  Problem List Decreased strength;Decreased mobility;Decreased range of motion;Decreased activity tolerance;Decreased balance;Decreased knowledge of use of DME;Pain       PT Treatment Interventions DME instruction;Gait training;Therapeutic exercise;Therapeutic activities;Patient/family education;Balance training;Functional mobility training;Stair training    PT Goals (Current goals can be found in the Care Plan  section)  Acute Rehab PT Goals Patient Stated Goal: less pain. regain independence/plof PT Goal Formulation: With patient Time For Goal Achievement: 11/02/18 Potential to Achieve Goals: Good    Frequency 7X/week   Barriers to discharge        Co-evaluation               AM-PAC PT "6 Clicks" Mobility  Outcome Measure Help needed turning from your back to your side while in a flat bed without using bedrails?: A Little Help needed moving from lying on your back to sitting on the side of a flat bed without using bedrails?: A Little Help needed moving to and from a bed to a chair (including a wheelchair)?: A Little Help needed standing up from a chair using your arms (e.g., wheelchair or bedside chair)?: A Little Help needed to walk in hospital room?: A Little Help needed climbing 3-5 steps with a railing? : A Little 6 Click Score: 18    End of Session Equipment Utilized During Treatment: Gait belt Activity Tolerance: Patient limited by pain;Patient limited by fatigue Patient left: in chair;with call bell/phone within reach   PT Visit Diagnosis: Unsteadiness on feet (R26.81);Pain;Other abnormalities of gait and mobility (R26.89) Pain - Right/Left: Right Pain - part of body: Hip    Time: 1039-1100 PT Time Calculation (min) (ACUTE ONLY): 21 min   Charges:   PT Evaluation $PT Eval Low Complexity: [place], PT Acute Rehabilitation Services Pager: [phone] Office: [phone]

## 2018-10-19 NOTE — Progress Notes (Signed)
Physical Therapy Treatment Patient Details Name: Carlos Dawson MRN: [id number] DOB: [birth year] Today's Date: 10/19/2018    History of Present Illness 78 yo male s/p R THA-direct anterior 9/25. Hx of multiple back surgeries, DM, Af ib    PT Comments    Progressing with mobility. Moderate pain with activity.    Follow Up Recommendations  Follow surgeon's recommendation for DC plan and follow-up therapies     Equipment Recommendations  Rolling walker with 5" wheels;3in1 (PT)    Recommendations for Other Services       Precautions / Restrictions Precautions Precautions: Fall Restrictions Weight Bearing Restrictions: No Other Position/Activity Restrictions: WBAT    Mobility  Bed Mobility Overal bed mobility: Needs Assistance Bed Mobility: Sit to Supine       Sit to supine: Min assist;HOB elevated   General bed mobility comments: Assist for R LE.  Transfers Overall transfer level: Needs assistance Equipment used: Rolling walker (2 wheeled) Transfers: Sit to/from Stand Sit to Stand: Min assist         General transfer comment: Increased time. VCs safety, technique, hand placement. Small amount of assist to rise, steady, control descent.  Ambulation/Gait Ambulation/Gait assistance: Min assist Gait Distance (Feet): 60 Feet Assistive device: Rolling walker (2 wheeled) Gait Pattern/deviations: Step-to pattern;Step-through pattern;Decreased stride length     General Gait Details: Slow, antalgic gait pattern that improved some as distance increased. Vcs safety, technique, sequence. Followed with recliner for safety.   Stairs             Wheelchair Mobility    Modified Rankin (Stroke Patients Only)       Balance Overall balance assessment: Needs assistance         Standing balance support: Bilateral upper extremity supported Standing balance-Leahy Scale: Poor                              Cognition Arousal/Alertness:  Awake/alert Behavior During Therapy: WFL for tasks assessed/performed Overall Cognitive Status: Within Functional Limits for tasks assessed                                        Exercises Total Joint Exercises Ankle Circles/Pumps: AROM;Both;10 reps;Seated Quad Sets: AROM;Both;10 reps Heel Slides: AAROM;Right;10 reps;Supine Hip ABduction/ADduction: AAROM;Right;10 reps;Seated    General Comments        Pertinent Vitals/Pain Pain Assessment: 0-10 Pain Score: 7  Pain Location: R hip/thigh Pain Descriptors / Indicators: Discomfort;Grimacing;Guarding;Burning Pain Intervention(s): Limited activity within patient's tolerance;Monitored during session;Ice applied;Repositioned    Home Living Family/patient expects to be discharged to:: Other (Comment)(hotel) Living Arrangements: Spouse/significant other             Additional Comments: in between home (selling/buying)-staying in a hotel after d/c from hospital    Prior Function Level of Independence: Independent          PT Goals (current goals can now be found in the care plan section) Acute Rehab PT Goals Patient Stated Goal: less pain. regain independence/plof PT Goal Formulation: With patient Time For Goal Achievement: 11/02/18 Potential to Achieve Goals: Good Progress towards PT goals: Progressing toward goals    Frequency    7X/week      PT Plan Current plan remains appropriate    Co-evaluation              AM-PAC PT "6 Clicks" Mobility  Outcome Measure  Help needed turning from your back to your side while in a flat bed without using bedrails?: A Little Help needed moving from lying on your back to sitting on the side of a flat bed without using bedrails?: A Little Help needed moving to and from a bed to a chair (including a wheelchair)?: A Little Help needed standing up from a chair using your arms (e.g., wheelchair or bedside chair)?: A Little Help needed to walk in hospital  room?: A Little Help needed climbing 3-5 steps with a railing? : A Little 6 Click Score: 18    End of Session Equipment Utilized During Treatment: Gait belt Activity Tolerance: Patient limited by pain;Patient limited by fatigue Patient left: in chair;with call bell/phone within reach   PT Visit Diagnosis: Unsteadiness on feet (R26.81);Pain;Other abnormalities of gait and mobility (R26.89) Pain - Right/Left: Right Pain - part of body: Hip     Time: [id number] PT Time Calculation (min) (ACUTE ONLY): 22 min  Charges:  $Gait Training: 8-22 mins                        Weston Anna, [place] Pager: [phone] Office: [phone]

## 2018-10-19 NOTE — Progress Notes (Signed)
     Subjective: 1 Day Post-Op Procedure(s) (LRB): RIGHT TOTAL HIP ARTHROPLASTY ANTERIOR APPROACH (Right)Awake, alert and oriented x 4. Between homes locally, living in [place] as his local home is sold and is building a new residence in [place].   Patient reports pain as moderate.    Objective:   VITALS:  Temp:  [97.5 F (36.4 C)-99.1 F (37.3 C)] 99.1 F (37.3 C) (09/26 0637) Pulse Rate:  [47-110] 81 (09/26 0637) Resp:  [10-20] 18 (09/26 0637) BP: (81-129)/(52-86) 102/68 (09/26 0637) SpO2:  [94 %-100 %] 95 % (09/26 [id number]) Weight:  [106.6 kg] 106.6 kg (09/25 1758)  Neurologically intact ABD soft Neurovascular intact Sensation intact distally Intact pulses distally Dorsiflexion/Plantar flexion intact Incision: dressing C/D/I and no drainage No cellulitis present Compartment soft   LABS Recent Labs    10/19/18 0245  HGB 10.3*  WBC 7.2  PLT 129*   Recent Labs    10/19/18 0245  NA 136  K 3.7  CL 107  CO2 24  BUN 18  CREATININE 0.91  GLUCOSE 160*   No results for input(s): LABPT, INR in the last 72 hours.   Assessment/Plan: 1 Day Post-Op Procedure(s) (LRB): RIGHT TOTAL HIP ARTHROPLASTY ANTERIOR APPROACH (Right)  Advance diet Up with therapy D/C IV fluids Plan for discharge tomorrow  Basil Dess 10/19/2018, 9:14 AMPatient ID: Carlos Dawson, male   DOB: 1940-09-03, 78 y.o.   MRN: [id number]

## 2018-10-20 DIAGNOSIS — I4891 Unspecified atrial fibrillation: Secondary | ICD-10-CM

## 2018-10-20 DIAGNOSIS — I5032 Chronic diastolic (congestive) heart failure: Secondary | ICD-10-CM

## 2018-10-20 LAB — CBC WITH DIFFERENTIAL/PLATELET
Abs Immature Granulocytes: 0.04 10*3/uL (ref 0.00–0.07)
Basophils Absolute: 0 10*3/uL (ref 0.0–0.1)
Basophils Relative: 0 %
Eosinophils Absolute: 0.1 10*3/uL (ref 0.0–0.5)
Eosinophils Relative: 1 %
HCT: 30.3 % — ABNORMAL LOW (ref 39.0–52.0)
Hemoglobin: 9.6 g/dL — ABNORMAL LOW (ref 13.0–17.0)
Immature Granulocytes: 1 %
Lymphocytes Relative: 14 %
Lymphs Abs: 1.2 10*3/uL (ref 0.7–4.0)
MCH: 32.8 pg (ref 26.0–34.0)
MCHC: 31.7 g/dL (ref 30.0–36.0)
MCV: 103.4 fL — ABNORMAL HIGH (ref 80.0–100.0)
Monocytes Absolute: 1.1 10*3/uL — ABNORMAL HIGH (ref 0.1–1.0)
Monocytes Relative: 13 %
Neutro Abs: 6 10*3/uL (ref 1.7–7.7)
Neutrophils Relative %: 71 %
Platelets: 123 10*3/uL — ABNORMAL LOW (ref 150–400)
RBC: 2.93 MIL/uL — ABNORMAL LOW (ref 4.22–5.81)
RDW: 14.3 % (ref 11.5–15.5)
WBC: 8.3 10*3/uL (ref 4.0–10.5)
nRBC: 0.2 % (ref 0.0–0.2)

## 2018-10-20 LAB — BASIC METABOLIC PANEL
Anion gap: 9 (ref 5–15)
BUN: 23 mg/dL (ref 8–23)
CO2: 20 mmol/L — ABNORMAL LOW (ref 22–32)
Calcium: 8.1 mg/dL — ABNORMAL LOW (ref 8.9–10.3)
Chloride: 104 mmol/L (ref 98–111)
Creatinine, Ser: 1.12 mg/dL (ref 0.61–1.24)
GFR calc Af Amer: >60 mL/min (ref >60)
GFR calc non Af Amer: >60 mL/min (ref >60)
Glucose, Bld: 199 mg/dL — ABNORMAL HIGH (ref 70–99)
Potassium: 3.7 mmol/L (ref 3.5–5.1)
Sodium: 133 mmol/L — ABNORMAL LOW (ref 135–145)

## 2018-10-20 MED ORDER — SODIUM CHLORIDE 0.45 % IV BOLUS
500.0000 mL | Freq: Once | INTRAVENOUS | Status: AC
  Administered 2018-10-20: 500 mL via INTRAVENOUS

## 2018-10-20 NOTE — Progress Notes (Signed)
New orders received for 250cc  NS bolus and to restart continuous IV at 75 cc. CBC and BMET also ordered

## 2018-10-20 NOTE — Progress Notes (Addendum)
     Subjective: 2 Days Post-Op Procedure(s) (LRB): RIGHT TOTAL HIP ARTHROPLASTY ANTERIOR APPROACH (Right)Awake, alert and oriented x 4. Up to chair. Decreased BP yesterday, Hgb 9.6 down form 10.3 yesterday. Has history of cardiac condition with a thoracic aneurysm that is judged recently to be stable at 5 cm and followed by Dr. Jones Skene. He wish to have his antihypertension meds but his  BP is not elevated. Passing gas, today, not much yesterday.  No BMET this AM.   Patient reports pain as moderate.    Objective:   VITALS:  Temp:  [98.5 F (36.9 C)-99.1 F (37.3 C)] 99 F (37.2 C) (09/27 0433) Pulse Rate:  [61-95] 77 (09/27 0433) Resp:  [15-18] 16 (09/27 0433) BP: (86-109)/(42-70) 88/42 (09/27 0841) SpO2:  [93 %-94 %] 93 % (09/27 0433)  Neurologically intact ABD soft Neurovascular intact Sensation intact distally Intact pulses distally Dorsiflexion/Plantar flexion intact Incision: dressing C/D/I and no drainage No cellulitis present Compartment soft Some hypesthetic pain right anterior thigh skin   LABS Recent Labs    10/19/18 0245 10/19/18 2201 10/20/18 0228  HGB 10.3* 10.0* 9.6*  WBC 7.2 9.7 8.3  PLT 129* 129* 123*   Recent Labs    10/19/18 0245 10/19/18 2201  NA 136 133*  K 3.7 3.7  CL 107 104  CO2 24 20*  BUN 18 23  CREATININE 0.91 1.12  GLUCOSE 160* 199*   No results for input(s): LABPT, INR in the last 72 hours.   Assessment/Plan: 2 Days Post-Op Procedure(s) (LRB): RIGHT TOTAL HIP ARTHROPLASTY ANTERIOR APPROACH (Right)  Anemia, mild Hgb 9.6 Decreased Blood pressure, likely hypovolemia post surgery will request a [facility] consult as he has a cardiac history.   Advance diet Up with therapy  Fluid bolus with NS 500 cc over 1 hour and increase IVF rate to 100 cc/hr. [facility] consult History of afib, thoracic aneursym, PE in the past.  Hospitalist contacted.  Basil Dess 10/20/2018, 10:31 AMPatient ID: Carlos Dawson, male    DOB: 12/08/1940, 78 y.o.   MRN: [id number]

## 2018-10-20 NOTE — Consult Note (Signed)
History and Physical    Carlos Dawson [id number] DOB: 01/24/1940 DOA: 10/18/2018  PCP: Josetta Huddle, MD Patient coming from: Home  Reason for consult: Hypotension Consulting physician: Basil Dess, MD  HPI: Carlos Dawson is a 78 y.o. male with medical history significant of CAD, atrial fibrillation, diastolic heart failure, history of DVT, ascending aortic aneurysm.  Patient presented secondary to elective right hip replacement.  This was performed on 9/25.  Patient's postop period has been complicated by some mild hypotension with systolic blood pressures as low as 85 and diastolic blood pressures as low as 43.  Patient has been on IV fluids.  He has an associated acute blood loss anemia likely from recent surgery which is mild.  Patient is on multiple antihypertensive secondary to history of ascending aortic aneurysm in addition to diastolic heart failure.  He also has history of atrial fibrillation on Eliquis and beta-blocker.  Patient reports no symptoms related to his blood pressure.  He states that his doctors try to keep his blood pressure on the lower side secondary to his ascending aortic aneurysm.  Review of Systems: Review of Systems  Constitutional: Negative for chills and fever.  Respiratory: Negative for shortness of breath.   Cardiovascular: Negative for chest pain.  Gastrointestinal: Negative for nausea and vomiting.  Neurological: Negative for dizziness, focal weakness and loss of consciousness.  All other systems reviewed and are negative.   Past Medical History:  Diagnosis Date   Anticoagulant long-term use    Aortic root enlargement (HCC)    Ascending aortic aneurysm ([place])    recent scan in October 2012 showing no change; followed by Dr. Servando Snare   ASCVD (arteriosclerotic cardiovascular disease)    Prior BMS to the 2nd OM in September 2012; with repeat cath in October showing patency   CAD (coronary artery disease)    a. s/p BMS to 2nd OM in Sept  2012; b. LexiScan Myoview (12/2012):  Inf infarct; bowel and motion artifact make study difficult to interpret; no ischemia; not gated; Low Risk   CHF (congestive heart failure) (HCC)    no recent issues 10/13/14   Chronic back pain    "all over my back" (05/11/2017)   Colonic polyp    Contact lens/glasses fitting    Diastolic dysfunction    DVT (deep venous thrombosis) ([place])    ?LLE   Frequent headaches    "probably weekly" (05/11/2017)   Generalized headaches    neck stenosis   GERD (gastroesophageal reflux disease)    Hearing loss    Hearing loss    more so on left   Hemorrhoids    History of stomach ulcers    Hypertension    IBS (irritable bowel syndrome)    LVH (left ventricular hypertrophy)    Mild intermittent asthma    OA (osteoarthritis)    "all over" (05/11/2017)   Obesities, morbid (HCC)    OSA (obstructive sleep apnea)    PSG 03/30/97 AHI 21, BPAP 13/9   OSA on CPAP    PAF (paroxysmal atrial fibrillation) ([place])    a. on Xarelto b. s/p DCCV in 08/2016; b. Tikosyn failed 04/16/17 with plans for Multaq and possible Afib ablation with Dr. Rayann Heman   Pneumonia    'several times" (05/11/2017)   Prostate CA [facility])    Oncologist  DR. Daral[facility] 09/24/14, undetermined tx   prostate; S/P "radiation and hormone injections"   Pulmonary embolism ([place]) 2008   "both lungs"   SOB (shortness of  breath)    on excertion   Thoracic aortic aneurysm (HCC)    Aortic Size Index=     5.0    /Body surface area is 2.43 meters squared. = 2.05  < 2.75 cm/m2      4% risk per year 2.75 to 4.25          8% risk per year > 4.25 cm/m2    20% risk per year   Stable aneurysmal dilation of the ascending aorta with maximum AP diameter of 4.8 cm. Stable area of narrowing of the proximal most portion of the descending aorta measuring 2 cm., previously identified as an area of coarctation. No evidence of aortic dissection.  Coronary artery disease.  Normal appearance of the  lungs.   Electronically Signed   By: Fidela Salisbury M.D.   On: 10/01/2014 08:50     Type II diabetes mellitus ([place])    metphormin, average 154 dx 2017    Past Surgical History:  Procedure Laterality Date   ACHILLES TENDON REPAIR Bilateral    AORTIC ARCH ANGIOGRAPHY N/A 03/13/2017   Procedure: AORTIC ARCH ANGIOGRAPHY;  Surgeon: Martinique, [name], MD;  Location: [place];  Service: Cardiovascular;  Laterality: N/A;   APPENDECTOMY     ATRIAL FIBRILLATION ABLATION  05/11/2017   ATRIAL FIBRILLATION ABLATION N/A 05/11/2017   Procedure: ATRIAL FIBRILLATION ABLATION;  Surgeon: [name], MD;  Location: [place] [facility];  Service: Cardiovascular;  Laterality: N/A;   BACK SURGERY     "I've had 7 back and 1 neck ORs" (05/11/2017)   BIOPSY  03/14/2018   Procedure: BIOPSY;  Surgeon: Ronnette Juniper, MD;  Location: [facility];  Service: Gastroenterology;;  EGD and Colon   CARDIAC CATHETERIZATION  2006   CARPAL TUNNEL RELEASE Bilateral    LEFT   CATARACT EXTRACTION W/ INTRAOCULAR LENS  IMPLANT, BILATERAL Bilateral    CERVICAL SPINE SURGERY  06/02/2010   lower back and neck   COLONOSCOPY N/A 03/14/2018   Procedure: COLONOSCOPY;  Surgeon: Ronnette Juniper, MD;  Location: [facility];  Service: Gastroenterology;  Laterality: N/A;   COLONOSCOPY WITH PROPOFOL N/A 12/29/2014   Procedure: COLONOSCOPY WITH PROPOFOL;  Surgeon: Garlan Fair, MD;  Location: [facility];  Service: Endoscopy;  Laterality: N/A;   CORONARY ANGIOPLASTY WITH STENT PLACEMENT  October 2012   CORONARY STENT PLACEMENT  Sept 2012   2nd OM with BMS   ESOPHAGOGASTRODUODENOSCOPY N/A 03/14/2018   Procedure: ESOPHAGOGASTRODUODENOSCOPY (EGD);  Surgeon: Ronnette Juniper, MD;  Location: [facility];  Service: Gastroenterology;  Laterality: N/A;   HEMORROIDECTOMY     LAMINECTOMY  05/30/2012   L 4 L5   LAMINECTOMY WITH POSTERIOR LATERAL ARTHRODESIS LEVEL 3 N/A 10/18/2016   Procedure: Posterior Lateral Fusion - Lumbar  One-Four, segmental instrumentation Lumbar One-Five,  decompression,;  Surgeon: Eustace Moore, MD;  Location: [place];  Service: Neurosurgery;  Laterality: N/A;   LAPAROSCOPIC CHOLECYSTECTOMY     LAPAROSCOPIC GASTRIC BANDING     LEFT AND RIGHT HEART CATHETERIZATION WITH CORONARY ANGIOGRAM N/A 05/07/2014   Procedure: LEFT AND RIGHT HEART CATHETERIZATION WITH CORONARY ANGIOGRAM;  Surgeon: [name] Martinique, MD;  Location: [facility] C[facility] LAB;  Service: Cardiovascular;  Laterality: N/A;   LEFT HEART CATH AND CORONARY ANGIOGRAPHY N/A 03/13/2017   Procedure: LEFT HEART CATH AND CORONARY ANGIOGRAPHY;  Surgeon: Martinique, [name], MD;  Location: [place] [facility];  Service: Cardiovascular;  Laterality: N/A;   LUMBAR LAMINECTOMY/DECOMPRESSION MICRODISCECTOMY N/A 05/04/2016   Procedure: Laminectomy and Foraminotomy - Thoracic  twelve-Lumbar one -Posterior Fusion Lumbar one-two;  Surgeon: Eustace Moore, MD;  Location: [place];  Service: Neurosurgery;  Laterality: N/A;   POLYPECTOMY  03/14/2018   Procedure: POLYPECTOMY;  Surgeon: Ronnette Juniper, MD;  Location: [facility];  Service: Gastroenterology;;   POSTERIOR LUMBAR FUSION  10/18/2016   SHOULDER OPEN ROTATOR CUFF REPAIR Bilateral    TONSILLECTOMY AND ADENOIDECTOMY     TRIGGER FINGER RELEASE     LEFT   UVULOPALATOPHARYNGOPLASTY     VASECTOMY       reports that he quit smoking about 26 years ago. He has a 45.00 pack-year smoking history. He has never used smokeless tobacco. He reports that he does not drink alcohol or use drugs.  Allergies  Allergen Reactions   Ace Inhibitors Cough   Amoxicillin-Pot Clavulanate     Other reaction(s): GI Upset (intolerance)   Quinolones     Patient was warned about not using Cipro and similar antibiotics. Recent studies have raised concern that fluoroquinolone antibiotics could be associated with an increased risk of aortic aneurysm Fluoroquinolones have non-antimicrobial properties that might jeopardise the integrity of  the extracellular matrix of the vascular wall In a  propensity score matched cohort study in [place], there was a 66% increased rate of aortic aneurysm or dissection associated with oral fluoroquinolone use, compared wit   Adhesive [Tape] Itching and Rash   Latex Itching, Rash and Other (See Comments)    Bandaids   Morphine Itching    Family History  Problem Relation Age of Onset   Heart disease Mother    Diabetes Mother    Other Mother        stent placement   Emphysema Father 32   Heart attack Sister    Prior to Admission medications   Medication Sig Start Date End Date Taking? Authorizing Provider  acetaminophen (TYLENOL) 500 MG tablet Take 1,000 mg by mouth every 8 (eight) hours as needed for mild pain or headache.    Yes [provider]  albuterol (PROVENTIL HFA;VENTOLIN HFA) 108 (90 Base) MCG/ACT inhaler Inhale 2 puffs into the lungs every 4 (four) hours as needed for wheezing or shortness of breath.   Yes [provider]  atorvastatin (LIPITOR) 10 MG tablet Take 5 mg by mouth daily at 6 PM.    Yes [provider]  augmented betamethasone dipropionate (DIPROLENE-AF) 0.05 % cream Apply 1 application topically 2 (two) times daily as needed for itching. 01/31/17  Yes [provider]  budesonide-formoterol (SYMBICORT) 80-4.5 MCG/ACT inhaler Inhale 2 puffs into the lungs 2 (two) times daily as needed (for shortness of breath).    Yes [provider]  Cholecalciferol (VITAMIN D-3) 5000 units TABS Take 5,000 Units by mouth daily.   Yes [provider]  Coenzyme Q10 100 MG capsule Take 100 mg by mouth 3 (three) times daily.   Yes [provider]  furosemide (LASIX) 80 MG tablet Take 40-80 mg by mouth See admin instructions. Take 80 mg in the morning and 40 mg in the evening    Yes [provider]  JARDIANCE 25 MG TABS tablet Take 1 tablet by mouth daily. 04/18/18  Yes [provider]  losartan (COZAAR) 25  MG tablet Take 1 tablet (25 mg total) by mouth daily. 09/03/18  Yes Martinique, [name], MD  metoprolol tartrate (LOPRESSOR) 25 MG tablet Take 12.5 mg by mouth 2 (two) times daily.   Yes [provider]  montelukast (SINGULAIR) 10 MG tablet Take 10 mg by  mouth at bedtime.   Yes [provider]  nitroGLYCERIN (NITROSTAT) 0.4 MG SL tablet Place 1 tablet (0.4 mg total) under the tongue every 5 (five) minutes as needed for chest pain. 09/15/16  Yes Carlota Raspberry, Tiffany, PA-C  omeprazole (PRILOSEC) 20 MG capsule Take 20 mg by mouth daily.   Yes [provider]  Polyethyl Glycol-Propyl Glycol (SYSTANE OP) Place 1 drop into both eyes daily as needed (for dry eyes).    Yes [provider]  potassium chloride SA (KLOR-CON M20) 20 MEQ tablet Take 2 tablets (40 mEq total) by mouth 2 (two) times daily. Patient taking differently: Take 20-40 mEq by mouth See admin instructions. 2 tabs in the morning, 1 tab in the evening 04/26/18  Yes Martinique, [name], MD  psyllium (METAMUCIL) 58.6 % packet Take 1 packet by mouth daily.   Yes [provider]  rivaroxaban (XARELTO) 20 MG TABS tablet Take 1 tablet (20 mg total) by mouth daily with supper. 03/14/17  Yes Martinique, [name], MD  Skin Protectants, Misc. (EUCERIN) cream Apply 1 application topically as needed for dry skin.   Yes [provider]  spironolactone (ALDACTONE) 25 MG tablet Take 25 mg by mouth daily.   Yes [provider]  tamsulosin (FLOMAX) 0.4 MG CAPS Take 0.4 mg by mouth every evening.    Yes [provider]  topiramate (TOPAMAX) 25 MG capsule Take 50 mg by mouth 2 (two) times daily.    Yes [provider]  traMADol (ULTRAM) 50 MG tablet Take 50 mg by mouth every 12 (twelve) hours as needed for moderate pain.  09/22/14  Yes [provider]  celecoxib (CELEBREX) 200 MG capsule Take 200 mg by mouth 2 (two) times daily as needed for moderate pain. 04/19/17   [provider]   methocarbamol (ROBAXIN) 500 MG tablet Take 1 tablet (500 mg total) by mouth every 8 (eight) hours as needed for muscle spasms. 10/20/16   Eustace Moore, MD  sildenafil (VIAGRA) 100 MG tablet Take 100 mg by mouth as needed for erectile dysfunction.  05/23/10   [provider]    Physical Exam:  Physical Exam Constitutional:      General: He is not in acute distress.    Appearance: He is well-developed. He is not diaphoretic.  Eyes:     Conjunctiva/sclera: Conjunctivae normal.     Pupils: Pupils are equal, round, and reactive to light.  Neck:     Musculoskeletal: Normal range of motion.  Cardiovascular:     Rate and Rhythm: Normal rate. Rhythm irregular.     Heart sounds: Normal heart sounds. No murmur.  Pulmonary:     Effort: Pulmonary effort is normal. No respiratory distress.     Breath sounds: Normal breath sounds. No wheezing or rales.  Abdominal:     General: Bowel sounds are normal. There is no distension.     Palpations: Abdomen is soft.     Tenderness: There is no abdominal tenderness. There is no guarding or rebound.  Musculoskeletal: Normal range of motion.        General: No tenderness.  Lymphadenopathy:     Cervical: No cervical adenopathy.  Skin:    General: Skin is warm and dry.  Neurological:     Mental Status: He is alert and oriented to person, place, and time.  Psychiatric:        Mood and Affect: Mood normal.        Behavior: Behavior normal.  Labs on Admission: I have personally reviewed following labs and imaging studies  CBC: Recent Labs  Lab 10/19/18 0245 10/19/18 2201 10/20/18 0228  WBC 7.2 9.7 8.3  NEUTROABS  --  6.8 6.0  HGB 10.3* 10.0* 9.6*  HCT 32.8* 32.0* 30.3*  MCV 105.8* 104.6* 103.4*  PLT 129* 129* 123*    Basic Metabolic Panel: Recent Labs  Lab 10/15/18 1407 10/19/18 0245 10/19/18 2201  NA 140 136 133*  K 3.9 3.7 3.7  CL 111 107 104  CO2 21* 24 20*  GLUCOSE 111* 160* 199*  BUN 25* 18 23  CREATININE 0.96  0.91 1.12  CALCIUM 8.7* 8.0* 8.1*    GFR: Estimated Creatinine Clearance: 62.1 mL/min (by C-G formula based on SCr of 1.12 mg/dL).  Liver Function Tests: No results for input(s): AST, ALT, ALKPHOS, BILITOT, PROT, ALBUMIN in the last 168 hours. No results for input(s): LIPASE, AMYLASE in the last 168 hours. No results for input(s): AMMONIA in the last 168 hours.  Coagulation Profile: No results for input(s): INR, PROTIME in the last 168 hours.  Cardiac Enzymes: No results for input(s): CKTOTAL, CKMB, CKMBINDEX, TROPONINI in the last 168 hours.  BNP (last 3 results) No results for input(s): PROBNP in the last 8760 hours.  HbA1C: No results for input(s): HGBA1C in the last 72 hours.  CBG: Recent Labs  Lab 10/15/18 1325 10/18/18 1009 10/18/18 1430 10/18/18 1745 10/19/18 1632  GLUCAP 113* 135* 107* 94 196*    Lipid Profile: No results for input(s): CHOL, HDL, LDLCALC, TRIG, CHOLHDL, LDLDIRECT in the last 72 hours.  Thyroid Function Tests: No results for input(s): TSH, T4TOTAL, FREET4, T3FREE, THYROIDAB in the last 72 hours.  Anemia Panel: No results for input(s): VITAMINB12, FOLATE, FERRITIN, TIBC, IRON, RETICCTPCT in the last 72 hours.  Urine analysis:    Component Value Date/Time   COLORURINE AMBER BIOCHEMICALS MAY BE AFFECTED BY COLOR (A) 07/14/2007 [place] 07/14/2007 0937   LABSPEC 1.029 07/14/2007 0937   PHURINE 6.0 07/14/2007 0937   GLUCOSEU NEGATIVE 07/14/2007 0937   HGBUR NEGATIVE 07/14/2007 0937   BILIRUBINUR SMALL (A) 07/14/2007 0937   KETONESUR NEGATIVE 07/14/2007 0937   PROTEINUR NEGATIVE 07/14/2007 0937   UROBILINOGEN 1.0 07/14/2007 0937   NITRITE NEGATIVE 07/14/2007 0937   LEUKOCYTESUR TRACE (A) 07/14/2007 0937     Radiological Exams on Admission: Dg Pelvis Portable  Result Date: 10/18/2018 CLINICAL DATA:  Post right hip arthroplasty. EXAM: PORTABLE PELVIS 1-2 VIEWS COMPARISON:  Preoperative evaluation 03/15/2018 and  intraoperative films 10/18/2018 FINDINGS: Post right hip arthroplasty without complicating features. Gas is seen in the soft tissues about the right hip compatible with postoperative change. Limited AP pelvis excludes the upper portion of the iliac crests. Distal aspect of the femoral component of the hip arthroplasty is included in the field of view. IMPRESSION: Uncomplicated appearance of right total hip arthroplasty. Electronically Signed   By: Zetta Bills M.D.   On: 10/18/2018 15:47   Dg C-arm 1-60 Min-no Report  Result Date: 10/18/2018 Fluoroscopy was utilized by the requesting physician.  No radiographic interpretation.   Dg Hip Operative Unilat W Or W/o Pelvis Right  Result Date: 10/18/2018 CLINICAL DATA:  Right total hip replacement EXAM: OPERATIVE RIGHT HIP (WITH PELVIS IF PERFORMED) 1 VIEWS TECHNIQUE: Fluoroscopic spot image(s) were submitted for interpretation post-operatively. COMPARISON:  None. FINDINGS: A single spot fluoroscopic image of the lower pelvis is provided for review. Provided image demonstrates the sequela of right total hip replacement though it is  incompletely imaged. Limited visualization of the pelvis demonstrates phleboliths overlying the lower pelvis bilaterally, left greater than right. IMPRESSION: Post right total hip replacement, incompletely imaged. Electronically Signed   By: Sandi Mariscal M.D.   On: 10/18/2018 14:26    Assessment/Plan Principal Problem:   Unilateral primary osteoarthritis, right hip Active Problems:   Status post total replacement of right hip  Hypotension Mild.  Likely secondary to antihypertensives.  Possibly a mild contribution from his anemia.  Blood pressure is only been intermittently low and not persistently low.  Patient given a 1 L IV fluid bolus today by primary service. -Hold IV fluids for now -Discontinue Lasix, losartan, spironolactone -Vitals every 4 hours; orthostatic vitals in the morning  Atrial fibrillation Rate  controlled.  Patient is on Xarelto and metoprolol as an outpatient. -Continue metoprolol and Xarelto (per primary)  Chronic diastolic heart failure Patient appears to be euvolemic on exam.  Patient is on spironolactone, losartan, metoprolol, Lasix as an outpatient.  Patient with grade 2 diastolic dysfunction from echocardiogram in 2019. -Continue to hold medications as mentioned above until blood pressure improved -Discharge recommendations pending clinical progression  Hyperlipidemia -Continue atorvastatin  Diabetes mellitus, type II Patient is on Jardiance as an outpatient -Can continue Jardiance  Ascending aortic aneurysm Last CT significant for diameter of 4.8 cm which is stable per CT surgery note in July   DVT prophylaxis: Xarelto (per primary) Code Status: Full code Family Communication: None at bedside Disposition Plan: Per primary   Cordelia Poche, MD [facility] 10/20/2018, 10:48 AM

## 2018-10-20 NOTE — Progress Notes (Signed)
Physical Therapy Treatment Patient Details Name: Carlos Dawson MRN: [id number] DOB: 1940-07-01 Today's Date: 10/20/2018    History of Present Illness 78 yo male s/p R THA-direct anterior 9/25. Hx of multiple back surgeries, DM, Af ib    PT Comments    Pt is progressing. Still have moderate burning pain with activity.    Follow Up Recommendations  Follow surgeon's recommendation for DC plan and follow-up therapies     Equipment Recommendations  Rolling walker with 5" wheels;3in1 (PT)    Recommendations for Other Services       Precautions / Restrictions Precautions Precautions: Fall Restrictions Weight Bearing Restrictions: No Other Position/Activity Restrictions: WBAT    Mobility  Bed Mobility Overal bed mobility: Needs Assistance Bed Mobility: Supine to Sit     Supine to sit: Min assist;HOB elevated Sit to supine: Min assist;HOB elevated   General bed mobility comments: Assist for LEs. Heavy reliance on bedrails. Wife stated they have rented a recliner for pt to sleep in until he can get in/out of bed better  Transfers Overall transfer level: Needs assistance Equipment used: Rolling walker (2 wheeled) Transfers: Sit to/from Stand Sit to Stand: Min guard;From elevated surface         General transfer comment: close guard for safety. increased time.  Ambulation/Gait Ambulation/Gait assistance: Min guard Gait Distance (Feet): 115 Feet Assistive device: Rolling walker (2 wheeled) Gait Pattern/deviations: Step-to pattern;Step-through pattern;Decreased stride length     General Gait Details: Slow, antalgic gait pattern that improved some as distance increased.   Stairs             Wheelchair Mobility    Modified Rankin (Stroke Patients Only)       Balance Overall balance assessment: Needs assistance         Standing balance support: Bilateral upper extremity supported Standing balance-Leahy Scale: Poor                              Cognition Arousal/Alertness: Awake/alert Behavior During Therapy: WFL for tasks assessed/performed Overall Cognitive Status: Within Functional Limits for tasks assessed                                        Exercises Total Joint Exercises Ankle Circles/Pumps: AROM;Both;10 reps;Seated Quad Sets: AROM;Both;10 reps;Seated Heel Slides: AAROM;Right;10 reps;Supine Hip ABduction/ADduction: AAROM;Right;10 reps;Seated Long Arc Quad: Right;10 reps;Seated    General Comments        Pertinent Vitals/Pain Pain Assessment: 0-10 Pain Score: 7  Pain Location: R hip/thigh Pain Descriptors / Indicators: Discomfort;Grimacing;Burning Pain Intervention(s): Monitored during session;Ice applied;Repositioned    Home Living                      Prior Function            PT Goals (current goals can now be found in the care plan section) Progress towards PT goals: Progressing toward goals    Frequency    7X/week      PT Plan Current plan remains appropriate    Co-evaluation              AM-PAC PT "6 Clicks" Mobility   Outcome Measure  Help needed turning from your back to your side while in a flat bed without using bedrails?: A Little Help needed moving from lying on your back to sitting  on the side of a flat bed without using bedrails?: A Little Help needed moving to and from a bed to a chair (including a wheelchair)?: A Little Help needed standing up from a chair using your arms (e.g., wheelchair or bedside chair)?: A Little Help needed to walk in hospital room?: A Little Help needed climbing 3-5 steps with a railing? : A Little 6 Click Score: 18    End of Session Equipment Utilized During Treatment: Gait belt Activity Tolerance: Patient limited by pain Patient left: in bed;with call bell/phone within reach;with bed alarm set;with family/visitor present   PT Visit Diagnosis: Unsteadiness on feet (R26.81);Pain;Other abnormalities of gait  and mobility (R26.89) Pain - Right/Left: Right Pain - part of body: Hip     Time: [id number] PT Time Calculation (min) (ACUTE ONLY): 18 min  Charges:  $Gait Training: 8-22 mins                        Weston Anna, [place] Pager: [phone] Office: [phone]

## 2018-10-20 NOTE — Progress Notes (Signed)
Physical Therapy Treatment Patient Details Name: Carlos Dawson MRN: [id number] DOB: 09-Nov-1940 Today's Date: 10/20/2018    History of Present Illness 78 yo male s/p R THA-direct anterior 9/25. Hx of multiple back surgeries, DM, Af ib    PT Comments    Pt had issues with low BP this morning-see vitals section. He was able to participate with PT. Will attempt to progress activity this afternoon.    Follow Up Recommendations  Follow surgeon's recommendation for DC plan and follow-up therapies     Equipment Recommendations  Rolling walker with 5" wheels;3in1 (PT)    Recommendations for Other Services       Precautions / Restrictions Precautions Precautions: Fall Restrictions Weight Bearing Restrictions: No Other Position/Activity Restrictions: WBAT    Mobility  Bed Mobility               General bed mobility comments: oob in recliner  Transfers Overall transfer level: Needs assistance Equipment used: Rolling walker (2 wheeled) Transfers: Sit to/from Stand Sit to Stand: Min guard         General transfer comment: close guard for safety. increased time.  Ambulation/Gait Ambulation/Gait assistance: Min guard Gait Distance (Feet): 35 Feet Assistive device: Rolling walker (2 wheeled) Gait Pattern/deviations: Step-to pattern;Step-through pattern;Decreased stride length     General Gait Details: Slow, antalgic gait pattern that improved some as distance increased. Vcs safety, technique, sequence. Shorter distance 2* low BP issues this am. Will attempt to walk further this afternoon   Stairs             Wheelchair Mobility    Modified Rankin (Stroke Patients Only)       Balance Overall balance assessment: Needs assistance         Standing balance support: Bilateral upper extremity supported Standing balance-Leahy Scale: Poor                              Cognition Arousal/Alertness: Awake/alert Behavior During Therapy: WFL for  tasks assessed/performed Overall Cognitive Status: Within Functional Limits for tasks assessed                                        Exercises Total Joint Exercises Ankle Circles/Pumps: AROM;Both;10 reps;Seated Quad Sets: AROM;Both;10 reps;Seated Heel Slides: AAROM;Right;10 reps;Supine Hip ABduction/ADduction: AAROM;Right;10 reps;Seated Long Arc Quad: Right;10 reps;Seated    General Comments        Pertinent Vitals/Pain Pain Assessment: 0-10 Pain Score: 7  Pain Location: R hip/thigh Pain Descriptors / Indicators: Discomfort;Grimacing;Guarding;Burning Pain Intervention(s): Limited activity within patient's tolerance;Monitored during session;Repositioned;Ice applied    Home Living                      Prior Function            PT Goals (current goals can now be found in the care plan section) Progress towards PT goals: Progressing toward goals    Frequency    7X/week      PT Plan Current plan remains appropriate    Co-evaluation              AM-PAC PT "6 Clicks" Mobility   Outcome Measure  Help needed turning from your back to your side while in a flat bed without using bedrails?: A Little Help needed moving from lying on your back to sitting on the  side of a flat bed without using bedrails?: A Little Help needed moving to and from a bed to a chair (including a wheelchair)?: A Little Help needed standing up from a chair using your arms (e.g., wheelchair or bedside chair)?: A Little Help needed to walk in hospital room?: A Little Help needed climbing 3-5 steps with a railing? : A Little 6 Click Score: 18    End of Session Equipment Utilized During Treatment: Gait belt Activity Tolerance: Patient limited by pain;Patient limited by fatigue Patient left: in chair;with call bell/phone within reach   PT Visit Diagnosis: Unsteadiness on feet (R26.81);Pain;Other abnormalities of gait and mobility (R26.89) Pain - Right/Left:  Right Pain - part of body: Hip     Time: [id number] PT Time Calculation (min) (ACUTE ONLY): 20 min  Charges:  $Gait Training: 8-22 mins                        Weston Anna, [place] Pager: [phone] Office: [phone]

## 2018-10-20 NOTE — Anesthesia Postprocedure Evaluation (Signed)
Anesthesia Post Note  Patient: DARRIC TULLAR  Procedure(s) Performed: RIGHT TOTAL HIP ARTHROPLASTY ANTERIOR APPROACH (Right Hip)     Patient location during evaluation: PACU Anesthesia Type: Spinal Level of consciousness: oriented and awake and alert Pain management: pain level controlled Vital Signs Assessment: post-procedure vital signs reviewed and stable Respiratory status: spontaneous breathing, respiratory function stable, nonlabored ventilation and patient connected to nasal cannula oxygen Cardiovascular status: blood pressure returned to baseline and stable Postop Assessment: no headache, no backache, no apparent nausea or vomiting and spinal receding Anesthetic complications: no    Last Vitals:  Vitals:   10/19/18 2057 10/20/18 0433  BP: 109/60 107/61  Pulse: 95 77  Resp: 18 16  Temp: 37.3 C 37.2 C  SpO2: 93% 93%    Last Pain:  Vitals:   10/20/18 0801  TempSrc:   PainSc: Asleep                 Lidia Collum

## 2018-10-20 NOTE — Progress Notes (Signed)
BP prior to administration of cozaar was 86/52. Same was repeated via manual and it was 90/70 @ 2040. Informed Dr Louanne Skye. Awaiting orders.

## 2018-10-21 ENCOUNTER — Encounter (HOSPITAL_COMMUNITY): Payer: Self-pay | Admitting: Orthopaedic Surgery

## 2018-10-21 DIAGNOSIS — M1611 Unilateral primary osteoarthritis, right hip: Principal | ICD-10-CM

## 2018-10-21 DIAGNOSIS — I480 Paroxysmal atrial fibrillation: Secondary | ICD-10-CM

## 2018-10-21 DIAGNOSIS — I25118 Atherosclerotic heart disease of native coronary artery with other forms of angina pectoris: Secondary | ICD-10-CM

## 2018-10-21 DIAGNOSIS — E861 Hypovolemia: Secondary | ICD-10-CM

## 2018-10-21 DIAGNOSIS — Z96641 Presence of right artificial hip joint: Secondary | ICD-10-CM

## 2018-10-21 DIAGNOSIS — I9589 Other hypotension: Secondary | ICD-10-CM

## 2018-10-21 LAB — COMPREHENSIVE METABOLIC PANEL
ALT: 13 U/L (ref 0–44)
AST: 13 U/L — ABNORMAL LOW (ref 15–41)
Albumin: 2.9 g/dL — ABNORMAL LOW (ref 3.5–5.0)
Alkaline Phosphatase: 57 U/L (ref 38–126)
Anion gap: 9 (ref 5–15)
BUN: 21 mg/dL (ref 8–23)
CO2: 20 mmol/L — ABNORMAL LOW (ref 22–32)
Calcium: 8.3 mg/dL — ABNORMAL LOW (ref 8.9–10.3)
Chloride: 108 mmol/L (ref 98–111)
Creatinine, Ser: 0.87 mg/dL (ref 0.61–1.24)
GFR calc Af Amer: >60 mL/min (ref >60)
GFR calc non Af Amer: >60 mL/min (ref >60)
Glucose, Bld: 143 mg/dL — ABNORMAL HIGH (ref 70–99)
Potassium: 4.1 mmol/L (ref 3.5–5.1)
Sodium: 137 mmol/L (ref 135–145)
Total Bilirubin: 1.4 mg/dL — ABNORMAL HIGH (ref 0.3–1.2)
Total Protein: 5.4 g/dL — ABNORMAL LOW (ref 6.5–8.1)

## 2018-10-21 LAB — CBC
HCT: 29.8 % — ABNORMAL LOW (ref 39.0–52.0)
Hemoglobin: 9.5 g/dL — ABNORMAL LOW (ref 13.0–17.0)
MCH: 32.9 pg (ref 26.0–34.0)
MCHC: 31.9 g/dL (ref 30.0–36.0)
MCV: 103.1 fL — ABNORMAL HIGH (ref 80.0–100.0)
Platelets: 125 10*3/uL — ABNORMAL LOW (ref 150–400)
RBC: 2.89 MIL/uL — ABNORMAL LOW (ref 4.22–5.81)
RDW: 14.2 % (ref 11.5–15.5)
WBC: 7.3 10*3/uL (ref 4.0–10.5)
nRBC: 0 % (ref 0.0–0.2)

## 2018-10-21 LAB — TROPONIN I (HIGH SENSITIVITY)
Troponin I (High Sensitivity): 9 ng/L (ref <18)
Troponin I (High Sensitivity): 8 ng/L (ref <18)

## 2018-10-21 MED ORDER — SODIUM CHLORIDE 0.9 % IV BOLUS
500.0000 mL | Freq: Once | INTRAVENOUS | Status: AC | PRN
  Administered 2018-10-21: 500 mL via INTRAVENOUS

## 2018-10-21 MED ORDER — CHLORHEXIDINE GLUCONATE CLOTH 2 % EX PADS
6.0000 | MEDICATED_PAD | Freq: Every day | CUTANEOUS | Status: DC
  Administered 2018-10-21: 6 via TOPICAL

## 2018-10-21 NOTE — Progress Notes (Signed)
Pt c/o chest pain and SOB. While assessing pt, went bradycardic. On call provider notified via amion and Rapid response RN called to assess pt.

## 2018-10-21 NOTE — Progress Notes (Signed)
Patient ID: Carlos Dawson, male   DOB: 21-Jul-1940, 78 y.o.   MRN: [id number] Patient was bradycardic this am and complaining of chest pain.  [facility] was contacted and [facility].  Being transferred to step-down to observe more closely.  Troponin ordered.  Patient awake and alert and was helped up and into a stretcher for transport.  Vitals stable currently.  Appreciate [facility] assisting in the care for this patient.

## 2018-10-21 NOTE — Consult Note (Addendum)
Cardiology Consultation:   Patient ID: Carlos Dawson; [id number]; 1940/06/18   Admit date: 10/18/2018 Date of Consult: 10/21/2018  Primary Care Provider: Josetta Huddle, MD Primary Cardiologist: Carlos Martinique, MD Primary Electrophysiologist:  None   Patient Profile:   Carlos Dawson is a 78 y.o. male with a PMH of CAD s/p BMS to OM2 in 2012, paroxysmal atrial fibrillation s/p ablation [id number], chronic diastolic CHF, ascending aortic aneurysm, HTN, HLD, and history of DVT, who presented for elective right hip replacement performed 10/18/2018, who is being seen today for the evaluation of chest pain and bradycardia at the request of Carlos Dawson.  History of Present Illness:   Carlos Dawson presented to the hospital 10/18/2018 for elective right hip replacement. His post-op course was complicated by hypotension and mild acute blood loss anemia. This morning, he reported chest pain which started around 1am. He reported chest pressure with associated SOB, palpitations, and diaphoresis which he states felt similar to symptoms he experienced prior to his ablation. His anginal equivalent is exertional dyspnea. He reports spontaneous resolution of chest pressure around 9am this morning. HsTrop was 9>8. EKG with sinus rhythm with 1st degree AV block with rate 86 bpm, run of PACs, non-specific T wave abnormalities, no STE/D. Hgb is down from 11.8 prior to surgery to 9.5 today, though stable from 9.6 yesterday. He was also reported to be bradycardic with HR in the 30s, though there is no evidence of this as he was not on telemetry at the time. It is unclear how the bradycardia was measured. Cardiology asked to evaluate for chest pain and bradycardia.   He was last evaluated by cardiology at an outpatient visit with Carlos Palau, NP (Afib clinic) via a telemedicine visit 06/2018, at which time he was doing well and without cardiac complaints. No medication changes occurred at this visit. His last ischemic  evaluation was a LHC 02/2017 for the evaluation of dyspnea which revealed patent OM2 stent, otherwise no significant obstructive CAD. His last echocardiogram 09/2017 showed EF 60-65%, G2DD, severely dilated LV with moderate focal basal hypertrophy, mild TR, and mild LAE. CTA chest/aorta 07/2018 showed stable ascending thoracic aortic aneurysm at 4.8cm. He is s/p ablation in 2019 for management of his atrial fibrillation. He has been on [name] in the past but discontinued due to cost of medication.     Past Medical History:  Diagnosis Date   Anticoagulant long-term use    Aortic root enlargement (HCC)    Ascending aortic aneurysm ([place])    recent scan in October 2012 showing no change; followed by Dr. Servando Snare   ASCVD (arteriosclerotic cardiovascular disease)    Prior BMS to the 2nd OM in September 2012; with repeat cath in October showing patency   CAD (coronary artery disease)    a. s/p BMS to 2nd OM in Sept 2012; b. LexiScan Myoview (12/2012):  Inf infarct; bowel and motion artifact make study difficult to interpret; no ischemia; not gated; Low Risk   CHF (congestive heart failure) (HCC)    no recent issues 10/13/14   Chronic back pain    "all over my back" (05/11/2017)   Colonic polyp    Contact lens/glasses fitting    Diastolic dysfunction    DVT (deep venous thrombosis) ([place])    ?LLE   Frequent headaches    "probably weekly" (05/11/2017)   Generalized headaches    neck stenosis   GERD (gastroesophageal reflux disease)    Hearing loss    Hearing loss  more so on left   Hemorrhoids    History of stomach ulcers    Hypertension    IBS (irritable bowel syndrome)    LVH (left ventricular hypertrophy)    Mild intermittent asthma    OA (osteoarthritis)    "all over" (05/11/2017)   Obesities, morbid (HCC)    OSA (obstructive sleep apnea)    PSG 03/30/97 AHI 21, BPAP 13/9   OSA on CPAP    PAF (paroxysmal atrial fibrillation) ([place])    a. on  Xarelto b. s/p DCCV in 08/2016; b. Tikosyn failed 04/16/17 with plans for Multaq and possible Afib ablation with Dr. Rayann Heman   Pneumonia    'several times" (05/11/2017)   Prostate CA [facility])    Oncologist  DR. Daralene Mil[facility] dx 09/24/14, undetermined tx   prostate; S/P "radiation and hormone injections"   Pulmonary embolism ([place]) 2008   "both lungs"   SOB (shortness of breath)    on excertion   Thoracic aortic aneurysm (HCC)    Aortic Size Index=     5.0    /Body surface area is 2.43 meters squared. = 2.05  < 2.75 cm/m2      4% risk per year 2.75 to 4.25          8% risk per year > 4.25 cm/m2    20% risk per year   Stable aneurysmal dilation of the ascending aorta with maximum AP diameter of 4.8 cm. Stable area of narrowing of the proximal most portion of the descending aorta measuring 2 cm., previously identified as an area of coarctation. No evidence of aortic dissection.  Coronary artery disease.  Normal appearance of the lungs.   Electronically Signed   By: Fidela Salisbury M.D.   On: 10/01/2014 08:50     Type II diabetes mellitus ([place])    metphormin, average 154 dx 2017    Past Surgical History:  Procedure Laterality Date   ACHILLES TENDON REPAIR Bilateral    AORTIC ARCH ANGIOGRAPHY N/A 03/13/2017   Procedure: AORTIC ARCH ANGIOGRAPHY;  Surgeon: Dawson, Carlos M, MD;  Location: [facility];  Service: Cardiovascular;  Laterality: N/A;   APPENDECTOMY     ATRIAL FIBRILLATION ABLATION  05/11/2017   ATRIAL FIBRILLATION ABLATION N/A 05/11/2017   Procedure: ATRIAL FIBRILLATION ABLATION;  Surgeon: [name], MD;  Location: [place] [facility];  Service: Cardiovascular;  Laterality: N/A;   BACK SURGERY     "I've had 7 back and 1 neck ORs" (05/11/2017)   BIOPSY  03/14/2018   Procedure: BIOPSY;  Surgeon: Ronnette Juniper, MD;  Location: [facility];  Service: Gastroenterology;;  EGD and Colon   CARDIAC CATHETERIZATION  2006   CARPAL TUNNEL RELEASE Bilateral    LEFT   CATARACT  EXTRACTION W/ INTRAOCULAR LENS  IMPLANT, BILATERAL Bilateral    CERVICAL SPINE SURGERY  06/02/2010   lower back and neck   COLONOSCOPY N/A 03/14/2018   Procedure: COLONOSCOPY;  Surgeon: Ronnette Juniper, MD;  Location: [facility];  Service: Gastroenterology;  Laterality: N/A;   COLONOSCOPY WITH PROPOFOL N/A 12/29/2014   Procedure: COLONOSCOPY WITH PROPOFOL;  Surgeon: Garlan Fair, MD;  Location: [facility];  Service: Endoscopy;  Laterality: N/A;   CORONARY ANGIOPLASTY WITH STENT PLACEMENT  October 2012   CORONARY STENT PLACEMENT  Sept 2012   2nd OM with BMS   ESOPHAGOGASTRODUODENOSCOPY N/A 03/14/2018   Procedure: ESOPHAGOGASTRODUODENOSCOPY (EGD);  Surgeon: Ronnette Juniper, MD;  Location: [facility];  Service: Gastroenterology;  Laterality: N/A;   HEMORROIDECTOMY  LAMINECTOMY  05/30/2012   L 4 L5   LAMINECTOMY WITH POSTERIOR LATERAL ARTHRODESIS LEVEL 3 N/A 10/18/2016   Procedure: Posterior Lateral Fusion - Lumbar One-Four, segmental instrumentation Lumbar One-Five,  decompression,;  Surgeon: Eustace Moore, MD;  Location: [facility];  Service: Neurosurgery;  Laterality: N/A;   LAPAROSCOPIC CHOLECYSTECTOMY     LAPAROSCOPIC GASTRIC BANDING     LEFT AND RIGHT HEART CATHETERIZATION WITH CORONARY ANGIOGRAM N/A 05/07/2014   Procedure: LEFT AND RIGHT HEART CATHETERIZATION WITH CORONARY ANGIOGRAM;  Surgeon: Carlos M Martinique, MD;  Location: [facility] C[facility];  Service: Cardiovascular;  Laterality: N/A;   LEFT HEART CATH AND CORONARY ANGIOGRAPHY N/A 03/13/2017   Procedure: LEFT HEART CATH AND CORONARY ANGIOGRAPHY;  Surgeon: Dawson, Carlos M, MD;  Location: [facility];  Service: Cardiovascular;  Laterality: N/A;   LUMBAR LAMINECTOMY/DECOMPRESSION MICRODISCECTOMY N/A 05/04/2016   Procedure: Laminectomy and Foraminotomy - Thoracic twelve-Lumbar one -Posterior Fusion Lumbar one-two;  Surgeon: Eustace Moore, MD;  Location: [facility];  Service: Neurosurgery;  Laterality: N/A;   POLYPECTOMY  03/14/2018    Procedure: POLYPECTOMY;  Surgeon: Ronnette Juniper, MD;  Location: [facility];  Service: Gastroenterology;;   POSTERIOR LUMBAR FUSION  10/18/2016   SHOULDER OPEN ROTATOR CUFF REPAIR Bilateral    TONSILLECTOMY AND ADENOIDECTOMY     TRIGGER FINGER RELEASE     LEFT   UVULOPALATOPHARYNGOPLASTY     VASECTOMY       Home Medications:  Prior to Admission medications   Medication Sig Start Date End Date Taking? Authorizing Provider  acetaminophen (TYLENOL) 500 MG tablet Take 1,000 mg by mouth every 8 (eight) hours as needed for mild pain or headache.    Yes [provider]  albuterol (PROVENTIL HFA;VENTOLIN HFA) 108 (90 Base) MCG/ACT inhaler Inhale 2 puffs into the lungs every 4 (four) hours as needed for wheezing or shortness of breath.   Yes [provider]  atorvastatin (LIPITOR) 10 MG tablet Take 5 mg by mouth daily at 6 PM.    Yes [provider]  augmented betamethasone dipropionate (DIPROLENE-AF) 0.05 % cream Apply 1 application topically 2 (two) times daily as needed for itching. 01/31/17  Yes [provider]  budesonide-formoterol (SYMBICORT) 80-4.5 MCG/ACT inhaler Inhale 2 puffs into the lungs 2 (two) times daily as needed (for shortness of breath).    Yes [provider]  Cholecalciferol (VITAMIN D-3) 5000 units TABS Take 5,000 Units by mouth daily.   Yes [provider]  Coenzyme Q10 100 MG capsule Take 100 mg by mouth 3 (three) times daily.   Yes [provider]  furosemide (LASIX) 80 MG tablet Take 40-80 mg by mouth See admin instructions. Take 80 mg in the morning and 40 mg in the evening    Yes [provider]  JARDIANCE 25 MG TABS tablet Take 1 tablet by mouth daily. 04/18/18  Yes [provider]  losartan (COZAAR) 25 MG tablet Take 1 tablet (25 mg total) by mouth daily. 09/03/18  Yes Dawson, Carlos M, MD  metoprolol tartrate (LOPRESSOR) 25 MG tablet Take 12.5 mg by mouth 2 (two) times daily.   Yes [provider]  montelukast (SINGULAIR) 10 MG tablet Take 10 mg by mouth at bedtime.   Yes [provider]  nitroGLYCERIN (NITROSTAT) 0.4 MG SL tablet Place 1 tablet (0.4 mg total) under the tongue every 5 (five) minutes as needed for chest pain. 09/15/16  Yes Carlota Raspberry, Tiffany, PA-C  omeprazole (PRILOSEC) 20 MG capsule Take 20 mg by  mouth daily.   Yes [provider]  Polyethyl Glycol-Propyl Glycol (SYSTANE OP) Place 1 drop into both eyes daily as needed (for dry eyes).    Yes [provider]  potassium chloride SA (KLOR-CON M20) 20 MEQ tablet Take 2 tablets (40 mEq total) by mouth 2 (two) times daily. Patient taking differently: Take 20-40 mEq by mouth See admin instructions. 2 tabs in the morning, 1 tab in the evening 04/26/18  Yes Dawson, Carlos M, MD  psyllium (METAMUCIL) 58.6 % packet Take 1 packet by mouth daily.   Yes [provider]  rivaroxaban (XARELTO) 20 MG TABS tablet Take 1 tablet (20 mg total) by mouth daily with supper. 03/14/17  Yes Dawson, Carlos M, MD  Skin Protectants, Misc. (EUCERIN) cream Apply 1 application topically as needed for dry skin.   Yes [provider]  spironolactone (ALDACTONE) 25 MG tablet Take 25 mg by mouth daily.   Yes [provider]  tamsulosin (FLOMAX) 0.4 MG CAPS Take 0.4 mg by mouth every evening.    Yes [provider]  topiramate (TOPAMAX) 25 MG capsule Take 50 mg by mouth 2 (two) times daily.    Yes [provider]  traMADol (ULTRAM) 50 MG tablet Take 50 mg by mouth every 12 (twelve) hours as needed for moderate pain.  09/22/14  Yes [provider]  celecoxib (CELEBREX) 200 MG capsule Take 200 mg by mouth 2 (two) times daily as needed for moderate pain. 04/19/17   [provider]  methocarbamol (ROBAXIN) 500 MG tablet Take 1 tablet (500 mg total) by mouth every 8 (eight) hours as needed for muscle spasms. 10/20/16   Eustace Moore, MD  sildenafil (VIAGRA) 100 MG tablet Take  100 mg by mouth as needed for erectile dysfunction.  05/23/10   [provider]    Inpatient Medications: Scheduled Meds:  atorvastatin  5 mg Oral q1800   canagliflozin  100 mg Oral QAC breakfast   Chlorhexidine Gluconate Cloth  6 each Topical Daily   cholecalciferol  5,000 Units Oral Daily   docusate sodium  100 mg Oral BID   ferrous gluconate  324 mg Oral BID WC   metoprolol tartrate  12.5 mg Oral BID   montelukast  10 mg Oral QHS   pantoprazole  40 mg Oral Daily   potassium chloride  40 mEq Oral Daily   And   potassium chloride  20 mEq Oral QHS   rivaroxaban  20 mg Oral Q supper   tamsulosin  0.4 mg Oral QPM   topiramate  50 mg Oral BID   Continuous Infusions:  sodium chloride Stopped (10/20/18 1259)   methocarbamol (ROBAXIN) IV 500 mg (10/18/18 1503)   PRN Meds: acetaminophen, albuterol, alum & mag hydroxide-simeth, diphenhydrAMINE, HYDROmorphone (DILAUDID) injection, menthol-cetylpyridinium **OR** phenol, methocarbamol **OR** methocarbamol (ROBAXIN) IV, metoCLOPramide **OR** metoCLOPramide (REGLAN) injection, mometasone-formoterol, nitroGLYCERIN, ondansetron **OR** ondansetron (ZOFRAN) IV, oxyCODONE, oxyCODONE, polyethylene glycol  Allergies:    Allergies  Allergen Reactions   Ace Inhibitors Cough   Amoxicillin-Pot Clavulanate     Other reaction(s): GI Upset (intolerance)   Quinolones     Patient was warned about not using Cipro and similar antibiotics. Recent studies have raised concern that fluoroquinolone antibiotics could be associated with an increased risk of aortic aneurysm Fluoroquinolones have non-antimicrobial properties that might jeopardise the integrity of the extracellular matrix of the vascular wall In a  propensity score matched cohort study in [place], there was a 66% increased rate of aortic aneurysm or  dissection associated with oral fluoroquinolone use, compared wit   Adhesive [Tape] Itching and Rash   Latex Itching, Rash  and Other (See Comments)    Bandaids   Morphine Itching    Social History:   Social History   Socioeconomic History   Marital status: Married    Spouse name: Not on file   Number of children: 3   Years of education: Not on file   Highest education level: Not on file  Occupational History   Occupation: Retired from Scientist, clinical (histocompatibility and immunogenetics): [place] resource strain: Not on file   Food insecurity    Worry: Not on file    Inability: Not on file   Transportation needs    Medical: Not on file    Non-medical: Not on file  Tobacco Use   Smoking status: Former Smoker    Packs/day: 1.50    Years: 30.00    Pack years: 45.00    Quit date: 01/24/1992    Years since quitting: 26.7   Smokeless tobacco: Never Used  Substance and Sexual Activity   Alcohol use: No   Drug use: No   Sexual activity: Not Currently  Lifestyle   Physical activity    Days per week: Not on file    Minutes per session: Not on file   Stress: Not on file  Relationships   Social connections    Talks on phone: Not on file    Gets together: Not on file    Attends religious service: Not on file    Active member of club or organization: Not on file    Attends meetings of clubs or organizations: Not on file    Relationship status: Not on file   Intimate partner violence    Fear of current or ex partner: Not on file    Emotionally abused: Not on file    Physically abused: Not on file    Forced sexual activity: Not on file  Other Topics Concern   Not on file  Social History Narrative   Not on file    Family History:    Family History  Problem Relation Age of Onset   Heart disease Mother    Diabetes Mother    Other Mother        stent placement   Emphysema Father 16   Heart attack Sister      ROS:  Please see the history of present illness.   All other ROS reviewed and negative.     Physical Exam/Data:   Vitals:   10/21/18 0454 10/21/18 0800  10/21/18 0900 10/21/18 1000  BP: (!) 110/54 (!) 113/47 (!) 90/50 (!) 116/50  Pulse: (!) 57 71 80 79  Resp: 16 16 16 19   Temp: 98.2 F (36.8 C)     TempSrc: Oral     SpO2: 97% 99% 95% 99%  Weight:      Height:        Intake/Output Summary (Last 24 hours) at 10/21/2018 1017 Last data filed at 10/21/2018 0454 Gross per 24 hour  Intake 1675.91 ml  Output 1210 ml  Net 465.91 ml   Filed Weights   10/18/18 1016 10/18/18 1758  Weight: 106.6 kg 106.6 kg   Body mass index is 39.11 kg/m.  General:  Well nourished, well developed, sitting upright in bed in no acute distress HEENT: sclera anicteric  Lymph: no adenopathy Neck: no JVD Endocrine:  No thryomegaly Vascular: No carotid bruits; distal  pulses 2+ bilaterally Cardiac:  normal S1, S2; IRIR; no murmur Lungs:  clear to auscultation bilaterally, no wheezing, rhonchi or rales  Abd: NABS, soft, nontender, no hepatomegaly Ext: no edema Musculoskeletal:  No deformities, BUE and BLE strength normal and equal Skin: warm and dry  Neuro:  CNs 2-12 intact, no focal abnormalities noted Psych:  Normal affect   EKG:  The EKG was personally reviewed and demonstrates:  sinus rhythm with 1st degree AV block with rate 86 bpm, run of PACs, non-specific T wave abnormalities, no STE/D. Telemetry:  Telemetry was personally reviewed and demonstrates:  Atrial fibrillation with rates up to 120s, overall in the 70s-100s. Also some breakthrough sinus rhythm with PACs.   Relevant CV Studies: Echocardiogram 09/2017: Study Conclusions  - Left ventricle: The cavity size was severely dilated. There was   moderate focal basal hypertrophy. Systolic function was normal.   The estimated ejection fraction was in the range of 60% to 65%.   Wall motion was normal; there were no regional wall motion   abnormalities. Features are consistent with a pseudonormal left   ventricular filling pattern, with concomitant abnormal relaxation   and increased filling pressure  (grade 2 diastolic dysfunction).   Doppler parameters are consistent with high ventricular filling   pressure. - Aortic valve: Valve area (VTI): 2.79 cm^2. Valve area (Vmax): 2.8   cm^2. Valve area (Vmean): 2.57 cm^2. - Mitral valve: Valve area by pressure half-time: 2.1 cm^2. Valve   area by continuity equation (using LVOT flow): 3.12 cm^2. - Left atrium: The atrium was mildly dilated. - Tricuspid valve: There was mild regurgitation. - Pulmonic valve: Peak gradient (S): 12 mm Hg.  Impressions:  - Normal LVF with mild BSH, grade 2 DD, mild LAE, mild TR.  Left heart catheterization 02/2017:  Previously placed Ost 2nd Mrg to 2nd Mrg stent (unknown type) is widely patent.  There is no aortic valve regurgitation.   1. No significant obstructive CAD 2. Aneurysmal thoracic aorta.   Plan: continue medical management . Consider pulmonary evaluation for symptoms of dyspnea.   Laboratory Data:  Chemistry Recent Labs  Lab 10/19/18 0245 10/19/18 2201 10/21/18 0726  NA 136 133* 137  K 3.7 3.7 4.1  CL 107 104 108  CO2 24 20* 20*  GLUCOSE 160* 199* 143*  BUN 18 23 21   CREATININE 0.91 1.12 0.87  CALCIUM 8.0* 8.1* 8.3*  GFRNONAA >60 >60 >60  GFRAA >60 >60 >60  ANIONGAP 5 9 9     Recent Labs  Lab 10/21/18 0726  PROT 5.4*  ALBUMIN 2.9*  AST 13*  ALT 13  ALKPHOS 57  BILITOT 1.4*   Hematology Recent Labs  Lab 10/19/18 2201 10/20/18 0228 10/21/18 0726  WBC 9.7 8.3 7.3  RBC 3.06* 2.93* 2.89*  HGB 10.0* 9.6* 9.5*  HCT 32.0* 30.3* 29.8*  MCV 104.6* 103.4* 103.1*  MCH 32.7 32.8 32.9  MCHC 31.3 31.7 31.9  RDW 14.4 14.3 14.2  PLT 129* 123* 125*   Cardiac EnzymesNo results for input(s): TROPONINI in the last 168 hours. No results for input(s): TROPIPOC in the last 168 hours.  BNPNo results for input(s): BNP, PROBNP in the last 168 hours.  DDimer No results for input(s): DDIMER in the last 168 hours.  Radiology/Studies:  Dg Pelvis Portable  Result Date:  10/18/2018 CLINICAL DATA:  Post right hip arthroplasty. EXAM: PORTABLE PELVIS 1-2 VIEWS COMPARISON:  Preoperative evaluation 03/15/2018 and intraoperative films 10/18/2018 FINDINGS: Post right hip arthroplasty without complicating features. Gas is seen  in the soft tissues about the right hip compatible with postoperative change. Limited AP pelvis excludes the upper portion of the iliac crests. Distal aspect of the femoral component of the hip arthroplasty is included in the field of view. IMPRESSION: Uncomplicated appearance of right total hip arthroplasty. Electronically Signed   By: Zetta Bills M.D.   On: 10/18/2018 15:47   Dg C-arm 1-60 Min-no Report  Result Date: 10/18/2018 Fluoroscopy was utilized by the requesting physician.  No radiographic interpretation.   Dg Hip Operative Unilat W Or W/o Pelvis Right  Result Date: 10/18/2018 CLINICAL DATA:  Right total hip replacement EXAM: OPERATIVE RIGHT HIP (WITH PELVIS IF PERFORMED) 1 VIEWS TECHNIQUE: Fluoroscopic spot image(s) were submitted for interpretation post-operatively. COMPARISON:  None. FINDINGS: A single spot fluoroscopic image of the lower pelvis is provided for review. Provided image demonstrates the sequela of right total hip replacement though it is incompletely imaged. Limited visualization of the pelvis demonstrates phleboliths overlying the lower pelvis bilaterally, left greater than right. IMPRESSION: Post right total hip replacement, incompletely imaged. Electronically Signed   By: Sandi Mariscal M.D.   On: 10/18/2018 14:26    Assessment and Plan:   1. Chest pain in patient with CAD s/p BMS to OM2 in 2012: patient reported chest pressure this morning which he reports is more in line with his arrythmia symptoms than prior angina. He is s/p right hip replacement this admission; post-op day 3. EKG and troponins this morning do not suggest ischemia. LHC in 2019 with patent stent and no other CAD noted. Not on aspirin given need for  anticoagulation - No ischemic evaluation at this time - will continue to monitor for recurrent symptoms - Continue statin and metoprolol.  2. ?Bradycardia in patient with paroxysmal atrial fibrillation: Patient is in atrial fibrillation on telemetry with rats up to 120s but primarily in the 70s-100s. He was not on telemetry at the time of reported bradycardia. No documented HR in the 30s. Suspect patient was in atrial fibrillation and HR not accurately depicted on dinamap or palpation of pulses. Likely atrial fibrillation is occurring in the setting of mild acute blood loss anemia and post-op status. Suspect this will improve as he recovers fro surgery. - Continue metoprolol for rate control - Continue xarelto for stroke ppx given CHA2DS2-VASc Score of 8 (CHF, HTN, DM type 2, Vascular, Age <75, and history of DVT) - Plan to follow-up in the Afib clinic for consideration of amiodarone or sotalol if Afib continues   3. Hypotension in patient with hypertension: intermittent episodes post-op. Overall not symptomatic. Home losartan, lasix, and spironolactone on hold - Continue metoprolol - Restart home medications once BP improves  4. Chronic diastolic CHF: home lasix, lisinopril, and spironolactone on hold given hypotension. He appears euvolemic on exam. - Continue to monitor volume status closely with IVF administration - Restart home medications once BP improves  5. Ascending aortic aneurysm: stable at 4.8cm on CTA 07/2018. Followed by Dr. Servando Snare outpatient. Felt to be a poor candidate for elective repair at this point.  - Continue routine outpatient monitoring  For questions or updates, please contact [place] Please consult [email or web address] for contact info under Cardiology/STEMI.   Signed, Abigail Butts, PA-C  10/21/2018 10:17 AM [phone]  I have seen and examined the patient along with Abigail Butts, PA-C .  I have reviewed the chart, notes and new data.  I agree with  PA/NP's note.  Key new complaints: feels well; had symptoms reminiscent of previous  angina last night, but transient and mild Key examination changes: very frequent ectopy on a background of NSR, otherwise normal CV exam Key new findings / data: carefully examined telemetry, there is no significant bradycardia, but the rhythm is often very irregular, which could explain a mechanical pulse deficit.Marland Kitchen   PLAN: No clear proof he ever had significant bradycardia - suspect artifactual recording using an automated BP cuff and pulse palpation during a highly irregular rhythm. He may have had angina due to hypotension, anemia and AF w RVR, but there is no evidence to support a true acute coronary atherothrombotic event.Will restart BP meds/diuretics gradually as his BP improves.  Sanda Klein, MD, [place] [phone] 10/21/2018, 1:15 PM

## 2018-10-21 NOTE — Progress Notes (Signed)
Subjective: 3 Days Post-Op Procedure(s) (LRB): RIGHT TOTAL HIP ARTHROPLASTY ANTERIOR APPROACH (Right) Patient reports pain as moderate right hip.  States his chest pain has resolved and denies any SOB.   Objective: Vital signs in last 24 hours: Temp:  [97.9 F (36.6 C)-99.2 F (37.3 C)] 97.9 F (36.6 C) (09/28 1143) Pulse Rate:  [57-87] 79 (09/28 1000) Resp:  [15-19] 19 (09/28 1000) BP: (90-116)/(47-74) 116/50 (09/28 1000) SpO2:  [94 %-99 %] 99 % (09/28 1000)  Intake/Output from previous day: 09/27 0701 - 09/28 0700 In: 1915.9 [P.O.:960; I.V.:540; IV Piggyback:415.9] Out: 1410 [Urine:1410] Intake/Output this shift: Total I/O In: -  Out: 225 [Urine:225]  Recent Labs    10/19/18 0245 10/19/18 2201 10/20/18 0228 10/21/18 0726  HGB 10.3* 10.0* 9.6* 9.5*   Recent Labs    10/20/18 0228 10/21/18 0726  WBC 8.3 7.3  RBC 2.93* 2.89*  HCT 30.3* 29.8*  PLT 123* 125*   Recent Labs    10/19/18 2201 10/21/18 0726  NA 133* 137  K 3.7 4.1  CL 104 108  CO2 20* 20*  BUN 23 21  CREATININE 1.12 0.87  GLUCOSE 199* 143*  CALCIUM 8.1* 8.3*   No results for input(s): LABPT, INR in the last 72 hours.  Right hip: Dorsiflexion/Plantar flexion intact Incision: scant drainage Compartment soft   Assessment/Plan: 3 Days Post-Op Procedure(s) (LRB): RIGHT TOTAL HIP ARTHROPLASTY ANTERIOR APPROACH (Right)  Right hip dressing changed. \  Chest pain and paroxysmal atrial fibrillation:On telemetry and cardiology following. Troponin I  And EKG not suggestive of ischemic event.   ABLA secondary to surgery stabilizing HgB 9.5     Carlos Dawson 10/21/2018, 12:23 PM

## 2018-10-21 NOTE — Progress Notes (Signed)
Physical Therapy Treatment Patient Details Name: Carlos Dawson MRN: [id number] DOB: 1940/11/05 Today's Date: 10/21/2018    History of Present Illness 78 yo male s/p R THA-direct anterior 9/25. Hx of multiple back surgeries, DM, Af ib    PT Comments    2nd attempt to work with pt. On both attempts, pt had just returned to bed after walking to/from bathroom. He reported fatigue and R LE spasms. He did agree to bed level exercises. Will continue to follow and progress activity as able.     Follow Up Recommendations  Follow surgeon's recommendation for DC plan and follow-up therapies     Equipment Recommendations  Rolling walker with 5" wheels;3in1 (PT)    Recommendations for Other Services       Precautions / Restrictions Precautions Precautions: Fall Restrictions Weight Bearing Restrictions: No Other Position/Activity Restrictions: WBAT    Mobility  Bed Mobility               General bed mobility comments: NT-pt just back to bed from walking to the bathroom.  Transfers                    Ambulation/Gait                 Stairs             Wheelchair Mobility    Modified Rankin (Stroke Patients Only)       Balance                                            Cognition Arousal/Alertness: Awake/alert Behavior During Therapy: WFL for tasks assessed/performed Overall Cognitive Status: Within Functional Limits for tasks assessed                                        Exercises Total Joint Exercises Ankle Circles/Pumps: AROM;Both;10 reps;Supine Quad Sets: AROM;Both;10 reps;Supine Heel Slides: AAROM;Right;10 reps;Supine Hip ABduction/ADduction: AAROM;Right;10 reps;Supine    General Comments        Pertinent Vitals/Pain Pain Assessment: 0-10 Pain Score: 6  Pain Location: R hip/thigh Pain Descriptors / Indicators: Discomfort;Grimacing;Burning;Spasm Pain Intervention(s): Monitored during  session;Ice applied    Home Living                      Prior Function            PT Goals (current goals can now be found in the care plan section) Progress towards PT goals: Progressing toward goals    Frequency    7X/week      PT Plan Current plan remains appropriate    Co-evaluation              AM-PAC PT "6 Clicks" Mobility   Outcome Measure  Help needed turning from your back to your side while in a flat bed without using bedrails?: A Little Help needed moving from lying on your back to sitting on the side of a flat bed without using bedrails?: A Little Help needed moving to and from a bed to a chair (including a wheelchair)?: A Little Help needed standing up from a chair using your arms (e.g., wheelchair or bedside chair)?: A Little Help needed to walk in hospital room?: A Little Help needed climbing 3-5  steps with a railing? : A Little 6 Click Score: 18    End of Session Equipment Utilized During Treatment: Gait belt Activity Tolerance: Patient limited by pain;Patient limited by fatigue Patient left: in bed;with call bell/phone within reach;with family/visitor present   PT Visit Diagnosis: Unsteadiness on feet (R26.81);Pain;Other abnormalities of gait and mobility (R26.89) Pain - Right/Left: Right Pain - part of body: Hip     Time: [id number] PT Time Calculation (min) (ACUTE ONLY): 13 min  Charges:  $Gait Training: 8-22 mins                        Weston Anna, [place] Pager: [phone] Office: [phone]

## 2018-10-21 NOTE — Progress Notes (Signed)
PROGRESS NOTE    Carlos Dawson  [id number] DOB: 1940-11-11 DOA: 10/18/2018 PCP: Josetta Huddle, MD   Brief Narrative:78 y.o. male with medical history significant of CAD, atrial fibrillation, diastolic heart failure, history of DVT, ascending aortic aneurysm.  Patient presented secondary to elective right hip replacement.  This was performed on 9/25.  Patient's postop period has been complicated by some mild hypotension with systolic blood pressures as low as 85 and diastolic blood pressures as low as 43.  Patient has been on IV fluids.  He has an associated acute blood loss anemia likely from recent surgery which is mild.  Patient is on multiple antihypertensive secondary to history of ascending aortic aneurysm in addition to diastolic heart failure.  He also has history of atrial fibrillation on Eliquis and beta-blocker.  Patient reports no symptoms related to his blood pressure.  He states that his doctors try to keep his blood pressure on the lower side secondary to his ascending aortic aneurysm.  10/21/2018 patient had a rapid response event this morning with hypotension chest pain and has chronic A. fib.  His chest pain was reproducible.  The pain got better on its own without any treatment.  Patient received fluid bolus of 500 cc prior to transfer to stepdown unit for telemetry.  Patient was not on telemetry prior to coming to the stepdown unit.  Overnight staff reported bradycardia  But none documented since he is moved to stepdown.  Assessment & Plan:   Principal Problem:   Unilateral primary osteoarthritis, right hip Active Problems:   Status post total replacement of right hip   #1 chronic atrial fibrillation/chronic diastolic heart failure-Lasix losartan and Aldactone are being on hold at this time.  Slow IV hydration 75 cc an hour for 1 L.  Cardiology following.  #2 hypotension better after starting IV fluids.  Lasix lisinopril and spironolactone on hold  #3 type 2 diabetes  continue Jardiance.  #4 history of ascending aortic aneurysm followed by CT surgery as an outpatient  #5 S/P right total hip arthroplasty three days ago..  #6 atypical chest pain negative troponin no acute EKG changes continue to monitor on telemetry.   Estimated body mass index is 39.11 kg/m as calculated from the following:   Height as of this encounter: 5\' 5"  (1.651 m).   Weight as of this encounter: 106.6 kg.  DVT prophylaxis: Xarelto per primary Code Status: Full code  family Communication: None at bedside Disposition Plan: Per primary Subjective: Patient complaining of left sided chest pain with shortness of breath which improved with placing oxygen the chest pain was reproducible.  Objective: Vitals:   10/21/18 0800 10/21/18 0900 10/21/18 1000 10/21/18 1143  BP: (!) 113/47 (!) 90/50 (!) 116/50   Pulse: 71 80 79   Resp: 16 16 19    Temp:    97.9 F (36.6 C)  TempSrc:    Oral  SpO2: 99% 95% 99%   Weight:      Height:        Intake/Output Summary (Last 24 hours) at 10/21/2018 1312 Last data filed at 10/21/2018 1051 Gross per 24 hour  Intake 1435.91 ml  Output 1085 ml  Net 350.91 ml   Filed Weights   10/18/18 1016 10/18/18 1758  Weight: 106.6 kg 106.6 kg    Examination:  General exam: Appears calm and comfortable  Respiratory system: Clear to auscultation. Respiratory effort normal. Cardiovascular system: S1 & S2 heard, RRR. No JVD, murmurs, rubs, gallops or clicks. No pedal edema.  Gastrointestinal system: Abdomen is nondistended, soft and nontender. No organomegaly or masses felt. Normal bowel sounds heard. Central nervous system: Alert and oriented. No focal neurological deficits. Extremities: Symmetric 5 x 5 power. Skin: No rashes, lesions or ulcers Psychiatry: Judgement and insight appear normal. Mood & affect appropriate.     Data Reviewed: I have personally reviewed following labs and imaging studies  CBC: Recent Labs  Lab 10/15/18 1407 10/19/18  0245 10/19/18 2201 10/20/18 0228 10/21/18 0726  WBC 7.3 7.2 9.7 8.3 7.3  NEUTROABS  --   --  6.8 6.0  --   HGB 11.8* 10.3* 10.0* 9.6* 9.5*  HCT 35.8* 32.8* 32.0* 30.3* 29.8*  MCV 105.3* 105.8* 104.6* 103.4* 103.1*  PLT 161 129* 129* 123* 0000000*   Basic Metabolic Panel: Recent Labs  Lab 10/15/18 1407 10/19/18 0245 10/19/18 2201 10/21/18 0726  NA 140 136 133* 137  K 3.9 3.7 3.7 4.1  CL 111 107 104 108  CO2 21* 24 20* 20*  GLUCOSE 111* 160* 199* 143*  BUN 25* 18 23 21   CREATININE 0.96 0.91 1.12 0.87  CALCIUM 8.7* 8.0* 8.1* 8.3*   GFR: Estimated Creatinine Clearance: 80 mL/min (by C-G formula based on SCr of 0.87 mg/dL). Liver Function Tests: Recent Labs  Lab 10/21/18 0726  AST 13*  ALT 13  ALKPHOS 57  BILITOT 1.4*  PROT 5.4*  ALBUMIN 2.9*   No results for input(s): LIPASE, AMYLASE in the last 168 hours. No results for input(s): AMMONIA in the last 168 hours. Coagulation Profile: No results for input(s): INR, PROTIME in the last 168 hours. Cardiac Enzymes: No results for input(s): CKTOTAL, CKMB, CKMBINDEX, TROPONINI in the last 168 hours. BNP (last 3 results) No results for input(s): PROBNP in the last 8760 hours. HbA1C: No results for input(s): HGBA1C in the last 72 hours. CBG: Recent Labs  Lab 10/15/18 1325 10/18/18 1009 10/18/18 1430 10/18/18 1745 10/19/18 1632  GLUCAP 113* 135* 107* 94 196*   Lipid Profile: No results for input(s): CHOL, HDL, LDLCALC, TRIG, CHOLHDL, LDLDIRECT in the last 72 hours. Thyroid Function Tests: No results for input(s): TSH, T4TOTAL, FREET4, T3FREE, THYROIDAB in the last 72 hours. Anemia Panel: No results for input(s): VITAMINB12, FOLATE, FERRITIN, TIBC, IRON, RETICCTPCT in the last 72 hours. Sepsis Labs: No results for input(s): PROCALCITON, LATICACIDVEN in the last 168 hours.  Recent Results (from the past 240 hour(s))  Novel Coronavirus, NAA (Hosp order, Send-out to Ref Lab; TAT 18-24 hrs     Status: None   Collection  Time: 10/15/18  2:53 PM   Specimen: Nasopharyngeal Swab; Respiratory  Result Value Ref Range Status   SARS-CoV-2, NAA NOT DETECTED NOT DETECTED Final    Comment: (NOTE) This nucleic acid amplification test was developed and its performance characteristics determined by [employer]. Nucleic acid amplification tests include PCR and TMA. This test has not been FDA cleared or approved. This test has been authorized by FDA under an Emergency Use Authorization (EUA). This test is only authorized for the duration of time the declaration that circumstances exist justifying the authorization of the emergency use of in vitro diagnostic tests for detection of SARS-CoV-2 virus and/or diagnosis of COVID-19 infection under section 564(b)(1) of the Act, 21 U.S.C. [id number]) (1), unless the authorization is terminated or revoked sooner. When diagnostic testing is negative, the possibility of a false negative result should be considered in the context of a patient's recent exposures and the presence of clinical signs and symptoms consistent with COVID-19. An individual  without symptoms of COVID- 19 and who is not shedding SARS-CoV-2 vi rus would expect to have a negative (not detected) result in this assay. Performed At: [facility] [address] [place], [place] [id number] Rush Farmer MD [id number]    [place]  Final    Comment: Performed at [facility], [place] [address] [place], [place] [address]  Surgical pcr screen     Status: None   Collection Time: 10/18/18 10:30 AM   Specimen: Nasal Mucosa; Nasal Swab  Result Value Ref Range Status   MRSA, PCR NEGATIVE NEGATIVE Final   Staphylococcus aureus NEGATIVE NEGATIVE Final    Comment: (NOTE) The Xpert SA Assay (FDA approved for NASAL specimens in patients 7 years of age and older), is one component of a comprehensive surveillance program. It is not intended to diagnose infection nor to  guide or monitor treatment. Performed at [facility], [place] [address], [place], [place] [address]          Radiology Studies: No results found.      Scheduled Meds: . atorvastatin  5 mg Oral q1800  . canagliflozin  100 mg Oral QAC breakfast  . Chlorhexidine Gluconate Cloth  6 each Topical Daily  . cholecalciferol  5,000 Units Oral Daily  . docusate sodium  100 mg Oral BID  . ferrous gluconate  324 mg Oral BID WC  . metoprolol tartrate  12.5 mg Oral BID  . montelukast  10 mg Oral QHS  . pantoprazole  40 mg Oral Daily  . potassium chloride  40 mEq Oral Daily   And  . potassium chloride  20 mEq Oral QHS  . rivaroxaban  20 mg Oral Q supper  . tamsulosin  0.4 mg Oral QPM  . topiramate  50 mg Oral BID   Continuous Infusions: . sodium chloride 100 mL/hr at 10/21/18 1024  . methocarbamol (ROBAXIN) IV 500 mg (10/18/18 1503)     LOS: 2 days      Georgette Shell, MD [facility]  If 7PM-7AM, please contact night-coverage [email or web address] Password [email or web address] 10/21/2018, 1:12 PM

## 2018-10-22 DIAGNOSIS — I959 Hypotension, unspecified: Secondary | ICD-10-CM

## 2018-10-22 DIAGNOSIS — R079 Chest pain, unspecified: Secondary | ICD-10-CM

## 2018-10-22 DIAGNOSIS — I10 Essential (primary) hypertension: Secondary | ICD-10-CM

## 2018-10-22 MED ORDER — FUROSEMIDE 40 MG PO TABS
40.0000 mg | ORAL_TABLET | Freq: Two times a day (BID) | ORAL | Status: DC
  Administered 2018-10-22 – 2018-10-23 (×2): 40 mg via ORAL
  Filled 2018-10-22 (×2): qty 1

## 2018-10-22 NOTE — Progress Notes (Addendum)
Progress Note  Patient Name: Carlos Dawson Date of Encounter: 10/22/2018  Primary Cardiologist: Peter Martinique, MD   Subjective   Working with PT. Seen after a walk in the hallway. No complaints of recurrent chest pain. Still with some palpitations. No SOB, dizziness, or lightheadedness. He is hopeful to be discharge home soon.   Inpatient Medications    Scheduled Meds: . atorvastatin  5 mg Oral q1800  . canagliflozin  100 mg Oral QAC breakfast  . Chlorhexidine Gluconate Cloth  6 each Topical Daily  . cholecalciferol  5,000 Units Oral Daily  . docusate sodium  100 mg Oral BID  . ferrous gluconate  324 mg Oral BID WC  . metoprolol tartrate  12.5 mg Oral BID  . montelukast  10 mg Oral QHS  . pantoprazole  40 mg Oral Daily  . potassium chloride  40 mEq Oral Daily   And  . potassium chloride  20 mEq Oral QHS  . rivaroxaban  20 mg Oral Q supper  . tamsulosin  0.4 mg Oral QPM  . topiramate  50 mg Oral BID   Continuous Infusions: . methocarbamol (ROBAXIN) IV 500 mg (10/18/18 1503)   PRN Meds: acetaminophen, albuterol, alum & mag hydroxide-simeth, diphenhydrAMINE, HYDROmorphone (DILAUDID) injection, menthol-cetylpyridinium **OR** phenol, methocarbamol **OR** methocarbamol (ROBAXIN) IV, metoCLOPramide **OR** metoCLOPramide (REGLAN) injection, mometasone-formoterol, nitroGLYCERIN, ondansetron **OR** ondansetron (ZOFRAN) IV, oxyCODONE, oxyCODONE, polyethylene glycol   Vital Signs    Vitals:   10/22/18 0200 10/22/18 0214 10/22/18 0504 10/22/18 0842  BP: (!) 122/48 (!) 132/59 117/77   Pulse: 78 (!) 53 (!) 59 (!) 104  Resp: 14 20 18    Temp:  98.4 F (36.9 C) 97.6 F (36.4 C)   TempSrc:  Oral Oral   SpO2: 96% 97% 100%   Weight:      Height:        Intake/Output Summary (Last 24 hours) at 10/22/2018 0858 Last data filed at 10/22/2018 0600 Gross per 24 hour  Intake 1180.6 ml  Output 675 ml  Net 505.6 ml   Filed Weights   10/18/18 1016 10/18/18 1758  Weight: 106.6 kg  106.6 kg    Telemetry    Atrial fibrillation with rates in the 60s-80s yesterday, up to 110s working with PT this morning - Personally Reviewed  ECG    No new tracings  Physical Exam   GEN: Sitting in bedside chair in no acute distress.   Neck: No JVD, no carotid bruits Cardiac: IRIR, no murmurs, rubs, or gallops.  Respiratory: Clear to auscultation bilaterally, no wheezes/ rales/ rhonchi GI: NABS, Soft, nontender, non-distended  MS: 1+ RLE edema; No deformity. Neuro:  Nonfocal, moving all extremities spontaneously Psych: Normal affect   Labs    Chemistry Recent Labs  Lab 10/19/18 0245 10/19/18 2201 10/21/18 0726  NA 136 133* 137  K 3.7 3.7 4.1  CL 107 104 108  CO2 24 20* 20*  GLUCOSE 160* 199* 143*  BUN 18 23 21   CREATININE 0.91 1.12 0.87  CALCIUM 8.0* 8.1* 8.3*  PROT  --   --  5.4*  ALBUMIN  --   --  2.9*  AST  --   --  13*  ALT  --   --  13  ALKPHOS  --   --  57  BILITOT  --   --  1.4*  GFRNONAA >60 >60 >60  GFRAA >60 >60 >60  ANIONGAP 5 9 9      Hematology Recent Labs  Lab 10/19/18 2201 10/20/18  0228 10/21/18 0726  WBC 9.7 8.3 7.3  RBC 3.06* 2.93* 2.89*  HGB 10.0* 9.6* 9.5*  HCT 32.0* 30.3* 29.8*  MCV 104.6* 103.4* 103.1*  MCH 32.7 32.8 32.9  MCHC 31.3 31.7 31.9  RDW 14.4 14.3 14.2  PLT 129* 123* 125*    Cardiac EnzymesNo results for input(s): TROPONINI in the last 168 hours. No results for input(s): TROPIPOC in the last 168 hours.   BNPNo results for input(s): BNP, PROBNP in the last 168 hours.   DDimer No results for input(s): DDIMER in the last 168 hours.   Radiology    No results found.  Cardiac Studies   Echocardiogram 09/2017: Study Conclusions  - Left ventricle: The cavity size was severely dilated. There was moderate focal basal hypertrophy. Systolic function was normal. The estimated ejection fraction was in the range of 60% to 65%. Wall motion was normal; there were no regional wall motion abnormalities.  Features are consistent with a pseudonormal left ventricular filling pattern, with concomitant abnormal relaxation and increased filling pressure (grade 2 diastolic dysfunction). Doppler parameters are consistent with high ventricular filling pressure. - Aortic valve: Valve area (VTI): 2.79 cm^2. Valve area (Vmax): 2.8 cm^2. Valve area (Vmean): 2.57 cm^2. - Mitral valve: Valve area by pressure half-time: 2.1 cm^2. Valve area by continuity equation (using LVOT flow): 3.12 cm^2. - Left atrium: The atrium was mildly dilated. - Tricuspid valve: There was mild regurgitation. - Pulmonic valve: Peak gradient (S): 12 mm Hg.  Impressions:  - Normal LVF with mild BSH, grade 2 DD, mild LAE, mild TR.  Left heart catheterization 02/2017:  Previously placed Ost 2nd Mrg to 2nd Mrg stent (unknown type) is widely patent.  There is no aortic valve regurgitation.  1. No significant obstructive CAD 2. Aneurysmal thoracic aorta.   Plan: continue medical management . Consider pulmonary evaluation for symptoms of dyspnea.    Patient Profile     78 y.o. male with a PMH of CAD s/p BMS to OM2 in 2012, paroxysmal atrial fibrillation s/p ablation [id number], chronic diastolic CHF, ascending aortic aneurysm, HTN, HLD, and history of DVT, who presented for elective right hip replacement performed 10/18/2018, who is being followed by cardiology for the evaluation of chest pain and ?bradycardia  Assessment & Plan    1. Chest pain in patient with CAD s/p BMS to OM2 in 2012: patient reported chest pressure 9/28 more similar to his arrythmia symptoms than prior angina. He is s/p right hip replacement this admission; post-op day 3. EKG and troponins this were not suggestive of ischemia. LHC in 2019 with patent stent and no other CAD noted. Not on aspirin given need for anticoagulation - No ischemic evaluation at this time - will continue to monitor for recurrent symptoms - Continue statin and  metoprolol.  2. ?Bradycardia in patient with paroxysmal atrial fibrillation: Patient is in atrial fibrillation on telemetry with rats up to 120s but primarily in the 70s-100s. He was not on telemetry at the time of reported bradycardia. No documented HR in the 30s. Suspect patient was in atrial fibrillation and HR not accurately depicted on dinamap or palpation of pulses. Likely atrial fibrillation is occurring in the setting of mild acute blood loss anemia and post-op status. Suspect this will improve as he recovers from surgery. - Continue metoprolol for rate control - Continue xarelto for stroke ppx given CHA2DS2-VASc Score of 8 (CHF, HTN, DM type 2, Vascular, Age <75, and history of DVT) - Plan to follow-up in the Afib clinic  for consideration of amiodarone or sotalol if Afib continues   3. Hypotension in patient with hypertension: intermittent episodes post-op; overall improved today. Home losartan, lasix, and spironolactone on hold - Will restart lasix today for management of CHF - Continue metoprolol - Anticipate restarting home medications at discharge  4. Chronic diastolic CHF: home lasix, lisinopril, and spironolactone on hold given hypotension. He has some LE edema on exam R>L.  - Will restart lasix today - Continue metoprolol - Anticipate restarting home medications at discharge  5. Ascending aortic aneurysm: stable at 4.8cm on CTA 07/2018. Followed by Dr. Servando Snare outpatient. Felt to be a poor candidate for elective repair at this point.  - Continue routine outpatient monitoring   Surgery team is anticipating discharge home today. Will arrange follow-up in the atrial fibrillation clinic for ongoing discussion of antiarrhythmic therapy.   For questions or updates, please contact [place] Please consult [email or web address] for contact info under Cardiology/STEMI.      Signed, Abigail Butts, PA-C  10/22/2018, 8:58 AM   [phone]  I have seen and examined the patient  along with Abigail Butts, PA-C .  I have reviewed the chart, notes and new data.  I agree with PA/NP's note.  Key new complaints: does not appear to be aware of atrial fibrillation when rate controlled. No current CV complaints.  Key examination changes: irregular rhythm, otherwise normal CV exam Key new findings / data: currently in rate controlled AFib (in 80s). No bradycardia detected on telemetry.  PLAN: Suspect symptoms the other night were related to AF w RVR in the setting of postop hyperadrenergic state.  Option for antiarrhythmic meds or redo ablation if clearly symptomatic, but it seems to me that he is usually rate controlled and asymptomatic (he showed me some recent random recordings from his smart phone ECG that clearly show AFib in the 60s, asymptomatic). Resume usual home meds.  Sanda Klein, MD, [place] [phone] 10/22/2018, 3:40 PM

## 2018-10-22 NOTE — Progress Notes (Signed)
PROGRESS NOTE    Carlos Dawson  [id number] DOB: 1940/09/02 DOA: 10/18/2018 PCP: Josetta Huddle, MD    Brief Narrative: 78 y.o. male with medical history significant of CAD, atrial fibrillation, diastolic heart failure, history of DVT, ascending aortic aneurysm.  Patient presented secondary to elective right hip replacement.  This was performed on 9/25.  Patient's postop period has been complicated by some mild hypotension with systolic blood pressures as low as 85 and diastolic blood pressures as low as 43.  Patient has been on IV fluids.  He has an associated acute blood loss anemia likely from recent surgery which is mild.  Patient is on multiple antihypertensive secondary to history of ascending aortic aneurysm in addition to diastolic heart failure.  He also has history of atrial fibrillation on Eliquis and beta-blocker.  Patient reports no symptoms related to his blood pressure.  He states that his doctors try to keep his blood pressure on the lower side secondary to his ascending aortic aneurysm.   Assessment & Plan:   Principal Problem:   Unilateral primary osteoarthritis, right hip Active Problems:   Status post total replacement of right hip  Hypotension-improved with holding Lasix losartan and spironolactone and with IV hydration.  Continue beta-blocker.  Restart antihypertensives slowly.  Atrial fibrillation Rate anywhere from 53-1 04. Patient is on Xarelto and metoprolol as an outpatient. -Continue metoprolol and Xarelto (per primary)  Chronic diastolic heart failure Patient appears to be euvolemic on exam.  Patient is on spironolactone, losartan, metoprolol, Lasix as an outpatient.  Patient with grade 2 diastolic dysfunction from echocardiogram in 2019. -Continue to hold medications as mentioned above until blood pressure improved -Discharge recommendations pending clinical progression  Hyperlipidemia -Continue atorvastatin  Diabetes mellitus, type II Patient  is on Jardiance as an outpatient -Can continue Jardiance  Ascending aortic aneurysm Last CT significant for diameter of 4.8 cm which is stable per CT surgery note in July  Status post right hip replacement per Ortho  Estimated body mass index is 39.11 kg/m as calculated from the following:   Height as of this encounter: 5\' 5"  (1.651 m).   Weight as of this encounter: 106.6 kg.  DVT prophylaxis: xarelto Code Status: full Family Communication: none Disposition Plan:per primary   Subjective: Ambulating to rest room.no further chest pain or shortness of breath  Objective: Vitals:   10/22/18 0200 10/22/18 0214 10/22/18 0504 10/22/18 0842  BP: (!) 122/48 (!) 132/59 117/77   Pulse: 78 (!) 53 (!) 59 (!) 104  Resp: 14 20 18    Temp:  98.4 F (36.9 C) 97.6 F (36.4 C)   TempSrc:  Oral Oral   SpO2: 96% 97% 100%   Weight:      Height:        Intake/Output Summary (Last 24 hours) at 10/22/2018 1115 Last data filed at 10/22/2018 0859 Gross per 24 hour  Intake 1420.6 ml  Output 450 ml  Net 970.6 ml   Filed Weights   10/18/18 1016 10/18/18 1758  Weight: 106.6 kg 106.6 kg    Examination:  General exam: Appears calm and comfortable  Respiratory system: Clear to auscultation. Respiratory effort normal. Cardiovascular system: S1 & S2 heard, RRR. No JVD, murmurs, rubs, gallops or clicks. No pedal edema. Gastrointestinal system: Abdomen is nondistended, soft and nontender. No organomegaly or masses felt. Normal bowel sounds heard. Central nervous system: Alert and oriented. No focal neurological deficits. Extremities: Symmetric 5 x 5 power. Skin: No rashes, lesions or ulcers Psychiatry: Judgement and insight  appear normal. Mood & affect appropriate.     Data Reviewed: I have personally reviewed following labs and imaging studies  CBC: Recent Labs  Lab 10/15/18 1407 10/19/18 0245 10/19/18 2201 10/20/18 0228 10/21/18 0726  WBC 7.3 7.2 9.7 8.3 7.3  NEUTROABS  --   --   6.8 6.0  --   HGB 11.8* 10.3* 10.0* 9.6* 9.5*  HCT 35.8* 32.8* 32.0* 30.3* 29.8*  MCV 105.3* 105.8* 104.6* 103.4* 103.1*  PLT 161 129* 129* 123* 0000000*   Basic Metabolic Panel: Recent Labs  Lab 10/15/18 1407 10/19/18 0245 10/19/18 2201 10/21/18 0726  NA 140 136 133* 137  K 3.9 3.7 3.7 4.1  CL 111 107 104 108  CO2 21* 24 20* 20*  GLUCOSE 111* 160* 199* 143*  BUN 25* 18 23 21   CREATININE 0.96 0.91 1.12 0.87  CALCIUM 8.7* 8.0* 8.1* 8.3*   GFR: Estimated Creatinine Clearance: 80 mL/min (by C-G formula based on SCr of 0.87 mg/dL). Liver Function Tests: Recent Labs  Lab 10/21/18 0726  AST 13*  ALT 13  ALKPHOS 57  BILITOT 1.4*  PROT 5.4*  ALBUMIN 2.9*   No results for input(s): LIPASE, AMYLASE in the last 168 hours. No results for input(s): AMMONIA in the last 168 hours. Coagulation Profile: No results for input(s): INR, PROTIME in the last 168 hours. Cardiac Enzymes: No results for input(s): CKTOTAL, CKMB, CKMBINDEX, TROPONINI in the last 168 hours. BNP (last 3 results) No results for input(s): PROBNP in the last 8760 hours. HbA1C: No results for input(s): HGBA1C in the last 72 hours. CBG: Recent Labs  Lab 10/15/18 1325 10/18/18 1009 10/18/18 1430 10/18/18 1745 10/19/18 1632  GLUCAP 113* 135* 107* 94 196*   Lipid Profile: No results for input(s): CHOL, HDL, LDLCALC, TRIG, CHOLHDL, LDLDIRECT in the last 72 hours. Thyroid Function Tests: No results for input(s): TSH, T4TOTAL, FREET4, T3FREE, THYROIDAB in the last 72 hours. Anemia Panel: No results for input(s): VITAMINB12, FOLATE, FERRITIN, TIBC, IRON, RETICCTPCT in the last 72 hours. Sepsis Labs: No results for input(s): PROCALCITON, LATICACIDVEN in the last 168 hours.  Recent Results (from the past 240 hour(s))  Novel Coronavirus, NAA (Hosp order, Send-out to Ref Lab; TAT 18-24 hrs     Status: None   Collection Time: 10/15/18  2:53 PM   Specimen: Nasopharyngeal Swab; Respiratory  Result Value Ref Range  Status   SARS-CoV-2, NAA NOT DETECTED NOT DETECTED Final    Comment: (NOTE) This nucleic acid amplification test was developed and its performance characteristics determined by [employer]. Nucleic acid amplification tests include PCR and TMA. This test has not been FDA cleared or approved. This test has been authorized by FDA under an Emergency Use Authorization (EUA). This test is only authorized for the duration of time the declaration that circumstances exist justifying the authorization of the emergency use of in vitro diagnostic tests for detection of SARS-CoV-2 virus and/or diagnosis of COVID-19 infection under section 564(b)(1) of the Act, 21 U.S.C. [id number]) (1), unless the authorization is terminated or revoked sooner. When diagnostic testing is negative, the possibility of a false negative result should be considered in the context of a patient's recent exposures and the presence of clinical signs and symptoms consistent with COVID-19. An individual without symptoms of COVID- 19 and who is not shedding SARS-CoV-2 vi rus would expect to have a negative (not detected) result in this assay. Performed At: [facility] [place], [place] [id number] Rush Farmer MD [id number]  Coronavirus Source NASOPHARYNGEAL  Final    Comment: Performed at [facility], [place] [address], [place], [place] [address]  Surgical pcr screen     Status: None   Collection Time: 10/18/18 10:30 AM   Specimen: Nasal Mucosa; Nasal Swab  Result Value Ref Range Status   MRSA, PCR NEGATIVE NEGATIVE Final   Staphylococcus aureus NEGATIVE NEGATIVE Final    Comment: (NOTE) The Xpert SA Assay (FDA approved for NASAL specimens in patients 82 years of age and older), is one component of a comprehensive surveillance program. It is not intended to diagnose infection nor to guide or monitor treatment. Performed at [facility], [place] [address]  [place], [place] [address]          Radiology Studies: No results found.      Scheduled Meds: . atorvastatin  5 mg Oral q1800  . canagliflozin  100 mg Oral QAC breakfast  . Chlorhexidine Gluconate Cloth  6 each Topical Daily  . cholecalciferol  5,000 Units Oral Daily  . docusate sodium  100 mg Oral BID  . ferrous gluconate  324 mg Oral BID WC  . furosemide  40 mg Oral BID  . metoprolol tartrate  12.5 mg Oral BID  . montelukast  10 mg Oral QHS  . pantoprazole  40 mg Oral Daily  . potassium chloride  40 mEq Oral Daily   And  . potassium chloride  20 mEq Oral QHS  . rivaroxaban  20 mg Oral Q supper  . tamsulosin  0.4 mg Oral QPM  . topiramate  50 mg Oral BID   Continuous Infusions: . methocarbamol (ROBAXIN) IV 500 mg (10/18/18 1503)     LOS: 3 days     Georgette Shell, MD [facility]  If 7PM-7AM, please contact night-coverage [email or web address] Password [email or web address] 10/22/2018, 11:15 AM

## 2018-10-22 NOTE — Progress Notes (Signed)
Physical Therapy Treatment Patient Details Name: Carlos Dawson MRN: [id number] DOB: 07-25-1940 Today's Date: 10/22/2018    History of Present Illness 78 yo male s/p R THA-direct anterior 9/25. Hx of multiple back surgeries, DM, Af ib    PT Comments    Pt continues to participate well. Still experiencing burning along front of thigh and groin area. Plan is for d/c home on tomorrow with HHPT.    Follow Up Recommendations  Follow surgeon's recommendation for DC plan and follow-up therapies     Equipment Recommendations  Rolling walker with 5" wheels;3in1 (PT)    Recommendations for Other Services       Precautions / Restrictions Precautions Precautions: Fall Restrictions Weight Bearing Restrictions: No Other Position/Activity Restrictions: WBAT    Mobility  Bed Mobility Overal bed mobility: Needs Assistance Bed Mobility: Supine to Sit;Sit to Supine     Supine to sit: Min assist;HOB elevated Sit to supine: Min assist;HOB elevated   General bed mobility comments: Assist for R LE  Transfers Overall transfer level: Needs assistance Equipment used: Rolling walker (2 wheeled) Transfers: Sit to/from Stand Sit to Stand: Min guard         General transfer comment: close guard for safety. increased time.  Ambulation/Gait Ambulation/Gait assistance: Min guard Gait Distance (Feet): 200 Feet Assistive device: Rolling walker (2 wheeled) Gait Pattern/deviations: Step-to pattern;Step-through pattern;Decreased stride length     General Gait Details: for safety. slow gait speed.   Stairs             Wheelchair Mobility    Modified Rankin (Stroke Patients Only)       Balance Overall balance assessment: Needs assistance         Standing balance support: Bilateral upper extremity supported Standing balance-Leahy Scale: Poor                              Cognition Arousal/Alertness: Awake/alert Behavior During Therapy: WFL for tasks  assessed/performed Overall Cognitive Status: Within Functional Limits for tasks assessed                                        Exercises Total Joint Exercises Ankle Circles/Pumps: AROM;Both;10 reps;Seated Quad Sets: AROM;Both;10 reps;Seated Long Arc Quad: AROM;Right;10 reps;Seated    General Comments        Pertinent Vitals/Pain Pain Assessment: 0-10 Pain Score: 6  Pain Location: R hip/thigh Pain Descriptors / Indicators: Discomfort;Grimacing;Burning Pain Intervention(s): Monitored during session;Ice applied    Home Living                      Prior Function            PT Goals (current goals can now be found in the care plan section) Progress towards PT goals: Progressing toward goals    Frequency    7X/week      PT Plan Current plan remains appropriate    Co-evaluation              AM-PAC PT "6 Clicks" Mobility   Outcome Measure  Help needed turning from your back to your side while in a flat bed without using bedrails?: A Little Help needed moving from lying on your back to sitting on the side of a flat bed without using bedrails?: A Little Help needed moving to and from a bed to  a chair (including a wheelchair)?: A Little Help needed standing up from a chair using your arms (e.g., wheelchair or bedside chair)?: A Little Help needed to walk in hospital room?: A Little Help needed climbing 3-5 steps with a railing? : A Little 6 Click Score: 18    End of Session Equipment Utilized During Treatment: Gait belt Activity Tolerance: Patient tolerated treatment well Patient left: in bed;with call bell/phone within reach;with family/visitor present   PT Visit Diagnosis: Unsteadiness on feet (R26.81);Pain;Other abnormalities of gait and mobility (R26.89) Pain - Right/Left: Right Pain - part of body: Hip     Time: 1355-1418 PT Time Calculation (min) (ACUTE ONLY): 23 min  Charges:  $Gait Training: 23-37 mins                         Weston Anna, PT Acute Rehabilitation Services Pager: [phone] Office: [phone]

## 2018-10-22 NOTE — Progress Notes (Signed)
Physical Therapy Treatment Patient Details Name: Carlos Dawson MRN: [id number] DOB: 01/28/1940 Today's Date: 10/22/2018    History of Present Illness 78 yo male s/p R THA-direct anterior 9/25. Hx of multiple back surgeries, DM, Af ib    PT Comments    Progressing with mobility.   Follow Up Recommendations  Follow surgeon's recommendation for DC plan and follow-up therapies     Equipment Recommendations  Rolling walker with 5" wheels;3in1 (PT)    Recommendations for Other Services       Precautions / Restrictions Precautions Precautions: Fall Restrictions Weight Bearing Restrictions: No Other Position/Activity Restrictions: WBAT    Mobility  Bed Mobility               General bed mobility comments: oob in recliner  Transfers Overall transfer level: Needs assistance Equipment used: Rolling walker (2 wheeled) Transfers: Sit to/from Stand Sit to Stand: Min guard         General transfer comment: close guard for safety. increased time.  Ambulation/Gait Ambulation/Gait assistance: Min guard Gait Distance (Feet): 100 Feet Assistive device: Rolling walker (2 wheeled) Gait Pattern/deviations: Step-to pattern;Step-through pattern;Decreased stride length     General Gait Details: for safety. slow gait speed.   Stairs             Wheelchair Mobility    Modified Rankin (Stroke Patients Only)       Balance Overall balance assessment: Needs assistance         Standing balance support: Bilateral upper extremity supported Standing balance-Leahy Scale: Poor                              Cognition Arousal/Alertness: Awake/alert Behavior During Therapy: WFL for tasks assessed/performed Overall Cognitive Status: Within Functional Limits for tasks assessed                                        Exercises Total Joint Exercises Ankle Circles/Pumps: AROM;Both;10 reps;Seated Quad Sets: AROM;Both;10 reps;Seated Long  Arc Quad: AROM;Right;10 reps;Seated    General Comments        Pertinent Vitals/Pain Pain Assessment: 0-10 Pain Score: 6  Pain Location: R hip/thigh Pain Descriptors / Indicators: Discomfort;Grimacing;Burning;Spasm Pain Intervention(s): Monitored during session;Ice applied    Home Living                      Prior Function            PT Goals (current goals can now be found in the care plan section) Progress towards PT goals: Progressing toward goals    Frequency    7X/week      PT Plan Current plan remains appropriate    Co-evaluation              AM-PAC PT "6 Clicks" Mobility   Outcome Measure  Help needed turning from your back to your side while in a flat bed without using bedrails?: A Little Help needed moving from lying on your back to sitting on the side of a flat bed without using bedrails?: A Little Help needed moving to and from a bed to a chair (including a wheelchair)?: A Little Help needed standing up from a chair using your arms (e.g., wheelchair or bedside chair)?: A Little Help needed to walk in hospital room?: A Little Help needed climbing 3-5 steps with  a railing? : A Little 6 Click Score: 18    End of Session Equipment Utilized During Treatment: Gait belt Activity Tolerance: Patient tolerated treatment well Patient left: in chair;with call bell/phone within reach;with family/visitor present   PT Visit Diagnosis: Unsteadiness on feet (R26.81);Pain;Other abnormalities of gait and mobility (R26.89) Pain - Right/Left: Right Pain - part of body: Hip     Time: 1040-1053 PT Time Calculation (min) (ACUTE ONLY): 13 min  Charges:  $Gait Training: 8-22 mins                        Weston Anna, [place] Pager: [phone] Office: [phone]

## 2018-10-22 NOTE — Progress Notes (Signed)
Patient ID: Carlos Dawson, male   DOB: 1940/03/04, 78 y.o.   MRN: [id number] Awake and alert this am.  Vitals stable overnight and this am.  Denies chest pain or SOB.  Appreciate Cardiology and [facility] consults on this patient given his episode of bradycardia and chest pain yesterday.  Back to a regular floor bed.  Will mobilize again with PT today and can hopefully discharge to home tomorrow.

## 2018-10-23 MED ORDER — OXYCODONE HCL 5 MG PO TABS: 5.0000 mg | ORAL_TABLET | ORAL | 0 refills | Status: DC | PRN

## 2018-10-23 MED ORDER — METHOCARBAMOL 500 MG PO TABS: 500.0000 mg | ORAL_TABLET | Freq: Four times a day (QID) | ORAL | 0 refills | Status: DC | PRN

## 2018-10-23 NOTE — Discharge Summary (Signed)
Patient ID: Carlos Dawson MRN: [id number] DOB/AGE: 77-Mar-1942 77 y.o.  Admit date: 10/18/2018 Discharge date: 10/23/2018  Admission Diagnoses:  Principal Problem:   Unilateral primary osteoarthritis, right hip Active Problems:   Status post total replacement of right hip   Discharge Diagnoses:  Same  Past Medical History:  Diagnosis Date  . Anticoagulant long-term use   . Aortic root enlargement ([place])   . Ascending aortic aneurysm [facility])    recent scan in October 2012 showing no change; followed by Carlos Dawson  . ASCVD (arteriosclerotic cardiovascular disease)    Prior BMS to the 2nd OM in September 2012; with repeat cath in October showing patency  . CAD (coronary artery disease)    a. s/p BMS to 2nd OM in Sept 2012; b. LexiScan Myoview (12/2012):  Inf infarct; bowel and motion artifact make study difficult to interpret; no ischemia; not gated; Low Risk  . CHF (congestive heart failure) ([place])    no recent issues 10/13/14  . Chronic back pain    "all over my back" (05/11/2017)  . Colonic polyp   . Contact lens/glasses fitting   . Diastolic dysfunction   . DVT (deep venous thrombosis) ([place])    ?LLE  . Frequent headaches    "probably weekly" (05/11/2017)  . Generalized headaches    neck stenosis  . GERD (gastroesophageal reflux disease)   . Hearing loss   . Hearing loss    more so on left  . Hemorrhoids   . History of stomach ulcers   . Hypertension   . IBS (irritable bowel syndrome)   . LVH (left ventricular hypertrophy)   . Mild intermittent asthma   . OA (osteoarthritis)    "all over" (05/11/2017)  . Obesities, morbid ([place])   . OSA (obstructive sleep apnea)    PSG 03/30/97 AHI 21, BPAP 13/9  . OSA on CPAP   . PAF (paroxysmal atrial fibrillation) ([place])    a. on Xarelto b. s/p DCCV in 08/2016; b. Tikosyn failed 04/16/17 with plans for Multaq and possible Afib ablation with Dr. Rayann Heman  . Pneumonia    'several times" (05/11/2017)  . Prostate CA [facility])     Oncologist  Carlos [facility] dx 09/24/14, undetermined tx   prostate; S/P "radiation and hormone injections"  . Pulmonary embolism ([place]) 2008   "both lungs"  . SOB (shortness of breath)    on excertion  . Thoracic aortic aneurysm ([facility])    Aortic Size Index=     5.0    /Body surface area is 2.43 meters squared. = 2.05  < 2.75 cm/m2      4% risk per year 2.75 to 4.25          8% risk per year > 4.25 cm/m2    20% risk per year   Stable aneurysmal dilation of the ascending aorta with maximum AP diameter of 4.8 cm. Stable area of narrowing of the proximal most portion of the descending aorta measuring 2 cm., previously identified as an area of coarctation. No evidence of aortic dissection.  Coronary artery disease.  Normal appearance of the lungs.   Electronically Signed   By: Carlos Salisbury M.D.   On: 10/01/2014 08:50    . Type II diabetes mellitus (HCC)    metphormin, average 154 dx 2017    Surgeries: Procedure(s): RIGHT TOTAL HIP ARTHROPLASTY ANTERIOR APPROACH on 10/18/2018   Consultants:   Discharged Condition: Improved  Hospital Course: Carlos Dawson is an 78 y.o. male who was  admitted 10/18/2018 for operative treatment ofUnilateral primary osteoarthritis, right hip. Patient has severe unremitting pain that affects sleep, daily activities, and work/hobbies. After pre-op clearance the patient was taken to the operating room on 10/18/2018 and underwent  Procedure(s): RIGHT TOTAL HIP ARTHROPLASTY ANTERIOR APPROACH.    Patient was given perioperative antibiotics:  Anti-infectives (From admission, onward)   Start     Dose/Rate Route Frequency Ordered Stop   10/18/18 2000  clindamycin (CLEOCIN) IVPB 600 mg     600 mg 100 mL/hr over 30 Minutes Intravenous Every 6 hours 10/18/18 1806 10/19/18 0300   10/18/18 1000  clindamycin (CLEOCIN) IVPB 900 mg     900 mg 100 mL/hr over 30 Minutes Intravenous On call to O.R. 10/18/18 [id number] 10/18/18 1305       Patient was given sequential compression  devices, early ambulation, and chemoprophylaxis to prevent DVT.  Patient benefited maximally from hospital stay.  On postoperative day 3, he did have an episode of bradycardia early in the morning and some light chest pain.  He was seen by [facility] as well as cardiology and was transferred to the stepdown unit.  He had a negative work-up for ischemia.  He does have a history of atrial fibrillation and is on Xarelto.  He stabilizes easily and had no other events during his hospitalization.  By the day of discharge he was deemed stable medically for discharge to home.  Recent vital signs:  Patient Vitals for the past 24 hrs:  BP Temp Temp src Pulse Resp SpO2  10/23/18 0448 123/79 98.4 F (36.9 C) Oral (!) 58 13 100 %  10/23/18 0031 91/67 98 F (36.7 C) Oral (!) 58 14 94 %  10/22/18 2108 (!) 142/66 98.4 F (36.9 C) Oral 80 (!) 24 100 %  10/22/18 1434 - - - - - 93 %  10/22/18 1340 106/78 98.9 F (37.2 C) Oral 76 16 -  10/22/18 0842 - - - (!) 104 - -     Recent laboratory studies:  Recent Labs    10/21/18 0726  WBC 7.3  HGB 9.5*  HCT 29.8*  PLT 125*  NA 137  K 4.1  CL 108  CO2 20*  BUN 21  CREATININE 0.87  GLUCOSE 143*  CALCIUM 8.3*     Discharge Medications:   Allergies as of 10/23/2018      Reactions   Ace Inhibitors Cough   Amoxicillin-pot Clavulanate    Other reaction(s): GI Upset (intolerance)   Quinolones    Patient was warned about not using Cipro and similar antibiotics. Recent studies have raised concern that fluoroquinolone antibiotics could be associated with an increased risk of aortic aneurysm Fluoroquinolones have non-antimicrobial properties that might jeopardise the integrity of the extracellular matrix of the vascular wall In a  propensity score matched cohort study in [place], there was a 66% increased rate of aortic aneurysm or dissection associated with oral fluoroquinolone use, compared wit   Adhesive [tape] Itching, Rash   Latex Itching, Rash,  Other (See Comments)   Bandaids   Morphine Itching      Medication List    STOP taking these medications   traMADol 50 MG tablet Commonly known as: ULTRAM     TAKE these medications   acetaminophen 500 MG tablet Commonly known as: TYLENOL Take 1,000 mg by mouth every 8 (eight) hours as needed for mild pain or headache.   albuterol 108 (90 Base) MCG/ACT inhaler Commonly known as: VENTOLIN HFA Inhale 2 puffs into the  lungs every 4 (four) hours as needed for wheezing or shortness of breath.   atorvastatin 10 MG tablet Commonly known as: LIPITOR Take 5 mg by mouth daily at 6 PM.   augmented betamethasone dipropionate 0.05 % cream Commonly known as: DIPROLENE-AF Apply 1 application topically 2 (two) times daily as needed for itching.   budesonide-formoterol 80-4.5 MCG/ACT inhaler Commonly known as: SYMBICORT Inhale 2 puffs into the lungs 2 (two) times daily as needed (for shortness of breath).   celecoxib 200 MG capsule Commonly known as: CELEBREX Take 200 mg by mouth 2 (two) times daily as needed for moderate pain.   Coenzyme Q10 100 MG capsule Take 100 mg by mouth 3 (three) times daily.   eucerin cream Apply 1 application topically as needed for dry skin.   furosemide 80 MG tablet Commonly known as: LASIX Take 40-80 mg by mouth See admin instructions. Take 80 mg in the morning and 40 mg in the evening   Jardiance 25 MG Tabs tablet Generic drug: empagliflozin Take 1 tablet by mouth daily.   losartan 25 MG tablet Commonly known as: COZAAR Take 1 tablet (25 mg total) by mouth daily.   methocarbamol 500 MG tablet Commonly known as: ROBAXIN Take 1 tablet (500 mg total) by mouth every 8 (eight) hours as needed for muscle spasms. What changed: Another medication with the same name was added. Make sure you understand how and when to take each.   methocarbamol 500 MG tablet Commonly known as: ROBAXIN Take 1 tablet (500 mg total) by mouth every 6 (six) hours as needed  for muscle spasms. What changed: You were already taking a medication with the same name, and this prescription was added. Make sure you understand how and when to take each.   metoprolol tartrate 25 MG tablet Commonly known as: LOPRESSOR Take 12.5 mg by mouth 2 (two) times daily.   montelukast 10 MG tablet Commonly known as: SINGULAIR Take 10 mg by mouth at bedtime.   nitroGLYCERIN 0.4 MG SL tablet Commonly known as: Nitrostat Place 1 tablet (0.4 mg total) under the tongue every 5 (five) minutes as needed for chest pain.   omeprazole 20 MG capsule Commonly known as: PRILOSEC Take 20 mg by mouth daily.   oxyCODONE 5 MG immediate release tablet Commonly known as: Oxy IR/ROXICODONE Take 1-2 tablets (5-10 mg total) by mouth every 4 (four) hours as needed for moderate pain (pain score 4-6).   potassium chloride SA 20 MEQ tablet Commonly known as: Klor-Con M20 Take 2 tablets (40 mEq total) by mouth 2 (two) times daily. What changed:   how much to take  when to take this  additional instructions   psyllium 58.6 % packet Commonly known as: METAMUCIL Take 1 packet by mouth daily.   rivaroxaban 20 MG Tabs tablet Commonly known as: XARELTO Take 1 tablet (20 mg total) by mouth daily with supper.   sildenafil 100 MG tablet Commonly known as: VIAGRA Take 100 mg by mouth as needed for erectile dysfunction.   spironolactone 25 MG tablet Commonly known as: ALDACTONE Take 25 mg by mouth daily.   SYSTANE OP Place 1 drop into both eyes daily as needed (for dry eyes).   tamsulosin 0.4 MG Caps capsule Commonly known as: FLOMAX Take 0.4 mg by mouth every evening.   topiramate 25 MG capsule Commonly known as: TOPAMAX Take 50 mg by mouth 2 (two) times daily.   Vitamin D-3 125 MCG (5000 UT) Tabs Take 5,000 Units by mouth daily.  Durable Medical Equipment  (From admission, onward)         Start     Ordered   10/18/18 1807  DME 3 n 1  Once     10/18/18 1806    10/18/18 1807  DME Walker rolling  Once    Question:  Patient needs a walker to treat with the following condition  Answer:  Status post total replacement of right hip   10/18/18 1806          Diagnostic Studies: Dg Pelvis Portable  Result Date: 10/18/2018 CLINICAL DATA:  Post right hip arthroplasty. EXAM: PORTABLE PELVIS 1-2 VIEWS COMPARISON:  Preoperative evaluation 03/15/2018 and intraoperative films 10/18/2018 FINDINGS: Post right hip arthroplasty without complicating features. Gas is seen in the soft tissues about the right hip compatible with postoperative change. Limited AP pelvis excludes the upper portion of the iliac crests. Distal aspect of the femoral component of the hip arthroplasty is included in the field of view. IMPRESSION: Uncomplicated appearance of right total hip arthroplasty. Electronically Signed   By: Zetta Bills M.D.   On: 10/18/2018 15:47   Dg C-arm 1-60 Min-no Report  Result Date: 10/18/2018 Fluoroscopy was utilized by the requesting physician.  No radiographic interpretation.   Dg Hip Operative Unilat W Or W/o Pelvis Right  Result Date: 10/18/2018 CLINICAL DATA:  Right total hip replacement EXAM: OPERATIVE RIGHT HIP (WITH PELVIS IF PERFORMED) 1 VIEWS TECHNIQUE: Fluoroscopic spot image(s) were submitted for interpretation post-operatively. COMPARISON:  None. FINDINGS: A single spot fluoroscopic image of the lower pelvis is provided for review. Provided image demonstrates the sequela of right total hip replacement though it is incompletely imaged. Limited visualization of the pelvis demonstrates phleboliths overlying the lower pelvis bilaterally, left greater than right. IMPRESSION: Post right total hip replacement, incompletely imaged. Electronically Signed   By: Sandi Mariscal M.D.   On: 10/18/2018 14:26    Disposition: Discharge disposition: 01-Home or Self Care         Follow-up Information    Mcarthur Rossetti, MD. Go on 10/31/2018.   Specialty:  Orthopedic Surgery Why: at 3:00 pm for 2 week post-op appointment with MD Contact information: [place] [address] [phone]        [facility] Follow up.   Specialty: Home Health Services Why: You are authorized for 5 home health physical therapy visits. Someone from the agency will be in contact to schedule your in home evaluation after discharge. Contact information: [address] [place] [address] [phone]        Sherran Needs, NP Follow up on 10/30/2018.   Specialties: Nurse Practitioner, Cardiology Why: Please arrive 15 minutes early for your 10:00am post-hospital cardiology appointment. Contact information: [place] [address] [phone]            Signed: Mcarthur Rossetti 10/23/2018, 7:05 AM

## 2018-10-23 NOTE — Progress Notes (Signed)
Patient ID: Carlos Dawson, male   DOB: 10/03/1940, 78 y.o.   MRN: [id number] Doing well overall.  Medically stable.  Right hip stable.  Can be discharged to home today.

## 2018-10-23 NOTE — Discharge Instructions (Signed)

## 2018-10-23 NOTE — Progress Notes (Addendum)
PT Cancellation Note  Patient Details Name: Carlos Dawson MRN: [id number] DOB: 10-17-1940   Cancelled Treatment:    Reason Eval/Treat Not Completed: Attempted PT tx session- pt was dressed/ready to go and packing belongings up with wife's assistance. He politely declined session.    Weston Anna, PT Acute Rehabilitation Services Pager: [phone] Office: [phone]

## 2018-10-24 ENCOUNTER — Telehealth: Payer: Self-pay | Admitting: *Deleted

## 2018-10-24 NOTE — Care Plan (Signed)
RNCM spoke with patient for D/C call today. He was discharged from [facility] on 10/23/18 S/P right total hip arthroplasty on 10/18/18. Overall, he states he is doing well, but does have some questions regarding his dressing. He reports his leg is swollen and that he continues to have drainage that is leaking from under the aquacell dressing. An extra dressing was given by the staff at [facility] prior to discharge. He also verbalized he was running a low grade fever of 100. 5 during the night. Emphasized drinking fluids and continuing to use his incentive spirometer given by the hospital. Swelling is expected as well as some drainage. CM reached out to [facility] liaison to see if therapy was going for an evaluation today. Confirmed they will not be coming out today, but tomorrow. Updated patient that HHPT can change the dressing tomorrow when they evaluate him for therapy. Instructed to call to office tomorrow if there are any other needs. Will continue to monitor for CM needs.

## 2018-10-24 NOTE — Telephone Encounter (Signed)
Ortho bundle D/C call completed. 

## 2018-10-25 ENCOUNTER — Telehealth: Payer: Self-pay | Admitting: *Deleted

## 2018-10-25 DIAGNOSIS — Z86711 Personal history of pulmonary embolism: Secondary | ICD-10-CM | POA: Diagnosis not present

## 2018-10-25 DIAGNOSIS — I480 Paroxysmal atrial fibrillation: Secondary | ICD-10-CM | POA: Diagnosis not present

## 2018-10-25 DIAGNOSIS — Z8546 Personal history of malignant neoplasm of prostate: Secondary | ICD-10-CM | POA: Diagnosis not present

## 2018-10-25 DIAGNOSIS — G4733 Obstructive sleep apnea (adult) (pediatric): Secondary | ICD-10-CM | POA: Diagnosis not present

## 2018-10-25 DIAGNOSIS — I5032 Chronic diastolic (congestive) heart failure: Secondary | ICD-10-CM | POA: Diagnosis not present

## 2018-10-25 DIAGNOSIS — K219 Gastro-esophageal reflux disease without esophagitis: Secondary | ICD-10-CM | POA: Diagnosis not present

## 2018-10-25 DIAGNOSIS — Z6841 Body Mass Index (BMI) 40.0 and over, adult: Secondary | ICD-10-CM | POA: Diagnosis not present

## 2018-10-25 DIAGNOSIS — I2510 Atherosclerotic heart disease of native coronary artery without angina pectoris: Secondary | ICD-10-CM | POA: Diagnosis not present

## 2018-10-25 DIAGNOSIS — J4520 Mild intermittent asthma, uncomplicated: Secondary | ICD-10-CM | POA: Diagnosis not present

## 2018-10-25 DIAGNOSIS — Z96641 Presence of right artificial hip joint: Secondary | ICD-10-CM | POA: Diagnosis not present

## 2018-10-25 DIAGNOSIS — Z471 Aftercare following joint replacement surgery: Secondary | ICD-10-CM | POA: Diagnosis not present

## 2018-10-25 DIAGNOSIS — H9190 Unspecified hearing loss, unspecified ear: Secondary | ICD-10-CM | POA: Diagnosis not present

## 2018-10-25 DIAGNOSIS — E785 Hyperlipidemia, unspecified: Secondary | ICD-10-CM | POA: Diagnosis not present

## 2018-10-25 DIAGNOSIS — E119 Type 2 diabetes mellitus without complications: Secondary | ICD-10-CM | POA: Diagnosis not present

## 2018-10-25 DIAGNOSIS — Z87891 Personal history of nicotine dependence: Secondary | ICD-10-CM | POA: Diagnosis not present

## 2018-10-25 DIAGNOSIS — I110 Hypertensive heart disease with heart failure: Secondary | ICD-10-CM | POA: Diagnosis not present

## 2018-10-25 NOTE — Care Plan (Signed)
RNCM call to patient today to check status at 1 week post-op. He verbalized he continues to have some drainage from his surgical wound, but hasn't removed dressing at this time. Therapy to come to his home/hotel today and check wound. Will provide update if needed. Overall, patient states he feels better today, but continues to run a very low grade fever of 99.0. Took temperature while on the phone with RNCM. Will continue to check status early next week. Reminded of appointment with Dr. Ninfa Linden on 10/31/18.

## 2018-10-25 NOTE — Telephone Encounter (Signed)
Ortho bundle 7 day call completed. 

## 2018-10-28 ENCOUNTER — Telehealth: Payer: Self-pay | Admitting: *Deleted

## 2018-10-28 DIAGNOSIS — I110 Hypertensive heart disease with heart failure: Secondary | ICD-10-CM | POA: Diagnosis not present

## 2018-10-28 DIAGNOSIS — Z471 Aftercare following joint replacement surgery: Secondary | ICD-10-CM | POA: Diagnosis not present

## 2018-10-28 DIAGNOSIS — I480 Paroxysmal atrial fibrillation: Secondary | ICD-10-CM | POA: Diagnosis not present

## 2018-10-28 DIAGNOSIS — E119 Type 2 diabetes mellitus without complications: Secondary | ICD-10-CM | POA: Diagnosis not present

## 2018-10-28 DIAGNOSIS — E785 Hyperlipidemia, unspecified: Secondary | ICD-10-CM | POA: Diagnosis not present

## 2018-10-28 DIAGNOSIS — I5032 Chronic diastolic (congestive) heart failure: Secondary | ICD-10-CM | POA: Diagnosis not present

## 2018-10-28 NOTE — Telephone Encounter (Signed)
Ortho bundle call to patient- Other call.

## 2018-10-28 NOTE — Care Plan (Signed)
RNCM call to patient to check status over the weekend after [facility] therapist changing his dressing on Friday. Patient's wife answered as patient was working with therapy that was present for visit at this time. She verbalized he continued to run a low grade fever over the weekend. Having some mucous-like stools, which he has had before. Wife has contacted patient's PCP to inquire if there is something they need to do for this. Overall from the Right THA done on 10/18/18, patient is doing well.

## 2018-10-30 ENCOUNTER — Ambulatory Visit (HOSPITAL_COMMUNITY)
Admission: RE | Admit: 2018-10-30 | Discharge: 2018-10-30 | Disposition: A | Discharge status: Home / Self Care | Payer: Medicare Other | Source: Ambulatory Visit | Attending: Nurse Practitioner | Admitting: Nurse Practitioner

## 2018-10-30 ENCOUNTER — Encounter (HOSPITAL_COMMUNITY): Payer: Self-pay | Admitting: Nurse Practitioner

## 2018-10-30 ENCOUNTER — Other Ambulatory Visit: Payer: Self-pay

## 2018-10-30 VITALS — BP 120/72 | HR 74 | Ht 65.0 in | Wt 240.4 lb

## 2018-10-30 DIAGNOSIS — Z8546 Personal history of malignant neoplasm of prostate: Secondary | ICD-10-CM | POA: Insufficient documentation

## 2018-10-30 DIAGNOSIS — Z79899 Other long term (current) drug therapy: Secondary | ICD-10-CM | POA: Insufficient documentation

## 2018-10-30 DIAGNOSIS — I2510 Atherosclerotic heart disease of native coronary artery without angina pectoris: Secondary | ICD-10-CM | POA: Diagnosis not present

## 2018-10-30 DIAGNOSIS — Z7984 Long term (current) use of oral hypoglycemic drugs: Secondary | ICD-10-CM | POA: Insufficient documentation

## 2018-10-30 DIAGNOSIS — I110 Hypertensive heart disease with heart failure: Secondary | ICD-10-CM | POA: Insufficient documentation

## 2018-10-30 DIAGNOSIS — I480 Paroxysmal atrial fibrillation: Secondary | ICD-10-CM | POA: Diagnosis not present

## 2018-10-30 DIAGNOSIS — E119 Type 2 diabetes mellitus without complications: Secondary | ICD-10-CM | POA: Diagnosis not present

## 2018-10-30 DIAGNOSIS — E785 Hyperlipidemia, unspecified: Secondary | ICD-10-CM | POA: Diagnosis not present

## 2018-10-30 DIAGNOSIS — I714 Abdominal aortic aneurysm, without rupture: Secondary | ICD-10-CM | POA: Diagnosis not present

## 2018-10-30 DIAGNOSIS — Z7901 Long term (current) use of anticoagulants: Secondary | ICD-10-CM | POA: Diagnosis not present

## 2018-10-30 DIAGNOSIS — K219 Gastro-esophageal reflux disease without esophagitis: Secondary | ICD-10-CM | POA: Diagnosis not present

## 2018-10-30 DIAGNOSIS — Z86711 Personal history of pulmonary embolism: Secondary | ICD-10-CM | POA: Insufficient documentation

## 2018-10-30 DIAGNOSIS — G4733 Obstructive sleep apnea (adult) (pediatric): Secondary | ICD-10-CM | POA: Insufficient documentation

## 2018-10-30 DIAGNOSIS — I5032 Chronic diastolic (congestive) heart failure: Secondary | ICD-10-CM | POA: Diagnosis not present

## 2018-10-30 DIAGNOSIS — I509 Heart failure, unspecified: Secondary | ICD-10-CM | POA: Insufficient documentation

## 2018-10-30 DIAGNOSIS — Z471 Aftercare following joint replacement surgery: Secondary | ICD-10-CM | POA: Diagnosis not present

## 2018-10-30 NOTE — Progress Notes (Addendum)
Date:  10/30/2018   ID:  Carlos Dawson, DOB 31-Mar-1940, MRN [id number]    Provider location: [address] [place], [place] [address] Evaluation Performed: Follow up  PCP:  Josetta Huddle, MD  Primary Cardiologist:  Dr. Martinique Primary Electrophysiologist: Dr. Rayann Heman  CC:f/u afib   History of Present Illness: Carlos Dawson is a 78 y.o. male who presents for  f/u recent hospital stay for hip replacement. today.  The patient is referred for  f/u regarding afib seen at time of post op . He is SR today with PAC's.  Pt is currently staying in a local hotel for his immediate recovery as [place] there while another house here  is being built. He is coming along slowly with his rehab.pt does have an apple watch and reviews strips intermittently.  Today, he denies symptoms of palpitations, chest pain, shortness of breath, orthopnea, PND, lower extremity edema, claudication, dizziness, presyncope, syncope, bleeding, or neurologic sequela. The patient is tolerating medications without difficulties and is otherwise without complaint today.   he denies symptoms of cough, fevers, chills, or new SOB worrisome for COVID 19.    he has a BMI of Body mass index is 40 kg/m.Marland Kitchen Filed Weights   10/30/18 1025  Weight: 109 kg    Past Medical History:  Diagnosis Date  . Anticoagulant long-term use   . Aortic root enlargement ([place])   . Ascending aortic aneurysm [facility])    recent scan in October 2012 showing no change; followed by Dr. Servando Snare  . ASCVD (arteriosclerotic cardiovascular disease)    Prior BMS to the 2nd OM in September 2012; with repeat cath in October showing patency  . CAD (coronary artery disease)    a. s/p BMS to 2nd OM in Sept 2012; b. LexiScan Myoview (12/2012):  Inf infarct; bowel and motion artifact make study difficult to interpret; no ischemia; not gated; Low Risk  . CHF (congestive heart failure) ([place])    no recent issues 10/13/14  . Chronic back pain    "all over my back" (05/11/2017)  . Colonic polyp   . Contact lens/glasses fitting   . Diastolic dysfunction   . DVT (deep venous thrombosis) ([place])    ?LLE  . Frequent headaches    "probably weekly" (05/11/2017)  . Generalized headaches    neck stenosis  . GERD (gastroesophageal reflux disease)   . Hearing loss   . Hearing loss    more so on left  . Hemorrhoids   . History of stomach ulcers   . Hypertension   . IBS (irritable bowel syndrome)   . LVH (left ventricular hypertrophy)   . Mild intermittent asthma   . OA (osteoarthritis)    "all over" (05/11/2017)  . Obesities, morbid ([place])   . OSA (obstructive sleep apnea)    PSG 03/30/97 AHI 21, BPAP 13/9  . OSA on CPAP   . PAF (paroxysmal atrial fibrillation) ([place])    a. on Xarelto b. s/p DCCV in 08/2016; b. Tikosyn failed 04/16/17 with plans for Multaq and possible Afib ablation with Dr. Rayann Heman  . Pneumonia    'several times" (05/11/2017)  . Prostate CA [facility])    Oncologist  DR. Daralene Milch [facility] 09/24/14, undetermined tx   prostate; S/P "radiation and hormone injections"  . Pulmonary embolism ([place]) 2008   "both lungs"  . SOB (shortness of breath)    on excertion  .  Thoracic aortic aneurysm ([facility])    Aortic Size Index=     5.0    /Body surface area is 2.43 meters squared. = 2.05  < 2.75 cm/m2      4% risk per year 2.75 to 4.25          8% risk per year > 4.25 cm/m2    20% risk per year   Stable aneurysmal dilation of the ascending aorta with maximum AP diameter of 4.8 cm. Stable area of narrowing of the proximal most portion of the descending aorta measuring 2 cm., previously identified as an area of coarctation. No evidence of aortic dissection.  Coronary artery disease.  Normal appearance of the lungs.   Electronically Signed   By: Fidela Salisbury M.D.   On: 10/01/2014 08:50    . Type II diabetes mellitus ([place])    metphormin, average 154 dx 2017   Past Surgical History:   Procedure Laterality Date  . ACHILLES TENDON REPAIR Bilateral   . AORTIC ARCH ANGIOGRAPHY N/A 03/13/2017   Procedure: AORTIC ARCH ANGIOGRAPHY;  Surgeon: Martinique, [name], MD;  Location: [facility];  Service: Cardiovascular;  Laterality: N/A;  . APPENDECTOMY    . ATRIAL FIBRILLATION ABLATION  05/11/2017  . ATRIAL FIBRILLATION ABLATION N/A 05/11/2017   Procedure: ATRIAL FIBRILLATION ABLATION;  Surgeon: [name], MD;  Location: [facility];  Service: Cardiovascular;  Laterality: N/A;  . BACK SURGERY     "I've had 7 back and 1 neck ORs" (05/11/2017)  . BIOPSY  03/14/2018   Procedure: BIOPSY;  Surgeon: Ronnette Juniper, MD;  Location: [facility];  Service: Gastroenterology;;  EGD and Colon  . CARDIAC CATHETERIZATION  2006  . CARPAL TUNNEL RELEASE Bilateral    LEFT  . CATARACT EXTRACTION W/ INTRAOCULAR LENS  IMPLANT, BILATERAL Bilateral   . CERVICAL SPINE SURGERY  06/02/2010   lower back and neck  . COLONOSCOPY N/A 03/14/2018   Procedure: COLONOSCOPY;  Surgeon: Ronnette Juniper, MD;  Location: [facility];  Service: Gastroenterology;  Laterality: N/A;  . COLONOSCOPY WITH PROPOFOL N/A 12/29/2014   Procedure: COLONOSCOPY WITH PROPOFOL;  Surgeon: Garlan Fair, MD;  Location: [facility];  Service: Endoscopy;  Laterality: N/A;  . CORONARY ANGIOPLASTY WITH STENT PLACEMENT  October 2012  . CORONARY STENT PLACEMENT  Sept 2012   2nd OM with BMS  . ESOPHAGOGASTRODUODENOSCOPY N/A 03/14/2018   Procedure: ESOPHAGOGASTRODUODENOSCOPY (EGD);  Surgeon: Ronnette Juniper, MD;  Location: [facility];  Service: Gastroenterology;  Laterality: N/A;  . HEMORROIDECTOMY    . LAMINECTOMY  05/30/2012   L 4 L5  . LAMINECTOMY WITH POSTERIOR LATERAL ARTHRODESIS LEVEL 3 N/A 10/18/2016   Procedure: Posterior Lateral Fusion - Lumbar One-Four, segmental instrumentation Lumbar One-Five,  decompression,;  Surgeon: Eustace Moore, MD;  Location: [facility] OR;  Service: Neurosurgery;  Laterality: N/A;  . LAPAROSCOPIC  CHOLECYSTECTOMY    . LAPAROSCOPIC GASTRIC BANDING    . LEFT AND RIGHT HEART CATHETERIZATION WITH CORONARY ANGIOGRAM N/A 05/07/2014   Procedure: LEFT AND RIGHT HEART CATHETERIZATION WITH CORONARY ANGIOGRAM;  Surgeon: [name] Martinique, MD;  Location: [facility] C[facility] LAB;  Service: Cardiovascular;  Laterality: N/A;  . LEFT HEART CATH AND CORONARY ANGIOGRAPHY N/A 03/13/2017   Procedure: LEFT HEART CATH AND CORONARY ANGIOGRAPHY;  Surgeon: Martinique, [name], MD;  Location: [facility];  Service: Cardiovascular;  Laterality: N/A;  . LUMBAR LAMINECTOMY/DECOMPRESSION MICRODISCECTOMY N/A 05/04/2016   Procedure: Laminectomy and Foraminotomy - Thoracic twelve-Lumbar one -Posterior Fusion Lumbar one-two;  Surgeon: Shanon Brow  Adah Salvage, MD;  Location: [facility];  Service: Neurosurgery;  Laterality: N/A;  . POLYPECTOMY  03/14/2018   Procedure: POLYPECTOMY;  Surgeon: Ronnette Juniper, MD;  Location: [facility];  Service: Gastroenterology;;  . POSTERIOR LUMBAR FUSION  10/18/2016  . SHOULDER OPEN ROTATOR CUFF REPAIR Bilateral   . TONSILLECTOMY AND ADENOIDECTOMY    . TOTAL HIP ARTHROPLASTY Right 10/18/2018   Procedure: RIGHT TOTAL HIP ARTHROPLASTY ANTERIOR APPROACH;  Surgeon: Mcarthur Rossetti, MD;  Location: [facility] ORS;  Service: Orthopedics;  Laterality: Right;  . TRIGGER FINGER RELEASE     LEFT  . UVULOPALATOPHARYNGOPLASTY    . VASECTOMY       Current Outpatient Medications  Medication Sig Dispense Refill  . albuterol (PROVENTIL HFA;VENTOLIN HFA) 108 (90 Base) MCG/ACT inhaler Inhale 2 puffs into the lungs every 4 (four) hours as needed for wheezing or shortness of breath.    Marland Kitchen atorvastatin (LIPITOR) 10 MG tablet Take 5 mg by mouth daily at 6 PM.     . budesonide-formoterol (SYMBICORT) 80-4.5 MCG/ACT inhaler Inhale 2 puffs into the lungs 2 (two) times daily as needed (for shortness of breath).     . celecoxib (CELEBREX) 200 MG capsule Take 200 mg by mouth 2 (two) times daily as needed for moderate pain.  0  . Cholecalciferol  (VITAMIN D-3) 5000 units TABS Take 5,000 Units by mouth daily.    . Coenzyme Q10 100 MG capsule Take 100 mg by mouth daily.     . furosemide (LASIX) 80 MG tablet Take 40-80 mg by mouth See admin instructions. Take 80 mg in the morning and 40 mg in the evening     . JARDIANCE 25 MG TABS tablet Take 1 tablet by mouth daily.    Marland Kitchen losartan (COZAAR) 25 MG tablet Take 1 tablet (25 mg total) by mouth daily. 90 tablet 1  . methocarbamol (ROBAXIN) 500 MG tablet Take 1 tablet (500 mg total) by mouth every 8 (eight) hours as needed for muscle spasms. 60 tablet 1  . metoprolol tartrate (LOPRESSOR) 25 MG tablet Take 12.5 mg by mouth 2 (two) times daily.    . montelukast (SINGULAIR) 10 MG tablet Take 10 mg by mouth at bedtime.    Marland Kitchen omeprazole (PRILOSEC) 20 MG capsule Take 20 mg by mouth daily.    Marland Kitchen oxyCODONE (OXY IR/ROXICODONE) 5 MG immediate release tablet Take 1-2 tablets (5-10 mg total) by mouth every 4 (four) hours as needed for moderate pain (pain score 4-6). 40 tablet 0  . potassium chloride SA (KLOR-CON M20) 20 MEQ tablet Take 2 tablets (40 mEq total) by mouth 2 (two) times daily. (Patient taking differently: Take 20-40 mEq by mouth See admin instructions. 2 tabs in the morning, 1 tab in the evening) 360 tablet 2  . psyllium (METAMUCIL) 58.6 % packet Take 1 packet by mouth daily.    . rivaroxaban (XARELTO) 20 MG TABS tablet Take 1 tablet (20 mg total) by mouth daily with supper. 30 tablet   . spironolactone (ALDACTONE) 25 MG tablet Take 25 mg by mouth daily.    . tamsulosin (FLOMAX) 0.4 MG CAPS Take 0.4 mg by mouth every evening.     . topiramate (TOPAMAX) 25 MG capsule Take 50 mg by mouth 2 (two) times daily.     Marland Kitchen acetaminophen (TYLENOL) 500 MG tablet Take 1,000 mg by mouth every 8 (eight) hours as needed for mild pain or headache.     . augmented betamethasone dipropionate (DIPROLENE-AF) 0.05 % cream Apply 1 application topically  2 (two) times daily as needed for itching.  3  . nitroGLYCERIN  (NITROSTAT) 0.4 MG SL tablet Place 1 tablet (0.4 mg total) under the tongue every 5 (five) minutes as needed for chest pain. (Patient not taking: Reported on 10/30/2018) 25 tablet 5  . Polyethyl Glycol-Propyl Glycol (SYSTANE OP) Place 1 drop into both eyes daily as needed (for dry eyes).     . sildenafil (VIAGRA) 100 MG tablet Take 100 mg by mouth as needed for erectile dysfunction.     . Skin Protectants, Misc. (EUCERIN) cream Apply 1 application topically as needed for dry skin.     No current facility-administered medications for this encounter.     Allergies:   Ace inhibitors, Amoxicillin-pot clavulanate, Quinolones, Adhesive [tape], Latex, and Morphine   Social History:  The patient  reports that he quit smoking about 26 years ago. He has a 45.00 pack-year smoking history. He has never used smokeless tobacco. He reports that he does not drink alcohol or use drugs.   Family History:  The patient's  family history includes Diabetes in his mother; Emphysema (age of onset: 63) in his father; Heart attack in his sister; Heart disease in his mother; Other in his mother.    ROS:  Please see the history of present illness.   All other systems are personally reviewed and negative.   Exam:GEN- The patient is well appearing, alert and oriented x 3 today.   Head- normocephalic, atraumatic Eyes-  Sclera clear, conjunctiva pink Ears- hearing intact Oropharynx- clear Lungs- Clear to ausculation bilaterally, normal work of breathing Heart- Regular rate and rhythm, no murmurs, rubs or gallops, PMI not laterally displaced GI- soft, NT, ND, + BS Extremities- no clubbing, cyanosis, or edema  EKG- afib at 83 bpm, qrs int 132 ms, qtc 424 ms    Recent Labs: 10/21/2018: ALT 13; BUN 21; Creatinine, Ser 0.87; Hemoglobin 9.5; Platelets 125; Potassium 4.1; Sodium 137  personally reviewed    Other studies personally reviewed: Additional studies/ records that were reviewed today include: epic records       ASSESSMENT AND PLAN:  1.  Paroxysmal  atrial fibrillation Doing well staying in SR  Continue to monitor via apple watch  No change in approach Continue with cpap No issues with  eliquis 5 mg bid This patients CHA2DS2-VASc Score and unadjusted Ischemic Stroke Rate (% per year) is equal to 7.2 % stroke rate/year from a score of 5   2. HTN Stable  3. CAD No anginal SYMPTOMS Did have some chest pressure in the hospital aftr surgery, was seen by cardiology and felt no further w/u was needed  Follow-up: per  Dr. Rayann Heman as per recall in November, pt requests a virtual visit then [place]  Dr. Martinique as recall  Current medicines are reviewed at length with the patient today.   The patient does not have concerns regarding his medicines.  The following changes were made today:  none  Labs/ tests ordered today include: none Orders Placed This Encounter  Procedures  . EKG 12-Lead    Patient Risk:  after full review of this patients clinical status, I feel that they are at moderate risk at this time.   Today, I have spent 15  minutes with the patient with telehealth technology discussing above  Signed, Roderic Palau NP  10/30/2018 1:17 PM  [facility] [address] [place], [place] [address] [phone]   I hereby voluntarily request,  consent and authorize the [facility] and its employed or contracted physicians, physician assistants, nurse practitioners or other licensed health care professionals (the Practitioner), to provide me with telemedicine health care services (the "Services") as deemed necessary by the treating Practitioner. I acknowledge and consent to receive the Services by the Practitioner via telemedicine. I understand that the telemedicine visit will involve communicating with the Practitioner through live audiovisual communication technology and the disclosure of certain medical information by  electronic transmission. I acknowledge that I have been given the opportunity to request an in-person assessment or other available alternative prior to the telemedicine visit and am voluntarily participating in the telemedicine visit.   I understand that I have the right to withhold or withdraw my consent to the use of telemedicine in the course of my care at any time, without affecting my right to future care or treatment, and that the Practitioner or I may terminate the telemedicine visit at any time. I understand that I have the right to inspect all information obtained and/or recorded in the course of the telemedicine visit and may receive copies of available information for a reasonable fee.  I understand that some of the potential risks of receiving the Services via telemedicine include:   Delay or interruption in medical evaluation due to technological equipment failure or disruption;  Information transmitted may not be sufficient (e.g. poor resolution of images) to allow for appropriate medical decision making by the Practitioner; and/or  In rare instances, security protocols could fail, causing a breach of personal health information.   Furthermore, I acknowledge that it is my responsibility to provide information about my medical history, conditions and care that is complete and accurate to the best of my ability. I acknowledge that Practitioner's advice, recommendations, and/or decision may be based on factors not within their control, such as incomplete or inaccurate data provided by me or distortions of diagnostic images or specimens that may result from electronic transmissions. I understand that the practice of medicine is not an exact science and that Practitioner makes no warranties or guarantees regarding treatment outcomes. I acknowledge that I will receive a copy of this consent concurrently upon execution via email to the email address I last provided but may also request a printed copy  by calling the office of the [facility].  I understand that my insurance will be billed for this visit.   I have read or had this consent read to me.  I understand the contents of this consent, which adequately explains the benefits and risks of the Services being provided via telemedicine.  I have been provided ample opportunity to ask questions regarding this consent and the Services and have had my questions answered to my satisfaction.  I give my informed consent for the services to be provided through the use of telemedicine in my medical care  By participating in this telemedicine visit I agree to the above.

## 2018-10-31 ENCOUNTER — Ambulatory Visit (INDEPENDENT_AMBULATORY_CARE_PROVIDER_SITE_OTHER): Payer: Medicare Other | Admitting: Orthopaedic Surgery

## 2018-10-31 ENCOUNTER — Encounter: Payer: Self-pay | Admitting: Orthopaedic Surgery

## 2018-10-31 DIAGNOSIS — E119 Type 2 diabetes mellitus without complications: Secondary | ICD-10-CM | POA: Diagnosis not present

## 2018-10-31 DIAGNOSIS — Z471 Aftercare following joint replacement surgery: Secondary | ICD-10-CM | POA: Diagnosis not present

## 2018-10-31 DIAGNOSIS — Z96641 Presence of right artificial hip joint: Secondary | ICD-10-CM

## 2018-10-31 DIAGNOSIS — I110 Hypertensive heart disease with heart failure: Secondary | ICD-10-CM | POA: Diagnosis not present

## 2018-10-31 DIAGNOSIS — E785 Hyperlipidemia, unspecified: Secondary | ICD-10-CM | POA: Diagnosis not present

## 2018-10-31 DIAGNOSIS — I480 Paroxysmal atrial fibrillation: Secondary | ICD-10-CM | POA: Diagnosis not present

## 2018-10-31 DIAGNOSIS — I5032 Chronic diastolic (congestive) heart failure: Secondary | ICD-10-CM | POA: Diagnosis not present

## 2018-10-31 MED ORDER — DOXYCYCLINE HYCLATE 100 MG PO TABS: 100.0000 mg | ORAL_TABLET | Freq: Two times a day (BID) | ORAL | 0 refills | Status: DC

## 2018-10-31 NOTE — Progress Notes (Signed)
HPI: Carlos Dawson returns today status post right total hip arthroplasty 10/18/2018.  He is overall doing well.  He is on chronic Xarelto.  He is ambulating with a cane and states his range of motion and strength are slowly increasing.  He has no concerns.  Physical exam: Right hip surgical incisions healing well approximated well with staples.  No signs of gross infection.  However he does have slight erythema distal incision and some bleeding due to the abrasion at the proximal portion of the incision.  Right hip internal and external rotation somewhat limited fluid motion.  Right calf supple nontender.  Dorsiflexion plantarflexion of the right ankle intact.  Impression: Status post right total hip arthroplasty  Plan: Staples removed Steri-Strips applied he is able to get incision wet in shower.  Did place him on some doxycycline due to the abrasion at the top of the wound and also due to the slight erythema distal wound.  Would like to see him back in 2 weeks for wound check.  He is to keep the proximal incision dry.  He will follow-up with [place] sooner if there is any questions or concerns.

## 2018-11-04 ENCOUNTER — Other Ambulatory Visit: Payer: Self-pay

## 2018-11-04 ENCOUNTER — Ambulatory Visit (INDEPENDENT_AMBULATORY_CARE_PROVIDER_SITE_OTHER): Payer: Medicare Other | Admitting: Physician Assistant

## 2018-11-04 ENCOUNTER — Telehealth: Payer: Self-pay | Admitting: *Deleted

## 2018-11-04 ENCOUNTER — Encounter: Payer: Self-pay | Admitting: Physician Assistant

## 2018-11-04 DIAGNOSIS — Z96641 Presence of right artificial hip joint: Secondary | ICD-10-CM

## 2018-11-04 NOTE — Progress Notes (Signed)
HPI: [name] returns today for wound check right hip due to bleeding that started on Sunday.  He had some pain in the hip on Saturday.  Has had no fevers chills.  Physical exam: Right hip surgical incision slight dehiscence of the distal incision.  Able to express serosanguineous fluid from the distal portion of the incision but no frank purulence.  Erythema about the distal incision is definitely improved as is the erythema and abrasions at the proximal end of the incision.  Medial ankle incision is well approximated.  Impression: 16 days status post right total hip arthroplasty  Plan: He will wash the wound daily with antibacterial soap.  He is given mupirocin to apply to the proximal end of the incision and distal incision.  Continue his antibiotics.  Follow-up with [place] on the 22nd as planned sooner if there is any questions or concerns.  Questions were encouraged and answered

## 2018-11-04 NOTE — Care Plan (Signed)
RNCM received VM call from patient's wife on Sunday afternoon that was not received until back in office on Monday a.m. She informed that her husband's Right hip incision was bleeding and has opened up at the bottom of the incision. Called patient today and discussed. Picture sent via text message to RNCM's phone, which was shown to Dr. Trevor Mace PA. Provided instructions that PA would be calling in some Mupirocin to place topically to their pharmacy. Also instructed to place more steri-strips over area and apply pressure dressing. Patient's wife verbalized she is concerned over continued oozing at the site. Patient is on Xarelto for anti-coagulation. Offered suggestion of being seen in office today by PA or MD. Patient currently is in [facility] and will have to travel 3 1/2 hours to get to office, but agrees he would like to be seen instead of discussing over the phone. Made PA and MD aware. Appointment scheduled for 1:45 pm today.

## 2018-11-04 NOTE — Telephone Encounter (Signed)
2 week Ortho Care Plan note completed.

## 2018-11-05 ENCOUNTER — Telehealth: Payer: Self-pay | Admitting: *Deleted

## 2018-11-05 NOTE — Care Plan (Signed)
RNCM received call from patient this morning. States after being seen by Benita Stabile, PA and Dr. Ninfa Linden yesterday for a wound check, he and his wife went to dinner. After dinner he was walking to the car and fell. Thinks he fell more on his non-operative side and buttocks. Having no new pain, bruising or swelling. No increased drainage from his wound noticed by him. [place] and wanted to make sure our office was aware. RNCM updated MD/PA. No new orders received today. Patient has f/u scheduled for 11/14/18.

## 2018-11-05 NOTE — Telephone Encounter (Signed)
Ortho bundle call . 

## 2018-11-11 ENCOUNTER — Telehealth: Payer: Self-pay | Admitting: *Deleted

## 2018-11-11 NOTE — Telephone Encounter (Signed)
Received call from Mr. Corr today stating that after his fall last week, he has not improved. He has gone from getting around well with a cane to using the walker again. Today, he states he can barely put weight on the surgical hip. Most of the pain is in the very top of thigh into the groin area and in low back around his pelvis he states. Surgical incision still draining some, but improved since re-dressed last week. Discussed with PA and Appointment that was scheduled for this Thursday, 11/14/18 now moved to Wednesday afternoon, 11/13/18. Just FYI.

## 2018-11-13 ENCOUNTER — Ambulatory Visit (INDEPENDENT_AMBULATORY_CARE_PROVIDER_SITE_OTHER): Payer: Medicare Other | Admitting: Orthopaedic Surgery

## 2018-11-13 ENCOUNTER — Encounter: Payer: Self-pay | Admitting: Orthopaedic Surgery

## 2018-11-13 ENCOUNTER — Ambulatory Visit (INDEPENDENT_AMBULATORY_CARE_PROVIDER_SITE_OTHER): Payer: Medicare Other

## 2018-11-13 ENCOUNTER — Other Ambulatory Visit: Payer: Self-pay

## 2018-11-13 DIAGNOSIS — M25551 Pain in right hip: Secondary | ICD-10-CM | POA: Diagnosis not present

## 2018-11-13 DIAGNOSIS — Z96641 Presence of right artificial hip joint: Secondary | ICD-10-CM

## 2018-11-13 MED ORDER — DOXYCYCLINE HYCLATE 100 MG PO TABS: 100.0000 mg | ORAL_TABLET | Freq: Two times a day (BID) | ORAL | 0 refills | Status: DC

## 2018-11-13 NOTE — Progress Notes (Signed)
The patient is well-known to me.  He is 26 days status post a right total hip arthroplasty through direct anterior approach.  He is someone who is morbidly obese.  He has had a mechanical fall since his last visit with [place] and he is complaining of worsening right hip pain with pain in the groin and around his hip in general.  He has had previous low lumbar spine surgery.  We have been following him closely postoperatively due to some breakdown of his incision in the groin crease and some drainage.  We have had him on doxycycline.  He denies any fever, chills, nausea, vomiting.  He is using his walker to ambulate and does exhibit a decent amount of pain.  It is hard to get him up on the exam table due to his pain but he is able to do so.  I did assess his leg lengths and they were equal.  His incision is healing nicely and I did unroofed some granulation tissue to make sure I get good bleeding tissue.  There was no evidence of gross purulence.  I can put his hip through internal and external rotation without much difficulty at all.  An AP pelvis and lateral of the right hip are obtained and compared to previous films.  His hip implants are well-seated.  I see no evidence of fracture or any issues related to the femoral component.  I see no pelvic fracture either.  I gave him reassurance that his implant looks still well-seated.  Given the wounds around his hip incision and given the fact that he is obese I would like to still continue his doxycycline.  I would like to see him back in 2 weeks for repeat wound check of his right hip.  He will continue to work on activities as he tolerates but go slow and still ambulate with his walker.  All questions and concerns were answered and addressed.

## 2018-11-14 ENCOUNTER — Telehealth: Payer: Self-pay | Admitting: *Deleted

## 2018-11-14 ENCOUNTER — Ambulatory Visit: Payer: Medicare Other | Admitting: Orthopaedic Surgery

## 2018-11-14 NOTE — Telephone Encounter (Signed)
30 day Ortho bundle call completed.

## 2018-11-14 NOTE — Care Plan (Signed)
Patient seen in office on 11/13/18 for a post-op visit related to recent fall. This was rescheduled for 1 day early for his 30 day appointment. He is 26 days post-op from a Right total hip. He was doing well and had a mechanical fall last week with worsening right hip pain into the groin area. Dr. Ninfa Linden obtained X-rays today and noted that the implant remains intact and no new fractures. Looked at incision today and would like for patient to remain on antibiotic. This was called into his pharmacy. Reviewed 30 day patient satisfaction survey with patient in office today and completed. 1. Before surgery, I was provided sufficient education regarding my surgery and the bundle program. Patient answer- strongly agree 2. I was satisfied with the care provided by the nurse at the facility where my surgery was performed. Patient answer- Strongly agree 3. Following surgery, I received sufficient post operative care instructions. Patient answer-Strongly agree 4. I would recommend my surgeon and this bundle program to others. Patient answer- Strongly agree Comments from patient: "Additional care was taken even after post-op- I am very pleased" F/U with Dr. Ninfa Linden In 2 weeks on 11/26/18.

## 2018-11-20 ENCOUNTER — Other Ambulatory Visit: Payer: Self-pay

## 2018-11-20 ENCOUNTER — Telehealth (INDEPENDENT_AMBULATORY_CARE_PROVIDER_SITE_OTHER): Payer: Medicare Other | Admitting: Internal Medicine

## 2018-11-20 ENCOUNTER — Encounter: Payer: Self-pay | Admitting: Internal Medicine

## 2018-11-20 VITALS — BP 115/71 | HR 59 | Ht 65.0 in | Wt 227.0 lb

## 2018-11-20 DIAGNOSIS — E119 Type 2 diabetes mellitus without complications: Secondary | ICD-10-CM | POA: Diagnosis not present

## 2018-11-20 DIAGNOSIS — D649 Anemia, unspecified: Secondary | ICD-10-CM | POA: Diagnosis not present

## 2018-11-20 DIAGNOSIS — I4891 Unspecified atrial fibrillation: Secondary | ICD-10-CM | POA: Diagnosis not present

## 2018-11-20 DIAGNOSIS — G4733 Obstructive sleep apnea (adult) (pediatric): Secondary | ICD-10-CM

## 2018-11-20 DIAGNOSIS — I480 Paroxysmal atrial fibrillation: Secondary | ICD-10-CM | POA: Diagnosis not present

## 2018-11-20 DIAGNOSIS — J45909 Unspecified asthma, uncomplicated: Secondary | ICD-10-CM | POA: Diagnosis not present

## 2018-11-20 DIAGNOSIS — I10 Essential (primary) hypertension: Secondary | ICD-10-CM | POA: Diagnosis not present

## 2018-11-20 DIAGNOSIS — I5030 Unspecified diastolic (congestive) heart failure: Secondary | ICD-10-CM | POA: Diagnosis not present

## 2018-11-20 DIAGNOSIS — J4530 Mild persistent asthma, uncomplicated: Secondary | ICD-10-CM | POA: Diagnosis not present

## 2018-11-20 DIAGNOSIS — C61 Malignant neoplasm of prostate: Secondary | ICD-10-CM | POA: Diagnosis not present

## 2018-11-20 DIAGNOSIS — E785 Hyperlipidemia, unspecified: Secondary | ICD-10-CM | POA: Diagnosis not present

## 2018-11-20 DIAGNOSIS — I2510 Atherosclerotic heart disease of native coronary artery without angina pectoris: Secondary | ICD-10-CM | POA: Diagnosis not present

## 2018-11-20 DIAGNOSIS — I509 Heart failure, unspecified: Secondary | ICD-10-CM | POA: Diagnosis not present

## 2018-11-20 NOTE — Progress Notes (Signed)
Electrophysiology TeleHealth Note   Due to national recommendations of social distancing due to COVID 19, an audio/video telehealth visit is felt to be most appropriate for this patient at this time.  See MyChart message from today for the patient's consent to telehealth for [facility].   Date:  11/20/2018   ID:  Carlos ZIRKELBACH, DOB 09-15-1940, MRN [id number]  Location: patient's second home in [place]  Provider location:  [facility]  Evaluation Performed: Follow-up visit  PCP:  Carlos Huddle, Dawson   Electrophysiologist:  Carlos Dawson  Chief Complaint:  palpitations  History of Present Illness:    Carlos Dawson is a 78 y.o. male who presents via telehealth conferencing today.  Since last being seen in our clinic, the patient reports doing very well.  He is recovering from hip surgery.  He is unaware of afib.  His apple watch says "afib" but most episodes are sinus with PACs and not afib.  Today, he denies symptoms of palpitations, chest pain, shortness of breath,  lower extremity edema, dizziness, presyncope, or syncope.  The patient is otherwise without complaint today.  The patient denies symptoms of fevers, chills, cough, or new SOB worrisome for COVID 19.  Past Medical History:  Diagnosis Date  . Anticoagulant long-term use   . Aortic root enlargement ([place])   . Ascending aortic aneurysm [facility])    recent scan in October 2012 showing no change; followed by Carlos. Servando Snare  . ASCVD (arteriosclerotic cardiovascular disease)    Prior BMS to the 2nd OM in September 2012; with repeat cath in October showing patency  . CAD (coronary artery disease)    a. s/p BMS to 2nd OM in Sept 2012; b. LexiScan Myoview (12/2012):  Inf infarct; bowel and motion artifact make study difficult to interpret; no ischemia; not gated; Low Risk  . CHF (congestive heart failure) ([place])    no recent issues 10/13/14  . Chronic back pain    "all over my back" (05/11/2017)  . Colonic polyp   .  Contact lens/glasses fitting   . Diastolic dysfunction   . DVT (deep venous thrombosis) ([place])    ?LLE  . Frequent headaches    "probably weekly" (05/11/2017)  . Generalized headaches    neck stenosis  . GERD (gastroesophageal reflux disease)   . Hearing loss   . Hearing loss    more so on left  . Hemorrhoids   . History of stomach ulcers   . Hypertension   . IBS (irritable bowel syndrome)   . LVH (left ventricular hypertrophy)   . Mild intermittent asthma   . OA (osteoarthritis)    "all over" (05/11/2017)  . Obesities, morbid ([place])   . OSA (obstructive sleep apnea)    PSG 03/30/97 AHI 21, BPAP 13/9  . OSA on CPAP   . PAF (paroxysmal atrial fibrillation) ([place])    a. on Xarelto b. s/p DCCV in 08/2016; b. Tikosyn failed 04/16/17 with plans for Multaq and possible Afib ablation with Carlos. Rayann Dawson  . Pneumonia    'several times" (05/11/2017)  . Prostate CA [facility])    Oncologist  Carlos. [facility] dx 09/24/14, undetermined tx   prostate; S/P "radiation and hormone injections"  . Pulmonary embolism ([place]) 2008   "both lungs"  . SOB (shortness of breath)    on excertion  . Thoracic aortic aneurysm ([facility])    Aortic Size Index=     5.0    /Body surface area is 2.43  meters squared. = 2.05  < 2.75 cm/m2      4% risk per year 2.75 to 4.25          8% risk per year > 4.25 cm/m2    20% risk per year   Stable aneurysmal dilation of the ascending aorta with maximum AP diameter of 4.8 cm. Stable area of narrowing of the proximal most portion of the descending aorta measuring 2 cm., previously identified as an area of coarctation. No evidence of aortic dissection.  Coronary artery disease.  Normal appearance of the lungs.   Electronically Signed   By: Carlos Salisbury Dawson.D.   On: 10/01/2014 08:50    . Type II diabetes mellitus ([place])    metphormin, average 154 dx 2017    Past Surgical History:  Procedure Laterality Date  . ACHILLES TENDON REPAIR Bilateral   . AORTIC ARCH ANGIOGRAPHY N/A 03/13/2017    Procedure: AORTIC ARCH ANGIOGRAPHY;  Surgeon: Martinique, Peter Dawson, Dawson;  Location: [place] CV LAB;  Service: Cardiovascular;  Laterality: N/A;  . APPENDECTOMY    . ATRIAL FIBRILLATION ABLATION  05/11/2017  . ATRIAL FIBRILLATION ABLATION N/A 05/11/2017   Procedure: ATRIAL FIBRILLATION ABLATION;  Surgeon: Carlos Grayer, Dawson;  Location: [place] CV LAB;  Service: Cardiovascular;  Laterality: N/A;  . BACK SURGERY     "I've had 7 back and 1 neck ORs" (05/11/2017)  . BIOPSY  03/14/2018   Procedure: BIOPSY;  Surgeon: Carlos Dawson;  Location: [facility];  Service: Gastroenterology;;  EGD and Colon  . CARDIAC CATHETERIZATION  2006  . CARPAL TUNNEL RELEASE Bilateral    LEFT  . CATARACT EXTRACTION W/ INTRAOCULAR LENS  IMPLANT, BILATERAL Bilateral   . CERVICAL SPINE SURGERY  06/02/2010   lower back and neck  . COLONOSCOPY N/A 03/14/2018   Procedure: COLONOSCOPY;  Surgeon: Carlos Dawson;  Location: [facility];  Service: Gastroenterology;  Laterality: N/A;  . COLONOSCOPY WITH PROPOFOL N/A 12/29/2014   Procedure: COLONOSCOPY WITH PROPOFOL;  Surgeon: Carlos Dawson;  Location: [facility];  Service: Endoscopy;  Laterality: N/A;  . CORONARY ANGIOPLASTY WITH STENT PLACEMENT  October 2012  . CORONARY STENT PLACEMENT  Sept 2012   2nd OM with BMS  . ESOPHAGOGASTRODUODENOSCOPY N/A 03/14/2018   Procedure: ESOPHAGOGASTRODUODENOSCOPY (EGD);  Surgeon: Carlos Dawson;  Location: [facility];  Service: Gastroenterology;  Laterality: N/A;  . HEMORROIDECTOMY    . LAMINECTOMY  05/30/2012   L 4 L5  . LAMINECTOMY WITH POSTERIOR LATERAL ARTHRODESIS LEVEL 3 N/A 10/18/2016   Procedure: Posterior Lateral Fusion - Lumbar One-Four, segmental instrumentation Lumbar One-Five,  decompression,;  Surgeon: Carlos Dawson;  Location: [facility] OR;  Service: Neurosurgery;  Laterality: N/A;  . LAPAROSCOPIC CHOLECYSTECTOMY    . LAPAROSCOPIC GASTRIC BANDING    . LEFT AND RIGHT HEART CATHETERIZATION WITH CORONARY ANGIOGRAM N/A  05/07/2014   Procedure: LEFT AND RIGHT HEART CATHETERIZATION WITH CORONARY ANGIOGRAM;  Surgeon: Peter Dawson Martinique, Dawson;  [facility];  Service: Cardiovascular;  Laterality: N/A;  . LEFT HEART CATH AND CORONARY ANGIOGRAPHY N/A 03/13/2017   Procedure: LEFT HEART CATH AND CORONARY ANGIOGRAPHY;  Surgeon: Martinique, Peter Dawson, Dawson;  Location: [place] CV LAB;  Service: Cardiovascular;  Laterality: N/A;  . LUMBAR LAMINECTOMY/DECOMPRESSION MICRODISCECTOMY N/A 05/04/2016   Procedure: Laminectomy and Foraminotomy - Thoracic twelve-Lumbar one -Posterior Fusion Lumbar one-two;  Surgeon: Carlos Dawson;  Location: [place];  Service: Neurosurgery;  Laterality: N/A;  . POLYPECTOMY  03/14/2018   Procedure: POLYPECTOMY;  Surgeon: Carlos Dawson;  Location: [facility];  Service: Gastroenterology;;  . POSTERIOR LUMBAR FUSION  10/18/2016  . SHOULDER OPEN ROTATOR CUFF REPAIR Bilateral   . TONSILLECTOMY AND ADENOIDECTOMY    . TOTAL HIP ARTHROPLASTY Right 10/18/2018   Procedure: RIGHT TOTAL HIP ARTHROPLASTY ANTERIOR APPROACH;  Surgeon: Mcarthur Rossetti, Dawson;  Location: [facility] ORS;  Service: Orthopedics;  Laterality: Right;  . TRIGGER FINGER RELEASE     LEFT  . UVULOPALATOPHARYNGOPLASTY    . VASECTOMY      Current Outpatient Medications  Medication Sig Dispense Refill  . acetaminophen (TYLENOL) 500 MG tablet Take 1,000 mg by mouth every 8 (eight) hours as needed for mild pain or headache.     . albuterol (PROVENTIL HFA;VENTOLIN HFA) 108 (90 Base) MCG/ACT inhaler Inhale 2 puffs into the lungs every 4 (four) hours as needed for wheezing or shortness of breath.    Marland Kitchen atorvastatin (LIPITOR) 10 MG tablet Take 5 mg by mouth daily at 6 PM.     . augmented betamethasone dipropionate (DIPROLENE-AF) 0.05 % cream Apply 1 application topically 2 (two) times daily as needed for itching.  3  . budesonide-formoterol (SYMBICORT) 80-4.5 MCG/ACT inhaler Inhale 2 puffs into the lungs 2 (two) times daily as needed (for shortness  of breath).     . celecoxib (CELEBREX) 200 MG capsule Take 200 mg by mouth 2 (two) times daily as needed for moderate pain.  0  . Cholecalciferol (VITAMIN D-3) 5000 units TABS Take 5,000 Units by mouth daily.    . Coenzyme Q10 100 MG capsule Take 100 mg by mouth daily.     Marland Kitchen doxycycline (VIBRA-TABS) 100 MG tablet Take 1 tablet (100 mg total) by mouth 2 (two) times daily. 30 tablet 0  . furosemide (LASIX) 80 MG tablet Take 40-80 mg by mouth See admin instructions. Take 80 mg in the morning and 40 mg in the evening     . JARDIANCE 25 MG TABS tablet Take 1 tablet by mouth daily.    Marland Kitchen losartan (COZAAR) 25 MG tablet Take 1 tablet (25 mg total) by mouth daily. 90 tablet 1  . methocarbamol (ROBAXIN) 500 MG tablet Take 1 tablet (500 mg total) by mouth every 8 (eight) hours as needed for muscle spasms. 60 tablet 1  . metoprolol tartrate (LOPRESSOR) 25 MG tablet Take 12.5 mg by mouth 2 (two) times daily.    . montelukast (SINGULAIR) 10 MG tablet Take 10 mg by mouth at bedtime.    . nitroGLYCERIN (NITROSTAT) 0.4 MG SL tablet Place 1 tablet (0.4 mg total) under the tongue every 5 (five) minutes as needed for chest pain. 25 tablet 5  . omeprazole (PRILOSEC) 20 MG capsule Take 20 mg by mouth daily.    Marland Kitchen oxyCODONE (OXY IR/ROXICODONE) 5 MG immediate release tablet Take 1-2 tablets (5-10 mg total) by mouth every 4 (four) hours as needed for moderate pain (pain score 4-6). 40 tablet 0  . Polyethyl Glycol-Propyl Glycol (SYSTANE OP) Place 1 drop into both eyes daily as needed (for dry eyes).     . potassium chloride SA (KLOR-CON M20) 20 MEQ tablet Take 2 tablets (40 mEq total) by mouth 2 (two) times daily. (Patient taking differently: Take 20-40 mEq by mouth See admin instructions. 2 tabs in the morning, 1 tab in the evening) 360 tablet 2  . psyllium (METAMUCIL) 58.6 % packet Take 1 packet by mouth daily.    . rivaroxaban (XARELTO) 20 MG TABS tablet Take 1 tablet (20 mg total)  by mouth daily with supper. 30 tablet   .  sildenafil (VIAGRA) 100 MG tablet Take 100 mg by mouth as needed for erectile dysfunction.     . Skin Protectants, Misc. (EUCERIN) cream Apply 1 application topically as needed for dry skin.    Marland Kitchen spironolactone (ALDACTONE) 25 MG tablet Take 25 mg by mouth daily.    . tamsulosin (FLOMAX) 0.4 MG CAPS Take 0.4 mg by mouth every evening.     . topiramate (TOPAMAX) 25 MG capsule Take 50 mg by mouth 2 (two) times daily.      No current facility-administered medications for this visit.     Allergies:   Ace inhibitors, Amoxicillin-pot clavulanate, Quinolones, Adhesive [tape], Latex, and Morphine   Social History:  The patient  reports that he quit smoking about 26 years ago. He has a 45.00 pack-year smoking history. He has never used smokeless tobacco. He reports that he does not drink alcohol or use drugs.   Family History:  The patient's family history includes Diabetes in his mother; Emphysema (age of onset: 41) in his father; Heart attack in his sister; Heart disease in his mother; Other in his mother.   ROS:  Please see the history of present illness.   All other systems are personally reviewed and negative.    Exam:    Vital Signs:  BP 115/71   Pulse (!) 59   Ht 5\' 5"  (1.651 Dawson)   Wt 227 lb (103 kg)   SpO2 98%   BMI 37.77 kg/Dawson   Well sounding and appearing, alert and conversant, regular work of breathing,  good skin color Eyes- anicteric, neuro- grossly intact, skin- no apparent rash or lesions or cyanosis, mouth- oral mucosa is pink  Labs/Other Tests and Data Reviewed:    Recent Labs: 10/21/2018: ALT 13; BUN 21; Creatinine, Ser 0.87; Hemoglobin 9.5; Platelets 125; Potassium 4.1; Sodium 137   Wt Readings from Last 3 Encounters:  11/20/18 227 lb (103 kg)  10/30/18 240 lb 6.4 oz (109 kg)  10/18/18 235 lb 0.2 oz (106.6 kg)    Apple watch tracing from yesterday shows sinus with frequent PACs for which the device misinterpreted as afib   ASSESSMENT & PLAN:    1.  Persistent  atrial fibrillation/ atrial flutter Doing very well post ablation off AAD therapy His Apple watch will say "Afib" at times however he thinks he is mostly in sinus chads2vasc score is 4.  He is on xarelto I have reviewed Apple watch strip from yesterday which shows sinus with PACs but is labeled incorrectly afib  2. HTN Stable No change required today  3. Overweight Body mass index is 37.77 kg/Dawson. Lifestyle modification is encouraged He has lost weight with Giardiace  4. OSA Compliant with CPAP  5. Chronic diastolic dyfunction Stable No change required today  Follow-up:  6 months with me   Patient Risk:  after full review of this patients clinical status, I feel that they are at moderate risk at this time.  Today, I have spent 15 minutes with the patient with telehealth technology discussing arrhythmia management .    Army Fossa, Dawson  11/20/2018 9:55 AM     [facility] [address] [place] [address] [phone] (office) [phone] (fax)

## 2018-11-23 NOTE — Progress Notes (Signed)
Cardiology Office Note    Date:  11/26/2018   ID:  DAIR NECESSARY, DOB November 05, 1940, MRN [id number]  PCP:  Josetta Huddle, MD  Cardiologist: Dr. Elisabet Gutzmer [place]   Chief Complaint  Patient presents with  . Atrial Fibrillation  . Coronary Artery Disease    History of Present Illness:    Carlos Dawson is a 78 y.o. male with past medical history of CAD (s/p BMS to 2nd OM in 2012, low-risk NST in 12/2012), PAF (on Xarelto), chronic diastolic CHF, prior PE, HTN, HLD, Type 2 DM, and known thoracic aortic aneurysm who is seen for follow.  He was admitted from 8/23 - 09/15/2016 for evaluation of chest discomfort and palpitations. His initial EKG showed that he was in atrial fibrillation with RVR. He reported good compliance with his Xarelto and denied missing any doses, therefore a successful DCCV was performed in the ED at [place].  He became hypotensive with SBP in the 80's following the procedure, therefore he was admitted overnight for further observation. His Losartan, Spironolactone, and Lasix were held at the time of discharge with plans to resume at follow-up if BP allowed. A CTA was also obtained during admission to rule out a recurrent PE and was negative for a PE but did show a stable 5.0 cm ascending thoracic aortic aneurysm. Antihypertensive therapy was resumed. On 10/20/16 he underwent L1-5 fusion by Dr. Ronnald Ramp.   On follow up in February 2019   he noted increased symptoms of dyspnea on exertion and chest pressure for 3 months. Some improvement with inhalers.  No increase in edema or weight.  Oxygen levels were good. He does use CPAP. Myoview study was mildly abnormal with inferoapical reversible defect. EF 61%. Echo showed normal EF with moderate pulmonary HTN. Subsequent cardiac cath showed no obstructive CAD and no AI. Unable to cross AV due to distortion of the aortic root.  This spring he developed increased symptoms of arrhythmia despite Tikosyn. He underwent EPS by Dr. Rayann Heman on  05/11/17. He had Afib and atrial flutter ablations. When seen by Dr. Rayann Heman on July 29 he had significant improvement in Afib symptoms and some improvement in dyspnea. Multaq was discontinued. Metoprolol was decreased. HR will go up with exercise to 110-130.   On 09/26/17 he had CPX to evaluate his dyspnea. This showed predominant limitation by obesity and deconditioning. He was seen by Dr. Halford Chessman who recommended repeat Echo and if pulmonary pressures still elevated to consider right heart cath. Of note right heart cath in 2016 demonstrated mild pulmonary HTN with elevated PCWP c/w diastolic dysfunction. Diuretic therapy was increased at that time without significant clinical improvement.  In June he was admitted to the hospital at [facility] with intestinal blockage. This was managed medically and with NG tube. This resolved.  He underwent THR in September. Post op course complicated by blood loss anemia. He did develop Afib with rates into the 120s. Since then follow up in the Afib clinic demonstrated return of NSR.   On follow up today he is now walking up to a mile. Breathing is doing well. No chest pain. No significant Afib since DC. Notes a burning in the pit of his stomach. He is on doxycycline for infection in the hip area. He is now on Jardiance. Notes 50 lb weight loss over the past year.     Past Medical History:  Diagnosis Date  . Anticoagulant long-term use   . Aortic root enlargement ([place])   . Ascending aortic  aneurysm [facility])    recent scan in October 2012 showing no change; followed by Dr. Servando Snare  . ASCVD (arteriosclerotic cardiovascular disease)    Prior BMS to the 2nd OM in September 2012; with repeat cath in October showing patency  . CAD (coronary artery disease)    a. s/p BMS to 2nd OM in Sept 2012; b. LexiScan Myoview (12/2012):  Inf infarct; bowel and motion artifact make study difficult to interpret; no ischemia; not gated; Low Risk  . CHF (congestive heart failure) ([place])     no recent issues 10/13/14  . Chronic back pain    "all over my back" (05/11/2017)  . Colonic polyp   . Contact lens/glasses fitting   . Diastolic dysfunction   . DVT (deep venous thrombosis) ([place])    ?LLE  . Frequent headaches    "probably weekly" (05/11/2017)  . Generalized headaches    neck stenosis  . GERD (gastroesophageal reflux disease)   . Hearing loss   . Hearing loss    more so on left  . Hemorrhoids   . History of stomach ulcers   . Hypertension   . IBS (irritable bowel syndrome)   . LVH (left ventricular hypertrophy)   . Mild intermittent asthma   . OA (osteoarthritis)    "all over" (05/11/2017)  . Obesities, morbid ([place])   . OSA (obstructive sleep apnea)    PSG 03/30/97 AHI 21, BPAP 13/9  . OSA on CPAP   . PAF (paroxysmal atrial fibrillation) ([place])    a. on Xarelto b. s/p DCCV in 08/2016; b. Tikosyn failed 04/16/17 with plans for Multaq and possible Afib ablation with Dr. Rayann Heman  . Pneumonia    'several times" (05/11/2017)  . Prostate CA [facility])    Oncologist  DR. Daralene Milch [facility] dx 09/24/14, undetermined tx   prostate; S/P "radiation and hormone injections"  . Pulmonary embolism ([place]) 2008   "both lungs"  . SOB (shortness of breath)    on excertion  . Thoracic aortic aneurysm (HCC)    Aortic Size Index=     5.0    /Body surface area is 2.43 meters squared. = 2.05  < 2.75 cm/m2      4% risk per year 2.75 to 4.25          8% risk per year > 4.25 cm/m2    20% risk per year   Stable aneurysmal dilation of the ascending aorta with maximum AP diameter of 4.8 cm. Stable area of narrowing of the proximal most portion of the descending aorta measuring 2 cm., previously identified as an area of coarctation. No evidence of aortic dissection.  Coronary artery disease.  Normal appearance of the lungs.   Electronically Signed   By: Fidela Salisbury M.D.   On: 10/01/2014 08:50    . Type II diabetes mellitus ([place])    metphormin, average 154 dx 2017    Past Surgical History:   Procedure Laterality Date  . ACHILLES TENDON REPAIR Bilateral   . AORTIC ARCH ANGIOGRAPHY N/A 03/13/2017   Procedure: AORTIC ARCH ANGIOGRAPHY;  Surgeon: Martin[place], Marda Breidenbach M, MD;  Location: [place] CV LAB;  Service: Cardiovascular;  Laterality: N/A;  . APPENDECTOMY    . ATRIAL FIBRILLATION ABLATION  05/11/2017  . ATRIAL FIBRILLATION ABLATION N/A 05/11/2017   Procedure: ATRIAL FIBRILLATION ABLATION;  Surgeon: [name], MD;  Location: [place] CV LAB;  Service: Cardiovascular;  Laterality: N/A;  . BACK SURGERY     "I've had 7 back and 1 neck  ORs" (05/11/2017)  . BIOPSY  03/14/2018   Procedure: BIOPSY;  Surgeon: Ronnette Juniper, MD;  Location: [facility];  Service: Gastroenterology;;  EGD and Colon  . CARDIAC CATHETERIZATION  2006  . CARPAL TUNNEL RELEASE Bilateral    LEFT  . CATARACT EXTRACTION W/ INTRAOCULAR LENS  IMPLANT, BILATERAL Bilateral   . CERVICAL SPINE SURGERY  06/02/2010   lower back and neck  . COLONOSCOPY N/A 03/14/2018   Procedure: COLONOSCOPY;  Surgeon: Ronnette Juniper, MD;  [facility]: [facility];  Service: Gastroenterology;  Laterality: N/A;  . COLONOSCOPY WITH PROPOFOL N/A 12/29/2014   Procedure: COLONOSCOPY WITH PROPOFOL;  Surgeon: Garlan Fair, MD;  Location: [facility];  Service: Endoscopy;  Laterality: N/A;  . CORONARY ANGIOPLASTY WITH STENT PLACEMENT  October 2012  . CORONARY STENT PLACEMENT  Sept 2012   2nd OM with BMS  . ESOPHAGOGASTRODUODENOSCOPY N/A 03/14/2018   Procedure: ESOPHAGOGASTRODUODENOSCOPY (EGD);  Surgeon: Ronnette Juniper, MD;  Location: [facility];  Service: Gastroenterology;  Laterality: N/A;  . HEMORROIDECTOMY    . LAMINECTOMY  05/30/2012   L 4 L5  . LAMINECTOMY WITH POSTERIOR LATERAL ARTHRODESIS LEVEL 3 N/A 10/18/2016   Procedure: Posterior Lateral Fusion - Lumbar One-Four, segmental instrumentation Lumbar One-Five,  decompression,;  Surgeon: Eustace Moore, MD;  Location: [facility] OR;  Service: Neurosurgery;  Laterality: N/A;  . LAPAROSCOPIC  CHOLECYSTECTOMY    . LAPAROSCOPIC GASTRIC BANDING    . LEFT AND RIGHT HEART CATHETERIZATION WITH CORONARY ANGIOGRAM N/A 05/07/2014   Procedure: LEFT AND RIGHT HEART CATHETERIZATION WITH CORONARY ANGIOGRAM;  Surgeon: Shaday Rayborn M Martinique, MD;  Location: [facility] CATH LAB;  Service: Cardiovascular;  Laterality: N/A;  . LEFT HEART CATH AND CORONARY ANGIOGRAPHY N/A 03/13/2017   Procedure: LEFT HEART CATH AND CORONARY ANGIOGRAPHY;  Surgeon: Martinique, Sanae Willetts M, MD;  Location: [place] CV LAB;  Service: Cardiovascular;  Laterality: N/A;  . LUMBAR LAMINECTOMY/DECOMPRESSION MICRODISCECTOMY N/A 05/04/2016   Procedure: Laminectomy and Foraminotomy - Thoracic twelve-Lumbar one -Posterior Fusion Lumbar one-two;  Surgeon: Eustace Moore, MD;  Location: [place];  Service: Neurosurgery;  Laterality: N/A;  . POLYPECTOMY  03/14/2018   Procedure: POLYPECTOMY;  Surgeon: Ronnette Juniper, MD;  [facility];  Service: Gastroenterology;;  . POSTERIOR LUMBAR FUSION  10/18/2016  . SHOULDER OPEN ROTATOR CUFF REPAIR Bilateral   . TONSILLECTOMY AND ADENOIDECTOMY    . TOTAL HIP ARTHROPLASTY Right 10/18/2018   Procedure: RIGHT TOTAL HIP ARTHROPLASTY ANTERIOR APPROACH;  Surgeon: Mcarthur Rossetti, MD;  [facility]: [facility] ORS;  Service: Orthopedics;  Laterality: Right;  . TRIGGER FINGER RELEASE     LEFT  . UVULOPALATOPHARYNGOPLASTY    . VASECTOMY      Current Medications: Outpatient Medications Prior to Visit  Medication Sig Dispense Refill  . acetaminophen (TYLENOL) 500 MG tablet Take 1,000 mg by mouth every 8 (eight) hours as needed for mild pain or headache.     . albuterol (PROVENTIL HFA;VENTOLIN HFA) 108 (90 Base) MCG/ACT inhaler Inhale 2 puffs into the lungs every 4 (four) hours as needed for wheezing or shortness of breath.    Marland Kitchen atorvastatin (LIPITOR) 10 MG tablet Take 5 mg by mouth daily at 6 PM.     . augmented betamethasone dipropionate (DIPROLENE-AF) 0.05 % cream Apply 1 application topically 2 (two) times daily as needed  for itching.  3  . budesonide-formoterol (SYMBICORT) 80-4.5 MCG/ACT inhaler Inhale 2 puffs into the lungs 2 (two) times daily as needed (for shortness of breath).     . celecoxib (CELEBREX) 200 MG capsule Take  200 mg by mouth 2 (two) times daily as needed for moderate pain.  0  . Cholecalciferol (VITAMIN D-3) 5000 units TABS Take 5,000 Units by mouth daily.    . Coenzyme Q10 100 MG capsule Take 100 mg by mouth daily.     . furosemide (LASIX) 80 MG tablet Take 80 mg by mouth daily. Take 80 mg in the morning     . JARDIANCE 25 MG TABS tablet Take 1 tablet by mouth daily.    Marland Kitchen losartan (COZAAR) 25 MG tablet Take 1 tablet (25 mg total) by mouth daily. 90 tablet 1  . methocarbamol (ROBAXIN) 500 MG tablet Take 1 tablet (500 mg total) by mouth every 8 (eight) hours as needed for muscle spasms. 60 tablet 1  . metoprolol tartrate (LOPRESSOR) 25 MG tablet Take 12.5 mg by mouth 2 (two) times daily.    . montelukast (SINGULAIR) 10 MG tablet Take 10 mg by mouth at bedtime.    . nitroGLYCERIN (NITROSTAT) 0.4 MG SL tablet Place 1 tablet (0.4 mg total) under the tongue every 5 (five) minutes as needed for chest pain. 25 tablet 5  . omeprazole (PRILOSEC) 20 MG capsule Take 20 mg by mouth daily.    Vladimir Faster Glycol-Propyl Glycol (SYSTANE OP) Place 1 drop into both eyes daily as needed (for dry eyes).     . potassium chloride SA (KLOR-CON M20) 20 MEQ tablet Take 2 tablets (40 mEq total) by mouth 2 (two) times daily. (Patient taking differently: Take 20-40 mEq by mouth See admin instructions. 2 tabs in the morning, 1 tab in the evening) 360 tablet 2  . psyllium (METAMUCIL) 58.6 % packet Take 1 packet by mouth daily.    . rivaroxaban (XARELTO) 20 MG TABS tablet Take 1 tablet (20 mg total) by mouth daily with supper. 30 tablet   . sildenafil (VIAGRA) 100 MG tablet Take 100 mg by mouth as needed for erectile dysfunction.     . Skin Protectants, Misc. (EUCERIN) cream Apply 1 application topically as needed for dry skin.     Marland Kitchen spironolactone (ALDACTONE) 25 MG tablet Take 25 mg by mouth daily.    . tamsulosin (FLOMAX) 0.4 MG CAPS Take 0.4 mg by mouth every evening.     . topiramate (TOPAMAX) 25 MG capsule Take 50 mg by mouth 2 (two) times daily.     Marland Kitchen doxycycline (VIBRA-TABS) 100 MG tablet Take 1 tablet (100 mg total) by mouth 2 (two) times daily. 30 tablet 0  . oxyCODONE (OXY IR/ROXICODONE) 5 MG immediate release tablet Take 1-2 tablets (5-10 mg total) by mouth every 4 (four) hours as needed for moderate pain (pain score 4-6). 40 tablet 0   No facility-administered medications prior to visit.      Allergies:   Ace inhibitors, Amoxicillin-pot clavulanate, Quinolones, Adhesive [tape], Latex, and Morphine   Social History   Socioeconomic History  . Marital status: Married    Spouse name: Not on file  . Number of children: 3  . Years of education: Not on file  . Highest education level: Not on file  Occupational History  . Occupation: Retired from Scientist, clinical (histocompatibility and immunogenetics): RETIRED  Social Needs  . Financial resource strain: Not on file  . Food insecurity    Worry: Not on file    Inability: Not on file  . Transportation needs    Medical: Not on file    Non-medical: Not on file  Tobacco Use  . Smoking status: Former Smoker  Packs/day: 1.50    Years: 30.00    Pack years: 78.00    Quit date: 01/24/1992    Years since quitting: 26.8  . Smokeless tobacco: Never Used  Substance and Sexual Activity  . Alcohol use: No  . Drug use: No  . Sexual activity: Not Currently  Lifestyle  . Physical activity    Days per week: Not on file    Minutes per session: Not on file  . Stress: Not on file  Relationships  . Social Herbalist on phone: Not on file    Gets together: Not on file    Attends religious service: Not on file    Active member of club or organization: Not on file    Attends meetings of clubs or organizations: Not on file    Relationship status: Not on file  Other Topics Concern  . Not  on file  Social History Narrative  . Not on file     Family History:  The patient's family history includes Diabetes in his mother; Emphysema (age of onset: 35) in his father; Heart attack in his sister; Heart disease in his mother; Other in his mother.   Review of Systems:   Please see the history of present illness.    All other systems reviewed and are otherwise negative except as noted above.   Physical Exam:    VS:  BP 105/66   Pulse 95   Ht 5\' 5"  (1.651 m)   Wt 232 lb (105.2 kg)   SpO2 96%   BMI 38.61 kg/m    GENERAL:  Well appearing,  obese WM in NAD HEENT:  PERRL, EOMI, sclera are clear. Oropharynx is clear. NECK:  No jugular venous distention, carotid upstroke brisk and symmetric, no bruits, no thyromegaly or adenopathy LUNGS:  Clear to auscultation bilaterally CHEST:  Unremarkable HEART:  RRR,  PMI not displaced or sustained,S1 and S2 within normal limits, no S3, no S4: no clicks, no rubs, no murmurs ABD:  Soft, nontender. BS +, no masses or bruits. No hepatomegaly, no splenomegaly EXT:  2 + pulses throughout, no edema, no cyanosis no clubbing SKIN:  Warm and dry.  No rashes NEURO:  Alert and oriented x 3. Cranial nerves II through XII intact. PSYCH:  Cognitively intact        Wt Readings from Last 3 Encounters:  11/26/18 232 lb (105.2 kg)  11/20/18 227 lb (103 kg)  10/30/18 240 lb 6.4 oz (109 kg)      Studies/Labs Reviewed:   EKG:  EKG is not ordered today.   Recent Labs: 10/21/2018: ALT 13; BUN 21; Creatinine, Ser 0.87; Hemoglobin 9.5; Platelets 125; Potassium 4.1; Sodium 137   Lipid Panel    Component Value Date/Time   CHOL 118 01/07/2013 0410   TRIG 133 01/07/2013 0410   HDL 40 01/07/2013 0410   CHOLHDL 3.0 01/07/2013 0410   VLDL 27 01/07/2013 0410   LDLCALC 51 01/07/2013 0410   Labs dated 07/11/16: cholesterol 114, triglycerides 80, HDL 46, LDL 52. A1c 7.1%.  Dated 01/24/17: cholesterol 110, triglycerides 50, HDL 52, LDL 48. A1c 7.8%.  Dated  07/16/17: cholesterol 106, triglycerides 72, HDL 50, LDL 41. Creatinine 1.36. Hgb 12.8. Other chemistries and TSH normal.  Dated 10/10/18: cholesterol 100, triglycerides 54, HDL 51, LDL 38.    Additional studies/ records that were reviewed today include:   Myoview 03/06/17: Study Highlights    The left ventricular ejection fraction is normal (55-65%).  Nuclear stress  EF: 61%.  There is a small defect of mild severity present in the basal inferior, mid inferior and apex location. The defect is partially reversible. Overall image quality is poor due to soft tissue attenuation. Cannot rule out a small area of ischemia.  There is evidence of transient ischemic dilatation with a TID of 1.27.  This is an intermediate risk study.  There was no ST segment deviation noted during stress    Echo 03/07/17: Study Conclusions  - Left ventricle: The cavity size was normal. Wall thickness was   increased in a pattern of mild LVH. Systolic function was normal.   The estimated ejection fraction was in the range of 55% to 60%.   Wall motion was normal; there were no regional wall motion   abnormalities. Left ventricular diastolic function parameters   were normal. - Aortic valve: There was very mild stenosis. Valve area (VTI):   1.81 cm^2. Valve area (Vmax): 1.94 cm^2. Valve area (Vmean): 1.85   cm^2. - Right atrium: The atrium was mildly dilated. - Atrial septum: No defect or patent foramen ovale was identified. - Pulmonary arteries: PA peak pressure: 54 mm Hg (S).  Cardiac cath 03/15/17: Conclusion     Previously placed Ost 2nd Mrg to 2nd Mrg stent (unknown type) is widely patent.  There is no aortic valve regurgitation.   1. No significant obstructive CAD 2. Aneurysmal thoracic aorta.   Plan: continue medical management . Consider pulmonary evaluation for symptoms of dyspnea.      Referred for: Dyspnea on exertion   Procedure: This patient underwent staged symptom-limited  exercise testing using an individualized bike protocol with expired gas analysis metabolic evaluation during exercise.  Demographics  Age: 68 Ht. (in.) 67.0 Wt. (lb) 272.4 BMI: 42.7   Predicted Peak VO2: 16.0  Gender: Male Ht (cm) 170.2 Wt. (kg) 123.6    Results  Pre-Exercise PFTs   FVC 2.01 (54%)     FEV1 1.21 (43%)      FEV1/FVC 60      MVV 68 (60%)      Exercise Time:  7:00   Watts: 60  RPE: 15  Reason stopped: patient ended test due to dyspnea (9/10)  Additional symptoms: chest tightness (6/10) and lightheaded (5/10)  Resting HR: 55 Peak HR: 107  (49% age predicted max HR)  BP rest: 122/70 BP peak: 170/82  Peak VO2: 11.4 (71% predicted peak VO2)  VE/VCO2 slope: 40  OUES: 1.82  Peak RER: 0.96  Ventilatory Threshold: 8.7 (54% predicted and 76% measured peak VO2)  Peak RR 36  Peak Ventilation: 52.2  VE/MVV: 77%  PETCO2 at peak: 30  O2pulse: 13  (93% predicted O2pulse)   Interpretation  Notes: Patient gave a good effort although he was dyspneic. Pulse-oximetry at rest 97% with a low of 93% at peak exercise. Exercise was performed on a cycle ergometer starting at [facility] and increasing by 10W/min.   ECG:  Resting ECG in sinus bradycardia with 1st degree AV block and incomplete right bundle branch block. HR response blunted. There were frequent PVCs at rest with suppression during exercise with no sustained arrhythmias and no ST-T changes. BP response appropriate.   PFT: Pre-exercise spirometry demonstrates severe obstruction. MVV below normal range.   CPX: Exercise Capacity- Exercise testing with gas exchange demonstrates a mildly reduced peak VO2 of 11.4 ml/kg/min (71% of the age/gender/weight matched sedentary norms). The RER of 0.96 indicates a submaximal effort. When adjusted to the patient's ideal body weight of 162.6 lb (  73.7 kg) the peak VO2 is 16.0 ml/kg (ibw)/min (70% of the ibw-adjusted predicted).   Cardiovascular  response- The O2pulse (a surrogate for strokevolume) increased with incremental exercise reaching peak of 13 ml/beat (93% predicted). DeltaVO2/Delta WR is 8.4 Indicating no clear evidence of cardiovascular impairment.  Ventilatory response- The VE/VCO2 slope is elevated and indicates Increased dead space ventilation. The oxygen uptake efficiency slope (OUES) is 1.82. The VO2 at the ventilatory threshold was normal at 54% of the predicted peak VO2 and 76% measured PVO2. At peak exercise, the ventilation reached 54% of the measured MVV and breathing reserve was 16 indicating ventilatory reserve was nearly depleted. PETCO2 was normal at 73mmHg during exercise.   Conclusion: The interpretation of this test is limited due to submaximal effort during the exercise. Based on available data, exercise testing with gas exchange demonstrates mild functional impairment when compared to matched sedentary norms. Suspect dyspnea is multifactorial, there is evidence of effect of deconditioning and primary limitation due to body habitus.   In addition he had inadequate heart rate response to exercise and a state of hyperventilation at peak exercise, which induced an elevated VE/VCO2 slope. He approached depletion of ventilatory reserve which may be due to his obstructive lung disease.  Test, report and preliminary impression by: Landis Martins, MS, ACSM-RCEP 09/26/2017 3:03 PM  FINALIZED Marshell Garfinkel MD [facility] 10/08/2017, 10:04 AM  Assessment:    1. Paroxysmal atrial fibrillation (HCC)   2. Chronic diastolic heart failure ([place])   3. Obstructive sleep apnea   4. Pulmonary HTN ([place])   5. Coronary artery disease involving native coronary artery of native heart without angina pectoris      Plan:   In order of problems listed above:  1. Paroxysmal Atrial Fibrillation/ Long-term Anticoagulation - s/p DCCV in August 2018.  - s/p Afib and Aflutter ablation in April 2019. Now off  antiarrhythmic therapy. - some breakthrough after surgery but doing well now.  - This patients CHA2DS2-VASc Score and unadjusted Ischemic Stroke Rate (% per year) is equal to 9.7 % stroke rate/year from a score of 6 (HTN, DM, Vascular, Age, TE (2)). He denies any evidence of active bleeding. Continue Xarelto for anticoagulation.   2. CAD - s/p BMS to 2nd OM in 2012, cardiac cath Feb 2019 showed continued patency with nonobstructive disease. - Continue  statin therapy. No ASA secondary to need for Xarelto.  3. Chronic diastolic CHF - echo in [id number] showed a preserved EF of 55-60%. He has no significant increase in  edema on examination and weight is down.  -. Note right heart findings in 2016.  - with the addition of Jardiance I would like to reduce his lasix to 80 mg once a day.  4. HTN - BP is well-controlled  - continue  Current therapy  5. HLD -  LDL 38.  - continue Atorvastatin 10mg  daily.   6. Thoracic aortic aneurysm  - stable at 5.0 cm by last CT Scan. - followed by Dr. Servando Snare with CT Surgery.   7. Chronic respiratory failure due to morbid obesity and OSA. Followed by pulmonary. - recent CPX suggests major limitation is deconditioning and body habitus. Improved with weight loss.   8. Pulmonary HTN secondary to #3 and #7.  Cardiac cath in 2016 showed mild pulmonary venous HTN. Last Echo in Sept 2019 showed normal pulmonary pressure.       Signed, Kindra Bickham [place], [place] 11/26/2018 4:05 PM    [facility]  [address], [place], [place] [address] Phone: [phone]

## 2018-11-26 ENCOUNTER — Other Ambulatory Visit: Payer: Self-pay

## 2018-11-26 ENCOUNTER — Encounter: Payer: Self-pay | Admitting: Orthopaedic Surgery

## 2018-11-26 ENCOUNTER — Encounter: Payer: Self-pay | Admitting: Cardiology

## 2018-11-26 ENCOUNTER — Ambulatory Visit (INDEPENDENT_AMBULATORY_CARE_PROVIDER_SITE_OTHER): Payer: Medicare Other | Admitting: Cardiology

## 2018-11-26 ENCOUNTER — Ambulatory Visit (INDEPENDENT_AMBULATORY_CARE_PROVIDER_SITE_OTHER): Payer: Medicare Other | Admitting: Orthopaedic Surgery

## 2018-11-26 VITALS — BP 105/66 | HR 95 | Ht 65.0 in | Wt 232.0 lb

## 2018-11-26 DIAGNOSIS — Z23 Encounter for immunization: Secondary | ICD-10-CM | POA: Diagnosis not present

## 2018-11-26 DIAGNOSIS — Z96641 Presence of right artificial hip joint: Secondary | ICD-10-CM

## 2018-11-26 DIAGNOSIS — I5032 Chronic diastolic (congestive) heart failure: Secondary | ICD-10-CM

## 2018-11-26 DIAGNOSIS — I272 Pulmonary hypertension, unspecified: Secondary | ICD-10-CM | POA: Diagnosis not present

## 2018-11-26 DIAGNOSIS — I2510 Atherosclerotic heart disease of native coronary artery without angina pectoris: Secondary | ICD-10-CM

## 2018-11-26 DIAGNOSIS — I480 Paroxysmal atrial fibrillation: Secondary | ICD-10-CM

## 2018-11-26 DIAGNOSIS — G4733 Obstructive sleep apnea (adult) (pediatric): Secondary | ICD-10-CM | POA: Diagnosis not present

## 2018-11-26 NOTE — Progress Notes (Signed)
HPI: Mr. Bentley returns today follow-up of his right total hip arthroplasty mainly for wound check.  He states that she still some drainage he still has some soreness he is using a cane.  Is washing the wound with Dial soap and applying a small amount of Bactroban.  He does report that he feels some discomfort where he feels the end of the stem would come to but otherwise is trending towards improvement.  Review of systems: No fevers, chills.  Physical exam: Right hip good range of motion without pain.  Surgical incision small areas of dehiscence proximal and distal portion of the incision.  No expressible drainage.  Band-Aids did have small amount of serosanguineous type fluid on them.  Ambulates with a cane.  Able to get on and off the exam table on his own.   Impression: Status post right total hip arthroplasty 10/18/2018  Plan: He will continue current wound care.  We will see him in a month check his incision.  Follow-up with [place] sooner if there is any questions concerns.

## 2018-11-26 NOTE — Patient Instructions (Signed)
Reduce lasix to 80 mg daily  Continue your other medication

## 2018-12-11 DIAGNOSIS — N400 Enlarged prostate without lower urinary tract symptoms: Secondary | ICD-10-CM | POA: Diagnosis not present

## 2018-12-11 DIAGNOSIS — C61 Malignant neoplasm of prostate: Secondary | ICD-10-CM | POA: Diagnosis not present

## 2018-12-11 DIAGNOSIS — E785 Hyperlipidemia, unspecified: Secondary | ICD-10-CM | POA: Diagnosis not present

## 2018-12-11 DIAGNOSIS — D649 Anemia, unspecified: Secondary | ICD-10-CM | POA: Diagnosis not present

## 2018-12-11 DIAGNOSIS — I480 Paroxysmal atrial fibrillation: Secondary | ICD-10-CM | POA: Diagnosis not present

## 2018-12-11 DIAGNOSIS — I2510 Atherosclerotic heart disease of native coronary artery without angina pectoris: Secondary | ICD-10-CM | POA: Diagnosis not present

## 2018-12-11 DIAGNOSIS — E119 Type 2 diabetes mellitus without complications: Secondary | ICD-10-CM | POA: Diagnosis not present

## 2018-12-11 DIAGNOSIS — I4891 Unspecified atrial fibrillation: Secondary | ICD-10-CM | POA: Diagnosis not present

## 2018-12-11 DIAGNOSIS — D509 Iron deficiency anemia, unspecified: Secondary | ICD-10-CM | POA: Diagnosis not present

## 2018-12-11 DIAGNOSIS — I10 Essential (primary) hypertension: Secondary | ICD-10-CM | POA: Diagnosis not present

## 2018-12-11 DIAGNOSIS — N1831 Chronic kidney disease, stage 3a: Secondary | ICD-10-CM | POA: Diagnosis not present

## 2018-12-11 DIAGNOSIS — J45909 Unspecified asthma, uncomplicated: Secondary | ICD-10-CM | POA: Diagnosis not present

## 2018-12-26 ENCOUNTER — Encounter: Payer: Self-pay | Admitting: Orthopaedic Surgery

## 2018-12-26 ENCOUNTER — Other Ambulatory Visit: Payer: Self-pay

## 2018-12-26 ENCOUNTER — Ambulatory Visit (INDEPENDENT_AMBULATORY_CARE_PROVIDER_SITE_OTHER): Payer: Medicare Other | Admitting: Orthopaedic Surgery

## 2018-12-26 DIAGNOSIS — Z96641 Presence of right artificial hip joint: Secondary | ICD-10-CM

## 2018-12-26 NOTE — Progress Notes (Signed)
The patient is a very pleasant 78 year old gentleman who is just over 2 months status post a right total hip arthroplasty.  He says the right hip is doing well and he has good strength and range of motion.  He is walking without assistive device.  He has been having some low back pain.  He does have chronic back issues.  He reports that he has had at least 8 back operations.  On exam both hips move smoothly.  His right operative hip has no issues on my exam at all.  His leg lengths are equal.  Hopefully with time he will be more balanced walking with his hips and this can take pressure off the spine.  From my standpoint he understands that I do not need to see him back until 6 months from now.  At that visit I like a standing low pelvis and lateral of his right operative hip.  If there are issues before then he will let us know.  All question concerns were answered and addressed.

## 2019-01-15 ENCOUNTER — Telehealth: Payer: Self-pay | Admitting: *Deleted

## 2019-01-15 NOTE — Care Plan (Signed)
RNCM call to patient to check status at 90 days post-op. Patient reports he is doing very well with regards to the hip, but is having some back issues unrelated. Cyndee Brightly. Survey completed. Reminded to contact office or CM with any questions or concerns. No other concerns at the moment.

## 2019-01-15 NOTE — Telephone Encounter (Signed)
90 day Ortho bundle call completed. 

## 2019-01-21 DIAGNOSIS — E119 Type 2 diabetes mellitus without complications: Secondary | ICD-10-CM | POA: Diagnosis not present

## 2019-01-21 DIAGNOSIS — N1831 Chronic kidney disease, stage 3a: Secondary | ICD-10-CM | POA: Diagnosis not present

## 2019-01-21 DIAGNOSIS — J45909 Unspecified asthma, uncomplicated: Secondary | ICD-10-CM | POA: Diagnosis not present

## 2019-01-21 DIAGNOSIS — E785 Hyperlipidemia, unspecified: Secondary | ICD-10-CM | POA: Diagnosis not present

## 2019-01-21 DIAGNOSIS — I10 Essential (primary) hypertension: Secondary | ICD-10-CM | POA: Diagnosis not present

## 2019-01-21 DIAGNOSIS — C61 Malignant neoplasm of prostate: Secondary | ICD-10-CM | POA: Diagnosis not present

## 2019-01-21 DIAGNOSIS — I5030 Unspecified diastolic (congestive) heart failure: Secondary | ICD-10-CM | POA: Diagnosis not present

## 2019-01-21 DIAGNOSIS — I509 Heart failure, unspecified: Secondary | ICD-10-CM | POA: Diagnosis not present

## 2019-01-21 DIAGNOSIS — I480 Paroxysmal atrial fibrillation: Secondary | ICD-10-CM | POA: Diagnosis not present

## 2019-01-21 DIAGNOSIS — I2510 Atherosclerotic heart disease of native coronary artery without angina pectoris: Secondary | ICD-10-CM | POA: Diagnosis not present

## 2019-01-21 DIAGNOSIS — J4530 Mild persistent asthma, uncomplicated: Secondary | ICD-10-CM | POA: Diagnosis not present

## 2019-01-21 DIAGNOSIS — I4891 Unspecified atrial fibrillation: Secondary | ICD-10-CM | POA: Diagnosis not present

## 2019-02-06 ENCOUNTER — Other Ambulatory Visit: Payer: Self-pay | Admitting: Cardiology

## 2019-02-14 DIAGNOSIS — E785 Hyperlipidemia, unspecified: Secondary | ICD-10-CM | POA: Diagnosis not present

## 2019-02-14 DIAGNOSIS — E119 Type 2 diabetes mellitus without complications: Secondary | ICD-10-CM | POA: Diagnosis not present

## 2019-02-14 DIAGNOSIS — N1831 Chronic kidney disease, stage 3a: Secondary | ICD-10-CM | POA: Diagnosis not present

## 2019-02-14 DIAGNOSIS — N400 Enlarged prostate without lower urinary tract symptoms: Secondary | ICD-10-CM | POA: Diagnosis not present

## 2019-02-14 DIAGNOSIS — D509 Iron deficiency anemia, unspecified: Secondary | ICD-10-CM | POA: Diagnosis not present

## 2019-02-14 DIAGNOSIS — I4891 Unspecified atrial fibrillation: Secondary | ICD-10-CM | POA: Diagnosis not present

## 2019-02-14 DIAGNOSIS — J45909 Unspecified asthma, uncomplicated: Secondary | ICD-10-CM | POA: Diagnosis not present

## 2019-02-14 DIAGNOSIS — I2510 Atherosclerotic heart disease of native coronary artery without angina pectoris: Secondary | ICD-10-CM | POA: Diagnosis not present

## 2019-02-14 DIAGNOSIS — I10 Essential (primary) hypertension: Secondary | ICD-10-CM | POA: Diagnosis not present

## 2019-02-14 DIAGNOSIS — I480 Paroxysmal atrial fibrillation: Secondary | ICD-10-CM | POA: Diagnosis not present

## 2019-02-14 DIAGNOSIS — D649 Anemia, unspecified: Secondary | ICD-10-CM | POA: Diagnosis not present

## 2019-02-14 DIAGNOSIS — C61 Malignant neoplasm of prostate: Secondary | ICD-10-CM | POA: Diagnosis not present

## 2019-02-24 ENCOUNTER — Other Ambulatory Visit: Payer: Self-pay | Admitting: *Deleted

## 2019-02-24 DIAGNOSIS — I712 Thoracic aortic aneurysm, without rupture, unspecified: Secondary | ICD-10-CM

## 2019-03-14 DIAGNOSIS — E785 Hyperlipidemia, unspecified: Secondary | ICD-10-CM | POA: Diagnosis not present

## 2019-03-14 DIAGNOSIS — N183 Chronic kidney disease, stage 3 unspecified: Secondary | ICD-10-CM | POA: Diagnosis not present

## 2019-03-14 DIAGNOSIS — D509 Iron deficiency anemia, unspecified: Secondary | ICD-10-CM | POA: Diagnosis not present

## 2019-03-14 DIAGNOSIS — D649 Anemia, unspecified: Secondary | ICD-10-CM | POA: Diagnosis not present

## 2019-03-14 DIAGNOSIS — E119 Type 2 diabetes mellitus without complications: Secondary | ICD-10-CM | POA: Diagnosis not present

## 2019-03-14 DIAGNOSIS — I4891 Unspecified atrial fibrillation: Secondary | ICD-10-CM | POA: Diagnosis not present

## 2019-03-14 DIAGNOSIS — I10 Essential (primary) hypertension: Secondary | ICD-10-CM | POA: Diagnosis not present

## 2019-03-14 DIAGNOSIS — J4530 Mild persistent asthma, uncomplicated: Secondary | ICD-10-CM | POA: Diagnosis not present

## 2019-03-14 DIAGNOSIS — C61 Malignant neoplasm of prostate: Secondary | ICD-10-CM | POA: Diagnosis not present

## 2019-03-14 DIAGNOSIS — I2510 Atherosclerotic heart disease of native coronary artery without angina pectoris: Secondary | ICD-10-CM | POA: Diagnosis not present

## 2019-03-14 DIAGNOSIS — I509 Heart failure, unspecified: Secondary | ICD-10-CM | POA: Diagnosis not present

## 2019-04-18 DIAGNOSIS — R972 Elevated prostate specific antigen [PSA]: Secondary | ICD-10-CM | POA: Diagnosis not present

## 2019-04-21 DIAGNOSIS — I509 Heart failure, unspecified: Secondary | ICD-10-CM | POA: Diagnosis not present

## 2019-04-21 DIAGNOSIS — Z08 Encounter for follow-up examination after completed treatment for malignant neoplasm: Secondary | ICD-10-CM | POA: Diagnosis not present

## 2019-04-21 DIAGNOSIS — I10 Essential (primary) hypertension: Secondary | ICD-10-CM | POA: Diagnosis not present

## 2019-04-21 DIAGNOSIS — R197 Diarrhea, unspecified: Secondary | ICD-10-CM | POA: Diagnosis not present

## 2019-04-21 DIAGNOSIS — E785 Hyperlipidemia, unspecified: Secondary | ICD-10-CM | POA: Diagnosis not present

## 2019-04-21 DIAGNOSIS — Z8546 Personal history of malignant neoplasm of prostate: Secondary | ICD-10-CM | POA: Diagnosis not present

## 2019-04-21 DIAGNOSIS — D649 Anemia, unspecified: Secondary | ICD-10-CM | POA: Diagnosis not present

## 2019-04-21 DIAGNOSIS — D509 Iron deficiency anemia, unspecified: Secondary | ICD-10-CM | POA: Diagnosis not present

## 2019-04-21 DIAGNOSIS — I2510 Atherosclerotic heart disease of native coronary artery without angina pectoris: Secondary | ICD-10-CM | POA: Diagnosis not present

## 2019-04-21 DIAGNOSIS — I4891 Unspecified atrial fibrillation: Secondary | ICD-10-CM | POA: Diagnosis not present

## 2019-04-21 DIAGNOSIS — R152 Fecal urgency: Secondary | ICD-10-CM | POA: Diagnosis not present

## 2019-04-21 DIAGNOSIS — N183 Chronic kidney disease, stage 3 unspecified: Secondary | ICD-10-CM | POA: Diagnosis not present

## 2019-04-21 DIAGNOSIS — C61 Malignant neoplasm of prostate: Secondary | ICD-10-CM | POA: Diagnosis not present

## 2019-04-21 DIAGNOSIS — Z87891 Personal history of nicotine dependence: Secondary | ICD-10-CM | POA: Diagnosis not present

## 2019-04-21 DIAGNOSIS — M549 Dorsalgia, unspecified: Secondary | ICD-10-CM | POA: Diagnosis not present

## 2019-04-21 DIAGNOSIS — E119 Type 2 diabetes mellitus without complications: Secondary | ICD-10-CM | POA: Diagnosis not present

## 2019-04-21 DIAGNOSIS — N400 Enlarged prostate without lower urinary tract symptoms: Secondary | ICD-10-CM | POA: Diagnosis not present

## 2019-04-24 ENCOUNTER — Ambulatory Visit (INDEPENDENT_AMBULATORY_CARE_PROVIDER_SITE_OTHER): Payer: Medicare Other | Admitting: Cardiothoracic Surgery

## 2019-04-24 ENCOUNTER — Other Ambulatory Visit: Payer: Self-pay

## 2019-04-24 ENCOUNTER — Encounter: Payer: Self-pay | Admitting: Cardiothoracic Surgery

## 2019-04-24 ENCOUNTER — Ambulatory Visit
Admission: RE | Admit: 2019-04-24 | Discharge: 2019-04-24 | Disposition: A | Discharge status: Home / Self Care | Payer: Medicare Other | Source: Ambulatory Visit | Attending: Cardiothoracic Surgery | Admitting: Cardiothoracic Surgery

## 2019-04-24 VITALS — BP 99/63 | HR 74 | Temp 97.7°F | Resp 16 | Ht 65.0 in | Wt 238.0 lb

## 2019-04-24 DIAGNOSIS — J9811 Atelectasis: Secondary | ICD-10-CM | POA: Diagnosis not present

## 2019-04-24 DIAGNOSIS — I712 Thoracic aortic aneurysm, without rupture, unspecified: Secondary | ICD-10-CM

## 2019-04-24 MED ORDER — IOPAMIDOL (ISOVUE-370) INJECTION 76%
75.0000 mL | Freq: Once | INTRAVENOUS | Status: AC | PRN
  Administered 2019-04-24: 75 mL via INTRAVENOUS

## 2019-04-24 NOTE — Progress Notes (Signed)
[address]       [place] [id number]             [phone]                   Pervis C Tarrant [facility] Medical Record [id number] Date of Birth: 08-18-1940  Referring: Martinique, [name], MD Primary Care: Josetta Huddle, MD  Chief Complaint:    Chief Complaint  Patient presents with  . Thoracic Aortic Aneurysm     f/u with CTA Chest    History of Present Illness:    Patient returns today for followup CT scanfor evaluation of dilated ascending  aorta.   He  has known CAD with  a stent was placed in his circumflex coronary artery in the past.   His previous diagnosis of prostate cancer treatment at [facility]    Patient has significant underlying pulmonary disease.  He has had difficulty ambulating because of chronic back pain.  He is also on anticoagulation for atrial fibrillation. He has had a cardiopulmonary stress test , which demonstrated reduced O2 consumption, low maximal exercise, severely limited FEV1.  He does note that his shortness of breath is slightly better than in the past with loss of weight from better diabetes control    cardiac catheterization in February 2019, no significant CAD was noted, no aortic valve insufficiency  Since last seen the patient notes she is sold to houses and moved twice and is now living back in [place].  He continues to be limited by chronic back and leg pain.   Current Activity/ Functional Status: Patient is independent with mobility/ambulation, transfers, ADL's, IADL's.   Past Medical History  Diagnosis Date  . Hypertension   . Obesities, morbid   . Pulmonary embolism     2008  . Anticoagulant long-term use   . CAD (coronary artery disease)     a. s/p BMS to 2nd OM in Sept 2012; b. LexiScan Myoview (12/2012):  Inf infarct; bowel and motion artifact make study difficult to interpret; no ischemia; not gated; Low Risk  . OA (osteoarthritis)   . LVH (left ventricular hypertrophy)   . Colonic  polyp   . Mild intermittent asthma   . Hemorrhoids   . Generalized headaches   . SOB (shortness of breath)     using oxygen with exercise  . Diastolic dysfunction   . ASCVD (arteriosclerotic cardiovascular disease)     Prior BMS to the 2nd OM in September 2012; with repeat cath in October showing patency  . GERD (gastroesophageal reflux disease)   . IBS (irritable bowel syndrome)   . Aortic root enlargement   .        Marland Kitchen PAF (paroxysmal atrial fibrillation)     treated with Coumadin  . Hearing loss   . Contact lens/glasses fitting   . OSA (obstructive sleep apnea)     PSG 03/30/2076 AHI 21, BPAP 13/9  . Sleep apnea   . Prostate ca   . Thoracic aortic aneurysm     Aortic Size Index=     5.0    /Body surface area is 2.43 meters squared. = 2.05  < 2.75 cm/m2      4% risk per year 2.75 to 4.25          8% risk per year > 4.25 cm/m2    20% risk per year   Stable aneurysmal dilation of the ascending aorta with  maximum AP diameter of 4.8 cm. Stable area of narrowing of the proximal most portion of the descending aorta measuring 2 cm., previously identified as an area of coarctation. No evidence of aortic dissection.  Coronary artery disease.  Normal appearance of the lungs.   Electronically Signed   By: Fidela Salisbury M.D.   On: 10/01/2014 08:50      Past Surgical History:  Procedure Laterality Date  . ACHILLES TENDON REPAIR Bilateral   . AORTIC ARCH ANGIOGRAPHY N/A 03/13/2017   Procedure: AORTIC ARCH ANGIOGRAPHY;  Surgeon: Martinique, [name], MD;  Location: [facility];  Service: Cardiovascular;  Laterality: N/A;  . APPENDECTOMY    . ATRIAL FIBRILLATION ABLATION  05/11/2017  . ATRIAL FIBRILLATION ABLATION N/A 05/11/2017   Procedure: ATRIAL FIBRILLATION ABLATION;  Surgeon: [name], MD;  Location: [facility];  Service: Cardiovascular;  Laterality: N/A;  . BACK SURGERY     "I've had 7 back and 1 neck ORs" (05/11/2017)  . BIOPSY  03/14/2018   Procedure: BIOPSY;  Surgeon:  Ronnette Juniper, MD;  Location: [facility];  Service: Gastroenterology;;  EGD and Colon  . CARDIAC CATHETERIZATION  2006  . CARPAL TUNNEL RELEASE Bilateral    LEFT  . CATARACT EXTRACTION W/ INTRAOCULAR LENS  IMPLANT, BILATERAL Bilateral   . CERVICAL SPINE SURGERY  06/02/2010   lower back and neck  . COLONOSCOPY N/A 03/14/2018   Procedure: COLONOSCOPY;  Surgeon: Ronnette Juniper, MD;  Location: [facility];  Service: Gastroenterology;  Laterality: N/A;  . COLONOSCOPY WITH PROPOFOL N/A 12/29/2014   Procedure: COLONOSCOPY WITH PROPOFOL;  Surgeon: Garlan Fair, MD;  Location: [facility];  Service: Endoscopy;  Laterality: N/A;  . CORONARY ANGIOPLASTY WITH STENT PLACEMENT  October 2012  . CORONARY STENT PLACEMENT  Sept 2012   2nd OM with BMS  . ESOPHAGOGASTRODUODENOSCOPY N/A 03/14/2018   Procedure: ESOPHAGOGASTRODUODENOSCOPY (EGD);  Surgeon: Ronnette Juniper, MD;  Location: [facility];  Service: Gastroenterology;  Laterality: N/A;  . HEMORROIDECTOMY    . LAMINECTOMY  05/30/2012   L 4 L5  . LAMINECTOMY WITH POSTERIOR LATERAL ARTHRODESIS LEVEL 3 N/A 10/18/2016   Procedure: Posterior Lateral Fusion - Lumbar One-Four, segmental instrumentation Lumbar One-Five,  decompression,;  Surgeon: Eustace Moore, MD;  Location: [facility];  Service: Neurosurgery;  Laterality: N/A;  . LAPAROSCOPIC CHOLECYSTECTOMY    . LAPAROSCOPIC GASTRIC BANDING    . LEFT AND RIGHT HEART CATHETERIZATION WITH CORONARY ANGIOGRAM N/A 05/07/2014   Procedure: LEFT AND RIGHT HEART CATHETERIZATION WITH CORONARY ANGIOGRAM;  Surgeon: [name] Martinique, MD;  Location: [facility];  Service: Cardiovascular;  Laterality: N/A;  . LEFT HEART CATH AND CORONARY ANGIOGRAPHY N/A 03/13/2017   Procedure: LEFT HEART CATH AND CORONARY ANGIOGRAPHY;  Surgeon: Martinique, [name], MD;  Location: [facility];  Service: Cardiovascular;  Laterality: N/A;  . LUMBAR LAMINECTOMY/DECOMPRESSION MICRODISCECTOMY N/A 05/04/2016   Procedure: Laminectomy and Foraminotomy -  Thoracic twelve-Lumbar one -Posterior Fusion Lumbar one-two;  Surgeon: Eustace Moore, MD;  Location: [facility];  Service: Neurosurgery;  Laterality: N/A;  . POLYPECTOMY  03/14/2018   Procedure: POLYPECTOMY;  Surgeon: Ronnette Juniper, MD;  Location: [facility];  Service: Gastroenterology;;  . POSTERIOR LUMBAR FUSION  10/18/2016  . SHOULDER OPEN ROTATOR CUFF REPAIR Bilateral   . TONSILLECTOMY AND ADENOIDECTOMY    . TOTAL HIP ARTHROPLASTY Right 10/18/2018   Procedure: RIGHT TOTAL HIP ARTHROPLASTY ANTERIOR APPROACH;  Surgeon: Mcarthur Rossetti, MD;  Location: [facility];  Service: Orthopedics;  Laterality: Right;  .  TRIGGER FINGER RELEASE     LEFT  . UVULOPALATOPHARYNGOPLASTY    . VASECTOMY      Family History  Problem Relation Age of Onset  . Heart disease Mother   . Diabetes Mother   . Other Mother        stent placement  . Emphysema Father 25  . Heart attack Sister     Social History   Socioeconomic History  . Marital status: Married    Spouse name: Not on file  . Number of children: 3  . Years of education: Not on file  . Highest education level: Not on file  Occupational History  . Occupation: Retired from Scientist, clinical (histocompatibility and immunogenetics): RETIRED  Tobacco Use  . Smoking status: Former Smoker    Packs/day: 1.50    Years: 30.00    Pack years: 45.00    Quit date: 01/24/1992    Years since quitting: 27.2  . Smokeless tobacco: Never Used  Substance and Sexual Activity  . Alcohol use: No  . Drug use: No  . Sexual activity: Not Currently  Other Topics Concern  . Not on file  Social History Narrative  . Not on file   Social Determinants of Health   Financial Resource Strain:   . Difficulty of Paying Living Expenses:   Food Insecurity:   . Worried About Charity fundraiser in the Last Year:   . Arboriculturist in the Last Year:   Transportation Needs:   . Film/video editor (Medical):   Marland Kitchen Lack of Transportation (Non-Medical):   Physical Activity:   . Days of Exercise per Week:    . Minutes of Exercise per Session:   Stress:   . Feeling of Stress :   Social Connections:   . Frequency of Communication with Friends and Family:   . Frequency of Social Gatherings with Friends and Family:   . Attends Religious Services:   . Active Member of Clubs or Organizations:   . Attends Archivist Meetings:   Marland Kitchen Marital Status:   Intimate Partner Violence:   . Fear of Current or Ex-Partner:   . Emotionally Abused:   Marland Kitchen Physically Abused:   . Sexually Abused:     Social History   Tobacco Use  Smoking Status Former Smoker  . Packs/day: 1.50  . Years: 30.00  . Pack years: 45.00  . Quit date: 01/24/1992  . Years since quitting: 27.2  Smokeless Tobacco Never Used    Social History   Substance and Sexual Activity  Alcohol Use No     Allergies  Allergen Reactions  . Ace Inhibitors Cough  . Amoxicillin-Pot Clavulanate     Other reaction(s): GI Upset (intolerance)  . Quinolones     Patient was warned about not using Cipro and similar antibiotics. Recent studies have raised concern that fluoroquinolone antibiotics could be associated with an increased risk of aortic aneurysm Fluoroquinolones have non-antimicrobial properties that might jeopardise the integrity of the extracellular matrix of the vascular wall In a  propensity score matched cohort study in [place], there was a 66% increased rate of aortic aneurysm or dissection associated with oral fluoroquinolone use, compared wit  . Adhesive [Tape] Itching and Rash  . Latex Itching, Rash and Other (See Comments)    Bandaids  . Morphine Itching    Current Outpatient Medications  Medication Sig Dispense Refill  . acetaminophen (TYLENOL) 500 MG tablet Take 1,000 mg by mouth every 8 (eight) hours as needed  for mild pain or headache.     . albuterol (PROVENTIL HFA;VENTOLIN HFA) 108 (90 Base) MCG/ACT inhaler Inhale 2 puffs into the lungs every 4 (four) hours as needed for wheezing or shortness of breath.    Marland Kitchen  atorvastatin (LIPITOR) 10 MG tablet Take 5 mg by mouth daily at 6 PM.     . augmented betamethasone dipropionate (DIPROLENE-AF) 0.05 % cream Apply 1 application topically 2 (two) times daily as needed for itching.  3  . budesonide-formoterol (SYMBICORT) 80-4.5 MCG/ACT inhaler Inhale 2 puffs into the lungs 2 (two) times daily as needed (for shortness of breath).     . celecoxib (CELEBREX) 200 MG capsule Take 200 mg by mouth 2 (two) times daily as needed for moderate pain.  0  . Cholecalciferol (VITAMIN D-3) 5000 units TABS Take 5,000 Units by mouth daily.    . Coenzyme Q10 100 MG capsule Take 100 mg by mouth daily.     . furosemide (LASIX) 80 MG tablet Take 80 mg by mouth daily. Take 80 mg in the morning     . JARDIANCE 25 MG TABS tablet Take 1 tablet by mouth daily.    Marland Kitchen KLOR-CON M20 20 MEQ tablet TAKE 2 TABLETS (40 MEQ TOTAL) BY MOUTH 2 (TWO) TIMES DAILY. 360 tablet 2  . losartan (COZAAR) 25 MG tablet Take 1 tablet (25 mg total) by mouth daily. 90 tablet 1  . methocarbamol (ROBAXIN) 500 MG tablet Take 1 tablet (500 mg total) by mouth every 8 (eight) hours as needed for muscle spasms. 60 tablet 1  . metoprolol tartrate (LOPRESSOR) 25 MG tablet Take 12.5 mg by mouth 2 (two) times daily.    . montelukast (SINGULAIR) 10 MG tablet Take 10 mg by mouth at bedtime.    . nitroGLYCERIN (NITROSTAT) 0.4 MG SL tablet Place 1 tablet (0.4 mg total) under the tongue every 5 (five) minutes as needed for chest pain. 25 tablet 5  . pantoprazole (PROTONIX) 20 MG tablet Take 20 mg by mouth daily.    Vladimir Faster Glycol-Propyl Glycol (SYSTANE OP) Place 1 drop into both eyes daily as needed (for dry eyes).     . psyllium (METAMUCIL) 58.6 % packet Take 1 packet by mouth daily.    . rivaroxaban (XARELTO) 20 MG TABS tablet Take 1 tablet (20 mg total) by mouth daily with supper. 30 tablet   . sildenafil (VIAGRA) 100 MG tablet Take 100 mg by mouth as needed for erectile dysfunction.     . Skin Protectants, Misc. (EUCERIN)  cream Apply 1 application topically as needed for dry skin.    Marland Kitchen spironolactone (ALDACTONE) 25 MG tablet Take 25 mg by mouth daily.    . tamsulosin (FLOMAX) 0.4 MG CAPS Take 0.4 mg by mouth every evening.     . topiramate (TOPAMAX) 25 MG capsule Take 50 mg by mouth 2 (two) times daily.      No current facility-administered medications for this visit.       Review of Systems:     Review of Systems:     Cardiac Review of Systems: Y or N  Chest Pain [ y ]  Resting SOB Kaito.Grams  ] Exertional SOB  [y]  Orthopnea Blue.Reese ]   Pedal Edema Blue.Reese   Palpitations [ y] Syncope  [ nn ]   Presyncope [n ]  General Review of Systems: [Y] = yes [  ]=no Constitional: recent weight change [  ]; anorexia [  ]; fatigue [  ]; nausea [  ];  night sweats [  ]; fever [  ]; or chills [  ];                                                                                                                                          Dental: poor dentition[  ];   Eye : blurred vision [  ]; diplopia [   ]; vision changes [  ];  Amaurosis fugax[  ]; Resp: cough [  ];  wheezing[  ];  hemoptysis[  ]; shortness of breath[y ]; paroxysmal nocturnal dyspnea[y]; dyspnea on exertion[y]; or orthopnea[  ];  GI:  gallstones[  ], vomiting[  ];  dysphagia[  ]; melena[  ];  hematochezia [  ]; heartburn[  ];   Hx of  Colonoscopy[  ]; GU: kidney stones [  ]; hematuria[  ];   dysuria [  ];  nocturia[  ];  history of     obstruction [  ];                 Skin: rash, swelling[  ];, hair loss[  ];  peripheral edema[  ];  or itching[  ]; Musculosketetal: myalgias[  ];  joint swelling[  ];  joint erythema[  ];  joint pain[  ];  back pain[  ];  Heme/Lymph: bruising[  ];  bleeding[  ];  anemia[  ];  Neuro: TIA[  ];  headaches[  ];  stroke[  ];  vertigo[  ];  seizures[  ];   paresthesias[  ];  difficulty walking[Y ];  Psych:depression[  ]; anxiety[  ];  Endocrine: diabetes[  ];  thyroid dysfunction[  ];  Immunizations: Flu [  y; Pneumococcal[y ]; covid yes    Other:    Physical Exam: BP 99/63 (BP Location: Left Arm, Patient Position: Sitting, Cuff Size: Large)   Pulse 74 Comment: irreg  Temp 97.7 F (36.5 C)   Resp 16   Ht 5\' 5"  (1.651 m)   Wt 238 lb (108 kg)   SpO2 97% Comment: ON RA  BMI 39.61 kg/m  General appearance: alert, cooperative and no distress Neck: no adenopathy, no carotid bruit, no JVD, supple, symmetrical, trachea midline and thyroid not enlarged, symmetric, no tenderness/mass/nodules Resp: clear to auscultation bilaterally Cardio: regular rate and rhythm, S1, S2 normal, no murmur, click, rub or gallop GI: soft, non-tender; bowel sounds normal; no masses,  no organomegaly Extremities: extremities normal, atraumatic, no cyanosis or edema and Homans sign is negative, no sign of DVT Neurologic: Grossly normal Patient does have difficulty walking but is able to get in and out of the office on his own  Diagnostic Studies & Laboratory data:     Recent Radiology Findings:   CT ANGIO CHEST AORTA W/CM & OR WO/CM  Result Date: 04/24/2019 CLINICAL DATA:  Thoracic aortic prominence EXAM: CT ANGIOGRAPHY CHEST WITH CONTRAST TECHNIQUE: Multidetector CT imaging of  the chest was performed using the standard protocol during bolus administration of intravenous contrast. Multiplanar CT image reconstructions and MIPs were obtained to evaluate the vascular anatomy. CONTRAST:  43mL ISOVUE-370 IOPAMIDOL (ISOVUE-370) INJECTION 76% COMPARISON:  August 15, 2018 FINDINGS: Cardiovascular: Ascending thoracic aortic diameter is again noted to measure 4.8 x 4.8 cm. The measured diameter of the aorta at the level of the sinuses of Valsalva measures 4.8 cm. Measured diameter at the aortic arch measures 3.7 cm. The descending thoracic aortic measurement in the mid descending thoracic aortic region measures 2.9 x 2.8 cm. There is no evident thoracic dissection. There is slight calcification at the origin of the left subclavian artery. Great vessels which are  visualized otherwise appear normal. There is aortic atherosclerosis. There is coronary artery calcification at several sites. There is no pericardial effusion or pericardial thickening. No pulmonary embolus is evident on this study. Mediastinum/Nodes: Visualized thyroid appears unremarkable. No evident thoracic adenopathy. No esophageal lesions are appreciable. Lungs/Pleura: There is mild bibasilar atelectatic change. A small bulla is noted in the right upper lobe, stable. There is no airspace opacification or edema. No evident pleural effusions. Upper Abdomen: A lap band is noted at the gastric cardia. Gallbladder is absent. Visualized upper abdominal structures otherwise appear unremarkable. Musculoskeletal: There is degenerative change in the thoracic spine with diffuse idiopathic skeletal hyperostosis in the mid to lower thoracic region. No blastic or lytic bone lesions are evident. No evident chest wall lesions. Review of the MIP images confirms the above findings. IMPRESSION: 1. Ascending thoracic aortic diameter is again measured at 4.8 x 4.8 cm. No evident dissection. Foci of aortic atherosclerosis as well as occasional foci of coronary artery calcification noted. Ascending thoracic aortic aneurysm. Recommend semi-annual imaging followup by CTA or MRA and referral to cardiothoracic surgery if not already obtained. This recommendation follows 2010 ACCF/AHA/AATS/ACR/ASA/SCA/SCAI/SIR/STS/SVM Guidelines for the Diagnosis and Management of Patients With Thoracic Aortic Disease. Circulation. 2010; [id number]. Aortic aneurysm NOS (ICD10-I71.9). 2.  No pulmonary embolus. 3. No edema or airspace opacity. Scattered areas of atelectatic change noted. 4.  No evident adenopathy. 5.  Lap band at gastric cardia. 6.  Gallbladder absent. 7.  Diffuse idiopathic skeletal hyperostosis in the thoracic region. Aortic aneurysm NOS (ICD10-I71.9). Aortic Atherosclerosis (ICD10-I70.0). Electronically Signed   By: Lowella Grip III M.D.   On: 04/24/2019 12:57     I have independently reviewed the above radiology studies  and reviewed the findings with the patient.   Recent Lab Findings: Lab Results  Component Value Date   WBC 7.3 10/21/2018   HGB 9.5 (L) 10/21/2018   HCT 29.8 (L) 10/21/2018   PLT 125 (L) 10/21/2018   GLUCOSE 143 (H) 10/21/2018   CHOL 118 01/07/2013   TRIG 133 01/07/2013   HDL 40 01/07/2013   LDLCALC 51 01/07/2013   ALT 13 10/21/2018   AST 13 (L) 10/21/2018   NA 137 10/21/2018   K 4.1 10/21/2018   CL 108 10/21/2018   CREATININE 0.87 10/21/2018   BUN 21 10/21/2018   CO2 20 (L) 10/21/2018   TSH 1.29 05/11/2015   INR 1.3 (H) 03/08/2017   HGBA1C 6.7 (H) 10/15/2018   Aortic Size Index=    5.0   /Body surface area is 2.23 meters squared. =2.12 < 2.75 cm/m2      4% risk per year 2.75 to 4.25          8% risk per year > 4.25 cm/m2    20% risk per year  Cardiac cath February 2019: Procedures   AORTIC ARCH ANGIOGRAPHY  LEFT HEART CATH AND CORONARY ANGIOGRAPHY  Conclusion     Previously placed Ost 2nd Mrg to 2nd Mrg stent (unknown type) is widely patent.  There is no aortic valve regurgitation.   1. No significant obstructive CAD 2. Aneurysmal thoracic aorta.   Plan: continue medical management . Consider pulmonary evaluation for symptoms of dyspnea.    Indications   Chest pain, unspecified type [R07.9 (ICD-10-CM)]  Procedural Details/Technique   Technical Details Indication: 79 yo WM S/p stenting of OM with BMS in 2012. Presents with atypical chest pain and dyspnea. Abnormal Myoview with inferior ischemia  Procedural Details: The left wrist was prepped, draped, and anesthetized with 1% lidocaine. Using the modified Seldinger technique, a 6 French slender sheath was introduced into the left radial artery. 3 mg of verapamil was administered through the sheath, weight-based unfractionated heparin was administered intravenously. Standard Judkins catheters were used  for selective coronary angiography and aortography. Unable to cross the AV due to distortion of the aorta. A LA 1 catheter was used to engage the RCA. A JL6 catheter was used to engage the LCA. Catheter exchanges were performed over an exchange length guidewire. There were no immediate procedural complications. A TR band was used for radial hemostasis at the completion of the procedure. The patient was transferred to the post catheterization recovery area for further monitoring.  Contrast: 97 cc   Estimated blood loss <50 mL.  During this procedure the patient was administered the following to achieve and maintain moderate conscious sedation: Versed 1 mg, Fentanyl 25 mcg, while the patient's heart rate, blood pressure, and oxygen saturation were continuously monitored. The period of conscious sedation was 24 minutes, of which I was present face-to-face 100% of this time.  Complications   Complications documented before study signed (03/13/2017 11:35 AM EST)    No complications were associated with this study.  Documented by Martinique, [name], MD - 03/13/2017 11:33 AM EST    Coronary Findings   Diagnostic  Dominance: Left  Left Main  Vessel was injected. Vessel is normal in caliber. Vessel is angiographically normal. The vessel originates from a separate ostium.  Left Anterior Descending  Vessel was injected. Vessel is small. Vessel is angiographically normal. There is mild focal disease in the vessel.  Second Septal Branch  Left Circumflex  Vessel was injected. Vessel is large. Vessel is angiographically normal.  Second Obtuse Marginal Branch  Ost 2nd Mrg to 2nd Mrg lesion 0% stenosed  Previously placed Ost 2nd Mrg to 2nd Mrg stent (unknown type) is widely patent.  Right Coronary Artery  Vessel was injected. Vessel is small. Vessel is angiographically normal.  Intervention   No interventions have been documented.  Left Heart   Aorta Aortic Root: The aortic root is aneurysmal and is  enlarged. There is no aortic valve regurgitation.  Coronary Diagrams   Diagnostic Diagram        Echocardiogram February 2019: Echocardiography  Patient:    Rhyatt, Kluz MR #:       [id number] Study Date: 03/07/2017 Gender:     M Age:        38 Height:     170.2 cm Weight:     122.9 kg BSA:        2.47 m^2 Pt. Status: Room:   SONOGRAPHER  Victorio Palm, RDCS  ATTENDING    Lendon Colonel  ORDERING     Lendon Colonel  REFERRING  Lendon Colonel  PERFORMING   [facility], Outpatient  cc:  ------------------------------------------------------------------- LV EF: 55% -   60%  ------------------------------------------------------------------- Indications:      (I25.10). GLS -21.5  ------------------------------------------------------------------- History:   PMH:  Acquired from the patient and from the patient&'s chart.  Chest pressure.  Dyspnea.  Paroxysmal atrial fibrillation. Coronary artery disease.  Congestive heart failure.  Aortic aneurysm.  Risk factors:  Hypertension. Diabetes mellitus. Morbidly obese. Dyslipidemia.  ------------------------------------------------------------------- Study Conclusions  - Left ventricle: The cavity size was normal. Wall thickness was   increased in a pattern of mild LVH. Systolic function was normal.   The estimated ejection fraction was in the range of 55% to 60%.   Wall motion was normal; there were no regional wall motion   abnormalities. Left ventricular diastolic function parameters   were normal. - Aortic valve: There was very mild stenosis. Valve area (VTI):   1.81 cm^2. Valve area (Vmax): 1.94 cm^2. Valve area (Vmean): 1.85   cm^2. - Right atrium: The atrium was mildly dilated. - Atrial septum: No defect or patent foramen ovale was identified. - Pulmonary arteries: PA peak pressure: 54 mm Hg (S).  ------------------------------------------------------------------- Labs, prior tests,  procedures, and surgery: Echocardiography (April 2016).     EF was 55%.  Catheterization (2012).    The study demonstrated coronary artery disease. There was a stenosis in the 2nd obtuse marginal artery which was treated with a bare-metal stent. Cardioversion.  ------------------------------------------------------------------- Study data:  Comparison was made to the study of 2016.  Study status:  Routine.  Procedure:  The patient reported no pain pre or post test. Transthoracic echocardiography for diagnosis, for left ventricular function evaluation, for right ventricular function evaluation, and for assessment of valvular function. Image quality was adequate.  Study completion:  There were no complications.     Echocardiography.  M-mode, complete 2D, spectral Doppler, and color Doppler.  Birthdate:  Patient birthdate: 02-17-1940.  Age: Patient is 79 yr old.  Sex:  Gender: male.    BMI: 42.4 kg/m^2. Blood pressure:     102/56  Patient status:  Outpatient.  Study date:  Study date: 03/07/2017. Study time: 08:39 AM.  Location: [facility]  -------------------------------------------------------------------  ------------------------------------------------------------------- Left ventricle:  The cavity size was normal. Wall thickness was increased in a pattern of mild LVH. Systolic function was normal. The estimated ejection fraction was in the range of 55% to 60%. Wall motion was normal; there were no regional wall motion abnormalities. The transmitral flow pattern was normal. The deceleration time of the early transmitral flow velocity was normal. The pulmonary vein flow pattern was normal. The tissue Doppler parameters were normal. Left ventricular diastolic function parameters were normal.  ------------------------------------------------------------------- Aortic valve:   Probably trileaflet; mildly thickened, mildly calcified leaflets. Mobility was not restricted.   Doppler:   There was very mild stenosis.   There was no regurgitation.    VTI ratio of LVOT to aortic valve: 0.48. Valve area (VTI): 1.81 cm^2. Indexed valve area (VTI): 0.73 cm^2/m^2. Peak velocity ratio of LVOT to aortic valve: 0.51. Valve area (Vmax): 1.94 cm^2. Indexed valve area (Vmax): 0.78 cm^2/m^2. Mean velocity ratio of LVOT to aortic valve: 0.49. Valve area (Vmean): 1.85 cm^2. Indexed valve area (Vmean): 0.75 cm^2/m^2.    Mean gradient (S): 9 mm Hg. Peak gradient (S): 17 mm Hg.  ------------------------------------------------------------------- Aorta:  The aorta was moderately dilated. Aortic root: The aortic root was normal in size. Ascending aorta: The ascending aorta was mildly dilated.  -------------------------------------------------------------------  Mitral valve:   Mildly thickened leaflets . Mobility was not restricted.  Doppler:  Transvalvular velocity was within the normal range. There was no evidence for stenosis. There was trivial regurgitation.    Peak gradient (D): 3 mm Hg.  ------------------------------------------------------------------- Left atrium:  The atrium was normal in size.  ------------------------------------------------------------------- Atrial septum:  No defect or patent foramen ovale was identified.   ------------------------------------------------------------------- Right ventricle:  The cavity size was normal. Wall thickness was normal. Systolic function was normal.  ------------------------------------------------------------------- Pulmonic valve:    Doppler:  Transvalvular velocity was within the normal range. There was no evidence for stenosis. There was mild regurgitation.  ------------------------------------------------------------------- Tricuspid valve:   Structurally normal valve.    Doppler: Transvalvular velocity was within the normal range. There was  mild regurgitation.  ------------------------------------------------------------------- Pulmonary artery:   The main pulmonary artery was normal-sized. Systolic pressure was within the normal range.  ------------------------------------------------------------------- Right atrium:  The atrium was mildly dilated.  ------------------------------------------------------------------- Pericardium:  The pericardium was normal in appearance. There was no pericardial effusion.  ------------------------------------------------------------------- Systemic veins: Inferior vena cava: The vessel was dilated. The respirophasic diameter changes were blunted (< 50%), consistent with elevated central venous pressure.  ------------------------------------------------------------------- Post procedure conclusions Ascending Aorta:  - The aorta was moderately dilated.  ------------------------------------------------------------------- Measurements   Left ventricle                            Value          Reference  LV ID, ED, PLAX chordal                   50    mm       43 - 52  LV ID, ES, PLAX chordal                   37    mm       23 - 38  LV fx shortening, PLAX chordal    (L)     26    %        >=29  LV PW thickness, ED                       14    mm       ---------  IVS/LV PW ratio, ED                       0.93           <=1.3  Stroke volume, 2D                         93    ml       ---------  Stroke volume/bsa, 2D                     38    ml/m^2   ---------  LV e&', lateral                            5.44  cm/s     ---------  LV E/e&', lateral                          14.61          ---------  LV e&', medial  5.87  cm/s     ---------  LV E/e&', medial                           13.54          ---------  LV e&', average                            5.66  cm/s     ---------  LV E/e&', average                          14.06          ---------     Ventricular septum                        Value          Reference  IVS thickness, ED                         13    mm       ---------    LVOT                                      Value          Reference  LVOT ID, S                                22    mm       ---------  LVOT area                                 3.8   cm^2     ---------  LVOT ID                                   22    mm       ---------  LVOT peak velocity, S                     105   cm/s     ---------  LVOT mean velocity, S                     66.1  cm/s     ---------  LVOT VTI, S                               24.4  cm       ---------  Stroke volume (SV), LVOT DP               92.8  ml       ---------  Stroke index (SV/bsa), LVOT DP            37.5  ml/m^2   ---------    Aortic valve                              Value  Reference  Aortic valve peak velocity, S             206   cm/s     ---------  Aortic valve mean velocity, S             136   cm/s     ---------  Aortic valve VTI, S                       51.2  cm       ---------  Aortic mean gradient, S                   9     mm Hg    ---------  Aortic peak gradient, S                   17    mm Hg    ---------  VTI ratio, LVOT/AV                        0.48           ---------  Aortic valve area, VTI                    1.81  cm^2     ---------  Aortic valve area/bsa, VTI                0.73  cm^2/m^2 ---------  Velocity ratio, peak, LVOT/AV             0.51           ---------  Aortic valve area, peak velocity          1.94  cm^2     ---------  Aortic valve area/bsa, peak               0.78  cm^2/m^2 ---------  velocity  Velocity ratio, mean, LVOT/AV             0.49           ---------  Aortic valve area, mean velocity          1.85  cm^2     ---------  Aortic valve area/bsa, mean               0.75  cm^2/m^2 ---------  velocity    Aorta                                     Value          Reference  Aortic root ID, ED                        42    mm        ---------  Ascending aorta ID, A-P, S                49    mm       ---------    Left atrium                               Value          Reference  LA ID, A-P, ES  32    mm       ---------  LA ID/bsa, A-P                            1.29  cm/m^2   <=2.2  LA volume, S                              25.9  ml       ---------  LA volume/bsa, S                          10.5  ml/m^2   ---------  LA volume, ES, 1-p A4C                    25.9  ml       ---------  LA volume/bsa, ES, 1-p A4C                10.5  ml/m^2   ---------  LA volume, ES, 1-p A2C                    24.9  ml       ---------  LA volume/bsa, ES, 1-p A2C                10.1  ml/m^2   ---------    Mitral valve                              Value          Reference  Mitral E-wave peak velocity               79.5  cm/s     ---------  Mitral A-wave peak velocity               105   cm/s     ---------  Mitral deceleration time                  229   ms       150 - 230  Mitral peak gradient, D                   3     mm Hg    ---------  Mitral E/A ratio, peak                    0.8            ---------    Pulmonary arteries                        Value          Reference  PA pressure, S, DP                (H)     54    mm Hg    <=30    Tricuspid valve                           Value          Reference  Tricuspid regurg peak velocity            338   cm/s     ---------  Tricuspid peak  RV-RA gradient             46    mm Hg    ---------    Right atrium                              Value          Reference  RA ID, S-I, ES                            48    mm       ---------  RA ID, M-L, ES, A4C                       44    mm       30 - 46    Systemic veins                            Value          Reference  Estimated CVP                             8     mm Hg    ---------    Right ventricle                           Value          Reference  RV ID, minor axis, ED, A4C base           46    mm        ---------  RV ID, minor axis, ED, A4C mid            26    mm       ---------  RV ID, major axis, ED, A4C                57    mm       55 - 91  RV pressure, S, DP                (H)     54    mm Hg    <=30  Legend: (L)  and  (H)  mark values outside specified reference range.  ------------------------------------------------------------------- Prepared and Electronically Authenticated by  Jenkins Rouge, M.D. 2019-02-13T11:50:02  Assessment / Plan:     Stable 4.9 cm ascending aortic dilatation.  Cardiac CT suggests a bileaflet valve echocardiogram suggest a trileaflet valve, no significant size change on CT of the chest today  No coronary disease of significance noted on cardiac cath February 2019 Patient remains limited by his shortness of breath, t  With the patient's overall poor medical condition I would not recommend elective repair of his ascending aorta   We'll plan to see the patient back in 8  months with a followup CTA of the chest   Grace Isaac MD  [place] Office [phone] 04/24/2019 2:06 PM

## 2019-04-29 DIAGNOSIS — E785 Hyperlipidemia, unspecified: Secondary | ICD-10-CM | POA: Diagnosis not present

## 2019-04-29 DIAGNOSIS — E559 Vitamin D deficiency, unspecified: Secondary | ICD-10-CM | POA: Diagnosis not present

## 2019-04-29 DIAGNOSIS — I5030 Unspecified diastolic (congestive) heart failure: Secondary | ICD-10-CM | POA: Diagnosis not present

## 2019-04-29 DIAGNOSIS — M545 Low back pain: Secondary | ICD-10-CM | POA: Diagnosis not present

## 2019-04-29 DIAGNOSIS — I2510 Atherosclerotic heart disease of native coronary artery without angina pectoris: Secondary | ICD-10-CM | POA: Diagnosis not present

## 2019-04-29 DIAGNOSIS — I10 Essential (primary) hypertension: Secondary | ICD-10-CM | POA: Diagnosis not present

## 2019-04-29 DIAGNOSIS — Z8546 Personal history of malignant neoplasm of prostate: Secondary | ICD-10-CM | POA: Diagnosis not present

## 2019-04-29 DIAGNOSIS — I480 Paroxysmal atrial fibrillation: Secondary | ICD-10-CM | POA: Diagnosis not present

## 2019-04-29 DIAGNOSIS — E0822 Diabetes mellitus due to underlying condition with diabetic chronic kidney disease: Secondary | ICD-10-CM | POA: Diagnosis not present

## 2019-04-29 DIAGNOSIS — J4530 Mild persistent asthma, uncomplicated: Secondary | ICD-10-CM | POA: Diagnosis not present

## 2019-04-29 DIAGNOSIS — D6869 Other thrombophilia: Secondary | ICD-10-CM | POA: Diagnosis not present

## 2019-04-29 DIAGNOSIS — K219 Gastro-esophageal reflux disease without esophagitis: Secondary | ICD-10-CM | POA: Diagnosis not present

## 2019-05-08 DIAGNOSIS — M48062 Spinal stenosis, lumbar region with neurogenic claudication: Secondary | ICD-10-CM | POA: Diagnosis not present

## 2019-05-13 ENCOUNTER — Other Ambulatory Visit: Payer: Self-pay | Admitting: Neurological Surgery

## 2019-05-13 ENCOUNTER — Other Ambulatory Visit: Payer: Self-pay | Admitting: Neurology

## 2019-05-13 DIAGNOSIS — M48062 Spinal stenosis, lumbar region with neurogenic claudication: Secondary | ICD-10-CM

## 2019-05-19 DIAGNOSIS — R972 Elevated prostate specific antigen [PSA]: Secondary | ICD-10-CM | POA: Diagnosis not present

## 2019-05-19 DIAGNOSIS — C61 Malignant neoplasm of prostate: Secondary | ICD-10-CM | POA: Diagnosis not present

## 2019-05-19 DIAGNOSIS — N529 Male erectile dysfunction, unspecified: Secondary | ICD-10-CM | POA: Diagnosis not present

## 2019-05-23 ENCOUNTER — Telehealth (INDEPENDENT_AMBULATORY_CARE_PROVIDER_SITE_OTHER): Payer: Medicare Other | Admitting: Internal Medicine

## 2019-05-23 VITALS — Ht 65.0 in | Wt 238.0 lb

## 2019-05-23 DIAGNOSIS — I10 Essential (primary) hypertension: Secondary | ICD-10-CM

## 2019-05-23 DIAGNOSIS — G4733 Obstructive sleep apnea (adult) (pediatric): Secondary | ICD-10-CM | POA: Diagnosis not present

## 2019-05-23 DIAGNOSIS — I4819 Other persistent atrial fibrillation: Secondary | ICD-10-CM | POA: Diagnosis not present

## 2019-05-23 DIAGNOSIS — D6869 Other thrombophilia: Secondary | ICD-10-CM | POA: Diagnosis not present

## 2019-05-23 NOTE — Progress Notes (Signed)
Electrophysiology TeleHealth Note   Due to national recommendations of social distancing due to COVID 19, an audio/video telehealth visit is felt to be most appropriate for this patient at this time.  See MyChart message from today for the patient's consent to telehealth for [facility].  Date:  05/23/2019   ID:  Carlos Dawson, DOB 1940/01/28, MRN [id number]  Location: patient's home  Provider location:  [place]  Evaluation Performed: Follow-up visit  PCP:  Josetta Huddle, MD   Electrophysiologist:  Dr Rayann Heman  Chief Complaint:  palpitations  History of Present Illness:    Carlos Dawson is a 79 y.o. male who presents via telehealth conferencing today.  Since last being seen in our clinic, the patient reports doing very well.  Today, he denies symptoms of palpitations, chest pain, shortness of breath,  lower extremity edema, dizziness, presyncope, or syncope.  The patient is otherwise without complaint today.   Past Medical History:  Diagnosis Date  . Anticoagulant long-term use   . Aortic root enlargement ([place])   . Ascending aortic aneurysm [facility])    recent scan in October 2012 showing no change; followed by Dr. Servando Snare  . ASCVD (arteriosclerotic cardiovascular disease)    Prior BMS to the 2nd OM in September 2012; with repeat cath in October showing patency  . CAD (coronary artery disease)    a. s/p BMS to 2nd OM in Sept 2012; b. LexiScan Myoview (12/2012):  Inf infarct; bowel and motion artifact make study difficult to interpret; no ischemia; not gated; Low Risk  . CHF (congestive heart failure) ([place])    no recent issues 10/13/14  . Chronic back pain    "all over my back" (05/11/2017)  . Colonic polyp   . Contact lens/glasses fitting   . Diastolic dysfunction   . DVT (deep venous thrombosis) ([place])    ?LLE  . Frequent headaches    "probably weekly" (05/11/2017)  . Generalized headaches    neck stenosis  . GERD (gastroesophageal reflux disease)   .  Hearing loss   . Hearing loss    more so on left  . Hemorrhoids   . History of stomach ulcers   . Hypertension   . IBS (irritable bowel syndrome)   . LVH (left ventricular hypertrophy)   . Mild intermittent asthma   . OA (osteoarthritis)    "all over" (05/11/2017)  . Obesities, morbid ([place])   . OSA (obstructive sleep apnea)    PSG 03/30/97 AHI 21, BPAP 13/9  . OSA on CPAP   . PAF (paroxysmal atrial fibrillation) ([place])    a. on Xarelto b. s/p DCCV in 08/2016; b. Tikosyn failed 04/16/17 with plans for Multaq and possible Afib ablation with Dr. Rayann Heman  . Pneumonia    'several times" (05/11/2017)  . Prostate CA [facility])    Oncologist  DR. Daralene Milch [facility] 09/24/14, undetermined tx   prostate; S/P "radiation and hormone injections"  . Pulmonary embolism ([place]) 2008   "both lungs"  . SOB (shortness of breath)    on excertion  . Thoracic aortic aneurysm ([facility])    Aortic Size Index=     5.0    /Body surface area is 2.43 meters squared. = 2.05  < 2.75 cm/m2      4% risk per year 2.75 to 4.25          8% risk per year > 4.25 cm/m2    20% risk per year   Stable aneurysmal dilation of the  ascending aorta with maximum AP diameter of 4.8 cm. Stable area of narrowing of the proximal most portion of the descending aorta measuring 2 cm., previously identified as an area of coarctation. No evidence of aortic dissection.  Coronary artery disease.  Normal appearance of the lungs.   Electronically Signed   By: Fidela Salisbury M.D.   On: 10/01/2014 08:50    . Type II diabetes mellitus ([place])    metphormin, average 154 dx 2017    Past Surgical History:  Procedure Laterality Date  . ACHILLES TENDON REPAIR Bilateral   . AORTIC ARCH ANGIOGRAPHY N/A 03/13/2017   Procedure: AORTIC ARCH ANGIOGRAPHY;  Surgeon: Martinique, [name], MD;  Location: [place] CV LAB;  Service: Cardiovascular;  Laterality: N/A;  . APPENDECTOMY    . ATRIAL FIBRILLATION ABLATION  05/11/2017  . ATRIAL FIBRILLATION ABLATION N/A 05/11/2017    Procedure: ATRIAL FIBRILLATION ABLATION;  Surgeon: [name], MD;  Location: [place] CV LAB;  Service: Cardiovascular;  Laterality: N/A;  . BACK SURGERY     "I've had 7 back and 1 neck ORs" (05/11/2017)  . BIOPSY  03/14/2018   Procedure: BIOPSY;  Surgeon: Ronnette Juniper, MD;  Location: [facility];  Service: Gastroenterology;;  EGD and Colon  . CARDIAC CATHETERIZATION  2006  . CARPAL TUNNEL RELEASE Bilateral    LEFT  . CATARACT EXTRACTION W/ INTRAOCULAR LENS  IMPLANT, BILATERAL Bilateral   . CERVICAL SPINE SURGERY  06/02/2010   lower back and neck  . COLONOSCOPY N/A 03/14/2018   Procedure: COLONOSCOPY;  Surgeon: Ronnette Juniper, MD;  Location: [facility];  Service: Gastroenterology;  Laterality: N/A;  . COLONOSCOPY WITH PROPOFOL N/A 12/29/2014   Procedure: COLONOSCOPY WITH PROPOFOL;  Surgeon: Garlan Fair, MD;  Location: [facility];  Service: Endoscopy;  Laterality: N/A;  . CORONARY ANGIOPLASTY WITH STENT PLACEMENT  October 2012  . CORONARY STENT PLACEMENT  Sept 2012   2nd OM with BMS  . ESOPHAGOGASTRODUODENOSCOPY N/A 03/14/2018   Procedure: ESOPHAGOGASTRODUODENOSCOPY (EGD);  Surgeon: Ronnette Juniper, MD;  Location: [facility];  Service: Gastroenterology;  Laterality: N/A;  . HEMORROIDECTOMY    . LAMINECTOMY  05/30/2012   L 4 L5  . LAMINECTOMY WITH POSTERIOR LATERAL ARTHRODESIS LEVEL 3 N/A 10/18/2016   Procedure: Posterior Lateral Fusion - Lumbar One-Four, segmental instrumentation Lumbar One-Five,  decompression,;  Surgeon: Eustace Moore, MD;  Location: [facility] OR;  Service: Neurosurgery;  Laterality: N/A;  . LAPAROSCOPIC CHOLECYSTECTOMY    . LAPAROSCOPIC GASTRIC BANDING    . LEFT AND RIGHT HEART CATHETERIZATION WITH CORONARY ANGIOGRAM N/A 05/07/2014   Procedure: LEFT AND RIGHT HEART CATHETERIZATION WITH CORONARY ANGIOGRAM;  Surgeon: [name] Martinique, MD;  Location: [facility] CATH LAB;  Service: Cardiovascular;  Laterality: N/A;  . LEFT HEART CATH AND CORONARY ANGIOGRAPHY N/A 03/13/2017   Procedure:  LEFT HEART CATH AND CORONARY ANGIOGRAPHY;  Surgeon: Martinique, [name], MD;  Location: [place] CV LAB;  Service: Cardiovascular;  Laterality: N/A;  . LUMBAR LAMINECTOMY/DECOMPRESSION MICRODISCECTOMY N/A 05/04/2016   Procedure: Laminectomy and Foraminotomy - Thoracic twelve-Lumbar one -Posterior Fusion Lumbar one-two;  Surgeon: Eustace Moore, MD;  Location: [place];  Service: Neurosurgery;  Laterality: N/A;  . POLYPECTOMY  03/14/2018   Procedure: POLYPECTOMY;  Surgeon: Ronnette Juniper, MD;  Location: [facility];  Service: Gastroenterology;;  . POSTERIOR LUMBAR FUSION  10/18/2016  . SHOULDER OPEN ROTATOR CUFF REPAIR Bilateral   . TONSILLECTOMY AND ADENOIDECTOMY    . TOTAL HIP ARTHROPLASTY Right 10/18/2018   Procedure: RIGHT TOTAL HIP ARTHROPLASTY ANTERIOR  APPROACH;  Surgeon: Mcarthur Rossetti, MD;  Location: [facility] ORS;  Service: Orthopedics;  Laterality: Right;  . TRIGGER FINGER RELEASE     LEFT  . UVULOPALATOPHARYNGOPLASTY    . VASECTOMY      Current Outpatient Medications  Medication Sig Dispense Refill  . acetaminophen (TYLENOL) 500 MG tablet Take 1,000 mg by mouth every 8 (eight) hours as needed for mild pain or headache.     . albuterol (PROVENTIL HFA;VENTOLIN HFA) 108 (90 Base) MCG/ACT inhaler Inhale 2 puffs into the lungs every 4 (four) hours as needed for wheezing or shortness of breath.    Marland Kitchen atorvastatin (LIPITOR) 10 MG tablet Take 5 mg by mouth daily at 6 PM.     . augmented betamethasone dipropionate (DIPROLENE-AF) 0.05 % cream Apply 1 application topically 2 (two) times daily as needed for itching.  3  . budesonide-formoterol (SYMBICORT) 80-4.5 MCG/ACT inhaler Inhale 2 puffs into the lungs 2 (two) times daily as needed (for shortness of breath).     . celecoxib (CELEBREX) 200 MG capsule Take 200 mg by mouth 2 (two) times daily as needed for moderate pain.  0  . Cholecalciferol (VITAMIN D-3) 5000 units TABS Take 5,000 Units by mouth daily.    . Coenzyme Q10 100 MG capsule Take 100 mg  by mouth daily.     . furosemide (LASIX) 80 MG tablet Take 80 mg by mouth daily. Take 80 mg in the morning     . JARDIANCE 25 MG TABS tablet Take 1 tablet by mouth daily.    Marland Kitchen KLOR-CON M20 20 MEQ tablet TAKE 2 TABLETS (40 MEQ TOTAL) BY MOUTH 2 (TWO) TIMES DAILY. 360 tablet 2  . losartan (COZAAR) 25 MG tablet Take 1 tablet (25 mg total) by mouth daily. 90 tablet 1  . methocarbamol (ROBAXIN) 500 MG tablet Take 1 tablet (500 mg total) by mouth every 8 (eight) hours as needed for muscle spasms. 60 tablet 1  . metoprolol tartrate (LOPRESSOR) 25 MG tablet Take 12.5 mg by mouth 2 (two) times daily.    . montelukast (SINGULAIR) 10 MG tablet Take 10 mg by mouth at bedtime.    . nitroGLYCERIN (NITROSTAT) 0.4 MG SL tablet Place 1 tablet (0.4 mg total) under the tongue every 5 (five) minutes as needed for chest pain. 25 tablet 5  . pantoprazole (PROTONIX) 20 MG tablet Take 20 mg by mouth daily.    Vladimir Faster Glycol-Propyl Glycol (SYSTANE OP) Place 1 drop into both eyes daily as needed (for dry eyes).     . psyllium (METAMUCIL) 58.6 % packet Take 1 packet by mouth daily.    . rivaroxaban (XARELTO) 20 MG TABS tablet Take 1 tablet (20 mg total) by mouth daily with supper. 30 tablet   . sildenafil (VIAGRA) 100 MG tablet Take 100 mg by mouth as needed for erectile dysfunction.     . Skin Protectants, Misc. (EUCERIN) cream Apply 1 application topically as needed for dry skin.    Marland Kitchen spironolactone (ALDACTONE) 25 MG tablet Take 25 mg by mouth daily.    . tamsulosin (FLOMAX) 0.4 MG CAPS Take 0.4 mg by mouth every evening.     . topiramate (TOPAMAX) 25 MG capsule Take 50 mg by mouth 2 (two) times daily.      No current facility-administered medications for this visit.    Allergies:   Ace inhibitors, Amoxicillin-pot clavulanate, Quinolones, Adhesive [tape], Latex, and Morphine   Social History:  The patient  reports that he quit smoking about  27 years ago. He has a 45.00 pack-year smoking history. He has never used  smokeless tobacco. He reports that he does not drink alcohol or use drugs.   ROS:  Please see the history of present illness.   All other systems are personally reviewed and negative.    Exam:    Vital Signs:  Ht 5\' 5"  (1.651 m)   Wt 238 lb (108 kg)   BMI 39.61 kg/m   Well sounding and appearing, alert and conversant, regular work of breathing,  good skin color Eyes- anicteric, neuro- grossly intact, skin- no apparent rash or lesions or cyanosis, mouth- oral mucosa is pink  Labs/Other Tests and Data Reviewed:    Recent Labs: 10/21/2018: ALT 13; BUN 21; Creatinine, Ser 0.87; Hemoglobin 9.5; Platelets 125; Potassium 4.1; Sodium 137   Wt Readings from Last 3 Encounters:  05/23/19 238 lb (108 kg)  04/24/19 238 lb (108 kg)  11/26/18 232 lb (105.2 kg)       ASSESSMENT & PLAN:    1.  Persistent atrial fibrillation/ atrial flutter Doing great post ablation off AAD therapy chads2vasc score is 6.  He is on xarelto He has occasional apple watch tracings that suggest "afib" though he is without symptoms.  I have asked that he send PDFs for me to review. Given paucity of symptoms, no change is advised.  2. HTN Stable No change required today  3. OSA Complaint with CPAP  4. Overweight Weight loss is advised  5. Chronic diastolic dysfunction Stable No change required today   Follow-up:  6 months with AF clinic I will see in a year   Patient Risk:  after full review of this patients clinical status, I feel that they are at moderate risk at this time.  Today, I have spent 15 minutes with the patient with telehealth technology discussing arrhythmia management .    Army Fossa, MD  05/23/2019 4:00 PM     [address] [place] [address] [phone] (office) [phone] (fax)

## 2019-06-07 ENCOUNTER — Other Ambulatory Visit: Payer: Self-pay

## 2019-06-07 ENCOUNTER — Ambulatory Visit
Admission: RE | Admit: 2019-06-07 | Discharge: 2019-06-07 | Disposition: A | Discharge status: Home / Self Care | Payer: Medicare Other | Source: Ambulatory Visit | Attending: Neurological Surgery | Admitting: Neurological Surgery

## 2019-06-07 DIAGNOSIS — M48061 Spinal stenosis, lumbar region without neurogenic claudication: Secondary | ICD-10-CM | POA: Diagnosis not present

## 2019-06-07 DIAGNOSIS — M48062 Spinal stenosis, lumbar region with neurogenic claudication: Secondary | ICD-10-CM

## 2019-06-10 DIAGNOSIS — N183 Chronic kidney disease, stage 3 unspecified: Secondary | ICD-10-CM | POA: Diagnosis not present

## 2019-06-10 DIAGNOSIS — I480 Paroxysmal atrial fibrillation: Secondary | ICD-10-CM | POA: Diagnosis not present

## 2019-06-10 DIAGNOSIS — I5030 Unspecified diastolic (congestive) heart failure: Secondary | ICD-10-CM | POA: Diagnosis not present

## 2019-06-10 DIAGNOSIS — E0822 Diabetes mellitus due to underlying condition with diabetic chronic kidney disease: Secondary | ICD-10-CM | POA: Diagnosis not present

## 2019-06-10 DIAGNOSIS — I10 Essential (primary) hypertension: Secondary | ICD-10-CM | POA: Diagnosis not present

## 2019-06-10 DIAGNOSIS — I4891 Unspecified atrial fibrillation: Secondary | ICD-10-CM | POA: Diagnosis not present

## 2019-06-10 DIAGNOSIS — E785 Hyperlipidemia, unspecified: Secondary | ICD-10-CM | POA: Diagnosis not present

## 2019-06-10 DIAGNOSIS — D649 Anemia, unspecified: Secondary | ICD-10-CM | POA: Diagnosis not present

## 2019-06-10 DIAGNOSIS — D509 Iron deficiency anemia, unspecified: Secondary | ICD-10-CM | POA: Diagnosis not present

## 2019-06-10 DIAGNOSIS — Z8546 Personal history of malignant neoplasm of prostate: Secondary | ICD-10-CM | POA: Diagnosis not present

## 2019-06-10 DIAGNOSIS — I2510 Atherosclerotic heart disease of native coronary artery without angina pectoris: Secondary | ICD-10-CM | POA: Diagnosis not present

## 2019-06-10 DIAGNOSIS — N400 Enlarged prostate without lower urinary tract symptoms: Secondary | ICD-10-CM | POA: Diagnosis not present

## 2019-06-17 DIAGNOSIS — M533 Sacrococcygeal disorders, not elsewhere classified: Secondary | ICD-10-CM | POA: Insufficient documentation

## 2019-06-17 DIAGNOSIS — M48062 Spinal stenosis, lumbar region with neurogenic claudication: Secondary | ICD-10-CM | POA: Diagnosis not present

## 2019-06-26 ENCOUNTER — Ambulatory Visit (INDEPENDENT_AMBULATORY_CARE_PROVIDER_SITE_OTHER): Payer: Medicare Other

## 2019-06-26 ENCOUNTER — Encounter: Payer: Self-pay | Admitting: Orthopaedic Surgery

## 2019-06-26 ENCOUNTER — Other Ambulatory Visit: Payer: Self-pay

## 2019-06-26 ENCOUNTER — Ambulatory Visit (INDEPENDENT_AMBULATORY_CARE_PROVIDER_SITE_OTHER): Payer: Medicare Other | Admitting: Orthopaedic Surgery

## 2019-06-26 DIAGNOSIS — Z96641 Presence of right artificial hip joint: Secondary | ICD-10-CM | POA: Diagnosis not present

## 2019-06-26 NOTE — Progress Notes (Signed)
Office Visit Note   Patient: Carlos Dawson           Date of Birth: [birth year]           MRN: [id number] Visit Date: 06/26/2019              Requested by: Josetta Huddle, MD [address] [place],  [place] [address] PCP: Josetta Huddle, MD   Assessment & Plan: Visit Diagnoses:  1. Status post total replacement of right hip     Plan: I did go over his x-rays in detail of his right hip and his left hip.  At this point follow-up can be as needed.  If he has any issues musculoskeletal at all he should not hesitate to call us and come see [place].  All questions and concerns were answered and addressed.  Follow-Up Instructions: Return if symptoms worsen or fail to improve.   Orders:  Orders Placed This Encounter  Procedures  . XR HIP UNILAT W OR W/O PELVIS 1V RIGHT   No orders of the defined types were placed in this encounter.     Procedures: No procedures performed   Clinical Data: No additional findings.   Subjective: Chief Complaint  Patient presents with  . Right Hip - Follow-up  The patient is 8 months out from a right total hip arthroplasty.  He says the hip is doing well.  He was having some thigh pain that is since dissipated.  He is still dealing with severe right-sided low back pain.  He has had major lumbar spine surgery in the past.  His neurosurgeon feels that this may be SI joint related.  He was scheduled for injection in that area but had to cancel it due to his wife having major colon surgery.  He has that rescheduled for later this month.  He denies any left hip pain.  He says the right hip is doing well overall.  He said no other acute change in medical status  HPI  Review of Systems .  Today he denies any headache, chest pain, shortness of breath, fever, chills, nausea, vomiting  Objective: Vital Signs: There were no vitals taken for this visit.  Physical Exam He is alert and orient x3 and in no acute distress Ortho Exam Examination of  his right hip shows it moves smoothly with no issues at all.  Examination of his left hip also shows normal hip exam.  His leg lengths are equal. Specialty Comments:  No specialty comments available.  Imaging: XR HIP UNILAT W OR W/O PELVIS 1V RIGHT  Result Date: 06/26/2019 An AP pelvis and lateral of the right hip shows a well-seated total hip arthroplasty with no complicating features.    PMFS History: Patient Active Problem List   Diagnosis Date Noted  . Status post total replacement of right hip 10/18/2018  . Unilateral primary osteoarthritis, right hip 03/26/2018  . Atrial fibrillation with rapid ventricular response ([place]) 04/15/2017  . SOB (shortness of breath)   . Sleep apnea   . Prostate CA ([place])   . Pneumonia   . PAF (paroxysmal atrial fibrillation) ([place])   . Mild intermittent asthma   . IBS (irritable bowel syndrome)   . Hearing loss   . GERD (gastroesophageal reflux disease)   . Generalized headaches   . Diastolic dysfunction   . Contact lens/glasses fitting   . Diabetes mellitus without complication ([place])   . Colonic polyp   . Heart failure ([place])   .  ASCVD (arteriosclerotic cardiovascular disease)   . Ascending aortic aneurysm ([place])   . Aortic root enlargement ([place])   . S/P lumbar spinal fusion 10/18/2016  . Chest pain 09/15/2016  . Paroxysmal atrial fibrillation with RVR ([place]) 09/15/2016  . Hypotension 09/15/2016  . S/P lumbar laminectomy 05/04/2016  . Oral candidiasis 03/30/2016  . Pain in right hip 03/29/2016  . Lumbar radiculopathy 03/29/2016  . Post laminectomy syndrome 03/29/2016  . Severe obesity (BMI >= 40) ([place]) 04/26/2015  . Asthma with acute exacerbation 04/22/2015  . CAP (community acquired pneumonia) 08/12/2013  . Encounter for therapeutic drug monitoring 03/03/2013  . Chest pain at rest 01/06/2013  . Chronic diastolic heart failure ([place]) 12/26/2012  . HLD (hyperlipidemia) 12/26/2012  . Allergic rhinitis 12/24/2012  . Long term (current) use  of anticoagulants 08/29/2012  . Lapband APL May 2009 10/25/2011  . Hypoxemia 12/08/2010  . Dyspnea 12/08/2010  . Hypertension   . Obesities, morbid ([place])   . Pulmonary embolism ([place])   . Anticoagulant long-term use   . CAD (coronary artery disease)   . Thoracic aortic aneurysm ([place])   . OA (osteoarthritis)   . LVH (left ventricular hypertrophy)   . Paroxysmal atrial fibrillation (H[facility])   . Mild persistent asthma, well controlled 10/21/2008  . OSA (obstructive sleep apnea) 08/06/2008   Past Medical History:  Diagnosis Date  . Anticoagulant long-term use   . Aortic root enlargement ([place])   . Ascending aortic aneurysm [facility])    recent scan in October 2012 showing no change; followed by Dr. Servando Snare  . ASCVD (arteriosclerotic cardiovascular disease)    Prior BMS to the 2nd OM in September 2012; with repeat cath in October showing patency  . CAD (coronary artery disease)    a. s/p BMS to 2nd OM in Sept 2012; b. LexiScan Myoview (12/2012):  Inf infarct; bowel and motion artifact make study difficult to interpret; no ischemia; not gated; Low Risk  . CHF (congestive heart failure) ([place])    no recent issues 10/13/14  . Chronic back pain    "all over my back" (05/11/2017)  . Colonic polyp   . Contact lens/glasses fitting   . Diastolic dysfunction   . DVT (deep venous thrombosis) ([place])    ?LLE  . Frequent headaches    "probably weekly" (05/11/2017)  . Generalized headaches    neck stenosis  . GERD (gastroesophageal reflux disease)   . Hearing loss   . Hearing loss    more so on left  . Hemorrhoids   . History of stomach ulcers   . Hypertension   . IBS (irritable bowel syndrome)   . LVH (left ventricular hypertrophy)   . Mild intermittent asthma   . OA (osteoarthritis)    "all over" (05/11/2017)  . Obesities, morbid ([place])   . OSA (obstructive sleep apnea)    PSG 03/30/97 AHI 21, BPAP 13/9  . OSA on CPAP   . PAF (paroxysmal atrial fibrillation) ([place])    a. on Xarelto b. s/p DCCV  in 08/2016; b. Tikosyn failed 04/16/17 with plans for Multaq and possible Afib ablation with Dr. Rayann Heman  . Pneumonia    'several times" (05/11/2017)  . Prostate CA [facility])    Oncologist  DR. Daralene Milch [facility] 09/24/14, undetermined tx   prostate; S/P "radiation and hormone injections"  . Pulmonary embolism ([place]) 2008   "both lungs"  . SOB (shortness of breath)    on excertion  . Thoracic aortic aneurysm (H[facility])    Aortic Size Index=  5.0    /Body surface area is 2.43 meters squared. = 2.05  < 2.75 cm/m2      4% risk per year 2.75 to 4.25          8% risk per year > 4.25 cm/m2    20% risk per year   Stable aneurysmal dilation of the ascending aorta with maximum AP diameter of 4.8 cm. Stable area of narrowing of the proximal most portion of the descending aorta measuring 2 cm., previously identified as an area of coarctation. No evidence of aortic dissection.  Coronary artery disease.  Normal appearance of the lungs.   Electronically Signed   By: Fidela Salisbury M.D.   On: 10/01/2014 08:50    . Type II diabetes mellitus (HCC)    metphormin, average 154 dx 2017    Family History  Problem Relation Age of Onset  . Heart disease Mother   . Diabetes Mother   . Other Mother        stent placement  . Emphysema Father 64  . Heart attack Sister     Past Surgical History:  Procedure Laterality Date  . ACHILLES TENDON REPAIR Bilateral   . AORTIC ARCH ANGIOGRAPHY N/A 03/13/2017   Procedure: AORTIC ARCH ANGIOGRAPHY;  Surgeon: Martinique, [name], MD;  Location: [place] [facility];  Service: Cardiovascular;  Laterality: N/A;  . APPENDECTOMY    . ATRIAL FIBRILLATION ABLATION  05/11/2017  . ATRIAL FIBRILLATION ABLATION N/A 05/11/2017   Procedure: ATRIAL FIBRILLATION ABLATION;  Surgeon: [name], MD;  Location: [place] [facility];  Service: Cardiovascular;  Laterality: N/A;  . BACK SURGERY     "I've had 7 back and 1 neck ORs" (05/11/2017)  . BIOPSY  03/14/2018   Procedure: BIOPSY;  Surgeon: Ronnette Juniper, MD;  Location: [facility];  Service: Gastroenterology;;  EGD and Colon  . CARDIAC CATHETERIZATION  2006  . CARPAL TUNNEL RELEASE Bilateral    LEFT  . CATARACT EXTRACTION W/ INTRAOCULAR LENS  IMPLANT, BILATERAL Bilateral   . CERVICAL SPINE SURGERY  06/02/2010   lower back and neck  . COLONOSCOPY N/A 03/14/2018   Procedure: COLONOSCOPY;  Surgeon: Ronnette Juniper, MD;  Location: [facility];  Service: Gastroenterology;  Laterality: N/A;  . COLONOSCOPY WITH PROPOFOL N/A 12/29/2014   Procedure: COLONOSCOPY WITH PROPOFOL;  Surgeon: [place] Fair, MD;  Location: [facility];  Service: Endoscopy;  Laterality: N/A;  . CORONARY ANGIOPLASTY WITH STENT PLACEMENT  October 2012  . CORONARY STENT PLACEMENT  Sept 2012   2nd OM with BMS  . ESOPHAGOGASTRODUODENOSCOPY N/A 03/14/2018   Procedure: ESOPHAGOGASTRODUODENOSCOPY (EGD);  Surgeon: Ronnette Juniper, MD;  Location[facility];  Service: Gastroenterology;  Laterality: N/A;  . HEMORROIDECTOMY    . LAMINECTOMY  05/30/2012   L 4 L5  . LAMINECTOMY WITH POSTERIOR LATERAL ARTHRODESIS LEVEL 3 N/A 10/18/2016   Procedure: Posterior Lateral Fusion - Lumbar One-Four, segmental instrumentation Lumbar One-Five,  decompression,;  Surgeon: Eustace Moore, MD;  Location[facility] OR;  Service: Neurosurgery;  Laterality: N/A;  . LAPAROSCOPIC CHOLECYSTECTOMY    . LAPAROSCOPIC GASTRIC BANDING    . LEFT AND RIGHT HEART CATHETERIZATION WITH CORONARY ANGIOGRAM N/A 05/07/2014   Procedure: LEFT AND RIGHT HEART CATHETERIZATION WITH CORONARY ANGIOGRAM;  Surgeon: [name] Martinique, MD;  Location: [facility] CATH LAB;  Service: Cardiovascular;  Laterality: N/A;  . LEFT HEART CATH AND CORONARY ANGIOGRAPHY N/A 03/13/2017   Procedure: LEFT HEART CATH AND CORONARY ANGIOGRAPHY;  Surgeon: Martinique, [name], MD;  Location: [place]  CV LAB;  Service: Cardiovascular;  Laterality: N/A;  . LUMBAR LAMINECTOMY/DECOMPRESSION MICRODISCECTOMY N/A 05/04/2016   Procedure: Laminectomy and Foraminotomy - Thoracic  twelve-Lumbar one -Posterior Fusion Lumbar one-two;  Surgeon: Eustace Moore, MD;  Location: [place];  Service: Neurosurgery;  Laterality: N/A;  . POLYPECTOMY  03/14/2018   Procedure: POLYPECTOMY;  Surgeon: Ronnette Juniper, MD;  Location: [facility];  Service: Gastroenterology;;  . POSTERIOR LUMBAR FUSION  10/18/2016  . SHOULDER OPEN ROTATOR CUFF REPAIR Bilateral   . TONSILLECTOMY AND ADENOIDECTOMY    . TOTAL HIP ARTHROPLASTY Right 10/18/2018   Procedure: RIGHT TOTAL HIP ARTHROPLASTY ANTERIOR APPROACH;  Surgeon: Mcarthur Rossetti, MD;  Location: [facility];  Service: Orthopedics;  Laterality: Right;  . TRIGGER FINGER RELEASE     LEFT  . UVULOPALATOPHARYNGOPLASTY    . VASECTOMY     Social History   Occupational History  . Occupation: Retired from Scientist, clinical (histocompatibility and immunogenetics): RETIRED  Tobacco Use  . Smoking status: Former Smoker    Packs/day: 1.50    Years: 30.00    Pack years: 45.00    Quit date: 01/24/1992    Years since quitting: 27.4  . Smokeless tobacco: Never Used  Substance and Sexual Activity  . Alcohol use: No  . Drug use: No  . Sexual activity: Not Currently

## 2019-08-16 ENCOUNTER — Other Ambulatory Visit: Payer: Self-pay | Admitting: Cardiology

## 2019-08-16 DIAGNOSIS — I10 Essential (primary) hypertension: Secondary | ICD-10-CM | POA: Diagnosis not present

## 2019-08-16 DIAGNOSIS — E119 Type 2 diabetes mellitus without complications: Secondary | ICD-10-CM | POA: Diagnosis not present

## 2019-08-16 DIAGNOSIS — N400 Enlarged prostate without lower urinary tract symptoms: Secondary | ICD-10-CM | POA: Diagnosis not present

## 2019-08-16 DIAGNOSIS — I480 Paroxysmal atrial fibrillation: Secondary | ICD-10-CM | POA: Diagnosis not present

## 2019-08-16 DIAGNOSIS — I5030 Unspecified diastolic (congestive) heart failure: Secondary | ICD-10-CM | POA: Diagnosis not present

## 2019-08-16 DIAGNOSIS — J4530 Mild persistent asthma, uncomplicated: Secondary | ICD-10-CM | POA: Diagnosis not present

## 2019-08-16 DIAGNOSIS — E0822 Diabetes mellitus due to underlying condition with diabetic chronic kidney disease: Secondary | ICD-10-CM | POA: Diagnosis not present

## 2019-08-16 DIAGNOSIS — C61 Malignant neoplasm of prostate: Secondary | ICD-10-CM | POA: Diagnosis not present

## 2019-08-16 DIAGNOSIS — E785 Hyperlipidemia, unspecified: Secondary | ICD-10-CM | POA: Diagnosis not present

## 2019-08-16 DIAGNOSIS — I2510 Atherosclerotic heart disease of native coronary artery without angina pectoris: Secondary | ICD-10-CM | POA: Diagnosis not present

## 2019-08-16 DIAGNOSIS — N183 Chronic kidney disease, stage 3 unspecified: Secondary | ICD-10-CM | POA: Diagnosis not present

## 2019-08-16 DIAGNOSIS — D649 Anemia, unspecified: Secondary | ICD-10-CM | POA: Diagnosis not present

## 2019-09-02 DIAGNOSIS — M533 Sacrococcygeal disorders, not elsewhere classified: Secondary | ICD-10-CM | POA: Diagnosis not present

## 2019-09-16 DIAGNOSIS — M545 Low back pain: Secondary | ICD-10-CM | POA: Diagnosis not present

## 2019-10-02 DIAGNOSIS — M961 Postlaminectomy syndrome, not elsewhere classified: Secondary | ICD-10-CM | POA: Diagnosis not present

## 2019-10-02 DIAGNOSIS — M5417 Radiculopathy, lumbosacral region: Secondary | ICD-10-CM | POA: Diagnosis not present

## 2019-10-02 DIAGNOSIS — Z5181 Encounter for therapeutic drug level monitoring: Secondary | ICD-10-CM | POA: Diagnosis not present

## 2019-10-02 DIAGNOSIS — Z79899 Other long term (current) drug therapy: Secondary | ICD-10-CM | POA: Diagnosis not present

## 2019-10-13 DIAGNOSIS — M5417 Radiculopathy, lumbosacral region: Secondary | ICD-10-CM | POA: Diagnosis not present

## 2019-10-22 DIAGNOSIS — I4891 Unspecified atrial fibrillation: Secondary | ICD-10-CM | POA: Diagnosis not present

## 2019-10-22 DIAGNOSIS — I480 Paroxysmal atrial fibrillation: Secondary | ICD-10-CM | POA: Diagnosis not present

## 2019-10-22 DIAGNOSIS — J4530 Mild persistent asthma, uncomplicated: Secondary | ICD-10-CM | POA: Diagnosis not present

## 2019-10-22 DIAGNOSIS — D509 Iron deficiency anemia, unspecified: Secondary | ICD-10-CM | POA: Diagnosis not present

## 2019-10-22 DIAGNOSIS — I509 Heart failure, unspecified: Secondary | ICD-10-CM | POA: Diagnosis not present

## 2019-10-22 DIAGNOSIS — I2510 Atherosclerotic heart disease of native coronary artery without angina pectoris: Secondary | ICD-10-CM | POA: Diagnosis not present

## 2019-10-22 DIAGNOSIS — I5030 Unspecified diastolic (congestive) heart failure: Secondary | ICD-10-CM | POA: Diagnosis not present

## 2019-10-22 DIAGNOSIS — C61 Malignant neoplasm of prostate: Secondary | ICD-10-CM | POA: Diagnosis not present

## 2019-10-22 DIAGNOSIS — I10 Essential (primary) hypertension: Secondary | ICD-10-CM | POA: Diagnosis not present

## 2019-10-22 DIAGNOSIS — D649 Anemia, unspecified: Secondary | ICD-10-CM | POA: Diagnosis not present

## 2019-10-22 DIAGNOSIS — E119 Type 2 diabetes mellitus without complications: Secondary | ICD-10-CM | POA: Diagnosis not present

## 2019-10-22 DIAGNOSIS — J45909 Unspecified asthma, uncomplicated: Secondary | ICD-10-CM | POA: Diagnosis not present

## 2019-10-23 ENCOUNTER — Telehealth: Payer: Self-pay | Admitting: *Deleted

## 2019-10-23 NOTE — Telephone Encounter (Signed)
1 year Ortho bundle call completed. See Cyndee Brightly.

## 2019-10-27 ENCOUNTER — Other Ambulatory Visit: Payer: Self-pay | Admitting: Cardiology

## 2019-10-27 DIAGNOSIS — Z0001 Encounter for general adult medical examination with abnormal findings: Secondary | ICD-10-CM | POA: Diagnosis not present

## 2019-10-27 DIAGNOSIS — E785 Hyperlipidemia, unspecified: Secondary | ICD-10-CM | POA: Diagnosis not present

## 2019-10-27 DIAGNOSIS — I10 Essential (primary) hypertension: Secondary | ICD-10-CM | POA: Diagnosis not present

## 2019-10-27 DIAGNOSIS — N1831 Chronic kidney disease, stage 3a: Secondary | ICD-10-CM | POA: Diagnosis not present

## 2019-10-27 DIAGNOSIS — I5030 Unspecified diastolic (congestive) heart failure: Secondary | ICD-10-CM | POA: Diagnosis not present

## 2019-10-27 DIAGNOSIS — E0822 Diabetes mellitus due to underlying condition with diabetic chronic kidney disease: Secondary | ICD-10-CM | POA: Diagnosis not present

## 2019-10-27 DIAGNOSIS — Z23 Encounter for immunization: Secondary | ICD-10-CM | POA: Diagnosis not present

## 2019-10-27 DIAGNOSIS — I2510 Atherosclerotic heart disease of native coronary artery without angina pectoris: Secondary | ICD-10-CM | POA: Diagnosis not present

## 2019-10-27 DIAGNOSIS — K219 Gastro-esophageal reflux disease without esophagitis: Secondary | ICD-10-CM | POA: Diagnosis not present

## 2019-10-27 DIAGNOSIS — E559 Vitamin D deficiency, unspecified: Secondary | ICD-10-CM | POA: Diagnosis not present

## 2019-10-27 DIAGNOSIS — J4530 Mild persistent asthma, uncomplicated: Secondary | ICD-10-CM | POA: Diagnosis not present

## 2019-10-27 DIAGNOSIS — I480 Paroxysmal atrial fibrillation: Secondary | ICD-10-CM | POA: Diagnosis not present

## 2019-10-28 DIAGNOSIS — E559 Vitamin D deficiency, unspecified: Secondary | ICD-10-CM | POA: Diagnosis not present

## 2019-10-28 DIAGNOSIS — E785 Hyperlipidemia, unspecified: Secondary | ICD-10-CM | POA: Diagnosis not present

## 2019-10-28 DIAGNOSIS — D6869 Other thrombophilia: Secondary | ICD-10-CM | POA: Diagnosis not present

## 2019-10-28 DIAGNOSIS — I10 Essential (primary) hypertension: Secondary | ICD-10-CM | POA: Diagnosis not present

## 2019-10-28 DIAGNOSIS — E1165 Type 2 diabetes mellitus with hyperglycemia: Secondary | ICD-10-CM | POA: Diagnosis not present

## 2019-10-28 DIAGNOSIS — I480 Paroxysmal atrial fibrillation: Secondary | ICD-10-CM | POA: Diagnosis not present

## 2019-10-28 DIAGNOSIS — I5030 Unspecified diastolic (congestive) heart failure: Secondary | ICD-10-CM | POA: Diagnosis not present

## 2019-10-28 DIAGNOSIS — D509 Iron deficiency anemia, unspecified: Secondary | ICD-10-CM | POA: Diagnosis not present

## 2019-10-28 DIAGNOSIS — E0822 Diabetes mellitus due to underlying condition with diabetic chronic kidney disease: Secondary | ICD-10-CM | POA: Diagnosis not present

## 2019-10-31 ENCOUNTER — Other Ambulatory Visit: Payer: Self-pay | Admitting: Cardiology

## 2019-11-03 DIAGNOSIS — I5030 Unspecified diastolic (congestive) heart failure: Secondary | ICD-10-CM | POA: Diagnosis not present

## 2019-11-03 DIAGNOSIS — N183 Chronic kidney disease, stage 3 unspecified: Secondary | ICD-10-CM | POA: Diagnosis not present

## 2019-11-03 DIAGNOSIS — I480 Paroxysmal atrial fibrillation: Secondary | ICD-10-CM | POA: Diagnosis not present

## 2019-11-03 DIAGNOSIS — N400 Enlarged prostate without lower urinary tract symptoms: Secondary | ICD-10-CM | POA: Diagnosis not present

## 2019-11-03 DIAGNOSIS — I2510 Atherosclerotic heart disease of native coronary artery without angina pectoris: Secondary | ICD-10-CM | POA: Diagnosis not present

## 2019-11-03 DIAGNOSIS — I10 Essential (primary) hypertension: Secondary | ICD-10-CM | POA: Diagnosis not present

## 2019-11-03 DIAGNOSIS — I509 Heart failure, unspecified: Secondary | ICD-10-CM | POA: Diagnosis not present

## 2019-11-03 DIAGNOSIS — D509 Iron deficiency anemia, unspecified: Secondary | ICD-10-CM | POA: Diagnosis not present

## 2019-11-03 DIAGNOSIS — C61 Malignant neoplasm of prostate: Secondary | ICD-10-CM | POA: Diagnosis not present

## 2019-11-03 DIAGNOSIS — E785 Hyperlipidemia, unspecified: Secondary | ICD-10-CM | POA: Diagnosis not present

## 2019-11-03 DIAGNOSIS — J4530 Mild persistent asthma, uncomplicated: Secondary | ICD-10-CM | POA: Diagnosis not present

## 2019-11-03 DIAGNOSIS — E0822 Diabetes mellitus due to underlying condition with diabetic chronic kidney disease: Secondary | ICD-10-CM | POA: Diagnosis not present

## 2019-11-11 DIAGNOSIS — M5417 Radiculopathy, lumbosacral region: Secondary | ICD-10-CM | POA: Diagnosis not present

## 2019-11-11 DIAGNOSIS — M961 Postlaminectomy syndrome, not elsewhere classified: Secondary | ICD-10-CM | POA: Diagnosis not present

## 2019-11-13 ENCOUNTER — Other Ambulatory Visit: Payer: Self-pay | Admitting: Physician Assistant

## 2019-11-13 ENCOUNTER — Other Ambulatory Visit: Payer: Self-pay

## 2019-11-13 DIAGNOSIS — M961 Postlaminectomy syndrome, not elsewhere classified: Secondary | ICD-10-CM

## 2019-11-24 ENCOUNTER — Ambulatory Visit (HOSPITAL_COMMUNITY)
Admission: RE | Admit: 2019-11-24 | Discharge: 2019-11-24 | Disposition: A | Discharge status: Home / Self Care | Payer: Medicare Other | Source: Ambulatory Visit | Attending: Nurse Practitioner | Admitting: Nurse Practitioner

## 2019-11-24 ENCOUNTER — Other Ambulatory Visit: Payer: Self-pay

## 2019-11-24 ENCOUNTER — Encounter (HOSPITAL_COMMUNITY): Payer: Self-pay | Admitting: Nurse Practitioner

## 2019-11-24 VITALS — BP 122/60 | HR 61 | Ht 65.0 in | Wt 243.8 lb

## 2019-11-24 DIAGNOSIS — I480 Paroxysmal atrial fibrillation: Secondary | ICD-10-CM

## 2019-11-24 DIAGNOSIS — D6869 Other thrombophilia: Secondary | ICD-10-CM

## 2019-11-24 DIAGNOSIS — I10 Essential (primary) hypertension: Secondary | ICD-10-CM | POA: Insufficient documentation

## 2019-11-24 DIAGNOSIS — I4892 Unspecified atrial flutter: Secondary | ICD-10-CM | POA: Insufficient documentation

## 2019-11-24 DIAGNOSIS — Z888 Allergy status to other drugs, medicaments and biological substances status: Secondary | ICD-10-CM | POA: Insufficient documentation

## 2019-11-24 DIAGNOSIS — Z7901 Long term (current) use of anticoagulants: Secondary | ICD-10-CM | POA: Insufficient documentation

## 2019-11-24 DIAGNOSIS — Z880 Allergy status to penicillin: Secondary | ICD-10-CM | POA: Insufficient documentation

## 2019-11-24 DIAGNOSIS — I4819 Other persistent atrial fibrillation: Secondary | ICD-10-CM | POA: Insufficient documentation

## 2019-11-24 DIAGNOSIS — Z885 Allergy status to narcotic agent status: Secondary | ICD-10-CM | POA: Diagnosis not present

## 2019-11-24 DIAGNOSIS — Z79899 Other long term (current) drug therapy: Secondary | ICD-10-CM | POA: Diagnosis not present

## 2019-11-24 DIAGNOSIS — Z87891 Personal history of nicotine dependence: Secondary | ICD-10-CM | POA: Diagnosis not present

## 2019-11-24 DIAGNOSIS — Z8249 Family history of ischemic heart disease and other diseases of the circulatory system: Secondary | ICD-10-CM | POA: Diagnosis not present

## 2019-11-24 LAB — BASIC METABOLIC PANEL
Anion gap: 9 (ref 5–15)
BUN: 25 mg/dL — ABNORMAL HIGH (ref 8–23)
CO2: 22 mmol/L (ref 22–32)
Calcium: 8.7 mg/dL — ABNORMAL LOW (ref 8.9–10.3)
Chloride: 107 mmol/L (ref 98–111)
Creatinine, Ser: 1.11 mg/dL (ref 0.61–1.24)
GFR, Estimated: >60 mL/min (ref >60)
Glucose, Bld: 133 mg/dL — ABNORMAL HIGH (ref 70–99)
Potassium: 3.7 mmol/L (ref 3.5–5.1)
Sodium: 138 mmol/L (ref 135–145)

## 2019-11-24 NOTE — Progress Notes (Signed)
Primary Care Physician: Josetta Huddle, MD Referring Physician: Dr. Rubie Maid is a 79 y.o. male with a h/o  persistent afib/flutter with afib ablation  in 04/2017. He has no  complaints today except for cramps involving his hands and legs at times. He is on a diuretic as well as spironolactone as well as K+ supplement. His  PCP drew labs recently but did not draw a  bmet. Will draw as well  to make sure to check creatinine to make sure  he is on appropriate dose of xarelto.  He notes irregular heart beat on his watch at times but does not think he is in afib. EKG today  reads afib but it appears to be SR with q every 3rd  beat being a PAC.   Today, he denies symptoms of palpitations, chest pain, shortness of breath, orthopnea, PND, lower extremity edema, dizziness, presyncope, syncope, or neurologic sequela. The patient is tolerating medications without difficulties and is otherwise without complaint today.   Past Medical History:  Diagnosis Date  . Anticoagulant long-term use   . Aortic root enlargement ([place])   . Ascending aortic aneurysm [facility])    recent scan in October 2012 showing no change; followed by Dr. Servando Snare  . ASCVD (arteriosclerotic cardiovascular disease)    Prior BMS to the 2nd OM in September 2012; with repeat cath in October showing patency  . CAD (coronary artery disease)    a. s/p BMS to 2nd OM in Sept 2012; b. LexiScan Myoview (12/2012):  Inf infarct; bowel and motion artifact make study difficult to interpret; no ischemia; not gated; Low Risk  . CHF (congestive heart failure) ([place])    no recent issues 10/13/14  . Chronic back pain    "all over my back" (05/11/2017)  . Colonic polyp   . Contact lens/glasses fitting   . Diastolic dysfunction   . DVT (deep venous thrombosis) ([place])    ?LLE  . Frequent headaches    "probably weekly" (05/11/2017)  . Generalized headaches    neck stenosis  . GERD (gastroesophageal reflux disease)   . Hearing loss   .  Hearing loss    more so on left  . Hemorrhoids   . History of stomach ulcers   . Hypertension   . IBS (irritable bowel syndrome)   . LVH (left ventricular hypertrophy)   . Mild intermittent asthma   . OA (osteoarthritis)    "all over" (05/11/2017)  . Obesities, morbid ([place])   . OSA (obstructive sleep apnea)    PSG 03/30/97 AHI 21, BPAP 13/9  . OSA on CPAP   . PAF (paroxysmal atrial fibrillation) ([place])    a. on Xarelto b. s/p DCCV in 08/2016; b. Tikosyn failed 04/16/17 with plans for Multaq and possible Afib ablation with Dr. Rayann Heman  . Pneumonia    'several times" (05/11/2017)  . Prostate CA [facility])    Oncologist  DR. Daral[facility] Milch [facility] 09/24/14, undetermined tx   prostate; S/P "radiation and hormone injections"  . Pulmonary embolism ([place]) 2008   "both lungs"  . SOB (shortness of breath)    on excertion  . Thoracic aortic aneurysm ([facility])    Aortic Size Index=     5.0    /Body surface area is 2.43 meters squared. = 2.05  < 2.75 cm/m2      4% risk per year 2.75 to 4.25          8% risk per year > 4.25 cm/m2  20% risk per year   Stable aneurysmal dilation of the ascending aorta with maximum AP diameter of 4.8 cm. Stable area of narrowing of the proximal most portion of the descending aorta measuring 2 cm., previously identified as an area of coarctation. No evidence of aortic dissection.  Coronary artery disease.  Normal appearance of the lungs.   Electronically Signed   By: Fidela Salisbury M.D.   On: 10/01/2014 08:50    . Type II diabetes mellitus ([place])    metphormin, average 154 dx 2017   Past Surgical History:  Procedure Laterality Date  . ACHILLES TENDON REPAIR Bilateral   . AORTIC ARCH ANGIOGRAPHY N/A 03/13/2017   Procedure: AORTIC ARCH ANGIOGRAPHY;  Surgeon: Martinique, [name], MD;  Location: [place] [facility];  Service: Cardiovascular;  Laterality: N/A;  . APPENDECTOMY    . ATRIAL FIBRILLATION ABLATION  05/11/2017  . ATRIAL FIBRILLATION ABLATION N/A 05/11/2017   Procedure: ATRIAL  FIBRILLATION ABLATION;  Surgeon: [name], MD;  Location: [place] [facility];  Service: Cardiovascular;  Laterality: N/A;  . BACK SURGERY     "I've had 7 back and 1 neck ORs" (05/11/2017)  . BIOPSY  03/14/2018   Procedure: BIOPSY;  Surgeon: Ronnette Juniper, MD;  Location: [facility];  Service: Gastroenterology;;  EGD and Colon  . CARDIAC CATHETERIZATION  2006  . CARPAL TUNNEL RELEASE Bilateral    LEFT  . CATARACT EXTRACTION W/ INTRAOCULAR LENS  IMPLANT, BILATERAL Bilateral   . CERVICAL SPINE SURGERY  06/02/2010   lower back and neck  . COLONOSCOPY N/A 03/14/2018   Procedure: COLONOSCOPY;  Surgeon: Ronnette Juniper, MD;  Location: [facility];  Service: Gastroenterology;  Laterality: N/A;  . COLONOSCOPY WITH PROPOFOL N/A 12/29/2014   Procedure: COLONOSCOPY WITH PROPOFOL;  Surgeon: Garlan Fair, MD;  Location: [facility];  Service: Endoscopy;  Laterality: N/A;  . CORONARY ANGIOPLASTY WITH STENT PLACEMENT  October 2012  . CORONARY STENT PLACEMENT  Sept 2012   2nd OM with BMS  . ESOPHAGOGASTRODUODENOSCOPY N/A 03/14/2018   Procedure: ESOPHAGOGASTRODUODENOSCOPY (EGD);  Surgeon: Ronnette Juniper, MD;  Location: [facility];  Service: Gastroenterology;  Laterality: N/A;  . HEMORROIDECTOMY    . LAMINECTOMY  05/30/2012   L 4 L5  . LAMINECTOMY WITH POSTERIOR LATERAL ARTHRODESIS LEVEL 3 N/A 10/18/2016   Procedure: Posterior Lateral Fusion - Lumbar One-Four, segmental instrumentation Lumbar One-Five,  decompression,;  Surgeon: Eustace Moore, MD;  Location: [facility] OR;  Service: Neurosurgery;  Laterality: N/A;  . LAPAROSCOPIC CHOLECYSTECTOMY    . LAPAROSCOPIC GASTRIC BANDING    . LEFT AND RIGHT HEART CATHETERIZATION WITH CORONARY ANGIOGRAM N/A 05/07/2014   Procedure: LEFT AND RIGHT HEART CATHETERIZATION WITH CORONARY ANGIOGRAM;  Surgeon: [name] Martinique, MD;  Location: [facility] CATH LAB;  Service: Cardiovascular;  Laterality: N/A;  . LEFT HEART CATH AND CORONARY ANGIOGRAPHY N/A 03/13/2017   Procedure: LEFT HEART CATH  AND CORONARY ANGIOGRAPHY;  Surgeon: Martinique, [name], MD;  Location: [place] [facility];  Service: Cardiovascular;  Laterality: N/A;  . LUMBAR LAMINECTOMY/DECOMPRESSION MICRODISCECTOMY N/A 05/04/2016   Procedure: Laminectomy and Foraminotomy - Thoracic twelve-Lumbar one -Posterior Fusion Lumbar one-two;  Surgeon: Eustace Moore, MD;  Location: [place];  Service: Neurosurgery;  Laterality: N/A;  . POLYPECTOMY  03/14/2018   Procedure: POLYPECTOMY;  Surgeon: Ronnette Juniper, MD;  Location: [facility];  Service: Gastroenterology;;  . POSTERIOR LUMBAR FUSION  10/18/2016  . SHOULDER OPEN ROTATOR CUFF REPAIR Bilateral   . TONSILLECTOMY AND ADENOIDECTOMY    . TOTAL HIP ARTHROPLASTY  Right 10/18/2018   Procedure: RIGHT TOTAL HIP ARTHROPLASTY ANTERIOR APPROACH;  Surgeon: Mcarthur Rossetti, MD;  Location: [facility] ORS;  Service: Orthopedics;  Laterality: Right;  . TRIGGER FINGER RELEASE     LEFT  . UVULOPALATOPHARYNGOPLASTY    . VASECTOMY      Current Outpatient Medications  Medication Sig Dispense Refill  . acetaminophen (TYLENOL) 500 MG tablet Take 1,000 mg by mouth every 8 (eight) hours as needed for mild pain or headache.     . albuterol (PROVENTIL HFA;VENTOLIN HFA) 108 (90 Base) MCG/ACT inhaler Inhale 2 puffs into the lungs every 4 (four) hours as needed for wheezing or shortness of breath.    Marland Kitchen atorvastatin (LIPITOR) 10 MG tablet Take 5 mg by mouth daily at 6 PM.     . augmented betamethasone dipropionate (DIPROLENE-AF) 0.05 % cream Apply 1 application topically 2 (two) times daily as needed for itching.  3  . budesonide-formoterol (SYMBICORT) 80-4.5 MCG/ACT inhaler Inhale 2 puffs into the lungs 2 (two) times daily as needed (for shortness of breath).     . celecoxib (CELEBREX) 200 MG capsule Take 200 mg by mouth 2 (two) times daily as needed for moderate pain.  0  . Cholecalciferol (VITAMIN D-3) 5000 units TABS Take 5,000 Units by mouth daily.    . Coenzyme Q10 100 MG capsule Take 100 mg by mouth daily.      . furosemide (LASIX) 80 MG tablet Take 80 mg by mouth daily. Take 80 mg in the morning     . JARDIANCE 25 MG TABS tablet Take 1 tablet by mouth daily.    Marland Kitchen KLOR-CON M20 20 MEQ tablet TAKE 2 TABLETS BY MOUTH TWICE A DAY 60 tablet 1  . losartan (COZAAR) 25 MG tablet TAKE 1 TABLET BY MOUTH EVERY DAY 90 tablet 3  . methocarbamol (ROBAXIN) 500 MG tablet Take 1 tablet (500 mg total) by mouth every 8 (eight) hours as needed for muscle spasms. 60 tablet 1  . metoprolol tartrate (LOPRESSOR) 25 MG tablet Take 12.5 mg by mouth 2 (two) times daily.    . montelukast (SINGULAIR) 10 MG tablet Take 10 mg by mouth at bedtime.    . nitroGLYCERIN (NITROSTAT) 0.4 MG SL tablet Place 1 tablet (0.4 mg total) under the tongue every 5 (five) minutes as needed for chest pain. 25 tablet 5  . pantoprazole (PROTONIX) 20 MG tablet Take 20 mg by mouth daily.    Vladimir Faster Glycol-Propyl Glycol (SYSTANE OP) Place 1 drop into both eyes daily as needed (for dry eyes).     . psyllium (METAMUCIL) 58.6 % packet Take 1 packet by mouth daily.    . rivaroxaban (XARELTO) 20 MG TABS tablet Take 1 tablet (20 mg total) by mouth daily with supper. 30 tablet   . sildenafil (VIAGRA) 100 MG tablet Take 100 mg by mouth as needed for erectile dysfunction.     . Skin Protectants, Misc. (EUCERIN) cream Apply 1 application topically as needed for dry skin.    Marland Kitchen spironolactone (ALDACTONE) 25 MG tablet Take 25 mg by mouth daily.    . tamsulosin (FLOMAX) 0.4 MG CAPS Take 0.4 mg by mouth every evening.     . topiramate (TOPAMAX) 25 MG capsule Take 50 mg by mouth 2 (two) times daily.     . traMADol (ULTRAM) 50 MG tablet Take by mouth.     No current facility-administered medications for this encounter.    Allergies  Allergen Reactions  . Ace Inhibitors Cough  .  Amoxicillin-Pot Clavulanate     Other reaction(s): GI Upset (intolerance)  . Quinolones     Patient was warned about not using Cipro and similar antibiotics. Recent studies have  raised concern that fluoroquinolone antibiotics could be associated with an increased risk of aortic aneurysm Fluoroquinolones have non-antimicrobial properties that might jeopardise the integrity of the extracellular matrix of the vascular wall In a  propensity score matched cohort study in [place], there was a 66% increased rate of aortic aneurysm or dissection associated with oral fluoroquinolone use, compared wit  . Adhesive [Tape] Itching and Rash  . Latex Itching, Rash and Other (See Comments)    Bandaids  . Metformin Diarrhea  . Morphine Itching    Social History   Socioeconomic History  . Marital status: Married    Spouse name: Not on file  . Number of children: 3  . Years of education: Not on file  . Highest education level: Not on file  Occupational History  . Occupation: Retired from Scientist, clinical (histocompatibility and immunogenetics): RETIRED  Tobacco Use  . Smoking status: Former Smoker    Packs/day: 1.50    Years: 30.00    Pack years: 45.00    Quit date: 01/24/1992    Years since quitting: 27.8  . Smokeless tobacco: Never Used  Vaping Use  . Vaping Use: Never used  Substance and Sexual Activity  . Alcohol use: No  . Drug use: No  . Sexual activity: Not Currently  Other Topics Concern  . Not on file  Social History Narrative  . Not on file   Social Determinants of Health   Financial Resource Strain:   . Difficulty of Paying Living Expenses: Not on file  Food Insecurity:   . Worried About Charity fundraiser in the Last Year: Not on file  . Ran Out of Food in the Last Year: Not on file  Transportation Needs:   . Lack of Transportation (Medical): Not on file  . Lack of Transportation (Non-Medical): Not on file  Physical Activity:   . Days of Exercise per Week: Not on file  . Minutes of Exercise per Session: Not on file  Stress:   . Feeling of Stress : Not on file  Social Connections:   . Frequency of Communication with Friends and Family: Not on file  . Frequency of Social Gatherings  with Friends and Family: Not on file  . Attends Religious Services: Not on file  . Active Member of Clubs or Organizations: Not on file  . Attends Archivist Meetings: Not on file  . Marital Status: Not on file  Intimate Partner Violence:   . Fear of Current or Ex-Partner: Not on file  . Emotionally Abused: Not on file  . Physically Abused: Not on file  . Sexually Abused: Not on file    Family History  Problem Relation Age of Onset  . Heart disease Mother   . Diabetes Mother   . Other Mother        stent placement  . Emphysema Father 83  . Heart attack Sister     ROS- All systems are reviewed and negative except as per the HPI above  Physical Exam: Vitals:   11/24/19 0917  BP: 122/60  Pulse: 61  Weight: 110.6 kg  Height: 5\' 5"  (1.651 m)   Wt Readings from Last 3 Encounters:  11/24/19 110.6 kg  05/23/19 108 kg  04/24/19 108 kg    Labs: Lab Results  Component Value Date  NA 137 10/21/2018   K 4.1 10/21/2018   CL 108 10/21/2018   CO2 20 (L) 10/21/2018   GLUCOSE 143 (H) 10/21/2018   BUN 21 10/21/2018   CREATININE 0.87 10/21/2018   CALCIUM 8.3 (L) 10/21/2018   MG 2.2 04/18/2017   Lab Results  Component Value Date   INR 1.3 (H) 03/08/2017   Lab Results  Component Value Date   CHOL 118 01/07/2013   HDL 40 01/07/2013   LDLCALC 51 01/07/2013   TRIG 133 01/07/2013     GEN- The patient is well appearing, alert and oriented x 3 today.   Head- normocephalic, atraumatic Eyes-  Sclera clear, conjunctiva pink Ears- hearing intact Oropharynx- clear Neck- supple, no JVP Lymph- no cervical lymphadenopathy Lungs- Clear to ausculation bilaterally, normal work of breathing Heart- Regular rate and rhythm, no murmurs, rubs or gallops, PMI not laterally displaced GI- soft, NT, ND, + BS Extremities- no clubbing, cyanosis, or edema MS- no significant deformity or atrophy Skin- no rash or lesion Psych- euthymic mood, full affect Neuro- strength and  sensation are intact  EKG-SR with first degree AV block with q 3rd beat a PAC at 61 bpm Epic records reviewed    Assessment and Plan: 1. Persistent  afib He appears to be maintaining SR although today PAC's are noted Continue  metoprolol tartrate at 12.5 mg bid   2. CHA2DS2VASc score of 6 Continue  xarelto 20 mg daily bmet today  Reviewed his last CBC from  PCP,  mildly anemic    3. HTN Stable No  change   F/u with Dr. Rayann Heman per recall in May  2022  Butch Penny C. Ovide Dusek, [facility] [address] [place], [place] [address] [phone]

## 2019-11-26 DIAGNOSIS — M791 Myalgia, unspecified site: Secondary | ICD-10-CM | POA: Diagnosis not present

## 2019-11-26 DIAGNOSIS — I889 Nonspecific lymphadenitis, unspecified: Secondary | ICD-10-CM | POA: Diagnosis not present

## 2019-11-26 DIAGNOSIS — R509 Fever, unspecified: Secondary | ICD-10-CM | POA: Diagnosis not present

## 2019-11-26 DIAGNOSIS — R1030 Lower abdominal pain, unspecified: Secondary | ICD-10-CM | POA: Diagnosis not present

## 2019-11-27 ENCOUNTER — Encounter (HOSPITAL_COMMUNITY): Payer: Self-pay | Admitting: Nurse Practitioner

## 2019-11-27 NOTE — Addendum Note (Signed)
Encounter addended by: Sherran Needs, NP on: 11/27/2019 10:04 AM  Actions taken: Medication List reviewed, Problem List reviewed, Allergies reviewed, Level of Service modified, Visit diagnoses modified

## 2019-12-01 ENCOUNTER — Ambulatory Visit
Admission: RE | Admit: 2019-12-01 | Discharge: 2019-12-01 | Disposition: A | Discharge status: Home / Self Care | Payer: Medicare Other | Source: Ambulatory Visit | Attending: Physician Assistant | Admitting: Physician Assistant

## 2019-12-01 ENCOUNTER — Other Ambulatory Visit: Payer: Self-pay

## 2019-12-01 DIAGNOSIS — M4804 Spinal stenosis, thoracic region: Secondary | ICD-10-CM | POA: Diagnosis not present

## 2019-12-01 DIAGNOSIS — R972 Elevated prostate specific antigen [PSA]: Secondary | ICD-10-CM | POA: Diagnosis not present

## 2019-12-01 DIAGNOSIS — N529 Male erectile dysfunction, unspecified: Secondary | ICD-10-CM | POA: Diagnosis not present

## 2019-12-01 DIAGNOSIS — Z4651 Encounter for fitting and adjustment of gastric lap band: Secondary | ICD-10-CM | POA: Diagnosis not present

## 2019-12-01 DIAGNOSIS — C61 Malignant neoplasm of prostate: Secondary | ICD-10-CM | POA: Diagnosis not present

## 2019-12-01 DIAGNOSIS — M961 Postlaminectomy syndrome, not elsewhere classified: Secondary | ICD-10-CM

## 2019-12-01 DIAGNOSIS — N3941 Urge incontinence: Secondary | ICD-10-CM | POA: Diagnosis not present

## 2019-12-02 NOTE — Progress Notes (Signed)
Cardiology Office Note    Date:  12/04/2019   ID:  Carlos Dawson, DOB 1940/09/17, MRN [id number]  PCP:  Josetta Huddle, MD  Cardiologist: Dr. Alasha Mcguinness [place]   Chief Complaint  Patient presents with  . Follow-up    2 years.  . Edema    Ankles.    History of Present Illness:    Carlos Dawson is a 79 y.o. male with past medical history of CAD (s/p BMS to 2nd OM in 2012, low-risk NST in 12/2012), PAF (on Xarelto), chronic diastolic CHF, prior PE, HTN, HLD, Type 2 DM, and known thoracic aortic aneurysm who is seen for follow.  He was admitted from 8/23 - 09/15/2016 for evaluation of chest discomfort and palpitations. His initial EKG showed that he was in atrial fibrillation with RVR. He reported good compliance with his Xarelto and denied missing any doses, therefore a successful DCCV was performed in the ED at [place].  He became hypotensive with SBP in the 80's following the procedure, therefore he was admitted overnight for further observation. His Losartan, Spironolactone, and Lasix were held at the time of discharge with plans to resume at follow-up if BP allowed. A CTA was also obtained during admission to rule out a recurrent PE and was negative for a PE but did show a stable 5.0 cm ascending thoracic aortic aneurysm. Antihypertensive therapy was resumed. On 10/20/16 he underwent L1-5 fusion by Dr. Ronnald Ramp.   On follow up in February 2019   he noted increased symptoms of dyspnea on exertion and chest pressure for 3 months. Some improvement with inhalers.  No increase in edema or weight.  Oxygen levels were good. He does use CPAP. Myoview study was mildly abnormal with inferoapical reversible defect. EF 61%. Echo showed normal EF with moderate pulmonary HTN. Subsequent cardiac cath showed no obstructive CAD and no AI. Unable to cross AV due to distortion of the aortic root.  This spring he developed increased symptoms of arrhythmia despite Tikosyn. He underwent EPS by Dr. Rayann Heman on 05/11/17.  He had Afib and atrial flutter ablations. When seen by Dr. Rayann Heman on July 29 he had significant improvement in Afib symptoms and some improvement in dyspnea. Multaq was discontinued. Metoprolol was decreased. HR will go up with exercise to 110-130.   On 09/26/17 he had CPX to evaluate his dyspnea. This showed predominant limitation by obesity and deconditioning. He was seen by Dr. Halford Chessman who recommended repeat Echo and if pulmonary pressures still elevated to consider right heart cath. Of note right heart cath in 2016 demonstrated mild pulmonary HTN with elevated PCWP c/w diastolic dysfunction. Diuretic therapy was increased at that time without significant clinical improvement.  In June he was admitted to the hospital at [facility] with intestinal blockage. This was managed medically and with NG tube. This resolved.  He underwent THR in September 2020. Post op course complicated by blood loss anemia. He did develop Afib with rates into the 120s. Since then follow up in the Afib clinic demonstrated return of NSR. Last seen in AFib clinic on 11/24/19 and in NSR with PACs.   On follow up today he is now limited by back problems and hasn't been walking much. He is followed at pain clinic in [facility] and is considering having a spinal stimulator placed.  Recent infection of a lymph node and is on doxycycline. Not aware of any Afib. Has mild ankle edema. Notes periodic cramps in back and side of legs and hands.  Past Medical History:  Diagnosis Date  . Anticoagulant long-term use   . Aortic root enlargement ([place])   . Ascending aortic aneurysm [facility])    recent scan in October 2012 showing no change; followed by Dr. Servando Snare  . ASCVD (arteriosclerotic cardiovascular disease)    Prior BMS to the 2nd OM in September 2012; with repeat cath in October showing patency  . CAD (coronary artery disease)    a. s/p BMS to 2nd OM in Sept 2012; b. LexiScan Myoview (12/2012):  Inf infarct; bowel and motion  artifact make study difficult to interpret; no ischemia; not gated; Low Risk  . CHF (congestive heart failure) ([place])    no recent issues 10/13/14  . Chronic back pain    "all over my back" (05/11/2017)  . Colonic polyp   . Contact lens/glasses fitting   . Diastolic dysfunction   . DVT (deep venous thrombosis) ([place])    ?LLE  . Frequent headaches    "probably weekly" (05/11/2017)  . Generalized headaches    neck stenosis  . GERD (gastroesophageal reflux disease)   . Hearing loss   . Hearing loss    more so on left  . Hemorrhoids   . History of stomach ulcers   . Hypertension   . IBS (irritable bowel syndrome)   . LVH (left ventricular hypertrophy)   . Mild intermittent asthma   . OA (osteoarthritis)    "all over" (05/11/2017)  . Obesities, morbid ([place])   . OSA (obstructive sleep apnea)    PSG 03/30/97 AHI 21, BPAP 13/9  . OSA on CPAP   . PAF (paroxysmal atrial fibrillation) ([place])    a. on Xarelto b. s/p DCCV in 08/2016; b. Tikosyn failed 04/16/17 with plans for Multaq and possible Afib ablation with Dr. Rayann Heman  . Pneumonia    'several times" (05/11/2017)  . Prostate CA [facility])    Oncologist  DR. Daralene Milch [facility] dx 09/24/14, undetermined tx   prostate; S/P "radiation and hormone injections"  . Pulmonary embolism ([place]) 2008   "both lungs"  . SOB (shortness of breath)    on excertion  . Thoracic aortic aneurysm (HCC)    Aortic Size Index=     5.0    /Body surface area is 2.43 meters squared. = 2.05  < 2.75 cm/m2      4% risk per year 2.75 to 4.25          8% risk per year > 4.25 cm/m2    20% risk per year   Stable aneurysmal dilation of the ascending aorta with maximum AP diameter of 4.8 cm. Stable area of narrowing of the proximal most portion of the descending aorta measuring 2 cm., previously identified as an area of coarctation. No evidence of aortic dissection.  Coronary artery disease.  Normal appearance of the lungs.   Electronically Signed   By: Fidela Salisbury M.D.   On:  10/01/2014 08:50    . Type II diabetes mellitus ([place])    metphormin, average 154 dx 2017    Past Surgical History:  Procedure Laterality Date  . ACHILLES TENDON REPAIR Bilateral   . AORTIC ARCH ANGIOGRAPHY N/A 03/13/2017   Procedure: AORTIC ARCH ANGIOGRAPHY;  Surgeon: [place], Jesaiah Fabiano M, MD;  Location: [facility];  Service: Cardiovascular;  Laterality: N/A;  . APPENDECTOMY    . ATRIAL FIBRILLATION ABLATION  05/11/2017  . ATRIAL FIBRILLATION ABLATION N/A 05/11/2017   Procedure: ATRIAL FIBRILLATION ABLATION;  Surgeon: [name], MD;  Location: [place]  CV LAB;  Service: Cardiovascular;  Laterality: N/A;  . BACK SURGERY     "I've had 7 back and 1 neck ORs" (05/11/2017)  . BIOPSY  03/14/2018   Procedure: BIOPSY;  Surgeon: Ronnette Juniper, MD;  Location: [facility] END[facility];  Service: Gastroenterology;;  EGD and Colon  . CARDIAC CATHETERIZATION  2006  . CARPAL TUNNEL RELEASE Bilateral    LEFT  . CATARACT EXTRACTION W/ INTRAOCULAR LENS  IMPLANT, BILATERAL Bilateral   . CERVICAL SPINE SURGERY  06/02/2010   lower back and neck  . COLONOSCOPY N/A 03/14/2018   Procedure: COLONOSCOPY;  Surgeon: Ronnette Juniper, MD;  [facility]: [facility];  Service: Gastroenterology;  Laterality: N/A;  . COLONOSCOPY WITH PROPOFOL N/A 12/29/2014   Procedure: COLONOSCOPY WITH PROPOFOL;  Surgeon: Garlan Fair, MD;  Location: [facility];  Service: Endoscopy;  Laterality: N/A;  . CORONARY ANGIOPLASTY WITH STENT PLACEMENT  October 2012  . CORONARY STENT PLACEMENT  Sept 2012   2nd OM with BMS  . ESOPHAGOGASTRODUODENOSCOPY N/A 03/14/2018   Procedure: ESOPHAGOGASTRODUODENOSCOPY (EGD);  Surgeon: Ronnette Juniper, MD;  Location: [facility];  Service: Gastroenterology;  Laterality: N/A;  . HEMORROIDECTOMY    . LAMINECTOMY  05/30/2012   L 4 L5  . LAMINECTOMY WITH POSTERIOR LATERAL ARTHRODESIS LEVEL 3 N/A 10/18/2016   Procedure: Posterior Lateral Fusion - Lumbar One-Four, segmental instrumentation Lumbar One-Five,  decompression,;   Surgeon: Eustace Moore, MD;  Location: [facility] OR;  Service: Neurosurgery;  Laterality: N/A;  . LAPAROSCOPIC CHOLECYSTECTOMY    . LAPAROSCOPIC GASTRIC BANDING    . LEFT AND RIGHT HEART CATHETERIZATION WITH CORONARY ANGIOGRAM N/A 05/07/2014   Procedure: LEFT AND RIGHT HEART CATHETERIZATION WITH CORONARY ANGIOGRAM;  Surgeon: Belenda Alviar M [place]ique, MD;  Location: [facility] CATH LAB;  Service: Cardiovascular;  Laterality: N/A;  . LEFT HEART CATH AND CORONARY ANGIOGRAPHY N/A 03/13/2017   Procedure: LEFT HEART CATH AND CORONARY ANGIOGRAPHY;  Surgeon: [place], Johnathan Heskett M, MD;  Location: [facility] CV LAB;  Service: Cardiovascular;  Laterality: N/A;  . LUMBAR LAMINECTOMY/DECOMPRESSION MICRODISCECTOMY N/A 05/04/2016   Procedure: Laminectomy and Foraminotomy - Thoracic twelve-Lumbar one -Posterior Fusion Lumbar one-two;  Surgeon: Eustace Moore, MD;  Location: [facility];  Service: Neurosurgery;  Laterality: N/A;  . POLYPECTOMY  03/14/2018   Procedure: POLYPECTOMY;  Surgeon: Ronnette Juniper, MD;  Location[facility];  Service: Gastroenterology;;  . POSTERIOR LUMBAR FUSION  10/18/2016  . SHOULDER OPEN ROTATOR CUFF REPAIR Bilateral   . TONSILLECTOMY AND ADENOIDECTOMY    . TOTAL HIP ARTHROPLASTY Right 10/18/2018   Procedure: RIGHT TOTAL HIP ARTHROPLASTY ANTERIOR APPROACH;  Surgeon: Mcarthur Rossetti, MD;  Location: [facility] ORS;  Service: Orthopedics;  Laterality: Right;  . TRIGGER FINGER RELEASE     LEFT  . UVULOPALATOPHARYNGOPLASTY    . VASECTOMY      Current Medications: Outpatient Medications Prior to Visit  Medication Sig Dispense Refill  . acetaminophen (TYLENOL) 500 MG tablet Take 1,000 mg by mouth every 8 (eight) hours as needed for mild pain or headache.     . albuterol (PROVENTIL HFA;VENTOLIN HFA) 108 (90 Base) MCG/ACT inhaler Inhale 2 puffs into the lungs every 4 (four) hours as needed for wheezing or shortness of breath.    Marland Kitchen atorvastatin (LIPITOR) 10 MG tablet Take 5 mg by mouth daily at 6 PM.     . augmented  betamethasone dipropionate (DIPROLENE-AF) 0.05 % cream Apply 1 application topically 2 (two) times daily as needed for itching.  3  . budesonide-formoterol (SYMBICORT) 80-4.5 MCG/ACT inhaler Inhale 2 puffs into  the lungs 2 (two) times daily as needed (for shortness of breath).     . celecoxib (CELEBREX) 200 MG capsule Take 200 mg by mouth 2 (two) times daily as needed for moderate pain.  0  . Cholecalciferol (VITAMIN D-3) 5000 units TABS Take 5,000 Units by mouth daily.    . Coenzyme Q10 100 MG capsule Take 100 mg by mouth daily.     . furosemide (LASIX) 80 MG tablet Take 80 mg by mouth daily. Take 80 mg in the morning     . JARDIANCE 25 MG TABS tablet Take 1 tablet by mouth daily.    Marland Kitchen KLOR-CON M20 20 MEQ tablet TAKE 2 TABLETS BY MOUTH TWICE A DAY (Patient taking differently: Take 60 mEq by mouth daily. ) 60 tablet 1  . losartan (COZAAR) 25 MG tablet TAKE 1 TABLET BY MOUTH EVERY DAY 90 tablet 3  . methocarbamol (ROBAXIN) 500 MG tablet Take 1 tablet (500 mg total) by mouth every 8 (eight) hours as needed for muscle spasms. 60 tablet 1  . metoprolol tartrate (LOPRESSOR) 25 MG tablet Take 12.5 mg by mouth 2 (two) times daily.    . montelukast (SINGULAIR) 10 MG tablet Take 10 mg by mouth at bedtime.    . nitroGLYCERIN (NITROSTAT) 0.4 MG SL tablet Place 1 tablet (0.4 mg total) under the tongue every 5 (five) minutes as needed for chest pain. 25 tablet 5  . pantoprazole (PROTONIX) 20 MG tablet Take 20 mg by mouth daily.    Vladimir Faster Glycol-Propyl Glycol (SYSTANE OP) Place 1 drop into both eyes daily as needed (for dry eyes).     . psyllium (METAMUCIL) 58.6 % packet Take 1 packet by mouth daily.    . rivaroxaban (XARELTO) 20 MG TABS tablet Take 1 tablet (20 mg total) by mouth daily with supper. 30 tablet   . sildenafil (VIAGRA) 100 MG tablet Take 100 mg by mouth as needed for erectile dysfunction.     . Skin Protectants, Misc. (EUCERIN) cream Apply 1 application topically as needed for dry skin.    Marland Kitchen  spironolactone (ALDACTONE) 25 MG tablet Take 25 mg by mouth daily.    . tamsulosin (FLOMAX) 0.4 MG CAPS Take 0.4 mg by mouth every evening.     . topiramate (TOPAMAX) 25 MG capsule Take 50 mg by mouth 2 (two) times daily.     . traMADol (ULTRAM) 50 MG tablet Take by mouth.     No facility-administered medications prior to visit.     Allergies:   Ace inhibitors, Amoxicillin-pot clavulanate, Quinolones, Adhesive [tape], Latex, Metformin, and Morphine   Social History   Socioeconomic History  . Marital status: Married    Spouse name: Not on file  . Number of children: 3  . Years of education: Not on file  . Highest education level: Not on file  Occupational History  . Occupation: Retired from Scientist, clinical (histocompatibility and immunogenetics): RETIRED  Tobacco Use  . Smoking status: Former Smoker    Packs/day: 1.50    Years: 30.00    Pack years: 45.00    Quit date: 01/24/1992    Years since quitting: 27.8  . Smokeless tobacco: Never Used  Vaping Use  . Vaping Use: Never used  Substance and Sexual Activity  . Alcohol use: No  . Drug use: No  . Sexual activity: Not Currently  Other Topics Concern  . Not on file  Social History Narrative  . Not on file   Social Determinants of Health  Financial Resource Strain:   . Difficulty of Paying Living Expenses: Not on file  Food Insecurity:   . Worried About Charity fundraiser in the Last Year: Not on file  . Ran Out of Food in the Last Year: Not on file  Transportation Needs:   . Lack of Transportation (Medical): Not on file  . Lack of Transportation (Non-Medical): Not on file  Physical Activity:   . Days of Exercise per Week: Not on file  . Minutes of Exercise per Session: Not on file  Stress:   . Feeling of Stress : Not on file  Social Connections:   . Frequency of Communication with Friends and Family: Not on file  . Frequency of Social Gatherings with Friends and Family: Not on file  . Attends Religious Services: Not on file  . Active Member of Clubs  or Organizations: Not on file  . Attends Archivist Meetings: Not on file  . Marital Status: Not on file     Family History:  The patient's family history includes Diabetes in his mother; Emphysema (age of onset: 47) in his father; Heart attack in his sister; Heart disease in his mother; Other in his mother.   Review of Systems:   Please see the history of present illness.    All other systems reviewed and are otherwise negative except as noted above.   Physical Exam:    VS:  BP (!) 96/54 (BP Location: Left Arm, Patient Position: Sitting, Cuff Size: Large)   Pulse 63   Ht 5\' 5"  (1.651 m)   Wt 236 lb (107 kg)   BMI 39.27 kg/m    GENERAL:  Well appearing,  obese WM in NAD HEENT:  PERRL, EOMI, sclera are clear. Oropharynx is clear. NECK:  No jugular venous distention, carotid upstroke brisk and symmetric, no bruits, no thyromegaly or adenopathy LUNGS:  Clear to auscultation bilaterally CHEST:  Unremarkable HEART:  RRR,  PMI not displaced or sustained,S1 and S2 within normal limits, no S3, no S4: no clicks, no rubs, no murmurs ABD:  Soft, nontender. BS +, no masses or bruits. No hepatomegaly, no splenomegaly EXT:  2 + pulses throughout, 1+ edema, no cyanosis no clubbing SKIN:  Warm and dry.  No rashes NEURO:  Alert and oriented x 3. Cranial nerves II through XII intact. PSYCH:  Cognitively intact        Wt Readings from Last 3 Encounters:  12/04/19 236 lb (107 kg)  11/24/19 243 lb 12.8 oz (110.6 kg)  05/23/19 238 lb (108 kg)      Studies/Labs Reviewed:   EKG:  EKG is not ordered today.   Recent Labs: 11/24/2019: BUN 25; Creatinine, Ser 1.11; Potassium 3.7; Sodium 138   Lipid Panel    Component Value Date/Time   CHOL 118 01/07/2013 0410   TRIG 133 01/07/2013 0410   HDL 40 01/07/2013 0410   CHOLHDL 3.0 01/07/2013 0410   VLDL 27 01/07/2013 0410   LDLCALC 51 01/07/2013 0410   Labs dated 07/11/16: cholesterol 114, triglycerides 80, HDL 46, LDL 52. A1c  7.1%.  Dated 01/24/17: cholesterol 110, triglycerides 50, HDL 52, LDL 48. A1c 7.8%.  Dated 07/16/17: cholesterol 106, triglycerides 72, HDL 50, LDL 41. Creatinine 1.36. Hgb 12.8. Other chemistries and TSH normal.  Dated 10/10/18: cholesterol 100, triglycerides 54, HDL 51, LDL 38.  Dated 10/28/19: cholesterol 97, triglycerides 35, HDL 53, LDL 38. Iron level 125. A1c 6.7 %. TSH normal.  Dated 11/26/19: creatinine 1.19. glucose 122.  Hgb 12.0. plts 125K. Other chemistries normal.   Additional studies/ records that were reviewed today include:   Myoview 03/06/17: Study Highlights    The left ventricular ejection fraction is normal (55-65%).  Nuclear stress EF: 61%.  There is a small defect of mild severity present in the basal inferior, mid inferior and apex location. The defect is partially reversible. Overall image quality is poor due to soft tissue attenuation. Cannot rule out a small area of ischemia.  There is evidence of transient ischemic dilatation with a TID of 1.27.  This is an intermediate risk study.  There was no ST segment deviation noted during stress    Echo 03/07/17: Study Conclusions  - Left ventricle: The cavity size was normal. Wall thickness was   increased in a pattern of mild LVH. Systolic function was normal.   The estimated ejection fraction was in the range of 55% to 60%.   Wall motion was normal; there were no regional wall motion   abnormalities. Left ventricular diastolic function parameters   were normal. - Aortic valve: There was very mild stenosis. Valve area (VTI):   1.81 cm^2. Valve area (Vmax): 1.94 cm^2. Valve area (Vmean): 1.85   cm^2. - Right atrium: The atrium was mildly dilated. - Atrial septum: No defect or patent foramen ovale was identified. - Pulmonary arteries: PA peak pressure: 54 mm Hg (S).  Cardiac cath 03/15/17: Conclusion     Previously placed Ost 2nd Mrg to 2nd Mrg stent (unknown type) is widely patent.  There is no aortic valve  regurgitation.   1. No significant obstructive CAD 2. Aneurysmal thoracic aorta.   Plan: continue medical management . Consider pulmonary evaluation for symptoms of dyspnea.      Referred for: Dyspnea on exertion   Procedure: This patient underwent staged symptom-limited exercise testing using an individualized bike protocol with expired gas analysis metabolic evaluation during exercise.  Demographics  Age: 60 Ht. (in.) 67.0 Wt. (lb) 272.4 BMI: 42.7   Predicted Peak VO2: 16.0  Gender: Male Ht (cm) 170.2 Wt. (kg) 123.6    Results  Pre-Exercise PFTs   FVC 2.01 (54%)     FEV1 1.21 (43%)      FEV1/FVC 60      MVV 68 (60%)      Exercise Time:  7:00   Watts: 60  RPE: 15  Reason stopped: patient ended test due to dyspnea (9/10)  Additional symptoms: chest tightness (6/10) and lightheaded (5/10)  Resting HR: 55 Peak HR: 107  (49% age predicted max HR)  BP rest: 122/70 BP peak: 170/82  Peak VO2: 11.4 (71% predicted peak VO2)  VE/VCO2 slope: 40  OUES: 1.82  Peak RER: 0.96  Ventilatory Threshold: 8.7 (54% predicted and 76% measured peak VO2)  Peak RR 36  Peak Ventilation: 52.2  VE/MVV: 77%  PETCO2 at peak: 30  O2pulse: 13  (93% predicted O2pulse)   Interpretation  Notes: Patient gave a good effort although he was dyspneic. Pulse-oximetry at rest 97% with a low of 93% at peak exercise. Exercise was performed on a cycle ergometer starting at [facility] and increasing by 10W/min.   ECG:  Resting ECG in sinus bradycardia with 1st degree AV block and incomplete right bundle branch block. HR response blunted. There were frequent PVCs at rest with suppression during exercise with no sustained arrhythmias and no ST-T changes. BP response appropriate.   PFT: Pre-exercise spirometry demonstrates severe obstruction. MVV below normal range.   CPX: Exercise Capacity- Exercise testing with  gas exchange demonstrates a mildly reduced peak VO2 of  11.4 ml/kg/min (71% of the age/gender/weight matched sedentary norms). The RER of 0.96 indicates a submaximal effort. When adjusted to the patient's ideal body weight of 162.6 lb (73.7 kg) the peak VO2 is 16.0 ml/kg (ibw)/min (70% of the ibw-adjusted predicted).   Cardiovascular response- The O2pulse (a surrogate for strokevolume) increased with incremental exercise reaching peak of 13 ml/beat (93% predicted). DeltaVO2/Delta WR is 8.4 Indicating no clear evidence of cardiovascular impairment.  Ventilatory response- The VE/VCO2 slope is elevated and indicates Increased dead space ventilation. The oxygen uptake efficiency slope (OUES) is 1.82. The VO2 at the ventilatory threshold was normal at 54% of the predicted peak VO2 and 76% measured PVO2. At peak exercise, the ventilation reached 54% of the measured MVV and breathing reserve was 16 indicating ventilatory reserve was nearly depleted. PETCO2 was normal at 74mmHg during exercise.   Conclusion: The interpretation of this test is limited due to submaximal effort during the exercise. Based on available data, exercise testing with gas exchange demonstrates mild functional impairment when compared to matched sedentary norms. Suspect dyspnea is multifactorial, there is evidence of effect of deconditioning and primary limitation due to body habitus.   In addition he had inadequate heart rate response to exercise and a state of hyperventilation at peak exercise, which induced an elevated VE/VCO2 slope. He approached depletion of ventilatory reserve which may be due to his obstructive lung disease.  Test, report and preliminary impression by: Landis Martins, MS, ACSM-RCEP 09/26/2017 3:03 PM  FINALIZED Marshell Garfinkel MD  Pulmonary and Critical Care 10/08/2017, 10:04 AM  Assessment:    1. Paroxysmal atrial fibrillation (HCC)   2. Chronic diastolic heart failure ([place])   3. Coronary artery disease involving native coronary artery of native heart  without angina pectoris   4. Essential hypertension   5. Morbid obesity ([place])      Plan:   In order of problems listed above:  1. Paroxysmal Atrial Fibrillation/ Long-term Anticoagulation - s/p DCCV in August 2018.  - s/p Afib and Aflutter ablation in April 2019. Now off antiarrhythmic therapy. - some breakthrough after prior surgery a year ago but doing well now.  - This patients CHA2DS2-VASc Score and unadjusted Ischemic Stroke Rate (% per year) is equal to 9.7 % stroke rate/year from a score of 6 (HTN, DM, Vascular, Age, TE (2)). He denies any evidence of active bleeding. Continue Xarelto for anticoagulation. He will let us know if he plans to have nerve stimulator placed so we can inform when he should hold Xarelto.   2. CAD - s/p BMS to 2nd OM in 2012, cardiac cath Feb 2019 showed continued patency with nonobstructive disease. - asymptomatic.  - Continue  statin therapy. No ASA secondary to need for Xarelto.  3. Chronic diastolic CHF - echo in 1191 showed a preserved EF of 55-60%. He has no significant increase in  edema on examination and weight is down.  -. Note right heart findings in 2016.  - Now on Jardiance  - edema stable since lasix reduced to once a day.  4. HTN - BP is well-controlled  - continue  Current therapy  5. HLD -  LDL 38.  - continue Atorvastatin 10mg  daily.   6. Thoracic aortic aneurysm  - stable at 5.0 cm by last CT Scan. - followed by Dr. Servando Snare with CT Surgery.   7. Chronic respiratory failure due to morbid obesity and OSA. Followed by pulmonary. - recent CPX suggests  major limitation is deconditioning and body habitus. Improved with weight loss.   8. Pulmonary HTN secondary to #3 and #7.  Cardiac cath in 2016 showed mild pulmonary venous HTN. Last Echo in Sept 2019 showed normal pulmonary pressure.       Signed, Christelle Igoe [place], MD,FACC 12/04/2019 9:29 AM    [facility] [address] [place],  [place] [address] Phone: [phone]

## 2019-12-03 DIAGNOSIS — I889 Nonspecific lymphadenitis, unspecified: Secondary | ICD-10-CM | POA: Diagnosis not present

## 2019-12-04 ENCOUNTER — Encounter: Payer: Self-pay | Admitting: Cardiology

## 2019-12-04 ENCOUNTER — Ambulatory Visit (INDEPENDENT_AMBULATORY_CARE_PROVIDER_SITE_OTHER): Payer: Medicare Other | Admitting: Cardiology

## 2019-12-04 ENCOUNTER — Other Ambulatory Visit: Payer: Self-pay

## 2019-12-04 VITALS — BP 96/54 | HR 63 | Ht 65.0 in | Wt 236.0 lb

## 2019-12-04 DIAGNOSIS — I480 Paroxysmal atrial fibrillation: Secondary | ICD-10-CM | POA: Diagnosis not present

## 2019-12-04 DIAGNOSIS — I10 Essential (primary) hypertension: Secondary | ICD-10-CM | POA: Diagnosis not present

## 2019-12-04 DIAGNOSIS — I5032 Chronic diastolic (congestive) heart failure: Secondary | ICD-10-CM | POA: Diagnosis not present

## 2019-12-04 DIAGNOSIS — I2510 Atherosclerotic heart disease of native coronary artery without angina pectoris: Secondary | ICD-10-CM

## 2019-12-11 DIAGNOSIS — Z23 Encounter for immunization: Secondary | ICD-10-CM | POA: Diagnosis not present

## 2019-12-22 ENCOUNTER — Other Ambulatory Visit: Payer: Self-pay

## 2019-12-22 ENCOUNTER — Encounter: Payer: Self-pay | Admitting: Pulmonary Disease

## 2019-12-22 ENCOUNTER — Ambulatory Visit (INDEPENDENT_AMBULATORY_CARE_PROVIDER_SITE_OTHER): Payer: Medicare Other | Admitting: Pulmonary Disease

## 2019-12-22 VITALS — BP 108/60 | HR 56 | Temp 97.2°F | Ht 66.5 in | Wt 241.2 lb

## 2019-12-22 DIAGNOSIS — Z9989 Dependence on other enabling machines and devices: Secondary | ICD-10-CM | POA: Diagnosis not present

## 2019-12-22 DIAGNOSIS — G4733 Obstructive sleep apnea (adult) (pediatric): Secondary | ICD-10-CM

## 2019-12-22 DIAGNOSIS — I2510 Atherosclerotic heart disease of native coronary artery without angina pectoris: Secondary | ICD-10-CM | POA: Diagnosis not present

## 2019-12-22 NOTE — Patient Instructions (Signed)
Will arrange for new CPAP machine and CPAP mask refitting  Follow up in 3 months

## 2019-12-22 NOTE — Progress Notes (Signed)
[facility]  Chief Complaint  Patient presents with   Follow-up    Would like new cpap machine    Constitutional:  BP 108/60 (BP Location: Left Arm, Cuff Size: Normal)    Pulse (!) 56    Temp (!) 97.2 F (36.2 C) (Other (Comment)) Comment (Src): wrist   Ht 5' 6.5" (1.689 m)    Wt 241 lb 3.2 oz (109.4 kg)    SpO2 98% Comment: room air   BMI 38.35 kg/m   Past Medical History:  Ascending aorta dilation, CAD s/p stent, Diastolic CHF, HTN,  A fib, Colon polyps, DVT, PE, Headaches, GERD, PUD, IBS, OA, Pneumonia, Prostate cancer, DM type 2  Past Surgical History:  His  has a past surgical history that includes Hemorroidectomy; Vasectomy; Cervical spine surgery (06/02/2010); Achilles tendon repair (Bilateral); Coronary stent placement (Sept 2012); Appendectomy; Laminectomy (05/30/2012); left and right heart catheterization with coronary angiogram (N/A, 05/07/2014); Laparoscopic gastric banding; Uvulopalatopharyngoplasty; Colonoscopy with propofol (N/A, 12/29/2014); Carpal tunnel release (Bilateral); Trigger finger release; Lumbar laminectomy/decompression microdiscectomy (N/A, 05/04/2016); Posterior lumbar fusion (10/18/2016); Laminectomy with posterior lateral arthrodesis level 3 (N/A, 10/18/2016); LEFT HEART CATH AND CORONARY ANGIOGRAPHY (N/A, 03/13/2017); AORTIC ARCH ANGIOGRAPHY (N/A, 03/13/2017); ATRIAL FIBRILLATION ABLATION (05/11/2017); Laparoscopic cholecystectomy; Shoulder open rotator cuff repair (Bilateral); Back surgery; Cataract extraction w/ intraocular lens  implant, bilateral (Bilateral); Tonsillectomy and adenoidectomy; Cardiac catheterization (2006); Coronary angioplasty with stent (October 2012); ATRIAL FIBRILLATION ABLATION (N/A, 05/11/2017); Esophagogastroduodenoscopy (N/A, 03/14/2018); Colonoscopy (N/A, 03/14/2018); biopsy (03/14/2018); polypectomy (03/14/2018); and Total hip arthroplasty (Right, 10/18/2018).  Brief Summary:  Carlos Dawson is a 79  y.o. male former smoker with dyspnea, OSA, and asthma.      Subjective:   I last saw him in 2019.  He has lost about 30 lbs since then.  Breathing better.  Feels CPAP mask is too big now.  Not having sinus congestion, sore throat, dry mouth, or aerophagia.  Symbicort helps.  His asthma is usually worse in the Winter months.  He uses SoClean device.  His machine is more than 78 yrs old and making noises.  Physical Exam:   Appearance - well kempt   ENMT - no sinus tenderness, no oral exudate, no LAN, Mallampati 3 airway, no stridor  Respiratory - equal breath sounds bilaterally, no wheezing or rales  CV - s1s2 regular rate and rhythm, no murmurs  Ext - no clubbing, no edema  Skin - no rashes  Psych - normal mood and affect   Pulmonary testing:   PFT 12/20/10 >> FEV1 2.13(79%), FEV1% 71, TLC 5.80(100%), DLCO 57%, no BD  Spirometry 05/11/15 >> FEV1 1.68 (55%), FEV1% 69  FeNO 08/13/15 >> 13  Room air SpO2 with exertion 08/13/15 >> 87% - improved with 2 liters oxygen  Chest Imaging:   CT chest 09/14/11 >> Stable dilatation of ascending thoracic aorta 4.7 cm, PA 4.1 cm  CT angio chest 10/01/14 >> aortic dilation  CT angio chest 09/15/16 >> 5 cm ascending TAA  CT angio chest 04/24/19 >> ascending aorta 4.8 x 4.8 cm  Sleep Tests:   PSG 03/30/97 >> AHI 21  PSG 10/21/13 >> AHI 40.1, SaO2 80%, PLMI 75.6  CPAP 12/28/13 >>CPAP 10 cm H2O >>AHI 1.5, +R   ONO with CPAP 08/19/15 >>test time 8 hrs 40 min. Basal SpO2 92.3%, low SpO2 86%. Spent 1.3 min with SpO2 <88%.  CPAP 11/21/19 to 12/20/19 >> used on 30 of 30 nights with average 8 hrs 4 min.  Average AHI 1.5 with CPAP 10 cm H2O  Cardiac Tests:   [facility] 03/13/17 >> patent Ost 2nd Mrg stent  Cardiopulmonary exercise test 09/26/17 >> deconditioning  Echo 10/17/17 >> EF 60 to 65%, grade 2 DD  Social History:  He  reports that he quit smoking about 27 years ago. He has a 45.00 pack-year smoking history. He has never used smokeless  tobacco. He reports that he does not drink alcohol and does not use drugs.  Family History:  His family history includes Diabetes in his mother; Emphysema (age of onset: 76) in his father; Heart attack in his sister; Heart disease in his mother; Other in his mother.     Assessment/Plan:   Mild, persistent asthma. - continue symbicort and singulair - prn albuterol  Obstructive sleep apnea. - he is compliant with CPAP and reports benefit from therapy - uses [employer] for his DME - continue CPAP 10 cm H2O - his machine is more than 78 yrs old, not functioning properly and not amenable to repair - will arrange for new CPAP machine and CPAP mask refitting since he has lost weight - discussed concerns regarding ozone exposure from [facility] device  Allergic rhinitis with upper airway cough syndrome. - continue singulair  A fib s/p ablation, chronic diastolic CHF. - followed by Dr. Peter Martinique with [facility]  Ascending thoracic aortic aneurysm. - f/u with Dr. Servando Snare  Time Spent Involved in Patient Care on Day of Examination:  32 minutes  Follow up:  Patient Instructions  Will arrange for new CPAP machine and CPAP mask refitting  Follow up in 3 months   Medication List:   Allergies as of 12/22/2019      Reactions   Ace Inhibitors Cough   Amoxicillin-pot Clavulanate    Other reaction(s): GI Upset (intolerance)   Quinolones    Patient was warned about not using Cipro and similar antibiotics. Recent studies have raised concern that fluoroquinolone antibiotics could be associated with an increased risk of aortic aneurysm Fluoroquinolones have non-antimicrobial properties that might jeopardise the integrity of the extracellular matrix of the vascular wall In a  propensity score matched cohort study in [place], there was a 66% increased rate of aortic aneurysm or dissection associated with oral fluoroquinolone use, compared wit   Adhesive [tape] Itching, Rash   Latex  Itching, Rash, Other (See Comments)   Bandaids   Metformin Diarrhea   Morphine Itching      Medication List       Accurate as of December 22, 2019 10:16 AM. If you have any questions, ask your nurse or doctor.        acetaminophen 500 MG tablet Commonly known as: TYLENOL Take 1,000 mg by mouth every 8 (eight) hours as needed for mild pain or headache.   albuterol 108 (90 Base) MCG/ACT inhaler Commonly known as: VENTOLIN HFA Inhale 2 puffs into the lungs every 4 (four) hours as needed for wheezing or shortness of breath.   atorvastatin 10 MG tablet Commonly known as: LIPITOR Take 5 mg by mouth daily at 6 PM.   augmented betamethasone dipropionate 0.05 % cream Commonly known as: DIPROLENE-AF Apply 1 application topically 2 (two) times daily as needed for itching.   budesonide-formoterol 80-4.5 MCG/ACT inhaler Commonly known as: SYMBICORT Inhale 2 puffs into the lungs 2 (two) times daily as needed (for shortness of breath).   celecoxib 200 MG capsule Commonly known as: CELEBREX Take 200 mg by mouth 2 (two) times daily as needed for  moderate pain.   Coenzyme Q10 100 MG capsule Take 100 mg by mouth daily.   eucerin cream Apply 1 application topically as needed for dry skin.   furosemide 80 MG tablet Commonly known as: LASIX Take 80 mg by mouth daily. Take 80 mg in the morning   Jardiance 25 MG Tabs tablet Generic drug: empagliflozin Take 1 tablet by mouth daily.   Klor-Con M20 20 MEQ tablet Generic drug: potassium chloride SA TAKE 2 TABLETS BY MOUTH TWICE A DAY What changed:   how much to take  when to take this   losartan 25 MG tablet Commonly known as: COZAAR TAKE 1 TABLET BY MOUTH EVERY DAY   methocarbamol 500 MG tablet Commonly known as: ROBAXIN Take 1 tablet (500 mg total) by mouth every 8 (eight) hours as needed for muscle spasms.   metoprolol tartrate 25 MG tablet Commonly known as: LOPRESSOR Take 12.5 mg by mouth 2 (two) times daily.    montelukast 10 MG tablet Commonly known as: SINGULAIR Take 10 mg by mouth at bedtime.   nitroGLYCERIN 0.4 MG SL tablet Commonly known as: Nitrostat Place 1 tablet (0.4 mg total) under the tongue every 5 (five) minutes as needed for chest pain.   pantoprazole 20 MG tablet Commonly known as: PROTONIX Take 20 mg by mouth daily.   psyllium 58.6 % packet Commonly known as: METAMUCIL Take 1 packet by mouth daily.   rivaroxaban 20 MG Tabs tablet Commonly known as: XARELTO Take 1 tablet (20 mg total) by mouth daily with supper.   sildenafil 100 MG tablet Commonly known as: VIAGRA Take 100 mg by mouth as needed for erectile dysfunction.   spironolactone 25 MG tablet Commonly known as: ALDACTONE Take 25 mg by mouth daily.   SYSTANE OP Place 1 drop into both eyes daily as needed (for dry eyes).   tamsulosin 0.4 MG Caps capsule Commonly known as: FLOMAX Take 0.4 mg by mouth every evening.   topiramate 25 MG capsule Commonly known as: TOPAMAX Take 50 mg by mouth 2 (two) times daily.   Vitamin D-3 125 MCG (5000 UT) Tabs Take 5,000 Units by mouth daily.       Signature:  Carlos Mires, MD [place] Pager - [phone] 12/22/2019, 10:16 AM

## 2020-01-01 DIAGNOSIS — I10 Essential (primary) hypertension: Secondary | ICD-10-CM | POA: Diagnosis not present

## 2020-01-01 DIAGNOSIS — Z6838 Body mass index (BMI) 38.0-38.9, adult: Secondary | ICD-10-CM | POA: Diagnosis not present

## 2020-01-01 DIAGNOSIS — M545 Low back pain, unspecified: Secondary | ICD-10-CM | POA: Diagnosis not present

## 2020-01-05 ENCOUNTER — Other Ambulatory Visit: Payer: Self-pay | Admitting: Cardiothoracic Surgery

## 2020-01-05 DIAGNOSIS — I712 Thoracic aortic aneurysm, without rupture, unspecified: Secondary | ICD-10-CM

## 2020-01-06 DIAGNOSIS — C61 Malignant neoplasm of prostate: Secondary | ICD-10-CM | POA: Diagnosis not present

## 2020-01-06 DIAGNOSIS — I10 Essential (primary) hypertension: Secondary | ICD-10-CM | POA: Diagnosis not present

## 2020-01-06 DIAGNOSIS — N183 Chronic kidney disease, stage 3 unspecified: Secondary | ICD-10-CM | POA: Diagnosis not present

## 2020-01-06 DIAGNOSIS — I4891 Unspecified atrial fibrillation: Secondary | ICD-10-CM | POA: Diagnosis not present

## 2020-01-06 DIAGNOSIS — I5030 Unspecified diastolic (congestive) heart failure: Secondary | ICD-10-CM | POA: Diagnosis not present

## 2020-01-06 DIAGNOSIS — E119 Type 2 diabetes mellitus without complications: Secondary | ICD-10-CM | POA: Diagnosis not present

## 2020-01-06 DIAGNOSIS — N400 Enlarged prostate without lower urinary tract symptoms: Secondary | ICD-10-CM | POA: Diagnosis not present

## 2020-01-06 DIAGNOSIS — I509 Heart failure, unspecified: Secondary | ICD-10-CM | POA: Diagnosis not present

## 2020-01-06 DIAGNOSIS — K219 Gastro-esophageal reflux disease without esophagitis: Secondary | ICD-10-CM | POA: Diagnosis not present

## 2020-01-06 DIAGNOSIS — D509 Iron deficiency anemia, unspecified: Secondary | ICD-10-CM | POA: Diagnosis not present

## 2020-01-06 DIAGNOSIS — E785 Hyperlipidemia, unspecified: Secondary | ICD-10-CM | POA: Diagnosis not present

## 2020-01-08 DIAGNOSIS — M25512 Pain in left shoulder: Secondary | ICD-10-CM | POA: Diagnosis not present

## 2020-01-08 DIAGNOSIS — M25552 Pain in left hip: Secondary | ICD-10-CM | POA: Diagnosis not present

## 2020-01-13 DIAGNOSIS — M48062 Spinal stenosis, lumbar region with neurogenic claudication: Secondary | ICD-10-CM | POA: Diagnosis not present

## 2020-01-26 DIAGNOSIS — M4810 Ankylosing hyperostosis [Forestier], site unspecified: Secondary | ICD-10-CM | POA: Diagnosis not present

## 2020-02-16 DIAGNOSIS — E785 Hyperlipidemia, unspecified: Secondary | ICD-10-CM | POA: Diagnosis not present

## 2020-02-16 DIAGNOSIS — I10 Essential (primary) hypertension: Secondary | ICD-10-CM | POA: Diagnosis not present

## 2020-02-16 DIAGNOSIS — I4891 Unspecified atrial fibrillation: Secondary | ICD-10-CM | POA: Diagnosis not present

## 2020-02-16 DIAGNOSIS — I2510 Atherosclerotic heart disease of native coronary artery without angina pectoris: Secondary | ICD-10-CM | POA: Diagnosis not present

## 2020-02-16 DIAGNOSIS — I480 Paroxysmal atrial fibrillation: Secondary | ICD-10-CM | POA: Diagnosis not present

## 2020-02-16 DIAGNOSIS — E119 Type 2 diabetes mellitus without complications: Secondary | ICD-10-CM | POA: Diagnosis not present

## 2020-02-16 DIAGNOSIS — K219 Gastro-esophageal reflux disease without esophagitis: Secondary | ICD-10-CM | POA: Diagnosis not present

## 2020-02-16 DIAGNOSIS — N183 Chronic kidney disease, stage 3 unspecified: Secondary | ICD-10-CM | POA: Diagnosis not present

## 2020-02-16 DIAGNOSIS — I509 Heart failure, unspecified: Secondary | ICD-10-CM | POA: Diagnosis not present

## 2020-02-16 DIAGNOSIS — J4530 Mild persistent asthma, uncomplicated: Secondary | ICD-10-CM | POA: Diagnosis not present

## 2020-02-16 DIAGNOSIS — N400 Enlarged prostate without lower urinary tract symptoms: Secondary | ICD-10-CM | POA: Diagnosis not present

## 2020-02-16 NOTE — Progress Notes (Signed)
[address]       [place] [address]             [phone]                   Massimiliano C Baril [facility] Medical Record [id number] Date of Birth: 1941-01-16  Referring: Martinique, [name], MD Primary Care: Josetta Huddle, MD  Chief Complaint:    Chief Complaint  Patient presents with  . Thoracic Aortic Aneurysm     f/u with CTA Chest    History of Present Illness:    Patient comes to the office today for follow-up CT of the chest-for known dilated ascending aorta.  CT scans back to 2000 note a dilatation of the ascending aorta 5 cm.  I have been following the patient with CT scans since 2013.  Patient has known coronary artery disease with a stent placed in his circumflex coronary artery in the past, diagnosis of prostate cancer with treatments at [facility].  He has known severe underlying pulmonary disease.  His mobility is limited due to chronic back pain.  He is anticoagulated for atrial fibrillation.  His last cardiac catheterization was in February 2019-at that time he had no significant coronary artery disease or aortic valve insufficiency.  Current Activity/ Functional Status: Patient is independent with mobility/ambulation, transfers, ADL's, IADL's.   Past Medical History  Diagnosis Date  . Hypertension   . Obesities, morbid   . Pulmonary embolism     2008  . Anticoagulant long-term use   . CAD (coronary artery disease)     a. s/p BMS to 2nd OM in Sept 2012; b. LexiScan Myoview (12/2012):  Inf infarct; bowel and motion artifact make study difficult to interpret; no ischemia; not gated; Low Risk  . OA (osteoarthritis)   . LVH (left ventricular hypertrophy)   . Colonic polyp   . Mild intermittent asthma   . Hemorrhoids   . Generalized headaches   . SOB (shortness of breath)     using oxygen with exercise  . Diastolic dysfunction   . ASCVD (arteriosclerotic cardiovascular disease)     Prior BMS to the 2nd OM in September 2012; with  repeat cath in October showing patency  . GERD (gastroesophageal reflux disease)   . IBS (irritable bowel syndrome)   . Aortic root enlargement   .        Marland Kitchen PAF (paroxysmal atrial fibrillation)     treated with Coumadin  . Hearing loss   . Contact lens/glasses fitting   . OSA (obstructive sleep apnea)     PSG 03/30/97 AHI 21, BPAP 13/9  . Sleep apnea   . Prostate ca   . Thoracic aortic aneurysm     Aortic Size Index=     5.0    /Body surface area is 2.43 meters squared. = 2.05  < 2.75 cm/m2      4% risk per year 2.75 to 4.25          8% risk per year > 4.25 cm/m2    20% risk per year   Stable aneurysmal dilation of the ascending aorta with maximum AP diameter of 4.8 cm. Stable area of narrowing of the proximal most portion of the descending aorta measuring 2 cm., previously identified as an area of coarctation. No evidence of aortic dissection.  Coronary artery disease.  Normal appearance of the lungs.   Electronically Signed   By: Thomas Hoff  Dimitrova M.D.   On: 10/01/2014 08:50      Past Surgical History:  Procedure Laterality Date  . ACHILLES TENDON REPAIR Bilateral   . AORTIC ARCH ANGIOGRAPHY N/A 03/13/2017   Procedure: AORTIC ARCH ANGIOGRAPHY;  Surgeon: Martinique, [name], MD;  Location: [facility];  Service: Cardiovascular;  Laterality: N/A;  . APPENDECTOMY    . ATRIAL FIBRILLATION ABLATION  05/11/2017  . ATRIAL FIBRILLATION ABLATION N/A 05/11/2017   Procedure: ATRIAL FIBRILLATION ABLATION;  Surgeon: [name], MD;  Location: [place] [facility];  Service: Cardiovascular;  Laterality: N/A;  . BACK SURGERY     "I've had 7 back and 1 neck ORs" (05/11/2017)  . BIOPSY  03/14/2018   Procedure: BIOPSY;  Surgeon: Ronnette Juniper, MD;  Location: [facility];  Service: Gastroenterology;;  EGD and Colon  . CARDIAC CATHETERIZATION  2006  . CARPAL TUNNEL RELEASE Bilateral    LEFT  . CATARACT EXTRACTION W/ INTRAOCULAR LENS  IMPLANT, BILATERAL Bilateral   . CERVICAL SPINE SURGERY   06/02/2010   lower back and neck  . COLONOSCOPY N/A 03/14/2018   Procedure: COLONOSCOPY;  Surgeon: Ronnette Juniper, MD;  Location: [facility];  Service: Gastroenterology;  Laterality: N/A;  . COLONOSCOPY WITH PROPOFOL N/A 12/29/2014   Procedure: COLONOSCOPY WITH PROPOFOL;  Surgeon: Garlan Fair, MD;  Location: [facility];  Service: Endoscopy;  Laterality: N/A;  . CORONARY ANGIOPLASTY WITH STENT PLACEMENT  October 2012  . CORONARY STENT PLACEMENT  Sept 2012   2nd OM with BMS  . ESOPHAGOGASTRODUODENOSCOPY N/A 03/14/2018   Procedure: ESOPHAGOGASTRODUODENOSCOPY (EGD);  Surgeon: Ronnette Juniper, MD;  Location: [facility];  Service: Gastroenterology;  Laterality: N/A;  . HEMORROIDECTOMY    . LAMINECTOMY  05/30/2012   L 4 L5  . LAMINECTOMY WITH POSTERIOR LATERAL ARTHRODESIS LEVEL 3 N/A 10/18/2016   Procedure: Posterior Lateral Fusion - Lumbar One-Four, segmental instrumentation Lumbar One-Five,  decompression,;  Surgeon: Eustace Moore, MD;  Location: [facility] OR;  Service: Neurosurgery;  Laterality: N/A;  . LAPAROSCOPIC CHOLECYSTECTOMY    . LAPAROSCOPIC GASTRIC BANDING    . LEFT AND RIGHT HEART CATHETERIZATION WITH CORONARY ANGIOGRAM N/A 05/07/2014   Procedure: LEFT AND RIGHT HEART CATHETERIZATION WITH CORONARY ANGIOGRAM;  Surgeon: [name] Martinique, MD;  Location: [facility];  Service: Cardiovascular;  Laterality: N/A;  . LEFT HEART CATH AND CORONARY ANGIOGRAPHY N/A 03/13/2017   Procedure: LEFT HEART CATH AND CORONARY ANGIOGRAPHY;  Surgeon: Martinique, [name], MD;  Location: [place] [facility];  Service: Cardiovascular;  Laterality: N/A;  . LUMBAR LAMINECTOMY/DECOMPRESSION MICRODISCECTOMY N/A 05/04/2016   Procedure: Laminectomy and Foraminotomy - Thoracic twelve-Lumbar one -Posterior Fusion Lumbar one-two;  Surgeon: Eustace Moore, MD;  Location: [facility];  Service: Neurosurgery;  Laterality: N/A;  . POLYPECTOMY  03/14/2018   Procedure: POLYPECTOMY;  Surgeon: Ronnette Juniper, MD;  Location: [facility];  Service:  Gastroenterology;;  . POSTERIOR LUMBAR FUSION  10/18/2016  . SHOULDER OPEN ROTATOR CUFF REPAIR Bilateral   . TONSILLECTOMY AND ADENOIDECTOMY    . TOTAL HIP ARTHROPLASTY Right 10/18/2018   Procedure: RIGHT TOTAL HIP ARTHROPLASTY ANTERIOR APPROACH;  Surgeon: Mcarthur Rossetti, MD;  Location: [facility];  Service: Orthopedics;  Laterality: Right;  . TRIGGER FINGER RELEASE     LEFT  . UVULOPALATOPHARYNGOPLASTY    . VASECTOMY      Family History  Problem Relation Age of Onset  . Heart disease Mother   . Diabetes Mother   . Other Mother        stent  placement  . Emphysema Father 27  . Heart attack Sister     Social History   Socioeconomic History  . Marital status: Married    Spouse name: Not on file  . Number of children: 3  . Years of education: Not on file  . Highest education level: Not on file  Occupational History  . Occupation: Retired from Scientist, clinical (histocompatibility and immunogenetics): RETIRED  Tobacco Use  . Smoking status: Former Smoker    Packs/day: 1.50    Years: 30.00    Pack years: 45.00    Quit date: 01/24/1992    Years since quitting: 28.0  . Smokeless tobacco: Never Used  Vaping Use  . Vaping Use: Never used  Substance and Sexual Activity  . Alcohol use: No  . Drug use: No  . Sexual activity: Not Currently  Other Topics Concern  . Not on file  Social History Narrative  . Not on file   Social Determinants of Health   Financial Resource Strain: Not on file  Food Insecurity: Not on file  Transportation Needs: Not on file  Physical Activity: Not on file  Stress: Not on file  Social Connections: Not on file  Intimate Partner Violence: Not on file    Social History   Tobacco Use  Smoking Status Former Smoker  . Packs/day: 1.50  . Years: 30.00  . Pack years: 45.00  . Quit date: 01/24/1992  . Years since quitting: 28.0  Smokeless Tobacco Never Used    Social History   Substance and Sexual Activity  Alcohol Use No     Allergies  Allergen Reactions  . Ace  Inhibitors Cough  . Amoxicillin-Pot Clavulanate     Other reaction(s): GI Upset (intolerance)  . Quinolones     Patient was warned about not using Cipro and similar antibiotics. Recent studies have raised concern that fluoroquinolone antibiotics could be associated with an increased risk of aortic aneurysm Fluoroquinolones have non-antimicrobial properties that might jeopardise the integrity of the extracellular matrix of the vascular wall In a  propensity score matched cohort study in [place], there was a 66% increased rate of aortic aneurysm or dissection associated with oral fluoroquinolone use, compared wit  . Adhesive [Tape] Itching and Rash  . Latex Itching, Rash and Other (See Comments)    Bandaids  . Metformin Diarrhea  . Morphine Itching    Current Outpatient Medications  Medication Sig Dispense Refill  . acetaminophen (TYLENOL) 500 MG tablet Take 1,000 mg by mouth every 8 (eight) hours as needed for mild pain or headache.     . albuterol (PROVENTIL HFA;VENTOLIN HFA) 108 (90 Base) MCG/ACT inhaler Inhale 2 puffs into the lungs every 4 (four) hours as needed for wheezing or shortness of breath.    Marland Kitchen atorvastatin (LIPITOR) 10 MG tablet Take 5 mg by mouth daily at 6 PM.     . augmented betamethasone dipropionate (DIPROLENE-AF) 0.05 % cream Apply 1 application topically 2 (two) times daily as needed for itching.  3  . budesonide-formoterol (SYMBICORT) 80-4.5 MCG/ACT inhaler Inhale 2 puffs into the lungs 2 (two) times daily as needed (for shortness of breath).     . celecoxib (CELEBREX) 200 MG capsule Take 200 mg by mouth 2 (two) times daily as needed for moderate pain.  0  . Cholecalciferol (VITAMIN D-3) 5000 units TABS Take 5,000 Units by mouth daily.    . Coenzyme Q10 100 MG capsule Take 100 mg by mouth daily.     . furosemide (LASIX)  80 MG tablet Take 80 mg by mouth daily. Take 80 mg in the morning     . JARDIANCE 25 MG TABS tablet Take 1 tablet by mouth daily.    Marland Kitchen KLOR-CON M20 20  MEQ tablet TAKE 2 TABLETS BY MOUTH TWICE A DAY (Patient taking differently: Take 60 mEq by mouth daily.) 60 tablet 1  . losartan (COZAAR) 25 MG tablet TAKE 1 TABLET BY MOUTH EVERY DAY 90 tablet 3  . methocarbamol (ROBAXIN) 500 MG tablet Take 1 tablet (500 mg total) by mouth every 8 (eight) hours as needed for muscle spasms. 60 tablet 1  . metoprolol tartrate (LOPRESSOR) 25 MG tablet Take 12.5 mg by mouth 2 (two) times daily.    . montelukast (SINGULAIR) 10 MG tablet Take 10 mg by mouth at bedtime.    . nitroGLYCERIN (NITROSTAT) 0.4 MG SL tablet Place 1 tablet (0.4 mg total) under the tongue every 5 (five) minutes as needed for chest pain. 25 tablet 5  . pantoprazole (PROTONIX) 20 MG tablet Take 20 mg by mouth daily.    Vladimir Faster Glycol-Propyl Glycol (SYSTANE OP) Place 1 drop into both eyes daily as needed (for dry eyes).     . psyllium (METAMUCIL) 58.6 % packet Take 1 packet by mouth daily.    . rivaroxaban (XARELTO) 20 MG TABS tablet Take 1 tablet (20 mg total) by mouth daily with supper. 30 tablet   . sildenafil (VIAGRA) 100 MG tablet Take 100 mg by mouth as needed for erectile dysfunction.     . Skin Protectants, Misc. (EUCERIN) cream Apply 1 application topically as needed for dry skin.    Marland Kitchen spironolactone (ALDACTONE) 25 MG tablet Take 25 mg by mouth daily.    . tamsulosin (FLOMAX) 0.4 MG CAPS Take 0.4 mg by mouth every evening.     . topiramate (TOPAMAX) 25 MG capsule Take 50 mg by mouth 2 (two) times daily.      No current facility-administered medications for this visit.       Review of Systems:     Review of Systems:     Cardiac Review of Systems: Y or N  Chest Pain Blue.Reese ]  Resting SOB Blue.Reese  ] Exertional SOB  [y]  Orthopnea [y]   Pedal Edema [y  Palpitations [ y Syncope  [ n ]   Presyncope [n ]  General Review of Systems: [Y] = yes [  ]=no Constitional: recent weight change [  ]; anorexia [  ]; fatigue [  ]; nausea [  ]; night sweats [  ]; fever [  ]; or chills [  ];                                                                                                                                           Dental: poor dentition[  ];   Eye : blurred vision [  ];  diplopia [   ]; vision changes [  ];  Amaurosis fugax[  ]; Resp: cough [  ];  wheezing[  ];  hemoptysis[  ]; shortness of breath[y ]; paroxysmal nocturnal dyspnea[y]; dyspnea on exertion[y]; or orthopnea[  ];  GI:  gallstones[  ], vomiting[  ];  dysphagia[  ]; melena[  ];  hematochezia [  ]; heartburn[  ];   Hx of  Colonoscopy[  ]; GU: kidney stones [  ]; hematuria[  ];   dysuria [  ];  nocturia[  ];  history of     obstruction [  ];                 Skin: rash, swelling[  ];, hair loss[  ];  peripheral edema[  ];  or itching[  ]; Musculosketetal: myalgias[  ];  joint swelling[  ];  joint erythema[  ];  joint pain[  ];  back pain[  ];  Heme/Lymph: bruising[  ];  bleeding[  ];  anemia[  ];  Neuro: TIA[  ];  headaches[  ];  stroke[  ];  vertigo[  ];  seizures[  ];   paresthesias[  ];  difficulty walking[Y ];  Psych:depression[  ]; anxiety[  ];  Endocrine: diabetes[  ];  thyroid dysfunction[  ];  Immunizations: Flu [  y; Pneumococcal[y ]; covid yes   Other:    Physical Exam: BP 123/66 (BP Location: Right Arm, Patient Position: Sitting, Cuff Size: Large)   Pulse 60   Temp 98.1 F (36.7 C) (Skin)   Resp 20   Ht 5' 6.5" (1.689 m)   Wt 249 lb 12.8 oz (113.3 kg)   SpO2 92% Comment: RA  BMI 39.71 kg/m  General appearance: alert, cooperative, appears stated age and no distress Head: Normocephalic, without obvious abnormality, atraumatic Neck: no adenopathy, no carotid bruit, no JVD, supple, symmetrical, trachea midline and thyroid not enlarged, symmetric, no tenderness/mass/nodules Resp: clear to auscultation bilaterally Cardio: regular rate and rhythm, S1, S2 normal, no murmur, click, rub or gallop GI: soft, non-tender; bowel sounds normal; no masses,  no organomegaly Extremities: extremities normal, atraumatic, no  cyanosis or edema Neurologic: Grossly normalPatient does have difficulty walking but is able to get in and out of the office on his own  Diagnostic Studies & Laboratory data:     Recent Radiology Findings:  CT ANGIO CHEST AORTA W/CM & OR WO/CM  Result Date: 02/19/2020 CLINICAL DATA:  80 year old male with a history of thoracic aortic aneurysm EXAM: CT ANGIOGRAPHY CHEST WITH CONTRAST TECHNIQUE: Multidetector CT imaging of the chest was performed using the standard protocol during bolus administration of intravenous contrast. Multiplanar CT image reconstructions and MIPs were obtained to evaluate the vascular anatomy. CONTRAST:  15mL ISOVUE-370 IOPAMIDOL (ISOVUE-370) INJECTION 76% COMPARISON:  04/24/2019, 05/16/2018, most remote 12/23/2004 FINDINGS: Cardiovascular: Heart: Cardiomegaly again noted. No pericardial fluid/thickening. Calcifications left main, circumflex, coronary arteries. Aorta: No significant aortic valve calcifications. Greatest estimated diameter of the annulus measures 34 mm on the coronal reformatted images. Greatest estimated diameter of the sino-tubular junction 45 mm on the coronal reformatted images. The greatest estimated diameter of the ascending aorta on the axial images is 52 mm, although the study is not gated. The most remote CT of 12/23/2004, non gated, demonstrates approximate diameter of 50 mm. Two vessel arch with the innominate artery contributing to the left common carotid artery. No significant atherosclerotic changes of the branch vessels. Cervical vessels patent at the base of the neck. Minimal atherosclerosis of  the descending thoracic aorta. No dissection flap or periaortic fluid. No pedunculated plaque or ulcerated plaque. Diameter at the hiatus measures 28 mm. Pulmonary arteries: Timing of the contrast bolus is not optimized for evaluation of pulmonary artery filling defects. Diameter of the main pulmonary artery estimated 42 mm. There is no significant enlargement of  the distal pulmonary arteries in the outer 1/3 of the lung. Mediastinum/Nodes: No mediastinal adenopathy. Unremarkable appearance of the thoracic esophagus. Unremarkable appearance of the thoracic inlet. Lungs/Pleura: Central airways are clear. No pleural effusion. No confluent airspace disease. No pneumothorax. Upper Abdomen: No acute finding of the upper abdomen. Surgical changes gastric banding and cholecystectomy. Musculoskeletal: No acute displaced fracture. Degenerative changes of the spine. Review of the MIP images confirms the above findings. IMPRESSION: Estimated greatest diameter of the ascending aorta 52 mm on the current study, as above. Within the limits of the non gated study, this is not significantly changed as compared to the most remote CT study of 12/23/2004. Aortic atherosclerosis and coronary artery disease. Aortic Atherosclerosis (ICD10-I70.0). Additional findings as above. Signed, Dulcy Fanny. Dellia Nims, RPVI [facility] Electronically Signed   By: Corrie Mckusick D.O.   On: 02/19/2020 10:25   I have independently reviewed the above radiology studies  and reviewed the findings with the patient.   CT ANGIO CHEST AORTA W/CM & OR WO/CM  Result Date: 04/24/2019 CLINICAL DATA:  Thoracic aortic prominence EXAM: CT ANGIOGRAPHY CHEST WITH CONTRAST TECHNIQUE: Multidetector CT imaging of the chest was performed using the standard protocol during bolus administration of intravenous contrast. Multiplanar CT image reconstructions and MIPs were obtained to evaluate the vascular anatomy. CONTRAST:  59mL ISOVUE-370 IOPAMIDOL (ISOVUE-370) INJECTION 76% COMPARISON:  August 15, 2018 FINDINGS: Cardiovascular: Ascending thoracic aortic diameter is again noted to measure 4.8 x 4.8 cm. The measured diameter of the aorta at the level of the sinuses of Valsalva measures 4.8 cm. Measured diameter at the aortic arch measures 3.7 cm. The descending thoracic aortic  measurement in the mid descending thoracic aortic region measures 2.9 x 2.8 cm. There is no evident thoracic dissection. There is slight calcification at the origin of the left subclavian artery. Great vessels which are visualized otherwise appear normal. There is aortic atherosclerosis. There is coronary artery calcification at several sites. There is no pericardial effusion or pericardial thickening. No pulmonary embolus is evident on this study. Mediastinum/Nodes: Visualized thyroid appears unremarkable. No evident thoracic adenopathy. No esophageal lesions are appreciable. Lungs/Pleura: There is mild bibasilar atelectatic change. A small bulla is noted in the right upper lobe, stable. There is no airspace opacification or edema. No evident pleural effusions. Upper Abdomen: A lap band is noted at the gastric cardia. Gallbladder is absent. Visualized upper abdominal structures otherwise appear unremarkable. Musculoskeletal: There is degenerative change in the thoracic spine with diffuse idiopathic skeletal hyperostosis in the mid to lower thoracic region. No blastic or lytic bone lesions are evident. No evident chest wall lesions. Review of the MIP images confirms the above findings. IMPRESSION: 1. Ascending thoracic aortic diameter is again measured at 4.8 x 4.8 cm. No evident dissection. Foci of aortic atherosclerosis as well as occasional foci of coronary artery calcification noted. Ascending thoracic aortic aneurysm. Recommend semi-annual imaging followup by CTA or MRA and referral to cardiothoracic surgery if not already obtained. This recommendation follows 2010 ACCF/AHA/AATS/ACR/ASA/SCA/SCAI/SIR/STS/SVM Guidelines for the Diagnosis and Management of Patients With Thoracic Aortic Disease. Circulation. 2010; [id number]. Aortic aneurysm NOS (ICD10-I71.9). 2.  No pulmonary  embolus. 3. No edema or airspace opacity. Scattered areas of atelectatic change noted. 4.  No evident adenopathy. 5.  Lap band at  gastric cardia. 6.  Gallbladder absent. 7.  Diffuse idiopathic skeletal hyperostosis in the thoracic region. Aortic aneurysm NOS (ICD10-I71.9). Aortic Atherosclerosis (ICD10-I70.0). Electronically Signed   By: Bretta BangWilliam  Woodruff III M.D.   On: 04/24/2019 12:57     I have independently reviewed the above radiology studies  and reviewed the findings with the patient.   Recent Lab Findings: Lab Results  Component Value Date   WBC 7.3 10/21/2018   HGB 9.5 (L) 10/21/2018   HCT 29.8 (L) 10/21/2018   PLT 125 (L) 10/21/2018   GLUCOSE 133 (H) 11/24/2019   CHOL 118 01/07/2013   TRIG 133 01/07/2013   HDL 40 01/07/2013   LDLCALC 51 01/07/2013   ALT 13 10/21/2018   AST 13 (L) 10/21/2018   NA 138 11/24/2019   K 3.7 11/24/2019   CL 107 11/24/2019   CREATININE 1.11 11/24/2019   BUN 25 (H) 11/24/2019   CO2 22 11/24/2019   TSH 1.29 05/11/2015   INR 1.3 (H) 03/08/2017   HGBA1C 6.7 (H) 10/15/2018   Aortic Size Index=    5.0   /Body surface area is 2.31 meters squared. = 2.25 < 2.75 cm/m2      4% risk per year 2.75 to 4.25          8% risk per year > 4.25 cm/m2    20% risk per year  Cardiac cath February 2019: Procedures   AORTIC ARCH ANGIOGRAPHY  LEFT HEART CATH AND CORONARY ANGIOGRAPHY  Conclusion     Previously placed Ost 2nd Mrg to 2nd Mrg stent (unknown type) is widely patent.  There is no aortic valve regurgitation.   1. No significant obstructive CAD 2. Aneurysmal thoracic aorta.   Plan: continue medical management . Consider pulmonary evaluation for symptoms of dyspnea.    Indications   Chest pain, unspecified type [R07.9 (ICD-10-CM)]  Procedural Details/Technique   Technical Details Indication: 80 yo WM S/p stenting of OM with BMS in 2012. Presents with atypical chest pain and dyspnea. Abnormal Myoview with inferior ischemia  Procedural Details: The left wrist was prepped, draped, and anesthetized with 1% lidocaine. Using the modified Seldinger technique, a 6 French  slender sheath was introduced into the left radial artery. 3 mg of verapamil was administered through the sheath, weight-based unfractionated heparin was administered intravenously. Standard Judkins catheters were used for selective coronary angiography and aortography. Unable to cross the AV due to distortion of the aorta. A LA 1 catheter was used to engage the RCA. A JL6 catheter was used to engage the LCA. Catheter exchanges were performed over an exchange length guidewire. There were no immediate procedural complications. A TR band was used for radial hemostasis at the completion of the procedure. The patient was transferred to the post catheterization recovery area for further monitoring.  Contrast: 97 cc   Estimated blood loss <50 mL.  During this procedure the patient was administered the following to achieve and maintain moderate conscious sedation: Versed 1 mg, Fentanyl 25 mcg, while the patient's heart rate, blood pressure, and oxygen saturation were continuously monitored. The period of conscious sedation was 24 minutes, of which I was present face-to-face 100% of this time.  Complications   Complications documented before study signed (03/13/2017 11:35 AM EST)    No complications were associated with this study.  Documented by [place]az[place]Jordan, [name], MD - 03/13/2017 11:33  AM EST    Coronary Findings   Diagnostic  Dominance: Left  Left Main  Vessel was injected. Vessel is normal in caliber. Vessel is angiographically normal. The vessel originates from a separate ostium.  Left Anterior Descending  Vessel was injected. Vessel is small. Vessel is angiographically normal. There is mild focal disease in the vessel.  Second Septal Branch  Left Circumflex  Vessel was injected. Vessel is large. Vessel is angiographically normal.  Second Obtuse Marginal Branch  Ost 2nd Mrg to 2nd Mrg lesion 0% stenosed  Previously placed Ost 2nd Mrg to 2nd Mrg stent (unknown type) is widely patent.  Right  Coronary Artery  Vessel was injected. Vessel is small. Vessel is angiographically normal.  Intervention   No interventions have been documented.  Left Heart   Aorta Aortic Root: The aortic root is aneurysmal and is enlarged. There is no aortic valve regurgitation.  Coronary Diagrams   Diagnostic Diagram        Echocardiogram February 2019: Echocardiography  Patient:    Patricik, Bowlin MR #:       [id number] Study Date: 03/07/2017 Gender:     M Age:        70 Height:     170.2 cm Weight:     122.9 kg BSA:        2.47 m^2 Pt. Status: Room:   SONOGRAPHER  Victorio Palm, RDCS  ATTENDING    Jory Sims M  ORDERING     Jory Sims M  REFERRING    Jory Sims M  PERFORMING   [facility]  cc:  ------------------------------------------------------------------- LV EF: 55% -   60%  ------------------------------------------------------------------- Indications:      (I25.10). GLS -21.5  ------------------------------------------------------------------- History:   PMH:  Acquired from the patient and from the patient&'s chart.  Chest pressure.  Dyspnea.  Paroxysmal atrial fibrillation. Coronary artery disease.  Congestive heart failure.  Aortic aneurysm.  Risk factors:  Hypertension. Diabetes mellitus. Morbidly obese. Dyslipidemia.  ------------------------------------------------------------------- Study Conclusions  - Left ventricle: The cavity size was normal. Wall thickness was   increased in a pattern of mild LVH. Systolic function was normal.   The estimated ejection fraction was in the range of 55% to 60%.   Wall motion was normal; there were no regional wall motion   abnormalities. Left ventricular diastolic function parameters   were normal. - Aortic valve: There was very mild stenosis. Valve area (VTI):   1.81 cm^2. Valve area (Vmax): 1.94 cm^2. Valve area (Vmean): 1.85   cm^2. - Right atrium: The atrium was mildly  dilated. - Atrial septum: No defect or patent foramen ovale was identified. - Pulmonary arteries: PA peak pressure: 54 mm Hg (S).  ------------------------------------------------------------------- Labs, prior tests, procedures, and surgery: Echocardiography (April 2016).     EF was 55%.  Catheterization (2012).    The study demonstrated coronary artery disease. There was a stenosis in the 2nd obtuse marginal artery which was treated with a bare-metal stent. Cardioversion.  ------------------------------------------------------------------- Study data:  Comparison was made to the study of 2016.  Study status:  Routine.  Procedure:  The patient reported no pain pre or post test. Transthoracic echocardiography for diagnosis, for left ventricular function evaluation, for right ventricular function evaluation, and for assessment of valvular function. Image quality was adequate.  Study completion:  There were no complications.     Echocardiography.  M-mode, complete 2D, spectral Doppler, and color Doppler.  Birthdate:  Patient birthdate: 1940/02/18.  Age: Patient is 80 yr old.  Sex:  Gender: male.    BMI: 42.4 kg/m^2. Blood pressure:     102/56  Patient status:  Outpatient.  Study date:  Study date: 03/07/2017. Study time: 08:39 AM.  Location: [facility]  -------------------------------------------------------------------  ------------------------------------------------------------------- Left ventricle:  The cavity size was normal. Wall thickness was increased in a pattern of mild LVH. Systolic function was normal. The estimated ejection fraction was in the range of 55% to 60%. Wall motion was normal; there were no regional wall motion abnormalities. The transmitral flow pattern was normal. The deceleration time of the early transmitral flow velocity was normal. The pulmonary vein flow pattern was normal. The tissue Doppler parameters were normal. Left ventricular  diastolic function parameters were normal.  ------------------------------------------------------------------- Aortic valve:   Probably trileaflet; mildly thickened, mildly calcified leaflets. Mobility was not restricted.  Doppler:   There was very mild stenosis.   There was no regurgitation.    VTI ratio of LVOT to aortic valve: 0.48. Valve area (VTI): 1.81 cm^2. Indexed valve area (VTI): 0.73 cm^2/m^2. Peak velocity ratio of LVOT to aortic valve: 0.51. Valve area (Vmax): 1.94 cm^2. Indexed valve area (Vmax): 0.78 cm^2/m^2. Mean velocity ratio of LVOT to aortic valve: 0.49. Valve area (Vmean): 1.85 cm^2. Indexed valve area (Vmean): 0.75 cm^2/m^2.    Mean gradient (S): 9 mm Hg. Peak gradient (S): 17 mm Hg.  ------------------------------------------------------------------- Aorta:  The aorta was moderately dilated. Aortic root: The aortic root was normal in size. Ascending aorta: The ascending aorta was mildly dilated.  ------------------------------------------------------------------- Mitral valve:   Mildly thickened leaflets . Mobility was not restricted.  Doppler:  Transvalvular velocity was within the normal range. There was no evidence for stenosis. There was trivial regurgitation.    Peak gradient (D): 3 mm Hg.  ------------------------------------------------------------------- Left atrium:  The atrium was normal in size.  ------------------------------------------------------------------- Atrial septum:  No defect or patent foramen ovale was identified.   ------------------------------------------------------------------- Right ventricle:  The cavity size was normal. Wall thickness was normal. Systolic function was normal.  ------------------------------------------------------------------- Pulmonic valve:    Doppler:  Transvalvular velocity was within the normal range. There was no evidence for stenosis. There was  mild regurgitation.  ------------------------------------------------------------------- Tricuspid valve:   Structurally normal valve.    Doppler: Transvalvular velocity was within the normal range. There was mild regurgitation.  ------------------------------------------------------------------- Pulmonary artery:   The main pulmonary artery was normal-sized. Systolic pressure was within the normal range.  ------------------------------------------------------------------- Right atrium:  The atrium was mildly dilated.  ------------------------------------------------------------------- Pericardium:  The pericardium was normal in appearance. There was no pericardial effusion.  ------------------------------------------------------------------- Systemic veins: Inferior vena cava: The vessel was dilated. The respirophasic diameter changes were blunted (< 50%), consistent with elevated central venous pressure.  ------------------------------------------------------------------- Post procedure conclusions Ascending Aorta:  - The aorta was moderately dilated.  ------------------------------------------------------------------- Measurements   Left ventricle                            Value          Reference  LV ID, ED, PLAX chordal                   50    mm       43 - 52  LV ID, ES, PLAX chordal                   37    mm       23 -  38  LV fx shortening, PLAX chordal    (L)     26    %        >=29  LV PW thickness, ED                       14    mm       ---------  IVS/LV PW ratio, ED                       0.93           <=1.3  Stroke volume, 2D                         93    ml       ---------  Stroke volume/bsa, 2D                     38    ml/m^2   ---------  LV e&', lateral                            5.44  cm/s     ---------  LV E/e&', lateral                          14.61          ---------  LV e&', medial                             5.87  cm/s     ---------  LV  E/e&', medial                           13.54          ---------  LV e&', average                            5.66  cm/s     ---------  LV E/e&', average                          14.06          ---------    Ventricular septum                        Value          Reference  IVS thickness, ED                         13    mm       ---------    LVOT                                      Value          Reference  LVOT ID, S                                22    mm       ---------  LVOT area  3.8   cm^2     ---------  LVOT ID                                   22    mm       ---------  LVOT peak velocity, S                     105   cm/s     ---------  LVOT mean velocity, S                     66.1  cm/s     ---------  LVOT VTI, S                               24.4  cm       ---------  Stroke volume (SV), LVOT DP               92.8  ml       ---------  Stroke index (SV/bsa), LVOT DP            37.5  ml/m^2   ---------    Aortic valve                              Value          Reference  Aortic valve peak velocity, S             206   cm/s     ---------  Aortic valve mean velocity, S             136   cm/s     ---------  Aortic valve VTI, S                       51.2  cm       ---------  Aortic mean gradient, S                   9     mm Hg    ---------  Aortic peak gradient, S                   17    mm Hg    ---------  VTI ratio, LVOT/AV                        0.48           ---------  Aortic valve area, VTI                    1.81  cm^2     ---------  Aortic valve area/bsa, VTI                0.73  cm^2/m^2 ---------  Velocity ratio, peak, LVOT/AV             0.51           ---------  Aortic valve area, peak velocity          1.94  cm^2     ---------  Aortic valve area/bsa, peak               0.78  cm^2/m^2 ---------  velocity  Velocity ratio,  mean, LVOT/AV             0.49           ---------  Aortic valve area, mean velocity          1.85  cm^2      ---------  Aortic valve area/bsa, mean               0.75  cm^2/m^2 ---------  velocity    Aorta                                     Value          Reference  Aortic root ID, ED                        42    mm       ---------  Ascending aorta ID, A-P, S                49    mm       ---------    Left atrium                               Value          Reference  LA ID, A-P, ES                            32    mm       ---------  LA ID/bsa, A-P                            1.29  cm/m^2   <=2.2  LA volume, S                              25.9  ml       ---------  LA volume/bsa, S                          10.5  ml/m^2   ---------  LA volume, ES, 1-p A4C                    25.9  ml       ---------  LA volume/bsa, ES, 1-p A4C                10.5  ml/m^2   ---------  LA volume, ES, 1-p A2C                    24.9  ml       ---------  LA volume/bsa, ES, 1-p A2C                10.1  ml/m^2   ---------    Mitral valve                              Value          Reference  Mitral E-wave peak velocity               79.5  cm/s     ---------  Mitral A-wave peak velocity               105   cm/s     ---------  Mitral deceleration time                  229   ms       150 - 230  Mitral peak gradient, D                   3     mm Hg    ---------  Mitral E/A ratio, peak                    0.8            ---------    Pulmonary arteries                        Value          Reference  PA pressure, S, DP                (H)     54    mm Hg    <=30    Tricuspid valve                           Value          Reference  Tricuspid regurg peak velocity            338   cm/s     ---------  Tricuspid peak RV-RA gradient             46    mm Hg    ---------    Right atrium                              Value          Reference  RA ID, S-I, ES                            48    mm       ---------  RA ID, M-L, ES, A4C                       44    mm       30 - 46    Systemic veins                            Value           Reference  Estimated CVP                             8     mm Hg    ---------    Right ventricle                           Value          Reference  RV ID, minor axis, ED, A4C base           46    mm       ---------  RV ID, minor axis, ED, A4C mid  26    mm       ---------  RV ID, major axis, ED, A4C                57    mm       55 - 91  RV pressure, S, DP                (H)     54    mm Hg    <=30  Legend: (L)  and  (H)  mark values outside specified reference range.  ------------------------------------------------------------------- Prepared and Electronically Authenticated by  Jenkins Rouge, M.D. 2019-02-13T11:50:02  Assessment / Plan:     Stable  5.0  cm ascending aortic dilatation.  Cardiac CT suggests a bileaflet valve,  echocardiogram suggest a trileaflet valve, no significant size change on CT of the chest today  No coronary disease of significance noted on cardiac cath February 2019 Patient remains limited by his shortness of breath, t  With the patient's overall poor medical condition I would not recommend elective repair of his ascending aorta   Plan follow up cta chest 10-12 months    Grace Isaac MD  [place] Office [phone] 02/19/2020 11:02 AM

## 2020-02-19 ENCOUNTER — Encounter: Payer: Self-pay | Admitting: Cardiothoracic Surgery

## 2020-02-19 ENCOUNTER — Ambulatory Visit
Admission: RE | Admit: 2020-02-19 | Discharge: 2020-02-19 | Disposition: A | Discharge status: Home / Self Care | Payer: Medicare Other | Source: Ambulatory Visit | Attending: Cardiothoracic Surgery | Admitting: Cardiothoracic Surgery

## 2020-02-19 ENCOUNTER — Other Ambulatory Visit: Payer: Self-pay

## 2020-02-19 ENCOUNTER — Ambulatory Visit (INDEPENDENT_AMBULATORY_CARE_PROVIDER_SITE_OTHER): Payer: Medicare Other | Admitting: Cardiothoracic Surgery

## 2020-02-19 VITALS — BP 123/66 | HR 60 | Temp 98.1°F | Resp 20 | Ht 66.5 in | Wt 249.8 lb

## 2020-02-19 DIAGNOSIS — I712 Thoracic aortic aneurysm, without rupture, unspecified: Secondary | ICD-10-CM

## 2020-02-19 DIAGNOSIS — I517 Cardiomegaly: Secondary | ICD-10-CM | POA: Diagnosis not present

## 2020-02-19 DIAGNOSIS — I2510 Atherosclerotic heart disease of native coronary artery without angina pectoris: Secondary | ICD-10-CM | POA: Diagnosis not present

## 2020-02-19 DIAGNOSIS — I700 Atherosclerosis of aorta: Secondary | ICD-10-CM | POA: Diagnosis not present

## 2020-02-19 MED ORDER — IOPAMIDOL (ISOVUE-370) INJECTION 76%
75.0000 mL | Freq: Once | INTRAVENOUS | Status: AC | PRN
  Administered 2020-02-19: 75 mL via INTRAVENOUS

## 2020-02-20 DIAGNOSIS — M4810 Ankylosing hyperostosis [Forestier], site unspecified: Secondary | ICD-10-CM | POA: Diagnosis not present

## 2020-02-20 DIAGNOSIS — M4804 Spinal stenosis, thoracic region: Secondary | ICD-10-CM | POA: Diagnosis not present

## 2020-02-20 DIAGNOSIS — M961 Postlaminectomy syndrome, not elsewhere classified: Secondary | ICD-10-CM | POA: Diagnosis not present

## 2020-02-20 DIAGNOSIS — M5417 Radiculopathy, lumbosacral region: Secondary | ICD-10-CM | POA: Diagnosis not present

## 2020-02-23 DIAGNOSIS — M5136 Other intervertebral disc degeneration, lumbar region: Secondary | ICD-10-CM | POA: Diagnosis not present

## 2020-02-23 DIAGNOSIS — M47817 Spondylosis without myelopathy or radiculopathy, lumbosacral region: Secondary | ICD-10-CM | POA: Diagnosis not present

## 2020-02-23 DIAGNOSIS — M1612 Unilateral primary osteoarthritis, left hip: Secondary | ICD-10-CM | POA: Diagnosis not present

## 2020-02-23 DIAGNOSIS — M4316 Spondylolisthesis, lumbar region: Secondary | ICD-10-CM | POA: Diagnosis not present

## 2020-02-23 DIAGNOSIS — Z981 Arthrodesis status: Secondary | ICD-10-CM | POA: Diagnosis not present

## 2020-02-23 DIAGNOSIS — M25552 Pain in left hip: Secondary | ICD-10-CM | POA: Diagnosis not present

## 2020-03-10 DIAGNOSIS — R399 Unspecified symptoms and signs involving the genitourinary system: Secondary | ICD-10-CM | POA: Diagnosis not present

## 2020-03-12 ENCOUNTER — Ambulatory Visit: Payer: Medicare Other | Admitting: Pulmonary Disease

## 2020-03-24 ENCOUNTER — Telehealth: Payer: Self-pay | Admitting: Pulmonary Disease

## 2020-03-24 MED ORDER — PREDNISONE 10 MG PO TABS: ORAL_TABLET | ORAL | 0 refills | Status: DC

## 2020-03-24 NOTE — Telephone Encounter (Signed)
Call made to patient, confirmed DOB. He reports he is having increased congestion for last couple of weeks. Patient saw his PCP and he was given Ceftin. He reports he felt some relief in the beginning but he completes the course today and feels like he needs another few days. Denies fever. Coughing up thick mucous. Mucous is now clear but still thick. Patient confirms using his symbicort twice daily. Patient wife reports he was up all night coughing. Patient states his coug sounds croupy. Requesting recommendations.   SG please advise. Thanks :)

## 2020-03-24 NOTE — Telephone Encounter (Signed)
Complete antibiotic Please send in prednisone taper. Prednisone taper; 10 mg tablets: 4 tabs x 2 days, 3 tabs x 2 days, 2 tabs x 2 days 1 tab x 2 days then stop. Must be seen in follow up in the office within 2 weeks  CXR at that appointment if no improvement  Must seek emergency care or an in office visit with CXR if any worsening. Thanks so much.

## 2020-03-24 NOTE — Telephone Encounter (Signed)
Patient is aware of below recommendations and voiced his understanding. Prednisone has been sent to preferred pharmacy.  Pending appt for 03/31/2020. Nothing further needed at this time.

## 2020-03-31 ENCOUNTER — Other Ambulatory Visit: Payer: Self-pay

## 2020-03-31 ENCOUNTER — Ambulatory Visit (INDEPENDENT_AMBULATORY_CARE_PROVIDER_SITE_OTHER): Payer: Medicare Other | Admitting: Adult Health

## 2020-03-31 ENCOUNTER — Encounter: Payer: Self-pay | Admitting: Adult Health

## 2020-03-31 DIAGNOSIS — J4521 Mild intermittent asthma with (acute) exacerbation: Secondary | ICD-10-CM | POA: Diagnosis not present

## 2020-03-31 DIAGNOSIS — G4733 Obstructive sleep apnea (adult) (pediatric): Secondary | ICD-10-CM

## 2020-03-31 MED ORDER — BENZONATATE 200 MG PO CAPS: 200.0000 mg | ORAL_CAPSULE | Freq: Three times a day (TID) | ORAL | 1 refills | Status: DC | PRN

## 2020-03-31 MED ORDER — PREDNISONE 10 MG PO TABS: 10.0000 mg | ORAL_TABLET | Freq: Every day | ORAL | 0 refills | Status: DC

## 2020-03-31 NOTE — Patient Instructions (Addendum)
Continue on Zyrtec 10mg  At bedtime for 1 week then As needed   Saline nasal rinses As needed   Delsym 2 tsp Twice daily  As needed  Cough/congestion .  Tessalon Three times a day  For cough As needed   Continue on Symbicort 2 puffs Twice daily  , rinse after use.  ProAir As needed wheezing .  Continue on Prednisone 10mg  daily for 5 days then stop  Continue on CPAP At bedtime   Work on healthy weight  Do not drive if sleepy .   Follow up with Dr. Halford Chessman  As planned and As needed   Please contact office for sooner follow up if symptoms do not improve or worsen or seek emergency care

## 2020-03-31 NOTE — Progress Notes (Signed)
@Patient  ID: Carlos Dawson, male    DOB: 1940-08-16, 80 y.o.   MRN: [id number]    Referring provider: Josetta Huddle, MD  HPI: 80 year old male former smoker followed for asthma and obstructive sleep apnea  TEST/EVENTS :  Pulmonary testing:   PFT 12/20/10 >> FEV1 2.13(79%), FEV1% 71, TLC 5.80(100%), DLCO 57%, no BD  Spirometry 05/11/15 >> FEV1 1.68 (55%), FEV1% 69  FeNO 08/13/15 >> 13  Room air SpO2 with exertion 08/13/15 >> 87% - improved with 2 liters oxygen  Chest Imaging:   CT chest 09/14/11 >> Stable dilatation of ascending thoracic aorta 4.7 cm, PA 4.1 cm  CT angio chest 10/01/14 >> aortic dilation  CT angio chest 09/15/16 >> 5 cm ascending TAA  CT angio chest 04/24/19 >> ascending aorta 4.8 x 4.8 cm  Sleep Tests:   PSG 03/30/97 >> AHI 21  PSG 10/21/13 >> AHI 40.1, SaO2 80%, PLMI 75.6  03/31/2020 Follow up : Asthma and OSA  Patient presents for a follow-up visit.  Patient has underlying obstructive sleep apnea.  Says he is doing well on CPAP.  He never misses a night.  Patient wears a CPAP each night around 7 to 8 hours.  CPAP download shows excellent compliance with 100% usage.  Daily average usage around 8 hours.  Patient is on CPAP 10 cm H2O.  AHI is 3.5/hour.  Feels he benefits from CPAP Patient has underlying asthma is on Symbicort twice daily.  Patient says he had a flareup over the last 2 to 3 weeks.  Had increased cough and congestion.  Was seen by his primary care physician and given Ceftin.  He did have some improvement but continued to have ongoing cough and wheezing.  On March 24, 2020.  Patient called in and a prednisone taper was sent to the pharmacy.  Patient has 2 days left of prednisone.  He is starting to feel some better but continues to have some lingering cough and intermittent wheezing.  Recent CT angio chest aorta February 19, 2020 showed clear lungs.  No acute pulmonary issues.     Allergies  Allergen Reactions  . Ace Inhibitors Cough  .  Amoxicillin-Pot Clavulanate     Other reaction(s): GI Upset (intolerance)  . Quinolones     Patient was warned about not using Cipro and similar antibiotics. Recent studies have raised concern that fluoroquinolone antibiotics could be associated with an increased risk of aortic aneurysm Fluoroquinolones have non-antimicrobial properties that might jeopardise the integrity of the extracellular matrix of the vascular wall In a  propensity score matched cohort study in [place], there was a 66% increased rate of aortic aneurysm or dissection associated with oral fluoroquinolone use, compared wit  . Adhesive [Tape] Itching and Rash  . Latex Itching, Rash and Other (See Comments)    Bandaids  . Metformin Diarrhea  . Morphine Itching    Immunization History  Administered Date(s) Administered  . Fluad Quad(high Dose 65+) 11/26/2018  . Influenza Split 12/08/2010, 10/24/2011, 10/23/2012, 10/23/2013, 11/19/2014, 10/19/2015, 11/04/2016, 10/17/2017, 11/26/2018  . Influenza Whole 09/23/2009, 10/23/2011  . Influenza, High Dose Seasonal PF 10/24/2011, 12/04/2013, 10/19/2015, 11/06/2016, 11/13/2016, 10/27/2019  . Influenza,inj,Quad PF,6+ Mos 10/23/2013, 10/24/2014  . PFIZER(Purple Top)SARS-COV-2 Vaccination 02/13/2019, 03/06/2019, 12/29/2019  . Pneumococcal Conjugate-13 04/24/2011, 10/23/2013, 12/04/2013, 01/23/2014  . Pneumococcal Polysaccharide-23 11/22/2010, 05/09/2011  . Pneumococcal-Unspecified 09/23/1997, 04/24/2011  . Tdap 01/25/2008, 05/19/2008  . Typhoid Inactivated 11/02/2015  . Zoster 06/03/2014, 10/24/2014, 07/05/2017  . Zoster Recombinat (Shingrix) 04/05/2017, 07/05/2017    Past  Medical History:  Diagnosis Date  . Anticoagulant long-term use   . Aortic root enlargement ([place])   . Ascending aortic aneurysm [facility])    recent scan in October 2012 showing no change; followed by Dr. Servando Snare  . ASCVD (arteriosclerotic cardiovascular disease)    Prior BMS to the 2nd OM in September 2012; with  repeat cath in October showing patency  . CAD (coronary artery disease)    a. s/p BMS to 2nd OM in Sept 2012; b. LexiScan Myoview (12/2012):  Inf infarct; bowel and motion artifact make study difficult to interpret; no ischemia; not gated; Low Risk  . CHF (congestive heart failure) ([place])    no recent issues 10/13/14  . Chronic back pain    "all over my back" (05/11/2017)  . Colonic polyp   . Contact lens/glasses fitting   . Diastolic dysfunction   . DVT (deep venous thrombosis) ([place])    ?LLE  . Frequent headaches    "probably weekly" (05/11/2017)  . Generalized headaches    neck stenosis  . GERD (gastroesophageal reflux disease)   . Hearing loss   . Hearing loss    more so on left  . Hemorrhoids   . History of stomach ulcers   . Hypertension   . IBS (irritable bowel syndrome)   . LVH (left ventricular hypertrophy)   . Mild intermittent asthma   . OA (osteoarthritis)    "all over" (05/11/2017)  . Obesities, morbid ([place])   . OSA (obstructive sleep apnea)    PSG 03/30/97 AHI 21, BPAP 13/9  . OSA on CPAP   . PAF (paroxysmal atrial fibrillation) ([place])    a. on Xarelto b. s/p DCCV in 08/2016; b. Tikosyn failed 04/16/17 with plans for Multaq and possible Afib ablation with Dr. Rayann Heman  . Pneumonia    'several times" (05/11/2017)  . Prostate CA [facility])    Oncologist  DR. Daralene Milch [facility] 09/24/14, undetermined tx   prostate; S/P "radiation and hormone injections"  . Pulmonary embolism ([place]) 2008   "both lungs"  . SOB (shortness of breath)    on excertion  . Thoracic aortic aneurysm (HCC)    Aortic Size Index=     5.0    /Body surface area is 2.43 meters squared. = 2.05  < 2.75 cm/m2      4% risk per year 2.75 to 4.25          8% risk per year > 4.25 cm/m2    20% risk per year   Stable aneurysmal dilation of the ascending aorta with maximum AP diameter of 4.8 cm. Stable area of narrowing of the proximal most portion of the descending aorta measuring 2 cm., previously identified as an area  of coarctation. No evidence of aortic dissection.  Coronary artery disease.  Normal appearance of the lungs.   Electronically Signed   By: Fidela Salisbury M.D.   On: 10/01/2014 08:50    . Type II diabetes mellitus (HCC)    metphormin, average 154 dx 2017    Tobacco History: Social History   Tobacco Use  Smoking Status Former Smoker  . Packs/day: 1.50  . Years: 30.00  . Pack years: 45.00  . Types: Cigarettes  . Start date: 31  . Quit date: 01/24/1992  . Years since quitting: 28.2  Smokeless Tobacco Never Used   Counseling given: Not Answered   Outpatient Medications Prior to Visit  Medication Sig Dispense Refill  . acetaminophen (TYLENOL) 500 MG tablet Take 1,000 mg by  mouth every 8 (eight) hours as needed for mild pain or headache.     . albuterol (PROVENTIL HFA;VENTOLIN HFA) 108 (90 Base) MCG/ACT inhaler Inhale 2 puffs into the lungs every 4 (four) hours as needed for wheezing or shortness of breath.    Marland Kitchen atorvastatin (LIPITOR) 10 MG tablet Take 5 mg by mouth daily at 6 PM.     . augmented betamethasone dipropionate (DIPROLENE-AF) 0.05 % cream Apply 1 application topically 2 (two) times daily as needed for itching.  3  . budesonide-formoterol (SYMBICORT) 80-4.5 MCG/ACT inhaler Inhale 2 puffs into the lungs 2 (two) times daily as needed (for shortness of breath).     . celecoxib (CELEBREX) 200 MG capsule Take 200 mg by mouth 2 (two) times daily as needed for moderate pain.  0  . Cholecalciferol (VITAMIN D-3) 5000 units TABS Take 5,000 Units by mouth daily.    . Coenzyme Q10 100 MG capsule Take 100 mg by mouth daily.     . furosemide (LASIX) 80 MG tablet Take 80 mg by mouth daily. Take 80 mg in the morning     . JARDIANCE 25 MG TABS tablet Take 1 tablet by mouth daily.    Marland Kitchen KLOR-CON M20 20 MEQ tablet TAKE 2 TABLETS BY MOUTH TWICE A DAY (Patient taking differently: Take 60 mEq by mouth daily.) 60 tablet 1  . losartan (COZAAR) 25 MG tablet TAKE 1 TABLET BY MOUTH EVERY DAY 90  tablet 3  . methocarbamol (ROBAXIN) 500 MG tablet Take 1 tablet (500 mg total) by mouth every 8 (eight) hours as needed for muscle spasms. 60 tablet 1  . metoprolol tartrate (LOPRESSOR) 25 MG tablet Take 12.5 mg by mouth 2 (two) times daily.    . montelukast (SINGULAIR) 10 MG tablet Take 10 mg by mouth at bedtime.    . nitroGLYCERIN (NITROSTAT) 0.4 MG SL tablet Place 1 tablet (0.4 mg total) under the tongue every 5 (five) minutes as needed for chest pain. 25 tablet 5  . pantoprazole (PROTONIX) 20 MG tablet Take 20 mg by mouth daily.    Vladimir Faster Glycol-Propyl Glycol (SYSTANE OP) Place 1 drop into both eyes daily as needed (for dry eyes).     . psyllium (METAMUCIL) 58.6 % packet Take 1 packet by mouth daily.    . rivaroxaban (XARELTO) 20 MG TABS tablet Take 1 tablet (20 mg total) by mouth daily with supper. 30 tablet   . sildenafil (VIAGRA) 100 MG tablet Take 100 mg by mouth as needed for erectile dysfunction.     . Skin Protectants, Misc. (EUCERIN) cream Apply 1 application topically as needed for dry skin.    Marland Kitchen spironolactone (ALDACTONE) 25 MG tablet Take 25 mg by mouth daily.    . tamsulosin (FLOMAX) 0.4 MG CAPS Take 0.4 mg by mouth every evening.     . topiramate (TOPAMAX) 25 MG capsule Take 50 mg by mouth 2 (two) times daily.     . predniSONE (DELTASONE) 10 MG tablet 4tab x2d, 3tab x2d, 2tab x2d, 1tab x1d 20 tablet 0   No facility-administered medications prior to visit.     Review of Systems:   Constitutional:   No  weight loss, night sweats,  Fevers, chills, + fatigue, or  lassitude.  HEENT:   No headaches,  Difficulty swallowing,  Tooth/dental problems, or  Sore throat,                No sneezing, itching, ear ache,  +nasal congestion, post nasal drip,  CV:  No chest pain,  Orthopnea, PND, swelling in lower extremities, anasarca, dizziness, palpitations, syncope.   GI  No heartburn, indigestion, abdominal pain, nausea, vomiting, diarrhea, change in bowel habits, loss of  appetite, bloody stools.   Resp:    No chest wall deformity  Skin: no rash or lesions.  GU: no dysuria, change in color of urine, no urgency or frequency.  No flank pain, no hematuria   MS:  No joint pain or swelling.  No decreased range of motion.  No back pain.    Physical Exam  BP 120/70 (BP Location: Left Arm, Cuff Size: Large)   Pulse (!) 59   Temp (!) 97.4 F (36.3 C) (Temporal)   Ht 5' 6.5" (1.689 m)   Wt 245 lb (111.1 kg)   SpO2 94% Comment: RA  BMI 38.95 kg/m   GEN: A/Ox3; pleasant , NAD, well nourished    HEENT:  [place]/AT,  NOSE-clear, THROAT-clear, no lesions, no postnasal drip or exudate noted.   NECK:  Supple w/ fair ROM; no JVD; normal carotid impulses w/o bruits; no thyromegaly or nodules palpated; no lymphadenopathy.    RESP  Clear  P & A; w/o, wheezes/ rales/ or rhonchi. no accessory muscle use, no dullness to percussion  CARD:  RRR, no m/r/g, no peripheral edema, pulses intact, no cyanosis or clubbing.  GI:   Soft & nt; nml bowel sounds; no organomegaly or masses detected.   Musco: Warm bil, no deformities or joint swelling noted.   Neuro: alert, no focal deficits noted.    Skin: Warm, no lesions or rashes    Lab Results:  CBC  BMET  No results found.    No flowsheet data found.  Lab Results  Component Value Date   NITRICOXIDE 13 08/13/2015        Assessment & Plan:   No problem-specific Assessment & Plan notes found for this encounter.     Rexene Edison, NP 03/31/2020

## 2020-04-02 NOTE — Assessment & Plan Note (Signed)
Slow to resolve asthmatic bronchitic exacerbation-is starting to make some progress.  Will extend prednisone out for a few additional days.  Plan  Patient Instructions  Continue on Zyrtec 10mg  At bedtime for 1 week then As needed   Saline nasal rinses As needed   Delsym 2 tsp Twice daily  As needed  Cough/congestion .  Tessalon Three times a day  For cough As needed   Continue on Symbicort 2 puffs Twice daily  , rinse after use.  ProAir As needed wheezing .  Continue on Prednisone 10mg  daily for 5 days then stop  Continue on CPAP At bedtime   Work on healthy weight  Do not drive if sleepy .   Follow up with Dr. Halford Chessman  As planned and As needed   Please contact office for sooner follow up if symptoms do not improve or worsen or seek emergency care

## 2020-04-02 NOTE — Assessment & Plan Note (Signed)
Excellent control and compliance  Plan  Patient Instructions  Continue on Zyrtec 10mg  At bedtime for 1 week then As needed   Saline nasal rinses As needed   Delsym 2 tsp Twice daily  As needed  Cough/congestion .  Tessalon Three times a day  For cough As needed   Continue on Symbicort 2 puffs Twice daily  , rinse after use.  ProAir As needed wheezing .  Continue on Prednisone 10mg  daily for 5 days then stop  Continue on CPAP At bedtime   Work on healthy weight  Do not drive if sleepy .   Follow up with Dr. Halford Chessman  As planned and As needed   Please contact office for sooner follow up if symptoms do not improve or worsen or seek emergency care

## 2020-04-06 DIAGNOSIS — Z5181 Encounter for therapeutic drug level monitoring: Secondary | ICD-10-CM | POA: Diagnosis not present

## 2020-04-06 DIAGNOSIS — M25552 Pain in left hip: Secondary | ICD-10-CM | POA: Diagnosis not present

## 2020-04-06 DIAGNOSIS — Z79899 Other long term (current) drug therapy: Secondary | ICD-10-CM | POA: Diagnosis not present

## 2020-04-06 DIAGNOSIS — M4804 Spinal stenosis, thoracic region: Secondary | ICD-10-CM | POA: Diagnosis not present

## 2020-04-06 DIAGNOSIS — M47817 Spondylosis without myelopathy or radiculopathy, lumbosacral region: Secondary | ICD-10-CM | POA: Diagnosis not present

## 2020-04-06 DIAGNOSIS — M5417 Radiculopathy, lumbosacral region: Secondary | ICD-10-CM | POA: Diagnosis not present

## 2020-04-06 DIAGNOSIS — M4810 Ankylosing hyperostosis [Forestier], site unspecified: Secondary | ICD-10-CM | POA: Diagnosis not present

## 2020-04-06 DIAGNOSIS — M961 Postlaminectomy syndrome, not elsewhere classified: Secondary | ICD-10-CM | POA: Diagnosis not present

## 2020-04-06 DIAGNOSIS — G8929 Other chronic pain: Secondary | ICD-10-CM | POA: Diagnosis not present

## 2020-04-06 DIAGNOSIS — M1612 Unilateral primary osteoarthritis, left hip: Secondary | ICD-10-CM | POA: Diagnosis not present

## 2020-04-15 ENCOUNTER — Ambulatory Visit (INDEPENDENT_AMBULATORY_CARE_PROVIDER_SITE_OTHER): Payer: Medicare Other | Admitting: Pulmonary Disease

## 2020-04-15 ENCOUNTER — Encounter: Payer: Self-pay | Admitting: Pulmonary Disease

## 2020-04-15 ENCOUNTER — Ambulatory Visit (INDEPENDENT_AMBULATORY_CARE_PROVIDER_SITE_OTHER): Payer: Medicare Other

## 2020-04-15 ENCOUNTER — Other Ambulatory Visit: Payer: Self-pay

## 2020-04-15 VITALS — BP 100/60 | HR 60 | Temp 97.6°F | Ht 66.0 in | Wt 240.6 lb

## 2020-04-15 DIAGNOSIS — R059 Cough, unspecified: Secondary | ICD-10-CM

## 2020-04-15 DIAGNOSIS — G4733 Obstructive sleep apnea (adult) (pediatric): Secondary | ICD-10-CM | POA: Diagnosis not present

## 2020-04-15 DIAGNOSIS — J449 Chronic obstructive pulmonary disease, unspecified: Secondary | ICD-10-CM | POA: Diagnosis not present

## 2020-04-15 MED ORDER — BREZTRI AEROSPHERE 160-9-4.8 MCG/ACT IN AERO: 2.0000 | INHALATION_SPRAY | Freq: Two times a day (BID) | RESPIRATORY_TRACT | 0 refills | Status: DC

## 2020-04-15 NOTE — Progress Notes (Signed)
[facility]  Chief Complaint  Patient presents with  . Follow-up    Patient is done with recent Prednisone, states he is still having some tightness in chest, cough at night. Patient states that he gets short of breath very easy.    Constitutional:  BP 100/60 (BP Location: Left Arm, Patient Position: Sitting, Cuff Size: Normal)   Pulse 60   Temp 97.6 F (36.4 C) (Temporal)   Ht 5\' 6"  (1.676 m)   Wt 240 lb 9.6 oz (109.1 kg)   SpO2 97%   BMI 38.83 kg/m   Past Medical History:  Ascending aorta dilation, CAD s/p stent, Diastolic CHF, HTN,  A fib, Colon polyps, DVT, PE, Headaches, GERD, PUD, IBS, OA, Pneumonia, Prostate cancer, DM type 2  Past Surgical History:  His  has a past surgical history that includes Hemorroidectomy; Vasectomy; Cervical spine surgery (06/02/2010); Achilles tendon repair (Bilateral); Coronary stent placement (Sept 2012); Appendectomy; Laminectomy (05/30/2012); left and right heart catheterization with coronary angiogram (N/A, 05/07/2014); Laparoscopic gastric banding; Uvulopalatopharyngoplasty; Colonoscopy with propofol (N/A, 12/29/2014); Carpal tunnel release (Bilateral); Trigger finger release; Lumbar laminectomy/decompression microdiscectomy (N/A, 05/04/2016); Posterior lumbar fusion (10/18/2016); Laminectomy with posterior lateral arthrodesis level 3 (N/A, 10/18/2016); LEFT HEART CATH AND CORONARY ANGIOGRAPHY (N/A, 03/13/2017); AORTIC ARCH ANGIOGRAPHY (N/A, 03/13/2017); ATRIAL FIBRILLATION ABLATION (05/11/2017); Laparoscopic cholecystectomy; Shoulder open rotator cuff repair (Bilateral); Back surgery; Cataract extraction w/ intraocular lens  implant, bilateral (Bilateral); Tonsillectomy and adenoidectomy; Cardiac catheterization (2006); Coronary angioplasty with stent (October 2012); ATRIAL FIBRILLATION ABLATION (N/A, 05/11/2017); Esophagogastroduodenoscopy (N/A, 03/14/2018); Colonoscopy (N/A, 03/14/2018); biopsy (03/14/2018); polypectomy  (03/14/2018); and Total hip arthroplasty (Right, 10/18/2018).  Brief Summary:  Carlos Dawson is a 80 y.o. male former smoker with dyspnea, OSA, and asthma.      Subjective:   He hasn't received new CPAP machine yet.  He was treated for an exacerbation a few weeks ago.  Still has shortness of breath and chest discomfort.  Not much cough or sputum.  Physical Exam:   Appearance - well kempt   ENMT - no sinus tenderness, no oral exudate, no LAN, Mallampati 3 airway, no stridor  Respiratory - equal breath sounds bilaterally, no wheezing or rales  CV - s1s2 regular rate and rhythm, no murmurs  Ext - no clubbing, no edema  Skin - no rashes  Psych - normal mood and affect    Pulmonary testing:   PFT 12/20/10 >> FEV1 2.13(79%), FEV1% 71, TLC 5.80(100%), DLCO 57%, no BD  Spirometry 05/11/15 >> FEV1 1.68 (55%), FEV1% 69  FeNO 08/13/15 >> 13  Room air SpO2 with exertion 08/13/15 >> 87% - improved with 2 liters oxygen  Chest Imaging:   CT chest 09/14/11 >> Stable dilatation of ascending thoracic aorta 4.7 cm, PA 4.1 cm  CT angio chest 10/01/14 >> aortic dilation  CT angio chest 09/15/16 >> 5 cm ascending TAA  CT angio chest 04/24/19 >> ascending aorta 4.8 x 4.8 cm  Sleep Tests:   PSG 03/30/97 >> AHI 21  PSG 10/21/13 >> AHI 40.1, SaO2 80%, PLMI 75.6  CPAP 12/28/13 >>CPAP 10 cm H2O >>AHI 1.5, +R   ONO with CPAP 08/19/15 >>test time 8 hrs 40 min. Basal SpO2 92.3%, low SpO2 86%. Spent 1.3 min with SpO2 <88%.  CPAP 03/15/20 to 04/13/20 >> used on 30 of 30 nights with average 8 hrs 15 min.  Average AHI 6 with CPAP 10 cm H2O  Cardiac Tests:   [facility] 03/13/17 >> patent Ost 2nd Mrg  stent  Cardiopulmonary exercise test 09/26/17 >> deconditioning  Echo 10/17/17 >> EF 60 to 65%, grade 2 DD  Social History:  He  reports that he quit smoking about 28 years ago. His smoking use included cigarettes. He started smoking about 64 years ago. He has a 45.00 pack-year smoking history. He  has never used smokeless tobacco. He reports that he does not drink alcohol and does not use drugs.  Family History:  His family history includes Diabetes in his mother; Emphysema (age of onset: 76) in his father; Heart attack in his sister; Heart disease in his mother; Other in his mother.     Assessment/Plan:   COPD with asthma. - still recovering from recent exacerbation - repeat chest xray today - will have him try sample of breztri in place of symbicort, and he will let us know if he would prefer to continue breztri - continue singulair - prn albuterol  Obstructive sleep apnea. - he is compliant with CPAP and reports benefit from therapy - uses [employer] for his DME - continue CPAP 10 cm H2O - will have [place] check on status of replacement CPAP machine  Allergic rhinitis with upper airway cough syndrome. - continue singulair  A fib s/p ablation, chronic diastolic CHF. - followed by Dr. Peter Martinique with [facility]  Ascending thoracic aortic aneurysm. - f/u with Dr. Servando Snare  Time Spent Involved in Patient Care on Day of Examination:  34 minutes  Follow up:  Patient Instructions  Try using breztri two puffs twice per day, and rinse your mouth after each use  Don't use symbicort while using breztri  Call after breztri sample completed if you would like a refill for this  Chest xray today  Follow up in 6 to 8 weeks   Medication List:   Allergies as of 04/15/2020      Reactions   Ace Inhibitors Cough   Amoxicillin-pot Clavulanate    Other reaction(s): GI Upset (intolerance)   Quinolones    Patient was warned about not using Cipro and similar antibiotics. Recent studies have raised concern that fluoroquinolone antibiotics could be associated with an increased risk of aortic aneurysm Fluoroquinolones have non-antimicrobial properties that might jeopardise the integrity of the extracellular matrix of the vascular wall In a  propensity score matched cohort  study in [place], there was a 66% increased rate of aortic aneurysm or dissection associated with oral fluoroquinolone use, compared wit   Adhesive [tape] Itching, Rash   Latex Itching, Rash, Other (See Comments)   Bandaids   Metformin Diarrhea   Morphine Itching      Medication List       Accurate as of April 15, 2020  1:35 PM. If you have any questions, ask your nurse or doctor.        STOP taking these medications   predniSONE 10 MG tablet Commonly known as: DELTASONE Stopped by: Chesley Mires, MD     TAKE these medications   acetaminophen 500 MG tablet Commonly known as: TYLENOL Take 1,000 mg by mouth every 8 (eight) hours as needed for mild pain or headache.   albuterol 108 (90 Base) MCG/ACT inhaler Commonly known as: VENTOLIN HFA Inhale 2 puffs into the lungs every 4 (four) hours as needed for wheezing or shortness of breath.   atorvastatin 10 MG tablet Commonly known as: LIPITOR Take 5 mg by mouth daily at 6 PM.   augmented betamethasone dipropionate 0.05 % cream Commonly known as: DIPROLENE-AF Apply 1 application topically  2 (two) times daily as needed for itching.   benzonatate 200 MG capsule Commonly known as: TESSALON Take 1 capsule (200 mg total) by mouth 3 (three) times daily as needed for cough.   Breztri Aerosphere 160-9-4.8 MCG/ACT Aero Generic drug: Budeson-Glycopyrrol-Formoterol Inhale 2 puffs into the lungs 2 (two) times daily. Started by: Chesley Mires, MD   budesonide-formoterol 80-4.5 MCG/ACT inhaler Commonly known as: SYMBICORT Inhale 2 puffs into the lungs 2 (two) times daily as needed (for shortness of breath).   celecoxib 200 MG capsule Commonly known as: CELEBREX Take 200 mg by mouth 2 (two) times daily as needed for moderate pain.   Coenzyme Q10 100 MG capsule Take 100 mg by mouth daily.   eucerin cream Apply 1 application topically as needed for dry skin.   furosemide 80 MG tablet Commonly known as: LASIX Take 80 mg by mouth  daily. Take 80 mg in the morning   Jardiance 25 MG Tabs tablet Generic drug: empagliflozin Take 1 tablet by mouth daily.   Klor-Con M20 20 MEQ tablet Generic drug: potassium chloride SA TAKE 2 TABLETS BY MOUTH TWICE A DAY What changed:   how much to take  when to take this   losartan 25 MG tablet Commonly known as: COZAAR TAKE 1 TABLET BY MOUTH EVERY DAY   methocarbamol 500 MG tablet Commonly known as: ROBAXIN Take 1 tablet (500 mg total) by mouth every 8 (eight) hours as needed for muscle spasms.   metoprolol tartrate 25 MG tablet Commonly known as: LOPRESSOR Take 12.5 mg by mouth 2 (two) times daily.   montelukast 10 MG tablet Commonly known as: SINGULAIR Take 10 mg by mouth at bedtime.   nitroGLYCERIN 0.4 MG SL tablet Commonly known as: Nitrostat Place 1 tablet (0.4 mg total) under the tongue every 5 (five) minutes as needed for chest pain.   pantoprazole 20 MG tablet Commonly known as: PROTONIX Take 20 mg by mouth daily.   psyllium 58.6 % packet Commonly known as: METAMUCIL Take 1 packet by mouth daily.   rivaroxaban 20 MG Tabs tablet Commonly known as: XARELTO Take 1 tablet (20 mg total) by mouth daily with supper.   sildenafil 100 MG tablet Commonly known as: VIAGRA Take 100 mg by mouth as needed for erectile dysfunction.   spironolactone 25 MG tablet Commonly known as: ALDACTONE Take 25 mg by mouth daily.   SYSTANE OP Place 1 drop into both eyes daily as needed (for dry eyes).   tamsulosin 0.4 MG Caps capsule Commonly known as: FLOMAX Take 0.4 mg by mouth every evening.   topiramate 25 MG capsule Commonly known as: TOPAMAX Take 50 mg by mouth 2 (two) times daily.   Vitamin D-3 125 MCG (5000 UT) Tabs Take 5,000 Units by mouth daily.       Signature:  Chesley Mires, MD [place] Pager - [phone] 04/15/2020, 1:35 PM

## 2020-04-15 NOTE — Patient Instructions (Signed)
Try using breztri two puffs twice per day, and rinse your mouth after each use  Don't use symbicort while using breztri  Call after breztri sample completed if you would like a refill for this  Chest xray today  Follow up in 6 to 8 weeks

## 2020-04-21 ENCOUNTER — Encounter: Payer: Self-pay | Admitting: *Deleted

## 2020-04-21 DIAGNOSIS — I5030 Unspecified diastolic (congestive) heart failure: Secondary | ICD-10-CM | POA: Diagnosis not present

## 2020-04-21 DIAGNOSIS — J4530 Mild persistent asthma, uncomplicated: Secondary | ICD-10-CM | POA: Diagnosis not present

## 2020-04-21 DIAGNOSIS — E119 Type 2 diabetes mellitus without complications: Secondary | ICD-10-CM | POA: Diagnosis not present

## 2020-04-21 DIAGNOSIS — N183 Chronic kidney disease, stage 3 unspecified: Secondary | ICD-10-CM | POA: Diagnosis not present

## 2020-04-21 DIAGNOSIS — I480 Paroxysmal atrial fibrillation: Secondary | ICD-10-CM | POA: Diagnosis not present

## 2020-04-21 DIAGNOSIS — E785 Hyperlipidemia, unspecified: Secondary | ICD-10-CM | POA: Diagnosis not present

## 2020-04-21 DIAGNOSIS — G8929 Other chronic pain: Secondary | ICD-10-CM | POA: Diagnosis not present

## 2020-04-21 DIAGNOSIS — I10 Essential (primary) hypertension: Secondary | ICD-10-CM | POA: Diagnosis not present

## 2020-04-21 DIAGNOSIS — K219 Gastro-esophageal reflux disease without esophagitis: Secondary | ICD-10-CM | POA: Diagnosis not present

## 2020-04-21 DIAGNOSIS — E291 Testicular hypofunction: Secondary | ICD-10-CM | POA: Diagnosis not present

## 2020-04-21 DIAGNOSIS — Z87891 Personal history of nicotine dependence: Secondary | ICD-10-CM | POA: Diagnosis not present

## 2020-04-21 DIAGNOSIS — J45909 Unspecified asthma, uncomplicated: Secondary | ICD-10-CM | POA: Diagnosis not present

## 2020-04-21 DIAGNOSIS — Z08 Encounter for follow-up examination after completed treatment for malignant neoplasm: Secondary | ICD-10-CM | POA: Diagnosis not present

## 2020-04-21 DIAGNOSIS — M549 Dorsalgia, unspecified: Secondary | ICD-10-CM | POA: Diagnosis not present

## 2020-04-21 DIAGNOSIS — G4700 Insomnia, unspecified: Secondary | ICD-10-CM | POA: Diagnosis not present

## 2020-04-21 DIAGNOSIS — Z8546 Personal history of malignant neoplasm of prostate: Secondary | ICD-10-CM | POA: Diagnosis not present

## 2020-04-21 DIAGNOSIS — I2510 Atherosclerotic heart disease of native coronary artery without angina pectoris: Secondary | ICD-10-CM | POA: Diagnosis not present

## 2020-04-21 DIAGNOSIS — N400 Enlarged prostate without lower urinary tract symptoms: Secondary | ICD-10-CM | POA: Diagnosis not present

## 2020-04-27 DIAGNOSIS — I5030 Unspecified diastolic (congestive) heart failure: Secondary | ICD-10-CM | POA: Diagnosis not present

## 2020-04-27 DIAGNOSIS — D6869 Other thrombophilia: Secondary | ICD-10-CM | POA: Diagnosis not present

## 2020-04-27 DIAGNOSIS — K219 Gastro-esophageal reflux disease without esophagitis: Secondary | ICD-10-CM | POA: Diagnosis not present

## 2020-04-27 DIAGNOSIS — E1165 Type 2 diabetes mellitus with hyperglycemia: Secondary | ICD-10-CM | POA: Diagnosis not present

## 2020-04-27 DIAGNOSIS — E559 Vitamin D deficiency, unspecified: Secondary | ICD-10-CM | POA: Diagnosis not present

## 2020-04-27 DIAGNOSIS — E0822 Diabetes mellitus due to underlying condition with diabetic chronic kidney disease: Secondary | ICD-10-CM | POA: Diagnosis not present

## 2020-04-27 DIAGNOSIS — I2510 Atherosclerotic heart disease of native coronary artery without angina pectoris: Secondary | ICD-10-CM | POA: Diagnosis not present

## 2020-04-27 DIAGNOSIS — I480 Paroxysmal atrial fibrillation: Secondary | ICD-10-CM | POA: Diagnosis not present

## 2020-04-27 DIAGNOSIS — E785 Hyperlipidemia, unspecified: Secondary | ICD-10-CM | POA: Diagnosis not present

## 2020-04-27 DIAGNOSIS — I10 Essential (primary) hypertension: Secondary | ICD-10-CM | POA: Diagnosis not present

## 2020-04-27 DIAGNOSIS — J4530 Mild persistent asthma, uncomplicated: Secondary | ICD-10-CM | POA: Diagnosis not present

## 2020-04-27 DIAGNOSIS — D509 Iron deficiency anemia, unspecified: Secondary | ICD-10-CM | POA: Diagnosis not present

## 2020-05-24 DIAGNOSIS — L821 Other seborrheic keratosis: Secondary | ICD-10-CM | POA: Diagnosis not present

## 2020-05-24 DIAGNOSIS — L718 Other rosacea: Secondary | ICD-10-CM | POA: Diagnosis not present

## 2020-05-27 DIAGNOSIS — R195 Other fecal abnormalities: Secondary | ICD-10-CM | POA: Insufficient documentation

## 2020-05-27 DIAGNOSIS — R0981 Nasal congestion: Secondary | ICD-10-CM | POA: Insufficient documentation

## 2020-05-27 DIAGNOSIS — D649 Anemia, unspecified: Secondary | ICD-10-CM | POA: Insufficient documentation

## 2020-05-27 DIAGNOSIS — R7301 Impaired fasting glucose: Secondary | ICD-10-CM | POA: Insufficient documentation

## 2020-05-27 DIAGNOSIS — Z860100 Personal history of colon polyps, unspecified: Secondary | ICD-10-CM | POA: Insufficient documentation

## 2020-05-27 DIAGNOSIS — R69 Illness, unspecified: Secondary | ICD-10-CM | POA: Insufficient documentation

## 2020-05-27 DIAGNOSIS — F411 Generalized anxiety disorder: Secondary | ICD-10-CM | POA: Insufficient documentation

## 2020-05-27 DIAGNOSIS — M791 Myalgia, unspecified site: Secondary | ICD-10-CM | POA: Insufficient documentation

## 2020-05-27 DIAGNOSIS — R1032 Left lower quadrant pain: Secondary | ICD-10-CM | POA: Insufficient documentation

## 2020-05-27 DIAGNOSIS — M7580 Other shoulder lesions, unspecified shoulder: Secondary | ICD-10-CM | POA: Insufficient documentation

## 2020-05-27 DIAGNOSIS — E559 Vitamin D deficiency, unspecified: Secondary | ICD-10-CM | POA: Insufficient documentation

## 2020-05-27 DIAGNOSIS — I499 Cardiac arrhythmia, unspecified: Secondary | ICD-10-CM | POA: Insufficient documentation

## 2020-05-27 DIAGNOSIS — D509 Iron deficiency anemia, unspecified: Secondary | ICD-10-CM | POA: Insufficient documentation

## 2020-05-27 DIAGNOSIS — G4700 Insomnia, unspecified: Secondary | ICD-10-CM | POA: Insufficient documentation

## 2020-05-27 DIAGNOSIS — M25579 Pain in unspecified ankle and joints of unspecified foot: Secondary | ICD-10-CM | POA: Insufficient documentation

## 2020-05-27 DIAGNOSIS — N183 Chronic kidney disease, stage 3 unspecified: Secondary | ICD-10-CM | POA: Insufficient documentation

## 2020-05-27 DIAGNOSIS — Z8546 Personal history of malignant neoplasm of prostate: Secondary | ICD-10-CM | POA: Insufficient documentation

## 2020-05-27 DIAGNOSIS — K521 Toxic gastroenteritis and colitis: Secondary | ICD-10-CM | POA: Insufficient documentation

## 2020-05-27 DIAGNOSIS — D6869 Other thrombophilia: Secondary | ICD-10-CM | POA: Insufficient documentation

## 2020-05-27 DIAGNOSIS — N400 Enlarged prostate without lower urinary tract symptoms: Secondary | ICD-10-CM | POA: Insufficient documentation

## 2020-05-27 DIAGNOSIS — T800XXA Air embolism following infusion, transfusion and therapeutic injection, initial encounter: Secondary | ICD-10-CM | POA: Insufficient documentation

## 2020-05-27 DIAGNOSIS — I4819 Other persistent atrial fibrillation: Secondary | ICD-10-CM | POA: Insufficient documentation

## 2020-05-27 DIAGNOSIS — L02419 Cutaneous abscess of limb, unspecified: Secondary | ICD-10-CM | POA: Insufficient documentation

## 2020-05-27 DIAGNOSIS — K591 Functional diarrhea: Secondary | ICD-10-CM | POA: Insufficient documentation

## 2020-05-28 ENCOUNTER — Ambulatory Visit (INDEPENDENT_AMBULATORY_CARE_PROVIDER_SITE_OTHER): Payer: Medicare Other | Admitting: Pulmonary Disease

## 2020-05-28 ENCOUNTER — Encounter: Payer: Self-pay | Admitting: Pulmonary Disease

## 2020-05-28 ENCOUNTER — Other Ambulatory Visit: Payer: Self-pay

## 2020-05-28 VITALS — BP 112/62 | HR 52 | Ht 66.0 in | Wt 249.0 lb

## 2020-05-28 DIAGNOSIS — J449 Chronic obstructive pulmonary disease, unspecified: Secondary | ICD-10-CM | POA: Diagnosis not present

## 2020-05-28 DIAGNOSIS — G4733 Obstructive sleep apnea (adult) (pediatric): Secondary | ICD-10-CM | POA: Diagnosis not present

## 2020-05-28 DIAGNOSIS — Z9989 Dependence on other enabling machines and devices: Secondary | ICD-10-CM | POA: Diagnosis not present

## 2020-05-28 MED ORDER — BREZTRI AEROSPHERE 160-9-4.8 MCG/ACT IN AERO
2.0000 | INHALATION_SPRAY | Freq: Two times a day (BID) | RESPIRATORY_TRACT | 5 refills | Status: DC
Start: 2020-05-28 — End: 2020-08-10

## 2020-05-28 NOTE — Patient Instructions (Signed)
Follow up in 4 months 

## 2020-05-28 NOTE — Progress Notes (Deleted)
Cardiology Office Note    Date:  05/28/2020   ID:  Carlos Dawson, DOB [birth year], MRN [id number]  PCP:  Josetta Huddle, MD  Cardiologist: Dr. Preeya Cleckley [place]   No chief complaint on file.   History of Present Illness:    Carlos Dawson is a 80 y.o. male with past medical history of CAD (s/p BMS to 2nd OM in 2012, low-risk NST in 12/2012), PAF (on Xarelto), chronic diastolic CHF, prior PE, HTN, HLD, Type 2 DM, and known thoracic aortic aneurysm who is seen for follow.  He was admitted from 8/23 - 09/15/2016 for evaluation of chest discomfort and palpitations. His initial EKG showed that he was in atrial fibrillation with RVR. He reported good compliance with his Xarelto and denied missing any doses, therefore a successful DCCV was performed in the ED at [facility].  He became hypotensive with SBP in the 80's following the procedure, therefore he was admitted overnight for further observation. His Losartan, Spironolactone, and Lasix were held at the time of discharge with plans to resume at follow-up if BP allowed. A CTA was also obtained during admission to rule out a recurrent PE and was negative for a PE but did show a stable 5.0 cm ascending thoracic aortic aneurysm. Antihypertensive therapy was resumed. On 10/20/16 he underwent L1-5 fusion by Dr. Ronnald Ramp.   On follow up in February 2019   he noted increased symptoms of dyspnea on exertion and chest pressure for 3 months. Some improvement with inhalers.  No increase in edema or weight.  Oxygen levels were good. He does use CPAP. Myoview study was mildly abnormal with inferoapical reversible defect. EF 61%. Echo showed normal EF with moderate pulmonary HTN. Subsequent cardiac cath showed no obstructive CAD and no AI. Unable to cross AV due to distortion of the aortic root.  This spring he developed increased symptoms of arrhythmia despite Tikosyn. He underwent EPS by Dr. Rayann Heman on 05/11/17. He had Afib and atrial flutter ablations. When seen by Dr.  Rayann Heman on July 29 he had significant improvement in Afib symptoms and some improvement in dyspnea. Multaq was discontinued. Metoprolol was decreased. HR will go up with exercise to 110-130.   On 09/26/17 he had CPX to evaluate his dyspnea. This showed predominant limitation by obesity and deconditioning. He was seen by Dr. Halford Chessman who recommended repeat Echo and if pulmonary pressures still elevated to consider right heart cath. Of note right heart cath in 2016 demonstrated mild pulmonary HTN with elevated PCWP c/w diastolic dysfunction. Diuretic therapy was increased at that time without significant clinical improvement.  In June he was admitted to the hospital at [facility] with intestinal blockage. This was managed medically and with NG tube. This resolved.  He underwent THR in September 2020. Post op course complicated by blood loss anemia. He did develop Afib with rates into the 120s. Since then follow up in the Afib clinic demonstrated return of NSR. Last seen in AFib clinic on 11/24/19 and in NSR with PACs.   On follow up today he is now limited by back problems and hasn't been walking much. He is followed at pain clinic in [facility] and is considering having a spinal stimulator placed.  Recent infection of a lymph node and is on doxycycline. Not aware of any Afib. Has mild ankle edema. Notes periodic cramps in back and side of legs and hands.    Past Medical History:  Diagnosis Date  . Anticoagulant long-term use   . Aortic  root enlargement ([place])   . Ascending aortic aneurysm [facility])    recent scan in October 2012 showing no change; followed by Dr. Servando Snare  . ASCVD (arteriosclerotic cardiovascular disease)    Prior BMS to the 2nd OM in September 2012; with repeat cath in October showing patency  . CAD (coronary artery disease)    a. s/p BMS to 2nd OM in Sept 2012; b. LexiScan Myoview (12/2012):  Inf infarct; bowel and motion artifact make study difficult to interpret; no ischemia; not  gated; Low Risk  . CHF (congestive heart failure) ([place])    no recent issues 10/13/14  . Chronic back pain    "all over my back" (05/11/2017)  . Colonic polyp   . Contact lens/glasses fitting   . Diastolic dysfunction   . DVT (deep venous thrombosis) ([place])    ?LLE  . Frequent headaches    "probably weekly" (05/11/2017)  . Generalized headaches    neck stenosis  . GERD (gastroesophageal reflux disease)   . Hearing loss   . Hearing loss    more so on left  . Hemorrhoids   . History of stomach ulcers   . Hypertension   . IBS (irritable bowel syndrome)   . LVH (left ventricular hypertrophy)   . Mild intermittent asthma   . OA (osteoarthritis)    "all over" (05/11/2017)  . Obesities, morbid ([place])   . OSA (obstructive sleep apnea)    PSG 03/30/97 AHI 21, BPAP 13/9  . OSA on CPAP   . PAF (paroxysmal atrial fibrillation) (Carlos Dawson)    a. on Xarelto b. s/p DCCV in 08/2016; b. Tikosyn failed 04/16/17 with plans for Multaq and possible Afib ablation with Dr. Rayann Heman  . Pneumonia    'several times" (05/11/2017)  . Prostate [facility])    Oncologist  DR. Daralene Milch [facility] 09/24/14, undetermined tx   prostate; S/P "radiation and hormone injections"  . Pulmonary embolism ([place]) 2008   "both lungs"  . SOB (shortness of breath)    on excertion  . Thoracic aortic aneurysm ([facility])    Aortic Size Index=     5.0    /Body surface area is 2.43 meters squared. = 2.05  < 2.75 cm/m2      4% risk per year 2.75 to 4.25          8% risk per year > 4.25 cm/m2    20% risk per year   Stable aneurysmal dilation of the ascending aorta with maximum AP diameter of 4.8 cm. Stable area of narrowing of the proximal most portion of the descending aorta measuring 2 cm., previously identified as an area of coarctation. No evidence of aortic dissection.  Coronary artery disease.  Normal appearance of the lungs.   Electronically Signed   By: Fidela Salisbury M.D.   On: 10/01/2014 08:50    . Type II diabetes mellitus ([place])     metphormin, average 154 dx 2017    Past Surgical History:  Procedure Laterality Date  . ACHILLES TENDON REPAIR Bilateral   . AORTIC ARCH ANGIOGRAPHY N/A 03/13/2017   Procedure: AORTIC ARCH ANGIOGRAPHY;  Surgeon: Martin[place], Aniella Wandrey M, MD;  Location: [place] CV LAB;  Service: Cardiovascular;  Laterality: N/A;  . APPENDECTOMY    . ATRIAL FIBRILLATION ABLATION  05/11/2017  . ATRIAL FIBRILLATION ABLATION N/A 05/11/2017   Procedure: ATRIAL FIBRILLATION ABLATION;  Surgeon: [name], MD;  Location: [place] CV LAB;  Service: Cardiovascular;  Laterality: N/A;  . BACK SURGERY     "  I've had 7 back and 1 neck ORs" (05/11/2017)  . BIOPSY  03/14/2018   Procedure: BIOPSY;  Surgeon: Ronnette Juniper, MD;  Location: [facility] ENDOSCOPY;  Service: Gastroenterology;;  EGD and Colon  . CARDIAC CATHETERIZATION  2006  . CARPAL TUNNEL RELEASE Bilateral    LEFT  . CATARACT EXTRACTION W/ INTRAOCULAR LENS  IMPLANT, BILATERAL Bilateral   . CERVICAL SPINE SURGERY  06/02/2010   lower back and neck  . COLONOSCOPY N/A 03/14/2018   Procedure: COLONOSCOPY;  Surgeon: Ronnette Juniper, MD;  Location: [facility];  Service: Gastroenterology;  Laterality: N/A;  . COLONOSCOPY WITH PROPOFOL N/A 12/29/2014   Procedure: COLONOSCOPY WITH PROPOFOL;  Surgeon: Garlan Fair, MD;  Location: [facility] END[facility];  Service: Endoscopy;  Laterality: N/A;  . CORONARY ANGIOPLASTY WITH STENT PLACEMENT  October 2012  . CORONARY STENT PLACEMENT  Sept 2012   2nd OM with BMS  . ESOPHAGOGASTRODUODENOSCOPY N/A 03/14/2018   Procedure: ESOPHAGOGASTRODUODENOSCOPY (EGD);  Surgeon: Ronnette Juniper, MD;  Location: [facility];  Service: Gastroenterology;  Laterality: N/A;  . HEMORROIDECTOMY    . LAMINECTOMY  05/30/2012   L 4 L5  . LAMINECTOMY WITH POSTERIOR LATERAL ARTHRODESIS LEVEL 3 N/A 10/18/2016   Procedure: Posterior Lateral Fusion - Lumbar One-Four, segmental instrumentation Lumbar One-Five,  decompression,;  Surgeon: Eustace Moore, MD;  Location: [facility] OR;  Service:  Neurosurgery;  Laterality: N/A;  . LAPAROSCOPIC CHOLECYSTECTOMY    . LAPAROSCOPIC GASTRIC BANDING    . LEFT AND RIGHT HEART CATHETERIZATION WITH CORONARY ANGIOGRAM N/A 05/07/2014   Procedure: LEFT AND RIGHT HEART CATHETERIZATION WITH CORONARY ANGIOGRAM;  Surgeon: Harriett Azar M [place], MD;  Location: [facility] CATH LAB;  Service: Cardiovascular;  Laterality: N/A;  . LEFT HEART CATH AND CORONARY ANGIOGRAPHY N/A 03/13/2017   Procedure: LEFT HEART CATH AND CORONARY ANGIOGRAPHY;  Surgeon: Martin[place], Vega Withrow M, MD;  Location: [place] CV LAB;  Service: Cardiovascular;  Laterality: N/A;  . LUMBAR LAMINECTOMY/DECOMPRESSION MICRODISCECTOMY N/A 05/04/2016   Procedure: Laminectomy and Foraminotomy - Thoracic twelve-Lumbar one -Posterior Fusion Lumbar one-two;  Surgeon: Eustace Moore, MD;  Location: [facility];  Service: Neurosurgery;  Laterality: N/A;  . POLYPECTOMY  03/14/2018   Procedure: POLYPECTOMY;  Surgeon: Ronnette Juniper, MD;  Location: [facility]OS[facility];  Service: Gastroenterology;;  . POSTERIOR LUMBAR FUSION  10/18/2016  . SHOULDER OPEN ROTATOR CUFF REPAIR Bilateral   . TONSILLECTOMY AND ADENOIDECTOMY    . TOTAL HIP ARTHROPLASTY Right 10/18/2018   Procedure: RIGHT TOTAL HIP ARTHROPLASTY ANTERIOR APPROACH;  Surgeon: Mcarthur Rossetti, MD;  Location: [facility] ORS;  Service: Orthopedics;  Laterality: Right;  . TRIGGER FINGER RELEASE     LEFT  . UVULOPALATOPHARYNGOPLASTY    . VASECTOMY      Current Medications: Outpatient Medications Prior to Visit  Medication Sig Dispense Refill  . acetaminophen (TYLENOL) 500 MG tablet Take 1,000 mg by mouth every 8 (eight) hours as needed for mild pain or headache.     . albuterol (PROVENTIL HFA;VENTOLIN HFA) 108 (90 Base) MCG/ACT inhaler Inhale 2 puffs into the lungs every 4 (four) hours as needed for wheezing or shortness of breath.    Marland Kitchen atorvastatin (LIPITOR) 10 MG tablet Take 5 mg by mouth daily at 6 PM.     . augmented betamethasone dipropionate (DIPROLENE-AF) 0.05 % cream Apply 1  application topically 2 (two) times daily as needed for itching.  3  . benzonatate (TESSALON) 200 MG capsule Take 1 capsule (200 mg total) by mouth 3 (three) times daily as needed for cough. 30 capsule 1  .  Budeson-Glycopyrrol-Formoterol (BREZTRI AEROSPHERE) 160-9-4.8 MCG/ACT AERO Inhale 2 puffs into the lungs 2 (two) times daily. 10.7 g 0  . budesonide-formoterol (SYMBICORT) 80-4.5 MCG/ACT inhaler Inhale 2 puffs into the lungs 2 (two) times daily as needed (for shortness of breath).     . celecoxib (CELEBREX) 200 MG capsule Take 200 mg by mouth 2 (two) times daily as needed for moderate pain.  0  . Cholecalciferol (VITAMIN D-3) 5000 units TABS Take 5,000 Units by mouth daily.    . Coenzyme Q10 100 MG capsule Take 100 mg by mouth daily.     . furosemide (LASIX) 80 MG tablet Take 80 mg by mouth daily. Take 80 mg in the morning     . JARDIANCE 25 MG TABS tablet Take 1 tablet by mouth daily.    Marland Kitchen KLOR-CON M20 20 MEQ tablet TAKE 2 TABLETS BY MOUTH TWICE A DAY (Patient taking differently: Take 60 mEq by mouth daily.) 60 tablet 1  . losartan (COZAAR) 25 MG tablet TAKE 1 TABLET BY MOUTH EVERY DAY 90 tablet 3  . methocarbamol (ROBAXIN) 500 MG tablet Take 1 tablet (500 mg total) by mouth every 8 (eight) hours as needed for muscle spasms. 60 tablet 1  . metoprolol tartrate (LOPRESSOR) 25 MG tablet Take 12.5 mg by mouth 2 (two) times daily.    . montelukast (SINGULAIR) 10 MG tablet Take 10 mg by mouth at bedtime.    . nitroGLYCERIN (NITROSTAT) 0.4 MG SL tablet Place 1 tablet (0.4 mg total) under the tongue every 5 (five) minutes as needed for chest pain. 25 tablet 5  . pantoprazole (PROTONIX) 20 MG tablet Take 20 mg by mouth daily.    Vladimir Faster Glycol-Propyl Glycol (SYSTANE OP) Place 1 drop into both eyes daily as needed (for dry eyes).     . psyllium (METAMUCIL) 58.6 % packet Take 1 packet by mouth daily.    . rivaroxaban (XARELTO) 20 MG TABS tablet Take 1 tablet (20 mg total) by mouth daily with supper.  30 tablet   . sildenafil (VIAGRA) 100 MG tablet Take 100 mg by mouth as needed for erectile dysfunction.     . Skin Protectants, Misc. (EUCERIN) cream Apply 1 application topically as needed for dry skin.    Marland Kitchen spironolactone (ALDACTONE) 25 MG tablet Take 25 mg by mouth daily.    . tamsulosin (FLOMAX) 0.4 MG CAPS Take 0.4 mg by mouth every evening.     . topiramate (TOPAMAX) 25 MG capsule Take 50 mg by mouth 2 (two) times daily.      No facility-administered medications prior to visit.     Allergies:   Other, Ace inhibitors, Amoxicillin-pot clavulanate, Nifedipine, Quinolones, Adhesive [tape], Codeine, Latex, Metformin, and Morphine   Social History   Socioeconomic History  . Marital status: Married    Spouse name: Not on file  . Number of children: 3  . Years of education: Not on file  . Highest education level: Not on file  Occupational History  . Occupation: Retired from Scientist, clinical (histocompatibility and immunogenetics): RETIRED  Tobacco Use  . Smoking status: Former Smoker    Packs/day: 1.50    Years: 30.00    Pack years: 45.00    Types: Cigarettes    Start date: 17    Quit date: 01/24/1992    Years since quitting: 28.3  . Smokeless tobacco: Never Used  Vaping Use  . Vaping Use: Never used  Substance and Sexual Activity  . Alcohol use: No  . Drug use:  No  . Sexual activity: Not Currently  Other Topics Concern  . Not on file  Social History Narrative  . Not on file   Social Determinants of Health   Financial Resource Strain: Not on file  Food Insecurity: Not on file  Transportation Needs: Not on file  Physical Activity: Not on file  Stress: Not on file  Social Connections: Not on file     Family History:  The patient's family history includes Diabetes in his mother; Emphysema (age of onset: 8) in his father; Heart attack in his sister; Heart disease in his mother; Other in his mother.   Review of Systems:   Please see the history of present illness.    All other systems reviewed and are  otherwise negative except as noted above.   Physical Exam:    VS:  There were no vitals taken for this visit.   GENERAL:  Well appearing,  obese WM in NAD HEENT:  PERRL, EOMI, sclera are clear. Oropharynx is clear. NECK:  No jugular venous distention, carotid upstroke brisk and symmetric, no bruits, no thyromegaly or adenopathy LUNGS:  Clear to auscultation bilaterally CHEST:  Unremarkable HEART:  RRR,  PMI not displaced or sustained,S1 and S2 within normal limits, no S3, no S4: no clicks, no rubs, no murmurs ABD:  Soft, nontender. BS +, no masses or bruits. No hepatomegaly, no splenomegaly EXT:  2 + pulses throughout, 1+ edema, no cyanosis no clubbing SKIN:  Warm and dry.  No rashes NEURO:  Alert and oriented x 3. Cranial nerves II through XII intact. PSYCH:  Cognitively intact        Wt Readings from Last 3 Encounters:  04/15/20 240 lb 9.6 oz (109.1 kg)  03/31/20 245 lb (111.1 kg)  02/19/20 249 lb 12.8 oz (113.3 kg)      Studies/Labs Reviewed:   EKG:  EKG is not ordered today.   Recent Labs: 11/24/2019: BUN 25; Creatinine, Ser 1.11; Potassium 3.7; Sodium 138   Lipid Panel    Component Value Date/Time   CHOL 118 01/07/2013 0410   TRIG 133 01/07/2013 0410   HDL 40 01/07/2013 0410   CHOLHDL 3.0 01/07/2013 0410   VLDL 27 01/07/2013 0410   LDLCALC 51 01/07/2013 0410   Labs dated 07/11/16: cholesterol 114, triglycerides 80, HDL 46, LDL 52. A1c 7.1%.  Dated 01/24/17: cholesterol 110, triglycerides 50, HDL 52, LDL 48. A1c 7.8%.  Dated 07/16/17: cholesterol 106, triglycerides 72, HDL 50, LDL 41. Creatinine 1.36. Hgb 12.8. Other chemistries and TSH normal.  Dated 10/10/18: cholesterol 100, triglycerides 54, HDL 51, LDL 38.  Dated 10/28/19: cholesterol 97, triglycerides 35, HDL 53, LDL 38. Iron level 125. A1c 6.7 %. TSH normal.  Dated 11/26/19: creatinine 1.19. glucose 122. Hgb 12.0. plts 125K. Other chemistries normal.   Additional studies/ records that were reviewed today  include:   Myoview 03/06/17: Study Highlights    The left ventricular ejection fraction is normal (55-65%).  Nuclear stress EF: 61%.  There is a small defect of mild severity present in the basal inferior, mid inferior and apex location. The defect is partially reversible. Overall image quality is poor due to soft tissue attenuation. Cannot rule out a small area of ischemia.  There is evidence of transient ischemic dilatation with a TID of 1.27.  This is an intermediate risk study.  There was no ST segment deviation noted during stress    Echo 03/07/17: Study Conclusions  - Left ventricle: The cavity size was normal. Wall  thickness was   increased in a pattern of mild LVH. Systolic function was normal.   The estimated ejection fraction was in the range of 55% to 60%.   Wall motion was normal; there were no regional wall motion   abnormalities. Left ventricular diastolic function parameters   were normal. - Aortic valve: There was very mild stenosis. Valve area (VTI):   1.81 cm^2. Valve area (Vmax): 1.94 cm^2. Valve area (Vmean): 1.85   cm^2. - Right atrium: The atrium was mildly dilated. - Atrial septum: No defect or patent foramen ovale was identified. - Pulmonary arteries: PA peak pressure: 54 mm Hg (S).  Cardiac cath 03/15/17: Conclusion     Previously placed Ost 2nd Mrg to 2nd Mrg stent (unknown type) is widely patent.  There is no aortic valve regurgitation.   1. No significant obstructive CAD 2. Aneurysmal thoracic aorta.   Plan: continue medical management . Consider pulmonary evaluation for symptoms of dyspnea.      Referred for: Dyspnea on exertion   Procedure: This patient underwent staged symptom-limited exercise testing using an individualized bike protocol with expired gas analysis metabolic evaluation during exercise.  Demographics  Age: 61 Ht. (in.) 67.0 Wt. (lb) 272.4 BMI: 42.7   Predicted Peak VO2: 16.0  Gender: Male Ht (cm) 170.2 Wt. (kg)  123.6    Results  Pre-Exercise PFTs   FVC 2.01 (54%)     FEV1 1.21 (43%)      FEV1/FVC 60      MVV 68 (60%)      Exercise Time:  7:00   Watts: 60  RPE: 15  Reason stopped: patient ended test due to dyspnea (9/10)  Additional symptoms: chest tightness (6/10) and lightheaded (5/10)  Resting HR: 55 Peak HR: 107  (49% age predicted max HR)  BP rest: 122/70 BP peak: 170/82  Peak VO2: 11.4 (71% predicted peak VO2)  VE/VCO2 slope: 40  OUES: 1.82  Peak RER: 0.96  Ventilatory Threshold: 8.7 (54% predicted and 76% measured peak VO2)  Peak RR 36  Peak Ventilation: 52.2  VE/MVV: 77%  PETCO2 at peak: 30  O2pulse: 13  (93% predicted O2pulse)   Interpretation  Notes: Patient gave a good effort although he was dyspneic. Pulse-oximetry at rest 97% with a low of 93% at peak exercise. Exercise was performed on a cycle ergometer starting at [facility] and increasing by 10W/min.   ECG:  Resting ECG in sinus bradycardia with 1st degree AV block and incomplete right bundle branch block. HR response blunted. There were frequent PVCs at rest with suppression during exercise with no sustained arrhythmias and no ST-T changes. BP response appropriate.   PFT: Pre-exercise spirometry demonstrates severe obstruction. MVV below normal range.   CPX: Exercise Capacity- Exercise testing with gas exchange demonstrates a mildly reduced peak VO2 of 11.4 ml/kg/min (71% of the age/gender/weight matched sedentary norms). The RER of 0.96 indicates a submaximal effort. When adjusted to the patient's ideal body weight of 162.6 lb (73.7 kg) the peak VO2 is 16.0 ml/kg (ibw)/min (70% of the ibw-adjusted predicted).   Cardiovascular response- The O2pulse (a surrogate for strokevolume) increased with incremental exercise reaching peak of 13 ml/beat (93% predicted). DeltaVO2/Delta WR is 8.4 Indicating no clear evidence of cardiovascular impairment.  Ventilatory response- The VE/VCO2  slope is elevated and indicates Increased dead space ventilation. The oxygen uptake efficiency slope (OUES) is 1.82. The VO2 at the ventilatory threshold was normal at 54% of the predicted peak VO2 and 76% measured PVO2. At peak exercise,  the ventilation reached 54% of the measured MVV and breathing reserve was 16 indicating ventilatory reserve was nearly depleted. PETCO2 was normal at 81mmHg during exercise.   Conclusion: The interpretation of this test is limited due to submaximal effort during the exercise. Based on available data, exercise testing with gas exchange demonstrates mild functional impairment when compared to matched sedentary norms. Suspect dyspnea is multifactorial, there is evidence of effect of deconditioning and primary limitation due to body habitus.   In addition he had inadequate heart rate response to exercise and a state of hyperventilation at peak exercise, which induced an elevated VE/VCO2 slope. He approached depletion of ventilatory reserve which may be due to his obstructive lung disease.  Test, report and preliminary impression by: Landis Martins, MS, ACSM-RCEP 09/26/2017 3:03 PM  FINALIZED Marshell Garfinkel MD [facility] 10/08/2017, 10:04 AM  Assessment:    No diagnosis found.   Plan:   In order of problems listed above:  1. Paroxysmal Atrial Fibrillation/ Long-term Anticoagulation - s/p DCCV in August 2018.  - s/p Afib and Aflutter ablation in April 2019. Now off antiarrhythmic therapy. - some breakthrough after prior surgery a year ago but doing well now.  - This patients CHA2DS2-VASc Score and unadjusted Ischemic Stroke Rate (% per year) is equal to 9.7 % stroke rate/year from a score of 6 (HTN, DM, Vascular, Age, TE (2)). He denies any evidence of active bleeding. Continue Xarelto for anticoagulation. He will let us know if he plans to have nerve stimulator placed so we can inform when he should hold Xarelto.   2. CAD - s/p BMS  to 2nd OM in 2012, cardiac cath Feb 2019 showed continued patency with nonobstructive disease. - asymptomatic.  - Continue  statin therapy. No ASA secondary to need for Xarelto.  3. Chronic diastolic CHF - echo in [id number] showed a preserved EF of 55-60%. He has no significant increase in  edema on examination and weight is down.  -. Note right heart findings in 2016.  - Now on Jardiance  - edema stable since lasix reduced to once a day.  4. HTN - BP is well-controlled  - continue  Current therapy  5. HLD -  LDL 38.  - continue Atorvastatin 10mg  daily.   6. Thoracic aortic aneurysm  - stable at 5.0 cm by last CT Scan. - followed by Dr. Servando Snare with CT Surgery.   7. Chronic respiratory failure due to morbid obesity and OSA. Followed by pulmonary. - recent CPX suggests major limitation is deconditioning and body habitus. Improved with weight loss.   8. Pulmonary HTN secondary to #3 and #7.  Cardiac cath in 2016 showed mild pulmonary venous HTN. Last Echo in Sept 2019 showed normal pulmonary pressure.       Signed, Raun Routh [place], [place] 05/28/2020 7:30 AM    [facility] [address], [place], [place] [address] Phone: [phone]

## 2020-05-28 NOTE — Progress Notes (Signed)
[facility]  Chief Complaint  Patient presents with  . Shortness of Breath    6 week follow up, feels somewhat better, could not tell a difference in the use of symbicort vs breztri    Constitutional:  BP 112/62   Pulse (!) 52   Ht 5\' 6"  (1.676 m)   Wt 249 lb (112.9 kg)   SpO2 97%   BMI 40.19 kg/m   Past Medical History:  Ascending aorta dilation, CAD s/p stent, Diastolic CHF, HTN,  A fib, Colon polyps, DVT, PE, Headaches, GERD, PUD, IBS, OA, Pneumonia, Prostate cancer, DM type 2  Past Surgical History:  His  has a past surgical history that includes Hemorroidectomy; Vasectomy; Cervical spine surgery (06/02/2010); Achilles tendon repair (Bilateral); Coronary stent placement (Sept 2012); Appendectomy; Laminectomy (05/30/2012); left and right heart catheterization with coronary angiogram (N/A, 05/07/2014); Laparoscopic gastric banding; Uvulopalatopharyngoplasty; Colonoscopy with propofol (N/A, 12/29/2014); Carpal tunnel release (Bilateral); Trigger finger release; Lumbar laminectomy/decompression microdiscectomy (N/A, 05/04/2016); Posterior lumbar fusion (10/18/2016); Laminectomy with posterior lateral arthrodesis level 3 (N/A, 10/18/2016); LEFT HEART CATH AND CORONARY ANGIOGRAPHY (N/A, 03/13/2017); AORTIC ARCH ANGIOGRAPHY (N/A, 03/13/2017); ATRIAL FIBRILLATION ABLATION (05/11/2017); Laparoscopic cholecystectomy; Shoulder open rotator cuff repair (Bilateral); Back surgery; Cataract extraction w/ intraocular lens  implant, bilateral (Bilateral); Tonsillectomy and adenoidectomy; Cardiac catheterization (2006); Coronary angioplasty with stent (October 2012); ATRIAL FIBRILLATION ABLATION (N/A, 05/11/2017); Esophagogastroduodenoscopy (N/A, 03/14/2018); Colonoscopy (N/A, 03/14/2018); biopsy (03/14/2018); polypectomy (03/14/2018); and Total hip arthroplasty (Right, 10/18/2018).  Brief Summary:  Carlos Dawson is a 80 y.o. male former smoker with dyspnea, OSA, and  asthma.      Subjective:   Chest xray from 04/17/20 was unremarkable.  Recovered from recent exacerbation.  No side effects from breztri.  He feels most of his limitation to activity now relates to back and hip pains.  When he walks his legs then get fatigued quickly and feel heavy.  He then gets short of breath.    He isn't having cough, wheeze, or sputum.  Allergies a little worse this time of year.    Uses CPAP nightly.  Physical Exam:   Appearance - well kempt   ENMT - no sinus tenderness, no oral exudate, no LAN, Mallampati 3 airway, no stridor  Respiratory - equal breath sounds bilaterally, no wheezing or rales  CV - s1s2 regular rate and rhythm, no murmurs  Ext - no clubbing, no edema  Skin - no rashes  Psych - normal mood and affect     Pulmonary testing:   PFT 12/20/10 >> FEV1 2.13(79%), FEV1% 71, TLC 5.80(100%), DLCO 57%, no BD  Spirometry 05/11/15 >> FEV1 1.68 (55%), FEV1% 69  FeNO 08/13/15 >> 13  Room air SpO2 with exertion 08/13/15 >> 87% - improved with 2 liters oxygen  Chest Imaging:   CT chest 09/14/11 >> Stable dilatation of ascending thoracic aorta 4.7 cm, PA 4.1 cm  CT angio chest 10/01/14 >> aortic dilation  CT angio chest 09/15/16 >> 5 cm ascending TAA  CT angio chest 04/24/19 >> ascending aorta 4.8 x 4.8 cm  Sleep Tests:   PSG 03/30/97 >> AHI 21  PSG 10/21/13 >> AHI 40.1, SaO2 80%, PLMI 75.6  CPAP 12/28/13 >>CPAP 10 cm H2O >>AHI 1.5, +R   ONO with CPAP 08/19/15 >>test time 8 hrs 40 min. Basal SpO2 92.3%, low SpO2 86%. Spent 1.3 min with SpO2 <88%.  CPAP 03/15/20 to 04/13/20 >> used on 30 of 30 nights with average 8 hrs 15 min.  Average AHI 6 with CPAP 10 cm H2O  Cardiac Tests:   [facility] 03/13/17 >> patent Ost 2nd Mrg stent  Cardiopulmonary exercise test 09/26/17 >> deconditioning  Echo 10/17/17 >> EF 60 to 65%, grade 2 DD  Social History:  He  reports that he quit smoking about 28 years ago. His smoking use included cigarettes. He  started smoking about 64 years ago. He has a 45.00 pack-year smoking history. He has never used smokeless tobacco. He reports that he does not drink alcohol and does not use drugs.  Family History:  His family history includes Diabetes in his mother; Emphysema (age of onset: 41) in his father; Heart attack in his sister; Heart disease in his mother; Other in his mother.     Assessment/Plan:   COPD with asthma. - continue breztri; he will check if this is available from the [place] - continue singulair - prn albuterol  Obstructive sleep apnea. - he is compliant with CPAP and reports benefit from therapy - uses [employer] for his DME - continue CPAP 10 cm H2O - he is still waiting for replacement CPAP  Allergic rhinitis with upper airway cough syndrome. - continue singulair  Dyspnea on exertion. - likely related to deconditioning  - discussed exercises he could try that shouldn't put too much stress on his back and hips  A fib s/p ablation, chronic diastolic CHF. - followed by Dr. Peter Martinique with [facility]  Ascending thoracic aortic aneurysm. - he will be changing to Dr. Cyndia Bent for follow up  Time Spent Involved in Patient Care on Day of Examination:  36 minutes  Follow up:  Patient Instructions  Follow up in 4 months   Medication List:   Allergies as of 05/28/2020      Reactions   Other    Other reaction(s): cough, Other Other reaction(s): cough Other reaction(s): wt gain   Ace Inhibitors Cough   Amoxicillin-pot Clavulanate    Other reaction(s): GI Upset (intolerance)   Nifedipine    Other reaction(s): leg edema   Quinolones    Patient was warned about not using Cipro and similar antibiotics. Recent studies have raised concern that fluoroquinolone antibiotics could be associated with an increased risk of aortic aneurysm Fluoroquinolones have non-antimicrobial properties that might jeopardise the integrity of the extracellular matrix of the vascular wall In a   propensity score matched cohort study in [place], there was a 66% increased rate of aortic aneurysm or dissection associated with oral fluoroquinolone use, compared wit   Adhesive [tape] Itching, Rash   Codeine Nausea Only   Other reaction(s): nausea Other reaction(s): nausea Other reaction(s): nausea   Latex Itching, Rash, Other (See Comments)   Bandaids   Metformin Diarrhea   Morphine Itching      Medication List       Accurate as of May 28, 2020 12:50 PM. If you have any questions, ask your nurse or doctor.        STOP taking these medications   budesonide-formoterol 80-4.5 MCG/ACT inhaler Commonly known as: SYMBICORT Stopped by: Chesley Mires, MD     TAKE these medications   acetaminophen 500 MG tablet Commonly known as: TYLENOL Take 1,000 mg by mouth every 8 (eight) hours as needed for mild pain or headache.   albuterol 108 (90 Base) MCG/ACT inhaler Commonly known as: VENTOLIN HFA Inhale 2 puffs into the lungs every 4 (four) hours as needed for wheezing or shortness of breath.   atorvastatin 10 MG tablet Commonly known as:  LIPITOR Take 5 mg by mouth daily at 6 PM.   augmented betamethasone dipropionate 0.05 % cream Commonly known as: DIPROLENE-AF Apply 1 application topically 2 (two) times daily as needed for itching.   benzonatate 200 MG capsule Commonly known as: TESSALON Take 1 capsule (200 mg total) by mouth 3 (three) times daily as needed for cough.   Breztri Aerosphere 160-9-4.8 MCG/ACT Aero Generic drug: Budeson-Glycopyrrol-Formoterol Inhale 2 puffs into the lungs 2 (two) times daily.   celecoxib 200 MG capsule Commonly known as: CELEBREX Take 200 mg by mouth 2 (two) times daily as needed for moderate pain.   Coenzyme Q10 100 MG capsule Take 100 mg by mouth daily.   eucerin cream Apply 1 application topically as needed for dry skin.   furosemide 80 MG tablet Commonly known as: LASIX Take 80 mg by mouth daily. Take 80 mg in the morning    Jardiance 25 MG Tabs tablet Generic drug: empagliflozin Take 1 tablet by mouth daily.   Klor-Con M20 20 MEQ tablet Generic drug: potassium chloride SA TAKE 2 TABLETS BY MOUTH TWICE A DAY What changed:   how much to take  when to take this   losartan 25 MG tablet Commonly known as: COZAAR TAKE 1 TABLET BY MOUTH EVERY DAY   methocarbamol 500 MG tablet Commonly known as: ROBAXIN Take 1 tablet (500 mg total) by mouth every 8 (eight) hours as needed for muscle spasms.   metoprolol tartrate 25 MG tablet Commonly known as: LOPRESSOR Take 12.5 mg by mouth 2 (two) times daily.   montelukast 10 MG tablet Commonly known as: SINGULAIR Take 10 mg by mouth at bedtime.   nitroGLYCERIN 0.4 MG SL tablet Commonly known as: Nitrostat Place 1 tablet (0.4 mg total) under the tongue every 5 (five) minutes as needed for chest pain.   pantoprazole 20 MG tablet Commonly known as: PROTONIX Take 20 mg by mouth daily.   psyllium 58.6 % packet Commonly known as: METAMUCIL Take 1 packet by mouth daily.   rivaroxaban 20 MG Tabs tablet Commonly known as: XARELTO Take 1 tablet (20 mg total) by mouth daily with supper.   sildenafil 100 MG tablet Commonly known as: VIAGRA Take 100 mg by mouth as needed for erectile dysfunction.   spironolactone 25 MG tablet Commonly known as: ALDACTONE Take 25 mg by mouth daily.   SYSTANE OP Place 1 drop into both eyes daily as needed (for dry eyes).   tamsulosin 0.4 MG Caps capsule Commonly known as: FLOMAX Take 0.4 mg by mouth every evening.   topiramate 25 MG capsule Commonly known as: TOPAMAX Take 50 mg by mouth 2 (two) times daily.   Vitamin D-3 125 MCG (5000 UT) Tabs Take 5,000 Units by mouth daily.       Signature:  Chesley Mires, MD [place] Pager - [phone] 05/28/2020, 12:50 PM

## 2020-06-02 ENCOUNTER — Ambulatory Visit: Payer: Medicare Other | Admitting: Cardiology

## 2020-06-03 DIAGNOSIS — I480 Paroxysmal atrial fibrillation: Secondary | ICD-10-CM | POA: Diagnosis not present

## 2020-06-03 DIAGNOSIS — J45909 Unspecified asthma, uncomplicated: Secondary | ICD-10-CM | POA: Diagnosis not present

## 2020-06-03 DIAGNOSIS — K219 Gastro-esophageal reflux disease without esophagitis: Secondary | ICD-10-CM | POA: Diagnosis not present

## 2020-06-03 DIAGNOSIS — I2510 Atherosclerotic heart disease of native coronary artery without angina pectoris: Secondary | ICD-10-CM | POA: Diagnosis not present

## 2020-06-03 DIAGNOSIS — N400 Enlarged prostate without lower urinary tract symptoms: Secondary | ICD-10-CM | POA: Diagnosis not present

## 2020-06-03 DIAGNOSIS — G4700 Insomnia, unspecified: Secondary | ICD-10-CM | POA: Diagnosis not present

## 2020-06-03 DIAGNOSIS — I509 Heart failure, unspecified: Secondary | ICD-10-CM | POA: Diagnosis not present

## 2020-06-03 DIAGNOSIS — E119 Type 2 diabetes mellitus without complications: Secondary | ICD-10-CM | POA: Diagnosis not present

## 2020-06-03 DIAGNOSIS — N183 Chronic kidney disease, stage 3 unspecified: Secondary | ICD-10-CM | POA: Diagnosis not present

## 2020-06-03 DIAGNOSIS — J4530 Mild persistent asthma, uncomplicated: Secondary | ICD-10-CM | POA: Diagnosis not present

## 2020-06-03 DIAGNOSIS — I10 Essential (primary) hypertension: Secondary | ICD-10-CM | POA: Diagnosis not present

## 2020-06-03 DIAGNOSIS — E785 Hyperlipidemia, unspecified: Secondary | ICD-10-CM | POA: Diagnosis not present

## 2020-06-15 ENCOUNTER — Other Ambulatory Visit: Payer: Self-pay | Admitting: Orthopedic Surgery

## 2020-06-15 DIAGNOSIS — M25511 Pain in right shoulder: Secondary | ICD-10-CM | POA: Diagnosis not present

## 2020-06-16 DIAGNOSIS — M533 Sacrococcygeal disorders, not elsewhere classified: Secondary | ICD-10-CM | POA: Diagnosis not present

## 2020-06-16 DIAGNOSIS — M7918 Myalgia, other site: Secondary | ICD-10-CM | POA: Diagnosis not present

## 2020-06-16 DIAGNOSIS — M5416 Radiculopathy, lumbar region: Secondary | ICD-10-CM | POA: Diagnosis not present

## 2020-06-16 DIAGNOSIS — M48062 Spinal stenosis, lumbar region with neurogenic claudication: Secondary | ICD-10-CM | POA: Diagnosis not present

## 2020-06-17 ENCOUNTER — Telehealth: Payer: Self-pay

## 2020-06-17 NOTE — Telephone Encounter (Signed)
   Patient Name: Carlos Dawson  DOB: [birth year]  MRN: [id number]   Primary Cardiologist: Peter Martinique, MD  Chart reviewed as part of pre-operative protocol coverage. 80 year old male with history of CAD (s/p BMS to 2nd OM in 2012, low-risk NST in 12/2012), PAF (on Xarelto), chronic diastolic CHF, prior PE, HTN, HLD, Type 2 DM, and known thoracic aortic aneurysm planning to undergo spinal injection with request to hold Xarelto. Will route to pharmacy for recommendations regarding DOAC.    Christell Faith, PA-C 06/17/2020, 11:09 AM

## 2020-06-17 NOTE — Telephone Encounter (Signed)
Patient with diagnosis of atrial fibrillatin on Xarelto for anticoagulation.    Procedure: spinal injection Date of procedure: TBD   CHA2DS2-VASc Score = 6  This indicates a 9.7% annual risk of stroke. The patient's score is based upon: CHF History: Yes HTN History: Yes Diabetes History: Yes Stroke History: No Vascular Disease History: Yes Age Score: 2 Gender Score: 0  CrCl 87.4 (49.5 with IBW) Platelet count 125  Chart notes history of bilateral pulmonary embolism in 2008.  Nothing in chart to indicate whether provoked or not, but nothing since that time.    Per office protocol, patient can hold Xarelto for 3 days prior to procedure.   Patient will not need bridging with Lovenox (enoxaparin) around procedure.

## 2020-06-17 NOTE — Telephone Encounter (Signed)
   Patient Name: Carlos Dawson  DOB: April 10, 1940  MRN: [id number]   Primary Cardiologist: Peter Martinique, MD  Chart reviewed as part of pre-operative protocol coverage.   80 year old male with history ofCAD (s/p BMS to 2nd OM in 2012, low-risk NST in 12/2012), PAF (on Xarelto), chronic diastolic CHF, prior PE, HTN, HLD, Type 2 DM, and known thoracic aortic aneurysmplanning to undergo spinal injection with request to hold Xarelto.   Per pharmacy:   Procedure:spinal injection Date of procedure:TBD   CHA2DS2-VAScScore = 6 This indicates a9.7% annual risk of stroke. The patient's score is based upon: CHF History: Yes HTN History: Yes Diabetes History: Yes Stroke History: No Vascular Disease History: Yes Age Score: 2 Gender Score: 0  CrCl87.4 (49.5 with IBW) Platelet count125  Chart notes history of bilateral pulmonary embolism in 2008. Nothing in chart to indicate whether provoked or not, but nothing since that time.   He is doing well from a cardiac perspective without anginal or decompensation symptoms.   Per office protocol, patient can holdXareltofor 3days prior to procedure.  Patient will not need bridging with Lovenox (enoxaparin) around procedure. Resumption of Xarelto per spinal injection provider.    Christell Faith, PA-C 06/17/2020, 3:06 PM

## 2020-06-17 NOTE — Telephone Encounter (Signed)
   Patient Name: Carlos Dawson  DOB: [birth year]  MRN: [id number]   Primary Cardiologist: Peter Martinique, MD  Chart reviewed as part of pre-operative protocol coverage. 80 year old male with history of CAD (s/p BMS to 2nd OM in 2012, low-risk NST in 12/2012), PAF (on Xarelto), chronic diastolic CHF, prior PE, HTN, HLD, Type 2 DM, and known thoracic aortic aneurysm planning to undergo spinal injection with request to hold Xarelto.   Per pharmacy:    Procedure: spinal injection Date of procedure: TBD   CHA2DS2-VASc Score = 6  This indicates a 9.7% annual risk of stroke. The patient's score is based upon: CHF History: Yes HTN History: Yes Diabetes History: Yes Stroke History: No Vascular Disease History: Yes Age Score: 2 Gender Score: 0  CrCl 87.4 (49.5 with IBW) Platelet count 125  Chart notes history of bilateral pulmonary embolism in 2008.  Nothing in chart to indicate whether provoked or not, but nothing since that time.    Per office protocol, patient can hold Xarelto for 3 days prior to procedure.   Patient will not need bridging with Lovenox (enoxaparin) around procedure. Resumption of Xarelto per spinal injection provider.   Left voicemail for the patient to callback to discuss the above.      Christell Faith, PA-C 06/17/2020, 2:30 PM

## 2020-06-17 NOTE — Telephone Encounter (Signed)
   [facility] Pre-operative Risk Assessment    Patient Name: Carlos Dawson  DOB: [birth year]  MRN: [id number]   Request for surgical clearance:  1. What type of surgery is being performed Spinal injection  2. When is this surgery scheduled TBD  3. What type of clearance is required Pharmacy  4. Are there any medications that need to be held prior to surgery and how long Xarelto hold 3 days prior.  5. Practice name and name of physician performing surgery [facility]  Dr.Dave Eichman  6. What is the office phone number  [phone]   7.   What is the office fax number [phone]  8.   Anesthesia type not listed   Kathyrn Lass 06/17/2020, 9:26 AM  _________________________________________________________________   (provider comments below)

## 2020-06-22 DIAGNOSIS — H02101 Unspecified ectropion of right upper eyelid: Secondary | ICD-10-CM | POA: Diagnosis not present

## 2020-06-23 ENCOUNTER — Ambulatory Visit
Admission: RE | Admit: 2020-06-23 | Discharge: 2020-06-23 | Disposition: A | Discharge status: Home / Self Care | Payer: Medicare Other | Source: Ambulatory Visit | Attending: Orthopedic Surgery | Admitting: Orthopedic Surgery

## 2020-06-23 ENCOUNTER — Other Ambulatory Visit: Payer: Self-pay

## 2020-06-23 DIAGNOSIS — M25511 Pain in right shoulder: Secondary | ICD-10-CM

## 2020-06-26 ENCOUNTER — Other Ambulatory Visit: Payer: Medicare Other

## 2020-06-29 DIAGNOSIS — M25511 Pain in right shoulder: Secondary | ICD-10-CM | POA: Diagnosis not present

## 2020-06-29 NOTE — Progress Notes (Addendum)
Cardiology Office Note    Date:  07/13/2020   ID:  Carlos Dawson, DOB [birth year], MRN [id number]  PCP:  Josetta Huddle, MD  Cardiologist: Dr. Toriann Spadoni [place]   Chief Complaint  Patient presents with   Atrial Fibrillation    History of Present Illness:    Carlos Dawson is a 80 y.o. male with past medical history of CAD (s/p BMS to 2nd OM in 2012, low-risk NST in 12/2012), Cath in 2019 with nonobstructive disease, PAF (on Xarelto), chronic diastolic CHF, prior PE, HTN, HLD, Type 2 DM, and known thoracic aortic aneurysm who is seen for follow up.  He was admitted from 8/23 - 09/15/2016 for evaluation of chest discomfort and palpitations. His initial EKG showed that he was in atrial fibrillation with RVR. He reported good compliance with his Xarelto and denied missing any doses, therefore a successful DCCV was performed. He became hypotensive with SBP in the 80's following the procedure, therefore he was admitted overnight for further observation. His Losartan, Spironolactone, and Lasix were held at the time of discharge with plans to resume at follow-up if BP allowed. A CTA was also obtained during admission to rule out a recurrent PE and was negative for a PE but did show a stable 5.0 cm ascending thoracic aortic aneurysm. Antihypertensive therapy was resumed.   On follow up in February 2019   he noted increased symptoms of dyspnea on exertion and chest pressure for 3 months. Some improvement with inhalers.  No increase in edema or weight.  Oxygen levels were good. He does use CPAP. Myoview study was mildly abnormal with inferoapical reversible defect. EF 61%. Echo showed normal EF with moderate pulmonary HTN. Subsequent cardiac cath showed no obstructive CAD and no AI. Unable to cross AV due to distortion of the aortic root.  In April 2019 he developed increased symptoms of arrhythmia despite Tikosyn. He underwent EPS by Dr. Rayann Heman on 05/11/17. He had Afib and atrial flutter ablations. When  seen in follow up  he had significant improvement in Afib symptoms and some improvement in dyspnea. Multaq was discontinued.   On 09/26/17 he had CPX to evaluate his dyspnea. This showed predominant limitation by obesity and deconditioning. He was seen by Dr. Halford Chessman who recommended repeat Echo and if pulmonary pressures still elevated to consider right heart cath. Of note right heart cath in 2016 demonstrated mild pulmonary HTN with elevated PCWP c/w diastolic dysfunction. Diuretic therapy was increased at that time without significant clinical improvement.  He underwent THR in September 2020. Post op course complicated by blood loss anemia. He did develop Afib with rates into the 120s. Since then follow up in the Afib clinic demonstrated return of NSR. Last seen in AFib clinic on 11/24/19 and in NSR with PACs.   On follow up today he is doing OK. Still moving. Eating out more. Increased sodium intake. Notes more swelling and SOB. Weight up about 10 lbs. Now only taking lasix 40 mg daily. Seen by Dr Rayann Heman one week ago and doing well from a rhythm standpoint.   He is planning to have shoulder surgery this year.    Past Medical History:  Diagnosis Date   Anticoagulant long-term use    Aortic root enlargement (HCC)    Ascending aortic aneurysm [facility])    recent scan in October 2012 showing no change; followed by Dr. Servando Snare   ASCVD (arteriosclerotic cardiovascular disease)    Prior BMS to the 2nd OM in September 2012; with repeat  cath in October showing patency   CAD (coronary artery disease)    a. s/p BMS to 2nd OM in Sept 2012; b. LexiScan Myoview (12/2012):  Inf infarct; bowel and motion artifact make study difficult to interpret; no ischemia; not gated; Low Risk   CHF (congestive heart failure) (HCC)    no recent issues 10/13/14   Chronic back pain    "all over my back" (05/11/2017)   Colonic polyp    Contact lens/glasses fitting    Diastolic dysfunction    DVT (deep venous thrombosis) ([place])     ?LLE   Frequent headaches    "probably weekly" (05/11/2017)   Generalized headaches    neck stenosis   GERD (gastroesophageal reflux disease)    Hearing loss    Hearing loss    more so on left   Hemorrhoids    History of stomach ulcers    Hypertension    IBS (irritable bowel syndrome)    LVH (left ventricular hypertrophy)    Mild intermittent asthma    OA (osteoarthritis)    "all over" (05/11/2017)   Obesities, morbid (HCC)    OSA (obstructive sleep apnea)    PSG 03/30/97 AHI 21, BPAP 13/9   OSA on CPAP    PAF (paroxysmal atrial fibrillation) ([place])    a. on Xarelto b. s/p DCCV in 08/2016; b. Tikosyn failed 04/16/17 with plans for Multaq and possible Afib ablation with Dr. Rayann Heman   Pneumonia    'several times" (05/11/2017)   Prostate CA [facility])    Oncologist  DR. Daralene Milch [facility] dx 09/24/14, undetermined tx   prostate; S/P "radiation and hormone injections"   Pulmonary embolism ([place]) 2008   "both lungs"   SOB (shortness of breath)    on excertion   Thoracic aortic aneurysm (HCC)    Aortic Size Index=     5.0    /Body surface area is 2.43 meters squared. = 2.05  < 2.75 cm/m2      4% risk per year 2.75 to 4.25          8% risk per year > 4.25 cm/m2    20% risk per year   Stable aneurysmal dilation of the ascending aorta with maximum AP diameter of 4.8 cm. Stable area of narrowing of the proximal most portion of the descending aorta measuring 2 cm., previously identified as an area of coarctation. No evidence of aortic dissection.  Coronary artery disease.  Normal appearance of the lungs.   Electronically Signed   By: Fidela Salisbury M.D.   On: 10/01/2014 08:50     Type II diabetes mellitus ([place])    metphormin, average 154 dx 2017    Past Surgical History:  Procedure Laterality Date   ACHILLES TENDON REPAIR Bilateral    AORTIC ARCH ANGIOGRAPHY N/A 03/13/2017   Procedure: AORTIC ARCH ANGIOGRAPHY;  Surgeon: [place], Caydin Yeatts M, MD;  Location: [place] CV LAB;  Service:  Cardiovascular;  Laterality: N/A;   APPENDECTOMY     ATRIAL FIBRILLATION ABLATION  05/11/2017   ATRIAL FIBRILLATION ABLATION N/A 05/11/2017   Procedure: ATRIAL FIBRILLATION ABLATION;  Surgeon: [name], MD;  Location: [place] CV LAB;  Service: Cardiovascular;  Laterality: N/A;   BACK SURGERY     "I've had 7 back and 1 neck ORs" (05/11/2017)   BIOPSY  03/14/2018   Procedure: BIOPSY;  Surgeon: Ronnette Juniper, MD;  Location: [facility];  Service: Gastroenterology;;  EGD and Colon   CARDIAC CATHETERIZATION  2006   CARPAL TUNNEL  RELEASE Bilateral    LEFT   CATARACT EXTRACTION W/ INTRAOCULAR LENS  IMPLANT, BILATERAL Bilateral    CERVICAL SPINE SURGERY  06/02/2010   lower back and neck   COLONOSCOPY N/A 03/14/2018   Procedure: COLONOSCOPY;  Surgeon: Ronnette Juniper, MD;  [facility]: [facility];  Service: Gastroenterology;  Laterality: N/A;   COLONOSCOPY WITH PROPOFOL N/A 12/29/2014   Procedure: COLONOSCOPY WITH PROPOFOL;  Surgeon: Garlan Fair, MD;  [facility];  Service: Endoscopy;  Laterality: N/A;   CORONARY ANGIOPLASTY WITH STENT PLACEMENT  October 2012   CORONARY STENT PLACEMENT  Sept 2012   2nd OM with BMS   ESOPHAGOGASTRODUODENOSCOPY N/A 03/14/2018   Procedure: ESOPHAGOGASTRODUODENOSCOPY (EGD);  Surgeon: Ronnette Juniper, MD;  Location: [facility];  Service: Gastroenterology;  Laterality: N/A;   HEMORROIDECTOMY     LAMINECTOMY  05/30/2012   L 4 L5   LAMINECTOMY WITH POSTERIOR LATERAL ARTHRODESIS LEVEL 3 N/A 10/18/2016   Procedure: Posterior Lateral Fusion - Lumbar One-Four, segmental instrumentation Lumbar One-Five,  decompression,;  Surgeon: Eustace Moore, MD;  Location: [place];  Service: Neurosurgery;  Laterality: N/A;   LAPAROSCOPIC CHOLECYSTECTOMY     LAPAROSCOPIC GASTRIC BANDING     LEFT AND RIGHT HEART CATHETERIZATION WITH CORONARY ANGIOGRAM N/A 05/07/2014   Procedure: LEFT AND RIGHT HEART CATHETERIZATION WITH CORONARY ANGIOGRAM;  Surgeon: Tomothy Eddins M [place], MD;  Location: [facility]  CATH LAB;  Service: Cardiovascular;  Laterality: N/A;   LEFT HEART CATH AND CORONARY ANGIOGRAPHY N/A 03/13/2017   Procedure: LEFT HEART CATH AND CORONARY ANGIOGRAPHY;  Surgeon: [place], Emmalene Kattner M, MD;  Location: [place] CV LAB;  Service: Cardiovascular;  Laterality: N/A;   LUMBAR LAMINECTOMY/DECOMPRESSION MICRODISCECTOMY N/A 05/04/2016   Procedure: Laminectomy and Foraminotomy - Thoracic twelve-Lumbar one -Posterior Fusion Lumbar one-two;  Surgeon: Eustace Moore, MD;  Location: [place];  Service: Neurosurgery;  Laterality: N/A;   POLYPECTOMY  03/14/2018   Procedure: POLYPECTOMY;  Surgeon: Ronnette Juniper, MD;  [facility];  Service: Gastroenterology;;   POSTERIOR LUMBAR FUSION  10/18/2016   SHOULDER OPEN ROTATOR CUFF REPAIR Bilateral    TONSILLECTOMY AND ADENOIDECTOMY     TOTAL HIP ARTHROPLASTY Right 10/18/2018   Procedure: RIGHT TOTAL HIP ARTHROPLASTY ANTERIOR APPROACH;  Surgeon: Mcarthur Rossetti, MD;  [facility];  Service: Orthopedics;  Laterality: Right;   TRIGGER FINGER RELEASE     LEFT   UVULOPALATOPHARYNGOPLASTY     VASECTOMY      Current Medications: Outpatient Medications Prior to Visit  Medication Sig Dispense Refill   acetaminophen (TYLENOL) 500 MG tablet Take 1,000 mg by mouth every 8 (eight) hours as needed for mild pain or headache.      albuterol (PROVENTIL HFA;VENTOLIN HFA) 108 (90 Base) MCG/ACT inhaler Inhale 2 puffs into the lungs every 4 (four) hours as needed for wheezing or shortness of breath.     atorvastatin (LIPITOR) 10 MG tablet Take 5 mg by mouth daily at 6 PM.      augmented betamethasone dipropionate (DIPROLENE-AF) 0.05 % cream Apply 1 application topically 2 (two) times daily as needed for itching.  3   benzonatate (TESSALON) 200 MG capsule Take 1 capsule (200 mg total) by mouth 3 (three) times daily as needed for cough. 30 capsule 1   Budeson-Glycopyrrol-Formoterol (BREZTRI AEROSPHERE) 160-9-4.8 MCG/ACT AERO Inhale 2 puffs into the lungs 2 (two)  times daily. 10.7 g 5   celecoxib (CELEBREX) 200 MG capsule Take 200 mg by mouth 2 (two) times daily as needed for moderate pain.  0  Cholecalciferol (VITAMIN D-3) 5000 units TABS Take 5,000 Units by mouth daily.     Coenzyme Q10 100 MG capsule Take 100 mg by mouth daily.      JARDIANCE 25 MG TABS tablet Take 1 tablet by mouth daily.     KLOR-CON M20 20 MEQ tablet TAKE 2 TABLETS BY MOUTH TWICE A DAY 60 tablet 1   losartan (COZAAR) 25 MG tablet TAKE 1 TABLET BY MOUTH EVERY DAY 90 tablet 3   methocarbamol (ROBAXIN) 500 MG tablet Take 1 tablet (500 mg total) by mouth every 8 (eight) hours as needed for muscle spasms. 60 tablet 1   metoprolol tartrate (LOPRESSOR) 25 MG tablet Take 12.5 mg by mouth 2 (two) times daily.     montelukast (SINGULAIR) 10 MG tablet Take 10 mg by mouth at bedtime.     nitroGLYCERIN (NITROSTAT) 0.4 MG SL tablet Place 1 tablet (0.4 mg total) under the tongue every 5 (five) minutes as needed for chest pain (up to the tablets max. IF you are still have Chest pain after the 3rd tablet call 911.). 25 tablet 5   pantoprazole (PROTONIX) 20 MG tablet Take 20 mg by mouth daily.     Polyethyl Glycol-Propyl Glycol (SYSTANE OP) Place 1 drop into both eyes daily as needed (for dry eyes).      psyllium (METAMUCIL) 58.6 % packet Take 1 packet by mouth daily.     rivaroxaban (XARELTO) 20 MG TABS tablet Take 1 tablet (20 mg total) by mouth daily with supper. 30 tablet    sildenafil (VIAGRA) 100 MG tablet Take 100 mg by mouth as needed for erectile dysfunction.      Skin Protectants, Misc. (EUCERIN) cream Apply 1 application topically as needed for dry skin.     spironolactone (ALDACTONE) 25 MG tablet Take 25 mg by mouth daily.     tamsulosin (FLOMAX) 0.4 MG CAPS Take 0.4 mg by mouth every evening.      topiramate (TOPAMAX) 25 MG capsule Take 50 mg by mouth 2 (two) times daily.      furosemide (LASIX) 80 MG tablet Take 80 mg by mouth daily. Take 80 mg in the morning      No  facility-administered medications prior to visit.     Allergies:   Other, Ace inhibitors, Amoxicillin-pot clavulanate, Nifedipine, Quinolones, Adhesive [tape], Codeine, Latex, Metformin, and Morphine   Social History   Socioeconomic History   Marital status: Married    Spouse name: Not on file   Number of children: 3   Years of education: Not on file   Highest education level: Not on file  Occupational History   Occupation: Retired from Scientist, clinical (histocompatibility and immunogenetics): RETIRED  Tobacco Use   Smoking status: Former    Packs/day: 1.50    Years: 30.00    Pack years: 45.00    Types: Cigarettes    Start date: 1958    Quit date: 01/24/1992    Years since quitting: 28.4   Smokeless tobacco: Never  Vaping Use   Vaping Use: Never used  Substance and Sexual Activity   Alcohol use: No   Drug use: No   Sexual activity: Not Currently  Other Topics Concern   Not on file  Social History Narrative   Not on file   Social Determinants of Health   Financial Resource Strain: Not on file  Food Insecurity: Not on file  Transportation Needs: Not on file  Physical Activity: Not on file  Stress: Not on file  Social Connections: Not on file     Family History:  The patient's family history includes Diabetes in his mother; Emphysema (age of onset: 48) in his father; Heart attack in his sister; Heart disease in his mother; Other in his mother.   Review of Systems:   Please see the history of present illness.    All other systems reviewed and are otherwise negative except as noted above.   Physical Exam:    VS:  BP (!) 104/56 (BP Location: Left Arm, Patient Position: Sitting, Cuff Size: Normal)   Pulse (!) 50   Ht 5\' 7"  (1.702 m)   Wt 254 lb (115.2 kg)   SpO2 95%   BMI 39.78 kg/m    GENERAL:  Well appearing,  obese WM in NAD HEENT:  PERRL, EOMI, sclera are clear. Oropharynx is clear. NECK:  No jugular venous distention, carotid upstroke brisk and symmetric, no bruits, no thyromegaly or  adenopathy LUNGS:  Clear to auscultation bilaterally CHEST:  Unremarkable HEART:  RRR,  PMI not displaced or sustained,S1 and S2 within normal limits, no S3, no S4: no clicks, no rubs, no murmurs ABD:  Soft, nontender. BS +, no masses or bruits. No hepatomegaly, no splenomegaly EXT:  2 + pulses throughout, 1+ edema, no cyanosis no clubbing SKIN:  Warm and dry.  No rashes NEURO:  Alert and oriented x 3. Cranial nerves II through XII intact. PSYCH:  Cognitively intact        Wt Readings from Last 3 Encounters:  07/13/20 254 lb (115.2 kg)  07/05/20 248 lb (112.5 kg)  05/28/20 249 lb (112.9 kg)      Studies/Labs Reviewed:   EKG:  EKG is not ordered today. Ecg on June 13 showed NSR with first degree AV block. I have personally reviewed and interpreted this study.   Recent Labs: 11/24/2019: BUN 25; Creatinine, Ser 1.11; Potassium 3.7; Sodium 138   Lipid Panel    Component Value Date/Time   CHOL 118 01/07/2013 0410   TRIG 133 01/07/2013 0410   HDL 40 01/07/2013 0410   CHOLHDL 3.0 01/07/2013 0410   VLDL 27 01/07/2013 0410   LDLCALC 51 01/07/2013 0410   Labs dated 07/11/16: cholesterol 114, triglycerides 80, HDL 46, LDL 52. A1c 7.1%.  Dated 01/24/17: cholesterol 110, triglycerides 50, HDL 52, LDL 48. A1c 7.8%.  Dated 07/16/17: cholesterol 106, triglycerides 72, HDL 50, LDL 41. Creatinine 1.36. Hgb 12.8. Other chemistries and TSH normal.  Dated 10/10/18: cholesterol 100, triglycerides 54, HDL 51, LDL 38.  Dated 10/28/19: cholesterol 97, triglycerides 35, HDL 53, LDL 38. Iron level 125. A1c 6.7 %. TSH normal.  Dated 11/26/19: creatinine 1.19. glucose 122. Hgb 12.0. plts 125K. Other chemistries normal.   Additional studies/ records that were reviewed today include:   Myoview 03/06/17: Study Highlights   The left ventricular ejection fraction is normal (55-65%). Nuclear stress EF: 61%. There is a small defect of mild severity present in the basal inferior, mid inferior and apex  location. The defect is partially reversible. Overall image quality is poor due to soft tissue attenuation. Cannot rule out a small area of ischemia. There is evidence of transient ischemic dilatation with a TID of 1.27. This is an intermediate risk study. There was no ST segment deviation noted during stress    Echo 03/07/17: Study Conclusions   - Left ventricle: The cavity size was normal. Wall thickness was   increased in a pattern of mild LVH. Systolic function was normal.   The estimated  ejection fraction was in the range of 55% to 60%.   Wall motion was normal; there were no regional wall motion   abnormalities. Left ventricular diastolic function parameters   were normal. - Aortic valve: There was very mild stenosis. Valve area (VTI):   1.81 cm^2. Valve area (Vmax): 1.94 cm^2. Valve area (Vmean): 1.85   cm^2. - Right atrium: The atrium was mildly dilated. - Atrial septum: No defect or patent foramen ovale was identified. - Pulmonary arteries: PA peak pressure: 54 mm Hg (S).  Cardiac cath 03/15/17: Conclusion     Previously placed Ost 2nd Mrg to 2nd Mrg stent (unknown type) is widely patent. There is no aortic valve regurgitation.   1. No significant obstructive CAD 2. Aneurysmal thoracic aorta.    Plan: continue medical management . Consider pulmonary evaluation for symptoms of dyspnea.       Referred for: Dyspnea on exertion   Procedure: This patient underwent staged symptom-limited exercise testing using an individualized bike protocol with expired gas analysis metabolic evaluation during exercise.  Demographics  Age: 6 Ht. (in.) 67.0 Wt. (lb) 272.4 BMI: 42.7      Predicted Peak VO2: 16.0  Gender: Male Ht (cm) 170.2 Wt. (kg) 123.6    Results  Pre-Exercise PFTs   FVC 2.01 (54%)       FEV1 1.21 (43%)         FEV1/FVC 60         MVV 68 (60%)        Exercise Time:    7:00      Watts: 60  RPE: 15  Reason stopped: patient ended test due to dyspnea  (9/10)  Additional symptoms: chest tightness (6/10) and lightheaded (5/10)  Resting HR: 55 Peak HR: 107   (49% age predicted max HR)  BP rest: 122/70 BP peak: 170/82  Peak VO2: 11.4 (71% predicted peak VO2)  VE/VCO2 slope:  40  OUES: 1.82  Peak RER: 0.96  Ventilatory Threshold: 8.7 (54% predicted and 76% measured peak VO2)  Peak RR 36  Peak Ventilation:  52.2  VE/MVV:  77%  PETCO2 at peak:  30  O2pulse:  13   (93% predicted O2pulse)   Interpretation  Notes: Patient gave a good effort although he was dyspneic. Pulse-oximetry at rest 97% with a low of 93% at peak exercise. Exercise was performed on a cycle ergometer starting at [facility] and increasing by 10W/min.   ECG:   Resting ECG in sinus bradycardia with 1st degree AV block and incomplete right bundle branch block. HR response blunted. There were frequent PVCs at rest with suppression during exercise with no sustained arrhythmias and no ST-T changes. BP response appropriate.   PFT:  Pre-exercise spirometry demonstrates severe obstruction. MVV below normal range.    CPX:  Exercise Capacity- Exercise testing with gas exchange demonstrates a mildly reduced peak VO2 of 11.4 ml/kg/min (71% of the age/gender/weight matched sedentary norms). The RER of 0.96 indicates a submaximal effort. When adjusted to the patient's ideal body weight of 162.6 lb (73.7 kg) the peak VO2 is 16.0 ml/kg (ibw)/min (70% of the ibw-adjusted predicted).   Cardiovascular response- The O2pulse (a surrogate for strokevolume) increased with incremental exercise reaching peak of 13 ml/beat (93% predicted). DeltaVO2/Delta WR is 8.4 Indicating no clear evidence of cardiovascular impairment.  Ventilatory response- The VE/VCO2 slope is elevated and indicates Increased dead space ventilation. The oxygen uptake efficiency slope (OUES) is 1.82. The VO2 at the ventilatory threshold was normal at 54% of the  predicted peak VO2 and 76% measured PVO2. At peak exercise, the  ventilation reached 54% of the measured MVV and breathing reserve was 16 indicating ventilatory reserve was nearly depleted.  PETCO2 was normal at 28mmHg during exercise.   Conclusion: The interpretation of this test is limited due to submaximal effort during the exercise. Based on available data, exercise testing with gas exchange demonstrates mild functional impairment when compared to matched sedentary norms. Suspect dyspnea is multifactorial, there is evidence of effect of deconditioning and primary limitation due to body habitus.   In addition he had inadequate heart rate response to exercise and a state of hyperventilation at peak exercise, which induced an elevated VE/VCO2 slope. He approached depletion of ventilatory reserve which may be due to his obstructive lung disease.  Test, report and preliminary impression by: Landis Martins, MS, ACSM-RCEP 09/26/2017 3:03 PM  FINALIZED Marshell Garfinkel MD [facility] 10/08/2017, 10:04 AM  Assessment:    1. Paroxysmal atrial fibrillation (HCC)   2. Acute on chronic diastolic CHF (congestive heart failure) ([place])   3. Pulmonary HTN ([place])   4. Coronary artery disease involving native coronary artery of native heart without angina pectoris   5. Obstructive sleep apnea      Plan:   In order of problems listed above:  1. Paroxysmal Atrial Fibrillation/ Long-term Anticoagulation - s/p DCCV in August 2018.  - s/p Afib and Aflutter ablation in April 2019. Now off antiarrhythmic therapy. - Maintaining NSR - This patients CHA2DS2-VASc Score and unadjusted Ischemic Stroke Rate (% per year) is equal to 9.7 % stroke rate/year from a score of 6 (HTN, DM, Vascular, Age, TE (2)). He denies any evidence of active bleeding. Continue Xarelto for anticoagulation.   2. CAD - s/p BMS to 2nd OM in 2012, cardiac cath Feb 2019 showed continued patency with nonobstructive disease. - asymptomatic.  - Continue  statin therapy. No ASA  secondary to need for Xarelto.  3. Chronic diastolic CHF - echo in 2637 showed a preserved EF of 55-60%. He has increased edema and weight due to increased sodium intake. -. Note right heart findings in 2016.  - Now on Jardiance  - increase lasix to 40 mg bid for 4 days then once a day.  - reinforce need for sodium restriction.  4. HTN - BP is well-controlled  - continue  Current therapy  5. HLD -  LDL 38.  - continue Atorvastatin 10mg  daily.   6. Thoracic aortic aneurysm  - stable at 5.0 cm by last CT Scan. - followed by Dr. Servando Snare with CT Surgery.   7. Chronic respiratory failure due to morbid obesity and OSA. Followed by pulmonary. - recent CPX suggests major limitation is deconditioning and body habitus. Improved with weight loss.   8. Pulmonary HTN secondary to #3 and #7.  Cardiac cath in 2016 showed mild pulmonary venous HTN. Last Echo in Sept 2019 showed normal pulmonary pressure.   9. Rotator cuff injury. Plans surgery in 3-4 months. Should be ok from a cardiac standpoint. Will need to hold Xarelto for procedure.    Signed, Lorenza Winkleman [place], [place] 07/13/2020 [facility] [address] [place], [place] [address] Phone: [phone]

## 2020-07-02 ENCOUNTER — Telehealth: Payer: Self-pay | Admitting: *Deleted

## 2020-07-02 DIAGNOSIS — M25511 Pain in right shoulder: Secondary | ICD-10-CM | POA: Diagnosis not present

## 2020-07-02 DIAGNOSIS — M25512 Pain in left shoulder: Secondary | ICD-10-CM | POA: Diagnosis not present

## 2020-07-02 NOTE — Telephone Encounter (Signed)
   [place] Pre-operative Risk Assessment    Patient Name: Carlos Dawson  DOB: [birth year]  MRN: [id number]   Request for surgical clearance:  What type of surgery is being performed? RIGHT TOTAL SHOULDER REPLACEMENT    When is this surgery scheduled? TBD   What type of clearance is required (medical clearance vs. Pharmacy clearance to hold med vs. Both)? BOTH  Are there any medications that need to be held prior to surgery and how long?[facility]   Practice name and name of physician performing surgery? [facility] DR Ophelia Charter   What is the office phone number? [phone] [phone]    7.   What is the office fax number? [place]   Anesthesia type (None, local, MAC, general) ?

## 2020-07-05 ENCOUNTER — Ambulatory Visit (INDEPENDENT_AMBULATORY_CARE_PROVIDER_SITE_OTHER): Payer: Medicare Other | Admitting: Internal Medicine

## 2020-07-05 ENCOUNTER — Other Ambulatory Visit: Payer: Self-pay

## 2020-07-05 ENCOUNTER — Telehealth: Payer: Self-pay | Admitting: Pulmonary Disease

## 2020-07-05 ENCOUNTER — Encounter: Payer: Self-pay | Admitting: Internal Medicine

## 2020-07-05 VITALS — BP 104/50 | HR 58 | Ht 67.0 in | Wt 248.0 lb

## 2020-07-05 DIAGNOSIS — I5032 Chronic diastolic (congestive) heart failure: Secondary | ICD-10-CM | POA: Diagnosis not present

## 2020-07-05 DIAGNOSIS — G4733 Obstructive sleep apnea (adult) (pediatric): Secondary | ICD-10-CM | POA: Diagnosis not present

## 2020-07-05 DIAGNOSIS — I4892 Unspecified atrial flutter: Secondary | ICD-10-CM

## 2020-07-05 DIAGNOSIS — I4819 Other persistent atrial fibrillation: Secondary | ICD-10-CM | POA: Diagnosis not present

## 2020-07-05 DIAGNOSIS — I10 Essential (primary) hypertension: Secondary | ICD-10-CM

## 2020-07-05 MED ORDER — NITROGLYCERIN 0.4 MG SL SUBL: 0.4000 mg | SUBLINGUAL_TABLET | SUBLINGUAL | 5 refills | Status: DC | PRN

## 2020-07-05 NOTE — Telephone Encounter (Signed)
    Carlos Dawson DOB:  January 14, 1941  MRN:  [id number]   Primary Cardiologist: Peter Martinique, MD  Chart reviewed as part of pre-operative protocol coverage. Given past medical history and time since last visit, based on ACC/AHA guidelines, FATE CASTER would be at acceptable risk for the planned procedure without further cardiovascular testing.   Pt was seen today by Dr. Rayann Heman for follow up. He was without symptoms of palpitations, chest pain, or SOB.   Per pharmacy:  Patient with diagnosis of afib on Xarelto for anticoagulation.     Procedure: Shoulder Replacement Date of procedure: TBD   CHA2DS2-VASc Score = 6  This indicates a 9.7% annual risk of stroke. The patient's score is based upon: CHF History: Yes HTN History: Yes Diabetes History: Yes Stroke History: No Vascular Disease History: Yes Age Score: 2 Gender Score: 0   CrCl 65 ml/min   Chart notes history of bilateral pulmonary embolism in 2008.  Nothing in chart to indicate whether provoked or not, but nothing since that time.   Per office protocol, patient can hold Xarelto for 3 days prior to procedure.     I will route this recommendation to the requesting party via Epic fax function and remove from pre-op pool.  Please call with questions.  Kathyrn Drown, NP 07/05/2020, 4:56 PM

## 2020-07-05 NOTE — Progress Notes (Signed)
PCP: Josetta Huddle, MD Primary Cardiologist: Dr Martinique Primary EP: Dr Rayann Heman  Carlos Dawson is a 80 y.o. male who presents today for routine electrophysiology followup.  Since last being seen in our clinic, the patient reports doing reasonably well.  He does not think that he has had afib.   With warmer weather, he feels that he has had more shortness of breath and chest tightness.  Edema is stable.  Today, he denies symptoms of palpitations,  dizziness, presyncope, or syncope.  The patient is otherwise without complaint today.   Past Medical History:  Diagnosis Date   Anticoagulant long-term use    Aortic root enlargement (HCC)    Ascending aortic aneurysm ([place])    recent scan in October 2012 showing no change; followed by Dr. Servando Snare   ASCVD (arteriosclerotic cardiovascular disease)    Prior BMS to the 2nd OM in September 2012; with repeat cath in October showing patency   CAD (coronary artery disease)    a. s/p BMS to 2nd OM in Sept 2012; b. LexiScan Myoview (12/2012):  Inf infarct; bowel and motion artifact make study difficult to interpret; no ischemia; not gated; Low Risk   CHF (congestive heart failure) (HCC)    no recent issues 10/13/14   Chronic back pain    "all over my back" (05/11/2017)   Colonic polyp    Contact lens/glasses fitting    Diastolic dysfunction    DVT (deep venous thrombosis) ([place])    ?LLE   Frequent headaches    "probably weekly" (05/11/2017)   Generalized headaches    neck stenosis   GERD (gastroesophageal reflux disease)    Hearing loss    Hearing loss    more so on left   Hemorrhoids    History of stomach ulcers    Hypertension    IBS (irritable bowel syndrome)    LVH (left ventricular hypertrophy)    Mild intermittent asthma    OA (osteoarthritis)    "all over" (05/11/2017)   Obesities, morbid (HCC)    OSA (obstructive sleep apnea)    PSG 03/30/97 AHI 21, BPAP 13/9   OSA on CPAP    PAF (paroxysmal atrial fibrillation) ([place])    a. on  Xarelto b. s/p DCCV in 08/2016; b. Tikosyn failed 04/16/17 with plans for Multaq and possible Afib ablation with Dr. Rayann Heman   Pneumonia    'several times" (05/11/2017)   Prostate CA [facility])    Oncologist  DR. Daralene Milch [facility] 09/24/14, undetermined tx   prostate; S/P "radiation and hormone injections"   Pulmonary embolism ([place]) 2008   "both lungs"   SOB (shortness of breath)    on excertion   Thoracic aortic aneurysm (HCC)    Aortic Size Index=     5.0    /Body surface area is 2.43 meters squared. = 2.05  < 2.75 cm/m2      4% risk per year 2.75 to 4.25          8% risk per year > 4.25 cm/m2    20% risk per year   Stable aneurysmal dilation of the ascending aorta with maximum AP diameter of 4.8 cm. Stable area of narrowing of the proximal most portion of the descending aorta measuring 2 cm., previously identified as an area of coarctation. No evidence of aortic dissection.  Coronary artery disease.  Normal appearance of the lungs.   Electronically Signed   By: Fidela Salisbury M.D.   On: 10/01/2014 08:50  Type II diabetes mellitus (HCC)    metphormin, average 154 dx 2017   Past Surgical History:  Procedure Laterality Date   ACHILLES TENDON REPAIR Bilateral    AORTIC ARCH ANGIOGRAPHY N/A 03/13/2017   Procedure: AORTIC ARCH ANGIOGRAPHY;  Surgeon: Martinique, [name], MD;  Location: [facility];  Service: Cardiovascular;  Laterality: N/A;   APPENDECTOMY     ATRIAL FIBRILLATION ABLATION  05/11/2017   ATRIAL FIBRILLATION ABLATION N/A 05/11/2017   Procedure: ATRIAL FIBRILLATION ABLATION;  Surgeon: [name], MD;  Location: [facility];  Service: Cardiovascular;  Laterality: N/A;   BACK SURGERY     "I've had 7 back and 1 neck ORs" (05/11/2017)   BIOPSY  03/14/2018   Procedure: BIOPSY;  Surgeon: Ronnette Juniper, MD;  Location: [facility];  Service: Gastroenterology;;  EGD and Colon   CARDIAC CATHETERIZATION  2006   CARPAL TUNNEL RELEASE Bilateral    LEFT   CATARACT EXTRACTION W/  INTRAOCULAR LENS  IMPLANT, BILATERAL Bilateral    CERVICAL SPINE SURGERY  06/02/2010   lower back and neck   COLONOSCOPY N/A 03/14/2018   Procedure: COLONOSCOPY;  Surgeon: Ronnette Juniper, MD;  Location: [facility];  Service: Gastroenterology;  Laterality: N/A;   COLONOSCOPY WITH PROPOFOL N/A 12/29/2014   Procedure: COLONOSCOPY WITH PROPOFOL;  Surgeon: Garlan Fair, MD;  Location: [facility];  Service: Endoscopy;  Laterality: N/A;   CORONARY ANGIOPLASTY WITH STENT PLACEMENT  October 2012   CORONARY STENT PLACEMENT  Sept 2012   2nd OM with BMS   ESOPHAGOGASTRODUODENOSCOPY N/A 03/14/2018   Procedure: ESOPHAGOGASTRODUODENOSCOPY (EGD);  Surgeon: Ronnette Juniper, MD;  Location: [facility];  Service: Gastroenterology;  Laterality: N/A;   HEMORROIDECTOMY     LAMINECTOMY  05/30/2012   L 4 L5   LAMINECTOMY WITH POSTERIOR LATERAL ARTHRODESIS LEVEL 3 N/A 10/18/2016   Procedure: Posterior Lateral Fusion - Lumbar One-Four, segmental instrumentation Lumbar One-Five,  decompression,;  Surgeon: Eustace Moore, MD;  Location: [facility];  Service: Neurosurgery;  Laterality: N/A;   LAPAROSCOPIC CHOLECYSTECTOMY     LAPAROSCOPIC GASTRIC BANDING     LEFT AND RIGHT HEART CATHETERIZATION WITH CORONARY ANGIOGRAM N/A 05/07/2014   Procedure: LEFT AND RIGHT HEART CATHETERIZATION WITH CORONARY ANGIOGRAM;  Surgeon: [name] Martinique, MD;  Location: [facility];  Service: Cardiovascular;  Laterality: N/A;   LEFT HEART CATH AND CORONARY ANGIOGRAPHY N/A 03/13/2017   Procedure: LEFT HEART CATH AND CORONARY ANGIOGRAPHY;  Surgeon: Martinique, [name], MD;  Location: [place] [facility];  Service: Cardiovascular;  Laterality: N/A;   LUMBAR LAMINECTOMY/DECOMPRESSION MICRODISCECTOMY N/A 05/04/2016   Procedure: Laminectomy and Foraminotomy - Thoracic twelve-Lumbar one -Posterior Fusion Lumbar one-two;  Surgeon: Eustace Moore, MD;  Location: [place];  Service: Neurosurgery;  Laterality: N/A;   POLYPECTOMY  03/14/2018   Procedure: POLYPECTOMY;  Surgeon:  Ronnette Juniper, MD;  Location: [facility];  Service: Gastroenterology;;   POSTERIOR LUMBAR FUSION  10/18/2016   SHOULDER OPEN ROTATOR CUFF REPAIR Bilateral    TONSILLECTOMY AND ADENOIDECTOMY     TOTAL HIP ARTHROPLASTY Right 10/18/2018   Procedure: RIGHT TOTAL HIP ARTHROPLASTY ANTERIOR APPROACH;  Surgeon: Mcarthur Rossetti, MD;  Location: [facility];  Service: Orthopedics;  Laterality: Right;   TRIGGER FINGER RELEASE     LEFT   UVULOPALATOPHARYNGOPLASTY     VASECTOMY      ROS- all systems are reviewed and negatives except as per HPI above  Current Outpatient Medications  Medication Sig Dispense Refill   acetaminophen (TYLENOL) 500 MG tablet  Take 1,000 mg by mouth every 8 (eight) hours as needed for mild pain or headache.      albuterol (PROVENTIL HFA;VENTOLIN HFA) 108 (90 Base) MCG/ACT inhaler Inhale 2 puffs into the lungs every 4 (four) hours as needed for wheezing or shortness of breath.     atorvastatin (LIPITOR) 10 MG tablet Take 5 mg by mouth daily at 6 PM.      augmented betamethasone dipropionate (DIPROLENE-AF) 0.05 % cream Apply 1 application topically 2 (two) times daily as needed for itching.  3   benzonatate (TESSALON) 200 MG capsule Take 1 capsule (200 mg total) by mouth 3 (three) times daily as needed for cough. 30 capsule 1   Budeson-Glycopyrrol-Formoterol (BREZTRI AEROSPHERE) 160-9-4.8 MCG/ACT AERO Inhale 2 puffs into the lungs 2 (two) times daily. 10.7 g 5   celecoxib (CELEBREX) 200 MG capsule Take 200 mg by mouth 2 (two) times daily as needed for moderate pain.  0   Cholecalciferol (VITAMIN D-3) 5000 units TABS Take 5,000 Units by mouth daily.     Coenzyme Q10 100 MG capsule Take 100 mg by mouth daily.      furosemide (LASIX) 80 MG tablet Take 80 mg by mouth daily. Take 80 mg in the morning      JARDIANCE 25 MG TABS tablet Take 1 tablet by mouth daily.     KLOR-CON M20 20 MEQ tablet TAKE 2 TABLETS BY MOUTH TWICE A DAY 60 tablet 1   losartan (COZAAR) 25 MG tablet TAKE 1  TABLET BY MOUTH EVERY DAY 90 tablet 3   methocarbamol (ROBAXIN) 500 MG tablet Take 1 tablet (500 mg total) by mouth every 8 (eight) hours as needed for muscle spasms. 60 tablet 1   metoprolol tartrate (LOPRESSOR) 25 MG tablet Take 12.5 mg by mouth 2 (two) times daily.     montelukast (SINGULAIR) 10 MG tablet Take 10 mg by mouth at bedtime.     nitroGLYCERIN (NITROSTAT) 0.4 MG SL tablet Place 1 tablet (0.4 mg total) under the tongue every 5 (five) minutes as needed for chest pain. 25 tablet 5   pantoprazole (PROTONIX) 20 MG tablet Take 20 mg by mouth daily.     Polyethyl Glycol-Propyl Glycol (SYSTANE OP) Place 1 drop into both eyes daily as needed (for dry eyes).      psyllium (METAMUCIL) 58.6 % packet Take 1 packet by mouth daily.     rivaroxaban (XARELTO) 20 MG TABS tablet Take 1 tablet (20 mg total) by mouth daily with supper. 30 tablet    sildenafil (VIAGRA) 100 MG tablet Take 100 mg by mouth as needed for erectile dysfunction.      Skin Protectants, Misc. (EUCERIN) cream Apply 1 application topically as needed for dry skin.     spironolactone (ALDACTONE) 25 MG tablet Take 25 mg by mouth daily.     tamsulosin (FLOMAX) 0.4 MG CAPS Take 0.4 mg by mouth every evening.      topiramate (TOPAMAX) 25 MG capsule Take 50 mg by mouth 2 (two) times daily.      No current facility-administered medications for this visit.    Physical Exam: Vitals:   07/05/20 1552  BP: (!) 104/50  Pulse: (!) 58  SpO2: 98%  Weight: 248 lb (112.5 kg)  Height: 5\' 7"  (1.702 m)    GEN- The patient is well appearing, alert and oriented x 3 today.   Head- normocephalic, atraumatic Eyes-  Sclera clear, conjunctiva pink Ears- hearing intact Oropharynx- clear Lungs- Clear to ausculation bilaterally, normal  work of breathing Heart- Regular rate and rhythm, no murmurs, rubs or gallops, PMI not laterally displaced GI- soft, NT, ND, + BS Extremities- no clubbing, cyanosis, +1 edema  Wt Readings from Last 3 Encounters:   07/05/20 248 lb (112.5 kg)  05/28/20 249 lb (112.9 kg)  04/15/20 240 lb 9.6 oz (109.1 kg)    EKG tracing ordered today is personally reviewed and shows sinus rhythm 58 bpm , PR 256 msec, Qtc 443 msec, incomplete rbbb  Assessment and Plan:  Paroxysmal atrial fibrillation/ atrial flutter Doing well post ablation off AAD therapy Chads2vasc score is 6.  Continue xarelto  2. HTN Stable No change required today  3. OSA Compliance with CPAP advised  4. Chronic diastolic dysfunction Stable No change required today  5. Thoracic aneurysm Follows with CV surgery  6. CAD He has had recently more chest tightness.  He has scheduled followup with Dr Martinique in 1 week.    Risks, benefits and potential toxicities for medications prescribed and/or refilled reviewed with patient today.   Return to AF clinic every 6 months I will see when needed  [name] MD, [facility] 07/05/2020 4:15 PM

## 2020-07-05 NOTE — Telephone Encounter (Signed)
Rec'd faxed surgery clearance from [facility] for Rt total shoulder replacement by Dr. Ophelia Charter. Patient last seen on 05/24/2020 - ok to do risk assessment? Fax number [phone]. -pr

## 2020-07-05 NOTE — Telephone Encounter (Signed)
Patient with diagnosis of afib on Xarelto for anticoagulation.    Procedure: Shoulder Replacement Date of procedure: TBD  CHA2DS2-VASc Score = 6  This indicates a 9.7% annual risk of stroke. The patient's score is based upon: CHF History: Yes HTN History: Yes Diabetes History: Yes Stroke History: No Vascular Disease History: Yes Age Score: 2 Gender Score: 0     CrCl 65 ml/min  Chart notes history of bilateral pulmonary embolism in 2008.  Nothing in chart to indicate whether provoked or not, but nothing since that time.  Per office protocol, patient can hold Xarelto for 3 days prior to procedure.

## 2020-07-05 NOTE — Patient Instructions (Addendum)
Medication Instructions:  Your physician recommends that you continue on your current medications as directed. Please refer to the Current Medication list given to you today.  Labwork: None ordered.  Testing/Procedures: None ordered.  Follow-Up: Your physician wants you to follow-up in: 6 month follow up in the Afib Clinic. They will contact you to schedule.    Any Other Special Instructions Will Be Listed Below (If Applicable).  If you need a refill on your cardiac medications before your next appointment, please call your pharmacy.

## 2020-07-06 ENCOUNTER — Encounter: Payer: Self-pay | Admitting: Pulmonary Disease

## 2020-07-06 NOTE — Telephone Encounter (Signed)
Letter faxed to [facility] per Dr Halford Chessman. Called and made [facility] office aware. Was given fax number [phone] due to writer faxed the number on chart note [phone] with communication error message. Success result on fax to the [phone] number. Nothing further needed at this time.

## 2020-07-08 DIAGNOSIS — I10 Essential (primary) hypertension: Secondary | ICD-10-CM | POA: Diagnosis not present

## 2020-07-08 DIAGNOSIS — I5030 Unspecified diastolic (congestive) heart failure: Secondary | ICD-10-CM | POA: Diagnosis not present

## 2020-07-08 DIAGNOSIS — N400 Enlarged prostate without lower urinary tract symptoms: Secondary | ICD-10-CM | POA: Diagnosis not present

## 2020-07-08 DIAGNOSIS — D649 Anemia, unspecified: Secondary | ICD-10-CM | POA: Diagnosis not present

## 2020-07-08 DIAGNOSIS — J45909 Unspecified asthma, uncomplicated: Secondary | ICD-10-CM | POA: Diagnosis not present

## 2020-07-08 DIAGNOSIS — E785 Hyperlipidemia, unspecified: Secondary | ICD-10-CM | POA: Diagnosis not present

## 2020-07-08 DIAGNOSIS — K219 Gastro-esophageal reflux disease without esophagitis: Secondary | ICD-10-CM | POA: Diagnosis not present

## 2020-07-08 DIAGNOSIS — E119 Type 2 diabetes mellitus without complications: Secondary | ICD-10-CM | POA: Diagnosis not present

## 2020-07-08 DIAGNOSIS — N183 Chronic kidney disease, stage 3 unspecified: Secondary | ICD-10-CM | POA: Diagnosis not present

## 2020-07-08 DIAGNOSIS — C61 Malignant neoplasm of prostate: Secondary | ICD-10-CM | POA: Diagnosis not present

## 2020-07-08 DIAGNOSIS — I480 Paroxysmal atrial fibrillation: Secondary | ICD-10-CM | POA: Diagnosis not present

## 2020-07-08 DIAGNOSIS — I2510 Atherosclerotic heart disease of native coronary artery without angina pectoris: Secondary | ICD-10-CM | POA: Diagnosis not present

## 2020-07-09 ENCOUNTER — Ambulatory Visit: Payer: Medicare Other | Admitting: Adult Health

## 2020-07-13 ENCOUNTER — Ambulatory Visit (INDEPENDENT_AMBULATORY_CARE_PROVIDER_SITE_OTHER): Payer: Medicare Other | Admitting: Cardiology

## 2020-07-13 ENCOUNTER — Other Ambulatory Visit: Payer: Self-pay

## 2020-07-13 ENCOUNTER — Encounter: Payer: Self-pay | Admitting: Cardiology

## 2020-07-13 VITALS — BP 104/56 | HR 50 | Ht 67.0 in | Wt 254.0 lb

## 2020-07-13 DIAGNOSIS — G4733 Obstructive sleep apnea (adult) (pediatric): Secondary | ICD-10-CM

## 2020-07-13 DIAGNOSIS — I2510 Atherosclerotic heart disease of native coronary artery without angina pectoris: Secondary | ICD-10-CM

## 2020-07-13 DIAGNOSIS — I480 Paroxysmal atrial fibrillation: Secondary | ICD-10-CM | POA: Diagnosis not present

## 2020-07-13 DIAGNOSIS — I5033 Acute on chronic diastolic (congestive) heart failure: Secondary | ICD-10-CM

## 2020-07-13 DIAGNOSIS — I272 Pulmonary hypertension, unspecified: Secondary | ICD-10-CM

## 2020-07-13 MED ORDER — FUROSEMIDE 40 MG PO TABS: 40.0000 mg | ORAL_TABLET | Freq: Every day | ORAL | 3 refills | Status: DC

## 2020-07-13 MED ORDER — SILDENAFIL CITRATE 100 MG PO TABS: 100.0000 mg | ORAL_TABLET | ORAL | 6 refills | Status: DC | PRN

## 2020-07-13 NOTE — Patient Instructions (Signed)
Increase lasix to 40 mg twice a day for 4 days then resume once a day  You really need to restrict your sodium intake more

## 2020-07-13 NOTE — Addendum Note (Signed)
Addended by: Kathyrn Lass on: 07/13/2020 08:23 AM   Modules accepted: Orders

## 2020-07-13 NOTE — Addendum Note (Signed)
Addended by: Martinique, Kierstyn Baranowski M on: 07/13/2020 08:21 AM   Modules accepted: Orders

## 2020-07-15 DIAGNOSIS — M533 Sacrococcygeal disorders, not elsewhere classified: Secondary | ICD-10-CM | POA: Diagnosis not present

## 2020-07-24 ENCOUNTER — Other Ambulatory Visit: Payer: Self-pay | Admitting: Cardiology

## 2020-07-27 DIAGNOSIS — Z20822 Contact with and (suspected) exposure to covid-19: Secondary | ICD-10-CM | POA: Diagnosis not present

## 2020-08-06 DIAGNOSIS — K219 Gastro-esophageal reflux disease without esophagitis: Secondary | ICD-10-CM | POA: Diagnosis not present

## 2020-08-06 DIAGNOSIS — I509 Heart failure, unspecified: Secondary | ICD-10-CM | POA: Diagnosis not present

## 2020-08-06 DIAGNOSIS — C61 Malignant neoplasm of prostate: Secondary | ICD-10-CM | POA: Diagnosis not present

## 2020-08-06 DIAGNOSIS — I4891 Unspecified atrial fibrillation: Secondary | ICD-10-CM | POA: Diagnosis not present

## 2020-08-06 DIAGNOSIS — G4700 Insomnia, unspecified: Secondary | ICD-10-CM | POA: Diagnosis not present

## 2020-08-06 DIAGNOSIS — D509 Iron deficiency anemia, unspecified: Secondary | ICD-10-CM | POA: Diagnosis not present

## 2020-08-06 DIAGNOSIS — I2510 Atherosclerotic heart disease of native coronary artery without angina pectoris: Secondary | ICD-10-CM | POA: Diagnosis not present

## 2020-08-06 DIAGNOSIS — N183 Chronic kidney disease, stage 3 unspecified: Secondary | ICD-10-CM | POA: Diagnosis not present

## 2020-08-06 DIAGNOSIS — E119 Type 2 diabetes mellitus without complications: Secondary | ICD-10-CM | POA: Diagnosis not present

## 2020-08-06 DIAGNOSIS — N400 Enlarged prostate without lower urinary tract symptoms: Secondary | ICD-10-CM | POA: Diagnosis not present

## 2020-08-06 DIAGNOSIS — I10 Essential (primary) hypertension: Secondary | ICD-10-CM | POA: Diagnosis not present

## 2020-08-06 DIAGNOSIS — E785 Hyperlipidemia, unspecified: Secondary | ICD-10-CM | POA: Diagnosis not present

## 2020-08-09 ENCOUNTER — Telehealth: Payer: Self-pay | Admitting: Pulmonary Disease

## 2020-08-09 NOTE — Telephone Encounter (Signed)
Patient requesting refill on Breztri and [employer]. It looks like we sent Breztri in on 5/6 with 5 refills and Proair has not been sent in by you. Is it ok to refill proair?  Dr. Halford Chessman please advise

## 2020-08-10 MED ORDER — ALBUTEROL SULFATE HFA 108 (90 BASE) MCG/ACT IN AERS: 2.0000 | INHALATION_SPRAY | RESPIRATORY_TRACT | 1 refills | Status: DC | PRN

## 2020-08-10 MED ORDER — BREZTRI AEROSPHERE 160-9-4.8 MCG/ACT IN AERO: 2.0000 | INHALATION_SPRAY | Freq: Two times a day (BID) | RESPIRATORY_TRACT | 5 refills | Status: DC

## 2020-08-10 NOTE — Telephone Encounter (Signed)
Called and spoke with patient to let him know that his inhalers have been sent to preferred pharmacy. He expressed understanding. Nothing further needed at this time.

## 2020-08-10 NOTE — Telephone Encounter (Signed)
Okay to send refill for proair.

## 2020-08-10 NOTE — Telephone Encounter (Signed)
I have sent the medications to the pharmacy per provider request. No other concerns.

## 2020-08-13 ENCOUNTER — Other Ambulatory Visit: Payer: Self-pay | Admitting: *Deleted

## 2020-08-13 MED ORDER — ALBUTEROL SULFATE HFA 108 (90 BASE) MCG/ACT IN AERS: 2.0000 | INHALATION_SPRAY | RESPIRATORY_TRACT | 1 refills | Status: DC | PRN

## 2020-08-16 DIAGNOSIS — R040 Epistaxis: Secondary | ICD-10-CM | POA: Diagnosis not present

## 2020-08-16 DIAGNOSIS — Z7901 Long term (current) use of anticoagulants: Secondary | ICD-10-CM | POA: Diagnosis not present

## 2020-08-19 DIAGNOSIS — M7918 Myalgia, other site: Secondary | ICD-10-CM | POA: Diagnosis not present

## 2020-08-19 DIAGNOSIS — R519 Headache, unspecified: Secondary | ICD-10-CM | POA: Diagnosis not present

## 2020-08-19 DIAGNOSIS — M533 Sacrococcygeal disorders, not elsewhere classified: Secondary | ICD-10-CM | POA: Diagnosis not present

## 2020-08-20 ENCOUNTER — Telehealth: Payer: Self-pay | Admitting: Pulmonary Disease

## 2020-08-20 ENCOUNTER — Other Ambulatory Visit: Payer: Self-pay | Admitting: Pulmonary Disease

## 2020-08-20 MED ORDER — DOXYCYCLINE HYCLATE 100 MG PO TABS: 100.0000 mg | ORAL_TABLET | Freq: Two times a day (BID) | ORAL | 0 refills | Status: DC

## 2020-08-20 MED ORDER — PREDNISONE 20 MG PO TABS: ORAL_TABLET | ORAL | 0 refills | Status: DC

## 2020-08-20 NOTE — Telephone Encounter (Signed)
Called and spoke with pt letting him know that [facility] has sent Rx for abx and prednisone to pharmacy for him and he verbalized understanding. Nothing further needed.

## 2020-08-20 NOTE — Progress Notes (Unsigned)
Prescription for doxycycline and prednisone sent in 

## 2020-08-20 NOTE — Telephone Encounter (Signed)
Called and spoke with pt who states he has had complaints of chest tightness and congestion for 2 weeks now. Pt had covid 7/15 and did take meds to help treat that so the covid is better but is still having problems with cough, congestion, and chest tightness.  Pt states that he has been coughing up yellow phlegm. Pt also has had some wheezing.  Pt has been taking his breztri inhaler as prescribed, using the rescue inhaler about 3-4 times a day, and has also been taking all other meds as prescribed.  Pt said that he is prone to his symptoms turning into pna so he wants to make sure that does not happen and due to that, wants to know if there is anything we can recommend to help with his symptoms.  Dr. Jenetta Downer please advise.

## 2020-08-20 NOTE — Telephone Encounter (Signed)
Prescription for antibiotic and prednisone sent in to pharmacy

## 2020-08-23 NOTE — Telephone Encounter (Signed)
Called and spoke with patient. He wanted to make sure that his insurance would continue to pay for his cpap machine. He received a Personnel officer from [employer]. I advised him that as long as we can get a 90 day compliance and it showed that the cpap machine has corrected his apneas, he should be ok. He received his machine on July 1st and was worried since the machine does not have a modem. Per patient, he will receive a modem in the next week or so.   He denies any current problems with his machine. Mask seems to be working well.   Nothing further needed.

## 2020-09-01 DIAGNOSIS — I509 Heart failure, unspecified: Secondary | ICD-10-CM | POA: Diagnosis not present

## 2020-09-01 DIAGNOSIS — E0822 Diabetes mellitus due to underlying condition with diabetic chronic kidney disease: Secondary | ICD-10-CM | POA: Diagnosis not present

## 2020-09-01 DIAGNOSIS — D6869 Other thrombophilia: Secondary | ICD-10-CM | POA: Diagnosis not present

## 2020-09-01 DIAGNOSIS — I2510 Atherosclerotic heart disease of native coronary artery without angina pectoris: Secondary | ICD-10-CM | POA: Diagnosis not present

## 2020-09-01 DIAGNOSIS — E785 Hyperlipidemia, unspecified: Secondary | ICD-10-CM | POA: Diagnosis not present

## 2020-09-01 DIAGNOSIS — N183 Chronic kidney disease, stage 3 unspecified: Secondary | ICD-10-CM | POA: Diagnosis not present

## 2020-09-01 DIAGNOSIS — I4891 Unspecified atrial fibrillation: Secondary | ICD-10-CM | POA: Diagnosis not present

## 2020-09-01 DIAGNOSIS — D509 Iron deficiency anemia, unspecified: Secondary | ICD-10-CM | POA: Diagnosis not present

## 2020-09-01 DIAGNOSIS — I10 Essential (primary) hypertension: Secondary | ICD-10-CM | POA: Diagnosis not present

## 2020-09-01 DIAGNOSIS — K219 Gastro-esophageal reflux disease without esophagitis: Secondary | ICD-10-CM | POA: Diagnosis not present

## 2020-09-01 DIAGNOSIS — C61 Malignant neoplasm of prostate: Secondary | ICD-10-CM | POA: Diagnosis not present

## 2020-09-09 DIAGNOSIS — N529 Male erectile dysfunction, unspecified: Secondary | ICD-10-CM | POA: Diagnosis not present

## 2020-09-09 DIAGNOSIS — C61 Malignant neoplasm of prostate: Secondary | ICD-10-CM | POA: Diagnosis not present

## 2020-09-09 DIAGNOSIS — N3941 Urge incontinence: Secondary | ICD-10-CM | POA: Diagnosis not present

## 2020-10-04 ENCOUNTER — Encounter: Payer: Self-pay | Admitting: Primary Care

## 2020-10-04 ENCOUNTER — Ambulatory Visit (INDEPENDENT_AMBULATORY_CARE_PROVIDER_SITE_OTHER): Payer: Medicare Other | Admitting: Primary Care

## 2020-10-04 ENCOUNTER — Other Ambulatory Visit: Payer: Self-pay

## 2020-10-04 VITALS — BP 110/64 | HR 53 | Temp 98.5°F | Ht 67.0 in | Wt 252.6 lb

## 2020-10-04 DIAGNOSIS — Z23 Encounter for immunization: Secondary | ICD-10-CM

## 2020-10-04 DIAGNOSIS — G4733 Obstructive sleep apnea (adult) (pediatric): Secondary | ICD-10-CM

## 2020-10-04 DIAGNOSIS — J4521 Mild intermittent asthma with (acute) exacerbation: Secondary | ICD-10-CM | POA: Diagnosis not present

## 2020-10-04 DIAGNOSIS — J309 Allergic rhinitis, unspecified: Secondary | ICD-10-CM

## 2020-10-04 MED ORDER — AZELASTINE-FLUTICASONE 137-50 MCG/ACT NA SUSP
1.0000 | Freq: Two times a day (BID) | NASAL | 5 refills | Status: DC
Start: 2020-10-04 — End: 2021-05-20

## 2020-10-04 MED ORDER — AZELASTINE-FLUTICASONE 137-50 MCG/ACT NA SUSP: 1.0000 | Freq: Two times a day (BID) | NASAL | 5 refills | Status: DC

## 2020-10-04 NOTE — Progress Notes (Signed)
$'@Patient'p$  ID: Carlos Dawson, male    DOB: 06/17/1940, 80 y.o.   MRN: [id number]  Chief Complaint  Patient presents with   Follow-up    Patient reports that he recently got a new machine.     Referring provider: Josetta Huddle, MD  HPI: 80 year old male, former smoker quit in January 1994 (45-pack-year history).  Past medical history significant for OSA, mild intermittent asthma, COPD, allergic rhinitis, A. fib, coronary artery disease, chronic diastolic heart failure, pulmonary embolism, knee disease stage III,.  Patient of Dr. Halford Chessman, last seen in office on 05/28/2020.  10/04/2020 Patient is maintained on Breztri and Singulair for history of COPD with asthma.  He is compliant with CPAP and reports benefit from use.  Pressure setting 10 cm H2O.  DME company is [employer], he was waiting for new CPAP during his last visit.  Breztri made him cough more. He is back to taking Symbicort 160 two puffs twice a day, he gets this prescription through the [place]. He had covid in July 2022, vaccinated at the time. He was treated with 7 day course of levaquin. He required proair rescue inhaler during covid. He is back to baseline. He has a lot of nasal drainage. His symptoms are worse in the morning and evening.   He received new Luna machine June-July 2022. Current pressure 10cm h20. He reports 100% compliance with use. Appear machine has not been capturing data since August. He has no issues with mask fit or pressure setting. Reports benefit from use.   Airview download 04/11/20- 05/10/20 Usage 29/30 days used; 29 days (97%) Average usage 7 hours 53 mins Pressure 10cm h20 AHI 2.9  Icode connect 07/23/20-10/20/20 Days used 33/90 (36%) Average usage 7 hours 47 mins Pressure 10cm h20 Average leak- NA AHI 1.0    Allergies  Allergen Reactions   Other     Other reaction(s): cough, Other Other reaction(s): cough  Other reaction(s): wt gain   Ace Inhibitors Cough   Adhesive [Tape] Itching and Rash    Amoxicillin-Pot Clavulanate Other (See Comments)    Other reaction(s): GI Upset (intolerance)   Codeine Nausea Only    Other reaction(s): nausea Other reaction(s): nausea Other reaction(s): nausea    Latex Itching, Rash and Other (See Comments)    Bandaids   Metformin Diarrhea   Morphine Itching   Nifedipine Other (See Comments)    Other reaction(s): leg edema   Quinolones Other (See Comments)    Patient was warned about not using Cipro and similar antibiotics. Recent studies have raised concern that fluoroquinolone antibiotics could be associated with an increased risk of aortic aneurysm Fluoroquinolones have non-antimicrobial properties that might jeopardise the integrity of the extracellular matrix of the vascular wall In a  propensity score matched cohort study in [place], there was a 66% increased rate of aortic aneurysm or dissection associated with oral fluoroquinolone use, compared wit    Immunization History  Administered Date(s) Administered   Fluad Quad(high Dose 65+) 11/26/2018   Influenza Split 12/08/2010, 10/24/2011, 10/23/2012, 10/23/2013, 11/19/2014, 10/19/2015, 11/04/2016, 10/17/2017, 11/26/2018   Influenza Whole 09/23/2009, 10/23/2011   Influenza, High Dose Seasonal PF 10/24/2011, 12/04/2013, 10/19/2015, 11/06/2016, 11/13/2016, 10/27/2019   Influenza,inj,Quad PF,6+ Mos 10/23/2013, 10/24/2014   PFIZER(Purple Top)SARS-COV-2 Vaccination 02/13/2019, 03/06/2019, 12/29/2019   Pneumococcal Conjugate-13 04/24/2011, 10/23/2013, 12/04/2013, 01/23/2014   Pneumococcal Polysaccharide-23 11/22/2010, 05/09/2011   Pneumococcal-Unspecified 09/23/1997, 04/24/2011   Tdap 01/25/2008, 05/19/2008   Typhoid Inactivated 11/02/2015   Zoster Recombinat (Shingrix) 04/05/2017, 07/05/2017   Zoster, Live 06/03/2014, 10/24/2014,  07/05/2017    Past Medical History:  Diagnosis Date   Anticoagulant long-term use    Aortic root enlargement ([place])    Ascending aortic aneurysm ([place])    recent  scan in October 2012 showing no change; followed by Dr. Servando Snare   ASCVD (arteriosclerotic cardiovascular disease)    Prior BMS to the 2nd OM in September 2012; with repeat cath in October showing patency   CAD (coronary artery disease)    a. s/p BMS to 2nd OM in Sept 2012; b. LexiScan Myoview (12/2012):  Inf infarct; bowel and motion artifact make study difficult to interpret; no ischemia; not gated; Low Risk   CHF (congestive heart failure) (HCC)    no recent issues 10/13/14   Chronic back pain    "all over my back" (05/11/2017)   Colonic polyp    Contact lens/glasses fitting    Diastolic dysfunction    DVT (deep venous thrombosis) ([place])    ?LLE   Frequent headaches    "probably weekly" (05/11/2017)   Generalized headaches    neck stenosis   GERD (gastroesophageal reflux disease)    Hearing loss    Hearing loss    more so on left   Hemorrhoids    History of stomach ulcers    Hypertension    IBS (irritable bowel syndrome)    LVH (left ventricular hypertrophy)    Mild intermittent asthma    OA (osteoarthritis)    "all over" (05/11/2017)   Obesities, morbid (HCC)    OSA (obstructive sleep apnea)    PSG 03/30/97 AHI 21, BPAP 13/9   OSA on CPAP    PAF (paroxysmal atrial fibrillation) ([place])    a. on Xarelto b. s/p DCCV in 08/2016; b. Tikosyn failed 04/16/17 with plans for Multaq and possible Afib ablation with Dr. Rayann Heman   Pneumonia    'several times" (05/11/2017)   Prostate CA [facility])    Oncologist  DR. Daralene [facility] dx 09/24/14, undetermined tx   prostate; S/P "radiation and hormone injections"   Pulmonary embolism ([place]) 2008   "both lungs"   SOB (shortness of breath)    on excertion   Thoracic aortic aneurysm (HCC)    Aortic Size Index=     5.0    /Body surface area is 2.43 meters squared. = 2.05  < 2.75 cm/m2      4% risk per year 2.75 to 4.25          8% risk per year > 4.25 cm/m2    20% risk per year   Stable aneurysmal dilation of the ascending aorta with maximum AP diameter  of 4.8 cm. Stable area of narrowing of the proximal most portion of the descending aorta measuring 2 cm., previously identified as an area of coarctation. No evidence of aortic dissection.  Coronary artery disease.  Normal appearance of the lungs.   Electronically Signed   By: Fidela Salisbury M.D.   On: 10/01/2014 08:50     Type II diabetes mellitus ([place])    metphormin, average 154 dx 2017    Tobacco History: Social History   Tobacco Use  Smoking Status Former   Packs/day: 1.50   Years: 30.00   Pack years: 45.00   Types: Cigarettes   Start date: 60   Quit date: 01/24/1992   Years since quitting: 28.7  Smokeless Tobacco Never   Counseling given: Not Answered   Outpatient Medications Prior to Visit  Medication Sig Dispense Refill   acetaminophen (TYLENOL) 500 MG tablet Take  1,000 mg by mouth every 8 (eight) hours as needed for mild pain or headache.      albuterol (VENTOLIN HFA) 108 (90 Base) MCG/ACT inhaler Inhale 2 puffs into the lungs every 4 (four) hours as needed for wheezing or shortness of breath. 18 g 1   atorvastatin (LIPITOR) 10 MG tablet Take 5 mg by mouth daily at 6 PM.      augmented betamethasone dipropionate (DIPROLENE-AF) 0.05 % cream Apply 1 application topically 2 (two) times daily as needed for itching.  3   benzonatate (TESSALON) 200 MG capsule Take 1 capsule (200 mg total) by mouth 3 (three) times daily as needed for cough. 30 capsule 1   budesonide-formoterol (SYMBICORT) 160-4.5 MCG/ACT inhaler Inhale 2 puffs into the lungs 2 (two) times daily.     celecoxib (CELEBREX) 200 MG capsule Take 200 mg by mouth 2 (two) times daily as needed for moderate pain.  0   Cholecalciferol (VITAMIN D-3) 5000 units TABS Take 5,000 Units by mouth daily.     Coenzyme Q10 100 MG capsule Take 100 mg by mouth daily.      furosemide (LASIX) 40 MG tablet Take 1 tablet (40 mg total) by mouth daily. 90 tablet 3   JARDIANCE 25 MG TABS tablet Take 1 tablet by mouth daily.     KLOR-CON  M20 20 MEQ tablet TAKE 2 TABLETS BY MOUTH TWICE A DAY 60 tablet 1   losartan (COZAAR) 25 MG tablet TAKE 1 TABLET BY MOUTH EVERY DAY 90 tablet 3   methocarbamol (ROBAXIN) 500 MG tablet Take 1 tablet (500 mg total) by mouth every 8 (eight) hours as needed for muscle spasms. 60 tablet 1   metoprolol tartrate (LOPRESSOR) 25 MG tablet Take 12.5 mg by mouth 2 (two) times daily.     montelukast (SINGULAIR) 10 MG tablet Take 10 mg by mouth at bedtime.     nitroGLYCERIN (NITROSTAT) 0.4 MG SL tablet Place 1 tablet (0.4 mg total) under the tongue every 5 (five) minutes as needed for chest pain (up to the tablets max. IF you are still have Chest pain after the 3rd tablet call 911.). 25 tablet 5   pantoprazole (PROTONIX) 20 MG tablet Take 20 mg by mouth daily.     Polyethyl Glycol-Propyl Glycol (SYSTANE OP) Place 1 drop into both eyes daily as needed (for dry eyes).      predniSONE (DELTASONE) 20 MG tablet 1 p.o. twice daily for 7 days 14 tablet 0   psyllium (METAMUCIL) 58.6 % packet Take 1 packet by mouth daily.     rivaroxaban (XARELTO) 20 MG TABS tablet Take 1 tablet (20 mg total) by mouth daily with supper. 30 tablet    sildenafil (VIAGRA) 100 MG tablet Take 1 tablet (100 mg total) by mouth as needed for erectile dysfunction. 10 tablet 6   Skin Protectants, Misc. (EUCERIN) cream Apply 1 application topically as needed for dry skin.     spironolactone (ALDACTONE) 25 MG tablet Take 25 mg by mouth daily.     tamsulosin (FLOMAX) 0.4 MG CAPS Take 0.4 mg by mouth every evening.      topiramate (TOPAMAX) 25 MG capsule Take 50 mg by mouth 2 (two) times daily.      Budeson-Glycopyrrol-Formoterol (BREZTRI AEROSPHERE) 160-9-4.8 MCG/ACT AERO Inhale 2 puffs into the lungs 2 (two) times daily. (Patient not taking: Reported on 10/04/2020) 10.7 g 5   doxycycline (VIBRA-TABS) 100 MG tablet Take 1 tablet (100 mg total) by mouth 2 (two) times daily. (Patient  not taking: Reported on 10/04/2020) 14 tablet 0   No  facility-administered medications prior to visit.    Review of Systems  Review of Systems  Constitutional: Negative.   HENT:  Positive for postnasal drip.   Respiratory:  Negative for cough, chest tightness, shortness of breath and wheezing.   Psychiatric/Behavioral:  Negative for sleep disturbance.     Physical Exam  BP 110/64 (BP Location: Left Arm, Patient Position: Sitting, Cuff Size: Large)   Pulse (!) 53   Temp 98.5 F (36.9 C) (Oral)   Ht '5\' 7"'$  (1.702 m)   Wt 252 lb 9.6 oz (114.6 kg)   SpO2 95%   BMI 39.56 kg/m  Physical Exam Constitutional:      Appearance: Normal appearance.  HENT:     Head: Normocephalic and atraumatic.     Mouth/Throat:     Mouth: Mucous membranes are moist.     Pharynx: Oropharynx is clear.  Cardiovascular:     Rate and Rhythm: Normal rate and regular rhythm.  Pulmonary:     Effort: Pulmonary effort is normal.     Breath sounds: Normal breath sounds.  Neurological:     General: No focal deficit present.     Mental Status: He is alert and oriented to person, place, and time. Mental status is at baseline.  Psychiatric:        Mood and Affect: Mood normal.        Behavior: Behavior normal.        Thought Content: Thought content normal.        Judgment: Judgment normal.     Lab Results:  CBC    Component Value Date/Time   WBC 7.3 10/21/2018 0726   RBC 2.89 (L) 10/21/2018 0726   HGB 9.5 (L) 10/21/2018 0726   HGB 10.6 (L) 04/30/2017 1354   HCT 29.8 (L) 10/21/2018 0726   HCT 33.4 (L) 04/30/2017 1354   PLT 125 (L) 10/21/2018 0726   PLT 128 (L) 04/30/2017 1354   MCV 103.1 (H) 10/21/2018 0726   MCV 87 04/30/2017 1354   MCH 32.9 10/21/2018 0726   MCHC 31.9 10/21/2018 0726   RDW 14.2 10/21/2018 0726   RDW 24.2 (H) 04/30/2017 1354   LYMPHSABS 1.2 10/20/2018 0228   LYMPHSABS 2.0 04/30/2017 1354   MONOABS 1.1 (H) 10/20/2018 0228   EOSABS 0.1 10/20/2018 0228   EOSABS 0.2 04/30/2017 1354   BASOSABS 0.0 10/20/2018 0228   BASOSABS 0.0  04/30/2017 1354    BMET    Component Value Date/Time   NA 138 11/24/2019 0937   NA 143 04/30/2017 1354   K 3.7 11/24/2019 0937   CL 107 11/24/2019 0937   CO2 22 11/24/2019 0937   GLUCOSE 133 (H) 11/24/2019 0937   BUN 25 (H) 11/24/2019 0937   BUN 23 04/30/2017 1354   CREATININE 1.11 11/24/2019 0937   CREATININE 1.11 10/14/2015 0929   CALCIUM 8.7 (L) 11/24/2019 0937   GFRNONAA >60 11/24/2019 0937   GFRAA >60 10/21/2018 0726    BNP    Component Value Date/Time   BNP 191.1 (H) 04/15/2017 1038    ProBNP    Component Value Date/Time   PROBNP 96.0 05/11/2015 1443    Imaging: No results found.   Assessment & Plan:   Mild intermittent asthma Currently well controlled on high dose ICS/LABA. He did not tolerate Breztri d.t cough.   Continue Symbicort 160 two puffs twice daily and Singulair '10mg'$  at bedtime. He gets his medications through [place].  Allergic rhinitis - Add dymista nasal spray 1 puff per nostril twice daily - He can try OTC antihistamine if symptoms persist   OSA (obstructive sleep apnea) - Received new luna CPAP machine in July 2022. Patient reports 100% compliance with CPAP. Icode connect showed some interruptions in use. He will bring machine by DME store to ensure there is not an use with connection. Pressure remains at 10cm h20 with residual AHI 1.0. No significant air leaks. No changes today. Encourage weight loss efforts. FU in 6 months     Martyn Ehrich, NP 10/04/2020

## 2020-10-04 NOTE — Assessment & Plan Note (Signed)
-   Received new luna CPAP machine in July 2022. Patient reports 100% compliance with CPAP. Icode connect showed some interruptions in use. He will bring machine by DME store to ensure there is not an use with connection. Pressure remains at 10cm h20 with residual AHI 1.0. No significant air leaks. No changes today. Encourage weight loss efforts. FU in 6 months

## 2020-10-04 NOTE — Patient Instructions (Addendum)
Nice seeing you today Mr. Burke  OSA: Aim to wear CPAP every night 4-6 hours or longer Do not drive if experiencing excessive daytime fatigue or somnolence Work on weight loss efforts   COPD/asthma: Continue Symbicort 160 two puffs twice daily  Continue Singulair '10mg'$  at bedtime    Rhinitis: Add dymista nasal spray 1 puff per nostril twice daily for post nasal drip symptoms  Consider starting over the counter antihistamine if needed   Follow-up: 6 months with Dr. Halford Chessman or sooner if needed

## 2020-10-04 NOTE — Progress Notes (Signed)
Reviewed and agree with assessment/plan.   Chesley Mires, MD [facility] Pulmonary/Critical Care 10/04/2020, 11:21 AM Pager:  [phone]

## 2020-10-04 NOTE — Assessment & Plan Note (Signed)
Currently well controlled on high dose ICS/LABA. He did not tolerate Breztri d.t cough.   Continue Symbicort 160 two puffs twice daily and Singulair '10mg'$  at bedtime. He gets his medications through [place].

## 2020-10-04 NOTE — Assessment & Plan Note (Addendum)
-   Add dymista nasal spray 1 puff per nostril twice daily - He can try OTC antihistamine if symptoms persist

## 2020-10-07 ENCOUNTER — Other Ambulatory Visit: Payer: Self-pay

## 2020-10-07 ENCOUNTER — Encounter: Payer: Self-pay | Admitting: Physician Assistant

## 2020-10-07 ENCOUNTER — Ambulatory Visit (INDEPENDENT_AMBULATORY_CARE_PROVIDER_SITE_OTHER): Payer: Medicare Other | Admitting: Physician Assistant

## 2020-10-07 ENCOUNTER — Ambulatory Visit (INDEPENDENT_AMBULATORY_CARE_PROVIDER_SITE_OTHER): Payer: Medicare Other

## 2020-10-07 DIAGNOSIS — K219 Gastro-esophageal reflux disease without esophagitis: Secondary | ICD-10-CM | POA: Diagnosis not present

## 2020-10-07 DIAGNOSIS — Z96641 Presence of right artificial hip joint: Secondary | ICD-10-CM | POA: Diagnosis not present

## 2020-10-07 DIAGNOSIS — J4530 Mild persistent asthma, uncomplicated: Secondary | ICD-10-CM | POA: Diagnosis not present

## 2020-10-07 DIAGNOSIS — E0822 Diabetes mellitus due to underlying condition with diabetic chronic kidney disease: Secondary | ICD-10-CM | POA: Diagnosis not present

## 2020-10-07 DIAGNOSIS — I2510 Atherosclerotic heart disease of native coronary artery without angina pectoris: Secondary | ICD-10-CM | POA: Diagnosis not present

## 2020-10-07 DIAGNOSIS — I5030 Unspecified diastolic (congestive) heart failure: Secondary | ICD-10-CM | POA: Diagnosis not present

## 2020-10-07 DIAGNOSIS — J45909 Unspecified asthma, uncomplicated: Secondary | ICD-10-CM | POA: Diagnosis not present

## 2020-10-07 DIAGNOSIS — G4700 Insomnia, unspecified: Secondary | ICD-10-CM | POA: Diagnosis not present

## 2020-10-07 DIAGNOSIS — E119 Type 2 diabetes mellitus without complications: Secondary | ICD-10-CM | POA: Diagnosis not present

## 2020-10-07 DIAGNOSIS — N183 Chronic kidney disease, stage 3 unspecified: Secondary | ICD-10-CM | POA: Diagnosis not present

## 2020-10-07 DIAGNOSIS — I10 Essential (primary) hypertension: Secondary | ICD-10-CM | POA: Diagnosis not present

## 2020-10-07 DIAGNOSIS — I480 Paroxysmal atrial fibrillation: Secondary | ICD-10-CM | POA: Diagnosis not present

## 2020-10-07 DIAGNOSIS — E785 Hyperlipidemia, unspecified: Secondary | ICD-10-CM | POA: Diagnosis not present

## 2020-10-07 MED ORDER — LIDOCAINE HCL 1 % IJ SOLN
3.0000 mL | INTRAMUSCULAR | Status: AC | PRN
  Administered 2020-10-07: 3 mL

## 2020-10-07 MED ORDER — METHYLPREDNISOLONE ACETATE 40 MG/ML IJ SUSP
40.0000 mg | INTRAMUSCULAR | Status: AC | PRN
  Administered 2020-10-07: 40 mg via INTRA_ARTICULAR

## 2020-10-07 NOTE — Progress Notes (Signed)
Office Visit Note   Patient: Carlos Dawson           Date of Birth: 1941/01/19           MRN: [id number] Visit Date: 10/07/2020              Requested by: Josetta Huddle, MD [address] [place],  [place] [address] PCP: Josetta Huddle, MD   Assessment & Plan: Visit Diagnoses:  1. Status post total replacement of right hip     Plan: We will see him back in early November he did see what type of response he had to the check injection that is given today and also to the epidural steroid injection lumbar spine that he is due to receive in late September.  Questions were encouraged and answered at length today.  Neck step may be bone scan to evaluate for any loosening or increased activity around the right hip prosthesis.  Follow-Up Instructions: Return in about 2 months (around 12/07/2020).   Orders:  Orders Placed This Encounter  Procedures   Large Joint Inj   XR HIP UNILAT W OR W/O PELVIS 2-3 VIEWS RIGHT   NM Bone Scan 3 Phase   No orders of the defined types were placed in this encounter.     Procedures: Large Joint Inj: R greater trochanter on 10/07/2020 5:40 PM Indications: pain Details: 22 G 1.5 in needle, lateral approach  Arthrogram: No  Medications: 3 mL lidocaine 1 %; 40 mg methylPREDNISolone acetate 40 MG/ML Outcome: tolerated well, no immediate complications Procedure, treatment alternatives, risks and benefits explained, specific risks discussed. Consent was given by the patient. Immediately prior to procedure a time out was called to verify the correct patient, procedure, equipment, support staff and site/side marked as required. Patient was prepped and draped in the usual sterile fashion.      Clinical Data: No additional findings.   Subjective: Chief Complaint  Patient presents with   Right Hip - Pain    HPI Carlos Dawson 80 year old male well-known to our department service comes in today with right hip pain.  He status post right  total hip arthroplasty 10/18/2018.  States he has had pain since surgery.  He did have a fall 2 weeks after surgery.  He has some pain that radiates down the leg from the hip region to the knee following the IT band but occasionally has some numbness that goes down to his ankle at times.  He states he still unable to flex his right hip.  He has some groin pain.  He does feel that the hip feels as if his can give out on him at some point in time.  He reports that his neurologist has told him that his back is good and that he does not feel that his symptoms are coming from his back at this point in time.  However he is due to undergo an epidural steroid injection of his lumbar spine September 26.  He takes Celebrex sometimes but is on a blood thinner and should not be taking this.  He feels that his hip pain is getting worse.  He is using a cane to ambulate.  He is diabetic and reports good control of his diabetes with a hemoglobin A1c of around 6.7. Review of Systems Denies any fevers chills or ongoing infections.  Objective: Vital Signs: There were no vitals taken for this visit.  Physical Exam General well-developed well-nourished male in no acute distress.  Ambulates  with cane with antalgic gait. Ortho Exam Right hip good range of motion of the right hip with passive internal and external rotation.  Extremes of internal and external rotation cause pain.  He has tenderness over the right trochanteric region.  He has limited flexion of the hip actively.  There is no blocks with range of motion of the hip with flexion internal and external rotation passively. Specialty Comments:  No specialty comments available.  Imaging: XR HIP UNILAT W OR W/O PELVIS 2-3 VIEWS RIGHT  Result Date: 10/07/2020 Right hip and AP pelvis: AP pelvis shows both hips to be well located.  Status post right total hip arthroplasty with well-seated components.  No evidence of loosening.  Heterotopic bone seen about the greater  trochanter region of the right hip.  No acute fractures no bony abnormalities.  Lateral view of the right hip shows the hip to be well located no evidence of loosening.    PMFS History: Patient Active Problem List   Diagnosis Date Noted   Abnormal feces 05/27/2020   Acquired thrombophilia ([place]) 05/27/2020   Air embolism following infusion, transfusion and therapeutic injection, initial encounter 05/27/2020   Anxiety state 05/27/2020   Benign prostatic hyperplasia without lower urinary tract symptoms 05/27/2020   Cardiac arrhythmia 05/27/2020   Chronic kidney disease, stage 3 unspecified ([place]) 05/27/2020   Cutaneous abscess of limb, unspecified 05/27/2020   Deltoid tendinitis 05/27/2020   Diarrhea due to drug 05/27/2020   Enlarged prostate 05/27/2020   Functional diarrhea 05/27/2020   History of malignant neoplasm of prostate 05/27/2020   Impaired fasting glucose 05/27/2020   Insomnia 05/27/2020   Iron deficiency anemia 05/27/2020   Left lower quadrant pain 05/27/2020   Anemia 05/27/2020   Microcytic anemia 05/27/2020   Myalgia 05/27/2020   Nasal congestion 05/27/2020   Other ill-defined and unknown causes of morbidity and mortality 05/27/2020   Pain in joint, ankle and foot 05/27/2020   Personal history of colonic polyps 05/27/2020   Vitamin D deficiency 05/27/2020   Sacroiliac joint dysfunction 06/17/2019   Status post total replacement of right hip 10/18/2018   Unilateral primary osteoarthritis, right hip 03/26/2018   Hemoptysis 02/08/2018   Atrial fibrillation with rapid ventricular response ([place]) 04/15/2017   SOB (shortness of breath)    Sleep apnea    Prostate CA ([place])    Pneumonia    PAF (paroxysmal atrial fibrillation) (HCC)    Mild intermittent asthma    IBS (irritable bowel syndrome)    Hearing loss    GERD (gastroesophageal reflux disease)    Generalized headaches    Diastolic dysfunction    Contact lens/glasses fitting    Diabetes mellitus without  complication ([place])    Colonic polyp    Heart failure (HCC)    ASCVD (arteriosclerotic cardiovascular disease)    Ascending aortic aneurysm (HCC)    Aortic root enlargement (HCC)    S/P lumbar spinal fusion 10/18/2016   Polyneuropathy 09/18/2016   Chest pain 09/15/2016   Paroxysmal atrial fibrillation with RVR ([place]) 09/15/2016   Hypotension 09/15/2016   S/P lumbar laminectomy 05/04/2016   Oral candidiasis 03/30/2016   Pain in right hip 03/29/2016   Lumbar radiculopathy 03/29/2016   Post laminectomy syndrome 03/29/2016   Carpal tunnel syndrome 07/29/2015   Severe obesity (BMI >= 40) ([place]) 04/26/2015   Asthma with acute exacerbation 04/22/2015   Hx of pulmonary embolus 09/11/2014   CAP (community acquired pneumonia) 08/12/2013   Encounter for therapeutic drug monitoring 03/03/2013   Chest pain  at rest 01/06/2013   Chronic diastolic heart failure ([place]) 12/26/2012   HLD (hyperlipidemia) 12/26/2012   Allergic rhinitis 12/24/2012   Urge incontinence of urine 10/07/2012   Long term (current) use of anticoagulants 08/29/2012   Lapband APL May 2009 10/25/2011   Hypoxemia 12/08/2010   Dyspnea 12/08/2010   ED (erectile dysfunction) of organic origin 11/28/2010   Elevated prostate specific antigen (PSA) 11/28/2010   Hypertension    Obesities, morbid (HCC)    Pulmonary embolism (HCC)    Anticoagulant long-term use    CAD (coronary artery disease)    Thoracic aortic aneurysm (HCC)    OA (osteoarthritis)    LVH (left ventricular hypertrophy)    Paroxysmal atrial fibrillation (HCC)    Mild persistent asthma, well controlled 10/21/2008   OSA (obstructive sleep apnea) 08/06/2008   Past Medical History:  Diagnosis Date   Anticoagulant long-term use    Aortic root enlargement ([place])    Ascending aortic aneurysm ([place])    recent scan in October 2012 showing no change; followed by Dr. Servando Snare   ASCVD (arteriosclerotic cardiovascular disease)    Prior BMS to the 2nd OM in September 2012;  with repeat cath in October showing patency   CAD (coronary artery disease)    a. s/p BMS to 2nd OM in Sept 2012; b. LexiScan Myoview (12/2012):  Inf infarct; bowel and motion artifact make study difficult to interpret; no ischemia; not gated; Low Risk   CHF (congestive heart failure) (HCC)    no recent issues 10/13/14   Chronic back pain    "all over my back" (05/11/2017)   Colonic polyp    Contact lens/glasses fitting    Diastolic dysfunction    DVT (deep venous thrombosis) ([place])    ?LLE   Frequent headaches    "probably weekly" (05/11/2017)   Generalized headaches    neck stenosis   GERD (gastroesophageal reflux disease)    Hearing loss    Hearing loss    more so on left   Hemorrhoids    History of stomach ulcers    Hypertension    IBS (irritable bowel syndrome)    LVH (left ventricular hypertrophy)    Mild intermittent asthma    OA (osteoarthritis)    "all over" (05/11/2017)   Obesities, morbid (HCC)    OSA (obstructive sleep apnea)    PSG 03/30/97 AHI 21, BPAP 13/9   OSA on CPAP    PAF (paroxysmal atrial fibrillation) ([place])    a. on Xarelto b. s/p DCCV in 08/2016; b. Tikosyn failed 04/16/17 with plans for Multaq and possible Afib ablation with Dr. Rayann Heman   Pneumonia    'several times" (05/11/2017)   Prostate CA [facility])    Oncologist  DR. Daral[facility] 09/24/[facility], undetermined tx   prostate; S/P "radiation and hormone injections"   Pulmonary embolism ([place]) 2008   "both lungs"   SOB (shortness of breath)    on excertion   Thoracic aortic aneurysm (HCC)    Aortic Size Index=     5.0    /Body surface area is 2.43 meters squared. = 2.05  < 2.75 cm/m2      4% risk per year 2.75 to 4.25          8% risk per year > 4.25 cm/m2    20% risk per year   Stable aneurysmal dilation of the ascending aorta with maximum AP diameter of 4.8 cm. Stable area of narrowing of the proximal most portion of the descending aorta  measuring 2 cm., previously identified as an area of coarctation. No  evidence of aortic dissection.  Coronary artery disease.  Normal appearance of the lungs.   Electronically Signed   By: Fidela [place] M.D.   On: 10/01/2014 08:50     Type II diabetes mellitus ([place])    metphormin, average 154 dx 2017    Family History  Problem Relation Age of Onset   Heart disease Mother    Diabetes Mother    Other Mother        stent placement   Emphysema Father 6   Heart attack Sister     Past Surgical History:  Procedure Laterality Date   ACHILLES TENDON REPAIR Bilateral    AORTIC ARCH ANGIOGRAPHY N/A 03/13/2017   Procedure: AORTIC ARCH ANGIOGRAPHY;  Surgeon: Martinique, [name], MD;  Location: [place] [facility];  Service: Cardiovascular;  Laterality: N/A;   APPENDECTOMY     ATRIAL FIBRILLATION ABLATION  05/11/2017   ATRIAL FIBRILLATION ABLATION N/A 05/11/2017   Procedure: ATRIAL FIBRILLATION ABLATION;  Surgeon: [name], MD;  Location: [place] [facility];  Service: Cardiovascular;  Laterality: N/A;   BACK SURGERY     "I've had 7 back and 1 neck ORs" (05/11/2017)   BIOPSY  03/14/2018   Procedure: BIOPSY;  Surgeon: Ronnette Juniper, MD;  Location: [facility];  Service: Gastroenterology;;  EGD and Colon   CARDIAC CATHETERIZATION  2006   CARPAL TUNNEL RELEASE Bilateral    LEFT   CATARACT EXTRACTION W/ INTRAOCULAR LENS  IMPLANT, BILATERAL Bilateral    CERVICAL SPINE SURGERY  06/02/2010   lower back and neck   COLONOSCOPY N/A 03/14/2018   Procedure: COLONOSCOPY;  Surgeon: Ronnette Juniper, MD;  Location: [facility];  Service: Gastroenterology;  Laterality: N/A;   COLONOSCOPY WITH PROPOFOL N/A 12/29/2014   Procedure: COLONOSCOPY WITH PROPOFOL;  Surgeon: Garlan Fair, MD;  Location: [facility];  Service: Endoscopy;  Laterality: N/A;   CORONARY ANGIOPLASTY WITH STENT PLACEMENT  October 2012   CORONARY STENT PLACEMENT  Sept 2012   2nd OM with BMS   ESOPHAGOGASTRODUODENOSCOPY N/A 03/14/2018   Procedure: ESOPHAGOGASTRODUODENOSCOPY (EGD);  Surgeon: Ronnette Juniper, MD;   Location: [facility];  Service: Gastroenterology;  Laterality: N/A;   HEMORROIDECTOMY     LAMINECTOMY  05/30/2012   L 4 L5   LAMINECTOMY WITH POSTERIOR LATERAL ARTHRODESIS LEVEL 3 N/A 10/18/2016   Procedure: Posterior Lateral Fusion - Lumbar One-Four, segmental instrumentation Lumbar One-Five,  decompression,;  Surgeon: Eustace Moore, MD;  Location: [place];  Service: Neurosurgery;  Laterality: N/A;   LAPAROSCOPIC CHOLECYSTECTOMY     LAPAROSCOPIC GASTRIC BANDING     LEFT AND RIGHT HEART CATHETERIZATION WITH CORONARY ANGIOGRAM N/A 05/07/2014   Procedure: LEFT AND RIGHT HEART CATHETERIZATION WITH CORONARY ANGIOGRAM;  Surgeon: [name] Martinique, MD;  Location: [facility];  Service: Cardiovascular;  Laterality: N/A;   LEFT HEART CATH AND CORONARY ANGIOGRAPHY N/A 03/13/2017   Procedure: LEFT HEART CATH AND CORONARY ANGIOGRAPHY;  Surgeon: Martinique, [name], MD;  Location: [place] CV [facility];  Service: Cardiovascular;  Laterality: N/A;   LUMBAR LAMINECTOMY/DECOMPRESSION MICRODISCECTOMY N/A 05/04/2016   Procedure: Laminectomy and Foraminotomy - Thoracic twelve-Lumbar one -Posterior Fusion Lumbar one-two;  Surgeon: Eustace Moore, MD;  Location: [place];  Service: Neurosurgery;  Laterality: N/A;   POLYPECTOMY  03/14/2018   Procedure: POLYPECTOMY;  Surgeon: Ronnette Juniper, MD;  Location: [facility];  Service: Gastroenterology;;   POSTERIOR LUMBAR FUSION  10/18/2016   SHOULDER OPEN ROTATOR CUFF REPAIR  Bilateral    TONSILLECTOMY AND ADENOIDECTOMY     TOTAL HIP ARTHROPLASTY Right 10/18/2018   Procedure: RIGHT TOTAL HIP ARTHROPLASTY ANTERIOR APPROACH;  Surgeon: Mcarthur Rossetti, MD;  Location: [facility];  Service: Orthopedics;  Laterality: Right;   TRIGGER FINGER RELEASE     LEFT   UVULOPALATOPHARYNGOPLASTY     VASECTOMY     Social History   Occupational History   Occupation: Retired from Scientist, clinical (histocompatibility and immunogenetics): RETIRED  Tobacco Use   Smoking status: Former    Packs/day: 1.50    Years: 30.00    Pack years:  45.00    Types: Cigarettes    Start date: 1958    Quit date: 01/24/1992    Years since quitting: 28.7   Smokeless tobacco: Never  Vaping Use   Vaping Use: Never used  Substance and Sexual Activity   Alcohol use: No   Drug use: No   Sexual activity: Not Currently

## 2020-10-18 DIAGNOSIS — M461 Sacroiliitis, not elsewhere classified: Secondary | ICD-10-CM | POA: Diagnosis not present

## 2020-10-20 ENCOUNTER — Encounter (HOSPITAL_COMMUNITY)
Admission: RE | Admit: 2020-10-20 | Discharge: 2020-10-20 | Disposition: A | Discharge status: Home / Self Care | Payer: Medicare Other | Source: Ambulatory Visit | Attending: Physician Assistant | Admitting: Physician Assistant

## 2020-10-20 ENCOUNTER — Other Ambulatory Visit: Payer: Self-pay

## 2020-10-20 DIAGNOSIS — Z96641 Presence of right artificial hip joint: Secondary | ICD-10-CM | POA: Diagnosis not present

## 2020-10-20 MED ORDER — TECHNETIUM TC 99M MEDRONATE IV KIT
20.1000 | PACK | Freq: Once | INTRAVENOUS | Status: AC
  Administered 2020-10-20: 20.1 via INTRAVENOUS

## 2020-10-21 DIAGNOSIS — Z96641 Presence of right artificial hip joint: Secondary | ICD-10-CM | POA: Diagnosis not present

## 2020-10-21 DIAGNOSIS — M25551 Pain in right hip: Secondary | ICD-10-CM | POA: Diagnosis not present

## 2020-10-27 ENCOUNTER — Other Ambulatory Visit: Payer: Self-pay | Admitting: *Deleted

## 2020-10-27 DIAGNOSIS — I712 Thoracic aortic aneurysm, without rupture, unspecified: Secondary | ICD-10-CM

## 2020-10-28 DIAGNOSIS — K219 Gastro-esophageal reflux disease without esophagitis: Secondary | ICD-10-CM | POA: Diagnosis not present

## 2020-10-28 DIAGNOSIS — I10 Essential (primary) hypertension: Secondary | ICD-10-CM | POA: Diagnosis not present

## 2020-10-28 DIAGNOSIS — I4891 Unspecified atrial fibrillation: Secondary | ICD-10-CM | POA: Diagnosis not present

## 2020-10-28 DIAGNOSIS — E119 Type 2 diabetes mellitus without complications: Secondary | ICD-10-CM | POA: Diagnosis not present

## 2020-10-28 DIAGNOSIS — E785 Hyperlipidemia, unspecified: Secondary | ICD-10-CM | POA: Diagnosis not present

## 2020-10-28 DIAGNOSIS — J45901 Unspecified asthma with (acute) exacerbation: Secondary | ICD-10-CM | POA: Diagnosis not present

## 2020-10-28 DIAGNOSIS — N183 Chronic kidney disease, stage 3 unspecified: Secondary | ICD-10-CM | POA: Diagnosis not present

## 2020-10-28 DIAGNOSIS — D509 Iron deficiency anemia, unspecified: Secondary | ICD-10-CM | POA: Diagnosis not present

## 2020-10-28 DIAGNOSIS — D6869 Other thrombophilia: Secondary | ICD-10-CM | POA: Diagnosis not present

## 2020-10-28 DIAGNOSIS — C61 Malignant neoplasm of prostate: Secondary | ICD-10-CM | POA: Diagnosis not present

## 2020-10-28 DIAGNOSIS — I2510 Atherosclerotic heart disease of native coronary artery without angina pectoris: Secondary | ICD-10-CM | POA: Diagnosis not present

## 2020-10-28 DIAGNOSIS — Z1389 Encounter for screening for other disorder: Secondary | ICD-10-CM | POA: Diagnosis not present

## 2020-10-28 DIAGNOSIS — Z0000 Encounter for general adult medical examination without abnormal findings: Secondary | ICD-10-CM | POA: Diagnosis not present

## 2020-10-28 DIAGNOSIS — I509 Heart failure, unspecified: Secondary | ICD-10-CM | POA: Diagnosis not present

## 2020-11-01 ENCOUNTER — Ambulatory Visit (INDEPENDENT_AMBULATORY_CARE_PROVIDER_SITE_OTHER): Payer: Medicare Other | Admitting: Physician Assistant

## 2020-11-01 ENCOUNTER — Encounter: Payer: Self-pay | Admitting: Physician Assistant

## 2020-11-01 ENCOUNTER — Other Ambulatory Visit: Payer: Self-pay

## 2020-11-01 DIAGNOSIS — Z7409 Other reduced mobility: Secondary | ICD-10-CM

## 2020-11-01 DIAGNOSIS — M5416 Radiculopathy, lumbar region: Secondary | ICD-10-CM

## 2020-11-01 DIAGNOSIS — I2510 Atherosclerotic heart disease of native coronary artery without angina pectoris: Secondary | ICD-10-CM

## 2020-11-01 NOTE — Progress Notes (Signed)
Office Visit Note   Patient: Carlos Dawson           Date of Birth: 09-29-1940           MRN: [id number] Visit Date: 11/01/2020              Requested by: Josetta Huddle, MD [address] [place],  [place] [id number] PCP: Josetta Huddle, MD   Assessment & Plan: Visit Diagnoses:  1. Impaired functional mobility, balance, gait, and endurance   2. Lumbar radiculopathy     Plan: Given patient's continued low back pain with radicular symptoms down the right leg which responded well to an epidural steroid injection we will refer patient back to Dr. Ronnald Ramp for evaluation of his back is possible source of his pain.  In regards to the weakness in his gait and balance disturbance recommend physical therapy.  This week therapy for his back so include stretching, home exercise program, gait and balance training.  He will follow-up with [place] as needed.  Questions encouraged and answered  Follow-Up Instructions: Return if symptoms worsen or fail to improve.   Orders:  No orders of the defined types were placed in this encounter.  No orders of the defined types were placed in this encounter.     Procedures: No procedures performed   Clinical Data: No additional findings.   Subjective: Chief Complaint  Patient presents with   Right Hip - Follow-up    HPI Carlos Dawson returns today to go over the 3 phase bone scan.  This showed no evidence of loosening or infection involving the left or right hip arthroplasty components.  Patient continues to complain of right leg pain in the right leg gives way is concerned about possibly falling.  He does report he underwent epidural steroid injection and that he had much improved pain for approximately 3 to 4 days.  States the trochanteric injection on the right gave him no relief. Review of Systems See HPI  Objective: Vital Signs: There were no vitals taken for this visit.  Physical Exam General: Well-developed well-nourished male no  acute distress Ortho Exam Right hip good range of motion without pain.  Tenderness over the right IT band.  Limited flexion right hip full range of motion left hip without pain.  Tenderness over the right lower lumbar paraspinous region with palpation. Specialty Comments:  No specialty comments available.  Imaging: No results found.   PMFS History: Patient Active Problem List   Diagnosis Date Noted   Abnormal feces 05/27/2020   Acquired thrombophilia ([place]) 05/27/2020   Air embolism following infusion, transfusion and therapeutic injection, initial encounter 05/27/2020   Anxiety state 05/27/2020   Benign prostatic hyperplasia without lower urinary tract symptoms 05/27/2020   Cardiac arrhythmia 05/27/2020   Chronic kidney disease, stage 3 unspecified ([place]) 05/27/2020   Cutaneous abscess of limb, unspecified 05/27/2020   Deltoid tendinitis 05/27/2020   Diarrhea due to drug 05/27/2020   Enlarged prostate 05/27/2020   Functional diarrhea 05/27/2020   History of malignant neoplasm of prostate 05/27/2020   Impaired fasting glucose 05/27/2020   Insomnia 05/27/2020   Iron deficiency anemia 05/27/2020   Left lower quadrant pain 05/27/2020   Anemia 05/27/2020   Microcytic anemia 05/27/2020   Myalgia 05/27/2020   Nasal congestion 05/27/2020   Other ill-defined and unknown causes of morbidity and mortality 05/27/2020   Pain in joint, ankle and foot 05/27/2020   Personal history of colonic polyps 05/27/2020   Vitamin D deficiency  05/27/2020   Sacroiliac joint dysfunction 06/17/2019   Status post total replacement of right hip 10/18/2018   Unilateral primary osteoarthritis, right hip 03/26/2018   Hemoptysis 02/08/2018   Atrial fibrillation with rapid ventricular response ([place]) 04/15/2017   SOB (shortness of breath)    Sleep apnea    Prostate CA ([place])    Pneumonia    PAF (paroxysmal atrial fibrillation) (HCC)    Mild intermittent asthma    IBS (irritable bowel syndrome)    Hearing  loss    GERD (gastroesophageal reflux disease)    Generalized headaches    Diastolic dysfunction    Contact lens/glasses fitting    Diabetes mellitus without complication ([place])    Colonic polyp    Heart failure ([place])    ASCVD (arteriosclerotic cardiovascular disease)    Ascending aortic aneurysm    Aortic root enlargement (HCC)    S/P lumbar spinal fusion 10/18/2016   Polyneuropathy 09/18/2016   Chest pain 09/15/2016   Paroxysmal atrial fibrillation with RVR ([place]) 09/15/2016   Hypotension 09/15/2016   S/P lumbar laminectomy 05/04/2016   Oral candidiasis 03/30/2016   Pain in right hip 03/29/2016   Lumbar radiculopathy 03/29/2016   Post laminectomy syndrome 03/29/2016   Carpal tunnel syndrome 07/29/2015   Severe obesity (BMI >= 40) ([place]) 04/26/2015   Asthma with acute exacerbation 04/22/2015   Hx of pulmonary embolus 09/11/2014   CAP (community acquired pneumonia) 08/12/2013   Encounter for therapeutic drug monitoring 03/03/2013   Chest pain at rest 01/06/2013   Chronic diastolic heart failure ([place]) 12/26/2012   HLD (hyperlipidemia) 12/26/2012   Allergic rhinitis 12/24/2012   Urge incontinence of urine 10/07/2012   Long term (current) use of anticoagulants 08/29/2012   Lapband APL May 2009 10/25/2011   Hypoxemia 12/08/2010   Dyspnea 12/08/2010   ED (erectile dysfunction) of organic origin 11/28/2010   Elevated prostate specific antigen (PSA) 11/28/2010   Hypertension    Obesities, morbid ([place])    Pulmonary embolism (HCC)    Anticoagulant long-term use    CAD (coronary artery disease)    Thoracic aortic aneurysm    OA (osteoarthritis)    LVH (left ventricular hypertrophy)    Paroxysmal atrial fibrillation (HCC)    Mild persistent asthma, well controlled 10/21/2008   OSA (obstructive sleep apnea) 08/06/2008   Past Medical History:  Diagnosis Date   Anticoagulant long-term use    Aortic root enlargement (HCC)    Ascending aortic aneurysm    recent scan in October  2012 showing no change; followed by Dr. Servando Snare   ASCVD (arteriosclerotic cardiovascular disease)    Prior BMS to the 2nd OM in September 2012; with repeat cath in October showing patency   CAD (coronary artery disease)    a. s/p BMS to 2nd OM in Sept 2012; b. LexiScan Myoview (12/2012):  Inf infarct; bowel and motion artifact make study difficult to interpret; no ischemia; not gated; Low Risk   CHF (congestive heart failure) (HCC)    no recent issues 10/13/14   Chronic back pain    "all over my back" (05/11/2017)   Colonic polyp    Contact lens/glasses fitting    Diastolic dysfunction    DVT (deep venous thrombosis) ([place])    ?LLE   Frequent headaches    "probably weekly" (05/11/2017)   Generalized headaches    neck stenosis   GERD (gastroesophageal reflux disease)    Hearing loss    Hearing loss    more so on left   Hemorrhoids  History of stomach ulcers    Hypertension    IBS (irritable bowel syndrome)    LVH (left ventricular hypertrophy)    Mild intermittent asthma    OA (osteoarthritis)    "all over" (05/11/2017)   Obesities, morbid (HCC)    OSA (obstructive sleep apnea)    PSG 03/30/97 AHI 21, BPAP 13/9   OSA on CPAP    PAF (paroxysmal atrial fibrillation) ([place])    a. on Xarelto b. s/p DCCV in 08/2016; b. Tikosyn failed 04/16/17 with plans for Multaq and possible Afib ablation with Dr. Rayann Heman   Pneumonia    'several times" (05/11/2017)   Prostate CA [facility])    Oncologist  DR. Daralene Milch [facility] 09/24/14, undetermined tx   prostate; S/P "radiation and hormone injections"   Pulmonary embolism ([place]) 2008   "both lungs"   SOB (shortness of breath)    on excertion   Thoracic aortic aneurysm    Aortic Size Index=     5.0    /Body surface area is 2.43 meters squared. = 2.05  < 2.75 cm/m2      4% risk per year 2.75 to 4.25          8% risk per year > 4.25 cm/m2    20% risk per year   Stable aneurysmal dilation of the ascending aorta with maximum AP diameter of 4.8 cm. Stable area  of narrowing of the proximal most portion of the descending aorta measuring 2 cm., previously identified as an area of coarctation. No evidence of aortic dissection.  Coronary artery disease.  Normal appearance of the lungs.   Electronically Signed   By: Fidela Salisbury M.D.   On: 10/01/2014 08:50     Type II diabetes mellitus ([place])    metphormin, average 154 dx 2017    Family History  Problem Relation Age of Onset   Heart disease Mother    Diabetes Mother    Other Mother        stent placement   Emphysema Father 59   Heart attack Sister     Past Surgical History:  Procedure Laterality Date   ACHILLES TENDON REPAIR Bilateral    AORTIC ARCH ANGIOGRAPHY N/A 03/13/2017   Procedure: AORTIC ARCH ANGIOGRAPHY;  Surgeon: Martinique, [name], MD;  Location: [facility];  Service: Cardiovascular;  Laterality: N/A;   APPENDECTOMY     ATRIAL FIBRILLATION ABLATION  05/11/2017   ATRIAL FIBRILLATION ABLATION N/A 05/11/2017   Procedure: ATRIAL FIBRILLATION ABLATION;  Surgeon: [name], MD;  Location: [facility];  Service: Cardiovascular;  Laterality: N/A;   BACK SURGERY     "I've had 7 back and 1 neck ORs" (05/11/2017)   BIOPSY  03/14/2018   Procedure: BIOPSY;  Surgeon: Ronnette Juniper, MD;  Location: [facility];  Service: Gastroenterology;;  EGD and Colon   CARDIAC CATHETERIZATION  2006   CARPAL TUNNEL RELEASE Bilateral    LEFT   CATARACT EXTRACTION W/ INTRAOCULAR LENS  IMPLANT, BILATERAL Bilateral    CERVICAL SPINE SURGERY  06/02/2010   lower back and neck   COLONOSCOPY N/A 03/14/2018   Procedure: COLONOSCOPY;  Surgeon: Ronnette Juniper, MD;  Location: [facility];  Service: Gastroenterology;  Laterality: N/A;   COLONOSCOPY WITH PROPOFOL N/A 12/29/2014   Procedure: COLONOSCOPY WITH PROPOFOL;  Surgeon: Garlan Fair, MD;  Location: [facility];  Service: Endoscopy;  Laterality: N/A;   CORONARY ANGIOPLASTY WITH STENT PLACEMENT  October 2012   CORONARY STENT PLACEMENT  Sept 2012  2nd  OM with BMS   ESOPHAGOGASTRODUODENOSCOPY N/A 03/14/2018   Procedure: ESOPHAGOGASTRODUODENOSCOPY (EGD);  Surgeon: Ronnette Juniper, MD;  Location: [facility];  Service: Gastroenterology;  Laterality: N/A;   HEMORROIDECTOMY     LAMINECTOMY  05/30/2012   L 4 L5   LAMINECTOMY WITH POSTERIOR LATERAL ARTHRODESIS LEVEL 3 N/A 10/18/2016   Procedure: Posterior Lateral Fusion - Lumbar One-Four, segmental instrumentation Lumbar One-Five,  decompression,;  Surgeon: Eustace Moore, MD;  Location: [facility];  Service: Neurosurgery;  Laterality: N/A;   LAPAROSCOPIC CHOLECYSTECTOMY     LAPAROSCOPIC GASTRIC BANDING     LEFT AND RIGHT HEART CATHETERIZATION WITH CORONARY ANGIOGRAM N/A 05/07/2014   Procedure: LEFT AND RIGHT HEART CATHETERIZATION WITH CORONARY ANGIOGRAM;  Surgeon: [name] Martinique, MD;  Location: [facility];  Service: Cardiovascular;  Laterality: N/A;   LEFT HEART CATH AND CORONARY ANGIOGRAPHY N/A 03/13/2017   Procedure: LEFT HEART CATH AND CORONARY ANGIOGRAPHY;  Surgeon: Martinique, [name], MD;  Location: [facility];  Service: Cardiovascular;  Laterality: N/A;   LUMBAR LAMINECTOMY/DECOMPRESSION MICRODISCECTOMY N/A 05/04/2016   Procedure: Laminectomy and Foraminotomy - Thoracic twelve-Lumbar one -Posterior Fusion Lumbar one-two;  Surgeon: Eustace Moore, MD;  Location: [facility];  Service: Neurosurgery;  Laterality: N/A;   POLYPECTOMY  03/14/2018   Procedure: POLYPECTOMY;  Surgeon: Ronnette Juniper, MD;  [facility];  Service: Gastroenterology;;   POSTERIOR LUMBAR FUSION  10/18/2016   SHOULDER OPEN ROTATOR CUFF REPAIR Bilateral    TONSILLECTOMY AND ADENOIDECTOMY     TOTAL HIP ARTHROPLASTY Right 10/18/2018   Procedure: RIGHT TOTAL HIP ARTHROPLASTY ANTERIOR APPROACH;  Surgeon: Mcarthur Rossetti, MD;  [facility]: [facility];  Service: Orthopedics;  Laterality: Right;   TRIGGER FINGER RELEASE     LEFT   UVULOPALATOPHARYNGOPLASTY     VASECTOMY     Social History   Occupational History   Occupation:  Retired from Scientist, clinical (histocompatibility and immunogenetics): RETIRED  Tobacco Use   Smoking status: Former    Packs/day: 1.50    Years: 30.00    Pack years: 45.00    Types: Cigarettes    Start date: 1958    Quit date: 01/24/1992    Years since quitting: 28.7   Smokeless tobacco: Never  Vaping Use   Vaping Use: Never used  Substance and Sexual Activity   Alcohol use: No   Drug use: No   Sexual activity: Not Currently

## 2020-11-02 NOTE — Addendum Note (Signed)
Addended by: Robyne Peers on: 11/02/2020 08:58 AM   Modules accepted: Orders

## 2020-11-11 ENCOUNTER — Other Ambulatory Visit: Payer: Self-pay | Admitting: Student

## 2020-11-11 DIAGNOSIS — M5416 Radiculopathy, lumbar region: Secondary | ICD-10-CM | POA: Diagnosis not present

## 2020-11-12 DIAGNOSIS — G43909 Migraine, unspecified, not intractable, without status migrainosus: Secondary | ICD-10-CM | POA: Diagnosis not present

## 2020-11-12 DIAGNOSIS — Z6839 Body mass index (BMI) 39.0-39.9, adult: Secondary | ICD-10-CM | POA: Diagnosis not present

## 2020-11-12 DIAGNOSIS — M461 Sacroiliitis, not elsewhere classified: Secondary | ICD-10-CM | POA: Diagnosis not present

## 2020-11-12 DIAGNOSIS — M5416 Radiculopathy, lumbar region: Secondary | ICD-10-CM | POA: Diagnosis not present

## 2020-11-15 DIAGNOSIS — I509 Heart failure, unspecified: Secondary | ICD-10-CM | POA: Diagnosis not present

## 2020-11-15 DIAGNOSIS — N1831 Chronic kidney disease, stage 3a: Secondary | ICD-10-CM | POA: Diagnosis not present

## 2020-11-15 DIAGNOSIS — R490 Dysphonia: Secondary | ICD-10-CM | POA: Diagnosis not present

## 2020-11-15 DIAGNOSIS — B370 Candidal stomatitis: Secondary | ICD-10-CM | POA: Diagnosis not present

## 2020-11-15 DIAGNOSIS — D509 Iron deficiency anemia, unspecified: Secondary | ICD-10-CM | POA: Diagnosis not present

## 2020-11-15 DIAGNOSIS — Z7984 Long term (current) use of oral hypoglycemic drugs: Secondary | ICD-10-CM | POA: Diagnosis not present

## 2020-11-15 DIAGNOSIS — E0822 Diabetes mellitus due to underlying condition with diabetic chronic kidney disease: Secondary | ICD-10-CM | POA: Diagnosis not present

## 2020-11-15 DIAGNOSIS — J45901 Unspecified asthma with (acute) exacerbation: Secondary | ICD-10-CM | POA: Diagnosis not present

## 2020-11-15 DIAGNOSIS — E1165 Type 2 diabetes mellitus with hyperglycemia: Secondary | ICD-10-CM | POA: Diagnosis not present

## 2020-11-15 DIAGNOSIS — I10 Essential (primary) hypertension: Secondary | ICD-10-CM | POA: Diagnosis not present

## 2020-11-15 DIAGNOSIS — I2510 Atherosclerotic heart disease of native coronary artery without angina pectoris: Secondary | ICD-10-CM | POA: Diagnosis not present

## 2020-11-15 DIAGNOSIS — E785 Hyperlipidemia, unspecified: Secondary | ICD-10-CM | POA: Diagnosis not present

## 2020-11-24 DIAGNOSIS — L814 Other melanin hyperpigmentation: Secondary | ICD-10-CM | POA: Diagnosis not present

## 2020-11-24 DIAGNOSIS — L218 Other seborrheic dermatitis: Secondary | ICD-10-CM | POA: Diagnosis not present

## 2020-11-24 DIAGNOSIS — L821 Other seborrheic keratosis: Secondary | ICD-10-CM | POA: Diagnosis not present

## 2020-11-24 DIAGNOSIS — S50812A Abrasion of left forearm, initial encounter: Secondary | ICD-10-CM | POA: Diagnosis not present

## 2020-12-01 ENCOUNTER — Ambulatory Visit
Admission: RE | Admit: 2020-12-01 | Discharge: 2020-12-01 | Disposition: A | Discharge status: Home / Self Care | Payer: Medicare Other | Source: Ambulatory Visit | Attending: Student | Admitting: Student

## 2020-12-01 DIAGNOSIS — M5416 Radiculopathy, lumbar region: Secondary | ICD-10-CM

## 2020-12-01 DIAGNOSIS — M4727 Other spondylosis with radiculopathy, lumbosacral region: Secondary | ICD-10-CM | POA: Diagnosis not present

## 2020-12-01 DIAGNOSIS — M5116 Intervertebral disc disorders with radiculopathy, lumbar region: Secondary | ICD-10-CM | POA: Diagnosis not present

## 2020-12-01 DIAGNOSIS — M5115 Intervertebral disc disorders with radiculopathy, thoracolumbar region: Secondary | ICD-10-CM | POA: Diagnosis not present

## 2020-12-01 DIAGNOSIS — M48061 Spinal stenosis, lumbar region without neurogenic claudication: Secondary | ICD-10-CM | POA: Diagnosis not present

## 2020-12-01 MED ORDER — GADOBENATE DIMEGLUMINE 529 MG/ML IV SOLN
20.0000 mL | Freq: Once | INTRAVENOUS | Status: AC | PRN
  Administered 2020-12-01: 20 mL via INTRAVENOUS

## 2020-12-02 DIAGNOSIS — Z6839 Body mass index (BMI) 39.0-39.9, adult: Secondary | ICD-10-CM | POA: Diagnosis not present

## 2020-12-02 DIAGNOSIS — R29898 Other symptoms and signs involving the musculoskeletal system: Secondary | ICD-10-CM | POA: Diagnosis not present

## 2020-12-03 ENCOUNTER — Other Ambulatory Visit: Payer: Self-pay | Admitting: Student

## 2020-12-03 DIAGNOSIS — R29898 Other symptoms and signs involving the musculoskeletal system: Secondary | ICD-10-CM

## 2020-12-09 ENCOUNTER — Ambulatory Visit: Payer: Medicare Other | Admitting: Physician Assistant

## 2020-12-09 DIAGNOSIS — R972 Elevated prostate specific antigen [PSA]: Secondary | ICD-10-CM | POA: Diagnosis not present

## 2020-12-09 DIAGNOSIS — N529 Male erectile dysfunction, unspecified: Secondary | ICD-10-CM | POA: Diagnosis not present

## 2020-12-09 DIAGNOSIS — C61 Malignant neoplasm of prostate: Secondary | ICD-10-CM | POA: Diagnosis not present

## 2020-12-22 ENCOUNTER — Encounter: Payer: Self-pay | Admitting: Surgery

## 2020-12-22 ENCOUNTER — Ambulatory Visit
Admission: RE | Admit: 2020-12-22 | Discharge: 2020-12-22 | Disposition: A | Discharge status: Home / Self Care | Payer: Medicare Other | Source: Ambulatory Visit | Attending: Surgery | Admitting: Surgery

## 2020-12-22 ENCOUNTER — Ambulatory Visit (INDEPENDENT_AMBULATORY_CARE_PROVIDER_SITE_OTHER): Payer: Medicare Other | Admitting: Surgery

## 2020-12-22 ENCOUNTER — Other Ambulatory Visit: Payer: Self-pay

## 2020-12-22 VITALS — BP 105/68 | HR 60 | Resp 20 | Ht 67.0 in | Wt 255.0 lb

## 2020-12-22 DIAGNOSIS — I700 Atherosclerosis of aorta: Secondary | ICD-10-CM | POA: Diagnosis not present

## 2020-12-22 DIAGNOSIS — R911 Solitary pulmonary nodule: Secondary | ICD-10-CM | POA: Diagnosis not present

## 2020-12-22 DIAGNOSIS — I2510 Atherosclerotic heart disease of native coronary artery without angina pectoris: Secondary | ICD-10-CM

## 2020-12-22 DIAGNOSIS — I712 Thoracic aortic aneurysm, without rupture, unspecified: Secondary | ICD-10-CM

## 2020-12-22 MED ORDER — IOPAMIDOL (ISOVUE-370) INJECTION 76%
75.0000 mL | Freq: Once | INTRAVENOUS | Status: AC | PRN
  Administered 2020-12-22: 75 mL via INTRAVENOUS

## 2020-12-22 NOTE — Progress Notes (Signed)
HPI:  The patient is a 80 year old gentleman who was being followed by Dr. Servando Snare since 2013 for an ascending aortic aneurysm measuring 5 cm which reportedly has been stable dating back to 2000.  He has a known history of coronary disease status post stenting of the left circumflex in the past.  He has severe underlying pulmonary disease.  There is limited mobility due to chronic back pain.  He has atrial fibrillation and is anticoagulated on Xarelto.  He has a history of prostate cancer [facility].  He denies any chest or back pain.  Current Outpatient Medications  Medication Sig Dispense Refill   acetaminophen (TYLENOL) 500 MG tablet Take 1,000 mg by mouth every 8 (eight) hours as needed for mild pain or headache.      albuterol (VENTOLIN HFA) 108 (90 Base) MCG/ACT inhaler Inhale 2 puffs into the lungs every 4 (four) hours as needed for wheezing or shortness of breath. 18 g 1   atorvastatin (LIPITOR) 10 MG tablet Take 5 mg by mouth daily at 6 PM.      augmented betamethasone dipropionate (DIPROLENE-AF) 0.05 % cream Apply 1 application topically 2 (two) times daily as needed for itching.  3   Azelastine-Fluticasone 137-50 MCG/ACT SUSP Place 1 puff into the nose in the morning and at bedtime. 23 g 5   benzonatate (TESSALON) 200 MG capsule Take 1 capsule (200 mg total) by mouth 3 (three) times daily as needed for cough. 30 capsule 1   budesonide-formoterol (SYMBICORT) 160-4.5 MCG/ACT inhaler Inhale 2 puffs into the lungs 2 (two) times daily.     celecoxib (CELEBREX) 200 MG capsule Take 200 mg by mouth 2 (two) times daily as needed for moderate pain.  0   Cholecalciferol (VITAMIN D-3) 5000 units TABS Take 5,000 Units by mouth daily.     Coenzyme Q10 100 MG capsule Take 100 mg by mouth daily.      JARDIANCE 25 MG TABS tablet Take 1 tablet by mouth daily.     KLOR-CON M20 20 MEQ tablet TAKE 2 TABLETS BY MOUTH TWICE A DAY 60 tablet 1   losartan (COZAAR) 25 MG tablet TAKE 1 TABLET BY  MOUTH EVERY DAY 90 tablet 3   methocarbamol (ROBAXIN) 500 MG tablet Take 1 tablet (500 mg total) by mouth every 8 (eight) hours as needed for muscle spasms. 60 tablet 1   metoprolol tartrate (LOPRESSOR) 25 MG tablet Take 12.5 mg by mouth 2 (two) times daily.     nitroGLYCERIN (NITROSTAT) 0.4 MG SL tablet Place 1 tablet (0.4 mg total) under the tongue every 5 (five) minutes as needed for chest pain (up to the tablets max. IF you are still have Chest pain after the 3rd tablet call 911.). 25 tablet 5   pantoprazole (PROTONIX) 20 MG tablet Take 20 mg by mouth daily.     Polyethyl Glycol-Propyl Glycol (SYSTANE OP) Place 1 drop into both eyes daily as needed (for dry eyes).      psyllium (METAMUCIL) 58.6 % packet Take 1 packet by mouth daily.     rivaroxaban (XARELTO) 20 MG TABS tablet Take 1 tablet (20 mg total) by mouth daily with supper. 30 tablet    sildenafil (VIAGRA) 100 MG tablet Take 1 tablet (100 mg total) by mouth as needed for erectile dysfunction. 10 tablet 6   Skin Protectants, Misc. (EUCERIN) cream Apply 1 application topically as needed for dry skin.     spironolactone (ALDACTONE) 25 MG tablet Take 25 mg  by mouth daily.     tamsulosin (FLOMAX) 0.4 MG CAPS Take 0.4 mg by mouth every evening.      topiramate (TOPAMAX) 25 MG capsule Take 50 mg by mouth 2 (two) times daily.      furosemide (LASIX) 40 MG tablet Take 1 tablet (40 mg total) by mouth daily. 90 tablet 3   No current facility-administered medications for this visit.     Physical Exam: BP 105/68   Pulse 60   Resp 20   Ht 5\' 7"  (1.702 m)   Wt 255 lb (115.7 kg)   SpO2 92% Comment: RA  BMI 39.94 kg/m  He looks well. Cardiac exam shows a regular rate and rhythm with normal heart sounds.  There is no murmur. Lungs are clear. There is no peripheral edema.  Diagnostic Tests:  Narrative & Impression  CLINICAL DATA:  Thoracic aortic aneurysm.   EXAM: CT ANGIOGRAPHY CHEST WITH CONTRAST   TECHNIQUE: Multidetector CT  imaging of the chest was performed using the standard protocol during bolus administration of intravenous contrast. Multiplanar CT image reconstructions and MIPs were obtained to evaluate the vascular anatomy.   CONTRAST:  59mL ISOVUE-370 IOPAMIDOL (ISOVUE-370) INJECTION 76%   COMPARISON:  February 19, 2020.   FINDINGS: Cardiovascular: Atherosclerosis of thoracic aorta is noted without dissection. Great vessels are widely patent without significant stenosis. Aortic root measures 5.5 cm in diameter. Ascending thoracic aorta has maximum measured diameter 4.9 cm. Proximal descending thoracic aorta measures 3.3 cm. Distal descending thoracic aorta measures 3.1 cm. Normal cardiac size. No pericardial effusion. Mild coronary artery calcifications are noted.   Mediastinum/Nodes: No enlarged mediastinal, hilar, or axillary lymph nodes. Thyroid gland, trachea, and esophagus demonstrate no significant findings.   Lungs/Pleura: No pneumothorax or pleural effusion is noted. Stable 4 mm nodule is noted in right middle lobe best seen on image number 93 of series 6. Minimal bibasilar subsegmental atelectasis is noted.   Upper Abdomen: No acute abnormality.   Musculoskeletal: No chest wall abnormality. No acute or significant osseous findings.   Review of the MIP images confirms the above findings.   IMPRESSION: 4.9 cm ascending thoracic aortic aneurysm is noted. Aortic root is aneurysmal measuring 5.5 cm in diameter. Recommend semi-annual imaging followup by CTA or MRA and referral to cardiothoracic surgery if not already obtained. This recommendation follows 2010 ACCF/AHA/AATS/ACR/ASA/SCA/SCAI/SIR/STS/SVM Guidelines for the Diagnosis and Management of Patients With Thoracic Aortic Disease. Circulation. 2010; 121: W098-J191. Aortic aneurysm NOS (ICD10-I71.9).   Mild coronary artery calcifications are noted.   Stable 4 mm nodule is noted in right middle lobe. Attention to  this abnormality on follow-up imaging is recommended.   Aortic Atherosclerosis (ICD10-I70.0).     Electronically Signed   By: Marijo Conception M.D.   On: 12/22/2020 08:49      Impression:  This 80 year old gentleman has a 5.5 cm aortic root aneurysm and 4.9 cm fusiform ascending aortic aneurysm which appears stable compared to his previous CT scan in January 2022.  Previous echocardiogram in 2019 had shown a normal trileaflet aortic valve without stenosis or insufficiency.  Given his age and comorbidities including morbid obesity and severe COPD I would recommend continued follow-up.  I have reviewed the CTA images with him and answered all of his questions.  I stressed the importance of continued good blood pressure control in preventing further enlargement and acute aortic dissection.  I advised him against doing any heavy lifting that may require a Valsalva maneuver.  I also advised him against  taking quinolone antibiotics which have been associated with enlargement of aortic aneurysms.  Plan:  I will plan to see him back in 6 months with a CTA of the chest/aorta with gating.  I spent 20 minutes performing this established patient evaluation and > 50% of this time was spent face to face counseling and coordinating the care of this patient's aortic aneurysm.    Gaye Pollack, MD [facility] [phone]

## 2020-12-28 ENCOUNTER — Other Ambulatory Visit: Payer: Self-pay

## 2020-12-28 ENCOUNTER — Ambulatory Visit
Admission: RE | Admit: 2020-12-28 | Discharge: 2020-12-28 | Disposition: A | Discharge status: Home / Self Care | Payer: Medicare Other | Source: Ambulatory Visit | Attending: Student | Admitting: Student

## 2020-12-28 DIAGNOSIS — R29898 Other symptoms and signs involving the musculoskeletal system: Secondary | ICD-10-CM

## 2020-12-28 DIAGNOSIS — M549 Dorsalgia, unspecified: Secondary | ICD-10-CM | POA: Diagnosis not present

## 2020-12-30 DIAGNOSIS — M461 Sacroiliitis, not elsewhere classified: Secondary | ICD-10-CM | POA: Diagnosis not present

## 2021-01-04 ENCOUNTER — Encounter (HOSPITAL_COMMUNITY): Payer: Self-pay | Admitting: Nurse Practitioner

## 2021-01-04 ENCOUNTER — Ambulatory Visit (HOSPITAL_COMMUNITY)
Admission: RE | Admit: 2021-01-04 | Discharge: 2021-01-04 | Disposition: A | Discharge status: Home / Self Care | Payer: Medicare Other | Source: Ambulatory Visit | Attending: Nurse Practitioner | Admitting: Nurse Practitioner

## 2021-01-04 ENCOUNTER — Other Ambulatory Visit: Payer: Self-pay

## 2021-01-04 VITALS — BP 110/50 | HR 57 | Ht 67.0 in | Wt 253.6 lb

## 2021-01-04 DIAGNOSIS — Z79899 Other long term (current) drug therapy: Secondary | ICD-10-CM | POA: Diagnosis not present

## 2021-01-04 DIAGNOSIS — D6869 Other thrombophilia: Secondary | ICD-10-CM

## 2021-01-04 DIAGNOSIS — I4819 Other persistent atrial fibrillation: Secondary | ICD-10-CM | POA: Insufficient documentation

## 2021-01-04 DIAGNOSIS — Z7901 Long term (current) use of anticoagulants: Secondary | ICD-10-CM | POA: Diagnosis not present

## 2021-01-04 DIAGNOSIS — I10 Essential (primary) hypertension: Secondary | ICD-10-CM | POA: Diagnosis not present

## 2021-01-04 DIAGNOSIS — I480 Paroxysmal atrial fibrillation: Secondary | ICD-10-CM | POA: Diagnosis not present

## 2021-01-04 MED ORDER — POTASSIUM CHLORIDE CRYS ER 20 MEQ PO TBCR: EXTENDED_RELEASE_TABLET | ORAL | Status: DC

## 2021-01-04 MED ORDER — FUROSEMIDE 80 MG PO TABS: 80.0000 mg | ORAL_TABLET | Freq: Every day | ORAL | Status: DC

## 2021-01-04 NOTE — Progress Notes (Signed)
Primary Care Physician: Josetta Huddle, MD Referring Physician: Dr. Rubie Maid is a 80 y.o. male with a h/o  persistent afib/flutter with afib ablation  in 04/2017. He has no  complaints today except for  some intermittent swelling of his feet. He reports that he is not compliant  with a low salt diet. "I like salt." He has been told he cat take extra laix as needed by Dr. Martinique. He hs not noted any irregular heart beat.  EKG today shows Sinus brady.   Today, he denies symptoms of palpitations, chest pain, shortness of breath, orthopnea, PND, lower extremity edema, dizziness, presyncope, syncope, or neurologic sequela. The patient is tolerating medications without difficulties and is otherwise without complaint today.   Past Medical History:  Diagnosis Date   Anticoagulant long-term use    Aortic root enlargement (HCC)    Ascending aortic aneurysm    recent scan in October 2012 showing no change; followed by Dr. Servando Snare   ASCVD (arteriosclerotic cardiovascular disease)    Prior BMS to the 2nd OM in September 2012; with repeat cath in October showing patency   CAD (coronary artery disease)    a. s/p BMS to 2nd OM in Sept 2012; b. LexiScan Myoview (12/2012):  Inf infarct; bowel and motion artifact make study difficult to interpret; no ischemia; not gated; Low Risk   CHF (congestive heart failure) (HCC)    no recent issues 10/13/14   Chronic back pain    "all over my back" (05/11/2017)   Colonic polyp    Contact lens/glasses fitting    Diastolic dysfunction    DVT (deep venous thrombosis) ([place])    ?LLE   Frequent headaches    "probably weekly" (05/11/2017)   Generalized headaches    neck stenosis   GERD (gastroesophageal reflux disease)    Hearing loss    Hearing loss    more so on left   Hemorrhoids    History of stomach ulcers    Hypertension    IBS (irritable bowel syndrome)    LVH (left ventricular hypertrophy)    Mild intermittent asthma    OA  (osteoarthritis)    "all over" (05/11/2017)   Obesities, morbid (HCC)    OSA (obstructive sleep apnea)    PSG 03/30/97 AHI 21, BPAP 13/9   OSA on CPAP    PAF (paroxysmal atrial fibrillation) ([place])    a. on Xarelto b. s/p DCCV in 08/2016; b. Tikosyn failed 04/16/17 with plans for Multaq and possible Afib ablation with Dr. Rayann Heman   Pneumonia    'several times" (05/11/2017)   Prostate CA [facility])    Oncologist  DR. Daralene Mil[facility] 09/24/14, undetermined tx   prostate; S/P "radiation and hormone injections"   Pulmonary embolism ([place]) 2008   "both lungs"   SOB (shortness of breath)    on excertion   Thoracic aortic aneurysm    Aortic Size Index=     5.0    /Body surface area is 2.43 meters squared. = 2.05  < 2.75 cm/m2      4% risk per year 2.75 to 4.25          8% risk per year > 4.25 cm/m2    20% risk per year   Stable aneurysmal dilation of the ascending aorta with maximum AP diameter of 4.8 cm. Stable area of narrowing of the proximal most portion of the descending aorta measuring 2 cm., previously identified as an area of coarctation. No  evidence of aortic dissection.  Coronary artery disease.  Normal appearance of the lungs.   Electronically Signed   By: Fidela Salisbury M.D.   On: 10/01/2014 08:50     Type II diabetes mellitus ([place])    metphormin, average 154 dx 2017   Past Surgical History:  Procedure Laterality Date   ACHILLES TENDON REPAIR Bilateral    AORTIC ARCH ANGIOGRAPHY N/A 03/13/2017   Procedure: AORTIC ARCH ANGIOGRAPHY;  Surgeon: Martinique, [name], MD;  Location: [place] [facility];  Service: Cardiovascular;  Laterality: N/A;   APPENDECTOMY     ATRIAL FIBRILLATION ABLATION  05/11/2017   ATRIAL FIBRILLATION ABLATION N/A 05/11/2017   Procedure: ATRIAL FIBRILLATION ABLATION;  Surgeon: [name], MD;  Location: [place] [facility];  Service: Cardiovascular;  Laterality: N/A;   BACK SURGERY     "I've had 7 back and 1 neck ORs" (05/11/2017)   BIOPSY  03/14/2018   Procedure:  BIOPSY;  Surgeon: Ronnette Juniper, MD;  Location: [facility];  Service: Gastroenterology;;  EGD and Colon   CARDIAC CATHETERIZATION  2006   CARPAL TUNNEL RELEASE Bilateral    LEFT   CATARACT EXTRACTION W/ INTRAOCULAR LENS  IMPLANT, BILATERAL Bilateral    CERVICAL SPINE SURGERY  06/02/2010   lower back and neck   COLONOSCOPY N/A 03/14/2018   Procedure: COLONOSCOPY;  Surgeon: Ronnette Juniper, MD;  Location: [facility];  Service: Gastroenterology;  Laterality: N/A;   COLONOSCOPY WITH PROPOFOL N/A 12/29/2014   Procedure: COLONOSCOPY WITH PROPOFOL;  Surgeon: Garlan Fair, MD;  Location: [facility];  Service: Endoscopy;  Laterality: N/A;   CORONARY ANGIOPLASTY WITH STENT PLACEMENT  October 2012   CORONARY STENT PLACEMENT  Sept 2012   2nd OM with BMS   ESOPHAGOGASTRODUODENOSCOPY N/A 03/14/2018   Procedure: ESOPHAGOGASTRODUODENOSCOPY (EGD);  Surgeon: Ronnette Juniper, MD;  Location: [facility];  Service: Gastroenterology;  Laterality: N/A;   HEMORROIDECTOMY     LAMINECTOMY  05/30/2012   L 4 L5   LAMINECTOMY WITH POSTERIOR LATERAL ARTHRODESIS LEVEL 3 N/A 10/18/2016   Procedure: Posterior Lateral Fusion - Lumbar One-Four, segmental instrumentation Lumbar One-Five,  decompression,;  Surgeon: Eustace Moore, MD;  Location: [place];  Service: Neurosurgery;  Laterality: N/A;   LAPAROSCOPIC CHOLECYSTECTOMY     LAPAROSCOPIC GASTRIC BANDING     LEFT AND RIGHT HEART CATHETERIZATION WITH CORONARY ANGIOGRAM N/A 05/07/2014   Procedure: LEFT AND RIGHT HEART CATHETERIZATION WITH CORONARY ANGIOGRAM;  Surgeon: [name] Martinique, MD;  Location: [facility];  Service: Cardiovascular;  Laterality: N/A;   LEFT HEART CATH AND CORONARY ANGIOGRAPHY N/A 03/13/2017   Procedure: LEFT HEART CATH AND CORONARY ANGIOGRAPHY;  Surgeon: Martinique, [name], MD;  Location: [place] [facility];  Service: Cardiovascular;  Laterality: N/A;   LUMBAR LAMINECTOMY/DECOMPRESSION MICRODISCECTOMY N/A 05/04/2016   Procedure: Laminectomy and Foraminotomy -  Thoracic twelve-Lumbar one -Posterior Fusion Lumbar one-two;  Surgeon: Eustace Moore, MD;  Location: [place];  Service: Neurosurgery;  Laterality: N/A;   POLYPECTOMY  03/14/2018   Procedure: POLYPECTOMY;  Surgeon: Ronnette Juniper, MD;  Location: [facility];  Service: Gastroenterology;;   POSTERIOR LUMBAR FUSION  10/18/2016   SHOULDER OPEN ROTATOR CUFF REPAIR Bilateral    TONSILLECTOMY AND ADENOIDECTOMY     TOTAL HIP ARTHROPLASTY Right 10/18/2018   Procedure: RIGHT TOTAL HIP ARTHROPLASTY ANTERIOR APPROACH;  Surgeon: Mcarthur Rossetti, MD;  Location: [facility];  Service: Orthopedics;  Laterality: Right;   TRIGGER FINGER RELEASE     LEFT   UVULOPALATOPHARYNGOPLASTY  VASECTOMY      Current Outpatient Medications  Medication Sig Dispense Refill   acetaminophen (TYLENOL) 500 MG tablet Take 1,000 mg by mouth every 8 (eight) hours as needed for mild pain or headache.      albuterol (VENTOLIN HFA) 108 (90 Base) MCG/ACT inhaler Inhale 2 puffs into the lungs every 4 (four) hours as needed for wheezing or shortness of breath. 18 g 1   atorvastatin (LIPITOR) 10 MG tablet Take 5 mg by mouth daily at 6 PM.      augmented betamethasone dipropionate (DIPROLENE-AF) 0.05 % cream Apply 1 application topically 2 (two) times daily as needed for itching.  3   Azelastine-Fluticasone 137-50 MCG/ACT SUSP Place 1 puff into the nose in the morning and at bedtime. 23 g 5   benzonatate (TESSALON) 200 MG capsule Take 1 capsule (200 mg total) by mouth 3 (three) times daily as needed for cough. 30 capsule 1   budesonide-formoterol (SYMBICORT) 160-4.5 MCG/ACT inhaler Inhale 2 puffs into the lungs 2 (two) times daily. As needed     celecoxib (CELEBREX) 200 MG capsule Take 200 mg by mouth 2 (two) times daily as needed for moderate pain.  0   Cholecalciferol (VITAMIN D-3) 5000 units TABS Take 5,000 Units by mouth daily.     Coenzyme Q10 100 MG capsule Take 100 mg by mouth daily.      JARDIANCE 25 MG TABS tablet Take 1 tablet  by mouth daily.     losartan (COZAAR) 25 MG tablet TAKE 1 TABLET BY MOUTH EVERY DAY 90 tablet 3   methocarbamol (ROBAXIN) 500 MG tablet Take 1 tablet (500 mg total) by mouth every 8 (eight) hours as needed for muscle spasms. 60 tablet 1   metoprolol tartrate (LOPRESSOR) 25 MG tablet Take 12.5 mg by mouth 2 (two) times daily.     nitroGLYCERIN (NITROSTAT) 0.4 MG SL tablet Place 1 tablet (0.4 mg total) under the tongue every 5 (five) minutes as needed for chest pain (up to the tablets max. IF you are still have Chest pain after the 3rd tablet call 911.). 25 tablet 5   pantoprazole (PROTONIX) 20 MG tablet Take 20 mg by mouth daily.     Polyethyl Glycol-Propyl Glycol (SYSTANE OP) Place 1 drop into both eyes daily as needed (for dry eyes).      psyllium (METAMUCIL) 58.6 % packet Take 1 packet by mouth daily.     rivaroxaban (XARELTO) 20 MG TABS tablet Take 1 tablet (20 mg total) by mouth daily with supper. 30 tablet    sildenafil (VIAGRA) 100 MG tablet Take 1 tablet (100 mg total) by mouth as needed for erectile dysfunction. 10 tablet 6   Skin Protectants, Misc. (EUCERIN) cream Apply 1 application topically as needed for dry skin.     spironolactone (ALDACTONE) 25 MG tablet Take 25 mg by mouth daily.     tamsulosin (FLOMAX) 0.4 MG CAPS Take 0.4 mg by mouth every evening.      topiramate (TOPAMAX) 25 MG capsule Take 50 mg by mouth 2 (two) times daily.      furosemide (LASIX) 80 MG tablet Take 1 tablet (80 mg total) by mouth daily.     potassium chloride SA (KLOR-CON M20) 20 MEQ tablet Taking two tablet by mouth in the am and 1 tablet in the evening     No current facility-administered medications for this encounter.    Allergies  Allergen Reactions   Other     Other reaction(s): cough, Other Other  reaction(s): cough  Other reaction(s): wt gain   Ace Inhibitors Cough   Adhesive [Tape] Itching and Rash   Amoxicillin-Pot Clavulanate Other (See Comments)    Other reaction(s): GI Upset  (intolerance)   Codeine Nausea Only    Other reaction(s): nausea Other reaction(s): nausea Other reaction(s): nausea    Latex Itching, Rash and Other (See Comments)    Bandaids   Metformin Diarrhea   Morphine Itching   Nifedipine Other (See Comments)    Other reaction(s): leg edema   Quinolones Other (See Comments)    Patient was warned about not using Cipro and similar antibiotics. Recent studies have raised concern that fluoroquinolone antibiotics could be associated with an increased risk of aortic aneurysm Fluoroquinolones have non-antimicrobial properties that might jeopardise the integrity of the extracellular matrix of the vascular wall In a  propensity score matched cohort study in [place], there was a 66% increased rate of aortic aneurysm or dissection associated with oral fluoroquinolone use, compared wit    Social History   Socioeconomic History   Marital status: Married    Spouse name: Not on file   Number of children: 3   Years of education: Not on file   Highest education level: Not on file  Occupational History   Occupation: Retired from Scientist, clinical (histocompatibility and immunogenetics): RETIRED  Tobacco Use   Smoking status: Former    Packs/day: 1.50    Years: 30.00    Pack years: 45.00    Types: Cigarettes    Start date: 1958    Quit date: 01/24/1992    Years since quitting: 28.9   Smokeless tobacco: Never  Vaping Use   Vaping Use: Never used  Substance and Sexual Activity   Alcohol use: No   Drug use: No   Sexual activity: Not Currently  Other Topics Concern   Not on file  Social History Narrative   Not on file   Social Determinants of Health   Financial Resource Strain: Not on file  Food Insecurity: Not on file  Transportation Needs: Not on file  Physical Activity: Not on file  Stress: Not on file  Social Connections: Not on file  Intimate Partner Violence: Not on file    Family History  Problem Relation Age of Onset   Heart disease Mother    Diabetes Mother    Other  Mother        stent placement   Emphysema Father 40   Heart attack Sister     ROS- All systems are reviewed and negative except as per the HPI above  Physical Exam: Vitals:   01/04/21 1024  BP: (!) 110/50  Pulse: (!) 57  Weight: 115 kg  Height: 5\' 7"  (1.702 m)   Wt Readings from Last 3 Encounters:  01/04/21 115 kg  12/22/20 115.7 kg  10/04/20 114.6 kg    Labs: Lab Results  Component Value Date   NA 138 11/24/2019   K 3.7 11/24/2019   CL 107 11/24/2019   CO2 22 11/24/2019   GLUCOSE 133 (H) 11/24/2019   BUN 25 (H) 11/24/2019   CREATININE 1.11 11/24/2019   CALCIUM 8.7 (L) 11/24/2019   MG 2.2 04/18/2017   Lab Results  Component Value Date   INR 1.3 (H) 03/08/2017   Lab Results  Component Value Date   CHOL 118 01/07/2013   HDL 40 01/07/2013   LDLCALC 51 01/07/2013   TRIG 133 01/07/2013     GEN- The patient is well appearing, alert and oriented  x 3 today.   Head- normocephalic, atraumatic Eyes-  Sclera clear, conjunctiva pink Ears- hearing intact Oropharynx- clear Neck- supple, no JVP Lymph- no cervical lymphadenopathy Lungs- Clear to ausculation bilaterally, normal work of breathing Heart- Regular rate and rhythm, no murmurs, rubs or gallops, PMI not laterally displaced GI- soft, NT, ND, + BS Extremities- no clubbing, cyanosis, or edema MS- no significant deformity or atrophy Skin- no rash or lesion Psych- euthymic mood, full affect Neuro- strength and sensation are intact  EKG-Sinus brady with sinus arrhythmia  with first degree AV block  at 266 ms, rate at 57 bpm  Epic records reviewed    Assessment and Plan: 1. Persistent  afib He appears to be maintaining SR   No noted afib  Continue  metoprolol tartrate at 12.5 mg bid   2. CHA2DS2VASc score of 6 Continue  xarelto 20 mg daily  3. HTN Stable No  change   F/u with afib clinic in 6 months   Butch Penny C. Brittania Sudbeck, [facility] [address] [place], [place]  [address] [phone]

## 2021-01-05 DIAGNOSIS — M461 Sacroiliitis, not elsewhere classified: Secondary | ICD-10-CM | POA: Diagnosis not present

## 2021-01-05 DIAGNOSIS — I10 Essential (primary) hypertension: Secondary | ICD-10-CM | POA: Diagnosis not present

## 2021-01-05 DIAGNOSIS — Z6839 Body mass index (BMI) 39.0-39.9, adult: Secondary | ICD-10-CM | POA: Diagnosis not present

## 2021-02-02 NOTE — Progress Notes (Signed)
Cardiology Office Note    Date:  02/16/2021   ID:  [name], DOB 22-Nov-1940, MRN [id number]  PCP:  Josetta Huddle, MD  Cardiologist: Dr. Kensli Bowley Martin[place]   Chief Complaint  Patient presents with   Coronary Artery Disease   Thoracic Aortic Aneurysm    History of Present Illness:    Carlos Dawson is a 81 y.o. male with past medical history of CAD (s/p BMS to 2nd OM in 2012, low-risk NST in 12/2012), Cath in 2019 with nonobstructive disease, PAF (on Xarelto), chronic diastolic CHF, prior PE, HTN, HLD, Type 2 DM, and known thoracic aortic aneurysm who is seen for follow up.  He was admitted from 8/23 - 09/15/2016 for evaluation of chest discomfort and palpitations. His initial EKG showed that he was in atrial fibrillation with RVR. He reported good compliance with his Xarelto and denied missing any doses, therefore a successful DCCV was performed. He became hypotensive with SBP in the 80's following the procedure, therefore he was admitted overnight for further observation. His Losartan, Spironolactone, and Lasix were held at the time of discharge with plans to resume at follow-up if BP allowed. A CTA was also obtained during admission to rule out a recurrent PE and was negative for a PE but did show a stable 5.0 cm ascending thoracic aortic aneurysm. Antihypertensive therapy was resumed.   On follow up in February 2019   he noted increased symptoms of dyspnea on exertion and chest pressure for 3 months. Some improvement with inhalers.  No increase in edema or weight.  Oxygen levels were good. He does use CPAP. Myoview study was mildly abnormal with inferoapical reversible defect. EF 61%. Echo showed normal EF with moderate pulmonary HTN. Subsequent cardiac cath showed no obstructive CAD and no AI. Unable to cross AV due to distortion of the aortic root.  In April 2019 he developed increased symptoms of arrhythmia despite Tikosyn. He underwent EPS by Dr. Rayann Heman on 05/11/17. He had Afib and  atrial flutter ablations. When seen in follow up  he had significant improvement in Afib symptoms and some improvement in dyspnea. Multaq was discontinued.   On 09/26/17 he had CPX to evaluate his dyspnea. This showed predominant limitation by obesity and deconditioning. He was seen by Dr. Halford Chessman who recommended repeat Echo and if pulmonary pressures still elevated to consider right heart cath. Of note right heart cath in 2016 demonstrated mild pulmonary HTN with elevated PCWP c/w diastolic dysfunction. Diuretic therapy was increased at that time without significant clinical improvement.  He underwent THR in September 2020. Post op course complicated by blood loss anemia. He did develop Afib with rates into the 120s. Since then follow up in the Afib clinic demonstrated return of NSR. Last seen in AFib clinic on 11/24/19 and in NSR with PACs.   Last seen in Afib clinic in December and was doing well.  On follow up today he notes that he had Covid last July; he recovered but just can't get rid of cough. Has tried a number of cough meds as well as 2 courses of antibiotics and steroids. Notes his current CPAP doesn't work as well as his old one did. Notes he gets SOB with almost any activity. Has some swelling. On lasix 80 mg daily and aldactone. BP has been OK.    Past Medical History:  Diagnosis Date   Anticoagulant long-term use    Aortic root enlargement (HCC)    Ascending aortic aneurysm    recent  scan in October 2012 showing no change; followed by Dr. Servando Snare   ASCVD (arteriosclerotic cardiovascular disease)    Prior BMS to the 2nd OM in September 2012; with repeat cath in October showing patency   CAD (coronary artery disease)    a. s/p BMS to 2nd OM in Sept 2012; b. LexiScan Myoview (12/2012):  Inf infarct; bowel and motion artifact make study difficult to interpret; no ischemia; not gated; Low Risk   CHF (congestive heart failure) (HCC)    no recent issues 10/13/14   Chronic back pain    "all  over my back" (05/11/2017)   Colonic polyp    Contact lens/glasses fitting    Diastolic dysfunction    DVT (deep venous thrombosis) ([place])    ?LLE   Frequent headaches    "probably weekly" (05/11/2017)   Generalized headaches    neck stenosis   GERD (gastroesophageal reflux disease)    Hearing loss    Hearing loss    more so on left   Hemorrhoids    History of stomach ulcers    Hypertension    IBS (irritable bowel syndrome)    LVH (left ventricular hypertrophy)    Mild intermittent asthma    OA (osteoarthritis)    "all over" (05/11/2017)   Obesities, morbid (HCC)    OSA (obstructive sleep apnea)    PSG 03/30/97 AHI 21, BPAP 13/9   OSA on CPAP    PAF (paroxysmal atrial fibrillation) ([place])    a. on Xarelto b. s/p DCCV in 08/2016; b. Tikosyn failed 04/16/17 with plans for Multaq and possible Afib ablation with Dr. Rayann Heman   Pneumonia    'several times" (05/11/2017)   Prostate CA [facility])    Oncologist  DR. Daralene [facility] dx 09/24/14, undetermined tx   prostate; S/P "radiation and hormone injections"   Pulmonary embolism ([place]) 2008   "both lungs"   SOB (shortness of breath)    on excertion   Thoracic aortic aneurysm    Aortic Size Index=     5.0    /Body surface area is 2.43 meters squared. = 2.05  < 2.75 cm/m2      4% risk per year 2.75 to 4.25          8% risk per year > 4.25 cm/m2    20% risk per year   Stable aneurysmal dilation of the ascending aorta with maximum AP diameter of 4.8 cm. Stable area of narrowing of the proximal most portion of the descending aorta measuring 2 cm., previously identified as an area of coarctation. No evidence of aortic dissection.  Coronary artery disease.  Normal appearance of the lungs.   Electronically Signed   By: Fidela Salisbury M.D.   On: 10/01/2014 08:50     Type II diabetes mellitus ([place])    metphormin, average 154 dx 2017    Past Surgical History:  Procedure Laterality Date   ACHILLES TENDON REPAIR Bilateral    AORTIC ARCH ANGIOGRAPHY N/A  03/13/2017   Procedure: AORTIC ARCH ANGIOGRAPHY;  Surgeon: Martinique, Ruslan Mccabe M, MD;  Location: [place] [facility];  Service: Cardiovascular;  Laterality: N/A;   APPENDECTOMY     ATRIAL FIBRILLATION ABLATION  05/11/2017   ATRIAL FIBRILLATION ABLATION N/A 05/11/2017   Procedure: ATRIAL FIBRILLATION ABLATION;  Surgeon: [name], MD;  Location: [place];  Service: Cardiovascular;  Laterality: N/A;   BACK SURGERY     "I've had 7 back and 1 neck ORs" (05/11/2017)   BIOPSY  03/14/2018  Procedure: BIOPSY;  Surgeon: Ronnette Juniper, MD;  Location: [facility];  Service: Gastroenterology;;  EGD and Colon   CARDIAC CATHETERIZATION  2006   CARPAL TUNNEL RELEASE Bilateral    LEFT   CATARACT EXTRACTION W/ INTRAOCULAR LENS  IMPLANT, BILATERAL Bilateral    CERVICAL SPINE SURGERY  06/02/2010   lower back and neck   COLONOSCOPY N/A 03/14/2018   Procedure: COLONOSCOPY;  Surgeon: Ronnette Juniper, MD;  Location: [facility];  Service: Gastroenterology;  Laterality: N/A;   COLONOSCOPY WITH PROPOFOL N/A 12/29/2014   Procedure: COLONOSCOPY WITH PROPOFOL;  Surgeon: Garlan Fair, MD;  Location: [facility];  Service: Endoscopy;  Laterality: N/A;   CORONARY ANGIOPLASTY WITH STENT PLACEMENT  October 2012   CORONARY STENT PLACEMENT  Sept 2012   2nd OM with BMS   ESOPHAGOGASTRODUODENOSCOPY N/A 03/14/2018   Procedure: ESOPHAGOGASTRODUODENOSCOPY (EGD);  Surgeon: Ronnette Juniper, MD;  Location: [facility];  Service: Gastroenterology;  Laterality: N/A;   HEMORROIDECTOMY     LAMINECTOMY  05/30/2012   L 4 L5   LAMINECTOMY WITH POSTERIOR LATERAL ARTHRODESIS LEVEL 3 N/A 10/18/2016   Procedure: Posterior Lateral Fusion - Lumbar One-Four, segmental instrumentation Lumbar One-Five,  decompression,;  Surgeon: Eustace Moore, MD;  Location: [facility];  Service: Neurosurgery;  Laterality: N/A;   LAPAROSCOPIC CHOLECYSTECTOMY     LAPAROSCOPIC GASTRIC BANDING     LEFT AND RIGHT HEART CATHETERIZATION WITH CORONARY ANGIOGRAM N/A  05/07/2014   Procedure: LEFT AND RIGHT HEART CATHETERIZATION WITH CORONARY ANGIOGRAM;  Surgeon: Cana Mignano M Martinique, MD;  Location: [facility];  Service: Cardiovascular;  Laterality: N/A;   LEFT HEART CATH AND CORONARY ANGIOGRAPHY N/A 03/13/2017   Procedure: LEFT HEART CATH AND CORONARY ANGIOGRAPHY;  Surgeon: Martinique, Paislee Szatkowski M, MD;  Location: [facility];  Service: Cardiovascular;  Laterality: N/A;   LUMBAR LAMINECTOMY/DECOMPRESSION MICRODISCECTOMY N/A 05/04/2016   Procedure: Laminectomy and Foraminotomy - Thoracic twelve-Lumbar one -Posterior Fusion Lumbar one-two;  Surgeon: Eustace Moore, MD;  Location: [facility];  Service: Neurosurgery;  Laterality: N/A;   POLYPECTOMY  03/14/2018   Procedure: POLYPECTOMY;  Surgeon: Ronnette Juniper, MD;  Location: [facility];  Service: Gastroenterology;;   POSTERIOR LUMBAR FUSION  10/18/2016   SHOULDER OPEN ROTATOR CUFF REPAIR Bilateral    TONSILLECTOMY AND ADENOIDECTOMY     TOTAL HIP ARTHROPLASTY Right 10/18/2018   Procedure: RIGHT TOTAL HIP ARTHROPLASTY ANTERIOR APPROACH;  Surgeon: Mcarthur Rossetti, MD;  Location: [facility];  Service: Orthopedics;  Laterality: Right;   TRIGGER FINGER RELEASE     LEFT   UVULOPALATOPHARYNGOPLASTY     VASECTOMY      Current Medications: Outpatient Medications Prior to Visit  Medication Sig Dispense Refill   acetaminophen (TYLENOL) 500 MG tablet Take 1,000 mg by mouth every 8 (eight) hours as needed for mild pain or headache.      albuterol (VENTOLIN HFA) 108 (90 Base) MCG/ACT inhaler Inhale 2 puffs into the lungs every 4 (four) hours as needed for wheezing or shortness of breath. 18 g 1   atorvastatin (LIPITOR) 10 MG tablet Take 5 mg by mouth daily at 6 PM.      augmented betamethasone dipropionate (DIPROLENE-AF) 0.05 % cream Apply 1 application topically 2 (two) times daily as needed for itching.  3   Azelastine-Fluticasone 137-50 MCG/ACT SUSP Place 1 puff into the nose in the morning and at bedtime. 23 g 5   benzonatate  (TESSALON) 200 MG capsule Take 1 capsule (200 mg total) by mouth 3 (three) times daily as needed for  cough. 30 capsule 1   budesonide-formoterol (SYMBICORT) 160-4.5 MCG/ACT inhaler Inhale 2 puffs into the lungs 2 (two) times daily. As needed     celecoxib (CELEBREX) 200 MG capsule Take 200 mg by mouth 2 (two) times daily as needed for moderate pain.  0   Cholecalciferol (VITAMIN D-3) 5000 units TABS Take 5,000 Units by mouth daily.     Coenzyme Q10 100 MG capsule Take 100 mg by mouth daily.      furosemide (LASIX) 80 MG tablet Take 1 tablet (80 mg total) by mouth daily.     JARDIANCE 25 MG TABS tablet Take 1 tablet by mouth daily.     losartan (COZAAR) 25 MG tablet TAKE 1 TABLET BY MOUTH EVERY DAY 90 tablet 3   methocarbamol (ROBAXIN) 500 MG tablet Take 1 tablet (500 mg total) by mouth every 8 (eight) hours as needed for muscle spasms. 60 tablet 1   metoprolol tartrate (LOPRESSOR) 25 MG tablet Take 12.5 mg by mouth 2 (two) times daily.     nitroGLYCERIN (NITROSTAT) 0.4 MG SL tablet Place 1 tablet (0.4 mg total) under the tongue every 5 (five) minutes as needed for chest pain (up to the tablets max. IF you are still have Chest pain after the 3rd tablet call 911.). 25 tablet 5   pantoprazole (PROTONIX) 20 MG tablet Take 20 mg by mouth daily.     Polyethyl Glycol-Propyl Glycol (SYSTANE OP) Place 1 drop into both eyes daily as needed (for dry eyes).      potassium chloride SA (KLOR-CON M20) 20 MEQ tablet Taking two tablet by mouth in the am and 1 tablet in the evening     psyllium (METAMUCIL) 58.6 % packet Take 1 packet by mouth daily.     rivaroxaban (XARELTO) 20 MG TABS tablet Take 1 tablet (20 mg total) by mouth daily with supper. 30 tablet    sildenafil (VIAGRA) 100 MG tablet Take 1 tablet (100 mg total) by mouth as needed for erectile dysfunction. 10 tablet 6   Skin Protectants, Misc. (EUCERIN) cream Apply 1 application topically as needed for dry skin.     spironolactone (ALDACTONE) 25 MG tablet  Take 25 mg by mouth daily.     tamsulosin (FLOMAX) 0.4 MG CAPS Take 0.4 mg by mouth every evening.      topiramate (TOPAMAX) 25 MG capsule Take 50 mg by mouth 2 (two) times daily.      No facility-administered medications prior to visit.     Allergies:   Other, Ace inhibitors, Adhesive [tape], Amoxicillin-pot clavulanate, Codeine, Latex, Metformin, Morphine, Nifedipine, and Quinolones   Social History   Socioeconomic History   Marital status: Married    Spouse name: Not on file   Number of children: 3   Years of education: Not on file   Highest education level: Not on file  Occupational History   Occupation: Retired from Scientist, clinical (histocompatibility and immunogenetics): RETIRED  Tobacco Use   Smoking status: Former    Packs/day: 1.50    Years: 30.00    Pack years: 45.00    Types: Cigarettes    Start date: 1958    Quit date: 01/24/1992    Years since quitting: 29.0   Smokeless tobacco: Never  Vaping Use   Vaping Use: Never used  Substance and Sexual Activity   Alcohol use: No   Drug use: No   Sexual activity: Not Currently  Other Topics Concern   Not on file  Social History Narrative  Not on file   Social Determinants of Health   Financial Resource Strain: Not on file  Food Insecurity: Not on file  Transportation Needs: Not on file  Physical Activity: Not on file  Stress: Not on file  Social Connections: Not on file     Family History:  The patient's family history includes Diabetes in his mother; Emphysema (age of onset: 50) in his father; Heart attack in his sister; Heart disease in his mother; Other in his mother.   Review of Systems:   Please see the history of present illness.    All other systems reviewed and are otherwise negative except as noted above.   Physical Exam:    VS:  BP 105/60    Pulse 64    Ht 5\' 7"  (1.702 m)    Wt 250 lb 3.2 oz (113.5 kg)    SpO2 97%    BMI 39.19 kg/m    GENERAL:  Well appearing,  obese WM in NAD HEENT:  PERRL, EOMI, sclera are clear. Oropharynx is  clear. NECK:  No jugular venous distention, carotid upstroke brisk and symmetric, no bruits, no thyromegaly or adenopathy LUNGS:  Clear to auscultation bilaterally CHEST:  Unremarkable HEART:  RRR,  PMI not displaced or sustained,S1 and S2 within normal limits, no S3, no S4: no clicks, no rubs, no murmurs ABD:  Soft, nontender. BS +, no masses or bruits. No hepatomegaly, no splenomegaly EXT:  2 + pulses throughout, 1+ edema, no cyanosis no clubbing SKIN:  Warm and dry.  No rashes NEURO:  Alert and oriented x 3. Cranial nerves II through XII intact. PSYCH:  Cognitively intact        Wt Readings from Last 3 Encounters:  02/16/21 250 lb 3.2 oz (113.5 kg)  01/04/21 253 lb 9.6 oz (115 kg)  12/22/20 255 lb (115.7 kg)      Studies/Labs Reviewed:   EKG:  EKG is not ordered today.    Recent Labs: No results found for requested labs within last 8760 hours.   Lipid Panel    Component Value Date/Time   CHOL 118 01/07/2013 0410   TRIG 133 01/07/2013 0410   HDL 40 01/07/2013 0410   CHOLHDL 3.0 01/07/2013 0410   VLDL 27 01/07/2013 0410   LDLCALC 51 01/07/2013 0410   Labs dated 07/11/16: cholesterol 114, triglycerides 80, HDL 46, LDL 52. A1c 7.1%.  Dated 01/24/17: cholesterol 110, triglycerides 50, HDL 52, LDL 48. A1c 7.8%.  Dated 07/16/17: cholesterol 106, triglycerides 72, HDL 50, LDL 41. Creatinine 1.36. Hgb 12.8. Other chemistries and TSH normal.  Dated 10/10/18: cholesterol 100, triglycerides 54, HDL 51, LDL 38.  Dated 10/28/19: cholesterol 97, triglycerides 35, HDL 53, LDL 38. Iron level 125. A1c 6.7 %. TSH normal.  Dated 11/26/19: creatinine 1.19. glucose 122. Hgb 12.0. plts 125K. Other chemistries normal.  Dated 11/15/20: cholesterol 137, triglycerides 65, HDL 78, LDL 46. A1c 7.3%. CBC, CMET, and TSH normal.  Additional studies/ records that were reviewed today include:   Myoview 03/06/17: Study Highlights   The left ventricular ejection fraction is normal (55-65%). Nuclear  stress EF: 61%. There is a small defect of mild severity present in the basal inferior, mid inferior and apex location. The defect is partially reversible. Overall image quality is poor due to soft tissue attenuation. Cannot rule out a small area of ischemia. There is evidence of transient ischemic dilatation with a TID of 1.27. This is an intermediate risk study. There was no ST segment deviation noted  during stress    Echo 03/07/17: Study Conclusions   - Left ventricle: The cavity size was normal. Wall thickness was   increased in a pattern of mild LVH. Systolic function was normal.   The estimated ejection fraction was in the range of 55% to 60%.   Wall motion was normal; there were no regional wall motion   abnormalities. Left ventricular diastolic function parameters   were normal. - Aortic valve: There was very mild stenosis. Valve area (VTI):   1.81 cm^2. Valve area (Vmax): 1.94 cm^2. Valve area (Vmean): 1.85   cm^2. - Right atrium: The atrium was mildly dilated. - Atrial septum: No defect or patent foramen ovale was identified. - Pulmonary arteries: PA peak pressure: 54 mm Hg (S).  Cardiac cath 03/15/17: Conclusion     Previously placed Ost 2nd Mrg to 2nd Mrg stent (unknown type) is widely patent. There is no aortic valve regurgitation.   1. No significant obstructive CAD 2. Aneurysmal thoracic aorta.    Plan: continue medical management . Consider pulmonary evaluation for symptoms of dyspnea.       Referred for: Dyspnea on exertion   Procedure: This patient underwent staged symptom-limited exercise testing using an individualized bike protocol with expired gas analysis metabolic evaluation during exercise.  Demographics  Age: 48 Ht. (in.) 67.0 Wt. (lb) 272.4 BMI: 42.7      Predicted Peak VO2: 16.0  Gender: Male Ht (cm) 170.2 Wt. (kg) 123.6    Results  Pre-Exercise PFTs   FVC 2.01 (54%)       FEV1 1.21 (43%)         FEV1/FVC 60         MVV 68 (60%)         Exercise Time:    7:00      Watts: 60  RPE: 15  Reason stopped: patient ended test due to dyspnea (9/10)  Additional symptoms: chest tightness (6/10) and lightheaded (5/10)  Resting HR: 55 Peak HR: 107   (49% age predicted max HR)  BP rest: 122/70 BP peak: 170/82  Peak VO2: 11.4 (71% predicted peak VO2)  VE/VCO2 slope:  40  OUES: 1.82  Peak RER: 0.96  Ventilatory Threshold: 8.7 (54% predicted and 76% measured peak VO2)  Peak RR 36  Peak Ventilation:  52.2  VE/MVV:  77%  PETCO2 at peak:  30  O2pulse:  13   (93% predicted O2pulse)   Interpretation  Notes: Patient gave a good effort although he was dyspneic. Pulse-oximetry at rest 97% with a low of 93% at peak exercise. Exercise was performed on a cycle ergometer starting at [facility] and increasing by 10W/min.   ECG:   Resting ECG in sinus bradycardia with 1st degree AV block and incomplete right bundle branch block. HR response blunted. There were frequent PVCs at rest with suppression during exercise with no sustained arrhythmias and no ST-T changes. BP response appropriate.   PFT:  Pre-exercise spirometry demonstrates severe obstruction. MVV below normal range.    CPX:  Exercise Capacity- Exercise testing with gas exchange demonstrates a mildly reduced peak VO2 of 11.4 ml/kg/min (71% of the age/gender/weight matched sedentary norms). The RER of 0.96 indicates a submaximal effort. When adjusted to the patient's ideal body weight of 162.6 lb (73.7 kg) the peak VO2 is 16.0 ml/kg (ibw)/min (70% of the ibw-adjusted predicted).   Cardiovascular response- The O2pulse (a surrogate for strokevolume) increased with incremental exercise reaching peak of 13 ml/beat (93% predicted). DeltaVO2/Delta WR is 8.4 Indicating no  clear evidence of cardiovascular impairment.  Ventilatory response- The VE/VCO2 slope is elevated and indicates Increased dead space ventilation. The oxygen uptake efficiency slope (OUES) is 1.82. The VO2 at the  ventilatory threshold was normal at 54% of the predicted peak VO2 and 76% measured PVO2. At peak exercise, the ventilation reached 54% of the measured MVV and breathing reserve was 16 indicating ventilatory reserve was nearly depleted.  PETCO2 was normal at 61mmHg during exercise.   Conclusion: The interpretation of this test is limited due to submaximal effort during the exercise. Based on available data, exercise testing with gas exchange demonstrates mild functional impairment when compared to matched sedentary norms. Suspect dyspnea is multifactorial, there is evidence of effect of deconditioning and primary limitation due to body habitus.   In addition he had inadequate heart rate response to exercise and a state of hyperventilation at peak exercise, which induced an elevated VE/VCO2 slope. He approached depletion of ventilatory reserve which may be due to his obstructive lung disease.  Test, report and preliminary impression by: Landis Martins, MS, ACSM-RCEP 09/26/2017 3:03 PM  FINALIZED Marshell Garfinkel MD [facility] 10/08/2017, 10:04 AM  Assessment:    No diagnosis found.    Plan:   In order of problems listed above:  1. Paroxysmal Atrial Fibrillation/ Long-term Anticoagulation - s/p DCCV in August 2018.  - s/p Afib and Aflutter ablation in April 2019. Now off antiarrhythmic therapy. - Maintaining NSR - This patients CHA2DS2-VASc Score and unadjusted Ischemic Stroke Rate (% per year) is equal to 9.7 % stroke rate/year from a score of 6 (HTN, DM, Vascular, Age, TE (2)). He denies any evidence of active bleeding. Continue Xarelto for anticoagulation.   2. CAD - s/p BMS to 2nd OM in 2012, cardiac cath Feb 2019 showed continued patency with nonobstructive disease. - asymptomatic.  - Continue  statin therapy. No ASA secondary to need for Xarelto.  3. Chronic diastolic CHF - echo in 7482 showed a preserved EF of 55-60%. He has increased edema and weight due  to increased sodium intake. -. Note right heart findings in 2016.  - Now on Jardiance  - continue current lasix and aldactone - will update Echo - reinforce need for sodium restriction. - he has had extensive evaluation of his dyspnea over the past 3-4 years. This is multifactorial with diastolic CHF, obesity, deconditioning, OSA.   4. HTN - BP is well-controlled  - continue  Current therapy  5. HLD -  LDL 38.  - continue Atorvastatin 10mg  daily.   6. Thoracic aortic aneurysm  - stable at 54.9 cm by last CT Scan. - followed by Dr. Cyndia Bent. with CT Surgery.   7. Chronic respiratory failure due to morbid obesity and OSA. Followed by pulmonary. - recent CPX suggests major limitation is deconditioning and body habitus. Improved with weight loss.   8. Pulmonary HTN secondary to #3 and #7.  Cardiac cath in 2016 showed mild pulmonary venous HTN. Last Echo in Sept 2019 showed normal pulmonary pressure.     Signed, Madelaine Whipple Martin[place], MD,FACC 02/16/2021 11:27 AM    [facility] [address] [place], [place] [address] Phone: [phone]

## 2021-02-07 DIAGNOSIS — M461 Sacroiliitis, not elsewhere classified: Secondary | ICD-10-CM | POA: Diagnosis not present

## 2021-02-16 ENCOUNTER — Encounter: Payer: Self-pay | Admitting: Cardiology

## 2021-02-16 ENCOUNTER — Ambulatory Visit (INDEPENDENT_AMBULATORY_CARE_PROVIDER_SITE_OTHER): Payer: Medicare Other | Admitting: Cardiology

## 2021-02-16 ENCOUNTER — Other Ambulatory Visit: Payer: Self-pay

## 2021-02-16 VITALS — BP 105/60 | HR 64 | Ht 67.0 in | Wt 250.2 lb

## 2021-02-16 DIAGNOSIS — I10 Essential (primary) hypertension: Secondary | ICD-10-CM | POA: Diagnosis not present

## 2021-02-16 DIAGNOSIS — I5032 Chronic diastolic (congestive) heart failure: Secondary | ICD-10-CM | POA: Diagnosis not present

## 2021-02-16 DIAGNOSIS — I2510 Atherosclerotic heart disease of native coronary artery without angina pectoris: Secondary | ICD-10-CM | POA: Diagnosis not present

## 2021-02-16 DIAGNOSIS — I480 Paroxysmal atrial fibrillation: Secondary | ICD-10-CM

## 2021-02-16 NOTE — Addendum Note (Signed)
Addended by: Kathyrn Lass on: 02/16/2021 11:36 AM   Modules accepted: Orders

## 2021-02-16 NOTE — Patient Instructions (Signed)
Medication Instructions:  Continue same medications *If you need a refill on your cardiac medications before your next appointment, please call your pharmacy*   Lab Work: None ordered   Testing/Procedures: Schedule  Echo   Follow-Up: At [facility], you and your health needs are our priority.  As part of our continuing mission to provide you with exceptional heart care, we have created designated Provider Care Teams.  These Care Teams include your primary Cardiologist (physician) and Advanced Practice Providers (APPs -  Physician Assistants and Nurse Practitioners) who all work together to provide you with the care you need, when you need it.  We recommend signing up for the patient portal called "MyChart".  Sign up information is provided on this After Visit Summary.  MyChart is used to connect with patients for Virtual Visits (Telemedicine).  Patients are able to view lab/test results, encounter notes, upcoming appointments, etc.  Non-urgent messages can be sent to your provider as well.   To learn more about what you can do with MyChart, go to [email or web address].     Your next appointment:  6 months   Call in April to schedule July appointment     The format for your next appointment: Office   Provider:  Dr.Jordan

## 2021-02-17 DIAGNOSIS — I10 Essential (primary) hypertension: Secondary | ICD-10-CM | POA: Diagnosis not present

## 2021-02-17 DIAGNOSIS — I509 Heart failure, unspecified: Secondary | ICD-10-CM | POA: Diagnosis not present

## 2021-02-17 DIAGNOSIS — B370 Candidal stomatitis: Secondary | ICD-10-CM | POA: Diagnosis not present

## 2021-02-17 DIAGNOSIS — D509 Iron deficiency anemia, unspecified: Secondary | ICD-10-CM | POA: Diagnosis not present

## 2021-02-17 DIAGNOSIS — N183 Chronic kidney disease, stage 3 unspecified: Secondary | ICD-10-CM | POA: Diagnosis not present

## 2021-02-17 DIAGNOSIS — I700 Atherosclerosis of aorta: Secondary | ICD-10-CM | POA: Diagnosis not present

## 2021-02-17 DIAGNOSIS — R490 Dysphonia: Secondary | ICD-10-CM | POA: Diagnosis not present

## 2021-02-17 DIAGNOSIS — I2510 Atherosclerotic heart disease of native coronary artery without angina pectoris: Secondary | ICD-10-CM | POA: Diagnosis not present

## 2021-02-17 DIAGNOSIS — E785 Hyperlipidemia, unspecified: Secondary | ICD-10-CM | POA: Diagnosis not present

## 2021-02-17 DIAGNOSIS — E0822 Diabetes mellitus due to underlying condition with diabetic chronic kidney disease: Secondary | ICD-10-CM | POA: Diagnosis not present

## 2021-02-17 DIAGNOSIS — Z7984 Long term (current) use of oral hypoglycemic drugs: Secondary | ICD-10-CM | POA: Diagnosis not present

## 2021-02-17 DIAGNOSIS — E1165 Type 2 diabetes mellitus with hyperglycemia: Secondary | ICD-10-CM | POA: Diagnosis not present

## 2021-02-18 ENCOUNTER — Telehealth: Payer: Self-pay | Admitting: Pulmonary Disease

## 2021-02-18 DIAGNOSIS — G4733 Obstructive sleep apnea (adult) (pediatric): Secondary | ICD-10-CM

## 2021-02-18 NOTE — Telephone Encounter (Signed)
Spoke to [place] with [employer]. He is unsure if machine can be swapped out but suggested that we go ahead and place an order to do so.   Order placed.  Patient is aware and voiced his understanding.  Nothing further needed.

## 2021-02-18 NOTE — Telephone Encounter (Signed)
Can we check with [employer] about whether they can swap out his luna device for a resmed device.  He would need CPAP at 10 cm H2O with heated humidity.  If this can't be swapped by [employer], then he can go to CPAP.com or similar website to look into ordering a CPAP machine.  There will be prompts on the website on how to get the required forms to me to complete his prescription to order a CPAP machine on line.

## 2021-02-18 NOTE — Telephone Encounter (Signed)
Spoke to patient.  Patient stated that he received his new cpap machine. He is not tolerating it as well as the resmed machine that he had previously.  He currently has a Luna 2 and it is not heated. He has humidity set to max.   He is waking up with dry mouth, prod cough with clear sputum and nasal/chest congestion. He completed course of Doxycycline 3-4d ago. He had some improvement in cough but it did not completely subside.  Sx have been present since July when he received the new machine.  He is willing to pay out of pocket for a resmed machine.   Dr. Halford Chessman please advise.

## 2021-03-02 DIAGNOSIS — Z6839 Body mass index (BMI) 39.0-39.9, adult: Secondary | ICD-10-CM | POA: Diagnosis not present

## 2021-03-02 DIAGNOSIS — Z8546 Personal history of malignant neoplasm of prostate: Secondary | ICD-10-CM | POA: Diagnosis not present

## 2021-03-02 DIAGNOSIS — I10 Essential (primary) hypertension: Secondary | ICD-10-CM | POA: Diagnosis not present

## 2021-03-02 DIAGNOSIS — Z923 Personal history of irradiation: Secondary | ICD-10-CM | POA: Diagnosis not present

## 2021-03-02 DIAGNOSIS — M461 Sacroiliitis, not elsewhere classified: Secondary | ICD-10-CM | POA: Diagnosis not present

## 2021-03-02 DIAGNOSIS — E349 Endocrine disorder, unspecified: Secondary | ICD-10-CM | POA: Diagnosis not present

## 2021-03-02 DIAGNOSIS — C50922 Malignant neoplasm of unspecified site of left male breast: Secondary | ICD-10-CM | POA: Diagnosis not present

## 2021-03-02 DIAGNOSIS — Z08 Encounter for follow-up examination after completed treatment for malignant neoplasm: Secondary | ICD-10-CM | POA: Diagnosis not present

## 2021-03-04 ENCOUNTER — Telehealth: Payer: Self-pay | Admitting: *Deleted

## 2021-03-04 DIAGNOSIS — I480 Paroxysmal atrial fibrillation: Secondary | ICD-10-CM

## 2021-03-04 DIAGNOSIS — Z01818 Encounter for other preprocedural examination: Secondary | ICD-10-CM

## 2021-03-04 NOTE — Telephone Encounter (Signed)
Clinical pharmacist to review Xarelto 

## 2021-03-04 NOTE — Telephone Encounter (Signed)
° °  Pre-operative Risk Assessment    Patient Name: Carlos Dawson  DOB: [birth year] MRN: [id number]      Request for Surgical Clearance    Procedure:   SACROILIAC FUSION  Date of Surgery:  Clearance TBD                                 Surgeon:  DR. Elwin Sleight Surgeon's Group or Practice Name:  [facility] Phone number:  [phone] Fax number:  [phone] ATTN: Lexine Baton   Type of Clearance Requested:   - Medical  - Pharmacy:  Hold Rivaroxaban (Xarelto)     Type of Anesthesia:  General    Additional requests/questions:    Jiles Prows   03/04/2021, 9:16 AM

## 2021-03-07 ENCOUNTER — Ambulatory Visit (HOSPITAL_COMMUNITY): Payer: Medicare Other | Attending: Cardiology

## 2021-03-07 ENCOUNTER — Other Ambulatory Visit: Payer: Self-pay

## 2021-03-07 ENCOUNTER — Encounter: Payer: Self-pay | Admitting: Pulmonary Disease

## 2021-03-07 DIAGNOSIS — I480 Paroxysmal atrial fibrillation: Secondary | ICD-10-CM | POA: Diagnosis not present

## 2021-03-07 DIAGNOSIS — I10 Essential (primary) hypertension: Secondary | ICD-10-CM | POA: Insufficient documentation

## 2021-03-07 DIAGNOSIS — I5032 Chronic diastolic (congestive) heart failure: Secondary | ICD-10-CM | POA: Diagnosis not present

## 2021-03-07 DIAGNOSIS — I2510 Atherosclerotic heart disease of native coronary artery without angina pectoris: Secondary | ICD-10-CM | POA: Insufficient documentation

## 2021-03-07 DIAGNOSIS — G4733 Obstructive sleep apnea (adult) (pediatric): Secondary | ICD-10-CM

## 2021-03-07 DIAGNOSIS — R9431 Abnormal electrocardiogram [ECG] [EKG]: Secondary | ICD-10-CM | POA: Diagnosis not present

## 2021-03-07 LAB — ECHOCARDIOGRAM COMPLETE
AR max vel: 2.63 cm2
AV Area VTI: 2.58 cm2
AV Area mean vel: 2.69 cm2
AV Mean grad: 9 mmHg
AV Peak grad: 17.8 mmHg
Ao pk vel: 2.11 m/s
Area-P 1/2: 2.66 cm2
S' Lateral: 3.4 cm

## 2021-03-08 ENCOUNTER — Encounter: Payer: Self-pay | Admitting: Pulmonary Disease

## 2021-03-08 ENCOUNTER — Telehealth: Payer: Self-pay | Admitting: Pulmonary Disease

## 2021-03-08 NOTE — Telephone Encounter (Signed)
Patient with diagnosis of atrial fibrillation on Xarelto for anticoagulation.    Procedure: sacroiliac fusion Date of procedure: TBD   CHA2DS2-VASc Score = 6   This indicates a 9.7% annual risk of stroke. The patient's score is based upon: CHF History: 1 HTN History: 1 Diabetes History: 1 Stroke History: 0 Vascular Disease History: 1 Age Score: 2 Gender Score: 0   Patient also has history of PE, but from chart can only determine that it was prior to 2012  CrCl 47  Last CBC is > 1 year, he will need repeat lab for final clearance.

## 2021-03-09 ENCOUNTER — Other Ambulatory Visit: Payer: Self-pay | Admitting: *Deleted

## 2021-03-09 DIAGNOSIS — Z01818 Encounter for other preprocedural examination: Secondary | ICD-10-CM

## 2021-03-09 DIAGNOSIS — I480 Paroxysmal atrial fibrillation: Secondary | ICD-10-CM

## 2021-03-09 DIAGNOSIS — N62 Hypertrophy of breast: Secondary | ICD-10-CM | POA: Diagnosis not present

## 2021-03-09 NOTE — Telephone Encounter (Signed)
Per pharmD, last CBC is >1 year ago, so needs labs. I also do not see a recent BMET in our system so would recommend obtaining at same time. Will route to callback to help arrange.

## 2021-03-09 NOTE — Telephone Encounter (Signed)
Spoke to pt, he stated that he would go to [facility] tomorrow to have his labs done.

## 2021-03-09 NOTE — Telephone Encounter (Signed)
Okay to send order for Resmed CPAP machine.  He has been on CPAP 10 cm H2O.

## 2021-03-09 NOTE — Telephone Encounter (Signed)
I left message for the pt to call the office and let us know what he day he wants to come in and have lab work done for pre op clearance, BMET, CBC. I will place the lab orders for labs to be done at [facility] as this is the location where he see's Dr. Martinique.

## 2021-03-09 NOTE — Telephone Encounter (Signed)
Called and LVM in regards to patients question regarding CPAP machine. Will try again later.

## 2021-03-09 NOTE — Telephone Encounter (Signed)
Received the following message from patient:   "Need to speak with nurse or Dr. Halford Chessman to get a prescription sent for new CPAp machine. Need it faxed to fax number [phone] and order number [id number] attention Arbie Cookey.  I am purchasing a new Resmed since I have been having trouble with the one that was supplied by Medicare.  My number is [phone]  PS. (Talked with [facility] as Dr. Halford Chessman suggested,they said there was nothing they could do.)"  Dr. Halford Chessman, are you ok with [place] ordering a new cpap for him?

## 2021-03-09 NOTE — Telephone Encounter (Signed)
Patient is returning phone call. Patient phone number is [phone].

## 2021-03-10 DIAGNOSIS — I480 Paroxysmal atrial fibrillation: Secondary | ICD-10-CM | POA: Diagnosis not present

## 2021-03-10 DIAGNOSIS — Z9884 Bariatric surgery status: Secondary | ICD-10-CM | POA: Diagnosis not present

## 2021-03-10 DIAGNOSIS — R590 Localized enlarged lymph nodes: Secondary | ICD-10-CM | POA: Diagnosis not present

## 2021-03-10 DIAGNOSIS — Z01818 Encounter for other preprocedural examination: Secondary | ICD-10-CM | POA: Diagnosis not present

## 2021-03-10 LAB — BASIC METABOLIC PANEL
BUN/Creatinine Ratio: 21 (ref 10–24)
BUN: 22 mg/dL (ref 8–27)
CO2: 23 mmol/L (ref 20–29)
Calcium: 8.8 mg/dL (ref 8.6–10.2)
Chloride: 108 mmol/L — ABNORMAL HIGH (ref 96–106)
Creatinine, Ser: 1.05 mg/dL (ref 0.76–1.27)
Glucose: 130 mg/dL — ABNORMAL HIGH (ref 70–99)
Potassium: 4.5 mmol/L (ref 3.5–5.2)
Sodium: 141 mmol/L (ref 134–144)
eGFR: 72 mL/min/{1.73_m2} (ref >59)

## 2021-03-10 NOTE — Telephone Encounter (Signed)
Patient states [facility] order number is [id number]. Fax number is [phone].Patient phone number is [phone].

## 2021-03-10 NOTE — Telephone Encounter (Signed)
Patient is returning phone call. Will be available to 11:30 am. Patient phone number is [phone].

## 2021-03-11 LAB — CBC
Hematocrit: 31.6 % — ABNORMAL LOW (ref 37.5–51.0)
Hemoglobin: 12 g/dL — ABNORMAL LOW (ref 13.0–17.7)
MCH: 38.5 pg — ABNORMAL HIGH (ref 26.6–33.0)
MCHC: 38 g/dL — ABNORMAL HIGH (ref 31.5–35.7)
MCV: 101 fL — ABNORMAL HIGH (ref 79–97)
Platelets: 112 10*3/uL — ABNORMAL LOW (ref 150–450)
RBC: 3.12 x10E6/uL — ABNORMAL LOW (ref 4.14–5.80)
RDW: 14.1 % (ref 11.6–15.4)
WBC: 5.3 10*3/uL (ref 3.4–10.8)

## 2021-03-11 NOTE — Telephone Encounter (Signed)
See MyChart message

## 2021-03-11 NOTE — Telephone Encounter (Signed)
° °  Name: Carlos Dawson  DOB: 15-Jan-1941  MRN: [id number]   Primary Cardiologist: Peter Martinique, MD  Chart reviewed as part of pre-operative protocol coverage. Patient was contacted 03/11/2021 in reference to pre-operative risk assessment for pending surgery as outlined below.  Carlos Dawson was last seen on 01/04/2021 by Dr. Rayann Heman.  Since that day, Carlos Dawson has done well and is without symptoms of angina or decompensation. Updated labs show an improved Hgb with a relatively stable thrombocytopenia. Per pharmacy team, the patient may hold Xarelto for 3 days prior to surgery with recommendation to resume as soon as safely possible in the postoperative timeframe as deemed by the surgical team.    Therefore, based on ACC/AHA guidelines, the patient would be at acceptable risk for the planned procedure without further cardiovascular testing.   The patient was advised that if he develops new symptoms prior to surgery to contact our office to arrange for a follow-up visit, and he verbalized understanding.  I will route this recommendation to the requesting party via Epic fax function and remove from pre-op pool. Please call with questions.  Christell Faith, PA-C 03/11/2021, 8:53 AM

## 2021-03-11 NOTE — Telephone Encounter (Signed)
Labs have resulted. Plt 112K, SCr 1.05, CrCl 53mL/min using adjusted body weight due to obesity.  Ok for pt to hold Xarelto for 3 days prior to spinal procedure.

## 2021-03-15 ENCOUNTER — Other Ambulatory Visit: Payer: Self-pay | Admitting: Surgery

## 2021-03-15 DIAGNOSIS — R590 Localized enlarged lymph nodes: Secondary | ICD-10-CM

## 2021-03-29 ENCOUNTER — Encounter: Payer: Self-pay | Admitting: Pulmonary Disease

## 2021-03-29 ENCOUNTER — Other Ambulatory Visit: Payer: Self-pay

## 2021-03-29 ENCOUNTER — Ambulatory Visit (INDEPENDENT_AMBULATORY_CARE_PROVIDER_SITE_OTHER): Payer: Medicare Other | Admitting: Pulmonary Disease

## 2021-03-29 VITALS — BP 108/62 | HR 73 | Temp 98.0°F | Ht 67.0 in | Wt 251.0 lb

## 2021-03-29 DIAGNOSIS — I2510 Atherosclerotic heart disease of native coronary artery without angina pectoris: Secondary | ICD-10-CM | POA: Diagnosis not present

## 2021-03-29 DIAGNOSIS — J449 Chronic obstructive pulmonary disease, unspecified: Secondary | ICD-10-CM | POA: Diagnosis not present

## 2021-03-29 DIAGNOSIS — J309 Allergic rhinitis, unspecified: Secondary | ICD-10-CM

## 2021-03-29 DIAGNOSIS — R053 Chronic cough: Secondary | ICD-10-CM

## 2021-03-29 DIAGNOSIS — G4733 Obstructive sleep apnea (adult) (pediatric): Secondary | ICD-10-CM | POA: Diagnosis not present

## 2021-03-29 MED ORDER — BENZONATATE 200 MG PO CAPS: 200.0000 mg | ORAL_CAPSULE | Freq: Three times a day (TID) | ORAL | 1 refills | Status: DC | PRN

## 2021-03-29 NOTE — Patient Instructions (Signed)
Use azelastine-fluticasone nasal spray nightly. ?Sip water when you have the urge to cough. ?Salt water gargle twice per day. ?1 teaspoon of honey twice per day. ?Sugarless candy to help keep your mouth moist. ?Avoid forcing a cough or clearing your throat, and allow your voice to rest when able. ?Benzonatate every 8 hours as needed to help with cough. ?Will have [employer] arrange for replacement CPAP mask and supplies. ?Follow up in 1 year. ?

## 2021-03-29 NOTE — Progress Notes (Signed)
[facility]  Chief Complaint  Patient presents with   Follow-up    Follow up. Patient has no complaints.     Constitutional:  BP 108/62 (BP Location: Right Arm, Patient Position: Sitting, Cuff Size: Normal)    Pulse 73    Temp 98 F (36.7 C) (Oral)    Ht '5\' 7"'$  (1.702 m)    Wt 251 lb (113.9 kg)    SpO2 99%    BMI 39.31 kg/m   Past Medical History:  Ascending aorta dilation, CAD s/p stent, Diastolic CHF, HTN,  A fib, Colon polyps, DVT, PE, Headaches, GERD, PUD, IBS, OA, Pneumonia, Prostate cancer, DM type 2  Past Surgical History:  His  has a past surgical history that includes Hemorroidectomy; Vasectomy; Cervical spine surgery (06/02/2010); Achilles tendon repair (Bilateral); Coronary stent placement (Sept 2012); Appendectomy; Laminectomy (05/30/2012); left and right heart catheterization with coronary angiogram (N/A, 05/07/2014); Laparoscopic gastric banding; Uvulopalatopharyngoplasty; Colonoscopy with propofol (N/A, 12/29/2014); Carpal tunnel release (Bilateral); Trigger finger release; Lumbar laminectomy/decompression microdiscectomy (N/A, 05/04/2016); Posterior lumbar fusion (10/18/2016); Laminectomy with posterior lateral arthrodesis level 3 (N/A, 10/18/2016); LEFT HEART CATH AND CORONARY ANGIOGRAPHY (N/A, 03/13/2017); AORTIC ARCH ANGIOGRAPHY (N/A, 03/13/2017); ATRIAL FIBRILLATION ABLATION (05/11/2017); Laparoscopic cholecystectomy; Shoulder open rotator cuff repair (Bilateral); Back surgery; Cataract extraction w/ intraocular lens  implant, bilateral (Bilateral); Tonsillectomy and adenoidectomy; Cardiac catheterization (2006); Coronary angioplasty with stent (October 2012); ATRIAL FIBRILLATION ABLATION (N/A, 05/11/2017); Esophagogastroduodenoscopy (N/A, 03/14/2018); Colonoscopy (N/A, 03/14/2018); biopsy (03/14/2018); polypectomy (03/14/2018); and Total hip arthroplasty (Right, 10/18/2018).  Brief Summary:  Carlos Dawson is a 81 y.o. male former smoker with  dyspnea, OSA, and asthma.      Subjective:   He got a Luna device. He then developed recurrent sinus and respiratory infections.  He eventually had to buy his own Resmed device.  Since switching he is not having as much sinus congestion, but cough has persisted.    Physical Exam:   Appearance - well kempt   ENMT - no sinus tenderness, no oral exudate, no LAN, Mallampati 4 airway, no stridor  Respiratory - equal breath sounds bilaterally, no wheezing or rales  CV - s1s2 regular rate and rhythm, no murmurs  Ext - no clubbing, no edema  Skin - no rashes  Psych - normal mood and affect      Pulmonary testing:  PFT 12/20/10 >> FEV1 2.13(79%), FEV1% 71, TLC 5.80(100%), DLCO 57%, no BD Spirometry 05/11/15 >> FEV1 1.68 (55%), FEV1% 69 FeNO 08/13/15 >> 13 Room air SpO2 with exertion 08/13/15 >> 87% - improved with 2 liters oxygen  Chest Imaging:  CT chest 09/14/11 >> Stable dilatation of ascending thoracic aorta 4.7 cm, PA 4.1 cm CT angio chest 10/01/14 >> aortic dilation CT angio chest 09/15/16 >> 5 cm ascending TAA CT angio chest 04/24/19 >> ascending aorta 4.8 x 4.8 cm  Sleep Tests:  PSG 03/30/97 >> AHI 21 PSG 10/21/13 >> AHI 40.1, SaO2 80%, PLMI 75.6 CPAP 12/28/13 >> CPAP 10 cm H2O >> AHI 1.5, +R  ONO with CPAP 08/19/15 >> test time 8 hrs 40 min.  Basal SpO2 92.3%, low SpO2 86%.  Spent 1.3 min with SpO2 < 88%. CPAP 03/15/20 to 04/13/20 >> used on 30 of 30 nights with average 8 hrs 15 min.  Average AHI 6 with CPAP 10 cm H2O  Cardiac Tests:  [facility] 03/13/17 >> patent Ost 2nd Mrg stent Cardiopulmonary exercise test 09/26/17 >> deconditioning Echo 10/17/17 >> EF 60 to 65%, grade 2  DD  Social History:  He  reports that he quit smoking about 29 years ago. His smoking use included cigarettes. He started smoking about 65 years ago. He has a 45.00 pack-year smoking history. He has never used smokeless tobacco. He reports that he does not drink alcohol and does not use drugs.  Family History:   His family history includes Diabetes in his mother; Emphysema (age of onset: 43) in his father; Heart attack in his sister; Heart disease in his mother; Other in his mother.     Assessment/Plan:   COPD with asthma. - breztri caused more trouble with his breathing - continue symbicort - prn albuterol   Obstructive sleep apnea. - he is compliant with CPAP and reports benefit from therapy - uses [employer] for his DME - he developed recurrent upper respiratory infections when he got replacement Luna device - he ultimately had to purchase his own Resmed device - will see if he can start getting replacement mask and supplies through [employer] again   Allergic rhinitis with upper airway cough syndrome. - seems to be main issue at present - he is to use nasal irrigation, azelastine, and fluticasone nightly - salt water gargle bid - sip water when he has urge to cough and use sugarless candy to keep his mouth moist - 1 teaspoon of honey bid for cough - benzonatate tid prn for cough  A fib s/p ablation, chronic diastolic CHF. - followed by Dr. Peter Martinique with [facility]   Ascending thoracic aortic aneurysm. - he will be changing to Dr. Cyndia Bent for follow up  Time Spent Involved in Patient Care on Day of Examination:  37 minutes  Follow up:   Patient Instructions  Use azelastine-fluticasone nasal spray nightly. Sip water when you have the urge to cough. Salt water gargle twice per day. 1 teaspoon of honey twice per day. Sugarless candy to help keep your mouth moist. Avoid forcing a cough or clearing your throat, and allow your voice to rest when able. Benzonatate every 8 hours as needed to help with cough. Will have [employer] arrange for replacement CPAP mask and supplies. Follow up in 1 year.  Medication List:   Allergies as of 03/29/2021       Reactions   Other    Other reaction(s): cough, Other Other reaction(s): cough Other reaction(s): wt gain   Ace Inhibitors Cough    Adhesive [tape] Itching, Rash   Amoxicillin-pot Clavulanate Other (See Comments)   Other reaction(s): GI Upset (intolerance)   Codeine Nausea Only   Other reaction(s): nausea Other reaction(s): nausea Other reaction(s): nausea   Latex Itching, Rash, Other (See Comments)   Bandaids   Metformin Diarrhea   Morphine Itching   Nifedipine Other (See Comments)   Other reaction(s): leg edema   Quinolones Other (See Comments)   Patient was warned about not using Cipro and similar antibiotics. Recent studies have raised concern that fluoroquinolone antibiotics could be associated with an increased risk of aortic aneurysm Fluoroquinolones have non-antimicrobial properties that might jeopardise the integrity of the extracellular matrix of the vascular wall In a  propensity score matched cohort study in [place], there was a 66% increased rate of aortic aneurysm or dissection associated with oral fluoroquinolone use, compared wit        Medication List        Accurate as of March 29, 2021  3:40 PM. If you have any questions, ask your nurse or doctor.  acetaminophen 500 MG tablet Commonly known as: TYLENOL Take 1,000 mg by mouth every 8 (eight) hours as needed for mild pain or headache.   albuterol 108 (90 Base) MCG/ACT inhaler Commonly known as: VENTOLIN HFA Inhale 2 puffs into the lungs every 4 (four) hours as needed for wheezing or shortness of breath.   atorvastatin 10 MG tablet Commonly known as: LIPITOR Take 5 mg by mouth daily at 6 PM.   augmented betamethasone dipropionate 0.05 % cream Commonly known as: DIPROLENE-AF Apply 1 application topically 2 (two) times daily as needed for itching.   Azelastine-Fluticasone 137-50 MCG/ACT Susp Place 1 puff into the nose in the morning and at bedtime.   benzonatate 200 MG capsule Commonly known as: TESSALON Take 1 capsule (200 mg total) by mouth 3 (three) times daily as needed for cough.   budesonide-formoterol 160-4.5  MCG/ACT inhaler Commonly known as: SYMBICORT Inhale 2 puffs into the lungs 2 (two) times daily. As needed   celecoxib 200 MG capsule Commonly known as: CELEBREX Take 200 mg by mouth 2 (two) times daily as needed for moderate pain.   Coenzyme Q10 100 MG capsule Take 100 mg by mouth daily.   eucerin cream Apply 1 application topically as needed for dry skin.   furosemide 80 MG tablet Commonly known as: LASIX Take 1 tablet (80 mg total) by mouth daily.   Jardiance 25 MG Tabs tablet Generic drug: empagliflozin Take 1 tablet by mouth daily.   losartan 25 MG tablet Commonly known as: COZAAR TAKE 1 TABLET BY MOUTH EVERY DAY   methocarbamol 500 MG tablet Commonly known as: ROBAXIN Take 1 tablet (500 mg total) by mouth every 8 (eight) hours as needed for muscle spasms.   metoprolol tartrate 25 MG tablet Commonly known as: LOPRESSOR Take 12.5 mg by mouth 2 (two) times daily.   nitroGLYCERIN 0.4 MG SL tablet Commonly known as: Nitrostat Place 1 tablet (0.4 mg total) under the tongue every 5 (five) minutes as needed for chest pain (up to the tablets max. IF you are still have Chest pain after the 3rd tablet call 911.).   pantoprazole 20 MG tablet Commonly known as: PROTONIX Take 20 mg by mouth daily.   potassium chloride SA 20 MEQ tablet Commonly known as: Klor-Con M20 Taking two tablet by mouth in the am and 1 tablet in the evening   psyllium 58.6 % packet Commonly known as: METAMUCIL Take 1 packet by mouth daily.   rivaroxaban 20 MG Tabs tablet Commonly known as: XARELTO Take 1 tablet (20 mg total) by mouth daily with supper.   sildenafil 100 MG tablet Commonly known as: VIAGRA Take 1 tablet (100 mg total) by mouth as needed for erectile dysfunction.   spironolactone 25 MG tablet Commonly known as: ALDACTONE Take 25 mg by mouth daily.   SYSTANE OP Place 1 drop into both eyes daily as needed (for dry eyes).   tamsulosin 0.4 MG Caps capsule Commonly known as:  FLOMAX Take 0.4 mg by mouth every evening.   topiramate 25 MG capsule Commonly known as: TOPAMAX Take 50 mg by mouth 2 (two) times daily.   Vitamin D-3 125 MCG (5000 UT) Tabs Take 5,000 Units by mouth daily.        Signature:  Chesley Mires, MD [facility] Pager - [phone] 03/29/2021, 3:40 PM

## 2021-04-04 DIAGNOSIS — M720 Palmar fascial fibromatosis [Dupuytren]: Secondary | ICD-10-CM | POA: Diagnosis not present

## 2021-04-04 DIAGNOSIS — G5621 Lesion of ulnar nerve, right upper limb: Secondary | ICD-10-CM | POA: Diagnosis not present

## 2021-04-04 DIAGNOSIS — M19049 Primary osteoarthritis, unspecified hand: Secondary | ICD-10-CM | POA: Insufficient documentation

## 2021-04-04 DIAGNOSIS — M13841 Other specified arthritis, right hand: Secondary | ICD-10-CM | POA: Diagnosis not present

## 2021-04-04 DIAGNOSIS — M79641 Pain in right hand: Secondary | ICD-10-CM | POA: Insufficient documentation

## 2021-04-05 ENCOUNTER — Other Ambulatory Visit: Payer: Self-pay

## 2021-04-05 ENCOUNTER — Other Ambulatory Visit: Payer: Medicare Other

## 2021-04-05 ENCOUNTER — Ambulatory Visit
Admission: RE | Admit: 2021-04-05 | Discharge: 2021-04-05 | Disposition: A | Discharge status: Home / Self Care | Payer: Medicare Other | Source: Ambulatory Visit | Attending: Surgery | Admitting: Surgery

## 2021-04-05 DIAGNOSIS — M5136 Other intervertebral disc degeneration, lumbar region: Secondary | ICD-10-CM | POA: Diagnosis not present

## 2021-04-05 DIAGNOSIS — I700 Atherosclerosis of aorta: Secondary | ICD-10-CM | POA: Diagnosis not present

## 2021-04-05 DIAGNOSIS — R590 Localized enlarged lymph nodes: Secondary | ICD-10-CM | POA: Diagnosis not present

## 2021-04-05 DIAGNOSIS — Z981 Arthrodesis status: Secondary | ICD-10-CM | POA: Diagnosis not present

## 2021-04-05 MED ORDER — IOPAMIDOL (ISOVUE-300) INJECTION 61%
100.0000 mL | Freq: Once | INTRAVENOUS | Status: AC | PRN
  Administered 2021-04-05: 100 mL via INTRAVENOUS

## 2021-04-07 DIAGNOSIS — Z01812 Encounter for preprocedural laboratory examination: Secondary | ICD-10-CM | POA: Diagnosis not present

## 2021-04-07 DIAGNOSIS — M533 Sacrococcygeal disorders, not elsewhere classified: Secondary | ICD-10-CM | POA: Diagnosis not present

## 2021-04-12 DIAGNOSIS — Z01810 Encounter for preprocedural cardiovascular examination: Secondary | ICD-10-CM | POA: Diagnosis not present

## 2021-04-12 DIAGNOSIS — I454 Nonspecific intraventricular block: Secondary | ICD-10-CM | POA: Diagnosis not present

## 2021-04-12 DIAGNOSIS — I440 Atrioventricular block, first degree: Secondary | ICD-10-CM | POA: Diagnosis not present

## 2021-04-12 DIAGNOSIS — M461 Sacroiliitis, not elsewhere classified: Secondary | ICD-10-CM | POA: Diagnosis not present

## 2021-04-12 DIAGNOSIS — Z7984 Long term (current) use of oral hypoglycemic drugs: Secondary | ICD-10-CM | POA: Diagnosis not present

## 2021-04-12 DIAGNOSIS — I491 Atrial premature depolarization: Secondary | ICD-10-CM | POA: Diagnosis not present

## 2021-04-12 DIAGNOSIS — E119 Type 2 diabetes mellitus without complications: Secondary | ICD-10-CM | POA: Diagnosis not present

## 2021-04-13 DIAGNOSIS — Z7984 Long term (current) use of oral hypoglycemic drugs: Secondary | ICD-10-CM | POA: Diagnosis not present

## 2021-04-13 DIAGNOSIS — M461 Sacroiliitis, not elsewhere classified: Secondary | ICD-10-CM | POA: Diagnosis not present

## 2021-04-13 DIAGNOSIS — E119 Type 2 diabetes mellitus without complications: Secondary | ICD-10-CM | POA: Diagnosis not present

## 2021-05-17 DIAGNOSIS — M549 Dorsalgia, unspecified: Secondary | ICD-10-CM | POA: Diagnosis not present

## 2021-05-17 DIAGNOSIS — M48062 Spinal stenosis, lumbar region with neurogenic claudication: Secondary | ICD-10-CM | POA: Diagnosis not present

## 2021-05-17 DIAGNOSIS — M961 Postlaminectomy syndrome, not elsewhere classified: Secondary | ICD-10-CM | POA: Diagnosis not present

## 2021-05-17 DIAGNOSIS — M5431 Sciatica, right side: Secondary | ICD-10-CM | POA: Diagnosis not present

## 2021-05-20 ENCOUNTER — Other Ambulatory Visit: Payer: Self-pay | Admitting: Primary Care

## 2021-05-20 DIAGNOSIS — G5621 Lesion of ulnar nerve, right upper limb: Secondary | ICD-10-CM | POA: Diagnosis not present

## 2021-05-20 DIAGNOSIS — G5601 Carpal tunnel syndrome, right upper limb: Secondary | ICD-10-CM | POA: Insufficient documentation

## 2021-05-20 NOTE — Telephone Encounter (Signed)
Medications from outside sources need reconciliation.   ? ?Changes Requested ? ? Name from pharmacy: AZELASTIN-FLUTIC 137-50MCG SPR  ?    ? Will file in chart as: Azelastine-Fluticasone 137-50 MCG/ACT SUSP  ? Sig: Place 1 puff into the nose in the morning and at bedtime.  ? Disp:  Not specified (Pharmacy requested: 23 each)    Refills:  5  ? Start: 05/20/2021  ? Class: Normal  ? Non-formulary  ? Last ordered: 7 months ago by Martyn Ehrich, NP Last refill: 05/20/2021  ? Rx #: [id number]  ? Pharmacy comment: Alternative Requested:PER INSURANCE NO LONGER TAKING.  ?   ?To be filled at: [id number] [place] - [address] ?  ? ? ?Beth, please advise on alternative. ?

## 2021-05-25 ENCOUNTER — Ambulatory Visit
Admission: EM | Admit: 2021-05-25 | Discharge: 2021-05-25 | Disposition: A | Discharge status: Home / Self Care | Payer: Medicare Other | Attending: Urgent Care | Admitting: Urgent Care

## 2021-05-25 DIAGNOSIS — G5621 Lesion of ulnar nerve, right upper limb: Secondary | ICD-10-CM | POA: Diagnosis not present

## 2021-05-25 DIAGNOSIS — R197 Diarrhea, unspecified: Secondary | ICD-10-CM

## 2021-05-25 DIAGNOSIS — M720 Palmar fascial fibromatosis [Dupuytren]: Secondary | ICD-10-CM | POA: Diagnosis not present

## 2021-05-25 DIAGNOSIS — G5601 Carpal tunnel syndrome, right upper limb: Secondary | ICD-10-CM | POA: Diagnosis not present

## 2021-05-25 DIAGNOSIS — K529 Noninfective gastroenteritis and colitis, unspecified: Secondary | ICD-10-CM | POA: Diagnosis not present

## 2021-05-25 DIAGNOSIS — M13841 Other specified arthritis, right hand: Secondary | ICD-10-CM | POA: Diagnosis not present

## 2021-05-25 DIAGNOSIS — M19021 Primary osteoarthritis, right elbow: Secondary | ICD-10-CM | POA: Diagnosis not present

## 2021-05-25 MED ORDER — LOPERAMIDE HCL 2 MG PO CAPS: 2.0000 mg | ORAL_CAPSULE | Freq: Two times a day (BID) | ORAL | 0 refills | Status: DC | PRN

## 2021-05-25 MED ORDER — ONDANSETRON 8 MG PO TBDP: 8.0000 mg | ORAL_TABLET | Freq: Three times a day (TID) | ORAL | 0 refills | Status: DC | PRN

## 2021-05-25 NOTE — Discharge Instructions (Addendum)

## 2021-05-25 NOTE — ED Provider Notes (Signed)
?[place] ? ? ?MRN: [id number] DOB: 12/25/1940 ? ?Subjective:  ? ?Carlos Dawson is a 81 y.o. male presenting for 2 week history of persistent sinus congestion, bilateral ear fullness, right ear is worse. No drainage from ears. Has had some coughing that is subsiding. His sinuses are also improving. No new chest pain, shob, wheezing. Last night started having stomach cramps, diarrhea, has had more than 10 loose stools and easily has incontinence with straining the slightest bit. Has a history of gastric lapband May 2009. No bloody stools, history of GI conditions. Has a history of cholecystectomy, appendectomy. Recently had surgery of right SI joint in mid March. No hospitalizations. Has taken 1 doxycycline capsule today hoping it would help. Has allergic rhinitis, takes Zyrtec occasionally. Also has OSA and is on CPAP.  ? ?No current facility-administered medications for this encounter. ? ?Current Outpatient Medications:  ?  acetaminophen (TYLENOL) 500 MG tablet, Take 1,000 mg by mouth every 8 (eight) hours as needed for mild pain or headache. , Disp: , Rfl:  ?  albuterol (VENTOLIN HFA) 108 (90 Base) MCG/ACT inhaler, Inhale 2 puffs into the lungs every 4 (four) hours as needed for wheezing or shortness of breath., Disp: 18 g, Rfl: 1 ?  atorvastatin (LIPITOR) 10 MG tablet, Take 5 mg by mouth daily at 6 PM. , Disp: , Rfl:  ?  augmented betamethasone dipropionate (DIPROLENE-AF) 0.05 % cream, Apply 1 application topically 2 (two) times daily as needed for itching., Disp: , Rfl: 3 ?  Azelastine-Fluticasone 137-50 MCG/ACT SUSP, PLACE 1 PUFF INTO THE NOSE IN THE MORNING AND AT BEDTIME., Disp: 23 g, Rfl: 5 ?  benzonatate (TESSALON) 200 MG capsule, Take 1 capsule (200 mg total) by mouth 3 (three) times daily as needed for cough., Disp: 30 capsule, Rfl: 1 ?  budesonide-formoterol (SYMBICORT) 160-4.5 MCG/ACT inhaler, Inhale 2 puffs into the lungs 2 (two) times daily. As needed, Disp: , Rfl:  ?  celecoxib  (CELEBREX) 200 MG capsule, Take 200 mg by mouth 2 (two) times daily as needed for moderate pain., Disp: , Rfl: 0 ?  Cholecalciferol (VITAMIN D-3) 5000 units TABS, Take 5,000 Units by mouth daily., Disp: , Rfl:  ?  Coenzyme Q10 100 MG capsule, Take 100 mg by mouth daily. , Disp: , Rfl:  ?  furosemide (LASIX) 80 MG tablet, Take 1 tablet (80 mg total) by mouth daily., Disp: , Rfl:  ?  JARDIANCE 25 MG TABS tablet, Take 1 tablet by mouth daily., Disp: , Rfl:  ?  losartan (COZAAR) 25 MG tablet, TAKE 1 TABLET BY MOUTH EVERY DAY, Disp: 90 tablet, Rfl: 3 ?  methocarbamol (ROBAXIN) 500 MG tablet, Take 1 tablet (500 mg total) by mouth every 8 (eight) hours as needed for muscle spasms., Disp: 60 tablet, Rfl: 1 ?  metoprolol tartrate (LOPRESSOR) 25 MG tablet, Take 12.5 mg by mouth 2 (two) times daily., Disp: , Rfl:  ?  nitroGLYCERIN (NITROSTAT) 0.4 MG SL tablet, Place 1 tablet (0.4 mg total) under the tongue every 5 (five) minutes as needed for chest pain (up to the tablets max. IF you are still have Chest pain after the 3rd tablet call 911.)., Disp: 25 tablet, Rfl: 5 ?  pantoprazole (PROTONIX) 20 MG tablet, Take 20 mg by mouth daily., Disp: , Rfl:  ?  Polyethyl Glycol-Propyl Glycol (SYSTANE OP), Place 1 drop into both eyes daily as needed (for dry eyes). , Disp: , Rfl:  ?  potassium chloride SA (KLOR-CON M20) 20 MEQ  tablet, Taking two tablet by mouth in the am and 1 tablet in the evening, Disp: , Rfl:  ?  psyllium (METAMUCIL) 58.6 % packet, Take 1 packet by mouth daily., Disp: , Rfl:  ?  rivaroxaban (XARELTO) 20 MG TABS tablet, Take 1 tablet (20 mg total) by mouth daily with supper., Disp: 30 tablet, Rfl:  ?  sildenafil (VIAGRA) 100 MG tablet, Take 1 tablet (100 mg total) by mouth as needed for erectile dysfunction., Disp: 10 tablet, Rfl: 6 ?  Skin Protectants, Misc. (EUCERIN) cream, Apply 1 application topically as needed for dry skin., Disp: , Rfl:  ?  spironolactone (ALDACTONE) 25 MG tablet, Take 25 mg by mouth daily., Disp:  , Rfl:  ?  tamsulosin (FLOMAX) 0.4 MG CAPS, Take 0.4 mg by mouth every evening. , Disp: , Rfl:  ?  topiramate (TOPAMAX) 25 MG capsule, Take 50 mg by mouth 2 (two) times daily. , Disp: , Rfl:   ? ?Allergies  ?Allergen Reactions  ? Other   ?  Other reaction(s): cough, Other ?Other reaction(s): cough ? ?Other reaction(s): wt gain  ? Ace Inhibitors Cough  ? Adhesive [Tape] Itching and Rash  ? Amoxicillin-Pot Clavulanate Other (See Comments)  ?  Other reaction(s): GI Upset (intolerance)  ? Codeine Nausea Only  ?  Other reaction(s): nausea ?Other reaction(s): nausea ?Other reaction(s): nausea ?  ? Latex Itching, Rash and Other (See Comments)  ?  Bandaids  ? Metformin Diarrhea  ? Morphine Itching  ? Nifedipine Other (See Comments)  ?  Other reaction(s): leg edema  ? Quinolones Other (See Comments)  ?  Patient was warned about not using Cipro and similar antibiotics. ?Recent studies have raised concern that fluoroquinolone antibiotics could be associated with an increased risk of aortic aneurysm ?Fluoroquinolones have non-antimicrobial properties that might jeopardise the integrity of the extracellular matrix of the vascular wall ?In a  propensity score matched cohort study in [place], there was a 66% increased rate of aortic aneurysm or dissection associated with oral fluoroquinolone use, compared wit  ? ? ?Past Medical History:  ?Diagnosis Date  ? Anticoagulant long-term use   ? Aortic root enlargement ([place])   ? Ascending aortic aneurysm ([place])   ? recent scan in October 2012 showing no change; followed by Dr. Servando Snare  ? ASCVD (arteriosclerotic cardiovascular disease)   ? Prior BMS to the 2nd OM in September 2012; with repeat cath in October showing patency  ? CAD (coronary artery disease)   ? a. s/p BMS to 2nd OM in Sept 2012; b. LexiScan Myoview (12/2012):  Inf infarct; bowel and motion artifact make study difficult to interpret; no ischemia; not gated; Low Risk  ? CHF (congestive heart failure) ([place])   ? no recent  issues 10/13/14  ? Chronic back pain   ? "all over my back" (05/11/2017)  ? Colonic polyp   ? Contact lens/glasses fitting   ? Diastolic dysfunction   ? DVT (deep venous thrombosis) ([place])   ? ?LLE  ? Frequent headaches   ? "probably weekly" (05/11/2017)  ? Generalized headaches   ? neck stenosis  ? GERD (gastroesophageal reflux disease)   ? Hearing loss   ? Hearing loss   ? more so on left  ? Hemorrhoids   ? History of stomach ulcers   ? Hypertension   ? IBS (irritable bowel syndrome)   ? LVH (left ventricular hypertrophy)   ? Mild intermittent asthma   ? OA (osteoarthritis)   ? "all over" (05/11/2017)  ?  Obesities, morbid ([place])   ? OSA (obstructive sleep apnea)   ? PSG 03/30/97 AHI 21, BPAP 13/9  ? OSA on CPAP   ? PAF (paroxysmal atrial fibrillation) ([place])   ? a. on Xarelto b. s/p DCCV in 08/2016; b. Tikosyn failed 04/16/17 with plans for Multaq and possible Afib ablation with Dr. Rayann Heman  ? Pneumonia   ? 'several times" (05/11/2017)  ? Prostate CA [facility])   ? Oncologist  DR. Daralene Milch [facility] dx 09/24/14, undetermined tx   prostate; S/P "radiation and hormone injections"  ? Pulmonary embolism ([place]) 2008  ? "both lungs"  ? SOB (shortness of breath)   ? on excertion  ? Thoracic aortic aneurysm ([place])   ? Aortic Size Index=     5.0    /Body surface area is 2.43 meters squared. = 2.05  < 2.75 cm/m2      4% risk per year 2.75 to 4.25          8% risk per year > 4.25 cm/m2    20% risk per year   Stable aneurysmal dilation of the ascending aorta with maximum AP diameter of 4.8 cm. Stable area of narrowing of the proximal most portion of the descending aorta measuring 2 cm., previously identified as an area of coarctation. No evidence of aortic dissection.  Coronary artery disease.  Normal appearance of the lungs.   Electronically Signed   By: Fidela Salisbury M.D.   On: 10/01/2014 08:50    ? Type II diabetes mellitus ([place])   ? metphormin, average 154 dx 2017  ?  ? ?Past Surgical History:  ?Procedure Laterality Date  ? ACHILLES  TENDON REPAIR Bilateral   ? AORTIC ARCH ANGIOGRAPHY N/A 03/13/2017  ? Procedure: AORTIC ARCH ANGIOGRAPHY;  Surgeon: Martinique, [name], MD;  Location: [facility];  Service: Cardiovascular;  Laterality: N/A

## 2021-05-25 NOTE — ED Triage Notes (Signed)
Pt c/o waking up at 0330 this morning with stomach cramps, diarrhea, fever at home. States has been having "accidents." Says his right ear is "stopped up." Also c/o headache, muscle aches, SOB. States took 1 doxycycline before coming to UC today hoping it would help pt feel better but it did not.  ?

## 2021-05-26 DIAGNOSIS — Z20822 Contact with and (suspected) exposure to covid-19: Secondary | ICD-10-CM | POA: Diagnosis not present

## 2021-05-27 DIAGNOSIS — M48062 Spinal stenosis, lumbar region with neurogenic claudication: Secondary | ICD-10-CM | POA: Diagnosis not present

## 2021-05-27 DIAGNOSIS — M961 Postlaminectomy syndrome, not elsewhere classified: Secondary | ICD-10-CM | POA: Diagnosis not present

## 2021-05-27 DIAGNOSIS — M47817 Spondylosis without myelopathy or radiculopathy, lumbosacral region: Secondary | ICD-10-CM | POA: Diagnosis not present

## 2021-06-01 DIAGNOSIS — M47816 Spondylosis without myelopathy or radiculopathy, lumbar region: Secondary | ICD-10-CM | POA: Diagnosis not present

## 2021-06-03 DIAGNOSIS — I10 Essential (primary) hypertension: Secondary | ICD-10-CM | POA: Diagnosis not present

## 2021-06-03 DIAGNOSIS — J4530 Mild persistent asthma, uncomplicated: Secondary | ICD-10-CM | POA: Diagnosis not present

## 2021-06-03 DIAGNOSIS — K219 Gastro-esophageal reflux disease without esophagitis: Secondary | ICD-10-CM | POA: Diagnosis not present

## 2021-06-03 DIAGNOSIS — I480 Paroxysmal atrial fibrillation: Secondary | ICD-10-CM | POA: Diagnosis not present

## 2021-06-03 DIAGNOSIS — N400 Enlarged prostate without lower urinary tract symptoms: Secondary | ICD-10-CM | POA: Diagnosis not present

## 2021-06-03 DIAGNOSIS — E785 Hyperlipidemia, unspecified: Secondary | ICD-10-CM | POA: Diagnosis not present

## 2021-06-03 DIAGNOSIS — I5030 Unspecified diastolic (congestive) heart failure: Secondary | ICD-10-CM | POA: Diagnosis not present

## 2021-06-03 DIAGNOSIS — E0822 Diabetes mellitus due to underlying condition with diabetic chronic kidney disease: Secondary | ICD-10-CM | POA: Diagnosis not present

## 2021-06-09 DIAGNOSIS — R972 Elevated prostate specific antigen [PSA]: Secondary | ICD-10-CM | POA: Diagnosis not present

## 2021-06-09 DIAGNOSIS — C61 Malignant neoplasm of prostate: Secondary | ICD-10-CM | POA: Diagnosis not present

## 2021-06-09 DIAGNOSIS — N529 Male erectile dysfunction, unspecified: Secondary | ICD-10-CM | POA: Diagnosis not present

## 2021-06-28 DIAGNOSIS — M47816 Spondylosis without myelopathy or radiculopathy, lumbar region: Secondary | ICD-10-CM | POA: Diagnosis not present

## 2021-07-06 ENCOUNTER — Ambulatory Visit (HOSPITAL_COMMUNITY)
Admission: RE | Admit: 2021-07-06 | Discharge: 2021-07-06 | Disposition: A | Discharge status: Home / Self Care | Payer: Medicare Other | Source: Ambulatory Visit | Attending: Nurse Practitioner | Admitting: Nurse Practitioner

## 2021-07-06 VITALS — BP 106/56 | HR 48 | Ht 67.0 in | Wt 252.8 lb

## 2021-07-06 DIAGNOSIS — Z7901 Long term (current) use of anticoagulants: Secondary | ICD-10-CM | POA: Insufficient documentation

## 2021-07-06 DIAGNOSIS — I110 Hypertensive heart disease with heart failure: Secondary | ICD-10-CM | POA: Insufficient documentation

## 2021-07-06 DIAGNOSIS — I4819 Other persistent atrial fibrillation: Secondary | ICD-10-CM | POA: Diagnosis not present

## 2021-07-06 DIAGNOSIS — R001 Bradycardia, unspecified: Secondary | ICD-10-CM | POA: Insufficient documentation

## 2021-07-06 DIAGNOSIS — D6869 Other thrombophilia: Secondary | ICD-10-CM

## 2021-07-06 DIAGNOSIS — I480 Paroxysmal atrial fibrillation: Secondary | ICD-10-CM | POA: Diagnosis not present

## 2021-07-06 DIAGNOSIS — I509 Heart failure, unspecified: Secondary | ICD-10-CM | POA: Insufficient documentation

## 2021-07-06 DIAGNOSIS — R609 Edema, unspecified: Secondary | ICD-10-CM | POA: Insufficient documentation

## 2021-07-06 DIAGNOSIS — M1380 Other specified arthritis, unspecified site: Secondary | ICD-10-CM | POA: Diagnosis not present

## 2021-07-06 NOTE — Progress Notes (Signed)
Primary Care Physician: Josetta Huddle, MD Referring Physician: Dr. Rubie Maid is a 81 y.o. male with a h/o  persistent afib/flutter with afib ablation  in 04/2017. He has no  complaints today except for  some intermittent swelling of his feet. He reports that he is not compliant  with a low salt diet. "I like salt." He has been told he cat take extra laix as needed by Dr. Martinique. He hs not noted any irregular heart beat.  EKG today shows Sinus brady.   F/u in afib clinic, 07/06/21. He reports that he is doing well. EKG shows sinus brady at 48 bpm, however, Kardia strips reviewed and he has HR's in SR in  the 60's to 80's. One strip recently read afib but it was artifact with SR and PC's. He remains on xarelto at the correct dose per crcl cal at 86. No bleeding concerns, but pain clinic one week ago put him on Celebrex 200 mg 2x a day for back  pain 2/2 to arthritis. He states that tylenol nor tramadol  touch his pain. We dicussed with xarelto this could predispose him for a GI bleed and not the best choice to take  daily together. I discussed Watchman with him  and he is interested so he can better manage his pain. In the interim, he will stop daily celebrex.    Today, he denies symptoms of palpitations, chest pain, shortness of breath, orthopnea, PND, lower extremity edema, dizziness, presyncope, syncope, or neurologic sequela. The patient is tolerating medications without difficulties and is otherwise without complaint today.   Past Medical History:  Diagnosis Date   Anticoagulant long-term use    Aortic root enlargement ([facility])    Ascending aortic aneurysm ([place])    recent scan in October 2012 showing no change; followed by Dr. Servando Snare   ASCVD (arteriosclerotic cardiovascular disease)    Prior BMS to the 2nd OM in September 2012; with repeat cath in October showing patency   CAD (coronary artery disease)    a. s/p BMS to 2nd OM in Sept 2012; b. LexiScan Myoview (12/2012):  Inf  infarct; bowel and motion artifact make study difficult to interpret; no ischemia; not gated; Low Risk   CHF (congestive heart failure) ([facility])    no recent issues 10/13/14   Chronic back pain    "all over my back" (05/11/2017)   Colonic polyp    Contact lens/glasses fitting    Diastolic dysfunction    DVT (deep venous thrombosis) ([place])    ?LLE   Frequent headaches    "probably weekly" (05/11/2017)   Generalized headaches    neck stenosis   GERD (gastroesophageal reflux disease)    Hearing loss    Hearing loss    more so on left   Hemorrhoids    History of stomach ulcers    Hypertension    IBS (irritable bowel syndrome)    LVH (left ventricular hypertrophy)    Mild intermittent asthma    OA (osteoarthritis)    "all over" (05/11/2017)   Obesities, morbid ([facility])    OSA (obstructive sleep apnea)    PSG 03/30/97 AHI 21, BPAP 13/9   OSA on CPAP    PAF (paroxysmal atrial fibrillation) ([place])    a. on Xarelto b. s/p DCCV in 08/2016; b. Tikosyn failed 04/16/17 with plans for Multaq and possible Afib ablation with Dr. Rayann Heman   Pneumonia    'several times" (05/11/2017)   Prostate CA ([place])  Oncologist  DR. Daralene [facility] dx 09/24/14, undetermined tx   prostate; S/P "radiation and hormone injections"   Pulmonary embolism ([place]) 2008   "both lungs"   SOB (shortness of breath)    on excertion   Thoracic aortic aneurysm ([facility])    Aortic Size Index=     5.0    /Body surface area is 2.43 meters squared. = 2.05  < 2.75 cm/m2      4% risk per year 2.75 to 4.25          8% risk per year > 4.25 cm/m2    20% risk per year   Stable aneurysmal dilation of the ascending aorta with maximum AP diameter of 4.8 cm. Stable area of narrowing of the proximal most portion of the descending aorta measuring 2 cm., previously identified as an area of coarctation. No evidence of aortic dissection.  Coronary artery disease.  Normal appearance of the lungs.   Electronically Signed   By: Fidela Salisbury M.D.   On:  10/01/2014 08:50     Type II diabetes mellitus ([place])    metphormin, average 154 dx 2017   Past Surgical History:  Procedure Laterality Date   ACHILLES TENDON REPAIR Bilateral    AORTIC ARCH ANGIOGRAPHY N/A 03/13/2017   Procedure: AORTIC ARCH ANGIOGRAPHY;  Surgeon: Martinique, [name], MD;  Location: [facility];  Service: Cardiovascular;  Laterality: N/A;   APPENDECTOMY     ATRIAL FIBRILLATION ABLATION  05/11/2017   ATRIAL FIBRILLATION ABLATION N/A 05/11/2017   Procedure: ATRIAL FIBRILLATION ABLATION;  Surgeon: [name], MD;  Location: [facility];  Service: Cardiovascular;  Laterality: N/A;   BACK SURGERY     "I've had 7 back and 1 neck ORs" (05/11/2017)   BIOPSY  03/14/2018   Procedure: BIOPSY;  Surgeon: Ronnette Juniper, MD;  Location: [facility];  Service: Gastroenterology;;  EGD and Colon   CARDIAC CATHETERIZATION  2006   CARPAL TUNNEL RELEASE Bilateral    LEFT   CATARACT EXTRACTION W/ INTRAOCULAR LENS  IMPLANT, BILATERAL Bilateral    CERVICAL SPINE SURGERY  06/02/2010   lower back and neck   COLONOSCOPY N/A 03/14/2018   Procedure: COLONOSCOPY;  Surgeon: Ronnette Juniper, MD;  [facility]: [facility];  Service: Gastroenterology;  Laterality: N/A;   COLONOSCOPY WITH PROPOFOL N/A 12/29/2014   Procedure: COLONOSCOPY WITH PROPOFOL;  Surgeon: Garlan Fair, MD;  Location: [facility];  Service: Endoscopy;  Laterality: N/A;   CORONARY ANGIOPLASTY WITH STENT PLACEMENT  October 2012   CORONARY STENT PLACEMENT  Sept 2012   2nd OM with BMS   ESOPHAGOGASTRODUODENOSCOPY N/A 03/14/2018   Procedure: ESOPHAGOGASTRODUODENOSCOPY (EGD);  Surgeon: Ronnette Juniper, MD;  Location: [facility];  Service: Gastroenterology;  Laterality: N/A;   HEMORROIDECTOMY     LAMINECTOMY  05/30/2012   L 4 L5   LAMINECTOMY WITH POSTERIOR LATERAL ARTHRODESIS LEVEL 3 N/A 10/18/2016   Procedure: Posterior Lateral Fusion - Lumbar One-Four, segmental instrumentation Lumbar One-Five,  decompression,;  Surgeon: Eustace Moore, MD;  Location: [facility];  Service: Neurosurgery;  Laterality: N/A;   LAPAROSCOPIC CHOLECYSTECTOMY     LAPAROSCOPIC GASTRIC BANDING     LEFT AND RIGHT HEART CATHETERIZATION WITH CORONARY ANGIOGRAM N/A 05/07/2014   Procedure: LEFT AND RIGHT HEART CATHETERIZATION WITH CORONARY ANGIOGRAM;  Surgeon: [name] Martinique, MD;  Location: [facility] LAB;  Service: Cardiovascular;  Laterality: N/A;   LEFT HEART CATH AND CORONARY ANGIOGRAPHY N/A 03/13/2017   Procedure: LEFT HEART CATH AND CORONARY ANGIOGRAPHY;  Surgeon:  Martinique, [name], MD;  Location: [facility];  Service: Cardiovascular;  Laterality: N/A;   LUMBAR LAMINECTOMY/DECOMPRESSION MICRODISCECTOMY N/A 05/04/2016   Procedure: Laminectomy and Foraminotomy - Thoracic twelve-Lumbar one -Posterior Fusion Lumbar one-two;  Surgeon: Eustace Moore, MD;  Location: [facility];  Service: Neurosurgery;  Laterality: N/A;   POLYPECTOMY  03/14/2018   Procedure: POLYPECTOMY;  Surgeon: Ronnette Juniper, MD;  [facility];  Service: Gastroenterology;;   POSTERIOR LUMBAR FUSION  10/18/2016   SHOULDER OPEN ROTATOR CUFF REPAIR Bilateral    TONSILLECTOMY AND ADENOIDECTOMY     TOTAL HIP ARTHROPLASTY Right 10/18/2018   Procedure: RIGHT TOTAL HIP ARTHROPLASTY ANTERIOR APPROACH;  Surgeon: Mcarthur Rossetti, MD;  [facility]: [facility];  Service: Orthopedics;  Laterality: Right;   TRIGGER FINGER RELEASE     LEFT   UVULOPALATOPHARYNGOPLASTY     VASECTOMY      Current Outpatient Medications  Medication Sig Dispense Refill   losartan (COZAAR) 25 MG tablet TAKE 1 TABLET BY MOUTH EVERY DAY 90 tablet 3   acetaminophen (TYLENOL) 500 MG tablet Take 1,000 mg by mouth every 8 (eight) hours as needed for mild pain or headache.      albuterol (VENTOLIN HFA) 108 (90 Base) MCG/ACT inhaler Inhale 2 puffs into the lungs every 4 (four) hours as needed for wheezing or shortness of breath. 18 g 1   atorvastatin (LIPITOR) 10 MG tablet Take 5 mg by mouth daily at 6 PM.      augmented  betamethasone dipropionate (DIPROLENE-AF) 0.05 % cream Apply 1 application topically 2 (two) times daily as needed for itching.  3   Azelastine-Fluticasone 137-50 MCG/ACT SUSP PLACE 1 PUFF INTO THE NOSE IN THE MORNING AND AT BEDTIME. 23 g 5   benzonatate (TESSALON) 200 MG capsule Take 1 capsule (200 mg total) by mouth 3 (three) times daily as needed for cough. 30 capsule 1   budesonide-formoterol (SYMBICORT) 160-4.5 MCG/ACT inhaler Inhale 2 puffs into the lungs 2 (two) times daily. As needed     celecoxib (CELEBREX) 200 MG capsule Take 200 mg by mouth 2 (two) times daily as needed for moderate pain.  0   Cholecalciferol (VITAMIN D-3) 5000 units TABS Take 5,000 Units by mouth daily.     Coenzyme Q10 100 MG capsule Take 100 mg by mouth daily.      furosemide (LASIX) 80 MG tablet Take 1 tablet (80 mg total) by mouth daily.     JARDIANCE 25 MG TABS tablet Take 1 tablet by mouth daily.     loperamide (IMODIUM) 2 MG capsule Take 1 capsule (2 mg total) by mouth 2 (two) times daily as needed for diarrhea or loose stools. 14 capsule 0   methocarbamol (ROBAXIN) 500 MG tablet Take 1 tablet (500 mg total) by mouth every 8 (eight) hours as needed for muscle spasms. 60 tablet 1   metoprolol tartrate (LOPRESSOR) 25 MG tablet Take 12.5 mg by mouth 2 (two) times daily.     nitroGLYCERIN (NITROSTAT) 0.4 MG SL tablet Place 1 tablet (0.4 mg total) under the tongue every 5 (five) minutes as needed for chest pain (up to the tablets max. IF you are still have Chest pain after the 3rd tablet call 911.). 25 tablet 5   ondansetron (ZOFRAN-ODT) 8 MG disintegrating tablet Take 1 tablet (8 mg total) by mouth every 8 (eight) hours as needed for nausea or vomiting. 20 tablet 0   pantoprazole (PROTONIX) 20 MG tablet Take 20 mg by mouth daily.  Polyethyl Glycol-Propyl Glycol (SYSTANE OP) Place 1 drop into both eyes daily as needed (for dry eyes).      potassium chloride SA (KLOR-CON M20) 20 MEQ tablet Taking two tablet by mouth  in the am and 1 tablet in the evening     psyllium (METAMUCIL) 58.6 % packet Take 1 packet by mouth daily.     rivaroxaban (XARELTO) 20 MG TABS tablet Take 1 tablet (20 mg total) by mouth daily with supper. 30 tablet    sildenafil (VIAGRA) 100 MG tablet Take 1 tablet (100 mg total) by mouth as needed for erectile dysfunction. 10 tablet 6   Skin Protectants, Misc. (EUCERIN) cream Apply 1 application topically as needed for dry skin.     spironolactone (ALDACTONE) 25 MG tablet Take 25 mg by mouth daily.     tamsulosin (FLOMAX) 0.4 MG CAPS Take 0.4 mg by mouth every evening.      topiramate (TOPAMAX) 25 MG capsule Take 50 mg by mouth 2 (two) times daily.      No current facility-administered medications for this encounter.    Allergies  Allergen Reactions   Other     Other reaction(s): cough, Other Other reaction(s): cough  Other reaction(s): wt gain   Lotensin [Benazepril] Cough   Tofranil [Imipramine]     Weight gain   Ace Inhibitors Cough   Adhesive [Tape] Itching and Rash   Amoxicillin-Pot Clavulanate Other (See Comments)    Other reaction(s): GI Upset (intolerance)   Codeine Nausea Only    Other reaction(s): nausea Other reaction(s): nausea Other reaction(s): nausea    Latex Itching, Rash and Other (See Comments)    Bandaids   Metformin Diarrhea   Morphine Itching   Nifedipine Other (See Comments)    Other reaction(s): leg edema   Quinolones Other (See Comments)    Patient was warned about not using Cipro and similar antibiotics. Recent studies have raised concern that fluoroquinolone antibiotics could be associated with an increased risk of aortic aneurysm Fluoroquinolones have non-antimicrobial properties that might jeopardise the integrity of the extracellular matrix of the vascular wall In a  propensity score matched cohort study in [place], there was a 66% increased rate of aortic aneurysm or dissection associated with oral fluoroquinolone use, compared wit    Social  History   Socioeconomic History   Marital status: Married    Spouse name: Not on file   Number of children: 3   Years of education: Not on file   Highest education level: Not on file  Occupational History   Occupation: Retired from Scientist, clinical (histocompatibility and immunogenetics): RETIRED  Tobacco Use   Smoking status: Former    Packs/day: 1.50    Years: 30.00    Total pack years: 45.00    Types: Cigarettes    Start date: 1958    Quit date: 01/24/1992    Years since quitting: 29.4   Smokeless tobacco: Never  Vaping Use   Vaping Use: Never used  Substance and Sexual Activity   Alcohol use: No   Drug use: No   Sexual activity: Not Currently  Other Topics Concern   Not on file  Social History Narrative   Not on file   Social Determinants of Health   Financial Resource Strain: Not on file  Food Insecurity: Not on file  Transportation Needs: Not on file  Physical Activity: Not on file  Stress: Not on file  Social Connections: Not on file  Intimate Partner Violence: Not on file  Family History  Problem Relation Age of Onset   Heart disease Mother    Diabetes Mother    Other Mother        stent placement   Emphysema Father 45   Heart attack Sister     ROS- All systems are reviewed and negative except as per the HPI above  Physical Exam: Vitals:   07/06/21 0954  Weight: 114.7 kg  Height: '5\' 7"'$  (1.702 m)   Wt Readings from Last 3 Encounters:  07/06/21 114.7 kg  03/29/21 113.9 kg  02/16/21 113.5 kg    Labs: Lab Results  Component Value Date   NA 141 03/10/2021   K 4.5 03/10/2021   CL 108 (H) 03/10/2021   CO2 23 03/10/2021   GLUCOSE 130 (H) 03/10/2021   BUN 22 03/10/2021   CREATININE 1.05 03/10/2021   CALCIUM 8.8 03/10/2021   MG 2.2 04/18/2017   Lab Results  Component Value Date   INR 1.3 (H) 03/08/2017   Lab Results  Component Value Date   CHOL 118 01/07/2013   HDL 40 01/07/2013   LDLCALC 51 01/07/2013   TRIG 133 01/07/2013     GEN- The patient is well appearing,  alert and oriented x 3 today.   Head- normocephalic, atraumatic Eyes-  Sclera clear, conjunctiva pink Ears- hearing intact Oropharynx- clear Neck- supple, no JVP Lymph- no cervical lymphadenopathy Lungs- Clear to ausculation bilaterally, normal work of breathing Heart- Regular rate and rhythm, no murmurs, rubs or gallops, PMI not laterally displaced GI- soft, NT, ND, + BS Extremities- no clubbing, cyanosis, or edema MS- no significant deformity or atrophy Skin- no rash or lesion Psych- euthymic mood, full affect Neuro- strength and sensation are intact  EKG- Vent. rate 48 BPM PR interval 236 ms QRS duration 124 ms QT/QTcB 442/394 ms P-R-T axes 68 53 68 Sinus bradycardia with 1st degree A-V block RSR' or QR pattern in V1 suggests right ventricular conduction delay Nonspecific T wave abnormality Abnormal ECG When compared with ECG of 04-Jan-2021 10:43, PREVIOUS ECG IS PRESENT Epic records reviewed    Assessment and Plan: 1. Persistent  afib He appears to be maintaining SR   Continue  metoprolol tartrate at 12.5 mg bid   2. CHA2DS2VASc score of 6 Continue  xarelto 20 mg daily  3. HTN Stable No  change   4. Arthritic pain Was placed on celebrex 2x a day by pain clinic recently Discussion how taking this and xarelto would put him at risk of GI bleed He states Tylenol nor tramadol are effective to control pain Will refer to Dr. Quentin Ore to discuss if he might be a Watchman candidate so he can take antiinflammatories for control of arthritis  pain In the interim, he will stop celebrex    5. Pedal edema Chronic  High salt  diet  Discussed to limit salt   6. Bradycardia Seen  on ekg today but Kardia strips show appropriate heart rates in SR   Refer to Dr. Quentin Ore  F/u with Dr. Martinique as scheduled   Geroge Baseman. Auriana Scalia, [facility] [address] [place], [place] [address] [phone]

## 2021-07-18 DIAGNOSIS — M47816 Spondylosis without myelopathy or radiculopathy, lumbar region: Secondary | ICD-10-CM | POA: Diagnosis not present

## 2021-07-19 DIAGNOSIS — I480 Paroxysmal atrial fibrillation: Secondary | ICD-10-CM | POA: Diagnosis not present

## 2021-07-19 DIAGNOSIS — D6869 Other thrombophilia: Secondary | ICD-10-CM | POA: Diagnosis not present

## 2021-07-19 DIAGNOSIS — I719 Aortic aneurysm of unspecified site, without rupture: Secondary | ICD-10-CM | POA: Diagnosis not present

## 2021-07-19 DIAGNOSIS — I10 Essential (primary) hypertension: Secondary | ICD-10-CM | POA: Diagnosis not present

## 2021-07-19 DIAGNOSIS — E785 Hyperlipidemia, unspecified: Secondary | ICD-10-CM | POA: Diagnosis not present

## 2021-07-19 DIAGNOSIS — E0822 Diabetes mellitus due to underlying condition with diabetic chronic kidney disease: Secondary | ICD-10-CM | POA: Diagnosis not present

## 2021-07-19 DIAGNOSIS — I2510 Atherosclerotic heart disease of native coronary artery without angina pectoris: Secondary | ICD-10-CM | POA: Diagnosis not present

## 2021-07-19 DIAGNOSIS — E1169 Type 2 diabetes mellitus with other specified complication: Secondary | ICD-10-CM | POA: Diagnosis not present

## 2021-07-19 DIAGNOSIS — I509 Heart failure, unspecified: Secondary | ICD-10-CM | POA: Diagnosis not present

## 2021-07-19 DIAGNOSIS — R252 Cramp and spasm: Secondary | ICD-10-CM | POA: Diagnosis not present

## 2021-07-19 DIAGNOSIS — N183 Chronic kidney disease, stage 3 unspecified: Secondary | ICD-10-CM | POA: Diagnosis not present

## 2021-07-19 DIAGNOSIS — N1831 Chronic kidney disease, stage 3a: Secondary | ICD-10-CM | POA: Diagnosis not present

## 2021-07-19 DIAGNOSIS — I700 Atherosclerosis of aorta: Secondary | ICD-10-CM | POA: Diagnosis not present

## 2021-07-29 NOTE — Progress Notes (Unsigned)
Cardiology Office Note    Date:  07/29/2021   ID:  IBN STIEF, DOB 1940/09/09, MRN [id number]  PCP:  Josetta Huddle, MD  Cardiologist: Dr. Jovon Winterhalter [place]   No chief complaint on file.   History of Present Illness:    Carlos Dawson is a 81 y.o. male with past medical history of CAD (s/p BMS to 2nd OM in 2012, low-risk NST in 12/2012), Cath in 2019 with nonobstructive disease, PAF (on Xarelto), chronic diastolic CHF, prior PE, HTN, HLD, Type 2 DM, and known thoracic aortic aneurysm who is seen for follow up.  He was admitted from 8/23 - 09/15/2016 for evaluation of chest discomfort and palpitations. His initial EKG showed that he was in atrial fibrillation with RVR. He reported good compliance with his Xarelto and denied missing any doses, therefore a successful DCCV was performed. He became hypotensive with SBP in the 80's following the procedure, therefore he was admitted overnight for further observation. His Losartan, Spironolactone, and Lasix were held at the time of discharge with plans to resume at follow-up if BP allowed. A CTA was also obtained during admission to rule out a recurrent PE and was negative for a PE but did show a stable 5.0 cm ascending thoracic aortic aneurysm. Antihypertensive therapy was resumed.   On follow up in February 2019   he noted increased symptoms of dyspnea on exertion and chest pressure for 3 months. Some improvement with inhalers.  No increase in edema or weight.  Oxygen levels were good. He does use CPAP. Myoview study was mildly abnormal with inferoapical reversible defect. EF 61%. Echo showed normal EF with moderate pulmonary HTN. Subsequent cardiac cath showed no obstructive CAD and no AI. Unable to cross AV due to distortion of the aortic root.  In April 2019 he developed increased symptoms of arrhythmia despite Tikosyn. He underwent EPS by Dr. Rayann Heman on 05/11/17. He had Afib and atrial flutter ablations. When seen in follow up  he had significant  improvement in Afib symptoms and some improvement in dyspnea. Multaq was discontinued.   On 09/26/17 he had CPX to evaluate his dyspnea. This showed predominant limitation by obesity and deconditioning. He was seen by Dr. Halford Chessman who recommended repeat Echo and if pulmonary pressures still elevated to consider right heart cath. Of note right heart cath in 2016 demonstrated mild pulmonary HTN with elevated PCWP c/w diastolic dysfunction. Diuretic therapy was increased at that time without significant clinical improvement.  He underwent THR in September 2020. Post op course complicated by blood loss anemia. He did develop Afib with rates into the 120s. Since then follow up in the Afib clinic demonstrated return of NSR. Last seen in AFib clinic on 11/24/19 and in NSR with PACs.   Last seen in Afib clinic in June and maintaining NSR. Was referred to Dr Quentin Ore for consideration of a Watchman device so he could come off anticoagulation and be able to take anti-inflammatories to manage his pain.  On follow up today he notes that he had Covid last July; he recovered but just can't get rid of cough. Has tried a number of cough meds as well as 2 courses of antibiotics and steroids. Notes his current CPAP doesn't work as well as his old one did. Notes he gets SOB with almost any activity. Has some swelling. On lasix 80 mg daily and aldactone. BP has been OK.    Past Medical History:  Diagnosis Date   Anticoagulant long-term use  Aortic root enlargement (HCC)    Ascending aortic aneurysm ([place])    recent scan in October 2012 showing no change; followed by Dr. Servando Snare   ASCVD (arteriosclerotic cardiovascular disease)    Prior BMS to the 2nd OM in September 2012; with repeat cath in October showing patency   CAD (coronary artery disease)    a. s/p BMS to 2nd OM in Sept 2012; b. LexiScan Myoview (12/2012):  Inf infarct; bowel and motion artifact make study difficult to interpret; no ischemia; not gated; Low Risk    CHF (congestive heart failure) (HCC)    no recent issues 10/13/14   Chronic back pain    "all over my back" (05/11/2017)   Colonic polyp    Contact lens/glasses fitting    Diastolic dysfunction    DVT (deep venous thrombosis) ([place])    ?LLE   Frequent headaches    "probably weekly" (05/11/2017)   Generalized headaches    neck stenosis   GERD (gastroesophageal reflux disease)    Hearing loss    Hearing loss    more so on left   Hemorrhoids    History of stomach ulcers    Hypertension    IBS (irritable bowel syndrome)    LVH (left ventricular hypertrophy)    Mild intermittent asthma    OA (osteoarthritis)    "all over" (05/11/2017)   Obesities, morbid (HCC)    OSA (obstructive sleep apnea)    PSG 03/30/97 AHI 21, BPAP 13/9   OSA on CPAP    PAF (paroxysmal atrial fibrillation) ([place])    a. on Xarelto b. s/p DCCV in 08/2016; b. Tikosyn failed 04/16/17 with plans for Multaq and possible Afib ablation with Dr. Rayann Heman   Pneumonia    'several times" (05/11/2017)   Prostate CA [facility])    Oncologist  DR. Daralene Milch [facility] dx 09/24/14, undetermined tx   prostate; S/P "radiation and hormone injections"   Pulmonary embolism ([place]) 2008   "both lungs"   SOB (shortness of breath)    on excertion   Thoracic aortic aneurysm (HCC)    Aortic Size Index=     5.0    /Body surface area is 2.43 meters squared. = 2.05  < 2.75 cm/m2      4% risk per year 2.75 to 4.25          8% risk per year > 4.25 cm/m2    20% risk per year   Stable aneurysmal dilation of the ascending aorta with maximum AP diameter of 4.8 cm. Stable area of narrowing of the proximal most portion of the descending aorta measuring 2 cm., previously identified as an area of coarctation. No evidence of aortic dissection.  Coronary artery disease.  Normal appearance of the lungs.   Electronically Signed   By: Fidela Salisbury M.D.   On: 10/01/2014 08:50     Type II diabetes mellitus ([place])    metphormin, average 154 dx 2017    Past Surgical  History:  Procedure Laterality Date   ACHILLES TENDON REPAIR Bilateral    AORTIC ARCH ANGIOGRAPHY N/A 03/13/2017   Procedure: AORTIC ARCH ANGIOGRAPHY;  Surgeon: Martinique, Neylan Koroma M, MD;  Location: [place] [facility];  Service: Cardiovascular;  Laterality: N/A;   APPENDECTOMY     ATRIAL FIBRILLATION ABLATION  05/11/2017   ATRIAL FIBRILLATION ABLATION N/A 05/11/2017   Procedure: ATRIAL FIBRILLATION ABLATION;  Surgeon: [name], MD;  Location: [place] [facility];  Service: Cardiovascular;  Laterality: N/A;   BACK SURGERY     "  I've had 7 back and 1 neck ORs" (05/11/2017)   BIOPSY  03/14/2018   Procedure: BIOPSY;  Surgeon: Ronnette Juniper, MD;  Location: [facility];  Service: Gastroenterology;;  EGD and Colon   CARDIAC CATHETERIZATION  2006   CARPAL TUNNEL RELEASE Bilateral    LEFT   CATARACT EXTRACTION W/ INTRAOCULAR LENS  IMPLANT, BILATERAL Bilateral    CERVICAL SPINE SURGERY  06/02/2010   lower back and neck   COLONOSCOPY N/A 03/14/2018   Procedure: COLONOSCOPY;  Surgeon: Ronnette Juniper, MD;  Location: [facility];  Service: Gastroenterology;  Laterality: N/A;   COLONOSCOPY WITH PROPOFOL N/A 12/29/2014   Procedure: COLONOSCOPY WITH PROPOFOL;  Surgeon: Garlan Fair, MD;  Location: [facility];  Service: Endoscopy;  Laterality: N/A;   CORONARY ANGIOPLASTY WITH STENT PLACEMENT  October 2012   CORONARY STENT PLACEMENT  Sept 2012   2nd OM with BMS   ESOPHAGOGASTRODUODENOSCOPY N/A 03/14/2018   Procedure: ESOPHAGOGASTRODUODENOSCOPY (EGD);  Surgeon: Ronnette Juniper, MD;  Location: [facility];  Service: Gastroenterology;  Laterality: N/A;   HEMORROIDECTOMY     LAMINECTOMY  05/30/2012   L 4 L5   LAMINECTOMY WITH POSTERIOR LATERAL ARTHRODESIS LEVEL 3 N/A 10/18/2016   Procedure: Posterior Lateral Fusion - Lumbar One-Four, segmental instrumentation Lumbar One-Five,  decompression,;  Surgeon: Eustace Moore, MD;  Location: [place];  Service: Neurosurgery;  Laterality: N/A;   LAPAROSCOPIC CHOLECYSTECTOMY      LAPAROSCOPIC GASTRIC BANDING     LEFT AND RIGHT HEART CATHETERIZATION WITH CORONARY ANGIOGRAM N/A 05/07/2014   Procedure: LEFT AND RIGHT HEART CATHETERIZATION WITH CORONARY ANGIOGRAM;  Surgeon: Romaine Maciolek M Martinique, MD;  Location: [facility];  Service: Cardiovascular;  Laterality: N/A;   LEFT HEART CATH AND CORONARY ANGIOGRAPHY N/A 03/13/2017   Procedure: LEFT HEART CATH AND CORONARY ANGIOGRAPHY;  Surgeon: Martin[place], Javarie Crisp M, MD;  Location: [place] CV [facility];  Service: Cardiovascular;  Laterality: N/A;   LUMBAR LAMINECTOMY/DECOMPRESSION MICRODISCECTOMY N/A 05/04/2016   Procedure: Laminectomy and Foraminotomy - Thoracic twelve-Lumbar one -Posterior Fusion Lumbar one-two;  Surgeon: Eustace Moore, MD;  Location: [place];  Service: Neurosurgery;  Laterality: N/A;   POLYPECTOMY  03/14/2018   Procedure: POLYPECTOMY;  Surgeon: Ronnette Juniper, MD;  [facility];  Service: Gastroenterology;;   POSTERIOR LUMBAR FUSION  10/18/2016   SHOULDER OPEN ROTATOR CUFF REPAIR Bilateral    TONSILLECTOMY AND ADENOIDECTOMY     TOTAL HIP ARTHROPLASTY Right 10/18/2018   Procedure: RIGHT TOTAL HIP ARTHROPLASTY ANTERIOR APPROACH;  Surgeon: Mcarthur Rossetti, MD;  Location: [facility];  Service: Orthopedics;  Laterality: Right;   TRIGGER FINGER RELEASE     LEFT   UVULOPALATOPHARYNGOPLASTY     VASECTOMY      Current Medications: Outpatient Medications Prior to Visit  Medication Sig Dispense Refill   acetaminophen (TYLENOL) 500 MG tablet Take 1,000 mg by mouth every 8 (eight) hours as needed for mild pain or headache.      albuterol (VENTOLIN HFA) 108 (90 Base) MCG/ACT inhaler Inhale 2 puffs into the lungs every 4 (four) hours as needed for wheezing or shortness of breath. 18 g 1   atorvastatin (LIPITOR) 10 MG tablet Take 5 mg by mouth daily at 6 PM.      augmented betamethasone dipropionate (DIPROLENE-AF) 0.05 % cream Apply 1 application topically 2 (two) times daily as needed for itching.  3   benzonatate (TESSALON) 200 MG  capsule Take 1 capsule (200 mg total) by mouth 3 (three) times daily as needed for cough. 30 capsule 1  budesonide-formoterol (SYMBICORT) 160-4.5 MCG/ACT inhaler Inhale 2 puffs into the lungs 2 (two) times daily. As needed     celecoxib (CELEBREX) 200 MG capsule Take 200 mg by mouth 2 (two) times daily.     Cholecalciferol (VITAMIN D-3) 5000 units TABS Take 5,000 Units by mouth daily.     Coenzyme Q10 100 MG capsule Take 100 mg by mouth daily.      dextromethorphan-guaiFENesin (MUCINEX DM) 30-600 MG 12hr tablet Take 2 tablets by mouth as needed.     diphenoxylate-atropine (LOMOTIL) 2.5-0.025 MG tablet Take 1 tablet by mouth 4 (four) times daily as needed.     fluticasone (FLONASE) 50 MCG/ACT nasal spray Place 1 spray into both nostrils as needed for allergies or rhinitis.     furosemide (LASIX) 80 MG tablet Take 1 tablet (80 mg total) by mouth daily.     gabapentin (NEURONTIN) 300 MG capsule Take 300 mg by mouth at bedtime.     JARDIANCE 25 MG TABS tablet Take 1 tablet by mouth daily.     loperamide (IMODIUM) 2 MG capsule Take 1 capsule (2 mg total) by mouth 2 (two) times daily as needed for diarrhea or loose stools. 14 capsule 0   losartan (COZAAR) 25 MG tablet TAKE 1 TABLET BY MOUTH EVERY DAY 90 tablet 3   methocarbamol (ROBAXIN) 500 MG tablet Take 1 tablet (500 mg total) by mouth every 8 (eight) hours as needed for muscle spasms. 60 tablet 1   metoprolol tartrate (LOPRESSOR) 25 MG tablet Take 12.5 mg by mouth 2 (two) times daily.     nitroGLYCERIN (NITROSTAT) 0.4 MG SL tablet Place 1 tablet (0.4 mg total) under the tongue every 5 (five) minutes as needed for chest pain (up to the tablets max. IF you are still have Chest pain after the 3rd tablet call 911.). 25 tablet 5   pantoprazole (PROTONIX) 40 MG tablet TAKE ONE TABLET BY MOUTH BEFORE BREAKFAST (TAKE ON AN EMPTY STOMACH 30 MINUTES PRIOR TO A MEAL)     Polyethyl Glycol-Propyl Glycol (SYSTANE OP) Place 1 drop into both eyes daily as needed  (for dry eyes).      potassium chloride SA (KLOR-CON M20) 20 MEQ tablet Taking two tablet by mouth in the am and 1 tablet in the evening     psyllium (METAMUCIL) 58.6 % packet Take 1 packet by mouth daily.     rivaroxaban (XARELTO) 20 MG TABS tablet Take 1 tablet (20 mg total) by mouth daily with supper. 30 tablet    sildenafil (VIAGRA) 100 MG tablet Take 1 tablet (100 mg total) by mouth as needed for erectile dysfunction. 10 tablet 6   Skin Protectants, Misc. (EUCERIN) cream Apply 1 application topically as needed for dry skin.     spironolactone (ALDACTONE) 25 MG tablet Take 25 mg by mouth daily.     tamsulosin (FLOMAX) 0.4 MG CAPS Take 0.4 mg by mouth every evening.      topiramate (TOPAMAX) 25 MG capsule Take 50 mg by mouth 2 (two) times daily.      traMADol (ULTRAM) 50 MG tablet Take 100 mg by mouth every 6 (six) hours as needed.     No facility-administered medications prior to visit.     Allergies:   Other, Lotensin [benazepril], Tofranil [imipramine], Ace inhibitors, Adhesive [tape], Amoxicillin-pot clavulanate, Codeine, Latex, Metformin, Morphine, Nifedipine, and Quinolones   Social History   Socioeconomic History   Marital status: Married    Spouse name: Not on file   Number of  children: 3   Years of education: Not on file   Highest education level: Not on file  Occupational History   Occupation: Retired from Scientist, clinical (histocompatibility and immunogenetics): RETIRED  Tobacco Use   Smoking status: Former    Packs/day: 1.50    Years: 30.00    Total pack years: 45.00    Types: Cigarettes    Start date: 1958    Quit date: 01/24/1992    Years since quitting: 29.5   Smokeless tobacco: Never  Vaping Use   Vaping Use: Never used  Substance and Sexual Activity   Alcohol use: No   Drug use: No   Sexual activity: Not Currently  Other Topics Concern   Not on file  Social History Narrative   Not on file   Social Determinants of Health   Financial Resource Strain: Not on file  Food Insecurity: Not on  file  Transportation Needs: Not on file  Physical Activity: Not on file  Stress: Not on file  Social Connections: Not on file     Family History:  The patient's family history includes Diabetes in his mother; Emphysema (age of onset: 33) in his father; Heart attack in his sister; Heart disease in his mother; Other in his mother.   Review of Systems:   Please see the history of present illness.    All other systems reviewed and are otherwise negative except as noted above.   Physical Exam:    VS:  There were no vitals taken for this visit.   GENERAL:  Well appearing,  obese WM in NAD HEENT:  PERRL, EOMI, sclera are clear. Oropharynx is clear. NECK:  No jugular venous distention, carotid upstroke brisk and symmetric, no bruits, no thyromegaly or adenopathy LUNGS:  Clear to auscultation bilaterally CHEST:  Unremarkable HEART:  RRR,  PMI not displaced or sustained,S1 and S2 within normal limits, no S3, no S4: no clicks, no rubs, no murmurs ABD:  Soft, nontender. BS +, no masses or bruits. No hepatomegaly, no splenomegaly EXT:  2 + pulses throughout, 1+ edema, no cyanosis no clubbing SKIN:  Warm and dry.  No rashes NEURO:  Alert and oriented x 3. Cranial nerves II through XII intact. PSYCH:  Cognitively intact        Wt Readings from Last 3 Encounters:  07/06/21 252 lb 12.8 oz (114.7 kg)  03/29/21 251 lb (113.9 kg)  02/16/21 250 lb 3.2 oz (113.5 kg)      Studies/Labs Reviewed:   EKG:  EKG is not ordered today.    Recent Labs: 03/10/2021: BUN 22; Creatinine, Ser 1.05; Hemoglobin 12.0; Platelets 112; Potassium 4.5; Sodium 141   Lipid Panel    Component Value Date/Time   CHOL 118 01/07/2013 0410   TRIG 133 01/07/2013 0410   HDL 40 01/07/2013 0410   CHOLHDL 3.0 01/07/2013 0410   VLDL 27 01/07/2013 0410   LDLCALC 51 01/07/2013 0410   Labs dated 07/11/16: cholesterol 114, triglycerides 80, HDL 46, LDL 52. A1c 7.1%.  Dated 01/24/17: cholesterol 110, triglycerides 50, HDL  52, LDL 48. A1c 7.8%.  Dated 07/16/17: cholesterol 106, triglycerides 72, HDL 50, LDL 41. Creatinine 1.36. Hgb 12.8. Other chemistries and TSH normal.  Dated 10/10/18: cholesterol 100, triglycerides 54, HDL 51, LDL 38.  Dated 10/28/19: cholesterol 97, triglycerides 35, HDL 53, LDL 38. Iron level 125. A1c 6.7 %. TSH normal.  Dated 11/26/19: creatinine 1.19. glucose 122. Hgb 12.0. plts 125K. Other chemistries normal.  Dated 11/15/20: cholesterol 137, triglycerides 65,  HDL 78, LDL 46. A1c 7.3%. CBC, CMET, and TSH normal. Dated 07/15/21: A1c 7.1%. cholesterol 108, triglycerides 85, HDL 55, LDL 36. CMET normal.  Additional studies/ records that were reviewed today include:   Myoview 03/06/17: Study Highlights   The left ventricular ejection fraction is normal (55-65%). Nuclear stress EF: 61%. There is a small defect of mild severity present in the basal inferior, mid inferior and apex location. The defect is partially reversible. Overall image quality is poor due to soft tissue attenuation. Cannot rule out a small area of ischemia. There is evidence of transient ischemic dilatation with a TID of 1.27. This is an intermediate risk study. There was no ST segment deviation noted during stress    Echo 03/07/17: Study Conclusions   - Left ventricle: The cavity size was normal. Wall thickness was   increased in a pattern of mild LVH. Systolic function was normal.   The estimated ejection fraction was in the range of 55% to 60%.   Wall motion was normal; there were no regional wall motion   abnormalities. Left ventricular diastolic function parameters   were normal. - Aortic valve: There was very mild stenosis. Valve area (VTI):   1.81 cm^2. Valve area (Vmax): 1.94 cm^2. Valve area (Vmean): 1.85   cm^2. - Right atrium: The atrium was mildly dilated. - Atrial septum: No defect or patent foramen ovale was identified. - Pulmonary arteries: PA peak pressure: 54 mm Hg (S).  Cardiac cath 03/15/17:  Conclusion     Previously placed Ost 2nd Mrg to 2nd Mrg stent (unknown type) is widely patent. There is no aortic valve regurgitation.   1. No significant obstructive CAD 2. Aneurysmal thoracic aorta.    Plan: continue medical management . Consider pulmonary evaluation for symptoms of dyspnea.       Referred for: Dyspnea on exertion   Procedure: This patient underwent staged symptom-limited exercise testing using an individualized bike protocol with expired gas analysis metabolic evaluation during exercise.  Demographics  Age: 88 Ht. (in.) 67.0 Wt. (lb) 272.4 BMI: 42.7      Predicted Peak VO2: 16.0  Gender: Male Ht (cm) 170.2 Wt. (kg) 123.6    Results  Pre-Exercise PFTs   FVC 2.01 (54%)       FEV1 1.21 (43%)         FEV1/FVC 60         MVV 68 (60%)        Exercise Time:    7:00      Watts: 60  RPE: 15  Reason stopped: patient ended test due to dyspnea (9/10)  Additional symptoms: chest tightness (6/10) and lightheaded (5/10)  Resting HR: 55 Peak HR: 107   (49% age predicted max HR)  BP rest: 122/70 BP peak: 170/82  Peak VO2: 11.4 (71% predicted peak VO2)  VE/VCO2 slope:  40  OUES: 1.82  Peak RER: 0.96  Ventilatory Threshold: 8.7 (54% predicted and 76% measured peak VO2)  Peak RR 36  Peak Ventilation:  52.2  VE/MVV:  77%  PETCO2 at peak:  30  O2pulse:  13   (93% predicted O2pulse)   Interpretation  Notes: Patient gave a good effort although he was dyspneic. Pulse-oximetry at rest 97% with a low of 93% at peak exercise. Exercise was performed on a cycle ergometer starting at [facility] and increasing by 10W/min.   ECG:   Resting ECG in sinus bradycardia with 1st degree AV block and incomplete right bundle branch block. HR response blunted. There were  frequent PVCs at rest with suppression during exercise with no sustained arrhythmias and no ST-T changes. BP response appropriate.   PFT:  Pre-exercise spirometry demonstrates severe obstruction. MVV  below normal range.    CPX:  Exercise Capacity- Exercise testing with gas exchange demonstrates a mildly reduced peak VO2 of 11.4 ml/kg/min (71% of the age/gender/weight matched sedentary norms). The RER of 0.96 indicates a submaximal effort. When adjusted to the patient's ideal body weight of 162.6 lb (73.7 kg) the peak VO2 is 16.0 ml/kg (ibw)/min (70% of the ibw-adjusted predicted).   Cardiovascular response- The O2pulse (a surrogate for strokevolume) increased with incremental exercise reaching peak of 13 ml/beat (93% predicted). DeltaVO2/Delta WR is 8.4 Indicating no clear evidence of cardiovascular impairment.  Ventilatory response- The VE/VCO2 slope is elevated and indicates Increased dead space ventilation. The oxygen uptake efficiency slope (OUES) is 1.82. The VO2 at the ventilatory threshold was normal at 54% of the predicted peak VO2 and 76% measured PVO2. At peak exercise, the ventilation reached 54% of the measured MVV and breathing reserve was 16 indicating ventilatory reserve was nearly depleted.  PETCO2 was normal at 54mHg during exercise.   Conclusion: The interpretation of this test is limited due to submaximal effort during the exercise. Based on available data, exercise testing with gas exchange demonstrates mild functional impairment when compared to matched sedentary norms. Suspect dyspnea is multifactorial, there is evidence of effect of deconditioning and primary limitation due to body habitus.   In addition he had inadequate heart rate response to exercise and a state of hyperventilation at peak exercise, which induced an elevated VE/VCO2 slope. He approached depletion of ventilatory reserve which may be due to his obstructive lung disease.  Test, report and preliminary impression by: KLandis Martins MS, ACSM-RCEP 09/26/2017 3:03 PM  FINALIZED PMarshell GarfinkelMD [facility] 10/08/2017, 10:04 AM  Assessment:    No diagnosis found.    Plan:   In  order of problems listed above:  1. Paroxysmal Atrial Fibrillation/ Long-term Anticoagulation - s/p DCCV in August 2018.  - s/p Afib and Aflutter ablation in April 2019. Now off antiarrhythmic therapy. - Maintaining NSR - This patients CHA2DS2-VASc Score and unadjusted Ischemic Stroke Rate (% per year) is equal to 9.7 % stroke rate/year from a score of 6 (HTN, DM, Vascular, Age, TE (2)). He denies any evidence of active bleeding. Continue Xarelto for anticoagulation.   2. CAD - s/p BMS to 2nd OM in 2012, cardiac cath Feb 2019 showed continued patency with nonobstructive disease. - asymptomatic.  - Continue  statin therapy. No ASA secondary to need for Xarelto.  3. Chronic diastolic CHF - echo in [id number] a preserved EF of 55-60%. He has increased edema and weight due to increased sodium intake. -. Note right heart findings in 2016.  - Now on Jardiance  - continue current lasix and aldactone - will update Echo - reinforce need for sodium restriction. - he has had extensive evaluation of his dyspnea over the past 3-4 years. This is multifactorial with diastolic CHF, obesity, deconditioning, OSA.   4. HTN - BP is well-controlled  - continue  Current therapy  5. HLD -  LDL 38.  - continue Atorvastatin '10mg'$  daily.   6. Thoracic aortic aneurysm  - stable at 54.9 cm by last CT Scan. - followed by Dr. BCyndia Bent with CT Surgery.   7. Chronic respiratory failure due to morbid obesity and OSA. Followed by pulmonary. - recent CPX suggests major limitation is  deconditioning and body habitus. Improved with weight loss.   8. Pulmonary HTN secondary to #3 and #7.  Cardiac cath in 2016 showed mild pulmonary venous HTN. Last Echo in Sept 2019 showed normal pulmonary pressure.     Signed, Ami Thornsberry Martin[place], MD,FACC 07/29/2021 2:48 PM    [facility] [address] [place], [place] [address] Phone: [phone]

## 2021-08-01 ENCOUNTER — Encounter: Payer: Self-pay | Admitting: Cardiology

## 2021-08-01 ENCOUNTER — Ambulatory Visit (INDEPENDENT_AMBULATORY_CARE_PROVIDER_SITE_OTHER): Payer: Medicare Other | Admitting: Cardiology

## 2021-08-01 VITALS — BP 128/75 | HR 74 | Ht 67.0 in | Wt 252.4 lb

## 2021-08-01 DIAGNOSIS — I5032 Chronic diastolic (congestive) heart failure: Secondary | ICD-10-CM | POA: Diagnosis not present

## 2021-08-01 DIAGNOSIS — I10 Essential (primary) hypertension: Secondary | ICD-10-CM

## 2021-08-01 DIAGNOSIS — I480 Paroxysmal atrial fibrillation: Secondary | ICD-10-CM | POA: Diagnosis not present

## 2021-08-01 DIAGNOSIS — I2510 Atherosclerotic heart disease of native coronary artery without angina pectoris: Secondary | ICD-10-CM

## 2021-08-01 DIAGNOSIS — I272 Pulmonary hypertension, unspecified: Secondary | ICD-10-CM

## 2021-08-01 NOTE — Patient Instructions (Signed)
Medication Instructions:  Hold Atorvastatin for 3 to 4 weeks if symptoms go away Start back Atorvastatin 5 mg daily  Continue all other medications *If you need a refill on your cardiac medications before your next appointment, please call your pharmacy*   Lab Work: None ordered   Testing/Procedures: None ordered   Follow-Up: At [facility], you and your health needs are our priority.  As part of our continuing mission to provide you with exceptional heart care, we have created designated Provider Care Teams.  These Care Teams include your primary Cardiologist (physician) and Advanced Practice Providers (APPs -  Physician Assistants and Nurse Practitioners) who all work together to provide you with the care you need, when you need it.  We recommend signing up for the patient portal called "MyChart".  Sign up information is provided on this After Visit Summary.  MyChart is used to connect with patients for Virtual Visits (Telemedicine).  Patients are able to view lab/test results, encounter notes, upcoming appointments, etc.  Non-urgent messages can be sent to your provider as well.   To learn more about what you can do with MyChart, go to [email or web address].    Your next appointment:  6 months    The format for your next appointment: Office     Provider:  [place]

## 2021-08-16 DIAGNOSIS — M47817 Spondylosis without myelopathy or radiculopathy, lumbosacral region: Secondary | ICD-10-CM | POA: Diagnosis not present

## 2021-08-18 ENCOUNTER — Encounter: Payer: Self-pay | Admitting: Cardiology

## 2021-08-18 ENCOUNTER — Ambulatory Visit (INDEPENDENT_AMBULATORY_CARE_PROVIDER_SITE_OTHER): Payer: Medicare Other | Admitting: Cardiology

## 2021-08-18 VITALS — BP 92/62 | HR 48 | Ht 67.0 in | Wt 251.0 lb

## 2021-08-18 DIAGNOSIS — I10 Essential (primary) hypertension: Secondary | ICD-10-CM | POA: Diagnosis not present

## 2021-08-18 DIAGNOSIS — I5032 Chronic diastolic (congestive) heart failure: Secondary | ICD-10-CM | POA: Diagnosis not present

## 2021-08-18 DIAGNOSIS — I480 Paroxysmal atrial fibrillation: Secondary | ICD-10-CM | POA: Diagnosis not present

## 2021-08-18 NOTE — Progress Notes (Signed)
Electrophysiology Office Note:    Date:  08/18/2021   ID:  Carlos Dawson, DOB February 01, 1940, MRN [id number]  PCP:  Josetta Huddle, MD  [facility] Cardiologist:  Peter Martinique, MD  [facility] Electrophysiologist:  [name], MD   Referring MD: Josetta Huddle, MD   Chief Complaint: New patient consult for Watchman  History of Present Illness:    Carlos Dawson is a 81 y.o. male who presents for an evaluation for Watchman at the request of Roderic Palau, NP. Their medical history includes persistent afib/flutter with afib ablation 04/2017, aortic root enlargement, ascending aortic aneurysm, ASCVD, CAD s/p BMS to 2nd OM 09/2010, congestive heart failure, DVT, PE, GERD, gastric ulcers, hypertension, LVH, morbid obesity, and OSA on CPAP.  He saw Roderic Palau, NP on 07/06/2021 where he was doing well. His EKG showed sinus bradycardia at 48 bpm. It was noted that he was recently started on Celebrex 200 mg BID for back pain due to arthritis. No pain relief with tylenol or tramadol. After discussion he was interested in pursuing the Watchman device, so he was referred to EP for further discussion. He agreed to stop his Celebrex in the interim.  Today, he confirms that his pain had improved while on Celebrex.  They present EKG tracings on his phone, personally reviewed. In clinic he performed another EKG tracing via his smart watch which detected Afib, but on personal review was Sinus rhythm.  He is compliant with Eliquis. He endorses easy bruising; there are multiple ecchymoses of his bilateral UE.  They deny any palpitations, chest pain, shortness of breath, or peripheral edema. No lightheadedness, headaches, syncope, orthopnea, or PND.     Past Medical History:  Diagnosis Date   Anticoagulant long-term use    Aortic root enlargement (HCC)    Ascending aortic aneurysm ([place])    recent scan in October 2012 showing no change; followed by Dr. Servando Snare   ASCVD (arteriosclerotic  cardiovascular disease)    Prior BMS to the 2nd OM in September 2012; with repeat cath in October showing patency   CAD (coronary artery disease)    a. s/p BMS to 2nd OM in Sept 2012; b. LexiScan Myoview (12/2012):  Inf infarct; bowel and motion artifact make study difficult to interpret; no ischemia; not gated; Low Risk   CHF (congestive heart failure) (HCC)    no recent issues 10/13/14   Chronic back pain    "all over my back" (05/11/2017)   Colonic polyp    Contact lens/glasses fitting    Diastolic dysfunction    DVT (deep venous thrombosis) ([place])    ?LLE   Frequent headaches    "probably weekly" (05/11/2017)   Generalized headaches    neck stenosis   GERD (gastroesophageal reflux disease)    Hearing loss    Hearing loss    more so on left   Hemorrhoids    History of stomach ulcers    Hypertension    IBS (irritable bowel syndrome)    LVH (left ventricular hypertrophy)    Mild intermittent asthma    OA (osteoarthritis)    "all over" (05/11/2017)   Obesities, morbid (HCC)    OSA (obstructive sleep apnea)    PSG 03/30/97 AHI 21, BPAP 13/9   OSA on CPAP    PAF (paroxysmal atrial fibrillation) ([place])    a. on Xarelto b. s/p DCCV in 08/2016; b. Tikosyn failed 04/16/17 with plans for Multaq and possible Afib ablation with Dr. Rayann Heman   Pneumonia    '  several times" (05/11/2017)   Prostate CA [facility])    Oncologist  DR. Daralene Mil[facility] dx 09/24/14, undetermined tx   prostate; S/P "radiation and hormone injections"   Pulmonary embolism ([place]) 2008   "both lungs"   SOB (shortness of breath)    on excertion   Thoracic aortic aneurysm (HCC)    Aortic Size Index=     5.0    /Body surface area is 2.43 meters squared. = 2.05  < 2.75 cm/m2      4% risk per year 2.75 to 4.25          8% risk per year > 4.25 cm/m2    20% risk per year   Stable aneurysmal dilation of the ascending aorta with maximum AP diameter of 4.8 cm. Stable area of narrowing of the proximal most portion of the descending aorta  measuring 2 cm., previously identified as an area of coarctation. No evidence of aortic dissection.  Coronary artery disease.  Normal appearance of the lungs.   Electronically Signed   By: Fidela Salisbury M.D.   On: 10/01/2014 08:50     Type II diabetes mellitus ([place])    metphormin, average 154 dx 2017    Past Surgical History:  Procedure Laterality Date   ACHILLES TENDON REPAIR Bilateral    AORTIC ARCH ANGIOGRAPHY N/A 03/13/2017   Procedure: AORTIC ARCH ANGIOGRAPHY;  Surgeon: Martinique, [name], MD;  Location: [place] [facility];  Service: Cardiovascular;  Laterality: N/A;   APPENDECTOMY     ATRIAL FIBRILLATION ABLATION  05/11/2017   ATRIAL FIBRILLATION ABLATION N/A 05/11/2017   Procedure: ATRIAL FIBRILLATION ABLATION;  Surgeon: [name], MD;  Location: [place] [facility];  Service: Cardiovascular;  Laterality: N/A;   BACK SURGERY     "I've had 7 back and 1 neck ORs" (05/11/2017)   BIOPSY  03/14/2018   Procedure: BIOPSY;  Surgeon: Ronnette Juniper, MD;  Location: [facility];  Service: Gastroenterology;;  EGD and Colon   CARDIAC CATHETERIZATION  2006   CARPAL TUNNEL RELEASE Bilateral    LEFT   CATARACT EXTRACTION W/ INTRAOCULAR LENS  IMPLANT, BILATERAL Bilateral    CERVICAL SPINE SURGERY  06/02/2010   lower back and neck   COLONOSCOPY N/A 03/14/2018   Procedure: COLONOSCOPY;  Surgeon: Ronnette Juniper, MD;  Location: [facility];  Service: Gastroenterology;  Laterality: N/A;   COLONOSCOPY WITH PROPOFOL N/A 12/29/2014   Procedure: COLONOSCOPY WITH PROPOFOL;  Surgeon: Garlan Fair, MD;  Location: [facility];  Service: Endoscopy;  Laterality: N/A;   CORONARY ANGIOPLASTY WITH STENT PLACEMENT  October 2012   CORONARY STENT PLACEMENT  Sept 2012   2nd OM with BMS   ESOPHAGOGASTRODUODENOSCOPY N/A 03/14/2018   Procedure: ESOPHAGOGASTRODUODENOSCOPY (EGD);  Surgeon: Ronnette Juniper, MD;  Location: [facility];  Service: Gastroenterology;  Laterality: N/A;   HEMORROIDECTOMY     LAMINECTOMY  05/30/2012    L 4 L5   LAMINECTOMY WITH POSTERIOR LATERAL ARTHRODESIS LEVEL 3 N/A 10/18/2016   Procedure: Posterior Lateral Fusion - Lumbar One-Four, segmental instrumentation Lumbar One-Five,  decompression,;  Surgeon: Eustace Moore, MD;  Location: [facility];  Service: Neurosurgery;  Laterality: N/A;   LAPAROSCOPIC CHOLECYSTECTOMY     LAPAROSCOPIC GASTRIC BANDING     LEFT AND RIGHT HEART CATHETERIZATION WITH CORONARY ANGIOGRAM N/A 05/07/2014   Procedure: LEFT AND RIGHT HEART CATHETERIZATION WITH CORONARY ANGIOGRAM;  Surgeon: [name] Martinique, MD;  Location: [facility] C[facility];  Service: Cardiovascular;  Laterality: N/A;   LEFT HEART CATH AND CORONARY ANGIOGRAPHY N/A  03/13/2017   Procedure: LEFT HEART CATH AND CORONARY ANGIOGRAPHY;  Surgeon: Martinique, [name], MD;  Location: [place];  Service: Cardiovascular;  Laterality: N/A;   LUMBAR LAMINECTOMY/DECOMPRESSION MICRODISCECTOMY N/A 05/04/2016   Procedure: Laminectomy and Foraminotomy - Thoracic twelve-Lumbar one -Posterior Fusion Lumbar one-two;  Surgeon: Eustace Moore, MD;  Location: [facility];  Service: Neurosurgery;  Laterality: N/A;   POLYPECTOMY  03/14/2018   Procedure: POLYPECTOMY;  Surgeon: Ronnette Juniper, MD;  [facility]: [facility];  Service: Gastroenterology;;   POSTERIOR LUMBAR FUSION  10/18/2016   SHOULDER OPEN ROTATOR CUFF REPAIR Bilateral    TONSILLECTOMY AND ADENOIDECTOMY     TOTAL HIP ARTHROPLASTY Right 10/18/2018   Procedure: RIGHT TOTAL HIP ARTHROPLASTY ANTERIOR APPROACH;  Surgeon: Mcarthur Rossetti, MD;  [facility]: [facility];  Service: Orthopedics;  Laterality: Right;   TRIGGER FINGER RELEASE     LEFT   UVULOPALATOPHARYNGOPLASTY     VASECTOMY      Current Medications: Current Meds  Medication Sig   acetaminophen (TYLENOL) 500 MG tablet Take 1,000 mg by mouth every 8 (eight) hours as needed for mild pain or headache.    albuterol (VENTOLIN HFA) 108 (90 Base) MCG/ACT inhaler Inhale 2 puffs into the lungs every 4 (four) hours as needed for wheezing  or shortness of breath.   augmented betamethasone dipropionate (DIPROLENE-AF) 0.05 % cream Apply 1 application topically 2 (two) times daily as needed for itching.   benzonatate (TESSALON) 200 MG capsule Take 1 capsule (200 mg total) by mouth 3 (three) times daily as needed for cough.   budesonide-formoterol (SYMBICORT) 160-4.5 MCG/ACT inhaler Inhale 2 puffs into the lungs 2 (two) times daily. As needed   Cholecalciferol (VITAMIN D-3) 5000 units TABS Take 5,000 Units by mouth daily.   Coenzyme Q10 100 MG capsule Take 100 mg by mouth daily.    dextromethorphan-guaiFENesin (MUCINEX DM) 30-600 MG 12hr tablet Take 2 tablets by mouth as needed.   diphenoxylate-atropine (LOMOTIL) 2.5-0.025 MG tablet Take 1 tablet by mouth 4 (four) times daily as needed.   fluticasone (FLONASE) 50 MCG/ACT nasal spray Place 1 spray into both nostrils as needed for allergies or rhinitis.   furosemide (LASIX) 80 MG tablet Take 1 tablet (80 mg total) by mouth daily.   gabapentin (NEURONTIN) 300 MG capsule Take 300 mg by mouth at bedtime.   JARDIANCE 25 MG TABS tablet Take 1 tablet by mouth daily.   loperamide (IMODIUM) 2 MG capsule Take 1 capsule (2 mg total) by mouth 2 (two) times daily as needed for diarrhea or loose stools.   losartan (COZAAR) 25 MG tablet TAKE 1 TABLET BY MOUTH EVERY DAY   methocarbamol (ROBAXIN) 500 MG tablet Take 1 tablet (500 mg total) by mouth every 8 (eight) hours as needed for muscle spasms.   metoprolol tartrate (LOPRESSOR) 25 MG tablet Take 12.5 mg by mouth 2 (two) times daily.   nitroGLYCERIN (NITROSTAT) 0.4 MG SL tablet Place 1 tablet (0.4 mg total) under the tongue every 5 (five) minutes as needed for chest pain (up to the tablets max. IF you are still have Chest pain after the 3rd tablet call 911.).   pantoprazole (PROTONIX) 40 MG tablet TAKE ONE TABLET BY MOUTH BEFORE BREAKFAST (TAKE ON AN EMPTY STOMACH 30 MINUTES PRIOR TO A MEAL)   Polyethyl Glycol-Propyl Glycol (SYSTANE OP) Place 1 drop  into both eyes daily as needed (for dry eyes).    potassium chloride SA (KLOR-CON M20) 20 MEQ tablet Taking two tablet by mouth in the  am and 1 tablet in the evening   psyllium (METAMUCIL) 58.6 % packet Take 1 packet by mouth daily.   rivaroxaban (XARELTO) 20 MG TABS tablet Take 1 tablet (20 mg total) by mouth daily with supper.   sildenafil (VIAGRA) 100 MG tablet Take 1 tablet (100 mg total) by mouth as needed for erectile dysfunction.   Skin Protectants, Misc. (EUCERIN) cream Apply 1 application topically as needed for dry skin.   spironolactone (ALDACTONE) 25 MG tablet Take 25 mg by mouth daily.   tamsulosin (FLOMAX) 0.4 MG CAPS Take 0.4 mg by mouth every evening.    topiramate (TOPAMAX) 25 MG capsule Take 50 mg by mouth 2 (two) times daily.    traMADol (ULTRAM) 50 MG tablet Take 100 mg by mouth every 6 (six) hours as needed.     Allergies:   Other, Lotensin [benazepril], Tofranil [imipramine], Ace inhibitors, Adhesive [tape], Amoxicillin-pot clavulanate, Codeine, Latex, Metformin, Morphine, Nifedipine, and Quinolones   Social History   Socioeconomic History   Marital status: Married    Spouse name: Not on file   Number of children: 3   Years of education: Not on file   Highest education level: Not on file  Occupational History   Occupation: Retired from Scientist, clinical (histocompatibility and immunogenetics): RETIRED  Tobacco Use   Smoking status: Former    Packs/day: 1.50    Years: 30.00    Total pack years: 45.00    Types: Cigarettes    Start date: 1958    Quit date: 01/24/1992    Years since quitting: 29.5   Smokeless tobacco: Never  Vaping Use   Vaping Use: Never used  Substance and Sexual Activity   Alcohol use: No   Drug use: No   Sexual activity: Not Currently  Other Topics Concern   Not on file  Social History Narrative   Not on file   Social Determinants of Health   Financial Resource Strain: Not on file  Food Insecurity: Not on file  Transportation Needs: Not on file  Physical Activity: Not  on file  Stress: Not on file  Social Connections: Not on file     Family History: The patient's family history includes Diabetes in his mother; Emphysema (age of onset: 54) in his father; Heart attack in his sister; Heart disease in his mother; Other in his mother.  ROS:   Please see the history of present illness.    (+) Back pain (+) Ecchymosis All other systems reviewed and are negative.  EKGs/Labs/Other Studies Reviewed:    The following studies were reviewed today:  03/07/2021  Echocardiogram:  1. Left ventricular ejection fraction, by estimation, is 50 to 55%. The  left ventricle has low normal function. The left ventricle has no regional  wall motion abnormalities. The left ventricular internal cavity size was  moderately dilated. Left  ventricular diastolic parameters are consistent with Grade I diastolic  dysfunction (impaired relaxation).   2. Right ventricular systolic function is normal. The right ventricular  size is normal. There is mildly elevated pulmonary artery systolic  pressure.   3. Left atrial size was mildly dilated.   4. The mitral valve is normal in structure. No evidence of mitral valve  regurgitation. No evidence of mitral stenosis.   5. The aortic valve is normal in structure. Aortic valve regurgitation is  not visualized. No aortic stenosis is present.   6. Aortic dilatation noted. Aneurysm of the ascending aorta, measuring 50  mm. There is moderate dilatation of the  aortic root, measuring 45 mm.   7. The inferior vena cava is dilated in size with >50% respiratory  variability, suggesting right atrial pressure of 8 mmHg.   05/11/2017  Atrial Fibrillation Ablation: CONCLUSIONS: 1. Sinus rhythm upon presentation.   2. Intracardiac echo reveals a moderate sized left atrium with four separate pulmonary veins without evidence of pulmonary vein stenosis. 3. Successful electrical isolation and anatomical encircling of all four pulmonary veins with  radiofrequency current.    4. Cavo-tricuspid isthmus ablation was performed with complete bidirectional isthmus block achieved.  5. No inducible arrhythmias following ablation both on and off of Isuprel 6. No early apparent complications.     EKG:   EKG is personally reviewed.    Apple Watch tracings personally reviewed during today's appointment shows sinus rhythm with frequent PACs   Recent Labs: 03/10/2021: BUN 22; Creatinine, Ser 1.05; Hemoglobin 12.0; Platelets 112; Potassium 4.5; Sodium 141   Recent Lipid Panel    Component Value Date/Time   CHOL 118 01/07/2013 0410   TRIG 133 01/07/2013 0410   HDL 40 01/07/2013 0410   CHOLHDL 3.0 01/07/2013 0410   VLDL 27 01/07/2013 0410   LDLCALC 51 01/07/2013 0410    Physical Exam:    VS:  BP 92/62   Pulse (!) 48   Ht '5\' 7"'$  (1.702 m)   Wt 251 lb (113.9 kg)   BMI 39.31 kg/m     Wt Readings from Last 3 Encounters:  08/18/21 251 lb (113.9 kg)  08/01/21 252 lb 6.4 oz (114.5 kg)  07/06/21 252 lb 12.8 oz (114.7 kg)     GEN: Well nourished, well developed in no acute distress.  Obese.   HEENT: Normal NECK: No JVD; No carotid bruits LYMPHATICS: No lymphadenopathy CARDIAC: Irregular rhythm, no murmurs, rubs, gallops RESPIRATORY:  Clear to auscultation without rales, wheezing or rhonchi  ABDOMEN: Soft, non-tender, non-distended MUSCULOSKELETAL:  No edema; No deformity  SKIN: Warm and dry NEUROLOGIC:  Alert and oriented x 3 PSYCHIATRIC:  Normal affect       ASSESSMENT:    1. Paroxysmal atrial fibrillation (HCC)   2. Chronic diastolic heart failure ([place])   3. Primary hypertension    PLAN:    In order of problems listed above:  #Paroxysmal atrial fibrillation Maintaining rhythm after his ablation with Dr. Rayann Heman May 11, 2017.  He currently takes Xarelto for stroke prophylaxis.  He presents to discuss left atrial appendage occlusion as a mechanism to avoid long-term exposure to anticoagulation given his use of NSAIDs.   I had a long discussion with the patient and his family during today's visit.  He will think about his options and let us know how he wants to proceed.  ------------  I have seen [name] in the office today who is being considered for a Watchman left atrial appendage closure device. I believe they will benefit from this procedure given their history of atrial fibrillation, CHA2DS2-VASc score of 4 and unadjusted ischemic stroke rate of 4.8% per year. Unfortunately, the patient is not felt to be a long term anticoagulation candidate secondary to a desire for chronic use of NSAIDs. The patient's chart has been reviewed and I feel that they would be a candidate for short term oral anticoagulation after Watchman implant.   It is my belief that after undergoing a LAA closure procedure, Carlos Dawson will not need long term anticoagulation which eliminates anticoagulation side effects and major bleeding risk.   Procedural risks for the Watchman implant  have been reviewed with the patient including a 0.5% risk of stroke, <1% risk of perforation and <1% risk of device embolization. Other risks include bleeding, vascular damage, tamponade, worsening renal function, and death. The patient understands these risks.  He will let us know how he would like to proceed.   The published clinical data on the safety and effectiveness of WATCHMAN include but are not limited to the following: - Holmes DR, Mechele Claude, Sick P et al. for the PROTECT AF Investigators. Percutaneous closure of the left atrial appendage versus warfarin therapy for prevention of stroke in patients with atrial fibrillation: a randomised non-inferiority trial. Lancet 2009; 374: 534-42. Mechele Claude, Doshi SK, Abelardo Diesel D et al. on behalf of the PROTECT AF Investigators. Percutaneous Left Atrial Appendage Closure for Stroke Prophylaxis in Patients With Atrial Fibrillation 2.3-Year Follow-up of the PROTECT AF (Watchman Left Atrial  Appendage System for Embolic Protection in Patients With Atrial Fibrillation) Trial. Circulation 2013; 127:720-729. - Alli O, Doshi S,  Kar S, Reddy VY, Sievert H et al. Quality of Life Assessment in the Randomized PROTECT AF (Percutaneous Closure of the Left Atrial Appendage Versus Warfarin Therapy for Prevention of Stroke in Patients With Atrial Fibrillation) Trial of Patients at Risk for Stroke With Nonvalvular Atrial Fibrillation. J Am Coll Cardiol 2013; 12:4580-9. Vertell Limber DR, Tarri Abernethy, Price M, [place], Sievert H, Doshi S, Huber K, Reddy V. Prospective randomized evaluation of the Watchman left atrial appendage Device in patients with atrial fibrillation versus long-term warfarin therapy; the PREVAIL trial. Journal of the [employer] of Cardiology, Vol. 4, No. 1, 2014, 1-11. - Kar S, Doshi SK, Sadhu A, Horton R, Osorio J et al. Primary outcome evaluation of a next-generation left atrial appendage closure device: results from the PINNACLE FLX trial. Circulation 2021;143(18)1754-1762.    After today's visit with the patient which was dedicated solely for shared decision making visit regarding LAA closure device, the patient decided to think about his options before finalizing a plan about left atrial appendage occlusion.  If he elects to proceed, I would like to obtain a gated CT scan of the chest with contrast timed for PV/LA visualization.   HAS-BLED score 3 Hypertension Yes  Abnormal renal and liver function (Dialysis, transplant, Cr >2.26 mg/dL /Cirrhosis or Bilirubin >2x Normal or AST/ALT/AP >3x Normal) No  Stroke No  Bleeding No  Labile INR (Unstable/high INR) No  Elderly (>65) Yes  Drugs or alcohol (? 8 drinks/week, anti-plt or NSAID) Yes   CHA2DS2-VASc Score = 4  The patient's score is based upon: CHF History: 1 HTN History: 1 Diabetes History: 0 Stroke History: 0 Vascular Disease History: 0 Age Score: 2 Gender Score: 0  #Hypertension Below goal today.  Continue current  medication regimen.  #Chronic diastolic heart failure Rhythm control indicated.  Appears euvolemic on exam.  Continue current medication regimen.   Total time spent with patient today 60 minutes. This includes reviewing records, evaluating the patient and coordinating care.  Medication Adjustments/Labs and Tests Ordered: Current medicines are reviewed at length with the patient today.  Concerns regarding medicines are outlined above.  No orders of the defined types were placed in this encounter.  No orders of the defined types were placed in this encounter.   I,Mathew Stumpf,acting as a Education administrator for Vickie Epley, MD.,have documented all relevant documentation on the behalf of Vickie Epley, MD,as directed by  Vickie Epley, MD while in the presence of Vickie Epley, MD.  I, Vickie Epley, MD, have reviewed all documentation for this visit. The documentation on 08/18/21 for the exam, diagnosis, procedures, and orders are all accurate and complete.   Signed, Hilton Cork. Quentin Ore, MD, [facility], [facility] 08/18/2021 5:34 PM    Electrophysiology [facility]

## 2021-08-18 NOTE — Patient Instructions (Addendum)
Medication Instructions:  Your physician recommends that you continue on your current medications as directed. Please refer to the Current Medication list given to you today. *If you need a refill on your cardiac medications before your next appointment, please call your pharmacy*  Lab Work: None. If you have labs (blood work) drawn today and your tests are completely normal, you will receive your results only by: [place] (if you have MyChart) OR A paper copy in the mail If you have any lab test that is abnormal or we need to change your treatment, we will call you to review the results.  Testing/Procedures: Your physician has requested that you have Left atrial appendage (LAA) closure device implantation is a procedure to put a small device in the LAA of the heart. The LAA is a small sac in the wall of the heart's left upper chamber. Blood clots can form in this area. The device, Watchman closes the LAA to help prevent a blood clot and stroke.   Follow-Up: At [facility], you and your health needs are our priority.  As part of our continuing mission to provide you with exceptional heart care, we have created designated Provider Care Teams.  These Care Teams include your primary Cardiologist (physician) and Advanced Practice Providers (APPs -  Physician Assistants and Nurse Practitioners) who all work together to provide you with the care you need, when you need it.  Your physician wants you to follow-up in: please call Otila Kluver RN if you wish to move forward with Watchman.   We recommend signing up for the patient portal called "MyChart".  Sign up information is provided on this After Visit Summary.  MyChart is used to connect with patients for Virtual Visits (Telemedicine).  Patients are able to view lab/test results, encounter notes, upcoming appointments, etc.  Non-urgent messages can be sent to your provider as well.   To learn more about what you can do with MyChart, go to  [email or web address].    Any Other Special Instructions Will Be Listed Below (If Applicable).  Left Atrial Appendage Closure Device Implantation  Left atrial appendage (LAA) closure device implantation is a procedure to put a small device in the LAA of the heart. The LAA is a small sac in the wall of the heart's left upper chamber. Blood clots can form in the LAA in people with atrial fibrillation (AFib). The device closes the LAA to help prevent a blood clot and stroke. AFib is a type of irregular or rapid heartbeat (arrhythmia). There is an increased risk of blood clots and stroke with AFib. This procedure helps to reduce that risk. Tell a health care provider about: Any allergies you have. All medicines you are taking, including vitamins, herbs, eye drops, creams, and over-the-counter medicines. Any problems you or family members have had with anesthetic medicines. Any blood disorders you have. Any surgeries you have had. Any medical conditions you have. Whether you are pregnant or may be pregnant. What are the risks? Generally, this is a safe procedure. However, problems may occur, including: Infection. Bleeding. Allergic reactions to medicines or dyes. Damage to nearby structures or organs. Heart attack. Stroke. Blood clots. Changes in heart rhythm. Device failure. What happens before the procedure? Staying hydrated Follow instructions from your health care provider about hydration, which may include: Up to 2 hours before the procedure - you may continue to drink clear liquids, such as water, clear fruit juice, black coffee, and plain tea. Eating and drinking restrictions Follow instructions from  your health care provider about eating and drinking, which may include: 8 hours before the procedure - stop eating heavy meals or foods, such as meat, fried foods, or fatty foods. 6 hours before the procedure - stop eating light meals or foods, such as toast or cereal. 6 hours  before the procedure - stop drinking milk or drinks that contain milk. 2 hours before the procedure - stop drinking clear liquids. Medicines Ask your health care provider about: Changing or stopping your regular medicines. This is especially important if you are taking diabetes medicines or blood thinners. Taking medicines such as aspirin and ibuprofen. These medicines can thin your blood. Do not take these medicines unless your health care provider tells you to take them. Taking over-the-counter medicines, vitamins, herbs, and supplements. Tests You may have blood tests and a physical exam. You may have an electrocardiogram (ECG). This test checks your heart's electrical patterns and rhythms. General instructions Do not use any products that contain nicotine or tobacco. These include cigarettes, chewing tobacco, and vaping devices, such as e-cigarettes. If you need help quitting, ask your health care provider. Ask your health care provider what steps will be taken to help prevent infection. These steps may include: Removing hair at the surgery site. Washing skin with a germ-killing soap. Taking antibiotic medicine. Plan to have a responsible adult take you home from the hospital or clinic. Plan to have a responsible adult care for you for the time you are told after you leave the hospital or clinic. This is important. What happens during the procedure? An IV will be inserted into one of your veins. You will be given one or more of the following: A medicine to help you relax (sedative). A medicine to make you fall asleep (general anesthetic). A small incision will be made in your groin area. A small wire will be put through the incision and into a blood vessel. Dye may be injected so X-rays can be used to guide the wire through the blood vessel. A long, thin tube (catheter) will be put over the small wire and moved up through the blood vessel to reach your heart. The closure device will be  moved through the catheter until it reaches your heart. A small hole will be made in the septum (transseptal puncture). The septum is a thin tissue that separates the upper two chambers of the heart. The device will be placed so that it closes the LAA. X-rays will be done to make sure the device is in the right place. The catheter and wire will be removed. The closure device will remain in your heart. After pressure is applied over the catheter site to prevent bleeding, a bandage (dressing) will be placed over the site where the catheter was inserted. The procedure may vary among health care providers and hospitals. What happens after the procedure? Your blood pressure, heart rate, breathing rate, and blood oxygen level will be monitored until you leave the hospital or clinic. You may have to wear compression stockings. These stockings help to prevent blood clots and reduce swelling in your legs. If you were given a sedative during the procedure, it can affect you for several hours. Do not drive or operate machinery until your health care provider says it is safe. You may be given pain medicine. You may need to drink more fluids to wash (flush) the dye out of your body. Drink enough fluid to keep your urine pale yellow. Take over-the-counter and prescription medicines only as  told by your health care provider. This is especially important if you were given blood thinners. Summary Left atrial appendage (LAA) closure device implantation is a procedure that is done to put a small device in the LAA of the heart. The LAA is a small sac in the wall of the heart's left upper chamber. The device closes the LAA to prevent stroke and other problems. Follow instructions from your health care provider before and after the procedure. This information is not intended to replace advice given to you by your health care provider. Make sure you discuss any questions you have with your health care provider. Document  Revised: 09/18/2019 Document Reviewed: 09/18/2019 Elsevier Patient Education  [place].

## 2021-08-24 ENCOUNTER — Telehealth: Payer: Self-pay

## 2021-08-24 NOTE — Telephone Encounter (Signed)
The patient had a Watchman consult with Dr. Quentin Ore last week and left stating he'd think about the procedure and call back if he wants to proceed.  Called patient and left message including Structural Heart direct phone number to call if he decides to proceed with testing for Watchman.

## 2021-08-30 DIAGNOSIS — M5416 Radiculopathy, lumbar region: Secondary | ICD-10-CM | POA: Diagnosis not present

## 2021-09-08 ENCOUNTER — Other Ambulatory Visit: Payer: Self-pay | Admitting: Nurse Practitioner

## 2021-09-08 ENCOUNTER — Other Ambulatory Visit: Payer: Self-pay

## 2021-09-08 ENCOUNTER — Other Ambulatory Visit: Payer: Self-pay | Admitting: Cardiology

## 2021-09-08 DIAGNOSIS — K5732 Diverticulitis of large intestine without perforation or abscess without bleeding: Secondary | ICD-10-CM | POA: Insufficient documentation

## 2021-09-08 DIAGNOSIS — J45909 Unspecified asthma, uncomplicated: Secondary | ICD-10-CM | POA: Insufficient documentation

## 2021-09-08 MED ORDER — NITROGLYCERIN 0.4 MG SL SUBL: 0.4000 mg | SUBLINGUAL_TABLET | SUBLINGUAL | 5 refills | Status: DC | PRN

## 2021-09-13 DIAGNOSIS — N183 Chronic kidney disease, stage 3 unspecified: Secondary | ICD-10-CM | POA: Diagnosis not present

## 2021-09-13 DIAGNOSIS — N400 Enlarged prostate without lower urinary tract symptoms: Secondary | ICD-10-CM | POA: Diagnosis not present

## 2021-09-13 DIAGNOSIS — J4530 Mild persistent asthma, uncomplicated: Secondary | ICD-10-CM | POA: Diagnosis not present

## 2021-09-13 DIAGNOSIS — D649 Anemia, unspecified: Secondary | ICD-10-CM | POA: Diagnosis not present

## 2021-09-13 DIAGNOSIS — K219 Gastro-esophageal reflux disease without esophagitis: Secondary | ICD-10-CM | POA: Diagnosis not present

## 2021-09-13 DIAGNOSIS — E785 Hyperlipidemia, unspecified: Secondary | ICD-10-CM | POA: Diagnosis not present

## 2021-09-13 DIAGNOSIS — I480 Paroxysmal atrial fibrillation: Secondary | ICD-10-CM | POA: Diagnosis not present

## 2021-09-13 DIAGNOSIS — I10 Essential (primary) hypertension: Secondary | ICD-10-CM | POA: Diagnosis not present

## 2021-09-13 DIAGNOSIS — E0822 Diabetes mellitus due to underlying condition with diabetic chronic kidney disease: Secondary | ICD-10-CM | POA: Diagnosis not present

## 2021-09-15 DIAGNOSIS — Z23 Encounter for immunization: Secondary | ICD-10-CM | POA: Diagnosis not present

## 2021-09-27 DIAGNOSIS — M47817 Spondylosis without myelopathy or radiculopathy, lumbosacral region: Secondary | ICD-10-CM | POA: Diagnosis not present

## 2021-09-27 DIAGNOSIS — M5416 Radiculopathy, lumbar region: Secondary | ICD-10-CM | POA: Diagnosis not present

## 2021-10-13 DIAGNOSIS — M79601 Pain in right arm: Secondary | ICD-10-CM | POA: Diagnosis not present

## 2021-10-13 DIAGNOSIS — R0982 Postnasal drip: Secondary | ICD-10-CM | POA: Diagnosis not present

## 2021-10-18 DIAGNOSIS — M5414 Radiculopathy, thoracic region: Secondary | ICD-10-CM | POA: Diagnosis not present

## 2021-10-19 ENCOUNTER — Other Ambulatory Visit: Payer: Self-pay | Admitting: *Deleted

## 2021-10-19 DIAGNOSIS — I712 Thoracic aortic aneurysm, without rupture, unspecified: Secondary | ICD-10-CM

## 2021-10-26 DIAGNOSIS — M533 Sacrococcygeal disorders, not elsewhere classified: Secondary | ICD-10-CM | POA: Diagnosis not present

## 2021-11-07 DIAGNOSIS — I5030 Unspecified diastolic (congestive) heart failure: Secondary | ICD-10-CM | POA: Diagnosis not present

## 2021-11-07 DIAGNOSIS — E1169 Type 2 diabetes mellitus with other specified complication: Secondary | ICD-10-CM | POA: Diagnosis not present

## 2021-11-07 DIAGNOSIS — D6869 Other thrombophilia: Secondary | ICD-10-CM | POA: Diagnosis not present

## 2021-11-07 DIAGNOSIS — G4733 Obstructive sleep apnea (adult) (pediatric): Secondary | ICD-10-CM | POA: Diagnosis not present

## 2021-11-07 DIAGNOSIS — N183 Chronic kidney disease, stage 3 unspecified: Secondary | ICD-10-CM | POA: Diagnosis not present

## 2021-11-07 DIAGNOSIS — N400 Enlarged prostate without lower urinary tract symptoms: Secondary | ICD-10-CM | POA: Diagnosis not present

## 2021-11-07 DIAGNOSIS — Z8546 Personal history of malignant neoplasm of prostate: Secondary | ICD-10-CM | POA: Diagnosis not present

## 2021-11-07 DIAGNOSIS — I2510 Atherosclerotic heart disease of native coronary artery without angina pectoris: Secondary | ICD-10-CM | POA: Diagnosis not present

## 2021-11-07 DIAGNOSIS — Z23 Encounter for immunization: Secondary | ICD-10-CM | POA: Diagnosis not present

## 2021-11-07 DIAGNOSIS — Z862 Personal history of diseases of the blood and blood-forming organs and certain disorders involving the immune mechanism: Secondary | ICD-10-CM | POA: Diagnosis not present

## 2021-11-07 DIAGNOSIS — D509 Iron deficiency anemia, unspecified: Secondary | ICD-10-CM | POA: Diagnosis not present

## 2021-11-07 DIAGNOSIS — K219 Gastro-esophageal reflux disease without esophagitis: Secondary | ICD-10-CM | POA: Diagnosis not present

## 2021-11-07 DIAGNOSIS — I480 Paroxysmal atrial fibrillation: Secondary | ICD-10-CM | POA: Diagnosis not present

## 2021-11-07 DIAGNOSIS — Z0000 Encounter for general adult medical examination without abnormal findings: Secondary | ICD-10-CM | POA: Diagnosis not present

## 2021-11-07 DIAGNOSIS — I10 Essential (primary) hypertension: Secondary | ICD-10-CM | POA: Diagnosis not present

## 2021-11-07 DIAGNOSIS — Z1331 Encounter for screening for depression: Secondary | ICD-10-CM | POA: Diagnosis not present

## 2021-11-07 DIAGNOSIS — I719 Aortic aneurysm of unspecified site, without rupture: Secondary | ICD-10-CM | POA: Diagnosis not present

## 2021-11-08 DIAGNOSIS — M47817 Spondylosis without myelopathy or radiculopathy, lumbosacral region: Secondary | ICD-10-CM | POA: Diagnosis not present

## 2021-11-08 DIAGNOSIS — M5414 Radiculopathy, thoracic region: Secondary | ICD-10-CM | POA: Diagnosis not present

## 2021-11-18 DIAGNOSIS — M4726 Other spondylosis with radiculopathy, lumbar region: Secondary | ICD-10-CM | POA: Diagnosis not present

## 2021-11-18 DIAGNOSIS — M963 Postlaminectomy kyphosis: Secondary | ICD-10-CM | POA: Diagnosis not present

## 2021-11-18 DIAGNOSIS — M4804 Spinal stenosis, thoracic region: Secondary | ICD-10-CM | POA: Diagnosis not present

## 2021-11-24 DIAGNOSIS — L814 Other melanin hyperpigmentation: Secondary | ICD-10-CM | POA: Diagnosis not present

## 2021-11-24 DIAGNOSIS — L821 Other seborrheic keratosis: Secondary | ICD-10-CM | POA: Diagnosis not present

## 2021-11-24 DIAGNOSIS — B353 Tinea pedis: Secondary | ICD-10-CM | POA: Diagnosis not present

## 2021-11-24 DIAGNOSIS — L239 Allergic contact dermatitis, unspecified cause: Secondary | ICD-10-CM | POA: Diagnosis not present

## 2021-11-24 DIAGNOSIS — L718 Other rosacea: Secondary | ICD-10-CM | POA: Diagnosis not present

## 2021-11-24 DIAGNOSIS — D225 Melanocytic nevi of trunk: Secondary | ICD-10-CM | POA: Diagnosis not present

## 2021-11-25 DIAGNOSIS — M7061 Trochanteric bursitis, right hip: Secondary | ICD-10-CM | POA: Diagnosis not present

## 2021-11-25 DIAGNOSIS — M25551 Pain in right hip: Secondary | ICD-10-CM | POA: Diagnosis not present

## 2021-11-28 ENCOUNTER — Telehealth: Payer: Self-pay

## 2021-11-28 NOTE — Telephone Encounter (Signed)
Patient brought in a surgical clearance form.Dr.Jordan signed surgical clearance form from [facility] Dr.Ruben Torrealba clearing patient for upcoming surgery.Form faxed back to fax # [phone].

## 2021-11-30 ENCOUNTER — Ambulatory Visit (HOSPITAL_COMMUNITY)
Admission: RE | Admit: 2021-11-30 | Discharge: 2021-11-30 | Disposition: A | Discharge status: Home / Self Care | Payer: Medicare Other | Source: Ambulatory Visit | Attending: Surgery | Admitting: Surgery

## 2021-11-30 DIAGNOSIS — I712 Thoracic aortic aneurysm, without rupture, unspecified: Secondary | ICD-10-CM | POA: Diagnosis not present

## 2021-11-30 DIAGNOSIS — E119 Type 2 diabetes mellitus without complications: Secondary | ICD-10-CM | POA: Insufficient documentation

## 2021-11-30 DIAGNOSIS — R911 Solitary pulmonary nodule: Secondary | ICD-10-CM | POA: Diagnosis not present

## 2021-11-30 MED ORDER — IOHEXOL 350 MG/ML SOLN
80.0000 mL | Freq: Once | INTRAVENOUS | Status: AC | PRN
  Administered 2021-11-30: 80 mL via INTRAVENOUS

## 2021-12-06 LAB — POCT I-STAT CREATININE: Creatinine, Ser: 1.3 mg/dL — ABNORMAL HIGH (ref 0.61–1.24)

## 2021-12-08 DIAGNOSIS — C61 Malignant neoplasm of prostate: Secondary | ICD-10-CM | POA: Diagnosis not present

## 2021-12-08 DIAGNOSIS — N3941 Urge incontinence: Secondary | ICD-10-CM | POA: Diagnosis not present

## 2021-12-08 DIAGNOSIS — N529 Male erectile dysfunction, unspecified: Secondary | ICD-10-CM | POA: Diagnosis not present

## 2021-12-21 ENCOUNTER — Ambulatory Visit (INDEPENDENT_AMBULATORY_CARE_PROVIDER_SITE_OTHER): Payer: Medicare Other | Admitting: Surgery

## 2021-12-21 ENCOUNTER — Encounter: Payer: Self-pay | Admitting: Surgery

## 2021-12-21 VITALS — BP 108/61 | HR 59 | Resp 20 | Ht 67.0 in | Wt 250.0 lb

## 2021-12-21 DIAGNOSIS — I2510 Atherosclerotic heart disease of native coronary artery without angina pectoris: Secondary | ICD-10-CM | POA: Diagnosis not present

## 2021-12-21 DIAGNOSIS — I7121 Aneurysm of the ascending aorta, without rupture: Secondary | ICD-10-CM

## 2021-12-21 NOTE — Progress Notes (Signed)
HPI:  The patient is an 81 year old gentleman who returns for follow-up of an aortic root and ascending aortic aneurysm.  He was followed by Dr. Servando Snare since 2013.  The aneurysm has been stable dating back to 2000. He has a known history of coronary disease status post stenting of the left circumflex in the past. He has severe underlying pulmonary disease.  He has limited mobility due to chronic back pain.  He said that he is scheduled for repeat back surgery in January 2024.  He has atrial fibrillation and is anticoagulated on Xarelto. He has a history of prostate cancer treated at [facility].  He has occasional episodes of exertionally related chest tightness and has been followed by Dr. [place]ique.  Current Outpatient Medications  Medication Sig Dispense Refill   acetaminophen (TYLENOL) 500 MG tablet Take 1,000 mg by mouth every 8 (eight) hours as needed for mild pain or headache.      albuterol (VENTOLIN HFA) 108 (90 Base) MCG/ACT inhaler Inhale 2 puffs into the lungs every 4 (four) hours as needed for wheezing or shortness of breath. 18 g 1   augmented betamethasone dipropionate (DIPROLENE-AF) 0.05 % cream Apply 1 application topically 2 (two) times daily as needed for itching.  3   budesonide-formoterol (SYMBICORT) 160-4.5 MCG/ACT inhaler Inhale 2 puffs into the lungs 2 (two) times daily. As needed     Cholecalciferol (VITAMIN D-3) 5000 units TABS Take 5,000 Units by mouth daily.     Coenzyme Q10 100 MG capsule Take 100 mg by mouth daily.      dextromethorphan-guaiFENesin (MUCINEX DM) 30-600 MG 12hr tablet Take 2 tablets by mouth as needed.     diphenoxylate-atropine (LOMOTIL) 2.5-0.025 MG tablet Take 1 tablet by mouth 4 (four) times daily as needed.     fluticasone (FLONASE) 50 MCG/ACT nasal spray Place 1 spray into both nostrils as needed for allergies or rhinitis.     JARDIANCE 25 MG TABS tablet Take 1 tablet by mouth daily.     loperamide (IMODIUM) 2 MG capsule Take 1 capsule (2 mg  total) by mouth 2 (two) times daily as needed for diarrhea or loose stools. 14 capsule 0   losartan (COZAAR) 25 MG tablet TAKE 1 TABLET BY MOUTH EVERY DAY 90 tablet 3   methocarbamol (ROBAXIN) 500 MG tablet Take 1 tablet (500 mg total) by mouth every 8 (eight) hours as needed for muscle spasms. 60 tablet 1   metoprolol tartrate (LOPRESSOR) 25 MG tablet Take 12.5 mg by mouth 2 (two) times daily.     nitroGLYCERIN (NITROSTAT) 0.4 MG SL tablet Place 1 tablet (0.4 mg total) under the tongue every 5 (five) minutes as needed for chest pain. 25 tablet 5   pantoprazole (PROTONIX) 40 MG tablet TAKE ONE TABLET BY MOUTH BEFORE BREAKFAST (TAKE ON AN EMPTY STOMACH 30 MINUTES PRIOR TO A MEAL)     Polyethyl Glycol-Propyl Glycol (SYSTANE OP) Place 1 drop into both eyes daily as needed (for dry eyes).      potassium chloride SA (KLOR-CON M20) 20 MEQ tablet Taking two tablet by mouth in the am and 1 tablet in the evening     pregabalin (LYRICA) 50 MG capsule Take 50 mg by mouth 2 (two) times daily.     psyllium (METAMUCIL) 58.6 % packet Take 1 packet by mouth daily.     rivaroxaban (XARELTO) 20 MG TABS tablet Take 1 tablet (20 mg total) by mouth daily with supper. 30 tablet    sildenafil (VIAGRA)  100 MG tablet Take 1 tablet (100 mg total) by mouth as needed for erectile dysfunction. 10 tablet 6   Skin Protectants, Misc. (EUCERIN) cream Apply 1 application topically as needed for dry skin.     spironolactone (ALDACTONE) 25 MG tablet Take 25 mg by mouth daily.     tamsulosin (FLOMAX) 0.4 MG CAPS Take 0.4 mg by mouth every evening.      topiramate (TOPAMAX) 25 MG capsule Take 50 mg by mouth 2 (two) times daily.      traMADol (ULTRAM) 50 MG tablet Take 100 mg by mouth every 6 (six) hours as needed.     atorvastatin (LIPITOR) 10 MG tablet Take 5 mg by mouth daily at 6 PM.     furosemide (LASIX) 80 MG tablet Take 1 tablet (80 mg total) by mouth daily.     gabapentin (NEURONTIN) 300 MG capsule Take 300 mg by mouth at  bedtime. (Patient not taking: Reported on 12/21/2021)     scopolamine (TRANSDERM-SCOP) 1 MG/3DAYS SMARTSIG:1 Patch(s) Topical Every 3 Days PRN (Patient not taking: Reported on 12/21/2021)     No current facility-administered medications for this visit.     Physical Exam: BP 108/61   Pulse (!) 59   Resp 20   Ht '5\' 7"'$  (1.702 m)   Wt 250 lb (113.4 kg)   SpO2 97% Comment: RA  BMI 39.16 kg/m  He looks well. Cardiac exam shows regular rate and rhythm with normal heart sounds.  There is no murmur. Lungs are clear. There is no peripheral edema.  Diagnostic Tests:  Narrative & Impression  CLINICAL DATA:  Ascending aortic aneurysm, follow-up   EXAM: CT ANGIOGRAPHY CHEST WITH CONTRAST   TECHNIQUE: Multidetector CT imaging of the chest was performed using the standard protocol during bolus administration of intravenous contrast. Multiplanar CT image reconstructions and MIPs were obtained to evaluate the vascular anatomy.   RADIATION DOSE REDUCTION: This exam was performed according to the departmental dose-optimization program which includes automated exposure control, adjustment of the mA and/or kV according to patient size and/or use of iterative reconstruction technique.   CONTRAST:  48m OMNIPAQUE IOHEXOL 350 MG/ML SOLN   COMPARISON:  12/22/2020   FINDINGS: Cardiovascular: Heart size upper limits normal. No pericardial effusion. The RV is nondilated. Dilated central pulmonary arteries suggesting pulmonary hypertension. Satisfactory opacification of pulmonary arteries noted, and there is no evidence of pulmonary emboli. Scattered coronary calcifications. Good contrast opacification of the thoracic aorta without evidence of dissection or stenosis. Bovine variant brachiocephalic arterial origin anatomy.   Aortic Root:   --Valve: 2.6 cm   --Sinuses: 5.4 cm   --Sinotubular Junction: 4.7 cm   Limitations by motion: Mild   Thoracic Aorta:   --Ascending Aorta: 4.9 cm  (stable since previous)   --Aortic Arch: 4.4 cm   --Descending Aorta: 2.5 cm at the isthmus, 3.5 cm proximal descending, 2.9 cm distal descending   Mediastinum/Nodes: No hilar or mediastinal adenopathy.   Lungs/Pleura: No pleural effusion. No pneumothorax. Linear scarring or atelectasis in the anterior segment right upper lobe. 4 mm nodule in the right middle lobe (Im83,Se6) , stable since 09/15/2016 consistent with benign lesion; no follow-up indicated.   Upper Abdomen: Post gastric sleeve placement. Cholecystectomy clips. No acute findings.   Musculoskeletal: Anterior vertebral endplate spurring at multiple levels in the mid and lower thoracic spine. No acute findings.   Review of the MIP images confirms the above findings.   IMPRESSION: 1. Dilated central pulmonary arteries suggesting pulmonary hypertension. No evidence  of pulmonary embolism. 2. Coronary artery disease. 3. Post gastric sleeve placement. 4. Stable 4.9 cm ascending thoracic aortic aneurysm without complicating features. Recommend semi-annual imaging followup by CTA or MRA and referral to cardiothoracic surgery if not already obtained. This recommendation follows 2010 ACCF/AHA/AATS/ACR/ASA/SCA/SCAI/SIR/STS/SVM Guidelines for the Diagnosis and Management of Patients With Thoracic Aortic Disease. Circulation. 2010; 121: e266-e369 5. Stable 4 mm right middle lobe pulmonary nodule. 6. Coronary calcifications. The severity of coronary artery disease and any potential stenosis cannot be assessed on this non-contrast CT examination.     Electronically Signed   By: Lucrezia Europe M.D.   On: 11/30/2021 16:46      Impression:  This 81 year old gentleman has a stable aortic root and ascending aortic aneurysm measuring 5.4 cm at the aortic sinus level and 4.9 cm in the mid ascending aorta.  This is unchanged and below the surgical threshold of 5.5 cm.  Given his advanced age and multiple comorbid risk factors I do not  think he would be a good candidate for surgical treatment anyway.  I reviewed the CT images with him and his wife and answered their questions.  I stressed the importance of continued good blood pressure control in preventing further enlargement and acute aortic dissection.  I advised him against doing any heavy lifting that may require a Valsalva maneuver and could suddenly raise his blood pressure to high levels.  Plan:  I will see him back in 1 year with a CTA of the chest for aortic surveillance.   Gaye Pollack, MD [facility] [phone]

## 2021-12-22 DIAGNOSIS — Z6839 Body mass index (BMI) 39.0-39.9, adult: Secondary | ICD-10-CM | POA: Diagnosis not present

## 2021-12-22 DIAGNOSIS — G43909 Migraine, unspecified, not intractable, without status migrainosus: Secondary | ICD-10-CM | POA: Diagnosis not present

## 2021-12-29 NOTE — Progress Notes (Signed)
Cardiology Office Note    Date:  01/02/2022   ID:  Carlos Dawson, DOB [birth year], MRN [id number]  PCP:  Josetta Huddle, MD  Cardiologist: Dr. Breeze Angell Martinique   Chief Complaint  Patient presents with   Atrial Fibrillation    History of Present Illness:    Carlos Dawson is a 81 y.o. male with past medical history of CAD (s/p BMS to 2nd OM in 2012, low-risk NST in 12/2012), Cath in 2019 with nonobstructive disease, PAF (on Xarelto), chronic diastolic CHF, prior PE, HTN, HLD, Type 2 DM, and known thoracic aortic aneurysm who is seen for follow up.  He was admitted from 8/23 - 09/15/2016 for evaluation of chest discomfort and palpitations. His initial EKG showed that he was in atrial fibrillation with RVR. He reported good compliance with his Xarelto and denied missing any doses, therefore a successful DCCV was performed. He became hypotensive with SBP in the 80's following the procedure, therefore he was admitted overnight for further observation. His Losartan, Spironolactone, and Lasix were held at the time of discharge with plans to resume at follow-up if BP allowed. A CTA was also obtained during admission to rule out a recurrent PE and was negative for a PE but did show a stable 5.0 cm ascending thoracic aortic aneurysm. Antihypertensive therapy was resumed.   On follow up in February 2019   he noted increased symptoms of dyspnea on exertion and chest pressure for 3 months. Some improvement with inhalers.  No increase in edema or weight.  Oxygen levels were good. He does use CPAP. Myoview study was mildly abnormal with inferoapical reversible defect. EF 61%. Echo showed normal EF with moderate pulmonary HTN. Subsequent cardiac cath showed no obstructive CAD and no AI. Unable to cross AV due to distortion of the aortic root.  In April 2019 he developed increased symptoms of arrhythmia despite Tikosyn. He underwent EPS by Dr. Rayann Heman on 05/11/17. He had Afib and atrial flutter ablations. When  seen in follow up  he had significant improvement in Afib symptoms and some improvement in dyspnea. Multaq was discontinued.   On 09/26/17 he had CPX to evaluate his dyspnea. This showed predominant limitation by obesity and deconditioning. He was seen by Dr. Halford Chessman who recommended repeat Echo and if pulmonary pressures still elevated to consider right heart cath. Of note right heart cath in 2016 demonstrated mild pulmonary HTN with elevated PCWP c/w diastolic dysfunction. Diuretic therapy was increased at that time without significant clinical improvement.  He underwent THR in September 2020. Post op course complicated by blood loss anemia. He did develop Afib with rates into the 120s. Since then follow up in the Afib clinic demonstrated return of NSR. Last seen in AFib clinic on 11/24/19 and in NSR with PACs.   Last seen in Afib clinic in June and maintaining NSR. Was referred to Dr Quentin Ore for consideration of a Watchman device so he could come off anticoagulation and be able to take anti-inflammatories to manage his pain.  On follow up today he notes that he is scheduled to have some back surgery in January. He is no longer on lipitor due to joint pain.   Seeing Scoliosis and spine in [place]. May potentially have surgery to remove some of his hardware. He does have pain in low back radiating to his RLQ. He is still thinking about getting a Watchman device but wants to wait until after his surgery and his wife is also having foot surgery. He  still complains of SOB same as before.    Past Medical History:  Diagnosis Date   Anticoagulant long-term use    Aortic root enlargement (HCC)    Ascending aortic aneurysm ([place])    recent scan in October 2012 showing no change; followed by Dr. Servando Snare   ASCVD (arteriosclerotic cardiovascular disease)    Prior BMS to the 2nd OM in September 2012; with repeat cath in October showing patency   CAD (coronary artery disease)    a. s/p BMS to 2nd OM in Sept  2012; b. LexiScan Myoview (12/2012):  Inf infarct; bowel and motion artifact make study difficult to interpret; no ischemia; not gated; Low Risk   CHF (congestive heart failure) (HCC)    no recent issues 10/13/14   Chronic back pain    "all over my back" (05/11/2017)   Colonic polyp    Contact lens/glasses fitting    Diastolic dysfunction    DVT (deep venous thrombosis) ([place])    ?LLE   Frequent headaches    "probably weekly" (05/11/2017)   Generalized headaches    neck stenosis   GERD (gastroesophageal reflux disease)    Hearing loss    Hearing loss    more so on left   Hemorrhoids    History of stomach ulcers    Hypertension    IBS (irritable bowel syndrome)    LVH (left ventricular hypertrophy)    Mild intermittent asthma    OA (osteoarthritis)    "all over" (05/11/2017)   Obesities, morbid (HCC)    OSA (obstructive sleep apnea)    PSG 03/30/97 AHI 21, BPAP 13/9   OSA on CPAP    PAF (paroxysmal atrial fibrillation) ([place])    a. on Xarelto b. s/p DCCV in 08/2016; b. Tikosyn failed 04/16/17 with plans for Multaq and possible Afib ablation with Dr. Rayann Heman   Pneumonia    'several times" (05/11/2017)   Prostate CA [facility])    Oncologist  DR. Daralene Milch [facility] dx 09/24/14, undetermined tx   prostate; S/P "radiation and hormone injections"   Pulmonary embolism ([place]) 2008   "both lungs"   SOB (shortness of breath)    on excertion   Thoracic aortic aneurysm (HCC)    Aortic Size Index=     5.0    /Body surface area is 2.43 meters squared. = 2.05  < 2.75 cm/m2      4% risk per year 2.75 to 4.25          8% risk per year > 4.25 cm/m2    20% risk per year   Stable aneurysmal dilation of the ascending aorta with maximum AP diameter of 4.8 cm. Stable area of narrowing of the proximal most portion of the descending aorta measuring 2 cm., previously identified as an area of coarctation. No evidence of aortic dissection.  Coronary artery disease.  Normal appearance of the lungs.   Electronically Signed    By: Fidela Salisbury M.D.   On: 10/01/2014 08:50     Type II diabetes mellitus ([place])    metphormin, average 154 dx 2017    Past Surgical History:  Procedure Laterality Date   ACHILLES TENDON REPAIR Bilateral    AORTIC ARCH ANGIOGRAPHY N/A 03/13/2017   Procedure: AORTIC ARCH ANGIOGRAPHY;  Surgeon: Martinique, Arieliz Latino M, MD;  Location: [place] [facility];  Service: Cardiovascular;  Laterality: N/A;   APPENDECTOMY     ATRIAL FIBRILLATION ABLATION  05/11/2017   ATRIAL FIBRILLATION ABLATION N/A 05/11/2017   Procedure: ATRIAL FIBRILLATION  ABLATION;  Surgeon: [name], MD;  Location: [place] [facility];  Service: Cardiovascular;  Laterality: N/A;   BACK SURGERY     "I've had 7 back and 1 neck ORs" (05/11/2017)   BIOPSY  03/14/2018   Procedure: BIOPSY;  Surgeon: Ronnette Juniper, MD;  Location: [facility];  Service: Gastroenterology;;  EGD and Colon   CARDIAC CATHETERIZATION  2006   CARPAL TUNNEL RELEASE Bilateral    LEFT   CATARACT EXTRACTION W/ INTRAOCULAR LENS  IMPLANT, BILATERAL Bilateral    CERVICAL SPINE SURGERY  06/02/2010   lower back and neck   COLONOSCOPY N/A 03/14/2018   Procedure: COLONOSCOPY;  Surgeon: Ronnette Juniper, MD;  [facility]: [facility];  Service: Gastroenterology;  Laterality: N/A;   COLONOSCOPY WITH PROPOFOL N/A 12/29/2014   Procedure: COLONOSCOPY WITH PROPOFOL;  Surgeon: Garlan Fair, MD;  [facility]: [facility];  Service: Endoscopy;  Laterality: N/A;   CORONARY ANGIOPLASTY WITH STENT PLACEMENT  October 2012   CORONARY STENT PLACEMENT  Sept 2012   2nd OM with BMS   ESOPHAGOGASTRODUODENOSCOPY N/A 03/14/2018   Procedure: ESOPHAGOGASTRODUODENOSCOPY (EGD);  Surgeon: Ronnette Juniper, MD;  Location: [facility];  Service: Gastroenterology;  Laterality: N/A;   HEMORROIDECTOMY     LAMINECTOMY  05/30/2012   L 4 L5   LAMINECTOMY WITH POSTERIOR LATERAL ARTHRODESIS LEVEL 3 N/A 10/18/2016   Procedure: Posterior Lateral Fusion - Lumbar One-Four, segmental instrumentation Lumbar One-Five,   decompression,;  Surgeon: Eustace Moore, MD;  Location: [place];  Service: Neurosurgery;  Laterality: N/A;   LAPAROSCOPIC CHOLECYSTECTOMY     LAPAROSCOPIC GASTRIC BANDING     LEFT AND RIGHT HEART CATHETERIZATION WITH CORONARY ANGIOGRAM N/A 05/07/2014   Procedure: LEFT AND RIGHT HEART CATHETERIZATION WITH CORONARY ANGIOGRAM;  Surgeon: Mavrick Mcquigg M Martinique, MD;  Location: [facility];  Service: Cardiovascular;  Laterality: N/A;   LEFT HEART CATH AND CORONARY ANGIOGRAPHY N/A 03/13/2017   Procedure: LEFT HEART CATH AND CORONARY ANGIOGRAPHY;  Surgeon: Martinique, Yahia Bottger M, MD;  Location: [place] [facility];  Service: Cardiovascular;  Laterality: N/A;   LUMBAR LAMINECTOMY/DECOMPRESSION MICRODISCECTOMY N/A 05/04/2016   Procedure: Laminectomy and Foraminotomy - Thoracic twelve-Lumbar one -Posterior Fusion Lumbar one-two;  Surgeon: Eustace Moore, MD;  Location: [place];  Service: Neurosurgery;  Laterality: N/A;   POLYPECTOMY  03/14/2018   Procedure: POLYPECTOMY;  Surgeon: Ronnette Juniper, MD;  [facility];  Service: Gastroenterology;;   POSTERIOR LUMBAR FUSION  10/18/2016   SHOULDER OPEN ROTATOR CUFF REPAIR Bilateral    TONSILLECTOMY AND ADENOIDECTOMY     TOTAL HIP ARTHROPLASTY Right 10/18/2018   Procedure: RIGHT TOTAL HIP ARTHROPLASTY ANTERIOR APPROACH;  Surgeon: Mcarthur Rossetti, MD;  Location: [facility];  Service: Orthopedics;  Laterality: Right;   TRIGGER FINGER RELEASE     LEFT   UVULOPALATOPHARYNGOPLASTY     VASECTOMY      Current Medications: Outpatient Medications Prior to Visit  Medication Sig Dispense Refill   acetaminophen (TYLENOL) 500 MG tablet Take 1,000 mg by mouth every 8 (eight) hours as needed for mild pain or headache.      albuterol (VENTOLIN HFA) 108 (90 Base) MCG/ACT inhaler Inhale 2 puffs into the lungs every 4 (four) hours as needed for wheezing or shortness of breath. 18 g 1   augmented betamethasone dipropionate (DIPROLENE-AF) 0.05 % cream Apply 1 application topically 2 (two)  times daily as needed for itching.  3   budesonide-formoterol (SYMBICORT) 160-4.5 MCG/ACT inhaler Inhale 2 puffs into the lungs 2 (two) times daily. As needed  Cholecalciferol (VITAMIN D-3) 5000 units TABS Take 5,000 Units by mouth daily.     Coenzyme Q10 100 MG capsule Take 100 mg by mouth daily.      dextromethorphan-guaiFENesin (MUCINEX DM) 30-600 MG 12hr tablet Take 2 tablets by mouth as needed.     diphenoxylate-atropine (LOMOTIL) 2.5-0.025 MG tablet Take 1 tablet by mouth 4 (four) times daily as needed.     fluticasone (FLONASE) 50 MCG/ACT nasal spray Place 1 spray into both nostrils as needed for allergies or rhinitis.     furosemide (LASIX) 80 MG tablet Take 1 tablet (80 mg total) by mouth daily.     JARDIANCE 25 MG TABS tablet Take 1 tablet by mouth daily.     losartan (COZAAR) 25 MG tablet TAKE 1 TABLET BY MOUTH EVERY DAY 90 tablet 3   methocarbamol (ROBAXIN) 500 MG tablet Take 1 tablet (500 mg total) by mouth every 8 (eight) hours as needed for muscle spasms. 60 tablet 1   metoprolol tartrate (LOPRESSOR) 25 MG tablet Take 12.5 mg by mouth 2 (two) times daily.     nitroGLYCERIN (NITROSTAT) 0.4 MG SL tablet Place 1 tablet (0.4 mg total) under the tongue every 5 (five) minutes as needed for chest pain. 25 tablet 5   pantoprazole (PROTONIX) 40 MG tablet TAKE ONE TABLET BY MOUTH BEFORE BREAKFAST (TAKE ON AN EMPTY STOMACH 30 MINUTES PRIOR TO A MEAL)     Polyethyl Glycol-Propyl Glycol (SYSTANE OP) Place 1 drop into both eyes daily as needed (for dry eyes).      potassium chloride SA (KLOR-CON M20) 20 MEQ tablet Taking two tablet by mouth in the am and 1 tablet in the evening     pregabalin (LYRICA) 50 MG capsule Take 50 mg by mouth 2 (two) times daily.     psyllium (METAMUCIL) 58.6 % packet Take 1 packet by mouth daily.     rivaroxaban (XARELTO) 20 MG TABS tablet Take 1 tablet (20 mg total) by mouth daily with supper. 30 tablet    sildenafil (VIAGRA) 100 MG tablet Take 1 tablet (100 mg  total) by mouth as needed for erectile dysfunction. 10 tablet 6   Skin Protectants, Misc. (EUCERIN) cream Apply 1 application topically as needed for dry skin.     spironolactone (ALDACTONE) 25 MG tablet Take 25 mg by mouth daily.     tamsulosin (FLOMAX) 0.4 MG CAPS Take 0.4 mg by mouth every evening.      topiramate (TOPAMAX) 25 MG capsule Take 50 mg by mouth 2 (two) times daily.      traMADol (ULTRAM) 50 MG tablet Take 100 mg by mouth every 6 (six) hours as needed.     atorvastatin (LIPITOR) 10 MG tablet Take 5 mg by mouth daily at 6 PM. (Patient not taking: Reported on 01/02/2022)     gabapentin (NEURONTIN) 300 MG capsule Take 300 mg by mouth at bedtime. (Patient not taking: Reported on 12/21/2021)     loperamide (IMODIUM) 2 MG capsule Take 1 capsule (2 mg total) by mouth 2 (two) times daily as needed for diarrhea or loose stools. (Patient not taking: Reported on 01/02/2022) 14 capsule 0   scopolamine (TRANSDERM-SCOP) 1 MG/3DAYS SMARTSIG:1 Patch(s) Topical Every 3 Days PRN (Patient not taking: Reported on 12/21/2021)     No facility-administered medications prior to visit.     Allergies:   Other, Lotensin [benazepril], Tofranil [imipramine], Ace inhibitors, Adhesive [tape], Amoxicillin-pot clavulanate, Codeine, Latex, Metformin, Morphine, Nifedipine, and Quinolones   Social History   Socioeconomic  History   Marital status: Married    Spouse name: Not on file   Number of children: 3   Years of education: Not on file   Highest education level: Not on file  Occupational History   Occupation: Retired from Scientist, clinical (histocompatibility and immunogenetics): RETIRED  Tobacco Use   Smoking status: Former    Packs/day: 1.50    Years: 30.00    Total pack years: 45.00    Types: Cigarettes    Start date: 1958    Quit date: 01/24/1992    Years since quitting: 29.9   Smokeless tobacco: Never  Vaping Use   Vaping Use: Never used  Substance and Sexual Activity   Alcohol use: No   Drug use: No   Sexual activity: Not  Currently  Other Topics Concern   Not on file  Social History Narrative   Not on file   Social Determinants of Health   Financial Resource Strain: Not on file  Food Insecurity: Not on file  Transportation Needs: Not on file  Physical Activity: Not on file  Stress: Not on file  Social Connections: Not on file     Family History:  The patient's family history includes Diabetes in his mother; Emphysema (age of onset: 31) in his father; Heart attack in his sister; Heart disease in his mother; Other in his mother.   Review of Systems:   Please see the history of present illness.    All other systems reviewed and are otherwise negative except as noted above.   Physical Exam:    VS:  BP 112/66   Pulse (!) 51   Ht '5\' 7"'$  (1.702 m)   Wt 261 lb 3.2 oz (118.5 kg)   SpO2 99%   BMI 40.91 kg/m    GENERAL:  Well appearing,  obese WM in NAD HEENT:  PERRL, EOMI, sclera are clear. Oropharynx is clear. NECK:  No jugular venous distention, carotid upstroke brisk and symmetric, no bruits, no thyromegaly or adenopathy LUNGS:  Clear to auscultation bilaterally CHEST:  Unremarkable HEART:  RRR,  PMI not displaced or sustained,S1 and S2 within normal limits, no S3, no S4: no clicks, no rubs, no murmurs ABD:  Soft, nontender. BS +, no masses or bruits. No hepatomegaly, no splenomegaly EXT:  2 + pulses throughout, 1+ edema, no cyanosis no clubbing SKIN:  Warm and dry.  No rashes NEURO:  Alert and oriented x 3. Cranial nerves II through XII intact. PSYCH:  Cognitively intact        Wt Readings from Last 3 Encounters:  01/02/22 261 lb 3.2 oz (118.5 kg)  12/21/21 250 lb (113.4 kg)  08/18/21 251 lb (113.9 kg)      Studies/Labs Reviewed:   EKG:  EKG is not ordered today.    Recent Labs: 03/10/2021: BUN 22; Hemoglobin 12.0; Platelets 112; Potassium 4.5; Sodium 141 11/30/2021: Creatinine, Ser 1.30   Lipid Panel    Component Value Date/Time   CHOL 118 01/07/2013 0410   TRIG 133  01/07/2013 0410   HDL 40 01/07/2013 0410   CHOLHDL 3.0 01/07/2013 0410   VLDL 27 01/07/2013 0410   LDLCALC 51 01/07/2013 0410   Labs dated 07/11/16: cholesterol 114, triglycerides 80, HDL 46, LDL 52. A1c 7.1%.  Dated 01/24/17: cholesterol 110, triglycerides 50, HDL 52, LDL 48. A1c 7.8%.  Dated 07/16/17: cholesterol 106, triglycerides 72, HDL 50, LDL 41. Creatinine 1.36. Hgb 12.8. Other chemistries and TSH normal.  Dated 10/10/18: cholesterol 100, triglycerides 54, HDL 51,  LDL 38.  Dated 10/28/19: cholesterol 97, triglycerides 35, HDL 53, LDL 38. Iron level 125. A1c 6.7 %. TSH normal.  Dated 11/26/19: creatinine 1.19. glucose 122. Hgb 12.0. plts 125K. Other chemistries normal.  Dated 11/15/20: cholesterol 137, triglycerides 65, HDL 78, LDL 46. A1c 7.3%. CBC, CMET, and TSH normal. Dated 07/15/21: A1c 7.1%. cholesterol 108, triglycerides 85, HDL 55, LDL 36. CMET normal. Dated 11/07/21: A1c 7.3%. cholesterol 147, triglycerides 53, HDL 61, LDL 74. CMET normal.   Additional studies/ records that were reviewed today include:   Myoview 03/06/17: Study Highlights   The left ventricular ejection fraction is normal (55-65%). Nuclear stress EF: 61%. There is a small defect of mild severity present in the basal inferior, mid inferior and apex location. The defect is partially reversible. Overall image quality is poor due to soft tissue attenuation. Cannot rule out a small area of ischemia. There is evidence of transient ischemic dilatation with a TID of 1.27. This is an intermediate risk study. There was no ST segment deviation noted during stress    Echo 03/07/17: Study Conclusions   - Left ventricle: The cavity size was normal. Wall thickness was   increased in a pattern of mild LVH. Systolic function was normal.   The estimated ejection fraction was in the range of 55% to 60%.   Wall motion was normal; there were no regional wall motion   abnormalities. Left ventricular diastolic function parameters    were normal. - Aortic valve: There was very mild stenosis. Valve area (VTI):   1.81 cm^2. Valve area (Vmax): 1.94 cm^2. Valve area (Vmean): 1.85   cm^2. - Right atrium: The atrium was mildly dilated. - Atrial septum: No defect or patent foramen ovale was identified. - Pulmonary arteries: PA peak pressure: 54 mm Hg (S).  Cardiac cath 03/15/17: Conclusion     Previously placed Ost 2nd Mrg to 2nd Mrg stent (unknown type) is widely patent. There is no aortic valve regurgitation.   1. No significant obstructive CAD 2. Aneurysmal thoracic aorta.    Plan: continue medical management . Consider pulmonary evaluation for symptoms of dyspnea.       Referred for: Dyspnea on exertion   Procedure: This patient underwent staged symptom-limited exercise testing using an individualized bike protocol with expired gas analysis metabolic evaluation during exercise.  Demographics  Age: 36 Ht. (in.) 67.0 Wt. (lb) 272.4 BMI: 42.7      Predicted Peak VO2: 16.0  Gender: Male Ht (cm) 170.2 Wt. (kg) 123.6    Results  Pre-Exercise PFTs   FVC 2.01 (54%)       FEV1 1.21 (43%)         FEV1/FVC 60         MVV 68 (60%)        Exercise Time:    7:00      Watts: 60  RPE: 15  Reason stopped: patient ended test due to dyspnea (9/10)  Additional symptoms: chest tightness (6/10) and lightheaded (5/10)  Resting HR: 55 Peak HR: 107   (49% age predicted max HR)  BP rest: 122/70 BP peak: 170/82  Peak VO2: 11.4 (71% predicted peak VO2)  VE/VCO2 slope:  40  OUES: 1.82  Peak RER: 0.96  Ventilatory Threshold: 8.7 (54% predicted and 76% measured peak VO2)  Peak RR 36  Peak Ventilation:  52.2  VE/MVV:  77%  PETCO2 at peak:  30  O2pulse:  13   (93% predicted O2pulse)   Interpretation  Notes: Patient gave a good  effort although he was dyspneic. Pulse-oximetry at rest 97% with a low of 93% at peak exercise. Exercise was performed on a cycle ergometer starting at [facility] and increasing by  10W/min.   ECG:   Resting ECG in sinus bradycardia with 1st degree AV block and incomplete right bundle branch block. HR response blunted. There were frequent PVCs at rest with suppression during exercise with no sustained arrhythmias and no ST-T changes. BP response appropriate.   PFT:  Pre-exercise spirometry demonstrates severe obstruction. MVV below normal range.    CPX:  Exercise Capacity- Exercise testing with gas exchange demonstrates a mildly reduced peak VO2 of 11.4 ml/kg/min (71% of the age/gender/weight matched sedentary norms). The RER of 0.96 indicates a submaximal effort. When adjusted to the patient's ideal body weight of 162.6 lb (73.7 kg) the peak VO2 is 16.0 ml/kg (ibw)/min (70% of the ibw-adjusted predicted).   Cardiovascular response- The O2pulse (a surrogate for strokevolume) increased with incremental exercise reaching peak of 13 ml/beat (93% predicted). DeltaVO2/Delta WR is 8.4 Indicating no clear evidence of cardiovascular impairment.  Ventilatory response- The VE/VCO2 slope is elevated and indicates Increased dead space ventilation. The oxygen uptake efficiency slope (OUES) is 1.82. The VO2 at the ventilatory threshold was normal at 54% of the predicted peak VO2 and 76% measured PVO2. At peak exercise, the ventilation reached 54% of the measured MVV and breathing reserve was 16 indicating ventilatory reserve was nearly depleted.  PETCO2 was normal at 16mHg during exercise.   Conclusion: The interpretation of this test is limited due to submaximal effort during the exercise. Based on available data, exercise testing with gas exchange demonstrates mild functional impairment when compared to matched sedentary norms. Suspect dyspnea is multifactorial, there is evidence of effect of deconditioning and primary limitation due to body habitus.   In addition he had inadequate heart rate response to exercise and a state of hyperventilation at peak exercise, which induced an elevated  VE/VCO2 slope. He approached depletion of ventilatory reserve which may be due to his obstructive lung disease.  Test, report and preliminary impression by: KLandis Martins MS, ACSM-RCEP 09/26/2017 3:03 PM  FINALIZED PMarshell GarfinkelMD [facility] 10/08/2017, 10:04 AM  Assessment:    1. Paroxysmal atrial fibrillation (HCC)   2. Chronic diastolic heart failure ([place]   3. Coronary artery disease involving native coronary artery of native heart without angina pectoris   4. Essential hypertension   5. Obstructive sleep apnea        Plan:   In order of problems listed above:  1. Paroxysmal Atrial Fibrillation/ Long-term Anticoagulation - s/p DCCV in August 2018.  - s/p Afib and Aflutter ablation in April 2019. Now off antiarrhythmic therapy. - Maintaining NSR - This patients CHA2DS2-VASc Score and unadjusted Ischemic Stroke Rate (% per year) is equal to 9.7 % stroke rate/year from a score of 6 (HTN, DM, Vascular, Age, TE (2)). He denies any evidence of active bleeding. Continue Xarelto for anticoagulation. I think placing a Watchman device is a reasonable option since he would then be able to come off Xarelto and be able to take NSAIDS for his multiple orthopedic issues. Patient will contact Dr LQuentin Orewhen ready.    2. CAD - s/p BMS to 2nd OM in 2012, cardiac cath Feb 2019 showed continued patency with nonobstructive disease. - asymptomatic.  - No ASA secondary to need for Xarelto.  3. Chronic diastolic CHF - echo in [address]showed a preserved EF of 55-60%. He has increased  edema and weight due to increased sodium intake. -. Note right heart findings in 2016.  - Now on Jardiance  - continue current lasix and aldactone - on losartan - Echo in Feb with normal EF - reinforce need for sodium restriction. - he has had extensive evaluation of his dyspnea over the past 3-4 years. This is multifactorial with diastolic CHF, obesity, deconditioning, OSA.   4.  HTN - BP is well-controlled  - continue  Current therapy  5. HLD -  LDL 74  - intolerant of statins  6. Thoracic aortic aneurysm  - stable at 4.9 cm by recent  CT Scan. - followed by Dr. Cyndia Bent. with CT Surgery.   7. Chronic respiratory failure due to morbid obesity and OSA. Followed by pulmonary. - recent CPX suggests major limitation is deconditioning and body habitus. Improved with weight loss.   8. Pulmonary HTN secondary to #3 and #7.  Cardiac cath in 2016 showed mild pulmonary venous HTN. Last Echo in Feb showed mild pulmonary HTN    Signed, Lior Cartelli Martin[place], MD,FACC 01/02/2022 3:09 PM    [facility] [address], [place], [place] [address] Phone: [phone]

## 2022-01-02 ENCOUNTER — Encounter: Payer: Self-pay | Admitting: Cardiology

## 2022-01-02 ENCOUNTER — Ambulatory Visit: Payer: Medicare Other | Attending: Cardiology | Admitting: Cardiology

## 2022-01-02 VITALS — BP 112/66 | HR 51 | Ht 67.0 in | Wt 261.2 lb

## 2022-01-02 DIAGNOSIS — G4733 Obstructive sleep apnea (adult) (pediatric): Secondary | ICD-10-CM | POA: Diagnosis not present

## 2022-01-02 DIAGNOSIS — I2510 Atherosclerotic heart disease of native coronary artery without angina pectoris: Secondary | ICD-10-CM | POA: Insufficient documentation

## 2022-01-02 DIAGNOSIS — I5032 Chronic diastolic (congestive) heart failure: Secondary | ICD-10-CM | POA: Diagnosis not present

## 2022-01-02 DIAGNOSIS — I480 Paroxysmal atrial fibrillation: Secondary | ICD-10-CM | POA: Insufficient documentation

## 2022-01-02 DIAGNOSIS — I10 Essential (primary) hypertension: Secondary | ICD-10-CM | POA: Diagnosis not present

## 2022-01-03 ENCOUNTER — Ambulatory Visit (HOSPITAL_COMMUNITY)
Admission: RE | Admit: 2022-01-03 | Discharge: 2022-01-03 | Disposition: A | Discharge status: Home / Self Care | Payer: Medicare Other | Source: Ambulatory Visit | Attending: Nurse Practitioner | Admitting: Nurse Practitioner

## 2022-01-03 VITALS — BP 104/52 | HR 53 | Ht 67.0 in | Wt 261.4 lb

## 2022-01-03 DIAGNOSIS — R609 Edema, unspecified: Secondary | ICD-10-CM | POA: Insufficient documentation

## 2022-01-03 DIAGNOSIS — I509 Heart failure, unspecified: Secondary | ICD-10-CM | POA: Insufficient documentation

## 2022-01-03 DIAGNOSIS — I4819 Other persistent atrial fibrillation: Secondary | ICD-10-CM | POA: Diagnosis not present

## 2022-01-03 DIAGNOSIS — M1990 Unspecified osteoarthritis, unspecified site: Secondary | ICD-10-CM | POA: Insufficient documentation

## 2022-01-03 DIAGNOSIS — I110 Hypertensive heart disease with heart failure: Secondary | ICD-10-CM | POA: Insufficient documentation

## 2022-01-03 DIAGNOSIS — I480 Paroxysmal atrial fibrillation: Secondary | ICD-10-CM

## 2022-01-03 DIAGNOSIS — Z7901 Long term (current) use of anticoagulants: Secondary | ICD-10-CM | POA: Insufficient documentation

## 2022-01-03 DIAGNOSIS — D6869 Other thrombophilia: Secondary | ICD-10-CM | POA: Diagnosis not present

## 2022-01-03 NOTE — Progress Notes (Signed)
Primary Care Physician: Josetta Huddle, MD Referring Physician: Dr. Rubie Maid is a 81 y.o. male with a h/o  persistent afib/flutter with afib ablation  in 04/2017. He has no  complaints today except for  some intermittent swelling of his feet. He reports that he is not compliant  with a low salt diet. "I like salt." He has been told he cat take extra laix as needed by Dr. Martinique. He hs not noted any irregular heart beat.  EKG today shows Sinus brady.   F/u in afib clinic, 07/06/21. He reports that he is doing well. EKG shows sinus brady at 48 bpm, however, Kardia strips reviewed and he has HR's in SR in  the 60's to 80's. One strip recently read afib but it was artifact with SR and PC's. He remains on xarelto at the correct dose per crcl cal at 86. No bleeding concerns, but pain clinic one week ago put him on Celebrex 200 mg 2x a day for back  pain 2/2 to arthritis. He states that tylenol nor tramadol  touch his pain. We dicussed with xarelto this could predispose him for a GI bleed and not the best choice to take  daily together. I discussed Watchman with him  and he is interested so he can better manage his pain. In the interim, he will stop daily celebrex.    F/u in afib clinic, 01/03/22. He did see Dr. Quentin Ore in July  to consider Watchman but he has deferred so far. He is having back surgery end of January and he is thinking he will go ahead with the Watchman after he recoups form that surgery. He thinks he may need surgery on his rt elbow as well. He does have a lot of arthritic pain  and he  would like to be able to use antiinflammatories to help control. His watch often times will show afib but he does not feel bad and does not note any increase in heart rate so I think PAC's probably which is shown on today's EKG with a reading of afib.    Today, he denies symptoms of palpitations, chest pain, shortness of breath, orthopnea, PND, lower extremity edema, dizziness, presyncope,  syncope, or neurologic sequela. The patient is tolerating medications without difficulties and is otherwise without complaint today.   Past Medical History:  Diagnosis Date   Anticoagulant long-term use    Aortic root enlargement (HCC)    Ascending aortic aneurysm ([place])    recent scan in October 2012 showing no change; followed by Dr. Servando Snare   ASCVD (arteriosclerotic cardiovascular disease)    Prior BMS to the 2nd OM in September 2012; with repeat cath in October showing patency   CAD (coronary artery disease)    a. s/p BMS to 2nd OM in Sept 2012; b. LexiScan Myoview (12/2012):  Inf infarct; bowel and motion artifact make study difficult to interpret; no ischemia; not gated; Low Risk   CHF (congestive heart failure) (HCC)    no recent issues 10/13/14   Chronic back pain    "all over my back" (05/11/2017)   Colonic polyp    Contact lens/glasses fitting    Diastolic dysfunction    DVT (deep venous thrombosis) ([place])    ?LLE   Frequent headaches    "probably weekly" (05/11/2017)   Generalized headaches    neck stenosis   GERD (gastroesophageal reflux disease)    Hearing loss    Hearing loss    more  so on left   Hemorrhoids    History of stomach ulcers    Hypertension    IBS (irritable bowel syndrome)    LVH (left ventricular hypertrophy)    Mild intermittent asthma    OA (osteoarthritis)    "all over" (05/11/2017)   Obesities, morbid (HCC)    OSA (obstructive sleep apnea)    PSG 03/30/97 AHI 21, BPAP 13/9   OSA on CPAP    PAF (paroxysmal atrial fibrillation) ([place])    a. on Xarelto b. s/p DCCV in 08/2016; b. Tikosyn failed 04/16/17 with plans for Multaq and possible Afib ablation with Dr. Rayann Heman   Pneumonia    'several times" (05/11/2017)   Prostate CA [facility])    Oncologist  DR. Daralene Milch [facility] 09/24/14, undetermined tx   prostate; S/P "radiation and hormone injections"   Pulmonary embolism ([place]) 2008   "both lungs"   SOB (shortness of breath)    on excertion   Thoracic  aortic aneurysm (HCC)    Aortic Size Index=     5.0    /Body surface area is 2.43 meters squared. = 2.05  < 2.75 cm/m2      4% risk per year 2.75 to 4.25          8% risk per year > 4.25 cm/m2    20% risk per year   Stable aneurysmal dilation of the ascending aorta with maximum AP diameter of 4.8 cm. Stable area of narrowing of the proximal most portion of the descending aorta measuring 2 cm., previously identified as an area of coarctation. No evidence of aortic dissection.  Coronary artery disease.  Normal appearance of the lungs.   Electronically Signed   By: Fidela Salisbury M.D.   On: 10/01/2014 08:50     Type II diabetes mellitus ([place])    metphormin, average 154 dx 2017   Past Surgical History:  Procedure Laterality Date   ACHILLES TENDON REPAIR Bilateral    AORTIC ARCH ANGIOGRAPHY N/A 03/13/2017   Procedure: AORTIC ARCH ANGIOGRAPHY;  Surgeon: Martinique, [name], MD;  Location: [facility];  Service: Cardiovascular;  Laterality: N/A;   APPENDECTOMY     ATRIAL FIBRILLATION ABLATION  05/11/2017   ATRIAL FIBRILLATION ABLATION N/A 05/11/2017   Procedure: ATRIAL FIBRILLATION ABLATION;  Surgeon: [name], MD;  Location: [facility];  Service: Cardiovascular;  Laterality: N/A;   BACK SURGERY     "I've had 7 back and 1 neck ORs" (05/11/2017)   BIOPSY  03/14/2018   Procedure: BIOPSY;  Surgeon: Ronnette Juniper, MD;  Location: [facility];  Service: Gastroenterology;;  EGD and Colon   CARDIAC CATHETERIZATION  2006   CARPAL TUNNEL RELEASE Bilateral    LEFT   CATARACT EXTRACTION W/ INTRAOCULAR LENS  IMPLANT, BILATERAL Bilateral    CERVICAL SPINE SURGERY  06/02/2010   lower back and neck   COLONOSCOPY N/A 03/14/2018   Procedure: COLONOSCOPY;  Surgeon: Ronnette Juniper, MD;  Location: [facility];  Service: Gastroenterology;  Laterality: N/A;   COLONOSCOPY WITH PROPOFOL N/A 12/29/2014   Procedure: COLONOSCOPY WITH PROPOFOL;  Surgeon: Garlan Fair, MD;  Location: [facility];  Service:  Endoscopy;  Laterality: N/A;   CORONARY ANGIOPLASTY WITH STENT PLACEMENT  October 2012   CORONARY STENT PLACEMENT  Sept 2012   2nd OM with BMS   ESOPHAGOGASTRODUODENOSCOPY N/A 03/14/2018   Procedure: ESOPHAGOGASTRODUODENOSCOPY (EGD);  Surgeon: Ronnette Juniper, MD;  Location: [facility];  Service: Gastroenterology;  Laterality: N/A;   HEMORROIDECTOMY  LAMINECTOMY  05/30/2012   L 4 L5   LAMINECTOMY WITH POSTERIOR LATERAL ARTHRODESIS LEVEL 3 N/A 10/18/2016   Procedure: Posterior Lateral Fusion - Lumbar One-Four, segmental instrumentation Lumbar One-Five,  decompression,;  Surgeon: Eustace Moore, MD;  Location: [facility];  Service: Neurosurgery;  Laterality: N/A;   LAPAROSCOPIC CHOLECYSTECTOMY     LAPAROSCOPIC GASTRIC BANDING     LEFT AND RIGHT HEART CATHETERIZATION WITH CORONARY ANGIOGRAM N/A 05/07/2014   Procedure: LEFT AND RIGHT HEART CATHETERIZATION WITH CORONARY ANGIOGRAM;  Surgeon: [name] Martinique, MD;  Location: [facility];  Service: Cardiovascular;  Laterality: N/A;   LEFT HEART CATH AND CORONARY ANGIOGRAPHY N/A 03/13/2017   Procedure: LEFT HEART CATH AND CORONARY ANGIOGRAPHY;  Surgeon: Martinique, [name], MD;  Location: [facility];  Service: Cardiovascular;  Laterality: N/A;   LUMBAR LAMINECTOMY/DECOMPRESSION MICRODISCECTOMY N/A 05/04/2016   Procedure: Laminectomy and Foraminotomy - Thoracic twelve-Lumbar one -Posterior Fusion Lumbar one-two;  Surgeon: Eustace Moore, MD;  Location: [facility];  Service: Neurosurgery;  Laterality: N/A;   POLYPECTOMY  03/14/2018   Procedure: POLYPECTOMY;  Surgeon: Ronnette Juniper, MD;  Location: [facility];  Service: Gastroenterology;;   POSTERIOR LUMBAR FUSION  10/18/2016   SHOULDER OPEN ROTATOR CUFF REPAIR Bilateral    TONSILLECTOMY AND ADENOIDECTOMY     TOTAL HIP ARTHROPLASTY Right 10/18/2018   Procedure: RIGHT TOTAL HIP ARTHROPLASTY ANTERIOR APPROACH;  Surgeon: Mcarthur Rossetti, MD;  Location: [facility];  Service: Orthopedics;  Laterality: Right;   TRIGGER  FINGER RELEASE     LEFT   UVULOPALATOPHARYNGOPLASTY     VASECTOMY      Current Outpatient Medications  Medication Sig Dispense Refill   acetaminophen (TYLENOL) 500 MG tablet Take 1,000 mg by mouth every 8 (eight) hours as needed for mild pain or headache.      albuterol (VENTOLIN HFA) 108 (90 Base) MCG/ACT inhaler Inhale 2 puffs into the lungs every 4 (four) hours as needed for wheezing or shortness of breath. 18 g 1   augmented betamethasone dipropionate (DIPROLENE-AF) 0.05 % cream Apply 1 application topically 2 (two) times daily as needed for itching.  3   budesonide-formoterol (SYMBICORT) 160-4.5 MCG/ACT inhaler Inhale 2 puffs into the lungs 2 (two) times daily. As needed     Cholecalciferol (VITAMIN D-3) 5000 units TABS Take 5,000 Units by mouth daily.     Coenzyme Q10 100 MG capsule Take 100 mg by mouth daily.      dextromethorphan-guaiFENesin (MUCINEX DM) 30-600 MG 12hr tablet Take 2 tablets by mouth as needed.     diphenoxylate-atropine (LOMOTIL) 2.5-0.025 MG tablet Take 1 tablet by mouth 4 (four) times daily as needed.     fluticasone (FLONASE) 50 MCG/ACT nasal spray Place 1 spray into both nostrils as needed for allergies or rhinitis.     furosemide (LASIX) 80 MG tablet Take 1 tablet (80 mg total) by mouth daily.     JARDIANCE 25 MG TABS tablet Take 1 tablet by mouth daily.     losartan (COZAAR) 25 MG tablet TAKE 1 TABLET BY MOUTH EVERY DAY 90 tablet 3   methocarbamol (ROBAXIN) 500 MG tablet Take 1 tablet (500 mg total) by mouth every 8 (eight) hours as needed for muscle spasms. 60 tablet 1   metoprolol tartrate (LOPRESSOR) 25 MG tablet Take 12.5 mg by mouth 2 (two) times daily.     nitroGLYCERIN (NITROSTAT) 0.4 MG SL tablet Place 1 tablet (0.4 mg total) under the tongue every 5 (five) minutes as needed for chest pain.  25 tablet 5   pantoprazole (PROTONIX) 40 MG tablet TAKE ONE TABLET BY MOUTH BEFORE BREAKFAST (TAKE ON AN EMPTY STOMACH 30 MINUTES PRIOR TO A MEAL)     Polyethyl  Glycol-Propyl Glycol (SYSTANE OP) Place 1 drop into both eyes daily as needed (for dry eyes).      potassium chloride SA (KLOR-CON M20) 20 MEQ tablet Taking two tablet by mouth in the am and 1 tablet in the evening     pregabalin (LYRICA) 50 MG capsule Take 50 mg by mouth 2 (two) times daily.     psyllium (METAMUCIL) 58.6 % packet Take 1 packet by mouth daily.     rivaroxaban (XARELTO) 20 MG TABS tablet Take 1 tablet (20 mg total) by mouth daily with supper. 30 tablet    sildenafil (VIAGRA) 100 MG tablet Take 1 tablet (100 mg total) by mouth as needed for erectile dysfunction. 10 tablet 6   Skin Protectants, Misc. (EUCERIN) cream Apply 1 application topically as needed for dry skin.     spironolactone (ALDACTONE) 25 MG tablet Take 25 mg by mouth daily.     tamsulosin (FLOMAX) 0.4 MG CAPS Take 0.4 mg by mouth every evening.      topiramate (TOPAMAX) 25 MG capsule Take 50 mg by mouth 2 (two) times daily.      traMADol (ULTRAM) 50 MG tablet Take 100 mg by mouth every 6 (six) hours as needed.     No current facility-administered medications for this encounter.    Allergies  Allergen Reactions   Other     Other reaction(s): cough, Other Other reaction(s): cough  Other reaction(s): wt gain   Lotensin [Benazepril] Cough   Tofranil [Imipramine]     Weight gain   Ace Inhibitors Cough   Adhesive [Tape] Itching and Rash   Amoxicillin-Pot Clavulanate Other (See Comments)    Other reaction(s): GI Upset (intolerance)   Codeine Nausea Only    Other reaction(s): nausea Other reaction(s): nausea Other reaction(s): nausea    Latex Itching, Rash and Other (See Comments)    Bandaids   Metformin Diarrhea   Morphine Itching   Nifedipine Other (See Comments)    Other reaction(s): leg edema   Quinolones Other (See Comments)    Patient was warned about not using Cipro and similar antibiotics. Recent studies have raised concern that fluoroquinolone antibiotics could be associated with an increased  risk of aortic aneurysm Fluoroquinolones have non-antimicrobial properties that might jeopardise the integrity of the extracellular matrix of the vascular wall In a  propensity score matched cohort study in [place], there was a 66% increased rate of aortic aneurysm or dissection associated with oral fluoroquinolone use, compared wit    Social History   Socioeconomic History   Marital status: Married    Spouse name: Not on file   Number of children: 3   Years of education: Not on file   Highest education level: Not on file  Occupational History   Occupation: Retired from Scientist, clinical (histocompatibility and immunogenetics): RETIRED  Tobacco Use   Smoking status: Former    Packs/day: 1.50    Years: 30.00    Total pack years: 45.00    Types: Cigarettes    Start date: 1958    Quit date: 01/24/1992    Years since quitting: 29.9   Smokeless tobacco: Never  Vaping Use   Vaping Use: Never used  Substance and Sexual Activity   Alcohol use: No   Drug use: No   Sexual activity: Not Currently  Other Topics Concern   Not on file  Social History Narrative   Not on file   Social Determinants of Health   Financial Resource Strain: Not on file  Food Insecurity: Not on file  Transportation Needs: Not on file  Physical Activity: Not on file  Stress: Not on file  Social Connections: Not on file  Intimate Partner Violence: Not on file    Family History  Problem Relation Age of Onset   Heart disease Mother    Diabetes Mother    Other Mother        stent placement   Emphysema Father 42   Heart attack Sister     ROS- All systems are reviewed and negative except as per the HPI above  Physical Exam: Vitals:   01/03/22 0904  Weight: 118.6 kg  Height: '5\' 7"'$  (1.702 m)   Wt Readings from Last 3 Encounters:  01/03/22 118.6 kg  01/02/22 118.5 kg  12/21/21 113.4 kg    Labs: Lab Results  Component Value Date   NA 141 03/10/2021   K 4.5 03/10/2021   CL 108 (H) 03/10/2021   CO2 23 03/10/2021   GLUCOSE 130 (H)  03/10/2021   BUN 22 03/10/2021   CREATININE 1.30 (H) 11/30/2021   CALCIUM 8.8 03/10/2021   MG 2.2 04/18/2017   Lab Results  Component Value Date   INR 1.3 (H) 03/08/2017   Lab Results  Component Value Date   CHOL 118 01/07/2013   HDL 40 01/07/2013   LDLCALC 51 01/07/2013   TRIG 133 01/07/2013     GEN- The patient is well appearing, alert and oriented x 3 today.   Head- normocephalic, atraumatic Eyes-  Sclera clear, conjunctiva pink Ears- hearing intact Oropharynx- clear Neck- supple, no JVP Lymph- no cervical lymphadenopathy Lungs- Clear to ausculation bilaterally, normal work of breathing Heart- Regular rate and rhythm, no murmurs, rubs or gallops, PMI not laterally displaced GI- soft, NT, ND, + BS Extremities- no clubbing, cyanosis, or edema MS- no significant deformity or atrophy Skin- no rash or lesion Psych- euthymic mood, full affect Neuro- strength and sensation are intact  EKG- Sinus brady with first degree AV block with PAC's. Pr int 0.24 ms, qrs int 132 ms, qtc 439 ms   Epic records reviewed    Assessment and Plan: 1. Persistent  afib PAC's He appears to be maintaining SR   Continue  metoprolol tartrate at 12.5 mg bid   2. CHA2DS2VASc score of 6 Continue  xarelto 20 mg daily  3. HTN Stable No  change   4. Arthritic pain  Has seen Dr. Quentin Ore to discuss Watchman, wants to do this after back surgery pending in January Will discuss with Dr. Mardene Speak office when ready   5. Pedal edema Chronic, stable    F/u in 6 months   Butch Penny C. Alyxandra Tenbrink, [facility] [address] [place],  [address] [phone]

## 2022-01-20 DIAGNOSIS — E0822 Diabetes mellitus due to underlying condition with diabetic chronic kidney disease: Secondary | ICD-10-CM | POA: Diagnosis not present

## 2022-01-20 DIAGNOSIS — I480 Paroxysmal atrial fibrillation: Secondary | ICD-10-CM | POA: Diagnosis not present

## 2022-01-20 DIAGNOSIS — I10 Essential (primary) hypertension: Secondary | ICD-10-CM | POA: Diagnosis not present

## 2022-01-20 DIAGNOSIS — N183 Chronic kidney disease, stage 3 unspecified: Secondary | ICD-10-CM | POA: Diagnosis not present

## 2022-01-20 DIAGNOSIS — E785 Hyperlipidemia, unspecified: Secondary | ICD-10-CM | POA: Diagnosis not present

## 2022-01-20 DIAGNOSIS — K219 Gastro-esophageal reflux disease without esophagitis: Secondary | ICD-10-CM | POA: Diagnosis not present

## 2022-01-20 DIAGNOSIS — N400 Enlarged prostate without lower urinary tract symptoms: Secondary | ICD-10-CM | POA: Diagnosis not present

## 2022-01-20 DIAGNOSIS — I5030 Unspecified diastolic (congestive) heart failure: Secondary | ICD-10-CM | POA: Diagnosis not present

## 2022-01-27 DIAGNOSIS — M4714 Other spondylosis with myelopathy, thoracic region: Secondary | ICD-10-CM | POA: Diagnosis not present

## 2022-01-27 DIAGNOSIS — M4804 Spinal stenosis, thoracic region: Secondary | ICD-10-CM | POA: Diagnosis not present

## 2022-01-27 DIAGNOSIS — M963 Postlaminectomy kyphosis: Secondary | ICD-10-CM | POA: Diagnosis not present

## 2022-01-27 DIAGNOSIS — M4726 Other spondylosis with radiculopathy, lumbar region: Secondary | ICD-10-CM | POA: Diagnosis not present

## 2022-01-31 DIAGNOSIS — M40204 Unspecified kyphosis, thoracic region: Secondary | ICD-10-CM | POA: Diagnosis present

## 2022-01-31 DIAGNOSIS — I2510 Atherosclerotic heart disease of native coronary artery without angina pectoris: Secondary | ICD-10-CM | POA: Diagnosis present

## 2022-01-31 DIAGNOSIS — I480 Paroxysmal atrial fibrillation: Secondary | ICD-10-CM | POA: Diagnosis present

## 2022-01-31 DIAGNOSIS — M4724 Other spondylosis with radiculopathy, thoracic region: Secondary | ICD-10-CM | POA: Diagnosis present

## 2022-01-31 DIAGNOSIS — M4714 Other spondylosis with myelopathy, thoracic region: Secondary | ICD-10-CM | POA: Diagnosis present

## 2022-01-31 DIAGNOSIS — M963 Postlaminectomy kyphosis: Secondary | ICD-10-CM | POA: Diagnosis not present

## 2022-01-31 DIAGNOSIS — M47817 Spondylosis without myelopathy or radiculopathy, lumbosacral region: Secondary | ICD-10-CM | POA: Diagnosis present

## 2022-01-31 DIAGNOSIS — M4804 Spinal stenosis, thoracic region: Secondary | ICD-10-CM | POA: Diagnosis present

## 2022-01-31 DIAGNOSIS — Z87891 Personal history of nicotine dependence: Secondary | ICD-10-CM | POA: Diagnosis not present

## 2022-01-31 DIAGNOSIS — M5432 Sciatica, left side: Secondary | ICD-10-CM | POA: Diagnosis not present

## 2022-01-31 DIAGNOSIS — Z981 Arthrodesis status: Secondary | ICD-10-CM | POA: Diagnosis not present

## 2022-01-31 DIAGNOSIS — M4325 Fusion of spine, thoracolumbar region: Secondary | ICD-10-CM | POA: Diagnosis not present

## 2022-01-31 DIAGNOSIS — M48062 Spinal stenosis, lumbar region with neurogenic claudication: Secondary | ICD-10-CM | POA: Diagnosis present

## 2022-01-31 DIAGNOSIS — M5115 Intervertebral disc disorders with radiculopathy, thoracolumbar region: Secondary | ICD-10-CM | POA: Diagnosis not present

## 2022-01-31 DIAGNOSIS — M4805 Spinal stenosis, thoracolumbar region: Secondary | ICD-10-CM | POA: Diagnosis present

## 2022-01-31 DIAGNOSIS — N400 Enlarged prostate without lower urinary tract symptoms: Secondary | ICD-10-CM | POA: Diagnosis present

## 2022-01-31 DIAGNOSIS — M5431 Sciatica, right side: Secondary | ICD-10-CM | POA: Diagnosis not present

## 2022-01-31 DIAGNOSIS — Z86711 Personal history of pulmonary embolism: Secondary | ICD-10-CM | POA: Diagnosis not present

## 2022-01-31 DIAGNOSIS — M4314 Spondylolisthesis, thoracic region: Secondary | ICD-10-CM | POA: Diagnosis not present

## 2022-02-01 DIAGNOSIS — M5115 Intervertebral disc disorders with radiculopathy, thoracolumbar region: Secondary | ICD-10-CM | POA: Diagnosis not present

## 2022-02-01 DIAGNOSIS — Z981 Arthrodesis status: Secondary | ICD-10-CM | POA: Diagnosis not present

## 2022-02-01 DIAGNOSIS — M4804 Spinal stenosis, thoracic region: Secondary | ICD-10-CM | POA: Diagnosis present

## 2022-02-01 DIAGNOSIS — M4314 Spondylolisthesis, thoracic region: Secondary | ICD-10-CM | POA: Diagnosis not present

## 2022-02-01 DIAGNOSIS — I2510 Atherosclerotic heart disease of native coronary artery without angina pectoris: Secondary | ICD-10-CM | POA: Diagnosis present

## 2022-02-01 DIAGNOSIS — N400 Enlarged prostate without lower urinary tract symptoms: Secondary | ICD-10-CM | POA: Diagnosis present

## 2022-02-01 DIAGNOSIS — M4325 Fusion of spine, thoracolumbar region: Secondary | ICD-10-CM | POA: Diagnosis not present

## 2022-02-01 DIAGNOSIS — M963 Postlaminectomy kyphosis: Secondary | ICD-10-CM | POA: Diagnosis not present

## 2022-02-01 DIAGNOSIS — M40204 Unspecified kyphosis, thoracic region: Secondary | ICD-10-CM | POA: Diagnosis present

## 2022-02-01 DIAGNOSIS — M4805 Spinal stenosis, thoracolumbar region: Secondary | ICD-10-CM | POA: Diagnosis present

## 2022-02-01 DIAGNOSIS — M5432 Sciatica, left side: Secondary | ICD-10-CM | POA: Diagnosis not present

## 2022-02-01 DIAGNOSIS — Z86711 Personal history of pulmonary embolism: Secondary | ICD-10-CM | POA: Diagnosis not present

## 2022-02-01 DIAGNOSIS — M4714 Other spondylosis with myelopathy, thoracic region: Secondary | ICD-10-CM | POA: Diagnosis present

## 2022-02-01 DIAGNOSIS — M5431 Sciatica, right side: Secondary | ICD-10-CM | POA: Diagnosis not present

## 2022-02-01 DIAGNOSIS — I480 Paroxysmal atrial fibrillation: Secondary | ICD-10-CM | POA: Diagnosis present

## 2022-02-01 DIAGNOSIS — M47817 Spondylosis without myelopathy or radiculopathy, lumbosacral region: Secondary | ICD-10-CM | POA: Diagnosis present

## 2022-02-01 DIAGNOSIS — M4724 Other spondylosis with radiculopathy, thoracic region: Secondary | ICD-10-CM | POA: Diagnosis present

## 2022-02-01 DIAGNOSIS — Z87891 Personal history of nicotine dependence: Secondary | ICD-10-CM | POA: Diagnosis not present

## 2022-02-01 DIAGNOSIS — M48062 Spinal stenosis, lumbar region with neurogenic claudication: Secondary | ICD-10-CM | POA: Diagnosis present

## 2022-02-04 DIAGNOSIS — Z7984 Long term (current) use of oral hypoglycemic drugs: Secondary | ICD-10-CM | POA: Diagnosis not present

## 2022-02-04 DIAGNOSIS — Z87891 Personal history of nicotine dependence: Secondary | ICD-10-CM | POA: Diagnosis not present

## 2022-02-04 DIAGNOSIS — I712 Thoracic aortic aneurysm, without rupture, unspecified: Secondary | ICD-10-CM | POA: Diagnosis not present

## 2022-02-04 DIAGNOSIS — I2510 Atherosclerotic heart disease of native coronary artery without angina pectoris: Secondary | ICD-10-CM | POA: Diagnosis not present

## 2022-02-04 DIAGNOSIS — Z6841 Body Mass Index (BMI) 40.0 and over, adult: Secondary | ICD-10-CM | POA: Diagnosis not present

## 2022-02-04 DIAGNOSIS — I480 Paroxysmal atrial fibrillation: Secondary | ICD-10-CM | POA: Diagnosis not present

## 2022-02-04 DIAGNOSIS — M963 Postlaminectomy kyphosis: Secondary | ICD-10-CM | POA: Diagnosis not present

## 2022-02-04 DIAGNOSIS — E669 Obesity, unspecified: Secondary | ICD-10-CM | POA: Diagnosis not present

## 2022-02-04 DIAGNOSIS — M4724 Other spondylosis with radiculopathy, thoracic region: Secondary | ICD-10-CM | POA: Diagnosis not present

## 2022-02-04 DIAGNOSIS — E119 Type 2 diabetes mellitus without complications: Secondary | ICD-10-CM | POA: Diagnosis not present

## 2022-02-04 DIAGNOSIS — Z9181 History of falling: Secondary | ICD-10-CM | POA: Diagnosis not present

## 2022-02-04 DIAGNOSIS — M4727 Other spondylosis with radiculopathy, lumbosacral region: Secondary | ICD-10-CM | POA: Diagnosis not present

## 2022-02-04 DIAGNOSIS — Z7951 Long term (current) use of inhaled steroids: Secondary | ICD-10-CM | POA: Diagnosis not present

## 2022-02-04 DIAGNOSIS — Z7901 Long term (current) use of anticoagulants: Secondary | ICD-10-CM | POA: Diagnosis not present

## 2022-02-04 DIAGNOSIS — G4733 Obstructive sleep apnea (adult) (pediatric): Secondary | ICD-10-CM | POA: Diagnosis not present

## 2022-02-04 DIAGNOSIS — J4520 Mild intermittent asthma, uncomplicated: Secondary | ICD-10-CM | POA: Diagnosis not present

## 2022-02-04 DIAGNOSIS — I10 Essential (primary) hypertension: Secondary | ICD-10-CM | POA: Diagnosis not present

## 2022-02-04 DIAGNOSIS — Z86711 Personal history of pulmonary embolism: Secondary | ICD-10-CM | POA: Diagnosis not present

## 2022-02-04 DIAGNOSIS — M4316 Spondylolisthesis, lumbar region: Secondary | ICD-10-CM | POA: Diagnosis not present

## 2022-02-04 DIAGNOSIS — M4714 Other spondylosis with myelopathy, thoracic region: Secondary | ICD-10-CM | POA: Diagnosis not present

## 2022-02-04 DIAGNOSIS — N138 Other obstructive and reflux uropathy: Secondary | ICD-10-CM | POA: Diagnosis not present

## 2022-02-04 DIAGNOSIS — Z4789 Encounter for other orthopedic aftercare: Secondary | ICD-10-CM | POA: Diagnosis not present

## 2022-02-04 DIAGNOSIS — Z981 Arthrodesis status: Secondary | ICD-10-CM | POA: Diagnosis not present

## 2022-02-04 DIAGNOSIS — N401 Enlarged prostate with lower urinary tract symptoms: Secondary | ICD-10-CM | POA: Diagnosis not present

## 2022-02-04 DIAGNOSIS — Z79891 Long term (current) use of opiate analgesic: Secondary | ICD-10-CM | POA: Diagnosis not present

## 2022-02-07 DIAGNOSIS — Z6841 Body Mass Index (BMI) 40.0 and over, adult: Secondary | ICD-10-CM | POA: Diagnosis not present

## 2022-02-07 DIAGNOSIS — M4714 Other spondylosis with myelopathy, thoracic region: Secondary | ICD-10-CM | POA: Diagnosis not present

## 2022-02-07 DIAGNOSIS — E669 Obesity, unspecified: Secondary | ICD-10-CM | POA: Diagnosis not present

## 2022-02-07 DIAGNOSIS — Z4789 Encounter for other orthopedic aftercare: Secondary | ICD-10-CM | POA: Diagnosis not present

## 2022-02-07 DIAGNOSIS — I480 Paroxysmal atrial fibrillation: Secondary | ICD-10-CM | POA: Diagnosis not present

## 2022-02-07 DIAGNOSIS — E119 Type 2 diabetes mellitus without complications: Secondary | ICD-10-CM | POA: Diagnosis not present

## 2022-02-09 DIAGNOSIS — E119 Type 2 diabetes mellitus without complications: Secondary | ICD-10-CM | POA: Diagnosis not present

## 2022-02-09 DIAGNOSIS — Z981 Arthrodesis status: Secondary | ICD-10-CM | POA: Diagnosis not present

## 2022-02-09 DIAGNOSIS — Z4789 Encounter for other orthopedic aftercare: Secondary | ICD-10-CM | POA: Diagnosis not present

## 2022-02-09 DIAGNOSIS — M4714 Other spondylosis with myelopathy, thoracic region: Secondary | ICD-10-CM | POA: Diagnosis not present

## 2022-02-09 DIAGNOSIS — Z6841 Body Mass Index (BMI) 40.0 and over, adult: Secondary | ICD-10-CM | POA: Diagnosis not present

## 2022-02-09 DIAGNOSIS — I480 Paroxysmal atrial fibrillation: Secondary | ICD-10-CM | POA: Diagnosis not present

## 2022-02-09 DIAGNOSIS — E669 Obesity, unspecified: Secondary | ICD-10-CM | POA: Diagnosis not present

## 2022-02-09 DIAGNOSIS — M963 Postlaminectomy kyphosis: Secondary | ICD-10-CM | POA: Diagnosis not present

## 2022-02-09 DIAGNOSIS — M4316 Spondylolisthesis, lumbar region: Secondary | ICD-10-CM | POA: Diagnosis not present

## 2022-02-14 DIAGNOSIS — E119 Type 2 diabetes mellitus without complications: Secondary | ICD-10-CM | POA: Diagnosis not present

## 2022-02-14 DIAGNOSIS — Z4789 Encounter for other orthopedic aftercare: Secondary | ICD-10-CM | POA: Diagnosis not present

## 2022-02-14 DIAGNOSIS — Z6841 Body Mass Index (BMI) 40.0 and over, adult: Secondary | ICD-10-CM | POA: Diagnosis not present

## 2022-02-14 DIAGNOSIS — M4714 Other spondylosis with myelopathy, thoracic region: Secondary | ICD-10-CM | POA: Diagnosis not present

## 2022-02-14 DIAGNOSIS — I480 Paroxysmal atrial fibrillation: Secondary | ICD-10-CM | POA: Diagnosis not present

## 2022-02-14 DIAGNOSIS — E669 Obesity, unspecified: Secondary | ICD-10-CM | POA: Diagnosis not present

## 2022-02-16 DIAGNOSIS — E119 Type 2 diabetes mellitus without complications: Secondary | ICD-10-CM | POA: Diagnosis not present

## 2022-02-16 DIAGNOSIS — M4714 Other spondylosis with myelopathy, thoracic region: Secondary | ICD-10-CM | POA: Diagnosis not present

## 2022-02-16 DIAGNOSIS — E669 Obesity, unspecified: Secondary | ICD-10-CM | POA: Diagnosis not present

## 2022-02-16 DIAGNOSIS — Z6841 Body Mass Index (BMI) 40.0 and over, adult: Secondary | ICD-10-CM | POA: Diagnosis not present

## 2022-02-16 DIAGNOSIS — I480 Paroxysmal atrial fibrillation: Secondary | ICD-10-CM | POA: Diagnosis not present

## 2022-02-16 DIAGNOSIS — Z4789 Encounter for other orthopedic aftercare: Secondary | ICD-10-CM | POA: Diagnosis not present

## 2022-02-18 DIAGNOSIS — J018 Other acute sinusitis: Secondary | ICD-10-CM | POA: Diagnosis not present

## 2022-02-21 ENCOUNTER — Encounter (HOSPITAL_BASED_OUTPATIENT_CLINIC_OR_DEPARTMENT_OTHER): Payer: Self-pay

## 2022-02-21 ENCOUNTER — Emergency Department (HOSPITAL_BASED_OUTPATIENT_CLINIC_OR_DEPARTMENT_OTHER): Payer: Medicare Other | Admitting: Radiology

## 2022-02-21 ENCOUNTER — Emergency Department (HOSPITAL_BASED_OUTPATIENT_CLINIC_OR_DEPARTMENT_OTHER)
Admission: EM | Admit: 2022-02-21 | Discharge: 2022-02-21 | Disposition: A | Discharge status: Home / Self Care | Payer: Medicare Other | Attending: Emergency Medicine | Admitting: Emergency Medicine

## 2022-02-21 ENCOUNTER — Other Ambulatory Visit: Payer: Self-pay

## 2022-02-21 ENCOUNTER — Emergency Department (HOSPITAL_BASED_OUTPATIENT_CLINIC_OR_DEPARTMENT_OTHER): Payer: Medicare Other

## 2022-02-21 DIAGNOSIS — Z4789 Encounter for other orthopedic aftercare: Secondary | ICD-10-CM | POA: Diagnosis not present

## 2022-02-21 DIAGNOSIS — Z7901 Long term (current) use of anticoagulants: Secondary | ICD-10-CM | POA: Diagnosis not present

## 2022-02-21 DIAGNOSIS — Z91040 Latex allergy status: Secondary | ICD-10-CM | POA: Diagnosis not present

## 2022-02-21 DIAGNOSIS — M79662 Pain in left lower leg: Secondary | ICD-10-CM | POA: Diagnosis not present

## 2022-02-21 DIAGNOSIS — R600 Localized edema: Secondary | ICD-10-CM | POA: Diagnosis not present

## 2022-02-21 DIAGNOSIS — L0390 Cellulitis, unspecified: Secondary | ICD-10-CM

## 2022-02-21 DIAGNOSIS — M79661 Pain in right lower leg: Secondary | ICD-10-CM | POA: Diagnosis not present

## 2022-02-21 DIAGNOSIS — I2510 Atherosclerotic heart disease of native coronary artery without angina pectoris: Secondary | ICD-10-CM | POA: Insufficient documentation

## 2022-02-21 DIAGNOSIS — I509 Heart failure, unspecified: Secondary | ICD-10-CM | POA: Diagnosis not present

## 2022-02-21 DIAGNOSIS — Z79899 Other long term (current) drug therapy: Secondary | ICD-10-CM | POA: Diagnosis not present

## 2022-02-21 DIAGNOSIS — I480 Paroxysmal atrial fibrillation: Secondary | ICD-10-CM | POA: Diagnosis not present

## 2022-02-21 DIAGNOSIS — M4714 Other spondylosis with myelopathy, thoracic region: Secondary | ICD-10-CM | POA: Diagnosis not present

## 2022-02-21 DIAGNOSIS — N189 Chronic kidney disease, unspecified: Secondary | ICD-10-CM | POA: Insufficient documentation

## 2022-02-21 DIAGNOSIS — E669 Obesity, unspecified: Secondary | ICD-10-CM | POA: Diagnosis not present

## 2022-02-21 DIAGNOSIS — M7989 Other specified soft tissue disorders: Secondary | ICD-10-CM | POA: Diagnosis present

## 2022-02-21 DIAGNOSIS — R0600 Dyspnea, unspecified: Secondary | ICD-10-CM | POA: Diagnosis not present

## 2022-02-21 DIAGNOSIS — L03116 Cellulitis of left lower limb: Secondary | ICD-10-CM | POA: Diagnosis not present

## 2022-02-21 DIAGNOSIS — L03115 Cellulitis of right lower limb: Secondary | ICD-10-CM | POA: Diagnosis not present

## 2022-02-21 DIAGNOSIS — E119 Type 2 diabetes mellitus without complications: Secondary | ICD-10-CM | POA: Diagnosis not present

## 2022-02-21 DIAGNOSIS — Z6841 Body Mass Index (BMI) 40.0 and over, adult: Secondary | ICD-10-CM | POA: Diagnosis not present

## 2022-02-21 LAB — CBC WITH DIFFERENTIAL/PLATELET
Abs Immature Granulocytes: 0.02 10*3/uL (ref 0.00–0.07)
Basophils Absolute: 0 10*3/uL (ref 0.0–0.1)
Basophils Relative: 1 %
Eosinophils Absolute: 0.1 10*3/uL (ref 0.0–0.5)
Eosinophils Relative: 2 %
HCT: 33.4 % — ABNORMAL LOW (ref 39.0–52.0)
Hemoglobin: 10.4 g/dL — ABNORMAL LOW (ref 13.0–17.0)
Immature Granulocytes: 0 %
Lymphocytes Relative: 27 %
Lymphs Abs: 1.8 10*3/uL (ref 0.7–4.0)
MCH: 30.1 pg (ref 26.0–34.0)
MCHC: 31.1 g/dL (ref 30.0–36.0)
MCV: 96.8 fL (ref 80.0–100.0)
Monocytes Absolute: 0.6 10*3/uL (ref 0.1–1.0)
Monocytes Relative: 10 %
Neutro Abs: 4 10*3/uL (ref 1.7–7.7)
Neutrophils Relative %: 60 %
Platelets: 217 10*3/uL (ref 150–400)
RBC: 3.45 MIL/uL — ABNORMAL LOW (ref 4.22–5.81)
RDW: 16.1 % — ABNORMAL HIGH (ref 11.5–15.5)
WBC: 6.6 10*3/uL (ref 4.0–10.5)
nRBC: 0.5 % — ABNORMAL HIGH (ref 0.0–0.2)

## 2022-02-21 LAB — BASIC METABOLIC PANEL
Anion gap: 7 (ref 5–15)
BUN: 21 mg/dL (ref 8–23)
CO2: 28 mmol/L (ref 22–32)
Calcium: 8.9 mg/dL (ref 8.9–10.3)
Chloride: 102 mmol/L (ref 98–111)
Creatinine, Ser: 1.04 mg/dL (ref 0.61–1.24)
GFR, Estimated: >60 mL/min (ref >60)
Glucose, Bld: 154 mg/dL — ABNORMAL HIGH (ref 70–99)
Potassium: 4 mmol/L (ref 3.5–5.1)
Sodium: 137 mmol/L (ref 135–145)

## 2022-02-21 LAB — BRAIN NATRIURETIC PEPTIDE: B Natriuretic Peptide: 121.5 pg/mL — ABNORMAL HIGH (ref 0.0–100.0)

## 2022-02-21 MED ORDER — CEPHALEXIN 500 MG PO CAPS: 500.0000 mg | ORAL_CAPSULE | Freq: Two times a day (BID) | ORAL | 0 refills | Status: AC

## 2022-02-21 NOTE — ED Provider Notes (Signed)
Clinical Course as of 02/21/22 1543  Tue Feb 21, 2022  1540 Stable 71 YOM with a chief complaint of thrombophilia on xarelto here with BLLE swelling. Eval for DVT vs CHF exacerbation [CC]    Clinical Course User Index [CC] Tretha Sciara, MD   I reevaluated personally at bedside.  Patient is continuing to have bilateral lower extremity swelling.  However ultrasound without evidence of DVT. Progressive lower extremity edema despite compliance on his 80 mg Lasix.  Lab work without evidence of AKI or other acute emergency.  Had prolonged conversation with patient.  He likely has a developing CHF exacerbation.  Fortunately no respiratory compromise at this time.  We discussed supportive care including elevating his legs,'s Ace wraps to promote central venous return. Additionally, patient is to call his cardiologist in a.m. for further care and management. In the interim, will elevate his Lasix to 120 mg a day for the next 3 days and attempt to improve his urine output.  He may require reassessment within the next 48 hours and possible admission if failing outpatient therapy.   Tretha Sciara, MD 02/21/22 [phone]

## 2022-02-21 NOTE — ED Triage Notes (Signed)
Patient here POV from Home.  Endorses Back Surgery approximately 20 Days. Since then the Patient has had Progressive Swelling to Bilateral lower Extremities.   Diuretics have been mildly effective. Mild pain. No Fevers. Does Take Xarelto.   NAD Noted during Triage. A&Ox4, GCS 15. Ambulatory with walker.

## 2022-02-21 NOTE — Discharge Instructions (Addendum)
Increase lasix to '120mg'$  for the next 3 days Weight every morning if up over 2lbs in one day, call or return Start keflex for cellulitis Call Cardioligist tomorrow for further recommendations/appointment.  Call PCP about compression stockings.

## 2022-02-21 NOTE — ED Provider Notes (Signed)
[facility] Provider Note   CSN: [id number] Arrival date & time: 02/21/22  1324     History  Chief Complaint  Patient presents with   Leg Swelling    Carlos Dawson is a 82 y.o. male PMH of acquired thrombophilia, long-term anticoagulation use on Xarelto, atrial fibrillation, history of pulmonary embolism and DVT, CKD, CAD, CHF (EF 50 to 55% G1DD mild pulmonary htn d/t obesity) recent revision surgery T10 to L5 laminectomy and fusion 1/10 here for bilateral leg swelling since surgery.  Has been taking Lasix 80 mg daily.  He says that he normally urinates a lot however this time after the surgery has had some issues fully urinating/emptying his bladder.  He says that sometimes when he is laying down flat he feels smothered feeling.  He has been using his CPAP at home.  Denies any chest pain.  At home has been using recliner to elevate his legs mostly because of his back post-op.  He denies any systemic symptoms of fevers or vomiting.  He did notice some redness in the front of his legs right after his surgery and the swelling just increased since the surgery.  He says that he has been adherent to his Xarelto but did have to hold his Xarelto 3 days before surgery in a day or 2 after his surgery.  No missed doses other than that.  Denies cough.  HPI     Home Medications Prior to Admission medications   Medication Sig Start Date End Date Taking? Authorizing Provider  acetaminophen (TYLENOL) 500 MG tablet Take 1,000 mg by mouth every 8 (eight) hours as needed for mild pain or headache.     [provider]  albuterol (VENTOLIN HFA) 108 (90 Base) MCG/ACT inhaler Inhale 2 puffs into the lungs every 4 (four) hours as needed for wheezing or shortness of breath. 08/13/20   Chesley Mires, MD  augmented betamethasone dipropionate (DIPROLENE-AF) 0.05 % cream Apply 1 application topically 2 (two) times daily as needed for itching. 01/31/17   [provider]  baclofen (LIORESAL) 10 MG tablet Take 10 mg by mouth 3 (three) times daily as needed. 02/03/22   [provider]  budesonide-formoterol (SYMBICORT) 160-4.5 MCG/ACT inhaler Inhale 2 puffs into the lungs 2 (two) times daily. As needed    [provider]  Cholecalciferol (VITAMIN D-3) 5000 units TABS Take 5,000 Units by mouth daily.    [provider]  Coenzyme Q10 100 MG capsule Take 100 mg by mouth daily.     [provider]  dextromethorphan-guaiFENesin (MUCINEX DM) 30-600 MG 12hr tablet Take 2 tablets by mouth as needed.    [provider]  diphenoxylate-atropine (LOMOTIL) 2.5-0.025 MG tablet Take 1 tablet by mouth 4 (four) times daily as needed. 03/02/21   [provider]  doxycycline (VIBRA-TABS) 100 MG tablet Take 100 mg by mouth 2 (two) times daily. 02/18/22   [provider]  fluticasone (FLONASE) 50 MCG/ACT nasal spray Place 1 spray into both nostrils as needed for allergies or rhinitis.    [provider]  furosemide (LASIX) 80 MG tablet Take 1 tablet (80 mg total) by mouth daily. 01/04/21 01/03/22  Sherran Needs, NP  HYDROcodone-acetaminophen (NORCO) 10-325 MG tablet Take 1 tablet by mouth every 4 (four) hours as needed. 02/03/22   [provider]  JARDIANCE 25 MG TABS tablet Take 1 tablet by mouth daily. 04/18/18   [provider]  losartan (COZAAR) 25 MG  tablet TAKE 1 TABLET BY MOUTH EVERY DAY 07/28/20   Martinique, [name], MD  methocarbamol (ROBAXIN) 500 MG tablet Take 1 tablet (500 mg total) by mouth every 8 (eight) hours as needed for muscle spasms. 10/20/16   Eustace Moore, MD  metoprolol tartrate (LOPRESSOR) 25 MG tablet Take 12.5 mg by mouth 2 (two) times daily.    [provider]  nitroGLYCERIN (NITROSTAT) 0.4 MG SL tablet Place 1 tablet (0.4 mg total) under the tongue every 5 (five) minutes as needed for chest pain. 09/08/21   [place], [name], MD  pantoprazole (PROTONIX) 40 MG  tablet TAKE ONE TABLET BY MOUTH BEFORE BREAKFAST (TAKE ON AN EMPTY STOMACH 30 MINUTES PRIOR TO A MEAL) 03/09/21   [provider]  Polyethyl Glycol-Propyl Glycol (SYSTANE OP) Place 1 drop into both eyes daily as needed (for dry eyes).     [provider]  potassium chloride SA (KLOR-CON M20) 20 MEQ tablet Taking two tablet by mouth in the am and 1 tablet in the evening 01/04/21   Sherran Needs, NP  pregabalin (LYRICA) 50 MG capsule Take 50 mg by mouth 2 (two) times daily. 11/08/21   [provider]  psyllium (METAMUCIL) 58.6 % packet Take 1 packet by mouth daily.    [provider]  rivaroxaban (XARELTO) 20 MG TABS tablet Take 1 tablet (20 mg total) by mouth daily with supper. 03/14/17   [place], [name], MD  sildenafil (VIAGRA) 100 MG tablet Take 1 tablet (100 mg total) by mouth as needed for erectile dysfunction. 07/13/20   [place], [name], MD  Skin Protectants, Misc. (EUCERIN) cream Apply 1 application topically as needed for dry skin.    [provider]  spironolactone (ALDACTONE) 25 MG tablet Take 25 mg by mouth daily.    [provider]  tamsulosin (FLOMAX) 0.4 MG CAPS Take 0.4 mg by mouth every evening.     [provider]  topiramate (TOPAMAX) 25 MG capsule Take 50 mg by mouth 2 (two) times daily.     [provider]  traMADol (ULTRAM) 50 MG tablet Take 100 mg by mouth every 6 (six) hours as needed. 04/13/21   [provider]      Allergies    Other, Lotensin [benazepril], Tofranil [imipramine], Ace inhibitors, Adhesive [tape], Amoxicillin-pot clavulanate, Codeine, Latex, Metformin, Morphine, Nifedipine, and Quinolones    Review of Systems   Review of Systems  Constitutional:  Negative for chills and fever.  HENT:  Negative for ear pain and sore throat.   Eyes:  Negative for pain and visual disturbance.  Respiratory:  Negative for cough and shortness of breath.   Cardiovascular:  Positive for leg swelling.  Negative for chest pain and palpitations.  Gastrointestinal:  Negative for abdominal pain and vomiting.  Genitourinary:  Negative for dysuria and hematuria.  Musculoskeletal:  Negative for arthralgias and back pain.  Skin:  Negative for color change and rash.  Neurological:  Negative for seizures and syncope.  All other systems reviewed and are negative.   Physical Exam Updated Vital Signs BP 120/69 (BP Location: Right Arm)   Pulse 63   Temp 97.9 F (36.6 C) (Oral)   Resp 18   Ht 5' 8"$  (1.727 m)   Wt 118.6 kg   SpO2 100%   BMI 39.76 kg/m  Physical Exam Vitals and nursing note reviewed.  Constitutional:      General: He is not in acute distress.    Appearance: He is well-developed.  HENT:     Head: Normocephalic and atraumatic.  Eyes:     Conjunctiva/sclera: Conjunctivae normal.  Cardiovascular:     Rate and Rhythm: Normal rate.     Heart sounds: No murmur heard. Pulmonary:     Effort: Pulmonary effort is normal. No respiratory distress.     Breath sounds: Normal breath sounds.  Abdominal:     Palpations: Abdomen is soft.     Tenderness: There is no abdominal tenderness.  Musculoskeletal:        General: Swelling present.     Cervical back: Neck supple.     Right lower leg: Edema present.     Left lower leg: Edema present.     Comments: 3+ pitting edema b/l ot mid thigh, mildly tender to palpation of calves b/l, ,erythema anterior shin-no ulcers  Skin:    General: Skin is warm and dry.     Capillary Refill: Capillary refill takes less than 2 seconds.  Neurological:     Mental Status: He is alert.  Psychiatric:        Mood and Affect: Mood normal.     ED Results / Procedures / Treatments   Labs (all labs ordered are listed, but only abnormal results are displayed) Labs Reviewed  BASIC METABOLIC PANEL - Abnormal; Notable for the following components:      Result Value   Glucose, Bld 154 (*)    All other components within normal limits  BRAIN NATRIURETIC  PEPTIDE - Abnormal; Notable for the following components:   B Natriuretic Peptide 121.5 (*)    All other components within normal limits  CBC WITH DIFFERENTIAL/PLATELET - Abnormal; Notable for the following components:   RBC 3.45 (*)    Hemoglobin 10.4 (*)    HCT 33.4 (*)    RDW 16.1 (*)    nRBC 0.5 (*)    All other components within normal limits    EKG None  Radiology DG Chest 2 View  Result Date: 02/21/2022 CLINICAL DATA:  Dyspnea EXAM: CHEST - 2 VIEW COMPARISON:  Chest x-ray 04/15/2020.  CT of the chest 11/30/2021 FINDINGS: Heart is mildly enlarged. There some strandy opacities in both lung bases. There is no pleural effusion or pneumothorax. There is some linear scarring in the right upper lobe. Thoracolumbar fusion hardware is present. IMPRESSION: 1. Strandy opacities in both lung bases, likely atelectasis or scarring. 2. Mild cardiomegaly. Electronically Signed   By: Ronney Asters M.D.   On: 02/21/2022 15:26    Procedures Procedures   Medications Ordered in ED Medications - No data to display  ED Course/ Medical Decision Making/ A&P Clinical Course as of 02/21/22 1551  Tue Feb 21, 2022  1540 Stable 54 YOM with a chief complaint of thrombophilia on xarelto here with BLLE swelling. Eval for DVT vs CHF exacerbation [CC]    Clinical Course User Index [CC] Tretha Sciara, MD                            Medical Decision Making  Medical Decision Making:   YUREM SMUTNY is a 82 y.o. male who presented to the ED today with bilateral leg swelling detailed above.    Additional history discussed with patient's family/caregivers.  External chart has been reviewed including thrombophilia, hx of DVT/PE, CHF. Patient's presentation is complicated by their history of recent surgery 1/10 off of xarelto prior to this.  Complete initial physical exam performed, notably the patient  was  2+ pitting edema .    Reviewed and confirmed nursing documentation for past medical history,  family history, social history.    Initial Assessment:   With the patient's presentation of b/l leg swelling, most likely diagnosis is CHF vs DVT. Other diagnoses were considered including (but not limited to) dependent swelling. These are considered less likely due to history of present illness and physical exam findings.    Initial Plan:   Screening labs including CBC and Metabolic panel to evaluate for infectious or metabolic etiology of disease.  BNP CXR to evaluate for structural/infectious intrathoracic pathology.  Doppler Objective evaluation as below reviewed   Initial Study Results:   Awaiting labs and CXR   Final Assessment and Plan:   82 yo here for b/l leg swelling. CHF vs DVT workup. Awaiting labs and imaging. Signed out to oncoming provider Dr. Oswald Hillock.   Clinical Impression:  1. Bilateral leg edema      Data Unavailable   Final Clinical Impression(s) / ED Diagnoses Final diagnoses:  Bilateral leg edema    Rx / DC Orders ED Discharge Orders     None         Gerrit Heck, MD 02/21/22 1551    Elnora Morrison, MD 03/03/22 2322

## 2022-02-21 NOTE — ED Notes (Signed)
Patient requested to stay in Bedside Chair at this Time due to his Surgery. Patient informed that if the Patient needed to be in [facility] for Examination then ED Staff could provide assistance but Patient wished to remain in chair unless necessary to be in stretcher. Call [facility] placed within reach.

## 2022-02-21 NOTE — ED Notes (Signed)
Pt given discharge instructions and reviewed prescriptions. Opportunities given for questions. Pt verbalizes understanding. Leanne Chang, RN

## 2022-02-22 ENCOUNTER — Telehealth: Payer: Self-pay | Admitting: Cardiology

## 2022-02-22 ENCOUNTER — Encounter: Payer: Self-pay | Admitting: Cardiology

## 2022-02-22 DIAGNOSIS — J4530 Mild persistent asthma, uncomplicated: Secondary | ICD-10-CM | POA: Diagnosis not present

## 2022-02-22 DIAGNOSIS — I480 Paroxysmal atrial fibrillation: Secondary | ICD-10-CM | POA: Diagnosis not present

## 2022-02-22 DIAGNOSIS — E0822 Diabetes mellitus due to underlying condition with diabetic chronic kidney disease: Secondary | ICD-10-CM | POA: Diagnosis not present

## 2022-02-22 DIAGNOSIS — I5030 Unspecified diastolic (congestive) heart failure: Secondary | ICD-10-CM | POA: Diagnosis not present

## 2022-02-22 DIAGNOSIS — E785 Hyperlipidemia, unspecified: Secondary | ICD-10-CM | POA: Diagnosis not present

## 2022-02-22 DIAGNOSIS — I10 Essential (primary) hypertension: Secondary | ICD-10-CM | POA: Diagnosis not present

## 2022-02-22 NOTE — Telephone Encounter (Signed)
Left voicemail to call the clinic.

## 2022-02-22 NOTE — Telephone Encounter (Signed)
Patient returning call in regards to mychart message.  

## 2022-02-22 NOTE — Telephone Encounter (Signed)
Sent mychart message back.   Thanks!   Will have scheduling team reach out to get scheduled for TOC visit.

## 2022-02-23 DIAGNOSIS — L03116 Cellulitis of left lower limb: Secondary | ICD-10-CM | POA: Diagnosis not present

## 2022-02-23 DIAGNOSIS — R600 Localized edema: Secondary | ICD-10-CM | POA: Diagnosis not present

## 2022-02-23 DIAGNOSIS — H9202 Otalgia, left ear: Secondary | ICD-10-CM | POA: Diagnosis not present

## 2022-02-23 DIAGNOSIS — L03115 Cellulitis of right lower limb: Secondary | ICD-10-CM | POA: Diagnosis not present

## 2022-02-23 DIAGNOSIS — I5030 Unspecified diastolic (congestive) heart failure: Secondary | ICD-10-CM | POA: Diagnosis not present

## 2022-02-23 DIAGNOSIS — I878 Other specified disorders of veins: Secondary | ICD-10-CM | POA: Diagnosis not present

## 2022-02-24 ENCOUNTER — Telehealth: Payer: Self-pay

## 2022-02-24 DIAGNOSIS — E669 Obesity, unspecified: Secondary | ICD-10-CM | POA: Diagnosis not present

## 2022-02-24 DIAGNOSIS — I480 Paroxysmal atrial fibrillation: Secondary | ICD-10-CM | POA: Diagnosis not present

## 2022-02-24 DIAGNOSIS — Z6841 Body Mass Index (BMI) 40.0 and over, adult: Secondary | ICD-10-CM | POA: Diagnosis not present

## 2022-02-24 DIAGNOSIS — E119 Type 2 diabetes mellitus without complications: Secondary | ICD-10-CM | POA: Diagnosis not present

## 2022-02-24 DIAGNOSIS — M4714 Other spondylosis with myelopathy, thoracic region: Secondary | ICD-10-CM | POA: Diagnosis not present

## 2022-02-24 DIAGNOSIS — Z4789 Encounter for other orthopedic aftercare: Secondary | ICD-10-CM | POA: Diagnosis not present

## 2022-02-24 MED ORDER — FUROSEMIDE 40 MG PO TABS: ORAL_TABLET | ORAL | 1 refills | Status: DC

## 2022-02-24 NOTE — Telephone Encounter (Signed)
Spoke with wife to schedule appointment for edema. Appointment with Arnold Long for 2/9.

## 2022-02-27 DIAGNOSIS — Z4789 Encounter for other orthopedic aftercare: Secondary | ICD-10-CM | POA: Diagnosis not present

## 2022-02-27 DIAGNOSIS — E669 Obesity, unspecified: Secondary | ICD-10-CM | POA: Diagnosis not present

## 2022-02-27 DIAGNOSIS — E119 Type 2 diabetes mellitus without complications: Secondary | ICD-10-CM | POA: Diagnosis not present

## 2022-02-27 DIAGNOSIS — Z6841 Body Mass Index (BMI) 40.0 and over, adult: Secondary | ICD-10-CM | POA: Diagnosis not present

## 2022-02-27 DIAGNOSIS — I480 Paroxysmal atrial fibrillation: Secondary | ICD-10-CM | POA: Diagnosis not present

## 2022-02-27 DIAGNOSIS — M4714 Other spondylosis with myelopathy, thoracic region: Secondary | ICD-10-CM | POA: Diagnosis not present

## 2022-03-01 ENCOUNTER — Emergency Department (HOSPITAL_BASED_OUTPATIENT_CLINIC_OR_DEPARTMENT_OTHER): Payer: Medicare Other

## 2022-03-01 ENCOUNTER — Encounter (HOSPITAL_BASED_OUTPATIENT_CLINIC_OR_DEPARTMENT_OTHER): Payer: Self-pay | Admitting: Pediatrics

## 2022-03-01 ENCOUNTER — Emergency Department (HOSPITAL_BASED_OUTPATIENT_CLINIC_OR_DEPARTMENT_OTHER)
Admission: EM | Admit: 2022-03-01 | Discharge: 2022-03-01 | Disposition: A | Discharge status: Home / Self Care | Payer: Medicare Other | Attending: Emergency Medicine | Admitting: Emergency Medicine

## 2022-03-01 ENCOUNTER — Other Ambulatory Visit: Payer: Self-pay

## 2022-03-01 DIAGNOSIS — M4802 Spinal stenosis, cervical region: Secondary | ICD-10-CM | POA: Diagnosis not present

## 2022-03-01 DIAGNOSIS — H938X2 Other specified disorders of left ear: Secondary | ICD-10-CM | POA: Diagnosis not present

## 2022-03-01 DIAGNOSIS — Z7901 Long term (current) use of anticoagulants: Secondary | ICD-10-CM | POA: Insufficient documentation

## 2022-03-01 DIAGNOSIS — M47812 Spondylosis without myelopathy or radiculopathy, cervical region: Secondary | ICD-10-CM | POA: Diagnosis not present

## 2022-03-01 DIAGNOSIS — J029 Acute pharyngitis, unspecified: Secondary | ICD-10-CM | POA: Diagnosis not present

## 2022-03-01 DIAGNOSIS — I6529 Occlusion and stenosis of unspecified carotid artery: Secondary | ICD-10-CM | POA: Diagnosis not present

## 2022-03-01 LAB — CBC WITH DIFFERENTIAL/PLATELET
Abs Immature Granulocytes: 0.02 10*3/uL (ref 0.00–0.07)
Basophils Absolute: 0 10*3/uL (ref 0.0–0.1)
Basophils Relative: 1 %
Eosinophils Absolute: 0.2 10*3/uL (ref 0.0–0.5)
Eosinophils Relative: 3 %
HCT: 33 % — ABNORMAL LOW (ref 39.0–52.0)
Hemoglobin: 10.3 g/dL — ABNORMAL LOW (ref 13.0–17.0)
Immature Granulocytes: 0 %
Lymphocytes Relative: 31 %
Lymphs Abs: 1.9 10*3/uL (ref 0.7–4.0)
MCH: 29.7 pg (ref 26.0–34.0)
MCHC: 31.2 g/dL (ref 30.0–36.0)
MCV: 95.1 fL (ref 80.0–100.0)
Monocytes Absolute: 0.6 10*3/uL (ref 0.1–1.0)
Monocytes Relative: 10 %
Neutro Abs: 3.5 10*3/uL (ref 1.7–7.7)
Neutrophils Relative %: 55 %
Platelets: 180 10*3/uL (ref 150–400)
RBC: 3.47 MIL/uL — ABNORMAL LOW (ref 4.22–5.81)
RDW: 15.9 % — ABNORMAL HIGH (ref 11.5–15.5)
WBC: 6.3 10*3/uL (ref 4.0–10.5)
nRBC: 0 % (ref 0.0–0.2)

## 2022-03-01 LAB — BASIC METABOLIC PANEL
Anion gap: 6 (ref 5–15)
BUN: 22 mg/dL (ref 8–23)
CO2: 27 mmol/L (ref 22–32)
Calcium: 8.4 mg/dL — ABNORMAL LOW (ref 8.9–10.3)
Chloride: 103 mmol/L (ref 98–111)
Creatinine, Ser: 1.05 mg/dL (ref 0.61–1.24)
GFR, Estimated: >60 mL/min (ref >60)
Glucose, Bld: 131 mg/dL — ABNORMAL HIGH (ref 70–99)
Potassium: 3.7 mmol/L (ref 3.5–5.1)
Sodium: 136 mmol/L (ref 135–145)

## 2022-03-01 MED ORDER — METHYLPREDNISOLONE SODIUM SUCC 125 MG IJ SOLR
125.0000 mg | Freq: Once | INTRAMUSCULAR | Status: AC
  Administered 2022-03-01: 125 mg via INTRAVENOUS
  Filled 2022-03-01: qty 2

## 2022-03-01 MED ORDER — IOHEXOL 300 MG/ML  SOLN
100.0000 mL | Freq: Once | INTRAMUSCULAR | Status: AC | PRN
  Administered 2022-03-01: 75 mL via INTRAVENOUS

## 2022-03-01 MED ORDER — KETOROLAC TROMETHAMINE 15 MG/ML IJ SOLN
15.0000 mg | Freq: Once | INTRAMUSCULAR | Status: AC
  Administered 2022-03-01: 15 mg via INTRAVENOUS
  Filled 2022-03-01: qty 1

## 2022-03-01 NOTE — ED Triage Notes (Signed)
C/O left ear pain started on 1/10; c/o sore throat that worsen after the procedure he had back on the 10th; reports its worst night time and painful to swallow.

## 2022-03-01 NOTE — ED Provider Notes (Signed)
[facility] Provider Note   CSN: [id number] Arrival date & time: 03/01/22  1856     History  Chief Complaint  Patient presents with   Sore Throat   Otalgia    Carlos Dawson is a 82 y.o. male presenting to the ED with complaint of sore throat and earache.  Patient reports that his symptoms began about a month ago after he was intubated for a procedure.  He says he woke up was having pain in his left ear.  Subsequently has developed a sore throat which has been persistent for about 4 weeks.  He says that it become increasingly difficult to swallow.  He really struggles with his larger pills.  He also has persistent aches in his left ear, worse with laying down.  He says he has been on a variety medications including Keflex, and a diuretic, and told that he has "fluid behind his left ear".  He has an appointment coming up with an ENT doctor in a week but has not seen a specialist since.  HPI     Home Medications Prior to Admission medications   Medication Sig Start Date End Date Taking? Authorizing Provider  acetaminophen (TYLENOL) 500 MG tablet Take 1,000 mg by mouth every 8 (eight) hours as needed for mild pain or headache.     [provider]  albuterol (VENTOLIN HFA) 108 (90 Base) MCG/ACT inhaler Inhale 2 puffs into the lungs every 4 (four) hours as needed for wheezing or shortness of breath. 08/13/20   Chesley Mires, MD  augmented betamethasone dipropionate (DIPROLENE-AF) 0.05 % cream Apply 1 application topically 2 (two) times daily as needed for itching. 01/31/17   [provider]  baclofen (LIORESAL) 10 MG tablet Take 10 mg by mouth 3 (three) times daily as needed. 02/03/22   [provider]  budesonide-formoterol (SYMBICORT) 160-4.5 MCG/ACT inhaler Inhale 2 puffs into the lungs 2 (two) times daily. As needed    [provider]  Cholecalciferol (VITAMIN D-3) 5000 units TABS Take 5,000 Units by mouth  daily.    [provider]  Coenzyme Q10 100 MG capsule Take 100 mg by mouth daily.     [provider]  dextromethorphan-guaiFENesin (MUCINEX DM) 30-600 MG 12hr tablet Take 2 tablets by mouth as needed.    [provider]  diphenoxylate-atropine (LOMOTIL) 2.5-0.025 MG tablet Take 1 tablet by mouth 4 (four) times daily as needed. 03/02/21   [provider]  doxycycline (VIBRA-TABS) 100 MG tablet Take 100 mg by mouth 2 (two) times daily. 02/18/22   [provider]  fluticasone (FLONASE) 50 MCG/ACT nasal spray Place 1 spray into both nostrils as needed for allergies or rhinitis.    [provider]  furosemide (LASIX) 40 MG tablet Take one tablet ('40mg'$ ) by mouth each day as needed for swelling or weight gain. 02/24/22   Martin[place], [name], MD  furosemide (LASIX) 80 MG tablet Take 1 tablet (80 mg total) by mouth daily. 01/04/21 01/03/22  Sherran Needs, NP  HYDROcodone-acetaminophen (NORCO) 10-325 MG tablet Take 1 tablet by mouth every 4 (four) hours as needed. 02/03/22   [provider]  JARDIANCE 25 MG TABS tablet Take 1 tablet by mouth daily. 04/18/18   [provider]  losartan (COZAAR) 25 MG tablet TAKE 1 TABLET BY MOUTH EVERY DAY 07/28/20   [place], [name], MD  methocarbamol (ROBAXIN) 500 MG tablet Take 1 tablet (500 mg total) by mouth every 8 (  eight) hours as needed for muscle spasms. 10/20/16   Eustace Moore, MD  metoprolol tartrate (LOPRESSOR) 25 MG tablet Take 12.5 mg by mouth 2 (two) times daily.    [provider]  nitroGLYCERIN (NITROSTAT) 0.4 MG SL tablet Place 1 tablet (0.4 mg total) under the tongue every 5 (five) minutes as needed for chest pain. 09/08/21   [place], [name], MD  pantoprazole (PROTONIX) 40 MG tablet TAKE ONE TABLET BY MOUTH BEFORE BREAKFAST (TAKE ON AN EMPTY STOMACH 30 MINUTES PRIOR TO A MEAL) 03/09/21   [provider]  Polyethyl Glycol-Propyl Glycol (SYSTANE OP) Place 1 drop into both eyes daily  as needed (for dry eyes).     [provider]  potassium chloride SA (KLOR-CON M20) 20 MEQ tablet Taking two tablet by mouth in the am and 1 tablet in the evening 01/04/21   Sherran Needs, NP  pregabalin (LYRICA) 50 MG capsule Take 50 mg by mouth 2 (two) times daily. 11/08/21   [provider]  psyllium (METAMUCIL) 58.6 % packet Take 1 packet by mouth daily.    [provider]  rivaroxaban (XARELTO) 20 MG TABS tablet Take 1 tablet (20 mg total) by mouth daily with supper. 03/14/17   [place], [name], MD  sildenafil (VIAGRA) 100 MG tablet Take 1 tablet (100 mg total) by mouth as needed for erectile dysfunction. 07/13/20   [place], [name], MD  Skin Protectants, Misc. (EUCERIN) cream Apply 1 application topically as needed for dry skin.    [provider]  spironolactone (ALDACTONE) 25 MG tablet Take 25 mg by mouth daily.    [provider]  tamsulosin (FLOMAX) 0.4 MG CAPS Take 0.4 mg by mouth every evening.     [provider]  topiramate (TOPAMAX) 25 MG capsule Take 50 mg by mouth 2 (two) times daily.     [provider]  traMADol (ULTRAM) 50 MG tablet Take 100 mg by mouth every 6 (six) hours as needed. 04/13/21   [provider]      Allergies    Other, Lotensin [benazepril], Tofranil [imipramine], Ace inhibitors, Adhesive [tape], Amoxicillin-pot clavulanate, Codeine, Latex, Metformin, Morphine, Nifedipine, and Quinolones    Review of Systems   Review of Systems  Physical Exam Updated Vital Signs BP 114/62   Pulse 61   Temp 98.1 F (36.7 C)   Resp 17   Ht '5\' 8"'$  (1.727 m)   Wt 118.6 kg   SpO2 100%   BMI 39.76 kg/m  Physical Exam Constitutional:      General: He is not in acute distress. HENT:     Head: Normocephalic and atraumatic.     Right Ear: Tympanic membrane and ear canal normal. No drainage. No middle ear effusion. Tympanic membrane is not erythematous.     Left Ear: Ear canal normal. No drainage. A  middle ear effusion is present. Tympanic membrane is not erythematous.     Mouth/Throat:     Tonsils: No tonsillar exudate or tonsillar abscesses.  Eyes:     Conjunctiva/sclera: Conjunctivae normal.     Pupils: Pupils are equal, round, and reactive to light.  Cardiovascular:     Rate and Rhythm: Normal rate and regular rhythm.  Pulmonary:     Effort: Pulmonary effort is normal. No respiratory distress.  Abdominal:     General: There is no distension.     Tenderness: There is no abdominal tenderness.  Skin:    General: Skin is warm and dry.  Neurological:     General: No focal deficit present.     Mental Status: He is alert. Mental status is at baseline.  Psychiatric:        Mood and Affect: Mood normal.        Behavior: Behavior normal.     ED Results / Procedures / Treatments   Labs (all labs ordered are listed, but only abnormal results are displayed) Labs Reviewed  BASIC METABOLIC PANEL - Abnormal; Notable for the following components:      Result Value   Glucose, Bld 131 (*)    Calcium 8.4 (*)    All other components within normal limits  CBC WITH DIFFERENTIAL/PLATELET - Abnormal; Notable for the following components:   RBC 3.47 (*)    Hemoglobin 10.3 (*)    HCT 33.0 (*)    RDW 15.9 (*)    All other components within normal limits    EKG None  Radiology CT Soft Tissue Neck W Contrast  Result Date: 03/01/2022 CLINICAL DATA:  Sore throat x4 weeks after intubation for lower spinal procedure. Also with middle ear and eustachian tube dysfunction. Evaluation for obstructing mass or abscess. EXAM: CT NECK WITH CONTRAST TECHNIQUE: Multidetector CT imaging of the neck was performed using the standard protocol following the bolus administration of intravenous contrast. RADIATION DOSE REDUCTION: This exam was performed according to the departmental dose-optimization program which includes automated exposure control, adjustment of the mA and/or kV according to patient size  and/or use of iterative reconstruction technique. CONTRAST:  48m OMNIPAQUE IOHEXOL 300 MG/ML  SOLN COMPARISON:  None Available. FINDINGS: Pharynx and larynx: Normal. No mass or swelling. Salivary glands: No inflammation, mass, or stone. Thyroid: Normal. Lymph nodes: No suspicious cervical lymph nodes. Vascular: Atherosclerotic calcifications of the carotid bulbs. Limited intracranial: Unremarkable. Visualized orbits: Unremarkable. Mastoids and visualized paranasal sinuses: Well aerated. Skeleton: Large anterior osteophytes from C2-C5. Moderate degenerative changes of the C1-2 articulation with ossification of the annular ligament resulting in moderate spinal canal stenosis at this level. At least mild spinal canal stenosis at C5-6 and C6-7. Upper chest: Atherosclerotic calcifications of the aortic arch. Other: None. IMPRESSION: 1. No acute abnormality in the neck. No evidence of obstructing mass or fluid collection. 2. Large anterior osteophytes from C2-C5, which can contribute to dysphagia. 3. Moderate degenerative changes of the C1-2 articulation with ossification of the annular ligament resulting in moderate spinal canal stenosis at this level. 4. Aortic atherosclerosis (ICD10-I70.0). Electronically Signed   By: WEmmit AlexandersM.D.   On: 03/01/2022 11:44    Procedures Procedures    Medications Ordered in ED Medications  iohexol (OMNIPAQUE) 300 MG/ML solution 100 mL (75 mLs Intravenous Contrast Given 03/01/22 1052)  methylPREDNISolone sodium succinate (SOLU-MEDROL) 125 mg/2 mL injection 125 mg (125 mg Intravenous Given 03/01/22 1235)  ketorolac (TORADOL) 15 MG/ML injection 15 mg (15 mg Intravenous Given 03/01/22 1236)    ED Course/ Medical Decision Making/ A&P Clinical Course as of 03/01/22 1500  Wed Mar 01, 2022  1210 No emergent findings on CT imaging.  Patient will follow-up with ENT next week as scheduled.  Okay for discharge [MT]    Clinical Course User Index [MT] Agueda Houpt, MCarola Rhine MD                              Medical Decision Making Amount and/or Complexity of Data Reviewed Labs: ordered. Radiology: ordered.  Risk Prescription drug management.   Patient is here  with sore throat and earache.  Differential would include middle ear obstruction or eustachian tube dysfunction versus otitis media versus oropharyngeal abscess or obstructing lesion versus lymphadenopathy versus other  Given that his symptoms began after his intubation and he has had persistent sore throat for 4 weeks I think is reasonable to obtain CT imaging soft tissue of the neck to evaluate for potential abscess or deep space infection.  He does not appear toxic.  He has no trismus.  Does not appear septic.  No evidence of Ludwig's angina.  No evidence of strep pharyngitis on exam.  No sign of PTA.  His hearing is grossly intact.  He does have an effusion behind his left eardrum.            Final Clinical Impression(s) / ED Diagnoses Final diagnoses:  Sore throat  Sensation of fullness in left ear    Rx / DC Orders ED Discharge Orders     None         Annie Saephan, Carola Rhine, MD 03/01/22 1500

## 2022-03-01 NOTE — Discharge Instructions (Signed)
Please follow-up with ENT doctor next week as scheduled.

## 2022-03-01 NOTE — Progress Notes (Signed)
Cardiology Clinic Note   Patient Name: Carlos Dawson Date of Encounter: 03/03/2022  Primary Care Provider:  Charlane Ferretti, MD Primary Cardiologist:  Peter Martinique, MD  Patient Profile    82 y.o. male with past medical history of CAD (s/p BMS to 2nd OM in 2012, low-risk NST in 12/2012), Cath in 2019 with nonobstructive disease, PAF (on Xarelto), is followed by the Afib clinic; CHADS VASC Score of 6 ,(HTN, DM, Vascular, Age, TE (2);  chronic diastolic CHF, prior PE, HTN, HLD, Type 2 DM, and known thoracic aortic aneurysm.   He was recommended for Watchman device and was referred to [facility], EP. The patient wished to delay Watchman device surgery until after back surgery, scheduled for January 2024.  Was seen in the ED on 02/21/2022 for LEE and treated with increased doses of furosemide to 120 mg for 3 days and then return to 80 mg dally.     Past Medical History    Past Medical History:  Diagnosis Date   Anticoagulant long-term use    Aortic root enlargement (HCC)    Ascending aortic aneurysm ([place])    recent scan in October 2012 showing no change; followed by Dr. Servando Snare   ASCVD (arteriosclerotic cardiovascular disease)    Prior BMS to the 2nd OM in September 2012; with repeat cath in October showing patency   CAD (coronary artery disease)    a. s/p BMS to 2nd OM in Sept 2012; b. LexiScan Myoview (12/2012):  Inf infarct; bowel and motion artifact make study difficult to interpret; no ischemia; not gated; Low Risk   CHF (congestive heart failure) (HCC)    no recent issues 10/13/14   Chronic back pain    "all over my back" (05/11/2017)   Colonic polyp    Contact lens/glasses fitting    Diastolic dysfunction    DVT (deep venous thrombosis) ([place])    ?LLE   Frequent headaches    "probably weekly" (05/11/2017)   Generalized headaches    neck stenosis   GERD (gastroesophageal reflux disease)    Hearing loss    Hearing loss    more so on left   Hemorrhoids    History of stomach  ulcers    Hypertension    IBS (irritable bowel syndrome)    LVH (left ventricular hypertrophy)    Mild intermittent asthma    OA (osteoarthritis)    "all over" (05/11/2017)   Obesities, morbid (HCC)    OSA (obstructive sleep apnea)    PSG 03/30/97 AHI 21, BPAP 13/9   OSA on CPAP    PAF (paroxysmal atrial fibrillation) ([place])    a. on Xarelto b. s/p DCCV in 08/2016; b. Tikosyn failed 04/16/17 with plans for Multaq and possible Afib ablation with Dr. Rayann Heman   Pneumonia    'several times" (05/11/2017)   Prostate CA [facility])    Oncologist  DR. Daralene Milch [facility] dx 09/24/14, undetermined tx   prostate; S/P "radiation and hormone injections"   Pulmonary embolism ([place]) 2008   "both lungs"   SOB (shortness of breath)    on excertion   Thoracic aortic aneurysm (HCC)    Aortic Size Index=     5.0    /Body surface area is 2.43 meters squared. = 2.05  < 2.75 cm/m2      4% risk per year 2.75 to 4.25          8% risk per year > 4.25 cm/m2    20% risk per year   Stable  aneurysmal dilation of the ascending aorta with maximum AP diameter of 4.8 cm. Stable area of narrowing of the proximal most portion of the descending aorta measuring 2 cm., previously identified as an area of coarctation. No evidence of aortic dissection.  Coronary artery disease.  Normal appearance of the lungs.   Electronically Signed   By: Fidela Salisbury M.D.   On: 10/01/2014 08:50     Type II diabetes mellitus ([place])    metphormin, average 154 dx 2017   Past Surgical History:  Procedure Laterality Date   ACHILLES TENDON REPAIR Bilateral    AORTIC ARCH ANGIOGRAPHY N/A 03/13/2017   Procedure: AORTIC ARCH ANGIOGRAPHY;  Surgeon: Martinique, [name], MD;  Location: [place] [facility];  Service: Cardiovascular;  Laterality: N/A;   APPENDECTOMY     ATRIAL FIBRILLATION ABLATION  05/11/2017   ATRIAL FIBRILLATION ABLATION N/A 05/11/2017   Procedure: ATRIAL FIBRILLATION ABLATION;  Surgeon: [name], MD;  Location: [place] [facility];  Service:  Cardiovascular;  Laterality: N/A;   BACK SURGERY     "I've had 7 back and 1 neck ORs" (05/11/2017)   BIOPSY  03/14/2018   Procedure: BIOPSY;  Surgeon: Ronnette Juniper, MD;  Location: [facility];  Service: Gastroenterology;;  EGD and Colon   CARDIAC CATHETERIZATION  2006   CARPAL TUNNEL RELEASE Bilateral    LEFT   CATARACT EXTRACTION W/ INTRAOCULAR LENS  IMPLANT, BILATERAL Bilateral    CERVICAL SPINE SURGERY  06/02/2010   lower back and neck   COLONOSCOPY N/A 03/14/2018   Procedure: COLONOSCOPY;  Surgeon: Ronnette Juniper, MD;  Location: [facility];  Service: Gastroenterology;  Laterality: N/A;   COLONOSCOPY WITH PROPOFOL N/A 12/29/2014   Procedure: COLONOSCOPY WITH PROPOFOL;  Surgeon: Garlan Fair, MD;  [facility]: [facility];  Service: Endoscopy;  Laterality: N/A;   CORONARY ANGIOPLASTY WITH STENT PLACEMENT  October 2012   CORONARY STENT PLACEMENT  Sept 2012   2nd OM with BMS   ESOPHAGOGASTRODUODENOSCOPY N/A 03/14/2018   Procedure: ESOPHAGOGASTRODUODENOSCOPY (EGD);  Surgeon: Ronnette Juniper, MD;  Location: [facility];  Service: Gastroenterology;  Laterality: N/A;   HEMORROIDECTOMY     LAMINECTOMY  05/30/2012   L 4 L5   LAMINECTOMY WITH POSTERIOR LATERAL ARTHRODESIS LEVEL 3 N/A 10/18/2016   Procedure: Posterior Lateral Fusion - Lumbar One-Four, segmental instrumentation Lumbar One-Five,  decompression,;  Surgeon: Eustace Moore, MD;  Location: [place];  Service: Neurosurgery;  Laterality: N/A;   LAPAROSCOPIC CHOLECYSTECTOMY     LAPAROSCOPIC GASTRIC BANDING     LEFT AND RIGHT HEART CATHETERIZATION WITH CORONARY ANGIOGRAM N/A 05/07/2014   Procedure: LEFT AND RIGHT HEART CATHETERIZATION WITH CORONARY ANGIOGRAM;  Surgeon: [name] Martinique, MD;  Location: [facility] CATH LAB;  Service: Cardiovascular;  Laterality: N/A;   LEFT HEART CATH AND CORONARY ANGIOGRAPHY N/A 03/13/2017   Procedure: LEFT HEART CATH AND CORONARY ANGIOGRAPHY;  Surgeon: Martinique, [name], MD;  Location: [place] [facility];  Service: Cardiovascular;   Laterality: N/A;   LUMBAR LAMINECTOMY/DECOMPRESSION MICRODISCECTOMY N/A 05/04/2016   Procedure: Laminectomy and Foraminotomy - Thoracic twelve-Lumbar one -Posterior Fusion Lumbar one-two;  Surgeon: Eustace Moore, MD;  Location: [place];  Service: Neurosurgery;  Laterality: N/A;   POLYPECTOMY  03/14/2018   Procedure: POLYPECTOMY;  Surgeon: Ronnette Juniper, MD;  [facility];  Service: Gastroenterology;;   POSTERIOR LUMBAR FUSION  10/18/2016   SHOULDER OPEN ROTATOR CUFF REPAIR Bilateral    TONSILLECTOMY AND ADENOIDECTOMY     TOTAL HIP ARTHROPLASTY Right 10/18/2018   Procedure: RIGHT TOTAL  HIP ARTHROPLASTY ANTERIOR APPROACH;  Surgeon: Mcarthur Rossetti, MD;  [facility]: [facility];  Service: Orthopedics;  Laterality: Right;   TRIGGER FINGER RELEASE     LEFT   UVULOPALATOPHARYNGOPLASTY     VASECTOMY      Allergies  Allergies  Allergen Reactions   Other     Other reaction(s): cough, Other Other reaction(s): cough  Other reaction(s): wt gain   Lotensin [Benazepril] Cough   Tofranil [Imipramine]     Weight gain   Ace Inhibitors Cough   Adhesive [Tape] Itching and Rash   Amoxicillin-Pot Clavulanate Other (See Comments)    Other reaction(s): GI Upset (intolerance)   Codeine Nausea Only    Other reaction(s): nausea Other reaction(s): nausea Other reaction(s): nausea    Latex Itching, Rash and Other (See Comments)    Bandaids   Metformin Diarrhea   Morphine Itching   Nifedipine Other (See Comments)    Other reaction(s): leg edema   Quinolones Other (See Comments)    Patient was warned about not using Cipro and similar antibiotics. Recent studies have raised concern that fluoroquinolone antibiotics could be associated with an increased risk of aortic aneurysm Fluoroquinolones have non-antimicrobial properties that might jeopardise the integrity of the extracellular matrix of the vascular wall In a  propensity score matched cohort study in [place], there was a 66% increased rate of  aortic aneurysm or dissection associated with oral fluoroquinolone use, compared wit    History of Present Illness    Carlos Dawson comes today with multiple complaints status post back surgery.  He continues to have chronic dependent lower extremity edema although he has lost approximately 8 pounds with increased dose of Lasix temporarily from 80 mg daily to 120 mg for 3 days, and then returning back to 80 mg daily.  He is also having a lot of pain in his throat and in his ears not allowing him to lay back flat as it worsens in this position.  He denies significant shortness of breath, but is finding it hard to be comfortable because of the back brace and being told to keep his legs elevated.  He is sleeping in a lift chair currently as this is the most comfortable for him, as he has had difficulties getting in and out of his bed.  He has a follow-up appointment with the ENT in 4 days for further evaluation of his throat and his ears.  He is tired easily with walking and has been to several appointments today and is ready for a nap.  Home Medications    Current Outpatient Medications  Medication Sig Dispense Refill   acetaminophen (TYLENOL) 500 MG tablet Take 1,000 mg by mouth every 8 (eight) hours as needed for mild pain or headache.      albuterol (VENTOLIN HFA) 108 (90 Base) MCG/ACT inhaler Inhale 2 puffs into the lungs every 4 (four) hours as needed for wheezing or shortness of breath. 18 g 1   augmented betamethasone dipropionate (DIPROLENE-AF) 0.05 % cream Apply 1 application topically 2 (two) times daily as needed for itching.  3   baclofen (LIORESAL) 10 MG tablet Take 10 mg by mouth 3 (three) times daily as needed.     budesonide-formoterol (SYMBICORT) 160-4.5 MCG/ACT inhaler Inhale 2 puffs into the lungs 2 (two) times daily. As needed     Cholecalciferol (VITAMIN D-3) 5000 units TABS Take 5,000 Units by mouth daily.     Coenzyme Q10 100 MG capsule Take 100 mg by mouth daily.  dextromethorphan-guaiFENesin (MUCINEX DM) 30-600 MG 12hr tablet Take 2 tablets by mouth as needed.     diphenoxylate-atropine (LOMOTIL) 2.5-0.025 MG tablet Take 1 tablet by mouth 4 (four) times daily as needed.     fluticasone (FLONASE) 50 MCG/ACT nasal spray Place 1 spray into both nostrils as needed for allergies or rhinitis.     furosemide (LASIX) 40 MG tablet Take one tablet (42m) by mouth each day as needed for swelling or weight gain. 15 tablet 1   HYDROcodone-acetaminophen (NORCO) 10-325 MG tablet Take 1 tablet by mouth every 4 (four) hours as needed.     JARDIANCE 25 MG TABS tablet Take 1 tablet by mouth daily.     losartan (COZAAR) 25 MG tablet TAKE 1 TABLET BY MOUTH EVERY DAY 90 tablet 3   methocarbamol (ROBAXIN) 500 MG tablet Take 1 tablet (500 mg total) by mouth every 8 (eight) hours as needed for muscle spasms. 60 tablet 1   metoprolol tartrate (LOPRESSOR) 25 MG tablet Take 12.5 mg by mouth 2 (two) times daily.     nitroGLYCERIN (NITROSTAT) 0.4 MG SL tablet Place 1 tablet (0.4 mg total) under the tongue every 5 (five) minutes as needed for chest pain. 25 tablet 5   pantoprazole (PROTONIX) 40 MG tablet TAKE ONE TABLET BY MOUTH BEFORE BREAKFAST (TAKE ON AN EMPTY STOMACH 30 MINUTES PRIOR TO A MEAL)     Polyethyl Glycol-Propyl Glycol (SYSTANE OP) Place 1 drop into both eyes daily as needed (for dry eyes).      potassium chloride SA (KLOR-CON M20) 20 MEQ tablet Taking two tablet by mouth in the am and 1 tablet in the evening     psyllium (METAMUCIL) 58.6 % packet Take 1 packet by mouth daily.     rivaroxaban (XARELTO) 20 MG TABS tablet Take 1 tablet (20 mg total) by mouth daily with supper. 30 tablet    sildenafil (VIAGRA) 100 MG tablet Take 1 tablet (100 mg total) by mouth as needed for erectile dysfunction. 10 tablet 6   Skin Protectants, Misc. (EUCERIN) cream Apply 1 application topically as needed for dry skin.     spironolactone (ALDACTONE) 25 MG tablet Take 25 mg by mouth daily.      tamsulosin (FLOMAX) 0.4 MG CAPS Take 0.4 mg by mouth every evening.      topiramate (TOPAMAX) 25 MG capsule Take 50 mg by mouth 2 (two) times daily.      traMADol (ULTRAM) 50 MG tablet Take 100 mg by mouth every 6 (six) hours as needed.     furosemide (LASIX) 80 MG tablet Take 1 tablet (80 mg total) by mouth daily.     No current facility-administered medications for this visit.     Family History    Family History  Problem Relation Age of Onset   Heart disease Mother    Diabetes Mother    Other Mother        stent placement   Emphysema Father [phone]  Heart attack Sister    He indicated that his mother is alive. He indicated that his father is deceased. He indicated that only one of his two sisters is alive. He indicated that his brother is deceased. He indicated that his maternal grandmother is deceased. He indicated that his maternal grandfather is deceased. He indicated that his paternal grandmother is deceased. He indicated that his paternal grandfather is deceased.  Social History    Social History   Socioeconomic History   Marital status: Married  Spouse name: Not on file   Number of children: 3   Years of education: Not on file   Highest education level: Not on file  Occupational History   Occupation: Retired from Scientist, clinical (histocompatibility and immunogenetics): RETIRED  Tobacco Use   Smoking status: Former    Packs/day: 1.50    Years: 30.00    Total pack years: 45.00    Types: Cigarettes    Start date: 1958    Quit date: 01/24/1992    Years since quitting: 30.1   Smokeless tobacco: Never  Vaping Use   Vaping Use: Never used  Substance and Sexual Activity   Alcohol use: No   Drug use: No   Sexual activity: Not Currently  Other Topics Concern   Not on file  Social History Narrative   Not on file   Social Determinants of Health   Financial Resource Strain: Not on file  Food Insecurity: Not on file  Transportation Needs: Not on file  Physical Activity: Not on file  Stress: Not on file   Social Connections: Not on file  Intimate Partner Violence: Not on file     Review of Systems    General:  No chills, fever, night sweats or weight changes.  Cardiovascular:  No chest pain, positive for mild dyspnea on exertion, positive for chronic dependent edema, orthopnea, palpitations, paroxysmal nocturnal dyspnea. Dermatological: No rash, lesions/masses Respiratory: No cough, dyspnea Urologic: No hematuria, dysuria Abdominal:   No nausea, vomiting, diarrhea, bright red blood per rectum, melena, or hematemesis Neurologic:  No visual changes, wkns, changes in mental status. All other systems reviewed and are otherwise negative except as noted above.     Physical Exam    VS:  BP 100/62   Pulse 88   Ht 5' 7"$  (1.702 m)   Wt 247 lb (112 kg)   SpO2 96%   BMI 38.69 kg/m  , BMI Body mass index is 38.69 kg/m.     GEN: Well nourished, well developed, in no acute distress. HEENT: normal. Neck: Supple, no JVD, carotid bruits, or masses. Cardiac: RRR, 2/6 systolic murmurs, heard best at the right sternal border, less than at the left sternal border, does not radiate into the apex, no rubs, or gallops. No clubbing, cyanosis, positive for 1-2+ dependent edema.  Radials/DP/PT 2+ unable to palpate during assessment due to body posture. Respiratory:  Respirations regular and unlabored, clear to auscultation bilaterally. GI: Soft, nontender, nondistended, BS + x 4. MS: no deformity or atrophy wearing back brace around torso. Skin: warm and dry, no rash. Neuro:  Strength and sensation are intact. Psych: Normal affect.  Accessory Clinical Findings    ECG personally reviewed by me today-not completed during office visit.  Lab Results  Component Value Date   WBC 6.3 03/01/2022   HGB 10.3 (L) 03/01/2022   HCT 33.0 (L) 03/01/2022   MCV 95.1 03/01/2022   PLT 180 03/01/2022   Lab Results  Component Value Date   CREATININE 1.05 03/01/2022   BUN 22 03/01/2022   NA 136 03/01/2022   K  3.7 03/01/2022   CL 103 03/01/2022   CO2 27 03/01/2022   Lab Results  Component Value Date   ALT 13 10/21/2018   AST 13 (L) 10/21/2018   ALKPHOS 57 10/21/2018   BILITOT 1.4 (H) 10/21/2018   Lab Results  Component Value Date   CHOL 118 01/07/2013   HDL 40 01/07/2013   LDLCALC 51 01/07/2013   TRIG 133 01/07/2013  CHOLHDL 3.0 01/07/2013    Lab Results  Component Value Date   HGBA1C 6.7 (H) 10/15/2018    Review of Prior Studies: Myoview 03/06/17: Study Highlights    The left ventricular ejection fraction is normal (55-65%). Nuclear stress EF: 61%. There is a small defect of mild severity present in the basal inferior, mid inferior and apex location. The defect is partially reversible. Overall image quality is poor due to soft tissue attenuation. Cannot rule out a small area of ischemia. There is evidence of transient ischemic dilatation with a TID of 1.27. This is an intermediate risk study. There was no ST segment deviation noted during stress     Echo 03/07/17: Study Conclusions   - Left ventricle: The cavity size was normal. Wall thickness was   increased in a pattern of mild LVH. Systolic function was normal.   The estimated ejection fraction was in the range of 55% to 60%.   Wall motion was normal; there were no regional wall motion   abnormalities. Left ventricular diastolic function parameters   were normal. - Aortic valve: There was very mild stenosis. Valve area (VTI):   1.81 cm^2. Valve area (Vmax): 1.94 cm^2. Valve area (Vmean): 1.85   cm^2. - Right atrium: The atrium was mildly dilated. - Atrial septum: No defect or patent foramen ovale was identified. - Pulmonary arteries: PA peak pressure: 54 mm Hg (S).   Cardiac cath 03/15/17: Conclusion      Previously placed Ost 2nd Mrg to 2nd Mrg stent (unknown type) is widely patent. There is no aortic valve regurgitation.   1. No significant obstructive CAD 2. Aneurysmal thoracic aorta.    Plan: continue  medical management . Consider pulmonary evaluation for symptoms of dyspnea.       Assessment & Plan   1.  Atrial fibrillation: Patient's heart rate is irregular but well-controlled.  He is a candidate for the Watchman device but is not admitted to EP at this time due to recent back surgery.  He remains on rivaroxaban 20 mg daily and metoprolol 12.5 mg twice daily.  Will revisit this when he is fully healed from his back surgery and sequela status post intubation.  2.  Aortic valve stenosis: Reviewed most recent echocardiogram which was completed in 2019, which revealed very mild stenosis with a valve area of 1.81 cm (VTI), valve area (V-max): 1.94 cm, valve area (V mean) 1.85 cm.  I am repeating his echocardiogram due t significant aortic murmur.  Would prefer not to go up on diuretics to not reduce preload.  Continue losartan for afterload reduction and heart rate control with metoprolol.  3.  Chronic diastolic heart failure: Having a good bit of dependent edema.  May be related to fairly restrictive back brace around his torso, dependent position of feet for long periods of time.  He is unable to wear compression hose as he is unable to pull them up due to his recent back surgery.  He is advised on low-sodium diet, and keeping his feet as elevated as comfortably possible.  Will not go up on diuretics at this time, he is slightly hypotensive and do not want to reduce preload with aortic valve stenosis.  4.  Thoracic aortic aneurysm: Diameter will be checked during echocardiogram. Current medicines are reviewed at length with the patient today.  I have spent 30 min's  dedicated to the care of this patient on the date of this encounter to include pre-visit review of records, assessment, management and diagnostic  testing,with shared decision making.  Most recent CT scan of the chest revealed a stable 4.9 cm ascending thoracic aortic aneurysm without complicating features.  Signed, Phill Myron.  West Pugh, ANP, AACC   03/03/2022 2:51 PM      Office [phone] Fax [phone]  Notice: This dictation was prepared with Dragon dictation along with smaller phrase technology. Any transcriptional errors that result from this process are unintentional and may not be corrected upon review.

## 2022-03-02 ENCOUNTER — Encounter (HOSPITAL_COMMUNITY): Payer: Self-pay | Admitting: *Deleted

## 2022-03-02 DIAGNOSIS — M963 Postlaminectomy kyphosis: Secondary | ICD-10-CM | POA: Diagnosis not present

## 2022-03-02 DIAGNOSIS — M4804 Spinal stenosis, thoracic region: Secondary | ICD-10-CM | POA: Diagnosis not present

## 2022-03-02 DIAGNOSIS — M4726 Other spondylosis with radiculopathy, lumbar region: Secondary | ICD-10-CM | POA: Diagnosis not present

## 2022-03-02 DIAGNOSIS — M4714 Other spondylosis with myelopathy, thoracic region: Secondary | ICD-10-CM | POA: Diagnosis not present

## 2022-03-02 DIAGNOSIS — Z981 Arthrodesis status: Secondary | ICD-10-CM | POA: Diagnosis not present

## 2022-03-03 ENCOUNTER — Ambulatory Visit: Payer: Medicare Other | Attending: Adult Health | Admitting: Adult Health

## 2022-03-03 ENCOUNTER — Encounter: Payer: Self-pay | Admitting: Adult Health

## 2022-03-03 VITALS — BP 100/62 | HR 88 | Ht 67.0 in | Wt 247.0 lb

## 2022-03-03 DIAGNOSIS — R1310 Dysphagia, unspecified: Secondary | ICD-10-CM | POA: Diagnosis not present

## 2022-03-03 DIAGNOSIS — G8929 Other chronic pain: Secondary | ICD-10-CM | POA: Diagnosis not present

## 2022-03-03 DIAGNOSIS — I350 Nonrheumatic aortic (valve) stenosis: Secondary | ICD-10-CM | POA: Diagnosis not present

## 2022-03-03 DIAGNOSIS — M549 Dorsalgia, unspecified: Secondary | ICD-10-CM | POA: Diagnosis not present

## 2022-03-03 DIAGNOSIS — Z8546 Personal history of malignant neoplasm of prostate: Secondary | ICD-10-CM | POA: Diagnosis not present

## 2022-03-03 DIAGNOSIS — Z87891 Personal history of nicotine dependence: Secondary | ICD-10-CM | POA: Diagnosis not present

## 2022-03-03 DIAGNOSIS — C61 Malignant neoplasm of prostate: Secondary | ICD-10-CM | POA: Diagnosis not present

## 2022-03-03 NOTE — Patient Instructions (Addendum)
Medication Instructions:  No Changes *If you need a refill on your cardiac medications before your next appointment, please call your pharmacy*   Lab Work: No Labs If you have labs (blood work) drawn today and your tests are completely normal, you will receive your results only by: [place] (if you have MyChart) OR A paper copy in the mail If you have any lab test that is abnormal or we need to change your treatment, we will call you to review the results.   Testing/Procedures: [address]. Your physician has requested that you have an echocardiogram. Echocardiography is a painless test that uses sound waves to create images of your heart. It provides your doctor with information about the size and shape of your heart and how well your heart's chambers and valves are working. This procedure takes approximately one hour. There are no restrictions for this procedure. Please do NOT wear cologne, perfume, aftershave, or lotions (deodorant is allowed). Please arrive 15 minutes prior to your appointment time.    Follow-Up: At [facility], you and your health needs are our priority.  As part of our continuing mission to provide you with exceptional heart care, we have created designated Provider Care Teams.  These Care Teams include your primary Cardiologist (physician) and Advanced Practice Providers (APPs -  Physician Assistants and Nurse Practitioners) who all work together to provide you with the care you need, when you need it.  We recommend signing up for the patient portal called "MyChart".  Sign up information is provided on this After Visit Summary.  MyChart is used to connect with patients for Virtual Visits (Telemedicine).  Patients are able to view lab/test results, encounter notes, upcoming appointments, etc.  Non-urgent messages can be sent to your provider as well.   To learn more about what you can do with MyChart, go to  [email or web address].    Your next appointment:   1 month(s)  Provider:   First Available APP

## 2022-03-06 DIAGNOSIS — I480 Paroxysmal atrial fibrillation: Secondary | ICD-10-CM | POA: Diagnosis not present

## 2022-03-06 DIAGNOSIS — M963 Postlaminectomy kyphosis: Secondary | ICD-10-CM | POA: Diagnosis not present

## 2022-03-06 DIAGNOSIS — I10 Essential (primary) hypertension: Secondary | ICD-10-CM | POA: Diagnosis not present

## 2022-03-06 DIAGNOSIS — Z981 Arthrodesis status: Secondary | ICD-10-CM | POA: Diagnosis not present

## 2022-03-06 DIAGNOSIS — Z6841 Body Mass Index (BMI) 40.0 and over, adult: Secondary | ICD-10-CM | POA: Diagnosis not present

## 2022-03-06 DIAGNOSIS — Z7984 Long term (current) use of oral hypoglycemic drugs: Secondary | ICD-10-CM | POA: Diagnosis not present

## 2022-03-06 DIAGNOSIS — N401 Enlarged prostate with lower urinary tract symptoms: Secondary | ICD-10-CM | POA: Diagnosis not present

## 2022-03-06 DIAGNOSIS — Z4789 Encounter for other orthopedic aftercare: Secondary | ICD-10-CM | POA: Diagnosis not present

## 2022-03-06 DIAGNOSIS — I2510 Atherosclerotic heart disease of native coronary artery without angina pectoris: Secondary | ICD-10-CM | POA: Diagnosis not present

## 2022-03-06 DIAGNOSIS — N138 Other obstructive and reflux uropathy: Secondary | ICD-10-CM | POA: Diagnosis not present

## 2022-03-06 DIAGNOSIS — Z7951 Long term (current) use of inhaled steroids: Secondary | ICD-10-CM | POA: Diagnosis not present

## 2022-03-06 DIAGNOSIS — Z7901 Long term (current) use of anticoagulants: Secondary | ICD-10-CM | POA: Diagnosis not present

## 2022-03-06 DIAGNOSIS — M4316 Spondylolisthesis, lumbar region: Secondary | ICD-10-CM | POA: Diagnosis not present

## 2022-03-06 DIAGNOSIS — Z9181 History of falling: Secondary | ICD-10-CM | POA: Diagnosis not present

## 2022-03-06 DIAGNOSIS — G4733 Obstructive sleep apnea (adult) (pediatric): Secondary | ICD-10-CM | POA: Diagnosis not present

## 2022-03-06 DIAGNOSIS — Z86711 Personal history of pulmonary embolism: Secondary | ICD-10-CM | POA: Diagnosis not present

## 2022-03-06 DIAGNOSIS — J4520 Mild intermittent asthma, uncomplicated: Secondary | ICD-10-CM | POA: Diagnosis not present

## 2022-03-06 DIAGNOSIS — M4714 Other spondylosis with myelopathy, thoracic region: Secondary | ICD-10-CM | POA: Diagnosis not present

## 2022-03-06 DIAGNOSIS — I712 Thoracic aortic aneurysm, without rupture, unspecified: Secondary | ICD-10-CM | POA: Diagnosis not present

## 2022-03-06 DIAGNOSIS — M4727 Other spondylosis with radiculopathy, lumbosacral region: Secondary | ICD-10-CM | POA: Diagnosis not present

## 2022-03-06 DIAGNOSIS — E119 Type 2 diabetes mellitus without complications: Secondary | ICD-10-CM | POA: Diagnosis not present

## 2022-03-06 DIAGNOSIS — E669 Obesity, unspecified: Secondary | ICD-10-CM | POA: Diagnosis not present

## 2022-03-06 DIAGNOSIS — Z87891 Personal history of nicotine dependence: Secondary | ICD-10-CM | POA: Diagnosis not present

## 2022-03-06 DIAGNOSIS — Z79891 Long term (current) use of opiate analgesic: Secondary | ICD-10-CM | POA: Diagnosis not present

## 2022-03-06 DIAGNOSIS — M4724 Other spondylosis with radiculopathy, thoracic region: Secondary | ICD-10-CM | POA: Diagnosis not present

## 2022-03-07 ENCOUNTER — Ambulatory Visit: Payer: Medicare Other | Admitting: Physician Assistant

## 2022-03-07 DIAGNOSIS — R1310 Dysphagia, unspecified: Secondary | ICD-10-CM | POA: Diagnosis not present

## 2022-03-07 DIAGNOSIS — E349 Endocrine disorder, unspecified: Secondary | ICD-10-CM | POA: Diagnosis not present

## 2022-03-07 DIAGNOSIS — N6489 Other specified disorders of breast: Secondary | ICD-10-CM | POA: Diagnosis not present

## 2022-03-07 DIAGNOSIS — C50922 Malignant neoplasm of unspecified site of left male breast: Secondary | ICD-10-CM | POA: Diagnosis not present

## 2022-03-07 DIAGNOSIS — R310 Gross hematuria: Secondary | ICD-10-CM | POA: Diagnosis not present

## 2022-03-07 DIAGNOSIS — Z9889 Other specified postprocedural states: Secondary | ICD-10-CM | POA: Diagnosis not present

## 2022-03-09 ENCOUNTER — Encounter (HOSPITAL_BASED_OUTPATIENT_CLINIC_OR_DEPARTMENT_OTHER): Payer: Self-pay | Admitting: Emergency Medicine

## 2022-03-09 ENCOUNTER — Other Ambulatory Visit: Payer: Self-pay

## 2022-03-09 ENCOUNTER — Emergency Department (HOSPITAL_BASED_OUTPATIENT_CLINIC_OR_DEPARTMENT_OTHER)
Admission: EM | Admit: 2022-03-09 | Discharge: 2022-03-09 | Disposition: A | Discharge status: Home / Self Care | Payer: Medicare Other | Attending: Emergency Medicine | Admitting: Emergency Medicine

## 2022-03-09 ENCOUNTER — Emergency Department (HOSPITAL_BASED_OUTPATIENT_CLINIC_OR_DEPARTMENT_OTHER): Payer: Medicare Other

## 2022-03-09 DIAGNOSIS — Z8546 Personal history of malignant neoplasm of prostate: Secondary | ICD-10-CM | POA: Diagnosis not present

## 2022-03-09 DIAGNOSIS — Z7901 Long term (current) use of anticoagulants: Secondary | ICD-10-CM | POA: Diagnosis not present

## 2022-03-09 DIAGNOSIS — I4891 Unspecified atrial fibrillation: Secondary | ICD-10-CM | POA: Insufficient documentation

## 2022-03-09 DIAGNOSIS — I110 Hypertensive heart disease with heart failure: Secondary | ICD-10-CM | POA: Diagnosis not present

## 2022-03-09 DIAGNOSIS — R109 Unspecified abdominal pain: Secondary | ICD-10-CM | POA: Insufficient documentation

## 2022-03-09 DIAGNOSIS — E119 Type 2 diabetes mellitus without complications: Secondary | ICD-10-CM | POA: Diagnosis not present

## 2022-03-09 DIAGNOSIS — I509 Heart failure, unspecified: Secondary | ICD-10-CM | POA: Insufficient documentation

## 2022-03-09 DIAGNOSIS — I2510 Atherosclerotic heart disease of native coronary artery without angina pectoris: Secondary | ICD-10-CM | POA: Diagnosis not present

## 2022-03-09 DIAGNOSIS — I700 Atherosclerosis of aorta: Secondary | ICD-10-CM | POA: Diagnosis not present

## 2022-03-09 LAB — CBC WITH DIFFERENTIAL/PLATELET
Abs Immature Granulocytes: 0.01 10*3/uL (ref 0.00–0.07)
Basophils Absolute: 0 10*3/uL (ref 0.0–0.1)
Basophils Relative: 1 %
Eosinophils Absolute: 0.1 10*3/uL (ref 0.0–0.5)
Eosinophils Relative: 2 %
HCT: 30.5 % — ABNORMAL LOW (ref 39.0–52.0)
Hemoglobin: 9.6 g/dL — ABNORMAL LOW (ref 13.0–17.0)
Immature Granulocytes: 0 %
Lymphocytes Relative: 33 %
Lymphs Abs: 2.1 10*3/uL (ref 0.7–4.0)
MCH: 29.3 pg (ref 26.0–34.0)
MCHC: 31.5 g/dL (ref 30.0–36.0)
MCV: 93 fL (ref 80.0–100.0)
Monocytes Absolute: 0.7 10*3/uL (ref 0.1–1.0)
Monocytes Relative: 11 %
Neutro Abs: 3.3 10*3/uL (ref 1.7–7.7)
Neutrophils Relative %: 53 %
Platelets: 170 10*3/uL (ref 150–400)
RBC: 3.28 MIL/uL — ABNORMAL LOW (ref 4.22–5.81)
RDW: 16.1 % — ABNORMAL HIGH (ref 11.5–15.5)
WBC: 6.2 10*3/uL (ref 4.0–10.5)
nRBC: 0 % (ref 0.0–0.2)

## 2022-03-09 LAB — URINALYSIS, ROUTINE W REFLEX MICROSCOPIC
Bilirubin Urine: NEGATIVE
Glucose, UA: >=500 mg/dL — AB
Ketones, ur: NEGATIVE mg/dL
Leukocytes,Ua: NEGATIVE
Nitrite: NEGATIVE
Protein, ur: NEGATIVE mg/dL
Specific Gravity, Urine: 1.015 (ref 1.005–1.030)
pH: 5.5 (ref 5.0–8.0)

## 2022-03-09 LAB — COMPREHENSIVE METABOLIC PANEL
ALT: 10 U/L (ref 0–44)
AST: 16 U/L (ref 15–41)
Albumin: 3.2 g/dL — ABNORMAL LOW (ref 3.5–5.0)
Alkaline Phosphatase: 107 U/L (ref 38–126)
Anion gap: 3 — ABNORMAL LOW (ref 5–15)
BUN: 21 mg/dL (ref 8–23)
CO2: 22 mmol/L (ref 22–32)
Calcium: 8.1 mg/dL — ABNORMAL LOW (ref 8.9–10.3)
Chloride: 110 mmol/L (ref 98–111)
Creatinine, Ser: 1.04 mg/dL (ref 0.61–1.24)
GFR, Estimated: >60 mL/min (ref >60)
Glucose, Bld: 139 mg/dL — ABNORMAL HIGH (ref 70–99)
Potassium: 3.9 mmol/L (ref 3.5–5.1)
Sodium: 135 mmol/L (ref 135–145)
Total Bilirubin: 0.8 mg/dL (ref 0.3–1.2)
Total Protein: 6 g/dL — ABNORMAL LOW (ref 6.5–8.1)

## 2022-03-09 LAB — URINALYSIS, MICROSCOPIC (REFLEX): Squamous Epithelial / HPF: NONE SEEN /HPF (ref 0–5)

## 2022-03-09 LAB — LIPASE, BLOOD: Lipase: 35 U/L (ref 11–51)

## 2022-03-09 MED ORDER — IOHEXOL 300 MG/ML  SOLN
125.0000 mL | Freq: Once | INTRAMUSCULAR | Status: AC | PRN
  Administered 2022-03-09: 125 mL via INTRAVENOUS

## 2022-03-09 NOTE — ED Triage Notes (Signed)
Patient c/o right flank pain for a while, reports worsening over the last 2-3 weeks. Patient had back surgery on 02/01/22, reports had flank pain before his surgery, feels like he is laying on a rock.

## 2022-03-09 NOTE — ED Provider Notes (Signed)
Emergency Department Provider Note   I have reviewed the triage vital signs and the nursing notes.   HISTORY  Chief Complaint Abdominal Pain   HPI SAYD BLANDIN is a 82 y.o. male past history reviewed below including chronic anticoagulation for A-fib/PE, CHF, HTN, prostate cancer, and s/p T10-L5 laminectomy and fusion with hardware revision (02/01/22 at [facility]) presents to the emergency permit for evaluation of right flank pain and hematuria.  He states he is actually had this pain which has been fairly constant since prior to surgery.  After his procedure in early January has been feeling well.  He has some baseline numbness in the right leg which is unchanged.  No fevers or chills.  The right flank pain, however, seems to be worsening and not improved with muscle relaxers.  He did pass some urinary clot several days ago and has not had retention symptoms but notices that the urine remains pink color. No CP or SOB. He remains compliant with anticoagulation.   Past Medical History:  Diagnosis Date   Anticoagulant Tascha Casares-term use    Aortic root enlargement (HCC)    Ascending aortic aneurysm ([place])    recent scan in October 2012 showing no change; followed by Dr. Servando Snare   ASCVD (arteriosclerotic cardiovascular disease)    Prior BMS to the 2nd OM in September 2012; with repeat cath in October showing patency   CAD (coronary artery disease)    a. s/p BMS to 2nd OM in Sept 2012; b. LexiScan Myoview (12/2012):  Inf infarct; bowel and motion artifact make study difficult to interpret; no ischemia; not gated; Low Risk   CHF (congestive heart failure) (HCC)    no recent issues 10/13/14   Chronic back pain    "all over my back" (05/11/2017)   Colonic polyp    Contact lens/glasses fitting    Diastolic dysfunction    DVT (deep venous thrombosis) ([place])    ?LLE   Frequent headaches    "probably weekly" (05/11/2017)   Generalized headaches    neck stenosis   GERD  (gastroesophageal reflux disease)    Hearing loss    Hearing loss    more so on left   Hemorrhoids    History of stomach ulcers    Hypertension    IBS (irritable bowel syndrome)    LVH (left ventricular hypertrophy)    Mild intermittent asthma    OA (osteoarthritis)    "all over" (05/11/2017)   Obesities, morbid (HCC)    OSA (obstructive sleep apnea)    PSG 03/30/97 AHI 21, BPAP 13/9   OSA on CPAP    PAF (paroxysmal atrial fibrillation) ([place])    a. on Xarelto b. s/p DCCV in 08/2016; b. Tikosyn failed 04/16/17 with plans for Multaq and possible Afib ablation with Dr. Rayann Heman   Pneumonia    'several times" (05/11/2017)   Prostate CA [facility])    Oncologist  DR. Daralene Milch [facility] dx 09/24/14, undetermined tx   prostate; S/P "radiation and hormone injections"   Pulmonary embolism ([place]) 2008   "both lungs"   SOB (shortness of breath)    on excertion   Thoracic aortic aneurysm (HCC)    Aortic Size Index=     5.0    /Body surface area is 2.43 meters squared. = 2.05  < 2.75 cm/m2      4% risk per year 2.75 to 4.25          8% risk per year > 4.25 cm/m2  20% risk per year   Stable aneurysmal dilation of the ascending aorta with maximum AP diameter of 4.8 cm. Stable area of narrowing of the proximal most portion of the descending aorta measuring 2 cm., previously identified as an area of coarctation. No evidence of aortic dissection.  Coronary artery disease.  Normal appearance of the lungs.   Electronically Signed   By: Fidela Salisbury M.D.   On: 10/01/2014 08:50     Type II diabetes mellitus (HCC)    metphormin, average 154 dx 2017    Review of Systems  Constitutional: No fever/chills Eyes: No visual changes. ENT: No sore throat. Cardiovascular: Denies chest pain. Respiratory: Denies shortness of breath. Gastrointestinal: Positive right flank/abdominal pain.  No nausea, no vomiting.  No diarrhea.  No constipation. Genitourinary: Negative for dysuria. Musculoskeletal: Negative for back  pain. Skin: Negative for rash. Neurological: Negative for headaches, focal weakness or numbness.  ____________________________________________   PHYSICAL EXAM:  VITAL SIGNS: ED Triage Vitals  Enc Vitals Group     BP 03/09/22 2103 (!) 141/84     Pulse Rate 03/09/22 2103 86     Resp 03/09/22 2103 (!) 24     Temp 03/09/22 2103 98.6 F (37 C)     Temp Source 03/09/22 2103 Oral     SpO2 03/09/22 2103 97 %     Weight 03/09/22 2102 245 lb (111.1 kg)     Height 03/09/22 2102 5' 7"$  (1.702 m)   Constitutional: Alert and oriented. Well appearing and in no acute distress. Eyes: Conjunctivae are normal.  Head: Atraumatic. Nose: No congestion/rhinnorhea. Mouth/Throat: Mucous membranes are moist.  Neck: No stridor.   Cardiovascular: Normal rate, regular rhythm. Good peripheral circulation. Grossly normal heart sounds.   Respiratory: Normal respiratory effort.  No retractions. Lungs CTAB. Gastrointestinal: Soft and nontender. No distention.  Musculoskeletal: No lower extremity tenderness nor edema. No gross deformities of extremities. Neurologic:  Normal speech and language. No gross focal neurologic deficits are appreciated.  Skin:  Skin is warm, dry and intact. No rash noted.  ____________________________________________   LABS (all labs ordered are listed, but only abnormal results are displayed)  Labs Reviewed  URINALYSIS, ROUTINE W REFLEX MICROSCOPIC - Abnormal; Notable for the following components:      Result Value   Glucose, UA >=500 (*)    Hgb urine dipstick SMALL (*)    All other components within normal limits  COMPREHENSIVE METABOLIC PANEL - Abnormal; Notable for the following components:   Glucose, Bld 139 (*)    Calcium 8.1 (*)    Total Protein 6.0 (*)    Albumin 3.2 (*)    Anion gap 3 (*)    All other components within normal limits  CBC WITH DIFFERENTIAL/PLATELET - Abnormal; Notable for the following components:   RBC 3.28 (*)    Hemoglobin 9.6 (*)    HCT 30.5  (*)    RDW 16.1 (*)    All other components within normal limits  URINALYSIS, MICROSCOPIC (REFLEX) - Abnormal; Notable for the following components:   Bacteria, UA RARE (*)    All other components within normal limits  LIPASE, BLOOD    ____________________________________________   PROCEDURES  Procedure(s) performed:   Procedures  None ____________________________________________   INITIAL IMPRESSION / ASSESSMENT AND PLAN / ED COURSE  Pertinent labs & imaging results that were available during my care of the patient were reviewed by me and considered in my medical decision making (see chart for details).   This patient is  Presenting for Evaluation of back pain, which does require a range of treatment options, and is a complaint that involves a high risk of morbidity and mortality.  The Differential Diagnoses includes but is not exclusive to acute cholecystitis, intrathoracic causes for epigastric abdominal pain, gastritis, duodenitis, pancreatitis, small bowel or large bowel obstruction, abdominal aortic aneurysm, hernia, gastritis, etc.   Critical Interventions-    Medications  iohexol (OMNIPAQUE) 300 MG/ML solution 125 mL (125 mLs Intravenous Contrast Given 03/09/22 2238)    Reassessment after intervention: pain improved.   Clinical Laboratory Tests Ordered, included lipase and LFTs normal. No UTI.   Radiologic Tests Ordered, included CT abdomen/pelvis. I independently interpreted the images and agree with radiology interpretation.   Cardiac Monitor Tracing which shows NSR.    Social Determinants of Health Risk patient is not an active smoker.   Medical Decision Making: Summary:  Patient presents to the ED with flank and abd pain. Pain was present prior to recent spine surgery. No neuro changes to strongly suspect post-op complication. Plan for CT imaging and reassess.   Reevaluation with update and discussion with patient and wife at bedside. Updated on CT  results. Plan for pain mgmt at home and close PCP and spine follow up.   Patient's presentation is most consistent with acute presentation with potential threat to life or bodily function.   Disposition: discharge  ____________________________________________  FINAL CLINICAL IMPRESSION(S) / ED DIAGNOSES  Final diagnoses:  Right flank pain    Note:  This document was prepared using Dragon voice recognition software and may include unintentional dictation errors.  Nanda Quinton, MD, [facility] Emergency Medicine    Cayle Cordoba, Wonda Olds, MD 03/14/22 [phone]

## 2022-03-09 NOTE — Discharge Instructions (Signed)

## 2022-03-10 DIAGNOSIS — K219 Gastro-esophageal reflux disease without esophagitis: Secondary | ICD-10-CM | POA: Diagnosis not present

## 2022-03-10 DIAGNOSIS — I480 Paroxysmal atrial fibrillation: Secondary | ICD-10-CM | POA: Diagnosis not present

## 2022-03-10 DIAGNOSIS — N400 Enlarged prostate without lower urinary tract symptoms: Secondary | ICD-10-CM | POA: Diagnosis not present

## 2022-03-10 DIAGNOSIS — N183 Chronic kidney disease, stage 3 unspecified: Secondary | ICD-10-CM | POA: Diagnosis not present

## 2022-03-10 DIAGNOSIS — I5030 Unspecified diastolic (congestive) heart failure: Secondary | ICD-10-CM | POA: Diagnosis not present

## 2022-03-10 DIAGNOSIS — E785 Hyperlipidemia, unspecified: Secondary | ICD-10-CM | POA: Diagnosis not present

## 2022-03-10 DIAGNOSIS — E1169 Type 2 diabetes mellitus with other specified complication: Secondary | ICD-10-CM | POA: Diagnosis not present

## 2022-03-10 DIAGNOSIS — I10 Essential (primary) hypertension: Secondary | ICD-10-CM | POA: Diagnosis not present

## 2022-03-13 DIAGNOSIS — C61 Malignant neoplasm of prostate: Secondary | ICD-10-CM | POA: Diagnosis not present

## 2022-03-13 DIAGNOSIS — R972 Elevated prostate specific antigen [PSA]: Secondary | ICD-10-CM | POA: Diagnosis not present

## 2022-03-13 DIAGNOSIS — R109 Unspecified abdominal pain: Secondary | ICD-10-CM | POA: Diagnosis not present

## 2022-03-13 DIAGNOSIS — N2889 Other specified disorders of kidney and ureter: Secondary | ICD-10-CM | POA: Diagnosis not present

## 2022-03-13 DIAGNOSIS — R110 Nausea: Secondary | ICD-10-CM | POA: Diagnosis not present

## 2022-03-13 DIAGNOSIS — R300 Dysuria: Secondary | ICD-10-CM | POA: Diagnosis not present

## 2022-03-14 DIAGNOSIS — R109 Unspecified abdominal pain: Secondary | ICD-10-CM | POA: Diagnosis not present

## 2022-03-14 DIAGNOSIS — R300 Dysuria: Secondary | ICD-10-CM | POA: Diagnosis not present

## 2022-03-17 DIAGNOSIS — M4714 Other spondylosis with myelopathy, thoracic region: Secondary | ICD-10-CM | POA: Diagnosis not present

## 2022-03-17 DIAGNOSIS — Z6841 Body Mass Index (BMI) 40.0 and over, adult: Secondary | ICD-10-CM | POA: Diagnosis not present

## 2022-03-17 DIAGNOSIS — E669 Obesity, unspecified: Secondary | ICD-10-CM | POA: Diagnosis not present

## 2022-03-17 DIAGNOSIS — I480 Paroxysmal atrial fibrillation: Secondary | ICD-10-CM | POA: Diagnosis not present

## 2022-03-17 DIAGNOSIS — Z4789 Encounter for other orthopedic aftercare: Secondary | ICD-10-CM | POA: Diagnosis not present

## 2022-03-17 DIAGNOSIS — E119 Type 2 diabetes mellitus without complications: Secondary | ICD-10-CM | POA: Diagnosis not present

## 2022-03-18 ENCOUNTER — Other Ambulatory Visit: Payer: Self-pay | Admitting: Cardiology

## 2022-03-21 ENCOUNTER — Ambulatory Visit: Payer: Medicare Other | Admitting: Cardiology

## 2022-03-23 DIAGNOSIS — E1169 Type 2 diabetes mellitus with other specified complication: Secondary | ICD-10-CM | POA: Diagnosis not present

## 2022-03-23 DIAGNOSIS — R319 Hematuria, unspecified: Secondary | ICD-10-CM | POA: Diagnosis not present

## 2022-03-24 ENCOUNTER — Emergency Department (HOSPITAL_COMMUNITY): Payer: Medicare Other

## 2022-03-24 ENCOUNTER — Encounter (HOSPITAL_COMMUNITY): Payer: Self-pay

## 2022-03-24 ENCOUNTER — Other Ambulatory Visit: Payer: Self-pay

## 2022-03-24 ENCOUNTER — Emergency Department (HOSPITAL_COMMUNITY)
Admission: EM | Admit: 2022-03-24 | Discharge: 2022-03-24 | Disposition: A | Discharge status: Home / Self Care | Payer: Medicare Other | Attending: Emergency Medicine | Admitting: Emergency Medicine

## 2022-03-24 DIAGNOSIS — N3001 Acute cystitis with hematuria: Secondary | ICD-10-CM | POA: Diagnosis not present

## 2022-03-24 DIAGNOSIS — R319 Hematuria, unspecified: Secondary | ICD-10-CM | POA: Diagnosis present

## 2022-03-24 DIAGNOSIS — Z8546 Personal history of malignant neoplasm of prostate: Secondary | ICD-10-CM | POA: Insufficient documentation

## 2022-03-24 DIAGNOSIS — I10 Essential (primary) hypertension: Secondary | ICD-10-CM | POA: Insufficient documentation

## 2022-03-24 DIAGNOSIS — Z7901 Long term (current) use of anticoagulants: Secondary | ICD-10-CM | POA: Insufficient documentation

## 2022-03-24 DIAGNOSIS — N3000 Acute cystitis without hematuria: Secondary | ICD-10-CM | POA: Diagnosis not present

## 2022-03-24 DIAGNOSIS — Z79899 Other long term (current) drug therapy: Secondary | ICD-10-CM | POA: Diagnosis not present

## 2022-03-24 DIAGNOSIS — N289 Disorder of kidney and ureter, unspecified: Secondary | ICD-10-CM | POA: Diagnosis not present

## 2022-03-24 DIAGNOSIS — Z91040 Latex allergy status: Secondary | ICD-10-CM | POA: Insufficient documentation

## 2022-03-24 DIAGNOSIS — R600 Localized edema: Secondary | ICD-10-CM | POA: Insufficient documentation

## 2022-03-24 DIAGNOSIS — R109 Unspecified abdominal pain: Secondary | ICD-10-CM | POA: Diagnosis not present

## 2022-03-24 LAB — URINALYSIS, ROUTINE W REFLEX MICROSCOPIC
Bilirubin Urine: NEGATIVE
Glucose, UA: >=500 mg/dL — AB
Ketones, ur: NEGATIVE mg/dL
Leukocytes,Ua: NEGATIVE
Nitrite: NEGATIVE
Protein, ur: NEGATIVE mg/dL
Specific Gravity, Urine: 1.015 (ref 1.005–1.030)
pH: 7 (ref 5.0–8.0)

## 2022-03-24 LAB — CBC WITH DIFFERENTIAL/PLATELET
Abs Immature Granulocytes: 0.02 10*3/uL (ref 0.00–0.07)
Basophils Absolute: 0 10*3/uL (ref 0.0–0.1)
Basophils Relative: 1 %
Eosinophils Absolute: 0.1 10*3/uL (ref 0.0–0.5)
Eosinophils Relative: 2 %
HCT: 31.8 % — ABNORMAL LOW (ref 39.0–52.0)
Hemoglobin: 10 g/dL — ABNORMAL LOW (ref 13.0–17.0)
Immature Granulocytes: 0 %
Lymphocytes Relative: 39 %
Lymphs Abs: 2.4 10*3/uL (ref 0.7–4.0)
MCH: 30.7 pg (ref 26.0–34.0)
MCHC: 31.4 g/dL (ref 30.0–36.0)
MCV: 97.5 fL (ref 80.0–100.0)
Monocytes Absolute: 0.6 10*3/uL (ref 0.1–1.0)
Monocytes Relative: 10 %
Neutro Abs: 2.9 10*3/uL (ref 1.7–7.7)
Neutrophils Relative %: 48 %
Platelets: 183 10*3/uL (ref 150–400)
RBC: 3.26 MIL/uL — ABNORMAL LOW (ref 4.22–5.81)
RDW: 16.9 % — ABNORMAL HIGH (ref 11.5–15.5)
WBC: 6 10*3/uL (ref 4.0–10.5)
nRBC: 0 % (ref 0.0–0.2)

## 2022-03-24 LAB — LACTIC ACID, PLASMA: Lactic Acid, Venous: 1.5 mmol/L (ref 0.5–1.9)

## 2022-03-24 LAB — PROTIME-INR
INR: 3 — ABNORMAL HIGH (ref 0.8–1.2)
Prothrombin Time: 30.7 seconds — ABNORMAL HIGH (ref 11.4–15.2)

## 2022-03-24 LAB — COMPREHENSIVE METABOLIC PANEL
ALT: 9 U/L (ref 0–44)
AST: 14 U/L — ABNORMAL LOW (ref 15–41)
Albumin: 3.4 g/dL — ABNORMAL LOW (ref 3.5–5.0)
Alkaline Phosphatase: 101 U/L (ref 38–126)
Anion gap: 9 (ref 5–15)
BUN: 19 mg/dL (ref 8–23)
CO2: 24 mmol/L (ref 22–32)
Calcium: 8.8 mg/dL — ABNORMAL LOW (ref 8.9–10.3)
Chloride: 107 mmol/L (ref 98–111)
Creatinine, Ser: 1.05 mg/dL (ref 0.61–1.24)
GFR, Estimated: >60 mL/min (ref >60)
Glucose, Bld: 136 mg/dL — ABNORMAL HIGH (ref 70–99)
Potassium: 3.9 mmol/L (ref 3.5–5.1)
Sodium: 140 mmol/L (ref 135–145)
Total Bilirubin: 0.7 mg/dL (ref 0.3–1.2)
Total Protein: 6.1 g/dL — ABNORMAL LOW (ref 6.5–8.1)

## 2022-03-24 LAB — URINALYSIS, MICROSCOPIC (REFLEX)

## 2022-03-24 MED ORDER — SODIUM CHLORIDE 0.9 % IV SOLN
1.0000 g | Freq: Once | INTRAVENOUS | Status: AC
  Administered 2022-03-24: 1 g via INTRAVENOUS
  Filled 2022-03-24: qty 10

## 2022-03-24 MED ORDER — CEFUROXIME AXETIL 500 MG PO TABS: 500.0000 mg | ORAL_TABLET | Freq: Two times a day (BID) | ORAL | 0 refills | Status: DC

## 2022-03-24 NOTE — Discharge Instructions (Addendum)
Take the antibiotics as prescribed for possible urine infection.  Make sure to stay well-hydrated especially if you notice any blood in urine.  Follow-up with your urologist as we discussed.  Return to emergency room for fevers, vomiting, inability to urinate

## 2022-03-24 NOTE — ED Provider Notes (Signed)
[facility] Provider Note   CSN: [id number] Arrival date & time: 03/24/22  [id number]     History  Chief Complaint  Patient presents with   Hematuria/Back Pain     Taking Xarelto    Carlos Dawson is a 82 y.o. male.  HPI   Patient has a history of hypertension and obesity PE LVH atherosclerotic cardiovascular disease, reflux, IBS, aortic root enlargement, aortic aneurysm, atrial fibrillation, DVT, prostate cancer who presents to the ED for evaluation of hematuria.  Patient states he has been having issues with intermittent hematuria over the last couple of weeks.  Patient also has been having pain in his right flank.  He feels like it is a rock.  More recently he has started passing clots.  He was seen in the emergency room a couple weeks ago for the same issues.  He had laboratory tests as well as a CT scan.  The CT scan showed no acute abnormalities.  Patient was referred to urology for follow-up.  Patient also has noted some bruising in the right flank area.  He did have surgery on his lower spine but that was back in January.  Patient denies any recent falls or injury.  Patient is scheduled for a cystoscopy but it is not until later this month  Patient has also noticed swelling in his lower extremities.  He is not having any trouble with any chest pain or shortness of breath.  Home Medications Prior to Admission medications   Medication Sig Start Date End Date Taking? Authorizing Provider  cefUROXime (CEFTIN) 500 MG tablet Take 1 tablet (500 mg total) by mouth 2 (two) times daily with a meal. 03/24/22  Yes Dorie Rank, MD  acetaminophen (TYLENOL) 500 MG tablet Take 1,000 mg by mouth every 8 (eight) hours as needed for mild pain or headache.     [provider]  albuterol (VENTOLIN HFA) 108 (90 Base) MCG/ACT inhaler Inhale 2 puffs into the lungs every 4 (four) hours as needed for wheezing or shortness of breath. 08/13/20   Chesley Mires, MD   augmented betamethasone dipropionate (DIPROLENE-AF) 0.05 % cream Apply 1 application topically 2 (two) times daily as needed for itching. 01/31/17   [provider]  baclofen (LIORESAL) 10 MG tablet Take 10 mg by mouth 3 (three) times daily as needed. 02/03/22   [provider]  budesonide-formoterol (SYMBICORT) 160-4.5 MCG/ACT inhaler Inhale 2 puffs into the lungs 2 (two) times daily. As needed    [provider]  Cholecalciferol (VITAMIN D-3) 5000 units TABS Take 5,000 Units by mouth daily.    [provider]  Coenzyme Q10 100 MG capsule Take 100 mg by mouth daily.     [provider]  dextromethorphan-guaiFENesin (MUCINEX DM) 30-600 MG 12hr tablet Take 2 tablets by mouth as needed.    [provider]  diphenoxylate-atropine (LOMOTIL) 2.5-0.025 MG tablet Take 1 tablet by mouth 4 (four) times daily as needed. 03/02/21   [provider]  fluticasone (FLONASE) 50 MCG/ACT nasal spray Place 1 spray into both nostrils as needed for allergies or rhinitis.    [provider]  furosemide (LASIX) 40 MG tablet TAKE ONE TABLET ('40MG'$ ) BY MOUTH EACH DAY AS NEEDED FOR SWELLING OR WEIGHT GAIN. 03/20/22   Martinique, [name], MD  furosemide (LASIX) 80 MG tablet Take 1 tablet (80 mg total) by mouth daily. 01/04/21 01/03/22  Sherran Needs, NP  HYDROcodone-acetaminophen (NORCO) 10-325 MG tablet Take 1  tablet by mouth every 4 (four) hours as needed. 02/03/22   [provider]  JARDIANCE 25 MG TABS tablet Take 1 tablet by mouth daily. 04/18/18   [provider]  losartan (COZAAR) 25 MG tablet TAKE 1 TABLET BY MOUTH EVERY DAY 07/28/20   [place], [name], MD  methocarbamol (ROBAXIN) 500 MG tablet Take 1 tablet (500 mg total) by mouth every 8 (eight) hours as needed for muscle spasms. 10/20/16   Eustace Moore, MD  metoprolol tartrate (LOPRESSOR) 25 MG tablet Take 12.5 mg by mouth 2 (two) times daily.    [provider]  nitroGLYCERIN  (NITROSTAT) 0.4 MG SL tablet Place 1 tablet (0.4 mg total) under the tongue every 5 (five) minutes as needed for chest pain. 09/08/21   [place], [name], MD  pantoprazole (PROTONIX) 40 MG tablet TAKE ONE TABLET BY MOUTH BEFORE BREAKFAST (TAKE ON AN EMPTY STOMACH 30 MINUTES PRIOR TO A MEAL) 03/09/21   [provider]  Polyethyl Glycol-Propyl Glycol (SYSTANE OP) Place 1 drop into both eyes daily as needed (for dry eyes).     [provider]  potassium chloride SA (KLOR-CON M20) 20 MEQ tablet Taking two tablet by mouth in the am and 1 tablet in the evening 01/04/21   Sherran Needs, NP  psyllium (METAMUCIL) 58.6 % packet Take 1 packet by mouth daily.    [provider]  rivaroxaban (XARELTO) 20 MG TABS tablet Take 1 tablet (20 mg total) by mouth daily with supper. 03/14/17   [place], [name], MD  sildenafil (VIAGRA) 100 MG tablet Take 1 tablet (100 mg total) by mouth as needed for erectile dysfunction. 07/13/20   [place], [name], MD  Skin Protectants, Misc. (EUCERIN) cream Apply 1 application topically as needed for dry skin.    [provider]  spironolactone (ALDACTONE) 25 MG tablet Take 25 mg by mouth daily.    [provider]  tamsulosin (FLOMAX) 0.4 MG CAPS Take 0.4 mg by mouth every evening.     [provider]  topiramate (TOPAMAX) 25 MG capsule Take 50 mg by mouth 2 (two) times daily.     [provider]  traMADol (ULTRAM) 50 MG tablet Take 100 mg by mouth every 6 (six) hours as needed. 04/13/21   [provider]      Allergies    Other, Lotensin [benazepril], Tofranil [imipramine], Ace inhibitors, Adhesive [tape], Amoxicillin-pot clavulanate, Codeine, Latex, Metformin, Morphine, Nifedipine, and Quinolones    Review of Systems   Review of Systems  Physical Exam Updated Vital Signs BP (!) 109/55   Pulse 70   Temp 97.9 F (36.6 C) (Oral)   Resp 20   SpO2 97%  Physical Exam Vitals and nursing note reviewed.   Constitutional:      General: He is not in acute distress.    Appearance: He is well-developed.  HENT:     Head: Normocephalic and atraumatic.     Right Ear: External ear normal.     Left Ear: External ear normal.  Eyes:     General: No scleral icterus.       Right eye: No discharge.        Left eye: No discharge.     Conjunctiva/sclera: Conjunctivae normal.  Neck:     Trachea: No tracheal deviation.  Cardiovascular:     Rate and Rhythm: Normal rate and regular rhythm.  Pulmonary:     Effort: Pulmonary effort is normal. No respiratory distress.  Breath sounds: Normal breath sounds. No stridor. No wheezing or rales.  Abdominal:     General: Bowel sounds are normal. There is no distension.     Palpations: Abdomen is soft.     Tenderness: There is no abdominal tenderness. There is right CVA tenderness. There is no guarding or rebound.     Comments: Approximately 3 x 3 cm area of faint bruising right flank  Musculoskeletal:        General: No tenderness or deformity.     Cervical back: Neck supple.     Right lower leg: Edema present.     Left lower leg: Edema present.  Skin:    General: Skin is warm and dry.     Findings: No rash.  Neurological:     General: No focal deficit present.     Mental Status: He is alert.     Cranial Nerves: No cranial nerve deficit, dysarthria or facial asymmetry.     Sensory: No sensory deficit.     Motor: No abnormal muscle tone or seizure activity.     Coordination: Coordination normal.  Psychiatric:        Mood and Affect: Mood normal.     ED Results / Procedures / Treatments   Labs (all labs ordered are listed, but only abnormal results are displayed) Labs Reviewed  URINALYSIS, ROUTINE W REFLEX MICROSCOPIC - Abnormal; Notable for the following components:      Result Value   Glucose, UA >=500 (*)    Hgb urine dipstick SMALL (*)    All other components within normal limits  CBC WITH DIFFERENTIAL/PLATELET - Abnormal; Notable for the  following components:   RBC 3.26 (*)    Hemoglobin 10.0 (*)    HCT 31.8 (*)    RDW 16.9 (*)    All other components within normal limits  COMPREHENSIVE METABOLIC PANEL - Abnormal; Notable for the following components:   Glucose, Bld 136 (*)    Calcium 8.8 (*)    Total Protein 6.1 (*)    Albumin 3.4 (*)    AST 14 (*)    All other components within normal limits  PROTIME-INR - Abnormal; Notable for the following components:   Prothrombin Time 30.7 (*)    INR 3.0 (*)    All other components within normal limits  URINALYSIS, MICROSCOPIC (REFLEX) - Abnormal; Notable for the following components:   Bacteria, UA RARE (*)    All other components within normal limits  LACTIC ACID, PLASMA  URINALYSIS, W/ REFLEX TO CULTURE (INFECTION SUSPECTED)    EKG None  Radiology CT Renal Stone Study  Result Date: 03/24/2022 CLINICAL DATA:  Abdominal flank pain with intermittent hematuria. EXAM: CT ABDOMEN AND PELVIS WITHOUT CONTRAST TECHNIQUE: Multidetector CT imaging of the abdomen and pelvis was performed following the standard protocol without IV contrast. RADIATION DOSE REDUCTION: This exam was performed according to the departmental dose-optimization program which includes automated exposure control, adjustment of the mA and/or kV according to patient size and/or use of iterative reconstruction technique. COMPARISON:  03/09/2022 FINDINGS: Lower chest: Unremarkable. Hepatobiliary: No suspicious focal abnormality in the liver on this study without intravenous contrast. Gallbladder surgically absent No intrahepatic or extrahepatic biliary dilation. Pancreas: No focal mass lesion. No dilatation of the main duct. No intraparenchymal cyst. No peripancreatic edema. Spleen: No splenomegaly. No focal mass lesion. Adrenals/Urinary Tract: No adrenal nodule or mass. Right kidney unremarkable. 6 mm exophytic lesion lower interpolar left kidney is stable since CT 02/14/2018 consistent with benign etiology.  No followup  imaging is recommended. No evidence for hydroureter. The urinary bladder appears normal for the degree of distention. Stomach/Bowel: Lap band again noted. Stomach otherwise unremarkable. Duodenum is normally positioned as is the ligament of Treitz. No small bowel wall thickening. No small bowel dilatation. The terminal ileum is normal. The appendix is not well visualized, but there is no edema or inflammation in the region of the cecum. No gross colonic mass. No colonic wall thickening. Vascular/Lymphatic: There is moderate atherosclerotic calcification of the abdominal aorta without aneurysm. There is no gastrohepatic or hepatoduodenal ligament lymphadenopathy. No retroperitoneal or mesenteric lymphadenopathy. No pelvic sidewall lymphadenopathy. Reproductive: The prostate gland and seminal vesicles are unremarkable. Other: No intraperitoneal free fluid. Musculoskeletal: Degenerative changes noted left hip. Status post right total hip replacement fixation hardware noted thoracolumbar spine. IMPRESSION: 1. No acute findings in the abdomen or pelvis. Specifically, no findings to explain the patient's history of flank pain and hematuria. 2.  Aortic Atherosclerosis (ICD10-I70.0). Electronically Signed   By: Misty Stanley M.D.   On: 03/24/2022 07:37    Procedures Procedures    Medications Ordered in ED Medications  cefTRIAXone (ROCEPHIN) 1 g in sodium chloride 0.9 % 100 mL IVPB (0 g Intravenous Stopped 03/24/22 0931)    ED Course/ Medical Decision Making/ A&P Clinical Course as of 03/24/22 1033  Fri Mar 24, 2022  0715 Urinalysis with red blood cells and white blood cells rare bacteria.  Metabolic panel shows hemoglobin of 10, similar to baseline [JK]  0830 Urinalysis does show 21-50 red blood cells white blood cells and rare bacteria [JK]  [id number] Metabolic panel shows normal renal function [JK]  0831 CT scan does not show any acute abnormality [JK]    Clinical Course User Index [JK] Dorie Rank, MD                              Medical Decision Making Problems Addressed: Acute cystitis without hematuria: acute illness or injury Hematuria, unspecified type: acute illness or injury  Amount and/or Complexity of Data Reviewed Labs: ordered. Decision-making details documented in ED Course. Radiology: ordered and independent interpretation performed. Discussion of management or test interpretation with external provider(s): Case discussed with [facility] urology on-call.  Patient is appropriate for outpatient follow-up.  Agrees with antibiotic treatment.  Risk Prescription drug management.    presented to the ED for evaluation of hematuria.  Patient has been having issues for several weeks.  Patient has been evaluated by urology at [facility].  Patient is scheduled for a cystoscopy.  Patient had recurrent hematuria with passage of clots that brought him to the ED.  He is not having any fevers or chills although initially his temperature was low-grade here.  Urinalysis does suggest the possibility of UTI.  Patient was given a dose of antibiotics and will discharge home on antibiotics.  He does not have any evidence of urinary retention.  His hemoglobin is stable.  Patient is on anticoagulation however he has had a history of pulmonary embolism.   He is not having any gross hematuria now and his hemoglobin is stable.  I do think it is appropriate for him to continue that medication for now.  Patient's family states they will contact his cardiologist to see if it is possible to stop the medication temporarily.  I did discuss the case with [facility] urology and they recommend continued outpatient evaluation.  No other acute interventions necessary  at this time.        Final Clinical Impression(s) / ED Diagnoses Final diagnoses:  Hematuria, unspecified type  Acute cystitis without hematuria    Rx / DC Orders ED Discharge Orders          Ordered    cefUROXime (CEFTIN) 500 MG  tablet  2 times daily with meals        03/24/22 1029              Dorie Rank, MD 03/24/22 [phone]

## 2022-03-24 NOTE — ED Triage Notes (Addendum)
Patient reports right lower back pain with intermittent hematuria /clots for 2 weeks . He is taking Xarelto , denies injury or fall .

## 2022-03-27 DIAGNOSIS — Z7901 Long term (current) use of anticoagulants: Secondary | ICD-10-CM | POA: Diagnosis not present

## 2022-03-27 DIAGNOSIS — R310 Gross hematuria: Secondary | ICD-10-CM | POA: Diagnosis not present

## 2022-03-27 DIAGNOSIS — C61 Malignant neoplasm of prostate: Secondary | ICD-10-CM | POA: Diagnosis not present

## 2022-03-29 DIAGNOSIS — M533 Sacrococcygeal disorders, not elsewhere classified: Secondary | ICD-10-CM | POA: Diagnosis not present

## 2022-03-29 DIAGNOSIS — Z6839 Body mass index (BMI) 39.0-39.9, adult: Secondary | ICD-10-CM | POA: Diagnosis not present

## 2022-03-29 DIAGNOSIS — M461 Sacroiliitis, not elsewhere classified: Secondary | ICD-10-CM | POA: Diagnosis not present

## 2022-03-31 ENCOUNTER — Ambulatory Visit (HOSPITAL_COMMUNITY): Payer: Medicare Other | Attending: Adult Health

## 2022-03-31 DIAGNOSIS — I350 Nonrheumatic aortic (valve) stenosis: Secondary | ICD-10-CM | POA: Diagnosis not present

## 2022-03-31 LAB — ECHOCARDIOGRAM COMPLETE
AV Mean grad: 13 mmHg
AV Peak grad: 23.7 mmHg
Ao pk vel: 2.43 m/s
Area-P 1/2: 3.34 cm2
S' Lateral: 3.2 cm

## 2022-03-31 NOTE — Progress Notes (Signed)
Cardiology Clinic Note   Patient Name: Carlos Dawson Date of Encounter: 04/07/2022  Primary Care Provider:  Charlane Ferretti, MD Primary Cardiologist:  Peter Martinique, MD  Patient Profile    83 y.o. male with past medical history of CAD (s/p BMS to 2nd OM in 2012, low-risk NST in 12/2012), Cath in 2019 with nonobstructive disease, PAF (on Xarelto), is followed by the Afib clinic; CHADS VASC Score of 6 ,(HTN, DM, Vascular, Age, TE (2);  chronic diastolic CHF, prior PE, HTN, HLD, Type 2 DM, and known thoracic aortic aneurysm. He was recommended for Watchman device and was referred to [facility], EP. The patient wished to delay Watchman device surgery until after back surgery, scheduled for January 2024.   Seen in ED on 03/24/2022 for hematuria after seeing urologist. Had a cystocopy and a catheter placed. Found to have bloody urine in catheter bag. Foley catheter was removed in the ED. On follow up with urologist, Dr. Tresa Endo, It appeared that his hematuria was related to radiation cystitis. Recommended he should  stay off of the Xarelto, it would greatly decrease his chance of recurrent bleeding. He will speak to him and see if it is possible to get off the Xarelto.  Past Medical History    Past Medical History:  Diagnosis Date   Anticoagulant long-term use    Aortic root enlargement (HCC)    Ascending aortic aneurysm ([place])    recent scan in October 2012 showing no change; followed by Dr. Servando Snare   ASCVD (arteriosclerotic cardiovascular disease)    Prior BMS to the 2nd OM in September 2012; with repeat cath in October showing patency   CAD (coronary artery disease)    a. s/p BMS to 2nd OM in Sept 2012; b. LexiScan Myoview (12/2012):  Inf infarct; bowel and motion artifact make study difficult to interpret; no ischemia; not gated; Low Risk   CHF (congestive heart failure) (HCC)    no recent issues 10/13/14   Chronic back pain    "all over my back" (05/11/2017)   Colonic polyp    Contact  lens/glasses fitting    Diastolic dysfunction    DVT (deep venous thrombosis) ([place])    ?LLE   Frequent headaches    "probably weekly" (05/11/2017)   Generalized headaches    neck stenosis   GERD (gastroesophageal reflux disease)    Hearing loss    Hearing loss    more so on left   Hemorrhoids    History of stomach ulcers    Hypertension    IBS (irritable bowel syndrome)    LVH (left ventricular hypertrophy)    Mild intermittent asthma    OA (osteoarthritis)    "all over" (05/11/2017)   Obesities, morbid (HCC)    OSA (obstructive sleep apnea)    PSG 03/30/97 AHI 21, BPAP 13/9   OSA on CPAP    PAF (paroxysmal atrial fibrillation) ([place])    a. on Xarelto b. s/p DCCV in 08/2016; b. Tikosyn failed 04/16/17 with plans for Multaq and possible Afib ablation with Dr. Rayann Heman   Pneumonia    'several times" (05/11/2017)   Prostate CA [facility])    Oncologist  DR. Daralene Milch [facility] 09/24/14, undetermined tx   prostate; S/P "radiation and hormone injections"   Pulmonary embolism ([place]) 2008   "both lungs"   SOB (shortness of breath)    on excertion   Thoracic aortic aneurysm (HCC)    Aortic Size Index=     5.0    /  Body surface area is 2.43 meters squared. = 2.05  < 2.75 cm/m2      4% risk per year 2.75 to 4.25          8% risk per year > 4.25 cm/m2    20% risk per year   Stable aneurysmal dilation of the ascending aorta with maximum AP diameter of 4.8 cm. Stable area of narrowing of the proximal most portion of the descending aorta measuring 2 cm., previously identified as an area of coarctation. No evidence of aortic dissection.  Coronary artery disease.  Normal appearance of the lungs.   Electronically Signed   By: Fidela Salisbury M.D.   On: 10/01/2014 08:50     Type II diabetes mellitus ([place])    metphormin, average 154 dx 2017   Past Surgical History:  Procedure Laterality Date   ACHILLES TENDON REPAIR Bilateral    AORTIC ARCH ANGIOGRAPHY N/A 03/13/2017   Procedure: AORTIC ARCH ANGIOGRAPHY;   Surgeon: Martinique, [name], MD;  Location: [place] [facility];  Service: Cardiovascular;  Laterality: N/A;   APPENDECTOMY     ATRIAL FIBRILLATION ABLATION  05/11/2017   ATRIAL FIBRILLATION ABLATION N/A 05/11/2017   Procedure: ATRIAL FIBRILLATION ABLATION;  Surgeon: [name], MD;  Location: [place] [facility];  Service: Cardiovascular;  Laterality: N/A;   BACK SURGERY     "I've had 7 back and 1 neck ORs" (05/11/2017)   BIOPSY  03/14/2018   Procedure: BIOPSY;  Surgeon: Ronnette Juniper, MD;  Location: [facility];  Service: Gastroenterology;;  EGD and Colon   CARDIAC CATHETERIZATION  2006   CARPAL TUNNEL RELEASE Bilateral    LEFT   CATARACT EXTRACTION W/ INTRAOCULAR LENS  IMPLANT, BILATERAL Bilateral    CERVICAL SPINE SURGERY  06/02/2010   lower back and neck   COLONOSCOPY N/A 03/14/2018   Procedure: COLONOSCOPY;  Surgeon: Ronnette Juniper, MD;  Location: [facility];  Service: Gastroenterology;  Laterality: N/A;   COLONOSCOPY WITH PROPOFOL N/A 12/29/2014   Procedure: COLONOSCOPY WITH PROPOFOL;  Surgeon: Garlan Fair, MD;  Location: [facility];  Service: Endoscopy;  Laterality: N/A;   CORONARY ANGIOPLASTY WITH STENT PLACEMENT  October 2012   CORONARY STENT PLACEMENT  Sept 2012   2nd OM with BMS   ESOPHAGOGASTRODUODENOSCOPY N/A 03/14/2018   Procedure: ESOPHAGOGASTRODUODENOSCOPY (EGD);  Surgeon: Ronnette Juniper, MD;  Location: [facility];  Service: Gastroenterology;  Laterality: N/A;   HEMORROIDECTOMY     LAMINECTOMY  05/30/2012   L 4 L5   LAMINECTOMY WITH POSTERIOR LATERAL ARTHRODESIS LEVEL 3 N/A 10/18/2016   Procedure: Posterior Lateral Fusion - Lumbar One-Four, segmental instrumentation Lumbar One-Five,  decompression,;  Surgeon: Eustace Moore, MD;  Location: [place];  Service: Neurosurgery;  Laterality: N/A;   LAPAROSCOPIC CHOLECYSTECTOMY     LAPAROSCOPIC GASTRIC BANDING     LEFT AND RIGHT HEART CATHETERIZATION WITH CORONARY ANGIOGRAM N/A 05/07/2014   Procedure: LEFT AND RIGHT HEART  CATHETERIZATION WITH CORONARY ANGIOGRAM;  Surgeon: [name] Martinique, MD;  Location: [facility];  Service: Cardiovascular;  Laterality: N/A;   LEFT HEART CATH AND CORONARY ANGIOGRAPHY N/A 03/13/2017   Procedure: LEFT HEART CATH AND CORONARY ANGIOGRAPHY;  Surgeon: Martinique, [name], MD;  Location: [place] [facility];  Service: Cardiovascular;  Laterality: N/A;   LUMBAR LAMINECTOMY/DECOMPRESSION MICRODISCECTOMY N/A 05/04/2016   Procedure: Laminectomy and Foraminotomy - Thoracic twelve-Lumbar one -Posterior Fusion Lumbar one-two;  Surgeon: Eustace Moore, MD;  Location: [place];  Service: Neurosurgery;  Laterality: N/A;   POLYPECTOMY  03/14/2018  Procedure: POLYPECTOMY;  Surgeon: Ronnette Juniper, MD;  Location: [facility];  Service: Gastroenterology;;   POSTERIOR LUMBAR FUSION  10/18/2016   SHOULDER OPEN ROTATOR CUFF REPAIR Bilateral    TONSILLECTOMY AND ADENOIDECTOMY     TOTAL HIP ARTHROPLASTY Right 10/18/2018   Procedure: RIGHT TOTAL HIP ARTHROPLASTY ANTERIOR APPROACH;  Surgeon: Mcarthur Rossetti, MD;  Location: [facility];  Service: Orthopedics;  Laterality: Right;   TRIGGER FINGER RELEASE     LEFT   UVULOPALATOPHARYNGOPLASTY     VASECTOMY      Allergies  Allergies  Allergen Reactions   Other     Other reaction(s): cough, Other Other reaction(s): cough  Other reaction(s): wt gain   Lotensin [Benazepril] Cough   Tofranil [Imipramine]     Weight gain   Ace Inhibitors Cough   Adhesive [Tape] Itching and Rash   Amoxicillin-Pot Clavulanate Other (See Comments)    Other reaction(s): GI Upset (intolerance)   Codeine Nausea Only    Other reaction(s): nausea Other reaction(s): nausea Other reaction(s): nausea    Latex Itching, Rash and Other (See Comments)    Bandaids   Metformin Diarrhea   Morphine Itching   Nifedipine Other (See Comments)    Other reaction(s): leg edema   Quinolones Other (See Comments)    Patient was warned about not using Cipro and similar antibiotics. Recent studies  have raised concern that fluoroquinolone antibiotics could be associated with an increased risk of aortic aneurysm Fluoroquinolones have non-antimicrobial properties that might jeopardise the integrity of the extracellular matrix of the vascular wall In a  propensity score matched cohort study in [place], there was a 66% increased rate of aortic aneurysm or dissection associated with oral fluoroquinolone use, compared wit    History of Present Illness  Mr. Ledman comes today for ongoing assessment and management of coronary artery disease, hypertension, permanent atrial fibrillation, diastolic CHF, with history of thoracic aortic aneurysm, and hyperlipidemia.  He has other noncardiac history of bladder lesion for which she has been treated with radiation, chronic back pain.  Seen in ED on 03/24/2022 for hematuria after seeing urologist. Had a cystocopy which did find an abnormal lesion in his bladder, and a catheter placed. Found to have bloody urine in catheter bag. Foley catheter was removed in the ED. On follow up with urologist, Dr. Tresa Endo, It appeared that his hematuria was related to radiation cystitis from prostate cancer treatment..  It was recommended that he should  stay off of the Xarelto.    Mr. Glunt states that he is ready to stop the Xarelto and is interested in moving forward with Watchman Device.  He has seen Dr. Quentin Ore in the past and has had instruction and education concerning the procedure.  It is also noted that he has not had his back surgery in January 2024, and has recovered well.    He is also followed by the [facility] clinic, and has a new physician who wishes a list of his medications and office notes to be sent to have on record for follow-up visit with them.  Home Medications    Current Outpatient Medications  Medication Sig Dispense Refill   acetaminophen (TYLENOL) 500 MG tablet Take 1,000 mg by mouth every 8 (eight) hours as needed for mild pain or headache.       albuterol (VENTOLIN HFA) 108 (90 Base) MCG/ACT inhaler Inhale 2 puffs into the lungs every 4 (four) hours as needed for wheezing or shortness of breath. 18 g 1   augmented  betamethasone dipropionate (DIPROLENE-AF) 0.05 % cream Apply 1 application topically 2 (two) times daily as needed for itching.  3   baclofen (LIORESAL) 10 MG tablet Take 10 mg by mouth 3 (three) times daily as needed.     budesonide-formoterol (SYMBICORT) 160-4.5 MCG/ACT inhaler Inhale 2 puffs into the lungs 2 (two) times daily. As needed     Cholecalciferol (VITAMIN D-3) 5000 units TABS Take 5,000 Units by mouth daily.     Coenzyme Q10 100 MG capsule Take 100 mg by mouth daily.      dextromethorphan-guaiFENesin (MUCINEX DM) 30-600 MG 12hr tablet Take 2 tablets by mouth as needed.     diphenoxylate-atropine (LOMOTIL) 2.5-0.025 MG tablet Take 1 tablet by mouth 4 (four) times daily as needed.     fluticasone (FLONASE) 50 MCG/ACT nasal spray Place 1 spray into both nostrils as needed for allergies or rhinitis.     furosemide (LASIX) 40 MG tablet TAKE ONE TABLET (40MG ) BY MOUTH EACH DAY AS NEEDED FOR SWELLING OR WEIGHT GAIN. 45 tablet 8   JARDIANCE 25 MG TABS tablet Take 1 tablet by mouth daily.     losartan (COZAAR) 25 MG tablet TAKE 1 TABLET BY MOUTH EVERY DAY 90 tablet 3   methocarbamol (ROBAXIN) 500 MG tablet Take 1 tablet (500 mg total) by mouth every 8 (eight) hours as needed for muscle spasms. 60 tablet 1   metoprolol tartrate (LOPRESSOR) 25 MG tablet Take 12.5 mg by mouth 2 (two) times daily.     nitroGLYCERIN (NITROSTAT) 0.4 MG SL tablet Place 1 tablet (0.4 mg total) under the tongue every 5 (five) minutes as needed for chest pain. 25 tablet 5   pantoprazole (PROTONIX) 40 MG tablet TAKE ONE TABLET BY MOUTH BEFORE BREAKFAST (TAKE ON AN EMPTY STOMACH 30 MINUTES PRIOR TO A MEAL)     Polyethyl Glycol-Propyl Glycol (SYSTANE OP) Place 1 drop into both eyes daily as needed (for dry eyes).      potassium chloride SA (KLOR-CON M20)  20 MEQ tablet Taking two tablet by mouth in the am and 1 tablet in the evening     psyllium (METAMUCIL) 58.6 % packet Take 1 packet by mouth daily.     sildenafil (VIAGRA) 100 MG tablet Take 1 tablet (100 mg total) by mouth as needed for erectile dysfunction. 10 tablet 6   Skin Protectants, Misc. (EUCERIN) cream Apply 1 application topically as needed for dry skin.     tamsulosin (FLOMAX) 0.4 MG CAPS Take 0.4 mg by mouth every evening.      topiramate (TOPAMAX) 25 MG capsule Take 50 mg by mouth 2 (two) times daily.      furosemide (LASIX) 80 MG tablet Take 1 tablet (80 mg total) by mouth daily.     spironolactone (ALDACTONE) 25 MG tablet Take 1 tablet (25 mg total) by mouth daily. 90 tablet 3   No current facility-administered medications for this visit.     Family History    Family History  Problem Relation Age of Onset   Heart disease Mother    Diabetes Mother    Other Mother        stent placement   Emphysema Father 23   Heart attack Sister    He indicated that his mother is alive. He indicated that his father is deceased. He indicated that only one of his two sisters is alive. He indicated that his brother is deceased. He indicated that his maternal grandmother is deceased. He indicated that his maternal grandfather is  deceased. He indicated that his paternal grandmother is deceased. He indicated that his paternal grandfather is deceased.  Social History    Social History   Socioeconomic History   Marital status: Married    Spouse name: Not on file   Number of children: 3   Years of education: Not on file   Highest education level: Not on file  Occupational History   Occupation: Retired from Scientist, clinical (histocompatibility and immunogenetics): RETIRED  Tobacco Use   Smoking status: Former    Packs/day: 1.50    Years: 30.00    Additional pack years: 0.00    Total pack years: 45.00    Types: Cigarettes    Start date: 28    Quit date: 01/24/1992    Years since quitting: 30.2   Smokeless tobacco: Never   Vaping Use   Vaping Use: Never used  Substance and Sexual Activity   Alcohol use: No   Drug use: No   Sexual activity: Not Currently  Other Topics Concern   Not on file  Social History Narrative   Not on file   Social Determinants of Health   Financial Resource Strain: Not on file  Food Insecurity: Not on file  Transportation Needs: Not on file  Physical Activity: Not on file  Stress: Not on file  Social Connections: Not on file  Intimate Partner Violence: Not on file     Review of Systems    General:  No chills, fever, night sweats or weight changes.  Cardiovascular:  No chest pain, dyspnea on exertion, edema, orthopnea, palpitations, paroxysmal nocturnal dyspnea. Dermatological: No rash, lesions/masses Respiratory: No cough, dyspnea Urologic:  positive for hematuria. dysuria Abdominal:   No nausea, vomiting, diarrhea, bright red blood per rectum, melena, or hematemesis,Neurologic:  No visual changes, wkns, changes in mental status. All other systems reviewed and are otherwise negative except as noted above.     Physical Exam    VS:  BP 132/78   Pulse 73   Ht 5\' 7"  (1.702 m)   Wt 253 lb 3.2 oz (114.9 kg)   SpO2 97%   BMI 39.66 kg/m  , BMI Body mass index is 39.66 kg/m.     GEN: Well nourished, well developed, in no acute distress. HEENT: normal. Neck: Supple, no JVD, carotid bruits, or masses. Cardiac: RRR, 2/6 systolic murmurs, rubs, or gallops. No clubbing, cyanosis, edema.  Radials/DP/PT 2+ and equal bilaterally.  Respiratory:  Respirations regular and unlabored, clear to auscultation bilaterally. GI: Soft, nontender, nondistended, BS + x 4.  Obese. MS: no deformity or atrophy.  Wearing back brace Skin: warm and dry, no rash. Neuro:  Strength and sensation are intact. Psych: Normal affect.  Accessory Clinical Findings    ECG personally reviewed by me today-bradycardia, wide QRS 132 ms, nonspecific intraventricular block, heart rate of 55 bpm- No acute  changes  Lab Results  Component Value Date   WBC 6.0 03/24/2022   HGB 10.0 (L) 03/24/2022   HCT 31.8 (L) 03/24/2022   MCV 97.5 03/24/2022   PLT 183 03/24/2022   Lab Results  Component Value Date   CREATININE 1.05 03/24/2022   BUN 19 03/24/2022   NA 140 03/24/2022   K 3.9 03/24/2022   CL 107 03/24/2022   CO2 24 03/24/2022   Lab Results  Component Value Date   ALT 9 03/24/2022   AST 14 (L) 03/24/2022   ALKPHOS 101 03/24/2022   BILITOT 0.7 03/24/2022   Lab Results  Component Value Date  CHOL 118 01/07/2013   HDL 40 01/07/2013   LDLCALC 51 01/07/2013   TRIG 133 01/07/2013   CHOLHDL 3.0 01/07/2013    Lab Results  Component Value Date   HGBA1C 6.7 (H) 10/15/2018    Review of Prior Studies: Myoview 03/06/17: Study Highlights    The left ventricular ejection fraction is normal (55-65%). Nuclear stress EF: 61%. There is a small defect of mild severity present in the basal inferior, mid inferior and apex location. The defect is partially reversible. Overall image quality is poor due to soft tissue attenuation. Cannot rule out a small area of ischemia. There is evidence of transient ischemic dilatation with a TID of 1.27. This is an intermediate risk study. There was no ST segment deviation noted during stress     Echo 03/07/17: Study Conclusions   - Left ventricle: The cavity size was normal. Wall thickness was   increased in a pattern of mild LVH. Systolic function was normal.   The estimated ejection fraction was in the range of 55% to 60%.   Wall motion was normal; there were no regional wall motion   abnormalities. Left ventricular diastolic function parameters   were normal. - Aortic valve: There was very mild stenosis. Valve area (VTI):   1.81 cm^2. Valve area (Vmax): 1.94 cm^2. Valve area (Vmean): 1.85   cm^2. - Right atrium: The atrium was mildly dilated. - Atrial septum: No defect or patent foramen ovale was identified. - Pulmonary arteries: PA peak  pressure: 54 mm Hg (S).   Cardiac cath 03/15/17: Conclusion      Previously placed Ost 2nd Mrg to 2nd Mrg stent (unknown type) is widely patent. There is no aortic valve regurgitation.   1. No significant obstructive CAD 2. Aneurysmal thoracic aorta.    Plan: continue medical management . Consider pulmonary evaluation for symptoms of dyspnea.     Assessment & Plan   1.  Paroxysmal atrial fibrillation: I have seen Mr. Pense in the office today who is being considered for a Watchman left atrial appendage closure device.  He has a history of paroxysmal  atrial fibrillation.  This patients CHA2DS2-VASc Score and unadjusted Ischemic Stroke Rate (% per year) is equal to 9.7 % stroke rate/year from a score of 6 which necessitates long term oral anticoagulation to prevent stroke.  Unfortunately, He is not felt to be a long term Warfarin candidate secondary to chronic hematuria from radiation cystitis. The patients chart has been reviewed and I feel that they would not be a candidate for short term oral anticoagulation.    I have discussed this with Dr. Martinique prior to change in medication regimen..  Given his history of recurrent hematuria, and recommendations also from urologist, we will stop Xarelto today  He will be referred back to Dr. Quentin Ore for further discussion and planning for Watchman device implantation.  I have also discussed this with the patient who verbalizes understanding and is willing to proceed and understands to stop taking Xarelto.  Consider adding back ASA with history of PE although not fully protective until further evaluation and recommendations by EP.  2. Chronic Diastolic CHF: No evidence of volume overload today.  I reviewed all of his medications with him and written beside each 1 the indication to include his diuretics, heart rate control medication, and blood pressure control.  Would recommend repeat echocardiogram after full evaluation by Dr. Quentin Ore unless completed  prior to [facility].  3.  Hypertension: Currently well-controlled on medication regimen.  No changes  at this time.  4.  Thoracic aortic aneurysm: CT angio on 11/30/2021 revealed a stable 4.9 cm ascending thoracic aneurysm without complicating features.  He should follow-up semiannually.  5.  Nonobstructive coronary artery disease: Continue secondary prevention with increased activity, weight loss, cholesterol management.  6.  Chronic diastolic CHF: Remains on Lasix for diuresis daily, with additional Lasix as needed.  I have reviewed his most recent labs, creatinine 1.05, GFR greater than 60, potassium 3.9 sodium 140.  Current medicines are reviewed at length with the patient today.  I have spent 45 min's  dedicated to the care of this patient on the date of this encounter to include pre-visit review of records, assessment, management and diagnostic testing,with shared decision making. Signed, Phill Myron. West Pugh, ANP, AACC   04/07/2022 10:00 AM      Office [phone] Fax [phone]  Notice: This dictation was prepared with Dragon dictation along with smaller phrase technology. Any transcriptional errors that result from this process are unintentional and may not be corrected upon review.

## 2022-04-03 ENCOUNTER — Encounter (HOSPITAL_BASED_OUTPATIENT_CLINIC_OR_DEPARTMENT_OTHER): Payer: Self-pay

## 2022-04-03 ENCOUNTER — Emergency Department (HOSPITAL_BASED_OUTPATIENT_CLINIC_OR_DEPARTMENT_OTHER)
Admission: EM | Admit: 2022-04-03 | Discharge: 2022-04-04 | Disposition: A | Discharge status: Home / Self Care | Payer: No Typology Code available for payment source | Attending: Emergency Medicine | Admitting: Emergency Medicine

## 2022-04-03 ENCOUNTER — Other Ambulatory Visit: Payer: Self-pay

## 2022-04-03 DIAGNOSIS — N529 Male erectile dysfunction, unspecified: Secondary | ICD-10-CM | POA: Diagnosis not present

## 2022-04-03 DIAGNOSIS — Z8546 Personal history of malignant neoplasm of prostate: Secondary | ICD-10-CM | POA: Diagnosis not present

## 2022-04-03 DIAGNOSIS — Z7901 Long term (current) use of anticoagulants: Secondary | ICD-10-CM | POA: Diagnosis not present

## 2022-04-03 DIAGNOSIS — N3289 Other specified disorders of bladder: Secondary | ICD-10-CM | POA: Diagnosis present

## 2022-04-03 DIAGNOSIS — Z91040 Latex allergy status: Secondary | ICD-10-CM | POA: Insufficient documentation

## 2022-04-03 DIAGNOSIS — R319 Hematuria, unspecified: Secondary | ICD-10-CM | POA: Diagnosis not present

## 2022-04-03 DIAGNOSIS — R310 Gross hematuria: Secondary | ICD-10-CM | POA: Diagnosis not present

## 2022-04-03 MED ORDER — HYDROCODONE-ACETAMINOPHEN 5-325 MG PO TABS
1.0000 | ORAL_TABLET | Freq: Once | ORAL | Status: AC
  Administered 2022-04-04: 1 via ORAL
  Filled 2022-04-03: qty 1

## 2022-04-03 MED ORDER — FENTANYL CITRATE PF 50 MCG/ML IJ SOSY: 50.0000 ug | PREFILLED_SYRINGE | Freq: Once | INTRAMUSCULAR | Status: DC

## 2022-04-03 MED ORDER — FENTANYL CITRATE PF 50 MCG/ML IJ SOSY
50.0000 ug | PREFILLED_SYRINGE | Freq: Once | INTRAMUSCULAR | Status: AC
  Administered 2022-04-03: 50 ug via INTRAVENOUS
  Filled 2022-04-03: qty 1

## 2022-04-03 NOTE — ED Notes (Signed)
This RN attempted to flush the present Catheter with Sterile Water. 250 mL of Water added and 250 mL removed but no clot or visible obstruction noted during flushing.

## 2022-04-03 NOTE — ED Triage Notes (Signed)
Patient here POV from Home.  Endorses having Foley Catheter placed today at 1700 as he has been having Hematuria and passing Clots for 3-4 Weeks. Since then he has had very little Output.   Takes Xarelto.   Very uncomfortable during Triage. A&Ox4. GCS 15. Ambulatory.

## 2022-04-03 NOTE — ED Provider Notes (Signed)
[facility]  Provider Note  CSN: [id number] Arrival date & time: 04/03/22 2105  History Chief Complaint  Patient presents with   Catheter Problem    Carlos Dawson is a 82 y.o. male with history of afib on xarelto, prior prostate cancer, has had intermittent hematuria for the last few weeks. Saw Dr. Rosana Hoes, Urology, earlier today where he had a cystoscopy, irrigation and foley placed. He reports significant pain in his bladder since then. Has not had gross hematuria since catheter but pain has been unbearable. Catheter was flushed without difficulty in triage and bladder scan was negative.   Home Medications Prior to Admission medications   Medication Sig Start Date End Date Taking? Authorizing Provider  oxybutynin (DITROPAN) 5 MG tablet Take 1 tablet (5 mg total) by mouth every 8 (eight) hours as needed for bladder spasms. 04/03/22  Yes Truddie Hidden, MD  acetaminophen (TYLENOL) 500 MG tablet Take 1,000 mg by mouth every 8 (eight) hours as needed for mild pain or headache.     [provider]  albuterol (VENTOLIN HFA) 108 (90 Base) MCG/ACT inhaler Inhale 2 puffs into the lungs every 4 (four) hours as needed for wheezing or shortness of breath. 08/13/20   Chesley Mires, MD  augmented betamethasone dipropionate (DIPROLENE-AF) 0.05 % cream Apply 1 application topically 2 (two) times daily as needed for itching. 01/31/17   [provider]  baclofen (LIORESAL) 10 MG tablet Take 10 mg by mouth 3 (three) times daily as needed. 02/03/22   [provider]  budesonide-formoterol (SYMBICORT) 160-4.5 MCG/ACT inhaler Inhale 2 puffs into the lungs 2 (two) times daily. As needed    [provider]  cefUROXime (CEFTIN) 500 MG tablet Take 1 tablet (500 mg total) by mouth 2 (two) times daily with a meal. 03/24/22   Dorie Rank, MD  Cholecalciferol (VITAMIN D-3) 5000 units TABS Take 5,000 Units by mouth daily.    [provider]  Coenzyme Q10 100 MG capsule Take 100 mg by mouth daily.     [provider]  dextromethorphan-guaiFENesin (MUCINEX DM) 30-600 MG 12hr tablet Take 2 tablets by mouth as needed.    [provider]  diphenoxylate-atropine (LOMOTIL) 2.5-0.025 MG tablet Take 1 tablet by mouth 4 (four) times daily as needed. 03/02/21   [provider]  fluticasone (FLONASE) 50 MCG/ACT nasal spray Place 1 spray into both nostrils as needed for allergies or rhinitis.    [provider]  furosemide (LASIX) 40 MG tablet TAKE ONE TABLET ('40MG'$ ) BY MOUTH EACH DAY AS NEEDED FOR SWELLING OR WEIGHT GAIN. 03/20/22   Martinique, [name], MD  furosemide (LASIX) 80 MG tablet Take 1 tablet (80 mg total) by mouth daily. 01/04/21 01/03/22  Sherran Needs, NP  HYDROcodone-acetaminophen (NORCO/VICODIN) 5-325 MG tablet Take 1 tablet by mouth every 6 (six) hours as needed for severe pain. 04/04/22   Truddie Hidden, MD  JARDIANCE 25 MG TABS tablet Take 1 tablet by mouth daily. 04/18/18   [provider]  losartan (COZAAR) 25 MG tablet TAKE 1 TABLET BY MOUTH EVERY DAY 07/28/20   Martin[place], [name], MD  methocarbamol (ROBAXIN) 500 MG tablet Take 1 tablet (500 mg total) by mouth every 8 (eight) hours as needed for muscle spasms. 10/20/16   Eustace Moore, MD  metoprolol tartrate (LOPRESSOR) 25 MG tablet Take 12.5 mg by mouth 2 (two) times daily.    [provider]  nitroGLYCERIN (NITROSTAT) 0.4 MG  SL tablet Place 1 tablet (0.4 mg total) under the tongue every 5 (five) minutes as needed for chest pain. 09/08/21   Martin[place], [name], MD  pantoprazole (PROTONIX) 40 MG tablet TAKE ONE TABLET BY MOUTH BEFORE BREAKFAST (TAKE ON AN EMPTY STOMACH 30 MINUTES PRIOR TO A MEAL) 03/09/21   [provider]  Polyethyl Glycol-Propyl Glycol (SYSTANE OP) Place 1 drop into both eyes daily as needed (for dry eyes).     [provider]  potassium chloride SA (KLOR-CON M20) 20 MEQ tablet Taking  two tablet by mouth in the am and 1 tablet in the evening 01/04/21   Sherran Needs, NP  psyllium (METAMUCIL) 58.6 % packet Take 1 packet by mouth daily.    [provider]  rivaroxaban (XARELTO) 20 MG TABS tablet Take 1 tablet (20 mg total) by mouth daily with supper. 03/14/17   Martin[place], [name], MD  sildenafil (VIAGRA) 100 MG tablet Take 1 tablet (100 mg total) by mouth as needed for erectile dysfunction. 07/13/20   [place], [name], MD  Skin Protectants, Misc. (EUCERIN) cream Apply 1 application topically as needed for dry skin.    [provider]  spironolactone (ALDACTONE) 25 MG tablet Take 25 mg by mouth daily.    [provider]  tamsulosin (FLOMAX) 0.4 MG CAPS Take 0.4 mg by mouth every evening.     [provider]  topiramate (TOPAMAX) 25 MG capsule Take 50 mg by mouth 2 (two) times daily.     [provider]     Allergies    Other, Lotensin [benazepril], Tofranil [imipramine], Ace inhibitors, Adhesive [tape], Amoxicillin-pot clavulanate, Codeine, Latex, Metformin, Morphine, Nifedipine, and Quinolones   Review of Systems   Review of Systems Please see HPI for pertinent positives and negatives  Physical Exam BP 125/73   Pulse 73   Temp (!) 97.5 F (36.4 C)   Resp 16   Ht '5\' 7"'$  (1.702 m)   Wt 111.1 kg   SpO2 98%   BMI 38.36 kg/m   Physical Exam Vitals and nursing note reviewed.  Constitutional:      Appearance: Normal appearance.  HENT:     Head: Normocephalic and atraumatic.     Nose: Nose normal.     Mouth/Throat:     Mouth: Mucous membranes are moist.  Eyes:     Extraocular Movements: Extraocular movements intact.     Conjunctiva/sclera: Conjunctivae normal.  Cardiovascular:     Rate and Rhythm: Normal rate.  Pulmonary:     Effort: Pulmonary effort is normal.     Breath sounds: Normal breath sounds.  Abdominal:     General: Abdomen is flat.     Palpations: Abdomen is soft.     Tenderness: There is no abdominal  tenderness. There is no guarding.  Musculoskeletal:        General: No swelling. Normal range of motion.     Cervical back: Neck supple.  Skin:    General: Skin is warm and dry.  Neurological:     General: No focal deficit present.     Mental Status: He is alert.  Psychiatric:        Mood and Affect: Mood normal.     ED Results / Procedures / Treatments   EKG None  Procedures Procedures  Medications Ordered in the ED Medications  HYDROcodone-acetaminophen (NORCO/VICODIN) 5-325 MG per tablet 1 tablet (has no administration in time range)  fentaNYL (SUBLIMAZE) injection 50 mcg (50 mcg Intravenous Given 04/03/22  2313)    Initial Impression and Plan  Patient here with bladder pain after cystoscopy and foley placement earlier today with plan for repeat cysto later this week.  Catheter flushes well, no gross hematuria. Likely having bladder spasms. Offered pain control vs removal of foley. He will try pain meds first and reassess for improvement.   ED Course   Clinical Course as of 04/04/22 0012  Mon Apr 03, 2022  2357 Patient reports pain is improved. He continues having some bladder spasms but only after taking a sip of water. Regardless, he would like to go home. Rx for Norco and ditropan (which we do not have in our Pyxis). Follow up with Urology tomorrow.  [CS]    Clinical Course User Index [CS] Truddie Hidden, MD     MDM Rules/Calculators/A&P Medical Decision Making Problems Addressed: Bladder spasms: acute illness or injury  Amount and/or Complexity of Data Reviewed Labs: ordered. Decision-making details documented in ED Course.  Risk Prescription drug management.     Final Clinical Impression(s) / ED Diagnoses Final diagnoses:  Bladder spasms    Rx / DC Orders ED Discharge Orders          Ordered    HYDROcodone-acetaminophen (NORCO/VICODIN) 5-325 MG tablet  Every 6 hours PRN        04/04/22 0007    oxybutynin (DITROPAN) 5 MG tablet  Every 8 hours  PRN        04/04/22 0000    HYDROcodone-acetaminophen (NORCO/VICODIN) 5-325 MG tablet  Every 6 hours PRN,   Status:  Discontinued        04/04/22 0000             Truddie Hidden, MD 04/04/22 [phone]

## 2022-04-03 NOTE — ED Notes (Signed)
Pt bladder scan was 0 ml.

## 2022-04-04 ENCOUNTER — Encounter (HOSPITAL_BASED_OUTPATIENT_CLINIC_OR_DEPARTMENT_OTHER): Payer: Self-pay | Admitting: Emergency Medicine

## 2022-04-04 ENCOUNTER — Emergency Department (HOSPITAL_BASED_OUTPATIENT_CLINIC_OR_DEPARTMENT_OTHER)
Admission: EM | Admit: 2022-04-04 | Discharge: 2022-04-04 | Disposition: A | Discharge status: Home / Self Care | Payer: No Typology Code available for payment source | Attending: Emergency Medicine | Admitting: Emergency Medicine

## 2022-04-04 ENCOUNTER — Other Ambulatory Visit: Payer: Self-pay

## 2022-04-04 DIAGNOSIS — R301 Vesical tenesmus: Secondary | ICD-10-CM

## 2022-04-04 DIAGNOSIS — N3289 Other specified disorders of bladder: Secondary | ICD-10-CM | POA: Diagnosis not present

## 2022-04-04 DIAGNOSIS — Z91040 Latex allergy status: Secondary | ICD-10-CM | POA: Diagnosis not present

## 2022-04-04 DIAGNOSIS — R319 Hematuria, unspecified: Secondary | ICD-10-CM | POA: Diagnosis present

## 2022-04-04 DIAGNOSIS — Z8551 Personal history of malignant neoplasm of bladder: Secondary | ICD-10-CM | POA: Diagnosis not present

## 2022-04-04 DIAGNOSIS — Z7901 Long term (current) use of anticoagulants: Secondary | ICD-10-CM | POA: Diagnosis not present

## 2022-04-04 DIAGNOSIS — R3989 Other symptoms and signs involving the genitourinary system: Secondary | ICD-10-CM

## 2022-04-04 LAB — URINALYSIS, MICROSCOPIC (REFLEX): RBC / HPF: >50 RBC/hpf (ref 0–5)

## 2022-04-04 LAB — URINALYSIS, ROUTINE W REFLEX MICROSCOPIC

## 2022-04-04 MED ORDER — HYDROCODONE-ACETAMINOPHEN 5-325 MG PO TABS: 1.0000 | ORAL_TABLET | Freq: Four times a day (QID) | ORAL | 0 refills | Status: DC | PRN

## 2022-04-04 MED ORDER — OXYBUTYNIN CHLORIDE 5 MG PO TABS: 5.0000 mg | ORAL_TABLET | Freq: Three times a day (TID) | ORAL | 0 refills | Status: DC | PRN

## 2022-04-04 NOTE — ED Notes (Addendum)
Pt's urine bag disconnected and drained without difficulty 340 ml frank, thick blood. Rn flushed without difficulty or resistance with 90 mls of sterile water. Pt output it into fresh leg bag. Pt tolerated well.

## 2022-04-04 NOTE — ED Provider Notes (Signed)
[facility] Provider Note   CSN: [id number] Arrival date & time: 04/04/22  [id number]     History  Chief Complaint  Patient presents with   Hematuria    Carlos Dawson is a 82 y.o. male.  HPI 82 year old male history of bladder cancer, on Xarelto, presents again today after being evaluated last night for pain in his bladder.  He was seen yesterday by his urologist for hematuria.  He had a cystoscopy and had a catheter placed.  He came in last night due to pain in his bladder.  It was felt to be due to bladder spasm.  He was treated here with pain medicine but did not get his triptan filled.  He went home and continue to have severe pain and returns for reevaluation.  He has very little urine in the bag which is mostly blood.  He states that he did empty a very small amount at home in the early morning hours.  He denies any fever, chills, nausea, vomiting, diarrhea, or other bleeding.     Home Medications Prior to Admission medications   Medication Sig Start Date End Date Taking? Authorizing Provider  acetaminophen (TYLENOL) 500 MG tablet Take 1,000 mg by mouth every 8 (eight) hours as needed for mild pain or headache.     [provider]  albuterol (VENTOLIN HFA) 108 (90 Base) MCG/ACT inhaler Inhale 2 puffs into the lungs every 4 (four) hours as needed for wheezing or shortness of breath. 08/13/20   Chesley Mires, MD  augmented betamethasone dipropionate (DIPROLENE-AF) 0.05 % cream Apply 1 application topically 2 (two) times daily as needed for itching. 01/31/17   [provider]  baclofen (LIORESAL) 10 MG tablet Take 10 mg by mouth 3 (three) times daily as needed. 02/03/22   [provider]  budesonide-formoterol (SYMBICORT) 160-4.5 MCG/ACT inhaler Inhale 2 puffs into the lungs 2 (two) times daily. As needed    [provider]  cefUROXime (CEFTIN) 500 MG tablet Take 1 tablet (500 mg total) by mouth 2 (two) times  daily with a meal. 03/24/22   Dorie Rank, MD  Cholecalciferol (VITAMIN D-3) 5000 units TABS Take 5,000 Units by mouth daily.    [provider]  Coenzyme Q10 100 MG capsule Take 100 mg by mouth daily.     [provider]  dextromethorphan-guaiFENesin (MUCINEX DM) 30-600 MG 12hr tablet Take 2 tablets by mouth as needed.    [provider]  diphenoxylate-atropine (LOMOTIL) 2.5-0.025 MG tablet Take 1 tablet by mouth 4 (four) times daily as needed. 03/02/21   [provider]  fluticasone (FLONASE) 50 MCG/ACT nasal spray Place 1 spray into both nostrils as needed for allergies or rhinitis.    [provider]  furosemide (LASIX) 40 MG tablet TAKE ONE TABLET ('40MG'$ ) BY MOUTH EACH DAY AS NEEDED FOR SWELLING OR WEIGHT GAIN. 03/20/22   Martin[place], [name], MD  furosemide (LASIX) 80 MG tablet Take 1 tablet (80 mg total) by mouth daily. 01/04/21 01/03/22  Sherran Needs, NP  HYDROcodone-acetaminophen (NORCO/VICODIN) 5-325 MG tablet Take 1 tablet by mouth every 6 (six) hours as needed for severe pain. 04/04/22   Truddie Hidden, MD  JARDIANCE 25 MG TABS tablet Take 1 tablet by mouth daily. 04/18/18   [provider]  losartan (COZAAR) 25 MG tablet TAKE 1 TABLET BY MOUTH EVERY DAY 07/28/20   [place], [name], MD  methocarbamol (ROBAXIN) 500 MG tablet Take 1 tablet (500 mg  total) by mouth every 8 (eight) hours as needed for muscle spasms. 10/20/16   Eustace Moore, MD  metoprolol tartrate (LOPRESSOR) 25 MG tablet Take 12.5 mg by mouth 2 (two) times daily.    [provider]  nitroGLYCERIN (NITROSTAT) 0.4 MG SL tablet Place 1 tablet (0.4 mg total) under the tongue every 5 (five) minutes as needed for chest pain. 09/08/21   [place], [name], MD  oxybutynin (DITROPAN) 5 MG tablet Take 1 tablet (5 mg total) by mouth every 8 (eight) hours as needed for bladder spasms. 04/03/22   Truddie Hidden, MD  pantoprazole (PROTONIX) 40 MG tablet TAKE ONE TABLET BY MOUTH BEFORE  BREAKFAST (TAKE ON AN EMPTY STOMACH 30 MINUTES PRIOR TO A MEAL) 03/09/21   [provider]  Polyethyl Glycol-Propyl Glycol (SYSTANE OP) Place 1 drop into both eyes daily as needed (for dry eyes).     [provider]  potassium chloride SA (KLOR-CON M20) 20 MEQ tablet Taking two tablet by mouth in the am and 1 tablet in the evening 01/04/21   Sherran Needs, NP  psyllium (METAMUCIL) 58.6 % packet Take 1 packet by mouth daily.    [provider]  rivaroxaban (XARELTO) 20 MG TABS tablet Take 1 tablet (20 mg total) by mouth daily with supper. 03/14/17   Martin[place], [name], MD  sildenafil (VIAGRA) 100 MG tablet Take 1 tablet (100 mg total) by mouth as needed for erectile dysfunction. 07/13/20   [place], [name], MD  Skin Protectants, Misc. (EUCERIN) cream Apply 1 application topically as needed for dry skin.    [provider]  spironolactone (ALDACTONE) 25 MG tablet Take 25 mg by mouth daily.    [provider]  tamsulosin (FLOMAX) 0.4 MG CAPS Take 0.4 mg by mouth every evening.     [provider]  topiramate (TOPAMAX) 25 MG capsule Take 50 mg by mouth 2 (two) times daily.     [provider]      Allergies    Other, Lotensin [benazepril], Tofranil [imipramine], Ace inhibitors, Adhesive [tape], Amoxicillin-pot clavulanate, Codeine, Latex, Metformin, Morphine, Nifedipine, and Quinolones    Review of Systems   Review of Systems  Physical Exam Updated Vital Signs BP 131/73 (BP Location: Right Arm)   Pulse 80   Temp 99.2 F (37.3 C)   Resp 18   SpO2 96%  Physical Exam Vitals and nursing note reviewed.  Constitutional:      General: He is in acute distress.  HENT:     Head: Normocephalic.     Right Ear: External ear normal.     Left Ear: External ear normal.     Nose: Nose normal.     Mouth/Throat:     Mouth: Mucous membranes are dry.     Pharynx: Oropharynx is clear.  Eyes:     Pupils: Pupils are equal, round, and reactive to  light.  Cardiovascular:     Rate and Rhythm: Normal rate and regular rhythm.     Pulses: Normal pulses.  Pulmonary:     Effort: Pulmonary effort is normal.  Abdominal:     General: Abdomen is flat.  Genitourinary:    Comments: Foley catheter in place with little bloody urine at Musculoskeletal:        General: Normal range of motion.     Cervical back: Normal range of motion.  Skin:    General: Skin is warm.  Neurological:     General: No focal deficit present.  Mental Status: He is alert.  Psychiatric:        Mood and Affect: Mood normal.     ED Results / Procedures / Treatments   Labs (all labs ordered are listed, but only abnormal results are displayed) Labs Reviewed - No data to display  EKG None  Radiology No results found.  Procedures Procedures    Medications Ordered in ED Medications - No data to display  ED Course/ Medical Decision Making/ A&P                             Medical Decision Making Amount and/or Complexity of Data Reviewed Labs: ordered.   Patient presents with severe pain in suprapubic area after hematuria and bladder irrigation yesterday with Foley placed.  He has little urine in his Foley.  I suspect some component of retention.  Plan bladder scan and probable irrigation and will reevaluate. Reviewed her labs from yesterday to include urinalysis that was grossly bloody and had greater than 50 red blood cells but only 11-20 white blood cells and 6-10 squamous epithelial cells so likely a contaminated study. Today, patient had some urine output when bag was removed without irrigation and then was irrigated till clear urine.  Patient requested that Foley be removed.  Foley catheter was removed and patient symptoms have resolved. I have discussed the patient that his eighth he should drink plenty of liquids and if he is unable to urinate by 12 he should call his urologist.  We have also discussed the return precautions and need for  follow-up and he voiced understanding.       Final Clinical Impression(s) / ED Diagnoses Final diagnoses:  Painful bladder spasm  Hematuria, unspecified type    Rx / DC Orders ED Discharge Orders     None         Pattricia Boss, MD 04/04/22 [phone]

## 2022-04-04 NOTE — Discharge Instructions (Signed)
Please drink plenty of fluids. If you are unable to urinate by noon, please call your urologist. Please return if you are having any new or worsening symptoms

## 2022-04-04 NOTE — ED Triage Notes (Signed)
Pt called EMS for pain in bladder. Cytoscopy done yesterday and flushed.

## 2022-04-04 NOTE — ED Notes (Signed)
Per EDP verbal, foley discontinued. Rn discussed risk that he may retain and have to get placed again and he voiced understanding. Pt tolerated well. Given urinal and encouraged to urinate if feels urge.

## 2022-04-04 NOTE — ED Notes (Signed)
Reviewed AVS/discharge instruction with patient. Time allotted for and all questions answered. Patient is agreeable for d/c and escorted to ed exit by staff.  

## 2022-04-05 DIAGNOSIS — N183 Chronic kidney disease, stage 3 unspecified: Secondary | ICD-10-CM | POA: Diagnosis not present

## 2022-04-05 DIAGNOSIS — I5030 Unspecified diastolic (congestive) heart failure: Secondary | ICD-10-CM | POA: Diagnosis not present

## 2022-04-05 DIAGNOSIS — I480 Paroxysmal atrial fibrillation: Secondary | ICD-10-CM | POA: Diagnosis not present

## 2022-04-05 DIAGNOSIS — E785 Hyperlipidemia, unspecified: Secondary | ICD-10-CM | POA: Diagnosis not present

## 2022-04-05 DIAGNOSIS — J4530 Mild persistent asthma, uncomplicated: Secondary | ICD-10-CM | POA: Diagnosis not present

## 2022-04-05 DIAGNOSIS — N400 Enlarged prostate without lower urinary tract symptoms: Secondary | ICD-10-CM | POA: Diagnosis not present

## 2022-04-05 DIAGNOSIS — K219 Gastro-esophageal reflux disease without esophagitis: Secondary | ICD-10-CM | POA: Diagnosis not present

## 2022-04-05 DIAGNOSIS — I10 Essential (primary) hypertension: Secondary | ICD-10-CM | POA: Diagnosis not present

## 2022-04-06 DIAGNOSIS — N3289 Other specified disorders of bladder: Secondary | ICD-10-CM | POA: Diagnosis not present

## 2022-04-06 DIAGNOSIS — N3011 Interstitial cystitis (chronic) with hematuria: Secondary | ICD-10-CM | POA: Diagnosis not present

## 2022-04-06 DIAGNOSIS — Z8546 Personal history of malignant neoplasm of prostate: Secondary | ICD-10-CM | POA: Diagnosis not present

## 2022-04-06 DIAGNOSIS — Z08 Encounter for follow-up examination after completed treatment for malignant neoplasm: Secondary | ICD-10-CM | POA: Diagnosis not present

## 2022-04-06 DIAGNOSIS — N529 Male erectile dysfunction, unspecified: Secondary | ICD-10-CM | POA: Diagnosis not present

## 2022-04-07 ENCOUNTER — Encounter: Payer: Self-pay | Admitting: Adult Health

## 2022-04-07 ENCOUNTER — Ambulatory Visit: Payer: Medicare Other | Attending: Adult Health | Admitting: Adult Health

## 2022-04-07 VITALS — BP 132/78 | HR 73 | Ht 67.0 in | Wt 253.2 lb

## 2022-04-07 DIAGNOSIS — I480 Paroxysmal atrial fibrillation: Secondary | ICD-10-CM

## 2022-04-07 DIAGNOSIS — I5032 Chronic diastolic (congestive) heart failure: Secondary | ICD-10-CM | POA: Diagnosis not present

## 2022-04-07 DIAGNOSIS — I2510 Atherosclerotic heart disease of native coronary artery without angina pectoris: Secondary | ICD-10-CM

## 2022-04-07 DIAGNOSIS — I10 Essential (primary) hypertension: Secondary | ICD-10-CM | POA: Insufficient documentation

## 2022-04-07 DIAGNOSIS — I7121 Aneurysm of the ascending aorta, without rupture: Secondary | ICD-10-CM | POA: Diagnosis not present

## 2022-04-07 MED ORDER — SPIRONOLACTONE 25 MG PO TABS: 25.0000 mg | ORAL_TABLET | Freq: Every day | ORAL | 3 refills | Status: DC

## 2022-04-07 NOTE — Patient Instructions (Signed)
Medication Instructions:  Stop Xarelto *If you need a refill on your cardiac medications before your next appointment, please call your pharmacy*   Lab Work: No Labs If you have labs (blood work) drawn today and your tests are completely normal, you will receive your results only by: [facility] (if you have MyChart) OR A paper copy in the mail If you have any lab test that is abnormal or we need to change your treatment, we will call you to review the results.   Testing/Procedures: No Testing   Follow-Up: At [facility], you and your health needs are our priority.  As part of our continuing mission to provide you with exceptional heart care, we have created designated Provider Care Teams.  These Care Teams include your primary Cardiologist (physician) and Advanced Practice Providers (APPs -  Physician Assistants and Nurse Practitioners) who all work together to provide you with the care you need, when you need it.  We recommend signing up for the patient portal called "MyChart".  Sign up information is provided on this After Visit Summary.  MyChart is used to connect with patients for Virtual Visits (Telemedicine).  Patients are able to view lab/test results, encounter notes, upcoming appointments, etc.  Non-urgent messages can be sent to your provider as well.   To learn more about what you can do with MyChart, go to [email or web address].    Your next appointment:   First Available  Provider:   Lars Mage, MD

## 2022-04-10 ENCOUNTER — Telehealth: Payer: Self-pay

## 2022-04-10 NOTE — Telephone Encounter (Signed)
Scheduled the patient for visit with Dr. Quentin Ore to discuss [facility] 05/10/2022. He agreed to visit in [place] as he could be seen sooner there than [place]. He was grateful for call and agreed with plan.

## 2022-04-17 ENCOUNTER — Telehealth: Payer: Self-pay

## 2022-04-17 NOTE — Telephone Encounter (Signed)
[facility] for April 2019 cCT:

## 2022-04-20 ENCOUNTER — Telehealth: Payer: Self-pay

## 2022-04-20 NOTE — Telephone Encounter (Addendum)
Called patient regarding results. Patient had understanding of results.----- Message from Lendon Colonel, NP sent at 03/31/2022  5:25 PM EST -----  I have reviewed echocardiogram results.  He has a normal heart pumping function, with some stiffening on relaxation which was similar to prior echocardiogram.  The aneurysm of his ascending aorta has improved in size slightly, compared to previous echocardiogram.  Will continue to monitor this annually.  He is to call us if he feels any chest discomfort or ripping pain inside his chest or abdomen.  No changes in his medication regimen.  KL

## 2022-04-26 DIAGNOSIS — B353 Tinea pedis: Secondary | ICD-10-CM | POA: Diagnosis not present

## 2022-05-02 DIAGNOSIS — M963 Postlaminectomy kyphosis: Secondary | ICD-10-CM | POA: Diagnosis not present

## 2022-05-02 DIAGNOSIS — M4804 Spinal stenosis, thoracic region: Secondary | ICD-10-CM | POA: Diagnosis not present

## 2022-05-02 DIAGNOSIS — M4726 Other spondylosis with radiculopathy, lumbar region: Secondary | ICD-10-CM | POA: Diagnosis not present

## 2022-05-02 DIAGNOSIS — M4714 Other spondylosis with myelopathy, thoracic region: Secondary | ICD-10-CM | POA: Diagnosis not present

## 2022-05-02 DIAGNOSIS — M5416 Radiculopathy, lumbar region: Secondary | ICD-10-CM | POA: Diagnosis not present

## 2022-05-09 DIAGNOSIS — D6869 Other thrombophilia: Secondary | ICD-10-CM | POA: Diagnosis not present

## 2022-05-09 DIAGNOSIS — I5030 Unspecified diastolic (congestive) heart failure: Secondary | ICD-10-CM | POA: Diagnosis not present

## 2022-05-09 DIAGNOSIS — G8929 Other chronic pain: Secondary | ICD-10-CM | POA: Diagnosis not present

## 2022-05-09 DIAGNOSIS — N183 Chronic kidney disease, stage 3 unspecified: Secondary | ICD-10-CM | POA: Diagnosis not present

## 2022-05-09 DIAGNOSIS — E1169 Type 2 diabetes mellitus with other specified complication: Secondary | ICD-10-CM | POA: Diagnosis not present

## 2022-05-09 DIAGNOSIS — M545 Low back pain, unspecified: Secondary | ICD-10-CM | POA: Diagnosis not present

## 2022-05-09 DIAGNOSIS — I480 Paroxysmal atrial fibrillation: Secondary | ICD-10-CM | POA: Diagnosis not present

## 2022-05-09 DIAGNOSIS — D649 Anemia, unspecified: Secondary | ICD-10-CM | POA: Diagnosis not present

## 2022-05-09 DIAGNOSIS — I10 Essential (primary) hypertension: Secondary | ICD-10-CM | POA: Diagnosis not present

## 2022-05-09 DIAGNOSIS — I2510 Atherosclerotic heart disease of native coronary artery without angina pectoris: Secondary | ICD-10-CM | POA: Diagnosis not present

## 2022-05-09 NOTE — Progress Notes (Unsigned)
Electrophysiology Office Follow up Visit Note:    Date:  05/10/2022   ID:  Carlos Dawson, DOB 1940/07/03, MRN [id number]  PCP:  Thana Ates, MD  [facility] Cardiologist:  Peter [name], MD  [facility] Electrophysiologist:  Lanier Prude, MD    Interval History:    Carlos Dawson is a 82 y.o. male who presents for a follow up visit.   I last saw the patient August 18, 2021 for watchman evaluation.  He has paroxysmal atrial fibrillation and had a prior ablation with Dr. Johney Frame May 11, 2017.  He was on Xarelto.  He was using chronic NSAIDs and he was concerned about the risk of bleeding.  He wanted some time to consider his options before finalizing a treatment plan.  Today he presents to discuss watchman implant again.  He has had another episode of hematuria due to radiation cystitis.  He has been off his anticoagulant for 1 month and is concerned about his risk of stroke being off his anticoagulant given his history of atrial fibrillation.     Past medical, surgical, social and family history were reviewed.  ROS:   Please see the history of present illness.    All other systems reviewed and are negative.  EKGs/Labs/Other Studies Reviewed:    The following studies were reviewed today:     Physical Exam:    VS:  BP 106/64   Pulse 60   Ht  (1.702 m)   Wt 249 lb (112.9 kg)   SpO2 98%   BMI 39.00 kg/m     Wt Readings from Last 3 Encounters:  05/10/22 249 lb (112.9 kg)  04/07/22 253 lb 3.2 oz (114.9 kg)  04/03/22 244 lb 14.9 oz (111.1 kg)     GEN:  Well nourished, well developed in no acute distress CARDIAC: RRR, no murmurs, rubs, gallops RESPIRATORY:  Clear to auscultation without rales, wheezing or rhonchi       ASSESSMENT:    1. Paroxysmal atrial fibrillation   2. Primary hypertension    PLAN:    In order of problems listed above:  #Paroxysmal atrial fibrillation Has been off his anticoagulant for 1 month and wishes to have a  stroke risk mitigation strategy that avoids long-term exposure anticoagulation.  I discussed watchman again with the patient and his family and they are interested in proceeding.   I have seen [name] in the office today who is being considered for a Watchman left atrial appendage closure device. I believe they will benefit from this procedure given their history of atrial fibrillation, CHA2DS2-VASc score of 4 and unadjusted ischemic stroke rate of 4.8% per year. Unfortunately, the patient is not felt to be a long term anticoagulation candidate secondary to a desire for chronic use of NSAIDs. The patient's chart has been reviewed and I feel that they would be a candidate for short term oral anticoagulation after Watchman implant.    It is my belief that after undergoing a LAA closure procedure, Carlos Dawson will not need long term anticoagulation which eliminates anticoagulation side effects and major bleeding risk.    Procedural risks for the Watchman implant have been reviewed with the patient including a 0.5% risk of stroke, <1% risk of perforation and <1% risk of device embolization. Other risks include bleeding, vascular damage, tamponade, worsening renal function, and death. The patient understands these risks.       The published clinical data on the safety and effectiveness of  WATCHMAN include but are not limited to the following: - Holmes DR, Everlene Farrier, Sick P et al. for the PROTECT AF Investigators. Percutaneous closure of the left atrial appendage versus warfarin therapy for prevention of stroke in patients with atrial fibrillation: a randomised non-inferiority trial. Lancet 2009; 374: 534-42. Everlene Farrier, Doshi SK, Isa Rankin D et al. on behalf of the PROTECT AF Investigators. Percutaneous Left Atrial Appendage Closure for Stroke Prophylaxis in Patients With Atrial Fibrillation 2.3-Year Follow-up of the PROTECT AF (Watchman Left Atrial Appendage System for Embolic Protection  in Patients With Atrial Fibrillation) Trial. Circulation 2013; 127:720-729. - Alli O, Doshi S,  Kar S, Reddy VY, Sievert H et al. Quality of Life Assessment in the Randomized PROTECT AF (Percutaneous Closure of the Left Atrial Appendage Versus Warfarin Therapy for Prevention of Stroke in Patients With Atrial Fibrillation) Trial of Patients at Risk for Stroke With Nonvalvular Atrial Fibrillation. J Am Coll Cardiol 2013; 61:1790-8. Aline August DR, Mia Creek, Price M, Whisenant B, Sievert H, Doshi S, Huber K, Reddy V. Prospective randomized evaluation of the Watchman left atrial appendage Device in patients with atrial fibrillation versus long-term warfarin therapy; the PREVAIL trial. Journal of the [employer] of Cardiology, Vol. 4, No. 1, 2014, 1-11. - Kar S, Doshi SK, Sadhu A, Horton R, Osorio J et al. Primary outcome evaluation of a next-generation left atrial appendage closure device: results from the PINNACLE FLX trial. Circulation 2021;143(18)1754-1762.      After today's visit with the patient which was dedicated solely for shared decision making visit regarding LAA closure device, the patient decided to think about his options before finalizing a plan about left atrial appendage occlusion.  If he elects to proceed, I would like to obtain a gated CT scan of the chest with contrast timed for PV/LA visualization.    HAS-BLED score 4 Hypertension Yes  Abnormal renal and liver function (Dialysis, transplant, Cr >2.26 mg/dL /Cirrhosis or Bilirubin >2x Normal or AST/ALT/AP >3x Normal) No  Stroke No  Bleeding Yes  Labile INR (Unstable/high INR) No  Elderly (>65) Yes  Drugs or alcohol (? 8 drinks/week, anti-plt or NSAID) Yes    CHA2DS2-VASc Score = 4  The patient's score is based upon: CHF History: 1 HTN History: 1 Diabetes History: 0 Stroke History: 0 Vascular Disease History: 0 Age Score: 2 Gender Score: 0   #Hypertension Below goal today.  Continue current medication regimen.    Plan to  start Plavix 75 mg by mouth once daily 1 week prior to the watchman implant date.  After implant we will load him with aspirin and he will continue DAPT for 6 months after implant.      Signed, Steffanie Dunn, MD, [facility], [facility] 05/10/2022 10:42 AM    Electrophysiology  [facility]

## 2022-05-10 ENCOUNTER — Ambulatory Visit: Payer: Medicare Other | Attending: Cardiology | Admitting: Cardiology

## 2022-05-10 ENCOUNTER — Encounter: Payer: Self-pay | Admitting: Cardiology

## 2022-05-10 VITALS — BP 106/64 | HR 60 | Ht 67.0 in | Wt 249.0 lb

## 2022-05-10 DIAGNOSIS — M4325 Fusion of spine, thoracolumbar region: Secondary | ICD-10-CM | POA: Diagnosis not present

## 2022-05-10 DIAGNOSIS — M6281 Muscle weakness (generalized): Secondary | ICD-10-CM | POA: Diagnosis not present

## 2022-05-10 DIAGNOSIS — I10 Essential (primary) hypertension: Secondary | ICD-10-CM | POA: Insufficient documentation

## 2022-05-10 DIAGNOSIS — I480 Paroxysmal atrial fibrillation: Secondary | ICD-10-CM | POA: Diagnosis not present

## 2022-05-10 DIAGNOSIS — R262 Difficulty in walking, not elsewhere classified: Secondary | ICD-10-CM | POA: Diagnosis not present

## 2022-05-10 MED ORDER — CLOPIDOGREL BISULFATE 75 MG PO TABS: 75.0000 mg | ORAL_TABLET | Freq: Every day | ORAL | 3 refills | Status: DC

## 2022-05-10 NOTE — Patient Instructions (Addendum)
Medication Instructions:  Your physician recommends that you continue on your current medications as directed. Please refer to the Current Medication list given to you today. Plavix 75 mg, by mouth once daily.  Pt should be start medication 1 week before Watchman procedure.    Plavix 75 mg, sent to Pt pharmacy.    Lab Work: None ordered.  If you have labs (blood work) drawn today and your tests are completely normal, you will receive your results only by: MyChart Message (if you have MyChart) OR A paper copy in the mail If you have any lab test that is abnormal or we need to change your treatment, we will call you to review the results.  Testing/Procedures: None ordered.  Follow-Up: Dr. Steffanie Dunn has ordered a Watchman procedure to be scheduled.  No CT scan is required per Dr. Lalla Brothers.  Message will be sent to Ms. Karsten Fells.  Dr. Lalla Brothers has ordered Plavix 75 mg, by mouth, daily; To be started 1 week before the scheduled procedure.    The format for your next appointment:   In Person  Provider:   Steffanie Dunn, MD{or one of the following Advanced Practice Providers on your designated Care Team:

## 2022-05-17 DIAGNOSIS — R262 Difficulty in walking, not elsewhere classified: Secondary | ICD-10-CM | POA: Diagnosis not present

## 2022-05-17 DIAGNOSIS — M4325 Fusion of spine, thoracolumbar region: Secondary | ICD-10-CM | POA: Diagnosis not present

## 2022-05-17 DIAGNOSIS — M6281 Muscle weakness (generalized): Secondary | ICD-10-CM | POA: Diagnosis not present

## 2022-05-19 DIAGNOSIS — R262 Difficulty in walking, not elsewhere classified: Secondary | ICD-10-CM | POA: Diagnosis not present

## 2022-05-19 DIAGNOSIS — M4325 Fusion of spine, thoracolumbar region: Secondary | ICD-10-CM | POA: Diagnosis not present

## 2022-05-19 DIAGNOSIS — M6281 Muscle weakness (generalized): Secondary | ICD-10-CM | POA: Diagnosis not present

## 2022-05-22 DIAGNOSIS — R262 Difficulty in walking, not elsewhere classified: Secondary | ICD-10-CM | POA: Diagnosis not present

## 2022-05-22 DIAGNOSIS — D649 Anemia, unspecified: Secondary | ICD-10-CM | POA: Diagnosis not present

## 2022-05-22 DIAGNOSIS — M6281 Muscle weakness (generalized): Secondary | ICD-10-CM | POA: Diagnosis not present

## 2022-05-22 DIAGNOSIS — M4325 Fusion of spine, thoracolumbar region: Secondary | ICD-10-CM | POA: Diagnosis not present

## 2022-05-23 ENCOUNTER — Other Ambulatory Visit: Payer: Self-pay

## 2022-05-23 DIAGNOSIS — I480 Paroxysmal atrial fibrillation: Secondary | ICD-10-CM

## 2022-05-23 NOTE — Telephone Encounter (Signed)
Lanier Prude, MD  Henrietta Dine, RN No need for repeat CT before the procedure. Thanks! Sheria Lang

## 2022-05-23 NOTE — Telephone Encounter (Signed)
The patient called and stated he wishes to proceed with LAAO on 08/03/2022. Scheduled him for pre-procedure visit 07/19/2022. He was grateful for call and agreed with plan.

## 2022-05-25 DIAGNOSIS — M4325 Fusion of spine, thoracolumbar region: Secondary | ICD-10-CM | POA: Diagnosis not present

## 2022-05-25 DIAGNOSIS — M6281 Muscle weakness (generalized): Secondary | ICD-10-CM | POA: Diagnosis not present

## 2022-05-25 DIAGNOSIS — R262 Difficulty in walking, not elsewhere classified: Secondary | ICD-10-CM | POA: Diagnosis not present

## 2022-05-30 DIAGNOSIS — M4325 Fusion of spine, thoracolumbar region: Secondary | ICD-10-CM | POA: Diagnosis not present

## 2022-05-30 DIAGNOSIS — M6281 Muscle weakness (generalized): Secondary | ICD-10-CM | POA: Diagnosis not present

## 2022-05-30 DIAGNOSIS — R262 Difficulty in walking, not elsewhere classified: Secondary | ICD-10-CM | POA: Diagnosis not present

## 2022-06-01 DIAGNOSIS — M4325 Fusion of spine, thoracolumbar region: Secondary | ICD-10-CM | POA: Diagnosis not present

## 2022-06-01 DIAGNOSIS — R262 Difficulty in walking, not elsewhere classified: Secondary | ICD-10-CM | POA: Diagnosis not present

## 2022-06-01 DIAGNOSIS — M6281 Muscle weakness (generalized): Secondary | ICD-10-CM | POA: Diagnosis not present

## 2022-06-05 DIAGNOSIS — R262 Difficulty in walking, not elsewhere classified: Secondary | ICD-10-CM | POA: Diagnosis not present

## 2022-06-05 DIAGNOSIS — M4325 Fusion of spine, thoracolumbar region: Secondary | ICD-10-CM | POA: Diagnosis not present

## 2022-06-05 DIAGNOSIS — M6281 Muscle weakness (generalized): Secondary | ICD-10-CM | POA: Diagnosis not present

## 2022-06-07 DIAGNOSIS — E1169 Type 2 diabetes mellitus with other specified complication: Secondary | ICD-10-CM | POA: Diagnosis not present

## 2022-06-07 DIAGNOSIS — N400 Enlarged prostate without lower urinary tract symptoms: Secondary | ICD-10-CM | POA: Diagnosis not present

## 2022-06-07 DIAGNOSIS — I480 Paroxysmal atrial fibrillation: Secondary | ICD-10-CM | POA: Diagnosis not present

## 2022-06-07 DIAGNOSIS — K219 Gastro-esophageal reflux disease without esophagitis: Secondary | ICD-10-CM | POA: Diagnosis not present

## 2022-06-07 DIAGNOSIS — I10 Essential (primary) hypertension: Secondary | ICD-10-CM | POA: Diagnosis not present

## 2022-06-07 DIAGNOSIS — N183 Chronic kidney disease, stage 3 unspecified: Secondary | ICD-10-CM | POA: Diagnosis not present

## 2022-06-07 DIAGNOSIS — R262 Difficulty in walking, not elsewhere classified: Secondary | ICD-10-CM | POA: Diagnosis not present

## 2022-06-07 DIAGNOSIS — I5030 Unspecified diastolic (congestive) heart failure: Secondary | ICD-10-CM | POA: Diagnosis not present

## 2022-06-07 DIAGNOSIS — M4325 Fusion of spine, thoracolumbar region: Secondary | ICD-10-CM | POA: Diagnosis not present

## 2022-06-07 DIAGNOSIS — M6281 Muscle weakness (generalized): Secondary | ICD-10-CM | POA: Diagnosis not present

## 2022-06-08 MED ORDER — CLOPIDOGREL BISULFATE 75 MG PO TABS: 75.0000 mg | ORAL_TABLET | Freq: Every day | ORAL | 1 refills | Status: AC

## 2022-06-08 NOTE — Progress Notes (Signed)
[facility]                                     Cardiology Office Note:    Date:  06/13/2022   ID:  Carlos Dawson, DOB 1940/02/12, MRN [id number]  PCP:  Thana Ates, MD  [facility] Cardiologist:  Peter [name], MD  [facility] Electrophysiologist:  Lanier Prude, MD   Referring MD: Thana Ates, MD   Chief Complaint  Patient presents with   Follow-up    Pre LAAO    History of Present Illness:    Carlos Dawson is a 82 y.o. male with a hx of CAD s/p BMS to 2nd OM 2012, chronic diastolic CHF, prior PE, HTN, HLD, DM2, thoracic aneurysm, and PAF who was referred for LAAO closure secondary to hematuria.   Mr. Woodhams has been followed by Dr. [name] for his cardiology care. He underwent AF ablation with Dr. Johney Frame in 2019 and has since been having issue with hematuria with chronic anticoagulation. He was recently hospitalized 03/24/22 with recurrent hematuria after being seen by urology with foley cathter placement. It was recommended that he stop his Xarelto at that time. Given this, he was referred to Dr. Lalla Brothers for LAAO closure. In consultation, he was felt to be a good candidate and plan with to use CT performed prior to AF ablation. He will start Plavix 75mg  QD one week prior to procedure. We will plan to load the patient with ASA 325mg  then he will continue DAPT with ASA and Plavix for 6 month duration.   Today he is here with his wife and reports that he has been doing well with the exception of chronic back pain. He has been tolerating Plavix with no bleeding issues. Denies chest pain, palpitations, LE edema, SOB, orthopnea, dizziness, or syncope.    Past Medical History:  Diagnosis Date   Anticoagulant long-term use    Aortic root enlargement (HCC)    Ascending aortic aneurysm (HCC)    recent scan in October 2012 showing no change; followed by Dr. [name]   ASCVD (arteriosclerotic cardiovascular disease)    Prior BMS to the 2nd OM in September 2012;  with repeat cath in October showing patency   CAD (coronary artery disease)    a. s/p BMS to 2nd OM in Sept 2012; b. LexiScan Myoview (12/2012):  Inf infarct; bowel and motion artifact make study difficult to interpret; no ischemia; not gated; Low Risk   CHF (congestive heart failure) (HCC)    no recent issues 10/13/14   Chronic back pain    "all over my back" (05/11/2017)   Colonic polyp    Contact lens/glasses fitting    Diastolic dysfunction    DVT (deep venous thrombosis) (HCC)    ?LLE   Frequent headaches    "probably weekly" (05/11/2017)   Generalized headaches    neck stenosis   GERD (gastroesophageal reflux disease)    Hearing loss    Hearing loss    more so on left   Hemorrhoids    History of stomach ulcers    Hypertension    IBS (irritable bowel syndrome)    LVH (left ventricular hypertrophy)    Mild intermittent asthma    OA (osteoarthritis)    "all over" (05/11/2017)   Obesities, morbid (HCC)    OSA (obstructive sleep apnea)    PSG 03/30/97 AHI 21, BPAP  13/9   OSA on CPAP    PAF (paroxysmal atrial fibrillation) (HCC)    a. on Xarelto b. s/p DCCV in 08/2016; b. Tikosyn failed 04/16/17 with plans for Multaq and possible Afib ablation with Dr. Johney Frame   Pneumonia    'several times" (05/11/2017)   Prostate CA [facility])    Oncologist  DR. [facility]qu[facility] 09/24/14, undetermined tx   prostate; S/P "radiation and hormone injections"   Pulmonary embolism (HCC) 2008   "both lungs"   SOB (shortness of breath)    on excertion   Thoracic aortic aneurysm (HCC)    Aortic Size Index=     5.0    /Body surface area is 2.43 meters squared. = 2.05  < 2.75 cm/m2      4% risk per year 2.75 to 4.25          8% risk per year > 4.25 cm/m2    20% risk per year   Stable aneurysmal dilation of the ascending aorta with maximum AP diameter of 4.8 cm. Stable area of narrowing of the proximal most portion of the descending aorta measuring 2 cm., previously identified as an area of coarctation. No  evidence of aortic dissection.  Coronary artery disease.  Normal appearance of the lungs.   Electronically Signed   By: Ted Mcalpine M.D.   On: 10/01/2014 08:50     Type II diabetes mellitus (HCC)    metphormin, average 154 dx 2017    Past Surgical History:  Procedure Laterality Date   ACHILLES TENDON REPAIR Bilateral    AORTIC ARCH ANGIOGRAPHY N/A 03/13/2017   Procedure: AORTIC ARCH ANGIOGRAPHY;  Surgeon: [name], [name], MD;  Location: [facility] INVASIVE CV LAB;  Service: Cardiovascular;  Laterality: N/A;   APPENDECTOMY     ATRIAL FIBRILLATION ABLATION  05/11/2017   ATRIAL FIBRILLATION ABLATION N/A 05/11/2017   Procedure: ATRIAL FIBRILLATION ABLATION;  Surgeon: Hillis Range, MD;  Location: [facility] INVASIVE CV LAB;  Service: Cardiovascular;  Laterality: N/A;   BACK SURGERY     "I've had 7 back and 1 neck ORs" (05/11/2017)   BIOPSY  03/14/2018   Procedure: BIOPSY;  Surgeon: Kerin Salen, MD;  Location: [facility] ENDOSCOPY;  Service: Gastroenterology;;  EGD and Colon   CARDIAC CATHETERIZATION  2006   CARPAL TUNNEL RELEASE Bilateral    LEFT   CATARACT EXTRACTION W/ INTRAOCULAR LENS  IMPLANT, BILATERAL Bilateral    CERVICAL SPINE SURGERY  06/02/2010   lower back and neck   COLONOSCOPY N/A 03/14/2018   Procedure: COLONOSCOPY;  Surgeon: Kerin Salen, MD;  Location: [facility] ENDOSCOPY;  Service: Gastroenterology;  Laterality: N/A;   COLONOSCOPY WITH PROPOFOL N/A 12/29/2014   Procedure: COLONOSCOPY WITH PROPOFOL;  Surgeon: Charolett Bumpers, MD;  Location: [facility] ENDOSCOPY;  Service: Endoscopy;  Laterality: N/A;   CORONARY ANGIOPLASTY WITH STENT PLACEMENT  October 2012   CORONARY STENT PLACEMENT  Sept 2012   2nd OM with BMS   ESOPHAGOGASTRODUODENOSCOPY N/A 03/14/2018   Procedure: ESOPHAGOGASTRODUODENOSCOPY (EGD);  Surgeon: Kerin Salen, MD;  Location: [facility];  Service: Gastroenterology;  Laterality: N/A;   HEMORROIDECTOMY     LAMINECTOMY  05/30/2012   L 4 L5   LAMINECTOMY WITH POSTERIOR LATERAL ARTHRODESIS LEVEL 3  N/A 10/18/2016   Procedure: Posterior Lateral Fusion - Lumbar One-Four, segmental instrumentation Lumbar One-Five,  decompression,;  Surgeon: Tia Alert, MD;  Location: [facility] OR;  Service: Neurosurgery;  Laterality: N/A;   LAPAROSCOPIC CHOLECYSTECTOMY     LAPAROSCOPIC GASTRIC BANDING     LEFT  AND RIGHT HEART CATHETERIZATION WITH CORONARY ANGIOGRAM N/A 05/07/2014   Procedure: LEFT AND RIGHT HEART CATHETERIZATION WITH CORONARY ANGIOGRAM;  Surgeon: [name] [name], MD;  Location: [facility] CATH LAB;  Service: Cardiovascular;  Laterality: N/A;   LEFT HEART CATH AND CORONARY ANGIOGRAPHY N/A 03/13/2017   Procedure: LEFT HEART CATH AND CORONARY ANGIOGRAPHY;  Surgeon: [name], [name], MD;  Location: [facility] INVASIVE CV LAB;  Service: Cardiovascular;  Laterality: N/A;   LUMBAR LAMINECTOMY/DECOMPRESSION MICRODISCECTOMY N/A 05/04/2016   Procedure: Laminectomy and Foraminotomy - Thoracic twelve-Lumbar one -Posterior Fusion Lumbar one-two;  Surgeon: Tia Alert, MD;  Location: [facility] OR;  Service: Neurosurgery;  Laterality: N/A;   POLYPECTOMY  03/14/2018   Procedure: POLYPECTOMY;  Surgeon: Kerin Salen, MD;  Location: [facility] ENDOSCOPY;  Service: Gastroenterology;;   POSTERIOR LUMBAR FUSION  10/18/2016   SHOULDER OPEN ROTATOR CUFF REPAIR Bilateral    TONSILLECTOMY AND ADENOIDECTOMY     TOTAL HIP ARTHROPLASTY Right 10/18/2018   Procedure: RIGHT TOTAL HIP ARTHROPLASTY ANTERIOR APPROACH;  Surgeon: Kathryne Hitch, MD;  Location: [facility] ORS;  Service: Orthopedics;  Laterality: Right;   TRIGGER FINGER RELEASE     LEFT   UVULOPALATOPHARYNGOPLASTY     VASECTOMY      Current Medications: Current Meds  Medication Sig   acetaminophen (TYLENOL) 500 MG tablet Take 1,000 mg by mouth every 8 (eight) hours as needed for mild pain or headache.    albuterol (VENTOLIN HFA) 108 (90 Base) MCG/ACT inhaler Inhale 2 puffs into the lungs every 4 (four) hours as needed for wheezing or shortness of breath.   baclofen (LIORESAL) 10 MG tablet Take 10  mg by mouth 3 (three) times daily as needed for muscle spasms.   budesonide-formoterol (SYMBICORT) 160-4.5 MCG/ACT inhaler Inhale 2 puffs into the lungs daily as needed (Asthma).   clopidogrel (PLAVIX) 75 MG tablet Take 1 tablet (75 mg total) by mouth daily.   dextromethorphan-guaiFENesin (MUCINEX DM) 30-600 MG 12hr tablet Take 2 tablets by mouth as needed for cough (Cold).   diphenoxylate-atropine (LOMOTIL) 2.5-0.025 MG tablet Take 1 tablet by mouth 4 (four) times daily as needed for diarrhea or loose stools.   fluticasone (FLONASE) 50 MCG/ACT nasal spray Place 1 spray into both nostrils as needed for allergies or rhinitis.   furosemide (LASIX) 40 MG tablet TAKE ONE TABLET (40MG ) BY MOUTH EACH DAY AS NEEDED FOR SWELLING OR WEIGHT GAIN. (Patient taking differently: Take 40 mg by mouth daily as needed for fluid or edema.)   Iron, Ferrous Sulfate, 75 (15 Fe) MG/ML SOLN Take 75 mg by mouth every other day.   JARDIANCE 25 MG TABS tablet Take 25 mg by mouth daily.   losartan (COZAAR) 25 MG tablet TAKE 1 TABLET BY MOUTH EVERY DAY   methocarbamol (ROBAXIN) 500 MG tablet Take 1 tablet (500 mg total) by mouth every 8 (eight) hours as needed for muscle spasms.   metoprolol tartrate (LOPRESSOR) 25 MG tablet Take 12.5 mg by mouth 2 (two) times daily.   nitroGLYCERIN (NITROSTAT) 0.4 MG SL tablet Place 1 tablet (0.4 mg total) under the tongue every 5 (five) minutes as needed for chest pain.   pantoprazole (PROTONIX) 40 MG tablet TAKE ONE TABLET BY MOUTH BEFORE BREAKFAST (TAKE ON AN EMPTY STOMACH 30 MINUTES PRIOR TO A MEAL)   Polyethyl Glycol-Propyl Glycol (SYSTANE OP) Place 1 drop into both eyes daily as needed (for dry eyes).    potassium chloride SA (KLOR-CON M20) 20 MEQ tablet Taking two tablet by mouth in the am and 1  tablet in the evening   psyllium (METAMUCIL) 58.6 % packet Take 1 packet by mouth daily as needed (Looose stool).   sildenafil (VIAGRA) 100 MG tablet Take 1 tablet (100 mg total) by mouth as  needed for erectile dysfunction.   Skin Protectants, Misc. (EUCERIN) cream Apply 1 application topically as needed for dry skin.   spironolactone (ALDACTONE) 25 MG tablet Take 1 tablet (25 mg total) by mouth daily.   tamsulosin (FLOMAX) 0.4 MG CAPS Take 0.4 mg by mouth every evening.    topiramate (TOPAMAX) 25 MG capsule Take 50 mg by mouth 2 (two) times daily.    [DISCONTINUED] augmented betamethasone dipropionate (DIPROLENE-AF) 0.05 % cream Apply 1 application topically 2 (two) times daily as needed for itching.   [DISCONTINUED] Cholecalciferol (VITAMIN D-3) 5000 units TABS Take 5,000 Units by mouth daily.   [DISCONTINUED] Coenzyme Q10 100 MG capsule Take 100 mg by mouth daily.      Allergies:   Other, Lotensin [benazepril], Tofranil [imipramine], Ace inhibitors, Adhesive [tape], Amoxicillin-pot clavulanate, Codeine, Latex, Metformin, Morphine, Nifedipine, and Quinolones   Social History   Socioeconomic History   Marital status: Married    Spouse name: Not on file   Number of children: 3   Years of education: Not on file   Highest education level: Not on file  Occupational History   Occupation: Retired from [employer], [employer]: RETIRED  Tobacco Use   Smoking status: Former    Packs/day: 1.50    Years: 30.00    Additional pack years: 0.00    Total pack years: 45.00    Types: Cigarettes    Start date: 65    Quit date: 01/24/1992    Years since quitting: 30.4   Smokeless tobacco: Never  Vaping Use   Vaping Use: Never used  Substance and Sexual Activity   Alcohol use: No   Drug use: No   Sexual activity: Not Currently  Other Topics Concern   Not on file  Social History Narrative   Not on file   Social Determinants of Health   Financial Resource Strain: Not on file  Food Insecurity: Not on file  Transportation Needs: Not on file  Physical Activity: Not on file  Stress: Not on file  Social Connections: Not on file    Family History: The patient's family history  includes Diabetes in his mother; Emphysema (age of onset: 18) in his father; Heart attack in his sister; Heart disease in his mother; Other in his mother.  ROS:   Please see the history of present illness.    All other systems reviewed and are negative.  EKGs/Labs/Other Studies Reviewed:    EKG:  EKG is ordered today.  The ekg ordered today demonstrates atrial fibrillation with stable HR at 62bpm.   Recent Labs: 02/21/2022: B Natriuretic Peptide 121.5 03/24/2022: ALT 9 06/12/2022: BUN 22; Creatinine, Ser 1.17; Hemoglobin WILL FOLLOW; Platelets WILL FOLLOW; Potassium 4.1; Sodium 142   Recent Lipid Panel    Component Value Date/Time   CHOL 118 01/07/2013 0410   TRIG 133 01/07/2013 0410   HDL 40 01/07/2013 0410   CHOLHDL 3.0 01/07/2013 0410   VLDL 27 01/07/2013 0410   LDLCALC 51 01/07/2013 0410   Risk Assessment/Calculations:    HAS-BLED score 4 Hypertension Yes  Abnormal renal and liver function (Dialysis, transplant, Cr >2.26 mg/dL /Cirrhosis or Bilirubin >2x Normal or AST/ALT/AP >3x Normal) No  Stroke No  Bleeding Yes  Labile INR (Unstable/high INR) No  Elderly (>65) Yes  Drugs or alcohol (? 8 drinks/week, anti-plt or NSAID) Yes    CHA2DS2-VASc Score = 4  The patient's score is based upon: CHF History: 1 HTN History: 1 Diabetes History: 0 Stroke History: 0 Vascular Disease History: 0 Age Score: 2 Gender Score: 0  Physical Exam:    VS:  BP (!) 110/56   Pulse 62   Ht 5\' 7"  (1.702 m)   Wt 243 lb 9.6 oz (110.5 kg)   SpO2 96%   BMI 38.15 kg/m     Wt Readings from Last 3 Encounters:  06/12/22 243 lb 9.6 oz (110.5 kg)  05/10/22 249 lb (112.9 kg)  04/07/22 253 lb 3.2 oz (114.9 kg)    General: Well developed, well nourished, NAD Lungs:Clear to ausculation bilaterally. No wheezes, rales, or rhonchi. Breathing is unlabored. Cardiovascular: Irregular. No murmurs Abdomen: Soft, non-tender, non-distended Extremities: No edema.  Neuro: Alert and oriented. No focal  deficits. No facial asymmetry. MAE spontaneously. Psych: Responds to questions appropriately with normal affect.    ASSESSMENT/PLAN:    PAF: s/p AF ablation who was referred to Dr. Lalla Brothers for LAAO closure with Watchman secondary to recurrent hematuria. Procedure instructions reviewed with patient with all questions answered. CHG soap given. Obtain CBC, BMET. Given significant bleeding with AC, he was started on Plavix 75mg  last week. He will be loaded with ASA on day of the procedure and will continue DAPT with ASA 81mg  QD and Plavix 75mg  QD through 6 months. Additionally, he will require dental SBE during that time as well. This will be discussed after implant.   CAD s/p BMS to 2nd OM 2012: Denies anginal symptoms. Continue Plavix.   Prostate cancer s/p radiation: Recent issues with hematuria secondary to radiation cystitis. Follows closely with urology.   Chronic diastolic CHF: Appears euvolemic on exam today. Weight down 6lb since last OV. Continue Lasix  Prior PE: Intolerant to anticoagulation. Plan to continue Plavix post LAAO closure.   HTN: Stable with no changes needed at this time.   Chronic back pain s/p multiple surgeries: Follows with the pain clinic. Concerned about post procedure bedrest.   Medication Adjustments/Labs and Tests Ordered: Current medicines are reviewed at length with the patient today.  Concerns regarding medicines are outlined above.  Orders Placed This Encounter  Procedures   Basic metabolic panel   CBC   EKG 12-Lead   No orders of the defined types were placed in this encounter.   Patient Instructions  Medication Instructions:  Your physician recommends that you continue on your current medications as directed. Please refer to the Current Medication list given to you today.  *If you need a refill on your cardiac medications before your next appointment, please call your pharmacy*   Lab Work: TODAY: BMET, CBC If you have labs (blood work) drawn  today and your tests are completely normal, you will receive your results only by: MyChart Message (if you have MyChart) OR A paper copy in the mail If you have any lab test that is abnormal or we need to change your treatment, we will call you to review the results.   Testing/Procedures: SEE INSTRUCTION LETTER   Follow-Up: At [facility], you and your health needs are our priority.  As part of our continuing mission to provide you with exceptional heart care, we have created designated Provider Care Teams.  These Care Teams include your primary Cardiologist (physician) and Advanced Practice Providers (APPs -  Physician Assistants and Nurse Practitioners) who all work together to  provide you with the care you need, when you need it.  We recommend signing up for the patient portal called "MyChart".  Sign up information is provided on this After Visit Summary.  MyChart is used to connect with patients for Virtual Visits (Telemedicine).  Patients are able to view lab/test results, encounter notes, upcoming appointments, etc.  Non-urgent messages can be sent to your provider as well.   To learn more about what you can do with MyChart, go to [email or web address].    Your next appointment:   POST PROCEDURAL FOLLOW-UP WILL BE SCHEDULED AT [facility]    Signed, Georgie Chard, NP  06/13/2022 9:15 AM    [facility]

## 2022-06-12 ENCOUNTER — Ambulatory Visit (INDEPENDENT_AMBULATORY_CARE_PROVIDER_SITE_OTHER): Payer: Medicare Other | Admitting: Cardiology

## 2022-06-12 ENCOUNTER — Telehealth: Payer: Self-pay | Admitting: Pulmonary Disease

## 2022-06-12 VITALS — BP 110/56 | HR 62 | Ht 67.0 in | Wt 243.6 lb

## 2022-06-12 DIAGNOSIS — E119 Type 2 diabetes mellitus without complications: Secondary | ICD-10-CM | POA: Diagnosis not present

## 2022-06-12 DIAGNOSIS — R262 Difficulty in walking, not elsewhere classified: Secondary | ICD-10-CM | POA: Diagnosis not present

## 2022-06-12 DIAGNOSIS — I5032 Chronic diastolic (congestive) heart failure: Secondary | ICD-10-CM

## 2022-06-12 DIAGNOSIS — Z87891 Personal history of nicotine dependence: Secondary | ICD-10-CM | POA: Diagnosis not present

## 2022-06-12 DIAGNOSIS — I4891 Unspecified atrial fibrillation: Secondary | ICD-10-CM | POA: Diagnosis present

## 2022-06-12 DIAGNOSIS — I480 Paroxysmal atrial fibrillation: Secondary | ICD-10-CM

## 2022-06-12 DIAGNOSIS — Z01818 Encounter for other preprocedural examination: Secondary | ICD-10-CM | POA: Diagnosis not present

## 2022-06-12 DIAGNOSIS — I7121 Aneurysm of the ascending aorta, without rupture: Secondary | ICD-10-CM

## 2022-06-12 DIAGNOSIS — R319 Hematuria, unspecified: Secondary | ICD-10-CM | POA: Diagnosis present

## 2022-06-12 DIAGNOSIS — M4325 Fusion of spine, thoracolumbar region: Secondary | ICD-10-CM | POA: Diagnosis not present

## 2022-06-12 DIAGNOSIS — I2510 Atherosclerotic heart disease of native coronary artery without angina pectoris: Secondary | ICD-10-CM | POA: Diagnosis not present

## 2022-06-12 DIAGNOSIS — I110 Hypertensive heart disease with heart failure: Secondary | ICD-10-CM | POA: Diagnosis not present

## 2022-06-12 DIAGNOSIS — N3041 Irradiation cystitis with hematuria: Secondary | ICD-10-CM | POA: Diagnosis not present

## 2022-06-12 DIAGNOSIS — Z888 Allergy status to other drugs, medicaments and biological substances status: Secondary | ICD-10-CM | POA: Diagnosis not present

## 2022-06-12 DIAGNOSIS — E785 Hyperlipidemia, unspecified: Secondary | ICD-10-CM | POA: Diagnosis not present

## 2022-06-12 DIAGNOSIS — Z86711 Personal history of pulmonary embolism: Secondary | ICD-10-CM | POA: Diagnosis not present

## 2022-06-12 DIAGNOSIS — Z91048 Other nonmedicinal substance allergy status: Secondary | ICD-10-CM | POA: Diagnosis not present

## 2022-06-12 DIAGNOSIS — Z885 Allergy status to narcotic agent status: Secondary | ICD-10-CM | POA: Diagnosis not present

## 2022-06-12 DIAGNOSIS — C61 Malignant neoplasm of prostate: Secondary | ICD-10-CM | POA: Diagnosis not present

## 2022-06-12 DIAGNOSIS — Z880 Allergy status to penicillin: Secondary | ICD-10-CM | POA: Diagnosis not present

## 2022-06-12 DIAGNOSIS — Z881 Allergy status to other antibiotic agents status: Secondary | ICD-10-CM | POA: Diagnosis not present

## 2022-06-12 DIAGNOSIS — Z91040 Latex allergy status: Secondary | ICD-10-CM | POA: Diagnosis not present

## 2022-06-12 DIAGNOSIS — J4520 Mild intermittent asthma, uncomplicated: Secondary | ICD-10-CM | POA: Diagnosis not present

## 2022-06-12 DIAGNOSIS — E669 Obesity, unspecified: Secondary | ICD-10-CM | POA: Diagnosis not present

## 2022-06-12 DIAGNOSIS — M6281 Muscle weakness (generalized): Secondary | ICD-10-CM | POA: Diagnosis not present

## 2022-06-12 DIAGNOSIS — G8929 Other chronic pain: Secondary | ICD-10-CM | POA: Diagnosis not present

## 2022-06-12 DIAGNOSIS — M549 Dorsalgia, unspecified: Secondary | ICD-10-CM | POA: Diagnosis present

## 2022-06-12 DIAGNOSIS — J449 Chronic obstructive pulmonary disease, unspecified: Secondary | ICD-10-CM | POA: Diagnosis not present

## 2022-06-12 DIAGNOSIS — I10 Essential (primary) hypertension: Secondary | ICD-10-CM | POA: Diagnosis not present

## 2022-06-12 DIAGNOSIS — Z8546 Personal history of malignant neoplasm of prostate: Secondary | ICD-10-CM | POA: Diagnosis not present

## 2022-06-12 DIAGNOSIS — Z01810 Encounter for preprocedural cardiovascular examination: Secondary | ICD-10-CM

## 2022-06-12 DIAGNOSIS — I509 Heart failure, unspecified: Secondary | ICD-10-CM | POA: Diagnosis not present

## 2022-06-12 DIAGNOSIS — Z923 Personal history of irradiation: Secondary | ICD-10-CM | POA: Diagnosis not present

## 2022-06-12 DIAGNOSIS — Z006 Encounter for examination for normal comparison and control in clinical research program: Secondary | ICD-10-CM | POA: Diagnosis not present

## 2022-06-12 DIAGNOSIS — Z955 Presence of coronary angioplasty implant and graft: Secondary | ICD-10-CM | POA: Diagnosis not present

## 2022-06-12 DIAGNOSIS — Y842 Radiological procedure and radiotherapy as the cause of abnormal reaction of the patient, or of later complication, without mention of misadventure at the time of the procedure: Secondary | ICD-10-CM | POA: Diagnosis present

## 2022-06-12 DIAGNOSIS — E1151 Type 2 diabetes mellitus with diabetic peripheral angiopathy without gangrene: Secondary | ICD-10-CM | POA: Diagnosis not present

## 2022-06-12 NOTE — Telephone Encounter (Signed)
Patient states needs office notes sent to Dr. Pearson Grippe. Also needs medications. Fax number is [phone].

## 2022-06-12 NOTE — Patient Instructions (Signed)
Medication Instructions:  Your physician recommends that you continue on your current medications as directed. Please refer to the Current Medication list given to you today.  *If you need a refill on your cardiac medications before your next appointment, please call your pharmacy*   Lab Work: TODAY: BMET, CBC If you have labs (blood work) drawn today and your tests are completely normal, you will receive your results only by: MyChart Message (if you have MyChart) OR A paper copy in the mail If you have any lab test that is abnormal or we need to change your treatment, we will call you to review the results.   Testing/Procedures: SEE INSTRUCTION LETTER   Follow-Up: At [facility], you and your health needs are our priority.  As part of our continuing mission to provide you with exceptional heart care, we have created designated Provider Care Teams.  These Care Teams include your primary Cardiologist (physician) and Advanced Practice Providers (APPs -  Physician Assistants and Nurse Practitioners) who all work together to provide you with the care you need, when you need it.  We recommend signing up for the patient portal called "MyChart".  Sign up information is provided on this After Visit Summary.  MyChart is used to connect with patients for Virtual Visits (Telemedicine).  Patients are able to view lab/test results, encounter notes, upcoming appointments, etc.  Non-urgent messages can be sent to your provider as well.   To learn more about what you can do with MyChart, go to [email or web address].    Your next appointment:   POST PROCEDURAL FOLLOW-UP WILL BE SCHEDULED AT [facility]

## 2022-06-13 LAB — BASIC METABOLIC PANEL
BUN/Creatinine Ratio: 19 (ref 10–24)
BUN: 22 mg/dL (ref 8–27)
CO2: 20 mmol/L (ref 20–29)
Calcium: 8.6 mg/dL (ref 8.6–10.2)
Chloride: 108 mmol/L — ABNORMAL HIGH (ref 96–106)
Creatinine, Ser: 1.17 mg/dL (ref 0.76–1.27)
Glucose: 138 mg/dL — ABNORMAL HIGH (ref 70–99)
Potassium: 4.1 mmol/L (ref 3.5–5.2)
Sodium: 142 mmol/L (ref 134–144)
eGFR: 63 mL/min/{1.73_m2} (ref >59)

## 2022-06-13 LAB — CBC
Hematocrit: 29.6 % — ABNORMAL LOW (ref 37.5–51.0)
Hemoglobin: 9.7 g/dL — ABNORMAL LOW (ref 13.0–17.7)
MCH: 27.6 pg (ref 26.6–33.0)
MCHC: 32.8 g/dL (ref 31.5–35.7)
MCV: 84 fL (ref 79–97)
Platelets: 139 10*3/uL — ABNORMAL LOW (ref 150–450)
RBC: 3.51 x10E6/uL — ABNORMAL LOW (ref 4.14–5.80)
RDW: 21 % — ABNORMAL HIGH (ref 11.6–15.4)
WBC: 5.3 10*3/uL (ref 3.4–10.8)

## 2022-06-13 NOTE — Telephone Encounter (Signed)
Patient came to office requesting his last office note and list of medications to be faxed to [facility] Dr.Records faxed to Dr.Jennings at [facility] at fax # [phone].

## 2022-06-13 NOTE — Telephone Encounter (Signed)
Called and spoke with patient. Patient verified which office notes he needed to be faxed over. Office notes and medication list has been printed out and faxed over.   Nothing further needed.

## 2022-06-13 NOTE — Telephone Encounter (Signed)
ATC patient. LVMTCB. 

## 2022-06-14 ENCOUNTER — Telehealth: Payer: Self-pay

## 2022-06-14 NOTE — Anesthesia Preprocedure Evaluation (Signed)
Anesthesia Evaluation  Patient identified by MRN, date of birth, ID band Patient awake    Reviewed: Allergy & Precautions, NPO status , Patient's Chart, lab work & pertinent test results  History of Anesthesia Complications Negative for: history of anesthetic complications  Airway Mallampati: III  TM Distance: >3 FB Neck ROM: Limited    Dental  (+) Dental Advisory Given   Pulmonary sleep apnea and Continuous Positive Airway Pressure Ventilation , COPD,  COPD inhaler, former smoker, PE   breath sounds clear to auscultation       Cardiovascular hypertension, Pt. on medications (-) angina + CAD, + Cardiac Stents, + Peripheral Vascular Disease, +CHF and + DVT  + dysrhythmias Atrial Fibrillation  Rhythm:Irregular Rate:Normal  03/2022 ECHO: EF 60-65%, normal LVF, mild LVH, Grade 2 DD, normal RVF, trivial MR, mild-mod AS with mean gradient 14 mmHg   Neuro/Psych  Headaches  Anxiety        GI/Hepatic Neg liver ROS,GERD  Medicated and Controlled,,  Endo/Other  diabetes (glu 142)  BMI 38  Renal/GU Renal InsufficiencyRenal disease     Musculoskeletal   Abdominal  (+) + obese  Peds  Hematology  (+) Blood dyscrasia (Hb 9.7), anemia   Anesthesia Other Findings Prostate cancer  Reproductive/Obstetrics                             Anesthesia Physical Anesthesia Plan  ASA: 4  Anesthesia Plan: General   Post-op Pain Management: Tylenol PO (pre-op)*   Induction: Intravenous  PONV Risk Score and Plan: 2 and Ondansetron and Dexamethasone  Airway Management Planned: Oral ETT and Video Laryngoscope Planned  Additional Equipment: ClearSight  Intra-op Plan:   Post-operative Plan: Extubation in OR  Informed Consent: I have reviewed the patients History and Physical, chart, labs and discussed the procedure including the risks, benefits and alternatives for the proposed anesthesia with the patient or  authorized representative who has indicated his/her understanding and acceptance.     Dental advisory given  Plan Discussed with: CRNA and Surgeon  Anesthesia Plan Comments:        Anesthesia Quick Evaluation

## 2022-06-14 NOTE — Telephone Encounter (Signed)
Patient came to office yesterday requested his last office note and a list of medications to be faxed to [place] in [place].Records faxed to Dr.Jennings at fax # [phone].

## 2022-06-15 ENCOUNTER — Other Ambulatory Visit: Payer: Self-pay

## 2022-06-15 ENCOUNTER — Inpatient Hospital Stay (HOSPITAL_COMMUNITY): Payer: Medicare Other | Admitting: Certified Registered"

## 2022-06-15 ENCOUNTER — Inpatient Hospital Stay (HOSPITAL_COMMUNITY): Payer: Medicare Other

## 2022-06-15 ENCOUNTER — Inpatient Hospital Stay (HOSPITAL_COMMUNITY)
Admission: RE | Disposition: A | Discharge status: Home / Self Care | Payer: Self-pay | Source: Home / Self Care | Attending: Cardiology

## 2022-06-15 ENCOUNTER — Inpatient Hospital Stay (HOSPITAL_COMMUNITY)
Admission: RE | Admit: 2022-06-15 | Discharge: 2022-06-15 | DRG: 274 | Disposition: A | Discharge status: Home Health | Payer: Medicare Other | Attending: Cardiology | Admitting: Cardiology

## 2022-06-15 ENCOUNTER — Encounter (HOSPITAL_COMMUNITY): Payer: Self-pay | Admitting: Cardiology

## 2022-06-15 ENCOUNTER — Inpatient Hospital Stay (HOSPITAL_COMMUNITY)
Admission: RE | Admit: 2022-06-15 | Discharge: 2022-06-15 | Disposition: A | Discharge status: Home / Self Care | Payer: Medicare Other | Source: Home / Self Care | Attending: Cardiology | Admitting: Cardiology

## 2022-06-15 DIAGNOSIS — Z006 Encounter for examination for normal comparison and control in clinical research program: Secondary | ICD-10-CM | POA: Diagnosis not present

## 2022-06-15 DIAGNOSIS — E785 Hyperlipidemia, unspecified: Secondary | ICD-10-CM | POA: Diagnosis present

## 2022-06-15 DIAGNOSIS — I10 Essential (primary) hypertension: Secondary | ICD-10-CM | POA: Diagnosis present

## 2022-06-15 DIAGNOSIS — M549 Dorsalgia, unspecified: Secondary | ICD-10-CM | POA: Diagnosis present

## 2022-06-15 DIAGNOSIS — Z825 Family history of asthma and other chronic lower respiratory diseases: Secondary | ICD-10-CM

## 2022-06-15 DIAGNOSIS — Z885 Allergy status to narcotic agent status: Secondary | ICD-10-CM | POA: Diagnosis not present

## 2022-06-15 DIAGNOSIS — Z833 Family history of diabetes mellitus: Secondary | ICD-10-CM

## 2022-06-15 DIAGNOSIS — N3041 Irradiation cystitis with hematuria: Secondary | ICD-10-CM | POA: Diagnosis present

## 2022-06-15 DIAGNOSIS — H9190 Unspecified hearing loss, unspecified ear: Secondary | ICD-10-CM | POA: Diagnosis present

## 2022-06-15 DIAGNOSIS — Z87891 Personal history of nicotine dependence: Secondary | ICD-10-CM

## 2022-06-15 DIAGNOSIS — K219 Gastro-esophageal reflux disease without esophagitis: Secondary | ICD-10-CM | POA: Diagnosis present

## 2022-06-15 DIAGNOSIS — I480 Paroxysmal atrial fibrillation: Secondary | ICD-10-CM

## 2022-06-15 DIAGNOSIS — G4733 Obstructive sleep apnea (adult) (pediatric): Secondary | ICD-10-CM | POA: Diagnosis present

## 2022-06-15 DIAGNOSIS — Z955 Presence of coronary angioplasty implant and graft: Secondary | ICD-10-CM | POA: Diagnosis not present

## 2022-06-15 DIAGNOSIS — Z881 Allergy status to other antibiotic agents status: Secondary | ICD-10-CM

## 2022-06-15 DIAGNOSIS — I7121 Aneurysm of the ascending aorta, without rupture: Secondary | ICD-10-CM | POA: Diagnosis present

## 2022-06-15 DIAGNOSIS — Z79899 Other long term (current) drug therapy: Secondary | ICD-10-CM

## 2022-06-15 DIAGNOSIS — I509 Heart failure, unspecified: Secondary | ICD-10-CM | POA: Diagnosis not present

## 2022-06-15 DIAGNOSIS — I4891 Unspecified atrial fibrillation: Secondary | ICD-10-CM | POA: Diagnosis present

## 2022-06-15 DIAGNOSIS — E119 Type 2 diabetes mellitus without complications: Secondary | ICD-10-CM | POA: Diagnosis present

## 2022-06-15 DIAGNOSIS — Z7984 Long term (current) use of oral hypoglycemic drugs: Secondary | ICD-10-CM

## 2022-06-15 DIAGNOSIS — Z923 Personal history of irradiation: Secondary | ICD-10-CM

## 2022-06-15 DIAGNOSIS — Z8546 Personal history of malignant neoplasm of prostate: Secondary | ICD-10-CM | POA: Diagnosis not present

## 2022-06-15 DIAGNOSIS — Y842 Radiological procedure and radiotherapy as the cause of abnormal reaction of the patient, or of later complication, without mention of misadventure at the time of the procedure: Secondary | ICD-10-CM | POA: Diagnosis present

## 2022-06-15 DIAGNOSIS — Z7951 Long term (current) use of inhaled steroids: Secondary | ICD-10-CM

## 2022-06-15 DIAGNOSIS — Z91048 Other nonmedicinal substance allergy status: Secondary | ICD-10-CM | POA: Diagnosis not present

## 2022-06-15 DIAGNOSIS — Z96641 Presence of right artificial hip joint: Secondary | ICD-10-CM | POA: Diagnosis present

## 2022-06-15 DIAGNOSIS — I2510 Atherosclerotic heart disease of native coronary artery without angina pectoris: Secondary | ICD-10-CM | POA: Diagnosis present

## 2022-06-15 DIAGNOSIS — E669 Obesity, unspecified: Secondary | ICD-10-CM | POA: Diagnosis present

## 2022-06-15 DIAGNOSIS — I5032 Chronic diastolic (congestive) heart failure: Secondary | ICD-10-CM | POA: Diagnosis present

## 2022-06-15 DIAGNOSIS — G8929 Other chronic pain: Secondary | ICD-10-CM | POA: Diagnosis present

## 2022-06-15 DIAGNOSIS — J4520 Mild intermittent asthma, uncomplicated: Secondary | ICD-10-CM | POA: Diagnosis present

## 2022-06-15 DIAGNOSIS — Z01818 Encounter for other preprocedural examination: Secondary | ICD-10-CM | POA: Diagnosis not present

## 2022-06-15 DIAGNOSIS — Z7902 Long term (current) use of antithrombotics/antiplatelets: Secondary | ICD-10-CM

## 2022-06-15 DIAGNOSIS — J449 Chronic obstructive pulmonary disease, unspecified: Secondary | ICD-10-CM | POA: Diagnosis not present

## 2022-06-15 DIAGNOSIS — E1151 Type 2 diabetes mellitus with diabetic peripheral angiopathy without gangrene: Secondary | ICD-10-CM | POA: Diagnosis not present

## 2022-06-15 DIAGNOSIS — R319 Hematuria, unspecified: Secondary | ICD-10-CM | POA: Diagnosis present

## 2022-06-15 DIAGNOSIS — R0789 Other chest pain: Secondary | ICD-10-CM | POA: Diagnosis not present

## 2022-06-15 DIAGNOSIS — Z880 Allergy status to penicillin: Secondary | ICD-10-CM | POA: Diagnosis not present

## 2022-06-15 DIAGNOSIS — Z91040 Latex allergy status: Secondary | ICD-10-CM | POA: Diagnosis not present

## 2022-06-15 DIAGNOSIS — Z95818 Presence of other cardiac implants and grafts: Principal | ICD-10-CM

## 2022-06-15 DIAGNOSIS — Z86718 Personal history of other venous thrombosis and embolism: Secondary | ICD-10-CM

## 2022-06-15 DIAGNOSIS — I110 Hypertensive heart disease with heart failure: Secondary | ICD-10-CM | POA: Diagnosis present

## 2022-06-15 DIAGNOSIS — Z888 Allergy status to other drugs, medicaments and biological substances status: Secondary | ICD-10-CM

## 2022-06-15 DIAGNOSIS — Z86711 Personal history of pulmonary embolism: Secondary | ICD-10-CM

## 2022-06-15 DIAGNOSIS — Z6838 Body mass index (BMI) 38.0-38.9, adult: Secondary | ICD-10-CM

## 2022-06-15 DIAGNOSIS — Z9884 Bariatric surgery status: Secondary | ICD-10-CM

## 2022-06-15 DIAGNOSIS — I712 Thoracic aortic aneurysm, without rupture, unspecified: Secondary | ICD-10-CM | POA: Diagnosis present

## 2022-06-15 DIAGNOSIS — R079 Chest pain, unspecified: Secondary | ICD-10-CM | POA: Insufficient documentation

## 2022-06-15 DIAGNOSIS — I2699 Other pulmonary embolism without acute cor pulmonale: Secondary | ICD-10-CM | POA: Diagnosis present

## 2022-06-15 HISTORY — PX: TEE WITHOUT CARDIOVERSION: SHX5443

## 2022-06-15 HISTORY — DX: Presence of other cardiac implants and grafts: Z95.818

## 2022-06-15 HISTORY — PX: LEFT ATRIAL APPENDAGE OCCLUSION: EP1229

## 2022-06-15 LAB — SURGICAL PCR SCREEN
MRSA, PCR: NEGATIVE
Staphylococcus aureus: NEGATIVE

## 2022-06-15 LAB — GLUCOSE, CAPILLARY
Glucose-Capillary: 142 mg/dL — ABNORMAL HIGH (ref 70–99)
Glucose-Capillary: 119 mg/dL — ABNORMAL HIGH (ref 70–99)

## 2022-06-15 LAB — TYPE AND SCREEN
ABO/RH(D): O POS
Antibody Screen: NEGATIVE

## 2022-06-15 LAB — ECHO TEE

## 2022-06-15 LAB — POCT ACTIVATED CLOTTING TIME: Activated Clotting Time: 331 seconds

## 2022-06-15 SURGERY — LEFT ATRIAL APPENDAGE OCCLUSION
Anesthesia: General

## 2022-06-15 MED ORDER — PROTAMINE SULFATE 10 MG/ML IV SOLN
INTRAVENOUS | Status: DC | PRN
  Administered 2022-06-15: 30 mg via INTRAVENOUS

## 2022-06-15 MED ORDER — SODIUM CHLORIDE 0.9 % IV SOLN: INTRAVENOUS | Status: DC

## 2022-06-15 MED ORDER — LIDOCAINE 2% (20 MG/ML) 5 ML SYRINGE
INTRAMUSCULAR | Status: DC | PRN
  Administered 2022-06-15: 20 mg via INTRAVENOUS

## 2022-06-15 MED ORDER — LACTATED RINGERS IV SOLN: INTRAVENOUS | Status: DC | PRN

## 2022-06-15 MED ORDER — HEPARIN (PORCINE) IN NACL 1000-0.9 UT/500ML-% IV SOLN
INTRAVENOUS | Status: DC | PRN
  Administered 2022-06-15: 500 mL

## 2022-06-15 MED ORDER — PHENYLEPHRINE 80 MCG/ML (10ML) SYRINGE FOR IV PUSH (FOR BLOOD PRESSURE SUPPORT)
PREFILLED_SYRINGE | INTRAVENOUS | Status: DC | PRN
  Administered 2022-06-15 (×2): 80 ug via INTRAVENOUS

## 2022-06-15 MED ORDER — HEPARIN (PORCINE) IN NACL 2000-0.9 UNIT/L-% IV SOLN
INTRAVENOUS | Status: DC | PRN
  Administered 2022-06-15: 1000 mL

## 2022-06-15 MED ORDER — SODIUM CHLORIDE 0.9% FLUSH: 3.0000 mL | INTRAVENOUS | Status: DC | PRN

## 2022-06-15 MED ORDER — VANCOMYCIN HCL 1500 MG/300ML IV SOLN
1500.0000 mg | INTRAVENOUS | Status: AC
  Administered 2022-06-15: 1500 mg via INTRAVENOUS
  Filled 2022-06-15 (×2): qty 300

## 2022-06-15 MED ORDER — PROPOFOL 10 MG/ML IV BOLUS
INTRAVENOUS | Status: DC | PRN
  Administered 2022-06-15: 70 mg via INTRAVENOUS

## 2022-06-15 MED ORDER — ONDANSETRON HCL 4 MG/2ML IJ SOLN: 4.0000 mg | Freq: Four times a day (QID) | INTRAMUSCULAR | Status: DC | PRN

## 2022-06-15 MED ORDER — ASPIRIN 325 MG PO TABS
325.0000 mg | ORAL_TABLET | Freq: Every day | ORAL | Status: DC
  Administered 2022-06-15: 325 mg via ORAL
  Filled 2022-06-15: qty 1

## 2022-06-15 MED ORDER — PHENYLEPHRINE HCL-NACL 20-0.9 MG/250ML-% IV SOLN
INTRAVENOUS | Status: DC | PRN
  Administered 2022-06-15: 20 ug/min via INTRAVENOUS

## 2022-06-15 MED ORDER — CHLORHEXIDINE GLUCONATE 0.12 % MT SOLN
OROMUCOSAL | Status: AC
  Administered 2022-06-15: 15 mL
  Filled 2022-06-15: qty 15

## 2022-06-15 MED ORDER — CHLORHEXIDINE GLUCONATE 4 % EX SOLN
Freq: Once | CUTANEOUS | Status: DC
  Filled 2022-06-15: qty 15

## 2022-06-15 MED ORDER — SODIUM CHLORIDE 0.9% FLUSH
3.0000 mL | Freq: Two times a day (BID) | INTRAVENOUS | Status: DC
  Administered 2022-06-15: 3 mL via INTRAVENOUS

## 2022-06-15 MED ORDER — ROCURONIUM BROMIDE 10 MG/ML (PF) SYRINGE
PREFILLED_SYRINGE | INTRAVENOUS | Status: DC | PRN
  Administered 2022-06-15: 70 mg via INTRAVENOUS

## 2022-06-15 MED ORDER — HEPARIN SODIUM (PORCINE) 1000 UNIT/ML IJ SOLN
INTRAMUSCULAR | Status: DC | PRN
  Administered 2022-06-15: 16000 [IU] via INTRAVENOUS

## 2022-06-15 MED ORDER — ONDANSETRON HCL 4 MG/2ML IJ SOLN
INTRAMUSCULAR | Status: DC | PRN
  Administered 2022-06-15: 4 mg via INTRAVENOUS

## 2022-06-15 MED ORDER — ACETAMINOPHEN 500 MG PO TABS
1000.0000 mg | ORAL_TABLET | Freq: Once | ORAL | Status: AC
  Administered 2022-06-15: 1000 mg via ORAL
  Filled 2022-06-15: qty 2

## 2022-06-15 MED ORDER — INSULIN ASPART 100 UNIT/ML IJ SOLN: 0.0000 [IU] | INTRAMUSCULAR | Status: DC | PRN

## 2022-06-15 MED ORDER — SODIUM CHLORIDE 0.9 % IV SOLN: 250.0000 mL | INTRAVENOUS | Status: DC | PRN

## 2022-06-15 MED ORDER — ASPIRIN 81 MG PO TBEC: 81.0000 mg | DELAYED_RELEASE_TABLET | Freq: Every day | ORAL | 0 refills | Status: DC

## 2022-06-15 MED ORDER — IOHEXOL 350 MG/ML SOLN
INTRAVENOUS | Status: DC | PRN
  Administered 2022-06-15: 30 mL

## 2022-06-15 MED ORDER — FENTANYL CITRATE (PF) 100 MCG/2ML IJ SOLN
INTRAMUSCULAR | Status: AC
  Filled 2022-06-15: qty 2

## 2022-06-15 MED ORDER — DEXAMETHASONE SODIUM PHOSPHATE 10 MG/ML IJ SOLN
INTRAMUSCULAR | Status: DC | PRN
  Administered 2022-06-15: 10 mg via INTRAVENOUS

## 2022-06-15 MED ORDER — ACETAMINOPHEN 325 MG PO TABS
650.0000 mg | ORAL_TABLET | ORAL | Status: DC | PRN
  Administered 2022-06-15: 650 mg via ORAL
  Filled 2022-06-15: qty 2

## 2022-06-15 MED ORDER — FENTANYL CITRATE (PF) 250 MCG/5ML IJ SOLN
INTRAMUSCULAR | Status: DC | PRN
  Administered 2022-06-15: 100 ug via INTRAVENOUS

## 2022-06-15 SURGICAL SUPPLY — 20 items
CATH DIAG 6FR PIGTAIL ANGLED (CATHETERS) IMPLANT
CLOSURE PERCLOSE PROSTYLE (VASCULAR PRODUCTS) IMPLANT
COVER DOME SNAP 22 D (MISCELLANEOUS) IMPLANT
DEVICE WATCHMAN FLX PRO PROC (KITS) IMPLANT
DEVICE WATCHMAN FXD CRV PROC (KITS) IMPLANT
DILATOR VESSEL 38 20CM 12FR (INTRODUCER) IMPLANT
KIT HEART LEFT (KITS) ×1 IMPLANT
KIT SHEA VERSACROSS LAAC CONNE (KITS) IMPLANT
PACK CARDIAC CATHETERIZATION (CUSTOM PROCEDURE TRAY) ×1 IMPLANT
PAD DEFIB RADIO PHYSIO CONN (PAD) ×1 IMPLANT
SHEATH PERFORMER 16FR 30 (SHEATH) IMPLANT
SHEATH PINNACLE 8F 10CM (SHEATH) IMPLANT
SHEATH PROBE COVER 6X72 (BAG) ×1 IMPLANT
SYS WATCHMAN FXD DBL (SHEATH) ×1
SYSTEM WATCHMAN FXD DBL (SHEATH) IMPLANT
TRANSDUCER W/STOPCOCK (MISCELLANEOUS) ×1 IMPLANT
TUBING CIL FLEX 10 FLL-RA (TUBING) ×1 IMPLANT
WATCHMAN FLX PRO 24 (Prosthesis & Implant Heart) IMPLANT
WATCHMAN FLX PRO PROCEDURE (KITS) ×1 IMPLANT
WATCHMAN FXD CRV SYS PROCEDURE (KITS) ×1 IMPLANT

## 2022-06-15 NOTE — Progress Notes (Signed)
  [facility]ULAR [facility]ENTER   MULTIDISCIPLINARY HEART TEAM  Patient doing well s/p LAAO. He is hemodynamically stable. Groin sites stable. He does report chest pressure after transferring from cath lab holding area to patient room. EKG performed with no changes. Likely secondary to recent cardiac manipulation with septal puncture. Plan for Tylenol now and will reassess later today. Plan for early ambulation after bedrest completed and hopeful discharge later today.    Georgie Chard NP-C Structural Heart Team  Pager: [phone] Phone: [phone]

## 2022-06-15 NOTE — H&P (Signed)
Electrophysiology Office Follow up Visit Note:     Date:  06/15/2022    ID:  Carlos Dawson, DOB 1940-05-16, MRN [id number]   PCP:  Thana Ates, MD       [facility] Cardiologist:  Peter [name], MD  [facility] Electrophysiologist:  Lanier Prude, MD      Interval History:     Carlos Dawson is a 82 y.o. male who presents for a follow up visit.    I last saw the patient August 18, 2021 for watchman evaluation.  He has paroxysmal atrial fibrillation and had a prior ablation with Dr. Johney Frame May 11, 2017.  He was on Xarelto.  He was using chronic NSAIDs and he was concerned about the risk of bleeding.  He wanted some time to consider his options before finalizing a treatment plan.   Today he presents to discuss watchman implant again.  He has had another episode of hematuria due to radiation cystitis.  He has been off his anticoagulant for 1 month and is concerned about his risk of stroke being off his anticoagulant given his history of atrial fibrillation.  Presents for LAAO today. Started Plavix 1 week ago and has tolerated without any significant bleeding.     Objective  Past medical, surgical, social and family history were reviewed.   ROS:   Please see the history of present illness.    All other systems reviewed and are negative.   EKGs/Labs/Other Studies Reviewed:     The following studies were reviewed today:         Physical Exam:     VS:  BP 121/71   Pulse 67   Ht 5\' 7"  (1.702 m)   Wt 249 lb (112.9 kg)   SpO2 98%   BMI 39.00 kg/m         Wt Readings from Last 3 Encounters:  05/10/22 249 lb (112.9 kg)  04/07/22 253 lb 3.2 oz (114.9 kg)  04/03/22 244 lb 14.9 oz (111.1 kg)      GEN:  Well nourished, well developed in no acute distress CARDIAC: RRR, no murmurs, rubs, gallops RESPIRATORY:  Clear to auscultation without rales, wheezing or rhonchi          Assessment ASSESSMENT:     1. Paroxysmal atrial fibrillation   2. Primary  hypertension     PLAN:     In order of problems listed above:   #Paroxysmal atrial fibrillation Has been off his anticoagulant for 1 month and wishes to have a stroke risk mitigation strategy that avoids long-term exposure anticoagulation.  I discussed watchman again with the patient and his family and they are interested in proceeding.   I have seen [name] in the office today who is being considered for a Watchman left atrial appendage closure device. I believe they will benefit from this procedure given their history of atrial fibrillation, CHA2DS2-VASc score of 4 and unadjusted ischemic stroke rate of 4.8% per year. Unfortunately, the patient is not felt to be a long term anticoagulation candidate secondary to a desire for chronic use of NSAIDs. The patient's chart has been reviewed and I feel that they would be a candidate for short term oral anticoagulation after Watchman implant.    It is my belief that after undergoing a LAA closure procedure, Carlos Dawson will not need long term anticoagulation which eliminates anticoagulation side effects and major bleeding risk.    Procedural risks for the Watchman implant have been  reviewed with the patient including a 0.5% risk of stroke, <1% risk of perforation and <1% risk of device embolization. Other risks include bleeding, vascular damage, tamponade, worsening renal function, and death. The patient understands these risks.       The published clinical data on the safety and effectiveness of WATCHMAN include but are not limited to the following: - Holmes DR, Everlene Farrier, Sick P et al. for the PROTECT AF Investigators. Percutaneous closure of the left atrial appendage versus warfarin therapy for prevention of stroke in patients with atrial fibrillation: a randomised non-inferiority trial. Lancet 2009; 374: 534-42. Everlene Farrier, Doshi SK, Isa Rankin D et al. on behalf of the PROTECT AF Investigators. Percutaneous Left Atrial Appendage  Closure for Stroke Prophylaxis in Patients With Atrial Fibrillation 2.3-Year Follow-up of the PROTECT AF (Watchman Left Atrial Appendage System for Embolic Protection in Patients With Atrial Fibrillation) Trial. Circulation 2013; 127:720-729. - Alli O, Doshi S,  Kar S, Reddy VY, Sievert H et al. Quality of Life Assessment in the Randomized PROTECT AF (Percutaneous Closure of the Left Atrial Appendage Versus Warfarin Therapy for Prevention of Stroke in Patients With Atrial Fibrillation) Trial of Patients at Risk for Stroke With Nonvalvular Atrial Fibrillation. J Am Coll Cardiol 2013; 61:1790-8. Aline August DR, Mia [place], Price M, Whisenant B, Sievert H, Doshi S, Huber K, Reddy V. Prospective randomized evaluation of the Watchman left atrial appendage Device in patients with atrial fibrillation versus long-term warfarin therapy; the PREVAIL trial. Journal of the [employer] of Cardiology, Vol. 4, No. 1, 2014, 1-11. - Kar S, Doshi SK, Sadhu A, Horton R, Osorio J et al. Primary outcome evaluation of a next-generation left atrial appendage closure device: results from the PINNACLE FLX trial. Circulation 2021;143(18)1754-1762.      After today's visit with the patient which was dedicated solely for shared decision making visit regarding LAA closure device, the patient decided to think about his options before finalizing a plan about left atrial appendage occlusion.  If he elects to proceed, I would like to obtain a gated CT scan of the chest with contrast timed for PV/LA visualization.    HAS-BLED score 4 Hypertension Yes  Abnormal renal and liver function (Dialysis, transplant, Cr >2.26 mg/dL /Cirrhosis or Bilirubin >2x Normal or AST/ALT/AP >3x Normal) No  Stroke No  Bleeding Yes  Labile INR (Unstable/high INR) No  Elderly (>65) Yes  Drugs or alcohol (? 8 drinks/week, anti-plt or NSAID) Yes    CHA2DS2-VASc Score = 4  The patient's score is based upon: CHF History: 1 HTN History: 1 Diabetes History:  0 Stroke History: 0 Vascular Disease History: 0 Age Score: 2 Gender Score: 0   Plan to start Plavix 75 mg by mouth once daily 1 week prior to the watchman implant date.  After implant we will load him with aspirin and he will continue DAPT for 6 months after implant.     Presents for LAAO today. Procedure reviewed.       Signed, Steffanie Dunn, MD, [facility], [facility] 06/15/2022 Electrophysiology [facility]

## 2022-06-15 NOTE — Plan of Care (Signed)

## 2022-06-15 NOTE — TOC Initial Note (Signed)
Transition of Care [facility]) - Initial/Assessment Note    Patient Details  Name: Carlos Dawson MRN: [id number] Date of Birth: [birth year]  Transition of Care [facility]) CM/SW Contact:    Harriet Masson, RN Phone Number: 06/15/2022, 1:39 PM  Clinical Narrative:                 Patient is active with [facility] for PT services.  MD to add [facility]-PT orders.  No other TOC needs Expected Discharge Plan: Home w Home Health Services Barriers to Discharge: Continued Medical Work up   Patient Goals and CMS Choice Patient states their goals for this hospitalization and ongoing recovery are:: reeturn home CMS Medicare.gov Compare Post Acute Care list provided to:: Patient Choice offered to / list presented to : Patient      Expected Discharge Plan and Services     Post Acute Care Choice: Home Health                             HH Arranged: PT [facility] Agency: [facility] Date [facility] Agency Contacted: 06/15/22 Time HH Agency Contacted: [id number] Representative spoke with at [facility] Agency: Morrie Sheldon  Prior Living Arrangements/Services     Patient language and need for interpreter reviewed:: Yes Do you feel safe going back to the place where you live?: Yes      Need for Family Participation in Patient Care: Yes (Comment) Care giver support system in place?: Yes (comment)   Criminal Activity/Legal Involvement Pertinent to Current Situation/Hospitalization: No - Comment as needed  Activities of Daily Living Home Assistive Devices/Equipment: CPAP, Eyeglasses, Cane (specify quad or straight) ADL Screening (condition at time of admission) Patient's cognitive ability adequate to safely complete daily activities?: Yes Is the patient deaf or have difficulty hearing?: Yes (Bil aids) Does the patient have difficulty seeing, even when wearing glasses/contacts?: No Does the patient have difficulty concentrating, remembering, or making decisions?: No Patient able to  express need for assistance with ADLs?: Yes Does the patient have difficulty dressing or bathing?: No Independently performs ADLs?: Yes (appropriate for developmental age) Does the patient have difficulty walking or climbing stairs?: Yes Weakness of Legs: Both Weakness of Arms/Hands: None  Permission Sought/Granted                  Emotional Assessment         Alcohol / Substance Use: Not Applicable Psych Involvement: No (comment)  Admission diagnosis:  Atrial fibrillation (HCC) [I48.91] Patient Active Problem List   Diagnosis Date Noted   Atrial fibrillation (HCC) 06/15/2022   Presence of Watchman left atrial appendage closure device 06/15/2022   Chest pain of uncertain etiology 06/15/2022   Diverticulitis of large intestine without perforation or abscess without bleeding 09/08/2021   Unspecified asthma, uncomplicated 09/08/2021   Carpal tunnel syndrome of right wrist 05/20/2021   Pain in right hand 04/04/2021   Arthritis of hand 04/04/2021   Anticoagulated by anticoagulation treatment 08/16/2020   Abnormal feces 05/27/2020   Acquired thrombophilia (HCC) 05/27/2020   Air embolism following infusion, transfusion and therapeutic injection, initial encounter 05/27/2020   Anxiety state 05/27/2020   Benign prostatic hyperplasia without lower urinary tract symptoms 05/27/2020   Cardiac arrhythmia 05/27/2020   Chronic kidney disease, stage 3 unspecified (HCC) 05/27/2020   Cutaneous abscess of limb, unspecified 05/27/2020   Deltoid tendinitis 05/27/2020   Diarrhea due to drug 05/27/2020   Enlarged prostate 05/27/2020   Functional  diarrhea 05/27/2020   History of malignant neoplasm of prostate 05/27/2020   Impaired fasting glucose 05/27/2020   Insomnia 05/27/2020   Iron deficiency anemia 05/27/2020   Left lower quadrant pain 05/27/2020   Anemia 05/27/2020   Microcytic anemia 05/27/2020   Myalgia 05/27/2020   Nasal congestion 05/27/2020   Other ill-defined and unknown  causes of morbidity and mortality 05/27/2020   Pain in joint, ankle and foot 05/27/2020   Personal history of colonic polyps 05/27/2020   Vitamin D deficiency 05/27/2020   Sacroiliac joint dysfunction 06/17/2019   Status post total replacement of right hip 10/18/2018   Unilateral primary osteoarthritis, right hip 03/26/2018   Hemoptysis 02/08/2018   Atrial fibrillation with rapid ventricular response (HCC) 04/15/2017   SOB (shortness of breath)    Sleep apnea    Prostate CA (HCC)    Pneumonia    PAF (paroxysmal atrial fibrillation) (HCC)    Mild intermittent asthma    IBS (irritable bowel syndrome)    Hearing loss    GERD (gastroesophageal reflux disease)    Generalized headaches    Diastolic dysfunction    Contact lens/glasses fitting    Diabetes mellitus without complication (HCC)    Colonic polyp    Heart failure (HCC)    ASCVD (arteriosclerotic cardiovascular disease)    Ascending aortic aneurysm (HCC)    Aortic root enlargement (HCC)    S/P lumbar spinal fusion 10/18/2016   Polyneuropathy 09/18/2016   Chest pain 09/15/2016   Paroxysmal atrial fibrillation with RVR (HCC) 09/15/2016   Hypotension 09/15/2016   S/P lumbar laminectomy 05/04/2016   Oral candidiasis 03/30/2016   Pain in right hip 03/29/2016   Lumbar radiculopathy 03/29/2016   Post laminectomy syndrome 03/29/2016   Carpal tunnel syndrome 07/29/2015   Severe obesity (BMI >= 40) (HCC) 04/26/2015   Asthma with acute exacerbation 04/22/2015   Hx of pulmonary embolus 09/11/2014   CAP (community acquired pneumonia) 08/12/2013   Encounter for therapeutic drug monitoring 03/03/2013   Chest pain at rest 01/06/2013   Chronic diastolic heart failure (HCC) 12/26/2012   HLD (hyperlipidemia) 12/26/2012   Allergic rhinitis 12/24/2012   Urge incontinence of urine 10/07/2012   Long term (current) use of anticoagulants 08/29/2012   Lapband APL May 2009 10/25/2011   Hypoxemia 12/08/2010   Dyspnea 12/08/2010   ED  (erectile dysfunction) of organic origin 11/28/2010   Elevated prostate specific antigen (PSA) 11/28/2010   Hypertension    Obesities, morbid (HCC)    Pulmonary embolism (HCC)    Anticoagulant long-term use    CAD (coronary artery disease)    Thoracic aortic aneurysm (HCC)    OA (osteoarthritis)    LVH (left ventricular hypertrophy)    Paroxysmal atrial fibrillation (HCC)    Mild persistent asthma, well controlled 10/21/2008   OSA (obstructive sleep apnea) 08/06/2008   PCP:  Thana Ates, MD Pharmacy:   [facility] [phone] - [place] - [address] AT [address] [facility] [address] [place] [address] Phone: [phone] Fax: [phone]     Social Determinants of Health (SDOH) Social History: SDOH Screenings   Food Insecurity: No Food Insecurity (06/15/2022)  Housing: Low Risk  (06/15/2022)  Transportation Needs: No Transportation Needs (06/15/2022)  Utilities: Not At Risk (06/15/2022)  Tobacco Use: Medium Risk (06/15/2022)   SDOH Interventions:     Readmission Risk Interventions     No data to display

## 2022-06-15 NOTE — Anesthesia Postprocedure Evaluation (Signed)
Anesthesia Post Note  Patient: TAVARION STONEBACK  Procedure(s) Performed: LEFT ATRIAL APPENDAGE OCCLUSION TRANSESOPHAGEAL ECHOCARDIOGRAM     Patient location during evaluation: Cath Lab Anesthesia Type: General Level of consciousness: awake and alert, patient cooperative and oriented Pain management: pain level controlled Vital Signs Assessment: post-procedure vital signs reviewed and stable Respiratory status: spontaneous breathing, nonlabored ventilation and respiratory function stable Cardiovascular status: blood pressure returned to baseline and stable Postop Assessment: no apparent nausea or vomiting Anesthetic complications: yes   Encounter Notable Events  Notable Event Outcome Phase Comment  Difficult to intubate - expected  Intraprocedure Filed from anesthesia note documentation.    Last Vitals:  Vitals:   06/15/22 0955 06/15/22 1000  BP: (!) 98/56 (!) 100/57  Pulse: (!) 58 63  Resp: 14 14  Temp:    SpO2: 92% 93%    Last Pain:  Vitals:   06/15/22 0945  TempSrc: Temporal  PainSc:                  Carlos Dawson,E. Caterina [place]ine

## 2022-06-15 NOTE — Transfer of Care (Signed)
Immediate Anesthesia Transfer of Care Note  Patient: Carlos Dawson  Procedure(s) Performed: LEFT ATRIAL APPENDAGE OCCLUSION TRANSESOPHAGEAL ECHOCARDIOGRAM  Patient Location: Cath Lab  Anesthesia Type:General  Level of Consciousness: awake, alert , and oriented  Airway & Oxygen Therapy: Patient Spontanous Breathing and Patient connected to nasal cannula oxygen  Post-op Assessment: Report given to RN and Post -op Vital signs reviewed and stable  Post vital signs: Reviewed and stable  Last Vitals:  Vitals Value Taken Time  BP 104/68 06/15/22 0911  Temp 36.2 C 06/15/22 0908  Pulse 56 06/15/22 0912  Resp 17 06/15/22 0912  SpO2 94 % 06/15/22 0912  Vitals shown include unvalidated device data.  Last Pain:  Vitals:   06/15/22 0908  TempSrc: Temporal  PainSc: 0-No pain      Patients Stated Pain Goal: 2 (06/15/22 1610)  Complications:  Encounter Notable Events  Notable Event Outcome Phase Comment  Difficult to intubate - expected  Intraprocedure Filed from anesthesia note documentation.

## 2022-06-15 NOTE — Anesthesia Procedure Notes (Signed)
Procedure Name: Intubation Date/Time: 06/15/2022 7:53 AM  Performed by: Cheree Ditto, CRNAPre-anesthesia Checklist: Patient identified, Emergency Drugs available, Suction available and Patient being monitored Patient Re-evaluated:Patient Re-evaluated prior to induction Oxygen Delivery Method: Circle system utilized Preoxygenation: Pre-oxygenation with 100% oxygen Induction Type: IV induction Ventilation: Mask ventilation without difficulty Laryngoscope Size: Glidescope and 4 Grade View: Grade II Tube type: Oral Number of attempts: 1 Airway Equipment and Method: Stylet and Oral airway Placement Confirmation: ETT inserted through vocal cords under direct vision, positive ETCO2 and breath sounds checked- equal and bilateral Secured at: 22 cm Tube secured with: Tape Dental Injury: Teeth and Oropharynx as per pre-operative assessment  Difficulty Due To: Difficulty was anticipated and Difficult Airway- due to reduced neck mobility

## 2022-06-15 NOTE — Discharge Summary (Signed)
[facility]    Patient ID: Carlos Dawson,  MRN: [id number], DOB/AGE: March 21, 1940 82 y.o.  Admit date: 06/15/2022 Discharge date: 06/15/2022  Primary Care Physician: Thana Ates, MD  Primary Cardiologist: Peter [name], MD  Electrophysiologist: Lanier Prude, MD  Primary Discharge Diagnosis:  Paroxysmal Atrial Fibrillation Poor candidacy for long term anticoagulation due to  hematuria  Procedures This Admission:  Transeptal Puncture Intra-procedural TEE which showed no LAA thrombus Left atrial appendage occlusive device placement on 06/15/22 by Dr. Lalla Brothers.   Brief HPI: Carlos Dawson is a 82 y.o. male with a history of CAD s/p BMS to 2nd OM 2012, chronic diastolic CHF, prior PE, HTN, HLD, DM2, thoracic aneurysm, and PAF who was referred for LAAO closure secondary to hematuria.    Carlos Dawson has been followed by Dr. [name] for his cardiology care. He underwent AF ablation with Dr. Johney Dawson in 2019 and has since been having issue with hematuria with chronic anticoagulation. He was recently hospitalized 03/24/22 with recurrent hematuria after being seen by urology with foley cathter placement. It was recommended that he stop his Xarelto at that time. Given this, he was referred to Dr. Lalla Brothers for LAAO closure. In consultation, he was felt to be a good candidate and plan with to use CT performed prior to AF ablation. He will start Plavix 75mg  QD one week prior to procedure. We will plan to load the patient with ASA 325mg  then he will continue DAPT with ASA and Plavix for 6 month duration.    Hospital Course:  The patient was admitted and underwent left atrial appendage occlusive device placement with a Watchman FLX Pro 24mm device. He was monitored post procedure and has done very well therefore he is being considered for same day discharge later today. Groin site has been without complication. Wound care and restrictions were reviewed with the patient. The patient has  been scheduled for post procedure follow up with Georgie Chard, NP in approximately  1 month. He was started on Plavix one week prior to LAAO closure. Post procedure, he was loaded with ASA 325mg  x1 and he will start DAPT with ASA 81mg  PO QD along with Plavix 75mg  PO QD tomorrow 06/16/22 and continue through 6 months (12/16/22). He will require dental SBE during that time. A repeat CT scan will be performed at approximately 60 days to ensure proper seal of the device.   Patient had an episode of chest pressure in the post procedure setting. No other associated symptoms. EKG performed that showed no acute changes. Patient given Tylenol with complete relief.   Physical Exam: Vitals:   06/15/22 0950 06/15/22 0955 06/15/22 1000 06/15/22 1048  BP: (!) 95/56 (!) 98/56 (!) 100/57 112/72  Pulse: 64 (!) 58 63 60  Resp: 16 14 14 15   Temp:    97.6 F (36.4 C)  TempSrc:      SpO2: 93% 92% 93% 95%  Weight:      Height:       General: Well developed, well nourished, NAD Lungs:Clear to ausculation bilaterally. No wheezes, rales, or rhonchi. Breathing is unlabored. Cardiovascular: RRR with S1 S2. No murmurs Extremities: No edema. Groin site stable. Neuro: Alert and oriented. No focal deficits. No facial asymmetry. MAE spontaneously. Psych: Responds to questions appropriately with normal affect.    Labs:   Lab Results  Component Value Date   WBC 5.3 06/12/2022   HGB 9.7 (L) 06/12/2022   HCT 29.6 (L) 06/12/2022  MCV 84 06/12/2022   PLT 139 (L) 06/12/2022    Recent Labs  Lab 06/12/22 1036  NA 142  K 4.1  CL 108*  CO2 20  BUN 22  CREATININE 1.17  CALCIUM 8.6  GLUCOSE 138*   Discharge Medications:  Allergies as of 06/15/2022       Reactions   Other    Other reaction(s): cough, Other Other reaction(s): cough Other reaction(s): wt gain   Lotensin [benazepril] Cough   Tofranil [imipramine]    Weight gain   Ace Inhibitors Cough   Adhesive [tape] Itching, Rash   Amoxicillin-pot  Clavulanate Other (See Comments)   GI Upset (intolerance)   Codeine Nausea Only   Latex Itching, Rash, Other (See Comments)   Bandaids   Metformin Diarrhea   Morphine Itching   Nifedipine Other (See Comments)   Other reaction(s): leg edema   Quinolones Other (See Comments)   Patient was warned about not using Cipro and similar antibiotics. Recent studies have raised concern that fluoroquinolone antibiotics could be associated with an increased risk of aortic aneurysm Fluoroquinolones have non-antimicrobial properties that might jeopardise the integrity of the extracellular matrix of the vascular wall In a  propensity score matched cohort study in [place], there was a 66% increased rate of aortic aneurysm or dissection associated with oral fluoroquinolone use, compared wit        Medication List     TAKE these medications    acetaminophen 500 MG tablet Commonly known as: TYLENOL Take 1,000 mg by mouth every 8 (eight) hours as needed for mild pain or headache.   albuterol 108 (90 Base) MCG/ACT inhaler Commonly known as: VENTOLIN HFA Inhale 2 puffs into the lungs every 4 (four) hours as needed for wheezing or shortness of breath.   aspirin EC 81 MG tablet Take 1 tablet (81 mg total) by mouth daily. Swallow whole.   baclofen 10 MG tablet Commonly known as: LIORESAL Take 10 mg by mouth 3 (three) times daily as needed for muscle spasms.   budesonide-formoterol 160-4.5 MCG/ACT inhaler Commonly known as: SYMBICORT Inhale 2 puffs into the lungs daily as needed (Asthma).   clopidogrel 75 MG tablet Commonly known as: PLAVIX Take 1 tablet (75 mg total) by mouth daily.   dextromethorphan-guaiFENesin 30-600 MG 12hr tablet Commonly known as: MUCINEX DM Take 2 tablets by mouth as needed for cough (Cold).   diphenoxylate-atropine 2.5-0.025 MG tablet Commonly known as: LOMOTIL Take 1 tablet by mouth 4 (four) times daily as needed for diarrhea or loose stools.   eucerin cream Apply  1 application topically as needed for dry skin.   fluticasone 50 MCG/ACT nasal spray Commonly known as: FLONASE Place 1 spray into both nostrils as needed for allergies or rhinitis.   furosemide 80 MG tablet Commonly known as: LASIX Take 1 tablet (80 mg total) by mouth daily. What changed: Another medication with the same name was changed. Make sure you understand how and when to take each.   furosemide 40 MG tablet Commonly known as: LASIX TAKE ONE TABLET (40MG ) BY MOUTH EACH DAY AS NEEDED FOR SWELLING OR WEIGHT GAIN. What changed: See the new instructions.   Iron (Ferrous Sulfate) 75 (15 Fe) MG/ML Soln Take 75 mg by mouth every other day.   Jardiance 25 MG Tabs tablet Generic drug: empagliflozin Take 25 mg by mouth daily.   losartan 25 MG tablet Commonly known as: COZAAR TAKE 1 TABLET BY MOUTH EVERY DAY   methocarbamol 500 MG tablet Commonly known as:  ROBAXIN Take 1 tablet (500 mg total) by mouth every 8 (eight) hours as needed for muscle spasms.   metoprolol tartrate 25 MG tablet Commonly known as: LOPRESSOR Take 12.5 mg by mouth 2 (two) times daily.   nitroGLYCERIN 0.4 MG SL tablet Commonly known as: NITROSTAT Place 1 tablet (0.4 mg total) under the tongue every 5 (five) minutes as needed for chest pain.   pantoprazole 40 MG tablet Commonly known as: PROTONIX TAKE ONE TABLET BY MOUTH BEFORE BREAKFAST (TAKE ON AN EMPTY STOMACH 30 MINUTES PRIOR TO A MEAL)   potassium chloride SA 20 MEQ tablet Commonly known as: Klor-Con M20 Taking two tablet by mouth in the am and 1 tablet in the evening   psyllium 58.6 % packet Commonly known as: METAMUCIL Take 1 packet by mouth daily as needed (Looose stool).   sildenafil 100 MG tablet Commonly known as: VIAGRA Take 1 tablet (100 mg total) by mouth as needed for erectile dysfunction.   spironolactone 25 MG tablet Commonly known as: ALDACTONE Take 1 tablet (25 mg total) by mouth daily.   SYSTANE OP Place 1 drop into both  eyes daily as needed (for dry eyes).   tamsulosin 0.4 MG Caps capsule Commonly known as: FLOMAX Take 0.4 mg by mouth every evening.   topiramate 25 MG capsule Commonly known as: TOPAMAX Take 50 mg by mouth 2 (two) times daily.        Disposition:  Home  Discharge Instructions     Call MD for:  difficulty breathing, headache or visual disturbances   Complete by: As directed    Call MD for:  extreme fatigue   Complete by: As directed    Call MD for:  hives   Complete by: As directed    Call MD for:  persistant dizziness or light-headedness   Complete by: As directed    Call MD for:  persistant nausea and vomiting   Complete by: As directed    Call MD for:  redness, tenderness, or signs of infection (pain, swelling, redness, odor or green/yellow discharge around incision site)   Complete by: As directed    Call MD for:  severe uncontrolled pain   Complete by: As directed    Call MD for:  temperature >100.4   Complete by: As directed    Diet - low sodium heart healthy   Complete by: As directed    Discharge instructions   Complete by: As directed    [facility] Procedure, Care After  Procedure MD: Dr. Isidoro Donning Clinical Coordinator: Karsten Fells, RN  This sheet gives you information about how to care for yourself after your procedure. Your health care provider may also give you more specific instructions. If you have problems or questions, contact your health care provider.  What can I expect after the procedure? After the procedure, it is common to have: Bruising around your puncture site. Tenderness around your puncture site. Tiredness (fatigue).  Medication instructions It is very important to continue to take your blood thinner as directed by your doctor after the Watchman procedure. Call your procedure doctor's office with question or concerns. If you are on Coumadin (warfarin), you will have your INR checked the week after your procedure, with a goal INR of 2.0  - 3.0. Please follow your medication instructions on your discharge summary. Only take the medications listed on your discharge paperwork.  Follow up You will be seen in 1 month after your procedure You will have a repeat CT scan approximately  8 weeks after your procedure mark to check your device You will follow up the MD/APP who performed your procedure 6 months after your procedure The Watchman Clinical Coordinator will check in with you from time to time, including 1 and 2 years after your procedure.    Follow these instructions at home: Puncture site care  Follow instructions from your health care provider about how to take care of your puncture site. Make sure you: If present, leave stitches (sutures), skin glue, or adhesive strips in place.  If a large square bandage is present, this may be removed 24 hours after surgery.  Check your puncture site every day for signs of infection. Check for: Redness, swelling, or pain. Fluid or blood. If your puncture site starts to bleed, lie down on your back, apply firm pressure to the area, and contact your health care provider. Warmth. Pus or a bad smell. Driving Do not drive yourself home if you received sedation Do not drive for at least 4 days after your procedure or however long your health care provider recommends. (Do not resume driving if you have previously been instructed not to drive for other health reasons.) Do not spend greater than 1 hour at a time in a car for the first 3 days. Stop and take a break with a 5 minute walk at least every hour.  Do not drive or use heavy machinery while taking prescription pain medicine.  Activity Avoid activities that take a lot of effort, including exercise, for at least 7 days after your procedure. For the first 3 days, avoid sitting for longer than one hour at a time.  Avoid alcoholic beverages, signing paperwork, or participating in legal proceedings for 24 hours after receiving sedation Do  not lift anything that is heavier than 10 lb (4.5 kg) for one week.  No sexual activity for 1 week.  Return to your normal activities as told by your health care provider. Ask your health care provider what activities are safe for you. General instructions Take over-the-counter and prescription medicines only as told by your health care provider. Do not use any products that contain nicotine or tobacco, such as cigarettes and e-cigarettes. If you need help quitting, ask your health care provider. You may shower after 24 hours, but Do not take baths, swim, or use a hot tub for 1 week.  Do not drink alcohol for 24 hours after your procedure. Keep all follow-up visits as told by your health care provider. This is important. Dental Work: You will require antibiotics prior to any dental work, including cleanings, for 6 months after your Watchman implantation to help protect you from infection. After 6 months, antibiotics are no longer required. Contact a health care provider if: You have redness, mild swelling, or pain around your puncture site. You have soreness in your throat or at your puncture site that does not improve after several days You have fluid or blood coming from your puncture site that stops after applying firm pressure to the area. Your puncture site feels warm to the touch. You have pus or a bad smell coming from your puncture site. You have a fever. You have chest pain or discomfort that spreads to your neck, jaw, or arm. You are sweating a lot. You feel nauseous. You have a fast or irregular heartbeat. You have shortness of breath. You are dizzy or light-headed and feel the need to lie down. You have pain or numbness in the arm or leg closest to  your puncture site. Get help right away if: Your puncture site suddenly swells. Your puncture site is bleeding and the bleeding does not stop after applying firm pressure to the area. These symptoms may represent a serious problem  that is an emergency. Do not wait to see if the symptoms will go away. Get medical help right away. Call your local emergency services (911 in the [place] Do not drive yourself to the hospital. Summary After the procedure, it is normal to have bruising and tenderness at the puncture site in your groin, neck, or forearm. Check your puncture site every day for signs of infection. Get help right away if your puncture site is bleeding and the bleeding does not stop after applying firm pressure to the area. This is a medical emergency.  This information is not intended to replace advice given to you by your health care provider. Make sure you discuss any questions you have with your health care provider.   Increase activity slowly   Complete by: As directed        Follow-up Information     Filbert Schilder, NP Follow up on 07/19/2022.   Specialty: Cardiology Why: Your appointment is at 9:30am. Please arrive by 9:15am. Contact information: [address] [place] [address] [phone]         [facility] Follow up.   Why: Home health has been arranged. They will contact you within 48hrs post discharge to schedule apt. Contact information: [address] [place] [address] [phone]                 Duration of Discharge Encounter: Greater than 30 minutes including physician time.  Signed, Georgie Chard, NP  06/15/2022 2:25 PM

## 2022-06-16 ENCOUNTER — Encounter (HOSPITAL_COMMUNITY): Payer: Self-pay | Admitting: Cardiology

## 2022-06-16 ENCOUNTER — Telehealth: Payer: Self-pay | Admitting: Cardiology

## 2022-06-16 DIAGNOSIS — E1151 Type 2 diabetes mellitus with diabetic peripheral angiopathy without gangrene: Secondary | ICD-10-CM | POA: Diagnosis not present

## 2022-06-16 DIAGNOSIS — M48061 Spinal stenosis, lumbar region without neurogenic claudication: Secondary | ICD-10-CM | POA: Diagnosis not present

## 2022-06-16 DIAGNOSIS — M963 Postlaminectomy kyphosis: Secondary | ICD-10-CM | POA: Diagnosis not present

## 2022-06-16 DIAGNOSIS — N183 Chronic kidney disease, stage 3 unspecified: Secondary | ICD-10-CM | POA: Diagnosis not present

## 2022-06-16 DIAGNOSIS — R319 Hematuria, unspecified: Secondary | ICD-10-CM | POA: Diagnosis not present

## 2022-06-16 DIAGNOSIS — E1122 Type 2 diabetes mellitus with diabetic chronic kidney disease: Secondary | ICD-10-CM | POA: Diagnosis not present

## 2022-06-16 DIAGNOSIS — K589 Irritable bowel syndrome without diarrhea: Secondary | ICD-10-CM | POA: Diagnosis not present

## 2022-06-16 DIAGNOSIS — H9190 Unspecified hearing loss, unspecified ear: Secondary | ICD-10-CM | POA: Diagnosis not present

## 2022-06-16 DIAGNOSIS — M4802 Spinal stenosis, cervical region: Secondary | ICD-10-CM | POA: Diagnosis not present

## 2022-06-16 DIAGNOSIS — I130 Hypertensive heart and chronic kidney disease with heart failure and stage 1 through stage 4 chronic kidney disease, or unspecified chronic kidney disease: Secondary | ICD-10-CM | POA: Diagnosis not present

## 2022-06-16 DIAGNOSIS — K219 Gastro-esophageal reflux disease without esophagitis: Secondary | ICD-10-CM | POA: Diagnosis not present

## 2022-06-16 DIAGNOSIS — G4700 Insomnia, unspecified: Secondary | ICD-10-CM | POA: Diagnosis not present

## 2022-06-16 DIAGNOSIS — D509 Iron deficiency anemia, unspecified: Secondary | ICD-10-CM | POA: Diagnosis not present

## 2022-06-16 DIAGNOSIS — M4726 Other spondylosis with radiculopathy, lumbar region: Secondary | ICD-10-CM | POA: Diagnosis not present

## 2022-06-16 DIAGNOSIS — I480 Paroxysmal atrial fibrillation: Secondary | ICD-10-CM | POA: Diagnosis not present

## 2022-06-16 DIAGNOSIS — I5032 Chronic diastolic (congestive) heart failure: Secondary | ICD-10-CM | POA: Diagnosis not present

## 2022-06-16 DIAGNOSIS — J4520 Mild intermittent asthma, uncomplicated: Secondary | ICD-10-CM | POA: Diagnosis not present

## 2022-06-16 DIAGNOSIS — E1142 Type 2 diabetes mellitus with diabetic polyneuropathy: Secondary | ICD-10-CM | POA: Diagnosis not present

## 2022-06-16 DIAGNOSIS — E559 Vitamin D deficiency, unspecified: Secondary | ICD-10-CM | POA: Diagnosis not present

## 2022-06-16 DIAGNOSIS — E785 Hyperlipidemia, unspecified: Secondary | ICD-10-CM | POA: Diagnosis not present

## 2022-06-16 DIAGNOSIS — G4733 Obstructive sleep apnea (adult) (pediatric): Secondary | ICD-10-CM | POA: Diagnosis not present

## 2022-06-16 DIAGNOSIS — I2510 Atherosclerotic heart disease of native coronary artery without angina pectoris: Secondary | ICD-10-CM | POA: Diagnosis not present

## 2022-06-16 DIAGNOSIS — M4714 Other spondylosis with myelopathy, thoracic region: Secondary | ICD-10-CM | POA: Diagnosis not present

## 2022-06-16 DIAGNOSIS — Z48812 Encounter for surgical aftercare following surgery on the circulatory system: Secondary | ICD-10-CM | POA: Diagnosis not present

## 2022-06-16 MED FILL — Fentanyl Citrate Preservative Free (PF) Inj 100 MCG/2ML: INTRAMUSCULAR | Qty: 2 | Status: AC

## 2022-06-16 NOTE — Telephone Encounter (Signed)
Message sent to Dr.Jordan for a order. 

## 2022-06-16 NOTE — Telephone Encounter (Signed)
New Message:     Need a verbal order for PT for 2 times a week for 3 week and 1 time a week for 4 weeks.

## 2022-06-16 NOTE — Telephone Encounter (Signed)
  [facility]   [facility] Team  Contacted the patient regarding discharge from [facility] on 06/15/22  The patient understands to follow up with Georgie Chard on 6/26 in preparation for imaging on 7/24.  The patient understands discharge instructions? Yes  The patient understands medications and regimen? Yes   The patient reports groin site looks good with no issues  The patient understands to call with any questions or concerns prior to scheduled visit.

## 2022-06-20 NOTE — Telephone Encounter (Signed)
Spoke to [place] with [employer].Dr.Jordan advised ok to order PT.

## 2022-06-27 DIAGNOSIS — J4520 Mild intermittent asthma, uncomplicated: Secondary | ICD-10-CM | POA: Diagnosis not present

## 2022-06-27 DIAGNOSIS — E1122 Type 2 diabetes mellitus with diabetic chronic kidney disease: Secondary | ICD-10-CM | POA: Diagnosis not present

## 2022-06-27 DIAGNOSIS — I130 Hypertensive heart and chronic kidney disease with heart failure and stage 1 through stage 4 chronic kidney disease, or unspecified chronic kidney disease: Secondary | ICD-10-CM | POA: Diagnosis not present

## 2022-06-27 DIAGNOSIS — I480 Paroxysmal atrial fibrillation: Secondary | ICD-10-CM | POA: Diagnosis not present

## 2022-06-27 DIAGNOSIS — I5032 Chronic diastolic (congestive) heart failure: Secondary | ICD-10-CM | POA: Diagnosis not present

## 2022-06-27 DIAGNOSIS — Z48812 Encounter for surgical aftercare following surgery on the circulatory system: Secondary | ICD-10-CM | POA: Diagnosis not present

## 2022-06-29 DIAGNOSIS — J4520 Mild intermittent asthma, uncomplicated: Secondary | ICD-10-CM | POA: Diagnosis not present

## 2022-06-29 DIAGNOSIS — I130 Hypertensive heart and chronic kidney disease with heart failure and stage 1 through stage 4 chronic kidney disease, or unspecified chronic kidney disease: Secondary | ICD-10-CM | POA: Diagnosis not present

## 2022-06-29 DIAGNOSIS — I480 Paroxysmal atrial fibrillation: Secondary | ICD-10-CM | POA: Diagnosis not present

## 2022-06-29 DIAGNOSIS — I5032 Chronic diastolic (congestive) heart failure: Secondary | ICD-10-CM | POA: Diagnosis not present

## 2022-06-29 DIAGNOSIS — Z48812 Encounter for surgical aftercare following surgery on the circulatory system: Secondary | ICD-10-CM | POA: Diagnosis not present

## 2022-06-29 DIAGNOSIS — E1122 Type 2 diabetes mellitus with diabetic chronic kidney disease: Secondary | ICD-10-CM | POA: Diagnosis not present

## 2022-07-03 ENCOUNTER — Ambulatory Visit (HOSPITAL_COMMUNITY): Payer: No Typology Code available for payment source | Admitting: Physician Assistant

## 2022-07-05 ENCOUNTER — Ambulatory Visit (HOSPITAL_COMMUNITY)
Admission: RE | Admit: 2022-07-05 | Discharge: 2022-07-05 | Disposition: A | Discharge status: Home / Self Care | Payer: Medicare Other | Source: Ambulatory Visit | Attending: Physician Assistant | Admitting: Physician Assistant

## 2022-07-05 ENCOUNTER — Encounter (HOSPITAL_COMMUNITY): Payer: Self-pay | Admitting: Physician Assistant

## 2022-07-05 ENCOUNTER — Other Ambulatory Visit: Payer: Self-pay | Admitting: Cardiology

## 2022-07-05 VITALS — BP 132/68 | HR 58 | Ht 67.0 in | Wt 246.6 lb

## 2022-07-05 DIAGNOSIS — D6869 Other thrombophilia: Secondary | ICD-10-CM

## 2022-07-05 DIAGNOSIS — Z7901 Long term (current) use of anticoagulants: Secondary | ICD-10-CM | POA: Diagnosis not present

## 2022-07-05 DIAGNOSIS — I5032 Chronic diastolic (congestive) heart failure: Secondary | ICD-10-CM | POA: Insufficient documentation

## 2022-07-05 DIAGNOSIS — I4819 Other persistent atrial fibrillation: Secondary | ICD-10-CM | POA: Diagnosis not present

## 2022-07-05 DIAGNOSIS — I2510 Atherosclerotic heart disease of native coronary artery without angina pectoris: Secondary | ICD-10-CM | POA: Insufficient documentation

## 2022-07-05 DIAGNOSIS — I110 Hypertensive heart disease with heart failure: Secondary | ICD-10-CM | POA: Insufficient documentation

## 2022-07-05 MED ORDER — AMOXICILLIN 500 MG PO CAPS: 2000.0000 mg | ORAL_CAPSULE | ORAL | 0 refills | Status: DC

## 2022-07-05 NOTE — Progress Notes (Signed)
Primary Care Physician: Thana Ates, MD Referring Physician: Dr. 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 male with a h/o  persistent afib/flutter with afib ablation  in 04/2017. He has no  complaints today except for  some intermittent swelling of his feet. He reports that he is not compliant  with a low salt diet. "I like salt." He has been told he cat take extra laix as needed by Dr. [name]. He hs not noted any irregular heart beat.  EKG today shows Sinus brady.   F/u in afib clinic, 07/06/21. He reports that he is doing well. EKG shows sinus brady at 48 bpm, however, Kardia strips reviewed and he has HR's in SR in  the 60's to 80's. One strip recently read afib but it was artifact with SR and PC's. He remains on xarelto at the correct dose per crcl cal at 86. No bleeding concerns, but pain clinic one week ago put him on Celebrex 200 mg 2x a day for back  pain 2/2 to arthritis. He states that tylenol nor tramadol  touch his pain. We dicussed with xarelto this could predispose him for a GI bleed and not the best choice to take  daily together. I discussed Watchman with him  and he is interested so he can better manage his pain. In the interim, he will stop daily celebrex.    F/u in afib clinic, 01/03/22. He did see Dr. Lalla Brothers in July  to consider Watchman but he has deferred so far. He is having back surgery end of January and he is thinking he will go ahead with the Watchman after he recoups form that surgery. He thinks he may need surgery on his rt elbow as well. He does have a lot of arthritic pain  and he  would like to be able to use antiinflammatories to help control. His watch often times will show afib but he does not feel bad and does not note any increase in heart rate so I think PAC's probably which is shown on today's EKG with a reading of afib.   Follow up in the AF clinic 07/05/22. Patient reports that he has done well since his last visit. He is s/p Watchman  implant 06/15/22 with Dr Lalla Brothers. He is now on ASA and Plavix. He has not had any afib symptoms.    Today, he denies symptoms of palpitations, chest pain, shortness of breath, orthopnea, PND, lower extremity edema, dizziness, presyncope, syncope, or neurologic sequela. The patient is tolerating medications without difficulties and is otherwise without complaint today.   Past Medical History:  Diagnosis Date   Anticoagulant long-term use    Aortic root enlargement (HCC)    Ascending aortic aneurysm (HCC)    recent scan in October 2012 showing no change; followed by Dr. [name]   ASCVD (arteriosclerotic cardiovascular disease)    Prior BMS to the 2nd OM in September 2012; with repeat cath in October showing patency   CAD (coronary artery disease)    a. s/p BMS to 2nd OM in Sept 2012; b. LexiScan Myoview (12/2012):  Inf infarct; bowel and motion artifact make study difficult to interpret; no ischemia; not gated; Low Risk   CHF (congestive heart failure) (HCC)    no recent issues 10/13/14   Chronic back pain    "all over my back" (05/11/2017)   Colonic polyp    Contact lens/glasses fitting    Diastolic dysfunction  DVT (deep venous thrombosis) (HCC)    ?LLE   Frequent headaches    "probably weekly" (05/11/2017)   Generalized headaches    neck stenosis   GERD (gastroesophageal reflux disease)    Hearing loss    Hearing loss    more so on left   Hemorrhoids    History of stomach ulcers    Hypertension    IBS (irritable bowel syndrome)    LVH (left ventricular hypertrophy)    Mild intermittent asthma    OA (osteoarthritis)    "all over" (05/11/2017)   Obesities, morbid (HCC)    OSA (obstructive sleep apnea)    PSG 03/30/97 AHI 21, BPAP 13/9   OSA on CPAP    PAF (paroxysmal atrial fibrillation) (HCC)    a. on Xarelto b. s/p DCCV in 08/2016; b. Tikosyn failed 04/16/17 with plans for Multaq and possible Afib ablation with Dr. Johney Frame   Pneumonia    'several times" (05/11/2017)    Presence of Watchman left atrial appendage closure device 06/15/2022   24mm Watchman FLX Pro placed by Dr. Lalla Brothers   Prostate CA [facility])    Oncologist  DR. [facility]quelynn [facility] 09/24/14, undetermined tx   prostate; S/P "radiation and hormone injections"   Pulmonary embolism (HCC) 2008   "both lungs"   SOB (shortness of breath)    on excertion   Thoracic aortic aneurysm (HCC)    Aortic Size Index=     5.0    /Body surface area is 2.43 meters squared. = 2.05  < 2.75 cm/m2      4% risk per year 2.75 to 4.25          8% risk per year > 4.25 cm/m2    20% risk per year   Stable aneurysmal dilation of the ascending aorta with maximum AP diameter of 4.8 cm. Stable area of narrowing of the proximal most portion of the descending aorta measuring 2 cm., previously identified as an area of coarctation. No evidence of aortic dissection.  Coronary artery disease.  Normal appearance of the lungs.   Electronically Signed   By: Ted Mcalpine M.D.   On: 10/01/2014 08:50     Type II diabetes mellitus (HCC)    metphormin, average 154 dx 2017   Past Surgical History:  Procedure Laterality Date   ACHILLES TENDON REPAIR Bilateral    AORTIC ARCH ANGIOGRAPHY N/A 03/13/2017   Procedure: AORTIC ARCH ANGIOGRAPHY;  Surgeon: [name], [name], MD;  Location[facility] INVASIVE CV LAB;  Service: Cardiovascular;  Laterality: N/A;   APPENDECTOMY     ATRIAL FIBRILLATION ABLATION  05/11/2017   ATRIAL FIBRILLATION ABLATION N/A 05/11/2017   Procedure: ATRIAL FIBRILLATION ABLATION;  Surgeon: Hillis Range, MD;  Location: [facility] INVASIVE CV LAB;  Service: Cardiovascular;  Laterality: N/A;   BACK SURGERY     "I've had 7 back and 1 neck ORs" (05/11/2017)   BIOPSY  03/14/2018   Procedure: BIOPSY;  Surgeon: Kerin Salen, MD;  Location: [facility] ENDOSCOPY;  Service: Gastroenterology;;  EGD and Colon   CARDIAC CATHETERIZATION  2006   CARPAL TUNNEL RELEASE Bilateral    LEFT   CATARACT EXTRACTION W/ INTRAOCULAR LENS  IMPLANT, BILATERAL Bilateral     CERVICAL SPINE SURGERY  06/02/2010   lower back and neck   COLONOSCOPY N/A 03/14/2018   Procedure: COLONOSCOPY;  Surgeon: Kerin Salen, MD;  Location: [facility] ENDOSCOPY;  Service: Gastroenterology;  Laterality: N/A;   COLONOSCOPY WITH PROPOFOL N/A 12/29/2014   Procedure: COLONOSCOPY WITH PROPOFOL;  Surgeon:  Charolett Bumpers, MD;  Location: [facility]COPY;  Service: Endoscopy;  Laterality: N/A;   CORONARY ANGIOPLASTY WITH STENT PLACEMENT  October 2012   CORONARY STENT PLACEMENT  Sept 2012   2nd OM with BMS   ESOPHAGOGASTRODUODENOSCOPY N/A 03/14/2018   Procedure: ESOPHAGOGASTRODUODENOSCOPY (EGD);  Surgeon: Kerin Salen, MD;  Location: [facility]OSCOPY;  Service: Gastroenterology;  Laterality: N/A;   HEMORROIDECTOMY     LAMINECTOMY  05/30/2012   L 4 L5   LAMINECTOMY WITH POSTERIOR LATERAL ARTHRODESIS LEVEL 3 N/A 10/18/2016   Procedure: Posterior Lateral Fusion - Lumbar One-Four, segmental instrumentation Lumbar One-Five,  decompression,;  Surgeon: Tia Alert, MD;  Location: [facility] OR;  Service: Neurosurgery;  Laterality: N/A;   LAPAROSCOPIC CHOLECYSTECTOMY     LAPAROSCOPIC GASTRIC BANDING     LEFT AND RIGHT HEART CATHETERIZATION WITH CORONARY ANGIOGRAM N/A 05/07/2014   Procedure: LEFT AND RIGHT HEART CATHETERIZATION WITH CORONARY ANGIOGRAM;  Surgeon: [name] [name], MD;  Location: [facility] CATH LAB;  Service: Cardiovascular;  Laterality: N/A;   LEFT ATRIAL APPENDAGE OCCLUSION N/A 06/15/2022   Procedure: LEFT ATRIAL APPENDAGE OCCLUSION;  Surgeon: Lanier Prude, MD;  Location: [facility] INVASIVE CV LAB;  Service: Cardiovascular;  Laterality: N/A;   LEFT HEART CATH AND CORONARY ANGIOGRAPHY N/A 03/13/2017   Procedure: LEFT HEART CATH AND CORONARY ANGIOGRAPHY;  Surgeon: [name], [name], MD;  Location: [facility] INVASIVE CV LAB;  Service: Cardiovascular;  Laterality: N/A;   LUMBAR LAMINECTOMY/DECOMPRESSION MICRODISCECTOMY N/A 05/04/2016   Procedure: Laminectomy and Foraminotomy - Thoracic twelve-Lumbar one -Posterior Fusion Lumbar  one-two;  Surgeon: Tia Alert, MD;  Location: [facility] OR;  Service: Neurosurgery;  Laterality: N/A;   POLYPECTOMY  03/14/2018   Procedure: POLYPECTOMY;  Surgeon: Kerin Salen, MD;  Location: [facility] ENDOSCOPY;  Service: Gastroenterology;;   POSTERIOR LUMBAR FUSION  10/18/2016   SHOULDER OPEN ROTATOR CUFF REPAIR Bilateral    TEE WITHOUT CARDIOVERSION N/A 06/15/2022   Procedure: TRANSESOPHAGEAL ECHOCARDIOGRAM;  Surgeon: Lanier Prude, MD;  Location: [facility] INVASIVE CV LAB;  Service: Cardiovascular;  Laterality: N/A;   TONSILLECTOMY AND ADENOIDECTOMY     TOTAL HIP ARTHROPLASTY Right 10/18/2018   Procedure: RIGHT TOTAL HIP ARTHROPLASTY ANTERIOR APPROACH;  Surgeon: Kathryne Hitch, MD;  Location: [facility] ORS;  Service: Orthopedics;  Laterality: Right;   TRIGGER FINGER RELEASE     LEFT   UVULOPALATOPHARYNGOPLASTY     VASECTOMY      Current Outpatient Medications  Medication Sig Dispense Refill   acetaminophen (TYLENOL) 500 MG tablet Take 1,000 mg by mouth every 8 (eight) hours as needed for mild pain or headache.      albuterol (VENTOLIN HFA) 108 (90 Base) MCG/ACT inhaler Inhale 2 puffs into the lungs every 4 (four) hours as needed for wheezing or shortness of breath. 18 g 1   aspirin EC 81 MG tablet Take 1 tablet (81 mg total) by mouth daily. Swallow whole. 360 tablet 0   baclofen (LIORESAL) 10 MG tablet Take 10 mg by mouth 3 (three) times daily as needed for muscle spasms.     budesonide-formoterol (SYMBICORT) 160-4.5 MCG/ACT inhaler Inhale 2 puffs into the lungs daily as needed (Asthma).     clopidogrel (PLAVIX) 75 MG tablet Take 1 tablet (75 mg total) by mouth daily. 90 tablet 1   dextromethorphan-guaiFENesin (MUCINEX DM) 30-600 MG 12hr tablet Take 2 tablets by mouth as needed for cough (Cold).     diphenoxylate-atropine (LOMOTIL) 2.5-0.025 MG tablet Take 1 tablet by mouth 4 (four) times daily as needed for diarrhea or loose  stools.     fluticasone (FLONASE) 50 MCG/ACT nasal spray Place 1 spray into  both nostrils as needed for allergies or rhinitis.     furosemide (LASIX) 40 MG tablet TAKE ONE TABLET (40MG ) BY MOUTH EACH DAY AS NEEDED FOR SWELLING OR WEIGHT GAIN. (Patient taking differently: Take 40 mg by mouth daily as needed for fluid or edema.) 45 tablet 8   furosemide (LASIX) 80 MG tablet Take 1 tablet (80 mg total) by mouth daily.     JARDIANCE 25 MG TABS tablet Take 25 mg by mouth daily.     losartan (COZAAR) 25 MG tablet TAKE 1 TABLET BY MOUTH EVERY DAY 90 tablet 3   methocarbamol (ROBAXIN) 500 MG tablet Take 1 tablet (500 mg total) by mouth every 8 (eight) hours as needed for muscle spasms. 60 tablet 1   metoprolol tartrate (LOPRESSOR) 25 MG tablet Take 12.5 mg by mouth 2 (two) times daily.     nitroGLYCERIN (NITROSTAT) 0.4 MG SL tablet Place 1 tablet (0.4 mg total) under the tongue every 5 (five) minutes as needed for chest pain. 25 tablet 5   pantoprazole (PROTONIX) 40 MG tablet TAKE ONE TABLET BY MOUTH BEFORE BREAKFAST (TAKE ON AN EMPTY STOMACH 30 MINUTES PRIOR TO A MEAL)     Polyethyl Glycol-Propyl Glycol (SYSTANE OP) Place 1 drop into both eyes daily as needed (for dry eyes).      potassium chloride SA (KLOR-CON M20) 20 MEQ tablet Taking two tablet by mouth in the am and 1 tablet in the evening     psyllium (METAMUCIL) 58.6 % packet Take 1 packet by mouth daily as needed (Looose stool).     sildenafil (VIAGRA) 100 MG tablet Take 1 tablet (100 mg total) by mouth as needed for erectile dysfunction. 10 tablet 6   Skin Protectants, Misc. (EUCERIN) cream Apply 1 application topically as needed for dry skin.     spironolactone (ALDACTONE) 25 MG tablet Take 1 tablet (25 mg total) by mouth daily. 90 tablet 3   tamsulosin (FLOMAX) 0.4 MG CAPS Take 0.4 mg by mouth every evening.      topiramate (TOPAMAX) 25 MG capsule Take 50 mg by mouth 2 (two) times daily.      amoxicillin (AMOXIL) 500 MG capsule Take 4 capsules (2,000 mg total) by mouth as directed. Take 4 tablets 1 hour prior to dental  work, including cleanings. 8 capsule 0   Iron, Ferrous Sulfate, 75 (15 Fe) MG/ML SOLN Take 75 mg by mouth every other day.     No current facility-administered medications for this encounter.    Allergies  Allergen Reactions   Other     Other reaction(s): cough, Other Other reaction(s): cough  Other reaction(s): wt gain   Lotensin [Benazepril] Cough   Tofranil [Imipramine]     Weight gain   Ace Inhibitors Cough   Adhesive [Tape] Itching and Rash   Amoxicillin-Pot Clavulanate Other (See Comments)    GI Upset (intolerance)   Codeine Nausea Only   Latex Itching, Rash and Other (See Comments)    Bandaids   Metformin Diarrhea   Morphine Itching   Nifedipine Other (See Comments)    Other reaction(s): leg edema   Quinolones Other (See Comments)    Patient was warned about not using Cipro and similar antibiotics. Recent studies have raised concern that fluoroquinolone antibiotics could be associated with an increased risk of aortic aneurysm Fluoroquinolones have non-antimicrobial properties that might jeopardise the integrity of the extracellular matrix of the vascular  wall In a  propensity score matched cohort study in [place], there was a 66% increased rate of aortic aneurysm or dissection associated with oral fluoroquinolone use, compared wit    Social History   Socioeconomic History   Marital status: Married    Spouse name: Not on file   Number of children: 3   Years of education: Not on file   Highest education level: Not on file  Occupational History   Occupation: Retired from [employer], corporate: RETIRED  Tobacco Use   Smoking status: Former    Packs/day: 1.50    Years: 30.00    Additional pack years: 0.00    Total pack years: 45.00    Types: Cigarettes    Start date: 1958    Quit date: 01/24/1992    Years since quitting: 30.4   Smokeless tobacco: Never  Vaping Use   Vaping Use: Never used  Substance and Sexual Activity   Alcohol use: No   Drug use: No   Sexual  activity: Not Currently  Other Topics Concern   Not on file  Social History Narrative   Not on file   Social Determinants of Health   Financial Resource Strain: Not on file  Food Insecurity: No Food Insecurity (06/15/2022)   Hunger Vital Sign    Worried About Running Out of Food in the Last Year: Never true    Ran Out of Food in the Last Year: Never true  Transportation Needs: No Transportation Needs (06/15/2022)   PRAPARE - Administrator, Civil Service (Medical): No    Lack of Transportation (Non-Medical): No  Physical Activity: Not on file  Stress: Not on file  Social Connections: Not on file  Intimate Partner Violence: Not At Risk (06/15/2022)   Humiliation, Afraid, Rape, and Kick questionnaire    Fear of Current or Ex-Partner: No    Emotionally Abused: No    Physically Abused: No    Sexually Abused: No    Family History  Problem Relation Age of Onset   Heart disease Mother    Diabetes Mother    Other Mother        stent placement   Emphysema Father 34   Heart attack Sister     ROS- All systems are reviewed and negative except as per the HPI above  Physical Exam: Vitals:   07/05/22 1033  BP: 132/68  Pulse: (!) 58  Weight: 111.9 kg  Height: 5\' 7"  (1.702 m)   Wt Readings from Last 3 Encounters:  07/05/22 111.9 kg  06/15/22 110.2 kg  06/12/22 110.5 kg    GEN- The patient is a well appearing elderly male, alert and oriented x 3 today.   HEENT-head normocephalic, atraumatic, sclera clear, conjunctiva pink, hearing intact, trachea midline. Lungs- Clear to ausculation bilaterally, normal work of breathing Heart- Regular rate and rhythm, occasional ectopic beat, no murmurs, rubs or gallops  GI- soft, NT, ND, + BS Extremities- no clubbing, cyanosis, or edema MS- no significant deformity or atrophy Skin- no rash or lesion Psych- euthymic mood, full affect Neuro- strength and sensation are intact   EKG today demonstrates SR, 1st degree AV block,  frequent PACs Vent. rate 59 BPM PR interval * ms QRS duration 130 ms QT/QTcB 470/465 ms   CHA2DS2-VASc Score = 4  The patient's score is based upon: CHF History: 1 HTN History: 1 Diabetes History: 0 Stroke History: 0 Vascular Disease History: 0 Age Score: 2 Gender Score: 0  ASSESSMENT AND PLAN: 1. Persistent Atrial Fibrillation (ICD10:  I48.19) The patient's CHA2DS2-VASc score is 4, indicating a 4.8% annual risk of stroke.   Patient appears to be maintaining SR.  Continue Lopressor 12.5 mg BID Continue ASA 81 mg daily and clopidogrel 75 mg daily for now.   2. Secondary Hypercoagulable State (ICD10:  D68.69) The patient is at significant risk for stroke/thromboembolism based upon his CHA2DS2-VASc Score of 4.  S/p Watchman implant 06/15/22. He is pending some dental work, antibiotic sent in per structural heart team.   3. HTN Stable, no changes today.  4. CAD No anginal symptoms.   5. Chronic HFpEF Fluid status appears stable.    Follow up with the structural heart team as scheduled.    Jorja Loa PA-C [facility] [address] [place], [place] [address] [phone]

## 2022-07-06 ENCOUNTER — Encounter (HOSPITAL_COMMUNITY): Payer: Self-pay | Admitting: *Deleted

## 2022-07-06 NOTE — Telephone Encounter (Signed)
The patient is at [facility] and they need clearance to resume PT sessions.  He was evaluated in the Afib Clinic yesterday and is doing well.  Clearance faxed.

## 2022-07-07 DIAGNOSIS — M4325 Fusion of spine, thoracolumbar region: Secondary | ICD-10-CM | POA: Diagnosis not present

## 2022-07-07 DIAGNOSIS — M6281 Muscle weakness (generalized): Secondary | ICD-10-CM | POA: Diagnosis not present

## 2022-07-07 DIAGNOSIS — R262 Difficulty in walking, not elsewhere classified: Secondary | ICD-10-CM | POA: Diagnosis not present

## 2022-07-10 ENCOUNTER — Ambulatory Visit: Payer: No Typology Code available for payment source | Admitting: Adult Health

## 2022-07-10 DIAGNOSIS — M4325 Fusion of spine, thoracolumbar region: Secondary | ICD-10-CM | POA: Diagnosis not present

## 2022-07-10 DIAGNOSIS — M6281 Muscle weakness (generalized): Secondary | ICD-10-CM | POA: Diagnosis not present

## 2022-07-10 DIAGNOSIS — R262 Difficulty in walking, not elsewhere classified: Secondary | ICD-10-CM | POA: Diagnosis not present

## 2022-07-11 DIAGNOSIS — D509 Iron deficiency anemia, unspecified: Secondary | ICD-10-CM | POA: Diagnosis not present

## 2022-07-13 DIAGNOSIS — M4325 Fusion of spine, thoracolumbar region: Secondary | ICD-10-CM | POA: Diagnosis not present

## 2022-07-13 DIAGNOSIS — R262 Difficulty in walking, not elsewhere classified: Secondary | ICD-10-CM | POA: Diagnosis not present

## 2022-07-13 DIAGNOSIS — M6281 Muscle weakness (generalized): Secondary | ICD-10-CM | POA: Diagnosis not present

## 2022-07-17 DIAGNOSIS — M6281 Muscle weakness (generalized): Secondary | ICD-10-CM | POA: Diagnosis not present

## 2022-07-17 DIAGNOSIS — M4325 Fusion of spine, thoracolumbar region: Secondary | ICD-10-CM | POA: Diagnosis not present

## 2022-07-17 DIAGNOSIS — R262 Difficulty in walking, not elsewhere classified: Secondary | ICD-10-CM | POA: Diagnosis not present

## 2022-07-18 NOTE — Progress Notes (Signed)
[facility]                                     Cardiology Office Note:    Date:  07/20/2022   ID:  JB KODA, DOB [birth year], MRN [id number]  PCP:  Thana Ates, MD  [facility] Cardiologist:  Peter [name], MD  [facility] Electrophysiologist:  Lanier Prude, MD   Referring MD: Thana Ates, MD   Chief Complaint  Patient presents with   Follow-up    S/p LAAO   History of Present Illness:    Carlos Dawson is a 82 y.o. male with a hx of CAD s/p BMS to 2nd OM 2012, chronic diastolic CHF, prior PE, HTN, HLD, DM2, thoracic aneurysm, and PAF who was referred for LAAO closure secondary to hematuria.    Carlos Dawson has been followed by Dr. [name] for his cardiology care. He underwent AF ablation with Dr. Johney Frame in 2019 and has since been having issue with hematuria with chronic anticoagulation. He was recently hospitalized 03/24/22 with recurrent hematuria after being seen by urology with foley cathter placement. It was recommended that he stop his Xarelto at that time. Given this, he was referred to Dr. Lalla Brothers for LAAO closure. In consultation, he was felt to be a good candidate and plan with to use CT performed prior to AF ablation. He was started on Plavix 75mg  QD one week prior to procedure with a plan to load the patient with ASA 325mg  then he will continue DAPT with ASA and Plavix for 6 month duration (12/16/22).   He is now s/p LAAO with a Watchman FLX Pro 24mm device. He did very well post procedure and was discharged the same day. Today he is here with his wife and reports that he has been doing very well with no specific complains. He reports arm bruising but no acute bleeding noted in stool or urine. He denies chest pain, SOB, palpitations, LE edema, orthopnea, PND, dizziness, or syncope.   Past Medical History:  Diagnosis Date   Anticoagulant long-term use    Aortic root enlargement (HCC)    Ascending aortic aneurysm (HCC)    recent scan in  October 2012 showing no change; followed by Dr. [name]   ASCVD (arteriosclerotic cardiovascular disease)    Prior BMS to the 2nd OM in September 2012; with repeat cath in October showing patency   CAD (coronary artery disease)    a. s/p BMS to 2nd OM in Sept 2012; b. LexiScan Myoview (12/2012):  Inf infarct; bowel and motion artifact make study difficult to interpret; no ischemia; not gated; Low Risk   CHF (congestive heart failure) (HCC)    no recent issues 10/13/14   Chronic back pain    "all over my back" (05/11/2017)   Colonic polyp    Contact lens/glasses fitting    Diastolic dysfunction    DVT (deep venous thrombosis) (HCC)    ?LLE   Frequent headaches    "probably weekly" (05/11/2017)   Generalized headaches    neck stenosis   GERD (gastroesophageal reflux disease)    Hearing loss    Hearing loss    more so on left   Hemorrhoids    History of stomach ulcers    Hypertension    IBS (irritable bowel syndrome)    LVH (left ventricular hypertrophy)    Mild intermittent asthma  OA (osteoarthritis)    "all over" (05/11/2017)   Obesities, morbid (HCC)    OSA (obstructive sleep apnea)    PSG 03/30/97 AHI 21, BPAP 13/9   OSA on CPAP    PAF (paroxysmal atrial fibrillation) (HCC)    a. on Xarelto b. s/p DCCV in 08/2016; b. Tikosyn failed 04/16/17 with plans for Multaq and possible Afib ablation with Dr. Johney Frame   Pneumonia    'several times" (05/11/2017)   Presence of Watchman left atrial appendage closure device 06/15/2022   24mm Watchman FLX Pro placed by Dr. Lalla Brothers   Prostate CA [facility])    Oncologist  DR. Jacquelynn C[facility] dx 09/24/14, undetermined tx   prostate; S/P "radiation and hormone injections"   Pulmonary embolism (HCC) 2008   "both lungs"   SOB (shortness of breath)    on excertion   Thoracic aortic aneurysm (HCC)    Aortic Size Index=     5.0    /Body surface area is 2.43 meters squared. = 2.05  < 2.75 cm/m2      4% risk per year 2.75 to 4.25          8% risk per year  > 4.25 cm/m2    20% risk per year   Stable aneurysmal dilation of the ascending aorta with maximum AP diameter of 4.8 cm. Stable area of narrowing of the proximal most portion of the descending aorta measuring 2 cm., previously identified as an area of coarctation. No evidence of aortic dissection.  Coronary artery disease.  Normal appearance of the lungs.   Electronically Signed   By: Ted Mcalpine M.D.   On: 10/01/2014 08:50     Type II diabetes mellitus (HCC)    metphormin, average 154 dx 2017    Past Surgical History:  Procedure Laterality Date   ACHILLES TENDON REPAIR Bilateral    AORTIC ARCH ANGIOGRAPHY N/A 03/13/2017   Procedure: AORTIC ARCH ANGIOGRAPHY;  Surgeon: [name], [name], MD;  Location: [facility] INVASIVE CV LAB;  Service: Cardiovascular;  Laterality: N/A;   APPENDECTOMY     ATRIAL FIBRILLATION ABLATION  05/11/2017   ATRIAL FIBRILLATION ABLATION N/A 05/11/2017   Procedure: ATRIAL FIBRILLATION ABLATION;  Surgeon: Hillis Range, MD;  Location: [facility] INVASIVE CV LAB;  Service: Cardiovascular;  Laterality: N/A;   BACK SURGERY     "I've had 7 back and 1 neck ORs" (05/11/2017)   BIOPSY  03/14/2018   Procedure: BIOPSY;  Surgeon: Kerin Salen, MD;  Location: [facility] ENDOSCOPY;  Service: Gastroenterology;;  EGD and Colon   CARDIAC CATHETERIZATION  2006   CARPAL TUNNEL RELEASE Bilateral    LEFT   CATARACT EXTRACTION W/ INTRAOCULAR LENS  IMPLANT, BILATERAL Bilateral    CERVICAL SPINE SURGERY  06/02/2010   lower back and neck   COLONOSCOPY N/A 03/14/2018   Procedure: COLONOSCOPY;  Surgeon: Kerin Salen, MD;  Location: [facility] ENDOSCOPY;  Service: Gastroenterology;  Laterality: N/A;   COLONOSCOPY WITH PROPOFOL N/A 12/29/2014   Procedure: COLONOSCOPY WITH PROPOFOL;  Surgeon: Charolett Bumpers, MD;  Location: [facility] ENDOSCOPY;  Service: Endoscopy;  Laterality: N/A;   CORONARY ANGIOPLASTY WITH STENT PLACEMENT  October 2012   CORONARY STENT PLACEMENT  Sept 2012   2nd OM with BMS   ESOPHAGOGASTRODUODENOSCOPY N/A  03/14/2018   Procedure: ESOPHAGOGASTRODUODENOSCOPY (EGD);  Surgeon: Kerin Salen, MD;  Location: [facility];  Service: Gastroenterology;  Laterality: N/A;   HEMORROIDECTOMY     LAMINECTOMY  05/30/2012   L 4 L5   LAMINECTOMY WITH POSTERIOR LATERAL ARTHRODESIS LEVEL 3  N/A 10/18/2016   Procedure: Posterior Lateral Fusion - Lumbar One-Four, segmental instrumentation Lumbar One-Five,  decompression,;  Surgeon: Tia Alert, MD;  Location: [facility] OR;  Service: Neurosurgery;  Laterality: N/A;   LAPAROSCOPIC CHOLECYSTECTOMY     LAPAROSCOPIC GASTRIC BANDING     LEFT AND RIGHT HEART CATHETERIZATION WITH CORONARY ANGIOGRAM N/A 05/07/2014   Procedure: LEFT AND RIGHT HEART CATHETERIZATION WITH CORONARY ANGIOGRAM;  Surgeon: [name] [name], MD;  Location: [facility] CATH LAB;  Service: Cardiovascular;  Laterality: N/A;   LEFT ATRIAL APPENDAGE OCCLUSION N/A 06/15/2022   Procedure: LEFT ATRIAL APPENDAGE OCCLUSION;  Surgeon: Lanier Prude, MD;  Location: [facility] INVASIVE CV LAB;  Service: Cardiovascular;  Laterality: N/A;   LEFT HEART CATH AND CORONARY ANGIOGRAPHY N/A 03/13/2017   Procedure: LEFT HEART CATH AND CORONARY ANGIOGRAPHY;  Surgeon: [name], [name], MD;  Location[facility] INVASIVE CV LAB;  Service: Cardiovascular;  Laterality: N/A;   LUMBAR LAMINECTOMY/DECOMPRESSION MICRODISCECTOMY N/A 05/04/2016   Procedure: Laminectomy and Foraminotomy - Thoracic twelve-Lumbar one -Posterior Fusion Lumbar one-two;  Surgeon: Tia Alert, MD;  Location: [facility] OR;  Service: Neurosurgery;  Laterality: N/A;   POLYPECTOMY  03/14/2018   Procedure: POLYPECTOMY;  Surgeon: Kerin Salen, MD;  Location: [facility];  Service: Gastroenterology;;   POSTERIOR LUMBAR FUSION  10/18/2016   SHOULDER OPEN ROTATOR CUFF REPAIR Bilateral    TEE WITHOUT CARDIOVERSION N/A 06/15/2022   Procedure: TRANSESOPHAGEAL ECHOCARDIOGRAM;  Surgeon: Lanier Prude, MD;  Location: [facility] INVASIVE CV LAB;  Service: Cardiovascular;  Laterality: N/A;   TONSILLECTOMY AND  ADENOIDECTOMY     TOTAL HIP ARTHROPLASTY Right 10/18/2018   Procedure: RIGHT TOTAL HIP ARTHROPLASTY ANTERIOR APPROACH;  Surgeon: Kathryne Hitch, MD;  Location: [facility] ORS;  Service: Orthopedics;  Laterality: Right;   TRIGGER FINGER RELEASE     LEFT   UVULOPALATOPHARYNGOPLASTY     VASECTOMY      Current Medications: Current Meds  Medication Sig   acetaminophen (TYLENOL) 500 MG tablet Take 1,000 mg by mouth every 8 (eight) hours as needed for mild pain or headache.    albuterol (VENTOLIN HFA) 108 (90 Base) MCG/ACT inhaler Inhale 2 puffs into the lungs every 4 (four) hours as needed for wheezing or shortness of breath.   amoxicillin (AMOXIL) 500 MG capsule Take 4 capsules (2,000 mg total) by mouth as directed. Take 4 tablets 1 hour prior to dental work, including cleanings.   aspirin EC 81 MG tablet Take 1 tablet (81 mg total) by mouth daily. Swallow whole.   baclofen (LIORESAL) 10 MG tablet Take 10 mg by mouth 3 (three) times daily as needed for muscle spasms.   budesonide-formoterol (SYMBICORT) 160-4.5 MCG/ACT inhaler Inhale 2 puffs into the lungs daily as needed (Asthma).   clopidogrel (PLAVIX) 75 MG tablet Take 1 tablet (75 mg total) by mouth daily.   dextromethorphan-guaiFENesin (MUCINEX DM) 30-600 MG 12hr tablet Take 2 tablets by mouth as needed for cough (Cold).   diphenoxylate-atropine (LOMOTIL) 2.5-0.025 MG tablet Take 1 tablet by mouth 4 (four) times daily as needed for diarrhea or loose stools.   fluticasone (FLONASE) 50 MCG/ACT nasal spray Place 1 spray into both nostrils as needed for allergies or rhinitis.   furosemide (LASIX) 40 MG tablet TAKE ONE TABLET (40MG ) BY MOUTH EACH DAY AS NEEDED FOR SWELLING OR WEIGHT GAIN. (Patient taking differently: Take 40 mg by mouth daily as needed for fluid or edema.)   Iron, Ferrous Sulfate, 75 (15 Fe) MG/ML SOLN Take 75 mg by mouth every other day.  JARDIANCE 25 MG TABS tablet Take 25 mg by mouth daily.   losartan (COZAAR) 25 MG tablet  TAKE 1 TABLET BY MOUTH EVERY DAY   methocarbamol (ROBAXIN) 500 MG tablet Take 1 tablet (500 mg total) by mouth every 8 (eight) hours as needed for muscle spasms.   metoprolol tartrate (LOPRESSOR) 25 MG tablet Take 12.5 mg by mouth 2 (two) times daily.   nitroGLYCERIN (NITROSTAT) 0.4 MG SL tablet Place 1 tablet (0.4 mg total) under the tongue every 5 (five) minutes as needed for chest pain.   pantoprazole (PROTONIX) 40 MG tablet TAKE ONE TABLET BY MOUTH BEFORE BREAKFAST (TAKE ON AN EMPTY STOMACH 30 MINUTES PRIOR TO A MEAL)   Polyethyl Glycol-Propyl Glycol (SYSTANE OP) Place 1 drop into both eyes daily as needed (for dry eyes).    potassium chloride SA (KLOR-CON M20) 20 MEQ tablet Taking two tablet by mouth in the am and 1 tablet in the evening   psyllium (METAMUCIL) 58.6 % packet Take 1 packet by mouth daily as needed (Looose stool).   sildenafil (VIAGRA) 100 MG tablet Take 1 tablet (100 mg total) by mouth as needed for erectile dysfunction.   Skin Protectants, Misc. (EUCERIN) cream Apply 1 application topically as needed for dry skin.   spironolactone (ALDACTONE) 25 MG tablet Take 1 tablet (25 mg total) by mouth daily.   tamsulosin (FLOMAX) 0.4 MG CAPS Take 0.4 mg by mouth every evening.    topiramate (TOPAMAX) 25 MG capsule Take 50 mg by mouth 2 (two) times daily.      Allergies:   Other, Lotensin [benazepril], Tofranil [imipramine], Ace inhibitors, Adhesive [tape], Amoxicillin-pot clavulanate, Codeine, Latex, Metformin, Morphine, Nifedipine, and Quinolones   Social History   Socioeconomic History   Marital status: Married    Spouse name: Not on file   Number of children: 3   Years of education: Not on file   Highest education level: Not on file  Occupational History   Occupation: Retired from [employer], [employer]: RETIRED  Tobacco Use   Smoking status: Former    Packs/day: 1.50    Years: 30.00    Additional pack years: 0.00    Total pack years: 45.00    Types: Cigarettes    Start  date: 1958    Quit date: 01/24/1992    Years since quitting: 30.5   Smokeless tobacco: Never  Vaping Use   Vaping Use: Never used  Substance and Sexual Activity   Alcohol use: No   Drug use: No   Sexual activity: Not Currently  Other Topics Concern   Not on file  Social History Narrative   Not on file   Social Determinants of Health   Financial Resource Strain: Not on file  Food Insecurity: No Food Insecurity (06/15/2022)   Hunger Vital Sign    Worried About Running Out of Food in the Last Year: Never true    Ran Out of Food in the Last Year: Never true  Transportation Needs: No Transportation Needs (06/15/2022)   PRAPARE - Administrator, Civil Service (Medical): No    Lack of Transportation (Non-Medical): No  Physical Activity: Not on file  Stress: Not on file  Social Connections: Not on file    Family History: The patient's family history includes Diabetes in his mother; Emphysema (age of onset: 38) in his father; Heart attack in his sister; Heart disease in his mother; Other in his mother.  ROS:   Please see the history of present  illness.    All other systems reviewed and are negative.  EKGs/Labs/Other Studies Reviewed:    The following studies were reviewed today:  Watchman 06/15/22:  Procedures This Admission:  Transeptal Puncture Intra-procedural TEE which showed no LAA thrombus Left atrial appendage occlusive device placement on 06/15/22 by Dr. Lalla Brothers.    EKG:  EKG is not ordered today.    Recent Labs: 02/21/2022: B Natriuretic Peptide 121.5 03/24/2022: ALT 9 07/19/2022: BUN 22; Creatinine, Ser 1.03; Hemoglobin 10.5; Platelets 146; Potassium 4.4; Sodium 142   Recent Lipid Panel    Component Value Date/Time   CHOL 118 01/07/2013 0410   TRIG 133 01/07/2013 0410   HDL 40 01/07/2013 0410   CHOLHDL 3.0 01/07/2013 0410   VLDL 27 01/07/2013 0410   LDLCALC 51 01/07/2013 0410   Physical Exam:    VS:  BP 110/80   Pulse (!) 59   Ht 5\' 7"  (1.702 m)    Wt 246 lb 6.4 oz (111.8 kg)   SpO2 99%   BMI 38.59 kg/m     Wt Readings from Last 3 Encounters:  07/19/22 246 lb 6.4 oz (111.8 kg)  07/05/22 246 lb 9.6 oz (111.9 kg)  06/15/22 243 lb (110.2 kg)    General: Well developed, well nourished, NAD Lungs:Clear to ausculation bilaterally. No wheezes, rales, or rhonchi. Breathing is unlabored. Cardiovascular: RRR with S1 S2. No murmurs Extremities: No edema.  Neuro: Alert and oriented. No focal deficits. No facial asymmetry. MAE spontaneously. Psych: Responds to questions appropriately with normal affect.    ASSESSMENT/PLAN:    PAF: s/p AF ablation and underwent LAAO with Watchman 24mm FLX Pro device.  He was started on Plavix 75mg  QD one week prior to procedure and was loaded with ASA 325 at the time of implant. He will continue DAPT with ASA and Plavix through 6 months post implant (12/16/22). He will require dental SBE during that time. Discussed today and Amoxicillin RX provided today. CT instructions reviewed. Obtain BMET today. Plan 6 month follow up with our team.     CAD s/p BMS to 2nd OM 2012: Denies anginal symptoms. Continue DAPT in the post Watchman setting. Once 6 mont completed, plan to restart ASA monotherapy given CAD.    Prostate cancer s/p radiation: Denies recent hematuria   Chronic diastolic CHF: Appears euvolemic on exam today. Continue Lasix   Prior PE: DAPT post LAAO closure.    HTN: Stable with no changes needed at this time.    Chronic back pain s/p multiple surgeries: Follows with the pain clinic.   Medication Adjustments/Labs and Tests Ordered: Current medicines are reviewed at length with the patient today.  Concerns regarding medicines are outlined above.  Orders Placed This Encounter  Procedures   Basic metabolic panel   CBC   No orders of the defined types were placed in this encounter.   Patient Instructions  Medication Instructions:  Your physician recommends that you continue on your current  medications as directed. Please refer to the Current Medication list given to you today.  *If you need a refill on your cardiac medications before your next appointment, please call your pharmacy*   Lab Work: TODAY: BMET, CBC If you have labs (blood work) drawn today and your tests are completely normal, you will receive your results only by: MyChart Message (if you have MyChart) OR A paper copy in the mail If you have any lab test that is abnormal or we need to change your treatment, we will call you  to review the results.   Testing/Procedures: NONE   Follow-Up: At [facility], you and your health needs are our priority.  As part of our continuing mission to provide you with exceptional heart care, we have created designated Provider Care Teams.  These Care Teams include your primary Cardiologist (physician) and Advanced Practice Providers (APPs -  Physician Assistants and Nurse Practitioners) who all work together to provide you with the care you need, when you need it.  We recommend signing up for the patient portal called "MyChart".  Sign up information is provided on this After Visit Summary.  MyChart is used to connect with patients for Virtual Visits (Telemedicine).  Patients are able to view lab/test results, encounter notes, upcoming appointments, etc.  Non-urgent messages can be sent to your provider as well.   To learn more about what you can do with MyChart, go to [email or web address].    Your next appointment:   KEEP SCHEDULED APPOINTMENT   Signed, Georgie Chard, NP  07/20/2022 1:47 PM    [facility]

## 2022-07-19 ENCOUNTER — Ambulatory Visit: Payer: Medicare Other | Attending: Cardiology | Admitting: Cardiology

## 2022-07-19 VITALS — BP 110/80 | HR 59 | Ht 67.0 in | Wt 246.4 lb

## 2022-07-19 DIAGNOSIS — Z95818 Presence of other cardiac implants and grafts: Secondary | ICD-10-CM | POA: Insufficient documentation

## 2022-07-19 DIAGNOSIS — Z8546 Personal history of malignant neoplasm of prostate: Secondary | ICD-10-CM | POA: Insufficient documentation

## 2022-07-19 DIAGNOSIS — D6869 Other thrombophilia: Secondary | ICD-10-CM | POA: Diagnosis not present

## 2022-07-19 DIAGNOSIS — I480 Paroxysmal atrial fibrillation: Secondary | ICD-10-CM | POA: Insufficient documentation

## 2022-07-19 DIAGNOSIS — I4819 Other persistent atrial fibrillation: Secondary | ICD-10-CM | POA: Diagnosis not present

## 2022-07-19 DIAGNOSIS — Z86711 Personal history of pulmonary embolism: Secondary | ICD-10-CM | POA: Insufficient documentation

## 2022-07-19 LAB — BASIC METABOLIC PANEL
BUN/Creatinine Ratio: 21 (ref 10–24)
Calcium: 8.6 mg/dL (ref 8.6–10.2)
Creatinine, Ser: 1.03 mg/dL (ref 0.76–1.27)

## 2022-07-19 LAB — CBC

## 2022-07-19 NOTE — Patient Instructions (Signed)
Medication Instructions:  Your physician recommends that you continue on your current medications as directed. Please refer to the Current Medication list given to you today.  *If you need a refill on your cardiac medications before your next appointment, please call your pharmacy*   Lab Work: TODAY: BMET, CBC If you have labs (blood work) drawn today and your tests are completely normal, you will receive your results only by: MyChart Message (if you have MyChart) OR A paper copy in the mail If you have any lab test that is abnormal or we need to change your treatment, we will call you to review the results.   Testing/Procedures: NONE   Follow-Up: At [facility], you and your health needs are our priority.  As part of our continuing mission to provide you with exceptional heart care, we have created designated Provider Care Teams.  These Care Teams include your primary Cardiologist (physician) and Advanced Practice Providers (APPs -  Physician Assistants and Nurse Practitioners) who all work together to provide you with the care you need, when you need it.  We recommend signing up for the patient portal called "MyChart".  Sign up information is provided on this After Visit Summary.  MyChart is used to connect with patients for Virtual Visits (Telemedicine).  Patients are able to view lab/test results, encounter notes, upcoming appointments, etc.  Non-urgent messages can be sent to your provider as well.   To learn more about what you can do with MyChart, go to [email or web address].    Your next appointment:   KEEP SCHEDULED APPOINTMENT

## 2022-07-20 DIAGNOSIS — R262 Difficulty in walking, not elsewhere classified: Secondary | ICD-10-CM | POA: Diagnosis not present

## 2022-07-20 DIAGNOSIS — M4325 Fusion of spine, thoracolumbar region: Secondary | ICD-10-CM | POA: Diagnosis not present

## 2022-07-20 DIAGNOSIS — M6281 Muscle weakness (generalized): Secondary | ICD-10-CM | POA: Diagnosis not present

## 2022-07-20 LAB — CBC
Hematocrit: 28.3 % — ABNORMAL LOW (ref 37.5–51.0)
Hemoglobin: 10.5 g/dL — ABNORMAL LOW (ref 13.0–17.7)
MCV: 89 fL (ref 79–97)
Platelets: 146 10*3/uL — ABNORMAL LOW (ref 150–450)
RBC: 3.17 x10E6/uL — ABNORMAL LOW (ref 4.14–5.80)
RDW: 24.6 % — ABNORMAL HIGH (ref 11.6–15.4)
WBC: 5.1 10*3/uL (ref 3.4–10.8)

## 2022-07-20 LAB — BASIC METABOLIC PANEL
BUN: 22 mg/dL (ref 8–27)
CO2: 21 mmol/L (ref 20–29)
Chloride: 109 mmol/L — ABNORMAL HIGH (ref 96–106)
Glucose: 125 mg/dL — ABNORMAL HIGH (ref 70–99)
Potassium: 4.4 mmol/L (ref 3.5–5.2)
Sodium: 142 mmol/L (ref 134–144)
eGFR: 73 mL/min/{1.73_m2} (ref >59)

## 2022-07-25 DIAGNOSIS — R262 Difficulty in walking, not elsewhere classified: Secondary | ICD-10-CM | POA: Diagnosis not present

## 2022-07-25 DIAGNOSIS — M963 Postlaminectomy kyphosis: Secondary | ICD-10-CM | POA: Diagnosis not present

## 2022-07-25 DIAGNOSIS — M6281 Muscle weakness (generalized): Secondary | ICD-10-CM | POA: Diagnosis not present

## 2022-07-25 DIAGNOSIS — M4325 Fusion of spine, thoracolumbar region: Secondary | ICD-10-CM | POA: Diagnosis not present

## 2022-07-25 DIAGNOSIS — M4804 Spinal stenosis, thoracic region: Secondary | ICD-10-CM | POA: Diagnosis not present

## 2022-07-25 DIAGNOSIS — M4714 Other spondylosis with myelopathy, thoracic region: Secondary | ICD-10-CM | POA: Diagnosis not present

## 2022-07-25 DIAGNOSIS — M4726 Other spondylosis with radiculopathy, lumbar region: Secondary | ICD-10-CM | POA: Diagnosis not present

## 2022-07-28 DIAGNOSIS — R262 Difficulty in walking, not elsewhere classified: Secondary | ICD-10-CM | POA: Diagnosis not present

## 2022-07-28 DIAGNOSIS — M4325 Fusion of spine, thoracolumbar region: Secondary | ICD-10-CM | POA: Diagnosis not present

## 2022-07-28 DIAGNOSIS — M6281 Muscle weakness (generalized): Secondary | ICD-10-CM | POA: Diagnosis not present

## 2022-08-03 DIAGNOSIS — I480 Paroxysmal atrial fibrillation: Secondary | ICD-10-CM

## 2022-08-15 ENCOUNTER — Telehealth (HOSPITAL_COMMUNITY): Payer: Self-pay | Admitting: *Deleted

## 2022-08-15 NOTE — Telephone Encounter (Signed)
Attempted to call patient regarding upcoming cardiac CT appointment. °Left message on voicemail with name and callback number ° °Merle Prescott RN Navigator Cardiac Imaging °[facility] °[phone] Office °[phone] Cell ° °

## 2022-08-16 ENCOUNTER — Ambulatory Visit (HOSPITAL_COMMUNITY)
Admission: RE | Admit: 2022-08-16 | Discharge: 2022-08-16 | Disposition: A | Discharge status: Home / Self Care | Payer: Medicare Other | Source: Ambulatory Visit | Attending: Cardiology | Admitting: Cardiology

## 2022-08-16 DIAGNOSIS — I480 Paroxysmal atrial fibrillation: Secondary | ICD-10-CM | POA: Insufficient documentation

## 2022-08-16 DIAGNOSIS — Z95818 Presence of other cardiac implants and grafts: Secondary | ICD-10-CM | POA: Diagnosis not present

## 2022-08-16 MED ORDER — IOHEXOL 350 MG/ML SOLN
100.0000 mL | Freq: Once | INTRAVENOUS | Status: AC | PRN
  Administered 2022-08-16: 100 mL via INTRAVENOUS

## 2022-09-12 ENCOUNTER — Telehealth: Payer: Self-pay | Admitting: Cardiology

## 2022-09-12 DIAGNOSIS — I480 Paroxysmal atrial fibrillation: Secondary | ICD-10-CM

## 2022-09-12 DIAGNOSIS — Z95818 Presence of other cardiac implants and grafts: Secondary | ICD-10-CM

## 2022-09-12 NOTE — Telephone Encounter (Signed)
Patient would like to speak to Carlos Dawson about a fax she was suppose to send, patient states the other doctor has not received it yet.

## 2022-09-13 ENCOUNTER — Telehealth: Payer: Self-pay | Admitting: *Deleted

## 2022-09-13 ENCOUNTER — Ambulatory Visit: Payer: Medicare Other | Attending: Cardiology

## 2022-09-13 ENCOUNTER — Other Ambulatory Visit: Payer: Self-pay | Admitting: Cardiology

## 2022-09-13 DIAGNOSIS — Z95818 Presence of other cardiac implants and grafts: Secondary | ICD-10-CM

## 2022-09-13 DIAGNOSIS — I480 Paroxysmal atrial fibrillation: Secondary | ICD-10-CM

## 2022-09-13 NOTE — Telephone Encounter (Signed)
Carlos Schilder, NP  Cv Div [facility] Triage18 minutes ago (3:42 PM)    He is scheduled for 10/2 at 1045am with me   Carlos Schilder, NP  [facility] [address] Rew minutes ago (3:40 PM)    Lets do the monitor for 2 weeks and I can put him on my schedule. Will be in about 4-6 weeks to allow for monitor to be placed, sent back and read.    Order for 2 week zio xt placed and Georgie Chard NP went ahead and made the pt an appt to see her on 10/25/22 at 1045.  Staff message will be sent to our monitor techs to enroll the pts zio and mail this out to him.    Left the pt a message to call the office back to discuss plan above, per Georgie Chard NP.

## 2022-09-13 NOTE — Telephone Encounter (Signed)
-----   Message from Montrose W sent at 09/13/2022  4:21 PM EDT ----- Regarding: RE: 2 week zio per Georgie Chard NP done ----- Message ----- From: Loa Socks, LPN Sent: 1/61/0960   4:11 PM EDT To: Ernst Bowler; Katrina Claria Dice Subject: 2 week zio per Georgie Chard NP                Noreene Larsson ordered for this pt to get a 2 week zio to assess his afib burden  Order is in.  Can you please enroll the pt?   Thanks friends, Lajoyce Corners

## 2022-09-13 NOTE — Telephone Encounter (Signed)
Patient states go ahead order monitor  as no soon appointments.   Please advise on the amount monitor is needed for.  He would like it to be done in office at [address] and then advise for F/U appt.

## 2022-09-13 NOTE — Telephone Encounter (Signed)
Patient states watchman placed on 5/26.  States last few weeks his apple watch is showing he is in A-fib. He is concerned because it shows this.  Otherwise feels fine  Will send readings through My Chart for review.  Will come with his wife's (brenda Chessman)  name but patients My Chart. If unable then she will bring to office for provider to review

## 2022-09-13 NOTE — Telephone Encounter (Signed)
Patient is calling back requesting to speak with NP Georgie Chard.

## 2022-09-13 NOTE — Progress Notes (Unsigned)
Enrolled for [employer] to mail a ZIO XT long term holter monitor to the patients address on file.   [id number] mailed to patient and applied in office.  Dr. [name] to read.

## 2022-09-18 DIAGNOSIS — I480 Paroxysmal atrial fibrillation: Secondary | ICD-10-CM

## 2022-09-18 DIAGNOSIS — Z95818 Presence of other cardiac implants and grafts: Secondary | ICD-10-CM

## 2022-09-21 ENCOUNTER — Telehealth: Payer: Self-pay | Admitting: Cardiology

## 2022-09-21 NOTE — Telephone Encounter (Signed)
Spoke with the patient and he is currently wearing the heart monitor. Informed patient of his office visit scheduled for 10/2 and he states that he will be there.

## 2022-09-21 NOTE — Telephone Encounter (Signed)
Pt states "it is urgent he speak with Carlos Dawson Dr. Illa Level nurse". Please advise

## 2022-09-21 NOTE — Telephone Encounter (Signed)
Returned call to pt who is wanting to speak with Carlos Dawson Dr. Elvis Coil nurse. Informed pt that Carlos Dawson is not in today. Pt wants Dr. [name] to know his PCP will be calling him to discuss his potassium. She wants to take him off the potassium because his labs were WNL. Pt tried telling her that result was because he takes potassium per Dr. Elvis Coil order.

## 2022-09-21 NOTE — Telephone Encounter (Signed)
Returned call to pt. Went straight to VM. Left the recommendations of Dr. Elvis Coil recommendation.

## 2022-09-22 MED ORDER — POTASSIUM CHLORIDE CRYS ER 20 MEQ PO TBCR: EXTENDED_RELEASE_TABLET | ORAL | 3 refills | Status: DC

## 2022-09-22 NOTE — Addendum Note (Signed)
Addended by: Neoma Laming on: 09/22/2022 04:10 PM   Modules accepted: Orders

## 2022-09-22 NOTE — Telephone Encounter (Signed)
Spoke to patient he stated he is having trouble getting his medications from [place].He is low on Klor Con.Advised I will [facility].

## 2022-09-22 NOTE — Telephone Encounter (Signed)
Called patient left message on personal voice mail to call back. 

## 2022-09-26 DIAGNOSIS — E119 Type 2 diabetes mellitus without complications: Secondary | ICD-10-CM | POA: Diagnosis not present

## 2022-09-26 DIAGNOSIS — Z961 Presence of intraocular lens: Secondary | ICD-10-CM | POA: Diagnosis not present

## 2022-09-28 DIAGNOSIS — E1169 Type 2 diabetes mellitus with other specified complication: Secondary | ICD-10-CM | POA: Diagnosis not present

## 2022-09-28 DIAGNOSIS — Z96641 Presence of right artificial hip joint: Secondary | ICD-10-CM | POA: Diagnosis not present

## 2022-09-28 DIAGNOSIS — M170 Bilateral primary osteoarthritis of knee: Secondary | ICD-10-CM | POA: Diagnosis not present

## 2022-09-28 DIAGNOSIS — M25551 Pain in right hip: Secondary | ICD-10-CM | POA: Diagnosis not present

## 2022-09-28 DIAGNOSIS — E669 Obesity, unspecified: Secondary | ICD-10-CM | POA: Diagnosis not present

## 2022-10-09 DIAGNOSIS — Z95818 Presence of other cardiac implants and grafts: Secondary | ICD-10-CM | POA: Diagnosis not present

## 2022-10-09 DIAGNOSIS — I480 Paroxysmal atrial fibrillation: Secondary | ICD-10-CM | POA: Diagnosis not present

## 2022-10-24 NOTE — Progress Notes (Signed)
Ran Out of Food in the Last Year: Never true  Transportation Needs: No Transportation Needs (06/15/2022)   PRAPARE - Administrator, Civil Service (Medical): No    Lack of Transportation (Non-Medical): No  Physical Activity: Not on file  Stress: Not on file  Social Connections: Unknown (06/05/2021)   Received from [facility], [facility]   Social Network    Social Network: Not on file    Family History: The patient's family history includes Diabetes in his mother; Emphysema (age of onset: 10) in his father; Heart attack in his sister; Heart disease in his mother; Other in his mother.  ROS:   Please see the history of present illness.    All other systems reviewed and are negative.  EKGs/Labs/Other Studies Reviewed:    The following  studies were reviewed today:  Cardiac Studies & Procedures   CARDIAC CATHETERIZATION  CARDIAC CATHETERIZATION 03/13/2017  Narrative  Previously placed Ost 2nd Mrg to 2nd Mrg stent (unknown type) is widely patent.  There is no aortic valve regurgitation.  1. No significant obstructive CAD 2. Aneurysmal thoracic aorta.  Plan: continue medical management . Consider pulmonary evaluation for symptoms of dyspnea.  Findings Coronary Findings Diagnostic  Dominance: Left  Left Main Vessel was injected. Vessel is normal in caliber. Vessel is angiographically normal. The vessel originates from a separate ostium.  Left Anterior Descending Vessel was injected. Vessel is small. Vessel is angiographically normal. There is mild focal disease in the vessel.  Second Septal Branch  Left Circumflex Vessel was injected. Vessel is large. Vessel is angiographically normal.  Second Obtuse Marginal Branch Previously placed Ost 2nd Mrg to 2nd Mrg stent (unknown type) is widely patent.  Right Coronary Artery Vessel was injected. Vessel is small. Vessel is angiographically normal.  Intervention  No interventions have been documented.   STRESS TESTS  MYOCARDIAL PERFUSION IMAGING 03/06/2017  Narrative  The left ventricular ejection fraction is normal (55-65%).  Nuclear stress EF: 61%.  There is a small defect of mild severity present in the basal inferior, mid inferior and apex location. The defect is partially reversible. Overall image quality is poor due to soft tissue attenuation. Cannot rule out a small area of ischemia.  There is evidence of transient ischemic dilatation with a TID of 1.27.  This is an intermediate risk study.  There was no ST segment deviation noted during stress.   ECHOCARDIOGRAM  ECHOCARDIOGRAM COMPLETE 10/26/2022  Narrative ECHOCARDIOGRAM REPORT    Patient Name:   Carlos Dawson Date of Exam: 10/26/2022 Medical Rec #:  [id number]        Height:        67.0 in Accession #:    [id number]       Weight:       247.4 lb Date of Birth:  [birth year]        BSA:          2.214 m Patient Age:    82 years         BP:           102/84 mmHg Patient Gender: M                HR:           53 bpm. Exam Location:  Outpatient  Procedure: 2D Echo, Strain Analysis, 3D Echo, Cardiac Doppler and Color Doppler  Indications:    R07.81 Pleurodynia; R06.9 DOE; R60.0 Lower extremity edema  History:  67.0 in Accession #:    [id number]       Weight:       243.0 lb Date of Birth:  1940-12-04        BSA:          2.197 m Patient Age:    81 years         BP:           121/71 mmHg Patient Gender: M                HR:           49 bpm. Exam Location:  Inpatient  Procedure: Transesophageal Echo, Color Doppler, Cardiac Doppler, Echo Assisted Procedure and 3D Echo  Indications:     Watchman Placement  History:         Patient has prior history of Echocardiogram examinations, most recent 03/31/2022. CHF, CAD, Arrythmias:Atrial Fibrillation, Signs/Symptoms:Dyspnea and Chest Pain; Risk Factors:Hypertension, Dyslipidemia and Diabetes.  Sonographer:     Milbert Coulter Referring Phys:  [id number] Lanier Prude Diagnosing Phys: Thurmon Fair MD  PROCEDURE: After discussion of the risks and benefits of a TEE, an informed consent was obtained from the patient. The patient was intubated. The transesophogeal probe was passed without difficulty through the esophogus of the patient. Imaged were obtained with the patient in a supine position. Sedation performed by different physician. The patient was monitored while under deep sedation. Anesthestetic sedation was provided intravenously by Anesthesiology: 70mg  of Propofol, 20mg  of Lidocaine. Image quality was adequate. The patient's vital signs; including heart rate, blood pressure, and oxygen saturation; remained stable throughout the procedure. The patient developed no complications during the procedure. Transgastric views not obtained: inability to pass probe.   Transesophageal echocardiography including life and reconstructed 3D images was used to guide transseptal  puncture, device delivery and screen for complications. At the end of the procedure there is a well-positioned Watchman flx 24 mm device at the ostium of the left atrial appendage without visible leak and with adequate compression.  IMPRESSIONS   1. Left ventricular ejection fraction, by estimation, is 60 to 65%. The left ventricle has normal function. The left ventricle has no regional wall motion abnormalities. There is mild concentric left ventricular hypertrophy. 2. Right ventricular systolic function is normal. The right ventricular size is normal. There is moderately elevated pulmonary artery systolic pressure. The estimated right ventricular systolic pressure is 47.2 mmHg. 3. Left atrial size was mildly dilated. No left atrial/left atrial appendage thrombus was detected. 4. Right atrial size was mildly dilated. 5. The mitral valve is normal in structure. Trivial mitral valve regurgitation. 6. Tricuspid valve regurgitation is mild to moderate. 7. Unable to measure aortic valve gradients, unable to cross into the stomach with the probe. By planimetry the aortic valve area is around 2.1 cm. Sievers type 0 bicuspid aortic valveUnable to measure aortic valve gradients, unable to cross into the stomach with the probe. By planimetry the aortic valve area is around 2.1 cm. Sievers type 0 bicuspid aortic valve. The aortic valve is bicuspid. Aortic valve regurgitation is not visualized. 8. There is mild coarctation immediately distal to the origin of the left subclavian artery with a minimal aortic diameter of 23 mm, subsequently expanding to a small area of poststenotic dilatation at 31 millimeters. The coarctation does not appear to be hemodynamically important. Aortic dilatation noted. There is moderate dilatation of the aortic root, measuring 47 mm. 9. Mildly dilated pulmonary artery.  FINDINGS Left Ventricle: Left ventricular ejection  67.0 in Accession #:    [id number]       Weight:       243.0 lb Date of Birth:  1940-12-04        BSA:          2.197 m Patient Age:    81 years         BP:           121/71 mmHg Patient Gender: M                HR:           49 bpm. Exam Location:  Inpatient  Procedure: Transesophageal Echo, Color Doppler, Cardiac Doppler, Echo Assisted Procedure and 3D Echo  Indications:     Watchman Placement  History:         Patient has prior history of Echocardiogram examinations, most recent 03/31/2022. CHF, CAD, Arrythmias:Atrial Fibrillation, Signs/Symptoms:Dyspnea and Chest Pain; Risk Factors:Hypertension, Dyslipidemia and Diabetes.  Sonographer:     Milbert Coulter Referring Phys:  [id number] Lanier Prude Diagnosing Phys: Thurmon Fair MD  PROCEDURE: After discussion of the risks and benefits of a TEE, an informed consent was obtained from the patient. The patient was intubated. The transesophogeal probe was passed without difficulty through the esophogus of the patient. Imaged were obtained with the patient in a supine position. Sedation performed by different physician. The patient was monitored while under deep sedation. Anesthestetic sedation was provided intravenously by Anesthesiology: 70mg  of Propofol, 20mg  of Lidocaine. Image quality was adequate. The patient's vital signs; including heart rate, blood pressure, and oxygen saturation; remained stable throughout the procedure. The patient developed no complications during the procedure. Transgastric views not obtained: inability to pass probe.   Transesophageal echocardiography including life and reconstructed 3D images was used to guide transseptal  puncture, device delivery and screen for complications. At the end of the procedure there is a well-positioned Watchman flx 24 mm device at the ostium of the left atrial appendage without visible leak and with adequate compression.  IMPRESSIONS   1. Left ventricular ejection fraction, by estimation, is 60 to 65%. The left ventricle has normal function. The left ventricle has no regional wall motion abnormalities. There is mild concentric left ventricular hypertrophy. 2. Right ventricular systolic function is normal. The right ventricular size is normal. There is moderately elevated pulmonary artery systolic pressure. The estimated right ventricular systolic pressure is 47.2 mmHg. 3. Left atrial size was mildly dilated. No left atrial/left atrial appendage thrombus was detected. 4. Right atrial size was mildly dilated. 5. The mitral valve is normal in structure. Trivial mitral valve regurgitation. 6. Tricuspid valve regurgitation is mild to moderate. 7. Unable to measure aortic valve gradients, unable to cross into the stomach with the probe. By planimetry the aortic valve area is around 2.1 cm. Sievers type 0 bicuspid aortic valveUnable to measure aortic valve gradients, unable to cross into the stomach with the probe. By planimetry the aortic valve area is around 2.1 cm. Sievers type 0 bicuspid aortic valve. The aortic valve is bicuspid. Aortic valve regurgitation is not visualized. 8. There is mild coarctation immediately distal to the origin of the left subclavian artery with a minimal aortic diameter of 23 mm, subsequently expanding to a small area of poststenotic dilatation at 31 millimeters. The coarctation does not appear to be hemodynamically important. Aortic dilatation noted. There is moderate dilatation of the aortic root, measuring 47 mm. 9. Mildly dilated pulmonary artery.  FINDINGS Left Ventricle: Left ventricular ejection  67.0 in Accession #:    [id number]       Weight:       243.0 lb Date of Birth:  1940-12-04        BSA:          2.197 m Patient Age:    81 years         BP:           121/71 mmHg Patient Gender: M                HR:           49 bpm. Exam Location:  Inpatient  Procedure: Transesophageal Echo, Color Doppler, Cardiac Doppler, Echo Assisted Procedure and 3D Echo  Indications:     Watchman Placement  History:         Patient has prior history of Echocardiogram examinations, most recent 03/31/2022. CHF, CAD, Arrythmias:Atrial Fibrillation, Signs/Symptoms:Dyspnea and Chest Pain; Risk Factors:Hypertension, Dyslipidemia and Diabetes.  Sonographer:     Milbert Coulter Referring Phys:  [id number] Lanier Prude Diagnosing Phys: Thurmon Fair MD  PROCEDURE: After discussion of the risks and benefits of a TEE, an informed consent was obtained from the patient. The patient was intubated. The transesophogeal probe was passed without difficulty through the esophogus of the patient. Imaged were obtained with the patient in a supine position. Sedation performed by different physician. The patient was monitored while under deep sedation. Anesthestetic sedation was provided intravenously by Anesthesiology: 70mg  of Propofol, 20mg  of Lidocaine. Image quality was adequate. The patient's vital signs; including heart rate, blood pressure, and oxygen saturation; remained stable throughout the procedure. The patient developed no complications during the procedure. Transgastric views not obtained: inability to pass probe.   Transesophageal echocardiography including life and reconstructed 3D images was used to guide transseptal  puncture, device delivery and screen for complications. At the end of the procedure there is a well-positioned Watchman flx 24 mm device at the ostium of the left atrial appendage without visible leak and with adequate compression.  IMPRESSIONS   1. Left ventricular ejection fraction, by estimation, is 60 to 65%. The left ventricle has normal function. The left ventricle has no regional wall motion abnormalities. There is mild concentric left ventricular hypertrophy. 2. Right ventricular systolic function is normal. The right ventricular size is normal. There is moderately elevated pulmonary artery systolic pressure. The estimated right ventricular systolic pressure is 47.2 mmHg. 3. Left atrial size was mildly dilated. No left atrial/left atrial appendage thrombus was detected. 4. Right atrial size was mildly dilated. 5. The mitral valve is normal in structure. Trivial mitral valve regurgitation. 6. Tricuspid valve regurgitation is mild to moderate. 7. Unable to measure aortic valve gradients, unable to cross into the stomach with the probe. By planimetry the aortic valve area is around 2.1 cm. Sievers type 0 bicuspid aortic valveUnable to measure aortic valve gradients, unable to cross into the stomach with the probe. By planimetry the aortic valve area is around 2.1 cm. Sievers type 0 bicuspid aortic valve. The aortic valve is bicuspid. Aortic valve regurgitation is not visualized. 8. There is mild coarctation immediately distal to the origin of the left subclavian artery with a minimal aortic diameter of 23 mm, subsequently expanding to a small area of poststenotic dilatation at 31 millimeters. The coarctation does not appear to be hemodynamically important. Aortic dilatation noted. There is moderate dilatation of the aortic root, measuring 47 mm. 9. Mildly dilated pulmonary artery.  FINDINGS Left Ventricle: Left ventricular ejection  Ran Out of Food in the Last Year: Never true  Transportation Needs: No Transportation Needs (06/15/2022)   PRAPARE - Administrator, Civil Service (Medical): No    Lack of Transportation (Non-Medical): No  Physical Activity: Not on file  Stress: Not on file  Social Connections: Unknown (06/05/2021)   Received from [facility]   Social Network    Social Network: Not on file    Family History: The patient's family history includes Diabetes in his mother; Emphysema (age of onset: 10) in his father; Heart attack in his sister; Heart disease in his mother; Other in his mother.  ROS:   Please see the history of present illness.    All other systems reviewed and are negative.  EKGs/Labs/Other Studies Reviewed:    The following  studies were reviewed today:  Cardiac Studies & Procedures   CARDIAC CATHETERIZATION  CARDIAC CATHETERIZATION 03/13/2017  Narrative  Previously placed Ost 2nd Mrg to 2nd Mrg stent (unknown type) is widely patent.  There is no aortic valve regurgitation.  1. No significant obstructive CAD 2. Aneurysmal thoracic aorta.  Plan: continue medical management . Consider pulmonary evaluation for symptoms of dyspnea.  Findings Coronary Findings Diagnostic  Dominance: Left  Left Main Vessel was injected. Vessel is normal in caliber. Vessel is angiographically normal. The vessel originates from a separate ostium.  Left Anterior Descending Vessel was injected. Vessel is small. Vessel is angiographically normal. There is mild focal disease in the vessel.  Second Septal Branch  Left Circumflex Vessel was injected. Vessel is large. Vessel is angiographically normal.  Second Obtuse Marginal Branch Previously placed Ost 2nd Mrg to 2nd Mrg stent (unknown type) is widely patent.  Right Coronary Artery Vessel was injected. Vessel is small. Vessel is angiographically normal.  Intervention  No interventions have been documented.   STRESS TESTS  MYOCARDIAL PERFUSION IMAGING 03/06/2017  Narrative  The left ventricular ejection fraction is normal (55-65%).  Nuclear stress EF: 61%.  There is a small defect of mild severity present in the basal inferior, mid inferior and apex location. The defect is partially reversible. Overall image quality is poor due to soft tissue attenuation. Cannot rule out a small area of ischemia.  There is evidence of transient ischemic dilatation with a TID of 1.27.  This is an intermediate risk study.  There was no ST segment deviation noted during stress.   ECHOCARDIOGRAM  ECHOCARDIOGRAM COMPLETE 10/26/2022  Narrative ECHOCARDIOGRAM REPORT    Patient Name:   Carlos Dawson Date of Exam: 10/26/2022 Medical Rec #:  [id number]        Height:        67.0 in Accession #:    [id number]       Weight:       247.4 lb Date of Birth:  [birth year]        BSA:          2.214 m Patient Age:    82 years         BP:           102/84 mmHg Patient Gender: M                HR:           53 bpm. Exam Location:  Outpatient  Procedure: 2D Echo, Strain Analysis, 3D Echo, Cardiac Doppler and Color Doppler  Indications:    R07.81 Pleurodynia; R06.9 DOE; R60.0 Lower extremity edema  History:  67.0 in Accession #:    [id number]       Weight:       243.0 lb Date of Birth:  1940-12-04        BSA:          2.197 m Patient Age:    81 years         BP:           121/71 mmHg Patient Gender: M                HR:           49 bpm. Exam Location:  Inpatient  Procedure: Transesophageal Echo, Color Doppler, Cardiac Doppler, Echo Assisted Procedure and 3D Echo  Indications:     Watchman Placement  History:         Patient has prior history of Echocardiogram examinations, most recent 03/31/2022. CHF, CAD, Arrythmias:Atrial Fibrillation, Signs/Symptoms:Dyspnea and Chest Pain; Risk Factors:Hypertension, Dyslipidemia and Diabetes.  Sonographer:     Milbert Coulter Referring Phys:  [id number] Lanier Prude Diagnosing Phys: Thurmon Fair MD  PROCEDURE: After discussion of the risks and benefits of a TEE, an informed consent was obtained from the patient. The patient was intubated. The transesophogeal probe was passed without difficulty through the esophogus of the patient. Imaged were obtained with the patient in a supine position. Sedation performed by different physician. The patient was monitored while under deep sedation. Anesthestetic sedation was provided intravenously by Anesthesiology: 70mg  of Propofol, 20mg  of Lidocaine. Image quality was adequate. The patient's vital signs; including heart rate, blood pressure, and oxygen saturation; remained stable throughout the procedure. The patient developed no complications during the procedure. Transgastric views not obtained: inability to pass probe.   Transesophageal echocardiography including life and reconstructed 3D images was used to guide transseptal  puncture, device delivery and screen for complications. At the end of the procedure there is a well-positioned Watchman flx 24 mm device at the ostium of the left atrial appendage without visible leak and with adequate compression.  IMPRESSIONS   1. Left ventricular ejection fraction, by estimation, is 60 to 65%. The left ventricle has normal function. The left ventricle has no regional wall motion abnormalities. There is mild concentric left ventricular hypertrophy. 2. Right ventricular systolic function is normal. The right ventricular size is normal. There is moderately elevated pulmonary artery systolic pressure. The estimated right ventricular systolic pressure is 47.2 mmHg. 3. Left atrial size was mildly dilated. No left atrial/left atrial appendage thrombus was detected. 4. Right atrial size was mildly dilated. 5. The mitral valve is normal in structure. Trivial mitral valve regurgitation. 6. Tricuspid valve regurgitation is mild to moderate. 7. Unable to measure aortic valve gradients, unable to cross into the stomach with the probe. By planimetry the aortic valve area is around 2.1 cm. Sievers type 0 bicuspid aortic valveUnable to measure aortic valve gradients, unable to cross into the stomach with the probe. By planimetry the aortic valve area is around 2.1 cm. Sievers type 0 bicuspid aortic valve. The aortic valve is bicuspid. Aortic valve regurgitation is not visualized. 8. There is mild coarctation immediately distal to the origin of the left subclavian artery with a minimal aortic diameter of 23 mm, subsequently expanding to a small area of poststenotic dilatation at 31 millimeters. The coarctation does not appear to be hemodynamically important. Aortic dilatation noted. There is moderate dilatation of the aortic root, measuring 47 mm. 9. Mildly dilated pulmonary artery.  FINDINGS Left Ventricle: Left ventricular ejection  Ran Out of Food in the Last Year: Never true  Transportation Needs: No Transportation Needs (06/15/2022)   PRAPARE - Administrator, Civil Service (Medical): No    Lack of Transportation (Non-Medical): No  Physical Activity: Not on file  Stress: Not on file  Social Connections: Unknown (06/05/2021)   Received from [facility]   Social Network    Social Network: Not on file    Family History: The patient's family history includes Diabetes in his mother; Emphysema (age of onset: 10) in his father; Heart attack in his sister; Heart disease in his mother; Other in his mother.  ROS:   Please see the history of present illness.    All other systems reviewed and are negative.  EKGs/Labs/Other Studies Reviewed:    The following  studies were reviewed today:  Cardiac Studies & Procedures   CARDIAC CATHETERIZATION  CARDIAC CATHETERIZATION 03/13/2017  Narrative  Previously placed Ost 2nd Mrg to 2nd Mrg stent (unknown type) is widely patent.  There is no aortic valve regurgitation.  1. No significant obstructive CAD 2. Aneurysmal thoracic aorta.  Plan: continue medical management . Consider pulmonary evaluation for symptoms of dyspnea.  Findings Coronary Findings Diagnostic  Dominance: Left  Left Main Vessel was injected. Vessel is normal in caliber. Vessel is angiographically normal. The vessel originates from a separate ostium.  Left Anterior Descending Vessel was injected. Vessel is small. Vessel is angiographically normal. There is mild focal disease in the vessel.  Second Septal Branch  Left Circumflex Vessel was injected. Vessel is large. Vessel is angiographically normal.  Second Obtuse Marginal Branch Previously placed Ost 2nd Mrg to 2nd Mrg stent (unknown type) is widely patent.  Right Coronary Artery Vessel was injected. Vessel is small. Vessel is angiographically normal.  Intervention  No interventions have been documented.   STRESS TESTS  MYOCARDIAL PERFUSION IMAGING 03/06/2017  Narrative  The left ventricular ejection fraction is normal (55-65%).  Nuclear stress EF: 61%.  There is a small defect of mild severity present in the basal inferior, mid inferior and apex location. The defect is partially reversible. Overall image quality is poor due to soft tissue attenuation. Cannot rule out a small area of ischemia.  There is evidence of transient ischemic dilatation with a TID of 1.27.  This is an intermediate risk study.  There was no ST segment deviation noted during stress.   ECHOCARDIOGRAM  ECHOCARDIOGRAM COMPLETE 10/26/2022  Narrative ECHOCARDIOGRAM REPORT    Patient Name:   Carlos Dawson Date of Exam: 10/26/2022 Medical Rec #:  [id number]        Height:        67.0 in Accession #:    [id number]       Weight:       247.4 lb Date of Birth:  [birth year]        BSA:          2.214 m Patient Age:    82 years         BP:           102/84 mmHg Patient Gender: M                HR:           53 bpm. Exam Location:  Outpatient  Procedure: 2D Echo, Strain Analysis, 3D Echo, Cardiac Doppler and Color Doppler  Indications:    R07.81 Pleurodynia; R06.9 DOE; R60.0 Lower extremity edema  History:  Ran Out of Food in the Last Year: Never true  Transportation Needs: No Transportation Needs (06/15/2022)   PRAPARE - Administrator, Civil Service (Medical): No    Lack of Transportation (Non-Medical): No  Physical Activity: Not on file  Stress: Not on file  Social Connections: Unknown (06/05/2021)   Received from [facility]   Social Network    Social Network: Not on file    Family History: The patient's family history includes Diabetes in his mother; Emphysema (age of onset: 10) in his father; Heart attack in his sister; Heart disease in his mother; Other in his mother.  ROS:   Please see the history of present illness.    All other systems reviewed and are negative.  EKGs/Labs/Other Studies Reviewed:    The following  studies were reviewed today:  Cardiac Studies & Procedures   CARDIAC CATHETERIZATION  CARDIAC CATHETERIZATION 03/13/2017  Narrative  Previously placed Ost 2nd Mrg to 2nd Mrg stent (unknown type) is widely patent.  There is no aortic valve regurgitation.  1. No significant obstructive CAD 2. Aneurysmal thoracic aorta.  Plan: continue medical management . Consider pulmonary evaluation for symptoms of dyspnea.  Findings Coronary Findings Diagnostic  Dominance: Left  Left Main Vessel was injected. Vessel is normal in caliber. Vessel is angiographically normal. The vessel originates from a separate ostium.  Left Anterior Descending Vessel was injected. Vessel is small. Vessel is angiographically normal. There is mild focal disease in the vessel.  Second Septal Branch  Left Circumflex Vessel was injected. Vessel is large. Vessel is angiographically normal.  Second Obtuse Marginal Branch Previously placed Ost 2nd Mrg to 2nd Mrg stent (unknown type) is widely patent.  Right Coronary Artery Vessel was injected. Vessel is small. Vessel is angiographically normal.  Intervention  No interventions have been documented.   STRESS TESTS  MYOCARDIAL PERFUSION IMAGING 03/06/2017  Narrative  The left ventricular ejection fraction is normal (55-65%).  Nuclear stress EF: 61%.  There is a small defect of mild severity present in the basal inferior, mid inferior and apex location. The defect is partially reversible. Overall image quality is poor due to soft tissue attenuation. Cannot rule out a small area of ischemia.  There is evidence of transient ischemic dilatation with a TID of 1.27.  This is an intermediate risk study.  There was no ST segment deviation noted during stress.   ECHOCARDIOGRAM  ECHOCARDIOGRAM COMPLETE 10/26/2022  Narrative ECHOCARDIOGRAM REPORT    Patient Name:   Carlos Dawson Date of Exam: 10/26/2022 Medical Rec #:  [id number]        Height:        67.0 in Accession #:    [id number]       Weight:       247.4 lb Date of Birth:  [birth year]        BSA:          2.214 m Patient Age:    82 years         BP:           102/84 mmHg Patient Gender: M                HR:           53 bpm. Exam Location:  Outpatient  Procedure: 2D Echo, Strain Analysis, 3D Echo, Cardiac Doppler and Color Doppler  Indications:    R07.81 Pleurodynia; R06.9 DOE; R60.0 Lower extremity edema  [facility]                                     Cardiology Office Note:    Date:  10/27/2022   ID:  DONTREY SNELLGROVE, DOB 11-28-1940, MRN [id number]  PCP:  Thana Ates, MD  [facility] Cardiologist:  Peter [name], MD  [facility] Electrophysiologist:  Lanier Prude, MD   Referring MD: Thana Ates, MD   Chief Complaint  Patient presents with   Follow-up   History of Present Illness:    Carlos Dawson is a 82 y.o. male with a hx of CAD s/p BMS to 2nd OM 2012, chronic diastolic CHF, prior PE, HTN, HLD, DM2, thoracic aneurysm, and PAF s/p LAAO with Watchman and is here for monitor follow up.    Carlos Dawson has been followed by Dr. [name] for his cardiology care. He underwent AF ablation with Dr. Johney Frame in 2019 and has since been having issue with hematuria with chronic anticoagulation. He was recently hospitalized 03/24/22 with recurrent hematuria after being seen by urology with foley cathter placement. It was recommended that he stop his Xarelto at that time. Given this, he was referred to Dr. Lalla Brothers for LAAO closure. CT imaging showed anatomy suitable for implant and underwent LAAO with a Watchman FLX Pro 24mm device on 06/15/2022. He was started on Plavix 75mg  QD one week prior to procedure and was loaded with ASA 325 at the time of implant with plans to continue DAPT with ASA and Plavix through 6 months post implant (12/16/22).   I saw him for one month follow up at which time he was doing well. He then called the office mid-August with reports of AF on his Apple Watch. ZIO monitor  showed NSR with frequent PACs and runs of SVT with one pause lasting 3.6 sec with an ill-defined baseline rhythm not felt to represent atrial fibrillation.   Today he returns for monitor follow and reports no further watch notifications of AF however is most concerned today about his increased DOE with normal activities. Symptoms have worsened over the last several weeks. He has had  several episodes of chest pain as well. Last episode that occurred several days ago woke him from sleep and lasted a few minutes then went away on its own. He has not tried SL NTG despite carrying this in his pocket. No associated symptoms of dizziness, LE edema, orthopnea, nausea, vomiting or diaphoresis. He has a prior history of BMS to OM2 in 2012 with last LHC 02/2017 that showed patent stent and no other CAD. Last echo from LAA procedure with normal EF and no profound abnormalities.   Past Medical History:  Diagnosis Date   Anticoagulant long-term use    Aortic root enlargement (HCC)    Ascending aortic aneurysm (HCC)    recent scan in October 2012 showing no change; followed by Dr. [name]   ASCVD (arteriosclerotic cardiovascular disease)    Prior BMS to the 2nd OM in September 2012; with repeat cath in October showing patency   CAD (coronary artery disease)    a. s/p BMS to 2nd OM in Sept 2012; b. LexiScan Myoview (12/2012):  Inf infarct; bowel and motion artifact make study difficult to interpret; no ischemia; not gated; Low Risk   CHF (congestive heart failure) (HCC)    no recent issues 10/13/14   Chronic back pain    "  67.0 in Accession #:    [id number]       Weight:       243.0 lb Date of Birth:  1940-12-04        BSA:          2.197 m Patient Age:    81 years         BP:           121/71 mmHg Patient Gender: M                HR:           49 bpm. Exam Location:  Inpatient  Procedure: Transesophageal Echo, Color Doppler, Cardiac Doppler, Echo Assisted Procedure and 3D Echo  Indications:     Watchman Placement  History:         Patient has prior history of Echocardiogram examinations, most recent 03/31/2022. CHF, CAD, Arrythmias:Atrial Fibrillation, Signs/Symptoms:Dyspnea and Chest Pain; Risk Factors:Hypertension, Dyslipidemia and Diabetes.  Sonographer:     Milbert Coulter Referring Phys:  [id number] Lanier Prude Diagnosing Phys: Thurmon Fair MD  PROCEDURE: After discussion of the risks and benefits of a TEE, an informed consent was obtained from the patient. The patient was intubated. The transesophogeal probe was passed without difficulty through the esophogus of the patient. Imaged were obtained with the patient in a supine position. Sedation performed by different physician. The patient was monitored while under deep sedation. Anesthestetic sedation was provided intravenously by Anesthesiology: 70mg  of Propofol, 20mg  of Lidocaine. Image quality was adequate. The patient's vital signs; including heart rate, blood pressure, and oxygen saturation; remained stable throughout the procedure. The patient developed no complications during the procedure. Transgastric views not obtained: inability to pass probe.   Transesophageal echocardiography including life and reconstructed 3D images was used to guide transseptal  puncture, device delivery and screen for complications. At the end of the procedure there is a well-positioned Watchman flx 24 mm device at the ostium of the left atrial appendage without visible leak and with adequate compression.  IMPRESSIONS   1. Left ventricular ejection fraction, by estimation, is 60 to 65%. The left ventricle has normal function. The left ventricle has no regional wall motion abnormalities. There is mild concentric left ventricular hypertrophy. 2. Right ventricular systolic function is normal. The right ventricular size is normal. There is moderately elevated pulmonary artery systolic pressure. The estimated right ventricular systolic pressure is 47.2 mmHg. 3. Left atrial size was mildly dilated. No left atrial/left atrial appendage thrombus was detected. 4. Right atrial size was mildly dilated. 5. The mitral valve is normal in structure. Trivial mitral valve regurgitation. 6. Tricuspid valve regurgitation is mild to moderate. 7. Unable to measure aortic valve gradients, unable to cross into the stomach with the probe. By planimetry the aortic valve area is around 2.1 cm. Sievers type 0 bicuspid aortic valveUnable to measure aortic valve gradients, unable to cross into the stomach with the probe. By planimetry the aortic valve area is around 2.1 cm. Sievers type 0 bicuspid aortic valve. The aortic valve is bicuspid. Aortic valve regurgitation is not visualized. 8. There is mild coarctation immediately distal to the origin of the left subclavian artery with a minimal aortic diameter of 23 mm, subsequently expanding to a small area of poststenotic dilatation at 31 millimeters. The coarctation does not appear to be hemodynamically important. Aortic dilatation noted. There is moderate dilatation of the aortic root, measuring 47 mm. 9. Mildly dilated pulmonary artery.  FINDINGS Left Ventricle: Left ventricular ejection  Ran Out of Food in the Last Year: Never true  Transportation Needs: No Transportation Needs (06/15/2022)   PRAPARE - Administrator, Civil Service (Medical): No    Lack of Transportation (Non-Medical): No  Physical Activity: Not on file  Stress: Not on file  Social Connections: Unknown (06/05/2021)   Received from [facility], [facility]   Social Network    Social Network: Not on file    Family History: The patient's family history includes Diabetes in his mother; Emphysema (age of onset: 10) in his father; Heart attack in his sister; Heart disease in his mother; Other in his mother.  ROS:   Please see the history of present illness.    All other systems reviewed and are negative.  EKGs/Labs/Other Studies Reviewed:    The following  studies were reviewed today:  Cardiac Studies & Procedures   CARDIAC CATHETERIZATION  CARDIAC CATHETERIZATION 03/13/2017  Narrative  Previously placed Ost 2nd Mrg to 2nd Mrg stent (unknown type) is widely patent.  There is no aortic valve regurgitation.  1. No significant obstructive CAD 2. Aneurysmal thoracic aorta.  Plan: continue medical management . Consider pulmonary evaluation for symptoms of dyspnea.  Findings Coronary Findings Diagnostic  Dominance: Left  Left Main Vessel was injected. Vessel is normal in caliber. Vessel is angiographically normal. The vessel originates from a separate ostium.  Left Anterior Descending Vessel was injected. Vessel is small. Vessel is angiographically normal. There is mild focal disease in the vessel.  Second Septal Branch  Left Circumflex Vessel was injected. Vessel is large. Vessel is angiographically normal.  Second Obtuse Marginal Branch Previously placed Ost 2nd Mrg to 2nd Mrg stent (unknown type) is widely patent.  Right Coronary Artery Vessel was injected. Vessel is small. Vessel is angiographically normal.  Intervention  No interventions have been documented.   STRESS TESTS  MYOCARDIAL PERFUSION IMAGING 03/06/2017  Narrative  The left ventricular ejection fraction is normal (55-65%).  Nuclear stress EF: 61%.  There is a small defect of mild severity present in the basal inferior, mid inferior and apex location. The defect is partially reversible. Overall image quality is poor due to soft tissue attenuation. Cannot rule out a small area of ischemia.  There is evidence of transient ischemic dilatation with a TID of 1.27.  This is an intermediate risk study.  There was no ST segment deviation noted during stress.   ECHOCARDIOGRAM  ECHOCARDIOGRAM COMPLETE 10/26/2022  Narrative ECHOCARDIOGRAM REPORT    Patient Name:   Carlos Dawson Date of Exam: 10/26/2022 Medical Rec #:  [id number]        Height:        67.0 in Accession #:    [id number]       Weight:       247.4 lb Date of Birth:  [birth year]        BSA:          2.214 m Patient Age:    82 years         BP:           102/84 mmHg Patient Gender: M                HR:           53 bpm. Exam Location:  Outpatient  Procedure: 2D Echo, Strain Analysis, 3D Echo, Cardiac Doppler and Color Doppler  Indications:    R07.81 Pleurodynia; R06.9 DOE; R60.0 Lower extremity edema  History:

## 2022-10-25 ENCOUNTER — Ambulatory Visit: Payer: Medicare Other | Attending: Internal Medicine | Admitting: Cardiology

## 2022-10-25 VITALS — BP 102/84 | HR 51 | Resp 16 | Ht 67.0 in | Wt 247.4 lb

## 2022-10-25 DIAGNOSIS — I480 Paroxysmal atrial fibrillation: Secondary | ICD-10-CM | POA: Insufficient documentation

## 2022-10-25 DIAGNOSIS — Z95818 Presence of other cardiac implants and grafts: Secondary | ICD-10-CM | POA: Diagnosis not present

## 2022-10-25 DIAGNOSIS — C61 Malignant neoplasm of prostate: Secondary | ICD-10-CM | POA: Insufficient documentation

## 2022-10-25 DIAGNOSIS — R0602 Shortness of breath: Secondary | ICD-10-CM | POA: Diagnosis not present

## 2022-10-25 DIAGNOSIS — R911 Solitary pulmonary nodule: Secondary | ICD-10-CM | POA: Insufficient documentation

## 2022-10-25 DIAGNOSIS — I2510 Atherosclerotic heart disease of native coronary artery without angina pectoris: Secondary | ICD-10-CM | POA: Insufficient documentation

## 2022-10-25 DIAGNOSIS — R079 Chest pain, unspecified: Secondary | ICD-10-CM | POA: Diagnosis not present

## 2022-10-25 NOTE — Patient Instructions (Signed)
Medication Instructions:  The current medical regimen is effective;  continue present plan and medications as directed. Please refer to the Current Medication list given to you today. *If you need a refill on your cardiac medications before your next appointment, please call your pharmacy*  Lab Work: NONE If you have labs (blood work) drawn today and your tests are completely normal, you will receive your results only by:   MyChart Message (if you have MyChart) OR  A paper copy in the mail If you have any lab test that is abnormal or we need to change your treatment, we will call you to review the results.  Testing/Procedures: Your physician has requested that you have an URGENT echocardiogram.(ASAP!) Echocardiography is a painless test that uses sound waves to create images of your heart. It provides your doctor with information about the size and shape of your heart and how well your heart's chambers and valves are working. This procedure takes approximately one hour. There are no restrictions for this procedure. Please do NOT wear cologne, perfume, aftershave, or lotions (deodorant is allowed). Please arrive 15 minutes prior to your appointment time.   Follow-Up: At [facility], you and your health needs are our priority.  As part of our continuing mission to provide you with exceptional heart care, we have created designated Provider Care Teams.  These Care Teams include your primary Cardiologist (physician) and Advanced Practice Providers (APPs -  Physician Assistants and Nurse Practitioners) who all work together to provide you with the care you need, when you need it.   Your next appointment:   AS SCHEDULED   Provider:   Edd Fabian, FNP-C @ [facility] OFFICE Georgie Chard, NP

## 2022-10-26 ENCOUNTER — Ambulatory Visit (INDEPENDENT_AMBULATORY_CARE_PROVIDER_SITE_OTHER): Payer: Medicare Other

## 2022-10-26 DIAGNOSIS — R0602 Shortness of breath: Secondary | ICD-10-CM

## 2022-10-26 DIAGNOSIS — R079 Chest pain, unspecified: Secondary | ICD-10-CM

## 2022-10-26 LAB — ECHOCARDIOGRAM COMPLETE
AR max vel: 3.11 cm2
AV Area VTI: 3.05 cm2
AV Area mean vel: 2.81 cm2
AV Mean grad: 9 mm[Hg]
AV Peak grad: 17 mm[Hg]
Ao pk vel: 2.06 m/s
Area-P 1/2: 3.24 cm2
S' Lateral: 3.29 cm

## 2022-10-27 ENCOUNTER — Telehealth: Payer: Self-pay | Admitting: *Deleted

## 2022-10-27 NOTE — Progress Notes (Unsigned)
Cardiology Clinic Note   Patient Name: Carlos Dawson Date of Encounter: 10/30/2022  Primary Care Provider:  Thana Ates, MD Primary Cardiologist:  Peter [name], MD  Patient Profile    Carlos Dawson 82 year old male presents to the clinic today for evaluation of his chest discomfort.  Past Medical History    Past Medical History:  Diagnosis Date   Anticoagulant long-term use    Aortic root enlargement (HCC)    Ascending aortic aneurysm (HCC)    recent scan in October 2012 showing no change; followed by Dr. [name]   ASCVD (arteriosclerotic cardiovascular disease)    Prior BMS to the 2nd OM in September 2012; with repeat cath in October showing patency   CAD (coronary artery disease)    a. s/p BMS to 2nd OM in Sept 2012; b. LexiScan Myoview (12/2012):  Inf infarct; bowel and motion artifact make study difficult to interpret; no ischemia; not gated; Low Risk   CHF (congestive heart failure) (HCC)    no recent issues 10/13/14   Chronic back pain    "all over my back" (05/11/2017)   Colonic polyp    Contact lens/glasses fitting    Diastolic dysfunction    DVT (deep venous thrombosis) (HCC)    ?LLE   Frequent headaches    "probably weekly" (05/11/2017)   Generalized headaches    neck stenosis   GERD (gastroesophageal reflux disease)    Hearing loss    Hearing loss    more so on left   Hemorrhoids    History of stomach ulcers    Hypertension    IBS (irritable bowel syndrome)    LVH (left ventricular hypertrophy)    Mild intermittent asthma    OA (osteoarthritis)    "all over" (05/11/2017)   Obesities, morbid (HCC)    OSA (obstructive sleep apnea)    PSG 03/30/97 AHI 21, BPAP 13/9   OSA on CPAP    PAF (paroxysmal atrial fibrillation) (HCC)    a. on Xarelto b. s/p DCCV in 08/2016; b. Tikosyn failed 04/16/17 with plans for Multaq and possible Afib ablation with Dr. Johney Frame   Pneumonia    'several times" (05/11/2017)   Presence of Watchman left atrial appendage  closure device 06/15/2022   24mm Watchman FLX Pro placed by Dr. Lalla Brothers   Prostate CA [facility])    Oncologist  DR. Jacquelynn C[facility] dx 09/24/14, undetermined tx   prostate; S/P "radiation and hormone injections"   Pulmonary embolism (HCC) 2008   "both lungs"   SOB (shortness of breath)    on excertion   Thoracic aortic aneurysm (HCC)    Aortic Size Index=     5.0    /Body surface area is 2.43 meters squared. = 2.05  < 2.75 cm/m2      4% risk per year 2.75 to 4.25          8% risk per year > 4.25 cm/m2    20% risk per year   Stable aneurysmal dilation of the ascending aorta with maximum AP diameter of 4.8 cm. Stable area of narrowing of the proximal most portion of the descending aorta measuring 2 cm., previously identified as an area of coarctation. No evidence of aortic dissection.  Coronary artery disease.  Normal appearance of the lungs.   Electronically Signed   By: Ted Mcalpine M.D.   On: 10/01/2014 08:50     Type II diabetes mellitus (HCC)    metphormin, average 154 dx 2017  Past Surgical History:  Procedure Laterality Date   ACHILLES TENDON REPAIR Bilateral    AORTIC ARCH ANGIOGRAPHY N/A 03/13/2017   Procedure: AORTIC ARCH ANGIOGRAPHY;  Surgeon: [name], [name], MD;  Location: [facility] INVASIVE CV LAB;  Service: Cardiovascular;  Laterality: N/A;   APPENDECTOMY     ATRIAL FIBRILLATION ABLATION  05/11/2017   ATRIAL FIBRILLATION ABLATION N/A 05/11/2017   Procedure: ATRIAL FIBRILLATION ABLATION;  Surgeon: Hillis Range, MD;  Location: [facility] INVASIVE CV LAB;  Service: Cardiovascular;  Laterality: N/A;   BACK SURGERY     "I've had 7 back and 1 neck ORs" (05/11/2017)   BIOPSY  03/14/2018   Procedure: BIOPSY;  Surgeon: Kerin Salen, MD;  Location: [facility] ENDOSCOPY;  Service: Gastroenterology;;  EGD and Colon   CARDIAC CATHETERIZATION  2006   CARPAL TUNNEL RELEASE Bilateral    LEFT   CATARACT EXTRACTION W/ INTRAOCULAR LENS  IMPLANT, BILATERAL Bilateral    CERVICAL SPINE SURGERY  06/02/2010   lower back  and neck   COLONOSCOPY N/A 03/14/2018   Procedure: COLONOSCOPY;  Surgeon: Kerin Salen, MD;  Location: [facility] ENDOSCOPY;  Service: Gastroenterology;  Laterality: N/A;   COLONOSCOPY WITH PROPOFOL N/A 12/29/2014   Procedure: COLONOSCOPY WITH PROPOFOL;  Surgeon: Charolett Bumpers, MD;  Location: [facility] ENDOSCOPY;  Service: Endoscopy;  Laterality: N/A;   CORONARY ANGIOPLASTY WITH STENT PLACEMENT  October 2012   CORONARY STENT PLACEMENT  Sept 2012   2nd OM with BMS   ESOPHAGOGASTRODUODENOSCOPY N/A 03/14/2018   Procedure: ESOPHAGOGASTRODUODENOSCOPY (EGD);  Surgeon: Kerin Salen, MD;  Location: [facility];  Service: Gastroenterology;  Laterality: N/A;   HEMORROIDECTOMY     LAMINECTOMY  05/30/2012   L 4 L5   LAMINECTOMY WITH POSTERIOR LATERAL ARTHRODESIS LEVEL 3 N/A 10/18/2016   Procedure: Posterior Lateral Fusion - Lumbar One-Four, segmental instrumentation Lumbar One-Five,  decompression,;  Surgeon: Tia Alert, MD;  Location: [facility] OR;  Service: Neurosurgery;  Laterality: N/A;   LAPAROSCOPIC CHOLECYSTECTOMY     LAPAROSCOPIC GASTRIC BANDING     LEFT AND RIGHT HEART CATHETERIZATION WITH CORONARY ANGIOGRAM N/A 05/07/2014   Procedure: LEFT AND RIGHT HEART CATHETERIZATION WITH CORONARY ANGIOGRAM;  Surgeon: [name] [name], MD;  Location: [facility] CATH LAB;  Service: Cardiovascular;  Laterality: N/A;   LEFT ATRIAL APPENDAGE OCCLUSION N/A 06/15/2022   Procedure: LEFT ATRIAL APPENDAGE OCCLUSION;  Surgeon: Lanier Prude, MD;  Location: [facility] INVASIVE CV LAB;  Service: Cardiovascular;  Laterality: N/A;   LEFT HEART CATH AND CORONARY ANGIOGRAPHY N/A 03/13/2017   Procedure: LEFT HEART CATH AND CORONARY ANGIOGRAPHY;  Surgeon: [name], [name], MD;  Location: [facility] INVASIVE CV LAB;  Service: Cardiovascular;  Laterality: N/A;   LUMBAR LAMINECTOMY/DECOMPRESSION MICRODISCECTOMY N/A 05/04/2016   Procedure: Laminectomy and Foraminotomy - Thoracic twelve-Lumbar one -Posterior Fusion Lumbar one-two;  Surgeon: Tia Alert, MD;  Location: [facility]  OR;  Service: Neurosurgery;  Laterality: N/A;   POLYPECTOMY  03/14/2018   Procedure: POLYPECTOMY;  Surgeon: Kerin Salen, MD;  Location: [facility] ENDOSCOPY;  Service: Gastroenterology;;   POSTERIOR LUMBAR FUSION  10/18/2016   SHOULDER OPEN ROTATOR CUFF REPAIR Bilateral    TEE WITHOUT CARDIOVERSION N/A 06/15/2022   Procedure: TRANSESOPHAGEAL ECHOCARDIOGRAM;  Surgeon: Lanier Prude, MD;  Location: [facility] INVASIVE CV LAB;  Service: Cardiovascular;  Laterality: N/A;   TONSILLECTOMY AND ADENOIDECTOMY     TOTAL HIP ARTHROPLASTY Right 10/18/2018   Procedure: RIGHT TOTAL HIP ARTHROPLASTY ANTERIOR APPROACH;  Surgeon: Kathryne Hitch, MD;  Location: [facility] ORS;  Service: Orthopedics;  Laterality: Right;   TRIGGER  FINGER RELEASE     LEFT   UVULOPALATOPHARYNGOPLASTY     VASECTOMY      Allergies  Allergies  Allergen Reactions   Other     Other reaction(s): cough, Other Other reaction(s): cough  Other reaction(s): wt gain   Lotensin [Benazepril] Cough   Tofranil [Imipramine]     Weight gain   Ace Inhibitors Cough   Adhesive [Tape] Itching and Rash   Amoxicillin-Pot Clavulanate Other (See Comments)    GI Upset (intolerance)   Codeine Nausea Only   Latex Itching, Rash and Other (See Comments)    Bandaids   Metformin Diarrhea   Morphine Itching   Nifedipine Other (See Comments)    Other reaction(s): leg edema   Quinolones Other (See Comments)    Patient was warned about not using Cipro and similar antibiotics. Recent studies have raised concern that fluoroquinolone antibiotics could be associated with an increased risk of aortic aneurysm Fluoroquinolones have non-antimicrobial properties that might jeopardise the integrity of the extracellular matrix of the vascular wall In a  propensity score matched cohort study in [place], there was a 66% increased rate of aortic aneurysm or dissection associated with oral fluoroquinolone use, compared wit    History of Present Illness    KISEAN ROLLO  has a PMH of coronary artery disease status post PCI with BMS to OM in 2012, chronic diastolic CHF, prior PE, H TN, HLD, type 2 diabetes, thoracic aortic aneurysm, and paroxysmal atrial fibrillation status post LAA with Watchman.  He underwent repeat cardiac catheterization 2/19 which showed patent OM stent and no other coronary disease.  He was seen in follow-up by Georgie Chard, NP on 10-24.  During that time he reported some increased DOE with normal activities.  His symptoms had worsened over several weeks.  He reported several episodes of chest pain as well.  He reported that he also had an episode that woke him up from his sleep and lasted for a few minutes before dissipating on its own.  He denied sublingual nitroglycerin use.  He denied associated dizziness, lower extremity swelling, orthopnea, vomiting, and diaphoresis.  Echocardiogram was ordered and showed normal LV function, mild concentric left ventricular hypertrophy, moderate dilation of the aortic root measuring 47 mm and severe dilation of the ascending aorta measuring 52 mm.  He follows with Dr. Lavinia Sharps for his ascending aorta which has remained stable.  He presents to the clinic today for follow-up evaluation and states he has noted intermittent episodes of chest discomfort with increased physical activity.  He also notices discomfort occasionally at rest.  He reports that his older sister passed away from heart attack recently.  We reviewed his echocardiogram.  He and his wife expressed understanding.  We reviewed his cholesterol.  We reviewed options for evaluating his chest discomfort.  He wishes to proceed with cardiac PET/CT.  He is limited in his physical activity due to back and right leg discomfort.  His blood pressure continues to be well-controlled.  Today he denies chest pain, shortness of breath, lower extremity edema, fatigue, palpitations, melena, hematuria, hemoptysis, diaphoresis, weakness, presyncope, syncope, orthopnea, and  PND.   Home Medications    Prior to Admission medications   Medication Sig Start Date End Date Taking? Authorizing Provider  acetaminophen (TYLENOL) 500 MG tablet Take 1,000 mg by mouth every 8 (eight) hours as needed for mild pain or headache.     [provider]  albuterol (VENTOLIN HFA) 108 (90 Base) MCG/ACT inhaler Inhale 2 puffs  into the lungs every 4 (four) hours as needed for wheezing or shortness of breath. 08/13/20   Coralyn Helling, MD  amoxicillin (AMOXIL) 500 MG capsule TAKE 4 CAPSULES BY MOUTH AS DIRECTED. TAKE 4 TABLETS 1 HOUR PRIOR TO DENTAL WORK, INCLUDING CLEANINGS. 09/14/22   Georgie Chard D, NP  aspirin EC 81 MG tablet Take 1 tablet (81 mg total) by mouth daily. Swallow whole. 06/15/22 06/15/23  Georgie Chard D, NP  baclofen (LIORESAL) 10 MG tablet Take 10 mg by mouth 3 (three) times daily as needed for muscle spasms. 02/03/22   [provider]  budesonide-formoterol (SYMBICORT) 160-4.5 MCG/ACT inhaler Inhale 2 puffs into the lungs daily as needed (Asthma).    [provider]  clopidogrel (PLAVIX) 75 MG tablet Take 1 tablet (75 mg total) by mouth daily. 06/08/22 12/05/22  Lanier Prude, MD  dextromethorphan-guaiFENesin [facility] DM) 30-600 MG 12hr tablet Take 2 tablets by mouth as needed for cough (Cold).    [provider]  diphenoxylate-atropine (LOMOTIL) 2.5-0.025 MG tablet Take 1 tablet by mouth 4 (four) times daily as needed for diarrhea or loose stools. 03/02/21   [provider]  fluticasone (FLONASE) 50 MCG/ACT nasal spray Place 1 spray into both nostrils as needed for allergies or rhinitis.    [provider]  furosemide (LASIX) 40 MG tablet TAKE ONE TABLET (40MG ) BY MOUTH EACH DAY AS NEEDED FOR SWELLING OR WEIGHT GAIN. Patient taking differently: Take 40 mg by mouth daily as needed for fluid or edema. 03/20/22   [name], [name], MD  furosemide (LASIX) 80 MG tablet Take 1 tablet (80 mg total) by mouth daily. 01/04/21  07/05/22  Newman Nip, NP  Iron, Ferrous Sulfate, 75 (15 Fe) MG/ML SOLN Take 75 mg by mouth every other day.    [provider]  JARDIANCE 25 MG TABS tablet Take 25 mg by mouth daily. 04/18/18   [provider]  losartan (COZAAR) 25 MG tablet TAKE 1 TABLET BY MOUTH EVERY DAY 07/28/20   [name], [name], MD  methocarbamol (ROBAXIN) 500 MG tablet Take 1 tablet (500 mg total) by mouth every 8 (eight) hours as needed for muscle spasms. 10/20/16   Arman Bogus, MD  metoprolol tartrate (LOPRESSOR) 25 MG tablet Take 12.5 mg by mouth 2 (two) times daily.    [provider]  nitroGLYCERIN (NITROSTAT) 0.4 MG SL tablet Place 1 tablet (0.4 mg total) under the tongue every 5 (five) minutes as needed for chest pain. 09/08/21   [name], [name], MD  pantoprazole (PROTONIX) 40 MG tablet TAKE ONE TABLET BY MOUTH BEFORE BREAKFAST (TAKE ON AN EMPTY STOMACH 30 MINUTES PRIOR TO A MEAL) 03/09/21   [provider]  Polyethyl Glycol-Propyl Glycol (SYSTANE OP) Place 1 drop into both eyes daily as needed (for dry eyes).     [provider]  potassium chloride SA (KLOR-CON M20) 20 MEQ tablet Taking two tablet by mouth in the am and 1 tablet in the evening 09/22/22   [name], [name], MD  psyllium (METAMUCIL) 58.6 % packet Take 1 packet by mouth daily as needed (Looose stool).    [provider]  sildenafil (VIAGRA) 100 MG tablet Take 1 tablet (100 mg total) by mouth as needed for erectile dysfunction. 07/13/20   [name], [name], MD  Skin Protectants, Misc. (EUCERIN) cream Apply 1 application topically as needed for dry skin.    [provider]  spironolactone (ALDACTONE) 25 MG tablet Take 1 tablet (25 mg total)  by mouth daily. 04/07/22   Jodelle Gross, NP  tamsulosin (FLOMAX) 0.4 MG CAPS Take 0.4 mg by mouth every evening.     [provider]  topiramate (TOPAMAX) 25 MG capsule Take 50 mg by mouth 2 (two) times daily.     [provider]     Family History    Family History  Problem Relation Age of Onset   Heart disease Mother    Diabetes Mother    Other Mother        stent placement   Emphysema Father 6   Heart attack Sister    He indicated that his mother is alive. He indicated that his father is deceased. He indicated that only one of his two sisters is alive. He indicated that his brother is deceased. He indicated that his maternal grandmother is deceased. He indicated that his maternal grandfather is deceased. He indicated that his paternal grandmother is deceased. He indicated that his paternal grandfather is deceased.  Social History    Social History   Socioeconomic History   Marital status: Married    Spouse name: Not on file   Number of children: 3   Years of education: Not on file   Highest education level: Not on file  Occupational History   Occupation: Retired from [employer], [employer]: RETIRED  Tobacco Use   Smoking status: Former    Current packs/day: 0.00    Average packs/day: 1.5 packs/day for 36.0 years (54.0 ttl pk-yrs)    Types: Cigarettes    Start date: 54    Quit date: 01/24/1992    Years since quitting: 30.7   Smokeless tobacco: Never  Vaping Use   Vaping status: Never Used  Substance and Sexual Activity   Alcohol use: No   Drug use: No   Sexual activity: Not Currently  Other Topics Concern   Not on file  Social History Narrative   Not on file   Social Determinants of Health   Financial Resource Strain: Not on file  Food Insecurity: No Food Insecurity (06/15/2022)   Hunger Vital Sign    Worried About Running Out of Food in the Last Year: Never true    Ran Out of Food in the Last Year: Never true  Transportation Needs: No Transportation Needs (06/15/2022)   PRAPARE - Administrator, Civil Service (Medical): No    Lack of Transportation (Non-Medical): No  Physical Activity: Not on file  Stress: Not on file  Social Connections: Unknown (06/05/2021)   Received  [facility]   Social Network    Social Network: Not on file  Intimate Partner Violence: Not At Risk (06/15/2022)   Humiliation, Afraid, Rape, and Kick questionnaire    Fear of Current or Ex-Partner: No    Emotionally Abused: No    Physically Abused: No    Sexually Abused: No     Review of Systems    General:  No chills, fever, night sweats or weight changes.  Cardiovascular:  No chest pain, dyspnea on exertion, edema, orthopnea, palpitations, paroxysmal nocturnal dyspnea. Dermatological: No rash, lesions/masses Respiratory: No cough, dyspnea Urologic: No hematuria, dysuria Abdominal:   No nausea, vomiting, diarrhea, bright red blood per rectum, melena, or hematemesis Neurologic:  No visual changes, wkns, changes in mental status. All other systems reviewed and are otherwise negative except as noted above.  Physical Exam    VS:  BP 112/60 (BP Location: Left Arm, Patient Position: Sitting, Cuff Size:  Large)   Pulse 63   Wt 247 lb 9.6 oz (112.3 kg)   SpO2 95%   BMI 41.20 kg/m  , BMI Body mass index is 41.2 kg/m. GEN: Well nourished, well developed, in no acute distress. HEENT: normal. Neck: Supple, no JVD, carotid bruits, or masses. Cardiac: RRR, no murmurs, rubs, or gallops. No clubbing, cyanosis, generalized bilateral lower extremity nonpitting edema.  Radials/DP/PT 2+ and equal bilaterally.  Respiratory:  Respirations regular and unlabored, clear to auscultation bilaterally. GI: Soft, nontender, nondistended, BS + x 4. MS: no deformity or atrophy. Skin: warm and dry, no rash. Neuro:  Strength and sensation are intact. Psych: Normal affect.  Accessory Clinical Findings    Recent Labs: 02/21/2022: B Natriuretic Peptide 121.5 03/24/2022: ALT 9 07/19/2022: BUN 22; Creatinine, Ser 1.03; Hemoglobin 10.5; Platelets 146; Potassium 4.4; Sodium 142   Recent Lipid Panel    Component Value Date/Time   CHOL 118 01/07/2013 0410   TRIG 133 01/07/2013 0410   HDL  40 01/07/2013 0410   CHOLHDL 3.0 01/07/2013 0410   VLDL 27 01/07/2013 0410   LDLCALC 51 01/07/2013 0410         ECG personally reviewed by me today-none today.     Echocardiogram 10/26/2022  IMPRESSIONS     1. Left ventricular ejection fraction, by estimation, is 60 to 65%. The  left ventricle has normal function. The left ventricle has no regional  wall motion abnormalities. There is mild concentric left ventricular  hypertrophy. Left ventricular diastolic  parameters are consistent with Grade I diastolic dysfunction (impaired  relaxation). Elevated left atrial pressure. The average left ventricular  global longitudinal strain is -16.7 %. The global longitudinal strain is  abnormal.   2. Right ventricular systolic function is normal. The right ventricular  size is normal.   3. Left atrial size was mildly dilated.   4. The mitral valve is normal in structure. Trivial mitral valve  regurgitation. No evidence of mitral stenosis.   5. The aortic valve is bicuspid. Aortic valve regurgitation is not  visualized. Mild aortic valve stenosis.   6. Aortic dilatation noted. There is moderate dilatation of the aortic  root, measuring 47 mm. There is severe dilatation of the ascending aorta,  measuring 52 mm.   7. The inferior vena cava is normal in size with greater than 50%  respiratory variability, suggesting right atrial pressure of 3 mmHg.   Comparison(s): EF 60%, mild LVH, RVSP 47.2 mmHg, bicuspid AOV, mean 13.0,  peak 23.7 mmHg, AOV 47mm, asc aor 47 mm, minimal coarctation seen.   FINDINGS   Left Ventricle: Left ventricular ejection fraction, by estimation, is 60  to 65%. The left ventricle has normal function. The left ventricle has no  regional wall motion abnormalities. The average left ventricular global  longitudinal strain is -16.7 %.  The global longitudinal strain is abnormal. The left ventricular internal  cavity size was normal in size. There is mild concentric left  ventricular  hypertrophy. Left ventricular diastolic parameters are consistent with  Grade I diastolic dysfunction  (impaired relaxation). Elevated left atrial pressure.   Right Ventricle: The right ventricular size is normal. Right ventricular  systolic function is normal.   Left Atrium: Left atrial size was mildly dilated.   Right Atrium: Right atrial size was normal in size.   Pericardium: There is no evidence of pericardial effusion.   Mitral Valve: The mitral valve is normal in structure. Trivial mitral  valve regurgitation. No evidence of mitral valve stenosis.  Tricuspid Valve: The tricuspid valve is normal in structure. Tricuspid  valve regurgitation is trivial. No evidence of tricuspid stenosis.   Aortic Valve: The aortic valve is bicuspid. Aortic valve regurgitation is  not visualized. Mild aortic stenosis is present. Aortic valve mean  gradient measures 9.0 mmHg. Aortic valve peak gradient measures 17.0 mmHg.  Aortic valve area, by VTI measures  3.05 cm.   Pulmonic Valve: The pulmonic valve was normal in structure. Pulmonic valve  regurgitation is trivial. No evidence of pulmonic stenosis.   Aorta: Aortic dilatation noted. There is moderate dilatation of the aortic  root, measuring 47 mm. There is severe dilatation of the ascending aorta,  measuring 52 mm.   Venous: The inferior vena cava is normal in size with greater than 50%  respiratory variability, suggesting right atrial pressure of 3 mmHg.   IAS/Shunts: The interatrial septum is aneurysmal. No atrial level shunt  detected by color flow Doppler.       Assessment & Plan   1.  Chronic diastolic CHF-weight stable.  Generalized bilateral lower extremity nonpitting Continue current medical therapy Heart healthy low-sodium diet Increase physical activity as tolerated  Hyperlipidemia-LDL 74 10/23. High-fiber diet Increase physical activity as tolerated Fasting lipids and lfts   DOE-echocardiogram  reassuring.  Details above. Order cardiac PET CT Cbc, bmp  Paroxysmal atrial fibrillation-heart rate today 63 bpm.  Denies episodes of accelerated or irregular heartbeat.  Status post watchman closure (06/15/2022). Continue dual antiplatelet therapy aspirin and Plavix-will continue through at least 12/16/2022.  Will continue aspirin therapy lifelong.  Ascending aortic aneurysm, aortic root dilation-aortic root noted to be 47 mm and ascending aorta measured 52 mm. Maintain good blood pressure control Follows with Dr. Lavinia Sharps  Disposition: Follow-up with Dr. [name] after cardiac PET/CT   Thomasene Ripple. Malicia Blasdel NP-C     10/30/2022, 2:56 PM [facility] [address] Office [phone] Fax [phone]    I spent 14 minutes examining this patient, reviewing medications, and using patient centered shared decision making involving her cardiac care.  Prior to her visit I spent greater than 20 minutes reviewing her past medical history,  medications, and prior cardiac tests.

## 2022-10-27 NOTE — Telephone Encounter (Signed)
LVM for Brad with [employer] that we need to be tagged in the patient's airview account for his new CPAP machine that was ordered on 02/18/2022.

## 2022-10-30 ENCOUNTER — Ambulatory Visit: Payer: Medicare Other | Admitting: Adult Health

## 2022-10-30 ENCOUNTER — Encounter: Payer: Self-pay | Admitting: Adult Health

## 2022-10-30 ENCOUNTER — Encounter: Payer: Self-pay | Admitting: General Practice

## 2022-10-30 ENCOUNTER — Telehealth: Payer: Self-pay | Admitting: General Practice

## 2022-10-30 ENCOUNTER — Ambulatory Visit: Payer: Medicare Other | Attending: General Practice | Admitting: General Practice

## 2022-10-30 VITALS — BP 100/50 | HR 64 | Temp 98.1°F | Ht 65.0 in | Wt 247.8 lb

## 2022-10-30 VITALS — BP 112/60 | HR 63 | Wt 247.6 lb

## 2022-10-30 DIAGNOSIS — Z23 Encounter for immunization: Secondary | ICD-10-CM

## 2022-10-30 DIAGNOSIS — R079 Chest pain, unspecified: Secondary | ICD-10-CM

## 2022-10-30 DIAGNOSIS — R0609 Other forms of dyspnea: Secondary | ICD-10-CM | POA: Diagnosis not present

## 2022-10-30 DIAGNOSIS — I5032 Chronic diastolic (congestive) heart failure: Secondary | ICD-10-CM

## 2022-10-30 DIAGNOSIS — R911 Solitary pulmonary nodule: Secondary | ICD-10-CM | POA: Diagnosis not present

## 2022-10-30 DIAGNOSIS — I2510 Atherosclerotic heart disease of native coronary artery without angina pectoris: Secondary | ICD-10-CM | POA: Diagnosis not present

## 2022-10-30 DIAGNOSIS — E782 Mixed hyperlipidemia: Secondary | ICD-10-CM

## 2022-10-30 DIAGNOSIS — J309 Allergic rhinitis, unspecified: Secondary | ICD-10-CM

## 2022-10-30 DIAGNOSIS — G4733 Obstructive sleep apnea (adult) (pediatric): Secondary | ICD-10-CM | POA: Diagnosis not present

## 2022-10-30 DIAGNOSIS — J449 Chronic obstructive pulmonary disease, unspecified: Secondary | ICD-10-CM

## 2022-10-30 DIAGNOSIS — I7121 Aneurysm of the ascending aorta, without rupture: Secondary | ICD-10-CM

## 2022-10-30 MED ORDER — BUDESONIDE-FORMOTEROL FUMARATE 160-4.5 MCG/ACT IN AERO: 2.0000 | INHALATION_SPRAY | Freq: Every day | RESPIRATORY_TRACT | 11 refills | Status: DC | PRN

## 2022-10-30 MED ORDER — SPIRIVA RESPIMAT 2.5 MCG/ACT IN AERS: 2.0000 | INHALATION_SPRAY | Freq: Every day | RESPIRATORY_TRACT | 5 refills | Status: DC

## 2022-10-30 MED ORDER — SPIRIVA RESPIMAT 2.5 MCG/ACT IN AERS: 2.0000 | INHALATION_SPRAY | Freq: Every day | RESPIRATORY_TRACT | Status: DC

## 2022-10-30 MED ORDER — ALBUTEROL SULFATE HFA 108 (90 BASE) MCG/ACT IN AERS: 2.0000 | INHALATION_SPRAY | RESPIRATORY_TRACT | 3 refills | Status: DC | PRN

## 2022-10-30 NOTE — Telephone Encounter (Signed)
Patient states his checkout paperwork regarding PET test. States the front desk did not schedule and asking who will call.  Advised that the order will be placed and once it is approved, they will call to schedule

## 2022-10-30 NOTE — Progress Notes (Unsigned)
color of urine, no urgency or frequency.  No flank pain, no hematuria   MS:  No joint pain or  swelling.  No decreased range of motion.  No back pain.    Physical Exam  BP (!) 100/50 (BP Location: Left Arm, Patient Position: Sitting, Cuff Size: Large)   Pulse 64   Temp 98.1 F (36.7 C) (Oral)   Ht 5\' 5"  (1.651 m)   Wt 247 lb 12.8 oz (112.4 kg)   SpO2 93%   BMI 41.24 kg/m   GEN: A/Ox3; pleasant , NAD, well nourished    HEENT:  [place]/AT,  EACs-clear, TMs-wnl, NOSE-clear, THROAT-clear, no lesions, no postnasal drip or exudate noted.   NECK:  Supple w/ fair ROM; no JVD; normal carotid impulses w/o bruits; no thyromegaly or nodules palpated; no lymphadenopathy.    RESP  Clear  P & A; w/o, wheezes/ rales/ or rhonchi. no accessory muscle use, no dullness to percussion  CARD:  RRR, no m/r/g, no peripheral edema, pulses intact, no cyanosis or clubbing.  GI:   Soft & nt; nml bowel sounds; no organomegaly or masses detected.   Musco: Warm bil, no deformities or joint swelling noted.   Neuro: alert, no focal deficits noted.    Skin: Warm, no lesions or rashes    Lab Results:  CBC    Component Value Date/Time   WBC 5.1 07/19/2022 0956   WBC 6.0 03/24/2022 0627   RBC 3.17 (L) 07/19/2022 0956   RBC 3.26 (L) 03/24/2022 0627   HGB 10.5 (L) 07/19/2022 0956   HCT 28.3 (L) 07/19/2022 0956   PLT 146 (L) 07/19/2022 0956   MCV 89 07/19/2022 0956   MCH 33.1 (H) 07/19/2022 0956   MCH 30.7 03/24/2022 0627   MCHC 37.1 (H) 07/19/2022 0956   MCHC 31.4 03/24/2022 0627   RDW 24.6 (H) 07/19/2022 0956   LYMPHSABS 2.4 03/24/2022 0627   LYMPHSABS 2.0 04/30/2017 1354   MONOABS 0.6 03/24/2022 0627   EOSABS 0.1 03/24/2022 0627   EOSABS 0.2 04/30/2017 1354   BASOSABS 0.0 03/24/2022 0627   BASOSABS 0.0 04/30/2017 1354    BMET    Component Value Date/Time   NA 142 07/19/2022 0956   K 4.4 07/19/2022 0956   CL 109 (H) 07/19/2022 0956   CO2 21 07/19/2022 0956   GLUCOSE 125 (H) 07/19/2022 0956   GLUCOSE 136 (H) 03/24/2022 0627   BUN 22 07/19/2022 0956   CREATININE 1.03 07/19/2022 0956    CREATININE 1.11 10/14/2015 0929   CALCIUM 8.6 07/19/2022 0956   GFRNONAA >60 03/24/2022 0627   GFRAA >60 10/21/2018 0726    BNP    Component Value Date/Time   BNP 121.5 (H) 02/21/2022 1433    ProBNP    Component Value Date/Time   PROBNP 96.0 05/11/2015 1443    Imaging: ECHOCARDIOGRAM COMPLETE  Result Date: 10/26/2022    ECHOCARDIOGRAM REPORT   Patient Name:   Carlos Dawson Date of Exam: 10/26/2022 Medical Rec #:  [id number]        Height:       67.0 in Accession #:    [id number]       Weight:       247.4 lb Date of Birth:  August 07, 1940        BSA:          2.214 m Patient Age:    82 years         BP:  color of urine, no urgency or frequency.  No flank pain, no hematuria   MS:  No joint pain or  swelling.  No decreased range of motion.  No back pain.    Physical Exam  BP (!) 100/50 (BP Location: Left Arm, Patient Position: Sitting, Cuff Size: Large)   Pulse 64   Temp 98.1 F (36.7 C) (Oral)   Ht 5\' 5"  (1.651 m)   Wt 247 lb 12.8 oz (112.4 kg)   SpO2 93%   BMI 41.24 kg/m   GEN: A/Ox3; pleasant , NAD, well nourished    HEENT:  [place]/AT,  EACs-clear, TMs-wnl, NOSE-clear, THROAT-clear, no lesions, no postnasal drip or exudate noted.   NECK:  Supple w/ fair ROM; no JVD; normal carotid impulses w/o bruits; no thyromegaly or nodules palpated; no lymphadenopathy.    RESP  Clear  P & A; w/o, wheezes/ rales/ or rhonchi. no accessory muscle use, no dullness to percussion  CARD:  RRR, no m/r/g, no peripheral edema, pulses intact, no cyanosis or clubbing.  GI:   Soft & nt; nml bowel sounds; no organomegaly or masses detected.   Musco: Warm bil, no deformities or joint swelling noted.   Neuro: alert, no focal deficits noted.    Skin: Warm, no lesions or rashes    Lab Results:  CBC    Component Value Date/Time   WBC 5.1 07/19/2022 0956   WBC 6.0 03/24/2022 0627   RBC 3.17 (L) 07/19/2022 0956   RBC 3.26 (L) 03/24/2022 0627   HGB 10.5 (L) 07/19/2022 0956   HCT 28.3 (L) 07/19/2022 0956   PLT 146 (L) 07/19/2022 0956   MCV 89 07/19/2022 0956   MCH 33.1 (H) 07/19/2022 0956   MCH 30.7 03/24/2022 0627   MCHC 37.1 (H) 07/19/2022 0956   MCHC 31.4 03/24/2022 0627   RDW 24.6 (H) 07/19/2022 0956   LYMPHSABS 2.4 03/24/2022 0627   LYMPHSABS 2.0 04/30/2017 1354   MONOABS 0.6 03/24/2022 0627   EOSABS 0.1 03/24/2022 0627   EOSABS 0.2 04/30/2017 1354   BASOSABS 0.0 03/24/2022 0627   BASOSABS 0.0 04/30/2017 1354    BMET    Component Value Date/Time   NA 142 07/19/2022 0956   K 4.4 07/19/2022 0956   CL 109 (H) 07/19/2022 0956   CO2 21 07/19/2022 0956   GLUCOSE 125 (H) 07/19/2022 0956   GLUCOSE 136 (H) 03/24/2022 0627   BUN 22 07/19/2022 0956   CREATININE 1.03 07/19/2022 0956    CREATININE 1.11 10/14/2015 0929   CALCIUM 8.6 07/19/2022 0956   GFRNONAA >60 03/24/2022 0627   GFRAA >60 10/21/2018 0726    BNP    Component Value Date/Time   BNP 121.5 (H) 02/21/2022 1433    ProBNP    Component Value Date/Time   PROBNP 96.0 05/11/2015 1443    Imaging: ECHOCARDIOGRAM COMPLETE  Result Date: 10/26/2022    ECHOCARDIOGRAM REPORT   Patient Name:   Carlos Dawson Date of Exam: 10/26/2022 Medical Rec #:  [id number]        Height:       67.0 in Accession #:    [id number]       Weight:       247.4 lb Date of Birth:  August 07, 1940        BSA:          2.214 m Patient Age:    82 years         BP:  @Patient  ID: [name], male    DOB: 1940-03-03, 82 y.o.   MRN: [id number]  Chief Complaint  Patient presents with   Follow-up    Referring provider: Thana Ates, MD  HPI:   TEST/EVENTS :   10/30/2022    Cough  Active -  Limited by back issues -10 surgeries. Uses cane.. chronic pain , balance issues    Increased doe , can't walk long distance   Chronic leg swelling on lasix and aldactone .   AAA  PAF   Gets [employer] benefits   Allergies  Allergen Reactions   Other     Other reaction(s): cough, Other Other reaction(s): cough  Other reaction(s): wt gain   Lotensin [Benazepril] Cough   Tofranil [Imipramine]     Weight gain   Ace Inhibitors Cough   Adhesive [Tape] Itching and Rash   Amoxicillin-Pot Clavulanate Other (See Comments)    GI Upset (intolerance)   Codeine Nausea Only   Latex Itching, Rash and Other (See Comments)    Bandaids   Metformin Diarrhea   Morphine Itching   Nifedipine Other (See Comments)    Other reaction(s): leg edema   Quinolones Other (See Comments)    Patient was warned about not using Cipro and similar antibiotics. Recent studies have raised concern that fluoroquinolone antibiotics could be associated with an increased risk of aortic aneurysm Fluoroquinolones have non-antimicrobial properties that might jeopardise the integrity of the extracellular matrix of the vascular wall In a  propensity score matched cohort study in [place], there was a 66% increased rate of aortic aneurysm or dissection associated with oral fluoroquinolone use, compared wit    Immunization History  Administered Date(s) Administered   Fluad Quad(high Dose 65+) 11/26/2018, 10/04/2020   Influenza Split 12/08/2010, 10/24/2011, 10/23/2012, 10/23/2013, 11/19/2014, 10/19/2015, 11/04/2016, 10/17/2017, 11/26/2018, 10/04/2020   Influenza Whole 09/23/2009, 10/23/2011   Influenza, High Dose Seasonal PF 10/24/2011, 12/04/2013, 10/19/2015, 11/06/2016, 11/13/2016,  10/27/2019   Influenza,inj,Quad PF,6+ Mos 10/23/2013, 10/24/2014   Influenza-Unspecified 12/08/2010, 10/23/2012, 10/23/2013, 10/24/2014, 10/19/2015, 11/04/2016, 11/26/2018, 10/26/2020   PFIZER(Purple Top)SARS-COV-2 Vaccination 02/13/2019, 03/06/2019, 12/29/2019   Pneumococcal Conjugate-13 04/24/2011, 10/23/2013, 12/04/2013, 01/23/2014   Pneumococcal Polysaccharide-23 11/22/2010, 05/09/2011, 11/21/2020   Pneumococcal-Unspecified 09/23/1997, 04/24/2011   Tdap 01/25/2008, 05/19/2008   Typhoid Inactivated 11/02/2015   Zoster Recombinant(Shingrix) 04/05/2017, 07/05/2017   Zoster, Live 06/03/2014, 10/24/2014, 07/05/2017    Past Medical History:  Diagnosis Date   Anticoagulant long-term use    Aortic root enlargement (HCC)    Ascending aortic aneurysm (HCC)    recent scan in October 2012 showing no change; followed by Dr. [name]   ASCVD (arteriosclerotic cardiovascular disease)    Prior BMS to the 2nd OM in September 2012; with repeat cath in October showing patency   CAD (coronary artery disease)    a. s/p BMS to 2nd OM in Sept 2012; b. LexiScan Myoview (12/2012):  Inf infarct; bowel and motion artifact make study difficult to interpret; no ischemia; not gated; Low Risk   CHF (congestive heart failure) (HCC)    no recent issues 10/13/14   Chronic back pain    "all over my back" (05/11/2017)   Colonic polyp    Contact lens/glasses fitting    Diastolic dysfunction    DVT (deep venous thrombosis) (HCC)    ?LLE   Frequent headaches    "probably weekly" (05/11/2017)   Generalized headaches    neck stenosis   GERD (gastroesophageal reflux disease)    Hearing loss    Hearing loss  color of urine, no urgency or frequency.  No flank pain, no hematuria   MS:  No joint pain or  swelling.  No decreased range of motion.  No back pain.    Physical Exam  BP (!) 100/50 (BP Location: Left Arm, Patient Position: Sitting, Cuff Size: Large)   Pulse 64   Temp 98.1 F (36.7 C) (Oral)   Ht 5\' 5"  (1.651 m)   Wt 247 lb 12.8 oz (112.4 kg)   SpO2 93%   BMI 41.24 kg/m   GEN: A/Ox3; pleasant , NAD, well nourished    HEENT:  [place]/AT,  EACs-clear, TMs-wnl, NOSE-clear, THROAT-clear, no lesions, no postnasal drip or exudate noted.   NECK:  Supple w/ fair ROM; no JVD; normal carotid impulses w/o bruits; no thyromegaly or nodules palpated; no lymphadenopathy.    RESP  Clear  P & A; w/o, wheezes/ rales/ or rhonchi. no accessory muscle use, no dullness to percussion  CARD:  RRR, no m/r/g, no peripheral edema, pulses intact, no cyanosis or clubbing.  GI:   Soft & nt; nml bowel sounds; no organomegaly or masses detected.   Musco: Warm bil, no deformities or joint swelling noted.   Neuro: alert, no focal deficits noted.    Skin: Warm, no lesions or rashes    Lab Results:  CBC    Component Value Date/Time   WBC 5.1 07/19/2022 0956   WBC 6.0 03/24/2022 0627   RBC 3.17 (L) 07/19/2022 0956   RBC 3.26 (L) 03/24/2022 0627   HGB 10.5 (L) 07/19/2022 0956   HCT 28.3 (L) 07/19/2022 0956   PLT 146 (L) 07/19/2022 0956   MCV 89 07/19/2022 0956   MCH 33.1 (H) 07/19/2022 0956   MCH 30.7 03/24/2022 0627   MCHC 37.1 (H) 07/19/2022 0956   MCHC 31.4 03/24/2022 0627   RDW 24.6 (H) 07/19/2022 0956   LYMPHSABS 2.4 03/24/2022 0627   LYMPHSABS 2.0 04/30/2017 1354   MONOABS 0.6 03/24/2022 0627   EOSABS 0.1 03/24/2022 0627   EOSABS 0.2 04/30/2017 1354   BASOSABS 0.0 03/24/2022 0627   BASOSABS 0.0 04/30/2017 1354    BMET    Component Value Date/Time   NA 142 07/19/2022 0956   K 4.4 07/19/2022 0956   CL 109 (H) 07/19/2022 0956   CO2 21 07/19/2022 0956   GLUCOSE 125 (H) 07/19/2022 0956   GLUCOSE 136 (H) 03/24/2022 0627   BUN 22 07/19/2022 0956   CREATININE 1.03 07/19/2022 0956    CREATININE 1.11 10/14/2015 0929   CALCIUM 8.6 07/19/2022 0956   GFRNONAA >60 03/24/2022 0627   GFRAA >60 10/21/2018 0726    BNP    Component Value Date/Time   BNP 121.5 (H) 02/21/2022 1433    ProBNP    Component Value Date/Time   PROBNP 96.0 05/11/2015 1443    Imaging: ECHOCARDIOGRAM COMPLETE  Result Date: 10/26/2022    ECHOCARDIOGRAM REPORT   Patient Name:   Carlos Dawson Date of Exam: 10/26/2022 Medical Rec #:  [id number]        Height:       67.0 in Accession #:    [id number]       Weight:       247.4 lb Date of Birth:  August 07, 1940        BSA:          2.214 m Patient Age:    82 years         BP:  @Patient  ID: [name], male    DOB: 1940-03-03, 82 y.o.   MRN: [id number]  Chief Complaint  Patient presents with   Follow-up    Referring provider: Thana Ates, MD  HPI:   TEST/EVENTS :   10/30/2022    Cough  Active -  Limited by back issues -10 surgeries. Uses cane.. chronic pain , balance issues    Increased doe , can't walk long distance   Chronic leg swelling on lasix and aldactone .   AAA  PAF   Gets [employer] benefits   Allergies  Allergen Reactions   Other     Other reaction(s): cough, Other Other reaction(s): cough  Other reaction(s): wt gain   Lotensin [Benazepril] Cough   Tofranil [Imipramine]     Weight gain   Ace Inhibitors Cough   Adhesive [Tape] Itching and Rash   Amoxicillin-Pot Clavulanate Other (See Comments)    GI Upset (intolerance)   Codeine Nausea Only   Latex Itching, Rash and Other (See Comments)    Bandaids   Metformin Diarrhea   Morphine Itching   Nifedipine Other (See Comments)    Other reaction(s): leg edema   Quinolones Other (See Comments)    Patient was warned about not using Cipro and similar antibiotics. Recent studies have raised concern that fluoroquinolone antibiotics could be associated with an increased risk of aortic aneurysm Fluoroquinolones have non-antimicrobial properties that might jeopardise the integrity of the extracellular matrix of the vascular wall In a  propensity score matched cohort study in [place], there was a 66% increased rate of aortic aneurysm or dissection associated with oral fluoroquinolone use, compared wit    Immunization History  Administered Date(s) Administered   Fluad Quad(high Dose 65+) 11/26/2018, 10/04/2020   Influenza Split 12/08/2010, 10/24/2011, 10/23/2012, 10/23/2013, 11/19/2014, 10/19/2015, 11/04/2016, 10/17/2017, 11/26/2018, 10/04/2020   Influenza Whole 09/23/2009, 10/23/2011   Influenza, High Dose Seasonal PF 10/24/2011, 12/04/2013, 10/19/2015, 11/06/2016, 11/13/2016,  10/27/2019   Influenza,inj,Quad PF,6+ Mos 10/23/2013, 10/24/2014   Influenza-Unspecified 12/08/2010, 10/23/2012, 10/23/2013, 10/24/2014, 10/19/2015, 11/04/2016, 11/26/2018, 10/26/2020   PFIZER(Purple Top)SARS-COV-2 Vaccination 02/13/2019, 03/06/2019, 12/29/2019   Pneumococcal Conjugate-13 04/24/2011, 10/23/2013, 12/04/2013, 01/23/2014   Pneumococcal Polysaccharide-23 11/22/2010, 05/09/2011, 11/21/2020   Pneumococcal-Unspecified 09/23/1997, 04/24/2011   Tdap 01/25/2008, 05/19/2008   Typhoid Inactivated 11/02/2015   Zoster Recombinant(Shingrix) 04/05/2017, 07/05/2017   Zoster, Live 06/03/2014, 10/24/2014, 07/05/2017    Past Medical History:  Diagnosis Date   Anticoagulant long-term use    Aortic root enlargement (HCC)    Ascending aortic aneurysm (HCC)    recent scan in October 2012 showing no change; followed by Dr. [name]   ASCVD (arteriosclerotic cardiovascular disease)    Prior BMS to the 2nd OM in September 2012; with repeat cath in October showing patency   CAD (coronary artery disease)    a. s/p BMS to 2nd OM in Sept 2012; b. LexiScan Myoview (12/2012):  Inf infarct; bowel and motion artifact make study difficult to interpret; no ischemia; not gated; Low Risk   CHF (congestive heart failure) (HCC)    no recent issues 10/13/14   Chronic back pain    "all over my back" (05/11/2017)   Colonic polyp    Contact lens/glasses fitting    Diastolic dysfunction    DVT (deep venous thrombosis) (HCC)    ?LLE   Frequent headaches    "probably weekly" (05/11/2017)   Generalized headaches    neck stenosis   GERD (gastroesophageal reflux disease)    Hearing loss    Hearing loss  color of urine, no urgency or frequency.  No flank pain, no hematuria   MS:  No joint pain or  swelling.  No decreased range of motion.  No back pain.    Physical Exam  BP (!) 100/50 (BP Location: Left Arm, Patient Position: Sitting, Cuff Size: Large)   Pulse 64   Temp 98.1 F (36.7 C) (Oral)   Ht 5\' 5"  (1.651 m)   Wt 247 lb 12.8 oz (112.4 kg)   SpO2 93%   BMI 41.24 kg/m   GEN: A/Ox3; pleasant , NAD, well nourished    HEENT:  [place]/AT,  EACs-clear, TMs-wnl, NOSE-clear, THROAT-clear, no lesions, no postnasal drip or exudate noted.   NECK:  Supple w/ fair ROM; no JVD; normal carotid impulses w/o bruits; no thyromegaly or nodules palpated; no lymphadenopathy.    RESP  Clear  P & A; w/o, wheezes/ rales/ or rhonchi. no accessory muscle use, no dullness to percussion  CARD:  RRR, no m/r/g, no peripheral edema, pulses intact, no cyanosis or clubbing.  GI:   Soft & nt; nml bowel sounds; no organomegaly or masses detected.   Musco: Warm bil, no deformities or joint swelling noted.   Neuro: alert, no focal deficits noted.    Skin: Warm, no lesions or rashes    Lab Results:  CBC    Component Value Date/Time   WBC 5.1 07/19/2022 0956   WBC 6.0 03/24/2022 0627   RBC 3.17 (L) 07/19/2022 0956   RBC 3.26 (L) 03/24/2022 0627   HGB 10.5 (L) 07/19/2022 0956   HCT 28.3 (L) 07/19/2022 0956   PLT 146 (L) 07/19/2022 0956   MCV 89 07/19/2022 0956   MCH 33.1 (H) 07/19/2022 0956   MCH 30.7 03/24/2022 0627   MCHC 37.1 (H) 07/19/2022 0956   MCHC 31.4 03/24/2022 0627   RDW 24.6 (H) 07/19/2022 0956   LYMPHSABS 2.4 03/24/2022 0627   LYMPHSABS 2.0 04/30/2017 1354   MONOABS 0.6 03/24/2022 0627   EOSABS 0.1 03/24/2022 0627   EOSABS 0.2 04/30/2017 1354   BASOSABS 0.0 03/24/2022 0627   BASOSABS 0.0 04/30/2017 1354    BMET    Component Value Date/Time   NA 142 07/19/2022 0956   K 4.4 07/19/2022 0956   CL 109 (H) 07/19/2022 0956   CO2 21 07/19/2022 0956   GLUCOSE 125 (H) 07/19/2022 0956   GLUCOSE 136 (H) 03/24/2022 0627   BUN 22 07/19/2022 0956   CREATININE 1.03 07/19/2022 0956    CREATININE 1.11 10/14/2015 0929   CALCIUM 8.6 07/19/2022 0956   GFRNONAA >60 03/24/2022 0627   GFRAA >60 10/21/2018 0726    BNP    Component Value Date/Time   BNP 121.5 (H) 02/21/2022 1433    ProBNP    Component Value Date/Time   PROBNP 96.0 05/11/2015 1443    Imaging: ECHOCARDIOGRAM COMPLETE  Result Date: 10/26/2022    ECHOCARDIOGRAM REPORT   Patient Name:   Carlos Dawson Date of Exam: 10/26/2022 Medical Rec #:  [id number]        Height:       67.0 in Accession #:    [id number]       Weight:       247.4 lb Date of Birth:  August 07, 1940        BSA:          2.214 m Patient Age:    82 years         BP:  color of urine, no urgency or frequency.  No flank pain, no hematuria   MS:  No joint pain or  swelling.  No decreased range of motion.  No back pain.    Physical Exam  BP (!) 100/50 (BP Location: Left Arm, Patient Position: Sitting, Cuff Size: Large)   Pulse 64   Temp 98.1 F (36.7 C) (Oral)   Ht 5\' 5"  (1.651 m)   Wt 247 lb 12.8 oz (112.4 kg)   SpO2 93%   BMI 41.24 kg/m   GEN: A/Ox3; pleasant , NAD, well nourished    HEENT:  [place]/AT,  EACs-clear, TMs-wnl, NOSE-clear, THROAT-clear, no lesions, no postnasal drip or exudate noted.   NECK:  Supple w/ fair ROM; no JVD; normal carotid impulses w/o bruits; no thyromegaly or nodules palpated; no lymphadenopathy.    RESP  Clear  P & A; w/o, wheezes/ rales/ or rhonchi. no accessory muscle use, no dullness to percussion  CARD:  RRR, no m/r/g, no peripheral edema, pulses intact, no cyanosis or clubbing.  GI:   Soft & nt; nml bowel sounds; no organomegaly or masses detected.   Musco: Warm bil, no deformities or joint swelling noted.   Neuro: alert, no focal deficits noted.    Skin: Warm, no lesions or rashes    Lab Results:  CBC    Component Value Date/Time   WBC 5.1 07/19/2022 0956   WBC 6.0 03/24/2022 0627   RBC 3.17 (L) 07/19/2022 0956   RBC 3.26 (L) 03/24/2022 0627   HGB 10.5 (L) 07/19/2022 0956   HCT 28.3 (L) 07/19/2022 0956   PLT 146 (L) 07/19/2022 0956   MCV 89 07/19/2022 0956   MCH 33.1 (H) 07/19/2022 0956   MCH 30.7 03/24/2022 0627   MCHC 37.1 (H) 07/19/2022 0956   MCHC 31.4 03/24/2022 0627   RDW 24.6 (H) 07/19/2022 0956   LYMPHSABS 2.4 03/24/2022 0627   LYMPHSABS 2.0 04/30/2017 1354   MONOABS 0.6 03/24/2022 0627   EOSABS 0.1 03/24/2022 0627   EOSABS 0.2 04/30/2017 1354   BASOSABS 0.0 03/24/2022 0627   BASOSABS 0.0 04/30/2017 1354    BMET    Component Value Date/Time   NA 142 07/19/2022 0956   K 4.4 07/19/2022 0956   CL 109 (H) 07/19/2022 0956   CO2 21 07/19/2022 0956   GLUCOSE 125 (H) 07/19/2022 0956   GLUCOSE 136 (H) 03/24/2022 0627   BUN 22 07/19/2022 0956   CREATININE 1.03 07/19/2022 0956    CREATININE 1.11 10/14/2015 0929   CALCIUM 8.6 07/19/2022 0956   GFRNONAA >60 03/24/2022 0627   GFRAA >60 10/21/2018 0726    BNP    Component Value Date/Time   BNP 121.5 (H) 02/21/2022 1433    ProBNP    Component Value Date/Time   PROBNP 96.0 05/11/2015 1443    Imaging: ECHOCARDIOGRAM COMPLETE  Result Date: 10/26/2022    ECHOCARDIOGRAM REPORT   Patient Name:   Carlos Dawson Date of Exam: 10/26/2022 Medical Rec #:  [id number]        Height:       67.0 in Accession #:    [id number]       Weight:       247.4 lb Date of Birth:  August 07, 1940        BSA:          2.214 m Patient Age:    82 years         BP:

## 2022-10-30 NOTE — Progress Notes (Unsigned)
Patient seen in the office today and instructed on use of Spiriva.  Patient expressed understanding and demonstrated technique. 

## 2022-10-30 NOTE — Telephone Encounter (Signed)
Pt calling in asking to speak with nurse about his appt today

## 2022-10-30 NOTE — Patient Instructions (Addendum)
Medication Instructions:  The current medical regimen is effective;  continue present plan and medications as directed. Please refer to the Current Medication list given to you today.  *If you need a refill on your cardiac medications before your next appointment, please call your pharmacy*  Lab Work: FASTING LIPID, CBC,BMET, LFT SOMETIME THIS WEEK If you have labs (blood work) drawn today and your tests are completely normal, you will receive your results only by:    MyChart Message (if you have MyChart) OR   A paper copy in the mail If you have any lab test that is abnormal or we need to change your treatment, we will call you to review the results.  Testing/Procedures:  Cardiac PET/CT Stress Test-SCHEDULE IN [place]  Follow-Up: At [facility], you and your health needs are our priority.  As part of our continuing mission to provide you with exceptional heart care, we have created designated Provider Care Teams.  These Care Teams include your primary Cardiologist (physician) and Advanced Practice Providers (APPs -  Physician Assistants and Nurse Practitioners) who all work together to provide you with the care you need, when you need it.  Your next appointment:   2 month(s)-MAKE SURE THIS IS AFTER PET CT  Provider:   Peter [name], MD  or Edd Fabian, FNP          How to Prepare for Your Cardiac PET/CT Stress Test:  1. Please do not take these medications before your test:   Medications that may interfere with the cardiac pharmacological stress agent (ex. nitrates - including erectile dysfunction medications, isosorbide mononitrate, tamulosin or beta-blockers) the day of the exam. (Erectile dysfunction medication should be held for at least 72 hrs prior to test) Theophylline containing medications for 12 hours. Dipyridamole 48 hours prior to the test. Your remaining medications may be taken with water.  2. Nothing to eat or drink, except water, 3 hours prior to  arrival time.   NO caffeine/decaffeinated products, or chocolate 12 hours prior to arrival.  3. NO perfume, cologne or lotion on chest or abdomen area.          - FEMALES - Please avoid wearing dresses to this appointment.  4. Total time is 1 to 2 hours; you may want to bring reading material for the waiting time.  5. Please report to Radiology at the [facility] Main Entrance 30 minutes early for your test.  [address] [place], [place] [address]  6. Please report to Radiology at [facility] Main Entrance, medical mall, 30 mins prior to your test.  [address]  [place], [place]  [phone]  Diabetic Preparation:  Hold oral medications. You may take NPH and Lantus insulin. Do not take Humalog or Humulin R (Regular Insulin) the day of your test. Check blood sugars prior to leaving the house. If able to eat breakfast prior to 3 hour fasting, you may take all medications, including your insulin, Do not worry if you miss your breakfast dose of insulin - start at your next meal. Patients who wear a continuous glucose monitor MUST remove the device prior to scanning.  IF YOU THINK YOU MAY BE PREGNANT, OR ARE NURSING PLEASE INFORM THE TECHNOLOGIST.  In preparation for your appointment, medication and supplies will be purchased.  Appointment availability is limited, so if you need to cancel or reschedule, please call the Radiology Department at [phone] [facility]) OR [phone] [facility])  24 hours in advance to  avoid a cancellation fee of $100.00  What to Expect After you Arrive:  Once you arrive and check in for your appointment, you will be taken to a preparation room within the Radiology Department.  A technologist or Nurse will obtain your medical history, verify that you are correctly prepped for the exam, and explain the procedure.  Afterwards,  an IV will be started in your arm and electrodes will be placed on your skin for EKG  monitoring during the stress portion of the exam. Then you will be escorted to the PET/CT scanner.  There, staff will get you positioned on the scanner and obtain a blood pressure and EKG.  During the exam, you will continue to be connected to the EKG and blood pressure machines.  A small, safe amount of a radioactive tracer will be injected in your IV to obtain a series of pictures of your heart along with an injection of a stress agent.    After your Exam:  It is recommended that you eat a meal and drink a caffeinated beverage to counter act any effects of the stress agent.  Drink plenty of fluids for the remainder of the day and urinate frequently for the first couple of hours after the exam.  Your doctor will inform you of your test results within 7-10 business days.  For more information and frequently asked questions, please visit our website : [email or web address]  For questions about your test or how to prepare for your test, please call: Cardiac Imaging Nurse Navigators Office: [phone]

## 2022-10-30 NOTE — Patient Instructions (Addendum)
Continue on Zyrtec or Claritin 10mg  At bedtime Saline nasal rinses As needed   Delsym 2 tsp Twice daily  As needed  Cough/congestion .  Tessalon Three times a day  For cough As needed   Continue on Symbicort 2 puffs Twice daily  , rinse after use.  Begin Spiriva 2 puffs daily . ProAir As needed wheezing .    Continue on CPAP At bedtime   CPAP download  Work on healthy weight  Do not drive if sleepy .  Saline nasal gel At bedtime    Flu shot today .   Follow up with Dr. Wynona Neat or Murrel Freet NP (30 min slot ) in 4 months with PFT   Please contact office for sooner follow up if symptoms do not improve or worsen or seek emergency care

## 2022-10-31 ENCOUNTER — Encounter: Payer: Self-pay | Admitting: Adult Health

## 2022-10-31 DIAGNOSIS — R911 Solitary pulmonary nodule: Secondary | ICD-10-CM | POA: Insufficient documentation

## 2022-10-31 DIAGNOSIS — I7121 Aneurysm of the ascending aorta, without rupture: Secondary | ICD-10-CM | POA: Diagnosis not present

## 2022-10-31 DIAGNOSIS — J449 Chronic obstructive pulmonary disease, unspecified: Secondary | ICD-10-CM | POA: Insufficient documentation

## 2022-10-31 DIAGNOSIS — E782 Mixed hyperlipidemia: Secondary | ICD-10-CM | POA: Diagnosis not present

## 2022-10-31 DIAGNOSIS — R0609 Other forms of dyspnea: Secondary | ICD-10-CM | POA: Diagnosis not present

## 2022-10-31 DIAGNOSIS — I5032 Chronic diastolic (congestive) heart failure: Secondary | ICD-10-CM | POA: Diagnosis not present

## 2022-10-31 DIAGNOSIS — R079 Chest pain, unspecified: Secondary | ICD-10-CM | POA: Diagnosis not present

## 2022-10-31 DIAGNOSIS — I2510 Atherosclerotic heart disease of native coronary artery without angina pectoris: Secondary | ICD-10-CM | POA: Diagnosis not present

## 2022-10-31 NOTE — Assessment & Plan Note (Signed)
Chronic allergic rhinitis.  Continue on current regimen.  Plan  Patient Instructions  Continue on Zyrtec or Claritin 10mg  At bedtime Saline nasal rinses As needed   Delsym 2 tsp Twice daily  As needed  Cough/congestion .  Tessalon Three times a day  For cough As needed   Continue on Symbicort 2 puffs Twice daily  , rinse after use.  Begin Spiriva 2 puffs daily . ProAir As needed wheezing .    Continue on CPAP At bedtime   CPAP download  Work on healthy weight  Do not drive if sleepy .  Saline nasal gel At bedtime    Flu shot today .   Follow up with Dr. Wynona Neat or Claira Jeter NP (30 min slot ) in 4 months with PFT   Please contact office for sooner follow up if symptoms do not improve or worsen or seek emergency care

## 2022-10-31 NOTE — Assessment & Plan Note (Signed)
Recent CT chest August 16, 2022 showed a small stable 5 mm groundglass nodule the right lower lobe consistent with a benign etiology.  No further imaging at this time.

## 2022-10-31 NOTE — Assessment & Plan Note (Signed)
History of obstructive sleep.  Continue on CPAP.  CPAP download has been requested

## 2022-10-31 NOTE — Assessment & Plan Note (Signed)
Chronic diastolic heart failure, history of A-fib-appears euvolemic on exam.  Does have some chronic lower extremity edema but does not appear to be in overt fluid overload.  Continue follow-up with cardiology recent echo showed stable EF and diastolic dysfunction

## 2022-10-31 NOTE — Assessment & Plan Note (Signed)
COPD with asthma.  Patient has increased symptom burden with worsening shortness of breath.  Check PFTs on return.  Continue on Symbicort.  Add in Spiriva daily.  Plan  Patient Instructions  Continue on Zyrtec or Claritin 10mg  At bedtime Saline nasal rinses As needed   Delsym 2 tsp Twice daily  As needed  Cough/congestion .  Tessalon Three times a day  For cough As needed   Continue on Symbicort 2 puffs Twice daily  , rinse after use.  Begin Spiriva 2 puffs daily . ProAir As needed wheezing .    Continue on CPAP At bedtime   CPAP download  Work on healthy weight  Do not drive if sleepy .  Saline nasal gel At bedtime    Flu shot today .   Follow up with Dr. Wynona Neat or Ryo Klang NP (30 min slot ) in 4 months with PFT   Please contact office for sooner follow up if symptoms do not improve or worsen or seek emergency care

## 2022-11-01 DIAGNOSIS — L718 Other rosacea: Secondary | ICD-10-CM | POA: Diagnosis not present

## 2022-11-01 DIAGNOSIS — B353 Tinea pedis: Secondary | ICD-10-CM | POA: Diagnosis not present

## 2022-11-01 DIAGNOSIS — L218 Other seborrheic dermatitis: Secondary | ICD-10-CM | POA: Diagnosis not present

## 2022-11-01 LAB — CBC
Hematocrit: 35.1 % — ABNORMAL LOW (ref 37.5–51.0)
Hemoglobin: 11.7 g/dL — ABNORMAL LOW (ref 13.0–17.7)
MCH: 35.2 pg — ABNORMAL HIGH (ref 26.6–33.0)
MCHC: 33.3 g/dL (ref 31.5–35.7)
MCV: 106 fL — ABNORMAL HIGH (ref 79–97)
Platelets: 114 10*3/uL — ABNORMAL LOW (ref 150–450)
RBC: 3.32 x10E6/uL — ABNORMAL LOW (ref 4.14–5.80)
RDW: 14.1 % (ref 11.6–15.4)
WBC: 5.1 10*3/uL (ref 3.4–10.8)

## 2022-11-01 LAB — LIPID PANEL
Chol/HDL Ratio: 2.2 {ratio} (ref 0.0–5.0)
Cholesterol, Total: 132 mg/dL (ref 100–199)
HDL: 61 mg/dL (ref >39)
LDL Chol Calc (NIH): 61 mg/dL (ref 0–99)
Triglycerides: 42 mg/dL (ref 0–149)
VLDL Cholesterol Cal: 10 mg/dL (ref 5–40)

## 2022-11-01 LAB — HEPATIC FUNCTION PANEL
ALT: 9 [IU]/L (ref 0–44)
AST: 11 [IU]/L (ref 0–40)
Albumin: 4 g/dL (ref 3.7–4.7)
Alkaline Phosphatase: 103 [IU]/L (ref 44–121)
Bilirubin Total: 1.2 mg/dL (ref 0.0–1.2)
Bilirubin, Direct: 0.4 mg/dL (ref 0.00–0.40)
Total Protein: 5.9 g/dL — ABNORMAL LOW (ref 6.0–8.5)

## 2022-11-01 LAB — BASIC METABOLIC PANEL
BUN/Creatinine Ratio: 24 (ref 10–24)
BUN: 25 mg/dL (ref 8–27)
CO2: 24 mmol/L (ref 20–29)
Calcium: 8.9 mg/dL (ref 8.6–10.2)
Chloride: 105 mmol/L (ref 96–106)
Creatinine, Ser: 1.03 mg/dL (ref 0.76–1.27)
Glucose: 133 mg/dL — ABNORMAL HIGH (ref 70–99)
Potassium: 4.6 mmol/L (ref 3.5–5.2)
Sodium: 140 mmol/L (ref 134–144)
eGFR: 73 mL/min/{1.73_m2} (ref >59)

## 2022-11-13 DIAGNOSIS — N183 Chronic kidney disease, stage 3 unspecified: Secondary | ICD-10-CM | POA: Diagnosis not present

## 2022-11-13 DIAGNOSIS — E1169 Type 2 diabetes mellitus with other specified complication: Secondary | ICD-10-CM | POA: Diagnosis not present

## 2022-11-13 DIAGNOSIS — D649 Anemia, unspecified: Secondary | ICD-10-CM | POA: Diagnosis not present

## 2022-11-13 DIAGNOSIS — Z0000 Encounter for general adult medical examination without abnormal findings: Secondary | ICD-10-CM | POA: Diagnosis not present

## 2022-11-13 DIAGNOSIS — I719 Aortic aneurysm of unspecified site, without rupture: Secondary | ICD-10-CM | POA: Diagnosis not present

## 2022-11-13 DIAGNOSIS — J449 Chronic obstructive pulmonary disease, unspecified: Secondary | ICD-10-CM | POA: Diagnosis not present

## 2022-11-13 DIAGNOSIS — N400 Enlarged prostate without lower urinary tract symptoms: Secondary | ICD-10-CM | POA: Diagnosis not present

## 2022-11-13 DIAGNOSIS — G4733 Obstructive sleep apnea (adult) (pediatric): Secondary | ICD-10-CM | POA: Diagnosis not present

## 2022-11-13 DIAGNOSIS — I10 Essential (primary) hypertension: Secondary | ICD-10-CM | POA: Diagnosis not present

## 2022-11-13 DIAGNOSIS — D6869 Other thrombophilia: Secondary | ICD-10-CM | POA: Diagnosis not present

## 2022-11-13 DIAGNOSIS — I480 Paroxysmal atrial fibrillation: Secondary | ICD-10-CM | POA: Diagnosis not present

## 2022-11-13 DIAGNOSIS — I5030 Unspecified diastolic (congestive) heart failure: Secondary | ICD-10-CM | POA: Diagnosis not present

## 2022-11-13 DIAGNOSIS — Z1331 Encounter for screening for depression: Secondary | ICD-10-CM | POA: Diagnosis not present

## 2022-11-13 DIAGNOSIS — I2510 Atherosclerotic heart disease of native coronary artery without angina pectoris: Secondary | ICD-10-CM | POA: Diagnosis not present

## 2022-11-16 ENCOUNTER — Encounter (HOSPITAL_COMMUNITY): Payer: Self-pay

## 2022-11-21 ENCOUNTER — Encounter (HOSPITAL_COMMUNITY)
Admission: RE | Admit: 2022-11-21 | Discharge: 2022-11-21 | Disposition: A | Discharge status: Home / Self Care | Payer: Medicare Other | Source: Ambulatory Visit | Attending: General Practice | Admitting: General Practice

## 2022-11-21 DIAGNOSIS — I2510 Atherosclerotic heart disease of native coronary artery without angina pectoris: Secondary | ICD-10-CM | POA: Insufficient documentation

## 2022-11-21 LAB — NM PET CT CARDIAC PERFUSION MULTI W/ABSOLUTE BLOODFLOW
LV dias vol: 111 mL (ref 62–150)
LV sys vol: 50 mL
MBFR: 1.57
Nuc Rest EF: 55 %
Nuc Stress EF: 58 %
Peak HR: 83 {beats}/min
Rest HR: 64 {beats}/min
Rest MBF: 0.91 ml/g/min
Rest Nuclear Isotope Dose: 29.1 mCi
ST Depression (mm): 0 mm
Stress MBF: 1.43 ml/g/min
Stress Nuclear Isotope Dose: 29.1 mCi

## 2022-11-21 MED ORDER — RUBIDIUM RB82 GENERATOR (RUBYFILL)
29.0800 | PACK | Freq: Once | INTRAVENOUS | Status: AC
  Administered 2022-11-21: 29.08 via INTRAVENOUS

## 2022-11-21 MED ORDER — REGADENOSON 0.4 MG/5ML IV SOLN
INTRAVENOUS | Status: AC
  Filled 2022-11-21: qty 5

## 2022-11-21 MED ORDER — REGADENOSON 0.4 MG/5ML IV SOLN
0.4000 mg | Freq: Once | INTRAVENOUS | Status: AC
  Administered 2022-11-21: 0.4 mg via INTRAVENOUS

## 2022-11-23 ENCOUNTER — Ambulatory Visit (INDEPENDENT_AMBULATORY_CARE_PROVIDER_SITE_OTHER): Payer: Medicare Other

## 2022-11-23 ENCOUNTER — Encounter: Payer: Self-pay | Admitting: Podiatry

## 2022-11-23 ENCOUNTER — Ambulatory Visit (INDEPENDENT_AMBULATORY_CARE_PROVIDER_SITE_OTHER): Payer: Medicare Other | Admitting: Podiatry

## 2022-11-23 VITALS — Ht 65.0 in | Wt 247.8 lb

## 2022-11-23 DIAGNOSIS — L84 Corns and callosities: Secondary | ICD-10-CM | POA: Diagnosis not present

## 2022-11-23 DIAGNOSIS — M216X9 Other acquired deformities of unspecified foot: Secondary | ICD-10-CM

## 2022-11-23 DIAGNOSIS — M778 Other enthesopathies, not elsewhere classified: Secondary | ICD-10-CM

## 2022-11-23 DIAGNOSIS — D225 Melanocytic nevi of trunk: Secondary | ICD-10-CM | POA: Diagnosis not present

## 2022-11-23 DIAGNOSIS — L814 Other melanin hyperpigmentation: Secondary | ICD-10-CM | POA: Diagnosis not present

## 2022-11-23 DIAGNOSIS — D689 Coagulation defect, unspecified: Secondary | ICD-10-CM

## 2022-11-23 DIAGNOSIS — L821 Other seborrheic keratosis: Secondary | ICD-10-CM | POA: Diagnosis not present

## 2022-11-23 MED ORDER — TRIAMCINOLONE ACETONIDE 10 MG/ML IJ SUSP
10.0000 mg | Freq: Once | INTRAMUSCULAR | Status: AC
Start: 2022-11-23 — End: 2022-11-23
  Administered 2022-11-23: 10 mg via INTRA_ARTICULAR

## 2022-11-23 NOTE — Progress Notes (Signed)
Subjective:   Patient ID: Carlos Dawson, male   DOB: 82 y.o.   MRN: [id number]   HPI Patient presents with a lesion underneath the left first metatarsal and one on the third digits of both feet that become sore and hard for him to walk on and states he has tried to cut it but he is on a blood thinner and does not feel comfortable.  Patient does not currently smoke moderate obesity tries to be active   Review of Systems  All other systems reviewed and are negative.       Objective:  Physical Exam Vitals and nursing note reviewed.  Constitutional:      Appearance: He is well-developed.  Pulmonary:     Effort: Pulmonary effort is normal.  Musculoskeletal:        General: Normal range of motion.  Skin:    General: Skin is warm.  Neurological:     Mental Status: He is alert.     Neurovascular status found to be intact muscle strength was found to be adequate range of motion adequate with patient found to have relative plantarflexion first metatarsal left with keratotic lesion fluid buildup around the head of the metatarsal that is painful when pressed.  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  For the inflamed fluid capsule first MPJ I did do sterile prep injected the plantar capsule 3 mg dexamethasone Kenalog I then debrided 3 separate lesions sharp instrumentation no iatrogenic bleeding with the patient at high risk on blood thinner.  Patient to be seen back and may require eventual sesamoidectomy or elevating osteotomy  X-rays indicate that there is some plantarflexion of the metatarsal with possible hypertrophy tibial sesamoid reviewed

## 2022-12-05 ENCOUNTER — Other Ambulatory Visit: Payer: Self-pay

## 2022-12-05 MED ORDER — LOSARTAN POTASSIUM 25 MG PO TABS: 25.0000 mg | ORAL_TABLET | Freq: Every day | ORAL | 3 refills | Status: DC

## 2022-12-06 NOTE — Progress Notes (Addendum)
[facility]                                     Cardiology Office Note:    Date:  12/11/2022   ID:  Carlos Dawson, DOB 27-Aug-1940, MRN [id number]  PCP:  Carlos Ates, MD  [facility] Cardiologist:  Carlos [name], MD  [facility] Electrophysiologist:  Carlos Prude, MD   Referring MD: Carlos Ates, MD   Chief Complaint  Patient presents with   6 month s/p LAAO    History of Present Illness:    Carlos Dawson is a 82 y.o. male with a hx of CAD s/p BMS to 2nd OM 2012, chronic diastolic CHF, prior PE, HTN, HLD, DM2, thoracic aneurysm, and PAF s/p LAAO with Watchman and is here for 6 month follow up.    [name] has been followed by Dr. [name] for his cardiology care. He underwent AF ablation with Dr. Johney Dawson in 2019 and has since been having issue with hematuria with chronic anticoagulation. He was recently hospitalized 03/24/22 with recurrent hematuria after being seen by urology with foley cathter placement. It was recommended that he stop his Xarelto at that time. Given this, he was referred to Dr. Lalla Dawson for LAAO closure. CT imaging showed anatomy suitable for implant and underwent LAAO with a Watchman FLX Pro 24mm device on 06/15/2022. He was started on Plavix 75mg  QD one week prior to procedure and was loaded with ASA 325 at the time of implant with plans to continue DAPT with ASA and Plavix through 6 months post implant (12/16/22).    I saw him for one month follow up at which time he was doing well. He then called the office mid-August with reports of AF on his Apple Watch. ZIO monitor showed NSR with frequent PACs and runs of SVT with one pause lasting 3.6 sec with an ill-defined baseline rhythm not felt to represent atrial fibrillation.   He was seen several times recently for chest pain with echo showing normal LV function with mild LVH, trivial MR, and moderate aortic dilation at the root measuring 47mm and severe dilation at the ascending aorta  measuring 52mm. Subsequent cardiac PET 10/29 reviewed by Carlos Fabian, NP and Dr. [name] with no acute changes and felt to be low risk. There was a new pulmonary nodule noted in the RUL. He follows with Carlos Spearing, NP in February 2025. Otherwise he reports he is doing ok with no SOB, palpitations, LE edema, orthopnea, PND, dizziness, or syncope. Denies bleeding in stool or urine. Wife is having knee replacement next week and he is concerned about this.   Past Medical History:  Diagnosis Date   Anticoagulant long-term use    Aortic root enlargement (HCC)    Ascending aortic aneurysm (HCC)    recent scan in October 2012 showing no change; followed by Dr. Tyrone Dawson   ASCVD (arteriosclerotic cardiovascular disease)    Prior BMS to the 2nd OM in September 2012; with repeat cath in October showing patency   CAD (coronary artery disease)    a. s/p BMS to 2nd OM in Sept 2012; b. LexiScan Myoview (12/2012):  Inf infarct; bowel and motion artifact make study difficult to interpret; no ischemia; not gated; Low Risk   CHF (congestive heart failure) (HCC)    no recent issues 10/13/14   Chronic back pain    "all over  my back" (05/11/2017)   Colonic polyp    Contact lens/glasses fitting    Diastolic dysfunction    DVT (deep venous thrombosis) (HCC)    ?LLE   Frequent headaches    "probably weekly" (05/11/2017)   Generalized headaches    neck stenosis   GERD (gastroesophageal reflux disease)    Hearing loss    Hearing loss    more so on left   Hemorrhoids    History of stomach ulcers    Hypertension    IBS (irritable bowel syndrome)    LVH (left ventricular hypertrophy)    Mild intermittent asthma    OA (osteoarthritis)    "all over" (05/11/2017)   Obesities, morbid (HCC)    OSA (obstructive sleep apnea)    PSG 03/30/97 AHI 21, BPAP 13/9   OSA on CPAP    PAF (paroxysmal atrial fibrillation) (HCC)    a. on Xarelto b. s/p DCCV in 08/2016; b. Tikosyn failed 04/16/17 with plans for Multaq and  possible Afib ablation with Dr. Johney Dawson   Pneumonia    'several times" (05/11/2017)   Presence of Watchman left atrial appendage closure device 06/15/2022   24mm Watchman FLX Pro placed by Dr. Lalla Dawson   Prostate CA [facility])    Oncologist  DR. Jacquelynn [facility] dx 09/24/14, undetermined tx   prostate; S/P "radiation and hormone injections"   Pulmonary embolism (HCC) 2008   "both lungs"   SOB (shortness of breath)    on excertion   Thoracic aortic aneurysm (HCC)    Aortic Size Index=     5.0    /Body surface area is 2.43 meters squared. = 2.05  < 2.75 cm/m2      4% risk per year 2.75 to 4.25          8% risk per year > 4.25 cm/m2    20% risk per year   Stable aneurysmal dilation of the ascending aorta with maximum AP diameter of 4.8 cm. Stable area of narrowing of the proximal most portion of the descending aorta measuring 2 cm., previously identified as an area of coarctation. No evidence of aortic dissection.  Coronary artery disease.  Normal appearance of the lungs.   Electronically Signed   By: Carlos Dawson M.D.   On: 10/01/2014 08:50     Type II diabetes mellitus (HCC)    metphormin, average 154 dx 2017    Past Surgical History:  Procedure Laterality Date   ACHILLES TENDON REPAIR Bilateral    AORTIC ARCH ANGIOGRAPHY N/A 03/13/2017   Procedure: AORTIC ARCH ANGIOGRAPHY;  Surgeon: Dawson, Carlos M, MD;  Location: [facility] INVASIVE CV LAB;  Service: Cardiovascular;  Laterality: N/A;   APPENDECTOMY     ATRIAL FIBRILLATION ABLATION  05/11/2017   ATRIAL FIBRILLATION ABLATION N/A 05/11/2017   Procedure: ATRIAL FIBRILLATION ABLATION;  Surgeon: Hillis Range, MD;  Location: [facility] INVASIVE CV LAB;  Service: Cardiovascular;  Laterality: N/A;   BACK SURGERY     "I've had 7 back and 1 neck ORs" (05/11/2017)   BIOPSY  03/14/2018   Procedure: BIOPSY;  Surgeon: Kerin Salen, MD;  Location: [facility] ENDOSCOPY;  Service: Gastroenterology;;  EGD and Colon   CARDIAC CATHETERIZATION  2006   CARPAL TUNNEL RELEASE Bilateral     LEFT   CATARACT EXTRACTION W/ INTRAOCULAR LENS  IMPLANT, BILATERAL Bilateral    CERVICAL SPINE SURGERY  06/02/2010   lower back and neck   COLONOSCOPY N/A 03/14/2018   Procedure: COLONOSCOPY;  Surgeon: Kerin Salen, MD;  Location: [facility]  ENDOSCOPY;  Service: Gastroenterology;  Laterality: N/A;   COLONOSCOPY WITH PROPOFOL N/A 12/29/2014   Procedure: COLONOSCOPY WITH PROPOFOL;  Surgeon: Charolett Bumpers, MD;  Location: [facility] ENDOSCOPY;  Service: Endoscopy;  Laterality: N/A;   CORONARY ANGIOPLASTY WITH STENT PLACEMENT  October 2012   CORONARY STENT PLACEMENT  Sept 2012   2nd OM with BMS   ESOPHAGOGASTRODUODENOSCOPY N/A 03/14/2018   Procedure: ESOPHAGOGASTRODUODENOSCOPY (EGD);  Surgeon: Kerin Salen, MD;  Location: [facility] ENDOSCOPY;  Service: Gastroenterology;  Laterality: N/A;   HEMORROIDECTOMY     LAMINECTOMY  05/30/2012   L 4 L5   LAMINECTOMY WITH POSTERIOR LATERAL ARTHRODESIS LEVEL 3 N/A 10/18/2016   Procedure: Posterior Lateral Fusion - Lumbar One-Four, segmental instrumentation Lumbar One-Five,  decompression,;  Surgeon: Tia Alert, MD;  Location: [facility] OR;  Service: Neurosurgery;  Laterality: N/A;   LAPAROSCOPIC CHOLECYSTECTOMY     LAPAROSCOPIC GASTRIC BANDING     LEFT AND RIGHT HEART CATHETERIZATION WITH CORONARY ANGIOGRAM N/A 05/07/2014   Procedure: LEFT AND RIGHT HEART CATHETERIZATION WITH CORONARY ANGIOGRAM;  Surgeon: Carlos M [name], MD;  Location: [facility] CATH LAB;  Service: Cardiovascular;  Laterality: N/A;   LEFT ATRIAL APPENDAGE OCCLUSION N/A 06/15/2022   Procedure: LEFT ATRIAL APPENDAGE OCCLUSION;  Surgeon: Carlos Prude, MD;  Location: [facility] INVASIVE CV LAB;  Service: Cardiovascular;  Laterality: N/A;   LEFT HEART CATH AND CORONARY ANGIOGRAPHY N/A 03/13/2017   Procedure: LEFT HEART CATH AND CORONARY ANGIOGRAPHY;  Surgeon: Dawson, Carlos M, MD;  Location: [facility] INVASIVE CV LAB;  Service: Cardiovascular;  Laterality: N/A;   LUMBAR LAMINECTOMY/DECOMPRESSION MICRODISCECTOMY N/A 05/04/2016   Procedure:  Laminectomy and Foraminotomy - Thoracic twelve-Lumbar one -Posterior Fusion Lumbar one-two;  Surgeon: Tia Alert, MD;  Location: [facility] OR;  Service: Neurosurgery;  Laterality: N/A;   POLYPECTOMY  03/14/2018   Procedure: POLYPECTOMY;  Surgeon: Kerin Salen, MD;  Location: [facility] ENDOSCOPY;  Service: Gastroenterology;;   POSTERIOR LUMBAR FUSION  10/18/2016   SHOULDER OPEN ROTATOR CUFF REPAIR Bilateral    TEE WITHOUT CARDIOVERSION N/A 06/15/2022   Procedure: TRANSESOPHAGEAL ECHOCARDIOGRAM;  Surgeon: Carlos Prude, MD;  Location: [facility] INVASIVE CV LAB;  Service: Cardiovascular;  Laterality: N/A;   TONSILLECTOMY AND ADENOIDECTOMY     TOTAL HIP ARTHROPLASTY Right 10/18/2018   Procedure: RIGHT TOTAL HIP ARTHROPLASTY ANTERIOR APPROACH;  Surgeon: Kathryne Hitch, MD;  Location: [facility] ORS;  Service: Orthopedics;  Laterality: Right;   TRIGGER FINGER RELEASE     LEFT   UVULOPALATOPHARYNGOPLASTY     VASECTOMY      Current Medications: Current Meds  Medication Sig   acetaminophen (TYLENOL) 500 MG tablet Take 1,000 mg by mouth every 8 (eight) hours as needed for mild pain or headache.    albuterol (VENTOLIN HFA) 108 (90 Base) MCG/ACT inhaler Inhale 2 puffs into the lungs every 4 (four) hours as needed for wheezing or shortness of breath.   aspirin EC 81 MG tablet Take 1 tablet (81 mg total) by mouth daily. Swallow whole.   baclofen (LIORESAL) 10 MG tablet Take 10 mg by mouth 3 (three) times daily as needed for muscle spasms.   budesonide-formoterol (SYMBICORT) 160-4.5 MCG/ACT inhaler Inhale 2 puffs into the lungs daily as needed (Asthma).   dextromethorphan-guaiFENesin (MUCINEX DM) 30-600 MG 12hr tablet Take 2 tablets by mouth as needed for cough (Cold).   diphenoxylate-atropine (LOMOTIL) 2.5-0.025 MG tablet Take 1 tablet by mouth 4 (four) times daily as needed for diarrhea or loose stools.   fluticasone (FLONASE) 50 MCG/ACT nasal spray Place 1 spray  into both nostrils as needed for allergies or rhinitis.    furosemide (LASIX) 40 MG tablet TAKE ONE TABLET (40MG ) BY MOUTH EACH DAY AS NEEDED FOR SWELLING OR WEIGHT GAIN. (Patient taking differently: Take 40 mg by mouth daily as needed for fluid or edema.)   Iron, Ferrous Sulfate, 75 (15 Fe) MG/ML SOLN Take 75 mg by mouth every other day.   JARDIANCE 25 MG TABS tablet Take 25 mg by mouth daily.   losartan (COZAAR) 25 MG tablet Take 1 tablet (25 mg total) by mouth daily.   methocarbamol (ROBAXIN) 500 MG tablet Take 1 tablet (500 mg total) by mouth every 8 (eight) hours as needed for muscle spasms.   metoprolol tartrate (LOPRESSOR) 25 MG tablet Take 12.5 mg by mouth 2 (two) times daily.   nitroGLYCERIN (NITROSTAT) 0.4 MG SL tablet Place 1 tablet (0.4 mg total) under the tongue every 5 (five) minutes as needed for chest pain.   pantoprazole (PROTONIX) 40 MG tablet TAKE ONE TABLET BY MOUTH BEFORE BREAKFAST (TAKE ON AN EMPTY STOMACH 30 MINUTES PRIOR TO A MEAL)   Polyethyl Glycol-Propyl Glycol (SYSTANE OP) Place 1 drop into both eyes daily as needed (for dry eyes).    potassium chloride SA (KLOR-CON M20) 20 MEQ tablet Taking two tablet by mouth in the am and 1 tablet in the evening   psyllium (METAMUCIL) 58.6 % packet Take 1 packet by mouth daily as needed (Looose stool).   sildenafil (VIAGRA) 100 MG tablet Take 1 tablet (100 mg total) by mouth as needed for erectile dysfunction.   Skin Protectants, Misc. (EUCERIN) cream Apply 1 application topically as needed for dry skin.   spironolactone (ALDACTONE) 25 MG tablet Take 1 tablet (25 mg total) by mouth daily.   tamsulosin (FLOMAX) 0.4 MG CAPS Take 0.4 mg by mouth every evening.    Tiotropium Bromide Monohydrate (SPIRIVA RESPIMAT) 2.5 MCG/ACT AERS Inhale 2 puffs into the lungs daily.   topiramate (TOPAMAX) 25 MG capsule Take 50 mg by mouth 2 (two) times daily.    [DISCONTINUED] amoxicillin (AMOXIL) 500 MG capsule TAKE 4 CAPSULES BY MOUTH AS DIRECTED. TAKE 4 TABLETS 1 HOUR PRIOR TO DENTAL WORK, INCLUDING  CLEANINGS.     Allergies:   Other, Lotensin [benazepril], Tofranil [imipramine], Ace inhibitors, Adhesive [tape], Amoxicillin-pot clavulanate, Codeine, Latex, Metformin, Morphine, Nifedipine, and Quinolones   Social History   Socioeconomic History   Marital status: Married    Spouse name: Not on file   Number of children: 3   Years of education: Not on file   Highest education level: Not on file  Occupational History   Occupation: Retired from [employer], [employer]: RETIRED  Tobacco Use   Smoking status: Former    Current packs/day: 0.00    Average packs/day: 1.5 packs/day for 36.0 years (54.0 ttl pk-yrs)    Types: Cigarettes    Start date: 34    Quit date: 01/24/1992    Years since quitting: 30.9   Smokeless tobacco: Never  Vaping Use   Vaping status: Never Used  Substance and Sexual Activity   Alcohol use: No   Drug use: No   Sexual activity: Not Currently  Other Topics Concern   Not on file  Social History Narrative   Not on file   Social Determinants of Health   Financial Resource Strain: Not on file  Food Insecurity: No Food Insecurity (06/15/2022)   Hunger Vital Sign    Worried About Running Out of Food in the Last Year:  Never true    Ran Out of Food in the Last Year: Never true  Transportation Needs: No Transportation Needs (06/15/2022)   PRAPARE - Administrator, Civil Service (Medical): No    Lack of Transportation (Non-Medical): No  Physical Activity: Not on file  Stress: Not on file  Social Connections: Unknown (06/05/2021)   [facility] from [facility]   Social Network    Social Network: Not on file     Family History: The patient's family history includes Diabetes in his mother; Emphysema (age of onset: 93) in his father; Heart attack in his sister; Heart disease in his mother; Other in his mother.  ROS:   Please see the history of present illness.    All other systems reviewed and are negative.  EKGs/Labs/Other Studies  Reviewed:    The following studies were reviewed today:  Cardiac Studies & Procedures   CARDIAC CATHETERIZATION  CARDIAC CATHETERIZATION 03/13/2017  Narrative  Previously placed Ost 2nd Mrg to 2nd Mrg stent (unknown type) is widely patent.  There is no aortic valve regurgitation.  1. No significant obstructive CAD 2. Aneurysmal thoracic aorta.  Plan: continue medical management . Consider pulmonary evaluation for symptoms of dyspnea.  Findings Coronary Findings Diagnostic  Dominance: Left  Left Main Vessel was injected. Vessel is normal in caliber. Vessel is angiographically normal. The vessel originates from a separate ostium.  Left Anterior Descending Vessel was injected. Vessel is small. Vessel is angiographically normal. There is mild focal disease in the vessel.  Second Septal Branch  Left Circumflex Vessel was injected. Vessel is large. Vessel is angiographically normal.  Second Obtuse Marginal Branch Previously placed Ost 2nd Mrg to 2nd Mrg stent (unknown type) is widely patent.  Right Coronary Artery Vessel was injected. Vessel is small. Vessel is angiographically normal.  Intervention  No interventions have been documented.   STRESS TESTS  NM PET CT CARDIAC PERFUSION MULTI W/ABSOLUTE BLOODFLOW 11/21/2022  Narrative   Findings on perfusion images are equivocal. The study is high risk due to marked reduction in myocardial blood flow reserve, particularly in the LAD and circumflex artery distributions.   LV perfusion is equivocal.   Rest left ventricular function is normal. Rest EF: 55%. Stress left ventricular function is normal. Stress EF: 58%. End diastolic cavity size is normal. End systolic cavity size is normal.   Myocardial blood flow was computed to be 0.65ml/g/min at rest and 1.54ml/g/min at stress. Global myocardial blood flow reserve was 1.57 and was abnormal.  Myocardial blood flow reserve is especially decreased in the LAD artery territory (1.35)  and the LCX territory (1.60), while it is relatively normal in the RCA territory (1.99)   Coronary calcium assessment not performed due to prior revascularization and artifact from a Watchman device   Note dilation of the ascending aorta on CT images.   Electronically signed by Thurmon Fair, MD  CLINICAL DATA:  This over-read does not include interpretation of cardiac or coronary anatomy or pathology. The Cardiac PET CT interpretation by the cardiologist is attached.  COMPARISON:  Chest CTA dated November 30, 2021  FINDINGS: Vascular: Mild cardiomegaly. No pericardial effusion. Dilated ascending thoracic aorta, measuring up to 4.9 cm. Moderate atherosclerotic disease of the thoracic aorta. Moderate coronary artery calcifications. Left atrial occlusion device.  Mediastinum/Nodes: Esophagus is unremarkable. No enlarged lymph nodes seen in the chest.  Lungs/Pleura: Central airways are patent. No consolidation, pleural effusion or pneumothorax. Small solid pulmonary nodule of the right upper lobe measuring  4 mm on series 4, image 9, not seen on prior exams. Stable solid pulmonary nodule of the right middle lobe measuring 5 mm on series 4, image 23.  Upper Abdomen: Gastric band device place.  No acute abnormality.  Musculoskeletal: Partially visualized orthopedic hardware of the thoracolumbar spine. No aggressive appearing osseous lesions.  IMPRESSION: 1. Stable aneurysmal dilation of the ascending thoracic aorta measuring up to 4.9 cm. Recommend semi-annual imaging followup by CTA or MRA and referral to cardiothoracic surgery if not already obtained. This recommendation follows 2010 ACCF/AHA/AATS/ACR/ASA/SCA/SCAI/SIR/STS/SVM Guidelines for the diagnosis and Management of Patients With Thoracic Aortic Disease. Circulation. 2010; 121: M010-U725 2. New small solid pulmonary nodule of the right upper lobe measuring 5 mm. Recommend attention on follow-up. 3. Aortic Atherosclerosis  (ICD10-I70.0).   Electronically Signed By: Allegra Lai M.D. On: 11/21/2022 09:52   ECHOCARDIOGRAM  ECHOCARDIOGRAM COMPLETE 10/26/2022  Narrative ECHOCARDIOGRAM REPORT    Patient Name:   TORIBIO OVANDO Date of Exam: 10/26/2022 Medical Rec #:  [id number]        Height:       67.0 in Accession #:    [id number]       Weight:       247.4 lb Date of Birth:  1940/03/23        BSA:          2.214 m Patient Age:    82 years         BP:           102/84 mmHg Patient Gender: M                HR:           53 bpm. Exam Location:  Outpatient  Procedure: 2D Echo, Strain Analysis, 3D Echo, Cardiac Doppler and Color Doppler  Indications:    R07.81 Pleurodynia; R06.9 DOE; R60.0 Lower extremity edema  History:        Patient has prior history of Echocardiogram examinations, most recent 06/15/2022. CHF, CAD, Watchman's procedure 06/15/2022, Arrythmias:Atrial Fibrillation, Signs/Symptoms:Chest Pain, Dyspnea and Edema; Risk Factors:Hypertension, Dyslipidemia, CKD stage 3, Sleep Apnea and Diabetes. Patient has had intermittent left chest pain, DOE and left leg edema.  Sonographer:    Carlos American RVT, RDCS (AE), RDMS Referring Phys: [id number] EMILY C MONGE  IMPRESSIONS   1. Left ventricular ejection fraction, by estimation, is 60 to 65%. The left ventricle has normal function. The left ventricle has no regional wall motion abnormalities. There is mild concentric left ventricular hypertrophy. Left ventricular diastolic parameters are consistent with Grade I diastolic dysfunction (impaired relaxation). Elevated left atrial pressure. The average left ventricular global longitudinal strain is -16.7 %. The global longitudinal strain is abnormal. 2. Right ventricular systolic function is normal. The right ventricular size is normal. 3. Left atrial size was mildly dilated. 4. The mitral valve is normal in structure. Trivial mitral valve regurgitation. No evidence of mitral stenosis. 5. The aortic  valve is bicuspid. Aortic valve regurgitation is not visualized. Mild aortic valve stenosis. 6. Aortic dilatation noted. There is moderate dilatation of the aortic root, measuring 47 mm. There is severe dilatation of the ascending aorta, measuring 52 mm. 7. The inferior vena cava is normal in size with greater than 50% respiratory variability, suggesting right atrial pressure of 3 mmHg.  Comparison(s): EF 60%, mild LVH, RVSP 47.2 mmHg, bicuspid AOV, mean 13.0, peak 23.7 mmHg, AOV 47mm, asc aor 47 mm, minimal coarctation seen.  FINDINGS Left Ventricle: Left ventricular ejection fraction, by  estimation, is 60 to 65%. The left ventricle has normal function. The left ventricle has no regional wall motion abnormalities. The average left ventricular global longitudinal strain is -16.7 %. The global longitudinal strain is abnormal. The left ventricular internal cavity size was normal in size. There is mild concentric left ventricular hypertrophy. Left ventricular diastolic parameters are consistent with Grade I diastolic dysfunction (impaired relaxation). Elevated left atrial pressure.  Right Ventricle: The right ventricular size is normal. Right ventricular systolic function is normal.  Left Atrium: Left atrial size was mildly dilated.  Right Atrium: Right atrial size was normal in size.  Pericardium: There is no evidence of pericardial effusion.  Mitral Valve: The mitral valve is normal in structure. Trivial mitral valve regurgitation. No evidence of mitral valve stenosis.  Tricuspid Valve: The tricuspid valve is normal in structure. Tricuspid valve regurgitation is trivial. No evidence of tricuspid stenosis.  Aortic Valve: The aortic valve is bicuspid. Aortic valve regurgitation is not visualized. Mild aortic stenosis is present. Aortic valve mean gradient measures 9.0 mmHg. Aortic valve peak gradient measures 17.0 mmHg. Aortic valve area, by VTI measures 3.05 cm.  Pulmonic Valve: The pulmonic  valve was normal in structure. Pulmonic valve regurgitation is trivial. No evidence of pulmonic stenosis.  Aorta: Aortic dilatation noted. There is moderate dilatation of the aortic root, measuring 47 mm. There is severe dilatation of the ascending aorta, measuring 52 mm.  Venous: The inferior vena cava is normal in size with greater than 50% respiratory variability, suggesting right atrial pressure of 3 mmHg.  IAS/Shunts: The interatrial septum is aneurysmal. No atrial level shunt detected by color flow Doppler.   LEFT VENTRICLE PLAX 2D LVIDd:         5.07 cm   Diastology LVIDs:         3.29 cm   LV e' medial:    4.87 cm/s LV PW:         1.22 cm   LV E/e' medial:  19.7 LV IVS:        1.07 cm   LV e' lateral:   5.43 cm/s LVOT diam:     2.75 cm   LV E/e' lateral: 17.7 LV SV:         139 LV SV Index:   63        2D Longitudinal Strain LVOT Area:     5.94 cm  2D Strain GLS Avg:     -16.7 %  3D Volume EF: 3D EF:        67 % LV EDV:       157 ml LV ESV:       52 ml LV SV:        105 ml  RIGHT VENTRICLE RV S prime:     14.20 cm/s TAPSE (M-mode): 3.2 cm  LEFT ATRIUM              Index        RIGHT ATRIUM           Index LA diam:        3.80 cm  1.72 cm/m   RA Area:     20.60 cm LA Vol (A2C):   136.0 ml 61.44 ml/m  RA Volume:   58.60 ml  26.47 ml/m LA Vol (A4C):   82.3 ml  37.18 ml/m LA Biplane Vol: 116.0 ml 52.40 ml/m AORTIC VALVE  PULMONIC VALVE AV Area (Vmax):    3.11 cm      PV Vmax:          0.99 m/s AV Area (Vmean):   2.81 cm      PV Peak grad:     3.9 mmHg AV Area (VTI):     3.05 cm      PR End Diast Vel: 4.49 msec AV Vmax:           206.00 cm/s AV Vmean:          136.500 cm/s AV VTI:            0.455 m AV Peak Grad:      17.0 mmHg AV Mean Grad:      9.0 mmHg LVOT Vmax:         108.00 cm/s LVOT Vmean:        64.500 cm/s LVOT VTI:          0.234 m LVOT/AV VTI ratio: 0.51  AORTA Ao Root diam: 4.70 cm Ao Asc diam:  5.15 cm  MITRAL VALVE                TRICUSPID VALVE MV Area (PHT): 3.24 cm    TR Peak grad:   29.6 mmHg MV Decel Time: 235 msec    TR Vmax:        272.00 cm/s MV E velocity: 95.90 cm/s MV A velocity: 94.65 cm/s  SHUNTS MV E/A ratio:  1.01        Systemic VTI:  0.23 m Systemic Diam: 2.75 cm  Olga Millers MD Electronically signed by Olga Millers MD Signature Date/Time: 10/26/2022/3:15:34 PM    Final   TEE  ECHO TEE 06/15/2022  Narrative TRANSESOPHOGEAL ECHO REPORT    Patient Name:   COLLINS VANDENBOS Date of Exam: 06/15/2022 Medical Rec #:  [id number]        Height:       67.0 in Accession #:    [id number]       Weight:       243.0 lb Date of Birth:  1940/12/05        BSA:          2.197 m Patient Age:    81 years         BP:           121/71 mmHg Patient Gender: M                HR:           49 bpm. Exam Location:  Inpatient  Procedure: Transesophageal Echo, Color Doppler, Cardiac Doppler, Echo Assisted Procedure and 3D Echo  Indications:     Watchman Placement  History:         Patient has prior history of Echocardiogram examinations, most recent 03/31/2022. CHF, CAD, Arrythmias:Atrial Fibrillation, Signs/Symptoms:Dyspnea and Chest Pain; Risk Factors:Hypertension, Dyslipidemia and Diabetes.  Sonographer:     Milbert Coulter Referring Phys:  [id number] Carlos Dawson Diagnosing Phys: Thurmon Fair MD  PROCEDURE: After discussion of the risks and benefits of a TEE, an informed consent was obtained from the patient. The patient was intubated. The transesophogeal probe was passed without difficulty through the esophogus of the patient. Imaged were obtained with the patient in a supine position. Sedation performed by different physician. The patient was monitored while under deep sedation. Anesthestetic sedation was provided intravenously by Anesthesiology: 70mg  of Propofol, 20mg  of Lidocaine. Image quality was adequate. The patient's vital signs;  including heart rate, blood pressure, and oxygen  saturation; remained stable throughout the procedure. The patient developed no complications during the procedure. Transgastric views not obtained: inability to pass probe.   Transesophageal echocardiography including life and reconstructed 3D images was used to guide transseptal puncture, device delivery and screen for complications. At the end of the procedure there is a well-positioned Watchman flx 24 mm device at the ostium of the left atrial appendage without visible leak and with adequate compression.  IMPRESSIONS   1. Left ventricular ejection fraction, by estimation, is 60 to 65%. The left ventricle has normal function. The left ventricle has no regional wall motion abnormalities. There is mild concentric left ventricular hypertrophy. 2. Right ventricular systolic function is normal. The right ventricular size is normal. There is moderately elevated pulmonary artery systolic pressure. The estimated right ventricular systolic pressure is 47.2 mmHg. 3. Left atrial size was mildly dilated. No left atrial/left atrial appendage thrombus was detected. 4. Right atrial size was mildly dilated. 5. The mitral valve is normal in structure. Trivial mitral valve regurgitation. 6. Tricuspid valve regurgitation is mild to moderate. 7. Unable to measure aortic valve gradients, unable to cross into the stomach with the probe. By planimetry the aortic valve area is around 2.1 cm. Sievers type 0 bicuspid aortic valveUnable to measure aortic valve gradients, unable to cross into the stomach with the probe. By planimetry the aortic valve area is around 2.1 cm. Sievers type 0 bicuspid aortic valve. The aortic valve is bicuspid. Aortic valve regurgitation is not visualized. 8. There is mild coarctation immediately distal to the origin of the left subclavian artery with a minimal aortic diameter of 23 mm, subsequently expanding to a small area of poststenotic dilatation at 31 millimeters. The coarctation does  not appear to be hemodynamically important. Aortic dilatation noted. There is moderate dilatation of the aortic root, measuring 47 mm. 9. Mildly dilated pulmonary artery.  FINDINGS Left Ventricle: Left ventricular ejection fraction, by estimation, is 60 to 65%. The left ventricle has normal function. The left ventricle has no regional wall motion abnormalities. The left ventricular internal cavity size was normal in size. There is mild concentric left ventricular hypertrophy.  Right Ventricle: The right ventricular size is normal. No increase in right ventricular wall thickness. Right ventricular systolic function is normal. There is moderately elevated pulmonary artery systolic pressure. The tricuspid regurgitant velocity is 3.05 m/s, and with an assumed right atrial pressure of 10 mmHg, the estimated right ventricular systolic pressure is 47.2 mmHg.  Left Atrium: At the end of the procedure there is a small iatrogenic secundum atrial septal defect with minimal left-to-right shunt. Left atrial size was mildly dilated. No left atrial/left atrial appendage thrombus was detected.  Right Atrium: Right atrial size was mildly dilated.  Pericardium: There is no evidence of pericardial effusion.  Mitral Valve: The mitral valve is normal in structure. Trivial mitral valve regurgitation.  Tricuspid Valve: The tricuspid valve is normal in structure. Tricuspid valve regurgitation is mild to moderate.  Aortic Valve: Unable to measure aortic valve gradients, unable to cross into the stomach with the probe. By planimetry the aortic valve area is around 2.1 cm. Sievers type 0 bicuspid aortic valveUnable to measure aortic valve gradients, unable to cross into the stomach with the probe. By planimetry the aortic valve area is around 2.1 cm. Sievers type 0 bicuspid aortic valve. The aortic valve is bicuspid. Aortic valve regurgitation is not visualized.  Pulmonic Valve: The pulmonic valve was normal in  structure. Pulmonic valve regurgitation is trivial.  Aorta: There is mild coarctation immediately distal to the origin of the left subclavian artery with a minimal aortic diameter of 23 mm, subsequently expanding to a small area of poststenotic dilatation at 31 millimeters. The coarctation does not appear to be hemodynamically important. Aortic dilatation noted. There is moderate dilatation of the aortic root, measuring 47 mm.  Pulmonary Artery: The pulmonary artery is mildly dilated.  IAS/Shunts: No atrial level shunt detected by color flow Doppler.  Additional Comments: Spectral Doppler performed.  TRICUSPID VALVE TR Peak grad:   37.2 mmHg TR Vmax:        305.00 cm/s  Mihai Croitoru MD Electronically signed by Thurmon Fair MD Signature Date/Time: 06/15/2022/9:32:45 AM    Final   MONITORS  LONG TERM MONITOR (3-14 DAYS) 10/10/2022  Narrative   Normal sinus rhythm   Frequent PACs and runs of SVT longest 18 beats   One run of idioventricular rhythm rate 77 bpm   Few short runs of NSVT   Longest pause 3.6 seconds.   Although baseline is sometimes challenging no definite evidence of AFib   Patch Wear Time:  13 days and 20 hours (2024-08-26T14:02:53-0400 to 2024-09-09T10:51:32-0400)  Patient had a min HR of 44 bpm, max HR of 169 bpm, and avg HR of 63 bpm. Predominant underlying rhythm was Sinus Rhythm. First Degree AV Block was present. Bundle Branch Block/IVCD was present. 6 Ventricular Tachycardia runs occurred, the run with the fastest interval lasting 6 beats with a max rate of 152 bpm, the longest lasting 15 beats with an avg rate of 123 bpm. 6985 Supraventricular Tachycardia runs occurred, the run with the fastest interval lasting 6 beats with a max rate of 169 bpm, the longest lasting 18 beats with an avg rate of 129 bpm. 1 Pause occurred lasting 3.6 secs (16 bpm). Isolated SVEs were frequent (16.3%, [id number]), SVE Couplets were occasional (4.3%, [id number]), and SVE Triplets were  occasional (4.3%, 17816). Isolated VEs were rare (<1.0%, 1195), VE Couplets were rare (<1.0%, 27), and VE Triplets were rare (<1.0%, 1). Difficulty discerning atrial activity at times making definitive diagnosis difficult to ascertain.           EKG:  EKG is not ordered today.    Recent Labs: 02/21/2022: B Natriuretic Peptide 121.5 10/31/2022: ALT 9; BUN 25; Creatinine, Ser 1.03; Hemoglobin 11.7; Platelets 114; Potassium 4.6; Sodium 140   Recent Lipid Panel    Component Value Date/Time   CHOL 132 10/31/2022 0913   TRIG 42 10/31/2022 0913   HDL 61 10/31/2022 0913   CHOLHDL 2.2 10/31/2022 0913   CHOLHDL 3.0 01/07/2013 0410   VLDL 27 01/07/2013 0410   LDLCALC 61 10/31/2022 0913   Physical Exam:    VS:  BP 112/68   Pulse (!) 53   Ht 5\' 5"  (1.651 m)   Wt 246 lb (111.6 kg)   SpO2 95%   BMI 40.94 kg/m     Wt Readings from Last 3 Encounters:  12/11/22 246 lb (111.6 kg)  11/23/22 247 lb 12.8 oz (112.4 kg)  10/30/22 247 lb 9.6 oz (112.3 kg)    General: Well developed, well nourished, NAD Cardiovascular: RRR with S1 S2. No murmur Extremities: Chronic edema. Neuro: Alert and oriented. No focal deficits. No facial asymmetry. MAE spontaneously. Psych: Responds to questions appropriately with normal affect.    ASSESSMENT/PLAN:    PAF: s/p AF ablation and subsequent LAAO with Watchman 24mm FLX Pro device on 06/15/22. He is now  6 months post implant and may stop Plavix and continue lifelong ASA given CAD hx. No need for dental SBE. Plan regular follow up with Dr. [name] and we will touch base with him at his one year Watchman anniversary.    CAD: Hx of BMS to 2nd OM 2012. Recent cardiac PET reviewed by Dr. [name] and felt to be low risk. Continue ASA monotherapy.    Pulmonary nodule: Noted on prior CT imaging although CT from 11/2021 measuring 4mm RUL. CT 08/16/22 5mm ground glass nodule in the RLL. Patient has follow up with pulmonary in Feb 2025. Prior office note sent over for review  with plan to follow next year with repeat imaging.     Prostate cancer s/p radiation: Denies recent hematuria   Chronic diastolic CHF: Appears euvolemic on exam today. Continue Lasix at current dose.    Prior PE: Continue ASA.    I spent 15 minutes caring for this patient today including face-to-face discussions, ordering and reviewing labs, reviewing records from [facility] and other outside facilities, documenting in the record, and arranging for follow up.    ADDEND: Please note that Plavix was stopped at this appointment. Unclear why the medication did not appear on his medication list to be discontinued. Verbally discussed stopping Plavix and replacing with ASA 81mg  daily. Medication list was updated with ASA. Patient verbalized understanding.   Medication Adjustments/Labs and Tests Ordered: Current medicines are reviewed at length with the patient today.  Concerns regarding medicines are outlined above.  No orders of the defined types were placed in this encounter.  No orders of the defined types were placed in this encounter.   Patient Instructions  Medication Instructions:  You can stop Amoxicillin   *If you need a refill on your cardiac medications before your next appointment, please call your pharmacy*   Lab Work: None ordered   If you have labs (blood work) drawn today and your tests are completely normal, you will receive your results only by: MyChart Message (if you have MyChart) OR A paper copy in the mail If you have any lab test that is abnormal or we need to change your treatment, we will call you to review the results.   Testing/Procedures: None ordered    Follow-Up: At [facility], you and your health needs are our priority.  As part of our continuing mission to provide you with exceptional heart care, we have created designated Provider Care Teams.  These Care Teams include your primary Cardiologist (physician) and Advanced Practice Providers  (APPs -  Physician Assistants and Nurse Practitioners) who all work together to provide you with the care you need, when you need it.  We recommend signing up for the patient portal called "MyChart".  Sign up information is provided on this After Visit Summary.  MyChart is used to connect with patients for Virtual Visits (Telemedicine).  Patients are able to view lab/test results, encounter notes, upcoming appointments, etc.  Non-urgent messages can be sent to your provider as well.   To learn more about what you can do with MyChart, go to [email or web address].    Your next appointment:   5 month(s)  Provider:   Peter [name], MD     Other Instructions     Signed, Georgie Chard, NP  12/11/2022 8:40 AM    [facility]

## 2022-12-08 DIAGNOSIS — M25551 Pain in right hip: Secondary | ICD-10-CM | POA: Diagnosis not present

## 2022-12-08 DIAGNOSIS — M5416 Radiculopathy, lumbar region: Secondary | ICD-10-CM | POA: Diagnosis not present

## 2022-12-11 ENCOUNTER — Ambulatory Visit: Payer: Medicare Other | Attending: Internal Medicine | Admitting: Cardiology

## 2022-12-11 VITALS — BP 112/68 | HR 53 | Ht 65.0 in | Wt 246.0 lb

## 2022-12-11 DIAGNOSIS — I5032 Chronic diastolic (congestive) heart failure: Secondary | ICD-10-CM | POA: Insufficient documentation

## 2022-12-11 DIAGNOSIS — I480 Paroxysmal atrial fibrillation: Secondary | ICD-10-CM | POA: Diagnosis not present

## 2022-12-11 DIAGNOSIS — Z95818 Presence of other cardiac implants and grafts: Secondary | ICD-10-CM | POA: Diagnosis not present

## 2022-12-11 DIAGNOSIS — R911 Solitary pulmonary nodule: Secondary | ICD-10-CM | POA: Insufficient documentation

## 2022-12-11 DIAGNOSIS — I2510 Atherosclerotic heart disease of native coronary artery without angina pectoris: Secondary | ICD-10-CM | POA: Diagnosis not present

## 2022-12-11 DIAGNOSIS — I10 Essential (primary) hypertension: Secondary | ICD-10-CM | POA: Diagnosis not present

## 2022-12-11 NOTE — Patient Instructions (Signed)
Medication Instructions:  You can stop Amoxicillin   *If you need a refill on your cardiac medications before your next appointment, please call your pharmacy*   Lab Work: None ordered   If you have labs (blood work) drawn today and your tests are completely normal, you will receive your results only by: MyChart Message (if you have MyChart) OR A paper copy in the mail If you have any lab test that is abnormal or we need to change your treatment, we will call you to review the results.   Testing/Procedures: None ordered    Follow-Up: At [facility], you and your health needs are our priority.  As part of our continuing mission to provide you with exceptional heart care, we have created designated Provider Care Teams.  These Care Teams include your primary Cardiologist (physician) and Advanced Practice Providers (APPs -  Physician Assistants and Nurse Practitioners) who all work together to provide you with the care you need, when you need it.  We recommend signing up for the patient portal called "MyChart".  Sign up information is provided on this After Visit Summary.  MyChart is used to connect with patients for Virtual Visits (Telemedicine).  Patients are able to view lab/test results, encounter notes, upcoming appointments, etc.  Non-urgent messages can be sent to your provider as well.   To learn more about what you can do with MyChart, go to [email or web address].    Your next appointment:   5 month(s)  Provider:   Peter [name], MD     Other Instructions

## 2022-12-13 ENCOUNTER — Ambulatory Visit: Payer: Medicare Other

## 2022-12-14 DIAGNOSIS — N3941 Urge incontinence: Secondary | ICD-10-CM | POA: Diagnosis not present

## 2022-12-14 DIAGNOSIS — C61 Malignant neoplasm of prostate: Secondary | ICD-10-CM | POA: Diagnosis not present

## 2022-12-14 DIAGNOSIS — N1831 Chronic kidney disease, stage 3a: Secondary | ICD-10-CM | POA: Diagnosis not present

## 2023-01-25 DIAGNOSIS — M5416 Radiculopathy, lumbar region: Secondary | ICD-10-CM | POA: Diagnosis not present

## 2023-01-25 DIAGNOSIS — M4726 Other spondylosis with radiculopathy, lumbar region: Secondary | ICD-10-CM | POA: Diagnosis not present

## 2023-01-25 DIAGNOSIS — M4325 Fusion of spine, thoracolumbar region: Secondary | ICD-10-CM | POA: Diagnosis not present

## 2023-01-25 DIAGNOSIS — M1612 Unilateral primary osteoarthritis, left hip: Secondary | ICD-10-CM | POA: Diagnosis not present

## 2023-01-25 DIAGNOSIS — M963 Postlaminectomy kyphosis: Secondary | ICD-10-CM | POA: Diagnosis not present

## 2023-01-31 ENCOUNTER — Other Ambulatory Visit: Payer: Self-pay | Admitting: Orthopedic Surgery

## 2023-01-31 DIAGNOSIS — M4325 Fusion of spine, thoracolumbar region: Secondary | ICD-10-CM

## 2023-01-31 DIAGNOSIS — M1612 Unilateral primary osteoarthritis, left hip: Secondary | ICD-10-CM | POA: Diagnosis not present

## 2023-01-31 DIAGNOSIS — M5414 Radiculopathy, thoracic region: Secondary | ICD-10-CM

## 2023-02-20 ENCOUNTER — Telehealth: Payer: Self-pay | Admitting: Cardiology

## 2023-02-20 NOTE — Telephone Encounter (Signed)
Spoke with the patient who states that he has been having some atrial fibrillation recently. He states that his watch keeps alerting him. He reports that his heart rates have been stable in the 80s. Denies any palpitations or shortness of breath. He had a watchman placed last May 2024. He has an upcoming 6 month follow up appointment scheduled with Dr. [name]. He will keep that as appointment as scheduled.

## 2023-02-20 NOTE — Telephone Encounter (Signed)
Patient c/o Palpitations:  STAT if patient reporting lightheadedness, shortness of breath, or chest pain  How long have you had palpitations/irregular HR/ Afib? Are you having the symptoms now? 3 weeks, No Not right now  Are you currently experiencing lightheadedness, SOB or CP? no  Do you have a history of afib (atrial fibrillation) or irregular heart rhythm? yes  Have you checked your BP or HR? (document readings if available): BP has not checked HR 62-76  Are you experiencing any other symptoms? Pt's watch keeps telling him he is in afib

## 2023-02-21 ENCOUNTER — Ambulatory Visit
Admission: RE | Admit: 2023-02-21 | Discharge: 2023-02-21 | Disposition: A | Discharge status: Home / Self Care | Payer: Medicare Other | Source: Ambulatory Visit | Attending: Orthopedic Surgery | Admitting: Orthopedic Surgery

## 2023-02-21 DIAGNOSIS — M5414 Radiculopathy, thoracic region: Secondary | ICD-10-CM

## 2023-02-21 DIAGNOSIS — M4804 Spinal stenosis, thoracic region: Secondary | ICD-10-CM | POA: Diagnosis not present

## 2023-02-21 DIAGNOSIS — Z981 Arthrodesis status: Secondary | ICD-10-CM | POA: Diagnosis not present

## 2023-02-21 DIAGNOSIS — M4325 Fusion of spine, thoracolumbar region: Secondary | ICD-10-CM

## 2023-02-21 DIAGNOSIS — M48061 Spinal stenosis, lumbar region without neurogenic claudication: Secondary | ICD-10-CM | POA: Diagnosis not present

## 2023-02-23 DIAGNOSIS — M4325 Fusion of spine, thoracolumbar region: Secondary | ICD-10-CM | POA: Diagnosis not present

## 2023-02-23 DIAGNOSIS — M963 Postlaminectomy kyphosis: Secondary | ICD-10-CM | POA: Diagnosis not present

## 2023-02-23 DIAGNOSIS — M1612 Unilateral primary osteoarthritis, left hip: Secondary | ICD-10-CM | POA: Diagnosis not present

## 2023-02-23 NOTE — Progress Notes (Signed)
 Cardiology Office Note    Date:  02/27/2023   ID:  Carlos Dawson, DOB 19-Oct-1940, MRN [id number]  PCP:  Dwight Trula SQUIBB, MD  Cardiologist: Dr. Topaz Raglin   Chief Complaint  Patient presents with   Atrial Fibrillation   Coronary Artery Disease    History of Present Illness:    Carlos Dawson is a 83 y.o. male with past medical history of CAD (s/p BMS to 2nd OM in 2012, low-risk NST in 12/2012), Cath in 2019 with nonobstructive disease, PAF (on Xarelto ), chronic diastolic CHF, prior PE, HTN, HLD, Type 2 DM, and known thoracic aortic aneurysm who is seen for follow up.  He was admitted from 8/23 - 09/15/2016 for evaluation of chest discomfort and palpitations. His initial EKG showed that he was in atrial fibrillation with RVR. He reported good compliance with his Xarelto  and denied missing any doses, therefore a successful DCCV was performed. He became hypotensive with SBP in the 80's following the procedure, therefore he was admitted overnight for further observation. His Losartan , Spironolactone , and Lasix  were held at the time of discharge with plans to resume at follow-up if BP allowed. A CTA was also obtained during admission to rule out a recurrent PE and was negative for a PE but did show a stable 5.0 cm ascending thoracic aortic aneurysm. Antihypertensive therapy was resumed.   On follow up in February 2019   he noted increased symptoms of dyspnea on exertion and chest pressure for 3 months. Some improvement with inhalers.  No increase in edema or weight.  Oxygen  levels were good. He does use CPAP. Myoview  study was mildly abnormal with inferoapical reversible defect. EF 61%. Echo showed normal EF with moderate pulmonary HTN. Subsequent cardiac cath showed no obstructive CAD and no AI. Unable to cross AV due to distortion of the aortic root.  In April 2019 he developed increased symptoms of arrhythmia despite Tikosyn . He underwent EPS by Dr. Kelsie on 05/11/17. He had Afib and atrial  flutter ablations. When seen in follow up  he had significant improvement in Afib symptoms and some improvement in dyspnea. Multaq  was discontinued.   On 09/26/17 he had CPX to evaluate his dyspnea. This showed predominant limitation by obesity and deconditioning. He was seen by Dr. Shellia who recommended repeat Echo and if pulmonary pressures still elevated to consider right heart cath. Of note right heart cath in 2016 demonstrated mild pulmonary HTN with elevated PCWP c/w diastolic dysfunction. Diuretic therapy was increased at that time without significant clinical improvement.  He underwent THR in September 2020. Post op course complicated by blood loss anemia. He did develop Afib with rates into the 120s. Since then follow up in the Afib clinic demonstrated return of NSR. Last seen in AFib clinic on 11/24/19 and in NSR with PACs.   Last seen in Afib clinic in June and maintaining NSR. Was referred to Dr Cindie for consideration of a Watchman device so he could come off anticoagulation and be able to take anti-inflammatories to manage his pain. He was  hospitalized 03/24/22 with recurrent hematuria after being seen by urology with foley cathter placement. It was recommended that he stop his Xarelto  at that time. He subsequently  underwent LAAO with a Watchman FLX Pro 24mm device on 06/15/2022.    He then called the office mid-August 2024 with reports of AF on his Apple Watch. ZIO monitor showed NSR with frequent PACs and runs of SVT with one pause lasting 3.6 sec with  an ill-defined baseline rhythm not felt to represent atrial fibrillation. He was seen for chest pain. Echo in October showed normal LV function with mild LVH. Moderate aortic root enlargement 47 mm with dilation of ascending aorta to 52 mm. PET CT was low risk.   He had recent CT spine showing stable aneurysm at 49 mm. He notes Apple watch alarms at night for Afib. Not really aware of any symptoms. Review of strips shows some Afib and some NSR  with PACs. Denies any lightheadedness. Some swelling in right ankle. Occasional mild tightness in chest. He has lost 9 lbs.      Past Medical History:  Diagnosis Date   Anticoagulant long-term use    Aortic root enlargement (HCC)    Ascending aortic aneurysm (HCC)    recent scan in October 2012 showing no change; followed by Dr. Army   ASCVD (arteriosclerotic cardiovascular disease)    Prior BMS to the 2nd OM in September 2012; with repeat cath in October showing patency   CAD (coronary artery disease)    a. s/p BMS to 2nd OM in Sept 2012; b. LexiScan  Myoview  (12/2012):  Inf infarct; bowel and motion artifact make study difficult to interpret; no ischemia; not gated; Low Risk   CHF (congestive heart failure) (HCC)    no recent issues 10/13/14   Chronic back pain    all over my back (05/11/2017)   Colonic polyp    Contact lens/glasses fitting    Diastolic dysfunction    DVT (deep venous thrombosis) (HCC)    ?LLE   Frequent headaches    probably weekly (05/11/2017)   Generalized headaches    neck stenosis   GERD (gastroesophageal reflux disease)    Hearing loss    Hearing loss    more so on left   Hemorrhoids    History of stomach ulcers    Hypertension    IBS (irritable bowel syndrome)    LVH (left ventricular hypertrophy)    Mild intermittent asthma    OA (osteoarthritis)    all over (05/11/2017)   Obesities, morbid (HCC)    OSA (obstructive sleep apnea)    PSG 03/30/97 AHI 21, BPAP 13/9   OSA on CPAP    PAF (paroxysmal atrial fibrillation) (HCC)    a. on Xarelto  b. s/p DCCV in 08/2016; b. Tikosyn  failed 04/16/17 with plans for Multaq  and possible Afib ablation with Dr. Kelsie   Pneumonia    'several times (05/11/2017)   Presence of Watchman left atrial appendage closure device 06/15/2022   24mm Watchman FLX Pro placed by Dr. Cindie   Prostate CA [facility])    Oncologist  DR. lennis [facility] dx 09/24/14, undetermined tx   prostate; S/P radiation and hormone  injections   Pulmonary embolism (HCC) 2008   both lungs   SOB (shortness of breath)    on excertion   Thoracic aortic aneurysm (HCC)    Aortic Size Index=     5.0    /Body surface area is 2.43 meters squared. = 2.05  < 2.75 cm/m2      4% risk per year 2.75 to 4.25          8% risk per year > 4.25 cm/m2    20% risk per year   Stable aneurysmal dilation of the ascending aorta with maximum AP diameter of 4.8 cm. Stable area of narrowing of the proximal most portion of the descending aorta measuring 2 cm., previously identified as an area of coarctation. No evidence  of aortic dissection.  Coronary artery disease.  Normal appearance of the lungs.   Electronically Signed   By: Dobrinka  Dimitrova M.D.   On: 10/01/2014 08:50     Type II diabetes mellitus (HCC)    metphormin, average 154 dx 2017    Past Surgical History:  Procedure Laterality Date   ACHILLES TENDON REPAIR Bilateral    AORTIC ARCH ANGIOGRAPHY N/A 03/13/2017   Procedure: AORTIC ARCH ANGIOGRAPHY;  Surgeon: Anisia Leija M, MD;  Location: [facility] INVASIVE CV LAB;  Service: Cardiovascular;  Laterality: N/A;   APPENDECTOMY     ATRIAL FIBRILLATION ABLATION  05/11/2017   ATRIAL FIBRILLATION ABLATION N/A 05/11/2017   Procedure: ATRIAL FIBRILLATION ABLATION;  Surgeon: Kelsie Agent, MD;  Location: [facility] INVASIVE CV LAB;  Service: Cardiovascular;  Laterality: N/A;   BACK SURGERY     I've had 7 back and 1 neck ORs (05/11/2017)   BIOPSY  03/14/2018   Procedure: BIOPSY;  Surgeon: Saintclair Jasper, MD;  Location: [facility];  Service: Gastroenterology;;  EGD and Colon   CARDIAC CATHETERIZATION  2006   CARPAL TUNNEL RELEASE Bilateral    LEFT   CATARACT EXTRACTION W/ INTRAOCULAR LENS  IMPLANT, BILATERAL Bilateral    CERVICAL SPINE SURGERY  06/02/2010   lower back and neck   COLONOSCOPY N/A 03/14/2018   Procedure: COLONOSCOPY;  Surgeon: Saintclair Jasper, MD;  Location: [facility];  Service: Gastroenterology;  Laterality: N/A;   COLONOSCOPY WITH PROPOFOL  N/A  12/29/2014   Procedure: COLONOSCOPY WITH PROPOFOL ;  Surgeon: Gladis MARLA Louder, MD;  Location: [facility];  Service: Endoscopy;  Laterality: N/A;   CORONARY ANGIOPLASTY WITH STENT PLACEMENT  October 2012   CORONARY STENT PLACEMENT  Sept 2012   2nd OM with BMS   ESOPHAGOGASTRODUODENOSCOPY N/A 03/14/2018   Procedure: ESOPHAGOGASTRODUODENOSCOPY (EGD);  Surgeon: Saintclair Jasper, MD;  Location: [facility];  Service: Gastroenterology;  Laterality: N/A;   HEMORROIDECTOMY     LAMINECTOMY  05/30/2012   L 4 L5   LAMINECTOMY WITH POSTERIOR LATERAL ARTHRODESIS LEVEL 3 N/A 10/18/2016   Procedure: Posterior Lateral Fusion - Lumbar One-Four, segmental instrumentation Lumbar One-Five,  decompression,;  Surgeon: Joshua Alm RAMAN, MD;  Location: [facility] OR;  Service: Neurosurgery;  Laterality: N/A;   LAPAROSCOPIC CHOLECYSTECTOMY     LAPAROSCOPIC GASTRIC BANDING     LEFT AND RIGHT HEART CATHETERIZATION WITH CORONARY ANGIOGRAM N/A 05/07/2014   Procedure: LEFT AND RIGHT HEART CATHETERIZATION WITH CORONARY ANGIOGRAM;  Surgeon: Jean Skow M Bradly Sangiovanni, MD;  Location: [facility] CATH LAB;  Service: Cardiovascular;  Laterality: N/A;   LEFT ATRIAL APPENDAGE OCCLUSION N/A 06/15/2022   Procedure: LEFT ATRIAL APPENDAGE OCCLUSION;  Surgeon: Cindie Ole DASEN, MD;  Location: [facility] INVASIVE CV LAB;  Service: Cardiovascular;  Laterality: N/A;   LEFT HEART CATH AND CORONARY ANGIOGRAPHY N/A 03/13/2017   Procedure: LEFT HEART CATH AND CORONARY ANGIOGRAPHY;  Surgeon: Christabella Alvira M, MD;  Location: [facility] INVASIVE CV LAB;  Service: Cardiovascular;  Laterality: N/A;   LUMBAR LAMINECTOMY/DECOMPRESSION MICRODISCECTOMY N/A 05/04/2016   Procedure: Laminectomy and Foraminotomy - Thoracic twelve-Lumbar one -Posterior Fusion Lumbar one-two;  Surgeon: Alm RAMAN Joshua, MD;  Location: [facility] OR;  Service: Neurosurgery;  Laterality: N/A;   POLYPECTOMY  03/14/2018   Procedure: POLYPECTOMY;  Surgeon: Saintclair Jasper, MD;  Location: [facility];  Service: Gastroenterology;;   POSTERIOR  LUMBAR FUSION  10/18/2016   SHOULDER OPEN ROTATOR CUFF REPAIR Bilateral    TEE WITHOUT CARDIOVERSION N/A 06/15/2022   Procedure: TRANSESOPHAGEAL ECHOCARDIOGRAM;  Surgeon: Cindie Ole DASEN, MD;  Location: [facility] INVASIVE CV  LAB;  Service: Cardiovascular;  Laterality: N/A;   TONSILLECTOMY AND ADENOIDECTOMY     TOTAL HIP ARTHROPLASTY Right 10/18/2018   Procedure: RIGHT TOTAL HIP ARTHROPLASTY ANTERIOR APPROACH;  Surgeon: Vernetta Lonni GRADE, MD;  Location: [facility] ORS;  Service: Orthopedics;  Laterality: Right;   TRIGGER FINGER RELEASE     LEFT   UVULOPALATOPHARYNGOPLASTY     VASECTOMY      Current Medications: Outpatient Medications Prior to Visit  Medication Sig Dispense Refill   acetaminophen  (TYLENOL ) 500 MG tablet Take 1,000 mg by mouth every 8 (eight) hours as needed for mild pain or headache.      albuterol  (VENTOLIN  HFA) 108 (90 Base) MCG/ACT inhaler Inhale 2 puffs into the lungs every 4 (four) hours as needed for wheezing or shortness of breath. 18 g 3   aspirin  EC 81 MG tablet Take 1 tablet (81 mg total) by mouth daily. Swallow whole. 360 tablet 0   baclofen (LIORESAL) 10 MG tablet Take 10 mg by mouth 3 (three) times daily as needed for muscle spasms.     budesonide -formoterol  (SYMBICORT ) 160-4.5 MCG/ACT inhaler Inhale 2 puffs into the lungs daily as needed (Asthma). 1 each 11   celecoxib  (CELEBREX ) 200 MG capsule Take 200 mg by mouth daily as needed.     dextromethorphan-guaiFENesin (MUCINEX DM) 30-600 MG 12hr tablet Take 2 tablets by mouth as needed for cough (Cold).     diphenoxylate-atropine (LOMOTIL) 2.5-0.025 MG tablet Take 1 tablet by mouth 4 (four) times daily as needed for diarrhea or loose stools.     fluticasone  (FLONASE ) 50 MCG/ACT nasal spray Place 1 spray into both nostrils as needed for allergies or rhinitis.     furosemide  (LASIX ) 40 MG tablet TAKE ONE TABLET (40MG ) BY MOUTH EACH DAY AS NEEDED FOR SWELLING OR WEIGHT GAIN. (Patient taking differently: Take 40 mg by mouth  daily as needed for fluid or edema.) 45 tablet 8   Iron , Ferrous Sulfate, 75 (15 Fe) MG/ML SOLN Take 75 mg by mouth every other day.     JARDIANCE  25 MG TABS tablet Take 25 mg by mouth daily.     methocarbamol  (ROBAXIN ) 500 MG tablet Take 1 tablet (500 mg total) by mouth every 8 (eight) hours as needed for muscle spasms. 60 tablet 1   nitroGLYCERIN  (NITROSTAT ) 0.4 MG SL tablet Place 1 tablet (0.4 mg total) under the tongue every 5 (five) minutes as needed for chest pain. 25 tablet 5   pantoprazole  (PROTONIX ) 40 MG tablet TAKE ONE TABLET BY MOUTH BEFORE BREAKFAST (TAKE ON AN EMPTY STOMACH 30 MINUTES PRIOR TO A MEAL)     Polyethyl Glycol-Propyl Glycol (SYSTANE OP) Place 1 drop into both eyes daily as needed (for dry eyes).      potassium chloride  SA (KLOR-CON  M20) 20 MEQ tablet Taking two tablet by mouth in the am and 1 tablet in the evening 270 tablet 3   pregabalin (LYRICA) 50 MG capsule Take 50 mg by mouth at bedtime.     psyllium (METAMUCIL) 58.6 % packet Take 1 packet by mouth daily as needed (Looose stool).     sildenafil  (VIAGRA ) 100 MG tablet Take 1 tablet (100 mg total) by mouth as needed for erectile dysfunction. 10 tablet 6   Skin Protectants, Misc. (EUCERIN) cream Apply 1 application topically as needed for dry skin.     spironolactone  (ALDACTONE ) 25 MG tablet Take 1 tablet (25 mg total) by mouth daily. 90 tablet 3   tamsulosin  (FLOMAX ) 0.4 MG CAPS Take 0.4 mg  by mouth every evening.      Tiotropium Bromide  Monohydrate (SPIRIVA  RESPIMAT) 2.5 MCG/ACT AERS Inhale 2 puffs into the lungs daily. 1 g 5   topiramate  (TOPAMAX ) 25 MG capsule Take 50 mg by mouth 2 (two) times daily.      losartan  (COZAAR ) 25 MG tablet Take 1 tablet (25 mg total) by mouth daily. 90 tablet 3   metoprolol  tartrate (LOPRESSOR ) 25 MG tablet Take 12.5 mg by mouth 2 (two) times daily.     furosemide  (LASIX ) 80 MG tablet Take 1 tablet (80 mg total) by mouth daily.     No facility-administered medications prior to visit.      Allergies:   Other, Lotensin [benazepril], Tofranil [imipramine], Ace inhibitors, Adhesive [tape], Amoxicillin -pot clavulanate, Codeine, Latex, Metformin , Morphine , Nifedipine, and Quinolones   Social History   Socioeconomic History   Marital status: Married    Spouse name: Not on file   Number of children: 3   Years of education: Not on file   Highest education level: Not on file  Occupational History   Occupation: Retired from [employer] RETIRED  Tobacco Use   Smoking status: Former    Current packs/day: 0.00    Average packs/day: 1.5 packs/day for 36.0 years (54.0 ttl pk-yrs)    Types: Cigarettes    Start date: 2    Quit date: 01/24/1992    Years since quitting: 31.1   Smokeless tobacco: Never  Vaping Use   Vaping status: Never Used  Substance and Sexual Activity   Alcohol  use: No   Drug use: No   Sexual activity: Not Currently  Other Topics Concern   Not on file  Social History Narrative   Not on file   Social Drivers of Health   Financial Resource Strain: Not on file  Food Insecurity: No Food Insecurity (06/15/2022)   Hunger Vital Sign    Worried About Running Out of Food in the Last Year: Never true    Ran Out of Food in the Last Year: Never true  Transportation Needs: No Transportation Needs (06/15/2022)   PRAPARE - Administrator, Civil Service (Medical): No    Lack of Transportation (Non-Medical): No  Physical Activity: Not on file  Stress: Not on file  Social Connections: Unknown (06/05/2021)   Received from [facility]   Social Network    Social Network: Not on file     Family History:  The patient's family history includes Diabetes in his mother; Emphysema (age of onset: 71) in his father; Heart attack in his sister; Heart disease in his mother; Other in his mother.   Review of Systems:   Please see the history of present illness.    All other systems reviewed and are otherwise negative except as noted  above.   Physical Exam:    VS:  BP (!) 88/42 (BP Location: Left Arm, Cuff Size: Large)   Pulse (!) 51   Ht 5' 7 (1.702 m)   Wt 238 lb 6.4 oz (108.1 kg)   SpO2 96%   BMI 37.34 kg/m   I am unable to hear BP in right arm. Left arm as noted.  GENERAL:  Well appearing,  obese WM in NAD HEENT:  PERRL, EOMI, sclera are clear. Oropharynx is clear. NECK:  No jugular venous distention, carotid upstroke brisk and symmetric, no bruits, no thyromegaly or adenopathy LUNGS:  Clear to auscultation bilaterally CHEST:  Unremarkable HEART:  RRR,  PMI not displaced  or sustained,S1 and S2 within normal limits, no S3, no S4: no clicks, no rubs, no murmurs ABD:  Soft, nontender. BS +, no masses or bruits. No hepatomegaly, no splenomegaly EXT:  2 + pulses throughout, 1+ edema right leg.  no cyanosis no clubbing SKIN:  Warm and dry.  No rashes NEURO:  Alert and oriented x 3. Cranial nerves II through XII intact. PSYCH:  Cognitively intact        Wt Readings from Last 3 Encounters:  02/27/23 238 lb 6.4 oz (108.1 kg)  12/11/22 246 lb (111.6 kg)  11/23/22 247 lb 12.8 oz (112.4 kg)      Studies/Labs Reviewed:   EKG:  EKG is not ordered today.    Recent Labs: 10/31/2022: ALT 9; BUN 25; Creatinine, Ser 1.03; Hemoglobin 11.7; Platelets 114; Potassium 4.6; Sodium 140   Lipid Panel    Component Value Date/Time   CHOL 132 10/31/2022 0913   TRIG 42 10/31/2022 0913   HDL 61 10/31/2022 0913   CHOLHDL 2.2 10/31/2022 0913   CHOLHDL 3.0 01/07/2013 0410   VLDL 27 01/07/2013 0410   LDLCALC 61 10/31/2022 0913   Labs dated 07/11/16: cholesterol 114, triglycerides 80, HDL 46, LDL 52. A1c 7.1%.  Dated 01/24/17: cholesterol 110, triglycerides 50, HDL 52, LDL 48. A1c 7.8%.  Dated 07/16/17: cholesterol 106, triglycerides 72, HDL 50, LDL 41. Creatinine 1.36. Hgb 12.8. Other chemistries and TSH normal.  Dated 10/10/18: cholesterol 100, triglycerides 54, HDL 51, LDL 38.  Dated 10/28/19: cholesterol 97,  triglycerides 35, HDL 53, LDL 38. Iron  level 125. A1c 6.7 %. TSH normal.  Dated 11/26/19: creatinine 1.19. glucose 122. Hgb 12.0. plts 125K. Other chemistries normal.  Dated 11/15/20: cholesterol 137, triglycerides 65, HDL 78, LDL 46. A1c 7.3%. CBC, CMET, and TSH normal. Dated 07/15/21: A1c 7.1%. cholesterol 108, triglycerides 85, HDL 55, LDL 36. CMET normal. Dated 11/07/21: A1c 7.3%. cholesterol 147, triglycerides 53, HDL 61, LDL 74. CMET normal.   Additional studies/ records that were reviewed today include:   Myoview  03/06/17: Study Highlights   The left ventricular ejection fraction is normal (55-65%). Nuclear stress EF: 61%. There is a small defect of mild severity present in the basal inferior, mid inferior and apex location. The defect is partially reversible. Overall image quality is poor due to soft tissue attenuation. Cannot rule out a small area of ischemia. There is evidence of transient ischemic dilatation with a TID of 1.27. This is an intermediate risk study. There was no ST segment deviation noted during stress    Echo 03/07/17: Study Conclusions   - Left ventricle: The cavity size was normal. Wall thickness was   increased in a pattern of mild LVH. Systolic function was normal.   The estimated ejection fraction was in the range of 55% to 60%.   Wall motion was normal; there were no regional wall motion   abnormalities. Left ventricular diastolic function parameters   were normal. - Aortic valve: There was very mild stenosis. Valve area (VTI):   1.81 cm^2. Valve area (Vmax): 1.94 cm^2. Valve area (Vmean): 1.85   cm^2. - Right atrium: The atrium was mildly dilated. - Atrial septum: No defect or patent foramen ovale was identified. - Pulmonary arteries: PA peak pressure: 54 mm Hg (S).  Cardiac cath 03/15/17: Conclusion     Previously placed Ost 2nd Mrg to 2nd Mrg stent (unknown type) is widely patent. There is no aortic valve regurgitation.   1. No significant  obstructive CAD 2. Aneurysmal thoracic aorta.  Plan: continue medical management . Consider pulmonary evaluation for symptoms of dyspnea.       Referred for: Dyspnea on exertion   Procedure: This patient underwent staged symptom-limited exercise testing using an individualized bike protocol with expired gas analysis metabolic evaluation during exercise.  Demographics  Age: 18 Ht. (in.) 67.0 Wt. (lb) 272.4 BMI: 42.7      Predicted Peak VO2: 16.0  Gender: Male Ht (cm) 170.2 Wt. (kg) 123.6    Results  Pre-Exercise PFTs   FVC 2.01 (54%)       FEV1 1.21 (43%)         FEV1/FVC 60         MVV 68 (60%)        Exercise Time:    7:00      Watts: 60  RPE: 15  Reason stopped: patient ended test due to dyspnea (9/10)  Additional symptoms: chest tightness (6/10) and lightheaded (5/10)  Resting HR: 55 Peak HR: 107   (49% age predicted max HR)  BP rest: 122/70 BP peak: 170/82  Peak VO2: 11.4 (71% predicted peak VO2)  VE/VCO2 slope:  40  OUES: 1.82  Peak RER: 0.96  Ventilatory Threshold: 8.7 (54% predicted and 76% measured peak VO2)  Peak RR 36  Peak Ventilation:  52.2  VE/MVV:  77%  PETCO2 at peak:  30  O2pulse:  13   (93% predicted O2pulse)   Interpretation  Notes: Patient gave a good effort although he was dyspneic. Pulse-oximetry at rest 97% with a low of 93% at peak exercise. Exercise was performed on a cycle ergometer starting at [facility] and increasing by 10W/min.   ECG:   Resting ECG in sinus bradycardia with 1st degree AV block and incomplete right bundle branch block. HR response blunted. There were frequent PVCs at rest with suppression during exercise with no sustained arrhythmias and no ST-T changes. BP response appropriate.   PFT:  Pre-exercise spirometry demonstrates severe obstruction. MVV below normal range.    CPX:  Exercise Capacity- Exercise testing with gas exchange demonstrates a mildly reduced peak VO2 of 11.4 ml/kg/min (71% of the  age/gender/weight matched sedentary norms). The RER of 0.96 indicates a submaximal effort. When adjusted to the patient's ideal body weight of 162.6 lb (73.7 kg) the peak VO2 is 16.0 ml/kg (ibw)/min (70% of the ibw-adjusted predicted).   Cardiovascular response- The O2pulse (a surrogate for strokevolume) increased with incremental exercise reaching peak of 13 ml/beat (93% predicted). DeltaVO2/Delta WR is 8.4 Indicating no clear evidence of cardiovascular impairment.  Ventilatory response- The VE/VCO2 slope is elevated and indicates Increased dead space ventilation. The oxygen  uptake efficiency slope (OUES) is 1.82. The VO2 at the ventilatory threshold was normal at 54% of the predicted peak VO2 and 76% measured PVO2. At peak exercise, the ventilation reached 54% of the measured MVV and breathing reserve was 16 indicating ventilatory reserve was nearly depleted.  PETCO2 was normal at during exercise.   Conclusion: The interpretation of this test is limited due to submaximal effort during the exercise. Based on available data, exercise testing with gas exchange demonstrates mild functional impairment when compared to matched sedentary norms. Suspect dyspnea is multifactorial, there is evidence of effect of deconditioning and primary limitation due to body habitus.   In addition he had inadequate heart rate response to exercise and a state of hyperventilation at peak exercise, which induced an elevated VE/VCO2 slope. He approached depletion of ventilatory reserve which may be due to his obstructive lung disease.  Test, report and preliminary impression by: Josette Ned, MS, ACSM-RCEP 09/26/2017 3:03 PM  FINALIZED Lonna Coder MD  Pulmonary and Critical Care 10/08/2017, 10:04 AM  Assessment:    1. PAF (paroxysmal atrial fibrillation) (HCC)   2. Coronary artery disease involving native coronary artery of native heart without angina pectoris   3. Chronic diastolic heart failure (HCC)    4. Aneurysm of ascending aorta without rupture (HCC)   5. Essential hypertension         Plan:   In order of problems listed above:  1. Paroxysmal Atrial Fibrillation/ Long-term Anticoagulation - s/p DCCV in August 2018.  - s/p Afib and Aflutter ablation in April 2019. Now off antiarrhythmic therapy. - Apple watch with PACs. Perhaps some AFib but rate OK.  - off anticoagulation now with Watchman device in place.    2. CAD - s/p BMS to 2nd OM in 2012, cardiac cath Feb 2019 showed continued patency with nonobstructive disease. - mild occ. tightness - will continue ASA 81 mg daily now off Xarelto .   3. Chronic diastolic CHF -. Note right heart findings in 2016.  - Now on Jardiance   - continue current lasix  and aldactone  - Echo in October 2024 with normal EF - reinforce need for sodium restriction. - patient's BP is quite low today- will stop losartan  and metoprolol . Reduce lasix  to 40 mg daily. Check BP at home.   4. HTN - BP is very low - see above.   5. HLD -  LDL 61 - intolerant of statins  6. Thoracic aortic aneurysm  - stable at 4.9 cm stable by follow up CT  7. Chronic respiratory failure due to morbid obesity and OSA. Followed by pulmonary. - recent CPX suggests major limitation is deconditioning and body habitus. Improved with weight loss.   8. Pulmonary HTN secondary to #3 and #7.  Cardiac cath in 2016 showed mild pulmonary venous HTN. Last Echo in Feb showed mild pulmonary HTN    Signed, Agastya Meister, MD,FACC 02/27/2023 8:37 AM    [facility] [address] [place], [place] [address] Phone: [phone]

## 2023-02-26 ENCOUNTER — Ambulatory Visit: Payer: Medicare Other | Admitting: Cardiology

## 2023-02-27 ENCOUNTER — Ambulatory Visit: Payer: Medicare Other | Attending: Cardiology | Admitting: Cardiology

## 2023-02-27 ENCOUNTER — Encounter: Payer: Self-pay | Admitting: Cardiology

## 2023-02-27 VITALS — BP 88/42 | HR 51 | Ht 67.0 in | Wt 238.4 lb

## 2023-02-27 DIAGNOSIS — I480 Paroxysmal atrial fibrillation: Secondary | ICD-10-CM

## 2023-02-27 DIAGNOSIS — I7121 Aneurysm of the ascending aorta, without rupture: Secondary | ICD-10-CM | POA: Diagnosis not present

## 2023-02-27 DIAGNOSIS — I2510 Atherosclerotic heart disease of native coronary artery without angina pectoris: Secondary | ICD-10-CM

## 2023-02-27 DIAGNOSIS — I5032 Chronic diastolic (congestive) heart failure: Secondary | ICD-10-CM | POA: Diagnosis not present

## 2023-02-27 DIAGNOSIS — I10 Essential (primary) hypertension: Secondary | ICD-10-CM | POA: Diagnosis not present

## 2023-02-27 MED ORDER — FUROSEMIDE 80 MG PO TABS: 40.0000 mg | ORAL_TABLET | Freq: Every day | ORAL | Status: AC

## 2023-02-27 NOTE — Patient Instructions (Signed)
 Medication Instructions:  Stop Losartan  Stop Metoprolol  Decrease Lasix  to 40 mg daily Continue all other medications *If you need a refill on your cardiac medications before your next appointment, please call your pharmacy*   Lab Work: None ordered   Testing/Procedures: None ordered   Follow-Up: At [facility], you and your health needs are our priority.  As part of our continuing mission to provide you with exceptional heart care, we have created designated Provider Care Teams.  These Care Teams include your primary Cardiologist (physician) and Advanced Practice Providers (APPs -  Physician Assistants and Nurse Practitioners) who all work together to provide you with the care you need, when you need it.  We recommend signing up for the patient portal called MyChart.  Sign up information is provided on this After Visit Summary.  MyChart is used to connect with patients for Virtual Visits (Telemedicine).  Patients are able to view lab/test results, encounter notes, upcoming appointments, etc.  Non-urgent messages can be sent to your provider as well.   To learn more about what you can do with MyChart, go to [email or web address].    Your next appointment:  6 months     Call in June to schedule August appointment     Provider:  Dr.Jordan     Check blood pressure daily Call if blood pressure low  Call if you have more swelling

## 2023-03-01 ENCOUNTER — Other Ambulatory Visit: Payer: Self-pay | Admitting: *Deleted

## 2023-03-01 DIAGNOSIS — J449 Chronic obstructive pulmonary disease, unspecified: Secondary | ICD-10-CM

## 2023-03-02 ENCOUNTER — Ambulatory Visit (INDEPENDENT_AMBULATORY_CARE_PROVIDER_SITE_OTHER): Payer: Medicare Other | Admitting: Adult Health

## 2023-03-02 ENCOUNTER — Encounter: Payer: Self-pay | Admitting: Adult Health

## 2023-03-02 ENCOUNTER — Ambulatory Visit (INDEPENDENT_AMBULATORY_CARE_PROVIDER_SITE_OTHER): Payer: Medicare Other | Admitting: Internal Medicine

## 2023-03-02 VITALS — BP 108/50 | HR 78 | Ht 66.0 in | Wt 236.0 lb

## 2023-03-02 DIAGNOSIS — J449 Chronic obstructive pulmonary disease, unspecified: Secondary | ICD-10-CM

## 2023-03-02 DIAGNOSIS — I5032 Chronic diastolic (congestive) heart failure: Secondary | ICD-10-CM

## 2023-03-02 DIAGNOSIS — G4733 Obstructive sleep apnea (adult) (pediatric): Secondary | ICD-10-CM

## 2023-03-02 LAB — PULMONARY FUNCTION TEST
DL/VA % pred: 74 %
DL/VA: 2.93 ml/min/mmHg/L
DLCO cor % pred: 58 %
DLCO cor: 12.43 ml/min/mmHg
DLCO unc % pred: 58 %
DLCO unc: 12.43 ml/min/mmHg
FEF 25-75 Post: 0.92 L/s
FEF 25-75 Pre: 0.86 L/s
FEF2575-%Change-Post: 7 %
FEF2575-%Pred-Post: 58 %
FEF2575-%Pred-Pre: 54 %
FEV1-%Change-Post: 4 %
FEV1-%Pred-Post: 71 %
FEV1-%Pred-Pre: 68 %
FEV1-Post: 1.66 L
FEV1-Pre: 1.59 L
FEV1FVC-%Change-Post: 5 %
FEV1FVC-%Pred-Pre: 92 %
FEV6-%Change-Post: -2 %
FEV6-%Pred-Post: 75 %
FEV6-%Pred-Pre: 78 %
FEV6-Post: 2.34 L
FEV6-Pre: 2.4 L
FEV6FVC-%Change-Post: -1 %
FEV6FVC-%Pred-Post: 106 %
FEV6FVC-%Pred-Pre: 108 %
FVC-%Change-Post: 0 %
FVC-%Pred-Post: 71 %
FVC-%Pred-Pre: 72 %
FVC-Post: 2.38 L
FVC-Pre: 2.4 L
Post FEV1/FVC ratio: 70 %
Post FEV6/FVC ratio: 98 %
Pre FEV1/FVC ratio: 66 %
Pre FEV6/FVC Ratio: 100 %
RV % pred: 116 %
RV: 2.85 L
TLC % pred: 86 %
TLC: 5.43 L

## 2023-03-02 MED ORDER — IPRATROPIUM BROMIDE 0.03 % NA SOLN: 2.0000 | Freq: Two times a day (BID) | NASAL | 3 refills | Status: DC

## 2023-03-02 NOTE — Patient Instructions (Addendum)
 Try Zyrtec 10mg  At bedtime  Try Flonase  2 puffs daily  Ipratropium nasal As needed  for drippy nose.  Saline nasal rinses As needed   Delsym 2 tsp Twice daily  As needed  Cough/congestion .  Tessalon  Three times a day  For cough As needed   Continue on Symbicort  2 puffs Twice daily  , rinse after use.  Take Spiriva  2 puffs daily . ProAir  As needed for wheezing .    Continue on CPAP At bedtime   Work on healthy weight  Do not drive if sleepy .  Saline nasal gel At bedtime    Follow up with Dr. Neda or Reese Senk NP (30 min slot ) in 6 months Please contact office for sooner follow up if symptoms do not improve or worsen or seek emergency care

## 2023-03-02 NOTE — Progress Notes (Signed)
 Full PFT performed today.

## 2023-03-02 NOTE — Progress Notes (Signed)
 @Patient  ID: Carlos Dawson, male    DOB: 21-Apr-1940, 83 y.o.   MRN: [id number]  Chief Complaint  Patient presents with   Follow-up    Referring provider: Dwight Trula SQUIBB, MD  HPI: 83 year old male former smoker followed for COPD with asthma, obstructive sleep apnea, allergic rhinitis and chronic cough Medical history significant for atrial fibrillation status post ablation, chronic diastolic heart failure, ascending thoracic aortic aneurysm Gets medical care and prescriptions through the [facility] center.  TEST/EVENTS :  PFT 12/20/10 >> FEV1 2.13(79%), FEV1% 71, TLC 5.80(100%), DLCO 57%, no BD Spirometry 05/11/15 >> FEV1 1.68 (55%), FEV1% 69 FeNO 08/13/15 >> 13 Room air SpO2 with exertion 08/13/15 >> 87% - improved with 2 liters oxygen    CT chest 09/14/11 >> Stable dilatation of ascending thoracic aorta 4.7 cm, PA 4.1 cm CT angio chest 10/01/14 >> aortic dilation CT angio chest 09/15/16 >> 5 cm ascending TAA CT angio chest 04/24/19 >> ascending aorta 4.8 x 4.8 cm   PSG 03/30/97 >> AHI 21 PSG 10/21/13 >> AHI 40.1, SaO2 80%, PLMI 75.6 CPAP 12/28/13 >> CPAP 10 cm H2O >> AHI 1.5, +R  ONO with CPAP 08/19/15 >> test time 8 hrs 40 min.  Basal SpO2 92.3%, low SpO2 86%.  Spent 1.3 min with SpO2 < 88%. CPAP 03/15/20 to 04/13/20 >> used on 30 of 30 nights with average 8 hrs 15 min.  Average AHI 6 with CPAP 10 cm H2O   Cardiac Tests:  [facility] 03/13/17 >> patent Ost 2nd Mrg stent Cardiopulmonary exercise test 09/26/17 >> deconditioning Echo 10/17/17 >> EF 60 to 65%, grade 2 DD 2D echo October 26, 2022 showed EF at 60 to 65%, mild LVH, grade 1 diastolic dysfunction, right ventricular systolic function normal, RV size normal, mild aortic valve stenosis, severe dilation of the ascending aorta measuring 52 mm.   03/02/2023 Follow up : COPD with Asthma, OSA and Chronic Rhinitis Patient presents for a 67-month follow-up.  Patient has underlying COPD with asthma.  He complains of shortness of breath with activities  decreased activity tolerance over the last couple years.  Patient says he is limited due to chronic back pain.  Does have balance issues and is not able to walk but short distances.  Walk test in the office in October showed no significant desaturations on room air.  Last visit was started on Spiriva  daily.  He remains on Symbicort  twice daily.  Since last visit says he is doing good overall.  Does admit that he does not take Spiriva  every day. PFTs done today showed moderate COPD with FEV1 71, ratio 70, no BD response, DLCO 58%.  We discussed his PFT results in detail.  They are similar to 2012.  Patient has severe obstructive sleep apnea and on nocturnal CPAP.  Patient says that he does well on CPAP.  Feels that he benefits from CPAP with decreased daytime sleepiness.CPAP download shows excellent compliance with daily average usage at 8 hours.  CPAP 10 cm H2O.  AHI 5.1/hour . Uses adapt DME , uses a full face mask.   Patient is followed by cardiology for diastolic heart failure.  Remains on Lasix  and Aldactone . Losartan  and Metoprolol  stopped due to hypotension by cards.   Allergies  Allergen Reactions   Other     Other reaction(s): cough, Other Other reaction(s): cough  Other reaction(s): wt gain   Lotensin [Benazepril] Cough   Tofranil [Imipramine]     Weight gain   Ace Inhibitors Cough   Adhesive [  Tape] Itching and Rash   Amoxicillin -Pot Clavulanate Other (See Comments)    GI Upset (intolerance)   Codeine Nausea Only   Latex Itching, Rash and Other (See Comments)    Bandaids   Metformin  Diarrhea   Morphine  Itching   Nifedipine Other (See Comments)    Other reaction(s): leg edema   Quinolones Other (See Comments)    Patient was warned about not using Cipro and similar antibiotics. Recent studies have raised concern that fluoroquinolone antibiotics could be associated with an increased risk of aortic aneurysm Fluoroquinolones have non-antimicrobial properties that might jeopardise  the integrity of the extracellular matrix of the vascular wall In a  propensity score matched cohort study in [place], there was a 66% increased rate of aortic aneurysm or dissection associated with oral fluoroquinolone use, compared wit    Immunization History  Administered Date(s) Administered   Fluad Quad(high Dose 65+) 11/26/2018, 10/04/2020   Fluad Trivalent(High Dose 65+) 10/30/2022   Influenza Split 12/08/2010, 10/24/2011, 10/23/2012, 10/23/2013, 11/19/2014, 10/19/2015, 11/04/2016, 10/17/2017, 11/26/2018, 10/04/2020   Influenza Whole 09/23/2009, 10/23/2011   Influenza, High Dose Seasonal PF 10/24/2011, 12/04/2013, 10/19/2015, 11/06/2016, 11/13/2016, 10/27/2019   Influenza,inj,Quad PF,6+ Mos 10/23/2013, 10/24/2014   Influenza-Unspecified 12/08/2010, 10/23/2012, 10/23/2013, 10/24/2014, 10/19/2015, 11/04/2016, 11/26/2018, 10/26/2020   PFIZER Comirnaty(Gray Top)Covid-19 Tri-Sucrose Vaccine 08/22/2022   PFIZER(Purple Top)SARS-COV-2 Vaccination 02/13/2019, 03/06/2019, 12/29/2019   Pneumococcal Conjugate-13 04/24/2011, 10/23/2013, 12/04/2013, 01/23/2014   Pneumococcal Polysaccharide-23 11/22/2010, 05/09/2011, 11/21/2020   Pneumococcal-Unspecified 09/23/1997, 04/24/2011   Tdap 01/25/2008, 05/19/2008   Typhoid Inactivated 11/02/2015   Zoster Recombinant(Shingrix) 04/05/2017, 07/05/2017   Zoster, Live 06/03/2014, 10/24/2014, 07/05/2017    Past Medical History:  Diagnosis Date   Anticoagulant long-term use    Aortic root enlargement (HCC)    Ascending aortic aneurysm (HCC)    recent scan in October 2012 showing no change; followed by Dr. Army   ASCVD (arteriosclerotic cardiovascular disease)    Prior BMS to the 2nd OM in September 2012; with repeat cath in October showing patency   CAD (coronary artery disease)    a. s/p BMS to 2nd OM in Sept 2012; b. LexiScan  Myoview  (12/2012):  Inf infarct; bowel and motion artifact make study difficult to interpret; no ischemia; not gated; Low Risk    CHF (congestive heart failure) (HCC)    no recent issues 10/13/14   Chronic back pain    all over my back (05/11/2017)   Colonic polyp    Contact lens/glasses fitting    Diastolic dysfunction    DVT (deep venous thrombosis) (HCC)    ?LLE   Frequent headaches    probably weekly (05/11/2017)   Generalized headaches    neck stenosis   GERD (gastroesophageal reflux disease)    Hearing loss    Hearing loss    more so on left   Hemorrhoids    History of stomach ulcers    Hypertension    IBS (irritable bowel syndrome)    LVH (left ventricular hypertrophy)    Mild intermittent asthma    OA (osteoarthritis)    all over (05/11/2017)   Obesities, morbid (HCC)    OSA (obstructive sleep apnea)    PSG 03/30/97 AHI 21, BPAP 13/9   OSA on CPAP    PAF (paroxysmal atrial fibrillation) (HCC)    a. on Xarelto  b. s/p DCCV in 08/2016; b. Tikosyn  failed 04/16/17 with plans for Multaq  and possible Afib ablation with Dr. Kelsie   Pneumonia    'several times (05/11/2017)   Presence of Watchman left atrial appendage  closure device 06/15/2022   24mm Watchman FLX Pro placed by Dr. Cindie   Prostate CA [facility])    Oncologist  DR. lennis [facility] dx 09/24/14, undetermined tx   prostate; S/P radiation and hormone injections   Pulmonary embolism (HCC) 2008   both lungs   SOB (shortness of breath)    on excertion   Thoracic aortic aneurysm (HCC)    Aortic Size Index=     5.0    /Body surface area is 2.43 meters squared. = 2.05  < 2.75 cm/m2      4% risk per year 2.75 to 4.25          8% risk per year > 4.25 cm/m2    20% risk per year   Stable aneurysmal dilation of the ascending aorta with maximum AP diameter of 4.8 cm. Stable area of narrowing of the proximal most portion of the descending aorta measuring 2 cm., previously identified as an area of coarctation. No evidence of aortic dissection.  Coronary artery disease.  Normal appearance of the lungs.   Electronically Signed   By: Dobrinka  Dimitrova  M.D.   On: 10/01/2014 08:50     Type II diabetes mellitus (HCC)    metphormin, average 154 dx 2017    Tobacco History: Social History   Tobacco Use  Smoking Status Former   Current packs/day: 0.00   Average packs/day: 1.5 packs/day for 36.0 years (54.0 ttl pk-yrs)   Types: Cigarettes   Start date: 63   Quit date: 01/24/1992   Years since quitting: 31.1  Smokeless Tobacco Never   Counseling given: Not Answered   Outpatient Medications Prior to Visit  Medication Sig Dispense Refill   acetaminophen  (TYLENOL ) 500 MG tablet Take 1,000 mg by mouth every 8 (eight) hours as needed for mild pain or headache.      albuterol  (VENTOLIN  HFA) 108 (90 Base) MCG/ACT inhaler Inhale 2 puffs into the lungs every 4 (four) hours as needed for wheezing or shortness of breath. 18 g 3   aspirin  EC 81 MG tablet Take 1 tablet (81 mg total) by mouth daily. Swallow whole. 360 tablet 0   baclofen (LIORESAL) 10 MG tablet Take 10 mg by mouth 3 (three) times daily as needed for muscle spasms.     budesonide -formoterol  (SYMBICORT ) 160-4.5 MCG/ACT inhaler Inhale 2 puffs into the lungs daily as needed (Asthma). 1 each 11   celecoxib  (CELEBREX ) 200 MG capsule Take 200 mg by mouth daily as needed.     dextromethorphan-guaiFENesin (MUCINEX DM) 30-600 MG 12hr tablet Take 2 tablets by mouth as needed for cough (Cold).     diphenoxylate-atropine (LOMOTIL) 2.5-0.025 MG tablet Take 1 tablet by mouth 4 (four) times daily as needed for diarrhea or loose stools.     fluticasone  (FLONASE ) 50 MCG/ACT nasal spray Place 1 spray into both nostrils as needed for allergies or rhinitis.     furosemide  (LASIX ) 40 MG tablet TAKE ONE TABLET (40MG ) BY MOUTH EACH DAY AS NEEDED FOR SWELLING OR WEIGHT GAIN. (Patient taking differently: Take 40 mg by mouth daily as needed for fluid or edema.) 45 tablet 8   furosemide  (LASIX ) 80 MG tablet Take 0.5 tablets (40 mg total) by mouth daily.     Iron , Ferrous Sulfate, 75 (15 Fe) MG/ML SOLN Take 75 mg  by mouth every other day.     JARDIANCE  25 MG TABS tablet Take 25 mg by mouth daily.     methocarbamol  (ROBAXIN ) 500 MG tablet Take 1 tablet (500  mg total) by mouth every 8 (eight) hours as needed for muscle spasms. 60 tablet 1   nitroGLYCERIN  (NITROSTAT ) 0.4 MG SL tablet Place 1 tablet (0.4 mg total) under the tongue every 5 (five) minutes as needed for chest pain. 25 tablet 5   pantoprazole  (PROTONIX ) 40 MG tablet TAKE ONE TABLET BY MOUTH BEFORE BREAKFAST (TAKE ON AN EMPTY STOMACH 30 MINUTES PRIOR TO A MEAL)     Polyethyl Glycol-Propyl Glycol (SYSTANE OP) Place 1 drop into both eyes daily as needed (for dry eyes).      potassium chloride  SA (KLOR-CON  M20) 20 MEQ tablet Taking two tablet by mouth in the am and 1 tablet in the evening 270 tablet 3   pregabalin (LYRICA) 50 MG capsule Take 50 mg by mouth at bedtime.     psyllium (METAMUCIL) 58.6 % packet Take 1 packet by mouth daily as needed (Looose stool).     sildenafil  (VIAGRA ) 100 MG tablet Take 1 tablet (100 mg total) by mouth as needed for erectile dysfunction. 10 tablet 6   Skin Protectants, Misc. (EUCERIN) cream Apply 1 application topically as needed for dry skin.     spironolactone  (ALDACTONE ) 25 MG tablet Take 1 tablet (25 mg total) by mouth daily. 90 tablet 3   tamsulosin  (FLOMAX ) 0.4 MG CAPS Take 0.4 mg by mouth every evening.      Tiotropium Bromide  Monohydrate (SPIRIVA  RESPIMAT) 2.5 MCG/ACT AERS Inhale 2 puffs into the lungs daily. 1 g 5   topiramate  (TOPAMAX ) 25 MG capsule Take 50 mg by mouth 2 (two) times daily.      No facility-administered medications prior to visit.     Review of Systems:   Constitutional:   No  weight loss, night sweats,  Fevers, chills, +fatigue, or  lassitude.  HEENT:   No headaches,  Difficulty swallowing,  Tooth/dental problems, or  Sore throat,                No sneezing, itching, ear ache, nasal congestion, post nasal drip,   CV:  No chest pain,  Orthopnea, PND, +swelling in lower extremities, no  anasarca, dizziness, palpitations, syncope.   GI  No heartburn, indigestion, abdominal pain, nausea, vomiting, diarrhea, change in bowel habits, loss of appetite, bloody stools.   Resp: .  No chest wall deformity  Skin: no rash or lesions.  GU: no dysuria, change in color of urine, no urgency or frequency.  No flank pain, no hematuria   MS:  No joint pain or swelling.  No decreased range of motion.  +back pain.    Physical Exam  BP (!) 108/50 (BP Location: Left Arm, Patient Position: Sitting, Cuff Size: Large)   Pulse 78   Ht 5' 6 (1.676 m)   Wt 236 lb (107 kg)   SpO2 98%   BMI 38.09 kg/m   GEN: A/Ox3; pleasant , NAD, well nourished    HEENT:  [place]/AT,  EACs-clear, TMs-wnl, NOSE-clear, THROAT-clear, no lesions, no postnasal drip or exudate noted.   NECK:  Supple w/ fair ROM; no JVD; normal carotid impulses w/o bruits; no thyromegaly or nodules palpated; no lymphadenopathy.    RESP  Clear  P & A; w/o, wheezes/ rales/ or rhonchi. no accessory muscle use, no dullness to percussion  CARD:  RRR, Gr1-2 SM , 1+ peripheral edema, pulses intact, no cyanosis or clubbing.  GI:   Soft & nt; nml bowel sounds; no organomegaly or masses detected.   Musco: Warm bil, no deformities or joint swelling noted.  Neuro: alert, no focal deficits noted.    Skin: Warm, no lesions or rashes    Lab Results:    BMET     Imaging:   Administration History     None          Latest Ref Rng & Units 03/02/2023    8:41 AM  PFT Results  FVC-Pre L 2.40  P  FVC-Predicted Pre % 72  P  FVC-Post L 2.38  P  FVC-Predicted Post % 71  P  Pre FEV1/FVC % % 66  P  Post FEV1/FCV % % 70  P  FEV1-Pre L 1.59  P  FEV1-Predicted Pre % 68  P  FEV1-Post L 1.66  P  DLCO uncorrected ml/min/mmHg 12.43  P  DLCO UNC% % 58  P  DLCO corrected ml/min/mmHg 12.43  P  DLCO COR %Predicted % 58  P  DLVA Predicted % 74  P  TLC L 5.43  P  TLC % Predicted % 86  P  RV % Predicted % 116  P    P Preliminary  result    Lab Results  Component Value Date   NITRICOXIDE 13 08/13/2015        Assessment & Plan:   COPD (chronic obstructive pulmonary disease) (HCC) COPD with asthma overlap.  Patient does have shortness of breath with increased activity and decreased activity tolerance-suspect also has a component of physical deconditioning with chronic back pain and inability to exercise.  Seems to be compensated at baseline on Symbicort .  Encouraged to use Spiriva  daily.  PFTs show stable lung function.  Advised on activity as tolerated. Chest x-ray May 2024 showed stable chronic changes.  Plan  Patient Instructions  Try Zyrtec 10mg  At bedtime  Try Flonase  2 puffs daily  Ipratropium nasal As needed  for drippy nose.  Saline nasal rinses As needed   Delsym 2 tsp Twice daily  As needed  Cough/congestion .  Tessalon  Three times a day  For cough As needed   Continue on Symbicort  2 puffs Twice daily  , rinse after use.  Take Spiriva  2 puffs daily . ProAir  As needed for wheezing .    Continue on CPAP At bedtime   Work on healthy weight  Do not drive if sleepy .  Saline nasal gel At bedtime    Follow up with Dr. Neda or Tasheka Houseman NP (30 min slot ) in 6 months Please contact office for sooner follow up if symptoms do not improve or worsen or seek emergency care      OSA (obstructive sleep apnea) Excellent control and compliance on nocturnal CPAP.  No changes.  CPAP care discussed  Plan Patient Instructions  Try Zyrtec 10mg  At bedtime  Try Flonase  2 puffs daily  Ipratropium nasal As needed  for drippy nose.  Saline nasal rinses As needed   Delsym 2 tsp Twice daily  As needed  Cough/congestion .  Tessalon  Three times a day  For cough As needed   Continue on Symbicort  2 puffs Twice daily  , rinse after use.  Take Spiriva  2 puffs daily . ProAir  As needed for wheezing .    Continue on CPAP At bedtime   Work on healthy weight  Do not drive if sleepy .  Saline nasal gel At bedtime     Follow up with Dr. Neda or Journee Bobrowski NP (30 min slot ) in 6 months Please contact office for sooner follow up if symptoms do not improve or worsen or seek  emergency care    Chronic diastolic heart failure (HCC) Appears compensated-at baseline.  Continue follow-up with cardiology.     Madelin Stank, NP 03/02/2023

## 2023-03-02 NOTE — Assessment & Plan Note (Signed)
 COPD with asthma overlap.  Patient does have shortness of breath with increased activity and decreased activity tolerance-suspect also has a component of physical deconditioning with chronic back pain and inability to exercise.  Seems to be compensated at baseline on Symbicort .  Encouraged to use Spiriva  daily.  PFTs show stable lung function.  Advised on activity as tolerated. Chest x-ray May 2024 showed stable chronic changes.  Plan  Patient Instructions  Try Zyrtec 10mg  At bedtime  Try Flonase  2 puffs daily  Ipratropium nasal As needed  for drippy nose.  Saline nasal rinses As needed   Delsym 2 tsp Twice daily  As needed  Cough/congestion .  Tessalon  Three times a day  For cough As needed   Continue on Symbicort  2 puffs Twice daily  , rinse after use.  Take Spiriva  2 puffs daily . ProAir  As needed for wheezing .    Continue on CPAP At bedtime   Work on healthy weight  Do not drive if sleepy .  Saline nasal gel At bedtime    Follow up with Dr. Neda or Riki Berninger NP (30 min slot ) in 6 months Please contact office for sooner follow up if symptoms do not improve or worsen or seek emergency care

## 2023-03-02 NOTE — Assessment & Plan Note (Signed)
 Excellent control and compliance on nocturnal CPAP.  No changes.  CPAP care discussed  Plan Patient Instructions  Try Zyrtec 10mg  At bedtime  Try Flonase  2 puffs daily  Ipratropium nasal As needed  for drippy nose.  Saline nasal rinses As needed   Delsym 2 tsp Twice daily  As needed  Cough/congestion .  Tessalon  Three times a day  For cough As needed   Continue on Symbicort  2 puffs Twice daily  , rinse after use.  Take Spiriva  2 puffs daily . ProAir  As needed for wheezing .    Continue on CPAP At bedtime   Work on healthy weight  Do not drive if sleepy .  Saline nasal gel At bedtime    Follow up with Dr. Neda or Dekari Bures NP (30 min slot ) in 6 months Please contact office for sooner follow up if symptoms do not improve or worsen or seek emergency care

## 2023-03-02 NOTE — Patient Instructions (Signed)
 Full PFT performed today.

## 2023-03-02 NOTE — Assessment & Plan Note (Signed)
 Appears compensated-at baseline.  Continue follow-up with cardiology.

## 2023-03-20 ENCOUNTER — Telehealth: Payer: Self-pay

## 2023-03-20 ENCOUNTER — Telehealth: Payer: Self-pay | Admitting: Cardiology

## 2023-03-20 MED ORDER — INCRUSE ELLIPTA 62.5 MCG/ACT IN AEPB: 1.0000 | INHALATION_SPRAY | Freq: Every day | RESPIRATORY_TRACT | 5 refills | Status: DC

## 2023-03-20 NOTE — Telephone Encounter (Signed)
 Spoke to patient Dr.Jordan advised to resume Losartan 25 mg daily.Advised continue to monitor B/P and call back if elevated.

## 2023-03-20 NOTE — Telephone Encounter (Signed)
 Per Monia Pouch they do not cover Spiriva. The preferred alternative is Incruse.   Please advise, thank you!

## 2023-03-20 NOTE — Telephone Encounter (Signed)
 Pt c/o medication issue:  1. Name of Medication: metoprolol tartrate (LOPRESSOR) 25 MG tablet, losartan (COZAAR) 25 MG tablet   2. How are you currently taking this medication (dosage and times per day)? Take 12.5 mg by mouth 2 (two) times daily,   Take 1 tablet (25 mg total) by mouth daily.   3. Are you having a reaction (difficulty breathing--STAT)? No  4. What is your medication issue? Pt is requesting a callback after noticing since these medications were stopped, his bp is now rising so he has concerns.   Pt c/o BP issue: STAT if pt c/o blurred vision, one-sided weakness or slurred speech  1. What are your last 5 BP readings? 160/100 last night, 150/76 this morning  2. Are you having any other symptoms (ex. Dizziness, headache, blurred vision, passed out)? No  3. What is your BP issue? Pt has concerns about his bp since he was taken off of 2 of his medications. Please advise

## 2023-03-20 NOTE — Telephone Encounter (Signed)
 That is fine let patient know , rx for Incruse sent

## 2023-03-20 NOTE — Telephone Encounter (Signed)
 Patient identification verified by 2 forms. Marilynn Rail, RN    Called and spoke to patient  Patient states:   -BP has been climbing for a few days   -Dr. [name] d/c Metoprolol and Losartan   -Takes Lasix 40mg  and Spironolactone 25 mg   -does not currently have BP log   -After 2/4 appointment BP remained stable in the 120/60s   -BP started to increase around 2/20  -2/25 7:30am BP: 150/76  -2/24 11:00pm BP: 160/100 Patient denies:   -headache   -chest pain/pressure   -SOB/difficulty breathing   -visual changes/disturbances  Informed patient message sent to Dr. [name]  Reviewed ED warning signs/precautions  Patient verbalized understanding, no questions

## 2023-03-21 NOTE — Telephone Encounter (Signed)
 PT ret Sheena's call. I adv about Incruse. He had already picked up Spirva for 300.00 + he said. NFN

## 2023-03-21 NOTE — Telephone Encounter (Signed)
 LVM to return call to advise

## 2023-03-27 DIAGNOSIS — M25559 Pain in unspecified hip: Secondary | ICD-10-CM | POA: Diagnosis not present

## 2023-03-27 DIAGNOSIS — Z87891 Personal history of nicotine dependence: Secondary | ICD-10-CM | POA: Diagnosis not present

## 2023-03-27 DIAGNOSIS — M549 Dorsalgia, unspecified: Secondary | ICD-10-CM | POA: Diagnosis not present

## 2023-03-27 DIAGNOSIS — G8929 Other chronic pain: Secondary | ICD-10-CM | POA: Diagnosis not present

## 2023-03-27 DIAGNOSIS — Z8546 Personal history of malignant neoplasm of prostate: Secondary | ICD-10-CM | POA: Diagnosis not present

## 2023-03-27 DIAGNOSIS — C61 Malignant neoplasm of prostate: Secondary | ICD-10-CM | POA: Diagnosis not present

## 2023-04-10 ENCOUNTER — Other Ambulatory Visit: Payer: Self-pay | Admitting: Student

## 2023-04-10 ENCOUNTER — Ambulatory Visit
Admission: RE | Admit: 2023-04-10 | Discharge: 2023-04-10 | Disposition: A | Discharge status: Home / Self Care | Source: Ambulatory Visit | Attending: Student

## 2023-04-10 DIAGNOSIS — R1084 Generalized abdominal pain: Secondary | ICD-10-CM

## 2023-04-10 DIAGNOSIS — R109 Unspecified abdominal pain: Secondary | ICD-10-CM

## 2023-04-10 DIAGNOSIS — Z860101 Personal history of adenomatous and serrated colon polyps: Secondary | ICD-10-CM | POA: Diagnosis not present

## 2023-04-19 DIAGNOSIS — M1612 Unilateral primary osteoarthritis, left hip: Secondary | ICD-10-CM | POA: Diagnosis not present

## 2023-04-23 ENCOUNTER — Telehealth: Payer: Self-pay | Admitting: *Deleted

## 2023-04-23 DIAGNOSIS — I5030 Unspecified diastolic (congestive) heart failure: Secondary | ICD-10-CM | POA: Diagnosis not present

## 2023-04-23 DIAGNOSIS — I10 Essential (primary) hypertension: Secondary | ICD-10-CM | POA: Diagnosis not present

## 2023-04-23 DIAGNOSIS — E119 Type 2 diabetes mellitus without complications: Secondary | ICD-10-CM | POA: Diagnosis not present

## 2023-04-23 DIAGNOSIS — I2510 Atherosclerotic heart disease of native coronary artery without angina pectoris: Secondary | ICD-10-CM | POA: Diagnosis not present

## 2023-04-23 NOTE — Telephone Encounter (Signed)
   Pre-operative Risk Assessment    Patient Name: Carlos Dawson  DOB: 01/27/1940 MRN: [id number]   Date of last office visit: 02/27/2023 Date of next office visit: N/A   Request for Surgical Clearance    Procedure:   LEFT TOTAL HIP ARTHRO0PLASTY  Date of Surgery:  Clearance 07/16/23                                Surgeon:  DR. Ollen Gross Surgeon's Group or Practice Name:  [facility] Phone number:  [phone] Fax number:  [phone]   Type of Clearance Requested:   - Medical  - Pharmacy:  Hold Aspirin and Jardiance  NOT INDICATED 3  Type of Anesthesia:   CHOICE   Additional requests/questions:    [name]   04/23/2023, 1:44 PM \\[id number]

## 2023-04-25 ENCOUNTER — Telehealth: Payer: Self-pay | Admitting: *Deleted

## 2023-04-25 NOTE — Telephone Encounter (Signed)
   Name: Carlos Dawson  DOB: 06-25-1940  MRN: [id number]  Primary Cardiologist: Peter [name], MD   Preoperative team, please contact this patient and set up a phone call appointment for further preoperative risk assessment. Please obtain consent and complete medication review. Thank you for your help.  I confirm that guidance regarding antiplatelet and oral anticoagulation therapy has been completed and, if necessary, noted below.  Per office protocol, if patient is without any new symptoms or concerns at the time of their virtual visit, he/she may hold aspirin for 5-7 days prior to procedure. Please resume aspirin as soon as possible postprocedure, at the discretion of the surgeon.    Jardiance should be held 3 days prior to procedure.  I also confirmed the patient resides in the state of [place]. As per [facility] Medical Board telemedicine laws, the patient must reside in the state in which the provider is licensed.   Denyce Robert, NP 04/25/2023, 1:39 PM [facility]

## 2023-04-25 NOTE — Telephone Encounter (Signed)
 Pt has been scheduled tele preop appt 06/25/23. Med rec and consent are done.     Patient Consent for Virtual Visit        Carlos Dawson has provided verbal consent on 04/25/2023 for a virtual visit (video or telephone).   CONSENT FOR VIRTUAL VISIT FOR:  Carlos Dawson  By participating in this virtual visit I agree to the following:  I hereby voluntarily request, consent and authorize [facility] and its employed or contracted physicians, physician assistants, nurse practitioners or other licensed health care professionals (the Practitioner), to provide me with telemedicine health care services (the "Services") as deemed necessary by the treating Practitioner. I acknowledge and consent to receive the Services by the Practitioner via telemedicine. I understand that the telemedicine visit will involve communicating with the Practitioner through live audiovisual communication technology and the disclosure of certain medical information by electronic transmission. I acknowledge that I have been given the opportunity to request an in-person assessment or other available alternative prior to the telemedicine visit and am voluntarily participating in the telemedicine visit.  I understand that I have the right to withhold or withdraw my consent to the use of telemedicine in the course of my care at any time, without affecting my right to future care or treatment, and that the Practitioner or I may terminate the telemedicine visit at any time. I understand that I have the right to inspect all information obtained and/or recorded in the course of the telemedicine visit and may receive copies of available information for a reasonable fee.  I understand that some of the potential risks of receiving the Services via telemedicine include:  Delay or interruption in medical evaluation due to technological equipment failure or disruption; Information transmitted may not be sufficient (e.g. poor  resolution of images) to allow for appropriate medical decision making by the Practitioner; and/or  In rare instances, security protocols could fail, causing a breach of personal health information.  Furthermore, I acknowledge that it is my responsibility to provide information about my medical history, conditions and care that is complete and accurate to the best of my ability. I acknowledge that Practitioner's advice, recommendations, and/or decision may be based on factors not within their control, such as incomplete or inaccurate data provided by me or distortions of diagnostic images or specimens that may result from electronic transmissions. I understand that the practice of medicine is not an exact science and that Practitioner makes no warranties or guarantees regarding treatment outcomes. I acknowledge that a copy of this consent can be made available to me via my patient portal [facility] MyChart), or I can request a printed copy by calling the office of [facility].    I understand that my insurance will be billed for this visit.   I have read or had this consent read to me. I understand the contents of this consent, which adequately explains the benefits and risks of the Services being provided via telemedicine.  I have been provided ample opportunity to ask questions regarding this consent and the Services and have had my questions answered to my satisfaction. I give my informed consent for the services to be provided through the use of telemedicine in my medical care

## 2023-04-25 NOTE — Telephone Encounter (Signed)
 Pt has been scheduled tele preop appt 06/25/23. Med rec and consent are done.

## 2023-05-03 ENCOUNTER — Other Ambulatory Visit: Payer: Self-pay | Admitting: Student

## 2023-05-03 ENCOUNTER — Ambulatory Visit
Admission: RE | Admit: 2023-05-03 | Discharge: 2023-05-03 | Disposition: A | Discharge status: Home / Self Care | Source: Ambulatory Visit | Attending: Student | Admitting: Student

## 2023-05-03 DIAGNOSIS — R1084 Generalized abdominal pain: Secondary | ICD-10-CM

## 2023-05-03 DIAGNOSIS — R1310 Dysphagia, unspecified: Secondary | ICD-10-CM | POA: Diagnosis not present

## 2023-05-03 DIAGNOSIS — R109 Unspecified abdominal pain: Secondary | ICD-10-CM | POA: Diagnosis not present

## 2023-05-16 DIAGNOSIS — J449 Chronic obstructive pulmonary disease, unspecified: Secondary | ICD-10-CM | POA: Diagnosis not present

## 2023-05-16 DIAGNOSIS — I10 Essential (primary) hypertension: Secondary | ICD-10-CM | POA: Diagnosis not present

## 2023-05-16 DIAGNOSIS — I480 Paroxysmal atrial fibrillation: Secondary | ICD-10-CM | POA: Diagnosis not present

## 2023-05-16 DIAGNOSIS — E1169 Type 2 diabetes mellitus with other specified complication: Secondary | ICD-10-CM | POA: Diagnosis not present

## 2023-05-16 DIAGNOSIS — M2578 Osteophyte, vertebrae: Secondary | ICD-10-CM | POA: Diagnosis not present

## 2023-05-16 DIAGNOSIS — I5030 Unspecified diastolic (congestive) heart failure: Secondary | ICD-10-CM | POA: Diagnosis not present

## 2023-05-16 DIAGNOSIS — G8929 Other chronic pain: Secondary | ICD-10-CM | POA: Diagnosis not present

## 2023-05-16 DIAGNOSIS — D649 Anemia, unspecified: Secondary | ICD-10-CM | POA: Diagnosis not present

## 2023-05-16 DIAGNOSIS — R490 Dysphonia: Secondary | ICD-10-CM | POA: Diagnosis not present

## 2023-05-17 ENCOUNTER — Other Ambulatory Visit: Payer: Self-pay | Admitting: Orthopedic Surgery

## 2023-05-17 DIAGNOSIS — M5412 Radiculopathy, cervical region: Secondary | ICD-10-CM

## 2023-05-22 ENCOUNTER — Other Ambulatory Visit: Payer: Self-pay | Admitting: Adult Health

## 2023-05-23 NOTE — Patient Instructions (Signed)
 SURGICAL WAITING ROOM VISITATION  Patients having surgery or a procedure may have no more than 2 support people in the waiting area - these visitors may rotate.    Children under the age of 42 must have an adult with them who is not the patient.  Due to an increase in RSV and influenza rates and associated hospitalizations, children ages 97 and under may not visit patients in [facility] hospitals.  Visitors with respiratory illnesses are discouraged from visiting and should remain at home.  If the patient needs to stay at the hospital during part of their recovery, the visitor guidelines for inpatient rooms apply. Pre-op nurse will coordinate an appropriate time for 1 support person to accompany patient in pre-op.  This support person may not rotate.    Please refer to the [facility] website for the visitor guidelines for Inpatients (after your surgery is over and you are in a regular room).       Your procedure is scheduled on:  06/11/2023    Report to [facility] Main Entrance    Report to admitting at  1115AM   Call this number if you have problems the morning of surgery [phone]   Do not eat food :After Midnight.   After Midnight you may have the following liquids until _ 1045_____ AM  DAY OF SURGERY  Water  Non-Citrus Juices (without pulp, NO RED-Apple, White grape, White cranberry) Black Coffee (NO MILK/CREAM OR CREAMERS, sugar ok)  Clear Tea (NO MILK/CREAM OR CREAMERS, sugar ok) regular and decaf                             Plain Jell-O (NO RED)                                           Fruit ices (not with fruit pulp, NO RED)                                     Popsicles (NO RED)                                                               Sports drinks like Gatorade (NO RED)                    The day of surgery:  Drink ONE (1) Pre-Surgery Clear Ensure or G2 at 1045 AM ( have completed by )  the morning of surgery. Drink in one sitting. Do not sip.   This drink was given to you during your hospital  pre-op appointment visit. Nothing else to drink after completing the  Pre-Surgery Clear Ensure or G2.          If you have questions, please contact your surgeon's office.       Oral Hygiene is also important to reduce your risk of infection.  Remember - BRUSH YOUR TEETH THE MORNING OF SURGERY WITH YOUR REGULAR TOOTHPASTE  DENTURES WILL BE REMOVED PRIOR TO SURGERY PLEASE DO NOT APPLY "Poly grip" OR ADHESIVES!!!   Do NOT smoke after Midnight   Stop all vitamins and herbal supplements 7 days before surgery.   Take these medicines the morning of surgery with A SIP OF WATER :  inhalers as usual and bring, protonix , zyrtec, topamax               Jardiance - hold for 72 hours prior to procedure. Last dose on 06/07/2023.   DO NOT TAKE ANY ORAL DIABETIC MEDICATIONS DAY OF YOUR SURGERY  Bring CPAP mask and tubing day of surgery.                              You may not have any metal on your body including hair pins, jewelry, and body piercing             Do not wear make-up, lotions, powders, perfumes/cologne, or deodorant  Do not wear nail polish including gel and S&S, artificial/acrylic nails, or any other type of covering on natural nails including finger and toenails. If you have artificial nails, gel coating, etc. that needs to be removed by a nail salon please have this removed prior to surgery or surgery may need to be canceled/ delayed if the surgeon/ anesthesia feels like they are unable to be safely monitored.   Do not shave  48 hours prior to surgery.               Men may shave face and neck.   Do not bring valuables to the hospital. [facility] IS NOT             RESPONSIBLE   FOR VALUABLES.   Contacts, glasses, dentures or bridgework may not be worn into surgery.   Bring small overnight bag day of surgery.   DO NOT BRING YOUR HOME MEDICATIONS TO THE HOSPITAL. PHARMACY WILL DISPENSE  MEDICATIONS LISTED ON YOUR MEDICATION LIST TO YOU DURING YOUR ADMISSION IN THE HOSPITAL!    Patients discharged on the day of surgery will not be allowed to drive home.  Someone NEEDS to stay with you for the first 24 hours after anesthesia.   Special Instructions: Bring a copy of your healthcare power of attorney and living will documents the day of surgery if you haven't scanned them before.              Please read over the following fact sheets you were given: IF YOU HAVE QUESTIONS ABOUT YOUR PRE-OP INSTRUCTIONS PLEASE CALL [phone]   If you received a COVID test during your pre-op visit  it is requested that you wear a mask when out in public, stay away from anyone that may not be feeling well and notify your surgeon if you develop symptoms. If you test positive for Covid or have been in contact with anyone that has tested positive in the last 10 days please notify you surgeon.      Pre-operative 5 CHG Bath Instructions   You can play a key role in reducing the risk of infection after surgery. Your skin needs to be as free of germs as possible. You can reduce the number of germs on your skin by washing with CHG (chlorhexidine  gluconate) soap before surgery. CHG is an antiseptic soap that kills germs and continues to kill germs even after washing.  DO NOT use if you have an allergy to chlorhexidine /CHG or antibacterial soaps. If your skin becomes reddened or irritated, stop using the CHG and notify one of our RNs at [phone].   Please shower with the CHG soap starting 4 days before surgery using the following schedule:     Please keep in mind the following:  DO NOT shave, including legs and underarms, starting the day of your first shower.   You may shave your face at any point before/day of surgery.  Place clean sheets on your bed the day you start using CHG soap. Use a clean washcloth (not used since being washed) for each shower. DO NOT sleep with pets once you start using  the CHG.   CHG Shower Instructions:  If you choose to wash your hair and private area, wash first with your normal shampoo/soap.  After you use shampoo/soap, rinse your hair and body thoroughly to remove shampoo/soap residue.  Turn the water  OFF and apply about 3 tablespoons (45 ml) of CHG soap to a CLEAN washcloth.  Apply CHG soap ONLY FROM YOUR NECK DOWN TO YOUR TOES (washing for 3-5 minutes)  DO NOT use CHG soap on face, private areas, open wounds, or sores.  Pay special attention to the area where your surgery is being performed.  If you are having back surgery, having someone wash your back for you may be helpful. Wait 2 minutes after CHG soap is applied, then you may rinse off the CHG soap.  Pat dry with a clean towel  Put on clean clothes/pajamas   If you choose to wear lotion, please use ONLY the CHG-compatible lotions on the back of this paper.     Additional instructions for the day of surgery: DO NOT APPLY any lotions, deodorants, cologne, or perfumes.   Put on clean/comfortable clothes.  Brush your teeth.  Ask your nurse before applying any prescription medications to the skin.      CHG Compatible Lotions   Aveeno Moisturizing lotion  Cetaphil Moisturizing Cream  Cetaphil Moisturizing Lotion  Clairol Herbal Essence Moisturizing Lotion, Dry Skin  Clairol Herbal Essence Moisturizing Lotion, Extra Dry Skin  Clairol Herbal Essence Moisturizing Lotion, Normal Skin  Curel Age Defying Therapeutic Moisturizing Lotion with Alpha Hydroxy  Curel Extreme Care Body Lotion  Curel Soothing Hands Moisturizing Hand Lotion  Curel Therapeutic Moisturizing Cream, Fragrance-Free  Curel Therapeutic Moisturizing Lotion, Fragrance-Free  Curel Therapeutic Moisturizing Lotion, Original Formula  Eucerin Daily Replenishing Lotion  Eucerin Dry Skin Therapy Plus Alpha Hydroxy Crme  Eucerin Dry Skin Therapy Plus Alpha Hydroxy Lotion  Eucerin Original Crme  Eucerin Original Lotion  Eucerin  Plus Crme Eucerin Plus Lotion  Eucerin TriLipid Replenishing Lotion  Keri Anti-Bacterial Hand Lotion  Keri Deep Conditioning Original Lotion Dry Skin Formula Softly Scented  Keri Deep Conditioning Original Lotion, Fragrance Free Sensitive Skin Formula  Keri Lotion Fast Absorbing Fragrance Free Sensitive Skin Formula  Keri Lotion Fast Absorbing Softly Scented Dry Skin Formula  Keri Original Lotion  Keri Skin Renewal Lotion Keri Silky Smooth Lotion  Keri Silky Smooth Sensitive Skin Lotion  Nivea Body Creamy Conditioning Oil  Nivea Body Extra Enriched Teacher, adult education Moisturizing Lotion Nivea Crme  Nivea Skin Firming Lotion  NutraDerm 30 Skin Lotion  NutraDerm Skin Lotion  NutraDerm Therapeutic Skin Cream  NutraDerm Therapeutic Skin Lotion  ProShield Protective Hand Cream  Provon moisturizing lotion

## 2023-05-23 NOTE — Progress Notes (Addendum)
 Anesthesia Review:  PCP: Delrae Field clearance 04/26/23  Cardiologist : Peter [name] LOV 02/27/23    Clearance appt is now on 06/05/23.  Pulm- Tammy Parrott,NP LOV 03/02/23    PT does not have any cardiac clearance noted.  Called and spoke with Avanell Bob at [facility] and requested clearance.    PPM/ ICD: Device Orders: Rep Notified: PT has a Watchman   Chest x-ray : 06/19/22- 2 view  EKG : 11/21/22  Echo : 10/26/22  Stress test: 11/21/22  Cardiac Cath :  Ct Card- 08/17/22  PFT- 03/02/23   Activity level: cannot do a flight of stairs due to bAck per pt  Sleep Study/ CPAP : has cpap  Fasting Blood Sugar :      / Checks Blood Sugar -- times a day:     DM- 2- does not check glucose at home  [id number]-  05/29/23- 5.7 Jardiance- Hold for 72 hours prior to procedure last dose on 06/07/23   Blood Thinner/ Instructions /Last Dose: ASA / Instructions/ Last Dose :    81 mg aspirin     Hearing Aids PT states if a cath is needed please use external cath due to hx of radiation cystitis

## 2023-05-25 ENCOUNTER — Ambulatory Visit
Admission: RE | Admit: 2023-05-25 | Discharge: 2023-05-25 | Disposition: A | Discharge status: Home / Self Care | Source: Ambulatory Visit | Attending: Orthopedic Surgery | Admitting: Orthopedic Surgery

## 2023-05-25 DIAGNOSIS — M5412 Radiculopathy, cervical region: Secondary | ICD-10-CM

## 2023-05-25 DIAGNOSIS — M4802 Spinal stenosis, cervical region: Secondary | ICD-10-CM | POA: Diagnosis not present

## 2023-05-29 ENCOUNTER — Encounter (HOSPITAL_COMMUNITY): Payer: Self-pay

## 2023-05-29 ENCOUNTER — Other Ambulatory Visit: Payer: Self-pay

## 2023-05-29 ENCOUNTER — Encounter (HOSPITAL_COMMUNITY)
Admission: RE | Admit: 2023-05-29 | Discharge: 2023-05-29 | Disposition: A | Discharge status: Home / Self Care | Source: Ambulatory Visit | Attending: Orthopedic Surgery | Admitting: Orthopedic Surgery

## 2023-05-29 ENCOUNTER — Telehealth: Payer: Self-pay | Admitting: Cardiology

## 2023-05-29 VITALS — BP 114/60 | HR 57 | Temp 97.7°F | Resp 16 | Ht 64.75 in | Wt 235.0 lb

## 2023-05-29 DIAGNOSIS — Z01818 Encounter for other preprocedural examination: Secondary | ICD-10-CM | POA: Insufficient documentation

## 2023-05-29 DIAGNOSIS — E139 Other specified diabetes mellitus without complications: Secondary | ICD-10-CM | POA: Insufficient documentation

## 2023-05-29 HISTORY — DX: Chronic obstructive pulmonary disease, unspecified: J44.9

## 2023-05-29 LAB — BASIC METABOLIC PANEL WITH GFR
Anion gap: 4 — ABNORMAL LOW (ref 5–15)
BUN: 24 mg/dL — ABNORMAL HIGH (ref 8–23)
CO2: 25 mmol/L (ref 22–32)
Calcium: 8.6 mg/dL — ABNORMAL LOW (ref 8.9–10.3)
Chloride: 109 mmol/L (ref 98–111)
Creatinine, Ser: 0.98 mg/dL (ref 0.61–1.24)
GFR, Estimated: >60 mL/min (ref >60)
Glucose, Bld: 130 mg/dL — ABNORMAL HIGH (ref 70–99)
Potassium: 4 mmol/L (ref 3.5–5.1)
Sodium: 138 mmol/L (ref 135–145)

## 2023-05-29 LAB — CBC
HCT: 30.3 % — ABNORMAL LOW (ref 39.0–52.0)
Hemoglobin: 11.2 g/dL — ABNORMAL LOW (ref 13.0–17.0)
MCH: 42.6 pg — ABNORMAL HIGH (ref 26.0–34.0)
MCHC: 37 g/dL — ABNORMAL HIGH (ref 30.0–36.0)
MCV: 115.2 fL — ABNORMAL HIGH (ref 80.0–100.0)
Platelets: 141 10*3/uL — ABNORMAL LOW (ref 150–400)
RBC: 2.63 MIL/uL — ABNORMAL LOW (ref 4.22–5.81)
RDW: 14.2 % (ref 11.5–15.5)
WBC: 4.9 10*3/uL (ref 4.0–10.5)
nRBC: 0 % (ref 0.0–0.2)

## 2023-05-29 LAB — TYPE AND SCREEN
ABO/RH(D): O POS
Antibody Screen: NEGATIVE

## 2023-05-29 LAB — HEMOGLOBIN A1C
Hgb A1c MFr Bld: 5.7 % — ABNORMAL HIGH (ref 4.8–5.6)
Mean Plasma Glucose: 116.89 mg/dL

## 2023-05-29 LAB — GLUCOSE, CAPILLARY: Glucose-Capillary: 122 mg/dL — ABNORMAL HIGH (ref 70–99)

## 2023-05-29 LAB — SURGICAL PCR SCREEN
MRSA, PCR: NEGATIVE
Staphylococcus aureus: NEGATIVE

## 2023-05-29 NOTE — Telephone Encounter (Signed)
 REQUESTING OFFICE SENT AN UPDATED FAX WITH THE NEW DATE FOR SURGERY 06/11/23;   NEED MED RECOMMENDATIONS FOR ASA AND JARDIANCE

## 2023-05-29 NOTE — Telephone Encounter (Signed)
 I s/w the pt and we moved his tele appt up sooner as he called us  and gave an update that the surgeon moved up his surgery date to 06/08/23 from 07/16/23.   New tele appt is 06/05/23.

## 2023-05-29 NOTE — H&P (Signed)
 Carlos Dawson  Patient is admitted for left Carlos hip arthroplasty.  Subjective:  Chief Complaint: Left hip pain  HPI: Carlos Dawson, 83 y.o. male, has a history of pain and functional disability in the left hip due to arthritis and patient has failed non-surgical conservative treatments for greater than 12 weeks to include NSAID's and/or analgesics, use of assistive devices, and activity modification. Onset of symptoms was gradual, starting  several  years ago with gradually worsening course since that time. The patient noted no past surgery on the left hip. Patient currently rates pain in the left hip at 8 out of 10 with activity. Patient has night pain, worsening of pain with activity and weight bearing, and trendelenberg gait. Patient has evidence of  severe bone-on-bone arthritis in the left hip with massive osteophyte formation  by imaging studies. This condition presents safety issues increasing the risk of falls. There is no current active infection.  Patient Active Problem List   Diagnosis Date Noted   COPD (chronic obstructive pulmonary disease) (HCC) 10/31/2022   Lung nodule 10/31/2022   Atrial fibrillation (HCC) 06/15/2022   Presence of Watchman left atrial appendage closure device 06/15/2022   Chest pain of uncertain etiology 06/15/2022   Diverticulitis of large intestine without perforation or abscess without bleeding 09/08/2021   Unspecified asthma, uncomplicated 09/08/2021   Carpal tunnel syndrome of right wrist 05/20/2021   Pain in right hand 04/04/2021   Arthritis of hand 04/04/2021   Anticoagulated by anticoagulation treatment 08/16/2020   Abnormal feces 05/27/2020   Hypercoagulable state due to persistent atrial fibrillation (HCC) 05/27/2020   Air embolism following infusion, transfusion and therapeutic injection, initial encounter 05/27/2020   Anxiety state 05/27/2020   Benign prostatic hyperplasia without lower urinary tract symptoms 05/27/2020   Cardiac  arrhythmia 05/27/2020   Chronic kidney disease, stage 3 unspecified (HCC) 05/27/2020   Cutaneous abscess of limb, unspecified 05/27/2020   Deltoid tendinitis 05/27/2020   Diarrhea due to drug 05/27/2020   Enlarged prostate 05/27/2020   Functional diarrhea 05/27/2020   History of malignant neoplasm of prostate 05/27/2020   Impaired fasting glucose 05/27/2020   Insomnia 05/27/2020   Iron  deficiency anemia 05/27/2020   Left lower quadrant pain 05/27/2020   Anemia 05/27/2020   Microcytic anemia 05/27/2020   Myalgia 05/27/2020   Nasal congestion 05/27/2020   Other ill-defined and unknown causes of morbidity and mortality 05/27/2020   Pain in joint, ankle and foot 05/27/2020   History of colonic polyps 05/27/2020   Vitamin D  deficiency 05/27/2020   Sacroiliac joint dysfunction 06/17/2019   Status post Carlos replacement of right hip 10/18/2018   Unilateral primary osteoarthritis, right hip 03/26/2018   Hemoptysis 02/08/2018   Atrial fibrillation with rapid ventricular response (HCC) 04/15/2017   SOB (shortness of breath)    Sleep apnea    Prostate CA (HCC)    Pneumonia    PAF (paroxysmal atrial fibrillation) (HCC)    Mild intermittent asthma    IBS (irritable bowel syndrome)    Hearing loss    GERD (gastroesophageal reflux disease)    Generalized headaches    Diastolic dysfunction    Contact lens/glasses fitting    Diabetes mellitus without complication (HCC)    Colonic polyp    Heart failure (HCC)    ASCVD (arteriosclerotic cardiovascular disease)    Ascending aortic aneurysm (HCC)    Aortic root enlargement (HCC)    S/P lumbar spinal fusion 10/18/2016   Polyneuropathy 09/18/2016   Chest pain 09/15/2016  Paroxysmal atrial fibrillation with RVR (HCC) 09/15/2016   Hypotension 09/15/2016   S/P lumbar laminectomy 05/04/2016   Oral candidiasis 03/30/2016   Pain in right hip 03/29/2016   Lumbar radiculopathy 03/29/2016   Post laminectomy syndrome 03/29/2016   Carpal tunnel  syndrome 07/29/2015   Severe obesity (BMI >= 40) (HCC) 04/26/2015   Asthma with acute exacerbation 04/22/2015   Hx of pulmonary embolus 09/11/2014   CAP (community acquired pneumonia) 08/12/2013   Encounter for therapeutic drug monitoring 03/03/2013   Chest pain at rest 01/06/2013   Chronic diastolic heart failure (HCC) 12/26/2012   HLD (hyperlipidemia) 12/26/2012   Allergic rhinitis 12/24/2012   Urge incontinence of urine 10/07/2012   Long term (current) use of anticoagulants 08/29/2012   Lapband APL May 2009 10/25/2011   Hypoxemia 12/08/2010   Dyspnea 12/08/2010   ED (erectile dysfunction) of organic origin 11/28/2010   Elevated prostate specific antigen (PSA) 11/28/2010   Hypertension    Obesities, morbid (HCC)    Pulmonary embolism (HCC)    Anticoagulant long-term use    CAD (coronary artery disease)    Thoracic aortic aneurysm (HCC)    OA (osteoarthritis)    LVH (left ventricular hypertrophy)    Paroxysmal atrial fibrillation (HCC)    Mild persistent asthma, well controlled 10/21/2008   OSA (obstructive sleep apnea) 08/06/2008    Past Medical History:  Diagnosis Date   Anticoagulant long-term use    Aortic root enlargement (HCC)    Ascending aortic aneurysm (HCC)    recent scan in October 2012 showing no change; followed by Dr. Nicanor Barge   ASCVD (arteriosclerotic cardiovascular disease)    Prior BMS to the 2nd OM in September 2012; with repeat cath in October showing patency   CAD (coronary artery disease)    a. s/p BMS to 2nd OM in Sept 2012; b. LexiScan  Myoview  (12/2012):  Inf infarct; bowel and motion artifact make study difficult to interpret; no ischemia; not gated; Low Risk   CHF (congestive heart failure) (HCC)    no recent issues 10/13/14   Chronic back pain    "all over my back" (05/11/2017)   Colonic polyp    Contact lens/glasses fitting    COPD (chronic obstructive pulmonary disease) (HCC)    Diastolic dysfunction    DVT (deep venous thrombosis) (HCC)     ?LLE   GERD (gastroesophageal reflux disease)    Hearing loss    Hearing loss    more so on left   Hemorrhoids    History of stomach ulcers    Hypertension    IBS (irritable bowel syndrome)    LVH (left ventricular hypertrophy)    OA (osteoarthritis)    "all over" (05/11/2017)   Obesities, morbid (HCC)    OSA (obstructive sleep apnea)    PSG 03/30/97 AHI 21, BPAP 13/9   OSA on CPAP    PAF (paroxysmal atrial fibrillation) (HCC)    a. on Xarelto  b. s/p DCCV in 08/2016; b. Tikosyn  failed 04/16/17 with plans for Multaq  and possible Afib ablation with Dr. Nunzio Belch   Pneumonia    'several times" (05/11/2017)   Presence of Watchman left atrial appendage closure device 06/15/2022   24mm Watchman FLX Pro placed by Dr. Marven Slimmer   Prostate CA [facility])    Oncologist  DR. Cristal [facility] 09/24/14, undetermined tx   prostate; S/P "radiation and hormone injections"   Pulmonary embolism (HCC) 2008   "both lungs"   SOB (shortness of breath)    on excertion  Thoracic aortic aneurysm (HCC)    Aortic Size Index=     5.0    /Body surface area is 2.43 meters squared. = 2.05  < 2.75 cm/m2      4% risk per year 2.75 to 4.25          8% risk per year > 4.25 cm/m2    20% risk per year   Stable aneurysmal dilation of the ascending aorta with maximum AP diameter of 4.8 cm. Stable area of narrowing of the proximal most portion of the descending aorta measuring 2 cm., previously identified as an area of coarctation. No evidence of aortic dissection.  Coronary artery disease.  Normal appearance of the lungs.   Electronically Signed   By: Dobrinka  Dimitrova M.D.   On: 10/01/2014 08:50     Type II diabetes mellitus (HCC)    metphormin, average 154 dx 2017    Past Surgical History:  Procedure Laterality Date   ACHILLES TENDON REPAIR Bilateral    AORTIC ARCH ANGIOGRAPHY N/A 03/13/2017   Procedure: AORTIC ARCH ANGIOGRAPHY;  Surgeon: [name], [name], MD;  Location: [facility] INVASIVE CV LAB;  Service: Cardiovascular;   Laterality: N/A;   APPENDECTOMY     ATRIAL FIBRILLATION ABLATION  05/11/2017   ATRIAL FIBRILLATION ABLATION N/A 05/11/2017   Procedure: ATRIAL FIBRILLATION ABLATION;  Surgeon: Jolly Needle, MD;  Location: [facility] INVASIVE CV LAB;  Service: Cardiovascular;  Laterality: N/A;   BACK SURGERY     "I've had 7 back and 1 neck ORs" (05/11/2017)   BIOPSY  03/14/2018   Procedure: BIOPSY;  Surgeon: Genell Ken, MD;  Location: [facility] ENDOSCOPY;  Service: Gastroenterology;;  EGD and Colon   CARDIAC CATHETERIZATION  2006   CARPAL TUNNEL RELEASE Bilateral    LEFT   CATARACT EXTRACTION W/ INTRAOCULAR LENS  IMPLANT, BILATERAL Bilateral    CERVICAL SPINE SURGERY  06/02/2010   lower back and neck   COLONOSCOPY N/A 03/14/2018   Procedure: COLONOSCOPY;  Surgeon: Genell Ken, MD;  Location: [facility] ENDOSCOPY;  Service: Gastroenterology;  Laterality: N/A;   COLONOSCOPY WITH PROPOFOL  N/A 12/29/2014   Procedure: COLONOSCOPY WITH PROPOFOL ;  Surgeon: Garrett Kallman, MD;  Location: [facility] ENDOSCOPY;  Service: Endoscopy;  Laterality: N/A;   CORONARY ANGIOPLASTY WITH STENT PLACEMENT  October 2012   CORONARY STENT PLACEMENT  Sept 2012   2nd OM with BMS   ESOPHAGOGASTRODUODENOSCOPY N/A 03/14/2018   Procedure: ESOPHAGOGASTRODUODENOSCOPY (EGD);  Surgeon: Genell Ken, MD;  Location: [facility];  Service: Gastroenterology;  Laterality: N/A;   HEMORROIDECTOMY     LAMINECTOMY  05/30/2012   L 4 L5   LAMINECTOMY WITH POSTERIOR LATERAL ARTHRODESIS LEVEL 3 N/A 10/18/2016   Procedure: Posterior Lateral Fusion - Lumbar One-Four, segmental instrumentation Lumbar One-Five,  decompression,;  Surgeon: Isadora Mar, MD;  Location: [facility] OR;  Service: Neurosurgery;  Laterality: N/A;   LAPAROSCOPIC CHOLECYSTECTOMY     LAPAROSCOPIC GASTRIC BANDING     LEFT AND RIGHT HEART CATHETERIZATION WITH CORONARY ANGIOGRAM N/A 05/07/2014   Procedure: LEFT AND RIGHT HEART CATHETERIZATION WITH CORONARY ANGIOGRAM;  Surgeon: [name] [name], MD;  Location: [facility] CATH LAB;   Service: Cardiovascular;  Laterality: N/A;   LEFT ATRIAL APPENDAGE OCCLUSION N/A 06/15/2022   Procedure: LEFT ATRIAL APPENDAGE OCCLUSION;  Surgeon: Boyce Byes, MD;  Location: [facility] INVASIVE CV LAB;  Service: Cardiovascular;  Laterality: N/A;   LEFT HEART CATH AND CORONARY ANGIOGRAPHY N/A 03/13/2017   Procedure: LEFT HEART CATH AND CORONARY ANGIOGRAPHY;  Surgeon: [name], [name], MD;  Location:  [facility] INVASIVE CV LAB;  Service: Cardiovascular;  Laterality: N/A;   LUMBAR LAMINECTOMY/DECOMPRESSION MICRODISCECTOMY N/A 05/04/2016   Procedure: Laminectomy and Foraminotomy - Thoracic twelve-Lumbar one -Posterior Fusion Lumbar one-two;  Surgeon: Isadora Mar, MD;  Location: [facility] OR;  Service: Neurosurgery;  Laterality: N/A;   POLYPECTOMY  03/14/2018   Procedure: POLYPECTOMY;  Surgeon: Genell Ken, MD;  Location: [facility] ENDOSCOPY;  Service: Gastroenterology;;   POSTERIOR LUMBAR FUSION  10/18/2016   SHOULDER OPEN ROTATOR CUFF REPAIR Bilateral    TEE WITHOUT CARDIOVERSION N/A 06/15/2022   Procedure: TRANSESOPHAGEAL ECHOCARDIOGRAM;  Surgeon: Boyce Byes, MD;  Location: [facility] INVASIVE CV LAB;  Service: Cardiovascular;  Laterality: N/A;   TONSILLECTOMY AND ADENOIDECTOMY     Carlos HIP ARTHROPLASTY Right 10/18/2018   Procedure: RIGHT Carlos HIP ARTHROPLASTY ANTERIOR APPROACH;  Surgeon: Arnie Lao, MD;  Location: [facility] ORS;  Service: Orthopedics;  Laterality: Right;   TRIGGER FINGER RELEASE     LEFT   UVULOPALATOPHARYNGOPLASTY     VASECTOMY      Prior to Admission medications   Medication Sig Start Date End Date Taking? Authorizing Provider  acetaminophen  (TYLENOL ) 500 MG tablet Take 1,000 mg by mouth every 8 (eight) hours as needed for mild pain or headache.    Yes [provider]  albuterol  (VENTOLIN  HFA) 108 (90 Base) MCG/ACT inhaler Inhale 2 puffs into the lungs every 4 (four) hours as needed for wheezing or shortness of breath. 10/30/22  Yes Parrett, Tammy S, NP  aspirin  EC 81 MG tablet Take  1 tablet (81 mg Carlos) by mouth daily. Swallow whole. 06/15/22 06/15/23 Yes McDaniel, Jill D, NP  budesonide -formoterol  (SYMBICORT ) 160-4.5 MCG/ACT inhaler Inhale 2 puffs into the lungs daily as needed (Asthma). Patient taking differently: Inhale 2 puffs into the lungs 2 (two) times daily as needed (Asthma). 10/30/22  Yes Parrett, Tammy S, NP  celecoxib  (CELEBREX ) 200 MG capsule Take 200 mg by mouth daily as needed for moderate pain (pain score 4-6). 02/23/23  Yes [provider]  cetirizine (ZYRTEC) 10 MG tablet Take 10 mg by mouth daily.   Yes [provider]  Cholecalciferol  (VITAMIN D3 MAXIMUM STRENGTH) 125 MCG (5000 UT) capsule Take 5,000 Units by mouth daily.   Yes [provider]  Ferrous Sulfate Dried (FEOSOL) 200 (65 Fe) MG TABS Take 1 tablet by mouth every other day.   Yes [provider]  furosemide  (LASIX ) 40 MG tablet TAKE ONE TABLET (40MG ) BY MOUTH EACH DAY AS NEEDED FOR SWELLING OR WEIGHT GAIN. Patient taking differently: Take 40 mg by mouth daily. May take a second 40 mg dose as needed for swelling 03/20/22  Yes [name], [name], MD  JARDIANCE 25 MG TABS tablet Take 25 mg by mouth daily. 04/18/18  Yes [provider]  losartan  (COZAAR ) 25 MG tablet Take 1 tablet (25 mg Carlos) by mouth daily. 03/20/23  Yes [name], [name], MD  nitroGLYCERIN  (NITROSTAT ) 0.4 MG SL tablet Place 1 tablet (0.4 mg Carlos) under the tongue every 5 (five) minutes as needed for chest pain. 09/08/21  Yes [name], [name], MD  pantoprazole  (PROTONIX ) 40 MG tablet TAKE ONE TABLET BY MOUTH BEFORE BREAKFAST (TAKE ON AN EMPTY STOMACH 30 MINUTES PRIOR TO A MEAL) 03/09/21  Yes [provider]  Polyethyl Glycol-Propyl Glycol (SYSTANE OP) Place 1 drop into both eyes daily as needed (for dry eyes).    Yes [provider]  potassium chloride  SA (KLOR-CON  M20) 20 MEQ tablet Taking two tablet by mouth in the  am and 1 tablet in the evening 09/22/22  Yes [name], [name], MD   pregabalin (LYRICA) 50 MG capsule Take 50 mg by mouth at bedtime as needed (pain). 02/23/23  Yes [provider]  psyllium (METAMUCIL) 58.6 % packet Take 1 packet by mouth daily as needed (Looose stool).   Yes [provider]  sildenafil  (VIAGRA ) 100 MG tablet Take 1 tablet (100 mg Carlos) by mouth as needed for erectile dysfunction. 07/13/20  Yes [name], [name], MD  Skin Protectants, Misc. (EUCERIN) cream Apply 1 application topically as needed for dry skin.   Yes [provider]  tamsulosin  (FLOMAX ) 0.4 MG CAPS Take 0.4 mg by mouth every evening.    Yes [provider]  Tiotropium Bromide Monohydrate  (SPIRIVA  RESPIMAT) 2.5 MCG/ACT AERS Inhale 2 puffs into the lungs daily as needed (shortness of breath).   Yes [provider]  topiramate  (TOPAMAX ) 25 MG capsule Take 50 mg by mouth 2 (two) times daily.    Yes [provider]  furosemide  (LASIX ) 80 MG tablet Take 0.5 tablets (40 mg Carlos) by mouth daily. Patient not taking: Reported on 05/22/2023 02/27/23 05/28/23  [name], [name], MD  ipratropium (ATROVENT ) 0.03 % nasal spray Place 2 sprays into both nostrils every 12 (twelve) hours. Patient not taking: Reported on 05/22/2023 03/02/23   Parrett, Macdonald Savoy, NP  spironolactone  (ALDACTONE ) 25 MG tablet TAKE 1 TABLET (25 MG Carlos) BY MOUTH DAILY. 05/24/23   [name], [name], MD  umeclidinium bromide  (INCRUSE ELLIPTA ) 62.5 MCG/ACT AEPB Inhale 1 puff into the lungs daily. 03/20/23   Parrett, Macdonald Savoy, NP    Allergies  Allergen Reactions   Lotensin [Benazepril] Cough   Tofranil [Imipramine]     Weight gain   Ace Inhibitors Cough   Adhesive [Tape] Itching and Rash   Amoxicillin -Pot Clavulanate Other (See Comments)    GI Upset (intolerance)   Codeine Nausea Only   Latex Itching, Rash and Other (See Comments)    Bandaids   Metformin  Diarrhea   Morphine Itching   Nifedipine Swelling     leg edema   Quinolones Other (See Comments)    Patient was warned about  not using Cipro and similar antibiotics. Recent studies have raised concern that fluoroquinolone antibiotics could be associated with an increased risk of aortic aneurysm Fluoroquinolones have non-antimicrobial properties that might jeopardise the integrity of the extracellular matrix of the vascular wall In a  propensity score matched cohort study in [place], there was a 66% increased rate of aortic aneurysm or dissection associated with oral fluoroquinolone use, compared wit    Social History   Socioeconomic History   Marital status: Married    Spouse name: Not on file   Number of children: 3   Years of education: Not on file   Highest education level: Not on file  Occupational History   Occupation: Retired from [employer], [employer]: RETIRED  Tobacco Use   Smoking status: Former    Current packs/day: 0.00    Average packs/day: 1.5 packs/day for 36.0 years (54.0 ttl pk-yrs)    Types: Cigarettes    Start date: 75    Quit date: 01/24/1992    Years since quitting: 31.3   Smokeless tobacco: Never  Vaping Use   Vaping status: Never Used  Substance and Sexual Activity   Alcohol use: No   Drug use: No   Sexual activity: Not Currently  Other Topics Concern   Not on file  Social History Narrative  Not on file   Social Drivers of Health   Financial Resource Strain: Not on file  Food Insecurity: No Food Insecurity (06/15/2022)   Hunger Vital Sign    Worried About Running Out of Food in the Last Year: Never true    Ran Out of Food in the Last Year: Never true  Transportation Needs: No Transportation Needs (06/15/2022)   PRAPARE - Administrator, Civil Service (Medical): No    Lack of Transportation (Non-Medical): No  Physical Activity: Not on file  Stress: Not on file  Social Connections: Unknown (06/05/2021)   [facility]   Social Network    Social Network: Not on file  Intimate Partner Violence: Not At Risk (06/15/2022)   Humiliation,  Afraid, Rape, and Kick questionnaire    Fear of Current or Ex-Partner: No    Emotionally Abused: No    Physically Abused: No    Sexually Abused: No    Tobacco Use: Medium Risk (05/29/2023)   Patient History    Smoking Tobacco Use: Former    Smokeless Tobacco Use: Never    Passive Exposure: Not on file   Social History   Substance and Sexual Activity  Alcohol Use No    Family History  Problem Relation Age of Onset   Heart disease Mother    Diabetes Mother    Other Mother        stent placement   Emphysema Father 9   Heart attack Sister     ROS   Objective:  Physical Exam: Well nourished and well developed.  General: Alert and oriented x3, cooperative and pleasant, no acute distress.  Head: normocephalic, atraumatic, neck supple.  Eyes: EOMI.  Abdomen: non-tender to palpation and soft, normoactive bowel sounds. Musculoskeletal: - Evaluation of his left hip shows flexion to 100 degrees, minimal internal rotation, 10 degrees of external rotation, 10 degrees of abduction. - Significant antalgic gait pattern on the left. Calves soft and nontender. Motor function intact in LE. Strength 5/5 LE bilaterally. Neuro: Distal pulses 2+. Sensation to light touch intact in LE.  Vital signs in last 24 hours: Temp:  [97.7 F (36.5 C)] 97.7 F (36.5 C) (05/06 0831) Pulse Rate:  [57] 57 (05/06 0831) Resp:  [16] 16 (05/06 0831) BP: (114)/(60) 114/60 (05/06 0831) SpO2:  [99 %] 99 % (05/06 0831) Weight:  [106.6 kg] 106.6 kg (05/06 0831)  Imaging Review Plain radiographs demonstrate severe degenerative joint disease of the left hip. The bone quality appears to be adequate for age and reported activity level.  Assessment/Plan:  End stage arthritis, left hip  The patient history, physical examination, clinical judgement of the provider and imaging studies are consistent with end stage degenerative joint disease of the left hip and Carlos hip arthroplasty is deemed medically  necessary. The treatment options including medical management, injection therapy, arthroscopy and arthroplasty were discussed at length. The risks and benefits of Carlos hip arthroplasty were presented and reviewed. The risks due to aseptic loosening, infection, stiffness, dislocation/subluxation, thromboembolic complications and other imponderables were discussed. The patient acknowledged the explanation, agreed to proceed with the plan and consent was signed. Patient is being admitted for inpatient treatment for surgery, pain control, PT, OT, prophylactic antibiotics, VTE prophylaxis, progressive ambulation and ADLs and discharge planning.The patient is planning to be discharged  home .  Therapy Plans: HEP Disposition: Home with Wife Planned DVT Prophylaxis: Xarelto  10 mg (hx DVT/PE) DME Needed: None PCP: Tressia Fry, MD (clearance received)  Cardiologist: Peter [name], MD (appt 05/13) TXA: Topical Allergies: ACE inhibitors (cough), amoxicillin  (GI upset), codeine (N/V), metformin  (diarrhea), morphine, latex (rash/itching) Anesthesia Concerns: None BMI: 39.3 Last HgbA1c: 6.5% - 04/2023  Pharmacy: [facility] ([address])  Other: -Hx of DVT/PE (4-5 years ago) -Has watchman implant -Discussed Aquacel post-op - adhesives on arms are the issue with skin tears -Reports he has xarelto  at home from prior use -Has tolerated oxycodone  in the past, flexeril instead of methocarbamol   - Patient was instructed on what medications to stop prior to surgery. - Follow-up visit in 2 weeks with Dr. France Ina - Begin physical therapy following surgery - Pre-operative lab work as pre-surgical testing - Prescriptions will be provided in hospital at time of discharge  R. Brinton Canavan, PA-C Orthopedic Surgery [facility] [place]

## 2023-05-29 NOTE — Telephone Encounter (Signed)
 Returned call to patient who states he has been re-scheduled for hip surgery with Dr Rossie Coon on 06/11/23 (was originally set for 07/16/23) and their office states they still haven't received pre-op clearance from us . Chart shows preop appt was set for 06/25/23. Explained that he needs to get this done in order to send the clearance over. He will be out of town for a graduation from 5/8-5/12/25. Will route to pre-op pool and Sheryle Donning to see if this can be expedited.

## 2023-05-29 NOTE — Telephone Encounter (Signed)
 Patient calling to speak with nurse. States he has some question about his up coming procedure. Please advise

## 2023-06-05 ENCOUNTER — Ambulatory Visit: Attending: Internal Medicine | Admitting: Student

## 2023-06-05 DIAGNOSIS — Z01810 Encounter for preprocedural cardiovascular examination: Secondary | ICD-10-CM

## 2023-06-05 NOTE — Progress Notes (Signed)
 Virtual Visit via Telephone Note   Because of DEAN LUBBERS co-morbid illnesses, he is at least at moderate risk for complications without adequate follow up.  This format is felt to be most appropriate for this patient at this time.  The patient did not have access to video technology/had technical difficulties with video requiring transitioning to audio format only (telephone).  All issues noted in this document were discussed and addressed.  No physical exam could be performed with this format.  Please refer to the patient's chart for his consent to telehealth for [facility].  Evaluation Performed:  Preoperative cardiovascular risk assessment _____________   Date:  06/05/2023   Patient ID:  Carlos Dawson, DOB 09/01/1940, MRN [id number] Patient Location:  Home Provider location:   Office  Primary Care Provider:  Tena Feeling, MD Primary Cardiologist:  Peter [name], MD  Chief Complaint / Patient Profile   83 y.o. y/o male with a h/o CAD s/p BMS to OM 2 2012, chronic diastolic heart failure, pulmonary hypertension, PAF s/p LAAO with watchman placement May 2024 not on anticoagulation, thoracic aortic aneurysm, hypertension, hyperlipidemia, PE, OSA, COPD, prostate cancer, CKD stage III who is pending left total hip arthroplasty by Dr. France Ina on 06/11/2023 and presents today for telephonic preoperative cardiovascular risk assessment.  History of Present Illness    Carlos Dawson is a 83 y.o. male who presents via audio/video conferencing for a telehealth visit today.  Pt was last seen in cardiology clinic on 02/27/2023 by Dr. [name].  At that time JAIMON LOFGREEN was stable from a cardiac standpoint.  The patient is now pending procedure as outlined above. Since his last visit, he is doing well. Patient denies shortness of breath, dyspnea on exertion, lower extremity edema, orthopnea or PND. No chest pain, pressure, or tightness. No palpitations.  His activity is limited  by back pain therefore he does not participate in routine exercise. He is independent with ADLs. He is able to perform light to moderate household activities such as doing laundry, carrying in the groceries, and vacuuming.   Past Medical History    Past Medical History:  Diagnosis Date   Anticoagulant long-term use    Aortic root enlargement (HCC)    Ascending aortic aneurysm (HCC)    recent scan in October 2012 showing no change; followed by Dr. Nicanor Barge   ASCVD (arteriosclerotic cardiovascular disease)    Prior BMS to the 2nd OM in September 2012; with repeat cath in October showing patency   CAD (coronary artery disease)    a. s/p BMS to 2nd OM in Sept 2012; b. LexiScan  Myoview  (12/2012):  Inf infarct; bowel and motion artifact make study difficult to interpret; no ischemia; not gated; Low Risk   CHF (congestive heart failure) (HCC)    no recent issues 10/13/14   Chronic back pain    "all over my back" (05/11/2017)   Colonic polyp    Contact lens/glasses fitting    COPD (chronic obstructive pulmonary disease) (HCC)    Diastolic dysfunction    DVT (deep venous thrombosis) (HCC)    ?LLE   GERD (gastroesophageal reflux disease)    Hearing loss    Hearing loss    more so on left   Hemorrhoids    History of stomach ulcers    Hypertension    IBS (irritable bowel syndrome)    LVH (left ventricular hypertrophy)    OA (osteoarthritis)    "all over" (05/11/2017)   Obesities, morbid (  HCC)    OSA (obstructive sleep apnea)    PSG 03/30/97 AHI 21, BPAP 13/9   OSA on CPAP    PAF (paroxysmal atrial fibrillation) (HCC)    a. on Xarelto  b. s/p DCCV in 08/2016; b. Tikosyn  failed 04/16/17 with plans for Multaq  and possible Afib ablation with Dr. Nunzio Belch   Pneumonia    'several times" (05/11/2017)   Presence of Watchman left atrial appendage closure device 06/15/2022   24mm Watchman FLX Pro placed by Dr. Marven Slimmer   Prostate CA [facility])    Oncologist  DR. Cristal [facility] 09/24/14, undetermined tx    prostate; S/P "radiation and hormone injections"   Pulmonary embolism (HCC) 2008   "both lungs"   SOB (shortness of breath)    on excertion   Thoracic aortic aneurysm (HCC)    Aortic Size Index=     5.0    /Body surface area is 2.43 meters squared. = 2.05  < 2.75 cm/m2      4% risk per year 2.75 to 4.25          8% risk per year > 4.25 cm/m2    20% risk per year   Stable aneurysmal dilation of the ascending aorta with maximum AP diameter of 4.8 cm. Stable area of narrowing of the proximal most portion of the descending aorta measuring 2 cm., previously identified as an area of coarctation. No evidence of aortic dissection.  Coronary artery disease.  Normal appearance of the lungs.   Electronically Signed   By: Dobrinka  Dimitrova M.D.   On: 10/01/2014 08:50     Type II diabetes mellitus (HCC)    metphormin, average 154 dx 2017   Past Surgical History:  Procedure Laterality Date   ACHILLES TENDON REPAIR Bilateral    AORTIC ARCH ANGIOGRAPHY N/A 03/13/2017   Procedure: AORTIC ARCH ANGIOGRAPHY;  Surgeon: [name], [name], MD;  Location: [facility] INVASIVE CV LAB;  Service: Cardiovascular;  Laterality: N/A;   APPENDECTOMY     ATRIAL FIBRILLATION ABLATION  05/11/2017   ATRIAL FIBRILLATION ABLATION N/A 05/11/2017   Procedure: ATRIAL FIBRILLATION ABLATION;  Surgeon: Jolly Needle, MD;  Location: [facility] INVASIVE CV LAB;  Service: Cardiovascular;  Laterality: N/A;   BACK SURGERY     "I've had 7 back and 1 neck ORs" (05/11/2017)   BIOPSY  03/14/2018   Procedure: BIOPSY;  Surgeon: Genell Ken, MD;  Location: [facility] ENDOSCOPY;  Service: Gastroenterology;;  EGD and Colon   CARDIAC CATHETERIZATION  2006   CARPAL TUNNEL RELEASE Bilateral    LEFT   CATARACT EXTRACTION W/ INTRAOCULAR LENS  IMPLANT, BILATERAL Bilateral    CERVICAL SPINE SURGERY  06/02/2010   lower back and neck   COLONOSCOPY N/A 03/14/2018   Procedure: COLONOSCOPY;  Surgeon: Genell Ken, MD;  Location: [facility] ENDOSCOPY;  Service: Gastroenterology;  Laterality:  N/A;   COLONOSCOPY WITH PROPOFOL  N/A 12/29/2014   Procedure: COLONOSCOPY WITH PROPOFOL ;  Surgeon: Garrett Kallman, MD;  Location: [facility] ENDOSCOPY;  Service: Endoscopy;  Laterality: N/A;   CORONARY ANGIOPLASTY WITH STENT PLACEMENT  October 2012   CORONARY STENT PLACEMENT  Sept 2012   2nd OM with BMS   ESOPHAGOGASTRODUODENOSCOPY N/A 03/14/2018   Procedure: ESOPHAGOGASTRODUODENOSCOPY (EGD);  Surgeon: Genell Ken, MD;  Location: [facility];  Service: Gastroenterology;  Laterality: N/A;   HEMORROIDECTOMY     LAMINECTOMY  05/30/2012   L 4 L5   LAMINECTOMY WITH POSTERIOR LATERAL ARTHRODESIS LEVEL 3 N/A 10/18/2016   Procedure: Posterior Lateral Fusion - Lumbar One-Four, segmental instrumentation  Lumbar One-Five,  decompression,;  Surgeon: Isadora Mar, MD;  Location: [facility] OR;  Service: Neurosurgery;  Laterality: N/A;   LAPAROSCOPIC CHOLECYSTECTOMY     LAPAROSCOPIC GASTRIC BANDING     LEFT AND RIGHT HEART CATHETERIZATION WITH CORONARY ANGIOGRAM N/A 05/07/2014   Procedure: LEFT AND RIGHT HEART CATHETERIZATION WITH CORONARY ANGIOGRAM;  Surgeon: [name] [name], MD;  Location: [facility] CATH LAB;  Service: Cardiovascular;  Laterality: N/A;   LEFT ATRIAL APPENDAGE OCCLUSION N/A 06/15/2022   Procedure: LEFT ATRIAL APPENDAGE OCCLUSION;  Surgeon: Boyce Byes, MD;  Location: [facility] INVASIVE CV LAB;  Service: Cardiovascular;  Laterality: N/A;   LEFT HEART CATH AND CORONARY ANGIOGRAPHY N/A 03/13/2017   Procedure: LEFT HEART CATH AND CORONARY ANGIOGRAPHY;  Surgeon: [name], [name], MD;  Location: [facility] INVASIVE CV LAB;  Service: Cardiovascular;  Laterality: N/A;   LUMBAR LAMINECTOMY/DECOMPRESSION MICRODISCECTOMY N/A 05/04/2016   Procedure: Laminectomy and Foraminotomy - Thoracic twelve-Lumbar one -Posterior Fusion Lumbar one-two;  Surgeon: Isadora Mar, MD;  Location: [facility] OR;  Service: Neurosurgery;  Laterality: N/A;   POLYPECTOMY  03/14/2018   Procedure: POLYPECTOMY;  Surgeon: Genell Ken, MD;  Location: [facility] ENDOSCOPY;  Service:  Gastroenterology;;   POSTERIOR LUMBAR FUSION  10/18/2016   SHOULDER OPEN ROTATOR CUFF REPAIR Bilateral    TEE WITHOUT CARDIOVERSION N/A 06/15/2022   Procedure: TRANSESOPHAGEAL ECHOCARDIOGRAM;  Surgeon: Boyce Byes, MD;  Location: [facility] INVASIVE CV LAB;  Service: Cardiovascular;  Laterality: N/A;   TONSILLECTOMY AND ADENOIDECTOMY     TOTAL HIP ARTHROPLASTY Right 10/18/2018   Procedure: RIGHT TOTAL HIP ARTHROPLASTY ANTERIOR APPROACH;  Surgeon: Arnie Lao, MD;  Location: [facility] ORS;  Service: Orthopedics;  Laterality: Right;   TRIGGER FINGER RELEASE     LEFT   UVULOPALATOPHARYNGOPLASTY     VASECTOMY      Allergies  Allergies  Allergen Reactions   Lotensin [Benazepril] Cough   Tofranil [Imipramine]     Weight gain   Ace Inhibitors Cough   Adhesive [Tape] Itching and Rash   Amoxicillin -Pot Clavulanate Other (See Comments)    GI Upset (intolerance)   Codeine Nausea Only   Latex Itching, Rash and Other (See Comments)    Bandaids   Metformin  Diarrhea   Morphine Itching   Nifedipine Swelling     leg edema   Quinolones Other (See Comments)    Patient was warned about not using Cipro and similar antibiotics. Recent studies have raised concern that fluoroquinolone antibiotics could be associated with an increased risk of aortic aneurysm Fluoroquinolones have non-antimicrobial properties that might jeopardise the integrity of the extracellular matrix of the vascular wall In a  propensity score matched cohort study in [place], there was a 66% increased rate of aortic aneurysm or dissection associated with oral fluoroquinolone use, compared wit    Home Medications    Prior to Admission medications   Medication Sig Start Date End Date Taking? Authorizing Provider  acetaminophen  (TYLENOL ) 500 MG tablet Take 1,000 mg by mouth every 8 (eight) hours as needed for mild pain or headache.     [provider]  albuterol  (VENTOLIN  HFA) 108 (90 Base) MCG/ACT inhaler Inhale 2  puffs into the lungs every 4 (four) hours as needed for wheezing or shortness of breath. 10/30/22   Parrett, Macdonald Savoy, NP  aspirin  EC 81 MG tablet Take 1 tablet (81 mg total) by mouth daily. Swallow whole. 06/15/22 06/15/23  McDaniel, Jill D, NP  budesonide -formoterol  (SYMBICORT ) 160-4.5 MCG/ACT inhaler Inhale 2 puffs into the lungs daily as needed (  Asthma). Patient taking differently: Inhale 2 puffs into the lungs 2 (two) times daily as needed (Asthma). 10/30/22   Parrett, Macdonald Savoy, NP  celecoxib  (CELEBREX ) 200 MG capsule Take 200 mg by mouth daily as needed for moderate pain (pain score 4-6). 02/23/23   [provider]  cetirizine (ZYRTEC) 10 MG tablet Take 10 mg by mouth daily.    [provider]  Cholecalciferol  (VITAMIN D3 MAXIMUM STRENGTH) 125 MCG (5000 UT) capsule Take 5,000 Units by mouth daily.    [provider]  Ferrous Sulfate Dried (FEOSOL) 200 (65 Fe) MG TABS Take 1 tablet by mouth every other day.    [provider]  furosemide  (LASIX ) 40 MG tablet TAKE ONE TABLET (40MG ) BY MOUTH EACH DAY AS NEEDED FOR SWELLING OR WEIGHT GAIN. Patient taking differently: Take 40 mg by mouth daily. May take a second 40 mg dose as needed for swelling 03/20/22   [name], [name], MD  furosemide  (LASIX ) 80 MG tablet Take 0.5 tablets (40 mg total) by mouth daily. Patient not taking: Reported on 05/22/2023 02/27/23 05/28/23  [name], [name], MD  ipratropium (ATROVENT ) 0.03 % nasal spray Place 2 sprays into both nostrils every 12 (twelve) hours. Patient not taking: Reported on 05/22/2023 03/02/23   Parrett, Macdonald Savoy, NP  JARDIANCE 25 MG TABS tablet Take 25 mg by mouth daily. 04/18/18   [provider]  losartan  (COZAAR ) 25 MG tablet Take 1 tablet (25 mg total) by mouth daily. 03/20/23   [name], [name], MD  nitroGLYCERIN  (NITROSTAT ) 0.4 MG SL tablet Place 1 tablet (0.4 mg total) under the tongue every 5 (five) minutes as needed for chest pain. 09/08/21   [name], [name], MD   pantoprazole  (PROTONIX ) 40 MG tablet TAKE ONE TABLET BY MOUTH BEFORE BREAKFAST (TAKE ON AN EMPTY STOMACH 30 MINUTES PRIOR TO A MEAL) 03/09/21   [provider]  Polyethyl Glycol-Propyl Glycol (SYSTANE OP) Place 1 drop into both eyes daily as needed (for dry eyes).     [provider]  potassium chloride  SA (KLOR-CON  M20) 20 MEQ tablet Taking two tablet by mouth in the am and 1 tablet in the evening 09/22/22   [name], [name], MD  pregabalin (LYRICA) 50 MG capsule Take 50 mg by mouth at bedtime as needed (pain). 02/23/23   [provider]  psyllium (METAMUCIL) 58.6 % packet Take 1 packet by mouth daily as needed (Looose stool).    [provider]  sildenafil  (VIAGRA ) 100 MG tablet Take 1 tablet (100 mg total) by mouth as needed for erectile dysfunction. 07/13/20   [name], [name], MD  Skin Protectants, Misc. (EUCERIN) cream Apply 1 application topically as needed for dry skin.    [provider]  spironolactone  (ALDACTONE ) 25 MG tablet TAKE 1 TABLET (25 MG TOTAL) BY MOUTH DAILY. 05/24/23   [name], [name], MD  tamsulosin  (FLOMAX ) 0.4 MG CAPS Take 0.4 mg by mouth every evening.     [provider]  Tiotropium Bromide Monohydrate  (SPIRIVA  RESPIMAT) 2.5 MCG/ACT AERS Inhale 2 puffs into the lungs daily as needed (shortness of breath).    [provider]  topiramate  (TOPAMAX ) 25 MG capsule Take 50 mg by mouth 2 (two) times daily.     [provider]  umeclidinium bromide  (INCRUSE ELLIPTA ) 62.5 MCG/ACT AEPB Inhale 1 puff into the lungs daily. 03/20/23   Parrett, Macdonald Savoy, NP    Physical Exam    Vital Signs:  BRANDUN ROH does not have  vital signs available for review today.  Given telephonic nature of communication, physical exam is limited. AAOx3. NAD. Normal affect.  Speech and respirations are unlabored.  Assessment & Plan    Primary Cardiologist: Peter [name], MD  Preoperative cardiovascular risk assessment.  Left total hip  arthroplasty by Dr. France Ina on 06/11/2023  Chart reviewed as part of pre-operative protocol coverage. According to the RCRI, patient has a 6.6% risk of MACE. Patient reports activity equivalent to 4.4 METS (per DASI).   Given past medical history and time since last visit, based on ACC/AHA guidelines, RITA DEWINTER would be at acceptable risk for the planned procedure without further cardiovascular testing.   Patient was advised that if he develops new symptoms prior to surgery to contact our office to arrange a follow-up appointment.  he verbalized understanding.  Ideally aspirin  should be continued without interruption, however if the bleeding risk is too great, aspirin  may be held for 5-7 days prior to surgery. Please resume aspirin  post operatively when it is felt to be safe from a bleeding standpoint. Jardiance prescribed by a noncardiology provider therefore recommendations for holding deferred to prescribing provider.      I will route this recommendation to the requesting party via Epic fax function.  Please call with questions.  Time:   Today, I have spent 5 minutes with the patient with telehealth technology discussing medical history, symptoms, and management plan.     Morey Ar, NP  06/05/2023, 7:14 AM

## 2023-06-05 NOTE — Progress Notes (Signed)
 Anesthesia Chart Review   Case: [id number] Date/Time: 06/11/23 1225   Procedure: ARTHROPLASTY, HIP, TOTAL, ANTERIOR APPROACH (Left: Hip)   Anesthesia type: Choice   Diagnosis: Primary osteoarthritis of left hip [M16.12]   Pre-op diagnosis: Left Hip Osteoarthritis   Location: [facility] ROOM 10 / [facility]   Surgeons: Liliane Rei, MD       DISCUSSION: 83 y.o. former smoker with h/o HTN, COPD, OSA w/CPAP, PE, PAF s/p watchman placement 05/2022, AAA, mild AV stenosis, CAD, chronic diastolic heart failure, left hip OA scheduled for above procedure 06/11/2023 with Dr. Liliane Rei.   Per cardiology preoperative evaluation 06/05/2023, "Chart reviewed as part of pre-operative protocol coverage. According to the RCRI, patient has a 6.6% risk of MACE. Patient reports activity equivalent to 4.4 METS (per [facility]).    Given past medical history and time since last visit, based on ACC/AHA guidelines, Carlos Dawson would be at acceptable risk for the planned procedure without further cardiovascular testing.    Patient was advised that if he develops new symptoms prior to surgery to contact our office to arrange a follow-up appointment.  he verbalized understanding.   Ideally aspirin  should be continued without interruption, however if the bleeding risk is too great, aspirin  may be held for 5-7 days prior to surgery. Please resume aspirin  post operatively when it is felt to be safe from a bleeding standpoint. Jardiance prescribed by a noncardiology provider therefore recommendations for holding deferred to prescribing provider."  Clearance from PCP received.  VS: BP 114/60   Pulse (!) 57   Temp 36.5 C (Oral)   Resp 16   Ht 5' 4.75" (1.645 m)   Wt 106.6 kg   SpO2 99%   BMI 39.41 kg/m   PROVIDERS: Tena Feeling, MD is PCP   Cardiologist : Peter [name], MD    LABS: Labs reviewed: Acceptable for surgery. (all labs ordered are listed, but only abnormal results are displayed)  Labs Reviewed  HEMOGLOBIN  A1C - Abnormal; Notable for the following components:      Result Value   Hgb A1c MFr Bld 5.7 (*)    All other components within normal limits  BASIC METABOLIC PANEL WITH GFR - Abnormal; Notable for the following components:   Glucose, Bld 130 (*)    BUN 24 (*)    Calcium  8.6 (*)    Anion gap 4 (*)    All other components within normal limits  CBC - Abnormal; Notable for the following components:   RBC 2.63 (*)    Hemoglobin 11.2 (*)    HCT 30.3 (*)    MCV 115.2 (*)    MCH 42.6 (*)    MCHC 37.0 (*)    Platelets 141 (*)    All other components within normal limits  GLUCOSE, CAPILLARY - Abnormal; Notable for the following components:   Glucose-Capillary 122 (*)    All other components within normal limits  SURGICAL PCR SCREEN  TYPE AND SCREEN     IMAGES:   EKG:   CV: Echo 10/26/2022 1. Left ventricular ejection fraction, by estimation, is 60 to 65%. The  left ventricle has normal function. The left ventricle has no regional  wall motion abnormalities. There is mild concentric left ventricular  hypertrophy. Left ventricular diastolic  parameters are consistent with Grade I diastolic dysfunction (impaired  relaxation). Elevated left atrial pressure. The average left ventricular  global longitudinal strain is -16.7 %. The global longitudinal strain is  abnormal.   2. Right ventricular  systolic function is normal. The right ventricular  size is normal.   3. Left atrial size was mildly dilated.   4. The mitral valve is normal in structure. Trivial mitral valve  regurgitation. No evidence of mitral stenosis.   5. The aortic valve is bicuspid. Aortic valve regurgitation is not  visualized. Mild aortic valve stenosis.   6. Aortic dilatation noted. There is moderate dilatation of the aortic  root, measuring 47 mm. There is severe dilatation of the ascending aorta,  measuring 52 mm.   7. The inferior vena cava is normal in size with greater than 50%  respiratory variability,  suggesting right atrial pressure of 3 mmHg.   Past Medical History:  Diagnosis Date   Anticoagulant long-term use    Aortic root enlargement (HCC)    Ascending aortic aneurysm (HCC)    recent scan in October 2012 showing no change; followed by Dr. Nicanor Barge   ASCVD (arteriosclerotic cardiovascular disease)    Prior BMS to the 2nd OM in September 2012; with repeat cath in October showing patency   CAD (coronary artery disease)    a. s/p BMS to 2nd OM in Sept 2012; b. LexiScan  Myoview  (12/2012):  Inf infarct; bowel and motion artifact make study difficult to interpret; no ischemia; not gated; Low Risk   CHF (congestive heart failure) (HCC)    no recent issues 10/13/14   Chronic back pain    "all over my back" (05/11/2017)   Colonic polyp    Contact lens/glasses fitting    COPD (chronic obstructive pulmonary disease) (HCC)    Diastolic dysfunction    DVT (deep venous thrombosis) (HCC)    ?LLE   GERD (gastroesophageal reflux disease)    Hearing loss    Hearing loss    more so on left   Hemorrhoids    History of stomach ulcers    Hypertension    IBS (irritable bowel syndrome)    LVH (left ventricular hypertrophy)    OA (osteoarthritis)    "all over" (05/11/2017)   Obesities, morbid (HCC)    OSA (obstructive sleep apnea)    PSG 03/30/97 AHI 21, BPAP 13/9   OSA on CPAP    PAF (paroxysmal atrial fibrillation) (HCC)    a. on Xarelto  b. s/p DCCV in 08/2016; b. Tikosyn  failed 04/16/17 with plans for Multaq  and possible Afib ablation with Dr. Nunzio Belch   Pneumonia    'several times" (05/11/2017)   Presence of Watchman left atrial appendage closure device 06/15/2022   24mm Watchman FLX Pro placed by Dr. Marven Slimmer   Prostate CA [facility])    Oncologist  DR. Cristal [facility] 09/24/14, undetermined tx   prostate; S/P "radiation and hormone injections"   Pulmonary embolism (HCC) 2008   "both lungs"   SOB (shortness of breath)    on excertion   Thoracic aortic aneurysm (HCC)    Aortic Size Index=      5.0    /Body surface area is 2.43 meters squared. = 2.05  < 2.75 cm/m2      4% risk per year 2.75 to 4.25          8% risk per year > 4.25 cm/m2    20% risk per year   Stable aneurysmal dilation of the ascending aorta with maximum AP diameter of 4.8 cm. Stable area of narrowing of the proximal most portion of the descending aorta measuring 2 cm., previously identified as an area of coarctation. No evidence of aortic dissection.  Coronary artery disease.  Normal appearance of the lungs.   Electronically Signed   By: Dobrinka  Dimitrova M.D.   On: 10/01/2014 08:50     Type II diabetes mellitus (HCC)    metphormin, average 154 dx 2017    Past Surgical History:  Procedure Laterality Date   ACHILLES TENDON REPAIR Bilateral    AORTIC ARCH ANGIOGRAPHY N/A 03/13/2017   Procedure: AORTIC ARCH ANGIOGRAPHY;  Surgeon: [name], [name], MD;  Location: [facility] INVASIVE CV LAB;  Service: Cardiovascular;  Laterality: N/A;   APPENDECTOMY     ATRIAL FIBRILLATION ABLATION  05/11/2017   ATRIAL FIBRILLATION ABLATION N/A 05/11/2017   Procedure: ATRIAL FIBRILLATION ABLATION;  Surgeon: Jolly Needle, MD;  Location: [facility] INVASIVE CV LAB;  Service: Cardiovascular;  Laterality: N/A;   BACK SURGERY     "I've had 7 back and 1 neck ORs" (05/11/2017)   BIOPSY  03/14/2018   Procedure: BIOPSY;  Surgeon: Genell Ken, MD;  Location: [facility];  Service: Gastroenterology;;  EGD and Colon   CARDIAC CATHETERIZATION  2006   CARPAL TUNNEL RELEASE Bilateral    LEFT   CATARACT EXTRACTION W/ INTRAOCULAR LENS  IMPLANT, BILATERAL Bilateral    CERVICAL SPINE SURGERY  06/02/2010   lower back and neck   COLONOSCOPY N/A 03/14/2018   Procedure: COLONOSCOPY;  Surgeon: Genell Ken, MD;  [facility];  Service: Gastroenterology;  Laterality: N/A;   COLONOSCOPY WITH PROPOFOL  N/A 12/29/2014   Procedure: COLONOSCOPY WITH PROPOFOL ;  Surgeon: Garrett Kallman, MD;  Location[facility];  Service: Endoscopy;  Laterality: N/A;   CORONARY  ANGIOPLASTY WITH STENT PLACEMENT  October 2012   CORONARY STENT PLACEMENT  Sept 2012   2nd OM with BMS   ESOPHAGOGASTRODUODENOSCOPY N/A 03/14/2018   Procedure: ESOPHAGOGASTRODUODENOSCOPY (EGD);  Surgeon: Genell Ken, MD;  Location: [facility];  Service: Gastroenterology;  Laterality: N/A;   HEMORROIDECTOMY     LAMINECTOMY  05/30/2012   L 4 L5   LAMINECTOMY WITH POSTERIOR LATERAL ARTHRODESIS LEVEL 3 N/A 10/18/2016   Procedure: Posterior Lateral Fusion - Lumbar One-Four, segmental instrumentation Lumbar One-Five,  decompression,;  Surgeon: Isadora Mar, MD;  Location: [facility] OR;  Service: Neurosurgery;  Laterality: N/A;   LAPAROSCOPIC CHOLECYSTECTOMY     LAPAROSCOPIC GASTRIC BANDING     LEFT AND RIGHT HEART CATHETERIZATION WITH CORONARY ANGIOGRAM N/A 05/07/2014   Procedure: LEFT AND RIGHT HEART CATHETERIZATION WITH CORONARY ANGIOGRAM;  Surgeon: [name] [name], MD;  Location: [facility] CATH LAB;  Service: Cardiovascular;  Laterality: N/A;   LEFT ATRIAL APPENDAGE OCCLUSION N/A 06/15/2022   Procedure: LEFT ATRIAL APPENDAGE OCCLUSION;  Surgeon: Boyce Byes, MD;  Location: [facility] INVASIVE CV LAB;  Service: Cardiovascular;  Laterality: N/A;   LEFT HEART CATH AND CORONARY ANGIOGRAPHY N/A 03/13/2017   Procedure: LEFT HEART CATH AND CORONARY ANGIOGRAPHY;  Surgeon: [name], [name], MD;  Location: [facility] INVASIVE CV LAB;  Service: Cardiovascular;  Laterality: N/A;   LUMBAR LAMINECTOMY/DECOMPRESSION MICRODISCECTOMY N/A 05/04/2016   Procedure: Laminectomy and Foraminotomy - Thoracic twelve-Lumbar one -Posterior Fusion Lumbar one-two;  Surgeon: Isadora Mar, MD;  Location: [facility] OR;  Service: Neurosurgery;  Laterality: N/A;   POLYPECTOMY  03/14/2018   Procedure: POLYPECTOMY;  Surgeon: Genell Ken, MD;  [facility];  Service: Gastroenterology;;   POSTERIOR LUMBAR FUSION  10/18/2016   SHOULDER OPEN ROTATOR CUFF REPAIR Bilateral    TEE WITHOUT CARDIOVERSION N/A 06/15/2022   Procedure: TRANSESOPHAGEAL ECHOCARDIOGRAM;   Surgeon: Boyce Byes, MD;  Location: [facility] INVASIVE CV LAB;  Service: Cardiovascular;  Laterality: N/A;  TONSILLECTOMY AND ADENOIDECTOMY     TOTAL HIP ARTHROPLASTY Right 10/18/2018   Procedure: RIGHT TOTAL HIP ARTHROPLASTY ANTERIOR APPROACH;  Surgeon: Arnie Lao, MD;  Location[facility];  Service: Orthopedics;  Laterality: Right;   TRIGGER FINGER RELEASE     LEFT   UVULOPALATOPHARYNGOPLASTY     VASECTOMY      MEDICATIONS:  acetaminophen  (TYLENOL ) 500 MG tablet   albuterol  (VENTOLIN  HFA) 108 (90 Base) MCG/ACT inhaler   aspirin  EC 81 MG tablet   budesonide -formoterol  (SYMBICORT ) 160-4.5 MCG/ACT inhaler   celecoxib  (CELEBREX ) 200 MG capsule   cetirizine (ZYRTEC) 10 MG tablet   Cholecalciferol  (VITAMIN D3 MAXIMUM STRENGTH) 125 MCG (5000 UT) capsule   Ferrous Sulfate Dried (FEOSOL) 200 (65 Fe) MG TABS   furosemide  (LASIX ) 40 MG tablet   furosemide  (LASIX ) 80 MG tablet   ipratropium (ATROVENT ) 0.03 % nasal spray   JARDIANCE 25 MG TABS tablet   losartan  (COZAAR ) 25 MG tablet   nitroGLYCERIN  (NITROSTAT ) 0.4 MG SL tablet   pantoprazole  (PROTONIX ) 40 MG tablet   Polyethyl Glycol-Propyl Glycol (SYSTANE OP)   potassium chloride  SA (KLOR-CON  M20) 20 MEQ tablet   pregabalin (LYRICA) 50 MG capsule   psyllium (METAMUCIL) 58.6 % packet   sildenafil  (VIAGRA ) 100 MG tablet   Skin Protectants, Misc. (EUCERIN) cream   spironolactone  (ALDACTONE ) 25 MG tablet   tamsulosin  (FLOMAX ) 0.4 MG CAPS   Tiotropium Bromide Monohydrate  (SPIRIVA  RESPIMAT) 2.5 MCG/ACT AERS   topiramate  (TOPAMAX ) 25 MG capsule   umeclidinium bromide  (INCRUSE ELLIPTA ) 62.5 MCG/ACT AEPB   No current facility-administered medications for this encounter.    Chick Cotton Ward, PA-C [facility] [phone]

## 2023-06-11 ENCOUNTER — Ambulatory Visit (HOSPITAL_COMMUNITY)

## 2023-06-11 ENCOUNTER — Ambulatory Visit (HOSPITAL_BASED_OUTPATIENT_CLINIC_OR_DEPARTMENT_OTHER): Admitting: Anesthesiology

## 2023-06-11 ENCOUNTER — Observation Stay (HOSPITAL_COMMUNITY)

## 2023-06-11 ENCOUNTER — Encounter (HOSPITAL_COMMUNITY): Admission: RE | Disposition: E | Payer: Self-pay | Source: Home / Self Care | Attending: Orthopedic Surgery

## 2023-06-11 ENCOUNTER — Other Ambulatory Visit: Payer: Self-pay

## 2023-06-11 ENCOUNTER — Inpatient Hospital Stay (HOSPITAL_COMMUNITY)
Admission: RE | Admit: 2023-06-11 | Discharge: 2023-07-24 | DRG: 469 | Disposition: E | Attending: Pulmonary Disease | Admitting: Pulmonary Disease

## 2023-06-11 ENCOUNTER — Ambulatory Visit (HOSPITAL_COMMUNITY): Payer: Self-pay | Admitting: Physician Assistant

## 2023-06-11 ENCOUNTER — Encounter (HOSPITAL_COMMUNITY): Payer: Self-pay | Admitting: Orthopedic Surgery

## 2023-06-11 DIAGNOSIS — Z95818 Presence of other cardiac implants and grafts: Secondary | ICD-10-CM

## 2023-06-11 DIAGNOSIS — M1612 Unilateral primary osteoarthritis, left hip: Principal | ICD-10-CM | POA: Diagnosis present

## 2023-06-11 DIAGNOSIS — Z7982 Long term (current) use of aspirin: Secondary | ICD-10-CM

## 2023-06-11 DIAGNOSIS — I469 Cardiac arrest, cause unspecified: Secondary | ICD-10-CM | POA: Diagnosis not present

## 2023-06-11 DIAGNOSIS — I4729 Other ventricular tachycardia: Secondary | ICD-10-CM | POA: Diagnosis not present

## 2023-06-11 DIAGNOSIS — Z8711 Personal history of peptic ulcer disease: Secondary | ICD-10-CM

## 2023-06-11 DIAGNOSIS — G8929 Other chronic pain: Secondary | ICD-10-CM | POA: Diagnosis present

## 2023-06-11 DIAGNOSIS — I4819 Other persistent atrial fibrillation: Secondary | ICD-10-CM | POA: Diagnosis not present

## 2023-06-11 DIAGNOSIS — I7121 Aneurysm of the ascending aorta, without rupture: Secondary | ICD-10-CM | POA: Diagnosis present

## 2023-06-11 DIAGNOSIS — Z880 Allergy status to penicillin: Secondary | ICD-10-CM

## 2023-06-11 DIAGNOSIS — Z7409 Other reduced mobility: Secondary | ICD-10-CM | POA: Diagnosis present

## 2023-06-11 DIAGNOSIS — Z91040 Latex allergy status: Secondary | ICD-10-CM

## 2023-06-11 DIAGNOSIS — Y838 Other surgical procedures as the cause of abnormal reaction of the patient, or of later complication, without mention of misadventure at the time of the procedure: Secondary | ICD-10-CM | POA: Diagnosis not present

## 2023-06-11 DIAGNOSIS — Z981 Arthrodesis status: Secondary | ICD-10-CM | POA: Diagnosis not present

## 2023-06-11 DIAGNOSIS — Z7951 Long term (current) use of inhaled steroids: Secondary | ICD-10-CM

## 2023-06-11 DIAGNOSIS — R579 Shock, unspecified: Secondary | ICD-10-CM | POA: Diagnosis not present

## 2023-06-11 DIAGNOSIS — Z9049 Acquired absence of other specified parts of digestive tract: Secondary | ICD-10-CM

## 2023-06-11 DIAGNOSIS — Z6841 Body Mass Index (BMI) 40.0 and over, adult: Secondary | ICD-10-CM | POA: Diagnosis not present

## 2023-06-11 DIAGNOSIS — Z923 Personal history of irradiation: Secondary | ICD-10-CM

## 2023-06-11 DIAGNOSIS — E872 Acidosis, unspecified: Secondary | ICD-10-CM | POA: Diagnosis not present

## 2023-06-11 DIAGNOSIS — K5669 Other partial intestinal obstruction: Secondary | ICD-10-CM | POA: Diagnosis not present

## 2023-06-11 DIAGNOSIS — N183 Chronic kidney disease, stage 3 unspecified: Secondary | ICD-10-CM | POA: Diagnosis present

## 2023-06-11 DIAGNOSIS — I5032 Chronic diastolic (congestive) heart failure: Secondary | ICD-10-CM | POA: Diagnosis not present

## 2023-06-11 DIAGNOSIS — K567 Ileus, unspecified: Secondary | ICD-10-CM | POA: Diagnosis not present

## 2023-06-11 DIAGNOSIS — I272 Pulmonary hypertension, unspecified: Secondary | ICD-10-CM | POA: Diagnosis present

## 2023-06-11 DIAGNOSIS — J9811 Atelectasis: Secondary | ICD-10-CM | POA: Diagnosis not present

## 2023-06-11 DIAGNOSIS — I4891 Unspecified atrial fibrillation: Secondary | ICD-10-CM | POA: Diagnosis not present

## 2023-06-11 DIAGNOSIS — Z955 Presence of coronary angioplasty implant and graft: Secondary | ICD-10-CM

## 2023-06-11 DIAGNOSIS — Z825 Family history of asthma and other chronic lower respiratory diseases: Secondary | ICD-10-CM

## 2023-06-11 DIAGNOSIS — F05 Delirium due to known physiological condition: Secondary | ICD-10-CM | POA: Diagnosis not present

## 2023-06-11 DIAGNOSIS — Z96651 Presence of right artificial knee joint: Secondary | ICD-10-CM | POA: Diagnosis present

## 2023-06-11 DIAGNOSIS — M545 Low back pain, unspecified: Secondary | ICD-10-CM | POA: Diagnosis present

## 2023-06-11 DIAGNOSIS — Z9181 History of falling: Secondary | ICD-10-CM

## 2023-06-11 DIAGNOSIS — K589 Irritable bowel syndrome without diarrhea: Secondary | ICD-10-CM | POA: Diagnosis present

## 2023-06-11 DIAGNOSIS — Y92239 Unspecified place in hospital as the place of occurrence of the external cause: Secondary | ICD-10-CM | POA: Diagnosis not present

## 2023-06-11 DIAGNOSIS — J9601 Acute respiratory failure with hypoxia: Secondary | ICD-10-CM | POA: Diagnosis not present

## 2023-06-11 DIAGNOSIS — Z860100 Personal history of colon polyps, unspecified: Secondary | ICD-10-CM

## 2023-06-11 DIAGNOSIS — N179 Acute kidney failure, unspecified: Secondary | ICD-10-CM | POA: Diagnosis present

## 2023-06-11 DIAGNOSIS — L8990 Pressure ulcer of unspecified site, unspecified stage: Secondary | ICD-10-CM | POA: Insufficient documentation

## 2023-06-11 DIAGNOSIS — Z86718 Personal history of other venous thrombosis and embolism: Secondary | ICD-10-CM

## 2023-06-11 DIAGNOSIS — R000 Tachycardia, unspecified: Secondary | ICD-10-CM | POA: Diagnosis not present

## 2023-06-11 DIAGNOSIS — I480 Paroxysmal atrial fibrillation: Secondary | ICD-10-CM | POA: Diagnosis not present

## 2023-06-11 DIAGNOSIS — I9789 Other postprocedural complications and disorders of the circulatory system, not elsewhere classified: Secondary | ICD-10-CM | POA: Diagnosis not present

## 2023-06-11 DIAGNOSIS — K56609 Unspecified intestinal obstruction, unspecified as to partial versus complete obstruction: Secondary | ICD-10-CM | POA: Diagnosis not present

## 2023-06-11 DIAGNOSIS — Z515 Encounter for palliative care: Secondary | ICD-10-CM

## 2023-06-11 DIAGNOSIS — Z8249 Family history of ischemic heart disease and other diseases of the circulatory system: Secondary | ICD-10-CM

## 2023-06-11 DIAGNOSIS — I9581 Postprocedural hypotension: Secondary | ICD-10-CM | POA: Diagnosis not present

## 2023-06-11 DIAGNOSIS — J309 Allergic rhinitis, unspecified: Secondary | ICD-10-CM | POA: Diagnosis present

## 2023-06-11 DIAGNOSIS — H9190 Unspecified hearing loss, unspecified ear: Secondary | ICD-10-CM | POA: Diagnosis present

## 2023-06-11 DIAGNOSIS — I5033 Acute on chronic diastolic (congestive) heart failure: Secondary | ICD-10-CM | POA: Diagnosis not present

## 2023-06-11 DIAGNOSIS — Z7189 Other specified counseling: Secondary | ICD-10-CM

## 2023-06-11 DIAGNOSIS — K9189 Other postprocedural complications and disorders of digestive system: Secondary | ICD-10-CM | POA: Diagnosis not present

## 2023-06-11 DIAGNOSIS — E876 Hypokalemia: Secondary | ICD-10-CM | POA: Diagnosis present

## 2023-06-11 DIAGNOSIS — Z452 Encounter for adjustment and management of vascular access device: Secondary | ICD-10-CM | POA: Diagnosis not present

## 2023-06-11 DIAGNOSIS — J9602 Acute respiratory failure with hypercapnia: Secondary | ICD-10-CM | POA: Diagnosis not present

## 2023-06-11 DIAGNOSIS — G4733 Obstructive sleep apnea (adult) (pediatric): Secondary | ICD-10-CM | POA: Diagnosis present

## 2023-06-11 DIAGNOSIS — R0602 Shortness of breath: Secondary | ICD-10-CM | POA: Diagnosis present

## 2023-06-11 DIAGNOSIS — Z9842 Cataract extraction status, left eye: Secondary | ICD-10-CM

## 2023-06-11 DIAGNOSIS — Z888 Allergy status to other drugs, medicaments and biological substances status: Secondary | ICD-10-CM

## 2023-06-11 DIAGNOSIS — K219 Gastro-esophageal reflux disease without esophagitis: Secondary | ICD-10-CM | POA: Diagnosis present

## 2023-06-11 DIAGNOSIS — R918 Other nonspecific abnormal finding of lung field: Secondary | ICD-10-CM | POA: Diagnosis not present

## 2023-06-11 DIAGNOSIS — R1030 Lower abdominal pain, unspecified: Secondary | ICD-10-CM | POA: Diagnosis not present

## 2023-06-11 DIAGNOSIS — Z471 Aftercare following joint replacement surgery: Secondary | ICD-10-CM | POA: Diagnosis not present

## 2023-06-11 DIAGNOSIS — I493 Ventricular premature depolarization: Secondary | ICD-10-CM | POA: Diagnosis not present

## 2023-06-11 DIAGNOSIS — I959 Hypotension, unspecified: Secondary | ICD-10-CM | POA: Diagnosis present

## 2023-06-11 DIAGNOSIS — R001 Bradycardia, unspecified: Secondary | ICD-10-CM | POA: Diagnosis not present

## 2023-06-11 DIAGNOSIS — Z961 Presence of intraocular lens: Secondary | ICD-10-CM | POA: Diagnosis present

## 2023-06-11 DIAGNOSIS — Z9852 Vasectomy status: Secondary | ICD-10-CM

## 2023-06-11 DIAGNOSIS — Z4682 Encounter for fitting and adjustment of non-vascular catheter: Secondary | ICD-10-CM | POA: Diagnosis not present

## 2023-06-11 DIAGNOSIS — Z885 Allergy status to narcotic agent status: Secondary | ICD-10-CM

## 2023-06-11 DIAGNOSIS — I472 Ventricular tachycardia, unspecified: Secondary | ICD-10-CM | POA: Diagnosis not present

## 2023-06-11 DIAGNOSIS — Z66 Do not resuscitate: Secondary | ICD-10-CM | POA: Diagnosis not present

## 2023-06-11 DIAGNOSIS — J4530 Mild persistent asthma, uncomplicated: Secondary | ICD-10-CM | POA: Diagnosis present

## 2023-06-11 DIAGNOSIS — R933 Abnormal findings on diagnostic imaging of other parts of digestive tract: Secondary | ICD-10-CM | POA: Diagnosis not present

## 2023-06-11 DIAGNOSIS — Z5181 Encounter for therapeutic drug level monitoring: Secondary | ICD-10-CM

## 2023-06-11 DIAGNOSIS — I517 Cardiomegaly: Secondary | ICD-10-CM | POA: Diagnosis not present

## 2023-06-11 DIAGNOSIS — E1122 Type 2 diabetes mellitus with diabetic chronic kidney disease: Secondary | ICD-10-CM | POA: Diagnosis not present

## 2023-06-11 DIAGNOSIS — G928 Other toxic encephalopathy: Secondary | ICD-10-CM | POA: Diagnosis not present

## 2023-06-11 DIAGNOSIS — Z96649 Presence of unspecified artificial hip joint: Secondary | ICD-10-CM

## 2023-06-11 DIAGNOSIS — J4489 Other specified chronic obstructive pulmonary disease: Secondary | ICD-10-CM | POA: Diagnosis present

## 2023-06-11 DIAGNOSIS — L89896 Pressure-induced deep tissue damage of other site: Secondary | ICD-10-CM | POA: Diagnosis present

## 2023-06-11 DIAGNOSIS — Z9841 Cataract extraction status, right eye: Secondary | ICD-10-CM

## 2023-06-11 DIAGNOSIS — Z86711 Personal history of pulmonary embolism: Secondary | ICD-10-CM

## 2023-06-11 DIAGNOSIS — R197 Diarrhea, unspecified: Secondary | ICD-10-CM | POA: Diagnosis not present

## 2023-06-11 DIAGNOSIS — K6389 Other specified diseases of intestine: Secondary | ICD-10-CM | POA: Diagnosis not present

## 2023-06-11 DIAGNOSIS — K4020 Bilateral inguinal hernia, without obstruction or gangrene, not specified as recurrent: Secondary | ICD-10-CM | POA: Diagnosis not present

## 2023-06-11 DIAGNOSIS — Z8546 Personal history of malignant neoplasm of prostate: Secondary | ICD-10-CM

## 2023-06-11 DIAGNOSIS — I130 Hypertensive heart and chronic kidney disease with heart failure and stage 1 through stage 4 chronic kidney disease, or unspecified chronic kidney disease: Secondary | ICD-10-CM | POA: Diagnosis present

## 2023-06-11 DIAGNOSIS — D62 Acute posthemorrhagic anemia: Secondary | ICD-10-CM | POA: Diagnosis not present

## 2023-06-11 DIAGNOSIS — K560 Paralytic ileus: Secondary | ICD-10-CM | POA: Diagnosis not present

## 2023-06-11 DIAGNOSIS — Z7984 Long term (current) use of oral hypoglycemic drugs: Secondary | ICD-10-CM

## 2023-06-11 DIAGNOSIS — J690 Pneumonitis due to inhalation of food and vomit: Secondary | ICD-10-CM | POA: Diagnosis not present

## 2023-06-11 DIAGNOSIS — E785 Hyperlipidemia, unspecified: Secondary | ICD-10-CM | POA: Diagnosis present

## 2023-06-11 DIAGNOSIS — R0902 Hypoxemia: Secondary | ICD-10-CM | POA: Diagnosis not present

## 2023-06-11 DIAGNOSIS — Z96643 Presence of artificial hip joint, bilateral: Secondary | ICD-10-CM | POA: Diagnosis not present

## 2023-06-11 DIAGNOSIS — Z91048 Other nonmedicinal substance allergy status: Secondary | ICD-10-CM

## 2023-06-11 DIAGNOSIS — R140 Abdominal distension (gaseous): Secondary | ICD-10-CM | POA: Diagnosis not present

## 2023-06-11 DIAGNOSIS — R188 Other ascites: Secondary | ICD-10-CM | POA: Diagnosis not present

## 2023-06-11 DIAGNOSIS — K5900 Constipation, unspecified: Secondary | ICD-10-CM | POA: Diagnosis not present

## 2023-06-11 DIAGNOSIS — R570 Cardiogenic shock: Secondary | ICD-10-CM | POA: Diagnosis not present

## 2023-06-11 DIAGNOSIS — Z96641 Presence of right artificial hip joint: Secondary | ICD-10-CM | POA: Diagnosis present

## 2023-06-11 DIAGNOSIS — Z87891 Personal history of nicotine dependence: Secondary | ICD-10-CM

## 2023-06-11 DIAGNOSIS — I2510 Atherosclerotic heart disease of native coronary artery without angina pectoris: Secondary | ICD-10-CM | POA: Diagnosis present

## 2023-06-11 DIAGNOSIS — N400 Enlarged prostate without lower urinary tract symptoms: Secondary | ICD-10-CM | POA: Diagnosis present

## 2023-06-11 DIAGNOSIS — D539 Nutritional anemia, unspecified: Secondary | ICD-10-CM | POA: Diagnosis not present

## 2023-06-11 DIAGNOSIS — Z7901 Long term (current) use of anticoagulants: Secondary | ICD-10-CM

## 2023-06-11 DIAGNOSIS — R410 Disorientation, unspecified: Secondary | ICD-10-CM | POA: Diagnosis not present

## 2023-06-11 DIAGNOSIS — I9589 Other hypotension: Secondary | ICD-10-CM | POA: Diagnosis not present

## 2023-06-11 DIAGNOSIS — Z01818 Encounter for other preprocedural examination: Secondary | ICD-10-CM

## 2023-06-11 DIAGNOSIS — Z79899 Other long term (current) drug therapy: Secondary | ICD-10-CM

## 2023-06-11 DIAGNOSIS — Z9884 Bariatric surgery status: Secondary | ICD-10-CM

## 2023-06-11 DIAGNOSIS — E8809 Other disorders of plasma-protein metabolism, not elsewhere classified: Secondary | ICD-10-CM | POA: Diagnosis present

## 2023-06-11 DIAGNOSIS — M169 Osteoarthritis of hip, unspecified: Secondary | ICD-10-CM | POA: Diagnosis present

## 2023-06-11 HISTORY — PX: TOTAL HIP ARTHROPLASTY: SHX124

## 2023-06-11 LAB — GLUCOSE, CAPILLARY
Glucose-Capillary: 110 mg/dL — ABNORMAL HIGH (ref 70–99)
Glucose-Capillary: 125 mg/dL — ABNORMAL HIGH (ref 70–99)
Glucose-Capillary: 159 mg/dL — ABNORMAL HIGH (ref 70–99)
Glucose-Capillary: 207 mg/dL — ABNORMAL HIGH (ref 70–99)

## 2023-06-11 SURGERY — ARTHROPLASTY, HIP, TOTAL, ANTERIOR APPROACH
Anesthesia: General | Site: Hip | Laterality: Left

## 2023-06-11 MED ORDER — PROPOFOL 10 MG/ML IV BOLUS
INTRAVENOUS | Status: AC
  Filled 2023-06-11: qty 20

## 2023-06-11 MED ORDER — PHENOL 1.4 % MT LIQD
1.0000 | OROMUCOSAL | Status: DC | PRN
  Administered 2023-06-18: 1 via OROMUCOSAL
  Filled 2023-06-11: qty 177

## 2023-06-11 MED ORDER — ALBUTEROL SULFATE (2.5 MG/3ML) 0.083% IN NEBU: 2.5000 mg | INHALATION_SOLUTION | RESPIRATORY_TRACT | Status: DC | PRN

## 2023-06-11 MED ORDER — WATER FOR IRRIGATION, STERILE IR SOLN
Status: DC | PRN
  Administered 2023-06-11: 1000 mL

## 2023-06-11 MED ORDER — DOCUSATE SODIUM 100 MG PO CAPS
100.0000 mg | ORAL_CAPSULE | Freq: Two times a day (BID) | ORAL | Status: DC
  Administered 2023-06-11 – 2023-06-15 (×8): 100 mg via ORAL
  Filled 2023-06-11 (×8): qty 1

## 2023-06-11 MED ORDER — ACETAMINOPHEN 325 MG PO TABS
325.0000 mg | ORAL_TABLET | Freq: Four times a day (QID) | ORAL | Status: DC | PRN
  Administered 2023-06-16: 650 mg via ORAL
  Filled 2023-06-11: qty 2

## 2023-06-11 MED ORDER — METOCLOPRAMIDE HCL 5 MG/ML IJ SOLN: 5.0000 mg | Freq: Three times a day (TID) | INTRAMUSCULAR | Status: DC | PRN

## 2023-06-11 MED ORDER — PROPOFOL 10 MG/ML IV BOLUS
INTRAVENOUS | Status: DC | PRN
  Administered 2023-06-11: 130 mg via INTRAVENOUS

## 2023-06-11 MED ORDER — HYDROMORPHONE HCL 1 MG/ML IJ SOLN
INTRAMUSCULAR | Status: AC
  Filled 2023-06-11: qty 2

## 2023-06-11 MED ORDER — CEFAZOLIN SODIUM-DEXTROSE 2-4 GM/100ML-% IV SOLN
2.0000 g | Freq: Four times a day (QID) | INTRAVENOUS | Status: AC
  Administered 2023-06-11 – 2023-06-12 (×2): 2 g via INTRAVENOUS
  Filled 2023-06-11 (×2): qty 100

## 2023-06-11 MED ORDER — LORATADINE 10 MG PO TABS
10.0000 mg | ORAL_TABLET | Freq: Every day | ORAL | Status: DC
  Administered 2023-06-12 – 2023-06-17 (×6): 10 mg via ORAL
  Filled 2023-06-11 (×7): qty 1

## 2023-06-11 MED ORDER — CHLORHEXIDINE GLUCONATE 0.12 % MT SOLN
15.0000 mL | Freq: Once | OROMUCOSAL | Status: AC
  Administered 2023-06-11: 15 mL via OROMUCOSAL

## 2023-06-11 MED ORDER — POLYETHYLENE GLYCOL 3350 17 G PO PACK
17.0000 g | PACK | Freq: Every day | ORAL | Status: DC | PRN
  Filled 2023-06-11: qty 1

## 2023-06-11 MED ORDER — OXYCODONE HCL 5 MG PO TABS
ORAL_TABLET | ORAL | Status: AC
  Filled 2023-06-11: qty 1

## 2023-06-11 MED ORDER — LIDOCAINE HCL (PF) 2 % IJ SOLN
INTRAMUSCULAR | Status: AC
  Filled 2023-06-11: qty 5

## 2023-06-11 MED ORDER — ONDANSETRON HCL 4 MG PO TABS: 4.0000 mg | ORAL_TABLET | Freq: Four times a day (QID) | ORAL | Status: DC | PRN

## 2023-06-11 MED ORDER — EPHEDRINE SULFATE-NACL 50-0.9 MG/10ML-% IV SOSY
PREFILLED_SYRINGE | INTRAVENOUS | Status: DC | PRN
  Administered 2023-06-11: 10 mg via INTRAVENOUS

## 2023-06-11 MED ORDER — FLUTICASONE FUROATE-VILANTEROL 200-25 MCG/ACT IN AEPB
1.0000 | INHALATION_SPRAY | Freq: Every day | RESPIRATORY_TRACT | Status: DC
  Filled 2023-06-11: qty 28

## 2023-06-11 MED ORDER — MAGNESIUM CITRATE PO SOLN: 1.0000 | Freq: Once | ORAL | Status: DC | PRN

## 2023-06-11 MED ORDER — BUPIVACAINE-EPINEPHRINE (PF) 0.25% -1:200000 IJ SOLN
INTRAMUSCULAR | Status: DC | PRN
  Administered 2023-06-11: 30 mL

## 2023-06-11 MED ORDER — METOCLOPRAMIDE HCL 5 MG PO TABS
5.0000 mg | ORAL_TABLET | Freq: Three times a day (TID) | ORAL | Status: DC | PRN
  Administered 2023-06-16 – 2023-06-17 (×2): 10 mg via ORAL
  Filled 2023-06-11 (×2): qty 2

## 2023-06-11 MED ORDER — TIOTROPIUM BROMIDE MONOHYDRATE 2.5 MCG/ACT IN AERS: 2.0000 | INHALATION_SPRAY | Freq: Every day | RESPIRATORY_TRACT | Status: DC | PRN

## 2023-06-11 MED ORDER — HYDROMORPHONE HCL 2 MG/ML IJ SOLN
INTRAMUSCULAR | Status: AC
Start: 2023-06-11 — End: ?
  Filled 2023-06-11: qty 1

## 2023-06-11 MED ORDER — FENTANYL CITRATE (PF) 100 MCG/2ML IJ SOLN
INTRAMUSCULAR | Status: AC
  Filled 2023-06-11: qty 2

## 2023-06-11 MED ORDER — CYCLOBENZAPRINE HCL 10 MG PO TABS
10.0000 mg | ORAL_TABLET | Freq: Three times a day (TID) | ORAL | Status: DC | PRN
  Administered 2023-06-12 – 2023-06-18 (×8): 10 mg via ORAL
  Filled 2023-06-11 (×9): qty 1

## 2023-06-11 MED ORDER — ROCURONIUM BROMIDE 10 MG/ML (PF) SYRINGE
PREFILLED_SYRINGE | INTRAVENOUS | Status: AC
  Filled 2023-06-11: qty 10

## 2023-06-11 MED ORDER — LIDOCAINE HCL (CARDIAC) PF 100 MG/5ML IV SOSY
PREFILLED_SYRINGE | INTRAVENOUS | Status: DC | PRN
  Administered 2023-06-11: 100 mg via INTRAVENOUS

## 2023-06-11 MED ORDER — TRANEXAMIC ACID 1000 MG/10ML IV SOLN
INTRAVENOUS | Status: DC | PRN
  Administered 2023-06-11: 2000 mg via TOPICAL

## 2023-06-11 MED ORDER — UMECLIDINIUM BROMIDE 62.5 MCG/ACT IN AEPB
1.0000 | INHALATION_SPRAY | Freq: Every day | RESPIRATORY_TRACT | Status: DC
  Filled 2023-06-11: qty 7

## 2023-06-11 MED ORDER — PHENYLEPHRINE HCL (PRESSORS) 10 MG/ML IV SOLN
INTRAVENOUS | Status: DC | PRN
  Administered 2023-06-11: 80 ug via INTRAVENOUS
  Administered 2023-06-11: 100 ug via INTRAVENOUS
  Administered 2023-06-11: 80 ug via INTRAVENOUS
  Administered 2023-06-11: 120 ug via INTRAVENOUS

## 2023-06-11 MED ORDER — MENTHOL 3 MG MT LOZG
1.0000 | LOZENGE | OROMUCOSAL | Status: DC | PRN
  Administered 2023-06-16: 3 mg via ORAL
  Filled 2023-06-11 (×2): qty 9

## 2023-06-11 MED ORDER — FENTANYL CITRATE (PF) 100 MCG/2ML IJ SOLN
INTRAMUSCULAR | Status: DC | PRN
  Administered 2023-06-11: 100 ug via INTRAVENOUS

## 2023-06-11 MED ORDER — INSULIN ASPART 100 UNIT/ML IJ SOLN: 0.0000 [IU] | INTRAMUSCULAR | Status: DC | PRN

## 2023-06-11 MED ORDER — DEXAMETHASONE SODIUM PHOSPHATE 10 MG/ML IJ SOLN
8.0000 mg | Freq: Once | INTRAMUSCULAR | Status: AC
  Administered 2023-06-11: 8 mg via INTRAVENOUS

## 2023-06-11 MED ORDER — ONDANSETRON HCL 4 MG/2ML IJ SOLN
INTRAMUSCULAR | Status: DC | PRN
  Administered 2023-06-11: 4 mg via INTRAVENOUS

## 2023-06-11 MED ORDER — HYDROMORPHONE HCL 1 MG/ML IJ SOLN
0.5000 mg | INTRAMUSCULAR | Status: AC | PRN
  Administered 2023-06-11 (×4): 0.5 mg via INTRAVENOUS

## 2023-06-11 MED ORDER — LOSARTAN POTASSIUM 25 MG PO TABS
25.0000 mg | ORAL_TABLET | Freq: Every day | ORAL | Status: DC
  Administered 2023-06-13: 25 mg via ORAL
  Filled 2023-06-11 (×2): qty 1

## 2023-06-11 MED ORDER — BISACODYL 10 MG RE SUPP: 10.0000 mg | Freq: Every day | RECTAL | Status: DC | PRN

## 2023-06-11 MED ORDER — ONDANSETRON HCL 4 MG/2ML IJ SOLN: 4.0000 mg | Freq: Four times a day (QID) | INTRAMUSCULAR | Status: DC | PRN

## 2023-06-11 MED ORDER — PHENYLEPHRINE HCL-NACL 20-0.9 MG/250ML-% IV SOLN
INTRAVENOUS | Status: DC | PRN
  Administered 2023-06-11: 20 ug/min via INTRAVENOUS

## 2023-06-11 MED ORDER — TOPIRAMATE 25 MG PO TABS
50.0000 mg | ORAL_TABLET | Freq: Two times a day (BID) | ORAL | Status: DC
  Administered 2023-06-12 – 2023-06-17 (×12): 50 mg via ORAL
  Filled 2023-06-11 (×13): qty 2

## 2023-06-11 MED ORDER — TRANEXAMIC ACID 1000 MG/10ML IV SOLN
2000.0000 mg | Freq: Once | INTRAVENOUS | Status: DC
  Filled 2023-06-11: qty 20

## 2023-06-11 MED ORDER — PANTOPRAZOLE SODIUM 40 MG PO TBEC
40.0000 mg | DELAYED_RELEASE_TABLET | Freq: Every day | ORAL | Status: DC
  Administered 2023-06-11 – 2023-06-17 (×7): 40 mg via ORAL
  Filled 2023-06-11 (×8): qty 1

## 2023-06-11 MED ORDER — NITROGLYCERIN 0.4 MG SL SUBL: 0.4000 mg | SUBLINGUAL_TABLET | SUBLINGUAL | Status: DC | PRN

## 2023-06-11 MED ORDER — RIVAROXABAN 10 MG PO TABS
10.0000 mg | ORAL_TABLET | Freq: Every day | ORAL | Status: DC
  Administered 2023-06-12 – 2023-06-17 (×6): 10 mg via ORAL
  Filled 2023-06-11 (×8): qty 1

## 2023-06-11 MED ORDER — ORAL CARE MOUTH RINSE: 15.0000 mL | Freq: Once | OROMUCOSAL | Status: AC

## 2023-06-11 MED ORDER — HYDROMORPHONE HCL 1 MG/ML IJ SOLN: 0.2500 mg | INTRAMUSCULAR | Status: DC | PRN

## 2023-06-11 MED ORDER — ROCURONIUM BROMIDE 100 MG/10ML IV SOLN
INTRAVENOUS | Status: DC | PRN
  Administered 2023-06-11: 60 mg via INTRAVENOUS

## 2023-06-11 MED ORDER — 0.9 % SODIUM CHLORIDE (POUR BTL) OPTIME
TOPICAL | Status: DC | PRN
  Administered 2023-06-11: 1000 mL

## 2023-06-11 MED ORDER — HYDROCODONE-ACETAMINOPHEN 7.5-325 MG PO TABS
1.0000 | ORAL_TABLET | ORAL | Status: DC | PRN
  Administered 2023-06-12 (×2): 1 via ORAL
  Administered 2023-06-13 – 2023-06-17 (×10): 2 via ORAL
  Administered 2023-06-18: 1 via ORAL
  Filled 2023-06-11: qty 1
  Filled 2023-06-11 (×8): qty 2
  Filled 2023-06-11 (×2): qty 1
  Filled 2023-06-11 (×2): qty 2

## 2023-06-11 MED ORDER — HYDROMORPHONE HCL 1 MG/ML IJ SOLN
INTRAMUSCULAR | Status: DC | PRN
  Administered 2023-06-11: .2 mg via INTRAVENOUS
  Administered 2023-06-11: .4 mg via INTRAVENOUS
  Administered 2023-06-11: .2 mg via INTRAVENOUS

## 2023-06-11 MED ORDER — ACETAMINOPHEN 10 MG/ML IV SOLN
1000.0000 mg | Freq: Four times a day (QID) | INTRAVENOUS | Status: DC
  Administered 2023-06-11: 1000 mg via INTRAVENOUS
  Filled 2023-06-11: qty 100

## 2023-06-11 MED ORDER — SUGAMMADEX SODIUM 200 MG/2ML IV SOLN
INTRAVENOUS | Status: DC | PRN
  Administered 2023-06-11: 250 mg via INTRAVENOUS

## 2023-06-11 MED ORDER — POTASSIUM CHLORIDE CRYS ER 20 MEQ PO TBCR
20.0000 meq | EXTENDED_RELEASE_TABLET | Freq: Every evening | ORAL | Status: DC
  Administered 2023-06-11 – 2023-06-17 (×7): 20 meq via ORAL
  Filled 2023-06-11 (×7): qty 1

## 2023-06-11 MED ORDER — POVIDONE-IODINE 10 % EX SWAB: 2.0000 | Freq: Once | CUTANEOUS | Status: DC

## 2023-06-11 MED ORDER — CEFAZOLIN SODIUM-DEXTROSE 2-4 GM/100ML-% IV SOLN
2.0000 g | INTRAVENOUS | Status: AC
  Administered 2023-06-11: 2 g via INTRAVENOUS
  Filled 2023-06-11: qty 100

## 2023-06-11 MED ORDER — DROPERIDOL 2.5 MG/ML IJ SOLN: 0.6250 mg | Freq: Once | INTRAMUSCULAR | Status: DC | PRN

## 2023-06-11 MED ORDER — SODIUM CHLORIDE 0.9 % IV SOLN: INTRAVENOUS | Status: DC

## 2023-06-11 MED ORDER — OXYCODONE HCL 5 MG/5ML PO SOLN: 5.0000 mg | Freq: Once | ORAL | Status: AC | PRN

## 2023-06-11 MED ORDER — BUPIVACAINE-EPINEPHRINE (PF) 0.25% -1:200000 IJ SOLN
INTRAMUSCULAR | Status: AC
  Filled 2023-06-11: qty 30

## 2023-06-11 MED ORDER — SPIRONOLACTONE 25 MG PO TABS
25.0000 mg | ORAL_TABLET | Freq: Every day | ORAL | Status: DC
  Administered 2023-06-12 – 2023-06-14 (×3): 25 mg via ORAL
  Filled 2023-06-11 (×3): qty 1

## 2023-06-11 MED ORDER — HYDROMORPHONE HCL 1 MG/ML IJ SOLN
0.5000 mg | INTRAMUSCULAR | Status: DC | PRN
  Administered 2023-06-16 – 2023-06-25 (×4): 1 mg via INTRAVENOUS
  Filled 2023-06-11 (×4): qty 1

## 2023-06-11 MED ORDER — HYDROCODONE-ACETAMINOPHEN 5-325 MG PO TABS
1.0000 | ORAL_TABLET | ORAL | Status: DC | PRN
  Administered 2023-06-15: 2 via ORAL
  Administered 2023-06-16 – 2023-06-17 (×2): 1 via ORAL
  Filled 2023-06-11: qty 2
  Filled 2023-06-11: qty 1
  Filled 2023-06-11: qty 2

## 2023-06-11 MED ORDER — OXYCODONE HCL 5 MG PO TABS
5.0000 mg | ORAL_TABLET | Freq: Once | ORAL | Status: AC | PRN
  Administered 2023-06-11: 5 mg via ORAL

## 2023-06-11 MED ORDER — POTASSIUM CHLORIDE CRYS ER 20 MEQ PO TBCR
40.0000 meq | EXTENDED_RELEASE_TABLET | Freq: Every day | ORAL | Status: DC
  Administered 2023-06-12 – 2023-06-17 (×6): 40 meq via ORAL
  Filled 2023-06-11 (×7): qty 2

## 2023-06-11 MED ORDER — LACTATED RINGERS IV SOLN: INTRAVENOUS | Status: DC

## 2023-06-11 MED ORDER — TAMSULOSIN HCL 0.4 MG PO CAPS
0.4000 mg | ORAL_CAPSULE | Freq: Every evening | ORAL | Status: DC
  Administered 2023-06-11 – 2023-06-24 (×10): 0.4 mg via ORAL
  Filled 2023-06-11 (×10): qty 1

## 2023-06-11 MED ORDER — ACETAMINOPHEN 500 MG PO TABS
500.0000 mg | ORAL_TABLET | Freq: Four times a day (QID) | ORAL | Status: AC
  Administered 2023-06-11 – 2023-06-12 (×4): 500 mg via ORAL
  Filled 2023-06-11 (×4): qty 1

## 2023-06-11 MED ORDER — EMPAGLIFLOZIN 25 MG PO TABS
25.0000 mg | ORAL_TABLET | Freq: Every day | ORAL | Status: DC
Start: 2023-06-12 — End: 2023-06-15
  Administered 2023-06-12 – 2023-06-14 (×3): 25 mg via ORAL
  Filled 2023-06-11 (×4): qty 1

## 2023-06-11 MED ORDER — INSULIN ASPART 100 UNIT/ML IJ SOLN
0.0000 [IU] | Freq: Three times a day (TID) | INTRAMUSCULAR | Status: DC
  Administered 2023-06-12 – 2023-06-13 (×5): 3 [IU] via SUBCUTANEOUS
  Administered 2023-06-13 – 2023-06-14 (×2): 2 [IU] via SUBCUTANEOUS
  Administered 2023-06-14: 3 [IU] via SUBCUTANEOUS
  Administered 2023-06-14: 2 [IU] via SUBCUTANEOUS
  Administered 2023-06-15: 5 [IU] via SUBCUTANEOUS
  Administered 2023-06-15: 3 [IU] via SUBCUTANEOUS
  Administered 2023-06-15 – 2023-06-16 (×3): 2 [IU] via SUBCUTANEOUS
  Administered 2023-06-16: 3 [IU] via SUBCUTANEOUS
  Administered 2023-06-17: 2 [IU] via SUBCUTANEOUS
  Administered 2023-06-17: 3 [IU] via SUBCUTANEOUS
  Administered 2023-06-18: 2 [IU] via SUBCUTANEOUS
  Administered 2023-06-18: 3 [IU] via SUBCUTANEOUS

## 2023-06-11 SURGICAL SUPPLY — 35 items
BAG COUNTER SPONGE SURGICOUNT (BAG) IMPLANT
BAG ZIPLOCK 12X15 (MISCELLANEOUS) IMPLANT
BLADE SAG 18X100X1.27 (BLADE) ×1 IMPLANT
COVER PERINEAL POST (MISCELLANEOUS) ×1 IMPLANT
COVER SURGICAL LIGHT HANDLE (MISCELLANEOUS) ×1 IMPLANT
CUP ACETBLR 54 OD PINNACLE (Hips) IMPLANT
DERMABOND ADVANCED .7 DNX12 (GAUZE/BANDAGES/DRESSINGS) ×1 IMPLANT
DRAPE FOOT SWITCH (DRAPES) ×1 IMPLANT
DRAPE STERI IOBAN 125X83 (DRAPES) ×1 IMPLANT
DRAPE U-SHAPE 47X51 STRL (DRAPES) ×2 IMPLANT
DRSG AQUACEL AG ADV 3.5X10 (GAUZE/BANDAGES/DRESSINGS) ×1 IMPLANT
DURAPREP 26ML APPLICATOR (WOUND CARE) ×1 IMPLANT
ELECT REM PT RETURN 15FT ADLT (MISCELLANEOUS) ×1 IMPLANT
GLOVE BIO SURGEON STRL SZ 6.5 (GLOVE) IMPLANT
GLOVE BIO SURGEON STRL SZ7 (GLOVE) IMPLANT
GLOVE BIO SURGEON STRL SZ8 (GLOVE) ×1 IMPLANT
GLOVE BIOGEL PI IND STRL 7.0 (GLOVE) IMPLANT
GLOVE BIOGEL PI IND STRL 8 (GLOVE) ×1 IMPLANT
GOWN STRL REUS W/ TWL LRG LVL3 (GOWN DISPOSABLE) ×2 IMPLANT
HEAD M SROM 36MM PLUS 1.5 (Hips) IMPLANT
HOLDER FOLEY CATH W/STRAP (MISCELLANEOUS) ×1 IMPLANT
KIT TURNOVER KIT A (KITS) IMPLANT
LINER NEUTRAL 54X36MM PLUS 4 (Hips) IMPLANT
MANIFOLD NEPTUNE II (INSTRUMENTS) ×1 IMPLANT
PACK ANTERIOR HIP CUSTOM (KITS) ×1 IMPLANT
PENCIL SMOKE EVACUATOR COATED (MISCELLANEOUS) ×1 IMPLANT
SPIKE FLUID TRANSFER (MISCELLANEOUS) ×1 IMPLANT
STEM FEM ACTIS STD SZ7 (Nail) IMPLANT
SUT ETHIBOND NAB CT1 #1 30IN (SUTURE) ×1 IMPLANT
SUT MNCRL AB 4-0 PS2 18 (SUTURE) ×1 IMPLANT
SUT VIC AB 2-0 CT1 TAPERPNT 27 (SUTURE) ×2 IMPLANT
SUTURE STRATFX 0 PDS 27 VIOLET (SUTURE) ×1 IMPLANT
TOWEL GREEN STERILE FF (TOWEL DISPOSABLE) ×1 IMPLANT
TRAY FOLEY MTR SLVR 16FR STAT (SET/KITS/TRAYS/PACK) ×1 IMPLANT
TUBE SUCTION HIGH CAP CLEAR NV (SUCTIONS) ×1 IMPLANT

## 2023-06-11 NOTE — Op Note (Signed)
 OPERATIVE REPORT- TOTAL HIP ARTHROPLASTY   PREOPERATIVE DIAGNOSIS: Osteoarthritis of the Left hip.   POSTOPERATIVE DIAGNOSIS: Osteoarthritis of the Left  hip.   PROCEDURE: Left total hip arthroplasty, anterior approach.   SURGEON: Liliane Rei, MD   ASSISTANT: Brinton Canavan, PA-C  ANESTHESIA:  General  ESTIMATED BLOOD LOSS:-900 mL    DRAINS: None  COMPLICATIONS: None   CONDITION: PACU - hemodynamically stable.   BRIEF CLINICAL NOTE: Carlos Dawson is a 83 y.o. male who has advanced end-  stage arthritis of their Left  hip with progressively worsening pain and  dysfunction.The patient has failed nonoperative management and presents for  total hip arthroplasty.   PROCEDURE IN DETAIL: After successful administration of spinal  anesthetic, the traction boots for the [facility] bed were placed on both  feet and the patient was placed onto the [facility] bed, boots placed into the leg  holders. The Left hip was then isolated from the perineum with plastic  drapes and prepped and draped in the usual sterile fashion. ASIS and  greater trochanter were marked and a oblique incision was made, starting  at about 1 cm lateral and 2 cm distal to the ASIS and coursing towards  the anterior cortex of the femur. The skin was cut with a 10 blade  through subcutaneous tissue to the level of the fascia overlying the  tensor fascia lata muscle. The fascia was then incised in line with the  incision at the junction of the anterior third and posterior 2/3rd. The  muscle was teased off the fascia and then the interval between the TFL  and the rectus was developed. The Hohmann retractor was then placed at  the top of the femoral neck over the capsule. The vessels overlying the  capsule were cauterized and the fat on top of the capsule was removed.  A Hohmann retractor was then placed anterior underneath the rectus  femoris to give exposure to the entire anterior capsule. A T-shaped  capsulotomy  was performed. The edges were tagged and the femoral head  was identified.       Osteophytes are removed off the superior acetabulum.  The femoral neck was then cut in situ with an oscillating saw. Traction  was then applied to the left lower extremity utilizing the [facility]  traction. The femoral head was then removed. Retractors were placed  around the acetabulum and then circumferential removal of the labrum was  performed. Osteophytes were also removed. Reaming starts at 51 mm to  medialize and  Increased in 2 mm increments to 53 mm. We reamed in  approximately 40 degrees of abduction, 20 degrees anteversion. A 54 mm  pinnacle acetabular shell was then impacted in anatomic position under  fluoroscopic guidance with excellent purchase. We did not need to place  any additional dome screws. A 36 mm neutral + 4 marathon liner was then  placed into the acetabular shell.       The femoral lift was then placed along the lateral aspect of the femur  just distal to the vastus ridge. The leg was  externally rotated and capsule  was stripped off the inferior aspect of the femoral neck down to the  level of the lesser trochanter, this was done with electrocautery. The femur was lifted after this was performed. The  leg was then placed in an extended and adducted position essentially delivering the femur. We also removed the capsule superiorly and the piriformis from the piriformis fossa to  gain excellent exposure of the  proximal femur. Rongeur was used to remove some cancellous bone to get  into the lateral portion of the proximal femur for placement of the  initial starter reamer. The starter broaches was placed  the starter broach  and was shown to go down the center of the canal. Broaching  with the Actis system was then performed starting at size 0  coursing  Up to size 7. A size 7 had excellent torsional and rotational  and axial stability. The trial standard offset neck was then placed  with a 36  + 1.5 trial head. The hip was then reduced. We confirmed that  the stem was in the canal both on AP and lateral x-rays. It also has excellent sizing. The hip was reduced with outstanding stability through full extension and full external rotation.. AP pelvis was taken and the leg lengths were measured and found to be equal. Hip was then dislocated again and the femoral head and neck removed. The  femoral broach was removed. Size 7 Actis stem with a standard offset  neck was then impacted into the femur following native anteversion. Has  excellent purchase in the canal. Excellent torsional and rotational and  axial stability. It is confirmed to be in the canal on AP and lateral  fluoroscopic views. The 36 + 1.5 metal head was placed and the hip  reduced with outstanding stability. Again AP pelvis was taken and it  confirmed that the leg lengths were equal. The wound was then copiously  irrigated with saline solution and the capsule reattached and repaired  with Ethibond suture. 30 ml of .25% Bupivicaine was  injected into the capsule and into the edge of the tensor fascia lata as well as subcutaneous tissue. The fascia overlying the tensor fascia lata was then closed with a running #1 V-Loc. Subcu was closed with interrupted 2-0 Vicryl and subcuticular running 4-0 Monocryl. Incision was cleaned  and dried. Steri-Strips and a bulky sterile dressing applied. The patient was awakened and transported to  recovery in stable condition.        Please note that a surgical assistant was a medical necessity for this procedure to perform it in a safe and expeditious manner. Assistant was necessary to provide appropriate retraction of vital neurovascular structures and to prevent femoral fracture and allow for anatomic placement of the prosthesis.  Liliane Rei, M.D.

## 2023-06-11 NOTE — Discharge Instructions (Addendum)
Carlos Aluisio, MD Total Joint Specialist [facility] [address] [place], [place] [address] [phone]  ANTERIOR APPROACH TOTAL HIP REPLACEMENT POSTOPERATIVE DIRECTIONS     Hip Rehabilitation, Guidelines Following Surgery  The results of a hip operation are greatly improved after range of motion and muscle strengthening exercises. Follow all safety measures which are given to protect your hip. If any of these exercises cause increased pain or swelling in your joint, decrease the amount until you are comfortable again. Then slowly increase the exercises. Call your caregiver if you have problems or questions.   BLOOD CLOT PREVENTION Take a 10 mg Xarelto once a day for three weeks following surgery. Then take an 81 mg Aspirin once a day for three weeks. Then discontinue Aspirin. You may resume your vitamins/supplements once you have discontinued the Xarelto. Do not take any NSAIDs (Advil, Aleve, Ibuprofen, Meloxicam, etc.) until you have discontinued the Xarelto.   HOME CARE INSTRUCTIONS  Remove items at home which could result in a fall. This includes throw rugs or furniture in walking pathways.  ICE to the affected hip as frequently as 20-30 minutes an hour and then as needed for pain and swelling. Continue to use ice on the hip for pain and swelling from surgery. You may notice swelling that will progress down to the foot and ankle. This is normal after surgery. Elevate the leg when you are not up walking on it.   Continue to use the breathing machine which will help keep your temperature down.  It is common for your temperature to cycle up and down following surgery, especially at night when you are not up moving around and exerting yourself.  The breathing machine keeps your lungs expanded and your temperature down.  DIET You may resume your previous home diet once your are discharged from the hospital.  DRESSING / WOUND CARE / SHOWERING You have an  adhesive waterproof bandage over the incision. Leave this in place until your first follow-up appointment. Once you remove this you will not need to place another bandage.  You may begin showering 3 days following surgery, but do not submerge the incision under water.  ACTIVITY For the first 3-5 days, it is important to rest and keep the operative leg elevated. You should, as a general rule, rest for 50 minutes and walk/stretch for 10 minutes per hour. After 5 days, you may slowly increase activity as tolerated.  Perform the exercises you were provided twice a day for about 15-20 minutes each session. Begin these 2 days following surgery. Walk with your walker as instructed. Use the walker until you are comfortable transitioning to a cane. Walk with the cane in the opposite hand of the operative leg. You may discontinue the cane once you are comfortable and walking steadily. Avoid periods of inactivity such as sitting longer than an hour when not asleep. This helps prevent blood clots.  Do not drive a car for 6 weeks or until released by your surgeon.  Do not drive while taking narcotics.  TED HOSE STOCKINGS Wear the elastic stockings on both legs for three weeks following surgery during the day. You may remove them at night while sleeping.  WEIGHT BEARING Weight bearing as tolerated with assist device (walker, cane, etc) as directed, use it as long as suggested by your surgeon or therapist, typically at least 4-6 weeks.  POSTOPERATIVE CONSTIPATION PROTOCOL Constipation - defined medically as fewer than three stools per week and severe constipation as less   less than one stool per week. ° °One of the most common issues patients have following surgery is constipation.  Even if you have a regular bowel pattern at home, your normal regimen is likely to be disrupted due to multiple reasons following surgery.  Combination of anesthesia, postoperative narcotics, change in appetite and fluid intake all can  affect your bowels.  In order to avoid complications following surgery, here are some recommendations in order to help you during your recovery period. ° °Colace (docusate) - Pick up an over-the-counter form of Colace or another stool softener and take twice a day as long as you are requiring postoperative pain medications.  Take with a full glass of water daily.  If you experience loose stools or diarrhea, hold the colace until you stool forms back up.  If your symptoms do not get better within 1 week or if they get worse, check with your doctor. °Dulcolax (bisacodyl) - Pick up over-the-counter and take as directed by the product packaging as needed to assist with the movement of your bowels.  Take with a full glass of water.  Use this product as needed if not relieved by Colace only.  °MiraLax (polyethylene glycol) - Pick up over-the-counter to have on hand.  MiraLax is a solution that will increase the amount of water in your bowels to assist with bowel movements.  Take as directed and can mix with a glass of water, juice, soda, coffee, or tea.  Take if you go more than two days without a movement.Do not use MiraLax more than once per day. Call your doctor if you are still constipated or irregular after using this medication for 7 days in a row. ° °If you continue to have problems with postoperative constipation, please contact the office for further assistance and recommendations.  If you experience "the worst abdominal pain ever" or develop nausea or vomiting, please contact the office immediatly for further recommendations for treatment. ° °ITCHING ° If you experience itching with your medications, try taking only a single pain pill, or even half a pain pill at a time.  You can also use Benadryl over the counter for itching or also to help with sleep.  ° °MEDICATIONS °See your medication summary on the “After Visit Summary” that the nursing staff will review with you prior to discharge.  You may have some home  medications which will be placed on hold until you complete the course of blood thinner medication.  It is important for you to complete the blood thinner medication as prescribed by your surgeon.  Continue your approved medications as instructed at time of discharge. ° °PRECAUTIONS °If you experience chest pain or shortness of breath - call 911 immediately for transfer to the hospital emergency department.  °If you develop a fever greater that 101 F, purulent drainage from wound, increased redness or drainage from wound, foul odor from the wound/dressing, or calf pain - CONTACT YOUR SURGEON.   °                                                °FOLLOW-UP APPOINTMENTS °Make sure you keep all of your appointments after your operation with your surgeon and caregivers. You should call the office at the above phone number and make an appointment for approximately two weeks after the date of your surgery or on the   date instructed by your surgeon outlined in the "After Visit Summary". ° °RANGE OF MOTION AND STRENGTHENING EXERCISES  °These exercises are designed to help you keep full movement of your hip joint. Follow your caregiver's or physical therapist's instructions. Perform all exercises about fifteen times, three times per day or as directed. Exercise both hips, even if you have had only one joint replacement. These exercises can be done on a training (exercise) mat, on the floor, on a table or on a bed. Use whatever works the best and is most comfortable for you. Use music or television while you are exercising so that the exercises are a pleasant break in your day. This will make your life better with the exercises acting as a break in routine you can look forward to.  °Lying on your back, slowly slide your foot toward your buttocks, raising your knee up off the floor. Then slowly slide your foot back down until your leg is straight again.  °Lying on your back spread your legs as far apart as you can without causing  discomfort.  °Lying on your side, raise your upper leg and foot straight up from the floor as far as is comfortable. Slowly lower the leg and repeat.  °Lying on your back, tighten up the muscle in the front of your thigh (quadriceps muscles). You can do this by keeping your leg straight and trying to raise your heel off the floor. This helps strengthen the largest muscle supporting your knee.  °Lying on your back, tighten up the muscles of your buttocks both with the legs straight and with the knee bent at a comfortable angle while keeping your heel on the floor.  ° °POST-OPERATIVE OPIOID TAPER INSTRUCTIONS: °It is important to wean off of your opioid medication as soon as possible. If you do not need pain medication after your surgery it is ok to stop day one. °Opioids include: °Codeine, Hydrocodone(Norco, Vicodin), Oxycodone(Percocet, oxycontin) and hydromorphone amongst others.  °Long term and even short term use of opiods can cause: °Increased pain response °Dependence °Constipation °Depression °Respiratory depression °And more.  °Withdrawal symptoms can include °Flu like symptoms °Nausea, vomiting °And more °Techniques to manage these symptoms °Hydrate well °Eat regular healthy meals °Stay active °Use relaxation techniques(deep breathing, meditating, yoga) °Do Not substitute Alcohol to help with tapering °If you have been on opioids for less than two weeks and do not have pain than it is ok to stop all together.  °Plan to wean off of opioids °This plan should start within one week post op of your joint replacement. °Maintain the same interval or time between taking each dose and first decrease the dose.  °Cut the total daily intake of opioids by one tablet each day °Next start to increase the time between doses. °The last dose that should be eliminated is the evening dose.  ° °IF YOU ARE TRANSFERRED TO A SKILLED REHAB FACILITY °If the patient is transferred to a skilled rehab facility following release from the  hospital, a list of the current medications will be sent to the facility for the patient to continue.  When discharged from the skilled rehab facility, please have the facility set up the patient's Home Health Physical Therapy prior to being released. Also, the skilled facility will be responsible for providing the patient with their medications at time of release from the facility to include their pain medication, the muscle relaxants, and their blood thinner medication. If the patient is still at the rehab facility   time of the two week follow up appointment, the skilled rehab facility will also need to assist the patient in arranging follow up appointment in our office and any transportation needs.  MAKE SURE YOU:  Understand these instructions.  Get help right away if you are not doing well or get worse.    DENTAL ANTIBIOTICS:  In most cases prophylactic antibiotics for Dental procdeures after total joint surgery are not necessary.  Exceptions are as follows:  1. History of prior total joint infection  2. Severely immunocompromised (Organ Transplant, cancer chemotherapy, Rheumatoid biologic meds such as Humera)  3. Poorly controlled diabetes (A1C &gt; 8.0, blood glucose over 200)  If you have one of these conditions, contact your surgeon for an antibiotic prescription, prior to your dental procedure.    Pick up stool softner and laxative for home use following surgery while on pain medications. Do not submerge incision under water. Please use good hand washing techniques while changing dressing each day. May shower starting three days after surgery. Please use a clean towel to pat the incision dry following showers. Continue to use ice for pain and swelling after surgery. Do not use any lotions or creams on the incision until instructed by your surgeon. ________________________________________________________  Information on my medicine - XARELTO (Rivaroxaban)  This  medication education was reviewed with me or my healthcare representative as part of my discharge preparation.    Why was Xarelto prescribed for you? Xarelto was prescribed for you to reduce the risk of blood clots forming after orthopedic surgery. The medical term for these abnormal blood clots is venous thromboembolism (VTE).  What do you need to know about xarelto ? Take your Xarelto ONCE DAILY at the same time every day. You may take it either with or without food.  If you have difficulty swallowing the tablet whole, you may crush it and mix in applesauce just prior to taking your dose.  Take Xarelto exactly as prescribed by your doctor and DO NOT stop taking Xarelto without talking to the doctor who prescribed the medication.  Stopping without other VTE prevention medication to take the place of Xarelto may increase your risk of developing a clot.  After discharge, you should have regular check-up appointments with your healthcare provider that is prescribing your Xarelto.    What do you do if you miss a dose? If you miss a dose, take it as soon as you remember on the same day then continue your regularly scheduled once daily regimen the next day. Do not take two doses of Xarelto on the same day.   Important Safety Information A possible side effect of Xarelto is bleeding. You should call your healthcare provider right away if you experience any of the following: Bleeding from an injury or your nose that does not stop. Unusual colored urine (red or dark brown) or unusual colored stools (red or black). Unusual bruising for unknown reasons. A serious fall or if you hit your head (even if there is no bleeding).  Some medicines may interact with Xarelto and might increase your risk of bleeding while on Xarelto. To help avoid this, consult your healthcare provider or pharmacist prior to using any new prescription or non-prescription medications, including herbals, vitamins,  non-steroidal anti-inflammatory drugs (NSAIDs) and supplements.  This website has more information on Xarelto: www.xarelto.com.   

## 2023-06-11 NOTE — Progress Notes (Signed)
 Pt requesting to take his home med lasix .  Med is not ordered.  I paged the on call PA Odilia Bennett, she spoke to the patients wife and explained why they don't order it on the day of surgery and told her she will order it for tomorrow morning

## 2023-06-11 NOTE — Care Plan (Signed)
 Ortho Bundle Case Management Note  Patient Details  Name: Carlos Dawson MRN: [id number] Date of Birth: 09-17-1940  LT THA on 06/11/23  DCP: Home with wife DME: No needs, has RW and cane PT: HEP                   DME Arranged:  N/A DME Agency:  NA  HH Arranged:    HH Agency:     Additional Comments: Please contact me with any questions of if this plan should need to change.  Kathlene Paradise, Case Manager [facility] [phone] Ext [phone]   06/11/2023, 10:34 AM

## 2023-06-11 NOTE — Anesthesia Preprocedure Evaluation (Addendum)
 Anesthesia Evaluation  Patient identified by MRN, date of birth, ID band Patient awake    Reviewed: Allergy & Precautions, NPO status , Patient's Chart, lab work & pertinent test results  History of Anesthesia Complications Negative for: history of anesthetic complications  Airway Mallampati: III  TM Distance: >3 FB Neck ROM: Limited    Dental  (+) Dental Advisory Given, Teeth Intact   Pulmonary shortness of breath and with exertion, asthma , sleep apnea and Continuous Positive Airway Pressure Ventilation , pneumonia, COPD,  COPD inhaler, former smoker, PE   breath sounds clear to auscultation       Cardiovascular hypertension, Pt. on medications (-) angina + CAD, + Cardiac Stents, + Peripheral Vascular Disease, +CHF, + DOE and + DVT  + dysrhythmias Atrial Fibrillation + Valvular Problems/Murmurs AS  Rhythm:Irregular Rate:Normal  Echo 10/2022  1. Left ventricular ejection fraction, by estimation, is 60 to 65%. The left ventricle has normal function. The left ventricle has no regional wall motion abnormalities. There is mild concentric left ventricular hypertrophy. Left ventricular diastolic parameters are consistent with Grade I diastolic dysfunction (impaired relaxation). Elevated left atrial pressure. The average left ventricular global longitudinal strain is -16.7 %. The global longitudinal strain is abnormal.   2. Right ventricular systolic function is normal. The right ventricular size is normal.   3. Left atrial size was mildly dilated.   4. The mitral valve is normal in structure. Trivial mitral valve regurgitation. No evidence of mitral stenosis.   5. The aortic valve is bicuspid. Aortic valve regurgitation is not visualized. Mild aortic valve stenosis.   6. Aortic dilatation noted. There is moderate dilatation of the aortic root, measuring 47 mm. There is severe dilatation of the ascending aorta, measuring 52 mm.   7. The  inferior vena cava is normal in size with greater than 50% respiratory variability, suggesting right atrial pressure of 3 mmHg.   Comparison(s): EF 60%, mild LVH, RVSP 47.2 mmHg, bicuspid AOV, mean 13.0,  peak 23.7 mmHg, AOV 47mm, asc aor 47 mm, minimal coarctation seen.    03/2022 ECHO: EF 60-65%, normal LVF, mild LVH, Grade 2 DD, normal RVF, trivial MR, mild-mod AS with mean gradient 14 mmHg   Neuro/Psych  Headaches  Anxiety        GI/Hepatic Neg liver ROS,GERD  Medicated and Controlled,,  Endo/Other  diabetes  BMI 38  Renal/GU Renal InsufficiencyRenal disease     Musculoskeletal  (+) Arthritis , Osteoarthritis,    Abdominal  (+) + obese  Peds  Hematology  (+) Blood dyscrasia (Hb 9.7), anemia   Anesthesia Other Findings Prostate cancer  Reproductive/Obstetrics                              Anesthesia Physical Anesthesia Plan  ASA: 4  Anesthesia Plan: General   Post-op Pain Management: Ofirmev  IV (intra-op)*   Induction: Intravenous  PONV Risk Score and Plan: 2 and Ondansetron , Dexamethasone  and Treatment may vary due to age or medical condition  Airway Management Planned: Oral ETT and Video Laryngoscope Planned  Additional Equipment: ClearSight  Intra-op Plan:   Post-operative Plan: Extubation in OR  Informed Consent: I have reviewed the patients History and Physical, chart, labs and discussed the procedure including the risks, benefits and alternatives for the proposed anesthesia with the patient or authorized representative who has indicated his/her understanding and acceptance.     Dental advisory given  Plan Discussed with: CRNA  Anesthesia Plan  Comments:         Anesthesia Quick Evaluation

## 2023-06-11 NOTE — Evaluation (Addendum)
 Physical Therapy Evaluation Patient Details Name: Carlos Dawson MRN: [id number] DOB: 1940/02/06 Today's Date: 06/11/2023  History of Present Illness  83 yo male presents to therapy s/p L THA, anterior approach on 06/11/2023 due to failure of conservative measures. Pt PMH includes but is not limited to: COPD, A-fib, lung nodule, diverticulitis, asthma, R wrist CTS, anxiety, prostate ca, anemia, R THA (2020), IBS, HOH, GERD, DM II, s/p lumbar fusion, cervical spine surgery, PE, OA, OSA, CHF, thoracis aortic aneurysm, and watchman closure device L atrial appendage.  Clinical Impression    Carlos Dawson is a 83 y.o. male POD 0 s/p L THA. Patient reports mod I with mobility at baseline. Patient is now limited by functional impairments (see PT problem list below) and requires S for bed mobility and CGA and cues for transfers. Patient was able to ambulate 50 feet with RW and CGA level of assist. Patient instructed in exercise to facilitate ROM and circulation to manage edema.Patient will benefit from continued skilled PT interventions to address impairments and progress towards PLOF. Acute PT will follow to progress mobility and stair training in preparation for safe discharge home with family support and HEP. Pt desaturating at time of eval to low 80s at end of therapy session on RA.       If plan is discharge home, recommend the following: A little help with walking and/or transfers;A little help with bathing/dressing/bathroom;Assistance with cooking/housework;Help with stairs or ramp for entrance;Assist for transportation   Can travel by private vehicle        Equipment Recommendations None recommended by PT  Recommendations for Other Services       Functional Status Assessment Patient has had a recent decline in their functional status and demonstrates the ability to make significant improvements in function in a reasonable and predictable amount of time.     Precautions / Restrictions  Precautions Precautions: Fall Restrictions Weight Bearing Restrictions Per Provider Order: No      Mobility  Bed Mobility Overal bed mobility: Needs Assistance Bed Mobility: Supine to Sit     Supine to sit: Supervision, HOB elevated, Used rails     General bed mobility comments: min cues    Transfers Overall transfer level: Needs assistance Equipment used: Rolling walker (2 wheels) Transfers: Sit to/from Stand Sit to Stand: Contact guard assist           General transfer comment: min cues    Ambulation/Gait Ambulation/Gait assistance: Contact guard assist Gait Distance (Feet): 50 Feet Assistive device: Rolling walker (2 wheels) Gait Pattern/deviations: Step-to pattern, Decreased step length - left, Antalgic Gait velocity: decreased     General Gait Details: B UE support at RW to offload R and L LE, L due to pain and R secondary to reported hx of instabiltiy with recent falls, cues for proper AD placement and safety with gait tasks. pt ed provided on use of RW vs rollator at time of d/c  Stairs            Wheelchair Mobility     Tilt Bed    Modified Rankin (Stroke Patients Only)       Balance Overall balance assessment: History of Falls, Needs assistance (2 falls past 6 months) Sitting-balance support: Feet supported Sitting balance-Leahy Scale: Good     Standing balance support: Bilateral upper extremity supported, During functional activity, Reliant on assistive device for balance Standing balance-Leahy Scale: Poor  Pertinent Vitals/Pain Pain Assessment Pain Assessment: 0-10 Pain Score: 6  Pain Location: L hip and LE Pain Descriptors / Indicators: Aching, Constant, Discomfort, Grimacing, Operative site guarding Pain Intervention(s): Limited activity within patient's tolerance, Monitored during session, Premedicated before session, Repositioned, Ice applied    Home Living Family/patient expects to  be discharged to:: Private residence Living Arrangements: Spouse/significant other Available Help at Discharge: Family Type of Home: House Home Access: Stairs to enter Entrance Stairs-Rails: Right;Left;Can reach both Entrance Stairs-Number of Steps: 3   Home Layout: Two level;Able to live on main level with bedroom/bathroom Home Equipment: Rollator (4 wheels);Rolling Walker (2 wheels);Cane - single point      Prior Function Prior Level of Function : Independent/Modified Independent;Driving             Mobility Comments: mod I with use of rollator for all ADLs and self care tasks       Extremity/Trunk Assessment        Lower Extremity Assessment Lower Extremity Assessment: LLE deficits/detail LLE Deficits / Details: ankle DF/PF 5/5 LLE Sensation: WNL    Cervical / Trunk Assessment Cervical / Trunk Assessment: Back Surgery;Neck Surgery  Communication   Communication Communication: No apparent difficulties    Cognition Arousal: Alert Behavior During Therapy: WFL for tasks assessed/performed   PT - Cognitive impairments: No apparent impairments                         Following commands: Intact       Cueing       General Comments      Exercises Total Joint Exercises Ankle Circles/Pumps: AROM, Both, 10 reps   Assessment/Plan    PT Assessment Patient needs continued PT services  PT Problem List Decreased strength;Decreased range of motion;Decreased activity tolerance;Decreased balance;Decreased mobility;Decreased coordination;Pain       PT Treatment Interventions DME instruction;Gait training;Stair training;Functional mobility training;Therapeutic exercise;Therapeutic activities;Balance training;Neuromuscular re-education;Patient/family education;Modalities    PT Goals (Current goals can be found in the Care Plan section)  Acute Rehab PT Goals Patient Stated Goal: to be able to travel PT Goal Formulation: With patient Time For Goal  Achievement: 06/28/2023 Potential to Achieve Goals: Good    Frequency 7X/week     Co-evaluation               AM-PAC PT "6 Clicks" Mobility  Outcome Measure Help needed turning from your back to your side while in a flat bed without using bedrails?: A Little Help needed moving from lying on your back to sitting on the side of a flat bed without using bedrails?: A Little Help needed moving to and from a bed to a chair (including a wheelchair)?: A Little Help needed standing up from a chair using your arms (e.g., wheelchair or bedside chair)?: A Little Help needed to walk in hospital room?: A Little Help needed climbing 3-5 steps with a railing? : A Lot 6 Click Score: 17    End of Session Equipment Utilized During Treatment: Gait belt Activity Tolerance: Patient tolerated treatment well;No increased pain Patient left: in chair;with call bell/phone within reach;with family/visitor present Nurse Communication: Mobility status PT Visit Diagnosis: Unsteadiness on feet (R26.81);Other abnormalities of gait and mobility (R26.89);Muscle weakness (generalized) (M62.81);History of falling (Z91.81);Difficulty in walking, not elsewhere classified (R26.2);Pain Pain - Right/Left: Left Pain - part of body: Hip;Leg    Time: 1740-1805 PT Time Calculation (min) (ACUTE ONLY): 25 min   Charges:   PT Evaluation $PT Eval Low Complexity:  1 Low PT Treatments $Gait Training: 8-22 mins PT General Charges $$ ACUTE PT VISIT: 1 Visit         Cary Clarks, PT Acute Rehab   Annalee Kiang 06/11/2023, 6:13 PM

## 2023-06-11 NOTE — Transfer of Care (Signed)
 Immediate Anesthesia Transfer of Care Note  Patient: Carlos Dawson  Procedure(s) Performed: ARTHROPLASTY, HIP, TOTAL, ANTERIOR APPROACH (Left: Hip)  Patient Location: PACU  Anesthesia Type:General  Level of Consciousness: awake, alert , and oriented  Airway & Oxygen  Therapy: Patient Spontanous Breathing  Post-op Assessment: Report given to RN  Post vital signs: Reviewed and stable  Last Vitals:  Vitals Value Taken Time  BP 98/68 06/11/23 1436  Temp    Pulse 65 06/11/23 1442  Resp 13 06/11/23 1442  SpO2 100 % 06/11/23 1442  Vitals shown include unfiled device data.  Last Pain:  Vitals:   06/11/23 1106  TempSrc:   PainSc: 2          Complications: No notable events documented.

## 2023-06-11 NOTE — Interval H&P Note (Signed)
 History and Physical Interval Note:  06/11/2023 11:44 AM  Carlos Dawson  has presented today for surgery, with the diagnosis of Left Hip Osteoarthritis.  The various methods of treatment have been discussed with the patient and family. After consideration of risks, benefits and other options for treatment, the patient has consented to  Procedure(s): ARTHROPLASTY, HIP, TOTAL, ANTERIOR APPROACH (Left) as a surgical intervention.  The patient's history has been reviewed, patient examined, no change in status, stable for surgery.  I have reviewed the patient's chart and labs.  Questions were answered to the patient's satisfaction.     Samuel Crock Benedetta Sundstrom

## 2023-06-11 NOTE — Anesthesia Procedure Notes (Signed)
 Procedure Name: Intubation Date/Time: 06/11/2023 12:56 PM  Performed by: Raylene Calamity, CRNAPre-anesthesia Checklist: Patient identified, Emergency Drugs available, Suction available and Patient being monitored Patient Re-evaluated:Patient Re-evaluated prior to induction Oxygen  Delivery Method: Circle System Utilized Preoxygenation: Pre-oxygenation with 100% oxygen  Induction Type: IV induction Ventilation: Mask ventilation without difficulty Laryngoscope Size: Glidescope and 4 Grade View: Grade I Tube type: Oral Tube size: 7.5 mm Number of attempts: 1 Airway Equipment and Method: Stylet and Oral airway Placement Confirmation: ETT inserted through vocal cords under direct vision, positive ETCO2 and breath sounds checked- equal and bilateral Secured at: 22 cm Tube secured with: Tape Dental Injury: Teeth and Oropharynx as per pre-operative assessment

## 2023-06-11 NOTE — Progress Notes (Signed)
   06/11/23 2302  BiPAP/CPAP/SIPAP  BiPAP/CPAP/SIPAP Pt Type Adult  BiPAP/CPAP/SIPAP DREAMSTATIOND  Mask Type Full face mask  Dentures removed? Not applicable  Mask Size Medium  FiO2 (%) 21 %  Patient Home Machine No  Patient Home Mask Yes  Patient Home Tubing Yes  Auto Titrate Yes  Minimum cmH2O 5 cmH2O  Maximum cmH2O 20 cmH2O  Device Plugged into RED Power Outlet Yes

## 2023-06-12 ENCOUNTER — Encounter (HOSPITAL_COMMUNITY): Payer: Self-pay | Admitting: Orthopedic Surgery

## 2023-06-12 LAB — BASIC METABOLIC PANEL WITH GFR
Anion gap: 8 (ref 5–15)
BUN: 30 mg/dL — ABNORMAL HIGH (ref 8–23)
CO2: 22 mmol/L (ref 22–32)
Calcium: 7.9 mg/dL — ABNORMAL LOW (ref 8.9–10.3)
Chloride: 105 mmol/L (ref 98–111)
Creatinine, Ser: 1.16 mg/dL (ref 0.61–1.24)
GFR, Estimated: >60 mL/min (ref >60)
Glucose, Bld: 222 mg/dL — ABNORMAL HIGH (ref 70–99)
Potassium: 4.5 mmol/L (ref 3.5–5.1)
Sodium: 135 mmol/L (ref 135–145)

## 2023-06-12 LAB — CBC
HCT: 27.5 % — ABNORMAL LOW (ref 39.0–52.0)
Hemoglobin: 8.9 g/dL — ABNORMAL LOW (ref 13.0–17.0)
MCH: 36.2 pg — ABNORMAL HIGH (ref 26.0–34.0)
MCHC: 32.4 g/dL (ref 30.0–36.0)
MCV: 111.8 fL — ABNORMAL HIGH (ref 80.0–100.0)
Platelets: 150 10*3/uL (ref 150–400)
RBC: 2.46 MIL/uL — ABNORMAL LOW (ref 4.22–5.81)
RDW: 14.1 % (ref 11.5–15.5)
WBC: 10 10*3/uL (ref 4.0–10.5)
nRBC: 0.3 % — ABNORMAL HIGH (ref 0.0–0.2)

## 2023-06-12 LAB — GLUCOSE, CAPILLARY
Glucose-Capillary: 159 mg/dL — ABNORMAL HIGH (ref 70–99)
Glucose-Capillary: 182 mg/dL — ABNORMAL HIGH (ref 70–99)
Glucose-Capillary: 173 mg/dL — ABNORMAL HIGH (ref 70–99)
Glucose-Capillary: 198 mg/dL — ABNORMAL HIGH (ref 70–99)

## 2023-06-12 MED ORDER — SIMETHICONE 80 MG PO CHEW
80.0000 mg | CHEWABLE_TABLET | Freq: Four times a day (QID) | ORAL | Status: DC | PRN
  Administered 2023-06-12 – 2023-06-24 (×5): 80 mg via ORAL
  Filled 2023-06-12 (×6): qty 1

## 2023-06-12 MED ORDER — SODIUM CHLORIDE 0.9 % IV BOLUS
500.0000 mL | Freq: Once | INTRAVENOUS | Status: AC
  Administered 2023-06-12: 500 mL via INTRAVENOUS

## 2023-06-12 NOTE — Progress Notes (Signed)
 Subjective: 1 Day Post-Op Procedure(s) (LRB): ARTHROPLASTY, HIP, TOTAL, ANTERIOR APPROACH (Left) Patient reports pain as mild.   Patient seen in rounds by Dr. France Ina. Denies chest pain, SOB, or calf pain.  We will continue therapy today, ambulated 50' yesterday. Of note had issues with oxygen  saturation dropping with ambulation.   Objective: Vital signs in last 24 hours: Temp:  [97.7 F (36.5 C)-98.9 F (37.2 C)] 98.8 F (37.1 C) (05/20 0622) Pulse Rate:  [61-90] 70 (05/20 0622) Resp:  [14-23] 16 (05/20 0622) BP: (84-129)/(48-77) 87/56 (05/20 0622) SpO2:  [92 %-100 %] 95 % (05/20 0828) FiO2 (%):  [21 %] 21 % (05/19 2302) Weight:  [106.1 kg] 106.1 kg (05/19 1106)  Intake/Output from previous day:  Intake/Output Summary (Last 24 hours) at 06/12/2023 0832 Last data filed at 06/12/2023 0724 Gross per 24 hour  Intake 1806.38 ml  Output 900 ml  Net 906.38 ml     Intake/Output this shift: No intake/output data recorded.  Labs: Recent Labs    06/12/23 0317  HGB 8.9*   Recent Labs    06/12/23 0317  WBC 10.0  RBC 2.46*  HCT 27.5*  PLT 150   Recent Labs    06/12/23 0317  NA 135  K 4.5  CL 105  CO2 22  BUN 30*  CREATININE 1.16  GLUCOSE 222*  CALCIUM  7.9*   No results for input(s): "LABPT", "INR" in the last 72 hours.  Exam: General - Patient is Alert and Oriented Extremity - Neurologically intact Neurovascular intact Sensation intact distally Dorsiflexion/Plantar flexion intact Dressing - dressing C/D/I Motor Function - intact, moving foot and toes well on exam.   Past Medical History:  Diagnosis Date   Anticoagulant long-term use    Aortic root enlargement (HCC)    Ascending aortic aneurysm (HCC)    recent scan in October 2012 showing no change; followed by Dr. Nicanor Barge   ASCVD (arteriosclerotic cardiovascular disease)    Prior BMS to the 2nd OM in September 2012; with repeat cath in October showing patency   CAD (coronary artery disease)    a.  s/p BMS to 2nd OM in Sept 2012; b. LexiScan  Myoview  (12/2012):  Inf infarct; bowel and motion artifact make study difficult to interpret; no ischemia; not gated; Low Risk   CHF (congestive heart failure) (HCC)    no recent issues 10/13/14   Chronic back pain    "all over my back" (05/11/2017)   Colonic polyp    Contact lens/glasses fitting    COPD (chronic obstructive pulmonary disease) (HCC)    Diastolic dysfunction    DVT (deep venous thrombosis) (HCC)    ?LLE   GERD (gastroesophageal reflux disease)    Hearing loss    Hearing loss    more so on left   Hemorrhoids    History of stomach ulcers    Hypertension    IBS (irritable bowel syndrome)    LVH (left ventricular hypertrophy)    OA (osteoarthritis)    "all over" (05/11/2017)   Obesities, morbid (HCC)    OSA (obstructive sleep apnea)    PSG 03/30/97 AHI 21, BPAP 13/9   OSA on CPAP    PAF (paroxysmal atrial fibrillation) (HCC)    a. on Xarelto  b. s/p DCCV in 08/2016; b. Tikosyn  failed 04/16/17 with plans for Multaq  and possible Afib ablation with Dr. Nunzio Belch   Pneumonia    'several times" (05/11/2017)   Presence of Watchman left atrial appendage closure device 06/15/2022   24mm Watchman  FLX Pro placed by Dr. Marven Slimmer   Prostate CA [facility])    Oncologist  DR. Cristal Don [facility] dx 09/24/14, undetermined tx   prostate; S/P "radiation and hormone injections"   Pulmonary embolism (HCC) 2008   "both lungs"   SOB (shortness of breath)    on excertion   Thoracic aortic aneurysm (HCC)    Aortic Size Index=     5.0    /Body surface area is 2.43 meters squared. = 2.05  < 2.75 cm/m2      4% risk per year 2.75 to 4.25          8% risk per year > 4.25 cm/m2    20% risk per year   Stable aneurysmal dilation of the ascending aorta with maximum AP diameter of 4.8 cm. Stable area of narrowing of the proximal most portion of the descending aorta measuring 2 cm., previously identified as an area of coarctation. No evidence of aortic dissection.  Coronary  artery disease.  Normal appearance of the lungs.   Electronically Signed   By: Dobrinka  Dimitrova M.D.   On: 10/01/2014 08:50     Type II diabetes mellitus (HCC)    metphormin, average 154 dx 2017    Assessment/Plan: 1 Day Post-Op Procedure(s) (LRB): ARTHROPLASTY, HIP, TOTAL, ANTERIOR APPROACH (Left) Principal Problem:   OA (osteoarthritis) of hip Active Problems:   Primary osteoarthritis of left hip  Estimated body mass index is 38.35 kg/m as calculated from the following:   Height as of this encounter: 5' 5.5" (1.664 m).   Weight as of this encounter: 106.1 kg. Up with therapy  Anticipated LOS equal to or greater than 2 midnights due to - Age 34 and older with one or more of the following:  - Obesity  - Expected need for hospital services (PT, OT, Nursing) required for safe  discharge  - Anticipated need for postoperative skilled nursing care or inpatient rehab  - Active co-morbidities: DVT/VTE and Cardiac Arrhythmia OR   - Unanticipated findings during/Post Surgery: None  - Patient is a high risk of re-admission due to: None   DVT Prophylaxis - Xarelto  Weight bearing as tolerated. Continue therapy.  BP soft this AM, bolus ordered. Hemoglobin 8.5 from 11 preoperatively. Given multiple medical issues, will plan for probable discharge tomorrow. Will recheck CBC in the AM.  Sharlynn Dear, PA-C Orthopedic Surgery [phone] 06/12/2023, 8:32 AM

## 2023-06-12 NOTE — TOC Transition Note (Signed)
 Transition of Care [facility]) - Discharge Note   Patient Details  Name: Carlos Dawson MRN: [id number] Date of Birth: 1940/12/31  [facility]) CM/SW Contact:  Delilah Fend, LCSW Phone Number: 06/12/2023, 11:29 AM   Clinical Narrative:     Met with pt who confirms he has needed DME in the home.  Plan for HEP.  No further TOC needs.  Final next level of care: Home/Self Care Barriers to Discharge: No Barriers Identified   Patient Goals and CMS Choice Patient states their goals for this hospitalization and ongoing recovery are:: return home          Discharge Placement                       Discharge Plan and Services Additional resources added to the After Visit Summary for                  DME Arranged: N/A DME Agency: NA                  Social Drivers of Health (SDOH) Interventions SDOH Screenings   Food Insecurity: No Food Insecurity (06/11/2023)  Housing: Low Risk  (06/11/2023)  Transportation Needs: No Transportation Needs (06/11/2023)  Utilities: Not At Risk (06/11/2023)  Social Connections: Socially Integrated (06/11/2023)  Tobacco Use: Medium Risk (06/11/2023)     Readmission Risk Interventions     No data to display

## 2023-06-12 NOTE — Plan of Care (Signed)
  Problem: Education: Goal: Knowledge of General Education information will improve Description: Including pain rating scale, medication(s)/side effects and non-pharmacologic comfort measures Outcome: Progressing   Problem: Health Behavior/Discharge Planning: Goal: Ability to manage health-related needs will improve Outcome: Progressing   Problem: Clinical Measurements: Goal: Ability to maintain clinical measurements within normal limits will improve Outcome: Progressing Goal: Will remain free from infection Outcome: Progressing Goal: Diagnostic test results will improve Outcome: Progressing Goal: Respiratory complications will improve Outcome: Progressing Goal: Cardiovascular complication will be avoided Outcome: Progressing   Problem: Activity: Goal: Risk for activity intolerance will decrease Outcome: Adequate for Discharge   Problem: Coping: Goal: Level of anxiety will decrease Outcome: Progressing   Problem: Elimination: Goal: Will not experience complications related to bowel motility Outcome: Completed/Met   Problem: Pain Managment: Goal: General experience of comfort will improve and/or be controlled Outcome: Progressing   Problem: Safety: Goal: Ability to remain free from injury will improve Outcome: Progressing   Problem: Skin Integrity: Goal: Risk for impaired skin integrity will decrease Outcome: Adequate for Discharge   Problem: Education: Goal: Knowledge of General Education information will improve Description: Including pain rating scale, medication(s)/side effects and non-pharmacologic comfort measures Outcome: Adequate for Discharge   Problem: Clinical Measurements: Goal: Ability to maintain clinical measurements within normal limits will improve Outcome: Progressing Goal: Will remain free from infection Outcome: Progressing Goal: Diagnostic test results will improve Outcome: Progressing   Problem: Nutrition: Goal: Adequate nutrition will  be maintained Outcome: Completed/Met   Problem: Pain Managment: Goal: General experience of comfort will improve and/or be controlled Outcome: Adequate for Discharge   Problem: Safety: Goal: Ability to remain free from injury will improve Outcome: Progressing   Problem: Skin Integrity: Goal: Risk for impaired skin integrity will decrease Outcome: Adequate for Discharge   Problem: Education: Goal: Knowledge of the prescribed therapeutic regimen will improve Outcome: Progressing Goal: Understanding of discharge needs will improve Outcome: Progressing   Problem: Activity: Goal: Ability to avoid complications of mobility impairment will improve Outcome: Progressing Goal: Ability to tolerate increased activity will improve Outcome: Progressing   Problem: Clinical Measurements: Goal: Postoperative complications will be avoided or minimized Outcome: Progressing   Problem: Pain Management: Goal: Pain level will decrease with appropriate interventions Outcome: Adequate for Discharge   Problem: Skin Integrity: Goal: Will show signs of wound healing Outcome: Progressing   Problem: Nutritional: Goal: Maintenance of adequate nutrition will improve Outcome: Completed/Met

## 2023-06-12 NOTE — Care Management Obs Status (Signed)
 MEDICARE OBSERVATION STATUS NOTIFICATION   Patient Details  Name: Carlos Dawson MRN: [id number] Date of Birth: 04-13-1940   Medicare Observation Status Notification Given:  Yes    Delilah Fend, LCSW 06/12/2023, 11:19 AM

## 2023-06-12 NOTE — Progress Notes (Signed)
 Physical Therapy Treatment Patient Details Name: Carlos Dawson MRN: [id number] DOB: 05-30-1940 Today's Date: 06/12/2023   History of Present Illness 83 yo male presents to therapy s/p L THA, anterior approach on 06/11/2023 due to failure of conservative measures. Pt PMH includes but is not limited to: COPD, A-fib, lung nodule, diverticulitis, asthma, R wrist CTS, anxiety, prostate ca, anemia, R THA (2020), IBS, HOH, GERD, DM II, s/p lumbar fusion, cervical spine surgery, PE, OA, OSA, CHF, thoracis aortic aneurysm, and watchman closure device L atrial appendage.    PT Comments  Noting pt's BP soft earlier and received bolus.  Bp was stable during therapy and O2 sats remained >92% on RA.  He was able to ambulate 17' with RW and CGA but heavy dependence on offloading on RW.  Pt reports hx of R hip instability. Noting plan for d/c per ortho notes. Will cont POC.     If plan is discharge home, recommend the following: A little help with walking and/or transfers;A little help with bathing/dressing/bathroom;Assistance with cooking/housework;Help with stairs or ramp for entrance;Assist for transportation   Can travel by private vehicle        Equipment Recommendations  None recommended by PT    Recommendations for Other Services       Precautions / Restrictions Precautions Precautions: Fall Restrictions LLE Weight Bearing Per Provider Order: Weight bearing as tolerated     Mobility  Bed Mobility               General bed mobility comments: in chair    Transfers Overall transfer level: Needs assistance Equipment used: Rolling walker (2 wheels) Transfers: Sit to/from Stand Sit to Stand: Contact guard assist                Ambulation/Gait Ambulation/Gait assistance: Contact guard assist Gait Distance (Feet): 60 Feet Assistive device: Rolling walker (2 wheels) Gait Pattern/deviations: Step-to pattern, Decreased step length - left, Antalgic Gait velocity: decreased      General Gait Details: Heavy dependence on RW to offload.  Reports R LE had been giving out on him prior (had THA in 2020 -has followed up with imaging and everything normal)   Stairs             Wheelchair Mobility     Tilt Bed    Modified Rankin (Stroke Patients Only)       Balance Overall balance assessment: History of Falls, Needs assistance Sitting-balance support: Feet supported Sitting balance-Leahy Scale: Good     Standing balance support: Bilateral upper extremity supported, During functional activity, Reliant on assistive device for balance Standing balance-Leahy Scale: Poor                              Communication    Cognition Arousal: Alert Behavior During Therapy: WFL for tasks assessed/performed   PT - Cognitive impairments: No apparent impairments                                Cueing    Exercises General Exercises - Lower Extremity Ankle Circles/Pumps: AROM, Both, 10 reps, Supine Quad Sets: AROM, Both, 10 reps, Supine Long Arc Quad: AROM, Both, 10 reps, Seated Hip ABduction/ADduction: AAROM, Both, 10 reps, Supine    General Comments        Pertinent Vitals/Pain Pain Assessment Pain Assessment: 0-10 Pain Score: 6  Pain Location: L hip Pain  Descriptors / Indicators: Aching, Constant, Discomfort, Grimacing, Operative site guarding Pain Intervention(s): Limited activity within patient's tolerance, Monitored during session, Premedicated before session, Repositioned, Ice applied    Home Living                          Prior Function            PT Goals (current goals can now be found in the care plan section) Progress towards PT goals: Progressing toward goals    Frequency    7X/week      PT Plan      Co-evaluation              AM-PAC PT "6 Clicks" Mobility   Outcome Measure  Help needed turning from your back to your side while in a flat bed without using bedrails?: A  Little Help needed moving from lying on your back to sitting on the side of a flat bed without using bedrails?: A Little Help needed moving to and from a bed to a chair (including a wheelchair)?: A Little Help needed standing up from a chair using your arms (e.g., wheelchair or bedside chair)?: A Little Help needed to walk in hospital room?: A Little Help needed climbing 3-5 steps with a railing? : A Lot 6 Click Score: 17    End of Session Equipment Utilized During Treatment: Gait belt Activity Tolerance: Patient tolerated treatment well Patient left: in chair;with call bell/phone within reach;with chair alarm set Nurse Communication: Mobility status PT Visit Diagnosis: Unsteadiness on feet (R26.81);Other abnormalities of gait and mobility (R26.89);Muscle weakness (generalized) (M62.81);History of falling (Z91.81);Difficulty in walking, not elsewhere classified (R26.2);Pain Pain - Right/Left: Left Pain - part of body: Hip;Leg     Time: 7829-5621 PT Time Calculation (min) (ACUTE ONLY): 21 min  Charges:    $Gait Training: 8-22 mins PT General Charges $$ ACUTE PT VISIT: 1 Visit                     Cyd Dowse, PT Acute Rehab Services [facility] [phone]    Carolynn Citrin 06/12/2023, 11:12 AM

## 2023-06-12 NOTE — Progress Notes (Signed)
 Physical Therapy Treatment Patient Details Name: TORRIN CRIHFIELD MRN: [id number] DOB: 1940/12/18 Today's Date: 06/12/2023   History of Present Illness 83 yo male presents to therapy s/p L THA, anterior approach on 06/11/2023 due to failure of conservative measures. Pt PMH includes but is not limited to: COPD, A-fib, lung nodule, diverticulitis, asthma, R wrist CTS, anxiety, prostate ca, anemia, R THA (2020), IBS, HOH, GERD, DM II, s/p lumbar fusion, cervical spine surgery, PE, OA, OSA, CHF, thoracis aortic aneurysm, and watchman closure device L atrial appendage.    PT Comments  Pt with increased pain this afternoon and unable to mobilize.  He had recently ambulated to restroom twice and now having spasms in L thigh.  Pt had received medications.  Session focused on pain relief techniques including soft tissue mobilization, repositioning, and ice.  Pt with decreased spasms post session.  Plan to f/u tomorrow for further mobility.  Encouraged to ambulate later tonight if able with nursing.  SCD resumed.     If plan is discharge home, recommend the following: A little help with walking and/or transfers;A little help with bathing/dressing/bathroom;Assistance with cooking/housework;Help with stairs or ramp for entrance;Assist for transportation   Can travel by private vehicle        Equipment Recommendations  None recommended by PT    Recommendations for Other Services       Precautions / Restrictions Precautions Precautions: Fall Restrictions LLE Weight Bearing Per Provider Order: Weight bearing as tolerated     Mobility  Bed Mobility               General bed mobility comments: Pt in supine, too painful to mobilize.    TStairs             Wheelchair Mobility     Tilt Bed    Modified Rankin (Stroke Patients Only)       Balance Overall balance assessment: History of Falls, Needs assistance Sitting-balance support: Feet supported Sitting balance-Leahy Scale:  Good     Standing balance support: Bilateral upper extremity supported, During functional activity, Reliant on assistive device for balance Standing balance-Leahy Scale: Poor                              Communication    Cognition Arousal: Alert Behavior During Therapy: WFL for tasks assessed/performed   PT - Cognitive impairments: No apparent impairments                                Cueing    Exercises Total Joint Exercises Ankle Circles/Pumps: AROM, Both, 10 reps   General Comments General comments (skin integrity, edema, etc.): Pt in bed and was having spasms every minute, unable to mobilize.  He had recently been to restroom twice.  Performed gentle trigger point and cross friction massage on quad just distal to dressing, IT band, and trigger point at location near greater trochanter.  Pt with significant edema.  Additionally repositioned in bed,. +2 total A to scoot up in bed , then bent knees of bed and placed 2 pillows under L LE. Pt reports good relief.      Pertinent Vitals/Pain Pain Assessment Pain Assessment: 0-10 Pain Score: 9  Pain Location: L hip Pain Descriptors / Indicators: Spasm Pain Intervention(s): Limited activity within patient's tolerance, Monitored during session, Premedicated before session, Repositioned, Ice applied, Other (comment) (soft tissue mobilization)  Home Living                          Prior Function            PT Goals (current goals can now be found in the care plan section) Progress towards PT goals: Not progressing toward goals - comment (limited by pain)    Frequency    7X/week      PT Plan      Co-evaluation              AM-PAC PT "6 Clicks" Mobility   Outcome Measure  Help needed turning from your back to your side while in a flat bed without using bedrails?: A Little Help needed moving from lying on your back to sitting on the side of a flat bed without using bedrails?: A  Little Help needed moving to and from a bed to a chair (including a wheelchair)?: A Little Help needed standing up from a chair using your arms (e.g., wheelchair or bedside chair)?: A Little Help needed to walk in hospital room?: A Little Help needed climbing 3-5 steps with a railing? : A Lot 6 Click Score: 17    End of Session Equipment Utilized During Treatment: Gait belt Activity Tolerance: Patient limited by pain Patient left: in bed;with call bell/phone within reach;with bed alarm set Nurse Communication: Mobility status PT Visit Diagnosis: Unsteadiness on feet (R26.81);Other abnormalities of gait and mobility (R26.89);Muscle weakness (generalized) (M62.81);History of falling (Z91.81);Difficulty in walking, not elsewhere classified (R26.2);Pain Pain - Right/Left: Left Pain - part of body: Hip;Leg     Time: 1610-9604 PT Time Calculation (min) (ACUTE ONLY): 17 min  Charges:    $Therapeutic Activity: 8-22 mins PT General Charges $$ ACUTE PT VISIT: 1 Visit                     Cyd Dowse, PT Acute Rehab [facility] [phone]    Carolynn Citrin 06/12/2023, 5:05 PM

## 2023-06-12 NOTE — Plan of Care (Signed)
  Problem: Education: Goal: Knowledge of General Education information will improve Description: Including pain rating scale, medication(s)/side effects and non-pharmacologic comfort measures Outcome: Progressing   Problem: Health Behavior/Discharge Planning: Goal: Ability to manage health-related needs will improve Outcome: Progressing   Problem: Clinical Measurements: Goal: Ability to maintain clinical measurements within normal limits will improve Outcome: Progressing Goal: Will remain free from infection Outcome: Progressing Goal: Diagnostic test results will improve Outcome: Progressing Goal: Respiratory complications will improve Outcome: Progressing Goal: Cardiovascular complication will be avoided Outcome: Progressing   Problem: Activity: Goal: Risk for activity intolerance will decrease Outcome: Progressing   Problem: Nutrition: Goal: Adequate nutrition will be maintained Outcome: Completed/Met   Problem: Coping: Goal: Level of anxiety will decrease Outcome: Progressing   Problem: Elimination: Goal: Will not experience complications related to bowel motility Outcome: Progressing Goal: Will not experience complications related to urinary retention Outcome: Completed/Met   Problem: Pain Managment: Goal: General experience of comfort will improve and/or be controlled Outcome: Progressing   Problem: Elimination: Goal: Will not experience complications related to bowel motility Outcome: Progressing Goal: Will not experience complications related to urinary retention Outcome: Completed/Met   Problem: Pain Managment: Goal: General experience of comfort will improve and/or be controlled Outcome: Progressing   Problem: Safety: Goal: Ability to remain free from injury will improve Outcome: Progressing   Problem: Skin Integrity: Goal: Risk for impaired skin integrity will decrease Outcome: Progressing   Problem: Education: Goal: Knowledge of the prescribed  therapeutic regimen will improve Outcome: Progressing Goal: Understanding of discharge needs will improve Outcome: Progressing Goal: Individualized Educational Video(s) Outcome: Completed/Met   Problem: Activity: Goal: Ability to avoid complications of mobility impairment will improve Outcome: Progressing Goal: Ability to tolerate increased activity will improve Outcome: Progressing   Problem: Clinical Measurements: Goal: Postoperative complications will be avoided or minimized Outcome: Progressing   Problem: Pain Management: Goal: Pain level will decrease with appropriate interventions Outcome: Progressing   Problem: Skin Integrity: Goal: Will show signs of wound healing Outcome: Progressing

## 2023-06-12 NOTE — Plan of Care (Signed)

## 2023-06-12 NOTE — Anesthesia Postprocedure Evaluation (Signed)
 Anesthesia Post Note  Patient: Carlos Dawson  Procedure(s) Performed: ARTHROPLASTY, HIP, TOTAL, ANTERIOR APPROACH (Left: Hip)     Patient location during evaluation: PACU Anesthesia Type: General Level of consciousness: sedated and patient cooperative Pain management: pain level controlled Vital Signs Assessment: post-procedure vital signs reviewed and stable Respiratory status: spontaneous breathing Cardiovascular status: stable Anesthetic complications: no  No notable events documented.  Last Vitals:  Vitals:   06/12/23 0138 06/12/23 0622  BP: (!) 84/53 (!) 87/56  Pulse: 65 70  Resp: 16 16  Temp: 37.2 C 37.1 C  SpO2: 98% 93%    Last Pain:  Vitals:   06/12/23 0622  TempSrc: Oral  PainSc:                  Gorman Laughter

## 2023-06-13 LAB — CBC
HCT: 24.8 % — ABNORMAL LOW (ref 39.0–52.0)
Hemoglobin: 8.1 g/dL — ABNORMAL LOW (ref 13.0–17.0)
MCH: 36.3 pg — ABNORMAL HIGH (ref 26.0–34.0)
MCHC: 32.7 g/dL (ref 30.0–36.0)
MCV: 111.2 fL — ABNORMAL HIGH (ref 80.0–100.0)
Platelets: 125 10*3/uL — ABNORMAL LOW (ref 150–400)
RBC: 2.23 MIL/uL — ABNORMAL LOW (ref 4.22–5.81)
RDW: 14.2 % (ref 11.5–15.5)
WBC: 6.7 10*3/uL (ref 4.0–10.5)
nRBC: 0.3 % — ABNORMAL HIGH (ref 0.0–0.2)

## 2023-06-13 LAB — GLUCOSE, CAPILLARY
Glucose-Capillary: 131 mg/dL — ABNORMAL HIGH (ref 70–99)
Glucose-Capillary: 160 mg/dL — ABNORMAL HIGH (ref 70–99)
Glucose-Capillary: 157 mg/dL — ABNORMAL HIGH (ref 70–99)
Glucose-Capillary: 209 mg/dL — ABNORMAL HIGH (ref 70–99)

## 2023-06-13 MED ORDER — CYCLOBENZAPRINE HCL 10 MG PO TABS: 10.0000 mg | ORAL_TABLET | Freq: Three times a day (TID) | ORAL | 0 refills | Status: DC | PRN

## 2023-06-13 MED ORDER — HYDROCODONE-ACETAMINOPHEN 5-325 MG PO TABS: 1.0000 | ORAL_TABLET | Freq: Four times a day (QID) | ORAL | 0 refills | Status: DC | PRN

## 2023-06-13 MED ORDER — SODIUM CHLORIDE 0.9 % IV BOLUS
500.0000 mL | Freq: Once | INTRAVENOUS | Status: AC
  Administered 2023-06-13: 500 mL via INTRAVENOUS

## 2023-06-13 MED ORDER — ONDANSETRON HCL 4 MG PO TABS: 4.0000 mg | ORAL_TABLET | Freq: Four times a day (QID) | ORAL | 0 refills | Status: DC | PRN

## 2023-06-13 MED ORDER — RIVAROXABAN 10 MG PO TABS: 10.0000 mg | ORAL_TABLET | Freq: Every day | ORAL | 0 refills | Status: DC

## 2023-06-13 NOTE — Progress Notes (Signed)
 Physical Therapy Treatment Patient Details Name: Carlos Dawson MRN: [id number] DOB: [birth year] Today's Date: 06/13/2023   History of Present Illness 83 yo male presents to therapy s/p L THA, anterior approach on 06/11/2023 due to failure of conservative measures. Pt PMH includes but is not limited to: COPD, A-fib, lung nodule, diverticulitis, asthma, R wrist CTS, anxiety, prostate ca, anemia, R THA (2020), IBS, HOH, GERD, DM II, s/p lumbar fusion, cervical spine surgery, PE, OA, OSA, CHF, thoracis aortic aneurysm, and watchman closure device L atrial appendage.    PT Comments  Pt with slow progress.  He did have decreased spasms compared to yesterday afternoon, but not moving as well as he did yesterday morning.  Pt needing mod A for bed mobility, min A to stand, and ambulated 25'x2 but at slow/non-functional speed and shuffle steps.  Does not have foot clearance to attempt stairs at this time.  Will f/u with pt in afternoon; however, based on this morning doubt that pt will have mobility level needed to return home.     If plan is discharge home, recommend the following: Assistance with cooking/housework;Help with stairs or ramp for entrance;Assist for transportation;A lot of help with walking and/or transfers;A lot of help with bathing/dressing/bathroom   Can travel by private vehicle        Equipment Recommendations  None recommended by PT    Recommendations for Other Services       Precautions / Restrictions Precautions Precautions: Fall Precaution/Restrictions Comments: Reports R (non-surgical) hip buckles Restrictions LLE Weight Bearing Per Provider Order: Weight bearing as tolerated     Mobility  Bed Mobility Overal bed mobility: Needs Assistance Bed Mobility: Supine to Sit     Supine to sit: HOB elevated, Mod assist     General bed mobility comments: Assist for L LE and lifting trunk; increased time and effort from pt with dyspnea    Transfers Overall transfer  level: Needs assistance Equipment used: Rolling walker (2 wheels) Transfers: Sit to/from Stand Sit to Stand: Min assist           General transfer comment: Performed x 2; min A and increased time to rise    Ambulation/Gait Ambulation/Gait assistance: Contact guard assist Gait Distance (Feet): 25 Feet (25'x2) Assistive device: Rolling walker (2 wheels) Gait Pattern/deviations: Step-to pattern, Decreased step length - left, Antalgic Gait velocity: decreased/non-functional/0.1 ft/sec Gait velocity interpretation: <1.8 ft/sec, indicate of risk for recurrent falls   General Gait Details: Heavy dependence on RW to offload.  Reports R LE had been giving out on him prior (had THA in 2020 -has followed up with imaging and everything normal). Very small steps with shuffle/no foot clearance   Stairs             Wheelchair Mobility     Tilt Bed    Modified Rankin (Stroke Patients Only)       Balance Overall balance assessment: History of Falls, Needs assistance   Sitting balance-Leahy Scale: Good     Standing balance support: Bilateral upper extremity supported, During functional activity, Reliant on assistive device for balance Standing balance-Leahy Scale: Poor                              Communication    Cognition Arousal: Alert Behavior During Therapy: WFL for tasks assessed/performed   PT - Cognitive impairments: No apparent impairments  Cueing    Exercises Total Joint Exercises Ankle Circles/Pumps: AROM, Both, 10 reps, Supine Quad Sets: AROM, Both, 10 reps, Supine Heel Slides: AAROM, Both, 10 reps, Supine, Limitations Heel Slides Limitations: gait belt to assist Long Arc Quad: AROM, Both, 10 reps, Seated    General Comments General comments (skin integrity, edema, etc.): Pt on RA with VSS but 3/4 dyspnea with activity.  Reports his R hip buckled earlier and he sat abruptly on [facility]      Pertinent  Vitals/Pain Pain Assessment Pain Assessment: 0-10 Pain Score: 5  Pain Location: L hip Pain Descriptors / Indicators: Discomfort Pain Intervention(s): Limited activity within patient's tolerance, Monitored during session, Premedicated before session, Repositioned, Ice applied    Home Living                          Prior Function            PT Goals (current goals can now be found in the care plan section) Progress towards PT goals: Progressing toward goals    Frequency    7X/week      PT Plan      Co-evaluation              AM-PAC PT "6 Clicks" Mobility   Outcome Measure  Help needed turning from your back to your side while in a flat bed without using bedrails?: A Lot Help needed moving from lying on your back to sitting on the side of a flat bed without using bedrails?: A Lot Help needed moving to and from a bed to a chair (including a wheelchair)?: A Little Help needed standing up from a chair using your arms (e.g., wheelchair or bedside chair)?: A Little Help needed to walk in hospital room?: A Little Help needed climbing 3-5 steps with a railing? : Total 6 Click Score: 14    End of Session Equipment Utilized During Treatment: Gait belt Activity Tolerance: Patient limited by pain Patient left: with call bell/phone within reach;with chair alarm set;in chair Nurse Communication: Mobility status PT Visit Diagnosis: Unsteadiness on feet (R26.81);Other abnormalities of gait and mobility (R26.89);Muscle weakness (generalized) (M62.81);History of falling (Z91.81);Difficulty in walking, not elsewhere classified (R26.2);Pain Pain - Right/Left: Left Pain - part of body: Hip;Leg     Time: 1610-9604 PT Time Calculation (min) (ACUTE ONLY): 32 min  Charges:    $Gait Training: 8-22 mins $Therapeutic Exercise: 8-22 mins PT General Charges $$ ACUTE PT VISIT: 1 Visit                     Cyd Dowse, PT Acute Rehab Services [facility]  [phone]    Carolynn Citrin 06/13/2023, 11:20 AM

## 2023-06-13 NOTE — Progress Notes (Signed)
 Subjective: 2 Days Post-Op Procedure(s) (LRB): ARTHROPLASTY, HIP, TOTAL, ANTERIOR APPROACH (Left) Patient reports pain as moderate.   Patient seen in rounds by Dr. France Ina. Patient having mostly issues with spasms, responding decently to flexeril . Has had both tizanidine and robaxin  in the past with minimal benefit. Will leave meds as is.Otherwise doing well.  Plan is to go Home after hospital stay.  Objective: Vital signs in last 24 hours: Temp:  [98.3 F (36.8 C)-99.1 F (37.3 C)] 98.3 F (36.8 C) (05/21 0656) Pulse Rate:  [74-102] 91 (05/21 0656) Resp:  [18] 18 (05/21 0656) BP: (103-123)/(50-66) 119/65 (05/21 0656) SpO2:  [93 %-97 %] 97 % (05/21 0656) FiO2 (%):  [21 %] 21 % (05/21 0000)  Intake/Output from previous day:  Intake/Output Summary (Last 24 hours) at 06/13/2023 0916 Last data filed at 06/13/2023 0600 Gross per 24 hour  Intake 1640 ml  Output 1125 ml  Net 515 ml    Intake/Output this shift: No intake/output data recorded.  Labs: Recent Labs    06/12/23 0317 06/13/23 0316  HGB 8.9* 8.1*   Recent Labs    06/12/23 0317 06/13/23 0316  WBC 10.0 6.7  RBC 2.46* 2.23*  HCT 27.5* 24.8*  PLT 150 125*   Recent Labs    06/12/23 0317  NA 135  K 4.5  CL 105  CO2 22  BUN 30*  CREATININE 1.16  GLUCOSE 222*  CALCIUM  7.9*   No results for input(s): "LABPT", "INR" in the last 72 hours.  Exam: General - Patient is Alert and Oriented Extremity - Neurologically intact Neurovascular intact Sensation intact distally Dorsiflexion/Plantar flexion intact Dressing/Incision - clean, dry, no drainage Motor Function - intact, moving foot and toes well on exam.   Past Medical History:  Diagnosis Date   Anticoagulant long-term use    Aortic root enlargement (HCC)    Ascending aortic aneurysm (HCC)    recent scan in October 2012 showing no change; followed by Dr. Nicanor Barge   ASCVD (arteriosclerotic cardiovascular disease)    Prior BMS to the 2nd OM in  September 2012; with repeat cath in October showing patency   CAD (coronary artery disease)    a. s/p BMS to 2nd OM in Sept 2012; b. LexiScan  Myoview  (12/2012):  Inf infarct; bowel and motion artifact make study difficult to interpret; no ischemia; not gated; Low Risk   CHF (congestive heart failure) (HCC)    no recent issues 10/13/14   Chronic back pain    "all over my back" (05/11/2017)   Colonic polyp    Contact lens/glasses fitting    COPD (chronic obstructive pulmonary disease) (HCC)    Diastolic dysfunction    DVT (deep venous thrombosis) (HCC)    ?LLE   GERD (gastroesophageal reflux disease)    Hearing loss    Hearing loss    more so on left   Hemorrhoids    History of stomach ulcers    Hypertension    IBS (irritable bowel syndrome)    LVH (left ventricular hypertrophy)    OA (osteoarthritis)    "all over" (05/11/2017)   Obesities, morbid (HCC)    OSA (obstructive sleep apnea)    PSG 03/30/97 AHI 21, BPAP 13/9   OSA on CPAP    PAF (paroxysmal atrial fibrillation) (HCC)    a. on Xarelto  b. s/p DCCV in 08/2016; b. Tikosyn  failed 04/16/17 with plans for Multaq  and possible Afib ablation with Dr. Nunzio Belch   Pneumonia    'several times" (05/11/2017)  Presence of Watchman left atrial appendage closure device 06/15/2022   24mm Watchman FLX Pro placed by Dr. Marven Slimmer   Prostate CA [facility])    Oncologist  DR. Cristal [facility] dx 09/24/14, undetermined tx   prostate; S/P "radiation and hormone injections"   Pulmonary embolism (HCC) 2008   "both lungs"   SOB (shortness of breath)    on excertion   Thoracic aortic aneurysm (HCC)    Aortic Size Index=     5.0    /Body surface area is 2.43 meters squared. = 2.05  < 2.75 cm/m2      4% risk per year 2.75 to 4.25          8% risk per year > 4.25 cm/m2    20% risk per year   Stable aneurysmal dilation of the ascending aorta with maximum AP diameter of 4.8 cm. Stable area of narrowing of the proximal most portion of the descending aorta measuring 2  cm., previously identified as an area of coarctation. No evidence of aortic dissection.  Coronary artery disease.  Normal appearance of the lungs.   Electronically Signed   By: Dobrinka  Dimitrova M.D.   On: 10/01/2014 08:50     Type II diabetes mellitus (HCC)    metphormin, average 154 dx 2017    Assessment/Plan: 2 Days Post-Op Procedure(s) (LRB): ARTHROPLASTY, HIP, TOTAL, ANTERIOR APPROACH (Left) Principal Problem:   OA (osteoarthritis) of hip Active Problems:   Primary osteoarthritis of left hip  Estimated body mass index is 38.35 kg/m as calculated from the following:   Height as of this encounter: 5' 5.5" (1.664 m).   Weight as of this encounter: 106.1 kg. Up with therapy  DVT Prophylaxis - Xarelto  Weight-bearing as tolerated  Possible discharge later today if ambulating well with therapy. Follow-up in the office in 2 weeks  The PDMP database was reviewed today prior to any opioid medications being prescribed to this patient.   Sharlynn Dear, PA-C Orthopedic Surgery [phone] 06/13/2023, 9:16 AM

## 2023-06-13 NOTE — Progress Notes (Signed)
 Physical Therapy Treatment Patient Details Name: Carlos Dawson MRN: [id number] DOB: Jun 09, 1940 Today's Date: 06/13/2023   History of Present Illness 83 yo male presents to therapy s/p L THA, anterior approach on 06/11/2023 due to failure of conservative measures. Pt PMH includes but is not limited to: COPD, A-fib, lung nodule, diverticulitis, asthma, R wrist CTS, anxiety, prostate ca, anemia, R THA (2020), IBS, HOH, GERD, DM II, s/p lumbar fusion, cervical spine surgery, PE, OA, OSA, CHF, thoracis aortic aneurysm, and watchman closure device L atrial appendage.    PT Comments  Pt with slow progress limited by surgical L hip pain and R hip pain and weakness.  Reports he can feel R hip "giving away."  Reports has had checked in past and has discussed with [name] during admission.  Pt ambulating 20' x 2 with RW and CGA at slow speed and low foot clearance. Pt not ready for home from a mobility standpoint.  Will continue POC.    If plan is discharge home, recommend the following: Assistance with cooking/housework;Help with stairs or ramp for entrance;Assist for transportation;A lot of help with walking and/or transfers;A lot of help with bathing/dressing/bathroom   Can travel by private vehicle        Equipment Recommendations  None recommended by PT    Recommendations for Other Services       Precautions / Restrictions Precautions Precautions: Fall Precaution/Restrictions Comments: Reports R (non-surgical) hip buckles Restrictions LLE Weight Bearing Per Provider Order: Weight bearing as tolerated     Mobility  Bed Mobility Overal bed mobility: Needs Assistance Bed Mobility: Sit to Supine     Supine to sit: HOB elevated, Mod assist Sit to supine: Mod assist, HOB elevated   General bed mobility comments: Increased time with assist for bil LE and repositioning    Transfers Overall transfer level: Needs assistance Equipment used: Rolling walker (2 wheels) Transfers: Sit  to/from Stand Sit to Stand: Min assist           General transfer comment: Performed x 2; min A and increased time to rise    Ambulation/Gait Ambulation/Gait assistance: Contact guard assist Gait Distance (Feet): 20 Feet (20'x2) Assistive device: Rolling walker (2 wheels) Gait Pattern/deviations: Step-to pattern, Decreased step length - left, Antalgic Gait velocity: decreased/non-functional/0.1 ft/sec Gait velocity interpretation: <1.8 ft/sec, indicate of risk for recurrent falls   General Gait Details: Heavy dependence on RW to offload.  Reports R LE had been giving out on him prior (had THA in 2020 -has followed up with imaging and everything normal). Very small steps with shuffle/no foot clearance   Stairs             Wheelchair Mobility     Tilt Bed    Modified Rankin (Stroke Patients Only)       Balance Overall balance assessment: History of Falls, Needs assistance Sitting-balance support: Feet supported Sitting balance-Leahy Scale: Good     Standing balance support: Bilateral upper extremity supported, During functional activity, Reliant on assistive device for balance Standing balance-Leahy Scale: Poor                              Communication    Cognition Arousal: Alert Behavior During Therapy: WFL for tasks assessed/performed   PT - Cognitive impairments: No apparent impairments  Cueing    Exercises Total Joint Exercises Ankle Circles/Pumps: AROM, Both, 10 reps, Supine Quad Sets: AROM, Both, 10 reps, Supine Heel Slides: AAROM, Both, 10 reps, Supine, Limitations Heel Slides Limitations: gait belt to assist Long Arc Quad: AROM, Both, 10 reps, Seated    General Comments General comments (skin integrity, edema, etc.): Pt reports mild lightheadedness after first bout of ambulation.  Noted to be mildly pale.  VS were stable.  BP 126/55 in sitting and 132/60 in standing. O2 sats 94%. HR  80's-90's      Pertinent Vitals/Pain Pain Assessment Pain Assessment: 0-10 Pain Score: 6  Pain Location: bil hip Pain Descriptors / Indicators: Discomfort, Grimacing Pain Intervention(s): Limited activity within patient's tolerance, Monitored during session, Premedicated before session, Repositioned, Ice applied    Home Living                          Prior Function            PT Goals (current goals can now be found in the care plan section) Progress towards PT goals: Progressing toward goals    Frequency    7X/week      PT Plan      Co-evaluation              AM-PAC PT "6 Clicks" Mobility   Outcome Measure  Help needed turning from your back to your side while in a flat bed without using bedrails?: A Lot Help needed moving from lying on your back to sitting on the side of a flat bed without using bedrails?: A Lot Help needed moving to and from a bed to a chair (including a wheelchair)?: A Little Help needed standing up from a chair using your arms (e.g., wheelchair or bedside chair)?: A Little Help needed to walk in hospital room?: A Little Help needed climbing 3-5 steps with a railing? : Total 6 Click Score: 14    End of Session Equipment Utilized During Treatment: Gait belt Activity Tolerance: Patient limited by pain Patient left: with call bell/phone within reach;in bed;with bed alarm set;with SCD's reapplied Nurse Communication: Mobility status PT Visit Diagnosis: Unsteadiness on feet (R26.81);Other abnormalities of gait and mobility (R26.89);Muscle weakness (generalized) (M62.81);History of falling (Z91.81);Difficulty in walking, not elsewhere classified (R26.2);Pain Pain - Right/Left: Left Pain - part of body: Hip;Leg     Time: 8295-6213 PT Time Calculation (min) (ACUTE ONLY): 30 min  Charges:    $Gait Training: 8-22 mins $Therapeutic Exercise: 8-22 mins PT General Charges $$ ACUTE PT VISIT: 1 Visit                     Cyd Dowse,  PT Acute Rehab Services [facility] [phone]    Carolynn Citrin 06/13/2023, 3:00 PM

## 2023-06-13 NOTE — Progress Notes (Signed)
   06/13/23 2243  BiPAP/CPAP/SIPAP  BiPAP/CPAP/SIPAP Pt Type Adult  BiPAP/CPAP/SIPAP DREAMSTATIOND  Mask Type Full face mask  Dentures removed? Not applicable  Mask Size Medium  FiO2 (%) 21 %  Patient Home Machine No  Patient Home Mask Yes  Patient Home Tubing Yes  Auto Titrate Yes  Minimum cmH2O 5 cmH2O  Maximum cmH2O 20 cmH2O  Device Plugged into [employer] Power Outlet Yes

## 2023-06-14 ENCOUNTER — Inpatient Hospital Stay (HOSPITAL_COMMUNITY)

## 2023-06-14 DIAGNOSIS — Y838 Other surgical procedures as the cause of abnormal reaction of the patient, or of later complication, without mention of misadventure at the time of the procedure: Secondary | ICD-10-CM | POA: Diagnosis not present

## 2023-06-14 DIAGNOSIS — E876 Hypokalemia: Secondary | ICD-10-CM | POA: Diagnosis not present

## 2023-06-14 DIAGNOSIS — Z7901 Long term (current) use of anticoagulants: Secondary | ICD-10-CM | POA: Diagnosis not present

## 2023-06-14 DIAGNOSIS — N179 Acute kidney failure, unspecified: Secondary | ICD-10-CM | POA: Diagnosis not present

## 2023-06-14 DIAGNOSIS — J4530 Mild persistent asthma, uncomplicated: Secondary | ICD-10-CM | POA: Diagnosis present

## 2023-06-14 DIAGNOSIS — I9589 Other hypotension: Secondary | ICD-10-CM | POA: Diagnosis not present

## 2023-06-14 DIAGNOSIS — I272 Pulmonary hypertension, unspecified: Secondary | ICD-10-CM | POA: Diagnosis present

## 2023-06-14 DIAGNOSIS — I2510 Atherosclerotic heart disease of native coronary artery without angina pectoris: Secondary | ICD-10-CM | POA: Diagnosis not present

## 2023-06-14 DIAGNOSIS — I7121 Aneurysm of the ascending aorta, without rupture: Secondary | ICD-10-CM | POA: Diagnosis present

## 2023-06-14 DIAGNOSIS — R410 Disorientation, unspecified: Secondary | ICD-10-CM | POA: Diagnosis not present

## 2023-06-14 DIAGNOSIS — I5033 Acute on chronic diastolic (congestive) heart failure: Secondary | ICD-10-CM | POA: Diagnosis not present

## 2023-06-14 DIAGNOSIS — I4819 Other persistent atrial fibrillation: Secondary | ICD-10-CM | POA: Diagnosis not present

## 2023-06-14 DIAGNOSIS — K56609 Unspecified intestinal obstruction, unspecified as to partial versus complete obstruction: Secondary | ICD-10-CM | POA: Diagnosis not present

## 2023-06-14 DIAGNOSIS — F05 Delirium due to known physiological condition: Secondary | ICD-10-CM | POA: Diagnosis not present

## 2023-06-14 DIAGNOSIS — I4891 Unspecified atrial fibrillation: Secondary | ICD-10-CM | POA: Diagnosis not present

## 2023-06-14 DIAGNOSIS — E1122 Type 2 diabetes mellitus with diabetic chronic kidney disease: Secondary | ICD-10-CM | POA: Diagnosis present

## 2023-06-14 DIAGNOSIS — I472 Ventricular tachycardia, unspecified: Secondary | ICD-10-CM | POA: Diagnosis not present

## 2023-06-14 DIAGNOSIS — I480 Paroxysmal atrial fibrillation: Secondary | ICD-10-CM | POA: Diagnosis not present

## 2023-06-14 DIAGNOSIS — R570 Cardiogenic shock: Secondary | ICD-10-CM | POA: Diagnosis not present

## 2023-06-14 DIAGNOSIS — Z515 Encounter for palliative care: Secondary | ICD-10-CM | POA: Diagnosis not present

## 2023-06-14 DIAGNOSIS — R579 Shock, unspecified: Secondary | ICD-10-CM | POA: Diagnosis not present

## 2023-06-14 DIAGNOSIS — N183 Chronic kidney disease, stage 3 unspecified: Secondary | ICD-10-CM | POA: Diagnosis present

## 2023-06-14 DIAGNOSIS — I959 Hypotension, unspecified: Secondary | ICD-10-CM | POA: Diagnosis not present

## 2023-06-14 DIAGNOSIS — Y92239 Unspecified place in hospital as the place of occurrence of the external cause: Secondary | ICD-10-CM | POA: Diagnosis not present

## 2023-06-14 DIAGNOSIS — Z6841 Body Mass Index (BMI) 40.0 and over, adult: Secondary | ICD-10-CM | POA: Diagnosis not present

## 2023-06-14 DIAGNOSIS — K567 Ileus, unspecified: Secondary | ICD-10-CM | POA: Diagnosis not present

## 2023-06-14 DIAGNOSIS — I4729 Other ventricular tachycardia: Secondary | ICD-10-CM | POA: Diagnosis not present

## 2023-06-14 DIAGNOSIS — Z96649 Presence of unspecified artificial hip joint: Secondary | ICD-10-CM

## 2023-06-14 DIAGNOSIS — K9189 Other postprocedural complications and disorders of digestive system: Secondary | ICD-10-CM | POA: Diagnosis not present

## 2023-06-14 DIAGNOSIS — Z66 Do not resuscitate: Secondary | ICD-10-CM | POA: Diagnosis present

## 2023-06-14 DIAGNOSIS — R197 Diarrhea, unspecified: Secondary | ICD-10-CM | POA: Diagnosis not present

## 2023-06-14 DIAGNOSIS — E872 Acidosis, unspecified: Secondary | ICD-10-CM | POA: Diagnosis present

## 2023-06-14 DIAGNOSIS — G928 Other toxic encephalopathy: Secondary | ICD-10-CM | POA: Diagnosis not present

## 2023-06-14 DIAGNOSIS — I130 Hypertensive heart and chronic kidney disease with heart failure and stage 1 through stage 4 chronic kidney disease, or unspecified chronic kidney disease: Secondary | ICD-10-CM | POA: Diagnosis present

## 2023-06-14 DIAGNOSIS — J690 Pneumonitis due to inhalation of food and vomit: Secondary | ICD-10-CM | POA: Diagnosis not present

## 2023-06-14 DIAGNOSIS — J9601 Acute respiratory failure with hypoxia: Secondary | ICD-10-CM | POA: Diagnosis not present

## 2023-06-14 DIAGNOSIS — M1612 Unilateral primary osteoarthritis, left hip: Secondary | ICD-10-CM | POA: Diagnosis present

## 2023-06-14 DIAGNOSIS — D539 Nutritional anemia, unspecified: Secondary | ICD-10-CM | POA: Diagnosis not present

## 2023-06-14 DIAGNOSIS — R000 Tachycardia, unspecified: Secondary | ICD-10-CM | POA: Diagnosis not present

## 2023-06-14 DIAGNOSIS — K560 Paralytic ileus: Secondary | ICD-10-CM | POA: Diagnosis not present

## 2023-06-14 DIAGNOSIS — I469 Cardiac arrest, cause unspecified: Secondary | ICD-10-CM | POA: Diagnosis not present

## 2023-06-14 DIAGNOSIS — J9602 Acute respiratory failure with hypercapnia: Secondary | ICD-10-CM | POA: Diagnosis not present

## 2023-06-14 LAB — BASIC METABOLIC PANEL WITH GFR
Anion gap: 6 (ref 5–15)
BUN: 31 mg/dL — ABNORMAL HIGH (ref 8–23)
CO2: 20 mmol/L — ABNORMAL LOW (ref 22–32)
Calcium: 8.5 mg/dL — ABNORMAL LOW (ref 8.9–10.3)
Chloride: 107 mmol/L (ref 98–111)
Creatinine, Ser: 0.82 mg/dL (ref 0.61–1.24)
GFR, Estimated: >60 mL/min (ref >60)
Glucose, Bld: 143 mg/dL — ABNORMAL HIGH (ref 70–99)
Potassium: 4.2 mmol/L (ref 3.5–5.1)
Sodium: 133 mmol/L — ABNORMAL LOW (ref 135–145)

## 2023-06-14 LAB — CBC
HCT: 25.7 % — ABNORMAL LOW (ref 39.0–52.0)
Hemoglobin: 8.2 g/dL — ABNORMAL LOW (ref 13.0–17.0)
MCH: 36.9 pg — ABNORMAL HIGH (ref 26.0–34.0)
MCHC: 31.9 g/dL (ref 30.0–36.0)
MCV: 115.8 fL — ABNORMAL HIGH (ref 80.0–100.0)
Platelets: 127 10*3/uL — ABNORMAL LOW (ref 150–400)
RBC: 2.22 MIL/uL — ABNORMAL LOW (ref 4.22–5.81)
RDW: 14.4 % (ref 11.5–15.5)
WBC: 6.3 10*3/uL (ref 4.0–10.5)
nRBC: 0.5 % — ABNORMAL HIGH (ref 0.0–0.2)

## 2023-06-14 LAB — GLUCOSE, CAPILLARY
Glucose-Capillary: 149 mg/dL — ABNORMAL HIGH (ref 70–99)
Glucose-Capillary: 162 mg/dL — ABNORMAL HIGH (ref 70–99)
Glucose-Capillary: 144 mg/dL — ABNORMAL HIGH (ref 70–99)
Glucose-Capillary: 167 mg/dL — ABNORMAL HIGH (ref 70–99)

## 2023-06-14 LAB — MAGNESIUM: Magnesium: 2.1 mg/dL (ref 1.7–2.4)

## 2023-06-14 LAB — MRSA NEXT GEN BY PCR, NASAL: MRSA by PCR Next Gen: NOT DETECTED

## 2023-06-14 MED ORDER — SODIUM CHLORIDE 0.9 % IV BOLUS
500.0000 mL | Freq: Once | INTRAVENOUS | Status: AC
  Administered 2023-06-14: 500 mL via INTRAVENOUS

## 2023-06-14 MED ORDER — ORAL CARE MOUTH RINSE: 15.0000 mL | OROMUCOSAL | Status: DC | PRN

## 2023-06-14 MED ORDER — AMIODARONE LOAD VIA INFUSION
150.0000 mg | Freq: Once | INTRAVENOUS | Status: AC
  Administered 2023-06-14: 150 mg via INTRAVENOUS
  Filled 2023-06-14: qty 83.34

## 2023-06-14 MED ORDER — METOPROLOL TARTRATE 12.5 MG HALF TABLET
12.5000 mg | ORAL_TABLET | Freq: Two times a day (BID) | ORAL | Status: DC
  Administered 2023-06-14: 12.5 mg via ORAL
  Filled 2023-06-14: qty 1

## 2023-06-14 MED ORDER — CHLORHEXIDINE GLUCONATE CLOTH 2 % EX PADS
6.0000 | MEDICATED_PAD | Freq: Every day | CUTANEOUS | Status: DC
  Administered 2023-06-14 – 2023-06-25 (×12): 6 via TOPICAL

## 2023-06-14 MED ORDER — AMIODARONE HCL IN DEXTROSE 360-4.14 MG/200ML-% IV SOLN
60.0000 mg/h | INTRAVENOUS | Status: DC
  Administered 2023-06-14: 60 mg/h via INTRAVENOUS
  Filled 2023-06-14 (×2): qty 200

## 2023-06-14 MED ORDER — AMIODARONE HCL IN DEXTROSE 360-4.14 MG/200ML-% IV SOLN
30.0000 mg/h | INTRAVENOUS | Status: DC
  Administered 2023-06-15: 30 mg/h via INTRAVENOUS

## 2023-06-14 NOTE — Progress Notes (Signed)
 Due to soft BP, Dr Jannette Mend instructed RN to hold amiodarone gtt and bolus until after NS bolus completed as long as afib controlled. HR currently maintaining in low 100's at this time. RN will continue to carefully monitor patient for changes in hemodynamic status.

## 2023-06-14 NOTE — Progress Notes (Signed)
 Physical Therapy Treatment Patient Details Name: Carlos Dawson MRN: [id number] DOB: [birth year] Today's Date: 06/14/2023   History of Present Illness 83 yo male presents to therapy s/p L THA, anterior approach on 06/11/2023 due to failure of conservative measures. Pt PMH includes but is not limited to: COPD, A-fib, lung nodule, diverticulitis, asthma, R wrist CTS, anxiety, prostate ca, anemia, R THA (2020), IBS, HOH, GERD, DM II, s/p lumbar fusion, cervical spine surgery, PE, OA, OSA, CHF, thoracis aortic aneurysm, and watchman closure device L atrial appendage.    PT Comments  Pt ambulated 22' with RW, distance limited by B hip pain and fatigue. After pt sat in recliner he became lightheaded. BP 116/75, HR variable from 105-141, SpO2 95% on room air. Assisted pt with L THA exercises. He is not progressing with mobility, he is not ready to DC home from a PT standpoint. RN and PA notified of HR. Pt reported he has a h/o afib but didn't know if he takes any medication for it.    If plan is discharge home, recommend the following: Assistance with cooking/housework;Help with stairs or ramp for entrance;Assist for transportation;A lot of help with walking and/or transfers;A lot of help with bathing/dressing/bathroom   Can travel by private vehicle        Equipment Recommendations  None recommended by PT    Recommendations for Other Services       Precautions / Restrictions Precautions Precautions: Fall Precaution/Restrictions Comments: Reports R (non-surgical) hip buckles and spasms Restrictions LLE Weight Bearing Per Provider Order: Weight bearing as tolerated     Mobility  Bed Mobility Overal bed mobility: Needs Assistance Bed Mobility: Supine to Sit     Supine to sit: Min assist, HOB elevated, Used rails     General bed mobility comments: Increased time, used gait belt as LLE lifter    Transfers Overall transfer level: Needs assistance Equipment used: Rolling walker (2  wheels) Transfers: Sit to/from Stand Sit to Stand: Min assist           General transfer comment: VCs for hand placement    Ambulation/Gait Ambulation/Gait assistance: Contact guard assist Gait Distance (Feet): 22 Feet Assistive device: Rolling walker (2 wheels) Gait Pattern/deviations: Step-to pattern, Decreased step length - left, Antalgic Gait velocity: decreased/non-functional/0.1 ft/sec     General Gait Details: Heavy dependence on RW to offload.  Reports R LE had been giving out on him prior (had THA in 2020 -has followed up with imaging and everything normal). Very small steps with shuffle/no foot clearance. Distance limited by pain/fatigue. Pt sat in recliner after walking, became lightheaded in sitting, HR varying from 105-141; BP 116/75, SpO2 95% on room air.   Stairs             Wheelchair Mobility     Tilt Bed    Modified Rankin (Stroke Patients Only)       Balance Overall balance assessment: History of Falls, Needs assistance Sitting-balance support: Feet supported Sitting balance-Leahy Scale: Good     Standing balance support: Bilateral upper extremity supported, During functional activity, Reliant on assistive device for balance Standing balance-Leahy Scale: Poor                              Communication Communication Communication: No apparent difficulties  Cognition Arousal: Alert Behavior During Therapy: WFL for tasks assessed/performed   PT - Cognitive impairments: No apparent impairments  Following commands: Intact      Cueing    Exercises Total Joint Exercises Ankle Circles/Pumps: AROM, Both, 10 reps, Supine Quad Sets: AROM, Both, Supine, 5 reps Short Arc Quad: AROM, 10 reps, Supine, Both Heel Slides: AAROM, 10 reps, Supine, Left Heel Slides Limitations: gait belt to assist Hip ABduction/ADduction: AROM, Left, 10 reps, Supine    General Comments        Pertinent Vitals/Pain  Pain Assessment Pain Score: 7  Pain Location: L hip with walking Pain Descriptors / Indicators: Discomfort, Grimacing Pain Intervention(s): Limited activity within patient's tolerance, Monitored during session, Premedicated before session, Ice applied    Home Living                          Prior Function            PT Goals (current goals can now be found in the care plan section) Acute Rehab PT Goals Patient Stated Goal: walk farther, be outdoors PT Goal Formulation: With patient/family Time For Goal Achievement: 07/14/2023 Potential to Achieve Goals: Fair Progress towards PT goals: Not progressing toward goals - comment    Frequency    7X/week      PT Plan      Co-evaluation              AM-PAC PT "6 Clicks" Mobility   Outcome Measure  Help needed turning from your back to your side while in a flat bed without using bedrails?: A Little Help needed moving from lying on your back to sitting on the side of a flat bed without using bedrails?: A Lot Help needed moving to and from a bed to a chair (including a wheelchair)?: A Little Help needed standing up from a chair using your arms (e.g., wheelchair or bedside chair)?: A Little Help needed to walk in hospital room?: A Little Help needed climbing 3-5 steps with a railing? : Total 6 Click Score: 15    End of Session Equipment Utilized During Treatment: Gait belt Activity Tolerance: Patient limited by pain;Treatment limited secondary to medical complications (Comment) Patient left: with call bell/phone within reach;in chair;with chair alarm set Nurse Communication: Mobility status PT Visit Diagnosis: Unsteadiness on feet (R26.81);Other abnormalities of gait and mobility (R26.89);Muscle weakness (generalized) (M62.81);History of falling (Z91.81);Difficulty in walking, not elsewhere classified (R26.2);Pain Pain - Right/Left: Left Pain - part of body: Hip;Leg     Time: 4098-1191 PT Time Calculation  (min) (ACUTE ONLY): 29 min  Charges:    $Gait Training: 8-22 mins $Therapeutic Exercise: 8-22 mins PT General Charges $$ ACUTE PT VISIT: 1 Visit                     Daymon Evans PT 06/14/2023  Acute Rehabilitation Services  Office [phone]

## 2023-06-14 NOTE — Progress Notes (Signed)
 MEWS Progress Note  Patient Details Name: Carlos Dawson MRN: [id number] DOB: March 16, 1940 Today's Date: 06/14/2023   MEWS Flowsheet Documentation:  Assess: MEWS Score Temp: 99.1 F (37.3 C) BP: (!) 122/58 MAP (mmHg): 76 Pulse Rate: (!) 123 ECG Heart Rate: 88 Resp: 16 Level of Consciousness: Alert SpO2: 98 % O2 Device: Room Air O2 Flow Rate (L/min): 2 L/min FiO2 (%): 21 % Assess: MEWS Score MEWS Temp: 0 MEWS Systolic: 0 MEWS Pulse: 2 MEWS RR: 0 MEWS LOC: 0 MEWS Score: 2 MEWS Score Color: Yellow Assess: SIRS CRITERIA SIRS Temperature : 0 SIRS Respirations : 0 SIRS Pulse: 1 SIRS WBC: 0 SIRS Score Sum : 1 SIRS Temperature : 0 SIRS Pulse: 1 SIRS Respirations : 0 SIRS WBC: 0 SIRS Score Sum : 1 Assess: if the MEWS score is Yellow or Red Were vital signs accurate and taken at a resting state?: Yes Does the patient meet 2 or more of the SIRS criteria?: No MEWS guidelines implemented : Yes, yellow Treat MEWS Interventions: Considered administering scheduled or prn medications/treatments as ordered Take Vital Signs Increase Vital Sign Frequency : Yellow: Q2hr x1, continue Q4hrs until patient remains green for 12hrs Escalate MEWS: Escalate: Yellow: Discuss with charge nurse and consider notifying provider and/or RRT Notify: Charge Nurse/RN Name of Charge Nurse/RN Notified: Lean,RN Provider Notification Provider Name/Title: Buddie Carina Date Provider Notified: 06/14/23 Method of Notification:  (securechat) Notification Reason: Other (Comment) Provider response: Other (Comment) (transfer pt)      Dekker Verga E Suri Tafolla 06/14/2023, 6:03 PM

## 2023-06-14 NOTE — Progress Notes (Signed)
 Physical Therapy Treatment Patient Details Name: Carlos Dawson MRN: [id number] DOB: 1940-07-11 Today's Date: 06/14/2023   History of Present Illness 83 yo male presents to therapy s/p L THA, anterior approach on 06/11/2023 due to failure of conservative measures. Pt PMH includes but is not limited to: COPD, A-fib, lung nodule, diverticulitis, asthma, R wrist CTS, anxiety, prostate ca, anemia, R THA (2020), IBS, HOH, GERD, DM II, s/p lumbar fusion, cervical spine surgery, PE, OA, OSA, CHF, thoracic aortic aneurysm, and watchman closure device L atrial appendage.    PT Comments  Pt ambulated 22' with RW, distance limited by R hip buckling, assisted pt to recliner where HR varied from 100-140. Pt performed L THA exercises with assistance.     If plan is discharge home, recommend the following: Assistance with cooking/housework;Help with stairs or ramp for entrance;Assist for transportation;A lot of help with walking and/or transfers;A lot of help with bathing/dressing/bathroom   Can travel by private vehicle        Equipment Recommendations  None recommended by PT    Recommendations for Other Services       Precautions / Restrictions Precautions Precautions: Fall Precaution/Restrictions Comments: Reports R (non-surgical) hip buckles and spasms Restrictions LLE Weight Bearing Per Provider Order: Weight bearing as tolerated     Mobility  Bed Mobility Overal bed mobility: Needs Assistance Bed Mobility: Supine to Sit     Supine to sit: Min assist, HOB elevated, Used rails     General bed mobility comments: Increased time, used gait belt as LLE lifter    Transfers Overall transfer level: Needs assistance Equipment used: Rolling walker (2 wheels) Transfers: Sit to/from Stand Sit to Stand: Min assist           General transfer comment: VCs for hand placement    Ambulation/Gait Ambulation/Gait assistance: Contact guard assist Gait Distance (Feet): 22 Feet Assistive  device: Rolling walker (2 wheels) Gait Pattern/deviations: Step-to pattern, Decreased step length - left, Antalgic Gait velocity: decreased/non-functional/0.1 ft/sec     General Gait Details: Heavy dependence on RW to offload.  Reports R LE had been giving out on him prior (had THA in 2020 -has followed up with imaging and everything normal). Very small steps with shuffle. Distance limited by R hip buckling. Pt sat in recliner after walking,, HR varying from 100-140   Stairs             Wheelchair Mobility     Tilt Bed    Modified Rankin (Stroke Patients Only)       Balance Overall balance assessment: History of Falls, Needs assistance Sitting-balance support: Feet supported Sitting balance-Leahy Scale: Good     Standing balance support: Bilateral upper extremity supported, During functional activity, Reliant on assistive device for balance Standing balance-Leahy Scale: Poor                              Communication Communication Communication: No apparent difficulties  Cognition Arousal: Alert Behavior During Therapy: WFL for tasks assessed/performed   PT - Cognitive impairments: No apparent impairments                         Following commands: Intact      Cueing    Exercises Total Joint Exercises Ankle Circles/Pumps: AROM, Both, 10 reps, Supine Quad Sets: AROM, Both, Supine, 5 reps Short Arc Quad: AROM, 10 reps, Supine, Both Heel Slides: AAROM, 10 reps, Supine, Left  Heel Slides Limitations: gait belt to assist Hip ABduction/ADduction: AROM, Left, 10 reps, Supine    General Comments        Pertinent Vitals/Pain Pain Assessment Pain Score: 7  Pain Location: L hip with walking Pain Descriptors / Indicators: Discomfort, Grimacing Pain Intervention(s): Limited activity within patient's tolerance, Monitored during session, Premedicated before session, Ice applied, Repositioned    Home Living                           Prior Function            PT Goals (current goals can now be found in the care plan section) Acute Rehab PT Goals Patient Stated Goal: walk farther, be outdoors PT Goal Formulation: With patient/family Time For Goal Achievement: 07/12/2023 Potential to Achieve Goals: Fair Progress towards PT goals: Progressing toward goals    Frequency    7X/week      PT Plan      Co-evaluation              AM-PAC PT "6 Clicks" Mobility   Outcome Measure  Help needed turning from your back to your side while in a flat bed without using bedrails?: A Little Help needed moving from lying on your back to sitting on the side of a flat bed without using bedrails?: A Lot Help needed moving to and from a bed to a chair (including a wheelchair)?: A Little Help needed standing up from a chair using your arms (e.g., wheelchair or bedside chair)?: A Little Help needed to walk in hospital room?: A Little Help needed climbing 3-5 steps with a railing? : Total 6 Click Score: 15    End of Session Equipment Utilized During Treatment: Gait belt Activity Tolerance: Patient limited by pain;Treatment limited secondary to medical complications (Comment) Patient left: with call bell/phone within reach;in chair;with chair alarm set Nurse Communication: Mobility status PT Visit Diagnosis: Unsteadiness on feet (R26.81);Other abnormalities of gait and mobility (R26.89);Muscle weakness (generalized) (M62.81);History of falling (Z91.81);Difficulty in walking, not elsewhere classified (R26.2);Pain Pain - Right/Left: Left Pain - part of body: Hip;Leg     Time: 1610-9604 PT Time Calculation (min) (ACUTE ONLY): 26 min  Charges:    $Gait Training: 8-22 mins $Therapeutic Exercise: 8-22 mins PT General Charges $$ ACUTE PT VISIT: 1 Visit                     Daymon Evans PT 06/14/2023  Acute Rehabilitation Services  Office [phone]

## 2023-06-14 NOTE — Plan of Care (Signed)
  Problem: Education: Goal: Knowledge of General Education information will improve Description: Including pain rating scale, medication(s)/side effects and non-pharmacologic comfort measures Outcome: Progressing   Problem: Health Behavior/Discharge Planning: Goal: Ability to manage health-related needs will improve Outcome: Progressing   Problem: Clinical Measurements: Goal: Ability to maintain clinical measurements within normal limits will improve Outcome: Progressing Goal: Will remain free from infection Outcome: Progressing Goal: Diagnostic test results will improve Outcome: Progressing Goal: Respiratory complications will improve Outcome: Progressing Goal: Cardiovascular complication will be avoided Outcome: Progressing   Problem: Activity: Goal: Risk for activity intolerance will decrease Outcome: Adequate for Discharge   Problem: Coping: Goal: Level of anxiety will decrease Outcome: Progressing   Problem: Pain Managment: Goal: General experience of comfort will improve and/or be controlled Outcome: Progressing   Problem: Safety: Goal: Ability to remain free from injury will improve Outcome: Progressing   Problem: Skin Integrity: Goal: Risk for impaired skin integrity will decrease Outcome: Progressing   Problem: Education: Goal: Knowledge of General Education information will improve Description: Including pain rating scale, medication(s)/side effects and non-pharmacologic comfort measures Outcome: Adequate for Discharge   Problem: Health Behavior/Discharge Planning: Goal: Ability to manage health-related needs will improve Outcome: Progressing   Problem: Clinical Measurements: Goal: Ability to maintain clinical measurements within normal limits will improve Outcome: Adequate for Discharge Goal: Will remain free from infection Outcome: Progressing Goal: Diagnostic test results will improve Outcome: Progressing   Problem: Elimination: Goal: Will not  experience complications related to bowel motility Outcome: Completed/Met Goal: Will not experience complications related to urinary retention Outcome: Completed/Met   Problem: Education: Goal: Knowledge of the prescribed therapeutic regimen will improve Outcome: Adequate for Discharge Goal: Understanding of discharge needs will improve Outcome: Progressing   Problem: Activity: Goal: Ability to avoid complications of mobility impairment will improve Outcome: Progressing Goal: Ability to tolerate increased activity will improve Outcome: Progressing   Problem: Clinical Measurements: Goal: Postoperative complications will be avoided or minimized Outcome: Progressing   Problem: Pain Management: Goal: Pain level will decrease with appropriate interventions Outcome: Adequate for Discharge   Problem: Skin Integrity: Goal: Will show signs of wound healing Outcome: Progressing   Problem: Tissue Perfusion: Goal: Adequacy of tissue perfusion will improve Outcome: Adequate for Discharge

## 2023-06-14 NOTE — Progress Notes (Signed)
 Subjective: 3 Days Post-Op Procedure(s) (LRB): ARTHROPLASTY, HIP, TOTAL, ANTERIOR APPROACH (Left) Patient seen in rounds by Dr. France Ina. Pain improving with time. No issues overnight.  Objective: Vital signs in last 24 hours: Temp:  [98 F (36.7 C)-98.4 F (36.9 C)] 98.4 F (36.9 C) (05/22 0500) Pulse Rate:  [59-100] 59 (05/22 0500) Resp:  [17] 17 (05/22 0500) BP: (94-108)/(51-61) 108/61 (05/22 0500) SpO2:  [88 %-95 %] 88 % (05/22 0500) FiO2 (%):  [21 %] 21 % (05/21 2243)  Intake/Output from previous day:  Intake/Output Summary (Last 24 hours) at 06/14/2023 0745 Last data filed at 06/14/2023 0545 Gross per 24 hour  Intake 1246.42 ml  Output 2000 ml  Net -753.58 ml    Intake/Output this shift: No intake/output data recorded.  Labs: Recent Labs    06/12/23 0317 06/13/23 0316  HGB 8.9* 8.1*   Recent Labs    06/12/23 0317 06/13/23 0316  WBC 10.0 6.7  RBC 2.46* 2.23*  HCT 27.5* 24.8*  PLT 150 125*   Recent Labs    06/12/23 0317  NA 135  K 4.5  CL 105  CO2 22  BUN 30*  CREATININE 1.16  GLUCOSE 222*  CALCIUM  7.9*   No results for input(s): "LABPT", "INR" in the last 72 hours.  Exam: General - Patient is Alert and Oriented Extremity - Neurologically intact Neurovascular intact Sensation intact distally Dorsiflexion/Plantar flexion intact Dressing/Incision - clean, dry, no drainage Motor Function - intact, moving foot and toes well on exam.  Past Medical History:  Diagnosis Date   Anticoagulant long-term use    Aortic root enlargement (HCC)    Ascending aortic aneurysm (HCC)    recent scan in October 2012 showing no change; followed by Dr. Nicanor Barge   ASCVD (arteriosclerotic cardiovascular disease)    Prior BMS to the 2nd OM in September 2012; with repeat cath in October showing patency   CAD (coronary artery disease)    a. s/p BMS to 2nd OM in Sept 2012; b. LexiScan  Myoview  (12/2012):  Inf infarct; bowel and motion artifact make study difficult  to interpret; no ischemia; not gated; Low Risk   CHF (congestive heart failure) (HCC)    no recent issues 10/13/14   Chronic back pain    "all over my back" (05/11/2017)   Colonic polyp    Contact lens/glasses fitting    COPD (chronic obstructive pulmonary disease) (HCC)    Diastolic dysfunction    DVT (deep venous thrombosis) (HCC)    ?LLE   GERD (gastroesophageal reflux disease)    Hearing loss    Hearing loss    more so on left   Hemorrhoids    History of stomach ulcers    Hypertension    IBS (irritable bowel syndrome)    LVH (left ventricular hypertrophy)    OA (osteoarthritis)    "all over" (05/11/2017)   Obesities, morbid (HCC)    OSA (obstructive sleep apnea)    PSG 03/30/97 AHI 21, BPAP 13/9   OSA on CPAP    PAF (paroxysmal atrial fibrillation) (HCC)    a. on Xarelto  b. s/p DCCV in 08/2016; b. Tikosyn  failed 04/16/17 with plans for Multaq  and possible Afib ablation with Dr. Nunzio Belch   Pneumonia    'several times" (05/11/2017)   Presence of Watchman left atrial appendage closure device 06/15/2022   24mm Watchman FLX Pro placed by Dr. Marven Slimmer   Prostate CA [facility])    Oncologist  DR. Cristal Don [facility] dx 09/24/14, undetermined tx   prostate;  S/P "radiation and hormone injections"   Pulmonary embolism (HCC) 2008   "both lungs"   SOB (shortness of breath)    on excertion   Thoracic aortic aneurysm (HCC)    Aortic Size Index=     5.0    /Body surface area is 2.43 meters squared. = 2.05  < 2.75 cm/m2      4% risk per year 2.75 to 4.25          8% risk per year > 4.25 cm/m2    20% risk per year   Stable aneurysmal dilation of the ascending aorta with maximum AP diameter of 4.8 cm. Stable area of narrowing of the proximal most portion of the descending aorta measuring 2 cm., previously identified as an area of coarctation. No evidence of aortic dissection.  Coronary artery disease.  Normal appearance of the lungs.   Electronically Signed   By: Dobrinka  Dimitrova M.D.   On: 10/01/2014 08:50      Type II diabetes mellitus (HCC)    metphormin, average 154 dx 2017    Assessment/Plan: 3 Days Post-Op Procedure(s) (LRB): ARTHROPLASTY, HIP, TOTAL, ANTERIOR APPROACH (Left) Principal Problem:   OA (osteoarthritis) of hip Active Problems:   Primary osteoarthritis of left hip  Estimated body mass index is 38.35 kg/m as calculated from the following:   Height as of this encounter: 5' 5.5" (1.664 m).   Weight as of this encounter: 106.1 kg.  DVT Prophylaxis - Xarelto  Weight-bearing as tolerated.  Hopeful to discharge home today pending progress with physical therapy and pain control.  R. Brinton Canavan, PA-C Orthopedic Surgery 06/14/2023, 7:45 AM

## 2023-06-14 NOTE — Progress Notes (Signed)
 Patient is dyspneic w/ exertion.HR spikes to 170 + when ambulating. At rest HR is 110-130. MD aware and put in orders for transfer to stepdown unit due to afib w/ rvr and need for amiodarone drip. Spoke with Nathan,RN (Rapid) and gave narrative of patients current situation. Successfully gave report to Hope,RN (receiving nurse).

## 2023-06-14 NOTE — Consult Note (Addendum)
 [facility] Initial Consultation Note  Carlos Dawson:[id number] DOB: 06/11/1940 DOA: 06/11/2023  PCP: Tena Feeling, MD   Requesting Physician: Dr. Rossie Coon via Perla Bradford, PA-C   Reason for Consultation: Medical Management of hypotension and tachycardia  HPI: Carlos Dawson is a 83 y.o. male with medical history significant for CAD, diastolic CHF, COPD on room air, history of DVT, paroxysmal atrial fibrillation status post Watchman procedure, no longer on anticoagulation who was admitted to the hospital by the orthopedic service on 5/19 after successful left hip total arthroplasty who is now being seen in consultation due to concern for hypotension, tachycardia and dyspnea with exertion.  I had the opportunity to speak extensively with the patient as well as his wife at the bedside this afternoon.  They tell me that recent medication adjustments were made by his cardiologist Dr. [name], the patient is overall been doing well, though he has been having some asymptomatic episodes of atrial fibrillation which the patient has been documenting on his Apple Watch.  Since surgery, patient has been doing well overall, however he has been feeling quite weak, dizzy and lightheaded when trying to work with therapy.  He denies any chest pain, or palpitations.  Blood pressure has been low, and he has been noted to have some tachycardia, this prompted hospitalist consultation.  He denies any significant cough, fevers, chills, nausea, vomiting.  Wife states that he eats breakfast well each morning in the hospital, but there have been a few evenings where he did not even order dinner.  While he does not take any anticoagulation at home now chronically, he was started on Xarelto  on 5/20 for postoperative DVT prophylaxis, given his prior history of DVT.  Review of Systems: Please see HPI for pertinent positives and negatives. A complete 10 system review of systems are otherwise negative.  Past  Medical History:  Diagnosis Date   Anticoagulant long-term use    Aortic root enlargement (HCC)    Ascending aortic aneurysm (HCC)    recent scan in October 2012 showing no change; followed by Dr. Nicanor Barge   ASCVD (arteriosclerotic cardiovascular disease)    Prior BMS to the 2nd OM in September 2012; with repeat cath in October showing patency   CAD (coronary artery disease)    a. s/p BMS to 2nd OM in Sept 2012; b. LexiScan  Myoview  (12/2012):  Inf infarct; bowel and motion artifact make study difficult to interpret; no ischemia; not gated; Low Risk   CHF (congestive heart failure) (HCC)    no recent issues 10/13/14   Chronic back pain    "all over my back" (05/11/2017)   Colonic polyp    Contact lens/glasses fitting    COPD (chronic obstructive pulmonary disease) (HCC)    Diastolic dysfunction    DVT (deep venous thrombosis) (HCC)    ?LLE   GERD (gastroesophageal reflux disease)    Hearing loss    Hearing loss    more so on left   Hemorrhoids    History of stomach ulcers    Hypertension    IBS (irritable bowel syndrome)    LVH (left ventricular hypertrophy)    OA (osteoarthritis)    "all over" (05/11/2017)   Obesities, morbid (HCC)    OSA (obstructive sleep apnea)    PSG 03/30/97 AHI 21, BPAP 13/9   OSA on CPAP    PAF (paroxysmal atrial fibrillation) (HCC)    a. on Xarelto  b. s/p DCCV in 08/2016; b. Tikosyn  failed 04/16/17 with plans  for Multaq  and possible Afib ablation with Dr. Nunzio Belch   Pneumonia    'several times" (05/11/2017)   Presence of Watchman left atrial appendage closure device 06/15/2022   24mm Watchman FLX Pro placed by Dr. Marven Slimmer   Prostate CA [facility])    Oncologist  DR. Cristal Don [facility] dx 09/24/14, undetermined tx   prostate; S/P "radiation and hormone injections"   Pulmonary embolism (HCC) 2008   "both lungs"   SOB (shortness of breath)    on excertion   Thoracic aortic aneurysm (HCC)    Aortic Size Index=     5.0    /Body surface area is 2.43 meters squared. =  2.05  < 2.75 cm/m2      4% risk per year 2.75 to 4.25          8% risk per year > 4.25 cm/m2    20% risk per year   Stable aneurysmal dilation of the ascending aorta with maximum AP diameter of 4.8 cm. Stable area of narrowing of the proximal most portion of the descending aorta measuring 2 cm., previously identified as an area of coarctation. No evidence of aortic dissection.  Coronary artery disease.  Normal appearance of the lungs.   Electronically Signed   By: Dobrinka  Dimitrova M.D.   On: 10/01/2014 08:50     Type II diabetes mellitus (HCC)    metphormin, average 154 dx 2017   Past Surgical History:  Procedure Laterality Date   ACHILLES TENDON REPAIR Bilateral    AORTIC ARCH ANGIOGRAPHY N/A 03/13/2017   Procedure: AORTIC ARCH ANGIOGRAPHY;  Surgeon: [name], [name], MD;  Location: [facility] INVASIVE CV LAB;  Service: Cardiovascular;  Laterality: N/A;   APPENDECTOMY     ATRIAL FIBRILLATION ABLATION  05/11/2017   ATRIAL FIBRILLATION ABLATION N/A 05/11/2017   Procedure: ATRIAL FIBRILLATION ABLATION;  Surgeon: Jolly Needle, MD;  Location: [facility] INVASIVE CV LAB;  Service: Cardiovascular;  Laterality: N/A;   BACK SURGERY     "I've had 7 back and 1 neck ORs" (05/11/2017)   BIOPSY  03/14/2018   Procedure: BIOPSY;  Surgeon: Genell Ken, MD;  Location: [facility] ENDOSCOPY;  Service: Gastroenterology;;  EGD and Colon   CARDIAC CATHETERIZATION  2006   CARPAL TUNNEL RELEASE Bilateral    LEFT   CATARACT EXTRACTION W/ INTRAOCULAR LENS  IMPLANT, BILATERAL Bilateral    CERVICAL SPINE SURGERY  06/02/2010   lower back and neck   COLONOSCOPY N/A 03/14/2018   Procedure: COLONOSCOPY;  Surgeon: Genell Ken, MD;  Location: [facility] ENDOSCOPY;  Service: Gastroenterology;  Laterality: N/A;   COLONOSCOPY WITH PROPOFOL  N/A 12/29/2014   Procedure: COLONOSCOPY WITH PROPOFOL ;  Surgeon: Garrett Kallman, MD;  Location: [facility] ENDOSCOPY;  Service: Endoscopy;  Laterality: N/A;   CORONARY ANGIOPLASTY WITH STENT PLACEMENT  October 2012   CORONARY  STENT PLACEMENT  Sept 2012   2nd OM with BMS   ESOPHAGOGASTRODUODENOSCOPY N/A 03/14/2018   Procedure: ESOPHAGOGASTRODUODENOSCOPY (EGD);  Surgeon: Genell Ken, MD;  Location: [facility];  Service: Gastroenterology;  Laterality: N/A;   HEMORROIDECTOMY     LAMINECTOMY  05/30/2012   L 4 L5   LAMINECTOMY WITH POSTERIOR LATERAL ARTHRODESIS LEVEL 3 N/A 10/18/2016   Procedure: Posterior Lateral Fusion - Lumbar One-Four, segmental instrumentation Lumbar One-Five,  decompression,;  Surgeon: Isadora Mar, MD;  Location: [facility] OR;  Service: Neurosurgery;  Laterality: N/A;   LAPAROSCOPIC CHOLECYSTECTOMY     LAPAROSCOPIC GASTRIC BANDING     LEFT AND RIGHT HEART CATHETERIZATION WITH CORONARY ANGIOGRAM N/A 05/07/2014  Procedure: LEFT AND RIGHT HEART CATHETERIZATION WITH CORONARY ANGIOGRAM;  Surgeon: [name] [name], MD;  Location: [facility] CATH LAB;  Service: Cardiovascular;  Laterality: N/A;   LEFT ATRIAL APPENDAGE OCCLUSION N/A 06/15/2022   Procedure: LEFT ATRIAL APPENDAGE OCCLUSION;  Surgeon: Boyce Byes, MD;  Location: [facility] INVASIVE CV LAB;  Service: Cardiovascular;  Laterality: N/A;   LEFT HEART CATH AND CORONARY ANGIOGRAPHY N/A 03/13/2017   Procedure: LEFT HEART CATH AND CORONARY ANGIOGRAPHY;  Surgeon: [name], [name], MD;  Location: [facility] INVASIVE CV LAB;  Service: Cardiovascular;  Laterality: N/A;   LUMBAR LAMINECTOMY/DECOMPRESSION MICRODISCECTOMY N/A 05/04/2016   Procedure: Laminectomy and Foraminotomy - Thoracic twelve-Lumbar one -Posterior Fusion Lumbar one-two;  Surgeon: Isadora Mar, MD;  Location: [facility] OR;  Service: Neurosurgery;  Laterality: N/A;   POLYPECTOMY  03/14/2018   Procedure: POLYPECTOMY;  Surgeon: Genell Ken, MD;  Location: [facility] ENDOSCOPY;  Service: Gastroenterology;;   POSTERIOR LUMBAR FUSION  10/18/2016   SHOULDER OPEN ROTATOR CUFF REPAIR Bilateral    TEE WITHOUT CARDIOVERSION N/A 06/15/2022   Procedure: TRANSESOPHAGEAL ECHOCARDIOGRAM;  Surgeon: Boyce Byes, MD;  Location: [facility] INVASIVE CV  LAB;  Service: Cardiovascular;  Laterality: N/A;   TONSILLECTOMY AND ADENOIDECTOMY     TOTAL HIP ARTHROPLASTY Right 10/18/2018   Procedure: RIGHT TOTAL HIP ARTHROPLASTY ANTERIOR APPROACH;  Surgeon: Arnie Lao, MD;  Location: [facility] ORS;  Service: Orthopedics;  Laterality: Right;   TOTAL HIP ARTHROPLASTY Left 06/11/2023   Procedure: ARTHROPLASTY, HIP, TOTAL, ANTERIOR APPROACH;  Surgeon: Liliane Rei, MD;  Location: [facility] ORS;  Service: Orthopedics;  Laterality: Left;   TRIGGER FINGER RELEASE     LEFT   UVULOPALATOPHARYNGOPLASTY     VASECTOMY      Social History:  reports that he quit smoking about 31 years ago. His smoking use included cigarettes. He started smoking about 67 years ago. He has a 54 pack-year smoking history. He has never used smokeless tobacco. He reports that he does not drink alcohol and does not use drugs.  Allergies  Allergen Reactions   Lotensin [Benazepril] Cough   Tofranil [Imipramine]     Weight gain   Ace Inhibitors Cough   Adhesive [Tape] Itching and Rash   Amoxicillin -Pot Clavulanate Other (See Comments)    GI Upset (intolerance)   Codeine Nausea Only   Latex Itching, Rash and Other (See Comments)    Bandaids   Metformin  Diarrhea   Morphine Itching   Nifedipine Swelling     leg edema   Quinolones Other (See Comments)    Patient was warned about not using Cipro and similar antibiotics. Recent studies have raised concern that fluoroquinolone antibiotics could be associated with an increased risk of aortic aneurysm Fluoroquinolones have non-antimicrobial properties that might jeopardise the integrity of the extracellular matrix of the vascular wall In a  propensity score matched cohort study in [place], there was a 66% increased rate of aortic aneurysm or dissection associated with oral fluoroquinolone use, compared wit    Family History  Problem Relation Age of Onset   Heart disease Mother    Diabetes Mother    Other Mother        stent placement    Emphysema Father 78   Heart attack Sister      Prior to Admission medications   Medication Sig Start Date End Date Taking? Authorizing Provider  acetaminophen  (TYLENOL ) 500 MG tablet Take 1,000 mg by mouth every 8 (eight) hours as needed for mild pain or headache.    Yes [provider]  albuterol  (VENTOLIN  HFA) 108 (90 Base) MCG/ACT inhaler Inhale 2 puffs into the lungs every 4 (four) hours as needed for wheezing or shortness of breath. 10/30/22  Yes Parrett, Tammy S, NP  aspirin  EC 81 MG tablet Take 1 tablet (81 mg total) by mouth daily. Swallow whole. 06/15/22 06/15/23 Yes McDaniel, Jill D, NP  budesonide -formoterol  (SYMBICORT ) 160-4.5 MCG/ACT inhaler Inhale 2 puffs into the lungs daily as needed (Asthma). Patient taking differently: Inhale 2 puffs into the lungs 2 (two) times daily as needed (Asthma). 10/30/22  Yes Parrett, Tammy S, NP  celecoxib  (CELEBREX ) 200 MG capsule Take 200 mg by mouth daily as needed for moderate pain (pain score 4-6). 02/23/23  Yes [provider]  cetirizine (ZYRTEC) 10 MG tablet Take 10 mg by mouth daily.   Yes [provider]  Cholecalciferol  (VITAMIN D3 MAXIMUM STRENGTH) 125 MCG (5000 UT) capsule Take 5,000 Units by mouth daily.   Yes [provider]  Ferrous Sulfate Dried (FEOSOL) 200 (65 Fe) MG TABS Take 1 tablet by mouth every other day.   Yes [provider]  furosemide  (LASIX ) 40 MG tablet TAKE ONE TABLET (40MG ) BY MOUTH EACH DAY AS NEEDED FOR SWELLING OR WEIGHT GAIN. Patient taking differently: Take 40 mg by mouth daily. May take a second 40 mg dose as needed for swelling 03/20/22  Yes [name], [name], MD  JARDIANCE  25 MG TABS tablet Take 25 mg by mouth daily. 04/18/18  Yes [provider]  losartan  (COZAAR ) 25 MG tablet Take 1 tablet (25 mg total) by mouth daily. 03/20/23  Yes [name], [name], MD  pantoprazole  (PROTONIX ) 40 MG tablet TAKE ONE TABLET BY MOUTH BEFORE BREAKFAST (TAKE ON AN EMPTY STOMACH 30  MINUTES PRIOR TO A MEAL) 03/09/21  Yes [provider]  potassium chloride  SA (KLOR-CON  M20) 20 MEQ tablet Taking two tablet by mouth in the am and 1 tablet in the evening 09/22/22  Yes [name], [name], MD  pregabalin (LYRICA) 50 MG capsule Take 50 mg by mouth at bedtime as needed (pain). 02/23/23  Yes [provider]  psyllium (METAMUCIL) 58.6 % packet Take 1 packet by mouth daily as needed (Looose stool).   Yes [provider]  spironolactone  (ALDACTONE ) 25 MG tablet TAKE 1 TABLET (25 MG TOTAL) BY MOUTH DAILY. 05/24/23  Yes [name], [name], MD  tamsulosin  (FLOMAX ) 0.4 MG CAPS Take 0.4 mg by mouth every evening.    Yes [provider]  Tiotropium Bromide  Monohydrate (SPIRIVA  RESPIMAT) 2.5 MCG/ACT AERS Inhale 2 puffs into the lungs daily as needed (shortness of breath).   Yes [provider]  topiramate  (TOPAMAX ) 25 MG capsule Take 50 mg by mouth 2 (two) times daily.    Yes [provider]  umeclidinium bromide  (INCRUSE ELLIPTA ) 62.5 MCG/ACT AEPB Inhale 1 puff into the lungs daily. 03/20/23  Yes Parrett, Tammy S, NP  cyclobenzaprine  (FLEXERIL ) 10 MG tablet Take 1 tablet (10 mg total) by mouth 3 (three) times daily as needed for muscle spasms. 06/13/23   Edmisten, Kristie L, PA  furosemide  (LASIX ) 80 MG tablet Take 0.5 tablets (40 mg total) by mouth daily. Patient not taking: Reported on 05/22/2023 02/27/23 05/28/23  [name], [name], MD  HYDROcodone -acetaminophen  (NORCO/VICODIN) 5-325 MG tablet Take 1-2 tablets by mouth every 6 (six) hours as needed for severe pain (pain score 7-10). 06/13/23   Edmisten, Kristie L, PA  ipratropium (ATROVENT ) 0.03 % nasal spray Place 2 sprays into both nostrils every 12 (twelve) hours.  Patient not taking: Reported on 05/22/2023 03/02/23   Parrett, Macdonald Savoy, NP  nitroGLYCERIN  (NITROSTAT ) 0.4 MG SL tablet Place 1 tablet (0.4 mg total) under the tongue every 5 (five) minutes as needed for chest pain. 09/08/21   [name], [name], MD   ondansetron  (ZOFRAN ) 4 MG tablet Take 1 tablet (4 mg total) by mouth every 6 (six) hours as needed for nausea. 06/13/23   Edmisten, Onesimo Bijou, PA  Polyethyl Glycol-Propyl Glycol (SYSTANE OP) Place 1 drop into both eyes daily as needed (for dry eyes).     [provider]  rivaroxaban  (XARELTO ) 10 MG TABS tablet Take 1 tablet (10 mg total) by mouth daily with breakfast for 19 days. Then resume one 81 mg aspirin  once a day 06/14/23 07/03/23  Edmisten, Kristie L, PA  sildenafil  (VIAGRA ) 100 MG tablet Take 1 tablet (100 mg total) by mouth as needed for erectile dysfunction. 07/13/20   [name], [name], MD  Skin Protectants, Misc. (EUCERIN) cream Apply 1 application topically as needed for dry skin.    [provider]    Physical Exam: BP (!) 91/57 (BP Location: Right Arm)   Pulse 93   Temp 98.4 F (36.9 C) (Oral)   Resp 20   Ht 5' 5.5" (1.664 m)   Wt 106.1 kg   SpO2 97%   BMI 38.35 kg/m  General:  Alert, oriented, calm, in no acute distress, sitting up in a chair at the bedside, his wife is at the bedside as well.  Resting comfortably on room air. Cardiovascular: RRR, no murmurs or rubs, no peripheral edema  Respiratory: Breath sounds are distant but clear to auscultation bilaterally, no wheezes, no crackles  Abdomen: soft, nontender, slightly distended  Skin: dry, no rashes  Musculoskeletal: no joint effusions, normal range of motion  Psychiatric: appropriate affect, normal speech  Neurologic: extraocular muscles intact, clear speech, moving all extremities with intact sensorium         Recent Labs and Imaging Reviewed:  Basic Metabolic Panel: Recent Labs  Lab 06/12/23 0317  NA 135  K 4.5  CL 105  CO2 22  GLUCOSE 222*  BUN 30*  CREATININE 1.16  CALCIUM  7.9*   Liver Function Tests: No results for input(s): "AST", "ALT", "ALKPHOS", "BILITOT", "PROT", "ALBUMIN " in the last 168 hours. No results for input(s): "LIPASE", "AMYLASE" in the last 168 hours. No results  for input(s): "AMMONIA" in the last 168 hours. CBC: Recent Labs  Lab 06/12/23 0317 06/13/23 0316 06/14/23 0654  WBC 10.0 6.7 6.3  HGB 8.9* 8.1* 8.2*  HCT 27.5* 24.8* 25.7*  MCV 111.8* 111.2* 115.8*  PLT 150 125* 127*   Cardiac Enzymes: No results for input(s): "CKTOTAL", "CKMB", "CKMBINDEX", "TROPONINI" in the last 168 hours.  BNP (last 3 results) No results for input(s): "BNP" in the last 8760 hours.  ProBNP (last 3 results) No results for input(s): "PROBNP" in the last 8760 hours.  CBG: Recent Labs  Lab 06/13/23 1200 06/13/23 1642 06/13/23 2050 06/14/23 0802 06/14/23 1205  GLUCAP 160* 157* 209* 149* 162*    Radiological Exams on Admission: No results found.  Summary and Recommendations: Carlos Dawson is a 83 y.o. male with medical history significant for CAD, diastolic CHF, COPD on room air, history of DVT, paroxysmal atrial fibrillation status post Watchman procedure, no longer on chronic anticoagulation who was admitted to the hospital by the orthopedic service on 5/19 after successful left hip total arthroplasty who is now being seen in consultation due to concern  for hypotension, tachycardia and dyspnea with exertion.  The patient's hypotension, tachycardia and dyspnea exertion are likely multifactorial in nature.  Patient was noted to be quite hypotensive when he was seen last in cardiology clinic in February of this year, during which time his losartan  and Metoprolol  were discontinued.  His Lasix  was reduced to 40 mg p.o. daily.  Losartan  and Aldactone  have been continued in the hospital.  Contributing factors also probably include his acute on chronic postoperative anemia, as well as mild dehydration, as he has not been very well since his surgery.  Since he has been anticoagulated since his surgery, PE is much less likely.  He has no chest pain, and has not been hypoxic other than early this morning when he was sleeping.  Recommendations: -Placed on cardiac  telemetry -EKG obtained this afternoon, and personally reviewed, he appears to be in Afib rate 101 but rates as high as 170 with exertion -Discontinue losartan  and Aldactone  due to hypotension -Resume low-dose metoprolol  for rate control, titrate as indicated -Will give 500 cc normal saline over 4 hours -Will obtain 1 view chest x-ray to rule out consolidation or effusion -Due to symptomatic A-fib with RVR, will transfer him to stepdown unit and start him on amiodarone bolus and drip (diltiazem contraindicated due to hypotension) -Incentive spirometer and flutter valve -Continue CPAP at night - Would continue to monitor hemoglobin with daily labs. -Will check stat BMP and magnesium   Thank you for involving us  in the care of your patient.  [facility] will continue to follow along with you.    Code Status: Full Code  Time spent: 59 minutes  Taleigh Gero Rickey Charm MD [facility] Pager [phone]  If 7PM-7AM, please contact night-coverage [email or web address] Password [facility]  06/14/2023, 3:23 PM

## 2023-06-15 ENCOUNTER — Inpatient Hospital Stay (HOSPITAL_COMMUNITY)

## 2023-06-15 DIAGNOSIS — I2510 Atherosclerotic heart disease of native coronary artery without angina pectoris: Secondary | ICD-10-CM

## 2023-06-15 DIAGNOSIS — I959 Hypotension, unspecified: Secondary | ICD-10-CM | POA: Diagnosis not present

## 2023-06-15 DIAGNOSIS — R000 Tachycardia, unspecified: Secondary | ICD-10-CM

## 2023-06-15 DIAGNOSIS — I9589 Other hypotension: Secondary | ICD-10-CM

## 2023-06-15 DIAGNOSIS — I480 Paroxysmal atrial fibrillation: Secondary | ICD-10-CM | POA: Diagnosis not present

## 2023-06-15 DIAGNOSIS — D62 Acute posthemorrhagic anemia: Secondary | ICD-10-CM

## 2023-06-15 DIAGNOSIS — I4729 Other ventricular tachycardia: Secondary | ICD-10-CM | POA: Diagnosis not present

## 2023-06-15 DIAGNOSIS — I5033 Acute on chronic diastolic (congestive) heart failure: Secondary | ICD-10-CM | POA: Diagnosis not present

## 2023-06-15 LAB — CBC
HCT: 22.7 % — ABNORMAL LOW (ref 39.0–52.0)
Hemoglobin: 7.9 g/dL — ABNORMAL LOW (ref 13.0–17.0)
MCH: 40.1 pg — ABNORMAL HIGH (ref 26.0–34.0)
MCHC: 34.8 g/dL (ref 30.0–36.0)
MCV: 115.2 fL — ABNORMAL HIGH (ref 80.0–100.0)
Platelets: 148 10*3/uL — ABNORMAL LOW (ref 150–400)
RBC: 1.97 MIL/uL — ABNORMAL LOW (ref 4.22–5.81)
RDW: 14.4 % (ref 11.5–15.5)
WBC: 9.8 10*3/uL (ref 4.0–10.5)
nRBC: 0.6 % — ABNORMAL HIGH (ref 0.0–0.2)

## 2023-06-15 LAB — BASIC METABOLIC PANEL WITH GFR
Anion gap: 5 (ref 5–15)
BUN: 33 mg/dL — ABNORMAL HIGH (ref 8–23)
CO2: 21 mmol/L — ABNORMAL LOW (ref 22–32)
Calcium: 8.3 mg/dL — ABNORMAL LOW (ref 8.9–10.3)
Chloride: 108 mmol/L (ref 98–111)
Creatinine, Ser: 1.18 mg/dL (ref 0.61–1.24)
GFR, Estimated: >60 mL/min (ref >60)
Glucose, Bld: 150 mg/dL — ABNORMAL HIGH (ref 70–99)
Potassium: 3.9 mmol/L (ref 3.5–5.1)
Sodium: 134 mmol/L — ABNORMAL LOW (ref 135–145)

## 2023-06-15 LAB — ECHOCARDIOGRAM COMPLETE
AR max vel: 2.53 cm2
AV Area VTI: 2.35 cm2
AV Area mean vel: 2.29 cm2
AV Mean grad: 7.2 mmHg
AV Peak grad: 15.6 mmHg
Ao pk vel: 1.97 m/s
Area-P 1/2: 2.62 cm2
Calc EF: 75.4 %
Height: 65.5 in
MV VTI: 2.46 cm2
S' Lateral: 3.6 cm
Single Plane A2C EF: 77.5 %
Single Plane A4C EF: 74.7 %
Weight: 4042.35 [oz_av]

## 2023-06-15 LAB — HEMOGLOBIN AND HEMATOCRIT, BLOOD
HCT: 26.8 % — ABNORMAL LOW (ref 39.0–52.0)
Hemoglobin: 8.7 g/dL — ABNORMAL LOW (ref 13.0–17.0)

## 2023-06-15 LAB — GLUCOSE, CAPILLARY
Glucose-Capillary: 204 mg/dL — ABNORMAL HIGH (ref 70–99)
Glucose-Capillary: 128 mg/dL — ABNORMAL HIGH (ref 70–99)
Glucose-Capillary: 164 mg/dL — ABNORMAL HIGH (ref 70–99)
Glucose-Capillary: 156 mg/dL — ABNORMAL HIGH (ref 70–99)

## 2023-06-15 LAB — PREPARE RBC (CROSSMATCH)

## 2023-06-15 LAB — MAGNESIUM: Magnesium: 2.2 mg/dL (ref 1.7–2.4)

## 2023-06-15 MED ORDER — SODIUM CHLORIDE 0.9% IV SOLUTION: Freq: Once | INTRAVENOUS | Status: DC

## 2023-06-15 MED ORDER — FUROSEMIDE 10 MG/ML IJ SOLN
40.0000 mg | Freq: Once | INTRAMUSCULAR | Status: AC
  Administered 2023-06-15: 40 mg via INTRAVENOUS
  Filled 2023-06-15: qty 4

## 2023-06-15 MED ORDER — METOPROLOL TARTRATE 25 MG PO TABS
25.0000 mg | ORAL_TABLET | Freq: Two times a day (BID) | ORAL | Status: DC
  Administered 2023-06-15 – 2023-06-17 (×5): 25 mg via ORAL
  Filled 2023-06-15 (×6): qty 1

## 2023-06-15 MED ORDER — SODIUM CHLORIDE 0.9% IV SOLUTION: Freq: Once | INTRAVENOUS | Status: AC

## 2023-06-15 MED ORDER — BUDESONIDE-FORMOTEROL FUMARATE 160-4.5 MCG/ACT IN AERO: 2.0000 | INHALATION_SPRAY | Freq: Two times a day (BID) | RESPIRATORY_TRACT | Status: DC | PRN

## 2023-06-15 MED ORDER — AMIODARONE HCL 200 MG PO TABS: 200.0000 mg | ORAL_TABLET | Freq: Two times a day (BID) | ORAL | Status: DC

## 2023-06-15 MED ORDER — METOPROLOL TARTRATE 25 MG PO TABS: 25.0000 mg | ORAL_TABLET | Freq: Two times a day (BID) | ORAL | Status: DC

## 2023-06-15 MED ORDER — TIOTROPIUM BROMIDE MONOHYDRATE 18 MCG IN CAPS: 18.0000 ug | ORAL_CAPSULE | Freq: Every day | RESPIRATORY_TRACT | Status: DC | PRN

## 2023-06-15 MED ORDER — BISACODYL 10 MG RE SUPP
10.0000 mg | Freq: Once | RECTAL | Status: AC
  Administered 2023-06-15: 10 mg via RECTAL
  Filled 2023-06-15: qty 1

## 2023-06-15 MED ORDER — SENNOSIDES 8.8 MG/5ML PO SYRP
10.0000 mL | ORAL_SOLUTION | Freq: Two times a day (BID) | ORAL | Status: DC
  Administered 2023-06-15 – 2023-06-17 (×4): 10 mL via ORAL
  Filled 2023-06-15 (×9): qty 10

## 2023-06-15 MED ORDER — METOPROLOL TARTRATE 12.5 MG HALF TABLET
12.5000 mg | ORAL_TABLET | Freq: Two times a day (BID) | ORAL | Status: DC
  Administered 2023-06-15: 12.5 mg via ORAL
  Filled 2023-06-15: qty 1

## 2023-06-15 NOTE — Hospital Course (Signed)
 Carlos Dawson is a 83 y.o. male with medical history significant for CAD, diastolic CHF, COPD on room air, history of DVT, paroxysmal atrial fibrillation status post Watchman procedure, no longer on anticoagulation who was admitted to the hospital by the orthopedic service on 5/19 after successful left hip total arthroplasty who is now being seen in consultation due to concern for hypotension, tachycardia and dyspnea with exertion.  Aaron Aas

## 2023-06-15 NOTE — Progress Notes (Signed)
 PROGRESS NOTE    Patient: Carlos Dawson                            PCP: Tena Feeling, MD                    DOB: 04/20/1940            DOA: 06/11/2023 WUX:[id number]             DOS: 06/15/2023, 12:49 PM   LOS: 1 day   Date of Service: The patient was seen and examined on 06/15/2023  Subjective:   The patient was seen and examined this morning.  Alert oriented, heart rate 91-1 09, blood pressure as low as 89/50 currently this morning 117/48, satting 95% on 2 L of oxygen   - On amiodarone drip this morning, fluid normal saline   Brief Narrative:   Carlos Dawson is a 83 y.o. male with medical history significant for CAD, diastolic CHF, COPD on room air, history of DVT, paroxysmal atrial fibrillation status post Watchman procedure, no longer on anticoagulation who was admitted to the hospital by the orthopedic service on 5/19 after successful left hip total arthroplasty who is now being seen in consultation due to concern for hypotension, tachycardia and dyspnea with exertion.  .     Assessment & Plan:   Principal Problem:   NSVT (nonsustained ventricular tachycardia) (HCC) Active Problems:   OA (osteoarthritis) of hip   Hypotension   Status post hip replacement   Primary osteoarthritis of left hip   Paroxysmal atrial fibrillation (HCC)   SOB (shortness of breath)    Tachycardia, with history of A-fib - Post hip arthroplasty patient was noted to be hypotensive, tachycardic thought to be in A-fib with RVR -History of paroxysmal A-fib s/p ablation May 11, 2017, Watchman device May 2024 - Started on amiodarone drip overnight, IV fluids - Cardiology was consulted - Amiodarone drip/p.o. amiodarone to be discontinued per cardiology recommendation - Continue metoprolol  for rate control -Will follow-up with cardiology recommendation, Echocardiogram - Will continue to monitor closely and stepdown setting  History of paroxysmal atrial fibrillation, -  s/p ablation April  2019, Watchman device 2024 - Discontinue IV amiodarone, heparin  - Cardiology continue Lopressor  25 mg p.o. twice daily for rate control - Appreciate cardiology following closely  Hypotensive -postop -Holding antihypertensive medications, stopping Jardiance  due to hypotension -Will consider p.o. midodrine or IV pressors if needed -Currently blood pressure stabilizing but still soft - Discontinuing IV fluids - Once BP stabilizes likely will need diuretics - Pursuing with 1U PRBC blood transfusion  Total hip arthroplasty Postop day # 4 - Stable - Orthopedic team following, - Pain management per Ortho  History of CAD, - With history of cardiac cath, stenting in 2012, - Continue home medication of aspirin , intolerant to statins  HFpEF - ?  Pulmonary hypertension - Last echo 11/12/22 ejection fraction 60-65%, 1 diastolic dysfunction - Cardiology mildly volume overloaded, will need to be diuresed, with blood pressures remain soft - Starting IV Lasix  40 mg post PRBC transfusion - Will monitor I's and O's  Acute on chronic anemia -exacerbated postop and with IVF - Cardiology recommendation hemoglobin needs to be >8 Which also may have been contributed to hypotension, and tachycardia    Latest Ref Rng & Units 06/15/2023    9:58 AM 06/14/2023    6:54 AM 06/13/2023    3:16 AM  CBC  WBC 4.0 -  10.5 K/uL 9.8  6.3  6.7   Hemoglobin 13.0 - 17.0 g/dL 7.9  8.2  8.1   Hematocrit 39.0 - 52.0 % 22.7  25.7  24.8   Platelets 150 - 400 K/uL 148  127  125   - Cussed with patient, agreed with transfusion - Pursuing with 1 unit PRBC blood transfusion  History of thoracic aortic aneurysm - 4.9 cm on last CT, continue follows outpatient  History of PE - Currently on Xarelto        ----------------------------------------------------------------------------------------------------------------------------------------------- Nutritional status:  The patient's BMI is: Body mass index is 41.4  kg/m. I agree with the assessment and plan as outlined below: Nutrition Status:           Skin Assessment: I have examined the patient's skin and I agree with the wound assessment as performed by wound care team As outlined belowe: Pressure Injury 06/14/23 Buttocks Mid Deep Tissue Pressure Injury - Purple or maroon localized area of discolored intact skin or blood-filled blister due to damage of underlying soft tissue from pressure and/or shear. Area of non-blanching purple skin on (Active)  06/14/23 1830  Location: Buttocks  Location Orientation: Mid  Staging: Deep Tissue Pressure Injury - Purple or maroon localized area of discolored intact skin or blood-filled blister due to damage of underlying soft tissue from pressure and/or shear.  Wound Description (Comments): Area of non-blanching purple skin on buttocks  Present on Admission: Yes  Dressing Type Foam - Lift dressing to assess site every shift 06/15/23 0800    ---------------------------------------------------------------------------------------------------------------------  DVT prophylaxis:  rivaroxaban  (XARELTO ) tablet 10 mg Start: 06/12/23 0800 Place TED hose Start: 06/11/23 1651 SCDs Start: 06/11/23 1433 rivaroxaban  (XARELTO ) tablet 10 mg   Code Status:   Code Status: Full Code  Family Communication: No family member present at bedside- attempt will be made to update daily The above findings and plan of care has been discussed with patient (and family)  in detail,  they expressed understanding and agreement of above. -Advance care planning has been discussed.   Admission status:   Status is: Inpatient Remains inpatient appropriate because: Needing close monitoring in critical care, with A-fib, anemia, needing diuretics, blood transfusion   Disposition: From  - home             Planning for discharge in 2 -3  days: to Home    Procedures:   PR ARTHRP ACETBLR/PROX FEM PROSTC AGRFT/ALGRFT  [27130]   Antimicrobials:  Anti-infectives (From admission, onward)    Start     Dose/Rate Route Frequency Ordered Stop   06/11/23 1830  ceFAZolin  (ANCEF ) IVPB 2g/100 mL premix        2 g 200 mL/hr over 30 Minutes Intravenous Every 6 hours 06/11/23 1651 06/12/23 0747   06/11/23 1030  ceFAZolin  (ANCEF ) IVPB 2g/100 mL premix        2 g 200 mL/hr over 30 Minutes Intravenous On call to O.R. 06/11/23 1025 06/11/23 1300        Medication:   sodium chloride    Intravenous Once   Chlorhexidine  Gluconate Cloth  6 each Topical Daily   docusate sodium   100 mg Oral BID   furosemide   40 mg Intravenous Once   insulin  aspart  0-15 Units Subcutaneous TID WC   loratadine   10 mg Oral Daily   metoprolol  tartrate  12.5 mg Oral BID   pantoprazole   40 mg Oral Daily   potassium chloride   20 mEq Oral QPM   potassium chloride  SA  40 mEq  Oral Daily   rivaroxaban   10 mg Oral Q breakfast   tamsulosin   0.4 mg Oral QPM   topiramate   50 mg Oral BID   tranexamic acid  (CYKLOKAPRON ) 2,000 mg in sodium chloride  0.9 % 50 mL Topical Application  2,000 mg Topical Once    acetaminophen , albuterol , bisacodyl , budesonide -formoterol , cyclobenzaprine , HYDROcodone -acetaminophen , HYDROcodone -acetaminophen , HYDROmorphone  (DILAUDID ) injection, magnesium  citrate, menthol -cetylpyridinium **OR** phenol, metoCLOPramide  **OR** metoCLOPramide  (REGLAN ) injection, nitroGLYCERIN , ondansetron  **OR** ondansetron  (ZOFRAN ) IV, mouth rinse, polyethylene glycol, simethicone , tiotropium   Objective:   Vitals:   06/15/23 1000 06/15/23 1100 06/15/23 1200 06/15/23 1205  BP: 96/77 (!) 89/64 (!) 117/48   Pulse: (!) 103 (!) 109 95   Resp: (!) 22 19 19    Temp:    99.1 F (37.3 C)  TempSrc:    Oral  SpO2: 94% 95% 95%   Weight:      Height:        Intake/Output Summary (Last 24 hours) at 06/15/2023 1249 Last data filed at 06/15/2023 1191 Gross per 24 hour  Intake 231.49 ml  Output --  Net 231.49 ml   Filed Weights   06/11/23 1106  06/14/23 1829  Weight: 106.1 kg 114.6 kg     Physical examination:   Constitution:  Alert, cooperative, no distress,  Appears calm and comfortable  Psychiatric:   Normal and stable mood and affect, cognition intact,   HEENT:        Normocephalic, PERRL, otherwise with in Normal limits  Chest:         Chest symmetric Cardio vascular:  S1/S2, RRR, No murmure, No Rubs or Gallops  pulmonary: Clear to auscultation bilaterally, respirations unlabored, negative wheezes / crackles Abdomen: Soft, non-tender, non-distended, bowel sounds,no masses, no organomegaly Muscular skeletal: Limited exam - in bed, able to move all 4 extremities,   Neuro: CNII-XII intact. , normal motor and sensation, reflexes intact  Extremities: No pitting edema lower extremities, +2 pulses  Skin: Dry, warm to touch, negative for any Rashes, No open wounds Wounds: per nursing documentation   ------------------------------------------------------------------------------------------------------------------------------------------    LABs:     Latest Ref Rng & Units 06/15/2023    9:58 AM 06/14/2023    6:54 AM 06/13/2023    3:16 AM  CBC  WBC 4.0 - 10.5 K/uL 9.8  6.3  6.7   Hemoglobin 13.0 - 17.0 g/dL 7.9  8.2  8.1   Hematocrit 39.0 - 52.0 % 22.7  25.7  24.8   Platelets 150 - 400 K/uL 148  127  125       Latest Ref Rng & Units 06/15/2023    9:58 AM 06/14/2023    5:40 PM 06/12/2023    3:17 AM  CMP  Glucose 70 - 99 mg/dL 478  295  621   BUN 8 - 23 mg/dL 33  31  30   Creatinine 0.61 - 1.24 mg/dL 3.08  6.57  8.46   Sodium 135 - 145 mmol/L 134  133  135   Potassium 3.5 - 5.1 mmol/L 3.9  4.2  4.5   Chloride 98 - 111 mmol/L 108  107  105   CO2 22 - 32 mmol/L 21  20  22    Calcium  8.9 - 10.3 mg/dL 8.3  8.5  7.9        Micro Results Recent Results (from the past 240 hours)  MRSA Next Gen by PCR, Nasal     Status: None   Collection Time: 06/14/23  6:32 PM   Specimen: Nasal Mucosa; Nasal Swab  Result Value Ref  Range Status   MRSA by PCR Next Gen NOT DETECTED NOT DETECTED Final    Comment: (NOTE) The GeneXpert MRSA Assay (FDA approved for NASAL specimens only), is one component of a comprehensive MRSA colonization surveillance program. It is not intended to diagnose MRSA infection nor to guide or monitor treatment for MRSA infections. Test performance is not FDA approved in patients less than 20 years old. Performed at [facility], [address] [place], [place] [address]     Radiology Reports ECHOCARDIOGRAM COMPLETE Result Date: 06/15/2023    ECHOCARDIOGRAM REPORT   Patient Name:   Carlos Dawson Date of Exam: 06/15/2023 Medical Rec #:  [id number]        Height:       65.5 in Accession #:    [id number]       Weight:       252.6 lb Date of Birth:  08-16-1940        BSA:          2.197 m Patient Age:    82 years         BP:           114/61 mmHg Patient Gender: M                HR:           91 bpm. Exam Location:  Inpatient Procedure: 2D Echo, Cardiac Doppler and Color Doppler (Both Spectral and Color            Flow Doppler were utilized during procedure). STAT ECHO Indications:    Pericardial effusion  History:        Patient has prior history of Echocardiogram examinations, most                 recent 10/26/2022. CHF, CAD, Arrythmias:Atrial Fibrillation,                 Signs/Symptoms:Chest Pain; Risk Factors:Dyslipidemia, Sleep                 Apnea and Diabetes. CKD stage 3.  Sonographer:    Juanita Shaw Referring Phys: [id number] SHENG L HALEY IMPRESSIONS  1. Technically difficult; ascending aorta appears to be moderately dilated; suggest CTA or MRA to further assess; no pericardial effusion noted.  2. Left ventricular ejection fraction, by estimation, is 70 to 75%. The left ventricle has hyperdynamic function. The left ventricle has no regional wall motion abnormalities. Left ventricular diastolic parameters are indeterminate.  3. Right ventricular systolic function is normal. The  right ventricular size is normal.  4. The mitral valve is normal in structure. No evidence of mitral valve regurgitation. No evidence of mitral stenosis.  5. The aortic valve was not well visualized. Aortic valve regurgitation is not visualized. No aortic stenosis is present.  6. There is mild dilatation of the aortic root, measuring 40 mm. There is moderate dilatation of the ascending aorta, measuring 46 mm. FINDINGS  Left Ventricle: Left ventricular ejection fraction, by estimation, is 70 to 75%. The left ventricle has hyperdynamic function. The left ventricle has no regional wall motion abnormalities. The left ventricular internal cavity size was normal in size. There is no left ventricular hypertrophy. Left ventricular diastolic function could not be evaluated due to atrial fibrillation. Left ventricular diastolic parameters are indeterminate. Right Ventricle: The right ventricular size is normal. Right ventricular systolic function is normal. Left Atrium: Left atrial size was normal in size. Right Atrium: Right  atrial size was normal in size. Pericardium: There is no evidence of pericardial effusion. Mitral Valve: The mitral valve is normal in structure. No evidence of mitral valve regurgitation. No evidence of mitral valve stenosis. MV peak gradient, 2.5 mmHg. The mean mitral valve gradient is 2.0 mmHg. Tricuspid Valve: The tricuspid valve is normal in structure. Tricuspid valve regurgitation is trivial. No evidence of tricuspid stenosis. Aortic Valve: The aortic valve was not well visualized. Aortic valve regurgitation is not visualized. No aortic stenosis is present. Aortic valve mean gradient measures 7.2 mmHg. Aortic valve peak gradient measures 15.6 mmHg. Aortic valve area, by VTI measures 2.35 cm. Pulmonic Valve: The pulmonic valve was not well visualized. Pulmonic valve regurgitation is trivial. No evidence of pulmonic stenosis. Aorta: The aortic root is normal in size and structure. There is mild  dilatation of the aortic root, measuring 40 mm. There is moderate dilatation of the ascending aorta, measuring 46 mm. Venous: The inferior vena cava was not well visualized. IAS/Shunts: The interatrial septum was not well visualized. Additional Comments: Technically difficult; ascending aorta appears to be moderately dilated; suggest CTA or MRA to further assess; no pericardial effusion noted.  LEFT VENTRICLE PLAX 2D LVIDd:         5.30 cm      Diastology LVIDs:         3.60 cm      LV e' medial:    5.77 cm/s LV PW:         1.00 cm      LV E/e' medial:  14.8 LV IVS:        1.00 cm      LV e' lateral:   5.87 cm/s LVOT diam:     2.00 cm      LV E/e' lateral: 14.5 LV SV:         64 LV SV Index:   29 LVOT Area:     3.14 cm  LV Volumes (MOD) LV vol d, MOD A2C: 153.0 ml LV vol d, MOD A4C: 140.0 ml LV vol s, MOD A2C: 34.4 ml LV vol s, MOD A4C: 35.4 ml LV SV MOD A2C:     118.6 ml LV SV MOD A4C:     140.0 ml LV SV MOD BP:      110.3 ml RIGHT VENTRICLE RV Basal diam:  4.30 cm RV Mid diam:    3.20 cm RV S prime:     17.70 cm/s TAPSE (M-mode): 2.4 cm LEFT ATRIUM             Index        RIGHT ATRIUM           Index LA diam:        3.20 cm 1.46 cm/m   RA Area:     16.20 cm LA Vol (A2C):   45.3 ml 20.62 ml/m  RA Volume:   34.90 ml  15.88 ml/m LA Vol (A4C):   45.2 ml 20.57 ml/m LA Biplane Vol: 45.7 ml 20.80 ml/m  AORTIC VALVE                     PULMONIC VALVE AV Area (Vmax):    2.53 cm      PV Vmax:          1.74 m/s AV Area (Vmean):   2.29 cm      PV Peak grad:     12.2 mmHg AV Area (VTI):     2.35 cm  PR End Diast Vel: 5.02 msec AV Vmax:           197.20 cm/s AV Vmean:          115.020 cm/s AV VTI:            0.272 m AV Peak Grad:      15.6 mmHg AV Mean Grad:      7.2 mmHg LVOT Vmax:         159.00 cm/s LVOT Vmean:        83.800 cm/s LVOT VTI:          0.203 m LVOT/AV VTI ratio: 0.75  AORTA Ao Root diam: 4.00 cm Ao Asc diam:  4.57 cm MITRAL VALVE               TRICUSPID VALVE MV Area (PHT): 2.62 cm    TR Peak  grad:   35.5 mmHg MV Area VTI:   2.46 cm    TR Vmax:        298.00 cm/s MV Peak grad:  2.5 mmHg MV Mean grad:  2.0 mmHg    SHUNTS MV Vmax:       0.80 m/s    Systemic VTI:  0.20 m MV Vmean:      65.8 cm/s   Systemic Diam: 2.00 cm MV Decel Time: 290 msec MV E velocity: 85.40 cm/s MV A velocity: 74.90 cm/s MV E/A ratio:  1.14 Alexandria Angel MD Electronically signed by Alexandria Angel MD Signature Date/Time: 06/15/2023/10:12:25 AM    Final    DG CHEST PORT 1 VIEW Result Date: 06/14/2023 CLINICAL DATA:  Shortness of breath. EXAM: PORTABLE CHEST 1 VIEW COMPARISON:  Radiograph 06/15/2022 FINDINGS: Cardiomegaly is stable. Unchanged mediastinal contours. Streaky atelectasis in both lung bases. No confluent airspace disease. No pulmonary edema, large pleural effusion or pneumothorax. Thoracolumbar spinal fusion hardware is partially included in the field of view. IMPRESSION: 1. Streaky atelectasis in both lung bases. 2. Stable cardiomegaly. Electronically Signed   By: Chadwick Colonel M.D.   On: 06/14/2023 18:16    SIGNED: Bobbetta Burnet, MD, FHM. FAAFP. Arlin Benes - [facility] Critical care time spent - 55 min.  In seeing, evaluating and examining the patient. Reviewing medical records, labs, drawn plan of care. [facility],  Pager (please use [email or web address] to page/ text) Please use Epic Secure Chat for non-urgent communication (7AM-7PM)  If 7PM-7AM, please contact night-coverage [email or web address], 06/15/2023, 12:49 PM

## 2023-06-15 NOTE — Progress Notes (Signed)
  Echocardiogram 2D Echocardiogram has been performed.  Doyle Kunath L Sanora Cunanan RDCS 06/15/2023, 10:05 AM

## 2023-06-15 NOTE — Progress Notes (Signed)
 Subjective: 4 Days Post-Op Procedure(s) (LRB): ARTHROPLASTY, HIP, TOTAL, ANTERIOR APPROACH (Left) Patient reports pain as mild.   Patient seen in rounds for Dr. France Ina. Patient reports hip pain controlled. Complaints of constipation and gas pains  Objective: Vital signs in last 24 hours: Temp:  [97.8 F (36.6 C)-99.1 F (37.3 C)] 97.8 F (36.6 C) (05/23 0430) Pulse Rate:  [78-141] 91 (05/23 0700) Resp:  [11-23] 14 (05/23 0700) BP: (84-144)/(49-108) 91/58 (05/23 0700) SpO2:  [88 %-100 %] 92 % (05/23 0807) FiO2 (%):  [28 %] 28 % (05/23 0807) Weight:  [114.6 kg] 114.6 kg (05/22 1829)  Intake/Output from previous day:  Intake/Output Summary (Last 24 hours) at 06/15/2023 0824 Last data filed at 06/15/2023 0529 Gross per 24 hour  Intake 403.87 ml  Output --  Net 403.87 ml    Intake/Output this shift: No intake/output data recorded.  Labs: Recent Labs    06/13/23 0316 06/14/23 0654  HGB 8.1* 8.2*   Recent Labs    06/13/23 0316 06/14/23 0654  WBC 6.7 6.3  RBC 2.23* 2.22*  HCT 24.8* 25.7*  PLT 125* 127*   Recent Labs    06/14/23 1740  NA 133*  K 4.2  CL 107  CO2 20*  BUN 31*  CREATININE 0.82  GLUCOSE 143*  CALCIUM  8.5*   No results for input(s): "LABPT", "INR" in the last 72 hours.  Exam: General - Patient is Alert and Oriented Extremity - Neurologically intact Neurovascular intact Sensation intact distally Dorsiflexion/Plantar flexion intact Dressing/Incision - clean, dry, no drainage Motor Function - intact, moving foot and toes well on exam.   Past Medical History:  Diagnosis Date   Anticoagulant long-term use    Aortic root enlargement (HCC)    Ascending aortic aneurysm (HCC)    recent scan in October 2012 showing no change; followed by Dr. Nicanor Barge   ASCVD (arteriosclerotic cardiovascular disease)    Prior BMS to the 2nd OM in September 2012; with repeat cath in October showing patency   CAD (coronary artery disease)    a. s/p BMS to 2nd  OM in Sept 2012; b. LexiScan  Myoview  (12/2012):  Inf infarct; bowel and motion artifact make study difficult to interpret; no ischemia; not gated; Low Risk   CHF (congestive heart failure) (HCC)    no recent issues 10/13/14   Chronic back pain    "all over my back" (05/11/2017)   Colonic polyp    Contact lens/glasses fitting    COPD (chronic obstructive pulmonary disease) (HCC)    Diastolic dysfunction    DVT (deep venous thrombosis) (HCC)    ?LLE   GERD (gastroesophageal reflux disease)    Hearing loss    Hearing loss    more so on left   Hemorrhoids    History of stomach ulcers    Hypertension    IBS (irritable bowel syndrome)    LVH (left ventricular hypertrophy)    OA (osteoarthritis)    "all over" (05/11/2017)   Obesities, morbid (HCC)    OSA (obstructive sleep apnea)    PSG 03/30/97 AHI 21, BPAP 13/9   OSA on CPAP    PAF (paroxysmal atrial fibrillation) (HCC)    a. on Xarelto  b. s/p DCCV in 08/2016; b. Tikosyn  failed 04/16/17 with plans for Multaq  and possible Afib ablation with Dr. Nunzio Belch   Pneumonia    'several times" (05/11/2017)   Presence of Watchman left atrial appendage closure device 06/15/2022   24mm Watchman FLX Pro placed by Dr. Marven Slimmer  Prostate [facility])    Oncologist  DR. Cristal [facility] dx 09/24/14, undetermined tx   prostate; S/P "radiation and hormone injections"   Pulmonary embolism (HCC) 2008   "both lungs"   SOB (shortness of breath)    on excertion   Thoracic aortic aneurysm (HCC)    Aortic Size Index=     5.0    /Body surface area is 2.43 meters squared. = 2.05  < 2.75 cm/m2      4% risk per year 2.75 to 4.25          8% risk per year > 4.25 cm/m2    20% risk per year   Stable aneurysmal dilation of the ascending aorta with maximum AP diameter of 4.8 cm. Stable area of narrowing of the proximal most portion of the descending aorta measuring 2 cm., previously identified as an area of coarctation. No evidence of aortic dissection.  Coronary artery disease.   Normal appearance of the lungs.   Electronically Signed   By: Dobrinka  Dimitrova M.D.   On: 10/01/2014 08:50     Type II diabetes mellitus (HCC)    metphormin, average 154 dx 2017    Assessment/Plan: 4 Days Post-Op Procedure(s) (LRB): ARTHROPLASTY, HIP, TOTAL, ANTERIOR APPROACH (Left) Principal Problem:   OA (osteoarthritis) of hip Active Problems:   Primary osteoarthritis of left hip   Status post hip replacement  Estimated body mass index is 41.4 kg/m as calculated from the following:   Height as of this encounter: 5' 5.5" (1.664 m).   Weight as of this encounter: 114.6 kg.  DVT Prophylaxis - Xarelto  Weight-bearing as tolerated  Suppository ordered, has simethicone  as well. Resume PT when able. Very appreciative of hospitalists' assistance  Sharlynn Dear, [facility] [phone] 06/15/2023, 8:24 AM

## 2023-06-15 NOTE — Progress Notes (Signed)
 Physical Therapy Treatment Patient Details Name: Carlos Dawson MRN: [id number] DOB: 1940-04-15 Today's Date: 06/15/2023   History of Present Illness 83 yo male presents to therapy s/p L THA, anterior approach on 06/11/2023 due to failure of conservative measures. Pt PMH includes but is not limited to: COPD, A-fib, lung nodule, diverticulitis, asthma, R wrist CTS, anxiety, prostate ca, anemia, R THA (2020), IBS, HOH, GERD, DM II, s/p lumbar fusion, cervical spine surgery, PE, OA, OSA, CHF, thoracic aortic aneurysm, and watchman closure device L atrial appendage.Transfer to SDU 5/22/due to afib.    PT Comments  Patient  essentially on Bedrest today due to afib/pressors.  Assisted patient  in AAROM exercises for BLE's.  Patient is very drowsy but able to participate,  Continue PT  for mobility as medical status allows.    If plan is discharge home, recommend the following: Assistance with cooking/housework;Help with stairs or ramp for entrance;Assist for transportation;A lot of help with walking and/or transfers;A lot of help with bathing/dressing/bathroom   Can travel by private vehicle        Equipment Recommendations  None recommended by PT    Recommendations for Other Services       Precautions / Restrictions Precautions Precautions: Fall Precaution/Restrictions Comments: Reports R (non-surgical) hip buckles and spasms Restrictions LLE Weight Bearing Per Provider Order: Weight bearing as tolerated     Mobility  Bed Mobility               General bed mobility comments: deferred.    Transfers                        Ambulation/Gait                   Stairs             Wheelchair Mobility     Tilt Bed    Modified Rankin (Stroke Patients Only)       Balance                                            Communication Communication Communication: No apparent difficulties  Cognition Arousal: Lethargic Behavior  During Therapy: WFL for tasks assessed/performed                           PT - Cognition Comments: very sleepy, minimal  conversation, dozing but able to participate in ROM exercises Following commands: Intact      Cueing Cueing Techniques: Verbal cues, Tactile cues  Exercises Total Joint Exercises Ankle Circles/Pumps: AROM, Both, 15 reps, Supine Short Arc Quad: AAROM, Both, 10 reps, Supine Heel Slides: AAROM, Both, 10 reps, Supine Heel Slides Limitations: gait belt to assist Hip ABduction/ADduction: AAROM, Both, 10 reps, Limitations Hip Abduction/Adduction Limitations: gait belt to assist    General Comments        Pertinent Vitals/Pain Pain Assessment Pain Assessment: Faces Faces Pain Scale: Hurts even more Pain Location: L and right hip with ROM Pain Descriptors / Indicators: Discomfort, Grimacing    Home Living                          Prior Function            PT Goals (current goals can now be found in the  care plan section) Progress towards PT goals: Not progressing toward goals - comment (medical complication)    Frequency    7X/week      PT Plan      Co-evaluation              AM-PAC PT "6 Clicks" Mobility   Outcome Measure  Help needed turning from your back to your side while in a flat bed without using bedrails?: A Lot Help needed moving from lying on your back to sitting on the side of a flat bed without using bedrails?: A Lot Help needed moving to and from a bed to a chair (including a wheelchair)?: Total Help needed standing up from a chair using your arms (e.g., wheelchair or bedside chair)?: Total Help needed to walk in hospital room?: Total Help needed climbing 3-5 steps with a railing? : Total 6 Click Score: 8    End of Session   Activity Tolerance: Patient limited by lethargy;Treatment limited secondary to medical complications (Comment) Patient left: in bed;with call bell/phone within reach;with  family/visitor present Nurse Communication: Mobility status PT Visit Diagnosis: Unsteadiness on feet (R26.81);Other abnormalities of gait and mobility (R26.89);Muscle weakness (generalized) (M62.81);History of falling (Z91.81);Difficulty in walking, not elsewhere classified (R26.2);Pain Pain - Right/Left: Left Pain - part of body: Hip;Leg     Time: 1415-1435 PT Time Calculation (min) (ACUTE ONLY): 20 min  Charges:    $Therapeutic Exercise: 8-22 mins PT General Charges $$ ACUTE PT VISIT: 1 Visit                     Abelina Hoes PT Acute Rehabilitation Services Office [phone]    Dareen Ebbing 06/15/2023, 3:07 PM

## 2023-06-15 NOTE — Consult Note (Addendum)
 Cardiology Consultation   Patient ID: Carlos Dawson MRN: [id number]; DOB: [birth year]  Admit date: 06/11/2023 Date of Consult: 06/15/2023  PCP:  Tena Feeling, MD   [facility] Providers Cardiologist:  Peter [name], MD  Electrophysiologist:  Boyce Byes, MD  {  Patient Profile:   Carlos Dawson is a 83 y.o. male with a hx of paroxysmal atrial fibrillation DCCV August 2018, ablation April 2019, watchman's (due to hematuria) May 2024, CAD status post BMS to second OM in 2012, chronic HFpEF, pulmonary hypertension by heart cath 2016, prior PE, OSA on CPAP hypertension, hyperlipidemia, type 2 diabetes, thoracic aortic aneurysm, prior smoker, pulmonary nodule, prostate cancer status postradiation who is being seen 06/15/2023 for the evaluation of atrial fibrillation, tachycardia, hypotension at the request of Dr. France Ina.  History of Present Illness:   Carlos Dawson was diagnosed with atrial fibrillation in 2018 with history/interventions as above no longer on any antiarrhythmic and no anticoagulation after watchman's/ablation.  He called the office in August 2024 with reports of atrial fibrillation on Apple Watch, monitor ordered showing NSR with frequent PACs, runs of SVT and 1 pause 3.6 seconds.  In regards to his CAD, he has had BMS placed in 2012, last cardiac catheterization was in 2019 showing patent stents.  Due to complaints of chest pain, a stress PET was ordered October 2024, although read high risk, reviewed by Dr. [name] and deemed low risk.  He was last seen February 2025, noted to be hypotensive with systolics 88 at office visit.  His losartan  and metoprolol  were stopped.  Currently patient admitted for left hip total arthroplasty, now postop day 4.  Postoperatively has had significant hypotension, tachycardia, dyspnea.  Losartan  spironolactone  were continued in the hospital but have since been stopped.  Has had some postoperative anemia, mild dehydration and now on  Xarelto  for prophylaxis.  Hospitalist team was consulted, felt he was in atrial fibrillation so he was started on amiodarone drip and heparin .  Given fluid boluses but still hypotensive.  Cardiology consulted for further assistance.  Currently patient reporting shortness of breath, orthopnea, peripheral edema.  All of the symptoms started postoperatively.  He reports some alerts on his Apple Watch for atrial fibrillation, asymptomatic with no palpitations, dizziness though.  Normally does not swell at home.  Blood pressures have been hypotensive at home.  He is having no chest pain.  Heart rates have fluctuated anywhere between low 100s to reportedly as high as 160.  Blood pressure remains low 80s to 90s, has some extraneous values.    Chest x-ray with cardiomegaly.  Potassium 4.2.  Creatinine 0.82 magnesium  2.1.  Hemoglobin 8.2.  Past Medical History:  Diagnosis Date   Anticoagulant long-term use    Aortic root enlargement (HCC)    Ascending aortic aneurysm (HCC)    recent scan in October 2012 showing no change; followed by Dr. Nicanor Barge   ASCVD (arteriosclerotic cardiovascular disease)    Prior BMS to the 2nd OM in September 2012; with repeat cath in October showing patency   CAD (coronary artery disease)    a. s/p BMS to 2nd OM in Sept 2012; b. LexiScan  Myoview  (12/2012):  Inf infarct; bowel and motion artifact make study difficult to interpret; no ischemia; not gated; Low Risk   CHF (congestive heart failure) (HCC)    no recent issues 10/13/14   Chronic back pain    "all over my back" (05/11/2017)   Colonic polyp    Contact lens/glasses fitting  COPD (chronic obstructive pulmonary disease) (HCC)    Diastolic dysfunction    DVT (deep venous thrombosis) (HCC)    ?LLE   GERD (gastroesophageal reflux disease)    Hearing loss    Hearing loss    more so on left   Hemorrhoids    History of stomach ulcers    Hypertension    IBS (irritable bowel syndrome)    LVH (left ventricular  hypertrophy)    OA (osteoarthritis)    "all over" (05/11/2017)   Obesities, morbid (HCC)    OSA (obstructive sleep apnea)    PSG 03/30/97 AHI 21, BPAP 13/9   OSA on CPAP    PAF (paroxysmal atrial fibrillation) (HCC)    a. on Xarelto  b. s/p DCCV in 08/2016; b. Tikosyn  failed 04/16/17 with plans for Multaq  and possible Afib ablation with Dr. Nunzio Belch   Pneumonia    'several times" (05/11/2017)   Presence of Watchman left atrial appendage closure device 06/15/2022   24mm Watchman FLX Pro placed by Dr. Marven Slimmer   Prostate [facility])    Oncologist  DR. Cristal Don [facility] dx 09/24/14, undetermined tx   prostate; S/P "radiation and hormone injections"   Pulmonary embolism (HCC) 2008   "both lungs"   SOB (shortness of breath)    on excertion   Thoracic aortic aneurysm (HCC)    Aortic Size Index=     5.0    /Body surface area is 2.43 meters squared. = 2.05  < 2.75 cm/m2      4% risk per year 2.75 to 4.25          8% risk per year > 4.25 cm/m2    20% risk per year   Stable aneurysmal dilation of the ascending aorta with maximum AP diameter of 4.8 cm. Stable area of narrowing of the proximal most portion of the descending aorta measuring 2 cm., previously identified as an area of coarctation. No evidence of aortic dissection.  Coronary artery disease.  Normal appearance of the lungs.   Electronically Signed   By: Dobrinka  Dimitrova M.D.   On: 10/01/2014 08:50     Type II diabetes mellitus (HCC)    metphormin, average 154 dx 2017    Past Surgical History:  Procedure Laterality Date   ACHILLES TENDON REPAIR Bilateral    AORTIC ARCH ANGIOGRAPHY N/A 03/13/2017   Procedure: AORTIC ARCH ANGIOGRAPHY;  Surgeon: [name], [name], MD;  Location: [facility] INVASIVE CV LAB;  Service: Cardiovascular;  Laterality: N/A;   APPENDECTOMY     ATRIAL FIBRILLATION ABLATION  05/11/2017   ATRIAL FIBRILLATION ABLATION N/A 05/11/2017   Procedure: ATRIAL FIBRILLATION ABLATION;  Surgeon: Jolly Needle, MD;  Location: [facility] INVASIVE CV LAB;   Service: Cardiovascular;  Laterality: N/A;   BACK SURGERY     "I've had 7 back and 1 neck ORs" (05/11/2017)   BIOPSY  03/14/2018   Procedure: BIOPSY;  Surgeon: Genell Ken, MD;  Location: [facility] ENDOSCOPY;  Service: Gastroenterology;;  EGD and Colon   CARDIAC CATHETERIZATION  2006   CARPAL TUNNEL RELEASE Bilateral    LEFT   CATARACT EXTRACTION W/ INTRAOCULAR LENS  IMPLANT, BILATERAL Bilateral    CERVICAL SPINE SURGERY  06/02/2010   lower back and neck   COLONOSCOPY N/A 03/14/2018   Procedure: COLONOSCOPY;  Surgeon: Genell Ken, MD;  Location: [facility] ENDOSCOPY;  Service: Gastroenterology;  Laterality: N/A;   COLONOSCOPY WITH PROPOFOL  N/A 12/29/2014   Procedure: COLONOSCOPY WITH PROPOFOL ;  Surgeon: Garrett Kallman, MD;  Location: [facility] ENDOSCOPY;  Service:  Endoscopy;  Laterality: N/A;   CORONARY ANGIOPLASTY WITH STENT PLACEMENT  October 2012   CORONARY STENT PLACEMENT  Sept 2012   2nd OM with BMS   ESOPHAGOGASTRODUODENOSCOPY N/A 03/14/2018   Procedure: ESOPHAGOGASTRODUODENOSCOPY (EGD);  Surgeon: Genell Ken, MD;  Location: [facility];  Service: Gastroenterology;  Laterality: N/A;   HEMORROIDECTOMY     LAMINECTOMY  05/30/2012   L 4 L5   LAMINECTOMY WITH POSTERIOR LATERAL ARTHRODESIS LEVEL 3 N/A 10/18/2016   Procedure: Posterior Lateral Fusion - Lumbar One-Four, segmental instrumentation Lumbar One-Five,  decompression,;  Surgeon: Isadora Mar, MD;  Location: [facility] OR;  Service: Neurosurgery;  Laterality: N/A;   LAPAROSCOPIC CHOLECYSTECTOMY     LAPAROSCOPIC GASTRIC BANDING     LEFT AND RIGHT HEART CATHETERIZATION WITH CORONARY ANGIOGRAM N/A 05/07/2014   Procedure: LEFT AND RIGHT HEART CATHETERIZATION WITH CORONARY ANGIOGRAM;  Surgeon: [name] [name], MD;  Location: [facility] CATH LAB;  Service: Cardiovascular;  Laterality: N/A;   LEFT ATRIAL APPENDAGE OCCLUSION N/A 06/15/2022   Procedure: LEFT ATRIAL APPENDAGE OCCLUSION;  Surgeon: Boyce Byes, MD;  Location: [facility] INVASIVE CV LAB;  Service: Cardiovascular;   Laterality: N/A;   LEFT HEART CATH AND CORONARY ANGIOGRAPHY N/A 03/13/2017   Procedure: LEFT HEART CATH AND CORONARY ANGIOGRAPHY;  Surgeon: [name], [name], MD;  Location: [facility] INVASIVE CV LAB;  Service: Cardiovascular;  Laterality: N/A;   LUMBAR LAMINECTOMY/DECOMPRESSION MICRODISCECTOMY N/A 05/04/2016   Procedure: Laminectomy and Foraminotomy - Thoracic twelve-Lumbar one -Posterior Fusion Lumbar one-two;  Surgeon: Isadora Mar, MD;  Location: [facility] OR;  Service: Neurosurgery;  Laterality: N/A;   POLYPECTOMY  03/14/2018   Procedure: POLYPECTOMY;  Surgeon: Genell Ken, MD;  Location: [facility] ENDOSCOPY;  Service: Gastroenterology;;   POSTERIOR LUMBAR FUSION  10/18/2016   SHOULDER OPEN ROTATOR CUFF REPAIR Bilateral    TEE WITHOUT CARDIOVERSION N/A 06/15/2022   Procedure: TRANSESOPHAGEAL ECHOCARDIOGRAM;  Surgeon: Boyce Byes, MD;  Location: [facility] INVASIVE CV LAB;  Service: Cardiovascular;  Laterality: N/A;   TONSILLECTOMY AND ADENOIDECTOMY     TOTAL HIP ARTHROPLASTY Right 10/18/2018   Procedure: RIGHT TOTAL HIP ARTHROPLASTY ANTERIOR APPROACH;  Surgeon: Arnie Lao, MD;  Location: [facility] ORS;  Service: Orthopedics;  Laterality: Right;   TOTAL HIP ARTHROPLASTY Left 06/11/2023   Procedure: ARTHROPLASTY, HIP, TOTAL, ANTERIOR APPROACH;  Surgeon: Liliane Rei, MD;  Location: [facility] ORS;  Service: Orthopedics;  Laterality: Left;   TRIGGER FINGER RELEASE     LEFT   UVULOPALATOPHARYNGOPLASTY     VASECTOMY      Inpatient Medications: Scheduled Meds:  amiodarone  200 mg Oral BID   Chlorhexidine  Gluconate Cloth  6 each Topical Daily   docusate sodium   100 mg Oral BID   empagliflozin   25 mg Oral Daily   fluticasone  furoate-vilanterol  1 puff Inhalation Daily   insulin  aspart  0-15 Units Subcutaneous TID WC   loratadine   10 mg Oral Daily   metoprolol  tartrate  25 mg Oral BID   pantoprazole   40 mg Oral Daily   potassium chloride   20 mEq Oral QPM   potassium chloride  SA  40 mEq Oral Daily   rivaroxaban   10  mg Oral Q breakfast   tamsulosin   0.4 mg Oral QPM   topiramate   50 mg Oral BID   tranexamic acid  (CYKLOKAPRON ) 2,000 mg in sodium chloride  0.9 % 50 mL Topical Application  2,000 mg Topical Once   umeclidinium bromide   1 puff Inhalation Daily   Continuous Infusions:  sodium chloride  Stopped (06/13/23 1818)   amiodarone  30 mg/hr (06/15/23 0529)   PRN Meds: acetaminophen , albuterol , bisacodyl , cyclobenzaprine , HYDROcodone -acetaminophen , HYDROcodone -acetaminophen , HYDROmorphone  (DILAUDID ) injection, magnesium  citrate, menthol -cetylpyridinium **OR** phenol, metoCLOPramide  **OR** metoCLOPramide  (REGLAN ) injection, nitroGLYCERIN , ondansetron  **OR** ondansetron  (ZOFRAN ) IV, mouth rinse, polyethylene glycol, simethicone   Allergies:    Allergies  Allergen Reactions   Lotensin [Benazepril] Cough   Tofranil [Imipramine]     Weight gain   Ace Inhibitors Cough   Adhesive [Tape] Itching and Rash   Amoxicillin -Pot Clavulanate Other (See Comments)    GI Upset (intolerance)   Codeine Nausea Only   Latex Itching, Rash and Other (See Comments)    Bandaids   Metformin  Diarrhea   Morphine Itching   Nifedipine Swelling     leg edema   Quinolones Other (See Comments)    Patient was warned about not using Cipro and similar antibiotics. Recent studies have raised concern that fluoroquinolone antibiotics could be associated with an increased risk of aortic aneurysm Fluoroquinolones have non-antimicrobial properties that might jeopardise the integrity of the extracellular matrix of the vascular wall In a  propensity score matched cohort study in [place], there was a 66% increased rate of aortic aneurysm or dissection associated with oral fluoroquinolone use, compared wit    Social History:   Social History   Socioeconomic History   Marital status: Married    Spouse name: Not on file   Number of children: 3   Years of education: Not on file   Highest education level: Not on file  Occupational History    Occupation: Retired from Investment banker, corporate: RETIRED  Tobacco Use   Smoking status: Former    Current packs/day: 0.00    Average packs/day: 1.5 packs/day for 36.0 years (54.0 ttl pk-yrs)    Types: Cigarettes    Start date: 46    Quit date: 01/24/1992    Years since quitting: 31.4   Smokeless tobacco: Never  Vaping Use   Vaping status: Never Used  Substance and Sexual Activity   Alcohol use: No   Drug use: No   Sexual activity: Not Currently  Other Topics Concern   Not on file  Social History Narrative   Not on file   Social Drivers of Health   Financial Resource Strain: Not on file  Food Insecurity: No Food Insecurity (06/11/2023)   Hunger Vital Sign    Worried About Running Out of Food in the Last Year: Never true    Ran Out of Food in the Last Year: Never true  Transportation Needs: No Transportation Needs (06/11/2023)   PRAPARE - Administrator, Civil Service (Medical): No    Lack of Transportation (Non-Medical): No  Physical Activity: Not on file  Stress: Not on file  Social Connections: Socially Integrated (06/11/2023)   Social Connection and Isolation Panel [NHANES]    Frequency of Communication with Friends and Family: Twice a week    Frequency of Social Gatherings with Friends and Family: Twice a week    Attends Religious Services: More than 4 times per year    Active Member of [employer] or Organizations: Yes    Attends Banker Meetings: More than 4 times per year    Marital Status: Married  Catering manager Violence: Not At Risk (06/11/2023)   Humiliation, Afraid, Rape, and Kick questionnaire    Fear of Current or Ex-Partner: No    Emotionally Abused: No    Physically Abused: No    Sexually Abused: No    Family History:   Family History  Problem Relation Age of Onset   Heart disease Mother    Diabetes Mother    Other Mother        stent placement   Emphysema Father 29   Heart attack Sister      ROS:  Please see the history of  present illness.  All other ROS reviewed and negative.     Physical Exam/Data:   Vitals:   06/15/23 0800 06/15/23 0807 06/15/23 0848 06/15/23 0900  BP: (!) 89/50   114/61  Pulse: 95   (!) 101  Resp: 19   13  Temp:   99.1 F (37.3 C)   TempSrc:   Oral   SpO2: 96% 92%  93%  Weight:      Height:        Intake/Output Summary (Last 24 hours) at 06/15/2023 0918 Last data filed at 06/15/2023 0529 Gross per 24 hour  Intake 163.87 ml  Output --  Net 163.87 ml      06/14/2023    6:29 PM 06/11/2023   11:06 AM 05/29/2023    8:31 AM  Last 3 Weights  Weight (lbs) 252 lb 10.4 oz 234 lb 235 lb  Weight (kg) 114.6 kg 106.142 kg 106.595 kg     Body mass index is 41.4 kg/m.  General:  Well nourished, well developed, in no acute distress. HEENT: normal Neck: Positive JVD Vascular: No carotid bruits; Distal pulses 2+ bilaterally Cardiac:  normal S1, S2; irregular Lungs:  clear to auscultation bilaterally, no wheezing, rhonchi or rales  Abd: soft, nontender, no hepatomegaly  Ext: Mild pitting edema Musculoskeletal:  No deformities, BUE and BLE strength normal and equal Skin: warm and dry  Neuro:  CNs 2-12 intact, no focal abnormalities noted Psych:  Normal affect   EKG:  The EKG was personally reviewed and demonstrates: Sinus heart rate 101, PACs.  RBBB no acute ST-T wave changes. Telemetry:  Telemetry was personally reviewed and demonstrates: Suspect underlying rhythm is sinus with frequent ectopy.  Heart rates fluctuating generally mildly tachycardic but improving now at times in the 80s to 110s  Relevant CV Studies: Echocardiogram 10/26/2022  1. Left ventricular ejection fraction, by estimation, is 60 to 65%. The  left ventricle has normal function. The left ventricle has no regional  wall motion abnormalities. There is mild concentric left ventricular  hypertrophy. Left ventricular diastolic  parameters are consistent with Grade I diastolic dysfunction (impaired  relaxation).  Elevated left atrial pressure. The average left ventricular  global longitudinal strain is -16.7 %. The global longitudinal strain is  abnormal.   2. Right ventricular systolic function is normal. The right ventricular  size is normal.   3. Left atrial size was mildly dilated.   4. The mitral valve is normal in structure. Trivial mitral valve  regurgitation. No evidence of mitral stenosis.   5. The aortic valve is bicuspid. Aortic valve regurgitation is not  visualized. Mild aortic valve stenosis.   6. Aortic dilatation noted. There is moderate dilatation of the aortic  root, measuring 47 mm. There is severe dilatation of the ascending aorta,  measuring 52 mm.   7. The inferior vena cava is normal in size with greater than 50%  respiratory variability, suggesting right atrial pressure of 3 mmHg.   Comparison(s): EF 60%, mild LVH, RVSP 47.2 mmHg, bicuspid AOV, mean 13.0,  peak 23.7 mmHg, AOV 47mm, asc aor 47 mm, minimal coarctation seen.    Laboratory Data:  High Sensitivity Troponin:  No results for input(s): "TROPONINIHS"  in the last 720 hours.   Chemistry Recent Labs  Lab 06/12/23 0317 06/14/23 1740  NA 135 133*  K 4.5 4.2  CL 105 107  CO2 22 20*  GLUCOSE 222* 143*  BUN 30* 31*  CREATININE 1.16 0.82  CALCIUM  7.9* 8.5*  MG  --  2.1  GFRNONAA >60 >60  ANIONGAP 8 6    No results for input(s): "PROT", "ALBUMIN ", "AST", "ALT", "ALKPHOS", "BILITOT" in the last 168 hours. Lipids No results for input(s): "CHOL", "TRIG", "HDL", "LABVLDL", "LDLCALC", "CHOLHDL" in the last 168 hours.  Hematology Recent Labs  Lab 06/12/23 0317 06/13/23 0316 06/14/23 0654  WBC 10.0 6.7 6.3  RBC 2.46* 2.23* 2.22*  HGB 8.9* 8.1* 8.2*  HCT 27.5* 24.8* 25.7*  MCV 111.8* 111.2* 115.8*  MCH 36.2* 36.3* 36.9*  MCHC 32.4 32.7 31.9  RDW 14.1 14.2 14.4  PLT 150 125* 127*   Thyroid  No results for input(s): "TSH", "FREET4" in the last 168 hours.  BNPNo results for input(s): "BNP", "PROBNP" in the  last 168 hours.  DDimer No results for input(s): "DDIMER" in the last 168 hours.   Radiology/Studies:  DG CHEST PORT 1 VIEW Result Date: 06/14/2023 CLINICAL DATA:  Shortness of breath. EXAM: PORTABLE CHEST 1 VIEW COMPARISON:  Radiograph 06/15/2022 FINDINGS: Cardiomegaly is stable. Unchanged mediastinal contours. Streaky atelectasis in both lung bases. No confluent airspace disease. No pulmonary edema, large pleural effusion or pneumothorax. Thoracolumbar spinal fusion hardware is partially included in the field of view. IMPRESSION: 1. Streaky atelectasis in both lung bases. 2. Stable cardiomegaly. Electronically Signed   By: Chadwick Colonel M.D.   On: 06/14/2023 18:16   DG Pelvis Portable Result Date: 06/11/2023 CLINICAL DATA:  Status post total left hip arthroplasty. EXAM: PORTABLE PELVIS 1-2 VIEWS COMPARISON:  KUB 04/10/2023 FINDINGS: Interval total left hip arthroplasty. Partial visualization of prior total right hip arthroplasty. There are again two right sacroiliac fusion bars. Partial visualization of lumbar posterior rod and screw fusion. No perihardware lucency is seen to indicate hardware failure or loosening. Expected postoperative lateral left hip subcutaneous air. The pubic symphysis joint space is maintained. Partial visualization of a gastric band. IMPRESSION: Interval total left hip arthroplasty without evidence of hardware failure. Electronically Signed   By: Bertina Broccoli M.D.   On: 06/11/2023 16:20   DG HIP UNILAT WITH PELVIS 1V LEFT Result Date: 06/11/2023 CLINICAL DATA:  Intraoperative fluoroscopy for total left hip arthroplasty. EXAM: DG HIP (WITH OR WITHOUT PELVIS) 1V*L* COMPARISON:  CT abdomen and pelvis 03/24/2022 FINDINGS: Images were performed intraoperatively without the presence of a radiologist. The patient is undergoing total left hip arthroplasty. Partial visualization of total right hip arthroplasty. There are two right sacroiliac joint fusion bars again noted. No  hardware complication is seen. Total fluoroscopy images: 5 Total fluoroscopy time: 12 seconds Total dose: Radiation Exposure Index (as provided by the fluoroscopic device): 2.1 mGy air Kerma Please see intraoperative findings for further detail. IMPRESSION: Intraoperative fluoroscopy for total left hip arthroplasty. Electronically Signed   By: Bertina Broccoli M.D.   On: 06/11/2023 16:16   DG C-Arm 1-60 Min-No Report Result Date: 06/11/2023 Fluoroscopy was utilized by the requesting physician.  No radiographic interpretation.     Assessment and Plan:   Hypotension, tachycardia, SOB Status post hip arthroplasty Hypotension has been a problem since his visit in February, still remains to be evident here but now with tachycardia and significant shortness of breath/fatigue since his surgery. Echocardiogram today is technically difficult but appears EF 70  to 75%, normal RV function, dilated ascending aorta measuring 46 mm. Hold antihypertensives Presumed low suspicion for PE since he's been on proph, but still could consider. Suspect anemia contributing, hemoglobin is fallen from 11.2 on 5/6 to 7.9 today.  Would transfuse for goal hemoglobin greater than 8.  Discussed with Dr. Eilene Grater, will give 1 unit PRBC today.  Given he is appearing volume overloaded, will also give IV Lasix   PAF Status post ablation April 2019/ Watchman's device May 2024 PTA has not been on any antiarrhythmic/DOAC.  There is question of atrial fibrillation here and started on amiodarone.  From review of EKG and telemetry, appears sinus rhythm with frequent PACs Currently he is on IV amiodarone, will discontinue Start low-dose Lopressor  Maintain K greater than 4, mag greater than 2  Acute on chronic diastolic heart failure  Echocardiogram today shows EF 70 to 75%, normal RV function.  He appears volume overloaded on exam, reports weight is up about 8 pounds from dry weight Will start IV Lasix  as above  CAD with BMS to second  OM in 2012 Cardiac catheterization 2019 showed patent stents.  No chest pain here. Continue with aspirin  Intolerant of statins, LDL 61, consider lipid clinic follow-up outpatient.   Thoracic aortic aneurysm 4.9 cm by CT, continue to follow outpatient.  Prior PE On Xarelto  10 mg for DVT prophylaxis  OSA on CPAP    Risk Assessment/Risk Scores:    New [place]  Heart Association (NYHA) Functional Class NYHA Class III  CHA2DS2-VASc Score = 6   This indicates a 9.7% annual risk of stroke. The patient's score is based upon: CHF History: 1 HTN History: 1 Diabetes History: 1 Stroke History: 0 Vascular Disease History: 1 Age Score: 2 Gender Score: 0     For questions or updates, please contact [facility] Please consult [email or web address] for contact info under    Signed, Burnetta Cart, PA-C  06/15/2023 9:18 AM   Patient seen and examined.  Agree with above documentation.  Carlos Dawson is an 83 year old male with a history of atrial fibrillation status post ablation in 2019 and Watchman in 05/2022 (due to hematuria), CAD status post PCI in 2012, chronic diastolic heart failure, PE, OSA, T2DM, thoracic aortic aneurysm who we are consulted for evaluation of possible atrial fibrillation at the request of Dr. France Ina.  He was admitted for surgery on left hip.  Postoperatively he was noted to have hypotension, tachycardia, and dyspnea.  He was on losartan  and spironolactone  but these have been stopped.  Yesterday [facility] was consulted, felt to be in A-fib and started on Amio drip and heparin  and given IV fluids for hypotension.  Today BP was down to 89/64, pulse in 90s to 100s.  SpO2 95% on 2 L.  Labs today notable for creatinine 1.18, sodium 134, potassium 3.9, magnesium  2.2.  Hemoglobin is 7.9 (down from 11.2 on 05/29/2023).  EKG shows sinus rhythm with PACs, rate 101 bpm.  Review of telemetry shows he has been in sinus with PACs, no clear A-fib seen.  Echocardiogram today is technically  difficult but appears EF 70 to 75%, normal RV function, dilated ascending aorta measuring 46 mm. On exam, patient is alert and oriented, irregular rhythm, tachycardic no murmurs, basilar crackles, trace lower extremity edema, + JVD.  For his tachycardia, appears sinus with PACs, no A-fib seen.  Will discontinue amiodarone.  Start low-dose Lopressor .  His BP is soft and hemoglobin has declined significantly, recommend transfusing 1 unit PRBCs for goal hemoglobin greater  than 8.  He does appear volume overloaded on exam, will give IV Lasix  as well.  Wendie Hamburg, MD

## 2023-06-15 NOTE — Progress Notes (Signed)
 OT Cancellation Note  Patient Details Name: Carlos Dawson MRN: [id number] DOB: [birth year]   Cancelled Treatment:    Reason Eval/Treat Not Completed: Medical issues which prohibited therapy Care coordination with team. transferred to SDU, still on pressors with soft BP, nursing requests hold OT for today.   Henrick Mcgue OT/L Acute Rehabilitation Department  [phone]    06/15/2023, 12:52 PM

## 2023-06-15 NOTE — Progress Notes (Signed)
 Pt placed on CPAP at this time with 2L O2 Bleed In. Pt is using our machine and our mask with his own tubing. Pt is tolerating well. No distress noted. Will continue to monitor.       06/15/23 2331  BiPAP/CPAP/SIPAP  $ Face Mask Medium Yes  BiPAP/CPAP/SIPAP Pt Type Adult  BiPAP/CPAP/SIPAP (S)  Resmed  Mask Type Full face mask  Dentures removed? Not applicable  Mask Size Medium  Pressure Support 6 cmH20  FiO2 (%) 28 %  Patient Home Machine No  Patient Home Mask No  Patient Home Tubing Yes  Auto Titrate Yes  Minimum cmH2O 10 cmH2O  Maximum cmH2O 4 cmH2O  CPAP/SIPAP surface wiped down Yes  Device Plugged into RED Power Outlet Yes  BiPAP/CPAP /SiPAP Vitals  Pulse Rate 93  Resp 15  SpO2 96 %  MEWS Score/Color  MEWS Score 0  MEWS Score Color Marrie Sizer

## 2023-06-15 NOTE — Plan of Care (Signed)
  Problem: Clinical Measurements: Goal: Respiratory complications will improve Outcome: Progressing   Problem: Clinical Measurements: Goal: Ability to maintain clinical measurements within normal limits will improve Outcome: Progressing   Problem: Activity: Goal: Risk for activity intolerance will decrease Outcome: Progressing   Problem: Pain Managment: Goal: General experience of comfort will improve and/or be controlled Outcome: Progressing

## 2023-06-15 NOTE — Progress Notes (Signed)
 PT Cancellation Note  Patient Details Name: Carlos Dawson MRN: [id number] DOB: 09-03-1940   Cancelled Treatment:    Reason Eval/Treat Not Completed: Medical issues which prohibited therapy Transferred to SDU, on pressors, soft BP. Per RN, hold PT today.  Abelina Hoes PT Acute Rehabilitation Services Office [phone]   Dareen Ebbing 06/15/2023, 12:20 PM

## 2023-06-15 NOTE — Plan of Care (Signed)
   Problem: Education: Goal: Knowledge of General Education information will improve Description Including pain rating scale, medication(s)/side effects and non-pharmacologic comfort measures Outcome: Progressing   Problem: Health Behavior/Discharge Planning: Goal: Ability to manage health-related needs will improve Outcome: Progressing

## 2023-06-15 NOTE — Consult Note (Signed)
 WOC Nurse Consult Note: Reason for Consult: DTPI  Wound type: Deep Tissue Pressure Injury sacrum/buttocks  Pressure Injury POA: Yes Measurement: see nursing flowsheet  Wound bed: purple maroon discoloration  Drainage (amount, consistency, odor) dry  Periwound: erythema, some maceration  Dressing procedure/placement/frequency: Cleanse sacrum/buttocks with soap and water , dry and apply Xeroform gauze (Lawson [id number]) to purple maroon discoloration daily and secure with silicone foam.    POC discussed with bedside nurse. DTPI are high risk for deterioration, WOC team will not follow. Please reconsult if develops necrotic (black/brown/tan) tissue.    Thank you,    Ronni Colace MSN, RN-BC, [employer] [phone]

## 2023-06-16 ENCOUNTER — Inpatient Hospital Stay (HOSPITAL_COMMUNITY)

## 2023-06-16 DIAGNOSIS — I4729 Other ventricular tachycardia: Secondary | ICD-10-CM | POA: Diagnosis not present

## 2023-06-16 DIAGNOSIS — I5033 Acute on chronic diastolic (congestive) heart failure: Secondary | ICD-10-CM | POA: Diagnosis not present

## 2023-06-16 DIAGNOSIS — I480 Paroxysmal atrial fibrillation: Secondary | ICD-10-CM | POA: Diagnosis not present

## 2023-06-16 DIAGNOSIS — R000 Tachycardia, unspecified: Secondary | ICD-10-CM | POA: Diagnosis not present

## 2023-06-16 DIAGNOSIS — I959 Hypotension, unspecified: Secondary | ICD-10-CM | POA: Diagnosis not present

## 2023-06-16 LAB — BASIC METABOLIC PANEL WITH GFR
Anion gap: 5 (ref 5–15)
BUN: 32 mg/dL — ABNORMAL HIGH (ref 8–23)
CO2: 23 mmol/L (ref 22–32)
Calcium: 8.4 mg/dL — ABNORMAL LOW (ref 8.9–10.3)
Chloride: 106 mmol/L (ref 98–111)
Creatinine, Ser: 1.13 mg/dL (ref 0.61–1.24)
GFR, Estimated: >60 mL/min (ref >60)
Glucose, Bld: 159 mg/dL — ABNORMAL HIGH (ref 70–99)
Potassium: 3.5 mmol/L (ref 3.5–5.1)
Sodium: 134 mmol/L — ABNORMAL LOW (ref 135–145)

## 2023-06-16 LAB — CBC
HCT: 28.5 % — ABNORMAL LOW (ref 39.0–52.0)
Hemoglobin: 9.1 g/dL — ABNORMAL LOW (ref 13.0–17.0)
MCH: 35 pg — ABNORMAL HIGH (ref 26.0–34.0)
MCHC: 31.9 g/dL (ref 30.0–36.0)
MCV: 109.6 fL — ABNORMAL HIGH (ref 80.0–100.0)
Platelets: 163 10*3/uL (ref 150–400)
RBC: 2.6 MIL/uL — ABNORMAL LOW (ref 4.22–5.81)
RDW: 17.3 % — ABNORMAL HIGH (ref 11.5–15.5)
WBC: 8.1 10*3/uL (ref 4.0–10.5)
nRBC: 1.6 % — ABNORMAL HIGH (ref 0.0–0.2)

## 2023-06-16 LAB — GLUCOSE, CAPILLARY
Glucose-Capillary: 139 mg/dL — ABNORMAL HIGH (ref 70–99)
Glucose-Capillary: 189 mg/dL — ABNORMAL HIGH (ref 70–99)
Glucose-Capillary: 126 mg/dL — ABNORMAL HIGH (ref 70–99)
Glucose-Capillary: 154 mg/dL — ABNORMAL HIGH (ref 70–99)

## 2023-06-16 LAB — BRAIN NATRIURETIC PEPTIDE: B Natriuretic Peptide: 342.4 pg/mL — ABNORMAL HIGH (ref 0.0–100.0)

## 2023-06-16 LAB — MAGNESIUM: Magnesium: 2.2 mg/dL (ref 1.7–2.4)

## 2023-06-16 MED ORDER — FUROSEMIDE 10 MG/ML IJ SOLN
40.0000 mg | Freq: Once | INTRAMUSCULAR | Status: AC
  Administered 2023-06-16: 40 mg via INTRAVENOUS
  Filled 2023-06-16: qty 4

## 2023-06-16 MED ORDER — FUROSEMIDE 10 MG/ML IJ SOLN
40.0000 mg | Freq: Two times a day (BID) | INTRAMUSCULAR | Status: DC
  Administered 2023-06-16 – 2023-06-17 (×3): 40 mg via INTRAVENOUS
  Filled 2023-06-16 (×4): qty 4

## 2023-06-16 NOTE — Plan of Care (Signed)
  Problem: Health Behavior/Discharge Planning: Goal: Ability to manage health-related needs will improve Outcome: Progressing   Problem: Education: Goal: Knowledge of General Education information will improve Description: Including pain rating scale, medication(s)/side effects and non-pharmacologic comfort measures Outcome: Progressing   Problem: Clinical Measurements: Goal: Ability to maintain clinical measurements within normal limits will improve Outcome: Progressing   

## 2023-06-16 NOTE — Progress Notes (Signed)
 Subjective: 5 Days Post-Op Procedure(s) (LRB): ARTHROPLASTY, HIP, TOTAL, ANTERIOR APPROACH (Left) Patient reports pain as mild.   Patient reports hip pain controlled.   Objective: Vital signs in last 24 hours: Temp:  [97.3 F (36.3 C)-99.1 F (37.3 C)] 97.9 F (36.6 C) (05/24 0814) Pulse Rate:  [78-116] 98 (05/24 0900) Resp:  [11-36] 13 (05/24 0900) BP: (89-151)/(48-101) 136/101 (05/24 0900) SpO2:  [83 %-100 %] 97 % (05/24 0900) FiO2 (%):  [28 %] 28 % (05/23 2331) Weight:  [116.3 kg] 116.3 kg (05/24 0500)  Intake/Output from previous day:  Intake/Output Summary (Last 24 hours) at 06/16/2023 1011 Last data filed at 06/15/2023 2220 Gross per 24 hour  Intake 673.57 ml  Output 1550 ml  Net -876.43 ml    Intake/Output this shift: No intake/output data recorded.  Labs: Recent Labs    06/14/23 0654 06/15/23 0958 06/15/23 2201 06/16/23 0314  HGB 8.2* 7.9* 8.7* 9.1*   Recent Labs    06/15/23 0958 06/15/23 2201 06/16/23 0314  WBC 9.8  --  8.1  RBC 1.97*  --  2.60*  HCT 22.7* 26.8* 28.5*  PLT 148*  --  163   Recent Labs    06/15/23 0958 06/16/23 0314  NA 134* 134*  K 3.9 3.5  CL 108 106  CO2 21* 23  BUN 33* 32*  CREATININE 1.18 1.13  GLUCOSE 150* 159*  CALCIUM  8.3* 8.4*   No results for input(s): "LABPT", "INR" in the last 72 hours.  Exam: General - Patient is Alert and Oriented Extremity - Neurologically intact Neurovascular intact Sensation intact distally Dorsiflexion/Plantar flexion intact Dressing/Incision - clean, dry, no drainage Motor Function - intact, moving foot and toes well on exam.   Past Medical History:  Diagnosis Date   Anticoagulant long-term use    Aortic root enlargement (HCC)    Ascending aortic aneurysm (HCC)    recent scan in October 2012 showing no change; followed by Dr. Nicanor Barge   ASCVD (arteriosclerotic cardiovascular disease)    Prior BMS to the 2nd OM in September 2012; with repeat cath in October showing patency    CAD (coronary artery disease)    a. s/p BMS to 2nd OM in Sept 2012; b. LexiScan  Myoview  (12/2012):  Inf infarct; bowel and motion artifact make study difficult to interpret; no ischemia; not gated; Low Risk   CHF (congestive heart failure) (HCC)    no recent issues 10/13/14   Chronic back pain    "all over my back" (05/11/2017)   Colonic polyp    Contact lens/glasses fitting    COPD (chronic obstructive pulmonary disease) (HCC)    Diastolic dysfunction    DVT (deep venous thrombosis) (HCC)    ?LLE   GERD (gastroesophageal reflux disease)    Hearing loss    Hearing loss    more so on left   Hemorrhoids    History of stomach ulcers    Hypertension    IBS (irritable bowel syndrome)    LVH (left ventricular hypertrophy)    OA (osteoarthritis)    "all over" (05/11/2017)   Obesities, morbid (HCC)    OSA (obstructive sleep apnea)    PSG 03/30/97 AHI 21, BPAP 13/9   OSA on CPAP    PAF (paroxysmal atrial fibrillation) (HCC)    a. on Xarelto  b. s/p DCCV in 08/2016; b. Tikosyn  failed 04/16/17 with plans for Multaq  and possible Afib ablation with Dr. Nunzio Belch   Pneumonia    'several times" (05/11/2017)   Presence of Watchman  left atrial appendage closure device 06/15/2022   24mm Watchman FLX Pro placed by Dr. Marven Slimmer   Prostate [facility])    Oncologist  DR. Cristal [facility] 09/24/14, undetermined tx   prostate; S/P "radiation and hormone injections"   Pulmonary embolism (HCC) 2008   "both lungs"   SOB (shortness of breath)    on excertion   Thoracic aortic aneurysm (HCC)    Aortic Size Index=     5.0    /Body surface area is 2.43 meters squared. = 2.05  < 2.75 cm/m2      4% risk per year 2.75 to 4.25          8% risk per year > 4.25 cm/m2    20% risk per year   Stable aneurysmal dilation of the ascending aorta with maximum AP diameter of 4.8 cm. Stable area of narrowing of the proximal most portion of the descending aorta measuring 2 cm., previously identified as an area of coarctation. No  evidence of aortic dissection.  Coronary artery disease.  Normal appearance of the lungs.   Electronically Signed   By: Dobrinka  Dimitrova M.D.   On: 10/01/2014 08:50     Type II diabetes mellitus (HCC)    metphormin, average 154 dx 2017    Assessment/Plan: 5 Days Post-Op Procedure(s) (LRB): ARTHROPLASTY, HIP, TOTAL, ANTERIOR APPROACH (Left) Principal Problem:   NSVT (nonsustained ventricular tachycardia) (HCC) Active Problems:   OA (osteoarthritis) of hip   Paroxysmal atrial fibrillation (HCC)   Hypotension   SOB (shortness of breath)   Primary osteoarthritis of left hip   Status post hip replacement   Acute blood loss anemia  Estimated body mass index is 42.02 kg/m as calculated from the following:   Height as of this encounter: 5' 5.5" (1.664 m).   Weight as of this encounter: 116.3 kg.  DVT Prophylaxis - Xarelto  Weight-bearing as tolerated  PT when able. Very appreciative of hospitalists'  and cards assistance  Carter Clare, MD Orthopedic Surgery  06/16/2023, 10:11 AM

## 2023-06-16 NOTE — Progress Notes (Signed)
 Patients abdomen is becoming increasingly distended and is tender on assessment. Patient has had a bowel movement but has decreased urinary output despite receiving lasix . Bladder scan was done and resulted 0ml multiple times. MD made aware.   Carlos Dawson

## 2023-06-16 NOTE — Progress Notes (Signed)
 Physical Therapy Treatment Patient Details Name: Carlos Dawson MRN: [id number] DOB: April 20, 1940 Today's Date: 06/16/2023   History of Present Illness s a 83 y.o. male with medical history significant for CAD, diastolic CHF, COPD on room air, history of DVT, paroxysmal atrial fibrillation status post Watchman procedure, no longer on anticoagulation who was admitted to the hospital by the orthopedic service on 5/19 after successful left hip total arthroplasty who is now being seen in consultation due to concern for hypotension, tachycardia and dyspnea with exertion.   Transfer to SDU 5/22 due to A Fib    PT Comments  Pt seen in ICU RM# [id number] POD # 5 am session Assisted OOB to [facility] then amb all while monitoring vitals. General bed mobility comments: Required Mod Asisst + 2 esp with scooting to EOB in small increments as well as using bed pad to complete.  Increased c/o back pain with activity.  Hx multiple back surgeries. General transfer comment: Used B platform EVA walker this session for increased support.  Pt was able to rise from elevated bed at Mod Asisst + 2 for safety and complete 1/4 turn to [facility] per need for void/BM.  Sat on BSC x 8 min.  Assisted with standing and hygiene (watery loose light BM).  VC's on proper hand placement to "push self up" vs pull on walker.  VC's for safety with turn completion.General Gait Details: Used B platform EVA walker this session for increased support.  Required + 2 Mod/Min Assist for equipement/chair follow.  Distance limited by HR increased from 92 at rest to 161.  Pt asymptomatic.  Only c/o was fatigue.  Pt asissted to recliner and returned to room. Supine      BP 126/68(87)  HR 92  sats 2 lts 97% EOB          BP 124/58(79)  HR 103  sats 2 lts 98% Standing   BP 137/112(118) HR 139 sats 2 lts 99% BSC          BP 131/84(104)  HR 113 sats 2 lts 96% AMB 18 feet HR 161 activity ceased    If plan is discharge home, recommend the following: Assistance with  cooking/housework;Help with stairs or ramp for entrance;Assist for transportation;A lot of help with walking and/or transfers;A lot of help with bathing/dressing/bathroom   Can travel by private vehicle        Equipment Recommendations  None recommended by PT    Recommendations for Other Services       Precautions / Restrictions Precautions Precautions: Fall Restrictions Weight Bearing Restrictions Per Provider Order: No LLE Weight Bearing Per Provider Order: Weight bearing as tolerated     Mobility  Bed Mobility Overal bed mobility: Needs Assistance Bed Mobility: Supine to Sit     Supine to sit: Mod assist, +2 for safety/equipment     General bed mobility comments: Required Mod Asisst + 2 esp with scooting to EOB in small increments as well as using bed pad to complete.  Increased c/o back pain with activity.  Hx multiple back surgeries.    Transfers Overall transfer level: Needs assistance Equipment used: Bilateral platform walker Transfers: Sit to/from Stand Sit to Stand: Min assist, Mod assist, +2 safety/equipment           General transfer comment: Used B platform EVA walker this session for increased support.  Pt was able to rise from elevated bed at Mod Asisst + 2 for safety and complete 1/4 turn to [facility] per need  for void/BM.  Sat on BSC x 8 min.  Assisted with standing and hygiene (watery loose light BM).  VC's on proper hand placement to "push self up" vs pull on walker.  VC's for safety with turn completion.    Ambulation/Gait Ambulation/Gait assistance: Min assist, +2 physical assistance, +2 safety/equipment, Mod assist Gait Distance (Feet): 18 Feet Assistive device: Bilateral platform walker, Blanca Bunch Gait Pattern/deviations: Step-to pattern, Decreased step length - left, Antalgic Gait velocity: decreased     General Gait Details: Used B platform EVA walker this session for increased support.  Required + 2 Mod/Min Assist for equipement/chair follow.   Distance limited by HR increased from 92 at rest to 161.  Pt asymptomatic.  Only c/o was fatigue.  Pt asissted to recliner and returned to room.   Stairs             Wheelchair Mobility     Tilt Bed    Modified Rankin (Stroke Patients Only)       Balance                                            Communication Communication Communication: No apparent difficulties Factors Affecting Communication: Hearing impaired  Cognition Arousal: Alert Behavior During Therapy: WFL for tasks assessed/performed   PT - Cognitive impairments: No apparent impairments                       PT - Cognition Comments: AxO x 3 pleasant and motivated.  Pre Medicated with Tylenol  only to achieve optimal alertness.  Lives with Spouse and was IND/Driving. Following commands: Intact      Cueing Cueing Techniques: Verbal cues  Exercises      General Comments        Pertinent Vitals/Pain Pain Assessment Pain Assessment: 0-10 Pain Score: 7  Pain Location: L hip 7/10 and R mid back 6/10 "I've had ten back surgeries", stated Pt Pain Descriptors / Indicators: Discomfort, Grimacing, Operative site guarding Pain Intervention(s): Monitored during session, Premedicated before session, Repositioned, Ice applied, Patient requesting pain meds-RN notified    Home Living                          Prior Function            PT Goals (current goals can now be found in the care plan section) Progress towards PT goals: Progressing toward goals    Frequency    7X/week      PT Plan      Co-evaluation              AM-PAC PT "6 Clicks" Mobility   Outcome Measure  Help needed turning from your back to your side while in a flat bed without using bedrails?: A Lot Help needed moving from lying on your back to sitting on the side of a flat bed without using bedrails?: A Lot Help needed moving to and from a bed to a chair (including a wheelchair)?: A  Lot Help needed standing up from a chair using your arms (e.g., wheelchair or bedside chair)?: A Lot Help needed to walk in hospital room?: A Lot Help needed climbing 3-5 steps with a railing? : Total 6 Click Score: 11    End of Session Equipment Utilized During Treatment: Gait belt Activity Tolerance: Patient limited  by fatigue;Other (comment) (elevated HR) Patient left: in chair;with call bell/phone within reach;with family/visitor present Nurse Communication: Mobility status PT Visit Diagnosis: Unsteadiness on feet (R26.81);Other abnormalities of gait and mobility (R26.89);Muscle weakness (generalized) (M62.81);History of falling (Z91.81);Difficulty in walking, not elsewhere classified (R26.2);Pain Pain - Right/Left: Left Pain - part of body: Hip;Leg     Time: 1610-9604 PT Time Calculation (min) (ACUTE ONLY): 45 min  Charges:    $Gait Training: 8-22 mins $Therapeutic Activity: 23-37 mins PT General Charges $$ ACUTE PT VISIT: 1 Visit                     Bess Broody  PTA Acute  Rehabilitation Services Office M-F          [phone]

## 2023-06-16 NOTE — Progress Notes (Signed)
 PROGRESS NOTE    Patient: Carlos Dawson                            PCP: Tena Feeling, MD                    DOB: 12-May-1940            DOA: 06/11/2023 QIO:[id number]             DOS: 06/16/2023, 10:28 AM   LOS: 2 days   Date of Service: The patient was seen and examined on 06/16/2023  Subjective:   The patient was seen and examined this morning.  Alert oriented, heart rate 91-1 09, blood pressure as low as 89/50 currently this morning 117/48, satting 95% on 2 L of oxygen   - On amiodarone drip this morning, fluid normal saline   Brief Narrative:   Carlos Dawson is a 83 y.o. male with medical history significant for CAD, diastolic CHF, COPD on room air, history of DVT, paroxysmal atrial fibrillation status post Watchman procedure, no longer on anticoagulation who was admitted to the hospital by the orthopedic service on 5/19 after successful left hip total arthroplasty who is now being seen in consultation due to concern for hypotension, tachycardia and dyspnea with exertion.  .     Assessment & Plan:   Principal Problem:   NSVT (nonsustained ventricular tachycardia) (HCC) Active Problems:   OA (osteoarthritis) of hip   Hypotension   Status post hip replacement   Primary osteoarthritis of left hip   Paroxysmal atrial fibrillation (HCC)   SOB (shortness of breath)   Acute blood loss anemia    Tachycardia, with history of A-fib - Post hip arthroplasty patient was noted to be hypotensive, tachycardic thought to be in A-fib with RVR -History of paroxysmal A-fib s/p ablation May 11, 2017, Watchman device May 2024 - 06/15/2023 -started on amiodarone drip overnight, IV fluids - Cardiology consulted we discontinued amiodarone drip, continue metoprolol  PO - Electrolytes potassium magnesium  replaced - Heart rate and blood pressure has improved   History of paroxysmal atrial fibrillation, -  s/p ablation April 2019, Watchman device 2024 - 06/15/2023 discontinue IV  amiodarone, heparin  - Continue Lopressor  25 mg p.o. twice daily for rate control per cardiology - Appreciate cardiology following closely  Hypotensive -postop (Struve HTN) -Holding antihypertensive medications, stopping Jardiance  due to hypotension -Improved, continue metoprolol  25 mg twice daily - Pursuing with 1U PRBC blood transfusion  Total hip arthroplasty Postop day # 5 -Remains stable-pending PT evaluation today - Orthopedic team following, - Pain management per Ortho  History of CAD, -Denies any chest pain - With history of cardiac cath, stenting in 2012, - Continue home medication of aspirin , intolerant to statins  HFpEF - ?  Pulmonary hypertension - Last echo 11/12/22 ejection fraction 60-65%, 1 diastolic dysfunction - Cardiology mildly volume overloaded, will need to be diuresed, with blood pressures remain soft - Starting IV Lasix  40 mg -will continue as twice daily per cardiology recommendations  - Will monitor I's and O's  Intake/Output Summary (Last 24 hours) at 06/16/2023 1033 Last data filed at 06/15/2023 2220 Gross per 24 hour  Intake 673.57 ml  Output 1550 ml  Net -876.43 ml       Acute on chronic anemia -exacerbated postop and with IVF - Cardiology recommendation hemoglobin needs to be >8 Which also may have been contributed to hypotension, and tachycardia  Latest Ref Rng & Units 06/16/2023    3:14 AM 06/15/2023   10:01 PM 06/15/2023    9:58 AM  CBC  WBC 4.0 - 10.5 K/uL 8.1   9.8   Hemoglobin 13.0 - 17.0 g/dL 9.1  8.7  7.9   Hematocrit 39.0 - 52.0 % 28.5  26.8  22.7   Platelets 150 - 400 K/uL 163   148   - Hemoglobin improved along with the tachycardia and hypertension 06/15/23 s/p 1 unit PRBC blood transfusion  History of thoracic aortic aneurysm - 4.9 cm on last CT, continue follows outpatient  History of PE - Currently on  Xarelto        ----------------------------------------------------------------------------------------------------------------------------------------------- Nutritional status:  The patient's BMI is: Body mass index is 42.02 kg/m. I agree with the assessment and plan as outlined  Skin Assessment: I have examined the patient's skin and I agree with the wound assessment as performed by wound care team As outlined belowe: Pressure Injury 06/14/23 Buttocks Mid Deep Tissue Pressure Injury - Purple or maroon localized area of discolored intact skin or blood-filled blister due to damage of underlying soft tissue from pressure and/or shear. Area of non-blanching purple skin on (Active)  06/14/23 1830  Location: Buttocks  Location Orientation: Mid  Staging: Deep Tissue Pressure Injury - Purple or maroon localized area of discolored intact skin or blood-filled blister due to damage of underlying soft tissue from pressure and/or shear.  Wound Description (Comments): Area of non-blanching purple skin on buttocks  Present on Admission: Yes  Dressing Type Foam - Lift dressing to assess site every shift 06/16/23 0800    ---------------------------------------------------------------------------------------------------------------------  DVT prophylaxis:  rivaroxaban  (XARELTO ) tablet 10 mg Start: 06/12/23 0800 Place TED hose Start: 06/11/23 1651 SCDs Start: 06/11/23 1433 rivaroxaban  (XARELTO ) tablet 10 mg   Code Status:   Code Status: Full Code  Family Communication: No family member present at bedside-  -Advance care planning has been discussed.   Admission status:   Status is: Inpatient Remains inpatient appropriate because: Needing close monitoring in critical care, with A-fib, anemia, needing diuretics, blood transfusion   Disposition: From  - home             Planning for discharge in 2 -3  days: to Home    Procedures:   PR ARTHRP ACETBLR/PROX FEM PROSTC AGRFT/ALGRFT  [27130]   Antimicrobials:  Anti-infectives (From admission, onward)    Start     Dose/Rate Route Frequency Ordered Stop   06/11/23 1830  ceFAZolin  (ANCEF ) IVPB 2g/100 mL premix        2 g 200 mL/hr over 30 Minutes Intravenous Every 6 hours 06/11/23 1651 06/12/23 0747   06/11/23 1030  ceFAZolin  (ANCEF ) IVPB 2g/100 mL premix        2 g 200 mL/hr over 30 Minutes Intravenous On call to O.R. 06/11/23 1025 06/11/23 1300        Medication:   sodium chloride    Intravenous Once   Chlorhexidine  Gluconate Cloth  6 each Topical Daily   furosemide   40 mg Intravenous BID   insulin  aspart  0-15 Units Subcutaneous TID WC   loratadine   10 mg Oral Daily   metoprolol  tartrate  25 mg Oral BID   pantoprazole   40 mg Oral Daily   potassium chloride   20 mEq Oral QPM   potassium chloride  SA  40 mEq Oral Daily   rivaroxaban   10 mg Oral Q breakfast   sennosides  10 mL Oral BID   tamsulosin   0.4 mg  Oral QPM   topiramate   50 mg Oral BID   tranexamic acid  (CYKLOKAPRON ) 2,000 mg in sodium chloride  0.9 % 50 mL Topical Application  2,000 mg Topical Once    acetaminophen , albuterol , budesonide -formoterol , cyclobenzaprine , HYDROcodone -acetaminophen , HYDROcodone -acetaminophen , HYDROmorphone  (DILAUDID ) injection, magnesium  citrate, menthol -cetylpyridinium **OR** phenol, metoCLOPramide  **OR** metoCLOPramide  (REGLAN ) injection, nitroGLYCERIN , ondansetron  **OR** ondansetron  (ZOFRAN ) IV, mouth rinse, polyethylene glycol, simethicone , tiotropium   Objective:   Vitals:   06/16/23 0600 06/16/23 0800 06/16/23 0814 06/16/23 0900  BP: 134/71 125/81  (!) 136/101  Pulse: 94 83  98  Resp: 13 16  13   Temp:   97.9 F (36.6 C)   TempSrc:   Axillary   SpO2: 91% (!) 83%  97%  Weight:      Height:        Intake/Output Summary (Last 24 hours) at 06/16/2023 1028 Last data filed at 06/15/2023 2220 Gross per 24 hour  Intake 673.57 ml  Output 1550 ml  Net -876.43 ml   Filed Weights   06/11/23 1106 06/14/23 1829  06/16/23 0500  Weight: 106.1 kg 114.6 kg 116.3 kg     Physical examination:        General:  AAO x 3,  cooperative, no distress;   HEENT:  Normocephalic, PERRL, otherwise with in Normal limits   Neuro:  CNII-XII intact. , normal motor and sensation, reflexes intact   Lungs:   Clear to auscultation BL, Respirations unlabored,  No wheezes / crackles  Cardio:    S1/S2, RRR, No murmure, No Rubs or Gallops   Abdomen:  Soft, non-tender, bowel sounds active all four quadrants, no guarding or peritoneal signs.  Muscular  skeletal:  Limited exam -global generalized weaknesses - in bed, able to move all 4 extremities,   2+ pulses,  symmetric, +2 pitting edema  Skin:  Dry, warm to touch, negative for any Rashes,  Wounds: Please see nursing documentation  Pressure Injury 06/14/23 Buttocks Mid Deep Tissue Pressure Injury - Purple or maroon localized area of discolored intact skin or blood-filled blister due to damage of underlying soft tissue from pressure and/or shear. Area of non-blanching purple skin on (Active)  06/14/23 1830  Location: Buttocks  Location Orientation: Mid  Staging: Deep Tissue Pressure Injury - Purple or maroon localized area of discolored intact skin or blood-filled blister due to damage of underlying soft tissue from pressure and/or shear.  Wound Description (Comments): Area of non-blanching purple skin on buttocks  Present on Admission: Yes  Dressing Type Foam - Lift dressing to assess site every shift 06/16/23 0800           ------------------------------------------------------------------------------------------------------------------------------------------    LABs:     Latest Ref Rng & Units 06/16/2023    3:14 AM 06/15/2023   10:01 PM 06/15/2023    9:58 AM  CBC  WBC 4.0 - 10.5 K/uL 8.1   9.8   Hemoglobin 13.0 - 17.0 g/dL 9.1  8.7  7.9   Hematocrit 39.0 - 52.0 % 28.5  26.8  22.7   Platelets 150 - 400 K/uL 163   148       Latest Ref Rng & Units  06/16/2023    3:14 AM 06/15/2023    9:58 AM 06/14/2023    5:40 PM  CMP  Glucose 70 - 99 mg/dL 098  119  147   BUN 8 - 23 mg/dL 32  33  31   Creatinine 0.61 - 1.24 mg/dL 8.29  5.62  1.30   Sodium 135 - 145 mmol/L 134  134  133   Potassium 3.5 - 5.1 mmol/L 3.5  3.9  4.2   Chloride 98 - 111 mmol/L 106  108  107   CO2 22 - 32 mmol/L 23  21  20    Calcium  8.9 - 10.3 mg/dL 8.4  8.3  8.5        Micro Results Recent Results (from the past 240 hours)  MRSA Next Gen by PCR, Nasal     Status: None   Collection Time: 06/14/23  6:32 PM   Specimen: Nasal Mucosa; Nasal Swab  Result Value Ref Range Status   MRSA by PCR Next Gen NOT DETECTED NOT DETECTED Final    Comment: (NOTE) The GeneXpert MRSA Assay (FDA approved for NASAL specimens only), is one component of a comprehensive MRSA colonization surveillance program. It is not intended to diagnose MRSA infection nor to guide or monitor treatment for MRSA infections. Test performance is not FDA approved in patients less than 31 years old. Performed at [facility], [address], [place], [place] [address]     Radiology Reports No results found.   SIGNED: Bobbetta Burnet, MD, FHM. FAAFP. Arlin Benes - [facility] Critical care time spent - 55 min.  In seeing, evaluating and examining the patient. Reviewing medical records, labs, drawn plan of care. [facility],  Pager (please use [email or web address] to page/ text) Please use Epic Secure Chat for non-urgent communication (7AM-7PM)  If 7PM-7AM, please contact night-coverage [email or web address], 06/16/2023, 10:28 AM

## 2023-06-16 NOTE — Progress Notes (Signed)
 OT Cancellation Note  Patient Details Name: Carlos Dawson MRN: [id number] DOB: [birth year]   Cancelled Treatment:    Reason Eval/Treat Not Completed: Fatigue/lethargy limiting ability to participate Approached patient in AM with patient eating brekfast. This afternoon, patient is deep sleeping in recliner with audible snoring. OT to continue to follow and check back as schedule will allow.   Wynette Heckler, MS Acute Rehabilitation Department Office# [phone]  06/16/2023, 2:05 PM

## 2023-06-16 NOTE — Progress Notes (Signed)
 Rounding Note    Patient Name: Carlos Dawson Date of Encounter: 06/16/2023  [facility] Cardiologist: Peter [name], MD   Subjective   BP 136/101.  Creatinine stable at 1.1.  Net -800 cc yesterday.  Status post 1 unit PRBCs, hemoglobin improved to 9.1.  Denies any chest pain, reports dyspnea improving  Inpatient Medications    Scheduled Meds:  sodium chloride    Intravenous Once   Chlorhexidine  Gluconate Cloth  6 each Topical Daily   insulin  aspart  0-15 Units Subcutaneous TID WC   loratadine   10 mg Oral Daily   metoprolol  tartrate  25 mg Oral BID   pantoprazole   40 mg Oral Daily   potassium chloride   20 mEq Oral QPM   potassium chloride  SA  40 mEq Oral Daily   rivaroxaban   10 mg Oral Q breakfast   sennosides  10 mL Oral BID   tamsulosin   0.4 mg Oral QPM   topiramate   50 mg Oral BID   tranexamic acid  (CYKLOKAPRON ) 2,000 mg in sodium chloride  0.9 % 50 mL Topical Application  2,000 mg Topical Once   Continuous Infusions:  PRN Meds: acetaminophen , albuterol , budesonide -formoterol , cyclobenzaprine , HYDROcodone -acetaminophen , HYDROcodone -acetaminophen , HYDROmorphone  (DILAUDID ) injection, magnesium  citrate, menthol -cetylpyridinium **OR** phenol, metoCLOPramide  **OR** metoCLOPramide  (REGLAN ) injection, nitroGLYCERIN , ondansetron  **OR** ondansetron  (ZOFRAN ) IV, mouth rinse, polyethylene glycol, simethicone , tiotropium   Vital Signs    Vitals:   06/16/23 0600 06/16/23 0800 06/16/23 0814 06/16/23 0900  BP: 134/71 125/81  (!) 136/101  Pulse: 94 83  98  Resp: 13 16  13   Temp:   97.9 F (36.6 C)   TempSrc:   Axillary   SpO2: 91% (!) 83%  97%  Weight:      Height:        Intake/Output Summary (Last 24 hours) at 06/16/2023 0923 Last data filed at 06/15/2023 2220 Gross per 24 hour  Intake 741.19 ml  Output 1550 ml  Net -808.81 ml      06/16/2023    5:00 AM 06/14/2023    6:29 PM 06/11/2023   11:06 AM  Last 3 Weights  Weight (lbs) 256 lb 6.3 oz 252 lb 10.4 oz 234  lb  Weight (kg) 116.3 kg 114.6 kg 106.142 kg      Telemetry    Sinus with frequent PACs, rate 80s- Personally Reviewed  ECG    No new ECG- Personally Reviewed  Physical Exam   GEN: No acute distress.   Neck: + JVD Cardiac: Irregular, normal rate no murmurs, rubs, or gallops.  Respiratory: Breath sounds GI: Distended MS: 1+ edema; No deformity. Neuro:  Nonfocal  Psych: Normal affect   Labs    High Sensitivity Troponin:  No results for input(s): "TROPONINIHS" in the last 720 hours.   Chemistry Recent Labs  Lab 06/14/23 1740 06/15/23 0958 06/16/23 0314  NA 133* 134* 134*  K 4.2 3.9 3.5  CL 107 108 106  CO2 20* 21* 23  GLUCOSE 143* 150* 159*  BUN 31* 33* 32*  CREATININE 0.82 1.18 1.13  CALCIUM  8.5* 8.3* 8.4*  MG 2.1 2.2 2.2  GFRNONAA >60 >60 >60  ANIONGAP 6 5 5     Lipids No results for input(s): "CHOL", "TRIG", "HDL", "LABVLDL", "LDLCALC", "CHOLHDL" in the last 168 hours.  Hematology Recent Labs  Lab 06/14/23 0654 06/15/23 0958 06/15/23 2201 06/16/23 0314  WBC 6.3 9.8  --  8.1  RBC 2.22* 1.97*  --  2.60*  HGB 8.2* 7.9* 8.7* 9.1*  HCT 25.7* 22.7* 26.8* 28.5*  MCV 115.8* 115.2*  --  109.6*  MCH 36.9* 40.1*  --  35.0*  MCHC 31.9 34.8  --  31.9  RDW 14.4 14.4  --  17.3*  PLT 127* 148*  --  163   Thyroid  No results for input(s): "TSH", "FREET4" in the last 168 hours.  BNP Recent Labs  Lab 06/16/23 0314  BNP 342.4*    DDimer No results for input(s): "DDIMER" in the last 168 hours.   Radiology    ECHOCARDIOGRAM COMPLETE Result Date: 06/15/2023    ECHOCARDIOGRAM REPORT   Patient Name:   Carlos Dawson Date of Exam: 06/15/2023 Medical Rec #:  [id number]        Height:       65.5 in Accession #:    [id number]       Weight:       252.6 lb Date of Birth:  Aug 26, 1940        BSA:          2.197 m Patient Age:    82 years         BP:           114/61 mmHg Patient Gender: M                HR:           91 bpm. Exam Location:  Inpatient Procedure: 2D Echo, Cardiac  Doppler and Color Doppler (Both Spectral and Color            Flow Doppler were utilized during procedure). STAT ECHO Indications:    Pericardial effusion  History:        Patient has prior history of Echocardiogram examinations, most                 recent 10/26/2022. CHF, CAD, Arrythmias:Atrial Fibrillation,                 Signs/Symptoms:Chest Pain; Risk Factors:Dyslipidemia, Sleep                 Apnea and Diabetes. CKD stage 3.  Sonographer:    Juanita Shaw Referring Phys: [id number] SHENG L HALEY IMPRESSIONS  1. Technically difficult; ascending aorta appears to be moderately dilated; suggest CTA or MRA to further assess; no pericardial effusion noted.  2. Left ventricular ejection fraction, by estimation, is 70 to 75%. The left ventricle has hyperdynamic function. The left ventricle has no regional wall motion abnormalities. Left ventricular diastolic parameters are indeterminate.  3. Right ventricular systolic function is normal. The right ventricular size is normal.  4. The mitral valve is normal in structure. No evidence of mitral valve regurgitation. No evidence of mitral stenosis.  5. The aortic valve was not well visualized. Aortic valve regurgitation is not visualized. No aortic stenosis is present.  6. There is mild dilatation of the aortic root, measuring 40 mm. There is moderate dilatation of the ascending aorta, measuring 46 mm. FINDINGS  Left Ventricle: Left ventricular ejection fraction, by estimation, is 70 to 75%. The left ventricle has hyperdynamic function. The left ventricle has no regional wall motion abnormalities. The left ventricular internal cavity size was normal in size. There is no left ventricular hypertrophy. Left ventricular diastolic function could not be evaluated due to atrial fibrillation. Left ventricular diastolic parameters are indeterminate. Right Ventricle: The right ventricular size is normal. Right ventricular systolic function is normal. Left Atrium: Left atrial size was  normal in size. Right Atrium: Right atrial size was normal in size. Pericardium:  There is no evidence of pericardial effusion. Mitral Valve: The mitral valve is normal in structure. No evidence of mitral valve regurgitation. No evidence of mitral valve stenosis. MV peak gradient, 2.5 mmHg. The mean mitral valve gradient is 2.0 mmHg. Tricuspid Valve: The tricuspid valve is normal in structure. Tricuspid valve regurgitation is trivial. No evidence of tricuspid stenosis. Aortic Valve: The aortic valve was not well visualized. Aortic valve regurgitation is not visualized. No aortic stenosis is present. Aortic valve mean gradient measures 7.2 mmHg. Aortic valve peak gradient measures 15.6 mmHg. Aortic valve area, by VTI measures 2.35 cm. Pulmonic Valve: The pulmonic valve was not well visualized. Pulmonic valve regurgitation is trivial. No evidence of pulmonic stenosis. Aorta: The aortic root is normal in size and structure. There is mild dilatation of the aortic root, measuring 40 mm. There is moderate dilatation of the ascending aorta, measuring 46 mm. Venous: The inferior vena cava was not well visualized. IAS/Shunts: The interatrial septum was not well visualized. Additional Comments: Technically difficult; ascending aorta appears to be moderately dilated; suggest CTA or MRA to further assess; no pericardial effusion noted.  LEFT VENTRICLE PLAX 2D LVIDd:         5.30 cm      Diastology LVIDs:         3.60 cm      LV e' medial:    5.77 cm/s LV PW:         1.00 cm      LV E/e' medial:  14.8 LV IVS:        1.00 cm      LV e' lateral:   5.87 cm/s LVOT diam:     2.00 cm      LV E/e' lateral: 14.5 LV SV:         64 LV SV Index:   29 LVOT Area:     3.14 cm  LV Volumes (MOD) LV vol d, MOD A2C: 153.0 ml LV vol d, MOD A4C: 140.0 ml LV vol s, MOD A2C: 34.4 ml LV vol s, MOD A4C: 35.4 ml LV SV MOD A2C:     118.6 ml LV SV MOD A4C:     140.0 ml LV SV MOD BP:      110.3 ml RIGHT VENTRICLE RV Basal diam:  4.30 cm RV Mid diam:     3.20 cm RV S prime:     17.70 cm/s TAPSE (M-mode): 2.4 cm LEFT ATRIUM             Index        RIGHT ATRIUM           Index LA diam:        3.20 cm 1.46 cm/m   RA Area:     16.20 cm LA Vol (A2C):   45.3 ml 20.62 ml/m  RA Volume:   34.90 ml  15.88 ml/m LA Vol (A4C):   45.2 ml 20.57 ml/m LA Biplane Vol: 45.7 ml 20.80 ml/m  AORTIC VALVE                     PULMONIC VALVE AV Area (Vmax):    2.53 cm      PV Vmax:          1.74 m/s AV Area (Vmean):   2.29 cm      PV Peak grad:     12.2 mmHg AV Area (VTI):     2.35 cm      PR End Diast  Vel: 5.02 msec AV Vmax:           197.20 cm/s AV Vmean:          115.020 cm/s AV VTI:            0.272 m AV Peak Grad:      15.6 mmHg AV Mean Grad:      7.2 mmHg LVOT Vmax:         159.00 cm/s LVOT Vmean:        83.800 cm/s LVOT VTI:          0.203 m LVOT/AV VTI ratio: 0.75  AORTA Ao Root diam: 4.00 cm Ao Asc diam:  4.57 cm MITRAL VALVE               TRICUSPID VALVE MV Area (PHT): 2.62 cm    TR Peak grad:   35.5 mmHg MV Area VTI:   2.46 cm    TR Vmax:        298.00 cm/s MV Peak grad:  2.5 mmHg MV Mean grad:  2.0 mmHg    SHUNTS MV Vmax:       0.80 m/s    Systemic VTI:  0.20 m MV Vmean:      65.8 cm/s   Systemic Diam: 2.00 cm MV Decel Time: 290 msec MV E velocity: 85.40 cm/s MV A velocity: 74.90 cm/s MV E/A ratio:  1.14 Carlos Angel MD Electronically signed by Carlos Angel MD Signature Date/Time: 06/15/2023/10:12:25 AM    Final    DG CHEST PORT 1 VIEW Result Date: 06/14/2023 CLINICAL DATA:  Shortness of breath. EXAM: PORTABLE CHEST 1 VIEW COMPARISON:  Radiograph 06/15/2022 FINDINGS: Cardiomegaly is stable. Unchanged mediastinal contours. Streaky atelectasis in both lung bases. No confluent airspace disease. No pulmonary edema, large pleural effusion or pneumothorax. Thoracolumbar spinal fusion hardware is partially included in the field of view. IMPRESSION: 1. Streaky atelectasis in both lung bases. 2. Stable cardiomegaly. Electronically Signed   By: Chadwick Colonel M.D.    On: 06/14/2023 18:16    Cardiac Studies     Patient Profile     83 y.o. male  history of atrial fibrillation status post ablation in 2019 and Watchman in 05/2022 (due to hematuria), CAD status post PCI in 2012, chronic diastolic heart failure, PE, OSA, T2DM, thoracic aortic aneurysm who we are consulted for evaluation of possible atrial fibrillation   Assessment & Plan    Paroxysmal atrial fibrillation: Status post ablation 04/2017 and Watchman 05/2022.   - Cardiology was consulted due to concern for A-fib with RVR.  From review of telemetry, appears sinus with frequent PACs.  He had been started on IV amiodarone, was discontinued.  Continue Lopressor  25 mg twice daily.  Maintain K greater than 4, mag greater than 2. - He remains in sinus rhythm with frequent PACs, though heart rate significantly improved.  Suspect anemia was contributing, has improved since PRBC transfusion.    Acute on chronic diastolic heart failure: Echocardiogram 5/23 shows EF 70 to 75%, normal RV function.  He appears volume overloaded on exam - Continue IV Lasix , will plan 40 mg twice daily today - Strict I's/O's and daily weights  CAD: Status post BMS to OM2 in 2012.  Cardiac catheterization in 2019 showed patent stents.  He denies any chest pain.  Notably, he had a stress PET on 10/24 which was high risk study due to marked reduction in myocardial blood flow reserve (though question accuracy in setting of prior PCI), can follow-up as  outpatient and consider further ischemic evaluation  Anemia: Following hip surgery, hemoglobin dropped from 11.2 on 5/6 to 7.9 on 5/23.  Will transfuse for goal hemoglobin greater than 8 given cardiac history.  Received 1 unit PRBCs on 5/23, hemoglobin 9.1 today  For questions or updates, please contact [facility] Please consult [email or web address] for contact info under        Signed, Wendie Hamburg, MD  06/16/2023, 9:23 AM

## 2023-06-17 DIAGNOSIS — I5033 Acute on chronic diastolic (congestive) heart failure: Secondary | ICD-10-CM | POA: Diagnosis not present

## 2023-06-17 DIAGNOSIS — I2510 Atherosclerotic heart disease of native coronary artery without angina pectoris: Secondary | ICD-10-CM | POA: Diagnosis not present

## 2023-06-17 DIAGNOSIS — I4729 Other ventricular tachycardia: Secondary | ICD-10-CM | POA: Diagnosis not present

## 2023-06-17 DIAGNOSIS — R000 Tachycardia, unspecified: Secondary | ICD-10-CM | POA: Diagnosis not present

## 2023-06-17 DIAGNOSIS — I480 Paroxysmal atrial fibrillation: Secondary | ICD-10-CM | POA: Diagnosis not present

## 2023-06-17 LAB — BASIC METABOLIC PANEL WITH GFR
Anion gap: 7 (ref 5–15)
BUN: 30 mg/dL — ABNORMAL HIGH (ref 8–23)
CO2: 21 mmol/L — ABNORMAL LOW (ref 22–32)
Calcium: 8.3 mg/dL — ABNORMAL LOW (ref 8.9–10.3)
Chloride: 107 mmol/L (ref 98–111)
Creatinine, Ser: 1.03 mg/dL (ref 0.61–1.24)
GFR, Estimated: >60 mL/min (ref >60)
Glucose, Bld: 130 mg/dL — ABNORMAL HIGH (ref 70–99)
Potassium: 3.7 mmol/L (ref 3.5–5.1)
Sodium: 135 mmol/L (ref 135–145)

## 2023-06-17 LAB — CBC
HCT: 26.9 % — ABNORMAL LOW (ref 39.0–52.0)
Hemoglobin: 9.4 g/dL — ABNORMAL LOW (ref 13.0–17.0)
MCH: 40.2 pg — ABNORMAL HIGH (ref 26.0–34.0)
MCHC: 34.9 g/dL (ref 30.0–36.0)
MCV: 115 fL — ABNORMAL HIGH (ref 80.0–100.0)
Platelets: 158 10*3/uL (ref 150–400)
RBC: 2.34 MIL/uL — ABNORMAL LOW (ref 4.22–5.81)
RDW: 18.1 % — ABNORMAL HIGH (ref 11.5–15.5)
WBC: 7.3 10*3/uL (ref 4.0–10.5)
nRBC: 1.4 % — ABNORMAL HIGH (ref 0.0–0.2)

## 2023-06-17 LAB — MAGNESIUM: Magnesium: 2.3 mg/dL (ref 1.7–2.4)

## 2023-06-17 LAB — GLUCOSE, CAPILLARY
Glucose-Capillary: 119 mg/dL — ABNORMAL HIGH (ref 70–99)
Glucose-Capillary: 193 mg/dL — ABNORMAL HIGH (ref 70–99)
Glucose-Capillary: 143 mg/dL — ABNORMAL HIGH (ref 70–99)
Glucose-Capillary: 152 mg/dL — ABNORMAL HIGH (ref 70–99)

## 2023-06-17 MED ORDER — ASPIRIN 81 MG PO TBEC
81.0000 mg | DELAYED_RELEASE_TABLET | Freq: Every day | ORAL | Status: DC
  Administered 2023-06-17: 81 mg via ORAL
  Filled 2023-06-17 (×2): qty 1

## 2023-06-17 MED ORDER — ALUM & MAG HYDROXIDE-SIMETH 200-200-20 MG/5ML PO SUSP: 30.0000 mL | ORAL | Status: DC | PRN

## 2023-06-17 NOTE — Progress Notes (Signed)
 Rounding Note    Patient Name: Carlos Dawson Date of Encounter: 06/17/2023  [facility] Cardiologist: Peter [name], MD   Subjective   BP 126/58.  Creatinine stable at 1.0.  Hgb 9.4. Net 1.8L yesterday.  Reports continues to have shortness of breath  Inpatient Medications    Scheduled Meds:  sodium chloride    Intravenous Once   Chlorhexidine  Gluconate Cloth  6 each Topical Daily   furosemide   40 mg Intravenous BID   insulin  aspart  0-15 Units Subcutaneous TID WC   loratadine   10 mg Oral Daily   metoprolol  tartrate  25 mg Oral BID   pantoprazole   40 mg Oral Daily   potassium chloride   20 mEq Oral QPM   potassium chloride  SA  40 mEq Oral Daily   rivaroxaban   10 mg Oral Q breakfast   sennosides  10 mL Oral BID   tamsulosin   0.4 mg Oral QPM   topiramate   50 mg Oral BID   tranexamic acid  (CYKLOKAPRON ) 2,000 mg in sodium chloride  0.9 % 50 mL Topical Application  2,000 mg Topical Once   Continuous Infusions:  PRN Meds: acetaminophen , albuterol , budesonide -formoterol , cyclobenzaprine , HYDROcodone -acetaminophen , HYDROcodone -acetaminophen , HYDROmorphone  (DILAUDID ) injection, magnesium  citrate, menthol -cetylpyridinium **OR** phenol, metoCLOPramide  **OR** metoCLOPramide  (REGLAN ) injection, nitroGLYCERIN , ondansetron  **OR** ondansetron  (ZOFRAN ) IV, mouth rinse, polyethylene glycol, simethicone , tiotropium   Vital Signs    Vitals:   06/17/23 0600 06/17/23 0624 06/17/23 0700 06/17/23 0800  BP: (!) 107/46  (!) 126/58   Pulse: 96  97   Resp: (!) 22  18   Temp:  98.3 F (36.8 C)  97.7 F (36.5 C)  TempSrc:  Oral  Oral  SpO2: 97%  100%   Weight:      Height:        Intake/Output Summary (Last 24 hours) at 06/17/2023 0832 Last data filed at 06/16/2023 2200 Gross per 24 hour  Intake --  Output 2300 ml  Net -2300 ml      06/17/2023    1:45 AM 06/16/2023    5:00 AM 06/14/2023    6:29 PM  Last 3 Weights  Weight (lbs) 255 lb 11.7 oz 256 lb 6.3 oz 252 lb 10.4 oz   Weight (kg) 116 kg 116.3 kg 114.6 kg      Telemetry    Sinus with frequent PACs, rate 80s- Personally Reviewed  ECG    No new ECG- Personally Reviewed  Physical Exam   GEN: No acute distress.   Neck: + JVD Cardiac: Irregular, normal rate no murmurs, rubs, or gallops.  Respiratory: Breath sounds GI: Distended MS: 1+ edema; No deformity. Neuro:  Nonfocal  Psych: Normal affect   Labs    High Sensitivity Troponin:  No results for input(s): "TROPONINIHS" in the last 720 hours.   Chemistry Recent Labs  Lab 06/15/23 0958 06/16/23 0314 06/17/23 0304  NA 134* 134* 135  K 3.9 3.5 3.7  CL 108 106 107  CO2 21* 23 21*  GLUCOSE 150* 159* 130*  BUN 33* 32* 30*  CREATININE 1.18 1.13 1.03  CALCIUM  8.3* 8.4* 8.3*  MG 2.2 2.2 2.3  GFRNONAA >60 >60 >60  ANIONGAP 5 5 7     Lipids No results for input(s): "CHOL", "TRIG", "HDL", "LABVLDL", "LDLCALC", "CHOLHDL" in the last 168 hours.  Hematology Recent Labs  Lab 06/15/23 0958 06/15/23 2201 06/16/23 0314 06/17/23 0304  WBC 9.8  --  8.1 7.3  RBC 1.97*  --  2.60* 2.34*  HGB 7.9* 8.7* 9.1* 9.4*  HCT  22.7* 26.8* 28.5* 26.9*  MCV 115.2*  --  109.6* 115.0*  MCH 40.1*  --  35.0* 40.2*  MCHC 34.8  --  31.9 34.9  RDW 14.4  --  17.3* 18.1*  PLT 148*  --  163 158   Thyroid  No results for input(s): "TSH", "FREET4" in the last 168 hours.  BNP Recent Labs  Lab 06/16/23 0314  BNP 342.4*    DDimer No results for input(s): "DDIMER" in the last 168 hours.   Radiology    DG Abd 1 View Result Date: 06/16/2023 CLINICAL DATA:  Constipation, lower abdominal pain, distended abdomen EXAM: ABDOMEN - 1 VIEW COMPARISON:  06/11/2023 FINDINGS: Two supine frontal views of the abdomen and pelvis are obtained. There is marked gaseous distention of the colon consistent with ileus. No evidence of small-bowel obstruction. No masses or abnormal calcifications. Postsurgical changes are seen from thoracolumbar spinal fusion and bilateral hip  arthroplasties. IMPRESSION: 1. Diffuse gaseous distention of the colon most consistent with ileus. No evidence of small-bowel obstruction. Electronically Signed   By: Bobbye Burrow M.D.   On: 06/16/2023 22:37   ECHOCARDIOGRAM COMPLETE Result Date: 06/15/2023    ECHOCARDIOGRAM REPORT   Patient Name:   Carlos Dawson Date of Exam: 06/15/2023 Medical Rec #:  [id number]        Height:       65.5 in Accession #:    [id number]       Weight:       252.6 lb Date of Birth:  07/05/1940        BSA:          2.197 m Patient Age:    82 years         BP:           114/61 mmHg Patient Gender: M                HR:           91 bpm. Exam Location:  Inpatient Procedure: 2D Echo, Cardiac Doppler and Color Doppler (Both Spectral and Color            Flow Doppler were utilized during procedure). STAT ECHO Indications:    Pericardial effusion  History:        Patient has prior history of Echocardiogram examinations, most                 recent 10/26/2022. CHF, CAD, Arrythmias:Atrial Fibrillation,                 Signs/Symptoms:Chest Pain; Risk Factors:Dyslipidemia, Sleep                 Apnea and Diabetes. CKD stage 3.  Sonographer:    Juanita Shaw Referring Phys: [id number] SHENG L HALEY IMPRESSIONS  1. Technically difficult; ascending aorta appears to be moderately dilated; suggest CTA or MRA to further assess; no pericardial effusion noted.  2. Left ventricular ejection fraction, by estimation, is 70 to 75%. The left ventricle has hyperdynamic function. The left ventricle has no regional wall motion abnormalities. Left ventricular diastolic parameters are indeterminate.  3. Right ventricular systolic function is normal. The right ventricular size is normal.  4. The mitral valve is normal in structure. No evidence of mitral valve regurgitation. No evidence of mitral stenosis.  5. The aortic valve was not well visualized. Aortic valve regurgitation is not visualized. No aortic stenosis is present.  6. There is mild dilatation of the  aortic root, measuring 40 mm.  There is moderate dilatation of the ascending aorta, measuring 46 mm. FINDINGS  Left Ventricle: Left ventricular ejection fraction, by estimation, is 70 to 75%. The left ventricle has hyperdynamic function. The left ventricle has no regional wall motion abnormalities. The left ventricular internal cavity size was normal in size. There is no left ventricular hypertrophy. Left ventricular diastolic function could not be evaluated due to atrial fibrillation. Left ventricular diastolic parameters are indeterminate. Right Ventricle: The right ventricular size is normal. Right ventricular systolic function is normal. Left Atrium: Left atrial size was normal in size. Right Atrium: Right atrial size was normal in size. Pericardium: There is no evidence of pericardial effusion. Mitral Valve: The mitral valve is normal in structure. No evidence of mitral valve regurgitation. No evidence of mitral valve stenosis. MV peak gradient, 2.5 mmHg. The mean mitral valve gradient is 2.0 mmHg. Tricuspid Valve: The tricuspid valve is normal in structure. Tricuspid valve regurgitation is trivial. No evidence of tricuspid stenosis. Aortic Valve: The aortic valve was not well visualized. Aortic valve regurgitation is not visualized. No aortic stenosis is present. Aortic valve mean gradient measures 7.2 mmHg. Aortic valve peak gradient measures 15.6 mmHg. Aortic valve area, by VTI measures 2.35 cm. Pulmonic Valve: The pulmonic valve was not well visualized. Pulmonic valve regurgitation is trivial. No evidence of pulmonic stenosis. Aorta: The aortic root is normal in size and structure. There is mild dilatation of the aortic root, measuring 40 mm. There is moderate dilatation of the ascending aorta, measuring 46 mm. Venous: The inferior vena cava was not well visualized. IAS/Shunts: The interatrial septum was not well visualized. Additional Comments: Technically difficult; ascending aorta appears to be  moderately dilated; suggest CTA or MRA to further assess; no pericardial effusion noted.  LEFT VENTRICLE PLAX 2D LVIDd:         5.30 cm      Diastology LVIDs:         3.60 cm      LV e' medial:    5.77 cm/s LV PW:         1.00 cm      LV E/e' medial:  14.8 LV IVS:        1.00 cm      LV e' lateral:   5.87 cm/s LVOT diam:     2.00 cm      LV E/e' lateral: 14.5 LV SV:         64 LV SV Index:   29 LVOT Area:     3.14 cm  LV Volumes (MOD) LV vol d, MOD A2C: 153.0 ml LV vol d, MOD A4C: 140.0 ml LV vol s, MOD A2C: 34.4 ml LV vol s, MOD A4C: 35.4 ml LV SV MOD A2C:     118.6 ml LV SV MOD A4C:     140.0 ml LV SV MOD BP:      110.3 ml RIGHT VENTRICLE RV Basal diam:  4.30 cm RV Mid diam:    3.20 cm RV S prime:     17.70 cm/s TAPSE (M-mode): 2.4 cm LEFT ATRIUM             Index        RIGHT ATRIUM           Index LA diam:        3.20 cm 1.46 cm/m   RA Area:     16.20 cm LA Vol (A2C):   45.3 ml 20.62 ml/m  RA Volume:   34.90 ml  15.88 ml/m LA Vol (A4C):   45.2 ml 20.57 ml/m LA Biplane Vol: 45.7 ml 20.80 ml/m  AORTIC VALVE                     PULMONIC VALVE AV Area (Vmax):    2.53 cm      PV Vmax:          1.74 m/s AV Area (Vmean):   2.29 cm      PV Peak grad:     12.2 mmHg AV Area (VTI):     2.35 cm      PR End Diast Vel: 5.02 msec AV Vmax:           197.20 cm/s AV Vmean:          115.020 cm/s AV VTI:            0.272 m AV Peak Grad:      15.6 mmHg AV Mean Grad:      7.2 mmHg LVOT Vmax:         159.00 cm/s LVOT Vmean:        83.800 cm/s LVOT VTI:          0.203 m LVOT/AV VTI ratio: 0.75  AORTA Ao Root diam: 4.00 cm Ao Asc diam:  4.57 cm MITRAL VALVE               TRICUSPID VALVE MV Area (PHT): 2.62 cm    TR Peak grad:   35.5 mmHg MV Area VTI:   2.46 cm    TR Vmax:        298.00 cm/s MV Peak grad:  2.5 mmHg MV Mean grad:  2.0 mmHg    SHUNTS MV Vmax:       0.80 m/s    Systemic VTI:  0.20 m MV Vmean:      65.8 cm/s   Systemic Diam: 2.00 cm MV Decel Time: 290 msec MV E velocity: 85.40 cm/s MV A velocity: 74.90 cm/s MV  E/A ratio:  1.14 Alexandria Angel MD Electronically signed by Alexandria Angel MD Signature Date/Time: 06/15/2023/10:12:25 AM    Final     Cardiac Studies     Patient Profile     83 y.o. male  history of atrial fibrillation status post ablation in 2019 and Watchman in 05/2022 (due to hematuria), CAD status post PCI in 2012, chronic diastolic heart failure, PE, OSA, T2DM, thoracic aortic aneurysm who we are consulted for evaluation of possible atrial fibrillation   Assessment & Plan    Paroxysmal atrial fibrillation: Status post ablation 04/2017 and Watchman 05/2022.   - Cardiology was consulted due to concern for A-fib with RVR.  From review of telemetry, appears sinus with frequent PACs.  He had been started on IV amiodarone, was discontinued.  Continue Lopressor  25 mg twice daily.  Maintain K greater than 4, mag greater than 2. - He remains in sinus rhythm with frequent PACs, though heart rate improved.  Suspect anemia was contributing to his tachycardia, has improved since PRBC transfusion.    Acute on chronic diastolic heart failure: Echocardiogram 5/23 shows EF 70 to 75%, normal RV function.  He appears volume overloaded on exam - Continue IV Lasix , diuresing well but remains volume overloaded on exam - Strict I's/O's and daily weights  CAD: Status post BMS to OM2 in 2012.  Cardiac catheterization in 2019 showed patent stents.  He denies any chest pain.  Notably, he had a stress PET on 10/24 which was  high risk study due to marked reduction in myocardial blood flow reserve (though question accuracy in setting of prior PCI), can follow-up as outpatient and consider further ischemic evaluation -Restart ASA 81 mg daily given history of coronary stenting  Anemia: Following hip surgery, hemoglobin dropped from 11.2 on 5/6 to 7.9 on 5/23.  Would transfuse for goal hemoglobin greater than 8 given cardiac history.  Received 1 unit PRBCs on 5/23, hemoglobin 9.4 today  For questions or updates, please  contact [facility] Please consult [email or web address] for contact info under        Signed, Wendie Hamburg, MD  06/17/2023, 8:32 AM

## 2023-06-17 NOTE — Progress Notes (Signed)
 PROGRESS NOTE    Patient: Carlos Dawson                            PCP: Tena Feeling, MD                    DOB: 1940/02/13            DOA: 06/11/2023 XBM:[id number]             DOS: 06/17/2023, 12:37 PM   LOS: 3 days   Date of Service: The patient was seen and examined on 06/17/2023  Subjective:   The patient was seen examined this morning, stable comfortable laying in bed, Still complaining of distended abdomen, Blood pressure and heart rate has improved. But satting 87% on room air  Distended abdomen, KUB consistent with ileus discussed with patient-to be on minimum clear diet for now if nausea vomiting to pursue with NG tube placement   Brief Narrative:   Carlos Dawson is a 83 y.o. male with medical history significant for CAD, diastolic CHF, COPD on room air, history of DVT, paroxysmal atrial fibrillation status post Watchman procedure, no longer on anticoagulation who was admitted to the hospital by the orthopedic service on 5/19 after successful left hip total arthroplasty who is now being seen in consultation due to concern for hypotension, tachycardia and dyspnea with exertion.  .     Assessment & Plan:   Principal Problem:   NSVT (nonsustained ventricular tachycardia) (HCC) Active Problems:   OA (osteoarthritis) of hip   Hypotension   Status post hip replacement   Primary osteoarthritis of left hip   Paroxysmal atrial fibrillation (HCC)   SOB (shortness of breath)   Acute blood loss anemia    Tachycardia, with history of A-fib - Post hip arthroplasty patient was noted to be hypotensive, tachycardic thought to be in A-fib with RVR  -History of paroxysmal A-fib s/p ablation May 11, 2017, Watchman device May 2024 - 06/15/2023 -started on amiodarone drip overnight, IV fluids - Cardiology consulted we discontinued amiodarone drip, continue metoprolol  PO - Electrolytes potassium magnesium  replaced - Heart rate blood pressure has improved  Acute hypoxic  respiratory failure - Satting 87% on room air, continue O2 supplements, maintaining O2 sat greater than 92% - Shortness of breath, hypoxia exacerbated by volume overload, ileus, abdominal distention - Bronchodilators as needed   History of paroxysmal atrial fibrillation, -Heart rate improved -  s/p ablation April 2019, Watchman device 2024 - 06/15/2023 discontinue IV amiodarone, heparin  - Continue Lopressor  25 mg p.o. twice daily for rate control per cardiology - Appreciate cardiology following closely  Hypotensive -postop (h/o HTN) -Holding antihypertensive medications, stopping Jardiance  due to hypotension -Improved, continue metoprolol  25 mg twice daily - 06/15/23 s/p 1U PRBC blood transfusion  Total hip arthroplasty Postop day # 6 -Remains stable-pending PT evaluation today - Orthopedic team following, - Pain management per Ortho  Ileus - KUB consistent with ileus  - Likely postop ileus due to reduced mobility  - Continuing bowel regimen, replating electrolytes, - Encourage ambulation - On minimum clear liquid diet, with low threshold to place the patient on n.p.o. and NG tube placement  - Monitoring closely  history of CAD, -Denies any chest pain - With history of cardiac cath, stenting in 2012, - Continue home medication of aspirin , intolerant to statins  HFpEF - ?  Pulmonary hypertension - Last echo 11/12/22 ejection fraction 60-65%, 1 diastolic dysfunction -  Cardiology mildly volume overloaded, will need to be diuresed, with blood pressures remain soft - Cardiology continuing IV Lasix  - Will monitor I's and O's  Intake/Output Summary (Last 24 hours) at 06/17/2023 1237 Last data filed at 06/17/2023 1200 Gross per 24 hour  Intake 360 ml  Output 1950 ml  Net -1590 ml    Acute on chronic anemia -exacerbated postop and with IVF - Cardiology recommendation hemoglobin needs to be >8 Which also may have been contributed to hypotension, and tachycardia    Latest Ref  Rng & Units 06/17/2023    3:04 AM 06/16/2023    3:14 AM 06/15/2023   10:01 PM  CBC  WBC 4.0 - 10.5 K/uL 7.3  8.1    Hemoglobin 13.0 - 17.0 g/dL 9.4  9.1  8.7   Hematocrit 39.0 - 52.0 % 26.9  28.5  26.8   Platelets 150 - 400 K/uL 158  163    - Hemoglobin improved along with the tachycardia and hypertension 06/15/23 s/p 1 unit PRBC blood transfusion  History of thoracic aortic aneurysm - 4.9 cm on last CT, continue follows outpatient  History of PE - Currently on Xarelto        ----------------------------------------------------------------------------------------------------------------------------------------------- Nutritional status:  The patient's BMI is: Body mass index is 41.91 kg/m. I agree with the assessment and plan as outlined  Skin Assessment: I have examined the patient's skin and I agree with the wound assessment as performed by wound care team As outlined belowe: Pressure Injury 06/14/23 Buttocks Mid Deep Tissue Pressure Injury - Purple or maroon localized area of discolored intact skin or blood-filled blister due to damage of underlying soft tissue from pressure and/or shear. Area of non-blanching purple skin on (Active)  06/14/23 1830  Location: Buttocks  Location Orientation: Mid  Staging: Deep Tissue Pressure Injury - Purple or maroon localized area of discolored intact skin or blood-filled blister due to damage of underlying soft tissue from pressure and/or shear.  Wound Description (Comments): Area of non-blanching purple skin on buttocks  Present on Admission: Yes  Dressing Type Foam - Lift dressing to assess site every shift 06/17/23 0800    ---------------------------------------------------------------------------------------------------------------------  DVT prophylaxis:  rivaroxaban  (XARELTO ) tablet 10 mg Start: 06/12/23 0800 Place TED hose Start: 06/11/23 1651 SCDs Start: 06/11/23 1433 rivaroxaban  (XARELTO ) tablet 10 mg   Code Status:   Code  Status: Full Code  Family Communication: No family member present at bedside-  -Advance care planning has been discussed.   Admission status:   Status is: Inpatient Remains inpatient appropriate because: Needing close monitoring in critical care, with A-fib, anemia, needing diuretics, blood transfusion   Disposition: From  - home             Planning for discharge in 2 -3  days: to Home    Procedures:   PR ARTHRP ACETBLR/PROX FEM PROSTC AGRFT/ALGRFT [27130]   Antimicrobials:  Anti-infectives (From admission, onward)    Start     Dose/Rate Route Frequency Ordered Stop   06/11/23 1830  ceFAZolin  (ANCEF ) IVPB 2g/100 mL premix        2 g 200 mL/hr over 30 Minutes Intravenous Every 6 hours 06/11/23 1651 06/12/23 0747   06/11/23 1030  ceFAZolin  (ANCEF ) IVPB 2g/100 mL premix        2 g 200 mL/hr over 30 Minutes Intravenous On call to O.R. 06/11/23 1025 06/11/23 1300        Medication:   sodium chloride    Intravenous Once   aspirin  EC  81  mg Oral Daily   Chlorhexidine  Gluconate Cloth  6 each Topical Daily   furosemide   40 mg Intravenous BID   insulin  aspart  0-15 Units Subcutaneous TID WC   loratadine   10 mg Oral Daily   metoprolol  tartrate  25 mg Oral BID   pantoprazole   40 mg Oral Daily   potassium chloride   20 mEq Oral QPM   potassium chloride  SA  40 mEq Oral Daily   rivaroxaban   10 mg Oral Q breakfast   sennosides  10 mL Oral BID   tamsulosin   0.4 mg Oral QPM   topiramate   50 mg Oral BID   tranexamic acid  (CYKLOKAPRON ) 2,000 mg in sodium chloride  0.9 % 50 mL Topical Application  2,000 mg Topical Once    acetaminophen , albuterol , budesonide -formoterol , cyclobenzaprine , HYDROcodone -acetaminophen , HYDROcodone -acetaminophen , HYDROmorphone  (DILAUDID ) injection, magnesium  citrate, menthol -cetylpyridinium **OR** phenol, metoCLOPramide  **OR** metoCLOPramide  (REGLAN ) injection, nitroGLYCERIN , ondansetron  **OR** ondansetron  (ZOFRAN ) IV, mouth rinse, polyethylene glycol,  simethicone , tiotropium   Objective:   Vitals:   06/17/23 0900 06/17/23 1000 06/17/23 1036 06/17/23 1100  BP: (!) 132/92 (!) 142/77 (!) 142/77 (!) 117/58  Pulse: (!) 107 (!) 106 (!) 109 (!) 103  Resp: 19 18  15   Temp:      TempSrc:      SpO2: 90% 92%  (!) 87%  Weight:      Height:        Intake/Output Summary (Last 24 hours) at 06/17/2023 1237 Last data filed at 06/17/2023 1200 Gross per 24 hour  Intake 360 ml  Output 1950 ml  Net -1590 ml   Filed Weights   06/14/23 1829 06/16/23 0500 06/17/23 0145  Weight: 114.6 kg 116.3 kg 116 kg     Physical examination:    General:  AAO x 3,  cooperative, no distress;   HEENT:  Normocephalic, PERRL, otherwise with in Normal limits   Neuro:  CNII-XII intact. , normal motor and sensation, reflexes intact   Lungs:   Clear to auscultation BL, Respirations unlabored,  No wheezes / crackles  Cardio:    S1/S2, RRR, No murmure, No Rubs or Gallops   Abdomen:  Distended, hypoactive bowel sounds nontender.  Muscular  skeletal:  Limited exam -global generalized weaknesses - in bed, able to move all 4 extremities,   2+ pulses,  symmetric, No pitting edema  Skin:  Dry, warm to touch, negative for any Rashes,  Wounds: Please see nursing documentation  Pressure Injury 06/14/23 Buttocks Mid Deep Tissue Pressure Injury - Purple or maroon localized area of discolored intact skin or blood-filled blister due to damage of underlying soft tissue from pressure and/or shear. Area of non-blanching purple skin on (Active)  06/14/23 1830  Location: Buttocks  Location Orientation: Mid  Staging: Deep Tissue Pressure Injury - Purple or maroon localized area of discolored intact skin or blood-filled blister due to damage of underlying soft tissue from pressure and/or shear.  Wound Description (Comments): Area of non-blanching purple skin on buttocks  Present on Admission: Yes  Dressing Type Foam - Lift dressing to assess site every shift 06/17/23 0800       ----------------------------------------------------------------------------------------------------------------------    LABs:     Latest Ref Rng & Units 06/17/2023    3:04 AM 06/16/2023    3:14 AM 06/15/2023   10:01 PM  CBC  WBC 4.0 - 10.5 K/uL 7.3  8.1    Hemoglobin 13.0 - 17.0 g/dL 9.4  9.1  8.7   Hematocrit 39.0 - 52.0 % 26.9  28.5  26.8  Platelets 150 - 400 K/uL 158  163        Latest Ref Rng & Units 06/17/2023    3:04 AM 06/16/2023    3:14 AM 06/15/2023    9:58 AM  CMP  Glucose 70 - 99 mg/dL 188  416  606   BUN 8 - 23 mg/dL 30  32  33   Creatinine 0.61 - 1.24 mg/dL 3.01  6.01  0.93   Sodium 135 - 145 mmol/L 135  134  134   Potassium 3.5 - 5.1 mmol/L 3.7  3.5  3.9   Chloride 98 - 111 mmol/L 107  106  108   CO2 22 - 32 mmol/L 21  23  21    Calcium  8.9 - 10.3 mg/dL 8.3  8.4  8.3        Micro Results Recent Results (from the past 240 hours)  MRSA Next Gen by PCR, Nasal     Status: None   Collection Time: 06/14/23  6:32 PM   Specimen: Nasal Mucosa; Nasal Swab  Result Value Ref Range Status   MRSA by PCR Next Gen NOT DETECTED NOT DETECTED Final    Comment: (NOTE) The GeneXpert MRSA Assay (FDA approved for NASAL specimens only), is one component of a comprehensive MRSA colonization surveillance program. It is not intended to diagnose MRSA infection nor to guide or monitor treatment for MRSA infections. Test performance is not FDA approved in patients less than 37 years old. Performed at [facility], [address], [place], [place] [address]     Radiology Reports DG Abd 1 View Result Date: 06/16/2023 CLINICAL DATA:  Constipation, lower abdominal pain, distended abdomen EXAM: ABDOMEN - 1 VIEW COMPARISON:  06/11/2023 FINDINGS: Two supine frontal views of the abdomen and pelvis are obtained. There is marked gaseous distention of the colon consistent with ileus. No evidence of small-bowel obstruction. No masses or abnormal calcifications.  Postsurgical changes are seen from thoracolumbar spinal fusion and bilateral hip arthroplasties. IMPRESSION: 1. Diffuse gaseous distention of the colon most consistent with ileus. No evidence of small-bowel obstruction. Electronically Signed   By: Bobbye Burrow M.D.   On: 06/16/2023 22:37     SIGNED: Bobbetta Burnet, MD, FHM. FAAFP. Arlin Benes - [facility] Critical care time spent - 55 min.  In seeing, evaluating and examining the patient. Reviewing medical records, labs, drawn plan of care. [facility],  Pager (please use [email or web address] to page/ text) Please use Epic Secure Chat for non-urgent communication (7AM-7PM)  If 7PM-7AM, please contact night-coverage [email or web address], 06/17/2023, 12:37 PM

## 2023-06-17 NOTE — Progress Notes (Signed)
 Patient ID: Carlos Dawson, male   DOB: Jun 06, 1940, 83 y.o.   MRN: [id number] Subjective: 6 Days Post-Op Procedure(s) (LRB): ARTHROPLASTY, HIP, TOTAL, ANTERIOR APPROACH (Left)    Patient reports pain as moderate. Postop course currently complicated by cardiac concerns of A-fib versus PVCs.  Family is in the room with him this morning.  They report that yesterday he was fairly lethargic perhaps related to pain medication use.  Bed mobility a little bit of a challenge due to no overhead trapeze. He has been having some issues with his right hip giving way and had questions regarding this.  Postoperative radiographs from his left hip did not indicate periprosthetic complications involving the right hip. Family notes 8 pound weight gain and apparently this has been known to the intensive care unit team as they have tried diuretics to improve this.  Objective:   VITALS:   Vitals:   06/17/23 0624 06/17/23 0700  BP:  (!) 126/58  Pulse:  97  Resp:  18  Temp: 98.3 F (36.8 C)   SpO2:  100%    Neurovascular intact Incision: dressing C/D/I -left hip dressing dry He demonstrates fairly good mobility of his left lower extremity with abduction and adduction as well as hip flexion and extension. Mild lower extremity edema  LABS Recent Labs    06/15/23 0958 06/15/23 2201 06/16/23 0314 06/17/23 0304  HGB 7.9* 8.7* 9.1* 9.4*  HCT 22.7* 26.8* 28.5* 26.9*  WBC 9.8  --  8.1 7.3  PLT 148*  --  163 158    Recent Labs    06/15/23 0958 06/16/23 0314 06/17/23 0304  NA 134* 134* 135  K 3.9 3.5 3.7  BUN 33* 32* 30*  CREATININE 1.18 1.13 1.03  GLUCOSE 150* 159* 130*    No results for input(s): "LABPT", "INR" in the last 72 hours.   Assessment/Plan: 6 Days Post-Op Procedure(s) (LRB): ARTHROPLASTY, HIP, TOTAL, ANTERIOR APPROACH (Left)  Plan: Today we reviewed the condition of his left hip.  From a left hip standpoint I think he is doing just fine.  His biggest issue is his  postoperative course complicated by atrial fibrillation.  He has been taken off amiodarone and started on Lopressor .  The hope is that his lethargy will be improved today perhaps minimizing IV narcotics and that he would be able to work with physical therapy little more.  He will continue to be treated and monitored for cardiac related concerns. Today it is noted that his hemoglobin is stable at 9.4 after receiving a single unit of packed red blood cells per cardiology.  His creatinine is stable without signs of acute renal injury.  Disposition will be dependent on his mobilization and gaining strength.

## 2023-06-17 NOTE — Progress Notes (Signed)
 Physical Therapy Treatment Patient Details Name: Carlos Dawson MRN: [id number] DOB: January 10, 1941 Today's Date: 06/17/2023   History of Present Illness patient is a 83 y.o. male who was admitted to the hospital by the orthopedic service on 5/19 after successful left hip total arthroplasty who is now being seen in consultation due to concern for hypotension, tachycardia and dyspnea with exertion.   Transfer to SDU 5/22 due to A Fib. PMH:CAD, diastolic CHF, COPD on room air, history of DVT, paroxysmal atrial fibrillation status post Watchman procedure, no longer on anticoagulation    PT Comments  Pt eager to get OOB.  Pt currently requiring mod-max (+2 for safety/lines) assist with transfer to recliner.  Elevated HR and DOE limiting activity at this time.     If plan is discharge home, recommend the following: Assistance with cooking/housework;Help with stairs or ramp for entrance;Assist for transportation;A lot of help with walking and/or transfers;A lot of help with bathing/dressing/bathroom   Can travel by private vehicle        Equipment Recommendations  None recommended by PT    Recommendations for Other Services       Precautions / Restrictions Precautions Precautions: Fall Precaution/Restrictions Comments: Reports R (non-surgical) hip buckles and spasms, monitor HR Restrictions LLE Weight Bearing Per Provider Order: Weight bearing as tolerated     Mobility  Bed Mobility Overal bed mobility: Needs Assistance Bed Mobility: Supine to Sit     Supine to sit: Mod assist, +2 for safety/equipment     General bed mobility comments: Required Mod Asisst + 2 esp with scooting to EOB in small increments as well as using bed pad to complete.    Transfers Overall transfer level: Needs assistance Equipment used: Rolling walker (2 wheels) Transfers: Sit to/from Stand, Bed to chair/wheelchair/BSC Sit to Stand: +2 safety/equipment, Max assist   Step pivot transfers: Mod assist, +2  safety/equipment       General transfer comment: patient needed mod A for balance with additional A needed for movement of RW to advance to recliner in room. no buckling noted of either leg but needed cues to breath and for proper hand placement.  observed drop of SPO2 however pt gripping RW (monitor on finger) and SPO2 in 90s on room air with relaxed hand; HR however sustaining in 130-150s bpm with activity, pt performed standing approx 2 minutes to provide hygiene (bed soaked in urine) and then transferred to recliner; pt reports dyspnea with any activity    Ambulation/Gait               General Gait Details: deferred due to elevated HR and dyspnea with activity   Stairs             Wheelchair Mobility     Tilt Bed    Modified Rankin (Stroke Patients Only)       Balance Overall balance assessment: History of Falls, Needs assistance         Standing balance support: Bilateral upper extremity supported, During functional activity, Reliant on assistive device for balance Standing balance-Leahy Scale: Poor                              Communication Communication Communication: Impaired Factors Affecting Communication: Hearing impaired  Cognition Arousal: Alert Behavior During Therapy: WFL for tasks assessed/performed   PT - Cognitive impairments: Safety/Judgement, Problem solving, Sequencing  Following commands: Intact      Cueing Cueing Techniques: Verbal cues  Exercises      General Comments        Pertinent Vitals/Pain Pain Assessment Pain Assessment: Faces Faces Pain Scale: Hurts whole lot Pain Location: L hip and back with movement Pain Descriptors / Indicators: Discomfort, Grimacing, Operative site guarding Pain Intervention(s): Monitored during session, Repositioned    Home Living Family/patient expects to be discharged to:: Private residence Living Arrangements: Spouse/significant  other Available Help at Discharge: Family Type of Home: House Home Access: Stairs to enter Entrance Stairs-Rails: Right;Left;Can reach both Entrance Stairs-Number of Steps: 3   Home Layout: Two level;Able to live on main level with bedroom/bathroom Home Equipment: Rollator (4 wheels);Rolling Walker (2 wheels);Cane - single point      Prior Function            PT Goals (current goals can now be found in the care plan section) Progress towards PT goals: Progressing toward goals    Frequency    7X/week      PT Plan      Co-evaluation PT/OT/SLP Co-Evaluation/Treatment: Yes Reason for Co-Treatment: For patient/therapist safety;To address functional/ADL transfers PT goals addressed during session: Mobility/safety with mobility OT goals addressed during session: ADL's and self-care      AM-PAC PT "6 Clicks" Mobility   Outcome Measure  Help needed turning from your back to your side while in a flat bed without using bedrails?: A Lot Help needed moving from lying on your back to sitting on the side of a flat bed without using bedrails?: A Lot Help needed moving to and from a bed to a chair (including a wheelchair)?: A Lot Help needed standing up from a chair using your arms (e.g., wheelchair or bedside chair)?: A Lot Help needed to walk in hospital room?: A Lot Help needed climbing 3-5 steps with a railing? : Total 6 Click Score: 11    End of Session Equipment Utilized During Treatment: Gait belt Activity Tolerance: Patient limited by fatigue;Other (comment) (elevated HR, DOE) Patient left: in chair;with call bell/phone within reach;with family/visitor present;with chair alarm set Nurse Communication: Mobility status PT Visit Diagnosis: Difficulty in walking, not elsewhere classified (R26.2);Muscle weakness (generalized) (M62.81)     Time: 8295-6213 PT Time Calculation (min) (ACUTE ONLY): 19 min  Charges:    $Therapeutic Activity: 8-22 mins PT General Charges $$  ACUTE PT VISIT: 1 Visit                    Henretta Lodge PT, DPT Physical Therapist Acute Rehabilitation Services Office: [phone]    Kati L Payson 06/17/2023, 1:09 PM

## 2023-06-17 NOTE — Evaluation (Signed)
 Occupational Therapy Evaluation Patient Details Name: Carlos Dawson MRN: [id number] DOB: 16-May-1940 Today's Date: 06/17/2023   History of Present Illness   patient is a 83 y.o. male who was admitted to the hospital by the orthopedic service on 5/19 after successful left hip total arthroplasty who is now being seen in consultation due to concern for hypotension, tachycardia and dyspnea with exertion.   Transfer to SDU 5/22 due to A Fib. PMH:CAD, diastolic CHF, COPD on room air, history of DVT, paroxysmal atrial fibrillation status post Watchman procedure, no longer on anticoagulation     Clinical Impressions Patient is a 83 year old male who was admitted for above. Patient was living at home with wife independently prior level. Currently, patient was max A for transfer into chair with +2 needed for safety and movement with lines. Patient's HR noted to range from 125 to 151 bpm with transfer to recliner with nurse made aware. Patient was able to maintain O2 on RA during transfer [facility]. Patient was noted to have decreased functional activity tolerance, decreased endurance, decreased standing balance, decreased safety awareness, and decreased knowledge of AD/AE impacting participation in ADLs. Patient will benefit from continued inpatient follow up therapy, <3 hours/day.      If plan is discharge home, recommend the following:   Two people to help with walking and/or transfers;A lot of help with bathing/dressing/bathroom;Assistance with cooking/housework;Direct supervision/assist for medications management;Assist for transportation;Help with stairs or ramp for entrance;Direct supervision/assist for financial management     Functional Status Assessment   Patient has had a recent decline in their functional status and demonstrates the ability to make significant improvements in function in a reasonable and predictable amount of time.     Equipment Recommendations   None recommended by OT       Precautions/Restrictions   Precautions Precautions: Fall Precaution/Restrictions Comments: Reports R (non-surgical) hip buckles and spasms, monitor HR Restrictions Weight Bearing Restrictions Per Provider Order: Yes LLE Weight Bearing Per Provider Order: Weight bearing as tolerated     Mobility Bed Mobility Overal bed mobility: Needs Assistance Bed Mobility: Supine to Sit     Supine to sit: Mod assist, +2 for safety/equipment     General bed mobility comments: Required Mod Asisst + 2 esp with scooting to EOB in small increments as well as using bed pad to complete.    Transfers       Sit to Stand: +2 safety/equipment, Max assist           General transfer comment: patient needed mod A for balance with additional A needed for movement of RW to advance to recliner in room. no buckling noted of either leg but needed cues to breath and for proper hand placement.      Balance Overall balance assessment: History of Falls, Needs assistance Sitting-balance support: Feet supported Sitting balance-Leahy Scale: Good     Standing balance support: Bilateral upper extremity supported, During functional activity, Reliant on assistive device for balance Standing balance-Leahy Scale: Poor                             ADL either performed or assessed with clinical judgement   ADL Overall ADL's : Needs assistance/impaired Eating/Feeding: Modified independent;Sitting   Grooming: Sitting;Wash/dry face;Set up   Upper Body Bathing: Sitting;Minimal assistance   Lower Body Bathing: Sitting/lateral leans;Total assistance   Upper Body Dressing : Sitting;Minimal assistance   Lower Body Dressing: Sitting/lateral leans;Total assistance  Toilet Transfer: Moderate assistance;+2 for safety/equipment;Stand-pivot;Rolling walker (2 wheels) Toilet Transfer Details (indicate cue type and reason): no buckling of RLE noted today. Toileting- Architect and Hygiene:  Sit to/from stand;Total assistance Toileting - Clothing Manipulation Details (indicate cue type and reason): noted to have saturated bed prior to getting up. patient provided with hgyiene care prior to getting to recliner.  nurse made aware.             Vision Baseline Vision/History: 1 Wears glasses Vision Assessment?: No apparent visual deficits     Perception         Praxis         Pertinent Vitals/Pain Pain Assessment Pain Assessment: Faces Faces Pain Scale: Hurts whole lot Pain Location: L hip and back with movement Pain Descriptors / Indicators: Discomfort, Grimacing, Operative site guarding Pain Intervention(s): Limited activity within patient's tolerance, Monitored during session, Premedicated before session     Extremity/Trunk Assessment Upper Extremity Assessment Upper Extremity Assessment: Overall WFL for tasks assessed   Lower Extremity Assessment Lower Extremity Assessment: Defer to PT evaluation   Cervical / Trunk Assessment Cervical / Trunk Assessment: Back Surgery;Neck Surgery   Communication     Cognition Arousal: Alert Behavior During Therapy: WFL for tasks assessed/performed Cognition: No apparent impairments                               Following commands: Intact       Cueing  General Comments   Cueing Techniques: Verbal cues              Home Living Family/patient expects to be discharged to:: Private residence Living Arrangements: Spouse/significant other Available Help at Discharge: Family Type of Home: House Home Access: Stairs to enter [employer] of Steps: 3 Entrance Stairs-Rails: Right;Left;Can reach both Home Layout: Two level;Able to live on main level with bedroom/bathroom     Bathroom Shower/Tub: Producer, television/film/video: Standard Bathroom Accessibility: Yes   Home Equipment: Rollator (4 wheels);Rolling Walker (2 wheels);Cane - single point          Prior  Functioning/Environment Prior Level of Function : Independent/Modified Independent;Driving             Mobility Comments: mod I with use of rollator for all ADLs and self care tasks      OT Problem List: Impaired balance (sitting and/or standing);Decreased knowledge of precautions;Decreased safety awareness;Cardiopulmonary status limiting activity;Decreased activity tolerance;Decreased knowledge of use of DME or AE;Pain   OT Treatment/Interventions: DME and/or AE instruction;Balance training;Self-care/ADL training;Therapeutic activities;Energy conservation;Patient/family education      OT Goals(Current goals can be found in the care plan section)   Acute Rehab OT Goals Patient Stated Goal: to get better OT Goal Formulation: With patient Time For Goal Achievement: 07/01/23 Potential to Achieve Goals: Fair   OT Frequency:  Min 2X/week    Co-evaluation PT/OT/SLP Co-Evaluation/Treatment: Yes Reason for Co-Treatment: For patient/therapist safety;To address functional/ADL transfers PT goals addressed during session: Mobility/safety with mobility OT goals addressed during session: ADL's and self-care      AM-PAC OT "6 Clicks" Daily Activity     Outcome Measure Help from another person eating meals?: A Little Help from another person taking care of personal grooming?: A Little Help from another person toileting, which includes using toliet, bedpan, or urinal?: Total Help from another person bathing (including washing, rinsing, drying)?: Total Help from another person to put on and taking off regular upper  body clothing?: A Little Help from another person to put on and taking off regular lower body clothing?: A Lot 6 Click Score: 13   End of Session Equipment Utilized During Treatment: Gait belt;Rolling walker (2 wheels) Nurse Communication: Mobility status;Other (comment) (patients bed status upon arrival in room)  Activity Tolerance: Patient tolerated treatment well Patient  left: in chair;with call bell/phone within reach;with chair alarm set  OT Visit Diagnosis: Unsteadiness on feet (R26.81);Other abnormalities of gait and mobility (R26.89);Muscle weakness (generalized) (M62.81);Pain                Time: 1610-9604 OT Time Calculation (min): 19 min Charges:  OT General Charges $OT Visit: 1 Visit OT Evaluation $OT Eval Moderate Complexity: 1 Mod  Deletha Jaffee OTR/L, MS Acute Rehabilitation Department Office# [phone]   Jame Maze 06/17/2023, 12:11 PM

## 2023-06-18 ENCOUNTER — Inpatient Hospital Stay (HOSPITAL_COMMUNITY)

## 2023-06-18 DIAGNOSIS — I2510 Atherosclerotic heart disease of native coronary artery without angina pectoris: Secondary | ICD-10-CM | POA: Diagnosis not present

## 2023-06-18 DIAGNOSIS — I4729 Other ventricular tachycardia: Secondary | ICD-10-CM | POA: Diagnosis not present

## 2023-06-18 DIAGNOSIS — K567 Ileus, unspecified: Secondary | ICD-10-CM

## 2023-06-18 DIAGNOSIS — I480 Paroxysmal atrial fibrillation: Secondary | ICD-10-CM | POA: Diagnosis not present

## 2023-06-18 DIAGNOSIS — I5033 Acute on chronic diastolic (congestive) heart failure: Secondary | ICD-10-CM | POA: Diagnosis not present

## 2023-06-18 LAB — TYPE AND SCREEN
ABO/RH(D): O POS
Antibody Screen: NEGATIVE
Unit division: 0

## 2023-06-18 LAB — BASIC METABOLIC PANEL WITH GFR
Anion gap: 11 (ref 5–15)
BUN: 36 mg/dL — ABNORMAL HIGH (ref 8–23)
CO2: 19 mmol/L — ABNORMAL LOW (ref 22–32)
Calcium: 8.4 mg/dL — ABNORMAL LOW (ref 8.9–10.3)
Chloride: 102 mmol/L (ref 98–111)
Creatinine, Ser: 1.26 mg/dL — ABNORMAL HIGH (ref 0.61–1.24)
GFR, Estimated: 57 mL/min — ABNORMAL LOW (ref >60)
Glucose, Bld: 193 mg/dL — ABNORMAL HIGH (ref 70–99)
Potassium: 3.2 mmol/L — ABNORMAL LOW (ref 3.5–5.1)
Sodium: 132 mmol/L — ABNORMAL LOW (ref 135–145)

## 2023-06-18 LAB — CBC
HCT: 30 % — ABNORMAL LOW (ref 39.0–52.0)
Hemoglobin: 9.9 g/dL — ABNORMAL LOW (ref 13.0–17.0)
MCH: 36.3 pg — ABNORMAL HIGH (ref 26.0–34.0)
MCHC: 33 g/dL (ref 30.0–36.0)
MCV: 109.9 fL — ABNORMAL HIGH (ref 80.0–100.0)
Platelets: 198 10*3/uL (ref 150–400)
RBC: 2.73 MIL/uL — ABNORMAL LOW (ref 4.22–5.81)
RDW: 16.6 % — ABNORMAL HIGH (ref 11.5–15.5)
WBC: 10.2 10*3/uL (ref 4.0–10.5)
nRBC: 1 % — ABNORMAL HIGH (ref 0.0–0.2)

## 2023-06-18 LAB — GLUCOSE, CAPILLARY
Glucose-Capillary: 162 mg/dL — ABNORMAL HIGH (ref 70–99)
Glucose-Capillary: 140 mg/dL — ABNORMAL HIGH (ref 70–99)
Glucose-Capillary: 134 mg/dL — ABNORMAL HIGH (ref 70–99)
Glucose-Capillary: 139 mg/dL — ABNORMAL HIGH (ref 70–99)

## 2023-06-18 LAB — BPAM RBC
Blood Product Expiration Date: 202506252359
ISSUE DATE / TIME: 202505231802
Unit Type and Rh: 5100

## 2023-06-18 LAB — MAGNESIUM: Magnesium: 2.2 mg/dL (ref 1.7–2.4)

## 2023-06-18 MED ORDER — DEXTROSE 10 % IV SOLN: INTRAVENOUS | Status: DC

## 2023-06-18 MED ORDER — METOPROLOL TARTRATE 5 MG/5ML IV SOLN
5.0000 mg | Freq: Four times a day (QID) | INTRAVENOUS | Status: DC
Start: 2023-06-18 — End: 2023-06-21
  Administered 2023-06-18 – 2023-06-21 (×11): 5 mg via INTRAVENOUS
  Filled 2023-06-18 (×11): qty 5

## 2023-06-18 MED ORDER — POTASSIUM CHLORIDE 10 MEQ/100ML IV SOLN
10.0000 meq | INTRAVENOUS | Status: AC
  Administered 2023-06-18 (×4): 10 meq via INTRAVENOUS
  Filled 2023-06-18 (×4): qty 100

## 2023-06-18 MED ORDER — CARMEX CLASSIC LIP BALM EX OINT
1.0000 | TOPICAL_OINTMENT | CUTANEOUS | Status: DC | PRN
  Filled 2023-06-18: qty 10

## 2023-06-18 MED ORDER — POTASSIUM CHLORIDE CRYS ER 20 MEQ PO TBCR: 40.0000 meq | EXTENDED_RELEASE_TABLET | Freq: Once | ORAL | Status: DC

## 2023-06-18 MED ORDER — TORSEMIDE 20 MG PO TABS
20.0000 mg | ORAL_TABLET | Freq: Every day | ORAL | Status: DC
  Filled 2023-06-18: qty 1

## 2023-06-18 MED ORDER — POTASSIUM CHLORIDE CRYS ER 20 MEQ PO TBCR
20.0000 meq | EXTENDED_RELEASE_TABLET | Freq: Every evening | ORAL | Status: DC
Start: 2023-06-18 — End: 2023-06-18

## 2023-06-18 MED ORDER — SALINE SPRAY 0.65 % NA SOLN
1.0000 | NASAL | Status: DC | PRN
  Administered 2023-06-18: 1 via NASAL
  Filled 2023-06-18: qty 44

## 2023-06-18 MED ORDER — SODIUM CHLORIDE 0.9% FLUSH
10.0000 mL | Freq: Two times a day (BID) | INTRAVENOUS | Status: DC
  Administered 2023-06-18: 20 mL
  Administered 2023-06-18: 10 mL
  Administered 2023-06-19: 30 mL
  Administered 2023-06-19: 20 mL
  Administered 2023-06-20 – 2023-06-22 (×5): 10 mL
  Administered 2023-06-22: 20 mL
  Administered 2023-06-23: 10 mL
  Administered 2023-06-25: 40 mL

## 2023-06-18 MED ORDER — INSULIN ASPART 100 UNIT/ML IJ SOLN
0.0000 [IU] | INTRAMUSCULAR | Status: DC
  Administered 2023-06-18 – 2023-06-19 (×5): 1 [IU] via SUBCUTANEOUS
  Administered 2023-06-19: 2 [IU] via SUBCUTANEOUS
  Administered 2023-06-19: 1 [IU] via SUBCUTANEOUS
  Administered 2023-06-20 (×2): 2 [IU] via SUBCUTANEOUS
  Administered 2023-06-20: 3 [IU] via SUBCUTANEOUS
  Administered 2023-06-20 (×3): 2 [IU] via SUBCUTANEOUS
  Administered 2023-06-21: 3 [IU] via SUBCUTANEOUS
  Administered 2023-06-21: 2 [IU] via SUBCUTANEOUS
  Administered 2023-06-21: 3 [IU] via SUBCUTANEOUS

## 2023-06-18 MED ORDER — ASPIRIN 300 MG RE SUPP
300.0000 mg | Freq: Every day | RECTAL | Status: DC
  Administered 2023-06-18 – 2023-06-20 (×3): 300 mg via RECTAL
  Filled 2023-06-18 (×3): qty 1

## 2023-06-18 MED ORDER — SODIUM CHLORIDE 0.9% FLUSH: 10.0000 mL | INTRAVENOUS | Status: DC | PRN

## 2023-06-18 MED ORDER — SODIUM CHLORIDE 0.9 % IV SOLN: INTRAVENOUS | Status: AC | PRN

## 2023-06-18 MED ORDER — FUROSEMIDE 10 MG/ML IJ SOLN
40.0000 mg | Freq: Every day | INTRAMUSCULAR | Status: DC
  Administered 2023-06-18 – 2023-06-20 (×3): 40 mg via INTRAVENOUS
  Filled 2023-06-18 (×3): qty 4

## 2023-06-18 NOTE — Progress Notes (Addendum)
 PHARMACY - TOTAL PARENTERAL NUTRITION CONSULT NOTE   Indication: Prolonged ileus  Patient Measurements: Height: 5' 5.5" (166.4 cm) Weight: 114.2 kg (251 lb 12.3 oz) IBW/kg (Calculated) : 62.65 TPN AdjBW (KG): 75.6 Body mass index is 41.26 kg/m. Usual Weight: 235lb on 05/29/23  Assessment:  83 YO male admitted by the orthopedic service on 5/19 after L hip total arthroplasty. Continued admission given concern for hypotension, tachycardia, and dyspnea with exertion (cardiology following). Per 5/24 KUB, likely ileus. Most recent intake documented 5/25 as patient eating 50% of meals. Documented as patient having 4 bowel movements today. Given the cut-off time of noon for TPN, pharmacy will follow-up initiation of TPN tomorrow, 5/27.  Plan: - TPN labs tomorrow - K 3.2 - IV KCl 10mEq x4 ordered - Start D10 @40ml /hr - sSSI with CBG checks q4h

## 2023-06-18 NOTE — Progress Notes (Signed)
 Peripherally Inserted Central Catheter Placement  The IV Nurse has discussed with the patient and/or persons authorized to consent for the patient, the purpose of this procedure and the potential benefits and risks involved with this procedure.  The benefits include less needle sticks, lab draws from the catheter, and the patient may be discharged home with the catheter. Risks include, but not limited to, infection, bleeding, blood clot (thrombus formation), and puncture of an artery; nerve damage and irregular heartbeat and possibility to perform a PICC exchange if needed/ordered by physician.  Alternatives to this procedure were also discussed.  Bard Power PICC patient education guide, fact sheet on infection prevention and patient information card has been provided to patient /or left at bedside.  Consent signed at bedside by wife due to altered mental status.  PICC Placement Documentation  PICC Double Lumen 06/18/23 Right Brachial 39 cm 0 cm (Active)  Indication for Insertion or Continuance of Line Administration of hyperosmolar/irritating solutions (i.e. TPN, Vancomycin , etc.) 06/18/23 1405  Exposed Catheter (cm) 0 cm 06/18/23 1405  Site Assessment Clean, Dry, Intact 06/18/23 1405  Lumen #1 Status Saline locked;Flushed;Blood return noted 06/18/23 1405  Lumen #2 Status Saline locked;Flushed;Blood return noted 06/18/23 1405  Dressing Type Transparent;Securing device 06/18/23 1405  Dressing Status Antimicrobial disc/dressing in place;Clean, Dry, Intact 06/18/23 1405  Line Care Connections checked and tightened 06/18/23 1405  Line Adjustment (NICU/IV Team Only) No 06/18/23 1405  Dressing Intervention New dressing;Adhesive placed at insertion site (IV team only) 06/18/23 1405  Dressing Change Due 07/10/2023 06/18/23 1405       Yocheved Depner, Miles Allan 06/18/2023, 2:06 PM

## 2023-06-18 NOTE — Progress Notes (Signed)
 Subjective: 7 Days Post-Op Procedure(s) (LRB): ARTHROPLASTY, HIP, TOTAL, ANTERIOR APPROACH (Left) Patient seen in rounds for Dr. France Ina. Patient reports doing okay this AM. Complains of continued abdominal pain although bowel movements x 4 past 24 hours. Reports pain in hip as mild to moderate. Has not been able to work much with physical therapy secondary to HR.  Objective: Vital signs in last 24 hours: Temp:  [97.9 F (36.6 C)-98.3 F (36.8 C)] 98.1 F (36.7 C) (05/26 0730) Pulse Rate:  [91-111] 91 (05/26 0800) Resp:  [13-24] 17 (05/26 0800) BP: (93-142)/(52-96) 116/73 (05/26 0800) SpO2:  [87 %-97 %] 92 % (05/26 0800) Weight:  [114.2 kg] 114.2 kg (05/26 0635)  Intake/Output from previous day:  Intake/Output Summary (Last 24 hours) at 06/18/2023 1018 Last data filed at 06/18/2023 0654 Gross per 24 hour  Intake 120 ml  Output 1500 ml  Net -1380 ml    Intake/Output this shift: No intake/output data recorded.  Labs: Recent Labs    06/15/23 2201 06/16/23 0314 06/17/23 0304 06/18/23 0310  HGB 8.7* 9.1* 9.4* 9.9*   Recent Labs    06/17/23 0304 06/18/23 0310  WBC 7.3 10.2  RBC 2.34* 2.73*  HCT 26.9* 30.0*  PLT 158 198   Recent Labs    06/17/23 0304 06/18/23 0310  NA 135 132*  K 3.7 3.2*  CL 107 102  CO2 21* 19*  BUN 30* 36*  CREATININE 1.03 1.26*  GLUCOSE 130* 193*  CALCIUM  8.3* 8.4*   No results for input(s): "LABPT", "INR" in the last 72 hours.  Exam: General - Patient is Alert and Oriented Extremity - Neurologically intact Neurovascular intact Sensation intact distally Dorsiflexion/Plantar flexion intact Dressing/Incision - clean, dry, no drainage Motor Function - intact, moving foot and toes well on exam.  Past Medical History:  Diagnosis Date   Anticoagulant long-term use    Aortic root enlargement (HCC)    Ascending aortic aneurysm (HCC)    recent scan in October 2012 showing no change; followed by Dr. Nicanor Barge   ASCVD (arteriosclerotic  cardiovascular disease)    Prior BMS to the 2nd OM in September 2012; with repeat cath in October showing patency   CAD (coronary artery disease)    a. s/p BMS to 2nd OM in Sept 2012; b. LexiScan  Myoview  (12/2012):  Inf infarct; bowel and motion artifact make study difficult to interpret; no ischemia; not gated; Low Risk   CHF (congestive heart failure) (HCC)    no recent issues 10/13/14   Chronic back pain    "all over my back" (05/11/2017)   Colonic polyp    Contact lens/glasses fitting    COPD (chronic obstructive pulmonary disease) (HCC)    Diastolic dysfunction    DVT (deep venous thrombosis) (HCC)    ?LLE   GERD (gastroesophageal reflux disease)    Hearing loss    Hearing loss    more so on left   Hemorrhoids    History of stomach ulcers    Hypertension    IBS (irritable bowel syndrome)    LVH (left ventricular hypertrophy)    OA (osteoarthritis)    "all over" (05/11/2017)   Obesities, morbid (HCC)    OSA (obstructive sleep apnea)    PSG 03/30/97 AHI 21, BPAP 13/9   OSA on CPAP    PAF (paroxysmal atrial fibrillation) (HCC)    a. on Xarelto  b. s/p DCCV in 08/2016; b. Tikosyn  failed 04/16/17 with plans for Multaq  and possible Afib ablation with Dr. Nunzio Belch  Pneumonia    'several times" (05/11/2017)   Presence of Watchman left atrial appendage closure device 06/15/2022   24mm Watchman FLX Pro placed by Dr. Marven Slimmer   Prostate CA [facility])    Oncologist  DR. Cristal [facility] dx 09/24/14, undetermined tx   prostate; S/P "radiation and hormone injections"   Pulmonary embolism (HCC) 2008   "both lungs"   SOB (shortness of breath)    on excertion   Thoracic aortic aneurysm (HCC)    Aortic Size Index=     5.0    /Body surface area is 2.43 meters squared. = 2.05  < 2.75 cm/m2      4% risk per year 2.75 to 4.25          8% risk per year > 4.25 cm/m2    20% risk per year   Stable aneurysmal dilation of the ascending aorta with maximum AP diameter of 4.8 cm. Stable area of narrowing of the  proximal most portion of the descending aorta measuring 2 cm., previously identified as an area of coarctation. No evidence of aortic dissection.  Coronary artery disease.  Normal appearance of the lungs.   Electronically Signed   By: Dobrinka  Dimitrova M.D.   On: 10/01/2014 08:50     Type II diabetes mellitus (HCC)    metphormin, average 154 dx 2017    Assessment/Plan: 7 Days Post-Op Procedure(s) (LRB): ARTHROPLASTY, HIP, TOTAL, ANTERIOR APPROACH (Left) Principal Problem:   NSVT (nonsustained ventricular tachycardia) (HCC) Active Problems:   OA (osteoarthritis) of hip   Paroxysmal atrial fibrillation (HCC)   Hypotension   SOB (shortness of breath)   Primary osteoarthritis of left hip   Status post hip replacement   Acute blood loss anemia  Estimated body mass index is 41.26 kg/m as calculated from the following:   Height as of this encounter: 5' 5.5" (1.664 m).   Weight as of this encounter: 114.2 kg.  DVT Prophylaxis - Xarelto  Weight-bearing as tolerated.  Plan to premedicate patient prior to PT today to help with HR. Continue PT while admitted to maximize mobility and strength with plan to d/c home when appropriate. Appreciate hospitalists' and cardiology care of patient.  R. Brinton Canavan, PA-C Orthopedic Surgery 06/18/2023, 10:18 AM

## 2023-06-18 NOTE — Progress Notes (Addendum)
 Rounding Note    Patient Name: Carlos Dawson Date of Encounter: 06/18/2023  [facility] Cardiologist: Peter [name], MD   Subjective   BP 109/57.  Mild bump in Cr (1.03>1.26).  Hgb 9.9.  -1.8 L yesterday, -3.6 L on admission.  Reports dyspnea improved.  Reports worsening abdominal pain  Inpatient Medications    Scheduled Meds:  sodium chloride    Intravenous Once   aspirin  EC  81 mg Oral Daily   Chlorhexidine  Gluconate Cloth  6 each Topical Daily   insulin  aspart  0-15 Units Subcutaneous TID WC   loratadine   10 mg Oral Daily   metoprolol  tartrate  25 mg Oral BID   pantoprazole   40 mg Oral Daily   potassium chloride   20 mEq Oral QPM   potassium chloride  SA  40 mEq Oral Daily   rivaroxaban   10 mg Oral Q breakfast   sennosides  10 mL Oral BID   tamsulosin   0.4 mg Oral QPM   topiramate   50 mg Oral BID   tranexamic acid  (CYKLOKAPRON ) 2,000 mg in sodium chloride  0.9 % 50 mL Topical Application  2,000 mg Topical Once   Continuous Infusions:  PRN Meds: acetaminophen , albuterol , alum & mag hydroxide-simeth, budesonide -formoterol , cyclobenzaprine , HYDROcodone -acetaminophen , HYDROcodone -acetaminophen , HYDROmorphone  (DILAUDID ) injection, lip balm, magnesium  citrate, menthol -cetylpyridinium **OR** phenol, metoCLOPramide  **OR** metoCLOPramide  (REGLAN ) injection, nitroGLYCERIN , ondansetron  **OR** ondansetron  (ZOFRAN ) IV, mouth rinse, polyethylene glycol, simethicone , tiotropium   Vital Signs    Vitals:   06/17/23 2306 06/18/23 0635 06/18/23 0730 06/18/23 0745  BP:    (!) 109/57  Pulse:    (!) 111  Resp:    16  Temp: 98.2 F (36.8 C)  98.1 F (36.7 C)   TempSrc: Oral  Oral   SpO2:    97%  Weight:  114.2 kg    Height:        Intake/Output Summary (Last 24 hours) at 06/18/2023 0915 Last data filed at 06/18/2023 0654 Gross per 24 hour  Intake 480 ml  Output 1900 ml  Net -1420 ml      06/18/2023    6:35 AM 06/17/2023    1:45 AM 06/16/2023    5:00 AM  Last 3  Weights  Weight (lbs) 251 lb 12.3 oz 255 lb 11.7 oz 256 lb 6.3 oz  Weight (kg) 114.2 kg 116 kg 116.3 kg      Telemetry    Sinus with frequent PACs, rate 80s- Personally Reviewed  ECG    No new ECG- Personally Reviewed  Physical Exam   GEN: No acute distress.   Neck: No JVD Cardiac: Irregular, normal rate no murmurs, rubs, or gallops.  Respiratory: Diminished breath sounds GI: Distended MS: Trace edema; No deformity. Neuro:  Nonfocal  Psych: Normal affect   Labs    High Sensitivity Troponin:  No results for input(s): "TROPONINIHS" in the last 720 hours.   Chemistry Recent Labs  Lab 06/16/23 0314 06/17/23 0304 06/18/23 0310  NA 134* 135 132*  K 3.5 3.7 3.2*  CL 106 107 102  CO2 23 21* 19*  GLUCOSE 159* 130* 193*  BUN 32* 30* 36*  CREATININE 1.13 1.03 1.26*  CALCIUM  8.4* 8.3* 8.4*  MG 2.2 2.3 2.2  GFRNONAA >60 >60 57*  ANIONGAP 5 7 11     Lipids No results for input(s): "CHOL", "TRIG", "HDL", "LABVLDL", "LDLCALC", "CHOLHDL" in the last 168 hours.  Hematology Recent Labs  Lab 06/16/23 0314 06/17/23 0304 06/18/23 0310  WBC 8.1 7.3 10.2  RBC 2.60* 2.34* 2.73*  HGB 9.1* 9.4* 9.9*  HCT 28.5* 26.9* 30.0*  MCV 109.6* 115.0* 109.9*  MCH 35.0* 40.2* 36.3*  MCHC 31.9 34.9 33.0  RDW 17.3* 18.1* 16.6*  PLT 163 158 198   Thyroid  No results for input(s): "TSH", "FREET4" in the last 168 hours.  BNP Recent Labs  Lab 06/16/23 0314  BNP 342.4*    DDimer No results for input(s): "DDIMER" in the last 168 hours.   Radiology    DG Abd 1 View Result Date: 06/16/2023 CLINICAL DATA:  Constipation, lower abdominal pain, distended abdomen EXAM: ABDOMEN - 1 VIEW COMPARISON:  06/11/2023 FINDINGS: Two supine frontal views of the abdomen and pelvis are obtained. There is marked gaseous distention of the colon consistent with ileus. No evidence of small-bowel obstruction. No masses or abnormal calcifications. Postsurgical changes are seen from thoracolumbar spinal fusion and  bilateral hip arthroplasties. IMPRESSION: 1. Diffuse gaseous distention of the colon most consistent with ileus. No evidence of small-bowel obstruction. Electronically Signed   By: Bobbye Burrow M.D.   On: 06/16/2023 22:37    Cardiac Studies     Patient Profile     83 y.o. male  history of atrial fibrillation status post ablation in 2019 and Watchman in 05/2022 (due to hematuria), CAD status post PCI in 2012, chronic diastolic heart failure, PE, OSA, T2DM, thoracic aortic aneurysm who we are consulted for evaluation of possible atrial fibrillation   Assessment & Plan    Paroxysmal atrial fibrillation: Status post ablation 04/2017 and Watchman 05/2022.   - Cardiology was consulted due to concern for A-fib with RVR.  From review of telemetry, appears sinus with frequent PACs.  He had been started on IV amiodarone, was discontinued.   -Continue metoprolol .  Since now NPO with ileus, will switch to scheduled IV metoprolol  -Maintain K greater than 4, mag greater than 2.  Potassium low this morning, PO repleation ordered but now that NPO will switch to IV repletion - He remains in sinus rhythm with frequent PACs, though heart rate improved.  Suspect anemia was contributing to his tachycardia, has improved since PRBC transfusion.    Acute on chronic diastolic heart failure: Echocardiogram 5/23 shows EF 70 to 75%, normal RV function.  Significantly volume overloaded after his surgery - He has diuresed well, mild bump in creatinine today, suspect approaching euvolemia.  Planned to transition to PO today, but with ileus now unable to take PO.  Will decrease IV lasix  to once daily  CAD: Status post BMS to OM2 in 2012.  Cardiac catheterization in 2019 showed patent stents.  He denies any chest pain.  Notably, he had a stress PET on 10/24 which was high risk study due to marked reduction in myocardial blood flow reserve (though question accuracy in setting of prior PCI), can follow-up as outpatient and  consider further ischemic evaluation -Restarted ASA 81 mg daily given history of coronary stenting.  Now unable to take PO, will start rectal ASA  Anemia: Following hip surgery, hemoglobin dropped from 11.2 on 5/6 to 7.9 on 5/23.  Would transfuse for goal hemoglobin greater than 8 given cardiac history.  Received 1 unit PRBCs on 5/23, hemoglobin 9.9 today  Ileus: worsening abdominal pain, unable to take PO.  Management per primary team  For questions or updates, please contact [facility] Please consult [email or web address] for contact info under        Signed, Wendie Hamburg, MD  06/18/2023, 9:15 AM

## 2023-06-18 NOTE — Plan of Care (Signed)
  Problem: Health Behavior/Discharge Planning: Goal: Ability to manage health-related needs will improve Outcome: Progressing   Problem: Clinical Measurements: Goal: Ability to maintain clinical measurements within normal limits will improve Outcome: Progressing Goal: Diagnostic test results will improve Outcome: Progressing Goal: Respiratory complications will improve Outcome: Progressing   Problem: Clinical Measurements: Goal: Cardiovascular complication will be avoided Outcome: Not Progressing   Problem: Activity: Goal: Risk for activity intolerance will decrease Outcome: Not Progressing

## 2023-06-18 NOTE — Progress Notes (Signed)
 PROGRESS NOTE    Patient: Carlos Dawson                            PCP: Tena Feeling, MD                    DOB: [birth year]            DOA: 06/11/2023 OZH:[id number]             DOS: 06/18/2023, 9:59 AM   LOS: 4 days   Date of Service: The patient was seen and examined on 06/18/2023  Subjective:   The patient was seen examined this morning, stable no acute distress Hemodynamically stable, heart rate and blood pressure has stabilized. Still requiring 2 L of oxygen , satting 92% Diuresing well Bowel movements x 4 past 24 hours Following clinical diet   Brief Narrative:   Carlos Dawson is a 83 y.o. male with medical history significant for CAD, diastolic CHF, COPD on room air, history of DVT, paroxysmal atrial fibrillation status post Watchman procedure, no longer on anticoagulation who was admitted to the hospital by the orthopedic service on 5/19 after successful left hip total arthroplasty who is now being seen in consultation due to concern for hypotension, tachycardia and dyspnea with exertion.  .     Assessment & Plan:   Principal Problem:   NSVT (nonsustained ventricular tachycardia) (HCC) Active Problems:   OA (osteoarthritis) of hip   Hypotension   Status post hip replacement   Primary osteoarthritis of left hip   Paroxysmal atrial fibrillation (HCC)   SOB (shortness of breath)   Acute blood loss anemia    Tachycardia, with history of A-fib -Rate has stabilized - Post hip arthroplasty patient was noted to be hypotensive, tachycardic thought to be in A-fib with RVR  -History of paroxysmal A-fib s/p ablation May 11, 2017, Watchman device May 2024 - 06/15/2023 -started on amiodarone drip overnight, IV fluids-subsequently was discontinued -  continue metoprolol  PO - Electrolytes potassium magnesium  -replacing - Heart rate blood pressure has improved  Acute hypoxic respiratory failure -Requiring 2 L of oxygen , satting 92% - Satting 87% on room air,  continue O2 supplements,  - maintaining O2 sat greater than 92% - Shortness of breath, hypoxia exacerbated by volume overload, ileus, abdominal distention - Bronchodilators as needed   History of paroxysmal atrial fibrillation, -Heart rate improved -  s/p ablation April 2019, Watchman device 2024 - 06/15/2023 discontinue IV amiodarone, heparin  - Continue Lopressor  25 mg p.o. twice daily for rate control per cardiology - Appreciate cardiology following closely  Hypotensive -postop (h/o HTN) -BP is improved-stabilized -Holding antihypertensive medications, stopping Jardiance  due to hypotension -Improved, continue metoprolol  25 mg twice daily - 06/15/23 s/p 1U PRBC blood transfusion  Total hip arthroplasty Postop day # 7 -Remains stable-pending PT evaluation today - Orthopedic team following, - Pain management per Ortho  Ileus - KUB consistent with ileus  - Likely postop ileus due to reduced mobility  - Continuing bowel regimen, replating electrolytes/P4 BM over past 24 hours - Encourage ambulation - Continue to liquid diet, will  advance as tolerated  history of CAD, -Denies any chest pain - With history of cardiac cath, stenting in 2012, - Continue home medication of aspirin , intolerant to statins  HFpEF - ?  Pulmonary hypertension - Last echo 11/12/22 ejection fraction 60-65%, 1 diastolic dysfunction - Cardiology mildly volume overloaded, will need to be diuresed, with blood pressures  remain soft - Cardiology continuing IV Lasix >> to p.o. torsemide per cardiology - Will monitor I's and O's  Intake/Output Summary (Last 24 hours) at 06/18/2023 0959 Last data filed at 06/18/2023 0654 Gross per 24 hour  Intake 480 ml  Output 1900 ml  Net -1420 ml      Acute on chronic anemia -exacerbated postop and with IVF - Cardiology recommendation hemoglobin needs to be >8 Which also may have been contributed to hypotension, and tachycardia    Latest Ref Rng & Units 06/18/2023     3:10 AM 06/17/2023    3:04 AM 06/16/2023    3:14 AM  CBC  WBC 4.0 - 10.5 K/uL 10.2  7.3  8.1   Hemoglobin 13.0 - 17.0 g/dL 9.9  9.4  9.1   Hematocrit 39.0 - 52.0 % 30.0  26.9  28.5   Platelets 150 - 400 K/uL 198  158  163   - Hemoglobin improved along with the tachycardia and hypertension 06/15/23 s/p 1 unit PRBC blood transfusion  History of thoracic aortic aneurysm - 4.9 cm on last CT, continue follows outpatient  History of PE - Currently on Xarelto        ----------------------------------------------------------------------------------------------------------------------------------------------- Nutritional status:  The patient's BMI is: Body mass index is 41.26 kg/m. I agree with the assessment and plan as outlined  Skin Assessment: I have examined the patient's skin and I agree with the wound assessment as performed by wound care team As outlined belowe: Pressure Injury 06/14/23 Buttocks Mid Deep Tissue Pressure Injury - Purple or maroon localized area of discolored intact skin or blood-filled blister due to damage of underlying soft tissue from pressure and/or shear. Area of non-blanching purple skin on (Active)  06/14/23 1830  Location: Buttocks  Location Orientation: Mid  Staging: Deep Tissue Pressure Injury - Purple or maroon localized area of discolored intact skin or blood-filled blister due to damage of underlying soft tissue from pressure and/or shear.  Wound Description (Comments): Area of non-blanching purple skin on buttocks  Present on Admission: Yes  Dressing Type Foam - Lift dressing to assess site every shift;Impregnated gauze (petrolatum) 06/17/23 2000    ---------------------------------------------------------------------------------------------------------------------  DVT prophylaxis:  rivaroxaban  (XARELTO ) tablet 10 mg Start: 06/12/23 0800 Place TED hose Start: 06/11/23 1651 SCDs Start: 06/11/23 1433 rivaroxaban  (XARELTO ) tablet 10 mg   Code  Status:   Code Status: Full Code  Family Communication: Wife present at bedside -Advance care planning has been discussed.   Admission status:   Status is: Inpatient Remains inpatient appropriate because: Needing close monitoring in critical care, with A-fib, anemia, needing diuretics, blood transfusion   Disposition: From  - home             Planning for discharge in 2 -3  days: to Home  w HH   Procedures:   PR ARTHRP ACETBLR/PROX FEM PROSTC AGRFT/ALGRFT [27130]   Antimicrobials:  Anti-infectives (From admission, onward)    Start     Dose/Rate Route Frequency Ordered Stop   06/11/23 1830  ceFAZolin  (ANCEF ) IVPB 2g/100 mL premix        2 g 200 mL/hr over 30 Minutes Intravenous Every 6 hours 06/11/23 1651 06/12/23 0747   06/11/23 1030  ceFAZolin  (ANCEF ) IVPB 2g/100 mL premix        2 g 200 mL/hr over 30 Minutes Intravenous On call to O.R. 06/11/23 1025 06/11/23 1300        Medication:   sodium chloride    Intravenous Once   aspirin  EC  81 mg  Oral Daily   Chlorhexidine  Gluconate Cloth  6 each Topical Daily   insulin  aspart  0-15 Units Subcutaneous TID WC   loratadine   10 mg Oral Daily   metoprolol  tartrate  25 mg Oral BID   pantoprazole   40 mg Oral Daily   potassium chloride  SA  40 mEq Oral Daily   potassium chloride   40 mEq Oral Once   rivaroxaban   10 mg Oral Q breakfast   sennosides  10 mL Oral BID   tamsulosin   0.4 mg Oral QPM   topiramate   50 mg Oral BID   torsemide  20 mg Oral Daily   tranexamic acid  (CYKLOKAPRON ) 2,000 mg in sodium chloride  0.9 % 50 mL Topical Application  2,000 mg Topical Once    acetaminophen , albuterol , alum & mag hydroxide-simeth, budesonide -formoterol , cyclobenzaprine , HYDROcodone -acetaminophen , HYDROcodone -acetaminophen , HYDROmorphone  (DILAUDID ) injection, lip balm, magnesium  citrate, menthol -cetylpyridinium **OR** phenol, metoCLOPramide  **OR** metoCLOPramide  (REGLAN ) injection, nitroGLYCERIN , ondansetron  **OR** ondansetron  (ZOFRAN ) IV,  mouth rinse, polyethylene glycol, simethicone , tiotropium   Objective:   Vitals:   06/18/23 0635 06/18/23 0730 06/18/23 0745 06/18/23 0800  BP:   (!) 109/57 116/73  Pulse:   (!) 111 91  Resp:   16 17  Temp:  98.1 F (36.7 C)    TempSrc:  Oral    SpO2:   97% 92%  Weight: 114.2 kg     Height:        Intake/Output Summary (Last 24 hours) at 06/18/2023 0959 Last data filed at 06/18/2023 0654 Gross per 24 hour  Intake 480 ml  Output 1900 ml  Net -1420 ml   Filed Weights   06/16/23 0500 06/17/23 0145 06/18/23 0635  Weight: 116.3 kg 116 kg 114.2 kg     Physical examination:      General:  AAO x 3,  cooperative, no distress;   HEENT:  Normocephalic, PERRL, otherwise with in Normal limits   Neuro:  CNII-XII intact. , normal motor and sensation, reflexes intact   Lungs:   Clear to auscultation BL, Respirations unlabored,  No wheezes / crackles  Cardio:    S1/S2, RRR, No murmure, No Rubs or Gallops   Abdomen:  Soft, non-tender, bowel sounds active all four quadrants, no guarding or peritoneal signs.  Muscular  skeletal:  Limited exam -global generalized weaknesses - in bed, able to move all 4 extremities,   2+ pulses,  symmetric, No pitting edema  Skin:  Dry, warm to touch, negative for any Rashes,  Wounds: Please see nursing documentation  Pressure Injury 06/14/23 Buttocks Mid Deep Tissue Pressure Injury - Purple or maroon localized area of discolored intact skin or blood-filled blister due to damage of underlying soft tissue from pressure and/or shear. Area of non-blanching purple skin on (Active)  06/14/23 1830  Location: Buttocks  Location Orientation: Mid  Staging: Deep Tissue Pressure Injury - Purple or maroon localized area of discolored intact skin or blood-filled blister due to damage of underlying soft tissue from pressure and/or shear.  Wound Description (Comments): Area of non-blanching purple skin on buttocks  Present on Admission: Yes  Dressing Type Foam -  Lift dressing to assess site every shift;Impregnated gauze (petrolatum) 06/17/23 2000         ----------------------------------------------------------------------------------------------------------------------    LABs:     Latest Ref Rng & Units 06/18/2023    3:10 AM 06/17/2023    3:04 AM 06/16/2023    3:14 AM  CBC  WBC 4.0 - 10.5 K/uL 10.2  7.3  8.1   Hemoglobin 13.0 - 17.0  g/dL 9.9  9.4  9.1   Hematocrit 39.0 - 52.0 % 30.0  26.9  28.5   Platelets 150 - 400 K/uL 198  158  163       Latest Ref Rng & Units 06/18/2023    3:10 AM 06/17/2023    3:04 AM 06/16/2023    3:14 AM  CMP  Glucose 70 - 99 mg/dL 161  096  045   BUN 8 - 23 mg/dL 36  30  32   Creatinine 0.61 - 1.24 mg/dL 4.09  8.11  9.14   Sodium 135 - 145 mmol/L 132  135  134   Potassium 3.5 - 5.1 mmol/L 3.2  3.7  3.5   Chloride 98 - 111 mmol/L 102  107  106   CO2 22 - 32 mmol/L 19  21  23    Calcium  8.9 - 10.3 mg/dL 8.4  8.3  8.4        Micro Results Recent Results (from the past 240 hours)  MRSA Next Gen by PCR, Nasal     Status: None   Collection Time: 06/14/23  6:32 PM   Specimen: Nasal Mucosa; Nasal Swab  Result Value Ref Range Status   MRSA by PCR Next Gen NOT DETECTED NOT DETECTED Final    Comment: (NOTE) The GeneXpert MRSA Assay (FDA approved for NASAL specimens only), is one component of a comprehensive MRSA colonization surveillance program. It is not intended to diagnose MRSA infection nor to guide or monitor treatment for MRSA infections. Test performance is not FDA approved in patients less than 37 years old. Performed at [facility] [place] [facility], [address] [place], [place] [address]     Radiology Reports No results found.    SIGNED: Bobbetta Burnet, MD, FHM. FAAFP. Arlin Benes - [facility] hospitalist Critical care time spent - 55 min.  In seeing, evaluating and examining the patient. Reviewing medical records, labs, drawn plan of care. [facility],  Pager (please use  [email or web address] to page/ text) Please use Epic Secure Chat for non-urgent communication (7AM-7PM)  If 7PM-7AM, please contact night-coverage [email or web address], 06/18/2023, 9:59 AM

## 2023-06-18 NOTE — Progress Notes (Signed)
 PT Cancellation Note  Patient Details Name: DUGAN VANHOESEN MRN: [id number] DOB: 22-May-1940   Cancelled Treatment:     Not a good day today for Pt.  Multiple new orders/work up/PICC line insertion. Rehab Team to continue to monitor and attempt to see another day.    Bess Broody  PTA Acute  Rehabilitation Services Office M-F          [phone]

## 2023-06-18 NOTE — Progress Notes (Signed)
 Initial Nutrition Assessment  DOCUMENTATION CODES:   Obesity unspecified  INTERVENTION:  - TPN to start tomorrow.  - TPN management per pharmacy.   - Clear Liquid diet per MD.    NUTRITION DIAGNOSIS:   Altered GI function related to acute illness (ileus) as evidenced by other (comment) (need for TPN per MD).  GOAL:   Patient will meet greater than or equal to 90% of their needs  MONITOR:   PO intake, Diet advancement, Labs, Weight trends, I & O's  REASON FOR ASSESSMENT:   Consult Assessment of nutrition requirement/status, New TPN/TNA  ASSESSMENT:   83 y.o. male wit PMH significant for CAD, diastolic CHF, COPD, paroxysmal atrial fibrillation who was admitted to the hospital by the orthopedic service on 5/19 after successful left hip total arthroplasty who then began having issues with hypotension, tachycardia and dyspnea with exertion.   5/19 S/p left total hip arthroplasty; Admitted 5/24 CLD; abdominal xray showing ileus  RD working remotely. Attempted to reach patient via bedside telephone but no answer.   Per chart review, no significant weight loss in the past year. Current weights are elevated, suspect from fluid/edema.   Abdominal xray 5/24 showed ileus. Patient noted to have been consuming 55-100% of meals with average of 89% prior to diet being downgraded to clears.  Per MD notes, patient had 4 BM's the past 24 hours. However, he is now ordered to get an NGT (suspect for suction) and a PICC ordered for TPN. Consult to pharmacy was received after 12pm cut off today, so TPN to likely start tomorrow evening.    Admit weight: 234# Current weight: 251# I&O's: -3.5L since admit + for moderate pitting edema  Medications reviewed and include: Lasix , Senokot  Labs reviewed:  Na 132 K+ 3.2 Creatinine 1.26   NUTRITION - FOCUSED PHYSICAL EXAM:  RD working remotely  Diet Order:   Diet Order             Diet clear liquid Room service appropriate? Yes;  Fluid consistency: Thin  Diet effective now           Diet - low sodium heart healthy                   EDUCATION NEEDS:  Not appropriate for education at this time  Skin:  Skin Assessment: Skin Integrity Issues: Skin Integrity Issues:: DTI, Incisions DTI: Buttocks Incisions: Left Hip  Last BM:  5/26 - type 7  Height:  Ht Readings from Last 1 Encounters:  06/14/23 5' 5.5" (1.664 m)   Weight:  Wt Readings from Last 1 Encounters:  06/18/23 114.2 kg    BMI:  Body mass index is 41.26 kg/m.  Estimated Nutritional Needs:  Kcal:  1900-2100 kcals Protein:  95-115 grams Fluid:  >/= 1.9L    Scheryl Cushing RD, LDN Contact via Secure Chat.

## 2023-06-19 ENCOUNTER — Inpatient Hospital Stay (HOSPITAL_COMMUNITY)

## 2023-06-19 ENCOUNTER — Other Ambulatory Visit: Payer: Self-pay

## 2023-06-19 DIAGNOSIS — I4729 Other ventricular tachycardia: Secondary | ICD-10-CM | POA: Diagnosis not present

## 2023-06-19 DIAGNOSIS — I480 Paroxysmal atrial fibrillation: Secondary | ICD-10-CM | POA: Diagnosis not present

## 2023-06-19 LAB — URINALYSIS, ROUTINE W REFLEX MICROSCOPIC
Bacteria, UA: NONE SEEN
Bilirubin Urine: NEGATIVE
Glucose, UA: >=500 mg/dL — AB
Hgb urine dipstick: NEGATIVE
Ketones, ur: NEGATIVE mg/dL
Leukocytes,Ua: NEGATIVE
Nitrite: NEGATIVE
Protein, ur: NEGATIVE mg/dL
Specific Gravity, Urine: 1.017 (ref 1.005–1.030)
pH: 5 (ref 5.0–8.0)

## 2023-06-19 LAB — CBC
HCT: 28.9 % — ABNORMAL LOW (ref 39.0–52.0)
Hemoglobin: 9.2 g/dL — ABNORMAL LOW (ref 13.0–17.0)
MCH: 33.8 pg (ref 26.0–34.0)
MCHC: 31.8 g/dL (ref 30.0–36.0)
MCV: 106.3 fL — ABNORMAL HIGH (ref 80.0–100.0)
Platelets: 223 10*3/uL (ref 150–400)
RBC: 2.72 MIL/uL — ABNORMAL LOW (ref 4.22–5.81)
RDW: 16.6 % — ABNORMAL HIGH (ref 11.5–15.5)
WBC: 8.2 10*3/uL (ref 4.0–10.5)
nRBC: 1 % — ABNORMAL HIGH (ref 0.0–0.2)

## 2023-06-19 LAB — COMPREHENSIVE METABOLIC PANEL WITH GFR
ALT: 14 U/L (ref 0–44)
AST: 17 U/L (ref 15–41)
Albumin: 2.7 g/dL — ABNORMAL LOW (ref 3.5–5.0)
Alkaline Phosphatase: 68 U/L (ref 38–126)
Anion gap: 8 (ref 5–15)
BUN: 44 mg/dL — ABNORMAL HIGH (ref 8–23)
CO2: 24 mmol/L (ref 22–32)
Calcium: 8 mg/dL — ABNORMAL LOW (ref 8.9–10.3)
Chloride: 101 mmol/L (ref 98–111)
Creatinine, Ser: 1.22 mg/dL (ref 0.61–1.24)
GFR, Estimated: 60 mL/min — ABNORMAL LOW (ref >60)
Glucose, Bld: 131 mg/dL — ABNORMAL HIGH (ref 70–99)
Potassium: 3.1 mmol/L — ABNORMAL LOW (ref 3.5–5.1)
Sodium: 133 mmol/L — ABNORMAL LOW (ref 135–145)
Total Bilirubin: 1.6 mg/dL — ABNORMAL HIGH (ref 0.0–1.2)
Total Protein: 5.4 g/dL — ABNORMAL LOW (ref 6.5–8.1)

## 2023-06-19 LAB — GLUCOSE, CAPILLARY
Glucose-Capillary: 122 mg/dL — ABNORMAL HIGH (ref 70–99)
Glucose-Capillary: 122 mg/dL — ABNORMAL HIGH (ref 70–99)
Glucose-Capillary: 123 mg/dL — ABNORMAL HIGH (ref 70–99)
Glucose-Capillary: 125 mg/dL — ABNORMAL HIGH (ref 70–99)
Glucose-Capillary: 130 mg/dL — ABNORMAL HIGH (ref 70–99)
Glucose-Capillary: 151 mg/dL — ABNORMAL HIGH (ref 70–99)

## 2023-06-19 LAB — TSH: TSH: 1.8 u[IU]/mL (ref 0.350–4.500)

## 2023-06-19 LAB — BRAIN NATRIURETIC PEPTIDE: B Natriuretic Peptide: 300.9 pg/mL — ABNORMAL HIGH (ref 0.0–100.0)

## 2023-06-19 LAB — MAGNESIUM: Magnesium: 2.1 mg/dL (ref 1.7–2.4)

## 2023-06-19 LAB — AMMONIA: Ammonia: 51 umol/L — ABNORMAL HIGH (ref 9–35)

## 2023-06-19 LAB — PHOSPHORUS: Phosphorus: 3.2 mg/dL (ref 2.5–4.6)

## 2023-06-19 MED ORDER — POTASSIUM CHLORIDE 10 MEQ/100ML IV SOLN
10.0000 meq | INTRAVENOUS | Status: AC
  Administered 2023-06-19 (×5): 10 meq via INTRAVENOUS
  Filled 2023-06-19 (×5): qty 100

## 2023-06-19 MED ORDER — PANTOPRAZOLE SODIUM 40 MG IV SOLR
40.0000 mg | INTRAVENOUS | Status: DC
  Administered 2023-06-19 – 2023-06-22 (×4): 40 mg via INTRAVENOUS
  Filled 2023-06-19 (×4): qty 10

## 2023-06-19 MED ORDER — IOHEXOL 9 MG/ML PO SOLN
ORAL | Status: AC
  Filled 2023-06-19: qty 1000

## 2023-06-19 MED ORDER — IOHEXOL 9 MG/ML PO SOLN
500.0000 mL | ORAL | Status: AC
  Administered 2023-06-19 (×2): 500 mL via ORAL

## 2023-06-19 MED ORDER — IOHEXOL 300 MG/ML  SOLN
100.0000 mL | Freq: Once | INTRAMUSCULAR | Status: AC | PRN
  Administered 2023-06-19: 100 mL via INTRAVENOUS

## 2023-06-19 MED ORDER — DEXTROSE 70 % IV SOLN
INTRAVENOUS | Status: AC
  Filled 2023-06-19: qty 499.2

## 2023-06-19 MED ORDER — RIVAROXABAN 10 MG PO TABS
10.0000 mg | ORAL_TABLET | Freq: Every day | ORAL | Status: DC
  Administered 2023-06-19: 10 mg
  Filled 2023-06-19: qty 1

## 2023-06-19 MED ORDER — LORATADINE 10 MG PO TABS: 10.0000 mg | ORAL_TABLET | Freq: Every day | ORAL | Status: DC

## 2023-06-19 MED ORDER — SENNOSIDES 8.8 MG/5ML PO SYRP
10.0000 mL | ORAL_SOLUTION | Freq: Two times a day (BID) | ORAL | Status: DC
  Administered 2023-06-20: 10 mL
  Filled 2023-06-19 (×7): qty 10

## 2023-06-19 MED ORDER — THIAMINE HCL 100 MG/ML IJ SOLN
100.0000 mg | Freq: Every day | INTRAMUSCULAR | Status: AC
  Administered 2023-06-19 – 2023-06-23 (×5): 100 mg via INTRAVENOUS
  Filled 2023-06-19 (×5): qty 2

## 2023-06-19 NOTE — Progress Notes (Signed)
 Rounding Note    Patient Name: Carlos Dawson Date of Encounter: 06/19/2023  [facility] Cardiologist: Malick Netz [name], MD   Subjective   More lethargic with continued distended abdomen Spoke with wife/son Not really taking PO  Inpatient Medications    Scheduled Meds:  sodium chloride    Intravenous Once   aspirin   300 mg Rectal Daily   Chlorhexidine  Gluconate Cloth  6 each Topical Daily   furosemide   40 mg Intravenous Daily   insulin  aspart  0-9 Units Subcutaneous Q4H   loratadine   10 mg Oral Daily   metoprolol  tartrate  5 mg Intravenous Q6H   pantoprazole   40 mg Oral Daily   rivaroxaban   10 mg Oral Q breakfast   sennosides  10 mL Oral BID   sodium chloride  flush  10-40 mL Intracatheter Q12H   tamsulosin   0.4 mg Oral QPM   tranexamic acid  (CYKLOKAPRON ) 2,000 mg in sodium chloride  0.9 % 50 mL Topical Application  2,000 mg Topical Once   Continuous Infusions:  sodium chloride  Stopped (06/18/23 1701)   potassium chloride  10 mEq (06/19/23 0732)   PRN Meds: sodium chloride , acetaminophen , albuterol , alum & mag hydroxide-simeth, budesonide -formoterol , cyclobenzaprine , HYDROcodone -acetaminophen , HYDROcodone -acetaminophen , HYDROmorphone  (DILAUDID ) injection, lip balm, magnesium  citrate, menthol -cetylpyridinium **OR** phenol, metoCLOPramide  **OR** metoCLOPramide  (REGLAN ) injection, nitroGLYCERIN , ondansetron  **OR** ondansetron  (ZOFRAN ) IV, mouth rinse, polyethylene glycol, simethicone , sodium chloride , sodium chloride  flush, tiotropium   Vital Signs    Vitals:   06/19/23 0400 06/19/23 0435 06/19/23 0500 06/19/23 0600  BP: (!) 141/87  (!) 125/47   Pulse: 99  (!) 122 88  Resp: 14  16 12   Temp: 98.2 F (36.8 C)     TempSrc: Oral     SpO2: 100%  93% 100%  Weight:  109.4 kg    Height:        Intake/Output Summary (Last 24 hours) at 06/19/2023 0811 Last data filed at 06/19/2023 0617 Gross per 24 hour  Intake 544.51 ml  Output 1900 ml  Net -1355.49 ml       06/19/2023    4:35 AM 06/18/2023    6:35 AM 06/17/2023    1:45 AM  Last 3 Weights  Weight (lbs) 241 lb 2.9 oz 251 lb 12.3 oz 255 lb 11.7 oz  Weight (kg) 109.4 kg 114.2 kg 116 kg      Telemetry    Sinus with frequent PACs, rate 80s- Personally Reviewed  ECG    No new ECG- Personally Reviewed  Physical Exam   Lethargic but arounsable Volume overload plus one bilateral edema Distended abdomen tympanitic not tender Decrease BS base No murmur  Post lumbar surgery   Labs    High Sensitivity Troponin:  No results for input(s): "TROPONINIHS" in the last 720 hours.   Chemistry Recent Labs  Lab 06/17/23 0304 06/18/23 0310 06/19/23 0405  NA 135 132* 133*  K 3.7 3.2* 3.1*  CL 107 102 101  CO2 21* 19* 24  GLUCOSE 130* 193* 131*  BUN 30* 36* 44*  CREATININE 1.03 1.26* 1.22  CALCIUM  8.3* 8.4* 8.0*  MG 2.3 2.2 2.1  PROT  --   --  5.4*  ALBUMIN   --   --  2.7*  AST  --   --  17  ALT  --   --  14  ALKPHOS  --   --  68  BILITOT  --   --  1.6*  GFRNONAA >60 57* 60*  ANIONGAP 7 11 8     Lipids No results for  input(s): "CHOL", "TRIG", "HDL", "LABVLDL", "LDLCALC", "CHOLHDL" in the last 168 hours.  Hematology Recent Labs  Lab 06/17/23 0304 06/18/23 0310 06/19/23 0405  WBC 7.3 10.2 8.2  RBC 2.34* 2.73* 2.72*  HGB 9.4* 9.9* 9.2*  HCT 26.9* 30.0* 28.9*  MCV 115.0* 109.9* 106.3*  MCH 40.2* 36.3* 33.8  MCHC 34.9 33.0 31.8  RDW 18.1* 16.6* 16.6*  PLT 158 198 223   Thyroid  No results for input(s): "TSH", "FREET4" in the last 168 hours.  BNP Recent Labs  Lab 06/16/23 0314  BNP 342.4*    DDimer No results for input(s): "DDIMER" in the last 168 hours.   Radiology    DG CHEST PORT 1 VIEW Result Date: 06/18/2023 CLINICAL DATA:  Status post PICC placement. EXAM: PORTABLE CHEST 1 VIEW COMPARISON:  06/14/2023 FINDINGS: New right upper extremity PICC tip at the SVC brachiocephalic confluence. No pneumothorax. Tip and side port of enteric tube below the diaphragm in the stomach.  Stable heart size and mediastinal contours. Bandlike atelectasis at the left lung base. Slight improvement in right basilar atelectasis from prior. No pulmonary edema or large pleural effusion. Left costophrenic angle is excluded from the field of view. IMPRESSION: 1. New right upper extremity PICC tip at the SVC brachiocephalic confluence. No pneumothorax. 2. Bibasilar atelectasis, slightly improved on the right. Electronically Signed   By: Chadwick Colonel M.D.   On: 06/18/2023 14:21   DG Abd 1 View Result Date: 06/18/2023 CLINICAL DATA:  Nasogastric tube placement EXAM: ABDOMEN - 1 VIEW COMPARISON:  06/16/2023 FINDINGS: A nasogastric tube is been placed with tip and side-port in the gastric fundus. Dilated loops of small bowel up to 5.2 cm in diameter. Gaseous prominence of the colon. Postoperative findings in the thoracolumbar spine, right SI joint, and both hips. Lap band noted with 1:30-7:30 clock face orientation on this frontal projection. Bibasilar bandlike densities favoring atelectasis. Lower thoracic spondylosis. IMPRESSION: 1. Nasogastric tube tip and side-port in the gastric fundus. 2. Dilated loops of small bowel up to 5.2 cm in diameter. Gaseous prominence of the colon. Appearance could be from ileus or bowel obstruction. 3. Bibasilar atelectasis. 4. Postoperative findings in the thoracolumbar spine, right SI joint, and both hips. Electronically Signed   By: Freida Jes M.D.   On: 06/18/2023 13:00   US  EKG SITE RITE Result Date: 06/18/2023 If Site Rite image not attached, placement could not be confirmed due to current cardiac rhythm.   Cardiac Studies     Patient Profile     83 y.o. male  history of atrial fibrillation status post ablation in 2019 and Watchman in 05/2022 (due to hematuria), CAD status post PCI in 2012, chronic diastolic heart failure, PE, OSA, T2DM, thoracic aortic aneurysm who we are consulted for evaluation of possible atrial fibrillation   Assessment & Plan     Paroxysmal atrial fibrillation: Status post ablation 04/2017 and Watchman 05/2022.   - His telemetry shows mostly NSR with PAC;s may look like NSVT but he has a baseline RBBB  - Lack of PO intake and ileus complicates Rx - PRN iv inderal and consider iv amiodarone  - supplement K   Acute on chronic diastolic heart failure: Echocardiogram 5/23 shows EF 70 to 75%, normal RV function.  Significantly volume overloaded after his surgery - He has diuresed well, continue iv Rx and transition to PO when taking PO -I/O's - 1.3 L's Cr stable 1.2  CAD: Status post BMS to OM2 in 2012.  Cardiac catheterization in 2019 showed  patent stents.  He denies any chest pain.  Notably, he had a stress PET on 10/24 which was high risk study due to marked reduction in myocardial blood flow reserve (though question accuracy in setting of prior PCI), can follow-up as outpatient and consider further ischemic evaluation -Restarted ASA 81 mg daily given history of coronary stenting.  Now unable to take PO, will start rectal ASA  Anemia: Following hip surgery, hemoglobin dropped from 11.2 on 5/6 to 7.9 on 5/23.  Would transfuse for goal hemoglobin greater than 8 given cardiac history.  Received 1 unit PRBCs on 5/23, hemoglobin 9.2 today  Ileus: worsening abdominal pain, unable to take PO.  Management per primary team KUB pending from this am   For questions or updates, please contact [facility] Please consult [email or web address] for contact info under        Signed, Janelle Mediate, MD  06/19/2023, 8:11 AM

## 2023-06-19 NOTE — *Deleted (Incomplete)
 Patients son, Zekiah Caruth., expressed concerns and frustrations related to his fathers care. He is requesting that nephrology be involved and would like to speak to the gastroenterology in the morning. He is

## 2023-06-19 NOTE — Progress Notes (Signed)
 Subjective: 8 Days Post-Op Procedure(s) (LRB): ARTHROPLASTY, HIP, TOTAL, ANTERIOR APPROACH (Left) Patient seen in rounds by Dr. France Ina. No issues overnight. Reports hip is fine.  Objective: Vital signs in last 24 hours: Temp:  [98 F (36.7 C)-98.9 F (37.2 C)] 98.2 F (36.8 C) (05/27 0400) Pulse Rate:  [77-122] 88 (05/27 0600) Resp:  [11-22] 12 (05/27 0600) BP: (106-141)/(46-87) 125/47 (05/27 0500) SpO2:  [89 %-100 %] 100 % (05/27 0600) Weight:  [109.4 kg] 109.4 kg (05/27 0435)  Intake/Output from previous day:  Intake/Output Summary (Last 24 hours) at 06/19/2023 0808 Last data filed at 06/19/2023 0617 Gross per 24 hour  Intake 544.51 ml  Output 1900 ml  Net -1355.49 ml    Intake/Output this shift: No intake/output data recorded.  Labs: Recent Labs    06/17/23 0304 06/18/23 0310 06/19/23 0405  HGB 9.4* 9.9* 9.2*   Recent Labs    06/18/23 0310 06/19/23 0405  WBC 10.2 8.2  RBC 2.73* 2.72*  HCT 30.0* 28.9*  PLT 198 223   Recent Labs    06/18/23 0310 06/19/23 0405  NA 132* 133*  K 3.2* 3.1*  CL 102 101  CO2 19* 24  BUN 36* 44*  CREATININE 1.26* 1.22  GLUCOSE 193* 131*  CALCIUM  8.4* 8.0*   No results for input(s): "LABPT", "INR" in the last 72 hours.  Exam: General - Patient is Alert and Oriented Extremity - Neurologically intact Neurovascular intact Sensation intact distally Dorsiflexion/Plantar flexion intact Dressing/Incision - clean, dry, no drainage Motor Function - intact, moving foot and toes well on exam.  Past Medical History:  Diagnosis Date   Anticoagulant long-term use    Aortic root enlargement (HCC)    Ascending aortic aneurysm (HCC)    recent scan in October 2012 showing no change; followed by Dr. Nicanor Barge   ASCVD (arteriosclerotic cardiovascular disease)    Prior BMS to the 2nd OM in September 2012; with repeat cath in October showing patency   CAD (coronary artery disease)    a. s/p BMS to 2nd OM in Sept 2012; b. LexiScan   Myoview  (12/2012):  Inf infarct; bowel and motion artifact make study difficult to interpret; no ischemia; not gated; Low Risk   CHF (congestive heart failure) (HCC)    no recent issues 10/13/14   Chronic back pain    "all over my back" (05/11/2017)   Colonic polyp    Contact lens/glasses fitting    COPD (chronic obstructive pulmonary disease) (HCC)    Diastolic dysfunction    DVT (deep venous thrombosis) (HCC)    ?LLE   GERD (gastroesophageal reflux disease)    Hearing loss    Hearing loss    more so on left   Hemorrhoids    History of stomach ulcers    Hypertension    IBS (irritable bowel syndrome)    LVH (left ventricular hypertrophy)    OA (osteoarthritis)    "all over" (05/11/2017)   Obesities, morbid (HCC)    OSA (obstructive sleep apnea)    PSG 03/30/97 AHI 21, BPAP 13/9   OSA on CPAP    PAF (paroxysmal atrial fibrillation) (HCC)    a. on Xarelto  b. s/p DCCV in 08/2016; b. Tikosyn  failed 04/16/17 with plans for Multaq  and possible Afib ablation with Dr. Nunzio Belch   Pneumonia    'several times" (05/11/2017)   Presence of Watchman left atrial appendage closure device 06/15/2022   24mm Watchman FLX Pro placed by Dr. Marven Slimmer   Prostate CA [facility])  Oncologist  DR. Cristal Don [facility] 09/24/14, undetermined tx   prostate; S/P "radiation and hormone injections"   Pulmonary embolism (HCC) 2008   "both lungs"   SOB (shortness of breath)    on excertion   Thoracic aortic aneurysm (HCC)    Aortic Size Index=     5.0    /Body surface area is 2.43 meters squared. = 2.05  < 2.75 cm/m2      4% risk per year 2.75 to 4.25          8% risk per year > 4.25 cm/m2    20% risk per year   Stable aneurysmal dilation of the ascending aorta with maximum AP diameter of 4.8 cm. Stable area of narrowing of the proximal most portion of the descending aorta measuring 2 cm., previously identified as an area of coarctation. No evidence of aortic dissection.  Coronary artery disease.  Normal appearance of the  lungs.   Electronically Signed   By: Dobrinka  Dimitrova M.D.   On: 10/01/2014 08:50     Type II diabetes mellitus (HCC)    metphormin, average 154 dx 2017    Assessment/Plan: 8 Days Post-Op Procedure(s) (LRB): ARTHROPLASTY, HIP, TOTAL, ANTERIOR APPROACH (Left) Principal Problem:   NSVT (nonsustained ventricular tachycardia) (HCC) Active Problems:   OA (osteoarthritis) of hip   Paroxysmal atrial fibrillation (HCC)   Hypotension   SOB (shortness of breath)   Primary osteoarthritis of left hip   Status post hip replacement   Acute blood loss anemia   Ileus (HCC)  Estimated body mass index is 39.52 kg/m as calculated from the following:   Height as of this encounter: 5' 5.5" (1.664 m).   Weight as of this encounter: 109.4 kg.  DVT Prophylaxis - Xarelto  Weight-bearing as tolerated.  NG tube and PICC placed yesterday due to prolonged ileus. Continue physical therapy for hip when appropriate.  R. Brinton Canavan, PA-C Orthopedic Surgery 06/19/2023, 8:08 AM

## 2023-06-19 NOTE — Plan of Care (Signed)
  Problem: Education: Goal: Knowledge of General Education information will improve Description: Including pain rating scale, medication(s)/side effects and non-pharmacologic comfort measures Outcome: Not Progressing   Problem: Clinical Measurements: Goal: Respiratory complications will improve Outcome: Not Progressing Goal: Cardiovascular complication will be avoided Outcome: Not Progressing   Problem: Coping: Goal: Level of anxiety will decrease Outcome: Not Progressing   Problem: Pain Managment: Goal: General experience of comfort will improve and/or be controlled Outcome: Not Progressing   Problem: Skin Integrity: Goal: Risk for impaired skin integrity will decrease Outcome: Not Progressing   Problem: Education: Goal: Knowledge of General Education information will improve Description: Including pain rating scale, medication(s)/side effects and non-pharmacologic comfort measures Outcome: Not Progressing   Problem: Pain Managment: Goal: General experience of comfort will improve and/or be controlled Outcome: Not Progressing

## 2023-06-19 NOTE — Consult Note (Signed)
 [facility] Consultation Note  Referring Provider: PCCM Primary Care Physician:  Tena Feeling, MD Primary Gastroenterologist:  Dr. Feliberto Hopping  Reason for Consultation:  ileus  HPI: Carlos Dawson is a 83 y.o. male recent hip surgery.  Multiple comorbidities and rocky post-operative course.  We are asked to see patient for abdominal distention, minimal stool/flatus output.  Xray suggestive ileus.  Unable to obtain history from patient.  No significant improvement after nasogastric tube placement.   Past Medical History:  Diagnosis Date   Anticoagulant long-term use    Aortic root enlargement (HCC)    Ascending aortic aneurysm (HCC)    recent scan in October 2012 showing no change; followed by Dr. Nicanor Barge   ASCVD (arteriosclerotic cardiovascular disease)    Prior BMS to the 2nd OM in September 2012; with repeat cath in October showing patency   CAD (coronary artery disease)    a. s/p BMS to 2nd OM in Sept 2012; b. LexiScan  Myoview  (12/2012):  Inf infarct; bowel and motion artifact make study difficult to interpret; no ischemia; not gated; Low Risk   CHF (congestive heart failure) (HCC)    no recent issues 10/13/14   Chronic back pain    "all over my back" (05/11/2017)   Colonic polyp    Contact lens/glasses fitting    COPD (chronic obstructive pulmonary disease) (HCC)    Diastolic dysfunction    DVT (deep venous thrombosis) (HCC)    ?LLE   GERD (gastroesophageal reflux disease)    Hearing loss    Hearing loss    more so on left   Hemorrhoids    History of stomach ulcers    Hypertension    IBS (irritable bowel syndrome)    LVH (left ventricular hypertrophy)    OA (osteoarthritis)    "all over" (05/11/2017)   Obesities, morbid (HCC)    OSA (obstructive sleep apnea)    PSG 03/30/97 AHI 21, BPAP 13/9   OSA on CPAP    PAF (paroxysmal atrial fibrillation) (HCC)    a. on Xarelto  b. s/p DCCV in 08/2016; b. Tikosyn  failed 04/16/17 with plans for Multaq  and possible Afib  ablation with Dr. Nunzio Belch   Pneumonia    'several times" (05/11/2017)   Presence of Watchman left atrial appendage closure device 06/15/2022   24mm Watchman FLX Pro placed by Dr. Marven Slimmer   Prostate [facility])    Oncologist  DR. Cristal [facility] 09/24/14, undetermined tx   prostate; S/P "radiation and hormone injections"   Pulmonary embolism (HCC) 2008   "both lungs"   SOB (shortness of breath)    on excertion   Thoracic aortic aneurysm (HCC)    Aortic Size Index=     5.0    /Body surface area is 2.43 meters squared. = 2.05  < 2.75 cm/m2      4% risk per year 2.75 to 4.25          8% risk per year > 4.25 cm/m2    20% risk per year   Stable aneurysmal dilation of the ascending aorta with maximum AP diameter of 4.8 cm. Stable area of narrowing of the proximal most portion of the descending aorta measuring 2 cm., previously identified as an area of coarctation. No evidence of aortic dissection.  Coronary artery disease.  Normal appearance of the lungs.   Electronically Signed   By: Dobrinka  Dimitrova M.D.   On: 10/01/2014 08:50     Type II diabetes mellitus (HCC)    metphormin, average  154 dx 2017    Past Surgical History:  Procedure Laterality Date   ACHILLES TENDON REPAIR Bilateral    AORTIC ARCH ANGIOGRAPHY N/A 03/13/2017   Procedure: AORTIC ARCH ANGIOGRAPHY;  Surgeon: [name], [name], MD;  Location: [facility] INVASIVE CV LAB;  Service: Cardiovascular;  Laterality: N/A;   APPENDECTOMY     ATRIAL FIBRILLATION ABLATION  05/11/2017   ATRIAL FIBRILLATION ABLATION N/A 05/11/2017   Procedure: ATRIAL FIBRILLATION ABLATION;  Surgeon: Jolly Needle, MD;  Location: [facility] INVASIVE CV LAB;  Service: Cardiovascular;  Laterality: N/A;   BACK SURGERY     "I've had 7 back and 1 neck ORs" (05/11/2017)   BIOPSY  03/14/2018   Procedure: BIOPSY;  Surgeon: Genell Ken, MD;  Location: [facility];  Service: Gastroenterology;;  EGD and Colon   CARDIAC CATHETERIZATION  2006   CARPAL TUNNEL RELEASE Bilateral    LEFT    CATARACT EXTRACTION W/ INTRAOCULAR LENS  IMPLANT, BILATERAL Bilateral    CERVICAL SPINE SURGERY  06/02/2010   lower back and neck   COLONOSCOPY N/A 03/14/2018   Procedure: COLONOSCOPY;  Surgeon: Genell Ken, MD;  Location: [facility];  Service: Gastroenterology;  Laterality: N/A;   COLONOSCOPY WITH PROPOFOL  N/A 12/29/2014   Procedure: COLONOSCOPY WITH PROPOFOL ;  Surgeon: Garrett Kallman, MD;  Location: [facility];  Service: Endoscopy;  Laterality: N/A;   CORONARY ANGIOPLASTY WITH STENT PLACEMENT  October 2012   CORONARY STENT PLACEMENT  Sept 2012   2nd OM with BMS   ESOPHAGOGASTRODUODENOSCOPY N/A 03/14/2018   Procedure: ESOPHAGOGASTRODUODENOSCOPY (EGD);  Surgeon: Genell Ken, MD;  Location: [facility];  Service: Gastroenterology;  Laterality: N/A;   HEMORROIDECTOMY     LAMINECTOMY  05/30/2012   L 4 L5   LAMINECTOMY WITH POSTERIOR LATERAL ARTHRODESIS LEVEL 3 N/A 10/18/2016   Procedure: Posterior Lateral Fusion - Lumbar One-Four, segmental instrumentation Lumbar One-Five,  decompression,;  Surgeon: Isadora Mar, MD;  Location: [facility] OR;  Service: Neurosurgery;  Laterality: N/A;   LAPAROSCOPIC CHOLECYSTECTOMY     LAPAROSCOPIC GASTRIC BANDING     LEFT AND RIGHT HEART CATHETERIZATION WITH CORONARY ANGIOGRAM N/A 05/07/2014   Procedure: LEFT AND RIGHT HEART CATHETERIZATION WITH CORONARY ANGIOGRAM;  Surgeon: [name] [name], MD;  Location: [facility] CATH LAB;  Service: Cardiovascular;  Laterality: N/A;   LEFT ATRIAL APPENDAGE OCCLUSION N/A 06/15/2022   Procedure: LEFT ATRIAL APPENDAGE OCCLUSION;  Surgeon: Boyce Byes, MD;  Location: [facility] INVASIVE CV LAB;  Service: Cardiovascular;  Laterality: N/A;   LEFT HEART CATH AND CORONARY ANGIOGRAPHY N/A 03/13/2017   Procedure: LEFT HEART CATH AND CORONARY ANGIOGRAPHY;  Surgeon: [name], [name], MD;  Location: [facility] INVASIVE CV LAB;  Service: Cardiovascular;  Laterality: N/A;   LUMBAR LAMINECTOMY/DECOMPRESSION MICRODISCECTOMY N/A 05/04/2016   Procedure: Laminectomy and  Foraminotomy - Thoracic twelve-Lumbar one -Posterior Fusion Lumbar one-two;  Surgeon: Isadora Mar, MD;  Location: [facility] OR;  Service: Neurosurgery;  Laterality: N/A;   POLYPECTOMY  03/14/2018   Procedure: POLYPECTOMY;  Surgeon: Genell Ken, MD;  Location: [facility];  Service: Gastroenterology;;   POSTERIOR LUMBAR FUSION  10/18/2016   SHOULDER OPEN ROTATOR CUFF REPAIR Bilateral    TEE WITHOUT CARDIOVERSION N/A 06/15/2022   Procedure: TRANSESOPHAGEAL ECHOCARDIOGRAM;  Surgeon: Boyce Byes, MD;  Location: [facility] INVASIVE CV LAB;  Service: Cardiovascular;  Laterality: N/A;   TONSILLECTOMY AND ADENOIDECTOMY     TOTAL HIP ARTHROPLASTY Right 10/18/2018   Procedure: RIGHT TOTAL HIP ARTHROPLASTY ANTERIOR APPROACH;  Surgeon: Arnie Lao, MD;  Location: [facility] ORS;  Service: Orthopedics;  Laterality: Right;   TOTAL HIP ARTHROPLASTY Left 06/11/2023   Procedure: ARTHROPLASTY, HIP, TOTAL, ANTERIOR APPROACH;  Surgeon: Liliane Rei, MD;  Location: [facility] ORS;  Service: Orthopedics;  Laterality: Left;   TRIGGER FINGER RELEASE     LEFT   UVULOPALATOPHARYNGOPLASTY     VASECTOMY      Prior to Admission medications   Medication Sig Start Date End Date Taking? Authorizing Provider  acetaminophen  (TYLENOL ) 500 MG tablet Take 1,000 mg by mouth every 8 (eight) hours as needed for mild pain or headache.    Yes [provider]  albuterol  (VENTOLIN  HFA) 108 (90 Base) MCG/ACT inhaler Inhale 2 puffs into the lungs every 4 (four) hours as needed for wheezing or shortness of breath. 10/30/22  Yes Parrett, Tammy S, NP  budesonide -formoterol  (SYMBICORT ) 160-4.5 MCG/ACT inhaler Inhale 2 puffs into the lungs daily as needed (Asthma). Patient taking differently: Inhale 2 puffs into the lungs 2 (two) times daily as needed (Asthma). 10/30/22  Yes Parrett, Tammy S, NP  celecoxib  (CELEBREX ) 200 MG capsule Take 200 mg by mouth daily as needed for moderate pain (pain score 4-6). 02/23/23  Yes [provider]   cetirizine (ZYRTEC) 10 MG tablet Take 10 mg by mouth daily.   Yes [provider]  Cholecalciferol  (VITAMIN D3 MAXIMUM STRENGTH) 125 MCG (5000 UT) capsule Take 5,000 Units by mouth daily.   Yes [provider]  Ferrous Sulfate Dried (FEOSOL) 200 (65 Fe) MG TABS Take 1 tablet by mouth every other day.   Yes [provider]  furosemide  (LASIX ) 40 MG tablet TAKE ONE TABLET (40MG ) BY MOUTH EACH DAY AS NEEDED FOR SWELLING OR WEIGHT GAIN. Patient taking differently: Take 40 mg by mouth daily. May take a second 40 mg dose as needed for swelling 03/20/22  Yes [name], [name], MD  JARDIANCE  25 MG TABS tablet Take 25 mg by mouth daily. 04/18/18  Yes [provider]  losartan  (COZAAR ) 25 MG tablet Take 1 tablet (25 mg total) by mouth daily. 03/20/23  Yes [name], [name], MD  pantoprazole  (PROTONIX ) 40 MG tablet TAKE ONE TABLET BY MOUTH BEFORE BREAKFAST (TAKE ON AN EMPTY STOMACH 30 MINUTES PRIOR TO A MEAL) 03/09/21  Yes [provider]  potassium chloride  SA (KLOR-CON  M20) 20 MEQ tablet Taking two tablet by mouth in the am and 1 tablet in the evening 09/22/22  Yes [name], [name], MD  pregabalin (LYRICA) 50 MG capsule Take 50 mg by mouth at bedtime as needed (pain). 02/23/23  Yes [provider]  psyllium (METAMUCIL) 58.6 % packet Take 1 packet by mouth daily as needed (Looose stool).   Yes [provider]  spironolactone  (ALDACTONE ) 25 MG tablet TAKE 1 TABLET (25 MG TOTAL) BY MOUTH DAILY. 05/24/23  Yes [name], [name], MD  tamsulosin  (FLOMAX ) 0.4 MG CAPS Take 0.4 mg by mouth every evening.    Yes [provider]  Tiotropium Bromide  Monohydrate (SPIRIVA  RESPIMAT) 2.5 MCG/ACT AERS Inhale 2 puffs into the lungs daily as needed (shortness of breath).   Yes [provider]  topiramate  (TOPAMAX ) 25 MG capsule Take 50 mg by mouth 2 (two) times daily.    Yes [provider]  umeclidinium bromide  (INCRUSE ELLIPTA ) 62.5 MCG/ACT AEPB Inhale  1 puff into the lungs daily. 03/20/23  Yes Parrett, Tammy S, NP  cyclobenzaprine  (FLEXERIL ) 10 MG tablet Take 1 tablet (10 mg total) by mouth 3 (three) times daily as needed for muscle spasms. 06/13/23   Edmisten, Kristie L, PA  furosemide  (LASIX ) 80 MG tablet Take 0.5 tablets (40 mg total) by mouth daily. Patient not taking: Reported on 05/22/2023 02/27/23 05/28/23  [name], [name], MD  HYDROcodone -acetaminophen  (NORCO/VICODIN) 5-325 MG tablet Take 1-2 tablets by mouth every 6 (six) hours as needed for severe pain (pain score 7-10). 06/13/23   Edmisten, Kristie L, PA  ipratropium (ATROVENT ) 0.03 % nasal spray Place 2 sprays into both nostrils every 12 (twelve) hours. Patient not taking: Reported on 05/22/2023 03/02/23   Parrett, Macdonald Savoy, NP  nitroGLYCERIN  (NITROSTAT ) 0.4 MG SL tablet Place 1 tablet (0.4 mg total) under the tongue every 5 (five) minutes as needed for chest pain. 09/08/21   [name], [name], MD  ondansetron  (ZOFRAN ) 4 MG tablet Take 1 tablet (4 mg total) by mouth every 6 (six) hours as needed for nausea. 06/13/23   Edmisten, Onesimo Bijou, PA  Polyethyl Glycol-Propyl Glycol (SYSTANE OP) Place 1 drop into both eyes daily as needed (for dry eyes).     [provider]  rivaroxaban  (XARELTO ) 10 MG TABS tablet Take 1 tablet (10 mg total) by mouth daily with breakfast for 19 days. Then resume one 81 mg aspirin  once a day 06/14/23 07/03/23  Edmisten, Kristie L, PA  sildenafil  (VIAGRA ) 100 MG tablet Take 1 tablet (100 mg total) by mouth as needed for erectile dysfunction. 07/13/20   [name], [name], MD  Skin Protectants, Misc. (EUCERIN) cream Apply 1 application topically as needed for dry skin.    [provider]    Current Facility-Administered Medications  Medication Dose Route Frequency Provider Last Rate Last Admin   0.9 %  sodium chloride  infusion   Intravenous PRN Aluisio, Frank, MD   Stopped at 06/18/23 1701   acetaminophen  (TYLENOL ) tablet 325-650 mg  325-650 mg Oral Q6H PRN  Amber Bail A, MD   650 mg at 06/16/23 0913   albuterol  (PROVENTIL ) (2.5 MG/3ML) 0.083% nebulizer solution 2.5 mg  2.5 mg Inhalation Q4H PRN Shahmehdi, Seyed A, MD       alum & mag hydroxide-simeth (MAALOX/MYLANTA) 200-200-20 MG/5ML suspension 30 mL  30 mL Oral Q4H PRN Shahmehdi, Seyed A, MD       aspirin  suppository 300 mg  300 mg Rectal Daily Wendie Hamburg, MD   300 mg at 06/18/23 1225   budesonide -formoterol  (SYMBICORT ) 160-4.5 MCG/ACT inhaler 2 puff  2 puff Inhalation BID PRN Shahmehdi, Constantino Demark, MD       Chlorhexidine  Gluconate Cloth 2 % PADS 6 each  6 each Topical Daily Shahmehdi, Seyed A, MD   6 each at 06/18/23 2222   cyclobenzaprine  (FLEXERIL ) tablet 10 mg  10 mg Oral TID PRN Shahmehdi, Seyed A, MD   10 mg at 06/18/23 0653   furosemide  (LASIX ) injection 40 mg  40 mg Intravenous Daily Wendie Hamburg, MD   40 mg at 06/19/23 0908   HYDROcodone -acetaminophen  (NORCO) 7.5-325 MG per tablet 1-2 tablet  1-2 tablet Oral Q4H PRN Eveland, Stephanie Zeman, PA-C   1 tablet at 06/18/23 1010   HYDROcodone -acetaminophen  (NORCO/VICODIN) 5-325 MG per tablet 1-2 tablet  1-2 tablet Oral Q4H PRN Shahmehdi, Seyed A, MD   1 tablet at 06/17/23 2255   HYDROmorphone  (DILAUDID ) injection 0.5-1 mg  0.5-1 mg Intravenous Q2H PRN Shahmehdi, Seyed A, MD   1 mg at 06/19/23 0053   insulin  aspart (novoLOG ) injection 0-9 Units  0-9 Units Subcutaneous Q4H Roselee Cong, RPH   1 Units at 06/19/23 0909   lip balm (CARMEX) ointment 1 Application  1 Application  Topical PRN Shahmehdi, Seyed A, MD       magnesium  citrate solution 1 Bottle  1 Bottle Oral Once PRN Shahmehdi, Constantino Demark, MD       menthol -cetylpyridinium (CEPACOL) lozenge 3 mg  1 lozenge Oral PRN Shahmehdi, Seyed A, MD   3 mg at 06/16/23 1610   Or   phenol (CHLORASEPTIC) mouth spray 1 spray  1 spray Mouth/Throat PRN Shahmehdi, Seyed A, MD   1 spray at 06/18/23 2127   metoCLOPramide  (REGLAN ) tablet 5-10 mg  5-10 mg Oral Q8H PRN Eveland, Stephanie  Zeman, PA-C   10 mg at 06/17/23 2255   Or   metoCLOPramide  (REGLAN ) injection 5-10 mg  5-10 mg Intravenous Q8H PRN Eveland, Stephanie Zeman, PA-C       metoprolol  tartrate (LOPRESSOR ) injection 5 mg  5 mg Intravenous Q6H Wendie Hamburg, MD   5 mg at 06/19/23 0510   nitroGLYCERIN  (NITROSTAT ) SL tablet 0.4 mg  0.4 mg Sublingual Q5 min PRN Shahmehdi, Constantino Demark, MD       ondansetron  (ZOFRAN ) tablet 4 mg  4 mg Oral Q6H PRN Eveland, Stephanie Zeman, PA-C       Or   ondansetron  (ZOFRAN ) injection 4 mg  4 mg Intravenous Q6H PRN Eveland, Stephanie Zeman, PA-C       Oral care mouth rinse  15 mL Mouth Rinse PRN Shahmehdi, Seyed A, MD       pantoprazole  (PROTONIX ) injection 40 mg  40 mg Intravenous Q24H Bell, Michelle T, RPH       polyethylene glycol (MIRALAX  / GLYCOLAX ) packet 17 g  17 g Oral Daily PRN Shahmehdi, Seyed A, MD       potassium chloride  10 mEq in 100 mL IVPB  10 mEq Intravenous Q1 Hr x 5 Daniels, James K, NP 100 mL/hr at 06/19/23 1022 10 mEq at 06/19/23 1022   [START ON 06/20/2023] rivaroxaban  (XARELTO ) tablet 10 mg  10 mg Per Tube Q breakfast Aluisio, Samuel Crock, MD       sennosides (SENOKOT) 8.8 MG/5ML syrup 10 mL  10 mL Per Tube BID Arlyne Bering T, RPH       simethicone  (MYLICON) chewable tablet 80 mg  80 mg Oral QID PRN Amber Bail A, MD   80 mg at 06/17/23 2255   sodium chloride  (OCEAN) 0.65 % nasal spray 1 spray  1 spray Each Nare PRN Daniels, James K, NP   1 spray at 06/18/23 2222   sodium chloride  flush (NS) 0.9 % injection 10-40 mL  10-40 mL Intracatheter Q12H Liliane Rei, MD   20 mL at 06/18/23 2222   sodium chloride  flush (NS) 0.9 % injection 10-40 mL  10-40 mL Intracatheter PRN Liliane Rei, MD       tamsulosin  (FLOMAX ) capsule 0.4 mg  0.4 mg Oral QPM Shahmehdi, Seyed A, MD   0.4 mg at 06/17/23 1810   thiamine (VITAMIN B1) injection 100 mg  100 mg Intravenous Daily Roselee Cong, RPH       tiotropium (SPIRIVA ) inhalation capsule ([facility] use ONLY) 18 mcg  18 mcg Inhalation  Daily PRN Shahmehdi, Constantino Demark, MD       TPN ADULT (ION)   Intravenous Continuous TPN Roselee Cong, [facility]        Allergies as of 04/23/2023 - Review Complete 03/02/2023  Allergen Reaction Noted   Other  11/01/2019   Lotensin [benazepril] Cough 05/28/2021   Tofranil [imipramine]  07/06/2021   Ace inhibitors Cough 04/20/2010   Adhesive [tape] Itching and Rash 09/15/2010  Amoxicillin -pot clavulanate Other (See Comments) 04/20/2010   Codeine Nausea Only 04/20/2010   Latex Itching, Rash, and Other (See Comments) 09/11/2014   Metformin  Diarrhea 03/01/2017   Morphine Itching    Nifedipine Other (See Comments) 04/27/2020   Quinolones Other (See Comments) 12/06/2017    Family History  Problem Relation Age of Onset   Heart disease Mother    Diabetes Mother    Other Mother        stent placement   Emphysema Father 25   Heart attack Sister     Social History   Socioeconomic History   Marital status: Married    Spouse name: Not on file   Number of children: 3   Years of education: Not on file   Highest education level: Not on file  Occupational History   Occupation: Retired from Investment banker, corporate: RETIRED  Tobacco Use   Smoking status: Former    Current packs/day: 0.00    Average packs/day: 1.5 packs/day for 36.0 years (54.0 ttl pk-yrs)    Types: Cigarettes    Start date: 63    Quit date: 01/24/1992    Years since quitting: 31.4   Smokeless tobacco: Never  Vaping Use   Vaping status: Never Used  Substance and Sexual Activity   Alcohol use: No   Drug use: No   Sexual activity: Not Currently  Other Topics Concern   Not on file  Social History Narrative   Not on file   Social Drivers of Health   Financial Resource Strain: Not on file  Food Insecurity: No Food Insecurity (06/11/2023)   Hunger Vital Sign    Worried About Running Out of Food in the Last Year: Never true    Ran Out of Food in the Last Year: Never true  Transportation Needs: No Transportation Needs  (06/11/2023)   PRAPARE - Administrator, Civil Service (Medical): No    Lack of Transportation (Non-Medical): No  Physical Activity: Not on file  Stress: Not on file  Social Connections: Socially Integrated (06/11/2023)   Social Connection and Isolation Panel [NHANES]    Frequency of Communication with Friends and Family: Twice a week    Frequency of Social Gatherings with Friends and Family: Twice a week    Attends Religious Services: More than 4 times per year    Active Member of [employer] or Organizations: Yes    Attends Engineer, structural: More than 4 times per year    Marital Status: Married  Catering manager Violence: Not At Risk (06/11/2023)   Humiliation, Afraid, Rape, and Kick questionnaire    Fear of Current or Ex-Partner: No    Emotionally Abused: No    Physically Abused: No    Sexually Abused: No    Review of Systems: Unable to obtain due to altered mental status  Physical Exam: Vital signs in last 24 hours: Temp:  [98 F (36.7 C)-98.9 F (37.2 C)] 98.1 F (36.7 C) (05/27 0800) Pulse Rate:  [77-122] 88 (05/27 0600) Resp:  [11-22] 12 (05/27 0600) BP: (106-141)/(46-87) 125/47 (05/27 0500) SpO2:  [89 %-100 %] 100 % (05/27 0600) Weight:  [109.4 kg] 109.4 kg (05/27 0435) Last BM Date : 06/19/23 General:   Somnolent, difficult to arouse, doesn't answer questions,  Head:  Normocephalic and atraumatic. Eyes:  Sclera clear, no icterus.   Conjunctiva pink. Ears:  Normal auditory acuity. Nose:  No deformity, discharge,  or lesions. NGT in place Mouth:  No deformity or lesions.  Oropharynx dry Neck:  Supple; no masses or thyromegaly. Lungs:  Tachypneic and tachycardic at reset Abdomen:  Abdominal distention with tympany, No masses, hepatosplenomegaly or hernias noted. No peritonitis     Msk:  Symmetrical without gross deformities. Normal posture. Pulses:  Normal pulses noted. Extremities:  Without clubbing or edema. Neurologic:  Somnolent, difficult to  arouse, doesn't follow instructions Skin:  Intact without significant lesions or rashes. Psych:  Alert and cooperative. Normal mood and affect.   Lab Results: Recent Labs    06/17/23 0304 06/18/23 0310 06/19/23 0405  WBC 7.3 10.2 8.2  HGB 9.4* 9.9* 9.2*  HCT 26.9* 30.0* 28.9*  PLT 158 198 223   BMET Recent Labs    06/17/23 0304 06/18/23 0310 06/19/23 0405  NA 135 132* 133*  K 3.7 3.2* 3.1*  CL 107 102 101  CO2 21* 19* 24  GLUCOSE 130* 193* 131*  BUN 30* 36* 44*  CREATININE 1.03 1.26* 1.22  CALCIUM  8.3* 8.4* 8.0*   LFT Recent Labs    06/19/23 0405  PROT 5.4*  ALBUMIN  2.7*  AST 17  ALT 14  ALKPHOS 68  BILITOT 1.6*   PT/INR No results for input(s): "LABPROT", "INR" in the last 72 hours.  Studies/Results: DG CHEST PORT 1 VIEW Result Date: 06/18/2023 CLINICAL DATA:  Status post PICC placement. EXAM: PORTABLE CHEST 1 VIEW COMPARISON:  06/14/2023 FINDINGS: New right upper extremity PICC tip at the SVC brachiocephalic confluence. No pneumothorax. Tip and side port of enteric tube below the diaphragm in the stomach. Stable heart size and mediastinal contours. Bandlike atelectasis at the left lung base. Slight improvement in right basilar atelectasis from prior. No pulmonary edema or large pleural effusion. Left costophrenic angle is excluded from the field of view. IMPRESSION: 1. New right upper extremity PICC tip at the SVC brachiocephalic confluence. No pneumothorax. 2. Bibasilar atelectasis, slightly improved on the right. Electronically Signed   By: Chadwick Colonel M.D.   On: 06/18/2023 14:21   DG Abd 1 View Result Date: 06/18/2023 CLINICAL DATA:  Nasogastric tube placement EXAM: ABDOMEN - 1 VIEW COMPARISON:  06/16/2023 FINDINGS: A nasogastric tube is been placed with tip and side-port in the gastric fundus. Dilated loops of small bowel up to 5.2 cm in diameter. Gaseous prominence of the colon. Postoperative findings in the thoracolumbar spine, right SI joint, and both  hips. Lap band noted with 1:30-7:30 clock face orientation on this frontal projection. Bibasilar bandlike densities favoring atelectasis. Lower thoracic spondylosis. IMPRESSION: 1. Nasogastric tube tip and side-port in the gastric fundus. 2. Dilated loops of small bowel up to 5.2 cm in diameter. Gaseous prominence of the colon. Appearance could be from ileus or bowel obstruction. 3. Bibasilar atelectasis. 4. Postoperative findings in the thoracolumbar spine, right SI joint, and both hips. Electronically Signed   By: Freida Jes M.D.   On: 06/18/2023 13:00   US  EKG SITE RITE Result Date: 06/18/2023 If Site Rite image not attached, placement could not be confirmed due to current cardiac rhythm.   Impression:   Abdominal small bowel distention post-hip surgery. Ileus versus obstruction. Prior history small bowel obstruction per family few years ago. Multiple comorbidities.  Plan:   Continue NGT. Based on abdominal xray, most of distention appears small bowel.   As such, at present time, I don't think rectal tube will be helpful. If medically stable enough, and aggressive medical care desired, I would do CT abd/pelvis to better rule out bowel obstruction. Correct potassium, mobilize as tolerated, minimize narcotics,  hydration as cardiac status tolerates. Case discussed with primary team. [facility] will follow.   LOS: 5 days   Tere Mcconaughey M  06/19/2023, 10:41 AM  Cell [phone] If no answer or after 5 PM call [phone]

## 2023-06-19 NOTE — Progress Notes (Signed)
 Carlos Dawson has several family members at bedside including his wife, 2 of his sons and his brother.  They are awaiting more information and are hopeful that the scan will give them something that can be resolved. They have support from their pastor who called to check on them.  No particular needs at this time, but please page if needs arise.

## 2023-06-19 NOTE — Progress Notes (Signed)
 PHARMACY - TOTAL PARENTERAL NUTRITION CONSULT NOTE   Indication: Prolonged ileus  Patient Measurements: Height: 5' 5.5" (166.4 cm) Weight: 109.4 kg (241 lb 2.9 oz) IBW/kg (Calculated) : 62.65 TPN AdjBW (KG): 75.6 Body mass index is 39.52 kg/m. Usual Weight: 235lb on 05/29/23   Assessment:  83 YO male admitted by the orthopedic service on 5/19 after L hip total arthroplasty. Continued admission given concern for hypotension, tachycardia, and dyspnea with exertion (cardiology following). Per 5/24 KUB, likely ileus. Per MD, patient has not had adequate nutrition x3 days post-op. Pharmacy consulted for TPN management.  Glucose / Insulin : Hx of T2DM - CBGs 122-140 in last 24hrs - 6 units of insulin  used in the last 24hrs - D10 infusion @40ml /hr ran 5/26 from 1610-9604 (was stopped due to concerns for volume overload with HF) Electrolytes: Na 133 (slightly low), K 3.1 (low), all others WNL including CoCa at 9.0 Renal: Scr 1.22 (BL 1-1.10), BUN 44 (elevated) Hepatic: albumin  2.7 (low), Tbili 1.6 (slightly elevated), LFTs and alk phos WNL Intake / Output; MIVF:  PO: not recorded 5/26, not eating much PO per family UOP: -400 mL/24 hrs Emesis/NG output: -1500 mL/24 hrs  GI Imaging: - 5/24 DG abd: Diffuse gaseous distention of the colon most consistent with ileus. No evidence of small-bowel obstruction. - 5/26 DG abd: Dilated loops of small bowel up to 5.2 cm in diameter. Gaseous prominence of the colon. Appearance could be from ileus or bowel obstruction. GI Surgeries / Procedures: N/A  Central access: 5/26 TPN start date: 5/27  Nutritional Goals: Goal TPN rate is 80 mL/hr (provides 99.8 g of protein and 2047 kcals per day)  RD Assessment: Estimated Needs Total Energy Estimated Needs: 1900-2100 kcals Total Protein Estimated Needs: 95-115 grams Total Fluid Estimated Needs: >/= 1.9L - Pharmacy targeting low end of fluid requirement (1920 mL/day) given patient's history of HF and  significant volume overload post-op  Current Nutrition:  NPO and TPN NGT placed 5/26  Plan:  Now: Patient receiving IV Kcl 10mEq x5  At 1800:  Start TPN at 40mL/hr Electrolytes in TPN:  Na 71mEq/L K 50mEq/L Ca 52mEq/L Mg 54mEq/L Phos 15mmol/L Cl:Ac 1:1 Add standard MVI and trace elements to TPN Continue Sensitive q4h SSI and adjust as needed  Monitor TPN labs on Mon/Thurs, and PRN Awaiting RD assessment as to whether thiamine  is needed. Will order if necessary.    Roselee Cong, PharmD Clinical Pharmacist  5/27/20257:33 AM

## 2023-06-19 NOTE — Progress Notes (Addendum)
 PROGRESS NOTE    Patient: Carlos Dawson                            PCP: Tena Feeling, MD                    DOB: 01/17/1941            DOA: 06/11/2023 BJY:[id number]             DOS: 06/19/2023, 4:51 PM   LOS: 5 days   Date of Service: The patient was seen and examined on 06/19/2023  Subjective:   The patient was seen and examined this morning, somnolent, drowsy, arousable following some commands. Hemodynamically stable, HR 88, BP 124/47, satting 100% on 2 L of oxygen   Per nursing staff watery stool-overnight - no Solid BM, no bowel sounds or gas NG tube was placed PICC line was placed-anticipating initiation of TPN   Brief Narrative:   Carlos Dawson is a 83 y.o. male with medical history significant for CAD, diastolic CHF, COPD on room air, history of DVT, paroxysmal atrial fibrillation status post Watchman procedure, no longer on anticoagulation who was admitted to the hospital by the orthopedic service on 5/19 after successful left hip total arthroplasty who is now being seen in consultation due to concern for hypotension, tachycardia and dyspnea with exertion.   06/11/2023 S/p Total hip arthroplasty - Postop day # 8 06/15/2023 -developed tachyarrhythmia, hypotension, anemia 06/15/2023 -started on amiodarone drip overnight, IVF- 1 u PRBC  06/16/2023 -cardiology D/Ced amiodarone gtt., started p.o. metoprolol  06/17/2023 - developed ileus, severe abdominal distention, poor p.o. intake, 06/18/2023  - NG tube was placed, PICC line  06/19/2023 - Initiating TPN   -CT abdomen pelvis with contrast ordered - pending final results Consults: Ortho/cardiology/GI    Assessment & Plan:   Principal Problem:   Ileus (HCC) Active Problems:   OA (osteoarthritis) of hip   Hypotension   Status post hip replacement   NSVT (nonsustained ventricular tachycardia) (HCC)   Primary osteoarthritis of left hip   Paroxysmal atrial fibrillation (HCC)   SOB (shortness of breath)   Acute blood loss  anemia  Ileus Postop ileus -Progressive abdominal distention, watery BMs, no solid stool Likely from laxative - hypoactive bowel sounds  - NG tube in place - KUB consistent with ileus  -  NPO  - NG tube was placed 06/18/2023 - Initiating TPN today 06/19/2023  - Likely postop ileus due to reduced mobility   - Continuing bowel regimen, replating electrolytes  - Patient is progressively getting weak, somnolent unable to ambulate-contributing to ileus  - Surgery was contacted, discussed the case, no surgical invention at this point.  - Consulting GI Dr. Kimble Pennant for further evaluation and recommendations  - Repeat KUB today  -CT abdomen pelvis with contrast ordered - pending final results    Tachycardia, with history of A-fib -Heart rate has stabilized,  currently 88 -Unable to tolerate p.o. due to ileus.  Changed p.o. metoprolol  to IV  (n.p.o.) - Per cardiology as needed IV amiodarone and will consider IV amiodarone   Post hip arthroplasty patient was noted to be hypotensive, tachycardic thought to be in A-fib with RVR -History of paroxysmal A-fib s/p ablation May 11, 2017, Watchman device May 2024  - 06/15/2023 -started on amiodarone drip overnight, IV fluids-subsequently was discontinued - Restarted PO Metoprolol  -now cannot tolerate p.o. cardiology switched it to IV - Electrolytes potassium magnesium  -  replacing IV    Acute hypoxic respiratory failure -Requiring 2 L of oxygen , satting 92% - Satting 87% on room air, continue O2 supplements,  - Shortness of breath, hypoxia exacerbated by volume overload, ileus, abdominal distention - Bronchodilators as needed   Hypotensive -postop (h/o HTN) Blood pressure stabilized -Holding antihypertensive medications,    S/P Total hip arthroplasty Postop day # 8 -Remains stable-pending PT evaluation today - Orthopedic team following, - Pain management per Ortho - Postop ileus, NG tube in place n.p.o.    H/o CAD, -Denies any  chest pain - With history of cardiac cath, stenting in 2012, -Home medication: Metoprolol , aspirin , intolerant to statins  HFpEF - ?  Pulmonary hypertension - Last echo 11/12/22 ejection fraction 60-65%, 1 diastolic dysfunction - Cardiology mildly volume overloaded, will need to be diuresed, with blood pressures remain soft - Cardiology continuing IV Lasix   - Will monitor I's and O's  Intake/Output Summary (Last 24 hours) at 06/19/2023 1651 Last data filed at 06/19/2023 1122 Gross per 24 hour  Intake 484.3 ml  Output 1050 ml  Net -565.7 ml      Acute on chronic anemia -exacerbated postop and with IVF - Cardiology recommendation hemoglobin needs to be >8 Which also may have been contributed to hypotension, and tachycardia    Latest Ref Rng & Units 06/19/2023    4:05 AM 06/18/2023    3:10 AM 06/17/2023    3:04 AM  CBC  WBC 4.0 - 10.5 K/uL 8.2  10.2  7.3   Hemoglobin 13.0 - 17.0 g/dL 9.2  9.9  9.4   Hematocrit 39.0 - 52.0 % 28.9  30.0  26.9   Platelets 150 - 400 K/uL 223  198  158   - Hemoglobin improved along with the tachycardia and attention 06/15/23 s/p 1 unit PRBC blood transfusion    History of thoracic aortic aneurysm - 4.9 cm on last CT, continue follows outpatient  History of PE - Currently on Xarelto  - If unable to give Xarelto  via NG tube, will switch to heparin  drip     nutritional status:  The patient's BMI is: Body mass index is 39.52 kg/m. I agree with the assessment and plan as outlined  Skin Assessment: I have examined the patient's skin and I agree with the wound assessment as performed by wound care team As outlined belowe: Pressure Injury 06/14/23 Buttocks Mid Deep Tissue Pressure Injury - Purple or maroon localized area of discolored intact skin or blood-filled blister due to damage of underlying soft tissue from pressure and/or shear. Area of non-blanching purple skin on (Active)  06/14/23 1830  Location: Buttocks  Location Orientation: Mid   Staging: Deep Tissue Pressure Injury - Purple or maroon localized area of discolored intact skin or blood-filled blister due to damage of underlying soft tissue from pressure and/or shear.  Wound Description (Comments): Area of non-blanching purple skin on buttocks  Present on Admission: Yes  Dressing Type Foam - Lift dressing to assess site every shift 06/19/23 0800    ---------------------------------------------------------------------------------------------------------------------  DVT prophylaxis:  rivaroxaban  (XARELTO ) tablet 10 mg Start: 06/20/23 0800 Place TED hose Start: 06/11/23 1651 SCDs Start: 06/11/23 1433 rivaroxaban  (XARELTO ) tablet 10 mg   Code Status:   Code Status: Full Code  Family Communication: Patient's son and wife present at bedside - updated 06/19/23 Requested aggressive care including NG tube placement and TPN  -Advance care planning has been discussed.   Admission status:   Status is: Inpatient Remains inpatient appropriate because: Needing close monitoring  in critical care, with A-fib, anemia, needing diuretics, blood transfusion   Disposition: From  - home             Planning for discharge in >3  days: to Home  w HH   Procedures:   PR ARTHRP ACETBLR/PROX FEM PROSTC AGRFT/ALGRFT [27130]   Antimicrobials:  Anti-infectives (From admission, onward)    Start     Dose/Rate Route Frequency Ordered Stop   06/11/23 1830  ceFAZolin  (ANCEF ) IVPB 2g/100 mL premix        2 g 200 mL/hr over 30 Minutes Intravenous Every 6 hours 06/11/23 1651 06/12/23 0747   06/11/23 1030  ceFAZolin  (ANCEF ) IVPB 2g/100 mL premix        2 g 200 mL/hr over 30 Minutes Intravenous On call to O.R. 06/11/23 1025 06/11/23 1300        Medication:   aspirin   300 mg Rectal Daily   Chlorhexidine  Gluconate Cloth  6 each Topical Daily   furosemide   40 mg Intravenous Daily   insulin  aspart  0-9 Units Subcutaneous Q4H   metoprolol  tartrate  5 mg Intravenous Q6H   pantoprazole   (PROTONIX ) IV  40 mg Intravenous Q24H   [START ON 06/20/2023] rivaroxaban   10 mg Per Tube Q breakfast   sennosides  10 mL Per Tube BID   sodium chloride  flush  10-40 mL Intracatheter Q12H   tamsulosin   0.4 mg Oral QPM   thiamine (VITAMIN B1) injection  100 mg Intravenous Daily    acetaminophen , albuterol , alum & mag hydroxide-simeth, budesonide -formoterol , cyclobenzaprine , HYDROcodone -acetaminophen , HYDROcodone -acetaminophen , HYDROmorphone  (DILAUDID ) injection, lip balm, magnesium  citrate, menthol -cetylpyridinium **OR** phenol, metoCLOPramide  **OR** metoCLOPramide  (REGLAN ) injection, nitroGLYCERIN , ondansetron  **OR** ondansetron  (ZOFRAN ) IV, mouth rinse, polyethylene glycol, simethicone , sodium chloride , sodium chloride  flush, tiotropium   Objective:   Vitals:   06/19/23 1200 06/19/23 1300 06/19/23 1400 06/19/23 1500  BP: 113/65 106/63 109/79 139/72  Pulse: 78 (!) 104 (!) 110 98  Resp: 16 12 15 15   Temp: 98.4 F (36.9 C)     TempSrc: Axillary     SpO2: 95% 92% (!) 89% 94%  Weight:      Height:        Intake/Output Summary (Last 24 hours) at 06/19/2023 1651 Last data filed at 06/19/2023 1122 Gross per 24 hour  Intake 484.3 ml  Output 1050 ml  Net -565.7 ml   Filed Weights   06/17/23 0145 06/18/23 0635 06/19/23 0435  Weight: 116 kg 114.2 kg 109.4 kg     Physical examination:        General:  AAO x 2, somnolent, but arousable  HEENT:  Normocephalic, PERRL, otherwise with in Normal limits   Neuro:  CNII-XII intact. , normal motor and sensation, reflexes intact   Lungs:   Clear to auscultation BL, Respirations unlabored,  No wheezes / crackles  Cardio:    S1/S2, RRR, No murmure, No Rubs or Gallops   Abdomen:  Distended abdomen, hypoactive bowel sounds  Muscular  skeletal:  Limited exam -global generalized weaknesses - in bed, able to move all 4 extremities,   2+ pulses,  symmetric, No pitting edema  Skin:  Dry, warm to touch, negative for any Rashes,  Wounds: Please  see nursing documentation  Pressure Injury 06/14/23 Buttocks Mid Deep Tissue Pressure Injury - Purple or maroon localized area of discolored intact skin or blood-filled blister due to damage of underlying soft tissue from pressure and/or shear. Area of non-blanching purple skin on (Active)  06/14/23 1830  Location: Buttocks  Location  Orientation: Mid  Staging: Deep Tissue Pressure Injury - Purple or maroon localized area of discolored intact skin or blood-filled blister due to damage of underlying soft tissue from pressure and/or shear.  Wound Description (Comments): Area of non-blanching purple skin on buttocks  Present on Admission: Yes  Dressing Type Foam - Lift dressing to assess site every shift 06/19/23 0800          ----------------------------------------------------------------------------------------------------------------------    LABs:     Latest Ref Rng & Units 06/19/2023    4:05 AM 06/18/2023    3:10 AM 06/17/2023    3:04 AM  CBC  WBC 4.0 - 10.5 K/uL 8.2  10.2  7.3   Hemoglobin 13.0 - 17.0 g/dL 9.2  9.9  9.4   Hematocrit 39.0 - 52.0 % 28.9  30.0  26.9   Platelets 150 - 400 K/uL 223  198  158       Latest Ref Rng & Units 06/19/2023    4:05 AM 06/18/2023    3:10 AM 06/17/2023    3:04 AM  CMP  Glucose 70 - 99 mg/dL 161  096  045   BUN 8 - 23 mg/dL 44  36  30   Creatinine 0.61 - 1.24 mg/dL 4.09  8.11  9.14   Sodium 135 - 145 mmol/L 133  132  135   Potassium 3.5 - 5.1 mmol/L 3.1  3.2  3.7   Chloride 98 - 111 mmol/L 101  102  107   CO2 22 - 32 mmol/L 24  19  21    Calcium  8.9 - 10.3 mg/dL 8.0  8.4  8.3   Total Protein 6.5 - 8.1 g/dL 5.4     Total Bilirubin 0.0 - 1.2 mg/dL 1.6     Alkaline Phos 38 - 126 U/L 68     AST 15 - 41 U/L 17     ALT 0 - 44 U/L 14          Micro Results Recent Results (from the past 240 hours)  MRSA Next Gen by PCR, Nasal     Status: None   Collection Time: 06/14/23  6:32 PM   Specimen: Nasal Mucosa; Nasal Swab  Result Value Ref  Range Status   MRSA by PCR Next Gen NOT DETECTED NOT DETECTED Final    Comment: (NOTE) The GeneXpert MRSA Assay (FDA approved for NASAL specimens only), is one component of a comprehensive MRSA colonization surveillance program. It is not intended to diagnose MRSA infection nor to guide or monitor treatment for MRSA infections. Test performance is not FDA approved in patients less than 35 years old. Performed at [facility], [address] [place], [place] [address]     Radiology Reports DG Abd 1 View Result Date: 06/19/2023 CLINICAL DATA:  Ileus. EXAM: ABDOMEN - 1 VIEW COMPARISON:  Abdominal radiograph dated 06/18/2023. FINDINGS: Enteric tube with tip in the upper abdomen likely in the proximal stomach. Persistent diffuse small bowel dilatation. No acute osseous pathology. IMPRESSION: 1. Enteric tube with tip in the proximal stomach. 2. Persistent diffuse small bowel dilatation. Electronically Signed   By: Angus Bark M.D.   On: 06/19/2023 11:40      SIGNED: Bobbetta Burnet, MD, FHM. FAAFP. Arlin Benes - [facility] Critical care time spent - 75 min.  In seeing, evaluating and examining the patient. Reviewing medical records, labs, drawn plan of care. [facility],  Pager (please use [email or web address] to page/ text) Please use Epic Secure Chat for non-urgent  communication (7AM-7PM)  If 7PM-7AM, please contact night-coverage [email or web address], 06/19/2023, 4:51 PM

## 2023-06-20 ENCOUNTER — Inpatient Hospital Stay (HOSPITAL_COMMUNITY)

## 2023-06-20 DIAGNOSIS — I480 Paroxysmal atrial fibrillation: Secondary | ICD-10-CM | POA: Diagnosis not present

## 2023-06-20 DIAGNOSIS — I5033 Acute on chronic diastolic (congestive) heart failure: Secondary | ICD-10-CM

## 2023-06-20 DIAGNOSIS — R410 Disorientation, unspecified: Secondary | ICD-10-CM | POA: Diagnosis not present

## 2023-06-20 DIAGNOSIS — D539 Nutritional anemia, unspecified: Secondary | ICD-10-CM | POA: Diagnosis not present

## 2023-06-20 DIAGNOSIS — K567 Ileus, unspecified: Secondary | ICD-10-CM | POA: Diagnosis not present

## 2023-06-20 DIAGNOSIS — Z7901 Long term (current) use of anticoagulants: Secondary | ICD-10-CM

## 2023-06-20 LAB — GLUCOSE, CAPILLARY
Glucose-Capillary: 153 mg/dL — ABNORMAL HIGH (ref 70–99)
Glucose-Capillary: 156 mg/dL — ABNORMAL HIGH (ref 70–99)
Glucose-Capillary: 160 mg/dL — ABNORMAL HIGH (ref 70–99)
Glucose-Capillary: 162 mg/dL — ABNORMAL HIGH (ref 70–99)
Glucose-Capillary: 166 mg/dL — ABNORMAL HIGH (ref 70–99)
Glucose-Capillary: 203 mg/dL — ABNORMAL HIGH (ref 70–99)
Glucose-Capillary: 217 mg/dL — ABNORMAL HIGH (ref 70–99)

## 2023-06-20 LAB — COMPREHENSIVE METABOLIC PANEL WITH GFR
ALT: 13 U/L (ref 0–44)
AST: 15 U/L (ref 15–41)
Albumin: 3 g/dL — ABNORMAL LOW (ref 3.5–5.0)
Alkaline Phosphatase: 75 U/L (ref 38–126)
Anion gap: 9 (ref 5–15)
BUN: 52 mg/dL — ABNORMAL HIGH (ref 8–23)
CO2: 25 mmol/L (ref 22–32)
Calcium: 8.5 mg/dL — ABNORMAL LOW (ref 8.9–10.3)
Chloride: 103 mmol/L (ref 98–111)
Creatinine, Ser: 1.26 mg/dL — ABNORMAL HIGH (ref 0.61–1.24)
GFR, Estimated: 57 mL/min — ABNORMAL LOW (ref >60)
Glucose, Bld: 182 mg/dL — ABNORMAL HIGH (ref 70–99)
Potassium: 3.1 mmol/L — ABNORMAL LOW (ref 3.5–5.1)
Sodium: 137 mmol/L (ref 135–145)
Total Bilirubin: 1.5 mg/dL — ABNORMAL HIGH (ref 0.0–1.2)
Total Protein: 5.6 g/dL — ABNORMAL LOW (ref 6.5–8.1)

## 2023-06-20 LAB — CBC
HCT: 28.4 % — ABNORMAL LOW (ref 39.0–52.0)
Hemoglobin: 9.4 g/dL — ABNORMAL LOW (ref 13.0–17.0)
MCH: 35.2 pg — ABNORMAL HIGH (ref 26.0–34.0)
MCHC: 33.1 g/dL (ref 30.0–36.0)
MCV: 106.4 fL — ABNORMAL HIGH (ref 80.0–100.0)
Platelets: 255 10*3/uL (ref 150–400)
RBC: 2.67 MIL/uL — ABNORMAL LOW (ref 4.22–5.81)
RDW: 16.9 % — ABNORMAL HIGH (ref 11.5–15.5)
WBC: 4.4 10*3/uL (ref 4.0–10.5)
nRBC: 3 % — ABNORMAL HIGH (ref 0.0–0.2)

## 2023-06-20 LAB — PHOSPHORUS: Phosphorus: 2.7 mg/dL (ref 2.5–4.6)

## 2023-06-20 LAB — MAGNESIUM: Magnesium: 2.4 mg/dL (ref 1.7–2.4)

## 2023-06-20 MED ORDER — TRAVASOL 10 % IV SOLN
INTRAVENOUS | Status: AC
  Filled 2023-06-20: qty 998.4

## 2023-06-20 MED ORDER — DIATRIZOATE MEGLUMINE & SODIUM 66-10 % PO SOLN
90.0000 mL | Freq: Once | ORAL | Status: AC
  Administered 2023-06-20: 90 mL via NASOGASTRIC
  Filled 2023-06-20: qty 90

## 2023-06-20 MED ORDER — ACETAMINOPHEN 10 MG/ML IV SOLN
1000.0000 mg | Freq: Four times a day (QID) | INTRAVENOUS | Status: DC | PRN
  Administered 2023-06-20: 1000 mg via INTRAVENOUS
  Filled 2023-06-20: qty 100

## 2023-06-20 MED ORDER — ENOXAPARIN SODIUM 60 MG/0.6ML IJ SOSY
55.0000 mg | PREFILLED_SYRINGE | INTRAMUSCULAR | Status: DC
  Administered 2023-06-20 – 2023-06-22 (×3): 55 mg via SUBCUTANEOUS
  Filled 2023-06-20 (×3): qty 0.6

## 2023-06-20 MED ORDER — POTASSIUM CHLORIDE 10 MEQ/100ML IV SOLN
10.0000 meq | INTRAVENOUS | Status: AC
  Administered 2023-06-20 (×4): 10 meq via INTRAVENOUS
  Filled 2023-06-20 (×4): qty 100

## 2023-06-20 NOTE — Progress Notes (Signed)
 Subjective: 9 Days Post-Op Procedure(s) (LRB): ARTHROPLASTY, HIP, TOTAL, ANTERIOR APPROACH (Left) Patient seen in rounds by Dr. France Ina. Patient lethargic with continued concerns about GI issues. Mild pain in hip.  Objective: Vital signs in last 24 hours: Temp:  [97.7 F (36.5 C)-98.4 F (36.9 C)] 97.7 F (36.5 C) (05/28 0030) Pulse Rate:  [76-115] 83 (05/28 0600) Resp:  [12-30] 14 (05/28 0700) BP: (90-139)/(26-102) 109/75 (05/28 0700) SpO2:  [88 %-99 %] 99 % (05/28 0600) Weight:  [109.2 kg] 109.2 kg (05/28 0500)  Intake/Output from previous day:  Intake/Output Summary (Last 24 hours) at 06/20/2023 0735 Last data filed at 06/20/2023 0719 Gross per 24 hour  Intake 1070.41 ml  Output 2750 ml  Net -1679.59 ml    Intake/Output this shift: Total I/O In: 528.7 [I.V.:528.7] Out: -   Labs: Recent Labs    06/18/23 0310 06/19/23 0405 06/20/23 0454  HGB 9.9* 9.2* 9.4*   Recent Labs    06/19/23 0405 06/20/23 0454  WBC 8.2 4.4  RBC 2.72* 2.67*  HCT 28.9* 28.4*  PLT 223 255   Recent Labs    06/19/23 0405 06/20/23 0454  NA 133* 137  K 3.1* 3.1*  CL 101 103  CO2 24 25  BUN 44* 52*  CREATININE 1.22 1.26*  GLUCOSE 131* 182*  CALCIUM  8.0* 8.5*   No results for input(s): "LABPT", "INR" in the last 72 hours.  Exam: General - Patient is lethargic Extremity - Neurologically intact Neurovascular intact Sensation intact distally Dorsiflexion/Plantar flexion intact Dressing/Incision - clean, dry, no drainage Motor Function - intact, moving foot and toes well on exam.  Past Medical History:  Diagnosis Date   Anticoagulant long-term use    Aortic root enlargement (HCC)    Ascending aortic aneurysm (HCC)    recent scan in October 2012 showing no change; followed by Dr. Nicanor Barge   ASCVD (arteriosclerotic cardiovascular disease)    Prior BMS to the 2nd OM in September 2012; with repeat cath in October showing patency   CAD (coronary artery disease)    a. s/p BMS  to 2nd OM in Sept 2012; b. LexiScan  Myoview  (12/2012):  Inf infarct; bowel and motion artifact make study difficult to interpret; no ischemia; not gated; Low Risk   CHF (congestive heart failure) (HCC)    no recent issues 10/13/14   Chronic back pain    "all over my back" (05/11/2017)   Colonic polyp    Contact lens/glasses fitting    COPD (chronic obstructive pulmonary disease) (HCC)    Diastolic dysfunction    DVT (deep venous thrombosis) (HCC)    ?LLE   GERD (gastroesophageal reflux disease)    Hearing loss    Hearing loss    more so on left   Hemorrhoids    History of stomach ulcers    Hypertension    IBS (irritable bowel syndrome)    LVH (left ventricular hypertrophy)    OA (osteoarthritis)    "all over" (05/11/2017)   Obesities, morbid (HCC)    OSA (obstructive sleep apnea)    PSG 03/30/97 AHI 21, BPAP 13/9   OSA on CPAP    PAF (paroxysmal atrial fibrillation) (HCC)    a. on Xarelto  b. s/p DCCV in 08/2016; b. Tikosyn  failed 04/16/17 with plans for Multaq  and possible Afib ablation with Dr. Nunzio Belch   Pneumonia    'several times" (05/11/2017)   Presence of Watchman left atrial appendage closure device 06/15/2022   24mm Watchman FLX Pro placed by Dr. Marven Slimmer  Prostate CA [facility])    Oncologist  DR. Cristal [facility] dx 09/24/14, undetermined tx   prostate; S/P "radiation and hormone injections"   Pulmonary embolism (HCC) 2008   "both lungs"   SOB (shortness of breath)    on excertion   Thoracic aortic aneurysm (HCC)    Aortic Size Index=     5.0    /Body surface area is 2.43 meters squared. = 2.05  < 2.75 cm/m2      4% risk per year 2.75 to 4.25          8% risk per year > 4.25 cm/m2    20% risk per year   Stable aneurysmal dilation of the ascending aorta with maximum AP diameter of 4.8 cm. Stable area of narrowing of the proximal most portion of the descending aorta measuring 2 cm., previously identified as an area of coarctation. No evidence of aortic dissection.  Coronary artery  disease.  Normal appearance of the lungs.   Electronically Signed   By: Dobrinka  Dimitrova M.D.   On: 10/01/2014 08:50     Type II diabetes mellitus (HCC)    metphormin, average 154 dx 2017    Assessment/Plan: 9 Days Post-Op Procedure(s) (LRB): ARTHROPLASTY, HIP, TOTAL, ANTERIOR APPROACH (Left) Principal Problem:   Ileus (HCC) Active Problems:   OA (osteoarthritis) of hip   Paroxysmal atrial fibrillation (HCC)   Hypotension   SOB (shortness of breath)   Primary osteoarthritis of left hip   Status post hip replacement   NSVT (nonsustained ventricular tachycardia) (HCC)   Acute blood loss anemia  Estimated body mass index is 39.45 kg/m as calculated from the following:   Height as of this encounter: 5' 5.5" (1.664 m).   Weight as of this encounter: 109.2 kg.  DVT Prophylaxis - Xarelto  Weight-bearing as tolerated.  From ortho standpoint, hip is doing fine. Given other medical comorbidities, he will participate in physical therapy when appropriate.  R. Brinton Canavan, PA-C Orthopedic Surgery 06/20/2023, 7:35 AM

## 2023-06-20 NOTE — Progress Notes (Signed)
 Occupational Therapy Treatment Patient Details Name: Carlos Dawson MRN: [id number] DOB: 09-Oct-1940 Today's Date: 06/20/2023   History of present illness patient is a 83 y.o. male who was admitted to the hospital by the orthopedic service on 5/19 after successful left hip total arthroplasty who is now being seen in consultation due to concern for hypotension, tachycardia and dyspnea with exertion. Transfer to SDU 5/22 due to A Fib. PMH:CAD, diastolic CHF, COPD on room air, history of DVT, paroxysmal atrial fibrillation status post Watchman procedure, no longer on anticoagulation   OT comments  Patient was able to engage in toileting tasks on this date with reduced A for transfers still need +2 for safety with line movement. Patient was noted to have decreased functional activity tolerance, decreased endurance, decreased standing balance, decreased safety awareness, and decreased knowledge of AD/AE impacting participation in ADLs. Patient will benefit from continued inpatient follow up therapy, <3 hours/day.        If plan is discharge home, recommend the following:  Two people to help with walking and/or transfers;A lot of help with bathing/dressing/bathroom;Assistance with cooking/housework;Direct supervision/assist for medications management;Assist for transportation;Help with stairs or ramp for entrance;Direct supervision/assist for financial management   Equipment Recommendations  None recommended by OT       Precautions / Restrictions Precautions Precautions: Fall Precaution/Restrictions Comments: NPO/NG tube/TPN/PICC Restrictions Weight Bearing Restrictions Per Provider Order: No LLE Weight Bearing Per Provider Order: Weight bearing as tolerated       Mobility Bed Mobility Overal bed mobility: Needs Assistance Bed Mobility: Supine to Sit     Supine to sit: Mod assist, +2 for safety/equipment             Balance Overall balance assessment: History of Falls, Needs  assistance Sitting-balance support: Feet supported Sitting balance-Leahy Scale: Good       Standing balance-Leahy Scale: Poor                             ADL either performed or assessed with clinical judgement   ADL Overall ADL's : Needs assistance/impaired                         Toilet Transfer: Moderate assistance;Rolling walker (2 wheels);Stand-pivot Statistician Details (indicate cue type and reason): to [facility] with no buckling noted. transfererd from bed to [facility] then to recliner in room with increased time and cues for positioning. Toileting- Architect and Hygiene: Sit to/from stand;Total assistance                Cognition Arousal: Lethargic Behavior During Therapy: WFL for tasks assessed/performed Cognition: No apparent impairments             OT - Cognition Comments: motivated                 Following commands: Intact                      Pertinent Vitals/ Pain       Pain Assessment Pain Assessment: Faces Faces Pain Scale: Hurts little more Pain Location: L hip and back with movement Pain Descriptors / Indicators: Discomfort, Grimacing, Operative site guarding Pain Intervention(s): Limited activity within patient's tolerance, Monitored during session, Repositioned         Frequency  Min 2X/week        Progress Toward Goals  OT Goals(current goals can now be found in the  care plan section)  Progress towards OT goals: Progressing toward goals     Plan         AM-PAC OT "6 Clicks" Daily Activity     Outcome Measure   Help from another person eating meals?: A Little Help from another person taking care of personal grooming?: A Little Help from another person toileting, which includes using toliet, bedpan, or urinal?: Total Help from another person bathing (including washing, rinsing, drying)?: Total Help from another person to put on and taking off regular upper body clothing?: A  Little Help from another person to put on and taking off regular lower body clothing?: A Lot 6 Click Score: 13    End of Session Equipment Utilized During Treatment: Gait belt;Rolling walker (2 wheels);Oxygen   OT Visit Diagnosis: Unsteadiness on feet (R26.81);Other abnormalities of gait and mobility (R26.89);Muscle weakness (generalized) (M62.81);Pain   Activity Tolerance Patient tolerated treatment well   Patient Left in chair;with call bell/phone within reach;with chair alarm set   Nurse Communication Mobility status        Time: 2130-8657 OT Time Calculation (min): 31 min  Charges: OT General Charges $OT Visit: 1 Visit OT Treatments $Self Care/Home Management : 23-37 mins  Wynette Heckler, MS Acute Rehabilitation Department Office# [phone]   Jame Maze 06/20/2023, 2:25 PM

## 2023-06-20 NOTE — Consult Note (Signed)
 Carlos Dawson Dec 07, 1940  [id number].    Requesting MD: Sherrod Dolphin, MD Chief Complaint/Reason for Consult: ileus vs SBO  HPI:  Carlos Dawson is an 83 y/o M with multiple medical problems including a.fib, watchman device 06/15/22, CHF, CAD, COPD/asthma on home O2, OSA, prostate cancer, GERD, and obesity. He was admitted to the hospital for total hip arthroplasty by Dr. Rossie Coon on 5/19. His post-operative course has been complicated by tachyarrhythmia and hypotension being followed by cardiology. Diet was advanced as tolerated about around POD#4/5 the patient developed abdominal pain, distention, followed by nausea. KUB showed ileus. He had an NG tube placed and CT scan of the abdomen performed yesterday 5/27 shows ileus vs pSBO and general surgery has been asked to evaluate. GI has also been consulted for possible ileus.  The patient reports abdominal distention and RLQ abdominal discomfort. His son who is at the bedside says that his dad has been more somnolent for the last 3 days. Patient reports minimal flatus and very small, watery BMs. He reports a history of lap band by Dr. Gaylyn Keas as well as history of  cholecystectomy and appendectomy. He tells me that he was admitted for a bowel obstruction 6 years ago in [employer], [place] that resolved without surgery. States he quit smoking 30 years ago.   ROS: Review of Systems  All other systems reviewed and are negative.   Family History  Problem Relation Age of Onset   Heart disease Mother    Diabetes Mother    Other Mother        stent placement   Emphysema Father 23   Heart attack Sister     Past Medical History:  Diagnosis Date   Anticoagulant long-term use    Aortic root enlargement (HCC)    Ascending aortic aneurysm [facility])    recent scan in October 2012 showing no change; followed by Dr. Nicanor Barge   ASCVD (arteriosclerotic cardiovascular disease)    Prior BMS to the 2nd OM in September 2012; with repeat cath in October showing  patency   CAD (coronary artery disease)    a. s/p BMS to 2nd OM in Sept 2012; b. LexiScan  Myoview  (12/2012):  Inf infarct; bowel and motion artifact make study difficult to interpret; no ischemia; not gated; Low Risk   CHF (congestive heart failure) (HCC)    no recent issues 10/13/14   Chronic back pain    "all over my back" (05/11/2017)   Colonic polyp    Contact lens/glasses fitting    COPD (chronic obstructive pulmonary disease) (HCC)    Diastolic dysfunction    DVT (deep venous thrombosis) (HCC)    ?LLE   GERD (gastroesophageal reflux disease)    Hearing loss    Hearing loss    more so on left   Hemorrhoids    History of stomach ulcers    Hypertension    IBS (irritable bowel syndrome)    LVH (left ventricular hypertrophy)    OA (osteoarthritis)    "all over" (05/11/2017)   Obesities, morbid (HCC)    OSA (obstructive sleep apnea)    PSG 03/30/97 AHI 21, BPAP 13/9   OSA on CPAP    PAF (paroxysmal atrial fibrillation) (HCC)    a. on Xarelto  b. s/p DCCV in 08/2016; b. Tikosyn  failed 04/16/17 with plans for Multaq  and possible Afib ablation with Dr. Nunzio Belch   Pneumonia    'several times" (05/11/2017)   Presence of Watchman left atrial appendage closure device 06/15/2022  24mm Watchman FLX Pro placed by Dr. Marven Slimmer   Prostate [facility])    Oncologist  DR. Cristal [facility] 09/24/14, undetermined tx   prostate; S/P "radiation and hormone injections"   Pulmonary embolism (HCC) 2008   "both lungs"   SOB (shortness of breath)    on excertion   Thoracic aortic aneurysm (HCC)    Aortic Size Index=     5.0    /Body surface area is 2.43 meters squared. = 2.05  < 2.75 cm/m2      4% risk per year 2.75 to 4.25          8% risk per year > 4.25 cm/m2    20% risk per year   Stable aneurysmal dilation of the ascending aorta with maximum AP diameter of 4.8 cm. Stable area of narrowing of the proximal most portion of the descending aorta measuring 2 cm., previously identified as an area of  coarctation. No evidence of aortic dissection.  Coronary artery disease.  Normal appearance of the lungs.   Electronically Signed   By: Dobrinka  Dimitrova M.D.   On: 10/01/2014 08:50     Type II diabetes mellitus (HCC)    metphormin, average 154 dx 2017    Past Surgical History:  Procedure Laterality Date   ACHILLES TENDON REPAIR Bilateral    AORTIC ARCH ANGIOGRAPHY N/A 03/13/2017   Procedure: AORTIC ARCH ANGIOGRAPHY;  Surgeon: [name], [name], MD;  Location: [facility] INVASIVE CV LAB;  Service: Cardiovascular;  Laterality: N/A;   APPENDECTOMY     ATRIAL FIBRILLATION ABLATION  05/11/2017   ATRIAL FIBRILLATION ABLATION N/A 05/11/2017   Procedure: ATRIAL FIBRILLATION ABLATION;  Surgeon: Jolly Needle, MD;  Location: [facility] INVASIVE CV LAB;  Service: Cardiovascular;  Laterality: N/A;   BACK SURGERY     "I've had 7 back and 1 neck ORs" (05/11/2017)   BIOPSY  03/14/2018   Procedure: BIOPSY;  Surgeon: Genell Ken, MD;  Location: [facility] ENDOSCOPY;  Service: Gastroenterology;;  EGD and Colon   CARDIAC CATHETERIZATION  2006   CARPAL TUNNEL RELEASE Bilateral    LEFT   CATARACT EXTRACTION W/ INTRAOCULAR LENS  IMPLANT, BILATERAL Bilateral    CERVICAL SPINE SURGERY  06/02/2010   lower back and neck   COLONOSCOPY N/A 03/14/2018   Procedure: COLONOSCOPY;  Surgeon: Genell Ken, MD;  Location: [facility] ENDOSCOPY;  Service: Gastroenterology;  Laterality: N/A;   COLONOSCOPY WITH PROPOFOL  N/A 12/29/2014   Procedure: COLONOSCOPY WITH PROPOFOL ;  Surgeon: Garrett Kallman, MD;  Location: [facility] ENDOSCOPY;  Service: Endoscopy;  Laterality: N/A;   CORONARY ANGIOPLASTY WITH STENT PLACEMENT  October 2012   CORONARY STENT PLACEMENT  Sept 2012   2nd OM with BMS   ESOPHAGOGASTRODUODENOSCOPY N/A 03/14/2018   Procedure: ESOPHAGOGASTRODUODENOSCOPY (EGD);  Surgeon: Genell Ken, MD;  Location: [facility];  Service: Gastroenterology;  Laterality: N/A;   HEMORROIDECTOMY     LAMINECTOMY  05/30/2012   L 4 L5   LAMINECTOMY WITH POSTERIOR LATERAL  ARTHRODESIS LEVEL 3 N/A 10/18/2016   Procedure: Posterior Lateral Fusion - Lumbar One-Four, segmental instrumentation Lumbar One-Five,  decompression,;  Surgeon: Isadora Mar, MD;  Location: [facility] OR;  Service: Neurosurgery;  Laterality: N/A;   LAPAROSCOPIC CHOLECYSTECTOMY     LAPAROSCOPIC GASTRIC BANDING     LEFT AND RIGHT HEART CATHETERIZATION WITH CORONARY ANGIOGRAM N/A 05/07/2014   Procedure: LEFT AND RIGHT HEART CATHETERIZATION WITH CORONARY ANGIOGRAM;  Surgeon: [name] [name], MD;  Location: [facility] CATH LAB;  Service: Cardiovascular;  Laterality: N/A;   LEFT ATRIAL  APPENDAGE OCCLUSION N/A 06/15/2022   Procedure: LEFT ATRIAL APPENDAGE OCCLUSION;  Surgeon: Boyce Byes, MD;  Location: [facility] INVASIVE CV LAB;  Service: Cardiovascular;  Laterality: N/A;   LEFT HEART CATH AND CORONARY ANGIOGRAPHY N/A 03/13/2017   Procedure: LEFT HEART CATH AND CORONARY ANGIOGRAPHY;  Surgeon: [name], [name], MD;  Location: [facility] INVASIVE CV LAB;  Service: Cardiovascular;  Laterality: N/A;   LUMBAR LAMINECTOMY/DECOMPRESSION MICRODISCECTOMY N/A 05/04/2016   Procedure: Laminectomy and Foraminotomy - Thoracic twelve-Lumbar one -Posterior Fusion Lumbar one-two;  Surgeon: Isadora Mar, MD;  Location: [facility] OR;  Service: Neurosurgery;  Laterality: N/A;   POLYPECTOMY  03/14/2018   Procedure: POLYPECTOMY;  Surgeon: Genell Ken, MD;  Location: [facility];  Service: Gastroenterology;;   POSTERIOR LUMBAR FUSION  10/18/2016   SHOULDER OPEN ROTATOR CUFF REPAIR Bilateral    TEE WITHOUT CARDIOVERSION N/A 06/15/2022   Procedure: TRANSESOPHAGEAL ECHOCARDIOGRAM;  Surgeon: Boyce Byes, MD;  Location: [facility] INVASIVE CV LAB;  Service: Cardiovascular;  Laterality: N/A;   TONSILLECTOMY AND ADENOIDECTOMY     TOTAL HIP ARTHROPLASTY Right 10/18/2018   Procedure: RIGHT TOTAL HIP ARTHROPLASTY ANTERIOR APPROACH;  Surgeon: Arnie Lao, MD;  Location: [facility] ORS;  Service: Orthopedics;  Laterality: Right;   TOTAL HIP ARTHROPLASTY Left 06/11/2023    Procedure: ARTHROPLASTY, HIP, TOTAL, ANTERIOR APPROACH;  Surgeon: Liliane Rei, MD;  Location: [facility] ORS;  Service: Orthopedics;  Laterality: Left;   TRIGGER FINGER RELEASE     LEFT   UVULOPALATOPHARYNGOPLASTY     VASECTOMY      Social History:  reports that he quit smoking about 31 years ago. His smoking use included cigarettes. He started smoking about 67 years ago. He has a 54 pack-year smoking history. He has never used smokeless tobacco. He reports that he does not drink alcohol and does not use drugs.  Allergies:  Allergies  Allergen Reactions   Lotensin [Benazepril] Cough   Tofranil [Imipramine]     Weight gain   Ace Inhibitors Cough   Adhesive [Tape] Itching and Rash   Amoxicillin -Pot Clavulanate Other (See Comments)    GI Upset (intolerance)   Codeine Nausea Only   Latex Itching, Rash and Other (See Comments)    Bandaids   Metformin  Diarrhea   Morphine Itching   Nifedipine Swelling     leg edema   Quinolones Other (See Comments)    Patient was warned about not using Cipro and similar antibiotics. Recent studies have raised concern that fluoroquinolone antibiotics could be associated with an increased risk of aortic aneurysm Fluoroquinolones have non-antimicrobial properties that might jeopardise the integrity of the extracellular matrix of the vascular wall In a  propensity score matched cohort study in [place], there was a 66% increased rate of aortic aneurysm or dissection associated with oral fluoroquinolone use, compared wit    Medications Prior to Admission  Medication Sig Dispense Refill   acetaminophen  (TYLENOL ) 500 MG tablet Take 1,000 mg by mouth every 8 (eight) hours as needed for mild pain or headache.      albuterol  (VENTOLIN  HFA) 108 (90 Base) MCG/ACT inhaler Inhale 2 puffs into the lungs every 4 (four) hours as needed for wheezing or shortness of breath. 18 g 3   [EXPIRED] aspirin  EC 81 MG tablet Take 1 tablet (81 mg total) by mouth daily. Swallow whole.  360 tablet 0   budesonide -formoterol  (SYMBICORT ) 160-4.5 MCG/ACT inhaler Inhale 2 puffs into the lungs daily as needed (Asthma). (Patient taking differently: Inhale 2 puffs into the lungs 2 (two) times daily  as needed (Asthma).) 1 each 11   celecoxib  (CELEBREX ) 200 MG capsule Take 200 mg by mouth daily as needed for moderate pain (pain score 4-6).     cetirizine (ZYRTEC) 10 MG tablet Take 10 mg by mouth daily.     Cholecalciferol  (VITAMIN D3 MAXIMUM STRENGTH) 125 MCG (5000 UT) capsule Take 5,000 Units by mouth daily.     Ferrous Sulfate Dried (FEOSOL) 200 (65 Fe) MG TABS Take 1 tablet by mouth every other day.     furosemide  (LASIX ) 40 MG tablet TAKE ONE TABLET (40MG ) BY MOUTH EACH DAY AS NEEDED FOR SWELLING OR WEIGHT GAIN. (Patient taking differently: Take 40 mg by mouth daily. May take a second 40 mg dose as needed for swelling) 45 tablet 8   JARDIANCE  25 MG TABS tablet Take 25 mg by mouth daily.     losartan  (COZAAR ) 25 MG tablet Take 1 tablet (25 mg total) by mouth daily.     pantoprazole  (PROTONIX ) 40 MG tablet TAKE ONE TABLET BY MOUTH BEFORE BREAKFAST (TAKE ON AN EMPTY STOMACH 30 MINUTES PRIOR TO A MEAL)     potassium chloride  SA (KLOR-CON  M20) 20 MEQ tablet Taking two tablet by mouth in the am and 1 tablet in the evening 270 tablet 3   pregabalin (LYRICA) 50 MG capsule Take 50 mg by mouth at bedtime as needed (pain).     psyllium (METAMUCIL) 58.6 % packet Take 1 packet by mouth daily as needed (Looose stool).     spironolactone  (ALDACTONE ) 25 MG tablet TAKE 1 TABLET (25 MG TOTAL) BY MOUTH DAILY. 90 tablet 3   tamsulosin  (FLOMAX ) 0.4 MG CAPS Take 0.4 mg by mouth every evening.      Tiotropium Bromide  Monohydrate (SPIRIVA  RESPIMAT) 2.5 MCG/ACT AERS Inhale 2 puffs into the lungs daily as needed (shortness of breath).     topiramate  (TOPAMAX ) 25 MG capsule Take 50 mg by mouth 2 (two) times daily.      umeclidinium bromide  (INCRUSE ELLIPTA ) 62.5 MCG/ACT AEPB Inhale 1 puff into the lungs daily. 30  each 5   furosemide  (LASIX ) 80 MG tablet Take 0.5 tablets (40 mg total) by mouth daily. (Patient not taking: Reported on 05/22/2023)     ipratropium (ATROVENT ) 0.03 % nasal spray Place 2 sprays into both nostrils every 12 (twelve) hours. (Patient not taking: Reported on 05/22/2023) 30 mL 3   nitroGLYCERIN  (NITROSTAT ) 0.4 MG SL tablet Place 1 tablet (0.4 mg total) under the tongue every 5 (five) minutes as needed for chest pain. 25 tablet 5   Polyethyl Glycol-Propyl Glycol (SYSTANE OP) Place 1 drop into both eyes daily as needed (for dry eyes).      sildenafil  (VIAGRA ) 100 MG tablet Take 1 tablet (100 mg total) by mouth as needed for erectile dysfunction. 10 tablet 6   Skin Protectants, Misc. (EUCERIN) cream Apply 1 application topically as needed for dry skin.       Physical Exam: Blood pressure 117/78, pulse 83, temperature 97.7 F (36.5 C), temperature source Axillary, resp. rate 13, height 5' 5.5" (1.664 m), weight 109.2 kg, SpO2 99%. General: Pleasant white male, appears chronically ill, laying on hospital bed, appears stated age, NAD. HEENT: head -normocephalic, atraumatic; Eyes: PERRLA, no conjunctival injection Neck- Trachea is midline CV- RRR, normal S1/S2, no M/R/G Pulm- breathing is non-labored on [place]  Abd- soft, protuberant and tympanic, overal non-tender, mild subjective tenderness in RLQ  NG with thick, yellow bilious effluent GU- deferred  MSK- upper extremities are symmetrical with scattered ecchymosis  Neuro- non-focal exam Psych- Alert and Oriented x3 with appropriate affect Skin: warm and dry   Results for orders placed or performed during the hospital encounter of 06/11/23 (from the past 48 hours)  Glucose, capillary     Status: Abnormal   Collection Time: 06/18/23 11:47 AM  Result Value Ref Range   Glucose-Capillary 140 (H) 70 - 99 mg/dL    Comment: Glucose reference range applies only to samples taken after fasting for at least 8 hours.   Comment 1 Notify RN     Comment 2 Document in Chart   Glucose, capillary     Status: Abnormal   Collection Time: 06/18/23  4:03 PM  Result Value Ref Range   Glucose-Capillary 134 (H) 70 - 99 mg/dL    Comment: Glucose reference range applies only to samples taken after fasting for at least 8 hours.   Comment 1 Notify RN    Comment 2 Document in Chart   Glucose, capillary     Status: Abnormal   Collection Time: 06/18/23  8:53 PM  Result Value Ref Range   Glucose-Capillary 139 (H) 70 - 99 mg/dL    Comment: Glucose reference range applies only to samples taken after fasting for at least 8 hours.   Comment 1 Document in Chart   Glucose, capillary     Status: Abnormal   Collection Time: 06/19/23 12:33 AM  Result Value Ref Range   Glucose-Capillary 125 (H) 70 - 99 mg/dL    Comment: Glucose reference range applies only to samples taken after fasting for at least 8 hours.   Comment 1 Document in Chart   Glucose, capillary     Status: Abnormal   Collection Time: 06/19/23  4:04 AM  Result Value Ref Range   Glucose-Capillary 123 (H) 70 - 99 mg/dL    Comment: Glucose reference range applies only to samples taken after fasting for at least 8 hours.   Comment 1 Document in Chart   CBC     Status: Abnormal   Collection Time: 06/19/23  4:05 AM  Result Value Ref Range   WBC 8.2 4.0 - 10.5 K/uL   RBC 2.72 (L) 4.22 - 5.81 MIL/uL   Hemoglobin 9.2 (L) 13.0 - 17.0 g/dL   HCT 65.7 (L) 84.6 - 96.2 %   MCV 106.3 (H) 80.0 - 100.0 fL   MCH 33.8 26.0 - 34.0 pg   MCHC 31.8 30.0 - 36.0 g/dL   RDW 95.2 (H) 84.1 - 32.4 %   Platelets 223 150 - 400 K/uL   nRBC 1.0 (H) 0.0 - 0.2 %    Comment: Performed at [facility], [address] [place], [place] [address]  Magnesium      Status: None   Collection Time: 06/19/23  4:05 AM  Result Value Ref Range   Magnesium  2.1 1.7 - 2.4 mg/dL    Comment: Performed at [facility], [address] [place], [place] [address]  Comprehensive metabolic panel      Status: Abnormal   Collection Time: 06/19/23  4:05 AM  Result Value Ref Range   Sodium 133 (L) 135 - 145 mmol/L   Potassium 3.1 (L) 3.5 - 5.1 mmol/L   Chloride 101 98 - 111 mmol/L   CO2 24 22 - 32 mmol/L   Glucose, Bld 131 (H) 70 - 99 mg/dL    Comment: Glucose reference range applies only to samples taken after fasting for at least 8 hours.   BUN 44 (H) 8 -  23 mg/dL   Creatinine, Ser 4.40 0.61 - 1.24 mg/dL   Calcium  8.0 (L) 8.9 - 10.3 mg/dL   Total Protein 5.4 (L) 6.5 - 8.1 g/dL   Albumin  2.7 (L) 3.5 - 5.0 g/dL   AST 17 15 - 41 U/L   ALT 14 0 - 44 U/L   Alkaline Phosphatase 68 38 - 126 U/L   Total Bilirubin 1.6 (H) 0.0 - 1.2 mg/dL   GFR, Estimated 60 (L) >60 mL/min    Comment: (NOTE) Calculated using the CKD-EPI Creatinine Equation (2021)    Anion gap 8 5 - 15    Comment: Performed at [facility], [address] [place], [place] [address]  Phosphorus     Status: None   Collection Time: 06/19/23  4:05 AM  Result Value Ref Range   Phosphorus 3.2 2.5 - 4.6 mg/dL    Comment: Performed at [facility], [address], [place], [place] [address]  Glucose, capillary     Status: Abnormal   Collection Time: 06/19/23  7:34 AM  Result Value Ref Range   Glucose-Capillary 122 (H) 70 - 99 mg/dL    Comment: Glucose reference range applies only to samples taken after fasting for at least 8 hours.   Comment 1 Notify RN    Comment 2 Document in Chart   Glucose, capillary     Status: Abnormal   Collection Time: 06/19/23 12:02 PM  Result Value Ref Range   Glucose-Capillary 122 (H) 70 - 99 mg/dL    Comment: Glucose reference range applies only to samples taken after fasting for at least 8 hours.   Comment 1 Notify RN    Comment 2 Document in Chart   Urinalysis, Routine w reflex microscopic -Urine, Clean Catch     Status: Abnormal   Collection Time: 06/19/23  3:01 PM  Result Value Ref Range   Color, Urine YELLOW YELLOW   APPearance HAZY (A) CLEAR    Specific Gravity, Urine 1.017 1.005 - 1.030   pH 5.0 5.0 - 8.0   Glucose, UA >=500 (A) NEGATIVE mg/dL   Hgb urine dipstick NEGATIVE NEGATIVE   Bilirubin Urine NEGATIVE NEGATIVE   Ketones, ur NEGATIVE NEGATIVE mg/dL   Protein, ur NEGATIVE NEGATIVE mg/dL   Nitrite NEGATIVE NEGATIVE   Leukocytes,Ua NEGATIVE NEGATIVE   RBC / HPF 0-5 0 - 5 RBC/hpf   WBC, UA 0-5 0 - 5 WBC/hpf   Bacteria, UA NONE SEEN NONE SEEN   Squamous Epithelial / HPF 0-5 0 - 5 /HPF   Mucus PRESENT    Hyaline Casts, UA PRESENT     Comment: Performed at [facility], [address], [place], [place] [address]  TSH     Status: None   Collection Time: 06/19/23  3:01 PM  Result Value Ref Range   TSH 1.800 0.350 - 4.500 uIU/mL    Comment: Performed by a 3rd Generation assay with a functional sensitivity of <=0.01 uIU/mL. Performed at [facility], [address], [place], [place] [address]   Glucose, capillary     Status: Abnormal   Collection Time: 06/19/23  4:29 PM  Result Value Ref Range   Glucose-Capillary 130 (H) 70 - 99 mg/dL    Comment: Glucose reference range applies only to samples taken after fasting for at least 8 hours.   Comment 1 Notify RN    Comment 2 Document in Chart   Ammonia     Status: Abnormal   Collection  Time: 06/19/23  4:43 PM  Result Value Ref Range   Ammonia 51 (H) 9 - 35 umol/L    Comment: Performed at [facility], [address], [place], [place] [address]  Brain natriuretic peptide     Status: Abnormal   Collection Time: 06/19/23  5:42 PM  Result Value Ref Range   B Natriuretic Peptide 300.9 (H) 0.0 - 100.0 pg/mL    Comment: Performed at [facility], [address], [place], [place] [address]  Glucose, capillary     Status: Abnormal   Collection Time: 06/19/23  8:34 PM  Result Value Ref Range   Glucose-Capillary 151 (H) 70 - 99 mg/dL    Comment: Glucose reference range applies only to samples taken after  fasting for at least 8 hours.   Comment 1 Notify RN    Comment 2 Document in Chart   Glucose, capillary     Status: Abnormal   Collection Time: 06/20/23 12:33 AM  Result Value Ref Range   Glucose-Capillary 162 (H) 70 - 99 mg/dL    Comment: Glucose reference range applies only to samples taken after fasting for at least 8 hours.  Comprehensive metabolic panel     Status: Abnormal   Collection Time: 06/20/23  4:54 AM  Result Value Ref Range   Sodium 137 135 - 145 mmol/L   Potassium 3.1 (L) 3.5 - 5.1 mmol/L   Chloride 103 98 - 111 mmol/L   CO2 25 22 - 32 mmol/L   Glucose, Bld 182 (H) 70 - 99 mg/dL    Comment: Glucose reference range applies only to samples taken after fasting for at least 8 hours.   BUN 52 (H) 8 - 23 mg/dL   Creatinine, Ser 4.78 (H) 0.61 - 1.24 mg/dL   Calcium  8.5 (L) 8.9 - 10.3 mg/dL   Total Protein 5.6 (L) 6.5 - 8.1 g/dL   Albumin  3.0 (L) 3.5 - 5.0 g/dL   AST 15 15 - 41 U/L   ALT 13 0 - 44 U/L   Alkaline Phosphatase 75 38 - 126 U/L   Total Bilirubin 1.5 (H) 0.0 - 1.2 mg/dL   GFR, Estimated 57 (L) >60 mL/min    Comment: (NOTE) Calculated using the CKD-EPI Creatinine Equation (2021)    Anion gap 9 5 - 15    Comment: Performed at [facility], [address] [place], [place] [address]  Magnesium      Status: None   Collection Time: 06/20/23  4:54 AM  Result Value Ref Range   Magnesium  2.4 1.7 - 2.4 mg/dL    Comment: Performed at [facility], [address] [place], [place] [address]  Phosphorus     Status: None   Collection Time: 06/20/23  4:54 AM  Result Value Ref Range   Phosphorus 2.7 2.5 - 4.6 mg/dL    Comment: Performed at [facility], [address], [place], [place] [address]  CBC     Status: Abnormal   Collection Time: 06/20/23  4:54 AM  Result Value Ref Range   WBC 4.4 4.0 - 10.5 K/uL   RBC 2.67 (L) 4.22 - 5.81 MIL/uL   Hemoglobin 9.4 (L) 13.0 - 17.0 g/dL   HCT 96.2 (L) 95.2 - 84.1 %    MCV 106.4 (H) 80.0 - 100.0 fL   MCH 35.2 (H) 26.0 - 34.0 pg   MCHC 33.1 30.0 - 36.0 g/dL   RDW 32.4 (H) 40.1 - 02.7 %  Platelets 255 150 - 400 K/uL   nRBC 3.0 (H) 0.0 - 0.2 %    Comment: Performed at [facility], [address], [place], [place] [address]  Glucose, capillary     Status: Abnormal   Collection Time: 06/20/23  4:59 AM  Result Value Ref Range   Glucose-Capillary 166 (H) 70 - 99 mg/dL    Comment: Glucose reference range applies only to samples taken after fasting for at least 8 hours.   Comment 1 Document in Chart   Glucose, capillary     Status: Abnormal   Collection Time: 06/20/23  8:24 AM  Result Value Ref Range   Glucose-Capillary 156 (H) 70 - 99 mg/dL    Comment: Glucose reference range applies only to samples taken after fasting for at least 8 hours.   Comment 1 Notify RN    Comment 2 Document in Chart    *Note: Due to a large number of results and/or encounters for the requested time period, some results have not been displayed. A complete set of results can be found in Results Review.   CT ABDOMEN PELVIS W CONTRAST Result Date: 06/19/2023 CLINICAL DATA:  Bowel obstruction suspected. Abdominal distension, minimal stool output. X-ray suggests ileus. Concern for bowel obstruction. EXAM: CT ABDOMEN AND PELVIS WITH CONTRAST TECHNIQUE: Multidetector CT imaging of the abdomen and pelvis was performed using the standard protocol following bolus administration of intravenous contrast. RADIATION DOSE REDUCTION: This exam was performed according to the departmental dose-optimization program which includes automated exposure control, adjustment of the mA and/or kV according to patient size and/or use of iterative reconstruction technique. CONTRAST:  OMNIPAQUE  IOHEXOL  300 MG/ML  SOLN COMPARISON:  03/24/2022, plain film today. FINDINGS: Lower chest: Trace right pleural effusion. Rounded airspace opacity posteriorly in the left lower lobe is new since prior  study and measures 2 cm, most likely rounded atelectasis or early infiltrate. Hepatobiliary: No focal liver abnormality is seen. Status post cholecystectomy. No biliary dilatation. Pancreas: No focal abnormality or ductal dilatation. Spleen: No focal abnormality.  Normal size. Adrenals/Urinary Tract: No adrenal abnormality. No focal renal abnormality. No stones or hydronephrosis. Urinary bladder is unremarkable. Stomach/Bowel: NG tube in the stomach. Lap band device in place near the GE junction. Stomach is moderately distended. There is fluid and gaseous distention of small bowel loops into the pelvis. Distal small bowel is decompressed. Right colon and transverse colon are also distended with gas and fluid with a transition point in the distal transverse colon. Left colon is decompressed. Vascular/Lymphatic: Aortic atherosclerosis. No evidence of aneurysm or adenopathy. Reproductive: No visible focal abnormality. Other: Trace ascites adjacent to the liver and spleen.  No free air. Musculoskeletal: No acute bony abnormality. Postoperative changes in the thoracolumbar spine, across the SI joints, and in the hips from bilateral hip replacements. IMPRESSION: Dilated small bowel into the pelvis with decompressed distal small bowel. Right colon and transverse colon are also dilated with transition to decompressed left colon. Pattern is nonspecific but could reflect distal small bowel obstruction or ileus. Trace right pleural effusion and ascites. Aortic atherosclerosis. Electronically Signed   By: Janeece Mechanic M.D.   On: 06/19/2023 16:58   DG Abd 1 View Result Date: 06/19/2023 CLINICAL DATA:  Ileus. EXAM: ABDOMEN - 1 VIEW COMPARISON:  Abdominal radiograph dated 06/18/2023. FINDINGS: Enteric tube with tip in the upper abdomen likely in the proximal stomach. Persistent diffuse small bowel dilatation. No acute osseous pathology. IMPRESSION: 1. Enteric tube with tip in the proximal stomach.  2. Persistent diffuse small  bowel dilatation. Electronically Signed   By: Angus Bark M.D.   On: 06/19/2023 11:40   DG CHEST PORT 1 VIEW Result Date: 06/18/2023 CLINICAL DATA:  Status post PICC placement. EXAM: PORTABLE CHEST 1 VIEW COMPARISON:  06/14/2023 FINDINGS: New right upper extremity PICC tip at the SVC brachiocephalic confluence. No pneumothorax. Tip and side port of enteric tube below the diaphragm in the stomach. Stable heart size and mediastinal contours. Bandlike atelectasis at the left lung base. Slight improvement in right basilar atelectasis from prior. No pulmonary edema or large pleural effusion. Left costophrenic angle is excluded from the field of view. IMPRESSION: 1. New right upper extremity PICC tip at the SVC brachiocephalic confluence. No pneumothorax. 2. Bibasilar atelectasis, slightly improved on the right. Electronically Signed   By: Chadwick Colonel M.D.   On: 06/18/2023 14:21   DG Abd 1 View Result Date: 06/18/2023 CLINICAL DATA:  Nasogastric tube placement EXAM: ABDOMEN - 1 VIEW COMPARISON:  06/16/2023 FINDINGS: A nasogastric tube is been placed with tip and side-port in the gastric fundus. Dilated loops of small bowel up to 5.2 cm in diameter. Gaseous prominence of the colon. Postoperative findings in the thoracolumbar spine, right SI joint, and both hips. Lap band noted with 1:30-7:30 clock face orientation on this frontal projection. Bibasilar bandlike densities favoring atelectasis. Lower thoracic spondylosis. IMPRESSION: 1. Nasogastric tube tip and side-port in the gastric fundus. 2. Dilated loops of small bowel up to 5.2 cm in diameter. Gaseous prominence of the colon. Appearance could be from ileus or bowel obstruction. 3. Bibasilar atelectasis. 4. Postoperative findings in the thoracolumbar spine, right SI joint, and both hips. Electronically Signed   By: Freida Jes M.D.   On: 06/18/2023 13:00   US  EKG SITE RITE Result Date: 06/18/2023 If Site Rite image not attached, placement could  not be confirmed due to current cardiac rhythm.     Assessment/Plan pSBO vs ileus  -afebrile, vitals stable - CT 5/27 shows small bowel dilation with some decompressed distal small bowel in the right pelvis.  - exam and CT are concerning for possible pSBO, could be due to adhesions from previous abdominal surgery. He is not peritonitic and there is no urgent/emergent indication for surgery. Attempt non-operative mgmt with SBO protocol/gastrografin .  - continue NG to LIWS - please stop xarelto . Ok for hep gtt if needed   Admit - Orthopedic service with GI and TRH consults.  I reviewed nursing notes, Consultant GI notes, hospitalist notes, last 24 h vitals and pain scores, last 48 h intake and output, last 24 h labs and trends, and last 24 h imaging results.  Charlott Converse, [facility] Surgery 06/20/2023, 8:48 AM Please see Amion for pager number during day hours 7:00am-4:30pm or 7:00am -11:30am on weekends

## 2023-06-20 NOTE — Progress Notes (Signed)
 Rounding Note    Patient Name: Carlos Dawson Date of Encounter: 06/20/2023  [facility] Cardiologist: Aerial Dilley [name], MD   Subjective   Abdominal pain   Inpatient Medications    Scheduled Meds:  aspirin   300 mg Rectal Daily   Chlorhexidine  Gluconate Cloth  6 each Topical Daily   furosemide   40 mg Intravenous Daily   insulin  aspart  0-9 Units Subcutaneous Q4H   metoprolol  tartrate  5 mg Intravenous Q6H   pantoprazole  (PROTONIX ) IV  40 mg Intravenous Q24H   rivaroxaban   10 mg Per Tube Q breakfast   sennosides  10 mL Per Tube BID   sodium chloride  flush  10-40 mL Intracatheter Q12H   tamsulosin   0.4 mg Oral QPM   thiamine (VITAMIN B1) injection  100 mg Intravenous Daily   Continuous Infusions:  potassium chloride      TPN ADULT (ION) 40 mL/hr at 06/20/23 0719   TPN ADULT (ION)     PRN Meds: acetaminophen , albuterol , alum & mag hydroxide-simeth, budesonide -formoterol , cyclobenzaprine , HYDROcodone -acetaminophen , HYDROcodone -acetaminophen , HYDROmorphone  (DILAUDID ) injection, lip balm, magnesium  citrate, menthol -cetylpyridinium **OR** phenol, metoCLOPramide  **OR** metoCLOPramide  (REGLAN ) injection, nitroGLYCERIN , ondansetron  **OR** ondansetron  (ZOFRAN ) IV, mouth rinse, polyethylene glycol, simethicone , sodium chloride , sodium chloride  flush, tiotropium   Vital Signs    Vitals:   06/20/23 0500 06/20/23 0600 06/20/23 0700 06/20/23 0800  BP:  98/67 109/75 117/78  Pulse: 95 83    Resp: 20 15 14 13   Temp:      TempSrc:      SpO2: 96% 99%    Weight: 109.2 kg     Height:        Intake/Output Summary (Last 24 hours) at 06/20/2023 0900 Last data filed at 06/20/2023 0719 Gross per 24 hour  Intake 1070.41 ml  Output 2750 ml  Net -1679.59 ml      06/20/2023    5:00 AM 06/19/2023    4:35 AM 06/18/2023    6:35 AM  Last 3 Weights  Weight (lbs) 240 lb 11.9 oz 241 lb 2.9 oz 251 lb 12.3 oz  Weight (kg) 109.2 kg 109.4 kg 114.2 kg      Telemetry    Cannot r/o PAF    ECG    No new ECG- Personally Reviewed  Physical Exam   More alert  Volume overload plus one bilateral edema Distended abdomen tympanitic not tender Decrease BS base No murmur  Post left hip surgery dressing dry   Labs    High Sensitivity Troponin:  No results for input(s): "TROPONINIHS" in the last 720 hours.   Chemistry Recent Labs  Lab 06/18/23 0310 06/19/23 0405 06/20/23 0454  NA 132* 133* 137  K 3.2* 3.1* 3.1*  CL 102 101 103  CO2 19* 24 25  GLUCOSE 193* 131* 182*  BUN 36* 44* 52*  CREATININE 1.26* 1.22 1.26*  CALCIUM  8.4* 8.0* 8.5*  MG 2.2 2.1 2.4  PROT  --  5.4* 5.6*  ALBUMIN   --  2.7* 3.0*  AST  --  17 15  ALT  --  14 13  ALKPHOS  --  68 75  BILITOT  --  1.6* 1.5*  GFRNONAA 57* 60* 57*  ANIONGAP 11 8 9     Lipids No results for input(s): "CHOL", "TRIG", "HDL", "LABVLDL", "LDLCALC", "CHOLHDL" in the last 168 hours.  Hematology Recent Labs  Lab 06/18/23 0310 06/19/23 0405 06/20/23 0454  WBC 10.2 8.2 4.4  RBC 2.73* 2.72* 2.67*  HGB 9.9* 9.2* 9.4*  HCT 30.0* 28.9* 28.4*  MCV 109.9* 106.3* 106.4*  MCH 36.3* 33.8 35.2*  MCHC 33.0 31.8 33.1  RDW 16.6* 16.6* 16.9*  PLT 198 223 255   Thyroid   Recent Labs  Lab 06/19/23 1501  TSH 1.800    BNP Recent Labs  Lab 06/16/23 0314 06/19/23 1742  BNP 342.4* 300.9*    DDimer No results for input(s): "DDIMER" in the last 168 hours.   Radiology    CT ABDOMEN PELVIS W CONTRAST Result Date: 06/19/2023 CLINICAL DATA:  Bowel obstruction suspected. Abdominal distension, minimal stool output. X-ray suggests ileus. Concern for bowel obstruction. EXAM: CT ABDOMEN AND PELVIS WITH CONTRAST TECHNIQUE: Multidetector CT imaging of the abdomen and pelvis was performed using the standard protocol following bolus administration of intravenous contrast. RADIATION DOSE REDUCTION: This exam was performed according to the departmental dose-optimization program which includes automated exposure control, adjustment of the mA  and/or kV according to patient size and/or use of iterative reconstruction technique. CONTRAST:  OMNIPAQUE  IOHEXOL  300 MG/ML  SOLN COMPARISON:  03/24/2022, plain film today. FINDINGS: Lower chest: Trace right pleural effusion. Rounded airspace opacity posteriorly in the left lower lobe is new since prior study and measures 2 cm, most likely rounded atelectasis or early infiltrate. Hepatobiliary: No focal liver abnormality is seen. Status post cholecystectomy. No biliary dilatation. Pancreas: No focal abnormality or ductal dilatation. Spleen: No focal abnormality.  Normal size. Adrenals/Urinary Tract: No adrenal abnormality. No focal renal abnormality. No stones or hydronephrosis. Urinary bladder is unremarkable. Stomach/Bowel: NG tube in the stomach. Lap band device in place near the GE junction. Stomach is moderately distended. There is fluid and gaseous distention of small bowel loops into the pelvis. Distal small bowel is decompressed. Right colon and transverse colon are also distended with gas and fluid with a transition point in the distal transverse colon. Left colon is decompressed. Vascular/Lymphatic: Aortic atherosclerosis. No evidence of aneurysm or adenopathy. Reproductive: No visible focal abnormality. Other: Trace ascites adjacent to the liver and spleen.  No free air. Musculoskeletal: No acute bony abnormality. Postoperative changes in the thoracolumbar spine, across the SI joints, and in the hips from bilateral hip replacements. IMPRESSION: Dilated small bowel into the pelvis with decompressed distal small bowel. Right colon and transverse colon are also dilated with transition to decompressed left colon. Pattern is nonspecific but could reflect distal small bowel obstruction or ileus. Trace right pleural effusion and ascites. Aortic atherosclerosis. Electronically Signed   By: Janeece Mechanic M.D.   On: 06/19/2023 16:58   DG Abd 1 View Result Date: 06/19/2023 CLINICAL DATA:  Ileus. EXAM:  ABDOMEN - 1 VIEW COMPARISON:  Abdominal radiograph dated 06/18/2023. FINDINGS: Enteric tube with tip in the upper abdomen likely in the proximal stomach. Persistent diffuse small bowel dilatation. No acute osseous pathology. IMPRESSION: 1. Enteric tube with tip in the proximal stomach. 2. Persistent diffuse small bowel dilatation. Electronically Signed   By: Angus Bark M.D.   On: 06/19/2023 11:40   DG CHEST PORT 1 VIEW Result Date: 06/18/2023 CLINICAL DATA:  Status post PICC placement. EXAM: PORTABLE CHEST 1 VIEW COMPARISON:  06/14/2023 FINDINGS: New right upper extremity PICC tip at the SVC brachiocephalic confluence. No pneumothorax. Tip and side port of enteric tube below the diaphragm in the stomach. Stable heart size and mediastinal contours. Bandlike atelectasis at the left lung base. Slight improvement in right basilar atelectasis from prior. No pulmonary edema or large pleural effusion. Left costophrenic angle is excluded from the field of view. IMPRESSION: 1. New right upper extremity PICC  tip at the SVC brachiocephalic confluence. No pneumothorax. 2. Bibasilar atelectasis, slightly improved on the right. Electronically Signed   By: Chadwick Colonel M.D.   On: 06/18/2023 14:21   DG Abd 1 View Result Date: 06/18/2023 CLINICAL DATA:  Nasogastric tube placement EXAM: ABDOMEN - 1 VIEW COMPARISON:  06/16/2023 FINDINGS: A nasogastric tube is been placed with tip and side-port in the gastric fundus. Dilated loops of small bowel up to 5.2 cm in diameter. Gaseous prominence of the colon. Postoperative findings in the thoracolumbar spine, right SI joint, and both hips. Lap band noted with 1:30-7:30 clock face orientation on this frontal projection. Bibasilar bandlike densities favoring atelectasis. Lower thoracic spondylosis. IMPRESSION: 1. Nasogastric tube tip and side-port in the gastric fundus. 2. Dilated loops of small bowel up to 5.2 cm in diameter. Gaseous prominence of the colon. Appearance could  be from ileus or bowel obstruction. 3. Bibasilar atelectasis. 4. Postoperative findings in the thoracolumbar spine, right SI joint, and both hips. Electronically Signed   By: Freida Jes M.D.   On: 06/18/2023 13:00   US  EKG SITE RITE Result Date: 06/18/2023 If Site Rite image not attached, placement could not be confirmed due to current cardiac rhythm.   Cardiac Studies     Patient Profile     83 y.o. male  history of atrial fibrillation status post ablation in 2019 and Watchman in 05/2022 (due to hematuria), CAD status post PCI in 2012, chronic diastolic heart failure, PE, OSA, T2DM, thoracic aortic aneurysm who we are consulted for evaluation of possible atrial fibrillation   Assessment & Plan    Paroxysmal atrial fibrillation: Status post ablation 04/2017 and Watchman 05/2022.   - His telemetry shows mostly NSR with PAC;s may look like NSVT but he has a baseline RBBB  - Lack of PO intake and ileus complicates Rx - PRN iv inderal and consider iv amiodarone  - supplement K  - Check ECG this am - discussed with hospitalist does not need systemic heparin  for PAF as Watchman device is intact can use lovenox or [place] heparin  more for DVT Prophylaxis post hip   Acute on chronic diastolic heart failure: Echocardiogram 5/23 shows EF 70 to 75%, normal RV function.  Significantly volume overloaded after his surgery - He has diuresed well, continue iv Rx and transition to PO when taking PO -I/O's - 1.6 L's Cr 1.26 BUN elevated 52 likely some third spacing supplement K 3.1   CAD: Status post BMS to OM2 in 2012.  Cardiac catheterization in 2019 showed patent stents.  He denies any chest pain.  Notably, he had a stress PET on 10/24 which was high risk study due to marked reduction in myocardial blood flow reserve (though question accuracy in setting of prior PCI), can follow-up as outpatient and consider further ischemic evaluation -Restarted ASA 81 mg daily given history of coronary stenting.   Now unable to take PO, will start rectal ASA  Anemia: Following hip surgery, hemoglobin dropped from 11.2 on 5/6 to 7.9 on 5/23.  Would transfuse for goal hemoglobin greater than 8 given cardiac history.  Received 1 unit PRBCs on 5/23, hemoglobin 9.4 today  Ileus: worsening abdominal pain, unable to take PO.  Management per primary team Getting contrast study today CT cannot r/o distal SBO NGT and TPN   For questions or updates, please contact [facility] Please consult [email or web address] for contact info under        Signed, Janelle Mediate, MD  06/20/2023, 9:00 AM

## 2023-06-20 NOTE — Progress Notes (Signed)
 This patients son, Montay Vanvoorhis, would like to speak with the providers about his fathers care plan. He has expressed concerns about his fathers lethargy, confusion, urine output and color, lab results, and GI issues. He is requesting a nephrology consult related to his fathers kidney labs and urine color. This RN has spoken to him about the current plan we have in place and addressed concerns and frustrations. He is still concerned.

## 2023-06-20 NOTE — Plan of Care (Signed)
  Problem: Activity: Goal: Risk for activity intolerance will decrease Outcome: Progressing   Problem: Pain Managment: Goal: General experience of comfort will improve and/or be controlled Outcome: Progressing   Problem: Safety: Goal: Ability to remain free from injury will improve Outcome: Progressing   Problem: Health Behavior/Discharge Planning: Goal: Ability to manage health-related needs will improve Outcome: Progressing   Problem: Clinical Measurements: Goal: Ability to maintain clinical measurements within normal limits will improve Outcome: Progressing Goal: Respiratory complications will improve Outcome: Progressing   Problem: Activity: Goal: Risk for activity intolerance will decrease Outcome: Progressing   Problem: Pain Managment: Goal: General experience of comfort will improve and/or be controlled Outcome: Progressing

## 2023-06-20 NOTE — Progress Notes (Signed)
   06/20/23 0020  BiPAP/CPAP/SIPAP  $ Non-Invasive Ventilator  Non-Invasive Vent Subsequent  BiPAP/CPAP/SIPAP Pt Type Adult  BiPAP/CPAP/SIPAP DREAMSTATIOND  Reason BIPAP/CPAP not in use NG tube in place  BiPAP/CPAP /SiPAP Vitals  Pulse Rate (!) 112  Resp 19  SpO2 98 %  MEWS Score/Color  MEWS Score 3  MEWS Score Color Yellow

## 2023-06-20 NOTE — Progress Notes (Signed)
 Physical Therapy Treatment Patient Details Name: Carlos Dawson MRN: [id number] DOB: October 31, 1940 Today's Date: 06/20/2023   History of Present Illness 83 year old male with PMH of PAF no longer on anticoagulation, s/p Watchman device 06/15/2022, COPD/asthma not on home O2 prior to admission, OSA on nightly CPAP, anxiety, BPH, prostate cancer, anemia, GERD, DM, chronic diastolic CHF, obesity s/p lap band, HLD, DVT/PE, CAD s/p elective right total hip arthroplasty on 06/11/2023.  Postop course complicated by tachyarrhythmia, hypotension, anemia, ileus versus SBO.  Orthopedics are attending.  TRH/cardiology/GI and general surgery are consulting.  Currently n.p.o./NG tube/PICC line with TPN.    PT Comments  Pt seen in ICU RM# [id number] POD # 9 Pt was OOB on [facility] with Nursing.  General transfer comment: VC's on proper hand placement to "push up" vs "pull up" on walker. Assisted off BSC to recliner.  Pt does well to rise and lower at Min/Mod Assist. General Gait Details: used B platform EVA walker for increased support and promote a great amb distance.  Pt tolerated amb 60 feet x 2 with one extended seated rest break.  Avg HR 86 Performed a few TE's in recliner.  Positioned to comfort and applied ICE to L hip.  Pain well controlled 4/10. Pt plans to return home with Spouse and Family when medically cleared.     If plan is discharge home, recommend the following: Assistance with cooking/housework;Help with stairs or ramp for entrance;Assist for transportation;A lot of help with walking and/or transfers;A lot of help with bathing/dressing/bathroom   Can travel by private vehicle        Equipment Recommendations  None recommended by PT    Recommendations for Other Services       Precautions / Restrictions Precautions Precautions: Fall Precaution/Restrictions Comments: NPO/NG tube/TPN/PICC Restrictions Weight Bearing Restrictions Per Provider Order: No LLE Weight Bearing Per Provider Order: Weight  bearing as tolerated     Mobility  Bed Mobility               General bed mobility comments: OOB on BSC with nursing    Transfers Overall transfer level: Needs assistance Equipment used: Rolling walker (2 wheels) Transfers: Sit to/from Stand, Bed to chair/wheelchair/BSC Sit to Stand: +2 safety/equipment, Mod assist, Min assist           General transfer comment: VC's on proper hand placement to "push up" vs "pull up" on walker. Assisted off BSC to recliner.  Pt does well to rise and lower at Min/Mod Assist.    Ambulation/Gait Ambulation/Gait assistance: Min assist, +2 physical assistance, +2 safety/equipment Gait Distance (Feet): 120 Feet (60 feet x 2) Assistive device: Bilateral platform walker, Blanca Bunch Gait Pattern/deviations: Step-to pattern, Decreased step length - left, Antalgic Gait velocity: decreased     General Gait Details: used B platform EVA walker for increased support and promote a great amb distance.  Pt tolerated amb 60 feet x 2 with one extended seated rest break.  Avg HR 86   Stairs             Wheelchair Mobility     Tilt Bed    Modified Rankin (Stroke Patients Only)       Balance                                            Communication    Cognition Arousal: Alert Behavior During Therapy: [facility]  for tasks assessed/performed   PT - Cognitive impairments: No apparent impairments                       PT - Cognition Comments: AxO x 3 pleasant and motivated.  Retired [employer] development officer.  Lives with Spouse and was IND/Driving. Following commands: Intact      Cueing Cueing Techniques: Verbal cues  Exercises  Total Hip Replacement TE's following HEP Handout 10 reps ankle pumps 05 reps knee presses 05 reps heel slides 05 reps SAQ's 05 reps ABD Instructed how to use a belt loop to assist  Followed by ICE     General Comments        Pertinent Vitals/Pain Pain Assessment Pain Assessment: Faces Faces  Pain Scale: Hurts a little bit Pain Location: L hip and back with movement Pain Descriptors / Indicators: Discomfort, Grimacing, Operative site guarding Pain Intervention(s): Monitored during session, Premedicated before session, Repositioned, Ice applied    Home Living                          Prior Function            PT Goals (current goals can now be found in the care plan section) Progress towards PT goals: Progressing toward goals    Frequency    7X/week      PT Plan      Co-evaluation              AM-PAC PT "6 Clicks" Mobility   Outcome Measure  Help needed turning from your back to your side while in a flat bed without using bedrails?: A Lot Help needed moving from lying on your back to sitting on the side of a flat bed without using bedrails?: A Lot Help needed moving to and from a bed to a chair (including a wheelchair)?: A Lot Help needed standing up from a chair using your arms (e.g., wheelchair or bedside chair)?: A Lot Help needed to walk in hospital room?: A Lot Help needed climbing 3-5 steps with a railing? : Total 6 Click Score: 11    End of Session Equipment Utilized During Treatment: Gait belt Activity Tolerance: Patient tolerated treatment well Patient left: in chair;with call bell/phone within reach;with family/visitor present;with chair alarm set Nurse Communication: Mobility status PT Visit Diagnosis: Difficulty in walking, not elsewhere classified (R26.2);Muscle weakness (generalized) (M62.81) Pain - Right/Left: Left Pain - part of body: Hip;Leg     Time: 0981-1914 PT Time Calculation (min) (ACUTE ONLY): 33 min  Charges:    $Gait Training: 8-22 mins $Therapeutic Exercise: 8-22 mins PT General Charges $$ ACUTE PT VISIT: 1 Visit                     Bess Broody  PTA Acute  Rehabilitation Services Office M-F          [phone]

## 2023-06-20 NOTE — Plan of Care (Signed)
  Problem: Clinical Measurements: Goal: Respiratory complications will improve Outcome: Progressing   Problem: Education: Goal: Knowledge of General Education information will improve Description: Including pain rating scale, medication(s)/side effects and non-pharmacologic comfort measures Outcome: Not Progressing   Problem: Clinical Measurements: Goal: Cardiovascular complication will be avoided Outcome: Not Progressing   Problem: Pain Managment: Goal: General experience of comfort will improve and/or be controlled Outcome: Not Progressing

## 2023-06-20 NOTE — Progress Notes (Signed)
 Subjective: Having some liquid stool output.  Objective: Vital signs in last 24 hours: Temp:  [97.7 F (36.5 C)-98.4 F (36.9 C)] 97.7 F (36.5 C) (05/28 0030) Pulse Rate:  [78-115] 83 (05/28 0600) Resp:  [12-30] 13 (05/28 0800) BP: (90-139)/(26-102) 117/78 (05/28 0800) SpO2:  [88 %-99 %] 99 % (05/28 0600) Weight:  [109.2 kg] 109.2 kg (05/28 0500) Weight change: -0.2 kg Last BM Date : 06/19/23  PE: GEN:  Elderly, frail-appearing, minimally interactive HEENT:  NGT in place ABD:  Protuberant, distended, tympanic  Lab Results: CBC    Component Value Date/Time   WBC 4.4 06/20/2023 0454   RBC 2.67 (L) 06/20/2023 0454   HGB 9.4 (L) 06/20/2023 0454   HGB 11.7 (L) 10/31/2022 0913   HCT 28.4 (L) 06/20/2023 0454   HCT 35.1 (L) 10/31/2022 0913   PLT 255 06/20/2023 0454   PLT 114 (L) 10/31/2022 0913   MCV 106.4 (H) 06/20/2023 0454   MCV 106 (H) 10/31/2022 0913   MCH 35.2 (H) 06/20/2023 0454   MCHC 33.1 06/20/2023 0454   RDW 16.9 (H) 06/20/2023 0454   RDW 14.1 10/31/2022 0913   LYMPHSABS 2.4 03/24/2022 0627   LYMPHSABS 2.0 04/30/2017 1354   MONOABS 0.6 03/24/2022 0627   EOSABS 0.1 03/24/2022 0627   EOSABS 0.2 04/30/2017 1354   BASOSABS 0.0 03/24/2022 0627   BASOSABS 0.0 04/30/2017 1354  CMP     Component Value Date/Time   NA 137 06/20/2023 0454   NA 140 10/31/2022 0913   K 3.1 (L) 06/20/2023 0454   CL 103 06/20/2023 0454   CO2 25 06/20/2023 0454   GLUCOSE 182 (H) 06/20/2023 0454   BUN 52 (H) 06/20/2023 0454   BUN 25 10/31/2022 0913   CREATININE 1.26 (H) 06/20/2023 0454   CREATININE 1.11 10/14/2015 0929   CALCIUM  8.5 (L) 06/20/2023 0454   PROT 5.6 (L) 06/20/2023 0454   PROT 5.9 (L) 10/31/2022 0913   ALBUMIN  3.0 (L) 06/20/2023 0454   ALBUMIN  4.0 10/31/2022 0913   AST 15 06/20/2023 0454   ALT 13 06/20/2023 0454   ALKPHOS 75 06/20/2023 0454   BILITOT 1.5 (H) 06/20/2023 0454   BILITOT 1.2 10/31/2022 0913   GFR 69.63 05/11/2015 1443   EGFR 73 10/31/2022 0913    GFRNONAA 57 (L) 06/20/2023 0454   Assessment:   Ileus vs pSBO.  Plan:   Minimize narcotics, mobilize as feasible, replete electrolytes (potassium). No role for any GI endoscopic procedure. Further management per surgical team. [facility] will sign-off; please call with questions; thank you for the consultation.   Yves Herb 06/20/2023, 9:21 AM   Cell [phone] If no answer or after 5 PM call [phone]

## 2023-06-20 NOTE — Progress Notes (Addendum)
 PHARMACY - TOTAL PARENTERAL NUTRITION CONSULT NOTE   Indication: Prolonged ileus  Patient Measurements: Height: 5' 5.5" (166.4 cm) Weight: 109.2 kg (240 lb 11.9 oz) IBW/kg (Calculated) : 62.65 TPN AdjBW (KG): 75.6 Body mass index is 39.45 kg/m. Usual Weight: 235lb on 05/29/23   Assessment:  83 YO male admitted by the orthopedic service on 5/19 after L hip total arthroplasty. Continued admission given concern for hypotension, tachycardia, and dyspnea with exertion (cardiology following). Per 5/24 KUB, likely ileus. Per MD, patient has not had adequate nutrition x3 days post-op. Pharmacy consulted for TPN management.  Glucose / Insulin : Hx of T2DM - CBGs 122-182 in last 24hrs - 8 units of insulin  used in the last 24hrs Electrolytes: K 3.1 (low), Mg 2.4 (upper end of normal range), all others WNL including CoCa at 9.0 Renal: Scr slightly elevated at 1.26 (BL 1-1.10), BUN 52 (elevated) Hepatic: albumin  3.0 (low), Tbili 1.5 (slightly elevated), LFTs and alk phos WNL Intake / Output; MIVF:  PO (NGT): +200 mL/24 hrs UOP: -1100 mL/24 hrs Emesis/NG output: -1650 mL/24 hrs  GI Imaging: - 5/24 DG abd: Diffuse gaseous distention of the colon most consistent with ileus. No evidence of small-bowel obstruction. - 5/26 DG abd: Dilated loops of small bowel up to 5.2 cm in diameter. Gaseous prominence of the colon. Appearance could be from ileus or bowel obstruction. - 5/27 CT a/p: dilated small bowel, R colon, and transverse colon >> could reflect distal SBO or ileus.  GI Surgeries / Procedures: N/A  Central access: 5/26 TPN start date: 5/27  Nutritional Goals: Goal TPN rate is 80 mL/hr (provides 99.8 g of protein and 2047 kcals per day)  RD Assessment: Estimated Needs Total Energy Estimated Needs: 1900-2100 kcals Total Protein Estimated Needs: 95-115 grams Total Fluid Estimated Needs: >/= 1.9L - Pharmacy targeting low end of fluid requirement (1920 mL/day) given patient's history of HF and  significant volume overload post-op  Current Nutrition:  NPO and TPN NGT placed 5/26  Plan:  Now: Patient receiving IV Kcl 10mEq x4  At 1800:  Increase TPN to goal rate of 54mL/hr Electrolytes in TPN:  Na 50 mEq/L K 50 mEq/L Ca 5 mEq/L Mg 0 mEq/L - decreased Phos 15 mmol/L Cl:Ac 1:1 Add standard MVI and trace elements to TPN Continue Sensitive q4h SSI and adjust as needed  Monitor TPN labs on Mon/Thurs, and PRN IV thiamine  100mg  daily x5 days ordered (thru 5/31)   Roselee Cong, PharmD Clinical Pharmacist  5/28/20257:38 AM

## 2023-06-20 NOTE — Progress Notes (Signed)
 PROGRESS NOTE   Carlos Dawson  ZOX:[id number]    DOB: 16-Jan-1941    DOA: 06/11/2023  PCP: Carlos Feeling, MD   I have briefly reviewed patients previous medical records in [facility].   Brief Hospital Course:  83 year old male with PMH of PAF no longer on anticoagulation, s/p Watchman device 06/15/2022, COPD/asthma not on home O2 prior to admission, OSA on nightly CPAP, anxiety, BPH, prostate cancer, anemia, GERD, DM, chronic diastolic CHF, obesity s/p lap band, HLD, DVT/PE, CAD s/p elective right total hip arthroplasty on 06/11/2023.  Postop course complicated by tachyarrhythmia, hypotension, anemia, ileus versus SBO.  Orthopedics are attending.  TRH/cardiology/GI and general surgery are consulting.  Currently n.p.o./NG tube/PICC line with TPN.   Assessment & Plan:   Postop ileus versus SBO Ileus felt to be more likely. Managing conservatively with bowel rest/n.p.o., NG tube, PICC line with TPN. Overnight had 2-3 medium to large loose mucousy BMs despite which abdomen still remains quite distended, 1650 mL NG output in the last 24 hours. CT A/P 5/27: Detailed report appreciated.  Concern of distal SBO or ileus. Mobilize.  Minimize opioids.  Trend correct electrolyte abnormalities as needed. [facility] input 5/27 appreciated.  They did not think that a rectal tube would be helpful. General surgery formally consulted on 5/28.  Discussed with [facility] APP`.  PAF with tachycardia Cardiology following. Post hip surgery, he was noted to be hypotensive, tachycardic and thought to be in A-fib with RVR. S/p ablation 04/2017 and Watchman device 05/2022. Currently on IV metoprolol  5 mg Q6 hourly, rate reasonably controlled. Was on IV amiodarone earlier but not on it now.  May need it again if rates increase. Currently on Xarelto  10 mg daily.  However per [facility], likely not absorbing this due to his ileus/SBO and recommend switching to IV heparin .  Will check with pharmacy and cardiology if he needs to  continue anticoagulation then we will switch to IV heparin  As per cardiology, his telemetry mostly shows normal sinus rhythm with frequent PACs with a baseline of RBBB.  Acute on chronic diastolic CHF/anasarca TTE 5/23: LVEF 70-75% Remains with significant volume overload/anasarca-this is likely multifactorial due to hypoalbuminemia, IV fluid resuscitation and CHF Currently on IV Lasix  40 mg daily, may need to consider increasing dose -2.2 L over the last 24 hours and -6.6 L since hospital admission  CAD s/p PCI As per cardiology, stress PET on 10/24 was a high risk study.  They recommend outpatient follow-up to consider ischemic evaluation Currently on rectal aspirin  300 Mg daily while NPO.  Continue beta-blockers.  Acute respiratory failure with hypoxia Suspect multifactorial As per chart review, previously had oxygen  saturations of 87% on room air Currently saturating in the high 90s on 2 L/min [place] oxygen .  Continue O2 support and wean oxygen  off as tolerated  Macrocytic anemia History of iron  deficiency anemia Hemoglobin dropped from 11.2 on 5/6-7.9 on 5/23.  S/p 1 unit of PRBC on 5/23. Hemoglobin stable in the low to mid 9 g range.  Attempt to keep hemoglobin >8 g per DL.  Hypokalemia Replace and follow.   Attempt to keep K >4 and mag >2.  S/p left total hip arthroplasty on 5/19 Management per primary service  History of thoracic aortic aneurysm Outpatient follow-up  History of PE Per home med rec review, did not appear to be on anticoagulation Currently on Xarelto .  Plan to discuss with orthopedic attending and pharmacy regarding current indication and if needs to continue Xarelto  then  we will transition to IV heparin  while having GI issues as noted above.  BPH/history of prostate cancer Resume home meds when able.  Obesity s/p lap banding Noted  OSA on nightly CPAP Continue as tolerated. Check VBG for CO2 retention  Delirium Likely multifactorial.  Delirium  precautions.  Minimize opioids/sedatives.  Family at bedside would be helpful. Do not think that the minimally elevated ammonia 51 on 5/27 is of significance. TSH normal.  Type II DM A1c 5.7 on 5/6 Remains on NovoLog  SSI.  Reasonable inpatient control.  Elevated creatinine Creatinine of 0.98 on 5/6, meeting for stage II CKD Since hospital admission, creatinine has peaked to 1.26. Does not meet criteria for AKI. Monitor BMP Carlos Dawson while on TPN, IV Lasix  etc. Minimize nephrotoxic agents.  Body mass index is 39.45 kg/m./Obesity Complicates care.   DVT prophylaxis: rivaroxaban  (XARELTO ) tablet 10 mg Start: 06/20/23 0800 Place TED hose Start: 06/11/23 1651 SCDs Start: 06/11/23 1433     Code Status: Full Code:  Family Communication: Discussed extensively with patient's son at bedside. Disposition:  Status is: Inpatient Remains inpatient appropriate because: Remains quite ill as noted above with NG tube, TPN, n.p.o., ongoing ileus versus SBO requiring multiple consultants input.  Not medically stable for DC.     Consultants:   [facility] Cardiology [facility] General Surgery  Procedures:   As noted above. PICC line NGT  Subjective:  As per history from night RN, some confusion but no agitation.  Oriented.  600 mL NG tube output overnight.  2-3 medium to large loose, mucousy BMs overnight. As per patient, states that his abdominal size is at baseline although clearly appears distended and almost tense.  Denies abdominal pain.  Left hip and low back pain better.  Denies chest pain, cough or dyspnea.  Mild dry.  Per son at bedside, somewhat confused.  Objective:   Vitals:   06/20/23 0400 06/20/23 0500 06/20/23 0600 06/20/23 0700  BP: 101/79  98/67 109/75  Pulse: 89 95 83   Resp: 15 20 15 14   Temp:      TempSrc:      SpO2: 99% 96% 99%   Weight:  109.2 kg    Height:        General exam: Elderly male, moderately built and obese lying comfortably propped up in bed without  distress. Respiratory system: Diminished breath sounds in the bases but otherwise clear to auscultation without wheezing, rhonchi or crackles.  Is on [place] oxygen  2 L/min. Cardiovascular system: S1 & S2 heard, RRR. No JVD, murmurs, rubs, gallops or clicks.  Has 1-2+ pitting bilateral leg edema which extends to mid thigh.  Telemetry personally reviewed: Underlying BBB, sinus rhythm with frequent PACs-controlled rate. Gastrointestinal system: Abdomen is quite distended, almost tense, not tender.  Has palpable Lap-Band in the lower epigastric region.  No other surgical scars noted.  Paucity of bowel sounds.  No organomegaly or masses appreciated. Central nervous system: Alert and oriented x 3.  Answers all questions appropriately but difficult to understand his speech likely due to dry mouth. No focal neurological deficits. Extremities: Symmetric 5 x 5 power. Skin: No rashes, lesions or ulcers Psychiatry: Judgement and insight appear normal. Mood & affect appropriate.     Data Reviewed:   I have personally reviewed following labs and imaging studies   CBC: Recent Labs  Lab 06/18/23 0310 06/19/23 0405 06/20/23 0454  WBC 10.2 8.2 4.4  HGB 9.9* 9.2* 9.4*  HCT 30.0* 28.9* 28.4*  MCV 109.9* 106.3* 106.4*  PLT 198 223 255    Basic Metabolic Panel: Recent Labs  Lab 06/16/23 0314 06/17/23 0304 06/18/23 0310 06/19/23 0405 06/20/23 0454  NA 134* 135 132* 133* 137  K 3.5 3.7 3.2* 3.1* 3.1*  CL 106 107 102 101 103  CO2 23 21* 19* 24 25  GLUCOSE 159* 130* 193* 131* 182*  BUN 32* 30* 36* 44* 52*  CREATININE 1.13 1.03 1.26* 1.22 1.26*  CALCIUM  8.4* 8.3* 8.4* 8.0* 8.5*  MG 2.2 2.3 2.2 2.1 2.4  PHOS  --   --   --  3.2 2.7    Liver Function Tests: Recent Labs  Lab 06/19/23 0405 06/20/23 0454  AST 17 15  ALT 14 13  ALKPHOS 68 75  BILITOT 1.6* 1.5*  PROT 5.4* 5.6*  ALBUMIN  2.7* 3.0*    CBG: Recent Labs  Lab 06/19/23 2034 06/20/23 0033 06/20/23 0459  GLUCAP 151* 162* 166*     Microbiology Studies:   Recent Results (from the past 240 hours)  MRSA Next Gen by PCR, Nasal     Status: None   Collection Time: 06/14/23  6:32 PM   Specimen: Nasal Mucosa; Nasal Swab  Result Value Ref Range Status   MRSA by PCR Next Gen NOT DETECTED NOT DETECTED Final    Comment: (NOTE) The GeneXpert MRSA Assay (FDA approved for NASAL specimens only), is one component of a comprehensive MRSA colonization surveillance program. It is not intended to diagnose MRSA infection nor to guide or monitor treatment for MRSA infections. Test performance is not FDA approved in patients less than 8 years old. Performed at [facility], [address], [place], [place] [address]     Radiology Studies:  CT ABDOMEN PELVIS W CONTRAST Result Date: 06/19/2023 CLINICAL DATA:  Bowel obstruction suspected. Abdominal distension, minimal stool output. X-ray suggests ileus. Concern for bowel obstruction. EXAM: CT ABDOMEN AND PELVIS WITH CONTRAST TECHNIQUE: Multidetector CT imaging of the abdomen and pelvis was performed using the standard protocol following bolus administration of intravenous contrast. RADIATION DOSE REDUCTION: This exam was performed according to the departmental dose-optimization program which includes automated exposure control, adjustment of the mA and/or kV according to patient size and/or use of iterative reconstruction technique. CONTRAST:  OMNIPAQUE  IOHEXOL  300 MG/ML  SOLN COMPARISON:  03/24/2022, plain film today. FINDINGS: Lower chest: Trace right pleural effusion. Rounded airspace opacity posteriorly in the left lower lobe is new since prior study and measures 2 cm, most likely rounded atelectasis or early infiltrate. Hepatobiliary: No focal liver abnormality is seen. Status post cholecystectomy. No biliary dilatation. Pancreas: No focal abnormality or ductal dilatation. Spleen: No focal abnormality.  Normal size. Adrenals/Urinary Tract: No adrenal  abnormality. No focal renal abnormality. No stones or hydronephrosis. Urinary bladder is unremarkable. Stomach/Bowel: NG tube in the stomach. Lap band device in place near the GE junction. Stomach is moderately distended. There is fluid and gaseous distention of small bowel loops into the pelvis. Distal small bowel is decompressed. Right colon and transverse colon are also distended with gas and fluid with a transition point in the distal transverse colon. Left colon is decompressed. Vascular/Lymphatic: Aortic atherosclerosis. No evidence of aneurysm or adenopathy. Reproductive: No visible focal abnormality. Other: Trace ascites adjacent to the liver and spleen.  No free air. Musculoskeletal: No acute bony abnormality. Postoperative changes in the thoracolumbar spine, across the SI joints, and in the hips from bilateral hip replacements. IMPRESSION: Dilated small bowel into the pelvis with decompressed distal small bowel. Right colon and  transverse colon are also dilated with transition to decompressed left colon. Pattern is nonspecific but could reflect distal small bowel obstruction or ileus. Trace right pleural effusion and ascites. Aortic atherosclerosis. Electronically Signed   By: Janeece Mechanic M.D.   On: 06/19/2023 16:58   DG Abd 1 View Result Date: 06/19/2023 CLINICAL DATA:  Ileus. EXAM: ABDOMEN - 1 VIEW COMPARISON:  Abdominal radiograph dated 06/18/2023. FINDINGS: Enteric tube with tip in the upper abdomen likely in the proximal stomach. Persistent diffuse small bowel dilatation. No acute osseous pathology. IMPRESSION: 1. Enteric tube with tip in the proximal stomach. 2. Persistent diffuse small bowel dilatation. Electronically Signed   By: Angus Bark M.D.   On: 06/19/2023 11:40   DG CHEST PORT 1 VIEW Result Date: 06/18/2023 CLINICAL DATA:  Status post PICC placement. EXAM: PORTABLE CHEST 1 VIEW COMPARISON:  06/14/2023 FINDINGS: New right upper extremity PICC tip at the SVC brachiocephalic  confluence. No pneumothorax. Tip and side port of enteric tube below the diaphragm in the stomach. Stable heart size and mediastinal contours. Bandlike atelectasis at the left lung base. Slight improvement in right basilar atelectasis from prior. No pulmonary edema or large pleural effusion. Left costophrenic angle is excluded from the field of view. IMPRESSION: 1. New right upper extremity PICC tip at the SVC brachiocephalic confluence. No pneumothorax. 2. Bibasilar atelectasis, slightly improved on the right. Electronically Signed   By: Chadwick Colonel M.D.   On: 06/18/2023 14:21   DG Abd 1 View Result Date: 06/18/2023 CLINICAL DATA:  Nasogastric tube placement EXAM: ABDOMEN - 1 VIEW COMPARISON:  06/16/2023 FINDINGS: A nasogastric tube is been placed with tip and side-port in the gastric fundus. Dilated loops of small bowel up to 5.2 cm in diameter. Gaseous prominence of the colon. Postoperative findings in the thoracolumbar spine, right SI joint, and both hips. Lap band noted with 1:30-7:30 clock face orientation on this frontal projection. Bibasilar bandlike densities favoring atelectasis. Lower thoracic spondylosis. IMPRESSION: 1. Nasogastric tube tip and side-port in the gastric fundus. 2. Dilated loops of small bowel up to 5.2 cm in diameter. Gaseous prominence of the colon. Appearance could be from ileus or bowel obstruction. 3. Bibasilar atelectasis. 4. Postoperative findings in the thoracolumbar spine, right SI joint, and both hips. Electronically Signed   By: Freida Jes M.D.   On: 06/18/2023 13:00   US  EKG SITE RITE Result Date: 06/18/2023 If Site Rite image not attached, placement could not be confirmed due to current cardiac rhythm.   Scheduled Meds:    aspirin   300 mg Rectal Daily   Chlorhexidine  Gluconate Cloth  6 each Topical Daily   furosemide   40 mg Intravenous Daily   insulin  aspart  0-9 Units Subcutaneous Q4H   metoprolol  tartrate  5 mg Intravenous Q6H   pantoprazole   (PROTONIX ) IV  40 mg Intravenous Q24H   rivaroxaban   10 mg Per Tube Q breakfast   sennosides  10 mL Per Tube BID   sodium chloride  flush  10-40 mL Intracatheter Q12H   tamsulosin   0.4 mg Oral QPM   thiamine  (VITAMIN B1) injection  100 mg Intravenous Daily    Continuous Infusions:    potassium chloride      TPN ADULT (ION) 40 mL/hr at 06/20/23 0719     LOS: 6 days     Aubrey Blas, MD,  FACP, [facility], [facility], [facility]   [facility] [place]      To contact the attending provider between 7A-7P or the covering  provider during after hours 7P-7A, please log into the web site [email or web address] and access using universal [place] password for that web site. If you do not have the password, please call the hospital operator.  06/20/2023, 7:52 AM

## 2023-06-21 ENCOUNTER — Inpatient Hospital Stay (HOSPITAL_COMMUNITY)

## 2023-06-21 DIAGNOSIS — E876 Hypokalemia: Secondary | ICD-10-CM | POA: Diagnosis not present

## 2023-06-21 DIAGNOSIS — K567 Ileus, unspecified: Secondary | ICD-10-CM | POA: Diagnosis not present

## 2023-06-21 DIAGNOSIS — I480 Paroxysmal atrial fibrillation: Secondary | ICD-10-CM | POA: Diagnosis not present

## 2023-06-21 DIAGNOSIS — I959 Hypotension, unspecified: Secondary | ICD-10-CM | POA: Diagnosis not present

## 2023-06-21 LAB — CBC
HCT: 28.1 % — ABNORMAL LOW (ref 39.0–52.0)
Hemoglobin: 8.9 g/dL — ABNORMAL LOW (ref 13.0–17.0)
MCH: 34.6 pg — ABNORMAL HIGH (ref 26.0–34.0)
MCHC: 31.7 g/dL (ref 30.0–36.0)
MCV: 109.3 fL — ABNORMAL HIGH (ref 80.0–100.0)
Platelets: 208 10*3/uL (ref 150–400)
RBC: 2.57 MIL/uL — ABNORMAL LOW (ref 4.22–5.81)
RDW: 17.3 % — ABNORMAL HIGH (ref 11.5–15.5)
WBC: 5.6 10*3/uL (ref 4.0–10.5)
nRBC: 2 % — ABNORMAL HIGH (ref 0.0–0.2)

## 2023-06-21 LAB — MAGNESIUM: Magnesium: 2.4 mg/dL (ref 1.7–2.4)

## 2023-06-21 LAB — GLUCOSE, CAPILLARY
Glucose-Capillary: 207 mg/dL — ABNORMAL HIGH (ref 70–99)
Glucose-Capillary: 171 mg/dL — ABNORMAL HIGH (ref 70–99)
Glucose-Capillary: 188 mg/dL — ABNORMAL HIGH (ref 70–99)
Glucose-Capillary: 180 mg/dL — ABNORMAL HIGH (ref 70–99)
Glucose-Capillary: 166 mg/dL — ABNORMAL HIGH (ref 70–99)

## 2023-06-21 LAB — COMPREHENSIVE METABOLIC PANEL WITH GFR
ALT: 12 U/L (ref 0–44)
AST: 14 U/L — ABNORMAL LOW (ref 15–41)
Albumin: 2.8 g/dL — ABNORMAL LOW (ref 3.5–5.0)
Alkaline Phosphatase: 70 U/L (ref 38–126)
Anion gap: 7 (ref 5–15)
BUN: 51 mg/dL — ABNORMAL HIGH (ref 8–23)
CO2: 25 mmol/L (ref 22–32)
Calcium: 8.1 mg/dL — ABNORMAL LOW (ref 8.9–10.3)
Chloride: 105 mmol/L (ref 98–111)
Creatinine, Ser: 1.19 mg/dL (ref 0.61–1.24)
GFR, Estimated: >60 mL/min (ref >60)
Glucose, Bld: 209 mg/dL — ABNORMAL HIGH (ref 70–99)
Potassium: 2.8 mmol/L — ABNORMAL LOW (ref 3.5–5.1)
Sodium: 137 mmol/L (ref 135–145)
Total Bilirubin: 1.5 mg/dL — ABNORMAL HIGH (ref 0.0–1.2)
Total Protein: 5.7 g/dL — ABNORMAL LOW (ref 6.5–8.1)

## 2023-06-21 LAB — PHOSPHORUS: Phosphorus: 2.8 mg/dL (ref 2.5–4.6)

## 2023-06-21 MED ORDER — ALBUMIN HUMAN 25 % IV SOLN
12.5000 g | Freq: Four times a day (QID) | INTRAVENOUS | Status: AC
  Administered 2023-06-21 (×2): 12.5 g via INTRAVENOUS
  Filled 2023-06-21 (×2): qty 50

## 2023-06-21 MED ORDER — POTASSIUM CHLORIDE 10 MEQ/100ML IV SOLN
10.0000 meq | INTRAVENOUS | Status: AC
  Administered 2023-06-21 (×5): 10 meq via INTRAVENOUS
  Filled 2023-06-21 (×5): qty 100

## 2023-06-21 MED ORDER — METOPROLOL TARTRATE 5 MG/5ML IV SOLN
2.5000 mg | Freq: Four times a day (QID) | INTRAVENOUS | Status: DC
  Administered 2023-06-21 – 2023-06-23 (×8): 2.5 mg via INTRAVENOUS
  Filled 2023-06-21 (×8): qty 5

## 2023-06-21 MED ORDER — TIZANIDINE HCL 4 MG PO TABS
4.0000 mg | ORAL_TABLET | Freq: Three times a day (TID) | ORAL | Status: DC | PRN
  Administered 2023-06-23: 4 mg
  Filled 2023-06-21: qty 1

## 2023-06-21 MED ORDER — INSULIN ASPART 100 UNIT/ML IJ SOLN
0.0000 [IU] | INTRAMUSCULAR | Status: DC
  Administered 2023-06-21 – 2023-06-22 (×6): 3 [IU] via SUBCUTANEOUS
  Administered 2023-06-22: 5 [IU] via SUBCUTANEOUS
  Administered 2023-06-22 – 2023-06-23 (×6): 3 [IU] via SUBCUTANEOUS
  Administered 2023-06-24 (×2): 2 [IU] via SUBCUTANEOUS

## 2023-06-21 MED ORDER — TRAVASOL 10 % IV SOLN
INTRAVENOUS | Status: AC
  Filled 2023-06-21: qty 998.4

## 2023-06-21 MED ORDER — POTASSIUM CHLORIDE 10 MEQ/100ML IV SOLN
10.0000 meq | INTRAVENOUS | Status: AC
  Administered 2023-06-21: 10 meq via INTRAVENOUS
  Filled 2023-06-21: qty 100

## 2023-06-21 MED ORDER — ACETAMINOPHEN 10 MG/ML IV SOLN
1000.0000 mg | Freq: Four times a day (QID) | INTRAVENOUS | Status: DC | PRN
  Administered 2023-06-21: 1000 mg via INTRAVENOUS
  Filled 2023-06-21: qty 100

## 2023-06-21 MED ORDER — FUROSEMIDE 10 MG/ML IJ SOLN: 40.0000 mg | Freq: Every day | INTRAMUSCULAR | Status: DC

## 2023-06-21 NOTE — Progress Notes (Signed)
 Nutrition Follow-up  DOCUMENTATION CODES:   Obesity unspecified  INTERVENTION:  - TPN to continue at goal of 63mL/hr.             - TPN management per pharmacy.    - Daily weights while on TPN.   - Will monitor for diet advancement.   NUTRITION DIAGNOSIS:   Altered GI function related to acute illness (ileus) as evidenced by other (comment) (need for TPN per MD). *ongoing  GOAL:   Patient will meet greater than or equal to 90% of their needs *met with TPN  MONITOR:   PO intake, Diet advancement, Labs, Weight trends, I & O's  REASON FOR ASSESSMENT:   Consult Assessment of nutrition requirement/status, New TPN/TNA  ASSESSMENT:   83 y.o. male wit PMH significant for CAD, diastolic CHF, COPD, paroxysmal atrial fibrillation who was admitted to the hospital by the orthopedic service on 5/19 after successful left hip total arthroplasty who then began having issues with hypotension, tachycardia and dyspnea with exertion.  5/19 S/p left total hip arthroplasty; Admitted 5/24 CLD; abdominal xray showing ileus 5/26 NPO; NGT placed for suction 5/27 TPN initiated  Patient working with PT at first time of visit then another provider at second time of visit.  Patient remains NPO at this time. TPN was increased to goal of 12mL/hr yesterday and plan to continue goal TPN for now. Goal provides 100g of protein and 2047 kcals. Per Surgery notes today, plan for NG clamp trial likely tomorrow. Patient noted to have had out yesterday.   Admit weight: 234# Current weight: 241# I&O's: -8.2L since admit   Medications reviewed and include: Senokot. 100mg  thiamine (x5 days)   Labs reviewed:  K+ 2.8   Diet Order:   Diet Order             Diet NPO time specified  Diet effective now           Diet - low sodium heart healthy                   EDUCATION NEEDS:  Not appropriate for education at this time  Skin:  Skin Assessment: Skin Integrity Issues: Skin Integrity  Issues:: DTI, Incisions DTI: Buttocks Incisions: Left Hip  Last BM:  5/26 - type 7  Height:  Ht Readings from Last 1 Encounters:  06/14/23 5' 5.5" (1.664 m)   Weight:  Wt Readings from Last 1 Encounters:  06/21/23 109.5 kg    BMI:  Body mass index is 39.56 kg/m.  Estimated Nutritional Needs:  Kcal:  1900-2100 kcals Protein:  95-115 grams Fluid:  >/= 1.9L    Scheryl Cushing RD, LDN Contact via Secure Chat.

## 2023-06-21 NOTE — Progress Notes (Signed)
 Subjective: 10 Days Post-Op Procedure(s) (LRB): ARTHROPLASTY, HIP, TOTAL, ANTERIOR APPROACH (Left) Patient seen in rounds for Dr. France Ina Patient resting in bed with family at bedside. Reports the hip is doing okay. He worked with physical therapy yesterday with minimal pain in the hip. He notes some muscle spasms and soreness.  Objective: Vital signs in last 24 hours: Temp:  [97.2 F (36.2 C)-99.4 F (37.4 C)] 97.2 F (36.2 C) (05/29 0645) Pulse Rate:  [56-140] 91 (05/29 0600) Resp:  [13-37] 15 (05/29 0600) BP: (90-143)/(42-93) 102/56 (05/29 0600) SpO2:  [80 %-100 %] 100 % (05/29 0600) Weight:  [109.5 kg] 109.5 kg (05/29 0442)  Intake/Output from previous day:  Intake/Output Summary (Last 24 hours) at 06/21/2023 0836 Last data filed at 06/21/2023 0645 Gross per 24 hour  Intake 911.52 ml  Output 2600 ml  Net -1688.48 ml    Intake/Output this shift: No intake/output data recorded.  Labs: Recent Labs    06/19/23 0405 06/20/23 0454 06/21/23 0441  HGB 9.2* 9.4* 8.9*   Recent Labs    06/20/23 0454 06/21/23 0441  WBC 4.4 5.6  RBC 2.67* 2.57*  HCT 28.4* 28.1*  PLT 255 208   Recent Labs    06/20/23 0454 06/21/23 0441  NA 137 137  K 3.1* 2.8*  CL 103 105  CO2 25 25  BUN 52* 51*  CREATININE 1.26* 1.19  GLUCOSE 182* 209*  CALCIUM  8.5* 8.1*   No results for input(s): "LABPT", "INR" in the last 72 hours.  Exam: General - Patient is Alert and Oriented Extremity - Neurologically intact Neurovascular intact Sensation intact distally Dorsiflexion/Plantar flexion intact Dressing/Incision - clean, dry, no drainage Motor Function - intact, moving foot and toes well on exam.  Past Medical History:  Diagnosis Date   Anticoagulant long-term use    Aortic root enlargement (HCC)    Ascending aortic aneurysm (HCC)    recent scan in October 2012 showing no change; followed by Dr. Nicanor Barge   ASCVD (arteriosclerotic cardiovascular disease)    Prior BMS to the 2nd OM  in September 2012; with repeat cath in October showing patency   CAD (coronary artery disease)    a. s/p BMS to 2nd OM in Sept 2012; b. LexiScan  Myoview  (12/2012):  Inf infarct; bowel and motion artifact make study difficult to interpret; no ischemia; not gated; Low Risk   CHF (congestive heart failure) (HCC)    no recent issues 10/13/14   Chronic back pain    "all over my back" (05/11/2017)   Colonic polyp    Contact lens/glasses fitting    COPD (chronic obstructive pulmonary disease) (HCC)    Diastolic dysfunction    DVT (deep venous thrombosis) (HCC)    ?LLE   GERD (gastroesophageal reflux disease)    Hearing loss    Hearing loss    more so on left   Hemorrhoids    History of stomach ulcers    Hypertension    IBS (irritable bowel syndrome)    LVH (left ventricular hypertrophy)    OA (osteoarthritis)    "all over" (05/11/2017)   Obesities, morbid (HCC)    OSA (obstructive sleep apnea)    PSG 03/30/97 AHI 21, BPAP 13/9   OSA on CPAP    PAF (paroxysmal atrial fibrillation) (HCC)    a. on Xarelto  b. s/p DCCV in 08/2016; b. Tikosyn  failed 04/16/17 with plans for Multaq  and possible Afib ablation with Dr. Nunzio Belch   Pneumonia    'several times" (05/11/2017)   Presence  of Watchman left atrial appendage closure device 06/15/2022   24mm Watchman FLX Pro placed by Dr. Marven Slimmer   Prostate CA [facility])    Oncologist  DR. Cristal [facility] dx 09/24/14, undetermined tx   prostate; S/P "radiation and hormone injections"   Pulmonary embolism (HCC) 2008   "both lungs"   SOB (shortness of breath)    on excertion   Thoracic aortic aneurysm (HCC)    Aortic Size Index=     5.0    /Body surface area is 2.43 meters squared. = 2.05  < 2.75 cm/m2      4% risk per year 2.75 to 4.25          8% risk per year > 4.25 cm/m2    20% risk per year   Stable aneurysmal dilation of the ascending aorta with maximum AP diameter of 4.8 cm. Stable area of narrowing of the proximal most portion of the descending aorta measuring  2 cm., previously identified as an area of coarctation. No evidence of aortic dissection.  Coronary artery disease.  Normal appearance of the lungs.   Electronically Signed   By: Dobrinka  Dimitrova M.D.   On: 10/01/2014 08:50     Type II diabetes mellitus (HCC)    metphormin, average 154 dx 2017    Assessment/Plan: 10 Days Post-Op Procedure(s) (LRB): ARTHROPLASTY, HIP, TOTAL, ANTERIOR APPROACH (Left) Principal Problem:   Ileus (HCC) Active Problems:   OA (osteoarthritis) of hip   Paroxysmal atrial fibrillation (HCC)   Hypotension   SOB (shortness of breath)   Anticoagulation management encounter   Primary osteoarthritis of left hip   Status post hip replacement   NSVT (nonsustained ventricular tachycardia) (HCC)   Acute blood loss anemia  Estimated body mass index is 39.56 kg/m as calculated from the following:   Height as of this encounter: 5' 5.5" (1.664 m).   Weight as of this encounter: 109.5 kg.  DVT Prophylaxis - heparin  per hospitalist recommendation Weight-bearing as tolerated.  Continue physical therapy as tolerated.  Alfreda Imus, PA-C Orthopedic Surgery 06/21/2023, 8:36 AM

## 2023-06-21 NOTE — Progress Notes (Signed)
 Patient currently still has NG tube in place.  No CPAP being used at this time.

## 2023-06-21 NOTE — Plan of Care (Signed)
  Problem: Education: Goal: Knowledge of General Education information will improve Description: Including pain rating scale, medication(s)/side effects and non-pharmacologic comfort measures Outcome: Progressing   Problem: Clinical Measurements: Goal: Ability to maintain clinical measurements within normal limits will improve Outcome: Progressing Goal: Will remain free from infection Outcome: Progressing Goal: Diagnostic test results will improve Outcome: Progressing Goal: Respiratory complications will improve Outcome: Progressing Goal: Cardiovascular complication will be avoided Outcome: Progressing   Problem: Activity: Goal: Risk for activity intolerance will decrease Outcome: Progressing   Problem: Coping: Goal: Level of anxiety will decrease Outcome: Progressing   Problem: Pain Managment: Goal: General experience of comfort will improve and/or be controlled Outcome: Progressing   Problem: Safety: Goal: Ability to remain free from injury will improve Outcome: Progressing   Problem: Skin Integrity: Goal: Risk for impaired skin integrity will decrease Outcome: Progressing

## 2023-06-21 NOTE — Progress Notes (Deleted)
   06/21/23 2200  BiPAP/CPAP/SIPAP  FiO2 (%) 21 %  Patient Home Machine Yes  Safety Check Completed by RT for Home Unit Yes, no issues noted  Patient Home Mask Yes  Patient Home Tubing Yes  Auto Titrate  (patient is on home settings)

## 2023-06-21 NOTE — Progress Notes (Signed)
 PHARMACY - TOTAL PARENTERAL NUTRITION CONSULT NOTE   Indication: Prolonged ileus  Patient Measurements: Height: 5' 5.5" (166.4 cm) Weight: 109.5 kg (241 lb 6.5 oz) IBW/kg (Calculated) : 62.65 TPN AdjBW (KG): 75.6 Body mass index is 39.56 kg/m. Usual Weight: 235lb on 05/29/23   Assessment:  83 YO male admitted by the orthopedic service on 5/19 after L hip total arthroplasty. Continued admission given concern for hypotension, tachycardia, and dyspnea with exertion (cardiology following). Per 5/24 KUB, likely ileus. Per MD, patient has not had adequate nutrition x3 days post-op. Pharmacy consulted for TPN management.  Glucose / Insulin : Hx of T2DM on PTA Jardiance  - CBGs increasing, 153-217 in last 24hrs - 15 units of insulin  used in the last 24hrs Electrolytes: K low/decreased to 2.8, CorrCa 9.06 Renal: Scr down 1.19 (BL 1-1.10), BUN 51 Hepatic: albumin  2.8, Tbili 1.5, LFTs and alk phos WNL.  Ammonia 51 (5/27) Intake / Output; MIVF:  - IVF: none, albumin  12.5g x2, Lasix  d/c - Intake: PO 160 mL, NG 120 mL  - Output: urine 1150 mL, NG 1450 mL, stool x8 GI Imaging: - 5/24 DG abd: Diffuse gaseous distention of the colon most consistent with ileus. No evidence of small-bowel obstruction. - 5/26 DG abd: Dilated loops of small bowel up to 5.2 cm in diameter. Gaseous prominence of the colon. Appearance could be from ileus or bowel obstruction. - 5/27 CT a/p: dilated small bowel, R colon, and transverse colon >> could reflect distal SBO or ileus.  GI Surgeries / Procedures: N/A  Central access: 5/26 TPN start date: 5/27  Nutritional Goals: Goal TPN rate is 80 mL/hr (provides 99.8 g of protein and 2047 kcals per day)  RD Assessment: Estimated Needs Total Energy Estimated Needs: 1900-2100 kcals Total Protein Estimated Needs: 95-115 grams Total Fluid Estimated Needs: >/= 1.9L - Pharmacy targeting low end of fluid requirement (1920 mL/day) given patient's history of HF and significant  volume overload post-op  Current Nutrition:  NPO and TPN NGT placed 5/26 - consider NG clamp trial 5/29 or 5/30  Plan:  Now: KCl 10mEq IV x6 runs per MD  At 1800:  Continue TPN at goal rate 20mL/hr Electrolytes in TPN:  Na 50 mEq/L K 70 mEq/L  Ca 5 mEq/L Mg 0 mEq/L  Phos 20 mmol/L  Cl:Ac 1:1 Add standard MVI and trace elements to TPN Increase to moderate q4h SSI and adjust as needed  Monitor TPN labs on Mon/Thurs, and PRN.  IV thiamine  100mg  daily x5 days ordered (thru 5/31)    Kendall Pauls PharmD, BCPS [facility] main pharmacy [phone] 06/21/2023 7:23 AM

## 2023-06-21 NOTE — Progress Notes (Signed)
 [facility] Progress Note  10 Days Post-Op  Subjective: CC:  Reports his abdomen is less distended, multiple BMs overnight.  Sons at bedside.  Objective: Vital signs in last 24 hours: Temp:  [97.2 F (36.2 C)-99.4 F (37.4 C)] 97.2 F (36.2 C) (05/29 0645) Pulse Rate:  [56-140] 91 (05/29 0600) Resp:  [13-37] 15 (05/29 0600) BP: (90-143)/(42-93) 102/56 (05/29 0600) SpO2:  [80 %-100 %] 100 % (05/29 0600) Weight:  [109.5 kg] 109.5 kg (05/29 0442) Last BM Date : 06/20/23  Intake/Output from previous day: 05/28 0701 - 05/29 0700 In: 1467.5 [P.O.:160; I.V.:795.1; NG/GT:120; IV Piggyback:392.4] Out: 2600 [Urine:1150; Emesis/NG output:1450] Intake/Output this shift: No intake/output data recorded.  PE: Gen:  Alert, NAD, pleasant Pulm:  Normal effort on Bovina Abd: Soft, protuberant, moderate distention but improved compared to yesterday, no guarding or rebound tenderness  NG with 1450 mL bilious effluent/24h Skin: warm and dry, no rashes  Psych: A&Ox3   Lab Results:  Recent Labs    06/20/23 0454 06/21/23 0441  WBC 4.4 5.6  HGB 9.4* 8.9*  HCT 28.4* 28.1*  PLT 255 208   BMET Recent Labs    06/20/23 0454 06/21/23 0441  NA 137 137  K 3.1* 2.8*  CL 103 105  CO2 25 25  GLUCOSE 182* 209*  BUN 52* 51*  CREATININE 1.26* 1.19  CALCIUM  8.5* 8.1*   PT/INR No results for input(s): "LABPROT", "INR" in the last 72 hours. CMP     Component Value Date/Time   NA 137 06/21/2023 0441   NA 140 10/31/2022 0913   K 2.8 (L) 06/21/2023 0441   CL 105 06/21/2023 0441   CO2 25 06/21/2023 0441   GLUCOSE 209 (H) 06/21/2023 0441   BUN 51 (H) 06/21/2023 0441   BUN 25 10/31/2022 0913   CREATININE 1.19 06/21/2023 0441   CREATININE 1.11 10/14/2015 0929   CALCIUM  8.1 (L) 06/21/2023 0441   PROT 5.7 (L) 06/21/2023 0441   PROT 5.9 (L) 10/31/2022 0913   ALBUMIN  2.8 (L) 06/21/2023 0441   ALBUMIN  4.0 10/31/2022 0913   AST 14 (L) 06/21/2023 0441   ALT 12 06/21/2023 0441    ALKPHOS 70 06/21/2023 0441   BILITOT 1.5 (H) 06/21/2023 0441   BILITOT 1.2 10/31/2022 0913   GFRNONAA >60 06/21/2023 0441   GFRAA >60 10/21/2018 0726   Lipase     Component Value Date/Time   LIPASE 35 03/09/2022 2158       Studies/Results: DG Abd Portable 1V Result Date: 06/21/2023 CLINICAL DATA:  Small bowel obstruction. EXAM: PORTABLE ABDOMEN - 1 VIEW COMPARISON:  06/20/2023, CT 06/19/2023 FINDINGS: NG tube in the proximal stomach. Access tubing for gastric banding device noted. Posterior lumbar fusion. Dilated loops of small bowel appears slightly improved compared to prior. Loops measure up to 4.9 cm compared to 5.4 cm. Oral contrast not readily identified in the stomach, colon or small bowel. Gas distended ascending colon again noted. Gas in the descending colon. No clear gas in the rectum. IMPRESSION: 1. Slight improvement in small bowel distension. 2. NG tube in stomach. 3. The administered oral contrast through the NG tube on 06/20/2018 is not readily identifiable. Potential dilution effect. Electronically Signed   By: Deboraha Fallow M.D.   On: 06/21/2023 08:39   DG Abd Portable 1V-Small Bowel Obstruction Protocol-initial, 8 hr delay Result Date: 06/20/2023 CLINICAL DATA:  Small bowel obstruction, 8 hour delay. EXAM: PORTABLE ABDOMEN - 1 VIEW COMPARISON:  CT yesterday FINDINGS: No enteric contrast in the colon.  Persistent air and contrast distension of small bowel. There is likely air-filled colon in the right abdomen. Tip of the enteric tube in the stomach. Gastric band is faintly visualized. Cholecystectomy clips in the right upper quadrant. IMPRESSION: No enteric contrast in the colon. Enteric contrast persists within dilated small bowel. Persisting gaseous bowel distension. Recommend 24 hour delayed film. Electronically Signed   By: Chadwick Colonel M.D.   On: 06/20/2023 20:57   CT ABDOMEN PELVIS W CONTRAST Result Date: 06/19/2023 CLINICAL DATA:  Bowel obstruction suspected.  Abdominal distension, minimal stool output. X-ray suggests ileus. Concern for bowel obstruction. EXAM: CT ABDOMEN AND PELVIS WITH CONTRAST TECHNIQUE: Multidetector CT imaging of the abdomen and pelvis was performed using the standard protocol following bolus administration of intravenous contrast. RADIATION DOSE REDUCTION: This exam was performed according to the departmental dose-optimization program which includes automated exposure control, adjustment of the mA and/or kV according to patient size and/or use of iterative reconstruction technique. CONTRAST:  OMNIPAQUE  IOHEXOL  300 MG/ML  SOLN COMPARISON:  03/24/2022, plain film today. FINDINGS: Lower chest: Trace right pleural effusion. Rounded airspace opacity posteriorly in the left lower lobe is new since prior study and measures 2 cm, most likely rounded atelectasis or early infiltrate. Hepatobiliary: No focal liver abnormality is seen. Status post cholecystectomy. No biliary dilatation. Pancreas: No focal abnormality or ductal dilatation. Spleen: No focal abnormality.  Normal size. Adrenals/Urinary Tract: No adrenal abnormality. No focal renal abnormality. No stones or hydronephrosis. Urinary bladder is unremarkable. Stomach/Bowel: NG tube in the stomach. Lap band device in place near the GE junction. Stomach is moderately distended. There is fluid and gaseous distention of small bowel loops into the pelvis. Distal small bowel is decompressed. Right colon and transverse colon are also distended with gas and fluid with a transition point in the distal transverse colon. Left colon is decompressed. Vascular/Lymphatic: Aortic atherosclerosis. No evidence of aneurysm or adenopathy. Reproductive: No visible focal abnormality. Other: Trace ascites adjacent to the liver and spleen.  No free air. Musculoskeletal: No acute bony abnormality. Postoperative changes in the thoracolumbar spine, across the SI joints, and in the hips from bilateral hip replacements.  IMPRESSION: Dilated small bowel into the pelvis with decompressed distal small bowel. Right colon and transverse colon are also dilated with transition to decompressed left colon. Pattern is nonspecific but could reflect distal small bowel obstruction or ileus. Trace right pleural effusion and ascites. Aortic atherosclerosis. Electronically Signed   By: Janeece Mechanic M.D.   On: 06/19/2023 16:58    Anti-infectives: Anti-infectives (From admission, onward)    Start     Dose/Rate Route Frequency Ordered Stop   06/11/23 1830  ceFAZolin  (ANCEF ) IVPB 2g/100 mL premix        2 g 200 mL/hr over 30 Minutes Intravenous Every 6 hours 06/11/23 1651 06/12/23 0747   06/11/23 1030  ceFAZolin  (ANCEF ) IVPB 2g/100 mL premix        2 g 200 mL/hr over 30 Minutes Intravenous On call to O.R. 06/11/23 1025 06/11/23 1300        Assessment/Plan  pSBO vs ileus  -afebrile, vitals stable - CT 5/27 shows small bowel dilation with some decompressed distal small bowel in the right pelvis.  - exam and CT are concerning for possible pSBO, could be due to adhesions from previous abdominal surgery.  - SBO protocol 5/28 >> contrast not visible today. I think he passed it with multiple BMs overnight, also possible it became too dilute. Clinically he shows mild improvement -  more BMs and flatus, less distention.  -  He is not peritonitic and there is no urgent/emergent indication for surgery.  - continue NG to LIWS. Will consider NG clamp trial if he shows more clinical improvement today, but will likely wait until tomorrow given ongoing distention and >1L NGT output in 24h - daily lovenox for DVT ppx   LOS: 7 days   I reviewed nursing notes, Consultant ortho notes, hospitalist notes, last 24 h vitals and pain scores, last 48 h intake and output, last 24 h labs and trends, and last 24 h imaging results.  This care required moderate level of medical decision making.   Michial Akin, PA-C [facility] Please see Amion for pager number during day hours 7:00am-4:30pm

## 2023-06-21 NOTE — Progress Notes (Signed)
 Physical Therapy Treatment Patient Details Name: Carlos Dawson MRN: [id number] DOB: 31-May-1940 Today's Date: 06/21/2023   History of Present Illness patient is a 83 y.o. male who was admitted to the hospital by the orthopedic service on 5/19 after successful left hip total arthroplasty who is now being seen in consultation due to concern for hypotension, tachycardia and dyspnea with exertion. Transfer to SDU 5/22 due to A Fib. PMH:CAD, diastolic CHF, COPD on room air, history of DVT, paroxysmal atrial fibrillation status post Watchman procedure, no longer on anticoagulation    PT Comments  Pt seen in ICU RM # [id number] PT - Cognition Comments: AxO x 3 pleasant and motivated.  Retired Arts development officer.  Lives with Spouse and was IND/Driving. Pt was OOB in recliner. Assisted with amb in hallway required + 2 assist for safety.   General transfer comment: VC's on proper hand placement to "push up" vs "pull up" on walker. Assisted off BSC to recliner.  Pt does well to rise and lower at Min/Mod Assist. General Gait Details: used B platform EVA walker for increased support and promote a great amb distance.  Pt tolerated amb 155 feet x ONE seated rest break half way. Back in room, then assisted to [facility].  Sat x 10 min.  Small amount loose, light brown, watery stool.  Assisted to recliner and positioned to comfort. Pt will need ST Rehab at SNF to address mobility and functional decline prior to safely returning home.     If plan is discharge home, recommend the following: Assistance with cooking/housework;Help with stairs or ramp for entrance;Assist for transportation;A lot of help with walking and/or transfers;A lot of help with bathing/dressing/bathroom   Can travel by private vehicle        Equipment Recommendations  None recommended by PT    Recommendations for Other Services       Precautions / Restrictions Precautions Precautions: Fall Precaution/Restrictions Comments: NPO/NG  tube/TPN/PICC Restrictions Weight Bearing Restrictions Per Provider Order: No LLE Weight Bearing Per Provider Order: Weight bearing as tolerated     Mobility  Bed Mobility               General bed mobility comments: OOB in recliner    Transfers Overall transfer level: Needs assistance Equipment used: Bilateral platform walker Transfers: Sit to/from Stand Sit to Stand: +2 safety/equipment, Mod assist, Min assist           General transfer comment: VC's on proper hand placement to "push up" vs "pull up" on walker. Assisted off BSC to recliner.  Pt does well to rise and lower at Min/Mod Assist.    Ambulation/Gait Ambulation/Gait assistance: Min assist, +2 physical assistance, +2 safety/equipment Gait Distance (Feet): 155 Feet Assistive device: Bilateral platform walker, Blanca Bunch Gait Pattern/deviations: Step-to pattern, Decreased step length - left, Antalgic Gait velocity: decreased     General Gait Details: used B platform EVA walker for increased support and promote a great amb distance.  Pt tolerated amb 155 feet x ONE seated rest break half way.   Stairs             Wheelchair Mobility     Tilt Bed    Modified Rankin (Stroke Patients Only)       Balance  Communication Communication Communication: Impaired Factors Affecting Communication: Hearing impaired  Cognition Arousal: Alert Behavior During Therapy: WFL for tasks assessed/performed   PT - Cognitive impairments: No apparent impairments                       PT - Cognition Comments: AxO x 3 pleasant and motivated.  Retired Arts development officer.  Lives with Spouse and was IND/Driving. Following commands: Intact      Cueing Cueing Techniques: Verbal cues  Exercises      General Comments        Pertinent Vitals/Pain Pain Assessment Pain Assessment: Faces Pain Location: throat (irritation NG tube) Pain Descriptors /  Indicators: Discomfort Pain Intervention(s): Monitored during session    Home Living                          Prior Function            PT Goals (current goals can now be found in the care plan section) Progress towards PT goals: Progressing toward goals    Frequency    7X/week      PT Plan      Co-evaluation              AM-PAC PT "6 Clicks" Mobility   Outcome Measure  Help needed turning from your back to your side while in a flat bed without using bedrails?: A Lot Help needed moving from lying on your back to sitting on the side of a flat bed without using bedrails?: A Lot Help needed moving to and from a bed to a chair (including a wheelchair)?: A Lot Help needed standing up from a chair using your arms (e.g., wheelchair or bedside chair)?: A Lot Help needed to walk in hospital room?: A Lot Help needed climbing 3-5 steps with a railing? : Total 6 Click Score: 11    End of Session Equipment Utilized During Treatment: Gait belt Activity Tolerance: Patient tolerated treatment well Patient left: in chair;with call bell/phone within reach;with family/visitor present;with chair alarm set Nurse Communication: Mobility status PT Visit Diagnosis: Difficulty in walking, not elsewhere classified (R26.2);Muscle weakness (generalized) (M62.81) Pain - Right/Left: Left Pain - part of body: Hip;Leg     Time: 1610-9604 PT Time Calculation (min) (ACUTE ONLY): 33 min  Charges:    $Gait Training: 8-22 mins $Therapeutic Activity: 8-22 mins PT General Charges $$ ACUTE PT VISIT: 1 Visit                     Bess Broody  PTA Acute  Rehabilitation Services Office M-F          [phone]

## 2023-06-21 NOTE — Progress Notes (Signed)
 PROGRESS NOTE   Carlos Dawson  VZD:[id number]    DOB: 04-14-1940    DOA: 06/11/2023  PCP: Tena Feeling, MD   I have briefly reviewed patients previous medical records in [facility].   Brief Hospital Course:  83 year old male with PMH of PAF no longer on anticoagulation, s/p Watchman device 06/15/2022, COPD/asthma not on home O2 prior to admission, OSA on nightly CPAP, anxiety, BPH, prostate cancer, anemia, GERD, DM, chronic diastolic CHF, obesity s/p lap band, HLD, DVT/PE, CAD s/p elective right total hip arthroplasty on 06/11/2023.  Postop course complicated by tachyarrhythmia, hypotension, anemia, ileus versus SBO.  Orthopedics are attending.  TRH/cardiology/GI and general surgery are consulting.  Currently n.p.o./NG tube/PICC line with TPN.   Assessment & Plan:   Postop ileus versus SBO Ileus felt to be more likely. Managing conservatively with bowel rest/n.p.o., NG tube, PICC line with TPN. CT A/P 5/27: Detailed report appreciated.  Concern of distal SBO or ileus. Mobilize.  Minimize opioids.  Trend correct electrolyte abnormalities as needed. [facility] input 5/27 appreciated.  They did not think that a rectal tube would be helpful.  GI signed off 5/28 General surgery consultation appreciated.  S/p small bowel protocol 5/28, follow-up abdominal x-ray without improvement and no contrast in the colon.  KUB this morning is pending Clinically overall feels better, abdomen less distended, multiple BMs in the last 24 hours (8), passing flatus, mobilizing-was up in chair for several hours and ambulated 120 feet with PT yesterday. As discussed with CCS PA, KUB shows improvement with contrast appearing all the way into the colon but awaiting formal report.  If not much NG output in the day, may likely clamp NG tube.  PAF with tachycardia Cardiology following. Post hip surgery, he was noted to be hypotensive, tachycardic and thought to be in A-fib with RVR. S/p ablation 04/2017 and  Watchman device 05/2022. Currently on IV metoprolol  5 mg Q6 hourly, rate reasonably controlled.  However due to soft blood pressure with SBP in the 80s, reduced IV metoprolol  to 2.5 Mg every 6 hourly with holding parameters. Was on IV amiodarone earlier but not on it now.  May need it again if rates increase. Postop left hip DVT prophylaxis Xarelto  10 Mg daily was held yesterday and transition to subcutaneous Lovenox for DVT prophylaxis due to n.p.o. status. As per cardiology, his telemetry mostly shows normal sinus rhythm with frequent PACs with a baseline of RBBB and some runs of A-fib.  They did not recommend full anticoagulation.  Acute on chronic diastolic CHF/anasarca TTE 5/23: LVEF 70-75% Remains with significant volume overload/anasarca-this is likely multifactorial due to hypoalbuminemia, IV fluid resuscitation and CHF.  Volume status somewhat better. UO 1150 mL in last 24 hours.  -8.2 L thus far. Due to hypotension, rising BUN, held Lasix  for today, will give IV albumin  12.5 g every 6 hours x 2 and monitor closely.  Hypotension SBP's in the mid 80s on 5/29 morning.  Held IV Lasix .  Decreased IV scheduled metoprolol  for his PAF with holding parameters and giving IV albumin  Monitor closely.  CAD s/p PCI As per cardiology, stress PET on 10/24 was a high risk study.  They recommend outpatient follow-up to consider ischemic evaluation Currently on rectal aspirin  300 Mg daily while NPO.  Continue beta-blockers.  Acute respiratory failure with hypoxia Suspect multifactorial As per chart review, previously had oxygen  saturations of 87% on room air Currently saturating in the high 90s on 2 L/min [place] oxygen .  Continue O2  support and wean oxygen  off as tolerated  Macrocytic anemia History of iron  deficiency anemia Hemoglobin dropped from 11.2 on 5/6-7.9 on 5/23.  S/p 1 unit of PRBC on 5/23. Hemoglobin stable in the low to mid 9 g range.  Attempt to keep hemoglobin >8 g per DL. Hemoglobin  relatively stable.  Slight drop from 9.4-8.9.  Hypokalemia Replace aggressively in IV and in TPN per pharmacy.  Magnesium  2.4. Attempt to keep K >4 and mag >2.  S/p left total hip arthroplasty on 5/19 Management per primary service. Not much pain.  Mobilizing with PT.  History of thoracic aortic aneurysm Outpatient follow-up  History of PE Per home med rec review, did not appear to be on anticoagulation PTA. Continue Lovenox for postop DVT prophylaxis while NPO.  This can be switched back to Xarelto  when tolerating p.o. well.  BPH/history of prostate cancer Resume home meds when able.  Obesity s/p lap banding Noted  OSA on nightly CPAP Continue as tolerated. Unable to use CPAP due to NG tube  Delirium Likely multifactorial.  Delirium precautions.  Minimize opioids/sedatives.  Family at bedside would be helpful. Do not think that the minimally elevated ammonia 51 on 5/27 is of significance. TSH normal. Appears to be better.  Type II DM A1c 5.7 on 5/6 Remains on NovoLog  SSI.  CBGs in the low 200s since last night.  Monitor closely and adjust insulins as needed.  Elevated creatinine Creatinine of 0.98 on 5/6, meeting for stage II CKD Since hospital admission, creatinine has peaked to 1.26. Does not meet criteria for AKI. Monitor BMP closely while on TPN, IV Lasix  etc. Minimize nephrotoxic agents. Creatinine has normalized.  Holding Lasix , IV albumin  as above.  Body mass index is 39.56 kg/m./Obesity Complicates care.   DVT prophylaxis: Place TED hose Start: 06/11/23 1651 SCDs Start: 06/11/23 1433     Code Status: Full Code:  Family Communication: Discussed extensively with patient's 2 sons at bedside.  Discussed with CCS APP. Disposition:  Status is: Inpatient Remains inpatient appropriate because: Remains quite ill as noted above with NG tube, TPN, n.p.o., ongoing ileus versus SBO requiring multiple consultants input.  Not medically stable for DC.      Consultants:   [facility] Card[facility] General Surgery  Procedures:   As noted above. PICC line NGT  Subjective:  Feels that abdomen is softer, some discomfort but not painful.  Mild left-sided chest pain, not elaborating well, appears atypical, states that when he gets up.  Currently without chest pain.  Dyspnea on exertion, while getting up.  Per nursing, multiple BMs overnight, 8 BMs charted in the last 24 hours of which 6 were overnight, watery with varying volumes.  Passing flatus.  Objective:   Vitals:   06/21/23 0442 06/21/23 0500 06/21/23 0600 06/21/23 0645  BP:   (!) 102/56   Pulse:  (!) 58 91   Resp:  17 15   Temp:    (!) 97.2 F (36.2 C)  TempSrc:    Axillary  SpO2:  100% 100%   Weight: 109.5 kg     Height:        General exam: Elderly male, moderately built and obese lying comfortably propped up in bed without distress.  Oral mucosa with borderline hydration.  However mouth breather. Respiratory system: Reduced breath sounds in the bases with occasional basal crackles.  Rest of lung fields clear.  No increased work of breathing.  On 2 L/min [place] oxygen . Cardiovascular system: S1 and S2 heard, RRR.  No JVD.  No pedal edema.  Trace bilateral lower thigh edema.  Telemetry personally reviewed: Sinus rhythm with very frequent PACs, underlying BBB morphology.  Did not appreciate A-fib.  Rate controlled. Gastrointestinal system: Abdomen is less distended than yesterday, soft, not tense or tender.  Has palpable Lap-Band in the lower epigastric region.  No other surgical scars noted.  Few bowel sounds heard.  No organomegaly or masses appreciated. Central nervous system: Alert and oriented x 3.  Answers all questions appropriately but difficult to understand his speech likely due to dry mouth. No focal neurological deficits. Extremities: Symmetric 5 x 5 power. Skin: No rashes, lesions or ulcers Psychiatry: Judgement and insight appear normal. Mood & affect appropriate.      Data Reviewed:   I have personally reviewed following labs and imaging studies   CBC: Recent Labs  Lab 06/19/23 0405 06/20/23 0454 06/21/23 0441  WBC 8.2 4.4 5.6  HGB 9.2* 9.4* 8.9*  HCT 28.9* 28.4* 28.1*  MCV 106.3* 106.4* 109.3*  PLT 223 255 208    Basic Metabolic Panel: Recent Labs  Lab 06/17/23 0304 06/18/23 0310 06/19/23 0405 06/20/23 0454 06/21/23 0441  NA 135 132* 133* 137 137  K 3.7 3.2* 3.1* 3.1* 2.8*  CL 107 102 101 103 105  CO2 21* 19* 24 25 25   GLUCOSE 130* 193* 131* 182* 209*  BUN 30* 36* 44* 52* 51*  CREATININE 1.03 1.26* 1.22 1.26* 1.19  CALCIUM  8.3* 8.4* 8.0* 8.5* 8.1*  MG 2.3 2.2 2.1 2.4 2.4  PHOS  --   --  3.2 2.7 2.8    Liver Function Tests: Recent Labs  Lab 06/19/23 0405 06/20/23 0454 06/21/23 0441  AST 17 15 14*  ALT 14 13 12   ALKPHOS 68 75 70  BILITOT 1.6* 1.5* 1.5*  PROT 5.4* 5.6* 5.7*  ALBUMIN  2.7* 3.0* 2.8*    CBG: Recent Labs  Lab 06/20/23 1946 06/20/23 2318 06/21/23 0440  GLUCAP 217* 203* 207*    Microbiology Studies:   Recent Results (from the past 240 hours)  MRSA Next Gen by PCR, Nasal     Status: None   Collection Time: 06/14/23  6:32 PM   Specimen: Nasal Mucosa; Nasal Swab  Result Value Ref Range Status   MRSA by PCR Next Gen NOT DETECTED NOT DETECTED Final    Comment: (NOTE) The GeneXpert MRSA Assay (FDA approved for NASAL specimens only), is one component of a comprehensive MRSA colonization surveillance program. It is not intended to diagnose MRSA infection nor to guide or monitor treatment for MRSA infections. Test performance is not FDA approved in patients less than 53 years old. Performed at [facility], [address] [place], [place] [address]     Radiology Studies:  DG Abd Portable 1V-Small Bowel Obstruction Protocol-initial, 8 hr delay Result Date: 06/20/2023 CLINICAL DATA:  Small bowel obstruction, 8 hour delay. EXAM: PORTABLE ABDOMEN - 1 VIEW COMPARISON:  CT  yesterday FINDINGS: No enteric contrast in the colon. Persistent air and contrast distension of small bowel. There is likely air-filled colon in the right abdomen. Tip of the enteric tube in the stomach. Gastric band is faintly visualized. Cholecystectomy clips in the right upper quadrant. IMPRESSION: No enteric contrast in the colon. Enteric contrast persists within dilated small bowel. Persisting gaseous bowel distension. Recommend 24 hour delayed film. Electronically Signed   By: Chadwick Colonel M.D.   On: 06/20/2023 20:57   CT ABDOMEN PELVIS W CONTRAST Result Date: 06/19/2023 CLINICAL DATA:  Bowel  obstruction suspected. Abdominal distension, minimal stool output. X-ray suggests ileus. Concern for bowel obstruction. EXAM: CT ABDOMEN AND PELVIS WITH CONTRAST TECHNIQUE: Multidetector CT imaging of the abdomen and pelvis was performed using the standard protocol following bolus administration of intravenous contrast. RADIATION DOSE REDUCTION: This exam was performed according to the departmental dose-optimization program which includes automated exposure control, adjustment of the mA and/or kV according to patient size and/or use of iterative reconstruction technique. CONTRAST:  OMNIPAQUE  IOHEXOL  300 MG/ML  SOLN COMPARISON:  03/24/2022, plain film today. FINDINGS: Lower chest: Trace right pleural effusion. Rounded airspace opacity posteriorly in the left lower lobe is new since prior study and measures 2 cm, most likely rounded atelectasis or early infiltrate. Hepatobiliary: No focal liver abnormality is seen. Status post cholecystectomy. No biliary dilatation. Pancreas: No focal abnormality or ductal dilatation. Spleen: No focal abnormality.  Normal size. Adrenals/Urinary Tract: No adrenal abnormality. No focal renal abnormality. No stones or hydronephrosis. Urinary bladder is unremarkable. Stomach/Bowel: NG tube in the stomach. Lap band device in place near the GE junction. Stomach is moderately  distended. There is fluid and gaseous distention of small bowel loops into the pelvis. Distal small bowel is decompressed. Right colon and transverse colon are also distended with gas and fluid with a transition point in the distal transverse colon. Left colon is decompressed. Vascular/Lymphatic: Aortic atherosclerosis. No evidence of aneurysm or adenopathy. Reproductive: No visible focal abnormality. Other: Trace ascites adjacent to the liver and spleen.  No free air. Musculoskeletal: No acute bony abnormality. Postoperative changes in the thoracolumbar spine, across the SI joints, and in the hips from bilateral hip replacements. IMPRESSION: Dilated small bowel into the pelvis with decompressed distal small bowel. Right colon and transverse colon are also dilated with transition to decompressed left colon. Pattern is nonspecific but could reflect distal small bowel obstruction or ileus. Trace right pleural effusion and ascites. Aortic atherosclerosis. Electronically Signed   By: Janeece Mechanic M.D.   On: 06/19/2023 16:58    Scheduled Meds:    aspirin   300 mg Rectal Daily   Chlorhexidine  Gluconate Cloth  6 each Topical Daily   enoxaparin  (LOVENOX ) injection  55 mg Subcutaneous Q24H   [START ON 06/22/2023] furosemide   40 mg Intravenous Daily   insulin  aspart  0-9 Units Subcutaneous Q4H   metoprolol  tartrate  2.5 mg Intravenous Q6H   pantoprazole  (PROTONIX ) IV  40 mg Intravenous Q24H   sennosides  10 mL Per Tube BID   sodium chloride  flush  10-40 mL Intracatheter Q12H   tamsulosin   0.4 mg Oral QPM   thiamine  (VITAMIN B1) injection  100 mg Intravenous Daily    Continuous Infusions:    acetaminophen      albumin  human     potassium chloride  10 mEq (06/21/23 0648)   TPN ADULT (ION) 80 mL/hr at 06/20/23 1812     LOS: 7 days     Aubrey Blas, MD,  FACP, [facility], [facility], [facility]   [facility] [place]      To contact the attending provider between 7A-7P or  the covering provider during after hours 7P-7A, please log into the web site [email or web address] and access using universal  password for that web site. If you do not have the password, please call the hospital operator.  06/21/2023, 7:53 AM

## 2023-06-21 NOTE — Plan of Care (Signed)
  Problem: Education: Goal: Knowledge of General Education information will improve Description: Including pain rating scale, medication(s)/side effects and non-pharmacologic comfort measures Outcome: Progressing   Problem: Health Behavior/Discharge Planning: Goal: Ability to manage health-related needs will improve Outcome: Progressing   Problem: Clinical Measurements: Goal: Ability to maintain clinical measurements within normal limits will improve Outcome: Progressing Goal: Will remain free from infection Outcome: Progressing Goal: Diagnostic test results will improve Outcome: Progressing Goal: Respiratory complications will improve Outcome: Progressing Goal: Cardiovascular complication will be avoided Outcome: Progressing   Problem: Activity: Goal: Risk for activity intolerance will decrease Outcome: Progressing   Problem: Coping: Goal: Level of anxiety will decrease Outcome: Progressing   Problem: Pain Managment: Goal: General experience of comfort will improve and/or be controlled Outcome: Progressing   Problem: Safety: Goal: Ability to remain free from injury will improve Outcome: Progressing   Problem: Skin Integrity: Goal: Risk for impaired skin integrity will decrease Outcome: Progressing   Problem: Education: Goal: Knowledge of General Education information will improve Description: Including pain rating scale, medication(s)/side effects and non-pharmacologic comfort measures Outcome: Progressing   Problem: Health Behavior/Discharge Planning: Goal: Ability to manage health-related needs will improve Outcome: Progressing   Problem: Clinical Measurements: Goal: Ability to maintain clinical measurements within normal limits will improve Outcome: Progressing Goal: Will remain free from infection Outcome: Progressing Goal: Diagnostic test results will improve Outcome: Progressing Goal: Respiratory complications will improve Outcome: Progressing Goal:  Cardiovascular complication will be avoided Outcome: Progressing   Problem: Activity: Goal: Risk for activity intolerance will decrease Outcome: Progressing   Problem: Coping: Goal: Level of anxiety will decrease Outcome: Progressing   Problem: Pain Managment: Goal: General experience of comfort will improve and/or be controlled Outcome: Progressing   Problem: Safety: Goal: Ability to remain free from injury will improve Outcome: Progressing   Problem: Skin Integrity: Goal: Risk for impaired skin integrity will decrease Outcome: Progressing   Problem: Education: Goal: Knowledge of the prescribed therapeutic regimen will improve Outcome: Progressing Goal: Understanding of discharge needs will improve Outcome: Progressing   Problem: Activity: Goal: Ability to avoid complications of mobility impairment will improve Outcome: Progressing Goal: Ability to tolerate increased activity will improve Outcome: Progressing   Problem: Clinical Measurements: Goal: Postoperative complications will be avoided or minimized Outcome: Progressing   Problem: Pain Management: Goal: Pain level will decrease with appropriate interventions Outcome: Progressing   Problem: Skin Integrity: Goal: Will show signs of wound healing Outcome: Progressing   Problem: Education: Goal: Ability to describe self-care measures that may prevent or decrease complications (Diabetes Survival Skills Education) will improve Outcome: Progressing Goal: Individualized Educational Video(s) Outcome: Progressing   Problem: Coping: Goal: Ability to adjust to condition or change in health will improve Outcome: Progressing   Problem: Fluid Volume: Goal: Ability to maintain a balanced intake and output will improve Outcome: Progressing   Problem: Health Behavior/Discharge Planning: Goal: Ability to identify and utilize available resources and services will improve Outcome: Progressing Goal: Ability to manage  health-related needs will improve Outcome: Progressing   Problem: Metabolic: Goal: Ability to maintain appropriate glucose levels will improve Outcome: Progressing   Problem: Nutritional: Goal: Progress toward achieving an optimal weight will improve Outcome: Progressing   Problem: Skin Integrity: Goal: Risk for impaired skin integrity will decrease Outcome: Progressing   Problem: Tissue Perfusion: Goal: Adequacy of tissue perfusion will improve Outcome: Progressing

## 2023-06-21 NOTE — Progress Notes (Signed)
 Rounding Note    Patient Name: Carlos Dawson Date of Encounter: 06/21/2023   [facility] Cardiologist: Aviannah Castoro [name], MD   Subjective   Making progress sitting in chair and ambulating NG tube still in  Some low BP  Inpatient Medications    Scheduled Meds:  aspirin   300 mg Rectal Daily   Chlorhexidine  Gluconate Cloth  6 each Topical Daily   enoxaparin  (LOVENOX ) injection  55 mg Subcutaneous Q24H   [START ON 06/22/2023] furosemide   40 mg Intravenous Daily   insulin  aspart  0-9 Units Subcutaneous Q4H   metoprolol  tartrate  2.5 mg Intravenous Q6H   pantoprazole  (PROTONIX ) IV  40 mg Intravenous Q24H   sennosides  10 mL Per Tube BID   sodium chloride  flush  10-40 mL Intracatheter Q12H   tamsulosin   0.4 mg Oral QPM   thiamine  (VITAMIN B1) injection  100 mg Intravenous Daily   Continuous Infusions:  acetaminophen      albumin  human     potassium chloride  10 mEq (06/21/23 0816)   TPN ADULT (ION) 80 mL/hr at 06/20/23 1812   PRN Meds: acetaminophen , albuterol , alum & mag hydroxide-simeth, budesonide -formoterol , cyclobenzaprine , HYDROmorphone  (DILAUDID ) injection, lip balm, magnesium  citrate, menthol -cetylpyridinium **OR** phenol, nitroGLYCERIN , ondansetron  **OR** ondansetron  (ZOFRAN ) IV, mouth rinse, polyethylene glycol, simethicone , sodium chloride , sodium chloride  flush, tiotropium   Vital Signs    Vitals:   06/21/23 0442 06/21/23 0500 06/21/23 0600 06/21/23 0645  BP:   (!) 102/56   Pulse:  (!) 58 91   Resp:  17 15   Temp:    (!) 97.2 F (36.2 C)  TempSrc:    Axillary  SpO2:  100% 100%   Weight: 109.5 kg     Height:        Intake/Output Summary (Last 24 hours) at 06/21/2023 0839 Last data filed at 06/21/2023 0645 Gross per 24 hour  Intake 911.52 ml  Output 2600 ml  Net -1688.48 ml      06/21/2023    4:42 AM 06/20/2023    5:00 AM 06/19/2023    4:35 AM  Last 3 Weights  Weight (lbs) 241 lb 6.5 oz 240 lb 11.9 oz 241 lb 2.9 oz  Weight (kg) 109.5 kg 109.2 kg  109.4 kg      Telemetry    Cannot r/o PAF   ECG    No new ECG- Personally Reviewed  Physical Exam   More alert  Volume overload plus one bilateral edema Distended abdomen but improved NG tube  Decrease BS base No murmur  Post left hip surgery dressing dry   Labs    High Sensitivity Troponin:  No results for input(s): "TROPONINIHS" in the last 720 hours.   Chemistry Recent Labs  Lab 06/19/23 0405 06/20/23 0454 06/21/23 0441  NA 133* 137 137  K 3.1* 3.1* 2.8*  CL 101 103 105  CO2 24 25 25   GLUCOSE 131* 182* 209*  BUN 44* 52* 51*  CREATININE 1.22 1.26* 1.19  CALCIUM  8.0* 8.5* 8.1*  MG 2.1 2.4 2.4  PROT 5.4* 5.6* 5.7*  ALBUMIN  2.7* 3.0* 2.8*  AST 17 15 14*  ALT 14 13 12   ALKPHOS 68 75 70  BILITOT 1.6* 1.5* 1.5*  GFRNONAA 60* 57* >60  ANIONGAP 8 9 7     Lipids No results for input(s): "CHOL", "TRIG", "HDL", "LABVLDL", "LDLCALC", "CHOLHDL" in the last 168 hours.  Hematology Recent Labs  Lab 06/19/23 0405 06/20/23 0454 06/21/23 0441  WBC 8.2 4.4 5.6  RBC 2.72* 2.67* 2.57*  HGB 9.2* 9.4* 8.9*  HCT 28.9* 28.4* 28.1*  MCV 106.3* 106.4* 109.3*  MCH 33.8 35.2* 34.6*  MCHC 31.8 33.1 31.7  RDW 16.6* 16.9* 17.3*  PLT 223 255 208   Thyroid   Recent Labs  Lab 06/19/23 1501  TSH 1.800    BNP Recent Labs  Lab 06/16/23 0314 06/19/23 1742  BNP 342.4* 300.9*    DDimer No results for input(s): "DDIMER" in the last 168 hours.   Radiology    DG Abd Portable 1V-Small Bowel Obstruction Protocol-initial, 8 hr delay Result Date: 06/20/2023 CLINICAL DATA:  Small bowel obstruction, 8 hour delay. EXAM: PORTABLE ABDOMEN - 1 VIEW COMPARISON:  CT yesterday FINDINGS: No enteric contrast in the colon. Persistent air and contrast distension of small bowel. There is likely air-filled colon in the right abdomen. Tip of the enteric tube in the stomach. Gastric band is faintly visualized. Cholecystectomy clips in the right upper quadrant. IMPRESSION: No enteric contrast in the  colon. Enteric contrast persists within dilated small bowel. Persisting gaseous bowel distension. Recommend 24 hour delayed film. Electronically Signed   By: Chadwick Colonel M.D.   On: 06/20/2023 20:57   CT ABDOMEN PELVIS W CONTRAST Result Date: 06/19/2023 CLINICAL DATA:  Bowel obstruction suspected. Abdominal distension, minimal stool output. X-ray suggests ileus. Concern for bowel obstruction. EXAM: CT ABDOMEN AND PELVIS WITH CONTRAST TECHNIQUE: Multidetector CT imaging of the abdomen and pelvis was performed using the standard protocol following bolus administration of intravenous contrast. RADIATION DOSE REDUCTION: This exam was performed according to the departmental dose-optimization program which includes automated exposure control, adjustment of the mA and/or kV according to patient size and/or use of iterative reconstruction technique. CONTRAST:  OMNIPAQUE  IOHEXOL  300 MG/ML  SOLN COMPARISON:  03/24/2022, plain film today. FINDINGS: Lower chest: Trace right pleural effusion. Rounded airspace opacity posteriorly in the left lower lobe is new since prior study and measures 2 cm, most likely rounded atelectasis or early infiltrate. Hepatobiliary: No focal liver abnormality is seen. Status post cholecystectomy. No biliary dilatation. Pancreas: No focal abnormality or ductal dilatation. Spleen: No focal abnormality.  Normal size. Adrenals/Urinary Tract: No adrenal abnormality. No focal renal abnormality. No stones or hydronephrosis. Urinary bladder is unremarkable. Stomach/Bowel: NG tube in the stomach. Lap band device in place near the GE junction. Stomach is moderately distended. There is fluid and gaseous distention of small bowel loops into the pelvis. Distal small bowel is decompressed. Right colon and transverse colon are also distended with gas and fluid with a transition point in the distal transverse colon. Left colon is decompressed. Vascular/Lymphatic: Aortic atherosclerosis. No evidence of  aneurysm or adenopathy. Reproductive: No visible focal abnormality. Other: Trace ascites adjacent to the liver and spleen.  No free air. Musculoskeletal: No acute bony abnormality. Postoperative changes in the thoracolumbar spine, across the SI joints, and in the hips from bilateral hip replacements. IMPRESSION: Dilated small bowel into the pelvis with decompressed distal small bowel. Right colon and transverse colon are also dilated with transition to decompressed left colon. Pattern is nonspecific but could reflect distal small bowel obstruction or ileus. Trace right pleural effusion and ascites. Aortic atherosclerosis. Electronically Signed   By: Janeece Mechanic M.D.   On: 06/19/2023 16:58    Cardiac Studies     Patient Profile     83 y.o. male  history of atrial fibrillation status post ablation in 2019 and Watchman in 05/2022 (due to hematuria), CAD status post PCI in 2012, chronic diastolic heart failure, PE, OSA, T2DM, thoracic  aortic aneurysm who we are consulted for evaluation of possible atrial fibrillation   Assessment & Plan    Paroxysmal atrial fibrillation: Status post ablation 04/2017 and Watchman 05/2022.   - His telemetry shows mostly NSR with PAC;s may look like NSVT but he has a baseline RBBB  - Lack of PO intake and ileus complicates Rx - PRN iv inderal dose decreased by primary service  - supplement K 2.8 this am - ECG 5/28 confirms SR with frequent PAC;s  - Has Watchman device so only needs DVT Pioneer heparin  or lovenox   Acute on chronic diastolic heart failure: Echocardiogram 5/23 shows EF 70 to 75%, normal RV function.  Significantly volume overloaded after his surgery - He has diuresed well,  - Hold lasix  for now given need for albumin  and low BP -Supplement K 2.8 mild azotemia BUN 51 Cr 1.19 should improve holding lasix   CAD: Status post BMS to OM2 in 2012.  Cardiac catheterization in 2019 showed patent stents.  He denies any chest pain.  Notably, he had a stress PET on  10/24 which was high risk study due to marked reduction in myocardial blood flow reserve (though question accuracy in setting of prior PCI), can follow-up as outpatient and consider further ischemic evaluation -Restarted ASA 81 mg daily given history of coronary stenting.  Now unable to take PO, will start rectal ASA  Anemia: Following hip surgery, hemoglobin dropped from 11.2 on 5/6 to 7.9 on 5/23.  Would transfuse for goal hemoglobin greater than 8 given cardiac history.  Received 1 unit PRBCs on 5/23, hemoglobin 8.9 today  Ileus: clinically improved NG tube KUB with slight improvement by KUB  For questions or updates, please contact [facility] Please consult [email or web address] for contact info under        Signed, Janelle Mediate, MD  06/21/2023, 8:39 AM

## 2023-06-22 DIAGNOSIS — E876 Hypokalemia: Secondary | ICD-10-CM | POA: Diagnosis not present

## 2023-06-22 DIAGNOSIS — I959 Hypotension, unspecified: Secondary | ICD-10-CM | POA: Diagnosis not present

## 2023-06-22 DIAGNOSIS — K567 Ileus, unspecified: Secondary | ICD-10-CM | POA: Diagnosis not present

## 2023-06-22 LAB — CBC
HCT: 27.4 % — ABNORMAL LOW (ref 39.0–52.0)
Hemoglobin: 9.2 g/dL — ABNORMAL LOW (ref 13.0–17.0)
MCH: 37.7 pg — ABNORMAL HIGH (ref 26.0–34.0)
MCHC: 33.6 g/dL (ref 30.0–36.0)
MCV: 112.3 fL — ABNORMAL HIGH (ref 80.0–100.0)
Platelets: 204 10*3/uL (ref 150–400)
RBC: 2.44 MIL/uL — ABNORMAL LOW (ref 4.22–5.81)
RDW: 17.5 % — ABNORMAL HIGH (ref 11.5–15.5)
WBC: 9.5 10*3/uL (ref 4.0–10.5)
nRBC: 1.3 % — ABNORMAL HIGH (ref 0.0–0.2)

## 2023-06-22 LAB — GLUCOSE, CAPILLARY
Glucose-Capillary: 159 mg/dL — ABNORMAL HIGH (ref 70–99)
Glucose-Capillary: 159 mg/dL — ABNORMAL HIGH (ref 70–99)
Glucose-Capillary: 177 mg/dL — ABNORMAL HIGH (ref 70–99)
Glucose-Capillary: 178 mg/dL — ABNORMAL HIGH (ref 70–99)
Glucose-Capillary: 184 mg/dL — ABNORMAL HIGH (ref 70–99)
Glucose-Capillary: 209 mg/dL — ABNORMAL HIGH (ref 70–99)

## 2023-06-22 LAB — BASIC METABOLIC PANEL WITH GFR
Anion gap: 7 (ref 5–15)
BUN: 38 mg/dL — ABNORMAL HIGH (ref 8–23)
CO2: 25 mmol/L (ref 22–32)
Calcium: 8.6 mg/dL — ABNORMAL LOW (ref 8.9–10.3)
Chloride: 112 mmol/L — ABNORMAL HIGH (ref 98–111)
Creatinine, Ser: 1.08 mg/dL (ref 0.61–1.24)
GFR, Estimated: >60 mL/min (ref >60)
Glucose, Bld: 194 mg/dL — ABNORMAL HIGH (ref 70–99)
Potassium: 3.1 mmol/L — ABNORMAL LOW (ref 3.5–5.1)
Sodium: 144 mmol/L (ref 135–145)

## 2023-06-22 LAB — MAGNESIUM: Magnesium: 2.1 mg/dL (ref 1.7–2.4)

## 2023-06-22 LAB — PHOSPHORUS: Phosphorus: 2.8 mg/dL (ref 2.5–4.6)

## 2023-06-22 MED ORDER — SENNA 8.6 MG PO TABS
1.0000 | ORAL_TABLET | Freq: Two times a day (BID) | ORAL | Status: DC
  Administered 2023-06-23 – 2023-06-24 (×4): 8.6 mg via ORAL
  Filled 2023-06-22 (×5): qty 1

## 2023-06-22 MED ORDER — TRAVASOL 10 % IV SOLN
INTRAVENOUS | Status: AC
  Filled 2023-06-22: qty 998.4

## 2023-06-22 MED ORDER — POTASSIUM CHLORIDE 10 MEQ/100ML IV SOLN
10.0000 meq | INTRAVENOUS | Status: AC
  Administered 2023-06-22 (×5): 10 meq via INTRAVENOUS
  Filled 2023-06-22 (×5): qty 100

## 2023-06-22 MED ORDER — ACETAMINOPHEN 500 MG PO TABS
1000.0000 mg | ORAL_TABLET | Freq: Four times a day (QID) | ORAL | Status: DC | PRN
  Administered 2023-06-23 – 2023-06-24 (×2): 1000 mg via ORAL
  Filled 2023-06-22 (×3): qty 2

## 2023-06-22 NOTE — Progress Notes (Signed)
 Subjective: 11 Days Post-Op Procedure(s) (LRB): ARTHROPLASTY, HIP, TOTAL, ANTERIOR APPROACH (Left) Patient seen in rounds by Dr. France Ina. Patient feels he is overall improving. As far as the hip, he notes some continued muscle spasms and soreness that extends to the lower back.  Objective: Vital signs in last 24 hours: Temp:  [98.3 F (36.8 C)-98.4 F (36.9 C)] 98.3 F (36.8 C) (05/30 0354) Pulse Rate:  [39-139] 92 (05/30 0800) Resp:  [15-27] 21 (05/30 0800) BP: (92-139)/(33-79) 139/79 (05/30 0800) SpO2:  [90 %-100 %] 92 % (05/30 0800) Weight:  [106.7 kg] 106.7 kg (05/30 0349)  Intake/Output from previous day:  Intake/Output Summary (Last 24 hours) at 06/22/2023 0814 Last data filed at 06/22/2023 0600 Gross per 24 hour  Intake 1004.49 ml  Output 400 ml  Net 604.49 ml    Intake/Output this shift: No intake/output data recorded.  Labs: Recent Labs    06/20/23 0454 06/21/23 0441 06/22/23 0643  HGB 9.4* 8.9* 9.2*   Recent Labs    06/21/23 0441 06/22/23 0643  WBC 5.6 9.5  RBC 2.57* 2.44*  HCT 28.1* 27.4*  PLT 208 204   Recent Labs    06/21/23 0441 06/22/23 0643  NA 137 144  K 2.8* 3.1*  CL 105 112*  CO2 25 25  BUN 51* 38*  CREATININE 1.19 1.08  GLUCOSE 209* 194*  CALCIUM  8.1* 8.6*   No results for input(s): "LABPT", "INR" in the last 72 hours.  Exam: General - Patient is Alert Extremity - Neurologically intact Neurovascular intact Sensation intact distally Dorsiflexion/Plantar flexion intact Dressing/Incision - clean, dry, no drainage Motor Function - intact, moving foot and toes well on exam.  Past Medical History:  Diagnosis Date   Anticoagulant long-term use    Aortic root enlargement (HCC)    Ascending aortic aneurysm (HCC)    recent scan in October 2012 showing no change; followed by Dr. Nicanor Barge   ASCVD (arteriosclerotic cardiovascular disease)    Prior BMS to the 2nd OM in September 2012; with repeat cath in October showing patency    CAD (coronary artery disease)    a. s/p BMS to 2nd OM in Sept 2012; b. LexiScan  Myoview  (12/2012):  Inf infarct; bowel and motion artifact make study difficult to interpret; no ischemia; not gated; Low Risk   CHF (congestive heart failure) (HCC)    no recent issues 10/13/14   Chronic back pain    "all over my back" (05/11/2017)   Colonic polyp    Contact lens/glasses fitting    COPD (chronic obstructive pulmonary disease) (HCC)    Diastolic dysfunction    DVT (deep venous thrombosis) (HCC)    ?LLE   GERD (gastroesophageal reflux disease)    Hearing loss    Hearing loss    more so on left   Hemorrhoids    History of stomach ulcers    Hypertension    IBS (irritable bowel syndrome)    LVH (left ventricular hypertrophy)    OA (osteoarthritis)    "all over" (05/11/2017)   Obesities, morbid (HCC)    OSA (obstructive sleep apnea)    PSG 03/30/97 AHI 21, BPAP 13/9   OSA on CPAP    PAF (paroxysmal atrial fibrillation) (HCC)    a. on Xarelto  b. s/p DCCV in 08/2016; b. Tikosyn  failed 04/16/17 with plans for Multaq  and possible Afib ablation with Dr. Nunzio Belch   Pneumonia    'several times" (05/11/2017)   Presence of Watchman left atrial appendage closure device 06/15/2022  24mm Watchman FLX Pro placed by Dr. Marven Slimmer   Prostate CA [facility])    Oncologist  DR. Cristal Don [facility] dx 09/24/14, undetermined tx   prostate; S/P "radiation and hormone injections"   Pulmonary embolism (HCC) 2008   "both lungs"   SOB (shortness of breath)    on excertion   Thoracic aortic aneurysm (HCC)    Aortic Size Index=     5.0    /Body surface area is 2.43 meters squared. = 2.05  < 2.75 cm/m2      4% risk per year 2.75 to 4.25          8% risk per year > 4.25 cm/m2    20% risk per year   Stable aneurysmal dilation of the ascending aorta with maximum AP diameter of 4.8 cm. Stable area of narrowing of the proximal most portion of the descending aorta measuring 2 cm., previously identified as an area of coarctation. No  evidence of aortic dissection.  Coronary artery disease.  Normal appearance of the lungs.   Electronically Signed   By: Dobrinka  Dimitrova M.D.   On: 10/01/2014 08:50     Type II diabetes mellitus (HCC)    metphormin, average 154 dx 2017    Assessment/Plan: 11 Days Post-Op Procedure(s) (LRB): ARTHROPLASTY, HIP, TOTAL, ANTERIOR APPROACH (Left) Principal Problem:   Ileus (HCC) Active Problems:   OA (osteoarthritis) of hip   Paroxysmal atrial fibrillation (HCC)   Hypotension   SOB (shortness of breath)   Anticoagulation management encounter   Primary osteoarthritis of left hip   Status post hip replacement   NSVT (nonsustained ventricular tachycardia) (HCC)   Acute blood loss anemia  Estimated body mass index is 38.55 kg/m as calculated from the following:   Height as of this encounter: 5' 5.5" (1.664 m).   Weight as of this encounter: 106.7 kg.  DVT Prophylaxis - heparin  Weight-bearing as tolerated.  Overall condition much improved. Continue physical therapy.  Alfreda Imus, PA-C Orthopedic Surgery 06/22/2023, 8:14 AM

## 2023-06-22 NOTE — Progress Notes (Signed)
 Assessment & Plan: Partial SBO vs ileus   - minimal out of NG overnight, multiple BM's  - clamp NG this AM and allow CLD  - if tolerated, will remove NG later today        Carlos Billow, MD [facility] practice Office: [phone]        Chief Complaint: Partial SBO vs ileus  Subjective: Patient up in bed, family at bedside.  More responsive, denies abd pain.  Objective: Vital signs in last 24 hours: Temp:  [97.8 F (36.6 C)-98.4 F (36.9 C)] 98.3 F (36.8 C) (05/30 0354) Pulse Rate:  [39-139] 87 (05/30 0700) Resp:  [15-27] 21 (05/30 0700) BP: (92-135)/(33-75) 109/39 (05/30 0700) SpO2:  [90 %-100 %] 95 % (05/30 0700) Weight:  [106.7 kg] 106.7 kg (05/30 0349) Last BM Date : 06/21/23  Intake/Output from previous day: 05/29 0701 - 05/30 0700 In: 1004.5 [I.V.:889.6; IV Piggyback:114.9] Out: 400 [Emesis/NG output:400] Intake/Output this shift: No intake/output data recorded.  Physical Exam: HEENT - sclerae clear, mucous membranes moist Abdomen - softer, non-tender  Lab Results:  Recent Labs    06/21/23 0441 06/22/23 0643  WBC 5.6 9.5  HGB 8.9* 9.2*  HCT 28.1* 27.4*  PLT 208 204   BMET Recent Labs    06/21/23 0441 06/22/23 0643  NA 137 144  K 2.8* 3.1*  CL 105 112*  CO2 25 25  GLUCOSE 209* 194*  BUN 51* 38*  CREATININE 1.19 1.08  CALCIUM  8.1* 8.6*   PT/INR No results for input(s): "LABPROT", "INR" in the last 72 hours. Comprehensive Metabolic Panel:    Component Value Date/Time   NA 144 06/22/2023 0643   NA 137 06/21/2023 0441   NA 140 10/31/2022 0913   NA 142 07/19/2022 0956   K 3.1 (L) 06/22/2023 0643   K 2.8 (L) 06/21/2023 0441   CL 112 (H) 06/22/2023 0643   CL 105 06/21/2023 0441   CO2 25 06/22/2023 0643   CO2 25 06/21/2023 0441   BUN 38 (H) 06/22/2023 0643   BUN 51 (H) 06/21/2023 0441   BUN 25 10/31/2022 0913   BUN 22 07/19/2022 0956   CREATININE 1.08 06/22/2023 0643   CREATININE 1.19 06/21/2023 0441    CREATININE 1.11 10/14/2015 0929   CREATININE 1.00 09/29/2014 0900   GLUCOSE 194 (H) 06/22/2023 0643   GLUCOSE 209 (H) 06/21/2023 0441   CALCIUM  8.6 (L) 06/22/2023 0643   CALCIUM  8.1 (L) 06/21/2023 0441   AST 14 (L) 06/21/2023 0441   AST 15 06/20/2023 0454   ALT 12 06/21/2023 0441   ALT 13 06/20/2023 0454   ALKPHOS 70 06/21/2023 0441   ALKPHOS 75 06/20/2023 0454   BILITOT 1.5 (H) 06/21/2023 0441   BILITOT 1.5 (H) 06/20/2023 0454   BILITOT 1.2 10/31/2022 0913   PROT 5.7 (L) 06/21/2023 0441   PROT 5.6 (L) 06/20/2023 0454   PROT 5.9 (L) 10/31/2022 0913   ALBUMIN  2.8 (L) 06/21/2023 0441   ALBUMIN  3.0 (L) 06/20/2023 0454   ALBUMIN  4.0 10/31/2022 0913    Studies/Results: DG Abd Portable 1V Result Date: 06/21/2023 CLINICAL DATA:  Small bowel obstruction. EXAM: PORTABLE ABDOMEN - 1 VIEW COMPARISON:  06/20/2023, CT 06/19/2023 FINDINGS: NG tube in the proximal stomach. Access tubing for gastric banding device noted. Posterior lumbar fusion. Dilated loops of small bowel appears slightly improved compared to prior. Loops measure up to 4.9 cm compared to 5.4 cm. Oral contrast not readily identified in the stomach, colon or small bowel.  Gas distended ascending colon again noted. Gas in the descending colon. No clear gas in the rectum. IMPRESSION: 1. Slight improvement in small bowel distension. 2. NG tube in stomach. 3. The administered oral contrast through the NG tube on 06/20/2018 is not readily identifiable. Potential dilution effect. Electronically Signed   By: Deboraha Fallow M.D.   On: 06/21/2023 08:39   DG Abd Portable 1V-Small Bowel Obstruction Protocol-initial, 8 hr delay Result Date: 06/20/2023 CLINICAL DATA:  Small bowel obstruction, 8 hour delay. EXAM: PORTABLE ABDOMEN - 1 VIEW COMPARISON:  CT yesterday FINDINGS: No enteric contrast in the colon. Persistent air and contrast distension of small bowel. There is likely air-filled colon in the right abdomen. Tip of the enteric tube in the  stomach. Gastric band is faintly visualized. Cholecystectomy clips in the right upper quadrant. IMPRESSION: No enteric contrast in the colon. Enteric contrast persists within dilated small bowel. Persisting gaseous bowel distension. Recommend 24 hour delayed film. Electronically Signed   By: Chadwick Colonel M.D.   On: 06/20/2023 20:57      Carlos Dawson 06/22/2023  Patient ID: [name], male   DOB: 06-08-1940, 83 y.o.   MRN: [id number]

## 2023-06-22 NOTE — Progress Notes (Signed)
 PHARMACY - TOTAL PARENTERAL NUTRITION CONSULT NOTE   Indication: Prolonged ileus  Patient Measurements: Height: 5' 5.5" (166.4 cm) Weight: 106.7 kg (235 lb 3.7 oz) IBW/kg (Calculated) : 62.65 TPN AdjBW (KG): 75.6 Body mass index is 38.55 kg/m. Usual Weight: 235lb on 05/29/23   Assessment:  83 YO male admitted by the orthopedic service on 5/19 after L hip total arthroplasty. Continued admission given concern for hypotension, tachycardia, and dyspnea with exertion (cardiology following). Per 5/24 KUB, likely ileus. Per MD, patient has not had adequate nutrition x3 days post-op. Pharmacy consulted for TPN management.  Glucose / Insulin : Hx of T2DM on PTA Jardiance  - CBGs improved, 159-194 in last 24hrs - 20 units of insulin  used in the last 24hrs Electrolytes: K low/slightly improved 3.1, CorrCa 9.3, Na upper end of normal range 144 - Continued low K likely due to increased NG output over last few days (improved over last 24h) and increased bowel movements Renal: Scr down 1.08 (BL 1-1.10), BUN improved 38 Hepatic:  - 5/29 labs: albumin  2.8, Tbili 1.5, LFTs and alk phos WNL.   - Ammonia 51 (5/27) Intake / Output; MIVF:  - IVF: none - Intake: PO none, NG none - Output: urine not recorded, NG 400 mL, stool x6 GI Imaging: - 5/24 DG abd: Diffuse gaseous distention of the colon most consistent with ileus. No evidence of small-bowel obstruction. - 5/26 DG abd: Dilated loops of small bowel up to 5.2 cm in diameter. Gaseous prominence of the colon. Appearance could be from ileus or bowel obstruction. - 5/27 CT a/p: dilated small bowel, R colon, and transverse colon >> could reflect distal SBO or ileus.  - 5/29 DG abd: slight improvement in small bowel distension  GI Surgeries / Procedures: N/A  Central access: 5/26 TPN start date: 5/27  Nutritional Goals: Goal TPN rate is 80 mL/hr (provides 99.8 g of protein and 2047 kcals per day)  RD Assessment: Estimated Needs Total Energy Estimated  Needs: 1900-2100 kcals Total Protein Estimated Needs: 95-115 grams Total Fluid Estimated Needs: >/= 1.9L - Pharmacy targeting low end of fluid requirement (1920 mL/day) given patient's history of HF and significant volume overload post-op  Current Nutrition:  Clear liquids and TPN NGT placed 5/26 - clamping NG 5/30 AM and remove later today if pt tolerates CLD, per surgery  Plan:  Now: KCl 10mEq IV x5   At 1800:  Continue TPN at goal rate 4mL/hr Electrolytes in TPN:  Na 30 mEq/L - decreased K 70 mEq/L Will leave the same for now given likely improvement with decreased NG output. IV KCl supplementation ordered. Ca 5 mEq/L Mg 2 mEq/L - increased Phos 20 mmol/L  Cl:Ac 1:1 Add standard MVI and trace elements to TPN Continue moderate q4h SSI and adjust as needed  Monitor TPN labs on Mon/Thurs, and PRN IV thiamine 100mg  daily x5 days ordered (thru 5/31) F/u potential discontinuation of TPN pending toleration of diet advancement    Roselee Cong, PharmD Clinical Pharmacist  5/30/20259:37 AM

## 2023-06-22 NOTE — Progress Notes (Signed)
 Physical Therapy Treatment Patient Details Name: Carlos Dawson MRN: [id number] DOB: 1940-07-21 Today's Date: 06/22/2023   History of Present Illness patient is a 83 y.o. male who was admitted to the hospital by the orthopedic service on 5/19 after successful left hip total arthroplasty who is now being seen in consultation due to concern for hypotension, tachycardia and dyspnea with exertion. Transfer to SDU 5/22 due to A Fib. PMH:CAD, diastolic CHF, COPD on room air, history of DVT, paroxysmal atrial fibrillation status post Watchman procedure, no longer on anticoagulation    PT Comments  Pt seen in ICU RM # [id number] POD # 11 L THR post OP A Fib then an Ileus Pt was OOB in recliner.  NG tube clamped can now have clears.  Pt needing to "use the bathroom" so assisted out of recliner to amb to the bathroom.  Pt self able to rise from recliner with min VC's on proper hand placement.  Safety with multiple lines.  Amb 11 feet to bathroom using RW.  Pt sat on toilet 14 min having loose watery stools.  Assisted with peri care.  Pt too fatigued to amb in hallway and requested back to bed.  Amb another 11 feet from bathroom to bed.  Required + 2 assist back to bed then position to comfort.   Prior to admit Pt was Indep/Driving. Pt plans to return home with Spouse when medically stable.      If plan is discharge home, recommend the following: Assistance with cooking/housework;Help with stairs or ramp for entrance;Assist for transportation;A lot of help with walking and/or transfers;A lot of help with bathing/dressing/bathroom   Can travel by private vehicle        Equipment Recommendations  None recommended by PT    Recommendations for Other Services       Precautions / Restrictions Precautions Precautions: Fall Precaution/Restrictions Comments: NPO/NG tube/TPN/PICC Restrictions Weight Bearing Restrictions Per Provider Order: No LLE Weight Bearing Per Provider Order: Weight bearing as tolerated      Mobility  Bed Mobility Overal bed mobility: Needs Assistance Bed Mobility: Sit to Supine       Sit to supine: Max assist, +2 for physical assistance, +2 for safety/equipment   General bed mobility comments: Required Max Asisst back to bed due to fatigue/weakness then positioned to comfort.    Transfers Overall transfer level: Needs assistance Equipment used: Rolling walker (2 wheels) Transfers: Sit to/from Stand Sit to Stand: Contact guard assist, Min assist           General transfer comment: VC's on proper hand placement to "push up" vs "pull up" on walker. Also assisted on/off real toilet in bathroom.  Pt was excited about that.    Ambulation/Gait Ambulation/Gait assistance: Supervision, Contact guard assist Gait Distance (Feet): 22 Feet (11 feet x 2) Assistive device: Rolling walker (2 wheels) Gait Pattern/deviations: Step-to pattern, Decreased step length - left, Antalgic Gait velocity: decreased     General Gait Details: assisted with amb to the "real" bathroom which Pt was excited about. Used rolling walker at contact [employer] with VC's on proper walker to self distance and safety with turns with caution IV Pole and multiple lines.  Pt sat on toilet x 14 min loose watery stools.  Too fatigued to amb in hallway, so assisted from bathroom to bed a totaol of 22 feet (11 feet x 2).  Progressing well with hip with no "reall pain" just "sore".   Stairs  Wheelchair Mobility     Tilt Bed    Modified Rankin (Stroke Patients Only)       Balance                                            Communication Communication Communication: Impaired Factors Affecting Communication: Hearing impaired  Cognition Arousal: Alert Behavior During Therapy: WFL for tasks assessed/performed   PT - Cognitive impairments: No apparent impairments                       PT - Cognition Comments: AxO x 3 pleasant and motivated.   Retired Arts development officer.  Lives with Spouse and was IND/Driving. Following commands: Intact      Cueing Cueing Techniques: Verbal cues  Exercises      General Comments        Pertinent Vitals/Pain Pain Assessment Pain Assessment: Faces Faces Pain Scale: Hurts a little bit Pain Location: throat (irritation NG tube) Pain Descriptors / Indicators: Discomfort Pain Intervention(s): Monitored during session    Home Living                          Prior Function            PT Goals (current goals can now be found in the care plan section) Progress towards PT goals: Progressing toward goals    Frequency    7X/week      PT Plan      Co-evaluation              AM-PAC PT "6 Clicks" Mobility   Outcome Measure  Help needed turning from your back to your side while in a flat bed without using bedrails?: A Lot Help needed moving from lying on your back to sitting on the side of a flat bed without using bedrails?: A Lot Help needed moving to and from a bed to a chair (including a wheelchair)?: A Lot Help needed standing up from a chair using your arms (e.g., wheelchair or bedside chair)?: A Lot Help needed to walk in hospital room?: A Lot Help needed climbing 3-5 steps with a railing? : Total 6 Click Score: 11    End of Session Equipment Utilized During Treatment: Gait belt Activity Tolerance: Patient limited by fatigue Patient left: in bed;with call bell/phone within reach;with family/visitor present Nurse Communication: Mobility status PT Visit Diagnosis: Difficulty in walking, not elsewhere classified (R26.2);Muscle weakness (generalized) (M62.81) Pain - Right/Left: Left Pain - part of body: Hip;Leg     Time: 1129-1205 PT Time Calculation (min) (ACUTE ONLY): 36 min  Charges:    $Gait Training: 8-22 mins $Therapeutic Activity: 8-22 mins PT General Charges $$ ACUTE PT VISIT: 1 Visit                     Bess Broody  PTA Acute  Rehabilitation  Services Office M-F          [phone]

## 2023-06-22 NOTE — Progress Notes (Signed)
 PROGRESS NOTE   Carlos Dawson  XBJ:[id number]    DOB: Mar 02, 1940    DOA: 06/11/2023  PCP: Tena Feeling, MD   I have briefly reviewed patients previous medical records in [facility].   Brief Hospital Course:  83 year old male with PMH of PAF no longer on anticoagulation, s/p Watchman device 06/15/2022, COPD/asthma not on home O2 prior to admission, OSA on nightly CPAP, anxiety, BPH, prostate cancer, anemia, GERD, DM, chronic diastolic CHF, obesity s/p lap band, HLD, DVT/PE, CAD s/p elective right total hip arthroplasty on 06/11/2023.  Postop course complicated by tachyarrhythmia, hypotension, anemia, ileus versus SBO.  Orthopedics are attending.  TRH/cardiology/GI and general surgery are consulting.  Currently n.p.o./NG tube/PICC line with TPN.  Postop ileus gradually improving.   Assessment & Plan:   Postop ileus Managed conservatively with bowel rest/n.p.o., NG tube, PICC line with TPN. CT A/P 5/27: Detailed report appreciated.  Concern of distal SBO or ileus. Mobilize.  Minimize opioids.  Trend correct electrolyte abnormalities as needed. [facility] input 5/27 appreciated.  They did not think that a rectal tube would be helpful.  GI signed off 5/28 General surgery consultation appreciated.  S/p small bowel protocol 5/28, follow-up abdominal x-ray without improvement and no contrast in the colon.  KUB 5/29 showed slight improvement in small bowel distention. Clinically continues to improve.  Having soft BMs, abdominal distention has resolved.  NGT output has reduced but still approximately 700 mL over last 24 hours.  Mobilizing.  Limited use of opioids. Await CCS follow-up, they may plan NG tube and initiate diet trials.  PAF with tachycardia Cardiology following. Post hip surgery, he was noted to be hypotensive, tachycardic and thought to be in A-fib with RVR. S/p ablation 04/2017 and Watchman device 05/2022. Currently on IV metoprolol  2.5 Mg every 6 hourly with holding parameters.   Dose was reduced due to hypotension on 5/29. Was on IV amiodarone earlier but not on it now.  May need it again if rates increase. Postop left hip DVT prophylaxis Xarelto  10 Mg daily was held 5/28 and transitioned to subcutaneous Lovenox for DVT prophylaxis due to n.p.o. status.  Once able to tolerate diet, can switch back to Xarelto . As per cardiology, his telemetry mostly shows normal sinus rhythm with frequent PACs with a baseline of RBBB and some runs of A-fib.  They did not recommend full anticoagulation. Mostly sinus rhythm with frequent PACs, controlled ventricular rate.  Cannot tell if has intermittent A-fib.  Acute on chronic diastolic CHF/Anasarca TTE 5/23: LVEF 70-75% Remains with significant volume overload/anasarca-this is likely multifactorial due to hypoalbuminemia, IV fluid resuscitation and CHF.  Volume slowly improving. Due to hypotension on 5/29, rising BUN, held Lasix , gave IV albumin  12.5 g every 6 hours x 2 and monitor closely. Continue to hold Lasix  for now.  Hypotension SBP's in the mid 80s on 5/29 morning.  Held IV Lasix .  Decreased IV scheduled metoprolol  for his PAF with holding parameters and giving IV albumin  Blood pressures improved but still soft with SBP's in the 100s-110 range.  CAD s/p PCI As per cardiology, stress PET on 10/24 was a high risk study.  They recommend outpatient follow-up to consider ischemic evaluation Currently on rectal aspirin  300 Mg daily while NPO.  Continue beta-blockers.  Can transition metoprolol  and aspirin  to p.o. when tolerating.  Acute respiratory failure with hypoxia Suspect multifactorial As per chart review, previously had oxygen  saturations of 87% on room air Has been off of oxygen  for several hours send  per family, saturating in the low 90s on room air. Resolved.  Macrocytic anemia History of iron  deficiency anemia Hemoglobin dropped from 11.2 on 5/6-7.9 on 5/23.  S/p 1 unit of PRBC on 5/23. Hemoglobin stable in the low  to 9 g range.  Attempt to keep hemoglobin >8 g per DL.  Hypokalemia Continue to replace aggressively in IV and in TPN per pharmacy.  Magnesium  2.8. Attempt to keep K >4 and mag >2.  S/p left total hip arthroplasty on 5/19 Management per primary service. Not much pain.  Mobilizing with PT. Intermittent mild and transient pain when moving but otherwise pain controlled off of any meds.  History of thoracic aortic aneurysm Outpatient follow-up  History of PE Per home med rec review, did not appear to be on anticoagulation PTA. Continue Lovenox for postop DVT prophylaxis while NPO.  This can be switched back to Xarelto  when tolerating p.o. well.  BPH/history of prostate cancer Resume home meds when able.  Obesity s/p lap banding Noted  OSA on nightly CPAP Continue as tolerated. Unable to use CPAP due to NG tube  Delirium Likely multifactorial.  Delirium precautions.  Minimize opioids/sedatives.  Family at bedside would be helpful. Do not think that the minimally elevated ammonia 51 on 5/27 is of significance. TSH normal. Resolved.  Type II DM A1c 5.7 on 5/6 Remains on NovoLog  SSI.  CBGs reasonably controlled.  Elevated creatinine Creatinine of 0.98 on 5/6, meeting for stage II CKD Since hospital admission, creatinine has peaked to 1.26. Does not meet criteria for AKI. Monitor BMP closely. Minimize nephrotoxic agents. Creatinine has normalized.  Holding Lasix , IV albumin  as above. BUN improved.  Body mass index is 38.55 kg/m./Obesity Complicates care.   DVT prophylaxis: Place TED hose Start: 06/11/23 1651 SCDs Start: 06/11/23 1433     Code Status: Full Code:  Family Communication: Discussed extensively with patient's 2 sons and spouse at bedside.   Disposition:  Inpatient appropriate.  Still n.p.o., NG tube, awaiting advancing diet.  Once NG tube is out and tolerating some diet, can transfer to telemetry, possibly tomorrow.     Consultants:    [facility] Cardiology [facility] General Surgery  Procedures:   As noted above. PICC line NGT  Subjective:  No abdominal distention or pain.  Having BMs.  No nausea or vomiting.  Transient and intermittent low back pain only on movements.  Has been off of oxygen  for several hours.  No dyspnea or chest pain.  Mild throat discomfort from NG tube.  Objective:   Vitals:   06/22/23 0400 06/22/23 0500 06/22/23 0600 06/22/23 0700  BP: 92/75  (!) 111/55 (!) 109/39  Pulse: (!) 139  (!) 122 87  Resp: (!) 24 (!) 21 (!) 21 (!) 21  Temp:      TempSrc:      SpO2: 92%  100% 95%  Weight:      Height:        General exam: Elderly male, moderately built and obese lying comfortably propped up in bed without distress.  Oral mucosa moist. Respiratory system: Slightly reduced breath sounds in the bases but otherwise clear to auscultation without wheezing, rhonchi or crackles.  No increased work of breathing.  On room air. Cardiovascular system: S1 and S2 heard, RRR.  No JVD.  No pedal edema.  Trace bilateral lower thigh edema.  Telemetry personally reviewed: Mostly sinus rhythm with frequent PVCs, unable to tell if he has intermittent rate controlled A-fib.  Underlying BBB morphology. Gastrointestinal system: Abdomen  is nondistended, soft, not tense or tender.  Has palpable Lap-Band in the lower epigastric region.  No other surgical scars noted.  Few bowel sounds heard.  No organomegaly or masses appreciated. Central nervous system: Alert and oriented x 3.  Answers all questions appropriately, speech much more clear. No focal neurological deficits. Extremities: Symmetric 5 x 5 power. Skin: No rashes, lesions or ulcers Psychiatry: Judgement and insight appear normal. Mood & affect appropriate.     Data Reviewed:   I have personally reviewed following labs and imaging studies   CBC: Recent Labs  Lab 06/20/23 0454 06/21/23 0441 06/22/23 0643  WBC 4.4 5.6 9.5  HGB 9.4* 8.9* 9.2*  HCT 28.4* 28.1*  27.4*  MCV 106.4* 109.3* 112.3*  PLT 255 208 204    Basic Metabolic Panel: Recent Labs  Lab 06/18/23 0310 06/19/23 0405 06/20/23 0454 06/21/23 0441 06/22/23 0643  NA 132* 133* 137 137 144  K 3.2* 3.1* 3.1* 2.8* 3.1*  CL 102 101 103 105 112*  CO2 19* 24 25 25 25   GLUCOSE 193* 131* 182* 209* 194*  BUN 36* 44* 52* 51* 38*  CREATININE 1.26* 1.22 1.26* 1.19 1.08  CALCIUM  8.4* 8.0* 8.5* 8.1* 8.6*  MG 2.2 2.1 2.4 2.4 2.1  PHOS  --  3.2 2.7 2.8 2.8    Liver Function Tests: Recent Labs  Lab 06/19/23 0405 06/20/23 0454 06/21/23 0441  AST 17 15 14*  ALT 14 13 12   ALKPHOS 68 75 70  BILITOT 1.6* 1.5* 1.5*  PROT 5.4* 5.6* 5.7*  ALBUMIN  2.7* 3.0* 2.8*    CBG: Recent Labs  Lab 06/21/23 1947 06/22/23 0058 06/22/23 0343  GLUCAP 166* 159* 177*    Microbiology Studies:   Recent Results (from the past 240 hours)  MRSA Next Gen by PCR, Nasal     Status: None   Collection Time: 06/14/23  6:32 PM   Specimen: Nasal Mucosa; Nasal Swab  Result Value Ref Range Status   MRSA by PCR Next Gen NOT DETECTED NOT DETECTED Final    Comment: (NOTE) The GeneXpert MRSA Assay (FDA approved for NASAL specimens only), is one component of a comprehensive MRSA colonization surveillance program. It is not intended to diagnose MRSA infection nor to guide or monitor treatment for MRSA infections. Test performance is not FDA approved in patients less than 66 years old. Performed at [facility], [address], [place], [place] [address]     Radiology Studies:  DG Abd Portable 1V Result Date: 06/21/2023 CLINICAL DATA:  Small bowel obstruction. EXAM: PORTABLE ABDOMEN - 1 VIEW COMPARISON:  06/20/2023, CT 06/19/2023 FINDINGS: NG tube in the proximal stomach. Access tubing for gastric banding device noted. Posterior lumbar fusion. Dilated loops of small bowel appears slightly improved compared to prior. Loops measure up to 4.9 cm compared to 5.4 cm. Oral contrast not readily  identified in the stomach, colon or small bowel. Gas distended ascending colon again noted. Gas in the descending colon. No clear gas in the rectum. IMPRESSION: 1. Slight improvement in small bowel distension. 2. NG tube in stomach. 3. The administered oral contrast through the NG tube on 06/20/2018 is not readily identifiable. Potential dilution effect. Electronically Signed   By: Deboraha Fallow M.D.   On: 06/21/2023 08:39   DG Abd Portable 1V-Small Bowel Obstruction Protocol-initial, 8 hr delay Result Date: 06/20/2023 CLINICAL DATA:  Small bowel obstruction, 8 hour delay. EXAM: PORTABLE ABDOMEN - 1 VIEW COMPARISON:  CT yesterday FINDINGS: No enteric contrast in  the colon. Persistent air and contrast distension of small bowel. There is likely air-filled colon in the right abdomen. Tip of the enteric tube in the stomach. Gastric band is faintly visualized. Cholecystectomy clips in the right upper quadrant. IMPRESSION: No enteric contrast in the colon. Enteric contrast persists within dilated small bowel. Persisting gaseous bowel distension. Recommend 24 hour delayed film. Electronically Signed   By: Chadwick Colonel M.D.   On: 06/20/2023 20:57    Scheduled Meds:    aspirin   300 mg Rectal Daily   Chlorhexidine  Gluconate Cloth  6 each Topical Daily   enoxaparin (LOVENOX) injection  55 mg Subcutaneous Q24H   insulin  aspart  0-15 Units Subcutaneous Q4H   metoprolol  tartrate  2.5 mg Intravenous Q6H   pantoprazole  (PROTONIX ) IV  40 mg Intravenous Q24H   sennosides  10 mL Per Tube BID   sodium chloride  flush  10-40 mL Intracatheter Q12H   tamsulosin   0.4 mg Oral QPM   thiamine (VITAMIN B1) injection  100 mg Intravenous Daily    Continuous Infusions:    acetaminophen  Stopped (06/21/23 1349)   TPN ADULT (ION) 80 mL/hr at 06/22/23 0600     LOS: 8 days     Aubrey Blas, MD,  FACP, [facility], [facility], [facility]   [facility] [place]      To contact the  attending provider between 7A-7P or the covering provider during after hours 7P-7A, please log into the web site [email or web address] and access using universal  password for that web site. If you do not have the password, please call the hospital operator.  06/22/2023, 7:44 AM

## 2023-06-23 ENCOUNTER — Inpatient Hospital Stay (HOSPITAL_COMMUNITY)

## 2023-06-23 DIAGNOSIS — K567 Ileus, unspecified: Secondary | ICD-10-CM | POA: Diagnosis not present

## 2023-06-23 DIAGNOSIS — I480 Paroxysmal atrial fibrillation: Secondary | ICD-10-CM | POA: Diagnosis not present

## 2023-06-23 DIAGNOSIS — K9189 Other postprocedural complications and disorders of digestive system: Secondary | ICD-10-CM | POA: Diagnosis not present

## 2023-06-23 DIAGNOSIS — R197 Diarrhea, unspecified: Secondary | ICD-10-CM | POA: Diagnosis not present

## 2023-06-23 DIAGNOSIS — I5033 Acute on chronic diastolic (congestive) heart failure: Secondary | ICD-10-CM | POA: Diagnosis not present

## 2023-06-23 DIAGNOSIS — E876 Hypokalemia: Secondary | ICD-10-CM | POA: Diagnosis not present

## 2023-06-23 LAB — CBC
HCT: 27.5 % — ABNORMAL LOW (ref 39.0–52.0)
Hemoglobin: 8.6 g/dL — ABNORMAL LOW (ref 13.0–17.0)
MCH: 34.5 pg — ABNORMAL HIGH (ref 26.0–34.0)
MCHC: 31.3 g/dL (ref 30.0–36.0)
MCV: 110.4 fL — ABNORMAL HIGH (ref 80.0–100.0)
Platelets: 182 10*3/uL (ref 150–400)
RBC: 2.49 MIL/uL — ABNORMAL LOW (ref 4.22–5.81)
RDW: 17.3 % — ABNORMAL HIGH (ref 11.5–15.5)
WBC: 10.2 10*3/uL (ref 4.0–10.5)
nRBC: 1 % — ABNORMAL HIGH (ref 0.0–0.2)

## 2023-06-23 LAB — COMPREHENSIVE METABOLIC PANEL WITH GFR
ALT: 11 U/L (ref 0–44)
AST: 15 U/L (ref 15–41)
Albumin: 2.9 g/dL — ABNORMAL LOW (ref 3.5–5.0)
Alkaline Phosphatase: 69 U/L (ref 38–126)
Anion gap: 7 (ref 5–15)
BUN: 33 mg/dL — ABNORMAL HIGH (ref 8–23)
CO2: 22 mmol/L (ref 22–32)
Calcium: 8.3 mg/dL — ABNORMAL LOW (ref 8.9–10.3)
Chloride: 112 mmol/L — ABNORMAL HIGH (ref 98–111)
Creatinine, Ser: 0.85 mg/dL (ref 0.61–1.24)
GFR, Estimated: >60 mL/min (ref >60)
Glucose, Bld: 218 mg/dL — ABNORMAL HIGH (ref 70–99)
Potassium: 3.2 mmol/L — ABNORMAL LOW (ref 3.5–5.1)
Sodium: 141 mmol/L (ref 135–145)
Total Bilirubin: 1.3 mg/dL — ABNORMAL HIGH (ref 0.0–1.2)
Total Protein: 5.6 g/dL — ABNORMAL LOW (ref 6.5–8.1)

## 2023-06-23 LAB — GASTROINTESTINAL PANEL BY PCR, STOOL (REPLACES STOOL CULTURE)

## 2023-06-23 LAB — GLUCOSE, CAPILLARY
Glucose-Capillary: 120 mg/dL — ABNORMAL HIGH (ref 70–99)
Glucose-Capillary: 132 mg/dL — ABNORMAL HIGH (ref 70–99)
Glucose-Capillary: 135 mg/dL — ABNORMAL HIGH (ref 70–99)
Glucose-Capillary: 151 mg/dL — ABNORMAL HIGH (ref 70–99)
Glucose-Capillary: 160 mg/dL — ABNORMAL HIGH (ref 70–99)
Glucose-Capillary: 182 mg/dL — ABNORMAL HIGH (ref 70–99)
Glucose-Capillary: 187 mg/dL — ABNORMAL HIGH (ref 70–99)

## 2023-06-23 LAB — MAGNESIUM: Magnesium: 2 mg/dL (ref 1.7–2.4)

## 2023-06-23 MED ORDER — PANTOPRAZOLE SODIUM 40 MG PO TBEC
40.0000 mg | DELAYED_RELEASE_TABLET | Freq: Every day | ORAL | Status: DC
  Administered 2023-06-23 – 2023-06-24 (×2): 40 mg via ORAL
  Filled 2023-06-23 (×2): qty 1

## 2023-06-23 MED ORDER — ASPIRIN 81 MG PO TBEC
81.0000 mg | DELAYED_RELEASE_TABLET | Freq: Every day | ORAL | Status: DC
  Administered 2023-06-23 – 2023-06-24 (×2): 81 mg via ORAL
  Filled 2023-06-23 (×2): qty 1

## 2023-06-23 MED ORDER — METOPROLOL TARTRATE 12.5 MG HALF TABLET
12.5000 mg | ORAL_TABLET | Freq: Two times a day (BID) | ORAL | Status: DC
  Administered 2023-06-23 – 2023-06-24 (×2): 12.5 mg via ORAL
  Filled 2023-06-23 (×4): qty 1

## 2023-06-23 MED ORDER — RIVAROXABAN 10 MG PO TABS
10.0000 mg | ORAL_TABLET | Freq: Every day | ORAL | Status: DC
  Administered 2023-06-23 – 2023-06-24 (×2): 10 mg via ORAL
  Filled 2023-06-23 (×4): qty 1

## 2023-06-23 MED ORDER — POTASSIUM CHLORIDE CRYS ER 20 MEQ PO TBCR
40.0000 meq | EXTENDED_RELEASE_TABLET | ORAL | Status: AC
  Administered 2023-06-23 (×2): 40 meq via ORAL
  Filled 2023-06-23 (×2): qty 2

## 2023-06-23 MED ORDER — TOPIRAMATE 25 MG PO TABS
50.0000 mg | ORAL_TABLET | Freq: Two times a day (BID) | ORAL | Status: DC
  Administered 2023-06-23 – 2023-06-24 (×4): 50 mg via ORAL
  Filled 2023-06-23 (×4): qty 2

## 2023-06-23 MED ORDER — TRAMADOL HCL 50 MG PO TABS: 50.0000 mg | ORAL_TABLET | Freq: Four times a day (QID) | ORAL | Status: DC

## 2023-06-23 MED ORDER — LORATADINE 10 MG PO TABS
10.0000 mg | ORAL_TABLET | Freq: Every day | ORAL | Status: DC
  Administered 2023-06-23 – 2023-06-24 (×2): 10 mg via ORAL
  Filled 2023-06-23 (×2): qty 1

## 2023-06-23 MED ORDER — TIZANIDINE HCL 4 MG PO TABS: 4.0000 mg | ORAL_TABLET | Freq: Three times a day (TID) | ORAL | Status: DC | PRN

## 2023-06-23 MED ORDER — TRAVASOL 10 % IV SOLN
INTRAVENOUS | Status: DC
  Filled 2023-06-23: qty 499.2

## 2023-06-23 MED ORDER — VITAMIN D 25 MCG (1000 UNIT) PO TABS
5000.0000 [IU] | ORAL_TABLET | Freq: Every day | ORAL | Status: DC
  Administered 2023-06-23 – 2023-06-24 (×2): 5000 [IU] via ORAL
  Filled 2023-06-23 (×2): qty 5

## 2023-06-23 MED ORDER — CHOLECALCIFEROL 125 MCG (5000 UT) PO CAPS: 5000.0000 [IU] | ORAL_CAPSULE | Freq: Every day | ORAL | Status: DC

## 2023-06-23 MED ORDER — TRAMADOL HCL 50 MG PO TABS
50.0000 mg | ORAL_TABLET | Freq: Four times a day (QID) | ORAL | Status: DC | PRN
  Administered 2023-06-23 – 2023-06-24 (×2): 50 mg via ORAL
  Filled 2023-06-23 (×2): qty 1

## 2023-06-23 MED ORDER — POTASSIUM CHLORIDE 10 MEQ/100ML IV SOLN: 10.0000 meq | INTRAVENOUS | Status: DC

## 2023-06-23 NOTE — Progress Notes (Signed)
 Pt placed on CPAP for night with 2.5L O2 Bleed. Pt tolerating well. No distress noted. Will continue to monitor pt.      06/23/23 2254  BiPAP/CPAP/SIPAP  BiPAP/CPAP/SIPAP Pt Type Adult  BiPAP/CPAP/SIPAP Resmed  Dentures removed? Not applicable  FiO2 (%) 30 %  Patient Home Machine No  Patient Home Mask Yes  Patient Home Tubing Yes  Auto Titrate Yes  Minimum cmH2O 10 cmH2O  Maximum cmH2O 4 cmH2O  CPAP/SIPAP surface wiped down Yes  Device Plugged into RED Power Outlet Yes  BiPAP/CPAP /SiPAP Vitals  Pulse Rate 87  Resp 18  Bilateral Breath Sounds Diminished  MEWS Score/Color  MEWS Score 0  MEWS Score Color Marrie Sizer

## 2023-06-23 NOTE — Progress Notes (Signed)
    Subjective: 12 Days Post-Op Procedure(s) (LRB): ARTHROPLASTY, HIP, TOTAL, ANTERIOR APPROACH (Left) Patient reports pain as mild.   Denies CP or SOB.  Voiding without difficulty. Positive flatus. Objective: Vital signs in last 24 hours: Temp:  [98 F (36.7 C)-98.9 F (37.2 C)] 98.3 F (36.8 C) (05/31 0854) Pulse Rate:  [35-154] 43 (05/31 0900) Resp:  [14-23] 17 (05/31 0900) BP: (100-140)/(39-90) 117/61 (05/31 0900) SpO2:  [78 %-100 %] 97 % (05/31 0900) FiO2 (%):  [21 %] 21 % (05/30 2019) Weight:  [104.4 kg] 104.4 kg (05/31 0400)  Intake/Output from previous day: 05/30 0701 - 05/31 0700 In: 2401.4 [I.V.:1946; IV Piggyback:455.3] Out: 650 [Urine:650] Intake/Output this shift: No intake/output data recorded.  Labs: Recent Labs    06/21/23 0441 06/22/23 0643 06/23/23 0425  HGB 8.9* 9.2* 8.6*   Recent Labs    06/22/23 0643 06/23/23 0425  WBC 9.5 10.2  RBC 2.44* 2.49*  HCT 27.4* 27.5*  PLT 204 182   Recent Labs    06/22/23 0643 06/23/23 0425  NA 144 141  K 3.1* 3.2*  CL 112* 112*  CO2 25 22  BUN 38* 33*  CREATININE 1.08 0.85  GLUCOSE 194* 218*  CALCIUM  8.6* 8.3*   No results for input(s): "LABPT", "INR" in the last 72 hours.  Physical Exam: Neurologically intact Sensation intact distally Intact pulses distally Dorsiflexion/Plantar flexion intact Incision: dressing C/D/I No cellulitis present Compartment soft Abdomen is still somewhat distended and slightly tender Body mass index is 37.72 kg/m.   Assessment/Plan: 12 Days Post-Op Procedure(s) (LRB): ARTHROPLASTY, HIP, TOTAL, ANTERIOR APPROACH (Left) -Continue care  -continue pain medical management -continue mobilization with PT -continue incentive spirometry  -Patient to be moved to the orthopedic floor today  -Appreciate the hospitalist team and the [facility] team  -WBAT -DVT prophylaxis --xarelto  10 mg daily, TEDS, SCDs   In regards to the hip he is doing very well and is in  minimal pain. Ileus is resloved. Can advance diet and resume oral meds  [place] Due for Dr. Mort Ards [facility] [phone] 06/23/2023, 9:23 AM

## 2023-06-23 NOTE — Progress Notes (Signed)
 Progress Note  Patient Name: Carlos Dawson Date of Encounter: 06/23/2023  Primary Cardiologist:   Peter [name], MD   Subjective   Back pain.  Denies chest pain.  No acute SOB.   Inpatient Medications    Scheduled Meds:  aspirin   300 mg Rectal Daily   Chlorhexidine  Gluconate Cloth  6 each Topical Daily   enoxaparin  (LOVENOX ) injection  55 mg Subcutaneous Q24H   insulin  aspart  0-15 Units Subcutaneous Q4H   metoprolol  tartrate  2.5 mg Intravenous Q6H   pantoprazole  (PROTONIX ) IV  40 mg Intravenous Q24H   senna  1 tablet Oral BID   sodium chloride  flush  10-40 mL Intracatheter Q12H   tamsulosin   0.4 mg Oral QPM   thiamine  (VITAMIN B1) injection  100 mg Intravenous Daily   Continuous Infusions:  potassium chloride      TPN ADULT (ION) 80 mL/hr at 06/23/23 0635   PRN Meds: acetaminophen , albuterol , alum & mag hydroxide-simeth, budesonide -formoterol , HYDROmorphone  (DILAUDID ) injection, lip balm, magnesium  citrate, menthol -cetylpyridinium **OR** phenol, nitroGLYCERIN , ondansetron  **OR** ondansetron  (ZOFRAN ) IV, mouth rinse, polyethylene glycol, simethicone , sodium chloride , sodium chloride  flush, tiotropium, tiZANidine    Vital Signs    Vitals:   06/23/23 0400 06/23/23 0500 06/23/23 0600 06/23/23 0700  BP: 130/70 (!) 140/90 (!) 124/51 (!) 114/56  Pulse: 82 78 96 72  Resp: 16 20 (!) 22 19  Temp: 98.3 F (36.8 C)     TempSrc: Oral     SpO2: 99% 98% 97% 91%  Weight: 104.4 kg     Height:        Intake/Output Summary (Last 24 hours) at 06/23/2023 0728 Last data filed at 06/23/2023 1610 Gross per 24 hour  Intake 2401.36 ml  Output 650 ml  Net 1751.36 ml   Filed Weights   06/21/23 0442 06/22/23 0349 06/23/23 0400  Weight: 109.5 kg 106.7 kg 104.4 kg    Telemetry    NSR, PAF - Personally Reviewed  ECG    NA - Personally Reviewed  Physical Exam   GEN: No acute distress.   Neck: No  JVD Cardiac: RRR, no murmurs, rubs, or gallops.  Respiratory:    Coarse  diffuse crackles.  GI: Soft, nontender, non-distended  MS:  Mild edema; No deformity. Neuro:  Nonfocal  Psych: Normal affect   Labs    Chemistry Recent Labs  Lab 06/20/23 0454 06/21/23 0441 06/22/23 0643 06/23/23 0425  NA 137 137 144 141  K 3.1* 2.8* 3.1* 3.2*  CL 103 105 112* 112*  CO2 25 25 25 22   GLUCOSE 182* 209* 194* 218*  BUN 52* 51* 38* 33*  CREATININE 1.26* 1.19 1.08 0.85  CALCIUM  8.5* 8.1* 8.6* 8.3*  PROT 5.6* 5.7*  --  5.6*  ALBUMIN  3.0* 2.8*  --  2.9*  AST 15 14*  --  15  ALT 13 12  --  11  ALKPHOS 75 70  --  69  BILITOT 1.5* 1.5*  --  1.3*  GFRNONAA 57* >60 >60 >60  ANIONGAP 9 7 7 7      Hematology Recent Labs  Lab 06/21/23 0441 06/22/23 0643 06/23/23 0425  WBC 5.6 9.5 10.2  RBC 2.57* 2.44* 2.49*  HGB 8.9* 9.2* 8.6*  HCT 28.1* 27.4* 27.5*  MCV 109.3* 112.3* 110.4*  MCH 34.6* 37.7* 34.5*  MCHC 31.7 33.6 31.3  RDW 17.3* 17.5* 17.3*  PLT 208 204 182    Cardiac EnzymesNo results for input(s): "TROPONINI" in the last 168 hours. No results for input(s): "TROPIPOC"  in the last 168 hours.   BNP Recent Labs  Lab 06/19/23 1742  BNP 300.9*     DDimer No results for input(s): "DDIMER" in the last 168 hours.   Radiology    DG Abd Portable 1V Result Date: 06/21/2023 CLINICAL DATA:  Small bowel obstruction. EXAM: PORTABLE ABDOMEN - 1 VIEW COMPARISON:  06/20/2023, CT 06/19/2023 FINDINGS: NG tube in the proximal stomach. Access tubing for gastric banding device noted. Posterior lumbar fusion. Dilated loops of small bowel appears slightly improved compared to prior. Loops measure up to 4.9 cm compared to 5.4 cm. Oral contrast not readily identified in the stomach, colon or small bowel. Gas distended ascending colon again noted. Gas in the descending colon. No clear gas in the rectum. IMPRESSION: 1. Slight improvement in small bowel distension. 2. NG tube in stomach. 3. The administered oral contrast through the NG tube on 06/20/2018 is not readily  identifiable. Potential dilution effect. Electronically Signed   By: Deboraha Fallow M.D.   On: 06/21/2023 08:39    Cardiac Studies   Echo:  1. Technically difficult; ascending aorta appears to be moderately  dilated; suggest CTA or MRA to further assess; no pericardial effusion  noted.   2. Left ventricular ejection fraction, by estimation, is 70 to 75%. The  left ventricle has hyperdynamic function. The left ventricle has no  regional wall motion abnormalities. Left ventricular diastolic parameters  are indeterminate.   3. Right ventricular systolic function is normal. The right ventricular  size is normal.   4. The mitral valve is normal in structure. No evidence of mitral valve  regurgitation. No evidence of mitral stenosis.   5. The aortic valve was not well visualized. Aortic valve regurgitation  is not visualized. No aortic stenosis is present.   6. There is mild dilatation of the aortic root, measuring 40 mm. There is  moderate dilatation of the ascending aorta, measuring 46 mm.   Patient Profile     83 y.o. male  history of atrial fibrillation status post ablation in 2019 and Watchman in 05/2022 (due to hematuria), CAD status post PCI in 2012, chronic diastolic heart failure, PE, OSA, T2DM, thoracic aortic aneurysm who we are consulted for evaluation of possible atrial fibrillation     Assessment & Plan    PAF:  Continued PAF.  He has a Watchman so no indication for systemic anticoagulation.  Atrial fib when it occurs is paroxysmal and rate is fairly controlled.   Supplement potassium.   Rates are controlled so I don't think amio is indicated. Continue scheduled q6 hours beta blocker PO now that he is taking pills  Acute on chronic diastolic HF:  Net negative 5.9 liters.   No change in therapy.    CAD:  No in patient ischemia work up planned.    For questions or updates, please contact [facility] Please consult [email or web address] for contact info under Cardiology/STEMI.    Signed, Eilleen Grates, MD  06/23/2023, 7:28 AM

## 2023-06-23 NOTE — Progress Notes (Signed)
 Secure chat with Dr Sherrod Dolphin re PICC not flushing or blood return per Nilda Barthel RN VAS Team.  States to leave PICC in place until morning for potential exchange. Dr wants PIV started for the night.  Aware TNA will not be hung tonight due to PICC malfunction.  Pt currently eating 50% of meal per flowsheet.  Maureen Sour RN also included in conversation.

## 2023-06-23 NOTE — Progress Notes (Signed)
 PROGRESS NOTE   Carlos Dawson  ZOX:[id number]    DOB: Jun 07, 1940    DOA: 06/11/2023  PCP: Tena Feeling, MD   I have briefly reviewed patients previous medical records in [facility].   Brief Hospital Course:  83 year old male with PMH of PAF no longer on anticoagulation, s/p Watchman device 06/15/2022, COPD/asthma not on home O2 prior to admission, OSA on nightly CPAP, anxiety, BPH, prostate cancer, anemia, GERD, DM, chronic diastolic CHF, obesity s/p lap band, HLD, DVT/PE, CAD s/p elective right total hip arthroplasty on 06/11/2023.  Postop course complicated by tachyarrhythmia, hypotension, anemia, ileus versus SBO.  Orthopedics are attending.  TRH/cardiology/GI and general surgery are consulting.  Treated conservatively with n.p.o./NG tube/PICC line with TPN.  Postop ileus resolved, NGT out, advancing diet.  Transferring to telemetry 5/31   Assessment & Plan:   Postop ileus Managed conservatively with bowel rest/n.p.o., NG tube, PICC line with TPN. CT A/P 5/27: Detailed report appreciated.  Concern of distal SBO or ileus. Treated conservatively for several days with bowel rest/n.p.o., NG tube, TPN via RUE PICC line, replacing electrolytes, mobilizing, minimizing opioids.  SBO protocol Over the last 48 hours, has made significant improvement.  NGT out 5/30.  Tolerating p.o. liquids, advancing diet Multiple diarrhea, may be due to ileus.  Currently on contact isolation and checking GI panel which is okay. CCS managing.  [facility] saw earlier in admission, signed off.  PAF with tachycardia/frequent PVCs/BBB morphology Cardiology following. Post hip surgery, he was noted to be hypotensive, tachycardic and thought to be in A-fib with RVR. S/p ablation 04/2017 and Watchman device 05/2022. While n.p.o., was on IV metoprolol .  Now that patient is tolerating oral diet, will switch to oral metoprolol  12.5 Mg twice daily. No need for long-term anticoagulation.  Watchman device in place and  functioning normally per cardiology.   Discussed with Dr. Lavonne Prairie  Acute on chronic diastolic CHF/Anasarca TTE 5/23: LVEF 70-75% Remains with some volume overload/anasarca-this is likely multifactorial due to hypoalbuminemia, IV fluid resuscitation and CHF.  Volume slowly improving. Due to hypotension on 5/29, rising BUN, held Lasix , gave IV albumin  12.5 g every 6 hours x 2 and monitor closely. Continue to hold Lasix  for now, especially given lack of respiratory symptoms and ongoing diarrhea with oral diet just resumed.  Hypotension SBP's in the mid 80s on 5/29 morning.   Improved after holding IV Lasix , IV albumin   CAD s/p PCI As per cardiology, stress PET on 10/24 was a high risk study.  They recommend outpatient follow-up to consider ischemic evaluation Will switch rectal aspirin  and IV metoprolol  to oral meds.  Acute respiratory failure with hypoxia Suspect multifactorial Patient reports that he has been off of oxygen  from sometime last night.  Currently saturating up to high 90s on room air.  Did tolerate CPAP last night.  Macrocytic anemia History of iron  deficiency anemia Hemoglobin dropped from 11.2 on 5/6-7.9 on 5/23.  S/p 1 unit of PRBC on 5/23. Hemoglobin stable in the low to 9 g range.  Attempt to keep hemoglobin >8 g per DL. Hemoglobin slightly lower at 8.6 on 5/31, no overt bleeding.  Follow CBC in a.m.  Hypokalemia Persistent hypokalemia, now probably worsened by diarrhea.  Magnesium  2 Continue to replace IV K aggressively and follow BMP in AM. Attempt to keep K >4 and mag >2. Consider discontinuing TPN later today if continues to tolerate diet.  S/p left total hip arthroplasty on 5/19 Management per primary service. Not much pain.  Mobilizing with PT. Intermittent mild and transient pain when moving but otherwise pain controlled off of any meds. Patient can be switched back from subcutaneous Lovenox  DVT prophylaxis to Xarelto  10 Mg daily  History of thoracic  aortic aneurysm Outpatient follow-up  History of PE Per home med rec review, did not appear to be on anticoagulation PTA. Patient can be switched back from subcutaneous Lovenox  DVT prophylaxis to Xarelto  10 Mg daily.  BPH/history of prostate cancer Resume home meds when able.  Obesity s/p lap banding Noted  OSA on nightly CPAP Continue CPAP  Delirium Likely multifactorial.  Delirium precautions.  Minimize opioids/sedatives.  Family at bedside would be helpful. Do not think that the minimally elevated ammonia 51 on 5/27 is of significance. TSH normal. Resolved.  Type II DM A1c 5.7 on 5/6 Remains on NovoLog  SSI.  CBGs reasonably controlled.  Elevated creatinine Creatinine of 0.98 on 5/6, meeting for stage II CKD Since hospital admission, creatinine has peaked to 1.26. Does not meet criteria for AKI. Monitor BMP closely. Minimize nephrotoxic agents. Creatinine has normalized.  Holding Lasix , IV albumin  as above. BUN improved.  Body mass index is 37.72 kg/m./Obesity Complicates care.   DVT prophylaxis: Place TED hose Start: 06/11/23 1651 SCDs Start: 06/11/23 1433     Code Status: Full Code:  Family Communication: None at bedside today. Disposition:  Postop ileus resolved.  Advancing diet and resuming oral meds.  Transferred to telemetry on 5/31.     Consultants:   [facility] Cardiology [facility] General Surgery  Procedures:   As noted above. PICC line NGT  Subjective:  Per nursing report, had BM almost every 45 minutes overnight, type VI stools.  Patient denies abdominal pain.  States that he had 4-5 BMs overnight.  No nausea, vomiting, chest pain, dyspnea or palpitations.  Objective:   Vitals:   06/23/23 0400 06/23/23 0500 06/23/23 0600 06/23/23 0700  BP: 130/70 (!) 140/90 (!) 124/51 (!) 114/56  Pulse: 82 78 96 72  Resp: 16 20 (!) 22 19  Temp: 98.3 F (36.8 C)     TempSrc: Oral     SpO2: 99% 98% 97% 91%  Weight: 104.4 kg     Height:        General  exam: Elderly male, moderately built and obese lying comfortably propped up in bed without distress.  Oral mucosa moist. Respiratory system: Slightly diminished breath sounds in the bases but otherwise clear to auscultation.  On room air.  No increased work of breathing. Cardiovascular system: S1 and S2 heard, RRR.  No JVD.  No pedal edema.  Trace bilateral lower thigh edema.  Telemetry personally reviewed: Underlying BBB morphology.  Sinus rhythm with frequent PVCs, intermittent runs of A-fib.  Ventricular rate mostly controlled with occasional transient mild tachycardia up to 120s-130s, not sure if this was with activity. Gastrointestinal system: Abdomen is nondistended, soft, not tense or tender.  Has palpable Lap-Band in the lower epigastric region.  No other surgical scars noted.  Lots of bowel sounds heard.  No organomegaly or masses appreciated. Central nervous system: Alert and oriented x 3.  Answers all questions appropriately, speech much more clear. No focal neurological deficits. Extremities: Symmetric 5 x 5 power. Skin: No rashes, lesions or ulcers Psychiatry: Judgement and insight appear normal. Mood & affect appropriate.     Data Reviewed:   I have personally reviewed following labs and imaging studies   CBC: Recent Labs  Lab 06/21/23 0441 06/22/23 0643 06/23/23 0425  WBC 5.6 9.5 10.2  HGB 8.9* 9.2* 8.6*  HCT 28.1* 27.4* 27.5*  MCV 109.3* 112.3* 110.4*  PLT 208 204 182    Basic Metabolic Panel: Recent Labs  Lab 06/19/23 0405 06/20/23 0454 06/21/23 0441 06/22/23 0643 06/23/23 0425  NA 133* 137 137 144 141  K 3.1* 3.1* 2.8* 3.1* 3.2*  CL 101 103 105 112* 112*  CO2 24 25 25 25 22   GLUCOSE 131* 182* 209* 194* 218*  BUN 44* 52* 51* 38* 33*  CREATININE 1.22 1.26* 1.19 1.08 0.85  CALCIUM  8.0* 8.5* 8.1* 8.6* 8.3*  MG 2.1 2.4 2.4 2.1 2.0  PHOS 3.2 2.7 2.8 2.8  --     Liver Function Tests: Recent Labs  Lab 06/19/23 0405 06/20/23 0454 06/21/23 0441  06/23/23 0425  AST 17 15 14* 15  ALT 14 13 12 11   ALKPHOS 68 75 70 69  BILITOT 1.6* 1.5* 1.5* 1.3*  PROT 5.4* 5.6* 5.7* 5.6*  ALBUMIN  2.7* 3.0* 2.8* 2.9*    CBG: Recent Labs  Lab 06/22/23 1930 06/23/23 0030 06/23/23 0325  GLUCAP 178* 160* 187*    Microbiology Studies:   Recent Results (from the past 240 hours)  MRSA Next Gen by PCR, Nasal     Status: None   Collection Time: 06/14/23  6:32 PM   Specimen: Nasal Mucosa; Nasal Swab  Result Value Ref Range Status   MRSA by PCR Next Gen NOT DETECTED NOT DETECTED Final    Comment: (NOTE) The GeneXpert MRSA Assay (FDA approved for NASAL specimens only), is one component of a comprehensive MRSA colonization surveillance program. It is not intended to diagnose MRSA infection nor to guide or monitor treatment for MRSA infections. Test performance is not FDA approved in patients less than 41 years old. Performed at [facility], [address] [place], [place] [address]     Radiology Studies:  No results found.   Scheduled Meds:    aspirin   300 mg Rectal Daily   Chlorhexidine  Gluconate Cloth  6 each Topical Daily   enoxaparin  (LOVENOX ) injection  55 mg Subcutaneous Q24H   insulin  aspart  0-15 Units Subcutaneous Q4H   metoprolol  tartrate  2.5 mg Intravenous Q6H   pantoprazole  (PROTONIX ) IV  40 mg Intravenous Q24H   senna  1 tablet Oral BID   sodium chloride  flush  10-40 mL Intracatheter Q12H   tamsulosin   0.4 mg Oral QPM   thiamine  (VITAMIN B1) injection  100 mg Intravenous Daily    Continuous Infusions:    potassium chloride      TPN ADULT (ION) 80 mL/hr at 06/23/23 3086     LOS: 9 days     Aubrey Blas, MD,  FACP, [facility], [facility], [facility]   [facility] [place]      To contact the attending provider between 7A-7P or the covering provider during after hours 7P-7A, please log into the web site [email or web address] and access using universal [place] password  for that web site. If you do not have the password, please call the hospital operator.  06/23/2023, 7:42 AM

## 2023-06-23 NOTE — Plan of Care (Signed)
  Problem: Education: Goal: Knowledge of General Education information will improve Description: Including pain rating scale, medication(s)/side effects and non-pharmacologic comfort measures Outcome: Progressing   Problem: Clinical Measurements: Goal: Ability to maintain clinical measurements within normal limits will improve Outcome: Progressing   Problem: Activity: Goal: Risk for activity intolerance will decrease Outcome: Progressing   Problem: Pain Managment: Goal: General experience of comfort will improve and/or be controlled Outcome: Progressing   Problem: Safety: Goal: Ability to remain free from injury will improve Outcome: Progressing

## 2023-06-23 NOTE — Progress Notes (Signed)
 Notified Dr Sherrod Dolphin & got a message back to page night shift hospitalist. Sent amion message to [employer].

## 2023-06-23 NOTE — Progress Notes (Signed)
 Physical Therapy Treatment Patient Details Name: Carlos Dawson MRN: [id number] DOB: [birth year] Today's Date: 06/23/2023   History of Present Illness patient is a 83 y.o. male who was admitted to the hospital by the orthopedic service on 5/19 after successful left hip total arthroplasty who is now being seen in consultation due to concern for hypotension, tachycardia and dyspnea with exertion. Transfer to SDU 5/22 due to A Fib. PMH:CAD, diastolic CHF, COPD on room air, history of DVT, paroxysmal atrial fibrillation status post Watchman procedure, no longer on anticoagulation    PT Comments  Pt very cooperative and with noted improvements in activity tolerance and balance.  Pt up to ambulate increased distance in hall and up to bathroom for toileting with hand hygiene standing at sink    If plan is discharge home, recommend the following: Assistance with cooking/housework;Help with stairs or ramp for entrance;Assist for transportation;A lot of help with walking and/or transfers;A lot of help with bathing/dressing/bathroom   Can travel by private vehicle        Equipment Recommendations  None recommended by PT    Recommendations for Other Services       Precautions / Restrictions Precautions Precautions: Fall Restrictions Weight Bearing Restrictions Per Provider Order: No LLE Weight Bearing Per Provider Order: Weight bearing as tolerated     Mobility  Bed Mobility Overal bed mobility: Needs Assistance Bed Mobility: Sit to Supine     Supine to sit: Mod assist, +2 for safety/equipment     General bed mobility comments: Required Mod Asisst back to bed due to fatigue/weakness then positioned to comfort.    Transfers Overall transfer level: Needs assistance Equipment used: Rolling walker (2 wheels) Transfers: Sit to/from Stand Sit to Stand: Min assist           General transfer comment: VC's on proper hand placement to "push up" vs "pull up" on walker. Also assisted  on/off real toilet in bathroom.    Ambulation/Gait Ambulation/Gait assistance: Supervision, Contact guard assist Gait Distance (Feet): 75 Feet (and 15' back from bathroom) Assistive device: Rolling walker (2 wheels) Gait Pattern/deviations: Step-to pattern, Decreased step length - left, Antalgic Gait velocity: decreased     General Gait Details: assisted with amb to the "real" bathroom which Pt was excited about. Used rolling walker at contact [employer] with VC's on proper walker to self distance and safety with turns with caution IV Pole and multiple lines.  Pt sat on toilet x 14 min loose watery stools.  Too fatigued to amb in hallway, so assisted from bathroom to bed a totaol of 22 feet (11 feet x 2).  Progressing well with hip with no "reall pain" just "sore".   Stairs             Wheelchair Mobility     Tilt Bed    Modified Rankin (Stroke Patients Only)       Balance Overall balance assessment: History of Falls, Needs assistance Sitting-balance support: Feet supported Sitting balance-Leahy Scale: Good     Standing balance support: During functional activity, Reliant on assistive device for balance, Single extremity supported Standing balance-Leahy Scale: Poor                              Communication Communication Communication: Impaired Factors Affecting Communication: Hearing impaired  Cognition Arousal: Alert Behavior During Therapy: WFL for tasks assessed/performed   PT - Cognitive impairments: No apparent impairments  PT - Cognition Comments: AxO x 3 pleasant and motivated.  Retired Arts development officer.  Lives with Spouse and was IND/Driving. Following commands: Intact      Cueing Cueing Techniques: Verbal cues  Exercises      General Comments        Pertinent Vitals/Pain Pain Assessment Pain Assessment: 0-10 Pain Score: 4  Pain Location: bil hips and low back Pain Descriptors / Indicators:  Discomfort Pain Intervention(s): Limited activity within patient's tolerance, Monitored during session, Premedicated before session, Ice applied    Home Living                          Prior Function            PT Goals (current goals can now be found in the care plan section) Acute Rehab PT Goals Patient Stated Goal: walk farther, be outdoors PT Goal Formulation: With patient/family Time For Goal Achievement: 06/29/2023 Potential to Achieve Goals: Fair Progress towards PT goals: Progressing toward goals    Frequency    7X/week      PT Plan      Co-evaluation PT/OT/SLP Co-Evaluation/Treatment: Yes            AM-PAC PT "6 Clicks" Mobility   Outcome Measure  Help needed turning from your back to your side while in a flat bed without using bedrails?: A Lot Help needed moving from lying on your back to sitting on the side of a flat bed without using bedrails?: A Lot Help needed moving to and from a bed to a chair (including a wheelchair)?: A Lot Help needed standing up from a chair using your arms (e.g., wheelchair or bedside chair)?: A Lot Help needed to walk in hospital room?: A Little Help needed climbing 3-5 steps with a railing? : A Lot 6 Click Score: 13    End of Session Equipment Utilized During Treatment: Gait belt Activity Tolerance: Patient tolerated treatment well;Patient limited by fatigue Patient left: in bed;with call bell/phone within reach;with family/visitor present Nurse Communication: Mobility status PT Visit Diagnosis: Difficulty in walking, not elsewhere classified (R26.2);Muscle weakness (generalized) (M62.81) Pain - Right/Left: Left Pain - part of body: Hip;Leg     Time: 1308-6578 PT Time Calculation (min) (ACUTE ONLY): 34 min  Charges:    $Gait Training: 8-22 mins $Therapeutic Activity: 8-22 mins PT General Charges $$ ACUTE PT VISIT: 1 Visit                     Thedora Finlay PT Acute Rehabilitation Services Pager  [phone] Office [phone]    Sonali Wivell 06/23/2023, 4:02 PM

## 2023-06-23 NOTE — Progress Notes (Signed)
 Kermit Ped, MD called and made aware of all that happened this evening ie., EKG results, VSS , as well as  family concerns about medications. Pt stable resting in bed. MD said she would prioritize and see him in a little.

## 2023-06-23 NOTE — Progress Notes (Signed)
 Carlos Dawson, [place] aware pt's HR was running 130 to 150 when up to BR. Asymptomatic.  HR settled back down @rest . No new orders received at this time.

## 2023-06-23 NOTE — Significant Event (Signed)
 Rapid Response Event Note   Reason for Call : Increased Heart rate     Initial Focused Assessment:  Patient standing at bedside, vital signs stable    Interventions:  EKG Give Metoprolol  that was not given @ 1100  Notify evening Provider   Call Time: 1840 Arrival Time: 1845 End Time:  1905   Serita Danes Jakub Debold, RN

## 2023-06-23 NOTE — Progress Notes (Signed)
 Pt having pain in back. Letty Raya, [place] aware via phone. She ordered a K pad for pt's back so he could offload stage 2 on sacral area laying on his side. Tylenol  1000 helpful for left hip pain which is mild. Received a verbal order from [facility] earlier for Tramadol  50 PRN for increased pain.

## 2023-06-23 NOTE — Progress Notes (Addendum)
 PHARMACY - TOTAL PARENTERAL NUTRITION CONSULT NOTE   Indication: Prolonged ileus  Patient Measurements: Height: 5' 5.5" (166.4 cm) Weight: 104.4 kg (230 lb 2.6 oz) IBW/kg (Calculated) : 62.65 TPN AdjBW (KG): 75.6 Body mass index is 37.72 kg/m. Usual Weight: 235lb on 05/29/23   Assessment:  83 YO male admitted by the orthopedic service on 5/19 after L hip total arthroplasty. Continued admission given concern for hypotension, tachycardia, and dyspnea with exertion (cardiology following). Per 5/24 KUB, likely ileus. Per MD, patient has not had adequate nutrition x3 days post-op. Pharmacy consulted for TPN management.  Glucose / Insulin : Hx of T2DM on PTA Jardiance  - CBGs improved, 159-218 in last 24hrs - 20 units of insulin  used in the last 24hrs Electrolytes: K low/slightly improved 3.2, CorrCa 8.9, all other lytes WNL - Continued low K likely due to excessive bowel movements Renal: Scr improved 0.85 (BL 1-1.10), BUN improved 33 Hepatic: albumin  low 2.9, Tbili low 1.3, LFTs and alk phos WNL.   - Ammonia 51 (5/27) Intake / Output; MIVF:  - IVF: none - Intake: PO none - Output: 650 mL, stool x14, NG removed 5/30 GI Imaging: - 5/24 DG abd: Diffuse gaseous distention of the colon most consistent with ileus. No evidence of small-bowel obstruction. - 5/26 DG abd: Dilated loops of small bowel up to 5.2 cm in diameter. Gaseous prominence of the colon. Appearance could be from ileus or bowel obstruction. - 5/27 CT a/p: dilated small bowel, R colon, and transverse colon >> could reflect distal SBO or ileus.  - 5/29 DG abd: slight improvement in small bowel distension  GI Surgeries / Procedures: N/A  Central access: 5/26 TPN start date: 5/27  Nutritional Goals: Goal TPN rate is 80 mL/hr (provides 99.8 g of protein and 2047 kcals per day)  RD Assessment: Estimated Needs Total Energy Estimated Needs: 1900-2100 kcals Total Protein Estimated Needs: 95-115 grams Total Fluid Estimated Needs:  >/= 1.9L - Pharmacy targeting low end of fluid requirement (1920 mL/day) given patient's history of HF and significant volume overload post-op  Current Nutrition:  Full liquids and TPN NGT removed 5/30 and pt tolerated CLD >> patient advancing to FLD 5/31, surgery okay with 1/2 rate TPN today.  Plan:  Now: KCl 40 mEq PO x2  At 1800:  Decrease TPN rate to 31mL/hr since advancing to FLD, per surgery Electrolytes in TPN:  Na 30 mEq/L K 70 mEq/L Will leave the same for now given likely improvement with no more NG output and anticipated decreased BMs. PO KCl supplementation ordered. Ca 5 mEq/L Mg 3 mEq/L - increased Phos 20 mmol/L  Cl:Ac 1:2 Add standard MVI and trace elements to TPN Continue moderate q4h SSI and adjust as needed  Monitor TPN labs on Mon/Thurs, and PRN IV thiamine  100mg  daily x5 days ordered (thru 5/31) F/u potential discontinuation of TPN pending toleration of diet advancement    Roselee Cong, PharmD Clinical Pharmacist  5/31/20258:51 AM

## 2023-06-23 NOTE — Progress Notes (Signed)
 Felipa Horsfall, RN ICU came to check on pt after I called her about pt's HR running high this afternoon. Amber, PA made aware earlier.  Metoprolol  late am held because parameters were not met at the time so we immediately gave him the medication. EKG was performed showing A fib with rate 103. Back to bed and primo fit applied. Allayed anxiety of family by keeping them informed.

## 2023-06-23 NOTE — Progress Notes (Signed)
 12 Days Post-Op   Subjective/Chief Complaint: Pt with mult BMs No abd pain Denies any nausea.  Objective: Vital signs in last 24 hours: Temp:  [98 F (36.7 C)-98.9 F (37.2 C)] 98.3 F (36.8 C) (05/31 0400) Pulse Rate:  [35-154] 72 (05/31 0700) Resp:  [14-23] 19 (05/31 0700) BP: (100-140)/(39-90) 114/56 (05/31 0700) SpO2:  [78 %-100 %] 91 % (05/31 0700) FiO2 (%):  [21 %] 21 % (05/30 2019) Weight:  [104.4 kg] 104.4 kg (05/31 0400) Last BM Date : 06/23/23  Intake/Output from previous day: 05/30 0701 - 05/31 0700 In: 2401.4 [I.V.:1946; IV Piggyback:455.3] Out: 650 [Urine:650] Intake/Output this shift: No intake/output data recorded.  PE:  Constitutional: No acute distress, conversant, appears states age. Eyes: Anicteric sclerae, moist conjunctiva, no lid lag Lungs: Clear to auscultation bilaterally, normal respiratory effort CV: regular rate and rhythm, no murmurs, no peripheral edema, pedal pulses 2+ GI: Soft, no masses or hepatosplenomegaly, non-tender to palpation Skin: No rashes, palpation reveals normal turgor Psychiatric: appropriate judgment and insight, oriented to person, place, and time   Lab Results:  Recent Labs    06/22/23 0643 06/23/23 0425  WBC 9.5 10.2  HGB 9.2* 8.6*  HCT 27.4* 27.5*  PLT 204 182   BMET Recent Labs    06/22/23 0643 06/23/23 0425  NA 144 141  K 3.1* 3.2*  CL 112* 112*  CO2 25 22  GLUCOSE 194* 218*  BUN 38* 33*  CREATININE 1.08 0.85  CALCIUM  8.6* 8.3*  Anti-infectives: Anti-infectives (From admission, onward)    Start     Dose/Rate Route Frequency Ordered Stop   06/11/23 1830  ceFAZolin  (ANCEF ) IVPB 2g/100 mL premix        2 g 200 mL/hr over 30 Minutes Intravenous Every 6 hours 06/11/23 1651 06/12/23 0747   06/11/23 1030  ceFAZolin  (ANCEF ) IVPB 2g/100 mL premix        2 g 200 mL/hr over 30 Minutes Intravenous On call to O.R. 06/11/23 1025 06/11/23 1300       Assessment/Plan: s/p Procedure(s): ARTHROPLASTY, HIP,  TOTAL, ANTERIOR APPROACH (Left) Partial SBO vs ileus -appears to have resolved with multiple bowel movements.  GI panel pending.             - multiple BM's             - Ok to [employer] to fulls    LOS: 9 days    Shela Derby 06/23/2023

## 2023-06-23 NOTE — Progress Notes (Signed)
 Secure chat and telephone call with Dr Sherrod Dolphin re PICC.  CXR indicates PICC in the proximal SVC, which correlates with the 3cm retraction.  PICC is okay to use.   Dr Sherrod Dolphin states expect TNA to be stopped Sunday.  Recommendations are to proceed with exchange procedure if determined that pt is not consuming enough po intake and TNA to be continued Sunday.  Will follow up on intake Sunday and proceed accordingly.

## 2023-06-24 ENCOUNTER — Inpatient Hospital Stay (HOSPITAL_COMMUNITY)

## 2023-06-24 ENCOUNTER — Other Ambulatory Visit: Payer: Self-pay

## 2023-06-24 DIAGNOSIS — Z4682 Encounter for fitting and adjustment of non-vascular catheter: Secondary | ICD-10-CM | POA: Diagnosis not present

## 2023-06-24 DIAGNOSIS — K560 Paralytic ileus: Secondary | ICD-10-CM | POA: Diagnosis not present

## 2023-06-24 DIAGNOSIS — R0602 Shortness of breath: Secondary | ICD-10-CM | POA: Diagnosis not present

## 2023-06-24 DIAGNOSIS — K567 Ileus, unspecified: Secondary | ICD-10-CM | POA: Diagnosis not present

## 2023-06-24 DIAGNOSIS — I480 Paroxysmal atrial fibrillation: Secondary | ICD-10-CM | POA: Diagnosis not present

## 2023-06-24 DIAGNOSIS — K9189 Other postprocedural complications and disorders of digestive system: Secondary | ICD-10-CM | POA: Diagnosis not present

## 2023-06-24 DIAGNOSIS — I5033 Acute on chronic diastolic (congestive) heart failure: Secondary | ICD-10-CM | POA: Diagnosis not present

## 2023-06-24 DIAGNOSIS — I4891 Unspecified atrial fibrillation: Secondary | ICD-10-CM | POA: Diagnosis not present

## 2023-06-24 LAB — CBC
HCT: 31 % — ABNORMAL LOW (ref 39.0–52.0)
Hemoglobin: 9.4 g/dL — ABNORMAL LOW (ref 13.0–17.0)
MCH: 34.3 pg — ABNORMAL HIGH (ref 26.0–34.0)
MCHC: 30.3 g/dL (ref 30.0–36.0)
MCV: 113.1 fL — ABNORMAL HIGH (ref 80.0–100.0)
Platelets: 179 10*3/uL (ref 150–400)
RBC: 2.74 MIL/uL — ABNORMAL LOW (ref 4.22–5.81)
RDW: 17.5 % — ABNORMAL HIGH (ref 11.5–15.5)
WBC: 11.9 10*3/uL — ABNORMAL HIGH (ref 4.0–10.5)
nRBC: 0.8 % — ABNORMAL HIGH (ref 0.0–0.2)

## 2023-06-24 LAB — GLUCOSE, CAPILLARY
Glucose-Capillary: 122 mg/dL — ABNORMAL HIGH (ref 70–99)
Glucose-Capillary: 138 mg/dL — ABNORMAL HIGH (ref 70–99)
Glucose-Capillary: 143 mg/dL — ABNORMAL HIGH (ref 70–99)
Glucose-Capillary: 149 mg/dL — ABNORMAL HIGH (ref 70–99)
Glucose-Capillary: 152 mg/dL — ABNORMAL HIGH (ref 70–99)
Glucose-Capillary: 77 mg/dL (ref 70–99)

## 2023-06-24 LAB — BASIC METABOLIC PANEL WITH GFR
Anion gap: 7 (ref 5–15)
BUN: 35 mg/dL — ABNORMAL HIGH (ref 8–23)
CO2: 23 mmol/L (ref 22–32)
Calcium: 8.8 mg/dL — ABNORMAL LOW (ref 8.9–10.3)
Chloride: 111 mmol/L (ref 98–111)
Creatinine, Ser: 0.94 mg/dL (ref 0.61–1.24)
GFR, Estimated: >60 mL/min (ref >60)
Glucose, Bld: 142 mg/dL — ABNORMAL HIGH (ref 70–99)
Potassium: 3.6 mmol/L (ref 3.5–5.1)
Sodium: 141 mmol/L (ref 135–145)

## 2023-06-24 LAB — MAGNESIUM: Magnesium: 2 mg/dL (ref 1.7–2.4)

## 2023-06-24 LAB — PHOSPHORUS: Phosphorus: 3.2 mg/dL (ref 2.5–4.6)

## 2023-06-24 MED ORDER — POTASSIUM CHLORIDE 2 MEQ/ML IV SOLN
INTRAVENOUS | Status: AC
  Filled 2023-06-24 (×2): qty 1000

## 2023-06-24 MED ORDER — METOPROLOL TARTRATE 5 MG/5ML IV SOLN
2.5000 mg | Freq: Once | INTRAVENOUS | Status: AC
  Administered 2023-06-24: 2.5 mg via INTRAVENOUS
  Filled 2023-06-24: qty 5

## 2023-06-24 MED ORDER — FUROSEMIDE 40 MG PO TABS
40.0000 mg | ORAL_TABLET | Freq: Every day | ORAL | Status: DC
  Administered 2023-06-24: 40 mg via ORAL
  Filled 2023-06-24: qty 1

## 2023-06-24 MED ORDER — POTASSIUM CHLORIDE CRYS ER 20 MEQ PO TBCR
40.0000 meq | EXTENDED_RELEASE_TABLET | Freq: Every day | ORAL | Status: DC
  Administered 2023-06-24: 40 meq via ORAL
  Filled 2023-06-24: qty 2

## 2023-06-24 MED ORDER — METOPROLOL TARTRATE 5 MG/5ML IV SOLN
2.5000 mg | Freq: Four times a day (QID) | INTRAVENOUS | Status: DC
  Administered 2023-06-24 – 2023-06-25 (×2): 2.5 mg via INTRAVENOUS
  Filled 2023-06-24 (×2): qty 5

## 2023-06-24 MED ORDER — METOPROLOL TARTRATE 25 MG PO TABS
25.0000 mg | ORAL_TABLET | Freq: Two times a day (BID) | ORAL | Status: DC
  Administered 2023-06-24: 25 mg via ORAL
  Filled 2023-06-24: qty 1

## 2023-06-24 MED ORDER — DICYCLOMINE HCL 10 MG PO CAPS
10.0000 mg | ORAL_CAPSULE | Freq: Three times a day (TID) | ORAL | Status: DC | PRN
  Filled 2023-06-24: qty 1

## 2023-06-24 MED ORDER — EMPAGLIFLOZIN 25 MG PO TABS
25.0000 mg | ORAL_TABLET | Freq: Every day | ORAL | Status: DC
  Administered 2023-06-24: 25 mg via ORAL
  Filled 2023-06-24 (×2): qty 1

## 2023-06-24 MED ORDER — INSULIN ASPART 100 UNIT/ML IJ SOLN
0.0000 [IU] | INTRAMUSCULAR | Status: DC
  Administered 2023-06-24: 1 [IU] via SUBCUTANEOUS
  Administered 2023-06-25: 3 [IU] via SUBCUTANEOUS

## 2023-06-24 MED ORDER — INSULIN ASPART 100 UNIT/ML IJ SOLN
0.0000 [IU] | Freq: Three times a day (TID) | INTRAMUSCULAR | Status: DC
  Administered 2023-06-24 (×2): 1 [IU] via SUBCUTANEOUS

## 2023-06-24 NOTE — Progress Notes (Signed)
 Herman Longs, ICU charge nurse, at bedside. Successfully placed NG tube.

## 2023-06-24 NOTE — Progress Notes (Addendum)
 Addendum  Messaged by RN d/t patient c/o worsening abdominal distension, pain Arrived to bedside soon after Patient reports no flatus since this am. Has had small squirt BM's.   Lying flat in bed, appears uncomfortable, mild DOE, just returned from BR Abd: significant abdominal distension, diffuse tender without peritoneal signs, obviously worse than this am. Intermittent tinkling bowel sounds RS: Slightly diminished breath sounds in the bases but otherwise clear to auscultation without wheezing, rhonchi or crackles.  DOE on exertion but not in respiratory distress at rest.   Assessment and plan:  Suspect recurrence of ileus: Stat Abd x-ray, chest x-ray N.p.o. If KUB confirms ileus, will need to replace NG tube, IVF, and redo PICC line with TPN which can probably wait until morning. Updated Dr. Delane Fear, CCS who will evaluate pending x-ray.   Reviewed DG Abd and CXR in person. Ileus noted. Chest clear D/W Dr. Delane Fear, recommends NGT Switched BB to IV Ordered PICC and TPN, can start in am IVF> clinically not in CHF, appears more volume under Discussed in detail with RN and charge RN> need to monitor closely. Will update [facility] night floor cover. If s/s of decompensation, low threshold to transfer back to SDU.  Updated family (spouse and 2 sons) at bedside multiple times.  Aubrey Blas, MD,  FACP, [facility], [facility], [facility]   [facility] [place]     To contact the attending provider between 7A-7P or the covering provider during after hours 7P-7A, please log into the web site [email or web address] and access using universal [place] password for that web site. If you do not have the password, please call the hospital operator.

## 2023-06-24 NOTE — Progress Notes (Signed)
 Brief Note Regarding Parenteral Nutrition  This is an 83 year old male previously on TPN. On 5/31, PICC line malfunctioning so TPN was not hung at 1800 with plan to reassess next day. Today, 6/1 TPN consult discontinued and PICC removed.  Pharmacy consulted 1818 for TPN. Patient complaining of worsening abdominal distension and pain. Suspicion for recurrence of ileus. Plan to repeat imaging. May need to replace NGT and PICC for TPN.  Earliest TPN will be resumed will be 1800 on 6/2. Pharmacy to follow up in the morning. Labs reordered.  Lolita Rise, PharmD, BCPS Clinical Pharmacist 06/24/2023 6:27 PM

## 2023-06-24 NOTE — Progress Notes (Signed)
     Patient Name: Carlos Dawson           DOB: 1940-10-10  MRN: [id number]      Admission Date: 06/11/2023  Attending Provider: Liliane Rei, MD  Primary Diagnosis: Ileus [facility])   Level of care: Telemetry   CROSS COVERAGE NOTE       Date of Service   06/24/2023   Carlos CARSTARPHEN, 83 y.o. male, was admitted on 06/11/2023 for Ileus [facility]).   6/1-patient had complaints of worsening abdominal distention and pain.  KUB showed "dilated small bowel and right colon, favoring adynamic ileus over SBO."  Patient was made n.p.o. and NG tube was reinserted.  NG tube terminates in the gastric cardia, RN has been asked to advance 5 cm.   HPI/Events of Note   Atrial fibrillation with RVR At rest, HR 120-140s even after receiving scheduled IV Lopressor .   Heart rate has been noted to increase with activity and pain, will rise to 150-170s.  Bedside Assessment:  Patient is in bed.  He is awake, A/O, with no associated distress.  He denies lightheadedness, chest pain, palpitation, shortness of breath, nausea, vomiting. Endorses back pain and some abdominal relief after NGT placement.  ~ 300 cc green bile output.  Respiratory: Bilaterally diminished, no wheezing, no crackles. No accessory muscle use.  Cardiovascular: Irregularly irregular. BLE edema.  Warm and dry..  Abdomen: Abdomen is distended and mildly tender with palpation.  No rebound tenderness or guarding.    Addendum: There was no improvement to HR after being administered an additional 2.5 mg IV Lopressor .  Current BP 119/56, with HR 150s.  Will transfer to stepdown for Cardizem gtt..    Interventions/ Plan   IV Lopressor , 2.5 mg Transferred to SDU Cardizem gtt. Recommended patient remain at bedrest tonight given poor HR control       Bailey Lesser, DNP, ACNPC- AG [facility]

## 2023-06-24 NOTE — Progress Notes (Signed)
 Called to assist with nasogastric tube placement, tube placed and measured at 80cm per marking on tube. X-ray ordered to verify placement, but gastric content was visualized as soon as tube entered gastric area. Bridle placed, patient tolerated tube placement very well.   Please reach out to rapid response with any further concerns for questions.

## 2023-06-24 NOTE — Significant Event (Signed)
 During rapid response.

## 2023-06-24 NOTE — Progress Notes (Signed)
 Physical Therapy Treatment Patient Details Name: Carlos Dawson MRN: [id number] DOB: 03/03/1940 Today's Date: 06/24/2023   History of Present Illness patient is a 83 y.o. male who was admitted to the hospital by the orthopedic service on 5/19 after successful left hip total arthroplasty who is now being seen in consultation due to concern for hypotension, tachycardia and dyspnea with exertion. Transfer to SDU 5/22 due to A Fib. PMH:CAD, diastolic CHF, COPD on room air, history of DVT, paroxysmal atrial fibrillation status post Watchman procedure, no longer on anticoagulation    PT Comments   Pt continues motivated and progressing with mobility including decreasing assist level for most tasks and increasing activity tolerance. This pm, pt up to ambulate 128 and 33' with seated rest break between, performed therex program with assist, up to standing for RN to apply new dressing, and ambulated into bathroom.  Pt reports fatigue but pleased with progress.   If plan is discharge home, recommend the following: Assistance with cooking/housework;Help with stairs or ramp for entrance;Assist for transportation;A lot of help with walking and/or transfers;A lot of help with bathing/dressing/bathroom   Can travel by private vehicle        Equipment Recommendations  None recommended by PT    Recommendations for Other Services       Precautions / Restrictions Precautions Precautions: Fall Restrictions Weight Bearing Restrictions Per Provider Order: No LLE Weight Bearing Per Provider Order: Weight bearing as tolerated     Mobility  Bed Mobility               General bed mobility comments: Pt up in chair at beginning of session and in bathroom at session end    Transfers Overall transfer level: Needs assistance Equipment used: Rolling walker (2 wheels) Transfers: Sit to/from Stand Sit to Stand: Min assist           General transfer comment: VC's on proper hand placement to "push  up" vs "pull up" on walker. Also assisted on real toilet in bathroom.    Ambulation/Gait Ambulation/Gait assistance: Contact guard assist, Supervision Gait Distance (Feet): 128 Feet (128' followed by 15' and finally 15' into bathroom) Assistive device: Rolling walker (2 wheels) Gait Pattern/deviations: Antalgic, Step-to pattern, Step-through pattern, Decreased step length - right, Decreased step length - left, Trunk flexed       General Gait Details: min cues for sequence, posture, pace and position from [employer]             Wheelchair Mobility     Tilt Bed    Modified Rankin (Stroke Patients Only)       Balance Overall balance assessment: History of Falls, Needs assistance Sitting-balance support: Feet supported Sitting balance-Leahy Scale: Good     Standing balance support: Single extremity supported Standing balance-Leahy Scale: Poor                              Communication Communication Communication: Impaired Factors Affecting Communication: Hearing impaired  Cognition Arousal: Alert Behavior During Therapy: WFL for tasks assessed/performed   PT - Cognitive impairments: No apparent impairments                       PT - Cognition Comments: AxO x 3 pleasant and motivated.  Retired Arts development officer.  Lives with Spouse and was IND/Driving. Following commands: Intact      Cueing Cueing Techniques: Verbal cues  Exercises Total Joint Exercises  Ankle Circles/Pumps: AROM, Both, 15 reps, Supine Quad Sets: AROM, Both, Supine, 10 reps Heel Slides: AAROM, Both, Supine, 15 reps, 20 reps Hip ABduction/ADduction: AAROM, Both, 10 reps, Limitations    General Comments        Pertinent Vitals/Pain Pain Assessment Pain Assessment: 0-10 Pain Score: 4  Pain Location: bil hips and low back Pain Descriptors / Indicators: Discomfort Pain Intervention(s): Limited activity within patient's tolerance, Monitored during session, Premedicated before  session    Home Living                          Prior Function            PT Goals (current goals can now be found in the care plan section) Acute Rehab PT Goals Patient Stated Goal: walk farther, be outdoors PT Goal Formulation: With patient/family Time For Goal Achievement: 07/01/2023 Potential to Achieve Goals: Fair Progress towards PT goals: Progressing toward goals    Frequency    7X/week      PT Plan      Co-evaluation              AM-PAC PT "6 Clicks" Mobility   Outcome Measure  Help needed turning from your back to your side while in a flat bed without using bedrails?: A Lot Help needed moving from lying on your back to sitting on the side of a flat bed without using bedrails?: A Lot Help needed moving to and from a bed to a chair (including a wheelchair)?: A Lot Help needed standing up from a chair using your arms (e.g., wheelchair or bedside chair)?: A Little Help needed to walk in hospital room?: A Little Help needed climbing 3-5 steps with a railing? : A Lot 6 Click Score: 14    End of Session Equipment Utilized During Treatment: Gait belt Activity Tolerance: Patient tolerated treatment well;Patient limited by fatigue Patient left: Other (comment) (bathroom) Nurse Communication: Mobility status PT Visit Diagnosis: Difficulty in walking, not elsewhere classified (R26.2);Muscle weakness (generalized) (M62.81) Pain - Right/Left: Left Pain - part of body: Hip;Leg     Time: 1320-1401 PT Time Calculation (min) (ACUTE ONLY): 41 min  Charges:    $Gait Training: 23-37 mins $Therapeutic Exercise: 8-22 mins PT General Charges $$ ACUTE PT VISIT: 1 Visit                     Thedora Finlay PT Acute Rehabilitation Services Pager [phone] Office [phone]    Charina Fons 06/24/2023, 2:49 PM

## 2023-06-24 NOTE — Progress Notes (Signed)
 PROGRESS NOTE   Carlos Dawson  XBJ:[id number]    DOB: 06-Sep-1940    DOA: 06/11/2023  PCP: Tena Feeling, MD   I have briefly reviewed patients previous medical records in [facility].   Brief Hospital Course:  83 year old male with PMH of PAF no longer on anticoagulation, s/p Watchman device 06/15/2022, COPD/asthma not on home O2 prior to admission, OSA on nightly CPAP, anxiety, BPH, prostate cancer, anemia, GERD, DM, chronic diastolic CHF, obesity s/p lap band, HLD, DVT/PE, CAD s/p elective right total hip arthroplasty on 06/11/2023.  Postop course complicated by tachyarrhythmia, hypotension, anemia, ileus versus SBO.  Orthopedics are attending.  TRH/cardiology/GI and general surgery are consulting.  Treated conservatively with n.p.o./NG tube/PICC line with TPN.  Postop ileus resolved, NGT out, advancing diet.  Transferred to telemetry 5/31.   Assessment & Plan:   Postop ileus Managed conservatively with bowel rest/n.p.o., NG tube, PICC line with TPN. CT A/P 5/27: Detailed report appreciated.  Concern of distal SBO or ileus. Treated conservatively for several days with bowel rest/n.p.o., NG tube, TPN via RUE PICC line, replacing electrolytes, mobilizing, minimizing opioids.  SBO protocol NGT out 5/30.  Tolerating p.o. liquids, advancing diet.  Per discussion with Dr. Melton Squires, leaving on full liquids for today. Diarrhea improving as per patient report.  GI panel negative.  Avoid antimotility medications. CCS managing.  [facility] saw earlier in admission, signed off.  PAF with RVR/frequent PVCs/BBB morphology Cardiology following. Post hip surgery, he was noted to be hypotensive, tachycardic and thought to be in A-fib with RVR. S/p ablation 04/2017 and Watchman device 05/2022. While n.p.o., was on IV metoprolol .  Patient was switched to metoprolol  12.5 Mg twice daily on 5/31.  However did not get his a.m. dose in a timely manner for unclear reasons and got it late at around 11 AM. No  need for long-term anticoagulation.  Watchman device in place and functioning normally per cardiology.   Discussed with Dr. Lavonne Prairie While resting, rate controlled in the 100s.  With activity, rates up to 130s-140s. Patient evaluated along with Dr. Lavonne Prairie at bedside.  Discussed with him.  Increased metoprolol  to 25 Mg twice daily.  Acute on chronic diastolic CHF/Anasarca TTE 5/23: LVEF 70-75% Remains with some volume overload/anasarca-this is likely multifactorial due to hypoalbuminemia, IV fluid resuscitation and CHF.  Volume slowly improving. Due to hypotension on 5/29, rising BUN, held Lasix , gave IV albumin  12.5 g every 6 hours x 2 and monitor closely. Some dyspnea, leg edema.  Hypotension resolved.  Resumed home dose of Lasix  40 mg daily.  Hypotension SBP's in the mid 80s on 5/29 morning.   Resolved after holding IV Lasix , IV albumin   CAD s/p PCI As per cardiology, stress PET on 10/24 was a high risk study.  They recommend outpatient follow-up to consider ischemic evaluation Continue aspirin  and metoprolol .  Acute respiratory failure with hypoxia Suspect multifactorial Resolved.  Macrocytic anemia History of iron  deficiency anemia Hemoglobin dropped from 11.2 on 5/6-7.9 on 5/23.  S/p 1 unit of PRBC on 5/23. Hemoglobin stable in the 9 g range.  Hypokalemia Was persistent.  Better today.  Continue to replace, especially while resuming Lasix .  Attempt to keep K >4 and mag >2.  S/p left total hip arthroplasty on 5/19 Management per primary service. Not much pain.  Mobilizing with PT. Intermittent mild and transient pain when moving but otherwise pain controlled off of any meds. While n.p.o., was on Lovenox  for DVT prophylaxis, now back on Xarelto  10 Mg  daily as per orthopedics.  History of thoracic aortic aneurysm Outpatient follow-up  History of PE Per home med rec review, did not appear to be on anticoagulation PTA. While n.p.o., was on Lovenox  for DVT prophylaxis, now  back on Xarelto  10 Mg daily as per orthopedics.  BPH/history of prostate cancer Continue Flomax .  Obesity s/p lap banding Noted  OSA on nightly CPAP Continue CPAP  Delirium Likely multifactorial.  Delirium precautions.  Minimize opioids/sedatives.  Family at bedside would be helpful. Do not think that the minimally elevated ammonia 51 on 5/27 is of significance. TSH normal. Resolved.  Type II DM A1c 5.7 on 5/6 Remains on NovoLog  SSI.  CBGs reasonably controlled.  Since off of TPN, will reduce to sensitive SSI.  Elevated creatinine Creatinine of 0.98 on 5/6, meeting for stage II CKD Since hospital admission, creatinine has peaked to 1.26. Does not meet criteria for AKI. Resolved.  Body mass index is 38.76 kg/m./Obesity Complicates care.   DVT prophylaxis: rivaroxaban  (XARELTO ) tablet 10 mg Start: 06/23/23 1000 Place TED hose Start: 06/11/23 1651 SCDs Start: 06/11/23 1433     Code Status: Full Code:  Family Communication: Son and daughter-in-law at bedside. Disposition:  Advancing diet, A-fib needs to be better controlled.     Consultants:   [facility] Cardiology [facility] General Surgery  Procedures:   As noted above. PICC line discontinued NGT discontinued  Subjective:  Overnight events noted.  Long discussion with patient's nurse.  Not much left hip postop pain.  Mostly low back pain at site of previous surgery and has a small skin tear.  Restless and does not stay in a position for too long.  Coherent.  Tolerating full liquid diet.  Patient reports 5-6 BMs overnight, frequency and volume decreasing.  No abdominal pain, nausea or vomiting.  No chest pain or palpitations.  Mild dyspnea, suspecting while exerting.  Unable to use CPAP last night because machine was not fitting.  Mild leg swelling.  Difficulty urinating, small amount of concentrated urine in canister.  Bladder scan ordered overnight but not performed yet.  Objective:   Vitals:   06/23/23 2254 06/24/23  0114 06/24/23 0623 06/24/23 0627  BP:  105/76 117/63   Pulse: 87 (!) 58 (!) 44   Resp: 18 19 18    Temp:  97.6 F (36.4 C) 97.6 F (36.4 C)   TempSrc:  Oral Oral   SpO2:  95% 99%   Weight:    107.3 kg  Height:        General exam: Elderly male, moderately built and obese sitting up comfortably in reclining chair without distress. Respiratory system: Clear to auscultation.  No increased work of breathing. Cardiovascular system: S1 and S2 heard, RRR.  No JVD.  No pedal edema.  Trace bilateral lower thigh edema.  Telemetry personally reviewed: A-fib with controlled ventricular rate in the 100s overnight with occasional increase to 130s.  In the day yesterday had intermittent RVR up to 130s-140s.  Gastrointestinal system: Abdomen is nondistended, soft, not tense or tender.  Has palpable Lap-Band in the lower epigastric region.  No other surgical scars noted.  Normal bowel sounds heard.  No organomegaly or masses appreciated. Central nervous system: Alert and oriented x 3.  Answers all questions appropriately, speech much more clear. No focal neurological deficits. Extremities: Symmetric 5 x 5 power. Skin: No rashes, lesions or ulcers Psychiatry: Judgement and insight appear normal. Mood & affect appropriate.     Data Reviewed:   I have personally reviewed  following labs and imaging studies   CBC: Recent Labs  Lab 06/22/23 0643 06/23/23 0425 06/24/23 0424  WBC 9.5 10.2 11.9*  HGB 9.2* 8.6* 9.4*  HCT 27.4* 27.5* 31.0*  MCV 112.3* 110.4* 113.1*  PLT 204 182 179    Basic Metabolic Panel: Recent Labs  Lab 06/19/23 0405 06/20/23 0454 06/21/23 0441 06/22/23 0643 06/23/23 0425 06/24/23 0424  NA 133* 137 137 144 141 141  K 3.1* 3.1* 2.8* 3.1* 3.2* 3.6  CL 101 103 105 112* 112* 111  CO2 24 25 25 25 22 23   GLUCOSE 131* 182* 209* 194* 218* 142*  BUN 44* 52* 51* 38* 33* 35*  CREATININE 1.22 1.26* 1.19 1.08 0.85 0.94  CALCIUM  8.0* 8.5* 8.1* 8.6* 8.3* 8.8*  MG 2.1 2.4 2.4 2.1  2.0 2.0  PHOS 3.2 2.7 2.8 2.8  --  3.2    Liver Function Tests: Recent Labs  Lab 06/19/23 0405 06/20/23 0454 06/21/23 0441 06/23/23 0425  AST 17 15 14* 15  ALT 14 13 12 11   ALKPHOS 68 75 70 69  BILITOT 1.6* 1.5* 1.5* 1.3*  PROT 5.4* 5.6* 5.7* 5.6*  ALBUMIN  2.7* 3.0* 2.8* 2.9*    CBG: Recent Labs  Lab 06/23/23 2321 06/24/23 0405 06/24/23 0750  GLUCAP 132* 77 138*    Microbiology Studies:   Recent Results (from the past 240 hours)  MRSA Next Gen by PCR, Nasal     Status: None   Collection Time: 06/14/23  6:32 PM   Specimen: Nasal Mucosa; Nasal Swab  Result Value Ref Range Status   MRSA by PCR Next Gen NOT DETECTED NOT DETECTED Final    Comment: (NOTE) The GeneXpert MRSA Assay (FDA approved for NASAL specimens only), is one component of a comprehensive MRSA colonization surveillance program. It is not intended to diagnose MRSA infection nor to guide or monitor treatment for MRSA infections. Test performance is not FDA approved in patients less than 69 years old. Performed at [facility], [address], [place], [place] [address]   Gastrointestinal Panel by PCR , Stool     Status: None   Collection Time: 06/22/23  3:04 PM   Specimen: Stool  Result Value Ref Range Status   Campylobacter species NOT DETECTED NOT DETECTED Final   Plesimonas shigelloides NOT DETECTED NOT DETECTED Final   Salmonella species NOT DETECTED NOT DETECTED Final   Yersinia enterocolitica NOT DETECTED NOT DETECTED Final   Vibrio species NOT DETECTED NOT DETECTED Final   Vibrio cholerae NOT DETECTED NOT DETECTED Final   Enteroaggregative E coli (EAEC) NOT DETECTED NOT DETECTED Final   Enteropathogenic E coli (EPEC) NOT DETECTED NOT DETECTED Final   Enterotoxigenic E coli (ETEC) NOT DETECTED NOT DETECTED Final   Shiga like toxin producing E coli (STEC) NOT DETECTED NOT DETECTED Final   Shigella/Enteroinvasive E coli (EIEC) NOT DETECTED NOT DETECTED Final   Cryptosporidium  NOT DETECTED NOT DETECTED Final   Cyclospora cayetanensis NOT DETECTED NOT DETECTED Final   Entamoeba histolytica NOT DETECTED NOT DETECTED Final   Giardia lamblia NOT DETECTED NOT DETECTED Final   Adenovirus F40/41 NOT DETECTED NOT DETECTED Final   Astrovirus NOT DETECTED NOT DETECTED Final   Norovirus GI/GII NOT DETECTED NOT DETECTED Final   Rotavirus A NOT DETECTED NOT DETECTED Final   Sapovirus (I, II, IV, and V) NOT DETECTED NOT DETECTED Final    Comment: Performed at [facility], [address], [place], [place] [address]    Radiology Studies:  DG  CHEST PORT 1 VIEW Result Date: 06/23/2023 CLINICAL DATA:  PICC placement EXAM: PORTABLE CHEST 1 VIEW COMPARISON:  None Available. FINDINGS: The heart size and mediastinal contours are within normal limits. No pneumothorax or pleural effusion. Linear bibasilar subsegmental atelectasis or scarring. There are thoracic degenerative changes. Thoracolumbar spine fixation hardware is partially imaged. Right-sided PICC tip proximal SVC. IMPRESSION: PICC tip proximal SVC. Electronically Signed   By: Sydell Eva M.D.   On: 06/23/2023 11:59     Scheduled Meds:    aspirin  EC  81 mg Oral Daily   Chlorhexidine  Gluconate Cloth  6 each Topical Daily   cholecalciferol   5,000 Units Oral Daily   furosemide   40 mg Oral Daily   insulin  aspart  0-15 Units Subcutaneous Q4H   loratadine   10 mg Oral Daily   metoprolol  tartrate  25 mg Oral BID   pantoprazole   40 mg Oral Daily   potassium chloride   40 mEq Oral Daily   rivaroxaban   10 mg Oral Daily   senna  1 tablet Oral BID   sodium chloride  flush  10-40 mL Intracatheter Q12H   tamsulosin   0.4 mg Oral QPM   topiramate   50 mg Oral BID    Continuous Infusions:       LOS: 10 days     Aubrey Blas, MD,  FACP, [facility], [facility], [facility]   [facility] [place]      To contact the attending provider between 7A-7P or the covering provider during after  hours 7P-7A, please log into the web site [email or web address] and access using universal  password for that web site. If you do not have the password, please call the hospital operator.  06/24/2023, 8:26 AM

## 2023-06-24 NOTE — Progress Notes (Signed)
 13 Days Post-Op   Subjective/Chief Complaint: Doing well this AM Took min PO yesterday, but having bowel fxn, no n/v   Objective: Vital signs in last 24 hours: Temp:  [97.6 F (36.4 C)-98.7 F (37.1 C)] 97.6 F (36.4 C) (06/01 0623) Pulse Rate:  [43-92] 44 (06/01 0623) Resp:  [17-20] 18 (06/01 0623) BP: (102-130)/(58-82) 117/63 (06/01 0623) SpO2:  [92 %-100 %] 99 % (06/01 0623) FiO2 (%):  [30 %] 30 % (05/31 2254) Weight:  [107.3 kg] 107.3 kg (06/01 0627) Last BM Date : 06/24/23  Intake/Output from previous day: 05/31 0701 - 06/01 0700 In: 1226 [P.O.:1110; I.V.:116] Out: 0  Intake/Output this shift: No intake/output data recorded.  PE:  Constitutional: No acute distress, conversant, appears states age. Eyes: Anicteric sclerae, moist conjunctiva, no lid lag Lungs: Clear to auscultation bilaterally, normal respiratory effort CV: regular rate and rhythm, no murmurs, no peripheral edema, pedal pulses 2+ GI: Soft, no masses or hepatosplenomegaly, non-tender to palpation Skin: No rashes, palpation reveals normal turgor Psychiatric: appropriate judgment and insight, oriented to person, place, and time   Lab Results:  Recent Labs    06/23/23 0425 06/24/23 0424  WBC 10.2 11.9*  HGB 8.6* 9.4*  HCT 27.5* 31.0*  PLT 182 179   BMET Recent Labs    06/23/23 0425 06/24/23 0424  NA 141 141  K 3.2* 3.6  CL 112* 111  CO2 22 23  GLUCOSE 218* 142*  BUN 33* 35*  CREATININE 0.85 0.94  CALCIUM  8.3* 8.8*   PT/INR No results for input(s): "LABPROT", "INR" in the last 72 hours. ABG No results for input(s): "PHART", "HCO3" in the last 72 hours.  Invalid input(s): "PCO2", "PO2"  Studies/Results: DG CHEST PORT 1 VIEW Result Date: 06/23/2023 CLINICAL DATA:  PICC placement EXAM: PORTABLE CHEST 1 VIEW COMPARISON:  None Available. FINDINGS: The heart size and mediastinal contours are within normal limits. No pneumothorax or pleural effusion. Linear bibasilar subsegmental  atelectasis or scarring. There are thoracic degenerative changes. Thoracolumbar spine fixation hardware is partially imaged. Right-sided PICC tip proximal SVC. IMPRESSION: PICC tip proximal SVC. Electronically Signed   By: Sydell Eva M.D.   On: 06/23/2023 11:59    Anti-infectives: Anti-infectives (From admission, onward)    Start     Dose/Rate Route Frequency Ordered Stop   06/11/23 1830  ceFAZolin  (ANCEF ) IVPB 2g/100 mL premix        2 g 200 mL/hr over 30 Minutes Intravenous Every 6 hours 06/11/23 1651 06/12/23 0747   06/11/23 1030  ceFAZolin  (ANCEF ) IVPB 2g/100 mL premix        2 g 200 mL/hr over 30 Minutes Intravenous On call to O.R. 06/11/23 1025 06/11/23 1300       Assessment/Plan: s/p Procedure(s): ARTHROPLASTY, HIP, TOTAL, ANTERIOR APPROACH (Left) Partial SBO vs ileus -appears to have resolved with multiple bowel movements.  GI panel pending.             - multiple BM's             - con't fulls today and likely OK to transition to soft in AM if tol FLD today  LOS: 10 days    Shela Derby 06/24/2023

## 2023-06-24 NOTE — Plan of Care (Signed)
  Problem: Coping: Goal: Level of anxiety will decrease Outcome: Progressing   Problem: Pain Managment: Goal: General experience of comfort will improve and/or be controlled Outcome: Progressing   Problem: Clinical Measurements: Goal: Diagnostic test results will improve Outcome: Progressing Goal: Respiratory complications will improve Outcome: Progressing Goal: Cardiovascular complication will be avoided Outcome: Progressing

## 2023-06-24 NOTE — Progress Notes (Signed)
 Progress Note  Patient Name: Carlos Dawson Date of Encounter: 06/24/2023  Primary Cardiologist:   Peter [name], MD   Subjective   Rapid heart rate with ambulation yesterday.  He was acutely SOB.   Inpatient Medications    Scheduled Meds:  aspirin  EC  81 mg Oral Daily   Chlorhexidine  Gluconate Cloth  6 each Topical Daily   cholecalciferol   5,000 Units Oral Daily   insulin  aspart  0-15 Units Subcutaneous Q4H   loratadine   10 mg Oral Daily   metoprolol  tartrate  12.5 mg Oral BID   pantoprazole   40 mg Oral Daily   rivaroxaban   10 mg Oral Daily   senna  1 tablet Oral BID   sodium chloride  flush  10-40 mL Intracatheter Q12H   tamsulosin   0.4 mg Oral QPM   topiramate   50 mg Oral BID   Continuous Infusions:  TPN ADULT (ION) Stopped (06/23/23 1846)   PRN Meds: acetaminophen , albuterol , alum & mag hydroxide-simeth, budesonide -formoterol , HYDROmorphone  (DILAUDID ) injection, lip balm, magnesium  citrate, menthol -cetylpyridinium **OR** phenol, nitroGLYCERIN , ondansetron  **OR** ondansetron  (ZOFRAN ) IV, mouth rinse, polyethylene glycol, simethicone , sodium chloride , sodium chloride  flush, tiotropium, tiZANidine , traMADol    Vital Signs    Vitals:   06/23/23 2254 06/24/23 0114 06/24/23 0623 06/24/23 0627  BP:  105/76 117/63   Pulse: 87 (!) 58 (!) 44   Resp: 18 19 18    Temp:  97.6 F (36.4 C) 97.6 F (36.4 C)   TempSrc:  Oral Oral   SpO2:  95% 99%   Weight:    107.3 kg  Height:        Intake/Output Summary (Last 24 hours) at 06/24/2023 0803 Last data filed at 06/24/2023 3762 Gross per 24 hour  Intake 1112.64 ml  Output 0 ml  Net 1112.64 ml   Filed Weights   06/22/23 0349 06/23/23 0400 06/24/23 0627  Weight: 106.7 kg 104.4 kg 107.3 kg    Telemetry    Atrial fib with rapid rate intermittent.  Sinus beats as well with MAT.  - Personally Reviewed  ECG    Atrial fib, rate 103, RBBB - Personally Reviewed  Physical Exam   GENERAL:  Well appearing NECK:  No jugular  venous distention, waveform within normal limits, carotid upstroke brisk and symmetric, no bruits, no thyromegaly LUNGS:  Clear to auscultation bilaterally CHEST:  Unremarkable HEART:  PMI not displaced or sustained,S1 and S2 within normal limits, no S3, no clicks, no rubs, no murmurs, irregular  ABD:  Flat, positive bowel sounds normal in frequency in pitch, no bruits, no rebound, no guarding, no midline pulsatile mass, no hepatomegaly, no splenomegaly EXT:  2 plus pulses throughout, no edema, no cyanosis no clubbing   Labs    Chemistry Recent Labs  Lab 06/20/23 0454 06/21/23 0441 06/22/23 0643 06/23/23 0425 06/24/23 0424  NA 137 137 144 141 141  K 3.1* 2.8* 3.1* 3.2* 3.6  CL 103 105 112* 112* 111  CO2 25 25 25 22 23   GLUCOSE 182* 209* 194* 218* 142*  BUN 52* 51* 38* 33* 35*  CREATININE 1.26* 1.19 1.08 0.85 0.94  CALCIUM  8.5* 8.1* 8.6* 8.3* 8.8*  PROT 5.6* 5.7*  --  5.6*  --   ALBUMIN  3.0* 2.8*  --  2.9*  --   AST 15 14*  --  15  --   ALT 13 12  --  11  --   ALKPHOS 75 70  --  69  --   BILITOT 1.5* 1.5*  --  1.3*  --   GFRNONAA 57* >60 >60 >60 >60  ANIONGAP 9 7 7 7 7      Hematology Recent Labs  Lab 06/22/23 0643 06/23/23 0425 06/24/23 0424  WBC 9.5 10.2 11.9*  RBC 2.44* 2.49* 2.74*  HGB 9.2* 8.6* 9.4*  HCT 27.4* 27.5* 31.0*  MCV 112.3* 110.4* 113.1*  MCH 37.7* 34.5* 34.3*  MCHC 33.6 31.3 30.3  RDW 17.5* 17.3* 17.5*  PLT 204 182 179    Cardiac EnzymesNo results for input(s): "TROPONINI" in the last 168 hours. No results for input(s): "TROPIPOC" in the last 168 hours.   BNP Recent Labs  Lab 06/19/23 1742  BNP 300.9*     DDimer No results for input(s): "DDIMER" in the last 168 hours.   Radiology    DG CHEST PORT 1 VIEW Result Date: 06/23/2023 CLINICAL DATA:  PICC placement EXAM: PORTABLE CHEST 1 VIEW COMPARISON:  None Available. FINDINGS: The heart size and mediastinal contours are within normal limits. No pneumothorax or pleural effusion. Linear  bibasilar subsegmental atelectasis or scarring. There are thoracic degenerative changes. Thoracolumbar spine fixation hardware is partially imaged. Right-sided PICC tip proximal SVC. IMPRESSION: PICC tip proximal SVC. Electronically Signed   By: Sydell Eva M.D.   On: 06/23/2023 11:59    Cardiac Studies   Echo:  1. Technically difficult; ascending aorta appears to be moderately  dilated; suggest CTA or MRA to further assess; no pericardial effusion  noted.   2. Left ventricular ejection fraction, by estimation, is 70 to 75%. The  left ventricle has hyperdynamic function. The left ventricle has no  regional wall motion abnormalities. Left ventricular diastolic parameters  are indeterminate.   3. Right ventricular systolic function is normal. The right ventricular  size is normal.   4. The mitral valve is normal in structure. No evidence of mitral valve  regurgitation. No evidence of mitral stenosis.   5. The aortic valve was not well visualized. Aortic valve regurgitation  is not visualized. No aortic stenosis is present.   6. There is mild dilatation of the aortic root, measuring 40 mm. There is  moderate dilatation of the ascending aorta, measuring 46 mm.   Patient Profile     83 y.o. male  history of atrial fibrillation status post ablation in 2019 and Watchman in 05/2022 (due to hematuria), CAD status post PCI in 2012, chronic diastolic heart failure, PE, OSA, T2DM, thoracic aortic aneurysm who we are consulted for evaluation of atrial fibrillation     Assessment & Plan    PAF:  Continued PAF.  He has a Watchman so no indication for systemic anticoagulation.  Rates elevated yesterday and he required oral and IV metoprolol .  Increase beta blocker to 25 mg.    Acute on chronic diastolic HF:  Net negative 4681 cc.  Restart Jardiance .    CAD:  No active ischemia symptoms.    For questions or updates, please contact [facility] Please consult [email or web address] for contact info  under Cardiology/STEMI.   Signed, Eilleen Grates, MD  06/24/2023, 8:03 AM

## 2023-06-24 NOTE — Progress Notes (Incomplete)
.  acma

## 2023-06-24 NOTE — Plan of Care (Signed)
  Problem: Clinical Measurements: Goal: Ability to maintain clinical measurements within normal limits will improve Outcome: Progressing Goal: Will remain free from infection Outcome: Progressing   Problem: Coping: Goal: Level of anxiety will decrease Outcome: Progressing   Problem: Pain Managment: Goal: General experience of comfort will improve and/or be controlled Outcome: Progressing   Problem: Safety: Goal: Ability to remain free from injury will improve Outcome: Progressing

## 2023-06-24 NOTE — Progress Notes (Signed)
 Dr Sherrod Dolphin at bedside after notifying him that pt was more distended and c/o severe cramping in abdomen. MD aware pain medication given and that pt had been walking some yet passing little to no gas. MD notified Dr Delane Fear and order bedside KUB and NPO. Pt back to bed. Reassurance given to wife and pt.

## 2023-06-24 NOTE — Progress Notes (Signed)
   Subjective: 13 Days Post-Op Procedure(s) (LRB): ARTHROPLASTY, HIP, TOTAL, ANTERIOR APPROACH (Left)  Pt sitting in chair with mild dyspnea Reviewed the events of his heart rate Ileus and abdominal pain have improved Left hip with minimal pain currently Therapy has been helpful Patient reports pain as mild.  Objective:   VITALS:   Vitals:   06/24/23 0114 06/24/23 0623  BP: 105/76 117/63  Pulse: (!) 58 (!) 44  Resp: 19 18  Temp: 97.6 F (36.4 C) 97.6 F (36.4 C)  SpO2: 95% 99%    Left hip incision healing well Nv intact distally No rashes or edema distally Good rom without pain or guarding  LABS Recent Labs    06/22/23 0643 06/23/23 0425 06/24/23 0424  HGB 9.2* 8.6* 9.4*  HCT 27.4* 27.5* 31.0*  WBC 9.5 10.2 11.9*  PLT 204 182 179    Recent Labs    06/22/23 0643 06/23/23 0425 06/24/23 0424  NA 144 141 141  K 3.1* 3.2* 3.6  BUN 38* 33* 35*  CREATININE 1.08 0.85 0.94  GLUCOSE 194* 218* 142*     Assessment/Plan: 13 Days Post-Op Procedure(s) (LRB): ARTHROPLASTY, HIP, TOTAL, ANTERIOR APPROACH (Left) Continue PT/OT Defer to medical team for respiratory issues and heart rate Will continue to monitor his progress Family at bedside    Lorina Roosevelt PA-C, MPAS [facility] is now [facility]  [place] [address], [place], [place] [address] Phone: [phone] [email or web address] Facebook  [employer]

## 2023-06-24 NOTE — Progress Notes (Signed)
 Dr Sherrod Dolphin still on floor putting orders in EPIC, including replacing NGT after talking to Dr. Delane Fear about the KUB results.

## 2023-06-24 NOTE — Progress Notes (Addendum)
 Attempted multiple times to put NG tube down with no success because tube kept coiling in patients mouth. Three different nurses attempted and we called ICU charge nurse to try.

## 2023-06-24 DEATH — deceased

## 2023-06-25 ENCOUNTER — Telehealth

## 2023-06-25 ENCOUNTER — Other Ambulatory Visit: Payer: Self-pay

## 2023-06-25 ENCOUNTER — Inpatient Hospital Stay (HOSPITAL_COMMUNITY)

## 2023-06-25 DIAGNOSIS — I4891 Unspecified atrial fibrillation: Secondary | ICD-10-CM | POA: Diagnosis not present

## 2023-06-25 DIAGNOSIS — J9602 Acute respiratory failure with hypercapnia: Secondary | ICD-10-CM | POA: Diagnosis not present

## 2023-06-25 DIAGNOSIS — R579 Shock, unspecified: Secondary | ICD-10-CM

## 2023-06-25 DIAGNOSIS — N179 Acute kidney failure, unspecified: Secondary | ICD-10-CM

## 2023-06-25 DIAGNOSIS — R918 Other nonspecific abnormal finding of lung field: Secondary | ICD-10-CM | POA: Diagnosis not present

## 2023-06-25 DIAGNOSIS — J9601 Acute respiratory failure with hypoxia: Secondary | ICD-10-CM | POA: Diagnosis not present

## 2023-06-25 DIAGNOSIS — K567 Ileus, unspecified: Secondary | ICD-10-CM | POA: Diagnosis not present

## 2023-06-25 DIAGNOSIS — M1612 Unilateral primary osteoarthritis, left hip: Secondary | ICD-10-CM | POA: Diagnosis not present

## 2023-06-25 DIAGNOSIS — R570 Cardiogenic shock: Secondary | ICD-10-CM

## 2023-06-25 DIAGNOSIS — Z66 Do not resuscitate: Secondary | ICD-10-CM

## 2023-06-25 DIAGNOSIS — L8990 Pressure ulcer of unspecified site, unspecified stage: Secondary | ICD-10-CM | POA: Insufficient documentation

## 2023-06-25 DIAGNOSIS — Z515 Encounter for palliative care: Secondary | ICD-10-CM

## 2023-06-25 DIAGNOSIS — I469 Cardiac arrest, cause unspecified: Secondary | ICD-10-CM | POA: Diagnosis not present

## 2023-06-25 DIAGNOSIS — R0902 Hypoxemia: Secondary | ICD-10-CM | POA: Diagnosis not present

## 2023-06-25 DIAGNOSIS — K56609 Unspecified intestinal obstruction, unspecified as to partial versus complete obstruction: Secondary | ICD-10-CM

## 2023-06-25 DIAGNOSIS — I480 Paroxysmal atrial fibrillation: Secondary | ICD-10-CM | POA: Diagnosis not present

## 2023-06-25 DIAGNOSIS — Z7189 Other specified counseling: Secondary | ICD-10-CM

## 2023-06-25 DIAGNOSIS — Z4682 Encounter for fitting and adjustment of non-vascular catheter: Secondary | ICD-10-CM | POA: Diagnosis not present

## 2023-06-25 LAB — COMPREHENSIVE METABOLIC PANEL WITH GFR
ALT: 16 U/L (ref 0–44)
ALT: 27 U/L (ref 0–44)
AST: 19 U/L (ref 15–41)
AST: 81 U/L — ABNORMAL HIGH (ref 15–41)
Albumin: 3.1 g/dL — ABNORMAL LOW (ref 3.5–5.0)
Albumin: 2.3 g/dL — ABNORMAL LOW (ref 3.5–5.0)
Alkaline Phosphatase: 93 U/L (ref 38–126)
Alkaline Phosphatase: 110 U/L (ref 38–126)
Anion gap: 3 — ABNORMAL LOW (ref 5–15)
Anion gap: 15 (ref 5–15)
BUN: 36 mg/dL — ABNORMAL HIGH (ref 8–23)
BUN: 36 mg/dL — ABNORMAL HIGH (ref 8–23)
CO2: 26 mmol/L (ref 22–32)
CO2: 16 mmol/L — ABNORMAL LOW (ref 22–32)
Calcium: 8.4 mg/dL — ABNORMAL LOW (ref 8.9–10.3)
Calcium: 8.7 mg/dL — ABNORMAL LOW (ref 8.9–10.3)
Chloride: 110 mmol/L (ref 98–111)
Chloride: 113 mmol/L — ABNORMAL HIGH (ref 98–111)
Creatinine, Ser: 1.12 mg/dL (ref 0.61–1.24)
Creatinine, Ser: 1.54 mg/dL — ABNORMAL HIGH (ref 0.61–1.24)
GFR, Estimated: >60 mL/min (ref >60)
GFR, Estimated: 45 mL/min — ABNORMAL LOW (ref >60)
Glucose, Bld: 177 mg/dL — ABNORMAL HIGH (ref 70–99)
Glucose, Bld: 203 mg/dL — ABNORMAL HIGH (ref 70–99)
Potassium: 4.3 mmol/L (ref 3.5–5.1)
Potassium: 4.9 mmol/L (ref 3.5–5.1)
Sodium: 139 mmol/L (ref 135–145)
Sodium: 144 mmol/L (ref 135–145)
Total Bilirubin: 1.6 mg/dL — ABNORMAL HIGH (ref 0.0–1.2)
Total Bilirubin: 1.2 mg/dL (ref 0.0–1.2)
Total Protein: 5.9 g/dL — ABNORMAL LOW (ref 6.5–8.1)
Total Protein: 4.5 g/dL — ABNORMAL LOW (ref 6.5–8.1)

## 2023-06-25 LAB — TYPE AND SCREEN
ABO/RH(D): O POS
Antibody Screen: NEGATIVE

## 2023-06-25 LAB — MAGNESIUM
Magnesium: 2.1 mg/dL (ref 1.7–2.4)
Magnesium: 1.9 mg/dL (ref 1.7–2.4)

## 2023-06-25 LAB — LACTIC ACID, PLASMA
Lactic Acid, Venous: 6.7 mmol/L (ref 0.5–1.9)
Lactic Acid, Venous: 4.5 mmol/L (ref 0.5–1.9)
Lactic Acid, Venous: 2.4 mmol/L (ref 0.5–1.9)
Lactic Acid, Venous: 2 mmol/L (ref 0.5–1.9)

## 2023-06-25 LAB — CBC
HCT: 32.4 % — ABNORMAL LOW (ref 39.0–52.0)
HCT: 25.1 % — ABNORMAL LOW (ref 39.0–52.0)
HCT: 29.7 % — ABNORMAL LOW (ref 39.0–52.0)
Hemoglobin: 10 g/dL — ABNORMAL LOW (ref 13.0–17.0)
Hemoglobin: 8.1 g/dL — ABNORMAL LOW (ref 13.0–17.0)
Hemoglobin: 8.8 g/dL — ABNORMAL LOW (ref 13.0–17.0)
MCH: 36.4 pg — ABNORMAL HIGH (ref 26.0–34.0)
MCH: 38.8 pg — ABNORMAL HIGH (ref 26.0–34.0)
MCH: 34.2 pg — ABNORMAL HIGH (ref 26.0–34.0)
MCHC: 30.9 g/dL (ref 30.0–36.0)
MCHC: 32.3 g/dL (ref 30.0–36.0)
MCHC: 29.6 g/dL — ABNORMAL LOW (ref 30.0–36.0)
MCV: 117.8 fL — ABNORMAL HIGH (ref 80.0–100.0)
MCV: 120.1 fL — ABNORMAL HIGH (ref 80.0–100.0)
MCV: 115.6 fL — ABNORMAL HIGH (ref 80.0–100.0)
Platelets: 243 10*3/uL (ref 150–400)
Platelets: 189 10*3/uL (ref 150–400)
Platelets: 221 10*3/uL (ref 150–400)
RBC: 2.75 MIL/uL — ABNORMAL LOW (ref 4.22–5.81)
RBC: 2.09 MIL/uL — ABNORMAL LOW (ref 4.22–5.81)
RBC: 2.57 MIL/uL — ABNORMAL LOW (ref 4.22–5.81)
RDW: 17.7 % — ABNORMAL HIGH (ref 11.5–15.5)
RDW: 17.6 % — ABNORMAL HIGH (ref 11.5–15.5)
RDW: 17.3 % — ABNORMAL HIGH (ref 11.5–15.5)
WBC: 28.9 10*3/uL — ABNORMAL HIGH (ref 4.0–10.5)
WBC: 32.1 10*3/uL — ABNORMAL HIGH (ref 4.0–10.5)
WBC: 29.8 10*3/uL — ABNORMAL HIGH (ref 4.0–10.5)
nRBC: 0.6 % — ABNORMAL HIGH (ref 0.0–0.2)
nRBC: 4.7 % — ABNORMAL HIGH (ref 0.0–0.2)
nRBC: 2.2 % — ABNORMAL HIGH (ref 0.0–0.2)

## 2023-06-25 LAB — BLOOD GAS, ARTERIAL
Acid-base deficit: 8.7 mmol/L — ABNORMAL HIGH (ref 0.0–2.0)
Acid-base deficit: 9 mmol/L — ABNORMAL HIGH (ref 0.0–2.0)
Bicarbonate: 18.8 mmol/L — ABNORMAL LOW (ref 20.0–28.0)
Bicarbonate: 18 mmol/L — ABNORMAL LOW (ref 20.0–28.0)
Drawn by: 72603
Drawn by: 25770
FIO2: 100 %
FIO2: 70 %
MECHVT: 540 mL
MECHVT: 540 mL
O2 Saturation: 99 %
O2 Saturation: 98.2 %
PEEP: 14 cmH2O
PEEP: 14 cmH2O
Patient temperature: 36.8
Patient temperature: 38.2
RATE: 14 {breaths}/min
RATE: 14 {breaths}/min
pCO2 arterial: 47 mmHg (ref 32–48)
pCO2 arterial: 45 mmHg (ref 32–48)
pH, Arterial: 7.21 — ABNORMAL LOW (ref 7.35–7.45)
pH, Arterial: 7.21 — ABNORMAL LOW (ref 7.35–7.45)
pO2, Arterial: 290 mmHg — ABNORMAL HIGH (ref 83–108)
pO2, Arterial: 84 mmHg (ref 83–108)

## 2023-06-25 LAB — TROPONIN I (HIGH SENSITIVITY)
Troponin I (High Sensitivity): 43 ng/L — ABNORMAL HIGH (ref <18)
Troponin I (High Sensitivity): 83 ng/L — ABNORMAL HIGH (ref <18)
Troponin I (High Sensitivity): 113 ng/L (ref <18)

## 2023-06-25 LAB — PHOSPHORUS: Phosphorus: 9 mg/dL — ABNORMAL HIGH (ref 2.5–4.6)

## 2023-06-25 LAB — BASIC METABOLIC PANEL WITH GFR
Anion gap: 6 (ref 5–15)
BUN: 40 mg/dL — ABNORMAL HIGH (ref 8–23)
CO2: 19 mmol/L — ABNORMAL LOW (ref 22–32)
Calcium: 7.7 mg/dL — ABNORMAL LOW (ref 8.9–10.3)
Chloride: 113 mmol/L — ABNORMAL HIGH (ref 98–111)
Creatinine, Ser: 1.53 mg/dL — ABNORMAL HIGH (ref 0.61–1.24)
GFR, Estimated: 45 mL/min — ABNORMAL LOW (ref >60)
Glucose, Bld: 251 mg/dL — ABNORMAL HIGH (ref 70–99)
Potassium: 4.1 mmol/L (ref 3.5–5.1)
Sodium: 138 mmol/L (ref 135–145)

## 2023-06-25 LAB — GLUCOSE, CAPILLARY
Glucose-Capillary: 253 mg/dL — ABNORMAL HIGH (ref 70–99)
Glucose-Capillary: 250 mg/dL — ABNORMAL HIGH (ref 70–99)
Glucose-Capillary: 266 mg/dL — ABNORMAL HIGH (ref 70–99)

## 2023-06-25 LAB — AMMONIA: Ammonia: 37 umol/L — ABNORMAL HIGH (ref 9–35)

## 2023-06-25 LAB — BRAIN NATRIURETIC PEPTIDE: B Natriuretic Peptide: 491.9 pg/mL — ABNORMAL HIGH (ref 0.0–100.0)

## 2023-06-25 LAB — D-DIMER, QUANTITATIVE: D-Dimer, Quant: 1.57 ug{FEU}/mL — ABNORMAL HIGH (ref 0.00–0.50)

## 2023-06-25 LAB — PROTIME-INR
INR: 2.1 — ABNORMAL HIGH (ref 0.8–1.2)
Prothrombin Time: 24.2 s — ABNORMAL HIGH (ref 11.4–15.2)

## 2023-06-25 LAB — TRIGLYCERIDES: Triglycerides: 62 mg/dL (ref <150)

## 2023-06-25 MED ORDER — NALOXONE HCL 0.4 MG/ML IJ SOLN
INTRAMUSCULAR | Status: AC
  Administered 2023-06-25: 0.4 mg
  Filled 2023-06-25: qty 1

## 2023-06-25 MED ORDER — LACTATED RINGERS IV BOLUS
1000.0000 mL | Freq: Once | INTRAVENOUS | Status: AC
  Administered 2023-06-25: 1000 mL via INTRAVENOUS

## 2023-06-25 MED ORDER — IOHEXOL 350 MG/ML SOLN
80.0000 mL | Freq: Once | INTRAVENOUS | Status: AC | PRN
  Administered 2023-06-25: 80 mL via INTRAVENOUS

## 2023-06-25 MED ORDER — EPINEPHRINE HCL 5 MG/250ML IV SOLN IN NS
0.5000 ug/min | INTRAVENOUS | Status: DC
  Administered 2023-06-25 (×2): 15 ug/min via INTRAVENOUS
  Filled 2023-06-25 (×3): qty 250

## 2023-06-25 MED ORDER — ORAL CARE MOUTH RINSE
15.0000 mL | OROMUCOSAL | Status: DC
  Administered 2023-06-25 (×2): 15 mL via OROMUCOSAL

## 2023-06-25 MED ORDER — AMIODARONE LOAD VIA INFUSION
150.0000 mg | Freq: Once | INTRAVENOUS | Status: AC
  Filled 2023-06-25: qty 83.34

## 2023-06-25 MED ORDER — PIPERACILLIN-TAZOBACTAM 3.375 G IVPB
3.3750 g | Freq: Three times a day (TID) | INTRAVENOUS | Status: DC
  Administered 2023-06-25: 3.375 g via INTRAVENOUS
  Filled 2023-06-25: qty 50

## 2023-06-25 MED ORDER — VASOPRESSIN 20 UNITS/100 ML INFUSION FOR SHOCK
0.0000 [IU]/min | INTRAVENOUS | Status: DC
  Administered 2023-06-25 (×2): 0.04 [IU]/min via INTRAVENOUS
  Filled 2023-06-25 (×2): qty 100

## 2023-06-25 MED ORDER — ADENOSINE 6 MG/2ML IV SOLN
INTRAVENOUS | Status: AC
  Filled 2023-06-25: qty 2

## 2023-06-25 MED ORDER — PANTOPRAZOLE SODIUM 40 MG IV SOLR: 40.0000 mg | Freq: Every day | INTRAVENOUS | Status: DC

## 2023-06-25 MED ORDER — SODIUM BICARBONATE 8.4 % IV SOLN
INTRAVENOUS | Status: AC
  Filled 2023-06-25: qty 50

## 2023-06-25 MED ORDER — HEPARIN (PORCINE) 25000 UT/250ML-% IV SOLN: 1450.0000 [IU]/h | INTRAVENOUS | Status: DC

## 2023-06-25 MED ORDER — PHENYLEPHRINE HCL-NACL 20-0.9 MG/250ML-% IV SOLN
INTRAVENOUS | Status: AC
  Administered 2023-06-25: 25 ug/min via INTRAVENOUS
  Filled 2023-06-25: qty 250

## 2023-06-25 MED ORDER — EPINEPHRINE 1 MG/10ML IJ SOSY
PREFILLED_SYRINGE | INTRAMUSCULAR | Status: AC
  Filled 2023-06-25: qty 10

## 2023-06-25 MED ORDER — SODIUM CHLORIDE 0.9 % IV BOLUS
1000.0000 mL | Freq: Once | INTRAVENOUS | Status: AC
  Administered 2023-06-25: 1000 mL via INTRAVENOUS

## 2023-06-25 MED ORDER — GLYCOPYRROLATE 0.2 MG/ML IJ SOLN
0.2000 mg | INTRAMUSCULAR | Status: DC | PRN
Start: 2023-06-25 — End: 2023-06-26

## 2023-06-25 MED ORDER — INSULIN ASPART 100 UNIT/ML IJ SOLN
0.0000 [IU] | INTRAMUSCULAR | Status: DC
  Administered 2023-06-25: 11 [IU] via SUBCUTANEOUS

## 2023-06-25 MED ORDER — AMIODARONE HCL IN DEXTROSE 360-4.14 MG/200ML-% IV SOLN
30.0000 mg/h | INTRAVENOUS | Status: DC
  Filled 2023-06-25: qty 200

## 2023-06-25 MED ORDER — ACETAMINOPHEN 650 MG RE SUPP: 650.0000 mg | Freq: Four times a day (QID) | RECTAL | Status: DC | PRN

## 2023-06-25 MED ORDER — HEPARIN BOLUS VIA INFUSION
3000.0000 [IU] | Freq: Once | INTRAVENOUS | Status: AC
  Administered 2023-06-25: 3000 [IU] via INTRAVENOUS
  Filled 2023-06-25: qty 3000

## 2023-06-25 MED ORDER — TRAVASOL 10 % IV SOLN
INTRAVENOUS | Status: DC
  Filled 2023-06-25: qty 499.2

## 2023-06-25 MED ORDER — GLYCOPYRROLATE 1 MG PO TABS
1.0000 mg | ORAL_TABLET | ORAL | Status: DC | PRN
Start: 2023-06-25 — End: 2023-06-26

## 2023-06-25 MED ORDER — SODIUM CHLORIDE 0.9 % IV SOLN: INTRAVENOUS | Status: DC | PRN

## 2023-06-25 MED ORDER — POTASSIUM CHLORIDE 2 MEQ/ML IV SOLN: INTRAVENOUS | Status: DC

## 2023-06-25 MED ORDER — LACTATED RINGERS IV SOLN: INTRAVENOUS | Status: DC

## 2023-06-25 MED ORDER — FENTANYL 2500MCG IN NS 250ML (10MCG/ML) PREMIX INFUSION: 0.0000 ug/h | INTRAVENOUS | Status: DC

## 2023-06-25 MED ORDER — AMIODARONE HCL IN DEXTROSE 360-4.14 MG/200ML-% IV SOLN
INTRAVENOUS | Status: AC
  Administered 2023-06-25: 150 mg via INTRAVENOUS
  Filled 2023-06-25: qty 200

## 2023-06-25 MED ORDER — HYDROCORTISONE SOD SUC (PF) 100 MG IJ SOLR
50.0000 mg | Freq: Four times a day (QID) | INTRAMUSCULAR | Status: DC
  Administered 2023-06-25 (×2): 50 mg via INTRAVENOUS
  Filled 2023-06-25 (×3): qty 2

## 2023-06-25 MED ORDER — NOREPINEPHRINE 4 MG/250ML-% IV SOLN: 0.0000 ug/min | INTRAVENOUS | Status: DC

## 2023-06-25 MED ORDER — ORAL CARE MOUTH RINSE
15.0000 mL | OROMUCOSAL | Status: DC | PRN
Start: 2023-06-25 — End: 2023-06-26

## 2023-06-25 MED ORDER — AMIODARONE HCL IN DEXTROSE 360-4.14 MG/200ML-% IV SOLN
60.0000 mg/h | INTRAVENOUS | Status: AC
  Administered 2023-06-25 (×2): 60 mg/h via INTRAVENOUS
  Filled 2023-06-25: qty 200

## 2023-06-25 MED ORDER — HEPARIN (PORCINE) 25000 UT/250ML-% IV SOLN
1450.0000 [IU]/h | INTRAVENOUS | Status: DC
  Administered 2023-06-25: 1450 [IU]/h via INTRAVENOUS
  Filled 2023-06-25: qty 250

## 2023-06-25 MED ORDER — SODIUM CHLORIDE 0.9 % IV SOLN
250.0000 mL | INTRAVENOUS | Status: DC
  Administered 2023-06-25: 250 mL via INTRAVENOUS

## 2023-06-25 MED ORDER — PHENYLEPHRINE HCL-NACL 20-0.9 MG/250ML-% IV SOLN
25.0000 ug/min | INTRAVENOUS | Status: DC
  Administered 2023-06-25: 175 ug/min via INTRAVENOUS

## 2023-06-25 MED ORDER — FAMOTIDINE 20 MG PO TABS: 20.0000 mg | ORAL_TABLET | Freq: Two times a day (BID) | ORAL | Status: DC

## 2023-06-25 MED ORDER — MORPHINE 100MG IN NS 100ML (1MG/ML) PREMIX INFUSION
0.0000 mg/h | INTRAVENOUS | Status: DC
  Administered 2023-06-25: 5 mg/h via INTRAVENOUS
  Filled 2023-06-25: qty 100

## 2023-06-25 MED ORDER — ACETAMINOPHEN 325 MG PO TABS: 650.0000 mg | ORAL_TABLET | Freq: Four times a day (QID) | ORAL | Status: DC | PRN

## 2023-06-25 MED ORDER — DEXMEDETOMIDINE HCL IN NACL 200 MCG/50ML IV SOLN: 0.0000 ug/kg/h | INTRAVENOUS | Status: DC

## 2023-06-25 MED ORDER — MAGNESIUM SULFATE 2 GM/50ML IV SOLN
2.0000 g | Freq: Once | INTRAVENOUS | Status: AC
  Administered 2023-06-25: 2 g via INTRAVENOUS
  Filled 2023-06-25: qty 50

## 2023-06-25 MED ORDER — VASOPRESSIN 20 UNITS/100 ML INFUSION FOR SHOCK
INTRAVENOUS | Status: AC
  Administered 2023-06-25: 0.04 [IU] via INTRAVENOUS
  Filled 2023-06-25: qty 100

## 2023-06-25 MED ORDER — MIDAZOLAM HCL 2 MG/2ML IJ SOLN
2.0000 mg | INTRAMUSCULAR | Status: DC | PRN
  Administered 2023-06-25: 2 mg via INTRAVENOUS
  Filled 2023-06-25: qty 4

## 2023-06-25 MED ORDER — SODIUM CHLORIDE (PF) 0.9 % IJ SOLN
INTRAMUSCULAR | Status: AC
  Filled 2023-06-25: qty 50

## 2023-06-25 MED ORDER — EPINEPHRINE HCL 5 MG/250ML IV SOLN IN NS
INTRAVENOUS | Status: AC
  Administered 2023-06-25: 15 ug/min via INTRAVENOUS
  Filled 2023-06-25: qty 250

## 2023-06-25 MED ORDER — MORPHINE BOLUS VIA INFUSION
5.0000 mg | INTRAVENOUS | Status: DC | PRN
  Administered 2023-06-25 (×2): 5 mg via INTRAVENOUS

## 2023-06-25 MED ORDER — NALOXONE HCL 0.4 MG/ML IJ SOLN: 0.4000 mg | INTRAMUSCULAR | Status: DC | PRN

## 2023-06-25 MED ORDER — POLYVINYL ALCOHOL 1.4 % OP SOLN: 1.0000 [drp] | Freq: Four times a day (QID) | OPHTHALMIC | Status: DC | PRN

## 2023-06-25 MED ORDER — INSULIN ASPART 100 UNIT/ML IJ SOLN: 0.0000 [IU] | INTRAMUSCULAR | Status: DC

## 2023-06-25 MED ORDER — DOCUSATE SODIUM 50 MG/5ML PO LIQD: 100.0000 mg | Freq: Two times a day (BID) | ORAL | Status: DC | PRN

## 2023-06-25 MED ORDER — PHENYLEPHRINE HCL-NACL 20-0.9 MG/250ML-% IV SOLN
0.0000 ug/min | INTRAVENOUS | Status: DC
  Administered 2023-06-25: 200 ug/min via INTRAVENOUS
  Administered 2023-06-25: 220 ug/min via INTRAVENOUS
  Administered 2023-06-25: 140 ug/min via INTRAVENOUS
  Administered 2023-06-25: 220 ug/min via INTRAVENOUS
  Administered 2023-06-25: 210 ug/min via INTRAVENOUS
  Filled 2023-06-25 (×5): qty 250

## 2023-06-25 MED ORDER — ALBUMIN HUMAN 5 % IV SOLN
25.0000 g | Freq: Once | INTRAVENOUS | Status: AC
  Administered 2023-06-25: 25 g via INTRAVENOUS
  Filled 2023-06-25: qty 500

## 2023-06-25 MED ORDER — FENTANYL CITRATE PF 50 MCG/ML IJ SOSY: 50.0000 ug | PREFILLED_SYRINGE | INTRAMUSCULAR | Status: DC | PRN

## 2023-06-25 MED FILL — Medication: Qty: 1 | Status: AC

## 2023-06-29 LAB — BLOOD GAS, VENOUS
Acid-base deficit: 5.3 mmol/L — ABNORMAL HIGH (ref 0.0–2.0)
Bicarbonate: 26.1 mmol/L (ref 20.0–28.0)
O2 Saturation: 38.4 %
Patient temperature: 37
pCO2, Ven: 82 mmHg (ref 44–60)
pH, Ven: 7.11 — CL (ref 7.25–7.43)
pO2, Ven: <31 mmHg — CL (ref 32–45)

## 2023-07-24 NOTE — Progress Notes (Signed)
Notified Lab that ABG being sent for analysis. 

## 2023-07-24 NOTE — Procedures (Signed)
 Central Venous Catheter Insertion Procedure Note  Carlos Dawson  [id number]  1940-06-12  Date:07/12/2023  Time:3:50 AM   Provider Performing:Brittne Kawasaki Mason Sole   Procedure: Insertion of Non-tunneled Central Venous [id number]) with US  guidance (62952)   Indication(s) Medication administration  Consent Verbal from family  Anesthesia Topical only with 1% lidocaine    Timeout Verified patient identification, verified procedure, site/side was marked, verified correct patient position, special equipment/implants available, medications/allergies/relevant history reviewed, required imaging and test results available.  Sterile Technique Maximal sterile technique including full sterile barrier drape, hand hygiene, sterile gown, sterile gloves, mask, hair covering, sterile ultrasound probe cover (if used).  Procedure Description Area of catheter insertion was cleaned with chlorhexidine  and draped in sterile fashion.  With real-time ultrasound guidance a central venous catheter was placed into the left internal jugular vein. Nonpulsatile blood flow and easy flushing noted in all ports.  The catheter was sutured in place and sterile dressing applied.  Complications/Tolerance None; patient tolerated the procedure well. Chest X-ray is ordered to verify placement for internal jugular or subclavian cannulation.   Chest x-ray is not ordered for femoral cannulation.  EBL Minimal  Specimen(s) None

## 2023-07-24 NOTE — Progress Notes (Signed)
 PHARMACY - ANTICOAGULATION CONSULT NOTE  Pharmacy Consult for Heparin  Indication: r/o Pulmonary Embolism  Allergies  Allergen Reactions   Lotensin [Benazepril] Cough   Tofranil [Imipramine]     Weight gain   Ace Inhibitors Cough   Adhesive [Tape] Itching and Rash   Amoxicillin -Pot Clavulanate Other (See Comments)    GI Upset (intolerance)   Codeine Nausea Only   Latex Itching, Rash and Other (See Comments)    Bandaids   Metformin  Diarrhea   Morphine Itching   Nifedipine Swelling     leg edema   Quinolones Other (See Comments)    Patient was warned about not using Cipro and similar antibiotics. Recent studies have raised concern that fluoroquinolone antibiotics could be associated with an increased risk of aortic aneurysm Fluoroquinolones have non-antimicrobial properties that might jeopardise the integrity of the extracellular matrix of the vascular wall In a  propensity score matched cohort study in [place], there was a 66% increased rate of aortic aneurysm or dissection associated with oral fluoroquinolone use, compared wit    Patient Measurements: Height: 5' 5.5" (166.4 cm) Weight: 107.3 kg (236 lb 8 oz) IBW/kg (Calculated) : 62.65 HEPARIN  DW (KG): 89.2  Vital Signs: Temp: 97.7 F (36.5 C) (06/01 2342) Temp Source: Oral (06/01 1948) BP: 72/39 (06/02 0310) Pulse Rate: 97 (06/02 0310)  Labs: Recent Labs    06/24/23 0424 07/13/2023 0113 07/05/2023 0235  HGB 9.4* 10.0* 8.1*  HCT 31.0* 32.4* 25.1*  PLT 179 243 189  CREATININE 0.94 1.12 1.54*  TROPONINIHS  --   --  43*    Estimated Creatinine Clearance: 42.8 mL/min (A) (by C-G formula based on SCr of 1.54 mg/dL (H)).   Medical History: Past Medical History:  Diagnosis Date   Anticoagulant long-term use    Aortic root enlargement (HCC)    Ascending aortic aneurysm (HCC)    recent scan in October 2012 showing no change; followed by Dr. Nicanor Barge   ASCVD (arteriosclerotic cardiovascular disease)    Prior BMS to  the 2nd OM in September 2012; with repeat cath in October showing patency   CAD (coronary artery disease)    a. s/p BMS to 2nd OM in Sept 2012; b. LexiScan  Myoview  (12/2012):  Inf infarct; bowel and motion artifact make study difficult to interpret; no ischemia; not gated; Low Risk   CHF (congestive heart failure) (HCC)    no recent issues 10/13/14   Chronic back pain    "all over my back" (05/11/2017)   Colonic polyp    Contact lens/glasses fitting    COPD (chronic obstructive pulmonary disease) (HCC)    Diastolic dysfunction    DVT (deep venous thrombosis) (HCC)    ?LLE   GERD (gastroesophageal reflux disease)    Hearing loss    Hearing loss    more so on left   Hemorrhoids    History of stomach ulcers    Hypertension    IBS (irritable bowel syndrome)    LVH (left ventricular hypertrophy)    OA (osteoarthritis)    "all over" (05/11/2017)   Obesities, morbid (HCC)    OSA (obstructive sleep apnea)    PSG 03/30/97 AHI 21, BPAP 13/9   OSA on CPAP    PAF (paroxysmal atrial fibrillation) (HCC)    a. on Xarelto  b. s/p DCCV in 08/2016; b. Tikosyn  failed 04/16/17 with plans for Multaq  and possible Afib ablation with Dr. Nunzio Belch   Pneumonia    'several times" (05/11/2017)   Presence of Watchman left atrial appendage closure  device 06/15/2022   24mm Watchman FLX Pro placed by Dr. Marven Slimmer   Prostate CA [facility])    Oncologist  DR. Cristal [facility] dx 09/24/14, undetermined tx   prostate; S/P "radiation and hormone injections"   Pulmonary embolism (HCC) 2008   "both lungs"   SOB (shortness of breath)    on excertion   Thoracic aortic aneurysm (HCC)    Aortic Size Index=     5.0    /Body surface area is 2.43 meters squared. = 2.05  < 2.75 cm/m2      4% risk per year 2.75 to 4.25          8% risk per year > 4.25 cm/m2    20% risk per year   Stable aneurysmal dilation of the ascending aorta with maximum AP diameter of 4.8 cm. Stable area of narrowing of the proximal most portion of the descending  aorta measuring 2 cm., previously identified as an area of coarctation. No evidence of aortic dissection.  Coronary artery disease.  Normal appearance of the lungs.   Electronically Signed   By: Dobrinka  Dimitrova M.D.   On: 10/01/2014 08:50     Type II diabetes mellitus (HCC)    metphormin, average 154 dx 2017    Medications:  PTA Xarelto  10mg  daily - LD 6/1 @ 0941  Assessment: 83 yr male admitted 06/11/23 for left THA and post-op developed AFib with RVR and Ileus.  Patient has been on Xarelto  10mg  daily for VTE prophylaxis.  Tonight patient had code blue. CTAngio ordered to r/o Pulmonary Embolism DOACs can falsely elevate heparin  level.  Will monitor heparin  therapy with aPTT until effects of recent DOAC use wears off and aPTT and heparin  levels correlate  Goal of Therapy:  Heparin  level 0.3-0.7 units/ml aPTT 66-102 seconds Monitor platelets by anticoagulation protocol: Yes   Plan:  Heparin  3000 unit IV bolus x 1 Heparin  gtt @ 1450 units/hr Check aPTT and heparin  level 8 hr after heparin  started Daily heparin  level & CBC F/u CTAngio  Danitza Schoenfeldt, Gabriel John, PharmD 07/06/2023,4:20 AM

## 2023-07-24 NOTE — Progress Notes (Addendum)
 PHARMACY - ANTICOAGULATION CONSULT NOTE  Pharmacy Consult for Heparin  Indication: Afib  Allergies  Allergen Reactions   Lotensin [Benazepril] Cough   Tofranil [Imipramine]     Weight gain   Ace Inhibitors Cough   Adhesive [Tape] Itching and Rash   Amoxicillin -Pot Clavulanate Other (See Comments)    GI Upset (intolerance)   Codeine Nausea Only   Latex Itching, Rash and Other (See Comments)    Bandaids   Metformin  Diarrhea   Morphine Itching   Nifedipine Swelling     leg edema   Quinolones Other (See Comments)    Patient was warned about not using Cipro and similar antibiotics. Recent studies have raised concern that fluoroquinolone antibiotics could be associated with an increased risk of aortic aneurysm Fluoroquinolones have non-antimicrobial properties that might jeopardise the integrity of the extracellular matrix of the vascular wall In a  propensity score matched cohort study in [place], there was a 66% increased rate of aortic aneurysm or dissection associated with oral fluoroquinolone use, compared wit    Patient Measurements: Height: 5' 5.5" (166.4 cm) Weight: 107.3 kg (236 lb 8 oz) IBW/kg (Calculated) : 62.65 HEPARIN  DW (KG): 89.2  Vital Signs: Temp: 100.6 F (38.1 C) (06/02 0817) Temp Source: Bladder (06/02 0817) BP: 116/42 (06/02 0817) Pulse Rate: 105 (06/02 0817)  Labs: Recent Labs    07/04/2023 0113 07/15/2023 0235 06/28/2023 0527 07/12/2023 0721 07/14/2023 0752  HGB 10.0* 8.1* 8.8*  --   --   HCT 32.4* 25.1* 29.7*  --   --   PLT 243 189 221  --   --   LABPROT  --   --   --   --  24.2*  INR  --   --   --   --  2.1*  CREATININE 1.12 1.54* 1.53*  --   --   TROPONINIHS  --  43* 83* 113*  --     Estimated Creatinine Clearance: 43.1 mL/min (A) (by C-G formula based on SCr of 1.53 mg/dL (H)).   Medical History: Past Medical History:  Diagnosis Date   Anticoagulant long-term use    Aortic root enlargement (HCC)    Ascending aortic aneurysm (HCC)    recent  scan in October 2012 showing no change; followed by Dr. Nicanor Barge   ASCVD (arteriosclerotic cardiovascular disease)    Prior BMS to the 2nd OM in September 2012; with repeat cath in October showing patency   CAD (coronary artery disease)    a. s/p BMS to 2nd OM in Sept 2012; b. LexiScan  Myoview  (12/2012):  Inf infarct; bowel and motion artifact make study difficult to interpret; no ischemia; not gated; Low Risk   CHF (congestive heart failure) (HCC)    no recent issues 10/13/14   Chronic back pain    "all over my back" (05/11/2017)   Colonic polyp    Contact lens/glasses fitting    COPD (chronic obstructive pulmonary disease) (HCC)    Diastolic dysfunction    DVT (deep venous thrombosis) (HCC)    ?LLE   GERD (gastroesophageal reflux disease)    Hearing loss    Hearing loss    more so on left   Hemorrhoids    History of stomach ulcers    Hypertension    IBS (irritable bowel syndrome)    LVH (left ventricular hypertrophy)    OA (osteoarthritis)    "all over" (05/11/2017)   Obesities, morbid (HCC)    OSA (obstructive sleep apnea)    PSG 03/30/97 AHI 21, BPAP  13/9   OSA on CPAP    PAF (paroxysmal atrial fibrillation) (HCC)    a. on Xarelto  b. s/p DCCV in 08/2016; b. Tikosyn  failed 04/16/17 with plans for Multaq  and possible Afib ablation with Dr. Nunzio Belch   Pneumonia    'several times" (05/11/2017)   Presence of Watchman left atrial appendage closure device 06/15/2022   24mm Watchman FLX Pro placed by Dr. Marven Slimmer   Prostate CA [facility])    Oncologist  DR. Cristal [facility] dx 09/24/14, undetermined tx   prostate; S/P "radiation and hormone injections"   Pulmonary embolism (HCC) 2008   "both lungs"   SOB (shortness of breath)    on excertion   Thoracic aortic aneurysm (HCC)    Aortic Size Index=     5.0    /Body surface area is 2.43 meters squared. = 2.05  < 2.75 cm/m2      4% risk per year 2.75 to 4.25          8% risk per year > 4.25 cm/m2    20% risk per year   Stable aneurysmal dilation of  the ascending aorta with maximum AP diameter of 4.8 cm. Stable area of narrowing of the proximal most portion of the descending aorta measuring 2 cm., previously identified as an area of coarctation. No evidence of aortic dissection.  Coronary artery disease.  Normal appearance of the lungs.   Electronically Signed   By: Dobrinka  Dimitrova M.D.   On: 10/01/2014 08:50     Type II diabetes mellitus (HCC)    metphormin, average 154 dx 2017    Medications: Infusions:   sodium chloride  20 mL/hr at 07/04/2023 1004   sodium chloride      amiodarone  30 mg/hr (07/01/2023 1004)   dexmedetomidine (PRECEDEX) IV infusion Stopped (07/04/2023 0612)   epinephrine  18 mcg/min (06/30/2023 1004)   fentaNYL  infusion INTRAVENOUS Stopped (07/07/2023 0612)   heparin  1,450 Units/hr (06/27/2023 1005)   magnesium  sulfate bolus IVPB     norepinephrine (LEVOPHED) Adult infusion 2 mcg/min (07/05/2023 1004)   phenylephrine  (NEO-SYNEPHRINE) Adult infusion 220 mcg/min (07/05/2023 1004)   piperacillin-tazobactam (ZOSYN)  IV 12.5 mL/hr at 07/11/2023 1004   vasopressin 0.04 Units/min (07/12/2023 1004)     Assessment: 83 yr male admitted 06/11/23 for left THA and post-op developed AFib with RVR and Ileus.  Patient was on Xarelto  10mg  daily, then Lovenox  while NPO, and transitioned back to Xarelto  on 5/31-6/1.      6/2 Patient had code blue event - desaturation and became unresponsive and bradycardic and eventually went into PEA arrest with ROSC after approximately 30 minutes.  Pharmacy is consulted to dose Heparin  IV for rule out PE and AFib.   PE less likely, continue heparin  for Afib.  Today, 07/20/2023: CBC: Hgb 8.8, Plt 221 Will use aPTT for dosing/titration of heparin  therapy until effects of recent DOAC use wears off and aPTT and heparin  levels correlate.  Goal of Therapy:  Heparin  level 0.3-0.7 units/ml aPTT 66-102 seconds Monitor platelets by anticoagulation protocol: Yes   Plan:  Heparin  paused on 6/2 at 9am prior to CT scan for  potential perforation.  Resume at 10:00am post imaging.  Heparin  gtt @ 1450 units/hr Check aPTT and heparin  level 8 hr after heparin  started Daily heparin  level & CBC   Kendall Pauls PharmD, BCPS [facility] main pharmacy [phone] 07/18/2023 10:10 AM

## 2023-07-24 NOTE — H&P (Signed)
 NAME:  Carlos Dawson, MRN:  [id number], DOB:  1940-07-15, LOS: 11 ADMISSION DATE:  06/11/2023, CONSULTATION DATE:  07/23/2023 REFERRING MD:  Bailey Lesser, PA, CHIEF COMPLAINT:  Post cardiac arrest   History of Present Illness:  83 y/o male with PMH for PAF s/p Watchman 05/2022 (secondary to Hematuria), CAD s/p PCI 2012, Chronic diastolic heart failure, PE/DVT, OSA, DMT2, Prostate cancer s/p radiation, HLD, HFpEF, Thoracic Aortic Aneurysm who underwent a left total hip arthroplasty 5/19 and post operative care complicated by A fib with RVR, Hypotension, Initially on Amiodarone  then Metoprolol , then had Ileus, TPN started via PICC line which then malfunctioned so TPN stopped and his Ileus was imprved then worsened and NGT placed. Tonight he had a code blue.  Seems like he was hypoxic and VBG showing Hypercapnia, then was in A fib with RVR and brought to ICU and then had bradycardia to cardiac arrest witrh intermittent PEA.  He had about 30 min 9of CPR and multiple rounds of Epinephrine  and then placed on Epinephrine  drip and Amiodarone  drip.  Family at bedside when I saw him.  Pertinent  Medical History   Anticoagulant long-term use, Aortic root enlargement (HCC), Ascending aortic aneurysm (HCC), ASCVD (arteriosclerotic cardiovascular disease), CAD (coronary artery disease), CHF (congestive heart failure) (HCC), Chronic back pain, Colonic polyp, Contact lens/glasses fitting, COPD (chronic obstructive pulmonary disease) (HCC), Diastolic dysfunction, DVT (deep venous thrombosis) (HCC), GERD (gastroesophageal reflux disease), Hearing loss, Hearing loss, Hemorrhoids, History of stomach ulcers, Hypertension, IBS (irritable bowel syndrome), LVH (left ventricular hypertrophy), OA (osteoarthritis), Obesities, morbid (HCC), OSA (obstructive sleep apnea), OSA on CPAP, PAF (paroxysmal atrial fibrillation) (HCC), Pneumonia, Presence of Watchman left atrial appendage closure device (06/15/2022), Prostate CA (HCC),  Pulmonary embolism (HCC) (2008), SOB (shortness of breath), Thoracic aortic aneurysm (HCC), and Type II diabetes mellitus (HCC).    Significant Hospital Events: Including procedures, antibiotic start and stop dates in addition to other pertinent events   6/2: code blue transfer to ICU  Interim History / Subjective:  N/a  Objective    Blood pressure (!) 145/123, pulse 64, temperature 97.7 F (36.5 C), resp. rate (!) 38, height 5' 5.5" (1.664 m), weight 107.3 kg, SpO2 (!) 10%.    Vent Mode: PRVC FiO2 (%):  [100 %] 100 % Vt Set:  [540 mL] 540 mL PEEP:  [14 cmH20] 14 cmH20 Plateau Pressure:  [16 cmH20] 16 cmH20   Intake/Output Summary (Last 24 hours) at 06/29/2023 0314 Last data filed at 06/26/2023 0014 Gross per 24 hour  Intake 623.97 ml  Output 950 ml  Net -326.03 ml   Filed Weights   06/22/23 0349 06/23/23 0400 06/24/23 5284  Weight: 106.7 kg 104.4 kg 107.3 kg    Examination: General: sedated and post code and intubated HENT: Pupils equal not reactive and no icterus Lungs: CTA no wheezes no rales Cardiovascular: irreg irreg, s1s2 no murmurs Abdomen: obese nt nd bs very sluggish at best Extremities: No cyanosis, clubbing positive edema b/l 2+ pitting Neuro: sedated and intubated  Resolved problem list   Assessment and Plan  Code Blue/Cardiac Arrest EKG no ST elevations Possible cause Pulmonary embolism, not ruled out this admission On Epinephrine  drip 15mcg and Vasopressin drip 0,03 units Currently on Amiodarone  Remains hypotensive despite drips CTA pending, may start Heparin  drip empirically and will d/w family prior to starting Shock-post cardiac arrest Continue with IV Vasopressors Hypercapnic and hypoxic respiratory failure Intubated during code VAP prevention SAT/SBT per protocols Follow serial ABGs for vent management Atrial Fib with  RVR On amiodarone , rates now controlled Ileus NGT to suction TPN can resume via central line S/p Left Total Hip  Arthroplasty Stable, per Ortho H/o CAD Trend troponins post arrest H/o CHF Cardiology has been following Judicious use of IV fluids  Best Practice (right click and "Reselect all SmartList Selections" daily)   Diet/type: TPN DVT prophylaxis systemic heparin  Pressure ulcer(s): N/A GI prophylaxis: H2B Lines: Central line Foley:  Yes, and it is still needed Code Status:  full code   Labs   CBC: Recent Labs  Lab 06/21/23 0441 06/22/23 0643 06/23/23 0425 06/24/23 0424 07/11/2023 0113  WBC 5.6 9.5 10.2 11.9* 28.9*  HGB 8.9* 9.2* 8.6* 9.4* 10.0*  HCT 28.1* 27.4* 27.5* 31.0* 32.4*  MCV 109.3* 112.3* 110.4* 113.1* 117.8*  PLT 208 204 182 179 243    Basic Metabolic Panel: Recent Labs  Lab 06/19/23 0405 06/20/23 0454 06/21/23 0441 06/22/23 0643 06/23/23 0425 06/24/23 0424 07/08/2023 0113  NA 133* 137 137 144 141 141 139  K 3.1* 3.1* 2.8* 3.1* 3.2* 3.6 4.3  CL 101 103 105 112* 112* 111 110  CO2 24 25 25 25 22 23 26   GLUCOSE 131* 182* 209* 194* 218* 142* 177*  BUN 44* 52* 51* 38* 33* 35* 36*  CREATININE 1.22 1.26* 1.19 1.08 0.85 0.94 1.12  CALCIUM  8.0* 8.5* 8.1* 8.6* 8.3* 8.8* 8.4*  MG 2.1 2.4 2.4 2.1 2.0 2.0  --   PHOS 3.2 2.7 2.8 2.8  --  3.2  --    GFR: Estimated Creatinine Clearance: 58.9 mL/min (by C-G formula based on SCr of 1.12 mg/dL). Recent Labs  Lab 06/22/23 0643 06/23/23 0425 06/24/23 0424 07/09/2023 0113  WBC 9.5 10.2 11.9* 28.9*    Liver Function Tests: Recent Labs  Lab 06/19/23 0405 06/20/23 0454 06/21/23 0441 06/23/23 0425 07/06/2023 0113  AST 17 15 14* 15 19  ALT 14 13 12 11 16   ALKPHOS 68 75 70 69 93  BILITOT 1.6* 1.5* 1.5* 1.3* 1.6*  PROT 5.4* 5.6* 5.7* 5.6* 5.9*  ALBUMIN  2.7* 3.0* 2.8* 2.9* 3.1*   No results for input(s): "LIPASE", "AMYLASE" in the last 168 hours. Recent Labs  Lab 06/19/23 1643  AMMONIA 51*    ABG    Component Value Date/Time   PHART 7.336 (L) 05/07/2014 0923   PCO2ART 42.3 05/07/2014 0923   PO2ART 66.0 (L)  05/07/2014 0923   HCO3 26.1 06/28/2023 0111   TCO2 24 05/07/2014 0923   ACIDBASEDEF 5.3 (H) 07/02/2023 0111   O2SAT 38.4 07/20/2023 0111     Coagulation Profile: No results for input(s): "INR", "PROTIME" in the last 168 hours.  Cardiac Enzymes: No results for input(s): "CKTOTAL", "CKMB", "CKMBINDEX", "TROPONINI" in the last 168 hours.  HbA1C: Hgb A1c MFr Bld  Date/Time Value Ref Range Status  05/29/2023 08:24 AM 5.7 (H) 4.8 - 5.6 % Final    Comment:    (NOTE) Pre diabetes:          5.7%-6.4%  Diabetes:              >6.4%  Glycemic control for   <7.0% adults with diabetes   10/15/2018 02:07 PM 6.7 (H) 4.8 - 5.6 % Final    Comment:    (NOTE)         Prediabetes: 5.7 - 6.4         Diabetes: >6.4         Glycemic control for adults with diabetes: <7.0     CBG: Recent Labs  Lab 06/24/23 0750 06/24/23 1140 06/24/23 1635 06/24/23 1937 06/24/23 2337  GLUCAP 138* 122* 143* 152* 149*    Review of Systems:   Unable to obtain, patient Intubated and sedated  Past Medical History:  He,  has a past medical history of Anticoagulant long-term use, Aortic root enlargement (HCC), Ascending aortic aneurysm (HCC), ASCVD (arteriosclerotic cardiovascular disease), CAD (coronary artery disease), CHF (congestive heart failure) (HCC), Chronic back pain, Colonic polyp, Contact lens/glasses fitting, COPD (chronic obstructive pulmonary disease) (HCC), Diastolic dysfunction, DVT (deep venous thrombosis) (HCC), GERD (gastroesophageal reflux disease), Hearing loss, Hearing loss, Hemorrhoids, History of stomach ulcers, Hypertension, IBS (irritable bowel syndrome), LVH (left ventricular hypertrophy), OA (osteoarthritis), Obesities, morbid (HCC), OSA (obstructive sleep apnea), OSA on CPAP, PAF (paroxysmal atrial fibrillation) (HCC), Pneumonia, Presence of Watchman left atrial appendage closure device (06/15/2022), Prostate CA (HCC), Pulmonary embolism (HCC) (2008), SOB (shortness of breath), Thoracic  aortic aneurysm (HCC), and Type II diabetes mellitus (HCC).   Surgical History:   Past Surgical History:  Procedure Laterality Date   ACHILLES TENDON REPAIR Bilateral    AORTIC ARCH ANGIOGRAPHY N/A 03/13/2017   Procedure: AORTIC ARCH ANGIOGRAPHY;  Surgeon: [name], [name], MD;  Location: [facility] INVASIVE CV LAB;  Service: Cardiovascular;  Laterality: N/A;   APPENDECTOMY     ATRIAL FIBRILLATION ABLATION  05/11/2017   ATRIAL FIBRILLATION ABLATION N/A 05/11/2017   Procedure: ATRIAL FIBRILLATION ABLATION;  Surgeon: Jolly Needle, MD;  Location: [facility] INVASIVE CV LAB;  Service: Cardiovascular;  Laterality: N/A;   BACK SURGERY     "I've had 7 back and 1 neck ORs" (05/11/2017)   BIOPSY  03/14/2018   Procedure: BIOPSY;  Surgeon: Genell Ken, MD;  Location: [facility] ENDOSCOPY;  Service: Gastroenterology;;  EGD and Colon   CARDIAC CATHETERIZATION  2006   CARPAL TUNNEL RELEASE Bilateral    LEFT   CATARACT EXTRACTION W/ INTRAOCULAR LENS  IMPLANT, BILATERAL Bilateral    CERVICAL SPINE SURGERY  06/02/2010   lower back and neck   COLONOSCOPY N/A 03/14/2018   Procedure: COLONOSCOPY;  Surgeon: Genell Ken, MD;  Location: [facility] ENDOSCOPY;  Service: Gastroenterology;  Laterality: N/A;   COLONOSCOPY WITH PROPOFOL  N/A 12/29/2014   Procedure: COLONOSCOPY WITH PROPOFOL ;  Surgeon: Garrett Kallman, MD;  Location: [facility] ENDOSCOPY;  Service: Endoscopy;  Laterality: N/A;   CORONARY ANGIOPLASTY WITH STENT PLACEMENT  October 2012   CORONARY STENT PLACEMENT  Sept 2012   2nd OM with BMS   ESOPHAGOGASTRODUODENOSCOPY N/A 03/14/2018   Procedure: ESOPHAGOGASTRODUODENOSCOPY (EGD);  Surgeon: Genell Ken, MD;  Location: [facility];  Service: Gastroenterology;  Laterality: N/A;   HEMORROIDECTOMY     LAMINECTOMY  05/30/2012   L 4 L5   LAMINECTOMY WITH POSTERIOR LATERAL ARTHRODESIS LEVEL 3 N/A 10/18/2016   Procedure: Posterior Lateral Fusion - Lumbar One-Four, segmental instrumentation Lumbar One-Five,  decompression,;  Surgeon: Isadora Mar, MD;   Location: [facility] OR;  Service: Neurosurgery;  Laterality: N/A;   LAPAROSCOPIC CHOLECYSTECTOMY     LAPAROSCOPIC GASTRIC BANDING     LEFT AND RIGHT HEART CATHETERIZATION WITH CORONARY ANGIOGRAM N/A 05/07/2014   Procedure: LEFT AND RIGHT HEART CATHETERIZATION WITH CORONARY ANGIOGRAM;  Surgeon: [name] [name], MD;  Location: [facility] CATH LAB;  Service: Cardiovascular;  Laterality: N/A;   LEFT ATRIAL APPENDAGE OCCLUSION N/A 06/15/2022   Procedure: LEFT ATRIAL APPENDAGE OCCLUSION;  Surgeon: Boyce Byes, MD;  Location: [facility] INVASIVE CV LAB;  Service: Cardiovascular;  Laterality: N/A;   LEFT HEART CATH AND CORONARY ANGIOGRAPHY N/A 03/13/2017   Procedure: LEFT HEART CATH  AND CORONARY ANGIOGRAPHY;  Surgeon: [name], [name], MD;  Location: [facility] INVASIVE CV LAB;  Service: Cardiovascular;  Laterality: N/A;   LUMBAR LAMINECTOMY/DECOMPRESSION MICRODISCECTOMY N/A 05/04/2016   Procedure: Laminectomy and Foraminotomy - Thoracic twelve-Lumbar one -Posterior Fusion Lumbar one-two;  Surgeon: Isadora Mar, MD;  Location: [facility] OR;  Service: Neurosurgery;  Laterality: N/A;   POLYPECTOMY  03/14/2018   Procedure: POLYPECTOMY;  Surgeon: Genell Ken, MD;  Location: [facility] ENDOSCOPY;  Service: Gastroenterology;;   POSTERIOR LUMBAR FUSION  10/18/2016   SHOULDER OPEN ROTATOR CUFF REPAIR Bilateral    TEE WITHOUT CARDIOVERSION N/A 06/15/2022   Procedure: TRANSESOPHAGEAL ECHOCARDIOGRAM;  Surgeon: Boyce Byes, MD;  Location: [facility] INVASIVE CV LAB;  Service: Cardiovascular;  Laterality: N/A;   TONSILLECTOMY AND ADENOIDECTOMY     TOTAL HIP ARTHROPLASTY Right 10/18/2018   Procedure: RIGHT TOTAL HIP ARTHROPLASTY ANTERIOR APPROACH;  Surgeon: Arnie Lao, MD;  Location: [facility] ORS;  Service: Orthopedics;  Laterality: Right;   TOTAL HIP ARTHROPLASTY Left 06/11/2023   Procedure: ARTHROPLASTY, HIP, TOTAL, ANTERIOR APPROACH;  Surgeon: Liliane Rei, MD;  Location: [facility] ORS;  Service: Orthopedics;  Laterality: Left;   TRIGGER FINGER RELEASE      LEFT   UVULOPALATOPHARYNGOPLASTY     VASECTOMY       Social History:   reports that he quit smoking about 31 years ago. His smoking use included cigarettes. He started smoking about 67 years ago. He has a 54 pack-year smoking history. He has never used smokeless tobacco. He reports that he does not drink alcohol and does not use drugs.   Family History:  His family history includes Diabetes in his mother; Emphysema (age of onset: 70) in his father; Heart attack in his sister; Heart disease in his mother; Other in his mother.   Allergies Allergies  Allergen Reactions   Lotensin [Benazepril] Cough   Tofranil [Imipramine]     Weight gain   Ace Inhibitors Cough   Adhesive [Tape] Itching and Rash   Amoxicillin -Pot Clavulanate Other (See Comments)    GI Upset (intolerance)   Codeine Nausea Only   Latex Itching, Rash and Other (See Comments)    Bandaids   Metformin  Diarrhea   Morphine Itching   Nifedipine Swelling     leg edema   Quinolones Other (See Comments)    Patient was warned about not using Cipro and similar antibiotics. Recent studies have raised concern that fluoroquinolone antibiotics could be associated with an increased risk of aortic aneurysm Fluoroquinolones have non-antimicrobial properties that might jeopardise the integrity of the extracellular matrix of the vascular wall In a  propensity score matched cohort study in [place], there was a 66% increased rate of aortic aneurysm or dissection associated with oral fluoroquinolone use, compared wit     Home Medications  Prior to Admission medications   Medication Sig Start Date End Date Taking? Authorizing Provider  acetaminophen  (TYLENOL ) 500 MG tablet Take 1,000 mg by mouth every 8 (eight) hours as needed for mild pain or headache.    Yes [provider]  albuterol  (VENTOLIN  HFA) 108 (90 Base) MCG/ACT inhaler Inhale 2 puffs into the lungs every 4 (four) hours as needed for wheezing or shortness of breath.  10/30/22  Yes Parrett, Tammy S, NP  budesonide -formoterol  (SYMBICORT ) 160-4.5 MCG/ACT inhaler Inhale 2 puffs into the lungs daily as needed (Asthma). Patient taking differently: Inhale 2 puffs into the lungs 2 (two) times daily as needed (Asthma). 10/30/22  Yes Parrett, Tammy S, NP  celecoxib  (CELEBREX ) 200 MG  capsule Take 200 mg by mouth daily as needed for moderate pain (pain score 4-6). 02/23/23  Yes [provider]  cetirizine (ZYRTEC) 10 MG tablet Take 10 mg by mouth daily.   Yes [provider]  Cholecalciferol  (VITAMIN D3 MAXIMUM STRENGTH) 125 MCG (5000 UT) capsule Take 5,000 Units by mouth daily.   Yes [provider]  Ferrous Sulfate Dried (FEOSOL) 200 (65 Fe) MG TABS Take 1 tablet by mouth every other day.   Yes [provider]  furosemide  (LASIX ) 40 MG tablet TAKE ONE TABLET (40MG ) BY MOUTH EACH DAY AS NEEDED FOR SWELLING OR WEIGHT GAIN. Patient taking differently: Take 40 mg by mouth daily. May take a second 40 mg dose as needed for swelling 03/20/22  Yes [name], [name], MD  JARDIANCE  25 MG TABS tablet Take 25 mg by mouth daily. 04/18/18  Yes [provider]  losartan  (COZAAR ) 25 MG tablet Take 1 tablet (25 mg total) by mouth daily. 03/20/23  Yes [name], [name], MD  pantoprazole  (PROTONIX ) 40 MG tablet TAKE ONE TABLET BY MOUTH BEFORE BREAKFAST (TAKE ON AN EMPTY STOMACH 30 MINUTES PRIOR TO A MEAL) 03/09/21  Yes [provider]  potassium chloride  SA (KLOR-CON  M20) 20 MEQ tablet Taking two tablet by mouth in the am and 1 tablet in the evening 09/22/22  Yes [name], [name], MD  pregabalin (LYRICA) 50 MG capsule Take 50 mg by mouth at bedtime as needed (pain). 02/23/23  Yes [provider]  psyllium (METAMUCIL) 58.6 % packet Take 1 packet by mouth daily as needed (Looose stool).   Yes [provider]  spironolactone  (ALDACTONE ) 25 MG tablet TAKE 1 TABLET (25 MG TOTAL) BY MOUTH DAILY. 05/24/23  Yes [name], [name], MD  tamsulosin   (FLOMAX ) 0.4 MG CAPS Take 0.4 mg by mouth every evening.    Yes [provider]  Tiotropium Bromide  Monohydrate (SPIRIVA  RESPIMAT) 2.5 MCG/ACT AERS Inhale 2 puffs into the lungs daily as needed (shortness of breath).   Yes [provider]  topiramate  (TOPAMAX ) 25 MG capsule Take 50 mg by mouth 2 (two) times daily.    Yes [provider]  umeclidinium bromide  (INCRUSE ELLIPTA ) 62.5 MCG/ACT AEPB Inhale 1 puff into the lungs daily. 03/20/23  Yes Parrett, Tammy S, NP  cyclobenzaprine  (FLEXERIL ) 10 MG tablet Take 1 tablet (10 mg total) by mouth 3 (three) times daily as needed for muscle spasms. 06/13/23   Edmisten, Kristie L, PA  furosemide  (LASIX ) 80 MG tablet Take 0.5 tablets (40 mg total) by mouth daily. Patient not taking: Reported on 05/22/2023 02/27/23 05/28/23  [name], [name], MD  HYDROcodone -acetaminophen  (NORCO/VICODIN) 5-325 MG tablet Take 1-2 tablets by mouth every 6 (six) hours as needed for severe pain (pain score 7-10). 06/13/23   Edmisten, Kristie L, PA  ipratropium (ATROVENT ) 0.03 % nasal spray Place 2 sprays into both nostrils every 12 (twelve) hours. Patient not taking: Reported on 05/22/2023 03/02/23   Parrett, Macdonald Savoy, NP  nitroGLYCERIN  (NITROSTAT ) 0.4 MG SL tablet Place 1 tablet (0.4 mg total) under the tongue every 5 (five) minutes as needed for chest pain. 09/08/21   [name], [name], MD  ondansetron  (ZOFRAN ) 4 MG tablet Take 1 tablet (4 mg total) by mouth every 6 (six) hours as needed for nausea. 06/13/23   Edmisten, Onesimo Bijou, PA  Polyethyl Glycol-Propyl Glycol (SYSTANE OP) Place 1 drop into both eyes daily as needed (for dry eyes).     [provider]  rivaroxaban  (XARELTO ) 10  MG TABS tablet Take 1 tablet (10 mg total) by mouth daily with breakfast for 19 days. Then resume one 81 mg aspirin  once a day 06/14/23 07/03/23  Edmisten, Kristie L, PA  sildenafil  (VIAGRA ) 100 MG tablet Take 1 tablet (100 mg total) by mouth as needed for erectile dysfunction. 07/13/20    [name], [name], MD  Skin Protectants, Misc. (EUCERIN) cream Apply 1 application topically as needed for dry skin.    [provider]     Critical care time: 34   The patient is critically ill with multiple organ system failure and requires high complexity decision making for assessment and support, frequent evaluation and titration of therapies, advanced monitoring, review of radiographic studies and interpretation of complex data.   Critical Care Time devoted to patient care services, exclusive of separately billable procedures, described in this note is 39 minutes.   Claven Cumming,  MD [facility] See Amion for pager  If no response to pager , please call [phone] until 7pm After 7:00 pm call Elink  [phone] 07/22/2023, 3:14 AM

## 2023-07-24 NOTE — Progress Notes (Signed)
 eLink Physician-Brief Progress Note Patient Name: Carlos Dawson DOB: 04-04-1940 MRN: [id number]   Date of Service  07/15/2023  HPI/Events of Note  76 M PAF s/p Watchman (05/2022), CAD s/p PCI 2012, HFpEF, OSA, DM, dyslipidemia, prostate CA s/p radtx, TAA. Underwent hip arthroplasty 5/19 postoperative course complicated by afib RVR and ileus.  Overnight had progressive worsening of altered sensorium, afib RVR, desaturation and became unresponsive and bradycardic and eventually went into PEA arrest with ROSC after approximately 30 minutes.  eICU Interventions  CCM team at bedside Afib RVR Hypercapnic and hypoxemic respiratory failure Shock     Intervention Category Evaluation Type: New Patient Evaluation  Turner Gains 07/12/2023, 3:24 AM

## 2023-07-24 NOTE — Progress Notes (Signed)
 Progress Note  Patient Name: Carlos Dawson Date of Encounter: 07/03/2023  Primary Cardiologist: Peter [name], MD   Subjective   Patient and seen at his bedside. Family at bedside.  Inpatient Medications    Scheduled Meds:  adenosine        aspirin  EC  81 mg Oral Daily   Chlorhexidine  Gluconate Cloth  6 each Topical Daily   cholecalciferol   5,000 Units Oral Daily   EPINEPHrine        hydrocortisone sod succinate (SOLU-CORTEF) inj  50 mg Intravenous Q6H   insulin  aspart  0-15 Units Subcutaneous Q4H   pantoprazole  (PROTONIX ) IV  40 mg Intravenous QHS   sodium bicarbonate       sodium chloride  flush  10-40 mL Intracatheter Q12H   tamsulosin   0.4 mg Oral QPM   Continuous Infusions:  sodium chloride  20 mL/hr at 06/29/2023 0957   sodium chloride      amiodarone  30 mg/hr (07/16/2023 0957)   Followed by   amiodarone      dexmedetomidine (PRECEDEX) IV infusion Stopped (07/21/2023 0612)   epinephrine  18 mcg/min (07/22/2023 0957)   fentaNYL  infusion INTRAVENOUS Stopped (07/13/2023 0612)   norepinephrine (LEVOPHED) Adult infusion 2 mcg/min (06/30/2023 0957)   phenylephrine  (NEO-SYNEPHRINE) Adult infusion 220 mcg/min (07/08/2023 0957)   piperacillin-tazobactam (ZOSYN)  IV 12.5 mL/hr at 07/22/2023 0957   vasopressin 0.04 Units/min (07/17/2023 0957)   PRN Meds: Place/Maintain arterial line **AND** sodium chloride , acetaminophen , adenosine , albuterol , budesonide -formoterol , EPINEPHrine , fentaNYL  (SUBLIMAZE ) injection, HYDROmorphone  (DILAUDID ) injection, lip balm, naLOXone (NARCAN)  injection, ondansetron  **OR** ondansetron  (ZOFRAN ) IV, mouth rinse, sodium bicarbonate, sodium chloride , sodium chloride  flush, traMADol    Vital Signs    Vitals:   07/01/2023 0640 07/20/2023 0700 07/17/2023 0800 07/04/2023 0817  BP: (!) 91/48 (!) 98/54 (!) 108/52 (!) 116/42  Pulse: 98 (!) 111 71 (!) 105  Resp: 16 (!) 21 18 20   Temp:  100.2 F (37.9 C)  (!) 100.6 F (38.1 C)  TempSrc:  Bladder  Bladder  SpO2: 99% 98% 100% 98%   Weight:      Height: 5' 5.5" (1.664 m)       Intake/Output Summary (Last 24 hours) at 07/05/2023 0957 Last data filed at 07/12/2023 0957 Gross per 24 hour  Intake 3399.1 ml  Output 1000 ml  Net 2399.1 ml   Filed Weights   06/22/23 0349 06/23/23 0400 06/24/23 0627  Weight: 106.7 kg 104.4 kg 107.3 kg    Telemetry    Afib rate contr0lled  - Personally Reviewed  ECG     - Personally Reviewed  Physical Exam     General: Comfortable Head: Atraumatic, normal size  Eyes: PEERLA, EOMI  Neck: Supple, normal JVD Cardiac: Normal S1, S2; RRR; no murmurs, rubs, or gallops Lungs: Clear to auscultation bilaterally Abd: Soft, nontender, no hepatomegaly  Ext: warm, no edema Musculoskeletal: No deformities, BUE and BLE strength normal and equal Skin: Warm and dry, no rashes   Neuro: Alert and oriented to person, place, time, and situation, CNII-XII grossly intact, no focal deficits  Psych: Normal mood and affect   Labs    Chemistry Recent Labs  Lab 06/23/23 0425 06/24/23 0424 06/27/2023 0113 07/01/2023 0235 07/13/2023 0527  NA 141   < > 139 144 138  K 3.2*   < > 4.3 4.9 4.1  CL 112*   < > 110 113* 113*  CO2 22   < > 26 16* 19*  GLUCOSE 218*   < > 177* 203* 251*  BUN 33*   < >  36* 36* 40*  CREATININE 0.85   < > 1.12 1.54* 1.53*  CALCIUM  8.3*   < > 8.4* 8.7* 7.7*  PROT 5.6*  --  5.9* 4.5*  --   ALBUMIN  2.9*  --  3.1* 2.3*  --   AST 15  --  19 81*  --   ALT 11  --  16 27  --   ALKPHOS 69  --  93 110  --   BILITOT 1.3*  --  1.6* 1.2  --   GFRNONAA >60   < > >60 45* 45*  ANIONGAP 7   < > 3* 15 6   < > = values in this interval not displayed.     Hematology Recent Labs  Lab 07/09/2023 0113 07/02/2023 0235 07/22/2023 0527  WBC 28.9* 32.1* 29.8*  RBC 2.75* 2.09* 2.57*  HGB 10.0* 8.1* 8.8*  HCT 32.4* 25.1* 29.7*  MCV 117.8* 120.1* 115.6*  MCH 36.4* 38.8* 34.2*  MCHC 30.9 32.3 29.6*  RDW 17.7* 17.6* 17.3*  PLT 243 189 221    Cardiac EnzymesNo results for input(s):  "TROPONINI" in the last 168 hours. No results for input(s): "TROPIPOC" in the last 168 hours.   BNP Recent Labs  Lab 06/19/23 1742 07/01/2023 0113  BNP 300.9* 491.9*     DDimer  Recent Labs  Lab 07/15/2023 0113  DDIMER 1.57*     Radiology    DG Abd 2 Views Result Date: 07/03/2023 CLINICAL DATA:  Evaluation of pneumoperitoneum EXAM: ABDOMEN - 2 VIEW COMPARISON:  Abdominal radiograph dated 06/24/2023 FINDINGS: Inferior approach urinary catheter tip projects over the midline lower pelvis. Diffuse gas-filled dilation of small and large bowel loops throughout the abdomen. No definite supine finding of free air or pneumatosis. No abnormal radio-opaque calculi or mass effect. No acute or substantial osseous abnormality. The sacrum and coccyx are partially obscured by overlying bowel contents. Thoracolumbar spinal fusion hardware appears intact. Bilateral hip arthroplasties, partially imaged on the right, without hardware complications. IMPRESSION: Diffuse gas-filled dilation of small and large bowel loops throughout the abdomen, likely ileus. No definite supine finding of free air or pneumatosis. Electronically Signed   By: Limin  Xu M.D.   On: 06/26/2023 08:44   DG CHEST PORT 1 VIEW Result Date: 07/12/2023 CLINICAL DATA:  Pneumoperitoneum EXAM: PORTABLE CHEST 1 VIEW COMPARISON:  June 25, 2023, 3:27 a.m. FINDINGS: Comparison with prior examination there is no change in the position of the endotracheal tube the tip of which remains 1 cm from carina. The nasogastric tube remains in anatomic position the tip in the stomach. IMPRESSION: *No change in the position of the endotracheal tube the tip of which remains 1 cm from carina. *Nasogastric tube remains in anatomic position. Bilateral reticular interstitial infiltrates. Edema versus pneumonia? *Left IJ CVP line tip in the superior vena cava. Postsurgical changes of the thoracolumbar spine Electronically Signed   By: Fredrich Jefferson M.D.   On: 07/08/2023 08:42    DG CHEST PORT 1 VIEW Result Date: 07/08/2023 EXAM: 1 VIEW XRAY OF THE CHEST 07/21/2023 03:33:50 AM COMPARISON: Earlier today at 02:27 hours. CLINICAL HISTORY: Encounter for central line placement. FINDINGS: LUNGS AND PLEURA: Stable mild patchy upper lobe opacities, suspicious for aspiration/pneumonia. No pneumothorax. HEART AND MEDIASTINUM: No acute abnormality of the cardiac and mediastinal silhouettes. BONES AND SOFT TISSUES: No acute osseous abnormality. LINES AND TUBES: Interval placement of a left IJ venous catheter in the proximal SVC. Endotracheal tube terminates at 6 cm above the carina. Enteric tube is  obscured distally by a defibrillator pad. IMPRESSION: 1. Interval placement of a left IJ venous catheter in the proximal SVC. No pneumothorax. 2. Stable mild patchy upper lobe opacities, suspicious for aspiration/pneumonia. Electronically signed by: Zadie Herter MD 07/11/2023 03:37 AM EDT RP Workstation: [id number]   DG Chest Port 1 View Result Date: 07/19/2023 EXAM: 1 VIEW XRAY OF THE CHEST 07/22/2023 02:30:02 AM COMPARISON: 06/24/2023 CLINICAL HISTORY: [id number] Hypoxia [id number]. Hypoxia.ETT. NG tube. FINDINGS: LUNGS AND PLEURA: Mild patchy opacities in the bilateral upper lobes, new, worrisome for aspiration/pneumonia. No pleural effusion. No pneumothorax. HEART AND MEDIASTINUM: No acute abnormality of the cardiac and mediastinal silhouettes. BONES AND SOFT TISSUES: No acute osseous abnormality. Defibrillator pads overlying the lower hemithorax. LINES AND TUBES: Endotracheal tube is positioned 3.5 cm above the carina. Enteric tube courses below the diaphragm. IMPRESSION: 1. Mild patchy opacities in the bilateral upper lobes, new, worrisome for aspiration/pneumonia. 2. Support apparatus as above. Electronically signed by: Zadie Herter MD 07/10/2023 02:34 AM EDT RP Workstation: [id number]   DG Abd Portable 1V Result Date: 06/24/2023 EXAM: 1 VIEW XRAY OF THE ABDOMEN SUPINE 06/24/2023 06:57:00 PM  COMPARISON: None available. CLINICAL HISTORY: [id number] Encounter for imaging study to confirm nasogastric (NG) tube placement [id number]. S/p NG tube placement. FINDINGS: BOWEL: Dilated small bowel and right colon, favoring adynamic ileus. PERITONEUM AND SOFT TISSUES: Cholecystectomy clips. BONES: Lumbar spine fixation hardware. No acute osseous abnormality. LINES AND TUBES: Enteric tube terminates in the gastric cardia. IMPRESSION: 1. Enteric tube terminates in the gastric cardia. 2. Dilated small bowel and right colon, favoring adynamic ileus. Electronically signed by: Zadie Herter MD 06/24/2023 07:05 PM EDT RP Workstation: [id number]   DG Abd Portable 1V Result Date: 06/24/2023 EXAM: 1 VIEW XRAY OF THE ABDOMEN SUPINE 06/24/2023 06:08:00 PM COMPARISON: 06/21/2023. CLINICAL HISTORY: [id number] Ileus (HCC) [id number]. Severe abdominal pain. SOB. FINDINGS: BOWEL: Dilated small bowel in the central abdomen. Dilated right colon. Overall appearance favored adynamic ileus over a small bowel obstruction. PERITONEUM AND SOFT TISSUES: No abnormal calcifications. Laparoscopic band in satisfactory position. BONES: Lumbar spine fixation hardware. Bilateral hip arthroplasties. Right sacroiliac joint fusion. IMPRESSION: 1. Dilated small bowel and right colon, favoring adynamic ileus over small bowel obstruction. Electronically signed by: Zadie Herter MD 06/24/2023 07:04 PM EDT RP Workstation: [id number]   DG Chest Port 1 View Result Date: 06/24/2023 CLINICAL DATA:  Short of breath EXAM: PORTABLE CHEST 1 VIEW COMPARISON:  Prior chest x-ray 06/23/2023 FINDINGS: Stable cardiac and mediastinal contours. The right upper extremity PICC has been removed. Trace bibasilar atelectasis. No pulmonary edema, focal airspace infiltrate, pleural effusion or pneumothorax. Surgical changes of prior bilateral rotator cuff repair. IMPRESSION: Stable cardiomegaly without evidence of acute cardiopulmonary process. Electronically Signed   By: Fernando Hoyer M.D.   On: 06/24/2023 18:54   US  EKG SITE RITE Result Date: 06/24/2023 If Site Rite image not attached, placement could not be confirmed due to current cardiac rhythm.  DG CHEST PORT 1 VIEW Result Date: 06/23/2023 CLINICAL DATA:  PICC placement EXAM: PORTABLE CHEST 1 VIEW COMPARISON:  None Available. FINDINGS: The heart size and mediastinal contours are within normal limits. No pneumothorax or pleural effusion. Linear bibasilar subsegmental atelectasis or scarring. There are thoracic degenerative changes. Thoracolumbar spine fixation hardware is partially imaged. Right-sided PICC tip proximal SVC. IMPRESSION: PICC tip proximal SVC. Electronically Signed   By: Sydell Eva M.D.   On: 06/23/2023 11:59    Cardiac Studies   Echo   Patient Profile     83 y.o. male  history of atrial fibrillation status post ablation in 2019 and Watchman in 05/2022 (due to hematuria), CAD status post PCI in 2012, chronic diastolic heart failure, PE, OSA, T2DM, thoracic aortic aneurysm   Assessment & Plan    PEA arrest/cardiogenic shock/Acute respiratory failure Aspiration PNA SBO/ileus  PAF - still in afib but rate controlled. Continue current dose of metoprolol . S/p watchman. Acute on chronic diastolic heart failure -  CAD    Status post PEA with ROSC and transferred to the ICU overnight.  He is now intubated and sedated.  Metabolic encephalopathy/cardiogenic shock on multiple pressors and is also being managed by the critical care team.  Overall prognosis is poor.  Family by the bedside.  Concern for small bowel obstruction he does have an NG tube in.  In terms of his atrial fibrillation he is in A-fib but rate controlled he is currently on amiodarone  and IV metoprolol .  For now discussions for conservative treatment.\\  CRITICAL CARE Performed by: Saphira Lahmann  Total critical care time: 30 minutes. Critical care time was exclusive of separately billable procedures and treating other  patients. Critical care was necessary to treat or prevent imminent or life-threatening deterioration. Critical care was time spent personally by me on the following activities: development of treatment plan with patient and/or surrogate as well as nursing, discussions with consultants, evaluation of patient's response to treatment, examination of patient, obtaining history from patient or surrogate, ordering and performing treatments and interventions, ordering and review of laboratory studies, ordering and review of radiographic studies, pulse oximetry and re-evaluation of patient's condition.    Signed, Colbie Danner, DO [facility] [place]  [facility]  07/07/2023 11:41 AM    For questions or updates, please contact [facility] Please consult [email or web address] for contact info under Cardiology/STEMI.      Signed, Preeya Cleckley, DO  07/22/2023, 9:57 AM

## 2023-07-24 NOTE — Death Summary Note (Signed)
 DEATH SUMMARY   Patient Details  Name: Carlos Dawson MRN: [id number] DOB: 1940-12-06  Admission/Discharge Information   Admit Date:  Jun 30, 2023  Date of Death: Date of Death: 07-14-23  Time of Death: Time of Death: 1744/07/20  Length of Stay: 07-23-23  Referring Physician: Tena Feeling, MD   Reason(s) for Hospitalization  Hip arthroplasty  Diagnoses  Preliminary cause of death: Ileus  Secondary Diagnoses (including complications and co-morbidities):  Principal Problem:   Ileus (HCC) Active Problems:   OA (osteoarthritis) of hip   Paroxysmal atrial fibrillation (HCC)   Hypotension   SOB (shortness of breath)   Anticoagulation management encounter   Primary osteoarthritis of left hip   Status post hip replacement   NSVT (nonsustained ventricular tachycardia) (HCC)   Acute blood loss anemia   Cardiac arrest (HCC)   Pressure injury of skin   DNR (do not resuscitate)   Goals of care, counseling/discussion   Encounter for palliative care AKI  Lactic acidosis Coagulopathy NAGMA   Brief Hospital Course (including significant findings, care, treatment, and services provided and events leading to death)  Carlos Dawson is a 83 y.o. year old male who was admitted 30-Jun-2023 for elective total L hip arthroplasty. Post operative course was complicated by Afib RVR, hypotension for which TRH was consulted 5/22, and cardiology consulted 5/23. Course further complicated by post op ileus for which GI was consulted 5/27 and CCS 5/28. He had some improvement with NGT decompression and had started taking small volume POs on 5/31 and having some bowel function 6/1.  Over the course of 6/1 late day/evening he had progressive abdominal pain. His NGT was replaced with bilious output. He had a respiratory decline and subsequent cardiac arrest with resuscitation x 30 minutes before being transferred to the ICU with PCCM consulting July 14, 2023.   He remained critically ill 07-14-2023, on 4 pressors and increasing MV support.  CTA chest was negative for PE and CT a/p without perforation but does show ongoing ileus. He had progressive multisystem organ failure over the course of the day and several family discussions occurred. A decision was reached to transition to comfort care. He was compassionately extubated and died peacefully 07-14-23 at 1746, with family at the bedside     Pertinent Labs and Studies  Significant Diagnostic Studies CT ABDOMEN PELVIS W CONTRAST Result Date: 07-14-23 CLINICAL DATA:  Recurrent postoperative ileus status post total hip arthroplasty on 2023/06/30 EXAM: CT ABDOMEN AND PELVIS WITH CONTRAST TECHNIQUE: Multidetector CT imaging of the abdomen and pelvis was performed using the standard protocol following bolus administration of intravenous contrast. RADIATION DOSE REDUCTION: This exam was performed according to the departmental dose-optimization program which includes automated exposure control, adjustment of the mA and/or kV according to patient size and/or use of iterative reconstruction technique. CONTRAST:  80mL OMNIPAQUE  IOHEXOL  350 MG/ML SOLN COMPARISON:  CT abdomen and pelvis dated 06/19/2023 FINDINGS: Decreased sensitivity and specificity for detailed findings due to motion artifact. Lower chest: Please see separately dictated CTA chest report for detailed findings. Hepatobiliary: No focal hepatic lesions. No intra or extrahepatic biliary ductal dilation. Cholecystectomy. Pancreas: No focal lesions or main ductal dilation. Spleen: Normal in size without focal abnormality. Adrenals/Urinary Tract: No adrenal nodules. No suspicious renal mass, calculi or hydronephrosis. Suboptimal evaluation of ureterovesical junction due to beam hardening artifact from hip arthroplasties. Bladder appears decompressed with urinary catheter in-situ, although suboptimally evaluated due to beam hardening artifact. Stomach/Bowel: Gastric band in-situ at the level of the gastroesophageal junction. Enteric tube  terminates in the distal gastric body. Normal appearance of the stomach. Dependent hyperattenuating material within the cecum. Mildly dilated fluid-filled small bowel loops throughout the abdomen with areas of gradual caliber transition in the right lower quadrant in the region of the terminal ileum. Mild segmental dilation of the colon without abrupt caliber transition to suggest obstruction. Appendix is not discretely seen. Vascular/Lymphatic: Aortic atherosclerosis. Varices within the midline lower abdomen. Prominent mesenteric lymph node measures 15 mm (4:43), unchanged. Reproductive: Suboptimal evaluation of the prostate due to beam hardening artifact from hip arthroplasties. Other: Trace mesenteric free fluid. No free air or fluid collection. Musculoskeletal: No acute or abnormal lytic or blastic osseous lesions. Bilateral hip arthroplasty and hardware transfixing the right sacroiliac joint. Status post fusion of T10-L5 with unchanged grade 1 retrolisthesis at L1-2. Unchanged partially imaged fracture of the anterior superior endplate of T8. Small fat-containing bilateral inguinal hernias. IMPRESSION: 1. Mildly dilated fluid-filled small bowel loops throughout the abdomen with areas of gradual caliber transition in the right lower quadrant in the region of the terminal ileum, likely ileus. 2. Mild segmental dilation of the colon without abrupt caliber transition to suggest obstruction. 3. Unchanged partially imaged fracture of the anterior superior endplate of T8. 4. Aortic Atherosclerosis (ICD10-I70.0). Electronically Signed   By: Limin  Xu M.D.   On: 07/08/2023 10:24   CT Angio Chest Pulmonary Embolism (PE) W or WO Contrast Result Date: 07/20/2023 CLINICAL DATA:  Suspected pulmonary embolism EXAM: CT ANGIOGRAPHY CHEST WITH CONTRAST TECHNIQUE: Multidetector CT imaging of the chest was performed using the standard protocol during bolus administration of intravenous contrast. Multiplanar CT image  reconstructions and MIPs were obtained to evaluate the vascular anatomy. RADIATION DOSE REDUCTION: This exam was performed according to the departmental dose-optimization program which includes automated exposure control, adjustment of the mA and/or kV according to patient size and/or use of iterative reconstruction technique. CONTRAST:  80mL OMNIPAQUE  IOHEXOL  350 MG/ML SOLN COMPARISON:  November 30, 2021 FINDINGS: Cardiovascular: Satisfactory opacification of the pulmonary arteries to the segmental level. No evidence of pulmonary embolism. Normal heart size. No pericardial effusion. Mediastinum/Nodes: No enlarged mediastinal, hilar, or axillary lymph nodes. Thyroid  gland, trachea, and esophagus demonstrate no significant findings. Intubated, endotracheal tube above carina Nasogastric tube in the stomach Occluder device in the left atrial appendage. Lungs/Pleura: Bilateral basilar pulmonary infiltrates and atelectasis with some consolidation of the left lower lobe correlate with pneumonia. Mild bilateral congestive changes. No significant pleural effusions with a small bilateral pleural reactions Upper Abdomen: No acute abnormality. Musculoskeletal: Fracture of the proximal third of the sternum correlate clinically. Versus artifact on the sagittal reconstructions. No acute or significant osseous findings. Review of the MIP images confirms the above findings. IMPRESSION: *No evidence of pulmonary embolism. *Bilateral basilar pulmonary infiltrates and atelectasis with some consolidation of the left lower lobe correlate with pneumonia. *Mild bilateral congestive changes. *No significant pleural effusions with a small bilateral pleural reactions. Electronically Signed   By: Fredrich Jefferson M.D.   On: 06/26/2023 10:12   DG Abd 2 Views Result Date: 07/04/2023 CLINICAL DATA:  Evaluation of pneumoperitoneum EXAM: ABDOMEN - 2 VIEW COMPARISON:  Abdominal radiograph dated 06/24/2023 FINDINGS: Inferior approach urinary catheter  tip projects over the midline lower pelvis. Diffuse gas-filled dilation of small and large bowel loops throughout the abdomen. No definite supine finding of free air or pneumatosis. No abnormal radio-opaque calculi or mass effect. No acute or substantial osseous abnormality. The sacrum and coccyx are partially obscured by overlying bowel contents. Thoracolumbar spinal fusion  hardware appears intact. Bilateral hip arthroplasties, partially imaged on the right, without hardware complications. IMPRESSION: Diffuse gas-filled dilation of small and large bowel loops throughout the abdomen, likely ileus. No definite supine finding of free air or pneumatosis. Electronically Signed   By: Limin  Xu M.D.   On: 07/23/2023 08:44   DG CHEST PORT 1 VIEW Result Date: 07/16/2023 CLINICAL DATA:  Pneumoperitoneum EXAM: PORTABLE CHEST 1 VIEW COMPARISON:  June 25, 2023, 3:27 a.m. FINDINGS: Comparison with prior examination there is no change in the position of the endotracheal tube the tip of which remains 1 cm from carina. The nasogastric tube remains in anatomic position the tip in the stomach. IMPRESSION: *No change in the position of the endotracheal tube the tip of which remains 1 cm from carina. *Nasogastric tube remains in anatomic position. Bilateral reticular interstitial infiltrates. Edema versus pneumonia? *Left IJ CVP line tip in the superior vena cava. Postsurgical changes of the thoracolumbar spine Electronically Signed   By: Fredrich Jefferson M.D.   On: 06/26/2023 08:42   DG CHEST PORT 1 VIEW Result Date: 07/09/2023 EXAM: 1 VIEW XRAY OF THE CHEST 07/04/2023 03:33:50 AM COMPARISON: Earlier today at 02:27 hours. CLINICAL HISTORY: Encounter for central line placement. FINDINGS: LUNGS AND PLEURA: Stable mild patchy upper lobe opacities, suspicious for aspiration/pneumonia. No pneumothorax. HEART AND MEDIASTINUM: No acute abnormality of the cardiac and mediastinal silhouettes. BONES AND SOFT TISSUES: No acute osseous  abnormality. LINES AND TUBES: Interval placement of a left IJ venous catheter in the proximal SVC. Endotracheal tube terminates at 6 cm above the carina. Enteric tube is obscured distally by a defibrillator pad. IMPRESSION: 1. Interval placement of a left IJ venous catheter in the proximal SVC. No pneumothorax. 2. Stable mild patchy upper lobe opacities, suspicious for aspiration/pneumonia. Electronically signed by: Zadie Herter MD 07/11/2023 03:37 AM EDT RP Workstation: [id number]   DG Chest Port 1 View Result Date: 07/04/2023 EXAM: 1 VIEW XRAY OF THE CHEST 07/21/2023 02:30:02 AM COMPARISON: 06/24/2023 CLINICAL HISTORY: [id number] Hypoxia [id number]. Hypoxia.ETT. NG tube. FINDINGS: LUNGS AND PLEURA: Mild patchy opacities in the bilateral upper lobes, new, worrisome for aspiration/pneumonia. No pleural effusion. No pneumothorax. HEART AND MEDIASTINUM: No acute abnormality of the cardiac and mediastinal silhouettes. BONES AND SOFT TISSUES: No acute osseous abnormality. Defibrillator pads overlying the lower hemithorax. LINES AND TUBES: Endotracheal tube is positioned 3.5 cm above the carina. Enteric tube courses below the diaphragm. IMPRESSION: 1. Mild patchy opacities in the bilateral upper lobes, new, worrisome for aspiration/pneumonia. 2. Support apparatus as above. Electronically signed by: Zadie Herter MD 07/16/2023 02:34 AM EDT RP Workstation: [id number]   DG Abd Portable 1V Result Date: 06/24/2023 EXAM: 1 VIEW XRAY OF THE ABDOMEN SUPINE 06/24/2023 06:57:00 PM COMPARISON: None available. CLINICAL HISTORY: [id number] Encounter for imaging study to confirm nasogastric (NG) tube placement [id number]. S/p NG tube placement. FINDINGS: BOWEL: Dilated small bowel and right colon, favoring adynamic ileus. PERITONEUM AND SOFT TISSUES: Cholecystectomy clips. BONES: Lumbar spine fixation hardware. No acute osseous abnormality. LINES AND TUBES: Enteric tube terminates in the gastric cardia. IMPRESSION: 1. Enteric tube  terminates in the gastric cardia. 2. Dilated small bowel and right colon, favoring adynamic ileus. Electronically signed by: Zadie Herter MD 06/24/2023 07:05 PM EDT RP Workstation: [id number]   DG Abd Portable 1V Result Date: 06/24/2023 EXAM: 1 VIEW XRAY OF THE ABDOMEN SUPINE 06/24/2023 06:08:00 PM COMPARISON: 06/21/2023. CLINICAL HISTORY: [id number] Ileus (HCC) [id number]. Severe abdominal pain. SOB. FINDINGS: BOWEL: Dilated small bowel in the central abdomen. Dilated right colon. Overall  appearance favored adynamic ileus over a small bowel obstruction. PERITONEUM AND SOFT TISSUES: No abnormal calcifications. Laparoscopic band in satisfactory position. BONES: Lumbar spine fixation hardware. Bilateral hip arthroplasties. Right sacroiliac joint fusion. IMPRESSION: 1. Dilated small bowel and right colon, favoring adynamic ileus over small bowel obstruction. Electronically signed by: Zadie Herter MD 06/24/2023 07:04 PM EDT RP Workstation: [id number]   DG Chest Port 1 View Result Date: 06/24/2023 CLINICAL DATA:  Short of breath EXAM: PORTABLE CHEST 1 VIEW COMPARISON:  Prior chest x-ray 06/23/2023 FINDINGS: Stable cardiac and mediastinal contours. The right upper extremity PICC has been removed. Trace bibasilar atelectasis. No pulmonary edema, focal airspace infiltrate, pleural effusion or pneumothorax. Surgical changes of prior bilateral rotator cuff repair. IMPRESSION: Stable cardiomegaly without evidence of acute cardiopulmonary process. Electronically Signed   By: Fernando Hoyer M.D.   On: 06/24/2023 18:54   US  EKG SITE RITE Result Date: 06/24/2023 If Site Rite image not attached, placement could not be confirmed due to current cardiac rhythm.  DG CHEST PORT 1 VIEW Result Date: 06/23/2023 CLINICAL DATA:  PICC placement EXAM: PORTABLE CHEST 1 VIEW COMPARISON:  None Available. FINDINGS: The heart size and mediastinal contours are within normal limits. No pneumothorax or pleural effusion. Linear bibasilar  subsegmental atelectasis or scarring. There are thoracic degenerative changes. Thoracolumbar spine fixation hardware is partially imaged. Right-sided PICC tip proximal SVC. IMPRESSION: PICC tip proximal SVC. Electronically Signed   By: Sydell Eva M.D.   On: 06/23/2023 11:59   DG Abd Portable 1V Result Date: 06/21/2023 CLINICAL DATA:  Small bowel obstruction. EXAM: PORTABLE ABDOMEN - 1 VIEW COMPARISON:  06/20/2023, CT 06/19/2023 FINDINGS: NG tube in the proximal stomach. Access tubing for gastric banding device noted. Posterior lumbar fusion. Dilated loops of small bowel appears slightly improved compared to prior. Loops measure up to 4.9 cm compared to 5.4 cm. Oral contrast not readily identified in the stomach, colon or small bowel. Gas distended ascending colon again noted. Gas in the descending colon. No clear gas in the rectum. IMPRESSION: 1. Slight improvement in small bowel distension. 2. NG tube in stomach. 3. The administered oral contrast through the NG tube on 06/20/2018 is not readily identifiable. Potential dilution effect. Electronically Signed   By: Deboraha Fallow M.D.   On: 06/21/2023 08:39   DG Abd Portable 1V-Small Bowel Obstruction Protocol-initial, 8 hr delay Result Date: 06/20/2023 CLINICAL DATA:  Small bowel obstruction, 8 hour delay. EXAM: PORTABLE ABDOMEN - 1 VIEW COMPARISON:  CT yesterday FINDINGS: No enteric contrast in the colon. Persistent air and contrast distension of small bowel. There is likely air-filled colon in the right abdomen. Tip of the enteric tube in the stomach. Gastric band is faintly visualized. Cholecystectomy clips in the right upper quadrant. IMPRESSION: No enteric contrast in the colon. Enteric contrast persists within dilated small bowel. Persisting gaseous bowel distension. Recommend 24 hour delayed film. Electronically Signed   By: Chadwick Colonel M.D.   On: 06/20/2023 20:57   CT ABDOMEN PELVIS W CONTRAST Result Date: 06/19/2023 CLINICAL DATA:   Bowel obstruction suspected. Abdominal distension, minimal stool output. X-ray suggests ileus. Concern for bowel obstruction. EXAM: CT ABDOMEN AND PELVIS WITH CONTRAST TECHNIQUE: Multidetector CT imaging of the abdomen and pelvis was performed using the standard protocol following bolus administration of intravenous contrast. RADIATION DOSE REDUCTION: This exam was performed according to the departmental dose-optimization program which includes automated exposure control, adjustment of the mA and/or kV according to patient size and/or use of iterative reconstruction technique. CONTRAST:  100mL  OMNIPAQUE  IOHEXOL  300 MG/ML  SOLN COMPARISON:  03/24/2022, plain film today. FINDINGS: Lower chest: Trace right pleural effusion. Rounded airspace opacity posteriorly in the left lower lobe is new since prior study and measures 2 cm, most likely rounded atelectasis or early infiltrate. Hepatobiliary: No focal liver abnormality is seen. Status post cholecystectomy. No biliary dilatation. Pancreas: No focal abnormality or ductal dilatation. Spleen: No focal abnormality.  Normal size. Adrenals/Urinary Tract: No adrenal abnormality. No focal renal abnormality. No stones or hydronephrosis. Urinary bladder is unremarkable. Stomach/Bowel: NG tube in the stomach. Lap band device in place near the GE junction. Stomach is moderately distended. There is fluid and gaseous distention of small bowel loops into the pelvis. Distal small bowel is decompressed. Right colon and transverse colon are also distended with gas and fluid with a transition point in the distal transverse colon. Left colon is decompressed. Vascular/Lymphatic: Aortic atherosclerosis. No evidence of aneurysm or adenopathy. Reproductive: No visible focal abnormality. Other: Trace ascites adjacent to the liver and spleen.  No free air. Musculoskeletal: No acute bony abnormality. Postoperative changes in the thoracolumbar spine, across the SI joints, and in the hips from  bilateral hip replacements. IMPRESSION: Dilated small bowel into the pelvis with decompressed distal small bowel. Right colon and transverse colon are also dilated with transition to decompressed left colon. Pattern is nonspecific but could reflect distal small bowel obstruction or ileus. Trace right pleural effusion and ascites. Aortic atherosclerosis. Electronically Signed   By: Janeece Mechanic M.D.   On: 06/19/2023 16:58   DG Abd 1 View Result Date: 06/19/2023 CLINICAL DATA:  Ileus. EXAM: ABDOMEN - 1 VIEW COMPARISON:  Abdominal radiograph dated 06/18/2023. FINDINGS: Enteric tube with tip in the upper abdomen likely in the proximal stomach. Persistent diffuse small bowel dilatation. No acute osseous pathology. IMPRESSION: 1. Enteric tube with tip in the proximal stomach. 2. Persistent diffuse small bowel dilatation. Electronically Signed   By: Angus Bark M.D.   On: 06/19/2023 11:40   DG CHEST PORT 1 VIEW Result Date: 06/18/2023 CLINICAL DATA:  Status post PICC placement. EXAM: PORTABLE CHEST 1 VIEW COMPARISON:  06/14/2023 FINDINGS: New right upper extremity PICC tip at the SVC brachiocephalic confluence. No pneumothorax. Tip and side port of enteric tube below the diaphragm in the stomach. Stable heart size and mediastinal contours. Bandlike atelectasis at the left lung base. Slight improvement in right basilar atelectasis from prior. No pulmonary edema or large pleural effusion. Left costophrenic angle is excluded from the field of view. IMPRESSION: 1. New right upper extremity PICC tip at the SVC brachiocephalic confluence. No pneumothorax. 2. Bibasilar atelectasis, slightly improved on the right. Electronically Signed   By: Chadwick Colonel M.D.   On: 06/18/2023 14:21   DG Abd 1 View Result Date: 06/18/2023 CLINICAL DATA:  Nasogastric tube placement EXAM: ABDOMEN - 1 VIEW COMPARISON:  06/16/2023 FINDINGS: A nasogastric tube is been placed with tip and side-port in the gastric fundus. Dilated loops  of small bowel up to 5.2 cm in diameter. Gaseous prominence of the colon. Postoperative findings in the thoracolumbar spine, right SI joint, and both hips. Lap band noted with 1:30-7:30 clock face orientation on this frontal projection. Bibasilar bandlike densities favoring atelectasis. Lower thoracic spondylosis. IMPRESSION: 1. Nasogastric tube tip and side-port in the gastric fundus. 2. Dilated loops of small bowel up to 5.2 cm in diameter. Gaseous prominence of the colon. Appearance could be from ileus or bowel obstruction. 3. Bibasilar atelectasis. 4. Postoperative findings in the thoracolumbar spine, right SI  joint, and both hips. Electronically Signed   By: Freida Jes M.D.   On: 06/18/2023 13:00   US  EKG SITE RITE Result Date: 06/18/2023 If Site Rite image not attached, placement could not be confirmed due to current cardiac rhythm.  DG Abd 1 View Result Date: 06/16/2023 CLINICAL DATA:  Constipation, lower abdominal pain, distended abdomen EXAM: ABDOMEN - 1 VIEW COMPARISON:  06/11/2023 FINDINGS: Two supine frontal views of the abdomen and pelvis are obtained. There is marked gaseous distention of the colon consistent with ileus. No evidence of small-bowel obstruction. No masses or abnormal calcifications. Postsurgical changes are seen from thoracolumbar spinal fusion and bilateral hip arthroplasties. IMPRESSION: 1. Diffuse gaseous distention of the colon most consistent with ileus. No evidence of small-bowel obstruction. Electronically Signed   By: Bobbye Burrow M.D.   On: 06/16/2023 22:37   ECHOCARDIOGRAM COMPLETE Result Date: 06/15/2023    ECHOCARDIOGRAM REPORT   Patient Name:   Carlos Dawson Date of Exam: 06/15/2023 Medical Rec #:  [id number]        Height:       65.5 in Accession #:    [id number]       Weight:       252.6 lb Date of Birth:  [birth year]        BSA:          2.197 m Patient Age:    82 years         BP:           114/61 mmHg Patient Gender: M                HR:           91  bpm. Exam Location:  Inpatient Procedure: 2D Echo, Cardiac Doppler and Color Doppler (Both Spectral and Color            Flow Doppler were utilized during procedure). STAT ECHO Indications:    Pericardial effusion  History:        Patient has prior history of Echocardiogram examinations, most                 recent 10/26/2022. CHF, CAD, Arrythmias:Atrial Fibrillation,                 Signs/Symptoms:Chest Pain; Risk Factors:Dyslipidemia, Sleep                 Apnea and Diabetes. CKD stage 3.  Sonographer:    Juanita Shaw Referring Phys: [id number] SHENG L HALEY IMPRESSIONS  1. Technically difficult; ascending aorta appears to be moderately dilated; suggest CTA or MRA to further assess; no pericardial effusion noted.  2. Left ventricular ejection fraction, by estimation, is 70 to 75%. The left ventricle has hyperdynamic function. The left ventricle has no regional wall motion abnormalities. Left ventricular diastolic parameters are indeterminate.  3. Right ventricular systolic function is normal. The right ventricular size is normal.  4. The mitral valve is normal in structure. No evidence of mitral valve regurgitation. No evidence of mitral stenosis.  5. The aortic valve was not well visualized. Aortic valve regurgitation is not visualized. No aortic stenosis is present.  6. There is mild dilatation of the aortic root, measuring 40 mm. There is moderate dilatation of the ascending aorta, measuring 46 mm. FINDINGS  Left Ventricle: Left ventricular ejection fraction, by estimation, is 70 to 75%. The left ventricle has hyperdynamic function. The left ventricle has no regional wall motion abnormalities. The left ventricular internal cavity size  was normal in size. There is no left ventricular hypertrophy. Left ventricular diastolic function could not be evaluated due to atrial fibrillation. Left ventricular diastolic parameters are indeterminate. Right Ventricle: The right ventricular size is normal. Right ventricular  systolic function is normal. Left Atrium: Left atrial size was normal in size. Right Atrium: Right atrial size was normal in size. Pericardium: There is no evidence of pericardial effusion. Mitral Valve: The mitral valve is normal in structure. No evidence of mitral valve regurgitation. No evidence of mitral valve stenosis. MV peak gradient, 2.5 mmHg. The mean mitral valve gradient is 2.0 mmHg. Tricuspid Valve: The tricuspid valve is normal in structure. Tricuspid valve regurgitation is trivial. No evidence of tricuspid stenosis. Aortic Valve: The aortic valve was not well visualized. Aortic valve regurgitation is not visualized. No aortic stenosis is present. Aortic valve mean gradient measures 7.2 mmHg. Aortic valve peak gradient measures 15.6 mmHg. Aortic valve area, by VTI measures 2.35 cm. Pulmonic Valve: The pulmonic valve was not well visualized. Pulmonic valve regurgitation is trivial. No evidence of pulmonic stenosis. Aorta: The aortic root is normal in size and structure. There is mild dilatation of the aortic root, measuring 40 mm. There is moderate dilatation of the ascending aorta, measuring 46 mm. Venous: The inferior vena cava was not well visualized. IAS/Shunts: The interatrial septum was not well visualized. Additional Comments: Technically difficult; ascending aorta appears to be moderately dilated; suggest CTA or MRA to further assess; no pericardial effusion noted.  LEFT VENTRICLE PLAX 2D LVIDd:         5.30 cm      Diastology LVIDs:         3.60 cm      LV e' medial:    5.77 cm/s LV PW:         1.00 cm      LV E/e' medial:  14.8 LV IVS:        1.00 cm      LV e' lateral:   5.87 cm/s LVOT diam:     2.00 cm      LV E/e' lateral: 14.5 LV SV:         64 LV SV Index:   29 LVOT Area:     3.14 cm  LV Volumes (MOD) LV vol d, MOD A2C: 153.0 ml LV vol d, MOD A4C: 140.0 ml LV vol s, MOD A2C: 34.4 ml LV vol s, MOD A4C: 35.4 ml LV SV MOD A2C:     118.6 ml LV SV MOD A4C:     140.0 ml LV SV MOD BP:       110.3 ml RIGHT VENTRICLE RV Basal diam:  4.30 cm RV Mid diam:    3.20 cm RV S prime:     17.70 cm/s TAPSE (M-mode): 2.4 cm LEFT ATRIUM             Index        RIGHT ATRIUM           Index LA diam:        3.20 cm 1.46 cm/m   RA Area:     16.20 cm LA Vol (A2C):   45.3 ml 20.62 ml/m  RA Volume:   34.90 ml  15.88 ml/m LA Vol (A4C):   45.2 ml 20.57 ml/m LA Biplane Vol: 45.7 ml 20.80 ml/m  AORTIC VALVE                     PULMONIC VALVE AV Area (  Vmax):    2.53 cm      PV Vmax:          1.74 m/s AV Area (Vmean):   2.29 cm      PV Peak grad:     12.2 mmHg AV Area (VTI):     2.35 cm      PR End Diast Vel: 5.02 msec AV Vmax:           197.20 cm/s AV Vmean:          115.020 cm/s AV VTI:            0.272 m AV Peak Grad:      15.6 mmHg AV Mean Grad:      7.2 mmHg LVOT Vmax:         159.00 cm/s LVOT Vmean:        83.800 cm/s LVOT VTI:          0.203 m LVOT/AV VTI ratio: 0.75  AORTA Ao Root diam: 4.00 cm Ao Asc diam:  4.57 cm MITRAL VALVE               TRICUSPID VALVE MV Area (PHT): 2.62 cm    TR Peak grad:   35.5 mmHg MV Area VTI:   2.46 cm    TR Vmax:        298.00 cm/s MV Peak grad:  2.5 mmHg MV Mean grad:  2.0 mmHg    SHUNTS MV Vmax:       0.80 m/s    Systemic VTI:  0.20 m MV Vmean:      65.8 cm/s   Systemic Diam: 2.00 cm MV Decel Time: 290 msec MV E velocity: 85.40 cm/s MV A velocity: 74.90 cm/s MV E/A ratio:  1.14 Alexandria Angel MD Electronically signed by Alexandria Angel MD Signature Date/Time: 06/15/2023/10:12:25 AM    Final    DG CHEST PORT 1 VIEW Result Date: 06/14/2023 CLINICAL DATA:  Shortness of breath. EXAM: PORTABLE CHEST 1 VIEW COMPARISON:  Radiograph 06/15/2022 FINDINGS: Cardiomegaly is stable. Unchanged mediastinal contours. Streaky atelectasis in both lung bases. No confluent airspace disease. No pulmonary edema, large pleural effusion or pneumothorax. Thoracolumbar spinal fusion hardware is partially included in the field of view. IMPRESSION: 1. Streaky atelectasis in both lung bases. 2. Stable  cardiomegaly. Electronically Signed   By: Chadwick Colonel M.D.   On: 06/14/2023 18:16   DG Pelvis Portable Result Date: 06/11/2023 CLINICAL DATA:  Status post total left hip arthroplasty. EXAM: PORTABLE PELVIS 1-2 VIEWS COMPARISON:  KUB 04/10/2023 FINDINGS: Interval total left hip arthroplasty. Partial visualization of prior total right hip arthroplasty. There are again two right sacroiliac fusion bars. Partial visualization of lumbar posterior rod and screw fusion. No perihardware lucency is seen to indicate hardware failure or loosening. Expected postoperative lateral left hip subcutaneous air. The pubic symphysis joint space is maintained. Partial visualization of a gastric band. IMPRESSION: Interval total left hip arthroplasty without evidence of hardware failure. Electronically Signed   By: Bertina Broccoli M.D.   On: 06/11/2023 16:20   DG HIP UNILAT WITH PELVIS 1V LEFT Result Date: 06/11/2023 CLINICAL DATA:  Intraoperative fluoroscopy for total left hip arthroplasty. EXAM: DG HIP (WITH OR WITHOUT PELVIS) 1V*L* COMPARISON:  CT abdomen and pelvis 03/24/2022 FINDINGS: Images were performed intraoperatively without the presence of a radiologist. The patient is undergoing total left hip arthroplasty. Partial visualization of total right hip arthroplasty. There are two right sacroiliac joint fusion bars again noted. No hardware complication is seen.  Total fluoroscopy images: 5 Total fluoroscopy time: 12 seconds Total dose: Radiation Exposure Index (as provided by the fluoroscopic device): 2.1 mGy air Kerma Please see intraoperative findings for further detail. IMPRESSION: Intraoperative fluoroscopy for total left hip arthroplasty. Electronically Signed   By: Bertina Broccoli M.D.   On: 06/11/2023 16:16   DG C-Arm 1-60 Min-No Report Result Date: 06/11/2023 Fluoroscopy was utilized by the requesting physician.  No radiographic interpretation.    Microbiology Recent Results (from the past 240 hours)   Gastrointestinal Panel by PCR , Stool     Status: None   Collection Time: 06/22/23  3:04 PM   Specimen: Stool  Result Value Ref Range Status   Campylobacter species NOT DETECTED NOT DETECTED Final   Plesimonas shigelloides NOT DETECTED NOT DETECTED Final   Salmonella species NOT DETECTED NOT DETECTED Final   Yersinia enterocolitica NOT DETECTED NOT DETECTED Final   Vibrio species NOT DETECTED NOT DETECTED Final   Vibrio cholerae NOT DETECTED NOT DETECTED Final   Enteroaggregative E coli (EAEC) NOT DETECTED NOT DETECTED Final   Enteropathogenic E coli (EPEC) NOT DETECTED NOT DETECTED Final   Enterotoxigenic E coli (ETEC) NOT DETECTED NOT DETECTED Final   Shiga like toxin producing E coli (STEC) NOT DETECTED NOT DETECTED Final   Shigella/Enteroinvasive E coli (EIEC) NOT DETECTED NOT DETECTED Final   Cryptosporidium NOT DETECTED NOT DETECTED Final   Cyclospora cayetanensis NOT DETECTED NOT DETECTED Final   Entamoeba histolytica NOT DETECTED NOT DETECTED Final   Giardia lamblia NOT DETECTED NOT DETECTED Final   Adenovirus F40/41 NOT DETECTED NOT DETECTED Final   Astrovirus NOT DETECTED NOT DETECTED Final   Norovirus GI/GII NOT DETECTED NOT DETECTED Final   Rotavirus A NOT DETECTED NOT DETECTED Final   Sapovirus (I, II, IV, and V) NOT DETECTED NOT DETECTED Final    Comment: Performed at [facility], [address] [place], [place] [address]    Lab Basic Metabolic Panel: Recent Labs  Lab 06/20/23 0454 06/21/23 0441 06/22/23 6045 06/23/23 0425 06/24/23 0424 07/20/2023 0113 06/27/2023 0235 07/03/2023 0527  NA 137 137 144 141 141 139 144 138  K 3.1* 2.8* 3.1* 3.2* 3.6 4.3 4.9 4.1  CL 103 105 112* 112* 111 110 113* 113*  CO2 25 25 25 22 23 26  16* 19*  GLUCOSE 182* 209* 194* 218* 142* 177* 203* 251*  BUN 52* 51* 38* 33* 35* 36* 36* 40*  CREATININE 1.26* 1.19 1.08 0.85 0.94 1.12 1.54* 1.53*  CALCIUM  8.5* 8.1* 8.6* 8.3* 8.8* 8.4* 8.7* 7.7*  MG 2.4 2.4 2.1 2.0 2.0  --  2.1  1.9  PHOS 2.7 2.8 2.8  --  3.2  --  9.0*  --    Liver Function Tests: Recent Labs  Lab 06/20/23 0454 06/21/23 0441 06/23/23 0425 07/08/2023 0113 07/15/2023 0235  AST 15 14* 15 19 81*  ALT 13 12 11 16 27   ALKPHOS 75 70 69 93 110  BILITOT 1.5* 1.5* 1.3* 1.6* 1.2  PROT 5.6* 5.7* 5.6* 5.9* 4.5*  ALBUMIN  3.0* 2.8* 2.9* 3.1* 2.3*   No results for input(s): "LIPASE", "AMYLASE" in the last 168 hours. Recent Labs  Lab 06/19/23 1643 07/14/2023 0527  AMMONIA 51* 37*   CBC: Recent Labs  Lab 06/23/23 0425 06/24/23 0424 07/23/2023 0113 07/19/2023 0235 07/04/2023 0527  WBC 10.2 11.9* 28.9* 32.1* 29.8*  HGB 8.6* 9.4* 10.0* 8.1* 8.8*  HCT 27.5* 31.0* 32.4* 25.1* 29.7*  MCV 110.4* 113.1* 117.8* 120.1* 115.6*  PLT 182 179 243  189 221   Cardiac Enzymes: No results for input(s): "CKTOTAL", "CKMB", "CKMBINDEX", "TROPONINI" in the last 168 hours. Sepsis Labs: Recent Labs  Lab 06/24/23 0424 07/20/2023 0113 07/18/2023 0235 07/22/2023 0527 06/26/2023 1216 07/04/2023 1434  WBC 11.9* 28.9* 32.1* 29.8*  --   --   LATICACIDVEN  --   --  6.7* 4.5* 2.4* 2.0*    Procedures/Operations  5/19 total hip 6/2 CPR 6/2 ETT 6/2 CVC   Darin Edinger Pietra Zuluaga 07/18/2023, 6:38 PM

## 2023-07-24 NOTE — Progress Notes (Addendum)
 [facility] Progress Note  14 Days Post-Op  Subjective: CC:  Pt intubated, sedated.  NG tube replaced yesterday. Code blue yesterday along with afib RVR >> transferred to ICU >> became bradycardic and had cardiac arrest w/ 30 min CPR and ROSC.   Afebrile, HR 111 bpm On amio gtt for afib On epi gtt, neo gtt, levo @ 2 mcg, and stress dose steroids WBC up to 30 from 11 Started on Zosyn this morning.   Sons at bedside. Objective: Vital signs in last 24 hours: Temp:  [97.5 F (36.4 C)-100.6 F (38.1 C)] 100.6 F (38.1 C) (06/02 0817) Pulse Rate:  [25-154] 105 (06/02 0817) Resp:  [8-47] 20 (06/02 0817) BP: (50-155)/(16-123) 116/42 (06/02 0817) SpO2:  [4 %-100 %] 98 % (06/02 0817) FiO2 (%):  [70 %-100 %] 70 % (06/02 0800) Last BM Date : 06/24/23  Intake/Output from previous day: 06/01 0701 - 06/02 0700 In: 1484.2 [I.V.:1467.6; IV Piggyback:16.7] Out: 1000 [Urine:50; Emesis/NG output:950] Intake/Output this shift: Total I/O In: 346.4 [I.V.:344.6; IV Piggyback:1.8] Out: -   PE: Gen:  intubated, critically ill appearing Card:  tachy Pulm:  ventilated respirations Abd: Soft, protuberant, tympanic, not rigid/no peritonitis, NGT with 1150 mL since placement yesterday. Skin: warm and dry, no rashes; ecchymosis of upper extremities  Psych: unable to assess  Lab Results:  Recent Labs    06/26/2023 0235 07/02/2023 0527  WBC 32.1* 29.8*  HGB 8.1* 8.8*  HCT 25.1* 29.7*  PLT 189 221   BMET Recent Labs    07/12/2023 0235 06/29/2023 0527  NA 144 138  K 4.9 4.1  CL 113* 113*  CO2 16* 19*  GLUCOSE 203* 251*  BUN 36* 40*  CREATININE 1.54* 1.53*  CALCIUM  8.7* 7.7*   PT/INR Recent Labs    06/30/2023 0752  LABPROT 24.2*  INR 2.1*   CMP     Component Value Date/Time   NA 138 07/11/2023 0527   NA 140 10/31/2022 0913   K 4.1 06/28/2023 0527   CL 113 (H) 06/24/2023 0527   CO2 19 (L) 07/03/2023 0527   GLUCOSE 251 (H) 06/27/2023 0527   BUN 40 (H) 06/24/2023  0527   BUN 25 10/31/2022 0913   CREATININE 1.53 (H) 07/22/2023 0527   CREATININE 1.11 10/14/2015 0929   CALCIUM  7.7 (L) 07/16/2023 0527   PROT 4.5 (L) 07/20/2023 0235   PROT 5.9 (L) 10/31/2022 0913   ALBUMIN  2.3 (L) 07/22/2023 0235   ALBUMIN  4.0 10/31/2022 0913   AST 81 (H) 07/20/2023 0235   ALT 27 07/02/2023 0235   ALKPHOS 110 07/02/2023 0235   BILITOT 1.2 07/15/2023 0235   BILITOT 1.2 10/31/2022 0913   GFRNONAA 45 (L) 07/12/2023 0527   GFRAA >60 10/21/2018 0726   Lipase     Component Value Date/Time   LIPASE 35 03/09/2022 2158       Studies/Results: DG Abd 2 Views Result Date: 07/18/2023 CLINICAL DATA:  Evaluation of pneumoperitoneum EXAM: ABDOMEN - 2 VIEW COMPARISON:  Abdominal radiograph dated 06/24/2023 FINDINGS: Inferior approach urinary catheter tip projects over the midline lower pelvis. Diffuse gas-filled dilation of small and large bowel loops throughout the abdomen. No definite supine finding of free air or pneumatosis. No abnormal radio-opaque calculi or mass effect. No acute or substantial osseous abnormality. The sacrum and coccyx are partially obscured by overlying bowel contents. Thoracolumbar spinal fusion hardware appears intact. Bilateral hip arthroplasties, partially imaged on the right, without hardware complications. IMPRESSION: Diffuse gas-filled dilation of small and large bowel loops  throughout the abdomen, likely ileus. No definite supine finding of free air or pneumatosis. Electronically Signed   By: Limin  Xu M.D.   On: 07/23/2023 08:44   DG CHEST PORT 1 VIEW Result Date: 06/24/2023 CLINICAL DATA:  Pneumoperitoneum EXAM: PORTABLE CHEST 1 VIEW COMPARISON:  June 25, 2023, 3:27 a.m. FINDINGS: Comparison with prior examination there is no change in the position of the endotracheal tube the tip of which remains 1 cm from carina. The nasogastric tube remains in anatomic position the tip in the stomach. IMPRESSION: *No change in the position of the endotracheal tube  the tip of which remains 1 cm from carina. *Nasogastric tube remains in anatomic position. Bilateral reticular interstitial infiltrates. Edema versus pneumonia? *Left IJ CVP line tip in the superior vena cava. Postsurgical changes of the thoracolumbar spine Electronically Signed   By: Fredrich Jefferson M.D.   On: 07/11/2023 08:42   DG CHEST PORT 1 VIEW Result Date: 07/05/2023 EXAM: 1 VIEW XRAY OF THE CHEST 07/17/2023 03:33:50 AM COMPARISON: Earlier today at 02:27 hours. CLINICAL HISTORY: Encounter for central line placement. FINDINGS: LUNGS AND PLEURA: Stable mild patchy upper lobe opacities, suspicious for aspiration/pneumonia. No pneumothorax. HEART AND MEDIASTINUM: No acute abnormality of the cardiac and mediastinal silhouettes. BONES AND SOFT TISSUES: No acute osseous abnormality. LINES AND TUBES: Interval placement of a left IJ venous catheter in the proximal SVC. Endotracheal tube terminates at 6 cm above the carina. Enteric tube is obscured distally by a defibrillator pad. IMPRESSION: 1. Interval placement of a left IJ venous catheter in the proximal SVC. No pneumothorax. 2. Stable mild patchy upper lobe opacities, suspicious for aspiration/pneumonia. Electronically signed by: Zadie Herter MD 07/17/2023 03:37 AM EDT RP Workstation: [id number]   DG Chest Port 1 View Result Date: 07/07/2023 EXAM: 1 VIEW XRAY OF THE CHEST 07/01/2023 02:30:02 AM COMPARISON: 06/24/2023 CLINICAL HISTORY: [id number] Hypoxia [id number]. Hypoxia.ETT. NG tube. FINDINGS: LUNGS AND PLEURA: Mild patchy opacities in the bilateral upper lobes, new, worrisome for aspiration/pneumonia. No pleural effusion. No pneumothorax. HEART AND MEDIASTINUM: No acute abnormality of the cardiac and mediastinal silhouettes. BONES AND SOFT TISSUES: No acute osseous abnormality. Defibrillator pads overlying the lower hemithorax. LINES AND TUBES: Endotracheal tube is positioned 3.5 cm above the carina. Enteric tube courses below the diaphragm. IMPRESSION: 1. Mild  patchy opacities in the bilateral upper lobes, new, worrisome for aspiration/pneumonia. 2. Support apparatus as above. Electronically signed by: Zadie Herter MD 07/16/2023 02:34 AM EDT RP Workstation: [id number]   DG Abd Portable 1V Result Date: 06/24/2023 EXAM: 1 VIEW XRAY OF THE ABDOMEN SUPINE 06/24/2023 06:57:00 PM COMPARISON: None available. CLINICAL HISTORY: [id number] Encounter for imaging study to confirm nasogastric (NG) tube placement [id number]. S/p NG tube placement. FINDINGS: BOWEL: Dilated small bowel and right colon, favoring adynamic ileus. PERITONEUM AND SOFT TISSUES: Cholecystectomy clips. BONES: Lumbar spine fixation hardware. No acute osseous abnormality. LINES AND TUBES: Enteric tube terminates in the gastric cardia. IMPRESSION: 1. Enteric tube terminates in the gastric cardia. 2. Dilated small bowel and right colon, favoring adynamic ileus. Electronically signed by: Zadie Herter MD 06/24/2023 07:05 PM EDT RP Workstation: [id number]   DG Abd Portable 1V Result Date: 06/24/2023 EXAM: 1 VIEW XRAY OF THE ABDOMEN SUPINE 06/24/2023 06:08:00 PM COMPARISON: 06/21/2023. CLINICAL HISTORY: [id number] Ileus (HCC) [id number]. Severe abdominal pain. SOB. FINDINGS: BOWEL: Dilated small bowel in the central abdomen. Dilated right colon. Overall appearance favored adynamic ileus over a small bowel obstruction. PERITONEUM AND SOFT TISSUES: No abnormal calcifications. Laparoscopic band in satisfactory position. BONES: Lumbar spine  fixation hardware. Bilateral hip arthroplasties. Right sacroiliac joint fusion. IMPRESSION: 1. Dilated small bowel and right colon, favoring adynamic ileus over small bowel obstruction. Electronically signed by: Zadie Herter MD 06/24/2023 07:04 PM EDT RP Workstation: [id number]   DG Chest Port 1 View Result Date: 06/24/2023 CLINICAL DATA:  Short of breath EXAM: PORTABLE CHEST 1 VIEW COMPARISON:  Prior chest x-ray 06/23/2023 FINDINGS: Stable cardiac and mediastinal contours. The right  upper extremity PICC has been removed. Trace bibasilar atelectasis. No pulmonary edema, focal airspace infiltrate, pleural effusion or pneumothorax. Surgical changes of prior bilateral rotator cuff repair. IMPRESSION: Stable cardiomegaly without evidence of acute cardiopulmonary process. Electronically Signed   By: Fernando Hoyer M.D.   On: 06/24/2023 18:54   US  EKG SITE RITE Result Date: 06/24/2023 If Site Rite image not attached, placement could not be confirmed due to current cardiac rhythm.  DG CHEST PORT 1 VIEW Result Date: 06/23/2023 CLINICAL DATA:  PICC placement EXAM: PORTABLE CHEST 1 VIEW COMPARISON:  None Available. FINDINGS: The heart size and mediastinal contours are within normal limits. No pneumothorax or pleural effusion. Linear bibasilar subsegmental atelectasis or scarring. There are thoracic degenerative changes. Thoracolumbar spine fixation hardware is partially imaged. Right-sided PICC tip proximal SVC. IMPRESSION: PICC tip proximal SVC. Electronically Signed   By: Sydell Eva M.D.   On: 06/23/2023 11:59    Anti-infectives: Anti-infectives (From admission, onward)    Start     Dose/Rate Route Frequency Ordered Stop   07/18/2023 0845  piperacillin-tazobactam (ZOSYN) IVPB 3.375 g        3.375 g 12.5 mL/hr over 240 Minutes Intravenous Every 8 hours 07/04/2023 0748     06/11/23 1830  ceFAZolin  (ANCEF ) IVPB 2g/100 mL premix        2 g 200 mL/hr over 30 Minutes Intravenous Every 6 hours 06/11/23 1651 06/12/23 0747   06/11/23 1030  ceFAZolin  (ANCEF ) IVPB 2g/100 mL premix        2 g 200 mL/hr over 30 Minutes Intravenous On call to O.R. 06/11/23 1025 06/11/23 1300        Assessment/Plan  pSBO vs ileus s/p THA by Dr. Rossie Coon 5/19 -afebrile, vitals stable - CT 5/27 shows small bowel dilation with some decompressed distal small bowel in the right pelvis.  - exam and CT are concerning for possible pSBO, could be due to adhesions from previous abdominal surgery.  - SBO  protocol 5/28 >> increased bowel function and decreased NG output. Ongoing but improved intestinal dilation on KUB, NG was removed 5/30 and diet slowly advanced to FLD with ongoing poor PO intake.  - NG tube had to be re-placed 6/1 due to increased abdominal pain and nausea. - cardiac arrest 6/1 s/p 30 mins of CPR, now critically ill and intubated - CT abdomen/pelvis and CT chest/PE study are pending. I do not see any pneumoperitoneum on the CT. Will await final read from radiology. He does have evidence of possible ongoing pSBO, but I do not see any emergent surgical needs this morning. We will follow closely.    FEN: NPO, NGT LIWS, IVF ID: Zosyn started empically this AM by CCM Foley: in place VTE: SCD's, hep gtt Code status: DNR   CAD CHF Afib (not anticoagulated at home?) - on hep gtt here when xarelto  (prescriped post-op after TAH) was held, now indicated for afib with RVR COPD - on home O2 Asthma GERD OSA obesity    LOS: 11 days   I reviewed nursing notes, Consultant CCM notes, hospitalist notes, last  24 h vitals and pain scores, last 48 h intake and output, last 24 h labs and trends, and last 24 h imaging results.  This care required high  level of medical decision making.   Michial Akin, PA-C [facility] Please see Amion for pager number during day hours 7:00am-4:30pm

## 2023-07-24 NOTE — Progress Notes (Signed)
 NAME:  Carlos Dawson, MRN:  [id number], DOB:  03-05-1940, LOS: 11 ADMISSION DATE:  06/11/2023, CONSULTATION DATE:  07/04/2023 REFERRING MD:  Bailey Lesser, PA, CHIEF COMPLAINT:  Post cardiac arrest   History of Present Illness:  83 y/o male with PMH for PAF s/p Watchman 05/2022 (secondary to Hematuria), CAD s/p PCI 2012, Chronic diastolic heart failure, PE/DVT, OSA, DMT2, Prostate cancer s/p radiation, HLD, HFpEF, Thoracic Aortic Aneurysm who underwent a left total hip arthroplasty 5/19 and post operative care complicated by A fib with RVR, Hypotension, Initially on Amiodarone  then Metoprolol , then had Ileus, TPN started via PICC line which then malfunctioned so TPN stopped and his Ileus was imprved then worsened and NGT placed. Tonight he had a code blue.  Seems like he was hypoxic and VBG showing Hypercapnia, then was in A fib with RVR and brought to ICU and then had bradycardia to cardiac arrest witrh intermittent PEA.  He had about 30 min 9of CPR and multiple rounds of Epinephrine  and then placed on Epinephrine  drip and Amiodarone  drip.  Family at bedside when I saw him.  Pertinent  Medical History   Anticoagulant long-term use, Aortic root enlargement (HCC), Ascending aortic aneurysm (HCC), ASCVD (arteriosclerotic cardiovascular disease), CAD (coronary artery disease), CHF (congestive heart failure) (HCC), Chronic back pain, Colonic polyp, Contact lens/glasses fitting, COPD (chronic obstructive pulmonary disease) (HCC), Diastolic dysfunction, DVT (deep venous thrombosis) (HCC), GERD (gastroesophageal reflux disease), Hearing loss, Hearing loss, Hemorrhoids, History of stomach ulcers, Hypertension, IBS (irritable bowel syndrome), LVH (left ventricular hypertrophy), OA (osteoarthritis), Obesities, morbid (HCC), OSA (obstructive sleep apnea), OSA on CPAP, PAF (paroxysmal atrial fibrillation) (HCC), Pneumonia, Presence of Watchman left atrial appendage closure device (06/15/2022), Prostate CA (HCC),  Pulmonary embolism (HCC) (2008), SOB (shortness of breath), Thoracic aortic aneurysm (HCC), and Type II diabetes mellitus (HCC).    Significant Hospital Events: Including procedures, antibiotic start and stop dates in addition to other pertinent events     5/19 elective total L hip arthroplasty 5/22 TRH consult for hypotension / tachycardia  5/23 cards consult pAF  5/27 ileus v SBO, GI consult  5/28 CCS consult  6/2 code, ICU consult  Interim History / Subjective:  3 pressors   Pending CTA chest CT a/p   On hep gtt empirically for possible PE  Objective    Blood pressure (!) 116/42, pulse (!) 105, temperature (!) 100.6 F (38.1 C), temperature source Bladder, resp. rate 20, height 5' 5.5" (1.664 m), weight 107.3 kg, SpO2 98%. CVP:  [7 mmHg-16 mmHg] 12 mmHg  Vent Mode: PRVC FiO2 (%):  [70 %-100 %] 70 % Set Rate:  [14 bmp] 14 bmp Vt Set:  [540 mL] 540 mL PEEP:  [14 cmH20] 14 cmH20 Plateau Pressure:  [16 cmH20] 16 cmH20   Intake/Output Summary (Last 24 hours) at 07/03/2023 0958 Last data filed at 07/20/2023 0957 Gross per 24 hour  Intake 3399.1 ml  Output 1220 ml  Net 2179.1 ml   Filed Weights   06/22/23 0349 06/23/23 0400 06/24/23 2952  Weight: 106.7 kg 104.4 kg 107.3 kg    Examination: General: Critically ill elderly M intubated  HENT: ETT secure L internal jugular CVC Anicteric sclera  Lungs: Mechanically ventilated  Cardiovascular: irir s1s2  Abdomen: round tight absent bowel sounds  Extremities: lower extremity edema  Neuro: Opens eyes to stimulation   Resolved problem list   Assessment and Plan    In-hospital cardiac arrest  Shock -ddx: - PE -- has had SOB post op, has not  yet been r/o. Ddimer not that impressive   -perf -- wbc from 12 to 30 overnight. RN to RN s/o reportedly preceding incr abd distension/pain, hx ileus this admission  -EKG non ischemic  P -starting empiric zosyn, c/f intra abd process  -STAT CTA chest and CT a/p have been ordered for  hours.  -checking a KUB to see if we have obvious pneumotosis while we are sorting out CT timing  -holding empiric hep gtt for now w possible perf, restart if no surgical emergency  -MAP goal > 65  -send T/S, coags  -1L LR  + albumin   -NPO, NGT LIWS  -supportive care, avoid fevers. MRI in 72 hrs.   Acute resp failure w hypoxia and hypercarbia  -possible PE, discussed above  P -Wean as able  -VAP, pulm hygiene  -BDs   Afib RVR  Hx CAD  P -amio -hep gtt on hold now until we can r/o perf -- restart if no surgical emergency evident on imaging   Hx Ileus  -NPO -Cont NGT -as above, sending for CT a/p   AKI NAGMA   P -trend renal indices, UOP  -cont foley   S/p L total hip (5/19)  P -PT/OT when appropriate   Lactic Acidosis  Leukocytosis  P -as above, starting zosyn -follow LA PRN   Hyperglycemia P -incr SSI coverage   DNR status -full scope of medical tx, DNR if arrests. See IPAL 6/2   Best Practice (right click and "Reselect all SmartList Selections" daily)   Diet/type: TPN DVT prophylaxis systemic heparin  -- on hold 6/2  Pressure ulcer(s): N/A GI prophylaxis: PPI Lines: Central line Foley:  Yes, and it is still needed Code Status:  full code   Labs   CBC: Recent Labs  Lab 06/23/23 0425 06/24/23 0424 07/07/2023 0113 06/29/2023 0235 07/14/2023 0527  WBC 10.2 11.9* 28.9* 32.1* 29.8*  HGB 8.6* 9.4* 10.0* 8.1* 8.8*  HCT 27.5* 31.0* 32.4* 25.1* 29.7*  MCV 110.4* 113.1* 117.8* 120.1* 115.6*  PLT 182 179 243 189 221    Basic Metabolic Panel: Recent Labs  Lab 06/20/23 0454 06/21/23 0441 06/22/23 0643 06/23/23 0425 06/24/23 0424 06/28/2023 0113 06/28/2023 0235 07/08/2023 0527  NA 137 137 144 141 141 139 144 138  K 3.1* 2.8* 3.1* 3.2* 3.6 4.3 4.9 4.1  CL 103 105 112* 112* 111 110 113* 113*  CO2 25 25 25 22 23 26  16* 19*  GLUCOSE 182* 209* 194* 218* 142* 177* 203* 251*  BUN 52* 51* 38* 33* 35* 36* 36* 40*  CREATININE 1.26* 1.19 1.08 0.85 0.94 1.12  1.54* 1.53*  CALCIUM  8.5* 8.1* 8.6* 8.3* 8.8* 8.4* 8.7* 7.7*  MG 2.4 2.4 2.1 2.0 2.0  --  2.1 1.9  PHOS 2.7 2.8 2.8  --  3.2  --  9.0*  --    GFR: Estimated Creatinine Clearance: 43.1 mL/min (A) (by C-G formula based on SCr of 1.53 mg/dL (H)). Recent Labs  Lab 06/24/23 0424 07/04/2023 0113 07/20/2023 0235 07/08/2023 0527  WBC 11.9* 28.9* 32.1* 29.8*  LATICACIDVEN  --   --  6.7* 4.5*    Liver Function Tests: Recent Labs  Lab 06/20/23 0454 06/21/23 0441 06/23/23 0425 07/17/2023 0113 07/08/2023 0235  AST 15 14* 15 19 81*  ALT 13 12 11 16 27   ALKPHOS 75 70 69 93 110  BILITOT 1.5* 1.5* 1.3* 1.6* 1.2  PROT 5.6* 5.7* 5.6* 5.9* 4.5*  ALBUMIN  3.0* 2.8* 2.9* 3.1* 2.3*   No results for input(s): "  LIPASE", "AMYLASE" in the last 168 hours. Recent Labs  Lab 06/19/23 1643 07/17/2023 0527  AMMONIA 51* 37*    ABG    Component Value Date/Time   PHART 7.21 (L) 06/30/2023 0418   PCO2ART 47 07/17/2023 0418   PO2ART 290 (H) 07/08/2023 0418   HCO3 18.8 (L) 07/22/2023 0418   TCO2 24 05/07/2014 0923   ACIDBASEDEF 8.7 (H) 07/17/2023 0418   O2SAT 99 07/08/2023 0418     Coagulation Profile: Recent Labs  Lab 06/30/2023 0752  INR 2.1*    Cardiac Enzymes: No results for input(s): "CKTOTAL", "CKMB", "CKMBINDEX", "TROPONINI" in the last 168 hours.  HbA1C: Hgb A1c MFr Bld  Date/Time Value Ref Range Status  05/29/2023 08:24 AM 5.7 (H) 4.8 - 5.6 % Final    Comment:    (NOTE) Pre diabetes:          5.7%-6.4%  Diabetes:              >6.4%  Glycemic control for   <7.0% adults with diabetes   10/15/2018 02:07 PM 6.7 (H) 4.8 - 5.6 % Final    Comment:    (NOTE)         Prediabetes: 5.7 - 6.4         Diabetes: >6.4         Glycemic control for adults with diabetes: <7.0     CBG: Recent Labs  Lab 06/24/23 1635 06/24/23 1937 06/24/23 2337 07/09/2023 0514 07/04/2023 0816  GLUCAP 143* 152* 149* 253* 250*    CRITICAL CARE Performed by: Delories Fetter   Total critical care time: 53  minutes  Critical care time was exclusive of separately billable procedures and treating other patients.  Critical care was necessary to treat or prevent imminent or life-threatening deterioration.  Critical care was time spent personally by me on the following activities: development of treatment plan with patient and/or surrogate as well as nursing, discussions with consultants, evaluation of patient's response to treatment, examination of patient, obtaining history from patient or surrogate, ordering and performing treatments and interventions, ordering and review of laboratory studies, ordering and review of radiographic studies, pulse oximetry and re-evaluation of patient's condition.  Eston Hence MSN, AGACNP-BC [facility] Amion for pager 07/19/2023, 9:58 AM

## 2023-07-24 NOTE — ED Provider Notes (Signed)
 CODE BLUE note:  Patient had been in atrial fibrillation with rapid ventricular response, then developed significant bradycardia and pulseless electrical activity which led to calling CODE BLUE.  CPR was initiated and he received epinephrine , sodium bicarbonate, calcium  chloride.  I did intubate the patient.  Rhythm was still pulseless electrical activity, but bedside ultrasound did show cardiac activity.  Following this, he was noted to have a pulse with good blood pressure.  Unfortunately, heart rate then dropped very low and he went back into pulseless electrical activity.  CPR was reinitiated and he once again received additional epinephrine  and but was still on pulseless electrical activity.  Bedside ultrasound again showed some cardiac activity and pulse was detected.  I have ordered an epinephrine  infusion.  Critical care has been consulted.  Cardiopulmonary Resuscitation (CPR) Procedure Note Directed/Performed by: Alissa April I personally directed ancillary staff and/or performed CPR in an effort to regain return of spontaneous circulation and to maintain cardiac, neuro and systemic perfusion.   Date/Time: 06/30/2023 2:13 AM  Performed by: Alissa April, MDPre-anesthesia Checklist: Patient identified, Emergency Drugs available, Suction available, Patient being monitored and Timeout performed Oxygen  Delivery Method: Ambu bag Preoxygenation: Pre-oxygenation with 100% oxygen  Ventilation: Mask ventilation with difficulty Laryngoscope Size: Glidescope and 3 Grade View: Grade I Tube size: 7.5 mm Number of attempts: 1 Airway Equipment and Method: Rigid stylet and Video-laryngoscopy Placement Confirmation: ETT inserted through vocal cords under direct vision, Positive ETCO2, CO2 detector and Breath sounds checked- equal and bilateral Secured at: 25 cm Tube secured with: ETT holder Dental Injury: Teeth and Oropharynx as per pre-operative assessment  Difficulty Due To: Difficulty was  unanticipated Comments: Difficult intubation because of apparent subglottic stenosis-difficulty passing tube after it went through the vocal cords.        Alissa April, MD 07/18/2023 [phone]

## 2023-07-24 NOTE — Progress Notes (Signed)
 eLink Physician-Brief Progress Note Patient Name: Carlos Dawson DOB: 10/15/1940 MRN: [id number]   Date of Service  07/15/2023  HPI/Events of Note  Notified that family are discussing about code status. They will wait on bedside dayshift MD to discuss.  Also notified that patient is now on epinephrine , phenylephrine  and vasopressin.  eICU Interventions  Ordered to start stress dose steroids Discussed with BSRN     Intervention Category Intermediate Interventions: Communication with other healthcare providers and/or family;Hypotension - evaluation and management  Turner Gains 07/13/2023, 6:30 AM

## 2023-07-24 NOTE — Progress Notes (Signed)
 [facility] Liaison Note  07/21/2023  Carlos Dawson [birth year] [id number]  Location: RN Hospital Liaison screened the patient remotely at [facility].  Insurance: Medicare   Carlos Dawson is a 83 y.o. male who is a Primary Care Patient of Candi Chafe, Altamese Jest, MD Cherene Core with [facility]. The patient was screened for  readmission hospitalization with noted extreme risk score for unplanned readmission risk with 1 IP in 6 months.  The patient was assessed for potential Care Management service needs for post hospital transition for care coordination. Review of patient's electronic medical record reveals patient was admitted was admitted for Primary Osteoarthritis of left hip. 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 No anticipated needs via VBCI.    VBCI Care Management/Population Health does not replace or interfere with any arrangements made by the Inpatient Transition of Care team.   For questions contact:   Lilla Reichert, RN, BSN Hospital Liaison [place]   [facility], Population Health Office Hours MTWF  8:00 am-6:00 pm Direct Dial: [phone] mobile [email or web address]

## 2023-07-24 NOTE — Progress Notes (Signed)
 eLink Physician-Brief Progress Note Patient Name: Carlos Dawson DOB: [birth year] MRN: [id number]   Date of Service  07/13/2023  HPI/Events of Note  Patient stsrting to rouse after all the activity, no sedation ordered.  eICU Interventions  Ordered prn Fentanyl  50 mcg q2 for now so as to be able to assess neurologic status        Bethann [place] Livi Mcgann 07/07/2023, 4:04 AM

## 2023-07-24 NOTE — Progress Notes (Signed)
   07/03/2023 0046  BiPAP/CPAP/SIPAP  $ Non-Invasive Home Ventilator  Subsequent  BiPAP/CPAP/SIPAP Pt Type Adult  Reason BIPAP/CPAP not in use NG tube in place  BiPAP/CPAP /SiPAP Vitals  Pulse Rate (!) 101  Resp 20  SpO2 95 %  MEWS Score/Color  MEWS Score 3  MEWS Score Color Yellow

## 2023-07-24 NOTE — Progress Notes (Signed)
 PT Cancellation Note  Patient Details Name: Carlos Dawson MRN: [id number] DOB: Sep 22, 1940   Cancelled Treatment:     significant medical event last night shortly after 1 am of unresponsiveness, brady which progressed to PEA and then CPR.  Pt currently on VENT.  Will update LPT.  Rehab team to continue to monitor for medical stability.   Bess Broody  PTA Acute  Rehabilitation Services Office M-F          [phone]

## 2023-07-24 NOTE — Progress Notes (Addendum)
 PROGRESS NOTE   Carlos Dawson  MVH:[id number]    DOB: [birth year]    DOA: 06/11/2023  PCP: Tena Feeling, MD   I have briefly reviewed patients previous medical records in [facility].   Brief Hospital Course:  83 year old male with PMH of PAF no longer on anticoagulation, s/p Watchman device 06/15/2022, COPD/asthma not on home O2 prior to admission, OSA on nightly CPAP, anxiety, BPH, prostate cancer, anemia, GERD, DM, chronic diastolic CHF, obesity s/p lap band, HLD, DVT/PE, CAD s/p elective right total hip arthroplasty on 06/11/2023.  Postop course complicated by tachyarrhythmia, hypotension, anemia, ileus versus SBO.  Orthopedics are attending.  TRH/cardiology/GI and general surgery are consulting.  Treated conservatively with n.p.o./NG tube/PICC line with TPN.  Postop ileus resolved, NGT out, advancing diet.  Transferred to telemetry 5/31.  Briefly on floor for a little over 24 hours, course complicated by persistent/recurrent postop ileus, acute metabolic encephalopathy, A-fib with RVR, transferred back to ICU on night of 6/1, then had a prolonged PEA arrest with ROSC, intubated and on mechanical ventilation, cardiogenic shock on multiple vasopressors, PCCM consulted and are to take over care.  Poor prognosis.  TRH will sign off 6/2.  Kindly consult TRH for any further assistance.   Assessment & Plan:   PEA cardiac arrest 6/2/cardiogenic shock/lactic acidosis/acute respiratory failure with hypoxia and hypercapnia/aspiration pneumonia Overnight on 6/1, decompensated due to postop ileus, A-fib with RVR, acute metabolic encephalopathy Transferred to ICU and then had a prolonged PEA cardiac arrest with ROSC after resuscitation Intubated on mechanical ventilation, has not required sedation and is barely arousable On multiple vasopressors CCM consulted, discussed with Eston Hence, PCCM NP at bedside.  PCCM to take over care. Poor prognosis. On IV Zosyn to treat for GI source/aspiration  pneumonia  Postop ileus, persistent versus recurrent,?  SBO Cardiology assisted with management. Treated conservatively with n.p.o., NG tube for several days, TPN via PICC line with serial clinical exams.  Electrolyte replacement. He had clinically improved, briefly transferred out of stepdown unit to telemetry bed On 6/1, developed progressive ileus, required replacement of NG tube, orders in place for TPN.  Acute kidney injury Creatinine went up from 1.1-1.53 Secondary to shock and cardiac arrest Treat supportively with volume, avoid nephrotoxic's and contrast Trend daily BMP.  PAF with RVR/frequent PVCs/BBB morphology Cardiology following. Post hip surgery, he was noted to be hypotensive, tachycardic and thought to be in A-fib with RVR. S/p ablation 04/2017 and Watchman device 05/2022. Cardiology has been following. Initially treated with IV scheduled metoprolol  which was then switched to p.o. briefly but then had to switch back to IV metoprolol  due to prolonged postop ileus and NG tube Now on IV amiodarone  drip with controlled ventricular rate.  Acute on chronic diastolic CHF/Anasarca TTE 5/23: LVEF 70-75% Briefly on IV/p.o. Lasix  Currently appears euvolemic.  CAD s/p PCI As per cardiology, stress PET on 10/24 was a high risk study.  They recommend outpatient follow-up to consider ischemic evaluation Continue aspirin  and metoprolol .  Macrocytic anemia History of iron  deficiency anemia Hemoglobin dropped from 11.2 on 5/6-7.9 on 5/23.  S/p 1 unit of PRBC on 5/23. Hemoglobin down to 8.8 without overt bleeding.  Follow daily CBCs.  Hypokalemia Replaced.  Potassium 4.1 and magnesium  1.9.  Attempt to keep K >4 and mag >2.  S/p left total hip arthroplasty on 5/19 Management per primary service. Not much pain.  Mobilizing with PT. Intermittent mild and transient pain when moving but otherwise pain controlled off of any meds.  Early on on was on Xarelto  or Xarelto  for DVT  prophylaxis.  Currently on IV heparin  drip.  History of thoracic aortic aneurysm Outpatient follow-up  History of PE Per home med rec review, did not appear to be on anticoagulation PTA. PE not ruled out, currently on IV heparin  drip.  BPH/history of prostate cancer Continue Flomax .  Obesity s/p lap banding Noted  OSA on nightly CPAP Currently intubated.  Delirium Likely multifactorial.  Delirium precautions.  Minimize opioids/sedatives.  Family at bedside would be helpful. Do not think that the minimally elevated ammonia 51 on 5/27 is of significance. TSH normal. Unresponsive on ventilator without sedatives.  Type II DM A1c 5.7 on 5/6 Continue SSI.  Leukocytosis Ruling out intra-abdominal infection, concern for aspiration pneumonia and also some stress response On IV Zosyn.  Trend daily CBCs.   Body mass index is 38.76 kg/m./Obesity Complicates care.   DVT prophylaxis: SCDs Start: 07/10/2023 0353 Place TED hose Start: 06/11/23 1651 SCDs Start: 06/11/23 1433     Code Status: Do not attempt resuscitation (DNR) PRE-ARREST INTERVENTIONS DESIRED:  Family Communication: Extensive discussion with patient's spouse and 3 sons at bedside.  Updated care and answered questions.  Advised them that PCCM will be taking over care here on while in ICU and intubated.  This MD will sign off.  They verbalized understanding. Even on 6/1 evening, was on 3 E. along with patient/family for almost 2-2.5 hours while being evaluated for recurrent postop ileus. Disposition:  Unclear.  Poor prognosis while intubated on multiple pressors     Consultants:   [facility] Cardiology [facility] General Surgery  Procedures:   As noted above. PICC line discontinued NGT discontinued  Subjective:  Overnight events noted.  Discussed with Bailey Lesser, TR[facility] NP.  Developed worsening mentation, sustained A-fib with RVR prompting ICU transfer and then had PEA arrest. Seen patient along with all family  members at bedside.  Patient intubated and on mechanical ventilation.  Unresponsive.  Briefly opens eyes spontaneously.  Does not follow instructions.  Objective:   Vitals:   07/22/2023 0640 07/11/2023 0700 07/10/2023 0800 06/24/2023 0817  BP: (!) 91/48 (!) 98/54 (!) 108/52 (!) 116/42  Pulse: 98 (!) 111 71 (!) 105  Resp: 16 (!) 21 18 20   Temp:  100.2 F (37.9 C)  (!) 100.6 F (38.1 C)  TempSrc:  Bladder  Bladder  SpO2: 99% 98% 100% 98%  Weight:      Height:        General exam: Elderly male, moderately built and obese, ill looking, on ventilator Respiratory system: Clear to auscultation anteriorly.  Diminished breath sounds in the bases. Cardiovascular system: S1 and S2 heard, RRR.  No JVD.  No pedal edema.  Trace bilateral lower thigh edema.  Telemetry personally reviewed: Now with A-fib with controlled ventricular rate in the 90s-100s.  Overnight had A-fib with RVR up to 150s-160s. Gastrointestinal system: Abdomen remains distended but not tense, soft and nontender.  No bowel sounds heard.  No organomegaly or masses appreciated. Central nervous system: Unresponsive all stimuli.  Pupils 2 mm bilaterally sluggishly reacting to light Extremities: Symmetric 5 x 5 power. Skin: No rashes, lesions or ulcers Psychiatry: Did not assess    Data Reviewed:   I have personally reviewed following labs and imaging studies   CBC: Recent Labs  Lab 07/02/2023 0113 07/09/2023 0235 07/03/2023 0527  WBC 28.9* 32.1* 29.8*  HGB 10.0* 8.1* 8.8*  HCT 32.4* 25.1* 29.7*  MCV 117.8* 120.1* 115.6*  PLT 243 189  221    Basic Metabolic Panel: Recent Labs  Lab 06/20/23 0454 06/21/23 0441 06/22/23 0643 06/23/23 0425 06/24/23 0424 07/13/2023 0113 07/13/2023 0235 07/04/2023 0527  NA 137 137 144 141 141 139 144 138  K 3.1* 2.8* 3.1* 3.2* 3.6 4.3 4.9 4.1  CL 103 105 112* 112* 111 110 113* 113*  CO2 25 25 25 22 23 26  16* 19*  GLUCOSE 182* 209* 194* 218* 142* 177* 203* 251*  BUN 52* 51* 38* 33* 35* 36* 36* 40*   CREATININE 1.26* 1.19 1.08 0.85 0.94 1.12 1.54* 1.53*  CALCIUM  8.5* 8.1* 8.6* 8.3* 8.8* 8.4* 8.7* 7.7*  MG 2.4 2.4 2.1 2.0 2.0  --  2.1 1.9  PHOS 2.7 2.8 2.8  --  3.2  --  9.0*  --     Liver Function Tests: Recent Labs  Lab 06/20/23 0454 06/21/23 0441 06/23/23 0425 07/09/2023 0113 07/23/2023 0235  AST 15 14* 15 19 81*  ALT 13 12 11 16 27   ALKPHOS 75 70 69 93 110  BILITOT 1.5* 1.5* 1.3* 1.6* 1.2  PROT 5.6* 5.7* 5.6* 5.9* 4.5*  ALBUMIN  3.0* 2.8* 2.9* 3.1* 2.3*    CBG: Recent Labs  Lab 06/24/23 2337 07/15/2023 0514 07/22/2023 0816  GLUCAP 149* 253* 250*    Microbiology Studies:   Recent Results (from the past 240 hours)  Gastrointestinal Panel by PCR , Stool     Status: None   Collection Time: 06/22/23  3:04 PM   Specimen: Stool  Result Value Ref Range Status   Campylobacter species NOT DETECTED NOT DETECTED Final   Plesimonas shigelloides NOT DETECTED NOT DETECTED Final   Salmonella species NOT DETECTED NOT DETECTED Final   Yersinia enterocolitica NOT DETECTED NOT DETECTED Final   Vibrio species NOT DETECTED NOT DETECTED Final   Vibrio cholerae NOT DETECTED NOT DETECTED Final   Enteroaggregative E coli (EAEC) NOT DETECTED NOT DETECTED Final   Enteropathogenic E coli (EPEC) NOT DETECTED NOT DETECTED Final   Enterotoxigenic E coli (ETEC) NOT DETECTED NOT DETECTED Final   Shiga like toxin producing E coli (STEC) NOT DETECTED NOT DETECTED Final   Shigella/Enteroinvasive E coli (EIEC) NOT DETECTED NOT DETECTED Final   Cryptosporidium NOT DETECTED NOT DETECTED Final   Cyclospora cayetanensis NOT DETECTED NOT DETECTED Final   Entamoeba histolytica NOT DETECTED NOT DETECTED Final   Giardia lamblia NOT DETECTED NOT DETECTED Final   Adenovirus F40/41 NOT DETECTED NOT DETECTED Final   Astrovirus NOT DETECTED NOT DETECTED Final   Norovirus GI/GII NOT DETECTED NOT DETECTED Final   Rotavirus A NOT DETECTED NOT DETECTED Final   Sapovirus (I, II, IV, and V) NOT DETECTED NOT DETECTED  Final    Comment: Performed at [facility], [address], [place], [place] [address]    Radiology Studies:  DG CHEST PORT 1 VIEW Result Date: 06/26/2023 EXAM: 1 VIEW XRAY OF THE CHEST 07/21/2023 03:33:50 AM COMPARISON: Earlier today at 02:27 hours. CLINICAL HISTORY: Encounter for central line placement. FINDINGS: LUNGS AND PLEURA: Stable mild patchy upper lobe opacities, suspicious for aspiration/pneumonia. No pneumothorax. HEART AND MEDIASTINUM: No acute abnormality of the cardiac and mediastinal silhouettes. BONES AND SOFT TISSUES: No acute osseous abnormality. LINES AND TUBES: Interval placement of a left IJ venous catheter in the proximal SVC. Endotracheal tube terminates at 6 cm above the carina. Enteric tube is obscured distally by a defibrillator pad. IMPRESSION: 1. Interval placement of a left IJ venous catheter in the proximal SVC. No pneumothorax. 2. Stable mild patchy upper  lobe opacities, suspicious for aspiration/pneumonia. Electronically signed by: Zadie Herter MD 07/20/2023 03:37 AM EDT RP Workstation: [id number]   DG Chest Port 1 View Result Date: 07/21/2023 EXAM: 1 VIEW XRAY OF THE CHEST 07/15/2023 02:30:02 AM COMPARISON: 06/24/2023 CLINICAL HISTORY: 200808 Hypoxia 295621. Hypoxia.ETT. NG tube. FINDINGS: LUNGS AND PLEURA: Mild patchy opacities in the bilateral upper lobes, new, worrisome for aspiration/pneumonia. No pleural effusion. No pneumothorax. HEART AND MEDIASTINUM: No acute abnormality of the cardiac and mediastinal silhouettes. BONES AND SOFT TISSUES: No acute osseous abnormality. Defibrillator pads overlying the lower hemithorax. LINES AND TUBES: Endotracheal tube is positioned 3.5 cm above the carina. Enteric tube courses below the diaphragm. IMPRESSION: 1. Mild patchy opacities in the bilateral upper lobes, new, worrisome for aspiration/pneumonia. 2. Support apparatus as above. Electronically signed by: Zadie Herter MD 07/19/2023 02:34 AM EDT RP Workstation:  [id number]   DG Abd Portable 1V Result Date: 06/24/2023 EXAM: 1 VIEW XRAY OF THE ABDOMEN SUPINE 06/24/2023 06:57:00 PM COMPARISON: None available. CLINICAL HISTORY: [id number] Encounter for imaging study to confirm nasogastric (NG) tube placement 413244. S/p NG tube placement. FINDINGS: BOWEL: Dilated small bowel and right colon, favoring adynamic ileus. PERITONEUM AND SOFT TISSUES: Cholecystectomy clips. BONES: Lumbar spine fixation hardware. No acute osseous abnormality. LINES AND TUBES: Enteric tube terminates in the gastric cardia. IMPRESSION: 1. Enteric tube terminates in the gastric cardia. 2. Dilated small bowel and right colon, favoring adynamic ileus. Electronically signed by: Zadie Herter MD 06/24/2023 07:05 PM EDT RP Workstation: [id number]   DG Abd Portable 1V Result Date: 06/24/2023 EXAM: 1 VIEW XRAY OF THE ABDOMEN SUPINE 06/24/2023 06:08:00 PM COMPARISON: 06/21/2023. CLINICAL HISTORY: [address] Ileus (HCC) [id number]. Severe abdominal pain. SOB. FINDINGS: BOWEL: Dilated small bowel in the central abdomen. Dilated right colon. Overall appearance favored adynamic ileus over a small bowel obstruction. PERITONEUM AND SOFT TISSUES: No abnormal calcifications. Laparoscopic band in satisfactory position. BONES: Lumbar spine fixation hardware. Bilateral hip arthroplasties. Right sacroiliac joint fusion. IMPRESSION: 1. Dilated small bowel and right colon, favoring adynamic ileus over small bowel obstruction. Electronically signed by: Zadie Herter MD 06/24/2023 07:04 PM EDT RP Workstation: [id number]   DG Chest Port 1 View Result Date: 06/24/2023 CLINICAL DATA:  Short of breath EXAM: PORTABLE CHEST 1 VIEW COMPARISON:  Prior chest x-ray 06/23/2023 FINDINGS: Stable cardiac and mediastinal contours. The right upper extremity PICC has been removed. Trace bibasilar atelectasis. No pulmonary edema, focal airspace infiltrate, pleural effusion or pneumothorax. Surgical changes of prior bilateral rotator cuff repair.  IMPRESSION: Stable cardiomegaly without evidence of acute cardiopulmonary process. Electronically Signed   By: Fernando Hoyer M.D.   On: 06/24/2023 18:54   US  EKG SITE RITE Result Date: 06/24/2023 If Site Rite image not attached, placement could not be confirmed due to current cardiac rhythm.  DG CHEST PORT 1 VIEW Result Date: 06/23/2023 CLINICAL DATA:  PICC placement EXAM: PORTABLE CHEST 1 VIEW COMPARISON:  None Available. FINDINGS: The heart size and mediastinal contours are within normal limits. No pneumothorax or pleural effusion. Linear bibasilar subsegmental atelectasis or scarring. There are thoracic degenerative changes. Thoracolumbar spine fixation hardware is partially imaged. Right-sided PICC tip proximal SVC. IMPRESSION: PICC tip proximal SVC. Electronically Signed   By: Sydell Eva M.D.   On: 06/23/2023 11:59     Scheduled Meds:    aspirin  EC  81 mg Oral Daily   Chlorhexidine  Gluconate Cloth  6 each Topical Daily   cholecalciferol   5,000 Units Oral Daily   famotidine  20 mg Per Tube BID   hydrocortisone sod  succinate (SOLU-CORTEF) inj  50 mg Intravenous Q6H   insulin  aspart  0-6 Units Subcutaneous Q4H   loratadine   10 mg Oral Daily   senna  1 tablet Oral BID   sodium chloride  flush  10-40 mL Intracatheter Q12H   tamsulosin   0.4 mg Oral QPM   topiramate   50 mg Oral BID    Continuous Infusions:    sodium chloride  20 mL/hr at 06/26/2023 0803   sodium chloride      albumin  human     amiodarone  60 mg/hr (07/12/2023 0803)   Followed by   amiodarone      dexmedetomidine (PRECEDEX) IV infusion Stopped (06/24/2023 0612)   epinephrine  20 mcg/min (07/10/2023 0803)   fentaNYL  infusion INTRAVENOUS Stopped (07/09/2023 0612)   norepinephrine (LEVOPHED) Adult infusion 2 mcg/min (07/22/2023 0803)   phenylephrine  (NEO-SYNEPHRINE) Adult infusion 200 mcg/min (07/20/2023 0805)   piperacillin-tazobactam (ZOSYN)  IV     vasopressin 0.04 Units/min (07/14/2023 0803)      LOS: 11 days      Aubrey Blas, MD,  FACP, [facility], [facility], [facility]   [facility] [place]      To contact the attending provider between 7A-7P or the covering provider during after hours 7P-7A, please log into the web site [email or web address] and access using universal [place] password for that web site. If you do not have the password, please call the hospital operator.  07/07/2023, 8:23 AM

## 2023-07-24 NOTE — IPAL (Signed)
  Interdisciplinary Goals of Care Family Meeting   Date carried out: 07/10/2023  Location of the meeting: Bedside  Member's involved: Nurse Practitioner, Bedside Registered Nurse, and Family Member or next of kin  Durable Power of Attorney or acting medical decision maker: wife    Discussion: We discussed goals of care for Carlos Dawson .  Have spoken w wife sons and grandchildren at various times today. Most recently sons asked to meet with them + pt wife to discuss goals of care, and what comfort focussed care would entail  A decision was reached to transition to comfort care Will add morphine gtt + PRN versed  Dc labs, non comfort interventions When comfortable on PSV/CPAP + family ready, extubate After extubation dc pressors  Anticipate in-hospital death -- minutes to hours  Code status:   Code Status: Do not attempt resuscitation (DNR) - Comfort care   Disposition: In-patient comfort care  Time spent for the meeting: 70 min     Delories Fetter, NP  07/09/2023, 2:56 PM

## 2023-07-24 NOTE — Progress Notes (Signed)
 RT attempted x3 to obtain Arterial Line without success. CCM aware.

## 2023-07-24 NOTE — Progress Notes (Signed)
 OT Cancellation Note  Patient Details Name: KEALAN BUCHAN MRN: [id number] DOB: 24-Aug-1940   Cancelled Treatment:    Reason Eval/Treat Not Completed: Medical issues which prohibited therapy (Pt with PEA arrest this date, CPR and pt intubated. Will continue to follow.)  Jonette Nestle 07/11/2023, 2:45 PM Avanell Leigh, OTR/L Acute Rehabilitation Services Office: [phone]

## 2023-07-24 NOTE — Progress Notes (Signed)
 PHARMACY - TOTAL PARENTERAL NUTRITION CONSULT NOTE   Indication: Prolonged ileus  Patient Measurements: Height: 5' 5.5" (166.4 cm) Weight: 107.3 kg (236 lb 8 oz) IBW/kg (Calculated) : 62.65 TPN AdjBW (KG): 75.6 Body mass index is 38.76 kg/m. Usual Weight: 235lb on 05/29/23   Assessment:  83 YO male admitted by the orthopedic service on 5/19 after L hip total arthroplasty. Continued admission given concern for hypotension, tachycardia, and dyspnea with exertion (cardiology following). Per 5/24 KUB, likely ileus. Per MD, patient has not had adequate nutrition x3 days post-op. Pharmacy consulted for TPN management   Glucose / Insulin : Hx of T2DM on PTA Jardiance  - CBGs increasing.  CBGs 122-253 in last 24hrs - 8 units of moderate SSI / 24hrs - 6/2 started hydrocortisone 50mg  IV q6h   Electrolytes: Phos elevated at 9, others WNL: Na, K, Mag, CorrCa 9.06 - Previous hypoK likely due to excessive bowel movements - Cl elevated at 113, Co2 low at 19 (improved from 16)  Renal: Scr improved 0.85 (BL 1-1.10), now elevated to 1.5 s/p code, BUN 40 Hepatic: albumin  low 2.3, AST elevated, Tbili and alk phos WNL.   - Ammonia 37 (6/2) Intake / Output; MIVF:  - IVF: NS @ 20 ml/hr, Pressors (Epi, Norepi, Phenylephrine , Vasopressin) - Intake: NPO - Output: NG 950 mL, stool x3, Urine 50 mL + occurrence x2 GI Imaging: - 5/24 DG abd: Diffuse gaseous distention of the colon most consistent with ileus. No evidence of small-bowel obstruction. - 5/26 DG abd: Dilated loops of small bowel up to 5.2 cm in diameter. Gaseous prominence of the colon. Appearance could be from ileus or bowel obstruction. - 5/27 CT a/p: dilated small bowel, R colon, and transverse colon >> could reflect distal SBO or ileus.  - 5/29 DG abd: slight improvement in small bowel distension  - 6/2 CT: likely ileus, no obstruction or perforation noted GI Surgeries / Procedures: N/A  Central access: 5/26 TPN start date: 5/27-5/31, held  6/1, restarted 6/2  Nutritional Goals: Goal TPN rate is 80 mL/hr (provides 99.8 g of protein and 2047 kcals per day)  RD Assessment: Estimated Needs Total Energy Estimated Needs: 1900-2100 kcals Total Protein Estimated Needs: 95-115 grams Total Fluid Estimated Needs: >/= 1.9L - Pharmacy targeting low end of fluid requirement (1920 mL/day) given patient's history of HF and significant volume overload post-op  Current Nutrition:  NPO and TPN  Plan:  At 1800:  Restart TPN at 35mL/hr Electrolytes in TPN:  Na 50 mEq/L K 20 mEq/L Ca 5 mEq/L Mg 3 mEq/L Phos 0 mmol/L  Cl:Ac max acetate Add standard MVI and trace elements to TPN Increase to resistant SSI q4h and adjust as needed  Monitor TPN labs on Mon/Thurs, and PRN   Kendall Pauls PharmD, BCPS [facility] main pharmacy [phone] 06/27/2023 8:01 AM

## 2023-07-24 NOTE — IPAL (Signed)
  Interdisciplinary Goals of Care Family Meeting   Date carried out: 07/06/2023  Location of the meeting: Bedside  Member's involved: Nurse Practitioner, Bedside Registered Nurse, and Family Member or next of kin  Durable Power of Attorney or acting medical decision maker: wife     Discussion: We discussed goals of care for Carlos Dawson .    Very tenuous, critically ill. Talked about ddx plan of care. Family raised query of code status.We talked about what each code status means. Decision reached to change code status to DNR; pre arrest interventions desired.   Essentially, will do any/every offered aggressive thing to try to help him through this however in the event that his heart stops we will not resuscitate.   Code status:   Code Status: Do not attempt resuscitation (DNR) PRE-ARREST INTERVENTIONS DESIRED   Disposition: Continue current acute care  Time spent for the meeting: 31 min    Eston Hence MSN, AGACNP-BC [facility] Amion for pager 07/20/2023, 8:14 AM

## 2023-07-24 NOTE — Plan of Care (Signed)
  Problem: Clinical Measurements: Goal: Respiratory complications will improve Outcome: Not Progressing Goal: Cardiovascular complication will be avoided Outcome: Not Progressing   Problem: Activity: Goal: Risk for activity intolerance will decrease Outcome: Not Progressing   Problem: Safety: Goal: Ability to remain free from injury will improve Outcome: Not Progressing   Problem: Health Behavior/Discharge Planning: Goal: Ability to manage health-related needs will improve Outcome: Not Progressing   Problem: Clinical Measurements: Goal: Ability to maintain clinical measurements within normal limits will improve Outcome: Not Progressing   Problem: Pain Managment: Goal: General experience of comfort will improve and/or be controlled Outcome: Not Progressing

## 2023-07-24 NOTE — Procedures (Signed)
 Extubation Procedure Note  Patient Details:   Name: VIC ESCO DOB: 20-Sep-1940 MRN: [id number]   Airway Documentation:    Vent end date: 07/09/2023 Vent end time: 1735   Evaluation  O2 sats: currently acceptable Complications: No apparent complications Patient did tolerate procedure well. Bilateral Breath Sounds: Diminished, Coarse crackles   No  Avanell Leigh 07/08/2023, 5:38 PM  Comfort Care

## 2023-07-24 NOTE — Progress Notes (Addendum)
 Late entry for 0030-   Contacted ARNP coverage for [facility] about patient's restlessness, possible delirium and elevated HR. Patient's son was wanting something to help the patient "rest" and "relax." Patient was picking at items in the air and when asked what he was doing patient said he was "placing dinner orders." Tried to Reoriented with minimal effectiveness. He continued to pick at his spo2 probe and leads. Patient's son at bedside offering comforting presence and redirection.   Spoke with provider about plan of care, patient's elevated HR, and administration of pain medication as his restlessness could be related to pain. Provider suggested giving dilaudid  IV to promote comfort and pain control, as well as assist with the patient's increased Work of breathing. Administered dilaudid  and 5 min after administration patient started experiencing desaturations into 70's. Placed on a NRB at 15L and sats improved to 95%. HR continued to remain elevated with rate of 150-160's AFIB RVR and discussed starting Cardizem drip since pain medication and treatment of pain did not assist in helping lower heart rate.  I was then called into the room around 0110 for reports of abnormal jerking motions and lack of responsiveness. Patient was given Narcan per verbal order from ARNP. He was also assisted to his side for what appeared to be seizure like activity and to protect airway. AS ARNP was at the beside consulting with Neurology, Patient became bradycardic which then progressed to PEA and CPR was started.   See provider notes for details on Cardiac Arrest and code blue documentation.

## 2023-07-24 DEATH — deceased
